# Patient Record
Sex: Female | Born: 1975 | Race: Black or African American | Hispanic: No | Marital: Single | Smoking: Never smoker
Health system: Southern US, Community
[De-identification: ages and names within clinical notes are randomized; demographics above are authoritative.]

## PROBLEM LIST (undated history)

## (undated) DIAGNOSIS — K589 Irritable bowel syndrome without diarrhea: Secondary | ICD-10-CM

## (undated) DIAGNOSIS — G459 Transient cerebral ischemic attack, unspecified: Secondary | ICD-10-CM

## (undated) DIAGNOSIS — D649 Anemia, unspecified: Secondary | ICD-10-CM

## (undated) DIAGNOSIS — K219 Gastro-esophageal reflux disease without esophagitis: Secondary | ICD-10-CM

## (undated) DIAGNOSIS — R51 Headache: Secondary | ICD-10-CM

## (undated) DIAGNOSIS — R519 Headache, unspecified: Secondary | ICD-10-CM

## (undated) DIAGNOSIS — I471 Supraventricular tachycardia, unspecified: Secondary | ICD-10-CM

## (undated) DIAGNOSIS — I10 Essential (primary) hypertension: Secondary | ICD-10-CM

## (undated) DIAGNOSIS — I499 Cardiac arrhythmia, unspecified: Secondary | ICD-10-CM

## (undated) DIAGNOSIS — Z9889 Other specified postprocedural states: Secondary | ICD-10-CM

## (undated) DIAGNOSIS — Z9289 Personal history of other medical treatment: Secondary | ICD-10-CM

## (undated) DIAGNOSIS — F419 Anxiety disorder, unspecified: Secondary | ICD-10-CM

## (undated) DIAGNOSIS — N186 End stage renal disease: Secondary | ICD-10-CM

## (undated) DIAGNOSIS — R0602 Shortness of breath: Secondary | ICD-10-CM

## (undated) DIAGNOSIS — R079 Chest pain, unspecified: Secondary | ICD-10-CM

## (undated) DIAGNOSIS — Z992 Dependence on renal dialysis: Secondary | ICD-10-CM

## (undated) DIAGNOSIS — R011 Cardiac murmur, unspecified: Secondary | ICD-10-CM

## (undated) DIAGNOSIS — E7800 Pure hypercholesterolemia, unspecified: Secondary | ICD-10-CM

## (undated) DIAGNOSIS — M329 Systemic lupus erythematosus, unspecified: Secondary | ICD-10-CM

## (undated) DIAGNOSIS — I639 Cerebral infarction, unspecified: Secondary | ICD-10-CM

## (undated) DIAGNOSIS — R55 Syncope and collapse: Secondary | ICD-10-CM

## (undated) DIAGNOSIS — M1990 Unspecified osteoarthritis, unspecified site: Secondary | ICD-10-CM

## (undated) DIAGNOSIS — M797 Fibromyalgia: Secondary | ICD-10-CM

## (undated) DIAGNOSIS — G4730 Sleep apnea, unspecified: Secondary | ICD-10-CM

## (undated) HISTORY — DX: Systemic lupus erythematosus, unspecified: M32.9

---

## 2002-10-18 ENCOUNTER — Emergency Department (HOSPITAL_COMMUNITY): Admission: EM | Admit: 2002-10-18 | Discharge: 2002-10-18 | Payer: Self-pay | Admitting: Emergency Medicine

## 2003-09-05 ENCOUNTER — Emergency Department (HOSPITAL_COMMUNITY): Admission: EM | Admit: 2003-09-05 | Discharge: 2003-09-05 | Payer: Self-pay | Admitting: Family Medicine

## 2005-03-18 ENCOUNTER — Emergency Department (HOSPITAL_COMMUNITY): Admission: EM | Admit: 2005-03-18 | Discharge: 2005-03-19 | Payer: Self-pay | Admitting: Emergency Medicine

## 2006-09-18 ENCOUNTER — Emergency Department (HOSPITAL_COMMUNITY): Admission: EM | Admit: 2006-09-18 | Discharge: 2006-09-18 | Payer: Self-pay | Admitting: Emergency Medicine

## 2007-11-18 ENCOUNTER — Inpatient Hospital Stay (HOSPITAL_COMMUNITY): Admission: AD | Admit: 2007-11-18 | Discharge: 2007-11-18 | Payer: Self-pay | Admitting: Obstetrics & Gynecology

## 2007-12-24 ENCOUNTER — Emergency Department (HOSPITAL_COMMUNITY): Admission: EM | Admit: 2007-12-24 | Discharge: 2007-12-24 | Payer: Self-pay | Admitting: Emergency Medicine

## 2008-03-19 ENCOUNTER — Inpatient Hospital Stay (HOSPITAL_COMMUNITY): Admission: AD | Admit: 2008-03-19 | Discharge: 2008-03-20 | Payer: Self-pay | Admitting: Obstetrics

## 2008-03-30 HISTORY — PX: TUBAL LIGATION: SHX77

## 2008-06-14 ENCOUNTER — Ambulatory Visit: Payer: Self-pay | Admitting: Obstetrics and Gynecology

## 2008-06-14 ENCOUNTER — Inpatient Hospital Stay (HOSPITAL_COMMUNITY): Admission: AD | Admit: 2008-06-14 | Discharge: 2008-06-14 | Payer: Self-pay | Admitting: Obstetrics

## 2008-06-22 ENCOUNTER — Encounter (INDEPENDENT_AMBULATORY_CARE_PROVIDER_SITE_OTHER): Payer: Self-pay | Admitting: Obstetrics

## 2008-06-22 ENCOUNTER — Inpatient Hospital Stay (HOSPITAL_COMMUNITY): Admission: AD | Admit: 2008-06-22 | Discharge: 2008-06-25 | Payer: Self-pay | Admitting: Obstetrics

## 2008-07-21 ENCOUNTER — Emergency Department (HOSPITAL_COMMUNITY): Admission: EM | Admit: 2008-07-21 | Discharge: 2008-07-21 | Payer: Self-pay | Admitting: Family Medicine

## 2009-03-07 ENCOUNTER — Emergency Department (HOSPITAL_COMMUNITY): Admission: EM | Admit: 2009-03-07 | Discharge: 2009-03-07 | Payer: Self-pay | Admitting: Family Medicine

## 2009-03-30 HISTORY — PX: APPENDECTOMY: SHX54

## 2009-08-21 ENCOUNTER — Emergency Department (HOSPITAL_COMMUNITY): Admission: EM | Admit: 2009-08-21 | Discharge: 2009-08-21 | Payer: Self-pay | Admitting: Family Medicine

## 2009-08-21 ENCOUNTER — Ambulatory Visit (HOSPITAL_COMMUNITY): Admission: EM | Admit: 2009-08-21 | Discharge: 2009-08-22 | Payer: Self-pay | Admitting: Emergency Medicine

## 2009-09-03 ENCOUNTER — Emergency Department (HOSPITAL_COMMUNITY): Admission: EM | Admit: 2009-09-03 | Discharge: 2009-09-04 | Payer: Self-pay | Admitting: Emergency Medicine

## 2010-01-16 ENCOUNTER — Emergency Department (HOSPITAL_COMMUNITY): Admission: EM | Admit: 2010-01-16 | Discharge: 2010-01-16 | Payer: Self-pay | Admitting: Emergency Medicine

## 2010-03-06 ENCOUNTER — Emergency Department (HOSPITAL_COMMUNITY): Admission: EM | Admit: 2010-03-06 | Discharge: 2009-09-08 | Payer: Self-pay | Admitting: Emergency Medicine

## 2010-04-01 ENCOUNTER — Emergency Department (HOSPITAL_COMMUNITY)
Admission: EM | Admit: 2010-04-01 | Discharge: 2010-04-01 | Payer: Self-pay | Source: Home / Self Care | Admitting: Family Medicine

## 2010-04-28 ENCOUNTER — Emergency Department (HOSPITAL_COMMUNITY)
Admission: EM | Admit: 2010-04-28 | Discharge: 2010-04-28 | Payer: Self-pay | Source: Home / Self Care | Admitting: Family Medicine

## 2010-05-01 ENCOUNTER — Inpatient Hospital Stay (INDEPENDENT_AMBULATORY_CARE_PROVIDER_SITE_OTHER)
Admission: RE | Admit: 2010-05-01 | Discharge: 2010-05-01 | Disposition: A | Discharge status: Home / Self Care | Payer: 59 | Source: Ambulatory Visit | Attending: Emergency Medicine | Admitting: Emergency Medicine

## 2010-05-01 DIAGNOSIS — H9209 Otalgia, unspecified ear: Secondary | ICD-10-CM

## 2010-06-05 ENCOUNTER — Emergency Department (HOSPITAL_COMMUNITY): Payer: 59

## 2010-06-05 ENCOUNTER — Emergency Department (HOSPITAL_COMMUNITY)
Admission: EM | Admit: 2010-06-05 | Discharge: 2010-06-05 | Disposition: A | Discharge status: Home / Self Care | Payer: 59 | Attending: Emergency Medicine | Admitting: Emergency Medicine

## 2010-06-05 DIAGNOSIS — R0989 Other specified symptoms and signs involving the circulatory and respiratory systems: Secondary | ICD-10-CM | POA: Insufficient documentation

## 2010-06-05 DIAGNOSIS — F411 Generalized anxiety disorder: Secondary | ICD-10-CM | POA: Insufficient documentation

## 2010-06-05 DIAGNOSIS — R002 Palpitations: Secondary | ICD-10-CM | POA: Insufficient documentation

## 2010-06-05 DIAGNOSIS — Z79899 Other long term (current) drug therapy: Secondary | ICD-10-CM | POA: Insufficient documentation

## 2010-06-05 DIAGNOSIS — R079 Chest pain, unspecified: Secondary | ICD-10-CM | POA: Insufficient documentation

## 2010-06-05 DIAGNOSIS — F410 Panic disorder [episodic paroxysmal anxiety] without agoraphobia: Secondary | ICD-10-CM | POA: Insufficient documentation

## 2010-06-05 DIAGNOSIS — R0602 Shortness of breath: Secondary | ICD-10-CM | POA: Insufficient documentation

## 2010-06-05 DIAGNOSIS — M542 Cervicalgia: Secondary | ICD-10-CM | POA: Insufficient documentation

## 2010-06-05 DIAGNOSIS — I10 Essential (primary) hypertension: Secondary | ICD-10-CM | POA: Insufficient documentation

## 2010-06-05 DIAGNOSIS — M545 Low back pain, unspecified: Secondary | ICD-10-CM | POA: Insufficient documentation

## 2010-06-05 DIAGNOSIS — R55 Syncope and collapse: Secondary | ICD-10-CM | POA: Insufficient documentation

## 2010-06-05 DIAGNOSIS — R0609 Other forms of dyspnea: Secondary | ICD-10-CM | POA: Insufficient documentation

## 2010-06-05 DIAGNOSIS — R259 Unspecified abnormal involuntary movements: Secondary | ICD-10-CM | POA: Insufficient documentation

## 2010-06-05 LAB — POCT PREGNANCY, URINE: Preg Test, Ur: NEGATIVE

## 2010-06-05 LAB — CBC
HCT: 34.7 % — ABNORMAL LOW (ref 36.0–46.0)
Hemoglobin: 11.3 g/dL — ABNORMAL LOW (ref 12.0–15.0)
MCH: 28.6 pg (ref 26.0–34.0)
MCHC: 32.6 g/dL (ref 30.0–36.0)
MCV: 87.8 fL (ref 78.0–100.0)
Platelets: 160 10*3/uL (ref 150–400)
RBC: 3.95 MIL/uL (ref 3.87–5.11)
RDW: 15.3 % (ref 11.5–15.5)
WBC: 2.8 10*3/uL — ABNORMAL LOW (ref 4.0–10.5)

## 2010-06-05 LAB — DIFFERENTIAL
Basophils Absolute: 0 10*3/uL (ref 0.0–0.1)
Basophils Relative: 1 % (ref 0–1)
Eosinophils Absolute: 0.1 10*3/uL (ref 0.0–0.7)
Eosinophils Relative: 2 % (ref 0–5)
Lymphocytes Relative: 26 % (ref 12–46)
Lymphs Abs: 0.7 10*3/uL (ref 0.7–4.0)
Monocytes Absolute: 0.2 10*3/uL (ref 0.1–1.0)
Monocytes Relative: 8 % (ref 3–12)
Neutro Abs: 1.8 10*3/uL (ref 1.7–7.7)
Neutrophils Relative %: 63 % (ref 43–77)

## 2010-06-05 LAB — POCT I-STAT, CHEM 8
BUN: 8 mg/dL (ref 6–23)
Calcium, Ion: 1.18 mmol/L (ref 1.12–1.32)
Chloride: 103 mEq/L (ref 96–112)
Creatinine, Ser: 0.9 mg/dL (ref 0.4–1.2)
Glucose, Bld: 96 mg/dL (ref 70–99)
HCT: 35 % — ABNORMAL LOW (ref 36.0–46.0)
Hemoglobin: 11.9 g/dL — ABNORMAL LOW (ref 12.0–15.0)
Potassium: 3.6 mEq/L (ref 3.5–5.1)
Sodium: 141 mEq/L (ref 135–145)
TCO2: 26 mmol/L (ref 0–100)

## 2010-06-11 LAB — POCT I-STAT, CHEM 8
BUN: 7 mg/dL (ref 6–23)
Calcium, Ion: 1.15 mmol/L (ref 1.12–1.32)
Chloride: 108 mEq/L (ref 96–112)
Creatinine, Ser: 0.9 mg/dL (ref 0.4–1.2)
Glucose, Bld: 100 mg/dL — ABNORMAL HIGH (ref 70–99)
HCT: 37 % (ref 36.0–46.0)
Hemoglobin: 12.6 g/dL (ref 12.0–15.0)
Potassium: 4.2 mEq/L (ref 3.5–5.1)
Sodium: 143 mEq/L (ref 135–145)
TCO2: 25 mmol/L (ref 0–100)

## 2010-06-11 LAB — CBC
HCT: 34.4 % — ABNORMAL LOW (ref 36.0–46.0)
Hemoglobin: 10.9 g/dL — ABNORMAL LOW (ref 12.0–15.0)
MCH: 25.4 pg — ABNORMAL LOW (ref 26.0–34.0)
MCHC: 31.7 g/dL (ref 30.0–36.0)
MCV: 80.2 fL (ref 78.0–100.0)
Platelets: 242 10*3/uL (ref 150–400)
RBC: 4.29 MIL/uL (ref 3.87–5.11)
RDW: 17.7 % — ABNORMAL HIGH (ref 11.5–15.5)
WBC: 2.8 10*3/uL — ABNORMAL LOW (ref 4.0–10.5)

## 2010-06-11 LAB — RAPID URINE DRUG SCREEN, HOSP PERFORMED
Amphetamines: NOT DETECTED
Barbiturates: NOT DETECTED
Benzodiazepines: NOT DETECTED
Cocaine: NOT DETECTED
Opiates: NOT DETECTED
Tetrahydrocannabinol: NOT DETECTED

## 2010-06-11 LAB — POCT CARDIAC MARKERS
CKMB, poc: 6.1 ng/mL (ref 1.0–8.0)
CKMB, poc: 7.7 ng/mL (ref 1.0–8.0)
Myoglobin, poc: 235 ng/mL (ref 12–200)
Myoglobin, poc: 270 ng/mL (ref 12–200)
Troponin i, poc: <0.05 ng/mL (ref 0.00–0.09)
Troponin i, poc: <0.05 ng/mL (ref 0.00–0.09)

## 2010-06-11 LAB — D-DIMER, QUANTITATIVE: D-Dimer, Quant: 0.53 ug/mL-FEU — ABNORMAL HIGH (ref 0.00–0.48)

## 2010-06-16 LAB — URINALYSIS, ROUTINE W REFLEX MICROSCOPIC
Bilirubin Urine: NEGATIVE
Glucose, UA: NEGATIVE mg/dL
Glucose, UA: NEGATIVE mg/dL
Ketones, ur: 15 mg/dL — AB
Leukocytes, UA: NEGATIVE
Nitrite: NEGATIVE
Nitrite: NEGATIVE
Protein, ur: NEGATIVE mg/dL
Specific Gravity, Urine: 1.013 (ref 1.005–1.030)
Specific Gravity, Urine: 1.026 (ref 1.005–1.030)
Urobilinogen, UA: 1 mg/dL (ref 0.0–1.0)
pH: 5.5 (ref 5.0–8.0)
pH: 6.5 (ref 5.0–8.0)

## 2010-06-16 LAB — COMPREHENSIVE METABOLIC PANEL
Albumin: 3.8 g/dL (ref 3.5–5.2)
Alkaline Phosphatase: 50 U/L (ref 39–117)
Alkaline Phosphatase: 48 U/L (ref 39–117)
BUN: 7 mg/dL (ref 6–23)
BUN: 8 mg/dL (ref 6–23)
CO2: 24 mEq/L (ref 19–32)
CO2: 24 mEq/L (ref 19–32)
Chloride: 106 mEq/L (ref 96–112)
Chloride: 106 mEq/L (ref 96–112)
Creatinine, Ser: 0.71 mg/dL (ref 0.4–1.2)
GFR calc non Af Amer: >60 mL/min (ref >60)
GFR calc non Af Amer: >60 mL/min (ref >60)
Glucose, Bld: 100 mg/dL — ABNORMAL HIGH (ref 70–99)
Potassium: 3.2 mEq/L — ABNORMAL LOW (ref 3.5–5.1)
Potassium: 3.1 mEq/L — ABNORMAL LOW (ref 3.5–5.1)
Total Bilirubin: 0.6 mg/dL (ref 0.3–1.2)
Total Bilirubin: 0.5 mg/dL (ref 0.3–1.2)

## 2010-06-16 LAB — DIFFERENTIAL
Basophils Absolute: 0 10*3/uL (ref 0.0–0.1)
Basophils Absolute: 0.1 10*3/uL (ref 0.0–0.1)
Basophils Absolute: 0 10*3/uL (ref 0.0–0.1)
Basophils Relative: 1 % (ref 0–1)
Basophils Relative: 0 % (ref 0–1)
Eosinophils Relative: 1 % (ref 0–5)
Eosinophils Relative: 0 % (ref 0–5)
Lymphocytes Relative: 24 % (ref 12–46)
Lymphocytes Relative: 8 % — ABNORMAL LOW (ref 12–46)
Lymphs Abs: 0.4 10*3/uL — ABNORMAL LOW (ref 0.7–4.0)
Monocytes Absolute: 0.4 10*3/uL (ref 0.1–1.0)
Monocytes Absolute: 0.3 10*3/uL (ref 0.1–1.0)
Neutro Abs: 3 10*3/uL (ref 1.7–7.7)
Neutro Abs: 4.4 10*3/uL (ref 1.7–7.7)
Neutro Abs: 5.4 10*3/uL (ref 1.7–7.7)
Neutrophils Relative %: 64 % (ref 43–77)

## 2010-06-16 LAB — CBC
HCT: 33.4 % — ABNORMAL LOW (ref 36.0–46.0)
HCT: 33.6 % — ABNORMAL LOW (ref 36.0–46.0)
HCT: 30.7 % — ABNORMAL LOW (ref 36.0–46.0)
Hemoglobin: 11.1 g/dL — ABNORMAL LOW (ref 12.0–15.0)
Hemoglobin: 11.2 g/dL — ABNORMAL LOW (ref 12.0–15.0)
Hemoglobin: 10.3 g/dL — ABNORMAL LOW (ref 12.0–15.0)
MCV: 79.6 fL (ref 78.0–100.0)
MCV: 79.4 fL (ref 78.0–100.0)
Platelets: 366 10*3/uL (ref 150–400)
Platelets: 488 10*3/uL — ABNORMAL HIGH (ref 150–400)
Platelets: 292 10*3/uL (ref 150–400)
RBC: 4.19 MIL/uL (ref 3.87–5.11)
RBC: 3.87 MIL/uL (ref 3.87–5.11)
RDW: 16.3 % — ABNORMAL HIGH (ref 11.5–15.5)
WBC: 4.6 10*3/uL (ref 4.0–10.5)
WBC: 5.4 10*3/uL (ref 4.0–10.5)
WBC: 6.1 10*3/uL (ref 4.0–10.5)

## 2010-06-16 LAB — PREGNANCY, URINE: Preg Test, Ur: NEGATIVE

## 2010-06-16 LAB — BASIC METABOLIC PANEL
Calcium: 9 mg/dL (ref 8.4–10.5)
GFR calc non Af Amer: >60 mL/min (ref >60)
Glucose, Bld: 105 mg/dL — ABNORMAL HIGH (ref 70–99)
Potassium: 3.3 mEq/L — ABNORMAL LOW (ref 3.5–5.1)
Sodium: 135 mEq/L (ref 135–145)

## 2010-06-16 LAB — CULTURE, ROUTINE-ABSCESS

## 2010-06-16 LAB — URINE MICROSCOPIC-ADD ON

## 2010-06-16 LAB — POCT URINALYSIS DIP (DEVICE)
Bilirubin Urine: NEGATIVE
Glucose, UA: NEGATIVE mg/dL
Hgb urine dipstick: NEGATIVE
Ketones, ur: NEGATIVE mg/dL
Specific Gravity, Urine: >=1.03 (ref 1.005–1.030)
Urobilinogen, UA: 0.2 mg/dL (ref 0.0–1.0)

## 2010-06-16 LAB — GRAM STAIN

## 2010-06-16 LAB — LIPASE, BLOOD: Lipase: 20 U/L (ref 11–59)

## 2010-06-16 LAB — POCT PREGNANCY, URINE: Preg Test, Ur: NEGATIVE

## 2010-07-10 LAB — CBC
MCHC: 32.5 g/dL (ref 30.0–36.0)
MCV: 81.2 fL (ref 78.0–100.0)
Platelets: 318 10*3/uL (ref 150–400)
Platelets: 387 10*3/uL (ref 150–400)
RDW: 19.1 % — ABNORMAL HIGH (ref 11.5–15.5)
WBC: 8.2 10*3/uL (ref 4.0–10.5)

## 2010-07-10 LAB — RPR: RPR Ser Ql: NONREACTIVE

## 2010-08-12 NOTE — Discharge Summary (Signed)
NAMEVALLIE, FAYETTE NO.:  [id number]   MEDICAL RECORD NO.:  [id number]          PATIENT TYPE:  INP   LOCATION:  9121                          FACILITY:  [facility]   PHYSICIAN:  Kathreen Cosier, M.D.DATE OF BIRTH:  [birth year]   DATE OF ADMISSION:  06/22/2008  DATE OF DISCHARGE:  06/25/2008                               DISCHARGE SUMMARY   The patient is a 35 year old gravida 3, para 1-0-1-1 had a previous C-  section.  [facility] July 09, 2008.  She was admitted in labor, desired repeat  C-section and tubal ligation.  She underwent repeat C-section and had a  female 6 pounds 4 ounces, Apgar 9 and 9.  She also had postpartum tubal  ligation done.  Postoperatively, she did well.  Her hemoglobin was 8.6.  RPR negative.  HIV negative.  She was discharged on the third  postoperative day ambulatory and on a regular diet.  To see me in 6  weeks.   DISCHARGE DIAGNOSES:  Status post repeat low transverse cesarean section  and tubal ligation.           ______________________________  Kathreen Cosier, M.D.     BAM/MEDQ  D:  06/25/2008  T:  06/25/2008  Job:  [id number]

## 2010-08-12 NOTE — Op Note (Signed)
[name], CREEGAN NO.:  [id number]   MEDICAL RECORD NO.:  [id number]          PATIENT TYPE:  INP   LOCATION:  9121                          FACILITY:  [facility]   PHYSICIAN:  Kathreen Cosier, M.D.DATE OF BIRTH:  1975-10-25   DATE OF PROCEDURE:  06/22/2008  DATE OF DISCHARGE:                               OPERATIVE REPORT   PREOPERATIVE DIAGNOSIS:  Previous cesarean section at term in labor,  desires repeat and tubal ligation for multiparity.   SURGEON:  Kathreen Cosier, MD   FIRST ASSISTANT:  Charles A. Clearance Coots, MD   ANESTHESIA:  Spinal.   PROCEDURE:  The patient was placed in the operating table in supine  position after the spinal administered.  Abdomen was prepped and draped.  Bladder was emptied with Foley catheter.  Transverse suprapubic incision  was made through the old scar, carried down to the rectus fascia.  Fascia incised at the length of incision.  Recti muscles were retracted  laterally.  Peritoneum was incised longitudinally.  Transverse incision  made at visceral peritoneum above the bladder.  Bladder mobilized  inferiorly.  Transverse lower uterine incision made.  The fluid was  clear.  The patient delivered from the LOA position of a female, Apgars  9 and 9, weighing 6 pounds 4 ounces.  The team was in attendance.  Fluid  was clear.  Placenta was posterior and removed manually, and sent to  Labor and Delivery.  Uterine cavity was cleaned with dry laps.  Uterine  incision closed in one layer with continuous suture of #1 chromic  including  peritoneum and myometrium.  Hemostasis was satisfactory.  Right tube grasped at the mid portion with Babcock clamp, 0 plain suture  placed at the mesosalpinx below the portion of the tube and this clamp.  This was tied with and approximately, 1 inch of tube transected.  Procedure was done in the similar fashion on the other side.  Lap and  sponge counts correct.  Abdomen was closed in layers.   Peritoneum  continuous suture of 0 chromic, fascia continuous suture of 0 Dexon.  Skin closed with subcuticular stitch of 4-0 Monocryl.  Blood loss 600  mL.           ______________________________  Kathreen Cosier, M.D.     BAM/MEDQ  D:  06/22/2008  T:  06/23/2008  Job:  [id number]

## 2010-08-24 ENCOUNTER — Emergency Department (HOSPITAL_COMMUNITY): Payer: 59

## 2010-08-24 ENCOUNTER — Emergency Department (HOSPITAL_COMMUNITY)
Admission: EM | Admit: 2010-08-24 | Discharge: 2010-08-24 | Disposition: A | Discharge status: Home / Self Care | Payer: 59 | Attending: Emergency Medicine | Admitting: Emergency Medicine

## 2010-08-24 DIAGNOSIS — R0602 Shortness of breath: Secondary | ICD-10-CM | POA: Insufficient documentation

## 2010-08-24 DIAGNOSIS — R002 Palpitations: Secondary | ICD-10-CM | POA: Insufficient documentation

## 2010-08-24 DIAGNOSIS — R0789 Other chest pain: Secondary | ICD-10-CM | POA: Insufficient documentation

## 2010-08-24 DIAGNOSIS — R42 Dizziness and giddiness: Secondary | ICD-10-CM | POA: Insufficient documentation

## 2010-08-24 DIAGNOSIS — A599 Trichomoniasis, unspecified: Secondary | ICD-10-CM | POA: Insufficient documentation

## 2010-08-24 DIAGNOSIS — I10 Essential (primary) hypertension: Secondary | ICD-10-CM | POA: Insufficient documentation

## 2010-08-24 DIAGNOSIS — R55 Syncope and collapse: Secondary | ICD-10-CM | POA: Insufficient documentation

## 2010-08-24 DIAGNOSIS — I517 Cardiomegaly: Secondary | ICD-10-CM | POA: Insufficient documentation

## 2010-08-24 LAB — CK TOTAL AND CKMB (NOT AT ARMC)
CK, MB: 22 ng/mL (ref 0.3–4.0)
Relative Index: 1.7 (ref 0.0–2.5)
Total CK: 1298 U/L — ABNORMAL HIGH (ref 7–177)

## 2010-08-24 LAB — CBC
HCT: 31.6 % — ABNORMAL LOW (ref 36.0–46.0)
Hemoglobin: 10.6 g/dL — ABNORMAL LOW (ref 12.0–15.0)
RBC: 3.52 MIL/uL — ABNORMAL LOW (ref 3.87–5.11)
RDW: 14 % (ref 11.5–15.5)
WBC: 2.9 10*3/uL — ABNORMAL LOW (ref 4.0–10.5)

## 2010-08-24 LAB — URINE MICROSCOPIC-ADD ON

## 2010-08-24 LAB — URINALYSIS, ROUTINE W REFLEX MICROSCOPIC
Glucose, UA: NEGATIVE mg/dL
Specific Gravity, Urine: 1.01 (ref 1.005–1.030)
Urobilinogen, UA: 1 mg/dL (ref 0.0–1.0)

## 2010-08-24 LAB — MAGNESIUM: Magnesium: 1.9 mg/dL (ref 1.5–2.5)

## 2010-08-24 LAB — DIFFERENTIAL
Basophils Absolute: 0 10*3/uL (ref 0.0–0.1)
Lymphocytes Relative: 24 % (ref 12–46)
Neutro Abs: 1.8 10*3/uL (ref 1.7–7.7)

## 2010-08-24 LAB — COMPREHENSIVE METABOLIC PANEL
ALT: 39 U/L — ABNORMAL HIGH (ref 0–35)
BUN: 9 mg/dL (ref 6–23)
Calcium: 9 mg/dL (ref 8.4–10.5)
Glucose, Bld: 103 mg/dL — ABNORMAL HIGH (ref 70–99)
Sodium: 138 mEq/L (ref 135–145)
Total Protein: 7.7 g/dL (ref 6.0–8.3)

## 2010-08-24 LAB — TROPONIN I: Troponin I: <0.3 ng/mL (ref <0.30)

## 2010-12-04 ENCOUNTER — Emergency Department (HOSPITAL_COMMUNITY)
Admission: EM | Admit: 2010-12-04 | Discharge: 2010-12-04 | Disposition: A | Discharge status: Home / Self Care | Payer: 59 | Attending: Emergency Medicine | Admitting: Emergency Medicine

## 2010-12-04 ENCOUNTER — Emergency Department (HOSPITAL_COMMUNITY): Payer: 59

## 2010-12-04 DIAGNOSIS — R070 Pain in throat: Secondary | ICD-10-CM | POA: Insufficient documentation

## 2010-12-04 DIAGNOSIS — R002 Palpitations: Secondary | ICD-10-CM | POA: Insufficient documentation

## 2010-12-04 DIAGNOSIS — J40 Bronchitis, not specified as acute or chronic: Secondary | ICD-10-CM | POA: Insufficient documentation

## 2010-12-04 DIAGNOSIS — E669 Obesity, unspecified: Secondary | ICD-10-CM | POA: Insufficient documentation

## 2010-12-04 DIAGNOSIS — M940 Chondrocostal junction syndrome [Tietze]: Secondary | ICD-10-CM | POA: Insufficient documentation

## 2010-12-04 DIAGNOSIS — R059 Cough, unspecified: Secondary | ICD-10-CM | POA: Insufficient documentation

## 2010-12-04 DIAGNOSIS — I10 Essential (primary) hypertension: Secondary | ICD-10-CM | POA: Insufficient documentation

## 2010-12-04 DIAGNOSIS — F411 Generalized anxiety disorder: Secondary | ICD-10-CM | POA: Insufficient documentation

## 2010-12-04 DIAGNOSIS — R05 Cough: Secondary | ICD-10-CM | POA: Insufficient documentation

## 2010-12-04 DIAGNOSIS — R079 Chest pain, unspecified: Secondary | ICD-10-CM | POA: Insufficient documentation

## 2010-12-04 DIAGNOSIS — R0602 Shortness of breath: Secondary | ICD-10-CM | POA: Insufficient documentation

## 2011-03-16 ENCOUNTER — Encounter: Payer: Self-pay | Admitting: *Deleted

## 2011-03-16 ENCOUNTER — Emergency Department (INDEPENDENT_AMBULATORY_CARE_PROVIDER_SITE_OTHER)
Admission: EM | Admit: 2011-03-16 | Discharge: 2011-03-16 | Disposition: A | Discharge status: Home / Self Care | Payer: 59 | Source: Home / Self Care | Attending: Family Medicine | Admitting: Family Medicine

## 2011-03-16 DIAGNOSIS — J111 Influenza due to unidentified influenza virus with other respiratory manifestations: Secondary | ICD-10-CM

## 2011-03-16 HISTORY — DX: Essential (primary) hypertension: I10

## 2011-03-16 MED ORDER — GUAIFENESIN-CODEINE 100-10 MG/5ML PO SYRP: 10.0000 mL | ORAL_SOLUTION | Freq: Four times a day (QID) | ORAL | Status: AC | PRN

## 2011-03-16 NOTE — ED Notes (Signed)
C/O fever, HA, body aches, sore throat, cough x 2 days.  Has been taking Tyl.

## 2011-03-16 NOTE — ED Provider Notes (Signed)
History     CSN: [id number] Arrival date & time: 03/16/2011  8:31 PM   First MD Initiated Contact with Patient 03/16/11 1914      Chief Complaint  Patient presents with  . Fever  . Generalized Body Aches  . Cough    (Consider location/radiation/quality/duration/timing/severity/associated sxs/prior treatment) Patient is a 35 y.o. female presenting with fever and cough. The history is provided by the patient.  Fever Primary symptoms of the febrile illness include fever, cough and myalgias. Primary symptoms do not include nausea, vomiting, diarrhea or dysuria. The current episode started 2 days ago. This is a new problem. The problem has been gradually worsening.  Cough Associated symptoms include rhinorrhea and myalgias.    Past Medical History  Diagnosis Date  . Hypertension     Past Surgical History  Procedure Date  . Appendectomy     No family history on file.  History  Substance Use Topics  . Smoking status: Never Smoker   . Smokeless tobacco: Not on file  . Alcohol Use: No    OB History    Grav Para Term Preterm Abortions TAB SAB Ect Mult Living                  Review of Systems  Constitutional: Positive for fever.  HENT: Positive for congestion, rhinorrhea and postnasal drip.   Respiratory: Positive for cough.   Gastrointestinal: Negative for nausea, vomiting and diarrhea.  Genitourinary: Negative.  Negative for dysuria.  Musculoskeletal: Positive for myalgias.    Allergies  Review of patient's allergies indicates no known allergies.  Home Medications   Current Outpatient Rx  Name Route Sig Dispense Refill  . DILTIAZEM HCL 120 MG PO TABS Oral Take 120 mg by mouth daily.      Marland Kitchen LISINOPRIL PO Oral Take by mouth daily.      . GUAIFENESIN-CODEINE 100-10 MG/5ML PO SYRP Oral Take 10 mLs by mouth 4 (four) times daily as needed for cough. 180 mL 0    BP 146/80  Pulse 90  Temp(Src) 98.6 F (37 C) (Oral)  Resp 20  SpO2 100%  LMP  02/24/2011  Physical Exam  Nursing note and vitals reviewed. Constitutional: She is oriented to person, place, and time. She appears well-developed and well-nourished.  HENT:  Head: Normocephalic.  Right Ear: External ear normal.  Left Ear: External ear normal.  Mouth/Throat: Oropharynx is clear and moist.  Eyes: Pupils are equal, round, and reactive to light.  Neck: Normal range of motion. Neck supple.  Cardiovascular: Normal rate, normal heart sounds and intact distal pulses.   Pulmonary/Chest: Effort normal and breath sounds normal.  Abdominal: Soft. Bowel sounds are normal.  Neurological: She is alert and oriented to person, place, and time.  Skin: Skin is warm and dry.    ED Course  Procedures (including critical care time)  Labs Reviewed - No data to display No results found.   1. Influenza       MDM          Barkley Bruns, MD 03/16/11 2052

## 2011-03-31 HISTORY — PX: CERVICAL BIOPSY: SHX590

## 2011-05-08 ENCOUNTER — Emergency Department (HOSPITAL_COMMUNITY)
Admission: EM | Admit: 2011-05-08 | Discharge: 2011-05-08 | Disposition: A | Discharge status: Home / Self Care | Payer: BC Managed Care – PPO | Source: Home / Self Care | Attending: Family Medicine | Admitting: Family Medicine

## 2011-05-08 ENCOUNTER — Encounter (HOSPITAL_COMMUNITY): Payer: Self-pay | Admitting: Emergency Medicine

## 2011-05-08 DIAGNOSIS — J069 Acute upper respiratory infection, unspecified: Secondary | ICD-10-CM

## 2011-05-08 MED ORDER — AZITHROMYCIN 250 MG PO TABS: ORAL_TABLET | ORAL | Status: AC

## 2011-05-08 MED ORDER — GUAIFENESIN ER 600 MG PO TB12: 1200.0000 mg | ORAL_TABLET | Freq: Two times a day (BID) | ORAL | Status: DC

## 2011-05-08 NOTE — ED Notes (Signed)
C/O POST NASAL DRAINAGE S/P SINUS INFECTION X 2WEEKS AGO AND YESTERDAY THROB BILAT EAR PAIN WITH CLOGGING AND SORE THROAT,DIFF SWALLOWING SALIVA.AFEBRILE.PT HAS BEEN TAKING OTC SINUS MEDS BUT NO RELIEF

## 2011-05-08 NOTE — ED Provider Notes (Signed)
History     CSN: [id number]  Arrival date & time 05/08/11  0805   First MD Initiated Contact with Patient 05/08/11 [phone]      Chief Complaint  Patient presents with  . Sinusitis  . Otalgia    (Consider location/radiation/quality/duration/timing/severity/associated sxs/prior treatment) Patient is a 36 y.o. female presenting with sinusitis and ear pain. The history is provided by the patient.  Sinusitis  This is a new problem. The current episode started more than 1 week ago (2wk h/o congestion). The problem has been gradually worsening. There has been no fever. The pain is mild. Associated symptoms include congestion, ear pain, sinus pressure and cough.  Otalgia Associated symptoms include rhinorrhea and cough.    Past Medical History  Diagnosis Date  . Hypertension     Past Surgical History  Procedure Date  . Appendectomy     No family history on file.  History  Substance Use Topics  . Smoking status: Never Smoker   . Smokeless tobacco: Not on file  . Alcohol Use: No    OB History    Grav Para Term Preterm Abortions TAB SAB Ect Mult Living                  Review of Systems  Constitutional: Negative.   HENT: Positive for ear pain, congestion, rhinorrhea and sinus pressure.   Respiratory: Positive for cough.   Gastrointestinal: Negative.     Allergies  Review of patient's allergies indicates no known allergies.  Home Medications   Current Outpatient Rx  Name Route Sig Dispense Refill  . AZITHROMYCIN 250 MG PO TABS  Take as directed on pack 6 each 0  . DILTIAZEM HCL 120 MG PO TABS Oral Take 120 mg by mouth daily.      . GUAIFENESIN ER 600 MG PO TB12 Oral Take 2 tablets (1,200 mg total) by mouth 2 (two) times daily. 60 tablet 1  . LISINOPRIL PO Oral Take by mouth daily.        BP 125/81  Pulse 91  Temp(Src) 97 F (36.1 C) (Oral)  Resp 16  SpO2 96%  Physical Exam  Nursing note and vitals reviewed. Constitutional: She is oriented to person, place,  and time. She appears well-developed and well-nourished.  HENT:  Head: Normocephalic.  Right Ear: External ear normal.  Left Ear: External ear normal.  Nose: Nose normal.  Mouth/Throat: Oropharynx is clear and moist.  Eyes: Conjunctivae are normal. Pupils are equal, round, and reactive to light.  Neck: Normal range of motion. Neck supple.  Cardiovascular: Normal rate, normal heart sounds and intact distal pulses.   Pulmonary/Chest: Effort normal and breath sounds normal.  Lymphadenopathy:    She has no cervical adenopathy.  Neurological: She is alert and oriented to person, place, and time.  Skin: Skin is warm and dry.  Psychiatric: She has a normal mood and affect.    ED Course  Procedures (including critical care time)  Labs Reviewed - No data to display No results found.   1. URI (upper respiratory infection)       MDM          Barkley Bruns, MD 05/08/11 [phone]

## 2011-07-07 ENCOUNTER — Emergency Department (HOSPITAL_COMMUNITY): Payer: BC Managed Care – PPO

## 2011-07-07 ENCOUNTER — Emergency Department (HOSPITAL_COMMUNITY)
Admission: EM | Admit: 2011-07-07 | Discharge: 2011-07-07 | Disposition: A | Discharge status: Nursing Home (Includes ICF) | Payer: BC Managed Care – PPO | Source: Home / Self Care | Attending: Family Medicine | Admitting: Family Medicine

## 2011-07-07 ENCOUNTER — Emergency Department (HOSPITAL_COMMUNITY)
Admission: EM | Admit: 2011-07-07 | Discharge: 2011-07-07 | Disposition: A | Discharge status: Home / Self Care | Payer: BC Managed Care – PPO | Attending: Emergency Medicine | Admitting: Emergency Medicine

## 2011-07-07 ENCOUNTER — Encounter (HOSPITAL_COMMUNITY): Payer: Self-pay

## 2011-07-07 ENCOUNTER — Encounter (HOSPITAL_COMMUNITY): Payer: Self-pay | Admitting: Emergency Medicine

## 2011-07-07 DIAGNOSIS — Z79899 Other long term (current) drug therapy: Secondary | ICD-10-CM | POA: Insufficient documentation

## 2011-07-07 DIAGNOSIS — R079 Chest pain, unspecified: Secondary | ICD-10-CM | POA: Insufficient documentation

## 2011-07-07 DIAGNOSIS — I10 Essential (primary) hypertension: Secondary | ICD-10-CM | POA: Insufficient documentation

## 2011-07-07 DIAGNOSIS — M79609 Pain in unspecified limb: Secondary | ICD-10-CM | POA: Insufficient documentation

## 2011-07-07 HISTORY — DX: Cardiac arrhythmia, unspecified: I49.9

## 2011-07-07 LAB — CBC
HCT: 36.4 % (ref 36.0–46.0)
Hemoglobin: 11.7 g/dL — ABNORMAL LOW (ref 12.0–15.0)
MCH: 29.3 pg (ref 26.0–34.0)
MCHC: 32.1 g/dL (ref 30.0–36.0)
MCV: 91 fL (ref 78.0–100.0)
Platelets: 148 10*3/uL — ABNORMAL LOW (ref 150–400)
RBC: 4 MIL/uL (ref 3.87–5.11)
RDW: 14.9 % (ref 11.5–15.5)
WBC: 2.7 10*3/uL — ABNORMAL LOW (ref 4.0–10.5)

## 2011-07-07 LAB — BASIC METABOLIC PANEL
BUN: 8 mg/dL (ref 6–23)
CO2: 24 mEq/L (ref 19–32)
Calcium: 9 mg/dL (ref 8.4–10.5)
Chloride: 104 mEq/L (ref 96–112)
Creatinine, Ser: 0.64 mg/dL (ref 0.50–1.10)
GFR calc Af Amer: >90 mL/min (ref >90)
GFR calc non Af Amer: >90 mL/min (ref >90)
Glucose, Bld: 77 mg/dL (ref 70–99)
Potassium: 3.4 mEq/L — ABNORMAL LOW (ref 3.5–5.1)
Sodium: 137 mEq/L (ref 135–145)

## 2011-07-07 LAB — POCT I-STAT TROPONIN I: Troponin i, poc: 0.01 ng/mL (ref 0.00–0.08)

## 2011-07-07 MED ORDER — ASPIRIN 81 MG PO CHEW
324.0000 mg | CHEWABLE_TABLET | Freq: Once | ORAL | Status: AC
  Administered 2011-07-07: 324 mg via ORAL
  Filled 2011-07-07: qty 4

## 2011-07-07 NOTE — Discharge Instructions (Signed)
Transferred to [facility] Emergency Department for evaluation of chest pain.

## 2011-07-07 NOTE — ED Notes (Signed)
C/o intermittent lt side chest pain that she calls "stabbing".  States it resolves as quickly as it comes about.  Also has noted rt arm and jaw pain as well as rt arm twitching.  States she has been having chest pain and the rt arm twitching often over the last several months and has seen her cardiologist.  States pain is different today- states it isn't normally a quick, stabbing pain like this.  Talked with her cardiologist who told her they could see May 1 or if worsening sx to go to ED.  Denies SOB, sweating, n/v or other sx. Deneis pain at present.

## 2011-07-07 NOTE — ED Notes (Signed)
Dr Juanetta Gosling informed of pts hx and complaint, states he has reviewed 12 lead EKG

## 2011-07-07 NOTE — Discharge Instructions (Signed)
Chest Pain (Nonspecific) It is often hard to give a specific diagnosis for the cause of chest pain. There is always a chance that your pain could be related to something serious, such as a heart attack or a blood clot in the lungs. You need to follow up with your caregiver for further evaluation. CAUSES   Heartburn.   Pneumonia or bronchitis.   Anxiety or stress.   Inflammation around your heart (pericarditis) or lung (pleuritis or pleurisy).   A blood clot in the lung.   A collapsed lung (pneumothorax). It can develop suddenly on its own (spontaneous pneumothorax) or from injury (trauma) to the chest.   Shingles infection (herpes zoster virus).  The chest wall is composed of bones, muscles, and cartilage. Any of these can be the source of the pain.  The bones can be bruised by injury.   The muscles or cartilage can be strained by coughing or overwork.   The cartilage can be affected by inflammation and become sore (costochondritis).  DIAGNOSIS  Lab tests or other studies, such as X-rays, electrocardiography, stress testing, or cardiac imaging, may be needed to find the cause of your pain.  TREATMENT   Treatment depends on what may be causing your chest pain. Treatment may include:   Acid blockers for heartburn.   Anti-inflammatory medicine.   Pain medicine for inflammatory conditions.   Antibiotics if an infection is present.   You may be advised to change lifestyle habits. This includes stopping smoking and avoiding alcohol, caffeine, and chocolate.   You may be advised to keep your head raised (elevated) when sleeping. This reduces the chance of acid going backward from your stomach into your esophagus.   Most of the time, nonspecific chest pain will improve within 2 to 3 days with rest and mild pain medicine.  HOME CARE INSTRUCTIONS   If antibiotics were prescribed, take your antibiotics as directed. Finish them even if you start to feel better.   For the next few  days, avoid physical activities that bring on chest pain. Continue physical activities as directed.   Do not smoke.   Avoid drinking alcohol.   Only take over-the-counter or prescription medicine for pain, discomfort, or fever as directed by your caregiver.   Follow your caregiver's suggestions for further testing if your chest pain does not go away.   Keep any follow-up appointments you made. If you do not go to an appointment, you could develop lasting (chronic) problems with pain. If there is any problem keeping an appointment, you must call to reschedule.  SEEK MEDICAL CARE IF:   You think you are having problems from the medicine you are taking. Read your medicine instructions carefully.   Your chest pain does not go away, even after treatment.   You develop a rash with blisters on your chest.  SEEK IMMEDIATE MEDICAL CARE IF:   You have increased chest pain or pain that spreads to your arm, neck, jaw, back, or abdomen.   You develop shortness of breath, an increasing cough, or you are coughing up blood.   You have severe back or abdominal pain, feel nauseous, or vomit.   You develop severe weakness, fainting, or chills.   You have a fever.  THIS IS AN EMERGENCY. Do not wait to see if the pain will go away. Get medical help at once. Call your local emergency services (911 in [place] Do not drive yourself to the hospital. MAKE SURE YOU:   Understand these instructions.     Will watch your condition.   Will get help right away if you are not doing well or get worse.  Document Released: 12/24/2004 Document Revised: 03/05/2011 Document Reviewed: 10/20/2007 [employer] Patient Information 2012 [employer]. 

## 2011-07-07 NOTE — ED Notes (Signed)
Pt sent from urgent care for further eval of left sided chest pain and right arm pain onset yesterday. Pt reports pain is intermittent and accompanied by nausea.

## 2011-07-07 NOTE — ED Provider Notes (Signed)
History     CSN: [id number]  Arrival date & time 07/07/11  1007   First MD Initiated Contact with Patient 07/07/11 1038      Chief Complaint  Patient presents with  . Chest Pain    (Consider location/radiation/quality/duration/timing/severity/associated sxs/prior treatment) HPI Comments: [name] presents for evaluation of left-sided chest pain that started yesterday as a dull ache but is now stabbing. She also now reports radiation to her right shoulder and down her right arm. She does report a history of hypertension, and some sort of arrhythmia, for which she takes diltiazem. She also takes lisinopril. She is followed by cardiologist as well as her primary care provider but has not seen a cardiologist in September. 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 She's also had a stress test. She denies any catheterization. She reports a strong family history of heart disease in both her mother and sister; stating that her sisters had 2 heart surgeries already by the age of 55. She reports headaches and nausea over the last week. She denies any visual disturbance, vomiting, weakness. The pain is nonexertional, but does resolve with rest. ECG at [facility] shows normal sinus rhythm, with a rate of 68 beats per minute, no ST elevations or changes, no interval changes, T wave inversion in V3; these are mostly unchanged when compared to previous EKG. Patient is a 36 y.o. female presenting with chest pain. The history is provided by the patient.  Chest Pain The chest pain began yesterday. Chest pain occurs constantly. The chest pain is unchanged. The quality of the pain is described as aching and sharp. The pain radiates to the right shoulder, right arm and right neck. Primary symptoms include nausea. Pertinent negatives for primary symptoms include no vomiting and no dizziness.  Associated symptoms include numbness.  Pertinent negatives for associated symptoms include no diaphoresis and no  weakness.     Past Medical History  Diagnosis Date  . Hypertension   . Cardiac arrhythmia     Past Surgical History  Procedure Date  . Appendectomy   . Tubal ligation     No family history on file.  History  Substance Use Topics  . Smoking status: Never Smoker   . Smokeless tobacco: Not on file  . Alcohol Use: No    OB History    Grav Para Term Preterm Abortions TAB SAB Ect Mult Living                  Review of Systems  Constitutional: Negative.  Negative for diaphoresis.  HENT: Negative.   Eyes: Negative.   Respiratory: Positive for chest tightness.   Cardiovascular: Positive for chest pain.  Gastrointestinal: Positive for nausea. Negative for vomiting.  Genitourinary: Negative.   Musculoskeletal: Negative.   Skin: Negative.   Neurological: Positive for numbness. Negative for dizziness and weakness.    Allergies  Review of patient's allergies indicates no known allergies.  Home Medications   Current Outpatient Rx  Name Route Sig Dispense Refill  . DILTIAZEM HCL 120 MG PO TABS Oral Take 120 mg by mouth daily.      Marland Kitchen LISINOPRIL PO Oral Take by mouth daily.      . GUAIFENESIN ER 600 MG PO TB12 Oral Take 2 tablets (1,200 mg total) by mouth 2 (two) times daily. 60 tablet 1    BP 132/72  Pulse 72  Temp(Src) 97.8 F (36.6 C) (Oral)  Resp 14  SpO2 99%  LMP 06/15/2011  Physical Exam  Nursing note and vitals reviewed. Constitutional: She is oriented to person, place, and time. She appears well-developed and well-nourished.  HENT:  Head: Normocephalic and atraumatic.  Right Ear: Tympanic membrane normal.  Left Ear: Tympanic membrane normal.  Mouth/Throat: Uvula is midline, oropharynx is clear and moist and mucous membranes are normal.  Eyes: EOM are normal.  Neck: Normal range of motion.  Cardiovascular: Normal rate, regular rhythm and S2 normal.   Murmur heard.  Systolic murmur is present with a grade of 2/6  Pulmonary/Chest: Effort normal and  breath sounds normal. She has no decreased breath sounds. She has no wheezes. She has no rhonchi.  Musculoskeletal: Normal range of motion.  Neurological: She is alert and oriented to person, place, and time.  Skin: Skin is warm and dry.  Psychiatric: Her behavior is normal.    ED Course  Procedures (including critical care time)  Labs Reviewed - No data to display No results found.   1. Chest pain       MDM  Transferred to Emergency Department for further evaluation        Renaee Munda, MD 07/07/11 1224

## 2011-07-07 NOTE — ED Notes (Signed)
Pt presents to department for evaluation of L sided chest pain radiating to jaw and R arm. Onset this morning. Pt states she has intermittent chest pain every week, but this pain felt more severe this morning. Describes as sharp stabbing sensation. Also states numbness to R arm and pain to jaw area. Denies SOB. Respirations unlabored. Lung sounds clear and equal bilaterally. Denies pain at the time. Skin warm and dry. No signs of distress noted at present.

## 2011-07-16 NOTE — ED Provider Notes (Signed)
History    35yF with CP. Very shapr and stabbing. L chest. Does not radiate. Onset 1d ago. No appreciable exacerbating or relieving factors. No fever or chills. No sob. Denies trauma.no palpitations. No diagnosed hx of cad but has risk factors. Reports previous stress testing which she thinks was normal.   CSN: [id number]  Arrival date & time 07/07/11  1220   First MD Initiated Contact with Patient 07/07/11 1406      Chief Complaint  Patient presents with  . Chest Pain  . Arm Pain    right arm    (Consider location/radiation/quality/duration/timing/severity/associated sxs/prior treatment) HPI  Past Medical History  Diagnosis Date  . Hypertension   . Cardiac arrhythmia     Past Surgical History  Procedure Date  . Appendectomy   . Tubal ligation     No family history on file.  History  Substance Use Topics  . Smoking status: Never Smoker   . Smokeless tobacco: Not on file  . Alcohol Use: No    OB History    Grav Para Term Preterm Abortions TAB SAB Ect Mult Living                  Review of Systems   Review of symptoms negative unless otherwise noted in HPI.   Allergies  Review of patient's allergies indicates no known allergies.  Home Medications   Current Outpatient Rx  Name Route Sig Dispense Refill  . DILTIAZEM HCL 120 MG PO TABS Oral Take 120 mg by mouth daily.      . IBUPROFEN 200 MG PO TABS Oral Take 200 mg by mouth every 6 (six) hours as needed. For pain    . LISINOPRIL 10 MG PO TABS Oral Take 10 mg by mouth daily.      BP 119/74  Pulse 72  Temp(Src) 97.7 F (36.5 C) (Oral)  Resp 19  SpO2 100%  LMP 06/15/2011  Physical Exam  Nursing note and vitals reviewed. Constitutional: She appears well-developed and well-nourished. No distress.       Sitting in bed. nad.  HENT:  Head: Normocephalic and atraumatic.  Eyes: Conjunctivae are normal. Right eye exhibits no discharge. Left eye exhibits no discharge.  Neck: Neck supple.  Cardiovascular:  Normal rate, regular rhythm and normal heart sounds.  Exam reveals no gallop and no friction rub.   No murmur heard. Pulmonary/Chest: Effort normal and breath sounds normal. No respiratory distress. She exhibits no tenderness.  Abdominal: Soft. She exhibits no distension. There is no tenderness.  Musculoskeletal: She exhibits no edema and no tenderness.       Lower extremities symmetric as compared to each other. No calf tenderness. Negative Homan's. No palpable cords.   Neurological: She is alert.  Skin: Skin is warm and dry. She is not diaphoretic.  Psychiatric: She has a normal mood and affect. Her behavior is normal. Thought content normal.    ED Course  Procedures (including critical care time)  Labs Reviewed  CBC - Abnormal; Notable for the following:    WBC 2.7 (*)    Hemoglobin 11.7 (*)    Platelets 148 (*)    All other components within normal limits  BASIC METABOLIC PANEL - Abnormal; Notable for the following:    Potassium 3.4 (*)    All other components within normal limits  POCT I-STAT TROPONIN I  LAB REPORT - SCANNED   No results found.  EKG:  Rhythm: nsr Rate: 69  Axis normal Intervals: normal ST segments:  Nonspecific ST changes. There is mild T-wave changes in lead 3. Comparison: No significant change from prior.   1. Chest pain       MDM  35yf with CP. Atypical given very brief stabbing nature. ekg nonprovocative and unchanged from prior. cxr without focal infiltrate or other concerning findings. Has cards fu. Return precautions discussed.       Raeford Razor, MD 07/16/11 [phone]

## 2012-03-14 ENCOUNTER — Emergency Department (INDEPENDENT_AMBULATORY_CARE_PROVIDER_SITE_OTHER)
Admission: EM | Admit: 2012-03-14 | Discharge: 2012-03-14 | Disposition: A | Discharge status: Home / Self Care | Payer: BC Managed Care – PPO | Source: Home / Self Care | Attending: Family Medicine | Admitting: Family Medicine

## 2012-03-14 ENCOUNTER — Encounter (HOSPITAL_COMMUNITY): Payer: Self-pay | Admitting: Emergency Medicine

## 2012-03-14 DIAGNOSIS — I10 Essential (primary) hypertension: Secondary | ICD-10-CM

## 2012-03-14 DIAGNOSIS — J069 Acute upper respiratory infection, unspecified: Secondary | ICD-10-CM

## 2012-03-14 DIAGNOSIS — J329 Chronic sinusitis, unspecified: Secondary | ICD-10-CM

## 2012-03-14 MED ORDER — PREDNISONE 20 MG PO TABS: ORAL_TABLET | ORAL | Status: DC

## 2012-03-14 MED ORDER — GUAIFENESIN-CODEINE 100-10 MG/5ML PO SYRP: 5.0000 mL | ORAL_SOLUTION | Freq: Three times a day (TID) | ORAL | Status: DC | PRN

## 2012-03-14 MED ORDER — LISINOPRIL 10 MG PO TABS: 10.0000 mg | ORAL_TABLET | Freq: Every day | ORAL | Status: DC

## 2012-03-14 MED ORDER — AMOXICILLIN 500 MG PO CAPS: 500.0000 mg | ORAL_CAPSULE | Freq: Three times a day (TID) | ORAL | Status: DC

## 2012-03-14 MED ORDER — DILTIAZEM HCL 120 MG PO TABS: 120.0000 mg | ORAL_TABLET | Freq: Every day | ORAL | Status: DC

## 2012-03-14 MED ORDER — CETIRIZINE HCL 10 MG PO TABS: 10.0000 mg | ORAL_TABLET | Freq: Every day | ORAL | Status: DC

## 2012-03-14 NOTE — ED Notes (Signed)
C/o ear ache and fever which started four days ago.  OTC medication taken but no relief.  C/o upset stomach and congestion also.  Denies chest pain, sob, and sore throat.

## 2012-03-14 NOTE — ED Provider Notes (Signed)
History     CSN: [id number]  Arrival date & time 03/14/12  1056   First MD Initiated Contact with Patient 03/14/12 1116      Chief Complaint  Patient presents with  . Otalgia  . Fever    (Consider location/radiation/quality/duration/timing/severity/associated sxs/prior treatment) HPI Comments: 36 year old female with history of hypertension. Here complaining of nasal congestion, headache rhinorrhea fever and chills for 5 days. Reports taking ibuprofen over-the-counter with no significant relief. Also complaining of nausea but no vomiting. No diarrhea. Reports dry cough but denies chest pain, shortness of breath or wheezing. Also patient reports she's not currently taking her blood pressure medications as she ran out and has not seen her primary care provider. Her blood pressure is 182/98 here today denies dizziness visual changes from baseline extremity weakness or numbness or balance problems.   Past Medical History  Diagnosis Date  . Hypertension   . Cardiac arrhythmia     Past Surgical History  Procedure Date  . Appendectomy   . Tubal ligation     History reviewed. No pertinent family history.  History  Substance Use Topics  . Smoking status: Never Smoker   . Smokeless tobacco: Not on file  . Alcohol Use: No    OB History    Grav Para Term Preterm Abortions TAB SAB Ect Mult Living                  Review of Systems  Constitutional: Positive for fever. Negative for chills and fatigue.  HENT: Positive for ear pain, congestion, rhinorrhea and sinus pressure. Negative for sore throat.   Eyes: Negative for pain.  Respiratory: Positive for cough. Negative for shortness of breath, wheezing and stridor.   Cardiovascular: Negative for chest pain, palpitations and leg swelling.  Gastrointestinal: Negative for nausea, vomiting and abdominal pain.  Neurological: Negative for dizziness and headaches.    Allergies  Review of patient's allergies indicates no known  allergies.  Home Medications   Current Outpatient Rx  Name  Route  Sig  Dispense  Refill  . AMOXICILLIN 500 MG PO CAPS   Oral   Take 1 capsule (500 mg total) by mouth 3 (three) times daily.   21 capsule   0   . CETIRIZINE HCL 10 MG PO TABS   Oral   Take 1 tablet (10 mg total) by mouth daily.   30 tablet   0   . DILTIAZEM HCL 120 MG PO TABS   Oral   Take 1 tablet (120 mg total) by mouth daily.   30 tablet   1   . GUAIFENESIN-CODEINE 100-10 MG/5ML PO SYRP   Oral   Take 5 mLs by mouth 3 (three) times daily as needed for cough.   120 mL   0   . LISINOPRIL 10 MG PO TABS   Oral   Take 1 tablet (10 mg total) by mouth daily.   10 tablet   0   . PREDNISONE 20 MG PO TABS      2 tabs po daily for 5 days   10 tablet   0     BP 182/98  Pulse 90  Temp 98.5 F (36.9 C) (Oral)  Resp 20  SpO2 98%  Physical Exam  Nursing note and vitals reviewed. Constitutional: She is oriented to person, place, and time. She appears well-developed and well-nourished. No distress.  HENT:       Nasal Congestion with erythema and swelling of nasal turbinates, white rhinorrhea. Nasal voice  and bilateral maxillary sinus tenderness with palpation. pharyngeal erythema no exudates. No uvula deviation. No trismus. TM's with increased vascular markings and some dullness bilaterally no swelling or bulging   Eyes: Conjunctivae normal are normal. Right eye exhibits no discharge. Left eye exhibits no discharge.  Neck: Neck supple. No JVD present.  Cardiovascular: Normal rate, regular rhythm and normal heart sounds.   Pulmonary/Chest: Effort normal and breath sounds normal. No respiratory distress. She has no wheezes. She has no rales. She exhibits no tenderness.  Lymphadenopathy:    She has no cervical adenopathy.  Neurological: She is alert and oriented to person, place, and time.  Skin: No rash noted. She is not diaphoretic.    ED Course  Procedures (including critical care time)  Labs  Reviewed - No data to display No results found.   1. URI (upper respiratory infection)   2. Sinusitis   3. Hypertension       MDM  #1 upper respiratory infection impress with developing sinusitis. Patient reports 5 days with symptoms including fever and chills and not improving. Possible viral process. Patient is status post influenza vaccine this year. Prescribed prednisone, cetirizine, Robitussin and provided a hold prescription for amoxicillin to start if persistent fever and chills after 7 days from day one. #2 high blood pressure refill patient chronic blood pressure medications. Patient was asked to call her primary care provider to have a followup appointment. Supportive care and red flags that should prompt her return to medical attention discussed with patient and provided in writing.        Sharin Grave, MD 03/16/12 [phone]

## 2012-07-09 ENCOUNTER — Encounter (HOSPITAL_COMMUNITY): Payer: Self-pay | Admitting: *Deleted

## 2012-07-09 ENCOUNTER — Emergency Department (HOSPITAL_COMMUNITY)
Admission: EM | Admit: 2012-07-09 | Discharge: 2012-07-10 | Disposition: A | Discharge status: Home / Self Care | Payer: Self-pay | Attending: Emergency Medicine | Admitting: Emergency Medicine

## 2012-07-09 DIAGNOSIS — I471 Supraventricular tachycardia: Secondary | ICD-10-CM

## 2012-07-09 DIAGNOSIS — I498 Other specified cardiac arrhythmias: Secondary | ICD-10-CM | POA: Insufficient documentation

## 2012-07-09 DIAGNOSIS — I10 Essential (primary) hypertension: Secondary | ICD-10-CM | POA: Insufficient documentation

## 2012-07-09 DIAGNOSIS — Z79899 Other long term (current) drug therapy: Secondary | ICD-10-CM | POA: Insufficient documentation

## 2012-07-09 DIAGNOSIS — R110 Nausea: Secondary | ICD-10-CM | POA: Insufficient documentation

## 2012-07-09 DIAGNOSIS — E876 Hypokalemia: Secondary | ICD-10-CM | POA: Insufficient documentation

## 2012-07-09 DIAGNOSIS — IMO0002 Reserved for concepts with insufficient information to code with codable children: Secondary | ICD-10-CM | POA: Insufficient documentation

## 2012-07-09 DIAGNOSIS — R002 Palpitations: Secondary | ICD-10-CM | POA: Insufficient documentation

## 2012-07-09 LAB — BASIC METABOLIC PANEL
BUN: 9 mg/dL (ref 6–23)
CO2: 23 mEq/L (ref 19–32)
Calcium: 9.1 mg/dL (ref 8.4–10.5)
Chloride: 103 mEq/L (ref 96–112)
Creatinine, Ser: 0.89 mg/dL (ref 0.50–1.10)
GFR calc Af Amer: >90 mL/min (ref >90)
GFR calc non Af Amer: 82 mL/min — ABNORMAL LOW (ref >90)
Glucose, Bld: 115 mg/dL — ABNORMAL HIGH (ref 70–99)
Potassium: 2.9 mEq/L — ABNORMAL LOW (ref 3.5–5.1)
Sodium: 137 mEq/L (ref 135–145)

## 2012-07-09 LAB — MAGNESIUM: Magnesium: 2.1 mg/dL (ref 1.5–2.5)

## 2012-07-09 MED ORDER — ADENOSINE 6 MG/2ML IV SOLN
6.0000 mg | Freq: Once | INTRAVENOUS | Status: AC
  Administered 2012-07-09: 6 mg via INTRAVENOUS

## 2012-07-09 MED ORDER — ADENOSINE 6 MG/2ML IV SOLN: 12.0000 mg | Freq: Once | INTRAVENOUS | Status: DC

## 2012-07-09 MED ORDER — ADENOSINE 6 MG/2ML IV SOLN
INTRAVENOUS | Status: AC
  Administered 2012-07-09: 12 mg
  Filled 2012-07-09: qty 10

## 2012-07-09 MED ORDER — POTASSIUM CHLORIDE 10 MEQ/100ML IV SOLN
10.0000 meq | Freq: Once | INTRAVENOUS | Status: AC
  Administered 2012-07-09: 10 meq via INTRAVENOUS
  Filled 2012-07-09: qty 100

## 2012-07-09 MED ORDER — POTASSIUM CHLORIDE CRYS ER 20 MEQ PO TBCR: 20.0000 meq | EXTENDED_RELEASE_TABLET | Freq: Every day | ORAL | Status: DC

## 2012-07-09 MED ORDER — POTASSIUM CHLORIDE 10 MEQ/100ML IV SOLN
INTRAVENOUS | Status: AC
  Filled 2012-07-09: qty 100

## 2012-07-09 MED ORDER — POTASSIUM CHLORIDE CRYS ER 20 MEQ PO TBCR
40.0000 meq | EXTENDED_RELEASE_TABLET | Freq: Once | ORAL | Status: AC
  Administered 2012-07-09: 40 meq via ORAL
  Filled 2012-07-09: qty 2

## 2012-07-09 NOTE — ED Notes (Addendum)
Pt c/o Rapid HR starting around 1730, that resolved but was replaced by Chest tightness with SOB, light headedness, nausea, weakness. Pain relived by nothing.

## 2012-07-09 NOTE — ED Provider Notes (Signed)
History     CSN: [id number]  Arrival date & time 07/09/12  1949   First MD Initiated Contact with Patient 07/09/12 2024      Chief Complaint  Patient presents with  . Chest Pain    (Consider location/radiation/quality/duration/timing/severity/associated sxs/prior treatment) HPI Comments: 6 y F with a PMH of HTN and SVT arrives with c/o "heart racing" that started this evening.  She was getting ready for dinner at the time.  Denies stimulant, caffeine, supplement, cocaine use.  She was in her normal state of health prior to this.  Patient is a 37 y.o. female presenting with palpitations.  Palpitations  This is a new ("has happened before") problem. The current episode started 1 to 2 hours ago. The problem occurs constantly. The problem has not changed since onset.The problem is associated with an unknown factor. Associated symptoms include irregular heartbeat and nausea. Pertinent negatives include no diaphoresis, no fever, no chest pain, no chest pressure, no exertional chest pressure, no vomiting, no lower extremity edema, no dizziness, no cough and no shortness of breath. She has tried nothing for the symptoms.    Past Medical History  Diagnosis Date  . Hypertension   . Cardiac arrhythmia     Past Surgical History  Procedure Laterality Date  . Appendectomy    . Tubal ligation      No family history on file.  History  Substance Use Topics  . Smoking status: Never Smoker   . Smokeless tobacco: Not on file  . Alcohol Use: No    OB History   Grav Para Term Preterm Abortions TAB SAB Ect Mult Living                  Review of Systems  Constitutional: Negative for fever and diaphoresis.  Respiratory: Negative for cough and shortness of breath.   Cardiovascular: Positive for palpitations. Negative for chest pain.  Gastrointestinal: Positive for nausea. Negative for vomiting.  Neurological: Negative for dizziness.  All other systems reviewed and are  negative.    Allergies  Review of patient's allergies indicates no known allergies.  Home Medications   Current Outpatient Rx  Name  Route  Sig  Dispense  Refill  . amoxicillin (AMOXIL) 500 MG capsule   Oral   Take 1 capsule (500 mg total) by mouth 3 (three) times daily.   21 capsule   0   . cetirizine (ZYRTEC) 10 MG tablet   Oral   Take 1 tablet (10 mg total) by mouth daily.   30 tablet   0   . diltiazem (CARDIZEM) 120 MG tablet   Oral   Take 1 tablet (120 mg total) by mouth daily.   30 tablet   1   . guaiFENesin-codeine (ROBITUSSIN AC) 100-10 MG/5ML syrup   Oral   Take 5 mLs by mouth 3 (three) times daily as needed for cough.   120 mL   0   . lisinopril (PRINIVIL,ZESTRIL) 10 MG tablet   Oral   Take 1 tablet (10 mg total) by mouth daily.   10 tablet   0   . predniSONE (DELTASONE) 20 MG tablet      2 tabs po daily for 5 days   10 tablet   0     BP 128/74  Pulse 151  Resp 14  SpO2 100%  Physical Exam  Vitals reviewed. Constitutional: She is oriented to person, place, and time. She appears well-developed and well-nourished.  HENT:  Right Ear: External ear  normal.  Left Ear: External ear normal.  Mouth/Throat: No oropharyngeal exudate.  Eyes: Conjunctivae and EOM are normal. Pupils are equal, round, and reactive to light.  Neck: Normal range of motion. Neck supple.  Cardiovascular: Regular rhythm, normal heart sounds and intact distal pulses.  Exam reveals no gallop and no friction rub.   No murmur heard. tachycardic  Pulmonary/Chest: Effort normal and breath sounds normal.  Abdominal: Soft. Bowel sounds are normal. She exhibits no distension. There is no tenderness.  Musculoskeletal: Normal range of motion. She exhibits no edema.  Neurological: She is alert and oriented to person, place, and time. No cranial nerve deficit.  Skin: Skin is warm and dry. No rash noted.  Psychiatric: She has a normal mood and affect.    ED Course  Procedures  (including critical care time)  Labs Reviewed  BASIC METABOLIC PANEL - Abnormal; Notable for the following:    Potassium 2.9 (*)    Glucose, Bld 115 (*)    GFR calc non Af Amer 82 (*)    All other components within normal limits  MAGNESIUM   No results found.   1. SVT (supraventricular tachycardia)   2. Hypokalemia       MDM   57 y F with a PMH of HTN and SVT arrives with c/o "heart racing" that started this evening.  She was getting ready for dinner at the time.  Denies stimulant, caffeine, supplement, cocaine use.  She was in her normal state of health prior to this.  HR in 140-150s.  BPs adequate.  Mentating appropriately.  Lungs clear.  EKG on arrival c/w regular narrow SVT.  Vagal x4 and carotid massage x3 did not break the rhythm.  She was then placed on the Zoll monitor and adenosine 6 mg administered, but SVT resumed.  12 mg then tried with improvement of HR into 100s.  Will replete K and monitor.  NS bolus.  11:15 PM Pt's HR has remained in acceptable ranges.  At discharge she began reporting weeks of multiple complaints including mild bilateral foot swelling, weight loss and decreased energy.  She was reexamined and found to have no appreciable edema, clear lungs, no goiter.  The option of beginning a workup was discussed and the patient elected to f/u with her PCP closely for further evaluation.  We agree with this decision.  Return precautions reviewed.  Pt counseled to avoid stimulating substances such as tea, coffee and soda.  It is felt the pt is stable for d/c with close PCP f/u.  All questions answered and patient expressed understanding.  Disposition: Discharge  Condition: Good  Pt seen in conjunction with my attending, Dr. Rhunette Croft.  Follow-up Information   Schedule an appointment as soon as possible for a visit with Karie Chimera, MD.   Contact information:   [address] [place] [address] [phone]       Follow up with  Pamella Pert, MD.   Contact information:   [address] [place] [address] [phone]        Medication List    TAKE these medications       potassium chloride SA 20 MEQ tablet  Commonly known as:  K-DUR,KLOR-CON  Take 1 tablet (20 mEq total) by mouth daily.     Oleh Genin, MD PGY-II [facility] Emergency Medicine Resident  Oleh Genin, MD 07/09/12 [phone]

## 2012-07-10 NOTE — ED Provider Notes (Signed)
I performed a history and physical examination of  [name] and discussed her management with Dr. Beverely Pace. I agree with the history, physical, assessment, and plan of care, with the following exceptions: None I was present for the following procedures: None  Time Spent in Critical Care of the patient: None  Time spent in discussions with the patient and family: 5-10 minutes  [name]  Derwood Kaplan, MD 07/10/12 1515

## 2012-07-20 ENCOUNTER — Encounter (HOSPITAL_COMMUNITY): Payer: Self-pay | Admitting: Emergency Medicine

## 2012-07-20 ENCOUNTER — Inpatient Hospital Stay (HOSPITAL_COMMUNITY)
Admission: EM | Admit: 2012-07-20 | Discharge: 2012-07-22 | DRG: 251 | Disposition: A | Discharge status: Home / Self Care | Payer: Medicaid Other | Attending: Internal Medicine | Admitting: Internal Medicine

## 2012-07-20 DIAGNOSIS — I471 Supraventricular tachycardia: Secondary | ICD-10-CM | POA: Diagnosis present

## 2012-07-20 DIAGNOSIS — I498 Other specified cardiac arrhythmias: Principal | ICD-10-CM | POA: Diagnosis present

## 2012-07-20 DIAGNOSIS — R51 Headache: Secondary | ICD-10-CM | POA: Diagnosis present

## 2012-07-20 DIAGNOSIS — F411 Generalized anxiety disorder: Secondary | ICD-10-CM | POA: Diagnosis present

## 2012-07-20 DIAGNOSIS — E876 Hypokalemia: Secondary | ICD-10-CM | POA: Diagnosis present

## 2012-07-20 DIAGNOSIS — R634 Abnormal weight loss: Secondary | ICD-10-CM | POA: Diagnosis present

## 2012-07-20 DIAGNOSIS — Z6833 Body mass index (BMI) 33.0-33.9, adult: Secondary | ICD-10-CM

## 2012-07-20 DIAGNOSIS — I10 Essential (primary) hypertension: Secondary | ICD-10-CM | POA: Diagnosis present

## 2012-07-20 DIAGNOSIS — I517 Cardiomegaly: Secondary | ICD-10-CM | POA: Diagnosis present

## 2012-07-20 DIAGNOSIS — Z91199 Patient's noncompliance with other medical treatment and regimen due to unspecified reason: Secondary | ICD-10-CM

## 2012-07-20 DIAGNOSIS — Z9119 Patient's noncompliance with other medical treatment and regimen: Secondary | ICD-10-CM

## 2012-07-20 DIAGNOSIS — D649 Anemia, unspecified: Secondary | ICD-10-CM

## 2012-07-20 DIAGNOSIS — D509 Iron deficiency anemia, unspecified: Secondary | ICD-10-CM | POA: Diagnosis present

## 2012-07-20 DIAGNOSIS — N920 Excessive and frequent menstruation with regular cycle: Secondary | ICD-10-CM | POA: Diagnosis present

## 2012-07-20 DIAGNOSIS — G4730 Sleep apnea, unspecified: Secondary | ICD-10-CM | POA: Diagnosis present

## 2012-07-20 HISTORY — DX: Chest pain, unspecified: R07.9

## 2012-07-20 HISTORY — DX: Headache, unspecified: R51.9

## 2012-07-20 HISTORY — DX: Cardiac murmur, unspecified: R01.1

## 2012-07-20 HISTORY — DX: Supraventricular tachycardia, unspecified: I47.10

## 2012-07-20 HISTORY — DX: Supraventricular tachycardia: I47.1

## 2012-07-20 HISTORY — DX: Anemia, unspecified: D64.9

## 2012-07-20 HISTORY — DX: Shortness of breath: R06.02

## 2012-07-20 HISTORY — DX: Sleep apnea, unspecified: G47.30

## 2012-07-20 HISTORY — DX: Syncope and collapse: R55

## 2012-07-20 HISTORY — DX: Anxiety disorder, unspecified: F41.9

## 2012-07-20 HISTORY — DX: Headache: R51

## 2012-07-20 LAB — OCCULT BLOOD, POC DEVICE: Fecal Occult Bld: NEGATIVE

## 2012-07-20 LAB — HCG, SERUM, QUALITATIVE: Preg, Serum: NEGATIVE

## 2012-07-20 LAB — ABO/RH: ABO/RH(D): A POS

## 2012-07-20 LAB — BASIC METABOLIC PANEL
BUN: 9 mg/dL (ref 6–23)
Creatinine, Ser: 0.79 mg/dL (ref 0.50–1.10)
GFR calc Af Amer: >90 mL/min (ref >90)
GFR calc non Af Amer: >90 mL/min (ref >90)

## 2012-07-20 LAB — CBC
HCT: 25.5 % — ABNORMAL LOW (ref 36.0–46.0)
MCHC: 31.4 g/dL (ref 30.0–36.0)
MCV: 84.7 fL (ref 78.0–100.0)
Platelets: 167 10*3/uL (ref 150–400)
RDW: 16.7 % — ABNORMAL HIGH (ref 11.5–15.5)

## 2012-07-20 LAB — RETICULOCYTES
RBC.: 2.79 MIL/uL — ABNORMAL LOW (ref 3.87–5.11)
Retic Count, Absolute: 39.1 10*3/uL (ref 19.0–186.0)
Retic Ct Pct: 1.4 % (ref 0.4–3.1)

## 2012-07-20 LAB — TROPONIN I: Troponin I: <0.3 ng/mL (ref <0.30)

## 2012-07-20 LAB — VITAMIN B12: Vitamin B-12: 502 pg/mL (ref 211–911)

## 2012-07-20 MED ORDER — ADULT MULTIVITAMIN W/MINERALS CH
1.0000 | ORAL_TABLET | Freq: Every day | ORAL | Status: DC
  Administered 2012-07-21 – 2012-07-22 (×2): 1 via ORAL
  Filled 2012-07-20 (×3): qty 1

## 2012-07-20 MED ORDER — DILTIAZEM HCL ER COATED BEADS 120 MG PO CP24
120.0000 mg | ORAL_CAPSULE | Freq: Every day | ORAL | Status: DC
  Administered 2012-07-20: 120 mg via ORAL
  Filled 2012-07-20: qty 1

## 2012-07-20 MED ORDER — SODIUM CHLORIDE 0.9 % IV SOLN
INTRAVENOUS | Status: DC
  Administered 2012-07-20 – 2012-07-22 (×4): via INTRAVENOUS

## 2012-07-20 MED ORDER — HYDRALAZINE HCL 20 MG/ML IJ SOLN: 5.0000 mg | Freq: Four times a day (QID) | INTRAMUSCULAR | Status: DC | PRN

## 2012-07-20 MED ORDER — ADENOSINE 6 MG/2ML IV SOLN
INTRAVENOUS | Status: AC
  Filled 2012-07-20: qty 8

## 2012-07-20 MED ORDER — ASPIRIN EC 81 MG PO TBEC
81.0000 mg | DELAYED_RELEASE_TABLET | Freq: Every day | ORAL | Status: DC
  Administered 2012-07-20: 81 mg via ORAL
  Filled 2012-07-20 (×2): qty 1

## 2012-07-20 MED ORDER — ACETAMINOPHEN 325 MG PO TABS: 650.0000 mg | ORAL_TABLET | Freq: Four times a day (QID) | ORAL | Status: DC | PRN

## 2012-07-20 MED ORDER — DILTIAZEM HCL 100 MG IV SOLR
5.0000 mg/h | INTRAVENOUS | Status: DC
  Administered 2012-07-20: 5 mg/h via INTRAVENOUS
  Filled 2012-07-20 (×2): qty 100

## 2012-07-20 MED ORDER — MAGNESIUM SULFATE IN D5W 10-5 MG/ML-% IV SOLN
1.0000 g | Freq: Once | INTRAVENOUS | Status: AC
  Administered 2012-07-21: 1 g via INTRAVENOUS
  Filled 2012-07-20: qty 100

## 2012-07-20 MED ORDER — ENOXAPARIN SODIUM 40 MG/0.4ML ~~LOC~~ SOLN
40.0000 mg | SUBCUTANEOUS | Status: DC
  Administered 2012-07-20 – 2012-07-21 (×2): 40 mg via SUBCUTANEOUS
  Filled 2012-07-20 (×3): qty 0.4

## 2012-07-20 MED ORDER — ADENOSINE 6 MG/2ML IV SOLN
12.0000 mg | Freq: Once | INTRAVENOUS | Status: AC
  Administered 2012-07-20: 12 mg via INTRAVENOUS

## 2012-07-20 MED ORDER — SODIUM CHLORIDE 0.9 % IJ SOLN
3.0000 mL | Freq: Two times a day (BID) | INTRAMUSCULAR | Status: DC
  Administered 2012-07-20 – 2012-07-21 (×2): 3 mL via INTRAVENOUS

## 2012-07-20 MED ORDER — POTASSIUM CHLORIDE CRYS ER 20 MEQ PO TBCR
40.0000 meq | EXTENDED_RELEASE_TABLET | Freq: Once | ORAL | Status: AC
  Administered 2012-07-20: 40 meq via ORAL
  Filled 2012-07-20: qty 2

## 2012-07-20 MED ORDER — ACETAMINOPHEN 650 MG RE SUPP: 650.0000 mg | Freq: Four times a day (QID) | RECTAL | Status: DC | PRN

## 2012-07-20 MED ORDER — SODIUM CHLORIDE 0.9 % IV SOLN: INTRAVENOUS | Status: DC

## 2012-07-20 MED ORDER — SODIUM CHLORIDE 0.9 % IV BOLUS (SEPSIS)
1000.0000 mL | Freq: Once | INTRAVENOUS | Status: AC
  Administered 2012-07-20: 1000 mL via INTRAVENOUS

## 2012-07-20 MED ORDER — ONDANSETRON HCL 4 MG PO TABS: 4.0000 mg | ORAL_TABLET | Freq: Four times a day (QID) | ORAL | Status: DC | PRN

## 2012-07-20 MED ORDER — LISINOPRIL 10 MG PO TABS
10.0000 mg | ORAL_TABLET | Freq: Every day | ORAL | Status: DC
  Administered 2012-07-20 – 2012-07-22 (×3): 10 mg via ORAL
  Filled 2012-07-20 (×3): qty 1

## 2012-07-20 MED ORDER — DILTIAZEM HCL 60 MG PO TABS: 120.0000 mg | ORAL_TABLET | Freq: Every day | ORAL | Status: DC

## 2012-07-20 MED ORDER — NITROGLYCERIN 0.4 MG SL SUBL: 0.4000 mg | SUBLINGUAL_TABLET | SUBLINGUAL | Status: DC | PRN

## 2012-07-20 MED ORDER — ONDANSETRON HCL 4 MG/2ML IJ SOLN
4.0000 mg | Freq: Four times a day (QID) | INTRAMUSCULAR | Status: DC | PRN
  Administered 2012-07-22: 4 mg via INTRAVENOUS
  Filled 2012-07-20: qty 2

## 2012-07-20 NOTE — ED Notes (Signed)
Consent for blood signed by patient.

## 2012-07-20 NOTE — ED Notes (Signed)
Pt states she has been out of her cardizem and lisinopril since mid march

## 2012-07-20 NOTE — Progress Notes (Signed)
~  10 PM patient c/o 6/10 chest pain HR 121 BP 148/78, O2 99%. EKG performed   [facility]/Niu MDs went to see the patient

## 2012-07-20 NOTE — Progress Notes (Signed)
Patient refused cpap tonight. RT will continue to monitor. 

## 2012-07-20 NOTE — ED Provider Notes (Signed)
History     CSN: [id number]  Arrival date & time 07/20/12  1428   First MD Initiated Contact with Patient 07/20/12 1457      Chief Complaint  Patient presents with  . Tachycardia    (Consider location/radiation/quality/duration/timing/severity/associated sxs/prior treatment) HPI Pt with palpitations x 1 hour. + mild SOB and chest tightness. Pt has history of SVT. Out of meds x 1 month. States she has had 3 episodes in the past 2 weeks. Followed by Dr Jacinto Halim. No dietary changes.  Past Medical History  Diagnosis Date  . Hypertension   . Cardiac arrhythmia   . SVT (supraventricular tachycardia)     "today, last week, 2 wk ago; maybe 1 month ago, etc; started w/in last 3-4 yrs"(07/20/2012)  . Fainting     "~ 1 month ago; probably related to SVT" (07/20/2012)  . Heart murmur     "small" (07/20/2012)  . Shortness of breath     "related to SVT episodes" (07/20/2012)  . Anemia     "today" (07/20/2012)  . Chest pain at rest     "related to SVT" (07/20/2012)  . Sleep apnea     "don't wear mask" (07/20/2012)  . Daily headache   . Anxiety     Past Surgical History  Procedure Laterality Date  . Appendectomy  2011  . Tubal ligation  2010  . Cesarean section  2001; 2010  . Cervical biopsy  2013    History reviewed. No pertinent family history.  History  Substance Use Topics  . Smoking status: Never Smoker   . Smokeless tobacco: Never Used  . Alcohol Use: No     Comment: 07/20/2012 "might have a beer once/yr"    OB History   Grav Para Term Preterm Abortions TAB SAB Ect Mult Living                  Review of Systems  Constitutional: Negative for fever and chills.  Respiratory: Positive for chest tightness and shortness of breath. Negative for cough and wheezing.   Cardiovascular: Positive for palpitations. Negative for chest pain and leg swelling.  Gastrointestinal: Negative for nausea, vomiting and abdominal pain.  Musculoskeletal: Negative for myalgias and back pain.   Skin: Negative for rash and wound.  Neurological: Negative for dizziness, weakness, numbness and headaches.  All other systems reviewed and are negative.    Allergies  Other  Home Medications   No current outpatient prescriptions on file.  BP 144/85  Pulse 115  Temp(Src) 99.4 F (37.4 C) (Oral)  Resp 24  Wt 193 lb 12.8 oz (87.907 kg)  SpO2 99%  LMP 07/10/2012  Physical Exam  Nursing note and vitals reviewed. Constitutional: She is oriented to person, place, and time. She appears well-developed and well-nourished. No distress.  HENT:  Head: Normocephalic and atraumatic.  Mouth/Throat: Oropharynx is clear and moist.  Eyes: EOM are normal. Pupils are equal, round, and reactive to light.  Neck: Normal range of motion. Neck supple.  Cardiovascular: Regular rhythm.   tachycardia  Pulmonary/Chest: Effort normal and breath sounds normal. No respiratory distress. She has no wheezes. She has no rales.  Abdominal: Soft. Bowel sounds are normal. She exhibits no distension and no mass. There is no tenderness. There is no rebound and no guarding.  Musculoskeletal: Normal range of motion. She exhibits no edema and no tenderness.  No calf swelling or tenderness  Neurological: She is alert and oriented to person, place, and time.  Moves all ext without deficit  Skin: Skin is warm and dry. No rash noted. No erythema.  Psychiatric: She has a normal mood and affect. Her behavior is normal.    ED Course  Procedures (including critical care time)  Labs Reviewed  CBC - Abnormal; Notable for the following:    WBC 3.6 (*)    RBC 3.01 (*)    Hemoglobin 8.0 (*)    HCT 25.5 (*)    RDW 16.7 (*)    All other components within normal limits  BASIC METABOLIC PANEL - Abnormal; Notable for the following:    Potassium 3.2 (*)    Glucose, Bld 116 (*)    All other components within normal limits  IRON AND TIBC - Abnormal; Notable for the following:    Iron 26 (*)    Saturation Ratios 10 (*)     All other components within normal limits  RETICULOCYTES - Abnormal; Notable for the following:    RBC. 2.79 (*)    All other components within normal limits  HCG, SERUM, QUALITATIVE  VITAMIN B12  FOLATE  FERRITIN  MAGNESIUM  TROPONIN I  HIV ANTIBODY (ROUTINE TESTING)  HEMOGLOBIN A1C  TSH  TROPONIN I  TROPONIN I  OCCULT BLOOD X 1 CARD TO LAB, STOOL  COMPREHENSIVE METABOLIC PANEL  CBC  URINE RAPID DRUG SCREEN (HOSP PERFORMED)  PREGNANCY, URINE  LIPID PANEL  OCCULT BLOOD, POC DEVICE  TYPE AND SCREEN  ABO/RH   No results found.   1. SVT (supraventricular tachycardia)   2. Anemia   3. Hypokalemia   4. Essential hypertension, benign      Date: 07/20/2012  Rate: 160  Rhythm: supraventricular tachycardia (SVT)  QRS Axis: normal  Intervals: normal  ST/T Wave abnormalities: nonspecific T wave changes  Conduction Disutrbances:none  Narrative Interpretation:   Old EKG Reviewed: changes noted      MDM  Pt failed to convert to sinus rhythm with vagal maneuvers and carotid massage. Given that the last time she was seen she required 12 mg of Adenosine, that dose was used initially. Pt converted to sinus Tachy with 12 mg adenosine. SOB and chest tightness improved   Date: 07/20/2012  Rate: 112  Rhythm: sinus tachycardia  QRS Axis: normal  Intervals: normal  ST/T Wave abnormalities: nonspecific T wave changes  Conduction Disutrbances:none  Narrative Interpretation:   Old EKG Reviewed: changes noted   Teaching service to admit.        Loren Racer, MD 07/20/12 [phone]

## 2012-07-20 NOTE — H&P (Signed)
Hospital Admission Note Date: 07/20/2012  Patient name: [name] Medical record number: [id number] Date of birth: July 28, 1975 Age: 37 y.o. Gender: female PCP: Karie Chimera, MD  Medical Service: Internal Medicine Teaching Service--[facility]  Attending physician: Dr. Rogelia Boga    1st Contact: Dr. Cicero Duck 2nd Contact: Dr. Dorise Hiss    Pager:[phone] After 5 pm or weekends: 1st Contact:      Pager: [phone] 2nd Contact:      Pager: [phone]  Chief Complaint: Heart racing  History of Present Illness:[name] is a 37 year old African American female with PMH of uncontrolled HTN, SVT, headaches, and anxiety presenting to the ED today with complaints of sudden onset chest palpitations, shortness of breath, sweating, chest tightness, and nausea during lunch this afternoon. He chest tightness is localized to left breast region, radiating occasionally to right arm in the past but currently non-radiating, and tender to palpation.  This is her third episode in the past 2 weeks.  Her last similar episode like this was last week when she came to the ED as well on 07/09/12.  At that time, she was found to be in SVT with HR in 140-150s and eventually HR improved to 100s with 12mg  adenosine.  Today, on admission her EKG revealed HR 160bpm SVT.  She failed to convert to sinus rhythm with vagal maneuvers and carotid massage and eventually converted to sinus tachycardia with 12mg  of adenosine.   [name] also endorses daily headaches, cold intolerance, always feeling tired, lightheadedness, syncopal episode x1 last month, and frequent vomiting.  She has decreased appetite and eats smaller portions.  When she does "over eat" she feels like her food gets stuck in her throat and then ends up vomiting without trying.  She has no trouble with liquids.  She denies any significant blood with her vomit but did notice 1 or 2 drops in the past and claims the food is at times un-digested and acidic.  She also has  intermittent nosebleeds approximately once a month.  Denies any blood in her stool.  Un-intentional weightloss over the past year, was a size 22 a year ago and down to size 12 currently.  Per Dr. Jacinto Halim, her cardiologist, she was last seen 2 years ago by him and at that time her weight was 228lbs and down to 193 lbs today.   Of note, we discussed with Dr. Jacinto Halim on the phone today and informed that her last Echo was 09/2010: EF normal and study significant for LVH and a negative exercise stress test as well at that time.  She has a significant family history of SVT including her mother and sisters.  Her family history is also significant for breast and ovarian cancer and anemia requiring iron transfusions in her mother who is now in remission and 1 of 2 sisters testing positive for the BRCA gene.  [name] herself has not been tested for BRCA, has never had a mammogram or a colonoscopy.    GYN hx: G2P2 both c-section deliveries, LMP last week, hx of irregular menses with heavy bleeding (has had cycles with changing up to 1 pad per hour), follows with Dr. Cherly Hensen from [facility], s/p tubal ligation 2010.    Meds: Prescriptions prior to admission  Medication Sig Dispense Refill  . diltiazem (CARDIZEM) 120 MG tablet Take 1 tablet (120 mg total) by mouth daily.  30 tablet  1  . ibuprofen (ADVIL,MOTRIN) 400 MG tablet Take 400 mg by mouth daily.      Marland Kitchen  lisinopril (PRINIVIL,ZESTRIL) 10 MG tablet Take 1 tablet (10 mg total) by mouth daily.  10 tablet  0  . Multiple Vitamin (MULTIVITAMIN WITH MINERALS) TABS Take 1 tablet by mouth daily.      . potassium chloride SA (K-DUR,KLOR-CON) 20 MEQ tablet Take 1 tablet (20 mEq total) by mouth daily.  30 tablet  0   Allergies: Allergies as of 07/20/2012 - Review Complete 07/20/2012  Allergen Reaction Noted  . Other  07/20/2012   Past Medical History  Diagnosis Date  . Hypertension   . Cardiac arrhythmia   . SVT (supraventricular tachycardia)     "today, last week, 2  wk ago; maybe 1 month ago, etc; started w/in last 3-4 yrs"(07/20/2012)  . Fainting     "~ 1 month ago; probably related to SVT" (07/20/2012)  . Heart murmur     "small" (07/20/2012)  . Shortness of breath     "related to SVT episodes" (07/20/2012)  . Anemia     "today" (07/20/2012)  . Chest pain at rest     "related to SVT" (07/20/2012)  . Sleep apnea     "don't wear mask" (07/20/2012)  . Daily headache   . Anxiety    Past Surgical History  Procedure Laterality Date  . Appendectomy  2011  . Tubal ligation  2010  . Cesarean section  2001; 2010  . Cervical biopsy  2013   History reviewed. No pertinent family history. History   Social History  . Marital Status: Single    Spouse Name: N/A    Number of Children: N/A  . Years of Education: N/A   Occupational History  . Not on file.   Social History Main Topics  . Smoking status: Never Smoker   . Smokeless tobacco: Never Used  . Alcohol Use: No     Comment: 07/20/2012 "might have a beer once/yr"  . Drug Use: No  . Sexually Active: Not Currently   Other Topics Concern  . Not on file   Social History Narrative  . No narrative on file   Review of Systems:  Constitutional:  Unintentional weight-loss, fatigue, appetite change, diaphoresis during palpitations, cold intolerance.  Denies fever, chills.   HEENT:  Food getting stuck in throat.  Denies congestion, sore throat, rhinorrhea, sneezing, mouth sores, neck pain   Respiratory:  SOB with palpitations.  Denies DOE, cough, and wheezing.   Cardiovascular:  Palpitations and intermittent leg swelling, chest tightness.   Gastrointestinal:  Nausea, vomiting, occasional abdominal pain.  Denies diarrhea, constipation, blood in stool, and abdominal distention.   Genitourinary:  Denies dysuria, urgency, frequency, hematuria, flank pain and difficulty urinating.   Musculoskeletal:  Denies myalgias, back pain, joint swelling, arthralgias and gait problem.   Skin:  Denies pallor, rash and  wound.   Neurological:  Syncope x1 in the past, light-headedness, lethargy, and headaches.  Denies dizziness, seizures, syncope, weakness, light-headedness, numbness.     Physical Exam: Blood pressure 175/87, pulse 96, temperature 98.1 F (36.7 C), temperature source Oral, resp. rate 25, last menstrual period 07/10/2012, SpO2 100.00%. Vitals reviewed. General: resting in bed, NAD HEENT: PERRL, EOMI, no scleral icterus Cardiac: Tachycardia, no rubs, or gallops, +2/6 systolic murmur loudest at upper sternal borders Chest: tender to palpation of chest in area of left breast.  Large breasts with multiple skin folds due to recent weight loss.  Pulm: clear to auscultation bilaterally, no wheezes, rales, or rhonchi Abd: soft, nontender, nondistended, BS present Ext: warm and well perfused, no pedal edema, +  2 dp b/l Neuro: alert and oriented X3, cranial nerves II-XII grossly intact, strength and sensation to light touch equal in bilateral upper and lower extremities  Lab results: Basic Metabolic Panel:  Recent Labs  47/82/95 1441  NA 136  K 3.2*  CL 100  CO2 24  GLUCOSE 116*  BUN 9  CREATININE 0.79  CALCIUM 9.1   CBC:  Recent Labs  07/20/12 1441  WBC 3.6*  HGB 8.0*  HCT 25.5*  MCV 84.7  PLT 167   Anemia Panel:  Recent Labs  07/20/12 1603  RETICCTPCT 1.4   Urine Drug Screen: Drugs of Abuse     Component Value Date/Time   LABOPIA NONE DETECTED 01/16/2010 0835   COCAINSCRNUR NONE DETECTED 01/16/2010 0835   LABBENZ NONE DETECTED 01/16/2010 0835   AMPHETMU NONE DETECTED 01/16/2010 0835   THCU NONE DETECTED 01/16/2010 0835   LABBARB  Value: NONE DETECTED        DRUG SCREEN FOR MEDICAL PURPOSES ONLY.  IF CONFIRMATION IS NEEDED FOR ANY PURPOSE, NOTIFY LAB WITHIN 5 DAYS.        LOWEST DETECTABLE LIMITS FOR URINE DRUG SCREEN Drug Class       Cutoff (ng/mL) Amphetamine      1000 Barbiturate      200 Benzodiazepine   200 Tricyclics       300 Opiates          300 Cocaine           300 THC              50 01/16/2010 0835    Other results: EKG @ 1438: HR 160bp, SVT, qtc 476, t wave inversion in leads v5 and v6.   EKG @ 1507: HR 112 sinus tachycardia, t wave flattening vs. Inversions in leads II, III, v5 and v6  Assessment & Plan by Problem: Ms. Mcelmurry is a 37 year old female with PMH of HTN and SVT admitted for recurrent SVT.      SVT (supraventricular tachycardia)--HR 160bpm in ED on initial EKG.  Sudden onset palpitations and diaphoresis with chest tightness this afternoon.  Multiple similar episodes in the past with most recent ED visit on 07/09/12.  Failed to convert on both occasions with vagal maneuvers and carotid massage but did convert back to sinus rhythm on both occasions with 12mg  of adenosine.  Unknown etiology but does claim to be out of her medications x1 month and Hb of 8 on admission.  Possible causes could be anemia, thyroid issues (reports symptoms of hypothyroidism), cardiac origin, pregnancy (althoug s/p tubal ligation), and stress with history of anxiety.  She denies any dietary changes, caffeine use, smoking cigarettes, alcohol abuse or illicit drug use.  Follows with Dr. Jacinto Halim and was last seen 2 years prior when echo and exercise stress test done at that time.  Spoke with Dr. Jacinto Halim on the telephone on day of admission, claimed echo at that time was normal and only significant for LVH with negative exercise stress test.  Significant family history of SVT with unknown etiology in mother and sisters.  Home medications include Cardizem 120mg  qd and Lisinopril 10mg  qd.  She does endorse chest tightness, non-radiating, localized to area of left breat but tender to palpation.  EKG showed T wave changes flattening vs. inversions in leads II, III, v5, and v6.  Therefore, will work up for ACS as well.  TIMI score of 0 since young age, hx of HTN, no known CAD, no ASA use.  CXR on 07/06/12: low volume chest with basilar atelectasis. Modified geneva score of 5 due to HR >95  with intermediate probability of PE is considered, however, denies prior DVT or PE, recent travel, no known active malignancy (does have un-intentional weightloss), or leg pain.    -admit to tele -repeat EKG in AM -cycle CE -Dr. Jacinto Halim will see her tomorrow, may consider ablation. -NPO -IVF -oxygen therapy prn, keep 02 sat >92% -UDS -ASA -Continue home dose Cardizem per Dr. Jacinto Halim -Continue Lisinopril home dose -nitroglycerin prn chest pain -risk stratification: HbA1C, TSH, Lipid panel -AM labs: CBC and CMET  -zofran prn nausea -obtain records from PCP Dr. Pecola Leisure and Dr. Jacinto Halim    Anemia--Hb 8 and Platelets 167 on admission with MCV 84.7.  Hb down from 11.7 on 07/07/2011 with baseline Hb 10-11.  Retic ct pct 1.4 on admission with RBC 2.79.  No signs of active bleeding but does report at least 1 nose bleed a month that lasts <5 minutes.  FOBT negative on admission.  Reports 1 or 2 drops of blood noticed once in vomit, but denies bloody BMS or hematuria. Mother has history of anemia requiring iron transfusions in the past.  Denies family or personal history of bleeding disorder.  Hx of heavy menses, last LMP last week.  Follows with Dr. Cherly Hensen @ [facility].  She reports having what she thinks was a cervical biopsy in the past but is not clear. Has never received a blood transfusion.    -monitor Hb, trend CBC -type and screened in ED    Hypertension--uncontrolled, non-adherent to home medication, has been out of her medications x1 month.  Home medications include: Cardizem 120mg  qd and Lisinopril 10mg  qd.  BP on admission: 175/87.   -continue home medications: Cardizem and Lisinopril -Hydralazine prn sbp>180 or dbp>100    Hypokalemia--K 3.2 on admission.  Likely secondary to vomiting.   -replaced -continue to monitor    Unintentional weight-loss--Ms. Niederer claims she used to be a size 22 and is now down to a 12 over the past year without trying.  Weight on admission was 193lb.  She has  decreased appetite, frequent vomiting, eats smaller portions and feels like when she eats bigger portions they get stuck in her throat.  She does not have trouble with liquids and denies odynophagia.  She has significant family history of breast and ovarian cancer in her mother and her sister tested positive for BRCA gene.  Ms. Cimino herself has not been tested, has not had a mammogram or colonoscopy.  Concern is certainly present for possibly malignancy in a young female with unexplained weightloss with significant family history of breast and ovarian cancer and complaints of dysphagia.  Per Dr. Jacinto Halim her weight was 228lbs 2 years ago.   -will need close outpatient follow up: strongly consider mammogram and encourage discussion of BRCA testing with Dr. Cherly Hensen -consider work up of dysphagia: barium study vs. Upper endoscopy -obtain records from pcp and Dr. Cherly Hensen office    Nausea and vomiting--frequent episodes of daily vomiting and nausea associated with episodes of SVT. Associated with weight-loss and dysphagia.  Please see above discussion.   -zofran PRN    Lightheadedness--hx of 1 syncopal episode 1 month prior and near syncopal episodes in the past.  Reports always feeling tired and hx of multiple SVT episodes including on admission.  Could be secondary to anemia, although no clear source of bleeding at this time but Hb on admission down to 8.  Decrease po  intake and dehydration with frequent vomiting and weightloss could also be etiology.  -orthostatic vital signs -IVF -AM EKG -monitor vital signs -trend Hb   Diet: clears and NPO aftermidnight per cards  DVT Ppx: Lovenox  Dispo: Disposition is deferred at this time, awaiting improvement of current medical problems. Anticipated discharge in approximately 1-2 day(s).   The patient does have a current PCP (REESE,BETTI D, MD), therefore will not be requiring OPC follow-up after discharge.   The patient does not have transportation  limitations that hinder transportation to clinic appointments.  SignedDarden Palmer 07/20/2012, 6:01 PM

## 2012-07-20 NOTE — ED Notes (Signed)
Dr. Yelverton back at the bedside 

## 2012-07-20 NOTE — ED Notes (Signed)
Dr. Yelverton at the bedside.  

## 2012-07-20 NOTE — ED Notes (Signed)
Pt here with SVT; pt with hx of same; pt sts chest tightness and SOB

## 2012-07-21 ENCOUNTER — Encounter (HOSPITAL_COMMUNITY): Payer: Self-pay | Admitting: Internal Medicine

## 2012-07-21 ENCOUNTER — Encounter (HOSPITAL_COMMUNITY)
Admission: EM | Disposition: A | Discharge status: Home / Self Care | Payer: Self-pay | Source: Home / Self Care | Attending: Internal Medicine

## 2012-07-21 ENCOUNTER — Inpatient Hospital Stay (HOSPITAL_COMMUNITY): Payer: Medicaid Other

## 2012-07-21 DIAGNOSIS — R634 Abnormal weight loss: Secondary | ICD-10-CM

## 2012-07-21 DIAGNOSIS — D649 Anemia, unspecified: Secondary | ICD-10-CM

## 2012-07-21 DIAGNOSIS — I498 Other specified cardiac arrhythmias: Principal | ICD-10-CM

## 2012-07-21 DIAGNOSIS — E876 Hypokalemia: Secondary | ICD-10-CM

## 2012-07-21 DIAGNOSIS — I471 Supraventricular tachycardia: Secondary | ICD-10-CM

## 2012-07-21 DIAGNOSIS — I10 Essential (primary) hypertension: Secondary | ICD-10-CM

## 2012-07-21 HISTORY — PX: SUPRAVENTRICULAR TACHYCARDIA ABLATION: SHX6106

## 2012-07-21 LAB — GLUCOSE, CAPILLARY: Glucose-Capillary: 85 mg/dL (ref 70–99)

## 2012-07-21 LAB — CBC
HCT: 21.5 % — ABNORMAL LOW (ref 36.0–46.0)
MCH: 27.2 pg (ref 26.0–34.0)
MCHC: 32.8 g/dL (ref 30.0–36.0)
MCV: 82.9 fL (ref 78.0–100.0)
Platelets: 150 10*3/uL (ref 150–400)
Platelets: 151 10*3/uL (ref 150–400)
RDW: 16.9 % — ABNORMAL HIGH (ref 11.5–15.5)
WBC: 3.6 10*3/uL — ABNORMAL LOW (ref 4.0–10.5)

## 2012-07-21 LAB — BLOOD GAS, ARTERIAL
Bicarbonate: 23.6 mEq/L (ref 20.0–24.0)
Drawn by: 36274
FIO2: 0.28 %
O2 Saturation: 99.7 %
pCO2 arterial: 37.2 mmHg (ref 35.0–45.0)
pH, Arterial: 7.418 (ref 7.350–7.450)
pO2, Arterial: 106 mmHg — ABNORMAL HIGH (ref 80.0–100.0)

## 2012-07-21 LAB — RAPID URINE DRUG SCREEN, HOSP PERFORMED
Amphetamines: NOT DETECTED
Barbiturates: NOT DETECTED
Benzodiazepines: NOT DETECTED
Cocaine: NOT DETECTED
Opiates: NOT DETECTED

## 2012-07-21 LAB — COMPREHENSIVE METABOLIC PANEL
Alkaline Phosphatase: 45 U/L (ref 39–117)
BUN: 7 mg/dL (ref 6–23)
GFR calc Af Amer: >90 mL/min (ref >90)
Glucose, Bld: 91 mg/dL (ref 70–99)
Potassium: 3.8 mEq/L (ref 3.5–5.1)
Total Protein: 8.5 g/dL — ABNORMAL HIGH (ref 6.0–8.3)

## 2012-07-21 LAB — LIPID PANEL
HDL: 17 mg/dL — ABNORMAL LOW (ref >39)
LDL Cholesterol: 36 mg/dL (ref 0–99)
Total CHOL/HDL Ratio: 5.2 RATIO
VLDL: 35 mg/dL (ref 0–40)

## 2012-07-21 LAB — TSH: TSH: 1.891 u[IU]/mL (ref 0.350–4.500)

## 2012-07-21 LAB — PREPARE RBC (CROSSMATCH)

## 2012-07-21 LAB — CREATININE, SERUM: Creatinine, Ser: 0.64 mg/dL (ref 0.50–1.10)

## 2012-07-21 LAB — HIV ANTIBODY (ROUTINE TESTING W REFLEX): HIV: NONREACTIVE

## 2012-07-21 SURGERY — SUPRAVENTRICULAR TACHYCARDIA ABLATION
Anesthesia: LOCAL

## 2012-07-21 MED ORDER — MIDAZOLAM HCL 5 MG/5ML IJ SOLN
INTRAMUSCULAR | Status: AC
  Filled 2012-07-21: qty 5

## 2012-07-21 MED ORDER — FENTANYL CITRATE 0.05 MG/ML IJ SOLN
INTRAMUSCULAR | Status: AC
  Administered 2012-07-22: 50 ug via INTRAVENOUS
  Filled 2012-07-21: qty 2

## 2012-07-21 MED ORDER — FUROSEMIDE 10 MG/ML IJ SOLN
40.0000 mg | Freq: Once | INTRAMUSCULAR | Status: AC
  Administered 2012-07-21: 40 mg via INTRAVENOUS
  Filled 2012-07-21: qty 4

## 2012-07-21 MED ORDER — ACETAMINOPHEN-CODEINE #3 300-30 MG PO TABS
2.0000 | ORAL_TABLET | ORAL | Status: DC | PRN
  Administered 2012-07-21: 2 via ORAL
  Filled 2012-07-21: qty 2

## 2012-07-21 MED ORDER — IOHEXOL 350 MG/ML SOLN
80.0000 mL | Freq: Once | INTRAVENOUS | Status: AC | PRN
  Administered 2012-07-21: 70 mL via INTRAVENOUS

## 2012-07-21 MED ORDER — HEPARIN SODIUM (PORCINE) 5000 UNIT/ML IJ SOLN
5000.0000 [IU] | Freq: Three times a day (TID) | INTRAMUSCULAR | Status: DC
  Filled 2012-07-21 (×2): qty 1

## 2012-07-21 MED ORDER — FENTANYL CITRATE 0.05 MG/ML IJ SOLN
25.0000 ug | Freq: Once | INTRAMUSCULAR | Status: AC
  Administered 2012-07-21: 25 ug via INTRAVENOUS

## 2012-07-21 MED ORDER — SODIUM CHLORIDE 0.9 % IJ SOLN: 3.0000 mL | INTRAMUSCULAR | Status: DC | PRN

## 2012-07-21 MED ORDER — FENTANYL CITRATE 0.05 MG/ML IJ SOLN
INTRAMUSCULAR | Status: AC
  Filled 2012-07-21: qty 2

## 2012-07-21 MED ORDER — ONDANSETRON HCL 4 MG/2ML IJ SOLN
4.0000 mg | Freq: Four times a day (QID) | INTRAMUSCULAR | Status: DC | PRN
  Administered 2012-07-21 – 2012-07-22 (×2): 4 mg via INTRAVENOUS
  Filled 2012-07-21 (×2): qty 2

## 2012-07-21 MED ORDER — SODIUM CHLORIDE 0.9 % IV SOLN: 250.0000 mL | INTRAVENOUS | Status: DC | PRN

## 2012-07-21 MED ORDER — FENTANYL CITRATE 0.05 MG/ML IJ SOLN
25.0000 ug | INTRAMUSCULAR | Status: DC | PRN
  Administered 2012-07-21 – 2012-07-22 (×2): 50 ug via INTRAVENOUS
  Filled 2012-07-21 (×3): qty 2

## 2012-07-21 MED ORDER — ACETAMINOPHEN 325 MG PO TABS: 650.0000 mg | ORAL_TABLET | ORAL | Status: DC | PRN

## 2012-07-21 MED ORDER — SODIUM CHLORIDE 0.9 % IJ SOLN: 3.0000 mL | Freq: Two times a day (BID) | INTRAMUSCULAR | Status: DC

## 2012-07-21 MED ORDER — HEPARIN (PORCINE) IN NACL 2-0.9 UNIT/ML-% IJ SOLN
INTRAMUSCULAR | Status: AC
  Filled 2012-07-21: qty 500

## 2012-07-21 MED ORDER — DEXTROSE 5 % IV SOLN
INTRAVENOUS | Status: AC
  Filled 2012-07-21: qty 250

## 2012-07-21 MED ORDER — HYDROXYUREA 500 MG PO CAPS
ORAL_CAPSULE | ORAL | Status: AC
  Filled 2012-07-21: qty 1

## 2012-07-21 NOTE — Progress Notes (Signed)
pts temperature came down to 98.6 will continue with transfusion of blood as ordered. Will monitor patient

## 2012-07-21 NOTE — H&P (Signed)
Internal Medicine Teaching Service Attending Note Date: 07/21/2012  Patient name: [name]  Medical record number: [id number]  Date of birth: 06-Apr-1975   I have seen and evaluated [name] and discussed their care with the Residency Team. Please see Dr Waynard Reeds H&P for full details. [name] is a 37 yo female who presented to the ED with CP, palpitations, chest tightness, dyspnea, and was dizzy. This occurred while eating lunch. She presented to the ED and was found to be in SVT and was converted to sinus with Adenosine.   She has had other episodes of SVT in the past two weeks that required ED care. She was seen 4/12 and vagal and carotid massage didn't break the SVT but she converted to sinus after 12 adenosine. She had seen Dr Nadara Eaton about 2 yrs ago for palp and had a nl treadmill stress test and ECHO with mild mod LVH.   She denies caffeine, drugs, tobacco, ETOH, or other stimulants.   She has been out of all meds for 4 weeks.  Other sxs include oroesophygeal dysphagia, freq vomiting, wt loss (35 lbs in 2 yrs), heavy menses, nose bleeds, cold intolerance, HA, and fatigue.  PMHx, meds, allergies, soc hx, fam hx, and ROS were reviewed. She does have HTN and at one time was on HCTZ but thinks it wasn't working. Doesn't think her mother or sisters have heavy menses although they do have SVT.   Vitals sig for HR sinus at 90 Gen : NAD, lying in bed, pleasant HEENT : EOMI, no icterus, [place]/AT, moist membranes HRRR  LCTAB ABD : + BS, S/NT/ND Ext no edema Skin ; no lesions Neuro : no focal abnl Nl mood and affect  Labs and imaging were reviewed and were sig for HgB of 8 on admit that decreased to 6.9 on recheck this AM. Prior about 11 in 2013. FOBT negative. K 3.2   Assessment and Plan: I agree with the formulated Assessment and Plan with the following changes:   1. SVT - recurrent. Pt was successfully converted in ED with adenosine. Dr Jacinto Halim has eval the pt along with Dr  Ladona Ridgel and ablation is planned for later today.   2. HTN - resume her home meds with outpt F/U. Pt states BP nl when takes them  3. Anemia - iron panel not classic for iron def but combined with hx, iron def is most likely. Replace with FeSO4 and follow ferritin as outpt. Heavy menses is the likely cause. Pt states OB Gyn has W/U for fibroids and was negative. PCP may consider W/U for [employer]. We will not check vWF levels today as her body is under stress that would affect the accuracy of the test.  4. Oroesophygeal dysphagia - can be safely W/U as outpt with videofluroscopy initially.   Burns Spain, MD 4/24/201411:05 AM

## 2012-07-21 NOTE — Progress Notes (Signed)
Cardiology attending  Called to see patient for chest pain. She had pain prior to procedure which resolved during the procedure with narcotics. She has had a bedside echo which demonstrated normal LV and RV function, no pericardial effusion, a cxr is pending. Oxygen saturations have been stable. Exam is unremarkable except for somnolence with her sedation. ECG is demonstrates no acute findings.  Will check an ABG. Will treat pain with narcotics as needed.   [name].D.

## 2012-07-21 NOTE — Progress Notes (Signed)
Medical Student Daily Progress Note  Subjective: Patient was seen by Dr. Jacinto Halim this morning, and she has agreed to an ablation this afternoon. She is feeling slightly nervous about this procedure. She has support from her mother who is at bedside this morning.  Patient did not sleep much last night. She reports having one episode of palpitations and 6/10 chest pain. She received sublingual nitroglycerin and a Cardizem drip, which has since been changed to po. An EKG performed then showed sinus tachycardia. She has not experienced palpitations since then.  Patient endorses one episode of chills, diaphoresis, hot and cold sensations, fatigue, nausea, dyspnea on exertion but not at rest, chest pain, and leg swelling L>R. She denies fevers, dizziness, lightheadedness, vomiting, abdominal pain, diarrhea, and melena. Patient has not had a bowel movement since in the hospital.  Objective: Vital signs in last 24 hours: Filed Vitals:   07/20/12 2052 07/20/12 2159 07/20/12 2208 07/21/12 0420  BP: 167/89 148/78 144/85 118/58  Pulse: 106 112 115 93  Temp: 99.1 F (37.3 C) 99.4 F (37.4 C)  98.9 F (37.2 C)  TempSrc: Oral Oral  Oral  Resp:      Height:    5\' 3"  (1.6 m)  Weight:    87.544 kg (193 lb)  SpO2: 98% 99%  99%   Weight change:   Intake/Output Summary (Last 24 hours) at 07/21/12 1047 Last data filed at 07/20/12 2110  Gross per 24 hour  Intake    222 ml  Output      0 ml  Net    222 ml   Physical Exam: General: alert, cooperative, in no apparent distress HEENT: vision grossly intact, eyebrows sparse, lips appear dry Neck: supple, healed acne scars Lungs: clear to ascultation bilaterally, normal work of respiration, no wheezes, rales or rhonchi Heart: regular rate and rhythm, 2/6 systolic murmur heard most prominently in pulmonic area Abdomen: soft, non-tender, non-distended Extremities: 2+ DP pulses bilaterally, no cyanosis or clubbing, trace pitting edema bilaterally Neurologic:  alert & oriented X3, cranial nerves grossly II-XII intact  Lab Results: Basic Metabolic Panel:  Recent Labs Lab 07/20/12 1441 07/20/12 1828 07/21/12 0615  NA 136  --  138  K 3.2*  --  3.8  CL 100  --  108  CO2 24  --  25  GLUCOSE 116*  --  91  BUN 9  --  7  CREATININE 0.79  --  0.71  CALCIUM 9.1  --  8.2*  MG  --  1.9  --    Liver Function Tests:  Recent Labs Lab 07/21/12 0615  AST 127*  ALT 29  ALKPHOS 45  BILITOT 0.3  PROT 8.5*  ALBUMIN 2.3*   CBC:  Recent Labs Lab 07/20/12 1441 07/21/12 0615  WBC 3.6* 3.6*  HGB 8.0* 6.9*  HCT 25.5* 21.5*  MCV 84.7 83.7  PLT 167 150   Cardiac Enzymes:  Recent Labs Lab 07/20/12 1828 07/21/12 0019 07/21/12 0615  TROPONINI <0.30 <0.30 <0.30   Fasting Lipid Panel:  Recent Labs Lab 07/21/12 0615  CHOL 88  HDL 17*  LDLCALC 36  TRIG 829*  CHOLHDL 5.2   Thyroid Function Tests:  Recent Labs Lab 07/20/12 1828  TSH 1.891   Anemia Panel:  Recent Labs Lab 07/20/12 1603  VITAMINB12 502  FOLATE 9.0  FERRITIN 42  TIBC 257  IRON 26*  RETICCTPCT 1.4   Urine Drug Screen: Drugs of Abuse     Component Value Date/Time   LABOPIA NONE  DETECTED 07/21/2012 0215   COCAINSCRNUR NONE DETECTED 07/21/2012 0215   LABBENZ NONE DETECTED 07/21/2012 0215   AMPHETMU NONE DETECTED 07/21/2012 0215   THCU NONE DETECTED 07/21/2012 0215   LABBARB NONE DETECTED 07/21/2012 0215    Medications: I have reviewed the patient's current medications. Scheduled Meds: . enoxaparin (LOVENOX) injection  40 mg Subcutaneous Q24H  . lisinopril  10 mg Oral Daily  . multivitamin with minerals  1 tablet Oral Daily  . sodium chloride  3 mL Intravenous Q12H   Continuous Infusions: . sodium chloride 125 mL/hr at 07/20/12 1828   PRN Meds:.acetaminophen, acetaminophen, hydrALAZINE, nitroGLYCERIN, ondansetron (ZOFRAN) IV, ondansetron  Assessment/Plan: Ms. Newcom is a 37 year old female with past medical history of HTN, SVT, and strong family  history of SVT, admitted for recurrent SVT and low Hgb.   1.  SVT (supraventricular tachycardia): Patient presented with sudden onset palpitations and diaphoresis with chest tightness. She had multiple similar episodes in the past with most recent ED visit on 07/09/12. Initial EKG in ED showed SVT, HR 160. She failed to convert with vagal maneuvers and carotid massage but did convert to sinus rhythm with 12mg  of adenosine. She has a significant family history of SVT with unknown etiology in mother and sisters. Home medications include Cardizem 120mg  qd and Lisinopril 10mg  qd, although compliance is questionable. Two years ago, she had echocardiogram that showed mild to moderate LVH and negative stress test. Dr. 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 Dr. Sharrell Ku discussed EP evaluation with patient. Patient agrees to SVT ablation today. Other possible contributing factors include anemia, thyroid, pregnancy (s/p tubal ligation), ACS, pulmonary embolus, and anxiety. Cardiac enzymes are negative x3. Lipid panel showed normal LDL (=36). Pregnancy test is negative. Although Geneva score is 5, PE is unlikely given more likely reason for tachycardia (SVT) and no PE risk factors. UDS is negative. -SVT ablation today with Dr. Jacinto Halim -NPO after breakfast -Continue home Cardizem  -Continue come Lisinopril -Obtain free T4 (TSH normal) -HgbA1c pending -Nitroglycerin prn for chest pain -O2 therapy prn for O2 sat< 92%  2.  Anemia: Hgb low at 8.0 on admission, dropped to 6.9 next day following fluid repletion. MCV in low 80s. Iron is low (26), ferritin normal (42). B12 and folate levels normal. Patient reported heavy menses up until January 2014. No obvious current source of bleeding (epistaxis, GI bleed). FOBT negative on 4/23. Given unknown etiology of anemia, will proceed with anemia workup. -Type and screened in ED -Transfuse with 1 unit whole blood today -CBC in am -d/c ASA for  risk of bleed -Workup for malignancy as outpatient  3.  Hypokalemia, resolved: Patient was hypokalemic on admission, repleted. Continue to monitor. -BMET in am  4.  Hypertension: BP was uncontrolled upon admission. Patient reports being non-adherent to medication for past month. Cardizem drip started yesterday. BP dropped to 118/58, then switched back to Cardizem po. -Continue to monitor vitals -Continue home Cardizem and Lisinopril  5.  Weight loss, unintentional: Patient notes significant weight loss over last 1.5 years. She has significant family history of cancer- mother with breast and ovarian cancer, 1 sister is BRCA positive. Patient also complains of dysphagia with solids but not liquids. -Mammogram has outpatient -Colonoscopy as outpatient -Encourage BRCA genetic testing as outpatient -GI workup as outpatient, consider video fluoroscopy for odynophagia  6.  Nausea and vomiting: Patient continues to feel nauseous. She has not vomited while in the hospital (she has been NPO or  clear liquid diet). -Zofran prn for nausea  7. F/E/N: -NS 125 cc/hr -NPO after breakfast  8. DVT ppx: Lovenox  9. Dispo: Disposition is deferred at this time, awaiting SVT ablation and recovery. Anticipated discharge in approximately 1-2 day(s).   The patient does have a current PCP Leilani Able, MD), therefore will not be requiring OCP follow-up after discharge.   The patient does not have transportation limitations that hinder transportation to clinic appointments.   LOS: 1 day   This is a Psychologist, occupational Note.  The care of the patient was discussed with Dr. Dorise Hiss and the assessment and plan formulated with their assistance.  Please see their attached note for official documentation of the daily encounter.  Cheryl Flash 07/21/2012, 10:47 AM

## 2012-07-21 NOTE — Progress Notes (Signed)
UR Completed Ellesse Antenucci Graves-Bigelow, RN,BSN [phone]  

## 2012-07-21 NOTE — Progress Notes (Signed)
MD on call Mclean as well as cardiologist Ladona Ridgel were notified of pt still being in 10/10 cp that was stabbing, & worse upon inspiration. After being notified of XR results Ladona Ridgel ordered 40 mg of IV lasix was once. An EKG as well as VS were done and are charted. Shirlee Latch came by to see the pt new orders given for a CT angio of the chest. Will continue to monitor the pt. Sanda Linger

## 2012-07-21 NOTE — Progress Notes (Signed)
Patient ID: [name], female   DOB: [birth year], 37 y.o.   MRN: [id number] Reason for Consult:SVT  Referring Physician: Kasiya Chen is an 37 y.o. female.   HPI: The patient is a 37 yo woman with a h/o tachypalpitations for years. She has had documented SVT at 160-180 beats per minute. She has had spells up to 11 hours. They start and stop suddenly, and are associate with sob. No syncope. She also gets chest pain. After her episode, she will feel weak and tired for days. She was placed on calcium channel blockers and has had recurrent symptoms. She is now referrred for ablation. Of note, both her mother and sister have SVT and have undergone ablation.  PMH: Past Medical History  Diagnosis Date  . Hypertension   . Cardiac arrhythmia   . SVT (supraventricular tachycardia)     "today, last week, 2 wk ago; maybe 1 month ago, etc; started w/in last 3-4 yrs"(07/20/2012)  . Fainting     "~ 1 month ago; probably related to SVT" (07/20/2012)  . Heart murmur     "small" (07/20/2012)  . Shortness of breath     "related to SVT episodes" (07/20/2012)  . Anemia     "today" (07/20/2012)  . Chest pain at rest     "related to SVT" (07/20/2012)  . Sleep apnea     "don't wear mask" (07/20/2012)  . Daily headache   . Anxiety     PSHX: Past Surgical History  Procedure Laterality Date  . Appendectomy  2011  . Tubal ligation  2010  . Cesarean section  2001; 2010  . Cervical biopsy  2013    FAMHX:History reviewed. No pertinent family history.  Social History:  reports that she has never smoked. She has never used smokeless tobacco. She reports that she does not drink alcohol or use illicit drugs.  Allergies:  Allergies  Allergen Reactions  . Other     Red peppers - swelling    Medications: reviewed  No results found.  ROS  As stated in the HPI and negative for all other systems.  Physical Exam  Vitals:Blood pressure 132/71, pulse 90, temperature 98.3 F (36.8 C),  temperature source Oral, resp. rate 16, height 5\' 3"  (1.6 m), weight 193 lb (87.544 kg), last menstrual period 07/10/2012, SpO2 99.00%.  Well appearing NAD HEENT: Unremarkable Neck:  No JVD, no thyromegally Lymphatics:  No adenopathy Back:  No CVA tenderness Lungs:  Clear HEART:  Regular rate rhythm, no murmurs, no rubs, no clicks Abd:  Flat, positive bowel sounds, no organomegally, no rebound, no guarding Ext:  2 plus pulses, no edema, no cyanosis, no clubbing Skin:  No rashes no nodules Neuro:  CN II through XII intact, motor grossly intact   Tele - SVT ECG - SVT at 160/min. Assessment/Plan: 1. Recurrent SVT despite medical therapy Rec: I have discussed the risks/benefits/goals/expectations of SVT ablation with the patient and she wishes to proceed.  Eileen Gowda TaylorMD 07/21/2012, 3:40 PM

## 2012-07-21 NOTE — Progress Notes (Signed)
Pts temp increased from 97.4 to 99.0. Called MD Continue with blood and check vitals again in 15 min. Blood started back at 50/hr for 12.5 volume. Pt in no distress, absent of itching rash and sob. Will continue to monitor.

## 2012-07-21 NOTE — Progress Notes (Signed)
Patient refuses CPAP.  RT will continue to monitor. 

## 2012-07-21 NOTE — Progress Notes (Signed)
INITIAL NUTRITION ASSESSMENT  DOCUMENTATION CODES Per approved criteria  -Obesity Unspecified   INTERVENTION:  No nutrition intervention at this time ---> patient declined RD to follow for nutrition care plan  NUTRITION DIAGNOSIS: Inadequate oral intake related to limited appetite as evidenced by patient report  Goal: Oral intake with meals to meet >/= 90% of estimated nutrition needs  Monitor:  PO intake, weight, labs, I/O's  Reason for Assessment: Consult  37 y.o. female  Admitting Dx: SVT (supraventricular tachycardia)  ASSESSMENT: Patient presented to ED with CP, palpitations, chest tightness, dyspnea and dizziness; reports her appetite is fair at this time; did have breakfast this AM; reports a 50 lb weight loss x 1.5 years (not significant for time frame); only consumes 1-2 meals per day; she attributes this to "not feeling hungry;" declining addition of snacks/supplements.  Height: Ht Readings from Last 1 Encounters:  07/21/12 5\' 3"  (1.6 m)    Weight: Wt Readings from Last 1 Encounters:  07/21/12 193 lb (87.544 kg)    Ideal Body Weight: 115 lb  % Ideal Body Weight: 168%  Wt Readings from Last 10 Encounters:  07/21/12 193 lb (87.544 kg)  07/21/12 193 lb (87.544 kg)    Usual Body Weight: 243 lb  % Usual Body Weight: 79%  BMI:  Body mass index is 34.2 kg/(m^2).  Estimated Nutritional Needs: Kcal: 1800-2000 Protein: 90-100 gm Fluid: 1.8-2.0 L  Skin: Intact  Diet Order: Cardiac  EDUCATION NEEDS: -No education needs identified at this time   Intake/Output Summary (Last 24 hours) at 07/21/12 1424 Last data filed at 07/21/12 1331  Gross per 24 hour  Intake    497 ml  Output      0 ml  Net    497 ml    Last BM: 4/22  Labs:   Recent Labs Lab 07/20/12 1441 07/20/12 1828 07/21/12 0615  NA 136  --  138  K 3.2*  --  3.8  CL 100  --  108  CO2 24  --  25  BUN 9  --  7  CREATININE 0.79  --  0.71  CALCIUM 9.1  --  8.2*  MG  --  1.9  --    GLUCOSE 116*  --  91    Scheduled Meds: . enoxaparin (LOVENOX) injection  40 mg Subcutaneous Q24H  . lisinopril  10 mg Oral Daily  . multivitamin with minerals  1 tablet Oral Daily  . sodium chloride  3 mL Intravenous Q12H    Continuous Infusions: . sodium chloride 125 mL/hr at 07/20/12 1610    Past Medical History  Diagnosis Date  . Hypertension   . Cardiac arrhythmia   . SVT (supraventricular tachycardia)     "today, last week, 2 wk ago; maybe 1 month ago, etc; started w/in last 3-4 yrs"(07/20/2012)  . Fainting     "~ 1 month ago; probably related to SVT" (07/20/2012)  . Heart murmur     "small" (07/20/2012)  . Shortness of breath     "related to SVT episodes" (07/20/2012)  . Anemia     "today" (07/20/2012)  . Chest pain at rest     "related to SVT" (07/20/2012)  . Sleep apnea     "don't wear mask" (07/20/2012)  . Daily headache   . Anxiety     Past Surgical History  Procedure Laterality Date  . Appendectomy  2011  . Tubal ligation  2010  . Cesarean section  2001; 2010  . Cervical biopsy  2013    Maureen Chatters, RD, LDN Pager #: [phone] After-Hours Pager #: [phone]

## 2012-07-21 NOTE — Care Management Note (Unsigned)
    Page 1 of 1   07/21/2012     11:20:10 AM   CARE MANAGEMENT NOTE 07/21/2012  Patient:  [name],[name]   Account Number:  [id number]  Date Initiated:  07/21/2012  Documentation initiated by:  GRAVES-BIGELOW,Phinehas Grounds  Subjective/Objective Assessment:   Pt admitted with cp, anxiety and SVT. IV meds administered. PER MD notes pt agreeable to ablation today. Pt has been without a job since Feb. She ran out of BP meds in [facility] March. Pt uses [facility]. CM discussed $4.00 list at [facility].     Action/Plan:   CM will continue to monitor for disposition needs. CM did receive referral for Rx needs and medicaid. FC had touched base with pt in regards to medicaid. Pt had filed in Feb. and hadn't received response.   Anticipated DC Date:  07/23/2012   Anticipated DC Plan:  HOME/SELF CARE  In-house referral  Financial Counselor      DC Planning Services  CM consult  Medication Assistance      Choice offered to / List presented to:             Status of service:  In process, will continue to follow Medicare Important Message given?   (If response is "NO", the following Medicare IM given date fields will be blank) Date Medicare IM given:   Date Additional Medicare IM given:    Discharge Disposition:    Per UR Regulation:  Reviewed for med. necessity/level of care/duration of stay  If discussed at Long Length of Stay Meetings, dates discussed:    Comments:  PCP @ [facility]: Leilani Able. 07-21-12 Tomi Bamberger, RN,BSN [phone] When pt is stable for d/c please look at all meds for affordability (generic). Pt is aware of [facility] $4.00 medications. Will f/u for additional assistance.

## 2012-07-21 NOTE — Consult Note (Signed)
CARDIOLOGY CONSULT NOTE  Patient ID: [name] MRN: [id number] DOB/AGE: [birth year] 37 y.o.  Admit date: 07/20/2012 Referring Physician  Desma Maxim, MD Primary Physician:  Karie Chimera, MD Reason for Consultation  SVT  HPI: Patient is a 37 year old African American female with history of hypertension who I had seen almost 2 years ago when she presented with chest pain, palpitations and at that time the chest pain was felt to be atypical and she has had a routine treadmill exercise stress test in which she was able to achieve 7.3 METS without evidence of ischemia and an echocardiogram had revealed normal left systolic function without any significant valvular abnormalities and mild to moderate left hypertrophy. She was lost to followup and presented to the emergency department about a week ago with palpitations and was diagnosed with SVT and converted to sinus rhythm with IV adenosine and discharged home. She was doing well until yesterday afternoon when she was having her lunch, suddenly felt her heart started to race, felt dyspneic, dizzy and had chest discomfort. When she presented to the emergency room she was again found to be in SVT and she converted to sinus rhythm with Adenocard. She is now referred to me for further evaluation. Patient is presently doing well and remains asymptomatic. Her main concern has been difficulty with swallowing, feels full very easily and sometimes has had vomiting spontaneously and she has lost significant amount of weight over the past one half years. She denies any bloody stools or dark stools, she has had excessive menstrual flow until January of this year and states that it has been normal since January. Otherwise no specific complaints. She denies any symptoms of TIA or claudication. She denies any lower extremity edema or painful swelling of the lower extremity.  Past Medical History  Diagnosis Date  . Hypertension   . Cardiac arrhythmia   . SVT  (supraventricular tachycardia)     "today, last week, 2 wk ago; maybe 1 month ago, etc; started w/in last 3-4 yrs"(07/20/2012)  . Fainting     "~ 1 month ago; probably related to SVT" (07/20/2012)  . Heart murmur     "small" (07/20/2012)  . Shortness of breath     "related to SVT episodes" (07/20/2012)  . Anemia     "today" (07/20/2012)  . Chest pain at rest     "related to SVT" (07/20/2012)  . Sleep apnea     "don't wear mask" (07/20/2012)  . Daily headache   . Anxiety      Past Surgical History  Procedure Laterality Date  . Appendectomy  2011  . Tubal ligation  2010  . Cesarean section  2001; 2010  . Cervical biopsy  2013     History reviewed. No pertinent family history.   Social History: History   Social History  . Marital Status: Single    Spouse Name: N/A    Number of Children: N/A  . Years of Education: N/A   Occupational History  . Not on file.   Social History Main Topics  . Smoking status: Never Smoker   . Smokeless tobacco: Never Used  . Alcohol Use: No     Comment: 07/20/2012 "might have a beer once/yr"  . Drug Use: No  . Sexually Active: Not Currently   Other Topics Concern  . Not on file   Social History Narrative  . No narrative on file     Prescriptions prior to admission  Medication Sig Dispense Refill  . diltiazem (CARDIZEM)  120 MG tablet Take 1 tablet (120 mg total) by mouth daily.  30 tablet  1  . ibuprofen (ADVIL,MOTRIN) 400 MG tablet Take 400 mg by mouth daily.      Marland Kitchen lisinopril (PRINIVIL,ZESTRIL) 10 MG tablet Take 1 tablet (10 mg total) by mouth daily.  10 tablet  0  . Multiple Vitamin (MULTIVITAMIN WITH MINERALS) TABS Take 1 tablet by mouth daily.      . potassium chloride SA (K-DUR,KLOR-CON) 20 MEQ tablet Take 1 tablet (20 mEq total) by mouth daily.  30 tablet  0    Scheduled Meds: . enoxaparin (LOVENOX) injection  40 mg Subcutaneous Q24H  . lisinopril  10 mg Oral Daily  . multivitamin with minerals  1 tablet Oral Daily  . sodium  chloride  3 mL Intravenous Q12H   Continuous Infusions: . sodium chloride 125 mL/hr at 07/20/12 1828   PRN Meds:.acetaminophen, acetaminophen, hydrALAZINE, nitroGLYCERIN, ondansetron (ZOFRAN) IV, ondansetron  ROS: General: no fevers/chills/night sweats, has had significant weight loss of more than 40-50 pounds in the last year and a half. Unintentional. Eyes: no blurry vision, diplopia, or amaurosis ENT: no sore throat or hearing loss Resp: no cough, wheezing, or hemoptysis CV: no edema or palpitations GI: Has had nausea and occasional vomiting and easy satiety. GU: no dysuria, frequency, or hematuria, had menorrhagia about a year ago, has now normalized since January of 2014. Skin: no rash Neuro: no headache, numbness, tingling, or weakness of extremities Musculoskeletal: no joint pain or swelling Heme: no bleeding, DVT, or easy bruising Endo: no polydipsia or polyuria    Physical Exam: Blood pressure 118/58, pulse 93, temperature 98.9 F (37.2 C), temperature source Oral, resp. rate 24, height 5\' 3"  (1.6 m), weight 87.544 kg (193 lb), last menstrual period 07/10/2012, SpO2 99.00%.   General appearance: alert, cooperative, appears older than stated age, no distress and moderately obese Lungs: clear to auscultation bilaterally Heart: S1, S2 normal and 1-2/6 systolic ejection murmur in the pulmonary area without any gallops or rub. Abdomen: soft, non-tender; bowel sounds normal; no masses,  no organomegaly and Pannus present Extremities: extremities normal, atraumatic, no cyanosis or edema and Homans sign is negative, no sign of DVT Neurologic: Grossly normal  Labs:   Lab Results  Component Value Date   WBC 3.6* 07/21/2012   HGB 6.9* 07/21/2012   HCT 21.5* 07/21/2012   MCV 83.7 07/21/2012   PLT 150 07/21/2012    Recent Labs Lab 07/21/12 0615  NA 138  K 3.8  CL 108  CO2 25  BUN 7  CREATININE 0.71  CALCIUM 8.2*  PROT 8.5*  BILITOT 0.3  ALKPHOS 45  ALT 29  AST 127*   GLUCOSE 91   Lab Results  Component Value Date   CKTOTAL 1260* 08/24/2010   CKMB 20.7 CRITICAL VALUE NOTED.  VALUE IS CONSISTENT WITH PREVIOUSLY REPORTED AND CALLED VALUE.* 08/24/2010   TROPONINI <0.30 07/21/2012    Lipid Panel     Component Value Date/Time   CHOL 88 07/21/2012 0615   TRIG 173* 07/21/2012 0615   HDL 17* 07/21/2012 0615   CHOLHDL 5.2 07/21/2012 0615   VLDL 35 07/21/2012 0615   LDLCALC 36 07/21/2012 0615    EKG: EKG 07/21/2012 reveals SVT at a rate of 180 beats per minute, nonspecific T changes. Post-SVT termination EKG reveals normal sinus rhythm/sinus tachycardia, normal axis, normal intervals without evidence of ischemia.    ASSESSMENT AND PLAN:  1. 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 Unintentional weight loss, suspect esophageal stricture versus esophageal web, also has probable iron deficiency anemia. Normal B12 and folate.  3. Anemia, etiology not known at this time. 4. Hyperglycemia 5. Mild hypertrophic ischemia 6. Hypertransaminasemia, probably fatty liver  Recommendation: Patient was initially reluctant for SVT ablation, patient has not been seen by Dr. Sharrell Ku and after discussed with her that I have already had discussions with Dr. Ladona Ridgel, patient appears to be much more conducive and she wants to proceed with EP evaluation and possible ablation of SVT. She states that she has had recurrent episodes recently and states that she is unable to let this. With regard to esophageal/swallowing problems, I suspect she probably has esophageal web or esophageal stricture, I cannot completely exclude whether she may have lateral ulcer or pyloric stenosis due to the fact that she feels full very easily and has to throw up. She will need further evaluation with regard to GI. Thanks for asking me to see this pleasant patient.   Pamella Pert, MD 07/21/2012, 8:48 AM [facility].  [place] Pager: [phone] Office: [phone] If no answer Cell [phone]

## 2012-07-21 NOTE — Progress Notes (Signed)
Pt complained of chest pain 6/10. Vitals signs T 99.4, P 112, R 19 BP 148/78, 99 % RA. EKG showed ST. Dr. On call notified and made aware. 1 sublingual nitro ordered and administered. Cardizem drip ordered and administered. Will continue to monitor patient.

## 2012-07-21 NOTE — Progress Notes (Signed)
Resident Co-sign Daily Note: I have seen the patient and reviewed the daily progress note by Cheryl Flash MS 4 and discussed the care of the patient with them.  See below for documentation of my findings, assessment, and plans.  Please note that due to EPIC error I am unable to co-sign note by Cheryl Flash and this note is the co-sign for that note.   Subjective: The patient is lying in bed with her mother in the room. She is feeling well this morning but has very minimal chest discomfort. She was having some discomfort overnight and was placed briefly on cardiazem drip. She is now off that. She talked with cardiology and would like to proceed with ablation for these recurrent SVT episodes.   She was interested in hearing about screening as an out-patient to evaluate the weight loss and iron deficiency including mammogram, OB/Gyn evaluation, possibly BRCA testing, colonoscopy.  Talked about need to control her blood pressure and if she has sugar problems to follow up those as well.   Objective: Vital signs in last 24 hours: Filed Vitals:   07/21/12 0420 07/21/12 1115 07/21/12 1145 07/21/12 1215  BP: 118/58 137/67 146/77 143/73  Pulse: 93 90 90 88  Temp: 98.9 F (37.2 C) 97.4 F (36.3 C) 99 F (37.2 C) 98.6 F (37 C)  TempSrc: Oral Oral Oral Oral  Resp:  14 14 16   Height: 5\' 3"  (1.6 m)     Weight: 193 lb (87.544 kg)     SpO2: 99%      Physical Exam: General: resting in bed, lying in bed HEENT: PERRL, EOMI, no scleral icterus Cardiac: RRR, 1-2/6 systolic murmur over the right sternal border Pulm: clear to auscultation bilaterally, moving normal volumes of air Abd: soft, nontender, nondistended, BS present, obese Ext: warm and well perfused, no pedal edema Neuro: alert and oriented X3, cranial nerves II-XII grossly intact  Lab Results: Reviewed and documented in Electronic Record Micro Results: Reviewed and documented in Electronic Record Studies/Results: Reviewed and documented in  Electronic Record Medications: I have reviewed the patient's current medications. Scheduled Meds: . enoxaparin (LOVENOX) injection  40 mg Subcutaneous Q24H  . lisinopril  10 mg Oral Daily  . multivitamin with minerals  1 tablet Oral Daily  . sodium chloride  3 mL Intravenous Q12H   Continuous Infusions: . sodium chloride 125 mL/hr at 07/20/12 1828   PRN Meds:.acetaminophen, acetaminophen, hydrALAZINE, nitroGLYCERIN, ondansetron (ZOFRAN) IV, ondansetron Assessment/Plan:  SVT (supraventricular tachycardia) - She did convert to sinus rhthynn in the ED with adenosine 12 mg. She has been resumed on her home dose of diltiazem 120 mg daily. Will get ablation with EP today to prevent SVT. Strong familial history of SVT with mother and sister. Could have been exacerbated by not taking meds for 1 month. 2 years ago she had Echo with LVH and stress test negative.  -SVT ablation today with Dr. Jacinto Halim  -NPO after breakfast  -Appreciate cardiology input whether patient needs diltiazem long term if she will be ablated.  -Obtain free T4 (TSH normal)  -HgbA1c pending   Anemia - Hg 6.9 this morning and will transfuse 1 unit PRBC this afternoon prior to ablation. Likely decreased from yesterday secondary to dilutional. Will continue to monitor. Not bleeding currently. Not on her period, FOBT negative, no nosebleeds.   Hypokalemia - Resolved this morning K 3.8.  Hypertension - Hypotensive this morning to 118/50s and will monitor on home meds. She was on cardiazem drip overnight which could have  dropped blood pressures. She was not on home meds for > 1 month.   Weight loss, unintentional - Will likely need to have out-patient evaluation. She is down about 35 pounds in 2 years since last documented weight with Dr. Jacinto Halim. She will need GI workup with EGD and colonoscopy to rule out stricture or webb in esophagus causing decreased PO intake. She will also need to be offered BRCA testing and would be recommended  mammogram given mother's diagnosis of breast Ca at age 51.  DVT ppx - Lovenox [place] daily   LOS: 1 day   Genella Mech 07/21/2012, 1:06 PM

## 2012-07-21 NOTE — Op Note (Signed)
EPS/RFA of SVT performed without immediate complication. [id number].

## 2012-07-22 DIAGNOSIS — I10 Essential (primary) hypertension: Secondary | ICD-10-CM

## 2012-07-22 LAB — TYPE AND SCREEN: Unit division: 0

## 2012-07-22 LAB — CBC
HCT: 24.6 % — ABNORMAL LOW (ref 36.0–46.0)
Hemoglobin: 8 g/dL — ABNORMAL LOW (ref 12.0–15.0)
MCHC: 32.5 g/dL (ref 30.0–36.0)
MCV: 83.4 fL (ref 78.0–100.0)
RBC: 2.95 MIL/uL — ABNORMAL LOW (ref 3.87–5.11)
RDW: 17.1 % — ABNORMAL HIGH (ref 11.5–15.5)
WBC: 3.7 10*3/uL — ABNORMAL LOW (ref 4.0–10.5)

## 2012-07-22 LAB — PRO B NATRIURETIC PEPTIDE: Pro B Natriuretic peptide (BNP): 521.2 pg/mL — ABNORMAL HIGH (ref 0–125)

## 2012-07-22 MED ORDER — FERROUS GLUCONATE 216 MG PO TABS: 216.0000 mg | ORAL_TABLET | Freq: Three times a day (TID) | ORAL | Status: DC

## 2012-07-22 MED ORDER — NAPROXEN 500 MG PO TABS: 500.0000 mg | ORAL_TABLET | Freq: Two times a day (BID) | ORAL | Status: DC

## 2012-07-22 NOTE — Discharge Summary (Signed)
Internal Medicine Teaching [facility] Discharge Note  Name: [name] MRN: [id number] DOB: [birth year] 37 y.o.  Date of Admission: 07/20/2012  2:42 PM Date of Discharge: 07/22/2012 Attending Physician: Burns Spain, MD  Discharge Diagnosis: Principal Problem:   SVT (supraventricular tachycardia) Active Problems:   Anemia   Hypokalemia   Hypertension   Weight loss, unintentional   Nausea and vomiting   Discharge Medications:   Medication List    STOP taking these medications       ibuprofen 400 MG tablet  Commonly known as:  ADVIL,MOTRIN     potassium chloride SA 20 MEQ tablet  Commonly known as:  K-DUR,KLOR-CON      TAKE these medications       diltiazem 120 MG tablet  Commonly known as:  CARDIZEM  Take 1 tablet (120 mg total) by mouth daily.     ferrous gluconate 216 MG tablet  Commonly known as:  FERGON  Take 1 tablet (216 mg total) by mouth 3 (three) times daily with meals.     lisinopril 10 MG tablet  Commonly known as:  PRINIVIL,ZESTRIL  Take 1 tablet (10 mg total) by mouth daily.     multivitamin with minerals Tabs  Take 1 tablet by mouth daily.     naproxen 500 MG tablet  Commonly known as:  NAPROSYN  Take 1 tablet (500 mg total) by mouth 2 (two) times daily with a meal.        Disposition and follow-up:   [name] was discharged from [facility] in Stable condition.  At the hospital follow up visit please address symptoms of recurrent SVT and follow up on weight loss, lymphadenopathy, need for mammogram.  Follow-up Appointments:     Follow-up Information   Schedule an appointment as soon as possible for a visit with Karie Chimera, MD.   Contact information:   [address] [place] [address] [phone]       Call Pamella Pert, MD. (for an office visit in 2-4 weeks)    Contact information:   [address] [place] [address] [phone]       Discharge Orders   Future Orders Complete By Expires     Call MD for:  persistant dizziness or light-headedness  As directed     Call MD for:  As directed     Scheduling Instructions:      Rapid heart rate or chest pain.    Diet - low sodium heart healthy  As directed     Increase activity slowly  As directed        Consultations:  Cardiology, Dr. Jacinto Halim and Dr. Ladona Ridgel  Procedures Performed:  Ct Angio Chest Pe W/cm &/or Wo Cm  07/22/2012  *RADIOLOGY REPORT*  Clinical Data: Chest pain, shortness of breath  CT ANGIOGRAPHY CHEST  Technique:  Multidetector CT imaging of the chest using the standard protocol during bolus administration of intravenous contrast. Multiplanar reconstructed images including MIPs were obtained and reviewed to evaluate the vascular anatomy.  Contrast: 70mL OMNIPAQUE IOHEXOL 350 MG/ML SOLN the  Comparison: 07/21/2012 radiograph, 01/16/2010 CT  Findings: Suboptimal contrast bolus timing, with much of the contrast in the left upper extremity and SVC.  Allowing for this, no main branch pulmonary arterial filling defect.  Distal lobar and segmental/subsegmental branches are suboptimally opacified.  Cardiomegaly.  Small pericardial effusion.  Small right pleural effusion.  Axillary lymphadenopathy, measuring up to 1.7 cm on the right and  2.0 cm on the left.  Enlarged right hilar lymph node, measuring 1.4 cm and subcarinal adenopathy, difficult to separate from the esophagus.  Limited images through the upper abdomen show no acute finding.  6 mm filling defect along the right anterior wall of the superior trachea on image 7.  Central airways otherwise patent.  Bibasilar opacities, right greater than left.  No pneumothorax.  4 mm nodule right upper lobe (image 40).  No acute osseous finding.  IMPRESSION: Suboptimal contrast bolus timing.  No central pulmonary embolism. Distal lobar more peripheral branches are poorly opacified, limiting evaluation.  Cardiomegaly and small pericardial  effusion.  Bibasilar opacities, right greater than left; atelectasis, aspiration, or infiltrate.  Small right pleural effusion.  Biaxillary, mediastinal, right hilar lymphadenopathy. Recommend clinical correlation and consider tissue sampling of an axillary node.  4 mm nodule right upper lobe. Recommend attention with a 1-year follow-up.  Filling defect along the anterior superior wall of the trachea, favored to reflect mucus/secretions.  Attention on follow-up.   Original Report Authenticated By: Jearld Lesch, M.D.    Dg Chest Port 1 View  07/21/2012  *RADIOLOGY REPORT*  Clinical Data: Pleuritic chest pain.  PORTABLE CHEST - 1 VIEW  Comparison: 07/07/2011.  Findings: The heart is enlarged.  There is moderate vascular congestion and probable mild interstitial pulmonary edema.  No definite pleural effusions or pneumothorax.  IMPRESSION: Cardiac enlargement with vascular congestion and mild interstitial pulmonary edema.   Original Report Authenticated By: Rudie Meyer, M.D.    Ablation of accessory pathway: Operating Doctor: Dr. 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  INTRODUCTION: The patient is a 37 year old woman with a long history of  tachy palpitations dating back many years. She has had episodes of SVT  despite medical therapy with calcium channel blockers. The patient  presented to the hospital with sustained SVT and was treated with  adenosine. She had chest pain associated with her SVT and ruled out for  an MI. She is now referred for electrophysiologic study and catheter  ablation.  PROCEDURE IN DETAIL: After informed consent was obtained, the patient  was taken to the diagnostic EP lab in a fasting state. After usual  preparation and draping, a 6-French hexapolar catheter was inserted  percutaneously in the right jugular vein and advanced  to the coronary  sinus. A 6-French quadripolar catheter was inserted percutaneously in  the right femoral vein and advanced to the right ventricle. A 6-French  quadripolar catheter was inserted percutaneously in the right femoral  vein and advanced to the His bundle region. After measurement of the  basic intervals, rapid ventricular pacing was carried out from the right  ventricle demonstrating VA dissociation at 600 milliseconds. Programmed  ventricular stimulation was carried out demonstrating VA dissociation at  600 milliseconds. Programmed atrial stimulation was then carried out at  a base drive cycle length of 914 milliseconds. S1 and S2 interval  stepwise decreased down to 310 milliseconds where the AV node ERP was  observed. During programmed atrial stimulation, there were multiple  agents jumps and echo beats, but initially no SVT. Next, rapid atrial  pacing was carried out from the right atrium and the coronary sinus at a  base drive cycle length of 782 milliseconds and stepwise decreased down  to 380 milliseconds where AV Wenckebach was observed. Next,  isoproterenol was infused at 1-3 mcg per  minute. Programmed atrial  stimulation was carried out from the atrium at a base drive cycle length  of 161 milliseconds. The S1 and S2 interval stepwise decreased down to  280 milliseconds resulting in the initiation of SVT. This was short RP  tachycardia with midline atrial activation and a very short VA interval.  During tachycardia, which had a cycle length of 410 milliseconds, PVCs  placed at the time of His bundle refractoriness did not pre-excite the  atrium and during ventricular pacing in tachycardia, there was a VA  activation sequence. All of the above, confirmed a diagnosis of AV node  reentrant tachycardia. At this point, a 7-French quadripolar ablation  catheter was then maneuvered into the right femoral vein and advanced  into the right atrium. Mapping of Koch's triangle was  carried out. It  was larger than usual, but fairly short. A total of 5 RF energy  applications were delivered to site 8-10 in Koch's triangle, and this  demonstrated accelerated junctional rhythm. Following catheter  ablation, rapid atrial pacing, and programmed atrial stimulation was  again carried out from the atrium and there was no inducible SVT. This  was both with and without isoproterenol. Programmed atrial stimulation  was carried out demonstrating residual echo beats, but no double echo  beats, and no SVT. At this point, the patient was observed for  approximately 20 minutes and had no recurrent SVT despite an aggressive  pacing protocol. The catheters were then removed. Hemostasis was  assured. The patient was returned to her room in satisfactory  condition.  COMPLICATIONS: There were no immediate procedure complications.  RESULTS: A. Baseline ECG demonstrates sinus rhythm with normal axis  and intervals. There was no pre-excitation.  B. Baseline intervals, sinus node cycle length was 676 milliseconds and  QRS duration was 85 milliseconds. The QT interval is 240 millisecond.  C. The PR interval was 150 milliseconds.  D. Rapid ventricular pacing. Rapid atrial pacing was carried out from  the right ventricle demonstrating VA dissociation at baseline pacing  interval of 600 milliseconds.  E. Deep programmed ventricular stimulation. Programmed ventricular  stimulation was carried out from the right ventricle at base drive cycle  length of 096 milliseconds demonstrating VA dissociation.  F. Rapid atrial pacing. Rapid atrial pacing was carried out from the  atrium and coronary sinus at base drive cycle length of 045 milliseconds  and stepwise decreased down to 380 milliseconds where AV Wenckebach was  observed. During rapid atrial pacing the PR interval was less than the  RR interval. Following catheter ablation.  G. Programmed atrial stimulation. Programmed atrial stimulation was   carried out from the atrium at a base drive cycle length of 409 and 500  milliseconds. The S1-S2 interval stepwise decreased down atrial  refractoriness. At an S1-S2 coupling interval of 500/280, there was  inducible SVT. Following catheter ablation, there was no inducible SVT,  though there was residual echo beats.  H. Arrhythmias observed.  1. AV node reentrant tachycardia initiation of programmed atrial  stimulation, decoration was sustained. Cycle length was 410  milliseconds. Method of termination with coronary sinus pacing.  a. Mapping. Mapping of AV nodal reentrant tachycardia  demonstrated a larger than usual Koch's triangle.  I. RF energy application. A total of 5 RF energy applications  delivered to site 8 in Koch's triangle resulting in  accelerated junctional rhythm. Following RF energy  application, there was no inducible SVT.  CONCLUSION: The study demonstrates successful electrophysiologic study  and RF catheter  ablation of AV node reentrant tachycardia with a total  of 5 hour energy applications delivered to site 8 in Koch's triangle.   Admission HPI:  Ms. Paulhus is a 37 year old African American female with PMH of uncontrolled HTN, SVT, headaches, and anxiety presenting to the ED today with complaints of sudden onset chest palpitations, shortness of breath, sweating, chest tightness, and nausea during lunch this afternoon. He chest tightness is localized to left breast region, radiating occasionally to right arm in the past but currently non-radiating, and tender to palpation. This is her third episode in the past 2 weeks. Her last similar episode like this was last week when she came to the ED as well on 07/09/12. At that time, she was found to be in SVT with HR in 140-150s and eventually HR improved to 100s with 12mg  adenosine. Today, on admission her EKG revealed HR 160bpm SVT. She failed to convert to sinus rhythm with vagal maneuvers and carotid massage and eventually  converted to sinus tachycardia with 12mg  of adenosine.   Ms. Fiorillo also endorses daily headaches, cold intolerance, always feeling tired, lightheadedness, syncopal episode x1 last month, and frequent vomiting. She has decreased appetite and eats smaller portions. When she does "over eat" she feels like her food gets stuck in her throat and then ends up vomiting without trying. She has no trouble with liquids. She denies any significant blood with her vomit but did notice 1 or 2 drops in the past and claims the food is at times un-digested and acidic. She also has intermittent nosebleeds approximately once a month. Denies any blood in her stool. Un-intentional weightloss over the past year, was a size 22 a year ago and down to size 12 currently. Per Dr. Jacinto Halim, her cardiologist, she was last seen 2 years ago by him and at that time her weight was 228lbs and down to 193 lbs today.   Of note, we discussed with Dr. Jacinto Halim on the phone today and informed that her last Echo was 09/2010: EF normal and study significant for LVH and a negative exercise stress test as well at that time. She has a significant family history of SVT including her mother and sisters. Her family history is also significant for breast and ovarian cancer and anemia requiring iron transfusions in her mother who is now in remission and 1 of 2 sisters testing positive for the BRCA gene. Ms. Maret herself has not been tested for BRCA, has never had a mammogram or a colonoscopy.   GYN hx: G2P2 both c-section deliveries, LMP last week, hx of irregular menses with heavy bleeding (has had cycles with changing up to 1 pad per hour), follows with Dr. Cherly Hensen from [facility], s/p tubal ligation 2010.   Hospital Course by problem list:   SVT (supraventricular tachycardia) - The patient came in with SVT and was failed to convert with vagal maneuvers. She was then given 12 mg adenosine which did convert her to sinus rhytmn. She was then still experiencing some  chest tightness and had CE times 3 which were all normal. Her cardiologist, Dr. Jacinto Halim, was consulted. He did come to see her and had her evaluated by EP for possible ablation given her recent increase in bouts of SVT. She had also been non-compliant with her medications for 1 month prior to hospital stay. She was agreeable to ablation and the decision was made to proceed given strong family history. She was taken for successful ablation and discharged home the following  day with no complications from the procedure. She did have 1 unit PRBC during this hospital stay (see below for full details). She was discharged home with her diltiazem 120 mg daily and instructions to follow up with Dr. Jacinto Halim in 2-4 weeks.   Anemia - Anemia panel checked and B12, folate normal. Other labs would indicate chronic iron deficiency and the patient was started on iron. Given her heavy periods this could be the source however given family history of cancer it may be reasonable for her to undergo out-patient GI evaluation. She was FOBT negative during this stay. Her initial hemoglobin did decrease from 8 to 6.9 and this was likely dilutional as we did not have a source for bleeding during this stay and she did improve and stabilize with 1 unit PRBC. She was advised to follow up with her regular doctor and that she may need GI evaluation in the near future.   Hypokalemia - Likely secondary to her vomiting and lack of nutrition as an out-patient although she was able to tolerate a diet here without vomiting. Supplementation did help and magnesium was checked and found to be normal.   Hypertension - Her blood pressures were elevated during this admission and she was off all of her home medications including diltiazem and lisinopril which were restarted during this admission. She was still slightly hypertensive at discharge (150s/90s) which warrants possible regimen adjustment as an out-patient at follow up.   Weight loss,  unintentional - Per patient she was lost about 10 dress sizes although Dr. Jacinto Halim was able to provide [place] with an old weight and shows that she has lost about 35 pounds in 2 years unintentionally. She does admit to some nausea and vomiting if she eats too much and states that she is having some early satiety. She was found to have some lymphadenopathy in her axilla and parahilar which should be examined and possibly biopsied as an out-patient. She may be at risk for sarcoid given the parahilar nodes. She is also at strong risk for breast cancer given axillary lymph nodes although no detectable masses on physical exam. She has not had a mammogram and she was told that she NEEDS one as an out-patient given mother's breast cancer around age 29. Other concern is that vomiting could be limiting calories absorbed. She may need EGD to evaluate the dysphagia.   Discharge Vitals:  BP 151/81  Pulse 88  Temp(Src) 98.2 F (36.8 C) (Oral)  Resp 16  Ht 5\' 3"  (1.6 m)  Wt 190 lb 12.8 oz (86.546 kg)  BMI 33.81 kg/m2  SpO2 98%  LMP 07/10/2012  Discharge Labs:  Results for orders placed during the hospital encounter of 07/20/12 (from the past 24 hour(s))  GLUCOSE, CAPILLARY     Status: None   Collection Time    07/21/12  7:16 PM      Result Value Range   Glucose-Capillary 85  70 - 99 mg/dL  BLOOD GAS, ARTERIAL     Status: Abnormal   Collection Time    07/21/12  7:54 PM      Result Value Range   FIO2 0.28     Delivery systems NASAL CANNULA     pH, Arterial 7.418  7.350 - 7.450   pCO2 arterial 37.2  35.0 - 45.0 mmHg   pO2, Arterial 106.0 (*) 80.0 - 100.0 mmHg   Bicarbonate 23.6  20.0 - 24.0 mEq/L   TCO2 24.7  0 - 100 mmol/L   Acid-base deficit 0.3  0.0 - 2.0 mmol/L   O2 Saturation 99.7     Patient temperature 98.6     Collection site RIGHT RADIAL     Drawn by [phone]     Sample type ARTERIAL DRAW     Allens test (pass/fail) PASS  PASS  CBC     Status: Abnormal   Collection Time    07/21/12  9:09 PM       Result Value Range   WBC 5.0  4.0 - 10.5 K/uL   RBC 3.16 (*) 3.87 - 5.11 MIL/uL   Hemoglobin 8.6 (*) 12.0 - 15.0 g/dL   HCT 40.9 (*) 81.1 - 91.4 %   MCV 82.9  78.0 - 100.0 fL   MCH 27.2  26.0 - 34.0 pg   MCHC 32.8  30.0 - 36.0 g/dL   RDW 78.2 (*) 95.6 - 21.3 %   Platelets 151  150 - 400 K/uL  CREATININE, SERUM     Status: None   Collection Time    07/21/12  9:09 PM      Result Value Range   Creatinine, Ser 0.64  0.50 - 1.10 mg/dL   GFR calc non Af Amer >90  >90 mL/min   GFR calc Af Amer >90  >90 mL/min  PRO B NATRIURETIC PEPTIDE     Status: Abnormal   Collection Time    07/22/12  5:27 AM      Result Value Range   Pro B Natriuretic peptide (BNP) 521.2 (*) 0 - 125 pg/mL  CBC     Status: Abnormal   Collection Time    07/22/12  5:27 AM      Result Value Range   WBC 3.7 (*) 4.0 - 10.5 K/uL   RBC 2.95 (*) 3.87 - 5.11 MIL/uL   Hemoglobin 8.0 (*) 12.0 - 15.0 g/dL   HCT 08.6 (*) 57.8 - 46.9 %   MCV 83.4  78.0 - 100.0 fL   MCH 27.1  26.0 - 34.0 pg   MCHC 32.5  30.0 - 36.0 g/dL   RDW 62.9 (*) 52.8 - 41.3 %   Platelets 149 (*) 150 - 400 K/uL    Signed: Genella Mech 07/22/2012, 1:13 PM   Time Spent on Discharge: 28 minutes Services Ordered on Discharge: none Equipment Ordered on Discharge: none

## 2012-07-22 NOTE — Progress Notes (Signed)
There is a note in the chart that was documented for the co-sign as EPIC was failing to allow me to co-sign yesterday. I agree with the medical student's note with the changes documented in my progress note from 07/21/12.  KOLLAR, ELIZABETH 1:21 PM 07/22/2012

## 2012-07-22 NOTE — Progress Notes (Signed)
Medical Student Daily Progress Note  Subjective: Prior to the ablation yesterday, patient had chest pain which was partially alleviated with pain medications. She had a successful ablation procedure. After patient's ablation ~8pm, she complained of 10/10 stabbing chest pain without dyspnea or tachycardia. A portable CXR showed possible cardiomegaly, no infiltrates. She received Lasix 40. CT chest showed no central PE (distal PE cannot be ruled out), small R pleural effusion, possible small pericardial effusion, hilar lymph node, and axillary lymph nodes.   Patient continues to complain of chest pain, now 6/10. She points to the middle of her sternum as the location of her pain, which can be focally elicited with palpation. She qualitatively describes her pain as inside her chest cavity rather than on the surface of her chest. Otherwise, she has been feeling the same with dyspnea on exertion, fatigue. Denies dizziness, lightheadedness, vomiting, or diarrhea.  Objective: Vital signs in last 24 hours: Filed Vitals:   07/21/12 2345 07/22/12 0145 07/22/12 0402 07/22/12 0927  BP: 150/80 148/82 149/82 151/81  Pulse: 86 84 85 88  Temp: 97.6 F (36.4 C) 98.3 F (36.8 C) 98 F (36.7 C) 98.2 F (36.8 C)  TempSrc: Oral Oral Oral Oral  Resp: 16 16  16   Height:      Weight:   86.546 kg (190 lb 12.8 oz)   SpO2: 99% 100% 100% 98%   Weight change: -1.361 kg (-3 lb)  Intake/Output Summary (Last 24 hours) at 07/22/12 1100 Last data filed at 07/22/12 0700  Gross per 24 hour  Intake    500 ml  Output   1225 ml  Net   -725 ml   Physical Exam: General: alert, cooperative, in no apparent distress Neck: supple, healed acne scars Lungs: clear to ascultation bilaterally, normal work of respiration, no wheezes, rales or rhonchi Heart: regular rate and rhythm, 2/6 systolic murmur heard most prominently in pulmonic area, tenderness to palpation in mid-sternum Abdomen: soft, non-tender, non-distended   Extremities: 2+ DP pulses bilaterally, no cyanosis or clubbing or edema Neurologic: alert & oriented X3, cranial nerves grossly II-XII intact  Lab Results: Basic Metabolic Panel:  Recent Labs Lab 07/20/12 1441 07/20/12 1828 07/21/12 0615 07/21/12 2109  NA 136  --  138  --   K 3.2*  --  3.8  --   CL 100  --  108  --   CO2 24  --  25  --   GLUCOSE 116*  --  91  --   BUN 9  --  7  --   CREATININE 0.79  --  0.71 0.64  CALCIUM 9.1  --  8.2*  --   MG  --  1.9  --   --    Liver Function Tests:  Recent Labs Lab 07/21/12 0615  AST 127*  ALT 29  ALKPHOS 45  BILITOT 0.3  PROT 8.5*  ALBUMIN 2.3*   CBC:  Recent Labs Lab 07/21/12 2109 07/22/12 0527  WBC 5.0 3.7*  HGB 8.6* 8.0*  HCT 26.2* 24.6*  MCV 82.9 83.4  PLT 151 149*   Cardiac Enzymes:  Recent Labs Lab 07/20/12 1828 07/21/12 0019 07/21/12 0615  TROPONINI <0.30 <0.30 <0.30   BNP:  Recent Labs Lab 07/22/12 0527  PROBNP 521.2*   CBG:  Recent Labs Lab 07/21/12 1916  GLUCAP 85   Fasting Lipid Panel:  Recent Labs Lab 07/21/12 0615  CHOL 88  HDL 17*  LDLCALC 36  TRIG 960*  CHOLHDL 5.2   Thyroid Function  Tests:  Recent Labs Lab 07/20/12 1828 07/21/12 0850  TSH 1.891  --   FREET4  --  1.02   Anemia Panel:  Recent Labs Lab 07/20/12 1603  VITAMINB12 502  FOLATE 9.0  FERRITIN 42  TIBC 257  IRON 26*  RETICCTPCT 1.4   Urine Drug Screen: Drugs of Abuse     Component Value Date/Time   LABOPIA NONE DETECTED 07/21/2012 0215   COCAINSCRNUR NONE DETECTED 07/21/2012 0215   LABBENZ NONE DETECTED 07/21/2012 0215   AMPHETMU NONE DETECTED 07/21/2012 0215   THCU NONE DETECTED 07/21/2012 0215   LABBARB NONE DETECTED 07/21/2012 0215    Micro Results: No results found for this or any previous visit (from the past 240 hour(s)). Studies/Results: Ct Angio Chest Pe W/cm &/or Wo Cm  07/22/2012  *RADIOLOGY REPORT*  Clinical Data: Chest pain, shortness of breath  CT ANGIOGRAPHY CHEST  Technique:   Multidetector CT imaging of the chest using the standard protocol during bolus administration of intravenous contrast. Multiplanar reconstructed images including MIPs were obtained and reviewed to evaluate the vascular anatomy.  Contrast: 70mL OMNIPAQUE IOHEXOL 350 MG/ML SOLN the  Comparison: 07/21/2012 radiograph, 01/16/2010 CT  Findings: Suboptimal contrast bolus timing, with much of the contrast in the left upper extremity and SVC.  Allowing for this, no main branch pulmonary arterial filling defect.  Distal lobar and segmental/subsegmental branches are suboptimally opacified.  Cardiomegaly.  Small pericardial effusion.  Small right pleural effusion.  Axillary lymphadenopathy, measuring up to 1.7 cm on the right and 2.0 cm on the left.  Enlarged right hilar lymph node, measuring 1.4 cm and subcarinal adenopathy, difficult to separate from the esophagus.  Limited images through the upper abdomen show no acute finding.  6 mm filling defect along the right anterior wall of the superior trachea on image 7.  Central airways otherwise patent.  Bibasilar opacities, right greater than left.  No pneumothorax.  4 mm nodule right upper lobe (image 40).  No acute osseous finding.  IMPRESSION: Suboptimal contrast bolus timing.  No central pulmonary embolism. Distal lobar more peripheral branches are poorly opacified, limiting evaluation.  Cardiomegaly and small pericardial effusion.  Bibasilar opacities, right greater than left; atelectasis, aspiration, or infiltrate.  Small right pleural effusion.  Biaxillary, mediastinal, right hilar lymphadenopathy. Recommend clinical correlation and consider tissue sampling of an axillary node.  4 mm nodule right upper lobe. Recommend attention with a 1-year follow-up.  Filling defect along the anterior superior wall of the trachea, favored to reflect mucus/secretions.  Attention on follow-up.   Original Report Authenticated By: Jearld Lesch, M.D.    Dg Chest Port 1  View  07/21/2012  *RADIOLOGY REPORT*  Clinical Data: Pleuritic chest pain.  PORTABLE CHEST - 1 VIEW  Comparison: 07/07/2011.  Findings: The heart is enlarged.  There is moderate vascular congestion and probable mild interstitial pulmonary edema.  No definite pleural effusions or pneumothorax.  IMPRESSION: Cardiac enlargement with vascular congestion and mild interstitial pulmonary edema.   Original Report Authenticated By: Rudie Meyer, M.D.    Medications: I have reviewed the patient's current medications. Scheduled Meds: . enoxaparin (LOVENOX) injection  40 mg Subcutaneous Q24H  . lisinopril  10 mg Oral Daily  . multivitamin with minerals  1 tablet Oral Daily  . sodium chloride  3 mL Intravenous Q12H  . sodium chloride  3 mL Intravenous Q12H   Continuous Infusions: . sodium chloride 125 mL/hr at 07/22/12 0922   PRN Meds:.sodium chloride, acetaminophen, acetaminophen, acetaminophen, acetaminophen-codeine, fentaNYL, hydrALAZINE, nitroGLYCERIN,  ondansetron (ZOFRAN) IV, ondansetron, sodium chloride  Assessment/Plan: Ms. Savary is a 37 year old female with past medical history of HTN, SVT, and strong family history of SVT, admitted for recurrent SVT, chest pain, and low Hgb.   1. SVT (supraventricular tachycardia), s/p ablation: Patient presented with sudden onset palpitations and diaphoresis with chest tightness. She had multiple similar episodes in the past with most recent ED visit on 07/09/12. Initial EKG in ED showed SVT, HR 160. She failed to convert with vagal maneuvers and carotid massage but did convert to sinus rhythm with 12mg  of adenosine. She has a significant family history of SVT with unknown etiology in mother and sisters. Home medications include Cardizem 120mg  qd and Lisinopril 10mg  qd, although compliance is questionable. Patient had stabbing chest pain last night, and PE was ruled out with CT chest. Two years ago, she had echocardiogram that showed mild to moderate LVH and negative  stress test. Dr. 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 Dr. Sharrell Ku discussed EP evaluation with patient. Patient had SVT ablation procedure on 4/23. Other possible contributing factors include anemia, thyroid, pregnancy (s/p tubal ligation), ACS, pulmonary embolus, and anxiety. Cardiac enzymes are negative x3. Lipid panel showed normal LDL (=36). TSH, free T4 normal. Pregnancy test is negative.  -Continue home Cardizem  -Continue home Lisinopril  -HgbA1c pending  -Nitroglycerin prn for chest pain  -f/u with Dr. Jacinto Halim in 2-4 weeks  2. Chest pain: Patient has had chest pain for about a month. She had chest pain that was partially relieved with pain medications prior to the ablation. She had an occurrence of stabbing 10/10 chest pain on 4/25, and PE, pericardial rupture, aortic rupture, and esophageal rupture were ruled out by CT chest. Likely costochondritis, as pain is focal and reproducible on palpation. -Start NSAID for 5-7 days for costochondritis  3. Anemia: Hgb low at 8.0 on admission, dropped to 6.9 next day following fluid repletion. MCV in low 80s. Iron is low (26), ferritin low (42), likely iron deficiency anemia. B12 and folate levels normal. Patient reported heavy menses up until January 2014. No obvious current source of bleeding (epistaxis, GI bleed). FOBT negative on 4/23. Given unknown etiology of anemia, will proceed with anemia workup. Transfused with 1 unit pRBC on 4/24, Hgb stable at 8.0 afterward. -d/c ASA for risk of bleed  -Start ferrous sulfate 325mg  -Workup for malignancy as outpatient  -f/u with Ob-Gyn for heavy menses   4. Hypokalemia, resolved: Patient was hypokalemic on admission, repleted.  5. Hypertension: BP was uncontrolled upon admission. Patient reports being non-adherent to medication for past month. BP remained elevated during hospitalization.  -Continue home Cardizem and Lisinopril  -f/u with PCP for BP control  6. Weight  loss, unintentional: Patient notes significant weight loss over last 1.5 years. She has significant family history of cancer- mother with breast and ovarian cancer, 1 sister is BRCA positive. Patient also complains of dysphagia with solids but not liquids.  -Consider axillary LN biopsy as outpatient -Mammogram has outpatient  -Colonoscopy as outpatient  -Encourage BRCA genetic testing as outpatient   7. Nausea and vomiting: Patient continues to feel nauseous. She has not vomited while in the hospital (she has been NPO or clear liquid diet).  -Zofran prn for nausea  -Consider GI workup as outpatient, consider EGD for esophageal strictures or webs (as pt has likely been chronically anemic)  8. F/E/N:  -NS 125 cc/hr  -Regular diet  8. DVT ppx: Lovenox  9. Dispo: Disposition is deferred at this time, awaiting SVT ablation and recovery. Anticipated discharge today.   The patient does have a current PCP Leilani Able, MD), therefore will not be requiring OCP follow-up after discharge.   The patient does not have transportation limitations that hinder transportation to clinic appointments.   LOS: 2 days   This is a Psychologist, occupational Note.  The care of the patient was discussed with Dr. Dorise Hiss and the assessment and plan formulated with their assistance.  Please see their attached note for official documentation of the daily encounter.  Cheryl Flash 07/22/2012, 11:00 AM

## 2012-07-22 NOTE — Progress Notes (Signed)
Patient's IV and telemetry has been discontinued, patient verbalizes understanding of discharge instruction and will be discharged home.  Lorretta Harp RN

## 2012-07-22 NOTE — Progress Notes (Signed)
Patient ID: [name], female   DOB: 1975/12/24, 37 y.o.   MRN: [id number] Subjective:  Still having chest pain. She can put her finger on the spot it hurts and reproduce it! Objective:  Vital Signs in the last 24 hours: Temp:  [97.4 F (36.3 C)-99.7 F (37.6 C)] 98 F (36.7 C) (04/25 0402) Pulse Rate:  [83-90] 85 (04/25 0402) Resp:  [14-16] 16 (04/25 0145) BP: (132-161)/(67-84) 149/82 mmHg (04/25 0402) SpO2:  [98 %-100 %] 100 % (04/25 0402) Weight:  [190 lb 12.8 oz (86.546 kg)] 190 lb 12.8 oz (86.546 kg) (04/25 0402)  Intake/Output from previous day: 04/24 0701 - 04/25 0700 In: 500 [P.O.:100; Blood:400] Out: 1225 [Urine:1225] Intake/Output from this shift:    Physical Exam: Well appearing NAD HEENT: Unremarkable Neck:  7 cm JVD, no thyromegally Back:  No CVA tenderness Lungs:  Clear with no wheezes, rales, or rhonchi HEART:  Regular rate rhythm, no murmurs, no rubs, no clicks Abd:  soft, positive bowel sounds, no organomegally, no rebound, no guarding Ext:  2 plus pulses, no edema, no cyanosis, no clubbing Skin:  No rashes no nodules Neuro:  CN II through XII intact, motor grossly intact  Lab Results:  Recent Labs  07/21/12 2109 07/22/12 0527  WBC 5.0 3.7*  HGB 8.6* 8.0*  PLT 151 149*    Recent Labs  07/20/12 1441 07/21/12 0615 07/21/12 2109  NA 136 138  --   K 3.2* 3.8  --   CL 100 108  --   CO2 24 25  --   GLUCOSE 116* 91  --   BUN 9 7  --   CREATININE 0.79 0.71 0.64    Recent Labs  07/21/12 0019 07/21/12 0615  TROPONINI <0.30 <0.30   Hepatic Function Panel  Recent Labs  07/21/12 0615  PROT 8.5*  ALBUMIN 2.3*  AST 127*  ALT 29  ALKPHOS 45  BILITOT 0.3    Recent Labs  07/21/12 0615  CHOL 88   No results found for this basename: PROTIME,  in the last 72 hours  Imaging: Ct Angio Chest Pe W/cm &/or Wo Cm  07/22/2012  *RADIOLOGY REPORT*  Clinical Data: Chest pain, shortness of breath  CT ANGIOGRAPHY CHEST  Technique:   Multidetector CT imaging of the chest using the standard protocol during bolus administration of intravenous contrast. Multiplanar reconstructed images including MIPs were obtained and reviewed to evaluate the vascular anatomy.  Contrast: 70mL OMNIPAQUE IOHEXOL 350 MG/ML SOLN the  Comparison: 07/21/2012 radiograph, 01/16/2010 CT  Findings: Suboptimal contrast bolus timing, with much of the contrast in the left upper extremity and SVC.  Allowing for this, no main branch pulmonary arterial filling defect.  Distal lobar and segmental/subsegmental branches are suboptimally opacified.  Cardiomegaly.  Small pericardial effusion.  Small right pleural effusion.  Axillary lymphadenopathy, measuring up to 1.7 cm on the right and 2.0 cm on the left.  Enlarged right hilar lymph node, measuring 1.4 cm and subcarinal adenopathy, difficult to separate from the esophagus.  Limited images through the upper abdomen show no acute finding.  6 mm filling defect along the right anterior wall of the superior trachea on image 7.  Central airways otherwise patent.  Bibasilar opacities, right greater than left.  No pneumothorax.  4 mm nodule right upper lobe (image 40).  No acute osseous finding.  IMPRESSION: Suboptimal contrast bolus timing.  No central pulmonary embolism. Distal lobar more peripheral branches are poorly opacified, limiting evaluation.  Cardiomegaly and small pericardial effusion.  Bibasilar opacities, right greater than left; atelectasis, aspiration, or infiltrate.  Small right pleural effusion.  Biaxillary, mediastinal, right hilar lymphadenopathy. Recommend clinical correlation and consider tissue sampling of an axillary node.  4 mm nodule right upper lobe. Recommend attention with a 1-year follow-up.  Filling defect along the anterior superior wall of the trachea, favored to reflect mucus/secretions.  Attention on follow-up.   Original Report Authenticated By: Jearld Lesch, M.D.    Dg Chest Port 1  View  07/21/2012  *RADIOLOGY REPORT*  Clinical Data: Pleuritic chest pain.  PORTABLE CHEST - 1 VIEW  Comparison: 07/07/2011.  Findings: The heart is enlarged.  There is moderate vascular congestion and probable mild interstitial pulmonary edema.  No definite pleural effusions or pneumothorax.  IMPRESSION: Cardiac enlargement with vascular congestion and mild interstitial pulmonary edema.   Original Report Authenticated By: Rudie Meyer, M.D.     Cardiac Studies: Tele - nsr Assessment/Plan:  1. Chest pain - note CT scan results. Her sister who also has SVT has costochondritis and palpation of the spot on the left chest reproduces her pain. Would start Toradol and ambulate. 2. SVT - she is s/p catheter ablation of AVNRT with no apparent complication. Note small pericardial effusion by CT scan. I did not appreciate any effusion on echo at bedside after ablation.  Rec: ok to ambulate, treat pain with NSAIDS and if improved she should be ok for discharge this p.m.  LOS: 2 days    Gregg Taylor,M.D. 07/22/2012, 8:15 AM

## 2012-07-22 NOTE — Op Note (Signed)
[name], [name] NO.:  [id number]  MEDICAL RECORD NO.:  [id number]  LOCATION:  [id number]                        FACILITY:  [facility]  PHYSICIAN:  Doylene Canning. 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  INTRODUCTION:  The patient is a 37 year old woman with a long history of tachy palpitations dating back many years.  She has had episodes of SVT despite medical therapy with calcium channel blockers.  The patient presented to the hospital with sustained SVT and was treated with adenosine.  She had chest pain associated with her SVT and ruled out for an MI.  She is now referred for electrophysiologic study and catheter ablation.  PROCEDURE IN DETAIL:  After informed consent was obtained, the patient was taken to the diagnostic EP lab in a fasting state.  After usual preparation and draping, a 6-French hexapolar catheter was inserted percutaneously in the right jugular vein and advanced to the coronary sinus.  A 6-French quadripolar catheter was inserted percutaneously in the right femoral vein and advanced to the right ventricle.  A 6-French quadripolar catheter was inserted percutaneously in the right femoral vein and advanced to the His bundle region.  After measurement of the basic intervals, rapid ventricular pacing was carried out from the right ventricle demonstrating VA dissociation at 600 milliseconds.  Programmed ventricular stimulation was carried out demonstrating VA dissociation at 600 milliseconds.  Programmed atrial stimulation was then carried out at a base drive cycle length of 161 milliseconds.  S1 and S2 interval stepwise decreased down to 310 milliseconds where the AV node ERP was observed.  During programmed atrial stimulation, there were  multiple agents jumps and echo beats, but initially no SVT.  Next, rapid atrial pacing was carried out from the right atrium and the coronary sinus at a base drive cycle length of 096 milliseconds and stepwise decreased down to 380 milliseconds where AV Wenckebach was observed.  Next, isoproterenol was infused at 1-3 mcg per minute.  Programmed atrial stimulation was carried out from the atrium at a base drive cycle length of 045 milliseconds.  The S1 and S2 interval stepwise decreased down to 280 milliseconds resulting in the initiation of SVT.  This was short RP tachycardia with midline atrial activation and a very short VA interval. During tachycardia, which had a cycle length of 410 milliseconds, PVCs placed at the time of His bundle refractoriness did not pre-excite the atrium and during ventricular pacing in tachycardia, there was a VA activation sequence.  All of the above, confirmed a diagnosis of AV node reentrant tachycardia.  At this point, a 7-French quadripolar ablation catheter was then maneuvered into the right femoral vein and advanced into the right atrium.  Mapping of Koch's triangle was carried out.  It was larger than usual, but fairly short.  A total of 5 RF energy applications were delivered to site 8-10 in Koch's triangle, and this demonstrated accelerated junctional rhythm.  Following catheter ablation, rapid atrial pacing, and programmed atrial stimulation  was again carried out from the atrium and there was no inducible SVT.  This was both with and without isoproterenol.  Programmed atrial stimulation was carried out demonstrating residual echo beats, but no double echo beats, and no SVT.  At this point, the patient was observed for approximately 20 minutes and had no recurrent SVT despite an aggressive pacing protocol.  The catheters were then removed.  Hemostasis was assured.  The patient was returned to her room in satisfactory condition.  COMPLICATIONS:   There were no immediate procedure complications.  RESULTS:  A.  Baseline ECG demonstrates sinus rhythm with normal axis and intervals.  There was no pre-excitation. B.  Baseline intervals, sinus node cycle length was 676 milliseconds and QRS duration was 85 milliseconds.  The QT interval is 240 millisecond. C.  The PR interval was 150 milliseconds. D.  Rapid ventricular pacing.  Rapid atrial pacing was carried out from the right ventricle demonstrating VA dissociation at baseline pacing interval of 600 milliseconds. E.  Deep programmed ventricular stimulation.  Programmed ventricular stimulation was carried out from the right ventricle at base drive cycle length of 161 milliseconds demonstrating VA dissociation. F.  Rapid atrial pacing.  Rapid atrial pacing was carried out from the atrium and coronary sinus at base drive cycle length of 096 milliseconds and stepwise decreased down to 380 milliseconds where AV Wenckebach was observed.  During rapid atrial pacing the PR interval was less than the RR interval.  Following catheter ablation. G.  Programmed atrial stimulation.  Programmed atrial stimulation was carried out from the atrium at a base drive cycle length of 045 and 500 milliseconds.  The S1-S2 interval stepwise decreased down atrial refractoriness.  At an S1-S2 coupling interval of 500/280, there was inducible SVT.  Following catheter ablation, there was no inducible SVT, though there was residual echo beats. H.  Arrhythmias observed. 1. AV node reentrant tachycardia initiation of programmed atrial     stimulation, decoration was sustained.  Cycle length was 410     milliseconds.  Method of termination with coronary sinus pacing.     a.     Mapping.  Mapping of AV nodal reentrant tachycardia      demonstrated a larger than usual Koch's triangle.         I. RF energy application.  A total of 5 RF energy applications             delivered to site 8 in Koch's triangle resulting  in             accelerated junctional rhythm.  Following RF energy             application, there was no inducible SVT.  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     Doylene Canning. Ladona Ridgel, MD     GWT/MEDQ  D:  07/21/2012  T:  07/22/2012  Job:  [id number]  cc:   Pamella Pert, MD

## 2012-07-22 NOTE — Progress Notes (Signed)
Resident Co-sign Daily Note: I have seen the patient and reviewed the daily progress note by Cheryl Flash MS 4 and discussed the care of the patient with them.  See below for documentation of my findings, assessment, and plans.  Subjective: The patient is feeling okay this morning but her chest is still aching. She states that it is not stabbing or burning. She was treated with some nitro overnight without relief. Pain medication gives temporary relief. She is not having fevers or chills. She is not having abdominal pain or nausea/vomiting/diarrhea. She is still having the feeling of a small stomach but has not vomited in house although she states she vomits daily at home.  Discussed with her the strong need for follow up with mammogram, lymph node follow up, colonoscopy, cardiology, OB/gyn for the excessive menorrhagia.   Objective: Vital signs in last 24 hours: Filed Vitals:   07/22/12 0145 07/22/12 0402 07/22/12 0927 07/22/12 1354  BP: 148/82 149/82 151/81 137/78  Pulse: 84 85 88 88  Temp: 98.3 F (36.8 C) 98 F (36.7 C) 98.2 F (36.8 C) 98.4 F (36.9 C)  TempSrc: Oral Oral Oral Oral  Resp: 16  16 18   Height:      Weight:  190 lb 12.8 oz (86.546 kg)    SpO2: 100% 100% 98% 98%   Physical Exam: General: resting in bed, lying in bed  HEENT: PERRL, EOMI, no scleral icterus  Cardiac: RRR, 1-2/6 systolic murmur over the right sternal border  Pulm: clear to auscultation bilaterally, moving normal volumes of air  Abd: soft, nontender, nondistended, BS present, obese  Ext: warm and well perfused, no pedal edema  Neuro: alert and oriented X3, cranial nerves II-XII grossly intact  Lab Results: Reviewed and documented in Electronic Record Micro Results: Reviewed and documented in Electronic Record Studies/Results: Reviewed and documented in Electronic Record Medications: I have reviewed the patient's current medications. Scheduled Meds: . enoxaparin (LOVENOX) injection  40 mg  Subcutaneous Q24H  . lisinopril  10 mg Oral Daily  . multivitamin with minerals  1 tablet Oral Daily  . sodium chloride  3 mL Intravenous Q12H  . sodium chloride  3 mL Intravenous Q12H   Continuous Infusions: . sodium chloride 125 mL/hr at 07/22/12 0922   PRN Meds:.sodium chloride, acetaminophen, acetaminophen, acetaminophen, acetaminophen-codeine, fentaNYL, hydrALAZINE, nitroGLYCERIN, ondansetron (ZOFRAN) IV, ondansetron, sodium chloride Assessment/Plan:  SVT (supraventricular tachycardia) - She underwent ablation yesterday which appears to be successful. She did convert to sinus rhthynn in the ED with adenosine 12 mg. She has been resumed on her home dose of diltiazem 120 mg daily. Strong familial history of SVT with mother and sister. 2 years ago she had Echo with LVH and stress test negative.  -s/p SVT ablation with Dr. Jacinto Halim and Dr. Graciela Husbands -Appreciate cardiology input whether patient needs diltiazem long term and will discharge home with diltiazem and at follow up can decide if long term medication. -Free T4 normal (TSH normal)  -HgbA1c was canceled for some reason and she should have screening for diabetes.   -Naproxen for chest pain 1 pill 500 mg BID for 7 days. -CTA negative for PE, Stress test negative 2 years ago and not likely to be ACS, should not be pain from post-ablation, not likely to be esophageal rupture given normal CT chest.  -Leading diagnosis is costochondritis.  Anemia - Hg 8 this morning after 1 unit PRBC and no signs of bleeding. No folate or B12 deficienct and likely to be secondary to excessive menorrhagia  but needs further out-patient evaluation. Not bleeding currently. Not on her period, FOBT negative, no nosebleeds. Will start her on iron pills daily and advised about the tendency to cause constipation and she may need stool softener.   Hypokalemia - Resolved.  Hypertension - Will monitor on home meds. She was not on home meds for > 1 month. Will discharge on  diltiazem 120 mg daily and lisinopril 10 mg daily.   Weight loss, unintentional -  Will likely need to have out-patient evaluation. She is down about 35 pounds in 2 years since last documented weight with Dr. Jacinto Halim. She will need GI workup with EGD and colonoscopy to rule out stricture or webb in esophagus causing decreased PO intake. She will also need to be offered BRCA testing and would be recommended mammogram given mother's diagnosis of breast Ca at age 99.   New lymphadenopathy will also need to be followed up and may need out-patient biopsy.  DVT ppx - Lovenox [place] daily  Disposition - The patient will be discharged home today with follow up with PCP in ~1 week and with her cardiologist in 2-4 weeks.    LOS: 2 days   Genella Mech 07/22/2012, 4:21 PM

## 2012-11-10 ENCOUNTER — Inpatient Hospital Stay (HOSPITAL_COMMUNITY)
Admission: EM | Admit: 2012-11-10 | Discharge: 2012-11-15 | DRG: 683 | Disposition: A | Discharge status: Home / Self Care | Payer: Medicaid Other | Attending: Internal Medicine | Admitting: Internal Medicine

## 2012-11-10 ENCOUNTER — Encounter (HOSPITAL_COMMUNITY): Payer: Self-pay | Admitting: Internal Medicine

## 2012-11-10 ENCOUNTER — Inpatient Hospital Stay (HOSPITAL_COMMUNITY): Payer: Medicaid Other

## 2012-11-10 DIAGNOSIS — L659 Nonscarring hair loss, unspecified: Secondary | ICD-10-CM | POA: Diagnosis present

## 2012-11-10 DIAGNOSIS — R112 Nausea with vomiting, unspecified: Secondary | ICD-10-CM

## 2012-11-10 DIAGNOSIS — R600 Localized edema: Secondary | ICD-10-CM

## 2012-11-10 DIAGNOSIS — N938 Other specified abnormal uterine and vaginal bleeding: Secondary | ICD-10-CM | POA: Diagnosis present

## 2012-11-10 DIAGNOSIS — R609 Edema, unspecified: Secondary | ICD-10-CM

## 2012-11-10 DIAGNOSIS — N179 Acute kidney failure, unspecified: Principal | ICD-10-CM

## 2012-11-10 DIAGNOSIS — E8809 Other disorders of plasma-protein metabolism, not elsewhere classified: Secondary | ICD-10-CM | POA: Diagnosis present

## 2012-11-10 DIAGNOSIS — M7989 Other specified soft tissue disorders: Secondary | ICD-10-CM

## 2012-11-10 DIAGNOSIS — N949 Unspecified condition associated with female genital organs and menstrual cycle: Secondary | ICD-10-CM | POA: Diagnosis present

## 2012-11-10 DIAGNOSIS — B370 Candidal stomatitis: Secondary | ICD-10-CM | POA: Diagnosis not present

## 2012-11-10 DIAGNOSIS — M329 Systemic lupus erythematosus, unspecified: Secondary | ICD-10-CM | POA: Diagnosis present

## 2012-11-10 DIAGNOSIS — R233 Spontaneous ecchymoses: Secondary | ICD-10-CM | POA: Diagnosis present

## 2012-11-10 DIAGNOSIS — D72819 Decreased white blood cell count, unspecified: Secondary | ICD-10-CM | POA: Diagnosis present

## 2012-11-10 DIAGNOSIS — Z79899 Other long term (current) drug therapy: Secondary | ICD-10-CM

## 2012-11-10 DIAGNOSIS — D649 Anemia, unspecified: Secondary | ICD-10-CM

## 2012-11-10 DIAGNOSIS — G4730 Sleep apnea, unspecified: Secondary | ICD-10-CM | POA: Diagnosis present

## 2012-11-10 DIAGNOSIS — E876 Hypokalemia: Secondary | ICD-10-CM

## 2012-11-10 DIAGNOSIS — I10 Essential (primary) hypertension: Secondary | ICD-10-CM | POA: Diagnosis present

## 2012-11-10 DIAGNOSIS — I471 Supraventricular tachycardia: Secondary | ICD-10-CM

## 2012-11-10 DIAGNOSIS — R634 Abnormal weight loss: Secondary | ICD-10-CM

## 2012-11-10 DIAGNOSIS — R21 Rash and other nonspecific skin eruption: Secondary | ICD-10-CM

## 2012-11-10 DIAGNOSIS — D259 Leiomyoma of uterus, unspecified: Secondary | ICD-10-CM | POA: Diagnosis present

## 2012-11-10 LAB — HEPATIC FUNCTION PANEL
ALT: 28 U/L (ref 0–35)
Bilirubin, Direct: <0.1 mg/dL (ref 0.0–0.3)
Total Protein: 8 g/dL (ref 6.0–8.3)

## 2012-11-10 LAB — CBC WITH DIFFERENTIAL/PLATELET
Basophils Relative: 1 % (ref 0–1)
Basophils Relative: 1 % (ref 0–1)
Eosinophils Absolute: 0 10*3/uL (ref 0.0–0.7)
Eosinophils Relative: 1 % (ref 0–5)
Hemoglobin: 6.8 g/dL — CL (ref 12.0–15.0)
Hemoglobin: 8.7 g/dL — ABNORMAL LOW (ref 12.0–15.0)
Lymphocytes Relative: 19 % (ref 12–46)
Lymphs Abs: 0.8 10*3/uL (ref 0.7–4.0)
Lymphs Abs: 0.6 10*3/uL — ABNORMAL LOW (ref 0.7–4.0)
MCH: 30.2 pg (ref 26.0–34.0)
MCHC: 33.3 g/dL (ref 30.0–36.0)
MCV: 90.7 fL (ref 78.0–100.0)
Monocytes Relative: 6 % (ref 3–12)
Monocytes Relative: 4 % (ref 3–12)
Neutro Abs: 2.5 10*3/uL (ref 1.7–7.7)
Neutrophils Relative %: 71 % (ref 43–77)
Neutrophils Relative %: 76 % (ref 43–77)
RBC: 2.25 MIL/uL — ABNORMAL LOW (ref 3.87–5.11)
RBC: 2.88 MIL/uL — ABNORMAL LOW (ref 3.87–5.11)
WBC: 3.3 10*3/uL — ABNORMAL LOW (ref 4.0–10.5)

## 2012-11-10 LAB — URINALYSIS, ROUTINE W REFLEX MICROSCOPIC
Bilirubin Urine: NEGATIVE
Leukocytes, UA: NEGATIVE
Nitrite: NEGATIVE
Specific Gravity, Urine: 1.011 (ref 1.005–1.030)
Urobilinogen, UA: 0.2 mg/dL (ref 0.0–1.0)
pH: 6 (ref 5.0–8.0)

## 2012-11-10 LAB — BASIC METABOLIC PANEL
BUN: 24 mg/dL — ABNORMAL HIGH (ref 6–23)
Calcium: 8.2 mg/dL — ABNORMAL LOW (ref 8.4–10.5)
Creatinine, Ser: 2.52 mg/dL — ABNORMAL HIGH (ref 0.50–1.10)
GFR calc Af Amer: 27 mL/min — ABNORMAL LOW (ref >90)
GFR calc non Af Amer: 23 mL/min — ABNORMAL LOW (ref >90)
Glucose, Bld: 90 mg/dL (ref 70–99)

## 2012-11-10 LAB — PROTIME-INR
INR: 0.95 (ref 0.00–1.49)
Prothrombin Time: 12.5 seconds (ref 11.6–15.2)

## 2012-11-10 LAB — COMPREHENSIVE METABOLIC PANEL
CO2: 20 mEq/L (ref 19–32)
Calcium: 8.4 mg/dL (ref 8.4–10.5)
Creatinine, Ser: 2.1 mg/dL — ABNORMAL HIGH (ref 0.50–1.10)
GFR calc Af Amer: 34 mL/min — ABNORMAL LOW (ref >90)
GFR calc non Af Amer: 29 mL/min — ABNORMAL LOW (ref >90)
Glucose, Bld: 80 mg/dL (ref 70–99)
Total Protein: 7.6 g/dL (ref 6.0–8.3)

## 2012-11-10 LAB — URINE MICROSCOPIC-ADD ON

## 2012-11-10 LAB — CREATININE, URINE, RANDOM: Creatinine, Urine: 73.69 mg/dL

## 2012-11-10 MED ORDER — ACETAMINOPHEN 650 MG RE SUPP: 650.0000 mg | Freq: Four times a day (QID) | RECTAL | Status: DC | PRN

## 2012-11-10 MED ORDER — SODIUM CHLORIDE 0.9 % IJ SOLN
3.0000 mL | Freq: Two times a day (BID) | INTRAMUSCULAR | Status: DC
  Administered 2012-11-10 – 2012-11-14 (×4): 3 mL via INTRAVENOUS

## 2012-11-10 MED ORDER — DILTIAZEM HCL 60 MG PO TABS: 120.0000 mg | ORAL_TABLET | Freq: Every day | ORAL | Status: DC

## 2012-11-10 MED ORDER — METOPROLOL TARTRATE 25 MG PO TABS
25.0000 mg | ORAL_TABLET | Freq: Two times a day (BID) | ORAL | Status: DC
  Administered 2012-11-10 – 2012-11-15 (×11): 25 mg via ORAL
  Filled 2012-11-10 (×12): qty 1

## 2012-11-10 MED ORDER — ACETAMINOPHEN 325 MG PO TABS: 650.0000 mg | ORAL_TABLET | Freq: Four times a day (QID) | ORAL | Status: DC | PRN

## 2012-11-10 MED ORDER — DILTIAZEM HCL ER COATED BEADS 120 MG PO CP24
120.0000 mg | ORAL_CAPSULE | Freq: Every day | ORAL | Status: DC
  Filled 2012-11-10: qty 1

## 2012-11-10 MED ORDER — SODIUM CHLORIDE 0.9 % IV SOLN: Freq: Once | INTRAVENOUS | Status: DC

## 2012-11-10 MED ORDER — ONDANSETRON HCL 4 MG PO TABS: 4.0000 mg | ORAL_TABLET | Freq: Four times a day (QID) | ORAL | Status: DC | PRN

## 2012-11-10 MED ORDER — ONDANSETRON HCL 4 MG/2ML IJ SOLN: 4.0000 mg | Freq: Four times a day (QID) | INTRAMUSCULAR | Status: DC | PRN

## 2012-11-10 MED ORDER — PREDNISONE 50 MG PO TABS
60.0000 mg | ORAL_TABLET | Freq: Every day | ORAL | Status: DC
  Administered 2012-11-10 – 2012-11-15 (×5): 60 mg via ORAL
  Filled 2012-11-10 (×6): qty 1

## 2012-11-10 MED ORDER — SODIUM CHLORIDE 0.9 % IJ SOLN
3.0000 mL | Freq: Two times a day (BID) | INTRAMUSCULAR | Status: DC
  Administered 2012-11-10 – 2012-11-15 (×8): 3 mL via INTRAVENOUS

## 2012-11-10 MED ORDER — SODIUM CHLORIDE 0.9 % IV BOLUS (SEPSIS)
1000.0000 mL | Freq: Once | INTRAVENOUS | Status: AC
  Administered 2012-11-10: 1000 mL via INTRAVENOUS

## 2012-11-10 NOTE — Progress Notes (Signed)
Subjective: Patient seen and examined, admitted with rash and lower extremity swelling. UA shows RBc 21-50/hpf, proteinuria. Filed Vitals:   11/10/12 1045  BP: 140/88  Pulse:   Temp: 98.5 F (36.9 C)  Resp: 18    Chest: Clear Bilaterally Heart : S1S2 RRR Abdomen: Soft, nontender Ext : Bilateral trace edema, papular erythematous rash noted on both the legs Neuro: Alert, oriented x 3  A/P Rash- ? Autoimmune , the work up has been ordered, will follow the results. AKI- She has abnormal UA, will get nephrology consult. May need kidney biopsy. Hypertension- Has been taking cardizem which can also cause rash and edema , will d/c cardizem and start metoprolol 25 mg po bid. Called and discussed with her cardiologist Dr Jacinto Halim. H/O SVT- s/p ablation, no new episodes. Anemia- has h/o DUB, also could be due to renal cause, will continue with blood transfusion and check cbc in am. DVT Prophylaxis- SCD    Meredeth Ide [facility] [phone]

## 2012-11-10 NOTE — Consult Note (Signed)
[facility] CONSULT NOTE    Date: 11/10/2012                  Patient Name:  [name]  MRN: [id number]  DOB: Aug 07, 1975  Age / Sex: 36/F 37 y.o., female         PCP: Karie Chimera, MD                 Service Requesting Consult: [facility]                 Reason for Consult: AKI, hematuria            History of Present Illness: [name] is a 37 y.o.African American female with a PMHx of HTN, SVT s/p ablation, chronic anemia with heavy menses, and anxiety, who was admitted to [facility] on 11/10/2012 for evaluation of symptomatic anemia, lower extremity edema, and rash.  She was also found to have acute renal failure on admission with Cr 2.10 along with hypalbuminemia, and u/a showing hematuria and proteinuria.  Her Hb on admission was 6.8 and she was transfused 2 units PRBCs with improvement up to 8.7.    She endorses frequent episodes of lower extremity edema especially when she has been on her feet a lot and subsequent development of occasionally pruritic red rash of b/l lower extremities up to her knees that spontaneously resolves over a few days.  This time however, she said the rash has not resolved, started mainly yesterday, and extends above the level of her knees.  These symptoms have been present for several months.  She also endorses continued un-intentional weight loss, fever, chills, night sweats, loss of hair and eyebrows, occasional joint pain, syncope yesterday, and heavy menses.  She has a strong family history of breast and ovarian cancer--present in her mother and one of her sisters is positive for BRCA gene.    We were asked to consult for further evaluation of AKI and u/a findings.  Medications: Outpatient medications: Prescriptions prior to admission  Medication Sig Dispense Refill  . diltiazem (CARDIZEM LA) 120 MG 24 hr tablet Take 120 mg by mouth daily.      Marland Kitchen ibuprofen (ADVIL,MOTRIN) 200 MG tablet Take 400 mg by mouth every 6 (six) hours as  needed for pain.       Current medications: Current Facility-Administered Medications  Medication Dose Route Frequency Provider Last Rate Last Dose  . acetaminophen (TYLENOL) tablet 650 mg  650 mg Oral Q6H PRN Eduard Clos, MD       Or  . acetaminophen (TYLENOL) suppository 650 mg  650 mg Rectal Q6H PRN Eduard Clos, MD      . metoprolol tartrate (LOPRESSOR) tablet 25 mg  25 mg Oral BID Meredeth Ide, MD   25 mg at 11/10/12 1055  . ondansetron (ZOFRAN) tablet 4 mg  4 mg Oral Q6H PRN Eduard Clos, MD       Or  . ondansetron [facility]) injection 4 mg  4 mg Intravenous Q6H PRN Eduard Clos, MD      . predniSONE (DELTASONE) tablet 60 mg  60 mg Oral Q breakfast Darden Palmer, MD      . sodium chloride 0.9 % injection 3 mL  3 mL Intravenous Q12H Eduard Clos, MD      . sodium chloride 0.9 % injection 3 mL  3 mL Intravenous Q12H Eduard Clos, MD   3 mL at 11/10/12 1056    Allergies: Allergies  Allergen Reactions  . Other     Red peppers - swelling    Past Medical History: Past Medical History  Diagnosis Date  . Hypertension   . Cardiac arrhythmia   . SVT (supraventricular tachycardia)     "today, last week, 2 wk ago; maybe 1 month ago, etc; started w/in last 3-4 yrs"(07/20/2012)  . Fainting     "~ 1 month ago; probably related to SVT" (07/20/2012)  . Heart murmur     "small" (07/20/2012)  . Shortness of breath     "related to SVT episodes" (07/20/2012)  . Anemia     "today" (07/20/2012)  . Chest pain at rest     "related to SVT" (07/20/2012)  . Sleep apnea     "don't wear mask" (07/20/2012)  . Daily headache   . Anxiety    Past Surgical History: Past Surgical History  Procedure Laterality Date  . Appendectomy  2011  . Tubal ligation  2010  . Cesarean section  2001; 2010  . Cervical biopsy  2013  . Ablation      07/21/12 Dr. Lewayne Bunting    Family History: Family History  Problem Relation Age of Onset  . Cancer Mother     breast and  ovarian  . BRCA 1/2 Sister    Social History: History   Social History  . Marital Status: Single    Spouse Name: N/A    Number of Children: N/A  . Years of Education: N/A   Occupational History  . Not on file.   Social History Main Topics  . Smoking status: Never Smoker   . Smokeless tobacco: Never Used  . Alcohol Use: No     Comment: 07/20/2012 "might have a beer once/yr"  . Drug Use: No  . Sexual Activity: Not Currently   Other Topics Concern  . Not on file   Social History Narrative  . No narrative on file   Review of Systems:  Constitutional:  Fever, chills, night sweats, gets full easily, fatigue, and un-intentional weight loss, hair loss, loss of eyebrows.  HEENT:  Denies mouth sores  Respiratory:  Denies SOB, DOE, cough, and wheezing.   Cardiovascular:  Leg swelling and occasional palpitations   Gastrointestinal:  Denies nausea, vomiting, abdominal pain, diarrhea, constipation, blood in stool and abdominal distention.   Genitourinary:  Hematuria on u/a but denies gross hematuria.  Denies dysuria, urgency, frequency,flank pain and difficulty urinating.   Musculoskeletal:  Joint pain, lower extremity swelling and rash.   Skin:  Lower extremity non-blanchable red petechial rash.   Neurological:  Occasional dizziness, weakness, and syncope, headaches, and numbness of toes.    Vital Signs: Blood pressure 140/88, pulse 81, temperature 98.5 F (36.9 C), temperature source Oral, resp. rate 18, height 5\' 3"  (1.6 m), weight 173 lb (78.472 kg), last menstrual period 11/03/2012, SpO2 98.00%.  Weight trends: Filed Weights   11/10/12 0050 11/10/12 0636  Weight: 175 lb (79.379 kg) 173 lb (78.472 kg)   Physical Exam: Vitals reviewed. General: resting in bed, tired, NAD HEENT: PERRL, EOMI Cardiac: RRR, SEM Pulm: clear to auscultation bilaterally, no wheezes, rales, or rhonchi Abd: soft, obese, nontender, nondistended, BS present Ext: warm and well perfused, b/l lower  extremity edema, +2dp b/l, tenderness to palpation of some toes on right foot. Scab on dorsal surface of right foot non-draining Neuro: alert and oriented X3, cranial nerves II-XII grossly intact, strength and sensation to light touch equal in bilateral upper and lower extremities Skin: non-blanchable red  petechial rash on b/l lower extremities and plantar surface of feet including toes and extending up to knees with few red spots on b/l thighs  Lab results: Basic Metabolic Panel:  Recent Labs Lab 11/10/12 0301 11/10/12 1140  NA 132* 135  K 4.4 4.2  CL 102 106  CO2 22 20  GLUCOSE 90 80  BUN 24* 21  CREATININE 2.52* 2.10*  CALCIUM 8.2* 8.4   Liver Function Tests:  Recent Labs Lab 11/10/12 0301 11/10/12 1140  AST 105* 85*  ALT 28 20  ALKPHOS 55 54  BILITOT 0.3 0.3  PROT 8.0 7.6  ALBUMIN 2.0* 1.8*   CBC:  Recent Labs Lab 11/10/12 0301 11/10/12 1140  WBC 3.6* 3.3*  NEUTROABS 2.6 2.5  HGB 6.8* 8.7*  HCT 20.4* 25.4*  MCV 90.7 88.2  PLT 196 173   Microbiology: Results for orders placed during the hospital encounter of 03/06/10  CULTURE, ROUTINE-ABSCESS     Status: None   Collection Time    09/08/09 10:15 AM      Result Value Range Status   Specimen Description ABSCESS ABDOMEN   Final   Special Requests NONE   Final   Gram Stain     Final   Value: FEW WBC PRESENT,BOTH PMN AND MONONUCLEAR     NO ORGANISMS SEEN     Gram Stain Report Called to,Read Back By and Verified With: Gram Stain Report Called to,Read Back By and Verified With: T Lorin Picket RN [phone] SCALES H Performed at [facility]   Culture RARE ESCHERICHIA COLI   Final   Report Status 09/12/2009 FINAL   Final   Organism ID, Bacteria ESCHERICHIA COLI   Final  GRAM STAIN     Status: None   Collection Time    09/08/09 10:15 AM      Result Value Range Status   Specimen Description ABSCESS ABDOMEN   Final   Special Requests NONE   Final   Gram Stain     Final   Value: FEW WBC PRESENT,BOTH PMN AND  MONONUCLEAR     NO ORGANISMS SEEN     Gram Stain Report Called to,Read Back By and Verified With: T SCOTT,RN 1130 09/08/09 SCALES H   Report Status 09/08/2009 FINAL   Final   Coagulation Studies:  Recent Labs  11/10/12 0409  LABPROT 12.5  INR 0.95   Urinalysis:  Recent Labs  11/10/12 0536  COLORURINE YELLOW  LABSPEC 1.011  PHURINE 6.0  GLUCOSEU NEGATIVE  HGBUR LARGE*  BILIRUBINUR NEGATIVE  KETONESUR NEGATIVE  PROTEINUR >300*  UROBILINOGEN 0.2  NITRITE NEGATIVE  LEUKOCYTESUR NEGATIVE    Imaging: US Renal  11/10/2012   *RADIOLOGY REPORT*  Clinical Data: Acute renal failure, hypertension  RENAL/URINARY TRACT ULTRASOUND COMPLETE  Comparison:  CT 09/08/2009  Findings:  Right Kidney:  12.6 cm in length.  Parenchyma is slightly echogenic compared to the adjacent liver.  No focal lesion or hydronephrosis. The  Left Kidney:  12.9 cm. No hydronephrosis.  Well-preserved cortex. Normal size and parenchymal echotexture without focal abnormalities.  Bladder:  Incompletely distended, unremarkable with bilateral ureteral jets documented.  Incidental note made of spleen enlarged without focal lesion, 6 x 10.9 x 11.3 cm ( 389.9 cc).  IMPRESSION:  1.  No hydronephrosis. 2.  Echogenic renal parenchyma, a nonspecific indicator of medical renal disease. 3.  Splenomegaly.   Original Report Authenticated By: D. Andria Rhein, MD   Dg Chest Port 1 View  11/10/2012   *RADIOLOGY REPORT*  Clinical  Data: Rule out congestion  PORTABLE CHEST - 1 VIEW  Comparison: Chest x-ray and CT PE study 07/21/2012  Findings: Single frontal view of the chest demonstrates stable enlargement of the cardiopericardial silhouette.  There is mild pulmonary vascular congestion but no overt edema.  Overall, the degree of vascular congestion is less than seen in April 2014.  No focal airspace consolidation, pleural effusion or pneumothorax.  No acute osseous abnormality.  IMPRESSION:  1.  Mild pulmonary vascular congestion without overt  edema. Improved compared to 07/21/2012.  2.  Stable enlargement of the cardiopericardial silhouette likely reflecting a combination of cardiomegaly and pericardial fluid.   Original Report Authenticated By: Malachy Moan, M.D.     Assessment & Plan: [name] is a 37 y.o.African American female with a PMHx of HTN, SVT s/p ablation, chronic anemia with heavy menses, and anxiety, who was admitted to [facility] on 11/10/2012 for evaluation of symptomatic anemia, lower extremity edema and rash.  Found to have AKI with hematuria, proteinuria, and hypalbuminemia.   AKI--new onset along with microhematuria and proteinuria >300 and hypoalbuminemia with albumin down to 1.8 and hx of microscopic hematuria per prior u/a's.  Cr on admission 2.52 with elevated sed rate >140.   Nephrotic syndrome including in differential with possible ?membranous glomerulonephritis. Given rash and other symptoms including joint pain, weight loss, hair and eyebrow loss consideration also given to possible SLE vs. Vasculitis as well including ?HSP.  Malignancy is also included in her differential given significant un-intentional weight loss, fever, chills, night-sweats, heavy menses, and CT angio in April 2014 showing multiple biaxiallary, mediastinal, and right hilar lymphadenopathy along with 4mm RUL nodule.  She also has significant family history of breast and ovarian cancer, and sister testing positive for BRCA gene. Renal ultrasound: no hydronephrosis, echogenic renal parenchyma, and splenomegaly.  -renal biopsy tomorrow -serology pending: ANA, also include ANCA panel and complement -start prednisone 60mg  qd for now -trend renal function and replace electrolytes as needed  B/L lower extremity rash--non-blanchable red purpuric petechial rash, please see above discussion including differentials ?SLE, vasculitis, infectious/insect bite, medications, and HSP. Associated with anemia and AKI as well.  Platelets 173 and have been low in the  past down to 140s.    Anemia--Hb on admission 6.8, s/p 2 units PRBC and Hb improved to 8.7.  Hx of heavy menses.  LMP last week.  Microscopic hematuria on u/a.    Diet: regular and NPO after midnight DVT PPX - SCDs Dispo--pending further clinical improvement  Case discussed and patient seen with Dr. Lowell Guitar  Signed: Darden Palmer, MD PGY-2, Internal Medicine Resident Pager: [phone]  11/10/2012,7:09 PM

## 2012-11-10 NOTE — ED Notes (Signed)
Present with bilateral pedal edema that began at 7 pm with red papular rash associated with itching and pain.  +2 pedal pulse. Denies SOB.

## 2012-11-10 NOTE — Consult Note (Addendum)
37 year old female with several month history of edema of lower extremities, hypoalbuminemia, night sweats and fever, weight loss, anemia with heavy menses, alopecia, eyebrow hair loss, arthralgias involving her elbows, leukopenia bilateral, s/p RFA for SVT in April followed by petechial rash of lower extremities, denies photo-sensitivity of skin, or oral ulcers or family history of SLE.  Presented to ER with le rash and swelling and new onset AKI and microhematuria & proteinuria.  Of note pt has hx of B axillary, mediastinal and r hilar lymphadenopathy and 4mm rul nodule by CT  Exam remarkable for lack of eye brows, alopecia, petechial rash of le bilat from distal thigh to feet/toes with tender toes.  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 Lowell Guitar, MD Nephrology

## 2012-11-10 NOTE — Progress Notes (Signed)
VASCULAR LAB PRELIMINARY  PRELIMINARY  PRELIMINARY  PRELIMINARY  Bilateral lower extremity venous Dopplers completed.    Preliminary report:  There is no DVT or SVT noted in the bilateral lower extremities.  Bilateral enlarged inguinal lymph nodes.  Gitty Osterlund, RVT 11/10/2012, 4:06 PM

## 2012-11-10 NOTE — H&P (Addendum)
[facility] History and Physical  Deliana Avalos ZOX:[id number] DOB: [birth year] DOA: 11/10/2012  Referring physician: ER physician. PCP: Karie Chimera, MD  Specialists: Dr. Jacinto Halim. Cardiologist.  Chief Complaint: Lower extremity edema and skin rash.  HPI: [name] is a 37 y.o. female with history of SVT status post ablation, chronic anemia and hypertension presented to the ER because of lower extremity edema and rash in the lower extremities. Patient states that her lower extremity edema started off yesterday and along with it the rash appeared in the lower extremities. Patient states that she had similar incidents last month and month before when edema and rash appears and is also a few days. She often on get subjective feeling of fever and chills. Denies any use of any new medications. Last week she fell that she may have had an insect bite but not sure. Patient denies any chest pain or shortness of breath nausea vomiting abdominal pain diarrhea. In the ER patient's labs show anemia with increased creatinine for baseline. Patient has lower extremity petechial rashes which are nonblanching. The rash does extend from the feet to the thighs of both lower extremities. Patient does not have any obvious joint swellings but does have lower extremity edema which is nontender. Patient has been using ibuprofen very often for last few months because of joint pains and muscle aches. Patient has been admitted for further management.  Review of Systems: As presented in the history of presenting illness, rest negative.  Past Medical History  Diagnosis Date  . Hypertension   . Cardiac arrhythmia   . SVT (supraventricular tachycardia)     "today, last week, 2 wk ago; maybe 1 month ago, etc; started w/in last 3-4 yrs"(07/20/2012)  . Fainting     "~ 1 month ago; probably related to SVT" (07/20/2012)  . Heart murmur     "small" (07/20/2012)  . Shortness of breath     "related to SVT episodes"  (07/20/2012)  . Anemia     "today" (07/20/2012)  . Chest pain at rest     "related to SVT" (07/20/2012)  . Sleep apnea     "don't wear mask" (07/20/2012)  . Daily headache   . Anxiety    Past Surgical History  Procedure Laterality Date  . Appendectomy  2011  . Tubal ligation  2010  . Cesarean section  2001; 2010  . Cervical biopsy  2013  . Ablation      07/21/12 Dr. Lewayne Bunting    Social History:  reports that she has never smoked. She has never used smokeless tobacco. She reports that she does not drink alcohol or use illicit drugs. Home. where does patient live-- Can do ADLs. Can patient participate in ADLs?  Allergies  Allergen Reactions  . Other     Red peppers - swelling    History reviewed. No pertinent family history.    Prior to Admission medications   Medication Sig Start Date End Date Taking? Authorizing Provider  diltiazem (CARDIZEM) 120 MG tablet Take 1 tablet (120 mg total) by mouth daily. 03/14/12  Yes Adlih Moreno-Coll, MD  ibuprofen (ADVIL,MOTRIN) 200 MG tablet Take 400 mg by mouth every 6 (six) hours as needed for pain.   Yes Historical Provider, MD   Physical Exam: Filed Vitals:   11/10/12 0513 11/10/12 0515 11/10/12 0530 11/10/12 0545  BP: 163/68 149/83 160/103 149/85  Pulse: 78  91 84  Temp: 97.6 F (36.4 C)  97.8 F (36.6 C) 98.1 F (36.7 C)  TempSrc:  Oral  Oral Oral  Resp: 16  16 16   Weight:      SpO2:         General:  Well-developed and nourished.  Eyes: Anicteric pallor positive.  ENT: No discharge from the ears eyes nose mouth.  Neck: No mass felt.  Cardiovascular: S1-S2 heard.  Respiratory: No rhonchi or crepitations.  Abdomen: Soft nontender bowel sounds present.  Skin: Petechial rashes nonblanching and nontender involving the lower extremities bilaterally extending up to the thighs.  Musculoskeletal: Mild edema lower extremities.  Psychiatric: Appears normal.  Neurologic: Alert and very oriented to time place and person.  Moves all extremities.  Labs on Admission:  Basic Metabolic Panel:  Recent Labs Lab 11/10/12 0301  NA 132*  K 4.4  CL 102  CO2 22  GLUCOSE 90  BUN 24*  CREATININE 2.52*  CALCIUM 8.2*   Liver Function Tests: No results found for this basename: AST, ALT, ALKPHOS, BILITOT, PROT, ALBUMIN,  in the last 168 hours No results found for this basename: LIPASE, AMYLASE,  in the last 168 hours No results found for this basename: AMMONIA,  in the last 168 hours CBC:  Recent Labs Lab 11/10/12 0301  WBC 3.6*  NEUTROABS 2.6  HGB 6.8*  HCT 20.4*  MCV 90.7  PLT 196   Cardiac Enzymes: No results found for this basename: CKTOTAL, CKMB, CKMBINDEX, TROPONINI,  in the last 168 hours  BNP (last 3 results)  Recent Labs  07/22/12 0527  PROBNP 521.2*   CBG: No results found for this basename: GLUCAP,  in the last 168 hours  Radiological Exams on Admission: No results found.   Assessment/Plan Principal Problem:   Acute renal failure Active Problems:   Anemia   Hypertension   Skin rash   Lower extremity edema   1. Acute renal failure nonoliguric - check urine sodium and creatinine to calculate FeNA. Check UA. Patient has been using ibuprofen which has to be discontinued. Check renal sonogram. Transfusion of PRBC. Patient does have rash but platelet counts are normal at this time does not have any fever. Closely follow intake output and metabolic panel. 2. Skin rashes lower extremity edema - cause is not clear. Patient does not have thrombocytopenia though the rash looks petechial. Sedimentation rate, ANA, LDH, RMSF titer, HIV, LFT and blood cultures has been ordered. Check 2-D echo and chest x-ray. Dopplers of the lower extremities. Further recommendations accordingly based on assist results. Patient is presently afebrile and have not started any antibiotics. 3. Anemia and leukopenia - patient's anemia was attributed to it deficiency secondary to severe menstrual cycle in the  previous admissions. At this time patient is receiving 2 units of packed red blood cell transfusion. Closely follow CBC with differential after transfusion. 4. History of hypertension - presently on Cardizem. Patient is also placed on when necessary IV hydralazine for systolic blood pressure more than 160. 5. History of SVT status post ablation - presently asymptomatic.    Code Status: Full code.  Family Communication: Patient's mother at the bedside.  Disposition Plan: Admit to inpatient.    Georgana Romain N. [facility] Pager [phone].  If 7PM-7AM, please contact night-coverage [email or web address] Password [facility] 11/10/2012, 6:02 AM

## 2012-11-10 NOTE — ED Notes (Signed)
Lab reports critical lab value for hemoglobin Norlene Campbell MD informed

## 2012-11-10 NOTE — ED Provider Notes (Signed)
CSN: [id number]     Arrival date & time 11/10/12  0044 History     First MD Initiated Contact with Patient 11/10/12 [phone]     Chief Complaint  Patient presents with  . Leg Swelling   (Consider location/radiation/quality/duration/timing/severity/associated sxs/prior Treatment) HPI 37 yo female presents to the ER from home with complaint of bilateral lower extremity edema and rash.  She reports this has happened 2-3 times over the last 2-3 months.  Sxs usually resolve on their own after a day or so.  She was encouraged to come this time by a friend.  Pt with h/o SVT s/p ablation in April of this year.  She has h/o anemia requiring transfusions thought to be due to heavy menses.  She reports she has been having fevers to 100.9 at night, night sweats, and weight loss of greater than 25 lb over the last 4 months.  Pt was seen in f/u by her cardiologist on Monday.  She reports syncopal episode on Monday while shopping.  Pt has insect bite to right foot that has ulcerated.  Rash noted to be up to her thighs, she reports this is new and different than her prior episodes.  No n/v/d, abd pain, travel, new medications.  No known tick bites.   Past Medical History  Diagnosis Date  . Hypertension   . Cardiac arrhythmia   . SVT (supraventricular tachycardia)     "today, last week, 2 wk ago; maybe 1 month ago, etc; started w/in last 3-4 yrs"(07/20/2012)  . Fainting     "~ 1 month ago; probably related to SVT" (07/20/2012)  . Heart murmur     "small" (07/20/2012)  . Shortness of breath     "related to SVT episodes" (07/20/2012)  . Anemia     "today" (07/20/2012)  . Chest pain at rest     "related to SVT" (07/20/2012)  . Sleep apnea     "don't wear mask" (07/20/2012)  . Daily headache   . Anxiety    Past Surgical History  Procedure Laterality Date  . Appendectomy  2011  . Tubal ligation  2010  . Cesarean section  2001; 2010  . Cervical biopsy  2013  . Ablation      07/21/12 Dr. Lewayne Bunting     History reviewed. No pertinent family history. History  Substance Use Topics  . Smoking status: Never Smoker   . Smokeless tobacco: Never Used  . Alcohol Use: No     Comment: 07/20/2012 "might have a beer once/yr"   OB History   Grav Para Term Preterm Abortions TAB SAB Ect Mult Living                 Review of Systems  Constitutional: Positive for fever, chills, diaphoresis, activity change, fatigue and unexpected weight change.  HENT: Negative.   Eyes: Negative.   Respiratory: Negative.   Cardiovascular: Positive for leg swelling.  Gastrointestinal: Negative.   Endocrine: Positive for cold intolerance.  Genitourinary: Negative.   Musculoskeletal: Positive for arthralgias.  Skin: Positive for rash.  Allergic/Immunologic: Negative.   Neurological: Positive for dizziness and syncope.  Hematological: Bruises/bleeds easily.  Psychiatric/Behavioral: Negative.   All other systems reviewed and are negative.    Allergies  Other  Home Medications  No current outpatient prescriptions on file. BP 149/85  Pulse 84  Temp(Src) 98.1 F (36.7 C) (Oral)  Resp 16  Wt 175 lb (79.379 kg)  BMI 31.01 kg/m2  SpO2 100% Physical Exam  Nursing note  and vitals reviewed. Constitutional: She is oriented to person, place, and time. She appears well-developed and well-nourished. No distress.  HENT:  Head: Normocephalic and atraumatic.  Right Ear: External ear normal.  Left Ear: External ear normal.  Nose: Nose normal.  Mouth/Throat: Oropharynx is clear and moist. No oropharyngeal exudate.  Eyes: Conjunctivae and EOM are normal. Pupils are equal, round, and reactive to light.  Neck: Normal range of motion. Neck supple. No JVD present. No tracheal deviation present. No thyromegaly present.  Cardiovascular: Normal rate, regular rhythm, normal heart sounds and intact distal pulses.  Exam reveals no gallop and no friction rub.   No murmur heard. Pulmonary/Chest: Effort normal and breath  sounds normal. No stridor. No respiratory distress. She has no wheezes. She has no rales. She exhibits no tenderness.  Abdominal: Soft. Bowel sounds are normal. She exhibits no distension and no mass. There is no tenderness. There is no rebound and no guarding.  Musculoskeletal: Normal range of motion. She exhibits edema (edema to feet 2+, trace in lower extremities) and tenderness (ttp over lower extremites).  Lymphadenopathy:    She has no cervical adenopathy.  Neurological: She is alert and oriented to person, place, and time. She exhibits normal muscle tone. Coordination normal.  Skin: Rash (petechial nonblanching raised rash to bilateral lower extremities concentrated around the ankles but extending to upper inner thigh) noted.  Ulceration to top of right foot  Bruising to tops of feet    ED Course   Procedures (including critical care time)  Labs Reviewed  CBC WITH DIFFERENTIAL - Abnormal; Notable for the following:    WBC 3.6 (*)    RBC 2.25 (*)    Hemoglobin 6.8 (*)    HCT 20.4 (*)    RDW 17.0 (*)    All other components within normal limits  BASIC METABOLIC PANEL - Abnormal; Notable for the following:    Sodium 132 (*)    BUN 24 (*)    Creatinine, Ser 2.52 (*)    Calcium 8.2 (*)    GFR calc non Af Amer 23 (*)    GFR calc Af Amer 27 (*)    All other components within normal limits  SEDIMENTATION RATE - Abnormal; Notable for the following:    Sed Rate >140 (*)    All other components within normal limits  URINALYSIS, ROUTINE W REFLEX MICROSCOPIC - Abnormal; Notable for the following:    APPearance CLOUDY (*)    Hgb urine dipstick LARGE (*)    Protein, ur >300 (*)    All other components within normal limits  URINE MICROSCOPIC-ADD ON - Abnormal; Notable for the following:    Squamous Epithelial / LPF FEW (*)    Bacteria, UA FEW (*)    Casts HYALINE CASTS (*)    All other components within normal limits  CULTURE, BLOOD (ROUTINE X 2)  CULTURE, BLOOD (ROUTINE X 2)   URINE CULTURE  PROTIME-INR  PREGNANCY, URINE  TSH  HEPATIC FUNCTION PANEL  SODIUM, URINE, RANDOM  CREATININE, URINE, RANDOM  ANA  LACTATE DEHYDROGENASE  ROCKY MTN SPOTTED FVR AB, IGM-BLOOD  URINE RAPID DRUG SCREEN (HOSP PERFORMED)  COMPREHENSIVE METABOLIC PANEL  CBC WITH DIFFERENTIAL  PREPARE RBC (CROSSMATCH)  TYPE AND SCREEN   No results found. 1. Anemia   2. Acute renal failure   3. Petechial rash   4. Lower extremity edema   5. Skin rash     MDM  37 yo female with unusual rash, lower extremity edema, significant  anemia, and new acute renal failure.  Pt will need blood transfusion and further workup and evaluation of her multiple symptoms.  Unclear etiology-vasculitis, glomerulonephritis, lupus?  Pt updated on findings and plan.  Olivia Mackie, MD 11/10/12 [phone]

## 2012-11-10 NOTE — Progress Notes (Signed)
  Echocardiogram 2D Echocardiogram has been performed.  Jorje Guild 11/10/2012, 1:52 PM

## 2012-11-10 NOTE — Progress Notes (Signed)
UR Completed Deshaun Schou Graves-Bigelow, RN,BSN [phone]  

## 2012-11-10 NOTE — H&P (Signed)
[name] is an 37 y.o. female.   Chief Complaint: pt presents with skin rash; alopecia Work up reveals acute renal failure; microscopic hematuria; proteinuria Glomerulonephritis Scheduled now for random renal biopsy - poss Lupus HPI: HTN; SVT; anemia  Past Medical History  Diagnosis Date  . Hypertension   . Cardiac arrhythmia   . SVT (supraventricular tachycardia)     "today, last week, 2 wk ago; maybe 1 month ago, etc; started w/in last 3-4 yrs"(07/20/2012)  . Fainting     "~ 1 month ago; probably related to SVT" (07/20/2012)  . Heart murmur     "small" (07/20/2012)  . Shortness of breath     "related to SVT episodes" (07/20/2012)  . Anemia     "today" (07/20/2012)  . Chest pain at rest     "related to SVT" (07/20/2012)  . Sleep apnea     "don't wear mask" (07/20/2012)  . Daily headache   . Anxiety     Past Surgical History  Procedure Laterality Date  . Appendectomy  2011  . Tubal ligation  2010  . Cesarean section  2001; 2010  . Cervical biopsy  2013  . Ablation      07/21/12 Dr. Lewayne Bunting     Family History  Problem Relation Age of Onset  . Cancer Mother     breast and ovarian  . BRCA 1/2 Sister    Social History:  reports that she has never smoked. She has never used smokeless tobacco. She reports that she does not drink alcohol or use illicit drugs.  Allergies:  Allergies  Allergen Reactions  . Other     Red peppers - swelling    Medications Prior to Admission  Medication Sig Dispense Refill  . diltiazem (CARDIZEM LA) 120 MG 24 hr tablet Take 120 mg by mouth daily.      Marland Kitchen ibuprofen (ADVIL,MOTRIN) 200 MG tablet Take 400 mg by mouth every 6 (six) hours as needed for pain.        Results for orders placed during the hospital encounter of 11/10/12 (from the past 48 hour(s))  CBC WITH DIFFERENTIAL     Status: Abnormal   Collection Time    11/10/12  3:01 AM      Result Value Range   WBC 3.6 (*) 4.0 - 10.5 K/uL   RBC 2.25 (*) 3.87 - 5.11 MIL/uL   Hemoglobin 6.8 (*) 12.0 - 15.0 g/dL   Comment: CRITICAL RESULT CALLED TO, READ BACK BY AND VERIFIED WITH:     HENSON,H RN 11/10/2012 0321 JORDANS     REPEATED TO VERIFY   HCT 20.4 (*) 36.0 - 46.0 %   MCV 90.7  78.0 - 100.0 fL   MCH 30.2  26.0 - 34.0 pg   MCHC 33.3  30.0 - 36.0 g/dL   RDW 40.9 (*) 81.1 - 91.4 %   Platelets 196  150 - 400 K/uL   Neutrophils Relative % 71  43 - 77 %   Neutro Abs 2.6  1.7 - 7.7 K/uL   Lymphocytes Relative 22  12 - 46 %   Lymphs Abs 0.8  0.7 - 4.0 K/uL   Monocytes Relative 6  3 - 12 %   Monocytes Absolute 0.2  0.1 - 1.0 K/uL   Eosinophils Relative 1  0 - 5 %   Eosinophils Absolute 0.0  0.0 - 0.7 K/uL   Basophils Relative 1  0 - 1 %   Basophils Absolute 0.0  0.0 - 0.1  K/uL  BASIC METABOLIC PANEL     Status: Abnormal   Collection Time    11/10/12  3:01 AM      Result Value Range   Sodium 132 (*) 135 - 145 mEq/L   Potassium 4.4  3.5 - 5.1 mEq/L   Chloride 102  96 - 112 mEq/L   CO2 22  19 - 32 mEq/L   Glucose, Bld 90  70 - 99 mg/dL   BUN 24 (*) 6 - 23 mg/dL   Creatinine, Ser 1.61 (*) 0.50 - 1.10 mg/dL   Calcium 8.2 (*) 8.4 - 10.5 mg/dL   GFR calc non Af Amer 23 (*) >90 mL/min   GFR calc Af Amer 27 (*) >90 mL/min   Comment: (NOTE)     The eGFR has been calculated using the CKD EPI equation.     This calculation has not been validated in all clinical situations.     eGFR's persistently <90 mL/min signify possible Chronic Kidney     Disease.  HEPATIC FUNCTION PANEL     Status: Abnormal   Collection Time    11/10/12  3:01 AM      Result Value Range   Total Protein 8.0  6.0 - 8.3 g/dL   Albumin 2.0 (*) 3.5 - 5.2 g/dL   AST 096 (*) 0 - 37 U/L   ALT 28  0 - 35 U/L   Alkaline Phosphatase 55  39 - 117 U/L   Total Bilirubin 0.3  0.3 - 1.2 mg/dL   Bilirubin, Direct <0.4  0.0 - 0.3 mg/dL   Indirect Bilirubin NOT CALCULATED  0.3 - 0.9 mg/dL  SEDIMENTATION RATE     Status: Abnormal   Collection Time    11/10/12  3:39 AM      Result Value Range   Sed  Rate >140 (*) 0 - 22 mm/hr  TSH     Status: None   Collection Time    11/10/12  3:39 AM      Result Value Range   TSH 4.150  0.350 - 4.500 uIU/mL   Comment: Performed at [facility]  PREPARE RBC (CROSSMATCH)     Status: None   Collection Time    11/10/12  4:00 AM      Result Value Range   Order Confirmation ORDER PROCESSED BY BLOOD BANK    TYPE AND SCREEN     Status: None   Collection Time    11/10/12  4:00 AM      Result Value Range   ABO/RH(D) A POS     Antibody Screen NEG     Sample Expiration 11/13/2012     Unit Number [id number]     Blood Component Type RED CELLS,LR     Unit division 00     Status of Unit ISSUED     Transfusion Status OK TO TRANSFUSE     Crossmatch Result Compatible     Unit Number [id number]     Blood Component Type RED CELLS,LR     Unit division 00     Status of Unit ISSUED     Transfusion Status OK TO TRANSFUSE     Crossmatch Result Compatible    PROTIME-INR     Status: None   Collection Time    11/10/12  4:09 AM      Result Value Range   Prothrombin Time 12.5  11.6 - 15.2 seconds   INR 0.95  0.00 - 1.49  URINALYSIS, ROUTINE W REFLEX MICROSCOPIC  Status: Abnormal   Collection Time    11/10/12  5:36 AM      Result Value Range   Color, Urine YELLOW  YELLOW   APPearance CLOUDY (*) CLEAR   Specific Gravity, Urine 1.011  1.005 - 1.030   pH 6.0  5.0 - 8.0   Glucose, UA NEGATIVE  NEGATIVE mg/dL   Hgb urine dipstick LARGE (*) NEGATIVE   Bilirubin Urine NEGATIVE  NEGATIVE   Ketones, ur NEGATIVE  NEGATIVE mg/dL   Protein, ur >161 (*) NEGATIVE mg/dL   Urobilinogen, UA 0.2  0.0 - 1.0 mg/dL   Nitrite NEGATIVE  NEGATIVE   Leukocytes, UA NEGATIVE  NEGATIVE  SODIUM, URINE, RANDOM     Status: None   Collection Time    11/10/12  5:36 AM      Result Value Range   Sodium, Ur 46    CREATININE, URINE, RANDOM     Status: None   Collection Time    11/10/12  5:36 AM      Result Value Range   Creatinine, Urine 73.69    PREGNANCY, URINE      Status: None   Collection Time    11/10/12  5:36 AM      Result Value Range   Preg Test, Ur NEGATIVE  NEGATIVE   Comment:            THE SENSITIVITY OF THIS     METHODOLOGY IS >20 mIU/mL.  URINE MICROSCOPIC-ADD ON     Status: Abnormal   Collection Time    11/10/12  5:36 AM      Result Value Range   Squamous Epithelial / LPF FEW (*) RARE   WBC, UA 3-6  <3 WBC/hpf   RBC / HPF 21-50  <3 RBC/hpf   Bacteria, UA FEW (*) RARE   Casts HYALINE CASTS (*) NEGATIVE   Comment: GRANULAR CAST  LACTATE DEHYDROGENASE     Status: Abnormal   Collection Time    11/10/12 11:40 AM      Result Value Range   LDH 362 (*) 94 - 250 U/L  COMPREHENSIVE METABOLIC PANEL     Status: Abnormal   Collection Time    11/10/12 11:40 AM      Result Value Range   Sodium 135  135 - 145 mEq/L   Potassium 4.2  3.5 - 5.1 mEq/L   Chloride 106  96 - 112 mEq/L   CO2 20  19 - 32 mEq/L   Glucose, Bld 80  70 - 99 mg/dL   BUN 21  6 - 23 mg/dL   Creatinine, Ser 0.96 (*) 0.50 - 1.10 mg/dL   Calcium 8.4  8.4 - 04.5 mg/dL   Total Protein 7.6  6.0 - 8.3 g/dL   Albumin 1.8 (*) 3.5 - 5.2 g/dL   AST 85 (*) 0 - 37 U/L   ALT 20  0 - 35 U/L   Alkaline Phosphatase 54  39 - 117 U/L   Total Bilirubin 0.3  0.3 - 1.2 mg/dL   GFR calc non Af Amer 29 (*) >90 mL/min   GFR calc Af Amer 34 (*) >90 mL/min   Comment: (NOTE)     The eGFR has been calculated using the CKD EPI equation.     This calculation has not been validated in all clinical situations.     eGFR's persistently <90 mL/min signify possible Chronic Kidney     Disease.  CBC WITH DIFFERENTIAL     Status: Abnormal  Collection Time    11/10/12 11:40 AM      Result Value Range   WBC 3.3 (*) 4.0 - 10.5 K/uL   RBC 2.88 (*) 3.87 - 5.11 MIL/uL   Hemoglobin 8.7 (*) 12.0 - 15.0 g/dL   Comment: DELTA CHECK NOTED     POST TRANSFUSION SPECIMEN   HCT 25.4 (*) 36.0 - 46.0 %   MCV 88.2  78.0 - 100.0 fL   MCH 30.2  26.0 - 34.0 pg   MCHC 34.3  30.0 - 36.0 g/dL   RDW 40.9 (*) 81.1  - 15.5 %   Platelets 173  150 - 400 K/uL   Neutrophils Relative % 76  43 - 77 %   Neutro Abs 2.5  1.7 - 7.7 K/uL   Lymphocytes Relative 19  12 - 46 %   Lymphs Abs 0.6 (*) 0.7 - 4.0 K/uL   Monocytes Relative 4  3 - 12 %   Monocytes Absolute 0.1  0.1 - 1.0 K/uL   Eosinophils Relative 1  0 - 5 %   Eosinophils Absolute 0.0  0.0 - 0.7 K/uL   Basophils Relative 1  0 - 1 %   Basophils Absolute 0.0  0.0 - 0.1 K/uL   US Renal  11/10/2012   *RADIOLOGY REPORT*  Clinical Data: Acute renal failure, hypertension  RENAL/URINARY TRACT ULTRASOUND COMPLETE  Comparison:  CT 09/08/2009  Findings:  Right Kidney:  12.6 cm in length.  Parenchyma is slightly echogenic compared to the adjacent liver.  No focal lesion or hydronephrosis. The  Left Kidney:  12.9 cm. No hydronephrosis.  Well-preserved cortex. Normal size and parenchymal echotexture without focal abnormalities.  Bladder:  Incompletely distended, unremarkable with bilateral ureteral jets documented.  Incidental note made of spleen enlarged without focal lesion, 6 x 10.9 x 11.3 cm ( 389.9 cc).  IMPRESSION:  1.  No hydronephrosis. 2.  Echogenic renal parenchyma, a nonspecific indicator of medical renal disease. 3.  Splenomegaly.   Original Report Authenticated By: D. Andria Rhein, MD   Dg Chest Port 1 View  11/10/2012   *RADIOLOGY REPORT*  Clinical Data: Rule out congestion  PORTABLE CHEST - 1 VIEW  Comparison: Chest x-ray and CT PE study 07/21/2012  Findings: Single frontal view of the chest demonstrates stable enlargement of the cardiopericardial silhouette.  There is mild pulmonary vascular congestion but no overt edema.  Overall, the degree of vascular congestion is less than seen in April 2014.  No focal airspace consolidation, pleural effusion or pneumothorax.  No acute osseous abnormality.  IMPRESSION:  1.  Mild pulmonary vascular congestion without overt edema. Improved compared to 07/21/2012.  2.  Stable enlargement of the cardiopericardial silhouette likely  reflecting a combination of cardiomegaly and pericardial fluid.   Original Report Authenticated By: Malachy Moan, M.D.    Review of Systems  Constitutional: Positive for weight loss. Negative for fever.  Respiratory: Negative for shortness of breath.   Cardiovascular: Negative for chest pain.  Gastrointestinal: Negative for nausea, vomiting and abdominal pain.  Neurological: Negative for weakness.    Blood pressure 140/88, pulse 81, temperature 98.5 F (36.9 C), temperature source Oral, resp. rate 18, height 5\' 3"  (1.6 m), weight 173 lb (78.472 kg), last menstrual period 11/03/2012, SpO2 98.00%. Physical Exam   Assessment/Plan Proteinuria; ARF Skin rash Scheduled for random renal biopsy Renal MD will have nephro pathology form in chart Pt aware of procedure benefits and risks and agreeable to proceed Consent signed and in chart  Hendry Speas A 11/10/2012,  4:45 PM

## 2012-11-11 ENCOUNTER — Inpatient Hospital Stay (HOSPITAL_COMMUNITY): Payer: Medicaid Other

## 2012-11-11 LAB — CBC
HCT: 28 % — ABNORMAL LOW (ref 36.0–46.0)
Hemoglobin: 9.3 g/dL — ABNORMAL LOW (ref 12.0–15.0)
MCH: 29.4 pg (ref 26.0–34.0)
MCV: 88.6 fL (ref 78.0–100.0)
RBC: 3.16 MIL/uL — ABNORMAL LOW (ref 3.87–5.11)
WBC: 3.7 10*3/uL — ABNORMAL LOW (ref 4.0–10.5)

## 2012-11-11 LAB — URINE CULTURE

## 2012-11-11 LAB — TYPE AND SCREEN: Unit division: 0

## 2012-11-11 LAB — ANTI-NUCLEAR AB-TITER (ANA TITER)

## 2012-11-11 LAB — RENAL FUNCTION PANEL
Albumin: 1.8 g/dL — ABNORMAL LOW (ref 3.5–5.2)
CO2: 20 mEq/L (ref 19–32)
Chloride: 107 mEq/L (ref 96–112)
GFR calc non Af Amer: 33 mL/min — ABNORMAL LOW (ref >90)
Potassium: 4.6 mEq/L (ref 3.5–5.1)

## 2012-11-11 LAB — ANA: Anti Nuclear Antibody(ANA): POSITIVE — AB

## 2012-11-11 MED ORDER — FENTANYL CITRATE 0.05 MG/ML IJ SOLN
INTRAMUSCULAR | Status: AC | PRN
  Administered 2012-11-11: 50 ug via INTRAVENOUS

## 2012-11-11 MED ORDER — AMLODIPINE BESYLATE 10 MG PO TABS
10.0000 mg | ORAL_TABLET | Freq: Every day | ORAL | Status: DC
  Administered 2012-11-11 – 2012-11-15 (×5): 10 mg via ORAL
  Filled 2012-11-11 (×5): qty 1

## 2012-11-11 MED ORDER — FENTANYL CITRATE 0.05 MG/ML IJ SOLN
INTRAMUSCULAR | Status: AC
  Filled 2012-11-11: qty 4

## 2012-11-11 MED ORDER — MIDAZOLAM HCL 2 MG/2ML IJ SOLN
INTRAMUSCULAR | Status: AC
  Filled 2012-11-11: qty 4

## 2012-11-11 MED ORDER — HYDROCODONE-ACETAMINOPHEN 5-325 MG PO TABS
1.0000 | ORAL_TABLET | ORAL | Status: DC | PRN
  Administered 2012-11-12: 2 via ORAL
  Administered 2012-11-14 – 2012-11-15 (×2): 1 via ORAL
  Filled 2012-11-11 (×2): qty 1
  Filled 2012-11-11: qty 2

## 2012-11-11 MED ORDER — MIDAZOLAM HCL 2 MG/2ML IJ SOLN
INTRAMUSCULAR | Status: AC | PRN
  Administered 2012-11-11: 2 mg via INTRAVENOUS

## 2012-11-11 NOTE — Procedures (Signed)
US guided core biopsies of the left kidney lower pole.  No immediate complication.

## 2012-11-11 NOTE — Progress Notes (Signed)
I have seen and examined this patient and agree with the plan of care . 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

## 2012-11-11 NOTE — Progress Notes (Signed)
[facility] PROGRESS NOTE  [name] MWN:[id number] DOB: 21-May-1975 DOA: 11/10/2012 PCP: Karie Chimera, MD  Assessment/Plan: Rash- ? Autoimmune , the work up has been ordered, will follow the results.  AKI- She has abnormal UA, nephrology following, plan is for kidney biopsy. Hypertension- Has been taking cardizem which can also cause rash and edema , will d/c cardizem and start metoprolol 25 mg po bid. Called and discussed with her cardiologist Dr Jacinto Halim.  H/O SVT- s/p ablation, no new episodes.  Hypertension- Will add Amlodipine 10 mg po daily as BP is elevated. Anemia- has h/o DUB, also could be due to renal cause, will continue with blood transfusion and check cbc in am. Pelvic ultrasound shows fibroid, will need to follow up Gyn as outpatient. DVT Prophylaxis- SCD   Code Status: Full code Family Communication:  Discussed with Mother at bedside Disposition Plan: Home when medically stable   Consultants:  Nephrology  Procedures:  None  Antibiotics:  None  HPI/Subjective: Patient seen and examined, no new complaints. Patient has been seen by nephrology and plan is for kidney biopsy.  Objective: Filed Vitals:   11/11/12 1400  BP: 180/92  Pulse: 69  Temp:   Resp: 14    Intake/Output Summary (Last 24 hours) at 11/11/12 1539 Last data filed at 11/11/12 1300  Gross per 24 hour  Intake      3 ml  Output      0 ml  Net      3 ml   Filed Weights   11/10/12 0050 11/10/12 0636 11/11/12 0354  Weight: 79.379 kg (175 lb) 78.472 kg (173 lb) 79.606 kg (175 lb 8 oz)    Exam:   General:  Appear in no acute distress  Cardiovascular: S1S2 RRR  Respiratory: Clear bilaterally  Abdomen: Soft, nontender  Musculoskeletal: No edema  Data Reviewed: Basic Metabolic Panel:  Recent Labs Lab 11/10/12 0301 11/10/12 1140 11/11/12 0601  NA 132* 135 134*  K 4.4 4.2 4.6  CL 102 106 107  CO2 22 20 20   GLUCOSE 90 80 122*  BUN 24* 21 24*  CREATININE 2.52*  2.10* 1.88*  CALCIUM 8.2* 8.4 8.6  MG  --   --  1.8  PHOS  --   --  4.4   Liver Function Tests:  Recent Labs Lab 11/10/12 0301 11/10/12 1140 11/11/12 0601  AST 105* 85*  --   ALT 28 20  --   ALKPHOS 55 54  --   BILITOT 0.3 0.3  --   PROT 8.0 7.6  --   ALBUMIN 2.0* 1.8* 1.8*   No results found for this basename: LIPASE, AMYLASE,  in the last 168 hours No results found for this basename: AMMONIA,  in the last 168 hours CBC:  Recent Labs Lab 11/10/12 0301 11/10/12 1140 11/11/12 0601  WBC 3.6* 3.3* 3.7*  NEUTROABS 2.6 2.5  --   HGB 6.8* 8.7* 9.3*  HCT 20.4* 25.4* 28.0*  MCV 90.7 88.2 88.6  PLT 196 173 171   Cardiac Enzymes: No results found for this basename: CKTOTAL, CKMB, CKMBINDEX, TROPONINI,  in the last 168 hours BNP (last 3 results)  Recent Labs  07/22/12 0527  PROBNP 521.2*   CBG: No results found for this basename: GLUCAP,  in the last 168 hours  Recent Results (from the past 240 hour(s))  URINE CULTURE     Status: None   Collection Time    11/10/12  5:36 AM      Result Value Range  Status   Specimen Description URINE, RANDOM   Final   Special Requests CX ADDED AT 0621 ON 132440   Final   Culture  Setup Time     Final   Value: 11/10/2012 10:45     Performed at [facility]   Colony Count     Final   Value: NO GROWTH     Performed at [facility]   Culture     Final   Value: NO GROWTH     Performed at [facility]   Report Status 11-30-2012 FINAL   Final  CULTURE, BLOOD (ROUTINE X 2)     Status: None   Collection Time    11/10/12 11:40 AM      Result Value Range Status   Specimen Description BLOOD RIGHT ARM   Final   Special Requests BOTTLES DRAWN AEROBIC ONLY 10CC   Final   Culture  Setup Time     Final   Value: 11/10/2012 17:06     Performed at [facility]   Culture     Final   Value:        BLOOD CULTURE RECEIVED NO GROWTH TO DATE CULTURE WILL BE HELD FOR 5 DAYS BEFORE ISSUING A FINAL NEGATIVE REPORT      Performed at [facility]   Report Status PENDING   Incomplete  CULTURE, BLOOD (ROUTINE X 2)     Status: None   Collection Time    11/10/12 11:50 AM      Result Value Range Status   Specimen Description BLOOD LEFT HAND   Final   Special Requests BOTTLES DRAWN AEROBIC ONLY 10CC   Final   Culture  Setup Time     Final   Value: 11/10/2012 17:06     Performed at [facility]   Culture     Final   Value:        BLOOD CULTURE RECEIVED NO GROWTH TO DATE CULTURE WILL BE HELD FOR 5 DAYS BEFORE ISSUING A FINAL NEGATIVE REPORT     Performed at [facility]   Report Status PENDING   Incomplete     Studies: US Transvaginal Non-ob  11-30-12   *RADIOLOGY REPORT*  Clinical Data: Dysfunctional uterine bleeding with anemia.  TRANSABDOMINAL AND TRANSVAGINAL ULTRASOUND OF PELVIS Technique:  Both transabdominal and transvaginal ultrasound examinations of the pelvis were performed. Transabdominal technique was performed for global imaging of the pelvis including uterus, ovaries, adnexal regions, and pelvic cul-de-sac.  It was necessary to proceed with endovaginal exam following the transabdominal exam to visualize the myometrium, endometrium and adnexa.  Comparison:  CT 2011  Findings:  Uterus: Is anteverted and anteflexed and demonstrates a sagittal length of 8.4 cm, depth of 5.0 cm and width of 5.6 cm.  A focal fibroid is identified in the posterior upper uterine segment measuring 1.6 x 1.7 x 1.7 cm, mural in location.  Mild diffuse heterogeneity is otherwise seen with no other definite fibroids identified.  Endometrium: Has a tri-layered pattern with a width of 6.8 mm.  No areas of focal thickening or heterogeneity are seen  Right ovary:  Measures 3.4 x 2.2 x 2.3 cm and contains a dominant follicle  Left ovary: Measures 2.5 x 1.7 x 2.4 cm and has a normal appearance  Other findings: A small amount of simple free fluid is noted in the cul-de-sac extending into the right adnexa  IMPRESSION:  Focal fibroid with size and location as noted above.  Mild diffuse uterine heterogeneity may signify more diffuse fibroid involvement or underlying adenomyosis.  Unremarkable endometrium and ovaries.   Original Report Authenticated By: Rhodia Albright, M.D.   US Pelvis Complete  11/11/2012   *RADIOLOGY REPORT*  Clinical Data: Dysfunctional uterine bleeding with anemia.  TRANSABDOMINAL AND TRANSVAGINAL ULTRASOUND OF PELVIS Technique:  Both transabdominal and transvaginal ultrasound examinations of the pelvis were performed. Transabdominal technique was performed for global imaging of the pelvis including uterus, ovaries, adnexal regions, and pelvic cul-de-sac.  It was necessary to proceed with endovaginal exam following the transabdominal exam to visualize the myometrium, endometrium and adnexa.  Comparison:  CT 2011  Findings:  Uterus: Is anteverted and anteflexed and demonstrates a sagittal length of 8.4 cm, depth of 5.0 cm and width of 5.6 cm.  A focal fibroid is identified in the posterior upper uterine segment measuring 1.6 x 1.7 x 1.7 cm, mural in location.  Mild diffuse heterogeneity is otherwise seen with no other definite fibroids identified.  Endometrium: Has a tri-layered pattern with a width of 6.8 mm.  No areas of focal thickening or heterogeneity are seen  Right ovary:  Measures 3.4 x 2.2 x 2.3 cm and contains a dominant follicle  Left ovary: Measures 2.5 x 1.7 x 2.4 cm and has a normal appearance  Other findings: A small amount of simple free fluid is noted in the cul-de-sac extending into the right adnexa  IMPRESSION: Focal fibroid with size and location as noted above.  Mild diffuse uterine heterogeneity may signify more diffuse fibroid involvement or underlying adenomyosis.  Unremarkable endometrium and ovaries.   Original Report Authenticated By: Rhodia Albright, M.D.   US Renal  11/10/2012   *RADIOLOGY REPORT*  Clinical Data: Acute renal failure, hypertension  RENAL/URINARY TRACT ULTRASOUND  COMPLETE  Comparison:  CT 09/08/2009  Findings:  Right Kidney:  12.6 cm in length.  Parenchyma is slightly echogenic compared to the adjacent liver.  No focal lesion or hydronephrosis. The  Left Kidney:  12.9 cm. No hydronephrosis.  Well-preserved cortex. Normal size and parenchymal echotexture without focal abnormalities.  Bladder:  Incompletely distended, unremarkable with bilateral ureteral jets documented.  Incidental note made of spleen enlarged without focal lesion, 6 x 10.9 x 11.3 cm ( 389.9 cc).  IMPRESSION:  1.  No hydronephrosis. 2.  Echogenic renal parenchyma, a nonspecific indicator of medical renal disease. 3.  Splenomegaly.   Original Report Authenticated By: D. Andria Rhein, MD   US Biopsy  11/11/2012   *RADIOLOGY REPORT*  Clinical history:37 year old female with acute renal insufficiency. The patient has microhematuria and proteinuria.  PROCEDURE(S): ULTRASOUND GUIDED LEFT RENAL BIOPSY  Physician: Rachelle Hora. Henn, MD  Medications:Versed 2 mg, Fentanyl 100 mcg. A radiology nurse monitored the patient for moderate sedation.  Moderate sedation time:25 minutes  Fluoroscopy time: None  Procedure:The procedure was explained to the patient.  The risks and benefits of the procedure were discussed and the patient's questions were addressed.  Informed consent was obtained from the patient.  The patient was placed prone and both kidneys were evaluated with ultrasound.  The left kidney was felt to be most accessible for percutaneous biopsy.  The left lower pole was targeted.  The left flank was prepped and draped in a sterile fashion.  The skin was anesthetized with 1% lidocaine.  A 15 gauge guide needle was directed towards the lower pole cortex.  Two core biopsies were obtained with a 16 gauge device within the lower pole cortex.  No significant bleeding was identified.  A Gelfoam slurry was injected through the 15 gauge guide needle as it was slowly removed.  Findings:Core biopsies obtained from the left  kidney lower pole. No significant bleeding following the procedure.  Complications: None  Impression:Successful ultrasound guided core biopsies of the left kidney.   Original Report Authenticated By: Richarda Overlie, M.D.   Dg Chest Port 1 View  11/10/2012   *RADIOLOGY REPORT*  Clinical Data: Rule out congestion  PORTABLE CHEST - 1 VIEW  Comparison: Chest x-ray and CT PE study 07/21/2012  Findings: Single frontal view of the chest demonstrates stable enlargement of the cardiopericardial silhouette.  There is mild pulmonary vascular congestion but no overt edema.  Overall, the degree of vascular congestion is less than seen in April 2014.  No focal airspace consolidation, pleural effusion or pneumothorax.  No acute osseous abnormality.  IMPRESSION:  1.  Mild pulmonary vascular congestion without overt edema. Improved compared to 07/21/2012.  2.  Stable enlargement of the cardiopericardial silhouette likely reflecting a combination of cardiomegaly and pericardial fluid.   Original Report Authenticated By: Malachy Moan, M.D.    Scheduled Meds: . amLODipine  10 mg Oral Daily  . fentaNYL      . metoprolol tartrate  25 mg Oral BID  . midazolam      . predniSONE  60 mg Oral Q breakfast  . sodium chloride  3 mL Intravenous Q12H  . sodium chloride  3 mL Intravenous Q12H   Continuous Infusions:   Principal Problem:   Acute renal failure Active Problems:   Anemia   Hypertension   Skin rash   Lower extremity edema    Time spent: 25 min    [facility]  [facility] Pager [phone]. If 7PM-7AM, please contact night-coverage at [email or web address], password [facility] 11/11/2012, 3:39 PM  LOS: 1 day

## 2012-11-11 NOTE — Progress Notes (Signed)
[facility] ROUNDING NOTE   Subjective:   [name] was seen and examined at bedside this morning.  She reports improvement in her lower extremity swelling and rash since yesterday.  She will have her renal biopsy today and has also been reading on lupus.    Objective:  Vital signs in last 24 hours:  Temp:  [98.3 F (36.8 C)-99.8 F (37.7 C)] 98.9 F (37.2 C) (08/15 0354) Pulse Rate:  [77-95] 77 (08/15 0354) Resp:  [16-18] 18 (08/15 0354) BP: (139-173)/(77-97) 153/84 mmHg (08/15 0615) SpO2:  [98 %-100 %] 98 % (08/15 0354) Weight:  [175 lb 8 oz (79.606 kg)] 175 lb 8 oz (79.606 kg) (08/15 0354)  Weight change: 8 oz (0.227 kg) Filed Weights   11/10/12 0050 11/10/12 0636 11/11/12 0354  Weight: 175 lb (79.379 kg) 173 lb (78.472 kg) 175 lb 8 oz (79.606 kg)   Intake/Output: I/O last 3 completed shifts: In: 712.5 [Blood:712.5] Out: -    Physical Exam:  Vitals reviewed.  General: sitting up on the side of bed, NAD HEENT: PERRL, EOMI  Cardiac: RRR, SEM  Pulm: clear to auscultation bilaterally, no wheezes, rales, or rhonchi  Abd: soft, obese, nontender, nondistended, BS present  Ext: warm and well perfused, b/l lower extremity edema, +2dp b/l, mild tenderness to palpation of toes on right foot. Scab on dorsal surface of right foot non-draining  Neuro: alert and oriented X3, cranial nerves II-XII grossly intact, strength and sensation to light touch equal in bilateral upper and lower extremities  Skin: non-blanchable red petechial rash on b/l lower extremities and plantar surface of feet including toes and extending up to knees with few red spots on b/l thighs  Basic Metabolic Panel:  Recent Labs Lab 11/10/12 0301 11/10/12 1140 11/11/12 0601  NA 132* 135 134*  K 4.4 4.2 4.6  CL 102 106 107  CO2 22 20 20   GLUCOSE 90 80 122*  BUN 24* 21 24*  CREATININE 2.52* 2.10* 1.88*  CALCIUM 8.2* 8.4 8.6  MG  --   --  1.8  PHOS  --   --  4.4   Liver Function  Tests:  Recent Labs Lab 11/10/12 0301 11/10/12 1140 11/11/12 0601  AST 105* 85*  --   ALT 28 20  --   ALKPHOS 55 54  --   BILITOT 0.3 0.3  --   PROT 8.0 7.6  --   ALBUMIN 2.0* 1.8* 1.8*   CBC:  Recent Labs Lab 11/10/12 0301 11/10/12 1140 11/11/12 0601  WBC 3.6* 3.3* 3.7*  NEUTROABS 2.6 2.5  --   HGB 6.8* 8.7* 9.3*  HCT 20.4* 25.4* 28.0*  MCV 90.7 88.2 88.6  PLT 196 173 171   Microbiology: Results for orders placed during the hospital encounter of 11/10/12  CULTURE, BLOOD (ROUTINE X 2)     Status: None   Collection Time    11/10/12 11:40 AM      Result Value Range Status   Specimen Description BLOOD RIGHT ARM   Final   Special Requests BOTTLES DRAWN AEROBIC ONLY 10CC   Final   Culture  Setup Time     Final   Value: 11/10/2012 17:06     Performed at [facility]   Culture     Final   Value:        BLOOD CULTURE RECEIVED NO GROWTH TO DATE CULTURE WILL BE HELD FOR 5 DAYS BEFORE ISSUING A FINAL NEGATIVE REPORT     Performed at [facility]   Report Status PENDING   Incomplete  CULTURE, BLOOD (ROUTINE X 2)     Status: None   Collection Time    11/10/12 11:50 AM      Result Value Range Status   Specimen Description BLOOD LEFT HAND   Final   Special Requests BOTTLES DRAWN AEROBIC ONLY 10CC   Final   Culture  Setup Time     Final   Value: 11/10/2012 17:06     Performed at [facility]   Culture     Final   Value:        BLOOD CULTURE RECEIVED NO GROWTH TO DATE CULTURE WILL BE HELD FOR 5 DAYS BEFORE ISSUING A FINAL NEGATIVE REPORT     Performed at [facility]   Report Status PENDING   Incomplete   Coagulation Studies:  Recent Labs  11/10/12 0409  LABPROT 12.5  INR 0.95   Urinalysis:  Recent Labs  11/10/12 0536  COLORURINE YELLOW  LABSPEC 1.011  PHURINE 6.0  GLUCOSEU NEGATIVE  HGBUR LARGE*  BILIRUBINUR NEGATIVE  KETONESUR NEGATIVE  PROTEINUR >300*  UROBILINOGEN 0.2  NITRITE NEGATIVE  LEUKOCYTESUR NEGATIVE     Imaging: US Renal  11/10/2012   *RADIOLOGY REPORT*  Clinical Data: Acute renal failure, hypertension  RENAL/URINARY TRACT ULTRASOUND COMPLETE  Comparison:  CT 09/08/2009  Findings:  Right Kidney:  12.6 cm in length.  Parenchyma is slightly echogenic compared to the adjacent liver.  No focal lesion or hydronephrosis. The  Left Kidney:  12.9 cm. No hydronephrosis.  Well-preserved cortex. Normal size and parenchymal echotexture without focal abnormalities.  Bladder:  Incompletely distended, unremarkable with bilateral ureteral jets documented.  Incidental note made of spleen enlarged without focal lesion, 6 x 10.9 x 11.3 cm ( 389.9 cc).  IMPRESSION:  1.  No hydronephrosis. 2.  Echogenic renal parenchyma, a nonspecific indicator of medical renal disease. 3.  Splenomegaly.   Original Report Authenticated By: D. Andria Rhein, MD   Dg Chest Port 1 View  11/10/2012   *RADIOLOGY REPORT*  Clinical Data: Rule out congestion  PORTABLE CHEST - 1 VIEW  Comparison: Chest x-ray and CT PE study 07/21/2012  Findings: Single frontal view of the chest demonstrates stable enlargement of the cardiopericardial silhouette.  There is mild pulmonary vascular congestion but no overt edema.  Overall, the degree of vascular congestion is less than seen in April 2014.  No focal airspace consolidation, pleural effusion or pneumothorax.  No acute osseous abnormality.  IMPRESSION:  1.  Mild pulmonary vascular congestion without overt edema. Improved compared to 07/21/2012.  2.  Stable enlargement of the cardiopericardial silhouette likely reflecting a combination of cardiomegaly and pericardial fluid.   Original Report Authenticated By: Malachy Moan, M.D.   Medications:     . metoprolol tartrate  25 mg Oral BID  . predniSONE  60 mg Oral Q breakfast  . sodium chloride  3 mL Intravenous Q12H  . sodium chloride  3 mL Intravenous Q12H   acetaminophen, acetaminophen, ondansetron (ZOFRAN) IV, ondansetron  Assessment/ Plan:  Ms.  [name] is a 37 y.o.African American female with a PMHx of HTN, SVT s/p ablation, chronic anemia with heavy menses, and anxiety, who was admitted to [facility] on 11/10/2012 for evaluation of symptomatic anemia, lower extremity edema and rash. Found to have AKI with hematuria, proteinuria, and hypalbuminemia.   AKI--improving. Cr trending down.  Scheduled for renal biopsy today. Low c3 and c4.  HIV negative.  ANA positive.  Renal ultrasound: no  hydronephrosis, echogenic renal parenchyma, and splenomegaly. ?SLE vs. Vasculitis vs. HSP and also considering nephrotic syndrome differentials with possible ?membranous glomerulonephritis.   Recent Labs Lab 11/10/12 0301 11/10/12 1140 11/11/12 0601  CREATININE 2.52* 2.10* 1.88*   -renal biopsy today -trend renal function -f/u serology: ANCA panel, total complement, RMSF  Malignancy is also included in her differential given significant un-intentional weight loss, fever, chills, night-sweats, heavy menses, and CT angio in April 2014 showing multiple biaxiallary, mediastinal, and right hilar lymphadenopathy along with 4mm RUL nodule. She also has significant family history of breast and ovarian cancer, and sister testing positive for BRCA gene.  -renal biopsy today -serology pending -continue prednisone  B/L lower extremity rash--improved today, started on prednisone 8/14.  Non-blanchable red purpuric petechial rash, please see above discussion including differentials ?SLE, vasculitis, infectious/insect bite, medications, and HSP. Associated with anemia and AKI as well. Platelets steady 170s and have been low in the past down to 140s. She provided consent for photographs to be used for chart documentation and education.     -continue to monitor -f/u echo: moderate concentric hypertrophy, EF 55-60%, mild pulmonary HTN with trivial pericardial effusion -prednisone 60mg  qd  Anemia--Hb on admission 6.8, s/p 2 units PRBC and Hb improved to 9.3. Hx of heavy menses.  LMP 1-2 weeks ago. Microscopic hematuria on u/a.   Recent Labs Lab 11/10/12 0301 11/10/12 1140 11/11/12 0601  HCT 20.4* 25.4* 28.0*   -pelvic ultrasound -trend Hb  Diet: regular DVT PPX - SCDs  Dispo--pending further clinical improvement  Case discussed and patient seen with Dr. Hyman Hopes  Signed: Darden Palmer, MD PGY-2, Internal Medicine Resident Pager: [phone]  11/11/2012,1:51 PM

## 2012-11-12 DIAGNOSIS — I10 Essential (primary) hypertension: Secondary | ICD-10-CM

## 2012-11-12 LAB — RENAL FUNCTION PANEL
CO2: 21 mEq/L (ref 19–32)
Calcium: 8.7 mg/dL (ref 8.4–10.5)
Chloride: 105 mEq/L (ref 96–112)
GFR calc Af Amer: 41 mL/min — ABNORMAL LOW (ref >90)
GFR calc non Af Amer: 36 mL/min — ABNORMAL LOW (ref >90)
Potassium: 4.3 mEq/L (ref 3.5–5.1)
Sodium: 134 mEq/L — ABNORMAL LOW (ref 135–145)

## 2012-11-12 MED ORDER — FLUCONAZOLE 100MG IVPB
100.0000 mg | INTRAVENOUS | Status: DC
  Administered 2012-11-12: 100 mg via INTRAVENOUS
  Filled 2012-11-12: qty 50

## 2012-11-12 MED ORDER — LIDOCAINE VISCOUS 2 % MT SOLN
15.0000 mL | OROMUCOSAL | Status: DC | PRN
  Filled 2012-11-12: qty 15

## 2012-11-12 MED ORDER — FLUCONAZOLE 100 MG PO TABS
100.0000 mg | ORAL_TABLET | Freq: Every day | ORAL | Status: DC
  Administered 2012-11-13 – 2012-11-15 (×3): 100 mg via ORAL
  Filled 2012-11-12 (×3): qty 1

## 2012-11-12 NOTE — Progress Notes (Signed)
[facility] PROGRESS NOTE Interim History: 37 y.o. female with history of SVT status post ablation, chronic anemia and hypertension presented to the ER because of lower extremity edema and rash in the lower extremities. Patient states that her lower extremity edema started off yesterday and along with it the rash appeared in the lower extremities    Assessment/Plan: Acute renal failure/  Skin rash/Lower extremity edema: - Broad differential:  (Complements low) SLE vs. Vasculitis as well including HSP. Malignancy is also included in her differential given significant un-intentional weight loss, fever, chills, night-sweats, with CT angio in April 2014 showing multiple biaxiallary, mediastinal, and right hilar lymphadenopathy along with 4mm RUL nodule (lymphoma) - Cr. Improving, Low c3 and c4. HIV negative. ANA positive - Renal ultrasound: no hydronephrosis, echogenic renal parenchyma, and splenomegaly - renal biopsy 8.15.2014 - started on prednisone. - echo: moderate concentric hypertrophy, EF 55-60%, mild pulmonary HTN with trivial pericardial effusion  Oral thrush: - immunocompromise. - Also on steroids. Start diflucan - viscose lidocaine gel.   Hypertension - BP mildly high changed from Cardizem to metorpolol. - HR stable. - Anemia - s/p 2 units of PRBC - Hbg 9.3 ? Due to CKD.  Code Status: full Family Communication: none  Disposition Plan: inpatient   Consultants:  Renal  Procedures:  Renal Biopsy  Antibiotics:  none  HPI/Subjective: - Complaining of pain in the left index, first interphalangeal joint. - swelling improved. - some sore throat  Objective: Filed Vitals:   11/11/12 1530 11/11/12 1600 11/11/12 1819 11/11/12 2100  BP: 187/94 194/95 156/87 156/81  Pulse: 58 57  71  Temp:    97.8 F (36.6 C)  TempSrc:    Oral  Resp:  16  18  Height:      Weight:      SpO2: 99% 100%  98%    Intake/Output Summary (Last 24 hours) at 11/12/12 0742 Last  data filed at 11/11/12 1300  Gross per 24 hour  Intake      3 ml  Output      0 ml  Net      3 ml   Filed Weights   11/10/12 0050 11/10/12 0636 11/11/12 0354  Weight: 79.379 kg (175 lb) 78.472 kg (173 lb) 79.606 kg (175 lb 8 oz)    Exam:  General: Alert, awake, oriented x3, in no acute distress.  HEENT: No bruits, no goiter.  Heart: Regular rate and rhythm, without murmurs, rubs, gallops.  Lungs: Good air movement, clear to ascultation Abdomen: Soft, nontender, nondistended, positive bowel sounds.  Skin: Non-blanchable red purpuric petechial rash    Data Reviewed: Basic Metabolic Panel:  Recent Labs Lab 11/10/12 0301 11/10/12 1140 11/11/12 0601 11/12/12 0440  NA 132* 135 134* 134*  K 4.4 4.2 4.6 4.3  CL 102 106 107 105  CO2 22 20 20 21   GLUCOSE 90 80 122* 98  BUN 24* 21 24* 34*  CREATININE 2.52* 2.10* 1.88* 1.78*  CALCIUM 8.2* 8.4 8.6 8.7  MG  --   --  1.8  --   PHOS  --   --  4.4 4.6   Liver Function Tests:  Recent Labs Lab 11/10/12 0301 11/10/12 1140 11/11/12 0601 11/12/12 0440  AST 105* 85*  --   --   ALT 28 20  --   --   ALKPHOS 55 54  --   --   BILITOT 0.3 0.3  --   --   PROT 8.0 7.6  --   --  ALBUMIN 2.0* 1.8* 1.8* 1.8*   No results found for this basename: LIPASE, AMYLASE,  in the last 168 hours No results found for this basename: AMMONIA,  in the last 168 hours CBC:  Recent Labs Lab 11/10/12 0301 11/10/12 1140 11/17/2012 0601  WBC 3.6* 3.3* 3.7*  NEUTROABS 2.6 2.5  --   HGB 6.8* 8.7* 9.3*  HCT 20.4* 25.4* 28.0*  MCV 90.7 88.2 88.6  PLT 196 173 171   Cardiac Enzymes: No results found for this basename: CKTOTAL, CKMB, CKMBINDEX, TROPONINI,  in the last 168 hours BNP (last 3 results)  Recent Labs  07/22/12 0527  PROBNP 521.2*   CBG: No results found for this basename: GLUCAP,  in the last 168 hours  Recent Results (from the past 240 hour(s))  URINE CULTURE     Status: None   Collection Time    11/10/12  5:36 AM      Result  Value Range Status   Specimen Description URINE, RANDOM   Final   Special Requests CX ADDED AT 0621 ON 161096   Final   Culture  Setup Time     Final   Value: 11/10/2012 10:45     Performed at [facility] Count     Final   Value: NO GROWTH     Performed at [facility]   Culture     Final   Value: NO GROWTH     Performed at [facility]   Report Status Nov 17, 2012 FINAL   Final  CULTURE, BLOOD (ROUTINE X 2)     Status: None   Collection Time    11/10/12 11:40 AM      Result Value Range Status   Specimen Description BLOOD RIGHT ARM   Final   Special Requests BOTTLES DRAWN AEROBIC ONLY 10CC   Final   Culture  Setup Time     Final   Value: 11/10/2012 17:06     Performed at [facility]   Culture     Final   Value:        BLOOD CULTURE RECEIVED NO GROWTH TO DATE CULTURE WILL BE HELD FOR 5 DAYS BEFORE ISSUING A FINAL NEGATIVE REPORT     Performed at [facility]   Report Status PENDING   Incomplete  CULTURE, BLOOD (ROUTINE X 2)     Status: None   Collection Time    11/10/12 11:50 AM      Result Value Range Status   Specimen Description BLOOD LEFT HAND   Final   Special Requests BOTTLES DRAWN AEROBIC ONLY 10CC   Final   Culture  Setup Time     Final   Value: 11/10/2012 17:06     Performed at [facility]   Culture     Final   Value:        BLOOD CULTURE RECEIVED NO GROWTH TO DATE CULTURE WILL BE HELD FOR 5 DAYS BEFORE ISSUING A FINAL NEGATIVE REPORT     Performed at [facility]   Report Status PENDING   Incomplete     Studies: US Transvaginal Non-ob  2012/11/17   *RADIOLOGY REPORT*  Clinical Data: Dysfunctional uterine bleeding with anemia.  TRANSABDOMINAL AND TRANSVAGINAL ULTRASOUND OF PELVIS Technique:  Both transabdominal and transvaginal ultrasound examinations of the pelvis were performed. Transabdominal technique was performed for global imaging of the pelvis including uterus, ovaries, adnexal regions, and  pelvic cul-de-sac.  It was necessary to proceed with endovaginal  exam following the transabdominal exam to visualize the myometrium, endometrium and adnexa.  Comparison:  CT 2011  Findings:  Uterus: Is anteverted and anteflexed and demonstrates a sagittal length of 8.4 cm, depth of 5.0 cm and width of 5.6 cm.  A focal fibroid is identified in the posterior upper uterine segment measuring 1.6 x 1.7 x 1.7 cm, mural in location.  Mild diffuse heterogeneity is otherwise seen with no other definite fibroids identified.  Endometrium: Has a tri-layered pattern with a width of 6.8 mm.  No areas of focal thickening or heterogeneity are seen  Right ovary:  Measures 3.4 x 2.2 x 2.3 cm and contains a dominant follicle  Left ovary: Measures 2.5 x 1.7 x 2.4 cm and has a normal appearance  Other findings: A small amount of simple free fluid is noted in the cul-de-sac extending into the right adnexa  IMPRESSION: Focal fibroid with size and location as noted above.  Mild diffuse uterine heterogeneity may signify more diffuse fibroid involvement or underlying adenomyosis.  Unremarkable endometrium and ovaries.   Original Report Authenticated By: Rhodia Albright, M.D.   US Pelvis Complete  11/11/2012   *RADIOLOGY REPORT*  Clinical Data: Dysfunctional uterine bleeding with anemia.  TRANSABDOMINAL AND TRANSVAGINAL ULTRASOUND OF PELVIS Technique:  Both transabdominal and transvaginal ultrasound examinations of the pelvis were performed. Transabdominal technique was performed for global imaging of the pelvis including uterus, ovaries, adnexal regions, and pelvic cul-de-sac.  It was necessary to proceed with endovaginal exam following the transabdominal exam to visualize the myometrium, endometrium and adnexa.  Comparison:  CT 2011  Findings:  Uterus: Is anteverted and anteflexed and demonstrates a sagittal length of 8.4 cm, depth of 5.0 cm and width of 5.6 cm.  A focal fibroid is identified in the posterior upper uterine segment  measuring 1.6 x 1.7 x 1.7 cm, mural in location.  Mild diffuse heterogeneity is otherwise seen with no other definite fibroids identified.  Endometrium: Has a tri-layered pattern with a width of 6.8 mm.  No areas of focal thickening or heterogeneity are seen  Right ovary:  Measures 3.4 x 2.2 x 2.3 cm and contains a dominant follicle  Left ovary: Measures 2.5 x 1.7 x 2.4 cm and has a normal appearance  Other findings: A small amount of simple free fluid is noted in the cul-de-sac extending into the right adnexa  IMPRESSION: Focal fibroid with size and location as noted above.  Mild diffuse uterine heterogeneity may signify more diffuse fibroid involvement or underlying adenomyosis.  Unremarkable endometrium and ovaries.   Original Report Authenticated By: Rhodia Albright, M.D.   US Renal  11/10/2012   *RADIOLOGY REPORT*  Clinical Data: Acute renal failure, hypertension  RENAL/URINARY TRACT ULTRASOUND COMPLETE  Comparison:  CT 09/08/2009  Findings:  Right Kidney:  12.6 cm in length.  Parenchyma is slightly echogenic compared to the adjacent liver.  No focal lesion or hydronephrosis. The  Left Kidney:  12.9 cm. No hydronephrosis.  Well-preserved cortex. Normal size and parenchymal echotexture without focal abnormalities.  Bladder:  Incompletely distended, unremarkable with bilateral ureteral jets documented.  Incidental note made of spleen enlarged without focal lesion, 6 x 10.9 x 11.3 cm ( 389.9 cc).  IMPRESSION:  1.  No hydronephrosis. 2.  Echogenic renal parenchyma, a nonspecific indicator of medical renal disease. 3.  Splenomegaly.   Original Report Authenticated By: D. Andria Rhein, MD   US Biopsy  11/11/2012   *RADIOLOGY REPORT*  Clinical history:37 year old female with acute renal insufficiency. The patient has  microhematuria and proteinuria.  PROCEDURE(S): ULTRASOUND GUIDED LEFT RENAL BIOPSY  Physician: Rachelle Hora. Henn, MD  Medications:Versed 2 mg, Fentanyl 100 mcg. A radiology nurse monitored the patient for  moderate sedation.  Moderate sedation time:25 minutes  Fluoroscopy time: None  Procedure:The procedure was explained to the patient.  The risks and benefits of the procedure were discussed and the patient's questions were addressed.  Informed consent was obtained from the patient.  The patient was placed prone and both kidneys were evaluated with ultrasound.  The left kidney was felt to be most accessible for percutaneous biopsy.  The left lower pole was targeted.  The left flank was prepped and draped in a sterile fashion.  The skin was anesthetized with 1% lidocaine.  A 15 gauge guide needle was directed towards the lower pole cortex.  Two core biopsies were obtained with a 16 gauge device within the lower pole cortex.  No significant bleeding was identified.  A Gelfoam slurry was injected through the 15 gauge guide needle as it was slowly removed.  Findings:Core biopsies obtained from the left kidney lower pole. No significant bleeding following the procedure.  Complications: None  Impression:Successful ultrasound guided core biopsies of the left kidney.   Original Report Authenticated By: Richarda Overlie, M.D.    Scheduled Meds: . amLODipine  10 mg Oral Daily  . metoprolol tartrate  25 mg Oral BID  . predniSONE  60 mg Oral Q breakfast  . sodium chloride  3 mL Intravenous Q12H  . sodium chloride  3 mL Intravenous Q12H   Continuous Infusions:    Marinda Elk  [facility] Pager [phone]. If 8PM-8AM, please contact night-coverage at [email or web address], password [facility] 11/12/2012, 7:42 AM  LOS: 2 days

## 2012-11-12 NOTE — Progress Notes (Signed)
I have seen and examined this patient and agree with the plan of care . Awaiting results of renal biopsy. Has quite active lupus and using prednisone 60mg  per day. Additional therapy will depend on biopsy.  Lorissa Kishbaugh W 11/12/2012, 12:00 PM

## 2012-11-12 NOTE — Progress Notes (Signed)
[facility] ROUNDING NOTE   Subjective:   [name] was seen and examined at bedside this morning.  She reports welling of her upper extremities today but slight improvement in her lower extremity rash and swelling today.  She is s/p renal biopsy yesterday.   Objective:  Vital signs in last 24 hours:  Temp:  [97.8 F (36.6 C)] 97.8 F (36.6 C) (08/15 2100) Pulse Rate:  [57-84] 84 (08/16 0932) Resp:  [11-18] 18 (08/15 2100) BP: (133-195)/(71-107) 133/71 mmHg (08/16 0929) SpO2:  [92 %-100 %] 98 % (08/15 2100)  Weight change:  Filed Weights   11/10/12 0050 11/10/12 0636 11/11/12 0354  Weight: 175 lb (79.379 kg) 173 lb (78.472 kg) 175 lb 8 oz (79.606 kg)   Intake/Output: I/O last 3 completed shifts: In: 3 [I.V.:3] Out: -    Physical Exam:  Vitals reviewed.  General: sitting up on the side of bed, NAD HEENT: PERRL, EOMI  Cardiac: RRR, SEM  Pulm: clear to auscultation bilaterally, no wheezes, rales, or rhonchi  Abd: soft, obese, nontender, nondistended, BS present  Ext: warm and well perfused, b/l lower extremity edema, +2dp b/l, mild tenderness to palpation of toes on right foot. Scab on dorsal surface of right foot non-draining  Neuro: alert and oriented X3, cranial nerves II-XII grossly intact, strength and sensation to light touch equal in bilateral upper and lower extremities  Skin: non-blanchable red petechial rash on b/l lower extremities and plantar surface of feet including toes and extending up to knees with few red spots on b/l thighs  Basic Metabolic Panel:  Recent Labs Lab 11/10/12 0301 11/10/12 1140 11/11/12 0601 11/12/12 0440  NA 132* 135 134* 134*  K 4.4 4.2 4.6 4.3  CL 102 106 107 105  CO2 22 20 20 21   GLUCOSE 90 80 122* 98  BUN 24* 21 24* 34*  CREATININE 2.52* 2.10* 1.88* 1.78*  CALCIUM 8.2* 8.4 8.6 8.7  MG  --   --  1.8  --   PHOS  --   --  4.4 4.6   Liver Function Tests:  Recent Labs Lab 11/10/12 0301 11/10/12 1140  11/11/12 0601 11/12/12 0440  AST 105* 85*  --   --   ALT 28 20  --   --   ALKPHOS 55 54  --   --   BILITOT 0.3 0.3  --   --   PROT 8.0 7.6  --   --   ALBUMIN 2.0* 1.8* 1.8* 1.8*   CBC:  Recent Labs Lab 11/10/12 0301 11/10/12 1140 11/11/12 0601  WBC 3.6* 3.3* 3.7*  NEUTROABS 2.6 2.5  --   HGB 6.8* 8.7* 9.3*  HCT 20.4* 25.4* 28.0*  MCV 90.7 88.2 88.6  PLT 196 173 171   Microbiology: Results for orders placed during the hospital encounter of 11/10/12  URINE CULTURE     Status: None   Collection Time    11/10/12  5:36 AM      Result Value Range Status   Specimen Description URINE, RANDOM   Final   Special Requests CX ADDED AT 0621 ON 161096   Final   Culture  Setup Time     Final   Value: 11/10/2012 10:45     Performed at [facility] Count     Final   Value: NO GROWTH     Performed at [facility]   Culture     Final   Value: NO GROWTH  Performed at [facility]   Report Status 11/11/2012 FINAL   Final  CULTURE, BLOOD (ROUTINE X 2)     Status: None   Collection Time    11/10/12 11:40 AM      Result Value Range Status   Specimen Description BLOOD RIGHT ARM   Final   Special Requests BOTTLES DRAWN AEROBIC ONLY 10CC   Final   Culture  Setup Time     Final   Value: 11/10/2012 17:06     Performed at [facility]   Culture     Final   Value:        BLOOD CULTURE RECEIVED NO GROWTH TO DATE CULTURE WILL BE HELD FOR 5 DAYS BEFORE ISSUING A FINAL NEGATIVE REPORT     Performed at [facility]   Report Status PENDING   Incomplete  CULTURE, BLOOD (ROUTINE X 2)     Status: None   Collection Time    11/10/12 11:50 AM      Result Value Range Status   Specimen Description BLOOD LEFT HAND   Final   Special Requests BOTTLES DRAWN AEROBIC ONLY 10CC   Final   Culture  Setup Time     Final   Value: 11/10/2012 17:06     Performed at [facility]   Culture     Final   Value:        BLOOD CULTURE RECEIVED NO GROWTH TO  DATE CULTURE WILL BE HELD FOR 5 DAYS BEFORE ISSUING A FINAL NEGATIVE REPORT     Performed at [facility]   Report Status PENDING   Incomplete   Coagulation Studies:  Recent Labs  11/10/12 0409  LABPROT 12.5  INR 0.95   Urinalysis:  Recent Labs  11/10/12 0536  COLORURINE YELLOW  LABSPEC 1.011  PHURINE 6.0  GLUCOSEU NEGATIVE  HGBUR LARGE*  BILIRUBINUR NEGATIVE  KETONESUR NEGATIVE  PROTEINUR >300*  UROBILINOGEN 0.2  NITRITE NEGATIVE  LEUKOCYTESUR NEGATIVE    Imaging: US Transvaginal Non-ob  11/11/2012   *RADIOLOGY REPORT*  Clinical Data: Dysfunctional uterine bleeding with anemia.  TRANSABDOMINAL AND TRANSVAGINAL ULTRASOUND OF PELVIS Technique:  Both transabdominal and transvaginal ultrasound examinations of the pelvis were performed. Transabdominal technique was performed for global imaging of the pelvis including uterus, ovaries, adnexal regions, and pelvic cul-de-sac.  It was necessary to proceed with endovaginal exam following the transabdominal exam to visualize the myometrium, endometrium and adnexa.  Comparison:  CT 2011  Findings:  Uterus: Is anteverted and anteflexed and demonstrates a sagittal length of 8.4 cm, depth of 5.0 cm and width of 5.6 cm.  A focal fibroid is identified in the posterior upper uterine segment measuring 1.6 x 1.7 x 1.7 cm, mural in location.  Mild diffuse heterogeneity is otherwise seen with no other definite fibroids identified.  Endometrium: Has a tri-layered pattern with a width of 6.8 mm.  No areas of focal thickening or heterogeneity are seen  Right ovary:  Measures 3.4 x 2.2 x 2.3 cm and contains a dominant follicle  Left ovary: Measures 2.5 x 1.7 x 2.4 cm and has a normal appearance  Other findings: A small amount of simple free fluid is noted in the cul-de-sac extending into the right adnexa  IMPRESSION: Focal fibroid with size and location as noted above.  Mild diffuse uterine heterogeneity may signify more diffuse fibroid involvement or  underlying adenomyosis.  Unremarkable endometrium and ovaries.   Original Report Authenticated By: Rhodia Albright, M.D.   US Pelvis  Complete  11/11/2012   *RADIOLOGY REPORT*  Clinical Data: Dysfunctional uterine bleeding with anemia.  TRANSABDOMINAL AND TRANSVAGINAL ULTRASOUND OF PELVIS Technique:  Both transabdominal and transvaginal ultrasound examinations of the pelvis were performed. Transabdominal technique was performed for global imaging of the pelvis including uterus, ovaries, adnexal regions, and pelvic cul-de-sac.  It was necessary to proceed with endovaginal exam following the transabdominal exam to visualize the myometrium, endometrium and adnexa.  Comparison:  CT 2011  Findings:  Uterus: Is anteverted and anteflexed and demonstrates a sagittal length of 8.4 cm, depth of 5.0 cm and width of 5.6 cm.  A focal fibroid is identified in the posterior upper uterine segment measuring 1.6 x 1.7 x 1.7 cm, mural in location.  Mild diffuse heterogeneity is otherwise seen with no other definite fibroids identified.  Endometrium: Has a tri-layered pattern with a width of 6.8 mm.  No areas of focal thickening or heterogeneity are seen  Right ovary:  Measures 3.4 x 2.2 x 2.3 cm and contains a dominant follicle  Left ovary: Measures 2.5 x 1.7 x 2.4 cm and has a normal appearance  Other findings: A small amount of simple free fluid is noted in the cul-de-sac extending into the right adnexa  IMPRESSION: Focal fibroid with size and location as noted above.  Mild diffuse uterine heterogeneity may signify more diffuse fibroid involvement or underlying adenomyosis.  Unremarkable endometrium and ovaries.   Original Report Authenticated By: Rhodia Albright, M.D.   US Renal  11/10/2012   *RADIOLOGY REPORT*  Clinical Data: Acute renal failure, hypertension  RENAL/URINARY TRACT ULTRASOUND COMPLETE  Comparison:  CT 09/08/2009  Findings:  Right Kidney:  12.6 cm in length.  Parenchyma is slightly echogenic compared to the  adjacent liver.  No focal lesion or hydronephrosis. The  Left Kidney:  12.9 cm. No hydronephrosis.  Well-preserved cortex. Normal size and parenchymal echotexture without focal abnormalities.  Bladder:  Incompletely distended, unremarkable with bilateral ureteral jets documented.  Incidental note made of spleen enlarged without focal lesion, 6 x 10.9 x 11.3 cm ( 389.9 cc).  IMPRESSION:  1.  No hydronephrosis. 2.  Echogenic renal parenchyma, a nonspecific indicator of medical renal disease. 3.  Splenomegaly.   Original Report Authenticated By: D. Andria Rhein, MD   US Biopsy  11/11/2012   *RADIOLOGY REPORT*  Clinical history:37 year old female with acute renal insufficiency. The patient has microhematuria and proteinuria.  PROCEDURE(S): ULTRASOUND GUIDED LEFT RENAL BIOPSY  Physician: Rachelle Hora. Henn, MD  Medications:Versed 2 mg, Fentanyl 100 mcg. A radiology nurse monitored the patient for moderate sedation.  Moderate sedation time:25 minutes  Fluoroscopy time: None  Procedure:The procedure was explained to the patient.  The risks and benefits of the procedure were discussed and the patient's questions were addressed.  Informed consent was obtained from the patient.  The patient was placed prone and both kidneys were evaluated with ultrasound.  The left kidney was felt to be most accessible for percutaneous biopsy.  The left lower pole was targeted.  The left flank was prepped and draped in a sterile fashion.  The skin was anesthetized with 1% lidocaine.  A 15 gauge guide needle was directed towards the lower pole cortex.  Two core biopsies were obtained with a 16 gauge device within the lower pole cortex.  No significant bleeding was identified.  A Gelfoam slurry was injected through the 15 gauge guide needle as it was slowly removed.  Findings:Core biopsies obtained from the left kidney lower pole. No significant bleeding following the  procedure.  Complications: None  Impression:Successful ultrasound guided core  biopsies of the left kidney.   Original Report Authenticated By: Richarda Overlie, M.D.   Medications:     . amLODipine  10 mg Oral Daily  . fluconazole (DIFLUCAN) IV  100 mg Intravenous Q24H  . metoprolol tartrate  25 mg Oral BID  . predniSONE  60 mg Oral Q breakfast  . sodium chloride  3 mL Intravenous Q12H  . sodium chloride  3 mL Intravenous Q12H   acetaminophen, acetaminophen, HYDROcodone-acetaminophen, lidocaine, ondansetron (ZOFRAN) IV, ondansetron  Assessment/ Plan:  [name] is a 37 y.o.African American female with a PMHx of HTN, SVT s/p ablation, chronic anemia with heavy menses, and anxiety, who was admitted to [facility] on 11/10/2012 for evaluation of symptomatic anemia, lower extremity edema and rash. Found to have AKI with hematuria, proteinuria, and hypalbuminemia.   AKI--improving. Cr trending down.  S/p renal biopsy 11/11/12 ?SLE. Low c3 and c4.  HIV negative.  ANA positive.  Renal ultrasound: no hydronephrosis, echogenic renal parenchyma, and splenomegaly.  RMSF negative  Recent Labs Lab 11/10/12 0301 11/10/12 1140 11/11/12 0601 11/12/12 0440  CREATININE 2.52* 2.10* 1.88* 1.78*   -renal biopsy 11/11/12 -trend renal function -f/u serology: ANCA panel, total complement pending.   B/L lower extremity rash--mild improvement, started on prednisone 8/14.  Non-blanchable red purpuric petechial rash, please see above discussion including differentials ?SLE, vasculitis, infectious/insect bite, medications, and HSP. Associated with anemia and AKI as well. Platelets steady 170s and have been low in the past down to 140s. She provided consent for photographs to be used for chart documentation and education.     -continue to monitor -f/u echo: moderate concentric hypertrophy, EF 55-60%, mild pulmonary HTN with trivial pericardial effusion -prednisone 60mg  qd  Anemia--Hb on admission 6.8, s/p 2 units PRBC and Hb improved to 9.3. Hx of heavy menses. LMP 1-2 weeks ago. Microscopic hematuria  on u/a. Pelvic ultrasound +fibroids.   Recent Labs Lab 11/10/12 0301 11/10/12 1140 11/11/12 0601  HCT 20.4* 25.4* 28.0*   -trend Hb  Diet: regular DVT PPX - SCDs  Dispo--pending further clinical improvement  Case discussed and patient seen with Dr. Hyman Hopes.  Will see again Monday.   Signed: Darden Palmer, MD PGY-2, Internal Medicine Resident Pager: [phone]  11/12/2012,9:37 AM

## 2012-11-13 LAB — RENAL FUNCTION PANEL
BUN: 38 mg/dL — ABNORMAL HIGH (ref 6–23)
CO2: 18 mEq/L — ABNORMAL LOW (ref 19–32)
Chloride: 106 mEq/L (ref 96–112)
Creatinine, Ser: 1.59 mg/dL — ABNORMAL HIGH (ref 0.50–1.10)
Glucose, Bld: 128 mg/dL — ABNORMAL HIGH (ref 70–99)

## 2012-11-13 MED ORDER — BISACODYL 10 MG RE SUPP: 10.0000 mg | Freq: Once | RECTAL | Status: DC

## 2012-11-13 MED ORDER — POLYETHYLENE GLYCOL 3350 17 G PO PACK
17.0000 g | PACK | Freq: Every day | ORAL | Status: DC
  Administered 2012-11-13 – 2012-11-15 (×3): 17 g via ORAL
  Filled 2012-11-13 (×3): qty 1

## 2012-11-13 NOTE — Progress Notes (Signed)
[facility] PROGRESS NOTE  [name] ZHY:[id number] DOB: 12/13/1975 DOA: 11/10/2012 PCP: Karie Chimera, MD  Assessment/Plan: Rash- Improving, ANA is positive, low c3, C4. Will call rheumatology in am to discuss theb results. Continue with po prednisone 60 mg po daily. AKI- s/p renal biopsy, final result is pending. Hypertension- Has been taking cardizem which can also cause rash and edema , will d/c cardizem and start metoprolol 25 mg po bid. Called and discussed with her cardiologist Dr Jacinto Halim.  H/O SVT- s/p ablation, no new episodes.  Hypertension- Will add Amlodipine 10 mg po daily as BP is elevated. Anemia- has h/o DUB, also could be due to renal cause, will continue with blood transfusion and check cbc in am. Pelvic ultrasound shows fibroid, will need to follow up Gyn as outpatient. DVT Prophylaxis- SCD   Code Status: Full code Family Communication:  Discussed with Mother at bedside Disposition Plan: Home when medically stable   Consultants:  Nephrology  Procedures:  None  Antibiotics:  None  HPI/Subjective: Patient seen and examined, c/o right lower quadrant pain, she had appendectomy in past.  Objective: Filed Vitals:   11/13/12 0933  BP: 127/70  Pulse: 68  Temp:   Resp:     Intake/Output Summary (Last 24 hours) at 11/13/12 1202 Last data filed at 11/13/12 0900  Gross per 24 hour  Intake    960 ml  Output      0 ml  Net    960 ml   Filed Weights   11/10/12 0636 11/11/12 0354 11/13/12 0500  Weight: 78.472 kg (173 lb) 79.606 kg (175 lb 8 oz) 79.969 kg (176 lb 4.8 oz)    Exam:   General:  Appear in no acute distress  Cardiovascular: S1S2 RRR  Respiratory: Clear bilaterally  Abdomen: Soft, positive tenderness in right lower quadrant  Musculoskeletal: No edema  Data Reviewed: Basic Metabolic Panel:  Recent Labs Lab 11/10/12 0301 11/10/12 1140 11/11/12 0601 11/12/12 0440 11/13/12 0410  NA 132* 135 134* 134* 134*  K 4.4 4.2 4.6  4.3 4.1  CL 102 106 107 105 106  CO2 22 20 20 21  18*  GLUCOSE 90 80 122* 98 128*  BUN 24* 21 24* 34* 38*  CREATININE 2.52* 2.10* 1.88* 1.78* 1.59*  CALCIUM 8.2* 8.4 8.6 8.7 8.8  MG  --   --  1.8  --   --   PHOS  --   --  4.4 4.6 4.5   Liver Function Tests:  Recent Labs Lab 11/10/12 0301 11/10/12 1140 11/11/12 0601 11/12/12 0440 11/13/12 0410  AST 105* 85*  --   --   --   ALT 28 20  --   --   --   ALKPHOS 55 54  --   --   --   BILITOT 0.3 0.3  --   --   --   PROT 8.0 7.6  --   --   --   ALBUMIN 2.0* 1.8* 1.8* 1.8* 1.7*   No results found for this basename: LIPASE, AMYLASE,  in the last 168 hours No results found for this basename: AMMONIA,  in the last 168 hours CBC:  Recent Labs Lab 11/10/12 0301 11/10/12 1140 11/11/12 0601  WBC 3.6* 3.3* 3.7*  NEUTROABS 2.6 2.5  --   HGB 6.8* 8.7* 9.3*  HCT 20.4* 25.4* 28.0*  MCV 90.7 88.2 88.6  PLT 196 173 171   Cardiac Enzymes: No results found for this basename: CKTOTAL, CKMB, CKMBINDEX, TROPONINI,  in the  last 168 hours BNP (last 3 results)  Recent Labs  07/22/12 0527  PROBNP 521.2*   CBG: No results found for this basename: GLUCAP,  in the last 168 hours  Recent Results (from the past 240 hour(s))  URINE CULTURE     Status: None   Collection Time    11/10/12  5:36 AM      Result Value Range Status   Specimen Description URINE, RANDOM   Final   Special Requests CX ADDED AT 0621 ON [id number]   Final   Culture  Setup Time     Final   Value: 11/10/2012 10:45     Performed at [facility] Count     Final   Value: NO GROWTH     Performed at [facility]   Culture     Final   Value: NO GROWTH     Performed at [facility]   Report Status 11-14-12 FINAL   Final  CULTURE, BLOOD (ROUTINE X 2)     Status: None   Collection Time    11/10/12 11:40 AM      Result Value Range Status   Specimen Description BLOOD RIGHT ARM   Final   Special Requests BOTTLES DRAWN AEROBIC ONLY 10CC    Final   Culture  Setup Time     Final   Value: 11/10/2012 17:06     Performed at [facility]   Culture     Final   Value:        BLOOD CULTURE RECEIVED NO GROWTH TO DATE CULTURE WILL BE HELD FOR 5 DAYS BEFORE ISSUING A FINAL NEGATIVE REPORT     Performed at [facility]   Report Status PENDING   Incomplete  CULTURE, BLOOD (ROUTINE X 2)     Status: None   Collection Time    11/10/12 11:50 AM      Result Value Range Status   Specimen Description BLOOD LEFT HAND   Final   Special Requests BOTTLES DRAWN AEROBIC ONLY 10CC   Final   Culture  Setup Time     Final   Value: 11/10/2012 17:06     Performed at [facility]   Culture     Final   Value:        BLOOD CULTURE RECEIVED NO GROWTH TO DATE CULTURE WILL BE HELD FOR 5 DAYS BEFORE ISSUING A FINAL NEGATIVE REPORT     Performed at [facility]   Report Status PENDING   Incomplete     Studies: US Transvaginal Non-ob  Nov 14, 2012   *RADIOLOGY REPORT*  Clinical Data: Dysfunctional uterine bleeding with anemia.  TRANSABDOMINAL AND TRANSVAGINAL ULTRASOUND OF PELVIS Technique:  Both transabdominal and transvaginal ultrasound examinations of the pelvis were performed. Transabdominal technique was performed for global imaging of the pelvis including uterus, ovaries, adnexal regions, and pelvic cul-de-sac.  It was necessary to proceed with endovaginal exam following the transabdominal exam to visualize the myometrium, endometrium and adnexa.  Comparison:  CT 2011  Findings:  Uterus: Is anteverted and anteflexed and demonstrates a sagittal length of 8.4 cm, depth of 5.0 cm and width of 5.6 cm.  A focal fibroid is identified in the posterior upper uterine segment measuring 1.6 x 1.7 x 1.7 cm, mural in location.  Mild diffuse heterogeneity is otherwise seen with no other definite fibroids identified.  Endometrium: Has a tri-layered pattern with a width of 6.8 mm.  No areas of focal thickening  or heterogeneity are seen  Right  ovary:  Measures 3.4 x 2.2 x 2.3 cm and contains a dominant follicle  Left ovary: Measures 2.5 x 1.7 x 2.4 cm and has a normal appearance  Other findings: A small amount of simple free fluid is noted in the cul-de-sac extending into the right adnexa  IMPRESSION: Focal fibroid with size and location as noted above.  Mild diffuse uterine heterogeneity may signify more diffuse fibroid involvement or underlying adenomyosis.  Unremarkable endometrium and ovaries.   Original Report Authenticated By: Rhodia Albright, M.D.   US Pelvis Complete  11/11/2012   *RADIOLOGY REPORT*  Clinical Data: Dysfunctional uterine bleeding with anemia.  TRANSABDOMINAL AND TRANSVAGINAL ULTRASOUND OF PELVIS Technique:  Both transabdominal and transvaginal ultrasound examinations of the pelvis were performed. Transabdominal technique was performed for global imaging of the pelvis including uterus, ovaries, adnexal regions, and pelvic cul-de-sac.  It was necessary to proceed with endovaginal exam following the transabdominal exam to visualize the myometrium, endometrium and adnexa.  Comparison:  CT 2011  Findings:  Uterus: Is anteverted and anteflexed and demonstrates a sagittal length of 8.4 cm, depth of 5.0 cm and width of 5.6 cm.  A focal fibroid is identified in the posterior upper uterine segment measuring 1.6 x 1.7 x 1.7 cm, mural in location.  Mild diffuse heterogeneity is otherwise seen with no other definite fibroids identified.  Endometrium: Has a tri-layered pattern with a width of 6.8 mm.  No areas of focal thickening or heterogeneity are seen  Right ovary:  Measures 3.4 x 2.2 x 2.3 cm and contains a dominant follicle  Left ovary: Measures 2.5 x 1.7 x 2.4 cm and has a normal appearance  Other findings: A small amount of simple free fluid is noted in the cul-de-sac extending into the right adnexa  IMPRESSION: Focal fibroid with size and location as noted above.  Mild diffuse uterine heterogeneity may signify more diffuse fibroid  involvement or underlying adenomyosis.  Unremarkable endometrium and ovaries.   Original Report Authenticated By: Rhodia Albright, M.D.   US Biopsy  11/11/2012   *RADIOLOGY REPORT*  Clinical history:37 year old female with acute renal insufficiency. The patient has microhematuria and proteinuria.  PROCEDURE(S): ULTRASOUND GUIDED LEFT RENAL BIOPSY  Physician: Rachelle Hora. Henn, MD  Medications:Versed 2 mg, Fentanyl 100 mcg. A radiology nurse monitored the patient for moderate sedation.  Moderate sedation time:25 minutes  Fluoroscopy time: None  Procedure:The procedure was explained to the patient.  The risks and benefits of the procedure were discussed and the patient's questions were addressed.  Informed consent was obtained from the patient.  The patient was placed prone and both kidneys were evaluated with ultrasound.  The left kidney was felt to be most accessible for percutaneous biopsy.  The left lower pole was targeted.  The left flank was prepped and draped in a sterile fashion.  The skin was anesthetized with 1% lidocaine.  A 15 gauge guide needle was directed towards the lower pole cortex.  Two core biopsies were obtained with a 16 gauge device within the lower pole cortex.  No significant bleeding was identified.  A Gelfoam slurry was injected through the 15 gauge guide needle as it was slowly removed.  Findings:Core biopsies obtained from the left kidney lower pole. No significant bleeding following the procedure.  Complications: None  Impression:Successful ultrasound guided core biopsies of the left kidney.   Original Report Authenticated By: Richarda Overlie, M.D.    Scheduled Meds: . amLODipine  10 mg Oral Daily  .  bisacodyl  10 mg Rectal Once  . fluconazole  100 mg Oral Daily  . metoprolol tartrate  25 mg Oral BID  . polyethylene glycol  17 g Oral Daily  . predniSONE  60 mg Oral Q breakfast  . sodium chloride  3 mL Intravenous Q12H  . sodium chloride  3 mL Intravenous Q12H   Continuous Infusions:    Principal Problem:   Acute renal failure Active Problems:   Anemia   Hypertension   Skin rash   Lower extremity edema    Time spent: 25 min    [facility]  [facility] Pager [phone]. If 7PM-7AM, please contact night-coverage at [email or web address], password [facility] 11/13/2012, 12:02 PM  LOS: 3 days

## 2012-11-14 LAB — ANCA SCREEN W REFLEX TITER
Atypical p-ANCA Screen: NEGATIVE
c-ANCA Screen: NEGATIVE
p-ANCA Screen: POSITIVE — AB

## 2012-11-14 LAB — RENAL FUNCTION PANEL
BUN: 38 mg/dL — ABNORMAL HIGH (ref 6–23)
Chloride: 105 mEq/L (ref 96–112)
GFR calc Af Amer: 53 mL/min — ABNORMAL LOW (ref >90)
Glucose, Bld: 92 mg/dL (ref 70–99)
Potassium: 4.3 mEq/L (ref 3.5–5.1)
Sodium: 133 mEq/L — ABNORMAL LOW (ref 135–145)

## 2012-11-14 LAB — ANCA TITERS

## 2012-11-14 NOTE — Progress Notes (Signed)
[name] ROUNDING NOTE   Subjective:   [name] was seen and examined at bedside this morning.  She reports some swelling in her hands today but her leg swelling is much improved as is her rash.  She denies any CP, SOB, vomiting, diarrhea, nausea, fever, chills, abdominal pain, dysuria or any other urinary complaints at this time.   Objective:  Vital signs in last 24 hours:  Temp:  [98.1 F (36.7 C)-98.8 F (37.1 C)] 98.8 F (37.1 C) (08/18 0519) Pulse Rate:  [61-70] 61 (08/18 0519) Resp:  [18] 18 (08/18 0519) BP: (127-155)/(70-89) 155/75 mmHg (08/18 0519) SpO2:  [100 %] 100 % (08/18 0519)  Weight change:  Filed Weights   11/10/12 0636 11/11/12 0354 11/13/12 0500  Weight: 173 lb (78.472 kg) 175 lb 8 oz (79.606 kg) 176 lb 4.8 oz (79.969 kg)   Intake/Output: I/O last 3 completed shifts: In: 600 [P.O.:600] Out: -    Physical Exam:  Vitals reviewed.  General: sitting up on the side of bed, NAD HEENT: PERRL, EOMI  Cardiac: RRR, SEM  Pulm: clear to auscultation bilaterally, no wheezes, rales, or rhonchi  Abd: soft, obese, nontender, nondistended, BS present  Ext: warm and well perfused, much improved b/l lower extremity edema, +2dp b/l, mild tenderness to palpation of toes on right foot. Scab on dorsal surface of right foot non-draining, non-tender  Neuro: alert and oriented X3, cranial nerves II-XII grossly intact, strength and sensation to light touch equal in bilateral upper and lower extremities  Skin: improved petechial rash on b/l lower extremities and plantar surface of feet  Basic Metabolic Panel:  Recent Labs Lab 11/10/12 1140 11/11/12 0601 11/12/12 0440 11/13/12 0410 11/14/12 0535  NA 135 134* 134* 134* 133*  K 4.2 4.6 4.3 4.1 4.3  CL 106 107 105 106 105  CO2 20 20 21  18* 21  GLUCOSE 80 122* 98 128* 92  BUN 21 24* 34* 38* 38*  CREATININE 2.10* 1.88* 1.78* 1.59* 1.44*  CALCIUM 8.4 8.6 8.7 8.8 9.1  MG  --  1.8  --   --   --   PHOS  --  4.4  4.6 4.5 4.2   Liver Function Tests:  Recent Labs Lab 11/10/12 0301 11/10/12 1140 11/11/12 0601 11/12/12 0440 11/13/12 0410 11/14/12 0535  AST 105* 85*  --   --   --   --   ALT 28 20  --   --   --   --   ALKPHOS 55 54  --   --   --   --   BILITOT 0.3 0.3  --   --   --   --   PROT 8.0 7.6  --   --   --   --   ALBUMIN 2.0* 1.8* 1.8* 1.8* 1.7* 1.8*   CBC:  Recent Labs Lab 11/10/12 0301 11/10/12 1140 11/11/12 0601  WBC 3.6* 3.3* 3.7*  NEUTROABS 2.6 2.5  --   HGB 6.8* 8.7* 9.3*  HCT 20.4* 25.4* 28.0*  MCV 90.7 88.2 88.6  PLT 196 173 171   Microbiology: Results for orders placed during the hospital encounter of 11/10/12  URINE CULTURE     Status: None   Collection Time    11/10/12  5:36 AM      Result Value Range Status   Specimen Description URINE, RANDOM   Final   Special Requests CX ADDED AT 1610 ON [id number]   Final   Culture  Setup Time  Final   Value: 11/10/2012 10:45     Performed at [facility] Count     Final   Value: NO GROWTH     Performed at [facility]   Culture     Final   Value: NO GROWTH     Performed at [facility]   Report Status 11/11/2012 FINAL   Final  CULTURE, BLOOD (ROUTINE X 2)     Status: None   Collection Time    11/10/12 11:40 AM      Result Value Range Status   Specimen Description BLOOD RIGHT ARM   Final   Special Requests BOTTLES DRAWN AEROBIC ONLY 10CC   Final   Culture  Setup Time     Final   Value: 11/10/2012 17:06     Performed at [facility]   Culture     Final   Value:        BLOOD CULTURE RECEIVED NO GROWTH TO DATE CULTURE WILL BE HELD FOR 5 DAYS BEFORE ISSUING A FINAL NEGATIVE REPORT     Performed at [facility]   Report Status PENDING   Incomplete  CULTURE, BLOOD (ROUTINE X 2)     Status: None   Collection Time    11/10/12 11:50 AM      Result Value Range Status   Specimen Description BLOOD LEFT HAND   Final   Special Requests BOTTLES DRAWN AEROBIC ONLY 10CC    Final   Culture  Setup Time     Final   Value: 11/10/2012 17:06     Performed at [facility]   Culture     Final   Value:        BLOOD CULTURE RECEIVED NO GROWTH TO DATE CULTURE WILL BE HELD FOR 5 DAYS BEFORE ISSUING A FINAL NEGATIVE REPORT     Performed at [facility]   Report Status PENDING   Incomplete    Medications:     . amLODipine  10 mg Oral Daily  . bisacodyl  10 mg Rectal Once  . fluconazole  100 mg Oral Daily  . metoprolol tartrate  25 mg Oral BID  . polyethylene glycol  17 g Oral Daily  . predniSONE  60 mg Oral Q breakfast  . sodium chloride  3 mL Intravenous Q12H  . sodium chloride  3 mL Intravenous Q12H   acetaminophen, acetaminophen, HYDROcodone-acetaminophen, lidocaine, ondansetron (ZOFRAN) IV, ondansetron  Assessment/ Plan:  [name] is a 37 y.o.African American female with a PMHx of HTN, SVT s/p ablation, chronic anemia with heavy menses, and anxiety, who was admitted to [facility] on 11/10/2012 for evaluation of symptomatic anemia, lower extremity edema and rash. Found to have AKI with hematuria, proteinuria, and hypalbuminemia.   AKI--improving. Cr trending down.  S/p renal biopsy 11/11/12 ?SLE. Low c3 and c4 and low total CH50, negative myeloperoxidase Abs and serine protease 3 (6 and 2 respectively).  HIV negative.  ANA positive.  Renal ultrasound: no hydronephrosis, echogenic renal parenchyma, and splenomegaly.  RMSF negative   Recent Labs Lab 11/10/12 1140 11/11/12 0601 11/12/12 0440 11/13/12 0410 11/14/12 0535  CREATININE 2.10* 1.88* 1.78* 1.59* 1.44*   -renal biopsy 11/11/12--awaiting results -trend renal function -f/u serology: ANCA panel  B/L lower extremity rash--improving. Started on prednisone 8/14.  ?SLE related.  Associated with anemia and AKI as well. Platelets steady 170s and have been low in the past down to 140s.  f/u echo: moderate concentric hypertrophy, EF  55-60%, mild pulmonary HTN with trivial pericardial  effusion -continue prednisone for now  Anemia--Hb on admission 6.8, s/p 2 units PRBC and Hb improved to 9.3. Hx of heavy menses. LMP 1-2 weeks ago. Microscopic hematuria on u/a. Pelvic ultrasound +fibroids.   Recent Labs Lab 11/10/12 0301 11/10/12 1140 11/11/12 0601  HCT 20.4* 25.4* 28.0*   -trend Hb  Diet: regular  DVT PPX - SCDs  Dispo--pending further clinical improvement  Case discussed and patient seen with Dr. Arrie Aran  Signed: Darden Palmer, MD PGY-2, Internal Medicine Resident Pager: [phone]  11/14/2012,8:35 AM

## 2012-11-14 NOTE — Progress Notes (Signed)
I have seen and examined this patient and agree with plan as outlined by Dr. Virgina Organ.  Pt with new diagnosis of SLE and proteinuria as well as petechial lesions. S/p renal biopsy and will likely get preliminary tomorrow.  Cont with current therapy and should be able follow up with Dr. Lowell Guitar as an outpt.  Will also need referral to Rheumatology.  will order 24 hour urine for protein to quantify. Necole Minassian A,MD 11/14/2012 10:36 AM

## 2012-11-14 NOTE — Progress Notes (Signed)
[facility] PROGRESS NOTE  [name] NWG:[id number] DOB: 03/04/1976 DOA: 11/10/2012 PCP: Karie Chimera, MD  Assessment/Plan: Rash- Improving, ANA is positive, low c3, C4. Will call rheumatology in am to discuss theb results. Continue with po prednisone 60 mg po daily. AKI- s/p renal biopsy, final result is pending. Hypertension- Has been taking cardizem which can also cause rash and edema , will d/c cardizem and start metoprolol 25 mg po bid. Called and discussed with her cardiologist Dr Jacinto Halim.  H/O SVT- s/p ablation, no new episodes.  Hypertension- Will add Amlodipine 10 mg po daily as BP is elevated. Anemia- has h/o DUB, also could be due to renal cause, will continue with blood transfusion and check cbc in am. Pelvic ultrasound shows fibroid, will need to follow up Gyn as outpatient. DVT Prophylaxis- SCD   Code Status: Full code Family Communication:  Discussed with Mother at bedside Disposition Plan: Home when medically stable   Consultants:  Nephrology  Procedures:  None  Antibiotics:  None  HPI/Subjective: Patient seen and examined, no new complaints, awaiting the renal biopsy results.  Objective: Filed Vitals:   11/14/12 0519  BP: 155/75  Pulse: 61  Temp: 98.8 F (37.1 C)  Resp: 18    Intake/Output Summary (Last 24 hours) at 11/14/12 1416 Last data filed at 11/14/12 1200  Gross per 24 hour  Intake    360 ml  Output      0 ml  Net    360 ml   Filed Weights   11/10/12 0636 11/11/12 0354 11/13/12 0500  Weight: 78.472 kg (173 lb) 79.606 kg (175 lb 8 oz) 79.969 kg (176 lb 4.8 oz)    Exam:   General:  Appear in no acute distress  Cardiovascular: S1S2 RRR  Respiratory: Clear bilaterally  Abdomen: Soft, positive tenderness in right lower quadrant  Musculoskeletal: No edema  Data Reviewed: Basic Metabolic Panel:  Recent Labs Lab 11/10/12 1140 11/11/12 0601 11/12/12 0440 11/13/12 0410 11/14/12 0535  NA 135 134* 134* 134* 133*  K  4.2 4.6 4.3 4.1 4.3  CL 106 107 105 106 105  CO2 20 20 21  18* 21  GLUCOSE 80 122* 98 128* 92  BUN 21 24* 34* 38* 38*  CREATININE 2.10* 1.88* 1.78* 1.59* 1.44*  CALCIUM 8.4 8.6 8.7 8.8 9.1  MG  --  1.8  --   --   --   PHOS  --  4.4 4.6 4.5 4.2   Liver Function Tests:  Recent Labs Lab 11/10/12 0301 11/10/12 1140 11/11/12 0601 11/12/12 0440 11/13/12 0410 11/14/12 0535  AST 105* 85*  --   --   --   --   ALT 28 20  --   --   --   --   ALKPHOS 55 54  --   --   --   --   BILITOT 0.3 0.3  --   --   --   --   PROT 8.0 7.6  --   --   --   --   ALBUMIN 2.0* 1.8* 1.8* 1.8* 1.7* 1.8*   No results found for this basename: LIPASE, AMYLASE,  in the last 168 hours No results found for this basename: AMMONIA,  in the last 168 hours CBC:  Recent Labs Lab 11/10/12 0301 11/10/12 1140 11/11/12 0601  WBC 3.6* 3.3* 3.7*  NEUTROABS 2.6 2.5  --   HGB 6.8* 8.7* 9.3*  HCT 20.4* 25.4* 28.0*  MCV 90.7 88.2 88.6  PLT 196 173 171  Cardiac Enzymes: No results found for this basename: CKTOTAL, CKMB, CKMBINDEX, TROPONINI,  in the last 168 hours BNP (last 3 results)  Recent Labs  07/22/12 0527  PROBNP 521.2*   CBG: No results found for this basename: GLUCAP,  in the last 168 hours  Recent Results (from the past 240 hour(s))  URINE CULTURE     Status: None   Collection Time    11/10/12  5:36 AM      Result Value Range Status   Specimen Description URINE, RANDOM   Final   Special Requests CX ADDED AT 0621 ON [id number]   Final   Culture  Setup Time     Final   Value: 11/10/2012 10:45     Performed at [facility] Count     Final   Value: NO GROWTH     Performed at [facility]   Culture     Final   Value: NO GROWTH     Performed at [facility]   Report Status 11/11/2012 FINAL   Final  CULTURE, BLOOD (ROUTINE X 2)     Status: None   Collection Time    11/10/12 11:40 AM      Result Value Range Status   Specimen Description BLOOD RIGHT ARM   Final    Special Requests BOTTLES DRAWN AEROBIC ONLY 10CC   Final   Culture  Setup Time     Final   Value: 11/10/2012 17:06     Performed at [facility]   Culture     Final   Value:        BLOOD CULTURE RECEIVED NO GROWTH TO DATE CULTURE WILL BE HELD FOR 5 DAYS BEFORE ISSUING A FINAL NEGATIVE REPORT     Performed at [facility]   Report Status PENDING   Incomplete  CULTURE, BLOOD (ROUTINE X 2)     Status: None   Collection Time    11/10/12 11:50 AM      Result Value Range Status   Specimen Description BLOOD LEFT HAND   Final   Special Requests BOTTLES DRAWN AEROBIC ONLY 10CC   Final   Culture  Setup Time     Final   Value: 11/10/2012 17:06     Performed at [facility]   Culture     Final   Value:        BLOOD CULTURE RECEIVED NO GROWTH TO DATE CULTURE WILL BE HELD FOR 5 DAYS BEFORE ISSUING A FINAL NEGATIVE REPORT     Performed at [facility]   Report Status PENDING   Incomplete     Studies: No results found.  Scheduled Meds: . amLODipine  10 mg Oral Daily  . bisacodyl  10 mg Rectal Once  . fluconazole  100 mg Oral Daily  . metoprolol tartrate  25 mg Oral BID  . polyethylene glycol  17 g Oral Daily  . predniSONE  60 mg Oral Q breakfast  . sodium chloride  3 mL Intravenous Q12H  . sodium chloride  3 mL Intravenous Q12H   Continuous Infusions:   Principal Problem:   Acute renal failure Active Problems:   Anemia   Hypertension   Skin rash   Lower extremity edema    Time spent: 25 min    [facility]  [facility] Pager [phone]. If 7PM-7AM, please contact night-coverage at [email or web address], password [facility] 11/14/2012, 2:16 PM  LOS: 4 days

## 2012-11-15 DIAGNOSIS — E876 Hypokalemia: Secondary | ICD-10-CM

## 2012-11-15 LAB — RENAL FUNCTION PANEL
BUN: 41 mg/dL — ABNORMAL HIGH (ref 6–23)
CO2: 22 mEq/L (ref 19–32)
GFR calc Af Amer: 50 mL/min — ABNORMAL LOW (ref >90)
Glucose, Bld: 87 mg/dL (ref 70–99)
Phosphorus: 4.2 mg/dL (ref 2.3–4.6)
Potassium: 4.4 mEq/L (ref 3.5–5.1)

## 2012-11-15 LAB — PROTEIN, URINE, 24 HOUR
Collection Interval-UPROT: 24 hours
Protein, 24H Urine: 11172 mg/d — ABNORMAL HIGH (ref 50–100)
Urine Total Volume-UPROT: 1900 mL

## 2012-11-15 MED ORDER — FLUCONAZOLE 100 MG PO TABS: 100.0000 mg | ORAL_TABLET | Freq: Every day | ORAL | Status: DC

## 2012-11-15 MED ORDER — PREDNISONE 20 MG PO TABS: 60.0000 mg | ORAL_TABLET | Freq: Every day | ORAL | Status: DC

## 2012-11-15 MED ORDER — HYDROCODONE-ACETAMINOPHEN 5-325 MG PO TABS: 1.0000 | ORAL_TABLET | ORAL | Status: DC | PRN

## 2012-11-15 MED ORDER — AMLODIPINE BESYLATE 10 MG PO TABS: 10.0000 mg | ORAL_TABLET | Freq: Every day | ORAL | Status: DC

## 2012-11-15 MED ORDER — METOPROLOL TARTRATE 25 MG PO TABS: 25.0000 mg | ORAL_TABLET | Freq: Two times a day (BID) | ORAL | Status: DC

## 2012-11-15 NOTE — Care Management Note (Signed)
    Page 1 of 1   11/15/2012     10:55:04 AM   CARE MANAGEMENT NOTE 11/15/2012  Patient:  [name],[name]   Account Number:  [id number]  Date Initiated:  11/15/2012  Documentation initiated by:  GRAVES-BIGELOW,Kellyn Mccary  Subjective/Objective Assessment:   Pt admitted for Lower extremity edema and skin rash that has now resolved. Work up revealed acute renal failure. Pt is from home with 2 children. Pt has PCP and is able to afford her medications.     Action/Plan:   No needs from CM at this time.   Anticipated DC Date:  11/17/2012   Anticipated DC Plan:  HOME/SELF CARE      DC Planning Services  CM consult      Choice offered to / List presented to:             Status of service:  Completed, signed off Medicare Important Message given?   (If response is "NO", the following Medicare IM given date fields will be blank) Date Medicare IM given:   Date Additional Medicare IM given:    Discharge Disposition:  HOME/SELF CARE  Per UR Regulation:  Reviewed for med. necessity/level of care/duration of stay  If discussed at Long Length of Stay Meetings, dates discussed:    Comments:

## 2012-11-15 NOTE — Progress Notes (Signed)
I have seen and examined this patient and agree with plan as outlined by Dr. Virgina Organ.  Pt with AKI, rash, alopecia, +ANA, +P-ANCA improving on steroids.  S/p renal biopsy and results still pending.  Stable for discharge on prednisone 60mg  daily and f/u with Dr. Lowell Guitar in 3 weeks (he will contact pt with renal biopsy results and any change in therapy once finalized). [name] A,MD 11/15/2012 12:26 PM

## 2012-11-15 NOTE — Progress Notes (Signed)
[facility] ROUNDING NOTE   Subjective:   [name] was seen and examined at bedside this morning.  She has b/l wrist soreness today but rash and lower extremity swelling has resolved.  She endorses not drinking much water yesterday but has a good appetite. She denies any CP, SOB, vomiting, diarrhea, nausea, fever, chills, abdominal pain, dysuria or any other urinary complaints at this time.   Objective:  Vital signs in last 24 hours:  Temp:  [98.1 F (36.7 C)-98.5 F (36.9 C)] 98.2 F (36.8 C) (08/19 0530) Pulse Rate:  [65-74] 69 (08/19 0610) Resp:  [18] 18 (08/19 0530) BP: (144-168)/(73-94) 154/76 mmHg (08/19 0610) SpO2:  [100 %] 100 % (08/19 0530)  Weight change:  Filed Weights   11/10/12 0636 11/11/12 0354 11/13/12 0500  Weight: 173 lb (78.472 kg) 175 lb 8 oz (79.606 kg) 176 lb 4.8 oz (79.969 kg)   Intake/Output: I/O last 3 completed shifts: In: 720 [P.O.:720] Out: 1100 [Urine:1100]   Physical Exam:  Vitals reviewed.  General: sitting up on the side of bed, NAD HEENT: PERRL, EOMI  Cardiac: RRR, SEM  Pulm: clear to auscultation bilaterally, no wheezes, rales, or rhonchi  Abd: soft, obese, nontender, nondistended, BS present  Ext: warm and well perfused, improved lower extremity edema and rash.  Scab on dorsal surface of right foot non-draining, non-tender  Neuro: alert and oriented X3, cranial nerves II-XII grossly intact, unable to make full wrists due to soreness, strength 3/5 b/l hands compared to 5/5 other extremities.  Sensation intact.   Skin: resolved rash on lower extremities  Basic Metabolic Panel:  Recent Labs Lab 11/11/12 0601 11/12/12 0440 11/13/12 0410 11/14/12 0535 11/15/12 0610  NA 134* 134* 134* 133* 134*  K 4.6 4.3 4.1 4.3 4.4  CL 107 105 106 105 105  CO2 20 21 18* 21 22  GLUCOSE 122* 98 128* 92 87  BUN 24* 34* 38* 38* 41*  CREATININE 1.88* 1.78* 1.59* 1.44* 1.51*  CALCIUM 8.6 8.7 8.8 9.1 9.1  MG 1.8  --   --   --   --   PHOS  4.4 4.6 4.5 4.2 4.2   Liver Function Tests:  Recent Labs Lab 11/10/12 0301 11/10/12 1140 11/11/12 0601 11/12/12 0440 11/13/12 0410 11/14/12 0535 11/15/12 0610  AST 105* 85*  --   --   --   --   --   ALT 28 20  --   --   --   --   --   ALKPHOS 55 54  --   --   --   --   --   BILITOT 0.3 0.3  --   --   --   --   --   PROT 8.0 7.6  --   --   --   --   --   ALBUMIN 2.0* 1.8* 1.8* 1.8* 1.7* 1.8* 1.8*   CBC:  Recent Labs Lab 11/10/12 0301 11/10/12 1140 11/11/12 0601  WBC 3.6* 3.3* 3.7*  NEUTROABS 2.6 2.5  --   HGB 6.8* 8.7* 9.3*  HCT 20.4* 25.4* 28.0*  MCV 90.7 88.2 88.6  PLT 196 173 171   Microbiology: Results for orders placed during the hospital encounter of 11/10/12  URINE CULTURE     Status: None   Collection Time    11/10/12  5:36 AM      Result Value Range Status   Specimen Description URINE, RANDOM   Final   Special Requests CX ADDED AT 0621  ON [id number]   Final   Culture  Setup Time     Final   Value: 11/10/2012 10:45     Performed at [facility] Count     Final   Value: NO GROWTH     Performed at [facility]   Culture     Final   Value: NO GROWTH     Performed at [facility]   Report Status 11/11/2012 FINAL   Final  CULTURE, BLOOD (ROUTINE X 2)     Status: None   Collection Time    11/10/12 11:40 AM      Result Value Range Status   Specimen Description BLOOD RIGHT ARM   Final   Special Requests BOTTLES DRAWN AEROBIC ONLY 10CC   Final   Culture  Setup Time     Final   Value: 11/10/2012 17:06     Performed at [facility]   Culture     Final   Value:        BLOOD CULTURE RECEIVED NO GROWTH TO DATE CULTURE WILL BE HELD FOR 5 DAYS BEFORE ISSUING A FINAL NEGATIVE REPORT     Performed at [facility]   Report Status PENDING   Incomplete  CULTURE, BLOOD (ROUTINE X 2)     Status: None   Collection Time    11/10/12 11:50 AM      Result Value Range Status   Specimen Description BLOOD LEFT HAND   Final    Special Requests BOTTLES DRAWN AEROBIC ONLY 10CC   Final   Culture  Setup Time     Final   Value: 11/10/2012 17:06     Performed at [facility]   Culture     Final   Value:        BLOOD CULTURE RECEIVED NO GROWTH TO DATE CULTURE WILL BE HELD FOR 5 DAYS BEFORE ISSUING A FINAL NEGATIVE REPORT     Performed at [facility]   Report Status PENDING   Incomplete    Medications:     . amLODipine  10 mg Oral Daily  . bisacodyl  10 mg Rectal Once  . fluconazole  100 mg Oral Daily  . metoprolol tartrate  25 mg Oral BID  . polyethylene glycol  17 g Oral Daily  . predniSONE  60 mg Oral Q breakfast  . sodium chloride  3 mL Intravenous Q12H  . sodium chloride  3 mL Intravenous Q12H   acetaminophen, acetaminophen, HYDROcodone-acetaminophen, lidocaine, ondansetron (ZOFRAN) IV, ondansetron  Assessment/ Plan:  [name] is a 37 y.o.African American female with a PMHx of HTN, SVT s/p ablation, chronic anemia with heavy menses, and anxiety, who was admitted to [facility] on 11/10/2012 for evaluation of symptomatic anemia, lower extremity edema and rash. Found to have AKI with hematuria, proteinuria, and hypalbuminemia.   AKI--Cr slightly increased today.  S/p renal biopsy 11/11/12 ?SLE. Low c3 and c4 and low total CH50, negative myeloperoxidase Abs and serine protease 3 (6 and 2 respectively).  HIV negative.  ANA positive.  Renal ultrasound: no hydronephrosis, echogenic renal parenchyma, and splenomegaly.  RMSF negative   Recent Labs Lab 11/11/12 0601 11/12/12 0440 11/13/12 0410 11/14/12 0535 11/15/12 0610  CREATININE 1.88* 1.78* 1.59* 1.44* 1.51*   -renal biopsy 11/11/12--awaiting results, likely prelim read today -trend renal function -f/u serology: ANCA panel: pANCA positive  B/L lower extremity rash--improving. Started on prednisone 8/14.  ?SLE related.  Associated with anemia and AKI as  well. Platelets steady 170s and have been low in the past down to 140s.  f/u echo: moderate  concentric hypertrophy, EF 55-60%, mild pulmonary HTN with trivial pericardial effusion -continue prednisone for now  Anemia--Hb on admission 6.8, s/p 2 units PRBC and Hb improved to 9.3. Hx of heavy menses. LMP 1-2 weeks ago. Microscopic hematuria on u/a. Pelvic ultrasound +fibroids.   Recent Labs Lab 11/10/12 0301 11/10/12 1140 11/11/12 0601  HCT 20.4* 25.4* 28.0*   -trend Hb  Diet: regular  DVT PPX - SCDs  Dispo--pending further clinical improvement  Case discussed and patient seen with Dr. Arrie Aran  Signed: Darden Palmer, MD PGY-2, Internal Medicine Resident Pager: [phone]  11/15/2012,8:18 AM

## 2012-11-15 NOTE — Discharge Summary (Signed)
Physician Discharge Summary  [name] ZOX:[id number] DOB: 1975/12/18 DOA: 11/10/2012  PCP: Karie Chimera, MD  Admit date: 11/10/2012 Discharge date: 11/15/2012  Time spent: *50 minutes  Recommendations for Outpatient Follow-up:  1. Follow up Nephrology in 2 weeks  Discharge Diagnoses:  Principal Problem:   Acute renal failure Active Problems:   Anemia   Hypertension   Skin rash   Lower extremity edema   Discharge Condition: stable  Diet recommendation: Low salt diet  Filed Weights   11/10/12 0636 11/11/12 0354 11/13/12 0500  Weight: 78.472 kg (173 lb) 79.606 kg (175 lb 8 oz) 79.969 kg (176 lb 4.8 oz)    History of present illness:  37 y.o. female with history of SVT status post ablation, chronic anemia and hypertension presented to the ER because of lower extremity edema and rash in the lower extremities. Patient states that her lower extremity edema started off yesterday and along with it the rash appeared in the lower extremities. Patient states that she had similar incidents last month and month before when edema and rash appears and is also a few days. She often on get subjective feeling of fever and chills. Denies any use of any new medications. Last week she fell that she may have had an insect bite but not sure. Patient denies any chest pain or shortness of breath nausea vomiting abdominal pain diarrhea. In the ER patient's labs show anemia with increased creatinine for baseline. Patient has lower extremity petechial rashes which are nonblanching. The rash does extend from the feet to the thighs of both lower extremities. Patient does not have any obvious joint swellings but does have lower extremity edema which is nontender. Patient has been using ibuprofen very often for last few months because of joint pains and muscle aches. Patient has been admitted for further management.   Hospital Course:  ? SLE- Patient came with rash and autoimmune workup was ordered, ANA was p  anca  screen is positive, patient also had renal biopsy done the results are pending at this time. She will need outpatient followup with rheumatology to confirm the diagnosis of SLE. Patient has been started on high-dose prednisone as per nephrology and will be discharged on 60 mg of prednisone. Nephrology will followup with patient as outpatient  Acute kidney injury- patient came with acute kidney injury, ibuprofen was discontinued. At this time creatinine has somewhat improved, renal biopsy done the results are pending at this time. Patient will need to follow up with nephrology as outpatient  Hypertension-blood pressure is controlled she was started on amlodipine and metoprolol. Cardizem was discontinued due to possible association with rash.  Anemia-patient has dysfunctional uterine bleeding, pelvic ultrasound showed fibroid. She will need GYN followup as outpatient. Hemoglobin is stable at this time. Last improvement as of 11/11/2012 is 9.3.    Procedures:  Renal biopsy  Consultations:  Nephrology  Discharge Exam: Filed Vitals:   11/15/12 0610  BP: 154/76  Pulse: 69  Temp:   Resp:     General: Appears in no acute distress Cardiovascular: S1-S2 regular Respiratory: Clear bilaterally Extremities: No edema  Discharge Instructions  Discharge Orders   Future Appointments Provider Department Dept Phone   12/30/2012 8:15 AM Freddi Starr, PA-C [facility] [phone]   Future Orders Complete By Expires   Diet - low sodium heart healthy  As directed    Increase activity slowly  As directed        Medication List    STOP taking these  medications       diltiazem 120 MG 24 hr tablet  Commonly known as:  CARDIZEM LA     ibuprofen 200 MG tablet  Commonly known as:  ADVIL,MOTRIN      TAKE these medications       amLODipine 10 MG tablet  Commonly known as:  NORVASC  Take 1 tablet (10 mg total) by mouth daily.     fluconazole 100 MG tablet  Commonly  known as:  DIFLUCAN  Take 1 tablet (100 mg total) by mouth daily.     HYDROcodone-acetaminophen 5-325 MG per tablet  Commonly known as:  NORCO/VICODIN  Take 1-2 tablets by mouth every 4 (four) hours as needed.     metoprolol tartrate 25 MG tablet  Commonly known as:  LOPRESSOR  Take 1 tablet (25 mg total) by mouth 2 (two) times daily.     predniSONE 20 MG tablet  Commonly known as:  DELTASONE  Take 3 tablets (60 mg total) by mouth daily with breakfast.       Allergies  Allergen Reactions  . Other     Red peppers - swelling      The results of significant diagnostics from this hospitalization (including imaging, microbiology, ancillary and laboratory) are listed below for reference.    Significant Diagnostic Studies: US Transvaginal Non-ob  11-22-12   *RADIOLOGY REPORT*  Clinical Data: Dysfunctional uterine bleeding with anemia.  TRANSABDOMINAL AND TRANSVAGINAL ULTRASOUND OF PELVIS Technique:  Both transabdominal and transvaginal ultrasound examinations of the pelvis were performed. Transabdominal technique was performed for global imaging of the pelvis including uterus, ovaries, adnexal regions, and pelvic cul-de-sac.  It was necessary to proceed with endovaginal exam following the transabdominal exam to visualize the myometrium, endometrium and adnexa.  Comparison:  CT 2011  Findings:  Uterus: Is anteverted and anteflexed and demonstrates a sagittal length of 8.4 cm, depth of 5.0 cm and width of 5.6 cm.  A focal fibroid is identified in the posterior upper uterine segment measuring 1.6 x 1.7 x 1.7 cm, mural in location.  Mild diffuse heterogeneity is otherwise seen with no other definite fibroids identified.  Endometrium: Has a tri-layered pattern with a width of 6.8 mm.  No areas of focal thickening or heterogeneity are seen  Right ovary:  Measures 3.4 x 2.2 x 2.3 cm and contains a dominant follicle  Left ovary: Measures 2.5 x 1.7 x 2.4 cm and has a normal appearance  Other findings:  A small amount of simple free fluid is noted in the cul-de-sac extending into the right adnexa  IMPRESSION: Focal fibroid with size and location as noted above.  Mild diffuse uterine heterogeneity may signify more diffuse fibroid involvement or underlying adenomyosis.  Unremarkable endometrium and ovaries.   Original Report Authenticated By: Rhodia Albright, M.D.   US Pelvis Complete  11/22/12   *RADIOLOGY REPORT*  Clinical Data: Dysfunctional uterine bleeding with anemia.  TRANSABDOMINAL AND TRANSVAGINAL ULTRASOUND OF PELVIS Technique:  Both transabdominal and transvaginal ultrasound examinations of the pelvis were performed. Transabdominal technique was performed for global imaging of the pelvis including uterus, ovaries, adnexal regions, and pelvic cul-de-sac.  It was necessary to proceed with endovaginal exam following the transabdominal exam to visualize the myometrium, endometrium and adnexa.  Comparison:  CT 2011  Findings:  Uterus: Is anteverted and anteflexed and demonstrates a sagittal length of 8.4 cm, depth of 5.0 cm and width of 5.6 cm.  A focal fibroid is identified in the posterior upper uterine segment measuring 1.6 x  1.7 x 1.7 cm, mural in location.  Mild diffuse heterogeneity is otherwise seen with no other definite fibroids identified.  Endometrium: Has a tri-layered pattern with a width of 6.8 mm.  No areas of focal thickening or heterogeneity are seen  Right ovary:  Measures 3.4 x 2.2 x 2.3 cm and contains a dominant follicle  Left ovary: Measures 2.5 x 1.7 x 2.4 cm and has a normal appearance  Other findings: A small amount of simple free fluid is noted in the cul-de-sac extending into the right adnexa  IMPRESSION: Focal fibroid with size and location as noted above.  Mild diffuse uterine heterogeneity may signify more diffuse fibroid involvement or underlying adenomyosis.  Unremarkable endometrium and ovaries.   Original Report Authenticated By: Rhodia Albright, M.D.   US  Renal  11/10/2012   *RADIOLOGY REPORT*  Clinical Data: Acute renal failure, hypertension  RENAL/URINARY TRACT ULTRASOUND COMPLETE  Comparison:  CT 09/08/2009  Findings:  Right Kidney:  12.6 cm in length.  Parenchyma is slightly echogenic compared to the adjacent liver.  No focal lesion or hydronephrosis. The  Left Kidney:  12.9 cm. No hydronephrosis.  Well-preserved cortex. Normal size and parenchymal echotexture without focal abnormalities.  Bladder:  Incompletely distended, unremarkable with bilateral ureteral jets documented.  Incidental note made of spleen enlarged without focal lesion, 6 x 10.9 x 11.3 cm ( 389.9 cc).  IMPRESSION:  1.  No hydronephrosis. 2.  Echogenic renal parenchyma, a nonspecific indicator of medical renal disease. 3.  Splenomegaly.   Original Report Authenticated By: D. Andria Rhein, MD   US Biopsy  11/11/2012   *RADIOLOGY REPORT*  Clinical history:37 year old female with acute renal insufficiency. The patient has microhematuria and proteinuria.  PROCEDURE(S): ULTRASOUND GUIDED LEFT RENAL BIOPSY  Physician: Rachelle Hora. Henn, MD  Medications:Versed 2 mg, Fentanyl 100 mcg. A radiology nurse monitored the patient for moderate sedation.  Moderate sedation time:25 minutes  Fluoroscopy time: None  Procedure:The procedure was explained to the patient.  The risks and benefits of the procedure were discussed and the patient's questions were addressed.  Informed consent was obtained from the patient.  The patient was placed prone and both kidneys were evaluated with ultrasound.  The left kidney was felt to be most accessible for percutaneous biopsy.  The left lower pole was targeted.  The left flank was prepped and draped in a sterile fashion.  The skin was anesthetized with 1% lidocaine.  A 15 gauge guide needle was directed towards the lower pole cortex.  Two core biopsies were obtained with a 16 gauge device within the lower pole cortex.  No significant bleeding was identified.  A Gelfoam slurry was  injected through the 15 gauge guide needle as it was slowly removed.  Findings:Core biopsies obtained from the left kidney lower pole. No significant bleeding following the procedure.  Complications: None  Impression:Successful ultrasound guided core biopsies of the left kidney.   Original Report Authenticated By: Richarda Overlie, M.D.   Dg Chest Port 1 View  11/10/2012   *RADIOLOGY REPORT*  Clinical Data: Rule out congestion  PORTABLE CHEST - 1 VIEW  Comparison: Chest x-ray and CT PE study 07/21/2012  Findings: Single frontal view of the chest demonstrates stable enlargement of the cardiopericardial silhouette.  There is mild pulmonary vascular congestion but no overt edema.  Overall, the degree of vascular congestion is less than seen in April 2014.  No focal airspace consolidation, pleural effusion or pneumothorax.  No acute osseous abnormality.  IMPRESSION:  1.  Mild  pulmonary vascular congestion without overt edema. Improved compared to 07/21/2012.  2.  Stable enlargement of the cardiopericardial silhouette likely reflecting a combination of cardiomegaly and pericardial fluid.   Original Report Authenticated By: Malachy Moan, M.D.    Microbiology: Recent Results (from the past 240 hour(s))  URINE CULTURE     Status: None   Collection Time    11/10/12  5:36 AM      Result Value Range Status   Specimen Description URINE, RANDOM   Final   Special Requests CX ADDED AT 0621 ON [id number]   Final   Culture  Setup Time     Final   Value: 11/10/2012 10:45     Performed at [facility]   Colony Count     Final   Value: NO GROWTH     Performed at [facility]   Culture     Final   Value: NO GROWTH     Performed at [facility]   Report Status 11/11/2012 FINAL   Final  CULTURE, BLOOD (ROUTINE X 2)     Status: None   Collection Time    11/10/12 11:40 AM      Result Value Range Status   Specimen Description BLOOD RIGHT ARM   Final   Special Requests BOTTLES DRAWN AEROBIC ONLY  10CC   Final   Culture  Setup Time     Final   Value: 11/10/2012 17:06     Performed at [facility]   Culture     Final   Value:        BLOOD CULTURE RECEIVED NO GROWTH TO DATE CULTURE WILL BE HELD FOR 5 DAYS BEFORE ISSUING A FINAL NEGATIVE REPORT     Performed at [facility]   Report Status PENDING   Incomplete  CULTURE, BLOOD (ROUTINE X 2)     Status: None   Collection Time    11/10/12 11:50 AM      Result Value Range Status   Specimen Description BLOOD LEFT HAND   Final   Special Requests BOTTLES DRAWN AEROBIC ONLY 10CC   Final   Culture  Setup Time     Final   Value: 11/10/2012 17:06     Performed at [facility]   Culture     Final   Value:        BLOOD CULTURE RECEIVED NO GROWTH TO DATE CULTURE WILL BE HELD FOR 5 DAYS BEFORE ISSUING A FINAL NEGATIVE REPORT     Performed at [facility]   Report Status PENDING   Incomplete     Labs: Basic Metabolic Panel:  Recent Labs Lab 11/11/12 0601 11/12/12 0440 11/13/12 0410 11/14/12 0535 11/15/12 0610  NA 134* 134* 134* 133* 134*  K 4.6 4.3 4.1 4.3 4.4  CL 107 105 106 105 105  CO2 20 21 18* 21 22  GLUCOSE 122* 98 128* 92 87  BUN 24* 34* 38* 38* 41*  CREATININE 1.88* 1.78* 1.59* 1.44* 1.51*  CALCIUM 8.6 8.7 8.8 9.1 9.1  MG 1.8  --   --   --   --   PHOS 4.4 4.6 4.5 4.2 4.2   Liver Function Tests:  Recent Labs Lab 11/10/12 0301 11/10/12 1140 11/11/12 0601 11/12/12 0440 11/13/12 0410 11/14/12 0535 11/15/12 0610  AST 105* 85*  --   --   --   --   --   ALT 28 20  --   --   --   --   --  ALKPHOS 55 54  --   --   --   --   --   BILITOT 0.3 0.3  --   --   --   --   --   PROT 8.0 7.6  --   --   --   --   --   ALBUMIN 2.0* 1.8* 1.8* 1.8* 1.7* 1.8* 1.8*   No results found for this basename: LIPASE, AMYLASE,  in the last 168 hours No results found for this basename: AMMONIA,  in the last 168 hours CBC:  Recent Labs Lab 11/10/12 0301 11/10/12 1140 11/11/12 0601  WBC 3.6* 3.3* 3.7*   NEUTROABS 2.6 2.5  --   HGB 6.8* 8.7* 9.3*  HCT 20.4* 25.4* 28.0*  MCV 90.7 88.2 88.6  PLT 196 173 171   Cardiac Enzymes: No results found for this basename: CKTOTAL, CKMB, CKMBINDEX, TROPONINI,  in the last 168 hours BNP: BNP (last 3 results)  Recent Labs  07/22/12 0527  PROBNP 521.2*   CBG: No results found for this basename: GLUCAP,  in the last 168 hours     Signed:  LAMA,GAGAN S  [facility] 11/15/2012, 12:26 PM

## 2012-11-16 LAB — CULTURE, BLOOD (ROUTINE X 2): Culture: NO GROWTH

## 2012-12-30 ENCOUNTER — Encounter: Payer: Self-pay | Admitting: Medical

## 2012-12-30 ENCOUNTER — Ambulatory Visit (INDEPENDENT_AMBULATORY_CARE_PROVIDER_SITE_OTHER): Payer: Medicaid Other | Admitting: Medical

## 2012-12-30 ENCOUNTER — Encounter: Payer: Medicaid Other | Admitting: Medical

## 2012-12-30 VITALS — BP 144/93 | HR 85 | Temp 97.8°F | Ht 62.5 in | Wt 190.0 lb

## 2012-12-30 DIAGNOSIS — N926 Irregular menstruation, unspecified: Secondary | ICD-10-CM

## 2012-12-30 DIAGNOSIS — Z01419 Encounter for gynecological examination (general) (routine) without abnormal findings: Secondary | ICD-10-CM

## 2012-12-30 DIAGNOSIS — N939 Abnormal uterine and vaginal bleeding, unspecified: Secondary | ICD-10-CM

## 2012-12-30 LAB — CBC
HCT: 24.9 % — ABNORMAL LOW (ref 36.0–46.0)
Hemoglobin: 8.3 g/dL — ABNORMAL LOW (ref 12.0–15.0)
MCH: 32.3 pg (ref 26.0–34.0)
MCHC: 33.3 g/dL (ref 30.0–36.0)
MCV: 96.9 fL (ref 78.0–100.0)
Platelets: 481 K/uL — ABNORMAL HIGH (ref 150–400)
RBC: 2.57 MIL/uL — ABNORMAL LOW (ref 3.87–5.11)
RDW: 16.6 % — ABNORMAL HIGH (ref 11.5–15.5)
WBC: 10.9 K/uL — ABNORMAL HIGH (ref 4.0–10.5)

## 2012-12-30 NOTE — Progress Notes (Signed)
Patient ID: [name], female   DOB: [birth year], 37 y.o.   MRN: [id number] Subjective:    [name] is a 37 y.o. female who presents for an annual exam. The patient states ~ 1 year history of irregular periods. Patient was in [facility] in August and was diagnosed with AKD, Lupus, edema and anemia. Testing was performed to look for a source of bleeding as cause for anemia and the patient was told that she had uterine fibroids. The patient is not currently sexually active. GYN screening history: last pap: approximate date 07/2011 and was normal. The patient wears seatbelts: yes. The patient participates in regular exercise: yes. Has the patient ever been transfused or tattooed?: yes. The patient reports that there is not domestic violence in her life.   Menstrual History: OB History   Grav Para Term Preterm Abortions TAB SAB Ect Mult Living   2 2 2  0 0 0 0 0 0 2      Menarche age: 37 years old  Patient's last menstrual period was 12/22/2012.    The following portions of the patient's history were reviewed and updated as appropriate: allergies, current medications, past family history, past medical history, past social history, past surgical history and problem list.  Review of Systems Pertinent items are noted in HPI.    Objective:     BP 144/93  Pulse 85  Temp(Src) 97.8 F (36.6 C) (Oral)  Ht 5' 2.5" (1.588 m)  Wt 190 lb (86.183 kg)  BMI 34.18 kg/m2  LMP 12/22/2012 GENERAL: Well-developed, well-nourished female in no acute distress.  HEENT: Normocephalic, atraumatic. Sclerae anicteric.  NECK: Supple. Normal thyroid.  LUNGS: Clear to auscultation bilaterally.  HEART: Regular rate and rhythm. BREASTS: Symmetric in size. No masses, skin changes, nipple drainage, or lymphadenopathy. ABDOMEN: Soft, nontender, nondistended. No organomegaly. PELVIC: Normal external female genitalia. Vagina is pink and rugated.  Normal discharge. Normal cervix contour. Uterus feels enlarged on bimanual  exam. Exam is limited due to patient's body habitus. No adnexal mass or tenderness.  EXTREMITIES: No cyanosis, clubbing, or edema.  .    Assessment:    Healthy female exam.  Abnormal uterine bleeding Fibroids   Plan:     1. Next pap smear will be due in 2016. Patient will return in 1 year for annual exam  2. CBC today 3. Patient to return to [facility] clinic in ~ 4 weeks for endometrial biopsy and discussion of management of AUB

## 2012-12-30 NOTE — Patient Instructions (Addendum)
Uterine Fibroid A uterine fibroid is a growth (tumor) that occurs in a woman's uterus. This type of tumor is not cancerous and does not spread out of the uterus. A woman can have one or many fibroids, and the fiboid(s) can become quite large. A fibroid can vary in size, weight, and where it grows in the uterus. Most fibroids do not require medical treatment, but some can cause pain or heavy bleeding during and between periods. CAUSES  A fibroid is the result of a single uterine cell that keeps growing (unregulated), which is different than most cells in the human body. Most cells have a control mechanism that keeps them from reproducing without control.  SYMPTOMS   Bleeding.  Pelvic pain and pressure.  Bladder problems due to the size of the fibroid.  Infertility and miscarriages depending on the size and location of the fibroid. DIAGNOSIS  A diagnosis is made by physical exam. Your caregiver may feel the lumpy tumors during a pelvic exam. Important information regarding size, location, and number of tumors can be gained by having an ultrasound. It is rare that other tests, such as a CT scan or MRI, are needed. TREATMENT   Your caregiver may recommend watchful waiting. This involves getting the fibroid checked by your caregiver to see if the fibroids grow or shrink.   Hormonal treatment or an intrauterine device (IUD) may be prescribed.   Surgery may be needed to remove the fibroids (myomectomy) or the uterus (hysterectomy). This depends on your situation. When fibroids interfere with fertility and a woman wants to become pregnant, a caregiver may recommend having the fibroids removed.  HOME CARE INSTRUCTIONS  Home care depends on how you were treated. In general:   Keep all follow-up appointments with your caregiver.   Only take medicine as told by your caregiver. Do not take aspirin. It can cause bleeding.   If you have excessive periods and soak tampons or pads in a half hour or  less, contact your caregiver immediately. If your periods are troublesome but not so heavy, lie down with your feet raised slightly above your heart. Place cold packs on your lower abdomen.   If your periods are heavy, write down the number of pads or tampons you use per month. Bring this information to your caregiver.   Talk to your caregiver about taking iron pills.   Include green vegetables in your diet.   If you were prescribed a hormonal treatment, take the hormonal medicines as directed.   If you need surgery, ask your caregiver for information on your specific surgery.  SEEK IMMEDIATE MEDICAL CARE IF:  You have pelvic pain or cramps not controlled with medicines.   You have a sudden increase in pelvic pain.   You have an increase of bleeding between and during periods.   You feel lightheaded or have fainting episodes.  MAKE SURE YOU:  Understand these instructions.  Will watch your condition.  Will get help right away if you are not doing well or get worse. Document Released: 03/13/2000 Document Revised: 06/08/2011 Document Reviewed: 04/06/2011 [facility] Patient Information 2014 [place], [place]. Endometrial Biopsy This is a test in which a tissue sample (a biopsy) is taken from inside the uterus (womb). It is then looked at by a specialist under a microscope to see if the tissue is normal or abnormal. The endometrium is the lining of the uterus. This test helps determine where you are in your menstrual cycle and how hormone levels are affecting the  lining of the uterus. Another use for this test is to diagnose endometrial cancer, tuberculosis, polyps, or inflammatory conditions and to evaluate uterine bleeding. PREPARATION FOR TEST No preparation or fasting is necessary. NORMAL FINDINGS No pathologic conditions. Presence of "secretory-type" endometrium 3 to 5 days before to normal menstruation. Ranges for normal findings may vary among different laboratories and  hospitals. You should always check with your doctor after having lab work or other tests done to discuss the meaning of your test results and whether your values are considered within normal limits. MEANING OF TEST  Your caregiver will go over the test results with you and discuss the importance and meaning of your results, as well as treatment options and the need for additional tests if necessary. OBTAINING THE TEST RESULTS It is your responsibility to obtain your test results. Ask the lab or department performing the test when and how you will get your results. Document Released: 07/17/2004 Document Revised: 06/08/2011 Document Reviewed: 02/24/2008 [facility] Patient Information 2014 [place], [place].

## 2013-01-03 ENCOUNTER — Telehealth: Payer: Self-pay | Admitting: *Deleted

## 2013-01-03 NOTE — Telephone Encounter (Addendum)
Message copied by Jill Side on Tue Jan 03, 2013 11:14 AM ------      Message from: Freddi Starr      Created: Fri Dec 30, 2012  6:22 PM       Please call patient and inform her that her Iron is still low. Not so low that we need to do a transfusion today. She should call her PCP to follow-up for the anemia because of all of her chronic conditions and since they recently took her off or iron and I don't know why. Thanks! ------ Called pt and informed her of test result and recommendation that she contact her PCP for further advice concerning her iron level. Pt voiced understanding and replied that she will call her doctor.

## 2013-01-25 ENCOUNTER — Other Ambulatory Visit: Payer: Self-pay

## 2013-01-25 ENCOUNTER — Encounter (HOSPITAL_COMMUNITY): Payer: Self-pay | Admitting: Emergency Medicine

## 2013-01-25 ENCOUNTER — Emergency Department (HOSPITAL_COMMUNITY)
Admission: EM | Admit: 2013-01-25 | Discharge: 2013-01-25 | Disposition: A | Discharge status: Home / Self Care | Payer: Medicaid Other | Attending: Emergency Medicine | Admitting: Emergency Medicine

## 2013-01-25 ENCOUNTER — Emergency Department (HOSPITAL_COMMUNITY): Payer: Medicaid Other

## 2013-01-25 DIAGNOSIS — Z3202 Encounter for pregnancy test, result negative: Secondary | ICD-10-CM | POA: Insufficient documentation

## 2013-01-25 DIAGNOSIS — Z862 Personal history of diseases of the blood and blood-forming organs and certain disorders involving the immune mechanism: Secondary | ICD-10-CM | POA: Insufficient documentation

## 2013-01-25 DIAGNOSIS — I10 Essential (primary) hypertension: Secondary | ICD-10-CM | POA: Insufficient documentation

## 2013-01-25 DIAGNOSIS — R42 Dizziness and giddiness: Secondary | ICD-10-CM | POA: Insufficient documentation

## 2013-01-25 DIAGNOSIS — F411 Generalized anxiety disorder: Secondary | ICD-10-CM | POA: Insufficient documentation

## 2013-01-25 DIAGNOSIS — R809 Proteinuria, unspecified: Secondary | ICD-10-CM | POA: Insufficient documentation

## 2013-01-25 DIAGNOSIS — R1013 Epigastric pain: Secondary | ICD-10-CM

## 2013-01-25 DIAGNOSIS — Z79899 Other long term (current) drug therapy: Secondary | ICD-10-CM | POA: Insufficient documentation

## 2013-01-25 DIAGNOSIS — R55 Syncope and collapse: Secondary | ICD-10-CM | POA: Insufficient documentation

## 2013-01-25 DIAGNOSIS — R011 Cardiac murmur, unspecified: Secondary | ICD-10-CM | POA: Insufficient documentation

## 2013-01-25 DIAGNOSIS — R51 Headache: Secondary | ICD-10-CM | POA: Insufficient documentation

## 2013-01-25 DIAGNOSIS — IMO0002 Reserved for concepts with insufficient information to code with codable children: Secondary | ICD-10-CM | POA: Insufficient documentation

## 2013-01-25 DIAGNOSIS — Z8739 Personal history of other diseases of the musculoskeletal system and connective tissue: Secondary | ICD-10-CM | POA: Insufficient documentation

## 2013-01-25 LAB — CBC WITH DIFFERENTIAL/PLATELET
Basophils Relative: 0 % (ref 0–1)
Eosinophils Absolute: 0 10*3/uL (ref 0.0–0.7)
Eosinophils Relative: 0 % (ref 0–5)
HCT: 34.2 % — ABNORMAL LOW (ref 36.0–46.0)
Hemoglobin: 10.7 g/dL — ABNORMAL LOW (ref 12.0–15.0)
Lymphs Abs: 1.6 10*3/uL (ref 0.7–4.0)
MCH: 31.2 pg (ref 26.0–34.0)
MCHC: 31.3 g/dL (ref 30.0–36.0)
MCV: 99.7 fL (ref 78.0–100.0)
Monocytes Absolute: 0.6 10*3/uL (ref 0.1–1.0)
Monocytes Relative: 5 % (ref 3–12)
Neutrophils Relative %: 82 % — ABNORMAL HIGH (ref 43–77)
RBC: 3.43 MIL/uL — ABNORMAL LOW (ref 3.87–5.11)

## 2013-01-25 LAB — URINALYSIS, ROUTINE W REFLEX MICROSCOPIC
Bilirubin Urine: NEGATIVE
Nitrite: NEGATIVE
Specific Gravity, Urine: 1.019 (ref 1.005–1.030)
Urobilinogen, UA: 0.2 mg/dL (ref 0.0–1.0)
pH: 5 (ref 5.0–8.0)

## 2013-01-25 LAB — COMPREHENSIVE METABOLIC PANEL
Albumin: 3 g/dL — ABNORMAL LOW (ref 3.5–5.2)
Alkaline Phosphatase: 47 U/L (ref 39–117)
BUN: 30 mg/dL — ABNORMAL HIGH (ref 6–23)
Calcium: 9.4 mg/dL (ref 8.4–10.5)
Creatinine, Ser: 1.47 mg/dL — ABNORMAL HIGH (ref 0.50–1.10)
GFR calc Af Amer: 52 mL/min — ABNORMAL LOW (ref >90)
Glucose, Bld: 80 mg/dL (ref 70–99)
Potassium: 3.4 mEq/L — ABNORMAL LOW (ref 3.5–5.1)
Total Protein: 7.4 g/dL (ref 6.0–8.3)

## 2013-01-25 LAB — URINE MICROSCOPIC-ADD ON

## 2013-01-25 LAB — POCT I-STAT TROPONIN I: Troponin i, poc: 0.05 ng/mL (ref 0.00–0.08)

## 2013-01-25 LAB — LIPASE, BLOOD: Lipase: 94 U/L — ABNORMAL HIGH (ref 11–59)

## 2013-01-25 MED ORDER — GI COCKTAIL ~~LOC~~
30.0000 mL | Freq: Once | ORAL | Status: AC
  Administered 2013-01-25: 30 mL via ORAL
  Filled 2013-01-25: qty 30

## 2013-01-25 MED ORDER — TRAMADOL HCL 50 MG PO TABS: 50.0000 mg | ORAL_TABLET | Freq: Four times a day (QID) | ORAL | Status: DC | PRN

## 2013-01-25 MED ORDER — FAMOTIDINE IN NACL 20-0.9 MG/50ML-% IV SOLN
20.0000 mg | Freq: Once | INTRAVENOUS | Status: AC
  Administered 2013-01-25: 20 mg via INTRAVENOUS
  Filled 2013-01-25: qty 50

## 2013-01-25 MED ORDER — DIPHENHYDRAMINE HCL 50 MG/ML IJ SOLN
25.0000 mg | Freq: Once | INTRAMUSCULAR | Status: AC
  Administered 2013-01-25: 25 mg via INTRAVENOUS
  Filled 2013-01-25: qty 1

## 2013-01-25 MED ORDER — FAMOTIDINE 40 MG PO TABS: 20.0000 mg | ORAL_TABLET | Freq: Every day | ORAL | Status: DC

## 2013-01-25 MED ORDER — SODIUM CHLORIDE 0.9 % IV BOLUS (SEPSIS)
1000.0000 mL | Freq: Once | INTRAVENOUS | Status: AC
  Administered 2013-01-25: 1000 mL via INTRAVENOUS

## 2013-01-25 MED ORDER — PROCHLORPERAZINE EDISYLATE 5 MG/ML IJ SOLN
10.0000 mg | Freq: Once | INTRAMUSCULAR | Status: AC
  Administered 2013-01-25: 10 mg via INTRAVENOUS
  Filled 2013-01-25: qty 2

## 2013-01-25 MED ORDER — PROMETHAZINE HCL 25 MG PO TABS: 25.0000 mg | ORAL_TABLET | Freq: Four times a day (QID) | ORAL | Status: DC | PRN

## 2013-01-25 NOTE — ED Notes (Signed)
MD at Bedside.
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
## 2013-01-26 LAB — URINE CULTURE

## 2013-01-26 NOTE — ED Provider Notes (Signed)
Medical screening examination/treatment/procedure(s) were performed by non-physician practitioner and as supervising physician I was immediately available for consultation/collaboration.    Vida Roller, MD 01/26/13 [phone]

## 2013-02-02 ENCOUNTER — Other Ambulatory Visit: Payer: Self-pay

## 2013-02-10 ENCOUNTER — Encounter: Payer: Self-pay | Admitting: Obstetrics & Gynecology

## 2013-02-10 ENCOUNTER — Encounter: Payer: Self-pay | Admitting: *Deleted

## 2013-02-10 ENCOUNTER — Ambulatory Visit (INDEPENDENT_AMBULATORY_CARE_PROVIDER_SITE_OTHER): Payer: Medicaid Other | Admitting: Obstetrics & Gynecology

## 2013-02-10 VITALS — BP 140/86 | HR 107 | Ht 64.0 in | Wt 192.3 lb

## 2013-02-10 DIAGNOSIS — N920 Excessive and frequent menstruation with regular cycle: Secondary | ICD-10-CM

## 2013-02-10 LAB — POCT PREGNANCY, URINE: Preg Test, Ur: NEGATIVE

## 2013-02-10 MED ORDER — MEDROXYPROGESTERONE ACETATE 104 MG/0.65ML ~~LOC~~ SUSP
104.0000 mg | Freq: Once | SUBCUTANEOUS | Status: AC
  Administered 2013-02-10: 104 mg via SUBCUTANEOUS

## 2013-02-10 MED ORDER — MEDROXYPROGESTERONE ACETATE 150 MG/ML IM SUSP: 150.0000 mg | INTRAMUSCULAR | Status: DC

## 2013-02-10 NOTE — Progress Notes (Signed)
Subjective:     Patient ID: [name], female   DOB: 06-Aug-1975, 37 y.o.   MRN: [id number]  HPI Pt presents for eval of heavy uterine bleeding. She reports a h/o heavy bleeding PRIOR to her dx of renal disease.  She reports that she has a endo bx at [facility] last Spring that was normal.  She reports that her cycles are intermittently heavy and light but that they are always assoc with clots.   Had GYN exam earlier this month.   Review of Systems     Objective:   Physical Exam BP 140/86  Pulse 107  Ht 5\' 4"  (1.626 m)  Wt 192 lb 4.8 oz (87.227 kg)  BMI 32.99 kg/m2  LMP 01/24/2013 Pt in NAD  Exam deferred   11/11/2012 Clinical Data: Dysfunctional uterine bleeding with anemia.  TRANSABDOMINAL AND TRANSVAGINAL ULTRASOUND OF PELVIS  Technique: Both transabdominal and transvaginal ultrasound  examinations of the pelvis were performed. Transabdominal technique  was performed for global imaging of the pelvis including uterus,  ovaries, adnexal regions, and pelvic cul-de-sac.  It was necessary to proceed with endovaginal exam following the  transabdominal exam to visualize the myometrium, endometrium and  adnexa.  Comparison: CT 2011  Findings:  Uterus: Is anteverted and anteflexed and demonstrates a sagittal  length of 8.4 cm, depth of 5.0 cm and width of 5.6 cm. A focal  fibroid is identified in the posterior upper uterine segment  measuring 1.6 x 1.7 x 1.7 cm, mural in location. Mild diffuse  heterogeneity is otherwise seen with no other definite fibroids  identified.  Endometrium: Has a tri-layered pattern with a width of 6.8 mm. No  areas of focal thickening or heterogeneity are seen  Right ovary: Measures 3.4 x 2.2 x 2.3 cm and contains a dominant  follicle  Left ovary: Measures 2.5 x 1.7 x 2.4 cm and has a normal appearance  Other findings: A small amount of simple free fluid is noted in the  cul-de-sac extending into the right adnexa  IMPRESSION:  Focal  fibroid with size and location as noted above. Mild diffuse  uterine heterogeneity may signify more diffuse fibroid involvement  or underlying adenomyosis.  Unremarkable endometrium and ovaries.      Assessment:     DUB- prev controlled with Depo Provera- pt wants to restrart Depo.  Will consider Mirena in the future. Endo bx NOT done  Today as pt recently has one and her endometrila stripe was ~14mm     Plan:     Depo Provera 150mg  IM today F/u in 3 months or sooner prn Records from [facility] (endo bx)

## 2013-02-10 NOTE — Addendum Note (Signed)
Addended by: Sherre Lain A on: 02/10/2013 10:53 AM   Modules accepted: Orders

## 2013-02-10 NOTE — Patient Instructions (Addendum)
Levonorgestrel intrauterine device (IUD) What is this medicine? LEVONORGESTREL IUD (LEE voe nor jes trel) is a contraceptive (birth control) device. The device is placed inside the uterus by a healthcare professional. It is used to prevent pregnancy and can also be used to treat heavy bleeding that occurs during your period. Depending on the device, it can be used for 3 to 5 years. This medicine may be used for other purposes; ask your health care provider or pharmacist if you have questions. COMMON BRAND NAME(S): Mirena, Skyla What should I tell my health care provider before I take this medicine? They need to know if you have any of these conditions: -abnormal Pap smear -cancer of the breast, uterus, or cervix -diabetes -endometritis -genital or pelvic infection now or in the past -have more than one sexual partner or your partner has more than one partner -heart disease -history of an ectopic or tubal pregnancy -immune system problems -IUD in place -liver disease or tumor -problems with blood clots or take blood-thinners -use intravenous drugs -uterus of unusual shape -vaginal bleeding that has not been explained -an unusual or allergic reaction to levonorgestrel, other hormones, silicone, or polyethylene, medicines, foods, dyes, or preservatives -pregnant or trying to get pregnant -breast-feeding How should I use this medicine? This device is placed inside the uterus by a health care professional. Talk to your pediatrician regarding the use of this medicine in children. Special care may be needed. Overdosage: If you think you have taken too much of this medicine contact a poison control center or emergency room at once. NOTE: This medicine is only for you. Do not share this medicine with others. What if I miss a dose? This does not apply. What may interact with this medicine? Do not take this medicine with any of the following  medications: -amprenavir -bosentan -fosamprenavir This medicine may also interact with the following medications: -aprepitant -barbiturate medicines for inducing sleep or treating seizures -bexarotene -griseofulvin -medicines to treat seizures like carbamazepine, ethotoin, felbamate, oxcarbazepine, phenytoin, topiramate -modafinil -pioglitazone -rifabutin -rifampin -rifapentine -some medicines to treat HIV infection like atazanavir, indinavir, lopinavir, nelfinavir, tipranavir, ritonavir -St. John's wort -warfarin This list may not describe all possible interactions. Give your health care provider a list of all the medicines, herbs, non-prescription drugs, or dietary supplements you use. Also tell them if you smoke, drink alcohol, or use illegal drugs. Some items may interact with your medicine. What should I watch for while using this medicine? Visit your doctor or health care professional for regular check ups. See your doctor if you or your partner has sexual contact with others, becomes HIV positive, or gets a sexual transmitted disease. This product does not protect you against HIV infection (AIDS) or other sexually transmitted diseases. You can check the placement of the IUD yourself by reaching up to the top of your vagina with clean fingers to feel the threads. Do not pull on the threads. It is a good habit to check placement after each menstrual period. Call your doctor right away if you feel more of the IUD than just the threads or if you cannot feel the threads at all. The IUD may come out by itself. You may become pregnant if the device comes out. If you notice that the IUD has come out use a backup birth control method like condoms and call your health care provider. Using tampons will not change the position of the IUD and are okay to use during your period. What side effects may I   notice from receiving this medicine? Side effects that you should report to your doctor or  health care professional as soon as possible: -allergic reactions like skin rash, itching or hives, swelling of the face, lips, or tongue -fever, flu-like symptoms -genital sores -high blood pressure -no menstrual period for 6 weeks during use -pain, swelling, warmth in the leg -pelvic pain or tenderness -severe or sudden headache -signs of pregnancy -stomach cramping -sudden shortness of breath -trouble with balance, talking, or walking -unusual vaginal bleeding, discharge -yellowing of the eyes or skin Side effects that usually do not require medical attention (report to your doctor or health care professional if they continue or are bothersome): -acne -breast pain -change in sex drive or performance -changes in weight -cramping, dizziness, or faintness while the device is being inserted -headache -irregular menstrual bleeding within first 3 to 6 months of use -nausea This list may not describe all possible side effects. Call your doctor for medical advice about side effects. You may report side effects to FDA at 1-800-FDA-1088. Where should I keep my medicine? This does not apply. NOTE: This sheet is a summary. It may not cover all possible information. If you have questions about this medicine, talk to your doctor, pharmacist, or health care provider.  2014, Elsevier/Gold Standard. (2011-04-16 13:54:04) Uterine Fibroid A uterine fibroid is a growth (tumor) that occurs in a woman's uterus. This type of tumor is not cancerous and does not spread out of the uterus. A woman can have one or many fibroids, and the fiboid(s) can become quite large. A fibroid can vary in size, weight, and where it grows in the uterus. Most fibroids do not require medical treatment, but some can cause pain or heavy bleeding during and between periods. CAUSES  A fibroid is the result of a single uterine cell that keeps growing (unregulated), which is different than most cells in the human body. Most cells  have a control mechanism that keeps them from reproducing without control.  SYMPTOMS   Bleeding.  Pelvic pain and pressure.  Bladder problems due to the size of the fibroid.  Infertility and miscarriages depending on the size and location of the fibroid. DIAGNOSIS  A diagnosis is made by physical exam. Your caregiver may feel the lumpy tumors during a pelvic exam. Important information regarding size, location, and number of tumors can be gained by having an ultrasound. It is rare that other tests, such as a CT scan or MRI, are needed. TREATMENT   Your caregiver may recommend watchful waiting. This involves getting the fibroid checked by your caregiver to see if the fibroids grow or shrink.   Hormonal treatment or an intrauterine device (IUD) may be prescribed.   Surgery may be needed to remove the fibroids (myomectomy) or the uterus (hysterectomy). This depends on your situation. When fibroids interfere with fertility and a woman wants to become pregnant, a caregiver may recommend having the fibroids removed.  HOME CARE INSTRUCTIONS  Home care depends on how you were treated. In general:   Keep all follow-up appointments with your caregiver.   Only take medicine as told by your caregiver. Do not take aspirin. It can cause bleeding.   If you have excessive periods and soak tampons or pads in a half hour or less, contact your caregiver immediately. If your periods are troublesome but not so heavy, lie down with your feet raised slightly above your heart. Place cold packs on your lower abdomen.   If your periods are heavy,  write down the number of pads or tampons you use per month. Bring this information to your caregiver.   Talk to your caregiver about taking iron pills.   Include green vegetables in your diet.   If you were prescribed a hormonal treatment, take the hormonal medicines as directed.   If you need surgery, ask your caregiver for information on your specific  surgery.  SEEK IMMEDIATE MEDICAL CARE IF:  You have pelvic pain or cramps not controlled with medicines.   You have a sudden increase in pelvic pain.   You have an increase of bleeding between and during periods.   You feel lightheaded or have fainting episodes.  MAKE SURE YOU:  Understand these instructions.  Will watch your condition.  Will get help right away if you are not doing well or get worse. Document Released: 03/13/2000 Document Revised: 06/08/2011 Document Reviewed: 10/13/2012 [facility] Patient Information 2014 [place], [place]. Dysfunctional Uterine Bleeding Normally, menstrual periods begin between ages 92 to 39 in young women. A normal menstrual cycle/period may begin every 23 days up to 35 days and lasts from 1 to 7 days. Around 12 to 14 days before your menstrual period starts, ovulation (ovary produces an egg) occurs. When counting the time between menstrual periods, count from the first day of bleeding of the previous period to the first day of bleeding of the next period. Dysfunctional (abnormal) uterine bleeding is bleeding that is different from a normal menstrual period. Your periods may come earlier or later than usual. They may be lighter, have blood clots or be heavier. You may have bleeding between periods, or you may skip one period or more. You may have bleeding after sexual intercourse, bleeding after menopause, or no menstrual period. CAUSES   Pregnancy (normal, miscarriage, tubal).  IUDs (intrauterine device, birth control).  Birth control pills.  Hormone treatment.  Menopause.  Infection of the cervix.  Blood clotting problems.  Infection of the inside lining of the uterus.  Endometriosis, inside lining of the uterus growing in the pelvis and other female organs.  Adhesions (scar tissue) inside the uterus.  Obesity or severe weight loss.  Uterine polyps inside the uterus.  Cancer of the vagina, cervix, or uterus.  Ovarian cysts or  polycystic ovary syndrome.  Medical problems (diabetes, thyroid disease).  Uterine fibroids (noncancerous tumor).  Problems with your female hormones.  Endometrial hyperplasia, very thick lining and enlarged cells inside of the uterus.  Medicines that interfere with ovulation.  Radiation to the pelvis or abdomen.  Chemotherapy. DIAGNOSIS   Your doctor will discuss the history of your menstrual periods, medicines you are taking, changes in your weight, stress in your life, and any medical problems you may have.  Your doctor will do a physical and pelvic examination.  Your doctor may want to perform certain tests to make a diagnosis, such as:  Pap test.  Blood tests.  Cultures for infection.  CT scan.  Ultrasound.  Hysteroscopy.  Laparoscopy.  MRI.  Hysterosalpingography.  D and C.  Endometrial biopsy. TREATMENT  Treatment will depend on the cause of the dysfunctional uterine bleeding (DUB). Treatment may include:  Observing your menstrual periods for a couple of months.  Prescribing medicines for medical problems, including:  Antibiotics.  Hormones.  Birth control pills.  Removing an IUD (intrauterine device, birth control).  Surgery:  D and C (scrape and remove tissue from inside the uterus).  Laparoscopy (examine inside the abdomen with a lighted tube).  Uterine ablation (destroy lining of  the uterus with electrical current, laser, heat, or freezing).  Hysteroscopy (examine cervix and uterus with a lighted tube).  Hysterectomy (remove the uterus). HOME CARE INSTRUCTIONS   If medicines were prescribed, take exactly as directed. Do not change or switch medicines without consulting your caregiver.  Long term heavy bleeding may result in iron deficiency. Your caregiver may have prescribed iron pills. They help replace the iron that your body lost from heavy bleeding. Take exactly as directed.  Do not take aspirin or medicines that contain  aspirin one week before or during your menstrual period. Aspirin may make the bleeding worse.  If you need to change your sanitary pad or tampon more than once every 2 hours, stay in bed with your feet elevated and a cold pack on your lower abdomen. Rest as much as possible, until the bleeding stops or slows down.  Eat well-balanced meals. Eat foods high in iron. Examples are:  Leafy green vegetables.  Whole-grain breads and cereals.  Eggs.  Meat.  Liver.  Do not try to lose weight until the abnormal bleeding has stopped and your blood iron level is back to normal. Do not lift more than ten pounds or do strenuous activities when you are bleeding.  For a couple of months, make note on your calendar, marking the start and ending of your period, and the type of bleeding (light, medium, heavy, spotting, clots or missed periods). This is for your caregiver to better evaluate your problem. SEEK MEDICAL CARE IF:   You develop nausea (feeling sick to your stomach) and vomiting, dizziness, or diarrhea while you are taking your medicine.  You are getting lightheaded or weak.  You have any problems that may be related to the medicine you are taking.  You develop pain with your DUB.  You want to remove your IUD.  You want to stop or change your birth control pills or hormones.  You have any type of abnormal bleeding mentioned above.  You are over 32 years old and have not had a menstrual period yet.  You are 37 years old and you are still having menstrual periods.  You have any of the symptoms mentioned above.  You develop a rash. SEEK IMMEDIATE MEDICAL CARE IF:   An oral temperature above 102 F (38.9 C) develops.  You develop chills.  You are changing your sanitary pad or tampon more than once an hour.  You develop abdominal pain.  You pass out or faint. Document Released: 03/13/2000 Document Revised: 06/08/2011 Document Reviewed: 02/12/2009 [facility] Patient Information  2014 [place], [place].

## 2013-02-15 ENCOUNTER — Encounter: Payer: Self-pay | Admitting: *Deleted

## 2013-03-07 ENCOUNTER — Encounter (HOSPITAL_COMMUNITY): Payer: Self-pay

## 2013-03-31 ENCOUNTER — Emergency Department (HOSPITAL_COMMUNITY)
Admission: EM | Admit: 2013-03-31 | Discharge: 2013-03-31 | Disposition: A | Discharge status: Home / Self Care | Payer: Medicaid Other | Source: Home / Self Care

## 2013-03-31 ENCOUNTER — Encounter (HOSPITAL_COMMUNITY): Payer: Self-pay | Admitting: Emergency Medicine

## 2013-03-31 DIAGNOSIS — L03019 Cellulitis of unspecified finger: Secondary | ICD-10-CM

## 2013-03-31 DIAGNOSIS — L03011 Cellulitis of right finger: Secondary | ICD-10-CM

## 2013-03-31 MED ORDER — SULFAMETHOXAZOLE-TRIMETHOPRIM 800-160 MG PO TABS: 1.0000 | ORAL_TABLET | Freq: Two times a day (BID) | ORAL | Status: DC

## 2013-03-31 NOTE — Discharge Instructions (Signed)
Soak twice a day for 5 days in warm water, take all of medicine, return if any problems. °

## 2013-03-31 NOTE — ED Notes (Signed)
Infected    r middle  Finger  Pain   Redness   Swelling  Pus  Pocket  Seen       Symptoms  X  4  Days

## 2013-03-31 NOTE — ED Provider Notes (Signed)
CSN: [id number]     Arrival date & time 03/31/13  1014 History   None    Chief Complaint  Patient presents with  . Hand Pain   (Consider location/radiation/quality/duration/timing/severity/associated sxs/prior Treatment) Patient is a 38 y.o. female presenting with hand pain. The history is provided by the patient.  Hand Pain This is a new problem. The current episode started more than 2 days ago (erythema of finger then grad pus developedin rmf cuticle.). The problem has been gradually worsening.    Past Medical History  Diagnosis Date  . Hypertension   . Cardiac arrhythmia   . SVT (supraventricular tachycardia)     "today, last week, 2 wk ago; maybe 1 month ago, etc; started w/in last 3-4 yrs"(07/20/2012)  . Fainting     "~ 1 month ago; probably related to SVT" (07/20/2012)  . Heart murmur     "small" (07/20/2012)  . Shortness of breath     "related to SVT episodes" (07/20/2012)  . Anemia     "today" (07/20/2012)  . Chest pain at rest     "related to SVT" (07/20/2012)  . Sleep apnea     "don't wear mask" (07/20/2012)  . Daily headache   . Anxiety   . Lupus (systemic lupus erythematosus)    Past Surgical History  Procedure Laterality Date  . Appendectomy  2011  . Tubal ligation  2010  . Cesarean section  2001; 2010  . Cervical biopsy  2013  . Ablation      07/21/12 Dr. Cristopher Peru    Family History  Problem Relation Age of Onset  . Cancer Mother     breast and ovarian  . BRCA 1/2 Sister    History  Substance Use Topics  . Smoking status: Never Smoker   . Smokeless tobacco: Never Used  . Alcohol Use: No     Comment: 07/20/2012 "might have a beer once/yr"   OB History   Grav Para Term Preterm Abortions TAB SAB Ect Mult Living   2 2 2  0 0 0 0 0 0 2     Review of Systems  Constitutional: Negative.   Musculoskeletal: Negative.   Skin: Positive for rash.    Allergies  Other  Home Medications   Current Outpatient Rx  Name  Route  Sig  Dispense  Refill  .  amLODipine (NORVASC) 10 MG tablet   Oral   Take 1 tablet (10 mg total) by mouth daily.   30 tablet   2   . famotidine (PEPCID) 40 MG tablet   Oral   Take 0.5 tablets (20 mg total) by mouth daily. OTC   10 tablet   0   . furosemide (LASIX) 80 MG tablet   Oral   Take 80 mg by mouth.         . metoprolol tartrate (LOPRESSOR) 25 MG tablet   Oral   Take 1 tablet (25 mg total) by mouth 2 (two) times daily.   60 tablet   2   . mycophenolate (CELLCEPT) 500 MG tablet   Oral   Take 500 mg by mouth 2 (two) times daily.         . predniSONE (DELTASONE) 20 MG tablet   Oral   Take 3 tablets (60 mg total) by mouth daily with breakfast.   90 tablet   1   . promethazine (PHENERGAN) 25 MG tablet   Oral   Take 1 tablet (25 mg total) by mouth every 6 (six) hours  as needed for nausea.   12 tablet   0   . sulfamethoxazole-trimethoprim (BACTRIM DS,SEPTRA DS) 800-160 MG per tablet   Oral   Take 1 tablet by mouth 2 (two) times daily.   14 tablet   0   . traMADol (ULTRAM) 50 MG tablet   Oral   Take 1 tablet (50 mg total) by mouth every 6 (six) hours as needed for pain.   15 tablet   0    BP 159/95  Pulse 90  Temp(Src) 98.4 F (36.9 C) (Oral)  Resp 16  SpO2 100% Physical Exam  Nursing note and vitals reviewed. Constitutional: She is oriented to person, place, and time. She appears well-developed and well-nourished.  Musculoskeletal: She exhibits tenderness.  Neurological: She is alert and oriented to person, place, and time.  Skin: Skin is warm and dry.  lg pustular swelling to pericuticular skin of rmf tip.    ED Course  INCISION AND DRAINAGE Date/Time: 03/31/2013 11:52 AM Performed by: Billy Fischer Authorized by: Ihor Gully D Consent: Verbal consent obtained. Risks and benefits: risks, benefits and alternatives were discussed Consent given by: patient Type: abscess Body area: upper extremity Location details: right long finger Local anesthetic: topical  anesthetic Patient sedated: no Scalpel size: 11 Incision type: single straight Complexity: simple Drainage: purulent Drainage amount: copious Packing material: 1/4 in gauze Patient tolerance: Patient tolerated the procedure well with no immediate complications. Comments: Culture obtained., betadine soaked, dsd   (including critical care time) Labs Review Labs Reviewed  CULTURE, ROUTINE-ABSCESS   Imaging Review No results found.  EKG Interpretation    Date/Time:    Ventricular Rate:    PR Interval:    QRS Duration:   QT Interval:    QTC Calculation:   R Axis:     Text Interpretation:              MDM  I+d of paronychia    Billy Fischer, MD 03/31/13 1159

## 2013-04-04 LAB — CULTURE, ROUTINE-ABSCESS

## 2013-05-08 ENCOUNTER — Ambulatory Visit
Admission: RE | Admit: 2013-05-08 | Discharge: 2013-05-08 | Disposition: A | Discharge status: Home / Self Care | Payer: Medicaid Other | Source: Ambulatory Visit | Attending: Rheumatology | Admitting: Rheumatology

## 2013-05-08 ENCOUNTER — Other Ambulatory Visit: Payer: Self-pay | Admitting: Rheumatology

## 2013-05-08 DIAGNOSIS — R1032 Left lower quadrant pain: Secondary | ICD-10-CM

## 2013-05-15 ENCOUNTER — Other Ambulatory Visit: Payer: Self-pay | Admitting: Family Medicine

## 2013-05-15 DIAGNOSIS — R52 Pain, unspecified: Secondary | ICD-10-CM

## 2013-05-20 ENCOUNTER — Ambulatory Visit
Admission: RE | Admit: 2013-05-20 | Discharge: 2013-05-20 | Disposition: A | Discharge status: Home / Self Care | Payer: Medicaid Other | Source: Ambulatory Visit | Attending: Family Medicine | Admitting: Family Medicine

## 2013-05-20 DIAGNOSIS — R52 Pain, unspecified: Secondary | ICD-10-CM

## 2013-10-05 ENCOUNTER — Encounter (HOSPITAL_COMMUNITY): Payer: Self-pay | Admitting: Emergency Medicine

## 2013-10-05 ENCOUNTER — Emergency Department (HOSPITAL_COMMUNITY)
Admission: EM | Admit: 2013-10-05 | Discharge: 2013-10-06 | Disposition: A | Discharge status: Home / Self Care | Payer: Medicaid Other | Attending: Emergency Medicine | Admitting: Emergency Medicine

## 2013-10-05 DIAGNOSIS — Z792 Long term (current) use of antibiotics: Secondary | ICD-10-CM | POA: Diagnosis not present

## 2013-10-05 DIAGNOSIS — I10 Essential (primary) hypertension: Secondary | ICD-10-CM | POA: Diagnosis not present

## 2013-10-05 DIAGNOSIS — Z9889 Other specified postprocedural states: Secondary | ICD-10-CM | POA: Insufficient documentation

## 2013-10-05 DIAGNOSIS — Z9089 Acquired absence of other organs: Secondary | ICD-10-CM | POA: Insufficient documentation

## 2013-10-05 DIAGNOSIS — R319 Hematuria, unspecified: Secondary | ICD-10-CM

## 2013-10-05 DIAGNOSIS — R011 Cardiac murmur, unspecified: Secondary | ICD-10-CM | POA: Insufficient documentation

## 2013-10-05 DIAGNOSIS — IMO0002 Reserved for concepts with insufficient information to code with codable children: Secondary | ICD-10-CM | POA: Diagnosis not present

## 2013-10-05 DIAGNOSIS — N390 Urinary tract infection, site not specified: Secondary | ICD-10-CM | POA: Insufficient documentation

## 2013-10-05 DIAGNOSIS — M329 Systemic lupus erythematosus, unspecified: Secondary | ICD-10-CM | POA: Diagnosis not present

## 2013-10-05 DIAGNOSIS — Z79899 Other long term (current) drug therapy: Secondary | ICD-10-CM | POA: Diagnosis not present

## 2013-10-05 DIAGNOSIS — Z862 Personal history of diseases of the blood and blood-forming organs and certain disorders involving the immune mechanism: Secondary | ICD-10-CM | POA: Insufficient documentation

## 2013-10-05 DIAGNOSIS — Z8659 Personal history of other mental and behavioral disorders: Secondary | ICD-10-CM | POA: Insufficient documentation

## 2013-10-05 DIAGNOSIS — M545 Low back pain, unspecified: Secondary | ICD-10-CM | POA: Diagnosis present

## 2013-10-05 DIAGNOSIS — Z9851 Tubal ligation status: Secondary | ICD-10-CM | POA: Insufficient documentation

## 2013-10-05 DIAGNOSIS — Z3202 Encounter for pregnancy test, result negative: Secondary | ICD-10-CM | POA: Diagnosis not present

## 2013-10-05 LAB — I-STAT CHEM 8, ED
BUN: 16 mg/dL (ref 6–23)
CALCIUM ION: 1.2 mmol/L (ref 1.12–1.23)
Chloride: 107 mEq/L (ref 96–112)
Creatinine, Ser: 1.6 mg/dL — ABNORMAL HIGH (ref 0.50–1.10)
GLUCOSE: 79 mg/dL (ref 70–99)
HCT: 31 % — ABNORMAL LOW (ref 36.0–46.0)
HEMOGLOBIN: 10.5 g/dL — AB (ref 12.0–15.0)
Potassium: 3.1 mEq/L — ABNORMAL LOW (ref 3.7–5.3)
Sodium: 141 mEq/L (ref 137–147)
TCO2: 23 mmol/L (ref 0–100)

## 2013-10-05 LAB — URINE MICROSCOPIC-ADD ON

## 2013-10-05 LAB — URINALYSIS, ROUTINE W REFLEX MICROSCOPIC
Bilirubin Urine: NEGATIVE
GLUCOSE, UA: NEGATIVE mg/dL
Ketones, ur: NEGATIVE mg/dL
Nitrite: POSITIVE — AB
PH: 5.5 (ref 5.0–8.0)
Protein, ur: >300 mg/dL — AB
Specific Gravity, Urine: 1.022 (ref 1.005–1.030)
Urobilinogen, UA: 0.2 mg/dL (ref 0.0–1.0)

## 2013-10-05 MED ORDER — CIPROFLOXACIN HCL 500 MG PO TABS: 500.0000 mg | ORAL_TABLET | Freq: Two times a day (BID) | ORAL | Status: DC

## 2013-10-05 MED ORDER — CIPROFLOXACIN HCL 500 MG PO TABS
500.0000 mg | ORAL_TABLET | Freq: Once | ORAL | Status: AC
  Administered 2013-10-06: 500 mg via ORAL
  Filled 2013-10-05: qty 1

## 2013-10-05 NOTE — ED Notes (Signed)
Pt reports pain started yesterday bilateral sides, now is in her lower back. Reports having sensation of full bladder but only urinating small amounts at a time. Denies pain with urination. No acute distress noted at triage.

## 2013-10-05 NOTE — ED Provider Notes (Signed)
CSN: [id number]     Arrival date & time 10/05/13  1645 History   First MD Initiated Contact with Patient 10/05/13 2052     Chief Complaint  Patient presents with  . Back Pain      HPI Pt reports pain started yesterday bilateral sides, now is in her lower back. Reports having sensation of full bladder but only urinating small amounts at a time. Denies pain with urination or fever, chills.. No acute distress noted at triage.  Patient has history of renal insufficiency and lupus along with hypertension.  Past Medical History  Diagnosis Date  . Hypertension   . Cardiac arrhythmia   . SVT (supraventricular tachycardia)     "today, last week, 2 wk ago; maybe 1 month ago, etc; started w/in last 3-4 yrs"(07/20/2012)  . Fainting     "~ 1 month ago; probably related to SVT" (07/20/2012)  . Heart murmur     "small" (07/20/2012)  . Shortness of breath     "related to SVT episodes" (07/20/2012)  . Anemia     "today" (07/20/2012)  . Chest pain at rest     "related to SVT" (07/20/2012)  . Sleep apnea     "don't wear mask" (07/20/2012)  . Daily headache   . Anxiety   . Lupus (systemic lupus erythematosus)    Past Surgical History  Procedure Laterality Date  . Appendectomy  2011  . Tubal ligation  2010  . Cesarean section  2001; 2010  . Cervical biopsy  2013  . Ablation      07/21/12 Dr. Cristopher Peru    Family History  Problem Relation Age of Onset  . Cancer Mother     breast and ovarian  . BRCA 1/2 Sister    History  Substance Use Topics  . Smoking status: Never Smoker   . Smokeless tobacco: Never Used  . Alcohol Use: No     Comment: 07/20/2012 "might have a beer once/yr"   OB History   Grav Para Term Preterm Abortions TAB SAB Ect Mult Living   2 2 2  0 0 0 0 0 0 2     Review of Systems  All other systems reviewed and are negative  Allergies  Other  Home Medications   Prior to Admission medications   Medication Sig Start Date End Date Taking? Authorizing Provider   acetaminophen (TYLENOL) 325 MG tablet Take 650 mg by mouth every 6 (six) hours as needed.   Yes Historical Provider, MD  amLODipine (NORVASC) 10 MG tablet Take 1 tablet (10 mg total) by mouth daily. 11/15/12  Yes Oswald Hillock, MD  enalapril (VASOTEC) 10 MG tablet Take 10 mg by mouth daily.   Yes Historical Provider, MD  famotidine (PEPCID) 40 MG tablet Take 40 mg by mouth daily as needed for heartburn or indigestion.   Yes Historical Provider, MD  furosemide (LASIX) 80 MG tablet Take 80 mg by mouth daily as needed for fluid.    Yes Historical Provider, MD  mycophenolate (CELLCEPT) 500 MG tablet Take 1,000 mg by mouth 2 (two) times daily.    Yes Historical Provider, MD  predniSONE (DELTASONE) 10 MG tablet Take 10 mg by mouth daily with breakfast.   Yes Historical Provider, MD  ciprofloxacin (CIPRO) 500 MG tablet Take 1 tablet (500 mg total) by mouth 2 (two) times daily. 10/06/13   Dot Lanes, MD  potassium chloride (K-DUR) 10 MEQ tablet Take 1 tablet (10 mEq total) by mouth daily. 10/06/13  Dot Lanes, MD   BP 153/77  Pulse 97  Temp(Src) 98.4 F (36.9 C) (Oral)  Resp 28  SpO2 100%  LMP 09/17/2013 Physical Exam Physical Exam  Nursing note and vitals reviewed. Constitutional: She is oriented to person, place, and time. She appears well-developed and well-nourished. No distress.  HENT:  Head: Normocephalic and atraumatic.  Eyes: Pupils are equal, round, and reactive to light.  Neck: Normal range of motion.  Cardiovascular: Normal rate and intact distal pulses.   Pulmonary/Chest: No respiratory distress.  no CVA tenderness to palpation. Abdominal: Normal appearance. She exhibits no distension.  no tenderness to palpation.  No rebound no guarding tenderness.  No masses. Musculoskeletal: Normal range of motion.  Neurological: She is alert and oriented to person, place, and time. No cranial nerve deficit.  Skin: Skin is warm and dry. No rash noted.  Psychiatric: She has a normal mood  and affect. Her behavior is normal.   ED Course  Procedures (including critical care time) Labs Review Labs Reviewed  URINALYSIS, ROUTINE W REFLEX MICROSCOPIC - Abnormal; Notable for the following:    APPearance TURBID (*)    Hgb urine dipstick LARGE (*)    Protein, ur >300 (*)    Nitrite POSITIVE (*)    Leukocytes, UA SMALL (*)    All other components within normal limits  URINE MICROSCOPIC-ADD ON - Abnormal; Notable for the following:    Squamous Epithelial / LPF MANY (*)    Bacteria, UA MANY (*)    Casts HYALINE CASTS (*)    All other components within normal limits  I-STAT CHEM 8, ED - Abnormal; Notable for the following:    Potassium 3.1 (*)    Creatinine, Ser 1.60 (*)    Hemoglobin 10.5 (*)    HCT 31.0 (*)    All other components within normal limits  URINE CULTURE  POC URINE PREG, ED    Imaging Review No results found.  After treatment in the ED the patient feels back to baseline and wants to go home.  MDM   Final diagnoses:  Urinary tract infection with hematuria, site unspecified  Lupus        Dot Lanes, MD 10/12/13 1239

## 2013-10-05 NOTE — Discharge Instructions (Signed)

## 2013-10-06 MED ORDER — POTASSIUM CHLORIDE ER 10 MEQ PO TBCR: 10.0000 meq | EXTENDED_RELEASE_TABLET | Freq: Every day | ORAL | Status: DC

## 2013-10-10 LAB — URINE CULTURE

## 2013-10-12 ENCOUNTER — Telehealth (HOSPITAL_BASED_OUTPATIENT_CLINIC_OR_DEPARTMENT_OTHER): Payer: Self-pay | Admitting: Emergency Medicine

## 2013-10-12 NOTE — Telephone Encounter (Signed)
Post ED Visit - Positive Culture Follow-up  Culture report reviewed by antimicrobial stewardship pharmacist: []  Wes [place], Pharm.D., BCPS []  Heide Guile, Pharm.D., BCPS []  Alycia Rossetti, Pharm.D., BCPS [x]  [place], [place].D., BCPS, AAHIVP []  Legrand Como, Pharm.D., BCPS, AAHIVP  Positive urine culture Treated with Cipro, organism sensitive to the same and no further patient follow-up is required at this time.  Myrna Blazer 10/12/2013, 5:31 PM

## 2014-01-07 ENCOUNTER — Encounter (HOSPITAL_COMMUNITY): Payer: Self-pay | Admitting: Emergency Medicine

## 2014-01-07 ENCOUNTER — Emergency Department (HOSPITAL_COMMUNITY)
Admission: EM | Admit: 2014-01-07 | Discharge: 2014-01-07 | Disposition: A | Discharge status: Home / Self Care | Payer: Medicaid Other | Attending: Emergency Medicine | Admitting: Emergency Medicine

## 2014-01-07 DIAGNOSIS — Z3202 Encounter for pregnancy test, result negative: Secondary | ICD-10-CM | POA: Insufficient documentation

## 2014-01-07 DIAGNOSIS — Z9851 Tubal ligation status: Secondary | ICD-10-CM | POA: Insufficient documentation

## 2014-01-07 DIAGNOSIS — R1030 Lower abdominal pain, unspecified: Secondary | ICD-10-CM | POA: Diagnosis present

## 2014-01-07 DIAGNOSIS — R011 Cardiac murmur, unspecified: Secondary | ICD-10-CM | POA: Diagnosis not present

## 2014-01-07 DIAGNOSIS — R112 Nausea with vomiting, unspecified: Secondary | ICD-10-CM | POA: Diagnosis not present

## 2014-01-07 DIAGNOSIS — Z9089 Acquired absence of other organs: Secondary | ICD-10-CM | POA: Diagnosis not present

## 2014-01-07 DIAGNOSIS — R000 Tachycardia, unspecified: Secondary | ICD-10-CM | POA: Diagnosis not present

## 2014-01-07 DIAGNOSIS — I10 Essential (primary) hypertension: Secondary | ICD-10-CM | POA: Diagnosis not present

## 2014-01-07 DIAGNOSIS — Z79899 Other long term (current) drug therapy: Secondary | ICD-10-CM | POA: Diagnosis not present

## 2014-01-07 DIAGNOSIS — Z862 Personal history of diseases of the blood and blood-forming organs and certain disorders involving the immune mechanism: Secondary | ICD-10-CM | POA: Diagnosis not present

## 2014-01-07 DIAGNOSIS — R1912 Hyperactive bowel sounds: Secondary | ICD-10-CM | POA: Diagnosis not present

## 2014-01-07 DIAGNOSIS — F419 Anxiety disorder, unspecified: Secondary | ICD-10-CM | POA: Diagnosis not present

## 2014-01-07 DIAGNOSIS — Z872 Personal history of diseases of the skin and subcutaneous tissue: Secondary | ICD-10-CM | POA: Diagnosis not present

## 2014-01-07 LAB — URINALYSIS, ROUTINE W REFLEX MICROSCOPIC
BILIRUBIN URINE: NEGATIVE
Glucose, UA: NEGATIVE mg/dL
Ketones, ur: NEGATIVE mg/dL
Leukocytes, UA: NEGATIVE
NITRITE: NEGATIVE
PH: 6 (ref 5.0–8.0)
Protein, ur: >300 mg/dL — AB
SPECIFIC GRAVITY, URINE: 1.016 (ref 1.005–1.030)
Urobilinogen, UA: 0.2 mg/dL (ref 0.0–1.0)

## 2014-01-07 LAB — CBC
HCT: 30.1 % — ABNORMAL LOW (ref 36.0–46.0)
Hemoglobin: 9.6 g/dL — ABNORMAL LOW (ref 12.0–15.0)
MCH: 29.2 pg (ref 26.0–34.0)
MCHC: 31.9 g/dL (ref 30.0–36.0)
MCV: 91.5 fL (ref 78.0–100.0)
PLATELETS: 260 10*3/uL (ref 150–400)
RBC: 3.29 MIL/uL — ABNORMAL LOW (ref 3.87–5.11)
RDW: 14.8 % (ref 11.5–15.5)
WBC: 3.5 10*3/uL — ABNORMAL LOW (ref 4.0–10.5)

## 2014-01-07 LAB — COMPREHENSIVE METABOLIC PANEL
ALK PHOS: 40 U/L (ref 39–117)
ALT: 15 U/L (ref 0–35)
AST: 35 U/L (ref 0–37)
Albumin: 2.4 g/dL — ABNORMAL LOW (ref 3.5–5.2)
Anion gap: 7 (ref 5–15)
BUN: 11 mg/dL (ref 6–23)
CO2: 26 mEq/L (ref 19–32)
Calcium: 8.6 mg/dL (ref 8.4–10.5)
Chloride: 106 mEq/L (ref 96–112)
Creatinine, Ser: 1.5 mg/dL — ABNORMAL HIGH (ref 0.50–1.10)
GFR calc Af Amer: 50 mL/min — ABNORMAL LOW (ref >90)
GFR calc non Af Amer: 44 mL/min — ABNORMAL LOW (ref >90)
Glucose, Bld: 89 mg/dL (ref 70–99)
POTASSIUM: 3.3 meq/L — AB (ref 3.7–5.3)
SODIUM: 139 meq/L (ref 137–147)
TOTAL PROTEIN: 6.7 g/dL (ref 6.0–8.3)
Total Bilirubin: <0.2 mg/dL — ABNORMAL LOW (ref 0.3–1.2)

## 2014-01-07 LAB — POC URINE PREG, ED: PREG TEST UR: NEGATIVE

## 2014-01-07 LAB — URINE MICROSCOPIC-ADD ON

## 2014-01-07 MED ORDER — DICYCLOMINE HCL 10 MG PO CAPS
10.0000 mg | ORAL_CAPSULE | Freq: Once | ORAL | Status: AC
  Administered 2014-01-07: 10 mg via ORAL
  Filled 2014-01-07: qty 1

## 2014-01-07 MED ORDER — DICYCLOMINE HCL 20 MG PO TABS: 20.0000 mg | ORAL_TABLET | Freq: Two times a day (BID) | ORAL | Status: DC

## 2014-01-07 MED ORDER — ONDANSETRON 4 MG PO TBDP: ORAL_TABLET | ORAL | Status: DC

## 2014-01-07 MED ORDER — HYDROCODONE-ACETAMINOPHEN 5-325 MG PO TABS
1.0000 | ORAL_TABLET | Freq: Once | ORAL | Status: AC
Start: 2014-01-07 — End: 2014-01-07
  Administered 2014-01-07: 1 via ORAL
  Filled 2014-01-07: qty 1

## 2014-01-07 MED ORDER — ONDANSETRON 4 MG PO TBDP
4.0000 mg | ORAL_TABLET | Freq: Once | ORAL | Status: AC
  Administered 2014-01-07: 4 mg via ORAL
  Filled 2014-01-07: qty 1

## 2014-01-07 NOTE — Discharge Instructions (Signed)
Take zofran as directed as needed for nausea. Take bentyl as directed for your abdominal pain.  Abdominal Pain Many things can cause abdominal pain. Usually, abdominal pain is not caused by a disease and will improve without treatment. It can often be observed and treated at home. Your health care provider will do a physical exam and possibly order blood tests and X-rays to help determine the seriousness of your pain. However, in many cases, more time must pass before a clear cause of the pain can be found. Before that point, your health care provider may not know if you need more testing or further treatment. HOME CARE INSTRUCTIONS  Monitor your abdominal pain for any changes. The following actions may help to alleviate any discomfort you are experiencing:  Only take over-the-counter or prescription medicines as directed by your health care provider.  Do not take laxatives unless directed to do so by your health care provider.  Try a clear liquid diet (broth, tea, or water) as directed by your health care provider. Slowly move to a bland diet as tolerated. SEEK MEDICAL CARE IF:  You have unexplained abdominal pain.  You have abdominal pain associated with nausea or diarrhea.  You have pain when you urinate or have a bowel movement.  You experience abdominal pain that wakes you in the night.  You have abdominal pain that is worsened or improved by eating food.  You have abdominal pain that is worsened with eating fatty foods.  You have a fever. SEEK IMMEDIATE MEDICAL CARE IF:   Your pain does not go away within 2 hours.  You keep throwing up (vomiting).  Your pain is felt only in portions of the abdomen, such as the right side or the left lower portion of the abdomen.  You pass bloody or black tarry stools. MAKE SURE YOU:  Understand these instructions.   Will watch your condition.   Will get help right away if you are not doing well or get worse.  Document Released:  12/24/2004 Document Revised: 03/21/2013 Document Reviewed: 11/23/2012 [name] Patient Information 2015 [place], [place]. This information is not intended to replace advice given to you by your health care provider. Make sure you discuss any questions you have with your health care provider. Food Choices to Help Relieve Diarrhea When you have diarrhea, the foods you eat and your eating habits are very important. Choosing the right foods and drinks can help relieve diarrhea. Also, because diarrhea can last up to 7 days, you need to replace lost fluids and electrolytes (such as sodium, potassium, and chloride) in order to help prevent dehydration.  WHAT GENERAL GUIDELINES DO I NEED TO FOLLOW?  Slowly drink 1 cup (8 oz) of fluid for each episode of diarrhea. If you are getting enough fluid, your urine will be clear or pale yellow.  Eat starchy foods. Some good choices include white rice, white toast, pasta, low-fiber cereal, baked potatoes (without the skin), saltine crackers, and bagels.  Avoid large servings of any cooked vegetables.  Limit fruit to two servings per day. A serving is  cup or 1 small piece.  Choose foods with less than 2 g of fiber per serving.  Limit fats to less than 8 tsp (38 g) per day.  Avoid fried foods.  Eat foods that have probiotics in them. Probiotics can be found in certain dairy products.  Avoid foods and beverages that may increase the speed at which food moves through the stomach and intestines (gastrointestinal tract). Things to  avoid include:  High-fiber foods, such as dried fruit, raw fruits and vegetables, nuts, seeds, and whole grain foods.  Spicy foods and high-fat foods.  Foods and beverages sweetened with high-fructose corn syrup, honey, or sugar alcohols such as xylitol, sorbitol, and mannitol. WHAT FOODS ARE RECOMMENDED? Grains White rice. White, [place], or pita breads (fresh or toasted), including plain rolls, buns, or bagels. White pasta.  Saltine, soda, or graham crackers. Pretzels. Low-fiber cereal. Cooked cereals made with water (such as cornmeal, farina, or cream cereals). Plain muffins. Matzo. Melba toast. Zwieback.  Vegetables Potatoes (without the skin). Strained tomato and vegetable juices. Most well-cooked and canned vegetables without seeds. Tender lettuce. Fruits Cooked or canned applesauce, apricots, cherries, fruit cocktail, grapefruit, peaches, pears, or plums. Fresh bananas, apples without skin, cherries, grapes, cantaloupe, grapefruit, peaches, oranges, or plums.  Meat and Other Protein Products Baked or boiled chicken. Eggs. Tofu. Fish. Seafood. Smooth peanut butter. Ground or well-cooked tender beef, ham, veal, lamb, pork, or poultry.  Dairy Plain yogurt, kefir, and unsweetened liquid yogurt. Lactose-free milk, buttermilk, or soy milk. Plain hard cheese. Beverages Sport drinks. Clear broths. Diluted fruit juices (except prune). Regular, caffeine-free sodas such as ginger ale. Water. Decaffeinated teas. Oral rehydration solutions. Sugar-free beverages not sweetened with sugar alcohols. Other Bouillon, broth, or soups made from recommended foods.  The items listed above may not be a complete list of recommended foods or beverages. Contact your dietitian for more options. WHAT FOODS ARE NOT RECOMMENDED? Grains Whole grain, whole wheat, bran, or rye breads, rolls, pastas, crackers, and cereals. Wild or brown rice. Cereals that contain more than 2 g of fiber per serving. Corn tortillas or taco shells. Cooked or dry oatmeal. Granola. Popcorn. Vegetables Raw vegetables. Cabbage, broccoli, Brussels sprouts, artichokes, baked beans, beet greens, corn, kale, legumes, peas, sweet potatoes, and yams. Potato skins. Cooked spinach and cabbage. Fruits Dried fruit, including raisins and dates. Raw fruits. Stewed or dried prunes. Fresh apples with skin, apricots, mangoes, pears, raspberries, and strawberries.  Meat and Other  Protein Products Chunky peanut butter. Nuts and seeds. Beans and lentils. [name].  Dairy High-fat cheeses. Milk, chocolate milk, and beverages made with milk, such as milk shakes. Cream. Ice cream. Sweets and Desserts Sweet rolls, doughnuts, and sweet breads. Pancakes and waffles. Fats and Oils Butter. Cream sauces. Margarine. Salad oils. Plain salad dressings. Olives. Avocados.  Beverages Caffeinated beverages (such as coffee, tea, soda, or energy drinks). Alcoholic beverages. Fruit juices with pulp. Prune juice. Soft drinks sweetened with high-fructose corn syrup or sugar alcohols. Other Coconut. Hot sauce. Chili powder. Mayonnaise. Gravy. Cream-based or milk-based soups.  The items listed above may not be a complete list of foods and beverages to avoid. Contact your dietitian for more information. WHAT SHOULD I DO IF I BECOME DEHYDRATED? Diarrhea can sometimes lead to dehydration. Signs of dehydration include dark urine and dry mouth and skin. If you think you are dehydrated, you should rehydrate with an oral rehydration solution. These solutions can be purchased at pharmacies, retail stores, or online.  Drink -1 cup (120-240 mL) of oral rehydration solution each time you have an episode of diarrhea. If drinking this amount makes your diarrhea worse, try drinking smaller amounts more often. For example, drink 1-3 tsp (5-15 mL) every 5-10 minutes.  A general rule for staying hydrated is to drink 1-2 L of fluid per day. Talk to your health care provider about the specific amount you should be drinking each day. Drink enough fluids to keep your urine  clear or pale yellow. Document Released: 06/06/2003 Document Revised: 03/21/2013 Document Reviewed: 02/06/2013 [facility] Patient Information 2015 [place], [place]. This information is not intended to replace advice given to you by your health care provider. Make sure you discuss any questions you have with your health care provider. Nausea and  Vomiting Nausea is a sick feeling that often comes before throwing up (vomiting). Vomiting is a reflex where stomach contents come out of your mouth. Vomiting can cause severe loss of body fluids (dehydration). Children and elderly adults can become dehydrated quickly, especially if they also have diarrhea. Nausea and vomiting are symptoms of a condition or disease. It is important to find the cause of your symptoms. CAUSES   Direct irritation of the stomach lining. This irritation can result from increased acid production (gastroesophageal reflux disease), infection, food poisoning, taking certain medicines (such as nonsteroidal anti-inflammatory drugs), alcohol use, or tobacco use.  Signals from the brain.These signals could be caused by a headache, heat exposure, an inner ear disturbance, increased pressure in the brain from injury, infection, a tumor, or a concussion, pain, emotional stimulus, or metabolic problems.  An obstruction in the gastrointestinal tract (bowel obstruction).  Illnesses such as diabetes, hepatitis, gallbladder problems, appendicitis, kidney problems, cancer, sepsis, atypical symptoms of a heart attack, or eating disorders.  Medical treatments such as chemotherapy and radiation.  Receiving medicine that makes you sleep (general anesthetic) during surgery. DIAGNOSIS Your caregiver may ask for tests to be done if the problems do not improve after a few days. Tests may also be done if symptoms are severe or if the reason for the nausea and vomiting is not clear. Tests may include:  Urine tests.  Blood tests.  Stool tests.  Cultures (to look for evidence of infection).  X-rays or other imaging studies. Test results can help your caregiver make decisions about treatment or the need for additional tests. TREATMENT You need to stay well hydrated. Drink frequently but in small amounts.You may wish to drink water, sports drinks, clear broth, or eat frozen ice pops or  gelatin dessert to help stay hydrated.When you eat, eating slowly may help prevent nausea.There are also some antinausea medicines that may help prevent nausea. HOME CARE INSTRUCTIONS   Take all medicine as directed by your caregiver.  If you do not have an appetite, do not force yourself to eat. However, you must continue to drink fluids.  If you have an appetite, eat a normal diet unless your caregiver tells you differently.  Eat a variety of complex carbohydrates (rice, wheat, potatoes, bread), lean meats, yogurt, fruits, and vegetables.  Avoid high-fat foods because they are more difficult to digest.  Drink enough water and fluids to keep your urine clear or pale yellow.  If you are dehydrated, ask your caregiver for specific rehydration instructions. Signs of dehydration may include:  Severe thirst.  Dry lips and mouth.  Dizziness.  Dark urine.  Decreasing urine frequency and amount.  Confusion.  Rapid breathing or pulse. SEEK IMMEDIATE MEDICAL CARE IF:   You have blood or brown flecks (like coffee grounds) in your vomit.  You have black or bloody stools.  You have a severe headache or stiff neck.  You are confused.  You have severe abdominal pain.  You have chest pain or trouble breathing.  You do not urinate at least once every 8 hours.  You develop cold or clammy skin.  You continue to vomit for longer than 24 to 48 hours.  You have a  fever. MAKE SURE YOU:   Understand these instructions.  Will watch your condition.  Will get help right away if you are not doing well or get worse. Document Released: 03/16/2005 Document Revised: 06/08/2011 Document Reviewed: 08/13/2010 [facility] Patient Information 2015 [place], [place]. This information is not intended to replace advice given to you by your health care provider. Make sure you discuss any questions you have with your health care provider.

## 2014-01-07 NOTE — ED Notes (Signed)
Pt reports that she started having abd pain and vomiting this morning. Reports the pain comes and goes. Denies any urinary symptoms, diarrhea or constipation.

## 2014-01-07 NOTE — ED Provider Notes (Signed)
Medical screening examination/treatment/procedure(s) were performed by non-physician practitioner and as supervising physician I was immediately available for consultation/collaboration.   EKG Interpretation None        Hoy Morn, MD 01/07/14 1259

## 2014-01-07 NOTE — ED Notes (Signed)
Provider at the bedside.  

## 2014-01-07 NOTE — ED Provider Notes (Signed)
CSN: [id number]     Arrival date & time 01/07/14  0730 History   First MD Initiated Contact with Patient 01/07/14 0739     Chief Complaint  Patient presents with  . Abdominal Pain  . Emesis     (Consider location/radiation/quality/duration/timing/severity/associated sxs/prior Treatment) HPI Comments: This is a 38 y/o female with a PMHx of hypertension, SVT, anemia, sleep apnea, anxiety and lupus who presents to the emergency department complaining of abdominal pain, nausea and vomiting beginning around 4:30 AM today. Patient reports yesterday evening around 9:00 PM she had a low-grade fever of 100.2, did not take any medications, and this morning she woke up with abdominal pain. Pain radiates across her lower abdomen and around to both sides of her lower back, as a constant dull, contracting pain with occasional episodes of sharp, severe pain. Nothing in specific makes the pain worse. She has not tried any alleviating factors for her symptoms. Last episode of vomiting was around 6:30 AM. Emesis is nonbloody, nonbilious. Denies diarrhea, increased urinary frequency, urgency, dysuria, vaginal bleeding or discharge. History of appendectomy and tubal ligation.  Patient is a 38 y.o. female presenting with abdominal pain and vomiting. The history is provided by the patient.  Abdominal Pain Associated symptoms: nausea and vomiting   Emesis Associated symptoms: abdominal pain     Past Medical History  Diagnosis Date  . Hypertension   . Cardiac arrhythmia   . SVT (supraventricular tachycardia)     "today, last week, 2 wk ago; maybe 1 month ago, etc; started w/in last 3-4 yrs"(07/20/2012)  . Fainting     "~ 1 month ago; probably related to SVT" (07/20/2012)  . Heart murmur     "small" (07/20/2012)  . Shortness of breath     "related to SVT episodes" (07/20/2012)  . Anemia     "today" (07/20/2012)  . Chest pain at rest     "related to SVT" (07/20/2012)  . Sleep apnea     "don't wear mask"  (07/20/2012)  . Daily headache   . Anxiety   . Lupus (systemic lupus erythematosus)    Past Surgical History  Procedure Laterality Date  . Appendectomy  2011  . Tubal ligation  2010  . Cesarean section  2001; 2010  . Cervical biopsy  2013  . Ablation      07/21/12 Dr. Cristopher Peru    Family History  Problem Relation Age of Onset  . Cancer Mother     breast and ovarian  . BRCA 1/2 Sister    History  Substance Use Topics  . Smoking status: Never Smoker   . Smokeless tobacco: Never Used  . Alcohol Use: No     Comment: 07/20/2012 "might have a beer once/yr"   OB History   Grav Para Term Preterm Abortions TAB SAB Ect Mult Living   2 2 2  0 0 0 0 0 0 2     Review of Systems  Gastrointestinal: Positive for nausea, vomiting and abdominal pain.  Musculoskeletal: Positive for back pain.  All other systems reviewed and are negative.     Allergies  Other  Home Medications   Prior to Admission medications   Medication Sig Start Date End Date Taking? Authorizing Provider  acetaminophen (TYLENOL) 500 MG tablet Take 500 mg by mouth every 6 (six) hours as needed.   Yes Historical Provider, MD  alendronate (FOSAMAX) 70 MG tablet Take 70 mg by mouth once a week. Take on Wednesdays with a full glass of water  on an empty stomach.   Yes Historical Provider, MD  amLODipine (NORVASC) 10 MG tablet Take 1 tablet (10 mg total) by mouth daily. 11/15/12  Yes Oswald Hillock, MD  calcium carbonate (OS-CAL) 1250 MG chewable tablet Chew 1 tablet by mouth daily as needed for heartburn.   Yes Historical Provider, MD  enalapril (VASOTEC) 10 MG tablet Take 10 mg by mouth daily.   Yes Historical Provider, MD  mycophenolate (CELLCEPT) 500 MG tablet Take 1,000 mg by mouth 2 (two) times daily.    Yes Historical Provider, MD  predniSONE (DELTASONE) 10 MG tablet Take 5 mg by mouth daily with breakfast.    Yes Historical Provider, MD  PRESCRIPTION MEDICATION Apply 5 mLs topically 4 (four) times daily as needed  (Thrush). Magic Mouthwash   Yes Historical Provider, MD  traMADol (ULTRAM) 50 MG tablet Take by mouth daily as needed for moderate pain.   Yes Historical Provider, MD  dicyclomine (BENTYL) 20 MG tablet Take 1 tablet (20 mg total) by mouth 2 (two) times daily. 01/07/14   Carman Ching, PA-C  ondansetron (ZOFRAN ODT) 4 MG disintegrating tablet 14m ODT q4 hours prn nausea/vomit 01/07/14   Fredderick Swanger M Scottie Metayer, PA-C   BP 166/87  Pulse 70  Temp(Src) 98.1 F (36.7 C) (Oral)  Resp 9  SpO2 96%  LMP 12/25/2013 Physical Exam  Nursing note and vitals reviewed. Constitutional: She is oriented to person, place, and time. She appears well-developed and well-nourished. No distress.  HENT:  Head: Normocephalic and atraumatic.  Mouth/Throat: Oropharynx is clear and moist.  Eyes: Conjunctivae and EOM are normal. No scleral icterus.  Neck: Normal range of motion. Neck supple.  Cardiovascular: Regular rhythm and normal heart sounds.   Tachycardic.  Pulmonary/Chest: Effort normal and breath sounds normal.  Abdominal: Soft. She exhibits no distension. There is no rebound and no guarding.  Hyperactive BS. TTP across lower abdomen. No peritoneal signs. No CVAT.  Musculoskeletal: Normal range of motion. She exhibits no edema.  Neurological: She is alert and oriented to person, place, and time.  Skin: Skin is warm and dry. She is not diaphoretic.  Psychiatric: She has a normal mood and affect. Her behavior is normal.    ED Course  Procedures (including critical care time) Labs Review Labs Reviewed  CBC - Abnormal; Notable for the following:    WBC 3.5 (*)    RBC 3.29 (*)    Hemoglobin 9.6 (*)    HCT 30.1 (*)    All other components within normal limits  COMPREHENSIVE METABOLIC PANEL - Abnormal; Notable for the following:    Potassium 3.3 (*)    Creatinine, Ser 1.50 (*)    Albumin 2.4 (*)    Total Bilirubin <0.2 (*)    GFR calc non Af Amer 44 (*)    GFR calc Af Amer 50 (*)    All other components within  normal limits  URINALYSIS, ROUTINE W REFLEX MICROSCOPIC - Abnormal; Notable for the following:    APPearance HAZY (*)    Hgb urine dipstick LARGE (*)    Protein, ur >300 (*)    All other components within normal limits  URINE MICROSCOPIC-ADD ON - Abnormal; Notable for the following:    Squamous Epithelial / LPF FEW (*)    Bacteria, UA FEW (*)    Casts HYALINE CASTS (*)    All other components within normal limits  URINE CULTURE  POC URINE PREG, ED    Imaging Review No results found.  EKG Interpretation None      MDM   Final diagnoses:  Lower abdominal pain  Non-intractable vomiting with nausea, vomiting of unspecified type   Patient nontoxic appearing and in no apparent distress. Afebrile, tachycardic on arrival, vitals otherwise stable. Abdomen is soft with tenderness across lower abdomen. No peritoneal signs. Low-grade fever reported yesterday, afebrile the emergency department. Labs without any acute finding. Patient is aware that she has hematuria and decreased renal functions, she is under the care of a nephrologist for this. Patient feeling better after receiving Zofran, Bentyl and Vicodin. Abdomen remains soft, she still has hyperactive bowel sounds, tenderness improved from initial exam. Tolerates PO, nausea significantly improved. Most likely gastroenteritis. Doubt pelvic in origin. UA equivocal, asymptomatic, nitrite negative, culture in process. D/c with zofran and bentyl, PCP f/u. Stable for d/c. Return precautions given. Patient states understanding of treatment care plan and is agreeable.  Carman Ching, PA-C 01/07/14 [place], PA-C 01/07/14 1014

## 2014-01-09 LAB — URINE CULTURE: Colony Count: 40000

## 2014-01-29 ENCOUNTER — Encounter (HOSPITAL_COMMUNITY): Payer: Self-pay | Admitting: Emergency Medicine

## 2014-03-21 ENCOUNTER — Emergency Department (HOSPITAL_COMMUNITY)
Admission: EM | Admit: 2014-03-21 | Discharge: 2014-03-21 | Disposition: A | Discharge status: Home / Self Care | Payer: Medicaid Other | Attending: Emergency Medicine | Admitting: Emergency Medicine

## 2014-03-21 ENCOUNTER — Emergency Department (HOSPITAL_COMMUNITY): Payer: Medicaid Other

## 2014-03-21 ENCOUNTER — Encounter (HOSPITAL_COMMUNITY): Payer: Self-pay | Admitting: Emergency Medicine

## 2014-03-21 DIAGNOSIS — Z79899 Other long term (current) drug therapy: Secondary | ICD-10-CM | POA: Diagnosis not present

## 2014-03-21 DIAGNOSIS — W010XXA Fall on same level from slipping, tripping and stumbling without subsequent striking against object, initial encounter: Secondary | ICD-10-CM | POA: Insufficient documentation

## 2014-03-21 DIAGNOSIS — S8991XA Unspecified injury of right lower leg, initial encounter: Secondary | ICD-10-CM | POA: Insufficient documentation

## 2014-03-21 DIAGNOSIS — Z8659 Personal history of other mental and behavioral disorders: Secondary | ICD-10-CM | POA: Diagnosis not present

## 2014-03-21 DIAGNOSIS — Z8669 Personal history of other diseases of the nervous system and sense organs: Secondary | ICD-10-CM | POA: Diagnosis not present

## 2014-03-21 DIAGNOSIS — R011 Cardiac murmur, unspecified: Secondary | ICD-10-CM | POA: Diagnosis not present

## 2014-03-21 DIAGNOSIS — Z7952 Long term (current) use of systemic steroids: Secondary | ICD-10-CM | POA: Insufficient documentation

## 2014-03-21 DIAGNOSIS — I10 Essential (primary) hypertension: Secondary | ICD-10-CM | POA: Diagnosis not present

## 2014-03-21 DIAGNOSIS — S6991XA Unspecified injury of right wrist, hand and finger(s), initial encounter: Secondary | ICD-10-CM | POA: Diagnosis not present

## 2014-03-21 DIAGNOSIS — Z8739 Personal history of other diseases of the musculoskeletal system and connective tissue: Secondary | ICD-10-CM | POA: Diagnosis not present

## 2014-03-21 DIAGNOSIS — Y990 Civilian activity done for income or pay: Secondary | ICD-10-CM | POA: Insufficient documentation

## 2014-03-21 DIAGNOSIS — Y9269 Other specified industrial and construction area as the place of occurrence of the external cause: Secondary | ICD-10-CM | POA: Diagnosis not present

## 2014-03-21 DIAGNOSIS — Y9389 Activity, other specified: Secondary | ICD-10-CM | POA: Diagnosis not present

## 2014-03-21 DIAGNOSIS — Z862 Personal history of diseases of the blood and blood-forming organs and certain disorders involving the immune mechanism: Secondary | ICD-10-CM | POA: Insufficient documentation

## 2014-03-21 DIAGNOSIS — M25561 Pain in right knee: Secondary | ICD-10-CM

## 2014-03-21 DIAGNOSIS — M25531 Pain in right wrist: Secondary | ICD-10-CM

## 2014-03-21 DIAGNOSIS — R52 Pain, unspecified: Secondary | ICD-10-CM

## 2014-03-21 DIAGNOSIS — W19XXXA Unspecified fall, initial encounter: Secondary | ICD-10-CM

## 2014-03-21 MED ORDER — HYDROCODONE-ACETAMINOPHEN 5-325 MG PO TABS
1.0000 | ORAL_TABLET | Freq: Once | ORAL | Status: AC
  Administered 2014-03-21: 1 via ORAL
  Filled 2014-03-21: qty 1

## 2014-03-21 MED ORDER — HYDROCODONE-ACETAMINOPHEN 5-325 MG PO TABS: 1.0000 | ORAL_TABLET | ORAL | Status: DC | PRN

## 2014-03-21 NOTE — Discharge Instructions (Signed)
Return to the emergency room with worsening of symptoms, new symptoms or with symptoms that are concerning, especially fevers, redness, swelling, red streaks from the area or warmth, numbness, tingling, weakness. RICE: Rest, Ice (three cycles of 20 mins on, 4mins off at least twice a day), compression/brace, elevation. Heating pad works well for back pain. Tylenol during the day. norco for severe pain at night. Do not operate machinery, drive or drink alcohol while taking narcotics or muscle relaxers. Follow up with PCP/orthopedist if symptoms worsen or are persistent in one week.   Arthralgia Your caregiver has diagnosed you as suffering from an arthralgia. Arthralgia means there is pain in a joint. This can come from many reasons including:  Bruising the joint which causes soreness (inflammation) in the joint.  Wear and tear on the joints which occur as we grow older (osteoarthritis).  Overusing the joint.  Various forms of arthritis.  Infections of the joint. Regardless of the cause of pain in your joint, most of these different pains respond to anti-inflammatory drugs and rest. The exception to this is when a joint is infected, and these cases are treated with antibiotics, if it is a bacterial infection. HOME CARE INSTRUCTIONS   Rest the injured area for as long as directed by your caregiver. Then slowly start using the joint as directed by your caregiver and as the pain allows. Crutches as directed may be useful if the ankles, knees or hips are involved. If the knee was splinted or casted, continue use and care as directed. If an stretchy or elastic wrapping bandage has been applied today, it should be removed and re-applied every 3 to 4 hours. It should not be applied tightly, but firmly enough to keep swelling down. Watch toes and feet for swelling, bluish discoloration, coldness, numbness or excessive pain. If any of these problems (symptoms) occur, remove the ace bandage and re-apply  more loosely. If these symptoms persist, contact your caregiver or return to this location.  For the first 24 hours, keep the injured extremity elevated on pillows while lying down.  Apply ice for 15-20 minutes to the sore joint every couple hours while awake for the first half day. Then 03-04 times per day for the first 48 hours. Put the ice in a plastic bag and place a towel between the bag of ice and your skin.  Wear any splinting, casting, elastic bandage applications, or slings as instructed.  Only take over-the-counter or prescription medicines for pain, discomfort, or fever as directed by your caregiver. Do not use aspirin immediately after the injury unless instructed by your physician. Aspirin can cause increased bleeding and bruising of the tissues.  If you were given crutches, continue to use them as instructed and do not resume weight bearing on the sore joint until instructed. Persistent pain and inability to use the sore joint as directed for more than 2 to 3 days are warning signs indicating that you should see a caregiver for a follow-up visit as soon as possible. Initially, a hairline fracture (break in bone) may not be evident on X-rays. Persistent pain and swelling indicate that further evaluation, non-weight bearing or use of the joint (use of crutches or slings as instructed), or further X-rays are indicated. X-rays may sometimes not show a small fracture until a week or 10 days later. Make a follow-up appointment with your own caregiver or one to whom we have referred you. A radiologist (specialist in reading X-rays) may read your X-rays. Make sure you  know how you are to obtain your X-ray results. Do not assume everything is normal if you do not hear from [place]. SEEK MEDICAL CARE IF: Bruising, swelling, or pain increases. SEEK IMMEDIATE MEDICAL CARE IF:   Your fingers or toes are numb or blue.  The pain is not responding to medications and continues to stay the same or get  worse.  The pain in your joint becomes severe.  You develop a fever over 102 F (38.9 C).  It becomes impossible to move or use the joint. MAKE SURE YOU:   Understand these instructions.  Will watch your condition.  Will get help right away if you are not doing well or get worse. Document Released: 03/16/2005 Document Revised: 06/08/2011 Document Reviewed: 11/02/2007 [facility] Patient Information 2015 [place], [place]. This information is not intended to replace advice given to you by your health care provider. Make sure you discuss any questions you have with your health care provider.

## 2014-03-21 NOTE — ED Notes (Signed)
Patient states she "fell at work in the bathroom on some water yesterday".   Patient states that she landed on R side.   Patient complains of R knee and R wrist pain.

## 2014-03-21 NOTE — ED Provider Notes (Signed)
CSN: [id number]     Arrival date & time 03/21/14  1010 History   This chart is scribed for non-physician practitioner, Al Corpus, PA-C, working with [place], DO by Chester Holstein, ED Scribe.  This patient was seen in room TR10C/TR10C and the patient's care was started 10:52 AM.      Chief Complaint  Patient presents with  . Wrist Pain  . Knee Pain    Patient is a 38 y.o. female presenting with wrist pain and knee pain. The history is provided by the patient. No language interpreter was used.  Wrist Pain  Knee Pain Associated symptoms: no fever     HPI Comments: [name] is a 38 y.o. female who presents to the Emergency Department complaining of right wrist and right knee pain with onset yesterday.  Pt notes she slipped on water in a bathroom at work and fell onto her right knee. Pt states she put her hand out to break her fall. Pt denies head injury or LOC. She notes associated swelling in her right knee yesterday which has resolved. No numbness, tingling, weakness in her right hand. She notes the pain worsened this morning. She took Tylenol and kept her feet propped up for relief.  Pt is able to ambulate with care. Pt denies PMHx of knee problems but has PMHx of lupus and HTN. Pt notes lupus flare ups includes feet and leg swelling, headaches and fever.  Pt takes Tramadol for relief. Pt denies fever or chills.    Past Medical History  Diagnosis Date  . Hypertension   . Cardiac arrhythmia   . SVT (supraventricular tachycardia)     "today, last week, 2 wk ago; maybe 1 month ago, etc; started w/in last 3-4 yrs"(07/20/2012)  . Fainting     "~ 1 month ago; probably related to SVT" (07/20/2012)  . Heart murmur     "small" (07/20/2012)  . Shortness of breath     "related to SVT episodes" (07/20/2012)  . Anemia     "today" (07/20/2012)  . Chest pain at rest     "related to SVT" (07/20/2012)  . Sleep apnea     "don't wear mask" (07/20/2012)  . Daily headache   . Anxiety    . Lupus (systemic lupus erythematosus)    Past Surgical History  Procedure Laterality Date  . Appendectomy  2011  . Tubal ligation  2010  . Cesarean section  2001; 2010  . Cervical biopsy  2013  . Ablation      07/21/12 Dr. Cristopher Peru    Family History  Problem Relation Age of Onset  . Cancer Mother     breast and ovarian  . BRCA 1/2 Sister    History  Substance Use Topics  . Smoking status: Never Smoker   . Smokeless tobacco: Never Used  . Alcohol Use: No     Comment: 07/20/2012 "might have a beer once/yr"   OB History    Gravida Para Term Preterm AB TAB SAB Ectopic Multiple Living   2 2 2  0 0 0 0 0 0 2     Review of Systems  Constitutional: Negative for fever and chills.  Musculoskeletal: Positive for joint swelling (resolved) and arthralgias.  Neurological: Negative for syncope and numbness.      Allergies  Other  Home Medications   Prior to Admission medications   Medication Sig Start Date End Date Taking? Authorizing Provider  acetaminophen (TYLENOL) 500 MG tablet Take 500 mg by mouth  every 6 (six) hours as needed.    Historical Provider, MD  alendronate (FOSAMAX) 70 MG tablet Take 70 mg by mouth once a week. Take on Wednesdays with a full glass of water on an empty stomach.    Historical Provider, MD  amLODipine (NORVASC) 10 MG tablet Take 1 tablet (10 mg total) by mouth daily. 11/15/12   Oswald Hillock, MD  calcium carbonate (OS-CAL) 1250 MG chewable tablet Chew 1 tablet by mouth daily as needed for heartburn.    Historical Provider, MD  dicyclomine (BENTYL) 20 MG tablet Take 1 tablet (20 mg total) by mouth 2 (two) times daily. 01/07/14   Robyn M Hess, PA-C  enalapril (VASOTEC) 10 MG tablet Take 10 mg by mouth daily.    Historical Provider, MD  HYDROcodone-acetaminophen (NORCO/VICODIN) 5-325 MG per tablet Take 1 tablet by mouth every 4 (four) hours as needed for moderate pain or severe pain. 03/21/14   Pura Spice, PA-C  mycophenolate (CELLCEPT) 500 MG  tablet Take 1,000 mg by mouth 2 (two) times daily.     Historical Provider, MD  ondansetron (ZOFRAN ODT) 4 MG disintegrating tablet 5m ODT q4 hours prn nausea/vomit 01/07/14   Robyn M Hess, PA-C  predniSONE (DELTASONE) 10 MG tablet Take 5 mg by mouth daily with breakfast.     Historical Provider, MD  PRESCRIPTION MEDICATION Apply 5 mLs topically 4 (four) times daily as needed (Thrush). Magic Mouthwash    Historical Provider, MD  traMADol (ULTRAM) 50 MG tablet Take by mouth daily as needed for moderate pain.    Historical Provider, MD   BP 147/94 mmHg  Pulse 85  Temp(Src) 98.7 F (37.1 C) (Oral)  Resp 16  Ht 5' 3"  (1.6 m)  Wt 192 lb (87.091 kg)  BMI 34.02 kg/m2  SpO2 96%  LMP 03/18/2014 Physical Exam  Constitutional: She appears well-developed and well-nourished. No distress.  HENT:  Head: Normocephalic and atraumatic.  Eyes: Conjunctivae are normal. Right eye exhibits no discharge. Left eye exhibits no discharge.  Cardiovascular:  Pulses:      Radial pulses are 2+ on the right side, and 2+ on the left side.       Dorsalis pedis pulses are 2+ on the right side, and 2+ on the left side.       Posterior tibial pulses are 2+ on the right side, and 2+ on the left side.  Pulmonary/Chest: Effort normal. No respiratory distress.  Musculoskeletal: Normal range of motion.       Right wrist: She exhibits tenderness (over right ulnar side). She exhibits normal range of motion, no swelling and no effusion.       Right knee: She exhibits no effusion, no LCL laxity and no MCL laxity. Tenderness (anterior over patella) found.  Negative anterior and posterior drawer test Wrist:  No ligamentous laxity No redness or warmth of fingers, hand or wrist.   Neurological: She is alert. Coordination normal.  5/5 strength in upper and lower extremities equal bilaterally. Pt notes decreased sensation in right fingertips compared to left.    Skin: She is not diaphoretic.  Psychiatric: She has a normal  mood and affect. Her behavior is normal.  Nursing note and vitals reviewed.   ED Course  Procedures (including critical care time) DIAGNOSTIC STUDIES: Oxygen Saturation is 100% on room air, normal by my interpretation.    COORDINATION OF CARE: 10:59 AM Discussed treatment plan with patient at beside, the patient agrees with the plan and has no further questions  at this time.   Labs Review Labs Reviewed - No data to display  Imaging Review Dg Wrist Complete Right  03/21/2014   CLINICAL DATA:  [place] today, pain.  EXAM: RIGHT WRIST - COMPLETE 3+ VIEW  COMPARISON:  None.  FINDINGS: There is no evidence of fracture or dislocation. There is no evidence of arthropathy or other focal bone abnormality. Soft tissues are unremarkable.  IMPRESSION: Negative.   Electronically Signed   By: Rolla Flatten M.D.   On: 03/21/2014 11:30   Dg Knee Complete 4 Views Right  03/21/2014   CLINICAL DATA:  Right knee pain after fall.  Pain at patella.  EXAM: RIGHT KNEE - COMPLETE 4+ VIEW  COMPARISON:  None.  FINDINGS: No fracture or dislocation. The alignment and joint spaces are maintained. Tiny diminutive tricompartmental spurs. Question small joint effusion. There is anterior soft tissue edema.  IMPRESSION: Anterior soft tissue edema and questionable joint effusion. No fracture or dislocation.   Electronically Signed   By: Jeb Levering M.D.   On: 03/21/2014 11:30     EKG Interpretation None      MDM   Final diagnoses:  Pain  Fall, initial encounter  Right knee pain  Right wrist pain   Patient X-Ray negative for obvious fracture or dislocation. Pain managed in ED. Pt without erythema, edema or warmth to joint. I doubt septic arthritis. Pt advised to follow up with PCP/orthopedics if symptoms persist. Patient given brace while in ED, RICE, tylenol and conservative therapy recommended and discussed. Short script for narcotic pain medications. Driving and sedation precautions provided.  Discussed return  precautions with patient. Discussed all results and patient verbalizes understanding and agrees with plan.  I personally performed the services described in this documentation, which was scribed in my presence. The recorded information has been reviewed and is accurate.    Pura Spice, PA-C 03/21/14 [place], DO 03/21/14 [phone]

## 2014-06-08 ENCOUNTER — Emergency Department (HOSPITAL_COMMUNITY)
Admission: EM | Admit: 2014-06-08 | Discharge: 2014-06-08 | Disposition: A | Discharge status: Home / Self Care | Payer: Medicaid Other | Source: Home / Self Care | Attending: Family Medicine | Admitting: Family Medicine

## 2014-06-08 ENCOUNTER — Encounter (HOSPITAL_COMMUNITY): Payer: Self-pay

## 2014-06-08 DIAGNOSIS — L03011 Cellulitis of right finger: Secondary | ICD-10-CM | POA: Diagnosis not present

## 2014-06-08 MED ORDER — HYDROCODONE-ACETAMINOPHEN 5-325 MG PO TABS: 2.0000 | ORAL_TABLET | ORAL | Status: DC | PRN

## 2014-06-08 MED ORDER — SULFAMETHOXAZOLE-TRIMETHOPRIM 800-160 MG PO TABS
1.0000 | ORAL_TABLET | Freq: Two times a day (BID) | ORAL | Status: DC
Start: 2014-06-08 — End: 2014-08-29

## 2014-06-08 MED ORDER — TETANUS-DIPHTH-ACELL PERTUSSIS 5-2.5-18.5 LF-MCG/0.5 IM SUSP
0.5000 mL | Freq: Once | INTRAMUSCULAR | Status: AC
  Administered 2014-06-08: 0.5 mL via INTRAMUSCULAR

## 2014-06-08 MED ORDER — TETANUS-DIPHTH-ACELL PERTUSSIS 5-2.5-18.5 LF-MCG/0.5 IM SUSP
INTRAMUSCULAR | Status: AC
  Filled 2014-06-08: qty 0.5

## 2014-06-08 NOTE — Discharge Instructions (Signed)

## 2014-06-08 NOTE — ED Notes (Signed)
States she sustained minor injury to finger a few days ago, and since then , has had pain, swelling, finger turning green and yellow

## 2014-06-08 NOTE — ED Provider Notes (Signed)
CSN: [id number]     Arrival date & time 06/08/14  0913 History   First MD Initiated Contact with Patient 06/08/14 1029     Chief Complaint  Patient presents with  . Finger Injury   (Consider location/radiation/quality/duration/timing/severity/associated sxs/prior Treatment) Patient is a 39 y.o. female presenting with hand pain. The history is provided by the patient. No language interpreter was used.  Hand Pain This is a new problem. The current episode started more than 2 days ago. The problem occurs constantly. The problem has been gradually worsening. Nothing aggravates the symptoms. Nothing relieves the symptoms. She has tried nothing for the symptoms. The treatment provided no relief.    Past Medical History  Diagnosis Date  . Hypertension   . Cardiac arrhythmia   . SVT (supraventricular tachycardia)     "today, last week, 2 wk ago; maybe 1 month ago, etc; started w/in last 3-4 yrs"(07/20/2012)  . Fainting     "~ 1 month ago; probably related to SVT" (07/20/2012)  . Heart murmur     "small" (07/20/2012)  . Shortness of breath     "related to SVT episodes" (07/20/2012)  . Anemia     "today" (07/20/2012)  . Chest pain at rest     "related to SVT" (07/20/2012)  . Sleep apnea     "don't wear mask" (07/20/2012)  . Daily headache   . Anxiety   . Lupus (systemic lupus erythematosus)   . Renal disorder   . Chronic renal failure, stage 4 (severe)    Past Surgical History  Procedure Laterality Date  . Appendectomy  2011  . Tubal ligation  2010  . Cesarean section  2001; 2010  . Cervical biopsy  2013  . Ablation      07/21/12 Dr. Cristopher Peru    Family History  Problem Relation Age of Onset  . Cancer Mother     breast and ovarian  . BRCA 1/2 Sister    History  Substance Use Topics  . Smoking status: Never Smoker   . Smokeless tobacco: Never Used  . Alcohol Use: No     Comment: 07/20/2012 "might have a beer once/yr"   OB History    Gravida Para Term Preterm AB TAB SAB  Ectopic Multiple Living   _0 0 0 0 0 0 0 2     Review of Systems  Skin: Positive for wound.  All other systems reviewed and are negative.   Allergies  Other  Home Medications   Prior to Admission medications   Medication Sig Start Date End Date Taking? Authorizing Provider  acetaminophen (TYLENOL) 500 MG tablet Take 500 mg by mouth every 6 (six) hours as needed.   Yes Historical Provider, MD  alendronate (FOSAMAX) 70 MG tablet Take 70 mg by mouth once a week. Take on Wednesdays with a full glass of water on an empty stomach.   Yes Historical Provider, MD  amLODipine (NORVASC) 10 MG tablet Take 1 tablet (10 mg total) by mouth daily. 11/15/12  Yes Oswald Hillock, MD  calcium carbonate (OS-CAL) 1250 MG chewable tablet Chew 1 tablet by mouth daily as needed for heartburn.   Yes Historical Provider, MD  dicyclomine (BENTYL) 20 MG tablet Take 1 tablet (20 mg total) by mouth 2 (two) times daily. 01/07/14  Yes Illene Labrador, PA-C  enalapril (VASOTEC) 10 MG tablet Take 10 mg by mouth daily.   Yes Historical Provider, MD  mycophenolate (CELLCEPT) 500 MG tablet Take 1,000 mg by  mouth 2 (two) times daily.    Yes Historical Provider, MD  ondansetron (ZOFRAN ODT) 4 MG disintegrating tablet 21m ODT q4 hours prn nausea/vomit 01/07/14  Yes RIllene Labrador PA-C  HYDROcodone-acetaminophen (NORCO/VICODIN) 5-325 MG per tablet Take 2 tablets by mouth every 4 (four) hours as needed. 06/08/14   LFransico Meadow PA-C  predniSONE (DELTASONE) 10 MG tablet Take 5 mg by mouth daily with breakfast.     Historical Provider, MD  PRESCRIPTION MEDICATION Apply 5 mLs topically 4 (four) times daily as needed (Thrush). Magic Mouthwash    Historical Provider, MD  sulfamethoxazole-trimethoprim (SEPTRA DS) 800-160 MG per tablet Take 1 tablet by mouth every 12 (twelve) hours. 06/08/14   LFransico Meadow PA-C  traMADol (ULTRAM) 50 MG tablet Take by mouth daily as needed for moderate pain.    Historical Provider, MD   BP 158/97  mmHg  Pulse 109  Temp(Src) 99.3 F (37.4 C) (Oral)  Resp 16  SpO2 100% Physical Exam  Constitutional: She is oriented to person, place, and time. She appears well-developed and well-nourished.  Musculoskeletal: She exhibits tenderness.  Paronychia distal finger,  nv and ns intact  Neurological: She is alert and oriented to person, place, and time.  Skin: Skin is warm.  Psychiatric: She has a normal mood and affect.    ED Course  INCISION AND DRAINAGE Date/Time: 06/08/2014 11:00 AM Performed by: SFransico MeadowAuthorized by: KIhor GullyD Consent: Verbal consent not obtained. Consent given by: patient Patient identity confirmed: verbally with patient Type: abscess Body area: upper extremity Local anesthetic: co-phenylcaine spray Patient sedated: no Scalpel size: 11 Incision type: single straight Complexity: simple Drainage: purulent Wound treatment: wound left open Patient tolerance: Patient tolerated the procedure well with no immediate complications   (including critical care time) Labs Review Labs Reviewed  WOUND CULTURE    Imaging Review No results found. Meds ordered this encounter  Medications  . Tdap (BOOSTRIX) injection 0.5 mL    Sig:   . HYDROcodone-acetaminophen (NORCO/VICODIN) 5-325 MG per tablet    Sig: Take 2 tablets by mouth every 4 (four) hours as needed.    Dispense:  10 tablet    Refill:  0    Order Specific Question:  Supervising Provider    Answer:  KBilly Fischer[[phone] . sulfamethoxazole-trimethoprim (SEPTRA DS) 800-160 MG per tablet    Sig: Take 1 tablet by mouth every 12 (twelve) hours.    Dispense:  14 tablet    Refill:  0    Order Specific Question:  Supervising Provider    Answer:  KBilly Fischer[[phone]   MDM   1. Paronychia, right    Bactrim Hydrocodone     LFransico Meadow PA-C 06/08/14 [place]/13[place]16 [phone]

## 2014-06-12 LAB — WOUND CULTURE: GRAM STAIN: NONE SEEN

## 2014-06-12 NOTE — ED Notes (Signed)
Wound culture: Mod. Staph. Aureus. Pt. adequately treated with I and D and Septra DS.

## 2014-06-28 DIAGNOSIS — R079 Chest pain, unspecified: Secondary | ICD-10-CM | POA: Diagnosis present

## 2014-06-28 DIAGNOSIS — R091 Pleurisy: Secondary | ICD-10-CM | POA: Diagnosis not present

## 2014-06-28 DIAGNOSIS — Z8669 Personal history of other diseases of the nervous system and sense organs: Secondary | ICD-10-CM | POA: Diagnosis not present

## 2014-06-28 DIAGNOSIS — Z79899 Other long term (current) drug therapy: Secondary | ICD-10-CM | POA: Diagnosis not present

## 2014-06-28 DIAGNOSIS — I129 Hypertensive chronic kidney disease with stage 1 through stage 4 chronic kidney disease, or unspecified chronic kidney disease: Secondary | ICD-10-CM | POA: Insufficient documentation

## 2014-06-28 DIAGNOSIS — N184 Chronic kidney disease, stage 4 (severe): Secondary | ICD-10-CM | POA: Diagnosis not present

## 2014-06-28 DIAGNOSIS — M329 Systemic lupus erythematosus, unspecified: Secondary | ICD-10-CM | POA: Insufficient documentation

## 2014-06-28 DIAGNOSIS — F419 Anxiety disorder, unspecified: Secondary | ICD-10-CM | POA: Diagnosis not present

## 2014-06-28 DIAGNOSIS — R011 Cardiac murmur, unspecified: Secondary | ICD-10-CM | POA: Insufficient documentation

## 2014-06-28 DIAGNOSIS — R0602 Shortness of breath: Secondary | ICD-10-CM | POA: Insufficient documentation

## 2014-06-28 DIAGNOSIS — Z862 Personal history of diseases of the blood and blood-forming organs and certain disorders involving the immune mechanism: Secondary | ICD-10-CM | POA: Insufficient documentation

## 2014-06-29 ENCOUNTER — Emergency Department (HOSPITAL_COMMUNITY): Payer: Medicaid Other

## 2014-06-29 ENCOUNTER — Emergency Department (HOSPITAL_COMMUNITY)
Admission: EM | Admit: 2014-06-29 | Discharge: 2014-06-29 | Disposition: A | Discharge status: Home / Self Care | Payer: Medicaid Other | Attending: Emergency Medicine | Admitting: Emergency Medicine

## 2014-06-29 ENCOUNTER — Encounter (HOSPITAL_COMMUNITY): Payer: Self-pay | Admitting: Emergency Medicine

## 2014-06-29 DIAGNOSIS — R091 Pleurisy: Secondary | ICD-10-CM

## 2014-06-29 DIAGNOSIS — M329 Systemic lupus erythematosus, unspecified: Secondary | ICD-10-CM

## 2014-06-29 DIAGNOSIS — R0602 Shortness of breath: Secondary | ICD-10-CM

## 2014-06-29 DIAGNOSIS — R0789 Other chest pain: Secondary | ICD-10-CM

## 2014-06-29 LAB — BASIC METABOLIC PANEL
ANION GAP: 12 (ref 5–15)
BUN: 25 mg/dL — ABNORMAL HIGH (ref 6–23)
CO2: 24 mmol/L (ref 19–32)
Calcium: 9.2 mg/dL (ref 8.4–10.5)
Chloride: 103 mmol/L (ref 96–112)
Creatinine, Ser: 1.88 mg/dL — ABNORMAL HIGH (ref 0.50–1.10)
GFR calc Af Amer: 38 mL/min — ABNORMAL LOW (ref >90)
GFR, EST NON AFRICAN AMERICAN: 33 mL/min — AB (ref >90)
Glucose, Bld: 94 mg/dL (ref 70–99)
POTASSIUM: 3.4 mmol/L — AB (ref 3.5–5.1)
SODIUM: 139 mmol/L (ref 135–145)

## 2014-06-29 LAB — CBC
HEMATOCRIT: 36.8 % (ref 36.0–46.0)
Hemoglobin: 11.7 g/dL — ABNORMAL LOW (ref 12.0–15.0)
MCH: 29.5 pg (ref 26.0–34.0)
MCHC: 31.8 g/dL (ref 30.0–36.0)
MCV: 92.7 fL (ref 78.0–100.0)
PLATELETS: 334 10*3/uL (ref 150–400)
RBC: 3.97 MIL/uL (ref 3.87–5.11)
RDW: 13.4 % (ref 11.5–15.5)
WBC: 7.9 10*3/uL (ref 4.0–10.5)

## 2014-06-29 LAB — I-STAT TROPONIN, ED
TROPONIN I, POC: 0.02 ng/mL (ref 0.00–0.08)
Troponin i, poc: 0.02 ng/mL (ref 0.00–0.08)

## 2014-06-29 LAB — BRAIN NATRIURETIC PEPTIDE: B NATRIURETIC PEPTIDE 5: 191.9 pg/mL — AB (ref 0.0–100.0)

## 2014-06-29 LAB — D-DIMER, QUANTITATIVE (NOT AT ARMC): D DIMER QUANT: 0.46 ug{FEU}/mL (ref 0.00–0.48)

## 2014-06-29 MED ORDER — PREDNISONE 20 MG PO TABS
60.0000 mg | ORAL_TABLET | Freq: Once | ORAL | Status: AC
  Administered 2014-06-29: 60 mg via ORAL
  Filled 2014-06-29: qty 3

## 2014-06-29 MED ORDER — NITROGLYCERIN 0.4 MG SL SUBL
0.4000 mg | SUBLINGUAL_TABLET | SUBLINGUAL | Status: DC | PRN
  Administered 2014-06-29 (×2): 0.4 mg via SUBLINGUAL

## 2014-06-29 MED ORDER — TRAMADOL HCL 50 MG PO TABS: 50.0000 mg | ORAL_TABLET | Freq: Four times a day (QID) | ORAL | Status: DC | PRN

## 2014-06-29 MED ORDER — PREDNISONE 50 MG PO TABS: 50.0000 mg | ORAL_TABLET | Freq: Every day | ORAL | Status: DC

## 2014-06-29 MED ORDER — ASPIRIN 81 MG PO CHEW
324.0000 mg | CHEWABLE_TABLET | Freq: Once | ORAL | Status: AC
  Administered 2014-06-29: 324 mg via ORAL
  Filled 2014-06-29: qty 4

## 2014-06-29 MED ORDER — FENTANYL CITRATE 0.05 MG/ML IJ SOLN
50.0000 ug | Freq: Once | INTRAMUSCULAR | Status: AC
  Administered 2014-06-29: 50 ug via INTRAVENOUS
  Filled 2014-06-29: qty 2

## 2014-06-29 NOTE — Discharge Instructions (Signed)
We think that you have a lupus flair up. Please take the medicine prescribed, and see your doctor in 5 days.  As discussed, lupus can put you at high risk for other emergent condition, thus come back to the ER if the symptoms are getting worse, you have difficulty in breathing, sweats, nausea.   Pleurisy Pleurisy is an inflammation and swelling of the lining of the lungs (pleura). Because of this inflammation, it hurts to breathe. It can be aggravated by coughing, laughing, or deep breathing. Pleurisy is often caused by an underlying infection or disease.  HOME CARE INSTRUCTIONS  Monitor your pleurisy for any changes. The following actions may help to alleviate any discomfort you are experiencing:  Medicine may help with pain. Only take over-the-counter or prescription medicines for pain, discomfort, or fever as directed by your health care provider.  Only take antibiotic medicine as directed. Make sure to finish it even if you start to feel better. SEEK MEDICAL CARE IF:   Your pain is not controlled with medicine or is increasing.  You have an increase in pus-like (purulent) secretions brought up with coughing. SEEK IMMEDIATE MEDICAL CARE IF:   You have blue or dark lips, fingernails, or toenails.  You are coughing up blood.  You have increased difficulty breathing.  You have continuing pain unrelieved by medicine or pain lasting more than 1 week.  You have pain that radiates into your neck, arms, or jaw.  You develop increased shortness of breath or wheezing.  You develop a fever, rash, vomiting, fainting, or other serious symptoms. MAKE SURE YOU:  Understand these instructions.   Will watch your condition.   Will get help right away if you are not doing well or get worse.  Document Released: 03/16/2005 Document Revised: 11/16/2012 Document Reviewed: 08/28/2012 [facility] Patient Information 2015 [place], [place]. This information is not intended to replace advice given  to you by your health care provider. Make sure you discuss any questions you have with your health care provider.  Lupus Lupus (also called systemic lupus erythematosus, SLE) is a disorder of the body's natural defense system (immune system). In lupus, the immune system attacks various areas of the body (autoimmune disease). CAUSES The cause is unknown. However, lupus runs in families. Certain genes can make you more likely to develop lupus. It is 10 times more common in women than in men. Lupus is also more common in African Americans and Asians. Other factors also play a role, such as viruses (Epstein-Barr virus, EBV), stress, hormones, cigarette smoke, and certain drugs. SYMPTOMS Lupus can affect many parts of the body, including the joints, skin, kidneys, lungs, heart, nervous system, and blood vessels. The signs and symptoms of lupus differ from person to person. The disease can range from mild to life-threatening. Typical features of lupus include:  Butterfly-shaped rash over the face.  Arthritis involving one or more joints.  Kidney disease.  Fever, weight loss, hair loss, fatigue.  Poor circulation in the fingers and toes (Raynaud's disease).  Chest pain when taking deep breaths. Abdominal pain may also occur.  Skin rash in areas exposed to the sun.  Sores in the mouth and nose. DIAGNOSIS Diagnosing lupus can take a long time and is often difficult. An exam and an accurate account of your symptoms and health problems is very important. Blood tests are necessary, though no single test can confirm or rule out lupus. Most people with lupus test positive for antinuclear antibodies (ANA) on a blood test. Additional blood  tests, a urine test (urinalysis), and sometimes a kidney or skin tissue sample (biopsy) can help to confirm or rule out lupus. TREATMENT There is no cure for lupus. Your caregiver will develop a treatment plan based on your age, sex, health, symptoms, and lifestyle. The  goals are to prevent flares, to treat them when they do occur, and to minimize organ damage and complications. How the disease may affect each person varies widely. Most people with lupus can live normal lives, but this disorder must be carefully monitored. Treatment must be adjusted as necessary to prevent serious complications. Medicines used for treatment:  Nonsteroidal anti-inflammatory drugs (NSAIDs) decrease inflammation and can help with chest pain, joint pain, and fevers. Examples include ibuprofen and naproxen.  Antimalarial drugs were designed to treat malaria. They also treat fatigue, joint pain, skin rashes, and inflammation of the lungs in patients with lupus.  Corticosteroids are powerful hormones that rapidly suppress inflammation. The lowest dose with the highest benefit will be chosen. They can be given by cream, pills, injections, and through the vein (intravenously).  Immunosuppressive drugs block the making of immune cells. They may be used for kidney or nerve disease. HOME CARE INSTRUCTIONS  Exercise. Low-impact activities can usually help keep joints flexible without being too strenuous.  Rest after periods of exercise.  Avoid excessive sun exposure.  Follow proper nutrition and take supplements as recommended by your caregiver.  Stress management can be helpful. SEEK MEDICAL CARE IF:  You have increased fatigue.  You develop pain.  You develop a rash.  You have an oral temperature above 102 F (38.9 C).  You develop abdominal discomfort.  You develop a headache.  You experience dizziness. [place] [facility]rological Disorders and Stroke: [employer] of Rheumatology: www.rheumatology.[facility] and Musculoskeletal and Skin Diseases: www.niams.[employer].es Document Released: 03/06/2002 Document Revised: 06/08/2011 Document Reviewed: 06/27/2009 [facility] Patient Information 2015  [place], [place]. This information is not intended to replace advice given to you by your health care provider. Make sure you discuss any questions you have with your health care provider.

## 2014-06-29 NOTE — ED Notes (Signed)
Pt. reports intermittent palpitations with chest tightness , SOB and jaw pain onset yesterday , denies nausea or diaphoresis .

## 2014-06-29 NOTE — ED Provider Notes (Signed)
CSN: [id number]     Arrival date & time 06/28/14  2357 History  This chart was scribed for Varney Biles, MD by Eustaquio Maize, ED Scribe. This patient was seen in room A01C/A01C and the patient's care was started at 1:05 AM.      Chief Complaint  Patient presents with  . Chest Pain   The history is provided by the patient. No language interpreter was used.     HPI Comments: [name] is a 39 y.o. female with PMHx Lupus, HTN, and Acute Kidney Failure who presents to the Emergency Department complaining of intermittent chest tightness that began 1 day ago. Pt was sitting down when the pain began.Pt describes the pain as pressure type pain, intermittent. She states that the pain stays longer each time it returns. The pain fluxuates between lasting a few minutes to 1-2 minutes.  Pain is in the front of her chest and radiates to the anterior neck/jaw area bilaterally and it is worse with inspiration and cough. She also complains of dizziness, lightheadedness, shortness of breath, fatigue, and bilateral jaw pain upon deep breathing. She mentions that on her way to get her CXR, she began having back pain as well. Pt does admit to chronic dyspnea on exertion. Pt has had similar symptoms in the past but has not been evaluated by her cardiologist for these symptoms. Pt denies leg swelling, cough, palpitations, diaphoresis, nausea, vomiting, or any other symptoms.    Past Medical History  Diagnosis Date  . Hypertension   . Cardiac arrhythmia   . SVT (supraventricular tachycardia)     "today, last week, 2 wk ago; maybe 1 month ago, etc; started w/in last 3-4 yrs"(07/20/2012)  . Fainting     "~ 1 month ago; probably related to SVT" (07/20/2012)  . Heart murmur     "small" (07/20/2012)  . Shortness of breath     "related to SVT episodes" (07/20/2012)  . Anemia     "today" (07/20/2012)  . Chest pain at rest     "related to SVT" (07/20/2012)  . Sleep apnea     "don't wear mask" (07/20/2012)  . Daily  headache   . Anxiety   . Lupus (systemic lupus erythematosus)   . Renal disorder   . Chronic renal failure, stage 4 (severe)    Past Surgical History  Procedure Laterality Date  . Appendectomy  2011  . Tubal ligation  2010  . Cesarean section  2001; 2010  . Cervical biopsy  2013  . Ablation      07/21/12 Dr. Cristopher Peru    Family History  Problem Relation Age of Onset  . Cancer Mother     breast and ovarian  . BRCA 1/2 Sister    History  Substance Use Topics  . Smoking status: Never Smoker   . Smokeless tobacco: Never Used  . Alcohol Use: No     Comment: 07/20/2012 "might have a beer once/yr"   OB History    Gravida Para Term Preterm AB TAB SAB Ectopic Multiple Living   _0 0 0 0 0 0 0 2     Review of Systems  Constitutional: Positive for fever. Negative for diaphoresis.  HENT:       Jaw pain.   Respiratory: Positive for chest tightness and shortness of breath. Negative for cough.        Chronic dyspnea on exertion.   Cardiovascular: Negative for palpitations and leg swelling.       Positive  for tachycardia and hypertension.   Gastrointestinal: Negative for nausea and vomiting.  Musculoskeletal:       LLE muscle spasms.   Neurological: Positive for dizziness and light-headedness.  All other systems reviewed and are negative.     Allergies  Other  Home Medications   Prior to Admission medications   Medication Sig Start Date End Date Taking? Authorizing Provider  acetaminophen (TYLENOL) 500 MG tablet Take 500 mg by mouth every 6 (six) hours as needed.    Historical Provider, MD  alendronate (FOSAMAX) 70 MG tablet Take 70 mg by mouth once a week. Take on Wednesdays with a full glass of water on an empty stomach.    Historical Provider, MD  amLODipine (NORVASC) 10 MG tablet Take 1 tablet (10 mg total) by mouth daily. 11/15/12   Oswald Hillock, MD  calcium carbonate (OS-CAL) 1250 MG chewable tablet Chew 1 tablet by mouth daily as needed for heartburn.     Historical Provider, MD  dicyclomine (BENTYL) 20 MG tablet Take 1 tablet (20 mg total) by mouth 2 (two) times daily. 01/07/14   Robyn M Hess, PA-C  enalapril (VASOTEC) 10 MG tablet Take 10 mg by mouth daily.    Historical Provider, MD  HYDROcodone-acetaminophen (NORCO/VICODIN) 5-325 MG per tablet Take 2 tablets by mouth every 4 (four) hours as needed. 06/08/14   Fransico Meadow, PA-C  mycophenolate (CELLCEPT) 500 MG tablet Take 1,000 mg by mouth 2 (two) times daily.     Historical Provider, MD  ondansetron (ZOFRAN ODT) 4 MG disintegrating tablet 67m ODT q4 hours prn nausea/vomit 01/07/14   Robyn M Hess, PA-C  predniSONE (DELTASONE) 10 MG tablet Take 5 mg by mouth daily with breakfast.     Historical Provider, MD  PRESCRIPTION MEDICATION Apply 5 mLs topically 4 (four) times daily as needed (Thrush). Magic Mouthwash    Historical Provider, MD  sulfamethoxazole-trimethoprim (SEPTRA DS) 800-160 MG per tablet Take 1 tablet by mouth every 12 (twelve) hours. 06/08/14   LFransico Meadow PA-C  traMADol (ULTRAM) 50 MG tablet Take by mouth daily as needed for moderate pain.    Historical Provider, MD   Triage Vitals: BP 165/95 mmHg  Pulse 102  Temp(Src) 98 F (36.7 C)  SpO2 100%  LMP 06/12/2014   Physical Exam  Constitutional: She is oriented to person, place, and time. She appears well-developed and well-nourished.  Non-toxic appearance. No distress.  HENT:  Head: Normocephalic and atraumatic.  Eyes: Conjunctivae, EOM and lids are normal. Pupils are equal, round, and reactive to light.  Neck: Normal range of motion. Neck supple. No JVD present. No tracheal deviation present. No thyroid mass present.  Cardiovascular: Normal rate, regular rhythm and normal heart sounds.  Exam reveals no gallop.   No murmur heard. Pulmonary/Chest: Effort normal and breath sounds normal. No stridor. No respiratory distress. She has no decreased breath sounds. She has no wheezes. She has no rhonchi. She has no rales.   Abdominal: Soft. Normal appearance and bowel sounds are normal. She exhibits no distension. There is no tenderness. There is no rebound and no CVA tenderness.  Musculoskeletal: Normal range of motion. She exhibits tenderness (Left sided calf tenderness. ). She exhibits no edema (No pitting edema. ).  No unilateral leg swelling.   Neurological: She is alert and oriented to person, place, and time. She has normal strength. No cranial nerve deficit or sensory deficit. GCS eye subscore is 4. GCS verbal subscore is 5. GCS motor subscore is 6.  Radial pulse bilaterally equal and 2+.   Skin: Skin is warm and dry. No abrasion and no rash noted.  Psychiatric: She has a normal mood and affect. Her speech is normal and behavior is normal.  Nursing note and vitals reviewed.   ED Course  Procedures (including critical care time)  DIAGNOSTIC STUDIES: Oxygen Saturation is 100% on RA, normal by my interpretation.    COORDINATION OF CARE: 1:15 AM-Discussed treatment plan which includes CXR, D-Dimer, CBC, BMP, BNP, Troponin with pt at bedside and pt agreed to plan.   Labs Review Labs Reviewed  CBC - Abnormal; Notable for the following:    Hemoglobin 11.7 (*)    All other components within normal limits  BASIC METABOLIC PANEL  BRAIN NATRIURETIC PEPTIDE  D-DIMER, QUANTITATIVE  I-STAT TROPOININ, ED    Imaging Review Dg Chest 2 View  06/29/2014   CLINICAL DATA:  Chest tightness and shortness of breath for 2 days.  EXAM: CHEST  2 VIEW  COMPARISON:  01/25/2013  FINDINGS: The cardiomediastinal contours are normal. Minimal ill-defined patchy opacity at the right lung base. Pulmonary vasculature is normal. No pleural effusion or pneumothorax. No acute osseous abnormalities are seen.  IMPRESSION: Minimal patchy ill-defined opacity at the right lung base, may reflect atelectasis or pneumonia in the appropriate clinical setting.   Electronically Signed   By: Jeb Levering M.D.   On: 06/29/2014 01:28     EKG  Interpretation   Date/Time:  Friday June 29 2014 00:17:00 EDT Ventricular Rate:  99 PR Interval:  136 QRS Duration: 74 QT Interval:  354 QTC Calculation: 454 R Axis:   17 Text Interpretation:  Normal sinus rhythm Nonspecific T wave abnormality  Abnormal ECG Nonspecific ST and T wave abnormality mild ST changes  compared to before Confirmed by Kathrynn Humble, MD, Denim Kalmbach [phone]) on 06/29/2014  1:14:09 AM     _0  am - pt reassessed. Explained the results of the initial workup. Explained the negative dimer study and the significant of the results. Also explained plans to get trops x 2. Pt explained that likely she has lupus flair up causing pleurisy. We discussed her age, lack of significant risk factors for ACS besides lupus, and how trops x 2 might be enough for the ACS workup. Discussed the limitations of the troponin and dimer - and so STRICT RETURN PRECAUTIONS HAVE BEEN DISCUSSED. Patient agrees with the plan and is comfortable with it. MDM   Final diagnoses:  Chest tightness  SOB (shortness of breath)    I personally performed the services described in this documentation, which was scribed in my presence. The recorded information has been reviewed and is accurate.  Pt comes in with cc of dib and chest pain. Pt has SLE. Pt is 37, she is not a smoker. Pain is atypical for ACS, given the pleuritic nature to it. Pain is not exertional either. Pt is at higher risk for ACS given the SLE hx. HEAR score is 2 - 1 for hx and 1 for risk factors - we will get troponin x 2. EKG has some non specific changes, but nothing acute, and no significant changes noted compared to prior With the pleuritic pain and some Leg cramping - Dimer ordered, and is neg.  Initial impression is ACS, PE and Lupus flair up, with the latter being highest on the differential diagnosis.    Varney Biles, MD 06/29/14 [phone]

## 2014-08-20 ENCOUNTER — Other Ambulatory Visit (HOSPITAL_COMMUNITY): Payer: Self-pay | Admitting: Nephrology

## 2014-08-20 DIAGNOSIS — M3214 Glomerular disease in systemic lupus erythematosus: Secondary | ICD-10-CM

## 2014-08-23 ENCOUNTER — Other Ambulatory Visit: Payer: Self-pay | Admitting: Radiology

## 2014-08-24 ENCOUNTER — Ambulatory Visit (HOSPITAL_COMMUNITY): Admission: RE | Admit: 2014-08-24 | Payer: Medicaid Other | Source: Ambulatory Visit

## 2014-08-29 ENCOUNTER — Encounter (HOSPITAL_COMMUNITY): Payer: Self-pay | Admitting: Emergency Medicine

## 2014-08-29 ENCOUNTER — Emergency Department (HOSPITAL_COMMUNITY)
Admission: EM | Admit: 2014-08-29 | Discharge: 2014-08-29 | Disposition: A | Discharge status: Home / Self Care | Payer: Medicaid Other | Attending: Emergency Medicine | Admitting: Emergency Medicine

## 2014-08-29 DIAGNOSIS — I129 Hypertensive chronic kidney disease with stage 1 through stage 4 chronic kidney disease, or unspecified chronic kidney disease: Secondary | ICD-10-CM | POA: Diagnosis not present

## 2014-08-29 DIAGNOSIS — M545 Low back pain, unspecified: Secondary | ICD-10-CM

## 2014-08-29 DIAGNOSIS — N184 Chronic kidney disease, stage 4 (severe): Secondary | ICD-10-CM | POA: Insufficient documentation

## 2014-08-29 DIAGNOSIS — G4730 Sleep apnea, unspecified: Secondary | ICD-10-CM | POA: Diagnosis not present

## 2014-08-29 DIAGNOSIS — R011 Cardiac murmur, unspecified: Secondary | ICD-10-CM | POA: Insufficient documentation

## 2014-08-29 DIAGNOSIS — Z7952 Long term (current) use of systemic steroids: Secondary | ICD-10-CM | POA: Insufficient documentation

## 2014-08-29 DIAGNOSIS — Z862 Personal history of diseases of the blood and blood-forming organs and certain disorders involving the immune mechanism: Secondary | ICD-10-CM | POA: Diagnosis not present

## 2014-08-29 DIAGNOSIS — F419 Anxiety disorder, unspecified: Secondary | ICD-10-CM | POA: Insufficient documentation

## 2014-08-29 LAB — URINE MICROSCOPIC-ADD ON

## 2014-08-29 LAB — BASIC METABOLIC PANEL
ANION GAP: 9 (ref 5–15)
BUN: 15 mg/dL (ref 6–20)
CHLORIDE: 105 mmol/L (ref 101–111)
CO2: 26 mmol/L (ref 22–32)
CREATININE: 1.66 mg/dL — AB (ref 0.44–1.00)
Calcium: 8.9 mg/dL (ref 8.9–10.3)
GFR calc Af Amer: 44 mL/min — ABNORMAL LOW (ref >60)
GFR calc non Af Amer: 38 mL/min — ABNORMAL LOW (ref >60)
Glucose, Bld: 90 mg/dL (ref 65–99)
POTASSIUM: 3.1 mmol/L — AB (ref 3.5–5.1)
SODIUM: 140 mmol/L (ref 135–145)

## 2014-08-29 LAB — URINALYSIS, ROUTINE W REFLEX MICROSCOPIC
Bilirubin Urine: NEGATIVE
Glucose, UA: NEGATIVE mg/dL
KETONES UR: NEGATIVE mg/dL
Leukocytes, UA: NEGATIVE
NITRITE: NEGATIVE
Protein, ur: >300 mg/dL — AB
Specific Gravity, Urine: 1.013 (ref 1.005–1.030)
Urobilinogen, UA: 0.2 mg/dL (ref 0.0–1.0)
pH: 6.5 (ref 5.0–8.0)

## 2014-08-29 LAB — CBC
HEMATOCRIT: 32.6 % — AB (ref 36.0–46.0)
Hemoglobin: 10.4 g/dL — ABNORMAL LOW (ref 12.0–15.0)
MCH: 29.5 pg (ref 26.0–34.0)
MCHC: 31.9 g/dL (ref 30.0–36.0)
MCV: 92.4 fL (ref 78.0–100.0)
Platelets: 328 10*3/uL (ref 150–400)
RBC: 3.53 MIL/uL — AB (ref 3.87–5.11)
RDW: 13.2 % (ref 11.5–15.5)
WBC: 3.5 10*3/uL — ABNORMAL LOW (ref 4.0–10.5)

## 2014-08-29 MED ORDER — HYDROCODONE-ACETAMINOPHEN 5-325 MG PO TABS: 1.0000 | ORAL_TABLET | ORAL | Status: DC | PRN

## 2014-08-29 MED ORDER — MORPHINE SULFATE 4 MG/ML IJ SOLN
4.0000 mg | Freq: Once | INTRAMUSCULAR | Status: AC
  Administered 2014-08-29: 4 mg via INTRAVENOUS
  Filled 2014-08-29: qty 1

## 2014-08-29 NOTE — ED Notes (Addendum)
IV was in left hand (not charted). D/C IV. Site was bleeding. Per discharge order.

## 2014-08-29 NOTE — ED Provider Notes (Signed)
CSN: [id number]     Arrival date & time 08/29/14  7782 History   First MD Initiated Contact with Patient 08/29/14 1002     Chief Complaint  Patient presents with  . Flank Pain     HPI Patient reports low back pain over the past several days.  She states she's never had pain like this.  She reports it is in the right paralumbar region more than anywhere else.  She is currently being managed as an outpatient for chronic renal insufficiency and is being followed by nephrology with the scheduled outpatient kidney biopsy as she has a history of lupus.  She denies abdominal pain.  No fevers or chills.  She denies rash.  No recent injury.  No heavy lifting or trauma.  She denies weakness in her legs or any numbness in her legs.  No dysuria or urinary frequency.   Past Medical History  Diagnosis Date  . Hypertension   . Cardiac arrhythmia   . SVT (supraventricular tachycardia)     "today, last week, 2 wk ago; maybe 1 month ago, etc; started w/in last 3-4 yrs"(07/20/2012)  . Fainting     "~ 1 month ago; probably related to SVT" (07/20/2012)  . Heart murmur     "small" (07/20/2012)  . Shortness of breath     "related to SVT episodes" (07/20/2012)  . Anemia     "today" (07/20/2012)  . Chest pain at rest     "related to SVT" (07/20/2012)  . Sleep apnea     "don't wear mask" (07/20/2012)  . Daily headache   . Anxiety   . Lupus (systemic lupus erythematosus)   . Renal disorder   . Chronic renal failure, stage 4 (severe)    Past Surgical History  Procedure Laterality Date  . Appendectomy  2011  . Tubal ligation  2010  . Cesarean section  2001; 2010  . Cervical biopsy  2013  . Ablation      07/21/12 Dr. Cristopher Peru    Family History  Problem Relation Age of Onset  . Cancer Mother     breast and ovarian  . BRCA 1/2 Sister    History  Substance Use Topics  . Smoking status: Never Smoker   . Smokeless tobacco: Never Used  . Alcohol Use: No     Comment: 07/20/2012 "might have a beer  once/yr"   OB History    Gravida Para Term Preterm AB TAB SAB Ectopic Multiple Living   _0 0 0 0 0 0 0 2     Review of Systems  All other systems reviewed and are negative.     Allergies  Food  Home Medications   Prior to Admission medications   Medication Sig Start Date End Date Taking? Authorizing Provider  acetaminophen (TYLENOL) 500 MG tablet Take 500 mg by mouth every 6 (six) hours as needed.   Yes Historical Provider, MD  amLODipine (NORVASC) 10 MG tablet Take 1 tablet (10 mg total) by mouth daily. 11/15/12  Yes Oswald Hillock, MD  calcium carbonate (TUMS EX) 750 MG chewable tablet Chew 1 tablet by mouth daily as needed for heartburn.    Yes Historical Provider, MD  cyclobenzaprine (FLEXERIL) 10 MG tablet Take 10 mg by mouth 3 (three) times daily as needed for muscle spasms.   Yes Historical Provider, MD  enalapril (VASOTEC) 20 MG tablet Take 20 mg by mouth daily. 08/03/14  Yes Historical Provider, MD  mycophenolate (CELLCEPT) 500 MG tablet  Take 1,500 mg by mouth 2 (two) times daily.    Yes Historical Provider, MD  predniSONE (DELTASONE) 10 MG tablet Take 10 mg by mouth daily with breakfast.    Yes Historical Provider, MD  PRESCRIPTION MEDICATION Apply 5 mLs topically 4 (four) times daily as needed (Thrush). Magic Mouthwash   Yes Historical Provider, MD  simvastatin (ZOCOR) 40 MG tablet Take 40 mg by mouth daily.   Yes Historical Provider, MD  traMADol (ULTRAM) 50 MG tablet Take 1 tablet (50 mg total) by mouth every 6 (six) hours as needed. 06/29/14  Yes Varney Biles, MD  alendronate (FOSAMAX) 70 MG tablet Take 70 mg by mouth once a week. Take on Wednesdays with a full glass of water on an empty stomach.    Historical Provider, MD  HYDROcodone-acetaminophen (NORCO/VICODIN) 5-325 MG per tablet Take 1 tablet by mouth every 4 (four) hours as needed for moderate pain. 08/29/14   Jola Schmidt, MD   BP 149/76 mmHg  Pulse 81  Temp(Src) 98.3 F (36.8 C) (Oral)  Resp 17  Ht _0  (1.6  m)  Wt 236 lb (107.049 kg)  BMI 41.82 kg/m2  SpO2 100%  LMP 08/29/2014 Physical Exam  Constitutional: She is oriented to person, place, and time. She appears well-developed and well-nourished. No distress.  HENT:  Head: Normocephalic and atraumatic.  Eyes: EOM are normal.  Neck: Normal range of motion.  Cardiovascular: Normal rate, regular rhythm and normal heart sounds.   Pulmonary/Chest: Effort normal and breath sounds normal.  Abdominal: Soft. She exhibits no distension. There is no tenderness.  Musculoskeletal: Normal range of motion.  Mild paralumbar tenderness without spasm on the right.  Neurological: She is alert and oriented to person, place, and time.  Skin: Skin is warm and dry.  Psychiatric: She has a normal mood and affect. Judgment normal.  Nursing note and vitals reviewed.   ED Course  Procedures (including critical care time) Labs Review Labs Reviewed  CBC - Abnormal; Notable for the following:    WBC 3.5 (*)    RBC 3.53 (*)    Hemoglobin 10.4 (*)    HCT 32.6 (*)    All other components within normal limits  BASIC METABOLIC PANEL - Abnormal; Notable for the following:    Potassium 3.1 (*)    Creatinine, Ser 1.66 (*)    GFR calc non Af Amer 38 (*)    GFR calc Af Amer 44 (*)    All other components within normal limits  URINALYSIS, ROUTINE W REFLEX MICROSCOPIC (NOT AT [facility]) - Abnormal; Notable for the following:    APPearance CLOUDY (*)    Hgb urine dipstick LARGE (*)    Protein, ur >300 (*)    All other components within normal limits  URINE MICROSCOPIC-ADD ON   BUN  Date Value Ref Range Status  08/29/2014 15 6 - 20 mg/dL Final  06/29/2014 25* 6 - 23 mg/dL Final  01/07/2014 11 6 - 23 mg/dL Final  10/05/2013 16 6 - 23 mg/dL Final   CREATININE, SER  Date Value Ref Range Status  08/29/2014 1.66* 0.44 - 1.00 mg/dL Final  06/29/2014 1.88* 0.50 - 1.10 mg/dL Final  01/07/2014 1.50* 0.50 - 1.10 mg/dL Final  10/05/2013 1.60* 0.50 - 1.10 mg/dL Final       Imaging Review No results found.   EKG Interpretation None      MDM   Final diagnoses:  Right-sided low back pain without sciatica    Urine without signs of  infection.  Doubt kidney problem.  I suspect this is more musculoskeletal low back pain.  No indication for advanced imaging.  No back pain red flags.  Discharge home in good condition.  Patient understands to return to the emergency department for new or worsening symptoms.  Primary care follow-up.    Jola Schmidt, MD 08/29/14 1331

## 2014-08-29 NOTE — ED Notes (Signed)
Patient states kidney problems.   Patient complains of flank pain bilaterally and states "my lupus has been agressively attacking my kidneys".  I have to have a biopsy of my kidney next week because of pain and spill over protein in my urine.

## 2014-08-31 ENCOUNTER — Other Ambulatory Visit: Payer: Self-pay | Admitting: Radiology

## 2014-09-03 ENCOUNTER — Encounter (HOSPITAL_COMMUNITY): Payer: Self-pay

## 2014-09-03 ENCOUNTER — Ambulatory Visit (HOSPITAL_COMMUNITY)
Admission: RE | Admit: 2014-09-03 | Discharge: 2014-09-03 | Disposition: A | Discharge status: Home / Self Care | Payer: Medicaid Other | Source: Ambulatory Visit | Attending: Nephrology | Admitting: Nephrology

## 2014-09-03 DIAGNOSIS — Z79899 Other long term (current) drug therapy: Secondary | ICD-10-CM | POA: Insufficient documentation

## 2014-09-03 DIAGNOSIS — G4730 Sleep apnea, unspecified: Secondary | ICD-10-CM | POA: Insufficient documentation

## 2014-09-03 DIAGNOSIS — M3214 Glomerular disease in systemic lupus erythematosus: Secondary | ICD-10-CM | POA: Insufficient documentation

## 2014-09-03 DIAGNOSIS — N184 Chronic kidney disease, stage 4 (severe): Secondary | ICD-10-CM | POA: Insufficient documentation

## 2014-09-03 DIAGNOSIS — Z7983 Long term (current) use of bisphosphonates: Secondary | ICD-10-CM | POA: Insufficient documentation

## 2014-09-03 DIAGNOSIS — I129 Hypertensive chronic kidney disease with stage 1 through stage 4 chronic kidney disease, or unspecified chronic kidney disease: Secondary | ICD-10-CM | POA: Diagnosis not present

## 2014-09-03 LAB — CBC
HEMATOCRIT: 33 % — AB (ref 36.0–46.0)
HEMOGLOBIN: 10.6 g/dL — AB (ref 12.0–15.0)
MCH: 29.9 pg (ref 26.0–34.0)
MCHC: 32.1 g/dL (ref 30.0–36.0)
MCV: 93.2 fL (ref 78.0–100.0)
PLATELETS: 309 10*3/uL (ref 150–400)
RBC: 3.54 MIL/uL — AB (ref 3.87–5.11)
RDW: 13.2 % (ref 11.5–15.5)
WBC: 4.3 10*3/uL (ref 4.0–10.5)

## 2014-09-03 LAB — APTT: aPTT: 26 seconds (ref 24–37)

## 2014-09-03 LAB — PROTIME-INR
INR: 0.88 (ref 0.00–1.49)
PROTHROMBIN TIME: 12.1 s (ref 11.6–15.2)

## 2014-09-03 MED ORDER — SODIUM CHLORIDE 0.9 % IV SOLN: Freq: Once | INTRAVENOUS | Status: DC

## 2014-09-03 MED ORDER — HYDROCODONE-ACETAMINOPHEN 5-325 MG PO TABS: 1.0000 | ORAL_TABLET | ORAL | Status: DC | PRN

## 2014-09-03 MED ORDER — MIDAZOLAM HCL 2 MG/2ML IJ SOLN
INTRAMUSCULAR | Status: AC
  Filled 2014-09-03: qty 6

## 2014-09-03 MED ORDER — LIDOCAINE HCL (PF) 1 % IJ SOLN
INTRAMUSCULAR | Status: AC
  Filled 2014-09-03: qty 10

## 2014-09-03 MED ORDER — SODIUM CHLORIDE 0.9 % IV SOLN
INTRAVENOUS | Status: AC | PRN
  Administered 2014-09-03: 10 mL/h via INTRAVENOUS

## 2014-09-03 MED ORDER — MIDAZOLAM HCL 2 MG/2ML IJ SOLN
INTRAMUSCULAR | Status: AC | PRN
Start: 2014-09-03 — End: 2014-09-03
  Administered 2014-09-03: 0.5 mg via INTRAVENOUS
  Administered 2014-09-03: 1 mg via INTRAVENOUS

## 2014-09-03 MED ORDER — GELATIN ABSORBABLE 12-7 MM EX MISC
CUTANEOUS | Status: AC
  Filled 2014-09-03: qty 1

## 2014-09-03 MED ORDER — FENTANYL CITRATE (PF) 100 MCG/2ML IJ SOLN
INTRAMUSCULAR | Status: AC | PRN
  Administered 2014-09-03: 25 ug via INTRAVENOUS
  Administered 2014-09-03: 50 ug via INTRAVENOUS

## 2014-09-03 MED ORDER — FENTANYL CITRATE (PF) 100 MCG/2ML IJ SOLN
INTRAMUSCULAR | Status: AC
  Filled 2014-09-03: qty 6

## 2014-09-03 NOTE — Procedures (Signed)
US guided core biopsies of left kidney lower pole.  3 cores obtained.  Minimal blood loss and no immediate complication.

## 2014-09-03 NOTE — H&P (Signed)
Chief Complaint: Worsening creatinine  Referring Physician(s): Powell,Alvin C  History of Present Illness: [name] is a 39 y.o. female   Hx lupus nephritis HTN Worsening creatinine Now scheduled for random renal bx   Past Medical History  Diagnosis Date  . Hypertension   . Cardiac arrhythmia   . SVT (supraventricular tachycardia)     "today, last week, 2 wk ago; maybe 1 month ago, etc; started w/in last 3-4 yrs"(07/20/2012)  . Fainting     "~ 1 month ago; probably related to SVT" (07/20/2012)  . Heart murmur     "small" (07/20/2012)  . Shortness of breath     "related to SVT episodes" (07/20/2012)  . Anemia     "today" (07/20/2012)  . Chest pain at rest     "related to SVT" (07/20/2012)  . Sleep apnea     "don't wear mask" (07/20/2012)  . Daily headache   . Anxiety   . Lupus (systemic lupus erythematosus)   . Renal disorder   . Chronic renal failure, stage 4 (severe)     Past Surgical History  Procedure Laterality Date  . Appendectomy  2011  . Tubal ligation  2010  . Cesarean section  2001; 2010  . Cervical biopsy  2013  . Ablation      07/21/12 Dr. Cristopher Peru     Allergies: Food  Medications: Prior to Admission medications   Medication Sig Start Date End Date Taking? Authorizing Provider  acetaminophen (TYLENOL) 500 MG tablet Take 500 mg by mouth every 6 (six) hours as needed.   Yes Historical Provider, MD  alendronate (FOSAMAX) 70 MG tablet Take 70 mg by mouth once a week. Take on Wednesdays with a full glass of water on an empty stomach.   Yes Historical Provider, MD  amLODipine (NORVASC) 10 MG tablet Take 1 tablet (10 mg total) by mouth daily. 11/15/12  Yes Oswald Hillock, MD  calcium carbonate (TUMS EX) 750 MG chewable tablet Chew 1 tablet by mouth daily as needed for heartburn.    Yes Historical Provider, MD  cyclobenzaprine (FLEXERIL) 10 MG tablet Take 10 mg by mouth 3 (three) times daily as needed for muscle spasms.   Yes Historical Provider, MD   enalapril (VASOTEC) 20 MG tablet Take 20 mg by mouth daily. 08/03/14  Yes Historical Provider, MD  HYDROcodone-acetaminophen (NORCO/VICODIN) 5-325 MG per tablet Take 1 tablet by mouth every 4 (four) hours as needed for moderate pain. 08/29/14  Yes Jola Schmidt, MD  mycophenolate (CELLCEPT) 500 MG tablet Take 1,500 mg by mouth 2 (two) times daily.    Yes Historical Provider, MD  predniSONE (DELTASONE) 10 MG tablet Take 10 mg by mouth daily with breakfast.    Yes Historical Provider, MD  simvastatin (ZOCOR) 40 MG tablet Take 40 mg by mouth daily.   Yes Historical Provider, MD  PRESCRIPTION MEDICATION Apply 5 mLs topically 4 (four) times daily as needed (Thrush). Magic Mouthwash    Historical Provider, MD  traMADol (ULTRAM) 50 MG tablet Take 1 tablet (50 mg total) by mouth every 6 (six) hours as needed. 06/29/14   Varney Biles, MD     Family History  Problem Relation Age of Onset  . Cancer Mother     breast and ovarian  . BRCA 1/2 Sister     History   Social History  . Marital Status: Single    Spouse Name: N/A  . Number of Children: N/A  . Years of Education: N/A  Social History Main Topics  . Smoking status: Never Smoker   . Smokeless tobacco: Never Used  . Alcohol Use: No     Comment: 07/20/2012 "might have a beer once/yr"  . Drug Use: No  . Sexual Activity: Not Currently    Birth Control/ Protection: None   Other Topics Concern  . None   Social History Narrative    Review of Systems: A 12 point ROS discussed and pertinent positives are indicated in the HPI above.  All other systems are negative.  Review of Systems  Constitutional: Negative for fever, activity change, appetite change, fatigue and unexpected weight change.  Respiratory: Negative for cough and shortness of breath.   Gastrointestinal: Negative for abdominal pain.  Genitourinary: Positive for hematuria and flank pain. Negative for difficulty urinating.  Musculoskeletal: Positive for back pain.  Neurological:  Positive for weakness.  Psychiatric/Behavioral: Negative for behavioral problems and confusion.     Vital Signs: BP 151/93 mmHg  Pulse 102  Temp(Src) 98.6 F (37 C)  Ht 5' 3"  (1.6 m)  Wt 104.327 kg (230 lb)  BMI 40.75 kg/m2  SpO2 99%  LMP 08/29/2014  Physical Exam  Constitutional: She appears well-nourished.  Cardiovascular: Normal rate, regular rhythm and normal heart sounds.   No murmur heard. Pulmonary/Chest: Effort normal and breath sounds normal. She has no wheezes.  Abdominal: Soft. Bowel sounds are normal. There is no tenderness.  Musculoskeletal: Normal range of motion.  Neurological: She is alert.  Skin: Skin is warm and dry.  Psychiatric: She has a normal mood and affect. Her behavior is normal. Judgment and thought content normal.  Nursing note and vitals reviewed.   Mallampati Score:  MD Evaluation Airway: WNL Heart: WNL Abdomen: WNL ASA  Classification: 3, 2 Mallampati/Airway Score: Two  Imaging: No results found.  Labs:  CBC:  Recent Labs  01/07/14 0803 06/29/14 0048 08/29/14 1127 09/03/14 0830  WBC 3.5* 7.9 3.5* 4.3  HGB 9.6* 11.7* 10.4* 10.6*  HCT 30.1* 36.8 32.6* 33.0*  PLT 260 334 328 309    COAGS:  Recent Labs  09/03/14 0830  INR 0.88  APTT 26    BMP:  Recent Labs  10/05/13 2353 01/07/14 0803 06/29/14 0048 08/29/14 1127  NA 141 139 139 140  K 3.1* 3.3* 3.4* 3.1*  CL 107 106 103 105  CO2  --  26 24 26   GLUCOSE 79 89 94 90  BUN 16 11 25* 15  CALCIUM  --  8.6 9.2 8.9  CREATININE 1.60* 1.50* 1.88* 1.66*  GFRNONAA  --  44* 33* 38*  GFRAA  --  50* 38* 44*    LIVER FUNCTION TESTS:  Recent Labs  01/07/14 0803  BILITOT <0.2*  AST 35  ALT 15  ALKPHOS 40  PROT 6.7  ALBUMIN 2.4*    TUMOR MARKERS: No results for input(s): AFPTM, CEA, CA199, CHROMGRNA in the last 8760 hours.  Assessment and Plan:  Lupus nephritis Worsening creatinine Followed with Dr Florene Glen Scheduled for random renal biopsy Risks and  Benefits discussed with the patient including, but not limited to bleeding, infection, damage to adjacent structures or low yield requiring additional tests. All of the patient's questions were answered, patient is agreeable to proceed. Consent signed and in chart.   Thank you for this interesting consult.  I greatly enjoyed meeting Lowen Mansouri and look forward to participating in their care.  Signed: Dariona Postma A 09/03/2014, 9:15 AM   I spent a total of  20 Minutes   in  face to face in clinical consultation, greater than 50% of which was counseling/coordinating care for random renal biopsy

## 2014-09-03 NOTE — H&P (Signed)
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  She was about to be discharged home and she seem to have an episode of possible seizure activity.  Her mother was in the room and states she was sleeping and her right arm started shaking.  She states she called her name and she didn't respond  Her mother states she has had episodes like this in the past which occur about every other month or so.   Patient has not mentioned it before because she states "I didn't think it was a big deal".  Pt denies any loss of bowel or bladder control.  Allergies: Food  Medications: Prior to Admission medications   Medication Sig Start Date End Date Taking? Authorizing Provider  acetaminophen (TYLENOL) 500 MG tablet Take 500 mg by mouth every 6 (six) hours as needed.   Yes Historical Provider, MD  alendronate (FOSAMAX) 70 MG tablet Take 70 mg by mouth once a week. Take on Wednesdays with a full glass of water on an empty stomach.   Yes Historical Provider, MD  amLODipine (NORVASC) 10 MG tablet Take 1 tablet (10 mg total) by mouth daily. 11/15/12  Yes Oswald Hillock, MD  calcium carbonate (TUMS EX) 750 MG chewable tablet Chew 1 tablet by mouth daily as needed for heartburn.    Yes Historical Provider, MD  cyclobenzaprine (FLEXERIL) 10 MG tablet Take 10 mg by mouth 3 (three) times daily as needed for muscle spasms.   Yes Historical Provider, MD  enalapril (VASOTEC) 20 MG tablet Take 20 mg by mouth daily. 08/03/14  Yes Historical Provider, MD  HYDROcodone-acetaminophen (NORCO/VICODIN) 5-325 MG per tablet Take 1 tablet by mouth every 4 (four) hours as needed for moderate pain. 08/29/14  Yes Jola Schmidt, MD  mycophenolate (CELLCEPT) 500 MG tablet Take 1,500 mg by mouth 2 (two) times daily.    Yes Historical Provider, MD  predniSONE (DELTASONE) 10 MG tablet Take 10 mg by mouth daily with breakfast.    Yes Historical Provider, MD  simvastatin  (ZOCOR) 40 MG tablet Take 40 mg by mouth daily.   Yes Historical Provider, MD  PRESCRIPTION MEDICATION Apply 5 mLs topically 4 (four) times daily as needed (Thrush). Magic Mouthwash    Historical Provider, MD  traMADol (ULTRAM) 50 MG tablet Take 1 tablet (50 mg total) by mouth every 6 (six) hours as needed. 06/29/14   Varney Biles, MD     Vital Signs: BP 143/81 mmHg  Pulse 95  Temp(Src) 98.6 F (37 C)  Resp 18  Ht 5\' 3"  (1.6 m)  Wt 230 lb (104.327 kg)  BMI 40.75 kg/m2  SpO2 98%  LMP 08/29/2014  Physical Exam  Constitutional: She is oriented to person, place, and time.  Morbidly obese  HENT:  Head: Normocephalic and atraumatic.  Eyes: EOM are normal.  Cardiovascular: Normal rate and regular rhythm.   Pulmonary/Chest: Effort normal and breath sounds normal.  Abdominal: Soft. Bowel sounds are normal.  Musculoskeletal: Normal range of motion.  Bilateral Grip Strength 3/5 (patient states she is very "weak and fatigued" all the time and this is not new)  Neurological: She is alert and oriented to person, place, and time. No cranial nerve deficit. Coordination normal.  Skin: Skin is warm and dry.  Psychiatric: She has a normal mood and affect. Her behavior is normal. Judgment and thought content normal.    Imaging: US Biopsy  09/03/2014   CLINICAL DATA:  39 year old with lupus nephritis and proteinuria.  EXAM: ULTRASOUND GUIDED LEFT KIDNEY BIOPSY  Physician: Stephan Minister. Anselm Pancoast, MD  FLUOROSCOPY TIME:  None  MEDICATIONS: 1.5 mg versed, 75 mcg fentanyl. A radiology nurse monitored the patient for moderate sedation.  ANESTHESIA/SEDATION: Moderate sedation time: 17 minutes  PROCEDURE: The procedure was explained to the patient. The risks and benefits of the procedure were discussed and the patient's questions were addressed. Informed consent was obtained from the patient. Patient was placed prone. Kidneys were evaluated with ultrasound. The left kidney was selected for biopsy. The left flank was  prepped with Betadine and a sterile field was created. Skin and soft tissues were anesthetized with 1% lidocaine. Using ultrasound guidance, 16 gauge core device was directed into the left kidney lower pole and a core biopsy was obtained. A total of 3 core biopsies were obtained and specimens were placed in saline. Bandage placed over the puncture site.  FINDINGS: Normal appearance of the left kidney without hydronephrosis. Samples obtained from the left kidney lower pole. No significant hematoma or bleeding following the core biopsies.  Estimated blood loss: Minimal  COMPLICATIONS: None  IMPRESSION: Ultrasound-guided core biopsies of the left kidney.   Electronically Signed   By: Markus Daft M.D.   On: 09/03/2014 13:26    Labs:  CBC:  Recent Labs  01/07/14 0803 06/29/14 0048 08/29/14 1127 09/03/14 0830  WBC 3.5* 7.9 3.5* 4.3  HGB 9.6* 11.7* 10.4* 10.6*  HCT 30.1* 36.8 32.6* 33.0*  PLT 260 334 328 309    COAGS:  Recent Labs  09/03/14 0830  INR 0.88  APTT 26    BMP:  Recent Labs  10/05/13 2353 01/07/14 0803 06/29/14 0048 08/29/14 1127  NA 141 139 139 140  K 3.1* 3.3* 3.4* 3.1*  CL 107 106 103 105  CO2  --  26 24 26   GLUCOSE 79 89 94 90  BUN 16 11 25* 15  CALCIUM  --  8.6 9.2 8.9  CREATININE 1.60* 1.50* 1.88* 1.66*  GFRNONAA  --  44* 33* 38*  GFRAA  --  50* 38* 44*    LIVER FUNCTION TESTS:  Recent Labs  01/07/14 0803  BILITOT <0.2*  AST 35  ALT 15  ALKPHOS 40  PROT 6.7  ALBUMIN 2.4*    Assessment and Plan:  S/P  Renal Bx for Lupus Nephritis. ? Small seizure, no incontinent event Have spoken to Dr. Ayesha Rumpf and agreeable to see patient in clinic tomorrow at 2pm for work up and possible referral.   Signed: Nevin Bloodgood 09/03/2014, 4:21 PM   I spent a total of 15 Minutes in face to face in clinical consultation/evaluation, greater than 50% of which was counseling/coordinating care for possible seizure activity.

## 2014-09-03 NOTE — Progress Notes (Signed)
Client's mother called me to room and client's right hand moving on right leg and client not responsive to verbal stimuli for 45sec; Dr Anselm Pancoast notified and PA in to see client; client states this happens at home and has never told a Dr about it

## 2014-09-03 NOTE — Discharge Instructions (Signed)
Kidney Biopsy, Care After °Refer to this sheet in the next few weeks. These instructions provide you with information on caring for yourself after your procedure. Your health care provider may also give you more specific instructions. Your treatment has been planned according to current medical practices, but problems sometimes occur. Call your health care provider if you have any problems or questions after your procedure.  °WHAT TO EXPECT AFTER THE PROCEDURE  °· You may notice blood in the urine for the first 24 hours after the biopsy. °· You may feel some pain at the biopsy site for 1-2 weeks after the biopsy. °HOME CARE INSTRUCTIONS °· Do not lift anything heavier than 10 lb (4.5 kg) for 2 weeks. °· Do not take any non-steroidal anti-inflammatory drugs (NSAIDs) or any blood thinners for a week after the biopsy unless instructed to do so by your health care provider. °· Only take medicines for pain, fever, or discomfort as directed by your health care provider. °SEEK MEDICAL CARE IF: °· You have bloody urine more than 24 hours after the biopsy.   °· You develop a fever.   °· You cannot urinate.   °· You have increasing pain at the biopsy site.   °SEEK IMMEDIATE MEDICAL CARE IF: °You feel faint or dizzy.  °Document Released: 11/16/2012 Document Reviewed: 11/16/2012 °[employer]® Patient Information ©2015 [employer]. This information is not intended to replace advice given to you by your health care provider. Make sure you discuss any questions you have with your health care provider. ° °

## 2014-09-11 ENCOUNTER — Encounter (HOSPITAL_COMMUNITY): Payer: Self-pay

## 2014-10-21 ENCOUNTER — Emergency Department (HOSPITAL_COMMUNITY): Payer: Medicaid Other

## 2014-10-21 ENCOUNTER — Emergency Department (HOSPITAL_COMMUNITY)
Admission: EM | Admit: 2014-10-21 | Discharge: 2014-10-22 | Disposition: A | Discharge status: Home / Self Care | Payer: Medicaid Other | Attending: Emergency Medicine | Admitting: Emergency Medicine

## 2014-10-21 ENCOUNTER — Encounter (HOSPITAL_COMMUNITY): Payer: Self-pay | Admitting: *Deleted

## 2014-10-21 DIAGNOSIS — R1011 Right upper quadrant pain: Secondary | ICD-10-CM

## 2014-10-21 DIAGNOSIS — R197 Diarrhea, unspecified: Secondary | ICD-10-CM | POA: Diagnosis not present

## 2014-10-21 DIAGNOSIS — I129 Hypertensive chronic kidney disease with stage 1 through stage 4 chronic kidney disease, or unspecified chronic kidney disease: Secondary | ICD-10-CM | POA: Insufficient documentation

## 2014-10-21 DIAGNOSIS — N72 Inflammatory disease of cervix uteri: Secondary | ICD-10-CM | POA: Insufficient documentation

## 2014-10-21 DIAGNOSIS — R011 Cardiac murmur, unspecified: Secondary | ICD-10-CM | POA: Diagnosis not present

## 2014-10-21 DIAGNOSIS — Z79899 Other long term (current) drug therapy: Secondary | ICD-10-CM | POA: Insufficient documentation

## 2014-10-21 DIAGNOSIS — Z862 Personal history of diseases of the blood and blood-forming organs and certain disorders involving the immune mechanism: Secondary | ICD-10-CM | POA: Diagnosis not present

## 2014-10-21 DIAGNOSIS — R112 Nausea with vomiting, unspecified: Secondary | ICD-10-CM | POA: Diagnosis not present

## 2014-10-21 DIAGNOSIS — N184 Chronic kidney disease, stage 4 (severe): Secondary | ICD-10-CM | POA: Insufficient documentation

## 2014-10-21 DIAGNOSIS — F419 Anxiety disorder, unspecified: Secondary | ICD-10-CM | POA: Insufficient documentation

## 2014-10-21 DIAGNOSIS — Z3202 Encounter for pregnancy test, result negative: Secondary | ICD-10-CM | POA: Insufficient documentation

## 2014-10-21 DIAGNOSIS — A5901 Trichomonal vulvovaginitis: Secondary | ICD-10-CM

## 2014-10-21 DIAGNOSIS — R630 Anorexia: Secondary | ICD-10-CM | POA: Insufficient documentation

## 2014-10-21 DIAGNOSIS — R7989 Other specified abnormal findings of blood chemistry: Secondary | ICD-10-CM

## 2014-10-21 DIAGNOSIS — E669 Obesity, unspecified: Secondary | ICD-10-CM | POA: Diagnosis not present

## 2014-10-21 DIAGNOSIS — Z9851 Tubal ligation status: Secondary | ICD-10-CM | POA: Insufficient documentation

## 2014-10-21 DIAGNOSIS — R000 Tachycardia, unspecified: Secondary | ICD-10-CM | POA: Diagnosis not present

## 2014-10-21 DIAGNOSIS — Z9089 Acquired absence of other organs: Secondary | ICD-10-CM | POA: Diagnosis not present

## 2014-10-21 DIAGNOSIS — R109 Unspecified abdominal pain: Secondary | ICD-10-CM | POA: Diagnosis present

## 2014-10-21 LAB — URINALYSIS, ROUTINE W REFLEX MICROSCOPIC
Bilirubin Urine: NEGATIVE
Glucose, UA: NEGATIVE mg/dL
Ketones, ur: NEGATIVE mg/dL
LEUKOCYTES UA: NEGATIVE
NITRITE: NEGATIVE
Protein, ur: >300 mg/dL — AB
Specific Gravity, Urine: 1.021 (ref 1.005–1.030)
UROBILINOGEN UA: 0.2 mg/dL (ref 0.0–1.0)
pH: 6 (ref 5.0–8.0)

## 2014-10-21 LAB — WET PREP, GENITAL
Clue Cells Wet Prep HPF POC: NONE SEEN
Yeast Wet Prep HPF POC: NONE SEEN

## 2014-10-21 LAB — COMPREHENSIVE METABOLIC PANEL
ALT: 13 U/L — AB (ref 14–54)
ANION GAP: 11 (ref 5–15)
AST: 42 U/L — ABNORMAL HIGH (ref 15–41)
Albumin: 2.8 g/dL — ABNORMAL LOW (ref 3.5–5.0)
Alkaline Phosphatase: 52 U/L (ref 38–126)
BUN: 16 mg/dL (ref 6–20)
CO2: 20 mmol/L — AB (ref 22–32)
Calcium: 8.9 mg/dL (ref 8.9–10.3)
Chloride: 107 mmol/L (ref 101–111)
Creatinine, Ser: 4.54 mg/dL — ABNORMAL HIGH (ref 0.44–1.00)
GFR calc Af Amer: 13 mL/min — ABNORMAL LOW (ref >60)
GFR, EST NON AFRICAN AMERICAN: 11 mL/min — AB (ref >60)
Glucose, Bld: 92 mg/dL (ref 65–99)
Potassium: 3 mmol/L — ABNORMAL LOW (ref 3.5–5.1)
SODIUM: 138 mmol/L (ref 135–145)
TOTAL PROTEIN: 7.1 g/dL (ref 6.5–8.1)
Total Bilirubin: 0.6 mg/dL (ref 0.3–1.2)

## 2014-10-21 LAB — CBC
HCT: 34.2 % — ABNORMAL LOW (ref 36.0–46.0)
Hemoglobin: 10.9 g/dL — ABNORMAL LOW (ref 12.0–15.0)
MCH: 29 pg (ref 26.0–34.0)
MCHC: 31.9 g/dL (ref 30.0–36.0)
MCV: 91 fL (ref 78.0–100.0)
Platelets: 248 10*3/uL (ref 150–400)
RBC: 3.76 MIL/uL — AB (ref 3.87–5.11)
RDW: 13.5 % (ref 11.5–15.5)
WBC: 3.3 10*3/uL — ABNORMAL LOW (ref 4.0–10.5)

## 2014-10-21 LAB — URINE MICROSCOPIC-ADD ON

## 2014-10-21 LAB — LIPASE, BLOOD: LIPASE: 21 U/L — AB (ref 22–51)

## 2014-10-21 LAB — POC URINE PREG, ED: PREG TEST UR: NEGATIVE

## 2014-10-21 MED ORDER — AZITHROMYCIN 250 MG PO TABS
1000.0000 mg | ORAL_TABLET | Freq: Once | ORAL | Status: AC
  Administered 2014-10-21: 1000 mg via ORAL
  Filled 2014-10-21: qty 4

## 2014-10-21 MED ORDER — CEFTRIAXONE SODIUM 250 MG IJ SOLR
250.0000 mg | Freq: Once | INTRAMUSCULAR | Status: AC
  Administered 2014-10-22: 250 mg via INTRAMUSCULAR
  Filled 2014-10-21: qty 250

## 2014-10-21 MED ORDER — ACETAMINOPHEN 500 MG PO TABS
1000.0000 mg | ORAL_TABLET | Freq: Once | ORAL | Status: AC
  Administered 2014-10-21: 1000 mg via ORAL
  Filled 2014-10-21: qty 2

## 2014-10-21 MED ORDER — POTASSIUM CHLORIDE CRYS ER 20 MEQ PO TBCR
40.0000 meq | EXTENDED_RELEASE_TABLET | Freq: Once | ORAL | Status: AC
  Administered 2014-10-22: 40 meq via ORAL
  Filled 2014-10-21: qty 2

## 2014-10-21 MED ORDER — LIDOCAINE HCL (PF) 1 % IJ SOLN
INTRAMUSCULAR | Status: AC
  Administered 2014-10-22: 5 mL
  Filled 2014-10-21: qty 5

## 2014-10-21 MED ORDER — ONDANSETRON 4 MG PO TBDP
8.0000 mg | ORAL_TABLET | Freq: Once | ORAL | Status: AC
  Administered 2014-10-21: 8 mg via ORAL
  Filled 2014-10-21: qty 2

## 2014-10-21 MED ORDER — SODIUM CHLORIDE 0.9 % IV BOLUS (SEPSIS)
1000.0000 mL | Freq: Once | INTRAVENOUS | Status: AC
Start: 2014-10-21 — End: 2014-10-22
  Administered 2014-10-21: 1000 mL via INTRAVENOUS

## 2014-10-21 NOTE — ED Provider Notes (Signed)
CSN: [id number]     Arrival date & time 10/21/14  1915 History   First MD Initiated Contact with Patient 10/21/14 2023     Chief Complaint  Patient presents with  . Emesis  . 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 Temperatures have been ranging from 99.2-102. States she feels very thirsty, has dry mouth, and anytime she drinks or eats something she vomits it back up. No hematemesis. Over the past month, she has been experiencing intermittent right-sided abdominal pain, initially only present when sneezing or bending in a certain way, over the past week increasing in intensity and time, now coming and going at random lasting about 5 minutes, described as sharp and stabbing in the right lower quadrant. History of appendectomy. Had one episode of non-bloody diarrhea today. Currently on her menstrual cycle. Denies any urinary symptoms or vaginal discharge.  Patient is a 39 y.o. female presenting with vomiting and abdominal pain. The history is provided by the patient.  Emesis Associated symptoms: abdominal pain and diarrhea   Abdominal Pain Associated symptoms: diarrhea, fever, nausea and vomiting     Past Medical History  Diagnosis Date  . Hypertension   . Cardiac arrhythmia   . SVT (supraventricular tachycardia)     "today, last week, 2 wk ago; maybe 1 month ago, etc; started w/in last 3-4 yrs"(07/20/2012)  . Fainting     "~ 1 month ago; probably related to SVT" (07/20/2012)  . Heart murmur     "small" (07/20/2012)  . Shortness of breath     "related to SVT episodes" (07/20/2012)  . Anemia     "today" (07/20/2012)  . Chest pain at rest     "related to SVT" (07/20/2012)  . Sleep apnea     "don't wear mask" (07/20/2012)  . Daily  headache   . Anxiety   . Lupus (systemic lupus erythematosus)   . Renal disorder   . Chronic renal failure, stage 4 (severe)    Past Surgical History  Procedure Laterality Date  . Appendectomy  2011  . Tubal ligation  2010  . Cesarean section  2001; 2010  . Cervical biopsy  2013  . Ablation      07/21/12 Dr. Cristopher Peru    Family History  Problem Relation Age of Onset  . Cancer Mother     breast and ovarian  . BRCA 1/2 Sister    History  Substance Use Topics  . Smoking status: Never Smoker   . Smokeless tobacco: Never Used  . Alcohol Use: No     Comment: 07/20/2012 "might have a beer once/yr"   OB History    Gravida Para Term Preterm AB TAB SAB Ectopic Multiple Living   2 2 2  0 0 0 0 0 0 2     Review of Systems  Constitutional: Positive for fever and appetite change.  Gastrointestinal: Positive for nausea, vomiting, abdominal pain and diarrhea.  All other systems reviewed and are negative.     Allergies  Food  Home Medications   Prior to Admission medications   Medication Sig Start Date End Date Taking? Authorizing Provider  acetaminophen (TYLENOL) 500 MG tablet Take 1,000 mg by mouth every 6 (six) hours as needed (pain).    Yes Historical Provider, MD  alendronate (FOSAMAX) 70 MG tablet Take 70 mg  by mouth once a week. Take on Wednesdays with a full glass of water on an empty stomach.   Yes Historical Provider, MD  amLODipine (NORVASC) 10 MG tablet Take 1 tablet (10 mg total) by mouth daily. 11/15/12  Yes Oswald Hillock, MD  Calcium Carbonate Antacid (TUMS PO) Take 1-2 tablets by mouth daily as needed (heartburn).   Yes Historical Provider, MD  cyclobenzaprine (FLEXERIL) 10 MG tablet Take 10 mg by mouth at bedtime as needed for muscle spasms.    Yes Historical Provider, MD  enalapril (VASOTEC) 20 MG tablet Take 20 mg by mouth daily. 08/03/14  Yes Historical Provider, MD  mycophenolate (CELLCEPT) 500 MG tablet Take 1,500 mg by mouth 2 (two) times daily.    Yes  Historical Provider, MD  predniSONE (DELTASONE) 10 MG tablet Take 10 mg by mouth daily with breakfast.    Yes Historical Provider, MD  simvastatin (ZOCOR) 40 MG tablet Take 40 mg by mouth daily.   Yes Historical Provider, MD  doxycycline (VIBRAMYCIN) 100 MG capsule Take 1 capsule (100 mg total) by mouth 2 (two) times daily. One po bid x 14 days 10/22/14   Carman Ching, PA-C  HYDROcodone-acetaminophen (NORCO/VICODIN) 5-325 MG per tablet Take 1 tablet by mouth every 4 (four) hours as needed for moderate pain. Patient not taking: Reported on 10/21/2014 08/29/14   Jola Schmidt, MD  metroNIDAZOLE (FLAGYL) 500 MG tablet Take 1 tablet (500 mg total) by mouth 2 (two) times daily. One po bid x 7 days 10/22/14   Carman Ching, PA-C  ondansetron (ZOFRAN ODT) 4 MG disintegrating tablet 19m ODT q4 hours prn nausea/vomit 10/22/14   Marcos Ruelas M Avid Guillette, PA-C  traMADol (ULTRAM) 50 MG tablet Take 1 tablet (50 mg total) by mouth every 6 (six) hours as needed. Patient not taking: Reported on 10/21/2014 06/29/14   AVarney Biles MD   BP 163/100 mmHg  Pulse 105  Temp(Src) 99 F (37.2 C) (Oral)  Resp 16  Ht 5' 3"  (1.6 m)  Wt 226 lb 5 oz (102.655 kg)  BMI 40.10 kg/m2  SpO2 100%  LMP 10/21/2014 Physical Exam  Constitutional: She is oriented to person, place, and time. She appears well-developed and well-nourished. No distress.  Obese.  HENT:  Head: Normocephalic and atraumatic.  Dry MM.  Eyes: Conjunctivae and EOM are normal.  Neck: Normal range of motion. Neck supple.  Cardiovascular: Regular rhythm and normal heart sounds.  Tachycardia present.   Pulmonary/Chest: Effort normal and breath sounds normal. No respiratory distress.  Abdominal: Normal appearance and bowel sounds are normal. She exhibits no distension. There is tenderness in the right upper quadrant. There is no rigidity, no rebound, no guarding, no CVA tenderness and negative Murphy's sign.  No peritoneal signs.  Genitourinary: Cervix exhibits motion  tenderness and discharge. Right adnexum displays no mass, no tenderness and no fullness. Left adnexum displays no mass, no tenderness and no fullness. There is bleeding (menstruating) in the vagina. No erythema or tenderness in the vagina. Vaginal discharge found.  Musculoskeletal: Normal range of motion. She exhibits no edema.  Neurological: She is alert and oriented to person, place, and time. No sensory deficit.  Skin: Skin is warm and dry.  Psychiatric: She has a normal mood and affect. Her behavior is normal.  Nursing note and vitals reviewed.   ED Course  Procedures (including critical care time) Labs Review Labs Reviewed  WET PREP, GENITAL - Abnormal; Notable for the following:    Trich, Wet Prep MANY (*)  WBC, Wet Prep HPF POC MANY (*)    All other components within normal limits  LIPASE, BLOOD - Abnormal; Notable for the following:    Lipase 21 (*)    All other components within normal limits  COMPREHENSIVE METABOLIC PANEL - Abnormal; Notable for the following:    Potassium 3.0 (*)    CO2 20 (*)    Creatinine, Ser 4.54 (*)    Albumin 2.8 (*)    AST 42 (*)    ALT 13 (*)    GFR calc non Af Amer 11 (*)    GFR calc Af Amer 13 (*)    All other components within normal limits  CBC - Abnormal; Notable for the following:    WBC 3.3 (*)    RBC 3.76 (*)    Hemoglobin 10.9 (*)    HCT 34.2 (*)    All other components within normal limits  URINALYSIS, ROUTINE W REFLEX MICROSCOPIC (NOT AT [facility]) - Abnormal; Notable for the following:    APPearance CLOUDY (*)    Hgb urine dipstick LARGE (*)    Protein, ur >300 (*)    All other components within normal limits  URINE MICROSCOPIC-ADD ON - Abnormal; Notable for the following:    Squamous Epithelial / LPF FEW (*)    Casts HYALINE CASTS (*)    All other components within normal limits  POC URINE PREG, ED  GC/CHLAMYDIA PROBE AMP ([place]) NOT AT [facility]    Imaging Review US Abdomen Complete  10/21/2014   CLINICAL DATA:   Right-sided abdominal pain and emesis for 1 week.  EXAM: ULTRASOUND ABDOMEN COMPLETE  COMPARISON:  CT 09/08/2009  FINDINGS: Gallbladder: Physiologically distended. No gallstones or wall thickening visualized. No sonographic Murphy sign noted.  Common bile duct: Diameter: 2-3 mm.  Liver: No focal lesion identified. Within normal limits in parenchymal echogenicity. Normal directional flow in the main portal vein.  IVC: No abnormality visualized.  Pancreas: Not visualized due to bowel gas.  Spleen: Size and appearance within normal limits.  Right Kidney: Length: 10.5 cm. Echogenicity within normal limits. No mass or hydronephrosis visualized.  Left Kidney: Length: 11.9 cm. Echogenicity within normal limits. No mass or hydronephrosis visualized.  Abdominal aorta: No aneurysm visualized.  Other findings: No ascites.  IMPRESSION: Normal abdominal ultrasound.  The pancreas is obscured.   Electronically Signed   By: Jeb Levering M.D.   On: 10/21/2014 22:32     EKG Interpretation None      MDM   Final diagnoses:  Trichomonas vaginitis  Cervicitis  RUQ abdominal pain  Elevated serum creatinine   Nontoxic appearing, NAD. Temperature 99.9 on arrival and tachycardic. Abdomen is soft with no peritoneal signs. Symptoms have been gradually building. Given right upper quadrant tenderness and no right lower quadrant tenderness, ultrasound obtained to evaluate gallbladder. Ultrasound normal. Labs significant for leukopenia of 3.3 which on chart review seems to be pretty consistent for the patient. Hypokalemia of 3.0. Will replace by mouth. Creatinine elevated at 4.54, known stage IV kidney disease. Both kidneys visualized on ultrasound, no mass or hydronephrosis visualized. Pain medication offered for the patient and she would prefer to not have any pain medicine at this time as her pain is not severe. Given Zofran for nausea with complete relief. On UA, Trichomonas present. Discussed this finding with patient and  discussed pelvic exam and patient agreeable. Pelvic exam significant for cervical motion tenderness. No adnexal tenderness. Doubt TOA. Most likely PID. Will give Rocephin and azithromycin in  the ED and discharged home with Flagyl and doxycycline. After receiving IV fluids and tylenol, HR improved. Stable for d/c. F/u with PCP within 1-2 days. Return precautions given. Patient states understanding of treatment care plan and is agreeable.  Carman Ching, PA-C 10/22/14 [place], MD 10/23/14 [phone]

## 2014-10-21 NOTE — ED Notes (Signed)
The pot is c/o abd pain with nausea vomiting for one week.  lmp now   Some diarrhea

## 2014-10-21 NOTE — ED Notes (Signed)
Lucien Mons, PA at bedside at this time.

## 2014-10-22 LAB — GC/CHLAMYDIA PROBE AMP (~~LOC~~) NOT AT ARMC
CHLAMYDIA, DNA PROBE: NEGATIVE
NEISSERIA GONORRHEA: NEGATIVE

## 2014-10-22 MED ORDER — METRONIDAZOLE 500 MG PO TABS
500.0000 mg | ORAL_TABLET | Freq: Two times a day (BID) | ORAL | Status: DC
Start: 2014-10-22 — End: 2014-11-21

## 2014-10-22 MED ORDER — ONDANSETRON 4 MG PO TBDP: ORAL_TABLET | ORAL | Status: DC

## 2014-10-22 MED ORDER — DOXYCYCLINE HYCLATE 100 MG PO CAPS: 100.0000 mg | ORAL_CAPSULE | Freq: Two times a day (BID) | ORAL | Status: DC

## 2014-10-22 NOTE — Discharge Instructions (Signed)
Cervicitis Cervicitis is a soreness and swelling (inflammation) of the cervix. Your cervix is located at the bottom of your uterus. It opens up to the vagina. CAUSES   Sexually transmitted infections (STIs).   Allergic reaction.   Medicines or birth control devices that are put in the vagina.   Injury to the cervix.   Bacterial infections.  RISK FACTORS You are at greater risk if you:  Have unprotected sexual intercourse.  Have sexual intercourse with many partners.  Began sexual intercourse at an early age.  Have a history of STIs. SYMPTOMS  There may be no symptoms. If symptoms occur, they may include:   Gray, white, yellow, or bad-smelling vaginal discharge.   Pain or itching of the area outside the vagina.   Painful sexual intercourse.   Lower abdominal or lower back pain, especially during intercourse.   Frequent urination.   Abnormal vaginal bleeding between periods, after sexual intercourse, or after menopause.   Pressure or a heavy feeling in the pelvis.  DIAGNOSIS  Diagnosis is made after a pelvic exam. Other tests may include:   Examination of any discharge under a microscope (wet prep).   A Pap test.  TREATMENT  Treatment will depend on the cause of cervicitis. If it is caused by an STI, both you and your partner will need to be treated. Antibiotic medicines will be given.  HOME CARE INSTRUCTIONS   Do not have sexual intercourse until your health care provider says it is okay.   Do not have sexual intercourse until your partner has been treated, if your cervicitis is caused by an STI.   Take your antibiotics as directed. Finish them even if you start to feel better.  SEEK MEDICAL CARE IF:  Your symptoms come back.   You have a fever.  MAKE SURE YOU:   Understand these instructions.  Will watch your condition.  Will get help right away if you are not doing well or get worse. Document Released: 03/16/2005 Document Revised:  03/21/2013 Document Reviewed: 09/07/2012 [facility] Patient Information 2015 [place], [place]. This information is not intended to replace advice given to you by your health care provider. Make sure you discuss any questions you have with your health care provider.  Trichomoniasis Trichomoniasis is an infection caused by an organism called Trichomonas. The infection can affect both women and men. In women, the outer female genitalia and the vagina are affected. In men, the penis is mainly affected, but the prostate and other reproductive organs can also be involved. Trichomoniasis is a sexually transmitted infection (STI) and is most often passed to another person through sexual contact.  RISK FACTORS  Having unprotected sexual intercourse.  Having sexual intercourse with an infected partner. SIGNS AND SYMPTOMS  Symptoms of trichomoniasis in women include:  Abnormal gray-green frothy vaginal discharge.  Itching and irritation of the vagina.  Itching and irritation of the area outside the vagina. Symptoms of trichomoniasis in men include:   Penile discharge with or without pain.  Pain during urination. This results from inflammation of the urethra. DIAGNOSIS  Trichomoniasis may be found during a Pap test or physical exam. Your health care provider may use one of the following methods to help diagnose this infection:  Examining vaginal discharge under a microscope. For men, urethral discharge would be examined.  Testing the pH of the vagina with a test tape.  Using a vaginal swab test that checks for the Trichomonas organism. A test is available that provides results within a few  minutes.  Doing a culture test for the organism. This is not usually needed. TREATMENT   You may be given medicine to fight the infection. Women should inform their health care provider if they could be or are pregnant. Some medicines used to treat the infection should not be taken during pregnancy.  Your  health care provider may recommend over-the-counter medicines or creams to decrease itching or irritation.  Your sexual partner will need to be treated if infected. HOME CARE INSTRUCTIONS   Take medicines only as directed by your health care provider.  Take over-the-counter medicine for itching or irritation as directed by your health care provider.  Do not have sexual intercourse while you have the infection.  Women should not douche or wear tampons while they have the infection.  Discuss your infection with your partner. Your partner may have gotten the infection from you, or you may have gotten it from your partner.  Have your sex partner get examined and treated if necessary.  Practice safe, informed, and protected sex.  See your health care provider for other STI testing. SEEK MEDICAL CARE IF:   You still have symptoms after you finish your medicine.  You develop abdominal pain.  You have pain when you urinate.  You have bleeding after sexual intercourse.  You develop a rash.  Your medicine makes you sick or makes you throw up (vomit). MAKE SURE YOU:  Understand these instructions.  Will watch your condition.  Will get help right away if you are not doing well or get worse. Document Released: 09/09/2000 Document Revised: 07/31/2013 Document Reviewed: 12/26/2012 [facility] Patient Information 2015 [place], [place]. This information is not intended to replace advice given to you by your health care provider. Make sure you discuss any questions you have with your health care provider.  Abdominal Pain, Women Abdominal (stomach, pelvic, or belly) pain can be caused by many things. It is important to tell your doctor:  The location of the pain.  Does it come and go or is it present all the time?  Are there things that start the pain (eating certain foods, exercise)?  Are there other symptoms associated with the pain (fever, nausea, vomiting, diarrhea)? All of this is  helpful to know when trying to find the cause of the pain. CAUSES   Stomach: virus or bacteria infection, or ulcer.  Intestine: appendicitis (inflamed appendix), regional ileitis (Crohn's disease), ulcerative colitis (inflamed colon), irritable bowel syndrome, diverticulitis (inflamed diverticulum of the colon), or cancer of the stomach or intestine.  Gallbladder disease or stones in the gallbladder.  Kidney disease, kidney stones, or infection.  Pancreas infection or cancer.  Fibromyalgia (pain disorder).  Diseases of the female organs:  Uterus: fibroid (non-cancerous) tumors or infection.  Fallopian tubes: infection or tubal pregnancy.  Ovary: cysts or tumors.  Pelvic adhesions (scar tissue).  Endometriosis (uterus lining tissue growing in the pelvis and on the pelvic organs).  Pelvic congestion syndrome (female organs filling up with blood just before the menstrual period).  Pain with the menstrual period.  Pain with ovulation (producing an egg).  Pain with an IUD (intrauterine device, birth control) in the uterus.  Cancer of the female organs.  Functional pain (pain not caused by a disease, may improve without treatment).  Psychological pain.  Depression. DIAGNOSIS  Your doctor will decide the seriousness of your pain by doing an examination.  Blood tests.  X-rays.  Ultrasound.  CT scan (computed tomography, special type of X-ray).  MRI (magnetic resonance  imaging).  Cultures, for infection.  Barium enema (dye inserted in the large intestine, to better view it with X-rays).  Colonoscopy (looking in intestine with a lighted tube).  Laparoscopy (minor surgery, looking in abdomen with a lighted tube).  Major abdominal exploratory surgery (looking in abdomen with a large incision). TREATMENT  The treatment will depend on the cause of the pain.   Many cases can be observed and treated at home.  Over-the-counter medicines recommended by your  caregiver.  Prescription medicine.  Antibiotics, for infection.  Birth control pills, for painful periods or for ovulation pain.  Hormone treatment, for endometriosis.  Nerve blocking injections.  Physical therapy.  Antidepressants.  Counseling with a psychologist or psychiatrist.  Minor or major surgery. HOME CARE INSTRUCTIONS   Do not take laxatives, unless directed by your caregiver.  Take over-the-counter pain medicine only if ordered by your caregiver. Do not take aspirin because it can cause an upset stomach or bleeding.  Try a clear liquid diet (broth or water) as ordered by your caregiver. Slowly move to a bland diet, as tolerated, if the pain is related to the stomach or intestine.  Have a thermometer and take your temperature several times a day, and record it.  Bed rest and sleep, if it helps the pain.  Avoid sexual intercourse, if it causes pain.  Avoid stressful situations.  Keep your follow-up appointments and tests, as your caregiver orders.  If the pain does not go away with medicine or surgery, you may try:  Acupuncture.  Relaxation exercises (yoga, meditation).  Group therapy.  Counseling. SEEK MEDICAL CARE IF:   You notice certain foods cause stomach pain.  Your home care treatment is not helping your pain.  You need stronger pain medicine.  You want your IUD removed.  You feel faint or lightheaded.  You develop nausea and vomiting.  You develop a rash.  You are having side effects or an allergy to your medicine. SEEK IMMEDIATE MEDICAL CARE IF:   Your pain does not go away or gets worse.  You have a fever.  Your pain is felt only in portions of the abdomen. The right side could possibly be appendicitis. The left lower portion of the abdomen could be colitis or diverticulitis.  You are passing blood in your stools (bright red or black tarry stools, with or without vomiting).  You have blood in your urine.  You develop  chills, with or without a fever.  You pass out. MAKE SURE YOU:   Understand these instructions.  Will watch your condition.  Will get help right away if you are not doing well or get worse. Document Released: 01/11/2007 Document Revised: 07/31/2013 Document Reviewed: 01/31/2009 [facility] Patient Information 2015 [place], [place]. This information is not intended to replace advice given to you by your health care provider. Make sure you discuss any questions you have with your health care provider.

## 2014-11-13 ENCOUNTER — Encounter (HOSPITAL_COMMUNITY): Payer: Self-pay | Admitting: Emergency Medicine

## 2014-11-13 ENCOUNTER — Emergency Department (HOSPITAL_COMMUNITY): Payer: Medicaid Other

## 2014-11-13 ENCOUNTER — Emergency Department (HOSPITAL_COMMUNITY)
Admission: EM | Admit: 2014-11-13 | Discharge: 2014-11-13 | Disposition: A | Discharge status: Home / Self Care | Payer: Medicaid Other | Attending: Emergency Medicine | Admitting: Emergency Medicine

## 2014-11-13 DIAGNOSIS — R011 Cardiac murmur, unspecified: Secondary | ICD-10-CM | POA: Insufficient documentation

## 2014-11-13 DIAGNOSIS — R0789 Other chest pain: Secondary | ICD-10-CM | POA: Diagnosis not present

## 2014-11-13 DIAGNOSIS — Z7952 Long term (current) use of systemic steroids: Secondary | ICD-10-CM | POA: Diagnosis not present

## 2014-11-13 DIAGNOSIS — Z862 Personal history of diseases of the blood and blood-forming organs and certain disorders involving the immune mechanism: Secondary | ICD-10-CM | POA: Insufficient documentation

## 2014-11-13 DIAGNOSIS — E669 Obesity, unspecified: Secondary | ICD-10-CM | POA: Insufficient documentation

## 2014-11-13 DIAGNOSIS — R079 Chest pain, unspecified: Secondary | ICD-10-CM | POA: Diagnosis present

## 2014-11-13 DIAGNOSIS — N184 Chronic kidney disease, stage 4 (severe): Secondary | ICD-10-CM | POA: Diagnosis not present

## 2014-11-13 DIAGNOSIS — Z3202 Encounter for pregnancy test, result negative: Secondary | ICD-10-CM | POA: Insufficient documentation

## 2014-11-13 DIAGNOSIS — R000 Tachycardia, unspecified: Secondary | ICD-10-CM | POA: Diagnosis not present

## 2014-11-13 DIAGNOSIS — I129 Hypertensive chronic kidney disease with stage 1 through stage 4 chronic kidney disease, or unspecified chronic kidney disease: Secondary | ICD-10-CM | POA: Diagnosis not present

## 2014-11-13 DIAGNOSIS — Z872 Personal history of diseases of the skin and subcutaneous tissue: Secondary | ICD-10-CM | POA: Diagnosis not present

## 2014-11-13 DIAGNOSIS — Z79899 Other long term (current) drug therapy: Secondary | ICD-10-CM | POA: Insufficient documentation

## 2014-11-13 DIAGNOSIS — F419 Anxiety disorder, unspecified: Secondary | ICD-10-CM | POA: Insufficient documentation

## 2014-11-13 DIAGNOSIS — N189 Chronic kidney disease, unspecified: Secondary | ICD-10-CM

## 2014-11-13 LAB — CBC WITH DIFFERENTIAL/PLATELET
BASOS PCT: 0 % (ref 0–1)
Basophils Absolute: 0 10*3/uL (ref 0.0–0.1)
EOS ABS: 0 10*3/uL (ref 0.0–0.7)
Eosinophils Relative: 1 % (ref 0–5)
HCT: 27.8 % — ABNORMAL LOW (ref 36.0–46.0)
Hemoglobin: 9.1 g/dL — ABNORMAL LOW (ref 12.0–15.0)
Lymphocytes Relative: 13 % (ref 12–46)
Lymphs Abs: 0.6 10*3/uL — ABNORMAL LOW (ref 0.7–4.0)
MCH: 29 pg (ref 26.0–34.0)
MCHC: 32.7 g/dL (ref 30.0–36.0)
MCV: 88.5 fL (ref 78.0–100.0)
MONO ABS: 0.4 10*3/uL (ref 0.1–1.0)
MONOS PCT: 9 % (ref 3–12)
Neutro Abs: 3.2 10*3/uL (ref 1.7–7.7)
Neutrophils Relative %: 77 % (ref 43–77)
Platelets: 269 10*3/uL (ref 150–400)
RBC: 3.14 MIL/uL — ABNORMAL LOW (ref 3.87–5.11)
RDW: 13.8 % (ref 11.5–15.5)
WBC: 4.2 10*3/uL (ref 4.0–10.5)

## 2014-11-13 LAB — I-STAT CHEM 8, ED
BUN: 65 mg/dL — ABNORMAL HIGH (ref 6–20)
CALCIUM ION: 1.12 mmol/L (ref 1.12–1.23)
CHLORIDE: 112 mmol/L — AB (ref 101–111)
Creatinine, Ser: 5.8 mg/dL — ABNORMAL HIGH (ref 0.44–1.00)
Glucose, Bld: 85 mg/dL (ref 65–99)
HCT: 29 % — ABNORMAL LOW (ref 36.0–46.0)
Hemoglobin: 9.9 g/dL — ABNORMAL LOW (ref 12.0–15.0)
Potassium: 4.4 mmol/L (ref 3.5–5.1)
SODIUM: 138 mmol/L (ref 135–145)
TCO2: 19 mmol/L (ref 0–100)

## 2014-11-13 LAB — I-STAT TROPONIN, ED
TROPONIN I, POC: 0.03 ng/mL (ref 0.00–0.08)
TROPONIN I, POC: 0.03 ng/mL (ref 0.00–0.08)

## 2014-11-13 LAB — D-DIMER, QUANTITATIVE (NOT AT ARMC): D DIMER QUANT: 2.42 ug{FEU}/mL — AB (ref 0.00–0.48)

## 2014-11-13 LAB — POC URINE PREG, ED: Preg Test, Ur: NEGATIVE

## 2014-11-13 MED ORDER — FENTANYL CITRATE (PF) 100 MCG/2ML IJ SOLN
50.0000 ug | Freq: Once | INTRAMUSCULAR | Status: AC
  Administered 2014-11-13: 50 ug via INTRAVENOUS
  Filled 2014-11-13: qty 2

## 2014-11-13 MED ORDER — ONDANSETRON HCL 4 MG/2ML IJ SOLN
4.0000 mg | Freq: Once | INTRAMUSCULAR | Status: AC
  Administered 2014-11-13: 4 mg via INTRAVENOUS
  Filled 2014-11-13: qty 2

## 2014-11-13 MED ORDER — SODIUM CHLORIDE 0.9 % IV BOLUS (SEPSIS)
1000.0000 mL | Freq: Once | INTRAVENOUS | Status: AC
  Administered 2014-11-13: 1000 mL via INTRAVENOUS

## 2014-11-13 MED ORDER — TECHNETIUM TO 99M ALBUMIN AGGREGATED
6.0000 | Freq: Once | INTRAVENOUS | Status: AC | PRN
  Administered 2014-11-13: 6 via INTRAVENOUS

## 2014-11-13 MED ORDER — FAMOTIDINE 40 MG PO TABS: 40.0000 mg | ORAL_TABLET | Freq: Every day | ORAL | Status: DC

## 2014-11-13 MED ORDER — METOPROLOL SUCCINATE ER 25 MG PO TB24: 25.0000 mg | ORAL_TABLET | Freq: Every day | ORAL | Status: DC

## 2014-11-13 MED ORDER — SODIUM CHLORIDE 0.9 % IV BOLUS (SEPSIS): 1000.0000 mL | Freq: Once | INTRAVENOUS | Status: DC

## 2014-11-13 MED ORDER — AMLODIPINE BESYLATE 5 MG PO TABS: 10.0000 mg | ORAL_TABLET | Freq: Every day | ORAL | Status: DC

## 2014-11-13 MED ORDER — MORPHINE SULFATE (PF) 4 MG/ML IV SOLN
4.0000 mg | Freq: Once | INTRAVENOUS | Status: AC
  Administered 2014-11-13: 4 mg via INTRAVENOUS
  Filled 2014-11-13: qty 1

## 2014-11-13 MED ORDER — TECHNETIUM TC 99M DIETHYLENETRIAME-PENTAACETIC ACID: 40.0000 | Freq: Once | INTRAVENOUS | Status: DC | PRN

## 2014-11-13 NOTE — ED Notes (Signed)
Pt acknowledge taking all belongings home.

## 2014-11-13 NOTE — ED Notes (Signed)
PT placed in gown and in bed. Pt monitored by pulse ox, bp cuff, and 12-lead. 

## 2014-11-13 NOTE — ED Notes (Signed)
Pt woke up for work feeling very tired/shaking/ feeling like heart beating fast. HR 116 at home. Pt has hx of SVT. Pt complains of chest pain feeling like it's shooting to her back.

## 2014-11-13 NOTE — ED Notes (Signed)
Patient transported to VQ 

## 2014-11-13 NOTE — ED Provider Notes (Signed)
CSN: [id number]     Arrival date & time 11/13/14  5038 History   First MD Initiated Contact with Patient 11/13/14 [phone]     Chief Complaint  Patient presents with  . Chest Pain     (Consider location/radiation/quality/duration/timing/severity/associated sxs/prior Treatment) HPI   39 year old female with history of cardiac arrhythmias including SVT, heart murmur, chest pain related to SVT, lupus and chronic renal disease stage IV who presents for evaluation of chest pain. Patient report she woke up this morning feeling very weak, tired, and having heart palpitation. She initially check her blood pressure and heart rate and it was in the 110s She then took her morning medication including Norvasc, and metoprolol and waited for approximately 1 hour. When she rechecked it again her heart rate still remains elevated. She then developed stabbing chest pain to her mid chest radiates to her back. This has been ongoing for the past 1 hour. The pain is associated with dizziness and mild shortness of breath. Despite resting and taking her medication has not improved. She rates the pain is mild at this time. She has had similar episodes like this in the past but usually lasting only for 10-15 minutes. She denies having fever, chills, headache, productive cough, abdominal pain, nausea vomiting diarrhea, dysuria, focal numbness. She has had prior cardiac ablation to decrease the SVT episodes. She denies any prior history of PE or DVT.  Her last menstrual period was July 24  Past Medical History  Diagnosis Date  . Hypertension   . Cardiac arrhythmia   . SVT (supraventricular tachycardia)     "today, last week, 2 wk ago; maybe 1 month ago, etc; started w/in last 3-4 yrs"(07/20/2012)  . Fainting     "~ 1 month ago; probably related to SVT" (07/20/2012)  . Heart murmur     "small" (07/20/2012)  . Shortness of breath     "related to SVT episodes" (07/20/2012)  . Anemia     "today" (07/20/2012)  . Chest pain at  rest     "related to SVT" (07/20/2012)  . Sleep apnea     "don't wear mask" (07/20/2012)  . Daily headache   . Anxiety   . Lupus (systemic lupus erythematosus)   . Renal disorder   . Chronic renal failure, stage 4 (severe)    Past Surgical History  Procedure Laterality Date  . Appendectomy  2011  . Tubal ligation  2010  . Cesarean section  2001; 2010  . Cervical biopsy  2013  . Ablation      07/21/12 Dr. Cristopher Peru    Family History  Problem Relation Age of Onset  . Cancer Mother     breast and ovarian  . BRCA 1/2 Sister    Social History  Substance Use Topics  . Smoking status: Never Smoker   . Smokeless tobacco: Never Used  . Alcohol Use: No     Comment: 07/20/2012 "might have a beer once/yr"   OB History    Gravida Para Term Preterm AB TAB SAB Ectopic Multiple Living   2 2 2  0 0 0 0 0 0 2     Review of Systems  All other systems reviewed and are negative.     Allergies  Food  Home Medications   Prior to Admission medications   Medication Sig Start Date End Date Taking? Authorizing Provider  acetaminophen (TYLENOL) 500 MG tablet Take 1,000 mg by mouth every 6 (six) hours as needed (pain).     Historical  Provider, MD  alendronate (FOSAMAX) 70 MG tablet Take 70 mg by mouth once a week. Take on Wednesdays with a full glass of water on an empty stomach.    Historical Provider, MD  amLODipine (NORVASC) 10 MG tablet Take 1 tablet (10 mg total) by mouth daily. 11/15/12   Oswald Hillock, MD  Calcium Carbonate Antacid (TUMS PO) Take 1-2 tablets by mouth daily as needed (heartburn).    Historical Provider, MD  cyclobenzaprine (FLEXERIL) 10 MG tablet Take 10 mg by mouth at bedtime as needed for muscle spasms.     Historical Provider, MD  doxycycline (VIBRAMYCIN) 100 MG capsule Take 1 capsule (100 mg total) by mouth 2 (two) times daily. One po bid x 14 days 10/22/14   Hessie Diener Hess, PA-C  enalapril (VASOTEC) 20 MG tablet Take 20 mg by mouth daily. 08/03/14   Historical Provider,  MD  HYDROcodone-acetaminophen (NORCO/VICODIN) 5-325 MG per tablet Take 1 tablet by mouth every 4 (four) hours as needed for moderate pain. Patient not taking: Reported on 10/21/2014 08/29/14   Jola Schmidt, MD  metroNIDAZOLE (FLAGYL) 500 MG tablet Take 1 tablet (500 mg total) by mouth 2 (two) times daily. One po bid x 7 days 10/22/14   Carman Ching, PA-C  mycophenolate (CELLCEPT) 500 MG tablet Take 1,500 mg by mouth 2 (two) times daily.     Historical Provider, MD  ondansetron (ZOFRAN ODT) 4 MG disintegrating tablet 40m ODT q4 hours prn nausea/vomit 10/22/14   Robyn M Hess, PA-C  predniSONE (DELTASONE) 10 MG tablet Take 10 mg by mouth daily with breakfast.     Historical Provider, MD  simvastatin (ZOCOR) 40 MG tablet Take 40 mg by mouth daily.    Historical Provider, MD  traMADol (ULTRAM) 50 MG tablet Take 1 tablet (50 mg total) by mouth every 6 (six) hours as needed. Patient not taking: Reported on 10/21/2014 06/29/14   AVarney Biles MD   BP 148/104 mmHg  Temp(Src) 98.1 F (36.7 C) (Oral)  Resp 16  Ht 5' 3"  (1.6 m)  Wt 230 lb (104.327 kg)  BMI 40.75 kg/m2  SpO2 99%  LMP 10/21/2014 Physical Exam  Constitutional: She is oriented to person, place, and time. She appears well-developed and well-nourished. No distress.  Obese African-American female, nontoxic  HENT:  Head: Atraumatic.  Mouth/Throat: Oropharynx is clear and moist.  Eyes: Conjunctivae are normal.  Neck: Neck supple.  No nuchal rigidity  Cardiovascular:  Tachycardia without murmurs rubs or gallops  Pulmonary/Chest: Effort normal and breath sounds normal.  Abdominal: Soft.  Musculoskeletal: She exhibits no edema.  Neurological: She is alert and oriented to person, place, and time.  Skin: No rash noted.  Psychiatric: She has a normal mood and affect.  Nursing note and vitals reviewed.   ED Course  Procedures (including critical care time)  Patient with history of SVT presenting with tachycardia consistence with SVT. She  also endorsed sharp stabbing chest pain. Workup initiated, IV fluid given. Attempt vasovagal maneuver without success. Heart rate maintains in the 110s.    11:19 AM Heart rates improved to low 100s with IV fluid. At this time, I do not think Adenosine is needed to reduce her heart rate.  Pt sts she feels a bit better, with decrease heart palpitation.  Still endorse chest tightness.  She has had cardiac stress test in the past for similar chest pain which shows no ischemic changes.    1:45 PM Pt still have chest discomfort.  Will continue with  pain management.  Will check D-dimer and check delta trop.   3:58 PM D-dimer elevated at 2.42.  Will obtain VQ scan.  Care discussed with oncoming provider who will f/u on VQ result.  If negative, pt can f/u with her cardiologist for further care.  Doubt ACS causing her sxs.    Labs Review Labs Reviewed  D-DIMER, QUANTITATIVE (NOT AT [facility]) - Abnormal; Notable for the following:    D-Dimer, Quant 2.42 (*)    All other components within normal limits  I-STAT CHEM 8, ED - Abnormal; Notable for the following:    Chloride 112 (*)    BUN 65 (*)    Creatinine, Ser 5.80 (*)    Hemoglobin 9.9 (*)    HCT 29.0 (*)    All other components within normal limits  CBC WITH DIFFERENTIAL/PLATELET  Randolm Idol, ED  POC URINE PREG, ED  Randolm Idol, ED    Imaging Review Dg Chest 2 View  11/13/2014   CLINICAL DATA:  Chest pain and cardiac palpitations  EXAM: CHEST  2 VIEW  COMPARISON:  June 29, 2014  FINDINGS: There is no edema or consolidation. The heart size and pulmonary vascularity are normal. No adenopathy. No bone lesions.  IMPRESSION: No edema or consolidation.   Electronically Signed   By: Lowella Grip III M.D.   On: 11/13/2014 11:02   I, Druscilla Petsch, personally reviewed and evaluated these images and lab results as part of my medical decision-making.   EKG Interpretation   Date/Time:  Tuesday November 13 2014 09:57:33 EDT Ventricular Rate:   115 PR Interval:  133 QRS Duration: 79 QT Interval:  321 QTC Calculation: 444 R Axis:   23 Text Interpretation:  Sinus tachycardia Anteroseptal infarct, old  Nonspecific T abnormalities, lateral leads No significant change since  last tracing Confirmed by KNAPP  MD-J, JON ([id number]) on 11/13/2014 10:01:07  AM      MDM   Final diagnoses:  Other chest pain  Tachycardia  Chronic renal failure, unspecified stage    BP 150/78 mmHg  Pulse 104  Temp(Src) 98.1 F (36.7 C) (Oral)  Resp 17  Ht 5' 3"  (1.6 m)  Wt 230 lb (104.327 kg)  BMI 40.75 kg/m2  SpO2 98%  LMP 10/21/2014     Domenic Moras, PA-C 11/13/14 1601  Dorie Rank, MD 11/13/14 [phone]

## 2014-11-13 NOTE — ED Provider Notes (Signed)
Patient care assumed at shift change awaiting completion of diagnostic testing in the evaluation of chest pain.  Results reviewed and shared with patient.  Renal function within normal range for patient. Normal troponin x 2. ECG without indication of ischemia. Elevated d-dimer, with VQ scan completed--normal ventilation perfusion lung scan, without findings to indicate pulmonary embolus.  Patient is scheduled to see her nephrologist tomorrow. Return precautions discussed.  Etta Quill, NP 11/13/14 2324  Evelina Bucy, MD 11/13/14 [phone]

## 2014-11-13 NOTE — Discharge Instructions (Signed)

## 2014-11-18 ENCOUNTER — Encounter (HOSPITAL_COMMUNITY): Payer: Self-pay | Admitting: Emergency Medicine

## 2014-11-18 ENCOUNTER — Emergency Department (HOSPITAL_COMMUNITY): Payer: Medicaid Other

## 2014-11-18 ENCOUNTER — Emergency Department (HOSPITAL_COMMUNITY)
Admission: EM | Admit: 2014-11-18 | Discharge: 2014-11-18 | Disposition: A | Discharge status: Home / Self Care | Payer: Medicaid Other | Attending: Emergency Medicine | Admitting: Emergency Medicine

## 2014-11-18 DIAGNOSIS — N939 Abnormal uterine and vaginal bleeding, unspecified: Secondary | ICD-10-CM

## 2014-11-18 DIAGNOSIS — Z8669 Personal history of other diseases of the nervous system and sense organs: Secondary | ICD-10-CM | POA: Diagnosis not present

## 2014-11-18 DIAGNOSIS — N184 Chronic kidney disease, stage 4 (severe): Secondary | ICD-10-CM | POA: Insufficient documentation

## 2014-11-18 DIAGNOSIS — R109 Unspecified abdominal pain: Secondary | ICD-10-CM | POA: Diagnosis present

## 2014-11-18 DIAGNOSIS — I129 Hypertensive chronic kidney disease with stage 1 through stage 4 chronic kidney disease, or unspecified chronic kidney disease: Secondary | ICD-10-CM | POA: Diagnosis not present

## 2014-11-18 DIAGNOSIS — R011 Cardiac murmur, unspecified: Secondary | ICD-10-CM | POA: Diagnosis not present

## 2014-11-18 DIAGNOSIS — Z3202 Encounter for pregnancy test, result negative: Secondary | ICD-10-CM | POA: Diagnosis not present

## 2014-11-18 DIAGNOSIS — Z8739 Personal history of other diseases of the musculoskeletal system and connective tissue: Secondary | ICD-10-CM | POA: Diagnosis not present

## 2014-11-18 DIAGNOSIS — Z862 Personal history of diseases of the blood and blood-forming organs and certain disorders involving the immune mechanism: Secondary | ICD-10-CM | POA: Diagnosis not present

## 2014-11-18 DIAGNOSIS — F419 Anxiety disorder, unspecified: Secondary | ICD-10-CM | POA: Insufficient documentation

## 2014-11-18 DIAGNOSIS — Z79899 Other long term (current) drug therapy: Secondary | ICD-10-CM | POA: Insufficient documentation

## 2014-11-18 LAB — URINALYSIS, ROUTINE W REFLEX MICROSCOPIC
BILIRUBIN URINE: NEGATIVE
GLUCOSE, UA: NEGATIVE mg/dL
Ketones, ur: NEGATIVE mg/dL
Leukocytes, UA: NEGATIVE
Nitrite: NEGATIVE
Protein, ur: >300 mg/dL — AB
SPECIFIC GRAVITY, URINE: 1.019 (ref 1.005–1.030)
UROBILINOGEN UA: 0.2 mg/dL (ref 0.0–1.0)
pH: 5 (ref 5.0–8.0)

## 2014-11-18 LAB — CBC WITH DIFFERENTIAL/PLATELET
BASOS PCT: 0 % (ref 0–1)
Basophils Absolute: 0 10*3/uL (ref 0.0–0.1)
EOS ABS: 0 10*3/uL (ref 0.0–0.7)
EOS PCT: 1 % (ref 0–5)
HCT: 27.4 % — ABNORMAL LOW (ref 36.0–46.0)
Hemoglobin: 8.9 g/dL — ABNORMAL LOW (ref 12.0–15.0)
LYMPHS ABS: 0.9 10*3/uL (ref 0.7–4.0)
Lymphocytes Relative: 22 % (ref 12–46)
MCH: 28.3 pg (ref 26.0–34.0)
MCHC: 32.5 g/dL (ref 30.0–36.0)
MCV: 87.3 fL (ref 78.0–100.0)
MONO ABS: 0.4 10*3/uL (ref 0.1–1.0)
MONOS PCT: 10 % (ref 3–12)
Neutro Abs: 2.9 10*3/uL (ref 1.7–7.7)
Neutrophils Relative %: 67 % (ref 43–77)
Platelets: 321 10*3/uL (ref 150–400)
RBC: 3.14 MIL/uL — ABNORMAL LOW (ref 3.87–5.11)
RDW: 14 % (ref 11.5–15.5)
WBC: 4.3 10*3/uL (ref 4.0–10.5)

## 2014-11-18 LAB — BASIC METABOLIC PANEL
Anion gap: 13 (ref 5–15)
BUN: 54 mg/dL — AB (ref 6–20)
CALCIUM: 9.1 mg/dL (ref 8.9–10.3)
CHLORIDE: 111 mmol/L (ref 101–111)
CO2: 15 mmol/L — ABNORMAL LOW (ref 22–32)
CREATININE: 6.08 mg/dL — AB (ref 0.44–1.00)
GFR calc non Af Amer: 8 mL/min — ABNORMAL LOW (ref >60)
GFR, EST AFRICAN AMERICAN: 9 mL/min — AB (ref >60)
Glucose, Bld: 97 mg/dL (ref 65–99)
Potassium: 3.8 mmol/L (ref 3.5–5.1)
SODIUM: 139 mmol/L (ref 135–145)

## 2014-11-18 LAB — WET PREP, GENITAL
CLUE CELLS WET PREP: NONE SEEN
Trich, Wet Prep: NONE SEEN
WBC WET PREP: NONE SEEN
YEAST WET PREP: NONE SEEN

## 2014-11-18 LAB — URINE MICROSCOPIC-ADD ON

## 2014-11-18 LAB — PREGNANCY, URINE: PREG TEST UR: NEGATIVE

## 2014-11-18 MED ORDER — SODIUM CHLORIDE 0.9 % IV BOLUS (SEPSIS)
1000.0000 mL | Freq: Once | INTRAVENOUS | Status: AC
  Administered 2014-11-18: 1000 mL via INTRAVENOUS

## 2014-11-18 MED ORDER — MEGESTROL ACETATE 40 MG PO TABS: 40.0000 mg | ORAL_TABLET | Freq: Every day | ORAL | Status: DC

## 2014-11-18 MED ORDER — HYDROCODONE-ACETAMINOPHEN 5-325 MG PO TABS
1.0000 | ORAL_TABLET | Freq: Once | ORAL | Status: AC
  Administered 2014-11-18: 1 via ORAL
  Filled 2014-11-18: qty 1

## 2014-11-18 MED ORDER — TRAMADOL HCL 50 MG PO TABS: 50.0000 mg | ORAL_TABLET | Freq: Four times a day (QID) | ORAL | Status: DC | PRN

## 2014-11-18 NOTE — ED Provider Notes (Signed)
CSN: [id number]     Arrival date & time 11/18/14  1018 History   First MD Initiated Contact with Patient 11/18/14 1021     Chief Complaint  Patient presents with  . Abdominal Pain  . Vaginal Bleeding     (Consider location/radiation/quality/duration/timing/severity/associated sxs/prior Treatment) HPI Comments: Pt states she started having vaginal bleeding last night. Large amounts of clots. Pt states she always has spotting, but last night was the first time she had an episode where she had continuous bleeding. This morning pt went to bathroom and had another episode of bleeding. Pt states the bleeding is controlled now.  Patient is a 39 y.o. female presenting with abdominal pain and vaginal bleeding. The history is provided by the patient.  Abdominal Pain Pain quality: cramping   Pain radiates to:  Does not radiate Pain severity:  Moderate Onset quality:  Sudden Duration:  1 day Timing:  Intermittent Progression:  Waxing and waning Chronicity:  New Relieved by:  Nothing Worsened by:  Nothing tried Ineffective treatments:  None tried Associated symptoms: vaginal bleeding   Associated symptoms: no diarrhea, no fever, no vaginal discharge and no vomiting   Vaginal Bleeding Quality:  Bright red and clots Severity:  Moderate Onset quality:  Sudden Chronicity:  Recurrent Menstrual history:  Irregular Number of pads used:  2 Possible pregnancy: no   Context: not after urination   Relieved by:  None tried Worsened by:  Nothing tried Ineffective treatments:  None tried Associated symptoms: abdominal pain   Associated symptoms: no fever and no vaginal discharge   Risk factors: no new sexual partner, no prior miscarriage and does not have unprotected sex     Past Medical History  Diagnosis Date  . Hypertension   . Cardiac arrhythmia   . SVT (supraventricular tachycardia)     "today, last week, 2 wk ago; maybe 1 month ago, etc; started w/in last 3-4 yrs"(07/20/2012)  . Fainting      "~ 1 month ago; probably related to SVT" (07/20/2012)  . Heart murmur     "small" (07/20/2012)  . Shortness of breath     "related to SVT episodes" (07/20/2012)  . Anemia     "today" (07/20/2012)  . Chest pain at rest     "related to SVT" (07/20/2012)  . Sleep apnea     "don't wear mask" (07/20/2012)  . Daily headache   . Anxiety   . Lupus (systemic lupus erythematosus)   . Renal disorder   . Chronic renal failure, stage 4 (severe)    Past Surgical History  Procedure Laterality Date  . Appendectomy  2011  . Tubal ligation  2010  . Cesarean section  2001; 2010  . Cervical biopsy  2013  . Ablation      07/21/12 Dr. Cristopher Peru    Family History  Problem Relation Age of Onset  . Cancer Mother     breast and ovarian  . BRCA 1/2 Sister    Social History  Substance Use Topics  . Smoking status: Never Smoker   . Smokeless tobacco: Never Used  . Alcohol Use: No     Comment: 07/20/2012 "might have a beer once/yr"   OB History    Gravida Para Term Preterm AB TAB SAB Ectopic Multiple Living   2 2 2  0 0 0 0 0 0 2     Review of Systems  Constitutional: Negative for fever.  Gastrointestinal: Positive for abdominal pain. Negative for vomiting and diarrhea.  Genitourinary: Positive for  vaginal bleeding. Negative for vaginal discharge.  All other systems reviewed and are negative.     Allergies  Food  Home Medications   Prior to Admission medications   Medication Sig Start Date End Date Taking? Authorizing Provider  acetaminophen (TYLENOL) 500 MG tablet Take 1,000 mg by mouth every 6 (six) hours as needed (pain).     Historical Provider, MD  alendronate (FOSAMAX) 70 MG tablet Take 70 mg by mouth once a week. Take on Wednesdays with a full glass of water on an empty stomach.    Historical Provider, MD  amLODipine (NORVASC) 10 MG tablet Take 1 tablet (10 mg total) by mouth daily. Patient taking differently: Take 10 mg by mouth 2 (two) times daily.  11/15/12   Oswald Hillock, MD   Calcium Carbonate Antacid (TUMS PO) Take 1-2 tablets by mouth daily as needed (heartburn).    Historical Provider, MD  cyclobenzaprine (FLEXERIL) 10 MG tablet Take 10 mg by mouth at bedtime as needed for muscle spasms.     Historical Provider, MD  doxycycline (VIBRAMYCIN) 100 MG capsule Take 1 capsule (100 mg total) by mouth 2 (two) times daily. One po bid x 14 days Patient not taking: Reported on 11/13/2014 10/22/14   Carman Ching, PA-C  famotidine (PEPCID) 40 MG tablet Take 1 tablet (40 mg total) by mouth daily. 11/13/14   Etta Quill, NP  HYDROcodone-acetaminophen (NORCO/VICODIN) 5-325 MG per tablet Take 1 tablet by mouth every 4 (four) hours as needed for moderate pain. Patient not taking: Reported on 10/21/2014 08/29/14   Jola Schmidt, MD  megestrol (MEGACE) 40 MG tablet Take 1 tablet (40 mg total) by mouth daily. Take 1 tablet (19m) PO BID x 7 days. Take 1 tablet (466m daily until seen by Ob/Gyn 11/18/14   JeBaron SanePA-C  metoprolol succinate (TOPROL-XL) 25 MG 24 hr tablet Take 25 mg by mouth daily.    Historical Provider, MD  metroNIDAZOLE (FLAGYL) 500 MG tablet Take 1 tablet (500 mg total) by mouth 2 (two) times daily. One po bid x 7 days Patient not taking: Reported on 11/13/2014 10/22/14   RoCarman ChingPA-C  mycophenolate (CELLCEPT) 500 MG tablet Take 1,000 mg by mouth 2 (two) times daily.     Historical Provider, MD  ondansetron (ZOFRAN ODT) 4 MG disintegrating tablet 55m48mDT q4 hours prn nausea/vomit 10/22/14   Robyn M Hess, PA-C  predniSONE (DELTASONE) 10 MG tablet Take 5 mg by mouth daily with breakfast.     Historical Provider, MD  simvastatin (ZOCOR) 40 MG tablet Take 40 mg by mouth daily.    Historical Provider, MD  traMADol (ULTRAM) 50 MG tablet Take 1 tablet (50 mg total) by mouth every 6 (six) hours as needed. Patient not taking: Reported on 10/21/2014 06/29/14   AnkVarney BilesD  traMADol (ULTRAM) 50 MG tablet Take 1 tablet (50 mg total) by mouth every 6 (six) hours as  needed. 11/18/14   Novis League, PA-C   BP 177/83 mmHg  Pulse 93  Temp(Src) 97.8 F (36.6 C) (Oral)  Resp 17  Ht 5' 3"  (1.6 m)  Wt 232 lb (105.235 kg)  BMI 41.11 kg/m2  SpO2 100%  LMP 11/17/2014 Physical Exam  Constitutional: She is oriented to person, place, and time. She appears well-developed and well-nourished. No distress.  HENT:  Head: Normocephalic and atraumatic.  Right Ear: External ear normal.  Left Ear: External ear normal.  Nose: Nose normal.  Mouth/Throat: Oropharynx is clear and moist.  Eyes: Conjunctivae are normal.  Neck: Normal range of motion. Neck supple.  No nuchal rigidity.   Cardiovascular: Normal rate, regular rhythm and normal heart sounds.   Pulmonary/Chest: Effort normal and breath sounds normal.  Abdominal: Soft. There is no tenderness.  Musculoskeletal: Normal range of motion.  Neurological: She is alert and oriented to person, place, and time.  Skin: Skin is warm and dry. She is not diaphoretic.  Psychiatric: She has a normal mood and affect.  Nursing note and vitals reviewed.  Exam performed by Baron Sane L,  exam chaperoned Date: 11/18/2014 Pelvic exam: normal external genitalia without evidence of trauma. VULVA: normal appearing vulva with no masses, tenderness or lesion. VAGINA: normal appearing vagina with normal color and discharge, no lesions. CERVIX: normal appearing cervix without lesions, cervical motion tenderness absent, cervical os closed with out purulent discharge; vaginal discharge - bloody, Wet prep and DNA probe for chlamydia and GC obtained.   ADNEXA: normal adnexa in size, nontender and no masses UTERUS: uterus is normal size, shape, consistency and nontender.    ED Course  Procedures (including critical care time) Labs Review Labs Reviewed  CBC WITH DIFFERENTIAL/PLATELET - Abnormal; Notable for the following:    RBC 3.14 (*)    Hemoglobin 8.9 (*)    HCT 27.4 (*)    All other components within normal  limits  BASIC METABOLIC PANEL - Abnormal; Notable for the following:    CO2 15 (*)    BUN 54 (*)    Creatinine, Ser 6.08 (*)    GFR calc non Af Amer 8 (*)    GFR calc Af Amer 9 (*)    All other components within normal limits  URINALYSIS, ROUTINE W REFLEX MICROSCOPIC (NOT AT [facility]) - Abnormal; Notable for the following:    APPearance CLOUDY (*)    Hgb urine dipstick LARGE (*)    Protein, ur >300 (*)    All other components within normal limits  WET PREP, GENITAL  PREGNANCY, URINE  URINE MICROSCOPIC-ADD ON  RPR  HIV ANTIBODY (ROUTINE TESTING)  GC/CHLAMYDIA PROBE AMP ([facility]) NOT AT [facility]    Imaging Review US Transvaginal Non-ob  11/18/2014   CLINICAL DATA:  Patient with heavy vaginal bleeding.  EXAM: TRANSABDOMINAL AND TRANSVAGINAL ULTRASOUND OF PELVIS  TECHNIQUE: Both transabdominal and transvaginal ultrasound examinations of the pelvis were performed. Transabdominal technique was performed for global imaging of the pelvis including uterus, ovaries, adnexal regions, and pelvic cul-de-sac. It was necessary to proceed with endovaginal exam following the transabdominal exam to visualize the endometrium.  COMPARISON:  None  FINDINGS: Uterus  Measurements: 7.4 x 4.7 x 4.5 cm. Within the posterior uterine body there is a 1.1 x 1.2 x 1.1 cm hypoechoic mass most compatible with intramural fibroid.  Endometrium  Thickness: 6 mm.  No focal abnormality visualized.  Right ovary  Measurements: 3.5 x 1.6 x 2.8 cm. Multiple follicles.  Left ovary  Measurements: 2.8 x 2.1 x 2.6 cm. Multiple follicles.  Other findings  No free fluid.  IMPRESSION: Endometrium measures 6 mm. If bleeding remains unresponsive to hormonal or medical therapy, sonohysterogram should be considered for focal lesion work-up. (Ref: Radiological Reasoning: Algorithmic Workup of Abnormal Vaginal Bleeding with Endovaginal Sonography and Sonohysterography. AJR 2008; 811:B14-78)  Fibroid uterus.   Electronically Signed   By: Lovey Newcomer  M.D.   On: 11/18/2014 14:14   US Pelvis Complete  11/18/2014   CLINICAL DATA:  Patient with heavy vaginal bleeding.  EXAM: TRANSABDOMINAL AND TRANSVAGINAL ULTRASOUND  OF PELVIS  TECHNIQUE: Both transabdominal and transvaginal ultrasound examinations of the pelvis were performed. Transabdominal technique was performed for global imaging of the pelvis including uterus, ovaries, adnexal regions, and pelvic cul-de-sac. It was necessary to proceed with endovaginal exam following the transabdominal exam to visualize the endometrium.  COMPARISON:  None  FINDINGS: Uterus  Measurements: 7.4 x 4.7 x 4.5 cm. Within the posterior uterine body there is a 1.1 x 1.2 x 1.1 cm hypoechoic mass most compatible with intramural fibroid.  Endometrium  Thickness: 6 mm.  No focal abnormality visualized.  Right ovary  Measurements: 3.5 x 1.6 x 2.8 cm. Multiple follicles.  Left ovary  Measurements: 2.8 x 2.1 x 2.6 cm. Multiple follicles.  Other findings  No free fluid.  IMPRESSION: Endometrium measures 6 mm. If bleeding remains unresponsive to hormonal or medical therapy, sonohysterogram should be considered for focal lesion work-up. (Ref: Radiological Reasoning: Algorithmic Workup of Abnormal Vaginal Bleeding with Endovaginal Sonography and Sonohysterography. AJR 2008; 643:P29-51)  Fibroid uterus.   Electronically Signed   By: Lovey Newcomer M.D.   On: 11/18/2014 14:14   I have personally reviewed and evaluated these images and lab results as part of my medical decision-making.   EKG Interpretation None      2:51 PM Discussed patient case with Dr. Gala Romney of [place]. Recommends Megace 20m BID x 7 days then 451mdaily until seen in the clinic for follow up in 1-2 weeks. Office will call patient for follow up appointment.   MDM   Final diagnoses:  Abnormal uterine bleeding    Filed Vitals:   11/18/14 1515  BP: 177/83  Pulse:   Temp: 97.8 F (36.6 C)  Resp: 17   Afebrile, NAD, non-toxic appearing, AAOx4.   Patient  presenting with 1 day history of intermittent heavy vaginal bleeding with associated cramping pelvic pain. Abdomen soft, non-tender, non-distended. No peritoneal signs. On pelvic exam, cervical os closed with clot noted, no cervical abnormality. No adnexal tenderness or mass appreciated. USKoreabtained with evidence of fibroids. Discussed anemia any symptoms with Dr. LeGala Romneyf Ob/Gyn who recommends d/c home with megace and ultram with outpatient follow up in the clinic. Patient is hemodynamically stable and agreeable to d/c home. Return precautions discussed. Patient is agreeable to plan. Patient is stable at time of discharge     JeBaron SanePA-C 11/18/14 16[place] RiverMD 11/18/14 [phone]

## 2014-11-18 NOTE — ED Notes (Signed)
Pt states she started having vaginal bleeding last night. Large amounts of clots. Pt states she always has spotting, but last night was the first time she had an episode where she had continuous bleeding. This morning pt went to bathroom and had another episode of bleeding. Pt states the bleeding is controlled now.

## 2014-11-18 NOTE — ED Notes (Signed)
Pt in ultrasound

## 2014-11-18 NOTE — Discharge Instructions (Signed)
Please follow up with your Ob/Gyn to schedule a follow up appointment. Please take Megace twice daily for 1 week. Then take Megace once daily until seen by your OB/GYN. Please take pain medication and/or muscle relaxants as prescribed and as needed for pain. Please do not drive on narcotic pain medication or on muscle relaxants. Please read all discharge instructions and return precautions.    Abnormal Uterine Bleeding Abnormal uterine bleeding can affect women at various stages in life, including teenagers, women in their reproductive years, pregnant women, and women who have reached menopause. Several kinds of uterine bleeding are considered abnormal, including:  Bleeding or spotting between periods.   Bleeding after sexual intercourse.   Bleeding that is heavier or more than normal.   Periods that last longer than usual.  Bleeding after menopause.  Many cases of abnormal uterine bleeding are minor and simple to treat, while others are more serious. Any type of abnormal bleeding should be evaluated by your health care provider. Treatment will depend on the cause of the bleeding. HOME CARE INSTRUCTIONS Monitor your condition for any changes. The following actions may help to alleviate any discomfort you are experiencing:  Avoid the use of tampons and douches as directed by your health care provider.  Change your pads frequently. You should get regular pelvic exams and Pap tests. Keep all follow-up appointments for diagnostic tests as directed by your health care provider.  SEEK MEDICAL CARE IF:   Your bleeding lasts more than 1 week.   You feel dizzy at times.  SEEK IMMEDIATE MEDICAL CARE IF:   You pass out.   You are changing pads every 15 to 30 minutes.   You have abdominal pain.  You have a fever.   You become sweaty or weak.   You are passing large blood clots from the vagina.   You start to feel nauseous and vomit. MAKE SURE YOU:   Understand these  instructions.  Will watch your condition.  Will get help right away if you are not doing well or get worse. Document Released: 03/16/2005 Document Revised: 03/21/2013 Document Reviewed: 10/13/2012 [facility] Patient Information 2015 [place], [place]. This information is not intended to replace advice given to you by your health care provider. Make sure you discuss any questions you have with your health care provider.

## 2014-11-19 ENCOUNTER — Other Ambulatory Visit (HOSPITAL_COMMUNITY): Payer: Self-pay | Admitting: Nephrology

## 2014-11-19 DIAGNOSIS — N183 Chronic kidney disease, stage 3 (moderate): Secondary | ICD-10-CM

## 2014-11-19 LAB — GC/CHLAMYDIA PROBE AMP (~~LOC~~) NOT AT ARMC
Chlamydia: NEGATIVE
Neisseria Gonorrhea: NEGATIVE

## 2014-11-19 LAB — HIV ANTIBODY (ROUTINE TESTING W REFLEX): HIV SCREEN 4TH GENERATION: NONREACTIVE

## 2014-11-19 LAB — RPR: RPR: NONREACTIVE

## 2014-11-21 ENCOUNTER — Other Ambulatory Visit: Payer: Self-pay | Admitting: Radiology

## 2014-11-22 ENCOUNTER — Encounter (HOSPITAL_COMMUNITY): Payer: Self-pay

## 2014-11-22 ENCOUNTER — Ambulatory Visit (HOSPITAL_COMMUNITY)
Admission: RE | Admit: 2014-11-22 | Discharge: 2014-11-22 | Disposition: A | Discharge status: Home / Self Care | Payer: Medicaid Other | Source: Ambulatory Visit | Attending: Nephrology | Admitting: Nephrology

## 2014-11-22 DIAGNOSIS — M3214 Glomerular disease in systemic lupus erythematosus: Secondary | ICD-10-CM | POA: Insufficient documentation

## 2014-11-22 DIAGNOSIS — G4730 Sleep apnea, unspecified: Secondary | ICD-10-CM | POA: Insufficient documentation

## 2014-11-22 DIAGNOSIS — N183 Chronic kidney disease, stage 3 (moderate): Secondary | ICD-10-CM | POA: Diagnosis present

## 2014-11-22 DIAGNOSIS — I129 Hypertensive chronic kidney disease with stage 1 through stage 4 chronic kidney disease, or unspecified chronic kidney disease: Secondary | ICD-10-CM | POA: Insufficient documentation

## 2014-11-22 DIAGNOSIS — Z79899 Other long term (current) drug therapy: Secondary | ICD-10-CM | POA: Insufficient documentation

## 2014-11-22 LAB — CBC
HCT: 23 % — ABNORMAL LOW (ref 36.0–46.0)
Hemoglobin: 7.6 g/dL — ABNORMAL LOW (ref 12.0–15.0)
MCH: 28.8 pg (ref 26.0–34.0)
MCHC: 33 g/dL (ref 30.0–36.0)
MCV: 87.1 fL (ref 78.0–100.0)
PLATELETS: 286 10*3/uL (ref 150–400)
RBC: 2.64 MIL/uL — AB (ref 3.87–5.11)
RDW: 14.2 % (ref 11.5–15.5)
WBC: 4.9 10*3/uL (ref 4.0–10.5)

## 2014-11-22 LAB — APTT: APTT: 27 s (ref 24–37)

## 2014-11-22 LAB — PROTIME-INR
INR: 0.96 (ref 0.00–1.49)
PROTHROMBIN TIME: 13 s (ref 11.6–15.2)

## 2014-11-22 MED ORDER — MIDAZOLAM HCL 2 MG/2ML IJ SOLN
INTRAMUSCULAR | Status: AC | PRN
Start: 2014-11-22 — End: 2014-11-22
  Administered 2014-11-22: 0.5 mg via INTRAVENOUS
  Administered 2014-11-22: 1 mg via INTRAVENOUS

## 2014-11-22 MED ORDER — MIDAZOLAM HCL 5 MG/5ML IJ SOLN
INTRAMUSCULAR | Status: AC | PRN
  Administered 2014-11-22: 1 mg via INTRAVENOUS
  Administered 2014-11-22: 0.5 mg via INTRAVENOUS

## 2014-11-22 MED ORDER — SODIUM CHLORIDE 0.9 % IV SOLN: INTRAVENOUS | Status: DC

## 2014-11-22 MED ORDER — FENTANYL CITRATE (PF) 100 MCG/2ML IJ SOLN
INTRAMUSCULAR | Status: AC | PRN
  Administered 2014-11-22: 50 ug via INTRAVENOUS
  Administered 2014-11-22 (×2): 25 ug via INTRAVENOUS

## 2014-11-22 MED ORDER — MIDAZOLAM HCL 2 MG/2ML IJ SOLN
INTRAMUSCULAR | Status: AC
  Filled 2014-11-22: qty 4

## 2014-11-22 MED ORDER — FENTANYL CITRATE (PF) 100 MCG/2ML IJ SOLN
INTRAMUSCULAR | Status: DC
Start: 2014-11-22 — End: 2014-11-23
  Filled 2014-11-22: qty 2

## 2014-11-22 MED ORDER — LIDOCAINE HCL (PF) 1 % IJ SOLN
INTRAMUSCULAR | Status: AC
  Filled 2014-11-22: qty 10

## 2014-11-22 NOTE — Sedation Documentation (Signed)
Patient denies pain and is resting comfortably.  

## 2014-11-22 NOTE — Discharge Instructions (Signed)
Kidney Biopsy, Care After °Refer to this sheet in the next few weeks. These instructions provide you with information on caring for yourself after your procedure. Your health care provider may also give you more specific instructions. Your treatment has been planned according to current medical practices, but problems sometimes occur. Call your health care provider if you have any problems or questions after your procedure.  °WHAT TO EXPECT AFTER THE PROCEDURE  °· You may notice blood in the urine for the first 24 hours after the biopsy. °· You may feel some pain at the biopsy site for 1-2 weeks after the biopsy. °HOME CARE INSTRUCTIONS °· Do not lift anything heavier than 10 lb (4.5 kg) for 2 weeks. °· Do not take any non-steroidal anti-inflammatory drugs (NSAIDs) or any blood thinners for a week after the biopsy unless instructed to do so by your health care provider. °· Only take medicines for pain, fever, or discomfort as directed by your health care provider. °SEEK MEDICAL CARE IF: °· You have bloody urine more than 24 hours after the biopsy.   °· You develop a fever.   °· You cannot urinate.   °· You have increasing pain at the biopsy site.   °SEEK IMMEDIATE MEDICAL CARE IF: °You feel faint or dizzy.  °Document Released: 11/16/2012 Document Reviewed: 11/16/2012 °[employer]® Patient Information ©2015 [employer]. This information is not intended to replace advice given to you by your health care provider. Make sure you discuss any questions you have with your health care provider. ° °

## 2014-11-22 NOTE — Procedures (Signed)
Interventional Radiology Procedure Note  Procedure: US guided bx of right kidney.  2 x 16 g core.  Gelfoam deposited  Complications: None Recommendations:  - Ok to shower tomorrow - Do not submerge for 7 days - Routine care   Signed,  Dulcy Fanny. Earleen Newport, DO

## 2014-11-22 NOTE — H&P (Signed)
Chief Complaint: Patient was seen in consultation today for worsening renal function/CKDIII at the request of [place]  Referring Physician(s): Powell,Alvin C  History of Present Illness: [name] is a 39 y.o. 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 NO ACTIVITY IDENTIFIED. MODERATE TO SEVERE TUBULOINTERSTITIAL SCARRING.  Worsening renal function Now for another random renal biopsy   Past Medical History  Diagnosis Date  . Hypertension   . Cardiac arrhythmia   . SVT (supraventricular tachycardia)     "today, last week, 2 wk ago; maybe 1 month ago, etc; started w/in last 3-4 yrs"(07/20/2012)  . Fainting     "~ 1 month ago; probably related to SVT" (07/20/2012)  . Heart murmur     "small" (07/20/2012)  . Shortness of breath     "related to SVT episodes" (07/20/2012)  . Anemia     "today" (07/20/2012)  . Chest pain at rest     "related to SVT" (07/20/2012)  . Sleep apnea     "don't wear mask" (07/20/2012)  . Daily headache   . Anxiety   . Lupus (systemic lupus erythematosus)   . Renal disorder   . Chronic renal failure, stage 4 (severe)     Past Surgical History  Procedure Laterality Date  . Appendectomy  2011  . Tubal ligation  2010  . Cesarean section  2001; 2010  . Cervical biopsy  2013  . Ablation      07/21/12 Dr. Cristopher Peru     Allergies: Food  Medications: Prior to Admission medications   Medication Sig Start Date End Date Taking? Authorizing Provider  acetaminophen (TYLENOL) 500 MG tablet Take 1,000 mg by mouth every 6 (six) hours as needed (pain).    Yes Historical Provider, MD  alendronate (FOSAMAX) 70 MG tablet Take 70 mg by mouth once a week. Take on Wednesdays with a full glass of water on an empty stomach.   Yes  Historical Provider, MD  amLODipine (NORVASC) 10 MG tablet Take 1 tablet (10 mg total) by mouth daily. Patient taking differently: Take 10 mg by mouth 2 (two) times daily.  11/15/12  Yes Oswald Hillock, MD  cyclobenzaprine (FLEXERIL) 10 MG tablet Take 10 mg by mouth at bedtime as needed for muscle spasms.    Yes Historical Provider, MD  famotidine (PEPCID) 40 MG tablet Take 1 tablet (40 mg total) by mouth daily. 11/13/14  Yes Etta Quill, NP  megestrol (MEGACE) 40 MG tablet Take 1 tablet (40 mg total) by mouth daily. Take 1 tablet (25m) PO BID x 7 days. Take 1 tablet (4130m daily until seen by Ob/Gyn 11/18/14  Yes JeAnderson Maltaiepenbrink, PA-C  metoprolol succinate (TOPROL-XL) 25 MG 24 hr tablet Take 25 mg by mouth daily.   Yes Historical Provider, MD  mycophenolate (CELLCEPT) 500 MG tablet Take 1,000 mg by mouth 2 (two) times daily.    Yes Historical Provider, MD  ondansetron (ZOFRAN ODT) 4 MG disintegrating tablet 30m630mDT q4 hours prn nausea/vomit 10/22/14  Yes Robyn M Hess, PA-C  predniSONE (DELTASONE) 10 MG tablet Take 5 mg by mouth daily with breakfast.    Yes Historical Provider, MD  simvastatin (ZOCOR) 40 MG tablet Take 40 mg by mouth daily.   Yes Historical Provider, MD  traMADol (ULVeatrice Bourbon0  MG tablet Take 1 tablet (50 mg total) by mouth every 6 (six) hours as needed. 11/18/14  Yes Jennifer Piepenbrink, PA-C  Calcium Carbonate Antacid (TUMS PO) Take 1-2 tablets by mouth daily as needed (heartburn).    Historical Provider, MD  traMADol (ULTRAM) 50 MG tablet Take 1 tablet (50 mg total) by mouth every 6 (six) hours as needed. Patient not taking: Reported on 10/21/2014 06/29/14   Varney Biles, MD     Family History  Problem Relation Age of Onset  . Cancer Mother     breast and ovarian  . BRCA 1/2 Sister     Social History   Social History  . Marital Status: Single    Spouse Name: N/A  . Number of Children: N/A  . Years of Education: N/A   Social History Main Topics  . Smoking status: Never  Smoker   . Smokeless tobacco: Never Used  . Alcohol Use: No     Comment: 07/20/2012 "might have a beer once/yr"  . Drug Use: No  . Sexual Activity: Not Currently    Birth Control/ Protection: None   Other Topics Concern  . None   Social History Narrative    Review of Systems: A 12 point ROS discussed and pertinent positives are indicated in the HPI above.  All other systems are negative.  Review of Systems  Constitutional: Positive for fatigue. Negative for activity change.  Respiratory: Negative for chest tightness and shortness of breath.   Cardiovascular: Negative for chest pain.  Gastrointestinal: Positive for abdominal distention.  Neurological: Positive for weakness.  Psychiatric/Behavioral: Negative for behavioral problems and confusion.    Vital Signs: BP 187/95 mmHg  Pulse 105  Temp(Src) 97.9 F (36.6 C)  Resp 20  Ht 5' 3"  (1.6 m)  Wt 232 lb (105.235 kg)  BMI 41.11 kg/m2  SpO2 99%  LMP 11/17/2014  Physical Exam  Constitutional: She is oriented to person, place, and time.  Cardiovascular: Normal rate and regular rhythm.   No murmur heard. Pulmonary/Chest: Effort normal and breath sounds normal. She has no wheezes.  Abdominal: Soft. Bowel sounds are normal. She exhibits distension. There is no tenderness.  Musculoskeletal: Normal range of motion.  Neurological: She is alert and oriented to person, place, and time.  Skin: Skin is warm and dry.  Psychiatric: She has a normal mood and affect. Her behavior is normal. Judgment and thought content normal.  Nursing note and vitals reviewed.   Mallampati Score:  MD Evaluation Airway: WNL Heart: WNL Abdomen: WNL Chest/ Lungs: WNL ASA  Classification: 3 Mallampati/Airway Score: One  Imaging: Dg Chest 2 View  11/13/2014   CLINICAL DATA:  Chest pain and cardiac palpitations  EXAM: CHEST  2 VIEW  COMPARISON:  June 29, 2014  FINDINGS: There is no edema or consolidation. The heart size and pulmonary vascularity  are normal. No adenopathy. No bone lesions.  IMPRESSION: No edema or consolidation.   Electronically Signed   By: Lowella Grip III M.D.   On: 11/13/2014 11:02   US Transvaginal Non-ob  11/18/2014   CLINICAL DATA:  Patient with heavy vaginal bleeding.  EXAM: TRANSABDOMINAL AND TRANSVAGINAL ULTRASOUND OF PELVIS  TECHNIQUE: Both transabdominal and transvaginal ultrasound examinations of the pelvis were performed. Transabdominal technique was performed for global imaging of the pelvis including uterus, ovaries, adnexal regions, and pelvic cul-de-sac. It was necessary to proceed with endovaginal exam following the transabdominal exam to visualize the endometrium.  COMPARISON:  None  FINDINGS: Uterus  Measurements: 7.4 x  4.7 x 4.5 cm. Within the posterior uterine body there is a 1.1 x 1.2 x 1.1 cm hypoechoic mass most compatible with intramural fibroid.  Endometrium  Thickness: 6 mm.  No focal abnormality visualized.  Right ovary  Measurements: 3.5 x 1.6 x 2.8 cm. Multiple follicles.  Left ovary  Measurements: 2.8 x 2.1 x 2.6 cm. Multiple follicles.  Other findings  No free fluid.  IMPRESSION: Endometrium measures 6 mm. If bleeding remains unresponsive to hormonal or medical therapy, sonohysterogram should be considered for focal lesion work-up. (Ref: Radiological Reasoning: Algorithmic Workup of Abnormal Vaginal Bleeding with Endovaginal Sonography and Sonohysterography. AJR 2008; 696:V89-38)  Fibroid uterus.   Electronically Signed   By: Lovey Newcomer M.D.   On: 11/18/2014 14:14   US Pelvis Complete  11/18/2014   CLINICAL DATA:  Patient with heavy vaginal bleeding.  EXAM: TRANSABDOMINAL AND TRANSVAGINAL ULTRASOUND OF PELVIS  TECHNIQUE: Both transabdominal and transvaginal ultrasound examinations of the pelvis were performed. Transabdominal technique was performed for global imaging of the pelvis including uterus, ovaries, adnexal regions, and pelvic cul-de-sac. It was necessary to proceed with endovaginal exam  following the transabdominal exam to visualize the endometrium.  COMPARISON:  None  FINDINGS: Uterus  Measurements: 7.4 x 4.7 x 4.5 cm. Within the posterior uterine body there is a 1.1 x 1.2 x 1.1 cm hypoechoic mass most compatible with intramural fibroid.  Endometrium  Thickness: 6 mm.  No focal abnormality visualized.  Right ovary  Measurements: 3.5 x 1.6 x 2.8 cm. Multiple follicles.  Left ovary  Measurements: 2.8 x 2.1 x 2.6 cm. Multiple follicles.  Other findings  No free fluid.  IMPRESSION: Endometrium measures 6 mm. If bleeding remains unresponsive to hormonal or medical therapy, sonohysterogram should be considered for focal lesion work-up. (Ref: Radiological Reasoning: Algorithmic Workup of Abnormal Vaginal Bleeding with Endovaginal Sonography and Sonohysterography. AJR 2008; 101:B51-02)  Fibroid uterus.   Electronically Signed   By: Lovey Newcomer M.D.   On: 11/18/2014 14:14   Nm Pulmonary Perf And Vent  11/13/2014   CLINICAL DATA:  Shortness of breath. Tachycardia. Renal failure. Lupus.  EXAM: NUCLEAR MEDICINE VENTILATION - PERFUSION LUNG SCAN  TECHNIQUE: Ventilation images were obtained in multiple projections using inhaled aerosol Tc-50mDTPA. Perfusion images were obtained in multiple projections after intravenous injection of Tc-927mAA.  RADIOPHARMACEUTICALS:  Forty Technetium-9929mPA aerosol inhalation and 6 Technetium-1m58m IV  COMPARISON:  11/13/2014 chest radiograph  FINDINGS: Ventilation: No focal ventilation defect.  Perfusion: No wedge shaped peripheral perfusion defects to suggest acute pulmonary embolism.  IMPRESSION: 1. Normal ventilation perfusion lung scan, without findings to indicate pulmonary embolus.   Electronically Signed   By: WaltVan Clines.   On: 11/13/2014 18:11    Labs:  CBC:  Recent Labs  10/21/14 2003 11/13/14 1010 11/13/14 1051 11/18/14 1043 11/22/14 1316  WBC 3.3* 4.2  --  4.3 4.9  HGB 10.9* 9.1* 9.9* 8.9* 7.6*  HCT 34.2* 27.8* 29.0* 27.4* 23.0*    PLT 248 269  --  321 286    COAGS:  Recent Labs  09/03/14 0830  INR 0.88  APTT 26    BMP:  Recent Labs  06/29/14 0048 08/29/14 1127 10/21/14 2003 11/13/14 1051 11/18/14 1043  NA 139 140 138 138 139  K 3.4* 3.1* 3.0* 4.4 3.8  CL 103 105 107 112* 111  CO2 24 26 20*  --  15*  GLUCOSE 94 90 92 85 97  BUN 25* 15 16 65* 54*  CALCIUM  9.2 8.9 8.9  --  9.1  CREATININE 1.88* 1.66* 4.54* 5.80* 6.08*  GFRNONAA 33* 38* 11*  --  8*  GFRAA 38* 44* 13*  --  9*    LIVER FUNCTION TESTS:  Recent Labs  01/07/14 0803 10/21/14 2003  BILITOT <0.2* 0.6  AST 35 42*  ALT 15 13*  ALKPHOS 40 52  PROT 6.7 7.1  ALBUMIN 2.4* 2.8*    TUMOR MARKERS: No results for input(s): AFPTM, CEA, CA199, CHROMGRNA in the last 8760 hours.  Assessment and Plan:  SLE CKD III Continued worsening renal fxn Scheduled for new random renal bx (previous 08/2014---see report) Risks and Benefits discussed with the patient including, but not limited to bleeding, infection, damage to adjacent structures or low yield requiring additional tests. All of the patient's questions were answered, patient is agreeable to proceed. Consent signed and in chart.   Thank you for this interesting consult.  I greatly enjoyed meeting [name] and look forward to participating in their care.  A copy of this report was sent to the requesting provider on this date.  Signed: Kenniya Westrich A 11/22/2014, 1:45 PM   I spent a total of  30 Minutes   in face to face in clinical consultation, greater than 50% of which was counseling/coordinating care for random renal bx

## 2014-11-22 NOTE — Sedation Documentation (Signed)
Patient is resting comfortably. 

## 2014-11-26 ENCOUNTER — Encounter (HOSPITAL_COMMUNITY): Payer: Self-pay | Admitting: Vascular Surgery

## 2014-11-26 ENCOUNTER — Emergency Department (HOSPITAL_COMMUNITY): Payer: Medicaid Other

## 2014-11-26 ENCOUNTER — Emergency Department (HOSPITAL_COMMUNITY)
Admission: EM | Admit: 2014-11-26 | Discharge: 2014-11-26 | Disposition: A | Discharge status: Home / Self Care | Payer: Medicaid Other | Attending: Emergency Medicine | Admitting: Emergency Medicine

## 2014-11-26 DIAGNOSIS — R109 Unspecified abdominal pain: Secondary | ICD-10-CM | POA: Diagnosis present

## 2014-11-26 DIAGNOSIS — F419 Anxiety disorder, unspecified: Secondary | ICD-10-CM | POA: Diagnosis not present

## 2014-11-26 DIAGNOSIS — Z79899 Other long term (current) drug therapy: Secondary | ICD-10-CM | POA: Insufficient documentation

## 2014-11-26 DIAGNOSIS — Z862 Personal history of diseases of the blood and blood-forming organs and certain disorders involving the immune mechanism: Secondary | ICD-10-CM | POA: Insufficient documentation

## 2014-11-26 DIAGNOSIS — Z3202 Encounter for pregnancy test, result negative: Secondary | ICD-10-CM | POA: Diagnosis not present

## 2014-11-26 DIAGNOSIS — I129 Hypertensive chronic kidney disease with stage 1 through stage 4 chronic kidney disease, or unspecified chronic kidney disease: Secondary | ICD-10-CM | POA: Diagnosis not present

## 2014-11-26 DIAGNOSIS — R011 Cardiac murmur, unspecified: Secondary | ICD-10-CM | POA: Diagnosis not present

## 2014-11-26 DIAGNOSIS — N184 Chronic kidney disease, stage 4 (severe): Secondary | ICD-10-CM | POA: Diagnosis not present

## 2014-11-26 DIAGNOSIS — Z7952 Long term (current) use of systemic steroids: Secondary | ICD-10-CM | POA: Insufficient documentation

## 2014-11-26 LAB — CBC
HEMATOCRIT: 20.6 % — AB (ref 36.0–46.0)
HEMOGLOBIN: 6.8 g/dL — AB (ref 12.0–15.0)
MCH: 28.9 pg (ref 26.0–34.0)
MCHC: 33 g/dL (ref 30.0–36.0)
MCV: 87.7 fL (ref 78.0–100.0)
Platelets: 256 10*3/uL (ref 150–400)
RBC: 2.35 MIL/uL — AB (ref 3.87–5.11)
RDW: 14.6 % (ref 11.5–15.5)
WBC: 6.8 10*3/uL (ref 4.0–10.5)

## 2014-11-26 LAB — COMPREHENSIVE METABOLIC PANEL
ALT: 21 U/L (ref 14–54)
AST: 42 U/L — ABNORMAL HIGH (ref 15–41)
Albumin: 2.9 g/dL — ABNORMAL LOW (ref 3.5–5.0)
Alkaline Phosphatase: 37 U/L — ABNORMAL LOW (ref 38–126)
Anion gap: 10 (ref 5–15)
BUN: 48 mg/dL — ABNORMAL HIGH (ref 6–20)
CHLORIDE: 115 mmol/L — AB (ref 101–111)
CO2: 15 mmol/L — ABNORMAL LOW (ref 22–32)
Calcium: 8.9 mg/dL (ref 8.9–10.3)
Creatinine, Ser: 6.23 mg/dL — ABNORMAL HIGH (ref 0.44–1.00)
GFR, EST AFRICAN AMERICAN: 9 mL/min — AB (ref >60)
GFR, EST NON AFRICAN AMERICAN: 8 mL/min — AB (ref >60)
Glucose, Bld: 83 mg/dL (ref 65–99)
POTASSIUM: 4.2 mmol/L (ref 3.5–5.1)
SODIUM: 140 mmol/L (ref 135–145)
Total Bilirubin: 0.4 mg/dL (ref 0.3–1.2)
Total Protein: 6.6 g/dL (ref 6.5–8.1)

## 2014-11-26 LAB — URINALYSIS, ROUTINE W REFLEX MICROSCOPIC
Bilirubin Urine: NEGATIVE
Glucose, UA: NEGATIVE mg/dL
KETONES UR: NEGATIVE mg/dL
NITRITE: NEGATIVE
PH: 6 (ref 5.0–8.0)
Protein, ur: >300 mg/dL — AB
SPECIFIC GRAVITY, URINE: 1.025 (ref 1.005–1.030)
Urobilinogen, UA: 0.2 mg/dL (ref 0.0–1.0)

## 2014-11-26 LAB — URINE MICROSCOPIC-ADD ON

## 2014-11-26 LAB — POC URINE PREG, ED: Preg Test, Ur: NEGATIVE

## 2014-11-26 MED ORDER — OXYCODONE-ACETAMINOPHEN 5-325 MG PO TABS: 1.0000 | ORAL_TABLET | ORAL | Status: DC | PRN

## 2014-11-26 MED ORDER — OXYCODONE-ACETAMINOPHEN 5-325 MG PO TABS
1.0000 | ORAL_TABLET | Freq: Once | ORAL | Status: AC
  Administered 2014-11-26: 1 via ORAL
  Filled 2014-11-26: qty 1

## 2014-11-26 NOTE — ED Notes (Signed)
Pt reports to the ED for eval left flank pain. She had a renal biopsy done on 8/25. She reports the day after she developed severe pain and swelling. She also reports that on Saturday she developed nausea and diarrhea. She not on any abx at this time. She has had this procedure performed in the past and she has never had these symptoms before. She also reports fevers and chills over the weekend. Highest temp was 99.0. Pt denies any hematuria or blood in her stool. Pt A&Ox4, resp e/u, and skin warm and dry.

## 2014-11-26 NOTE — ED Provider Notes (Signed)
CSN: [id number]     Arrival date & time 11/26/14  1048 History   First MD Initiated Contact with Patient 11/26/14 1252     Chief Complaint  Patient presents with  . Flank Pain     (Consider location/radiation/quality/duration/timing/severity/associated sxs/prior Treatment) HPI   [name] is a 39 y.o. female who complains of left flank pain, since renal biopsy, on the left and right kidney which was done 4 days ago. He also had a renal biopsy of the right kidney done, June 6. The pain is ongoing. She denies fever, chills, nausea, vomiting, cough, shortness of breath, chest pain, fall, or abdominal pain. There are no other known modifying factors.    Past Medical History  Diagnosis Date  . Hypertension   . Cardiac arrhythmia   . SVT (supraventricular tachycardia)     "today, last week, 2 wk ago; maybe 1 month ago, etc; started w/in last 3-4 yrs"(07/20/2012)  . Fainting     "~ 1 month ago; probably related to SVT" (07/20/2012)  . Heart murmur     "small" (07/20/2012)  . Shortness of breath     "related to SVT episodes" (07/20/2012)  . Anemia     "today" (07/20/2012)  . Chest pain at rest     "related to SVT" (07/20/2012)  . Sleep apnea     "don't wear mask" (07/20/2012)  . Daily headache   . Anxiety   . Lupus (systemic lupus erythematosus)   . Renal disorder   . Chronic renal failure, stage 4 (severe)    Past Surgical History  Procedure Laterality Date  . Appendectomy  2011  . Tubal ligation  2010  . Cesarean section  2001; 2010  . Cervical biopsy  2013  . Ablation      07/21/12 Dr. Cristopher Peru    Family History  Problem Relation Age of Onset  . Cancer Mother     breast and ovarian  . BRCA 1/2 Sister    Social History  Substance Use Topics  . Smoking status: Never Smoker   . Smokeless tobacco: Never Used  . Alcohol Use: No     Comment: 07/20/2012 "might have a beer once/yr"   OB History    Gravida Para Term Preterm AB TAB SAB Ectopic Multiple Living   2 2 2   0 0 0 0 0 0 2     Review of Systems  All other systems reviewed and are negative.     Allergies  Food  Home Medications   Prior to Admission medications   Medication Sig Start Date End Date Taking? Authorizing Provider  acetaminophen (TYLENOL) 500 MG tablet Take 1,000 mg by mouth every 6 (six) hours as needed (pain).     Historical Provider, MD  alendronate (FOSAMAX) 70 MG tablet Take 70 mg by mouth once a week. Take on Wednesdays with a full glass of water on an empty stomach.    Historical Provider, MD  amLODipine (NORVASC) 10 MG tablet Take 1 tablet (10 mg total) by mouth daily. Patient taking differently: Take 10 mg by mouth 2 (two) times daily.  11/15/12   Oswald Hillock, MD  Calcium Carbonate Antacid (TUMS PO) Take 1-2 tablets by mouth daily as needed (heartburn).    Historical Provider, MD  cyclobenzaprine (FLEXERIL) 10 MG tablet Take 10 mg by mouth at bedtime as needed for muscle spasms.     Historical Provider, MD  famotidine (PEPCID) 40 MG tablet Take 1 tablet (40 mg total) by mouth  daily. 11/13/14   Etta Quill, NP  megestrol (MEGACE) 40 MG tablet Take 1 tablet (40 mg total) by mouth daily. Take 1 tablet (85m) PO BID x 7 days. Take 1 tablet (415m daily until seen by Ob/Gyn 11/18/14   JeBaron SanePA-C  metoprolol succinate (TOPROL-XL) 25 MG 24 hr tablet Take 25 mg by mouth daily.    Historical Provider, MD  mycophenolate (CELLCEPT) 500 MG tablet Take 1,000 mg by mouth 2 (two) times daily.     Historical Provider, MD  ondansetron (ZOFRAN ODT) 4 MG disintegrating tablet 81m11mDT q4 hours prn nausea/vomit 10/22/14   Robyn M Hess, PA-C  predniSONE (DELTASONE) 10 MG tablet Take 5 mg by mouth daily with breakfast.     Historical Provider, MD  simvastatin (ZOCOR) 40 MG tablet Take 40 mg by mouth daily.    Historical Provider, MD  traMADol (ULTRAM) 50 MG tablet Take 1 tablet (50 mg total) by mouth every 6 (six) hours as needed. 11/18/14   Jennifer Piepenbrink, PA-C   BP 174/77  mmHg  Pulse 101  Temp(Src) 98.1 F (36.7 C) (Oral)  Resp 20  SpO2 100%  LMP 11/17/2014 Physical Exam  Constitutional: She is oriented to person, place, and time. She appears well-developed and well-nourished.  HENT:  Head: Normocephalic and atraumatic.  Right Ear: External ear normal.  Left Ear: External ear normal.  Eyes: Conjunctivae and EOM are normal. Pupils are equal, round, and reactive to light.  Neck: Normal range of motion and phonation normal. Neck supple.  Cardiovascular: Normal rate, regular rhythm and normal heart sounds.   Pulmonary/Chest: Effort normal and breath sounds normal. She exhibits no bony tenderness.  Abdominal: Soft. There is no tenderness.  Musculoskeletal: Normal range of motion. She exhibits tenderness (Moderate diffuse left flank tenderness, without associated mass, deformity or bleeding. No tenderness of right flank.).  Neurological: She is alert and oriented to person, place, and time. No cranial nerve deficit or sensory deficit. She exhibits normal muscle tone. Coordination normal.  Skin: Skin is warm, dry and intact.  Psychiatric: She has a normal mood and affect. Her behavior is normal. Judgment and thought content normal.  Nursing note and vitals reviewed.   ED Course  Procedures (including critical care time)  Medications  oxyCODONE-acetaminophen (PERCOCET/ROXICET) 5-325 MG per tablet 1 tablet (1 tablet Oral Given 11/26/14 1627)    Patient Vitals for the past 24 hrs:  BP Temp Temp src Pulse Resp SpO2  11/26/14 1615 177/84 mmHg - - 107 16 100 %  11/26/14 1600 170/84 mmHg - - 108 18 100 %  11/26/14 1545 168/79 mmHg - - 106 18 100 %  11/26/14 1530 172/94 mmHg - - 103 21 100 %  11/26/14 1500 157/83 mmHg - - 103 19 100 %  11/26/14 1445 135/81 mmHg - - 103 21 95 %  11/26/14 1345 168/82 mmHg - - 103 23 100 %  11/26/14 1330 168/85 mmHg - - 115 17 100 %  11/26/14 1300 174/77 mmHg - - 101 20 100 %  11/26/14 1245 181/88 mmHg - - 106 - 100 %   11/26/14 1119 155/89 mmHg 98.1 F (36.7 C) Oral 109 16 100 %   16:40- CT results discussed with radiologist, Dr. GreNyoka Cowdenhe stated that she discussed the case with Dr. HasVernard Gamblesnterventional radiologist. We reviewed the CT findings, together. Patient does have some stranding of the left flank which could potentially be a source of her pain. Him and related to her biopsy 4 days  ago. She also has mild bilateral small areas of retroperitoneal bleeding, left greater than right.  4:46 PM Reevaluation with update and discussion. After initial assessment and treatment, an updated evaluation reveals She is comfortable now. Findings discussed with the patient, all questions were answered. Karey Stucki L    Labs Review Labs Reviewed  COMPREHENSIVE METABOLIC PANEL - Abnormal; Notable for the following:    Chloride 115 (*)    CO2 15 (*)    BUN 48 (*)    Creatinine, Ser 6.23 (*)    Albumin 2.9 (*)    AST 42 (*)    Alkaline Phosphatase 37 (*)    GFR calc non Af Amer 8 (*)    GFR calc Af Amer 9 (*)    All other components within normal limits  CBC - Abnormal; Notable for the following:    RBC 2.35 (*)    Hemoglobin 6.8 (*)    HCT 20.6 (*)    All other components within normal limits  URINALYSIS, ROUTINE W REFLEX MICROSCOPIC (NOT AT [facility])  POC URINE PREG, ED    Imaging Review No results found. I have personally reviewed and evaluated these images and lab results as part of my medical decision-making.   EKG Interpretation None      MDM   Final diagnoses:  Left flank pain    Flank pain post renal biopsy, with mild postoperative retroperitoneal bleeding, left greater than right, and mild blood loss anemia. She has a low hemoglobin at baseline. She is not symptomatic for the blood loss. She does have mild left flank pain. Doubt impending vascular collapse.   Nursing Notes Reviewed/ Care Coordinated Applicable Imaging Reviewed Interpretation of Laboratory Data incorporated into ED  treatment  The patient appears reasonably screened and/or stabilized for discharge and I doubt any other medical condition or other [facility] requiring further screening, evaluation, or treatment in the ED at this time prior to discharge.  Plan: Home Medications- Percocet; Home Treatments- rest, heat; return here if the recommended treatment, does not improve the symptoms; Recommended follow up- Renal F/U in 2 days  Daleen Bo, MD 11/26/14 [phone]

## 2014-11-26 NOTE — Discharge Instructions (Signed)
Flank Pain °Flank pain refers to pain that is located on the side of the body between the upper abdomen and the back. The pain may occur over a short period of time (acute) or may be long-term or reoccurring (chronic). It may be mild or severe. Flank pain can be caused by many things. °CAUSES  °Some of the more common causes of flank pain include: °· Muscle strains.   °· Muscle spasms.   °· A disease of your spine (vertebral disk disease).   °· A lung infection (pneumonia).   °· Fluid around your lungs (pulmonary edema).   °· A kidney infection.   °· Kidney stones.   °· A very painful skin rash caused by the chickenpox virus (shingles).   °· Gallbladder disease.   °HOME CARE INSTRUCTIONS  °Home care will depend on the cause of your pain. In general, °· Rest as directed by your caregiver. °· Drink enough fluids to keep your urine clear or pale yellow. °· Only take over-the-counter or prescription medicines as directed by your caregiver. Some medicines may help relieve the pain. °· Tell your caregiver about any changes in your pain. °· Follow up with your caregiver as directed. °SEEK IMMEDIATE MEDICAL CARE IF:  °· Your pain is not controlled with medicine.   °· You have new or worsening symptoms. °· Your pain increases.   °· You have abdominal pain.   °· You have shortness of breath.   °· You have persistent nausea or vomiting.   °· You have swelling in your abdomen.   °· You feel faint or pass out.   °· You have blood in your urine. °· You have a fever or persistent symptoms for more than 2-3 days. °· You have a fever and your symptoms suddenly get worse. °MAKE SURE YOU:  °· Understand these instructions. °· Will watch your condition. °· Will get help right away if you are not doing well or get worse. °Document Released: 05/07/2005 Document Revised: 12/09/2011 Document Reviewed: 10/29/2011 °Exit[employer]® Patient Information ©2015 [employer]. This information is not intended to replace advice given to you by your  health care provider. Make sure you discuss any questions you have with your health care provider. ° °

## 2014-11-28 ENCOUNTER — Ambulatory Visit (HOSPITAL_COMMUNITY): Payer: Medicaid Other

## 2014-11-29 ENCOUNTER — Emergency Department (HOSPITAL_COMMUNITY): Payer: Medicaid Other

## 2014-11-29 ENCOUNTER — Emergency Department (HOSPITAL_COMMUNITY)
Admission: EM | Admit: 2014-11-29 | Discharge: 2014-11-29 | Disposition: A | Discharge status: Home / Self Care | Payer: Medicaid Other | Attending: Emergency Medicine | Admitting: Emergency Medicine

## 2014-11-29 ENCOUNTER — Other Ambulatory Visit: Payer: Self-pay

## 2014-11-29 ENCOUNTER — Encounter (HOSPITAL_COMMUNITY): Payer: Self-pay

## 2014-11-29 DIAGNOSIS — R7989 Other specified abnormal findings of blood chemistry: Secondary | ICD-10-CM | POA: Diagnosis present

## 2014-11-29 DIAGNOSIS — I129 Hypertensive chronic kidney disease with stage 1 through stage 4 chronic kidney disease, or unspecified chronic kidney disease: Secondary | ICD-10-CM | POA: Insufficient documentation

## 2014-11-29 DIAGNOSIS — R109 Unspecified abdominal pain: Secondary | ICD-10-CM | POA: Diagnosis not present

## 2014-11-29 DIAGNOSIS — D649 Anemia, unspecified: Secondary | ICD-10-CM | POA: Diagnosis not present

## 2014-11-29 DIAGNOSIS — Z7952 Long term (current) use of systemic steroids: Secondary | ICD-10-CM | POA: Diagnosis not present

## 2014-11-29 DIAGNOSIS — R011 Cardiac murmur, unspecified: Secondary | ICD-10-CM | POA: Insufficient documentation

## 2014-11-29 DIAGNOSIS — N184 Chronic kidney disease, stage 4 (severe): Secondary | ICD-10-CM | POA: Diagnosis not present

## 2014-11-29 DIAGNOSIS — Z79899 Other long term (current) drug therapy: Secondary | ICD-10-CM | POA: Diagnosis not present

## 2014-11-29 DIAGNOSIS — Z8659 Personal history of other mental and behavioral disorders: Secondary | ICD-10-CM | POA: Insufficient documentation

## 2014-11-29 DIAGNOSIS — Z8669 Personal history of other diseases of the nervous system and sense organs: Secondary | ICD-10-CM | POA: Insufficient documentation

## 2014-11-29 DIAGNOSIS — Z8739 Personal history of other diseases of the musculoskeletal system and connective tissue: Secondary | ICD-10-CM | POA: Diagnosis not present

## 2014-11-29 DIAGNOSIS — N939 Abnormal uterine and vaginal bleeding, unspecified: Secondary | ICD-10-CM | POA: Insufficient documentation

## 2014-11-29 LAB — PREPARE RBC (CROSSMATCH)

## 2014-11-29 MED ORDER — SODIUM CHLORIDE 0.9 % IV SOLN: 250.0000 mg | Freq: Every day | INTRAVENOUS | Status: DC

## 2014-11-29 MED ORDER — SODIUM CHLORIDE 0.9 % IV SOLN: 10.0000 mL/h | Freq: Once | INTRAVENOUS | Status: DC

## 2014-11-29 MED ORDER — SODIUM CHLORIDE 0.9 % IV SOLN
10.0000 mL/h | Freq: Once | INTRAVENOUS | Status: AC
  Administered 2014-11-29: 10 mL/h via INTRAVENOUS

## 2014-11-29 MED ORDER — GI COCKTAIL ~~LOC~~
30.0000 mL | Freq: Once | ORAL | Status: AC
  Administered 2014-11-29: 30 mL via ORAL
  Filled 2014-11-29: qty 30

## 2014-11-29 MED ORDER — SODIUM CHLORIDE 0.9 % IV SOLN
250.0000 mg | Freq: Once | INTRAVENOUS | Status: AC
  Administered 2014-11-29: 250 mg via INTRAVENOUS
  Filled 2014-11-29: qty 2

## 2014-11-29 MED ORDER — LIDOCAINE HCL 1 % IJ SOLN
INTRAMUSCULAR | Status: AC
  Filled 2014-11-29: qty 20

## 2014-11-29 NOTE — Discharge Instructions (Signed)
Please report to ED tomorrow for another dose of IV solu medrol and PRBC. Report to [facility] sat for 3rd dose of Solu-medrol.

## 2014-11-29 NOTE — ED Notes (Signed)
PA made aware of patient's chest pain.

## 2014-11-29 NOTE — Discharge Planning (Signed)
NCM consulted to contact pt Nephrologist (Dr. Florene Glen) to see if third dose of steroids could be changed to oral instead of IV.  Dr. Florene Glen states that third dose of steroids can not be changed to oral, pt should receive IV as ordered. Becki Mccaskill J. Clydene Laming, [place], [place], [employer] [phone]

## 2014-11-29 NOTE — Procedures (Signed)
Successful placement of R IJ approach 24 cm dual lumen tunneled PICC line with tip at the superior caval-atrial junction.   The PICC line is ready for immediate use.  Ronny Bacon, MD Pager #: [phone]

## 2014-11-29 NOTE — Discharge Planning (Signed)
NCM consulted to assist with OP set up for blood transfusion and IV steroids for next 2 days.  NCM called Short-Stay for availability.  No availability for 9/2; but there is time on 9/3.  NCM relayed message to EDP and PA.  PA to place orders in so [facility] short stay can place pt on schedule.

## 2014-11-29 NOTE — ED Provider Notes (Signed)
CSN: [id number]     Arrival date & time 11/29/14  1042 History   First MD Initiated Contact with Patient 11/29/14 1047     Chief Complaint  Patient presents with  . Abnormal Lab     (Consider location/radiation/quality/duration/timing/severity/associated sxs/prior Treatment) HPI [name] is a 39 y.o. female with history of hypertension, SVT, chronic kidney disease, lupus, presents to emergency department with orders from her nephrologist. Patient had a kidney biopsy on 11/22/2014. Since then she has experienced some bleeding into the area and has had decreased hemoglobin. She was sent originally to short stay by a nephrologist, Dr. Florene Glen, with in order to transfuse 1 unit of packed red blood cells today (and tomorrow), as well as to administer Solu-Medrol 250 mg IV today, tomorrow, Saturday. Patient reported to short stay, however they were full. Patient was sent to emergency department. Patient reports mild discomfort to the left flank, however denies any other complaints. Patient has no other reasons for the ED visit.  Past Medical History  Diagnosis Date  . Hypertension   . Cardiac arrhythmia   . SVT (supraventricular tachycardia)     "today, last week, 2 wk ago; maybe 1 month ago, etc; started w/in last 3-4 yrs"(07/20/2012)  . Fainting     "~ 1 month ago; probably related to SVT" (07/20/2012)  . Heart murmur     "small" (07/20/2012)  . Shortness of breath     "related to SVT episodes" (07/20/2012)  . Anemia     "today" (07/20/2012)  . Chest pain at rest     "related to SVT" (07/20/2012)  . Sleep apnea     "don't wear mask" (07/20/2012)  . Daily headache   . Anxiety   . Lupus (systemic lupus erythematosus)   . Renal disorder   . Chronic renal failure, stage 4 (severe)    Past Surgical History  Procedure Laterality Date  . Appendectomy  2011  . Tubal ligation  2010  . Cesarean section  2001; 2010  . Cervical biopsy  2013  . Ablation      07/21/12 Dr. Cristopher Peru     Family History  Problem Relation Age of Onset  . Cancer Mother     breast and ovarian  . BRCA 1/2 Sister    Social History  Substance Use Topics  . Smoking status: Never Smoker   . Smokeless tobacco: Never Used  . Alcohol Use: No     Comment: 07/20/2012 "might have a beer once/yr"   OB History    Gravida Para Term Preterm AB TAB SAB Ectopic Multiple Living   2 2 2  0 0 0 0 0 0 2     Review of Systems  Constitutional: Negative for fever and chills.  Respiratory: Negative for cough, chest tightness and shortness of breath.   Cardiovascular: Negative for chest pain, palpitations and leg swelling.  Gastrointestinal: Positive for abdominal pain. Negative for nausea, vomiting and diarrhea.  Genitourinary: Positive for flank pain and vaginal bleeding. Negative for dysuria, vaginal discharge, vaginal pain and pelvic pain.  Musculoskeletal: Negative for myalgias, arthralgias, neck pain and neck stiffness.  Skin: Negative for rash.  Neurological: Negative for dizziness, weakness and headaches.  All other systems reviewed and are negative.     Allergies  Food  Home Medications   Prior to Admission medications   Medication Sig Start Date End Date Taking? Authorizing Provider  acetaminophen (TYLENOL) 500 MG tablet Take 1,000 mg by mouth every 6 (six) hours as needed (pain).  Historical Provider, MD  alendronate (FOSAMAX) 70 MG tablet Take 70 mg by mouth once a week. Take on Wednesdays with a full glass of water on an empty stomach.    Historical Provider, MD  amLODipine (NORVASC) 10 MG tablet Take 1 tablet (10 mg total) by mouth daily. Patient taking differently: Take 10 mg by mouth 2 (two) times daily.  11/15/12   Oswald Hillock, MD  Calcium Carbonate Antacid (TUMS PO) Take 1-2 tablets by mouth daily as needed (heartburn).    Historical Provider, MD  cyclobenzaprine (FLEXERIL) 10 MG tablet Take 10 mg by mouth at bedtime as needed for muscle spasms.     Historical Provider, MD   famotidine (PEPCID) 40 MG tablet Take 1 tablet (40 mg total) by mouth daily. 11/13/14   Etta Quill, NP  megestrol (MEGACE) 40 MG tablet Take 1 tablet (40 mg total) by mouth daily. Take 1 tablet (67m) PO BID x 7 days. Take 1 tablet (489m daily until seen by Ob/Gyn 11/18/14   JeBaron SanePA-C  metoprolol succinate (TOPROL-XL) 25 MG 24 hr tablet Take 25 mg by mouth daily.    Historical Provider, MD  mycophenolate (CELLCEPT) 500 MG tablet Take 1,500 mg by mouth 2 (two) times daily.     Historical Provider, MD  ondansetron (ZOFRAN ODT) 4 MG disintegrating tablet 8m31mDT q4 hours prn nausea/vomit 10/22/14   Robyn M Hess, PA-C  oxyCODONE-acetaminophen (PERCOCET) 5-325 MG per tablet Take 1 tablet by mouth every 4 (four) hours as needed for severe pain. 11/26/14   EllDaleen BoD  predniSONE (DELTASONE) 20 MG tablet Take 60 mg by mouth daily with breakfast.    Historical Provider, MD  simvastatin (ZOCOR) 40 MG tablet Take 40 mg by mouth daily.    Historical Provider, MD   BP 179/115 mmHg  Pulse 80  Temp(Src) 98.1 F (36.7 C) (Oral)  Resp 20  Ht 5' 3"  (1.6 m)  Wt 237 lb (107.502 kg)  BMI 41.99 kg/m2  SpO2 100%  LMP 11/17/2014 (Approximate) Physical Exam  Constitutional: She appears well-developed and well-nourished. No distress.  HENT:  Head: Normocephalic.  Eyes: Conjunctivae are normal.  Neck: Neck supple.  Cardiovascular: Normal rate, regular rhythm and normal heart sounds.   Pulmonary/Chest: Effort normal and breath sounds normal. No respiratory distress. She has no wheezes. She has no rales.  Abdominal: Soft. Bowel sounds are normal. She exhibits no distension. There is no tenderness. There is no rebound.  Musculoskeletal: She exhibits no edema.  Neurological: She is alert.  Skin: Skin is warm and dry.  Psychiatric: She has a normal mood and affect. Her behavior is normal.  Nursing note and vitals reviewed.   ED Course  Procedures (including critical care time) Labs  Review Labs Reviewed  TYPE AND SCREEN  PREPARE RBC (CROSSMATCH)    Imaging Review No results found. I have personally reviewed and evaluated these images and lab results as part of my medical decision-making.   EKG Interpretation   Date/Time:  Thursday November 29 2014 15:59:01 EDT Ventricular Rate:  98 PR Interval:  135 QRS Duration: 74 QT Interval:  324 QTC Calculation: 414 R Axis:   34 Text Interpretation:  Sinus rhythm Probable left atrial enlargement  Borderline T abnormalities, lateral leads No significant change since last  tracing Confirmed by PLUMaryan RuedD, WHILoree [id number] 11/29/2014 8:37:43 PM      MDM   Final diagnoses:  Anemia   Pt here with orders from Dr. PowBjorn Pippin transfuse  1unit of PRBC and administer 27m IV solumedrol. Will give tx. Last hgb was yesterday, 6.8.   1:11 PM Difficulty obtaining IV by RN and by IV team. Nurse discussed this with Dr. SJoelyn Oms who recommended fluoroscopy CV line. N discussed with IR, advised to place order.  7:36 PM Pt receiving transfusion. Pt did complain of some CP earlier. ECG obtained with was with no acute findings. Gave pt tylenol and GI cocktail. Do not think this is ACS. VS normal with no hypoxia or tachycardia, doubt PE. Will continue to monitor through transfusion.   Pt was seen by sEducation officer, museumand we have had multiple phone calls to nephrology. Pt will come back to ED for transfusion of 1 more unit of PRBC over 3 hrs, and dose of solumedrol 250 mg IV tomorrow. [facility]   Filed Vitals:   11/29/14 1745 11/29/14 1938 11/29/14 1957 11/29/14 2000  BP: 176/78 163/83 168/82 166/84  Pulse:  97 95 91  Temp:  98.3 F (36.8 C) 98.5 F (36.9 C)   TempSrc:  Oral Oral   Resp: 26 13 14 10   Height:      Weight:      SpO2:  100% 100% 100%     TJeannett Senior PA-C 11/29/14 2038  JDorie Rank MD 12/01/14 0000

## 2014-11-29 NOTE — ED Notes (Signed)
Took over care of the patient at this time  

## 2014-11-29 NOTE — Progress Notes (Signed)
Patient ID: [name], female   DOB: 02-16-1976, 39 y.o.   MRN: [id number]     Referring Physician(s): Dr. Florene Glen  Chief Complaint:  Flank pain s/p renal biopsy Poor IV Access Lupus  HPI:  [name] is a pleasant 39 yo female with lupus and worsening kidney disease who presented to the ED today c/o flank pain. She underwent a renal biopsy on 8/25  CT showed Small right perinephric hemorrhage with gas likely related to recent renal biopsy. Probable subacute chronic left retroperitoneal low-density fluid/gas collection likely related to prior biopsy.  We are asked by Nephrology to place a tunneled IJ cath so she can receive steroids to treat her lupus due to her being a difficult peripheral stick, poor IV access, and the need to salvage arm veins for future dialysis fistula/shunt.   Allergies: Food  Medications: Prior to Admission medications   Medication Sig Start Date End Date Taking? Authorizing Provider  acetaminophen (TYLENOL) 500 MG tablet Take 1,000 mg by mouth every 6 (six) hours as needed (pain).    Yes Historical Provider, MD  alendronate (FOSAMAX) 70 MG tablet Take 70 mg by mouth once a week. Take on Wednesdays with a full glass of water on an empty stomach.   Yes Historical Provider, MD  amLODipine (NORVASC) 10 MG tablet Take 1 tablet (10 mg total) by mouth daily. Patient taking differently: Take 10 mg by mouth 2 (two) times daily.  11/15/12  Yes Oswald Hillock, MD  Calcium Carbonate Antacid (TUMS PO) Take 1-2 tablets by mouth daily as needed (heartburn).   Yes Historical Provider, MD  cyclobenzaprine (FLEXERIL) 10 MG tablet Take 10 mg by mouth at bedtime as needed for muscle spasms.    Yes Historical Provider, MD  famotidine (PEPCID) 40 MG tablet Take 1 tablet (40 mg total) by mouth daily. 11/13/14  Yes Etta Quill, NP  megestrol (MEGACE) 40 MG tablet Take 1 tablet (40 mg total) by mouth daily. Take 1 tablet (40mg ) PO BID x 7 days. Take 1 tablet (40mg ) daily until seen  by Ob/Gyn 11/18/14  Yes Anderson Malta Piepenbrink, PA-C  metoprolol succinate (TOPROL-XL) 25 MG 24 hr tablet Take 25 mg by mouth daily.   Yes Historical Provider, MD  mycophenolate (CELLCEPT) 500 MG tablet Take 1,500 mg by mouth 2 (two) times daily.    Yes Historical Provider, MD  ondansetron (ZOFRAN ODT) 4 MG disintegrating tablet 4mg  ODT q4 hours prn nausea/vomit 10/22/14  Yes Robyn M Hess, PA-C  oxyCODONE-acetaminophen (PERCOCET) 5-325 MG per tablet Take 1 tablet by mouth every 4 (four) hours as needed for severe pain. 11/26/14  Yes Daleen Bo, MD  predniSONE (DELTASONE) 20 MG tablet Take 20 mg by mouth 3 (three) times daily.    Yes Historical Provider, MD  simvastatin (ZOCOR) 40 MG tablet Take 40 mg by mouth daily.   Yes Historical Provider, MD     Vital Signs: BP 179/115 mmHg  Pulse 80  Temp(Src) 98.1 F (36.7 C) (Oral)  Resp 20  Ht 5\' 3"  (1.6 m)  Wt 237 lb (107.502 kg)  BMI 41.99 kg/m2  SpO2 100%  LMP 11/17/2014 (Approximate)  Physical Exam   Awake and Alert NAD Lungs Clear Heart RRR Extremities with no superficial veins seen at all.  Imaging: Ct Abdomen Pelvis Wo Contrast  11/26/2014   CLINICAL DATA:  Left flank pain. History of right renal biopsy 11/22/2014 and left renal biopsy 09/03/2014. Pathology result is not available at this time. History of stage 3 chronic kidney  disease.  EXAM: CT ABDOMEN AND PELVIS WITHOUT CONTRAST  TECHNIQUE: Multidetector CT imaging of the abdomen and pelvis was performed following the standard protocol without IV contrast.  COMPARISON:  10/21/2014 abdominal ultrasound, CT abdomen/ pelvis 09/08/2009.  FINDINGS: Lower chest: Mild cardiomegaly. Minimal subpleural dependent scarring or atelectasis. Lung bases are clear. Small pericardial effusion.  Hepatobiliary: Mild image degradation due to noise from patient's body habitus. Unenhanced liver and gallbladder appear normal.  Pancreas: Normal  Spleen: Normal  Adrenals/Urinary Tract: Adrenal glands are  normal. Moderate perinephric stranding/ fluid bilaterally. There is a small posteroinferior right renal perinephric hematoma with a few foci of internal gas likely related to recent procedure. This hematoma measures 3.8 x 1.5 cm image 42. Internal high-density is compatible with blood product. No hydroureteronephrosis. No radiopaque renal, ureteral, or bladder calculus. A small amount of retroperitoneal fluid tracks inferiorly to the pelvis. There is also an ovoid fluid density left retroperitoneal 4.6 x 1.5 cm collection with internal gas likely related to remote left renal biopsy 09/03/2014. Left posterior flank subcutaneous stranding, some of which demonstrate high density which could indicate trace blood product, is identified without subjacent rib fracture seen.  Stomach/Bowel: Surgical sutures likely indicate previous appendectomy at the base of the cecum. No bowel wall thickening or focal segmental dilatation is identified. Stomach is normal.  Vascular/Lymphatic: Prominent retroperitoneal nodes are identified, largest 1.2 cm short axis diameter image 42. No aortic aneurysm.  Other: Mild anasarca noted. Focal stranding extending to the skin surface within the right anterior abdominal wall on image 36 likely indicates location of previous surgery.  No free air.  Uterus and ovaries appear normal.  Musculoskeletal: No acute osseous abnormality.  IMPRESSION: Small right perinephric hemorrhage with gas likely related to recent renal biopsy.  Probable subacute chronic left retroperitoneal low-density fluid/gas collection likely related to prior biopsy.  Overlying the left posterior flank is subcutaneous stranding with internal relative high density could indicate recent trauma if the patient has fallen. No subjacent rib fracture is identified but cartilaginous fracture involving the ribs may be radiographically occult.  These results were called by telephone at the time of interpretation on 11/26/2014 at 4:39 pm to  Dr. Daleen Bo , who verbally acknowledged these results.   Electronically Signed   By: Conchita Paris M.D.   On: 11/26/2014 16:42    Labs:  CBC:  Recent Labs  11/13/14 1010 11/13/14 1051 11/18/14 1043 11/22/14 1316 11/26/14 1146  WBC 4.2  --  4.3 4.9 6.8  HGB 9.1* 9.9* 8.9* 7.6* 6.8*  HCT 27.8* 29.0* 27.4* 23.0* 20.6*  PLT 269  --  321 286 256    COAGS:  Recent Labs  09/03/14 0830 11/22/14 1316  INR 0.88 0.96  APTT 26 27    BMP:  Recent Labs  08/29/14 1127 10/21/14 2003 11/13/14 1051 11/18/14 1043 11/26/14 1146  NA 140 138 138 139 140  K 3.1* 3.0* 4.4 3.8 4.2  CL 105 107 112* 111 115*  CO2 26 20*  --  15* 15*  GLUCOSE 90 92 85 97 83  BUN 15 16 65* 54* 48*  CALCIUM 8.9 8.9  --  9.1 8.9  CREATININE 1.66* 4.54* 5.80* 6.08* 6.23*  GFRNONAA 38* 11*  --  8* 8*  GFRAA 44* 13*  --  9* 9*    LIVER FUNCTION TESTS:  Recent Labs  01/07/14 0803 10/21/14 2003 11/26/14 1146  BILITOT <0.2* 0.6 0.4  AST 35 42* 42*  ALT 15 13* 21  ALKPHOS  40 52 37*  PROT 6.7 7.1 6.6  ALBUMIN 2.4* 2.8* 2.9*    Assessment and Plan:  Lupus  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  Need for central venous access to receive IV steroid therapy  Will proceed with placement of tunneled cath today by Dr. Pascal Lux.  Risks and Benefits discussed with the patient including, but not limited to bleeding, infection, pneumothorax, or fibrin sheath development and need for additional procedures.  All of the patient's questions were answered, patient is agreeable to proceed. Consent signed and in chart.  Signed: Murrell Redden PA-C 11/29/2014, 2:07 PM   I spent a total of 15 Minutes at the the patient's bedside AND on the patient's hospital floor or unit, greater than 50% of which was counseling/coordinating care for placement of tunneled cath.

## 2014-11-29 NOTE — ED Notes (Signed)
Patient returned from IR

## 2014-11-29 NOTE — ED Notes (Signed)
Pt sent here by nephrologist because her labs had gotten worse. She reports he wants her to have IV steroids as well as a blood transfusion.

## 2014-11-30 ENCOUNTER — Emergency Department (HOSPITAL_COMMUNITY)
Admission: EM | Admit: 2014-11-30 | Discharge: 2014-11-30 | Disposition: A | Discharge status: Home / Self Care | Payer: Medicaid Other | Attending: Emergency Medicine | Admitting: Emergency Medicine

## 2014-11-30 DIAGNOSIS — F419 Anxiety disorder, unspecified: Secondary | ICD-10-CM | POA: Insufficient documentation

## 2014-11-30 DIAGNOSIS — Z7952 Long term (current) use of systemic steroids: Secondary | ICD-10-CM | POA: Insufficient documentation

## 2014-11-30 DIAGNOSIS — Z79899 Other long term (current) drug therapy: Secondary | ICD-10-CM | POA: Insufficient documentation

## 2014-11-30 DIAGNOSIS — R011 Cardiac murmur, unspecified: Secondary | ICD-10-CM | POA: Insufficient documentation

## 2014-11-30 DIAGNOSIS — I129 Hypertensive chronic kidney disease with stage 1 through stage 4 chronic kidney disease, or unspecified chronic kidney disease: Secondary | ICD-10-CM | POA: Diagnosis not present

## 2014-11-30 DIAGNOSIS — N184 Chronic kidney disease, stage 4 (severe): Secondary | ICD-10-CM | POA: Insufficient documentation

## 2014-11-30 DIAGNOSIS — M3214 Glomerular disease in systemic lupus erythematosus: Secondary | ICD-10-CM | POA: Diagnosis not present

## 2014-11-30 DIAGNOSIS — D649 Anemia, unspecified: Secondary | ICD-10-CM | POA: Diagnosis not present

## 2014-11-30 LAB — PREPARE RBC (CROSSMATCH)

## 2014-11-30 LAB — TYPE AND SCREEN
ABO/RH(D): A POS
Antibody Screen: NEGATIVE
UNIT DIVISION: 0
UNIT DIVISION: 0

## 2014-11-30 MED ORDER — SODIUM CHLORIDE 0.9 % IV SOLN
250.0000 mg | Freq: Once | INTRAVENOUS | Status: AC
  Administered 2014-11-30: 250 mg via INTRAVENOUS
  Filled 2014-11-30: qty 2

## 2014-11-30 MED ORDER — SODIUM CHLORIDE 0.9 % IV SOLN
Freq: Once | INTRAVENOUS | Status: AC
  Administered 2014-11-30: 10:00:00 via INTRAVENOUS

## 2014-11-30 NOTE — ED Provider Notes (Signed)
CSN: [id number]     Arrival date & time 11/30/14  0913 History   First MD Initiated Contact with Patient 11/30/14 0930     Chief Complaint  Patient presents with  . Anemia     (Consider location/radiation/quality/duration/timing/severity/associated sxs/prior Treatment) Patient is a 39 y.o. female presenting with anemia. The history is provided by the patient.  Anemia Pertinent negatives include no shortness of breath.  Patient w hx lupus, ?lupus nephritis, presents for dose of iv solumedrol and transfusion 1 unit prbc. Pt states had transfusion yesterday and states is feeling improved today. Pt denies any new symptoms or problems. No sob, no faintness or dizziness. Denies any ongoing blood loss, no rectal bleeding. Stats does have heavy periods due to fibroids.  No fever or chills. Pt states otherwise feels well today.    Past Medical History  Diagnosis Date  . Hypertension   . Cardiac arrhythmia   . SVT (supraventricular tachycardia)     "today, last week, 2 wk ago; maybe 1 month ago, etc; started w/in last 3-4 yrs"(07/20/2012)  . Fainting     "~ 1 month ago; probably related to SVT" (07/20/2012)  . Heart murmur     "small" (07/20/2012)  . Shortness of breath     "related to SVT episodes" (07/20/2012)  . Anemia     "today" (07/20/2012)  . Chest pain at rest     "related to SVT" (07/20/2012)  . Sleep apnea     "don't wear mask" (07/20/2012)  . Daily headache   . Anxiety   . Lupus (systemic lupus erythematosus)   . Renal disorder   . Chronic renal failure, stage 4 (severe)    Past Surgical History  Procedure Laterality Date  . Appendectomy  2011  . Tubal ligation  2010  . Cesarean section  2001; 2010  . Cervical biopsy  2013  . Ablation      07/21/12 Dr. Cristopher Peru    Family History  Problem Relation Age of Onset  . Cancer Mother     breast and ovarian  . BRCA 1/2 Sister    Social History  Substance Use Topics  . Smoking status: Never Smoker   . Smokeless tobacco:  Never Used  . Alcohol Use: No     Comment: 07/20/2012 "might have a beer once/yr"   OB History    Gravida Para Term Preterm AB TAB SAB Ectopic Multiple Living   2 2 2  0 0 0 0 0 0 2     Review of Systems  Constitutional: Negative for fever and chills.  Respiratory: Negative for shortness of breath.   Gastrointestinal: Negative for nausea, vomiting and blood in stool.  Skin: Negative for rash.      Allergies  Food  Home Medications   Prior to Admission medications   Medication Sig Start Date End Date Taking? Authorizing Provider  acetaminophen (TYLENOL) 500 MG tablet Take 1,000 mg by mouth every 6 (six) hours as needed (pain).     Historical Provider, MD  alendronate (FOSAMAX) 70 MG tablet Take 70 mg by mouth once a week. Take on Wednesdays with a full glass of water on an empty stomach.    Historical Provider, MD  amLODipine (NORVASC) 10 MG tablet Take 1 tablet (10 mg total) by mouth daily. Patient taking differently: Take 10 mg by mouth 2 (two) times daily.  11/15/12   Oswald Hillock, MD  Calcium Carbonate Antacid (TUMS PO) Take 1-2 tablets by mouth daily as needed (heartburn).  Historical Provider, MD  cyclobenzaprine (FLEXERIL) 10 MG tablet Take 10 mg by mouth at bedtime as needed for muscle spasms.     Historical Provider, MD  famotidine (PEPCID) 40 MG tablet Take 1 tablet (40 mg total) by mouth daily. 11/13/14   Etta Quill, NP  megestrol (MEGACE) 40 MG tablet Take 1 tablet (40 mg total) by mouth daily. Take 1 tablet (45m) PO BID x 7 days. Take 1 tablet (4221m daily until seen by Ob/Gyn 11/18/14   JeBaron SanePA-C  metoprolol succinate (TOPROL-XL) 25 MG 24 hr tablet Take 25 mg by mouth daily.    Historical Provider, MD  mycophenolate (CELLCEPT) 500 MG tablet Take 1,500 mg by mouth 2 (two) times daily.     Historical Provider, MD  ondansetron (ZOFRAN ODT) 4 MG disintegrating tablet 21m37mDT q4 hours prn nausea/vomit 10/22/14   Robyn M Hess, PA-C  oxyCODONE-acetaminophen  (PERCOCET) 5-325 MG per tablet Take 1 tablet by mouth every 4 (four) hours as needed for severe pain. 11/26/14   EllDaleen BoD  predniSONE (DELTASONE) 20 MG tablet Take 20 mg by mouth 3 (three) times daily.     Historical Provider, MD  simvastatin (ZOCOR) 40 MG tablet Take 40 mg by mouth daily.    Historical Provider, MD   BP 154/86 mmHg  Pulse 95  Temp(Src) 97.8 F (36.6 C) (Oral)  SpO2 96%  LMP 11/17/2014 (Approximate) Physical Exam  Constitutional: She appears well-developed and well-nourished. No distress.  HENT:  Mouth/Throat: Oropharynx is clear and moist.  Eyes: Conjunctivae are normal. No scleral icterus.  Neck: Neck supple. No tracheal deviation present.  Cardiovascular: Normal rate.   Pulmonary/Chest: Effort normal. No respiratory distress.  picc line chest without sign of infection  Abdominal: Soft. Normal appearance. She exhibits no distension. There is no tenderness.  Musculoskeletal: She exhibits no edema.  Neurological: She is alert.  Skin: Skin is warm and dry. No rash noted. She is not diaphoretic.  Psychiatric: She has a normal mood and affect.  Nursing note and vitals reviewed.   ED Course  Procedures (including critical care time) Labs Review     MDM   Pt has picc line.  Labs from yesterday reviewed, as well as plan to return to ED today for solumedrol 250 mg iv, and transfusion 1 unit prbc.  Reviewed nursing notes and prior charts for additional history.   Recheck pt, asymptomatic, no c/o. States feels fine. No new symptoms or transfusion rxn.  Pt currently appears stable for d/c.      KevLajean SaverD 11/30/14 1323

## 2014-11-30 NOTE — Discharge Instructions (Signed)
It was our pleasure to provide your ER care today - we hope that you feel better.  Follow up as instructed by your doctor/kidney doctor.  Return to ER if worse, new symptoms, fevers, trouble breathing, medical emergency, other concern.

## 2014-11-30 NOTE — ED Notes (Signed)
Patient with a history of anemia.  Returns to ED for blood transfusion and prednisone dose.

## 2014-11-30 NOTE — ED Notes (Signed)
Patient with a history of lupus presents for a second round of prednisone and a blood transfusion. Patient with a history of SVT and is wearing a heart monitor.

## 2014-12-01 ENCOUNTER — Ambulatory Visit (HOSPITAL_COMMUNITY)
Admit: 2014-12-01 | Discharge: 2014-12-01 | Disposition: A | Discharge status: Home / Self Care | Payer: Medicaid Other | Source: Ambulatory Visit | Attending: Nephrology | Admitting: Nephrology

## 2014-12-01 ENCOUNTER — Other Ambulatory Visit (HOSPITAL_COMMUNITY): Payer: Self-pay | Admitting: Nephrology

## 2014-12-01 DIAGNOSIS — M3214 Glomerular disease in systemic lupus erythematosus: Secondary | ICD-10-CM | POA: Insufficient documentation

## 2014-12-01 LAB — TYPE AND SCREEN
ABO/RH(D): A POS
Antibody Screen: NEGATIVE
UNIT DIVISION: 0

## 2014-12-01 MED ORDER — SODIUM CHLORIDE 0.9 % IV SOLN
250.0000 mg | Freq: Once | INTRAVENOUS | Status: DC
  Filled 2014-12-01: qty 2

## 2014-12-01 MED ORDER — HEPARIN SOD (PORK) LOCK FLUSH 100 UNIT/ML IV SOLN
250.0000 [IU] | INTRAVENOUS | Status: AC | PRN
  Administered 2014-12-01: 250 [IU]

## 2014-12-01 MED ORDER — METHYLPREDNISOLONE SODIUM SUCC 125 MG IJ SOLR
250.0000 mg | Freq: Once | INTRAMUSCULAR | Status: DC
  Administered 2014-12-01: 250 mg via INTRAVENOUS
  Filled 2014-12-01: qty 4

## 2014-12-01 MED ORDER — SODIUM CHLORIDE 0.9 % IV SOLN
Freq: Once | INTRAVENOUS | Status: AC
  Administered 2014-12-01: 10:00:00 via INTRAVENOUS

## 2014-12-01 MED ORDER — SODIUM CHLORIDE 0.9 % IJ SOLN
10.0000 mL | INTRAMUSCULAR | Status: DC | PRN
  Administered 2014-12-01: 20 mL
  Filled 2014-12-01: qty 40

## 2014-12-01 NOTE — Progress Notes (Signed)
Ms Mcdiarmid in to 1601 for out-pt IV Solumedrol. Diangelo Radel, [employer]

## 2014-12-01 NOTE — Progress Notes (Signed)
Discharged to home & self care. No changes in assessment. Eavan Gonterman, [employer]

## 2014-12-03 ENCOUNTER — Emergency Department (HOSPITAL_COMMUNITY): Payer: Medicaid Other

## 2014-12-03 ENCOUNTER — Encounter (HOSPITAL_COMMUNITY): Payer: Self-pay | Admitting: Emergency Medicine

## 2014-12-03 ENCOUNTER — Inpatient Hospital Stay (HOSPITAL_COMMUNITY)
Admission: EM | Admit: 2014-12-03 | Discharge: 2015-01-05 | DRG: 981 | Disposition: A | Discharge status: Rehabilitation Facility | Payer: Medicaid Other | Attending: Internal Medicine | Admitting: Internal Medicine

## 2014-12-03 DIAGNOSIS — N3000 Acute cystitis without hematuria: Secondary | ICD-10-CM | POA: Diagnosis not present

## 2014-12-03 DIAGNOSIS — M3214 Glomerular disease in systemic lupus erythematosus: Secondary | ICD-10-CM | POA: Diagnosis present

## 2014-12-03 DIAGNOSIS — I6789 Other cerebrovascular disease: Secondary | ICD-10-CM | POA: Diagnosis not present

## 2014-12-03 DIAGNOSIS — T82898A Other specified complication of vascular prosthetic devices, implants and grafts, initial encounter: Secondary | ICD-10-CM | POA: Diagnosis not present

## 2014-12-03 DIAGNOSIS — I69354 Hemiplegia and hemiparesis following cerebral infarction affecting left non-dominant side: Secondary | ICD-10-CM | POA: Diagnosis not present

## 2014-12-03 DIAGNOSIS — R4781 Slurred speech: Secondary | ICD-10-CM | POA: Diagnosis not present

## 2014-12-03 DIAGNOSIS — I058 Other rheumatic mitral valve diseases: Secondary | ICD-10-CM | POA: Diagnosis present

## 2014-12-03 DIAGNOSIS — F4321 Adjustment disorder with depressed mood: Secondary | ICD-10-CM | POA: Diagnosis not present

## 2014-12-03 DIAGNOSIS — R278 Other lack of coordination: Secondary | ICD-10-CM | POA: Diagnosis not present

## 2014-12-03 DIAGNOSIS — R300 Dysuria: Secondary | ICD-10-CM | POA: Diagnosis not present

## 2014-12-03 DIAGNOSIS — M7989 Other specified soft tissue disorders: Secondary | ICD-10-CM | POA: Diagnosis not present

## 2014-12-03 DIAGNOSIS — I2111 ST elevation (STEMI) myocardial infarction involving right coronary artery: Secondary | ICD-10-CM | POA: Diagnosis not present

## 2014-12-03 DIAGNOSIS — I129 Hypertensive chronic kidney disease with stage 1 through stage 4 chronic kidney disease, or unspecified chronic kidney disease: Secondary | ICD-10-CM | POA: Diagnosis not present

## 2014-12-03 DIAGNOSIS — G8194 Hemiplegia, unspecified affecting left nondominant side: Secondary | ICD-10-CM | POA: Diagnosis not present

## 2014-12-03 DIAGNOSIS — B961 Klebsiella pneumoniae [K. pneumoniae] as the cause of diseases classified elsewhere: Secondary | ICD-10-CM | POA: Diagnosis present

## 2014-12-03 DIAGNOSIS — N179 Acute kidney failure, unspecified: Principal | ICD-10-CM

## 2014-12-03 DIAGNOSIS — R112 Nausea with vomiting, unspecified: Secondary | ICD-10-CM | POA: Diagnosis not present

## 2014-12-03 DIAGNOSIS — G8918 Other acute postprocedural pain: Secondary | ICD-10-CM | POA: Diagnosis not present

## 2014-12-03 DIAGNOSIS — I63511 Cerebral infarction due to unspecified occlusion or stenosis of right middle cerebral artery: Secondary | ICD-10-CM | POA: Diagnosis not present

## 2014-12-03 DIAGNOSIS — I63011 Cerebral infarction due to thrombosis of right vertebral artery: Secondary | ICD-10-CM | POA: Diagnosis not present

## 2014-12-03 DIAGNOSIS — K589 Irritable bowel syndrome without diarrhea: Secondary | ICD-10-CM | POA: Diagnosis present

## 2014-12-03 DIAGNOSIS — I059 Rheumatic mitral valve disease, unspecified: Secondary | ICD-10-CM | POA: Diagnosis present

## 2014-12-03 DIAGNOSIS — I120 Hypertensive chronic kidney disease with stage 5 chronic kidney disease or end stage renal disease: Secondary | ICD-10-CM | POA: Diagnosis not present

## 2014-12-03 DIAGNOSIS — M797 Fibromyalgia: Secondary | ICD-10-CM | POA: Diagnosis present

## 2014-12-03 DIAGNOSIS — I10 Essential (primary) hypertension: Secondary | ICD-10-CM | POA: Diagnosis present

## 2014-12-03 DIAGNOSIS — M3211 Endocarditis in systemic lupus erythematosus: Secondary | ICD-10-CM | POA: Diagnosis present

## 2014-12-03 DIAGNOSIS — I63411 Cerebral infarction due to embolism of right middle cerebral artery: Secondary | ICD-10-CM | POA: Diagnosis not present

## 2014-12-03 DIAGNOSIS — D62 Acute posthemorrhagic anemia: Secondary | ICD-10-CM | POA: Diagnosis not present

## 2014-12-03 DIAGNOSIS — R29810 Facial weakness: Secondary | ICD-10-CM | POA: Diagnosis not present

## 2014-12-03 DIAGNOSIS — D61818 Other pancytopenia: Secondary | ICD-10-CM | POA: Diagnosis present

## 2014-12-03 DIAGNOSIS — L8990 Pressure ulcer of unspecified site, unspecified stage: Secondary | ICD-10-CM | POA: Diagnosis not present

## 2014-12-03 DIAGNOSIS — E875 Hyperkalemia: Secondary | ICD-10-CM | POA: Diagnosis present

## 2014-12-03 DIAGNOSIS — Z6841 Body Mass Index (BMI) 40.0 and over, adult: Secondary | ICD-10-CM | POA: Diagnosis not present

## 2014-12-03 DIAGNOSIS — G451 Carotid artery syndrome (hemispheric): Secondary | ICD-10-CM | POA: Diagnosis not present

## 2014-12-03 DIAGNOSIS — I639 Cerebral infarction, unspecified: Secondary | ICD-10-CM | POA: Diagnosis not present

## 2014-12-03 DIAGNOSIS — N186 End stage renal disease: Secondary | ICD-10-CM

## 2014-12-03 DIAGNOSIS — Y838 Other surgical procedures as the cause of abnormal reaction of the patient, or of later complication, without mention of misadventure at the time of the procedure: Secondary | ICD-10-CM | POA: Diagnosis not present

## 2014-12-03 DIAGNOSIS — R531 Weakness: Secondary | ICD-10-CM

## 2014-12-03 DIAGNOSIS — E662 Morbid (severe) obesity with alveolar hypoventilation: Secondary | ICD-10-CM | POA: Diagnosis present

## 2014-12-03 DIAGNOSIS — N185 Chronic kidney disease, stage 5: Secondary | ICD-10-CM | POA: Diagnosis not present

## 2014-12-03 DIAGNOSIS — Z9889 Other specified postprocedural states: Secondary | ICD-10-CM | POA: Diagnosis not present

## 2014-12-03 DIAGNOSIS — N99820 Postprocedural hemorrhage and hematoma of a genitourinary system organ or structure following a genitourinary system procedure: Secondary | ICD-10-CM | POA: Diagnosis not present

## 2014-12-03 DIAGNOSIS — E785 Hyperlipidemia, unspecified: Secondary | ICD-10-CM

## 2014-12-03 DIAGNOSIS — Z7952 Long term (current) use of systemic steroids: Secondary | ICD-10-CM | POA: Diagnosis not present

## 2014-12-03 DIAGNOSIS — R509 Fever, unspecified: Secondary | ICD-10-CM | POA: Diagnosis not present

## 2014-12-03 DIAGNOSIS — R319 Hematuria, unspecified: Secondary | ICD-10-CM | POA: Diagnosis present

## 2014-12-03 DIAGNOSIS — L89322 Pressure ulcer of left buttock, stage 2: Secondary | ICD-10-CM | POA: Diagnosis not present

## 2014-12-03 DIAGNOSIS — I471 Supraventricular tachycardia: Secondary | ICD-10-CM | POA: Diagnosis not present

## 2014-12-03 DIAGNOSIS — L930 Discoid lupus erythematosus: Secondary | ICD-10-CM | POA: Diagnosis not present

## 2014-12-03 DIAGNOSIS — D691 Qualitative platelet defects: Secondary | ICD-10-CM | POA: Diagnosis not present

## 2014-12-03 DIAGNOSIS — D631 Anemia in chronic kidney disease: Secondary | ICD-10-CM | POA: Diagnosis present

## 2014-12-03 DIAGNOSIS — N189 Chronic kidney disease, unspecified: Secondary | ICD-10-CM | POA: Diagnosis not present

## 2014-12-03 DIAGNOSIS — K683 Retroperitoneal hematoma: Secondary | ICD-10-CM | POA: Diagnosis present

## 2014-12-03 DIAGNOSIS — D638 Anemia in other chronic diseases classified elsewhere: Secondary | ICD-10-CM

## 2014-12-03 DIAGNOSIS — N390 Urinary tract infection, site not specified: Secondary | ICD-10-CM

## 2014-12-03 DIAGNOSIS — Z419 Encounter for procedure for purposes other than remedying health state, unspecified: Secondary | ICD-10-CM

## 2014-12-03 DIAGNOSIS — D696 Thrombocytopenia, unspecified: Secondary | ICD-10-CM | POA: Diagnosis not present

## 2014-12-03 DIAGNOSIS — M79669 Pain in unspecified lower leg: Secondary | ICD-10-CM | POA: Diagnosis not present

## 2014-12-03 DIAGNOSIS — Z992 Dependence on renal dialysis: Secondary | ICD-10-CM | POA: Diagnosis not present

## 2014-12-03 DIAGNOSIS — B9689 Other specified bacterial agents as the cause of diseases classified elsewhere: Secondary | ICD-10-CM

## 2014-12-03 DIAGNOSIS — B965 Pseudomonas (aeruginosa) (mallei) (pseudomallei) as the cause of diseases classified elsewhere: Secondary | ICD-10-CM | POA: Diagnosis not present

## 2014-12-03 DIAGNOSIS — I63311 Cerebral infarction due to thrombosis of right middle cerebral artery: Secondary | ICD-10-CM | POA: Diagnosis not present

## 2014-12-03 DIAGNOSIS — M549 Dorsalgia, unspecified: Secondary | ICD-10-CM

## 2014-12-03 DIAGNOSIS — D619 Aplastic anemia, unspecified: Secondary | ICD-10-CM | POA: Insufficient documentation

## 2014-12-03 DIAGNOSIS — I69392 Facial weakness following cerebral infarction: Secondary | ICD-10-CM | POA: Diagnosis not present

## 2014-12-03 DIAGNOSIS — K661 Hemoperitoneum: Secondary | ICD-10-CM | POA: Diagnosis present

## 2014-12-03 DIAGNOSIS — L89152 Pressure ulcer of sacral region, stage 2: Secondary | ICD-10-CM | POA: Diagnosis not present

## 2014-12-03 DIAGNOSIS — F419 Anxiety disorder, unspecified: Secondary | ICD-10-CM | POA: Diagnosis present

## 2014-12-03 DIAGNOSIS — D72819 Decreased white blood cell count, unspecified: Secondary | ICD-10-CM | POA: Diagnosis not present

## 2014-12-03 DIAGNOSIS — E872 Acidosis: Secondary | ICD-10-CM | POA: Diagnosis present

## 2014-12-03 DIAGNOSIS — B372 Candidiasis of skin and nail: Secondary | ICD-10-CM | POA: Diagnosis not present

## 2014-12-03 DIAGNOSIS — I339 Acute and subacute endocarditis, unspecified: Secondary | ICD-10-CM | POA: Diagnosis not present

## 2014-12-03 DIAGNOSIS — R58 Hemorrhage, not elsewhere classified: Secondary | ICD-10-CM | POA: Diagnosis not present

## 2014-12-03 DIAGNOSIS — I330 Acute and subacute infective endocarditis: Secondary | ICD-10-CM | POA: Diagnosis present

## 2014-12-03 DIAGNOSIS — R109 Unspecified abdominal pain: Secondary | ICD-10-CM | POA: Diagnosis not present

## 2014-12-03 DIAGNOSIS — I361 Nonrheumatic tricuspid (valve) insufficiency: Secondary | ICD-10-CM | POA: Diagnosis not present

## 2014-12-03 HISTORY — DX: End stage renal disease: N18.6

## 2014-12-03 HISTORY — DX: Unspecified osteoarthritis, unspecified site: M19.90

## 2014-12-03 HISTORY — DX: Transient cerebral ischemic attack, unspecified: G45.9

## 2014-12-03 HISTORY — DX: Irritable bowel syndrome, unspecified: K58.9

## 2014-12-03 HISTORY — DX: Dependence on renal dialysis: Z99.2

## 2014-12-03 HISTORY — DX: Fibromyalgia: M79.7

## 2014-12-03 HISTORY — DX: Personal history of other medical treatment: Z92.89

## 2014-12-03 HISTORY — DX: Gastro-esophageal reflux disease without esophagitis: K21.9

## 2014-12-03 HISTORY — DX: Pure hypercholesterolemia, unspecified: E78.00

## 2014-12-03 LAB — URINALYSIS, ROUTINE W REFLEX MICROSCOPIC
Bilirubin Urine: NEGATIVE
GLUCOSE, UA: NEGATIVE mg/dL
KETONES UR: NEGATIVE mg/dL
Nitrite: NEGATIVE
Specific Gravity, Urine: 1.019 (ref 1.005–1.030)
Urobilinogen, UA: 0.2 mg/dL (ref 0.0–1.0)
pH: 5 (ref 5.0–8.0)

## 2014-12-03 LAB — BASIC METABOLIC PANEL
ANION GAP: 14 (ref 5–15)
BUN: 124 mg/dL — AB (ref 6–20)
CHLORIDE: 109 mmol/L (ref 101–111)
CO2: 12 mmol/L — ABNORMAL LOW (ref 22–32)
Calcium: 8.8 mg/dL — ABNORMAL LOW (ref 8.9–10.3)
Creatinine, Ser: 8.65 mg/dL — ABNORMAL HIGH (ref 0.44–1.00)
GFR, EST AFRICAN AMERICAN: 6 mL/min — AB (ref >60)
GFR, EST NON AFRICAN AMERICAN: 5 mL/min — AB (ref >60)
Glucose, Bld: 108 mg/dL — ABNORMAL HIGH (ref 65–99)
POTASSIUM: 5.3 mmol/L — AB (ref 3.5–5.1)
SODIUM: 135 mmol/L (ref 135–145)

## 2014-12-03 LAB — CBC WITH DIFFERENTIAL/PLATELET
BASOS ABS: 0 10*3/uL (ref 0.0–0.1)
Basophils Relative: 0 % (ref 0–1)
EOS ABS: 0 10*3/uL (ref 0.0–0.7)
EOS PCT: 0 % (ref 0–5)
HCT: 29.8 % — ABNORMAL LOW (ref 36.0–46.0)
Hemoglobin: 10.5 g/dL — ABNORMAL LOW (ref 12.0–15.0)
LYMPHS ABS: 0.9 10*3/uL (ref 0.7–4.0)
Lymphocytes Relative: 7 % — ABNORMAL LOW (ref 12–46)
MCH: 29.7 pg (ref 26.0–34.0)
MCHC: 35.2 g/dL (ref 30.0–36.0)
MCV: 84.2 fL (ref 78.0–100.0)
Monocytes Absolute: 0.4 10*3/uL (ref 0.1–1.0)
Monocytes Relative: 3 % (ref 3–12)
Neutro Abs: 11.5 10*3/uL — ABNORMAL HIGH (ref 1.7–7.7)
Neutrophils Relative %: 90 % — ABNORMAL HIGH (ref 43–77)
PLATELETS: 335 10*3/uL (ref 150–400)
RBC: 3.54 MIL/uL — AB (ref 3.87–5.11)
RDW: 13.9 % (ref 11.5–15.5)
WBC: 12.6 10*3/uL — AB (ref 4.0–10.5)

## 2014-12-03 LAB — I-STAT CHEM 8, ED
BUN: 131 mg/dL — ABNORMAL HIGH (ref 6–20)
CALCIUM ION: 1.19 mmol/L (ref 1.12–1.23)
Chloride: 110 mmol/L (ref 101–111)
Creatinine, Ser: 8.9 mg/dL — ABNORMAL HIGH (ref 0.44–1.00)
GLUCOSE: 111 mg/dL — AB (ref 65–99)
HCT: 32 % — ABNORMAL LOW (ref 36.0–46.0)
HEMOGLOBIN: 10.9 g/dL — AB (ref 12.0–15.0)
Potassium: 5.2 mmol/L — ABNORMAL HIGH (ref 3.5–5.1)
Sodium: 134 mmol/L — ABNORMAL LOW (ref 135–145)
TCO2: 13 mmol/L (ref 0–100)

## 2014-12-03 LAB — I-STAT BETA HCG BLOOD, ED (MC, WL, AP ONLY)

## 2014-12-03 LAB — URINE MICROSCOPIC-ADD ON

## 2014-12-03 MED ORDER — MYCOPHENOLATE MOFETIL 500 MG PO TABS: 1500.0000 mg | ORAL_TABLET | Freq: Two times a day (BID) | ORAL | Status: DC

## 2014-12-03 MED ORDER — AMLODIPINE BESYLATE 10 MG PO TABS
10.0000 mg | ORAL_TABLET | Freq: Two times a day (BID) | ORAL | Status: DC
  Administered 2014-12-03 – 2014-12-10 (×15): 10 mg via ORAL
  Filled 2014-12-03 (×15): qty 1

## 2014-12-03 MED ORDER — PREDNISONE 20 MG PO TABS
20.0000 mg | ORAL_TABLET | Freq: Three times a day (TID) | ORAL | Status: DC
  Administered 2014-12-03 – 2014-12-24 (×60): 20 mg via ORAL
  Filled 2014-12-03 (×54): qty 1
  Filled 2014-12-03: qty 2
  Filled 2014-12-03 (×6): qty 1

## 2014-12-03 MED ORDER — DEXTROSE 5 % IV SOLN
1.0000 g | Freq: Once | INTRAVENOUS | Status: AC
  Administered 2014-12-03: 1 g via INTRAVENOUS
  Filled 2014-12-03: qty 10

## 2014-12-03 MED ORDER — CYCLOBENZAPRINE HCL 10 MG PO TABS: 10.0000 mg | ORAL_TABLET | Freq: Every evening | ORAL | Status: DC | PRN

## 2014-12-03 MED ORDER — SIMVASTATIN 40 MG PO TABS
40.0000 mg | ORAL_TABLET | Freq: Every day | ORAL | Status: DC
  Administered 2014-12-03: 40 mg via ORAL
  Filled 2014-12-03: qty 1

## 2014-12-03 MED ORDER — HEPARIN SODIUM (PORCINE) 5000 UNIT/ML IJ SOLN
5000.0000 [IU] | Freq: Three times a day (TID) | INTRAMUSCULAR | Status: DC
  Administered 2014-12-03 – 2014-12-06 (×8): 5000 [IU] via SUBCUTANEOUS
  Filled 2014-12-03 (×8): qty 1

## 2014-12-03 MED ORDER — SODIUM POLYSTYRENE SULFONATE 15 GM/60ML PO SUSP
15.0000 g | Freq: Once | ORAL | Status: AC
  Administered 2014-12-03: 15 g via ORAL
  Filled 2014-12-03: qty 60

## 2014-12-03 MED ORDER — FAMOTIDINE 20 MG PO TABS
40.0000 mg | ORAL_TABLET | Freq: Every day | ORAL | Status: DC
  Administered 2014-12-03 – 2014-12-19 (×17): 40 mg via ORAL
  Filled 2014-12-03 (×17): qty 2

## 2014-12-03 MED ORDER — MYCOPHENOLATE MOFETIL 250 MG PO CAPS
1500.0000 mg | ORAL_CAPSULE | Freq: Two times a day (BID) | ORAL | Status: DC
Start: 2014-12-03 — End: 2014-12-20
  Administered 2014-12-03 – 2014-12-19 (×32): 1500 mg via ORAL
  Filled 2014-12-03 (×32): qty 6

## 2014-12-03 MED ORDER — OXYCODONE-ACETAMINOPHEN 5-325 MG PO TABS
1.0000 | ORAL_TABLET | ORAL | Status: DC | PRN
  Administered 2014-12-05: 1 via ORAL
  Filled 2014-12-03: qty 1

## 2014-12-03 MED ORDER — SODIUM CHLORIDE 0.9 % IJ SOLN
3.0000 mL | Freq: Two times a day (BID) | INTRAMUSCULAR | Status: DC
  Administered 2014-12-04 – 2014-12-06 (×5): 3 mL via INTRAVENOUS
  Administered 2014-12-08: 20 mL via INTRAVENOUS

## 2014-12-03 MED ORDER — ACETAMINOPHEN 500 MG PO TABS: 1000.0000 mg | ORAL_TABLET | Freq: Four times a day (QID) | ORAL | Status: DC | PRN

## 2014-12-03 MED ORDER — ONDANSETRON 4 MG PO TBDP
4.0000 mg | ORAL_TABLET | Freq: Three times a day (TID) | ORAL | Status: DC | PRN
  Administered 2014-12-05: 4 mg via ORAL
  Filled 2014-12-03 (×3): qty 1

## 2014-12-03 MED ORDER — CEFTRIAXONE SODIUM 1 G IJ SOLR
1.0000 g | INTRAMUSCULAR | Status: DC
  Administered 2014-12-04: 1 g via INTRAVENOUS
  Filled 2014-12-03: qty 10

## 2014-12-03 MED ORDER — ALENDRONATE SODIUM 70 MG PO TABS: 70.0000 mg | ORAL_TABLET | ORAL | Status: DC

## 2014-12-03 MED ORDER — METOPROLOL SUCCINATE ER 25 MG PO TB24
25.0000 mg | ORAL_TABLET | Freq: Every day | ORAL | Status: DC
  Administered 2014-12-03 – 2014-12-04 (×2): 25 mg via ORAL
  Filled 2014-12-03 (×2): qty 1

## 2014-12-03 NOTE — Progress Notes (Signed)
ANTIBIOTIC CONSULT NOTE - INITIAL  Pharmacy Consult for ceftriaxone Indication: UTI  Allergies  Allergen Reactions  . Food Swelling    Red peppers    Patient Measurements: Height: 5\' 3"  (160 cm) Weight: 237 lb (107.502 kg) IBW/kg (Calculated) : 52.4 Adjusted Body Weight:   Vital Signs: Temp: 98.2 F (36.8 C) (09/05 1642) Temp Source: Oral (09/05 1642) BP: 194/101 mmHg (09/05 1642) Pulse Rate: 104 (09/05 1642) Intake/Output from previous day:   Intake/Output from this shift:    Labs:  Recent Labs  12/03/14 1239 12/03/14 1247  WBC 12.6*  --   HGB 10.5* 10.9*  PLT 335  --   CREATININE 8.65* 8.90*   Estimated Creatinine Clearance: 10.1 mL/min (by C-G formula based on Cr of 8.9). No results for input(s): VANCOTROUGH, VANCOPEAK, VANCORANDOM, GENTTROUGH, GENTPEAK, GENTRANDOM, TOBRATROUGH, TOBRAPEAK, TOBRARND, AMIKACINPEAK, AMIKACINTROU, AMIKACIN in the last 72 hours.   Microbiology: Recent Results (from the past 720 hour(s))  Wet prep, genital     Status: None   Collection Time: 11/18/14 11:50 AM  Result Value Ref Range Status   Yeast Wet Prep HPF POC NONE SEEN NONE SEEN Final   Trich, Wet Prep NONE SEEN NONE SEEN Final   Clue Cells Wet Prep HPF POC NONE SEEN NONE SEEN Final   WBC, Wet Prep HPF POC NONE SEEN NONE SEEN Final    Medical History: Past Medical History  Diagnosis Date  . Hypertension   . Cardiac arrhythmia   . SVT (supraventricular tachycardia)     "today, last week, 2 wk ago; maybe 1 month ago, etc; started w/in last 3-4 yrs"(07/20/2012)  . Fainting     "~ 1 month ago; probably related to SVT" (07/20/2012)  . Heart murmur     "small" (07/20/2012)  . Shortness of breath     "related to SVT episodes" (07/20/2012)  . Anemia     "today" (07/20/2012)  . Chest pain at rest     "related to SVT" (07/20/2012)  . Sleep apnea     "don't wear mask" (07/20/2012)  . Daily headache   . Anxiety   . Lupus (systemic lupus erythematosus)   . Renal disorder   .  Chronic renal failure, stage 4 (severe)   . GERD (gastroesophageal reflux disease)   . Fibromyalgia   . Irritable bowel syndrome (IBS)     Medications:  Scheduled:  . amLODipine  10 mg Oral BID  . famotidine  40 mg Oral Daily  . heparin  5,000 Units Subcutaneous 3 times per day  . metoprolol succinate  25 mg Oral Daily  . mycophenolate  1,500 mg Oral BID  . predniSONE  20 mg Oral TID  . simvastatin  40 mg Oral q1800  . sodium chloride  3 mL Intravenous Q12H   Infusions:   Assessment: 39 yo female with UTI will be continued on ceftriaxone.  Patient got one dose of ceftriaxone 1g at 1427 today.  Goal of Therapy:  Resolution of infection  Plan:  - Ceftriaxone 1 g iv q24h, next dose at 1400 on 12/04/14  Shalece Staffa, Tsz-Yin 12/03/2014,5:34 PM

## 2014-12-03 NOTE — Progress Notes (Signed)

## 2014-12-03 NOTE — ED Provider Notes (Addendum)
CSN: [id number]     Arrival date & time 12/03/14  1038 History   First MD Initiated Contact with Patient 12/03/14 1109     Chief Complaint  Patient presents with  . Hematuria     (Consider location/radiation/quality/duration/timing/severity/associated sxs/prior Treatment) HPI Comments: Patient presents with left flank pain. She has a history of chronic kidney disease with lupus nephritis. She had a kidney biopsy on August 25. Per the patient, the biopsy was initially started on the left side but they were able to obtain the biopsy and ultimately a biopsy was obtained from the right kidney. She's completed 3 days worth of IV Solu-Medrol and blood transfusions over the last week. She had a PICC line placed because they were unable to obtain IV access. She presents today with hematuria which started this morning. She's also had some burning on urination which started this morning. She's had some increased pain to her left flank around the area where they did the biopsy. She states that it appears more swollen. She denies any known fevers. She denies any nausea or vomiting. She also is requesting that her PICC line be removed. She states it's irritating her.  Patient is a 39 y.o. female presenting with hematuria.  Hematuria Associated symptoms include abdominal pain. Pertinent negatives include no chest pain, no headaches and no shortness of breath.    Past Medical History  Diagnosis Date  . Hypertension   . Cardiac arrhythmia   . SVT (supraventricular tachycardia)     "today, last week, 2 wk ago; maybe 1 month ago, etc; started w/in last 3-4 yrs"(07/20/2012)  . Fainting     "~ 1 month ago; probably related to SVT" (07/20/2012)  . Heart murmur     "small" (07/20/2012)  . Shortness of breath     "related to SVT episodes" (07/20/2012)  . Anemia     "today" (07/20/2012)  . Chest pain at rest     "related to SVT" (07/20/2012)  . Sleep apnea     "don't wear mask" (07/20/2012)  . Daily headache   .  Anxiety   . Lupus (systemic lupus erythematosus)   . Renal disorder   . Chronic renal failure, stage 4 (severe)    Past Surgical History  Procedure Laterality Date  . Appendectomy  2011  . Tubal ligation  2010  . Cesarean section  2001; 2010  . Cervical biopsy  2013  . Ablation      07/21/12 Dr. Cristopher Peru    Family History  Problem Relation Age of Onset  . Cancer Mother     breast and ovarian  . BRCA 1/2 Sister    Social History  Substance Use Topics  . Smoking status: Never Smoker   . Smokeless tobacco: Never Used  . Alcohol Use: No     Comment: 07/20/2012 "might have a beer once/yr"   OB History    Gravida Para Term Preterm AB TAB SAB Ectopic Multiple Living   _0 0 0 0 0 0 0 2     Review of Systems  Constitutional: Negative for fever, chills, diaphoresis and fatigue.  HENT: Negative for congestion, rhinorrhea and sneezing.   Eyes: Negative.   Respiratory: Negative for cough, chest tightness and shortness of breath.   Cardiovascular: Negative for chest pain and leg swelling.  Gastrointestinal: Positive for abdominal pain. Negative for nausea, vomiting, diarrhea and blood in stool.  Genitourinary: Positive for dysuria, hematuria and flank pain. Negative for frequency and difficulty urinating.  Musculoskeletal: Negative for back pain and arthralgias.  Skin: Negative for rash.  Neurological: Negative for dizziness, speech difficulty, weakness, numbness and headaches.      Allergies  Food  Home Medications   Prior to Admission medications   Medication Sig Start Date End Date Taking? Authorizing Provider  acetaminophen (TYLENOL) 500 MG tablet Take 1,000 mg by mouth every 6 (six) hours as needed (pain).    Yes Historical Provider, MD  alendronate (FOSAMAX) 70 MG tablet Take 70 mg by mouth once a week. Take on Wednesdays with a full glass of water on an empty stomach.   Yes Historical Provider, MD  amLODipine (NORVASC) 10 MG tablet Take 1 tablet (10 mg total) by  mouth daily. Patient taking differently: Take 10 mg by mouth 2 (two) times daily.  11/15/12  Yes Oswald Hillock, MD  Calcium Carbonate Antacid (TUMS PO) Take 1-2 tablets by mouth daily as needed (heartburn).   Yes Historical Provider, MD  cyclobenzaprine (FLEXERIL) 10 MG tablet Take 10 mg by mouth at bedtime as needed for muscle spasms.    Yes Historical Provider, MD  famotidine (PEPCID) 40 MG tablet Take 1 tablet (40 mg total) by mouth daily. 11/13/14  Yes Etta Quill, NP  metoprolol succinate (TOPROL-XL) 25 MG 24 hr tablet Take 25 mg by mouth daily.   Yes Historical Provider, MD  mycophenolate (CELLCEPT) 500 MG tablet Take 1,500 mg by mouth 2 (two) times daily.    Yes Historical Provider, MD  ondansetron (ZOFRAN ODT) 4 MG disintegrating tablet 91m ODT q4 hours prn nausea/vomit 10/22/14  Yes Robyn M Hess, PA-C  oxyCODONE-acetaminophen (PERCOCET) 5-325 MG per tablet Take 1 tablet by mouth every 4 (four) hours as needed for severe pain. 11/26/14  Yes EDaleen Bo MD  predniSONE (DELTASONE) 20 MG tablet Take 20 mg by mouth 3 (three) times daily.    Yes Historical Provider, MD  simvastatin (ZOCOR) 40 MG tablet Take 40 mg by mouth daily.   Yes Historical Provider, MD   BP 168/86 mmHg  Pulse 93  Temp(Src) 97.8 F (36.6 C) (Oral)  Resp 16  Ht _0  (1.6 m)  Wt 237 lb (107.502 kg)  BMI 41.99 kg/m2  SpO2 99%  LMP 11/17/2014 (Approximate) Physical Exam  Constitutional: She is oriented to person, place, and time. She appears well-developed and well-nourished.  HENT:  Head: Normocephalic and atraumatic.  Eyes: Pupils are equal, round, and reactive to light.  Neck: Normal range of motion. Neck supple.  Cardiovascular: Normal rate, regular rhythm and normal heart sounds.   Pulmonary/Chest: Effort normal and breath sounds normal. No respiratory distress. She has no wheezes. She has no rales. She exhibits no tenderness.  PICC line in place in the right upper chest wall without signs of infection   Abdominal: Soft. Bowel sounds are normal. There is tenderness. There is no rebound and no guarding.  Mild tenderness the left upper back and left midabdomen. There is a 5 cm area of ecchymosis to the left flank. There is no warmth.  Musculoskeletal: Normal range of motion. She exhibits no edema.  Lymphadenopathy:    She has no cervical adenopathy.  Neurological: She is alert and oriented to person, place, and time.  Skin: Skin is warm and dry. No rash noted.  Psychiatric: She has a normal mood and affect.    ED Course  Procedures (including critical care time) Labs Review Labs Reviewed  URINALYSIS, ROUTINE W REFLEX [facility]OSCOPIC [facility] - Abnormal; Notable for the following:  APPearance TURBID (*)    Hgb urine dipstick LARGE (*)    Protein, ur >300 (*)    Leukocytes, UA MODERATE (*)    All other components within normal limits  CBC WITH DIFFERENTIAL/PLATELET - Abnormal; Notable for the following:    WBC 12.6 (*)    RBC 3.54 (*)    Hemoglobin 10.5 (*)    HCT 29.8 (*)    Neutrophils Relative % 90 (*)    Neutro Abs 11.5 (*)    Lymphocytes Relative 7 (*)    All other components within normal limits  URINE MICROSCOPIC-ADD ON - Abnormal; Notable for the following:    Squamous Epithelial / LPF FEW (*)    Bacteria, UA MANY (*)    Casts GRANULAR CAST (*)    All other components within normal limits  BASIC METABOLIC PANEL - Abnormal; Notable for the following:    Potassium 5.3 (*)    CO2 12 (*)    Glucose, Bld 108 (*)    BUN 124 (*)    Creatinine, Ser 8.65 (*)    Calcium 8.8 (*)    GFR calc non Af Amer 5 (*)    GFR calc Af Amer 6 (*)    All other components within normal limits  I-STAT CHEM 8, ED - Abnormal; Notable for the following:    Sodium 134 (*)    Potassium 5.2 (*)    BUN 131 (*)    Creatinine, Ser 8.90 (*)    Glucose, Bld 111 (*)    Hemoglobin 10.9 (*)    HCT 32.0 (*)    All other components within normal limits  URINE CULTURE  I-STAT BETA HCG BLOOD, ED  ([facility], [facility], [facility] ONLY)    Imaging Review Ct Abdomen Pelvis Wo Contrast  12/03/2014   CLINICAL DATA:  39 year old female with left-sided flank pain following renal biopsy. Pain with urination. Hematuria. Left renal biopsy performed on 09/03/2014, and right renal biopsy performed on 11/22/2014.  EXAM: CT ABDOMEN AND PELVIS WITHOUT CONTRAST  TECHNIQUE: Multidetector CT imaging of the abdomen and pelvis was performed following the standard protocol without IV contrast.  COMPARISON:  CT the abdomen and pelvis 11/26/2014.  FINDINGS: Lower chest: Central venous catheter terminating in the right atrium. Minimal bibasilar subsegmental atelectasis. Mild cardiomegaly.  Hepatobiliary: No discrete cystic or solid hepatic lesion identified on today's noncontrast CT examination. The unenhanced appearance of the gallbladder is normal.  Pancreas: No definite pancreatic mass or peripancreatic inflammatory changes on today's noncontrast CT examination.  Spleen: Unremarkable.  Adrenals/Urinary Tract: Small amount of subcapsular high attenuation fluid and gas in the lower pole of the right kidney posteriorly, similar to the prior examination from 11/26/2014, measuring up to 3.9 x 1.7 cm on today's examination. The unenhanced appearance of the left kidney is unremarkable. No calcifications identified within the collecting system of either kidney, along the course of either ureter or within the lumen of the urinary bladder. Bilateral perinephric stranding and periureteric stranding is similar to prior examination. Bilateral adrenal glands are normal in appearance. Urinary bladder is unremarkable in appearance.  Stomach/Bowel: The unenhanced appearance of the stomach is normal. No pathologic dilatation of small bowel or colon. Mild colonic wall thickening involving the cecum, ascending colon and proximal transverse colon, which may suggest a mild colitis. Status post appendectomy.  Vascular/Lymphatic: No atherosclerotic calcifications or  definite aneurysm identified in the abdominal or pelvic vasculature. Numerous prominent but nonenlarged retroperitoneal lymph nodes are similar to the prior study, presumably reactive. No  lymphadenopathy noted elsewhere in the abdomen or pelvis.  Reproductive: Uterus and bilateral ovaries are unremarkable in appearance.  Other: Small amount of high attenuation fluid and gas again noted in the left retroperitoneum posterior to the left kidney, tracking down toward the anatomic pelvis. This is very similar to the prior study, measuring up to 4.8 x 1.6 cm on today's examination. Trace volume of ascites. Small volume of retroperitoneal fluid, similar to the prior examination. No pneumoperitoneum. Diffuse body wall edema.  Musculoskeletal: There are no aggressive appearing lytic or blastic lesions noted in the visualized portions of the skeleton.  IMPRESSION: 1. 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 These both contain small locules of gas, also similar to the prior examination. No findings to suggest recurrent hemorrhage or overt findings to suggest infection of either of these collections at this time. 2. Thickening of the cecum, ascending colon and proximal transverse colon, suspicious for potential colitis. Clinical correlation is recommended. 3. Chronic bilateral perinephric and periureteric stranding, similar to the prior examination, potentially related to lupus nephritis. 4. Trace volume of ascites and diffuse body wall edema. 5. Mild cardiomegaly. 6. Additional incidental findings, as above.   Electronically Signed   By: Vinnie Langton M.D.   On: 12/03/2014 14:00   I have personally reviewed and evaluated these images and lab results as part of my medical decision-making.   EKG Interpretation None     ED ECG REPORT   Date: 12/03/2014  Rate: 97  Rhythm: normal sinus  rhythm  QRS Axis: normal  Intervals: normal  ST/T Wave abnormalities: nonspecific ST/T changes  Conduction Disutrbances:none  Narrative Interpretation:   Old EKG Reviewed: unchanged  I have personally reviewed the EKG tracing and agree with the computerized printout as noted.   MDM   Final diagnoses:  AKI (acute kidney injury)  UTI (lower urinary tract infection)    Patient presents with worsening kidney function and evidence of a UTI. Her CT scan doesn't show any worsening hematoma around the kidney or the retroperitoneal space. I did speak with the nephrologist on call, Dr. Moshe Cipro. The nephrologist felt that patient could either be discharged with close follow-up were admitted for further treatment. Patient wasn't comfortable being discharged. I feel that it is reasonable to admit her for IV antibiotics and closely follow her creatinine and potassium. Her potassium is mildly elevated. She was given dose of Kayexalate in the ED. I spoke with the internal medicine teaching service who is on call for unassigned patients and they were amenable to admitting the patient.    Malvin Johns, MD 12/03/14 0932  Malvin Johns, MD 12/03/14 [phone]

## 2014-12-03 NOTE — ED Notes (Signed)
Admitting MD at bedside.

## 2014-12-03 NOTE — Plan of Care (Signed)
Problem: Phase I Progression Outcomes Goal: Initial discharge plan identified Outcome: Completed/Met Date Met:  12/03/14 To return home with family

## 2014-12-03 NOTE — Progress Notes (Signed)
[name] [id number] Admission Data: 12/03/2014 4:45 PM Attending Provider: Aldine Contes, MD  WYO:VZCHY,IFOYD D, MD Consults/ Treatment Team:    [name] is a 39 y.o. female patient admitted from ED awake, alert  & orientated  X 3,  Prior, VSS - Blood pressure 194/101, pulse 104, temperature 98.2 F (36.8 C), temperature source Oral, resp. rate 18, height 5\' 3"  (1.6 m), weight 107.502 kg (237 lb), last menstrual period 11/17/2014, SpO2 100 %., Room air,  no c/o shortness of breath, no c/o chest pain, no distress noted. Tele # [phone] placed and pt is currently running:normal sinus rhythm.   IV site WDL:  subclavian right, condition patent and no redness with a transparent dsg that's clean dry and intact.  Allergies:   Allergies  Allergen Reactions  . Food Swelling    Red peppers     Past Medical History  Diagnosis Date  . Hypertension   . Cardiac arrhythmia   . SVT (supraventricular tachycardia)     "today, last week, 2 wk ago; maybe 1 month ago, etc; started w/in last 3-4 yrs"(07/20/2012)  . Fainting     "~ 1 month ago; probably related to SVT" (07/20/2012)  . Heart murmur     "small" (07/20/2012)  . Shortness of breath     "related to SVT episodes" (07/20/2012)  . Anemia     "today" (07/20/2012)  . Chest pain at rest     "related to SVT" (07/20/2012)  . Sleep apnea     "don't wear mask" (07/20/2012)  . Daily headache   . Anxiety   . Lupus (systemic lupus erythematosus)   . Renal disorder   . Chronic renal failure, stage 4 (severe)     History: will be obtained from the patient by admission nurse Tobacco/alcohol: denied none  Pt orientation to unit, room and routine. Information packet given to patient/family and safety video watched.  Admission INP armband ID verified with patient and in place. SR up x 2, fall risk assessment complete with Patient and family verbalizing understanding of risks associated with falls. Pt verbalizes an understanding of how to use the call  bell and to call for help before getting out of bed.  Skin, clean-dry- intact without evidence of bruising, or skin tears.   No evidence of skin break down noted on exam. color normal  MD paged and notified of patient admission to room.  Will cont to monitor and assist as needed.  Salley Slaughter, RN 12/03/2014 4:45 PM

## 2014-12-03 NOTE — ED Notes (Signed)
Pt c/o pain and blood with urination. Pt reports also needing PICC line removed. Pt had PICC placed here on Thursday.

## 2014-12-03 NOTE — H&P (Signed)
Date: 12/03/2014               Patient Name:  [name] MRN: [id number]  DOB: [birth year] Age / Sex: 39 y.o., female   PCP: Lin Landsman, MD         Medical Service: Internal Medicine Teaching Service         Attending Physician: Dr. Aldine Contes, MD    First Contact: Dr. Benjamine Mola Pager: [phone]  Second Contact: Dr. Genene Churn Pager: [phone]       After Hours (After 5p/  First Contact Pager: [phone]  weekends / holidays): Second Contact Pager: [phone]   Chief Complaint: Dysuria, hematuria  History of Present Illness: 39 y/o woman with PMHx of SVT s/p ablation 2014, anemia of chronic disease, hypertension, and SLE presents to hospital after 2 days of worsening dysuria 1 day of gross hematuria. 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 Consultation of postprocedural blood loss with hemoglobin decreased from 8.9 to 6.8 and patient was seen in ED over the interval for total 2 units packed red blood cells transfusion and 3 doses IV Solu-Medrol. Also on by mouth prednisone 60 mg daily total during interval. She expresses urinary urgency however only voids approximately once per 5-6 hours. She does endorse some subjective fever and nausea worse at night. She does report decreased appetite and disordered day night sleep cycle. She has not expressed any shortness of breath, confusion, diarrhea, constipation, or vomiting. She does have worsening leg swelling which is painful on prolonged ambulation or standing.  Patient had a creatinine 1.6 baseline until June of this year however has been worsening from 4.54 on 7/24 to 6.23 on 8/29, now 8.9 at admission.  Meds: Current Facility-Administered Medications  Medication Dose Route Frequency Provider Last Rate Last Dose  . acetaminophen (TYLENOL) tablet 1,000 mg  1,000 mg Oral Q6H PRN Tasrif Ahmed, MD      . amLODipine (NORVASC) tablet 10 mg  10 mg Oral BID Tasrif Ahmed, MD       . cyclobenzaprine (FLEXERIL) tablet 10 mg  10 mg Oral QHS PRN Tasrif Ahmed, MD      . famotidine (PEPCID) tablet 40 mg  40 mg Oral Daily Tasrif Ahmed, MD      . metoprolol succinate (TOPROL-XL) 24 hr tablet 25 mg  25 mg Oral Daily Tasrif Ahmed, MD      . mycophenolate (CELLCEPT) tablet 1,500 mg  1,500 mg Oral BID Tasrif Ahmed, MD      . ondansetron (ZOFRAN-ODT) disintegrating tablet 4 mg  4 mg Oral Q8H PRN Tasrif Ahmed, MD      . oxyCODONE-acetaminophen (PERCOCET/ROXICET) 5-325 MG per tablet 1 tablet  1 tablet Oral Q4H PRN Tasrif Ahmed, MD      . predniSONE (DELTASONE) tablet 20 mg  20 mg Oral TID Tasrif Ahmed, MD      . simvastatin (ZOCOR) tablet 40 mg  40 mg Oral Daily Tasrif Ahmed, MD       Current Outpatient Prescriptions  Medication Sig Dispense Refill  . acetaminophen (TYLENOL) 500 MG tablet Take 1,000 mg by mouth every 6 (six) hours as needed (pain).     Marland Kitchen alendronate (FOSAMAX) 70 MG tablet Take 70 mg by mouth once a week. Take on Wednesdays with a full glass of water on an empty stomach.    Marland Kitchen amLODipine (NORVASC) 10 MG tablet Take 1 tablet (10 mg total) by mouth daily. (Patient taking differently:  Take 10 mg by mouth 2 (two) times daily. ) 30 tablet 2  . Calcium Carbonate Antacid (TUMS PO) Take 1-2 tablets by mouth daily as needed (heartburn).    . cyclobenzaprine (FLEXERIL) 10 MG tablet Take 10 mg by mouth at bedtime as needed for muscle spasms.     . famotidine (PEPCID) 40 MG tablet Take 1 tablet (40 mg total) by mouth daily. 20 tablet 0  . metoprolol succinate (TOPROL-XL) 25 MG 24 hr tablet Take 25 mg by mouth daily.    . mycophenolate (CELLCEPT) 500 MG tablet Take 1,500 mg by mouth 2 (two) times daily.     . ondansetron (ZOFRAN ODT) 4 MG disintegrating tablet 90m ODT q4 hours prn nausea/vomit 10 tablet 0  . oxyCODONE-acetaminophen (PERCOCET) 5-325 MG per tablet Take 1 tablet by mouth every 4 (four) hours as needed for severe pain. 20 tablet 0  . predniSONE (DELTASONE) 20 MG tablet  Take 20 mg by mouth 3 (three) times daily.     . simvastatin (ZOCOR) 40 MG tablet Take 40 mg by mouth daily.      Allergies: Allergies as of 12/03/2014 - Review Complete 12/03/2014  Allergen Reaction Noted  . Food Swelling 08/29/2014   Past Medical History  Diagnosis Date  . Hypertension   . Cardiac arrhythmia   . SVT (supraventricular tachycardia)     "today, last week, 2 wk ago; maybe 1 month ago, etc; started w/in last 3-4 yrs"(07/20/2012)  . Fainting     "~ 1 month ago; probably related to SVT" (07/20/2012)  . Heart murmur     "small" (07/20/2012)  . Shortness of breath     "related to SVT episodes" (07/20/2012)  . Anemia     "today" (07/20/2012)  . Chest pain at rest     "related to SVT" (07/20/2012)  . Sleep apnea     "don't wear mask" (07/20/2012)  . Daily headache   . Anxiety   . Lupus (systemic lupus erythematosus)   . Renal disorder   . Chronic renal failure, stage 4 (severe)    Past Surgical History  Procedure Laterality Date  . Appendectomy  2011  . Tubal ligation  2010  . Cesarean section  2001; 2010  . Cervical biopsy  2013  . Ablation      07/21/12 Dr. GCristopher Peru   Family History  Problem Relation Age of Onset  . Cancer Mother     breast and ovarian  . BRCA 1/2 Sister    Social History   Social History  . Marital Status: Single    Spouse Name: N/A  . Number of Children: N/A  . Years of Education: N/A   Occupational History  . Not on file.   Social History Main Topics  . Smoking status: Never Smoker   . Smokeless tobacco: Never Used  . Alcohol Use: No     Comment: 07/20/2012 "might have a beer once/yr"  . Drug Use: No  . Sexual Activity: Not Currently    Birth Control/ Protection: None   Other Topics Concern  . Not on file   Social History Narrative   Review of Systems: Review of Systems  Constitutional: Positive for fever and malaise/fatigue.  Eyes: Negative for blurred vision and double vision.  Respiratory: Negative for cough and  shortness of breath.   Cardiovascular: Positive for leg swelling. Negative for chest pain and palpitations.  Gastrointestinal: Positive for nausea and abdominal pain. Negative for vomiting, diarrhea, constipation and blood in stool.  Genitourinary: Positive for dysuria, urgency, hematuria and flank pain.  Musculoskeletal: Positive for back pain.  Skin: Negative for rash.  Neurological: Negative for dizziness and headaches.  Psychiatric/Behavioral: The patient has insomnia.     Physical Exam: Blood pressure 168/86, pulse 93, temperature 97.8 F (36.6 C), temperature source Oral, resp. rate 16, height 5' 3"  (1.6 m), weight 107.502 kg (237 lb), last menstrual period 11/17/2014, SpO2 99 %. GENERAL- alert, co-operative, NAD HEENT- Atraumatic, oral mucosa appears moist, good and intact dentition CARDIAC- RRR, no murmurs, rubs or gallops. RESP- CTAB, no wheezes or crackles. ABDOMEN- Mild TTP on left side of abdomen, no guarding or rebound, normoactive bowel sounds BACK- Left CVA TTP, firm swelling on left flank, mildly tender ecchymoses extending along waistline EXTREMITIES- pulse 2+, symmetric, 1+ pedal edema. SKIN- Warm, dry, No rash or lesion. PSYCH- Normal mood and affect, appropriate thought content and speech.  Lab results: Basic Metabolic Panel:  Recent Labs  12/03/14 1239 12/03/14 1247  NA 135 134*  K 5.3* 5.2*  CL 109 110  CO2 12*  --   GLUCOSE 108* 111*  BUN 124* 131*  CREATININE 8.65* 8.90*  CALCIUM 8.8*  --    Liver Function Tests: No results for input(s): AST, ALT, ALKPHOS, BILITOT, PROT, ALBUMIN in the last 72 hours. No results for input(s): LIPASE, AMYLASE in the last 72 hours. No results for input(s): AMMONIA in the last 72 hours. CBC:  Recent Labs  12/03/14 1239 12/03/14 1247  WBC 12.6*  --   NEUTROABS 11.5*  --   HGB 10.5* 10.9*  HCT 29.8* 32.0*  MCV 84.2  --   PLT 335  --    Cardiac Enzymes: No results for input(s): CKTOTAL, CKMB, CKMBINDEX,  TROPONINI in the last 72 hours. BNP: No results for input(s): PROBNP in the last 72 hours. D-Dimer: No results for input(s): DDIMER in the last 72 hours. CBG: No results for input(s): GLUCAP in the last 72 hours. Hemoglobin A1C: No results for input(s): HGBA1C in the last 72 hours. Fasting Lipid Panel: No results for input(s): CHOL, HDL, LDLCALC, TRIG, CHOLHDL, LDLDIRECT in the last 72 hours. Thyroid Function Tests: No results for input(s): TSH, T4TOTAL, FREET4, T3FREE, THYROIDAB in the last 72 hours. Anemia Panel: No results for input(s): VITAMINB12, FOLATE, FERRITIN, TIBC, IRON, RETICCTPCT in the last 72 hours. Coagulation: No results for input(s): LABPROT, INR in the last 72 hours. Urine Drug Screen: Drugs of Abuse     Component Value Date/Time   LABOPIA NONE DETECTED 07/21/2012 0215   COCAINSCRNUR NONE DETECTED 07/21/2012 0215   LABBENZ NONE DETECTED 07/21/2012 0215   AMPHETMU NONE DETECTED 07/21/2012 0215   THCU NONE DETECTED 07/21/2012 0215   LABBARB NONE DETECTED 07/21/2012 0215    Alcohol Level: No results for input(s): ETH in the last 72 hours. Urinalysis:  Recent Labs  12/03/14 1152  COLORURINE YELLOW  LABSPEC 1.019  PHURINE 5.0  GLUCOSEU NEGATIVE  HGBUR LARGE*  BILIRUBINUR NEGATIVE  KETONESUR NEGATIVE  PROTEINUR >300*  UROBILINOGEN 0.2  NITRITE NEGATIVE  LEUKOCYTESUR MODERATE*     Imaging results:  Ct Abdomen Pelvis Wo Contrast  12/03/2014   CLINICAL DATA:  39 year old female with left-sided flank pain following renal biopsy. Pain with urination. Hematuria. Left renal biopsy performed on 09/03/2014, and right renal biopsy performed on 11/22/2014.  EXAM: CT ABDOMEN AND PELVIS WITHOUT CONTRAST  TECHNIQUE: Multidetector CT imaging of the abdomen and pelvis was performed following the standard protocol without IV contrast.  COMPARISON:  CT the abdomen  and pelvis 11/26/2014.  FINDINGS: Lower chest: Central venous catheter terminating in the right atrium.  Minimal bibasilar subsegmental atelectasis. Mild cardiomegaly.  Hepatobiliary: No discrete cystic or solid hepatic lesion identified on today's noncontrast CT examination. The unenhanced appearance of the gallbladder is normal.  Pancreas: No definite pancreatic mass or peripancreatic inflammatory changes on today's noncontrast CT examination.  Spleen: Unremarkable.  Adrenals/Urinary Tract: Small amount of subcapsular high attenuation fluid and gas in the lower pole of the right kidney posteriorly, similar to the prior examination from 11/26/2014, measuring up to 3.9 x 1.7 cm on today's examination. The unenhanced appearance of the left kidney is unremarkable. No calcifications identified within the collecting system of either kidney, along the course of either ureter or within the lumen of the urinary bladder. Bilateral perinephric stranding and periureteric stranding is similar to prior examination. Bilateral adrenal glands are normal in appearance. Urinary bladder is unremarkable in appearance.  Stomach/Bowel: The unenhanced appearance of the stomach is normal. No pathologic dilatation of small bowel or colon. Mild colonic wall thickening involving the cecum, ascending colon and proximal transverse colon, which may suggest a mild colitis. Status post appendectomy.  Vascular/Lymphatic: No atherosclerotic calcifications or definite aneurysm identified in the abdominal or pelvic vasculature. Numerous prominent but nonenlarged retroperitoneal lymph nodes are similar to the prior study, presumably reactive. No lymphadenopathy noted elsewhere in the abdomen or pelvis.  Reproductive: Uterus and bilateral ovaries are unremarkable in appearance.  Other: Small amount of high attenuation fluid and gas again noted in the left retroperitoneum posterior to the left kidney, tracking down toward the anatomic pelvis. This is very similar to the prior study, measuring up to 4.8 x 1.6 cm on today's examination. Trace volume of  ascites. Small volume of retroperitoneal fluid, similar to the prior examination. No pneumoperitoneum. Diffuse body wall edema.  Musculoskeletal: There are no aggressive appearing lytic or blastic lesions noted in the visualized portions of the skeleton.  IMPRESSION: 1. 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 These both contain small locules of gas, also similar to the prior examination. No findings to suggest recurrent hemorrhage or overt findings to suggest infection of either of these collections at this time. 2. Thickening of the cecum, ascending colon and proximal transverse colon, suspicious for potential colitis. Clinical correlation is recommended. 3. Chronic bilateral perinephric and periureteric stranding, similar to the prior examination, potentially related to lupus nephritis. 4. Trace volume of ascites and diffuse body wall edema. 5. Mild cardiomegaly. 6. Additional incidental findings, as above.   Electronically Signed   By: Vinnie Langton M.D.   On: 12/03/2014 14:00    Other results: EKG: normal EKG, normal sinus rhythm, unchanged from previous tracings.  Assessment & Plan by Problem: Active Problems:   AKI (acute kidney injury) Acute renal failure 2/2 lupus nephritis and UTI: Presenting labs Cr 8.9, CO2 12, BUN 131, K 5.2, decreased urine output, weight gain and swelling. Hgb remains appropriate in response to recent pRBCs transfusions. WBC 12.6 is markedly up as patient was previously around 3-4s earlier this year. Might be secondary to increased by mouth steroids and IV Solu-Medrol but less likely due to long duration of steroids. Has active urinary tract infection based on UA and symptoms and risk for pyelonephritis given recent procedures and high-dose steroids. 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 Patient showing progressive but only moderate severity metabolic disorder and is in NAD. - No acute indications for hemodialysis at this time - Continue ceftriaxone 1g IV - Continue prednisone 75m PO TID - Continue Cellcept 15078mPO BID - Repeat CBC, CMP in AM - Nephrology consulted, recs appreciated  Hyperkalemia: Initial K 5.3, repeat 5.2. No EKG changes at ED. - Kayexalate 15g - Cardiac monitoring  HTN: SBPs 170s-180s at admission. Patient has chronically elevated blood pressure >140s but still increased. If severe refractory HTN results subcapsular renal hematoma could be a concern, although CT did not show progression. - Amlodipine 1075mO BID - Metoprolol succinate 79m53m  Back and flank pain: Pain present since renal biopsy. Latest pain medicine provided at ED visit in follow up. Was not previously on any chronic narcotics. - Percocet 5-379mg12mrs PRN - Flexeril 10mg 68mPRN  SVT: s/p ablation 2014, has some recurrent palpitations in recent months, currently holter monitor on board. AOCD: Pt transfused 2 units last week for anemia 2/2 renal biopsy bleeding. Current Hgb 10.9 stable. HLD: Continue home simvastatin 40mg P67mEN: Renal diet, fluid restricted DVT ppx: [place] heparin FULL CODE  Dispo: Disposition is deferred at this time, awaiting improvement of current medical problems.  The patient does have a current PCP (Betti Lin [facility]mannd [facility].  The patient does not have transportation limitations that hinder transportation to clinic appointments.  Signed: ChristoCollier Salina5/2016, 4:32 PM

## 2014-12-04 LAB — COMPREHENSIVE METABOLIC PANEL
ALBUMIN: 3 g/dL — AB (ref 3.5–5.0)
ALK PHOS: 48 U/L (ref 38–126)
ALT: 18 U/L (ref 14–54)
ANION GAP: 13 (ref 5–15)
AST: 19 U/L (ref 15–41)
BILIRUBIN TOTAL: 0.3 mg/dL (ref 0.3–1.2)
BUN: 124 mg/dL — ABNORMAL HIGH (ref 6–20)
CALCIUM: 8.5 mg/dL — AB (ref 8.9–10.3)
CO2: 14 mmol/L — AB (ref 22–32)
CREATININE: 8.15 mg/dL — AB (ref 0.44–1.00)
Chloride: 108 mmol/L (ref 101–111)
GFR calc non Af Amer: 6 mL/min — ABNORMAL LOW (ref >60)
GFR, EST AFRICAN AMERICAN: 6 mL/min — AB (ref >60)
GLUCOSE: 152 mg/dL — AB (ref 65–99)
Potassium: 5.3 mmol/L — ABNORMAL HIGH (ref 3.5–5.1)
SODIUM: 135 mmol/L (ref 135–145)
TOTAL PROTEIN: 6.6 g/dL (ref 6.5–8.1)

## 2014-12-04 LAB — CBC WITH DIFFERENTIAL/PLATELET
Basophils Absolute: 0 10*3/uL (ref 0.0–0.1)
Basophils Relative: 0 % (ref 0–1)
Eosinophils Absolute: 0 10*3/uL (ref 0.0–0.7)
Eosinophils Relative: 0 % (ref 0–5)
HEMATOCRIT: 29.9 % — AB (ref 36.0–46.0)
HEMOGLOBIN: 10.2 g/dL — AB (ref 12.0–15.0)
LYMPHS ABS: 0.3 10*3/uL — AB (ref 0.7–4.0)
Lymphocytes Relative: 3 % — ABNORMAL LOW (ref 12–46)
MCH: 29.3 pg (ref 26.0–34.0)
MCHC: 34.1 g/dL (ref 30.0–36.0)
MCV: 85.9 fL (ref 78.0–100.0)
MONOS PCT: 0 % — AB (ref 3–12)
Monocytes Absolute: 0 10*3/uL — ABNORMAL LOW (ref 0.1–1.0)
NEUTROS ABS: 10.3 10*3/uL — AB (ref 1.7–7.7)
NEUTROS PCT: 97 % — AB (ref 43–77)
Platelets: 404 10*3/uL — ABNORMAL HIGH (ref 150–400)
RBC: 3.48 MIL/uL — AB (ref 3.87–5.11)
RDW: 14.3 % (ref 11.5–15.5)
WBC: 10.6 10*3/uL — AB (ref 4.0–10.5)

## 2014-12-04 MED ORDER — METOPROLOL SUCCINATE ER 50 MG PO TB24
50.0000 mg | ORAL_TABLET | Freq: Every day | ORAL | Status: DC
  Administered 2014-12-05 – 2014-12-12 (×8): 50 mg via ORAL
  Filled 2014-12-04 (×8): qty 1

## 2014-12-04 MED ORDER — CIPROFLOXACIN HCL 250 MG PO TABS: 250.0000 mg | ORAL_TABLET | Freq: Two times a day (BID) | ORAL | Status: DC

## 2014-12-04 MED ORDER — METOPROLOL SUCCINATE ER 50 MG PO TB24: 50.0000 mg | ORAL_TABLET | Freq: Every day | ORAL | Status: DC

## 2014-12-04 MED ORDER — ATORVASTATIN CALCIUM 20 MG PO TABS
20.0000 mg | ORAL_TABLET | Freq: Every day | ORAL | Status: DC
  Administered 2014-12-04 – 2015-01-04 (×26): 20 mg via ORAL
  Filled 2014-12-04 (×26): qty 1

## 2014-12-04 MED ORDER — ATORVASTATIN CALCIUM 20 MG PO TABS: 20.0000 mg | ORAL_TABLET | Freq: Every day | ORAL | Status: DC

## 2014-12-04 MED ORDER — CIPROFLOXACIN HCL 500 MG PO TABS
250.0000 mg | ORAL_TABLET | Freq: Two times a day (BID) | ORAL | Status: DC
Start: 2014-12-04 — End: 2014-12-05
  Administered 2014-12-04 – 2014-12-05 (×2): 250 mg via ORAL
  Filled 2014-12-04 (×2): qty 1

## 2014-12-04 MED ORDER — METOPROLOL SUCCINATE ER 25 MG PO TB24
25.0000 mg | ORAL_TABLET | Freq: Once | ORAL | Status: AC
  Administered 2014-12-04: 25 mg via ORAL
  Filled 2014-12-04: qty 1

## 2014-12-04 MED ORDER — SODIUM CHLORIDE 0.9 % IJ SOLN
10.0000 mL | INTRAMUSCULAR | Status: DC | PRN
  Administered 2014-12-05 (×2): 10 mL
  Administered 2014-12-06: 20 mL
  Administered 2014-12-06 (×2): 10 mL
  Administered 2014-12-09: 20 mL
  Administered 2014-12-11 (×3): 10 mL
  Administered 2014-12-14: 20 mL
  Administered 2014-12-15: 30 mL
  Administered 2014-12-15 – 2014-12-17 (×4): 20 mL
  Administered 2014-12-18 – 2014-12-21 (×5): 10 mL
  Administered 2014-12-21: 20 mL
  Administered 2014-12-22 – 2014-12-25 (×4): 10 mL
  Administered 2014-12-26: 20 mL
  Administered 2014-12-26: 10 mL
  Administered 2014-12-26 – 2014-12-27 (×2): 20 mL
  Administered 2014-12-27 (×3): 10 mL
  Administered 2014-12-28: 20 mL
  Administered 2014-12-28: 10 mL
  Administered 2014-12-29: 30 mL
  Administered 2014-12-29 – 2014-12-30 (×5): 10 mL
  Administered 2014-12-31: 20 mL
  Administered 2014-12-31: 30 mL
  Administered 2015-01-01: 20 mL
  Administered 2015-01-02 – 2015-01-05 (×6): 10 mL
  Filled 2014-12-04 (×49): qty 40

## 2014-12-04 NOTE — Progress Notes (Signed)
MD at pager [phone] per note IM Teaching Service Contact note paged to address high blood pressure and order for removal of tunneled PICC Line. PEr IV team RN Vaughan Basta, patient needs to have this removed by IR.

## 2014-12-04 NOTE — Care Management Note (Signed)
Case Management Note  Patient Details  Name: [name] MRN: [id number] Date of Birth: 05-May-1975  Subjective/Objective:                 Spoke with patient at the bedside, she is insured through [place], denies any barriers to care. CM discussed 9 ER visits in 6 months with 1 admission. Patient stated she is returning to ER for pain management for her kidney issue. She states that she has gradually increasing pain that after about three days and it getting to a level of a six she has to come to the ER to be seen for it. CM asked patient if she has ever been diagnosed with anxiety. Patient denied that she had. CM asked patient if she could describe herself as feeling overwhelmed by her Dx of Lupus and managing that along with her kidney problems. She stated she felt very overwhelmed and did not know what to do except come to the ER when her pain became too much for her. CM suggested to patient to discuss with MD being screened for anxiety/ depression, as treatment if applicable could help her manage her chronic illness better. Patient was extremely grateful for CM taking the time to talk to her about her pain, and stated that she thinks "something like that would help," and she never recognized her feeling overwhelm as being related to anxiety, although she admitted that she almost had a panic attack when she was told she was going to be admitted.    Action/Plan:  Will continue to follow and offer resources as needed.  Expected Discharge Date:                  Expected Discharge Plan:  Home/Self Care  In-House Referral:     Discharge planning Services  CM Consult  Post Acute Care Choice:    Choice offered to:     DME Arranged:    DME Agency:     HH Arranged:    HH Agency:     Status of Service:  In process, will continue to follow  Medicare Important Message Given:    Date Medicare IM Given:    Medicare IM give by:    Date Additional Medicare IM Given:    Additional Medicare  Important Message give by:     If discussed at [place] of Stay Meetings, dates discussed:    Additional Comments:  Carles Collet, RN 12/04/2014, 12:44 PM

## 2014-12-04 NOTE — Progress Notes (Signed)
Subjective: Patient slept well o/n, with less abdominal pain and nausea. Continues to experience frank hematuria with decreased total urine output. She has improved appetite and is eating part of all offered meals. Objective: Vital signs in last 24 hours: Filed Vitals:   12/03/14 2123 12/03/14 2337 12/04/14 0532 12/04/14 1502  BP: 185/99 184/97 168/73 193/98  Pulse: 113 89 88 101  Temp: 97.8 F (36.6 C) 97.9 F (36.6 C) 97.7 F (36.5 C) 97.7 F (36.5 C)  TempSrc: Oral Oral Oral Oral  Resp: 18 18 20 18   Height:      Weight:      SpO2: 100% 100% 72% 100%   Weight change:   Intake/Output Summary (Last 24 hours) at 12/04/14 1521 Last data filed at 12/04/14 1503  Gross per 24 hour  Intake    720 ml  Output      0 ml  Net    720 ml   GENERAL- alert, co-operative, pleasant, NAD HEENT- Atraumatic, oral mucosa appears moist, good and intact dentition CARDIAC- RRR, no murmurs, rubs or gallops. RESP- CTAB, no wheezes or crackles. ABDOMEN- Left sided TTP, no guarding or rebound, normoactive bowel sounds BACK- Left CVA TTP, mildly tender ecchymoses extending along waistline EXTREMITIES- pulse 2+, symmetric, 1+ pedal edema. SKIN- Warm, dry, No rash or lesion. PSYCH- Normal mood and affect, appropriate thought content and speech.  Lab Results: Basic Metabolic Panel:  Recent Labs Lab 12/03/14 1239 12/03/14 1247 12/04/14 0538  NA 135 134* 135  K 5.3* 5.2* 5.3*  CL 109 110 108  CO2 12*  --  14*  GLUCOSE 108* 111* 152*  BUN 124* 131* 124*  CREATININE 8.65* 8.90* 8.15*  CALCIUM 8.8*  --  8.5*   Liver Function Tests:  Recent Labs Lab 12/04/14 0538  AST 19  ALT 18  ALKPHOS 48  BILITOT 0.3  PROT 6.6  ALBUMIN 3.0*   No results for input(s): LIPASE, AMYLASE in the last 168 hours. No results for input(s): AMMONIA in the last 168 hours. CBC:  Recent Labs Lab 12/03/14 1239 12/03/14 1247 12/04/14 0538  WBC 12.6*  --  10.6*  NEUTROABS 11.5*  --  10.3*  HGB 10.5*  10.9* 10.2*  HCT 29.8* 32.0* 29.9*  MCV 84.2  --  85.9  PLT 335  --  404*   Cardiac Enzymes: No results for input(s): CKTOTAL, CKMB, CKMBINDEX, TROPONINI in the last 168 hours. BNP: No results for input(s): PROBNP in the last 168 hours. D-Dimer: No results for input(s): DDIMER in the last 168 hours. CBG: No results for input(s): GLUCAP in the last 168 hours. Hemoglobin A1C: No results for input(s): HGBA1C in the last 168 hours. Fasting Lipid Panel: No results for input(s): CHOL, HDL, LDLCALC, TRIG, CHOLHDL, LDLDIRECT in the last 168 hours. Thyroid Function Tests: No results for input(s): TSH, T4TOTAL, FREET4, T3FREE, THYROIDAB in the last 168 hours. Coagulation: No results for input(s): LABPROT, INR in the last 168 hours. Anemia Panel: No results for input(s): VITAMINB12, FOLATE, FERRITIN, TIBC, IRON, RETICCTPCT in the last 168 hours. Urine Drug Screen: Drugs of Abuse     Component Value Date/Time   LABOPIA NONE DETECTED 07/21/2012 0215   COCAINSCRNUR NONE DETECTED 07/21/2012 0215   LABBENZ NONE DETECTED 07/21/2012 0215   AMPHETMU NONE DETECTED 07/21/2012 0215   THCU NONE DETECTED 07/21/2012 0215   LABBARB NONE DETECTED 07/21/2012 0215    Alcohol Level: No results for input(s): ETH in the last 168 hours. Urinalysis:  Recent Labs Lab 12/03/14  [place]  LABSPEC 1.019  PHURINE 5.0  GLUCOSEU NEGATIVE  HGBUR LARGE*  BILIRUBINUR NEGATIVE  KETONESUR NEGATIVE  PROTEINUR >300*  UROBILINOGEN 0.2  NITRITE NEGATIVE  LEUKOCYTESUR MODERATE*    Micro Results: Recent Results (from the past 240 hour(s))  Urine culture     Status: None (Preliminary result)   Collection Time: 12/03/14  1:44 PM  Result Value Ref Range Status   Specimen Description URINE, CLEAN CATCH  Final   Special Requests Normal  Final   Culture CULTURE REINCUBATED FOR BETTER GROWTH  Final   Report Status PENDING  Incomplete   Studies/Results: Ct Abdomen Pelvis Wo Contrast  12/03/2014    CLINICAL DATA:  39 year old female with left-sided flank pain following renal biopsy. Pain with urination. Hematuria. Left renal biopsy performed on 09/03/2014, and right renal biopsy performed on 11/22/2014.  EXAM: CT ABDOMEN AND PELVIS WITHOUT CONTRAST  TECHNIQUE: Multidetector CT imaging of the abdomen and pelvis was performed following the standard protocol without IV contrast.  COMPARISON:  CT the abdomen and pelvis 11/26/2014.  FINDINGS: Lower chest: Central venous catheter terminating in the right atrium. Minimal bibasilar subsegmental atelectasis. Mild cardiomegaly.  Hepatobiliary: No discrete cystic or solid hepatic lesion identified on today's noncontrast CT examination. The unenhanced appearance of the gallbladder is normal.  Pancreas: No definite pancreatic mass or peripancreatic inflammatory changes on today's noncontrast CT examination.  Spleen: Unremarkable.  Adrenals/Urinary Tract: Small amount of subcapsular high attenuation fluid and gas in the lower pole of the right kidney posteriorly, similar to the prior examination from 11/26/2014, measuring up to 3.9 x 1.7 cm on today's examination. The unenhanced appearance of the left kidney is unremarkable. No calcifications identified within the collecting system of either kidney, along the course of either ureter or within the lumen of the urinary bladder. Bilateral perinephric stranding and periureteric stranding is similar to prior examination. Bilateral adrenal glands are normal in appearance. Urinary bladder is unremarkable in appearance.  Stomach/Bowel: The unenhanced appearance of the stomach is normal. No pathologic dilatation of small bowel or colon. Mild colonic wall thickening involving the cecum, ascending colon and proximal transverse colon, which may suggest a mild colitis. Status post appendectomy.  Vascular/Lymphatic: No atherosclerotic calcifications or definite aneurysm identified in the abdominal or pelvic vasculature. Numerous  prominent but nonenlarged retroperitoneal lymph nodes are similar to the prior study, presumably reactive. No lymphadenopathy noted elsewhere in the abdomen or pelvis.  Reproductive: Uterus and bilateral ovaries are unremarkable in appearance.  Other: Small amount of high attenuation fluid and gas again noted in the left retroperitoneum posterior to the left kidney, tracking down toward the anatomic pelvis. This is very similar to the prior study, measuring up to 4.8 x 1.6 cm on today's examination. Trace volume of ascites. Small volume of retroperitoneal fluid, similar to the prior examination. No pneumoperitoneum. Diffuse body wall edema.  Musculoskeletal: There are no aggressive appearing lytic or blastic lesions noted in the visualized portions of the skeleton.  IMPRESSION: 1. 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 These both contain small locules of gas, also similar to the prior examination. No findings to suggest recurrent hemorrhage or overt findings to suggest infection of either of these collections at this time. 2. Thickening of the cecum, ascending colon and proximal transverse colon, suspicious for potential colitis. Clinical correlation is recommended. 3. 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 4. Trace volume of ascites and diffuse body wall edema. 5. Mild cardiomegaly. 6. Additional incidental findings, as above.   Electronically Signed   By: Vinnie Langton M.D.   On: 12/03/2014 14:00   Medications: I have reviewed the patient's current medications. Scheduled Meds: . amLODipine  10 mg Oral BID  . atorvastatin  20 mg Oral q1800  . cefTRIAXone (ROCEPHIN)  IV  1 g Intravenous Q24H  . famotidine  40 mg Oral Daily  . heparin  5,000 Units Subcutaneous 3 times per day  .  metoprolol succinate  25 mg Oral Daily  . mycophenolate  1,500 mg Oral BID  . predniSONE  20 mg Oral TID  . 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 Hgb stable. WBC is most likely from high steroid dose. Patient has severe renal insufficiency but at this time only treatments are antibiotics and continuing her immunosuppressive treatments. Patient would most likely benefit most with outpatient treatment with at least weekly follow up. - No acute indications for hemodialysis at this time - Convert ceftriaxone IV to ciprofloxacin PO for discharge medication - Continue prednisone 20mg  PO TID - Continue Cellcept 1500mg  PO BID - Nephrology consulted, per recs will continue established Rx for nephritis  Hyperkalemia: K 5.3, with 15g kayexalate PO last night, no EKG changes. Probably resume Kayexalate if potassium persists > 5.5 - Cardiac monitoring  HTN: SBPs 170s-180s at admission. Chronic HTN worse than baseline on this admission. Only on CCB and BB, pt m/l cannot tolerate ACE/ARB or diuretic treatment. - Amlodipine 10mg  PO BID - Increase Metoprolol succinate to 50mg  PO daily  Back and flank pain: Pain present since renal biopsy. Latest pain medicine provided at ED visit in follow up. Was not previously on any chronic narcotics. - Percocet 5-325mg  q4hrs PRN - Flexeril 10mg  qHS PRN  SVT: s/p ablation 2014, has some recurrent palpitations in recent months, currently holter monitor on board. AOCD: Current Hgb 10.2 stable. HLD: Continue home simvastatin 40mg  PO  FEN: Renal diet, fluid restricted DVT ppx: [place] heparin FULL CODE  Dispo: Probable discharge to home if clinical improvement continues today.  The  patient does have a current PCP Lin Landsman, MD) and does not need an [facility] hospital follow-up appointment after discharge.  The patient does not have transportation limitations that hinder transportation to clinic appointments.   LOS: 1 day   Collier Salina, MD 12/04/2014, 3:21 PM

## 2014-12-04 NOTE — Progress Notes (Signed)
Pharmacist Provided - Patient Medication Education Prior to Discharge   [name] is an 39 y.o. female who presented to [facility] on 12/03/2014 with a chief complaint of  Chief Complaint  Patient presents with  . Hematuria     [x]  Patient will be discharged with 2 new medications  The following medications were discussed with the patient: Metoprolol succinate, atorvastatin, ciprofloxacin  Pain Control medications: []  Yes    [x]  No  Diabetes Medications: []  Yes    [x]  No  Heart Failure Medications: []  Yes    [x]  No  Anticoagulation Medications:  []  Yes    [x]  No  Antibiotics at discharge: [x]  Yes    []  No  Allergy Assessment Completed and Updated: []  Yes    [x]  No Identified Patient Allergies:  Allergies  Allergen Reactions  . Food Swelling    Red peppers     Medication Adherence Assessment: [x]  Excellent (no doses missed/week)      []  Good (1 dose missed/week)      []  Partial (2-3 doses missed/week)      []  Poor (>3 doses missed/week)  Barriers to Obtaining Medications: [x]  Yes []  No  [name] expressed concern with obtaining medication, due to riding the bus and being scared of going by herself because she gets dizzy. She plans on contacting [employer] for transportation services and having someone go with her when she needs medications.   Assessment: [name] has good understanding of her medications and was aware of her medication changes made during this hospitalization. The pt verbalized understanding of the medication changes and side effects to monitor for.  Time spent preparing for discharge counseling: 20 min Time spent counseling patient: 20 min  [name], PharmD Clinical Pharmacy Resident Pager # [phone] 12/04/2014 5:54 PM

## 2014-12-04 NOTE — Progress Notes (Signed)
Spoke with MD Charlynn Grimes, patient will not be dc'd until tomorrow pending removal of tunneled PICC line by IR.

## 2014-12-04 NOTE — Consult Note (Signed)
[facility] Renal Consultation Note  Requesting MD: Dareen Piano Indication for Consultation: A on CRF- lupus nephritis  HPI:  [name] is a 39 y.o. female with PMhx significant for fibromyalgia, IBS, SVT, OSA, obesity as well as Lupus with lupus nephritis followed by Dr. Florene Glen at [facility].  Regarding her lupus nephritis- it appears it was diagnosed in August of 2014 when biopsy showed diffuse proliferative GN and was treated with steroids and cellcept.  She seemed to stabilize in 2015 with a creatinine in the mid ones.  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  Then, unfortunately she developed worsening renal insufficiency so underwent another biopsy on 8/25.  This biopsy was complicated by a hematoma and it revealed again diffuse proliferative changes with crescents indicating that it could respond to immune suppressing therapy.  She under went IV solumedrol, now on high dose prednisone and is also on max dose cellcept.  She saw Dr. Florene Glen on 9/1 and the plan was for weekly labs - creatinine was 6.8 at that time.  She then came to the ER on 9/5 with c/o continued flank pain with hematuria and dysuria with subjective fevers.  Labs showed creatinine in the high 8's- hgb was good and urine looks like UTI with TNTC WBCs.  Imaging was stable - still indicating hematoma but no obstruction. She has been started on ceftriaxone but prednisone and cellcept have been continued.  There is no UOP recorded but she is making some- creatinine is down to 8.15 today. She is not felling great but says better than yesterday  CREATININE, SER  Date/Time Value Ref Range Status  12/04/2014 05:38 AM 8.15* 0.44 - 1.00 mg/dL Final  12/03/2014 12:47 PM 8.90* 0.44 - 1.00 mg/dL Final  12/03/2014 12:39 PM 8.65* 0.44 - 1.00 mg/dL Final  11/26/2014 11:46 AM 6.23* 0.44 - 1.00 mg/dL Final  11/18/2014 10:43  AM 6.08* 0.44 - 1.00 mg/dL Final  11/13/2014 10:51 AM 5.80* 0.44 - 1.00 mg/dL Final  10/21/2014 08:03 PM 4.54* 0.44 - 1.00 mg/dL Final  08/29/2014 11:27 AM 1.66* 0.44 - 1.00 mg/dL Final  06/29/2014 12:48 AM 1.88* 0.50 - 1.10 mg/dL Final  01/07/2014 08:03 AM 1.50* 0.50 - 1.10 mg/dL Final  10/05/2013 11:53 PM 1.60* 0.50 - 1.10 mg/dL Final  01/25/2013 12:02 PM 1.47* 0.50 - 1.10 mg/dL Final  11/15/2012 06:10 AM 1.51* 0.50 - 1.10 mg/dL Final  11/14/2012 05:35 AM 1.44* 0.50 - 1.10 mg/dL Final  11/13/2012 04:10 AM 1.59* 0.50 - 1.10 mg/dL Final  11/12/2012 04:40 AM 1.78* 0.50 - 1.10 mg/dL Final  11/11/2012 06:01 AM 1.88* 0.50 - 1.10 mg/dL Final  11/10/2012 11:40 AM 2.10* 0.50 - 1.10 mg/dL Final  11/10/2012 03:01 AM 2.52* 0.50 - 1.10 mg/dL Final  07/21/2012 09:09 PM 0.64 0.50 - 1.10 mg/dL Final  07/21/2012 06:15 AM 0.71 0.50 - 1.10 mg/dL Final  07/20/2012 02:41 PM 0.79 0.50 - 1.10 mg/dL Final  07/09/2012 08:01 PM 0.89 0.50 - 1.10 mg/dL Final  07/07/2011 02:12 PM 0.64 0.50 - 1.10 mg/dL Final  08/24/2010 01:17 AM 0.53 0.4 - 1.2 mg/dL Final  06/05/2010 02:53 PM 0.9 0.4 - 1.2 mg/dL Final  01/16/2010 08:47 AM 0.9 0.4 - 1.2 mg/dL Final  09/08/2009 06:38 AM 0.61 0.4 - 1.2 mg/dL Final  09/03/2009 09:51 PM 0.71 0.4 - 1.2 mg/dL Final  08/21/2009 11:12 PM 0.77 0.4 - 1.2 mg/dL Final  PMHx:   Past Medical History  Diagnosis Date  . Hypertension   . Cardiac arrhythmia   . SVT (supraventricular tachycardia)     "today, last week, 2 wk ago; maybe 1 month ago, etc; started w/in last 3-4 yrs"(07/20/2012)  . Fainting     "~ 1 month ago; probably related to SVT" (07/20/2012)  . Heart murmur     "small" (07/20/2012)  . Shortness of breath     "related to SVT episodes" (07/20/2012)  . Anemia     "today" (07/20/2012)  . Chest pain at rest     "related to SVT" (07/20/2012)  . Sleep apnea     "don't wear mask" (07/20/2012)  . Daily headache   . Anxiety   . Lupus (systemic lupus erythematosus)   . Renal  disorder   . Chronic renal failure, stage 4 (severe)   . GERD (gastroesophageal reflux disease)   . Fibromyalgia   . Irritable bowel syndrome (IBS)     Past Surgical History  Procedure Laterality Date  . Appendectomy  2011  . Tubal ligation  2010  . Cesarean section  2001; 2010  . Cervical biopsy  2013  . Ablation      07/21/12 Dr. Cristopher Peru     Family Hx:  Family History  Problem Relation Age of Onset  . Cancer Mother     breast and ovarian  . BRCA 1/2 Sister     Social History:  reports that she has never smoked. She has never used smokeless tobacco. She reports that she does not drink alcohol or use illicit drugs.  Allergies:  Allergies  Allergen Reactions  . Food Swelling    Red peppers    Medications: Prior to Admission medications   Medication Sig Start Date End Date Taking? Authorizing Provider  acetaminophen (TYLENOL) 500 MG tablet Take 1,000 mg by mouth every 6 (six) hours as needed (pain).    Yes Historical Provider, MD  alendronate (FOSAMAX) 70 MG tablet Take 70 mg by mouth once a week. Take on Wednesdays with a full glass of water on an empty stomach.   Yes Historical Provider, MD  amLODipine (NORVASC) 10 MG tablet Take 1 tablet (10 mg total) by mouth daily. Patient taking differently: Take 10 mg by mouth 2 (two) times daily.  11/15/12  Yes Oswald Hillock, MD  Calcium Carbonate Antacid (TUMS PO) Take 1-2 tablets by mouth daily as needed (heartburn).   Yes Historical Provider, MD  cyclobenzaprine (FLEXERIL) 10 MG tablet Take 10 mg by mouth at bedtime as needed for muscle spasms.    Yes Historical Provider, MD  famotidine (PEPCID) 40 MG tablet Take 1 tablet (40 mg total) by mouth daily. 11/13/14  Yes Etta Quill, NP  metoprolol succinate (TOPROL-XL) 25 MG 24 hr tablet Take 25 mg by mouth daily.   Yes Historical Provider, MD  mycophenolate (CELLCEPT) 500 MG tablet Take 1,500 mg by mouth 2 (two) times daily.    Yes Historical Provider, MD  ondansetron (ZOFRAN ODT)  4 MG disintegrating tablet 59m ODT q4 hours prn nausea/vomit 10/22/14  Yes Robyn M Hess, PA-C  oxyCODONE-acetaminophen (PERCOCET) 5-325 MG per tablet Take 1 tablet by mouth every 4 (four) hours as needed for severe pain. 11/26/14  Yes EDaleen Bo MD  predniSONE (DELTASONE) 20 MG tablet Take 20 mg by mouth 3 (three) times daily.    Yes Historical Provider, MD  simvastatin (ZOCOR) 40 MG tablet Take 40 mg by mouth daily.   Yes Historical  Provider, MD    I have reviewed the patient's current medications.  Labs:  Results for orders placed or performed during the hospital encounter of 12/03/14 (from the past 48 hour(s))  Urinalysis, Routine w reflex microscopic-may I&O cath if menses (not at [facility])     Status: Abnormal   Collection Time: 12/03/14 11:52 AM  Result Value Ref Range   Color, Urine YELLOW YELLOW   APPearance TURBID (A) CLEAR   Specific Gravity, Urine 1.019 1.005 - 1.030   pH 5.0 5.0 - 8.0   Glucose, UA NEGATIVE NEGATIVE mg/dL   Hgb urine dipstick LARGE (A) NEGATIVE   Bilirubin Urine NEGATIVE NEGATIVE   Ketones, ur NEGATIVE NEGATIVE mg/dL   Protein, ur >300 (A) NEGATIVE mg/dL   Urobilinogen, UA 0.2 0.0 - 1.0 mg/dL   Nitrite NEGATIVE NEGATIVE   Leukocytes, UA MODERATE (A) NEGATIVE  Urine microscopic-add on     Status: Abnormal   Collection Time: 12/03/14 11:52 AM  Result Value Ref Range   Squamous Epithelial / LPF FEW (A) RARE   WBC, UA TOO NUMEROUS TO COUNT <3 WBC/hpf   RBC / HPF 7-10 <3 RBC/hpf   Bacteria, UA MANY (A) RARE   Casts GRANULAR CAST (A) NEGATIVE  CBC with Differential     Status: Abnormal   Collection Time: 12/03/14 12:39 PM  Result Value Ref Range   WBC 12.6 (H) 4.0 - 10.5 K/uL   RBC 3.54 (L) 3.87 - 5.11 MIL/uL   Hemoglobin 10.5 (L) 12.0 - 15.0 g/dL   HCT 29.8 (L) 36.0 - 46.0 %   MCV 84.2 78.0 - 100.0 fL   MCH 29.7 26.0 - 34.0 pg   MCHC 35.2 30.0 - 36.0 g/dL   RDW 13.9 11.5 - 15.5 %   Platelets 335 150 - 400 K/uL   Neutrophils Relative % 90 (H) 43 - 77 %    Neutro Abs 11.5 (H) 1.7 - 7.7 K/uL   Lymphocytes Relative 7 (L) 12 - 46 %   Lymphs Abs 0.9 0.7 - 4.0 K/uL   Monocytes Relative 3 3 - 12 %   Monocytes Absolute 0.4 0.1 - 1.0 K/uL   Eosinophils Relative 0 0 - 5 %   Eosinophils Absolute 0.0 0.0 - 0.7 K/uL   Basophils Relative 0 0 - 1 %   Basophils Absolute 0.0 0.0 - 0.1 K/uL  Basic metabolic panel     Status: Abnormal   Collection Time: 12/03/14 12:39 PM  Result Value Ref Range   Sodium 135 135 - 145 mmol/L   Potassium 5.3 (H) 3.5 - 5.1 mmol/L   Chloride 109 101 - 111 mmol/L   CO2 12 (L) 22 - 32 mmol/L   Glucose, Bld 108 (H) 65 - 99 mg/dL   BUN 124 (H) 6 - 20 mg/dL   Creatinine, Ser 8.65 (H) 0.44 - 1.00 mg/dL   Calcium 8.8 (L) 8.9 - 10.3 mg/dL   GFR calc non Af Amer 5 (L) >60 mL/min   GFR calc Af Amer 6 (L) >60 mL/min    Comment: (NOTE) The eGFR has been calculated using the CKD EPI equation. This calculation has not been validated in all clinical situations. eGFR's persistently <60 mL/min signify possible Chronic Kidney Disease.    Anion gap 14 5 - 15  I-Stat beta hCG blood, ED     Status: None   Collection Time: 12/03/14 12:45 PM  Result Value Ref Range   I-stat hCG, quantitative <5.0 <5 mIU/mL   Comment 3  Comment:   GEST. AGE      CONC.  (mIU/mL)   <=1 WEEK        5 - 50     2 WEEKS       50 - 500     3 WEEKS       100 - 10,000     4 WEEKS     1,000 - 30,000        FEMALE AND NON-PREGNANT FEMALE:     LESS THAN 5 mIU/mL   I-stat Chem 8, ED     Status: Abnormal   Collection Time: 12/03/14 12:47 PM  Result Value Ref Range   Sodium 134 (L) 135 - 145 mmol/L   Potassium 5.2 (H) 3.5 - 5.1 mmol/L   Chloride 110 101 - 111 mmol/L   BUN 131 (H) 6 - 20 mg/dL   Creatinine, Ser 8.90 (H) 0.44 - 1.00 mg/dL   Glucose, Bld 111 (H) 65 - 99 mg/dL   Calcium, Ion 1.19 1.12 - 1.23 mmol/L   TCO2 13 0 - 100 mmol/L   Hemoglobin 10.9 (L) 12.0 - 15.0 g/dL   HCT 32.0 (L) 36.0 - 46.0 %  CBC WITH DIFFERENTIAL     Status: Abnormal    Collection Time: 12/04/14  5:38 AM  Result Value Ref Range   WBC 10.6 (H) 4.0 - 10.5 K/uL   RBC 3.48 (L) 3.87 - 5.11 MIL/uL   Hemoglobin 10.2 (L) 12.0 - 15.0 g/dL   HCT 29.9 (L) 36.0 - 46.0 %   MCV 85.9 78.0 - 100.0 fL   MCH 29.3 26.0 - 34.0 pg   MCHC 34.1 30.0 - 36.0 g/dL   RDW 14.3 11.5 - 15.5 %   Platelets 404 (H) 150 - 400 K/uL   Neutrophils Relative % 97 (H) 43 - 77 %   Neutro Abs 10.3 (H) 1.7 - 7.7 K/uL   Lymphocytes Relative 3 (L) 12 - 46 %   Lymphs Abs 0.3 (L) 0.7 - 4.0 K/uL   Monocytes Relative 0 (L) 3 - 12 %   Monocytes Absolute 0.0 (L) 0.1 - 1.0 K/uL   Eosinophils Relative 0 0 - 5 %   Eosinophils Absolute 0.0 0.0 - 0.7 K/uL   Basophils Relative 0 0 - 1 %   Basophils Absolute 0.0 0.0 - 0.1 K/uL  Comprehensive metabolic panel     Status: Abnormal   Collection Time: 12/04/14  5:38 AM  Result Value Ref Range   Sodium 135 135 - 145 mmol/L   Potassium 5.3 (H) 3.5 - 5.1 mmol/L   Chloride 108 101 - 111 mmol/L   CO2 14 (L) 22 - 32 mmol/L   Glucose, Bld 152 (H) 65 - 99 mg/dL   BUN 124 (H) 6 - 20 mg/dL    Comment: DELTA CHECK NOTED   Creatinine, Ser 8.15 (H) 0.44 - 1.00 mg/dL   Calcium 8.5 (L) 8.9 - 10.3 mg/dL   Total Protein 6.6 6.5 - 8.1 g/dL   Albumin 3.0 (L) 3.5 - 5.0 g/dL   AST 19 15 - 41 U/L   ALT 18 14 - 54 U/L   Alkaline Phosphatase 48 38 - 126 U/L   Total Bilirubin 0.3 0.3 - 1.2 mg/dL   GFR calc non Af Amer 6 (L) >60 mL/min   GFR calc Af Amer 6 (L) >60 mL/min    Comment: (NOTE) The eGFR has been calculated using the CKD EPI equation. This calculation has not been validated in all clinical  situations. eGFR's persistently <60 mL/min signify possible Chronic Kidney Disease.    Anion gap 13 5 - 15     ROS:  A comprehensive review of systems was negative except for: Constitutional: positive for fatigue Gastrointestinal: positive for abdominal pain Genitourinary: positive for dysuria and frequency  Physical Exam: Filed Vitals:   12/04/14 0532  BP:  168/73  Pulse: 88  Temp: 97.7 F (36.5 C)  Resp: 20     General: obese, pleasant BF NAD HEENT: PERRLA, EOMI, mucous membranes moist Neck: no JVD Heart: RRR Lungs: mostly clear Abdomen: obese, tender on right side Extremities: minimal peripheral edema Skin: warm and dry Neuro: alert, non focal   Assessment/Plan: 39 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  Her GFR actually has not changed too much with the change in creatinine from the 6's to the 8's.  It is difficult to tell but I do not feel that she is uremic.  There are no indications for dialysis. The biopsy suggest that this could be reversible so would continue with immune suppressing therapy for now.  It is too soon to tell if her RI will be reversible.  Her recent change in creatinine could be due to her UTI or even the high dose steroids.  I agree with the management to date in treating her UTI.   2. Renal hematoma after biopsy  - per imaging does not seem to be worsening- hgb stable 3. HTN- overall is high- maybe due to steroids.  On amlodipine and toprol- could increase toprol to 50 daily  4. Anemia  - stable and better than where it was immediately after biopsy  5. Hyperkalemia- do not feel like it needs to be treated unless over 5.5 consistently  6. UTI- on rocephin per primary team- culture pending   Thank you for this consult.  I will continue to follow with you   , A 12/04/2014, 11:46 AM

## 2014-12-05 DIAGNOSIS — N186 End stage renal disease: Secondary | ICD-10-CM

## 2014-12-05 DIAGNOSIS — R278 Other lack of coordination: Secondary | ICD-10-CM

## 2014-12-05 DIAGNOSIS — R112 Nausea with vomiting, unspecified: Secondary | ICD-10-CM

## 2014-12-05 DIAGNOSIS — R300 Dysuria: Secondary | ICD-10-CM

## 2014-12-05 DIAGNOSIS — D631 Anemia in chronic kidney disease: Secondary | ICD-10-CM

## 2014-12-05 LAB — BASIC METABOLIC PANEL
Anion gap: 15 (ref 5–15)
BUN: 153 mg/dL — AB (ref 6–20)
CALCIUM: 8.1 mg/dL — AB (ref 8.9–10.3)
CHLORIDE: 109 mmol/L (ref 101–111)
CO2: 12 mmol/L — ABNORMAL LOW (ref 22–32)
CREATININE: 8.22 mg/dL — AB (ref 0.44–1.00)
GFR, EST AFRICAN AMERICAN: 6 mL/min — AB (ref >60)
GFR, EST NON AFRICAN AMERICAN: 6 mL/min — AB (ref >60)
Glucose, Bld: 131 mg/dL — ABNORMAL HIGH (ref 65–99)
Potassium: 4.6 mmol/L (ref 3.5–5.1)
SODIUM: 136 mmol/L (ref 135–145)

## 2014-12-05 LAB — RENAL FUNCTION PANEL
ALBUMIN: 2.9 g/dL — AB (ref 3.5–5.0)
Anion gap: 14 (ref 5–15)
BUN: 148 mg/dL — AB (ref 6–20)
CALCIUM: 8.1 mg/dL — AB (ref 8.9–10.3)
CHLORIDE: 107 mmol/L (ref 101–111)
CO2: 14 mmol/L — ABNORMAL LOW (ref 22–32)
CREATININE: 8.04 mg/dL — AB (ref 0.44–1.00)
GFR, EST AFRICAN AMERICAN: 7 mL/min — AB (ref >60)
GFR, EST NON AFRICAN AMERICAN: 6 mL/min — AB (ref >60)
Glucose, Bld: 157 mg/dL — ABNORMAL HIGH (ref 65–99)
PHOSPHORUS: 10.1 mg/dL — AB (ref 2.5–4.6)
Potassium: 6 mmol/L — ABNORMAL HIGH (ref 3.5–5.1)
SODIUM: 135 mmol/L (ref 135–145)

## 2014-12-05 MED ORDER — SODIUM BICARBONATE 650 MG PO TABS
650.0000 mg | ORAL_TABLET | Freq: Three times a day (TID) | ORAL | Status: DC
  Administered 2014-12-05 – 2014-12-06 (×4): 650 mg via ORAL
  Filled 2014-12-05 (×4): qty 1

## 2014-12-05 MED ORDER — SODIUM BICARBONATE 650 MG PO TABS
650.0000 mg | ORAL_TABLET | Freq: Two times a day (BID) | ORAL | Status: DC
  Administered 2014-12-05: 650 mg via ORAL
  Filled 2014-12-05: qty 1

## 2014-12-05 MED ORDER — DEXTROSE 50 % IV SOLN
1.0000 | Freq: Once | INTRAVENOUS | Status: AC
  Administered 2014-12-05: 50 mL via INTRAVENOUS
  Filled 2014-12-05 (×2): qty 50

## 2014-12-05 MED ORDER — ONDANSETRON 4 MG PO TBDP
4.0000 mg | ORAL_TABLET | Freq: Four times a day (QID) | ORAL | Status: DC | PRN
  Administered 2014-12-05 – 2015-01-03 (×10): 4 mg via ORAL
  Filled 2014-12-05 (×18): qty 1

## 2014-12-05 MED ORDER — SODIUM POLYSTYRENE SULFONATE 15 GM/60ML PO SUSP
30.0000 g | Freq: Once | ORAL | Status: AC
  Administered 2014-12-05: 30 g via ORAL
  Filled 2014-12-05: qty 120

## 2014-12-05 MED ORDER — DEXTROSE 5 % IV SOLN
1.0000 g | INTRAVENOUS | Status: DC
  Administered 2014-12-05: 1 g via INTRAVENOUS
  Filled 2014-12-05 (×2): qty 10

## 2014-12-05 MED ORDER — INSULIN ASPART 100 UNIT/ML ~~LOC~~ SOLN
10.0000 [IU] | Freq: Once | SUBCUTANEOUS | Status: AC
  Administered 2014-12-05: 10 [IU] via SUBCUTANEOUS

## 2014-12-05 MED ORDER — HYDRALAZINE HCL 10 MG PO TABS
10.0000 mg | ORAL_TABLET | Freq: Three times a day (TID) | ORAL | Status: DC
  Administered 2014-12-05 – 2014-12-09 (×10): 10 mg via ORAL
  Filled 2014-12-05 (×10): qty 1

## 2014-12-05 MED ORDER — ACETAMINOPHEN 500 MG PO TABS
500.0000 mg | ORAL_TABLET | Freq: Four times a day (QID) | ORAL | Status: DC | PRN
  Administered 2014-12-16 – 2014-12-24 (×3): 500 mg via ORAL
  Filled 2014-12-05 (×3): qty 1

## 2014-12-05 NOTE — Progress Notes (Signed)
Spoke with MD Rice, patient verbally expressed to [place] that she is very overwhelmed at home and feels chronically depressed. Asked if he could address this in rounds.

## 2014-12-05 NOTE — Progress Notes (Signed)
Subjective: Patient initially held o/n to assess need for tunneled PICC vs IR removal. However, patient clinically worse compared to yesterday's exam. Continues to have dysuria and hematuria, with improving left abdominal and flank pain. Seen on teaching rounds she was actively vomiting gastric contents including mostly her breakfast. Nausea improved with zofran, vomiting did not continue.  Objective: Vital signs in last 24 hours: Filed Vitals:   12/04/14 2209 12/05/14 0500 12/05/14 0548 12/05/14 1312  BP: 179/98  151/78 173/83  Pulse: 99  89 91  Temp:   97.5 F (36.4 C) 97.5 F (36.4 C)  TempSrc:   Oral Oral  Resp:   18 17  Height:      Weight:  113.535 kg (250 lb 4.8 oz)    SpO2:   100% 100%   Weight change: 6.033 kg (13 lb 4.8 oz)  Intake/Output Summary (Last 24 hours) at 12/05/14 1512 Last data filed at 12/05/14 1036  Gross per 24 hour  Intake    130 ml  Output      0 ml  Net    130 ml   GENERAL- alert, orientedx3 NAD HEENT- Atraumatic, oral mucosa appears moist, good and intact dentition CARDIAC- RRR, no murmurs, rubs or gallops. RESP- CTAB, no wheezes or crackles. ABDOMEN- Left sided TTP, no guarding or rebound, normoactive bowel sounds BACK- Left CVA TTP, mildly tender ecchymoses extending along waistline EXTREMITIES- mild asterixis present on exam, no pedal edema SKIN- Warm, dry, No rash or lesion. PSYCH- Patient does have anxiety, but it is associated with concerns over her current medical condition and hospitalization. Degree of anxiety is appropriate, and she is not experiencing episodes of panic. Otherwise cooperative, pleasant, and well acquainted with her current plan of care  Lab Results: Basic Metabolic Panel:  Recent Labs Lab 12/04/14 0538 12/05/14 0442  NA 135 135  K 5.3* 6.0*  CL 108 107  CO2 14* 14*  GLUCOSE 152* 157*  BUN 124* 148*  CREATININE 8.15* 8.04*  CALCIUM 8.5* 8.1*  PHOS  --  10.1*   Liver Function Tests:  Recent Labs Lab  12/04/14 0538 12/05/14 0442  AST 19  --   ALT 18  --   ALKPHOS 48  --   BILITOT 0.3  --   PROT 6.6  --   ALBUMIN 3.0* 2.9*   No results for input(s): LIPASE, AMYLASE in the last 168 hours. No results for input(s): AMMONIA in the last 168 hours. CBC:  Recent Labs Lab 12/03/14 1239 12/03/14 1247 12/04/14 0538  WBC 12.6*  --  10.6*  NEUTROABS 11.5*  --  10.3*  HGB 10.5* 10.9* 10.2*  HCT 29.8* 32.0* 29.9*  MCV 84.2  --  85.9  PLT 335  --  404*   Cardiac Enzymes: No results for input(s): CKTOTAL, CKMB, CKMBINDEX, TROPONINI in the last 168 hours. BNP: No results for input(s): PROBNP in the last 168 hours. D-Dimer: No results for input(s): DDIMER in the last 168 hours. CBG: No results for input(s): GLUCAP in the last 168 hours. Hemoglobin A1C: No results for input(s): HGBA1C in the last 168 hours. Fasting Lipid Panel: No results for input(s): CHOL, HDL, LDLCALC, TRIG, CHOLHDL, LDLDIRECT in the last 168 hours. Thyroid Function Tests: No results for input(s): TSH, T4TOTAL, FREET4, T3FREE, THYROIDAB in the last 168 hours. Coagulation: No results for input(s): LABPROT, INR in the last 168 hours. Anemia Panel: No results for input(s): VITAMINB12, FOLATE, FERRITIN, TIBC, IRON, RETICCTPCT in the last 168 hours. Urine Drug Screen:  Drugs of Abuse     Component Value Date/Time   LABOPIA NONE DETECTED 07/21/2012 0215   COCAINSCRNUR NONE DETECTED 07/21/2012 0215   LABBENZ NONE DETECTED 07/21/2012 0215   AMPHETMU NONE DETECTED 07/21/2012 0215   THCU NONE DETECTED 07/21/2012 0215   LABBARB NONE DETECTED 07/21/2012 0215    Alcohol Level: No results for input(s): ETH in the last 168 hours. Urinalysis:  Recent Labs Lab 12/03/14 1152  COLORURINE YELLOW  LABSPEC 1.019  PHURINE 5.0  GLUCOSEU NEGATIVE  HGBUR LARGE*  BILIRUBINUR NEGATIVE  KETONESUR NEGATIVE  PROTEINUR >300*  UROBILINOGEN 0.2  NITRITE NEGATIVE  LEUKOCYTESUR MODERATE*    Micro Results: Recent Results  (from the past 240 hour(s))  Urine culture     Status: None (Preliminary result)   Collection Time: 12/03/14  1:44 PM  Result Value Ref Range Status   Specimen Description URINE, CLEAN CATCH  Final   Special Requests Normal  Final   Culture >=100,000 COLONIES/mL GRAM NEGATIVE RODS  Final   Report Status PENDING  Incomplete   Studies/Results: No results found. Medications: I have reviewed the patient's current medications. Scheduled Meds: . amLODipine  10 mg Oral BID  . atorvastatin  20 mg Oral q1800  . cefTRIAXone (ROCEPHIN)  IV  1 g Intravenous Q24H  . famotidine  40 mg Oral Daily  . heparin  5,000 Units Subcutaneous 3 times per day  . hydrALAZINE  10 mg Oral 3 times per day  . metoprolol succinate  50 mg Oral Daily  . mycophenolate  1,500 mg Oral BID  . predniSONE  20 mg Oral TID  . sodium bicarbonate  650 mg Oral BID  . sodium chloride  3 mL Intravenous Q12H   Continuous Infusions:  PRN Meds:.acetaminophen, cyclobenzaprine, ondansetron, oxyCODONE-acetaminophen, sodium chloride Assessment/Plan: Acute on chronic renal insufficiency 2/2 lupus nephritis and UTI: Chemistry panel worse at Cr 8.04, CO2 14, BUN 148, K 6.0. Patient still receiving treatment most with immunosuppression and antibiotics for UTI, however worsening symptomatic metabolic derangement is unstable and will require additional inpatient management. Asterixis, vomiting may be early uremic symptoms. - No acute indications for hemodialysis at this time - Continue ceftriaxone 1g q24hrs (day 3) - Continue prednisone 20mg  PO TID - Continue Cellcept 1500mg  PO BID - Start zofran 4mg  ODT q6hrs PRN - Repeat AM renal function panel, CBC - Nephrology consulted, following pt and recs appreciated  Hyperkalemia: K 6.0 up from 5.3. EKG repeated showing no new changes. - Cardiac monitoring - Kayexalate 30g PO - Novolog 10U [place] once + IV dextrose - Start sodium bicarbonate 650mg  PO BID - Repeat Bmet today PM (8  hrs)  HTN: SBPs 170s-180s at admission. Goal for SBP control during lupus nephritis treatment should be <140. Only on CCB and BB. - Amlodipine 10mg  PO BID - Metoprolol succinate to 50mg  PO daily - Start hydralazine 10mg  PO TID, consider change to IV if nausea prevents PO  Back and flank pain: Pain present since renal biopsy. - Percocet 5-325mg  q4hrs PRN - Flexeril 10mg  qHS PRN  SVT: s/p ablation 2014, has some recurrent palpitations in recent months, currently holter monitor on board. AOCD: Current Hgb 10.2 stable. HLD: Continue home simvastatin 40mg  PO  FEN: Renal diet, fluid restricted DVT ppx: [place] heparin FULL CODE  Dispo: Disposition deferred until improvement in clinical condition.  The patient does have a current PCP Lin Landsman, MD) and does not need an [facility] hospital follow-up appointment after discharge.  The patient does not have transportation limitations that  hinder transportation to clinic appointments.   LOS: 2 days   Collier Salina, MD 12/05/2014, 3:12 PM

## 2014-12-05 NOTE — Progress Notes (Signed)
Subjective:  BP has been high- creatinine went down but bun up and K is up as well.  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 Her GFR actually has not changed too much with the change in creatinine from the 6's to the 8's. It is difficult to tell but I do not feel that she is uremic. There are no indications for dialysis. The biopsy suggest that this could be reversible so would continue with immune suppressing therapy for now. It is too soon to tell if her RI will be reversible. Her recent change in creatinine could be due to her UTI or even the high dose steroids. I agree with the management to date in treating her UTI.  2. Renal hematoma after biopsy - per imaging does not seem to be worsening- hgb stable 3. HTN- overall is high- maybe due to steroids. On amlodipine and toprol-  toprol increased to 50 daily.  If cont to be high would start hydralazine 4. Anemia - stable and better than where it was immediately after biopsy  5. Hyperkalemia- on low K diet- also given kaexylate today with a recheck later.  I will also start sodium  bicarb to shift the K 6. UTI- on cipro per primary team- culture pending  7. Metabolic acidosis- will start PO sodium bicarb     Audery Wassenaar A    Labs: Basic Metabolic Panel:  Recent Labs Lab 12/03/14 1239 12/03/14 1247 12/04/14 0538 12/05/14 0442  NA 135 134* 135 135  K 5.3* 5.2* 5.3* 6.0*  CL 109 110 108 107  CO2 12*  --  14* 14*  GLUCOSE 108* 111* 152* 157*  BUN 124* 131* 124* 148*  CREATININE 8.65* 8.90* 8.15* 8.04*  CALCIUM 8.8*  --  8.5* 8.1*  PHOS  --   --   --  10.1*   Liver Function Tests:  Recent Labs Lab 12/04/14 0538 12/05/14 0442  AST 19  --   ALT 18  --   ALKPHOS 48  --   BILITOT 0.3  --   PROT 6.6  --   ALBUMIN 3.0* 2.9*   No results for input(s): LIPASE, AMYLASE in the last 168 hours. No results for input(s): AMMONIA in the last 168 hours. CBC:  Recent Labs Lab 12/03/14 1239 12/03/14 1247 12/04/14 0538  WBC 12.6*  --  10.6*  NEUTROABS 11.5*  --  10.3*  HGB 10.5* 10.9* 10.2*  HCT 29.8* 32.0* 29.9*  MCV 84.2  --  85.9  PLT 335  --  404*   Cardiac Enzymes: No results for input(s): CKTOTAL, CKMB, CKMBINDEX, TROPONINI in the last 168 hours. CBG: No results for input(s): GLUCAP in the last 168 hours.  Iron Studies: No results for input(s): IRON, TIBC, TRANSFERRIN, FERRITIN in the last 72 hours. Studies/Results: Ct Abdomen Pelvis Wo Contrast  12/03/2014   CLINICAL DATA:  39 year old female with left-sided flank pain following renal biopsy. Pain with urination. Hematuria. Left renal biopsy performed on 09/03/2014, and right renal biopsy performed on 11/22/2014.  EXAM: CT ABDOMEN AND PELVIS WITHOUT CONTRAST  TECHNIQUE: Multidetector CT imaging of the abdomen and pelvis was performed following the standard protocol without IV contrast.  COMPARISON:  CT the abdomen and pelvis 11/26/2014.  FINDINGS: Lower chest: Central venous catheter terminating in the right atrium. Minimal bibasilar subsegmental atelectasis. Mild cardiomegaly.   Hepatobiliary: No discrete cystic or solid hepatic lesion identified on today's noncontrast CT examination. The unenhanced appearance of the gallbladder is normal.  Pancreas: No definite pancreatic mass or peripancreatic inflammatory changes on today's noncontrast CT examination.  Spleen: Unremarkable.  Adrenals/Urinary Tract: Small amount of subcapsular high attenuation fluid and gas in the lower pole of the right kidney posteriorly, similar to the prior examination from 11/26/2014, measuring up to 3.9 x 1.7 cm on today's examination. The unenhanced appearance of the left kidney is unremarkable. No calcifications identified within the collecting system of either kidney, along the course of either ureter or within the lumen of the urinary bladder. Bilateral perinephric stranding and periureteric stranding is similar to prior examination. Bilateral adrenal glands are normal in appearance. Urinary bladder is unremarkable in appearance.  Stomach/Bowel: The unenhanced appearance of the stomach is normal. No pathologic dilatation of small bowel or colon. Mild colonic wall thickening involving the cecum, ascending colon and proximal transverse colon, which may suggest a mild colitis. Status post appendectomy.  Vascular/Lymphatic: No atherosclerotic calcifications or definite aneurysm identified in the abdominal or pelvic vasculature. Numerous prominent but nonenlarged retroperitoneal lymph nodes are similar to the prior study, presumably reactive. No lymphadenopathy noted elsewhere in the abdomen or pelvis.  Reproductive: Uterus and bilateral ovaries are unremarkable in appearance.  Other: Small amount of high attenuation fluid and gas again noted in the left retroperitoneum posterior to the left kidney, tracking down toward the anatomic pelvis. This is very similar to the prior study, measuring up to 4.8 x 1.6 cm on today's examination. Trace volume of ascites. Small volume of retroperitoneal fluid, similar to the prior  examination. No pneumoperitoneum. Diffuse body wall edema.  Musculoskeletal: There are no aggressive appearing lytic or blastic lesions noted in the visualized portions of the skeleton.  IMPRESSION: 1. 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 These both contain small locules of gas, also similar to the prior examination. No findings to suggest recurrent hemorrhage or overt findings to suggest infection of either of these collections at this time. 2. Thickening of the cecum, ascending colon and proximal transverse colon, suspicious for potential colitis. Clinical correlation is recommended. 3. Chronic bilateral perinephric and periureteric stranding, similar to the prior examination, potentially related to lupus nephritis. 4. Trace volume of ascites and diffuse body wall edema. 5. Mild cardiomegaly. 6. Additional incidental findings, as above.   Electronically Signed   By: Vinnie Langton M.D.   On: 12/03/2014 14:00  Medications: Infusions:    Scheduled Medications: . amLODipine  10 mg Oral BID  . atorvastatin  20 mg Oral q1800  . ciprofloxacin  250 mg Oral BID  . famotidine  40 mg Oral Daily  . heparin  5,000 Units Subcutaneous 3 times per day  . metoprolol succinate  50 mg Oral Daily  . mycophenolate  1,500 mg Oral BID  . predniSONE  20 mg Oral TID  . sodium bicarbonate  650 mg Oral BID  . sodium chloride  3 mL Intravenous Q12H  . sodium polystyrene  30 g Oral Once    have reviewed scheduled and prn medications.  Physical Exam: General: NAD Heart: RRR Lungs: clear Abdomen: soft, non tender Extremities: no significant edema    12/05/2014,9:56 AM  LOS: 2 days

## 2014-12-06 ENCOUNTER — Encounter (HOSPITAL_COMMUNITY): Payer: Self-pay

## 2014-12-06 ENCOUNTER — Encounter: Payer: Medicaid Other | Admitting: Obstetrics & Gynecology

## 2014-12-06 LAB — CBC WITH DIFFERENTIAL/PLATELET
BASOS PCT: 0 % (ref 0–1)
Basophils Absolute: 0 10*3/uL (ref 0.0–0.1)
EOS ABS: 0 10*3/uL (ref 0.0–0.7)
EOS PCT: 0 % (ref 0–5)
HCT: 28.3 % — ABNORMAL LOW (ref 36.0–46.0)
HEMOGLOBIN: 9.5 g/dL — AB (ref 12.0–15.0)
Lymphocytes Relative: 2 % — ABNORMAL LOW (ref 12–46)
Lymphs Abs: 0.3 10*3/uL — ABNORMAL LOW (ref 0.7–4.0)
MCH: 28.3 pg (ref 26.0–34.0)
MCHC: 33.6 g/dL (ref 30.0–36.0)
MCV: 84.2 fL (ref 78.0–100.0)
MONOS PCT: 1 % — AB (ref 3–12)
Monocytes Absolute: 0.2 10*3/uL (ref 0.1–1.0)
NEUTROS PCT: 97 % — AB (ref 43–77)
Neutro Abs: 12.2 10*3/uL — ABNORMAL HIGH (ref 1.7–7.7)
PLATELETS: 413 10*3/uL — AB (ref 150–400)
RBC: 3.36 MIL/uL — ABNORMAL LOW (ref 3.87–5.11)
RDW: 14.3 % (ref 11.5–15.5)
WBC: 12.7 10*3/uL — AB (ref 4.0–10.5)

## 2014-12-06 LAB — RENAL FUNCTION PANEL
Albumin: 3 g/dL — ABNORMAL LOW (ref 3.5–5.0)
Anion gap: 16 — ABNORMAL HIGH (ref 5–15)
BUN: 154 mg/dL — AB (ref 6–20)
CALCIUM: 8 mg/dL — AB (ref 8.9–10.3)
CO2: 13 mmol/L — AB (ref 22–32)
CREATININE: 8.08 mg/dL — AB (ref 0.44–1.00)
Chloride: 105 mmol/L (ref 101–111)
GFR, EST AFRICAN AMERICAN: 7 mL/min — AB (ref >60)
GFR, EST NON AFRICAN AMERICAN: 6 mL/min — AB (ref >60)
Glucose, Bld: 155 mg/dL — ABNORMAL HIGH (ref 65–99)
Phosphorus: 11 mg/dL — ABNORMAL HIGH (ref 2.5–4.6)
Potassium: 4.6 mmol/L (ref 3.5–5.1)
SODIUM: 134 mmol/L — AB (ref 135–145)

## 2014-12-06 LAB — URINE CULTURE: SPECIAL REQUESTS: NORMAL

## 2014-12-06 LAB — PROTIME-INR
INR: 1.05 (ref 0.00–1.49)
PROTHROMBIN TIME: 13.9 s (ref 11.6–15.2)

## 2014-12-06 LAB — APTT: aPTT: 26 seconds (ref 24–37)

## 2014-12-06 MED ORDER — CEPHALEXIN 250 MG PO CAPS
250.0000 mg | ORAL_CAPSULE | Freq: Two times a day (BID) | ORAL | Status: AC
  Administered 2014-12-06 – 2014-12-09 (×7): 250 mg via ORAL
  Filled 2014-12-06 (×10): qty 1

## 2014-12-06 MED ORDER — CALCIUM ACETATE (PHOS BINDER) 667 MG PO CAPS
1334.0000 mg | ORAL_CAPSULE | Freq: Three times a day (TID) | ORAL | Status: DC
  Administered 2014-12-08 – 2015-01-05 (×62): 1334 mg via ORAL
  Filled 2014-12-06 (×66): qty 2

## 2014-12-06 MED ORDER — CEFAZOLIN SODIUM-DEXTROSE 2-3 GM-% IV SOLR
2.0000 g | INTRAVENOUS | Status: AC
  Administered 2014-12-07: 2 g via INTRAVENOUS
  Filled 2014-12-06: qty 50

## 2014-12-06 MED ORDER — OXYCODONE-ACETAMINOPHEN 5-325 MG PO TABS
1.0000 | ORAL_TABLET | Freq: Four times a day (QID) | ORAL | Status: DC | PRN
  Administered 2014-12-06 – 2015-01-04 (×21): 1 via ORAL
  Filled 2014-12-06 (×24): qty 1

## 2014-12-06 MED ORDER — HEPARIN SODIUM (PORCINE) 5000 UNIT/ML IJ SOLN
5000.0000 [IU] | Freq: Three times a day (TID) | INTRAMUSCULAR | Status: DC
  Administered 2014-12-06 – 2014-12-07 (×3): 5000 [IU] via SUBCUTANEOUS
  Filled 2014-12-06 (×3): qty 1

## 2014-12-06 NOTE — Consult Note (Signed)
Chief Complaint: Patient was seen in consultation today for worsening renal function Chief Complaint  Patient presents with  . Hematuria   at the request of Dr Moshe Cipro  Referring Physician(s): Dr Vanetta Mulders  History of Present Illness: [name] is a 39 y.o. 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 Hypertension   . Cardiac arrhythmia   . SVT (supraventricular tachycardia)     "today, last week, 2 wk ago; maybe 1 month ago, etc; started w/in last 3-4 yrs"(07/20/2012)  . Fainting     "~ 1 month ago; probably related to SVT" (07/20/2012)  . Heart murmur     "small" (07/20/2012)  . Shortness of breath     "related to SVT episodes" (07/20/2012)  . Anemia     "today" (07/20/2012)  . Chest pain at rest     "related to SVT" (07/20/2012)  . Sleep apnea     "don't wear mask" (07/20/2012)  . Daily headache   . Anxiety   . Lupus (systemic lupus erythematosus)   . Renal disorder   . Chronic renal failure, stage 4 (severe)   . GERD (gastroesophageal reflux disease)   . Fibromyalgia   . Irritable bowel syndrome (IBS)     Past Surgical History  Procedure Laterality Date  . Appendectomy  2011  . Tubal ligation  2010  . Cesarean section  2001; 2010  . Cervical biopsy  2013  . Ablation      07/21/12 Dr. Cristopher Peru     Allergies: Food  Medications: Prior to Admission medications   Medication Sig Start Date End Date Taking? Authorizing Provider  acetaminophen (TYLENOL) 500 MG tablet Take 1,000 mg by mouth every 6 (six) hours as needed (pain).    Yes Historical Provider, MD  alendronate (FOSAMAX) 70 MG tablet Take 70 mg by mouth once a week. Take on Wednesdays with a full glass of water on an empty stomach.   Yes Historical Provider, MD  amLODipine (NORVASC) 10  MG tablet Take 1 tablet (10 mg total) by mouth daily. Patient taking differently: Take 10 mg by mouth 2 (two) times daily.  11/15/12  Yes Oswald Hillock, MD  Calcium Carbonate Antacid (TUMS PO) Take 1-2 tablets by mouth daily as needed (heartburn).   Yes Historical Provider, MD  cyclobenzaprine (FLEXERIL) 10 MG tablet Take 10 mg by mouth at bedtime as needed for muscle spasms.    Yes Historical Provider, MD  famotidine (PEPCID) 40 MG tablet Take 1 tablet (40 mg total) by mouth daily. 11/13/14  Yes Etta Quill, NP  mycophenolate (CELLCEPT) 500 MG tablet Take 1,500 mg by mouth 2 (two) times daily.    Yes Historical Provider, MD  ondansetron (ZOFRAN ODT) 4 MG disintegrating tablet 75m ODT q4 hours prn nausea/vomit 10/22/14  Yes Robyn M Hess, PA-C  oxyCODONE-acetaminophen (PERCOCET) 5-325 MG per tablet Take 1 tablet by mouth every 4 (four) hours as needed for severe pain. 11/26/14  Yes EDaleen Bo MD  predniSONE (DELTASONE) 20 MG tablet Take 20 mg by mouth 3 (three) times daily.    Yes Historical Provider, MD  simvastatin (ZOCOR) 40 MG tablet Take 40 mg by mouth daily.   Yes Historical Provider, MD  atorvastatin (LIPITOR) 20 MG tablet Take 1 tablet (20 mg total) by mouth daily at 6 PM. 12/04/14   CCollier Salina  MD  ciprofloxacin (CIPRO) 250 MG tablet Take 1 tablet (250 mg total) by mouth 2 (two) times daily. 12/04/14   Collier Salina, MD  metoprolol succinate (TOPROL-XL) 50 MG 24 hr tablet Take 1 tablet (50 mg total) by mouth daily. 12/04/14   Collier Salina, MD     Family History  Problem Relation Age of Onset  . Cancer Mother     breast and ovarian  . BRCA 1/2 Sister     Social History   Social History  . Marital Status: Single    Spouse Name: N/A  . Number of Children: N/A  . Years of Education: N/A   Social History Main Topics  . Smoking status: Never Smoker   . Smokeless tobacco: Never Used  . Alcohol Use: No     Comment: 07/20/2012 "might have a beer once/yr"  . Drug Use: No    . Sexual Activity: Not Currently    Birth Control/ Protection: None   Other Topics Concern  . None   Social History Narrative    Review of Systems: A 12 point ROS discussed and pertinent positives are indicated in the HPI above.  All other systems are negative.  Review of Systems  Constitutional: Negative for fever, chills and activity change.  Respiratory: Positive for shortness of breath.   Cardiovascular: Negative for chest pain.  Neurological: Positive for weakness.  Psychiatric/Behavioral: Negative for behavioral problems and confusion.    Vital Signs: BP 168/87 mmHg  Pulse 95  Temp(Src) 98.1 F (36.7 C) (Oral)  Resp 17  Ht 5' 3"  (1.6 m)  Wt 252 lb 4.8 oz (114.443 kg)  BMI 44.70 kg/m2  SpO2 100%  LMP 11/17/2014 (Approximate)  Physical Exam  Constitutional: She is oriented to person, place, and time.  Cardiovascular: Normal rate and regular rhythm.   No murmur heard. Pulmonary/Chest: Effort normal and breath sounds normal. She has no wheezes.  Abdominal: Soft. Bowel sounds are normal. There is no tenderness.  Musculoskeletal: Normal range of motion.  Neurological: She is alert and oriented to person, place, and time.  Skin: Skin is warm and dry.  Psychiatric: She has a normal mood and affect. Her behavior is normal. Judgment and thought content normal.  Nursing note and vitals reviewed.   Mallampati Score:  MD Evaluation Airway: WNL Heart: WNL Abdomen: WNL Chest/ Lungs: WNL ASA  Classification: 3 Mallampati/Airway Score: Two  Imaging: Ct Abdomen Pelvis Wo Contrast  12/03/2014   CLINICAL DATA:  39 year old female with left-sided flank pain following renal biopsy. Pain with urination. Hematuria. Left renal biopsy performed on 09/03/2014, and right renal biopsy performed on 11/22/2014.  EXAM: CT ABDOMEN AND PELVIS WITHOUT CONTRAST  TECHNIQUE: Multidetector CT imaging of the abdomen and pelvis was performed following the standard protocol without IV contrast.   COMPARISON:  CT the abdomen and pelvis 11/26/2014.  FINDINGS: Lower chest: Central venous catheter terminating in the right atrium. Minimal bibasilar subsegmental atelectasis. Mild cardiomegaly.  Hepatobiliary: No discrete cystic or solid hepatic lesion identified on today's noncontrast CT examination. The unenhanced appearance of the gallbladder is normal.  Pancreas: No definite pancreatic mass or peripancreatic inflammatory changes on today's noncontrast CT examination.  Spleen: Unremarkable.  Adrenals/Urinary Tract: Small amount of subcapsular high attenuation fluid and gas in the lower pole of the right kidney posteriorly, similar to the prior examination from 11/26/2014, measuring up to 3.9 x 1.7 cm on today's examination. The unenhanced appearance of the left kidney is unremarkable. No calcifications identified within the collecting  system of either kidney, along the course of either ureter or within the lumen of the urinary bladder. Bilateral perinephric stranding and periureteric stranding is similar to prior examination. Bilateral adrenal glands are normal in appearance. Urinary bladder is unremarkable in appearance.  Stomach/Bowel: The unenhanced appearance of the stomach is normal. No pathologic dilatation of small bowel or colon. Mild colonic wall thickening involving the cecum, ascending colon and proximal transverse colon, which may suggest a mild colitis. Status post appendectomy.  Vascular/Lymphatic: No atherosclerotic calcifications or definite aneurysm identified in the abdominal or pelvic vasculature. Numerous prominent but nonenlarged retroperitoneal lymph nodes are similar to the prior study, presumably reactive. No lymphadenopathy noted elsewhere in the abdomen or pelvis.  Reproductive: Uterus and bilateral ovaries are unremarkable in appearance.  Other: Small amount of high attenuation fluid and gas again noted in the left retroperitoneum posterior to the left kidney, tracking down toward the  anatomic pelvis. This is very similar to the prior study, measuring up to 4.8 x 1.6 cm on today's examination. Trace volume of ascites. Small volume of retroperitoneal fluid, similar to the prior examination. No pneumoperitoneum. Diffuse body wall edema.  Musculoskeletal: There are no aggressive appearing lytic or blastic lesions noted in the visualized portions of the skeleton.  IMPRESSION: 1. 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 These both contain small locules of gas, also similar to the prior examination. No findings to suggest recurrent hemorrhage or overt findings to suggest infection of either of these collections at this time. 2. Thickening of the cecum, ascending colon and proximal transverse colon, suspicious for potential colitis. Clinical correlation is recommended. 3. Chronic bilateral perinephric and periureteric stranding, similar to the prior examination, potentially related to lupus nephritis. 4. Trace volume of ascites and diffuse body wall edema. 5. Mild cardiomegaly. 6. Additional incidental findings, as above.   Electronically Signed   By: Vinnie Langton M.D.   On: 12/03/2014 14:00   Ct Abdomen Pelvis Wo Contrast  11/26/2014   CLINICAL DATA:  Left flank pain. History of right renal biopsy 11/22/2014 and left renal biopsy 09/03/2014. Pathology result is not available at this time. History of stage 3 chronic kidney disease.  EXAM: CT ABDOMEN AND PELVIS WITHOUT CONTRAST  TECHNIQUE: Multidetector CT imaging of the abdomen and pelvis was performed following the standard protocol without IV contrast.  COMPARISON:  10/21/2014 abdominal ultrasound, CT abdomen/ pelvis 09/08/2009.  FINDINGS: Lower chest: Mild cardiomegaly. Minimal subpleural dependent scarring or atelectasis. Lung bases are clear. Small pericardial effusion.  Hepatobiliary: Mild image degradation  due to noise from patient's body habitus. Unenhanced liver and gallbladder appear normal.  Pancreas: Normal  Spleen: Normal  Adrenals/Urinary Tract: Adrenal glands are normal. Moderate perinephric stranding/ fluid bilaterally. There is a small posteroinferior right renal perinephric hematoma with a few foci of internal gas likely related to recent procedure. This hematoma measures 3.8 x 1.5 cm image 42. Internal high-density is compatible with blood product. No hydroureteronephrosis. No radiopaque renal, ureteral, or bladder calculus. A small amount of retroperitoneal fluid tracks inferiorly to the pelvis. There is also an ovoid fluid density left retroperitoneal 4.6 x 1.5 cm collection with internal gas likely related to remote left renal biopsy 09/03/2014. Left posterior flank subcutaneous stranding, some of which demonstrate high density which could indicate trace blood product, is identified without subjacent rib fracture seen.  Stomach/Bowel: Surgical sutures likely indicate previous appendectomy at  the base of the cecum. No bowel wall thickening or focal segmental dilatation is identified. Stomach is normal.  Vascular/Lymphatic: Prominent retroperitoneal nodes are identified, largest 1.2 cm short axis diameter image 42. No aortic aneurysm.  Other: Mild anasarca noted. Focal stranding extending to the skin surface within the right anterior abdominal wall on image 36 likely indicates location of previous surgery.  No free air.  Uterus and ovaries appear normal.  Musculoskeletal: No acute osseous abnormality.  IMPRESSION: Small right perinephric hemorrhage with gas likely related to recent renal biopsy.  Probable subacute chronic left retroperitoneal low-density fluid/gas collection likely related to prior biopsy.  Overlying the left posterior flank is subcutaneous stranding with internal relative high density could indicate recent trauma if the patient has fallen. No subjacent rib fracture is identified but  cartilaginous fracture involving the ribs may be radiographically occult.  These results were called by telephone at the time of interpretation on 11/26/2014 at 4:39 pm to Dr. Daleen Bo , who verbally acknowledged these results.   Electronically Signed   By: Conchita Paris M.D.   On: 11/26/2014 16:42   Dg Chest 2 View  11/13/2014   CLINICAL DATA:  Chest pain and cardiac palpitations  EXAM: CHEST  2 VIEW  COMPARISON:  June 29, 2014  FINDINGS: There is no edema or consolidation. The heart size and pulmonary vascularity are normal. No adenopathy. No bone lesions.  IMPRESSION: No edema or consolidation.   Electronically Signed   By: Lowella Grip III M.D.   On: 11/13/2014 11:02   US Transvaginal Non-ob  11/18/2014   CLINICAL DATA:  Patient with heavy vaginal bleeding.  EXAM: TRANSABDOMINAL AND TRANSVAGINAL ULTRASOUND OF PELVIS  TECHNIQUE: Both transabdominal and transvaginal ultrasound examinations of the pelvis were performed. Transabdominal technique was performed for global imaging of the pelvis including uterus, ovaries, adnexal regions, and pelvic cul-de-sac. It was necessary to proceed with endovaginal exam following the transabdominal exam to visualize the endometrium.  COMPARISON:  None  FINDINGS: Uterus  Measurements: 7.4 x 4.7 x 4.5 cm. Within the posterior uterine body there is a 1.1 x 1.2 x 1.1 cm hypoechoic mass most compatible with intramural fibroid.  Endometrium  Thickness: 6 mm.  No focal abnormality visualized.  Right ovary  Measurements: 3.5 x 1.6 x 2.8 cm. Multiple follicles.  Left ovary  Measurements: 2.8 x 2.1 x 2.6 cm. Multiple follicles.  Other findings  No free fluid.  IMPRESSION: Endometrium measures 6 mm. If bleeding remains unresponsive to hormonal or medical therapy, sonohysterogram should be considered for focal lesion work-up. (Ref: Radiological Reasoning: Algorithmic Workup of Abnormal Vaginal Bleeding with Endovaginal Sonography and Sonohysterography. AJR 2008; 732:K02-54)   Fibroid uterus.   Electronically Signed   By: Lovey Newcomer M.D.   On: 11/18/2014 14:14   US Pelvis Complete  11/18/2014   CLINICAL DATA:  Patient with heavy vaginal bleeding.  EXAM: TRANSABDOMINAL AND TRANSVAGINAL ULTRASOUND OF PELVIS  TECHNIQUE: Both transabdominal and transvaginal ultrasound examinations of the pelvis were performed. Transabdominal technique was performed for global imaging of the pelvis including uterus, ovaries, adnexal regions, and pelvic cul-de-sac. It was necessary to proceed with endovaginal exam following the transabdominal exam to visualize the endometrium.  COMPARISON:  None  FINDINGS: Uterus  Measurements: 7.4 x 4.7 x 4.5 cm. Within the posterior uterine body there is a 1.1 x 1.2 x 1.1 cm hypoechoic mass most compatible with intramural fibroid.  Endometrium  Thickness: 6 mm.  No focal abnormality visualized.  Right ovary  Measurements:  3.5 x 1.6 x 2.8 cm. Multiple follicles.  Left ovary  Measurements: 2.8 x 2.1 x 2.6 cm. Multiple follicles.  Other findings  No free fluid.  IMPRESSION: Endometrium measures 6 mm. If bleeding remains unresponsive to hormonal or medical therapy, sonohysterogram should be considered for focal lesion work-up. (Ref: Radiological Reasoning: Algorithmic Workup of Abnormal Vaginal Bleeding with Endovaginal Sonography and Sonohysterography. AJR 2008; 268:T41-96)  Fibroid uterus.   Electronically Signed   By: Lovey Newcomer M.D.   On: 11/18/2014 14:14   Nm Pulmonary Perf And Vent  11/13/2014   CLINICAL DATA:  Shortness of breath. Tachycardia. Renal failure. Lupus.  EXAM: NUCLEAR MEDICINE VENTILATION - PERFUSION LUNG SCAN  TECHNIQUE: Ventilation images were obtained in multiple projections using inhaled aerosol Tc-38mDTPA. Perfusion images were obtained in multiple projections after intravenous injection of Tc-945mAA.  RADIOPHARMACEUTICALS:  Forty Technetium-9954mPA aerosol inhalation and 6 Technetium-51m46m IV  COMPARISON:  11/13/2014 chest radiograph   FINDINGS: Ventilation: No focal ventilation defect.  Perfusion: No wedge shaped peripheral perfusion defects to suggest acute pulmonary embolism.  IMPRESSION: 1. Normal ventilation perfusion lung scan, without findings to indicate pulmonary embolus.   Electronically Signed   By: WaltVan Clines.   On: 11/13/2014 18:11   Us BKoreapsy  11/22/2014   INDICATION: 38 y40r old female with a history of renal disease.  Prior biopsy 09/03/2014  EXAM: ULTRASOUND GUIDED RENAL BIOPSY  COMPARISON:  09/03/2014  MEDICATIONS: Fentanyl 100 mcg IV; Versed 3.0 mg IV  ANESTHESIA/SEDATION: Total Moderate Sedation time  Fifty minutes  COMPLICATIONS: None  PROCEDURE: Informed written consent was obtained from the patient after a discussion of the risks, benefits and alternatives to treatment. The patient understands and consents the procedure. A timeout was performed prior to the initiation of the procedure.  Ultrasound scanning was performed of the bilateral flanks. The inferior pole of the left kidney was first selected for biopsy due to location and sonographic window. The procedure was planned. The operative site was prepped and draped in the usual sterile fashion. The overlying soft tissues were anesthetized with 1% lidocaine with epinephrine. A 15 gauge guide needle was advanced into the retroperitoneum. Soft tissue fluid of the overlying abdominal wall musculature obscured observation of the needle tip within the retroperitoneum. Right kidney was then selected for biopsy.  The right flank operative site was prepped and draped in the usual sterile fashion. The overlying soft tissues were anesthetized with 1% lidocaine with epinephrine. A 15 gauge guide needle was advanced into the right-sided retroperitoneum. Images were saved for documentation purposes.  The 16 gauge biopsy device was then inserted through the guide needle, and 2 separate 16 gauge biopsy were retrieved of the inferior pole of the right kidney.  Post  procedural scanning was negative for significant post procedural hemorrhage or additional complication. A dressing was placed. The patient tolerated the procedure well without immediate post procedural complication.  IMPRESSION: Status post ultrasound-guided biopsy of right kidney for medical renal biopsy. Tissue specimen sent to pathology for complete histopathologic analysis.  Signed,  JaimDulcy FannygnEarleen Newport  [facility]  [facility]   Electronically Signed   By: JaimCorrie Mckusick.   On: 11/22/2014 18:28   Ir Fluoro Guide Cv Line Right  11/29/2014   INDICATION: History of chronic renal insufficiency. Poor venous access. In need of intravenous access for blood transfusion and steroid administration. History of chronic renal insufficiency. As such, request made for placement of a tunneled PICC line.  EXAM: TUNNELED CENTRAL VENOUS HEMODIALYSIS CATHETER PLACEMENT WITH ULTRASOUND AND FLUOROSCOPIC GUIDANCE  MEDICATIONS: None  CONTRAST:  None  ANESTHESIA/SEDATION: None  FLUOROSCOPY TIME:  18 seconds (9.4 mGy)  COMPLICATIONS: None immediate  PROCEDURE: Informed written consent was obtained from the patient after a discussion of the risks, benefits, and alternatives to treatment. Questions regarding the procedure were encouraged and answered. The right neck and chest were prepped with chlorhexidine in a sterile fashion, and a sterile drape was applied covering the operative field. Maximum barrier sterile technique with sterile gowns and gloves were used for the procedure. A timeout was performed prior to the initiation of the procedure.  After creating a small venotomy incision, a micropuncture kit was utilized to access the right internal jugular vein under direct, real-time ultrasound guidance after the overlying soft tissues were anesthetized with 1% lidocaine with epinephrine. Ultrasound image documentation was performed. A peel-away sheath was placed and the microwire was  kinked to measure appropriate catheter length. A tunneled PICC measuring 24 cm from tip to cuff was tunneled in a retrograde fashion from the anterior chest wall to the venotomy incision.  The catheter was then placed through the peel-away sheath with tips ultimately positioned within the superior aspect of the right atrium. Final catheter positioning was confirmed and documented with a spot radiographic image. The catheter aspirates and flushes normally. The catheter was flushed with appropriate volume heparin dwells.  The catheter exit site was secured with a 0-Prolene retention suture. The venotomy incision was closed with an interrupted 4-0 Vicryl, Dermabond and Steri-strips. Dressings were applied. The patient tolerated the procedure well without immediate post procedural complication.  IMPRESSION: Successful placement of 24 cm tip to cuff tunneled PICC via the right internal jugular vein with tips terminating within the superior aspect of the right atrium. The catheter is ready for immediate use.   Electronically Signed   By: Sandi Mariscal M.D.   On: 11/29/2014 17:48   Ir US Guide Vasc Access Right  11/29/2014   INDICATION: History of chronic renal insufficiency. Poor venous access. In need of intravenous access for blood transfusion and steroid administration. History of chronic renal insufficiency. As such, request made for placement of a tunneled PICC line.  EXAM: TUNNELED CENTRAL VENOUS HEMODIALYSIS CATHETER PLACEMENT WITH ULTRASOUND AND FLUOROSCOPIC GUIDANCE  MEDICATIONS: None  CONTRAST:  None  ANESTHESIA/SEDATION: None  FLUOROSCOPY TIME:  18 seconds (9.4 mGy)  COMPLICATIONS: None immediate  PROCEDURE: Informed written consent was obtained from the patient after a discussion of the risks, benefits, and alternatives to treatment. Questions regarding the procedure were encouraged and answered. The right neck and chest were prepped with chlorhexidine in a sterile fashion, and a sterile drape was applied  covering the operative field. Maximum barrier sterile technique with sterile gowns and gloves were used for the procedure. A timeout was performed prior to the initiation of the procedure.  After creating a small venotomy incision, a micropuncture kit was utilized to access the right internal jugular vein under direct, real-time ultrasound guidance after the overlying soft tissues were anesthetized with 1% lidocaine with epinephrine. Ultrasound image documentation was performed. A peel-away sheath was placed and the microwire was kinked to measure appropriate catheter length. A tunneled PICC measuring 24 cm from tip to cuff was tunneled in a retrograde fashion from the anterior chest wall to the venotomy incision.  The catheter was then placed through the peel-away sheath with tips ultimately positioned within the superior aspect of the right atrium. Final catheter positioning  was confirmed and documented with a spot radiographic image. The catheter aspirates and flushes normally. The catheter was flushed with appropriate volume heparin dwells.  The catheter exit site was secured with a 0-Prolene retention suture. The venotomy incision was closed with an interrupted 4-0 Vicryl, Dermabond and Steri-strips. Dressings were applied. The patient tolerated the procedure well without immediate post procedural complication.  IMPRESSION: Successful placement of 24 cm tip to cuff tunneled PICC via the right internal jugular vein with tips terminating within the superior aspect of the right atrium. The catheter is ready for immediate use.   Electronically Signed   By: Sandi Mariscal M.D.   On: 11/29/2014 17:48    Labs:  CBC:  Recent Labs  11/26/14 1146 12/03/14 1239 12/03/14 1247 12/04/14 0538 12/06/14 0515  WBC 6.8 12.6*  --  10.6* 12.7*  HGB 6.8* 10.5* 10.9* 10.2* 9.5*  HCT 20.6* 29.8* 32.0* 29.9* 28.3*  PLT 256 335  --  404* 413*    COAGS:  Recent Labs  09/03/14 0830 11/22/14 1316  INR 0.88 0.96    APTT 26 27    BMP:  Recent Labs  12/04/14 0538 12/05/14 0442 12/05/14 1650 12/06/14 0515  NA 135 135 136 134*  K 5.3* 6.0* 4.6 4.6  CL 108 107 109 105  CO2 14* 14* 12* 13*  GLUCOSE 152* 157* 131* 155*  BUN 124* 148* 153* 154*  CALCIUM 8.5* 8.1* 8.1* 8.0*  CREATININE 8.15* 8.04* 8.22* 8.08*  GFRNONAA 6* 6* 6* 6*  GFRAA 6* 7* 6* 7*    LIVER FUNCTION TESTS:  Recent Labs  01/07/14 0803 10/21/14 2003 11/26/14 1146 12/04/14 0538 12/05/14 0442 12/06/14 0515  BILITOT <0.2* 0.6 0.4 0.3  --   --   AST 35 42* 42* 19  --   --   ALT 15 13* 21 18  --   --   ALKPHOS 40 52 37* 48  --   --   PROT 6.7 7.1 6.6 6.6  --   --   ALBUMIN 2.4* 2.8* 2.9* 3.0* 2.9* 3.0*    TUMOR MARKERS: No results for input(s): AFPTM, CEA, CA199, CHROMGRNA in the last 8760 hours.  Assessment and Plan:  Worsening renal fxn Lupus nephritis Scheduled for tunneled HD catheter in IR Risks and Benefits discussed with the patient including, but not limited to bleeding, infection, vascular injury, pneumothorax which may require chest tube placement, air embolism or even death All of the patient's questions were answered, patient is agreeable to proceed. Consent signed and in chart.    Thank you for this interesting consult.  I greatly enjoyed meeting Elyza Whitt and look forward to participating in their care.  A copy of this report was sent to the requesting provider on this date.  Signed: Duvid Smalls A 12/06/2014, 1:19 PM   I spent a total of 40 Minutes    in face to face in clinical consultation, greater than 50% of which was counseling/coordinating care for HD cath- tunneled

## 2014-12-06 NOTE — Progress Notes (Signed)
Subjective: Patient nausea and vomiting improved today. Has little to no appetite eating <20% of all meals. Sleep is poor, anxiety as a possible contributor but states she feels better after talking with staff. Continues to have dysuria with frank hematuria. States she feels more swollen in her abdomen and neck than normal. Patient spoke with nephrology about preparation for temporary hemodialysis with prolonged period of diminished renal function. Amenable to temp HD access.  Objective: Vital signs in last 24 hours: Filed Vitals:   12/05/14 1312 12/05/14 2207 12/06/14 0500 12/06/14 0551  BP: 173/83 191/97  168/87  Pulse: 91 104  95  Temp: 97.5 F (36.4 C) 98.4 F (36.9 C)  98.1 F (36.7 C)  TempSrc: Oral Oral  Oral  Resp: 17     Height:      Weight:   114.443 kg (252 lb 4.8 oz)   SpO2: 100% 98%  100%   Weight change: 0.907 kg (2 lb)  Intake/Output Summary (Last 24 hours) at 12/06/14 1307 Last data filed at 12/06/14 0800  Gross per 24 hour  Intake    250 ml  Output      0 ml  Net    250 ml   GENERAL- alert, orientedx3 NAD HEENT- Atraumatic, oral mucosa appears moist, no cervical lymphadenopathy appreciated CARDIAC- RRR, no murmurs, rubs or gallops. RESP- CTAB, no wheezes or crackles. ABDOMEN- Left sided TTP, no guarding or rebound, normoactive bowel sounds BACK- Left CVA TTP, mildly tender ecchymoses extending along waistline EXTREMITIES- mild asterixis present on exam, no pedal edema SKIN- Warm, dry, No rash or lesion. PSYCH- Patient does have anxiety, but it is associated with concerns over her current medical condition and hospitalization. Degree of anxiety is appropriate, and she is not experiencing episodes of panic. Otherwise cooperative, pleasant, and well acquainted with her current plan of care Lab Results: Basic Metabolic Panel:  Recent Labs Lab 12/05/14 0442 12/05/14 1650 12/06/14 0515  NA 135 136 134*  K 6.0* 4.6 4.6  CL 107 109 105  CO2 14* 12* 13*    GLUCOSE 157* 131* 155*  BUN 148* 153* 154*  CREATININE 8.04* 8.22* 8.08*  CALCIUM 8.1* 8.1* 8.0*  PHOS 10.1*  --  11.0*   Liver Function Tests:  Recent Labs Lab 12/04/14 0538 12/05/14 0442 12/06/14 0515  AST 19  --   --   ALT 18  --   --   ALKPHOS 48  --   --   BILITOT 0.3  --   --   PROT 6.6  --   --   ALBUMIN 3.0* 2.9* 3.0*   No results for input(s): LIPASE, AMYLASE in the last 168 hours. No results for input(s): AMMONIA in the last 168 hours. CBC:  Recent Labs Lab 12/04/14 0538 12/06/14 0515  WBC 10.6* 12.7*  NEUTROABS 10.3* 12.2*  HGB 10.2* 9.5*  HCT 29.9* 28.3*  MCV 85.9 84.2  PLT 404* 413*   Cardiac Enzymes: No results for input(s): CKTOTAL, CKMB, CKMBINDEX, TROPONINI in the last 168 hours. BNP: No results for input(s): PROBNP in the last 168 hours. D-Dimer: No results for input(s): DDIMER in the last 168 hours. CBG: No results for input(s): GLUCAP in the last 168 hours. Hemoglobin A1C: No results for input(s): HGBA1C in the last 168 hours. Fasting Lipid Panel: No results for input(s): CHOL, HDL, LDLCALC, TRIG, CHOLHDL, LDLDIRECT in the last 168 hours. Thyroid Function Tests: No results for input(s): TSH, T4TOTAL, FREET4, T3FREE, THYROIDAB in the last 168 hours. Coagulation:  No results for input(s): LABPROT, INR in the last 168 hours. Anemia Panel: No results for input(s): VITAMINB12, FOLATE, FERRITIN, TIBC, IRON, RETICCTPCT in the last 168 hours. Urine Drug Screen: Drugs of Abuse     Component Value Date/Time   LABOPIA NONE DETECTED 07/21/2012 0215   COCAINSCRNUR NONE DETECTED 07/21/2012 0215   LABBENZ NONE DETECTED 07/21/2012 0215   AMPHETMU NONE DETECTED 07/21/2012 0215   THCU NONE DETECTED 07/21/2012 0215   LABBARB NONE DETECTED 07/21/2012 0215    Alcohol Level: No results for input(s): ETH in the last 168 hours. Urinalysis:  Recent Labs Lab 12/03/14 1152  COLORURINE YELLOW  LABSPEC 1.019  PHURINE 5.0  GLUCOSEU NEGATIVE  HGBUR  LARGE*  BILIRUBINUR NEGATIVE  KETONESUR NEGATIVE  PROTEINUR >300*  UROBILINOGEN 0.2  NITRITE NEGATIVE  LEUKOCYTESUR MODERATE*    Micro Results: Recent Results (from the past 240 hour(s))  Urine culture     Status: None   Collection Time: 12/03/14  1:44 PM  Result Value Ref Range Status   Specimen Description URINE, CLEAN CATCH  Final   Special Requests Normal  Final   Culture >=100,000 COLONIES/mL KLEBSIELLA PNEUMONIAE  Final   Report Status 12/06/2014 FINAL  Final   Organism ID, Bacteria KLEBSIELLA PNEUMONIAE  Final      Susceptibility   Klebsiella pneumoniae - MIC*    AMPICILLIN 4 RESISTANT Resistant     CEFAZOLIN <=4 SENSITIVE Sensitive     CEFTRIAXONE <=1 SENSITIVE Sensitive     CIPROFLOXACIN <=0.25 SENSITIVE Sensitive     GENTAMICIN <=1 SENSITIVE Sensitive     IMIPENEM <=0.25 SENSITIVE Sensitive     NITROFURANTOIN 64 INTERMEDIATE Intermediate     TRIMETH/SULFA <=20 SENSITIVE Sensitive     AMPICILLIN/SULBACTAM 4 SENSITIVE Sensitive     PIP/TAZO 8 SENSITIVE Sensitive     * >=100,000 COLONIES/mL KLEBSIELLA PNEUMONIAE   Studies/Results: No results found. Medications: I have reviewed the patient's current medications. Scheduled Meds: . amLODipine  10 mg Oral BID  . atorvastatin  20 mg Oral q1800  . calcium acetate  1,334 mg Oral TID WC  . cephALEXin  250 mg Oral Q12H  . famotidine  40 mg Oral Daily  . heparin  5,000 Units Subcutaneous 3 times per day  . hydrALAZINE  10 mg Oral 3 times per day  . metoprolol succinate  50 mg Oral Daily  . mycophenolate  1,500 mg Oral BID  . predniSONE  20 mg Oral TID  . sodium bicarbonate  650 mg Oral TID  . 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 Minimal  improvement in HAGMA with PO bicarbonate from 12 to 13. Remains hypertensive to 160s-190s, and has increased swelling around face and abdomen. Weight has not been closely followed but appears to be increasing since admission. Plan for dialysis for treatment of metabolic acidosis, hypervolemia, uremia. - Change ceftriaxone to cephalexin 250mg  BID, renally dosed for 4 days (12/10/14) for Klebsiella pneumoniae on urine Cx - Plan for HD catheter placement by IR anticipating acute HD - Continue prednisone 20mg  PO TID - Continue Cellcept 1500mg  PO BID - Start zofran 4mg  ODT q6hrs PRN - Start PhosLo 1334mg  TIDWM - Repeat AM renal function panel, CBC - Nephrology consulted, following pt and recs appreciated  Hyperkalemia: K 4.6 down from 6.0. EKG repeated showing no new  changes. - Cardiac monitoring - Continue sodium bicarbonate 650mg  PO BID  HTN: SBPs resistant to CCB and BB therapy, hydralazine started at 10mg  PO TID with modest improvement in BP. Will hold off on dose increase until checking pre- and post- hemodialysis vitals. - Amlodipine 10mg  PO BID - Continue Metoprolol succinate 50mg  PO daily - Continue hydralazine 10mg  TID  Back and flank pain: Pain present since renal biopsy. - Percocet 5-325mg  q6hrs PRN - Flexeril 10mg  qHS PRN  SVT: s/p ablation 2014, has some recurrent palpitations in recent months, currently holter monitor on board. AOCD: Current Hgb 9.5 slightly decreased from 10.2. HLD: Atorvastatin 20mg  PO  FEN: Renal diet, fluid restricted DVT ppx: [place] heparin FULL CODE  Dispo: Disposition deferred until improvement in clinical condition.  The patient does have a current PCP Lin Landsman, MD) and does not need an [facility] hospital follow-up appointment after discharge.  The patient does not have transportation limitations that hinder transportation to clinic appointments.   LOS: 3 days   Collier Salina, MD 12/06/2014, 1:07 PM

## 2014-12-06 NOTE — Progress Notes (Signed)
Subjective:  BP has been high still- K better- BUN stable but poor- She admits that she is likely having uremic symptoms Objective Vital signs in last 24 hours: Filed Vitals:   12/05/14 1312 12/05/14 2207 12/06/14 0500 12/06/14 0551  BP: 173/83 191/97  168/87  Pulse: 91 104  95  Temp: 97.5 F (36.4 C) 98.4 F (36.9 C)  98.1 F (36.7 C)  TempSrc: Oral Oral  Oral  Resp: 17     Height:      Weight:   114.443 kg (252 lb 4.8 oz)   SpO2: 100% 98%  100%   Weight change: 0.907 kg (2 lb)  Intake/Output Summary (Last 24 hours) at 12/06/14 0949 Last data filed at 12/06/14 0515  Gross per 24 hour  Intake    490 ml  Output      0 ml  Net    490 ml    Assessment/Plan: 39 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 Her GFR  has not changed too much with the change in creatinine from the 6's to the 8's. However, now with BUN in the 150's. There could be some chronic indications for dialysis and I have told her this. She is agreeable to anything that might help.  The biopsy suggest that this could be reversible so would continue with immune suppressing therapy for now but will support with HD. I will order a TDC thru IR.  It is too soon to tell if her RI will be reversible- ie if dialysis support will continue to be needed after leaving hospital.   2. Renal hematoma after biopsy - per imaging does not seem to be worsening- hgb falling some 3. HTN- overall is high- maybe due to steroids. On amlodipine and toprol-  toprol increased to 50 daily and now hydralazine added.  Volume is likely playing a role as well.  Therefore HD should help 4. Anemia - stable and better than where it was immediately after biopsy  5.  Hyperkalemia- on low K diet- is better today 6. UTI- on rocephin per primary team- culture pending - symptoms improved  7. Metabolic acidosis- started sodium bicarb but should resolve with HD as well  8. Hyperphos- will start phoslo     [name] A    Labs: Basic Metabolic Panel:  Recent Labs Lab 12/05/14 0442 12/05/14 1650 12/06/14 0515  NA 135 136 134*  K 6.0* 4.6 4.6  CL 107 109 105  CO2 14* 12* 13*  GLUCOSE 157* 131* 155*  BUN 148* 153* 154*  CREATININE 8.04* 8.22* 8.08*  CALCIUM 8.1* 8.1* 8.0*  PHOS 10.1*  --  11.0*   Liver Function Tests:  Recent Labs Lab 12/04/14 0538 12/05/14 0442 12/06/14 0515  AST 19  --   --   ALT 18  --   --   ALKPHOS 48  --   --   BILITOT 0.3  --   --   PROT 6.6  --   --   ALBUMIN 3.0* 2.9* 3.0*   No results for input(s): LIPASE, AMYLASE in the last 168 hours. No results for input(s): AMMONIA in the last 168 hours. CBC:  Recent Labs Lab 12/03/14 1239 12/03/14 1247 12/04/14 0538 12/06/14 0515  WBC 12.6*  --  10.6* 12.7*  NEUTROABS 11.5*  --  10.3* 12.2*  HGB 10.5* 10.9* 10.2* 9.5*  HCT 29.8* 32.0* 29.9* 28.3*  MCV 84.2  --  85.9 84.2  PLT 335  --  404* 413*   Cardiac Enzymes: No results for input(s): CKTOTAL, CKMB, CKMBINDEX, TROPONINI in the last 168 hours. CBG: No results for input(s): GLUCAP in the last 168 hours.  Iron Studies: No results for input(s): IRON, TIBC, TRANSFERRIN, FERRITIN in the last 72 hours. Studies/Results: No results found. Medications: Infusions:    Scheduled Medications: . amLODipine  10 mg Oral BID  . atorvastatin  20 mg Oral q1800  . cefTRIAXone (ROCEPHIN)  IV  1 g Intravenous Q24H  . famotidine  40 mg Oral Daily  . heparin  5,000 Units Subcutaneous 3 times per day  . hydrALAZINE  10 mg Oral 3 times per day  . metoprolol succinate  50 mg Oral Daily  . mycophenolate  1,500 mg Oral BID  . predniSONE  20 mg Oral TID  . sodium bicarbonate  650 mg Oral TID  . sodium chloride   3 mL Intravenous Q12H    have reviewed scheduled and prn medications.  Physical Exam: General: NAD Heart: RRR Lungs: clear Abdomen: soft, non tender Extremities: now with edema    12/06/2014,9:49 AM  LOS: 3 days

## 2014-12-07 ENCOUNTER — Inpatient Hospital Stay (HOSPITAL_COMMUNITY): Payer: Medicaid Other

## 2014-12-07 LAB — RENAL FUNCTION PANEL
Albumin: 3.1 g/dL — ABNORMAL LOW (ref 3.5–5.0)
Anion gap: 16 — ABNORMAL HIGH (ref 5–15)
BUN: 170 mg/dL — AB (ref 6–20)
CHLORIDE: 109 mmol/L (ref 101–111)
CO2: 12 mmol/L — AB (ref 22–32)
CREATININE: 8.34 mg/dL — AB (ref 0.44–1.00)
Calcium: 8.3 mg/dL — ABNORMAL LOW (ref 8.9–10.3)
GFR calc Af Amer: 6 mL/min — ABNORMAL LOW (ref >60)
GFR calc non Af Amer: 5 mL/min — ABNORMAL LOW (ref >60)
GLUCOSE: 122 mg/dL — AB (ref 65–99)
POTASSIUM: 4.8 mmol/L (ref 3.5–5.1)
Phosphorus: 12.3 mg/dL — ABNORMAL HIGH (ref 2.5–4.6)
Sodium: 137 mmol/L (ref 135–145)

## 2014-12-07 LAB — CBC
HEMATOCRIT: 27.3 % — AB (ref 36.0–46.0)
Hemoglobin: 9.3 g/dL — ABNORMAL LOW (ref 12.0–15.0)
MCH: 28.4 pg (ref 26.0–34.0)
MCHC: 34.1 g/dL (ref 30.0–36.0)
MCV: 83.5 fL (ref 78.0–100.0)
PLATELETS: 373 10*3/uL (ref 150–400)
RBC: 3.27 MIL/uL — ABNORMAL LOW (ref 3.87–5.11)
RDW: 14.1 % (ref 11.5–15.5)
WBC: 17.9 10*3/uL — AB (ref 4.0–10.5)

## 2014-12-07 MED ORDER — LIDOCAINE HCL 1 % IJ SOLN
INTRAMUSCULAR | Status: AC
  Filled 2014-12-07: qty 20

## 2014-12-07 MED ORDER — LIDOCAINE-EPINEPHRINE 1 %-1:100000 IJ SOLN
10.0000 mL | INTRAMUSCULAR | Status: AC
  Filled 2014-12-07: qty 10

## 2014-12-07 MED ORDER — FENTANYL CITRATE (PF) 100 MCG/2ML IJ SOLN
INTRAMUSCULAR | Status: AC | PRN
  Administered 2014-12-07 (×2): 25 ug via INTRAVENOUS
  Administered 2014-12-07: 50 ug via INTRAVENOUS
  Administered 2014-12-07 (×4): 25 ug via INTRAVENOUS

## 2014-12-07 MED ORDER — MIDAZOLAM HCL 2 MG/2ML IJ SOLN
INTRAMUSCULAR | Status: AC | PRN
  Administered 2014-12-07 (×4): 0.5 mg via INTRAVENOUS
  Administered 2014-12-07: 1 mg via INTRAVENOUS
  Administered 2014-12-07 (×2): 0.5 mg via INTRAVENOUS

## 2014-12-07 MED ORDER — IOHEXOL 300 MG/ML  SOLN
100.0000 mL | Freq: Once | INTRAMUSCULAR | Status: DC | PRN
  Administered 2014-12-07: 50 mL via INTRAVENOUS
  Filled 2014-12-07: qty 100

## 2014-12-07 MED ORDER — CEFAZOLIN SODIUM-DEXTROSE 2-3 GM-% IV SOLR
INTRAVENOUS | Status: AC
  Administered 2014-12-07: 2 g via INTRAVENOUS
  Filled 2014-12-07: qty 50

## 2014-12-07 MED ORDER — CYCLOBENZAPRINE HCL 10 MG PO TABS
10.0000 mg | ORAL_TABLET | Freq: Every day | ORAL | Status: DC
  Administered 2014-12-07 – 2015-01-04 (×29): 10 mg via ORAL
  Filled 2014-12-07 (×29): qty 1

## 2014-12-07 MED ORDER — HEPARIN SODIUM (PORCINE) 1000 UNIT/ML IJ SOLN
INTRAMUSCULAR | Status: AC
  Filled 2014-12-07: qty 1

## 2014-12-07 MED ORDER — CHLORHEXIDINE GLUCONATE 4 % EX LIQD
CUTANEOUS | Status: AC
  Administered 2014-12-07: 13:00:00
  Filled 2014-12-07: qty 15

## 2014-12-07 MED ORDER — FENTANYL CITRATE (PF) 100 MCG/2ML IJ SOLN
INTRAMUSCULAR | Status: AC
  Filled 2014-12-07: qty 6

## 2014-12-07 MED ORDER — OXYCODONE HCL 5 MG PO TABS
5.0000 mg | ORAL_TABLET | Freq: Once | ORAL | Status: AC
  Administered 2014-12-08: 5 mg via ORAL
  Filled 2014-12-07: qty 1

## 2014-12-07 MED ORDER — MIDAZOLAM HCL 2 MG/2ML IJ SOLN
INTRAMUSCULAR | Status: AC
  Filled 2014-12-07: qty 6

## 2014-12-07 NOTE — Progress Notes (Signed)
Paged provider for pt's elevated bp. Per provider give pt her scheduled po hydralazine and recheck in 30 mins. Will continue to monitor pt.

## 2014-12-07 NOTE — Progress Notes (Signed)
Subjective: Patient was NPO since midnight for femoral HD catheter placement this morning. Complains of nausea w/o active vomiting since last night, controlled with PO zofran. Is scheduled for hemodialysis through femoral HD catheter placed this AM.  Objective: Vital signs in last 24 hours: Filed Vitals:   12/07/14 1036 12/07/14 1039 12/07/14 1047 12/07/14 1054  BP: 180/91 180/92 183/86 184/89  Pulse: 97 94 93 104  Temp:      TempSrc:      Resp: _0 Height:      Weight:      SpO2: 96%      Weight change:   Intake/Output Summary (Last 24 hours) at 12/07/14 1349 Last data filed at 12/06/14 1440  Gross per 24 hour  Intake     20 ml  Output      0 ml  Net     20 ml   GENERAL- alert, orientedx3, NAD HEENT- Atraumatic, oral mucosa appears moist CARDIAC- RRR, no murmurs, rubs or gallops. RESP- CTAB, no wheezes or crackles. ABDOMEN- No left sided abdominal TTP on exam, no guarding or rebound, normoactive bowel sounds BACK- Left CVA tenderness, mildly tender ecchymoses extending along waistline EXTREMITIES- trace pedal edema SKIN- Warm, dry [facility]- Appropriate  Lab Results: Basic Metabolic Panel:  Recent Labs Lab 12/05/14 0442 12/05/14 1650 12/06/14 0515  NA 135 136 134*  K 6.0* 4.6 4.6  CL 107 109 105  CO2 14* 12* 13*  GLUCOSE 157* 131* 155*  BUN 148* 153* 154*  CREATININE 8.04* 8.22* 8.08*  CALCIUM 8.1* 8.1* 8.0*  PHOS 10.1*  --  11.0*   Liver Function Tests:  Recent Labs Lab 12/04/14 0538 12/05/14 0442 12/06/14 0515  AST 19  --   --   ALT 18  --   --   ALKPHOS 48  --   --   BILITOT 0.3  --   --   PROT 6.6  --   --   ALBUMIN 3.0* 2.9* 3.0*   No results for input(s): LIPASE, AMYLASE in the last 168 hours. No results for input(s): AMMONIA in the last 168 hours. CBC:  Recent Labs Lab 12/04/14 0538 12/06/14 0515  WBC 10.6* 12.7*  NEUTROABS 10.3* 12.2*  HGB 10.2* 9.5*  HCT 29.9* 28.3*  MCV 85.9 84.2  PLT 404* 413*   Cardiac Enzymes: No  results for input(s): CKTOTAL, CKMB, CKMBINDEX, TROPONINI in the last 168 hours. BNP: No results for input(s): PROBNP in the last 168 hours. D-Dimer: No results for input(s): DDIMER in the last 168 hours. CBG: No results for input(s): GLUCAP in the last 168 hours. Hemoglobin A1C: No results for input(s): HGBA1C in the last 168 hours. Fasting Lipid Panel: No results for input(s): CHOL, HDL, LDLCALC, TRIG, CHOLHDL, LDLDIRECT in the last 168 hours. Thyroid Function Tests: No results for input(s): TSH, T4TOTAL, FREET4, T3FREE, THYROIDAB in the last 168 hours. Coagulation:  Recent Labs Lab 12/06/14 1440  LABPROT 13.9  INR 1.05   Anemia Panel: No results for input(s): VITAMINB12, FOLATE, FERRITIN, TIBC, IRON, RETICCTPCT in the last 168 hours. Urine Drug Screen: Drugs of Abuse     Component Value Date/Time   LABOPIA NONE DETECTED 07/21/2012 0215   COCAINSCRNUR NONE DETECTED 07/21/2012 0215   LABBENZ NONE DETECTED 07/21/2012 0215   AMPHETMU NONE DETECTED 07/21/2012 0215   THCU NONE DETECTED 07/21/2012 0215   LABBARB NONE DETECTED 07/21/2012 0215    Alcohol Level: No results for input(s): ETH in the last 168 hours. Urinalysis:  Recent Labs Lab 12/03/14 1152  COLORURINE YELLOW  LABSPEC 1.019  PHURINE 5.0  GLUCOSEU NEGATIVE  HGBUR LARGE*  BILIRUBINUR NEGATIVE  KETONESUR NEGATIVE  PROTEINUR >300*  UROBILINOGEN 0.2  NITRITE NEGATIVE  LEUKOCYTESUR MODERATE*    Micro Results: Recent Results (from the past 240 hour(s))  Urine culture     Status: None   Collection Time: 12/03/14  1:44 PM  Result Value Ref Range Status   Specimen Description URINE, CLEAN CATCH  Final   Special Requests Normal  Final   Culture >=100,000 COLONIES/mL KLEBSIELLA PNEUMONIAE  Final   Report Status 12/06/2014 FINAL  Final   Organism ID, Bacteria KLEBSIELLA PNEUMONIAE  Final      Susceptibility   Klebsiella pneumoniae - MIC*    AMPICILLIN 4 RESISTANT Resistant     CEFAZOLIN <=4 SENSITIVE  Sensitive     CEFTRIAXONE <=1 SENSITIVE Sensitive     CIPROFLOXACIN <=0.25 SENSITIVE Sensitive     GENTAMICIN <=1 SENSITIVE Sensitive     IMIPENEM <=0.25 SENSITIVE Sensitive     NITROFURANTOIN 64 INTERMEDIATE Intermediate     TRIMETH/SULFA <=20 SENSITIVE Sensitive     AMPICILLIN/SULBACTAM 4 SENSITIVE Sensitive     PIP/TAZO 8 SENSITIVE Sensitive     * >=100,000 COLONIES/mL KLEBSIELLA PNEUMONIAE   Studies/Results: Ir Pta Venous Left  12/07/2014   INDICATION: 39 year old female with end-stage renal disease has been referred for tunneled hemodialysis catheter placement for initiation of hemodialysis.  She has had a prior right-sided tunneled IJ catheter placed for medications. The left IJ approach was selected.  EXAM: TUNNELED CENTRAL VENOUS HEMODIALYSIS CATHETER PLACEMENT WITH ULTRASOUND AND FLUOROSCOPIC GUIDANCE  ULTRASOUND GUIDED ACCESS OF LEFT INTERNAL JUGULAR VEIN AND RIGHT COMMON FEMORAL VEIN  MEDICATIONS: Ancef 2 gm IV; The IV antibiotic was given in an appropriate time interval prior to skin puncture.  CONTRAST:  None  ANESTHESIA/SEDATION: Versed 4.0 mg IV; Fentanyl 200 mcg IV  Total Moderate Sedation Time  One hundred forty-one minutes.  FLUOROSCOPY TIME:  11 minutes 30 seconds  COMPLICATIONS: None  PROCEDURE: Informed written consent was obtained from the patient after a discussion of the risks, benefits, and alternatives to treatment. Questions regarding the procedure were encouraged and answered. The left neck and chest were prepped with chlorhexidine in a sterile fashion, and a sterile drape was applied covering the operative field. Maximum barrier sterile technique with sterile gowns and gloves were used for the procedure. A timeout was performed prior to the initiation of the procedure.  After creating a small venotomy incision, a micropuncture kit was utilized to access the left internal jugular vein under direct, real-time ultrasound guidance after the overlying soft tissues were  anesthetized with 1% lidocaine with epinephrine. Ultrasound image documentation was performed.  Once the micropuncture set was introduced, the inner dilator wire removed and a wire was navigated into the right heart. Serial dilation of the soft tissue tract was performed over the wire, and a Kumpe the catheter was advanced over the wire navigating the wire into the IVC. Once the catheter was advanced the IVC the wire was exchanged for stiff wire. Before removing the first wire a length of 23 cm tip to cuff was estimated.  1% lidocaine was used to anesthetize the chest, and a small incision was made with 11 blade scalpel. The catheter was then back tunneled through the soft tissues to the IJ puncture site. Catheter was then pulled through the tract. Peel-away sheath was placed over the stiff wire, and then the catheter was passed through  the peel-away sheath. Peel-away sheath was removed.  Tip the catheter was positioned at the superior cavoatrial junction. Aspiration of the ports on the Palindrome catheter proved that the catheter was nonfunctional. Positioning of the catheter at multiple sites at the right atrium and superior cavoatrial junction prove to be nonfunctional.  Catheter was removed over a stiff wire which was subsequently advanced into the IVC. Balloon dilation of the right atrium was performed with and 8 mm balloon through a 10 French sheath in the tissue tract. Once the balloon was removed a new 23 cm tip to cuff Palindrome catheter was placed with the tip positioned at several positions at the superior cavoatrial junction and the right atrium. The catheter was nonfunctional.  At this time the tunneled right IJ catheter was exchanged over a wire, for the possibility of a catheter associated thrombus as the obstruction to flow of the left-sided HD catheter. This is dictated under different heading.  The left IJ catheter site was abandoned at this point with removal of the catheter and sterile dressing  placed at the left IJ puncture site and left chest wall.  The right femoral vein was selected for a new catheter site.  The right femoral region was then prepped with chlorhexidine in a sterile fashion, and a sterile drape was applied covering the operative field. Maximum barrier sterile technique with sterile gowns and gloves were used for the procedure.  After creating a small venotomy incision, a micropuncture kit was utilized to access the right femoral vein under direct, real-time ultrasound guidance after the overlying soft tissues were anesthetized with 1% lidocaine with epinephrine. Ultrasound image documentation was performed.  Once the micropuncture set was introduced, the inner dilator wire removed and a wire was navigated into the iliac vein. The inner dilator was removed with the wire and an 035 Bentson wire was advanced into the IVC. Soft tissue tract dilation was performed. A 44 cm tip to cuff hemodialysis catheter was estimated.  1% lidocaine was used to anesthetize the skin and subcutaneous tissues lateral to the femoral puncture site, and a small incision was made with 11 blade scalpel. The catheter was then back tunneled through the soft tissues. Catheter was then pulled through the tract. Peel-away sheath was placed over the wire, and then the catheter was passed through the peel-away sheath. Peel-away sheath was removed.  The 44 cm tip to cuff catheter was positioned just below the diaphragm and the red and blue port were aspirated. The catheter was nonfunctional.  Catheter was removed over wire with a 10 French 25 cm sheath placed over the wire. A pigtail catheter was then advanced over the wire and an IVC cavagram was performed.  A 33 cm tip to cuff catheter was selected to place in the mid IVC. This was placed over a stiff wire once the sheath was removed through a peel-away.  This was proven to be functional in the mid IVC.  Patient tolerated the procedure well and remained hemodynamically  stable throughout.  No complications were encountered and no significant blood loss was encountered.  Final catheter positioning was confirmed and documented with a spot radiographic image. The catheter aspirates and flushes normally. The catheter was flushed with appropriate volume heparin dwells.  The catheter exit site was secured with a 0-Prolene retention suture. Venotomy was closed with Derma bond. Dressings were applied. The patient tolerated the procedure well without immediate post procedural complication.  IMPRESSION: Status post placement of right femoral vein tunneled hemodialysis catheter.  This catheter is a 33 cm tip to cuff position in the mid IVC and is proven to be functional. Catheter is ready for use.  Before placement of the right femoral vein catheter, a left IJ approach 23 cm tip to cuff catheter was placed which was nonfunctional, as well as a right femoral catheter measuring 44 cm tip to cuff, which was nonfunctional. Presumably this was related to patient anatomy of the right heart and hepatic IVC.  Signed,  Dulcy Fanny. Earleen Newport, DO  [facility]  [facility]  PLAN: For future catheter placement, is useful to know that the catheter placed from left IJ approach proved to be non-functional at multiple positions within the right heart, although this remains a possible option. In addition, a catheter positioned at the hepatic IVC was also proved to be nonfunctional on today's study, potentially related to the size of the IVC.   Electronically Signed   By: Corrie Mckusick D.O.   On: 12/07/2014 13:00   Ir Fluoro Guide Cv Line Left  12/07/2014   INDICATION: 39 year old female with end-stage renal disease has been referred for tunneled hemodialysis catheter placement for initiation of hemodialysis.  She has had a prior right-sided tunneled IJ catheter placed for medications. The left IJ approach was selected.  EXAM: TUNNELED CENTRAL VENOUS HEMODIALYSIS  CATHETER PLACEMENT WITH ULTRASOUND AND FLUOROSCOPIC GUIDANCE  ULTRASOUND GUIDED ACCESS OF LEFT INTERNAL JUGULAR VEIN AND RIGHT COMMON FEMORAL VEIN  MEDICATIONS: Ancef 2 gm IV; The IV antibiotic was given in an appropriate time interval prior to skin puncture.  CONTRAST:  None  ANESTHESIA/SEDATION: Versed 4.0 mg IV; Fentanyl 200 mcg IV  Total Moderate Sedation Time  One hundred forty-one minutes.  FLUOROSCOPY TIME:  11 minutes 30 seconds  COMPLICATIONS: None  PROCEDURE: Informed written consent was obtained from the patient after a discussion of the risks, benefits, and alternatives to treatment. Questions regarding the procedure were encouraged and answered. The left neck and chest were prepped with chlorhexidine in a sterile fashion, and a sterile drape was applied covering the operative field. Maximum barrier sterile technique with sterile gowns and gloves were used for the procedure. A timeout was performed prior to the initiation of the procedure.  After creating a small venotomy incision, a micropuncture kit was utilized to access the left internal jugular vein under direct, real-time ultrasound guidance after the overlying soft tissues were anesthetized with 1% lidocaine with epinephrine. Ultrasound image documentation was performed.  Once the micropuncture set was introduced, the inner dilator wire removed and a wire was navigated into the right heart. Serial dilation of the soft tissue tract was performed over the wire, and a Kumpe the catheter was advanced over the wire navigating the wire into the IVC. Once the catheter was advanced the IVC the wire was exchanged for stiff wire. Before removing the first wire a length of 23 cm tip to cuff was estimated.  1% lidocaine was used to anesthetize the chest, and a small incision was made with 11 blade scalpel. The catheter was then back tunneled through the soft tissues to the IJ puncture site. Catheter was then pulled through the tract. Peel-away sheath was  placed over the stiff wire, and then the catheter was passed through the peel-away sheath. Peel-away sheath was removed.  Tip the catheter was positioned at the superior cavoatrial junction. Aspiration of the ports on the Palindrome catheter proved that the catheter was nonfunctional. Positioning of the catheter at multiple sites at the right  atrium and superior cavoatrial junction prove to be nonfunctional.  Catheter was removed over a stiff wire which was subsequently advanced into the IVC. Balloon dilation of the right atrium was performed with and 8 mm balloon through a 10 French sheath in the tissue tract. Once the balloon was removed a new 23 cm tip to cuff Palindrome catheter was placed with the tip positioned at several positions at the superior cavoatrial junction and the right atrium. The catheter was nonfunctional.  At this time the tunneled right IJ catheter was exchanged over a wire, for the possibility of a catheter associated thrombus as the obstruction to flow of the left-sided HD catheter. This is dictated under different heading.  The left IJ catheter site was abandoned at this point with removal of the catheter and sterile dressing placed at the left IJ puncture site and left chest wall.  The right femoral vein was selected for a new catheter site.  The right femoral region was then prepped with chlorhexidine in a sterile fashion, and a sterile drape was applied covering the operative field. Maximum barrier sterile technique with sterile gowns and gloves were used for the procedure.  After creating a small venotomy incision, a micropuncture kit was utilized to access the right femoral vein under direct, real-time ultrasound guidance after the overlying soft tissues were anesthetized with 1% lidocaine with epinephrine. Ultrasound image documentation was performed.  Once the micropuncture set was introduced, the inner dilator wire removed and a wire was navigated into the iliac vein. The inner  dilator was removed with the wire and an 035 Bentson wire was advanced into the IVC. Soft tissue tract dilation was performed. A 44 cm tip to cuff hemodialysis catheter was estimated.  1% lidocaine was used to anesthetize the skin and subcutaneous tissues lateral to the femoral puncture site, and a small incision was made with 11 blade scalpel. The catheter was then back tunneled through the soft tissues. Catheter was then pulled through the tract. Peel-away sheath was placed over the wire, and then the catheter was passed through the peel-away sheath. Peel-away sheath was removed.  The 44 cm tip to cuff catheter was positioned just below the diaphragm and the red and blue port were aspirated. The catheter was nonfunctional.  Catheter was removed over wire with a 10 French 25 cm sheath placed over the wire. A pigtail catheter was then advanced over the wire and an IVC cavagram was performed.  A 33 cm tip to cuff catheter was selected to place in the mid IVC. This was placed over a stiff wire once the sheath was removed through a peel-away.  This was proven to be functional in the mid IVC.  Patient tolerated the procedure well and remained hemodynamically stable throughout.  No complications were encountered and no significant blood loss was encountered.  Final catheter positioning was confirmed and documented with a spot radiographic image. The catheter aspirates and flushes normally. The catheter was flushed with appropriate volume heparin dwells.  The catheter exit site was secured with a 0-Prolene retention suture. Venotomy was closed with Derma bond. Dressings were applied. The patient tolerated the procedure well without immediate post procedural complication.  IMPRESSION: Status post placement of right femoral vein tunneled hemodialysis catheter. This catheter is a 33 cm tip to cuff position in the mid IVC and is proven to be functional. Catheter is ready for use.  Before placement of the right femoral vein  catheter, a left IJ approach 23 cm tip to  cuff catheter was placed which was nonfunctional, as well as a right femoral catheter measuring 44 cm tip to cuff, which was nonfunctional. Presumably this was related to patient anatomy of the right heart and hepatic IVC.  Signed,  Dulcy Fanny. Earleen Newport, DO  [facility]  [facility]iology  PLAN: For future catheter placement, is useful to know that the catheter placed from left IJ approach proved to be non-functional at multiple positions within the right heart, although this remains a possible option. In addition, a catheter positioned at the hepatic IVC was also proved to be nonfunctional on today's study, potentially related to the size of the IVC.   Electronically Signed   By: Corrie Mckusick D.O.   On: 12/07/2014 13:00   Ir Fluoro Guide Cv Line Right  12/07/2014   INDICATION: 39 year old female with end-stage renal disease has been referred for tunneled hemodialysis catheter placement for initiation of hemodialysis.  She has had a prior right-sided tunneled IJ catheter placed for medications. The left IJ approach was selected.  EXAM: TUNNELED CENTRAL VENOUS HEMODIALYSIS CATHETER PLACEMENT WITH ULTRASOUND AND FLUOROSCOPIC GUIDANCE  ULTRASOUND GUIDED ACCESS OF LEFT INTERNAL JUGULAR VEIN AND RIGHT COMMON FEMORAL VEIN  MEDICATIONS: Ancef 2 gm IV; The IV antibiotic was given in an appropriate time interval prior to skin puncture.  CONTRAST:  None  ANESTHESIA/SEDATION: Versed 4.0 mg IV; Fentanyl 200 mcg IV  Total Moderate Sedation Time  One hundred forty-one minutes.  FLUOROSCOPY TIME:  11 minutes 30 seconds  COMPLICATIONS: None  PROCEDURE: Informed written consent was obtained from the patient after a discussion of the risks, benefits, and alternatives to treatment. Questions regarding the procedure were encouraged and answered. The left neck and chest were prepped with chlorhexidine in a sterile fashion, and a sterile drape was applied  covering the operative field. Maximum barrier sterile technique with sterile gowns and gloves were used for the procedure. A timeout was performed prior to the initiation of the procedure.  After creating a small venotomy incision, a micropuncture kit was utilized to access the left internal jugular vein under direct, real-time ultrasound guidance after the overlying soft tissues were anesthetized with 1% lidocaine with epinephrine. Ultrasound image documentation was performed.  Once the micropuncture set was introduced, the inner dilator wire removed and a wire was navigated into the right heart. Serial dilation of the soft tissue tract was performed over the wire, and a Kumpe the catheter was advanced over the wire navigating the wire into the IVC. Once the catheter was advanced the IVC the wire was exchanged for stiff wire. Before removing the first wire a length of 23 cm tip to cuff was estimated.  1% lidocaine was used to anesthetize the chest, and a small incision was made with 11 blade scalpel. The catheter was then back tunneled through the soft tissues to the IJ puncture site. Catheter was then pulled through the tract. Peel-away sheath was placed over the stiff wire, and then the catheter was passed through the peel-away sheath. Peel-away sheath was removed.  Tip the catheter was positioned at the superior cavoatrial junction. Aspiration of the ports on the Palindrome catheter proved that the catheter was nonfunctional. Positioning of the catheter at multiple sites at the right atrium and superior cavoatrial junction prove to be nonfunctional.  Catheter was removed over a stiff wire which was subsequently advanced into the IVC. Balloon dilation of the right atrium was performed with and 8 mm balloon through a 10 French sheath in  the tissue tract. Once the balloon was removed a new 23 cm tip to cuff Palindrome catheter was placed with the tip positioned at several positions at the superior cavoatrial  junction and the right atrium. The catheter was nonfunctional.  At this time the tunneled right IJ catheter was exchanged over a wire, for the possibility of a catheter associated thrombus as the obstruction to flow of the left-sided HD catheter. This is dictated under different heading.  The left IJ catheter site was abandoned at this point with removal of the catheter and sterile dressing placed at the left IJ puncture site and left chest wall.  The right femoral vein was selected for a new catheter site.  The right femoral region was then prepped with chlorhexidine in a sterile fashion, and a sterile drape was applied covering the operative field. Maximum barrier sterile technique with sterile gowns and gloves were used for the procedure.  After creating a small venotomy incision, a micropuncture kit was utilized to access the right femoral vein under direct, real-time ultrasound guidance after the overlying soft tissues were anesthetized with 1% lidocaine with epinephrine. Ultrasound image documentation was performed.  Once the micropuncture set was introduced, the inner dilator wire removed and a wire was navigated into the iliac vein. The inner dilator was removed with the wire and an 035 Bentson wire was advanced into the IVC. Soft tissue tract dilation was performed. A 44 cm tip to cuff hemodialysis catheter was estimated.  1% lidocaine was used to anesthetize the skin and subcutaneous tissues lateral to the femoral puncture site, and a small incision was made with 11 blade scalpel. The catheter was then back tunneled through the soft tissues. Catheter was then pulled through the tract. Peel-away sheath was placed over the wire, and then the catheter was passed through the peel-away sheath. Peel-away sheath was removed.  The 44 cm tip to cuff catheter was positioned just below the diaphragm and the red and blue port were aspirated. The catheter was nonfunctional.  Catheter was removed over wire with a 10  French 25 cm sheath placed over the wire. A pigtail catheter was then advanced over the wire and an IVC cavagram was performed.  A 33 cm tip to cuff catheter was selected to place in the mid IVC. This was placed over a stiff wire once the sheath was removed through a peel-away.  This was proven to be functional in the mid IVC.  Patient tolerated the procedure well and remained hemodynamically stable throughout.  No complications were encountered and no significant blood loss was encountered.  Final catheter positioning was confirmed and documented with a spot radiographic image. The catheter aspirates and flushes normally. The catheter was flushed with appropriate volume heparin dwells.  The catheter exit site was secured with a 0-Prolene retention suture. Venotomy was closed with Derma bond. Dressings were applied. The patient tolerated the procedure well without immediate post procedural complication.  IMPRESSION: Status post placement of right femoral vein tunneled hemodialysis catheter. This catheter is a 33 cm tip to cuff position in the mid IVC and is proven to be functional. Catheter is ready for use.  Before placement of the right femoral vein catheter, a left IJ approach 23 cm tip to cuff catheter was placed which was nonfunctional, as well as a right femoral catheter measuring 44 cm tip to cuff, which was nonfunctional. Presumably this was related to patient anatomy of the right heart and hepatic IVC.  Signed,  Dulcy Fanny. Earleen Newport,  DO  [facility]  [facility] Radiology  PLAN: For future catheter placement, is useful to know that the catheter placed from left IJ approach proved to be non-functional at multiple positions within the right heart, although this remains a possible option. In addition, a catheter positioned at the hepatic IVC was also proved to be nonfunctional on today's study, potentially related to the size of the IVC.   Electronically Signed   By: Corrie Mckusick D.O.   On: 12/07/2014 13:00   Ir Fluoro Guide Cv Line Right  12/07/2014   CLINICAL DATA:  39 year old female presents for initiation of hemodialysis, and placement of tunneled hemodialysis catheter.  A previous tunneled right central catheter is in place from right IJ approach and [place] left IJ was initially selected for hemodialysis catheter placement.  Nonfunctioning hemodialysis catheter from left IJ approach necessitated exchange of the right-sided IJ central catheter for trouble shooting purposes.  EXAM: ULTRASOUND AND FLUOROSCOPIC GUIDED RIGHT TUNNELED CATHETER EXCHANGE.  FLUOROSCOPY TIME:  11 MINUTES 30 SECONDS  TECHNIQUE: The procedure, risks, benefits, and alternatives were explained to the patient. Questions regarding the procedure were encouraged and answered. The patient understands and consents to the procedure.  The patient is position in the supine position on the fluoroscopy table. Right neck and right upper chest were prepped and draped in the usual sterile fashion with sterile gloves counts and usual sterile technique. 1% lidocaine was used for local anesthesia.  For trouble shooting purposes the right tunneled IJ central catheter was exchanged over a wire. The new catheter was trimmed at 21 cm length and confirmed in position within the right atrium after replacement.  Catheter was sutured in position with retention sutures.  After exchange of this catheter the case was continued with removal of left IJ hemodialysis catheter and placement of a right femoral line for hemodialysis initiation.  Patient tolerated the procedure well and remained hemodynamically stable throughout.  No complications were encountered and no significant blood loss was encountered.  COMPLICATIONS: None  IMPRESSION: Exchange of right IJ central catheter was performed during placement of a hemodialysis catheter for trouble shooting purposes when the initial left IJ approach HD catheter would not function.  Upon  completion of exchange of the tunneled central catheter from right IJ approach, the catheter is functioning.  Signed,  Dulcy Fanny. Earleen Newport, DO  [facility]  [facility] Radiology   Electronically Signed   By: Corrie Mckusick D.O.   On: 12/07/2014 12:30   Ir US Guide Vasc Access Left  12/07/2014   INDICATION: 38 year old female with end-stage renal disease has been referred for tunneled hemodialysis catheter placement for initiation of hemodialysis.  She has had a prior right-sided tunneled IJ catheter placed for medications. The left IJ approach was selected.  EXAM: TUNNELED CENTRAL VENOUS HEMODIALYSIS CATHETER PLACEMENT WITH ULTRASOUND AND FLUOROSCOPIC GUIDANCE  ULTRASOUND GUIDED ACCESS OF LEFT INTERNAL JUGULAR VEIN AND RIGHT COMMON FEMORAL VEIN  MEDICATIONS: Ancef 2 gm IV; The IV antibiotic was given in an appropriate time interval prior to skin puncture.  CONTRAST:  None  ANESTHESIA/SEDATION: Versed 4.0 mg IV; Fentanyl 200 mcg IV  Total Moderate Sedation Time  One hundred forty-one minutes.  FLUOROSCOPY TIME:  11 minutes 30 seconds  COMPLICATIONS: None  PROCEDURE: Informed written consent was obtained from the patient after a discussion of the risks, benefits, and alternatives to treatment. Questions regarding the procedure were encouraged and answered. The left neck and chest were prepped with chlorhexidine  in a sterile fashion, and a sterile drape was applied covering the operative field. Maximum barrier sterile technique with sterile gowns and gloves were used for the procedure. A timeout was performed prior to the initiation of the procedure.  After creating a small venotomy incision, a micropuncture kit was utilized to access the left internal jugular vein under direct, real-time ultrasound guidance after the overlying soft tissues were anesthetized with 1% lidocaine with epinephrine. Ultrasound image documentation was performed.  Once the micropuncture set was introduced,  the inner dilator wire removed and a wire was navigated into the right heart. Serial dilation of the soft tissue tract was performed over the wire, and a Kumpe the catheter was advanced over the wire navigating the wire into the IVC. Once the catheter was advanced the IVC the wire was exchanged for stiff wire. Before removing the first wire a length of 23 cm tip to cuff was estimated.  1% lidocaine was used to anesthetize the chest, and a small incision was made with 11 blade scalpel. The catheter was then back tunneled through the soft tissues to the IJ puncture site. Catheter was then pulled through the tract. Peel-away sheath was placed over the stiff wire, and then the catheter was passed through the peel-away sheath. Peel-away sheath was removed.  Tip the catheter was positioned at the superior cavoatrial junction. Aspiration of the ports on the Palindrome catheter proved that the catheter was nonfunctional. Positioning of the catheter at multiple sites at the right atrium and superior cavoatrial junction prove to be nonfunctional.  Catheter was removed over a stiff wire which was subsequently advanced into the IVC. Balloon dilation of the right atrium was performed with and 8 mm balloon through a 10 French sheath in the tissue tract. Once the balloon was removed a new 23 cm tip to cuff Palindrome catheter was placed with the tip positioned at several positions at the superior cavoatrial junction and the right atrium. The catheter was nonfunctional.  At this time the tunneled right IJ catheter was exchanged over a wire, for the possibility of a catheter associated thrombus as the obstruction to flow of the left-sided HD catheter. This is dictated under different heading.  The left IJ catheter site was abandoned at this point with removal of the catheter and sterile dressing placed at the left IJ puncture site and left chest wall.  The right femoral vein was selected for a new catheter site.  The right femoral  region was then prepped with chlorhexidine in a sterile fashion, and a sterile drape was applied covering the operative field. Maximum barrier sterile technique with sterile gowns and gloves were used for the procedure.  After creating a small venotomy incision, a micropuncture kit was utilized to access the right femoral vein under direct, real-time ultrasound guidance after the overlying soft tissues were anesthetized with 1% lidocaine with epinephrine. Ultrasound image documentation was performed.  Once the micropuncture set was introduced, the inner dilator wire removed and a wire was navigated into the iliac vein. The inner dilator was removed with the wire and an 035 Bentson wire was advanced into the IVC. Soft tissue tract dilation was performed. A 44 cm tip to cuff hemodialysis catheter was estimated.  1% lidocaine was used to anesthetize the skin and subcutaneous tissues lateral to the femoral puncture site, and a small incision was made with 11 blade scalpel. The catheter was then back tunneled through the soft tissues. Catheter was then pulled through the tract. Peel-away sheath  was placed over the wire, and then the catheter was passed through the peel-away sheath. Peel-away sheath was removed.  The 44 cm tip to cuff catheter was positioned just below the diaphragm and the red and blue port were aspirated. The catheter was nonfunctional.  Catheter was removed over wire with a 10 French 25 cm sheath placed over the wire. A pigtail catheter was then advanced over the wire and an IVC cavagram was performed.  A 33 cm tip to cuff catheter was selected to place in the mid IVC. This was placed over a stiff wire once the sheath was removed through a peel-away.  This was proven to be functional in the mid IVC.  Patient tolerated the procedure well and remained hemodynamically stable throughout.  No complications were encountered and no significant blood loss was encountered.  Final catheter positioning was  confirmed and documented with a spot radiographic image. The catheter aspirates and flushes normally. The catheter was flushed with appropriate volume heparin dwells.  The catheter exit site was secured with a 0-Prolene retention suture. Venotomy was closed with Derma bond. Dressings were applied. The patient tolerated the procedure well without immediate post procedural complication.  IMPRESSION: Status post placement of right femoral vein tunneled hemodialysis catheter. This catheter is a 33 cm tip to cuff position in the mid IVC and is proven to be functional. Catheter is ready for use.  Before placement of the right femoral vein catheter, a left IJ approach 23 cm tip to cuff catheter was placed which was nonfunctional, as well as a right femoral catheter measuring 44 cm tip to cuff, which was nonfunctional. Presumably this was related to patient anatomy of the right heart and hepatic IVC.  Signed,  Dulcy Fanny. Earleen Newport, DO  [facility]  [facility] Radiology  PLAN: For future catheter placement, is useful to know that the catheter placed from left IJ approach proved to be non-functional at multiple positions within the right heart, although this remains a possible option. In addition, a catheter positioned at the hepatic IVC was also proved to be nonfunctional on today's study, potentially related to the size of the IVC.   Electronically Signed   By: Corrie Mckusick D.O.   On: 12/07/2014 13:00   Ir US Guide Vasc Access Right  12/07/2014   CLINICAL DATA:  39 year old female presents for initiation of hemodialysis, and placement of tunneled hemodialysis catheter.  A previous tunneled right central catheter is in place from right IJ approach and [place] left IJ was initially selected for hemodialysis catheter placement.  Nonfunctioning hemodialysis catheter from left IJ approach necessitated exchange of the right-sided IJ central catheter for trouble shooting purposes.  EXAM: ULTRASOUND  AND FLUOROSCOPIC GUIDED RIGHT TUNNELED CATHETER EXCHANGE.  FLUOROSCOPY TIME:  11 MINUTES 30 SECONDS  TECHNIQUE: The procedure, risks, benefits, and alternatives were explained to the patient. Questions regarding the procedure were encouraged and answered. The patient understands and consents to the procedure.  The patient is position in the supine position on the fluoroscopy table. Right neck and right upper chest were prepped and draped in the usual sterile fashion with sterile gloves counts and usual sterile technique. 1% lidocaine was used for local anesthesia.  For trouble shooting purposes the right tunneled IJ central catheter was exchanged over a wire. The new catheter was trimmed at 21 cm length and confirmed in position within the right atrium after replacement.  Catheter was sutured in position with retention sutures.  After exchange of this  catheter the case was continued with removal of left IJ hemodialysis catheter and placement of a right femoral line for hemodialysis initiation.  Patient tolerated the procedure well and remained hemodynamically stable throughout.  No complications were encountered and no significant blood loss was encountered.  COMPLICATIONS: None  IMPRESSION: Exchange of right IJ central catheter was performed during placement of a hemodialysis catheter for trouble shooting purposes when the initial left IJ approach HD catheter would not function.  Upon completion of exchange of the tunneled central catheter from right IJ approach, the catheter is functioning.  Signed,  Dulcy Fanny. Earleen Newport, DO  [facility]  [facility] Radiology   Electronically Signed   By: Corrie Mckusick D.O.   On: 12/07/2014 12:30   Medications: I have reviewed the patient's current medications. Scheduled Meds: . amLODipine  10 mg Oral BID  . atorvastatin  20 mg Oral q1800  . calcium acetate  1,334 mg Oral TID WC  . cephALEXin  250 mg Oral Q12H  . cyclobenzaprine  10 mg Oral  QHS  . famotidine  40 mg Oral Daily  . heparin  5,000 Units Subcutaneous 3 times per day  . hydrALAZINE  10 mg Oral 3 times per day  . metoprolol succinate  50 mg Oral Daily  . mycophenolate  1,500 mg Oral BID  . predniSONE  20 mg Oral TID  . 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 Patient has femoral HD catheter and will go for dialysis. Does endorse poor appetite, dysgusia, insomnia, nausea. - Continue cephalexin 269m BID, renally dosed for 4 days (12/10/14) for Klebsiella pneumoniae on urine Cx - Plan for HD today PM - Continue prednisone 288mPO TID - Continue Cellcept 150062mO BID - Continue zofran 4mg71mT q6hrs PRN - Continue PhosLo 1334mg8mWM - Repeat AM renal function panel, CBC - Nephrology consulted, following pt and recs appreciated  Hyperkalemia: Follow up renal function panel, dialysis today - Cardiac monitoring - Continue sodium bicarbonate 650mg 63mID  HTN: SBPs resistant to CCB and BB therapy, hydralazine started at 10mg P19mD with modest improvement in BP. Will hold off on dose increase until checking pre- and post- hemodialysis vitals. - Amlodipine 10mg PO1m - Continue Metoprolol succinate 50mg PO 71my - Continue hydralazine 10mg TID 21mk and flank pain: Pain present since renal biopsy. States this contributes to her insomnia. - Percocet 5-325mg q6hrs51m - Will schedule Flexeril 10mg qHS to51mt  SVT: s/p ablation 2014, has some recurrent palpitations in recent months, currently holter monitor on board. AOCD: Last Hgb 9.5 slightly decreased from 10.2. HLD: Atorvastatin 20mg PO  FEN39mnal diet, fluid restricted DVT ppx: Rudolph heparin FULL CODE  Dispo: Disposition deferred until improvement in clinical  condition. Patient starting on hemodialysis and will require several days before stable for discharge.  The patient does have a current PCP (Betti Reese,Lin Landsmans [facility]  The patient does not have transportation limitations that hinder transportation to clinic appointments.   LOS: 4 days   Ulla Mckiernan WCollier [place], 1:49 PM

## 2014-12-07 NOTE — Procedures (Signed)
Interventional Radiology Procedure Note  Procedure: Placement of a right femoral HD tunneled cathete 33cm tip to cuff.   Tip is positioned within the mid IVC and catheter is ready for immediate use.   Findings:  First placement of Left IJ HD catheter, 23 cm tip-to-cuff.  3 separate position within the right atrium and cavoatrial junction proved to be non-functional.  HD catheter remained non-functional after balloon angioplasty of presumed clot/fibrin sheath of right atrium.  HD cath remained non functional after exchange of right IJ tunneled central catheter. Left IJ catheter abandoned for femoral catheter.  Complications: None Recommendations:  - Ok to use catheter.  - Do not submerge   - Routine line care   Signed,  Dulcy Fanny. Earleen Newport, DO

## 2014-12-07 NOTE — Progress Notes (Signed)
Changed dressing to left neck soaked 4x4 gauze with bloody  Drainage.

## 2014-12-07 NOTE — Progress Notes (Signed)
Subjective:  BP has been high still- K better- BUN stable but poor- She admits that she is likely having uremic symptoms- received PC today and plan is for first HD today  Objective Vital signs in last 24 hours: Filed Vitals:   12/07/14 1036 12/07/14 1039 12/07/14 1047 12/07/14 1054  BP: 180/91 180/92 183/86 184/89  Pulse: 97 94 93 104  Temp:      TempSrc:      Resp: 8 13 12 17   Height:      Weight:      SpO2: 96%      Weight change:   Intake/Output Summary (Last 24 hours) at 12/07/14 1117 Last data filed at 12/06/14 1440  Gross per 24 hour  Intake     20 ml  Output      0 ml  Net     20 ml    Assessment/Plan: 39 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 Her GFR  has changed with the change in creatinine from the 6's to the 8's. Also with BUN in the 150's. There could be some chronic indications/symptoms for dialysis and I have told her this. She is agreeable to anything that might help.  The biopsy suggest that this could be reversible so would continue with immune suppressing therapy for now but will support with HD.s/p PC today and first HD, second HD tomorrow.   It is too soon to tell if her RI will be reversible- ie if dialysis support will continue to be needed after leaving hospital.  I will declare her ESRD for op dialysis purposes however 2. Renal hematoma after biopsy - per imaging does not seem to be worsening- hgb falling some 3. HTN- overall is high- maybe due to steroids. On amlodipine and toprol-  toprol increased to 50 daily and now hydralazine added.  Volume is likely playing a role as well.  Therefore HD should help 4. Anemia - stable and better than where it was immediately after biopsy   5. Hyperkalemia- on low K diet- is better today 6. UTI- on rocephin per primary team- culture pending - symptoms improved  7. Metabolic acidosis- started sodium bicarb but should resolve with HD as well  8. Hyperphos- will start phoslo     [place] Herd A    Labs: Basic Metabolic Panel:  Recent Labs Lab 12/05/14 0442 12/05/14 1650 12/06/14 0515  NA 135 136 134*  K 6.0* 4.6 4.6  CL 107 109 105  CO2 14* 12* 13*  GLUCOSE 157* 131* 155*  BUN 148* 153* 154*  CREATININE 8.04* 8.22* 8.08*  CALCIUM 8.1* 8.1* 8.0*  PHOS 10.1*  --  11.0*   Liver Function Tests:  Recent Labs Lab 12/04/14 0538 12/05/14 0442 12/06/14 0515  AST 19  --   --   ALT 18  --   --   ALKPHOS 48  --   --   BILITOT 0.3  --   --   PROT 6.6  --   --   ALBUMIN 3.0* 2.9* 3.0*   No results for input(s): LIPASE, AMYLASE in the last 168 hours. No results for input(s): AMMONIA in the last 168 hours. CBC:  Recent Labs Lab 12/03/14 1239 12/03/14 1247 12/04/14 0538 12/06/14 0515  WBC 12.6*  --  10.6* 12.7*  NEUTROABS 11.5*  --  10.3* 12.2*  HGB 10.5* 10.9* 10.2* 9.5*  HCT 29.8* 32.0* 29.9* 28.3*  MCV 84.2  --  85.9 84.2  PLT 335  --  404* 413*   Cardiac Enzymes: No results for input(s): CKTOTAL, CKMB, CKMBINDEX, TROPONINI in the last 168 hours. CBG: No results for input(s): GLUCAP in the last 168 hours.  Iron Studies: No results for input(s): IRON, TIBC, TRANSFERRIN, FERRITIN in the last 72 hours. Studies/Results: No results found. Medications: Infusions:    Scheduled Medications: . amLODipine  10 mg Oral BID  . atorvastatin  20 mg Oral q1800  . calcium acetate  1,334 mg Oral TID WC  . cephALEXin  250 mg Oral Q12H  . chlorhexidine      . cyclobenzaprine  10 mg Oral QHS  . famotidine  40 mg Oral Daily  . fentaNYL      . heparin      . heparin  5,000 Units Subcutaneous 3 times per day  . hydrALAZINE  10 mg Oral 3 times per day  . lidocaine      . lidocaine      . metoprolol  succinate  50 mg Oral Daily  . midazolam      . mycophenolate  1,500 mg Oral BID  . predniSONE  20 mg Oral TID  . sodium bicarbonate  650 mg Oral TID  . sodium chloride  3 mL Intravenous Q12H    have reviewed scheduled and prn medications.  Physical Exam: General: NAD Heart: RRR Lungs: clear Abdomen: soft, non tender Extremities: now with edema    12/07/2014,11:17 AM  LOS: 4 days

## 2014-12-07 NOTE — Progress Notes (Signed)
Patient came back from dialysis, alert and oriented. Rt fem. HD catheter stained marked. Made comfortable in bed and offered her something to eat.

## 2014-12-08 LAB — RENAL FUNCTION PANEL
ANION GAP: 14 (ref 5–15)
ANION GAP: 17 — AB (ref 5–15)
Albumin: 2.7 g/dL — ABNORMAL LOW (ref 3.5–5.0)
Albumin: 2.9 g/dL — ABNORMAL LOW (ref 3.5–5.0)
BUN: 129 mg/dL — ABNORMAL HIGH (ref 6–20)
BUN: 130 mg/dL — ABNORMAL HIGH (ref 6–20)
CALCIUM: 8.1 mg/dL — AB (ref 8.9–10.3)
CALCIUM: 8.3 mg/dL — AB (ref 8.9–10.3)
CHLORIDE: 106 mmol/L (ref 101–111)
CO2: 18 mmol/L — AB (ref 22–32)
CO2: 16 mmol/L — AB (ref 22–32)
Chloride: 102 mmol/L (ref 101–111)
Creatinine, Ser: 6.38 mg/dL — ABNORMAL HIGH (ref 0.44–1.00)
Creatinine, Ser: 6.63 mg/dL — ABNORMAL HIGH (ref 0.44–1.00)
GFR calc Af Amer: 8 mL/min — ABNORMAL LOW (ref >60)
GFR calc non Af Amer: 8 mL/min — ABNORMAL LOW (ref >60)
GFR calc non Af Amer: 7 mL/min — ABNORMAL LOW (ref >60)
GFR, EST AFRICAN AMERICAN: 9 mL/min — AB (ref >60)
GLUCOSE: 137 mg/dL — AB (ref 65–99)
Glucose, Bld: 126 mg/dL — ABNORMAL HIGH (ref 65–99)
POTASSIUM: 4.5 mmol/L (ref 3.5–5.1)
POTASSIUM: 4.6 mmol/L (ref 3.5–5.1)
Phosphorus: 10.9 mg/dL — ABNORMAL HIGH (ref 2.5–4.6)
Phosphorus: 11.9 mg/dL — ABNORMAL HIGH (ref 2.5–4.6)
SODIUM: 135 mmol/L (ref 135–145)
Sodium: 138 mmol/L (ref 135–145)

## 2014-12-08 LAB — CBC WITH DIFFERENTIAL/PLATELET
BASOS ABS: 0 10*3/uL (ref 0.0–0.1)
BASOS PCT: 0 % (ref 0–1)
Eosinophils Absolute: 0 10*3/uL (ref 0.0–0.7)
Eosinophils Relative: 0 % (ref 0–5)
HEMATOCRIT: 21.5 % — AB (ref 36.0–46.0)
HEMOGLOBIN: 7.5 g/dL — AB (ref 12.0–15.0)
Lymphocytes Relative: 1 % — ABNORMAL LOW (ref 12–46)
Lymphs Abs: 0.1 10*3/uL — ABNORMAL LOW (ref 0.7–4.0)
MCH: 28.6 pg (ref 26.0–34.0)
MCHC: 34.9 g/dL (ref 30.0–36.0)
MCV: 82.1 fL (ref 78.0–100.0)
MONO ABS: 0.4 10*3/uL (ref 0.1–1.0)
Monocytes Relative: 4 % (ref 3–12)
NEUTROS ABS: 10.6 10*3/uL — AB (ref 1.7–7.7)
NEUTROS PCT: 95 % — AB (ref 43–77)
Platelets: 128 10*3/uL — ABNORMAL LOW (ref 150–400)
RBC: 2.62 MIL/uL — AB (ref 3.87–5.11)
RDW: 13.9 % (ref 11.5–15.5)
WBC: 11.1 10*3/uL — AB (ref 4.0–10.5)

## 2014-12-08 LAB — CBC
HEMATOCRIT: 20.6 % — AB (ref 36.0–46.0)
HEMOGLOBIN: 7.1 g/dL — AB (ref 12.0–15.0)
MCH: 28.4 pg (ref 26.0–34.0)
MCHC: 34.5 g/dL (ref 30.0–36.0)
MCV: 82.4 fL (ref 78.0–100.0)
Platelets: 134 10*3/uL — ABNORMAL LOW (ref 150–400)
RBC: 2.5 MIL/uL — ABNORMAL LOW (ref 3.87–5.11)
RDW: 14.1 % (ref 11.5–15.5)
WBC: 12.3 10*3/uL — ABNORMAL HIGH (ref 4.0–10.5)

## 2014-12-08 LAB — HEPATITIS B SURFACE ANTIBODY,QUALITATIVE: HEP B S AB: NONREACTIVE

## 2014-12-08 LAB — HEPATITIS B CORE ANTIBODY, TOTAL: Hep B Core Total Ab: NEGATIVE

## 2014-12-08 LAB — HEPATITIS B SURFACE ANTIGEN: Hepatitis B Surface Ag: NEGATIVE

## 2014-12-08 LAB — PREPARE RBC (CROSSMATCH)

## 2014-12-08 LAB — ALT: ALT: 26 U/L (ref 14–54)

## 2014-12-08 MED ORDER — SODIUM CHLORIDE 0.9 % IV SOLN
Freq: Once | INTRAVENOUS | Status: AC
  Administered 2014-12-18: 13:00:00 via INTRAVENOUS

## 2014-12-08 MED ORDER — SODIUM CHLORIDE 0.9 % IV SOLN
0.3000 ug/kg | Freq: Once | INTRAVENOUS | Status: AC
  Administered 2014-12-08: 34.4 ug via INTRAVENOUS
  Filled 2014-12-08: qty 8.6

## 2014-12-08 MED ORDER — DARBEPOETIN ALFA 100 MCG/0.5ML IJ SOSY
PREFILLED_SYRINGE | INTRAMUSCULAR | Status: AC
  Filled 2014-12-08: qty 0.5

## 2014-12-08 MED ORDER — HEPARIN SODIUM (PORCINE) 1000 UNIT/ML DIALYSIS
20.0000 [IU]/kg | INTRAMUSCULAR | Status: DC | PRN
  Filled 2014-12-08: qty 3

## 2014-12-08 MED ORDER — DARBEPOETIN ALFA 100 MCG/0.5ML IJ SOSY
100.0000 ug | PREFILLED_SYRINGE | INTRAMUSCULAR | Status: DC
  Administered 2014-12-08: 100 ug via INTRAVENOUS
  Filled 2014-12-08: qty 0.5

## 2014-12-08 NOTE — Progress Notes (Signed)
Pressure held on all sites for 20 minutes and pressure dressings applied.  MD to assess.  Pt. Placed on bedrest and educated on pressure to right groin area with C&DB / straining with femoral HD cath.

## 2014-12-08 NOTE — Progress Notes (Signed)
Patient unable to void on bedpan.  Bladder scan reveals greater than 550 ml in bladder.  Order obtained for I&O cath with 700 ml. Returns.  Patient states feels much better.  Tol. Procedure well.  Some vaginal bleeding noted.  No hematuria noted

## 2014-12-08 NOTE — Progress Notes (Signed)
New bleeding noted from Rt. Fem. HD cath site, Right IJ, and left old cath site.  Hgb. Down to 7.5.  Lt. Flank area bruised and swollen.  MD notified re:  Low hgb.  Will notify to change HD cath dressing.

## 2014-12-08 NOTE — Progress Notes (Signed)
Subjective: Underwent first hemodialysis session last night with R femoral catheter. She has started bleeding from all IV access sites PICC, LIJ attempted catheter site, R femoral HD catheter. Pressure dressings put in place and bed rest initiated, with adequate hemostasis achieved. She feels extremely poorly, and has a large amount of anxiety about her bleeding.  Objective: Vital signs in last 24 hours: Filed Vitals:   12/07/14 1930 12/07/14 1947 12/07/14 2102 12/08/14 0551  BP: 149/96 152/93 171/88 156/81  Pulse: 106 107 93 84  Temp:  97.2 F (36.2 C) 98.6 F (37 C) 97.8 F (36.6 C)  TempSrc:  Oral Oral Oral  Resp:  _0 Height:      Weight:  114.6 kg (252 lb 10.4 oz)  115 kg (253 lb 8.5 oz)  SpO2:  97% 100% 100%   Weight change:   Intake/Output Summary (Last 24 hours) at 12/08/14 1142 Last data filed at 12/08/14 1055  Gross per 24 hour  Intake    340 ml  Output    988 ml  Net   -648 ml   GENERAL- alert, orientedx3, NAD HEENT- Atraumatic, oral mucosa appears moist CARDIAC- RRR, no murmurs, rubs or gallops. RESP- CTAB, no wheezes or crackles. ABDOMEN- No left sided abdominal TTP on exam, no guarding or rebound, normoactive bowel sounds EXTREMITIES- 1+ pedal edema b/l SKIN- Warm, dry PSYCH- Anxious, calmed down with reassurance  Lab Results: Basic Metabolic Panel:  Recent Labs Lab 12/07/14 1707 12/08/14 0525  NA 137 138  K 4.8 4.5  CL 109 106  CO2 12* 18*  GLUCOSE 122* 126*  BUN 170* 129*  CREATININE 8.34* 6.38*  CALCIUM 8.3* 8.1*  PHOS 12.3* 10.9*   Liver Function Tests:  Recent Labs Lab 12/04/14 0538  12/07/14 1707 12/08/14 0525  AST 19  --   --   --   ALT 18  --  26  --   ALKPHOS 48  --   --   --   BILITOT 0.3  --   --   --   PROT 6.6  --   --   --   ALBUMIN 3.0*  < > 3.1* 2.7*  < > = values in this interval not displayed. No results for input(s): LIPASE, AMYLASE in the last 168 hours. No results for input(s): AMMONIA in the last 168  hours. CBC:  Recent Labs Lab 12/06/14 0515 12/07/14 1707 12/08/14 0525  WBC 12.7* 17.9* 11.1*  NEUTROABS 12.2*  --  10.6*  HGB 9.5* 9.3* 7.5*  HCT 28.3* 27.3* 21.5*  MCV 84.2 83.5 82.1  PLT 413* 373 128*   Cardiac Enzymes: No results for input(s): CKTOTAL, CKMB, CKMBINDEX, TROPONINI in the last 168 hours. BNP: No results for input(s): PROBNP in the last 168 hours. D-Dimer: No results for input(s): DDIMER in the last 168 hours. CBG: No results for input(s): GLUCAP in the last 168 hours. Hemoglobin A1C: No results for input(s): HGBA1C in the last 168 hours. Fasting Lipid Panel: No results for input(s): CHOL, HDL, LDLCALC, TRIG, CHOLHDL, LDLDIRECT in the last 168 hours. Thyroid Function Tests: No results for input(s): TSH, T4TOTAL, FREET4, T3FREE, THYROIDAB in the last 168 hours. Coagulation:  Recent Labs Lab 12/06/14 1440  LABPROT 13.9  INR 1.05   Anemia Panel: No results for input(s): VITAMINB12, FOLATE, FERRITIN, TIBC, IRON, RETICCTPCT in the last 168 hours. Urine Drug Screen: Drugs of Abuse     Component Value Date/Time   LABOPIA NONE DETECTED 07/21/2012 0215  COCAINSCRNUR NONE DETECTED 07/21/2012 0215   LABBENZ NONE DETECTED 07/21/2012 0215   AMPHETMU NONE DETECTED 07/21/2012 0215   THCU NONE DETECTED 07/21/2012 0215   LABBARB NONE DETECTED 07/21/2012 0215    Alcohol Level: No results for input(s): ETH in the last 168 hours. Urinalysis:  Recent Labs Lab 12/03/14 1152  COLORURINE YELLOW  LABSPEC 1.019  PHURINE 5.0  GLUCOSEU NEGATIVE  HGBUR LARGE*  BILIRUBINUR NEGATIVE  KETONESUR NEGATIVE  PROTEINUR >300*  UROBILINOGEN 0.2  NITRITE NEGATIVE  LEUKOCYTESUR MODERATE*    Micro Results: Recent Results (from the past 240 hour(s))  Urine culture     Status: None   Collection Time: 12/03/14  1:44 PM  Result Value Ref Range Status   Specimen Description URINE, CLEAN CATCH  Final   Special Requests Normal  Final   Culture >=100,000 COLONIES/mL  KLEBSIELLA PNEUMONIAE  Final   Report Status 12/06/2014 FINAL  Final   Organism ID, Bacteria KLEBSIELLA PNEUMONIAE  Final      Susceptibility   Klebsiella pneumoniae - MIC*    AMPICILLIN 4 RESISTANT Resistant     CEFAZOLIN <=4 SENSITIVE Sensitive     CEFTRIAXONE <=1 SENSITIVE Sensitive     CIPROFLOXACIN <=0.25 SENSITIVE Sensitive     GENTAMICIN <=1 SENSITIVE Sensitive     IMIPENEM <=0.25 SENSITIVE Sensitive     NITROFURANTOIN 64 INTERMEDIATE Intermediate     TRIMETH/SULFA <=20 SENSITIVE Sensitive     AMPICILLIN/SULBACTAM 4 SENSITIVE Sensitive     PIP/TAZO 8 SENSITIVE Sensitive     * >=100,000 COLONIES/mL KLEBSIELLA PNEUMONIAE   Studies/Results: Ir Pta Venous Left  12/07/2014   INDICATION: 39 year old female with end-stage renal disease has been referred for tunneled hemodialysis catheter placement for initiation of hemodialysis.  She has had a prior right-sided tunneled IJ catheter placed for medications. The left IJ approach was selected.  EXAM: TUNNELED CENTRAL VENOUS HEMODIALYSIS CATHETER PLACEMENT WITH ULTRASOUND AND FLUOROSCOPIC GUIDANCE  ULTRASOUND GUIDED ACCESS OF LEFT INTERNAL JUGULAR VEIN AND RIGHT COMMON FEMORAL VEIN  MEDICATIONS: Ancef 2 gm IV; The IV antibiotic was given in an appropriate time interval prior to skin puncture.  CONTRAST:  None  ANESTHESIA/SEDATION: Versed 4.0 mg IV; Fentanyl 200 mcg IV  Total Moderate Sedation Time  One hundred forty-one minutes.  FLUOROSCOPY TIME:  11 minutes 30 seconds  COMPLICATIONS: None  PROCEDURE: Informed written consent was obtained from the patient after a discussion of the risks, benefits, and alternatives to treatment. Questions regarding the procedure were encouraged and answered. The left neck and chest were prepped with chlorhexidine in a sterile fashion, and a sterile drape was applied covering the operative field. Maximum barrier sterile technique with sterile gowns and gloves were used for the procedure. A timeout was performed prior to  the initiation of the procedure.  After creating a small venotomy incision, a micropuncture kit was utilized to access the left internal jugular vein under direct, real-time ultrasound guidance after the overlying soft tissues were anesthetized with 1% lidocaine with epinephrine. Ultrasound image documentation was performed.  Once the micropuncture set was introduced, the inner dilator wire removed and a wire was navigated into the right heart. Serial dilation of the soft tissue tract was performed over the wire, and a Kumpe the catheter was advanced over the wire navigating the wire into the IVC. Once the catheter was advanced the IVC the wire was exchanged for stiff wire. Before removing the first wire a length of 23 cm tip to cuff was estimated.  1% lidocaine was used to  anesthetize the chest, and a small incision was made with 11 blade scalpel. The catheter was then back tunneled through the soft tissues to the IJ puncture site. Catheter was then pulled through the tract. Peel-away sheath was placed over the stiff wire, and then the catheter was passed through the peel-away sheath. Peel-away sheath was removed.  Tip the catheter was positioned at the superior cavoatrial junction. Aspiration of the ports on the Palindrome catheter proved that the catheter was nonfunctional. Positioning of the catheter at multiple sites at the right atrium and superior cavoatrial junction prove to be nonfunctional.  Catheter was removed over a stiff wire which was subsequently advanced into the IVC. Balloon dilation of the right atrium was performed with and 8 mm balloon through a 10 French sheath in the tissue tract. Once the balloon was removed a new 23 cm tip to cuff Palindrome catheter was placed with the tip positioned at several positions at the superior cavoatrial junction and the right atrium. The catheter was nonfunctional.  At this time the tunneled right IJ catheter was exchanged over a wire, for the possibility of a  catheter associated thrombus as the obstruction to flow of the left-sided HD catheter. This is dictated under different heading.  The left IJ catheter site was abandoned at this point with removal of the catheter and sterile dressing placed at the left IJ puncture site and left chest wall.  The right femoral vein was selected for a new catheter site.  The right femoral region was then prepped with chlorhexidine in a sterile fashion, and a sterile drape was applied covering the operative field. Maximum barrier sterile technique with sterile gowns and gloves were used for the procedure.  After creating a small venotomy incision, a micropuncture kit was utilized to access the right femoral vein under direct, real-time ultrasound guidance after the overlying soft tissues were anesthetized with 1% lidocaine with epinephrine. Ultrasound image documentation was performed.  Once the micropuncture set was introduced, the inner dilator wire removed and a wire was navigated into the iliac vein. The inner dilator was removed with the wire and an 035 Bentson wire was advanced into the IVC. Soft tissue tract dilation was performed. A 44 cm tip to cuff hemodialysis catheter was estimated.  1% lidocaine was used to anesthetize the skin and subcutaneous tissues lateral to the femoral puncture site, and a small incision was made with 11 blade scalpel. The catheter was then back tunneled through the soft tissues. Catheter was then pulled through the tract. Peel-away sheath was placed over the wire, and then the catheter was passed through the peel-away sheath. Peel-away sheath was removed.  The 44 cm tip to cuff catheter was positioned just below the diaphragm and the red and blue port were aspirated. The catheter was nonfunctional.  Catheter was removed over wire with a 10 French 25 cm sheath placed over the wire. A pigtail catheter was then advanced over the wire and an IVC cavagram was performed.  A 33 cm tip to cuff catheter was  selected to place in the mid IVC. This was placed over a stiff wire once the sheath was removed through a peel-away.  This was proven to be functional in the mid IVC.  Patient tolerated the procedure well and remained hemodynamically stable throughout.  No complications were encountered and no significant blood loss was encountered.  Final catheter positioning was confirmed and documented with a spot radiographic image. The catheter aspirates and flushes normally. The catheter was  flushed with appropriate volume heparin dwells.  The catheter exit site was secured with a 0-Prolene retention suture. Venotomy was closed with Derma bond. Dressings were applied. The patient tolerated the procedure well without immediate post procedural complication.  IMPRESSION: Status post placement of right femoral vein tunneled hemodialysis catheter. This catheter is a 33 cm tip to cuff position in the mid IVC and is proven to be functional. Catheter is ready for use.  Before placement of the right femoral vein catheter, a left IJ approach 23 cm tip to cuff catheter was placed which was nonfunctional, as well as a right femoral catheter measuring 44 cm tip to cuff, which was nonfunctional. Presumably this was related to patient anatomy of the right heart and hepatic IVC.  Signed,  Dulcy Fanny. Earleen Newport, DO  [facility]  [facility]  PLAN: For future catheter placement, is useful to know that the catheter placed from left IJ approach proved to be non-functional at multiple positions within the right heart, although this remains a possible option. In addition, a catheter positioned at the hepatic IVC was also proved to be nonfunctional on today's study, potentially related to the size of the IVC.   Electronically Signed   By: Corrie Mckusick D.O.   On: 12/07/2014 13:00   Ir Fluoro Guide Cv Line Left  12/07/2014   INDICATION: 39 year old female with end-stage renal disease has been referred for  tunneled hemodialysis catheter placement for initiation of hemodialysis.  She has had a prior right-sided tunneled IJ catheter placed for medications. The left IJ approach was selected.  EXAM: TUNNELED CENTRAL VENOUS HEMODIALYSIS CATHETER PLACEMENT WITH ULTRASOUND AND FLUOROSCOPIC GUIDANCE  ULTRASOUND GUIDED ACCESS OF LEFT INTERNAL JUGULAR VEIN AND RIGHT COMMON FEMORAL VEIN  MEDICATIONS: Ancef 2 gm IV; The IV antibiotic was given in an appropriate time interval prior to skin puncture.  CONTRAST:  None  ANESTHESIA/SEDATION: Versed 4.0 mg IV; Fentanyl 200 mcg IV  Total Moderate Sedation Time  One hundred forty-one minutes.  FLUOROSCOPY TIME:  11 minutes 30 seconds  COMPLICATIONS: None  PROCEDURE: Informed written consent was obtained from the patient after a discussion of the risks, benefits, and alternatives to treatment. Questions regarding the procedure were encouraged and answered. The left neck and chest were prepped with chlorhexidine in a sterile fashion, and a sterile drape was applied covering the operative field. Maximum barrier sterile technique with sterile gowns and gloves were used for the procedure. A timeout was performed prior to the initiation of the procedure.  After creating a small venotomy incision, a micropuncture kit was utilized to access the left internal jugular vein under direct, real-time ultrasound guidance after the overlying soft tissues were anesthetized with 1% lidocaine with epinephrine. Ultrasound image documentation was performed.  Once the micropuncture set was introduced, the inner dilator wire removed and a wire was navigated into the right heart. Serial dilation of the soft tissue tract was performed over the wire, and a Kumpe the catheter was advanced over the wire navigating the wire into the IVC. Once the catheter was advanced the IVC the wire was exchanged for stiff wire. Before removing the first wire a length of 23 cm tip to cuff was estimated.  1% lidocaine was used to  anesthetize the chest, and a small incision was made with 11 blade scalpel. The catheter was then back tunneled through the soft tissues to the IJ puncture site. Catheter was then pulled through the tract. Peel-away sheath was placed over the stiff  wire, and then the catheter was passed through the peel-away sheath. Peel-away sheath was removed.  Tip the catheter was positioned at the superior cavoatrial junction. Aspiration of the ports on the Palindrome catheter proved that the catheter was nonfunctional. Positioning of the catheter at multiple sites at the right atrium and superior cavoatrial junction prove to be nonfunctional.  Catheter was removed over a stiff wire which was subsequently advanced into the IVC. Balloon dilation of the right atrium was performed with and 8 mm balloon through a 10 French sheath in the tissue tract. Once the balloon was removed a new 23 cm tip to cuff Palindrome catheter was placed with the tip positioned at several positions at the superior cavoatrial junction and the right atrium. The catheter was nonfunctional.  At this time the tunneled right IJ catheter was exchanged over a wire, for the possibility of a catheter associated thrombus as the obstruction to flow of the left-sided HD catheter. This is dictated under different heading.  The left IJ catheter site was abandoned at this point with removal of the catheter and sterile dressing placed at the left IJ puncture site and left chest wall.  The right femoral vein was selected for a new catheter site.  The right femoral region was then prepped with chlorhexidine in a sterile fashion, and a sterile drape was applied covering the operative field. Maximum barrier sterile technique with sterile gowns and gloves were used for the procedure.  After creating a small venotomy incision, a micropuncture kit was utilized to access the right femoral vein under direct, real-time ultrasound guidance after the overlying soft tissues were  anesthetized with 1% lidocaine with epinephrine. Ultrasound image documentation was performed.  Once the micropuncture set was introduced, the inner dilator wire removed and a wire was navigated into the iliac vein. The inner dilator was removed with the wire and an 035 Bentson wire was advanced into the IVC. Soft tissue tract dilation was performed. A 44 cm tip to cuff hemodialysis catheter was estimated.  1% lidocaine was used to anesthetize the skin and subcutaneous tissues lateral to the femoral puncture site, and a small incision was made with 11 blade scalpel. The catheter was then back tunneled through the soft tissues. Catheter was then pulled through the tract. Peel-away sheath was placed over the wire, and then the catheter was passed through the peel-away sheath. Peel-away sheath was removed.  The 44 cm tip to cuff catheter was positioned just below the diaphragm and the red and blue port were aspirated. The catheter was nonfunctional.  Catheter was removed over wire with a 10 French 25 cm sheath placed over the wire. A pigtail catheter was then advanced over the wire and an IVC cavagram was performed.  A 33 cm tip to cuff catheter was selected to place in the mid IVC. This was placed over a stiff wire once the sheath was removed through a peel-away.  This was proven to be functional in the mid IVC.  Patient tolerated the procedure well and remained hemodynamically stable throughout.  No complications were encountered and no significant blood loss was encountered.  Final catheter positioning was confirmed and documented with a spot radiographic image. The catheter aspirates and flushes normally. The catheter was flushed with appropriate volume heparin dwells.  The catheter exit site was secured with a 0-Prolene retention suture. Venotomy was closed with Derma bond. Dressings were applied. The patient tolerated the procedure well without immediate post procedural complication.  IMPRESSION: Status post  placement of right femoral vein tunneled hemodialysis catheter. This catheter is a 33 cm tip to cuff position in the mid IVC and is proven to be functional. Catheter is ready for use.  Before placement of the right femoral vein catheter, a left IJ approach 23 cm tip to cuff catheter was placed which was nonfunctional, as well as a right femoral catheter measuring 44 cm tip to cuff, which was nonfunctional. Presumably this was related to patient anatomy of the right heart and hepatic IVC.  Signed,  Dulcy Fanny. Earleen Newport, DO  [facility]  [facility]  PLAN: For future catheter placement, is useful to know that the catheter placed from left IJ approach proved to be non-functional at multiple positions within the right heart, although this remains a possible option. In addition, a catheter positioned at the hepatic IVC was also proved to be nonfunctional on today's study, potentially related to the size of the IVC.   Electronically Signed   By: Corrie Mckusick D.O.   On: 12/07/2014 13:00   Ir Fluoro Guide Cv Line Right  12/07/2014   INDICATION: 39 year old female with end-stage renal disease has been referred for tunneled hemodialysis catheter placement for initiation of hemodialysis.  She has had a prior right-sided tunneled IJ catheter placed for medications. The left IJ approach was selected.  EXAM: TUNNELED CENTRAL VENOUS HEMODIALYSIS CATHETER PLACEMENT WITH ULTRASOUND AND FLUOROSCOPIC GUIDANCE  ULTRASOUND GUIDED ACCESS OF LEFT INTERNAL JUGULAR VEIN AND RIGHT COMMON FEMORAL VEIN  MEDICATIONS: Ancef 2 gm IV; The IV antibiotic was given in an appropriate time interval prior to skin puncture.  CONTRAST:  None  ANESTHESIA/SEDATION: Versed 4.0 mg IV; Fentanyl 200 mcg IV  Total Moderate Sedation Time  One hundred forty-one minutes.  FLUOROSCOPY TIME:  11 minutes 30 seconds  COMPLICATIONS: None  PROCEDURE: Informed written consent was obtained from the patient after a discussion of  the risks, benefits, and alternatives to treatment. Questions regarding the procedure were encouraged and answered. The left neck and chest were prepped with chlorhexidine in a sterile fashion, and a sterile drape was applied covering the operative field. Maximum barrier sterile technique with sterile gowns and gloves were used for the procedure. A timeout was performed prior to the initiation of the procedure.  After creating a small venotomy incision, a micropuncture kit was utilized to access the left internal jugular vein under direct, real-time ultrasound guidance after the overlying soft tissues were anesthetized with 1% lidocaine with epinephrine. Ultrasound image documentation was performed.  Once the micropuncture set was introduced, the inner dilator wire removed and a wire was navigated into the right heart. Serial dilation of the soft tissue tract was performed over the wire, and a Kumpe the catheter was advanced over the wire navigating the wire into the IVC. Once the catheter was advanced the IVC the wire was exchanged for stiff wire. Before removing the first wire a length of 23 cm tip to cuff was estimated.  1% lidocaine was used to anesthetize the chest, and a small incision was made with 11 blade scalpel. The catheter was then back tunneled through the soft tissues to the IJ puncture site. Catheter was then pulled through the tract. Peel-away sheath was placed over the stiff wire, and then the catheter was passed through the peel-away sheath. Peel-away sheath was removed.  Tip the catheter was positioned at the superior cavoatrial junction. Aspiration of the ports on the Palindrome catheter proved that the catheter was nonfunctional. Positioning of the  catheter at multiple sites at the right atrium and superior cavoatrial junction prove to be nonfunctional.  Catheter was removed over a stiff wire which was subsequently advanced into the IVC. Balloon dilation of the right atrium was performed with  and 8 mm balloon through a 10 French sheath in the tissue tract. Once the balloon was removed a new 23 cm tip to cuff Palindrome catheter was placed with the tip positioned at several positions at the superior cavoatrial junction and the right atrium. The catheter was nonfunctional.  At this time the tunneled right IJ catheter was exchanged over a wire, for the possibility of a catheter associated thrombus as the obstruction to flow of the left-sided HD catheter. This is dictated under different heading.  The left IJ catheter site was abandoned at this point with removal of the catheter and sterile dressing placed at the left IJ puncture site and left chest wall.  The right femoral vein was selected for a new catheter site.  The right femoral region was then prepped with chlorhexidine in a sterile fashion, and a sterile drape was applied covering the operative field. Maximum barrier sterile technique with sterile gowns and gloves were used for the procedure.  After creating a small venotomy incision, a micropuncture kit was utilized to access the right femoral vein under direct, real-time ultrasound guidance after the overlying soft tissues were anesthetized with 1% lidocaine with epinephrine. Ultrasound image documentation was performed.  Once the micropuncture set was introduced, the inner dilator wire removed and a wire was navigated into the iliac vein. The inner dilator was removed with the wire and an 035 Bentson wire was advanced into the IVC. Soft tissue tract dilation was performed. A 44 cm tip to cuff hemodialysis catheter was estimated.  1% lidocaine was used to anesthetize the skin and subcutaneous tissues lateral to the femoral puncture site, and a small incision was made with 11 blade scalpel. The catheter was then back tunneled through the soft tissues. Catheter was then pulled through the tract. Peel-away sheath was placed over the wire, and then the catheter was passed through the peel-away sheath.  Peel-away sheath was removed.  The 44 cm tip to cuff catheter was positioned just below the diaphragm and the red and blue port were aspirated. The catheter was nonfunctional.  Catheter was removed over wire with a 10 French 25 cm sheath placed over the wire. A pigtail catheter was then advanced over the wire and an IVC cavagram was performed.  A 33 cm tip to cuff catheter was selected to place in the mid IVC. This was placed over a stiff wire once the sheath was removed through a peel-away.  This was proven to be functional in the mid IVC.  Patient tolerated the procedure well and remained hemodynamically stable throughout.  No complications were encountered and no significant blood loss was encountered.  Final catheter positioning was confirmed and documented with a spot radiographic image. The catheter aspirates and flushes normally. The catheter was flushed with appropriate volume heparin dwells.  The catheter exit site was secured with a 0-Prolene retention suture. Venotomy was closed with Derma bond. Dressings were applied. The patient tolerated the procedure well without immediate post procedural complication.  IMPRESSION: Status post placement of right femoral vein tunneled hemodialysis catheter. This catheter is a 33 cm tip to cuff position in the mid IVC and is proven to be functional. Catheter is ready for use.  Before placement of the right femoral vein catheter, a  left IJ approach 23 cm tip to cuff catheter was placed which was nonfunctional, as well as a right femoral catheter measuring 44 cm tip to cuff, which was nonfunctional. Presumably this was related to patient anatomy of the right heart and hepatic IVC.  Signed,  Dulcy Fanny. Earleen Newport, DO  [facility]  [facility]  PLAN: For future catheter placement, is useful to know that the catheter placed from left IJ approach proved to be non-functional at multiple positions within the right heart, although this  remains a possible option. In addition, a catheter positioned at the hepatic IVC was also proved to be nonfunctional on today's study, potentially related to the size of the IVC.   Electronically Signed   By: Corrie Mckusick D.O.   On: 12/07/2014 13:00   Ir Fluoro Guide Cv Line Right  12/07/2014   CLINICAL DATA:  39 year old female presents for initiation of hemodialysis, and placement of tunneled hemodialysis catheter.  A previous tunneled right central catheter is in place from right IJ approach and [place] left IJ was initially selected for hemodialysis catheter placement.  Nonfunctioning hemodialysis catheter from left IJ approach necessitated exchange of the right-sided IJ central catheter for trouble shooting purposes.  EXAM: ULTRASOUND AND FLUOROSCOPIC GUIDED RIGHT TUNNELED CATHETER EXCHANGE.  FLUOROSCOPY TIME:  11 MINUTES 30 SECONDS  TECHNIQUE: The procedure, risks, benefits, and alternatives were explained to the patient. Questions regarding the procedure were encouraged and answered. The patient understands and consents to the procedure.  The patient is position in the supine position on the fluoroscopy table. Right neck and right upper chest were prepped and draped in the usual sterile fashion with sterile gloves counts and usual sterile technique. 1% lidocaine was used for local anesthesia.  For trouble shooting purposes the right tunneled IJ central catheter was exchanged over a wire. The new catheter was trimmed at 21 cm length and confirmed in position within the right atrium after replacement.  Catheter was sutured in position with retention sutures.  After exchange of this catheter the case was continued with removal of left IJ hemodialysis catheter and placement of a right femoral line for hemodialysis initiation.  Patient tolerated the procedure well and remained hemodynamically stable throughout.  No complications were encountered and no significant blood loss was encountered.  COMPLICATIONS: None   IMPRESSION: Exchange of right IJ central catheter was performed during placement of a hemodialysis catheter for trouble shooting purposes when the initial left IJ approach HD catheter would not function.  Upon completion of exchange of the tunneled central catheter from right IJ approach, the catheter is functioning.  Signed,  Dulcy Fanny. Earleen Newport, DO  [facility]  [facility] Radiology   Electronically Signed   By: Corrie Mckusick D.O.   On: 12/07/2014 12:30   Ir Removal Tun Cv Cath W/o Fl  12/08/2014   INDICATION: 40 year old female with end-stage renal disease has been referred for tunneled hemodialysis catheter placement for initiation of hemodialysis.  She has had a prior right-sided tunneled IJ catheter placed for medications. The left IJ approach was selected.  EXAM: TUNNELED CENTRAL VENOUS HEMODIALYSIS CATHETER PLACEMENT WITH ULTRASOUND AND FLUOROSCOPIC GUIDANCE  ULTRASOUND GUIDED ACCESS OF LEFT INTERNAL JUGULAR VEIN AND RIGHT COMMON FEMORAL VEIN  MEDICATIONS: Ancef 2 gm IV; The IV antibiotic was given in an appropriate time interval prior to skin puncture.  CONTRAST:  None  ANESTHESIA/SEDATION: Versed 4.0 mg IV; Fentanyl 200 mcg IV  Total Moderate Sedation Time  One hundred  forty-one minutes.  FLUOROSCOPY TIME:  11 minutes 30 seconds  COMPLICATIONS: None  PROCEDURE: Informed written consent was obtained from the patient after a discussion of the risks, benefits, and alternatives to treatment. Questions regarding the procedure were encouraged and answered. The left neck and chest were prepped with chlorhexidine in a sterile fashion, and a sterile drape was applied covering the operative field. Maximum barrier sterile technique with sterile gowns and gloves were used for the procedure. A timeout was performed prior to the initiation of the procedure.  After creating a small venotomy incision, a micropuncture kit was utilized to access the left internal jugular vein under  direct, real-time ultrasound guidance after the overlying soft tissues were anesthetized with 1% lidocaine with epinephrine. Ultrasound image documentation was performed.  Once the micropuncture set was introduced, the inner dilator wire removed and a wire was navigated into the right heart. Serial dilation of the soft tissue tract was performed over the wire, and a Kumpe the catheter was advanced over the wire navigating the wire into the IVC. Once the catheter was advanced the IVC the wire was exchanged for stiff wire. Before removing the first wire a length of 23 cm tip to cuff was estimated.  1% lidocaine was used to anesthetize the chest, and a small incision was made with 11 blade scalpel. The catheter was then back tunneled through the soft tissues to the IJ puncture site. Catheter was then pulled through the tract. Peel-away sheath was placed over the stiff wire, and then the catheter was passed through the peel-away sheath. Peel-away sheath was removed.  Tip the catheter was positioned at the superior cavoatrial junction. Aspiration of the ports on the Palindrome catheter proved that the catheter was nonfunctional. Positioning of the catheter at multiple sites at the right atrium and superior cavoatrial junction prove to be nonfunctional.  Catheter was removed over a stiff wire which was subsequently advanced into the IVC. Balloon dilation of the right atrium was performed with and 8 mm balloon through a 10 French sheath in the tissue tract. Once the balloon was removed a new 23 cm tip to cuff Palindrome catheter was placed with the tip positioned at several positions at the superior cavoatrial junction and the right atrium. The catheter was nonfunctional.  At this time the tunneled right IJ catheter was exchanged over a wire, for the possibility of a catheter associated thrombus as the obstruction to flow of the left-sided HD catheter. This is dictated under different heading.  The left IJ catheter site  was abandoned at this point with removal of the catheter and sterile dressing placed at the left IJ puncture site and left chest wall.  The right femoral vein was selected for a new catheter site.  The right femoral region was then prepped with chlorhexidine in a sterile fashion, and a sterile drape was applied covering the operative field. Maximum barrier sterile technique with sterile gowns and gloves were used for the procedure.  After creating a small venotomy incision, a micropuncture kit was utilized to access the right femoral vein under direct, real-time ultrasound guidance after the overlying soft tissues were anesthetized with 1% lidocaine with epinephrine. Ultrasound image documentation was performed.  Once the micropuncture set was introduced, the inner dilator wire removed and a wire was navigated into the iliac vein. The inner dilator was removed with the wire and an 035 Bentson wire was advanced into the IVC. Soft tissue tract dilation was performed. A 44 cm tip to cuff  hemodialysis catheter was estimated.  1% lidocaine was used to anesthetize the skin and subcutaneous tissues lateral to the femoral puncture site, and a small incision was made with 11 blade scalpel. The catheter was then back tunneled through the soft tissues. Catheter was then pulled through the tract. Peel-away sheath was placed over the wire, and then the catheter was passed through the peel-away sheath. Peel-away sheath was removed.  The 44 cm tip to cuff catheter was positioned just below the diaphragm and the red and blue port were aspirated. The catheter was nonfunctional.  Catheter was removed over wire with a 10 French 25 cm sheath placed over the wire. A pigtail catheter was then advanced over the wire and an IVC cavagram was performed.  A 33 cm tip to cuff catheter was selected to place in the mid IVC. This was placed over a stiff wire once the sheath was removed through a peel-away.  This was proven to be functional in the  mid IVC.  Patient tolerated the procedure well and remained hemodynamically stable throughout.  No complications were encountered and no significant blood loss was encountered.  Final catheter positioning was confirmed and documented with a spot radiographic image. The catheter aspirates and flushes normally. The catheter was flushed with appropriate volume heparin dwells.  The catheter exit site was secured with a 0-Prolene retention suture. Venotomy was closed with Derma bond. Dressings were applied. The patient tolerated the procedure well without immediate post procedural complication.  IMPRESSION: Status post placement of right femoral vein tunneled hemodialysis catheter. This catheter is a 33 cm tip to cuff position in the mid IVC and is proven to be functional. Catheter is ready for use.  Before placement of the right femoral vein catheter, a left IJ approach 23 cm tip to cuff catheter was placed which was nonfunctional, as well as a right femoral catheter measuring 44 cm tip to cuff, which was nonfunctional. Presumably this was related to patient anatomy of the right heart and hepatic IVC.  Signed,  Dulcy Fanny. Earleen Newport, DO  [facility]  [facility]iology  PLAN: For future catheter placement, is useful to know that the catheter placed from left IJ approach proved to be non-functional at multiple positions within the right heart, although this remains a possible option. In addition, a catheter positioned at the hepatic IVC was also proved to be nonfunctional on today's study, potentially related to the size of the IVC.   Electronically Signed   By: Corrie Mckusick D.O.   On: 12/07/2014 13:00   Ir US Guide Vasc Access Left  12/07/2014   INDICATION: 39 year old female with end-stage renal disease has been referred for tunneled hemodialysis catheter placement for initiation of hemodialysis.  She has had a prior right-sided tunneled IJ catheter placed for medications. The left  IJ approach was selected.  EXAM: TUNNELED CENTRAL VENOUS HEMODIALYSIS CATHETER PLACEMENT WITH ULTRASOUND AND FLUOROSCOPIC GUIDANCE  ULTRASOUND GUIDED ACCESS OF LEFT INTERNAL JUGULAR VEIN AND RIGHT COMMON FEMORAL VEIN  MEDICATIONS: Ancef 2 gm IV; The IV antibiotic was given in an appropriate time interval prior to skin puncture.  CONTRAST:  None  ANESTHESIA/SEDATION: Versed 4.0 mg IV; Fentanyl 200 mcg IV  Total Moderate Sedation Time  One hundred forty-one minutes.  FLUOROSCOPY TIME:  11 minutes 30 seconds  COMPLICATIONS: None  PROCEDURE: Informed written consent was obtained from the patient after a discussion of the risks, benefits, and alternatives to treatment. Questions regarding the procedure were encouraged and answered.  The left neck and chest were prepped with chlorhexidine in a sterile fashion, and a sterile drape was applied covering the operative field. Maximum barrier sterile technique with sterile gowns and gloves were used for the procedure. A timeout was performed prior to the initiation of the procedure.  After creating a small venotomy incision, a micropuncture kit was utilized to access the left internal jugular vein under direct, real-time ultrasound guidance after the overlying soft tissues were anesthetized with 1% lidocaine with epinephrine. Ultrasound image documentation was performed.  Once the micropuncture set was introduced, the inner dilator wire removed and a wire was navigated into the right heart. Serial dilation of the soft tissue tract was performed over the wire, and a Kumpe the catheter was advanced over the wire navigating the wire into the IVC. Once the catheter was advanced the IVC the wire was exchanged for stiff wire. Before removing the first wire a length of 23 cm tip to cuff was estimated.  1% lidocaine was used to anesthetize the chest, and a small incision was made with 11 blade scalpel. The catheter was then back tunneled through the soft tissues to the IJ puncture site.  Catheter was then pulled through the tract. Peel-away sheath was placed over the stiff wire, and then the catheter was passed through the peel-away sheath. Peel-away sheath was removed.  Tip the catheter was positioned at the superior cavoatrial junction. Aspiration of the ports on the Palindrome catheter proved that the catheter was nonfunctional. Positioning of the catheter at multiple sites at the right atrium and superior cavoatrial junction prove to be nonfunctional.  Catheter was removed over a stiff wire which was subsequently advanced into the IVC. Balloon dilation of the right atrium was performed with and 8 mm balloon through a 10 French sheath in the tissue tract. Once the balloon was removed a new 23 cm tip to cuff Palindrome catheter was placed with the tip positioned at several positions at the superior cavoatrial junction and the right atrium. The catheter was nonfunctional.  At this time the tunneled right IJ catheter was exchanged over a wire, for the possibility of a catheter associated thrombus as the obstruction to flow of the left-sided HD catheter. This is dictated under different heading.  The left IJ catheter site was abandoned at this point with removal of the catheter and sterile dressing placed at the left IJ puncture site and left chest wall.  The right femoral vein was selected for a new catheter site.  The right femoral region was then prepped with chlorhexidine in a sterile fashion, and a sterile drape was applied covering the operative field. Maximum barrier sterile technique with sterile gowns and gloves were used for the procedure.  After creating a small venotomy incision, a micropuncture kit was utilized to access the right femoral vein under direct, real-time ultrasound guidance after the overlying soft tissues were anesthetized with 1% lidocaine with epinephrine. Ultrasound image documentation was performed.  Once the micropuncture set was introduced, the inner dilator wire  removed and a wire was navigated into the iliac vein. The inner dilator was removed with the wire and an 035 Bentson wire was advanced into the IVC. Soft tissue tract dilation was performed. A 44 cm tip to cuff hemodialysis catheter was estimated.  1% lidocaine was used to anesthetize the skin and subcutaneous tissues lateral to the femoral puncture site, and a small incision was made with 11 blade scalpel. The catheter was then back tunneled through the soft tissues.  Catheter was then pulled through the tract. Peel-away sheath was placed over the wire, and then the catheter was passed through the peel-away sheath. Peel-away sheath was removed.  The 44 cm tip to cuff catheter was positioned just below the diaphragm and the red and blue port were aspirated. The catheter was nonfunctional.  Catheter was removed over wire with a 10 French 25 cm sheath placed over the wire. A pigtail catheter was then advanced over the wire and an IVC cavagram was performed.  A 33 cm tip to cuff catheter was selected to place in the mid IVC. This was placed over a stiff wire once the sheath was removed through a peel-away.  This was proven to be functional in the mid IVC.  Patient tolerated the procedure well and remained hemodynamically stable throughout.  No complications were encountered and no significant blood loss was encountered.  Final catheter positioning was confirmed and documented with a spot radiographic image. The catheter aspirates and flushes normally. The catheter was flushed with appropriate volume heparin dwells.  The catheter exit site was secured with a 0-Prolene retention suture. Venotomy was closed with Derma bond. Dressings were applied. The patient tolerated the procedure well without immediate post procedural complication.  IMPRESSION: Status post placement of right femoral vein tunneled hemodialysis catheter. This catheter is a 33 cm tip to cuff position in the mid IVC and is proven to be functional. Catheter  is ready for use.  Before placement of the right femoral vein catheter, a left IJ approach 23 cm tip to cuff catheter was placed which was nonfunctional, as well as a right femoral catheter measuring 44 cm tip to cuff, which was nonfunctional. Presumably this was related to patient anatomy of the right heart and hepatic IVC.  Signed,  Dulcy Fanny. Earleen Newport, DO  [facility]  [facility] Radiology  PLAN: For future catheter placement, is useful to know that the catheter placed from left IJ approach proved to be non-functional at multiple positions within the right heart, although this remains a possible option. In addition, a catheter positioned at the hepatic IVC was also proved to be nonfunctional on today's study, potentially related to the size of the IVC.   Electronically Signed   By: Corrie Mckusick D.O.   On: 12/07/2014 13:00   Ir US Guide Vasc Access Right  12/07/2014   CLINICAL DATA:  39 year old female presents for initiation of hemodialysis, and placement of tunneled hemodialysis catheter.  A previous tunneled right central catheter is in place from right IJ approach and [place] left IJ was initially selected for hemodialysis catheter placement.  Nonfunctioning hemodialysis catheter from left IJ approach necessitated exchange of the right-sided IJ central catheter for trouble shooting purposes.  EXAM: ULTRASOUND AND FLUOROSCOPIC GUIDED RIGHT TUNNELED CATHETER EXCHANGE.  FLUOROSCOPY TIME:  11 MINUTES 30 SECONDS  TECHNIQUE: The procedure, risks, benefits, and alternatives were explained to the patient. Questions regarding the procedure were encouraged and answered. The patient understands and consents to the procedure.  The patient is position in the supine position on the fluoroscopy table. Right neck and right upper chest were prepped and draped in the usual sterile fashion with sterile gloves counts and usual sterile technique. 1% lidocaine was used for local anesthesia.  For  trouble shooting purposes the right tunneled IJ central catheter was exchanged over a wire. The new catheter was trimmed at 21 cm length and confirmed in position within the right atrium after replacement.  Catheter was sutured in  position with retention sutures.  After exchange of this catheter the case was continued with removal of left IJ hemodialysis catheter and placement of a right femoral line for hemodialysis initiation.  Patient tolerated the procedure well and remained hemodynamically stable throughout.  No complications were encountered and no significant blood loss was encountered.  COMPLICATIONS: None  IMPRESSION: Exchange of right IJ central catheter was performed during placement of a hemodialysis catheter for trouble shooting purposes when the initial left IJ approach HD catheter would not function.  Upon completion of exchange of the tunneled central catheter from right IJ approach, the catheter is functioning.  Signed,  Dulcy Fanny. Earleen Newport, DO  [facility]  [facility] Radiology   Electronically Signed   By: Corrie Mckusick D.O.   On: 12/07/2014 12:30   Medications: I have reviewed the patient's current medications. Scheduled Meds: . sodium chloride   Intravenous Once  . amLODipine  10 mg Oral BID  . atorvastatin  20 mg Oral q1800  . calcium acetate  1,334 mg Oral TID WC  . cephALEXin  250 mg Oral Q12H  . cyclobenzaprine  10 mg Oral QHS  . darbepoetin (ARANESP) injection - DIALYSIS  100 mcg Intravenous Q Sat-HD  . desmopressin (DDAVP) IV  0.3 mcg/kg Intravenous Once  . famotidine  40 mg Oral Daily  . heparin  5,000 Units Subcutaneous 3 times per day  . hydrALAZINE  10 mg Oral 3 times per day  . metoprolol succinate  50 mg Oral Daily  . mycophenolate  1,500 mg Oral BID  . predniSONE  20 mg Oral TID  . 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 Anemia worsening 2/2 this acute blood loss. - Continue cephalexin 225m BID, renally dosed for 4 days (12/10/14) for Klebsiella pneumoniae on urine Cx - Continue prednisone 238mPO TID - Continue Cellcept 150053mO BID - Continue zofran 4mg20mT q6hrs PRN - Continue PhosLo 1334mg69mWM - Repeat AM renal function panel, CBC - Hemodialysis per Nephrology, following pt and recs appreciated  Hyperkalemia: Follow up renal function panel, dialysis today - Cardiac monitoring - Continue sodium bicarbonate 650mg 26mID  HTN: SBPs resistant to CCB and BB therapy, hydralazine started at 10mg P21mD with modest improvement in BP. BP stayed high with HD so far. - Continue Amlodipine 10mg PO50m - Continue Metoprolol succinate 50mg PO 26my - Increase Hydralazine to 25mg TID 53mk and flank pain: Pain present since renal biopsy. States this contributes to her insomnia. Flexeril no improvement in sleep last night. 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 Last Hgb 7.5 down from 9.3. Bleeding from multiple sites, oozing bleed rate after pressure bandages placed. - Recheck blood count today PM - Tranfuse with hemodialysis today - DDAVP for probable uremic platelet dysfunction - Bed rest activity level  SVT: s/p ablation 2014, has some recurrent palpitations in recent months, currently holter monitor on board. HLD: Atorvastatin 20mg PO  FEN12mnal diet, fluid restricted DVT ppx: [place] heparin FULL CODE  Dispo: Disposition deferred until improvement in clinical condition. Patient starting on hemodialysis and will require several days before stable  for discharge.  The patient  does have a current PCP Lin Landsman, MD) and does not need an [facility] hospital follow-up appointment after discharge.  The patient does not have transportation limitations that hinder transportation to clinic appointments.   LOS: 5 days   Collier Salina, MD 12/08/2014, 11:42 AM

## 2014-12-08 NOTE — Progress Notes (Signed)
Patient bleed from her Lt Fem. HD site and lt side of her chest. MD made aware. Dressing got changed on the lt fem. Site and MD got the left chest opening sutured. Pressure was put on about 20 minutes and bleeding stopped. Patient made comfortable in bed and being monitored closely.

## 2014-12-08 NOTE — Progress Notes (Signed)
Pt. C/o of some lightheadness.  VSS - Blood pressure 170/84, pulse 100, temperature 98.9 F (37.2 C), temperature source Oral, resp. rate 18, height 5\' 3"  (1.6 m), weight 115 kg (253 lb 8.5 oz), last menstrual period 11/17/2014, SpO2 100 %., R/A.  Will continue to monitor.  Will transport pt. To HD for treatment via bed.  Alphonzo Lemmings, RN

## 2014-12-08 NOTE — Progress Notes (Signed)
Internal Medicine Attending  Date: 12/08/2014  Patient name: [name] Medical record number: [id number] Date of birth: [birth year] Age: 38 y.o. Gender: female  I saw and evaluated the patient. I reviewed the resident's note by Dr. Benjamine Mola and I agree with the resident's findings and plans as documented in his progress note.  She is oozing from both the left subclavian site of the dialysis catheter attempt as well as the right femoral dialysis catheter site. 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 She will undergo her second session of hemodialysis today in hopes of improving her other uremic symptoms. 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

## 2014-12-08 NOTE — Progress Notes (Signed)
Subjective:  BP has been high still- had first HD last night- has femoral HD catheter which I did not realize- has been bleeding from all sites overnight- feels terrible  Objective Vital signs in last 24 hours: Filed Vitals:   12/07/14 1930 12/07/14 1947 12/07/14 2102 12/08/14 0551  BP: 149/96 152/93 171/88 156/81  Pulse: 106 107 93 84  Temp:  97.2 F (36.2 C) 98.6 F (37 C) 97.8 F (36.6 C)  TempSrc:  Oral Oral Oral  Resp:  _0 Height:      Weight:  114.6 kg (252 lb 10.4 oz)  115 kg (253 lb 8.5 oz)  SpO2:  97% 100% 100%   Weight change:   Intake/Output Summary (Last 24 hours) at 12/08/14 0956 Last data filed at 12/07/14 2130  Gross per 24 hour  Intake    320 ml  Output    988 ml  Net   -668 ml    Assessment/Plan: 39 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 This hosp with continued progression with BUN in mid 100's and uremic symptoms so dialysis was started.   The biopsy suggest that this could be reversible so would continue with immune suppressing therapy for now but will support with HD.s/p PC and first HD 9/9 , second HD today.   It is too soon to tell if her RI will be reversible- ie if dialysis support will continue to be needed after leaving hospital.  I will declare her ESRD for op dialysis purposes however.  Is not good that upper cath not able to be placed- may not be good prognosis of perm access placement 2. Renal hematoma after biopsy - per imaging does not seem to be worsening- hgb falling some- will transfuse with HD today  3. HTN- overall is high- maybe due to steroids. On amlodipine and toprol-  toprol increased to 50 daily and now hydralazine added.  Volume is likely playing a role  as well.  Therefore HD should help 4. Anemia - worse- transfuse today- also add ESA 5. Hyperkalemia- resolved 6. UTI- on rocephin per primary team- culture pending - symptoms improved  7. Metabolic acidosis-  should resolve with HD as well  8. Hyperphos- have started phoslo - will check PTH 9. Bleeding uremic platelets ? Give dose of ddavp and continue with HD    [name] A    Labs: Basic Metabolic Panel:  Recent Labs Lab 12/06/14 0515 12/07/14 1707 12/08/14 0525  NA 134* 137 138  K 4.6 4.8 4.5  CL 105 109 106  CO2 13* 12* 18*  GLUCOSE 155* 122* 126*  BUN 154* 170* 129*  CREATININE 8.08* 8.34* 6.38*  CALCIUM 8.0* 8.3* 8.1*  PHOS 11.0* 12.3* 10.9*   Liver Function Tests:  Recent Labs Lab 12/04/14 0538  12/06/14 0515 12/07/14 1707 12/08/14 0525  AST 19  --   --   --   --   ALT 18  --   --  26  --   ALKPHOS 48  --   --   --   --   BILITOT 0.3  --   --   --   --   PROT 6.6  --   --   --   --  ALBUMIN 3.0*  < > 3.0* 3.1* 2.7*  < > = values in this interval not displayed. No results for input(s): LIPASE, AMYLASE in the last 168 hours. No results for input(s): AMMONIA in the last 168 hours. CBC:  Recent Labs Lab 12/03/14 1239  12/04/14 0538 12/06/14 0515 12/07/14 1707 12/08/14 0525  WBC 12.6*  --  10.6* 12.7* 17.9* 11.1*  NEUTROABS 11.5*  --  10.3* 12.2*  --  10.6*  HGB 10.5*  < > 10.2* 9.5* 9.3* 7.5*  HCT 29.8*  < > 29.9* 28.3* 27.3* 21.5*  MCV 84.2  --  85.9 84.2 83.5 82.1  PLT 335  --  404* 413* 373 128*  < > = values in this interval not displayed. Cardiac Enzymes: No results for input(s): CKTOTAL, CKMB, CKMBINDEX, TROPONINI in the last 168 hours. CBG: No results for input(s): GLUCAP in the last 168 hours.  Iron Studies: No results for input(s): IRON, TIBC, TRANSFERRIN, FERRITIN in the last 72 hours. Studies/Results: Ir Pta Venous Left  12/07/2014   INDICATION: 39 year old female with end-stage renal disease has been referred for  tunneled hemodialysis catheter placement for initiation of hemodialysis.  She has had a prior right-sided tunneled IJ catheter placed for medications. The left IJ approach was selected.  EXAM: TUNNELED CENTRAL VENOUS HEMODIALYSIS CATHETER PLACEMENT WITH ULTRASOUND AND FLUOROSCOPIC GUIDANCE  ULTRASOUND GUIDED ACCESS OF LEFT INTERNAL JUGULAR VEIN AND RIGHT COMMON FEMORAL VEIN  MEDICATIONS: Ancef 2 gm IV; The IV antibiotic was given in an appropriate time interval prior to skin puncture.  CONTRAST:  None  ANESTHESIA/SEDATION: Versed 4.0 mg IV; Fentanyl 200 mcg IV  Total Moderate Sedation Time  One hundred forty-one minutes.  FLUOROSCOPY TIME:  11 minutes 30 seconds  COMPLICATIONS: None  PROCEDURE: Informed written consent was obtained from the patient after a discussion of the risks, benefits, and alternatives to treatment. Questions regarding the procedure were encouraged and answered. The left neck and chest were prepped with chlorhexidine in a sterile fashion, and a sterile drape was applied covering the operative field. Maximum barrier sterile technique with sterile gowns and gloves were used for the procedure. A timeout was performed prior to the initiation of the procedure.  After creating a small venotomy incision, a micropuncture kit was utilized to access the left internal jugular vein under direct, real-time ultrasound guidance after the overlying soft tissues were anesthetized with 1% lidocaine with epinephrine. Ultrasound image documentation was performed.  Once the micropuncture set was introduced, the inner dilator wire removed and a wire was navigated into the right heart. Serial dilation of the soft tissue tract was performed over the wire, and a Kumpe the catheter was advanced over the wire navigating the wire into the IVC. Once the catheter was advanced the IVC the wire was exchanged for stiff wire. Before removing the first wire a length of 23 cm tip to cuff was estimated.  1% lidocaine was used to  anesthetize the chest, and a small incision was made with 11 blade scalpel. The catheter was then back tunneled through the soft tissues to the IJ puncture site. Catheter was then pulled through the tract. Peel-away sheath was placed over the stiff wire, and then the catheter was passed through the peel-away sheath. Peel-away sheath was removed.  Tip the catheter was positioned at the superior cavoatrial junction. Aspiration of the ports on the Palindrome catheter proved that the catheter was nonfunctional. Positioning of the catheter at multiple sites at the right atrium and superior cavoatrial junction prove  to be nonfunctional.  Catheter was removed over a stiff wire which was subsequently advanced into the IVC. Balloon dilation of the right atrium was performed with and 8 mm balloon through a 10 French sheath in the tissue tract. Once the balloon was removed a new 23 cm tip to cuff Palindrome catheter was placed with the tip positioned at several positions at the superior cavoatrial junction and the right atrium. The catheter was nonfunctional.  At this time the tunneled right IJ catheter was exchanged over a wire, for the possibility of a catheter associated thrombus as the obstruction to flow of the left-sided HD catheter. This is dictated under different heading.  The left IJ catheter site was abandoned at this point with removal of the catheter and sterile dressing placed at the left IJ puncture site and left chest wall.  The right femoral vein was selected for a new catheter site.  The right femoral region was then prepped with chlorhexidine in a sterile fashion, and a sterile drape was applied covering the operative field. Maximum barrier sterile technique with sterile gowns and gloves were used for the procedure.  After creating a small venotomy incision, a micropuncture kit was utilized to access the right femoral vein under direct, real-time ultrasound guidance after the overlying soft tissues were  anesthetized with 1% lidocaine with epinephrine. Ultrasound image documentation was performed.  Once the micropuncture set was introduced, the inner dilator wire removed and a wire was navigated into the iliac vein. The inner dilator was removed with the wire and an 035 Bentson wire was advanced into the IVC. Soft tissue tract dilation was performed. A 44 cm tip to cuff hemodialysis catheter was estimated.  1% lidocaine was used to anesthetize the skin and subcutaneous tissues lateral to the femoral puncture site, and a small incision was made with 11 blade scalpel. The catheter was then back tunneled through the soft tissues. Catheter was then pulled through the tract. Peel-away sheath was placed over the wire, and then the catheter was passed through the peel-away sheath. Peel-away sheath was removed.  The 44 cm tip to cuff catheter was positioned just below the diaphragm and the red and blue port were aspirated. The catheter was nonfunctional.  Catheter was removed over wire with a 10 French 25 cm sheath placed over the wire. A pigtail catheter was then advanced over the wire and an IVC cavagram was performed.  A 33 cm tip to cuff catheter was selected to place in the mid IVC. This was placed over a stiff wire once the sheath was removed through a peel-away.  This was proven to be functional in the mid IVC.  Patient tolerated the procedure well and remained hemodynamically stable throughout.  No complications were encountered and no significant blood loss was encountered.  Final catheter positioning was confirmed and documented with a spot radiographic image. The catheter aspirates and flushes normally. The catheter was flushed with appropriate volume heparin dwells.  The catheter exit site was secured with a 0-Prolene retention suture. Venotomy was closed with Derma bond. Dressings were applied. The patient tolerated the procedure well without immediate post procedural complication.  IMPRESSION: Status post  placement of right femoral vein tunneled hemodialysis catheter. This catheter is a 33 cm tip to cuff position in the mid IVC and is proven to be functional. Catheter is ready for use.  Before placement of the right femoral vein catheter, a left IJ approach 23 cm tip to cuff catheter was placed which was  nonfunctional, as well as a right femoral catheter measuring 44 cm tip to cuff, which was nonfunctional. Presumably this was related to patient anatomy of the right heart and hepatic IVC.  Signed,  Dulcy Fanny. Earleen Newport, DO  [facility]  [facility] Radiology  PLAN: For future catheter placement, is useful to know that the catheter placed from left IJ approach proved to be non-functional at multiple positions within the right heart, although this remains a possible option. In addition, a catheter positioned at the hepatic IVC was also proved to be nonfunctional on today's study, potentially related to the size of the IVC.   Electronically Signed   By: Corrie Mckusick D.O.   On: 12/07/2014 13:00   Ir Fluoro Guide Cv Line Left  12/07/2014   INDICATION: 39 year old female with end-stage renal disease has been referred for tunneled hemodialysis catheter placement for initiation of hemodialysis.  She has had a prior right-sided tunneled IJ catheter placed for medications. The left IJ approach was selected.  EXAM: TUNNELED CENTRAL VENOUS HEMODIALYSIS CATHETER PLACEMENT WITH ULTRASOUND AND FLUOROSCOPIC GUIDANCE  ULTRASOUND GUIDED ACCESS OF LEFT INTERNAL JUGULAR VEIN AND RIGHT COMMON FEMORAL VEIN  MEDICATIONS: Ancef 2 gm IV; The IV antibiotic was given in an appropriate time interval prior to skin puncture.  CONTRAST:  None  ANESTHESIA/SEDATION: Versed 4.0 mg IV; Fentanyl 200 mcg IV  Total Moderate Sedation Time  One hundred forty-one minutes.  FLUOROSCOPY TIME:  11 minutes 30 seconds  COMPLICATIONS: None  PROCEDURE: Informed written consent was obtained from the patient after a discussion of  the risks, benefits, and alternatives to treatment. Questions regarding the procedure were encouraged and answered. The left neck and chest were prepped with chlorhexidine in a sterile fashion, and a sterile drape was applied covering the operative field. Maximum barrier sterile technique with sterile gowns and gloves were used for the procedure. A timeout was performed prior to the initiation of the procedure.  After creating a small venotomy incision, a micropuncture kit was utilized to access the left internal jugular vein under direct, real-time ultrasound guidance after the overlying soft tissues were anesthetized with 1% lidocaine with epinephrine. Ultrasound image documentation was performed.  Once the micropuncture set was introduced, the inner dilator wire removed and a wire was navigated into the right heart. Serial dilation of the soft tissue tract was performed over the wire, and a Kumpe the catheter was advanced over the wire navigating the wire into the IVC. Once the catheter was advanced the IVC the wire was exchanged for stiff wire. Before removing the first wire a length of 23 cm tip to cuff was estimated.  1% lidocaine was used to anesthetize the chest, and a small incision was made with 11 blade scalpel. The catheter was then back tunneled through the soft tissues to the IJ puncture site. Catheter was then pulled through the tract. Peel-away sheath was placed over the stiff wire, and then the catheter was passed through the peel-away sheath. Peel-away sheath was removed.  Tip the catheter was positioned at the superior cavoatrial junction. Aspiration of the ports on the Palindrome catheter proved that the catheter was nonfunctional. Positioning of the catheter at multiple sites at the right atrium and superior cavoatrial junction prove to be nonfunctional.  Catheter was removed over a stiff wire which was subsequently advanced into the IVC. Balloon dilation of the right atrium was performed with  and 8 mm balloon through a 10 French sheath in the tissue tract. Once the  balloon was removed a new 23 cm tip to cuff Palindrome catheter was placed with the tip positioned at several positions at the superior cavoatrial junction and the right atrium. The catheter was nonfunctional.  At this time the tunneled right IJ catheter was exchanged over a wire, for the possibility of a catheter associated thrombus as the obstruction to flow of the left-sided HD catheter. This is dictated under different heading.  The left IJ catheter site was abandoned at this point with removal of the catheter and sterile dressing placed at the left IJ puncture site and left chest wall.  The right femoral vein was selected for a new catheter site.  The right femoral region was then prepped with chlorhexidine in a sterile fashion, and a sterile drape was applied covering the operative field. Maximum barrier sterile technique with sterile gowns and gloves were used for the procedure.  After creating a small venotomy incision, a micropuncture kit was utilized to access the right femoral vein under direct, real-time ultrasound guidance after the overlying soft tissues were anesthetized with 1% lidocaine with epinephrine. Ultrasound image documentation was performed.  Once the micropuncture set was introduced, the inner dilator wire removed and a wire was navigated into the iliac vein. The inner dilator was removed with the wire and an 035 Bentson wire was advanced into the IVC. Soft tissue tract dilation was performed. A 44 cm tip to cuff hemodialysis catheter was estimated.  1% lidocaine was used to anesthetize the skin and subcutaneous tissues lateral to the femoral puncture site, and a small incision was made with 11 blade scalpel. The catheter was then back tunneled through the soft tissues. Catheter was then pulled through the tract. Peel-away sheath was placed over the wire, and then the catheter was passed through the peel-away sheath.  Peel-away sheath was removed.  The 44 cm tip to cuff catheter was positioned just below the diaphragm and the red and blue port were aspirated. The catheter was nonfunctional.  Catheter was removed over wire with a 10 French 25 cm sheath placed over the wire. A pigtail catheter was then advanced over the wire and an IVC cavagram was performed.  A 33 cm tip to cuff catheter was selected to place in the mid IVC. This was placed over a stiff wire once the sheath was removed through a peel-away.  This was proven to be functional in the mid IVC.  Patient tolerated the procedure well and remained hemodynamically stable throughout.  No complications were encountered and no significant blood loss was encountered.  Final catheter positioning was confirmed and documented with a spot radiographic image. The catheter aspirates and flushes normally. The catheter was flushed with appropriate volume heparin dwells.  The catheter exit site was secured with a 0-Prolene retention suture. Venotomy was closed with Derma bond. Dressings were applied. The patient tolerated the procedure well without immediate post procedural complication.  IMPRESSION: Status post placement of right femoral vein tunneled hemodialysis catheter. This catheter is a 33 cm tip to cuff position in the mid IVC and is proven to be functional. Catheter is ready for use.  Before placement of the right femoral vein catheter, a left IJ approach 23 cm tip to cuff catheter was placed which was nonfunctional, as well as a right femoral catheter measuring 44 cm tip to cuff, which was nonfunctional. Presumably this was related to patient anatomy of the right heart and hepatic IVC.  Signed,  Dulcy Fanny. Earleen Newport, DO  [facility]  [facility] Radiology  PLAN: For future catheter placement, is useful to know that the catheter placed from left IJ approach proved to be non-functional at multiple positions within the right heart, although this  remains a possible option. In addition, a catheter positioned at the hepatic IVC was also proved to be nonfunctional on today's study, potentially related to the size of the IVC.   Electronically Signed   By: Corrie Mckusick D.O.   On: 12/07/2014 13:00   Ir Fluoro Guide Cv Line Right  12/07/2014   INDICATION: 39 year old female with end-stage renal disease has been referred for tunneled hemodialysis catheter placement for initiation of hemodialysis.  She has had a prior right-sided tunneled IJ catheter placed for medications. The left IJ approach was selected.  EXAM: TUNNELED CENTRAL VENOUS HEMODIALYSIS CATHETER PLACEMENT WITH ULTRASOUND AND FLUOROSCOPIC GUIDANCE  ULTRASOUND GUIDED ACCESS OF LEFT INTERNAL JUGULAR VEIN AND RIGHT COMMON FEMORAL VEIN  MEDICATIONS: Ancef 2 gm IV; The IV antibiotic was given in an appropriate time interval prior to skin puncture.  CONTRAST:  None  ANESTHESIA/SEDATION: Versed 4.0 mg IV; Fentanyl 200 mcg IV  Total Moderate Sedation Time  One hundred forty-one minutes.  FLUOROSCOPY TIME:  11 minutes 30 seconds  COMPLICATIONS: None  PROCEDURE: Informed written consent was obtained from the patient after a discussion of the risks, benefits, and alternatives to treatment. Questions regarding the procedure were encouraged and answered. The left neck and chest were prepped with chlorhexidine in a sterile fashion, and a sterile drape was applied covering the operative field. Maximum barrier sterile technique with sterile gowns and gloves were used for the procedure. A timeout was performed prior to the initiation of the procedure.  After creating a small venotomy incision, a micropuncture kit was utilized to access the left internal jugular vein under direct, real-time ultrasound guidance after the overlying soft tissues were anesthetized with 1% lidocaine with epinephrine. Ultrasound image documentation was performed.  Once the micropuncture set was introduced, the inner dilator wire removed and  a wire was navigated into the right heart. Serial dilation of the soft tissue tract was performed over the wire, and a Kumpe the catheter was advanced over the wire navigating the wire into the IVC. Once the catheter was advanced the IVC the wire was exchanged for stiff wire. Before removing the first wire a length of 23 cm tip to cuff was estimated.  1% lidocaine was used to anesthetize the chest, and a small incision was made with 11 blade scalpel. The catheter was then back tunneled through the soft tissues to the IJ puncture site. Catheter was then pulled through the tract. Peel-away sheath was placed over the stiff wire, and then the catheter was passed through the peel-away sheath. Peel-away sheath was removed.  Tip the catheter was positioned at the superior cavoatrial junction. Aspiration of the ports on the Palindrome catheter proved that the catheter was nonfunctional. Positioning of the catheter at multiple sites at the right atrium and superior cavoatrial junction prove to be nonfunctional.  Catheter was removed over a stiff wire which was subsequently advanced into the IVC. Balloon dilation of the right atrium was performed with and 8 mm balloon through a 10 French sheath in the tissue tract. Once the balloon was removed a new 23 cm tip to cuff Palindrome catheter was placed with the tip positioned at several positions at the superior cavoatrial junction and the right atrium. The catheter was nonfunctional.  At this time the tunneled right IJ catheter  was exchanged over a wire, for the possibility of a catheter associated thrombus as the obstruction to flow of the left-sided HD catheter. This is dictated under different heading.  The left IJ catheter site was abandoned at this point with removal of the catheter and sterile dressing placed at the left IJ puncture site and left chest wall.  The right femoral vein was selected for a new catheter site.  The right femoral region was then prepped with  chlorhexidine in a sterile fashion, and a sterile drape was applied covering the operative field. Maximum barrier sterile technique with sterile gowns and gloves were used for the procedure.  After creating a small venotomy incision, a micropuncture kit was utilized to access the right femoral vein under direct, real-time ultrasound guidance after the overlying soft tissues were anesthetized with 1% lidocaine with epinephrine. Ultrasound image documentation was performed.  Once the micropuncture set was introduced, the inner dilator wire removed and a wire was navigated into the iliac vein. The inner dilator was removed with the wire and an 035 Bentson wire was advanced into the IVC. Soft tissue tract dilation was performed. A 44 cm tip to cuff hemodialysis catheter was estimated.  1% lidocaine was used to anesthetize the skin and subcutaneous tissues lateral to the femoral puncture site, and a small incision was made with 11 blade scalpel. The catheter was then back tunneled through the soft tissues. Catheter was then pulled through the tract. Peel-away sheath was placed over the wire, and then the catheter was passed through the peel-away sheath. Peel-away sheath was removed.  The 44 cm tip to cuff catheter was positioned just below the diaphragm and the red and blue port were aspirated. The catheter was nonfunctional.  Catheter was removed over wire with a 10 French 25 cm sheath placed over the wire. A pigtail catheter was then advanced over the wire and an IVC cavagram was performed.  A 33 cm tip to cuff catheter was selected to place in the mid IVC. This was placed over a stiff wire once the sheath was removed through a peel-away.  This was proven to be functional in the mid IVC.  Patient tolerated the procedure well and remained hemodynamically stable throughout.  No complications were encountered and no significant blood loss was encountered.  Final catheter positioning was confirmed and documented with a  spot radiographic image. The catheter aspirates and flushes normally. The catheter was flushed with appropriate volume heparin dwells.  The catheter exit site was secured with a 0-Prolene retention suture. Venotomy was closed with Derma bond. Dressings were applied. The patient tolerated the procedure well without immediate post procedural complication.  IMPRESSION: Status post placement of right femoral vein tunneled hemodialysis catheter. This catheter is a 33 cm tip to cuff position in the mid IVC and is proven to be functional. Catheter is ready for use.  Before placement of the right femoral vein catheter, a left IJ approach 23 cm tip to cuff catheter was placed which was nonfunctional, as well as a right femoral catheter measuring 44 cm tip to cuff, which was nonfunctional. Presumably this was related to patient anatomy of the right heart and hepatic IVC.  Signed,  Dulcy Fanny. Earleen Newport, DO  [facility]  [facility]  PLAN: For future catheter placement, is useful to know that the catheter placed from left IJ approach proved to be non-functional at multiple positions within the right heart, although this remains a possible option. In addition, a  catheter positioned at the hepatic IVC was also proved to be nonfunctional on today's study, potentially related to the size of the IVC.   Electronically Signed   By: Corrie Mckusick D.O.   On: 12/07/2014 13:00   Ir Fluoro Guide Cv Line Right  12/07/2014   CLINICAL DATA:  39 year old female presents for initiation of hemodialysis, and placement of tunneled hemodialysis catheter.  A previous tunneled right central catheter is in place from right IJ approach and [place] left IJ was initially selected for hemodialysis catheter placement.  Nonfunctioning hemodialysis catheter from left IJ approach necessitated exchange of the right-sided IJ central catheter for trouble shooting purposes.  EXAM: ULTRASOUND AND FLUOROSCOPIC GUIDED RIGHT  TUNNELED CATHETER EXCHANGE.  FLUOROSCOPY TIME:  11 MINUTES 30 SECONDS  TECHNIQUE: The procedure, risks, benefits, and alternatives were explained to the patient. Questions regarding the procedure were encouraged and answered. The patient understands and consents to the procedure.  The patient is position in the supine position on the fluoroscopy table. Right neck and right upper chest were prepped and draped in the usual sterile fashion with sterile gloves counts and usual sterile technique. 1% lidocaine was used for local anesthesia.  For trouble shooting purposes the right tunneled IJ central catheter was exchanged over a wire. The new catheter was trimmed at 21 cm length and confirmed in position within the right atrium after replacement.  Catheter was sutured in position with retention sutures.  After exchange of this catheter the case was continued with removal of left IJ hemodialysis catheter and placement of a right femoral line for hemodialysis initiation.  Patient tolerated the procedure well and remained hemodynamically stable throughout.  No complications were encountered and no significant blood loss was encountered.  COMPLICATIONS: None  IMPRESSION: Exchange of right IJ central catheter was performed during placement of a hemodialysis catheter for trouble shooting purposes when the initial left IJ approach HD catheter would not function.  Upon completion of exchange of the tunneled central catheter from right IJ approach, the catheter is functioning.  Signed,  Dulcy Fanny. Earleen Newport, DO  [facility]  [facility]   Electronically Signed   By: Corrie Mckusick D.O.   On: 12/07/2014 12:30   Ir US Guide Vasc Access Left  12/07/2014   INDICATION: 39 year old female with end-stage renal disease has been referred for tunneled hemodialysis catheter placement for initiation of hemodialysis.  She has had a prior right-sided tunneled IJ catheter placed for medications. The  left IJ approach was selected.  EXAM: TUNNELED CENTRAL VENOUS HEMODIALYSIS CATHETER PLACEMENT WITH ULTRASOUND AND FLUOROSCOPIC GUIDANCE  ULTRASOUND GUIDED ACCESS OF LEFT INTERNAL JUGULAR VEIN AND RIGHT COMMON FEMORAL VEIN  MEDICATIONS: Ancef 2 gm IV; The IV antibiotic was given in an appropriate time interval prior to skin puncture.  CONTRAST:  None  ANESTHESIA/SEDATION: Versed 4.0 mg IV; Fentanyl 200 mcg IV  Total Moderate Sedation Time  One hundred forty-one minutes.  FLUOROSCOPY TIME:  11 minutes 30 seconds  COMPLICATIONS: None  PROCEDURE: Informed written consent was obtained from the patient after a discussion of the risks, benefits, and alternatives to treatment. Questions regarding the procedure were encouraged and answered. The left neck and chest were prepped with chlorhexidine in a sterile fashion, and a sterile drape was applied covering the operative field. Maximum barrier sterile technique with sterile gowns and gloves were used for the procedure. A timeout was performed prior to the initiation of the procedure.  After creating a small venotomy incision, a micropuncture  kit was utilized to access the left internal jugular vein under direct, real-time ultrasound guidance after the overlying soft tissues were anesthetized with 1% lidocaine with epinephrine. Ultrasound image documentation was performed.  Once the micropuncture set was introduced, the inner dilator wire removed and a wire was navigated into the right heart. Serial dilation of the soft tissue tract was performed over the wire, and a Kumpe the catheter was advanced over the wire navigating the wire into the IVC. Once the catheter was advanced the IVC the wire was exchanged for stiff wire. Before removing the first wire a length of 23 cm tip to cuff was estimated.  1% lidocaine was used to anesthetize the chest, and a small incision was made with 11 blade scalpel. The catheter was then back tunneled through the soft tissues to the IJ puncture  site. Catheter was then pulled through the tract. Peel-away sheath was placed over the stiff wire, and then the catheter was passed through the peel-away sheath. Peel-away sheath was removed.  Tip the catheter was positioned at the superior cavoatrial junction. Aspiration of the ports on the Palindrome catheter proved that the catheter was nonfunctional. Positioning of the catheter at multiple sites at the right atrium and superior cavoatrial junction prove to be nonfunctional.  Catheter was removed over a stiff wire which was subsequently advanced into the IVC. Balloon dilation of the right atrium was performed with and 8 mm balloon through a 10 French sheath in the tissue tract. Once the balloon was removed a new 23 cm tip to cuff Palindrome catheter was placed with the tip positioned at several positions at the superior cavoatrial junction and the right atrium. The catheter was nonfunctional.  At this time the tunneled right IJ catheter was exchanged over a wire, for the possibility of a catheter associated thrombus as the obstruction to flow of the left-sided HD catheter. This is dictated under different heading.  The left IJ catheter site was abandoned at this point with removal of the catheter and sterile dressing placed at the left IJ puncture site and left chest wall.  The right femoral vein was selected for a new catheter site.  The right femoral region was then prepped with chlorhexidine in a sterile fashion, and a sterile drape was applied covering the operative field. Maximum barrier sterile technique with sterile gowns and gloves were used for the procedure.  After creating a small venotomy incision, a micropuncture kit was utilized to access the right femoral vein under direct, real-time ultrasound guidance after the overlying soft tissues were anesthetized with 1% lidocaine with epinephrine. Ultrasound image documentation was performed.  Once the micropuncture set was introduced, the inner dilator  wire removed and a wire was navigated into the iliac vein. The inner dilator was removed with the wire and an 035 Bentson wire was advanced into the IVC. Soft tissue tract dilation was performed. A 44 cm tip to cuff hemodialysis catheter was estimated.  1% lidocaine was used to anesthetize the skin and subcutaneous tissues lateral to the femoral puncture site, and a small incision was made with 11 blade scalpel. The catheter was then back tunneled through the soft tissues. Catheter was then pulled through the tract. Peel-away sheath was placed over the wire, and then the catheter was passed through the peel-away sheath. Peel-away sheath was removed.  The 44 cm tip to cuff catheter was positioned just below the diaphragm and the red and blue port were aspirated. The catheter was nonfunctional.  Catheter was  removed over wire with a 10 French 25 cm sheath placed over the wire. A pigtail catheter was then advanced over the wire and an IVC cavagram was performed.  A 33 cm tip to cuff catheter was selected to place in the mid IVC. This was placed over a stiff wire once the sheath was removed through a peel-away.  This was proven to be functional in the mid IVC.  Patient tolerated the procedure well and remained hemodynamically stable throughout.  No complications were encountered and no significant blood loss was encountered.  Final catheter positioning was confirmed and documented with a spot radiographic image. The catheter aspirates and flushes normally. The catheter was flushed with appropriate volume heparin dwells.  The catheter exit site was secured with a 0-Prolene retention suture. Venotomy was closed with Derma bond. Dressings were applied. The patient tolerated the procedure well without immediate post procedural complication.  IMPRESSION: Status post placement of right femoral vein tunneled hemodialysis catheter. This catheter is a 33 cm tip to cuff position in the mid IVC and is proven to be functional.  Catheter is ready for use.  Before placement of the right femoral vein catheter, a left IJ approach 23 cm tip to cuff catheter was placed which was nonfunctional, as well as a right femoral catheter measuring 44 cm tip to cuff, which was nonfunctional. Presumably this was related to patient anatomy of the right heart and hepatic IVC.  Signed,  Dulcy Fanny. Earleen Newport, DO  [facility]  [facility]  PLAN: For future catheter placement, is useful to know that the catheter placed from left IJ approach proved to be non-functional at multiple positions within the right heart, although this remains a possible option. In addition, a catheter positioned at the hepatic IVC was also proved to be nonfunctional on today's study, potentially related to the size of the IVC.   Electronically Signed   By: Corrie Mckusick D.O.   On: 12/07/2014 13:00   Ir US Guide Vasc Access Right  12/07/2014   CLINICAL DATA:  39 year old female presents for initiation of hemodialysis, and placement of tunneled hemodialysis catheter.  A previous tunneled right central catheter is in place from right IJ approach and [place] left IJ was initially selected for hemodialysis catheter placement.  Nonfunctioning hemodialysis catheter from left IJ approach necessitated exchange of the right-sided IJ central catheter for trouble shooting purposes.  EXAM: ULTRASOUND AND FLUOROSCOPIC GUIDED RIGHT TUNNELED CATHETER EXCHANGE.  FLUOROSCOPY TIME:  11 MINUTES 30 SECONDS  TECHNIQUE: The procedure, risks, benefits, and alternatives were explained to the patient. Questions regarding the procedure were encouraged and answered. The patient understands and consents to the procedure.  The patient is position in the supine position on the fluoroscopy table. Right neck and right upper chest were prepped and draped in the usual sterile fashion with sterile gloves counts and usual sterile technique. 1% lidocaine was used for local anesthesia.   For trouble shooting purposes the right tunneled IJ central catheter was exchanged over a wire. The new catheter was trimmed at 21 cm length and confirmed in position within the right atrium after replacement.  Catheter was sutured in position with retention sutures.  After exchange of this catheter the case was continued with removal of left IJ hemodialysis catheter and placement of a right femoral line for hemodialysis initiation.  Patient tolerated the procedure well and remained hemodynamically stable throughout.  No complications were encountered and no significant blood loss was encountered.  COMPLICATIONS: None  IMPRESSION: Exchange of right IJ central catheter was performed during placement of a hemodialysis catheter for trouble shooting purposes when the initial left IJ approach HD catheter would not function.  Upon completion of exchange of the tunneled central catheter from right IJ approach, the catheter is functioning.  Signed,  Dulcy Fanny. Earleen Newport, DO  [facility]  [facility] Radiology   Electronically Signed   By: Corrie Mckusick D.O.   On: 12/07/2014 12:30   Medications: Infusions:    Scheduled Medications: . amLODipine  10 mg Oral BID  . atorvastatin  20 mg Oral q1800  . calcium acetate  1,334 mg Oral TID WC  . cephALEXin  250 mg Oral Q12H  . cyclobenzaprine  10 mg Oral QHS  . famotidine  40 mg Oral Daily  . heparin  5,000 Units Subcutaneous 3 times per day  . hydrALAZINE  10 mg Oral 3 times per day  . metoprolol succinate  50 mg Oral Daily  . mycophenolate  1,500 mg Oral BID  . predniSONE  20 mg Oral TID  . sodium chloride  3 mL Intravenous Q12H    have reviewed scheduled and prn medications.  Physical Exam: General: NAD Heart: RRR attempted line in upper chest with pressure dressings Lungs: clear Abdomen: soft, non tender Extremities: now with edema- right sided PC- pressure dressing     12/08/2014,9:56 AM  LOS: 5 days

## 2014-12-09 DIAGNOSIS — B961 Klebsiella pneumoniae [K. pneumoniae] as the cause of diseases classified elsewhere: Secondary | ICD-10-CM

## 2014-12-09 LAB — CBC WITH DIFFERENTIAL/PLATELET
Basophils Absolute: 0 10*3/uL (ref 0.0–0.1)
Basophils Relative: 0 % (ref 0–1)
EOS ABS: 0 10*3/uL (ref 0.0–0.7)
EOS PCT: 0 % (ref 0–5)
HCT: 25.3 % — ABNORMAL LOW (ref 36.0–46.0)
HEMOGLOBIN: 8.9 g/dL — AB (ref 12.0–15.0)
LYMPHS ABS: 0.4 10*3/uL — AB (ref 0.7–4.0)
Lymphocytes Relative: 5 % — ABNORMAL LOW (ref 12–46)
MCH: 29.2 pg (ref 26.0–34.0)
MCHC: 35.2 g/dL (ref 30.0–36.0)
MCV: 83 fL (ref 78.0–100.0)
MONO ABS: 0.1 10*3/uL (ref 0.1–1.0)
MONOS PCT: 1 % — AB (ref 3–12)
NEUTROS PCT: 94 % — AB (ref 43–77)
Neutro Abs: 8.3 10*3/uL — ABNORMAL HIGH (ref 1.7–7.7)
Platelets: 79 10*3/uL — ABNORMAL LOW (ref 150–400)
RBC: 3.05 MIL/uL — ABNORMAL LOW (ref 3.87–5.11)
RDW: 13.3 % (ref 11.5–15.5)
WBC: 8.8 10*3/uL (ref 4.0–10.5)

## 2014-12-09 LAB — TYPE AND SCREEN
ABO/RH(D): A POS
Antibody Screen: NEGATIVE
UNIT DIVISION: 0
Unit division: 0

## 2014-12-09 MED ORDER — HYDRALAZINE HCL 50 MG PO TABS
50.0000 mg | ORAL_TABLET | Freq: Three times a day (TID) | ORAL | Status: DC
Start: 2014-12-09 — End: 2014-12-21
  Administered 2014-12-09 – 2014-12-21 (×33): 50 mg via ORAL
  Filled 2014-12-09 (×15): qty 1
  Filled 2014-12-09: qty 2
  Filled 2014-12-09 (×17): qty 1

## 2014-12-09 NOTE — Progress Notes (Signed)
Patient still has bleeding episodes from her lt chest opening, Rt fem HD cath and central line. Dressing changed a couple of times and MD made aware. No new intervention at this time. Awaiting on CBC to drawn and will go from there.

## 2014-12-09 NOTE — Progress Notes (Signed)
Subjective: Continues to have bleeding from her fem cath site and left chest wall where tunneled IJ cath insertion was attempted. Received 2 unit pRBC yesterday. hgb 8.9 today. Platelet down to 79 today. Has some nausea, no other complaints.   Objective: Vital signs in last 24 hours: Filed Vitals:   12/08/14 2130 12/08/14 2200 12/08/14 2308 12/09/14 0503  BP: 160/90 160/90 160/88 159/75  Pulse: 104 100 98 88  Temp: 98 F (36.7 C)  98 F (36.7 C) 97.7 F (36.5 C)  TempSrc: Oral  Oral Oral  Resp:  15 18 18   Height:      Weight:   244 lb 11.4 oz (111 kg) 240 lb 4.8 oz (109 kg)  SpO2: 100% 100% 100% 100%   Weight change: -6 lb 9.8 oz (-3 kg)  Intake/Output Summary (Last 24 hours) at 12/09/14 0819 Last data filed at 12/09/14 0400  Gross per 24 hour  Intake   1330 ml  Output   2340 ml  Net  -1010 ml   GENERAL- alert, orientedx3, NAD HEENT- Atraumatic, oral mucosa appears moist CARDIAC- RRR, no murmurs, rubs or gallops. RESP- CTAB, no wheezes or crackles. ABDOMEN- No left sided abdominal TTP on exam, no guarding or rebound, normoactive bowel sounds EXTREMITIES- 1+ pedal edema b/l SKIN- Warm, dry PSYCH- Anxious, calmed down with reassurance  Lab Results: Basic Metabolic Panel:  Recent Labs Lab 12/08/14 0525 12/08/14 1830  NA 138 135  K 4.5 4.6  CL 106 102  CO2 18* 16*  GLUCOSE 126* 137*  BUN 129* 130*  CREATININE 6.38* 6.63*  CALCIUM 8.1* 8.3*  PHOS 10.9* 11.9*   Liver Function Tests:  Recent Labs Lab 12/04/14 0538  12/07/14 1707 12/08/14 0525 12/08/14 1830  AST 19  --   --   --   --   ALT 18  --  26  --   --   ALKPHOS 48  --   --   --   --   BILITOT 0.3  --   --   --   --   PROT 6.6  --   --   --   --   ALBUMIN 3.0*  < > 3.1* 2.7* 2.9*  < > = values in this interval not displayed. No results for input(s): LIPASE, AMYLASE in the last 168 hours. No results for input(s): AMMONIA in the last 168 hours. CBC:  Recent Labs Lab 12/08/14 0525  12/08/14 1830 12/09/14 0515  WBC 11.1* 12.3* 8.8  NEUTROABS 10.6*  --  8.3*  HGB 7.5* 7.1* 8.9*  HCT 21.5* 20.6* 25.3*  MCV 82.1 82.4 83.0  PLT 128* 134* 79*   Cardiac Enzymes: No results for input(s): CKTOTAL, CKMB, CKMBINDEX, TROPONINI in the last 168 hours. BNP: No results for input(s): PROBNP in the last 168 hours. D-Dimer: No results for input(s): DDIMER in the last 168 hours. CBG: No results for input(s): GLUCAP in the last 168 hours. Hemoglobin A1C: No results for input(s): HGBA1C in the last 168 hours. Fasting Lipid Panel: No results for input(s): CHOL, HDL, LDLCALC, TRIG, CHOLHDL, LDLDIRECT in the last 168 hours. Thyroid Function Tests: No results for input(s): TSH, T4TOTAL, FREET4, T3FREE, THYROIDAB in the last 168 hours. Coagulation:  Recent Labs Lab 12/06/14 1440  LABPROT 13.9  INR 1.05   Anemia Panel: No results for input(s): VITAMINB12, FOLATE, FERRITIN, TIBC, IRON, RETICCTPCT in the last 168 hours. Urine Drug Screen: Drugs of Abuse     Component Value Date/Time   LABOPIA  NONE DETECTED 07/21/2012 0215   COCAINSCRNUR NONE DETECTED 07/21/2012 0215   LABBENZ NONE DETECTED 07/21/2012 0215   AMPHETMU NONE DETECTED 07/21/2012 0215   THCU NONE DETECTED 07/21/2012 0215   LABBARB NONE DETECTED 07/21/2012 0215    Alcohol Level: No results for input(s): ETH in the last 168 hours. Urinalysis:  Recent Labs Lab 12/03/14 1152  COLORURINE YELLOW  LABSPEC 1.019  PHURINE 5.0  GLUCOSEU NEGATIVE  HGBUR LARGE*  BILIRUBINUR NEGATIVE  KETONESUR NEGATIVE  PROTEINUR >300*  UROBILINOGEN 0.2  NITRITE NEGATIVE  LEUKOCYTESUR MODERATE*    Micro Results: Recent Results (from the past 240 hour(s))  Urine culture     Status: None   Collection Time: 12/03/14  1:44 PM  Result Value Ref Range Status   Specimen Description URINE, CLEAN CATCH  Final   Special Requests Normal  Final   Culture >=100,000 COLONIES/mL KLEBSIELLA PNEUMONIAE  Final   Report Status 12/06/2014  FINAL  Final   Organism ID, Bacteria KLEBSIELLA PNEUMONIAE  Final      Susceptibility   Klebsiella pneumoniae - MIC*    AMPICILLIN 4 RESISTANT Resistant     CEFAZOLIN <=4 SENSITIVE Sensitive     CEFTRIAXONE <=1 SENSITIVE Sensitive     CIPROFLOXACIN <=0.25 SENSITIVE Sensitive     GENTAMICIN <=1 SENSITIVE Sensitive     IMIPENEM <=0.25 SENSITIVE Sensitive     NITROFURANTOIN 64 INTERMEDIATE Intermediate     TRIMETH/SULFA <=20 SENSITIVE Sensitive     AMPICILLIN/SULBACTAM 4 SENSITIVE Sensitive     PIP/TAZO 8 SENSITIVE Sensitive     * >=100,000 COLONIES/mL KLEBSIELLA PNEUMONIAE   Studies/Results: Ir Pta Venous Left  12/07/2014   INDICATION: 39 year old female with end-stage renal disease has been referred for tunneled hemodialysis catheter placement for initiation of hemodialysis.  She has had a prior right-sided tunneled IJ catheter placed for medications. The left IJ approach was selected.  EXAM: TUNNELED CENTRAL VENOUS HEMODIALYSIS CATHETER PLACEMENT WITH ULTRASOUND AND FLUOROSCOPIC GUIDANCE  ULTRASOUND GUIDED ACCESS OF LEFT INTERNAL JUGULAR VEIN AND RIGHT COMMON FEMORAL VEIN  MEDICATIONS: Ancef 2 gm IV; The IV antibiotic was given in an appropriate time interval prior to skin puncture.  CONTRAST:  None  ANESTHESIA/SEDATION: Versed 4.0 mg IV; Fentanyl 200 mcg IV  Total Moderate Sedation Time  One hundred forty-one minutes.  FLUOROSCOPY TIME:  11 minutes 30 seconds  COMPLICATIONS: None  PROCEDURE: Informed written consent was obtained from the patient after a discussion of the risks, benefits, and alternatives to treatment. Questions regarding the procedure were encouraged and answered. The left neck and chest were prepped with chlorhexidine in a sterile fashion, and a sterile drape was applied covering the operative field. Maximum barrier sterile technique with sterile gowns and gloves were used for the procedure. A timeout was performed prior to the initiation of the procedure.  After creating a small  venotomy incision, a micropuncture kit was utilized to access the left internal jugular vein under direct, real-time ultrasound guidance after the overlying soft tissues were anesthetized with 1% lidocaine with epinephrine. Ultrasound image documentation was performed.  Once the micropuncture set was introduced, the inner dilator wire removed and a wire was navigated into the right heart. Serial dilation of the soft tissue tract was performed over the wire, and a Kumpe the catheter was advanced over the wire navigating the wire into the IVC. Once the catheter was advanced the IVC the wire was exchanged for stiff wire. Before removing the first wire a length of 23 cm tip to cuff was estimated.  1% lidocaine was used to anesthetize the chest, and a small incision was made with 11 blade scalpel. The catheter was then back tunneled through the soft tissues to the IJ puncture site. Catheter was then pulled through the tract. Peel-away sheath was placed over the stiff wire, and then the catheter was passed through the peel-away sheath. Peel-away sheath was removed.  Tip the catheter was positioned at the superior cavoatrial junction. Aspiration of the ports on the Palindrome catheter proved that the catheter was nonfunctional. Positioning of the catheter at multiple sites at the right atrium and superior cavoatrial junction prove to be nonfunctional.  Catheter was removed over a stiff wire which was subsequently advanced into the IVC. Balloon dilation of the right atrium was performed with and 8 mm balloon through a 10 French sheath in the tissue tract. Once the balloon was removed a new 23 cm tip to cuff Palindrome catheter was placed with the tip positioned at several positions at the superior cavoatrial junction and the right atrium. The catheter was nonfunctional.  At this time the tunneled right IJ catheter was exchanged over a wire, for the possibility of a catheter associated thrombus as the obstruction to flow of  the left-sided HD catheter. This is dictated under different heading.  The left IJ catheter site was abandoned at this point with removal of the catheter and sterile dressing placed at the left IJ puncture site and left chest wall.  The right femoral vein was selected for a new catheter site.  The right femoral region was then prepped with chlorhexidine in a sterile fashion, and a sterile drape was applied covering the operative field. Maximum barrier sterile technique with sterile gowns and gloves were used for the procedure.  After creating a small venotomy incision, a micropuncture kit was utilized to access the right femoral vein under direct, real-time ultrasound guidance after the overlying soft tissues were anesthetized with 1% lidocaine with epinephrine. Ultrasound image documentation was performed.  Once the micropuncture set was introduced, the inner dilator wire removed and a wire was navigated into the iliac vein. The inner dilator was removed with the wire and an 035 Bentson wire was advanced into the IVC. Soft tissue tract dilation was performed. A 44 cm tip to cuff hemodialysis catheter was estimated.  1% lidocaine was used to anesthetize the skin and subcutaneous tissues lateral to the femoral puncture site, and a small incision was made with 11 blade scalpel. The catheter was then back tunneled through the soft tissues. Catheter was then pulled through the tract. Peel-away sheath was placed over the wire, and then the catheter was passed through the peel-away sheath. Peel-away sheath was removed.  The 44 cm tip to cuff catheter was positioned just below the diaphragm and the red and blue port were aspirated. The catheter was nonfunctional.  Catheter was removed over wire with a 10 French 25 cm sheath placed over the wire. A pigtail catheter was then advanced over the wire and an IVC cavagram was performed.  A 33 cm tip to cuff catheter was selected to place in the mid IVC. This was placed over a  stiff wire once the sheath was removed through a peel-away.  This was proven to be functional in the mid IVC.  Patient tolerated the procedure well and remained hemodynamically stable throughout.  No complications were encountered and no significant blood loss was encountered.  Final catheter positioning was confirmed and documented with a spot radiographic image. The catheter aspirates and  flushes normally. The catheter was flushed with appropriate volume heparin dwells.  The catheter exit site was secured with a 0-Prolene retention suture. Venotomy was closed with Derma bond. Dressings were applied. The patient tolerated the procedure well without immediate post procedural complication.  IMPRESSION: Status post placement of right femoral vein tunneled hemodialysis catheter. This catheter is a 33 cm tip to cuff position in the mid IVC and is proven to be functional. Catheter is ready for use.  Before placement of the right femoral vein catheter, a left IJ approach 23 cm tip to cuff catheter was placed which was nonfunctional, as well as a right femoral catheter measuring 44 cm tip to cuff, which was nonfunctional. Presumably this was related to patient anatomy of the right heart and hepatic IVC.  Signed,  Dulcy Fanny. Earleen Newport, DO  [facility]  [facility]  PLAN: For future catheter placement, is useful to know that the catheter placed from left IJ approach proved to be non-functional at multiple positions within the right heart, although this remains a possible option. In addition, a catheter positioned at the hepatic IVC was also proved to be nonfunctional on today's study, potentially related to the size of the IVC.   Electronically Signed   By: Corrie Mckusick D.O.   On: 12/07/2014 13:00   Ir Fluoro Guide Cv Line Left  12/07/2014   INDICATION: 39 year old female with end-stage renal disease has been referred for tunneled hemodialysis catheter placement for initiation of  hemodialysis.  She has had a prior right-sided tunneled IJ catheter placed for medications. The left IJ approach was selected.  EXAM: TUNNELED CENTRAL VENOUS HEMODIALYSIS CATHETER PLACEMENT WITH ULTRASOUND AND FLUOROSCOPIC GUIDANCE  ULTRASOUND GUIDED ACCESS OF LEFT INTERNAL JUGULAR VEIN AND RIGHT COMMON FEMORAL VEIN  MEDICATIONS: Ancef 2 gm IV; The IV antibiotic was given in an appropriate time interval prior to skin puncture.  CONTRAST:  None  ANESTHESIA/SEDATION: Versed 4.0 mg IV; Fentanyl 200 mcg IV  Total Moderate Sedation Time  One hundred forty-one minutes.  FLUOROSCOPY TIME:  11 minutes 30 seconds  COMPLICATIONS: None  PROCEDURE: Informed written consent was obtained from the patient after a discussion of the risks, benefits, and alternatives to treatment. Questions regarding the procedure were encouraged and answered. The left neck and chest were prepped with chlorhexidine in a sterile fashion, and a sterile drape was applied covering the operative field. Maximum barrier sterile technique with sterile gowns and gloves were used for the procedure. A timeout was performed prior to the initiation of the procedure.  After creating a small venotomy incision, a micropuncture kit was utilized to access the left internal jugular vein under direct, real-time ultrasound guidance after the overlying soft tissues were anesthetized with 1% lidocaine with epinephrine. Ultrasound image documentation was performed.  Once the micropuncture set was introduced, the inner dilator wire removed and a wire was navigated into the right heart. Serial dilation of the soft tissue tract was performed over the wire, and a Kumpe the catheter was advanced over the wire navigating the wire into the IVC. Once the catheter was advanced the IVC the wire was exchanged for stiff wire. Before removing the first wire a length of 23 cm tip to cuff was estimated.  1% lidocaine was used to anesthetize the chest, and a small incision was made with 11  blade scalpel. The catheter was then back tunneled through the soft tissues to the IJ puncture site. Catheter was then pulled through the tract. Peel-away sheath  was placed over the stiff wire, and then the catheter was passed through the peel-away sheath. Peel-away sheath was removed.  Tip the catheter was positioned at the superior cavoatrial junction. Aspiration of the ports on the Palindrome catheter proved that the catheter was nonfunctional. Positioning of the catheter at multiple sites at the right atrium and superior cavoatrial junction prove to be nonfunctional.  Catheter was removed over a stiff wire which was subsequently advanced into the IVC. Balloon dilation of the right atrium was performed with and 8 mm balloon through a 10 French sheath in the tissue tract. Once the balloon was removed a new 23 cm tip to cuff Palindrome catheter was placed with the tip positioned at several positions at the superior cavoatrial junction and the right atrium. The catheter was nonfunctional.  At this time the tunneled right IJ catheter was exchanged over a wire, for the possibility of a catheter associated thrombus as the obstruction to flow of the left-sided HD catheter. This is dictated under different heading.  The left IJ catheter site was abandoned at this point with removal of the catheter and sterile dressing placed at the left IJ puncture site and left chest wall.  The right femoral vein was selected for a new catheter site.  The right femoral region was then prepped with chlorhexidine in a sterile fashion, and a sterile drape was applied covering the operative field. Maximum barrier sterile technique with sterile gowns and gloves were used for the procedure.  After creating a small venotomy incision, a micropuncture kit was utilized to access the right femoral vein under direct, real-time ultrasound guidance after the overlying soft tissues were anesthetized with 1% lidocaine with epinephrine. Ultrasound image  documentation was performed.  Once the micropuncture set was introduced, the inner dilator wire removed and a wire was navigated into the iliac vein. The inner dilator was removed with the wire and an 035 Bentson wire was advanced into the IVC. Soft tissue tract dilation was performed. A 44 cm tip to cuff hemodialysis catheter was estimated.  1% lidocaine was used to anesthetize the skin and subcutaneous tissues lateral to the femoral puncture site, and a small incision was made with 11 blade scalpel. The catheter was then back tunneled through the soft tissues. Catheter was then pulled through the tract. Peel-away sheath was placed over the wire, and then the catheter was passed through the peel-away sheath. Peel-away sheath was removed.  The 44 cm tip to cuff catheter was positioned just below the diaphragm and the red and blue port were aspirated. The catheter was nonfunctional.  Catheter was removed over wire with a 10 French 25 cm sheath placed over the wire. A pigtail catheter was then advanced over the wire and an IVC cavagram was performed.  A 33 cm tip to cuff catheter was selected to place in the mid IVC. This was placed over a stiff wire once the sheath was removed through a peel-away.  This was proven to be functional in the mid IVC.  Patient tolerated the procedure well and remained hemodynamically stable throughout.  No complications were encountered and no significant blood loss was encountered.  Final catheter positioning was confirmed and documented with a spot radiographic image. The catheter aspirates and flushes normally. The catheter was flushed with appropriate volume heparin dwells.  The catheter exit site was secured with a 0-Prolene retention suture. Venotomy was closed with Derma bond. Dressings were applied. The patient tolerated the procedure well without immediate post procedural complication.  IMPRESSION: Status post placement of right femoral vein tunneled hemodialysis catheter. This  catheter is a 33 cm tip to cuff position in the mid IVC and is proven to be functional. Catheter is ready for use.  Before placement of the right femoral vein catheter, a left IJ approach 23 cm tip to cuff catheter was placed which was nonfunctional, as well as a right femoral catheter measuring 44 cm tip to cuff, which was nonfunctional. Presumably this was related to patient anatomy of the right heart and hepatic IVC.  Signed,  Dulcy Fanny. Earleen Newport, DO  [facility]  [facility]  PLAN: For future catheter placement, is useful to know that the catheter placed from left IJ approach proved to be non-functional at multiple positions within the right heart, although this remains a possible option. In addition, a catheter positioned at the hepatic IVC was also proved to be nonfunctional on today's study, potentially related to the size of the IVC.   Electronically Signed   By: Corrie Mckusick D.O.   On: 12/07/2014 13:00   Ir Fluoro Guide Cv Line Right  12/07/2014   INDICATION: 39 year old female with end-stage renal disease has been referred for tunneled hemodialysis catheter placement for initiation of hemodialysis.  She has had a prior right-sided tunneled IJ catheter placed for medications. The left IJ approach was selected.  EXAM: TUNNELED CENTRAL VENOUS HEMODIALYSIS CATHETER PLACEMENT WITH ULTRASOUND AND FLUOROSCOPIC GUIDANCE  ULTRASOUND GUIDED ACCESS OF LEFT INTERNAL JUGULAR VEIN AND RIGHT COMMON FEMORAL VEIN  MEDICATIONS: Ancef 2 gm IV; The IV antibiotic was given in an appropriate time interval prior to skin puncture.  CONTRAST:  None  ANESTHESIA/SEDATION: Versed 4.0 mg IV; Fentanyl 200 mcg IV  Total Moderate Sedation Time  One hundred forty-one minutes.  FLUOROSCOPY TIME:  11 minutes 30 seconds  COMPLICATIONS: None  PROCEDURE: Informed written consent was obtained from the patient after a discussion of the risks, benefits, and alternatives to treatment. Questions  regarding the procedure were encouraged and answered. The left neck and chest were prepped with chlorhexidine in a sterile fashion, and a sterile drape was applied covering the operative field. Maximum barrier sterile technique with sterile gowns and gloves were used for the procedure. A timeout was performed prior to the initiation of the procedure.  After creating a small venotomy incision, a micropuncture kit was utilized to access the left internal jugular vein under direct, real-time ultrasound guidance after the overlying soft tissues were anesthetized with 1% lidocaine with epinephrine. Ultrasound image documentation was performed.  Once the micropuncture set was introduced, the inner dilator wire removed and a wire was navigated into the right heart. Serial dilation of the soft tissue tract was performed over the wire, and a Kumpe the catheter was advanced over the wire navigating the wire into the IVC. Once the catheter was advanced the IVC the wire was exchanged for stiff wire. Before removing the first wire a length of 23 cm tip to cuff was estimated.  1% lidocaine was used to anesthetize the chest, and a small incision was made with 11 blade scalpel. The catheter was then back tunneled through the soft tissues to the IJ puncture site. Catheter was then pulled through the tract. Peel-away sheath was placed over the stiff wire, and then the catheter was passed through the peel-away sheath. Peel-away sheath was removed.  Tip the catheter was positioned at the superior cavoatrial junction. Aspiration of the ports on the Palindrome catheter proved that the catheter was  nonfunctional. Positioning of the catheter at multiple sites at the right atrium and superior cavoatrial junction prove to be nonfunctional.  Catheter was removed over a stiff wire which was subsequently advanced into the IVC. Balloon dilation of the right atrium was performed with and 8 mm balloon through a 10 French sheath in the tissue  tract. Once the balloon was removed a new 23 cm tip to cuff Palindrome catheter was placed with the tip positioned at several positions at the superior cavoatrial junction and the right atrium. The catheter was nonfunctional.  At this time the tunneled right IJ catheter was exchanged over a wire, for the possibility of a catheter associated thrombus as the obstruction to flow of the left-sided HD catheter. This is dictated under different heading.  The left IJ catheter site was abandoned at this point with removal of the catheter and sterile dressing placed at the left IJ puncture site and left chest wall.  The right femoral vein was selected for a new catheter site.  The right femoral region was then prepped with chlorhexidine in a sterile fashion, and a sterile drape was applied covering the operative field. Maximum barrier sterile technique with sterile gowns and gloves were used for the procedure.  After creating a small venotomy incision, a micropuncture kit was utilized to access the right femoral vein under direct, real-time ultrasound guidance after the overlying soft tissues were anesthetized with 1% lidocaine with epinephrine. Ultrasound image documentation was performed.  Once the micropuncture set was introduced, the inner dilator wire removed and a wire was navigated into the iliac vein. The inner dilator was removed with the wire and an 035 Bentson wire was advanced into the IVC. Soft tissue tract dilation was performed. A 44 cm tip to cuff hemodialysis catheter was estimated.  1% lidocaine was used to anesthetize the skin and subcutaneous tissues lateral to the femoral puncture site, and a small incision was made with 11 blade scalpel. The catheter was then back tunneled through the soft tissues. Catheter was then pulled through the tract. Peel-away sheath was placed over the wire, and then the catheter was passed through the peel-away sheath. Peel-away sheath was removed.  The 44 cm tip to cuff  catheter was positioned just below the diaphragm and the red and blue port were aspirated. The catheter was nonfunctional.  Catheter was removed over wire with a 10 French 25 cm sheath placed over the wire. A pigtail catheter was then advanced over the wire and an IVC cavagram was performed.  A 33 cm tip to cuff catheter was selected to place in the mid IVC. This was placed over a stiff wire once the sheath was removed through a peel-away.  This was proven to be functional in the mid IVC.  Patient tolerated the procedure well and remained hemodynamically stable throughout.  No complications were encountered and no significant blood loss was encountered.  Final catheter positioning was confirmed and documented with a spot radiographic image. The catheter aspirates and flushes normally. The catheter was flushed with appropriate volume heparin dwells.  The catheter exit site was secured with a 0-Prolene retention suture. Venotomy was closed with Derma bond. Dressings were applied. The patient tolerated the procedure well without immediate post procedural complication.  IMPRESSION: Status post placement of right femoral vein tunneled hemodialysis catheter. This catheter is a 33 cm tip to cuff position in the mid IVC and is proven to be functional. Catheter is ready for use.  Before placement of the right  femoral vein catheter, a left IJ approach 23 cm tip to cuff catheter was placed which was nonfunctional, as well as a right femoral catheter measuring 44 cm tip to cuff, which was nonfunctional. Presumably this was related to patient anatomy of the right heart and hepatic IVC.  Signed,  Dulcy Fanny. Earleen Newport, DO  [facility]  [facility] Radiology  PLAN: For future catheter placement, is useful to know that the catheter placed from left IJ approach proved to be non-functional at multiple positions within the right heart, although this remains a possible option. In addition, a catheter  positioned at the hepatic IVC was also proved to be nonfunctional on today's study, potentially related to the size of the IVC.   Electronically Signed   By: Corrie Mckusick D.O.   On: 12/07/2014 13:00   Ir Fluoro Guide Cv Line Right  12/07/2014   CLINICAL DATA:  39 year old female presents for initiation of hemodialysis, and placement of tunneled hemodialysis catheter.  A previous tunneled right central catheter is in place from right IJ approach and [place] left IJ was initially selected for hemodialysis catheter placement.  Nonfunctioning hemodialysis catheter from left IJ approach necessitated exchange of the right-sided IJ central catheter for trouble shooting purposes.  EXAM: ULTRASOUND AND FLUOROSCOPIC GUIDED RIGHT TUNNELED CATHETER EXCHANGE.  FLUOROSCOPY TIME:  11 MINUTES 30 SECONDS  TECHNIQUE: The procedure, risks, benefits, and alternatives were explained to the patient. Questions regarding the procedure were encouraged and answered. The patient understands and consents to the procedure.  The patient is position in the supine position on the fluoroscopy table. Right neck and right upper chest were prepped and draped in the usual sterile fashion with sterile gloves counts and usual sterile technique. 1% lidocaine was used for local anesthesia.  For trouble shooting purposes the right tunneled IJ central catheter was exchanged over a wire. The new catheter was trimmed at 21 cm length and confirmed in position within the right atrium after replacement.  Catheter was sutured in position with retention sutures.  After exchange of this catheter the case was continued with removal of left IJ hemodialysis catheter and placement of a right femoral line for hemodialysis initiation.  Patient tolerated the procedure well and remained hemodynamically stable throughout.  No complications were encountered and no significant blood loss was encountered.  COMPLICATIONS: None  IMPRESSION: Exchange of right IJ central catheter was  performed during placement of a hemodialysis catheter for trouble shooting purposes when the initial left IJ approach HD catheter would not function.  Upon completion of exchange of the tunneled central catheter from right IJ approach, the catheter is functioning.  Signed,  Dulcy Fanny. Earleen Newport, DO  [facility]  [facility] Radiology   Electronically Signed   By: Corrie Mckusick D.O.   On: 12/07/2014 12:30   Ir Removal Tun Cv Cath W/o Fl  12/08/2014   INDICATION: 39 year old female with end-stage renal disease has been referred for tunneled hemodialysis catheter placement for initiation of hemodialysis.  She has had a prior right-sided tunneled IJ catheter placed for medications. The left IJ approach was selected.  EXAM: TUNNELED CENTRAL VENOUS HEMODIALYSIS CATHETER PLACEMENT WITH ULTRASOUND AND FLUOROSCOPIC GUIDANCE  ULTRASOUND GUIDED ACCESS OF LEFT INTERNAL JUGULAR VEIN AND RIGHT COMMON FEMORAL VEIN  MEDICATIONS: Ancef 2 gm IV; The IV antibiotic was given in an appropriate time interval prior to skin puncture.  CONTRAST:  None  ANESTHESIA/SEDATION: Versed 4.0 mg IV; Fentanyl 200 mcg IV  Total Moderate Sedation  Time  One hundred forty-one minutes.  FLUOROSCOPY TIME:  11 minutes 30 seconds  COMPLICATIONS: None  PROCEDURE: Informed written consent was obtained from the patient after a discussion of the risks, benefits, and alternatives to treatment. Questions regarding the procedure were encouraged and answered. The left neck and chest were prepped with chlorhexidine in a sterile fashion, and a sterile drape was applied covering the operative field. Maximum barrier sterile technique with sterile gowns and gloves were used for the procedure. A timeout was performed prior to the initiation of the procedure.  After creating a small venotomy incision, a micropuncture kit was utilized to access the left internal jugular vein under direct, real-time ultrasound guidance after the overlying  soft tissues were anesthetized with 1% lidocaine with epinephrine. Ultrasound image documentation was performed.  Once the micropuncture set was introduced, the inner dilator wire removed and a wire was navigated into the right heart. Serial dilation of the soft tissue tract was performed over the wire, and a Kumpe the catheter was advanced over the wire navigating the wire into the IVC. Once the catheter was advanced the IVC the wire was exchanged for stiff wire. Before removing the first wire a length of 23 cm tip to cuff was estimated.  1% lidocaine was used to anesthetize the chest, and a small incision was made with 11 blade scalpel. The catheter was then back tunneled through the soft tissues to the IJ puncture site. Catheter was then pulled through the tract. Peel-away sheath was placed over the stiff wire, and then the catheter was passed through the peel-away sheath. Peel-away sheath was removed.  Tip the catheter was positioned at the superior cavoatrial junction. Aspiration of the ports on the Palindrome catheter proved that the catheter was nonfunctional. Positioning of the catheter at multiple sites at the right atrium and superior cavoatrial junction prove to be nonfunctional.  Catheter was removed over a stiff wire which was subsequently advanced into the IVC. Balloon dilation of the right atrium was performed with and 8 mm balloon through a 10 French sheath in the tissue tract. Once the balloon was removed a new 23 cm tip to cuff Palindrome catheter was placed with the tip positioned at several positions at the superior cavoatrial junction and the right atrium. The catheter was nonfunctional.  At this time the tunneled right IJ catheter was exchanged over a wire, for the possibility of a catheter associated thrombus as the obstruction to flow of the left-sided HD catheter. This is dictated under different heading.  The left IJ catheter site was abandoned at this point with removal of the catheter and  sterile dressing placed at the left IJ puncture site and left chest wall.  The right femoral vein was selected for a new catheter site.  The right femoral region was then prepped with chlorhexidine in a sterile fashion, and a sterile drape was applied covering the operative field. Maximum barrier sterile technique with sterile gowns and gloves were used for the procedure.  After creating a small venotomy incision, a micropuncture kit was utilized to access the right femoral vein under direct, real-time ultrasound guidance after the overlying soft tissues were anesthetized with 1% lidocaine with epinephrine. Ultrasound image documentation was performed.  Once the micropuncture set was introduced, the inner dilator wire removed and a wire was navigated into the iliac vein. The inner dilator was removed with the wire and an 035 Bentson wire was advanced into the IVC. Soft tissue tract dilation was performed. A 44  cm tip to cuff hemodialysis catheter was estimated.  1% lidocaine was used to anesthetize the skin and subcutaneous tissues lateral to the femoral puncture site, and a small incision was made with 11 blade scalpel. The catheter was then back tunneled through the soft tissues. Catheter was then pulled through the tract. Peel-away sheath was placed over the wire, and then the catheter was passed through the peel-away sheath. Peel-away sheath was removed.  The 44 cm tip to cuff catheter was positioned just below the diaphragm and the red and blue port were aspirated. The catheter was nonfunctional.  Catheter was removed over wire with a 10 French 25 cm sheath placed over the wire. A pigtail catheter was then advanced over the wire and an IVC cavagram was performed.  A 33 cm tip to cuff catheter was selected to place in the mid IVC. This was placed over a stiff wire once the sheath was removed through a peel-away.  This was proven to be functional in the mid IVC.  Patient tolerated the procedure well and remained  hemodynamically stable throughout.  No complications were encountered and no significant blood loss was encountered.  Final catheter positioning was confirmed and documented with a spot radiographic image. The catheter aspirates and flushes normally. The catheter was flushed with appropriate volume heparin dwells.  The catheter exit site was secured with a 0-Prolene retention suture. Venotomy was closed with Derma bond. Dressings were applied. The patient tolerated the procedure well without immediate post procedural complication.  IMPRESSION: Status post placement of right femoral vein tunneled hemodialysis catheter. This catheter is a 33 cm tip to cuff position in the mid IVC and is proven to be functional. Catheter is ready for use.  Before placement of the right femoral vein catheter, a left IJ approach 23 cm tip to cuff catheter was placed which was nonfunctional, as well as a right femoral catheter measuring 44 cm tip to cuff, which was nonfunctional. Presumably this was related to patient anatomy of the right heart and hepatic IVC.  Signed,  Dulcy Fanny. Earleen Newport, DO  [facility]  [facility] Radiology  PLAN: For future catheter placement, is useful to know that the catheter placed from left IJ approach proved to be non-functional at multiple positions within the right heart, although this remains a possible option. In addition, a catheter positioned at the hepatic IVC was also proved to be nonfunctional on today's study, potentially related to the size of the IVC.   Electronically Signed   By: Corrie Mckusick D.O.   On: 12/07/2014 13:00   Ir US Guide Vasc Access Left  12/07/2014   INDICATION: 39 year old female with end-stage renal disease has been referred for tunneled hemodialysis catheter placement for initiation of hemodialysis.  She has had a prior right-sided tunneled IJ catheter placed for medications. The left IJ approach was selected.  EXAM: TUNNELED CENTRAL VENOUS  HEMODIALYSIS CATHETER PLACEMENT WITH ULTRASOUND AND FLUOROSCOPIC GUIDANCE  ULTRASOUND GUIDED ACCESS OF LEFT INTERNAL JUGULAR VEIN AND RIGHT COMMON FEMORAL VEIN  MEDICATIONS: Ancef 2 gm IV; The IV antibiotic was given in an appropriate time interval prior to skin puncture.  CONTRAST:  None  ANESTHESIA/SEDATION: Versed 4.0 mg IV; Fentanyl 200 mcg IV  Total Moderate Sedation Time  One hundred forty-one minutes.  FLUOROSCOPY TIME:  11 minutes 30 seconds  COMPLICATIONS: None  PROCEDURE: Informed written consent was obtained from the patient after a discussion of the risks, benefits, and alternatives to treatment. Questions regarding the procedure  were encouraged and answered. The left neck and chest were prepped with chlorhexidine in a sterile fashion, and a sterile drape was applied covering the operative field. Maximum barrier sterile technique with sterile gowns and gloves were used for the procedure. A timeout was performed prior to the initiation of the procedure.  After creating a small venotomy incision, a micropuncture kit was utilized to access the left internal jugular vein under direct, real-time ultrasound guidance after the overlying soft tissues were anesthetized with 1% lidocaine with epinephrine. Ultrasound image documentation was performed.  Once the micropuncture set was introduced, the inner dilator wire removed and a wire was navigated into the right heart. Serial dilation of the soft tissue tract was performed over the wire, and a Kumpe the catheter was advanced over the wire navigating the wire into the IVC. Once the catheter was advanced the IVC the wire was exchanged for stiff wire. Before removing the first wire a length of 23 cm tip to cuff was estimated.  1% lidocaine was used to anesthetize the chest, and a small incision was made with 11 blade scalpel. The catheter was then back tunneled through the soft tissues to the IJ puncture site. Catheter was then pulled through the tract. Peel-away  sheath was placed over the stiff wire, and then the catheter was passed through the peel-away sheath. Peel-away sheath was removed.  Tip the catheter was positioned at the superior cavoatrial junction. Aspiration of the ports on the Palindrome catheter proved that the catheter was nonfunctional. Positioning of the catheter at multiple sites at the right atrium and superior cavoatrial junction prove to be nonfunctional.  Catheter was removed over a stiff wire which was subsequently advanced into the IVC. Balloon dilation of the right atrium was performed with and 8 mm balloon through a 10 French sheath in the tissue tract. Once the balloon was removed a new 23 cm tip to cuff Palindrome catheter was placed with the tip positioned at several positions at the superior cavoatrial junction and the right atrium. The catheter was nonfunctional.  At this time the tunneled right IJ catheter was exchanged over a wire, for the possibility of a catheter associated thrombus as the obstruction to flow of the left-sided HD catheter. This is dictated under different heading.  The left IJ catheter site was abandoned at this point with removal of the catheter and sterile dressing placed at the left IJ puncture site and left chest wall.  The right femoral vein was selected for a new catheter site.  The right femoral region was then prepped with chlorhexidine in a sterile fashion, and a sterile drape was applied covering the operative field. Maximum barrier sterile technique with sterile gowns and gloves were used for the procedure.  After creating a small venotomy incision, a micropuncture kit was utilized to access the right femoral vein under direct, real-time ultrasound guidance after the overlying soft tissues were anesthetized with 1% lidocaine with epinephrine. Ultrasound image documentation was performed.  Once the micropuncture set was introduced, the inner dilator wire removed and a wire was navigated into the iliac vein. The  inner dilator was removed with the wire and an 035 Bentson wire was advanced into the IVC. Soft tissue tract dilation was performed. A 44 cm tip to cuff hemodialysis catheter was estimated.  1% lidocaine was used to anesthetize the skin and subcutaneous tissues lateral to the femoral puncture site, and a small incision was made with 11 blade scalpel. The catheter was then back tunneled  through the soft tissues. Catheter was then pulled through the tract. Peel-away sheath was placed over the wire, and then the catheter was passed through the peel-away sheath. Peel-away sheath was removed.  The 44 cm tip to cuff catheter was positioned just below the diaphragm and the red and blue port were aspirated. The catheter was nonfunctional.  Catheter was removed over wire with a 10 French 25 cm sheath placed over the wire. A pigtail catheter was then advanced over the wire and an IVC cavagram was performed.  A 33 cm tip to cuff catheter was selected to place in the mid IVC. This was placed over a stiff wire once the sheath was removed through a peel-away.  This was proven to be functional in the mid IVC.  Patient tolerated the procedure well and remained hemodynamically stable throughout.  No complications were encountered and no significant blood loss was encountered.  Final catheter positioning was confirmed and documented with a spot radiographic image. The catheter aspirates and flushes normally. The catheter was flushed with appropriate volume heparin dwells.  The catheter exit site was secured with a 0-Prolene retention suture. Venotomy was closed with Derma bond. Dressings were applied. The patient tolerated the procedure well without immediate post procedural complication.  IMPRESSION: Status post placement of right femoral vein tunneled hemodialysis catheter. This catheter is a 33 cm tip to cuff position in the mid IVC and is proven to be functional. Catheter is ready for use.  Before placement of the right femoral  vein catheter, a left IJ approach 23 cm tip to cuff catheter was placed which was nonfunctional, as well as a right femoral catheter measuring 44 cm tip to cuff, which was nonfunctional. Presumably this was related to patient anatomy of the right heart and hepatic IVC.  Signed,  Dulcy Fanny. Earleen Newport, DO  [facility]  [facility] Radiology  PLAN: For future catheter placement, is useful to know that the catheter placed from left IJ approach proved to be non-functional at multiple positions within the right heart, although this remains a possible option. In addition, a catheter positioned at the hepatic IVC was also proved to be nonfunctional on today's study, potentially related to the size of the IVC.   Electronically Signed   By: Corrie Mckusick D.O.   On: 12/07/2014 13:00   Ir US Guide Vasc Access Right  12/07/2014   CLINICAL DATA:  39 year old female presents for initiation of hemodialysis, and placement of tunneled hemodialysis catheter.  A previous tunneled right central catheter is in place from right IJ approach and [place] left IJ was initially selected for hemodialysis catheter placement.  Nonfunctioning hemodialysis catheter from left IJ approach necessitated exchange of the right-sided IJ central catheter for trouble shooting purposes.  EXAM: ULTRASOUND AND FLUOROSCOPIC GUIDED RIGHT TUNNELED CATHETER EXCHANGE.  FLUOROSCOPY TIME:  11 MINUTES 30 SECONDS  TECHNIQUE: The procedure, risks, benefits, and alternatives were explained to the patient. Questions regarding the procedure were encouraged and answered. The patient understands and consents to the procedure.  The patient is position in the supine position on the fluoroscopy table. Right neck and right upper chest were prepped and draped in the usual sterile fashion with sterile gloves counts and usual sterile technique. 1% lidocaine was used for local anesthesia.  For trouble shooting purposes the right tunneled IJ central  catheter was exchanged over a wire. The new catheter was trimmed at 21 cm length and confirmed in position within the right atrium after replacement.  Catheter was sutured in position with retention sutures.  After exchange of this catheter the case was continued with removal of left IJ hemodialysis catheter and placement of a right femoral line for hemodialysis initiation.  Patient tolerated the procedure well and remained hemodynamically stable throughout.  No complications were encountered and no significant blood loss was encountered.  COMPLICATIONS: None  IMPRESSION: Exchange of right IJ central catheter was performed during placement of a hemodialysis catheter for trouble shooting purposes when the initial left IJ approach HD catheter would not function.  Upon completion of exchange of the tunneled central catheter from right IJ approach, the catheter is functioning.  Signed,  Dulcy Fanny. Earleen Newport, DO  [facility]  [facility] Radiology   Electronically Signed   By: Corrie Mckusick D.O.   On: 12/07/2014 12:30   Medications: I have reviewed the patient's current medications. Scheduled Meds: . sodium chloride   Intravenous Once  . amLODipine  10 mg Oral BID  . atorvastatin  20 mg Oral q1800  . calcium acetate  1,334 mg Oral TID WC  . cephALEXin  250 mg Oral Q12H  . cyclobenzaprine  10 mg Oral QHS  . darbepoetin (ARANESP) injection - DIALYSIS  100 mcg Intravenous Q Sat-HD  . famotidine  40 mg Oral Daily  . heparin  5,000 Units Subcutaneous 3 times per day  . hydrALAZINE  10 mg Oral 3 times per day  . metoprolol succinate  50 mg Oral Daily  . mycophenolate  1,500 mg Oral BID  . predniSONE  20 mg Oral TID   Continuous Infusions:  PRN Meds:.acetaminophen, heparin, iohexol, ondansetron, oxyCODONE-acetaminophen, sodium chloride Assessment/Plan:  Thrombocytopenia and qualitative platelet dysfunction   - having intermittent bleeding from R femoral cath site and left  chest wall where IJ cath was attempted. Platelet dysfunction likely 2/2 to Uremia. 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 - STOP all heparin product (even at HD) for now. Ordered HIT panel. No anticoag for now. Has no thrombosis.  - try to correct platelet dysfunction with HD - monitor for bleeding. Already received 2x DDAVP, wouldn't recommend further DDAVP as can get tachyphylaxis with that.   Anemia: 2/2 to renal impairment + acute blood loss. AOCD baseline 10.9 on admission. Bleeding from multiple sites, oozing bleed rate after pressure bandages placed. hgb 7.1 on 9/10, now 8.9 with 2 unit pRBC.  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  - Continue cephalexin 240m BID, renally dosed for 4 days (12/10/14) for Klebsiella pneumoniae on urine Cx - Continue prednisone 265mPO TID - Continue Cellcept 150032mO BID - Continue zofran 4mg21mT q6hrs PRN - Continue PhosLo 1334mg66mWM - Repeat AM renal function panel, CBC - Hemodialysis per Nephrology, following pt and recs appreciated  Hyperkalemia: resolved with HD - Cardiac monitoring - Continue sodium bicarbonate 650mg 69mID  HTN: SBPs resistant to CCB and BB therapy, hydralazine started at 10mg P14mD with modest improvement in BP. BP stayed high with HD so far. - Continue Amlodipine 10mg PO58m - Continue Metoprolol succinate 50mg PO 7my - cont  Hydralazine to 25mg TID 10mk and flank pain: Pain present since renal biopsy. States this contributes to her insomnia. Flexeril no improvement in sleep  last night. - Percocet 5-330m q6hrs PRN - Flexeril 152mqHS PRN  SVT: s/p ablation 2014, has some recurrent palpitations in recent months, currently holter monitor on board. HLD: Atorvastatin 2069mPO  FEN: Renal diet, fluid restricted DVT ppx: [place] heparin FULL CODE  Dispo: Disposition deferred until improvement in clinical condition. Patient starting on hemodialysis and will require several days before stable for discharge.  The patient does have a current PCP (BeLin LandsmanD) and [facility].  The patient does not have transportation limitations that hinder transportation to clinic appointments.   LOS: 6 days   TasDellia NimsD 12/09/2014, 8:19 AM

## 2014-12-09 NOTE — Progress Notes (Signed)
Subjective:  Still having some bleeding from sites- had second HD yest- also made a liter of urine- BP is better - she ate all her breakfast  Objective Vital signs in last 24 hours: Filed Vitals:   12/08/14 2130 12/08/14 2200 12/08/14 2308 12/09/14 0503  BP: 160/90 160/90 160/88 159/75  Pulse: 104 100 98 88  Temp: 98 F (36.7 C)  98 F (36.7 C) 97.7 F (36.5 C)  TempSrc: Oral  Oral Oral  Resp:  _0 Height:      Weight:   111 kg (244 lb 11.4 oz) 109 kg (240 lb 4.8 oz)  SpO2: 100% 100% 100% 100%   Weight change: -3 kg (-6 lb 9.8 oz)  Intake/Output Summary (Last 24 hours) at 12/09/14 3435 Last data filed at 12/09/14 0400  Gross per 24 hour  Intake   1150 ml  Output   2340 ml  Net  -1190 ml    Assessment/Plan: 39 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 This hosp with continued progression with BUN in mid 100's and uremic symptoms so dialysis was started.   The biopsy suggest that this could be reversible so would continue with immune suppressing therapy for now but will support with HD.s/p PC and first HD 9/9 , second 9/10 and plan for third on 9/12   It is too soon to tell if her RI will be reversible.  I will declare her ESRD for op dialysis purposes however.  Is not good that upper cath not able to be placed- may not be good prognosis of perm access placement which may be needed this coming week 2. Renal hematoma after biopsy - per imaging does not seem to be worsening- hgb falling some- transfused 9/10 3. HTN- overall is high- maybe due to steroids. On amlodipine and toprol-  toprol increased to 50 daily and now hydralazine added.  Volume is likely playing a role as well.  Therefore HD should help-  is better 4. Anemia - worse- transfuse today- also add ESA 5. Hyperkalemia- resolved 6. UTI- on rocephin per primary team- culture pending - symptoms improved  7. Metabolic acidosis-  should resolve with HD as well  8. Hyperphos- have started phoslo - PTH pending 9. Bleeding uremic platelets ? Give dose of ddavp and continue with HD- also platelets down- no heparin with HD- checking HIT    Nikoli Nasser A    Labs: Basic Metabolic Panel:  Recent Labs Lab 12/07/14 1707 12/08/14 0525 12/08/14 1830  NA 137 138 135  K 4.8 4.5 4.6  CL 109 106 102  CO2 12* 18* 16*  GLUCOSE 122* 126* 137*  BUN 170* 129* 130*  CREATININE 8.34* 6.38* 6.63*  CALCIUM 8.3* 8.1* 8.3*  PHOS 12.3* 10.9* 11.9*   Liver Function Tests:  Recent Labs Lab 12/04/14 0538  12/07/14 1707 12/08/14 0525 12/08/14 1830  AST 19  --   --   --   --   ALT 18  --  26  --   --   ALKPHOS 48  --   --   --   --   BILITOT 0.3  --   --   --   --   PROT 6.6  --   --   --   --  ALBUMIN 3.0*  < > 3.1* 2.7* 2.9*  < > = values in this interval not displayed. No results for input(s): LIPASE, AMYLASE in the last 168 hours. No results for input(s): AMMONIA in the last 168 hours. CBC:  Recent Labs Lab 12/06/14 0515 12/07/14 1707 12/08/14 0525 12/08/14 1830 12/09/14 0515  WBC 12.7* 17.9* 11.1* 12.3* 8.8  NEUTROABS 12.2*  --  10.6*  --  8.3*  HGB 9.5* 9.3* 7.5* 7.1* 8.9*  HCT 28.3* 27.3* 21.5* 20.6* 25.3*  MCV 84.2 83.5 82.1 82.4 83.0  PLT 413* 373 128* 134* 79*   Cardiac Enzymes: No results for input(s): CKTOTAL, CKMB, CKMBINDEX, TROPONINI in the last 168 hours. CBG: No results for input(s): GLUCAP in the last 168 hours.  Iron Studies: No results for input(s): IRON, TIBC, TRANSFERRIN, FERRITIN in the last 72 hours. Studies/Results: Ir Pta Venous Left  12/07/2014   INDICATION: 39 year old female with end-stage renal disease has been referred for tunneled hemodialysis catheter placement for initiation of  hemodialysis.  She has had a prior right-sided tunneled IJ catheter placed for medications. The left IJ approach was selected.  EXAM: TUNNELED CENTRAL VENOUS HEMODIALYSIS CATHETER PLACEMENT WITH ULTRASOUND AND FLUOROSCOPIC GUIDANCE  ULTRASOUND GUIDED ACCESS OF LEFT INTERNAL JUGULAR VEIN AND RIGHT COMMON FEMORAL VEIN  MEDICATIONS: Ancef 2 gm IV; The IV antibiotic was given in an appropriate time interval prior to skin puncture.  CONTRAST:  None  ANESTHESIA/SEDATION: Versed 4.0 mg IV; Fentanyl 200 mcg IV  Total Moderate Sedation Time  One hundred forty-one minutes.  FLUOROSCOPY TIME:  11 minutes 30 seconds  COMPLICATIONS: None  PROCEDURE: Informed written consent was obtained from the patient after a discussion of the risks, benefits, and alternatives to treatment. Questions regarding the procedure were encouraged and answered. The left neck and chest were prepped with chlorhexidine in a sterile fashion, and a sterile drape was applied covering the operative field. Maximum barrier sterile technique with sterile gowns and gloves were used for the procedure. A timeout was performed prior to the initiation of the procedure.  After creating a small venotomy incision, a micropuncture kit was utilized to access the left internal jugular vein under direct, real-time ultrasound guidance after the overlying soft tissues were anesthetized with 1% lidocaine with epinephrine. Ultrasound image documentation was performed.  Once the micropuncture set was introduced, the inner dilator wire removed and a wire was navigated into the right heart. Serial dilation of the soft tissue tract was performed over the wire, and a Kumpe the catheter was advanced over the wire navigating the wire into the IVC. Once the catheter was advanced the IVC the wire was exchanged for stiff wire. Before removing the first wire a length of 23 cm tip to cuff was estimated.  1% lidocaine was used to anesthetize the chest, and a small incision was made with 11  blade scalpel. The catheter was then back tunneled through the soft tissues to the IJ puncture site. Catheter was then pulled through the tract. Peel-away sheath was placed over the stiff wire, and then the catheter was passed through the peel-away sheath. Peel-away sheath was removed.  Tip the catheter was positioned at the superior cavoatrial junction. Aspiration of the ports on the Palindrome catheter proved that the catheter was nonfunctional. Positioning of the catheter at multiple sites at the right atrium and superior cavoatrial junction prove to be nonfunctional.  Catheter was removed over a stiff wire which was subsequently advanced into the IVC. Balloon dilation of the right atrium was  performed with and 8 mm balloon through a 10 French sheath in the tissue tract. Once the balloon was removed a new 23 cm tip to cuff Palindrome catheter was placed with the tip positioned at several positions at the superior cavoatrial junction and the right atrium. The catheter was nonfunctional.  At this time the tunneled right IJ catheter was exchanged over a wire, for the possibility of a catheter associated thrombus as the obstruction to flow of the left-sided HD catheter. This is dictated under different heading.  The left IJ catheter site was abandoned at this point with removal of the catheter and sterile dressing placed at the left IJ puncture site and left chest wall.  The right femoral vein was selected for a new catheter site.  The right femoral region was then prepped with chlorhexidine in a sterile fashion, and a sterile drape was applied covering the operative field. Maximum barrier sterile technique with sterile gowns and gloves were used for the procedure.  After creating a small venotomy incision, a micropuncture kit was utilized to access the right femoral vein under direct, real-time ultrasound guidance after the overlying soft tissues were anesthetized with 1% lidocaine with epinephrine. Ultrasound image  documentation was performed.  Once the micropuncture set was introduced, the inner dilator wire removed and a wire was navigated into the iliac vein. The inner dilator was removed with the wire and an 035 Bentson wire was advanced into the IVC. Soft tissue tract dilation was performed. A 44 cm tip to cuff hemodialysis catheter was estimated.  1% lidocaine was used to anesthetize the skin and subcutaneous tissues lateral to the femoral puncture site, and a small incision was made with 11 blade scalpel. The catheter was then back tunneled through the soft tissues. Catheter was then pulled through the tract. Peel-away sheath was placed over the wire, and then the catheter was passed through the peel-away sheath. Peel-away sheath was removed.  The 44 cm tip to cuff catheter was positioned just below the diaphragm and the red and blue port were aspirated. The catheter was nonfunctional.  Catheter was removed over wire with a 10 French 25 cm sheath placed over the wire. A pigtail catheter was then advanced over the wire and an IVC cavagram was performed.  A 33 cm tip to cuff catheter was selected to place in the mid IVC. This was placed over a stiff wire once the sheath was removed through a peel-away.  This was proven to be functional in the mid IVC.  Patient tolerated the procedure well and remained hemodynamically stable throughout.  No complications were encountered and no significant blood loss was encountered.  Final catheter positioning was confirmed and documented with a spot radiographic image. The catheter aspirates and flushes normally. The catheter was flushed with appropriate volume heparin dwells.  The catheter exit site was secured with a 0-Prolene retention suture. Venotomy was closed with Derma bond. Dressings were applied. The patient tolerated the procedure well without immediate post procedural complication.  IMPRESSION: Status post placement of right femoral vein tunneled hemodialysis catheter. This  catheter is a 33 cm tip to cuff position in the mid IVC and is proven to be functional. Catheter is ready for use.  Before placement of the right femoral vein catheter, a left IJ approach 23 cm tip to cuff catheter was placed which was nonfunctional, as well as a right femoral catheter measuring 44 cm tip to cuff, which was nonfunctional. Presumably this was related to patient anatomy of  the right heart and hepatic IVC.  Signed,  Dulcy Fanny. Earleen Newport, DO  [facility]  [facility]  PLAN: For future catheter placement, is useful to know that the catheter placed from left IJ approach proved to be non-functional at multiple positions within the right heart, although this remains a possible option. In addition, a catheter positioned at the hepatic IVC was also proved to be nonfunctional on today's study, potentially related to the size of the IVC.   Electronically Signed   By: Corrie Mckusick D.O.   On: 12/07/2014 13:00   Ir Fluoro Guide Cv Line Left  12/07/2014   INDICATION: 39 year old female with end-stage renal disease has been referred for tunneled hemodialysis catheter placement for initiation of hemodialysis.  She has had a prior right-sided tunneled IJ catheter placed for medications. The left IJ approach was selected.  EXAM: TUNNELED CENTRAL VENOUS HEMODIALYSIS CATHETER PLACEMENT WITH ULTRASOUND AND FLUOROSCOPIC GUIDANCE  ULTRASOUND GUIDED ACCESS OF LEFT INTERNAL JUGULAR VEIN AND RIGHT COMMON FEMORAL VEIN  MEDICATIONS: Ancef 2 gm IV; The IV antibiotic was given in an appropriate time interval prior to skin puncture.  CONTRAST:  None  ANESTHESIA/SEDATION: Versed 4.0 mg IV; Fentanyl 200 mcg IV  Total Moderate Sedation Time  One hundred forty-one minutes.  FLUOROSCOPY TIME:  11 minutes 30 seconds  COMPLICATIONS: None  PROCEDURE: Informed written consent was obtained from the patient after a discussion of the risks, benefits, and alternatives to treatment. Questions  regarding the procedure were encouraged and answered. The left neck and chest were prepped with chlorhexidine in a sterile fashion, and a sterile drape was applied covering the operative field. Maximum barrier sterile technique with sterile gowns and gloves were used for the procedure. A timeout was performed prior to the initiation of the procedure.  After creating a small venotomy incision, a micropuncture kit was utilized to access the left internal jugular vein under direct, real-time ultrasound guidance after the overlying soft tissues were anesthetized with 1% lidocaine with epinephrine. Ultrasound image documentation was performed.  Once the micropuncture set was introduced, the inner dilator wire removed and a wire was navigated into the right heart. Serial dilation of the soft tissue tract was performed over the wire, and a Kumpe the catheter was advanced over the wire navigating the wire into the IVC. Once the catheter was advanced the IVC the wire was exchanged for stiff wire. Before removing the first wire a length of 23 cm tip to cuff was estimated.  1% lidocaine was used to anesthetize the chest, and a small incision was made with 11 blade scalpel. The catheter was then back tunneled through the soft tissues to the IJ puncture site. Catheter was then pulled through the tract. Peel-away sheath was placed over the stiff wire, and then the catheter was passed through the peel-away sheath. Peel-away sheath was removed.  Tip the catheter was positioned at the superior cavoatrial junction. Aspiration of the ports on the Palindrome catheter proved that the catheter was nonfunctional. Positioning of the catheter at multiple sites at the right atrium and superior cavoatrial junction prove to be nonfunctional.  Catheter was removed over a stiff wire which was subsequently advanced into the IVC. Balloon dilation of the right atrium was performed with and 8 mm balloon through a 10 French sheath in the tissue  tract. Once the balloon was removed a new 23 cm tip to cuff Palindrome catheter was placed with the tip positioned at several positions at the superior cavoatrial  junction and the right atrium. The catheter was nonfunctional.  At this time the tunneled right IJ catheter was exchanged over a wire, for the possibility of a catheter associated thrombus as the obstruction to flow of the left-sided HD catheter. This is dictated under different heading.  The left IJ catheter site was abandoned at this point with removal of the catheter and sterile dressing placed at the left IJ puncture site and left chest wall.  The right femoral vein was selected for a new catheter site.  The right femoral region was then prepped with chlorhexidine in a sterile fashion, and a sterile drape was applied covering the operative field. Maximum barrier sterile technique with sterile gowns and gloves were used for the procedure.  After creating a small venotomy incision, a micropuncture kit was utilized to access the right femoral vein under direct, real-time ultrasound guidance after the overlying soft tissues were anesthetized with 1% lidocaine with epinephrine. Ultrasound image documentation was performed.  Once the micropuncture set was introduced, the inner dilator wire removed and a wire was navigated into the iliac vein. The inner dilator was removed with the wire and an 035 Bentson wire was advanced into the IVC. Soft tissue tract dilation was performed. A 44 cm tip to cuff hemodialysis catheter was estimated.  1% lidocaine was used to anesthetize the skin and subcutaneous tissues lateral to the femoral puncture site, and a small incision was made with 11 blade scalpel. The catheter was then back tunneled through the soft tissues. Catheter was then pulled through the tract. Peel-away sheath was placed over the wire, and then the catheter was passed through the peel-away sheath. Peel-away sheath was removed.  The 44 cm tip to cuff  catheter was positioned just below the diaphragm and the red and blue port were aspirated. The catheter was nonfunctional.  Catheter was removed over wire with a 10 French 25 cm sheath placed over the wire. A pigtail catheter was then advanced over the wire and an IVC cavagram was performed.  A 33 cm tip to cuff catheter was selected to place in the mid IVC. This was placed over a stiff wire once the sheath was removed through a peel-away.  This was proven to be functional in the mid IVC.  Patient tolerated the procedure well and remained hemodynamically stable throughout.  No complications were encountered and no significant blood loss was encountered.  Final catheter positioning was confirmed and documented with a spot radiographic image. The catheter aspirates and flushes normally. The catheter was flushed with appropriate volume heparin dwells.  The catheter exit site was secured with a 0-Prolene retention suture. Venotomy was closed with Derma bond. Dressings were applied. The patient tolerated the procedure well without immediate post procedural complication.  IMPRESSION: Status post placement of right femoral vein tunneled hemodialysis catheter. This catheter is a 33 cm tip to cuff position in the mid IVC and is proven to be functional. Catheter is ready for use.  Before placement of the right femoral vein catheter, a left IJ approach 23 cm tip to cuff catheter was placed which was nonfunctional, as well as a right femoral catheter measuring 44 cm tip to cuff, which was nonfunctional. Presumably this was related to patient anatomy of the right heart and hepatic IVC.  Signed,  Dulcy Fanny. Earleen Newport, DO  [facility]  [facility]  PLAN: For future catheter placement, is useful to know that the catheter placed from left IJ approach proved to be  non-functional at multiple positions within the right heart, although this remains a possible option. In addition, a catheter  positioned at the hepatic IVC was also proved to be nonfunctional on today's study, potentially related to the size of the IVC.   Electronically Signed   By: Corrie Mckusick D.O.   On: 12/07/2014 13:00   Ir Fluoro Guide Cv Line Right  12/07/2014   INDICATION: 39 year old female with end-stage renal disease has been referred for tunneled hemodialysis catheter placement for initiation of hemodialysis.  She has had a prior right-sided tunneled IJ catheter placed for medications. The left IJ approach was selected.  EXAM: TUNNELED CENTRAL VENOUS HEMODIALYSIS CATHETER PLACEMENT WITH ULTRASOUND AND FLUOROSCOPIC GUIDANCE  ULTRASOUND GUIDED ACCESS OF LEFT INTERNAL JUGULAR VEIN AND RIGHT COMMON FEMORAL VEIN  MEDICATIONS: Ancef 2 gm IV; The IV antibiotic was given in an appropriate time interval prior to skin puncture.  CONTRAST:  None  ANESTHESIA/SEDATION: Versed 4.0 mg IV; Fentanyl 200 mcg IV  Total Moderate Sedation Time  One hundred forty-one minutes.  FLUOROSCOPY TIME:  11 minutes 30 seconds  COMPLICATIONS: None  PROCEDURE: Informed written consent was obtained from the patient after a discussion of the risks, benefits, and alternatives to treatment. Questions regarding the procedure were encouraged and answered. The left neck and chest were prepped with chlorhexidine in a sterile fashion, and a sterile drape was applied covering the operative field. Maximum barrier sterile technique with sterile gowns and gloves were used for the procedure. A timeout was performed prior to the initiation of the procedure.  After creating a small venotomy incision, a micropuncture kit was utilized to access the left internal jugular vein under direct, real-time ultrasound guidance after the overlying soft tissues were anesthetized with 1% lidocaine with epinephrine. Ultrasound image documentation was performed.  Once the micropuncture set was introduced, the inner dilator wire removed and a wire was navigated into the right heart. Serial  dilation of the soft tissue tract was performed over the wire, and a Kumpe the catheter was advanced over the wire navigating the wire into the IVC. Once the catheter was advanced the IVC the wire was exchanged for stiff wire. Before removing the first wire a length of 23 cm tip to cuff was estimated.  1% lidocaine was used to anesthetize the chest, and a small incision was made with 11 blade scalpel. The catheter was then back tunneled through the soft tissues to the IJ puncture site. Catheter was then pulled through the tract. Peel-away sheath was placed over the stiff wire, and then the catheter was passed through the peel-away sheath. Peel-away sheath was removed.  Tip the catheter was positioned at the superior cavoatrial junction. Aspiration of the ports on the Palindrome catheter proved that the catheter was nonfunctional. Positioning of the catheter at multiple sites at the right atrium and superior cavoatrial junction prove to be nonfunctional.  Catheter was removed over a stiff wire which was subsequently advanced into the IVC. Balloon dilation of the right atrium was performed with and 8 mm balloon through a 10 French sheath in the tissue tract. Once the balloon was removed a new 23 cm tip to cuff Palindrome catheter was placed with the tip positioned at several positions at the superior cavoatrial junction and the right atrium. The catheter was nonfunctional.  At this time the tunneled right IJ catheter was exchanged over a wire, for the possibility of a catheter associated thrombus as the obstruction to flow of the left-sided HD catheter. This is  dictated under different heading.  The left IJ catheter site was abandoned at this point with removal of the catheter and sterile dressing placed at the left IJ puncture site and left chest wall.  The right femoral vein was selected for a new catheter site.  The right femoral region was then prepped with chlorhexidine in a sterile fashion, and a sterile drape  was applied covering the operative field. Maximum barrier sterile technique with sterile gowns and gloves were used for the procedure.  After creating a small venotomy incision, a micropuncture kit was utilized to access the right femoral vein under direct, real-time ultrasound guidance after the overlying soft tissues were anesthetized with 1% lidocaine with epinephrine. Ultrasound image documentation was performed.  Once the micropuncture set was introduced, the inner dilator wire removed and a wire was navigated into the iliac vein. The inner dilator was removed with the wire and an 035 Bentson wire was advanced into the IVC. Soft tissue tract dilation was performed. A 44 cm tip to cuff hemodialysis catheter was estimated.  1% lidocaine was used to anesthetize the skin and subcutaneous tissues lateral to the femoral puncture site, and a small incision was made with 11 blade scalpel. The catheter was then back tunneled through the soft tissues. Catheter was then pulled through the tract. Peel-away sheath was placed over the wire, and then the catheter was passed through the peel-away sheath. Peel-away sheath was removed.  The 44 cm tip to cuff catheter was positioned just below the diaphragm and the red and blue port were aspirated. The catheter was nonfunctional.  Catheter was removed over wire with a 10 French 25 cm sheath placed over the wire. A pigtail catheter was then advanced over the wire and an IVC cavagram was performed.  A 33 cm tip to cuff catheter was selected to place in the mid IVC. This was placed over a stiff wire once the sheath was removed through a peel-away.  This was proven to be functional in the mid IVC.  Patient tolerated the procedure well and remained hemodynamically stable throughout.  No complications were encountered and no significant blood loss was encountered.  Final catheter positioning was confirmed and documented with a spot radiographic image. The catheter aspirates and flushes  normally. The catheter was flushed with appropriate volume heparin dwells.  The catheter exit site was secured with a 0-Prolene retention suture. Venotomy was closed with Derma bond. Dressings were applied. The patient tolerated the procedure well without immediate post procedural complication.  IMPRESSION: Status post placement of right femoral vein tunneled hemodialysis catheter. This catheter is a 33 cm tip to cuff position in the mid IVC and is proven to be functional. Catheter is ready for use.  Before placement of the right femoral vein catheter, a left IJ approach 23 cm tip to cuff catheter was placed which was nonfunctional, as well as a right femoral catheter measuring 44 cm tip to cuff, which was nonfunctional. Presumably this was related to patient anatomy of the right heart and hepatic IVC.  Signed,  Dulcy Fanny. Earleen Newport, DO  [facility]  [facility] Radiology  PLAN: For future catheter placement, is useful to know that the catheter placed from left IJ approach proved to be non-functional at multiple positions within the right heart, although this remains a possible option. In addition, a catheter positioned at the hepatic IVC was also proved to be nonfunctional on today's study, potentially related to the size of the IVC.  Electronically Signed   By: Corrie Mckusick D.O.   On: 12/07/2014 13:00   Ir Fluoro Guide Cv Line Right  12/07/2014   CLINICAL DATA:  39 year old female presents for initiation of hemodialysis, and placement of tunneled hemodialysis catheter.  A previous tunneled right central catheter is in place from right IJ approach and [place] left IJ was initially selected for hemodialysis catheter placement.  Nonfunctioning hemodialysis catheter from left IJ approach necessitated exchange of the right-sided IJ central catheter for trouble shooting purposes.  EXAM: ULTRASOUND AND FLUOROSCOPIC GUIDED RIGHT TUNNELED CATHETER EXCHANGE.  FLUOROSCOPY TIME:  11 MINUTES 30  SECONDS  TECHNIQUE: The procedure, risks, benefits, and alternatives were explained to the patient. Questions regarding the procedure were encouraged and answered. The patient understands and consents to the procedure.  The patient is position in the supine position on the fluoroscopy table. Right neck and right upper chest were prepped and draped in the usual sterile fashion with sterile gloves counts and usual sterile technique. 1% lidocaine was used for local anesthesia.  For trouble shooting purposes the right tunneled IJ central catheter was exchanged over a wire. The new catheter was trimmed at 21 cm length and confirmed in position within the right atrium after replacement.  Catheter was sutured in position with retention sutures.  After exchange of this catheter the case was continued with removal of left IJ hemodialysis catheter and placement of a right femoral line for hemodialysis initiation.  Patient tolerated the procedure well and remained hemodynamically stable throughout.  No complications were encountered and no significant blood loss was encountered.  COMPLICATIONS: None  IMPRESSION: Exchange of right IJ central catheter was performed during placement of a hemodialysis catheter for trouble shooting purposes when the initial left IJ approach HD catheter would not function.  Upon completion of exchange of the tunneled central catheter from right IJ approach, the catheter is functioning.  Signed,  Dulcy Fanny. Earleen Newport, DO  [facility]  [facility] Radiology   Electronically Signed   By: Corrie Mckusick D.O.   On: 12/07/2014 12:30   Ir Removal Tun Cv Cath W/o Fl  12/08/2014   INDICATION: 39 year old female with end-stage renal disease has been referred for tunneled hemodialysis catheter placement for initiation of hemodialysis.  She has had a prior right-sided tunneled IJ catheter placed for medications. The left IJ approach was selected.  EXAM: TUNNELED CENTRAL VENOUS  HEMODIALYSIS CATHETER PLACEMENT WITH ULTRASOUND AND FLUOROSCOPIC GUIDANCE  ULTRASOUND GUIDED ACCESS OF LEFT INTERNAL JUGULAR VEIN AND RIGHT COMMON FEMORAL VEIN  MEDICATIONS: Ancef 2 gm IV; The IV antibiotic was given in an appropriate time interval prior to skin puncture.  CONTRAST:  None  ANESTHESIA/SEDATION: Versed 4.0 mg IV; Fentanyl 200 mcg IV  Total Moderate Sedation Time  One hundred forty-one minutes.  FLUOROSCOPY TIME:  11 minutes 30 seconds  COMPLICATIONS: None  PROCEDURE: Informed written consent was obtained from the patient after a discussion of the risks, benefits, and alternatives to treatment. Questions regarding the procedure were encouraged and answered. The left neck and chest were prepped with chlorhexidine in a sterile fashion, and a sterile drape was applied covering the operative field. Maximum barrier sterile technique with sterile gowns and gloves were used for the procedure. A timeout was performed prior to the initiation of the procedure.  After creating a small venotomy incision, a micropuncture kit was utilized to access the left internal jugular vein under direct, real-time ultrasound guidance after the overlying soft tissues were anesthetized with 1%  lidocaine with epinephrine. Ultrasound image documentation was performed.  Once the micropuncture set was introduced, the inner dilator wire removed and a wire was navigated into the right heart. Serial dilation of the soft tissue tract was performed over the wire, and a Kumpe the catheter was advanced over the wire navigating the wire into the IVC. Once the catheter was advanced the IVC the wire was exchanged for stiff wire. Before removing the first wire a length of 23 cm tip to cuff was estimated.  1% lidocaine was used to anesthetize the chest, and a small incision was made with 11 blade scalpel. The catheter was then back tunneled through the soft tissues to the IJ puncture site. Catheter was then pulled through the tract. Peel-away  sheath was placed over the stiff wire, and then the catheter was passed through the peel-away sheath. Peel-away sheath was removed.  Tip the catheter was positioned at the superior cavoatrial junction. Aspiration of the ports on the Palindrome catheter proved that the catheter was nonfunctional. Positioning of the catheter at multiple sites at the right atrium and superior cavoatrial junction prove to be nonfunctional.  Catheter was removed over a stiff wire which was subsequently advanced into the IVC. Balloon dilation of the right atrium was performed with and 8 mm balloon through a 10 French sheath in the tissue tract. Once the balloon was removed a new 23 cm tip to cuff Palindrome catheter was placed with the tip positioned at several positions at the superior cavoatrial junction and the right atrium. The catheter was nonfunctional.  At this time the tunneled right IJ catheter was exchanged over a wire, for the possibility of a catheter associated thrombus as the obstruction to flow of the left-sided HD catheter. This is dictated under different heading.  The left IJ catheter site was abandoned at this point with removal of the catheter and sterile dressing placed at the left IJ puncture site and left chest wall.  The right femoral vein was selected for a new catheter site.  The right femoral region was then prepped with chlorhexidine in a sterile fashion, and a sterile drape was applied covering the operative field. Maximum barrier sterile technique with sterile gowns and gloves were used for the procedure.  After creating a small venotomy incision, a micropuncture kit was utilized to access the right femoral vein under direct, real-time ultrasound guidance after the overlying soft tissues were anesthetized with 1% lidocaine with epinephrine. Ultrasound image documentation was performed.  Once the micropuncture set was introduced, the inner dilator wire removed and a wire was navigated into the iliac vein. The  inner dilator was removed with the wire and an 035 Bentson wire was advanced into the IVC. Soft tissue tract dilation was performed. A 44 cm tip to cuff hemodialysis catheter was estimated.  1% lidocaine was used to anesthetize the skin and subcutaneous tissues lateral to the femoral puncture site, and a small incision was made with 11 blade scalpel. The catheter was then back tunneled through the soft tissues. Catheter was then pulled through the tract. Peel-away sheath was placed over the wire, and then the catheter was passed through the peel-away sheath. Peel-away sheath was removed.  The 44 cm tip to cuff catheter was positioned just below the diaphragm and the red and blue port were aspirated. The catheter was nonfunctional.  Catheter was removed over wire with a 10 French 25 cm sheath placed over the wire. A pigtail catheter was then advanced over the wire and  an IVC cavagram was performed.  A 33 cm tip to cuff catheter was selected to place in the mid IVC. This was placed over a stiff wire once the sheath was removed through a peel-away.  This was proven to be functional in the mid IVC.  Patient tolerated the procedure well and remained hemodynamically stable throughout.  No complications were encountered and no significant blood loss was encountered.  Final catheter positioning was confirmed and documented with a spot radiographic image. The catheter aspirates and flushes normally. The catheter was flushed with appropriate volume heparin dwells.  The catheter exit site was secured with a 0-Prolene retention suture. Venotomy was closed with Derma bond. Dressings were applied. The patient tolerated the procedure well without immediate post procedural complication.  IMPRESSION: Status post placement of right femoral vein tunneled hemodialysis catheter. This catheter is a 33 cm tip to cuff position in the mid IVC and is proven to be functional. Catheter is ready for use.  Before placement of the right femoral  vein catheter, a left IJ approach 23 cm tip to cuff catheter was placed which was nonfunctional, as well as a right femoral catheter measuring 44 cm tip to cuff, which was nonfunctional. Presumably this was related to patient anatomy of the right heart and hepatic IVC.  Signed,  Dulcy Fanny. Earleen Newport, DO  [facility]  [facility] Radiology  PLAN: For future catheter placement, is useful to know that the catheter placed from left IJ approach proved to be non-functional at multiple positions within the right heart, although this remains a possible option. In addition, a catheter positioned at the hepatic IVC was also proved to be nonfunctional on today's study, potentially related to the size of the IVC.   Electronically Signed   By: Corrie Mckusick D.O.   On: 12/07/2014 13:00   Ir US Guide Vasc Access Left  12/07/2014   INDICATION: 39 year old female with end-stage renal disease has been referred for tunneled hemodialysis catheter placement for initiation of hemodialysis.  She has had a prior right-sided tunneled IJ catheter placed for medications. The left IJ approach was selected.  EXAM: TUNNELED CENTRAL VENOUS HEMODIALYSIS CATHETER PLACEMENT WITH ULTRASOUND AND FLUOROSCOPIC GUIDANCE  ULTRASOUND GUIDED ACCESS OF LEFT INTERNAL JUGULAR VEIN AND RIGHT COMMON FEMORAL VEIN  MEDICATIONS: Ancef 2 gm IV; The IV antibiotic was given in an appropriate time interval prior to skin puncture.  CONTRAST:  None  ANESTHESIA/SEDATION: Versed 4.0 mg IV; Fentanyl 200 mcg IV  Total Moderate Sedation Time  One hundred forty-one minutes.  FLUOROSCOPY TIME:  11 minutes 30 seconds  COMPLICATIONS: None  PROCEDURE: Informed written consent was obtained from the patient after a discussion of the risks, benefits, and alternatives to treatment. Questions regarding the procedure were encouraged and answered. The left neck and chest were prepped with chlorhexidine in a sterile fashion, and a sterile drape was  applied covering the operative field. Maximum barrier sterile technique with sterile gowns and gloves were used for the procedure. A timeout was performed prior to the initiation of the procedure.  After creating a small venotomy incision, a micropuncture kit was utilized to access the left internal jugular vein under direct, real-time ultrasound guidance after the overlying soft tissues were anesthetized with 1% lidocaine with epinephrine. Ultrasound image documentation was performed.  Once the micropuncture set was introduced, the inner dilator wire removed and a wire was navigated into the right heart. Serial dilation of the soft tissue tract was performed over the wire, and  a Kumpe the catheter was advanced over the wire navigating the wire into the IVC. Once the catheter was advanced the IVC the wire was exchanged for stiff wire. Before removing the first wire a length of 23 cm tip to cuff was estimated.  1% lidocaine was used to anesthetize the chest, and a small incision was made with 11 blade scalpel. The catheter was then back tunneled through the soft tissues to the IJ puncture site. Catheter was then pulled through the tract. Peel-away sheath was placed over the stiff wire, and then the catheter was passed through the peel-away sheath. Peel-away sheath was removed.  Tip the catheter was positioned at the superior cavoatrial junction. Aspiration of the ports on the Palindrome catheter proved that the catheter was nonfunctional. Positioning of the catheter at multiple sites at the right atrium and superior cavoatrial junction prove to be nonfunctional.  Catheter was removed over a stiff wire which was subsequently advanced into the IVC. Balloon dilation of the right atrium was performed with and 8 mm balloon through a 10 French sheath in the tissue tract. Once the balloon was removed a new 23 cm tip to cuff Palindrome catheter was placed with the tip positioned at several positions at the superior  cavoatrial junction and the right atrium. The catheter was nonfunctional.  At this time the tunneled right IJ catheter was exchanged over a wire, for the possibility of a catheter associated thrombus as the obstruction to flow of the left-sided HD catheter. This is dictated under different heading.  The left IJ catheter site was abandoned at this point with removal of the catheter and sterile dressing placed at the left IJ puncture site and left chest wall.  The right femoral vein was selected for a new catheter site.  The right femoral region was then prepped with chlorhexidine in a sterile fashion, and a sterile drape was applied covering the operative field. Maximum barrier sterile technique with sterile gowns and gloves were used for the procedure.  After creating a small venotomy incision, a micropuncture kit was utilized to access the right femoral vein under direct, real-time ultrasound guidance after the overlying soft tissues were anesthetized with 1% lidocaine with epinephrine. Ultrasound image documentation was performed.  Once the micropuncture set was introduced, the inner dilator wire removed and a wire was navigated into the iliac vein. The inner dilator was removed with the wire and an 035 Bentson wire was advanced into the IVC. Soft tissue tract dilation was performed. A 44 cm tip to cuff hemodialysis catheter was estimated.  1% lidocaine was used to anesthetize the skin and subcutaneous tissues lateral to the femoral puncture site, and a small incision was made with 11 blade scalpel. The catheter was then back tunneled through the soft tissues. Catheter was then pulled through the tract. Peel-away sheath was placed over the wire, and then the catheter was passed through the peel-away sheath. Peel-away sheath was removed.  The 44 cm tip to cuff catheter was positioned just below the diaphragm and the red and blue port were aspirated. The catheter was nonfunctional.  Catheter was removed over wire  with a 10 French 25 cm sheath placed over the wire. A pigtail catheter was then advanced over the wire and an IVC cavagram was performed.  A 33 cm tip to cuff catheter was selected to place in the mid IVC. This was placed over a stiff wire once the sheath was removed through a peel-away.  This was proven to be  functional in the mid IVC.  Patient tolerated the procedure well and remained hemodynamically stable throughout.  No complications were encountered and no significant blood loss was encountered.  Final catheter positioning was confirmed and documented with a spot radiographic image. The catheter aspirates and flushes normally. The catheter was flushed with appropriate volume heparin dwells.  The catheter exit site was secured with a 0-Prolene retention suture. Venotomy was closed with Derma bond. Dressings were applied. The patient tolerated the procedure well without immediate post procedural complication.  IMPRESSION: Status post placement of right femoral vein tunneled hemodialysis catheter. This catheter is a 33 cm tip to cuff position in the mid IVC and is proven to be functional. Catheter is ready for use.  Before placement of the right femoral vein catheter, a left IJ approach 23 cm tip to cuff catheter was placed which was nonfunctional, as well as a right femoral catheter measuring 44 cm tip to cuff, which was nonfunctional. Presumably this was related to patient anatomy of the right heart and hepatic IVC.  Signed,  Dulcy Fanny. Earleen Newport, DO  [facility]  [facility] Radiology  PLAN: For future catheter placement, is useful to know that the catheter placed from left IJ approach proved to be non-functional at multiple positions within the right heart, although this remains a possible option. In addition, a catheter positioned at the hepatic IVC was also proved to be nonfunctional on today's study, potentially related to the size of the IVC.   Electronically Signed   By:  Corrie Mckusick D.O.   On: 12/07/2014 13:00   Ir US Guide Vasc Access Right  12/07/2014   CLINICAL DATA:  39 year old female presents for initiation of hemodialysis, and placement of tunneled hemodialysis catheter.  A previous tunneled right central catheter is in place from right IJ approach and [place] left IJ was initially selected for hemodialysis catheter placement.  Nonfunctioning hemodialysis catheter from left IJ approach necessitated exchange of the right-sided IJ central catheter for trouble shooting purposes.  EXAM: ULTRASOUND AND FLUOROSCOPIC GUIDED RIGHT TUNNELED CATHETER EXCHANGE.  FLUOROSCOPY TIME:  11 MINUTES 30 SECONDS  TECHNIQUE: The procedure, risks, benefits, and alternatives were explained to the patient. Questions regarding the procedure were encouraged and answered. The patient understands and consents to the procedure.  The patient is position in the supine position on the fluoroscopy table. Right neck and right upper chest were prepped and draped in the usual sterile fashion with sterile gloves counts and usual sterile technique. 1% lidocaine was used for local anesthesia.  For trouble shooting purposes the right tunneled IJ central catheter was exchanged over a wire. The new catheter was trimmed at 21 cm length and confirmed in position within the right atrium after replacement.  Catheter was sutured in position with retention sutures.  After exchange of this catheter the case was continued with removal of left IJ hemodialysis catheter and placement of a right femoral line for hemodialysis initiation.  Patient tolerated the procedure well and remained hemodynamically stable throughout.  No complications were encountered and no significant blood loss was encountered.  COMPLICATIONS: None  IMPRESSION: Exchange of right IJ central catheter was performed during placement of a hemodialysis catheter for trouble shooting purposes when the initial left IJ approach HD catheter would not function.  Upon  completion of exchange of the tunneled central catheter from right IJ approach, the catheter is functioning.  Signed,  Dulcy Fanny. Earleen Newport, DO  [facility]  [facility] Radiology  Electronically Signed   By: Corrie Mckusick D.O.   On: 12/07/2014 12:30   Medications: Infusions:    Scheduled Medications: . sodium chloride   Intravenous Once  . amLODipine  10 mg Oral BID  . atorvastatin  20 mg Oral q1800  . calcium acetate  1,334 mg Oral TID WC  . cephALEXin  250 mg Oral Q12H  . cyclobenzaprine  10 mg Oral QHS  . darbepoetin (ARANESP) injection - DIALYSIS  100 mcg Intravenous Q Sat-HD  . famotidine  40 mg Oral Daily  . hydrALAZINE  10 mg Oral 3 times per day  . metoprolol succinate  50 mg Oral Daily  . mycophenolate  1,500 mg Oral BID  . predniSONE  20 mg Oral TID    have reviewed scheduled and prn medications.  Physical Exam: General: NAD Heart: RRR attempted line in upper chest with pressure dressings Lungs: clear Abdomen: soft, non tender Extremities: now with edema- right sided femoral  PC- pressure dressing     12/09/2014,9:37 AM  LOS: 6 days

## 2014-12-09 NOTE — Progress Notes (Signed)
Pt. C/O chest tightness & felt like her heart was racing.  Checked telemetry box & HR was 98-103.  VSS - Blood pressure 184/88, pulse 105, temperature 98.1 F (36.7 C), temperature source Oral, resp. rate 18, height 5\' 3"  (1.6 m), weight 109 kg (240 lb 4.8 oz), last menstrual period 11/17/2014, SpO2 100 %., R/A.  Paged [phone] and received call back from Dr. Juleen China and informed of above.  Received order for EKG.  Will carry out and continue to monitor.  Alphonzo Lemmings, RN

## 2014-12-10 DIAGNOSIS — D696 Thrombocytopenia, unspecified: Secondary | ICD-10-CM

## 2014-12-10 DIAGNOSIS — Z992 Dependence on renal dialysis: Secondary | ICD-10-CM

## 2014-12-10 DIAGNOSIS — I471 Supraventricular tachycardia: Secondary | ICD-10-CM

## 2014-12-10 DIAGNOSIS — D691 Qualitative platelet defects: Secondary | ICD-10-CM

## 2014-12-10 DIAGNOSIS — G4700 Insomnia, unspecified: Secondary | ICD-10-CM

## 2014-12-10 DIAGNOSIS — N189 Chronic kidney disease, unspecified: Secondary | ICD-10-CM

## 2014-12-10 DIAGNOSIS — D62 Acute posthemorrhagic anemia: Secondary | ICD-10-CM

## 2014-12-10 DIAGNOSIS — I129 Hypertensive chronic kidney disease with stage 1 through stage 4 chronic kidney disease, or unspecified chronic kidney disease: Secondary | ICD-10-CM

## 2014-12-10 LAB — CBC WITH DIFFERENTIAL/PLATELET
BASOS ABS: 0 10*3/uL (ref 0.0–0.1)
BASOS PCT: 0 % (ref 0–1)
Eosinophils Absolute: 0 10*3/uL (ref 0.0–0.7)
Eosinophils Relative: 0 % (ref 0–5)
HEMATOCRIT: 25.4 % — AB (ref 36.0–46.0)
HEMOGLOBIN: 9.1 g/dL — AB (ref 12.0–15.0)
LYMPHS PCT: 4 % — AB (ref 12–46)
Lymphs Abs: 0.5 10*3/uL — ABNORMAL LOW (ref 0.7–4.0)
MCH: 30 pg (ref 26.0–34.0)
MCHC: 35.8 g/dL (ref 30.0–36.0)
MCV: 83.8 fL (ref 78.0–100.0)
Monocytes Absolute: 0.3 10*3/uL (ref 0.1–1.0)
Monocytes Relative: 3 % (ref 3–12)
NEUTROS ABS: 12.4 10*3/uL — AB (ref 1.7–7.7)
NEUTROS PCT: 94 % — AB (ref 43–77)
Platelets: 114 10*3/uL — ABNORMAL LOW (ref 150–400)
RBC: 3.03 MIL/uL — AB (ref 3.87–5.11)
RDW: 13.6 % (ref 11.5–15.5)
WBC: 13.2 10*3/uL — AB (ref 4.0–10.5)

## 2014-12-10 LAB — RENAL FUNCTION PANEL
ALBUMIN: 3 g/dL — AB (ref 3.5–5.0)
ANION GAP: 13 (ref 5–15)
BUN: 106 mg/dL — ABNORMAL HIGH (ref 6–20)
CO2: 21 mmol/L — ABNORMAL LOW (ref 22–32)
Calcium: 8.4 mg/dL — ABNORMAL LOW (ref 8.9–10.3)
Chloride: 101 mmol/L (ref 101–111)
Creatinine, Ser: 5.78 mg/dL — ABNORMAL HIGH (ref 0.44–1.00)
GFR, EST AFRICAN AMERICAN: 10 mL/min — AB (ref >60)
GFR, EST NON AFRICAN AMERICAN: 8 mL/min — AB (ref >60)
GLUCOSE: 150 mg/dL — AB (ref 65–99)
PHOSPHORUS: 9.6 mg/dL — AB (ref 2.5–4.6)
POTASSIUM: 4.6 mmol/L (ref 3.5–5.1)
Sodium: 135 mmol/L (ref 135–145)

## 2014-12-10 MED ORDER — NEPRO/CARBSTEADY PO LIQD: 237.0000 mL | Freq: Two times a day (BID) | ORAL | Status: DC

## 2014-12-10 NOTE — Progress Notes (Signed)
Patient voided and post void residual in bladder was 233 ml, intermittent catheter was used on patient and 300 ml was taken out. Patient has voided frequently throughout night.

## 2014-12-10 NOTE — Progress Notes (Signed)
Spoke with [name] in HD, clipping process has been initiated today, she anticipates to hear something back tomorrow.

## 2014-12-10 NOTE — Progress Notes (Signed)
Patient ID: [name], female   DOB: October 08, 1975, 39 y.o.   MRN: [id number]  [facility] Progress Note   Assessment/ Plan:   1. 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 With uremic symptoms at this admission for which dialysis was started.To continue with immune suppressing therapy for SLE (potential renal recovery- but chances low) and will support with HD.s/p PC and first HD 9/9 , second 9/10 and plan for third today- will assess daily for HD needs- and place on MWF schedule 2. 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 3. HTN- overall is high- maybe due to steroids. On amlodipine and toprol- toprol increased to 50 daily and now hydralazine added. Volume is likely playing a role as well. Therefore HD should help- is better 4. Anemia - improved with ESA/PRBCs and slower rate of bleeding from various sites 5. Klebsiella UTI- on rocephin per primary team- symptoms improved and without markers of sepsis 6. 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- PTH pending  Subjective:   Reports to be feeling fair- anxious about long term HD   Objective:   BP 115/65 mmHg  Pulse 101  Temp(Src) 98.3 F (36.8 C) (Oral)  Resp 18  Ht 5\' 3"  (1.6 m)  Wt 112.2 kg (247 lb 5.7 oz)  BMI 43.83 kg/m2  SpO2 100%  LMP 11/17/2014 (Approximate)  Intake/Output Summary (Last 24 hours) at 12/10/14 0947 Last data filed at 12/10/14 0620  Gross per 24 hour  Intake    240 ml  Output    833 ml  Net   -593 ml   Weight change: 0 kg (0 lb)  Physical Exam: XBD:ZHGDJMEQAST on HD MHD:QQIWL regular tachycardia, s1 and s2 with ESM Resp:CTA bilaterally, no rales/rhonchi NLG:XQJJ,HERDE, NT YCX:KGYJE-5+ LE edema  Imaging: No results found.  Labs: BMET  Recent Labs Lab 12/05/14 0442 12/05/14 1650  12/06/14 0515 12/07/14 1707 12/08/14 0525 12/08/14 1830 12/10/14 0621  NA 135 136 134* 137 138 135 135  K 6.0* 4.6 4.6 4.8 4.5 4.6 4.6  CL 107 109 105 109 106 102 101  CO2 14* 12* 13* 12* 18* 16* 21*  GLUCOSE 157* 131* 155* 122* 126* 137* 150*  BUN 148* 153* 154* 170* 129* 130* 106*  CREATININE 8.04* 8.22* 8.08* 8.34* 6.38* 6.63* 5.78*  CALCIUM 8.1* 8.1* 8.0* 8.3* 8.1* 8.3* 8.4*  PHOS 10.1*  --  11.0* 12.3* 10.9* 11.9* 9.6*   CBC  Recent Labs Lab 12/06/14 0515  12/08/14 0525 12/08/14 1830 12/09/14 0515 12/10/14 0622  WBC 12.7*  < > 11.1* 12.3* 8.8 13.2*  NEUTROABS 12.2*  --  10.6*  --  8.3* 12.4*  HGB 9.5*  < > 7.5* 7.1* 8.9* 9.1*  HCT 28.3*  < > 21.5* 20.6* 25.3* 25.4*  MCV 84.2  < > 82.1 82.4 83.0 83.8  PLT 413*  < > 128* 134* 79* 114*  < > = values in this interval not displayed.  Medications:    . sodium chloride   Intravenous Once  . amLODipine  10 mg Oral BID  . atorvastatin  20 mg Oral q1800  . calcium acetate  1,334 mg Oral TID WC  . cephALEXin  250 mg Oral Q12H  . cyclobenzaprine  10 mg Oral QHS  . darbepoetin (ARANESP) injection - DIALYSIS  100 mcg Intravenous Q Sat-HD  . famotidine  40 mg  Oral Daily  . hydrALAZINE  50 mg Oral 3 times per day  . metoprolol succinate  50 mg Oral Daily  . mycophenolate  1,500 mg Oral BID  . predniSONE  20 mg Oral TID   Elmarie Shiley, MD 12/10/2014, 9:47 AM '

## 2014-12-10 NOTE — Progress Notes (Signed)
12/10/2014 11:19 AM Hemodialysis Outpatient Note: after beginning the "CLIP" process for this patient the dialysis center called to say that without a permanent access being placed they would not accept the patient. Nephrology has been informed. Thank you. Gordy Savers

## 2014-12-10 NOTE — Progress Notes (Signed)
Subjective: She feels tired after dialysis, but overall improved compared to yesterday. Continues to note slow oozing bleed from R femoral HD catheter site. Bleeding is stopped at PICC line and attempted LIJ insertion site. Urine output is liters improved compared to previous days.  Objective: Vital signs in last 24 hours: Filed Vitals:   12/10/14 1114 12/10/14 1141 12/10/14 1216 12/10/14 1310  BP: 143/87 141/88 137/89 152/80  Pulse: 109 110 107 100  Temp:   98.4 F (36.9 C) 98.5 F (36.9 C)  TempSrc:   Oral Oral  Resp:   18 20  Height:      Weight:      SpO2:   100% 98%   Weight change: 0 kg (0 lb)  Intake/Output Summary (Last 24 hours) at 12/10/14 1630 Last data filed at 12/10/14 1216  Gross per 24 hour  Intake    120 ml  Output   3851 ml  Net  -3731 ml   GENERAL- alert, orientedx3, NAD HEENT- Atraumatic, oral mucosa appears moist CARDIAC- RRR, no murmurs, rubs or gallops. RESP- CTAB, no wheezes or crackles. ABDOMEN- No left sided abdominal TTP on exam, but flank and back remain TTP, no guarding or rebound, normoactive bowel sounds EXTREMITIES- No pedal edema SKIN- Warm, dry PSYCH- Appropriate, calmed and does not seem overwhelmed visiting today  Lab Results: Basic Metabolic Panel:  Recent Labs Lab 12/08/14 1830 12/10/14 0621  NA 135 135  K 4.6 4.6  CL 102 101  CO2 16* 21*  GLUCOSE 137* 150*  BUN 130* 106*  CREATININE 6.63* 5.78*  CALCIUM 8.3* 8.4*  PHOS 11.9* 9.6*   Liver Function Tests:  Recent Labs Lab 12/04/14 0538  12/07/14 1707  12/08/14 1830 12/10/14 0621  AST 19  --   --   --   --   --   ALT 18  --  26  --   --   --   ALKPHOS 48  --   --   --   --   --   BILITOT 0.3  --   --   --   --   --   PROT 6.6  --   --   --   --   --   ALBUMIN 3.0*  < > 3.1*  < > 2.9* 3.0*  < > = values in this interval not displayed. No results for input(s): LIPASE, AMYLASE in the last 168 hours. No results for input(s): AMMONIA in the last 168  hours. CBC:  Recent Labs Lab 12/09/14 0515 12/10/14 0622  WBC 8.8 13.2*  NEUTROABS 8.3* 12.4*  HGB 8.9* 9.1*  HCT 25.3* 25.4*  MCV 83.0 83.8  PLT 79* 114*   Cardiac Enzymes: No results for input(s): CKTOTAL, CKMB, CKMBINDEX, TROPONINI in the last 168 hours. BNP: No results for input(s): PROBNP in the last 168 hours. D-Dimer: No results for input(s): DDIMER in the last 168 hours. CBG: No results for input(s): GLUCAP in the last 168 hours. Hemoglobin A1C: No results for input(s): HGBA1C in the last 168 hours. Fasting Lipid Panel: No results for input(s): CHOL, HDL, LDLCALC, TRIG, CHOLHDL, LDLDIRECT in the last 168 hours. Thyroid Function Tests: No results for input(s): TSH, T4TOTAL, FREET4, T3FREE, THYROIDAB in the last 168 hours. Coagulation:  Recent Labs Lab 12/06/14 1440  LABPROT 13.9  INR 1.05   Anemia Panel: No results for input(s): VITAMINB12, FOLATE, FERRITIN, TIBC, IRON, RETICCTPCT in the last 168 hours. Urine Drug Screen: Drugs of Abuse  Component Value Date/Time   LABOPIA NONE DETECTED 07/21/2012 0215   COCAINSCRNUR NONE DETECTED 07/21/2012 0215   LABBENZ NONE DETECTED 07/21/2012 0215   AMPHETMU NONE DETECTED 07/21/2012 0215   THCU NONE DETECTED 07/21/2012 0215   LABBARB NONE DETECTED 07/21/2012 0215    Alcohol Level: No results for input(s): ETH in the last 168 hours. Urinalysis: No results for input(s): COLORURINE, LABSPEC, PHURINE, GLUCOSEU, HGBUR, BILIRUBINUR, KETONESUR, PROTEINUR, UROBILINOGEN, NITRITE, LEUKOCYTESUR in the last 168 hours.  Invalid input(s): APPERANCEUR  Micro Results: Recent Results (from the past 240 hour(s))  Urine culture     Status: None   Collection Time: 12/03/14  1:44 PM  Result Value Ref Range Status   Specimen Description URINE, CLEAN CATCH  Final   Special Requests Normal  Final   Culture >=100,000 COLONIES/mL KLEBSIELLA PNEUMONIAE  Final   Report Status 12/06/2014 FINAL  Final   Organism ID, Bacteria  KLEBSIELLA PNEUMONIAE  Final      Susceptibility   Klebsiella pneumoniae - MIC*    AMPICILLIN 4 RESISTANT Resistant     CEFAZOLIN <=4 SENSITIVE Sensitive     CEFTRIAXONE <=1 SENSITIVE Sensitive     CIPROFLOXACIN <=0.25 SENSITIVE Sensitive     GENTAMICIN <=1 SENSITIVE Sensitive     IMIPENEM <=0.25 SENSITIVE Sensitive     NITROFURANTOIN 64 INTERMEDIATE Intermediate     TRIMETH/SULFA <=20 SENSITIVE Sensitive     AMPICILLIN/SULBACTAM 4 SENSITIVE Sensitive     PIP/TAZO 8 SENSITIVE Sensitive     * >=100,000 COLONIES/mL KLEBSIELLA PNEUMONIAE   Studies/Results: No results found. Medications: I have reviewed the patient's current medications. Scheduled Meds: . sodium chloride   Intravenous Once  . amLODipine  10 mg Oral BID  . atorvastatin  20 mg Oral q1800  . calcium acetate  1,334 mg Oral TID WC  . cyclobenzaprine  10 mg Oral QHS  . darbepoetin (ARANESP) injection - DIALYSIS  100 mcg Intravenous Q Sat-HD  . famotidine  40 mg Oral Daily  . hydrALAZINE  50 mg Oral 3 times per day  . metoprolol succinate  50 mg Oral Daily  . mycophenolate  1,500 mg Oral BID  . 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 Will require permanent access placement for CLIP. Rate limiting to this step will likely be her coagulopathy. BUN 106 today, K 4.6, CO2 21. Urine output improved today which is promising that she may regain more renal function as her UTI resolves and lupus nephritis continues to be treated. - continue HD per nephro. MWF schedule most likely - Last day cephalexin 250mg  BID, renally dosed for 4 days (12/10/14) for Klebsiella pneumoniae on urine Cx - Continue prednisone 20mg  PO TID - Continue Cellcept 1500mg  PO BID - Continue zofran 4mg  ODT q6hrs PRN - Continue PhosLo 1334mg   TIDWM - Repeat AM renal function panel, CBC - Nephrology following pt, recs appreciated  Thrombocytopenia and qualitative platelet dysfunction  Platelets nadir to 79 2 days ago. Also likely dysfunctional as BUN was 170. Received 2 doses DDAVP and 3 rounds of dialysis to date. At this point pressure dressings and transfusion as needed are probably best plan. 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 AOCD baseline 10.9 on admission. Bleeding from multiple  sites, oozing bleed rate after pressure bandages placed. Less bleeding than 1-2 days ago. Hgb 7.1 on 9/10, now 9.1. - Daily CBC only - Bed rest activity level - Continue transfuse as needed  Hyperkalemia: resolved with HD - Cardiac monitoring - Continue sodium bicarbonate 650mg  PO BID  HTN: SBPs resistant to CCB and BB therapy, hydralazine started at 10mg  PO TID with modest improvement in BP. BP stayed high with HD so far. - Continue Amlodipine 10mg  PO BID - Continue Metoprolol succinate 50mg  PO daily - Continue Hydralazine to 50mg  TID  Back and flank pain: Pain present since renal biopsy. States this contributes to her insomnia. - Percocet 5-325mg  q6hrs PRN - Flexeril 10mg  qHS PRN  SVT: s/p ablation 2014, has some recurrent palpitations in recent months, currently holter monitor on board. She is persistently tachycardic during this hospital course, without symptomatic complaint. HLD: Atorvastatin 20mg  PO  FEN: Renal diet, fluid restricted DVT ppx: [place] heparin FULL CODE  Dispo: Disposition deferred until improvement in clinical condition. Patient starting on hemodialysis and will require several days before stable for discharge.  The patient does have a current PCP Lin Landsman, MD) and does not need an [facility] hospital follow-up appointment after discharge.  The patient does not have transportation limitations that hinder  transportation to clinic appointments.   LOS: 7 days   Collier Salina, MD 12/10/2014, 4:30 PM

## 2014-12-10 NOTE — Procedures (Signed)
Patient seen on Hemodialysis. QB 325, UF goal 3.5L Treatment adjusted as needed.  Elmarie Shiley MD [facility]. Office # [phone] Pager # [phone] 9:58 AM

## 2014-12-11 DIAGNOSIS — N185 Chronic kidney disease, stage 5: Secondary | ICD-10-CM

## 2014-12-11 LAB — BASIC METABOLIC PANEL
ANION GAP: 12 (ref 5–15)
BUN: 65 mg/dL — ABNORMAL HIGH (ref 6–20)
CALCIUM: 8.6 mg/dL — AB (ref 8.9–10.3)
CO2: 24 mmol/L (ref 22–32)
Chloride: 99 mmol/L — ABNORMAL LOW (ref 101–111)
Creatinine, Ser: 4.82 mg/dL — ABNORMAL HIGH (ref 0.44–1.00)
GFR calc Af Amer: 12 mL/min — ABNORMAL LOW (ref >60)
GFR calc non Af Amer: 11 mL/min — ABNORMAL LOW (ref >60)
GLUCOSE: 143 mg/dL — AB (ref 65–99)
Potassium: 4.5 mmol/L (ref 3.5–5.1)
Sodium: 135 mmol/L (ref 135–145)

## 2014-12-11 LAB — CBC WITH DIFFERENTIAL/PLATELET
BASOS ABS: 0 10*3/uL (ref 0.0–0.1)
Basophils Relative: 0 % (ref 0–1)
Eosinophils Absolute: 0 10*3/uL (ref 0.0–0.7)
Eosinophils Relative: 0 % (ref 0–5)
HEMATOCRIT: 24 % — AB (ref 36.0–46.0)
Hemoglobin: 8.1 g/dL — ABNORMAL LOW (ref 12.0–15.0)
LYMPHS PCT: 4 % — AB (ref 12–46)
Lymphs Abs: 0.4 10*3/uL — ABNORMAL LOW (ref 0.7–4.0)
MCH: 28.8 pg (ref 26.0–34.0)
MCHC: 33.8 g/dL (ref 30.0–36.0)
MCV: 85.4 fL (ref 78.0–100.0)
MONO ABS: 0.2 10*3/uL (ref 0.1–1.0)
MONOS PCT: 2 % — AB (ref 3–12)
NEUTROS ABS: 11.6 10*3/uL — AB (ref 1.7–7.7)
Neutrophils Relative %: 94 % — ABNORMAL HIGH (ref 43–77)
Platelets: 130 10*3/uL — ABNORMAL LOW (ref 150–400)
RBC: 2.81 MIL/uL — ABNORMAL LOW (ref 3.87–5.11)
RDW: 13.1 % (ref 11.5–15.5)
WBC: 12.2 10*3/uL — ABNORMAL HIGH (ref 4.0–10.5)

## 2014-12-11 LAB — HEPARIN INDUCED PLATELET AB (HIT ANTIBODY): Heparin Induced Plt Ab: 0.232 OD (ref 0.000–0.400)

## 2014-12-11 LAB — SAVE SMEAR

## 2014-12-11 LAB — TECHNOLOGIST SMEAR REVIEW

## 2014-12-11 MED ORDER — AMLODIPINE BESYLATE 10 MG PO TABS
10.0000 mg | ORAL_TABLET | Freq: Every day | ORAL | Status: DC
  Administered 2014-12-11 – 2014-12-21 (×9): 10 mg via ORAL
  Filled 2014-12-11 (×10): qty 1

## 2014-12-11 MED ORDER — GELATIN ABSORBABLE 12-7 MM EX MISC
1.0000 | Freq: Once | CUTANEOUS | Status: AC
  Administered 2014-12-11: 1 via TOPICAL
  Filled 2014-12-11: qty 1

## 2014-12-11 MED ORDER — BOOST / RESOURCE BREEZE PO LIQD
1.0000 | Freq: Two times a day (BID) | ORAL | Status: DC
  Administered 2014-12-11 – 2014-12-13 (×3): 1 via ORAL

## 2014-12-11 MED ORDER — GELATIN ABSORBABLE 12-7 MM EX MISC
1.0000 | Freq: Once | CUTANEOUS | Status: AC
  Administered 2014-12-12: 1 via TOPICAL
  Filled 2014-12-11: qty 1

## 2014-12-11 NOTE — Progress Notes (Signed)
Initial Nutrition Assessment  DOCUMENTATION CODES:   Morbid obesity  INTERVENTION:  Discontinue Nepro Shake.  Provide Boost Breeze po BID, each supplement provides 250 kcal and 9 grams of protein.  Provide nourishment snacks. (ordered).  Encourage adequate PO intake.   NUTRITION DIAGNOSIS:   Increased nutrient needs related to chronic illness as evidenced by estimated needs.  GOAL:   Patient will meet greater than or equal to 90% of their needs  MONITOR:   PO intake, Supplement acceptance, Weight trends, Labs, I & O's  REASON FOR ASSESSMENT:   Malnutrition Screening Tool    ASSESSMENT:   39 y/o female with PMH of SVT s/p ablation, anemia of chronic disease, HTN, SLE with recently diagnosed lupus nephritis who p/w dysuria * 2 days. Patient states that she recently underwent a renal biopsy on 11/22/14 and was diagnosed with lupus nephritis. First HD stated 9/9.  Pt reports having a decreased appetite which has been ongoing over the past 2 months. PTA pt reports however still consuming at least 2 meals a day. Weight has been stable. Current meal completion has been varied from 25-70%. Pt currently has Nepro Shake ordered however does not like them and has been refusing them. Pt is agreeable to Boost Breeze instead. RD to order. Pt additionally requested nourishment snacks. RD will order.  Pt with no observed significant fat or muscle mass loss.   Labs: Phosphorous elevated (9.6). Potassium WNL.   Diet Order:  Diet renal with fluid restriction Fluid restriction:: 1200 mL Fluid; Room service appropriate?: Yes; Fluid consistency:: Thin  Skin:   (+1 LE edema)  Last BM:  9/12  Height:   Ht Readings from Last 1 Encounters:  12/03/14 5\' 3"  (1.6 m)    Weight:   Wt Readings from Last 1 Encounters:  12/11/14 240 lb 4.8 oz (109 kg)    Ideal Body Weight:  52.27 kg  BMI:  Body mass index is 42.58 kg/(m^2).  Estimated Nutritional Needs:   Kcal:  6160-7371  Protein:   110-125 grams  Fluid:  1.2 L/day  EDUCATION NEEDS:   No education needs identified at this time  Corrin Parker, MS, RD, LDN Pager # [phone] After hours/ weekend pager # [phone]

## 2014-12-11 NOTE — Progress Notes (Signed)
Pt complained of inability to urinate and some burning.  RN performed straight cath.  200cc of urine withdrawn.  Pt states feeling better.  Oswald Hillock, RN

## 2014-12-11 NOTE — Consult Note (Signed)
[facility] [place]  Reason for consult: permanent dialysis access and tunneled dialysis catheter placement  Consulting physician: Dr. Posey Pronto (nephrology)   History of Present Illness  [name] is a 39 y.o. ([birth year]) female who presents for evaluation for permanent access.  The patient is right hand dominant.  The patient has not had previous access procedures.  She has had an attempted left IJ tunneled dialysis catheter. She is currently dialyzing via a right femoral tunneled dialysis catheter. She has had episodes of bleeding from her femoral catheter site and PICC line site. She is being worked up for HIT. The patient does not have a pacemaker.   The patient presented to the [facility] ED on 11/02/14 with complains of hematuria and dysuria. She recently underwent kidney biopsy on 11/22/14 and was diagnosed with lupus nephritis. She was diagnosed with a UTI and found to have worsening AKI. She has a past medical history of SVT s/p ablation in 2014, hypertension managed on norvasc and anemia of chronic disease. She currently denies any chest pain, palpitations or shortness of breath. She does report anorexia and nausea.   Past Medical History  Diagnosis Date  . Hypertension   . Cardiac arrhythmia   . SVT (supraventricular tachycardia)     "today, last week, 2 wk ago; maybe 1 month ago, etc; started w/in last 3-4 yrs"(07/20/2012)  . Fainting     "~ 1 month ago; probably related to SVT" (07/20/2012)  . Heart murmur     "small" (07/20/2012)  . Shortness of breath     "related to SVT episodes" (07/20/2012)  . Anemia     "today" (07/20/2012)  . Chest pain at rest     "related to SVT" (07/20/2012)  . Sleep apnea     "don't wear mask" (07/20/2012)  . Daily headache   . Anxiety   . Lupus (systemic lupus erythematosus)   . Renal disorder   . Chronic renal failure, stage 4 (severe)   . GERD (gastroesophageal reflux disease)   . Fibromyalgia   . Irritable bowel  syndrome (IBS)     Past Surgical History  Procedure Laterality Date  . Appendectomy  2011  . Tubal ligation  2010  . Cesarean section  2001; 2010  . Cervical biopsy  2013  . Ablation      07/21/12 Dr. Cristopher Peru     Social History   Social History  . Marital Status: Single    Spouse Name: N/A  . Number of Children: N/A  . Years of Education: N/A   Occupational History  . Not on file.   Social History Main Topics  . Smoking status: Never Smoker   . Smokeless tobacco: Never Used  . Alcohol Use: No     Comment: 07/20/2012 "might have a beer once/yr"  . Drug Use: No  . Sexual Activity: Not Currently    Birth Control/ Protection: None   Other Topics Concern  . Not on file   Social History Narrative    Family History  Problem Relation Age of Onset  . Cancer Mother     breast and ovarian  . BRCA 1/2 Sister     No current facility-administered medications on file prior to encounter.   Current Outpatient Prescriptions on File Prior to Encounter  Medication Sig Dispense Refill  . acetaminophen (TYLENOL) 500 MG tablet Take 1,000 mg by mouth every 6 (six) hours as needed (pain).     Marland Kitchen alendronate (FOSAMAX) 70 MG tablet  Take 70 mg by mouth once a week. Take on Wednesdays with a full glass of water on an empty stomach.    Marland Kitchen amLODipine (NORVASC) 10 MG tablet Take 1 tablet (10 mg total) by mouth daily. (Patient taking differently: Take 10 mg by mouth 2 (two) times daily. ) 30 tablet 2  . Calcium Carbonate Antacid (TUMS PO) Take 1-2 tablets by mouth daily as needed (heartburn).    . cyclobenzaprine (FLEXERIL) 10 MG tablet Take 10 mg by mouth at bedtime as needed for muscle spasms.     . famotidine (PEPCID) 40 MG tablet Take 1 tablet (40 mg total) by mouth daily. 20 tablet 0  . mycophenolate (CELLCEPT) 500 MG tablet Take 1,500 mg by mouth 2 (two) times daily.     . ondansetron (ZOFRAN ODT) 4 MG disintegrating tablet 26m ODT q4 hours prn nausea/vomit 10 tablet 0  .  oxyCODONE-acetaminophen (PERCOCET) 5-325 MG per tablet Take 1 tablet by mouth every 4 (four) hours as needed for severe pain. 20 tablet 0  . predniSONE (DELTASONE) 20 MG tablet Take 20 mg by mouth 3 (three) times daily.     . simvastatin (ZOCOR) 40 MG tablet Take 40 mg by mouth daily.      Allergies  Allergen Reactions  . Food Swelling    Red peppers    REVIEW OF SYSTEMS:  (Positives checked otherwise negative)  CARDIOVASCULAR:  []  chest pain, []  chest pressure, []  palpitations, []  shortness of breath when laying flat, []  shortness of breath with exertion,  []  pain in feet when walking, []  pain in feet when laying flat, []  history of blood clot in veins (DVT), []  history of phlebitis, []  swelling in legs, []  varicose veins  PULMONARY:  []  productive cough, []  asthma, []  wheezing  NEUROLOGIC:  []  weakness in arms or legs, []  numbness in arms or legs, []  difficulty speaking or slurred speech, []  temporary loss of vision in one eye, []  dizziness  HEMATOLOGIC:  [x bleeding problems, []  problems with blood clotting too easily  MUSCULOSKEL:  []  joint pain, []  joint swelling  GASTROINTEST:  []  vomiting blood, []  blood in stool     GENITOURINARY:  []  burning with urination, [x]  blood in urine  PSYCHIATRIC:  []  history of major depression  INTEGUMENTARY:  []  rashes, []  ulcers  CONSTITUTIONAL:  []  fever, []  chills  Physical Examination  Filed Vitals:   12/10/14 2053 12/11/14 0607 12/11/14 0609 12/11/14 0908  BP: 146/80  148/77 152/75  Pulse: 105  98 96  Temp: 98.2 F (36.8 C)  98.3 F (36.8 C)   TempSrc: Oral  Oral   Resp: 16  18   Height:      Weight:  240 lb 4.8 oz (109 kg)    SpO2: 99%  100%    Body mass index is 42.58 kg/(m^2).  General: A&O x 3, WDWN morbidly obese female in NAD  Head: /AT  Neck: Supple  Pulmonary: Sym exp, good air movt, CTAB, no rales, rhonchi, & wheezing  Cardiac: RRR, Nl S1, S2, no Murmurs, rubs or gallops  Vascular: 2+ radial, ulnar,  brachial and dorsalis pedis pulses bilaterally. Right femoral HD catheter dressing clean  Musculoskeletal: M/S 5/5 throughout, Extremities without ischemic changes.   Neurologic: CN 2-12 grossly intact, Pain and light touch intact in extremities, Motor exam as listed above  Psychiatric: Judgment intact, Mood & affect appropriate for pt's clinical situation  Dermatologic: See M/S exam for extremity exam, no rashes otherwise noted  Non-Invasive  Vascular Imaging  Vein Mapping  pending  Medical Decision Making  [name] is a 39 y.o. female who presents with new ESRD secondary to progressive renal insufficiency from lupus nephriits requiring hemodialysis. The patient is right handed.   Await results of vein mapping to determine access placement.   Will need to wait for resolution of oozing from right femoral HD cath prior to surgery.   Will reevaluate in OR for placement of IJ tunneled dialysis catheter.   I had an extensive discussion with this patient in regards to the nature of access surgery, including risk, benefits, and alternatives.    The patient is aware that the risks of access surgery include but are not limited to: bleeding, infection, steal syndrome, nerve damage, , failure of access to mature, and possible need for additional access procedures in the future.  Dr. Trula Slade to see patient.   Eileen Jock, PA-C [facility] Office: [phone] Pager: [phone]  12/11/2014, 1:04 PM   I agree with the above.  I have seen and evaluated the patient.  She is currently in dialysis.  Her vein mapping has yet to be completed.  I have some concerns regarding placing her fistula.  She has had cardiac ablations in the past and interventional radiology could not place a tunneled catheter from the left side of the neck.  She also has a PICC line in place on the right side.  I think before proceeding with an upper arm access, she needs to have a  venogram to evaluate her central venous system for stenosis versus occlusion.  After speaking with Dr. Florene Glen who knows the patient very well, her creatinine was in the high 1 range earlier in the summer.  She deteriorated dramatically with a lupus flare.  Therefore I am a little concerned about giving her contrast.  However it seems the most prudent thing to do is to perform a venogram on one side of the body based on the results of her vein mapping.  I will plan on doing a venogram tomorrow.  The laterality will be determined based on the results of her vein mapping which will hopefully be done later today.  Annamarie Major

## 2014-12-11 NOTE — Progress Notes (Signed)
Pt's femoral catheter still oozing blood.  Dressing reinforced twice.  IV therapy notified. Doctor notified and ordered gel foam. Bleeding was controlled.  RN will continue to monitor. Oswald Hillock, RN

## 2014-12-11 NOTE — Progress Notes (Signed)
Patient ID: [name], female   DOB: 25-Aug-1975, 39 y.o.   MRN: [id number]  [facility] Progress Note   Assessment/ Plan:   1. 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 Status post 3 initial dialysis treatments via right femoral tunneled dialysis catheter. I have consulted vascular surgery for evaluation/placement of permanent dialysis access as well as reevaluation for possible IJ tunneled dialysis catheter placement rather than femoral-It is likely that this might be deferred until her abnormal bleeding times improve. Next hemodialysis tomorrow-maintain Monday/Wednesday/Friday schedule. 2. Hypertension: Blood pressures currently better-anticipate improve further with ultrafiltration and hemodialysis 3. Anemia - improved with ESA/PRBCs and slower rate of bleeding from various sites 4. Klebsiella UTI- on rocephin per primary team- symptoms improved and without markers of sepsis 5. 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, PTH levels pending  Subjective:   Reports that bleeding around her dialysis catheter has improved now to a slow ooze    Objective:   BP 152/75 mmHg  Pulse 96  Temp(Src) 98.3 F (36.8 C) (Oral)  Resp 18  Ht 5\' 3"  (1.6 m)  Wt 109 kg (240 lb 4.8 oz)  BMI 42.58 kg/m2  SpO2 100%  LMP 11/17/2014 (Approximate)  Intake/Output Summary (Last 24 hours) at 12/11/14 1148 Last data filed at 12/11/14 0900  Gross per 24 hour  Intake    500 ml  Output   3218 ml  Net  -2718 ml   Weight change: 0.2 kg (7.1 oz)  Physical Exam: WUJ:WJXBJYNWGNF resting in bed  AOZ:HYQMV regular tachycardia, s1 and s2 with ESM Resp:CTA bilaterally, no rales/rhonchi HQI:ONGE,XBMWU,  NT XLK:GMWNU-2+ LE edema. Right femoral tunneled dialysis catheter  Imaging: No results found.  Labs: BMET  Recent Labs Lab 12/05/14 0442 12/05/14 1650 12/06/14 0515 12/07/14 1707 12/08/14 0525 12/08/14 1830 12/10/14 0621 12/11/14 1010  NA 135 136 134* 137 138 135 135 135  K 6.0* 4.6 4.6 4.8 4.5 4.6 4.6 4.5  CL 107 109 105 109 106 102 101 99*  CO2 14* 12* 13* 12* 18* 16* 21* 24  GLUCOSE 157* 131* 155* 122* 126* 137* 150* 143*  BUN 148* 153* 154* 170* 129* 130* 106* 65*  CREATININE 8.04* 8.22* 8.08* 8.34* 6.38* 6.63* 5.78* 4.82*  CALCIUM 8.1* 8.1* 8.0* 8.3* 8.1* 8.3* 8.4* 8.6*  PHOS 10.1*  --  11.0* 12.3* 10.9* 11.9* 9.6*  --    CBC  Recent Labs Lab 12/08/14 0525 12/08/14 1830 12/09/14 0515 12/10/14 0622 12/11/14 1010  WBC 11.1* 12.3* 8.8 13.2* 12.2*  NEUTROABS 10.6*  --  8.3* 12.4* 11.6*  HGB 7.5* 7.1* 8.9* 9.1* 8.1*  HCT 21.5* 20.6* 25.3* 25.4* 24.0*  MCV 82.1 82.4 83.0 83.8 85.4  PLT 128* 134* 79* 114* 130*    Medications:    . sodium chloride   Intravenous Once  . amLODipine  10 mg Oral Daily  . atorvastatin  20 mg Oral q1800  . calcium acetate  1,334 mg Oral TID WC  . cyclobenzaprine  10 mg Oral QHS  . darbepoetin (ARANESP) injection - DIALYSIS  100 mcg Intravenous Q Sat-HD  . famotidine  40 mg Oral Daily  . feeding supplement (NEPRO CARB STEADY)  237 mL Oral BID BM  . hydrALAZINE  50 mg Oral 3 times per day  .  metoprolol succinate  50 mg Oral Daily  . mycophenolate  1,500 mg Oral BID  . predniSONE  20 mg Oral TID   Elmarie Shiley, MD 12/11/2014, 11:48 AM '

## 2014-12-11 NOTE — Progress Notes (Signed)
Subjective: Bleeding from R fem HD cath site overnight. Stopped since about 4am. Feels tired but otherwise all right. Eating meals without nausea. Next HD tmrw. Objective: Vital signs in last 24 hours: Filed Vitals:   12/11/14 0607 12/11/14 0609 12/11/14 0908 12/11/14 1502  BP:  148/77 152/75 150/76  Pulse:  98 96 100  Temp:  98.3 F (36.8 C)  98.3 F (36.8 C)  TempSrc:  Oral  Oral  Resp:  18  20  Height:      Weight: 109 kg (240 lb 4.8 oz)     SpO2:  100%  99%   Weight change: 0.2 kg (7.1 oz)  Intake/Output Summary (Last 24 hours) at 12/11/14 1634 Last data filed at 12/11/14 1539  Gross per 24 hour  Intake    618 ml  Output    200 ml  Net    418 ml   GENERAL- alert, orientedx3, NAD HEENT- Atraumatic, oral mucosa appears moist CARDIAC- RRR, no murmurs, rubs or gallops. RESP- CTAB, no wheezes or crackles. ABDOMEN- No left sided abdominal TTP on exam, but flank and back remain TTP, no guarding or rebound, normoactive bowel sounds EXTREMITIES- No pedal edema SKIN- Warm, dry, Wound dressings are all clean dry and intact at [facility] PICC, RUE puncture site, RLE HD catheter [facility]- Appropriate, calmed and does not seem overwhelmed visiting today  Lab Results: Basic Metabolic Panel:  Recent Labs Lab 12/08/14 1830 12/10/14 0621 12/11/14 1010  NA 135 135 135  K 4.6 4.6 4.5  CL 102 101 99*  CO2 16* 21* 24  GLUCOSE 137* 150* 143*  BUN 130* 106* 65*  CREATININE 6.63* 5.78* 4.82*  CALCIUM 8.3* 8.4* 8.6*  PHOS 11.9* 9.6*  --    Liver Function Tests:  Recent Labs Lab 12/07/14 1707  12/08/14 1830 12/10/14 0621  ALT 26  --   --   --   ALBUMIN 3.1*  < > 2.9* 3.0*  < > = values in this interval not displayed. No results for input(s): LIPASE, AMYLASE in the last 168 hours. No results for input(s): AMMONIA in the last 168 hours. CBC:  Recent Labs Lab 12/10/14 0622 12/11/14 1010  WBC 13.2* 12.2*  NEUTROABS 12.4* 11.6*  HGB 9.1* 8.1*  HCT 25.4* 24.0*  MCV 83.8 85.4    PLT 114* 130*   Cardiac Enzymes: No results for input(s): CKTOTAL, CKMB, CKMBINDEX, TROPONINI in the last 168 hours. BNP: No results for input(s): PROBNP in the last 168 hours. D-Dimer: No results for input(s): DDIMER in the last 168 hours. CBG: No results for input(s): GLUCAP in the last 168 hours. Hemoglobin A1C: No results for input(s): HGBA1C in the last 168 hours. Fasting Lipid Panel: No results for input(s): CHOL, HDL, LDLCALC, TRIG, CHOLHDL, LDLDIRECT in the last 168 hours. Thyroid Function Tests: No results for input(s): TSH, T4TOTAL, FREET4, T3FREE, THYROIDAB in the last 168 hours. Coagulation:  Recent Labs Lab 12/06/14 1440  LABPROT 13.9  INR 1.05   Anemia Panel: No results for input(s): VITAMINB12, FOLATE, FERRITIN, TIBC, IRON, RETICCTPCT in the last 168 hours. Urine Drug Screen: Drugs of Abuse     Component Value Date/Time   LABOPIA NONE DETECTED 07/21/2012 0215   COCAINSCRNUR NONE DETECTED 07/21/2012 0215   LABBENZ NONE DETECTED 07/21/2012 0215   AMPHETMU NONE DETECTED 07/21/2012 0215   THCU NONE DETECTED 07/21/2012 0215   LABBARB NONE DETECTED 07/21/2012 0215    Alcohol Level: No results for input(s): ETH in the last 168 hours. Urinalysis: No  results for input(s): COLORURINE, LABSPEC, PHURINE, GLUCOSEU, HGBUR, BILIRUBINUR, KETONESUR, PROTEINUR, UROBILINOGEN, NITRITE, LEUKOCYTESUR in the last 168 hours.  Invalid input(s): APPERANCEUR   Micro Results: Recent Results (from the past 240 hour(s))  Urine culture     Status: None   Collection Time: 12/03/14  1:44 PM  Result Value Ref Range Status   Specimen Description URINE, CLEAN CATCH  Final   Special Requests Normal  Final   Culture >=100,000 COLONIES/mL KLEBSIELLA PNEUMONIAE  Final   Report Status 12/06/2014 FINAL  Final   Organism ID, Bacteria KLEBSIELLA PNEUMONIAE  Final      Susceptibility   Klebsiella pneumoniae - MIC*    AMPICILLIN 4 RESISTANT Resistant     CEFAZOLIN <=4 SENSITIVE Sensitive      CEFTRIAXONE <=1 SENSITIVE Sensitive     CIPROFLOXACIN <=0.25 SENSITIVE Sensitive     GENTAMICIN <=1 SENSITIVE Sensitive     IMIPENEM <=0.25 SENSITIVE Sensitive     NITROFURANTOIN 64 INTERMEDIATE Intermediate     TRIMETH/SULFA <=20 SENSITIVE Sensitive     AMPICILLIN/SULBACTAM 4 SENSITIVE Sensitive     PIP/TAZO 8 SENSITIVE Sensitive     * >=100,000 COLONIES/mL KLEBSIELLA PNEUMONIAE   Studies/Results: No results found. Medications: I have reviewed the patient's current medications. Scheduled Meds: . sodium chloride   Intravenous Once  . amLODipine  10 mg Oral Daily  . atorvastatin  20 mg Oral q1800  . calcium acetate  1,334 mg Oral TID WC  . cyclobenzaprine  10 mg Oral QHS  . darbepoetin (ARANESP) injection - DIALYSIS  100 mcg Intravenous Q Sat-HD  . famotidine  40 mg Oral Daily  . feeding supplement  1 Container Oral BID BM  . hydrALAZINE  50 mg Oral 3 times per day  . metoprolol succinate  50 mg Oral Daily  . mycophenolate  1,500 mg Oral BID  . 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 Needs new access placed before outpatient HD will be arranged. BUN down to 65. - continue HD per nephro. MWF schedule most likely - Last day cephalexin 250mg  BID, renally dosed for 4 days (12/10/14) for Klebsiella pneumoniae on urine Cx - Continue prednisone 20mg  PO TID - Continue Cellcept 1500mg  PO BID - Continue zofran 4mg  ODT q6hrs PRN - Continue PhosLo 1334mg  TIDWM - Repeat AM renal function panel, CBC - Nephrology following pt, recs appreciated  Thrombocytopenia and qualitative platelet dysfunction  Platelets nadir to 79 2 days ago. Also likely dysfunctional as BUN was 170. Received 2 doses DDAVP and 3 rounds of dialysis to date. At this point pressure dressings and  transfusion as needed are probably best plan. 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 AOCD baseline 10.9 on admission. Bleeding now better controlled no oozing since last night. Hgb 7.1 on 9/10, now 8.1. - Daily CBC only - Bed rest activity level - Continue transfuse as needed  Hyperkalemia: resolved with HD - Cardiac monitoring - Continue sodium bicarbonate 650mg  PO BID  HTN: SBPs resistant to CCB and BB therapy. BP stayed high with HD so far. - Continue Amlodipine 10mg  PO BID - Continue Metoprolol succinate 50mg  PO daily - Continue Hydralazine to 50mg  TID  Back and flank pain: Pain present since renal biopsy. - Percocet  5-325mg  q6hrs PRN - Flexeril 10mg  qHS PRN  SVT: s/p ablation 2014, has some recurrent palpitations in recent months, currently holter monitor on board. She is persistently tachycardic during this hospital course, without symptomatic complaint. HLD: Atorvastatin 20mg  PO  FEN: Renal diet, fluid restricted DVT ppx: [place] heparin FULL CODE  Dispo: Disposition deferred until improvement in clinical condition. Patient starting on hemodialysis and will require several days before stable for discharge.  The patient does have a current PCP Lin Landsman, MD) and does not need an [facility] hospital follow-up appointment after discharge.  The patient does not have transportation limitations that hinder transportation to clinic appointments.   LOS: 8 days   Collier Salina, MD 12/11/2014, 4:34 PM

## 2014-12-12 ENCOUNTER — Encounter (HOSPITAL_COMMUNITY): Payer: Self-pay

## 2014-12-12 DIAGNOSIS — N186 End stage renal disease: Secondary | ICD-10-CM | POA: Diagnosis not present

## 2014-12-12 DIAGNOSIS — Z992 Dependence on renal dialysis: Secondary | ICD-10-CM

## 2014-12-12 LAB — RENAL FUNCTION PANEL
ANION GAP: 15 (ref 5–15)
Albumin: 3 g/dL — ABNORMAL LOW (ref 3.5–5.0)
BUN: 88 mg/dL — ABNORMAL HIGH (ref 6–20)
CALCIUM: 8.7 mg/dL — AB (ref 8.9–10.3)
CO2: 21 mmol/L — AB (ref 22–32)
CREATININE: 5.98 mg/dL — AB (ref 0.44–1.00)
Chloride: 96 mmol/L — ABNORMAL LOW (ref 101–111)
GFR calc Af Amer: 9 mL/min — ABNORMAL LOW (ref >60)
GFR calc non Af Amer: 8 mL/min — ABNORMAL LOW (ref >60)
GLUCOSE: 133 mg/dL — AB (ref 65–99)
Phosphorus: 9.4 mg/dL — ABNORMAL HIGH (ref 2.5–4.6)
Potassium: 5 mmol/L (ref 3.5–5.1)
SODIUM: 132 mmol/L — AB (ref 135–145)

## 2014-12-12 LAB — CBC
HCT: 22.7 % — ABNORMAL LOW (ref 36.0–46.0)
Hemoglobin: 7.9 g/dL — ABNORMAL LOW (ref 12.0–15.0)
MCH: 29.8 pg (ref 26.0–34.0)
MCHC: 34.8 g/dL (ref 30.0–36.0)
MCV: 85.7 fL (ref 78.0–100.0)
PLATELETS: 175 10*3/uL (ref 150–400)
RBC: 2.65 MIL/uL — ABNORMAL LOW (ref 3.87–5.11)
RDW: 13.2 % (ref 11.5–15.5)
WBC: 16 10*3/uL — ABNORMAL HIGH (ref 4.0–10.5)

## 2014-12-12 LAB — PARATHYROID HORMONE, INTACT (NO CA): PTH: 71 pg/mL — ABNORMAL HIGH (ref 15–65)

## 2014-12-12 MED ORDER — SODIUM CHLORIDE 0.9 % IV SOLN: 100.0000 mL | INTRAVENOUS | Status: DC | PRN

## 2014-12-12 MED ORDER — PENTAFLUOROPROP-TETRAFLUOROETH EX AERO: 1.0000 "application " | INHALATION_SPRAY | CUTANEOUS | Status: DC | PRN

## 2014-12-12 MED ORDER — ALTEPLASE 2 MG IJ SOLR
2.0000 mg | Freq: Once | INTRAMUSCULAR | Status: DC | PRN
  Filled 2014-12-12: qty 2

## 2014-12-12 MED ORDER — HEPARIN SODIUM (PORCINE) 1000 UNIT/ML DIALYSIS: 1000.0000 [IU] | INTRAMUSCULAR | Status: DC | PRN

## 2014-12-12 MED ORDER — NYSTATIN 100000 UNIT/GM EX POWD
Freq: Three times a day (TID) | CUTANEOUS | Status: DC
  Administered 2014-12-12 – 2014-12-23 (×22): via TOPICAL
  Filled 2014-12-12: qty 15

## 2014-12-12 MED ORDER — GELATIN ABSORBABLE 12-7 MM EX MISC
1.0000 | Freq: Once | CUTANEOUS | Status: DC | PRN
  Filled 2014-12-12 (×3): qty 1

## 2014-12-12 MED ORDER — LIDOCAINE-PRILOCAINE 2.5-2.5 % EX CREA: 1.0000 "application " | TOPICAL_CREAM | CUTANEOUS | Status: DC | PRN

## 2014-12-12 MED ORDER — HEPARIN SODIUM (PORCINE) 1000 UNIT/ML DIALYSIS: 20.0000 [IU]/kg | INTRAMUSCULAR | Status: DC | PRN

## 2014-12-12 MED ORDER — LIDOCAINE HCL (PF) 1 % IJ SOLN: 5.0000 mL | INTRAMUSCULAR | Status: DC | PRN

## 2014-12-12 NOTE — Procedures (Signed)
Patient seen on Hemodialysis. QB 340, UF goal 3.5L Treatment adjusted as needed.  Elmarie Shiley MD [facility]. Office # [phone] Pager # [phone] 10:46 AM

## 2014-12-12 NOTE — Progress Notes (Signed)
Patient ID: [name], female   DOB: 1975/04/30, 39 y.o.   MRN: [id number]  [facility] Progress Note   Assessment/ Plan:   1. 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 Continue MWF HD via right femoral tunneled dialysis catheter. Appreciate input from Dr. Trula Slade of vascular surgery who plans to do venogram tomorrow (RIJ PICC and prior RF ablations). 2. Hypertension: Blood pressures currently better-anticipate improve further with ultrafiltration and hemodialysis 3. Anemia - improved with ESA/PRBCs and slower rate of bleeding from various sites 4. Klebsiella UTI- on rocephin per primary team- symptoms improved and without markers of sepsis 5. 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, PTH levels pending  Subjective:   Reports that she is feeling better and denies any complaints at this time    Objective:   BP 133/85 mmHg  Pulse 111  Temp(Src) 97.5 F (36.4 C) (Oral)  Resp 21  Ht 5\' 3"  (1.6 m)  Wt 108.7 kg (239 lb 10.2 oz)  BMI 42.46 kg/m2  SpO2 100%  LMP 11/17/2014 (Approximate)  Intake/Output Summary (Last 24 hours) at 12/12/14 1047 Last data filed at 12/12/14 0533  Gross per 24 hour  Intake    258 ml  Output    250 ml  Net      8 ml   Weight change: -1.3 kg (-2 lb 13.9 oz)  Physical Exam: WUJ:WJXBJYNWGNF at dialysis  AOZ:HYQMV regular tachycardia, s1 and s2 with ESM Resp:CTA bilaterally, no rales/rhonchi HQI:ONGE,XBMWU, NT XLK:GMWNU LE edema. Right femoral tunneled dialysis catheter  Imaging: No results found.  Labs: BMET  Recent Labs Lab 12/06/14 0515 12/07/14 1707 12/08/14 0525 12/08/14 1830 12/10/14 0621 12/11/14 1010 12/12/14 0900  NA 134* 137  138 135 135 135 132*  K 4.6 4.8 4.5 4.6 4.6 4.5 5.0  CL 105 109 106 102 101 99* 96*  CO2 13* 12* 18* 16* 21* 24 21*  GLUCOSE 155* 122* 126* 137* 150* 143* 133*  BUN 154* 170* 129* 130* 106* 65* 88*  CREATININE 8.08* 8.34* 6.38* 6.63* 5.78* 4.82* 5.98*  CALCIUM 8.0* 8.3* 8.1* 8.3* 8.4* 8.6* 8.7*  PHOS 11.0* 12.3* 10.9* 11.9* 9.6*  --  9.4*   CBC  Recent Labs Lab 12/08/14 0525  12/09/14 0515 12/10/14 0622 12/11/14 1010 12/12/14 0900  WBC 11.1*  < > 8.8 13.2* 12.2* 16.0*  NEUTROABS 10.6*  --  8.3* 12.4* 11.6*  --   HGB 7.5*  < > 8.9* 9.1* 8.1* 7.9*  HCT 21.5*  < > 25.3* 25.4* 24.0* 22.7*  MCV 82.1  < > 83.0 83.8 85.4 85.7  PLT 128*  < > 79* 114* 130* 175  < > = values in this interval not displayed.  Medications:    . sodium chloride   Intravenous Once  . amLODipine  10 mg Oral Daily  . atorvastatin  20 mg Oral q1800  . calcium acetate  1,334 mg Oral TID WC  . cyclobenzaprine  10 mg Oral QHS  . darbepoetin (ARANESP) injection - DIALYSIS  100 mcg Intravenous Q Sat-HD  . famotidine  40 mg Oral Daily  . feeding supplement  1 Container Oral BID BM  . hydrALAZINE  50 mg Oral 3 times per day  . metoprolol succinate  50 mg Oral Daily  . mycophenolate  1,500 mg Oral BID  . nystatin   Topical TID  . predniSONE  20 mg Oral TID   Elmarie Shiley, MD 12/12/2014, 10:47 AM

## 2014-12-12 NOTE — Progress Notes (Signed)
Subjective: Not requiring any extra dressing changes for blood loss at R femoral HD catheter site. Slow oozing, Hgb stable. Tolerating dialysis with mild fatigue. Discussed plans for IJ access placement and graft by vascular surgery. She will be undergoing vein mapping most likely later today.  Objective: Vital signs in last 24 hours: Filed Vitals:   12/12/14 1200 12/12/14 1230 12/12/14 1251 12/12/14 1327  BP: 139/89 138/88 141/95 153/80  Pulse: 113 111 112 104  Temp:   97.5 F (36.4 C) 98.1 F (36.7 C)  TempSrc:   Oral Oral  Resp:  19 20 16   Height:      Weight:   106 kg (233 lb 11 oz)   SpO2:  100% 100% 100%   Weight change: -1.3 kg (-2 lb 13.9 oz)  Intake/Output Summary (Last 24 hours) at 12/12/14 1402 Last data filed at 12/12/14 1251  Gross per 24 hour  Intake    258 ml  Output   2937 ml  Net  -2679 ml   GENERAL- alert, orientedx3, NAD CARDIAC- RRR, no murmurs, rubs or gallops. RESP- CTAB, no wheezes or crackles. ABDOMEN- No left sided abdominal TTP on exam, yeast rash at waistline under abdominal pannus, with mild tenderness and pruritis EXTREMITIES- No pedal edema SKIN- Warm, dry, Wound dressings are all clean dry and intact at [place], RUE puncture site, RLE HD catheter [facility]- Appropriate, pleasant  Lab Results: Basic Metabolic Panel:  Recent Labs Lab 12/10/14 0621 12/11/14 1010 12/12/14 0900  NA 135 135 132*  K 4.6 4.5 5.0  CL 101 99* 96*  CO2 21* 24 21*  GLUCOSE 150* 143* 133*  BUN 106* 65* 88*  CREATININE 5.78* 4.82* 5.98*  CALCIUM 8.4* 8.6* 8.7*  PHOS 9.6*  --  9.4*   Liver Function Tests:  Recent Labs Lab 12/07/14 1707  12/10/14 0621 12/12/14 0900  ALT 26  --   --   --   ALBUMIN 3.1*  < > 3.0* 3.0*  < > = values in this interval not displayed. No results for input(s): LIPASE, AMYLASE in the last 168 hours. No results for input(s): AMMONIA in the last 168 hours. CBC:  Recent Labs Lab 12/10/14 0622 12/11/14 1010 12/12/14 0900  WBC  13.2* 12.2* 16.0*  NEUTROABS 12.4* 11.6*  --   HGB 9.1* 8.1* 7.9*  HCT 25.4* 24.0* 22.7*  MCV 83.8 85.4 85.7  PLT 114* 130* 175   Cardiac Enzymes: No results for input(s): CKTOTAL, CKMB, CKMBINDEX, TROPONINI in the last 168 hours. BNP: No results for input(s): PROBNP in the last 168 hours. D-Dimer: No results for input(s): DDIMER in the last 168 hours. CBG: No results for input(s): GLUCAP in the last 168 hours. Hemoglobin A1C: No results for input(s): HGBA1C in the last 168 hours. Fasting Lipid Panel: No results for input(s): CHOL, HDL, LDLCALC, TRIG, CHOLHDL, LDLDIRECT in the last 168 hours. Thyroid Function Tests: No results for input(s): TSH, T4TOTAL, FREET4, T3FREE, THYROIDAB in the last 168 hours. Coagulation:  Recent Labs Lab 12/06/14 1440  LABPROT 13.9  INR 1.05   Anemia Panel: No results for input(s): VITAMINB12, FOLATE, FERRITIN, TIBC, IRON, RETICCTPCT in the last 168 hours. Urine Drug Screen: Drugs of Abuse     Component Value Date/Time   LABOPIA NONE DETECTED 07/21/2012 0215   COCAINSCRNUR NONE DETECTED 07/21/2012 0215   LABBENZ NONE DETECTED 07/21/2012 0215   AMPHETMU NONE DETECTED 07/21/2012 0215   THCU NONE DETECTED 07/21/2012 0215   LABBARB NONE DETECTED 07/21/2012 0215  Alcohol Level: No results for input(s): ETH in the last 168 hours. Urinalysis: No results for input(s): COLORURINE, LABSPEC, PHURINE, GLUCOSEU, HGBUR, BILIRUBINUR, KETONESUR, PROTEINUR, UROBILINOGEN, NITRITE, LEUKOCYTESUR in the last 168 hours.  Invalid input(s): APPERANCEUR   Micro Results: Recent Results (from the past 240 hour(s))  Urine culture     Status: None   Collection Time: 12/03/14  1:44 PM  Result Value Ref Range Status   Specimen Description URINE, CLEAN CATCH  Final   Special Requests Normal  Final   Culture >=100,000 COLONIES/mL KLEBSIELLA PNEUMONIAE  Final   Report Status 12/06/2014 FINAL  Final   Organism ID, Bacteria KLEBSIELLA PNEUMONIAE  Final       Susceptibility   Klebsiella pneumoniae - MIC*    AMPICILLIN 4 RESISTANT Resistant     CEFAZOLIN <=4 SENSITIVE Sensitive     CEFTRIAXONE <=1 SENSITIVE Sensitive     CIPROFLOXACIN <=0.25 SENSITIVE Sensitive     GENTAMICIN <=1 SENSITIVE Sensitive     IMIPENEM <=0.25 SENSITIVE Sensitive     NITROFURANTOIN 64 INTERMEDIATE Intermediate     TRIMETH/SULFA <=20 SENSITIVE Sensitive     AMPICILLIN/SULBACTAM 4 SENSITIVE Sensitive     PIP/TAZO 8 SENSITIVE Sensitive     * >=100,000 COLONIES/mL KLEBSIELLA PNEUMONIAE   Studies/Results: No results found. Medications: I have reviewed the patient's current medications. Scheduled Meds: . sodium chloride   Intravenous Once  . amLODipine  10 mg Oral Daily  . atorvastatin  20 mg Oral q1800  . calcium acetate  1,334 mg Oral TID WC  . cyclobenzaprine  10 mg Oral QHS  . darbepoetin (ARANESP) injection - DIALYSIS  100 mcg Intravenous Q Sat-HD  . famotidine  40 mg Oral Daily  . feeding supplement  1 Container Oral BID BM  . hydrALAZINE  50 mg Oral 3 times per day  . metoprolol succinate  50 mg Oral Daily  . mycophenolate  1,500 mg Oral BID  . nystatin   Topical TID  . 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 Abx completed for UTI. WBC increased to 16. Uremic symptoms improving with decreased bleeding, better diet tolerance. - continue HD per nephro. MWF schedule - Continue prednisone 20mg  PO TID - Continue Cellcept 1500mg  PO BID - Continue zofran 4mg  ODT q6hrs PRN - Continue PhosLo 1334mg  TIDWM - Follow renal function panel, CBC - Nephrology following pt, recs appreciated  Candidal rash: Along waistline under abdominal pannus. Skin intact, mild tenderness and pruritis. 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 Bleeding decreased to slow ooze form 1 site, controlled with pressure dressing. - No heparin  Anemia: 2/2 to renal impairment + acute blood loss. AOCD baseline 10.9 on admission. Hgb stable since yesterday with no frank bleeding - Daily CBC - Bed rest activity level - Continue transfuse on HD as needed  Hyperkalemia: resolved with HD - Cardiac monitoring - Continue sodium bicarbonate 650mg  PO BID  HTN: SBPs resistant to CCB and BB therapy. BP stayed high with HD so far. - Continue Amlodipine 10mg  PO BID - Continue Metoprolol succinate 50mg  PO daily - Continue Hydralazine to 50mg  TID  Back and flank pain: Pain present since renal biopsy. - Percocet 5-325mg  q6hrs PRN - Flexeril 10mg  qHS PRN  SVT: s/p ablation 2014, has some recurrent palpitations in recent months,  currently holter monitor on board. She is persistently tachycardic during this hospital course, without symptomatic complaint. HLD: Atorvastatin 20mg  PO  FEN: Renal diet, fluid restricted DVT ppx: [place] heparin FULL CODE  Dispo: Disposition deferred until improvement in clinical condition. Patient starting on hemodialysis and will require several days before stable for discharge.  The patient does have a current PCP Lin Landsman, MD) and does not need an [facility] hospital follow-up appointment after discharge.  The patient does not have transportation limitations that hinder transportation to clinic appointments.   LOS: 9 days   Collier Salina, MD 12/12/2014, 2:02 PM

## 2014-12-13 ENCOUNTER — Inpatient Hospital Stay (HOSPITAL_COMMUNITY): Payer: Medicaid Other

## 2014-12-13 DIAGNOSIS — Z992 Dependence on renal dialysis: Secondary | ICD-10-CM

## 2014-12-13 DIAGNOSIS — B372 Candidiasis of skin and nail: Secondary | ICD-10-CM

## 2014-12-13 DIAGNOSIS — N186 End stage renal disease: Secondary | ICD-10-CM

## 2014-12-13 LAB — RENAL FUNCTION PANEL
ALBUMIN: 2.8 g/dL — AB (ref 3.5–5.0)
ANION GAP: 10 (ref 5–15)
BUN: 56 mg/dL — ABNORMAL HIGH (ref 6–20)
CALCIUM: 8.4 mg/dL — AB (ref 8.9–10.3)
CO2: 27 mmol/L (ref 22–32)
Chloride: 97 mmol/L — ABNORMAL LOW (ref 101–111)
Creatinine, Ser: 4.43 mg/dL — ABNORMAL HIGH (ref 0.44–1.00)
GFR calc non Af Amer: 12 mL/min — ABNORMAL LOW (ref >60)
GFR, EST AFRICAN AMERICAN: 14 mL/min — AB (ref >60)
Glucose, Bld: 128 mg/dL — ABNORMAL HIGH (ref 65–99)
PHOSPHORUS: 7 mg/dL — AB (ref 2.5–4.6)
Potassium: 4.5 mmol/L (ref 3.5–5.1)
SODIUM: 134 mmol/L — AB (ref 135–145)

## 2014-12-13 LAB — CBC
HEMATOCRIT: 24.8 % — AB (ref 36.0–46.0)
HEMOGLOBIN: 8.5 g/dL — AB (ref 12.0–15.0)
MCH: 29.6 pg (ref 26.0–34.0)
MCHC: 34.3 g/dL (ref 30.0–36.0)
MCV: 86.4 fL (ref 78.0–100.0)
Platelets: 146 10*3/uL — ABNORMAL LOW (ref 150–400)
RBC: 2.87 MIL/uL — AB (ref 3.87–5.11)
RDW: 12.9 % (ref 11.5–15.5)
WBC: 14.2 10*3/uL — AB (ref 4.0–10.5)

## 2014-12-13 LAB — CBC WITH DIFFERENTIAL/PLATELET
BASOS PCT: 0 %
Basophils Absolute: 0 10*3/uL (ref 0.0–0.1)
EOS ABS: 0 10*3/uL (ref 0.0–0.7)
Eosinophils Relative: 0 %
HCT: 19.9 % — ABNORMAL LOW (ref 36.0–46.0)
HEMOGLOBIN: 6.7 g/dL — AB (ref 12.0–15.0)
LYMPHS ABS: 0.1 10*3/uL — AB (ref 0.7–4.0)
Lymphocytes Relative: 1 %
MCH: 29.3 pg (ref 26.0–34.0)
MCHC: 33.7 g/dL (ref 30.0–36.0)
MCV: 86.9 fL (ref 78.0–100.0)
Monocytes Absolute: 0.3 10*3/uL (ref 0.1–1.0)
Monocytes Relative: 2 %
NEUTROS ABS: 11.9 10*3/uL — AB (ref 1.7–7.7)
NEUTROS PCT: 97 %
Platelets: 134 10*3/uL — ABNORMAL LOW (ref 150–400)
RBC: 2.29 MIL/uL — AB (ref 3.87–5.11)
RDW: 13.1 % (ref 11.5–15.5)
WBC: 12.3 10*3/uL — AB (ref 4.0–10.5)

## 2014-12-13 LAB — PREPARE RBC (CROSSMATCH)

## 2014-12-13 MED ORDER — METOPROLOL SUCCINATE ER 100 MG PO TB24
100.0000 mg | ORAL_TABLET | Freq: Every day | ORAL | Status: DC
Start: 2014-12-13 — End: 2014-12-21
  Administered 2014-12-13 – 2014-12-19 (×7): 100 mg via ORAL
  Filled 2014-12-13 (×7): qty 1

## 2014-12-13 MED ORDER — SODIUM CHLORIDE 0.9 % IV SOLN
Freq: Once | INTRAVENOUS | Status: AC
  Administered 2014-12-13: 12:00:00 via INTRAVENOUS

## 2014-12-13 MED ORDER — DARBEPOETIN ALFA 100 MCG/0.5ML IJ SOSY
100.0000 ug | PREFILLED_SYRINGE | INTRAMUSCULAR | Status: DC
  Administered 2014-12-17 – 2014-12-20 (×2): 100 ug via INTRAVENOUS
  Filled 2014-12-13: qty 0.5

## 2014-12-13 MED ORDER — SODIUM CHLORIDE 0.9 % IV SOLN
Freq: Once | INTRAVENOUS | Status: AC
  Administered 2014-12-18: 13:00:00 via INTRAVENOUS

## 2014-12-13 NOTE — Progress Notes (Signed)
Right  Upper Extremity Vein Map    Cephalic  Segment Diameter Depth Comment  1. Axilla 83mm mm   2. Mid upper arm 1.41mm mm   3. Above AC 2.14mm mm   4. In AC 2.58mm mm   5. Below AC 1.79mm mm   6. Mid forearm 1.72mm mm   7. Wrist mm mm    mm mm    mm mm    mm mm    Basilic  Segment Diameter Depth Comment  1. Axilla mm mm Thrombosis  2. Mid upper arm mm mm Thrombosis  3. Above AC mm mm Thrombosis  4. In AC mm mm Thrombosis  5. Below AC mm mm Thrombosis  6. Mid forearm mm mm Thrombosis  7. Wrist mm mm Thrombosis   mm mm    mm mm    mm mm         Left Upper Extremity Vein Map    Cephalic  Segment Diameter Depth Comment  1. Axilla 66mm mm   2. Mid upper arm 1.21mm mm   3. Above AC 39mm mm   4. In AC 2.31mm mm   5. Below AC 41mm mm   6. Mid forearm 67mm mm   7. Wrist mm mm    mm mm    mm mm    mm mm    Basilic  Segment Diameter Depth Comment  1. Axilla mm mm   2. Mid upper arm 58mm 66mm   3. Above AC 1.2mm 54mm   4. In AC 22mm 4.54mm   5. Below AC 2.21mm 34mm   6. Mid forearm 1.8mm 38mm   7. Wrist mm mm    mm mm    mm mm    mm mm

## 2014-12-13 NOTE — Progress Notes (Signed)
Paged Oncall MD. Made MD aware of patient still oozing blood from femoral hemodialysis catheter. No new orders given, will notify IV team to change dressing and continue to monitor patient.

## 2014-12-13 NOTE — Progress Notes (Signed)
Patient ID: [name], female   DOB: 1975-05-19, 39 y.o.   MRN: [id number]  [facility] Progress Note   Assessment/ Plan:   1. 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 Continue hemodialysis on a Monday/Wednesday/Friday schedule with next hemodialysis due tomorrow if she does not have any acute needs at this time. Vein mapping done earlier today shows thrombosed right basilic system but patent cephalic vein. Left basilic and cephalic systems patent and appeared to have good sized veins. Will need central venograms to rule out central vein stenosis given history of radiofrequency ablation and PICC line and right IJ. She continues to have bleeding from her right femoral dialysis catheter. 2. Hypertension: Blood pressures currently better-anticipate improve further with ultrafiltration and hemodialysis 3. Anemia - hemoglobin continues to drop-likely associated with bleeding around the femoral dialysis catheter, will transfuse 2 units at dialysis tomorrow. 4. Klebsiella UTI- on rocephin per primary team- symptoms improved and without markers of sepsis 5. 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, PTH levels pending  Subjective:   Reports that she is feeling better and had recurrence of bleeding around her right femoral dialysis catheter    Objective:   BP 149/78 mmHg  Pulse 96  Temp(Src) 98 F (36.7 C) (Oral)  Resp 20  Ht 5\' 3"  (1.6 m)  Wt 106 kg (233 lb 11 oz)  BMI 41.41 kg/m2  SpO2 100%  LMP 11/17/2014 (Approximate)  Intake/Output Summary (Last 24 hours) at 12/13/14 1152 Last data filed at 12/13/14 0900  Gross per 24 hour  Intake    522 ml  Output   2857 ml  Net  -2335 ml    Weight change: -2.2 kg (-4 lb 13.6 oz)  Physical Exam: WCB:JSEGBTDVVOH resting in bed  YWV:PXTGG regular tachycardia, s1 and s2 with ESM Resp:CTA bilaterally, no rales/rhonchi YIR:SWNI,OEVOJ, NT JKK:XFGHW LE edema. Right femoral tunneled dialysis catheter  Imaging: No results found.  Labs: BMET  Recent Labs Lab 12/07/14 1707 12/08/14 0525 12/08/14 1830 12/10/14 0621 12/11/14 1010 12/12/14 0900 12/13/14 0506  NA 137 138 135 135 135 132* 134*  K 4.8 4.5 4.6 4.6 4.5 5.0 4.5  CL 109 106 102 101 99* 96* 97*  CO2 12* 18* 16* 21* 24 21* 27  GLUCOSE 122* 126* 137* 150* 143* 133* 128*  BUN 170* 129* 130* 106* 65* 88* 56*  CREATININE 8.34* 6.38* 6.63* 5.78* 4.82* 5.98* 4.43*  CALCIUM 8.3* 8.1* 8.3* 8.4* 8.6* 8.7* 8.4*  PHOS 12.3* 10.9* 11.9* 9.6*  --  9.4* 7.0*   CBC  Recent Labs Lab 12/09/14 0515 12/10/14 0622 12/11/14 1010 12/12/14 0900 12/13/14 0506  WBC 8.8 13.2* 12.2* 16.0* 12.3*  NEUTROABS 8.3* 12.4* 11.6*  --  11.9*  HGB 8.9* 9.1* 8.1* 7.9* 6.7*  HCT 25.3* 25.4* 24.0* 22.7* 19.9*  MCV 83.0 83.8 85.4 85.7 86.9  PLT 79* 114* 130* 175 134*    Medications:    . sodium chloride   Intravenous Once  . sodium chloride   Intravenous Once  . amLODipine  10 mg Oral Daily  . atorvastatin  20 mg Oral q1800  . calcium acetate  1,334 mg Oral TID WC  . cyclobenzaprine  10 mg Oral QHS  . darbepoetin (ARANESP) injection - DIALYSIS  100 mcg Intravenous Q Sat-HD  .  famotidine  40 mg Oral Daily  . feeding supplement  1 Container Oral BID BM  . hydrALAZINE  50 mg Oral 3 times per day  . metoprolol succinate  100 mg Oral Daily  . mycophenolate  1,500 mg Oral BID  . nystatin   Topical TID  . predniSONE  20 mg Oral TID   Elmarie Shiley, MD 12/13/2014, 11:52 AM

## 2014-12-13 NOTE — Progress Notes (Signed)
CRITICAL VALUE ALERT  Critical value received:  Hemoglobin 6.7  Date of notification: 12/13/2014  Time of notification:  0640  Critical value read back:Yes.    Nurse who received alert:  Job Founds, RN  MD notified (1st page):  Oncall MD    Time of first page:  [phone]  Responding MD:  Merrilee Seashore, MD  Time MD responded:  [phone]

## 2014-12-13 NOTE — Progress Notes (Signed)
Subjective: Tolerated hemodialysis well yesterday, feeling fatigued but otherwise well and tolerating diet without dificulty. Bleeding from R femoral HD catheter site resumed overnight and required multiple dressing changes. Slow oozing this morning.  Objective: Vital signs in last 24 hours: Filed Vitals:   12/13/14 0539 12/13/14 0542 12/13/14 1154 12/13/14 1230  BP: 149/78 149/78 154/84 145/77  Pulse:  96 102 90  Temp:  98 F (36.7 C) 98.3 F (36.8 C) 99.6 F (37.6 C)  TempSrc:  Oral Oral Oral  Resp:  20 18 18   Height:      Weight:      SpO2:  100% 100% 98%   Weight change: -2.2 kg (-4 lb 13.6 oz)  Intake/Output Summary (Last 24 hours) at 12/13/14 1305 Last data filed at 12/13/14 1230  Gross per 24 hour  Intake    550 ml  Output    170 ml  Net    380 ml   GENERAL- alert, orientedx3, NAD HEENT- Mild pallor of lips and oral mucosa CARDIAC- RRR, no murmurs, rubs or gallops. RESP- CTAB, no wheezes or crackles. ABDOMEN- No left sided abdominal TTP on exam, yeast rash at waistline under abdominal pannus, with mild tenderness and pruritis EXTREMITIES- No pedal edema SKIN- Warm, dry, Wound dressings are all clean dry and intact at [place], RUE puncture site, RLE HD catheter blood noted around catheter but not soaked through dressing [facility]- Appropriate, pleasant  Lab Results: Basic Metabolic Panel:  Recent Labs Lab 12/12/14 0900 12/13/14 0506  NA 132* 134*  K 5.0 4.5  CL 96* 97*  CO2 21* 27  GLUCOSE 133* 128*  BUN 88* 56*  CREATININE 5.98* 4.43*  CALCIUM 8.7* 8.4*  PHOS 9.4* 7.0*   Liver Function Tests:  Recent Labs Lab 12/07/14 1707  12/12/14 0900 12/13/14 0506  ALT 26  --   --   --   ALBUMIN 3.1*  < > 3.0* 2.8*  < > = values in this interval not displayed. No results for input(s): LIPASE, AMYLASE in the last 168 hours. No results for input(s): AMMONIA in the last 168 hours. CBC:  Recent Labs Lab 12/11/14 1010 12/12/14 0900 12/13/14 0506  WBC  12.2* 16.0* 12.3*  NEUTROABS 11.6*  --  11.9*  HGB 8.1* 7.9* 6.7*  HCT 24.0* 22.7* 19.9*  MCV 85.4 85.7 86.9  PLT 130* 175 134*   Cardiac Enzymes: No results for input(s): CKTOTAL, CKMB, CKMBINDEX, TROPONINI in the last 168 hours. BNP: No results for input(s): PROBNP in the last 168 hours. D-Dimer: No results for input(s): DDIMER in the last 168 hours. CBG: No results for input(s): GLUCAP in the last 168 hours. Hemoglobin A1C: No results for input(s): HGBA1C in the last 168 hours. Fasting Lipid Panel: No results for input(s): CHOL, HDL, LDLCALC, TRIG, CHOLHDL, LDLDIRECT in the last 168 hours. Thyroid Function Tests: No results for input(s): TSH, T4TOTAL, FREET4, T3FREE, THYROIDAB in the last 168 hours. Coagulation:  Recent Labs Lab 12/06/14 1440  LABPROT 13.9  INR 1.05   Anemia Panel: No results for input(s): VITAMINB12, FOLATE, FERRITIN, TIBC, IRON, RETICCTPCT in the last 168 hours. Urine Drug Screen: Drugs of Abuse     Component Value Date/Time   LABOPIA NONE DETECTED 07/21/2012 0215   COCAINSCRNUR NONE DETECTED 07/21/2012 0215   LABBENZ NONE DETECTED 07/21/2012 0215   AMPHETMU NONE DETECTED 07/21/2012 0215   THCU NONE DETECTED 07/21/2012 0215   LABBARB NONE DETECTED 07/21/2012 0215    Alcohol Level: No results for input(s): ETH  in the last 168 hours. Urinalysis: No results for input(s): COLORURINE, LABSPEC, PHURINE, GLUCOSEU, HGBUR, BILIRUBINUR, KETONESUR, PROTEINUR, UROBILINOGEN, NITRITE, LEUKOCYTESUR in the last 168 hours.  Invalid input(s): APPERANCEUR   Micro Results: Recent Results (from the past 240 hour(s))  Urine culture     Status: None   Collection Time: 12/03/14  1:44 PM  Result Value Ref Range Status   Specimen Description URINE, CLEAN CATCH  Final   Special Requests Normal  Final   Culture >=100,000 COLONIES/mL KLEBSIELLA PNEUMONIAE  Final   Report Status 12/06/2014 FINAL  Final   Organism ID, Bacteria KLEBSIELLA PNEUMONIAE  Final       Susceptibility   Klebsiella pneumoniae - MIC*    AMPICILLIN 4 RESISTANT Resistant     CEFAZOLIN <=4 SENSITIVE Sensitive     CEFTRIAXONE <=1 SENSITIVE Sensitive     CIPROFLOXACIN <=0.25 SENSITIVE Sensitive     GENTAMICIN <=1 SENSITIVE Sensitive     IMIPENEM <=0.25 SENSITIVE Sensitive     NITROFURANTOIN 64 INTERMEDIATE Intermediate     TRIMETH/SULFA <=20 SENSITIVE Sensitive     AMPICILLIN/SULBACTAM 4 SENSITIVE Sensitive     PIP/TAZO 8 SENSITIVE Sensitive     * >=100,000 COLONIES/mL KLEBSIELLA PNEUMONIAE   Studies/Results: No results found. Medications: I have reviewed the patient's current medications. Scheduled Meds: . sodium chloride   Intravenous Once  . sodium chloride   Intravenous Once  . sodium chloride   Intravenous Once  . amLODipine  10 mg Oral Daily  . atorvastatin  20 mg Oral q1800  . calcium acetate  1,334 mg Oral TID WC  . cyclobenzaprine  10 mg Oral QHS  . darbepoetin (ARANESP) injection - DIALYSIS  100 mcg Intravenous Q Sat-HD  . famotidine  40 mg Oral Daily  . feeding supplement  1 Container Oral BID BM  . hydrALAZINE  50 mg Oral 3 times per day  . metoprolol succinate  100 mg Oral Daily  . mycophenolate  1,500 mg Oral BID  . nystatin   Topical TID  . 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 Abx completed for UTI. WBC 12.3, Hgb decreased to 6.7, platelets deceased to 134. BUN down to 56 with HD but ongoing bleeding may still be coming from uremic platelets. - continue HD per nephro. MWF schedule - Continue prednisone 20mg  PO TID - Continue Cellcept 1500mg  PO BID - Continue zofran 4mg  ODT q6hrs PRN - Continue PhosLo 1334mg  TIDWM - Follow daily renal function panel, CBC - Nephrology following pt, recs  appreciated  Candidal rash: Along waistline under abdominal pannus. Skin intact, mild tenderness and pruritis. 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 Bleeding has resumed from R femoral HD catheter site. Will transfuse and manage with pressure dressing. Stitching likely to provide limited benefit towards obtaining hemostasis. - No heparin  Anemia: 2/2 to renal impairment + acute blood loss. AOCD baseline 10.9 on admission. Hgb dropped yesterday, down to 6.7 this AM. - Bed rest activity level - Transfuse 2 units pRBCs - F/U post transfusion H&H - Continue transfuse on HD as needed  HTN: SBPs resistant to CCB and BB therapy. BP stayed high with HD so far. - Continue Amlodipine 10mg  PO BID - Increase Metoprolol succinate 100mg  PO daily - Continue Hydralazine to 50mg  TID  Back and  flank pain: Pain present since renal biopsy. - Percocet 5-325mg  q6hrs PRN - Flexeril 10mg  qHS PRN  SVT: s/p ablation 2014, has some recurrent palpitations in recent months, currently holter monitor on board. HLD: Atorvastatin 20mg  PO Hyperkalemia: Resolved  FEN: Renal diet, fluid restricted DVT ppx: SCDs FULL CODE  Dispo: Disposition deferred until improvement in clinical condition. Patient starting on hemodialysis and will require several days before stable for discharge.  The patient does have a current PCP Lin Landsman, MD) and does not need an [facility] hospital follow-up appointment after discharge.  The patient does not have transportation limitations that hinder transportation to clinic appointments.   LOS: 10 days   Collier Salina, MD 12/13/2014, 1:05 PM

## 2014-12-14 ENCOUNTER — Encounter (HOSPITAL_COMMUNITY): Payer: Self-pay | Admitting: Vascular Surgery

## 2014-12-14 ENCOUNTER — Encounter (HOSPITAL_COMMUNITY)
Admission: EM | Disposition: A | Discharge status: Rehabilitation Facility | Payer: Self-pay | Source: Home / Self Care | Attending: Internal Medicine

## 2014-12-14 HISTORY — PX: PERIPHERAL VASCULAR CATHETERIZATION: SHX172C

## 2014-12-14 LAB — BASIC METABOLIC PANEL
Anion gap: 12 (ref 5–15)
BUN: 80 mg/dL — ABNORMAL HIGH (ref 6–20)
CHLORIDE: 96 mmol/L — AB (ref 101–111)
CO2: 25 mmol/L (ref 22–32)
CREATININE: 5.57 mg/dL — AB (ref 0.44–1.00)
Calcium: 8.5 mg/dL — ABNORMAL LOW (ref 8.9–10.3)
GFR calc non Af Amer: 9 mL/min — ABNORMAL LOW (ref >60)
GFR, EST AFRICAN AMERICAN: 10 mL/min — AB (ref >60)
GLUCOSE: 152 mg/dL — AB (ref 65–99)
Potassium: 4.7 mmol/L (ref 3.5–5.1)
Sodium: 133 mmol/L — ABNORMAL LOW (ref 135–145)

## 2014-12-14 LAB — PROTIME-INR
INR: 0.98 (ref 0.00–1.49)
PROTHROMBIN TIME: 13.2 s (ref 11.6–15.2)

## 2014-12-14 LAB — CBC
HEMATOCRIT: 24.4 % — AB (ref 36.0–46.0)
HEMOGLOBIN: 8.5 g/dL — AB (ref 12.0–15.0)
MCH: 30.1 pg (ref 26.0–34.0)
MCHC: 34.8 g/dL (ref 30.0–36.0)
MCV: 86.5 fL (ref 78.0–100.0)
Platelets: 140 10*3/uL — ABNORMAL LOW (ref 150–400)
RBC: 2.82 MIL/uL — ABNORMAL LOW (ref 3.87–5.11)
RDW: 13.3 % (ref 11.5–15.5)
WBC: 12.5 10*3/uL — ABNORMAL HIGH (ref 4.0–10.5)

## 2014-12-14 LAB — FIBRINOGEN: Fibrinogen: 256 mg/dL (ref 204–475)

## 2014-12-14 LAB — APTT: aPTT: 24 seconds (ref 24–37)

## 2014-12-14 SURGERY — UPPER EXTREMITY VENOGRAPHY
Anesthesia: LOCAL

## 2014-12-14 SURGICAL SUPPLY — 2 items
STOPCOCK MORSE 400PSI 3WAY (MISCELLANEOUS) ×1 IMPLANT
TUBING CIL FLEX 10 FLL-RA (TUBING) ×1 IMPLANT

## 2014-12-14 NOTE — Progress Notes (Signed)
Patient ID: [name], female   DOB: October 30, 1975, 39 y.o.   MRN: [id number]  [facility] Progress Note   Assessment/ Plan:   1. 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 Continue hemodialysis on a Monday/Wednesday/Friday schedule. Will get dialysis today. Patient to get central venograms today to rule out central vein stenosis given hx of radiofrequency ablation, PICC line, and right IJ. Bleeding has improved from right femoral dialysis catheter. Appreciate vascular surgery's help.  2. Hypertension: BP significantly elevated overnight in 409W systolic. Expect for BP to improve with HD and ultrafiltration. Continue Norvasc 10 mg daily, Hydralazine 50 mg Q8H, and Toprol-XL 100 mg daily.  3. Anemia: Acute drop in Hgb likely 2/2 bleeding from dialysis catheter site. Hgb stable at 8.5 this AM after 2 units PRBCs yesterday. Will cancel order for PRBC transfusion with dialysis.  4. Klebsiella UTI: Completed 5 day course of Rocephin/Keflex.  5. Metabolic acidosis- Resolved. Secondary to acute renal failure/chronic kidney disease and improved with hemodialysis.  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 PTH level 71. Will need to have PTH monitoring, check in 3 months.   Attending note to follow.   Subjective:   Reports bleeding has stopped around catheter, had bandage change last night. Reports she feels tired, but otherwise ok.    Objective:   BP 150/60 mmHg  Pulse 92  Temp(Src) 97.7 F (36.5 C) (Oral)  Resp 20  Ht 5\' 3"  (1.6 m)  Wt 236 lb 15.9 oz (107.5 kg)  BMI 41.99 kg/m2  SpO2 98%  LMP 11/17/2014 (Approximate)  Intake/Output Summary (Last 24 hours) at 12/14/14 1126 Last data filed at 12/14/14 0852  Gross per 24 hour  Intake    698 ml  Output    310 ml  Net    388 ml   Weight  change: -2 lb 10.3 oz (-1.2 kg)  Physical Exam: Gen: alert, sitting up in bed, pleasant, NAD  CVS: RRR, no m/g/r Resp: CTA bilaterally, breaths non-labored Abd: BS+, soft, obese, non-tender Ext: trace LE edema. Right femoral tunneled dialysis catheter  Imaging: No results found.  Labs: BMET  Recent Labs Lab 12/07/14 1707 12/08/14 0525 12/08/14 1830 12/10/14 0621 12/11/14 1010 12/12/14 0900 12/13/14 0506 12/14/14 0405  NA 137 138 135 135 135 132* 134* 133*  K 4.8 4.5 4.6 4.6 4.5 5.0 4.5 4.7  CL 109 106 102 101 99* 96* 97* 96*  CO2 12* 18* 16* 21* 24 21* 27 25  GLUCOSE 122* 126* 137* 150* 143* 133* 128* 152*  BUN 170* 129* 130* 106* 65* 88* 56* 80*  CREATININE 8.34* 6.38* 6.63* 5.78* 4.82* 5.98* 4.43* 5.57*  CALCIUM 8.3* 8.1* 8.3* 8.4* 8.6* 8.7* 8.4* 8.5*  PHOS 12.3* 10.9* 11.9* 9.6*  --  9.4* 7.0*  --    CBC  Recent Labs Lab 12/09/14 0515 12/10/14 0622 12/11/14 1010 12/12/14 0900 12/13/14 0506 12/13/14 1650 12/14/14 0405  WBC 8.8 13.2* 12.2* 16.0* 12.3* 14.2* 12.5*  NEUTROABS 8.3* 12.4* 11.6*  --  11.9*  --   --   HGB 8.9* 9.1* 8.1* 7.9* 6.7* 8.5* 8.5*  HCT 25.3* 25.4* 24.0* 22.7* 19.9* 24.8* 24.4*  MCV 83.0 83.8 85.4 85.7 86.9 86.4 86.5  PLT 79* 114* 130* 175 134* 146* 140*    Medications:    . sodium chloride   Intravenous Once  .  sodium chloride   Intravenous Once  . amLODipine  10 mg Oral Daily  . atorvastatin  20 mg Oral q1800  . calcium acetate  1,334 mg Oral TID WC  . cyclobenzaprine  10 mg Oral QHS  . [START ON 12/17/2014] darbepoetin (ARANESP) injection - DIALYSIS  100 mcg Intravenous Q Mon-HD  . famotidine  40 mg Oral Daily  . feeding supplement  1 Container Oral BID BM  . hydrALAZINE  50 mg Oral 3 times per day  . metoprolol succinate  100 mg Oral Daily  . mycophenolate  1,500 mg Oral BID  . nystatin   Topical TID  . predniSONE  20 mg Oral TID   [name], MD, MPH IMTS- PGY-II 12/14/2014, 11:26 AM

## 2014-12-14 NOTE — Progress Notes (Signed)
Subjective: Feels tired otherwise well this morning. Bleeding from R femoral HD catheter is decreased but did require one dressing change o/n for oozing blood. Peripheral IVs were sucessfuly placed without bleeding. Vein mapping finished. Will be dialyzed today.  Objective: Vital signs in last 24 hours: Filed Vitals:   12/14/14 1049 12/14/14 1054 12/14/14 1113 12/14/14 1114  BP:    150/60  Pulse: 0 107 92   Temp:   97.7 F (36.5 C)   TempSrc:   Oral   Resp: 23 10 20    Height:      Weight:      SpO2: 0% 0% 98%    Weight change: -1.2 kg (-2 lb 10.3 oz)  Intake/Output Summary (Last 24 hours) at 12/14/14 1147 Last data filed at 12/14/14 0852  Gross per 24 hour  Intake    698 ml  Output    310 ml  Net    388 ml   GENERAL- alert, orientedx3, NAD HEENT- Mild pallor of lips and oral mucosa CARDIAC- RRR, no murmurs, rubs or gallops. RESP- CTAB, no wheezes or crackles. ABDOMEN- No left sided abdominal TTP on exam EXTREMITIES- No pedal edema SKIN- Warm, dry, Wound dressings are all clean dry and intact at [place], RUE puncture site, RLE HD catheter blood noted around catheter but not soaked through dressing [facility]- Appropriate, pleasant  Lab Results: Basic Metabolic Panel:  Recent Labs Lab 12/12/14 0900 12/13/14 0506 12/14/14 0405  NA 132* 134* 133*  K 5.0 4.5 4.7  CL 96* 97* 96*  CO2 21* 27 25  GLUCOSE 133* 128* 152*  BUN 88* 56* 80*  CREATININE 5.98* 4.43* 5.57*  CALCIUM 8.7* 8.4* 8.5*  PHOS 9.4* 7.0*  --    Liver Function Tests:  Recent Labs Lab 12/07/14 1707  12/12/14 0900 12/13/14 0506  ALT 26  --   --   --   ALBUMIN 3.1*  < > 3.0* 2.8*  < > = values in this interval not displayed. No results for input(s): LIPASE, AMYLASE in the last 168 hours. No results for input(s): AMMONIA in the last 168 hours. CBC:  Recent Labs Lab 12/11/14 1010  12/13/14 0506 12/13/14 1650 12/14/14 0405  WBC 12.2*  < > 12.3* 14.2* 12.5*  NEUTROABS 11.6*  --  11.9*  --    --   HGB 8.1*  < > 6.7* 8.5* 8.5*  HCT 24.0*  < > 19.9* 24.8* 24.4*  MCV 85.4  < > 86.9 86.4 86.5  PLT 130*  < > 134* 146* 140*  < > = values in this interval not displayed. Cardiac Enzymes: No results for input(s): CKTOTAL, CKMB, CKMBINDEX, TROPONINI in the last 168 hours. BNP: No results for input(s): PROBNP in the last 168 hours. D-Dimer: No results for input(s): DDIMER in the last 168 hours. CBG: No results for input(s): GLUCAP in the last 168 hours. Hemoglobin A1C: No results for input(s): HGBA1C in the last 168 hours. Fasting Lipid Panel: No results for input(s): CHOL, HDL, LDLCALC, TRIG, CHOLHDL, LDLDIRECT in the last 168 hours. Thyroid Function Tests: No results for input(s): TSH, T4TOTAL, FREET4, T3FREE, THYROIDAB in the last 168 hours. Coagulation:  Recent Labs Lab 12/14/14 0405  LABPROT 13.2  INR 0.98   Anemia Panel: No results for input(s): VITAMINB12, FOLATE, FERRITIN, TIBC, IRON, RETICCTPCT in the last 168 hours. Urine Drug Screen: Drugs of Abuse     Component Value Date/Time   LABOPIA NONE DETECTED 07/21/2012 0215   COCAINSCRNUR NONE DETECTED 07/21/2012 0215  LABBENZ NONE DETECTED 07/21/2012 0215   AMPHETMU NONE DETECTED 07/21/2012 0215   THCU NONE DETECTED 07/21/2012 0215   LABBARB NONE DETECTED 07/21/2012 0215    Alcohol Level: No results for input(s): ETH in the last 168 hours. Urinalysis: No results for input(s): COLORURINE, LABSPEC, PHURINE, GLUCOSEU, HGBUR, BILIRUBINUR, KETONESUR, PROTEINUR, UROBILINOGEN, NITRITE, LEUKOCYTESUR in the last 168 hours.  Invalid input(s): APPERANCEUR   Micro Results: No results found for this or any previous visit (from the past 240 hour(s)). Studies/Results: No results found. Medications: I have reviewed the patient's current medications. Scheduled Meds: . sodium chloride   Intravenous Once  . sodium chloride   Intravenous Once  . amLODipine  10 mg Oral Daily  . atorvastatin  20 mg Oral q1800  . calcium  acetate  1,334 mg Oral TID WC  . cyclobenzaprine  10 mg Oral QHS  . [START ON 12/17/2014] darbepoetin (ARANESP) injection - DIALYSIS  100 mcg Intravenous Q Mon-HD  . famotidine  40 mg Oral Daily  . feeding supplement  1 Container Oral BID BM  . hydrALAZINE  50 mg Oral 3 times per day  . metoprolol succinate  100 mg Oral Daily  . mycophenolate  1,500 mg Oral BID  . nystatin   Topical TID  . 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 Oliguric. Oozing bleed from R femoral catheter site decreased but still present. Going for dialysis today. - continue HD per nephro. MWF schedule - Continue prednisone 20mg  PO TID - Continue Cellcept 1500mg  PO BID - Continue zofran 4mg  ODT q6hrs PRN - Continue PhosLo 1334mg  TIDWM - Follow daily renal function panel, CBC - Nephrology following pt, recs appreciated  Candidal rash: Along waistline under abdominal pannus. On visual inspection, rash is gone. Still pruritic to patient. - Nystatin topical TID  Thrombocytopenia and qualitative platelet dysfunction  Will transfuse as needed and manage with pressure dressing. - No heparin - Pt already received 2x doses DDAVP  Anemia: 2/2 to renal impairment + acute blood loss. AOCD baseline 10.9 on admission. Hgb improved to 8.5 from 6.7 after transfusion yesterday. Continue tranfusion PRN with dialysis. - Bed rest activity level - Continue transfuse on HD as needed - Follow qAM CBCs  HTN: SBPs resistant to CCB and BB therapy. BP stayed high with HD so far. - Continue Amlodipine 10mg  PO BID - Continue Metoprolol succinate 100mg  PO daily - Continue Hydralazine 50mg  TID  Back and flank pain: Pain present since renal biopsy. - Percocet 5-325mg  q6hrs PRN - Flexeril 10mg  qHS PRN  SVT: s/p  ablation 2014, has some recurrent palpitations in recent months, currently holter monitor on board. HLD: Atorvastatin 20mg  PO Hyperkalemia: Resolved  FEN: Renal diet, fluid restricted DVT ppx: SCDs FULL CODE  Dispo: Disposition deferred until improvement in clinical condition. Patient starting on hemodialysis and will require several days before stable for discharge.  The patient does have a current PCP Lin Landsman, MD) and does not need an [facility] hospital follow-up appointment after discharge.  The patient does not have transportation limitations that hinder transportation to clinic appointments.   LOS: 11 days   Collier Salina, MD 12/14/2014, 11:47 AM

## 2014-12-14 NOTE — Progress Notes (Signed)
Hemodialysis: Dressing on right groin (access) site was found bloody. Changed dressing. Primary nurse stated that patient needed blood to be administered. Dr. Posey Pronto was notified the blood and he stated that the patient is not to received blood due to her getting one Unit of blood yesterday. The order was put in for the blood to be administered with dialysis yesterday. Dr. Posey Pronto stated that patient did not need blood today due to patient's hemoglobin level coming up from 6.8 to 8.5. Will continue to monitor the patient diligently.

## 2014-12-14 NOTE — Procedures (Signed)
Patient seen on Hemodialysis. QB 400, UF goal 3.5L Treatment adjusted as needed.  Elmarie Shiley MD [facility]. Office # [phone] Pager # [phone] 12:59 PM

## 2014-12-15 LAB — RENAL FUNCTION PANEL
Albumin: 2.7 g/dL — ABNORMAL LOW (ref 3.5–5.0)
Anion gap: 11 (ref 5–15)
BUN: 50 mg/dL — AB (ref 6–20)
CALCIUM: 8.1 mg/dL — AB (ref 8.9–10.3)
CHLORIDE: 96 mmol/L — AB (ref 101–111)
CO2: 26 mmol/L (ref 22–32)
CREATININE: 4.01 mg/dL — AB (ref 0.44–1.00)
GFR calc Af Amer: 15 mL/min — ABNORMAL LOW (ref >60)
GFR, EST NON AFRICAN AMERICAN: 13 mL/min — AB (ref >60)
Glucose, Bld: 124 mg/dL — ABNORMAL HIGH (ref 65–99)
Phosphorus: 6.4 mg/dL — ABNORMAL HIGH (ref 2.5–4.6)
Potassium: 4.4 mmol/L (ref 3.5–5.1)
SODIUM: 133 mmol/L — AB (ref 135–145)

## 2014-12-15 LAB — FOLATE: Folate: 4.2 ng/mL — ABNORMAL LOW (ref >5.9)

## 2014-12-15 LAB — CBC
HCT: 22 % — ABNORMAL LOW (ref 36.0–46.0)
Hemoglobin: 7.4 g/dL — ABNORMAL LOW (ref 12.0–15.0)
MCH: 29.5 pg (ref 26.0–34.0)
MCHC: 33.6 g/dL (ref 30.0–36.0)
MCV: 87.6 fL (ref 78.0–100.0)
PLATELETS: 123 10*3/uL — AB (ref 150–400)
RBC: 2.51 MIL/uL — ABNORMAL LOW (ref 3.87–5.11)
RDW: 13.1 % (ref 11.5–15.5)
WBC: 10.2 10*3/uL (ref 4.0–10.5)

## 2014-12-15 LAB — HEMOGLOBIN AND HEMATOCRIT, BLOOD
HCT: 21 % — ABNORMAL LOW (ref 36.0–46.0)
Hemoglobin: 7.1 g/dL — ABNORMAL LOW (ref 12.0–15.0)

## 2014-12-15 LAB — RETICULOCYTES
RBC.: 2.53 MIL/uL — ABNORMAL LOW (ref 3.87–5.11)
RETIC CT PCT: 1.7 % (ref 0.4–3.1)
Retic Count, Absolute: 43 10*3/uL (ref 19.0–186.0)

## 2014-12-15 LAB — VITAMIN B12: Vitamin B-12: 480 pg/mL (ref 180–914)

## 2014-12-15 LAB — FERRITIN: FERRITIN: 139 ng/mL (ref 11–307)

## 2014-12-15 LAB — IRON AND TIBC
Iron: 89 ug/dL (ref 28–170)
Saturation Ratios: 37 % — ABNORMAL HIGH (ref 10.4–31.8)
TIBC: 238 ug/dL — ABNORMAL LOW (ref 250–450)
UIBC: 149 ug/dL

## 2014-12-15 MED ORDER — FOLIC ACID 1 MG PO TABS
1.0000 mg | ORAL_TABLET | Freq: Every day | ORAL | Status: DC
Start: 2014-12-15 — End: 2015-01-05
  Administered 2014-12-15 – 2015-01-05 (×21): 1 mg via ORAL
  Filled 2014-12-15 (×21): qty 1

## 2014-12-15 MED ORDER — ISOSORBIDE MONONITRATE ER 30 MG PO TB24
30.0000 mg | ORAL_TABLET | Freq: Every day | ORAL | Status: DC
  Administered 2014-12-15 – 2015-01-04 (×20): 30 mg via ORAL
  Filled 2014-12-15 (×22): qty 1

## 2014-12-15 NOTE — Progress Notes (Addendum)
Subjective: Bleeding from R femoral catheter site still intermittently. Not complaining of any pain or shortness of breath. Tolerated dialysis yesterday without difficulty. Eating, denies any nausea. States her mood is overall very bored and awaits getting better.  Objective: Vital signs in last 24 hours: Filed Vitals:   12/14/14 1712 12/14/14 2227 12/15/14 0555 12/15/14 1057  BP: 161/83 154/76 183/83 148/75  Pulse: 105 88 95 100  Temp: 98.3 F (36.8 C) 98.2 F (36.8 C) 98.2 F (36.8 C)   TempSrc: Oral Oral Oral   Resp: 20 20 18 18   Height:      Weight:   107 kg (235 lb 14.3 oz)   SpO2: 99% 98% 99%    Weight change: 1 kg (2 lb 3.3 oz)  Intake/Output Summary (Last 24 hours) at 12/15/14 1315 Last data filed at 12/14/14 1833  Gross per 24 hour  Intake    222 ml  Output   2920 ml  Net  -2698 ml   GENERAL- alert, orientedx3, NAD HEENT- Some minor excoriations around R forehead, "my skin starts breaking out sometimes" CARDIAC- RRR, no murmurs, rubs or gallops. RESP- CTAB, no wheezes or crackles. ABDOMEN- Nontender, not distended EXTREMITIES- Trace pedal edema, dry dressings intact around R fem catheter SKIN- Warm, dry, Wound dressings are all clean dry and intact at [place], RUE puncture site, RLE HD catheter blood noted around catheter but not soaked through dressing [facility]- Appropriate, pleasant  Lab Results: Basic Metabolic Panel:  Recent Labs Lab 12/13/14 0506 12/14/14 0405 12/15/14 0440  NA 134* 133* 133*  K 4.5 4.7 4.4  CL 97* 96* 96*  CO2 27 25 26   GLUCOSE 128* 152* 124*  BUN 56* 80* 50*  CREATININE 4.43* 5.57* 4.01*  CALCIUM 8.4* 8.5* 8.1*  PHOS 7.0*  --  6.4*   Liver Function Tests:  Recent Labs Lab 12/13/14 0506 12/15/14 0440  ALBUMIN 2.8* 2.7*   No results for input(s): LIPASE, AMYLASE in the last 168 hours. No results for input(s): AMMONIA in the last 168 hours. CBC:  Recent Labs Lab 12/11/14 1010  12/13/14 0506  12/14/14 0405  12/15/14 0440  WBC 12.2*  < > 12.3*  < > 12.5* 10.2  NEUTROABS 11.6*  --  11.9*  --   --   --   HGB 8.1*  < > 6.7*  < > 8.5* 7.4*  HCT 24.0*  < > 19.9*  < > 24.4* 22.0*  MCV 85.4  < > 86.9  < > 86.5 87.6  PLT 130*  < > 134*  < > 140* 123*  < > = values in this interval not displayed. Cardiac Enzymes: No results for input(s): CKTOTAL, CKMB, CKMBINDEX, TROPONINI in the last 168 hours. BNP: No results for input(s): PROBNP in the last 168 hours. D-Dimer: No results for input(s): DDIMER in the last 168 hours. CBG: No results for input(s): GLUCAP in the last 168 hours. Hemoglobin A1C: No results for input(s): HGBA1C in the last 168 hours. Fasting Lipid Panel: No results for input(s): CHOL, HDL, LDLCALC, TRIG, CHOLHDL, LDLDIRECT in the last 168 hours. Thyroid Function Tests: No results for input(s): TSH, T4TOTAL, FREET4, T3FREE, THYROIDAB in the last 168 hours. Coagulation:  Recent Labs Lab 12/14/14 0405  LABPROT 13.2  INR 0.98   Anemia Panel:  Recent Labs Lab 12/15/14 1050  VITAMINB12 480  FOLATE 4.2*  FERRITIN 139  TIBC 238*  IRON 89  RETICCTPCT 1.7   Urine Drug Screen: Drugs of Abuse  Component Value Date/Time   LABOPIA NONE DETECTED 07/21/2012 0215   COCAINSCRNUR NONE DETECTED 07/21/2012 0215   LABBENZ NONE DETECTED 07/21/2012 0215   AMPHETMU NONE DETECTED 07/21/2012 0215   THCU NONE DETECTED 07/21/2012 0215   LABBARB NONE DETECTED 07/21/2012 0215    Alcohol Level: No results for input(s): ETH in the last 168 hours. Urinalysis: No results for input(s): COLORURINE, LABSPEC, PHURINE, GLUCOSEU, HGBUR, BILIRUBINUR, KETONESUR, PROTEINUR, UROBILINOGEN, NITRITE, LEUKOCYTESUR in the last 168 hours.  Invalid input(s): APPERANCEUR   Micro Results: No results found for this or any previous visit (from the past 240 hour(s)). Studies/Results: No results found. Medications: I have reviewed the patient's current medications. Scheduled Meds: . sodium chloride    Intravenous Once  . sodium chloride   Intravenous Once  . amLODipine  10 mg Oral Daily  . atorvastatin  20 mg Oral q1800  . calcium acetate  1,334 mg Oral TID WC  . cyclobenzaprine  10 mg Oral QHS  . [START ON 12/17/2014] darbepoetin (ARANESP) injection - DIALYSIS  100 mcg Intravenous Q Mon-HD  . famotidine  40 mg Oral Daily  . feeding supplement  1 Container Oral BID BM  . hydrALAZINE  50 mg Oral 3 times per day  . metoprolol succinate  100 mg Oral Daily  . mycophenolate  1,500 mg Oral BID  . nystatin   Topical TID  . 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 Oozing bleed from R femoral catheter site decreased but still present. Going for dialysis Monday unless new acute indication. - continue HD per nephro. MWF schedule - Continue prednisone 20mg  PO TID - Continue Cellcept 1500mg  PO BID - Continue zofran 4mg  ODT q6hrs PRN - Continue PhosLo 1334mg  TIDWM - Follow daily renal function panel, CBC - Nephrology following pt, recs appreciated  Anemia: 2/2 to renal impairment + acute blood loss. AOCD baseline 10.9 on admission. Hgb worsened to 7.4 from 8.5 yesterday. Anemia panel today shows low folate, minimally reduced TIBC. Iron values may be inaccurate in setting of multiple recent transfusions. - Supplement folic acid - Bed rest activity level - Repeat PM H&H today - Continue transfuse PRN to Hgb > 7 - Follow qAM CBCs  HTN: BP still inadequately controlled on 3 medications and dialysis. SBPs 140s-180s. - Continue Amlodipine 10mg  PO - Continue Metoprolol succinate 100mg  PO daily - Continue Hydralazine 50mg  PO TID - Start Imdur 30mg  PO  Candidal rash: Along waistline under abdominal pannus. On visual inspection, rash is gone. Still pruritic to patient. - Nystatin  topical TID  Thrombocytopenia and qualitative platelet dysfunction  Will transfuse as needed and manage with pressure dressing. Pt already received 2x doses DDAVP. - No heparin   Back and flank pain: Pain present since renal biopsy. - Percocet 5-325mg  q6hrs PRN - Flexeril 10mg  qHS PRN  SVT: s/p ablation 2014, has some recurrent palpitations in recent months, currently holter monitor on board. HLD: Atorvastatin 20mg  PO Hyperkalemia: Resolved  FEN: Renal diet, fluid restricted DVT ppx: SCDs FULL CODE  Dispo: Disposition deferred until improvement in clinical condition. Patient on hemodialysis and will require several days before stable for discharge.  The patient does have a current PCP Lin Landsman, MD) and does not need an [facility] hospital follow-up appointment after discharge.  The patient does not have transportation limitations that hinder transportation to clinic appointments.   LOS:  12 days   Collier Salina, MD 12/15/2014, 1:15 PM

## 2014-12-15 NOTE — Progress Notes (Signed)
Patient ID: [name], female   DOB: 08/14/1975, 39 y.o.   MRN: [id number]  [facility] Progress Note   Assessment/ Plan:   1. 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 Continue hemodialysis on a Monday/Wednesday/Friday schedule. Received HD yesterday. Underwent bilateral central venogram yesterday. Access planning underway. Appreciate vascular surgery's help.  2. Hypertension: BP improved to 578 to 469 systolic overnight. Continue Norvasc 10 mg daily, Hydralazine 50 mg Q8H, and Toprol-XL 100 mg daily.  3. Anemia: Acute drop in Hgb likely 2/2 bleeding from dialysis catheter site. Hgb 7.4 from 8.5 this AM. Transfuse prn. 4. Klebsiella UTI: Completed 5 day course of Rocephin/Keflex.  5. Metabolic acidosis: Resolved. Secondary to acute renal failure/chronic kidney disease and improved with hemodialysis.  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 PTH level 71. Will need to have PTH monitoring, check in 3 months.   Subjective:   Bleeding improved. Reports fatigue this morning. Otherwise no new complaints. Denies shortness of breath.   Objective:   BP 183/83 mmHg  Pulse 95  Temp(Src) 98.2 F (36.8 C) (Oral)  Resp 18  Ht 5\' 3"  (1.6 m)  Wt 235 lb 14.3 oz (107 kg)  BMI 41.80 kg/m2  SpO2 99%  LMP 11/17/2014 (Approximate)  Intake/Output Summary (Last 24 hours) at 12/15/14 0913 Last data filed at 12/14/14 1833  Gross per 24 hour  Intake    222 ml  Output   2920 ml  Net  -2698 ml   Weight change: 2 lb 3.3 oz (1 kg)  Physical Exam: Gen: alert, sitting up in bed, pleasant, NAD  CVS: RRR, no m/g/r Resp: CTA bilaterally, breaths non-labored Abd: BS+, soft, obese, non-tender Ext: trace LE edema. Right femoral tunneled dialysis catheter  Imaging: No results  found.  Labs: BMET  Recent Labs Lab 12/08/14 1830 12/10/14 0621 12/11/14 1010 12/12/14 0900 12/13/14 0506 12/14/14 0405 12/15/14 0440  NA 135 135 135 132* 134* 133* 133*  K 4.6 4.6 4.5 5.0 4.5 4.7 4.4  CL 102 101 99* 96* 97* 96* 96*  CO2 16* 21* 24 21* 27 25 26   GLUCOSE 137* 150* 143* 133* 128* 152* 124*  BUN 130* 106* 65* 88* 56* 80* 50*  CREATININE 6.63* 5.78* 4.82* 5.98* 4.43* 5.57* 4.01*  CALCIUM 8.3* 8.4* 8.6* 8.7* 8.4* 8.5* 8.1*  PHOS 11.9* 9.6*  --  9.4* 7.0*  --  6.4*   CBC  Recent Labs Lab 12/09/14 0515 12/10/14 0622 12/11/14 1010  12/13/14 0506 12/13/14 1650 12/14/14 0405 12/15/14 0440  WBC 8.8 13.2* 12.2*  < > 12.3* 14.2* 12.5* 10.2  NEUTROABS 8.3* 12.4* 11.6*  --  11.9*  --   --   --   HGB 8.9* 9.1* 8.1*  < > 6.7* 8.5* 8.5* 7.4*  HCT 25.3* 25.4* 24.0*  < > 19.9* 24.8* 24.4* 22.0*  MCV 83.0 83.8 85.4  < > 86.9 86.4 86.5 87.6  PLT 79* 114* 130*  < > 134* 146* 140* 123*  < > = values in this interval not displayed.  Medications:    . sodium chloride   Intravenous Once  . sodium chloride   Intravenous Once  . amLODipine  10 mg Oral Daily  . atorvastatin  20 mg Oral q1800  . calcium acetate  1,334 mg Oral TID WC  . cyclobenzaprine  10 mg Oral QHS  . [  START ON 12/17/2014] darbepoetin (ARANESP) injection - DIALYSIS  100 mcg Intravenous Q Mon-HD  . famotidine  40 mg Oral Daily  . feeding supplement  1 Container Oral BID BM  . hydrALAZINE  50 mg Oral 3 times per day  . metoprolol succinate  100 mg Oral Daily  . mycophenolate  1,500 mg Oral BID  . nystatin   Topical TID  . predniSONE  20 mg Oral TID   Jacques Earthly, MD  Internal Medicine PGY-2  12/15/2014, 9:13 AM

## 2014-12-16 DIAGNOSIS — Z9889 Other specified postprocedural states: Secondary | ICD-10-CM

## 2014-12-16 LAB — RENAL FUNCTION PANEL
ANION GAP: 10 (ref 5–15)
Albumin: 2.7 g/dL — ABNORMAL LOW (ref 3.5–5.0)
BUN: 68 mg/dL — ABNORMAL HIGH (ref 6–20)
CHLORIDE: 101 mmol/L (ref 101–111)
CO2: 25 mmol/L (ref 22–32)
Calcium: 8.4 mg/dL — ABNORMAL LOW (ref 8.9–10.3)
Creatinine, Ser: 5.75 mg/dL — ABNORMAL HIGH (ref 0.44–1.00)
GFR calc non Af Amer: 9 mL/min — ABNORMAL LOW (ref >60)
GFR, EST AFRICAN AMERICAN: 10 mL/min — AB (ref >60)
Glucose, Bld: 138 mg/dL — ABNORMAL HIGH (ref 65–99)
POTASSIUM: 4.5 mmol/L (ref 3.5–5.1)
Phosphorus: 6.5 mg/dL — ABNORMAL HIGH (ref 2.5–4.6)
Sodium: 136 mmol/L (ref 135–145)

## 2014-12-16 LAB — CBC WITH DIFFERENTIAL/PLATELET
Basophils Absolute: 0 10*3/uL (ref 0.0–0.1)
Basophils Relative: 0 %
EOS PCT: 0 %
Eosinophils Absolute: 0 10*3/uL (ref 0.0–0.7)
HCT: 20.3 % — ABNORMAL LOW (ref 36.0–46.0)
HEMOGLOBIN: 6.9 g/dL — AB (ref 12.0–15.0)
LYMPHS ABS: 0.2 10*3/uL — AB (ref 0.7–4.0)
LYMPHS PCT: 2 %
MCH: 30 pg (ref 26.0–34.0)
MCHC: 34 g/dL (ref 30.0–36.0)
MCV: 88.3 fL (ref 78.0–100.0)
Monocytes Absolute: 0.2 10*3/uL (ref 0.1–1.0)
Monocytes Relative: 2 %
NEUTROS PCT: 97 %
Neutro Abs: 8.7 10*3/uL — ABNORMAL HIGH (ref 1.7–7.7)
Platelets: 139 10*3/uL — ABNORMAL LOW (ref 150–400)
RBC: 2.3 MIL/uL — AB (ref 3.87–5.11)
RDW: 13.1 % (ref 11.5–15.5)
WBC: 9 10*3/uL (ref 4.0–10.5)

## 2014-12-16 LAB — CBC
HEMATOCRIT: 28.9 % — AB (ref 36.0–46.0)
Hemoglobin: 9.8 g/dL — ABNORMAL LOW (ref 12.0–15.0)
MCH: 29.8 pg (ref 26.0–34.0)
MCHC: 33.9 g/dL (ref 30.0–36.0)
MCV: 87.8 fL (ref 78.0–100.0)
PLATELETS: 146 10*3/uL — AB (ref 150–400)
RBC: 3.29 MIL/uL — AB (ref 3.87–5.11)
RDW: 13.3 % (ref 11.5–15.5)
WBC: 8.9 10*3/uL (ref 4.0–10.5)

## 2014-12-16 LAB — PREPARE RBC (CROSSMATCH)

## 2014-12-16 MED ORDER — SODIUM CHLORIDE 0.9 % IV SOLN
Freq: Once | INTRAVENOUS | Status: AC
  Administered 2014-12-16: 10:00:00 via INTRAVENOUS

## 2014-12-16 NOTE — Progress Notes (Signed)
Subjective: Patient feeling well today sitting upright in bed, more interactive. Hasn't noticed significant bleeding right femoral catheter site since yesterday morning. Does still feel tired.  Objective: Vital signs in last 24 hours: Filed Vitals:   12/15/14 2122 12/16/14 0536 12/16/14 0940 12/16/14 1020  BP: 143/67 125/77 129/63 137/61  Pulse: 104 100  98  Temp: 98.4 F (36.9 C) 98.3 F (36.8 C) 98.1 F (36.7 C) 98.1 F (36.7 C)  TempSrc: Oral Oral Oral Oral  Resp: 18 18 18 18   Height:      Weight:  108 kg (238 lb 1.6 oz)    SpO2: 98% 100% 99% 100%   Weight change: -0.5 kg (-1 lb 1.6 oz)  Intake/Output Summary (Last 24 hours) at 12/16/14 1305 Last data filed at 12/16/14 1100  Gross per 24 hour  Intake    140 ml  Output      0 ml  Net    140 ml   GENERAL- alert, orientedx3, NAD HEENT- Normocephalic, moist oral mucosa CARDIAC- RRR, no murmurs, rubs or gallops. RESP- CTAB, no wheezes or crackles. ABDOMEN- Nontender, not distended EXTREMITIES- No pedal edema, dry dressings intact around R fem catheter SKIN- Warm, dry, ecchymoses around LIJ incision site, dressing at R femoral HD cath dry [facility]- Appropriate, pleasant  Lab Results: Basic Metabolic Panel:  Recent Labs Lab 12/15/14 0440 12/16/14 0457  NA 133* 136  K 4.4 4.5  CL 96* 101  CO2 26 25  GLUCOSE 124* 138*  BUN 50* 68*  CREATININE 4.01* 5.75*  CALCIUM 8.1* 8.4*  PHOS 6.4* 6.5*   Liver Function Tests:  Recent Labs Lab 12/15/14 0440 12/16/14 0457  ALBUMIN 2.7* 2.7*   No results for input(s): LIPASE, AMYLASE in the last 168 hours. No results for input(s): AMMONIA in the last 168 hours. CBC:  Recent Labs Lab 12/13/14 0506  12/15/14 0440 12/15/14 2030 12/16/14 0457  WBC 12.3*  < > 10.2  --  9.0  NEUTROABS 11.9*  --   --   --  8.7*  HGB 6.7*  < > 7.4* 7.1* 6.9*  HCT 19.9*  < > 22.0* 21.0* 20.3*  MCV 86.9  < > 87.6  --  88.3  PLT 134*  < > 123*  --  139*  < > = values in this interval not  displayed. Cardiac Enzymes: No results for input(s): CKTOTAL, CKMB, CKMBINDEX, TROPONINI in the last 168 hours. BNP: No results for input(s): PROBNP in the last 168 hours. D-Dimer: No results for input(s): DDIMER in the last 168 hours. CBG: No results for input(s): GLUCAP in the last 168 hours. Hemoglobin A1C: No results for input(s): HGBA1C in the last 168 hours. Fasting Lipid Panel: No results for input(s): CHOL, HDL, LDLCALC, TRIG, CHOLHDL, LDLDIRECT in the last 168 hours. Thyroid Function Tests: No results for input(s): TSH, T4TOTAL, FREET4, T3FREE, THYROIDAB in the last 168 hours. Coagulation:  Recent Labs Lab 12/14/14 0405  LABPROT 13.2  INR 0.98   Anemia Panel:  Recent Labs Lab 12/15/14 1050  VITAMINB12 480  FOLATE 4.2*  FERRITIN 139  TIBC 238*  IRON 89  RETICCTPCT 1.7   Urine Drug Screen: Drugs of Abuse     Component Value Date/Time   LABOPIA NONE DETECTED 07/21/2012 0215   COCAINSCRNUR NONE DETECTED 07/21/2012 0215   LABBENZ NONE DETECTED 07/21/2012 0215   AMPHETMU NONE DETECTED 07/21/2012 0215   THCU NONE DETECTED 07/21/2012 0215   LABBARB NONE DETECTED 07/21/2012 0215    Alcohol Level: No  results for input(s): ETH in the last 168 hours. Urinalysis: No results for input(s): COLORURINE, LABSPEC, PHURINE, GLUCOSEU, HGBUR, BILIRUBINUR, KETONESUR, PROTEINUR, UROBILINOGEN, NITRITE, LEUKOCYTESUR in the last 168 hours.  Invalid input(s): APPERANCEUR   Micro Results: No results found for this or any previous visit (from the past 240 hour(s)). Studies/Results: No results found. Medications: I have reviewed the patient's current medications. Scheduled Meds: . sodium chloride   Intravenous Once  . sodium chloride   Intravenous Once  . amLODipine  10 mg Oral Daily  . atorvastatin  20 mg Oral q1800  . calcium acetate  1,334 mg Oral TID WC  . cyclobenzaprine  10 mg Oral QHS  . [START ON 12/17/2014] darbepoetin (ARANESP) injection - DIALYSIS  100 mcg  Intravenous Q Mon-HD  . famotidine  40 mg Oral Daily  . feeding supplement  1 Container Oral BID BM  . folic acid  1 mg Oral Daily  . hydrALAZINE  50 mg Oral 3 times per Hallie Ertl  . isosorbide mononitrate  30 mg Oral Daily  . metoprolol succinate  100 mg Oral Daily  . mycophenolate  1,500 mg Oral BID  . nystatin   Topical TID  . 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 R femoral cath site reportedly improved bleeding. Vascular plans for LUE fistula placement, at appropriate time TBD. - continue HD per nephro. MWF schedule - Continue prednisone 20mg  PO TID - Continue Cellcept 1500mg  PO BID - Continue zofran 4mg  ODT q6hrs PRN - Continue PhosLo 1334mg  TIDWM - Follow daily renal function panel, CBC - Nephrology following pt, recs appreciated  Anemia: 2/2 to renal impairment + acute blood loss. AOCD baseline 10.9 on admission. R femoral cath site reportedly improved bleeding but hgb was <7 today, will transfuse 2 units pRBCs. - Supplement folic acid - Repeat PM H&H today after transfusion of 2 units PRBCs today - Continue transfuse PRN to Hgb > 7 - Follow qAM CBCs  HTN: BP markedly improved with addition of Imdur. - Continue Amlodipine 10mg  PO - Continue Metoprolol succinate 100mg  PO daily - Continue Hydralazine 50mg  PO TID - Continue Imdur 30mg  PO  Candidal rash: Along waistline under abdominal pannus. On visual inspection, rash is gone. Still pruritic to patient. - Nystatin topical TID  Thrombocytopenia and qualitative platelet dysfunction  Will transfuse as needed and manage with pressure dressing. Pt already received 2x doses DDAVP. - No heparin  Back and flank pain: Pain present since renal biopsy. - Percocet 5-325mg  q6hrs PRN - Flexeril 10mg  qHS PRN  SVT: s/p  ablation 2014, has some recurrent palpitations in recent months, currently holter monitor on board. HLD: Atorvastatin 20mg  PO Hyperkalemia: Resolved  FEN: Renal diet, fluid restricted DVT ppx: SCDs FULL CODE  Dispo: Disposition deferred until improvement in clinical condition. Patient on hemodialysis and will require several days before stable for discharge.  The patient does have a current PCP Lin Landsman, MD) and does not need an [facility] hospital follow-up appointment after discharge.  The patient does not have transportation limitations that hinder transportation to clinic appointments.   LOS: 13 days   Collier Salina, MD 12/16/2014, 1:05 PM

## 2014-12-16 NOTE — Progress Notes (Signed)
    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  She is not a candidate for a fistula.  She could be considered for a HeRO graft vs a hybrid graft.  This decision will be made tomorrow after I discuss it further with renal.  Will plan on surgery on Tuesday  Annamarie Major 12/16/2014 4:43 PM --  Filed Vitals:   12/16/14 1625  BP: 139/73  Pulse: 96  Temp: 98 F (36.7 C)  Resp: 18    Intake/Output Summary (Last 24 hours) at 12/16/14 1643 Last data filed at 12/16/14 1610  Gross per 24 hour  Intake    753 ml  Output      0 ml  Net    753 ml     Laboratory CBC    Component Value Date/Time   WBC 9.0 12/16/2014 0457   HGB 6.9* 12/16/2014 0457   HCT 20.3* 12/16/2014 0457   PLT 139* 12/16/2014 0457    BMET    Component Value Date/Time   NA 136 12/16/2014 0457   K 4.5 12/16/2014 0457   CL 101 12/16/2014 0457   CO2 25 12/16/2014 0457   GLUCOSE 138* 12/16/2014 0457   BUN 68* 12/16/2014 0457   CREATININE 5.75* 12/16/2014 0457   CALCIUM 8.4* 12/16/2014 0457   GFRNONAA 9* 12/16/2014 0457   GFRAA 10* 12/16/2014 0457    COAG Lab Results  Component Value Date   INR 0.98 12/14/2014   INR 1.05 12/06/2014   INR 0.96 11/22/2014   No results found for: PTT  Antibiotics Anti-infectives    Start     Dose/Rate Route Frequency Ordered Stop   12/06/14 1545  ceFAZolin (ANCEF) IVPB 2 g/50 mL premix     2 g 100 mL/hr over 30 Minutes Intravenous To Radiology 12/06/14 1543 12/07/14 0859   12/06/14 1200  cephALEXin (KEFLEX) capsule 250 mg     250 mg Oral Every 12 hours 12/06/14 1116 12/10/14 0959   12/05/14 1400  cefTRIAXone (ROCEPHIN) 1 g in dextrose 5 % 50 mL IVPB  Status:  Discontinued     1 g 100 mL/hr over 30 Minutes Intravenous Every 24 hours 12/05/14 1113 12/06/14 1116   12/04/14 2000   ciprofloxacin (CIPRO) tablet 250 mg  Status:  Discontinued     250 mg Oral 2 times daily 12/04/14 1637 12/05/14 1147   12/04/14 1400  cefTRIAXone (ROCEPHIN) 1 g in dextrose 5 % 50 mL IVPB  Status:  Discontinued     1 g 100 mL/hr over 30 Minutes Intravenous Every 24 hours 12/03/14 1736 12/04/14 1637   12/04/14 0000  ciprofloxacin (CIPRO) 250 MG tablet     250 mg Oral 2 times daily 12/04/14 1647     12/03/14 1345  cefTRIAXone (ROCEPHIN) 1 g in dextrose 5 % 50 mL IVPB     1 g 100 mL/hr over 30 Minutes Intravenous  Once 12/03/14 1343 12/03/14 1551       V. Leia Alf, M.D. [facility] Office: [phone] Pager:  [phone]

## 2014-12-16 NOTE — Progress Notes (Signed)
CRITICAL VALUE ALERT  Critical value received:  HGB = 6.9  Date of notification:  12/16/14  Time of notification:  0526  Critical value read back:Yes.    Nurse who received alert:  Radene Ou  MD notified (1st page):  ON-CALL  Time of first page:  0526  MD notified (2nd page):  Time of second page:  Responding MD:  ON-CALL  Time MD responded:  N/A

## 2014-12-16 NOTE — Progress Notes (Signed)
Patient ID: [name], female   DOB: November 28, 1975, 39 y.o.   MRN: [id number]  [facility] Progress Note   Assessment/ Plan:   1. 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 Continue hemodialysis on a Monday/Wednesday/Friday schedule. Next HD likely Monday - will need to follow up vascular surgery plans. Underwent bilateral central venogram 9/16. Access planning underway. Appreciate vascular surgery's help.  2. Hypertension: BP 125/77-148/75. Continue Norvasc 10 mg daily, Hydralazine 50 mg Q8H, and Toprol-XL 100 mg daily.  3. Anemia: Acute drop in Hgb likely 2/2 bleeding from dialysis catheter site. Hgb 6.9 from 7.4 this AM. Transfusing 2u pRBC today. Sat >25, no need for IV iron. 4. Klebsiella UTI: Completed 5 day course of Rocephin/Keflex.  5. Metabolic acidosis: Resolved. Secondary to acute renal failure/chronic kidney disease and improved with hemodialysis.  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 PTH level 71. Will need to have PTH monitoring, check in 3 months.   Subjective:   No acute events. Dressing for HD cath was dry per RN this morning. Fatigued but no new complaints.   Objective:   BP 125/77 mmHg  Pulse 100  Temp(Src) 98.3 F (36.8 C) (Oral)  Resp 18  Ht 5\' 3"  (1.6 m)  Wt 238 lb 1.6 oz (108 kg)  BMI 42.19 kg/m2  SpO2 100%  LMP 11/17/2014 (Approximate)  Intake/Output Summary (Last 24 hours) at 12/16/14 0921 Last data filed at 12/15/14 1300  Gross per 24 hour  Intake    360 ml  Output      0 ml  Net    360 ml   Weight change: -1 lb 1.6 oz (-0.5 kg)  Physical Exam: Gen: alert, sitting up in bed, pleasant, NAD  CVS: RRR, no m/g/r Resp: CTA bilaterally, breaths non-labored Abd: BS+, soft, obese, non-tender Ext: trace LE edema. Right femoral tunneled dialysis  catheter  Imaging: No results found.  Labs: BMET  Recent Labs Lab 12/10/14 0621 12/11/14 1010 12/12/14 0900 12/13/14 0506 12/14/14 0405 12/15/14 0440 12/16/14 0457  NA 135 135 132* 134* 133* 133* 136  K 4.6 4.5 5.0 4.5 4.7 4.4 4.5  CL 101 99* 96* 97* 96* 96* 101  CO2 21* 24 21* 27 25 26 25   GLUCOSE 150* 143* 133* 128* 152* 124* 138*  BUN 106* 65* 88* 56* 80* 50* 68*  CREATININE 5.78* 4.82* 5.98* 4.43* 5.57* 4.01* 5.75*  CALCIUM 8.4* 8.6* 8.7* 8.4* 8.5* 8.1* 8.4*  PHOS 9.6*  --  9.4* 7.0*  --  6.4* 6.5*   CBC  Recent Labs Lab 12/10/14 0622 12/11/14 1010  12/13/14 0506 12/13/14 1650 12/14/14 0405 12/15/14 0440 12/15/14 2030 12/16/14 0457  WBC 13.2* 12.2*  < > 12.3* 14.2* 12.5* 10.2  --  9.0  NEUTROABS 12.4* 11.6*  --  11.9*  --   --   --   --  8.7*  HGB 9.1* 8.1*  < > 6.7* 8.5* 8.5* 7.4* 7.1* 6.9*  HCT 25.4* 24.0*  < > 19.9* 24.8* 24.4* 22.0* 21.0* 20.3*  MCV 83.8 85.4  < > 86.9 86.4 86.5 87.6  --  88.3  PLT 114* 130*  < > 134* 146* 140* 123*  --  139*  < > = values in this interval not displayed.  Medications:    . sodium chloride   Intravenous Once  . sodium chloride  Intravenous Once  . sodium chloride   Intravenous Once  . amLODipine  10 mg Oral Daily  . atorvastatin  20 mg Oral q1800  . calcium acetate  1,334 mg Oral TID WC  . cyclobenzaprine  10 mg Oral QHS  . [START ON 12/17/2014] darbepoetin (ARANESP) injection - DIALYSIS  100 mcg Intravenous Q Mon-HD  . famotidine  40 mg Oral Daily  . feeding supplement  1 Container Oral BID BM  . folic acid  1 mg Oral Daily  . hydrALAZINE  50 mg Oral 3 times per day  . isosorbide mononitrate  30 mg Oral Daily  . metoprolol succinate  100 mg Oral Daily  . mycophenolate  1,500 mg Oral BID  . nystatin   Topical TID  . predniSONE  20 mg Oral TID   Jacques Earthly, MD  Internal Medicine PGY-2  12/16/2014, 9:21 AM

## 2014-12-17 LAB — RENAL FUNCTION PANEL
ALBUMIN: 3 g/dL — AB (ref 3.5–5.0)
ALBUMIN: 2.8 g/dL — AB (ref 3.5–5.0)
ANION GAP: 13 (ref 5–15)
ANION GAP: 14 (ref 5–15)
BUN: 87 mg/dL — AB (ref 6–20)
BUN: 98 mg/dL — ABNORMAL HIGH (ref 6–20)
CALCIUM: 8.5 mg/dL — AB (ref 8.9–10.3)
CHLORIDE: 96 mmol/L — AB (ref 101–111)
CO2: 23 mmol/L (ref 22–32)
CO2: 22 mmol/L (ref 22–32)
Calcium: 8.4 mg/dL — ABNORMAL LOW (ref 8.9–10.3)
Chloride: 101 mmol/L (ref 101–111)
Creatinine, Ser: 6.64 mg/dL — ABNORMAL HIGH (ref 0.44–1.00)
Creatinine, Ser: 6.75 mg/dL — ABNORMAL HIGH (ref 0.44–1.00)
GFR calc non Af Amer: 7 mL/min — ABNORMAL LOW (ref >60)
GFR, EST AFRICAN AMERICAN: 8 mL/min — AB (ref >60)
GFR, EST AFRICAN AMERICAN: 8 mL/min — AB (ref >60)
GFR, EST NON AFRICAN AMERICAN: 7 mL/min — AB (ref >60)
Glucose, Bld: 137 mg/dL — ABNORMAL HIGH (ref 65–99)
Glucose, Bld: 133 mg/dL — ABNORMAL HIGH (ref 65–99)
PHOSPHORUS: 6.5 mg/dL — AB (ref 2.5–4.6)
PHOSPHORUS: 6.7 mg/dL — AB (ref 2.5–4.6)
POTASSIUM: 4.5 mmol/L (ref 3.5–5.1)
Potassium: 4.8 mmol/L (ref 3.5–5.1)
SODIUM: 137 mmol/L (ref 135–145)
Sodium: 132 mmol/L — ABNORMAL LOW (ref 135–145)

## 2014-12-17 LAB — TYPE AND SCREEN
ABO/RH(D): A POS
Antibody Screen: NEGATIVE
UNIT DIVISION: 0
Unit division: 0
Unit division: 0

## 2014-12-17 LAB — CBC
HEMATOCRIT: 27.4 % — AB (ref 36.0–46.0)
HEMOGLOBIN: 9.5 g/dL — AB (ref 12.0–15.0)
MCH: 30.3 pg (ref 26.0–34.0)
MCHC: 34.7 g/dL (ref 30.0–36.0)
MCV: 87.3 fL (ref 78.0–100.0)
Platelets: 154 10*3/uL (ref 150–400)
RBC: 3.14 MIL/uL — AB (ref 3.87–5.11)
RDW: 13.6 % (ref 11.5–15.5)
WBC: 7.2 10*3/uL (ref 4.0–10.5)

## 2014-12-17 MED ORDER — DARBEPOETIN ALFA 100 MCG/0.5ML IJ SOSY
PREFILLED_SYRINGE | INTRAMUSCULAR | Status: AC
  Filled 2014-12-17: qty 0.5

## 2014-12-17 MED ORDER — CEFUROXIME SODIUM 1.5 G IJ SOLR
1.5000 g | INTRAMUSCULAR | Status: AC
  Administered 2014-12-18: 1.5 g via INTRAVENOUS
  Filled 2014-12-17 (×2): qty 1.5

## 2014-12-17 NOTE — Progress Notes (Signed)
Patient ID: [name], female   DOB: 1975-12-19, 39 y.o.   MRN: [id number]  [facility] Progress Note   Assessment/ Plan:   1. 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 Continue hemodialysis on a Monday/Wednesday/Friday schedule. Per vascular surgery, patient is not a candidate for a fistula. Could be candidate for HeRO graft vs hybrid graft. To discuss with vascular surgery and plan for surgery tomorrow, appreciate their help. Once has long-term access can undergo CLIP process.  2. Hypertension: BP [id number] systolic. Continue Norvasc 10 mg daily, Hydralazine 50 mg Q8H, and Toprol-XL 100 mg daily.  3. Anemia: Acute drop in Hgb yesterday likely 2/2 bleeding from dialysis catheter site. Patient received 2 units PRBCs, post-transfusion Hgb 9.8. Continue to monitor, bleeding seems to have improved from cath.  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 PTH level 71. Will need to have PTH monitoring, check in 3 months.   Subjective:   No acute events overnight. Patient reports minimal "oozing" from catheter site. She notes feeling tired, but otherwise feeling well. Concerned about long-term HD access as she is not a candidate for a fistula.    Objective:   BP 158/81 mmHg  Pulse 104  Temp(Src) 98.4 F (36.9 C) (Oral)  Resp 18  Ht 5\' 3"  (1.6 m)  Wt 238 lb 1.6 oz (108 kg)  BMI 42.19 kg/m2  SpO2 97%  LMP 11/17/2014 (Approximate)  Intake/Output Summary (Last 24 hours) at 12/17/14 0748 Last data filed at 12/16/14 1930  Gross per 24 hour  Intake    753 ml  Output      0 ml  Net    753 ml   Weight change:   Physical Exam: Gen: alert, sitting up in bed, pleasant, NAD  CVS: RRR, no m/g/r Resp: CTA bilaterally, breaths non-labored Abd: BS+, soft, obese, non-tender Ext: trace LE edema.  Right femoral tunneled dialysis catheter  Imaging: No results found.  Labs: BMET  Recent Labs Lab 12/11/14 1010 12/12/14 0900 12/13/14 0506 12/14/14 0405 12/15/14 0440 12/16/14 0457 12/17/14 0418  NA 135 132* 134* 133* 133* 136 132*  K 4.5 5.0 4.5 4.7 4.4 4.5 4.5  CL 99* 96* 97* 96* 96* 101 96*  CO2 24 21* 27 25 26 25 23   GLUCOSE 143* 133* 128* 152* 124* 138* 137*  BUN 65* 88* 56* 80* 50* 68* 87*  CREATININE 4.82* 5.98* 4.43* 5.57* 4.01* 5.75* 6.64*  CALCIUM 8.6* 8.7* 8.4* 8.5* 8.1* 8.4* 8.4*  PHOS  --  9.4* 7.0*  --  6.4* 6.5* 6.5*   CBC  Recent Labs Lab 12/11/14 1010  12/13/14 0506  12/14/14 0405 12/15/14 0440 12/15/14 2030 12/16/14 0457 12/16/14 1936  WBC 12.2*  < > 12.3*  < > 12.5* 10.2  --  9.0 8.9  NEUTROABS 11.6*  --  11.9*  --   --   --   --  8.7*  --   HGB 8.1*  < > 6.7*  < > 8.5* 7.4* 7.1* 6.9* 9.8*  HCT 24.0*  < > 19.9*  < > 24.4* 22.0* 21.0* 20.3* 28.9*  MCV 85.4  < > 86.9  < > 86.5 87.6  --  88.3 87.8  PLT 130*  < > 134*  < > 140* 123*  --  139* 146*  < > = values in this interval not displayed.  Medications:    .  sodium chloride   Intravenous Once  . sodium chloride   Intravenous Once  . amLODipine  10 mg Oral Daily  . atorvastatin  20 mg Oral q1800  . calcium acetate  1,334 mg Oral TID WC  . cyclobenzaprine  10 mg Oral QHS  . darbepoetin (ARANESP) injection - DIALYSIS  100 mcg Intravenous Q Mon-HD  . famotidine  40 mg Oral Daily  . feeding supplement  1 Container Oral BID BM  . folic acid  1 mg Oral Daily  . hydrALAZINE  50 mg Oral 3 times per day  . isosorbide mononitrate  30 mg Oral Daily  . metoprolol succinate  100 mg Oral Daily  . mycophenolate  1,500 mg Oral BID  . nystatin   Topical TID  . predniSONE  20 mg Oral TID   [name], MD, MPH Internal Medicine PGY-2  12/17/2014, 7:48 AM

## 2014-12-17 NOTE — Progress Notes (Signed)
Subjective: Patient feeling tired on dialysis but otherwise no new complaints. R femoral cath site clean and dry. She did not notice any significant bleeding overnight. She spoke with vascular surgery and expects surgery tomorrow for access placement.  Objective: Vital signs in last 24 hours: Filed Vitals:   12/17/14 1200 12/17/14 1226 12/17/14 1307 12/17/14 1425  BP: 158/97 155/95 190/95 157/98  Pulse: 106 95 97 100  Temp:  98.3 F (36.8 C) 97.6 F (36.4 C) 98.6 F (37 C)  TempSrc:  Oral Oral Oral  Resp:  18 16 16   Height:      Weight:  102.9 kg (226 lb 13.7 oz)    SpO2:  100% 100% 100%   Weight change:   Intake/Output Summary (Last 24 hours) at 12/17/14 1627 Last data filed at 12/17/14 1504  Gross per 24 hour  Intake    240 ml  Output   3582 ml  Net  -3342 ml   GENERAL- alert, orientedx3, NAD HEENT- Normocephalic, moist oral mucosa CARDIAC- RRR, no murmurs, rubs or gallops. RESP- CTAB, no wheezes or crackles. ABDOMEN- Nontender, not distended EXTREMITIES- No pedal edema, dry dressings intact around R fem catheter SKIN- Warm, dry, ecchymoses around LIJ incision site, dressing at R femoral HD cath dry [facility]- Appropriate, pleasant  Lab Results: Basic Metabolic Panel:  Recent Labs Lab 12/17/14 0418 12/17/14 0902  NA 132* 137  K 4.5 4.8  CL 96* 101  CO2 23 22  GLUCOSE 137* 133*  BUN 87* 98*  CREATININE 6.64* 6.75*  CALCIUM 8.4* 8.5*  PHOS 6.5* 6.7*   Liver Function Tests:  Recent Labs Lab 12/17/14 0418 12/17/14 0902  ALBUMIN 3.0* 2.8*   No results for input(s): LIPASE, AMYLASE in the last 168 hours. No results for input(s): AMMONIA in the last 168 hours. CBC:  Recent Labs Lab 12/13/14 0506  12/16/14 0457 12/16/14 1936 12/17/14 0903  WBC 12.3*  < > 9.0 8.9 7.2  NEUTROABS 11.9*  --  8.7*  --   --   HGB 6.7*  < > 6.9* 9.8* 9.5*  HCT 19.9*  < > 20.3* 28.9* 27.4*  MCV 86.9  < > 88.3 87.8 87.3  PLT 134*  < > 139* 146* 154  < > = values in this  interval not displayed. Cardiac Enzymes: No results for input(s): CKTOTAL, CKMB, CKMBINDEX, TROPONINI in the last 168 hours. BNP: No results for input(s): PROBNP in the last 168 hours. D-Dimer: No results for input(s): DDIMER in the last 168 hours. CBG: No results for input(s): GLUCAP in the last 168 hours. Hemoglobin A1C: No results for input(s): HGBA1C in the last 168 hours. Fasting Lipid Panel: No results for input(s): CHOL, HDL, LDLCALC, TRIG, CHOLHDL, LDLDIRECT in the last 168 hours. Thyroid Function Tests: No results for input(s): TSH, T4TOTAL, FREET4, T3FREE, THYROIDAB in the last 168 hours. Coagulation:  Recent Labs Lab 12/14/14 0405  LABPROT 13.2  INR 0.98   Anemia Panel:  Recent Labs Lab 12/15/14 1050  VITAMINB12 480  FOLATE 4.2*  FERRITIN 139  TIBC 238*  IRON 89  RETICCTPCT 1.7   Urine Drug Screen: Drugs of Abuse     Component Value Date/Time   LABOPIA NONE DETECTED 07/21/2012 0215   COCAINSCRNUR NONE DETECTED 07/21/2012 0215   LABBENZ NONE DETECTED 07/21/2012 0215   AMPHETMU NONE DETECTED 07/21/2012 0215   THCU NONE DETECTED 07/21/2012 0215   LABBARB NONE DETECTED 07/21/2012 0215    Alcohol Level: No results for input(s): ETH in the last  168 hours. Urinalysis: No results for input(s): COLORURINE, LABSPEC, PHURINE, GLUCOSEU, HGBUR, BILIRUBINUR, KETONESUR, PROTEINUR, UROBILINOGEN, NITRITE, LEUKOCYTESUR in the last 168 hours.  Invalid input(s): APPERANCEUR   Micro Results: No results found for this or any previous visit (from the past 240 hour(s)). Studies/Results: No results found. Medications: I have reviewed the patient's current medications. Scheduled Meds: . sodium chloride   Intravenous Once  . sodium chloride   Intravenous Once  . amLODipine  10 mg Oral Daily  . atorvastatin  20 mg Oral q1800  . calcium acetate  1,334 mg Oral TID WC  . [START ON 12/18/2014] cefUROXime (ZINACEF)  IV  1.5 g Intravenous 30 min Pre-Op  . cyclobenzaprine  10  mg Oral QHS  . darbepoetin (ARANESP) injection - DIALYSIS  100 mcg Intravenous Q Mon-HD  . famotidine  40 mg Oral Daily  . folic acid  1 mg Oral Daily  . hydrALAZINE  50 mg Oral 3 times per day  . isosorbide mononitrate  30 mg Oral Daily  . metoprolol succinate  100 mg Oral Daily  . mycophenolate  1,500 mg Oral BID  . nystatin   Topical TID  . 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 R femoral cath site not bleeding today. Vascular plans for LUE fistula placement most likely, probably tomorrow- continue HD per nephro. MWF schedule - Continue prednisone 20mg  PO TID - Continue Cellcept 1500mg  PO BID - Continue zofran 4mg  ODT q6hrs PRN - Continue PhosLo 1334mg  TIDWM - Follow daily renal function panel, CBC - Nephrology following pt, recs appreciated - NPO at midnight  Anemia: 2/2 to renal impairment + acute blood loss. AOCD baseline 10.9 on admission. Hgb improved to 9.5 today, and transfused 2 units prbcs yesterday. - Supplement folic acid - Continue transfuse PRN to Hgb > 7 - Follow qAM CBCs  HTN: BP markedly improved since addition of Imdur. - Continue Amlodipine 10mg  PO - Continue Metoprolol succinate 100mg  PO daily - Continue Hydralazine 50mg  PO TID - Continue Imdur 30mg  PO  Candidal rash: Along waistline under abdominal pannus. On visual inspection, rash is gone. Still pruritic to patient. May discontinue topical treatment tmrw - Nystatin topical TID  Thrombocytopenia and qualitative platelet dysfunction  Will transfuse as needed and manage with pressure dressing. Pt already received 2x doses DDAVP. - No heparin  Back and flank pain: Pain present since renal biopsy. - Percocet 5-325mg  q6hrs PRN - Flexeril 10mg  qHS PRN  SVT: s/p ablation 2014, has some  recurrent palpitations in recent months, currently holter monitor on board. HLD: Atorvastatin 20mg  PO Hyperkalemia: Resolved  FEN: Renal diet, fluid restricted DVT ppx: SCDs FULL CODE  Dispo: Disposition deferred until improvement in clinical condition. Patient will likely be ready for discharge depending on CLIP process and good recovery from access placement.  The patient does have a current PCP Lin Landsman, MD) and does not need an [facility] hospital follow-up appointment after discharge.  The patient does not have transportation limitations that hinder transportation to clinic appointments.   LOS: 14 days   Collier Salina, MD 12/17/2014, 4:27 PM

## 2014-12-17 NOTE — Procedures (Signed)
I have personally attended this patient's dialysis session.   Access is right femoral TDC - no longer having issues with bleeding from the site. Running at 400 BFR UF goal 3-4 liters For 4 hour treatment today Pre-HD labs are still pending CBC from last evening Hb up to 9.6 post transfusion yesterday   Jamal Maes, MD [place] Pager 12/17/2014, 8:53 AM

## 2014-12-17 NOTE — Progress Notes (Signed)
Nutrition Follow-up  DOCUMENTATION CODES:   Morbid obesity  INTERVENTION:   -D/c Boost Breeze, due to poor acceptance  -Continue between meal nourishments  NUTRITION DIAGNOSIS:   Increased nutrient needs related to chronic illness as evidenced by estimated needs.  Progressing  GOAL:   Patient will meet greater than or equal to 90% of their needs  Progressing  MONITOR:   PO intake, Supplement acceptance, Weight trends, Labs, I & O's  REASON FOR ASSESSMENT:   Malnutrition Screening Tool    ASSESSMENT:   39 y/o female with PMH of SVT s/p ablation, anemia of chronic disease, HTN, SLE with recently diagnosed lupus nephritis who p/w dysuria * 2 days. Patient states that she recently underwent a renal biopsy on 11/22/14 and was diagnosed with lupus nephritis.  Pt out of room at time of visit.   Intake has improved since last visit. Noted 25-100% meal completion. Pt is refusing Boost Breeze supplement. Will d/c due to poor acceptance and improved meal intake.   Reviewed nephrology notes, which reveals that bleeding from rt femoral catheter has resolved. Pt awaiting CLIP and outpatient HD placement.   Labs reviewed: elevated BUN/Creat, low GRF, Phos: 6.7 (on binders).   Diet Order:  Diet renal with fluid restriction Fluid restriction:: 1200 mL Fluid; Room service appropriate?: Yes; Fluid consistency:: Thin Diet NPO time specified Except for: Sips with Meds  Skin:  Reviewed, no issues  Last BM:  12/17/14  Height:   Ht Readings from Last 1 Encounters:  12/03/14 5\' 3"  (1.6 m)    Weight:   Wt Readings from Last 1 Encounters:  12/17/14 226 lb 13.7 oz (102.9 kg)    Ideal Body Weight:  52.27 kg  BMI:  Body mass index is 40.2 kg/(m^2).  Estimated Nutritional Needs:   Kcal:  0762-2633  Protein:  110-125 grams  Fluid:  1.2 L/day  EDUCATION NEEDS:   No education needs identified at this time  Jenifer A. Jimmye Norman, RD, LDN, CDE Pager: [phone] After hours  Pager: [phone]

## 2014-12-18 ENCOUNTER — Inpatient Hospital Stay (HOSPITAL_COMMUNITY): Payer: Medicaid Other | Admitting: Certified Registered Nurse Anesthetist

## 2014-12-18 ENCOUNTER — Encounter (HOSPITAL_COMMUNITY): Payer: Self-pay | Admitting: Anesthesiology

## 2014-12-18 ENCOUNTER — Inpatient Hospital Stay (HOSPITAL_COMMUNITY): Payer: Medicaid Other

## 2014-12-18 ENCOUNTER — Encounter (HOSPITAL_COMMUNITY)
Admission: EM | Disposition: A | Discharge status: Rehabilitation Facility | Payer: Self-pay | Source: Home / Self Care | Attending: Internal Medicine

## 2014-12-18 DIAGNOSIS — B3789 Other sites of candidiasis: Secondary | ICD-10-CM

## 2014-12-18 HISTORY — PX: INSERTION HYBRID ANTERIOVENOUS GORTEX GRAFT: SHX5557

## 2014-12-18 LAB — RENAL FUNCTION PANEL
ANION GAP: 10 (ref 5–15)
Albumin: 2.9 g/dL — ABNORMAL LOW (ref 3.5–5.0)
BUN: 56 mg/dL — ABNORMAL HIGH (ref 6–20)
CHLORIDE: 98 mmol/L — AB (ref 101–111)
CO2: 27 mmol/L (ref 22–32)
Calcium: 8.2 mg/dL — ABNORMAL LOW (ref 8.9–10.3)
Creatinine, Ser: 4.65 mg/dL — ABNORMAL HIGH (ref 0.44–1.00)
GFR calc non Af Amer: 11 mL/min — ABNORMAL LOW (ref >60)
GFR, EST AFRICAN AMERICAN: 13 mL/min — AB (ref >60)
Glucose, Bld: 136 mg/dL — ABNORMAL HIGH (ref 65–99)
Phosphorus: 5.6 mg/dL — ABNORMAL HIGH (ref 2.5–4.6)
Potassium: 4.3 mmol/L (ref 3.5–5.1)
Sodium: 135 mmol/L (ref 135–145)

## 2014-12-18 LAB — CBC
HCT: 25.6 % — ABNORMAL LOW (ref 36.0–46.0)
HEMOGLOBIN: 8.8 g/dL — AB (ref 12.0–15.0)
MCH: 30.2 pg (ref 26.0–34.0)
MCHC: 34.4 g/dL (ref 30.0–36.0)
MCV: 88 fL (ref 78.0–100.0)
Platelets: 110 10*3/uL — ABNORMAL LOW (ref 150–400)
RBC: 2.91 MIL/uL — AB (ref 3.87–5.11)
RDW: 13.5 % (ref 11.5–15.5)
WBC: 5.8 10*3/uL (ref 4.0–10.5)

## 2014-12-18 LAB — SURGICAL PCR SCREEN
MRSA, PCR: NEGATIVE
Staphylococcus aureus: NEGATIVE

## 2014-12-18 SURGERY — INSERTION HYBRID ANTERIOVENOUS GORTEX GRAFT
Anesthesia: General | Laterality: Left

## 2014-12-18 MED ORDER — HEMOSTATIC AGENTS (NO CHARGE) OPTIME
TOPICAL | Status: DC | PRN
  Administered 2014-12-18 (×2): 1 via TOPICAL

## 2014-12-18 MED ORDER — FENTANYL CITRATE (PF) 250 MCG/5ML IJ SOLN
INTRAMUSCULAR | Status: AC
  Filled 2014-12-18: qty 5

## 2014-12-18 MED ORDER — SODIUM CHLORIDE 0.9 % IV SOLN
INTRAVENOUS | Status: DC | PRN
  Administered 2014-12-18: 14:00:00

## 2014-12-18 MED ORDER — PROTAMINE SULFATE 10 MG/ML IV SOLN
INTRAVENOUS | Status: AC
  Filled 2014-12-18: qty 5

## 2014-12-18 MED ORDER — ONDANSETRON HCL 4 MG/2ML IJ SOLN
INTRAMUSCULAR | Status: AC
  Filled 2014-12-18: qty 2

## 2014-12-18 MED ORDER — LIDOCAINE HCL (CARDIAC) 20 MG/ML IV SOLN
INTRAVENOUS | Status: DC | PRN
  Administered 2014-12-18: 80 mg via INTRAVENOUS

## 2014-12-18 MED ORDER — PROPOFOL 10 MG/ML IV BOLUS
INTRAVENOUS | Status: AC
  Filled 2014-12-18: qty 20

## 2014-12-18 MED ORDER — 0.9 % SODIUM CHLORIDE (POUR BTL) OPTIME
TOPICAL | Status: DC | PRN
Start: 2014-12-18 — End: 2014-12-18
  Administered 2014-12-18: 1000 mL

## 2014-12-18 MED ORDER — HEPARIN SODIUM (PORCINE) 1000 UNIT/ML IJ SOLN
INTRAMUSCULAR | Status: DC | PRN
  Administered 2014-12-18: 10000 [IU] via INTRAVENOUS

## 2014-12-18 MED ORDER — PROMETHAZINE HCL 25 MG/ML IJ SOLN: 6.2500 mg | INTRAMUSCULAR | Status: DC | PRN

## 2014-12-18 MED ORDER — LIDOCAINE-EPINEPHRINE (PF) 1 %-1:200000 IJ SOLN
INTRAMUSCULAR | Status: AC
  Filled 2014-12-18: qty 30

## 2014-12-18 MED ORDER — PROPOFOL 10 MG/ML IV BOLUS
INTRAVENOUS | Status: DC | PRN
  Administered 2014-12-18: 170 mg via INTRAVENOUS

## 2014-12-18 MED ORDER — IODIXANOL 320 MG/ML IV SOLN
INTRAVENOUS | Status: DC | PRN
  Administered 2014-12-18: 3.5 mL via INTRAVENOUS

## 2014-12-18 MED ORDER — SODIUM CHLORIDE 0.9 % IV SOLN
INTRAVENOUS | Status: DC
  Administered 2014-12-18 – 2014-12-24 (×2): via INTRAVENOUS
  Administered 2014-12-26: 20 mL/h via INTRAVENOUS
  Administered 2015-01-04: 12:00:00 via INTRAVENOUS

## 2014-12-18 MED ORDER — MIDAZOLAM HCL 5 MG/5ML IJ SOLN
INTRAMUSCULAR | Status: DC | PRN
  Administered 2014-12-18: 1 mg via INTRAVENOUS

## 2014-12-18 MED ORDER — CEFAZOLIN SODIUM 1-5 GM-% IV SOLN: 1.0000 g | INTRAVENOUS | Status: AC

## 2014-12-18 MED ORDER — PROTAMINE SULFATE 10 MG/ML IV SOLN
INTRAVENOUS | Status: DC | PRN
  Administered 2014-12-18: 50 mg via INTRAVENOUS

## 2014-12-18 MED ORDER — MIDAZOLAM HCL 2 MG/2ML IJ SOLN
INTRAMUSCULAR | Status: AC
  Filled 2014-12-18: qty 4

## 2014-12-18 MED ORDER — HEPARIN SODIUM (PORCINE) 1000 UNIT/ML IJ SOLN
INTRAMUSCULAR | Status: AC
  Filled 2014-12-18: qty 1

## 2014-12-18 MED ORDER — MORPHINE SULFATE (PF) 2 MG/ML IV SOLN
2.0000 mg | INTRAVENOUS | Status: DC | PRN
  Administered 2014-12-18 – 2015-01-04 (×2): 2 mg via INTRAVENOUS
  Filled 2014-12-18 (×2): qty 1

## 2014-12-18 MED ORDER — FENTANYL CITRATE (PF) 100 MCG/2ML IJ SOLN
INTRAMUSCULAR | Status: DC | PRN
Start: 2014-12-18 — End: 2014-12-18
  Administered 2014-12-18: 100 ug via INTRAVENOUS

## 2014-12-18 MED ORDER — ONDANSETRON HCL 4 MG/2ML IJ SOLN
INTRAMUSCULAR | Status: DC | PRN
  Administered 2014-12-18: 4 mg via INTRAVENOUS

## 2014-12-18 MED ORDER — RENA-VITE PO TABS
1.0000 | ORAL_TABLET | Freq: Every day | ORAL | Status: DC
  Administered 2014-12-18 – 2015-01-04 (×18): 1 via ORAL
  Filled 2014-12-18 (×18): qty 1

## 2014-12-18 MED ORDER — LIDOCAINE HCL (CARDIAC) 20 MG/ML IV SOLN
INTRAVENOUS | Status: AC
  Filled 2014-12-18: qty 5

## 2014-12-18 SURGICAL SUPPLY — 55 items
ADH SKN CLS APL DERMABOND .7 (GAUZE/BANDAGES/DRESSINGS) ×1
ARMBAND PINK RESTRICT EXTREMIT (MISCELLANEOUS) ×3 IMPLANT
BALLN MUSTANG 8X100X135 (BALLOONS) ×3
BALLN MUSTANG 8X20X75 (BALLOONS) ×3
BALLOON MUSTANG 8X100X135 (BALLOONS) IMPLANT
BALLOON MUSTANG 8X20X75 (BALLOONS) IMPLANT
CANISTER SUCTION 2500CC (MISCELLANEOUS) ×3 IMPLANT
DERMABOND ADVANCED (GAUZE/BANDAGES/DRESSINGS) ×2
DERMABOND ADVANCED .7 DNX12 (GAUZE/BANDAGES/DRESSINGS) IMPLANT
ELECT REM PT RETURN 9FT ADLT (ELECTROSURGICAL) ×3
ELECTRODE REM PT RTRN 9FT ADLT (ELECTROSURGICAL) ×1 IMPLANT
GAUZE SPONGE 4X4 12PLY STRL (GAUZE/BANDAGES/DRESSINGS) ×1 IMPLANT
GEL ULTRASOUND 20GR AQUASONIC (MISCELLANEOUS) ×1 IMPLANT
GLOVE BIO SURGEON STRL SZ7 (GLOVE) ×2 IMPLANT
GLOVE BIOGEL PI IND STRL 6.5 (GLOVE) IMPLANT
GLOVE BIOGEL PI IND STRL 7.0 (GLOVE) IMPLANT
GLOVE BIOGEL PI IND STRL 7.5 (GLOVE) ×1 IMPLANT
GLOVE BIOGEL PI INDICATOR 6.5 (GLOVE) ×4
GLOVE BIOGEL PI INDICATOR 7.0 (GLOVE) ×2
GLOVE BIOGEL PI INDICATOR 7.5 (GLOVE) ×4
GLOVE SS BIOGEL STRL SZ 6.5 (GLOVE) IMPLANT
GLOVE SS N UNI LF 7.5 STRL (GLOVE) ×5 IMPLANT
GLOVE SUPERSENSE BIOGEL SZ 6.5 (GLOVE) ×2
GLOVE SURG SS PI 7.0 STRL IVOR (GLOVE) ×2 IMPLANT
GOWN STRL REUS W/ TWL LRG LVL3 (GOWN DISPOSABLE) ×2 IMPLANT
GOWN STRL REUS W/ TWL XL LVL3 (GOWN DISPOSABLE) ×1 IMPLANT
GOWN STRL REUS W/TWL LRG LVL3 (GOWN DISPOSABLE) ×9
GOWN STRL REUS W/TWL XL LVL3 (GOWN DISPOSABLE) ×6
GRAFT VASC HYBRID 6X50X8X10 (Graft) ×2 IMPLANT
HEMOSTAT SNOW SURGICEL 2X4 (HEMOSTASIS) ×4 IMPLANT
INTRODUCER COOK 11FR (CATHETERS) IMPLANT
INTRODUCER SET COOK 14FR (MISCELLANEOUS) ×2 IMPLANT
KIT BASIN OR (CUSTOM PROCEDURE TRAY) ×3 IMPLANT
KIT ENCORE 26 ADVANTAGE (KITS) ×3 IMPLANT
KIT ROOM TURNOVER OR (KITS) ×3 IMPLANT
LIQUID BAND (GAUZE/BANDAGES/DRESSINGS) ×3 IMPLANT
NDL HYPO 25GX1X1/2 BEV (NEEDLE) ×1 IMPLANT
NDL PERC 18GX7CM (NEEDLE) ×1 IMPLANT
NEEDLE HYPO 25GX1X1/2 BEV (NEEDLE) IMPLANT
NEEDLE PERC 18GX7CM (NEEDLE) ×3 IMPLANT
NS IRRIG 1000ML POUR BTL (IV SOLUTION) ×3 IMPLANT
PACK CV ACCESS (CUSTOM PROCEDURE TRAY) ×3 IMPLANT
PAD ARMBOARD 7.5X6 YLW CONV (MISCELLANEOUS) ×6 IMPLANT
SET INTRODUCER 12FR PACEMAKER (SHEATH) IMPLANT
SET MICROPUNCTURE 5F STIFF (MISCELLANEOUS) ×2 IMPLANT
SHEATH AVANTI 11CM 8FR (MISCELLANEOUS) ×2 IMPLANT
SUT PROLENE 6 0 BV (SUTURE) ×7 IMPLANT
SUT SILK 2 0 SH (SUTURE) ×2 IMPLANT
SUT VIC AB 3-0 SH 27 (SUTURE) ×6
SUT VIC AB 3-0 SH 27X BRD (SUTURE) ×1 IMPLANT
SYR 20CC LL (SYRINGE) ×3 IMPLANT
SYRINGE 10CC LL (SYRINGE) ×4 IMPLANT
UNDERPAD 30X30 INCONTINENT (UNDERPADS AND DIAPERS) ×3 IMPLANT
WATER STERILE IRR 1000ML POUR (IV SOLUTION) ×1 IMPLANT
WIRE BENTSON .035X145CM (WIRE) ×3 IMPLANT

## 2014-12-18 NOTE — Progress Notes (Signed)
MD paged to clarify NPO orders after surgery. MD stated they will place orders.

## 2014-12-18 NOTE — Progress Notes (Signed)
No pregnancy test noted, Dr Kalman Shan into see the pt and states no need to draw a pregnancy test.

## 2014-12-18 NOTE — Interval H&P Note (Signed)
History and Physical Interval Note:  12/18/2014 12:45 PM  [name]  has presented today for surgery, with the diagnosis of Chronic kidney disease   The various methods of treatment have been discussed with the patient and family. After consideration of risks, benefits and other options for treatment, the patient has consented to  Procedure(s): INSERTION GORE HYBRID ARTERIOVENOUS GRAFT VS HeRO (N/A) as a surgical intervention .  The patient's history has been reviewed, patient examined, no change in status, stable for surgery.  I have reviewed the patient's chart and labs.  Questions were answered to the patient's satisfaction.     Annamarie Major  Plan left arm hybrid graft.  Discussed details with patient  Annamarie Major

## 2014-12-18 NOTE — Anesthesia Procedure Notes (Signed)
Procedure Name: LMA Insertion Date/Time: 12/18/2014 1:35 PM Performed by: Barkley Boards L Pre-anesthesia Checklist: Patient identified, Emergency Drugs available, Suction available, Patient being monitored and Timeout performed Patient Re-evaluated:Patient Re-evaluated prior to inductionOxygen Delivery Method: Circle system utilized Preoxygenation: Pre-oxygenation with 100% oxygen Intubation Type: Combination inhalational/ intravenous induction Ventilation: Mask ventilation without difficulty LMA: LMA inserted LMA Size: 4.0 Number of attempts: 1 Placement Confirmation: positive ETCO2 and breath sounds checked- equal and bilateral Tube secured with: Tape Dental Injury: Teeth and Oropharynx as per pre-operative assessment

## 2014-12-18 NOTE — Progress Notes (Signed)
Called to see pt complaining of pain in left hand.  Pt had hybrid graft placed today.  She complains that her hand feels like it is sitting in the snow.    On exam pt has no left radial doppler at wrist which immediately returns with compression of the graft.  Ischemic steal left hand Ligation of left arm av graft tonight  Ruta Hinds, MD [facility] [place] Office: [phone] Pager: [phone]

## 2014-12-18 NOTE — H&P (View-Only) (Signed)
    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  She is not a candidate for a fistula.  She could be considered for a HeRO graft vs a hybrid graft.  This decision will be made tomorrow after I discuss it further with renal.  Will plan on surgery on Tuesday  Annamarie Major 12/16/2014 4:43 PM --  Filed Vitals:   12/16/14 1625  BP: 139/73  Pulse: 96  Temp: 98 F (36.7 C)  Resp: 18    Intake/Output Summary (Last 24 hours) at 12/16/14 1643 Last data filed at 12/16/14 1610  Gross per 24 hour  Intake    753 ml  Output      0 ml  Net    753 ml     Laboratory CBC    Component Value Date/Time   WBC 9.0 12/16/2014 0457   HGB 6.9* 12/16/2014 0457   HCT 20.3* 12/16/2014 0457   PLT 139* 12/16/2014 0457    BMET    Component Value Date/Time   NA 136 12/16/2014 0457   K 4.5 12/16/2014 0457   CL 101 12/16/2014 0457   CO2 25 12/16/2014 0457   GLUCOSE 138* 12/16/2014 0457   BUN 68* 12/16/2014 0457   CREATININE 5.75* 12/16/2014 0457   CALCIUM 8.4* 12/16/2014 0457   GFRNONAA 9* 12/16/2014 0457   GFRAA 10* 12/16/2014 0457    COAG Lab Results  Component Value Date   INR 0.98 12/14/2014   INR 1.05 12/06/2014   INR 0.96 11/22/2014   No results found for: PTT  Antibiotics Anti-infectives    Start     Dose/Rate Route Frequency Ordered Stop   12/06/14 1545  ceFAZolin (ANCEF) IVPB 2 g/50 mL premix     2 g 100 mL/hr over 30 Minutes Intravenous To Radiology 12/06/14 1543 12/07/14 0859   12/06/14 1200  cephALEXin (KEFLEX) capsule 250 mg     250 mg Oral Every 12 hours 12/06/14 1116 12/10/14 0959   12/05/14 1400  cefTRIAXone (ROCEPHIN) 1 g in dextrose 5 % 50 mL IVPB  Status:  Discontinued     1 g 100 mL/hr over 30 Minutes Intravenous Every 24 hours 12/05/14 1113 12/06/14 1116   12/04/14 2000   ciprofloxacin (CIPRO) tablet 250 mg  Status:  Discontinued     250 mg Oral 2 times daily 12/04/14 1637 12/05/14 1147   12/04/14 1400  cefTRIAXone (ROCEPHIN) 1 g in dextrose 5 % 50 mL IVPB  Status:  Discontinued     1 g 100 mL/hr over 30 Minutes Intravenous Every 24 hours 12/03/14 1736 12/04/14 1637   12/04/14 0000  ciprofloxacin (CIPRO) 250 MG tablet     250 mg Oral 2 times daily 12/04/14 1647     12/03/14 1345  cefTRIAXone (ROCEPHIN) 1 g in dextrose 5 % 50 mL IVPB     1 g 100 mL/hr over 30 Minutes Intravenous  Once 12/03/14 1343 12/03/14 1551       V. Leia Alf, M.D. [facility] Office: [phone] Pager:  [phone]

## 2014-12-18 NOTE — Anesthesia Preprocedure Evaluation (Addendum)
Anesthesia Evaluation  Patient identified by MRN, date of birth, ID band Patient awake    Reviewed: Allergy & Precautions, NPO status , Patient's Chart, lab work & pertinent test results  Airway Mallampati: II  TM Distance: >3 FB Neck ROM: Full    Dental no notable dental hx.    Pulmonary sleep apnea ,    Pulmonary exam normal breath sounds clear to auscultation       Cardiovascular hypertension, Pt. on medications Normal cardiovascular exam Rhythm:Regular Rate:Normal     Neuro/Psych negative neurological ROS  negative psych ROS   GI/Hepatic negative GI ROS, Neg liver ROS,   Endo/Other  Morbid obesity  Renal/GU DialysisRenal diseaseLupus  negative genitourinary   Musculoskeletal negative musculoskeletal ROS (+)   Abdominal   Peds negative pediatric ROS (+)  Hematology  (+) anemia ,   Anesthesia Other Findings   Reproductive/Obstetrics negative OB ROS                            Anesthesia Physical Anesthesia Plan  ASA: III  Anesthesia Plan: General   Post-op Pain Management:    Induction: Intravenous  Airway Management Planned: LMA and Oral ETT  Additional Equipment:   Intra-op Plan:   Post-operative Plan: Extubation in OR  Informed Consent: I have reviewed the patients History and Physical, chart, labs and discussed the procedure including the risks, benefits and alternatives for the proposed anesthesia with the patient or authorized representative who has indicated his/her understanding and acceptance.   Dental advisory given  Plan Discussed with: CRNA and Surgeon  Anesthesia Plan Comments:        Anesthesia Quick Evaluation

## 2014-12-18 NOTE — Progress Notes (Signed)
Patient ID: [name], female   DOB: 10-13-1975, 39 y.o.   MRN: [id number]  [facility] Progress Note   Assessment/ Plan:   1. 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 Continue hemodialysis on a Monday/Wednesday/Friday schedule. Per vascular surgery, patient is not a candidate for a fistula. Unclear vascular surgery's plans but is on schedule for surgery today? Will follow up their note. Continuing prednisone 20 mg TID and Cellcept 1,500 mg BID. CLIP process underway.  2. Hypertension: BP 062B-762G systolic. Continue Norvasc 10 mg daily, Hydralazine 50 mg Q8H, and Toprol-XL 100 mg daily.  3. Anemia: Hgb 8.8 today. Bleeding from catheter has improved. Stable.  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 PTH level 71. Will need to have PTH monitoring, check in 3 months.   Subjective:   No acute events overnight. Patient feeling uneasy because unclear of what vascular surgery's plans are for access placement. She reports feeling tired, but otherwise well.    Objective:   BP 154/80 mmHg  Pulse 89  Temp(Src) 98.1 F (36.7 C) (Oral)  Resp 20  Ht 5\' 3"  (1.6 m)  Wt 227 lb 8.2 oz (103.2 kg)  BMI 40.31 kg/m2  SpO2 99%  LMP 11/17/2014 (Approximate)  Intake/Output Summary (Last 24 hours) at 12/18/14 1210 Last data filed at 12/18/14 0930  Gross per 24 hour  Intake    260 ml  Output   3582 ml  Net  -3322 ml   Weight change:   Physical Exam: Gen: alert, sitting up in bed, pleasant, NAD  CVS: RRR, no m/g/r Resp: CTA bilaterally, breaths non-labored Abd: BS+, soft, obese, non-tender Ext: trace LE edema. Right femoral tunneled dialysis catheter  Imaging: No results found.  Labs: BMET  Recent Labs Lab 12/12/14 0900 12/13/14 0506 12/14/14 0405 12/15/14 0440 12/16/14 0457  12/17/14 0418 12/17/14 0902 12/18/14 0455  NA 132* 134* 133* 133* 136 132* 137 135  K 5.0 4.5 4.7 4.4 4.5 4.5 4.8 4.3  CL 96* 97* 96* 96* 101 96* 101 98*  CO2 21* 27 25 26 25 23 22 27   GLUCOSE 133* 128* 152* 124* 138* 137* 133* 136*  BUN 88* 56* 80* 50* 68* 87* 98* 56*  CREATININE 5.98* 4.43* 5.57* 4.01* 5.75* 6.64* 6.75* 4.65*  CALCIUM 8.7* 8.4* 8.5* 8.1* 8.4* 8.4* 8.5* 8.2*  PHOS 9.4* 7.0*  --  6.4* 6.5* 6.5* 6.7* 5.6*   CBC  Recent Labs Lab 12/13/14 0506  12/16/14 0457 12/16/14 1936 12/17/14 0903 12/18/14 0455  WBC 12.3*  < > 9.0 8.9 7.2 5.8  NEUTROABS 11.9*  --  8.7*  --   --   --   HGB 6.7*  < > 6.9* 9.8* 9.5* 8.8*  HCT 19.9*  < > 20.3* 28.9* 27.4* 25.6*  MCV 86.9  < > 88.3 87.8 87.3 88.0  PLT 134*  < > 139* 146* 154 110*  < > = values in this interval not displayed.  Medications:    . sodium chloride   Intravenous Once  . sodium chloride   Intravenous Once  . amLODipine  10 mg Oral Daily  . atorvastatin  20 mg Oral q1800  . calcium acetate  1,334 mg Oral TID WC  . cefUROXime (ZINACEF)  IV  1.5 g Intravenous To SS-Surg  . cyclobenzaprine  10 mg Oral QHS  . darbepoetin (ARANESP) injection -  DIALYSIS  100 mcg Intravenous Q Mon-HD  . famotidine  40 mg Oral Daily  . folic acid  1 mg Oral Daily  . hydrALAZINE  50 mg Oral 3 times per day  . isosorbide mononitrate  30 mg Oral Daily  . metoprolol succinate  100 mg Oral Daily  . mycophenolate  1,500 mg Oral BID  . nystatin   Topical TID  . predniSONE  20 mg Oral TID   [name], MD, MPH Internal Medicine PGY-2  12/18/2014, 12:10 PM

## 2014-12-18 NOTE — Progress Notes (Addendum)
Subjective: Patient seen and doing well this morning. No bleeding from any sites overnight. Moderately anxious about surgery. Rash is improving, still has not worked much with PT yet. Her mother was present at bedside.  Objective: Vital signs in last 24 hours: Filed Vitals:   12/17/14 2114 12/18/14 0500 12/18/14 0629 12/18/14 1035  BP: 138/78  140/84 154/80  Pulse: 95  96 89  Temp: 98.5 F (36.9 C)  98.1 F (36.7 C)   TempSrc: Oral  Oral   Resp: 18  20   Height:      Weight:  103.2 kg (227 lb 8.2 oz)    SpO2: 98%  99%    Weight change:   Intake/Output Summary (Last 24 hours) at 12/18/14 1345 Last data filed at 12/18/14 1248  Gross per 24 hour  Intake    260 ml  Output      0 ml  Net    260 ml   GENERAL- alert, obese, NAD HEENT- Normocephalic, moist oral mucosa CARDIAC- RRR, no murmurs, rubs or gallops. RESP- CTAB, no wheezes or crackles. ABDOMEN- Nontender, dressing over left lower pannus for rash EXTREMITIES- No pedal edema, dry dressings intact around R fem catheter SKIN- Warm, dry, ecchymoses around LIJ incision site, dressing at R femoral HD cath dry [facility]- Appropriate, pleasant Lab Results:  Basic Metabolic Panel:  Recent Labs Lab 12/17/14 0902 12/18/14 0455  NA 137 135  K 4.8 4.3  CL 101 98*  CO2 22 27  GLUCOSE 133* 136*  BUN 98* 56*  CREATININE 6.75* 4.65*  CALCIUM 8.5* 8.2*  PHOS 6.7* 5.6*   Liver Function Tests:  Recent Labs Lab 12/17/14 0902 12/18/14 0455  ALBUMIN 2.8* 2.9*   No results for input(s): LIPASE, AMYLASE in the last 168 hours. No results for input(s): AMMONIA in the last 168 hours. CBC:  Recent Labs Lab 12/13/14 0506  12/16/14 0457  12/17/14 0903 12/18/14 0455  WBC 12.3*  < > 9.0  < > 7.2 5.8  NEUTROABS 11.9*  --  8.7*  --   --   --   HGB 6.7*  < > 6.9*  < > 9.5* 8.8*  HCT 19.9*  < > 20.3*  < > 27.4* 25.6*  MCV 86.9  < > 88.3  < > 87.3 88.0  PLT 134*  < > 139*  < > 154 110*  < > = values in this interval not  displayed. Cardiac Enzymes: No results for input(s): CKTOTAL, CKMB, CKMBINDEX, TROPONINI in the last 168 hours. BNP: No results for input(s): PROBNP in the last 168 hours. D-Dimer: No results for input(s): DDIMER in the last 168 hours. CBG: No results for input(s): GLUCAP in the last 168 hours. Hemoglobin A1C: No results for input(s): HGBA1C in the last 168 hours. Fasting Lipid Panel: No results for input(s): CHOL, HDL, LDLCALC, TRIG, CHOLHDL, LDLDIRECT in the last 168 hours. Thyroid Function Tests: No results for input(s): TSH, T4TOTAL, FREET4, T3FREE, THYROIDAB in the last 168 hours. Coagulation:  Recent Labs Lab 12/14/14 0405  LABPROT 13.2  INR 0.98   Anemia Panel:  Recent Labs Lab 12/15/14 1050  VITAMINB12 480  FOLATE 4.2*  FERRITIN 139  TIBC 238*  IRON 89  RETICCTPCT 1.7   Urine Drug Screen: Drugs of Abuse     Component Value Date/Time   LABOPIA NONE DETECTED 07/21/2012 0215   COCAINSCRNUR NONE DETECTED 07/21/2012 0215   LABBENZ NONE DETECTED 07/21/2012 0215   AMPHETMU NONE DETECTED 07/21/2012 0215   THCU NONE  DETECTED 07/21/2012 0215   LABBARB NONE DETECTED 07/21/2012 0215    Alcohol Level: No results for input(s): ETH in the last 168 hours. Urinalysis: No results for input(s): COLORURINE, LABSPEC, PHURINE, GLUCOSEU, HGBUR, BILIRUBINUR, KETONESUR, PROTEINUR, UROBILINOGEN, NITRITE, LEUKOCYTESUR in the last 168 hours.  Invalid input(s): APPERANCEUR   Micro Results: Recent Results (from the past 240 hour(s))  Surgical pcr screen     Status: None   Collection Time: 12/18/14  7:56 AM  Result Value Ref Range Status   MRSA, PCR NEGATIVE NEGATIVE Final   Staphylococcus aureus NEGATIVE NEGATIVE Final    Comment:        The Xpert SA Assay (FDA approved for NASAL specimens in patients over 28 years of age), is one component of a comprehensive surveillance program.  Test performance has been validated by [facility] for patients greater than or equal to  61 year old. It is not intended to diagnose infection nor to guide or monitor treatment.    Studies/Results: No results found. Medications: I have reviewed the patient's current medications. Scheduled Meds: . [MAR Hold] amLODipine  10 mg Oral Daily  . [MAR Hold] atorvastatin  20 mg Oral q1800  . [MAR Hold] calcium acetate  1,334 mg Oral TID WC  . [MAR Hold] cyclobenzaprine  10 mg Oral QHS  . [MAR Hold] darbepoetin (ARANESP) injection - DIALYSIS  100 mcg Intravenous Q Mon-HD  . [MAR Hold] famotidine  40 mg Oral Daily  . [MAR Hold] folic acid  1 mg Oral Daily  . [MAR Hold] hydrALAZINE  50 mg Oral 3 times per day  . [MAR Hold] isosorbide mononitrate  30 mg Oral Daily  . [MAR Hold] metoprolol succinate  100 mg Oral Daily  . [MAR Hold] mycophenolate  1,500 mg Oral BID  . [MAR Hold] nystatin   Topical TID  . [MAR Hold] predniSONE  20 mg Oral TID   Continuous Infusions: . 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 R femoral cath site not bleeding today. LUE AVG placement today by vascular surgery. After this she may be ready for discharge and outpatient dialysis later this week. - Continue prednisone 20mg  PO TID - Continue Cellcept 1500mg  PO BID - Continue zofran 4mg  ODT q6hrs PRN - Continue PhosLo 1334mg  TIDWM - Follow daily renal function panel, CBC - Nephrology following pt, recs appreciated  Anemia: 2/2 to renal impairment + acute blood loss. AOCD baseline 10.9 on admission. Hgb stable today. Patient to surgery, may have some postsurgical blood loss. - Supplement folic acid - Continue transfuse PRN to Hgb > 7 - Follow qAM CBCs  HTN: BP markedly improved since addition of Imdur. - Continue Amlodipine 10mg  PO - Continue Metoprolol  succinate 100mg  PO daily - Continue Hydralazine 50mg  PO TID - Continue Imdur 30mg  PO  Candidal rash: Resolving  Thrombocytopenia and qualitative platelet dysfunction  Bleeding has resolved. Pt already received 2x doses DDAVP. - No heparin  Back and flank pain: Pain present since renal biopsy. - Percocet 5-325mg  q6hrs PRN - Flexeril 10mg  qHS PRN  SVT: s/p ablation 2014, has some recurrent palpitations in recent months, currently holter monitor on board. HLD: Atorvastatin 20mg  PO Hyperkalemia: Resolved  FEN: Renal diet, fluid restricted DVT ppx: SCDs FULL CODE  Dispo: Disposition deferred until improvement in clinical condition. Patient may be ready  for discharge in approximately 2 days.  The patient does have a current PCP Lin Landsman, MD) and does not need an [facility] hospital follow-up appointment after discharge.  The patient does not have transportation limitations that hinder transportation to clinic appointments.   LOS: 15 days   Collier Salina, MD 12/18/2014, 1:45 PM --------------------------------------------------------------------------------------------------------  Date: 12/18/2014  Patient name: [name]  Medical record number: [id number]  Date of birth: 10/16/1975   [name] plan of care was discussed with  Dr. Benjamine Mola. Please see their note for complete details. I concur with their findings.   Carlyle Basques, MD 12/18/2014, 4:52 PM

## 2014-12-18 NOTE — Op Note (Signed)
    Patient name: [name] MRN: [id number] DOB: 03-05-1976 Sex: female  12/03/2014 - 12/18/2014 Pre-operative Diagnosis: End-stage renal disease Post-operative diagnosis:  Same Surgeon:  Annamarie Major Assistants:  RNFA Procedure:   #1: Left upper arm hybrid dialysis graft   #2: Stent, left axillary vein   #3: Central venogram Anesthesia:  Gen. Blood Loss:  See anesthesia record Specimens:  None  Findings:  Subclavian vein became adequate for hybrid graft placement.  I utilized a 8 mm stent and a 6 x 15 Gore-Tex graft  Indications:  The patient has renal failure secondary to lupus.  She has had multiple central venous catheterizations for cardiac ablation in the past.  Radiology could not place a catheter from the left IJ.  She has a PICC line in the right.  Therefore after performing a venogram to evaluate her central venous structures, I have elected to proceed with a hybrid graft on the left.  Procedure:  The patient was identified in the holding area and taken to [place] [address]  The patient was then placed supine on the table. general anesthesia was administered.  The patient was prepped and draped in the usual sterile fashion.  A time out was called and antibiotics were administered.   A longitudinal iIncision was made just proximal to the antecubital crease.  Through the incision, the brachial artery was dissected free.  This had a good pulse and was about 3 mm in diameter.  I then made an incision longitudinally up in the axilla.  I dissected out the brachial vein and traced it proximally to the axillary vein.  I then used a curved Gore tunneler to create a tunnel between the 2 incisions.  The patient was fully heparinized Using a micropuncture kit I gained access into the axillary vein and performed a central venogram which showed a patent subclavian vein but diseased axillary vein.  The SVC was also patent.  Next an 035 wire was inserted followed by the peel-away sheath for the hybrid  stent.  A 8 mm stent attached to a 6 mm graft was then inserted over the wire into the subclavian and axillary vein.  The peel-away sheath was then removed.  The stent was situated at the venotomy site.  I then used an 8 x 20 balloon to mold the stent to proper orientation.  Next the graft was brought through the previously created tunnel, making sure to maintain proper orientation.  I then occluded the brachial artery.  A #11 blade was used to make an arteriotomy which was extended longitudinally with Potts scissors.  A end-to-side anastomosis was then created with running 6-0 Prolene.  Prior to completion, the appropriate flushing maneuvers were performed and the anastomosis was completed.  There was a faintly palpable pulse within the graft.  There were monophasic signals in the radial and ulnar artery which did augment with graft compression.  50 mg of protamine was then given.  Once hemostasis was satisfactory the incisions were closed with 2 layers of I followed by Dermabond.  There were no immediate complications.   Disposition:  To PACU in stable condition.   Theotis Burrow, M.D. [facility] [place] Office: [phone] Pager:  [phone]

## 2014-12-18 NOTE — Transfer of Care (Signed)
Immediate Anesthesia Transfer of Care Note  Patient: [name]  Procedure(s) Performed: Procedure(s): INSERTION GORE HYBRID ARTERIOVENOUS GRAFT LEFT AXILLO-BRACHIAL. (Left)  Patient Location: PACU  Anesthesia Type:General  Level of Consciousness: awake, alert , oriented and patient cooperative  Airway & Oxygen Therapy: Patient Spontanous Breathing and Patient connected to nasal cannula oxygen  Post-op Assessment: Report given to RN, Post -op Vital signs reviewed and stable and Patient moving all extremities  Post vital signs: Reviewed and stable  Last Vitals:  Filed Vitals:   12/18/14 1035  BP: 154/80  Pulse: 89  Temp:   Resp:     Complications: No apparent anesthesia complications

## 2014-12-19 ENCOUNTER — Encounter (HOSPITAL_COMMUNITY): Payer: Self-pay | Admitting: Certified Registered"

## 2014-12-19 ENCOUNTER — Inpatient Hospital Stay (HOSPITAL_COMMUNITY): Payer: Medicaid Other | Admitting: Certified Registered"

## 2014-12-19 ENCOUNTER — Encounter (HOSPITAL_COMMUNITY)
Admission: EM | Disposition: A | Discharge status: Rehabilitation Facility | Payer: Self-pay | Source: Home / Self Care | Attending: Internal Medicine

## 2014-12-19 DIAGNOSIS — T82898A Other specified complication of vascular prosthetic devices, implants and grafts, initial encounter: Secondary | ICD-10-CM | POA: Diagnosis not present

## 2014-12-19 HISTORY — PX: LIGATION OF ARTERIOVENOUS  FISTULA: SHX5948

## 2014-12-19 LAB — RENAL FUNCTION PANEL
ANION GAP: 12 (ref 5–15)
Albumin: 2.8 g/dL — ABNORMAL LOW (ref 3.5–5.0)
BUN: 76 mg/dL — ABNORMAL HIGH (ref 6–20)
CALCIUM: 8 mg/dL — AB (ref 8.9–10.3)
CO2: 24 mmol/L (ref 22–32)
Chloride: 98 mmol/L — ABNORMAL LOW (ref 101–111)
Creatinine, Ser: 5.7 mg/dL — ABNORMAL HIGH (ref 0.44–1.00)
GFR calc non Af Amer: 9 mL/min — ABNORMAL LOW (ref >60)
GFR, EST AFRICAN AMERICAN: 10 mL/min — AB (ref >60)
GLUCOSE: 145 mg/dL — AB (ref 65–99)
PHOSPHORUS: 6.6 mg/dL — AB (ref 2.5–4.6)
POTASSIUM: 4.3 mmol/L (ref 3.5–5.1)
SODIUM: 134 mmol/L — AB (ref 135–145)

## 2014-12-19 SURGERY — LIGATION OF ARTERIOVENOUS  FISTULA
Anesthesia: General | Laterality: Left

## 2014-12-19 MED ORDER — SUFENTANIL CITRATE 50 MCG/ML IV SOLN
INTRAVENOUS | Status: AC
  Filled 2014-12-19: qty 1

## 2014-12-19 MED ORDER — OXYCODONE HCL 5 MG PO TABS: 5.0000 mg | ORAL_TABLET | Freq: Once | ORAL | Status: DC | PRN

## 2014-12-19 MED ORDER — SODIUM CHLORIDE 0.9 % IV SOLN
INTRAVENOUS | Status: DC | PRN
  Administered 2014-12-19: 03:00:00 via INTRAVENOUS

## 2014-12-19 MED ORDER — LIDOCAINE HCL (PF) 1 % IJ SOLN
INTRAMUSCULAR | Status: AC
  Filled 2014-12-19: qty 30

## 2014-12-19 MED ORDER — LIDOCAINE HCL (PF) 1 % IJ SOLN
INTRAMUSCULAR | Status: DC | PRN
  Administered 2014-12-19: 15 mL

## 2014-12-19 MED ORDER — MIDAZOLAM HCL 2 MG/2ML IJ SOLN
INTRAMUSCULAR | Status: AC
  Filled 2014-12-19: qty 4

## 2014-12-19 MED ORDER — MIDAZOLAM HCL 2 MG/2ML IJ SOLN
INTRAMUSCULAR | Status: DC | PRN
  Administered 2014-12-19: 2 mg via INTRAVENOUS

## 2014-12-19 MED ORDER — FAMOTIDINE 20 MG PO TABS
20.0000 mg | ORAL_TABLET | Freq: Every day | ORAL | Status: DC
  Administered 2014-12-21 – 2014-12-25 (×5): 20 mg via ORAL
  Filled 2014-12-19 (×5): qty 1

## 2014-12-19 MED ORDER — ONDANSETRON HCL 4 MG/2ML IJ SOLN: 4.0000 mg | Freq: Four times a day (QID) | INTRAMUSCULAR | Status: DC | PRN

## 2014-12-19 MED ORDER — 0.9 % SODIUM CHLORIDE (POUR BTL) OPTIME
TOPICAL | Status: DC | PRN
  Administered 2014-12-19: 1000 mL

## 2014-12-19 MED ORDER — CEFAZOLIN SODIUM-DEXTROSE 2-3 GM-% IV SOLR
INTRAVENOUS | Status: DC | PRN
  Administered 2014-12-19: 2 g via INTRAVENOUS

## 2014-12-19 MED ORDER — PROPOFOL 10 MG/ML IV BOLUS
INTRAVENOUS | Status: DC | PRN
  Administered 2014-12-19: 70 mg via INTRAVENOUS
  Administered 2014-12-19: 50 mg via INTRAVENOUS
  Administered 2014-12-19: 30 mg via INTRAVENOUS

## 2014-12-19 MED ORDER — PROPOFOL 10 MG/ML IV BOLUS
INTRAVENOUS | Status: AC
  Filled 2014-12-19: qty 20

## 2014-12-19 MED ORDER — FENTANYL CITRATE (PF) 100 MCG/2ML IJ SOLN
25.0000 ug | INTRAMUSCULAR | Status: DC | PRN
  Administered 2014-12-19 (×3): 25 ug via INTRAVENOUS

## 2014-12-19 MED ORDER — FENTANYL CITRATE (PF) 100 MCG/2ML IJ SOLN
INTRAMUSCULAR | Status: AC
  Filled 2014-12-19: qty 2

## 2014-12-19 MED ORDER — OXYCODONE HCL 5 MG/5ML PO SOLN: 5.0000 mg | Freq: Once | ORAL | Status: DC | PRN

## 2014-12-19 SURGICAL SUPPLY — 25 items
ADH SKN CLS APL DERMABOND .7 (GAUZE/BANDAGES/DRESSINGS) ×1
CANISTER SUCTION 2500CC (MISCELLANEOUS) ×2 IMPLANT
DERMABOND ADVANCED (GAUZE/BANDAGES/DRESSINGS) ×1
DERMABOND ADVANCED .7 DNX12 (GAUZE/BANDAGES/DRESSINGS) IMPLANT
ELECT REM PT RETURN 9FT ADLT (ELECTROSURGICAL) ×2
ELECTRODE REM PT RTRN 9FT ADLT (ELECTROSURGICAL) ×1 IMPLANT
GEL ULTRASOUND 20GR AQUASONIC (MISCELLANEOUS) ×2 IMPLANT
GLOVE BIO SURGEON STRL SZ7.5 (GLOVE) ×2 IMPLANT
GOWN STRL REUS W/ TWL LRG LVL3 (GOWN DISPOSABLE) ×3 IMPLANT
GOWN STRL REUS W/TWL LRG LVL3 (GOWN DISPOSABLE) ×6
KIT BASIN OR (CUSTOM PROCEDURE TRAY) ×2 IMPLANT
KIT ROOM TURNOVER OR (KITS) ×2 IMPLANT
LIQUID BAND (GAUZE/BANDAGES/DRESSINGS) ×2 IMPLANT
LOOP VESSEL MINI RED (MISCELLANEOUS) IMPLANT
NS IRRIG 1000ML POUR BTL (IV SOLUTION) ×2 IMPLANT
PACK CV ACCESS (CUSTOM PROCEDURE TRAY) ×2 IMPLANT
PAD ARMBOARD 7.5X6 YLW CONV (MISCELLANEOUS) ×4 IMPLANT
SPONGE SURGIFOAM ABS GEL 100 (HEMOSTASIS) IMPLANT
SUT PROLENE 6 0 CC (SUTURE) IMPLANT
SUT SILK 0 (SUTURE) IMPLANT
SUT VIC AB 3-0 SH 27 (SUTURE) ×2
SUT VIC AB 3-0 SH 27X BRD (SUTURE) ×1 IMPLANT
SUT VICRYL 4-0 PS2 18IN ABS (SUTURE) ×2 IMPLANT
UNDERPAD 30X30 INCONTINENT (UNDERPADS AND DIAPERS) ×2 IMPLANT
WATER STERILE IRR 1000ML POUR (IV SOLUTION) ×2 IMPLANT

## 2014-12-19 NOTE — Anesthesia Preprocedure Evaluation (Signed)
Anesthesia Evaluation  Patient identified by MRN, date of birth, ID band Patient awake    Reviewed: Allergy & Precautions, NPO status , Patient's Chart, lab work & pertinent test results  Airway Mallampati: II   Neck ROM: full    Dental   Pulmonary shortness of breath, sleep apnea ,    breath sounds clear to auscultation       Cardiovascular hypertension, + dysrhythmias Supra Ventricular Tachycardia  Rhythm:regular Rate:Normal     Neuro/Psych  Headaches, Anxiety  Neuromuscular disease    GI/Hepatic GERD  ,  Endo/Other  Morbid obesity  Renal/GU ESRF and DialysisRenal disease     Musculoskeletal  (+) Fibromyalgia -  Abdominal   Peds  Hematology   Anesthesia Other Findings   Reproductive/Obstetrics                             Anesthesia Physical Anesthesia Plan  ASA: III  Anesthesia Plan: MAC   Post-op Pain Management:    Induction: Intravenous  Airway Management Planned: Simple Face Mask  Additional Equipment:   Intra-op Plan:   Post-operative Plan:   Informed Consent: I have reviewed the patients History and Physical, chart, labs and discussed the procedure including the risks, benefits and alternatives for the proposed anesthesia with the patient or authorized representative who has indicated his/her understanding and acceptance.     Plan Discussed with: CRNA, Anesthesiologist and Surgeon  Anesthesia Plan Comments:         Anesthesia Quick Evaluation

## 2014-12-19 NOTE — Progress Notes (Signed)
Patient back from procedure.  Positive pulses to the right radial.  Bruising noted to the left arm.  No drainage noted at present time

## 2014-12-19 NOTE — Progress Notes (Signed)
Patient ID: [name], female   DOB: 1975/04/18, 39 y.o.   MRN: [id number]  [facility] Progress Note   Assessment/ Plan:   1. 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 Currently on M/W/F schedule, but now has been clipped and will be on TThS schedule. 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 Taken back to the OR last night and graft was ligated, blood flow re-established. Patient feels strongly about having a graft placed in the RLE. Vascular surgery to see this afternoon to discuss long term access options. Likely to go for HD tomorrow. Continuing prednisone 20 mg TID and Cellcept 1,500 mg BID.  2. Hypertension: BP 412I-786V systolic. Likely higher pressures due to stress from ischemia in left hand. Continue Norvasc 10 mg daily, Hydralazine 50 mg Q8H, and Toprol-XL 100 mg daily.  3. Anemia: Hgb 8.8 yesterday. Bleeding from catheter has improved. Stable.  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 PTH level 71. Will need to have PTH monitoring, check in 3 months.   Subjective:   Patient had to be taken back to the OR last night after found to have an ischemic left hand. Hybrid graft ligated and blood flow re-established. Patient is upset this morning. She was happy to hear news of being clipped for outpatient dialysis. She reports her left hand no longer hurts and has no tingling/numbness. Good pulses.    Objective:   BP 156/80 mmHg  Pulse 91  Temp(Src) 98.2 F (36.8 C) (Oral)  Resp 18  Ht 5\' 3"  (1.6 m)  Wt 229 lb 11.5 oz (104.2 kg)  BMI 40.70 kg/m2  SpO2 97%  LMP 11/17/2014 (Approximate)  Intake/Output Summary (Last 24 hours) at 12/19/14 0910 Last data filed at 12/19/14 0324   Gross per 24 hour  Intake    425 ml  Output     50 ml  Net    375 ml   Weight change: -1 lb 15.8 oz (-0.9 kg)  Physical Exam: Gen: alert, sitting up in bed, pleasant, NAD  CVS: RRR, no m/g/r Resp: CTA bilaterally, breaths non-labored Abd: BS+, soft, obese, non-tender Ext: trace LE edema. Right femoral tunneled dialysis catheter without bleeding. Good distal pulses in upper extremities. Bruising noted in LUE and sutures intact in graft area.   Imaging: No results found.  Labs: BMET  Recent Labs Lab 12/13/14 0506 12/14/14 0405 12/15/14 0440 12/16/14 0457 12/17/14 0418 12/17/14 0902 12/18/14 0455 12/19/14 0541  NA 134* 133* 133* 136 132* 137 135 134*  K 4.5 4.7 4.4 4.5 4.5 4.8 4.3 4.3  CL 97* 96* 96* 101 96* 101 98* 98*  CO2 27 25 26 25 23 22 27 24   GLUCOSE 128* 152* 124* 138* 137* 133* 136* 145*  BUN 56* 80* 50* 68* 87* 98* 56* 76*  CREATININE 4.43* 5.57* 4.01* 5.75* 6.64* 6.75* 4.65* 5.70*  CALCIUM 8.4* 8.5* 8.1* 8.4* 8.4* 8.5* 8.2* 8.0*  PHOS 7.0*  --  6.4* 6.5* 6.5* 6.7* 5.6* 6.6*   CBC  Recent Labs Lab 12/13/14 0506  12/16/14 0457 12/16/14 1936 12/17/14 0903 12/18/14 0455  WBC 12.3*  < > 9.0 8.9 7.2 5.8  NEUTROABS 11.9*  --  8.7*  --   --   --   HGB 6.7*  < > 6.9* 9.8*  9.5* 8.8*  HCT 19.9*  < > 20.3* 28.9* 27.4* 25.6*  MCV 86.9  < > 88.3 87.8 87.3 88.0  PLT 134*  < > 139* 146* 154 110*  < > = values in this interval not displayed.  Medications:    . amLODipine  10 mg Oral Daily  . atorvastatin  20 mg Oral q1800  . calcium acetate  1,334 mg Oral TID WC  .  ceFAZolin (ANCEF) IV  1 g Intravenous To SSTC  . cyclobenzaprine  10 mg Oral QHS  . darbepoetin (ARANESP) injection - DIALYSIS  100 mcg Intravenous Q Mon-HD  . famotidine  40 mg Oral Daily  . fentaNYL      . folic acid  1 mg Oral Daily  . hydrALAZINE  50 mg Oral 3 times per day  . isosorbide mononitrate  30 mg Oral Daily  . metoprolol succinate  100 mg Oral Daily  . multivitamin  1 tablet Oral  QHS  . mycophenolate  1,500 mg Oral BID  . nystatin   Topical TID  . predniSONE  20 mg Oral TID   [name], MD, MPH Internal Medicine PGY-2  12/19/2014, 9:10 AM

## 2014-12-19 NOTE — Progress Notes (Signed)
[facility] Progress Note  Subjective  - POD #1  Left hand feels better. Still having some mild numbness.   Objective Filed Vitals:   12/19/14 1415  BP: 138/74  Pulse: 107  Temp: 98.2 F (36.8 C)  Resp: 16    Intake/Output Summary (Last 24 hours) at 12/19/14 1654 Last data filed at 12/19/14 0324  Gross per 24 hour  Intake    155 ml  Output      0 ml  Net    155 ml    Left antecubital incision clean and intact. Some surrounding ecchymosis. 2-3+ left radial pulse.  Right femoral cath without oozing Easily palpable left DP pulse.   Assessment/Planning: 39 y.o. female is s/p: left upper arm hybrid graft with subsequent ligation of graft secondary to steal symptoms.   Left hand pain resolved. Patient wanting to remain in hospital for access placement.  Dr. Trula Slade to see patient tomorrow to discuss permanent access. Her next access option is a left thigh AV graft.   [name] 12/19/2014 4:54 PM --  Laboratory CBC    Component Value Date/Time   WBC 5.8 12/18/2014 0455   HGB 8.8* 12/18/2014 0455   HCT 25.6* 12/18/2014 0455   PLT 110* 12/18/2014 0455    BMET    Component Value Date/Time   NA 134* 12/19/2014 0541   K 4.3 12/19/2014 0541   CL 98* 12/19/2014 0541   CO2 24 12/19/2014 0541   GLUCOSE 145* 12/19/2014 0541   BUN 76* 12/19/2014 0541   CREATININE 5.70* 12/19/2014 0541   CALCIUM 8.0* 12/19/2014 0541   GFRNONAA 9* 12/19/2014 0541   GFRAA 10* 12/19/2014 0541    COAG Lab Results  Component Value Date   INR 0.98 12/14/2014   INR 1.05 12/06/2014   INR 0.96 11/22/2014   No results found for: PTT  Antibiotics Anti-infectives    Start     Dose/Rate Route Frequency Ordered Stop   12/18/14 2345  ceFAZolin (ANCEF) IVPB 1 g/50 mL premix    Comments:  Send with pt to OR   1 g 100 mL/hr over 30 Minutes Intravenous To Short Stay 12/18/14 2337 12/19/14 2345   12/18/14 1115  cefUROXime (ZINACEF) 1.5 g in dextrose 5 % 50 mL IVPB      1.5 g 100 mL/hr over 30 Minutes Intravenous To ShortStay Surgical 12/17/14 0948 12/18/14 1324   12/06/14 1545  ceFAZolin (ANCEF) IVPB 2 g/50 mL premix     2 g 100 mL/hr over 30 Minutes Intravenous To Radiology 12/06/14 1543 12/07/14 0859   12/06/14 1200  cephALEXin (KEFLEX) capsule 250 mg     250 mg Oral Every 12 hours 12/06/14 1116 12/10/14 0959   12/05/14 1400  cefTRIAXone (ROCEPHIN) 1 g in dextrose 5 % 50 mL IVPB  Status:  Discontinued     1 g 100 mL/hr over 30 Minutes Intravenous Every 24 hours 12/05/14 1113 12/06/14 1116   12/04/14 2000  ciprofloxacin (CIPRO) tablet 250 mg  Status:  Discontinued     250 mg Oral 2 times daily 12/04/14 1637 12/05/14 1147   12/04/14 1400  cefTRIAXone (ROCEPHIN) 1 g in dextrose 5 % 50 mL IVPB  Status:  Discontinued     1 g 100 mL/hr over 30 Minutes Intravenous Every 24 hours 12/03/14 1736 12/04/14 1637   12/04/14 0000  ciprofloxacin (CIPRO) 250 MG tablet     250 mg Oral 2 times daily 12/04/14 1647     12/03/14 1345  cefTRIAXone (ROCEPHIN) 1 g in dextrose 5 % 50 mL IVPB     1 g 100 mL/hr over 30 Minutes Intravenous  Once 12/03/14 1343 12/03/14 [place], PA-C [facility] Office: [phone] Pager: [phone] 12/19/2014 4:54 PM     I agree with the above.  Plan for Thigh AVGG  Wells BRabham

## 2014-12-19 NOTE — Transfer of Care (Signed)
Immediate Anesthesia Transfer of Care Note  Patient: [name]  Procedure(s) Performed: Procedure(s): LIGATION OF FISTULA (Left)  Patient Location: PACU  Anesthesia Type:MAC  Level of Consciousness: awake, alert , oriented and patient cooperative  Airway & Oxygen Therapy: Patient Spontanous Breathing  Post-op Assessment: Report given to RN, Post -op Vital signs reviewed and stable and Patient moving all extremities X 4  Post vital signs: Reviewed and stable  Last Vitals:  Filed Vitals:   12/19/14 0342  BP: 170/82  Pulse: 89  Temp:   Resp:     Complications: No apparent anesthesia complications

## 2014-12-19 NOTE — Progress Notes (Signed)
Patient ID: [name], female   DOB: April 12, 1975, 39 y.o.   MRN: [id number] Pt s/p rt femoral tunneled HD cath placement on 9/9. Per nursing there has been some bleeding at site noted recently. On exam gauze dressing is saturated with old blood; underneath is old clot formation-quarter sized area; area cleansed, clot removed ; no active bleeding appreciated at this time. V pad applied to site as precaution and new dressing will be applied to site. Will plan to reexamine site in am and if stable remove V pad and wing sutures. Dr. Earleen Newport aware of above.

## 2014-12-19 NOTE — Progress Notes (Signed)
Paged Rapid Response regarding pts left arm. LUE is cold to touch and no radial pulse even with doppler.  MD paged and made aware.  Will continue to monitor

## 2014-12-19 NOTE — Anesthesia Procedure Notes (Signed)
Procedure Name: MAC Date/Time: 12/19/2014 2:50 AM Performed by: Claris Che Pre-anesthesia Checklist: Patient identified, Emergency Drugs available, Suction available, Patient being monitored and Timeout performed Patient Re-evaluated:Patient Re-evaluated prior to inductionOxygen Delivery Method: Simple face mask

## 2014-12-19 NOTE — Progress Notes (Signed)
Patient wheeled to short stay via stretcher.  MD marked patients arm patient very tearful at present time.

## 2014-12-19 NOTE — Progress Notes (Signed)
Subjective: Patient went for LUE AVG placement yesterday by vascular surgery. Procedure was complicated by subsequent steal syndrome of rhe left hand. The graft was subsequently ligated in the OR. Today the patient was somewhat tearful and very anxious about her treatment. Her left hand is no longer painful but she does feel slightly weaker in that arm. She is also stating that if at all possible she would want to stay at the hospital until her access need is addressed rather than going home for a short duration. She may or may not be an appropriate candidate for this and will discuss it with vascular surgery later in the day. Her R femoral catheter site was noted to ooze some blood overnight, no bleeding was noted from other surgical sites.  Objective: Vital signs in last 24 hours: Filed Vitals:   12/19/14 0425 12/19/14 0445 12/19/14 0926 12/19/14 1415  BP: 146/85 156/80 152/78 138/74  Pulse: 93 91 78 107  Temp: 97.9 F (36.6 C) 98.2 F (36.8 C)  98.2 F (36.8 C)  TempSrc:  Oral  Oral  Resp: 16 18  16   Height:      Weight:  104.2 kg (229 lb 11.5 oz)    SpO2: 98% 97%  97%   Weight change: -0.9 kg (-1 lb 15.8 oz)  Intake/Output Summary (Last 24 hours) at 12/19/14 1600 Last data filed at 12/19/14 0324  Gross per 24 hour  Intake    175 ml  Output      0 ml  Net    175 ml   GENERAL- alert, orientedx3, NAD HEENT- Normocephalic, moist oral mucosa CARDIAC- RRR, no murmurs, rubs or gallops. RESP- CTAB, no wheezes or crackles. ABDOMEN- Nontender, not distended, scattered small bruises present EXTREMITIES- No pedal edema, dry dressings intact around R fem catheter, ecchymoses around LUE surgical site SKIN- Warm, dry, ecchymoses around LIJ incision site PSYCH- Tearful, anxious  Lab Results: Basic Metabolic Panel:  Recent Labs Lab 12/18/14 0455 12/19/14 0541  NA 135 134*  K 4.3 4.3  CL 98* 98*  CO2 27 24  GLUCOSE 136* 145*  BUN 56* 76*  CREATININE 4.65* 5.70*  CALCIUM 8.2*  8.0*  PHOS 5.6* 6.6*   Liver Function Tests:  Recent Labs Lab 12/18/14 0455 12/19/14 0541  ALBUMIN 2.9* 2.8*   No results for input(s): LIPASE, AMYLASE in the last 168 hours. No results for input(s): AMMONIA in the last 168 hours. CBC:  Recent Labs Lab 12/13/14 0506  12/16/14 0457  12/17/14 0903 12/18/14 0455  WBC 12.3*  < > 9.0  < > 7.2 5.8  NEUTROABS 11.9*  --  8.7*  --   --   --   HGB 6.7*  < > 6.9*  < > 9.5* 8.8*  HCT 19.9*  < > 20.3*  < > 27.4* 25.6*  MCV 86.9  < > 88.3  < > 87.3 88.0  PLT 134*  < > 139*  < > 154 110*  < > = values in this interval not displayed. Cardiac Enzymes: No results for input(s): CKTOTAL, CKMB, CKMBINDEX, TROPONINI in the last 168 hours. BNP: No results for input(s): PROBNP in the last 168 hours. D-Dimer: No results for input(s): DDIMER in the last 168 hours. CBG: No results for input(s): GLUCAP in the last 168 hours. Hemoglobin A1C: No results for input(s): HGBA1C in the last 168 hours. Fasting Lipid Panel: No results for input(s): CHOL, HDL, LDLCALC, TRIG, CHOLHDL, LDLDIRECT in the last 168 hours. Thyroid Function Tests: No  results for input(s): TSH, T4TOTAL, FREET4, T3FREE, THYROIDAB in the last 168 hours. Coagulation:  Recent Labs Lab 12/14/14 0405  LABPROT 13.2  INR 0.98   Anemia Panel:  Recent Labs Lab 12/15/14 1050  VITAMINB12 480  FOLATE 4.2*  FERRITIN 139  TIBC 238*  IRON 89  RETICCTPCT 1.7   Urine Drug Screen: Drugs of Abuse     Component Value Date/Time   LABOPIA NONE DETECTED 07/21/2012 0215   COCAINSCRNUR NONE DETECTED 07/21/2012 0215   LABBENZ NONE DETECTED 07/21/2012 0215   AMPHETMU NONE DETECTED 07/21/2012 0215   THCU NONE DETECTED 07/21/2012 0215   LABBARB NONE DETECTED 07/21/2012 0215    Alcohol Level: No results for input(s): ETH in the last 168 hours. Urinalysis: No results for input(s): COLORURINE, LABSPEC, PHURINE, GLUCOSEU, HGBUR, BILIRUBINUR, KETONESUR, PROTEINUR, UROBILINOGEN, NITRITE,  LEUKOCYTESUR in the last 168 hours.  Invalid input(s): APPERANCEUR   Micro Results: Recent Results (from the past 240 hour(s))  Surgical pcr screen     Status: None   Collection Time: 12/18/14  7:56 AM  Result Value Ref Range Status   MRSA, PCR NEGATIVE NEGATIVE Final   Staphylococcus aureus NEGATIVE NEGATIVE Final    Comment:        The Xpert SA Assay (FDA approved for NASAL specimens in patients over 75 years of age), is one component of a comprehensive surveillance program.  Test performance has been validated by [facility] for patients greater than or equal to 69 year old. It is not intended to diagnose infection nor to guide or monitor treatment.    Studies/Results: Dg Ang/ext/uni/or Left  12/19/2014   CLINICAL DATA:  Insertion of left upper extremity AV graft.  EXAM: LEFT ANG/EXT/UNI/ OR  FLUOROSCOPY TIME:  1 minutes, 59 seconds  COMPARISON:  None  FINDINGS: Central left upper extremity venogram demonstrates contrast injection from the level of the left axillary vein. There is wide patency of the left axillary, subclavian and imaged portions of the left innominate vein.  The SVC is not imaged though there is apparent contrast opacification of the main pulmonary artery.  Subsequent images demonstrate placement of a vascular stent overlying the left axilla.  IMPRESSION: Intraoperative images during left upper extremity central venogram and placement of a left axillary vascular stent as above.   Electronically Signed   By: Sandi Mariscal M.D.   On: 12/19/2014 14:20   Medications: I have reviewed the patient's current medications. Scheduled Meds: . amLODipine  10 mg Oral Daily  . atorvastatin  20 mg Oral q1800  . calcium acetate  1,334 mg Oral TID WC  .  ceFAZolin (ANCEF) IV  1 g Intravenous To SSTC  . cyclobenzaprine  10 mg Oral QHS  . darbepoetin (ARANESP) injection - DIALYSIS  100 mcg Intravenous Q Mon-HD  . [START ON 12/20/2014] famotidine  20 mg Oral Daily  . folic acid  1  mg Oral Daily  . hydrALAZINE  50 mg Oral 3 times per day  . isosorbide mononitrate  30 mg Oral Daily  . metoprolol succinate  100 mg Oral Daily  . multivitamin  1 tablet Oral QHS  . mycophenolate  1,500 mg Oral BID  . nystatin   Topical TID  . predniSONE  20 mg Oral TID   Continuous Infusions: . 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 RUE is unlikely to be much better access, but will need reassessment. Very concerning difficulty establishing access for patient for whom this is her barrier for transition to outpatient hemodialysis. - Continue prednisone 20mg  PO TID - Continue Cellcept 1500mg  PO BID - Continue zofran 4mg  ODT q6hrs PRN - Continue PhosLo 1334mg  TIDWM - Follow daily renal function panel, CBC - Nephrology following pt, recs appreciated - vascular surgery followint pt, recs appreciated  Anemia: 2/2 to renal impairment + acute blood loss. AOCD baseline 10.9 on admission. Hgb stable today. Patient to surgery, may have some postsurgical blood loss. - Supplement folic acid - Continue transfuse PRN to Hgb > 7 - Follow qAM CBCs  HTN: BP markedly improved since addition of Imdur. - Continue Amlodipine 10mg  PO - Continue Metoprolol succinate 100mg  PO daily - Continue Hydralazine 50mg  PO TID - Continue Imdur 30mg  PO  Candidal rash: Resolving  Thrombocytopenia and qualitative platelet dysfunction  No significant bleeding at LUE, some oozing resumed at R femoral catheter site. Pt already received 2x doses DDAVP. - IR to address RLE femoral HD catheter site bleeding today - No heparin  Pain: Pain  previously associated with renal biopsy sites. Now has some pain at surgical site, controlled - Percocet 5-325mg  q6hrs PRN - Flexeril 10mg  qHS PRN  SVT: s/p ablation 2014, has some recurrent palpitations in recent months, currently holter monitor on board. HLD: Atorvastatin 20mg  PO Hyperkalemia: Resolved  FEN: Renal diet, fluid restricted DVT ppx: SCDs FULL CODE  Dispo: Disposition deferred until improvement in clinical condition. Patient status will depend on vascular access and HD arrangement.  The patient does have a current PCP Lin Landsman, MD) and does not need an [facility] hospital follow-up appointment after discharge.  The patient does not have transportation limitations that hinder transportation to clinic appointments.   LOS: 16 days   Collier Salina, MD 12/19/2014, 4:00 PM

## 2014-12-19 NOTE — Op Note (Signed)
Procedure: Ligation of left arm AV graft  Preoperative diagnosis: Ischemic steal left arm  Postoperative diagnosis: Same  Anesthesia: Local with IV sedation  Operative details: After obtaining informed consent, patient was taken to the operative room. The patient was placed in supine position operating room table. After adequate sedation patient's entire left upper extremity was prepped and draped in usual sterile fashion. Local anesthesia was treated at a pre-existing antecubital crease incision. The incision was reopened and carried down through subcutaneous tissues down level of the graft. There was a large amount of static agent surrounding the breast. This was all removed. The graft was dissected free circumferentially. It was then ligated with a 2-0 silk tie. At this point the patient had monophasic to biphasic radial and ulnar Doppler flow. Hemostasis was obtained. The subcutaneous cutaneous tissues were reapproximated using a running 3-0 Vicryl suture. The skin was closed with a 4 Vicryl subcuticular stitch. Liquid bandage was applied to the skin. The entrance sponge and needle count was correct at the end of the case. The patient reported that her left hand was improving at the end of the case.  Ruta Hinds, MD [facility] Office: [phone] Pager: [phone]

## 2014-12-20 ENCOUNTER — Encounter (HOSPITAL_COMMUNITY): Payer: Self-pay | Admitting: Vascular Surgery

## 2014-12-20 LAB — RENAL FUNCTION PANEL
Albumin: 2.6 g/dL — ABNORMAL LOW (ref 3.5–5.0)
Anion gap: 12 (ref 5–15)
BUN: 85 mg/dL — ABNORMAL HIGH (ref 6–20)
CHLORIDE: 98 mmol/L — AB (ref 101–111)
CO2: 23 mmol/L (ref 22–32)
CREATININE: 5.99 mg/dL — AB (ref 0.44–1.00)
Calcium: 8 mg/dL — ABNORMAL LOW (ref 8.9–10.3)
GFR calc non Af Amer: 8 mL/min — ABNORMAL LOW (ref >60)
GFR, EST AFRICAN AMERICAN: 9 mL/min — AB (ref >60)
Glucose, Bld: 165 mg/dL — ABNORMAL HIGH (ref 65–99)
Phosphorus: 6.7 mg/dL — ABNORMAL HIGH (ref 2.5–4.6)
Potassium: 4.3 mmol/L (ref 3.5–5.1)
Sodium: 133 mmol/L — ABNORMAL LOW (ref 135–145)

## 2014-12-20 LAB — CBC
HCT: 21.6 % — ABNORMAL LOW (ref 36.0–46.0)
HEMOGLOBIN: 7.4 g/dL — AB (ref 12.0–15.0)
MCH: 30.6 pg (ref 26.0–34.0)
MCHC: 34.3 g/dL (ref 30.0–36.0)
MCV: 89.3 fL (ref 78.0–100.0)
PLATELETS: 115 10*3/uL — AB (ref 150–400)
RBC: 2.42 MIL/uL — AB (ref 3.87–5.11)
RDW: 13.6 % (ref 11.5–15.5)
WBC: 3.4 10*3/uL — ABNORMAL LOW (ref 4.0–10.5)

## 2014-12-20 MED ORDER — DARBEPOETIN ALFA 100 MCG/0.5ML IJ SOSY
100.0000 ug | PREFILLED_SYRINGE | INTRAMUSCULAR | Status: DC
  Administered 2015-01-03: 100 ug via INTRAVENOUS
  Filled 2014-12-20: qty 0.5

## 2014-12-20 MED ORDER — DARBEPOETIN ALFA 100 MCG/0.5ML IJ SOSY
PREFILLED_SYRINGE | INTRAMUSCULAR | Status: AC
  Filled 2014-12-20: qty 0.5

## 2014-12-20 MED ORDER — VANCOMYCIN HCL IN DEXTROSE 1-5 GM/200ML-% IV SOLN
1000.0000 mg | INTRAVENOUS | Status: AC
  Filled 2014-12-20: qty 200

## 2014-12-20 MED ORDER — MYCOPHENOLATE MOFETIL 250 MG PO CAPS
1000.0000 mg | ORAL_CAPSULE | Freq: Two times a day (BID) | ORAL | Status: DC
  Administered 2014-12-20 – 2014-12-24 (×8): 1000 mg via ORAL
  Filled 2014-12-20 (×8): qty 4

## 2014-12-20 NOTE — Progress Notes (Signed)
Subjective  -   The patient underwent a left upper arm hybrid graft 2 days ago.  Unfortunately she developed a steal following her procedure and had to have the graft ligated.  She states that her hand is back to normal.   Physical Exam:  Good strength and sensation to the left hand.  Palpable pedal pulses  Assessment/Plan:    I discussed our dialysis options with the patient.  She is not currently interested in peritoneal dialysis.  She did undergo a venogram prior to her hybrid graft placement.  Her upper extremity veins are suboptimal.  I suspect that she would most likely developed a steal syndrome if we placed a graft in her right arm.  Therefore, I have recommended proceeding with a left thigh graft.  She would like to get this done as soon as possible.  I have spoken with Dr. Kellie Simmering who possibly can do this around 10-10 30 tomorrow.  If it cannot be done then, it would be delayed until Monday.  I discussed with the patient that she is at risk for wound complications given her current immunosuppression therapy for her lupus.  She understands this and wishes to proceed.  Annamarie Major 12/20/2014 1:44 PM --  Filed Vitals:   12/20/14 1309  BP: 162/111  Pulse: 112  Temp: 98.6 F (37 C)  Resp: 19    Intake/Output Summary (Last 24 hours) at 12/20/14 1344 Last data filed at 12/20/14 1222  Gross per 24 hour  Intake 583.33 ml  Output   2400 ml  Net -1816.67 ml     Laboratory CBC    Component Value Date/Time   WBC 3.4* 12/20/2014 0810   HGB 7.4* 12/20/2014 0810   HCT 21.6* 12/20/2014 0810   PLT 115* 12/20/2014 0810    BMET    Component Value Date/Time   NA 133* 12/20/2014 0410   K 4.3 12/20/2014 0410   CL 98* 12/20/2014 0410   CO2 23 12/20/2014 0410   GLUCOSE 165* 12/20/2014 0410   BUN 85* 12/20/2014 0410   CREATININE 5.99* 12/20/2014 0410   CALCIUM 8.0* 12/20/2014 0410   GFRNONAA 8* 12/20/2014 0410   GFRAA 9* 12/20/2014 0410    COAG Lab Results    Component Value Date   INR 0.98 12/14/2014   INR 1.05 12/06/2014   INR 0.96 11/22/2014   No results found for: PTT  Antibiotics Anti-infectives    Start     Dose/Rate Route Frequency Ordered Stop   12/18/14 2345  ceFAZolin (ANCEF) IVPB 1 g/50 mL premix    Comments:  Send with pt to OR   1 g 100 mL/hr over 30 Minutes Intravenous To Short Stay 12/18/14 2337 12/19/14 2345   12/18/14 1115  cefUROXime (ZINACEF) 1.5 g in dextrose 5 % 50 mL IVPB     1.5 g 100 mL/hr over 30 Minutes Intravenous To ShortStay Surgical 12/17/14 0948 12/18/14 1324   12/06/14 1545  ceFAZolin (ANCEF) IVPB 2 g/50 mL premix     2 g 100 mL/hr over 30 Minutes Intravenous To Radiology 12/06/14 1543 12/07/14 0859   12/06/14 1200  cephALEXin (KEFLEX) capsule 250 mg     250 mg Oral Every 12 hours 12/06/14 1116 12/10/14 0959   12/05/14 1400  cefTRIAXone (ROCEPHIN) 1 g in dextrose 5 % 50 mL IVPB  Status:  Discontinued     1 g 100 mL/hr over 30 Minutes Intravenous Every 24 hours 12/05/14 1113 12/06/14 1116   12/04/14 2000  ciprofloxacin (  CIPRO) tablet 250 mg  Status:  Discontinued     250 mg Oral 2 times daily 12/04/14 1637 12/05/14 1147   12/04/14 1400  cefTRIAXone (ROCEPHIN) 1 g in dextrose 5 % 50 mL IVPB  Status:  Discontinued     1 g 100 mL/hr over 30 Minutes Intravenous Every 24 hours 12/03/14 1736 12/04/14 1637   12/04/14 0000  ciprofloxacin (CIPRO) 250 MG tablet     250 mg Oral 2 times daily 12/04/14 1647     12/03/14 1345  cefTRIAXone (ROCEPHIN) 1 g in dextrose 5 % 50 mL IVPB     1 g 100 mL/hr over 30 Minutes Intravenous  Once 12/03/14 1343 12/03/14 1551       V. Leia Alf, M.D. [facility] [place] Office: [phone] Pager:  [phone]

## 2014-12-20 NOTE — Progress Notes (Signed)
Patient ID: [name], female   DOB: 1975/07/13, 39 y.o.   MRN: [id number]  [facility] Progress Note   Assessment/ Plan:   1. 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 Currently on TThS schedule, has outpatient HD spot. HD today. Since LUE hybrid graft unsuccessful and with complications now looking for alternative access site. Vascular surgery to see today to discuss long term access options, possibly LLE graft. We discussed PD as an option for her, but patient does not feel comfortable with this option, she is obese, and her hx of multiple abdominal surgeries (C-sections, tubal ligation, and appendectomy) limit this as an option for her. Dr. Lorrene Reid spoke with Dr. Florene Glen and he would like to decrease her Cellcept to 1,000 mg BID and keep Prednisone 20 mg TID. IR to check on catheter this AM.  2. Hypertension: BP 751W-258N systolic.  Continue Norvasc 10 mg daily, Hydralazine 50 mg Q8H, and Toprol-XL 100 mg daily.  3. Anemia: Hgb stable. On Aranesp. Bleeding from catheter has improved.  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 PTH level 71. Will need to have PTH monitoring, check in 3 months.   Subjective:   No acute events overnight. Patient in much better spirits this morning. Anxious to speak with vascular surgeon.    Objective:   BP 156/92 mmHg  Pulse 98  Temp(Src) 97.7 F (36.5 C) (Oral)  Resp 19  Ht 5\' 3"  (1.6 m)  Wt 231 lb 14.8 oz (105.2 kg)  BMI 41.09 kg/m2  SpO2 100%  LMP 11/17/2014 (Approximate)  Intake/Output Summary (Last 24 hours) at 12/20/14 1010 Last data filed at 12/20/14 0600  Gross per 24 hour  Intake 583.33 ml  Output      0 ml  Net 583.33 ml   Weight change: 3 lb 1.4 oz (1.4 kg)  Physical Exam: Gen: alert, sitting up in dialysis bed, pleasant, NAD   CVS: RRR, no m/g/r Resp: CTA bilaterally, breaths non-labored Abd: BS+, soft, obese, non-tender Ext: trace LE edema. Right femoral tunneled dialysis catheter without bleeding. Good distal pulses in upper extremities.  Imaging: Dg Ang/ext/uni/or Left  12/19/2014   CLINICAL DATA:  Insertion of left upper extremity AV graft.  EXAM: LEFT ANG/EXT/UNI/ OR  FLUOROSCOPY TIME:  1 minutes, 59 seconds  COMPARISON:  None  FINDINGS: Central left upper extremity venogram demonstrates contrast injection from the level of the left axillary vein. There is wide patency of the left axillary, subclavian and imaged portions of the left innominate vein.  The SVC is not imaged though there is apparent contrast opacification of the main pulmonary artery.  Subsequent images demonstrate placement of a vascular stent overlying the left axilla.  IMPRESSION: Intraoperative images during left upper extremity central venogram and placement of a left axillary vascular stent as above.   Electronically Signed   By: Sandi Mariscal M.D.   On: 12/19/2014 14:20    Labs: BMET  Recent Labs Lab 12/15/14 0440 12/16/14 0457 12/17/14 0418 12/17/14 0902 12/18/14 0455 12/19/14 0541 12/20/14 0410  NA 133* 136 132* 137 135 134* 133*  K 4.4 4.5 4.5 4.8 4.3 4.3 4.3  CL 96* 101 96* 101 98* 98* 98*  CO2 26 25 23 22 27 24 23   GLUCOSE 124* 138* 137* 133* 136* 145* 165*  BUN 50* 68* 87* 98* 56* 76* 85*  CREATININE 4.01*  5.75* 6.64* 6.75* 4.65* 5.70* 5.99*  CALCIUM 8.1* 8.4* 8.4* 8.5* 8.2* 8.0* 8.0*  PHOS 6.4* 6.5* 6.5* 6.7* 5.6* 6.6* 6.7*   CBC  Recent Labs Lab 12/16/14 0457 12/16/14 1936 12/17/14 0903 12/18/14 0455 12/20/14 0810  WBC 9.0 8.9 7.2 5.8 3.4*  NEUTROABS 8.7*  --   --   --   --   HGB 6.9* 9.8* 9.5* 8.8* 7.4*  HCT 20.3* 28.9* 27.4* 25.6* 21.6*  MCV 88.3 87.8 87.3 88.0 89.3  PLT 139* 146* 154 110* 115*    Medications:    . amLODipine  10 mg Oral Daily  . atorvastatin  20 mg Oral q1800  . calcium acetate   1,334 mg Oral TID WC  . cyclobenzaprine  10 mg Oral QHS  . darbepoetin (ARANESP) injection - DIALYSIS  100 mcg Intravenous Q Mon-HD  . famotidine  20 mg Oral Daily  . folic acid  1 mg Oral Daily  . hydrALAZINE  50 mg Oral 3 times per day  . isosorbide mononitrate  30 mg Oral Daily  . metoprolol succinate  100 mg Oral Daily  . multivitamin  1 tablet Oral QHS  . mycophenolate  1,500 mg Oral BID  . nystatin   Topical TID  . predniSONE  20 mg Oral TID   [name], MD, MPH Internal Medicine PGY-2  12/20/2014, 10:10 AM

## 2014-12-20 NOTE — Progress Notes (Addendum)
Subjective: Patient feeling overall well but tired today. Underwent dialysis without complication. She does have some increase in ecchymoses around surgical site but otherwise no significant external bleeding/oozing. No problems with her left hand today, feels the same as normal. She has spoken with nephrology service and vascular surgery service and expressed remaining wishes to have left thigh AV graft placement attempted tomorrow if possible. She was not interested in considering peritoneal dialysis after discussing with nephrology service. We'll continue to follow her as inpatient closely towards resolution of outpatient ASCUS  Objective: Vital signs in last 24 hours: Filed Vitals:   12/20/14 1130 12/20/14 1200 12/20/14 1222 12/20/14 1309  BP: 163/104 155/110 163/109 162/111  Pulse: 96 100 97 112  Temp:   97.5 F (36.4 C) 98.6 F (37 C)  TempSrc:   Oral Oral  Resp:   20 19  Height:      Weight:   103.9 kg (229 lb 0.9 oz)   SpO2:    100%   Weight change: 1.4 kg (3 lb 1.4 oz)  Intake/Output Summary (Last 24 hours) at 12/20/14 1604 Last data filed at 12/20/14 1222  Gross per 24 hour  Intake 583.33 ml  Output   2400 ml  Net -1816.67 ml   GENERAL- alert, orientedx3, NAD HEENT- Normocephalic, moist oral mucosa CARDIAC- RRR, no murmurs, rubs or gallops. RESP- CTAB, no wheezes or crackles. ABDOMEN- Nontender, not distended, scattered small bruises present EXTREMITIES- No pedal edema, dry dressings intact around R fem catheter, ecchymoses around LUE surgical site SKIN- Warm, dry, ecchymoses around LIJ incision site [facility]- calm, appropriate mood and affect  Lab Results: Basic Metabolic Panel:  Recent Labs Lab 12/19/14 0541 12/20/14 0410  NA 134* 133*  K 4.3 4.3  CL 98* 98*  CO2 24 23  GLUCOSE 145* 165*  BUN 76* 85*  CREATININE 5.70* 5.99*  CALCIUM 8.0* 8.0*  PHOS 6.6* 6.7*   Liver Function Tests:  Recent Labs Lab 12/19/14 0541 12/20/14 0410  ALBUMIN 2.8* 2.6*     No results for input(s): LIPASE, AMYLASE in the last 168 hours. No results for input(s): AMMONIA in the last 168 hours. CBC:  Recent Labs Lab 12/16/14 0457  12/18/14 0455 12/20/14 0810  WBC 9.0  < > 5.8 3.4*  NEUTROABS 8.7*  --   --   --   HGB 6.9*  < > 8.8* 7.4*  HCT 20.3*  < > 25.6* 21.6*  MCV 88.3  < > 88.0 89.3  PLT 139*  < > 110* 115*  < > = values in this interval not displayed. Cardiac Enzymes: No results for input(s): CKTOTAL, CKMB, CKMBINDEX, TROPONINI in the last 168 hours. BNP: No results for input(s): PROBNP in the last 168 hours. D-Dimer: No results for input(s): DDIMER in the last 168 hours. CBG: No results for input(s): GLUCAP in the last 168 hours. Hemoglobin A1C: No results for input(s): HGBA1C in the last 168 hours. Fasting Lipid Panel: No results for input(s): CHOL, HDL, LDLCALC, TRIG, CHOLHDL, LDLDIRECT in the last 168 hours. Thyroid Function Tests: No results for input(s): TSH, T4TOTAL, FREET4, T3FREE, THYROIDAB in the last 168 hours. Coagulation:  Recent Labs Lab 12/14/14 0405  LABPROT 13.2  INR 0.98   Anemia Panel:  Recent Labs Lab 12/15/14 1050  VITAMINB12 480  FOLATE 4.2*  FERRITIN 139  TIBC 238*  IRON 89  RETICCTPCT 1.7   Urine Drug Screen: Drugs of Abuse     Component Value Date/Time   LABOPIA NONE DETECTED 07/21/2012  0215   COCAINSCRNUR NONE DETECTED 07/21/2012 0215   LABBENZ NONE DETECTED 07/21/2012 0215   AMPHETMU NONE DETECTED 07/21/2012 0215   THCU NONE DETECTED 07/21/2012 0215   LABBARB NONE DETECTED 07/21/2012 0215    Alcohol Level: No results for input(s): ETH in the last 168 hours. Urinalysis: No results for input(s): COLORURINE, LABSPEC, PHURINE, GLUCOSEU, HGBUR, BILIRUBINUR, KETONESUR, PROTEINUR, UROBILINOGEN, NITRITE, LEUKOCYTESUR in the last 168 hours.  Invalid input(s): APPERANCEUR   Micro Results: Recent Results (from the past 240 hour(s))  Surgical pcr screen     Status: None   Collection Time:  12/18/14  7:56 AM  Result Value Ref Range Status   MRSA, PCR NEGATIVE NEGATIVE Final   Staphylococcus aureus NEGATIVE NEGATIVE Final    Comment:        The Xpert SA Assay (FDA approved for NASAL specimens in patients over 75 years of age), is one component of a comprehensive surveillance program.  Test performance has been validated by [facility] for patients greater than or equal to 35 year old. It is not intended to diagnose infection nor to guide or monitor treatment.    Studies/Results: No results found. Medications: I have reviewed the patient's current medications. Scheduled Meds: . amLODipine  10 mg Oral Daily  . atorvastatin  20 mg Oral q1800  . calcium acetate  1,334 mg Oral TID WC  . cyclobenzaprine  10 mg Oral QHS  . [START ON 01/03/2015] darbepoetin (ARANESP) injection - DIALYSIS  100 mcg Intravenous Q Thu-HD  . famotidine  20 mg Oral Daily  . folic acid  1 mg Oral Daily  . hydrALAZINE  50 mg Oral 3 times per day  . isosorbide mononitrate  30 mg Oral Daily  . metoprolol succinate  100 mg Oral Daily  . multivitamin  1 tablet Oral QHS  . mycophenolate  1,000 mg Oral BID  . nystatin   Topical TID  . predniSONE  20 mg Oral TID  . [START ON 12/21/2014] vancomycin  1,000 mg Intravenous To SS-Surg   Continuous Infusions: . 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 RUE is unlikely to be much better access, but will need reassessment. Very concerning difficulty establishing access for patient for whom this is her barrier for transition to outpatient hemodialysis. Discussed peritoneal dialysis with  nephrology and was not interested today. - Continue prednisone 20mg  PO TID - Continue Cellcept 1500mg  PO BID - Continue zofran 4mg  ODT q6hrs PRN - Continue PhosLo 1334mg  TIDWM - Follow daily renal function panel, CBC - Nephrology following pt, recs appreciated - vascular surgery followint pt, recs appreciated  Anemia: 2/2 to renal impairment + acute blood loss. AOCD baseline 10.9 on admission. Hemoglobin decreased to 7.4 from 8.8 on 9/20. Most likely due to small surgical blood losses. - Supplement folic acid - Continue transfuse PRN to Hgb > 7 - Follow qAM CBCs  HTN: BP markedly improved since addition of Imdur. - Continue Amlodipine 10mg  PO - Continue Metoprolol succinate 100mg  PO daily - Continue Hydralazine 50mg  PO TID - Continue Imdur 30mg  PO  Candidal rash: Resolving  Thrombocytopenia and qualitative platelet dysfunction  No significant bleeding at LUE, some oozing resumed at R femoral catheter site. Pt already received 2x doses DDAVP. -  IR to assess RLE HD catheter again today - No heparin  Pain: Pain previously associated with renal biopsy sites. Now has some pain at surgical site, controlled - Percocet 5-325mg  q6hrs PRN - Flexeril 10mg  qHS PRN  SVT: s/p ablation 2014, has some recurrent palpitations in recent months, currently holter monitor on board. HLD: Atorvastatin 20mg  PO Hyperkalemia: Resolved  FEN: Renal diet, fluid restricted, nothing by mouth at midnight for possible vascular surgery DVT ppx: SCDs FULL CODE  Dispo: Disposition deferred until improvement in clinical condition. Patient status will depend on vascular access and HD arrangement.  The patient does have a current PCP Lin Landsman, MD) and does not need an [facility] hospital follow-up appointment after discharge.  The patient does not have transportation limitations that hinder transportation to clinic appointments.   LOS: 17 days   Collier Salina, MD 12/20/2014, 4:04 PM

## 2014-12-20 NOTE — Anesthesia Postprocedure Evaluation (Signed)
  Anesthesia Post-op Note  Patient: [name]  Procedure(s) Performed: Procedure(s) (LRB): INSERTION GORE HYBRID ARTERIOVENOUS GRAFT LEFT AXILLO-BRACHIAL. (Left)  Patient Location: PACU  Anesthesia Type: General  Level of Consciousness: awake and alert   Airway and Oxygen Therapy: Patient Spontanous Breathing  Post-op Pain: mild  Post-op Assessment: Post-op Vital signs reviewed, Patient's Cardiovascular Status Stable, Respiratory Function Stable, Patent Airway and No signs of Nausea or vomiting  Last Vitals:  Filed Vitals:   12/19/14 2043  BP: 138/75  Pulse: 98  Temp: 37 C  Resp: 18    Post-op Vital Signs: stable   Complications: No apparent anesthesia complications

## 2014-12-20 NOTE — Procedures (Signed)
I have personally attended this patient's dialysis session. TDC at 400 - no blood on dressing today - IR cleaned off clot and applied V-pad yesterday - they will reassess today) 2K bath K 4.3 2 liter fluid goal with pe weight 105.6. No heparin Tolerating treatment so far. Still waiting for plan from [facility] as pt wants to get thigh graft before being discharged from the hospital.  Jamal Maes, MD [place] Pager 12/20/2014, 8:34 AM

## 2014-12-21 ENCOUNTER — Inpatient Hospital Stay (HOSPITAL_COMMUNITY): Payer: Medicaid Other

## 2014-12-21 DIAGNOSIS — M6289 Other specified disorders of muscle: Secondary | ICD-10-CM

## 2014-12-21 DIAGNOSIS — T82898D Other specified complication of vascular prosthetic devices, implants and grafts, subsequent encounter: Secondary | ICD-10-CM

## 2014-12-21 DIAGNOSIS — G451 Carotid artery syndrome (hemispheric): Secondary | ICD-10-CM

## 2014-12-21 DIAGNOSIS — K661 Hemoperitoneum: Secondary | ICD-10-CM

## 2014-12-21 DIAGNOSIS — R58 Hemorrhage, not elsewhere classified: Secondary | ICD-10-CM

## 2014-12-21 DIAGNOSIS — I639 Cerebral infarction, unspecified: Secondary | ICD-10-CM

## 2014-12-21 DIAGNOSIS — L8990 Pressure ulcer of unspecified site, unspecified stage: Secondary | ICD-10-CM | POA: Diagnosis not present

## 2014-12-21 DIAGNOSIS — G459 Transient cerebral ischemic attack, unspecified: Secondary | ICD-10-CM

## 2014-12-21 HISTORY — DX: Transient cerebral ischemic attack, unspecified: G45.9

## 2014-12-21 LAB — LIPID PANEL
Cholesterol: 113 mg/dL (ref 0–200)
HDL: 53 mg/dL (ref >40)
LDL Cholesterol: 39 mg/dL (ref 0–99)
Total CHOL/HDL Ratio: 2.1 RATIO
Triglycerides: 107 mg/dL (ref <150)
VLDL: 21 mg/dL (ref 0–40)

## 2014-12-21 LAB — CBC
HEMATOCRIT: 20.3 % — AB (ref 36.0–46.0)
HEMOGLOBIN: 6.7 g/dL — AB (ref 12.0–15.0)
MCH: 29.9 pg (ref 26.0–34.0)
MCHC: 33 g/dL (ref 30.0–36.0)
MCV: 90.6 fL (ref 78.0–100.0)
Platelets: 72 10*3/uL — ABNORMAL LOW (ref 150–400)
RBC: 2.24 MIL/uL — AB (ref 3.87–5.11)
RDW: 13.8 % (ref 11.5–15.5)
WBC: 2.9 10*3/uL — ABNORMAL LOW (ref 4.0–10.5)

## 2014-12-21 LAB — RENAL FUNCTION PANEL
ANION GAP: 8 (ref 5–15)
Albumin: 2.4 g/dL — ABNORMAL LOW (ref 3.5–5.0)
BUN: 44 mg/dL — ABNORMAL HIGH (ref 6–20)
CALCIUM: 7.7 mg/dL — AB (ref 8.9–10.3)
CO2: 27 mmol/L (ref 22–32)
Chloride: 98 mmol/L — ABNORMAL LOW (ref 101–111)
Creatinine, Ser: 4.16 mg/dL — ABNORMAL HIGH (ref 0.44–1.00)
GFR calc non Af Amer: 13 mL/min — ABNORMAL LOW (ref >60)
GFR, EST AFRICAN AMERICAN: 15 mL/min — AB (ref >60)
Glucose, Bld: 133 mg/dL — ABNORMAL HIGH (ref 65–99)
POTASSIUM: 4 mmol/L (ref 3.5–5.1)
Phosphorus: 4.8 mg/dL — ABNORMAL HIGH (ref 2.5–4.6)
SODIUM: 133 mmol/L — AB (ref 135–145)

## 2014-12-21 LAB — HEMOGLOBIN AND HEMATOCRIT, BLOOD
HEMATOCRIT: 25.5 % — AB (ref 36.0–46.0)
HEMOGLOBIN: 8.5 g/dL — AB (ref 12.0–15.0)

## 2014-12-21 LAB — GLUCOSE, CAPILLARY: Glucose-Capillary: 117 mg/dL — ABNORMAL HIGH (ref 65–99)

## 2014-12-21 MED ORDER — METOPROLOL SUCCINATE ER 100 MG PO TB24
100.0000 mg | ORAL_TABLET | Freq: Every day | ORAL | Status: DC
  Administered 2014-12-21 – 2014-12-30 (×11): 100 mg via ORAL
  Filled 2014-12-21 (×12): qty 1

## 2014-12-21 MED ORDER — DEXTROSE 5 % IV SOLN
1.5000 g | INTRAVENOUS | Status: DC
  Filled 2014-12-21: qty 1.5

## 2014-12-21 MED ORDER — SODIUM CHLORIDE 0.9 % IV SOLN: Freq: Once | INTRAVENOUS | Status: DC

## 2014-12-21 NOTE — Progress Notes (Signed)
CRITICAL VALUE ALERT  Critical value received:  Hemoglobin 6.7   Date of notification: 12/21/2014  Time of notification:  622  Critical value read back:Yes.    Nurse who received alert:  Job Founds, RN  MD notified (1st page):  Paged oncall MD  Time of first page:  16  Time MD responded: 625

## 2014-12-21 NOTE — Anesthesia Postprocedure Evaluation (Signed)
Anesthesia Post Note  Patient: [name]  Procedure(s) Performed: Procedure(s) (LRB): LIGATION OF FISTULA (Left)  Anesthesia type: MAC  Patient location: PACU  Post pain: Pain level controlled and Adequate analgesia  Post assessment: Post-op Vital signs reviewed, Patient's Cardiovascular Status Stable and Respiratory Function Stable  Last Vitals:  Filed Vitals:   12/21/14 1636  BP: 153/79  Pulse: 93  Temp: 36.9 C  Resp: 22    Post vital signs: Reviewed and stable  Level of consciousness: awake, alert  and oriented  Complications: No apparent anesthesia complications

## 2014-12-21 NOTE — Progress Notes (Signed)
Subjective: Initially saw patient around 8:00AM when she was feeling tired but pleasantly conversant with nurses. She reported little to no bleeding overnight. Informed her that her blood count and platelets were very low today and would need replacement. After this evaluation she was informed that due to her concerning findings her surgery would be delayed until next week for safety. She became highly agitated with staff by around 10:30am saying she wished to leave if no surgery would be possible. Internal medicine arrived at the patient's room around 11:20 AM to discuss concerns with her and noted her to be more somnolent. Quick neurological assessment was performed and called Code Stroke for concern of numerous new neurological deficits.  Objective: Vital signs in last 24 hours: Filed Vitals:   12/20/14 1756 12/20/14 2114 12/21/14 0204 12/21/14 0530  BP: 141/75 134/76 133/72 139/87  Pulse: 113 111 113 96  Temp:  98.9 F (37.2 C)  98.5 F (36.9 C)  TempSrc:  Oral  Oral  Resp:  18  18  Height:      Weight:    104.2 kg (229 lb 11.5 oz)  SpO2:  98%  99%   Weight change: -0.4 kg (-14.1 oz)  Intake/Output Summary (Last 24 hours) at 12/21/14 1147 Last data filed at 12/21/14 1050  Gross per 24 hour  Intake    120 ml  Output   2400 ml  Net  -2280 ml   GENERAL- Lethargic appearing HEENT- Left sided facial droop present, pupils equal bilaterally, eyes deviated to the right throughout encounter travelling to midline on prompting CARDIAC- RRR, no murmurs, rubs or gallops. RESP- CTAB, no wheezes or crackles. ABDOMEN- Nontender, not distended, bruising present laterally above left lower waistline EXTREMITIES- 2+ palpable distal pulses, no pedal edema, dry dressings intact around R fem catheter, ecchymoses around LUE surgical site SKIN- Warm, dry, ecchymoses around LIJ incision site  NEURO- Left visual field neglect, eyes deviated right and does not track past midline LUE 0/5 strength on  shoulder shrug, flexion, extension, finger grip, RUE 5/5 strength throughout, sensation grossly intact LLE 2/5 strength on leg raise, foot dorsiflexion and plantarflexion, RLE strength 5/5  PSYCH- Blunted affect, slurred speech  Lab Results: Basic Metabolic Panel:  Recent Labs Lab 12/20/14 0410 12/21/14 0500  NA 133* 133*  K 4.3 4.0  CL 98* 98*  CO2 23 27  GLUCOSE 165* 133*  BUN 85* 44*  CREATININE 5.99* 4.16*  CALCIUM 8.0* 7.7*  PHOS 6.7* 4.8*   Liver Function Tests:  Recent Labs Lab 12/20/14 0410 12/21/14 0500  ALBUMIN 2.6* 2.4*   No results for input(s): LIPASE, AMYLASE in the last 168 hours. No results for input(s): AMMONIA in the last 168 hours. CBC:  Recent Labs Lab 12/16/14 0457  12/20/14 0810 12/21/14 0500  WBC 9.0  < > 3.4* 2.9*  NEUTROABS 8.7*  --   --   --   HGB 6.9*  < > 7.4* 6.7*  HCT 20.3*  < > 21.6* 20.3*  MCV 88.3  < > 89.3 90.6  PLT 139*  < > 115* 72*  < > = values in this interval not displayed. Cardiac Enzymes: No results for input(s): CKTOTAL, CKMB, CKMBINDEX, TROPONINI in the last 168 hours. BNP: No results for input(s): PROBNP in the last 168 hours. D-Dimer: No results for input(s): DDIMER in the last 168 hours. CBG:  Recent Labs Lab 12/21/14 1138  GLUCAP 117*   Hemoglobin A1C: No results for input(s): HGBA1C in the last 168 hours.  Fasting Lipid Panel: No results for input(s): CHOL, HDL, LDLCALC, TRIG, CHOLHDL, LDLDIRECT in the last 168 hours. Thyroid Function Tests: No results for input(s): TSH, T4TOTAL, FREET4, T3FREE, THYROIDAB in the last 168 hours. Coagulation: No results for input(s): LABPROT, INR in the last 168 hours. Anemia Panel:  Recent Labs Lab 12/15/14 1050  VITAMINB12 480  FOLATE 4.2*  FERRITIN 139  TIBC 238*  IRON 89  RETICCTPCT 1.7   Urine Drug Screen: Drugs of Abuse     Component Value Date/Time   LABOPIA NONE DETECTED 07/21/2012 0215   COCAINSCRNUR NONE DETECTED 07/21/2012 0215   LABBENZ NONE  DETECTED 07/21/2012 0215   AMPHETMU NONE DETECTED 07/21/2012 0215   THCU NONE DETECTED 07/21/2012 0215   LABBARB NONE DETECTED 07/21/2012 0215    Alcohol Level: No results for input(s): ETH in the last 168 hours. Urinalysis: No results for input(s): COLORURINE, LABSPEC, PHURINE, GLUCOSEU, HGBUR, BILIRUBINUR, KETONESUR, PROTEINUR, UROBILINOGEN, NITRITE, LEUKOCYTESUR in the last 168 hours.  Invalid input(s): APPERANCEUR   Micro Results: Recent Results (from the past 240 hour(s))  Surgical pcr screen     Status: None   Collection Time: 12/18/14  7:56 AM  Result Value Ref Range Status   MRSA, PCR NEGATIVE NEGATIVE Final   Staphylococcus aureus NEGATIVE NEGATIVE Final    Comment:        The Xpert SA Assay (FDA approved for NASAL specimens in patients over 22 years of age), is one component of a comprehensive surveillance program.  Test performance has been validated by [facility] for patients greater than or equal to 67 year old. It is not intended to diagnose infection nor to guide or monitor treatment.    Studies/Results: No results found. Medications: I have reviewed the patient's current medications. Scheduled Meds: . sodium chloride   Intravenous Once  . amLODipine  10 mg Oral Daily  . atorvastatin  20 mg Oral q1800  . calcium acetate  1,334 mg Oral TID WC  . cyclobenzaprine  10 mg Oral QHS  . [START ON 01/03/2015] darbepoetin (ARANESP) injection - DIALYSIS  100 mcg Intravenous Q Thu-HD  . famotidine  20 mg Oral Daily  . folic acid  1 mg Oral Daily  . hydrALAZINE  50 mg Oral 3 times per day  . isosorbide mononitrate  30 mg Oral Daily  . metoprolol succinate  100 mg Oral Daily  . multivitamin  1 tablet Oral QHS  . mycophenolate  1,000 mg Oral BID  . nystatin   Topical TID  . predniSONE  20 mg Oral TID  . vancomycin  1,000 mg Intravenous To SS-Surg   Continuous Infusions: . sodium chloride 20 mL/hr at 12/18/14 1615   PRN Meds:.acetaminophen, fentaNYL  (SUBLIMAZE) injection, gelatin adsorbable, iohexol, morphine injection, ondansetron (ZOFRAN) IV, ondansetron, oxyCODONE **OR** oxyCODONE, oxyCODONE-acetaminophen, sodium chloride Assessment/Plan: Acute neurologic change High suspicion for stroke as patient at risk from multiple factors including surgery, extended clotting, lupus. Anticoagulation has been limited to SCDs throughout admission for active bleeding. Code stroke called and patient sent for immediate head CT. New right headache and left sided deficits. Will follow up stroke team.  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 Patient on TTS dialysis at this time. - Continue prednisone 20mg  PO TID - Continue Cellcept 1000mg  PO BID - Continue zofran 4mg  ODT q6hrs PRN - Continue PhosLo 1334mg  TIDWM - Follow daily renal function panel, CBC - Nephrology following  pt, recs appreciated - vascular surgery followint pt, recs appreciated  Anemia: 2/2 to renal impairment + acute blood loss. AOCD baseline 10.9 on admission. Hgb decreased to 6.7 today.  - Supplement folic acid - Continue transfuse PRN to Hgb > 7 - Follow qAM CBCs - Pt to supplement EPO per nephrology  HTN: BP markedly improved since addition of Imdur. Had one reading >200 SBP noted overnight otherwise appropriate. - Continue Amlodipine 10mg  PO - Continue Metoprolol succinate 100mg  PO daily - Continue Hydralazine 50mg  PO TID - Continue Imdur 30mg  PO  Candidal rash: Resolved  Thrombocytopenia and qualitative platelet dysfunction  Plt 72. No obviously increased external blood loss. Pt already received 2x doses DDAVP early in admission. HIT and DIC panels negative. - No heparin  Pain: Pain previously associated with renal biopsy sites. Now has some pain at surgical site, controlled - Percocet 5-325mg  q6hrs PRN - Flexeril 10mg  qHS PRN  SVT: s/p ablation 2014, has some  recurrent palpitations in recent months, currently holter monitor on board. HLD: Atorvastatin 20mg  PO Hyperkalemia: Resolved  FEN: Renal diet, fluid restricted DVT ppx: SCDs FULL CODE  Dispo: Disposition deferred until improvement in clinical condition. Patient status will depend on vascular access and HD arrangement.  The patient does have a current PCP Lin Landsman, MD) and does not need an [facility] hospital follow-up appointment after discharge.  The patient does not have transportation limitations that hinder transportation to clinic appointments.   LOS: 18 days   Collier Salina, MD 12/21/2014, 11:47 AM

## 2014-12-21 NOTE — Progress Notes (Signed)
Procedure had to be cancelled today.  On the OR schedule for Monday with Dr. Marilynne Drivers

## 2014-12-21 NOTE — Consult Note (Addendum)
WOC wound consult note Reason for Consult: Consult requested for buttocks.  Pt has had several diagnostic procedures which required laying on a hard surface and also went to the OR on 9/20 and 9/21,  her mother who is at the bedside to assess the wounds.  Wound type: Appearance consistent with a previous deep tissue injury which evolved into blistering and ruptured. Now it has evolved into 2 stage 2 wounds Pressure Ulcer POA: No Measurement: Left buttock 3X1X.1cm, right buttock 4X3X.1cm Wound bed: Both wounds are pink and moist, no odor or drainage Periwound: Darker colored skin surrounding wounds; appearance consistent with deep tissue injury which evolved Dressing procedure/placement/frequency: Foam dressings to protect and promote healing.  Discussed plan of care with patient and mother and they deny further questions. Please re-consult if further assistance is needed.  Thank-you,  Julien Girt MSN, [place], [place], [place], [place]

## 2014-12-21 NOTE — Progress Notes (Signed)
*  PRELIMINARY RESULTS* Vascular Ultrasound Carotid Duplex (Doppler) has been completed.   Study was technically limited due to patient anatomy, depth of vessels, and patient body habitus. Findings suggest 1-39% left internal carotid artery stenosis. Unable to adequately evaluate the right internal carotid artery, therefore cannot exclude stenosis. Vertebral arteries are patent with antegrade flow.  12/21/2014 5:36 PM Maudry Mayhew, RVT, RDCS, RDMS

## 2014-12-21 NOTE — Code Documentation (Signed)
39yo female admitted for acute renal failure secondary to lupus nephritis.  Today she was at her baseline at 0900 per nursing.  The patient experienced some agitation around 1030 and then was assessed by the primary medical team to have right gaze preference and left sided weakness.  Code stroke was activated.  Stroke team to the bedside.  Patient transported by Stroke RN and 5W RN to CT.  Patient tolerated CT and was transported back to [id number].  NIHSS 3, see documentation for details and code stroke times.  Patient with slight left facial asymmetry and bilateral leg drift.  Dr. Doy Mince to the bedside.  Patient is contraindicated for tPA d/t recent surgery.  No acute stroke treatment at this time.  Bedside handoff with 5W RN.

## 2014-12-21 NOTE — Progress Notes (Signed)
Patient ID: [name], female   DOB: [birth year], 39 y.o.   MRN: [id number]  [facility] Progress Note   Assessment/ Plan:   1. 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 Currently on TThS schedule, has outpatient HD spot. Patient to get left thigh graft this morning with vascular surgery. Continue Cellcept to 1,000 mg BID and keep Prednisone 20 mg TID.  2. Hypertension: BP 166A-630Z systolic.  Continue Norvasc 10 mg daily, Hydralazine 50 mg Q8H, and Toprol-XL 100 mg daily.  3. Anemia: Hgb dropped to 6.7 this AM. Getting 1 unit PRBCs prior to surgery. On Aranesp. Bleeding from catheter has improved.  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 PTH level 71. Will need to have PTH monitoring, check in 3 months.   Subjective:   No acute events overnight. Patient glad to be getting left thigh graft today. Hgb dropped to 6.7 this morning, getting 1 unit PRBCs. Patient denies bleeding from catheter. She reports her menstrual period started last night. Denies SOB.    Objective:   BP 139/87 mmHg  Pulse 96  Temp(Src) 98.5 F (36.9 C) (Oral)  Resp 18  Ht 5\' 3"  (1.6 m)  Wt 229 lb 11.5 oz (104.2 kg)  BMI 40.70 kg/m2  SpO2 99%  LMP 11/17/2014 (Approximate)  Intake/Output Summary (Last 24 hours) at 12/21/14 0759 Last data filed at 12/20/14 1222  Gross per 24 hour  Intake      0 ml  Output   2400 ml  Net  -2400 ml   Weight change: -14.1 oz (-0.4 kg)  Physical Exam: Gen: alert, resting in bed, pleasant, NAD  CVS: RRR, no m/g/r Resp: CTA bilaterally, breaths non-labored Abd: BS+, soft, obese, non-tender Ext: trace LE edema. Right femoral tunneled dialysis catheter without bleeding. Good distal pulses in upper extremities.  Imaging: No results found.  Labs: BMET  Recent Labs Lab  12/16/14 0457 12/17/14 0418 12/17/14 0902 12/18/14 0455 12/19/14 0541 12/20/14 0410 12/21/14 0500  NA 136 132* 137 135 134* 133* 133*  K 4.5 4.5 4.8 4.3 4.3 4.3 4.0  CL 101 96* 101 98* 98* 98* 98*  CO2 25 23 22 27 24 23 27   GLUCOSE 138* 137* 133* 136* 145* 165* 133*  BUN 68* 87* 98* 56* 76* 85* 44*  CREATININE 5.75* 6.64* 6.75* 4.65* 5.70* 5.99* 4.16*  CALCIUM 8.4* 8.4* 8.5* 8.2* 8.0* 8.0* 7.7*  PHOS 6.5* 6.5* 6.7* 5.6* 6.6* 6.7* 4.8*   CBC  Recent Labs Lab 12/16/14 0457  12/17/14 0903 12/18/14 0455 12/20/14 0810 12/21/14 0500  WBC 9.0  < > 7.2 5.8 3.4* 2.9*  NEUTROABS 8.7*  --   --   --   --   --   HGB 6.9*  < > 9.5* 8.8* 7.4* 6.7*  HCT 20.3*  < > 27.4* 25.6* 21.6* 20.3*  MCV 88.3  < > 87.3 88.0 89.3 90.6  PLT 139*  < > 154 110* 115* 72*  < > = values in this interval not displayed.  Medications:    . sodium chloride   Intravenous Once  . amLODipine  10 mg Oral Daily  . atorvastatin  20 mg Oral q1800  . calcium acetate  1,334 mg Oral TID WC  . cyclobenzaprine  10 mg Oral QHS  . [START ON 01/03/2015] darbepoetin (ARANESP) injection - DIALYSIS  100 mcg Intravenous  Q Thu-HD  . famotidine  20 mg Oral Daily  . folic acid  1 mg Oral Daily  . hydrALAZINE  50 mg Oral 3 times per day  . isosorbide mononitrate  30 mg Oral Daily  . metoprolol succinate  100 mg Oral Daily  . multivitamin  1 tablet Oral QHS  . mycophenolate  1,000 mg Oral BID  . nystatin   Topical TID  . predniSONE  20 mg Oral TID  . vancomycin  1,000 mg Intravenous To SS-Surg   [name], MD, MPH Internal Medicine PGY-2  12/21/2014, 7:59 AM

## 2014-12-21 NOTE — Consult Note (Signed)
Referring Physician: Baxter Flattery     Chief Complaint: Left sided weakness  HPI: [name] is an 39 y.o. female who was with family in the room and had a heated discussion.  Afterward patient became verbally less responsive.  When evaluated by he primary team was felt to have a left facial droop, right eye deviation and left sided weakness.  Code stroke was called at that time.  Initial NIHSS of 4.    Date last known well: Date: 12/21/2014 Time last known well: Time: 10:30 tPA Given: No: Resolution of symptoms  Past Medical History  Diagnosis Date  . Hypertension   . Cardiac arrhythmia   . SVT (supraventricular tachycardia)     "today, last week, 2 wk ago; maybe 1 month ago, etc; started w/in last 3-4 yrs"(07/20/2012)  . Fainting     "~ 1 month ago; probably related to SVT" (07/20/2012)  . Heart murmur     "small" (07/20/2012)  . Shortness of breath     "related to SVT episodes" (07/20/2012)  . Anemia     "today" (07/20/2012)  . Chest pain at rest     "related to SVT" (07/20/2012)  . Sleep apnea     "don't wear mask" (07/20/2012)  . Daily headache   . Anxiety   . Lupus (systemic lupus erythematosus)   . Renal disorder   . Chronic renal failure, stage 4 (severe)   . GERD (gastroesophageal reflux disease)   . Fibromyalgia   . Irritable bowel syndrome (IBS)     Past Surgical History  Procedure Laterality Date  . Appendectomy  2011  . Tubal ligation  2010  . Cesarean section  2001; 2010  . Cervical biopsy  2013  . Ablation      07/21/12 Dr. Cristopher Peru   . Peripheral vascular catheterization N/A 12/14/2014    Procedure: Upper Extremity Venography;  Surgeon: Elam Dutch, MD;  Location: [facility];  Service: Cardiovascular;  Laterality: N/A;  . Insertion hybrid anteriovenous gortex graft Left 12/18/2014    Procedure: INSERTION GORE HYBRID ARTERIOVENOUS GRAFT LEFT AXILLO-BRACHIAL.;  Surgeon: Serafina Mitchell, MD;  Location: [facility] OR;  Service: Vascular;  Laterality: Left;  .  Ligation of arteriovenous  fistula Left 12/19/2014    Procedure: LIGATION OF FISTULA;  Surgeon: Elam Dutch, MD;  Location: [facility];  Service: Vascular;  Laterality: Left;    Family History  Problem Relation Age of Onset  . Cancer Mother     breast and ovarian  . BRCA 1/2 Sister    Social History:  reports that she has never smoked. She has never used smokeless tobacco. She reports that she does not drink alcohol or use illicit drugs.  Allergies:  Allergies  Allergen Reactions  . Food Swelling    Red peppers    Medications:  I have reviewed the patient's current medications. Scheduled: . sodium chloride   Intravenous Once  . amLODipine  10 mg Oral Daily  . atorvastatin  20 mg Oral q1800  . calcium acetate  1,334 mg Oral TID WC  . cyclobenzaprine  10 mg Oral QHS  . [START ON 01/03/2015] darbepoetin (ARANESP) injection - DIALYSIS  100 mcg Intravenous Q Thu-HD  . famotidine  20 mg Oral Daily  . folic acid  1 mg Oral Daily  . hydrALAZINE  50 mg Oral 3 times per day  . isosorbide mononitrate  30 mg Oral Daily  . metoprolol succinate  100 mg Oral Daily  . multivitamin  1 tablet Oral QHS  . mycophenolate  1,000 mg Oral BID  . nystatin   Topical TID  . predniSONE  20 mg Oral TID  . vancomycin  1,000 mg Intravenous To SS-Surg    ROS: History obtained from the patient  General ROS: negative for - chills, fatigue, fever, night sweats, weight gain or weight loss Psychological ROS: depression Ophthalmic ROS: negative for - blurry vision, double vision, eye pain or loss of vision ENT ROS: negative for - epistaxis, nasal discharge, oral lesions, sore throat, tinnitus or vertigo Allergy and Immunology ROS: negative for - hives or itchy/watery eyes Hematological and Lymphatic ROS: negative for - bleeding problems, bruising or swollen lymph nodes Endocrine ROS: negative for - galactorrhea, hair pattern changes, polydipsia/polyuria or temperature intolerance Respiratory ROS: negative  for - cough, hemoptysis, shortness of breath or wheezing Cardiovascular ROS: negative for - chest pain, dyspnea on exertion, edema or irregular heartbeat Gastrointestinal ROS: negative for - abdominal pain, diarrhea, hematemesis, nausea/vomiting or stool incontinence Genito-Urinary ROS: negative for - dysuria, hematuria, incontinence or urinary frequency/urgency Musculoskeletal ROS: left arm pain Neurological ROS: as noted in HPI Dermatological ROS: left arm ecchymosis  Physical Examination: Blood pressure 166/88, pulse 128, temperature 98.5 F (36.9 C), temperature source Oral, resp. rate 18, height 5' 3"  (1.6 m), weight 104.2 kg (229 lb 11.5 oz), last menstrual period 11/17/2014, SpO2 100 %.  GEN- Patient tearful HEENT-  Normocephalic, no lesions, without obvious abnormality.  Normal external eye and conjunctiva.  Normal TM's bilaterally.  Normal auditory canals and external ears. Normal external nose, mucus membranes and septum.  Normal pharynx. Cardiovascular- S1, S2 normal, pulses palpable throughout   Lungs- chest clear, no wheezing, rales, normal symmetric air entry Abdomen- soft, non-tender; bowel sounds normal; no masses,  no organomegaly Extremities- bruising on left arm Lymph-no adenopathy palpable Musculoskeletal-left arm tenderness Skin-bruising left arm  Neurological Examination Mental Status: Alert, oriented, thought content appropriate.  Speech fluent without evidence of aphasia.  Able to follow 3 step commands without difficulty. Cranial Nerves: II: Discs flat bilaterally; Visual fields grossly normal, pupils equal, round, reactive to light and accommodation III,IV, VI: ptosis not present, extra-ocular motions intact bilaterally V,VII: smile symmetric, facial light touch sensation normal bilaterally VIII: hearing normal bilaterally IX,X: gag reflex present XI: bilateral shoulder shrug XII: midline tongue extension Motor: Patient gives no resistance with upper  extremity testing but has no drift and able to maintain arms at 90 degrees without difficulty.  Gives little resistance in lower extremity testing as well but no focal weakness noted.   Sensory: Pinprick and light touch intact throughout, bilaterally Deep Tendon Reflexes: 2+ and symmetric throughout Plantars: Right: downgoing   Left: downgoing Cerebellar: normal finger-to-nose normal heel-to-shin testing bilaterally Gait: not tested due to safety concerns    Laboratory Studies:  Basic Metabolic Panel:  Recent Labs Lab 12/17/14 0902 12/18/14 0455 12/19/14 0541 12/20/14 0410 12/21/14 0500  NA 137 135 134* 133* 133*  K 4.8 4.3 4.3 4.3 4.0  CL 101 98* 98* 98* 98*  CO2 22 27 24 23 27   GLUCOSE 133* 136* 145* 165* 133*  BUN 98* 56* 76* 85* 44*  CREATININE 6.75* 4.65* 5.70* 5.99* 4.16*  CALCIUM 8.5* 8.2* 8.0* 8.0* 7.7*  PHOS 6.7* 5.6* 6.6* 6.7* 4.8*    Liver Function Tests:  Recent Labs Lab 12/17/14 0902 12/18/14 0455 12/19/14 0541 12/20/14 0410 12/21/14 0500  ALBUMIN 2.8* 2.9* 2.8* 2.6* 2.4*   No results for input(s): LIPASE, AMYLASE in the last 168  hours. No results for input(s): AMMONIA in the last 168 hours.  CBC:  Recent Labs Lab 12/16/14 0457 12/16/14 1936 12/17/14 0903 12/18/14 0455 12/20/14 0810 12/21/14 0500  WBC 9.0 8.9 7.2 5.8 3.4* 2.9*  NEUTROABS 8.7*  --   --   --   --   --   HGB 6.9* 9.8* 9.5* 8.8* 7.4* 6.7*  HCT 20.3* 28.9* 27.4* 25.6* 21.6* 20.3*  MCV 88.3 87.8 87.3 88.0 89.3 90.6  PLT 139* 146* 154 110* 115* 72*    Cardiac Enzymes: No results for input(s): CKTOTAL, CKMB, CKMBINDEX, TROPONINI in the last 168 hours.  BNP: Invalid input(s): POCBNP  CBG:  Recent Labs Lab 12/21/14 1138  GLUCAP 117*    Microbiology: Results for orders placed or performed during the hospital encounter of 12/03/14  Urine culture     Status: None   Collection Time: 12/03/14  1:44 PM  Result Value Ref Range Status   Specimen Description URINE, CLEAN  CATCH  Final   Special Requests Normal  Final   Culture >=100,000 COLONIES/mL KLEBSIELLA PNEUMONIAE  Final   Report Status 12/06/2014 FINAL  Final   Organism ID, Bacteria KLEBSIELLA PNEUMONIAE  Final      Susceptibility   Klebsiella pneumoniae - MIC*    AMPICILLIN 4 RESISTANT Resistant     CEFAZOLIN <=4 SENSITIVE Sensitive     CEFTRIAXONE <=1 SENSITIVE Sensitive     CIPROFLOXACIN <=0.25 SENSITIVE Sensitive     GENTAMICIN <=1 SENSITIVE Sensitive     IMIPENEM <=0.25 SENSITIVE Sensitive     NITROFURANTOIN 64 INTERMEDIATE Intermediate     TRIMETH/SULFA <=20 SENSITIVE Sensitive     AMPICILLIN/SULBACTAM 4 SENSITIVE Sensitive     PIP/TAZO 8 SENSITIVE Sensitive     * >=100,000 COLONIES/mL KLEBSIELLA PNEUMONIAE  Surgical pcr screen     Status: None   Collection Time: 12/18/14  7:56 AM  Result Value Ref Range Status   MRSA, PCR NEGATIVE NEGATIVE Final   Staphylococcus aureus NEGATIVE NEGATIVE Final    Comment:        The Xpert SA Assay (FDA approved for NASAL specimens in patients over 40 years of age), is one component of a comprehensive surveillance program.  Test performance has been validated by [facility] for patients greater than or equal to 64 year old. It is not intended to diagnose infection nor to guide or monitor treatment.     Coagulation Studies: No results for input(s): LABPROT, INR in the last 72 hours.  Urinalysis: No results for input(s): COLORURINE, LABSPEC, PHURINE, GLUCOSEU, HGBUR, BILIRUBINUR, KETONESUR, PROTEINUR, UROBILINOGEN, NITRITE, LEUKOCYTESUR in the last 168 hours.  Invalid input(s): APPERANCEUR  Lipid Panel:    Component Value Date/Time   CHOL 88 07/21/2012 0615   TRIG 173* 07/21/2012 0615   HDL 17* 07/21/2012 0615   CHOLHDL 5.2 07/21/2012 0615   VLDL 35 07/21/2012 0615   LDLCALC 36 07/21/2012 0615    HgbA1C: No results found for: HGBA1C  Urine Drug Screen:     Component Value Date/Time   LABOPIA NONE DETECTED 07/21/2012 0215    COCAINSCRNUR NONE DETECTED 07/21/2012 0215   LABBENZ NONE DETECTED 07/21/2012 0215   AMPHETMU NONE DETECTED 07/21/2012 0215   THCU NONE DETECTED 07/21/2012 0215   LABBARB NONE DETECTED 07/21/2012 0215    Alcohol Level: No results for input(s): ETH in the last 168 hours.   Imaging: Ct Head Wo Contrast  12/21/2014   CLINICAL DATA:  Code stroke, headache, eye pain, left-sided weakness  EXAM: CT HEAD WITHOUT  CONTRAST  TECHNIQUE: Contiguous axial images were obtained from the base of the skull through the vertex without intravenous contrast.  COMPARISON:  None.  FINDINGS: No evidence of parenchymal hemorrhage or extra-axial fluid collection. No mass lesion, mass effect, or midline shift.  No CT evidence of acute infarction.  Cerebral volume is within normal limits.  No ventriculomegaly.  Partial opacification of the right sphenoid sinus. Visualized paranasal sinuses are otherwise essentially clear. The mastoid air cells are unopacified.  No evidence of calvarial fracture.  IMPRESSION: Normal head CT.  These results were called by telephone at the time of interpretation on 12/21/2014 at 12:04 pm to Dr. Alexis Goodell, who verbally acknowledged these results.   Electronically Signed   By: Julian Hy M.D.   On: 12/21/2014 12:06    Assessment: 39 y.o. female with multiple medical problems and a prolonged hospitalization who today developed left sided weakness and right eye deviation.  Patient now appears to be back to baseline.  Head CT personally reviewed and shows no acute changes.  Possible TIA versus stroke.  Further work up recommended.    Stroke Risk Factors - hypertension  Plan: 1. HgbA1c, fasting lipid panel 2. MRI, MRA  of the brain without contrast 3. PT consult, OT consult, Speech consult 4. Echocardiogram 5. Carotid dopplers 6. Prophylactic therapy-Antiplatelet med: Aspirin - dose 81 mg 7. NPO until RN stroke swallow screen 8. Telemetry monitoring 9. Frequent neuro  checks   Alexis Goodell, MD [facility] [phone] 12/21/2014, 12:32 PM

## 2014-12-22 ENCOUNTER — Inpatient Hospital Stay (INDEPENDENT_AMBULATORY_CARE_PROVIDER_SITE_OTHER): Payer: Medicaid Other

## 2014-12-22 DIAGNOSIS — R319 Hematuria, unspecified: Secondary | ICD-10-CM

## 2014-12-22 DIAGNOSIS — I6789 Other cerebrovascular disease: Secondary | ICD-10-CM

## 2014-12-22 DIAGNOSIS — I63511 Cerebral infarction due to unspecified occlusion or stenosis of right middle cerebral artery: Secondary | ICD-10-CM

## 2014-12-22 LAB — RENAL FUNCTION PANEL
ALBUMIN: 2.6 g/dL — AB (ref 3.5–5.0)
Anion gap: 11 (ref 5–15)
BUN: 65 mg/dL — AB (ref 6–20)
CO2: 26 mmol/L (ref 22–32)
CREATININE: 5.3 mg/dL — AB (ref 0.44–1.00)
Calcium: 8.2 mg/dL — ABNORMAL LOW (ref 8.9–10.3)
Chloride: 100 mmol/L — ABNORMAL LOW (ref 101–111)
GFR, EST AFRICAN AMERICAN: 11 mL/min — AB (ref >60)
GFR, EST NON AFRICAN AMERICAN: 9 mL/min — AB (ref >60)
Glucose, Bld: 125 mg/dL — ABNORMAL HIGH (ref 65–99)
PHOSPHORUS: 6.2 mg/dL — AB (ref 2.5–4.6)
POTASSIUM: 4.3 mmol/L (ref 3.5–5.1)
Sodium: 137 mmol/L (ref 135–145)

## 2014-12-22 LAB — CBC
HCT: 25.1 % — ABNORMAL LOW (ref 36.0–46.0)
Hemoglobin: 8.3 g/dL — ABNORMAL LOW (ref 12.0–15.0)
MCH: 29.3 pg (ref 26.0–34.0)
MCHC: 33.1 g/dL (ref 30.0–36.0)
MCV: 88.7 fL (ref 78.0–100.0)
PLATELETS: 89 10*3/uL — AB (ref 150–400)
RBC: 2.83 MIL/uL — AB (ref 3.87–5.11)
RDW: 14.8 % (ref 11.5–15.5)
WBC: 3.5 10*3/uL — AB (ref 4.0–10.5)

## 2014-12-22 LAB — TYPE AND SCREEN
ABO/RH(D): A POS
ANTIBODY SCREEN: NEGATIVE
UNIT DIVISION: 0

## 2014-12-22 MED ORDER — ASPIRIN 81 MG PO CHEW
81.0000 mg | CHEWABLE_TABLET | Freq: Every day | ORAL | Status: DC
  Administered 2014-12-22 – 2014-12-25 (×4): 81 mg via ORAL
  Filled 2014-12-22 (×5): qty 1

## 2014-12-22 NOTE — Progress Notes (Signed)
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 Patient's left sided deficit and speech slurring resolved within 1-2 hours after we noticed her deficit yesterday. She does not have any current complain other than being frustrated about being in the hospital. No headache,cp,sob, fever,chills.    Objective: Vital signs in last 24 hours: Filed Vitals:   12/22/14 0830 12/22/14 0900 12/22/14 0930 12/22/14 1000  BP: 140/84 143/91 152/89 158/92  Pulse: 87 91 86 88  Temp:      TempSrc:      Resp: 15 17 15 16   Height:      Weight:      SpO2:       Weight change:   Intake/Output Summary (Last 24 hours) at 12/22/14 1038 Last data filed at 12/22/14 0636  Gross per 24 hour  Intake   1414 ml  Output      0 ml  Net   1414 ml   GENERAL- awake, appears tired.  HEENT- [place]/AT, no facial droop, EOMI, PERRL CARDIAC- RRR, no murmurs, rubs or gallops. RESP- CTAB, no wheezes or crackles. ABDOMEN- Nontender, not distended, bruising present laterally above left lower waistline EXTREMITIES- 2+ palpable distal pulses, no pedal edema, dry dressings intact around R fem catheter, ecchymoses around LUE surgical site SKIN- Warm, dry, ecchymoses around LIJ incision site NEURO-CN II-XII intact, no gross deficit noted. Has 5/5 str both upper and lower ext's today.  PSYCH- normal speech.   Lab Results: Basic Metabolic Panel:  Recent Labs Lab 12/21/14 0500 12/22/14 0441  NA 133* 137  K 4.0 4.3  CL 98* 100*  CO2 27 26  GLUCOSE 133* 125*  BUN 44* 65*  CREATININE 4.16* 5.30*  CALCIUM 7.7* 8.2*  PHOS 4.8* 6.2*   Liver Function Tests:  Recent Labs Lab 12/21/14 0500 12/22/14 0441  ALBUMIN 2.4* 2.6*   No results for input(s): LIPASE, AMYLASE in the last 168 hours. No results for input(s): AMMONIA in the last 168 hours. CBC:  Recent Labs Lab 12/16/14 0457  12/21/14 0500 12/21/14 1950  12/22/14 0725  WBC 9.0  < > 2.9*  --  3.5*  NEUTROABS 8.7*  --   --   --   --   HGB 6.9*  < > 6.7* 8.5* 8.3*  HCT 20.3*  < > 20.3* 25.5* 25.1*  MCV 88.3  < > 90.6  --  88.7  PLT 139*  < > 72*  --  89*  < > = values in this interval not displayed. Cardiac Enzymes: No results for input(s): CKTOTAL, CKMB, CKMBINDEX, TROPONINI in the last 168 hours. BNP: No results for input(s): PROBNP in the last 168 hours. D-Dimer: No results for input(s): DDIMER in the last 168 hours. CBG:  Recent Labs Lab 12/21/14 1138  GLUCAP 117*   Hemoglobin A1C: No results for input(s): HGBA1C in the last 168 hours. Fasting Lipid Panel:  Recent Labs Lab 12/21/14 1638  CHOL 113  HDL 53  LDLCALC 39  TRIG 107  CHOLHDL 2.1   Thyroid Function Tests: No results for input(s): TSH, T4TOTAL, FREET4, T3FREE, THYROIDAB in the last 168 hours. Coagulation: No results for input(s): LABPROT, INR in the last 168 hours. Anemia Panel:  Recent Labs Lab 12/15/14 1050  VITAMINB12 480  FOLATE 4.2*  FERRITIN 139  TIBC 238*  IRON 89  RETICCTPCT 1.7   Urine Drug Screen: Drugs of Abuse     Component Value  Date/Time   LABOPIA NONE DETECTED 07/21/2012 0215   COCAINSCRNUR NONE DETECTED 07/21/2012 0215   LABBENZ NONE DETECTED 07/21/2012 0215   AMPHETMU NONE DETECTED 07/21/2012 0215   THCU NONE DETECTED 07/21/2012 0215   LABBARB NONE DETECTED 07/21/2012 0215    Alcohol Level: No results for input(s): ETH in the last 168 hours. Urinalysis: No results for input(s): COLORURINE, LABSPEC, PHURINE, GLUCOSEU, HGBUR, BILIRUBINUR, KETONESUR, PROTEINUR, UROBILINOGEN, NITRITE, LEUKOCYTESUR in the last 168 hours.  Invalid input(s): APPERANCEUR   Micro Results: Recent Results (from the past 240 hour(s))  Surgical pcr screen     Status: None   Collection Time: 12/18/14  7:56 AM  Result Value Ref Range Status   MRSA, PCR NEGATIVE NEGATIVE Final   Staphylococcus aureus NEGATIVE NEGATIVE Final    Comment:        The  Xpert SA Assay (FDA approved for NASAL specimens in patients over 48 years of age), is one component of a comprehensive surveillance program.  Test performance has been validated by [facility] for patients greater than or equal to 54 year old. It is not intended to diagnose infection nor to guide or monitor treatment.    Studies/Results: Ct Head Wo Contrast  12/21/2014   CLINICAL DATA:  Code stroke, headache, eye pain, left-sided weakness  EXAM: CT HEAD WITHOUT CONTRAST  TECHNIQUE: Contiguous axial images were obtained from the base of the skull through the vertex without intravenous contrast.  COMPARISON:  None.  FINDINGS: No evidence of parenchymal hemorrhage or extra-axial fluid collection. No mass lesion, mass effect, or midline shift.  No CT evidence of acute infarction.  Cerebral volume is within normal limits.  No ventriculomegaly.  Partial opacification of the right sphenoid sinus. Visualized paranasal sinuses are otherwise essentially clear. The mastoid air cells are unopacified.  No evidence of calvarial fracture.  IMPRESSION: Normal head CT.  These results were called by telephone at the time of interpretation on 12/21/2014 at 12:04 pm to Dr. Alexis Goodell, who verbally acknowledged these results.   Electronically Signed   By: Julian Hy M.D.   On: 12/21/2014 12:06   Mr Jodene Nam Head Wo Contrast  12/21/2014   CLINICAL DATA:  39 year old female with altered mental status, left facial droop and left side weakness. Code stroke. Initial encounter.  EXAM: MRI HEAD WITHOUT CONTRAST  MRA HEAD WITHOUT CONTRAST  TECHNIQUE: Multiplanar, multiecho pulse sequences of the brain and surrounding structures were obtained without intravenous contrast. Angiographic images of the head were obtained using MRA technique without contrast.  COMPARISON:  Head CT without contrast 1200 hr today.  FINDINGS: MRI HEAD FINDINGS  Cortical and subcortical white matter restricted diffusion in the anterior right MCA  territory including involvement of the operculum and anterior 2/3 of the insula. Cortical involvement tracks cephalad toward the vertex, involving some of the pre motor area (series 3, image 33). There is early T2 and FLAIR hyperintensity in the insula. There are asymmetrically visible right MCA sylvian branches. See MRA findings below.  No contralateral left hemisphere or posterior fossa restricted diffusion. Major intracranial vascular flow voids are preserved.  No associated hemorrhage or mass effect. Partially empty sella. [place] Cables and white matter signal elsewhere is within normal limits. No midline shift, mass effect, evidence of mass lesion, ventriculomegaly, extra-axial collection or acute intracranial hemorrhage. Cervicomedullary junction and pituitary are within normal limits. Negative visualized cervical spine.  Trace mastoid fluid. Mild mucosal thickening and/or mucous retention cysts in the maxillary and right sphenoid sinuses. Rightward gaze deviation. Negative  scalp soft tissues. Visualized bone marrow signal is within normal limits.  MRA HEAD FINDINGS  Antegrade flow in the posterior circulation with codominant distal vertebral arteries. Patent PICA origins. Incidental fenestrated basilar artery. No basilar stenosis. AICA, SCA, and PCA origins are normal. Bilateral PCA branches are within normal limits.  Antegrade flow in both ICA siphons. No siphon stenosis or irregularity. Ophthalmic artery origins are within normal limits. Posterior communicating arteries are diminutive or absent. Carotid termini are patent. MCA and ACA origins are normal. Diminutive or absent anterior communicating artery. Visualized bilateral ACA branches and visualized left MCA branches are within normal limits.  The right MCA M1 segment and bifurcation are patent. However, there is occlusion of the anterior right MCA M2 division just beyond its origin (series 603, image 4). The other right MCA branches appear within normal limits.   IMPRESSION: 1. 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 No hemorrhage or mass effect. 2. Otherwise negative intracranial MRI and MRA. Study discussed by telephone with Dr. Armida Sans on 12/21/2014 at 1939 hrs.   Electronically Signed   By: Genevie Ann M.D.   On: 12/21/2014 19:46   Mr Brain Wo Contrast  12/21/2014   CLINICAL DATA:  39 year old female with altered mental status, left facial droop and left side weakness. Code stroke. Initial encounter.  EXAM: MRI HEAD WITHOUT CONTRAST  MRA HEAD WITHOUT CONTRAST  TECHNIQUE: Multiplanar, multiecho pulse sequences of the brain and surrounding structures were obtained without intravenous contrast. Angiographic images of the head were obtained using MRA technique without contrast.  COMPARISON:  Head CT without contrast 1200 hr today.  FINDINGS: MRI HEAD FINDINGS  Cortical and subcortical white matter restricted diffusion in the anterior right MCA territory including involvement of the operculum and anterior 2/3 of the insula. Cortical involvement tracks cephalad toward the vertex, involving some of the pre motor area (series 3, image 33). There is early T2 and FLAIR hyperintensity in the insula. There are asymmetrically visible right MCA sylvian branches. See MRA findings below.  No contralateral left hemisphere or posterior fossa restricted diffusion. Major intracranial vascular flow voids are preserved.  No associated hemorrhage or mass effect. Partially empty sella. [place] Cables and white matter signal elsewhere is within normal limits. No midline shift, mass effect, evidence of mass lesion, ventriculomegaly, extra-axial collection or acute intracranial hemorrhage. Cervicomedullary junction and pituitary are within normal limits. Negative visualized cervical spine.  Trace mastoid fluid. Mild mucosal thickening and/or mucous retention cysts in the maxillary and right sphenoid sinuses. Rightward gaze deviation. Negative scalp soft  tissues. Visualized bone marrow signal is within normal limits.  MRA HEAD FINDINGS  Antegrade flow in the posterior circulation with codominant distal vertebral arteries. Patent PICA origins. Incidental fenestrated basilar artery. No basilar stenosis. AICA, SCA, and PCA origins are normal. Bilateral PCA branches are within normal limits.  Antegrade flow in both ICA siphons. No siphon stenosis or irregularity. Ophthalmic artery origins are within normal limits. Posterior communicating arteries are diminutive or absent. Carotid termini are patent. MCA and ACA origins are normal. Diminutive or absent anterior communicating artery. Visualized bilateral ACA branches and visualized left MCA branches are within normal limits.  The right MCA M1 segment and bifurcation are patent. However, there is occlusion of the anterior right MCA M2 division just beyond its origin (series 603, image 4). The other right MCA branches appear within normal limits.  IMPRESSION: 1. 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 No  hemorrhage or mass effect. 2. Otherwise negative intracranial MRI and MRA. Study discussed by telephone with Dr. Armida Sans on 12/21/2014 at 1939 hrs.   Electronically Signed   By: Genevie Ann M.D.   On: 12/21/2014 19:46   Medications: I have reviewed the patient's current medications. Scheduled Meds: . sodium chloride   Intravenous Once  . aspirin  81 mg Oral Daily  . atorvastatin  20 mg Oral q1800  . calcium acetate  1,334 mg Oral TID WC  . [START ON 12/24/2014] cefUROXime (ZINACEF)  IV  1.5 g Intravenous To SS-Surg  . cyclobenzaprine  10 mg Oral QHS  . [START ON 01/03/2015] darbepoetin (ARANESP) injection - DIALYSIS  100 mcg Intravenous Q Thu-HD  . famotidine  20 mg Oral Daily  . folic acid  1 mg Oral Daily  . isosorbide mononitrate  30 mg Oral Daily  . metoprolol succinate  100 mg Oral Daily  . multivitamin  1 tablet Oral QHS  . mycophenolate  1,000 mg Oral BID  .  nystatin   Topical TID  . predniSONE  20 mg Oral TID   Continuous Infusions: . sodium chloride 20 mL/hr at 12/18/14 1615   PRN Meds:.acetaminophen, fentaNYL (SUBLIMAZE) injection, gelatin adsorbable, iohexol, morphine injection, ondansetron (ZOFRAN) IV, ondansetron, oxyCODONE **OR** oxyCODONE, oxyCODONE-acetaminophen, sodium chloride Assessment/Plan:  R MCA ant dvision M2 occlusion causing patchy Right MCA infarct - had slurred speech, right sided deficit, and left gaze preference yesterday. These all resolved 1-2 hours later. No longer has any neuro deficit currently. - appreciate neurology recs. Carotid doppler normal. hgba1c pending. LDL normal (39 - already on Lipitor 20mg  daily). ECHO pending.  - started asa 81 mg. Have to be careful with her on going bleeding and thrombocytopenia. 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 Patient on TTS dialysis at this time. - Continue prednisone 20mg  PO TID - Continue Cellcept 1000mg  PO BID - Continue zofran 4mg  ODT q6hrs PRN - Continue PhosLo 1334mg  TIDWM - Follow daily renal function panel, CBC - Nephrology following pt, recs appreciated - Plan was left femoral graft placement for HD access, this may have to be delayed with her recent CVA. Will f/up vascular recs.  Anemia: 2/2 to renal impairment + acute blood loss. AOCD baseline 10.9 on admission. Hgb decreased to 6.7 yesterday but improved to 8.5 with 1 unit pRBC. - Supplement folic acid - Continue transfuse PRN to Hgb > 7 - Follow qAM CBCs - Pt to supplement EPO per nephrology  HTN:. - HOLD Amlodipine 10mg  PO for permissive HTN - HOLD Hydralazine 50mg  PO TID for permissive HTN - Continue Imdur 30mg  PO and Continue Metoprolol succinate 100mg  PO daily as does have perinephric hematoma which may worsen with very high BP even though we are allowing some permissive  HTN.  Candidal rash: Resolved  Thrombocytopenia and qualitative platelet dysfunction  plt 72 >> 89 today. No obviously increased external blood loss. Pt already received 2x doses DDAVP early in admission. HIT and DIC panels negative. - No heparin  Pain: Pain previously associated with renal biopsy sites. Now has some pain at surgical site, controlled - Percocet 5-325mg  q6hrs PRN - Flexeril 10mg  qHS PRN  SVT: s/p ablation 2014, has some recurrent palpitations in recent months, currently holter monitor on board. HLD: Atorvastatin 20mg  PO Hyperkalemia: Resolved  FEN: Renal diet, fluid restricted DVT ppx: SCDs FULL CODE  Dispo: Disposition  deferred until improvement in clinical condition. Patient status will depend on vascular access and HD arrangement.  The patient does have a current PCP Lin Landsman, MD) and does not need an [facility] hospital follow-up appointment after discharge.  The patient does not have transportation limitations that hinder transportation to clinic appointments.   LOS: 19 days   Dellia Nims, MD 12/22/2014, 10:38 AM

## 2014-12-22 NOTE — Procedures (Signed)
I have personally attended this patient's dialysis session.   Well tolerated with 2 L off via R thigh TDC.   K 4.3 No heparin Pre weight 101.4 Next HD will be Tuesday (has TTS outpt spot at [facility] once stable enough for discharge)  Jamal Maes, MD [place] Pager 12/22/2014, 11:23 AM

## 2014-12-22 NOTE — Progress Notes (Signed)
  Echocardiogram 2D Echocardiogram has been performed.  [name] 12/22/2014, 1:04 PM

## 2014-12-22 NOTE — Progress Notes (Signed)
Patient ID: [name], female   DOB: Sep 06, 1975, 39 y.o.   MRN: [id number]  [facility] Progress Note   Assessment/ Plan:   1. 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 Currently on TThS schedule, has outpatient HD spot. Left thigh graft surgery canceled yesterday due to low hemoglobin. Rescheduled for Monday. Continue Cellcept to 1,000 mg BID and keep Prednisone 20 mg TID.  2. 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 Found to have a right MCA branch occlusion and right MCA infarct on MRI brain. Work up ongoing. On statin and baby aspirin.  3. Hypertension: BP [id number] systolic. Allowing for permissive HTN due to stroke. Continue Norvasc 10 mg daily, Hydralazine 50 mg Q8H, and Toprol-XL 100 mg daily.  4. Anemia: Hgb improved to 8.5 from 6.7 s/p 1 unit PRBCs yesterday.  On Aranesp. No bleeding from catheter.  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 PTH level 71. Will need to have PTH monitoring, check in 3 months.   Subjective:   Right after we saw patient yesterday, the primary team evaluated her and noticed she developed slurred speech, left facial droop, and right eye deviation. Code stroke called. Symptoms resolved by time Neurology saw her. MRI shows evidence of a right MCA branch occlusion and associated right MCA infarct.   This morning I explained results of MRI to patient. She was tearful, but took news well. She denies any changes in vision, tingling/numbness, difficulty moving extremities.    Objective:   BP 141/83 mmHg  Pulse 87  Temp(Src) 98.6 F (37 C) (Oral)  Resp 15  Ht 5\' 3"  (1.6 m)  Wt 223 lb 8.7 oz (101.4 kg)  BMI 39.61 kg/m2  SpO2 99%  LMP 11/17/2014  (Approximate)  Intake/Output Summary (Last 24 hours) at 12/22/14 0807 Last data filed at 12/22/14 0636  Gross per 24 hour  Intake   1414 ml  Output      0 ml  Net   1414 ml   Weight change:   Physical Exam: Gen: alert, resting in HD bed, pleasant, NAD  CVS: RRR, no m/g/r Resp: CTA bilaterally, breaths non-labored Abd: BS+, soft, obese, non-tender Ext: trace LE edema. Right femoral tunneled dialysis catheter without bleeding Neuro: alert and oriented x 4. EOMI. Smile symmetric. Tongue midline. Strength symmetric and 5/5 in upper and lower extremities.   Imaging: Ct Head Wo Contrast  12/21/2014   CLINICAL DATA:  Code stroke, headache, eye pain, left-sided weakness  EXAM: CT HEAD WITHOUT CONTRAST  TECHNIQUE: Contiguous axial images were obtained from the base of the skull through the vertex without intravenous contrast.  COMPARISON:  None.  FINDINGS: No evidence of parenchymal hemorrhage or extra-axial fluid collection. No mass lesion, mass effect, or midline shift.  No CT evidence of acute infarction.  Cerebral volume is within normal limits.  No ventriculomegaly.  Partial opacification of the right sphenoid sinus. Visualized paranasal sinuses are otherwise essentially clear. The mastoid air cells are unopacified.  No evidence of calvarial fracture.  IMPRESSION: Normal head CT.  These results were called by telephone at the time of interpretation on 12/21/2014 at 12:04 pm to Dr. Alexis Goodell, who verbally acknowledged these results.   Electronically Signed   By: Julian Hy M.D.   On:  12/21/2014 12:06   Mr Jodene Nam Head Wo Contrast  12/21/2014   CLINICAL DATA:  39 year old female with altered mental status, left facial droop and left side weakness. Code stroke. Initial encounter.  EXAM: MRI HEAD WITHOUT CONTRAST  MRA HEAD WITHOUT CONTRAST  TECHNIQUE: Multiplanar, multiecho pulse sequences of the brain and surrounding structures were obtained without intravenous contrast. Angiographic images  of the head were obtained using MRA technique without contrast.  COMPARISON:  Head CT without contrast 1200 hr today.  FINDINGS: MRI HEAD FINDINGS  Cortical and subcortical white matter restricted diffusion in the anterior right MCA territory including involvement of the operculum and anterior 2/3 of the insula. Cortical involvement tracks cephalad toward the vertex, involving some of the pre motor area (series 3, image 33). There is early T2 and FLAIR hyperintensity in the insula. There are asymmetrically visible right MCA sylvian branches. See MRA findings below.  No contralateral left hemisphere or posterior fossa restricted diffusion. Major intracranial vascular flow voids are preserved.  No associated hemorrhage or mass effect. Partially empty sella. Pearline Cables and white matter signal elsewhere is within normal limits. No midline shift, mass effect, evidence of mass lesion, ventriculomegaly, extra-axial collection or acute intracranial hemorrhage. Cervicomedullary junction and pituitary are within normal limits. Negative visualized cervical spine.  Trace mastoid fluid. Mild mucosal thickening and/or mucous retention cysts in the maxillary and right sphenoid sinuses. Rightward gaze deviation. Negative scalp soft tissues. Visualized bone marrow signal is within normal limits.  MRA HEAD FINDINGS  Antegrade flow in the posterior circulation with codominant distal vertebral arteries. Patent PICA origins. Incidental fenestrated basilar artery. No basilar stenosis. AICA, SCA, and PCA origins are normal. Bilateral PCA branches are within normal limits.  Antegrade flow in both ICA siphons. No siphon stenosis or irregularity. Ophthalmic artery origins are within normal limits. Posterior communicating arteries are diminutive or absent. Carotid termini are patent. MCA and ACA origins are normal. Diminutive or absent anterior communicating artery. Visualized bilateral ACA branches and visualized left MCA branches are within  normal limits.  The right MCA M1 segment and bifurcation are patent. However, there is occlusion of the anterior right MCA M2 division just beyond its origin (series 603, image 4). The other right MCA branches appear within normal limits.  IMPRESSION: 1. 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 No hemorrhage or mass effect. 2. Otherwise negative intracranial MRI and MRA. Study discussed by telephone with Dr. Armida Sans on 12/21/2014 at 1939 hrs.   Electronically Signed   By: Genevie Ann M.D.   On: 12/21/2014 19:46   Mr Brain Wo Contrast  12/21/2014   CLINICAL DATA:  39 year old female with altered mental status, left facial droop and left side weakness. Code stroke. Initial encounter.  EXAM: MRI HEAD WITHOUT CONTRAST  MRA HEAD WITHOUT CONTRAST  TECHNIQUE: Multiplanar, multiecho pulse sequences of the brain and surrounding structures were obtained without intravenous contrast. Angiographic images of the head were obtained using MRA technique without contrast.  COMPARISON:  Head CT without contrast 1200 hr today.  FINDINGS: MRI HEAD FINDINGS  Cortical and subcortical white matter restricted diffusion in the anterior right MCA territory including involvement of the operculum and anterior 2/3 of the insula. Cortical involvement tracks cephalad toward the vertex, involving some of the pre motor area (series 3, image 33). There is early T2 and FLAIR hyperintensity in the insula. There are asymmetrically visible right MCA sylvian branches. See MRA findings below.  No contralateral left hemisphere or posterior fossa  restricted diffusion. Major intracranial vascular flow voids are preserved.  No associated hemorrhage or mass effect. Partially empty sella. Pearline Cables and white matter signal elsewhere is within normal limits. No midline shift, mass effect, evidence of mass lesion, ventriculomegaly, extra-axial collection or acute intracranial hemorrhage. Cervicomedullary junction and  pituitary are within normal limits. Negative visualized cervical spine.  Trace mastoid fluid. Mild mucosal thickening and/or mucous retention cysts in the maxillary and right sphenoid sinuses. Rightward gaze deviation. Negative scalp soft tissues. Visualized bone marrow signal is within normal limits.  MRA HEAD FINDINGS  Antegrade flow in the posterior circulation with codominant distal vertebral arteries. Patent PICA origins. Incidental fenestrated basilar artery. No basilar stenosis. AICA, SCA, and PCA origins are normal. Bilateral PCA branches are within normal limits.  Antegrade flow in both ICA siphons. No siphon stenosis or irregularity. Ophthalmic artery origins are within normal limits. Posterior communicating arteries are diminutive or absent. Carotid termini are patent. MCA and ACA origins are normal. Diminutive or absent anterior communicating artery. Visualized bilateral ACA branches and visualized left MCA branches are within normal limits.  The right MCA M1 segment and bifurcation are patent. However, there is occlusion of the anterior right MCA M2 division just beyond its origin (series 603, image 4). The other right MCA branches appear within normal limits.  IMPRESSION: 1. 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 No hemorrhage or mass effect. 2. Otherwise negative intracranial MRI and MRA. Study discussed by telephone with Dr. Armida Sans on 12/21/2014 at 1939 hrs.   Electronically Signed   By: Genevie Ann M.D.   On: 12/21/2014 19:46    Labs: BMET  Recent Labs Lab 12/17/14 0418 12/17/14 0902 12/18/14 0455 12/19/14 0541 12/20/14 0410 12/21/14 0500 12/22/14 0441  NA 132* 137 135 134* 133* 133* 137  K 4.5 4.8 4.3 4.3 4.3 4.0 4.3  CL 96* 101 98* 98* 98* 98* 100*  CO2 23 22 27 24 23 27 26   GLUCOSE 137* 133* 136* 145* 165* 133* 125*  BUN 87* 98* 56* 76* 85* 44* 65*  CREATININE 6.64* 6.75* 4.65* 5.70* 5.99* 4.16* 5.30*  CALCIUM 8.4* 8.5* 8.2* 8.0*  8.0* 7.7* 8.2*  PHOS 6.5* 6.7* 5.6* 6.6* 6.7* 4.8* 6.2*   CBC  Recent Labs Lab 12/16/14 0457  12/17/14 0903 12/18/14 0455 12/20/14 0810 12/21/14 0500 12/21/14 1950  WBC 9.0  < > 7.2 5.8 3.4* 2.9*  --   NEUTROABS 8.7*  --   --   --   --   --   --   HGB 6.9*  < > 9.5* 8.8* 7.4* 6.7* 8.5*  HCT 20.3*  < > 27.4* 25.6* 21.6* 20.3* 25.5*  MCV 88.3  < > 87.3 88.0 89.3 90.6  --   PLT 139*  < > 154 110* 115* 72*  --   < > = values in this interval not displayed.  Medications:    . sodium chloride   Intravenous Once  . atorvastatin  20 mg Oral q1800  . calcium acetate  1,334 mg Oral TID WC  . [START ON 12/24/2014] cefUROXime (ZINACEF)  IV  1.5 g Intravenous To SS-Surg  . cyclobenzaprine  10 mg Oral QHS  . [START ON 01/03/2015] darbepoetin (ARANESP) injection - DIALYSIS  100 mcg Intravenous Q Thu-HD  . famotidine  20 mg Oral Daily  . folic acid  1 mg Oral Daily  . isosorbide mononitrate  30 mg Oral Daily  . metoprolol succinate  100 mg Oral  Daily  . multivitamin  1 tablet Oral QHS  . mycophenolate  1,000 mg Oral BID  . nystatin   Topical TID  . predniSONE  20 mg Oral TID   [name], MD, MPH Internal Medicine PGY-2  12/22/2014, 8:07 AM

## 2014-12-23 DIAGNOSIS — I63411 Cerebral infarction due to embolism of right middle cerebral artery: Secondary | ICD-10-CM

## 2014-12-23 LAB — RENAL FUNCTION PANEL
ALBUMIN: 3 g/dL — AB (ref 3.5–5.0)
Anion gap: 11 (ref 5–15)
BUN: 40 mg/dL — AB (ref 6–20)
CHLORIDE: 97 mmol/L — AB (ref 101–111)
CO2: 27 mmol/L (ref 22–32)
CREATININE: 3.75 mg/dL — AB (ref 0.44–1.00)
Calcium: 8.4 mg/dL — ABNORMAL LOW (ref 8.9–10.3)
GFR, EST AFRICAN AMERICAN: 17 mL/min — AB (ref >60)
GFR, EST NON AFRICAN AMERICAN: 14 mL/min — AB (ref >60)
Glucose, Bld: 120 mg/dL — ABNORMAL HIGH (ref 65–99)
PHOSPHORUS: 4.5 mg/dL (ref 2.5–4.6)
POTASSIUM: 4 mmol/L (ref 3.5–5.1)
Sodium: 135 mmol/L (ref 135–145)

## 2014-12-23 LAB — CBC
HCT: 27.4 % — ABNORMAL LOW (ref 36.0–46.0)
Hemoglobin: 9.2 g/dL — ABNORMAL LOW (ref 12.0–15.0)
MCH: 30.2 pg (ref 26.0–34.0)
MCHC: 33.6 g/dL (ref 30.0–36.0)
MCV: 89.8 fL (ref 78.0–100.0)
PLATELETS: 73 10*3/uL — AB (ref 150–400)
RBC: 3.05 MIL/uL — AB (ref 3.87–5.11)
RDW: 14.6 % (ref 11.5–15.5)
WBC: 4.5 10*3/uL (ref 4.0–10.5)

## 2014-12-23 MED ORDER — AMLODIPINE BESYLATE 10 MG PO TABS
10.0000 mg | ORAL_TABLET | Freq: Every day | ORAL | Status: DC
  Administered 2014-12-24 – 2014-12-30 (×7): 10 mg via ORAL
  Filled 2014-12-23 (×7): qty 1

## 2014-12-23 MED ORDER — HYDRALAZINE HCL 50 MG PO TABS
50.0000 mg | ORAL_TABLET | Freq: Three times a day (TID) | ORAL | Status: DC
  Administered 2014-12-23 – 2015-01-03 (×30): 50 mg via ORAL
  Filled 2014-12-23 (×30): qty 1

## 2014-12-23 NOTE — Progress Notes (Signed)
Subjective: No complaints besides frustration at hospital stay and wants to have her access surgery. She has some oozing bleeding with large external clots present at her LUE AVG surgical site. This is nonpainful and she was unaware until pointed out during examination. Has not eaten much due to poor appetite. Denies fever, chills, nausea, vomiting, diarrhea.  Objective: Vital signs in last 24 hours: Filed Vitals:   12/22/14 1117 12/22/14 1331 12/22/14 2130 12/23/14 0504  BP: 156/96 166/90 155/87 167/91  Pulse: 84 66 97 89  Temp: 97.8 F (36.6 C) 98.3 F (36.8 C) 98.4 F (36.9 C) 98.7 F (37.1 C)  TempSrc: Oral Oral Oral Oral  Resp: 20 19 20 20   Height:      Weight: 99.5 kg (219 lb 5.7 oz)     SpO2: 98% 92% 97% 100%   Weight change:   Intake/Output Summary (Last 24 hours) at 12/23/14 0900 Last data filed at 12/23/14 0900  Gross per 24 hour  Intake    240 ml  Output   2313 ml  Net  -2073 ml   GENERAL- awake, appears tired.  HEENT- [place]/AT, no facial droop, EOMI, PERRL CARDIAC- RRR, no murmurs, rubs or gallops. RESP- CTAB, no wheezes or crackles. ABDOMEN- Nontender, not distended, bruising present laterally above left lower waistline EXTREMITIES- 2+ palpable distal pulses, no pedal edema, dry dressings intact around R fem catheter, ecchymoses around LUE surgical site with external clot and blood on gown and bed surrounding the site SKIN- Warm, dry, ecchymoses around LIJ incision site NEURO-CN II-XII intact, no gross deficit noted. LUE grip strength 3/5 on exam compared to 5/5 on RUE PSYCH- flat affect, appropriate speech  Lab Results: Basic Metabolic Panel:  Recent Labs Lab 12/22/14 0441 12/23/14 0636  NA 137 135  K 4.3 4.0  CL 100* 97*  CO2 26 27  GLUCOSE 125* 120*  BUN 65* 40*  CREATININE 5.30* 3.75*  CALCIUM 8.2* 8.4*  PHOS 6.2* 4.5   Liver Function Tests:  Recent Labs Lab 12/22/14 0441 12/23/14 0636  ALBUMIN 2.6* 3.0*   No results for input(s):  LIPASE, AMYLASE in the last 168 hours. No results for input(s): AMMONIA in the last 168 hours. CBC:  Recent Labs Lab 12/22/14 0725 12/23/14 0636  WBC 3.5* 4.5  HGB 8.3* 9.2*  HCT 25.1* 27.4*  MCV 88.7 89.8  PLT 89* 73*   Cardiac Enzymes: No results for input(s): CKTOTAL, CKMB, CKMBINDEX, TROPONINI in the last 168 hours. BNP: No results for input(s): PROBNP in the last 168 hours. D-Dimer: No results for input(s): DDIMER in the last 168 hours. CBG:  Recent Labs Lab 12/21/14 1138  GLUCAP 117*   Hemoglobin A1C: No results for input(s): HGBA1C in the last 168 hours. Fasting Lipid Panel:  Recent Labs Lab 12/21/14 1638  CHOL 113  HDL 53  LDLCALC 39  TRIG 107  CHOLHDL 2.1   Thyroid Function Tests: No results for input(s): TSH, T4TOTAL, FREET4, T3FREE, THYROIDAB in the last 168 hours. Coagulation: No results for input(s): LABPROT, INR in the last 168 hours. Anemia Panel: No results for input(s): VITAMINB12, FOLATE, FERRITIN, TIBC, IRON, RETICCTPCT in the last 168 hours. Urine Drug Screen: Drugs of Abuse     Component Value Date/Time   LABOPIA NONE DETECTED 07/21/2012 0215   COCAINSCRNUR NONE DETECTED 07/21/2012 0215   LABBENZ NONE DETECTED 07/21/2012 0215   AMPHETMU NONE DETECTED 07/21/2012 0215   THCU NONE DETECTED 07/21/2012 0215   LABBARB NONE DETECTED 07/21/2012 0215  Alcohol Level: No results for input(s): ETH in the last 168 hours. Urinalysis: No results for input(s): COLORURINE, LABSPEC, PHURINE, GLUCOSEU, HGBUR, BILIRUBINUR, KETONESUR, PROTEINUR, UROBILINOGEN, NITRITE, LEUKOCYTESUR in the last 168 hours.  Invalid input(s): APPERANCEUR   Micro Results: Recent Results (from the past 240 hour(s))  Surgical pcr screen     Status: None   Collection Time: 12/18/14  7:56 AM  Result Value Ref Range Status   MRSA, PCR NEGATIVE NEGATIVE Final   Staphylococcus aureus NEGATIVE NEGATIVE Final    Comment:        The Xpert SA Assay (FDA approved for NASAL  specimens in patients over 60 years of age), is one component of a comprehensive surveillance program.  Test performance has been validated by [facility] for patients greater than or equal to 47 year old. It is not intended to diagnose infection nor to guide or monitor treatment.    Studies/Results: Ct Head Wo Contrast  12/21/2014   CLINICAL DATA:  Code stroke, headache, eye pain, left-sided weakness  EXAM: CT HEAD WITHOUT CONTRAST  TECHNIQUE: Contiguous axial images were obtained from the base of the skull through the vertex without intravenous contrast.  COMPARISON:  None.  FINDINGS: No evidence of parenchymal hemorrhage or extra-axial fluid collection. No mass lesion, mass effect, or midline shift.  No CT evidence of acute infarction.  Cerebral volume is within normal limits.  No ventriculomegaly.  Partial opacification of the right sphenoid sinus. Visualized paranasal sinuses are otherwise essentially clear. The mastoid air cells are unopacified.  No evidence of calvarial fracture.  IMPRESSION: Normal head CT.  These results were called by telephone at the time of interpretation on 12/21/2014 at 12:04 pm to Dr. Alexis Goodell, who verbally acknowledged these results.   Electronically Signed   By: Julian Hy M.D.   On: 12/21/2014 12:06   Mr Jodene Nam Head Wo Contrast  12/21/2014   CLINICAL DATA:  39 year old female with altered mental status, left facial droop and left side weakness. Code stroke. Initial encounter.  EXAM: MRI HEAD WITHOUT CONTRAST  MRA HEAD WITHOUT CONTRAST  TECHNIQUE: Multiplanar, multiecho pulse sequences of the brain and surrounding structures were obtained without intravenous contrast. Angiographic images of the head were obtained using MRA technique without contrast.  COMPARISON:  Head CT without contrast 1200 hr today.  FINDINGS: MRI HEAD FINDINGS  Cortical and subcortical white matter restricted diffusion in the anterior right MCA territory including involvement of the  operculum and anterior 2/3 of the insula. Cortical involvement tracks cephalad toward the vertex, involving some of the pre motor area (series 3, image 33). There is early T2 and FLAIR hyperintensity in the insula. There are asymmetrically visible right MCA sylvian branches. See MRA findings below.  No contralateral left hemisphere or posterior fossa restricted diffusion. Major intracranial vascular flow voids are preserved.  No associated hemorrhage or mass effect. Partially empty sella. Pearline Cables and white matter signal elsewhere is within normal limits. No midline shift, mass effect, evidence of mass lesion, ventriculomegaly, extra-axial collection or acute intracranial hemorrhage. Cervicomedullary junction and pituitary are within normal limits. Negative visualized cervical spine.  Trace mastoid fluid. Mild mucosal thickening and/or mucous retention cysts in the maxillary and right sphenoid sinuses. Rightward gaze deviation. Negative scalp soft tissues. Visualized bone marrow signal is within normal limits.  MRA HEAD FINDINGS  Antegrade flow in the posterior circulation with codominant distal vertebral arteries. Patent PICA origins. Incidental fenestrated basilar artery. No basilar stenosis. AICA, SCA, and PCA origins are normal. Bilateral PCA  branches are within normal limits.  Antegrade flow in both ICA siphons. No siphon stenosis or irregularity. Ophthalmic artery origins are within normal limits. Posterior communicating arteries are diminutive or absent. Carotid termini are patent. MCA and ACA origins are normal. Diminutive or absent anterior communicating artery. Visualized bilateral ACA branches and visualized left MCA branches are within normal limits.  The right MCA M1 segment and bifurcation are patent. However, there is occlusion of the anterior right MCA M2 division just beyond its origin (series 603, image 4). The other right MCA branches appear within normal limits.  IMPRESSION: 1. 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 No hemorrhage or mass effect. 2. Otherwise negative intracranial MRI and MRA. Study discussed by telephone with Dr. Armida Sans on 12/21/2014 at 1939 hrs.   Electronically Signed   By: Genevie Ann M.D.   On: 12/21/2014 19:46   Mr Brain Wo Contrast  12/21/2014   CLINICAL DATA:  39 year old female with altered mental status, left facial droop and left side weakness. Code stroke. Initial encounter.  EXAM: MRI HEAD WITHOUT CONTRAST  MRA HEAD WITHOUT CONTRAST  TECHNIQUE: Multiplanar, multiecho pulse sequences of the brain and surrounding structures were obtained without intravenous contrast. Angiographic images of the head were obtained using MRA technique without contrast.  COMPARISON:  Head CT without contrast 1200 hr today.  FINDINGS: MRI HEAD FINDINGS  Cortical and subcortical white matter restricted diffusion in the anterior right MCA territory including involvement of the operculum and anterior 2/3 of the insula. Cortical involvement tracks cephalad toward the vertex, involving some of the pre motor area (series 3, image 33). There is early T2 and FLAIR hyperintensity in the insula. There are asymmetrically visible right MCA sylvian branches. See MRA findings below.  No contralateral left hemisphere or posterior fossa restricted diffusion. Major intracranial vascular flow voids are preserved.  No associated hemorrhage or mass effect. Partially empty sella. Pear[place] Cables and white matter signal elsewhere is within normal limits. No midline shift, mass effect, evidence of mass lesion, ventriculomegaly, extra-axial collection or acute intracranial hemorrhage. Cervicomedullary junction and pituitary are within normal limits. Negative visualized cervical spine.  Trace mastoid fluid. Mild mucosal thickening and/or mucous retention cysts in the maxillary and right sphenoid sinuses. Rightward gaze deviation. Negative scalp soft tissues. Visualized bone marrow signal is  within normal limits.  MRA HEAD FINDINGS  Antegrade flow in the posterior circulation with codominant distal vertebral arteries. Patent PICA origins. Incidental fenestrated basilar artery. No basilar stenosis. AICA, SCA, and PCA origins are normal. Bilateral PCA branches are within normal limits.  Antegrade flow in both ICA siphons. No siphon stenosis or irregularity. Ophthalmic artery origins are within normal limits. Posterior communicating arteries are diminutive or absent. Carotid termini are patent. MCA and ACA origins are normal. Diminutive or absent anterior communicating artery. Visualized bilateral ACA branches and visualized left MCA branches are within normal limits.  The right MCA M1 segment and bifurcation are patent. However, there is occlusion of the anterior right MCA M2 division just beyond its origin (series 603, image 4). The other right MCA branches appear within normal limits.  IMPRESSION: 1. 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 No hemorrhage or mass effect. 2. Otherwise negative intracranial MRI and MRA. Study discussed by telephone with Dr. Armida Sans on 12/21/2014 at 1939 hrs.   Electronically Signed   By: Genevie Ann M.D.   On: 12/21/2014 19:46   Medications: I have reviewed the  patient's current medications. Scheduled Meds: . aspirin  81 mg Oral Daily  . atorvastatin  20 mg Oral q1800  . calcium acetate  1,334 mg Oral TID WC  . [START ON 12/24/2014] cefUROXime (ZINACEF)  IV  1.5 g Intravenous To SS-Surg  . cyclobenzaprine  10 mg Oral QHS  . [START ON 01/03/2015] darbepoetin (ARANESP) injection - DIALYSIS  100 mcg Intravenous Q Thu-HD  . famotidine  20 mg Oral Daily  . folic acid  1 mg Oral Daily  . isosorbide mononitrate  30 mg Oral Daily  . metoprolol succinate  100 mg Oral Daily  . multivitamin  1 tablet Oral QHS  . mycophenolate  1,000 mg Oral BID  . nystatin   Topical TID  . predniSONE  20 mg Oral TID   Continuous  Infusions: . 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 Plan was left femoral graft placement for HD access, this may have to be delayed with her recent CVA. - Continue prednisone 20mg  PO TID - Continue Cellcept 1000mg  PO BID - Continue zofran 4mg  ODT q6hrs PRN - Continue PhosLo 1334mg  TIDWM - Follow daily renal function panel, CBC - Nephrology following pt, recs appreciated - F/U vascular surgery recommendations  Anemia: 2/2 to renal impairment + acute blood loss. AOCD baseline 10.9 on admission. Hgb 9.2 from 8.5 yesterday. Patient with oozing blood loss from LUE this morning. Possible hemoconcentration vs test variability, will follow. - Supplement folic acid - Continue transfuse PRN to Hgb > 7 - Follow qAM CBCs - Pt to supplement EPO per nephrology  HTN: Will be at 48 hrs from stroke later today, can resume aggressive BP control aims. Will start resuming medications one at a time and titrate. - Holding Amlodipine 10mg  PO for permissive HTN - Holding Hydralazine 50mg  PO TID for permissive HTN - Continue Imdur 30mg  PO and Continue Metoprolol succinate 100mg  PO daily as does have perinephric hematoma which may worsen with very high BP even though we are allowing some permissive HTN.  Thrombocytopenia and qualitative platelet dysfunction  plt 89 >> 73 today. Pt already  received 2x doses DDAVP early in admission. HIT and DIC panels were negative. Patient noted to have oozing blood and increased area of ecchymoses around LUE AVG surgery site - No heparin  Pain: Pain previously associated with renal biopsy sites. Now has some pain at surgical site, controlled - Percocet 5-325mg  q6hrs PRN - Flexeril 10mg  qHS PRN  Candidal rash: Resolved  SVT: s/p ablation 2014, has some recurrent palpitations in recent months, currently holter monitor on board. HLD: Atorvastatin 20mg  PO Hyperkalemia: Resolved  FEN: Renal diet, fluid restricted DVT ppx: SCDs FULL CODE  Dispo: Disposition deferred until improvement in clinical condition. Patient status will depend on vascular access and HD arrangement.  The patient does have a current PCP Lin Landsman, MD) and does not need an [facility] hospital follow-up appointment after discharge.  The patient does not have transportation limitations that hinder transportation  to clinic appointments.   LOS: 20 days   Collier Salina, MD 12/23/2014, 9:00 AM

## 2014-12-23 NOTE — Progress Notes (Signed)
Patient ID: [name], female   DOB: Dec 31, 1975, 39 y.o.   MRN: [id number]  [facility] Progress Note   Assessment/ Plan:   1. 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 Currently on TThS schedule, has outpatient HD spot. Plan was for her to get left femoral graft placement tomorrow, but with recent CVA this may be delayed. Will follow up vascular surgery's plan. Continue Cellcept to 1,000 mg BID and keep Prednisone 20 mg TID. Will switch to regular diet so she can get some protein. Will need to watch K.  2. 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 Currently on baby aspirin and atorvastatin. All neurological symptoms have resolved. Echo shows EF 65-70% and increased LV hypertrophy, trivial pericardial effusion. No thrombus.  Lipid panel with no abnormalities. HbA1c pending. Work up per primary team.  3. Hypertension: BP 202R-427C systolic. Allowing for permissive HTN due to stroke. Continue Toprol-XL 100 mg daily.  4. Anemia: Hgb stable at 9.2 this AM. S/p 1 unit 9/23. On Aranesp. No bleeding from catheter.  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 PTH level 71. Will need to have PTH monitoring, check in 3 months.  6. Thrombocytopenia: Platelets 89,000>73,000. Patient received 2 doses of DDAVP early in admission. HIT and DIC panels negative. Will avoid heparin products.  Subjective:   No acute events overnight. Patient states she is doing well, just frustrated with her hospital course. She is worried about losing her outpatient HD spot because she was told she needed to be at outpatient HD center on Tuesday and is still in hospital. I told her not to worry about this as Dr. Lorrene Reid is at her center and that she is in the hospital to get permanent access/work up  for her stroke which takes priority. She states primary team is contacting vascular surgery to determine if surgery still on for tomorrow.    Objective:   BP 167/91 mmHg  Pulse 89  Temp(Src) 98.7 F (37.1 C) (Oral)  Resp 20  Ht 5\' 3"  (1.6 m)  Wt 219 lb 5.7 oz (99.5 kg)  BMI 38.87 kg/m2  SpO2 100%  LMP 11/17/2014 (Approximate)  Intake/Output Summary (Last 24 hours) at 12/23/14 0837 Last data filed at 12/22/14 1811  Gross per 24 hour  Intake    120 ml  Output   2313 ml  Net  -2193 ml   Weight change:   Physical Exam: Gen: alert, resting in bed, pleasant, NAD  CVS: RRR, no m/g/r Resp: CTA bilaterally, breaths non-labored Abd: BS+, soft, obese, non-tender Ext: trace LE edema. Right femoral tunneled dialysis catheter without bleeding Neuro: alert and oriented x 4, CNs II-XII grossly intact  Imaging: Ct Head Wo Contrast  12/21/2014   CLINICAL DATA:  Code stroke, headache, eye pain, left-sided weakness  EXAM: CT HEAD WITHOUT CONTRAST  TECHNIQUE: Contiguous axial images were obtained from the base of the skull through the vertex without intravenous contrast.  COMPARISON:  None.  FINDINGS: No evidence of parenchymal hemorrhage or extra-axial fluid collection. No mass lesion, mass effect, or midline shift.  No CT evidence of acute infarction.  Cerebral volume is within normal limits.  No ventriculomegaly.  Partial opacification of the right sphenoid sinus. Visualized paranasal sinuses are otherwise essentially clear. The mastoid air cells are unopacified.  No evidence of calvarial fracture.  IMPRESSION: Normal head CT.  These results were called by telephone at the time of interpretation on 12/21/2014 at 12:04 pm to Dr. Alexis Goodell, who verbally acknowledged these results.   Electronically Signed   By: Julian Hy M.D.   On: 12/21/2014 12:06   Mr Jodene Nam Head Wo Contrast  12/21/2014   CLINICAL DATA:  39 year old female with altered mental status, left facial droop and left side  weakness. Code stroke. Initial encounter.  EXAM: MRI HEAD WITHOUT CONTRAST  MRA HEAD WITHOUT CONTRAST  TECHNIQUE: Multiplanar, multiecho pulse sequences of the brain and surrounding structures were obtained without intravenous contrast. Angiographic images of the head were obtained using MRA technique without contrast.  COMPARISON:  Head CT without contrast 1200 hr today.  FINDINGS: MRI HEAD FINDINGS  Cortical and subcortical white matter restricted diffusion in the anterior right MCA territory including involvement of the operculum and anterior 2/3 of the insula. Cortical involvement tracks cephalad toward the vertex, involving some of the pre motor area (series 3, image 33). There is early T2 and FLAIR hyperintensity in the insula. There are asymmetrically visible right MCA sylvian branches. See MRA findings below.  No contralateral left hemisphere or posterior fossa restricted diffusion. Major intracranial vascular flow voids are preserved.  No associated hemorrhage or mass effect. Partially empty sella. Pear[place] Cables and white matter signal elsewhere is within normal limits. No midline shift, mass effect, evidence of mass lesion, ventriculomegaly, extra-axial collection or acute intracranial hemorrhage. Cervicomedullary junction and pituitary are within normal limits. Negative visualized cervical spine.  Trace mastoid fluid. Mild mucosal thickening and/or mucous retention cysts in the maxillary and right sphenoid sinuses. Rightward gaze deviation. Negative scalp soft tissues. Visualized bone marrow signal is within normal limits.  MRA HEAD FINDINGS  Antegrade flow in the posterior circulation with codominant distal vertebral arteries. Patent PICA origins. Incidental fenestrated basilar artery. No basilar stenosis. AICA, SCA, and PCA origins are normal. Bilateral PCA branches are within normal limits.  Antegrade flow in both ICA siphons. No siphon stenosis or irregularity. Ophthalmic artery origins are within normal  limits. Posterior communicating arteries are diminutive or absent. Carotid termini are patent. MCA and ACA origins are normal. Diminutive or absent anterior communicating artery. Visualized bilateral ACA branches and visualized left MCA branches are within normal limits.  The right MCA M1 segment and bifurcation are patent. However, there is occlusion of the anterior right MCA M2 division just beyond its origin (series 603, image 4). The other right MCA branches appear within normal limits.  IMPRESSION: 1. 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 No hemorrhage or mass effect. 2. Otherwise negative intracranial MRI and MRA. Study discussed by telephone with Dr. Armida Sans on 12/21/2014 at 1939 hrs.   Electronically Signed   By: Genevie Ann M.D.   On: 12/21/2014 19:46   Mr Brain Wo Contrast  12/21/2014   CLINICAL DATA:  39 year old female with altered mental status, left facial droop and left side weakness. Code stroke. Initial encounter.  EXAM: MRI HEAD WITHOUT CONTRAST  MRA HEAD WITHOUT CONTRAST  TECHNIQUE: Multiplanar, multiecho pulse sequences of the brain and surrounding structures were obtained without intravenous contrast. Angiographic images of the head were obtained using MRA technique without contrast.  COMPARISON:  Head CT without contrast 1200 hr today.  FINDINGS: MRI HEAD FINDINGS  Cortical and subcortical white matter restricted diffusion in the anterior right MCA territory including involvement of the operculum and anterior 2/3 of the insula. Cortical involvement tracks cephalad  toward the vertex, involving some of the pre motor area (series 3, image 33). There is early T2 and FLAIR hyperintensity in the insula. There are asymmetrically visible right MCA sylvian branches. See MRA findings below.  No contralateral left hemisphere or posterior fossa restricted diffusion. Major intracranial vascular flow voids are preserved.  No associated hemorrhage or mass  effect. Partially empty sella. [place] Cables and white matter signal elsewhere is within normal limits. No midline shift, mass effect, evidence of mass lesion, ventriculomegaly, extra-axial collection or acute intracranial hemorrhage. Cervicomedullary junction and pituitary are within normal limits. Negative visualized cervical spine.  Trace mastoid fluid. Mild mucosal thickening and/or mucous retention cysts in the maxillary and right sphenoid sinuses. Rightward gaze deviation. Negative scalp soft tissues. Visualized bone marrow signal is within normal limits.  MRA HEAD FINDINGS  Antegrade flow in the posterior circulation with codominant distal vertebral arteries. Patent PICA origins. Incidental fenestrated basilar artery. No basilar stenosis. AICA, SCA, and PCA origins are normal. Bilateral PCA branches are within normal limits.  Antegrade flow in both ICA siphons. No siphon stenosis or irregularity. Ophthalmic artery origins are within normal limits. Posterior communicating arteries are diminutive or absent. Carotid termini are patent. MCA and ACA origins are normal. Diminutive or absent anterior communicating artery. Visualized bilateral ACA branches and visualized left MCA branches are within normal limits.  The right MCA M1 segment and bifurcation are patent. However, there is occlusion of the anterior right MCA M2 division just beyond its origin (series 603, image 4). The other right MCA branches appear within normal limits.  IMPRESSION: 1. 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 No hemorrhage or mass effect. 2. Otherwise negative intracranial MRI and MRA. Study discussed by telephone with Dr. Armida Sans on 12/21/2014 at 1939 hrs.   Electronically Signed   By: Genevie Ann M.D.   On: 12/21/2014 19:46    Labs: BMET  Recent Labs Lab 12/17/14 0902 12/18/14 0455 12/19/14 0541 12/20/14 0410 12/21/14 0500 12/22/14 0441 12/23/14 0636  NA 137 135 134* 133* 133* 137 135   K 4.8 4.3 4.3 4.3 4.0 4.3 4.0  CL 101 98* 98* 98* 98* 100* 97*  CO2 22 27 24 23 27 26 27   GLUCOSE 133* 136* 145* 165* 133* 125* 120*  BUN 98* 56* 76* 85* 44* 65* 40*  CREATININE 6.75* 4.65* 5.70* 5.99* 4.16* 5.30* 3.75*  CALCIUM 8.5* 8.2* 8.0* 8.0* 7.7* 8.2* 8.4*  PHOS 6.7* 5.6* 6.6* 6.7* 4.8* 6.2* 4.5   CBC  Recent Labs Lab 12/20/14 0810 12/21/14 0500 12/21/14 1950 12/22/14 0725 12/23/14 0636  WBC 3.4* 2.9*  --  3.5* 4.5  HGB 7.4* 6.7* 8.5* 8.3* 9.2*  HCT 21.6* 20.3* 25.5* 25.1* 27.4*  MCV 89.3 90.6  --  88.7 89.8  PLT 115* 72*  --  89* 73*    Medications:    . aspirin  81 mg Oral Daily  . atorvastatin  20 mg Oral q1800  . calcium acetate  1,334 mg Oral TID WC  . [START ON 12/24/2014] cefUROXime (ZINACEF)  IV  1.5 g Intravenous To SS-Surg  . cyclobenzaprine  10 mg Oral QHS  . [START ON 01/03/2015] darbepoetin (ARANESP) injection - DIALYSIS  100 mcg Intravenous Q Thu-HD  . famotidine  20 mg Oral Daily  . folic acid  1 mg Oral Daily  . isosorbide mononitrate  30 mg Oral Daily  . metoprolol succinate  100 mg Oral Daily  . multivitamin  1 tablet Oral  QHS  . mycophenolate  1,000 mg Oral BID  . nystatin   Topical TID  . predniSONE  20 mg Oral TID   [name], MD, MPH Internal Medicine PGY-2  12/23/2014, 8:37 AM

## 2014-12-23 NOTE — Progress Notes (Signed)
STROKE TEAM PROGRESS NOTE   SUBJECTIVE (INTERVAL HISTORY) No family is at the bedside.  Overall she feels her condition is stable. She shared some of her history with me. She also worries about her procedure of AVG and the timing of it. I told her that we need to work on the stroke work up to find out what causes her stroke.    OBJECTIVE Temp:  [97.6 F (36.4 C)-98.7 F (37.1 C)] 97.6 F (36.4 C) (09/25 1325) Pulse Rate:  [89-97] 91 (09/25 1325) Cardiac Rhythm:  [-] Normal sinus rhythm (09/25 0703) Resp:  [19-20] 19 (09/25 1325) BP: (155-184)/(87-91) 184/91 mmHg (09/25 1325) SpO2:  [97 %-100 %] 100 % (09/25 1325)   Recent Labs Lab 12/21/14 1138  GLUCAP 117*    Recent Labs Lab 12/19/14 0541 12/20/14 0410 12/21/14 0500 12/22/14 0441 12/23/14 0636  NA 134* 133* 133* 137 135  K 4.3 4.3 4.0 4.3 4.0  CL 98* 98* 98* 100* 97*  CO2 24 23 27 26 27   GLUCOSE 145* 165* 133* 125* 120*  BUN 76* 85* 44* 65* 40*  CREATININE 5.70* 5.99* 4.16* 5.30* 3.75*  CALCIUM 8.0* 8.0* 7.7* 8.2* 8.4*  PHOS 6.6* 6.7* 4.8* 6.2* 4.5    Recent Labs Lab 12/19/14 0541 12/20/14 0410 12/21/14 0500 12/22/14 0441 12/23/14 0636  ALBUMIN 2.8* 2.6* 2.4* 2.6* 3.0*    Recent Labs Lab 12/18/14 0455 12/20/14 0810 12/21/14 0500 12/21/14 1950 12/22/14 0725 12/23/14 0636  WBC 5.8 3.4* 2.9*  --  3.5* 4.5  HGB 8.8* 7.4* 6.7* 8.5* 8.3* 9.2*  HCT 25.6* 21.6* 20.3* 25.5* 25.1* 27.4*  MCV 88.0 89.3 90.6  --  88.7 89.8  PLT 110* 115* 72*  --  89* 73*   No results for input(s): CKTOTAL, CKMB, CKMBINDEX, TROPONINI in the last 168 hours. No results for input(s): LABPROT, INR in the last 72 hours. No results for input(s): COLORURINE, LABSPEC, [place], GLUCOSEU, HGBUR, BILIRUBINUR, KETONESUR, PROTEINUR, UROBILINOGEN, NITRITE, LEUKOCYTESUR in the last 72 hours.  Invalid input(s): APPERANCEUR     Component Value Date/Time   CHOL 113 12/21/2014 1638   TRIG 107 12/21/2014 1638   HDL 53 12/21/2014 1638    CHOLHDL 2.1 12/21/2014 1638   VLDL 21 12/21/2014 1638   LDLCALC 39 12/21/2014 1638   No results found for: HGBA1C    Component Value Date/Time   LABOPIA NONE DETECTED 07/21/2012 0215   COCAINSCRNUR NONE DETECTED 07/21/2012 0215   LABBENZ NONE DETECTED 07/21/2012 0215   AMPHETMU NONE DETECTED 07/21/2012 0215   THCU NONE DETECTED 07/21/2012 0215   LABBARB NONE DETECTED 07/21/2012 0215    No results for input(s): ETH in the last 168 hours.  I have personally reviewed the radiological images below and agree with the radiology interpretations.  Ct Head Wo Contrast  12/21/2014    IMPRESSION: Normal head CT.    Mri and Mra Head Wo Contrast  12/21/2014    IMPRESSION: 1. 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 No hemorrhage or mass effect. 2. Otherwise negative intracranial MRI and MRA.   2D echo - - Compared to the prior echo in 2014, there is now severe LV wall thickening. The EF is higher at 65-70%. A trivial pericardial effusion persists.  CUS - Study was technically limited due to patient anatomy, depth of vessels, and patient body habitus. Findings suggest 1-39% left internal carotid artery stenosis. Unable to adequately evaluate the right internal carotid artery, therefore cannot exclude stenosis. Vertebral arteries  are patent with antegrade flow.   PHYSICAL EXAM  Temp:  [97.6 F (36.4 C)-98.7 F (37.1 C)] 97.6 F (36.4 C) (09/25 1325) Pulse Rate:  [89-97] 91 (09/25 1325) Resp:  [19-20] 19 (09/25 1325) BP: (155-184)/(87-91) 184/91 mmHg (09/25 1325) SpO2:  [97 %-100 %] 100 % (09/25 1325)  General - morbid obesity, full moon face, well developed, in no apparent distress.  Ophthalmologic - fundi not visualized due to eye movement.  Cardiovascular - Regular rate and rhythm.  Mental Status -  Level of arousal and orientation to time, place, and person were intact. Language including expression, naming, repetition,  comprehension was assessed and found intact. Fund of Knowledge was assessed and was impaired.  Cranial Nerves II - XII - II - Visual field intact OU. III, IV, VI - Extraocular movements intact. V - Facial sensation intact bilaterally. VII - left mild lower facial droop. VIII - Hearing & vestibular intact bilaterally. X - Palate elevates symmetrically. XI - Chin turning & shoulder shrug intact bilaterally. XII - Tongue protrusion intact.  Motor Strength - The patient's strength was 5/5 RUE, 3+/5 LUE proximally but 4+/5 distally, 4/5 BLE.  Bulk was normal and fasciculations were absent.   Motor Tone - Muscle tone was assessed at the neck and appendages and was normal.  Reflexes - The patient's reflexes were symmetrical in all extremities and she had no pathological reflexes.  Sensory - Light touch, temperature/pinprick were assessed and were symmetrical.    Coordination - The patient had normal movements in the hands with no ataxia or dysmetria.  Tremor was absent.  Gait and Station - not tested due to safety concerns.   ASSESSMENT/PLAN [name] is a 39 y.o. female with history of SVT s/p ablation, SLE nephritis, ESRD on HD admitted for worsening renal function. She had prolonged hospital course for AVF, AVG, HD as well as anemia, thrombocytopenia, bleeding from surgical sites, AVF steal and then ligation. She also suffer stroke on 12/21/14. MRI showed right MCA stroke with right M2 cut off. Symptoms much improved. Stroke work up underway.    Stroke: right MCA infarct with M2 occlusion embolic pattern, etiology unclear. DDx include  1. cardioembolic - needs to rule out afib - pt has SVT s/p ablation, with continued palpitation, recently on 30 day cardiac monitoring, pending reports from Dr. Einar Gip - recommend TEE to rule out cardiac thrombus or other valvular etiology. Depending on the findings, will consider loop recorder later. Understand pt currently not a good candidate for  anticoagulation. 2. Paradoxical emboli - pt has recent venous manipulation - if pt has PFO and DVT, then paradoxical emboli is likely. Will need TEE and LE and UE venous doppler. Understand that pt may not be a good candidate for anticoagulation. 3. Antiphospholipid syndrome - pt has SLE and SLE nephritis - needs to rule out APL - recommend hypercoagulable work up - understand that pt currently is not a good candidate for anticoagulation 4. 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 5. Right carotid disease or dissection - carotid doppler not able to see right ICA - pt is on HD therefore we recommend CTA neck - however, would like primary team and nephrology on the same page though.  MRI  Right MCA infarct  MRA  Right M2 cut off  Carotid Doppler  Left ICA 1-39%, right ICA not able to see well due to technical difficulties  2D Echo  - LVH EF 65-70%  LDL 39  HgbA1c pending  SCDs for VTE prophylaxis  Diet NPO time specified Except for: Sips with Meds  Diet regular Room service appropriate?: Yes; Fluid consistency:: Thin   no antithrombotic prior to admission, now on aspirin 81 mg orally every day  Patient counseled to be compliant with her antithrombotic medications  Ongoing aggressive stroke risk factor management  Therapy recommendations:  pending  Disposition:  Pending  ESRD on HD  Nephrology on board  VVS working on the access  In terms of procedure timing - if pt needs to have general anesthesia, we recommend 2-3 weeks post stroke for urgent procedure and 3-6 months for elective procedure. Please avoid hypotension perioperatively due to right M2 occlusion. Pt should be informed about increased risk of recurrent stroke and risk/benefit discussion.    Hypertension  Home meds:   Norvasc, metoprolol Permissive hypertension (OK if <220/120) for 24-48 hours post stroke and then gradually normalized within 5-7 days.  Currently on metoprolol  On the high side  Patient counseled to be compliant with her blood pressure medications  Hyperlipidemia  Home meds:  lipitor 20   Currently on lipitor 20  LDL 36, goal < 70  Continue statin at discharge  Other Stroke Risk Factors  Obesity, Body mass index is 38.87 kg/(m^2).   ? OSA  Other Active Problems  Anemia  thrombocytopenia  Other Pertinent History  Pending AVG  Hospital day # 20   I have personally obtained the history, examined the patient, evaluated laboratory data, individually viewed imaging studies and agree with radiology interpretations. I also discussed with Dr. Ignacia Marvel regarding her care plan over the phone. The medical decision making is of high complexity.   Rosalin Hawking, MD PhD Stroke Neurology 12/23/2014 3:58 PM    To contact Stroke Continuity provider, please refer to [email or web address] After hours, contact General Neurology

## 2014-12-24 ENCOUNTER — Encounter (HOSPITAL_COMMUNITY): Payer: Self-pay | Admitting: Radiology

## 2014-12-24 ENCOUNTER — Encounter (HOSPITAL_COMMUNITY)
Admission: EM | Disposition: A | Discharge status: Rehabilitation Facility | Payer: Self-pay | Source: Home / Self Care | Attending: Internal Medicine

## 2014-12-24 ENCOUNTER — Inpatient Hospital Stay (HOSPITAL_COMMUNITY): Payer: Medicaid Other

## 2014-12-24 DIAGNOSIS — N185 Chronic kidney disease, stage 5: Secondary | ICD-10-CM

## 2014-12-24 DIAGNOSIS — D61818 Other pancytopenia: Secondary | ICD-10-CM | POA: Diagnosis present

## 2014-12-24 DIAGNOSIS — M25561 Pain in right knee: Secondary | ICD-10-CM

## 2014-12-24 DIAGNOSIS — I120 Hypertensive chronic kidney disease with stage 5 chronic kidney disease or end stage renal disease: Secondary | ICD-10-CM

## 2014-12-24 DIAGNOSIS — I69354 Hemiplegia and hemiparesis following cerebral infarction affecting left non-dominant side: Secondary | ICD-10-CM

## 2014-12-24 DIAGNOSIS — I63011 Cerebral infarction due to thrombosis of right vertebral artery: Secondary | ICD-10-CM

## 2014-12-24 DIAGNOSIS — I69392 Facial weakness following cerebral infarction: Secondary | ICD-10-CM

## 2014-12-24 DIAGNOSIS — D72819 Decreased white blood cell count, unspecified: Secondary | ICD-10-CM

## 2014-12-24 LAB — CBC
HCT: 25.1 % — ABNORMAL LOW (ref 36.0–46.0)
HEMOGLOBIN: 8.2 g/dL — AB (ref 12.0–15.0)
MCH: 29.5 pg (ref 26.0–34.0)
MCHC: 32.7 g/dL (ref 30.0–36.0)
MCV: 90.3 fL (ref 78.0–100.0)
PLATELETS: 59 10*3/uL — AB (ref 150–400)
RBC: 2.78 MIL/uL — AB (ref 3.87–5.11)
RDW: 14.4 % (ref 11.5–15.5)
WBC: 1.2 10*3/uL — AB (ref 4.0–10.5)

## 2014-12-24 LAB — HEMOGLOBIN A1C
Hgb A1c MFr Bld: 6.1 % — ABNORMAL HIGH (ref 4.8–5.6)
Mean Plasma Glucose: 128 mg/dL

## 2014-12-24 LAB — CBC WITH DIFFERENTIAL/PLATELET
BASOS PCT: 0 %
Basophils Absolute: 0 10*3/uL (ref 0.0–0.1)
Eosinophils Absolute: 0 10*3/uL (ref 0.0–0.7)
Eosinophils Relative: 1 %
HEMATOCRIT: 22.6 % — AB (ref 36.0–46.0)
HEMOGLOBIN: 7.5 g/dL — AB (ref 12.0–15.0)
LYMPHS PCT: 15 %
Lymphs Abs: 0.3 10*3/uL — ABNORMAL LOW (ref 0.7–4.0)
MCH: 29.9 pg (ref 26.0–34.0)
MCHC: 33.2 g/dL (ref 30.0–36.0)
MCV: 90 fL (ref 78.0–100.0)
MONOS PCT: 9 %
Monocytes Absolute: 0.2 10*3/uL (ref 0.1–1.0)
Neutro Abs: 1.2 10*3/uL — ABNORMAL LOW (ref 1.7–7.7)
Neutrophils Relative %: 75 %
Platelets: 56 10*3/uL — ABNORMAL LOW (ref 150–400)
RBC: 2.51 MIL/uL — ABNORMAL LOW (ref 3.87–5.11)
RDW: 14.5 % (ref 11.5–15.5)
WBC: 1.7 10*3/uL — ABNORMAL LOW (ref 4.0–10.5)

## 2014-12-24 LAB — HEPATIC FUNCTION PANEL
ALT: 9 U/L — AB (ref 14–54)
AST: 24 U/L (ref 15–41)
Albumin: 2.2 g/dL — ABNORMAL LOW (ref 3.5–5.0)
Alkaline Phosphatase: 53 U/L (ref 38–126)
BILIRUBIN DIRECT: 0.2 mg/dL (ref 0.1–0.5)
BILIRUBIN INDIRECT: 0.4 mg/dL (ref 0.3–0.9)
Total Bilirubin: 0.6 mg/dL (ref 0.3–1.2)
Total Protein: 4.6 g/dL — ABNORMAL LOW (ref 6.5–8.1)

## 2014-12-24 LAB — RENAL FUNCTION PANEL
ANION GAP: 9 (ref 5–15)
Albumin: 2.5 g/dL — ABNORMAL LOW (ref 3.5–5.0)
BUN: 52 mg/dL — ABNORMAL HIGH (ref 6–20)
CALCIUM: 8.3 mg/dL — AB (ref 8.9–10.3)
CHLORIDE: 100 mmol/L — AB (ref 101–111)
CO2: 26 mmol/L (ref 22–32)
Creatinine, Ser: 4.47 mg/dL — ABNORMAL HIGH (ref 0.44–1.00)
GFR, EST AFRICAN AMERICAN: 13 mL/min — AB (ref >60)
GFR, EST NON AFRICAN AMERICAN: 12 mL/min — AB (ref >60)
Glucose, Bld: 91 mg/dL (ref 65–99)
Phosphorus: 4.5 mg/dL (ref 2.5–4.6)
Potassium: 3.9 mmol/L (ref 3.5–5.1)
Sodium: 135 mmol/L (ref 135–145)

## 2014-12-24 LAB — SAVE SMEAR

## 2014-12-24 LAB — LACTATE DEHYDROGENASE: LDH: 309 U/L — ABNORMAL HIGH (ref 98–192)

## 2014-12-24 LAB — C-REACTIVE PROTEIN: CRP: 3.7 mg/dL — ABNORMAL HIGH (ref <1.0)

## 2014-12-24 LAB — ANTITHROMBIN III: ANTITHROMB III FUNC: 133 % — AB (ref 75–120)

## 2014-12-24 LAB — DIRECT ANTIGLOBULIN TEST (NOT AT ARMC)
DAT, IGG: NEGATIVE
DAT, complement: NEGATIVE

## 2014-12-24 LAB — SEDIMENTATION RATE: SED RATE: 44 mm/h — AB (ref 0–22)

## 2014-12-24 SURGERY — INSERTION OF ARTERIOVENOUS (AV) GORE-TEX GRAFT THIGH
Anesthesia: Choice | Laterality: Left

## 2014-12-24 MED ORDER — IOHEXOL 350 MG/ML SOLN
50.0000 mL | Freq: Once | INTRAVENOUS | Status: AC | PRN
  Administered 2014-12-24: 50 mL via INTRAVENOUS

## 2014-12-24 MED ORDER — PRO-STAT SUGAR FREE PO LIQD
30.0000 mL | Freq: Three times a day (TID) | ORAL | Status: DC
  Administered 2014-12-25 – 2014-12-31 (×9): 30 mL via ORAL
  Filled 2014-12-24 (×14): qty 30

## 2014-12-24 NOTE — Progress Notes (Signed)
Spoke with patient. Patient has had prolonged hospital course and difficulty establishing HD access. Plan to attempt acces from Left leg next. Patient has been clipped. Has had several transfusions due to low hgb, receives Epigen during dialysis weekly. Will discharge to home when hemodynamically stable.

## 2014-12-24 NOTE — Consult Note (Signed)
Referring MD:   PCP:  Kristine Garbe, MD   Reason for Referral: Rule out TTP   Chief Complaint  Patient presents with  . Hematuria    HPI:  Complicated 39 year old woman who developed a number of serious medical problems in 2014. 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 She was treated with steroids and CellCept and stabilized. In that same year she underwent a radiofrequency ablation procedure for SVT. Her mother and one of her sisters also diagnosed with SVT. She was also told she had fibromyalgia. Her kidney function deteriorated. She had another biopsy in June 2016 which showed only membranous changes so immunosuppressive drugs were weaned until there was an abrupt worsening of her renal function. 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 She was treated with high-dose parenteral steroids and put back on CellCept. She was kept on maintenance prednisone. That procedure was complicated by a retroperitoneal hematoma. She presented to the hospital at the time of the current admission 12/03/2014 with dysuria, subjective fever, nausea, and recurrent hematuria.. Urine grew greater than 100,000 colonies of Klebsiella and she was treated with ceftriaxone. By that time her renal function had deteriorated further with BUN 124 and creatinine 8.7 and dialysis was initiated. She was seen by vascular surgery and on September 20 a hybrid vascular and [place] graft was placed in the left forearm. 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 MR angiogram did not show small vessel disease. She was started on  aspirin and a statin drug. Echocardiogram on September 24 with ejection fraction 65-70 percent, left ventricular hypertrophy, but no cardiac source of emboli. Carotid ultrasound studies were technically limited. CT angiogram of the neck vessels today showed normal carotids and vertebral arteries with no aneurysms or stenosis. She is scheduled for a TEE tomorrow.  She had significant oozing of blood from her vascular graft site and required a few doses of DDAVP to control bleeding. On September 10 there was an initial fall in her platelet count from 413,000 on September 8 to 122,000 on September 10. Of note, at approximately the time steroids and CellCept were started on August 25 hemoglobin 7.6, white count 4900, platelet count 286,000. Initial rise in white count on the steroids to peak value of 17,900 by September 9. Platelets 373,000. Tests for heparin associated thrombocytopenia were negative. Platelet count fluctuated back into the normal range only to start falling again on September 20 to 110,000 with subsequent fall to today's value of 56,000. Over the same interval, there has been a drop in her white blood count from 7200 on September 19 to 1200 today with repeat value 1700 with 75% neutrophils, 15 lymphocytes, 9 monocytes, 1 eosinophil. She has been anemic all along and has required intermittent blood transfusions. Hemoglobin down to 6.7 on September 23. Posttransfusion peak 9.2 on September 25 with subsequent decline to today's value of 7.5 with MCV 90. Additional testing to assess for hemolysis done today: Direct Coombs test negative, total bilirubin 0.6, LDH 309, Haptoglobin pending. Coagulation studies were normal on June 6 with a pro time of 12.1 seconds, PTT 26 seconds. Afebrile June was normal on  September 15 at 256. Reticulocyte count low corrected for anemia at 1.7% on September 17.    Past Medical History  Diagnosis Date  . Hypertension   . Cardiac arrhythmia   . SVT  (supraventricular tachycardia)     "today, last week, 2 wk ago; maybe 1 month ago, etc; started w/in last 3-4 yrs"(07/20/2012)  . Fainting     "~ 1 month ago; probably related to SVT" (07/20/2012)  . Heart murmur     "small" (07/20/2012)  . Shortness of breath     "related to SVT episodes" (07/20/2012)  . Anemia     "today" (07/20/2012)  . Chest pain at rest     "related to SVT" (07/20/2012)  . Sleep apnea     "don't wear mask" (07/20/2012)  . Daily headache   . Anxiety   . Lupus (systemic lupus erythematosus)   . Renal disorder   . Chronic renal failure, stage 4 (severe)   . GERD (gastroesophageal reflux disease)   . Fibromyalgia   . Irritable bowel syndrome (IBS)   . Renal insufficiency   : Recent significant menorrhagia due to uterine fibroids. She had a appointment with OB/GYN which she missed when she was hospitalized on September 5.   Past Surgical History  Procedure Laterality Date  . Appendectomy  2011  . Tubal ligation  2010  . Cesarean section  2001; 2010  . Cervical biopsy  2013  . Ablation      07/21/12 Dr. Cristopher Peru   . Peripheral vascular catheterization N/A 12/14/2014    Procedure: Upper Extremity Venography;  Surgeon: Elam Dutch, MD;  Location: [place] [facility];  Service: Cardiovascular;  Laterality: N/A;  . Insertion hybrid anteriovenous gortex graft Left 12/18/2014    Procedure: INSERTION GORE HYBRID ARTERIOVENOUS GRAFT LEFT AXILLO-BRACHIAL.;  Surgeon: Serafina Mitchell, MD;  Location: [facility];  Service: Vascular;  Laterality: Left;  . Ligation of arteriovenous  fistula Left 12/19/2014    Procedure: LIGATION OF FISTULA;  Surgeon: Elam Dutch, MD;  Location: [place];  Service: Vascular;  Laterality: Left;  :  . amLODipine  10 mg Oral Daily  . aspirin  81 mg Oral Daily  . atorvastatin  20 mg Oral q1800  . calcium acetate  1,334 mg Oral TID WC  . cefUROXime (ZINACEF)  IV  1.5 g Intravenous To SS-Surg  . cyclobenzaprine  10 mg Oral QHS  . [START ON  01/03/2015] darbepoetin (ARANESP) injection - DIALYSIS  100 mcg Intravenous Q Thu-HD  . famotidine  20 mg Oral Daily  . feeding supplement (PRO-STAT SUGAR FREE 64)  30 mL Oral TID BM  . folic acid  1 mg Oral Daily  . hydrALAZINE  50 mg Oral 3 times per day  . isosorbide mononitrate  30 mg Oral Daily  . metoprolol succinate  100 mg Oral Daily  . multivitamin  1 tablet Oral QHS  . predniSONE  20 mg Oral TID  :  Allergies  Allergen Reactions  . Food Swelling    Red peppers  :  Family History  Problem Relation Age of Onset  . Cancer Mother     breast and ovarian  . BRCA 1/2 Sister   : A sister age 50 who is healthy. A sister age 85 who had leukemia as a child. Treated for SVT as an adult.   Social History   Social History  . Marital Status: Single    Spouse Name: N/A  . Number of Children:  a son 80 and a daughter 62   . Years of Education: N/A   Occupational History  .  she never worked outside the home    Social History Main Topics  . Smoking status: Never Smoker   . Smokeless tobacco: Never Used  . Alcohol Use: No     Comment: 07/20/2012 "might have a beer once/yr"  . Drug Use: No  . Sexual Activity: Not Currently    Birth Control/ Protection: None   Other Topics Concern  . Not on file   Social History Narrative  : She did not work outside the home. Denies exposure to toxic chemicals or radiation.   ROS: See history of present illness. She has not had any severe headaches or change in vision. No unusual rashes or bruising. Recent significant menorrhagia. She was told this relates to fibroids. She has trouble swallowing her pills. She was evaluated for dyspnea back in August and a nuclear medicine lung scan was negative for pulmonary embolus. Breathing currently not labored. No change in bowel habit. She denies any hematochezia. Recent hematuria. None since hospitalized. Fibromyalgia causes both polyarthralgia and polymyalgia She's never had a seizure  Review  of systems otherwise unremarkable   Vitals: Filed Vitals:   12/24/14 1508  BP: 122/71  Pulse: 102  Temp: 98.6 F (37 C)  Resp: 16    PHYSICAL EXAM: General appearance: Pleasant, obese, African-American woman in no distress HEENT: Pharynx no erythema, exudate, mass, or ulcer. Lymph Nodes: No cervical, supraclavicular, or axillary adenopathy Resp: Clear to auscultation and resonant to percussion throughout Cardio: Regular rhythm, no murmur, no gallop, no rub Vascular: Carotid pulses 1+ symmetric no bruits. Radial pulses 2+ symmetric, ulnar pulses 1+ symmetric, dorsalis pedis pulses 2+ symmetric Breasts: GI: Abdomen soft, obese, nontender, no obvious mass or organomegaly GU: Extremities: Wound dressing over it ligated vascular graft left forearm currently dry. Neurologic: She is alert, awake, oriented to person and place. Speech is fluent. No facial asymmetry at this time. Full extraocular movements. PERRLA. Tongue midline. Palate elevates symmetrically. Motor strength is 5 over 5 upper and lower extremity on the right and lower extremity on the left. Decreased handgrip on the left. Reflexes 1+ symmetric at the knees. 1+ symmetric right biceps. Not tested on the left due to surgical dressing. Upper body coordination is normal. Skin: No rash or ecchymoses  Labs:   Recent Labs  12/24/14 0653 12/24/14 1110  WBC 1.2* 1.7*  HGB 8.2* 7.5*  HCT 25.1* 22.6*  PLT 59* 56*    Recent Labs  12/23/14 0636 12/24/14 0500  NA 135 135  K 4.0 3.9  CL 97* 100*  CO2 27 26  GLUCOSE 120* 91  BUN 40* 52*  CREATININE 3.75* 4.47*  CALCIUM 8.4* 8.3*    Blood smear review: Normochromic normocytic red cells. 1+ polychromasia, no schistocytes, white cells decreased, neutrophils with toxic granulations and Dohle bodies, platelets mildly decreased 4-7 per high-powered field. Many of the platelets are large and will spuriously lower the machine count. Visual estimate 75-90,000.  Images  Studies/Results:  Ct Angio Neck W/cm &/or Wo/cm  12/24/2014   CLINICAL DATA:  Recent RIGHT brain infarct. Unable to adequately visualize RICA/Right carotid bifurcation on ultrasound.  EXAM: CT ANGIOGRAPHY NECK  TECHNIQUE: Multidetector CT imaging of the neck was performed using the standard protocol during bolus administration of intravenous contrast. Multiplanar CT image reconstructions and MIPs were obtained to evaluate the vascular anatomy. Carotid stenosis measurements (when applicable) are obtained utilizing NASCET criteria, using  the distal internal carotid diameter as the denominator.  CONTRAST:  67m OMNIPAQUE IOHEXOL 350 MG/ML SOLN  COMPARISON:  MR brain 12/21/2014.  FINDINGS: Aortic arch: Standard branching. Imaged portion shows no evidence of aneurysm or dissection. No significant stenosis of the major arch vessel origins.  Right carotid system: Extreme dolichoectasia. Retropharyngeal RIGHT ICA location explains difficulty of sonographic visualization. No evidence of dissection, stenosis (50% or greater) or occlusion.  Left carotid system: Extreme dolichoectasia. No evidence of dissection, stenosis (50% or greater) or occlusion.  Vertebral arteries: Codominant. No evidence of dissection, stenosis (50% or greater) or occlusion.  Skeleton: Mild spondylosis.  No osseous lesion.  Other neck: Vascular catheter RIGHT IJ approach with tips in the SVC, appears uncomplicated. Moderate soft tissue stranding over the anterior chest with a prominent pectoral lymph node. Unremarkable thyroid. Negative intracranial compartment. Dysconjugate gaze. No sinus or mastoid fluid. BILATERAL IJ patency.  IMPRESSION: No extracranial cerebral vascular disease is evident.  Extreme dolichoectasia of the carotid system, with a retropharyngeal RIGHT ICA, explains difficulty of sonographic visualization.   Electronically Signed   By: JStaci RighterM.D.   On: 12/24/2014 15:13     Pathology: See history of present illness     Assessment: Principal Problem:   ESRD (end stage renal disease) on dialysis Active Problems:   Anemia   Hypertension   Lupus nephritis   AKI (acute kidney injury)   UTI (lower urinary tract infection)   Hematuria   Retroperitoneal hematoma   Hyperkalemia   Bleeding   Steal syndrome as complication of dialysis access   CVA (cerebral vascular accident)   Left-sided weakness   Thrombocytopenia   Pressure ulcer   CVA (cerebral infarction)   Stroke  Impression: #1. Fluctuating pancytopenia starting on September 10, now with progressive leukopenia and thrombocytopenia. At approximately the time steroids and CellCept were resumed on August 25,  hemoglobin 7.6, white count 4900, platelet count 286,000. Initial rise in white count on the steroids to peak value of 17,900 by September 9. Platelets 373,000. First decline in platelet count on September 10 down to 128,000. Progressive leukopenia and thrombocytopenia since September 22. There are no schistocytes on review of the peripheral blood film. There are toxic changes in the neutrophils suggesting possibility of infection although she has no localizing signs and has been afebrile. LDH mildly elevated at 309 but this is nonspecific in the face of a recent stroke with ischemic tissue damage. Direct Coombs test is negative and bilirubin is normal. Reticulocyte count is not elevated. Assay for heparin induced thrombocytopenia is negative. Haptoglobin pending.  There is no evidence that we are dealing with a microangiopathic process such as TTP at this time. The acuity of the fall and her counts and the presence of leukopenia as well as thrombocytopenia also argues against TTP. Differential for her pancytopenia at this time includes bone marrow suppression from CellCept versus occult infection versus immune mediated secondary to her lupus.   CellCept has been stopped. Survey blood cultures have been obtained. If counts continue to  deteriorate, I would consider a trial of IVIG coordinated with dialysis in view of the high protein/fluid load. This is potentially useful both for immune mediated cytopenias and vasculitis.  #2. Large vessel stroke in a 39year old woman with active and progressive lupus nephritis. No evidence for small vessel involvement on MR angiogram and no clinical encephalopathy to suggest lupus cerebritis/vasculitis although active lupus could certainly predispose towards stroke.  No evidence of for for cardiac embolic etiology so  far. TEE planned for 9/27.. Stroke related to anti-phospholipid antibody syndrome certainly a possibility. Some people have added Plaquenil to aspirin in this situation. The majority of the other inherited coagulopathies cause almost exclusively venous thrombotic events and not arterial events although I have seen lupus patients develop acquired protein S deficiency from antibodies against this protein. This subset of patients could be treated with Coumadin. Blood for these studies has been drawn and results are outstanding. If her platelet count continues to fall, we may have to consider changing her to Coumadin. There is no evidenced based role for the new oral anticoagulants in patients with end-stage renal disease despite what the package inserts may say to the contrary.   Murriel Hopper, MD, FACP  Hematology-Oncology/Internal [place] 12/24/2014, 9:17 PM

## 2014-12-24 NOTE — Progress Notes (Signed)
Subjective: Patient states she is feeling tired, but has a new complaint of right knee pain. Notably flat affect compared to her demeanor on presenting. Neurological deficits unimproved from yesterday. Originally scheduled AVG surgery for permanent HD access cancelled today in the setting of recent stroke, thrombocytopenia, leukopenia,   Objective: Vital signs in last 24 hours: Filed Vitals:   12/23/14 1325 12/23/14 2121 12/24/14 0416 12/24/14 1102  BP: 184/91 145/77 167/80 127/67  Pulse: 91 96 116 113  Temp: 97.6 F (36.4 C) 97.3 F (36.3 C) 98.4 F (36.9 C)   TempSrc: Oral Oral Oral   Resp: 19  18 18   Height:      Weight:      SpO2: 100% 100%     Weight change:   Intake/Output Summary (Last 24 hours) at 12/24/14 1213 Last data filed at 12/23/14 1816  Gross per 24 hour  Intake    120 ml  Output      0 ml  Net    120 ml   GENERAL- awake, appears tired.  HEENT- very mild left facial droop, EOMI, PERRL CARDIAC- RRR, no murmurs, rubs or gallops. RESP- CTAB, no wheezes or crackles. ABDOMEN- Nontender, not distended, bruising present laterally above left lower waistline EXTREMITIES- 2+ palpable distal pulses, 1+ pitting pedal edema, dry dressings intact around R fem catheter and LUE AVG site SKIN- Warm, dry, ecchymoses around LIJ incision site NEURO-Mild left facial droop, speech is clear, left shoulder shrug is 3/5 strength, LUE is 4/5 strength in proximal muscles but 2/5 in left hand today. RUE WNL. Her right knee pain is not worsened on palpation or passive ROM. PSYCH- very flat affect, appropriate speech  Lab Results: Basic Metabolic Panel:  Recent Labs Lab 12/23/14 0636 12/24/14 0500  NA 135 135  K 4.0 3.9  CL 97* 100*  CO2 27 26  GLUCOSE 120* 91  BUN 40* 52*  CREATININE 3.75* 4.47*  CALCIUM 8.4* 8.3*  PHOS 4.5 4.5   Liver Function Tests:  Recent Labs Lab 12/23/14 0636 12/24/14 0500  ALBUMIN 3.0* 2.5*   No results for input(s): LIPASE, AMYLASE in  the last 168 hours. No results for input(s): AMMONIA in the last 168 hours. CBC:  Recent Labs Lab 12/24/14 0653 12/24/14 1110  WBC 1.2* 1.7*  NEUTROABS  --  PENDING  HGB 8.2* 7.5*  HCT 25.1* 22.6*  MCV 90.3 90.0  PLT 59* 56*   Cardiac Enzymes: No results for input(s): CKTOTAL, CKMB, CKMBINDEX, TROPONINI in the last 168 hours. BNP: No results for input(s): PROBNP in the last 168 hours. D-Dimer: No results for input(s): DDIMER in the last 168 hours. CBG:  Recent Labs Lab 12/21/14 1138  GLUCAP 117*   Hemoglobin A1C: No results for input(s): HGBA1C in the last 168 hours. Fasting Lipid Panel:  Recent Labs Lab 12/21/14 1638  CHOL 113  HDL 53  LDLCALC 39  TRIG 107  CHOLHDL 2.1   Thyroid Function Tests: No results for input(s): TSH, T4TOTAL, FREET4, T3FREE, THYROIDAB in the last 168 hours. Coagulation: No results for input(s): LABPROT, INR in the last 168 hours. Anemia Panel: No results for input(s): VITAMINB12, FOLATE, FERRITIN, TIBC, IRON, RETICCTPCT in the last 168 hours. Urine Drug Screen: Drugs of Abuse     Component Value Date/Time   LABOPIA NONE DETECTED 07/21/2012 0215   COCAINSCRNUR NONE DETECTED 07/21/2012 0215   LABBENZ NONE DETECTED 07/21/2012 0215   AMPHETMU NONE DETECTED 07/21/2012 0215   THCU NONE DETECTED 07/21/2012 0215   LABBARB  NONE DETECTED 07/21/2012 0215    Alcohol Level: No results for input(s): ETH in the last 168 hours. Urinalysis: No results for input(s): COLORURINE, LABSPEC, PHURINE, GLUCOSEU, HGBUR, BILIRUBINUR, KETONESUR, PROTEINUR, UROBILINOGEN, NITRITE, LEUKOCYTESUR in the last 168 hours.  Invalid input(s): APPERANCEUR   Micro Results: Recent Results (from the past 240 hour(s))  Surgical pcr screen     Status: None   Collection Time: 12/18/14  7:56 AM  Result Value Ref Range Status   MRSA, PCR NEGATIVE NEGATIVE Final   Staphylococcus aureus NEGATIVE NEGATIVE Final    Comment:        The Xpert SA Assay (FDA approved for  NASAL specimens in patients over 61 years of age), is one component of a comprehensive surveillance program.  Test performance has been validated by [facility] for patients greater than or equal to 49 year old. It is not intended to diagnose infection nor to guide or monitor treatment.    Studies/Results: No results found. Medications: I have reviewed the patient's current medications. Scheduled Meds: . amLODipine  10 mg Oral Daily  . aspirin  81 mg Oral Daily  . atorvastatin  20 mg Oral q1800  . calcium acetate  1,334 mg Oral TID WC  . cefUROXime (ZINACEF)  IV  1.5 g Intravenous To SS-Surg  . cyclobenzaprine  10 mg Oral QHS  . [START ON 01/03/2015] darbepoetin (ARANESP) injection - DIALYSIS  100 mcg Intravenous Q Thu-HD  . famotidine  20 mg Oral Daily  . folic acid  1 mg Oral Daily  . hydrALAZINE  50 mg Oral 3 times per day  . isosorbide mononitrate  30 mg Oral Daily  . metoprolol succinate  100 mg Oral Daily  . multivitamin  1 tablet Oral QHS  . predniSONE  20 mg Oral TID   Continuous Infusions: . sodium chloride 20 mL/hr at 12/24/14 0756   PRN Meds:.acetaminophen, gelatin adsorbable, morphine injection, ondansetron, oxyCODONE-acetaminophen, sodium chloride Assessment/Plan: R MCA ant dvision M2 occlusion causing patchy Right MCA infarct Deficit today similar or slightly worse than yesterday 9/25, better than the acute event. Patient is not a candidate for anticoagulation due to recent vascular surgery. Pursuing a hypercoagulability workup as stroke was most likely embolic in pattern with all other cerebral vasculature looking good. So far ESR 44, CRP 3.7,  Anti-thrombin 133. - appreciate neurology recs - ASA 25m - Cotinue to F/U hyerpcoagulability - Consulted for TEE  - Upper and lower extremity doppler venous ultrasound  Acute on chronic renal insufficiency 2/2 lupus nephritis and UTI: Vascular access placement deferred from today in the setting of her recent stroke  and worsening blood counts. - Stopping Cellcept 10091mPO BID - Continue prednisone 2076mO TID - Continue zofran 4mg74mT q6hrs PRN - Continue PhosLo 1334mg20mWM - Follow daily renal function panel, CBC - Nephrology following pt, recs appreciated - F/U vascular surgery recommendations  Pancytopenia AOCD baseline 10.9 on admission. Hgb 7.5 from 8.2 yesterday. No visible bleeding today, slight progression of ecchymoses on LUE.  Initially she had a leukocytosis from 12.6 up to 17.9 2/2 UTI and steroids but now very decreased. WBCs 1.2-1.7 today, platelets at 56. Pt already received 2x doses DDAVP early in admission. HIT and DIC panels were negative. Possible that cellcept could be suppressing production. TTP also a consideration with recent stroke. - Supplement folic acid - Continue transfuse PRN to Hgb > 7 - Follow qAM CBCs - Pt to supplement EPO per nephrology - Hemolysis labs, haptoglobin, LDH, LFTs -  PArvovirus B19 antibody, IgM, IgG, PCR - Will obtain peripheral smear for microscopy  HTN: Will start resuming medications one at a time and titrate, several were held after stroke. - Amlodipine 43m PO  - Hydralazine 568mPO TID - Continue Imdur 3041m  Metoprolol succinate 100m18m   Pain: Currently only complaining of right knee pain. - Percocet 5-325mg84mrs PRN - Flexeril 10mg 8mPRN  Candidal rash: Resolved  SVT: s/p ablation 2014, has some recurrent palpitations in recent months HLD: Atorvastatin 20mg P25mperkalemia: Resolved  FEN: Renal diet, fluid restricted DVT ppx: SCDs FULL CODE  Dispo: Disposition deferred until improvement in clinical condition.  The patient does have a current PCP (Betti Lin Landsmannd [facility].  The patient does not have transportation limitations that hinder transportation to clinic appointments.   LOS: 21 days   ChristoCollier Salina26/2016, 12:13 PM

## 2014-12-24 NOTE — Progress Notes (Signed)
STROKE TEAM PROGRESS NOTE   SUBJECTIVE (INTERVAL HISTORY) No family is at the bedside.  Overall she feels her condition is stable. She seems in depressed mood and just cried before I went into her room. She denies any change of weakness at right arm or leg. She stated that she still has a little weakness in her left leg.    OBJECTIVE Temp:  [97.3 F (36.3 C)-98.4 F (36.9 C)] 98.4 F (36.9 C) (09/26 0416) Pulse Rate:  [91-116] 113 (09/26 1102) Cardiac Rhythm:  [-] Sinus tachycardia (09/26 0704) Resp:  [18-19] 18 (09/26 1102) BP: (127-184)/(67-91) 127/67 mmHg (09/26 1102) SpO2:  [100 %] 100 % (09/25 2121)   Recent Labs Lab 12/21/14 1138  GLUCAP 117*    Recent Labs Lab 12/20/14 0410 12/21/14 0500 12/22/14 0441 12/23/14 0636 12/24/14 0500  NA 133* 133* 137 135 135  K 4.3 4.0 4.3 4.0 3.9  CL 98* 98* 100* 97* 100*  CO2 23 27 26 27 26   GLUCOSE 165* 133* 125* 120* 91  BUN 85* 44* 65* 40* 52*  CREATININE 5.99* 4.16* 5.30* 3.75* 4.47*  CALCIUM 8.0* 7.7* 8.2* 8.4* 8.3*  PHOS 6.7* 4.8* 6.2* 4.5 4.5    Recent Labs Lab 12/20/14 0410 12/21/14 0500 12/22/14 0441 12/23/14 0636 12/24/14 0500  ALBUMIN 2.6* 2.4* 2.6* 3.0* 2.5*    Recent Labs Lab 12/21/14 0500 12/21/14 1950 12/22/14 0725 12/23/14 0636 12/24/14 0653 12/24/14 1110  WBC 2.9*  --  3.5* 4.5 1.2* 1.7*  NEUTROABS  --   --   --   --   --  PENDING  HGB 6.7* 8.5* 8.3* 9.2* 8.2* 7.5*  HCT 20.3* 25.5* 25.1* 27.4* 25.1* 22.6*  MCV 90.6  --  88.7 89.8 90.3 90.0  PLT 72*  --  89* 73* 59* 56*   No results for input(s): CKTOTAL, CKMB, CKMBINDEX, TROPONINI in the last 168 hours. No results for input(s): LABPROT, INR in the last 72 hours. No results for input(s): COLORURINE, LABSPEC, [place], GLUCOSEU, HGBUR, BILIRUBINUR, KETONESUR, PROTEINUR, UROBILINOGEN, NITRITE, LEUKOCYTESUR in the last 72 hours.  Invalid input(s): APPERANCEUR     Component Value Date/Time   CHOL 113 12/21/2014 1638   TRIG 107 12/21/2014 1638    HDL 53 12/21/2014 1638   CHOLHDL 2.1 12/21/2014 1638   VLDL 21 12/21/2014 1638   LDLCALC 39 12/21/2014 1638   No results found for: HGBA1C    Component Value Date/Time   LABOPIA NONE DETECTED 07/21/2012 0215   COCAINSCRNUR NONE DETECTED 07/21/2012 0215   LABBENZ NONE DETECTED 07/21/2012 0215   AMPHETMU NONE DETECTED 07/21/2012 0215   THCU NONE DETECTED 07/21/2012 0215   LABBARB NONE DETECTED 07/21/2012 0215    No results for input(s): ETH in the last 168 hours.  I have personally reviewed the radiological images below and agree with the radiology interpretations.  Ct Head Wo Contrast  12/21/2014    IMPRESSION: Normal head CT.    Mri and Mra Head Wo Contrast  12/21/2014    IMPRESSION: 1. 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 No hemorrhage or mass effect. 2. Otherwise negative intracranial MRI and MRA.   2D echo - - Compared to the prior echo in 2014, there is now severe LV wall thickening. The EF is higher at 65-70%. A trivial pericardial effusion persists.  CUS - Study was technically limited due to patient anatomy, depth of vessels, and patient body habitus. Findings suggest 1-39% left internal carotid artery stenosis. Unable to  adequately evaluate the right internal carotid artery, therefore cannot exclude stenosis. Vertebral arteries are patent with antegrade flow.  LE and UE venous doppler pending  CTA neck pending  TEE pending   PHYSICAL EXAM  Temp:  [97.3 F (36.3 C)-98.4 F (36.9 C)] 98.4 F (36.9 C) (09/26 0416) Pulse Rate:  [91-116] 113 (09/26 1102) Resp:  [18-19] 18 (09/26 1102) BP: (127-184)/(67-91) 127/67 mmHg (09/26 1102) SpO2:  [100 %] 100 % (09/25 2121)  General - morbid obesity, full moon face, well developed, in no apparent distress, but depressed mood.  Ophthalmologic - fundi not visualized due to eye movement.  Cardiovascular - Regular rate and rhythm.  Mental Status -  Level of  arousal and orientation to time, place, and person were intact. Language including expression, naming, repetition, comprehension was assessed and found intact. Fund of Knowledge was assessed and was impaired.  Cranial Nerves II - XII - II - Visual field intact OU. III, IV, VI - Extraocular movements intact. V - Facial sensation intact bilaterally. VII - left mild lower facial droop. VIII - Hearing & vestibular intact bilaterally. X - Palate elevates symmetrically. XI - Chin turning & shoulder shrug intact bilaterally. XII - Tongue protrusion intact.  Motor Strength - The patient's strength was 5/5 RUE, 3+/5 LUE proximally but 4+/5 distally, 4/5 BLE.  Bulk was normal and fasciculations were absent.   Motor Tone - Muscle tone was assessed at the neck and appendages and was normal.  Reflexes - The patient's reflexes were symmetrical in all extremities and she had no pathological reflexes.  Sensory - Light touch, temperature/pinprick were assessed and were symmetrical.    Coordination - The patient had normal movements in the hands with no ataxia or dysmetria.  Tremor was absent.  Gait and Station - not tested due to safety concerns.   ASSESSMENT/PLAN Ms. Kamdyn Covel is a 39 y.o. female with history of SVT s/p ablation, SLE nephritis, ESRD on HD admitted for worsening renal function. She had prolonged hospital course for AVF, AVG, HD as well as anemia, thrombocytopenia, bleeding from surgical sites, AVF steal and then ligation. She also suffer stroke on 12/21/14. MRI showed right MCA stroke with right M2 cut off. Symptoms much improved. Stroke work up underway.    Stroke: right MCA infarct with M2 occlusion embolic pattern, etiology unclear. DDx include  1. cardioembolic - needs to rule out afib - pt has SVT s/p ablation, with continued palpitation, recently on 30 day cardiac monitoring, pending reports from Dr. Einar Gip - recommend TEE to rule out cardiac thrombus or other valvular  etiology. Depending on the findings, will consider loop recorder later. Understand pt currently not a good candidate for anticoagulation. 2. Paradoxical emboli - pt has recent venous manipulation - if pt has PFO and DVT, then paradoxical emboli is likely. Will need TEE and LE and UE venous doppler. Understand that pt may not be a good candidate for anticoagulation. 3. Antiphospholipid syndrome - pt has SLE and SLE nephritis - needs to rule out APL - recommend hypercoagulable work up - understand that pt currently is not a good candidate for anticoagulation 4. 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 5. Right carotid disease or dissection - carotid doppler not able to see right ICA -  pt is on HD therefore we recommend CTA neck - however, would like primary team and nephrology on the same page though.  MRI  Right MCA infarct  MRA  Right M2 cut off  Carotid Doppler  Left ICA 1-39%, right ICA not able to see well due to technical difficulties  2D Echo  - LVH EF 65-70%  CTA neck pending  LE and UE venous doppler pending  TEE pending  Hypercoagulable work up pending  30 day cardiac event monitoring result pending from Dr. Irven Shelling office  LDL 39  HgbA1c pending  SCDs for VTE prophylaxis  Diet renal with fluid restriction Fluid restriction:: 1500 mL Fluid; Room service appropriate?: Yes; Fluid consistency:: Thin   no antithrombotic prior to admission, now on aspirin 81 mg orally every day.  Patient counseled to be compliant with her antithrombotic medications  Ongoing aggressive stroke risk factor management  Therapy recommendations:  pending  Disposition:  Pending  ESRD on HD  Nephrology on board  VVS working on the access  In terms of procedure timing - if pt  needs to have general anesthesia, we recommend 2-3 weeks post stroke for urgent procedure and 3-6 months for elective procedure. Please avoid hypotension perioperatively due to right M2 occlusion. Pt should be informed about increased risk of recurrent stroke and risk/benefit discussion.   Hypertension  Home meds:   Norvasc, metoprolol BP goal gradual normolization  Currently on metoprolol  Patient counseled to be compliant with her blood pressure medications  Hyperlipidemia  Home meds:  lipitor 20   Currently on lipitor 20  LDL 36, goal < 70  Continue statin at discharge  Other Stroke Risk Factors  Obesity, Body mass index is 38.87 kg/(m^2).   ? OSA  Other Active Problems  Anemia  thrombocytopenia  Other Pertinent History  Pending AVG  Hospital day # 21  Rosalin Hawking, MD PhD Stroke Neurology 12/24/2014 12:32 PM    To contact Stroke Continuity provider, please refer to [email or web address] After hours, contact General Neurology

## 2014-12-24 NOTE — Progress Notes (Signed)
    [facility] has been requested to perform a transesophageal echocardiogram on 12/25/14 for stroke.  After careful review of history and examination, the risks and benefits of transesophageal echocardiogram have been explained to the pt and the pt's mother who was present, including risks of esophageal damage, perforation (1:10,000 risk), bleeding, pharyngeal hematoma as well as other potential complications associated with conscious sedation including aspiration, arrhythmia, respiratory failure and death. Alternatives to treatment were discussed, questions were answered. Patient is willing to proceed. She is scheduled for 9 am with Dr Radford Pax.   Erlene Quan, PA 12/24/2014 4:16 PM

## 2014-12-24 NOTE — Progress Notes (Signed)
Nutrition Follow-up  DOCUMENTATION CODES:   Morbid obesity  INTERVENTION:   -Continue renal MVI -Continue between meal nourishments -30 ml Prostat TID  NUTRITION DIAGNOSIS:   Increased nutrient needs related to chronic illness as evidenced by estimated needs.  Ongoing  GOAL:   Patient will meet greater than or equal to 90% of their needs  Progressing  MONITOR:   PO intake, Supplement acceptance, Weight trends, Labs, I & O's  REASON FOR ASSESSMENT:   Malnutrition Screening Tool    ASSESSMENT:   39 y/o female with PMH of SVT s/p ablation, anemia of chronic disease, HTN, SLE with recently diagnosed lupus nephritis who p/w dysuria * 2 days. Patient states that she recently underwent a renal biopsy on 11/22/14 and was diagnosed with lupus nephritis.  S/p Procedure(s) 12/18/14: INSERTION GORE HYBRID ARTERIOVENOUS GRAFT VS HeRO (N/A)  Ligation of graft  Pt sleeping soundly at time of visit.   Neurology following due to new onset CVA; code stroke called 12/21/14.   Intake remains stable; PO: 50-100%.   Reviewed [facility] note from 12/21/14. Pt has developed two stage II pressure ulcers on buttocks.   Pt scheduled for TEE today per MD notes.   Labs reviewed.  Diet Order:  Diet renal with fluid restriction Fluid restriction:: 1500 mL Fluid; Room service appropriate?: Yes; Fluid consistency:: Thin Diet NPO time specified Except for: Sips with Meds  Skin:  Wound (see comment) (2 stage II wounds to buttocks)  Last BM:  12/23/14  Height:   Ht Readings from Last 1 Encounters:  12/03/14 5\' 3"  (1.6 m)    Weight:   Wt Readings from Last 1 Encounters:  12/22/14 219 lb 5.7 oz (99.5 kg)    Ideal Body Weight:  52.27 kg  BMI:  Body mass index is 38.87 kg/(m^2).  Estimated Nutritional Needs:   Kcal:  6629-4765  Protein:  110-125 grams  Fluid:  1.2 L/day  EDUCATION NEEDS:   No education needs identified at this time  Jenifer A. Jimmye Norman, RD, LDN, CDE Pager:  [phone] After hours Pager: [phone]

## 2014-12-24 NOTE — Progress Notes (Signed)
Patient ID: [name], female   DOB: 12/20/1975, 39 y.o.   MRN: [id number]  [facility] Progress Note   Assessment/ Plan:   1. 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 Currently on TThS schedule, has outpatient HD spot. Plan was for her to get left femoral graft placement today, but neurology recommending waiting a few weeks as she is at risk for recurrent stroke. Surgery has been postponed. After discussion with primary team in regards to thrombocytopenia, will discontinue Cellcept and titrate down on prednisone gradually.   2. 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 Currently on baby aspirin and atorvastatin. All neurological symptoms have resolved. Echo shows EF 65-70% and increased LV hypertrophy, trivial pericardial effusion. No thrombus.  Lipid panel with no abnormalities. HbA1c pending. Unable to visualize right carotid on ultrasound. Neurology wanting to get CTA neck. Hypercoaguable work up underway. Antithrombin III elevated at 133, CRP elevated (3.7), ESR mildly elevated (44).  3. Hypertension: BP 240X-735H systolic. Norvasc and Hydralazine restarted yesterday. Continue Toprol-XL 100 mg daily.  4. Anemia: Hgb stable at 8.2 this AM. S/p 1 unit 9/23. On Aranesp. No bleeding from catheter.  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 PTH level 71. Will need to have PTH monitoring, check in 3 months.  6. Thrombocytopenia: Platelets 89,000>73,000>59,000. Patient received 2 doses of DDAVP early in admission. HIT and DIC panels negative. Will avoid heparin products. Previously on high dose Cellcept (1,500 mg BID) which could be contributing. Infection? TTP in setting of acute progression of renal failure, recent stroke, and anemia? Will discontinue Cellcept and  titrate down on prednisone gradually.  7. Leukopenia: WBC count 1.2 today. She has been as low as 2.9 on 9/23, but previously had leukocytosis when first admitted. Primary team working up for possible infection.    Subjective:   No acute events overnight. Patient disappointed that surgery has to be postponed but understands she is at high risk for recurrent stroke.    Objective:   BP 167/80 mmHg  Pulse 116  Temp(Src) 98.4 F (36.9 C) (Oral)  Resp 18  Ht _0  (1.6 m)  Wt 219 lb 5.7 oz (99.5 kg)  BMI 38.87 kg/m2  SpO2 100%  LMP 11/17/2014 (Approximate)  Intake/Output Summary (Last 24 hours) at 12/24/14 2992 Last data filed at 12/23/14 1816  Gross per 24 hour  Intake    240 ml  Output      0 ml  Net    240 ml   Weight change:   Physical Exam: Gen: alert, resting in bed, pleasant, NAD  CVS: RRR, no m/g/r Resp: CTA bilaterally, breaths non-labored Abd: BS+, soft, obese, non-tender Ext: trace LE edema. Right femoral tunneled dialysis catheter without bleeding Neuro: alert and oriented x 4, CNs II-XII grossly intact  Imaging: No results found.  Labs: BMET  Recent Labs Lab 12/18/14 0455 12/19/14 0541 12/20/14 0410 12/21/14 0500 12/22/14 0441 12/23/14 0636 12/24/14 0500  NA 135 134* 133* 133* 137 135 135  K 4.3 4.3 4.3 4.0 4.3 4.0 3.9  CL 98* 98* 98* 98* 100* 97* 100*  CO2 _1 GLUCOSE 136* 145* 165* 133* 125* 120* 91  BUN 56* 76* 85* 44* 65* 40* 52*  CREATININE 4.65* 5.70* 5.99* 4.16* 5.30* 3.75* 4.47*  CALCIUM 8.2* 8.0* 8.0*  7.7* 8.2* 8.4* 8.3*  PHOS 5.6* 6.6* 6.7* 4.8* 6.2* 4.5 4.5   CBC  Recent Labs Lab 12/21/14 0500 12/21/14 1950 12/22/14 0725 12/23/14 0636 12/24/14 0653  WBC 2.9*  --  3.5* 4.5 1.2*  HGB 6.7* 8.5* 8.3* 9.2* 8.2*  HCT 20.3* 25.5* 25.1* 27.4* 25.1*  MCV 90.6  --  88.7 89.8 90.3  PLT 72*  --  89* 73* 59*    Medications:    . amLODipine  10 mg Oral Daily  . aspirin  81 mg Oral Daily  . atorvastatin  20 mg Oral  q1800  . calcium acetate  1,334 mg Oral TID WC  . cefUROXime (ZINACEF)  IV  1.5 g Intravenous To SS-Surg  . cyclobenzaprine  10 mg Oral QHS  . [START ON 01/03/2015] darbepoetin (ARANESP) injection - DIALYSIS  100 mcg Intravenous Q Thu-HD  . famotidine  20 mg Oral Daily  . folic acid  1 mg Oral Daily  . hydrALAZINE  50 mg Oral 3 times per day  . isosorbide mononitrate  30 mg Oral Daily  . metoprolol succinate  100 mg Oral Daily  . multivitamin  1 tablet Oral QHS  . mycophenolate  1,000 mg Oral BID  . predniSONE  20 mg Oral TID   [name], MD, MPH Internal Medicine PGY-2  12/24/2014, 8:12 AM

## 2014-12-25 ENCOUNTER — Inpatient Hospital Stay (HOSPITAL_COMMUNITY): Payer: Medicaid Other

## 2014-12-25 ENCOUNTER — Encounter (HOSPITAL_COMMUNITY)
Admission: EM | Disposition: A | Discharge status: Rehabilitation Facility | Payer: Self-pay | Source: Home / Self Care | Attending: Internal Medicine

## 2014-12-25 ENCOUNTER — Inpatient Hospital Stay (HOSPITAL_COMMUNITY)
Admit: 2014-12-25 | Discharge: 2014-12-25 | Disposition: A | Discharge status: Home / Self Care | Payer: Medicaid Other | Attending: Cardiology | Admitting: Cardiology

## 2014-12-25 ENCOUNTER — Encounter (HOSPITAL_COMMUNITY): Payer: Self-pay | Admitting: General Practice

## 2014-12-25 DIAGNOSIS — I361 Nonrheumatic tricuspid (valve) insufficiency: Secondary | ICD-10-CM

## 2014-12-25 DIAGNOSIS — I059 Rheumatic mitral valve disease, unspecified: Secondary | ICD-10-CM | POA: Diagnosis present

## 2014-12-25 DIAGNOSIS — I058 Other rheumatic mitral valve diseases: Secondary | ICD-10-CM

## 2014-12-25 HISTORY — PX: TEE WITHOUT CARDIOVERSION: SHX5443

## 2014-12-25 LAB — SJOGRENS SYNDROME-A EXTRACTABLE NUCLEAR ANTIBODY: SSA (RO) (ENA) ANTIBODY, IGG: 0.3 AI (ref 0.0–0.9)

## 2014-12-25 LAB — RENAL FUNCTION PANEL
ANION GAP: 13 (ref 5–15)
Albumin: 2 g/dL — ABNORMAL LOW (ref 3.5–5.0)
BUN: 60 mg/dL — ABNORMAL HIGH (ref 6–20)
CHLORIDE: 96 mmol/L — AB (ref 101–111)
CO2: 23 mmol/L (ref 22–32)
CREATININE: 5.03 mg/dL — AB (ref 0.44–1.00)
Calcium: 8.3 mg/dL — ABNORMAL LOW (ref 8.9–10.3)
GFR, EST AFRICAN AMERICAN: 12 mL/min — AB (ref >60)
GFR, EST NON AFRICAN AMERICAN: 10 mL/min — AB (ref >60)
Glucose, Bld: 109 mg/dL — ABNORMAL HIGH (ref 65–99)
POTASSIUM: 4.3 mmol/L (ref 3.5–5.1)
Phosphorus: 6.6 mg/dL — ABNORMAL HIGH (ref 2.5–4.6)
Sodium: 132 mmol/L — ABNORMAL LOW (ref 135–145)

## 2014-12-25 LAB — DIFFERENTIAL
BASOS ABS: 0 10*3/uL (ref 0.0–0.1)
BASOS PCT: 0 %
Eosinophils Absolute: 0 10*3/uL (ref 0.0–0.7)
Eosinophils Relative: 0 %
LYMPHS PCT: 2 %
Lymphs Abs: 0.1 10*3/uL — ABNORMAL LOW (ref 0.7–4.0)
MONO ABS: 0.1 10*3/uL (ref 0.1–1.0)
Monocytes Relative: 4 %
NEUTROS ABS: 2.5 10*3/uL (ref 1.7–7.7)
NEUTROS PCT: 94 %

## 2014-12-25 LAB — CBC
HCT: 22.2 % — ABNORMAL LOW (ref 36.0–46.0)
Hemoglobin: 7.6 g/dL — ABNORMAL LOW (ref 12.0–15.0)
MCH: 30.3 pg (ref 26.0–34.0)
MCHC: 34.2 g/dL (ref 30.0–36.0)
MCV: 88.4 fL (ref 78.0–100.0)
PLATELETS: 67 10*3/uL — AB (ref 150–400)
RBC: 2.51 MIL/uL — AB (ref 3.87–5.11)
RDW: 14.5 % (ref 11.5–15.5)
WBC: 3.5 10*3/uL — AB (ref 4.0–10.5)

## 2014-12-25 LAB — ANTI-SMITH ANTIBODY: ENA SM Ab Ser-aCnc: >8 AI — ABNORMAL HIGH (ref 0.0–0.9)

## 2014-12-25 LAB — SICKLE CELL SCREEN: Sickle Cell Screen: NEGATIVE

## 2014-12-25 LAB — SJOGRENS SYNDROME-B EXTRACTABLE NUCLEAR ANTIBODY: SSB (LA) (ENA) ANTIBODY, IGG: 0.2 AI (ref 0.0–0.9)

## 2014-12-25 LAB — CARDIOLIPIN ANTIBODIES, IGG, IGM, IGA

## 2014-12-25 LAB — BETA-2-GLYCOPROTEIN I ABS, IGG/M/A
Beta-2 Glyco I IgG: <9 GPI IgG units (ref 0–20)
Beta-2-Glycoprotein I IgA: <9 GPI IgA units (ref 0–25)
Beta-2-Glycoprotein I IgM: <9 GPI IgM units (ref 0–32)

## 2014-12-25 LAB — ANTI-DNA ANTIBODY, DOUBLE-STRANDED: DS DNA AB: 18 [IU]/mL — AB (ref 0–9)

## 2014-12-25 LAB — SAVE SMEAR

## 2014-12-25 LAB — PROTEIN C, TOTAL: Protein C, Total: 152 % — ABNORMAL HIGH (ref 70–140)

## 2014-12-25 LAB — HOMOCYSTEINE: Homocysteine: 29.7 umol/L — ABNORMAL HIGH (ref 0.0–15.0)

## 2014-12-25 LAB — HAPTOGLOBIN: Haptoglobin: 163 mg/dL (ref 34–200)

## 2014-12-25 SURGERY — ECHOCARDIOGRAM, TRANSESOPHAGEAL
Anesthesia: Moderate Sedation

## 2014-12-25 MED ORDER — LIDOCAINE-PRILOCAINE 2.5-2.5 % EX CREA
1.0000 "application " | TOPICAL_CREAM | CUTANEOUS | Status: DC | PRN
  Filled 2014-12-25: qty 5

## 2014-12-25 MED ORDER — SODIUM CHLORIDE 0.9 % IV SOLN: 100.0000 mL | INTRAVENOUS | Status: DC | PRN

## 2014-12-25 MED ORDER — BUTAMBEN-TETRACAINE-BENZOCAINE 2-2-14 % EX AERO
INHALATION_SPRAY | CUTANEOUS | Status: DC | PRN
  Administered 2014-12-25: 2 via TOPICAL

## 2014-12-25 MED ORDER — VANCOMYCIN HCL 10 G IV SOLR
2000.0000 mg | INTRAVENOUS | Status: AC
  Administered 2014-12-25: 2000 mg via INTRAVENOUS
  Filled 2014-12-25: qty 2000

## 2014-12-25 MED ORDER — MIDAZOLAM HCL 5 MG/ML IJ SOLN
INTRAMUSCULAR | Status: AC
  Filled 2014-12-25: qty 2

## 2014-12-25 MED ORDER — FENTANYL CITRATE (PF) 100 MCG/2ML IJ SOLN
INTRAMUSCULAR | Status: AC
  Filled 2014-12-25: qty 2

## 2014-12-25 MED ORDER — FENTANYL CITRATE (PF) 100 MCG/2ML IJ SOLN
INTRAMUSCULAR | Status: DC | PRN
  Administered 2014-12-25: 25 ug via INTRAVENOUS

## 2014-12-25 MED ORDER — ALTEPLASE 2 MG IJ SOLR
2.0000 mg | Freq: Once | INTRAMUSCULAR | Status: DC | PRN
  Filled 2014-12-25: qty 2

## 2014-12-25 MED ORDER — LIDOCAINE HCL (PF) 1 % IJ SOLN: 5.0000 mL | INTRAMUSCULAR | Status: DC | PRN

## 2014-12-25 MED ORDER — PREDNISONE 20 MG PO TABS
20.0000 mg | ORAL_TABLET | Freq: Two times a day (BID) | ORAL | Status: DC
  Administered 2014-12-25 – 2014-12-26 (×4): 20 mg via ORAL
  Filled 2014-12-25 (×4): qty 1

## 2014-12-25 MED ORDER — MIDAZOLAM HCL 10 MG/2ML IJ SOLN
INTRAMUSCULAR | Status: DC | PRN
  Administered 2014-12-25: 2 mg via INTRAVENOUS
  Administered 2014-12-25: 1 mg via INTRAVENOUS

## 2014-12-25 MED ORDER — PENTAFLUOROPROP-TETRAFLUOROETH EX AERO: 1.0000 "application " | INHALATION_SPRAY | CUTANEOUS | Status: DC | PRN

## 2014-12-25 MED ORDER — HEPARIN SODIUM (PORCINE) 1000 UNIT/ML DIALYSIS: 1000.0000 [IU] | INTRAMUSCULAR | Status: DC | PRN

## 2014-12-25 MED ORDER — DIPHENHYDRAMINE HCL 50 MG/ML IJ SOLN
INTRAMUSCULAR | Status: AC
  Filled 2014-12-25: qty 1

## 2014-12-25 NOTE — Progress Notes (Signed)
ANTIBIOTIC CONSULT NOTE - INITIAL  Pharmacy Consult for Vancomycin Indication: endocarditis  Allergies  Allergen Reactions  . Food Swelling    Red peppers    Patient Measurements: Height: 5\' 3"  (160 cm) Weight: 232 lb 5.8 oz (105.4 kg) IBW/kg (Calculated) : 52.4 Adjusted Body Weight:   Vital Signs: Temp: 98 F (36.7 C) (09/27 1320) Temp Source: Oral (09/27 0816) BP: 118/78 mmHg (09/27 1630) Pulse Rate: 107 (09/27 1630) Intake/Output from previous day: 09/26 0701 - 09/27 0700 In: 20 [I.V.:20] Out: -  Intake/Output from this shift: Total I/O In: 300 [P.O.:300] Out: -   Labs:  Recent Labs  12/23/14 0636 12/24/14 0500 12/24/14 0653 12/24/14 1110 12/25/14 0440 12/25/14 1530  WBC 4.5  --  1.2* 1.7*  --  3.5*  HGB 9.2*  --  8.2* 7.5*  --  7.6*  PLT 73*  --  59* 56*  --  67*  CREATININE 3.75* 4.47*  --   --  5.03*  --    Estimated Creatinine Clearance: 17.6 mL/min (by C-G formula based on Cr of 5.03). No results for input(s): VANCOTROUGH, VANCOPEAK, VANCORANDOM, GENTTROUGH, GENTPEAK, GENTRANDOM, TOBRATROUGH, TOBRAPEAK, TOBRARND, AMIKACINPEAK, AMIKACINTROU, AMIKACIN in the last 72 hours.   Microbiology: Recent Results (from the past 720 hour(s))  Urine culture     Status: None   Collection Time: 12/03/14  1:44 PM  Result Value Ref Range Status   Specimen Description URINE, CLEAN CATCH  Final   Special Requests Normal  Final   Culture >=100,000 COLONIES/mL KLEBSIELLA PNEUMONIAE  Final   Report Status 12/06/2014 FINAL  Final   Organism ID, Bacteria KLEBSIELLA PNEUMONIAE  Final      Susceptibility   Klebsiella pneumoniae - MIC*    AMPICILLIN 4 RESISTANT Resistant     CEFAZOLIN <=4 SENSITIVE Sensitive     CEFTRIAXONE <=1 SENSITIVE Sensitive     CIPROFLOXACIN <=0.25 SENSITIVE Sensitive     GENTAMICIN <=1 SENSITIVE Sensitive     IMIPENEM <=0.25 SENSITIVE Sensitive     NITROFURANTOIN 64 INTERMEDIATE Intermediate     TRIMETH/SULFA <=20 SENSITIVE Sensitive      AMPICILLIN/SULBACTAM 4 SENSITIVE Sensitive     PIP/TAZO 8 SENSITIVE Sensitive     * >=100,000 COLONIES/mL KLEBSIELLA PNEUMONIAE  Surgical pcr screen     Status: None   Collection Time: 12/18/14  7:56 AM  Result Value Ref Range Status   MRSA, PCR NEGATIVE NEGATIVE Final   Staphylococcus aureus NEGATIVE NEGATIVE Final    Comment:        The Xpert SA Assay (FDA approved for NASAL specimens in patients over 84 years of age), is one component of a comprehensive surveillance program.  Test performance has been validated by [facility] for patients greater than or equal to 54 year old. It is not intended to diagnose infection nor to guide or monitor treatment.   Culture, blood (routine x 2)     Status: None (Preliminary result)   Collection Time: 12/24/14 12:26 PM  Result Value Ref Range Status   Specimen Description BLOOD LEFT HAND  Final   Special Requests IN PEDIATRIC BOTTLE  1CC  Final   Culture NO GROWTH 1 DAY  Final   Report Status PENDING  Incomplete  Culture, blood (routine x 2)     Status: None (Preliminary result)   Collection Time: 12/24/14 12:40 PM  Result Value Ref Range Status   Specimen Description BLOOD RIGHT HAND  Final   Special Requests BAA 5CCS  Final   Culture NO  GROWTH 1 DAY  Final   Report Status PENDING  Incomplete  Culture, blood (routine x 2)     Status: None (Preliminary result)   Collection Time: 12/25/14  1:20 PM  Result Value Ref Range Status   Specimen Description BLOOD HEMODIALYSIS CATHETER  Final   Special Requests BOTTLES DRAWN AEROBIC AND ANAEROBIC 10CC  Final   Culture PENDING  Incomplete   Report Status PENDING  Incomplete    Medical History: Past Medical History  Diagnosis Date  . Hypertension   . Cardiac arrhythmia   . SVT (supraventricular tachycardia)     "today, last week, 2 wk ago; maybe 1 month ago, etc; started w/in last 3-4 yrs"(07/20/2012)  . Fainting     "~ 1 month ago; probably related to SVT" (07/20/2012)  . Heart murmur      "small" (07/20/2012)  . Shortness of breath     "related to SVT episodes" (07/20/2012)  . Anemia     "today" (07/20/2012)  . Chest pain at rest     "related to SVT" (07/20/2012)  . Sleep apnea     "don't wear mask" (07/20/2012)  . Daily headache   . Anxiety   . Lupus (systemic lupus erythematosus)   . Renal disorder   . Chronic renal failure, stage 4 (severe)   . GERD (gastroesophageal reflux disease)   . Fibromyalgia   . Irritable bowel syndrome (IBS)   . Renal insufficiency     Medications:  Prescriptions prior to admission  Medication Sig Dispense Refill Last Dose  . acetaminophen (TYLENOL) 500 MG tablet Take 1,000 mg by mouth every 6 (six) hours as needed (pain).    12/02/2014 at Unknown time  . alendronate (FOSAMAX) 70 MG tablet Take 70 mg by mouth once a week. Take on Wednesdays with a full glass of water on an empty stomach.   11/28/2014  . amLODipine (NORVASC) 10 MG tablet Take 1 tablet (10 mg total) by mouth daily. (Patient taking differently: Take 10 mg by mouth 2 (two) times daily. ) 30 tablet 2 12/02/2014 at Unknown time  . Calcium Carbonate Antacid (TUMS PO) Take 1-2 tablets by mouth daily as needed (heartburn).   12/02/2014 at Unknown time  . cyclobenzaprine (FLEXERIL) 10 MG tablet Take 10 mg by mouth at bedtime as needed for muscle spasms.    12/02/2014 at Unknown time  . famotidine (PEPCID) 40 MG tablet Take 1 tablet (40 mg total) by mouth daily. 20 tablet 0 12/02/2014 at Unknown time  . mycophenolate (CELLCEPT) 500 MG tablet Take 1,500 mg by mouth 2 (two) times daily.    12/02/2014 at Unknown time  . ondansetron (ZOFRAN ODT) 4 MG disintegrating tablet 4mg  ODT q4 hours prn nausea/vomit 10 tablet 0 12/02/2014 at Unknown time  . oxyCODONE-acetaminophen (PERCOCET) 5-325 MG per tablet Take 1 tablet by mouth every 4 (four) hours as needed for severe pain. 20 tablet 0 12/02/2014 at Unknown time  . predniSONE (DELTASONE) 20 MG tablet Take 20 mg by mouth 3 (three) times daily.    12/02/2014 at  Unknown time  . simvastatin (ZOCOR) 40 MG tablet Take 40 mg by mouth daily.   12/02/2014 at Unknown time  . [DISCONTINUED] metoprolol succinate (TOPROL-XL) 25 MG 24 hr tablet Take 25 mg by mouth daily.   12/02/2014 at 0800   Scheduled:  . amLODipine  10 mg Oral Daily  . aspirin  81 mg Oral Daily  . atorvastatin  20 mg Oral q1800  . calcium acetate  1,334  mg Oral TID WC  . cyclobenzaprine  10 mg Oral QHS  . [START ON 01/03/2015] darbepoetin (ARANESP) injection - DIALYSIS  100 mcg Intravenous Q Thu-HD  . famotidine  20 mg Oral Daily  . feeding supplement (PRO-STAT SUGAR FREE 64)  30 mL Oral TID BM  . folic acid  1 mg Oral Daily  . hydrALAZINE  50 mg Oral 3 times per day  . isosorbide mononitrate  30 mg Oral Daily  . metoprolol succinate  100 mg Oral Daily  . multivitamin  1 tablet Oral QHS  . predniSONE  20 mg Oral BID   Infusions:  . sodium chloride 20 mL/hr at 12/24/14 0756   Assessment: 39yo female with history of ERSD on HD (TThS), HTN and SLE here for acute renal failure 2/2 lupus nephritis and developing stroke symptoms. Pharmacy is consulted to dose vancomycin for endocarditis. Valvular vegetation revealed on TEE today. Pt is afebrile, WBC 3.5, sCr 5.03,   Goal of Therapy:  Vancomycin trough level 15-25 mcg/ml  Plan:  Vancomycin 2g IV load followed by 1g qHD Measure antibiotic drug levels at steady state Follow up culture results, HD plans and clinical course  Andrey Cota. Diona Foley, PharmD Clinical Pharmacist Pager [phone] 12/25/2014,5:01 PM

## 2014-12-25 NOTE — CV Procedure (Signed)
    PROCEDURE NOTE:  Procedure:  Transesophageal echocardiogram Operator:  Fransico Him, MD Indications:  CVA Complications: None IV Meds:Versed 3mg , Fentanyl 34mcg IV  Results: Normal LV size and function Normal RV size and function Mildly dilated  RA Mild to moderately dilated LA Normal TV with mild TR Normal PV Calcified MV annulus.  There is shaggy mobile density that emenates off the lateral aspect of the posterior mitral valve leaflet near the annulus measuring 0.7 x 0.5 cm in diameter.  This is consistent with vegetation.  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 Normal trileaflet AV with trivial AR Normal interatrial septum with no evidence of shunt by colorflow dopper or agitated saline contrast injection.   Normal thoracic and ascending aorta.  The patient tolerated the procedure well and was transferred back to their room in stable condition.  Signed: Fransico Him, MD [facility]

## 2014-12-25 NOTE — H&P (View-Only) (Signed)
Nutrition Follow-up  DOCUMENTATION CODES:   Morbid obesity  INTERVENTION:   -Continue renal MVI -Continue between meal nourishments -30 ml Prostat TID  NUTRITION DIAGNOSIS:   Increased nutrient needs related to chronic illness as evidenced by estimated needs.  Ongoing  GOAL:   Patient will meet greater than or equal to 90% of their needs  Progressing  MONITOR:   PO intake, Supplement acceptance, Weight trends, Labs, I & O's  REASON FOR ASSESSMENT:   Malnutrition Screening Tool    ASSESSMENT:   39 y/o female with PMH of SVT s/p ablation, anemia of chronic disease, HTN, SLE with recently diagnosed lupus nephritis who p/w dysuria * 2 days. Patient states that she recently underwent a renal biopsy on 11/22/14 and was diagnosed with lupus nephritis.  S/p Procedure(s) 12/18/14: INSERTION GORE HYBRID ARTERIOVENOUS GRAFT VS HeRO (N/A)  Ligation of graft  Pt sleeping soundly at time of visit.   Neurology following due to new onset CVA; code stroke called 12/21/14.   Intake remains stable; PO: 50-100%.   Reviewed [facility] note from 12/21/14. Pt has developed two stage II pressure ulcers on buttocks.   Pt scheduled for TEE today per MD notes.   Labs reviewed.  Diet Order:  Diet renal with fluid restriction Fluid restriction:: 1500 mL Fluid; Room service appropriate?: Yes; Fluid consistency:: Thin Diet NPO time specified Except for: Sips with Meds  Skin:  Wound (see comment) (2 stage II wounds to buttocks)  Last BM:  12/23/14  Height:   Ht Readings from Last 1 Encounters:  12/03/14 5\' 3"  (1.6 m)    Weight:   Wt Readings from Last 1 Encounters:  12/22/14 219 lb 5.7 oz (99.5 kg)    Ideal Body Weight:  52.27 kg  BMI:  Body mass index is 38.87 kg/(m^2).  Estimated Nutritional Needs:   Kcal:  6546-5035  Protein:  110-125 grams  Fluid:  1.2 L/day  EDUCATION NEEDS:   No education needs identified at this time  [name], RD, LDN, CDE Pager:  [phone] After hours Pager: [phone]

## 2014-12-25 NOTE — Interval H&P Note (Signed)
History and Physical Interval Note:  12/25/2014 9:36 AM  [name]  has presented today for surgery, with the diagnosis of STROKE  The various methods of treatment have been discussed with the patient and family. After consideration of risks, benefits and other options for treatment, the patient has consented to  Procedure(s): TRANSESOPHAGEAL ECHOCARDIOGRAM (TEE) (N/A) as a surgical intervention .  The patient's history has been reviewed, patient examined, no change in status, stable for surgery.  I have reviewed the patient's chart and labs.  Questions were answered to the patient's satisfaction.     TURNER,TRACI R

## 2014-12-25 NOTE — H&P (View-Only) (Signed)
STROKE TEAM PROGRESS NOTE   SUBJECTIVE (INTERVAL HISTORY) No family is at the bedside.  Overall she feels her condition is stable. She seems in depressed mood and just cried before I went into her room. She denies any change of weakness at right arm or leg. She stated that she still has a little weakness in her left leg.    OBJECTIVE Temp:  [97.3 F (36.3 C)-98.4 F (36.9 C)] 98.4 F (36.9 C) (09/26 0416) Pulse Rate:  [91-116] 113 (09/26 1102) Cardiac Rhythm:  [-] Sinus tachycardia (09/26 0704) Resp:  [18-19] 18 (09/26 1102) BP: (127-184)/(67-91) 127/67 mmHg (09/26 1102) SpO2:  [100 %] 100 % (09/25 2121)   Recent Labs Lab 12/21/14 1138  GLUCAP 117*    Recent Labs Lab 12/20/14 0410 12/21/14 0500 12/22/14 0441 12/23/14 0636 12/24/14 0500  NA 133* 133* 137 135 135  K 4.3 4.0 4.3 4.0 3.9  CL 98* 98* 100* 97* 100*  CO2 23 27 26 27 26   GLUCOSE 165* 133* 125* 120* 91  BUN 85* 44* 65* 40* 52*  CREATININE 5.99* 4.16* 5.30* 3.75* 4.47*  CALCIUM 8.0* 7.7* 8.2* 8.4* 8.3*  PHOS 6.7* 4.8* 6.2* 4.5 4.5    Recent Labs Lab 12/20/14 0410 12/21/14 0500 12/22/14 0441 12/23/14 0636 12/24/14 0500  ALBUMIN 2.6* 2.4* 2.6* 3.0* 2.5*    Recent Labs Lab 12/21/14 0500 12/21/14 1950 12/22/14 0725 12/23/14 0636 12/24/14 0653 12/24/14 1110  WBC 2.9*  --  3.5* 4.5 1.2* 1.7*  NEUTROABS  --   --   --   --   --  PENDING  HGB 6.7* 8.5* 8.3* 9.2* 8.2* 7.5*  HCT 20.3* 25.5* 25.1* 27.4* 25.1* 22.6*  MCV 90.6  --  88.7 89.8 90.3 90.0  PLT 72*  --  89* 73* 59* 56*   No results for input(s): CKTOTAL, CKMB, CKMBINDEX, TROPONINI in the last 168 hours. No results for input(s): LABPROT, INR in the last 72 hours. No results for input(s): COLORURINE, LABSPEC, Tensed, GLUCOSEU, HGBUR, BILIRUBINUR, KETONESUR, PROTEINUR, UROBILINOGEN, NITRITE, LEUKOCYTESUR in the last 72 hours.  Invalid input(s): APPERANCEUR     Component Value Date/Time   CHOL 113 12/21/2014 1638   TRIG 107 12/21/2014 1638    HDL 53 12/21/2014 1638   CHOLHDL 2.1 12/21/2014 1638   VLDL 21 12/21/2014 1638   LDLCALC 39 12/21/2014 1638   No results found for: HGBA1C    Component Value Date/Time   LABOPIA NONE DETECTED 07/21/2012 0215   COCAINSCRNUR NONE DETECTED 07/21/2012 0215   LABBENZ NONE DETECTED 07/21/2012 0215   AMPHETMU NONE DETECTED 07/21/2012 0215   THCU NONE DETECTED 07/21/2012 0215   LABBARB NONE DETECTED 07/21/2012 0215    No results for input(s): ETH in the last 168 hours.  I have personally reviewed the radiological images below and agree with the radiology interpretations.  Ct Head Wo Contrast  12/21/2014    IMPRESSION: Normal head CT.    Mri and Mra Head Wo Contrast  12/21/2014    IMPRESSION: 1. 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 No hemorrhage or mass effect. 2. Otherwise negative intracranial MRI and MRA.   2D echo - - Compared to the prior echo in 2014, there is now severe LV wall thickening. The EF is higher at 65-70%. A trivial pericardial effusion persists.  CUS - Study was technically limited due to patient anatomy, depth of vessels, and patient body habitus. Findings suggest 1-39% left internal carotid artery stenosis. Unable to  adequately evaluate the right internal carotid artery, therefore cannot exclude stenosis. Vertebral arteries are patent with antegrade flow.  LE and UE venous doppler pending  CTA neck pending  TEE pending   PHYSICAL EXAM  Temp:  [97.3 F (36.3 C)-98.4 F (36.9 C)] 98.4 F (36.9 C) (09/26 0416) Pulse Rate:  [91-116] 113 (09/26 1102) Resp:  [18-19] 18 (09/26 1102) BP: (127-184)/(67-91) 127/67 mmHg (09/26 1102) SpO2:  [100 %] 100 % (09/25 2121)  General - morbid obesity, full moon face, well developed, in no apparent distress, but depressed mood.  Ophthalmologic - fundi not visualized due to eye movement.  Cardiovascular - Regular rate and rhythm.  Mental Status -  Level of  arousal and orientation to time, place, and person were intact. Language including expression, naming, repetition, comprehension was assessed and found intact. Fund of Knowledge was assessed and was impaired.  Cranial Nerves II - XII - II - Visual field intact OU. III, IV, VI - Extraocular movements intact. V - Facial sensation intact bilaterally. VII - left mild lower facial droop. VIII - Hearing & vestibular intact bilaterally. X - Palate elevates symmetrically. XI - Chin turning & shoulder shrug intact bilaterally. XII - Tongue protrusion intact.  Motor Strength - The patient's strength was 5/5 RUE, 3+/5 LUE proximally but 4+/5 distally, 4/5 BLE.  Bulk was normal and fasciculations were absent.   Motor Tone - Muscle tone was assessed at the neck and appendages and was normal.  Reflexes - The patient's reflexes were symmetrical in all extremities and she had no pathological reflexes.  Sensory - Light touch, temperature/pinprick were assessed and were symmetrical.    Coordination - The patient had normal movements in the hands with no ataxia or dysmetria.  Tremor was absent.  Gait and Station - not tested due to safety concerns.   ASSESSMENT/PLAN Ms. Keith Cancio is a 39 y.o. female with history of SVT s/p ablation, SLE nephritis, ESRD on HD admitted for worsening renal function. She had prolonged hospital course for AVF, AVG, HD as well as anemia, thrombocytopenia, bleeding from surgical sites, AVF steal and then ligation. She also suffer stroke on 12/21/14. MRI showed right MCA stroke with right M2 cut off. Symptoms much improved. Stroke work up underway.    Stroke: right MCA infarct with M2 occlusion embolic pattern, etiology unclear. DDx include  1. cardioembolic - needs to rule out afib - pt has SVT s/p ablation, with continued palpitation, recently on 30 day cardiac monitoring, pending reports from Dr. Einar Gip - recommend TEE to rule out cardiac thrombus or other valvular  etiology. Depending on the findings, will consider loop recorder later. Understand pt currently not a good candidate for anticoagulation. 2. Paradoxical emboli - pt has recent venous manipulation - if pt has PFO and DVT, then paradoxical emboli is likely. Will need TEE and LE and UE venous doppler. Understand that pt may not be a good candidate for anticoagulation. 3. Antiphospholipid syndrome - pt has SLE and SLE nephritis - needs to rule out APL - recommend hypercoagulable work up - understand that pt currently is not a good candidate for anticoagulation 4. 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 5. Right carotid disease or dissection - carotid doppler not able to see right ICA -  pt is on HD therefore we recommend CTA neck - however, would like primary team and nephrology on the same page though.  MRI  Right MCA infarct  MRA  Right M2 cut off  Carotid Doppler  Left ICA 1-39%, right ICA not able to see well due to technical difficulties  2D Echo  - LVH EF 65-70%  CTA neck pending  LE and UE venous doppler pending  TEE pending  Hypercoagulable work up pending  30 day cardiac event monitoring result pending from Dr. Irven Shelling office  LDL 39  HgbA1c pending  SCDs for VTE prophylaxis  Diet renal with fluid restriction Fluid restriction:: 1500 mL Fluid; Room service appropriate?: Yes; Fluid consistency:: Thin   no antithrombotic prior to admission, now on aspirin 81 mg orally every day.  Patient counseled to be compliant with her antithrombotic medications  Ongoing aggressive stroke risk factor management  Therapy recommendations:  pending  Disposition:  Pending  ESRD on HD  Nephrology on board  VVS working on the access  In terms of procedure timing - if pt  needs to have general anesthesia, we recommend 2-3 weeks post stroke for urgent procedure and 3-6 months for elective procedure. Please avoid hypotension perioperatively due to right M2 occlusion. Pt should be informed about increased risk of recurrent stroke and risk/benefit discussion.   Hypertension  Home meds:   Norvasc, metoprolol BP goal gradual normolization  Currently on metoprolol  Patient counseled to be compliant with her blood pressure medications  Hyperlipidemia  Home meds:  lipitor 20   Currently on lipitor 20  LDL 36, goal < 70  Continue statin at discharge  Other Stroke Risk Factors  Obesity, Body mass index is 38.87 kg/(m^2).   ? OSA  Other Active Problems  Anemia  thrombocytopenia  Other Pertinent History  Pending AVG  Hospital day # 21  Rosalin Hawking, MD PhD Stroke Neurology 12/24/2014 12:32 PM    To contact Stroke Continuity provider, please refer to [email or web address] After hours, contact General Neurology

## 2014-12-25 NOTE — Progress Notes (Addendum)
PT Cancellation Note  Patient Details Name: [name] MRN: [id number] DOB: Nov 25, 1975   Cancelled Treatment:    Reason Eval/Treat Not Completed: Patient at procedure or test/unavailable. Pt off of the unit this AM.  Will check back as schedule permits.  Returned later and pt was back on the unit, but nurse reported that pt about to go to dialysis.  Will check back tomorrow to attempt PT evaluation.  [name],[name] 12/25/2014, 11:06 AM

## 2014-12-25 NOTE — Progress Notes (Signed)
Patient ID: [name], female   DOB: 1975/07/08, 39 y.o.   MRN: [id number]  [facility] Progress Note   Assessment/ Plan:   1. 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 Currently on TThS schedule, has outpatient HD spot. Placement of left thigh graft postponed given recent stroke and falling platelets. HD today. Titrating down prednisone to 20 mg BID.  2. 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 Currently on baby aspirin and atorvastatin. Echo shows EF 65-70% and increased LV hypertrophy, trivial pericardial effusion. No thrombus.  Lipid panel with no abnormalities. HbA1c 6.1. Unable to visualize right carotid on ultrasound so CTA neck obtained which shows no significant carotid disease. Hypercoaguable work up underway. Antithrombin III elevated at 133, CRP elevated (3.7), ESR mildly elevated (44). Patient had TEE today which showed a calcified MV annulus with a "shaggy mobile density that emenates off the lateral aspect of the posterior mitral valve leaflet near the annulus measuring 0.7 x 0.5 cm in diameter. This is consistent with vegetation." Vegetation could be sterile (2/2 lupus) vs endocarditis. Primary team working up.  3. Hypertension: BP better today, in 833A-250N systolic. Continue Toprol-XL, Norvasc, and Hydralazine.  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 PTH level 71. Will need to have PTH monitoring, check in 3 months.  5. Pancytopenia: Platelets 89,000>73,000>59,000. Hgb 8.2 and WBC 1.2 yesterday. Awaiting labs from today. Dr. Beryle Beams (hematology) consulted and does not believe there is evidence of TTP. Suspects that her pancytopenia is likely due to bone marrow suppression from CellCept vs occult infection vs immune mediated secondary to  lupus. CellCept discontinued on 9/26. We suggested to gradually taper Prednisone as well. Continue Aranesp. Primary team evaluating for possible infection (parvovirus pending). Direct Coombs neg, haptoglobin 163 (normal), and LDH elevated at 309 but nonspecific. Peripheral blood smear with no schistocytes to suggest hemolysis. There are some toxic changes that may indicate possible infection.    Subjective:   No acute events overnight. Patient quiet this AM. States her TEE went fine.    Objective:   BP 141/81 mmHg  Pulse 82  Temp(Src) 97.8 F (36.6 C) (Oral)  Resp 17  Ht 5' 3"  (1.6 m)  Wt 232 lb 5.8 oz (105.4 kg)  BMI 41.17 kg/m2  SpO2 100%  LMP 11/17/2014 (Approximate)  Intake/Output Summary (Last 24 hours) at 12/25/14 0745 Last data filed at 12/25/14 0439  Gross per 24 hour  Intake     20 ml  Output      0 ml  Net     20 ml   Weight change:   Physical Exam: Gen: alert, resting in bed, pleasant, NAD  CVS: RRR, no m/g/r Resp: CTA bilaterally, breaths non-labored Abd: BS+, soft, obese, non-tender Ext: trace LE edema. Right femoral tunneled dialysis catheter without bleeding Neuro: alert and oriented x 4, CNs II-XII grossly intact  Imaging: Ct Angio Neck W/cm &/or Wo/cm  12/24/2014   CLINICAL DATA:  Recent RIGHT brain infarct. Unable to adequately visualize RICA/Right carotid bifurcation on ultrasound.  EXAM: CT ANGIOGRAPHY NECK  TECHNIQUE: Multidetector CT imaging of the neck was performed using the standard protocol during bolus administration of intravenous contrast. Multiplanar CT image reconstructions and MIPs were obtained to evaluate the vascular anatomy. Carotid stenosis measurements (when applicable) are obtained utilizing NASCET criteria, using the distal internal carotid  diameter as the denominator.  CONTRAST:  43m OMNIPAQUE IOHEXOL 350 MG/ML SOLN  COMPARISON:  MR brain 12/21/2014.  FINDINGS: Aortic arch: Standard branching. Imaged portion shows no evidence of aneurysm  or dissection. No significant stenosis of the major arch vessel origins.  Right carotid system: Extreme dolichoectasia. Retropharyngeal RIGHT ICA location explains difficulty of sonographic visualization. No evidence of dissection, stenosis (50% or greater) or occlusion.  Left carotid system: Extreme dolichoectasia. No evidence of dissection, stenosis (50% or greater) or occlusion.  Vertebral arteries: Codominant. No evidence of dissection, stenosis (50% or greater) or occlusion.  Skeleton: Mild spondylosis.  No osseous lesion.  Other neck: Vascular catheter RIGHT IJ approach with tips in the SVC, appears uncomplicated. Moderate soft tissue stranding over the anterior chest with a prominent pectoral lymph node. Unremarkable thyroid. Negative intracranial compartment. Dysconjugate gaze. No sinus or mastoid fluid. BILATERAL IJ patency.  IMPRESSION: No extracranial cerebral vascular disease is evident.  Extreme dolichoectasia of the carotid system, with a retropharyngeal RIGHT ICA, explains difficulty of sonographic visualization.   Electronically Signed   By: JStaci RighterM.D.   On: 12/24/2014 15:13    Labs: BMET  Recent Labs Lab 12/19/14 0541 12/20/14 0410 12/21/14 0500 12/22/14 0441 12/23/14 0636 12/24/14 0500 12/25/14 0440  NA 134* 133* 133* 137 135 135 132*  K 4.3 4.3 4.0 4.3 4.0 3.9 4.3  CL 98* 98* 98* 100* 97* 100* 96*  CO2 24 23 27 26 27 26 23   GLUCOSE 145* 165* 133* 125* 120* 91 109*  BUN 76* 85* 44* 65* 40* 52* 60*  CREATININE 5.70* 5.99* 4.16* 5.30* 3.75* 4.47* 5.03*  CALCIUM 8.0* 8.0* 7.7* 8.2* 8.4* 8.3* 8.3*  PHOS 6.6* 6.7* 4.8* 6.2* 4.5 4.5 6.6*   CBC  Recent Labs Lab 12/22/14 0725 12/23/14 0636 12/24/14 0653 12/24/14 1110  WBC 3.5* 4.5 1.2* 1.7*  NEUTROABS  --   --   --  1.2*  HGB 8.3* 9.2* 8.2* 7.5*  HCT 25.1* 27.4* 25.1* 22.6*  MCV 88.7 89.8 90.3 90.0  PLT 89* 73* 59* 56*    Medications:    . amLODipine  10 mg Oral Daily  . aspirin  81 mg Oral Daily  .  atorvastatin  20 mg Oral q1800  . calcium acetate  1,334 mg Oral TID WC  . cyclobenzaprine  10 mg Oral QHS  . [START ON 01/03/2015] darbepoetin (ARANESP) injection - DIALYSIS  100 mcg Intravenous Q Thu-HD  . famotidine  20 mg Oral Daily  . feeding supplement (PRO-STAT SUGAR FREE 64)  30 mL Oral TID BM  . folic acid  1 mg Oral Daily  . hydrALAZINE  50 mg Oral 3 times per day  . isosorbide mononitrate  30 mg Oral Daily  . metoprolol succinate  100 mg Oral Daily  . multivitamin  1 tablet Oral QHS  . predniSONE  20 mg Oral TID   CAlbin Felling MD, MPH Internal Medicine PGY-2  12/25/2014, 7:45 AM

## 2014-12-25 NOTE — Progress Notes (Signed)
      Pending patient being medically stable we will schedule thigh graft surgery for HD.  COLLINS, EMMA MAUREEN PA-C

## 2014-12-25 NOTE — Progress Notes (Signed)
Subjective: Still feels tired but right knee pain is resolved and mood feels slightly better. Patient underwent TEE for continued stroke workup today, showing valvular vegetation. 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 Antibiotics were not started. No fever, no new chills. Her heart rate has remained in the 80s-110s which was usual throughout this hospital course, without repeat of the episodically worse tachycardia to 140s yesterday. She is also to receive hemodialysis today per TTS schedule. Some morning labs which were difficult to draw remain pending and will be taken at HD today.  Objective: Vital signs in last 24 hours: Filed Vitals:   12/25/14 1401 12/25/14 1430 12/25/14 1500 12/25/14 1530  BP: 123/77 122/77 124/79 120/72  Pulse: 112 115 111 102  Temp:      TempSrc:      Resp:      Height:      Weight:      SpO2:       Weight change:   Intake/Output Summary (Last 24 hours) at 12/25/14 1600 Last data filed at 12/25/14 1326  Gross per 24 hour  Intake    320 ml  Output      0 ml  Net    320 ml   GENERAL- alert, oriented, NAD HEENT- symmetric appearing on exam, EOMI, PERRL CARDIAC- RRR, no murmurs, rubs or gallops. RESP- CTAB, no wheezes or crackles. ABDOMEN- Nontender, not distended EXTREMITIES- 2+ palpable distal pulses, trace pedal edema, dry dressings intact around R fem catheter and LUE AVG site SKIN- Warm, dry, ecchymoses around LIJ incision site NEURO- speech is clear, LUE strength is 4/5 today on exam. RUE WNL PSYCH- more engaged today, less flat affect, appropriate speech  Lab Results: Basic Metabolic Panel:  Recent Labs Lab 12/24/14 0500 12/25/14 0440  NA 135 132*  K 3.9 4.3  CL 100* 96*  CO2 26 23  GLUCOSE 91 109*  BUN 52* 60*  CREATININE 4.47* 5.03*  CALCIUM 8.3* 8.3*  PHOS 4.5 6.6*   Liver Function Tests:  Recent Labs Lab 12/24/14 1226 12/25/14 0440  AST 24  --   ALT 9*  --   ALKPHOS 53  --   BILITOT 0.6  --   PROT 4.6*  --   ALBUMIN  2.2* 2.0*   No results for input(s): LIPASE, AMYLASE in the last 168 hours. No results for input(s): AMMONIA in the last 168 hours. CBC:  Recent Labs Lab 12/24/14 0653 12/24/14 1110  WBC 1.2* 1.7*  NEUTROABS  --  1.2*  HGB 8.2* 7.5*  HCT 25.1* 22.6*  MCV 90.3 90.0  PLT 59* 56*   Cardiac Enzymes: No results for input(s): CKTOTAL, CKMB, CKMBINDEX, TROPONINI in the last 168 hours. BNP: No results for input(s): PROBNP in the last 168 hours. D-Dimer: No results for input(s): DDIMER in the last 168 hours. CBG:  Recent Labs Lab 12/21/14 1138  GLUCAP 117*   Hemoglobin A1C:  Recent Labs Lab 12/22/14 0441  HGBA1C 6.1*   Fasting Lipid Panel:  Recent Labs Lab 12/21/14 1638  CHOL 113  HDL 53  LDLCALC 39  TRIG 107  CHOLHDL 2.1   Thyroid Function Tests: No results for input(s): TSH, T4TOTAL, FREET4, T3FREE, THYROIDAB in the last 168 hours. Coagulation: No results for input(s): LABPROT, INR in the last 168 hours. Anemia Panel: No results for input(s): VITAMINB12, FOLATE, FERRITIN, TIBC, IRON, RETICCTPCT in the last 168 hours. Urine Drug Screen: Drugs of Abuse     Component Value Date/Time   LABOPIA NONE DETECTED  07/21/2012 0215   COCAINSCRNUR NONE DETECTED 07/21/2012 0215   LABBENZ NONE DETECTED 07/21/2012 0215   AMPHETMU NONE DETECTED 07/21/2012 0215   THCU NONE DETECTED 07/21/2012 0215   LABBARB NONE DETECTED 07/21/2012 0215    Alcohol Level: No results for input(s): ETH in the last 168 hours. Urinalysis: No results for input(s): COLORURINE, LABSPEC, PHURINE, GLUCOSEU, HGBUR, BILIRUBINUR, KETONESUR, PROTEINUR, UROBILINOGEN, NITRITE, LEUKOCYTESUR in the last 168 hours.  Invalid input(s): APPERANCEUR   Micro Results: Recent Results (from the past 240 hour(s))  Surgical pcr screen     Status: None   Collection Time: 12/18/14  7:56 AM  Result Value Ref Range Status   MRSA, PCR NEGATIVE NEGATIVE Final   Staphylococcus aureus NEGATIVE NEGATIVE Final     Comment:        The Xpert SA Assay (FDA approved for NASAL specimens in patients over 51 years of age), is one component of a comprehensive surveillance program.  Test performance has been validated by [facility] for patients greater than or equal to 84 year old. It is not intended to diagnose infection nor to guide or monitor treatment.   Culture, blood (routine x 2)     Status: None (Preliminary result)   Collection Time: 12/24/14 12:26 PM  Result Value Ref Range Status   Specimen Description BLOOD LEFT HAND  Final   Special Requests IN PEDIATRIC BOTTLE  [place]  Final   Culture NO GROWTH 1 DAY  Final   Report Status PENDING  Incomplete  Culture, blood (routine x 2)     Status: None (Preliminary result)   Collection Time: 12/24/14 12:40 PM  Result Value Ref Range Status   Specimen Description BLOOD RIGHT HAND  Final   Special Requests BAA 5CCS  Final   Culture NO GROWTH 1 DAY  Final   Report Status PENDING  Incomplete  Culture, blood (routine x 2)     Status: None (Preliminary result)   Collection Time: 12/25/14  1:20 PM  Result Value Ref Range Status   Specimen Description BLOOD HEMODIALYSIS CATHETER  Final   Special Requests BOTTLES DRAWN AEROBIC AND ANAEROBIC 10CC  Final   Culture PENDING  Incomplete   Report Status PENDING  Incomplete   Studies/Results: Ct Angio Neck W/cm &/or Wo/cm  12/24/2014   CLINICAL DATA:  Recent RIGHT brain infarct. Unable to adequately visualize RICA/Right carotid bifurcation on ultrasound.  EXAM: CT ANGIOGRAPHY NECK  TECHNIQUE: Multidetector CT imaging of the neck was performed using the standard protocol during bolus administration of intravenous contrast. Multiplanar CT image reconstructions and MIPs were obtained to evaluate the vascular anatomy. Carotid stenosis measurements (when applicable) are obtained utilizing NASCET criteria, using the distal internal carotid diameter as the denominator.  CONTRAST:  71mL OMNIPAQUE IOHEXOL 350 MG/ML SOLN   COMPARISON:  MR brain 12/21/2014.  FINDINGS: Aortic arch: Standard branching. Imaged portion shows no evidence of aneurysm or dissection. No significant stenosis of the major arch vessel origins.  Right carotid system: Extreme dolichoectasia. Retropharyngeal RIGHT ICA location explains difficulty of sonographic visualization. No evidence of dissection, stenosis (50% or greater) or occlusion.  Left carotid system: Extreme dolichoectasia. No evidence of dissection, stenosis (50% or greater) or occlusion.  Vertebral arteries: Codominant. No evidence of dissection, stenosis (50% or greater) or occlusion.  Skeleton: Mild spondylosis.  No osseous lesion.  Other neck: Vascular catheter RIGHT IJ approach with tips in the SVC, appears uncomplicated. Moderate soft tissue stranding over the anterior chest with a prominent pectoral lymph node. Unremarkable thyroid.  Negative intracranial compartment. Dysconjugate gaze. No sinus or mastoid fluid. BILATERAL IJ patency.  IMPRESSION: No extracranial cerebral vascular disease is evident.  Extreme dolichoectasia of the carotid system, with a retropharyngeal RIGHT ICA, explains difficulty of sonographic visualization.   Electronically Signed   By: Staci Righter M.D.   On: 12/24/2014 15:13   Medications: I have reviewed the patient's current medications. Scheduled Meds: . amLODipine  10 mg Oral Daily  . aspirin  81 mg Oral Daily  . atorvastatin  20 mg Oral q1800  . calcium acetate  1,334 mg Oral TID WC  . cyclobenzaprine  10 mg Oral QHS  . [START ON 01/03/2015] darbepoetin (ARANESP) injection - DIALYSIS  100 mcg Intravenous Q Thu-HD  . famotidine  20 mg Oral Daily  . feeding supplement (PRO-STAT SUGAR FREE 64)  30 mL Oral TID BM  . folic acid  1 mg Oral Daily  . hydrALAZINE  50 mg Oral 3 times per day  . isosorbide mononitrate  30 mg Oral Daily  . metoprolol succinate  100 mg Oral Daily  . multivitamin  1 tablet Oral QHS  . predniSONE  20 mg Oral BID   Continuous  Infusions: . sodium chloride 20 mL/hr at 12/24/14 0756   PRN Meds:.sodium chloride, sodium chloride, acetaminophen, alteplase, gelatin adsorbable, heparin, lidocaine (PF), lidocaine-prilocaine, morphine injection, ondansetron, oxyCODONE-acetaminophen, pentafluoroprop-tetrafluoroeth, sodium chloride Assessment/Plan: R MCA ant dvision M2 occlusion causing patchy Right MCA infarct Neuro exam similar to yesterday, with better effort. Vegetations on TEE today, sterile vegetations 2/2 SLE vs occult infective endocarditis. Negative blood cultures to date, no fever, no hypotension. Is chronically tachycardic and has leukopenia. Peripheral smear has suspicious appearing neutrophils. 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 No PFO noted, venous ultrasounds no longer a priority for stroke workup. 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 Starting prednisone taper, down to 40mg  today from 60mg . Pt continues TTS HD through R fem access. Permanent access still needs to be addressed preferably after recovery of platelet level - Continue prednisone 20mg  PO BID - Follow daily renal function panel - Nephrology following pt, recs appreciated - F/U vascular surgery recommendations  Pancytopenia CBC still pending today. Cellcept was discontinued yesterday for concern of bone marrow suppression. She is currently on only ASA 81mg  anticoagulation but would be indicated for proper anticoagulation if improvement in her thrombocytopenia. Peripheral smear and other studies show no evidence of TTP or hemolysis. - Supplement folic acid - Continue transfuse PRN to Hgb > 7 - Follow qAM CBCs - F/u Parvovirus B19 antibody, IgM, IgG, PCR  HTN: Controlled to 110s-140s today.  Continuing appropriate medications. - Amlodipine 10mg  PO  - Hydralazine 50mg  PO TID - Continue Imdur 30mg  -Metoprolol succinate 100mg  PO   Pain: No active complaints, minor left arm pain and tingling. - Percocet 5-325mg  q6hrs PRN - Flexeril 10mg  qHS PRN  Candidal rash: Resolved  SVT: s/p ablation 2014, has some recurrent palpitations in recent months HLD: Atorvastatin 20mg  PO Hyperkalemia: Resolved  FEN: Renal diet, fluid restricted DVT ppx: SCDs FULL CODE  Dispo: Disposition deferred until improvement in clinical condition.  The patient does have a current PCP Lin Landsman, MD) and does not need an [facility] hospital follow-up appointment after discharge.  The patient does not have transportation limitations that hinder transportation  to clinic appointments.   LOS: 22 days   Collier Salina, MD 12/25/2014, 4:00 PM

## 2014-12-25 NOTE — Progress Notes (Signed)
*  PRELIMINARY RESULTS* Echocardiogram Echocardiogram Transesophageal has been performed.  [name] 12/25/2014, 10:40 AM

## 2014-12-25 NOTE — Progress Notes (Signed)
STROKE TEAM PROGRESS NOTE   SUBJECTIVE (INTERVAL HISTORY) She was seen at HD unit. She was sleepy during rounds likely due to the sedation meds given for TEE. 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   OBJECTIVE Temp:  [97.6 F (36.4 C)-98 F (36.7 C)] 98 F (36.7 C) (09/27 1320) Pulse Rate:  [79-115] 102 (09/27 1530) Cardiac Rhythm:  [-] Normal sinus rhythm (09/27 0702) Resp:  [12-22] 18 (09/27 1320) BP: (119-160)/(56-81) 120/72 mmHg (09/27 1530) SpO2:  [87 %-100 %] 93 % (09/27 1320) Weight:  [232 lb 5.8 oz (105.4 kg)] 232 lb 5.8 oz (105.4 kg) (09/27 0533)   Recent Labs Lab 12/21/14 1138  GLUCAP 117*    Recent Labs Lab 12/21/14 0500 12/22/14 0441 12/23/14 0636 12/24/14 0500 12/25/14 0440  NA 133* 137 135 135 132*  K 4.0 4.3 4.0 3.9 4.3  CL 98* 100* 97* 100* 96*  CO2 27 26 27 26 23   GLUCOSE 133* 125* 120* 91 109*  BUN 44* 65* 40* 52* 60*  CREATININE 4.16* 5.30* 3.75* 4.47* 5.03*  CALCIUM 7.7* 8.2* 8.4* 8.3* 8.3*  PHOS 4.8* 6.2* 4.5 4.5 6.6*    Recent Labs Lab 12/22/14 0441 12/23/14 0636 12/24/14 0500 12/24/14 1226 12/25/14 0440  AST  --   --   --  24  --   ALT  --   --   --  9*  --   ALKPHOS  --   --   --  53  --   BILITOT  --   --   --  0.6  --   PROT  --   --   --  4.6*  --   ALBUMIN 2.6* 3.0* 2.5* 2.2* 2.0*    Recent Labs Lab 12/21/14 0500 12/21/14 1950 12/22/14 0725 12/23/14 0636 12/24/14 0653 12/24/14 1110  WBC 2.9*  --  3.5* 4.5 1.2* 1.7*  NEUTROABS  --   --   --   --   --  1.2*  HGB 6.7* 8.5* 8.3* 9.2* 8.2* 7.5*  HCT 20.3* 25.5* 25.1* 27.4* 25.1* 22.6*  MCV 90.6  --  88.7 89.8 90.3 90.0  PLT 72*  --  89* 73* 59* 56*   No results for input(s): CKTOTAL, CKMB, CKMBINDEX, TROPONINI in the last 168 hours. No results for input(s): LABPROT, INR in the last 72 hours. No results for input(s): COLORURINE, LABSPEC, [place], GLUCOSEU, HGBUR, BILIRUBINUR, KETONESUR, PROTEINUR, UROBILINOGEN, NITRITE, LEUKOCYTESUR  in the last 72 hours.  Invalid input(s): APPERANCEUR     Component Value Date/Time   CHOL 113 12/21/2014 1638   TRIG 107 12/21/2014 1638   HDL 53 12/21/2014 1638   CHOLHDL 2.1 12/21/2014 1638   VLDL 21 12/21/2014 1638   LDLCALC 39 12/21/2014 1638   Lab Results  Component Value Date   HGBA1C 6.1* 12/22/2014      Component Value Date/Time   LABOPIA NONE DETECTED 07/21/2012 0215   COCAINSCRNUR NONE DETECTED 07/21/2012 0215   LABBENZ NONE DETECTED 07/21/2012 0215   AMPHETMU NONE DETECTED 07/21/2012 0215   THCU NONE DETECTED 07/21/2012 0215   LABBARB NONE DETECTED 07/21/2012 0215    No results for input(s): ETH in the last 168 hours.  I have personally reviewed the radiological images below and agree with the radiology interpretations.  Ct Head Wo Contrast  12/21/2014    IMPRESSION: Normal head CT.    Mri and Mra Head Wo Contrast  12/21/2014    IMPRESSION: 1. Right MCA anterior division M2  branch occlusion with associated patchy right MCA infarct. No hemorrhage or mass effect. 2. Otherwise negative intracranial MRI and MRA.   2D echo - - Compared to the prior echo in 2014, there is now severe LV wall thickening. The EF is higher at 65-70%. A trivial pericardial effusion persists.  CUS - Study was technically limited due to patient anatomy, depth of vessels, and patient body habitus. Findings suggest 1-39% left internal carotid artery stenosis. Unable to adequately evaluate the right internal carotid artery, therefore cannot exclude stenosis. Vertebral arteries are patent with antegrade flow.  LE and UE venous doppler pending  CT Angio Neck W/cm &/or Wo/cm  12/24/2014   IMPRESSION: No extracranial cerebral vascular disease is evident.  Extreme dolichoectasia of the carotid system, with a retropharyngeal RIGHT ICA, explains difficulty of sonographic visualization.   TEE Normal LV size and function Normal RV size and function Mildly dilated RA Mild to moderately dilated  LA Normal TV with mild TR Normal PV Calcified MV annulus. There is shaggy mobile density that emenates off the lateral aspect of the posterior mitral valve leaflet near the annulus measuring 0.7 x 0.5 cm in diameter. This is consistent with vegetation. 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 Normal trileaflet AV with trivial AR Normal interatrial septum with no evidence of shunt by colorflow dopper or agitated saline contrast injection.  Normal thoracic and ascending aorta.   PHYSICAL EXAM  Temp:  [97.6 F (36.4 C)-98 F (36.7 C)] 98 F (36.7 C) (09/27 1320) Pulse Rate:  [79-115] 102 (09/27 1530) Resp:  [12-22] 18 (09/27 1320) BP: (119-160)/(56-81) 120/72 mmHg (09/27 1530) SpO2:  [87 %-100 %] 93 % (09/27 1320) Weight:  [232 lb 5.8 oz (105.4 kg)] 232 lb 5.8 oz (105.4 kg) (09/27 0533)  General - morbid obesity, full moon face, well developed, sleepy drowsy in HD unit undergoing HD  ophthalmologic - fundi not visualized due to eye movement.  Cardiovascular - Regular rate and rhythm.  Mental Status -  Drowsy sleepy but orientated to time, place, and person were intact. Language including expression, naming, repetition, comprehension was assessed and found intact. Fund of Knowledge was assessed and was impaired.  Cranial Nerves II - XII - II - Visual field intact OU. III, IV, VI - Extraocular movements intact. V - Facial sensation intact bilaterally. VII - left mild lower facial droop. VIII - Hearing & vestibular intact bilaterally. X - Palate elevates symmetrically. XI - Chin turning & shoulder shrug intact bilaterally. XII - Tongue protrusion intact.  Motor Strength - The patient's strength was 5/5 RUE, 3+/5 LUE proximally but 4+/5 distally, 4/5 BLE.  Bulk was normal and fasciculations were absent.   Motor Tone - Muscle tone was assessed at the neck and appendages and was normal.  Reflexes - The patient's reflexes were symmetrical in all  extremities and she had no pathological reflexes.  Sensory - Light touch, temperature/pinprick were assessed and were symmetrical.    Coordination - The patient had normal movements in the hands with no ataxia or dysmetria.  Tremor was absent.  Gait and Station - not tested due to safety concerns.   ASSESSMENT/PLAN Ms. Aleksis Jiggetts is a 39 y.o. female with history of SVT s/p ablation, SLE nephritis, ESRD on HD admitted for worsening renal function. She had prolonged hospital course for AVF, AVG, HD as well as anemia, thrombocytopenia, bleeding from surgical sites, AVF steal and then ligation. She also suffer stroke on 12/21/14. MRI showed right MCA stroke  with right M2 cut off. Symptoms much improved. Stroke work up underway.    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 Antiphospholipid antibodies pending. 30 day cardiac event monitoring result pending too.  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  Hypercoagulable and autoimmune work up pending  30 day cardiac event monitoring result pending from Dr. Irven Shelling office  LDL 39  HgbA1c 6.1  SCDs for VTE prophylaxis  Diet regular Room service appropriate?: Yes; Fluid consistency:: Thin   no antithrombotic prior to admission, now on aspirin 81 mg orally every day. Ideally, pt should be put on anticoagulation for MV vegetation causing embolic stroke, however, due to pt pancytopenia requiring blood and platelet transfusion, she is not currently candidate for anticoagulation. Agree to continue baby ASA. Once pt becomes candidate, anticoagulation  should be considered at that time.  Patient counseled to be compliant with her antithrombotic medications  Ongoing aggressive stroke risk factor management  Therapy recommendations:  pending  Disposition:  Pending  Noninfectious endocarditis  TEE confirmed MV vegetation  Pt has hx of SLE and SLE nephritis  Consistent with libman sacks endocarditis  However, pt not candidate for anticoagulation now  continue baby ASA  Once pt becomes candidate, may consider anticoagulation at that time  Pancytopenia  Agree with hematology consult  Close monitoring  May hold off antiplatelet if platelet < 50 to avoid risk of bleeding  ESRD on HD  Nephrology on board  VVS working on the access  Hypertension  Home meds:   Norvasc, metoprolol BP goal gradual normolization  Currently on metoprolol  Patient counseled to be compliant with her blood pressure medications  Hyperlipidemia  Home meds:  lipitor 20   Currently on lipitor 20  LDL 36, goal < 70  Continue statin at discharge  Other Stroke Risk Factors  Obesity, Body mass index is 41.17 kg/(m^2).   ? OSA  Other Active Problems  Anemia  thrombocytopenia  Other Pertinent History  Pending AVG  Hospital day # 22  Rosalin Hawking, MD PhD Stroke Neurology 12/25/2014 3:40 PM    To contact Stroke Continuity provider, please refer to [email or web address] After hours, contact General Neurology

## 2014-12-25 NOTE — Interval H&P Note (Signed)
History and Physical Interval Note:  12/25/2014 9:35 AM  [name]  has presented today for surgery, with the diagnosis of STROKE  The various methods of treatment have been discussed with the patient and family. After consideration of risks, benefits and other options for treatment, the patient has consented to  Procedure(s): TRANSESOPHAGEAL ECHOCARDIOGRAM (TEE) (N/A) as a surgical intervention .  The patient's history has been reviewed, patient examined, no change in status, stable for surgery.  I have reviewed the patient's chart and labs.  Questions were answered to the patient's satisfaction.     Nikolis Berent R

## 2014-12-26 ENCOUNTER — Inpatient Hospital Stay (HOSPITAL_COMMUNITY): Payer: Medicaid Other

## 2014-12-26 DIAGNOSIS — M3211 Endocarditis in systemic lupus erythematosus: Secondary | ICD-10-CM

## 2014-12-26 DIAGNOSIS — I639 Cerebral infarction, unspecified: Secondary | ICD-10-CM

## 2014-12-26 LAB — CBC WITH DIFFERENTIAL/PLATELET
BASOS ABS: 0 10*3/uL (ref 0.0–0.1)
BASOS PCT: 0 %
Eosinophils Absolute: 0 10*3/uL (ref 0.0–0.7)
Eosinophils Relative: 0 %
HEMATOCRIT: 18.8 % — AB (ref 36.0–46.0)
HEMOGLOBIN: 6.2 g/dL — AB (ref 12.0–15.0)
LYMPHS PCT: 5 %
Lymphs Abs: 0.1 10*3/uL — ABNORMAL LOW (ref 0.7–4.0)
MCH: 29.7 pg (ref 26.0–34.0)
MCHC: 33 g/dL (ref 30.0–36.0)
MCV: 90 fL (ref 78.0–100.0)
Monocytes Absolute: 0.1 10*3/uL (ref 0.1–1.0)
Monocytes Relative: 6 %
NEUTROS ABS: 1.7 10*3/uL (ref 1.7–7.7)
NEUTROS PCT: 89 %
Platelets: 73 10*3/uL — ABNORMAL LOW (ref 150–400)
RBC: 2.09 MIL/uL — AB (ref 3.87–5.11)
RDW: 14.5 % (ref 11.5–15.5)
WBC: 1.9 10*3/uL — ABNORMAL LOW (ref 4.0–10.5)

## 2014-12-26 LAB — PARVOVIRUS B19 ANTIBODY, IGG AND IGM
Parovirus B19 IgG Abs: 0.2 index (ref 0.0–0.8)
Parovirus B19 IgM Abs: 0.1 index (ref 0.0–0.8)

## 2014-12-26 LAB — COMPREHENSIVE METABOLIC PANEL
ALBUMIN: 2 g/dL — AB (ref 3.5–5.0)
ALT: 9 U/L — ABNORMAL LOW (ref 14–54)
ANION GAP: 10 (ref 5–15)
AST: 23 U/L (ref 15–41)
Alkaline Phosphatase: 53 U/L (ref 38–126)
BILIRUBIN TOTAL: 0.6 mg/dL (ref 0.3–1.2)
BUN: 39 mg/dL — ABNORMAL HIGH (ref 6–20)
CO2: 26 mmol/L (ref 22–32)
Calcium: 7.7 mg/dL — ABNORMAL LOW (ref 8.9–10.3)
Chloride: 97 mmol/L — ABNORMAL LOW (ref 101–111)
Creatinine, Ser: 3.71 mg/dL — ABNORMAL HIGH (ref 0.44–1.00)
GFR calc non Af Amer: 14 mL/min — ABNORMAL LOW (ref >60)
GFR, EST AFRICAN AMERICAN: 17 mL/min — AB (ref >60)
GLUCOSE: 175 mg/dL — AB (ref 65–99)
POTASSIUM: 3.6 mmol/L (ref 3.5–5.1)
SODIUM: 133 mmol/L — AB (ref 135–145)
TOTAL PROTEIN: 5.1 g/dL — AB (ref 6.5–8.1)

## 2014-12-26 LAB — HEMOGLOBIN AND HEMATOCRIT, BLOOD
HEMATOCRIT: 19 % — AB (ref 36.0–46.0)
HEMOGLOBIN: 6.3 g/dL — AB (ref 12.0–15.0)

## 2014-12-26 LAB — PREPARE RBC (CROSSMATCH)

## 2014-12-26 MED ORDER — HYDROXYCHLOROQUINE SULFATE 200 MG PO TABS
200.0000 mg | ORAL_TABLET | Freq: Every day | ORAL | Status: DC
  Administered 2014-12-26 – 2015-01-02 (×8): 200 mg via ORAL
  Filled 2014-12-26 (×9): qty 1

## 2014-12-26 MED ORDER — HYDROXYCHLOROQUINE SULFATE 200 MG PO TABS: 200.0000 mg | ORAL_TABLET | Freq: Every day | ORAL | Status: DC

## 2014-12-26 MED ORDER — ASPIRIN EC 81 MG PO TBEC
81.0000 mg | DELAYED_RELEASE_TABLET | Freq: Every day | ORAL | Status: DC
  Administered 2014-12-26 – 2015-01-05 (×11): 81 mg via ORAL
  Filled 2014-12-26 (×11): qty 1

## 2014-12-26 MED ORDER — PANTOPRAZOLE SODIUM 20 MG PO TBEC
20.0000 mg | DELAYED_RELEASE_TABLET | Freq: Every day | ORAL | Status: DC
  Administered 2014-12-26 – 2015-01-05 (×11): 20 mg via ORAL
  Filled 2014-12-26 (×12): qty 1

## 2014-12-26 MED ORDER — WARFARIN SODIUM 4 MG PO TABS
4.0000 mg | ORAL_TABLET | Freq: Every day | ORAL | Status: DC
  Administered 2014-12-26 – 2014-12-29 (×4): 4 mg via ORAL
  Filled 2014-12-26 (×6): qty 1

## 2014-12-26 MED ORDER — SODIUM CHLORIDE 0.9 % IV SOLN
Freq: Once | INTRAVENOUS | Status: AC
  Administered 2015-01-04: 10:00:00 via INTRAVENOUS

## 2014-12-26 MED ORDER — WARFARIN - PHYSICIAN DOSING INPATIENT
Freq: Every day | Status: DC
  Administered 2014-12-26: 18:00:00

## 2014-12-26 NOTE — Consult Note (Addendum)
[place] wound consult note Pt familiar to [facility] team from previous consult on 9/23.  Requested to re-assess bilat buttock wounds; they have decreased slightly in size since previous consult. Wound type: 2 areas of stage 2 pressure injuries Pressure Ulcer POA: No Measurement: Left buttock 2.5X1X.1cm, there are 2 separate areas within this location which are now separated by a thin strip of skin Right buttock 3.5X3X.1cm Wound bed: wounds are pink and moist, mod amt yellow drainage, no odor Periwound: Intact skin surrounding Dressing procedure/placement/frequency: Continue present plan of care with foam dressings to protect and promote healing. Discussed plan of care with patient and she denies further questions. Air mattress has been ordered for pressure reduction. Please re-consult if further assistance is needed. Thank-you,  Julien Girt MSN, [place], [place], [place], [place]

## 2014-12-26 NOTE — Progress Notes (Signed)
PT Cancellation Note  Patient Details Name: [name] MRN: [id number] DOB: 09-22-1975   Cancelled Treatment:    Reason Eval/Treat Not Completed: Other (comment).  Pt with femoral HD access and is on bedrest.  PT will be unable to work with this pt until access is moved as there is risk of kinking or breaking the access.  First contact pager notified.    Thanks,    Barbarann Ehlers. [place], [place], DPT [phone]   12/26/2014, 4:26 PM

## 2014-12-26 NOTE — Progress Notes (Signed)
  Date: 12/26/2014  Patient name: [name]  Medical record number: [id number]  Date of birth: 1975-08-03   [name] plan of care was discussed with the house staff. Please see Dr. Marveen Reeks note for complete details. I concur with their findings. Briefly, [name] has had uncontrolled lupus nephritis while on high dose cell-cept and steroids, now requiring HD for ESRD. She sustained a R-MCA stroke on sept 23rd with left facial and arm weakness. TEE on 9/27 showed she has a mobile density that emenates off the lateral aspect of the posterior mitral valve leaflet near the annulus measuring 0.7 x 0.5 cm in diameter. She is currently on aspirin for anti-platelet therapy.  1. SLE : currently on prednisone 40mg  daily. Will consult with rheumatology to add hydroxyurea to decrease anti-inflammatory effects, extra-renal manifestation of SLE  2. MV endocarditis with R MCA infarct = lesion thought to be c/w libman-sacks due to hx of SLE. Infectious endocarditis being rule out with 3 blood cx, culture negative work up. Currently on vancomycin for the next 72hr til we finalize blood cultures. For scenarios of L-S endocarditis, will need to collectively decide, with consultants, if best to add warfarin with goal INR of 2-3.  3. Stroke sequelae and decondition = will have patient work with PT today for arm weakness and overall deconditioning  4. Pancytopenia = her plt appears slightly improved but 2 other cell lines still decreased. Thought to be de to cellcept will continue to monitor. For now.  More details to plan are listed in Dr. Marveen Reeks note.   Carlyle Basques, MD 12/26/2014, 12:37 PM

## 2014-12-26 NOTE — Progress Notes (Signed)
Patient ID: [name], female   DOB: 02-08-1976, 39 y.o.   MRN: [id number]  [facility] Progress Note   Assessment/ Plan:   1. 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 Currently on TThS schedule, has outpatient HD spot. Placement of left thigh graft postponed given recent stroke. HD yesterday. Next HD tomorrow. Prednisone decreased to 20 mg BID. Will continue this dose today and then taper down to 20 mg daily tomorrow.  2. 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 Patient with residual left-sided extremity and facial weakness. Currently on baby aspirin and atorvastatin. Lupus anticoagulant pending. TEE showed a vegetation on the mitral valve and likely to be source of emboli. Primary team working up for infectious endocarditis vs marantic endocarditis. Blood cultures negative so far. Currently on Vancomycin empirically. Curious if Plaquenil could play a role as per Dr. Synthia Innocent consult note? Will look into. Once infectious endocarditis can be ruled out then question becomes anticoagulation. Dr. Darnell Level seems to favor ASA/Plaquenil. Agree with Dr. Darnell Level on getting rheumatology consult. I have had a good experience with Dr. Amil Amen in past and he would likely be willing to have case run by him.  3. Hypertension: BP in 212Y-482N systolic. Continue Toprol-XL, Norvasc, and Hydralazine.  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 PTH level 71. Will need to have PTH monitoring, check in 3 months.  5. Pancytopenia: Platelets 59,000>67,000. Hgb 7.6 and WBC 3.5 yesterday. Awaiting labs from today. Dr. Beryle Beams (hematology) consulted and does not believe there is evidence of TTP. Suspects that her pancytopenia is likely due to bone marrow suppression from CellCept vs occult infection vs  immune mediated secondary to lupus. CellCept discontinued on 9/26. Prednisone being tapered. Continue Aranesp. Patient found to have a vegetation on her mitral valve on TEE yesterday. Primary team working up for possible infectious endocarditis vs marantic endocarditis.    Subjective:   No acute events overnight. Went over results of TEE again with patient and she seems to understand what is going on. She is concerned about what next steps will be. I ran into her mother in the hallway and she is concerned because she feels patient's speech is abnormal and that the patient is saying strange things. For instance, the patient had called a friend and asked her if she could bring a sweater because it's going to be cold this weekend and planning a girls weekend when patient is unable to walk.    Objective:   BP 136/63 mmHg  Pulse 77  Temp(Src) 98.4 F (36.9 C) (Oral)  Resp 15  Ht 5\' 3"  (1.6 m)  Wt 226 lb 3.1 oz (102.6 kg)  BMI 40.08 kg/m2  SpO2 98%  LMP 11/17/2014 (Approximate)  Intake/Output Summary (Last 24 hours) at 12/26/14 0721 Last data filed at 12/26/14 0434  Gross per 24 hour  Intake    430 ml  Output   2500 ml  Net  -2070 ml   Weight change: -7 lb 0.9 oz (-3.2 kg)  Physical Exam: Gen: alert, resting in bed, pleasant, NAD  CVS: RRR, no m/g/r Resp: CTA bilaterally, breaths non-labored Abd: BS+, soft, obese, non-tender Ext: trace LE edema. Right femoral tunneled dialysis catheter without bleeding Neuro: alert and oriented x 4, 3/5 strength in LUE, 4/5 LLE, 5/5 in RUE and RLE. Slight left facial droop present.  Imaging: Ct Angio Neck W/cm &/or Wo/cm  12/24/2014   CLINICAL DATA:  Recent RIGHT brain infarct. Unable to adequately visualize RICA/Right carotid bifurcation on ultrasound.  EXAM: CT ANGIOGRAPHY NECK  TECHNIQUE: Multidetector CT imaging of the neck was performed using the standard protocol during bolus administration of intravenous contrast. Multiplanar CT image  reconstructions and MIPs were obtained to evaluate the vascular anatomy. Carotid stenosis measurements (when applicable) are obtained utilizing NASCET criteria, using the distal internal carotid diameter as the denominator.  CONTRAST:  9mL OMNIPAQUE IOHEXOL 350 MG/ML SOLN  COMPARISON:  MR brain 12/21/2014.  FINDINGS: Aortic arch: Standard branching. Imaged portion shows no evidence of aneurysm or dissection. No significant stenosis of the major arch vessel origins.  Right carotid system: Extreme dolichoectasia. Retropharyngeal RIGHT ICA location explains difficulty of sonographic visualization. No evidence of dissection, stenosis (50% or greater) or occlusion.  Left carotid system: Extreme dolichoectasia. No evidence of dissection, stenosis (50% or greater) or occlusion.  Vertebral arteries: Codominant. No evidence of dissection, stenosis (50% or greater) or occlusion.  Skeleton: Mild spondylosis.  No osseous lesion.  Other neck: Vascular catheter RIGHT IJ approach with tips in the SVC, appears uncomplicated. Moderate soft tissue stranding over the anterior chest with a prominent pectoral lymph node. Unremarkable thyroid. Negative intracranial compartment. Dysconjugate gaze. No sinus or mastoid fluid. BILATERAL IJ patency.  IMPRESSION: No extracranial cerebral vascular disease is evident.  Extreme dolichoectasia of the carotid system, with a retropharyngeal RIGHT ICA, explains difficulty of sonographic visualization.   Electronically Signed   By: Staci Righter M.D.   On: 12/24/2014 15:13    Labs: BMET  Recent Labs Lab 12/20/14 0410 12/21/14 0500 12/22/14 0441 12/23/14 0636 12/24/14 0500 12/25/14 0440 12/26/14 0430  NA 133* 133* 137 135 135 132* 133*  K 4.3 4.0 4.3 4.0 3.9 4.3 3.6  CL 98* 98* 100* 97* 100* 96* 97*  CO2 23 27 26 27 26 23 26   GLUCOSE 165* 133* 125* 120* 91 109* 175*  BUN 85* 44* 65* 40* 52* 60* 39*  CREATININE 5.99* 4.16* 5.30* 3.75* 4.47* 5.03* 3.71*  CALCIUM 8.0* 7.7* 8.2* 8.4*  8.3* 8.3* 7.7*  PHOS 6.7* 4.8* 6.2* 4.5 4.5 6.6*  --    CBC  Recent Labs Lab 12/23/14 0636 12/24/14 0653 12/24/14 1110 12/25/14 1530 12/25/14 1846  WBC 4.5 1.2* 1.7* 3.5*  --   NEUTROABS  --   --  1.2*  --  2.5  HGB 9.2* 8.2* 7.5* 7.6*  --   HCT 27.4* 25.1* 22.6* 22.2*  --   MCV 89.8 90.3 90.0 88.4  --   PLT 73* 59* 56* 67*  --     Medications:    . amLODipine  10 mg Oral Daily  . aspirin  81 mg Oral Daily  . atorvastatin  20 mg Oral q1800  . calcium acetate  1,334 mg Oral TID WC  . cyclobenzaprine  10 mg Oral QHS  . [START ON 01/03/2015] darbepoetin (ARANESP) injection - DIALYSIS  100 mcg Intravenous Q Thu-HD  . famotidine  20 mg Oral Daily  . feeding supplement (PRO-STAT SUGAR FREE 64)  30 mL Oral TID BM  . folic acid  1 mg Oral Daily  . hydrALAZINE  50 mg Oral 3 times per day  . isosorbide mononitrate  30 mg Oral Daily  . metoprolol succinate  100 mg Oral Daily  . multivitamin  1 tablet Oral QHS  . predniSONE  20 mg Oral BID   [name], MD,  [facility] Internal Medicine PGY-2  12/26/2014, 7:21 AM

## 2014-12-26 NOTE — Progress Notes (Signed)
Patient ID: [name], female   DOB: May 29, 1975, 39 y.o.   MRN: [id number] Hematology addendum: Email consult with Hematology colleague @ [facility]. E mail consult with Cardiovascular Surgery. Indications for surgery also unclear. Discussion with Medicine attending, residents, Neurology, & Nephrology consultants. Rheumatology opinion pending. Additional literature search unrevealing as to optimal management.  I have decided to cautiously begin warfarin with target INR 2-2.5 starting with 4 mg daily. In the balance, risk of recurrent stroke higher than bleeding risk.  Continue low dose ASA. Transfuse to stable Hb.    Murriel Hopper, MD, [place]  Hematology-Oncology/Internal Medicine

## 2014-12-26 NOTE — Progress Notes (Signed)
VASCULAR LAB PRELIMINARY  PRELIMINARY  PRELIMINARY  PRELIMINARY  Bilateral lower extremity venous duplex and Bilateral upper extremity venous duplex completed.    Preliminary report:   Lower extremity:  Bilateral:  No evidence of DVT, superficial thrombosis, or Baker's Cyst.   Upper extremity:  Bilateral:  No evidence of DVT or superficial thrombosis.  Cestone,Helene, RVT 12/26/2014, 3:41 PM

## 2014-12-26 NOTE — Progress Notes (Signed)
Patient ID: Brynne Doane, female   DOB: 11-07-1975, 39 y.o.   MRN: [id number] Hematology Echo findings of a vegetation on mitral valve noted. In context of poorly controlled lupus and in the absence of fever/heart murmur,findings consistent with Martie Lee endocarditis although I agree with covering with antibiotics until cultures are mature. Anticardiolipin antibodies and anti beta-2-glycoprotein-1 antibodies negative. Lupus anticoagulant result pending. Clinical still high suspicion we are dealing with antiphospholipid antibody syndrome or variant thereof. Blood counts 9/27 improving coincident with stopping Cellcept. Today's CBC pending. Recommendation: There are no evidence based guidelines or contemporary studies to guide optimal anticoagulation in this setting. Older literature suggests warfarin at a high INR: this has not been validated. Other studies suggest that heparin products are superior but this data is also flawed since only studied in cancer patients. Since pathophysiology of vegetations in LSE appears to be endotheial damage/cell proliferation from immune complex deposition, aggressive treatment of the primary disease is most appropriate. Addition of Plaquenil which has anti-inflammatory and immunomodulatory effects may be appropriate in this regard. It is unclear whether warfarin or Aspirin is the best anticoagulant. I would favor ASA/Plaquenil over warfarin. If consensus of other consultants is to use warfarin, I would use a standard target INR of 2-3 and continue 81 mg of ASA.  I would get a Rheumatology consult.    Murriel Hopper, MD, [place]  Hematology-Oncology/Internal Medicine [phone]

## 2014-12-26 NOTE — Progress Notes (Signed)
CRITICAL VALUE ALERT  Critical value received:  Hgb 6.2  Date of notification:  12/26/14  Time of notification:  1834  Critical value read back:Yes.    Nurse who received alert:  Zenon Mayo, rn  MD notified (1st page):  Dr. Benjamine Mola  Time of first page:  0935  Responding MD:  Dr. Benjamine Mola  Time MD responded:  [phone]

## 2014-12-26 NOTE — Progress Notes (Signed)
STROKE TEAM PROGRESS NOTE   SUBJECTIVE (INTERVAL HISTORY) The patient's mother was at the bedside. The patient feels she is getting better. 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 Lab consistent with SLE. So far antiphospholipid antibody negative. Hematology consulted.  OBJECTIVE Temp:  [98.1 F (36.7 C)-98.6 F (37 C)] 98.3 F (36.8 C) (09/28 1450) Pulse Rate:  [77-111] 102 (09/28 1450) Cardiac Rhythm:  [-] Normal sinus rhythm (09/28 0707) Resp:  [15-24] 22 (09/28 1450) BP: (114-152)/(61-82) 152/82 mmHg (09/28 1450) SpO2:  [93 %-100 %] 99 % (09/28 1450) Weight:  [102.2 kg (225 lb 5 oz)-102.6 kg (226 lb 3.1 oz)] 102.6 kg (226 lb 3.1 oz) (09/28 0547)   Recent Labs Lab 12/21/14 1138  GLUCAP 117*    Recent Labs Lab 12/21/14 0500 12/22/14 0441 12/23/14 0636 12/24/14 0500 12/25/14 0440 12/26/14 0430  NA 133* 137 135 135 132* 133*  K 4.0 4.3 4.0 3.9 4.3 3.6  CL 98* 100* 97* 100* 96* 97*  CO2 27 26 27 26 23 26   GLUCOSE 133* 125* 120* 91 109* 175*  BUN 44* 65* 40* 52* 60* 39*  CREATININE 4.16* 5.30* 3.75* 4.47* 5.03* 3.71*  CALCIUM 7.7* 8.2* 8.4* 8.3* 8.3* 7.7*  PHOS 4.8* 6.2* 4.5 4.5 6.6*  --     Recent Labs Lab 12/23/14 0636 12/24/14 0500 12/24/14 1226 12/25/14 0440 12/26/14 0430  AST  --   --  24  --  23  ALT  --   --  9*  --  9*  ALKPHOS  --   --  53  --  53  BILITOT  --   --  0.6  --  0.6  PROT  --   --  4.6*  --  5.1*  ALBUMIN 3.0* 2.5* 2.2* 2.0* 2.0*    Recent Labs Lab 12/23/14 0636 12/24/14 0653 12/24/14 1110 12/25/14 1530 12/25/14 1846 12/26/14 0845 12/26/14 1055  WBC 4.5 1.2* 1.7* 3.5*  --  1.9*  --   NEUTROABS  --   --  1.2*  --  2.5 1.7  --   HGB 9.2* 8.2* 7.5* 7.6*  --  6.2* 6.3*  HCT 27.4* 25.1* 22.6* 22.2*  --  18.8* 19.0*  MCV 89.8 90.3 90.0 88.4  --  90.0  --   PLT 73* 59* 56* 67*  --  73*  --    No results for input(s): CKTOTAL, CKMB, CKMBINDEX, TROPONINI in the last 168 hours. No results for input(s): LABPROT, INR in the last  72 hours. No results for input(s): COLORURINE, LABSPEC, [place], GLUCOSEU, HGBUR, BILIRUBINUR, KETONESUR, PROTEINUR, UROBILINOGEN, NITRITE, LEUKOCYTESUR in the last 72 hours.  Invalid input(s): APPERANCEUR     Component Value Date/Time   CHOL 113 12/21/2014 1638   TRIG 107 12/21/2014 1638   HDL 53 12/21/2014 1638   CHOLHDL 2.1 12/21/2014 1638   VLDL 21 12/21/2014 1638   LDLCALC 39 12/21/2014 1638   Lab Results  Component Value Date   HGBA1C 6.1* 12/22/2014      Component Value Date/Time   LABOPIA NONE DETECTED 07/21/2012 0215   COCAINSCRNUR NONE DETECTED 07/21/2012 0215   LABBENZ NONE DETECTED 07/21/2012 0215   AMPHETMU NONE DETECTED 07/21/2012 0215   THCU NONE DETECTED 07/21/2012 0215   LABBARB NONE DETECTED 07/21/2012 0215    No results for input(s): ETH in the last 168 hours.  I have personally reviewed the radiological images below and agree with the radiology interpretations.  Ct Head Wo Contrast  12/21/2014    IMPRESSION: Normal  head CT.    Mri and Mra Head Wo Contrast  12/21/2014    IMPRESSION: 1. 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 No hemorrhage or mass effect. 2. Otherwise negative intracranial MRI and MRA.   2D echo - - Compared to the prior echo in 2014, there is now severe LV wall thickening. The EF is higher at 65-70%. A trivial pericardial effusion persists.  CUS - Study was technically limited due to patient anatomy, depth of vessels, and patient body habitus. Findings suggest 1-39% left internal carotid artery stenosis. Unable to adequately evaluate the right internal carotid artery, therefore cannot exclude stenosis. Vertebral arteries are patent with antegrade flow.  LE and UE venous doppler negative for DVTs  CT Angio Neck W/cm &/or Wo/cm  12/24/2014   IMPRESSION: No extracranial cerebral vascular disease is evident.  Extreme dolichoectasia of the carotid system, with a retropharyngeal RIGHT  ICA, explains difficulty of sonographic visualization.   TEE Normal LV size and function Normal RV size and function Mildly dilated RA Mild to moderately dilated LA Normal TV with mild TR Normal PV Calcified MV annulus. There is shaggy mobile density that emenates off the lateral aspect of the posterior mitral valve leaflet near the annulus measuring 0.7 x 0.5 cm in diameter. This is consistent with vegetation. 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 Normal trileaflet AV with trivial AR Normal interatrial septum with no evidence of shunt by colorflow dopper or agitated saline contrast injection.  Normal thoracic and ascending aorta.   Component     Latest Ref Rng 12/24/2014  Beta-2 Glyco I IgG     0 - 20 GPI IgG units <9  Beta-2-Glycoprotein I IgM     0 - 32 GPI IgM units <9  Beta-2-Glycoprotein I IgA     0 - 25 GPI IgA units <9  Anticardiolipin IgG     0 - 14 GPL U/mL <9  Anticardiolipin IgM     0 - 12 MPL U/mL <9  Anticardiolipin IgA     0 - 11 APL U/mL <9  Homocysteine     0.0 - 15.0 umol/L 29.7 (H)  CRP     <1.0 mg/dL 3.7 (H)  Sed Rate     0 - 22 mm/hr 44 (H)  ds DNA Ab     0 - 9 IU/mL 18 (H)  ENA SM Ab Ser-aCnc     0.0 - 0.9 AI >8.0 (H)     PHYSICAL EXAM  Temp:  [98.1 F (36.7 C)-98.6 F (37 C)] 98.3 F (36.8 C) (09/28 1450) Pulse Rate:  [77-111] 102 (09/28 1450) Resp:  [15-24] 22 (09/28 1450) BP: (114-152)/(61-82) 152/82 mmHg (09/28 1450) SpO2:  [93 %-100 %] 99 % (09/28 1450) Weight:  [102.2 kg (225 lb 5 oz)-102.6 kg (226 lb 3.1 oz)] 102.6 kg (226 lb 3.1 oz) (09/28 0547)  General - morbid obesity, full moon face, well developed, sleepy drowsy in HD unit undergoing HD  ophthalmologic - fundi not visualized due to eye movement.  Cardiovascular - Regular rate and rhythm.  Mental Status -  Drowsy sleepy but orientated to time, place, and person were intact. Language including expression, naming, repetition,  comprehension was assessed and found intact. Fund of Knowledge was assessed and was impaired.  Cranial Nerves II - XII - II - Visual field intact OU. III, IV, VI - Extraocular movements intact. V - Facial sensation intact bilaterally. VII - left mild lower facial  droop. VIII - Hearing & vestibular intact bilaterally. X - Palate elevates symmetrically. XI - Chin turning & shoulder shrug intact bilaterally. XII - Tongue protrusion intact.  Motor Strength - The patient's strength was 5/5 RUE, 3+/5 LUE proximally but 4+/5 distally, 4/5 BLE.  Bulk was normal and fasciculations were absent.   Motor Tone - Muscle tone was assessed at the neck and appendages and was normal.  Reflexes - The patient's reflexes were symmetrical in all extremities and she had no pathological reflexes.  Sensory - Light touch, temperature/pinprick were assessed and were symmetrical.    Coordination - The patient had normal movements in the hands with no ataxia or dysmetria.  Tremor was absent.  Gait and Station - not tested due to safety concerns.   ASSESSMENT/PLAN [name] is a 39 y.o. female with history of SVT s/p ablation, SLE nephritis, ESRD on HD admitted for worsening renal function. She had prolonged hospital course for AVF, AVG, HD as well as anemia, thrombocytopenia, bleeding from surgical sites, AVF steal and then ligation. She also suffer stroke on 12/21/14. MRI showed right MCA stroke with right M2 cut off. Symptoms much improved. Stroke work up underway.    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   MRI  Right MCA infarct  MRA  Right M2 cut off  Carotid Doppler  Left ICA 1-39%, right ICA not able to see well due to technical difficulties  2D Echo  - LVH EF 65-70%  CTA neck unremarkable, right ICA dissection ruled out  LE and UE venous doppler - negative for DVTs  TEE showed MV  vegetation consistent with noninfectious endocarditis.  Hypercoagulable and autoimmune work up negative for phospholipid antibodies but consistent with SLE  30 day cardiac event monitoring result pending from Dr. Irven Shelling office  LDL 39  HgbA1c 6.1  SCDs for VTE prophylaxis  Diet regular Room service appropriate?: Yes; Fluid consistency:: Thin   no antithrombotic prior to admission, now on aspirin 81 mg orally every day. Discussed with hematology Dr. Beryle Beams and we agree with anticoagulation with coumadin. Once INR 2-2.5 and her ASA can be discontinued.   Currently day 5 post stroke. Hemorrhagic transformation risk will be small with expectation of INR on target in the next 2-3 days.   Patient counseled to be compliant with her antithrombotic medications  Ongoing aggressive stroke risk factor management  Noninfectious endocarditis  TEE confirmed MV vegetation  Pt has hx of SLE and SLE nephritis  Consistent with libman sacks endocarditis  Discussed with hematology Dr. Beryle Beams and we agree with anticoagulation with coumadin. Once INR 2-2.5 and her ASA can be discontinued.  SLE   dsDNA and anti-smith antibodies positive  SLE nephritis  Libman-sacks endocarditis  On cellcept and prednisone  Rheumatology consult pending  Pancytopenia  Hematology on board  Close monitoring CBC  May hold off antiplatelet if platelet < 50 to avoid risk of bleeding  ESRD on HD  Nephrology on board  VVS working on the access  Hypertension  Home meds:   Norvasc, metoprolol BP goal gradual normolization  Currently on metoprolol  Patient counseled to be compliant with her blood pressure medications  Hyperlipidemia  Home meds:  lipitor 20   Currently on lipitor 20  LDL 36, goal < 70  Continue statin at discharge  Other Stroke Risk Factors  Obesity, Body mass index is 40.08 kg/(m^2).   possible OSA  Other Active Problems  Anemia  thrombocytopenia  Other  Pertinent History  Pending AVG  Hospital day # 23  Neurology will sign off. Please call with questions. Pt will follow up with Dr. Erlinda Hong at [facility] in about 2 months. Thanks for the consult.  Rosalin Hawking, MD PhD Stroke Neurology 12/26/2014 4:30 PM    To contact Stroke Continuity provider, please refer to [email or web address] After hours, contact General Neurology

## 2014-12-26 NOTE — Progress Notes (Signed)
Subjective: Patient mood and energy better today, not complaining of any further pain in her legs. No bleeding from any sites, tolerated dialysis yesterday without incident. Her neurological deficit is consistent with yesterday, but she is much more energetic and engaged in conversation. Ordered change to air mattress for low-grade pressure ulcers, improved from last week, due to prolonged bed rest. Patient continues to be pancytopenic and was transfused 1 unit packed red blood cells today. Case was discussed by phone conversation with Dr. Amil Amen of [facility] Rheumatology as well as the previously consulted neurology, hematology, and nephrology services. Review of available data decided to start patient on anticoagulation with Coumadin. She was still unable to participate with physical therapy due to her right femoral HD catheter location.  Objective: Vital signs in last 24 hours: Filed Vitals:   12/26/14 0900 12/26/14 1203 12/26/14 1414 12/26/14 1450  BP: 136/62 123/67 143/75 152/82  Pulse: 85 96 106 102  Temp: 98.4 F (36.9 C) 98.4 F (36.9 C) 98.4 F (36.9 C) 98.3 F (36.8 C)  TempSrc: Oral Oral Oral Oral  Resp:   24 22  Height:      Weight:      SpO2: 100% 100% 97% 99%   Weight change: -3.2 kg (-7 lb 0.9 oz)  Intake/Output Summary (Last 24 hours) at 12/26/14 1654 Last data filed at 12/26/14 1435  Gross per 24 hour  Intake    382 ml  Output   2500 ml  Net  -2118 ml   GENERAL- alert, oriented, NAD HEENT- symmetric appearing on exam, EOMI, PERRL CARDIAC- RRR, no murmurs, rubs or gallops. RESP- CTAB, no wheezes or crackles. ABDOMEN- Nontender, not distended BACK- bilateral low stage sacral pressure ulcers with dressing in place, clean dry and intact EXTREMITIES- 2+ palpable distal pulses, trace pedal edema, dry dressings intact around R fem catheter and LUE AVG site SKIN- Warm, dry, ecchymoses around LIJ incision site NEURO- speech is clear, LUE strength is 4/5 today on  exam. RUE WNL PSYCH- appropriate mood and affect, appropriate speech  Lab Results: Basic Metabolic Panel:  Recent Labs Lab 12/24/14 0500 12/25/14 0440 12/26/14 0430  NA 135 132* 133*  K 3.9 4.3 3.6  CL 100* 96* 97*  CO2 26 23 26   GLUCOSE 91 109* 175*  BUN 52* 60* 39*  CREATININE 4.47* 5.03* 3.71*  CALCIUM 8.3* 8.3* 7.7*  PHOS 4.5 6.6*  --    Liver Function Tests:  Recent Labs Lab 12/24/14 1226 12/25/14 0440 12/26/14 0430  AST 24  --  23  ALT 9*  --  9*  ALKPHOS 53  --  53  BILITOT 0.6  --  0.6  PROT 4.6*  --  5.1*  ALBUMIN 2.2* 2.0* 2.0*   No results for input(s): LIPASE, AMYLASE in the last 168 hours. No results for input(s): AMMONIA in the last 168 hours. CBC:  Recent Labs Lab 12/25/14 1530 12/25/14 1846 12/26/14 0845 12/26/14 1055  WBC 3.5*  --  1.9*  --   NEUTROABS  --  2.5 1.7  --   HGB 7.6*  --  6.2* 6.3*  HCT 22.2*  --  18.8* 19.0*  MCV 88.4  --  90.0  --   PLT 67*  --  73*  --    Cardiac Enzymes: No results for input(s): CKTOTAL, CKMB, CKMBINDEX, TROPONINI in the last 168 hours. BNP: No results for input(s): PROBNP in the last 168 hours. D-Dimer: No results for input(s): DDIMER in the last 168 hours. CBG:  Recent Labs Lab 12/21/14 1138  GLUCAP 117*   Hemoglobin A1C:  Recent Labs Lab 12/22/14 0441  HGBA1C 6.1*   Fasting Lipid Panel:  Recent Labs Lab 12/21/14 1638  CHOL 113  HDL 53  LDLCALC 39  TRIG 107  CHOLHDL 2.1   Thyroid Function Tests: No results for input(s): TSH, T4TOTAL, FREET4, T3FREE, THYROIDAB in the last 168 hours. Coagulation: No results for input(s): LABPROT, INR in the last 168 hours. Anemia Panel: No results for input(s): VITAMINB12, FOLATE, FERRITIN, TIBC, IRON, RETICCTPCT in the last 168 hours. Urine Drug Screen: Drugs of Abuse     Component Value Date/Time   LABOPIA NONE DETECTED 07/21/2012 0215   COCAINSCRNUR NONE DETECTED 07/21/2012 0215   LABBENZ NONE DETECTED 07/21/2012 0215   AMPHETMU NONE  DETECTED 07/21/2012 0215   THCU NONE DETECTED 07/21/2012 0215   LABBARB NONE DETECTED 07/21/2012 0215    Alcohol Level: No results for input(s): ETH in the last 168 hours. Urinalysis: No results for input(s): COLORURINE, LABSPEC, PHURINE, GLUCOSEU, HGBUR, BILIRUBINUR, KETONESUR, PROTEINUR, UROBILINOGEN, NITRITE, LEUKOCYTESUR in the last 168 hours.  Invalid input(s): APPERANCEUR   Micro Results: Recent Results (from the past 240 hour(s))  Surgical pcr screen     Status: None   Collection Time: 12/18/14  7:56 AM  Result Value Ref Range Status   MRSA, PCR NEGATIVE NEGATIVE Final   Staphylococcus aureus NEGATIVE NEGATIVE Final    Comment:        The Xpert SA Assay (FDA approved for NASAL specimens in patients over 20 years of age), is one component of a comprehensive surveillance program.  Test performance has been validated by [facility] for patients greater than or equal to 74 year old. It is not intended to diagnose infection nor to guide or monitor treatment.   Culture, blood (routine x 2)     Status: None (Preliminary result)   Collection Time: 12/24/14 12:26 PM  Result Value Ref Range Status   Specimen Description BLOOD LEFT HAND  Final   Special Requests IN PEDIATRIC BOTTLE  1CC  Final   Culture NO GROWTH 2 DAYS  Final   Report Status PENDING  Incomplete  Culture, blood (routine x 2)     Status: None (Preliminary result)   Collection Time: 12/24/14 12:40 PM  Result Value Ref Range Status   Specimen Description BLOOD RIGHT HAND  Final   Special Requests BAA 5CCS  Final   Culture NO GROWTH 2 DAYS  Final   Report Status PENDING  Incomplete  Culture, blood (routine x 2)     Status: None (Preliminary result)   Collection Time: 12/25/14  1:20 PM  Result Value Ref Range Status   Specimen Description BLOOD HEMODIALYSIS CATHETER  Final   Special Requests BOTTLES DRAWN AEROBIC AND ANAEROBIC 10CC  Final   Culture NO GROWTH < 24 HOURS  Final   Report Status PENDING   Incomplete  Culture, blood (routine x 2)     Status: None (Preliminary result)   Collection Time: 12/25/14  6:59 PM  Result Value Ref Range Status   Specimen Description BLOOD BLOOD RIGHT FOREARM  Final   Special Requests IN PEDIATRIC BOTTLE 1CC  Final   Culture NO GROWTH < 24 HOURS  Final   Report Status PENDING  Incomplete   Studies/Results: No results found. Medications: I have reviewed the patient's current medications. Scheduled Meds: . sodium chloride   Intravenous Once  . amLODipine  10 mg Oral Daily  . aspirin EC  81  mg Oral Daily  . atorvastatin  20 mg Oral q1800  . calcium acetate  1,334 mg Oral TID WC  . cyclobenzaprine  10 mg Oral QHS  . [START ON 01/03/2015] darbepoetin (ARANESP) injection - DIALYSIS  100 mcg Intravenous Q Thu-HD  . feeding supplement (PRO-STAT SUGAR FREE 64)  30 mL Oral TID BM  . folic acid  1 mg Oral Daily  . hydrALAZINE  50 mg Oral 3 times per day  . isosorbide mononitrate  30 mg Oral Daily  . metoprolol succinate  100 mg Oral Daily  . multivitamin  1 tablet Oral QHS  . pantoprazole  20 mg Oral Daily  . predniSONE  20 mg Oral BID   Continuous Infusions: . 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 Continuing to follow up blood cultures and studies for seronegative infective endocarditis. In the meantime we'll continue to treat for verrucous vegetations. Full review of the current clinical picture supports either noninfectious endocarditis versus an occult or fastidious infectious endocarditis. We'll continue to treat empirically during this interval. Review of current guidelines offers little hard evidence for management of noninfectious endocarditis.  After discussion of case with hematology and  neurology consultants plan is to start Coumadin anticoagulation at a slow rate. Continue low-dose aspirin at least until induction. She will be at least 7-8 days out from a stroke before reaching therapeutic INR. We'll monitor closely for recurrence of her previous bleeding with start of anticoagulation. 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 Pt continues TTS HD through R fem access. Permanent vascular access remains to be decided as patient is still not clinically stable for surgery. 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 Patient transfused 1 unit pRBCs today for hemoglobin of 6.2. Leukocytosis of 1.9, platelets 73,000. Platelets trending upward, other cell lines unimproved. - Supplement folic acid - Continue transfuse PRN to Hgb > 7 - Follow qAM CBCs - F/u Parvovirus B19 antibody, IgM, IgG, PCR  HTN: Controlled to 110s-150s today. Continuing appropriate medications. - Amlodipine 10mg  PO  - Hydralazine 50mg  PO TID - Continue Imdur 30mg  -Metoprolol succinate 100mg  PO   Pain: No active complaints - Percocet 5-325mg  q6hrs PRN - Flexeril 10mg  qHS PRN  Stage 2 sacral pressure ulcer Hospital-acquired due to prolonged bed rest and patient's obesity, fragile skin due to steroids and SLE. - Wound care evaluated, current dressings and treatment appropriate - Changed to air mattress for reduced pressure  Candidal rash: Resolved  SVT: s/p ablation 2014 HLD: Atorvastatin 20mg  PO Hyperkalemia: Resolved  FEN: Renal diet, fluid restricted DVT ppx: SCDs FULL CODE  Dispo: Disposition deferred until  improvement in clinical condition.  The patient does have a current PCP Lin Landsman, MD) and does not need an [facility] hospital follow-up appointment after discharge.  The patient does not have transportation limitations that hinder transportation to clinic appointments.   LOS: 23 days   Collier Salina, MD 12/26/2014, 4:54 PM

## 2014-12-27 ENCOUNTER — Encounter (HOSPITAL_COMMUNITY): Payer: Self-pay | Admitting: Cardiology

## 2014-12-27 DIAGNOSIS — I339 Acute and subacute endocarditis, unspecified: Secondary | ICD-10-CM

## 2014-12-27 DIAGNOSIS — L89152 Pressure ulcer of sacral region, stage 2: Secondary | ICD-10-CM

## 2014-12-27 LAB — RENAL FUNCTION PANEL
Albumin: 2.2 g/dL — ABNORMAL LOW (ref 3.5–5.0)
Anion gap: 10 (ref 5–15)
BUN: 61 mg/dL — ABNORMAL HIGH (ref 6–20)
CALCIUM: 8.2 mg/dL — AB (ref 8.9–10.3)
CHLORIDE: 101 mmol/L (ref 101–111)
CO2: 26 mmol/L (ref 22–32)
CREATININE: 4.71 mg/dL — AB (ref 0.44–1.00)
GFR calc Af Amer: 13 mL/min — ABNORMAL LOW (ref >60)
GFR calc non Af Amer: 11 mL/min — ABNORMAL LOW (ref >60)
GLUCOSE: 136 mg/dL — AB (ref 65–99)
Phosphorus: 3.8 mg/dL (ref 2.5–4.6)
Potassium: 3.8 mmol/L (ref 3.5–5.1)
SODIUM: 137 mmol/L (ref 135–145)

## 2014-12-27 LAB — BARTONELLA ANITBODY PANEL
B QUINTANA IGG: NEGATIVE {titer}
B QUINTANA IGM: NEGATIVE {titer}

## 2014-12-27 LAB — CBC
HCT: 25.7 % — ABNORMAL LOW (ref 36.0–46.0)
HEMOGLOBIN: 8.5 g/dL — AB (ref 12.0–15.0)
MCH: 29.7 pg (ref 26.0–34.0)
MCHC: 33.1 g/dL (ref 30.0–36.0)
MCV: 89.9 fL (ref 78.0–100.0)
Platelets: 88 10*3/uL — ABNORMAL LOW (ref 150–400)
RBC: 2.86 MIL/uL — AB (ref 3.87–5.11)
RDW: 15 % (ref 11.5–15.5)
WBC: 1.8 10*3/uL — ABNORMAL LOW (ref 4.0–10.5)

## 2014-12-27 LAB — PROTEIN S ACTIVITY: Protein S Activity: 82 % (ref 60–145)

## 2014-12-27 LAB — PROTHROMBIN GENE MUTATION

## 2014-12-27 LAB — LUPUS ANTICOAGULANT PANEL
DRVVT: 39.1 s (ref 0.0–55.1)
PTT LA: 32.1 s (ref 0.0–50.0)

## 2014-12-27 LAB — PROTEIN S, TOTAL: Protein S Ag, Total: 106 % (ref 58–150)

## 2014-12-27 LAB — BARTONELLA ANTIBODY PANEL
B henselae IgG: NEGATIVE titer
B henselae IgM: NEGATIVE titer

## 2014-12-27 LAB — PROTEIN C ACTIVITY: Protein C Activity: 179 % — ABNORMAL HIGH (ref 74–151)

## 2014-12-27 LAB — PROTIME-INR
INR: 1 (ref 0.00–1.49)
PROTHROMBIN TIME: 13.4 s (ref 11.6–15.2)

## 2014-12-27 LAB — HUMAN PARVOVIRUS DNA DETECTION BY PCR: Parvovirus B19, PCR: NEGATIVE

## 2014-12-27 MED ORDER — PREDNISONE 20 MG PO TABS
20.0000 mg | ORAL_TABLET | Freq: Every day | ORAL | Status: AC
Start: 2014-12-28 — End: 2014-12-29
  Administered 2014-12-28 – 2014-12-29 (×2): 20 mg via ORAL
  Filled 2014-12-27 (×2): qty 1

## 2014-12-27 MED ORDER — PATIENT'S GUIDE TO USING COUMADIN BOOK
Freq: Once | Status: AC
  Administered 2014-12-27: 15:00:00
  Filled 2014-12-27: qty 1

## 2014-12-27 MED ORDER — WARFARIN VIDEO
Freq: Once | Status: AC
  Administered 2014-12-27: 15:00:00

## 2014-12-27 MED ORDER — VANCOMYCIN HCL IN DEXTROSE 1-5 GM/200ML-% IV SOLN
1000.0000 mg | INTRAVENOUS | Status: DC
  Filled 2014-12-27: qty 200

## 2014-12-27 NOTE — Progress Notes (Signed)
PT Cancellation/Sign off Note  Patient Details Name: [name] MRN: [id number] DOB: [birth year]   Cancelled Treatment:    Reason Eval/Treat Not Completed: Other (comment).  Pt still has HD catheter in her femoral vein.  PT will sign off.  Please re-order [place] once HD access is moved.   Thanks, Barbarann Ehlers. [place], [place], DPT [phone]      12/27/2014, 2:52 PM

## 2014-12-27 NOTE — Progress Notes (Addendum)
ANTIBIOTIC CONSULT NOTE - INITIAL  Pharmacy Consult for Vancomycin Indication: endocarditis  Allergies  Allergen Reactions  . Food Swelling    Red peppers   Labs:  Recent Labs  12/25/14 0440  12/25/14 1530 12/26/14 0430 12/26/14 0845 12/26/14 1055 12/27/14 0413  WBC  --   --  3.5*  --  1.9*  --  1.8*  HGB  --   < > 7.6*  --  6.2* 6.3* 8.5*  PLT  --   --  67*  --  73*  --  88*  CREATININE 5.03*  --   --  3.71*  --   --  4.71*  < > = values in this interval not displayed. Estimated Creatinine Clearance: 18.2 mL/min (by C-G formula based on Cr of 4.71). No results for input(s): VANCOTROUGH, VANCOPEAK, VANCORANDOM, GENTTROUGH, GENTPEAK, GENTRANDOM, TOBRATROUGH, TOBRAPEAK, TOBRARND, AMIKACINPEAK, AMIKACINTROU, AMIKACIN in the last 72 hours.    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  Vancomycin continues for endocarditis. Valvular vegetation revealed on TEE today.  HD Thursday --> rescheduled  Goal of Therapy:  Vancomycin trough level 15-25 mcg/ml  Plan:  Vancomycin 1 gram iv x 1 on Friday Measure antibiotic drug levels at steady state Follow up culture results, HD plans and clinical course  Thank you Anette Guarneri, PharmD [phone] 12/27/2014,1:16 PM

## 2014-12-27 NOTE — Progress Notes (Signed)
Subjective: Patient feeling well today. Will start working with OT on upper extremity strength and coordination today, PT deferred due to HD catheter. No evidence of bleeding. Blood counts improving today. Patient reports she was told probably hemodialysis tomorrow, although was previously on TTS schedule. Tapering steroids, starting plaquenil, warfarin, now on air mattress.  Objective: Vital signs in last 24 hours: Filed Vitals:   12/26/14 1738 12/26/14 2123 12/27/14 0500 12/27/14 0549  BP: 134/72 142/73  159/86  Pulse: 96 94  83  Temp: 98.4 F (36.9 C) 98.7 F (37.1 C)  98.6 F (37 C)  TempSrc: Oral Oral  Oral  Resp: 18 16  20   Height:      Weight:   100.245 kg (221 lb) 99.338 kg (219 lb)  SpO2: 99% 98%  100%   Weight change: -1.955 kg (-4 lb 5 oz)  Intake/Output Summary (Last 24 hours) at 12/27/14 1323 Last data filed at 12/27/14 1321  Gross per 24 hour  Intake   1060 ml  Output      0 ml  Net   1060 ml   GENERAL- alert, oriented, NAD HEENT- symmetric appearing on exam, EOMI, PERRL CARDIAC- RRR, no murmurs, rubs or gallops. RESP- CTAB, no wheezes or crackles. EXTREMITIES- 2+ palpable distal pulses, trace pedal edema, dry dressings intact around R fem catheter and LUE AVG site SKIN- Warm, dry, ecchymoses around LIJ incision site NEURO- speech is clear, LUE strength is 4/5 today on exam. RUE WNL PSYCH- appropriate mood and affect, appropriate speech  Lab Results: Basic Metabolic Panel:  Recent Labs Lab 12/25/14 0440 12/26/14 0430 12/27/14 0413  NA 132* 133* 137  K 4.3 3.6 3.8  CL 96* 97* 101  CO2 23 26 26   GLUCOSE 109* 175* 136*  BUN 60* 39* 61*  CREATININE 5.03* 3.71* 4.71*  CALCIUM 8.3* 7.7* 8.2*  PHOS 6.6*  --  3.8   Liver Function Tests:  Recent Labs Lab 12/24/14 1226  12/26/14 0430 12/27/14 0413  AST 24  --  23  --   ALT 9*  --  9*  --   ALKPHOS 53  --  53  --   BILITOT 0.6  --  0.6  --   PROT 4.6*  --  5.1*  --   ALBUMIN 2.2*  < > 2.0*  2.2*  < > = values in this interval not displayed. No results for input(s): LIPASE, AMYLASE in the last 168 hours. No results for input(s): AMMONIA in the last 168 hours. CBC:  Recent Labs Lab 12/25/14 1846 12/26/14 0845 12/26/14 1055 12/27/14 0413  WBC  --  1.9*  --  1.8*  NEUTROABS 2.5 1.7  --   --   HGB  --  6.2* 6.3* 8.5*  HCT  --  18.8* 19.0* 25.7*  MCV  --  90.0  --  89.9  PLT  --  73*  --  88*   Cardiac Enzymes: No results for input(s): CKTOTAL, CKMB, CKMBINDEX, TROPONINI in the last 168 hours. BNP: No results for input(s): PROBNP in the last 168 hours. D-Dimer: No results for input(s): DDIMER in the last 168 hours. CBG:  Recent Labs Lab 12/21/14 1138  GLUCAP 117*   Hemoglobin A1C:  Recent Labs Lab 12/22/14 0441  HGBA1C 6.1*   Fasting Lipid Panel:  Recent Labs Lab 12/21/14 1638  CHOL 113  HDL 53  LDLCALC 39  TRIG 107  CHOLHDL 2.1   Thyroid Function Tests: No results for input(s): TSH, T4TOTAL, FREET4,  T3FREE, THYROIDAB in the last 168 hours. Coagulation:  Recent Labs Lab 12/27/14 0413  LABPROT 13.4  INR 1.00   Anemia Panel: No results for input(s): VITAMINB12, FOLATE, FERRITIN, TIBC, IRON, RETICCTPCT in the last 168 hours. Urine Drug Screen: Drugs of Abuse     Component Value Date/Time   LABOPIA NONE DETECTED 07/21/2012 0215   COCAINSCRNUR NONE DETECTED 07/21/2012 0215   LABBENZ NONE DETECTED 07/21/2012 0215   AMPHETMU NONE DETECTED 07/21/2012 0215   THCU NONE DETECTED 07/21/2012 0215   LABBARB NONE DETECTED 07/21/2012 0215    Alcohol Level: No results for input(s): ETH in the last 168 hours. Urinalysis: No results for input(s): COLORURINE, LABSPEC, PHURINE, GLUCOSEU, HGBUR, BILIRUBINUR, KETONESUR, PROTEINUR, UROBILINOGEN, NITRITE, LEUKOCYTESUR in the last 168 hours.  Invalid input(s): APPERANCEUR   Micro Results: Recent Results (from the past 240 hour(s))  Surgical pcr screen     Status: None   Collection Time: 12/18/14  7:56  AM  Result Value Ref Range Status   MRSA, PCR NEGATIVE NEGATIVE Final   Staphylococcus aureus NEGATIVE NEGATIVE Final    Comment:        The Xpert SA Assay (FDA approved for NASAL specimens in patients over 34 years of age), is one component of a comprehensive surveillance program.  Test performance has been validated by [facility] for patients greater than or equal to 57 year old. It is not intended to diagnose infection nor to guide or monitor treatment.   Culture, blood (routine x 2)     Status: None (Preliminary result)   Collection Time: 12/24/14 12:26 PM  Result Value Ref Range Status   Specimen Description BLOOD LEFT HAND  Final   Special Requests IN PEDIATRIC BOTTLE  1CC  Final   Culture NO GROWTH 2 DAYS  Final   Report Status PENDING  Incomplete  Culture, blood (routine x 2)     Status: None (Preliminary result)   Collection Time: 12/24/14 12:40 PM  Result Value Ref Range Status   Specimen Description BLOOD RIGHT HAND  Final   Special Requests BAA 5CCS  Final   Culture NO GROWTH 2 DAYS  Final   Report Status PENDING  Incomplete  Culture, blood (routine x 2)     Status: None (Preliminary result)   Collection Time: 12/25/14  1:20 PM  Result Value Ref Range Status   Specimen Description BLOOD HEMODIALYSIS CATHETER  Final   Special Requests BOTTLES DRAWN AEROBIC AND ANAEROBIC 10CC  Final   Culture NO GROWTH < 24 HOURS  Final   Report Status PENDING  Incomplete  Culture, blood (routine x 2)     Status: None (Preliminary result)   Collection Time: 12/25/14  6:59 PM  Result Value Ref Range Status   Specimen Description BLOOD BLOOD RIGHT FOREARM  Final   Special Requests IN PEDIATRIC BOTTLE 1CC  Final   Culture NO GROWTH < 24 HOURS  Final   Report Status PENDING  Incomplete   Studies/Results: No results found. Medications: I have reviewed the patient's current medications. Scheduled Meds: . sodium chloride   Intravenous Once  . amLODipine  10 mg Oral Daily  .  aspirin EC  81 mg Oral Daily  . atorvastatin  20 mg Oral q1800  . calcium acetate  1,334 mg Oral TID WC  . cyclobenzaprine  10 mg Oral QHS  . [START ON 01/03/2015] darbepoetin (ARANESP) injection - DIALYSIS  100 mcg Intravenous Q Thu-HD  . feeding supplement (PRO-STAT SUGAR FREE 64)  30 mL  Oral TID BM  . folic acid  1 mg Oral Daily  . hydrALAZINE  50 mg Oral 3 times per day  . hydroxychloroquine  200 mg Oral Daily  . isosorbide mononitrate  30 mg Oral Daily  . metoprolol succinate  100 mg Oral Daily  . multivitamin  1 tablet Oral QHS  . pantoprazole  20 mg Oral Daily  . [START ON 12/28/2014] predniSONE  20 mg Oral Q breakfast  . vancomycin  1,000 mg Intravenous Q T,Th,Sa-HD  . warfarin  4 mg Oral q1800  . Warfarin - Physician Dosing Inpatient   Does not apply q1800   Continuous Infusions: . 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 Day 2 of warfarin. Will start checking INR at appropriate timing and goal is 2.0-2.5. - Start warfarin 4 mg daily - ASA 81mg  - Continue to F/U lupus anticoagulant  Endocarditis Continuing to follow up blood cultures and studies for seronegative infective endocarditis. No acute indications for other interventions at this time. Starting patient on plaquenil as this is likely related to her SLE and needs continued management. 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 Pt continues TTS HD through R fem access, maybe not until Friday though. Permanent vascular access remains to be decided as patient was not clinically stable for surgery. 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 Platelets increased to 88,000. WBCs unimproved at 1.7. - Supplement folic acid - Continue transfuse PRN to Hgb > 7 - Follow qAM CBCs - F/u Parvovirus B19 antibody, IgM, IgG, PCR  HTN: Controlled to 120s-150s today. Continuing appropriate medications. - Amlodipine 10mg  PO  - Hydralazine 50mg  PO TID - Continue Imdur 30mg  -Metoprolol succinate 100mg  PO   Pain: No active complaints - Percocet 5-325mg  q6hrs PRN - Flexeril 10mg  qHS PRN  Stage 2 sacral pressure ulcer Now on air mattress. Wounds are being appropriately dressed and no concerning findings of warmth, erythema, skin breakdown. - Wound care evaluated, current dressings and treatment appropriate  Candidal rash: Resolved  SVT: s/p ablation 2014 HLD: Atorvastatin 20mg  PO Hyperkalemia: Resolved  FEN: Renal diet, fluid restricted DVT ppx: SCDs FULL CODE  Dispo: Disposition deferred until improvement in clinical condition.  The patient does have a current PCP Lin Landsman, MD) and does not need an [facility] hospital follow-up appointment after discharge.  The patient does not have transportation limitations that hinder transportation to clinic appointments.   LOS: 24 days   Collier Salina, MD 12/27/2014, 1:23 PM

## 2014-12-27 NOTE — Progress Notes (Signed)
Patient ID: [name], female   DOB: 10/06/1975, 39 y.o.   MRN: [id number]  [facility] Progress Note   Assessment/ Plan:   1. 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 Currently on TThS schedule, has outpatient HD spot. Placement of left thigh graft postponed given recent stroke. HD today. Will decrease Prednisone to 20 mg daily.  2. 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 Patient with residual left-sided extremity and facial weakness. Currently on baby aspirin and atorvastatin. Lupus anticoagulant pending. TEE showed a vegetation on the mitral valve and likely to be source of emboli. Primary team working up for infectious endocarditis vs marantic endocarditis. Leaning towards marantic. Blood cultures negative so far. Currently on Vancomycin empirically. After primary team consulted with rheumatology, have decided to start patient on coumadin. Once INR 2.0-2.5 can discontinue ASA per neurology.  3. Hypertension: BP in 277A-128N systolic. Continue Toprol-XL, Norvasc, and Hydralazine.  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 PTH level 71. Will need to have PTH monitoring, check in 3 months.  5. Pancytopenia: Platelets coming up steadily, 88,000 from 73,0000. Hgb dropped yesterday to 6.3 and patient received 1 unit PRBCs. Repeat Hgb this AM 8.5. Still leukopenic with WBCs 1.8 (1.9 yesterday). Suspect pancytopenia from bone marrow suppression from Cellcept, although infectious endocarditis not completely ruled out yet. Continue Aranesp.    Subjective:   No acute events overnight. Patient states she is feeling well, but seems to have depressed mood. She did not eat dinner last night.     Objective:   BP 159/86 mmHg  Pulse 83  Temp(Src) 98.6 F (37 C) (Oral)   Resp 20  Ht 5\' 3"  (1.6 m)  Wt 221 lb (100.245 kg)  BMI 39.16 kg/m2  SpO2 100%  LMP 11/17/2014 (Approximate)  Intake/Output Summary (Last 24 hours) at 12/27/14 0724 Last data filed at 12/27/14 0409  Gross per 24 hour  Intake    542 ml  Output      0 ml  Net    542 ml   Weight change: -4 lb 5 oz (-1.955 kg)  Physical Exam: Gen: alert, resting in bed, pleasant, NAD  CVS: RRR, no m/g/r Resp: CTA bilaterally, breaths non-labored Abd: BS+, soft, obese, non-tender Ext: No LE edema. Right femoral tunneled dialysis catheter without bleeding Neuro: alert and oriented x 4, 3/5 strength in LUE, 4/5 LLE, 5/5 in RUE and RLE. Slight left facial droop present.   Imaging: No results found.  Labs: BMET  Recent Labs Lab 12/21/14 0500 12/22/14 0441 12/23/14 0636 12/24/14 0500 12/25/14 0440 12/26/14 0430 12/27/14 0413  NA 133* 137 135 135 132* 133* 137  K 4.0 4.3 4.0 3.9 4.3 3.6 3.8  CL 98* 100* 97* 100* 96* 97* 101  CO2 27 26 27 26 23 26 26   GLUCOSE 133* 125* 120* 91 109* 175* 136*  BUN 44* 65* 40* 52* 60* 39* 61*  CREATININE 4.16* 5.30* 3.75* 4.47* 5.03* 3.71* 4.71*  CALCIUM 7.7* 8.2* 8.4* 8.3* 8.3* 7.7* 8.2*  PHOS 4.8* 6.2* 4.5 4.5 6.6*  --  3.8   CBC  Recent Labs Lab 12/24/14 1110 12/25/14 1530 12/25/14 1846 12/26/14 0845 12/26/14 1055 12/27/14 0413  WBC 1.7* 3.5*  --  1.9*  --  1.8*  NEUTROABS 1.2*  --  2.5 1.7  --   --  HGB 7.5* 7.6*  --  6.2* 6.3* 8.5*  HCT 22.6* 22.2*  --  18.8* 19.0* 25.7*  MCV 90.0 88.4  --  90.0  --  89.9  PLT 56* 67*  --  73*  --  88*    Medications:    . sodium chloride   Intravenous Once  . amLODipine  10 mg Oral Daily  . aspirin EC  81 mg Oral Daily  . atorvastatin  20 mg Oral q1800  . calcium acetate  1,334 mg Oral TID WC  . cyclobenzaprine  10 mg Oral QHS  . [START ON 01/03/2015] darbepoetin (ARANESP) injection - DIALYSIS  100 mcg Intravenous Q Thu-HD  . feeding supplement (PRO-STAT SUGAR FREE 64)  30 mL Oral TID BM  . folic  acid  1 mg Oral Daily  . hydrALAZINE  50 mg Oral 3 times per day  . hydroxychloroquine  200 mg Oral Daily  . isosorbide mononitrate  30 mg Oral Daily  . metoprolol succinate  100 mg Oral Daily  . multivitamin  1 tablet Oral QHS  . pantoprazole  20 mg Oral Daily  . predniSONE  20 mg Oral BID  . warfarin  4 mg Oral q1800  . Warfarin - Physician Dosing Inpatient   Does not apply Q2229   [name], MD, MPH Internal Medicine PGY-2  12/27/2014, 7:24 AM

## 2014-12-28 DIAGNOSIS — I38 Endocarditis, valve unspecified: Secondary | ICD-10-CM

## 2014-12-28 DIAGNOSIS — R52 Pain, unspecified: Secondary | ICD-10-CM

## 2014-12-28 DIAGNOSIS — Z8673 Personal history of transient ischemic attack (TIA), and cerebral infarction without residual deficits: Secondary | ICD-10-CM

## 2014-12-28 LAB — DIFFERENTIAL
BASOS ABS: 0 10*3/uL (ref 0.0–0.1)
Basophils Relative: 0 %
EOS ABS: 0 10*3/uL (ref 0.0–0.7)
Eosinophils Relative: 3 %
LYMPHS ABS: 0.4 10*3/uL — AB (ref 0.7–4.0)
Lymphocytes Relative: 31 %
Monocytes Absolute: 0.1 10*3/uL (ref 0.1–1.0)
Monocytes Relative: 7 %
NEUTROS PCT: 59 %
Neutro Abs: 0.8 10*3/uL — ABNORMAL LOW (ref 1.7–7.7)

## 2014-12-28 LAB — CBC
HEMATOCRIT: 21.5 % — AB (ref 36.0–46.0)
HEMATOCRIT: 27.9 % — AB (ref 36.0–46.0)
HEMOGLOBIN: 7 g/dL — AB (ref 12.0–15.0)
HEMOGLOBIN: 9 g/dL — AB (ref 12.0–15.0)
MCH: 29.5 pg (ref 26.0–34.0)
MCH: 29.5 pg (ref 26.0–34.0)
MCHC: 32.6 g/dL (ref 30.0–36.0)
MCHC: 32.3 g/dL (ref 30.0–36.0)
MCV: 90.7 fL (ref 78.0–100.0)
MCV: 91.5 fL (ref 78.0–100.0)
Platelets: 99 10*3/uL — ABNORMAL LOW (ref 150–400)
Platelets: 94 10*3/uL — ABNORMAL LOW (ref 150–400)
RBC: 2.37 MIL/uL — ABNORMAL LOW (ref 3.87–5.11)
RBC: 3.05 MIL/uL — ABNORMAL LOW (ref 3.87–5.11)
RDW: 15.2 % (ref 11.5–15.5)
RDW: 15.2 % (ref 11.5–15.5)
WBC: 1.3 10*3/uL — CL (ref 4.0–10.5)
WBC: 1.7 10*3/uL — ABNORMAL LOW (ref 4.0–10.5)

## 2014-12-28 LAB — RENAL FUNCTION PANEL
ALBUMIN: 2.3 g/dL — AB (ref 3.5–5.0)
ANION GAP: 9 (ref 5–15)
BUN: 75 mg/dL — AB (ref 6–20)
CALCIUM: 8.1 mg/dL — AB (ref 8.9–10.3)
CO2: 26 mmol/L (ref 22–32)
Chloride: 103 mmol/L (ref 101–111)
Creatinine, Ser: 5.4 mg/dL — ABNORMAL HIGH (ref 0.44–1.00)
GFR calc Af Amer: 11 mL/min — ABNORMAL LOW (ref >60)
GFR calc non Af Amer: 9 mL/min — ABNORMAL LOW (ref >60)
GLUCOSE: 89 mg/dL (ref 65–99)
PHOSPHORUS: 3.6 mg/dL (ref 2.5–4.6)
POTASSIUM: 3.7 mmol/L (ref 3.5–5.1)
SODIUM: 138 mmol/L (ref 135–145)

## 2014-12-28 LAB — PROTIME-INR
INR: 1.04 (ref 0.00–1.49)
Prothrombin Time: 13.8 seconds (ref 11.6–15.2)

## 2014-12-28 LAB — PREPARE RBC (CROSSMATCH)

## 2014-12-28 MED ORDER — PREDNISONE 5 MG PO TABS: 5.0000 mg | ORAL_TABLET | Freq: Every day | ORAL | Status: AC

## 2014-12-28 MED ORDER — PENTAFLUOROPROP-TETRAFLUOROETH EX AERO
1.0000 | INHALATION_SPRAY | CUTANEOUS | Status: DC | PRN
Start: 2014-12-28 — End: 2014-12-28

## 2014-12-28 MED ORDER — VANCOMYCIN HCL IN DEXTROSE 1-5 GM/200ML-% IV SOLN
1000.0000 mg | INTRAVENOUS | Status: DC
  Filled 2014-12-28: qty 200

## 2014-12-28 MED ORDER — ALTEPLASE 2 MG IJ SOLR: 2.0000 mg | Freq: Once | INTRAMUSCULAR | Status: DC | PRN

## 2014-12-28 MED ORDER — HEPARIN SODIUM (PORCINE) 1000 UNIT/ML DIALYSIS: 1000.0000 [IU] | INTRAMUSCULAR | Status: DC | PRN

## 2014-12-28 MED ORDER — VANCOMYCIN HCL IN DEXTROSE 1-5 GM/200ML-% IV SOLN
1000.0000 mg | Freq: Once | INTRAVENOUS | Status: AC
  Administered 2014-12-28: 1000 mg via INTRAVENOUS

## 2014-12-28 MED ORDER — SODIUM CHLORIDE 0.9 % IV SOLN: 100.0000 mL | INTRAVENOUS | Status: DC | PRN

## 2014-12-28 MED ORDER — SODIUM CHLORIDE 0.9 % IV SOLN: Freq: Once | INTRAVENOUS | Status: DC

## 2014-12-28 MED ORDER — LIDOCAINE-PRILOCAINE 2.5-2.5 % EX CREA: 1.0000 "application " | TOPICAL_CREAM | CUTANEOUS | Status: DC | PRN

## 2014-12-28 MED ORDER — LIDOCAINE HCL (PF) 1 % IJ SOLN: 5.0000 mL | INTRAMUSCULAR | Status: DC | PRN

## 2014-12-28 MED ORDER — PREDNISONE 10 MG PO TABS
10.0000 mg | ORAL_TABLET | Freq: Every day | ORAL | Status: AC
  Administered 2014-12-30 – 2014-12-31 (×2): 10 mg via ORAL
  Filled 2014-12-28 (×2): qty 1

## 2014-12-28 NOTE — Clinical Social Work Note (Signed)
CSW met with MD this afternoon regarding possible need for SNF placement. MD requested CSW look into whether or not patient can go to SNF with a temporary femoral HD cath. CSW consulted with renal CSW. Per renal CSW patient should be able to go to SNF with this. CSW would need to send referral to SNFs close to where patient receives HD to see if the patient will have any SNF options. With the patient only having Medicaid, the options for placement may be limited. MD please put in formal CSW consult for SNF placement if team/patient decides this is a discharge option that needs to be pursued. CSW will leave report for weekend CSW.    Liz Beach MSW, [place], [place], [phone]

## 2014-12-28 NOTE — Evaluation (Signed)
Occupational Therapy Evaluation Patient Details Name: [name] MRN: [id number] DOB: [birth year] Today's Date: 12/28/2014    History of Present Illness 39 y/o female with PMH of SVT s/p ablation, anemia of chronic disease, HTN, SLE with recently diagnosed lupus nephritis who p/w dysuria * 2 days. Patient states that she recently underwent a renal biopsy on 11/22/14 and was diagnosed with lupus nephritis. The procedure was complicated by post procedure blood loss and received 2 units PRBC. She was started on cellcept and prednisone 60 mg for the lupus nephritis. She presented with dysuria, subjective fevers, nausea and 1 day of hematuria to the ED.  Pt  with new R MCA CVA as well and now on HD for renal failure.   Clinical Impression   Limited OT eval at this time secondary to pt not being able to get OOB because of femoral HD catheter.  Pt presents with some LUE weakness.  Educated and handout provided on theraputty exercises to begin strengthening.  Will continue to follow for acute care OT to help with LUE strengthening and with anticipation of pt being allowed to get OOB once catheter is removed.  Unsure of follow-up recommendations until we can mobilize pt.     Follow Up Recommendations  Other (comment) (TBD once pt is allowed mobility OOB)    Equipment Recommendations  Other (comment) (TBD when pt is allowed to transfer OOB to further assess balance)       Precautions / Restrictions Precautions Precaution Comments: Pt still with femoral HD cathetor unable to get OOB Restrictions Weight Bearing Restrictions: No      Mobility Bed Mobility               General bed mobility comments: Not tested   Transfers                      Balance                                            ADL Overall ADL's : Needs assistance/impaired Eating/Feeding: Modified independent;Bed level   Grooming: Wash/dry hands;Wash/dry face;Set up;Bed level   Upper  Body Bathing: Set up;Bed level                             General ADL Comments: Limited eval secondary to pt still with femoral HD cathetor and on bedrest.  Pt issued red medium resistance theraputty and exercise handout for strengthening.  Reviewed exercises and pt is able to complete them with supervision.       Vision Vision Assessment?: No apparent visual deficits   Perception Perception Perception Tested?: No   Praxis Praxis Praxis tested?: Within functional limits    Pertinent Vitals/Pain Pain Assessment: 0-10 Pain Score: 4  Pain Location: left knee Pain Descriptors / Indicators: Aching Pain Intervention(s): Monitored during session;Limited activity within patient's tolerance     Hand Dominance Right   Extremity/Trunk Assessment Upper Extremity Assessment Upper Extremity Assessment: LUE deficits/detail LUE Deficits / Details: AROM shoulder flexion 0-110 degrees, all other joints AROM WFLS.  Strength 3+/5 in shoulder, elbow, and grip LUE Sensation:  (WFLs)   Lower Extremity Assessment Lower Extremity Assessment: Defer to PT evaluation       Communication Communication Communication: No difficulties   Cognition Arousal/Alertness: Awake/alert Behavior During Therapy: [facility] for tasks assessed/performed  Overall Cognitive Status: Within Functional Limits for tasks assessed       Memory:  [facility] for gross testing.  Will continue to monitor in treatment.)             General Comments       Exercises       Shoulder Instructions      Home Living Family/patient expects to be discharged to:: Private residence Living Arrangements: Children;Parent   Type of Home: Apartment Home Access: Level entry     Home Layout: One level     Bathroom Shower/Tub: Tub/shower unit Shower/tub characteristics: Architectural technologist: Standard Bathroom Accessibility: Yes How Accessible: Accessible via walker Home Equipment: Bedside commode;Walker -  standard   Additional Comments: walker was her mothers      Prior Functioning/Environment Level of Independence: Independent             OT Diagnosis: Generalized weakness   OT Problem List: Decreased strength;Decreased range of motion   OT Treatment/Interventions: Self-care/ADL training;Therapeutic activities;Balance training;DME and/or AE instruction;Patient/family education;Therapeutic exercise    OT Goals(Current goals can be found in the care plan section) Acute Rehab OT Goals Patient Stated Goal: Pt did not state this session OT Goal Formulation: With patient Time For Goal Achievement: 01/04/15 Potential to Achieve Goals: Good  OT Frequency: Min 2X/week   Barriers to D/C:            Co-evaluation              End of Session Nurse Communication: Other (comment) (Issue of theraputty)  Activity Tolerance: Patient tolerated treatment well Patient left: in bed;with call bell/phone within reach   Time: 1337-1411 OT Time Calculation (min): 34 min Charges:  OT General Charges $OT Visit: 1 Procedure OT Treatments $Therapeutic Exercise: 8-22 mins  MCGUIRE,JAMES OTR/L 12/28/2014, 2:26 PM

## 2014-12-28 NOTE — Progress Notes (Signed)
Report Given to Lucien, RN in Hemodialysis.

## 2014-12-28 NOTE — Progress Notes (Signed)
Patient ID: [name], female   DOB: 1976-01-03, 39 y.o.   MRN: [id number]  [facility] Progress Note   Assessment/ Plan:   1. 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 Currently on TThS schedule, has outpatient HD spot. Placement of left thigh graft postponed given recent stroke. HD today. Will continue Prednisone 20 mg daily for today and then taper to 10 mg for 2 days and then 5 mg for 1 day. Will check UA to see if patient has UTI.  2. 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 Patient with residual left-sided extremity and facial weakness. Currently on baby aspirin and atorvastatin. Lupus anticoagulant negative. TEE showed a vegetation on the mitral valve and likely to be source of emboli. Primary team working up for infectious endocarditis vs marantic endocarditis. Leaning towards marantic. Blood cultures negative so far. Currently on Vancomycin empirically. Rheumatology was consulted and based on that conversation warfarin was started. Plaquenil 200 mg daily also started by primary team on 9/28. Once INR 2.0-2.5 can discontinue ASA per neurology. INR 1.04 this AM. Parvovirus and Bartonella antibodies negative. Q fever antibodies, Brucella, and Legionella antibodies pending.  3. Hypertension: BP in 270J-500X systolic. Continue Toprol-XL, Norvasc, and Hydralazine.  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 PTH level 71. Will need to have PTH monitoring, check in 3 months.  5. Pancytopenia: Platelets continue to rise, 99,000 from 88,0000. Hgb dropped again today from 8.5 to 7.0. She just received 1 unit PRBCs on 9/28. Primary team giving another unit today. Leukopenia worse this AM with WBCs 1.3 from 1.8. Suspect pancytopenia from bone marrow suppression from  Cellcept, although infectious endocarditis not completely ruled out yet. Cellcept discontinued on 9/26, tapering Prednisone. Continue Aranesp.    Subjective:   No acute events overnight. Patient complaining of some dysuria and frequency this morning. Hgb dropped again this AM to 7.0 from 8.5. Patient getting 1 unit PRBCs today.    Objective:   BP 143/77 mmHg  Pulse 87  Temp(Src) 97.9 F (36.6 C) (Oral)  Resp 18  Ht 5\' 3"  (1.6 m)  Wt 219 lb (99.338 kg)  BMI 38.80 kg/m2  SpO2 100%  LMP 11/17/2014 (Approximate)  Intake/Output Summary (Last 24 hours) at 12/28/14 0721 Last data filed at 12/28/14 0525  Gross per 24 hour  Intake    860 ml  Output    700 ml  Net    160 ml   Weight change:   Physical Exam: Gen: alert, resting in HD bed, pleasant, NAD  CVS: RRR, no m/g/r Resp: CTA bilaterally, breaths non-labored Abd: BS+, soft, obese, non-tender Ext: No LE edema. Right femoral tunneled dialysis catheter without bleeding Neuro: alert and oriented x 4, 3/5 strength in LUE, 4/5 LLE, 5/5 in RUE and RLE. Slight left facial droop present.   Imaging: No results found.  Labs: BMET  Recent Labs Lab 12/22/14 0441 12/23/14 0636 12/24/14 0500 12/25/14 0440 12/26/14 0430 12/27/14 0413 12/28/14 0453  NA 137 135 135 132* 133* 137 138  K 4.3 4.0 3.9 4.3 3.6 3.8 3.7  CL 100* 97* 100* 96* 97* 101 103  CO2 26 27 26 23 26 26 26   GLUCOSE 125* 120* 91 109* 175* 136* 89  BUN 65* 40* 52* 60* 39* 61* 75*  CREATININE 5.30* 3.75* 4.47* 5.03* 3.71* 4.71* 5.40*  CALCIUM 8.2* 8.4* 8.3* 8.3* 7.7* 8.2* 8.1*  PHOS 6.2* 4.5 4.5 6.6*  --  3.8 3.6   CBC  Recent Labs Lab 12/24/14 1110 12/25/14 1530 12/25/14 1846 12/26/14 0845 12/26/14 1055 12/27/14 0413 12/28/14 0453  WBC 1.7* 3.5*  --  1.9*  --  1.8* 1.3*  NEUTROABS 1.2*  --  2.5 1.7  --   --   --   HGB 7.5* 7.6*  --  6.2* 6.3* 8.5* 7.0*  HCT 22.6* 22.2*  --  18.8* 19.0* 25.7* 21.5*  MCV 90.0 88.4  --  90.0  --  89.9 90.7  PLT 56* 67*   --  73*  --  88* 99*    Medications:    . sodium chloride   Intravenous Once  . sodium chloride   Intravenous Once  . amLODipine  10 mg Oral Daily  . aspirin EC  81 mg Oral Daily  . atorvastatin  20 mg Oral q1800  . calcium acetate  1,334 mg Oral TID WC  . cyclobenzaprine  10 mg Oral QHS  . [START ON 01/03/2015] darbepoetin (ARANESP) injection - DIALYSIS  100 mcg Intravenous Q Thu-HD  . feeding supplement (PRO-STAT SUGAR FREE 64)  30 mL Oral TID BM  . folic acid  1 mg Oral Daily  . hydrALAZINE  50 mg Oral 3 times per day  . hydroxychloroquine  200 mg Oral Daily  . isosorbide mononitrate  30 mg Oral Daily  . metoprolol succinate  100 mg Oral Daily  . multivitamin  1 tablet Oral QHS  . pantoprazole  20 mg Oral Daily  . predniSONE  20 mg Oral Q breakfast  . vancomycin  1,000 mg Intravenous Q T,Th,Sa-HD  . warfarin  4 mg Oral q1800  . Warfarin - Physician Dosing Inpatient   Does not apply A2633   [name], MD, MPH Internal Medicine PGY-2  12/28/2014, 7:21 AM

## 2014-12-28 NOTE — Progress Notes (Deleted)
CRITICAL VALUE ALERT  Critical value received: WBC 1.8  Date of notification:  12/28/14  Time of notification:  0557  Critical value read back:Yes.    Nurse who received alert:  Theola Sequin RN  MD notified (1st page):  Internal Medicine   Time of first page:  0557  MD notified (2nd page):  Time of second page:  Responding MD:  Marijean Bravo MD  Time MD responded:  [phone]

## 2014-12-28 NOTE — Discharge Instructions (Addendum)
Your recent painful, bloody urination was due to a bacterial infection of your urinary tract. You were treated with a full course of antibiotics.  You will need to continue to take the antibiotic Vancomycin for a total of 3 weeks (until 01/22/15).  Your cholesterol medicine simvastatin was changed to atorvastatin due to a possible drug interaction with your other medicines. Continue taking this new medicine once daily.  You were started on a blood thinner medication called Coumadin. You will need to take this once daily and have a blood level called INR checked to make sure it is working correctly.  Continue all other medications as instructed on discharge.  Please follow up with all scheduled appointments to monitor your progress and inform your physicians if you develop new or worsening symptoms.  Information on my medicine - Coumadin   (Warfarin)  This medication education was reviewed with me or my healthcare representative as part of my discharge preparation.  The pharmacist that spoke with me during my hospital stay was:  Tad Moore, [facility]  Why was Coumadin prescribed for you? Coumadin was prescribed for you because you have a blood clot or a medical condition that can cause an increased risk of forming blood clots. Blood clots can cause serious health problems by blocking the flow of blood to the heart, lung, or brain. Coumadin can prevent harmful blood clots from forming. As a reminder your indication for Coumadin is:   Blood Clotting Disorder  What test will check on my response to Coumadin? While on Coumadin (warfarin) you will need to have an INR test regularly to ensure that your dose is keeping you in the desired range. The INR (international normalized ratio) number is calculated from the result of the laboratory test called prothrombin time (PT).  If an INR APPOINTMENT HAS NOT ALREADY BEEN MADE FOR YOU please schedule an appointment to have this lab work done by your health  care provider within 7 days. Your INR goal is usually a number between:  2 to 3 or your provider may give you a more narrow range like 2-2.5.  Ask your health care provider during an office visit what your goal INR is.  What  do you need to  know  About  COUMADIN? Take Coumadin (warfarin) exactly as prescribed by your healthcare provider about the same time each day.  DO NOT stop taking without talking to the doctor who prescribed the medication.  Stopping without other blood clot prevention medication to take the place of Coumadin may increase your risk of developing a new clot or stroke.  Get refills before you run out.  What do you do if you miss a dose? If you miss a dose, take it as soon as you remember on the same day then continue your regularly scheduled regimen the next day.  Do not take two doses of Coumadin at the same time.  Important Safety Information A possible side effect of Coumadin (Warfarin) is an increased risk of bleeding. You should call your healthcare provider right away if you experience any of the following: ? Bleeding from an injury or your nose that does not stop. ? Unusual colored urine (red or dark brown) or unusual colored stools (red or black). ? Unusual bruising for unknown reasons. ? A serious fall or if you hit your head (even if there is no bleeding).  Some foods or medicines interact with Coumadin (warfarin) and might alter your response to warfarin. To help avoid this: ?  Eat a balanced diet, maintaining a consistent amount of Vitamin K. ? Notify your provider about major diet changes you plan to make. ? Avoid alcohol or limit your intake to 1 drink for women and 2 drinks for men per day. (1 drink is 5 oz. wine, 12 oz. beer, or 1.5 oz. liquor.)  Make sure that ANY health care provider who prescribes medication for you knows that you are taking Coumadin (warfarin).  Also make sure the healthcare provider who is monitoring your Coumadin knows when you have  started a new medication including herbals and non-prescription products.  Coumadin (Warfarin)  Major Drug Interactions  Increased Warfarin Effect Decreased Warfarin Effect  Alcohol (large quantities) Antibiotics (esp. Septra/Bactrim, Flagyl, Cipro) Amiodarone (Cordarone) Aspirin (ASA) Cimetidine (Tagamet) Megestrol (Megace) NSAIDs (ibuprofen, naproxen, etc.) Piroxicam (Feldene) Propafenone (Rythmol SR) Propranolol (Inderal) Isoniazid (INH) Posaconazole (Noxafil) Barbiturates (Phenobarbital) Carbamazepine (Tegretol) Chlordiazepoxide (Librium) Cholestyramine (Questran) Griseofulvin Oral Contraceptives Rifampin Sucralfate (Carafate) Vitamin K   Coumadin (Warfarin) Major Herbal Interactions  Increased Warfarin Effect Decreased Warfarin Effect  Garlic Ginseng Ginkgo biloba Coenzyme Q10 Green tea St. Johns wort    Coumadin (Warfarin) FOOD Interactions  Eat a consistent number of servings per week of foods HIGH in Vitamin K (1 serving =  cup)  Collards (cooked, or boiled & drained) Kale (cooked, or boiled & drained) Mustard greens (cooked, or boiled & drained) Parsley *serving size only =  cup Spinach (cooked, or boiled & drained) Swiss chard (cooked, or boiled & drained) Turnip greens (cooked, or boiled & drained)  Eat a consistent number of servings per week of foods MEDIUM-HIGH in Vitamin K (1 serving = 1 cup)  Asparagus (cooked, or boiled & drained) Broccoli (cooked, boiled & drained, or raw & chopped) Brussel sprouts (cooked, or boiled & drained) *serving size only =  cup Lettuce, raw (green leaf, endive, romaine) Spinach, raw Turnip greens, raw & chopped   These websites have more information on Coumadin (warfarin):  FailFactory.se; VeganReport.com.au;

## 2014-12-28 NOTE — Progress Notes (Addendum)
Subjective: Patient feeling well today. Will start working with OT on upper extremity strength and coordination today, PT deferred due to HD catheter. Tolerated hemodialysis today without difficulty. Discussed plan of care, patient may need additional surgery for permanent access deferred due to recent stroke.  Objective: Vital signs in last 24 hours: Filed Vitals:   12/28/14 1040 12/28/14 1055 12/28/14 1100 12/28/14 1338  BP: 130/83 149/70 142/84 136/73  Pulse: 100 96 100 103  Temp: 98.3 F (36.8 C) 98.3 F (36.8 C) 98.3 F (36.8 C) 99.3 F (37.4 C)  TempSrc:  Oral Oral Oral  Resp: 19 18 18 16   Height:      Weight:      SpO2:   100% 99%   Weight change:   Intake/Output Summary (Last 24 hours) at 12/28/14 1552 Last data filed at 12/28/14 1100  Gross per 24 hour  Intake    455 ml  Output   3400 ml  Net  -2945 ml   GENERAL- alert, oriented, NAD HEENT- symmetric appearing on exam, EOMI, PERRL CARDIAC- RRR, no murmurs, rubs or gallops. RESP- CTAB, no wheezes or crackles. EXTREMITIES- 2+ palpable distal pulses, trace pedal edema, dry dressings intact around R fem catheter and LUE AVG site SKIN- Warm, dry, ecchymoses around LIJ incision site NEURO- speech is clear, LUE strength is 4/5 today on exam. RUE WNL PSYCH- appropriate mood and affect, appropriate speech  Lab Results: Basic Metabolic Panel:  Recent Labs Lab 12/27/14 0413 12/28/14 0453  NA 137 138  K 3.8 3.7  CL 101 103  CO2 26 26  GLUCOSE 136* 89  BUN 61* 75*  CREATININE 4.71* 5.40*  CALCIUM 8.2* 8.1*  PHOS 3.8 3.6   Liver Function Tests:  Recent Labs Lab 12/24/14 1226  12/26/14 0430 12/27/14 0413 12/28/14 0453  AST 24  --  23  --   --   ALT 9*  --  9*  --   --   ALKPHOS 53  --  53  --   --   BILITOT 0.6  --  0.6  --   --   PROT 4.6*  --  5.1*  --   --   ALBUMIN 2.2*  < > 2.0* 2.2* 2.3*  < > = values in this interval not displayed. No results for input(s): LIPASE, AMYLASE in the last 168  hours. No results for input(s): AMMONIA in the last 168 hours. CBC:  Recent Labs Lab 12/25/14 1846 12/26/14 0845  12/27/14 0413 12/28/14 0453  WBC  --  1.9*  --  1.8* 1.3*  NEUTROABS 2.5 1.7  --   --   --   HGB  --  6.2*  < > 8.5* 7.0*  HCT  --  18.8*  < > 25.7* 21.5*  MCV  --  90.0  --  89.9 90.7  PLT  --  73*  --  88* 99*  < > = values in this interval not displayed. Cardiac Enzymes: No results for input(s): CKTOTAL, CKMB, CKMBINDEX, TROPONINI in the last 168 hours. BNP: No results for input(s): PROBNP in the last 168 hours. D-Dimer: No results for input(s): DDIMER in the last 168 hours. CBG: No results for input(s): GLUCAP in the last 168 hours. Hemoglobin A1C:  Recent Labs Lab 12/22/14 0441  HGBA1C 6.1*   Fasting Lipid Panel:  Recent Labs Lab 12/21/14 1638  CHOL 113  HDL 53  LDLCALC 39  TRIG 107  CHOLHDL 2.1   Thyroid Function Tests: No results  for input(s): TSH, T4TOTAL, FREET4, T3FREE, THYROIDAB in the last 168 hours. Coagulation:  Recent Labs Lab 12/27/14 0413 12/28/14 0453  LABPROT 13.4 13.8  INR 1.00 1.04   Anemia Panel: No results for input(s): VITAMINB12, FOLATE, FERRITIN, TIBC, IRON, RETICCTPCT in the last 168 hours. Urine Drug Screen: Drugs of Abuse     Component Value Date/Time   LABOPIA NONE DETECTED 07/21/2012 0215   COCAINSCRNUR NONE DETECTED 07/21/2012 0215   LABBENZ NONE DETECTED 07/21/2012 0215   AMPHETMU NONE DETECTED 07/21/2012 0215   THCU NONE DETECTED 07/21/2012 0215   LABBARB NONE DETECTED 07/21/2012 0215    Alcohol Level: No results for input(s): ETH in the last 168 hours. Urinalysis: No results for input(s): COLORURINE, LABSPEC, PHURINE, GLUCOSEU, HGBUR, BILIRUBINUR, KETONESUR, PROTEINUR, UROBILINOGEN, NITRITE, LEUKOCYTESUR in the last 168 hours.  Invalid input(s): APPERANCEUR   Micro Results: Recent Results (from the past 240 hour(s))  Culture, blood (routine x 2)     Status: None (Preliminary result)    Collection Time: 12/24/14 12:26 PM  Result Value Ref Range Status   Specimen Description BLOOD LEFT HAND  Final   Special Requests IN PEDIATRIC BOTTLE  Blue Eye  Final   Culture NO GROWTH 4 DAYS  Final   Report Status PENDING  Incomplete  Culture, blood (routine x 2)     Status: None (Preliminary result)   Collection Time: 12/24/14 12:40 PM  Result Value Ref Range Status   Specimen Description BLOOD RIGHT HAND  Final   Special Requests BOTTLES DRAWN AEROBIC AND ANAEROBIC 5CC  Final   Culture NO GROWTH 4 DAYS  Final   Report Status PENDING  Incomplete  Culture, blood (routine x 2)     Status: None (Preliminary result)   Collection Time: 12/25/14  1:20 PM  Result Value Ref Range Status   Specimen Description BLOOD HEMODIALYSIS CATHETER  Final   Special Requests BOTTLES DRAWN AEROBIC AND ANAEROBIC 10CC  Final   Culture NO GROWTH 3 DAYS  Final   Report Status PENDING  Incomplete  Culture, blood (routine x 2)     Status: None (Preliminary result)   Collection Time: 12/25/14  6:59 PM  Result Value Ref Range Status   Specimen Description BLOOD BLOOD RIGHT FOREARM  Final   Special Requests IN PEDIATRIC BOTTLE 1CC  Final   Culture NO GROWTH 3 DAYS  Final   Report Status PENDING  Incomplete   Studies/Results: No results found. Medications: I have reviewed the patient's current medications. Scheduled Meds: . sodium chloride   Intravenous Once  . sodium chloride   Intravenous Once  . amLODipine  10 mg Oral Daily  . aspirin EC  81 mg Oral Daily  . atorvastatin  20 mg Oral q1800  . calcium acetate  1,334 mg Oral TID WC  . cyclobenzaprine  10 mg Oral QHS  . [START ON 01/03/2015] darbepoetin (ARANESP) injection - DIALYSIS  100 mcg Intravenous Q Thu-HD  . feeding supplement (PRO-STAT SUGAR FREE 64)  30 mL Oral TID BM  . folic acid  1 mg Oral Daily  . hydrALAZINE  50 mg Oral 3 times per day  . hydroxychloroquine  200 mg Oral Daily  . isosorbide mononitrate  30 mg Oral Daily  . metoprolol  succinate  100 mg Oral Daily  . multivitamin  1 tablet Oral QHS  . pantoprazole  20 mg Oral Daily  . [START ON 12/30/2014] predniSONE  10 mg Oral Q breakfast   Followed by  . [START ON 01/01/2015] predniSONE  5 mg Oral Q breakfast  . predniSONE  20 mg Oral Q breakfast  . warfarin  4 mg Oral q1800  . Warfarin - Physician Dosing Inpatient   Does not apply q1800   Continuous Infusions: . 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 Day 3 of warfarin. Will start checking INR at appropriate timing and goal is 2.0-2.5. - Warfarin 4 mg daily - ASA 81mg  - Start checking PT/INr tmrw labs  Endocarditis Continuing to follow up blood cultures and studies for seronegative infective endocarditis. No acute indications for other interventions at this time. Starting patient on plaquenil as this is likely related to her SLE and needs continued management. 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 Continues HD at nephrology discretion, daily labs. 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 Platelets increased to 99,000. WBCs 1.3. - Supplement folic acid - Continue transfuse PRN to Hgb > 7 - Follow qAM CBCs  HTN: Controlled to 130s-140s today. Continuing appropriate medications. - Amlodipine 10mg  PO   - Hydralazine 50mg  PO TID - Continue Imdur 30mg  -Metoprolol succinate 100mg  PO   Pain: No active complaints - Percocet 5-325mg  q6hrs PRN - Flexeril 10mg  qHS PRN  Stage 2 sacral pressure ulcer Now on air mattress. Wounds are being appropriately dressed and no concerning findings of warmth, erythema, skin breakdown. - Wound care evaluated, current dressings and treatment appropriate  Candidal rash: Resolved  SVT: s/p ablation 2014 HLD: Atorvastatin 20mg  PO Hyperkalemia: Resolved  FEN: Renal diet, fluid restricted DVT ppx: SCDs FULL CODE  Dispo: Disposition deferred until improvement in clinical condition.  The patient does have a current PCP Lin Landsman, MD) and does not need an [facility] hospital follow-up appointment after discharge.  The patient does not have transportation limitations that hinder transportation to clinic appointments.   LOS: 25 days   Collier Salina, MD 12/28/2014, 3:52 PM

## 2014-12-28 NOTE — Progress Notes (Signed)
CRITICAL VALUE ALERT  Critical value received:  WBC 1.8  Date of notification:  12/28/14  Time of notification:  0557  Critical value read back:Yes.    Nurse who received alert:  Theola Sequin RN  MD notified (1st page):  Internal Medicine  Time of first page:  0559  MD notified (2nd page):  Time of second page:  Responding MD:  Marijean Bravo MD  Time MD responded:  0600

## 2014-12-29 DIAGNOSIS — D619 Aplastic anemia, unspecified: Secondary | ICD-10-CM | POA: Insufficient documentation

## 2014-12-29 DIAGNOSIS — I63311 Cerebral infarction due to thrombosis of right middle cerebral artery: Secondary | ICD-10-CM

## 2014-12-29 LAB — URINE MICROSCOPIC-ADD ON

## 2014-12-29 LAB — URINALYSIS, ROUTINE W REFLEX MICROSCOPIC
Bilirubin Urine: NEGATIVE
Glucose, UA: NEGATIVE mg/dL
Ketones, ur: NEGATIVE mg/dL
Nitrite: POSITIVE — AB
Specific Gravity, Urine: 1.02 (ref 1.005–1.030)
UROBILINOGEN UA: 0.2 mg/dL (ref 0.0–1.0)
pH: 7 (ref 5.0–8.0)

## 2014-12-29 LAB — RENAL FUNCTION PANEL
Albumin: 2.2 g/dL — ABNORMAL LOW (ref 3.5–5.0)
Anion gap: 7 (ref 5–15)
BUN: 43 mg/dL — AB (ref 6–20)
CALCIUM: 7.8 mg/dL — AB (ref 8.9–10.3)
CHLORIDE: 100 mmol/L — AB (ref 101–111)
CO2: 29 mmol/L (ref 22–32)
CREATININE: 3.7 mg/dL — AB (ref 0.44–1.00)
GFR calc Af Amer: 17 mL/min — ABNORMAL LOW (ref >60)
GFR calc non Af Amer: 15 mL/min — ABNORMAL LOW (ref >60)
GLUCOSE: 94 mg/dL (ref 65–99)
Phosphorus: 3.1 mg/dL (ref 2.5–4.6)
Potassium: 4.3 mmol/L (ref 3.5–5.1)
SODIUM: 136 mmol/L (ref 135–145)

## 2014-12-29 LAB — CBC
HCT: 25.4 % — ABNORMAL LOW (ref 36.0–46.0)
Hemoglobin: 8.4 g/dL — ABNORMAL LOW (ref 12.0–15.0)
MCH: 29.9 pg (ref 26.0–34.0)
MCHC: 33.1 g/dL (ref 30.0–36.0)
MCV: 90.4 fL (ref 78.0–100.0)
PLATELETS: 97 10*3/uL — AB (ref 150–400)
RBC: 2.81 MIL/uL — ABNORMAL LOW (ref 3.87–5.11)
RDW: 15.3 % (ref 11.5–15.5)
WBC: 1.6 10*3/uL — AB (ref 4.0–10.5)

## 2014-12-29 LAB — TYPE AND SCREEN
ABO/RH(D): A POS
ANTIBODY SCREEN: NEGATIVE
UNIT DIVISION: 0
UNIT DIVISION: 0

## 2014-12-29 LAB — CULTURE, BLOOD (ROUTINE X 2)
CULTURE: NO GROWTH
CULTURE: NO GROWTH

## 2014-12-29 LAB — PROTIME-INR
INR: 1.09 (ref 0.00–1.49)
Prothrombin Time: 14.3 seconds (ref 11.6–15.2)

## 2014-12-29 LAB — Q FEVER ABS IGG, IGM W/ REFLEX TITER
Q FEVER IGG PHASE I SCREEN 1: NEGATIVE
Q FEVER IGG PHASE II SCREEN 1: NEGATIVE
Q FEVER IGM PHASE I SCREEN 1: NEGATIVE
Q FEVER IGM PHASE II SCREEN 1: NEGATIVE

## 2014-12-29 MED ORDER — CEFAZOLIN SODIUM 1-5 GM-% IV SOLN
1.0000 g | INTRAVENOUS | Status: DC
  Administered 2014-12-29 – 2014-12-30 (×2): 1 g via INTRAVENOUS
  Filled 2014-12-29 (×3): qty 50

## 2014-12-29 NOTE — Progress Notes (Signed)
Patient ID: [name], female   DOB: 08-12-1975, 39 y.o.   MRN: [id number] Hematology: Overall stable. No new neuro signs or symptoms day #4 coumadin at 4 mg daily. Target INR 2-2.5 for now: still on ASA which can be stopped when INR therapeutic. Protein S activity normal Lupus anticoagulant not detected. Parvovirus not detected. WBC slow to recover but not falling Platelets inching up Impression: 1. 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 Pancytopenia likely due to cellcept - slowly improving off cellcept. 3. 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 From what I read in literature, this can occur with lupus even in absence of antiphospholipid antibodies.   Blood cultures from 9/26 & 9/27 negative so far. No fever. No murmurs. Being covered with Vancomycin.  Given complexity of this case, I would keep her in hospital until coumadin therapeutic. Little room for error here.

## 2014-12-29 NOTE — Progress Notes (Signed)
ANTIBIOTIC CONSULT NOTE - INITIAL  Pharmacy Consult for Cefazolin Indication: endocarditis, UTI  Allergies  Allergen Reactions  . Food Swelling    Red peppers   Labs:  Recent Labs  12/27/14 0413 12/28/14 0453 12/28/14 2025 12/29/14 0550  WBC 1.8* 1.3* 1.7* 1.6*  HGB 8.5* 7.0* 9.0* 8.4*  PLT 88* 99* 94* 97*  CREATININE 4.71* 5.40*  --  3.70*   Estimated Creatinine Clearance: 23.2 mL/min (by C-G formula based on Cr of 3.7). No results for input(s): VANCOTROUGH, VANCOPEAK, VANCORANDOM, GENTTROUGH, GENTPEAK, GENTRANDOM, TOBRATROUGH, TOBRAPEAK, TOBRARND, AMIKACINPEAK, AMIKACINTROU, AMIKACIN in the last 72 hours.    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  Vanc d/c'd and cefazolin started for endocarditis. Valvular vegetation revealed on TEE. HD off schedule - last 9/30.  Ceftriaxone 9/5>>9/6, 9/7>>9/8 Cipro 9/6>>9/7 Cephalexin 9/8>>9/12 Cefuroxime>9/26 x 1 for AVG surgery  Vancomycin 9/27>10/1 Cefazolin 10/1>>  Urine 9/5>> > K pneumo susceptible to CTX/Cefazolin BC x 2 9/26> NGTD BC x 2 9/27> NGTD UA 10/1: nitrite positive, bacteria many  Goal of Therapy:  Eradication of infection  Plan:  D/c vanc Cefazolin 1g IV daily Mon clinical progress, c/s, abx plan  Elicia Lamp, PharmD Clinical Pharmacist Pager [phone] 12/29/2014 7:47 PM

## 2014-12-29 NOTE — Progress Notes (Signed)
[facility] ROUNDING NOTE   Subjective:   Interval History: no complaints  Objective:  Vital signs in last 24 hours:  Temp:  [98.3 F (36.8 C)-99.3 F (37.4 C)] 98.3 F (36.8 C) (10/01 1334) Pulse Rate:  [92-103] 99 (10/01 1334) Resp:  [16-20] 20 (10/01 1334) BP: (129-136)/(67-75) 135/67 mmHg (10/01 1334) SpO2:  [97 %-100 %] 98 % (10/01 1334)  Weight change:  Filed Weights   12/27/14 0500 12/27/14 0549 12/28/14 0703  Weight: 100.245 kg (221 lb) 99.338 kg (219 lb) 99.6 kg (219 lb 9.3 oz)    Intake/Output: I/O last 3 completed shifts: In: 455 [P.O.:120; Blood:335] Out: 3400 [Urine:400; Other:3000]   Intake/Output this shift:  Total I/O In: 500 [P.O.:500] Out: 300 [Urine:300]  Gen: alert, resting in HD bed, pleasant, NAD  CVS: RRR, no m/g/r Resp: CTA bilaterally, breaths non-labored Abd: BS+, soft, obese, non-tender Ext: No LE edema. Right femoral tunneled dialysis catheter without bleeding Neuro: alert and oriented x 4, 3/5 strength in LUE, 4/5 LLE, 5/5 in RUE and RLE. Slight left facial droop present.     Basic Metabolic Panel:  Recent Labs Lab 12/24/14 0500 12/25/14 0440 12/26/14 0430 12/27/14 0413 12/28/14 0453 12/29/14 0550  NA 135 132* 133* 137 138 136  K 3.9 4.3 3.6 3.8 3.7 4.3  CL 100* 96* 97* 101 103 100*  CO2 26 23 26 26 26 29   GLUCOSE 91 109* 175* 136* 89 94  BUN 52* 60* 39* 61* 75* 43*  CREATININE 4.47* 5.03* 3.71* 4.71* 5.40* 3.70*  CALCIUM 8.3* 8.3* 7.7* 8.2* 8.1* 7.8*  PHOS 4.5 6.6*  --  3.8 3.6 3.1    Liver Function Tests:  Recent Labs Lab 12/24/14 1226 12/25/14 0440 12/26/14 0430 12/27/14 0413 12/28/14 0453 12/29/14 0550  AST 24  --  23  --   --   --   ALT 9*  --  9*  --   --   --   ALKPHOS 53  --  53  --   --   --   BILITOT 0.6  --  0.6  --   --   --   PROT 4.6*  --  5.1*  --   --   --   ALBUMIN 2.2* 2.0* 2.0* 2.2* 2.3* 2.2*   No results for input(s): LIPASE, AMYLASE in the last 168 hours. No results for  input(s): AMMONIA in the last 168 hours.  CBC:  Recent Labs Lab 12/24/14 1110  12/25/14 1846 12/26/14 0845 12/26/14 1055 12/27/14 0413 12/28/14 0453 12/28/14 1656 12/28/14 2025 12/29/14 0550  WBC 1.7*  < >  --  1.9*  --  1.8* 1.3*  --  1.7* 1.6*  NEUTROABS 1.2*  --  2.5 1.7  --   --   --  0.8*  --   --   HGB 7.5*  < >  --  6.2* 6.3* 8.5* 7.0*  --  9.0* 8.4*  HCT 22.6*  < >  --  18.8* 19.0* 25.7* 21.5*  --  27.9* 25.4*  MCV 90.0  < >  --  90.0  --  89.9 90.7  --  91.5 90.4  PLT 56*  < >  --  73*  --  88* 99*  --  94* 97*  < > = values in this interval not displayed.  Cardiac Enzymes: No results for input(s): CKTOTAL, CKMB, CKMBINDEX, TROPONINI in the last 168 hours.  BNP: Invalid input(s): POCBNP  CBG: No results for input(s): GLUCAP in  the last 168 hours.  Microbiology: Results for orders placed or performed during the hospital encounter of 12/03/14  Urine culture     Status: None   Collection Time: 12/03/14  1:44 PM  Result Value Ref Range Status   Specimen Description URINE, CLEAN CATCH  Final   Special Requests Normal  Final   Culture >=100,000 COLONIES/mL KLEBSIELLA PNEUMONIAE  Final   Report Status 12/06/2014 FINAL  Final   Organism ID, Bacteria KLEBSIELLA PNEUMONIAE  Final      Susceptibility   Klebsiella pneumoniae - MIC*    AMPICILLIN 4 RESISTANT Resistant     CEFAZOLIN <=4 SENSITIVE Sensitive     CEFTRIAXONE <=1 SENSITIVE Sensitive     CIPROFLOXACIN <=0.25 SENSITIVE Sensitive     GENTAMICIN <=1 SENSITIVE Sensitive     IMIPENEM <=0.25 SENSITIVE Sensitive     NITROFURANTOIN 64 INTERMEDIATE Intermediate     TRIMETH/SULFA <=20 SENSITIVE Sensitive     AMPICILLIN/SULBACTAM 4 SENSITIVE Sensitive     PIP/TAZO 8 SENSITIVE Sensitive     * >=100,000 COLONIES/mL KLEBSIELLA PNEUMONIAE  Surgical pcr screen     Status: None   Collection Time: 12/18/14  7:56 AM  Result Value Ref Range Status   MRSA, PCR NEGATIVE NEGATIVE Final   Staphylococcus aureus NEGATIVE  NEGATIVE Final    Comment:        The Xpert SA Assay (FDA approved for NASAL specimens in patients over 55 years of age), is one component of a comprehensive surveillance program.  Test performance has been validated by [facility] for patients greater than or equal to 47 year old. It is not intended to diagnose infection nor to guide or monitor treatment.   Culture, blood (routine x 2)     Status: None (Preliminary result)   Collection Time: 12/24/14 12:26 PM  Result Value Ref Range Status   Specimen Description BLOOD LEFT HAND  Final   Special Requests IN PEDIATRIC BOTTLE  1CC  Final   Culture NO GROWTH 4 DAYS  Final   Report Status PENDING  Incomplete  Culture, blood (routine x 2)     Status: None (Preliminary result)   Collection Time: 12/24/14 12:40 PM  Result Value Ref Range Status   Specimen Description BLOOD RIGHT HAND  Final   Special Requests BOTTLES DRAWN AEROBIC AND ANAEROBIC 5CC  Final   Culture NO GROWTH 4 DAYS  Final   Report Status PENDING  Incomplete  Culture, blood (routine x 2)     Status: None (Preliminary result)   Collection Time: 12/25/14  1:20 PM  Result Value Ref Range Status   Specimen Description BLOOD HEMODIALYSIS CATHETER  Final   Special Requests BOTTLES DRAWN AEROBIC AND ANAEROBIC 10CC  Final   Culture NO GROWTH 3 DAYS  Final   Report Status PENDING  Incomplete  Culture, blood (routine x 2)     Status: None (Preliminary result)   Collection Time: 12/25/14  6:59 PM  Result Value Ref Range Status   Specimen Description BLOOD BLOOD RIGHT FOREARM  Final   Special Requests IN PEDIATRIC BOTTLE 1CC  Final   Culture NO GROWTH 3 DAYS  Final   Report Status PENDING  Incomplete    Coagulation Studies:  Recent Labs  12/27/14 0413 12/28/14 0453 12/29/14 0550  LABPROT 13.4 13.8 14.3  INR 1.00 1.04 1.09    Urinalysis:  Recent Labs  12/29/14 0726  COLORURINE YELLOW  LABSPEC 1.020  PHURINE 7.0  GLUCOSEU NEGATIVE  HGBUR MODERATE*   BILIRUBINUR NEGATIVE  KETONESUR NEGATIVE  PROTEINUR >300*  UROBILINOGEN 0.2  NITRITE POSITIVE*  LEUKOCYTESUR MODERATE*      Imaging: No results found.   Medications:   . sodium chloride 20 mL/hr (12/26/14 0915)   . sodium chloride   Intravenous Once  . sodium chloride   Intravenous Once  . amLODipine  10 mg Oral Daily  . aspirin EC  81 mg Oral Daily  . atorvastatin  20 mg Oral q1800  . calcium acetate  1,334 mg Oral TID WC  . cyclobenzaprine  10 mg Oral QHS  . [START ON 01/03/2015] darbepoetin (ARANESP) injection - DIALYSIS  100 mcg Intravenous Q Thu-HD  . feeding supplement (PRO-STAT SUGAR FREE 64)  30 mL Oral TID BM  . folic acid  1 mg Oral Daily  . hydrALAZINE  50 mg Oral 3 times per day  . hydroxychloroquine  200 mg Oral Daily  . isosorbide mononitrate  30 mg Oral Daily  . metoprolol succinate  100 mg Oral Daily  . multivitamin  1 tablet Oral QHS  . pantoprazole  20 mg Oral Daily  . [START ON 12/30/2014] predniSONE  10 mg Oral Q breakfast   Followed by  . [START ON 01/01/2015] predniSONE  5 mg Oral Q breakfast  . warfarin  4 mg Oral q1800  . Warfarin - Physician Dosing Inpatient   Does not apply q1800   acetaminophen, gelatin adsorbable, morphine injection, ondansetron, oxyCODONE-acetaminophen, sodium chloride  Assessment/ Plan:  1. 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 Currently on TThS schedule, has outpatient HD spot. Placement of left thigh graft postponed given recent stroke. . Will continue Prednisone 20 mg daily for today and then taper to 10 mg for 2 days and then 5 mg for 1 day. Will check UA to see if patient has UTI.  She was last dialysis 9/30 2. 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 Patient with residual left-sided extremity and facial weakness. Currently on  baby aspirin and atorvastatin. Lupus anticoagulant negative. TEE showed a vegetation on the mitral valve and likely to be source of emboli. Primary team working up for infectious endocarditis vs marantic endocarditis. Leaning towards marantic. Blood cultures negative so far. Currently on Vancomycin empirically. Rheumatology was consulted and based on that conversation warfarin was started. Plaquenil 200 mg daily also started by primary team on 9/28. Once INR 2.0-2.5 can discontinue ASA per neurology. INR 1.04 this AM. Parvovirus and Bartonella antibodies negative. Q fever antibodies, Brucella, and Legionella antibodies pending.  3. Hypertension: BP in 244W-102V systolic. Continue Toprol-XL, Norvasc, and Hydralazine.  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 PTH level 71. Will need to have PTH monitoring, check in 3 months.  5. Pancytopenia: Platelets continue to rise, 97,000 from 88,0000. Hgb 8.4 from 9.0 She just received 1 unit PRBCs on 9/28. Primary team giving another unit today. Leukopenia 1.6  Suspect pancytopenia from bone marrow suppression from Cellcept, although infectious endocarditis not completely ruled out yet. Cellcept discontinued on 9/26, tapering Prednisone. Continue Aranesp.    LOS: 101 Alecea Trego W @TODAY @1 :38 PM

## 2014-12-29 NOTE — Progress Notes (Signed)
PT Cancellation Note  Patient Details Name: [name] MRN: [id number] DOB: 07-21-1975   Cancelled Treatment:    Reason Eval/Treat Not Completed: Medical issues which prohibited therapy. Pt still has temporary femoral catheter and is unable to be OOB due to this. Please re-order when femoral cath is out.   Lj Miyamoto 12/29/2014, 11:56 AM

## 2014-12-29 NOTE — Progress Notes (Signed)
Subjective:   Day of hospitalization: 26  VSS.  No overnight events.  Pt is feeling fine but not much improved from initial presentation.  She is also c/o dysuria.     Objective:   Vital signs in last 24 hours: Filed Vitals:   12/28/14 1100 12/28/14 1338 12/28/14 2220 12/29/14 0519  BP: 142/84 136/73 131/74 129/75  Pulse: 100 103 92 93  Temp: 98.3 F (36.8 C) 99.3 F (37.4 C) 98.4 F (36.9 C) 98.8 F (37.1 C)  TempSrc: Oral Oral Oral Oral  Resp: 18 16 20 20   Height:      Weight:      SpO2: 100% 99% 97% 100%    Weight: Filed Weights   12/27/14 0500 12/27/14 0549 12/28/14 0703  Weight: 221 lb (100.245 kg) 219 lb (99.338 kg) 219 lb 9.3 oz (99.6 kg)    I/Os:  Intake/Output Summary (Last 24 hours) at 12/29/14 1259 Last data filed at 12/29/14 1207  Gross per 24 hour  Intake    500 ml  Output    300 ml  Net    200 ml    Physical Exam: Constitutional: Vital signs reviewed.  Patient is lying in bed in no acute distress and cooperative with exam.   HEENT: [place]/AT; EOMI, conjunctivae normal, no scleral icterus  Cardiovascular: RRR, no MRG Pulmonary/Chest: normal respiratory effort, no accessory muscle use, CTAB, no wheezes, rales, or rhonchi Abdominal: Soft. +BS, NT/ND Neurological: A&O x3, CN II-XII grossly intact; mild LUE weakness and normal LLE, RUE/RLE wnl  Extremities: 2+DP b/l,  trace pitting edema b/l Skin: Warm, dry and intact. No rash  Lab Results:  BMP:  Recent Labs Lab 12/28/14 0453 12/29/14 0550  NA 138 136  K 3.7 4.3  CL 103 100*  CO2 26 29  GLUCOSE 89 94  BUN 75* 43*  CREATININE 5.40* 3.70*  CALCIUM 8.1* 7.8*  PHOS 3.6 3.1    CBC:  Recent Labs Lab 12/26/14 0845  12/28/14 1656 12/28/14 2025 12/29/14 0550  WBC 1.9*  < >  --  1.7* 1.6*  NEUTROABS 1.7  --  0.8*  --   --   HGB 6.2*  < >  --  9.0* 8.4*  HCT 18.8*  < >  --  27.9* 25.4*  MCV 90.0  < >  --  91.5 90.4  PLT 73*  < >  --  94* 97*  < > = values in this interval not  displayed.  Coagulation:  Recent Labs Lab 12/27/14 0413 12/28/14 0453 12/29/14 0550  LABPROT 13.4 13.8 14.3  INR 1.00 1.04 1.09    CBG:           No results for input(s): GLUCAP in the last 168 hours.         HA1C:      No results for input(s): HGBA1C in the last 168 hours.  Lipid Panel: No results for input(s): CHOL, HDL, LDLCALC, TRIG, CHOLHDL, LDLDIRECT in the last 168 hours.  LFTs:  Recent Labs Lab 12/24/14 1226  12/26/14 0430  12/28/14 0453 12/29/14 0550  AST 24  --  23  --   --   --   ALT 9*  --  9*  --   --   --   ALKPHOS 53  --  53  --   --   --   BILITOT 0.6  --  0.6  --   --   --   PROT 4.6*  --  5.1*  --   --   --  ALBUMIN 2.2*  < > 2.0*  < > 2.3* 2.2*  < > = values in this interval not displayed.  Pancreatic Enzymes: No results for input(s): LIPASE, AMYLASE in the last 168 hours.  Lactic Acid/Procalcitonin: No results for input(s): LATICACIDVEN, PROCALCITON in the last 168 hours.  Ammonia: No results for input(s): AMMONIA in the last 168 hours.  Cardiac Enzymes: No results for input(s): CKTOTAL, CKMB, CKMBINDEX, TROPONINI in the last 168 hours.  EKG: EKG Interpretation  Date/Time:    Ventricular Rate:    PR Interval:    QRS Duration:   QT Interval:    QTC Calculation:   R Axis:     Text Interpretation:     BNP: No results for input(s): PROBNP in the last 168 hours.  D-Dimer: No results for input(s): DDIMER in the last 168 hours.  Urinalysis:  Recent Labs Lab 12/29/14 0726  COLORURINE YELLOW  LABSPEC 1.020  PHURINE 7.0  GLUCOSEU NEGATIVE  HGBUR MODERATE*  BILIRUBINUR NEGATIVE  KETONESUR NEGATIVE  PROTEINUR >300*  UROBILINOGEN 0.2  NITRITE POSITIVE*  LEUKOCYTESUR MODERATE*    Micro Results: Recent Results (from the past 240 hour(s))  Culture, blood (routine x 2)     Status: None (Preliminary result)   Collection Time: 12/24/14 12:26 PM  Result Value Ref Range Status   Specimen Description BLOOD LEFT HAND  Final    Special Requests IN PEDIATRIC BOTTLE  [place]  Final   Culture NO GROWTH 4 DAYS  Final   Report Status PENDING  Incomplete  Culture, blood (routine x 2)     Status: None (Preliminary result)   Collection Time: 12/24/14 12:40 PM  Result Value Ref Range Status   Specimen Description BLOOD RIGHT HAND  Final   Special Requests BOTTLES DRAWN AEROBIC AND ANAEROBIC 5CC  Final   Culture NO GROWTH 4 DAYS  Final   Report Status PENDING  Incomplete  Culture, blood (routine x 2)     Status: None (Preliminary result)   Collection Time: 12/25/14  1:20 PM  Result Value Ref Range Status   Specimen Description BLOOD HEMODIALYSIS CATHETER  Final   Special Requests BOTTLES DRAWN AEROBIC AND ANAEROBIC 10CC  Final   Culture NO GROWTH 3 DAYS  Final   Report Status PENDING  Incomplete  Culture, blood (routine x 2)     Status: None (Preliminary result)   Collection Time: 12/25/14  6:59 PM  Result Value Ref Range Status   Specimen Description BLOOD BLOOD RIGHT FOREARM  Final   Special Requests IN PEDIATRIC BOTTLE [place]  Final   Culture NO GROWTH 3 DAYS  Final   Report Status PENDING  Incomplete    Blood Culture:    Component Value Date/Time   SDES BLOOD BLOOD RIGHT FOREARM 12/25/2014 1859   SPECREQUEST IN PEDIATRIC BOTTLE [place] 12/25/2014 1859   CULT NO GROWTH 3 DAYS 12/25/2014 1859   REPTSTATUS PENDING 12/25/2014 1859    Studies/Results: No results found.  Medications:  Scheduled Meds: . sodium chloride   Intravenous Once  . sodium chloride   Intravenous Once  . amLODipine  10 mg Oral Daily  . aspirin EC  81 mg Oral Daily  . atorvastatin  20 mg Oral q1800  . calcium acetate  1,334 mg Oral TID WC  . cyclobenzaprine  10 mg Oral QHS  . [START ON 01/03/2015] darbepoetin (ARANESP) injection - DIALYSIS  100 mcg Intravenous Q Thu-HD  . feeding supplement (PRO-STAT SUGAR FREE 64)  30 mL Oral TID BM  .  folic acid  1 mg Oral Daily  . hydrALAZINE  50 mg Oral 3 times per day  . hydroxychloroquine  200 mg Oral  Daily  . isosorbide mononitrate  30 mg Oral Daily  . metoprolol succinate  100 mg Oral Daily  . multivitamin  1 tablet Oral QHS  . pantoprazole  20 mg Oral Daily  . [START ON 12/30/2014] predniSONE  10 mg Oral Q breakfast   Followed by  . [START ON 01/01/2015] predniSONE  5 mg Oral Q breakfast  . warfarin  4 mg Oral q1800  . Warfarin - Physician Dosing Inpatient   Does not apply q1800   Continuous Infusions: . sodium chloride 20 mL/hr (12/26/14 0915)   PRN Meds: acetaminophen, gelatin adsorbable, morphine injection, ondansetron, oxyCODONE-acetaminophen, sodium chloride  Antibiotics: Antibiotics Given (last 72 hours)    Date/Time Action Medication Dose Rate   12/26/14 2124 Given   hydroxychloroquine (PLAQUENIL) tablet 200 mg 200 mg    12/27/14 1003 Given   hydroxychloroquine (PLAQUENIL) tablet 200 mg 200 mg    12/28/14 1212 Given   hydroxychloroquine (PLAQUENIL) tablet 200 mg 200 mg    12/28/14 1227 Given   vancomycin (VANCOCIN) IVPB 1000 mg/200 mL premix 1,000 mg 200 mL/hr   12/29/14 1004 Given   hydroxychloroquine (PLAQUENIL) tablet 200 mg 200 mg       Day of Hospitalization: 26  Consults: Treatment Team:  Serafina Mitchell, MD Jamal Maes, MD Annia Belt, MD  Assessment/Plan:   Principal Problem:   ESRD (end stage renal disease) on dialysis Active Problems:   Hypertension   Lupus nephritis   AKI (acute kidney injury)   UTI (lower urinary tract infection)   Hematuria   Retroperitoneal hematoma   Hyperkalemia   Bleeding   Steal syndrome as complication of dialysis access   Left-sided weakness   Thrombocytopenia   Pressure ulcer   CVA (cerebral infarction)   Other pancytopenia   Anemia, chronic renal failure   Endocarditis of mitral valve   Libman Sacks endocarditis   Anemia due to bone marrow failure (HCC)  Lupus nephritis with ESRD  -cont current mgmt per nephrology  Likely libman sacks endocarditis -cont vancomycin for now and discuss with  ID on Monday   Pancytopenia Improving off cellcept, received 1 unit prbcs yesterday.  Labs stable.  -cont to monitor cbc -appreciate Dr. Azucena Freed recommendations   CVA Pt had CVA while inpatient with mild left sided weakness.  Thought to be embolic.  -cont coumadin 4mg  tonight and ASA (d/c when INR therapeutic)  -PT consult and would get pt up in chair today as she will become deconditioned   HTN -cont current meds  UTI UA suggestive of UTI and pt c/o dysuria. -will send for culture, on vancomycin   F/E/N Fluids- None  Electrolytes- Replete as needed  Nutrition- Regular  Disposition Disposition is deferred, awaiting improvement of current medical problems.  Anticipated discharge in approximately 1-2 day(s).     LOS: 26 days   Jones Bales, MD PGY-3, Internal Medicine Teaching Service 12/29/2014, 12:59 PM

## 2014-12-30 LAB — CULTURE, BLOOD (ROUTINE X 2)
CULTURE: NO GROWTH
CULTURE: NO GROWTH

## 2014-12-30 LAB — CBC WITH DIFFERENTIAL/PLATELET
Basophils Absolute: 0 10*3/uL (ref 0.0–0.1)
Basophils Relative: 1 %
EOS PCT: 1 %
Eosinophils Absolute: 0 10*3/uL (ref 0.0–0.7)
HEMATOCRIT: 25.8 % — AB (ref 36.0–46.0)
Hemoglobin: 8.4 g/dL — ABNORMAL LOW (ref 12.0–15.0)
LYMPHS ABS: 0.4 10*3/uL — AB (ref 0.7–4.0)
LYMPHS PCT: 21 %
MCH: 29.8 pg (ref 26.0–34.0)
MCHC: 32.6 g/dL (ref 30.0–36.0)
MCV: 91.5 fL (ref 78.0–100.0)
MONO ABS: 0.2 10*3/uL (ref 0.1–1.0)
Monocytes Relative: 10 %
Neutro Abs: 1.1 10*3/uL — ABNORMAL LOW (ref 1.7–7.7)
Neutrophils Relative %: 68 %
PLATELETS: 110 10*3/uL — AB (ref 150–400)
RBC: 2.82 MIL/uL — AB (ref 3.87–5.11)
RDW: 15.4 % (ref 11.5–15.5)
WBC: 1.7 10*3/uL — ABNORMAL LOW (ref 4.0–10.5)

## 2014-12-30 LAB — RENAL FUNCTION PANEL
Albumin: 2.3 g/dL — ABNORMAL LOW (ref 3.5–5.0)
Anion gap: 9 (ref 5–15)
BUN: 62 mg/dL — ABNORMAL HIGH (ref 6–20)
CHLORIDE: 102 mmol/L (ref 101–111)
CO2: 27 mmol/L (ref 22–32)
CREATININE: 4.57 mg/dL — AB (ref 0.44–1.00)
Calcium: 8.2 mg/dL — ABNORMAL LOW (ref 8.9–10.3)
GFR calc non Af Amer: 11 mL/min — ABNORMAL LOW (ref >60)
GFR, EST AFRICAN AMERICAN: 13 mL/min — AB (ref >60)
Glucose, Bld: 90 mg/dL (ref 65–99)
POTASSIUM: 4.3 mmol/L (ref 3.5–5.1)
Phosphorus: 3.4 mg/dL (ref 2.5–4.6)
Sodium: 138 mmol/L (ref 135–145)

## 2014-12-30 LAB — PROTIME-INR
INR: 1.19 (ref 0.00–1.49)
Prothrombin Time: 15.3 seconds — ABNORMAL HIGH (ref 11.6–15.2)

## 2014-12-30 LAB — Q FEVER ABS IGG, IGM W/ REFLEX TITER
Q FEVER IGG PHASE I SCREEN 1: NEGATIVE
Q FEVER IGG PHASE II SCREEN 1: NEGATIVE
Q FEVER IGM PHASE I SCREEN 1: NEGATIVE
Q FEVER IGM PHASE II SCREEN 1: NEGATIVE

## 2014-12-30 MED ORDER — WARFARIN SODIUM 5 MG PO TABS
5.0000 mg | ORAL_TABLET | Freq: Every day | ORAL | Status: DC
  Administered 2014-12-30: 5 mg via ORAL
  Filled 2014-12-30: qty 1

## 2014-12-30 MED ORDER — STROKE: EARLY STAGES OF RECOVERY BOOK
Freq: Once | Status: DC
  Filled 2014-12-30: qty 1

## 2014-12-30 NOTE — Care Management Note (Signed)
Case Management Note  Patient Details  Name: [name] MRN: [id number] Date of Birth: 06-Feb-1976  Subjective/Objective:                  1. Lupus related glomerulonephritis  2. Pancytopenia  3. 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   Action/Plan: CM called and spoke to patient who states that she would like to go home with [facility] services. PT/OT eval pending. CM offered pt choice for [facility] and [facility]. Pt said that she would like a HH RN to help as she has a portacath that may need blood draws. Cm will follow for further discharge planning needs and will call [facility] once orders and recommendations received. Pt states that her mother will be at home 24/7 to assist with care and her two children also live with her. Pt says that she has no trouble affording medications. Pt anticipates d/c home tomorrow or Tuesday. No further discharge planning needs at this time until evals completed.   Expected Discharge Date:  12/30/14              Expected Discharge Plan:  [place]  In-House Referral:     Discharge planning Services  CM Consult  Post Acute Care Choice:  Home Health, Durable Medical Equipment Choice offered to:  Patient  DME Arranged:    DME Agency:     HH Arranged:    [place] Agency:     Status of Service:  In process, will continue to follow  Medicare Important Message Given:    Date Medicare IM Given:    Medicare IM give by:    Date Additional Medicare IM Given:    Additional Medicare Important Message give by:     If discussed at [place] of Stay Meetings, dates discussed:    Additional Comments:  Guido Sander, RN 12/30/2014, 5:15 PM

## 2014-12-30 NOTE — Social Work (Signed)
CSW met with patient in order to assess discharge plan. Patient stated that if there are any skilled needs to be addressed she would like to pursue them through [facility] and does not want to go to a SNF because she has 2 children at home in the care of her mother. Though patient's mother is supportive, patient desires to be home and resume some of the duties of caring for and supporting her family. CSW will consult with RNCM about HH supports/services upon discharge.   No further CSW needs.  Christene Lye MSW, [place]

## 2014-12-30 NOTE — Progress Notes (Signed)
Subjective:   Day of hospitalization: 27  VSS.  No overnight events.  Pt is feeling better today with less fatigue.    Objective:   Vital signs in last 24 hours: Filed Vitals:   12/29/14 2154 12/30/14 0524 12/30/14 0526 12/30/14 0950  BP: 124/71  147/80 124/78  Pulse: 102  87   Temp: 98 F (36.7 C)  98.3 F (36.8 C)   TempSrc: Oral  Oral   Resp: 18  18   Height:      Weight:  210 lb (95.255 kg)    SpO2: 97%  98%     Weight: Filed Weights   12/27/14 0549 12/28/14 0703 12/30/14 0524  Weight: 219 lb (99.338 kg) 219 lb 9.3 oz (99.6 kg) 210 lb (95.255 kg)    I/Os:  Intake/Output Summary (Last 24 hours) at 12/30/14 1038 Last data filed at 12/30/14 0849  Gross per 24 hour  Intake    340 ml  Output    300 ml  Net     40 ml    Physical Exam: Constitutional: Vital signs reviewed.  Patient is lying in bed in no acute distress and cooperative with exam.   HEENT: [place]/AT; EOMI, conjunctivae normal, no scleral icterus  Cardiovascular: RRR, no MRG Pulmonary/Chest: normal respiratory effort, no accessory muscle use, CTAB, no wheezes, rales, or rhonchi Abdominal: Soft. +BS, NT/ND Neurological: A&O x3, CN II-XII grossly intact; mild LUE weakness and normal LLE, RUE/RLE wnl  Extremities: 2+DP b/l,  no lower extremity edema b/l Skin: Warm, dry and intact. No rash  Lab Results:  BMP:  Recent Labs Lab 12/29/14 0550 12/30/14 0510  NA 136 138  K 4.3 4.3  CL 100* 102  CO2 29 27  GLUCOSE 94 90  BUN 43* 62*  CREATININE 3.70* 4.57*  CALCIUM 7.8* 8.2*  PHOS 3.1 3.4    CBC:  Recent Labs Lab 12/28/14 1656  12/29/14 0550 12/30/14 0510  WBC  --   < > 1.6* 1.7*  NEUTROABS 0.8*  --   --  1.1*  HGB  --   < > 8.4* 8.4*  HCT  --   < > 25.4* 25.8*  MCV  --   < > 90.4 91.5  PLT  --   < > 97* 110*  < > = values in this interval not displayed.  Coagulation:  Recent Labs Lab 12/27/14 0413 12/28/14 0453 12/29/14 0550 12/30/14 0510  LABPROT 13.4 13.8 14.3 15.3*  INR  1.00 1.04 1.09 1.19    CBG:           No results for input(s): GLUCAP in the last 168 hours.         HA1C:      No results for input(s): HGBA1C in the last 168 hours.  Lipid Panel: No results for input(s): CHOL, HDL, LDLCALC, TRIG, CHOLHDL, LDLDIRECT in the last 168 hours.  LFTs:  Recent Labs Lab 12/24/14 1226  12/26/14 0430  12/29/14 0550 12/30/14 0510  AST 24  --  23  --   --   --   ALT 9*  --  9*  --   --   --   ALKPHOS 53  --  53  --   --   --   BILITOT 0.6  --  0.6  --   --   --   PROT 4.6*  --  5.1*  --   --   --   ALBUMIN 2.2*  < > 2.0*  < >  2.2* 2.3*  < > = values in this interval not displayed.  Pancreatic Enzymes: No results for input(s): LIPASE, AMYLASE in the last 168 hours.  Lactic Acid/Procalcitonin: No results for input(s): LATICACIDVEN, PROCALCITON in the last 168 hours.  Ammonia: No results for input(s): AMMONIA in the last 168 hours.  Cardiac Enzymes: No results for input(s): CKTOTAL, CKMB, CKMBINDEX, TROPONINI in the last 168 hours.  EKG: EKG Interpretation  Date/Time:    Ventricular Rate:    PR Interval:    QRS Duration:   QT Interval:    QTC Calculation:   R Axis:     Text Interpretation:     BNP: No results for input(s): PROBNP in the last 168 hours.  D-Dimer: No results for input(s): DDIMER in the last 168 hours.  Urinalysis:  Recent Labs Lab 12/29/14 0726  COLORURINE YELLOW  LABSPEC 1.020  PHURINE 7.0  GLUCOSEU NEGATIVE  HGBUR MODERATE*  BILIRUBINUR NEGATIVE  KETONESUR NEGATIVE  PROTEINUR >300*  UROBILINOGEN 0.2  NITRITE POSITIVE*  LEUKOCYTESUR MODERATE*    Micro Results: Recent Results (from the past 240 hour(s))  Culture, blood (routine x 2)     Status: None   Collection Time: 12/24/14 12:26 PM  Result Value Ref Range Status   Specimen Description BLOOD LEFT HAND  Final   Special Requests IN PEDIATRIC BOTTLE  [place]  Final   Culture NO GROWTH 5 DAYS  Final   Report Status 12/29/2014 FINAL  Final  Culture,  blood (routine x 2)     Status: None   Collection Time: 12/24/14 12:40 PM  Result Value Ref Range Status   Specimen Description BLOOD RIGHT HAND  Final   Special Requests BOTTLES DRAWN AEROBIC AND ANAEROBIC 5CC  Final   Culture NO GROWTH 5 DAYS  Final   Report Status 12/29/2014 FINAL  Final  Culture, blood (routine x 2)     Status: None (Preliminary result)   Collection Time: 12/25/14  1:20 PM  Result Value Ref Range Status   Specimen Description BLOOD HEMODIALYSIS CATHETER  Final   Special Requests BOTTLES DRAWN AEROBIC AND ANAEROBIC 10CC  Final   Culture NO GROWTH 4 DAYS  Final   Report Status PENDING  Incomplete  Culture, blood (routine x 2)     Status: None (Preliminary result)   Collection Time: 12/25/14  6:59 PM  Result Value Ref Range Status   Specimen Description BLOOD BLOOD RIGHT FOREARM  Final   Special Requests IN PEDIATRIC BOTTLE 1CC  Final   Culture NO GROWTH 4 DAYS  Final   Report Status PENDING  Incomplete    Blood Culture:    Component Value Date/Time   SDES BLOOD BLOOD RIGHT FOREARM 12/25/2014 1859   SPECREQUEST IN PEDIATRIC BOTTLE [place] 12/25/2014 1859   CULT NO GROWTH 4 DAYS 12/25/2014 1859   REPTSTATUS PENDING 12/25/2014 1859    Studies/Results: No results found.  Medications:  Scheduled Meds: . sodium chloride   Intravenous Once  . sodium chloride   Intravenous Once  . amLODipine  10 mg Oral Daily  . aspirin EC  81 mg Oral Daily  . atorvastatin  20 mg Oral q1800  . calcium acetate  1,334 mg Oral TID WC  .  ceFAZolin (ANCEF) IV  1 g Intravenous Q24H  . cyclobenzaprine  10 mg Oral QHS  . [START ON 01/03/2015] darbepoetin (ARANESP) injection - DIALYSIS  100 mcg Intravenous Q Thu-HD  . feeding supplement (PRO-STAT SUGAR FREE 64)  30 mL Oral TID BM  .  folic acid  1 mg Oral Daily  . hydrALAZINE  50 mg Oral 3 times per day  . hydroxychloroquine  200 mg Oral Daily  . isosorbide mononitrate  30 mg Oral Daily  . metoprolol succinate  100 mg Oral Daily  .  multivitamin  1 tablet Oral QHS  . pantoprazole  20 mg Oral Daily  . predniSONE  10 mg Oral Q breakfast   Followed by  . [START ON 01/01/2015] predniSONE  5 mg Oral Q breakfast  . warfarin  4 mg Oral q1800  . Warfarin - Physician Dosing Inpatient   Does not apply q1800   Continuous Infusions: . sodium chloride 20 mL/hr (12/26/14 0915)   PRN Meds: acetaminophen, gelatin adsorbable, morphine injection, ondansetron, oxyCODONE-acetaminophen, sodium chloride  Antibiotics: Antibiotics Given (last 72 hours)    Date/Time Action Medication Dose Rate   12/28/14 1212 Given   hydroxychloroquine (PLAQUENIL) tablet 200 mg 200 mg    12/28/14 1227 Given   vancomycin (VANCOCIN) IVPB 1000 mg/200 mL premix 1,000 mg 200 mL/hr   12/29/14 1004 Given   hydroxychloroquine (PLAQUENIL) tablet 200 mg 200 mg    12/29/14 2028 Given   ceFAZolin (ANCEF) IVPB 1 g/50 mL premix 1 g 100 mL/hr   12/30/14 0950 Given   hydroxychloroquine (PLAQUENIL) tablet 200 mg 200 mg       Day of Hospitalization: 27  Consults: Treatment Team:  Serafina Mitchell, MD Jamal Maes, MD Annia Belt, MD  Assessment/Plan:   Principal Problem:   ESRD (end stage renal disease) on dialysis [facility]) Active Problems:   Hypertension   Lupus nephritis ([place])   AKI (acute kidney injury) ([place])   UTI (lower urinary tract infection)   Hematuria   Retroperitoneal hematoma   Hyperkalemia   Bleeding   Steal syndrome as complication of dialysis access (HCC)   Left-sided weakness   Thrombocytopenia (HCC)   Pressure ulcer   CVA (cerebral infarction)   Other pancytopenia ([place])   Anemia, chronic renal failure   Endocarditis of mitral valve   Libman Sacks endocarditis ([place])   Anemia due to bone marrow failure ([place])  Lupus nephritis with ESRD  -cont current mgmt per nephrology -cont current meds   Likely libman sacks endocarditis -d/c vancomycin on 10/1 and begin ancef on 10/1    Pancytopenia Improving off cellcept,  received 1 unit prbcs on 9/30.  Labs stable.  -cont to monitor cbc -appreciate Dr. Azucena Freed recommendations   CVA Pt had CVA while inpatient with mild left sided weakness.  Thought to be embolic.  INR 1.19 today.  -increase coumadin today to 5mg  this evening and cont ASA (d/c when INR therapeutic)  -PT consult when able   HTN -cont current meds  UTI UA suggestive of UTI and pt c/o dysuria. -urine cx in process, cont ancef   F/E/N Fluids- None  Electrolytes- Replete as needed  Nutrition- Regular  Disposition Disposition is deferred, awaiting improvement of current medical problems.  Anticipated discharge in approximately 1-2 day(s).     LOS: 27 days   Jones Bales, MD PGY-3, Internal Medicine Teaching Service 12/30/2014, 10:38 AM

## 2014-12-30 NOTE — Progress Notes (Signed)
[facility] ROUNDING NOTE   Subjective:   Interval History:  No complaints this am  Objective:  Vital signs in last 24 hours:  Temp:  [98 F (36.7 C)-98.3 F (36.8 C)] 98.3 F (36.8 C) (10/02 0526) Pulse Rate:  [87-102] 87 (10/02 0526) Resp:  [18-20] 18 (10/02 0526) BP: (124-147)/(67-80) 124/78 mmHg (10/02 0950) SpO2:  [97 %-98 %] 98 % (10/02 0526) Weight:  [95.255 kg (210 lb)] 95.255 kg (210 lb) (10/02 0524)  Weight change: -4.345 kg (-9 lb 9.3 oz) Filed Weights   12/27/14 0549 12/28/14 0703 12/30/14 0524  Weight: 99.338 kg (219 lb) 99.6 kg (219 lb 9.3 oz) 95.255 kg (210 lb)    Intake/Output: I/O last 3 completed shifts: In: 23 [P.O.:760; I.V.:20] Out: 600 [Urine:600]   Intake/Output this shift:  Total I/O In: 120 [P.O.:120] Out: -   Gen: alert, resting in HD bed, pleasant, NAD  CVS: RRR, no m/g/r Resp: CTA bilaterally, breaths non-labored Abd: BS+, soft, obese, non-tender Ext: No LE edema. Right femoral tunneled dialysis catheter without bleeding Neuro: alert and oriented x 4, 3/5 strength in LUE, 4/5 LLE, 5/5 in RUE and RLE. Slight left facial droop present.     Basic Metabolic Panel:  Recent Labs Lab 12/25/14 0440 12/26/14 0430 12/27/14 0413 12/28/14 0453 12/29/14 0550 12/30/14 0510  NA 132* 133* 137 138 136 138  K 4.3 3.6 3.8 3.7 4.3 4.3  CL 96* 97* 101 103 100* 102  CO2 23 26 26 26 29 27   GLUCOSE 109* 175* 136* 89 94 90  BUN 60* 39* 61* 75* 43* 62*  CREATININE 5.03* 3.71* 4.71* 5.40* 3.70* 4.57*  CALCIUM 8.3* 7.7* 8.2* 8.1* 7.8* 8.2*  PHOS 6.6*  --  3.8 3.6 3.1 3.4    Liver Function Tests:  Recent Labs Lab 12/24/14 1226  12/26/14 0430 12/27/14 0413 12/28/14 0453 12/29/14 0550 12/30/14 0510  AST 24  --  23  --   --   --   --   ALT 9*  --  9*  --   --   --   --   ALKPHOS 53  --  53  --   --   --   --   BILITOT 0.6  --  0.6  --   --   --   --   PROT 4.6*  --  5.1*  --   --   --   --   ALBUMIN 2.2*  < > 2.0* 2.2* 2.3*  2.2* 2.3*  < > = values in this interval not displayed. No results for input(s): LIPASE, AMYLASE in the last 168 hours. No results for input(s): AMMONIA in the last 168 hours.  CBC:  Recent Labs Lab 12/24/14 1110  12/25/14 1846 12/26/14 0845  12/27/14 0413 12/28/14 0453 12/28/14 1656 12/28/14 2025 12/29/14 0550 12/30/14 0510  WBC 1.7*  < >  --  1.9*  --  1.8* 1.3*  --  1.7* 1.6* 1.7*  NEUTROABS 1.2*  --  2.5 1.7  --   --   --  0.8*  --   --  1.1*  HGB 7.5*  < >  --  6.2*  < > 8.5* 7.0*  --  9.0* 8.4* 8.4*  HCT 22.6*  < >  --  18.8*  < > 25.7* 21.5*  --  27.9* 25.4* 25.8*  MCV 90.0  < >  --  90.0  --  89.9 90.7  --  91.5 90.4 91.5  PLT 56*  < >  --  73*  --  88* 99*  --  94* 97* 110*  < > = values in this interval not displayed.  Cardiac Enzymes: No results for input(s): CKTOTAL, CKMB, CKMBINDEX, TROPONINI in the last 168 hours.  BNP: Invalid input(s): POCBNP  CBG: No results for input(s): GLUCAP in the last 168 hours.  Microbiology: Results for orders placed or performed during the hospital encounter of 12/03/14  Urine culture     Status: None   Collection Time: 12/03/14  1:44 PM  Result Value Ref Range Status   Specimen Description URINE, CLEAN CATCH  Final   Special Requests Normal  Final   Culture >=100,000 COLONIES/mL KLEBSIELLA PNEUMONIAE  Final   Report Status 12/06/2014 FINAL  Final   Organism ID, Bacteria KLEBSIELLA PNEUMONIAE  Final      Susceptibility   Klebsiella pneumoniae - MIC*    AMPICILLIN 4 RESISTANT Resistant     CEFAZOLIN <=4 SENSITIVE Sensitive     CEFTRIAXONE <=1 SENSITIVE Sensitive     CIPROFLOXACIN <=0.25 SENSITIVE Sensitive     GENTAMICIN <=1 SENSITIVE Sensitive     IMIPENEM <=0.25 SENSITIVE Sensitive     NITROFURANTOIN 64 INTERMEDIATE Intermediate     TRIMETH/SULFA <=20 SENSITIVE Sensitive     AMPICILLIN/SULBACTAM 4 SENSITIVE Sensitive     PIP/TAZO 8 SENSITIVE Sensitive     * >=100,000 COLONIES/mL KLEBSIELLA PNEUMONIAE  Surgical pcr  screen     Status: None   Collection Time: 12/18/14  7:56 AM  Result Value Ref Range Status   MRSA, PCR NEGATIVE NEGATIVE Final   Staphylococcus aureus NEGATIVE NEGATIVE Final    Comment:        The Xpert SA Assay (FDA approved for NASAL specimens in patients over 26 years of age), is one component of a comprehensive surveillance program.  Test performance has been validated by [facility] for patients greater than or equal to 51 year old. It is not intended to diagnose infection nor to guide or monitor treatment.   Culture, blood (routine x 2)     Status: None   Collection Time: 12/24/14 12:26 PM  Result Value Ref Range Status   Specimen Description BLOOD LEFT HAND  Final   Special Requests IN PEDIATRIC BOTTLE  [place]  Final   Culture NO GROWTH 5 DAYS  Final   Report Status 12/29/2014 FINAL  Final  Culture, blood (routine x 2)     Status: None   Collection Time: 12/24/14 12:40 PM  Result Value Ref Range Status   Specimen Description BLOOD RIGHT HAND  Final   Special Requests BOTTLES DRAWN AEROBIC AND ANAEROBIC 5CC  Final   Culture NO GROWTH 5 DAYS  Final   Report Status 12/29/2014 FINAL  Final  Culture, blood (routine x 2)     Status: None (Preliminary result)   Collection Time: 12/25/14  1:20 PM  Result Value Ref Range Status   Specimen Description BLOOD HEMODIALYSIS CATHETER  Final   Special Requests BOTTLES DRAWN AEROBIC AND ANAEROBIC 10CC  Final   Culture NO GROWTH 4 DAYS  Final   Report Status PENDING  Incomplete  Culture, blood (routine x 2)     Status: None (Preliminary result)   Collection Time: 12/25/14  6:59 PM  Result Value Ref Range Status   Specimen Description BLOOD BLOOD RIGHT FOREARM  Final   Special Requests IN PEDIATRIC BOTTLE 1CC  Final   Culture NO GROWTH 4 DAYS  Final   Report Status PENDING  Incomplete    Coagulation Studies:  Recent Labs  12/28/14 0453 12/29/14 0550 12/30/14 0510  LABPROT 13.8 14.3 15.3*  INR 1.04 1.09 1.19     Urinalysis:  Recent Labs  12/29/14 0726  COLORURINE YELLOW  LABSPEC 1.020  PHURINE 7.0  GLUCOSEU NEGATIVE  HGBUR MODERATE*  BILIRUBINUR NEGATIVE  KETONESUR NEGATIVE  PROTEINUR >300*  UROBILINOGEN 0.2  NITRITE POSITIVE*  LEUKOCYTESUR MODERATE*      Imaging: No results found.   Medications:   . sodium chloride 20 mL/hr (12/26/14 0915)   . sodium chloride   Intravenous Once  . sodium chloride   Intravenous Once  . amLODipine  10 mg Oral Daily  . aspirin EC  81 mg Oral Daily  . atorvastatin  20 mg Oral q1800  . calcium acetate  1,334 mg Oral TID WC  .  ceFAZolin (ANCEF) IV  1 g Intravenous Q24H  . cyclobenzaprine  10 mg Oral QHS  . [START ON 01/03/2015] darbepoetin (ARANESP) injection - DIALYSIS  100 mcg Intravenous Q Thu-HD  . feeding supplement (PRO-STAT SUGAR FREE 64)  30 mL Oral TID BM  . folic acid  1 mg Oral Daily  . hydrALAZINE  50 mg Oral 3 times per day  . hydroxychloroquine  200 mg Oral Daily  . isosorbide mononitrate  30 mg Oral Daily  . metoprolol succinate  100 mg Oral Daily  . multivitamin  1 tablet Oral QHS  . pantoprazole  20 mg Oral Daily  . predniSONE  10 mg Oral Q breakfast   Followed by  . [START ON 01/01/2015] predniSONE  5 mg Oral Q breakfast  . warfarin  5 mg Oral q1800  . Warfarin - Physician Dosing Inpatient   Does not apply q1800   acetaminophen, gelatin adsorbable, morphine injection, ondansetron, oxyCODONE-acetaminophen, sodium chloride  Assessment/ Plan:  1. 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 Currently on TThS schedule, has outpatient HD spot. Placement of left thigh graft postponed given recent stroke. . Will continue Prednisone 20 mg daily for today and then taper to 10 mg for 2 days and then 5 mg for 1 day. Will check UA to see if patient has UTI. She was last  dialysis 9/30 2. 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 Patient with residual left-sided extremity and facial weakness. Currently on baby aspirin and atorvastatin. Lupus anticoagulant negative. TEE showed a vegetation on the mitral valve and likely to be source of emboli. Primary team working up for infectious endocarditis vs marantic endocarditis. Leaning towards marantic. Blood cultures negative so far. Currently on Vancomycin empirically. Rheumatology was consulted and based on that conversation warfarin was started. Plaquenil 200 mg daily also started by primary team on 9/28. Once INR 2.0-2.5 can discontinue ASA per neurology. INR 1.04 this AM. Parvovirus and Bartonella antibodies negative. Q fever antibodies, Brucella, and Legionella antibodies pending.  3. Hypertension: BP in 811B-147W systolic. Continue Toprol-XL, Norvasc, and Hydralazine.  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 PTH level 71. Will need to have PTH monitoring, check in 3 months.  5. Pancytopenia: Platelets continue to rise, 97,000 from 88,0000. Hgb 8.4 from 9.0 She just received 1 unit PRBCs on 9/28. Primary team giving another unit today. Leukopenia 1.6 Suspect pancytopenia from bone marrow suppression from Cellcept, although infectious endocarditis not completely ruled out yet. Cellcept discontinued on 9/26, tapering Prednisone. Continue Aranesp.  LOS: 47 Jasmon Graffam W @TODAY @10 :57 AM

## 2014-12-31 DIAGNOSIS — F329 Major depressive disorder, single episode, unspecified: Secondary | ICD-10-CM

## 2014-12-31 DIAGNOSIS — B965 Pseudomonas (aeruginosa) (mallei) (pseudomallei) as the cause of diseases classified elsewhere: Secondary | ICD-10-CM

## 2014-12-31 DIAGNOSIS — R509 Fever, unspecified: Secondary | ICD-10-CM

## 2014-12-31 LAB — RENAL FUNCTION PANEL
ALBUMIN: 2.2 g/dL — AB (ref 3.5–5.0)
Anion gap: 8 (ref 5–15)
BUN: 73 mg/dL — AB (ref 6–20)
CALCIUM: 8.3 mg/dL — AB (ref 8.9–10.3)
CO2: 26 mmol/L (ref 22–32)
CREATININE: 4.49 mg/dL — AB (ref 0.44–1.00)
Chloride: 103 mmol/L (ref 101–111)
GFR calc Af Amer: 13 mL/min — ABNORMAL LOW (ref >60)
GFR, EST NON AFRICAN AMERICAN: 11 mL/min — AB (ref >60)
GLUCOSE: 88 mg/dL (ref 65–99)
PHOSPHORUS: 3.6 mg/dL (ref 2.5–4.6)
Potassium: 4 mmol/L (ref 3.5–5.1)
SODIUM: 137 mmol/L (ref 135–145)

## 2014-12-31 LAB — CBC WITH DIFFERENTIAL/PLATELET
BASOS ABS: 0 10*3/uL (ref 0.0–0.1)
BASOS PCT: 0 %
EOS ABS: 0 10*3/uL (ref 0.0–0.7)
Eosinophils Relative: 1 %
HEMATOCRIT: 25.8 % — AB (ref 36.0–46.0)
Hemoglobin: 8.2 g/dL — ABNORMAL LOW (ref 12.0–15.0)
Lymphocytes Relative: 18 %
Lymphs Abs: 0.3 10*3/uL — ABNORMAL LOW (ref 0.7–4.0)
MCH: 29.7 pg (ref 26.0–34.0)
MCHC: 31.8 g/dL (ref 30.0–36.0)
MCV: 93.5 fL (ref 78.0–100.0)
MONO ABS: 0.2 10*3/uL (ref 0.1–1.0)
MONOS PCT: 11 %
NEUTROS ABS: 1.2 10*3/uL — AB (ref 1.7–7.7)
NEUTROS PCT: 70 %
Platelets: 138 10*3/uL — ABNORMAL LOW (ref 150–400)
RBC: 2.76 MIL/uL — ABNORMAL LOW (ref 3.87–5.11)
RDW: 15.6 % — AB (ref 11.5–15.5)
WBC: 1.7 10*3/uL — ABNORMAL LOW (ref 4.0–10.5)

## 2014-12-31 LAB — PROTIME-INR
INR: 1.32 (ref 0.00–1.49)
PROTHROMBIN TIME: 16.5 s — AB (ref 11.6–15.2)

## 2014-12-31 LAB — PATHOLOGIST SMEAR REVIEW

## 2014-12-31 MED ORDER — PRO-STAT SUGAR FREE PO LIQD
30.0000 mL | Freq: Every day | ORAL | Status: DC
  Administered 2015-01-02 – 2015-01-04 (×2): 30 mL via ORAL
  Filled 2014-12-31 (×5): qty 30

## 2014-12-31 MED ORDER — WARFARIN - PHYSICIAN DOSING INPATIENT
Freq: Every day | Status: DC
  Administered 2015-01-03: 18:00:00

## 2014-12-31 MED ORDER — WARFARIN SODIUM 5 MG PO TABS
5.0000 mg | ORAL_TABLET | Freq: Every day | ORAL | Status: DC
  Administered 2014-12-31 – 2015-01-01 (×2): 5 mg via ORAL
  Filled 2014-12-31 (×2): qty 1

## 2014-12-31 MED ORDER — METOPROLOL SUCCINATE ER 100 MG PO TB24
100.0000 mg | ORAL_TABLET | Freq: Every day | ORAL | Status: DC
  Administered 2014-12-31 – 2015-01-03 (×4): 100 mg via ORAL
  Filled 2014-12-31 (×4): qty 1

## 2014-12-31 MED ORDER — DEXTROSE 5 % IV SOLN
2.0000 g | Freq: Two times a day (BID) | INTRAVENOUS | Status: DC
  Administered 2014-12-31: 2 g via INTRAVENOUS
  Filled 2014-12-31 (×3): qty 2

## 2014-12-31 MED ORDER — AMLODIPINE BESYLATE 10 MG PO TABS
10.0000 mg | ORAL_TABLET | Freq: Every day | ORAL | Status: DC
  Administered 2014-12-31 – 2015-01-03 (×4): 10 mg via ORAL
  Filled 2014-12-31 (×4): qty 1

## 2014-12-31 MED ORDER — VANCOMYCIN HCL IN DEXTROSE 1-5 GM/200ML-% IV SOLN
1000.0000 mg | INTRAVENOUS | Status: DC
  Administered 2015-01-01 – 2015-01-05 (×3): 1000 mg via INTRAVENOUS
  Filled 2014-12-31 (×7): qty 200

## 2014-12-31 NOTE — Progress Notes (Signed)
Patient ID: [name], female   DOB: 10/19/1975, 39 y.o.   MRN: [id number]        Attending progress note    Date of Admission:  12/03/2014     Principal Problem:   ESRD (end stage renal disease) on dialysis 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  stroke: mapping our early stages of recovery book   Does not apply Once  . sodium chloride   Intravenous Once  . sodium chloride   Intravenous Once  . amLODipine  10 mg Oral QHS  . aspirin EC  81 mg Oral Daily  . atorvastatin  20 mg Oral q1800  . calcium acetate  1,334 mg Oral TID WC  .  ceFAZolin (ANCEF) IV  1 g Intravenous Q24H  . cyclobenzaprine  10 mg Oral QHS  . [START ON 01/03/2015] darbepoetin (ARANESP) injection - DIALYSIS  100 mcg Intravenous Q Thu-HD  . feeding supplement (PRO-STAT SUGAR FREE 64)  30 mL Oral TID BM  . folic acid  1 mg Oral Daily  . hydrALAZINE  50 mg Oral 3 times per day  . hydroxychloroquine  200 mg Oral Daily  . isosorbide mononitrate  30 mg Oral Daily  . metoprolol succinate  100 mg Oral QHS  . multivitamin  1 tablet Oral QHS  . pantoprazole  20 mg Oral Daily  . [START ON 01/01/2015] predniSONE  5 mg Oral Q breakfast    SUBJECTIVE: I saw and examined [name] with Dr. Posey Pronto and our medical team this morning. She's had a very complicated 28 day hospitalization prompted by lupus nephritis and progression to end-stage renal disease. She developed a retroperitoneal hematoma after recent kidney biopsy. He's also had  pancytopenia thought to be related to immunosuppressive therapy with CellCept.  She developed an acute right CVA on 12/21/2014. She was found to have a shaggy mitral valve vegetation. She received one week of empiric vancomycin for possible endocarditis complicated by septic CNS emboli. Blood cultures done on 12/24/2014 and 12/25/2014 were negative but she had received antibiotics 5 days previously for vascular surgery. It is possible that her blood cultures may have been falsely negative.  She's had difficulty with vascular access for hemodialysis. She developed subclavian steal after recent left arm graft placement and it had to be ligated. She now has a temporary right femoral hemodialysis catheter and has had some bleeding from the site. Fortunately this appears to have stopped. She will need a permanent left thigh graft.neurology has recommended waiting at least 2 weeks following her acute stroke before she undergoes general anesthesia. She will need to have her Coumadin stopped and have bridging with heparin before that procedure.  She now has low-grade fever to 100.8 and a Pseudomonas urinary tract infection.  She states that she is feeling progressively more depressed during this hospitalization.  She has not been out of bed much and has not been working with physical therapy.  Review of Systems: Pertinent  items are noted in HPI.  Past Medical History  Diagnosis Date  . Hypertension   . Cardiac arrhythmia   . SVT (supraventricular tachycardia)     "today, last week, 2 wk ago; maybe 1 month ago, etc; started w/in last 3-4 yrs"(07/20/2012)  . Fainting     "~ 1 month ago; probably related to SVT" (07/20/2012)  . Shortness of breath     "related to SVT episodes" (07/20/2012)  . Chest pain at rest     "related to SVT" (07/20/2012)  . Lupus (systemic lupus erythematosus)   . GERD (gastroesophageal reflux disease)   . Fibromyalgia   . Irritable bowel syndrome (IBS)   .  Hypercholesterolemia   . Heart murmur     "small" (12/25/2014)  . TIA (transient ischemic attack) 12/21/2014  . Sleep apnea     "don't wear mask anymore" (12/25/2014)  . Anemia   . History of blood transfusion "several"    "related to low counts"  . Daily headache   . Arthritis     "hips" (12/25/2014)  . Anxiety     "sometimes; don't take anything for it" (12/25/2014)  . ESRD (end stage renal disease) on dialysis since 12/07/2014    "I've been doing it here on [facility]" (12/25/2014)  . Chronic renal failure, stage 4 (severe)   . Renal insufficiency     Social History  Substance Use Topics  . Smoking status: Never Smoker   . Smokeless tobacco: Never Used  . Alcohol Use: No     Comment: 07/20/2012 "might have a beer once/yr"    Family History  Problem Relation Age of Onset  . Cancer Mother     breast and ovarian  . BRCA 1/2 Sister    Allergies  Allergen Reactions  . Food Swelling    Red peppers    OBJECTIVE: Filed Vitals:   12/30/14 2102 12/31/14 0518 12/31/14 0519 12/31/14 1339  BP: 144/74  131/72 145/74  Pulse: 96  87 103  Temp: 98.5 F (36.9 C)  98.2 F (36.8 C) 98.4 F (36.9 C)  TempSrc: Oral  Oral Oral  Resp: 18  18 18   Height:      Weight:  224 lb (101.606 kg)    SpO2: 100%  96% 99%   Body mass index is 39.69 kg/(m^2).  General: she is resting quietly in bed. She is in no distress. Her mother is at the bedside Skin: no rash Lungs: clear Cor: regular S1 and S2 with no murmurs Abdomen: soft and nontender There is some induration but no fluctuance or cellulitis around her right groin hemodialysis catheter  Lab Results Lab Results  Component Value Date   WBC 1.7* 12/31/2014   HGB 8.2* 12/31/2014   HCT 25.8* 12/31/2014   MCV 93.5 12/31/2014   PLT 138* 12/31/2014    Lab Results  Component Value Date   CREATININE 4.49* 12/31/2014   BUN 73* 12/31/2014   NA 137 12/31/2014   K 4.0 12/31/2014   CL 103 12/31/2014   CO2 26 12/31/2014    Lab Results    Component Value Date   ALT 9* 12/26/2014   AST 23 12/26/2014   ALKPHOS 53 12/26/2014   BILITOT 0.6 12/26/2014     Microbiology: Recent Results (from the past 240 hour(s))  Culture, blood (routine x 2)     Status: None   Collection Time: 12/24/14 12:26 PM  Result Value Ref Range Status   Specimen Description BLOOD LEFT HAND  Final  Special Requests IN PEDIATRIC BOTTLE  1CC  Final   Culture NO GROWTH 5 DAYS  Final   Report Status 12/29/2014 FINAL  Final  Culture, blood (routine x 2)     Status: None   Collection Time: 12/24/14 12:40 PM  Result Value Ref Range Status   Specimen Description BLOOD RIGHT HAND  Final   Special Requests BOTTLES DRAWN AEROBIC AND ANAEROBIC 5CC  Final   Culture NO GROWTH 5 DAYS  Final   Report Status 12/29/2014 FINAL  Final  Culture, blood (routine x 2)     Status: None   Collection Time: 12/25/14  1:20 PM  Result Value Ref Range Status   Specimen Description BLOOD HEMODIALYSIS CATHETER  Final   Special Requests BOTTLES DRAWN AEROBIC AND ANAEROBIC 10CC  Final   Culture NO GROWTH 5 DAYS  Final   Report Status 12/30/2014 FINAL  Final  Culture, blood (routine x 2)     Status: None   Collection Time: 12/25/14  6:59 PM  Result Value Ref Range Status   Specimen Description BLOOD BLOOD RIGHT FOREARM  Final   Special Requests IN PEDIATRIC BOTTLE 1CC  Final   Culture NO GROWTH 5 DAYS  Final   Report Status 12/30/2014 FINAL  Final  Urine culture     Status: None (Preliminary result)   Collection Time: 12/29/14  7:26 AM  Result Value Ref Range Status   Specimen Description URINE, RANDOM  Final   Special Requests NONE  Final   Culture >=100,000 COLONIES/mL PSEUDOMONAS AERUGINOSA  Final   Report Status PENDING  Incomplete     ASSESSMENT/PLAN: This is an extremely complicated situation. Several decisions will need to be made including:  1. Whether or not to continue empiric therapy for possible endocarditis complicated by septic CNS emboli. Her blood  cultures may have been falsely negative. I am in favor of continuing empiric vancomycin for 3 more weeks. 2. We will need to change her empiric therapy to give her better coverage for Pseudomonas UTI pending antibiotic susceptibilities. 3. We need to talk to vascular surgery about the timing of her left thigh graft placement in order to determine when Coumadin needs to be stopped. 4. We will continue to monitor her CBCs. Her cytopenias appear to be improving. 5. She has been started on plaque with no for her lupus. I would consider rechecking complement levels to gauge activity of her lupus. 6. We will talk to her further about management of her reactive depression. 7. She is very deconditioned and needs to start working with physical therapy.   Michel Bickers, MD [facility] [phone] pager   [phone] cell 12/31/2014, 3:08 PM

## 2014-12-31 NOTE — Progress Notes (Signed)
Assumed care of pt. Pt resting in bed watching TV. No apparent distress. Pt states no complaints.

## 2014-12-31 NOTE — Progress Notes (Signed)
   Subjective: Patient states she is feeling okay today, but admits that she feels depressed. She says she does not believe she was depressed prior to her lengthy hospital stay.  Objective: Vital signs in last 24 hours: Filed Vitals:   12/30/14 2100 12/30/14 2102 12/31/14 0518 12/31/14 0519  BP: 144/74 144/74  131/72  Pulse: 96 96  87  Temp:  98.5 F (36.9 C)  98.2 F (36.8 C)  TempSrc:  Oral  Oral  Resp:  18  18  Height:      Weight:   224 lb (101.606 kg)   SpO2:  100%  96%   Weight change: 14 lb (6.35 kg)  Intake/Output Summary (Last 24 hours) at 12/31/14 1312 Last data filed at 12/31/14 0855  Gross per 24 hour  Intake    362 ml  Output   1350 ml  Net   -988 ml   General: resting in bed Cardiac: RRR Pulm: clear to auscultation bilaterally Abd: soft, nontender Ext: warm and well perfused, no pedal edema Neuro: alert and oriented X3 Psych: depressed mood  Assessment/Plan: Principal Problem:   ESRD (end stage renal disease) on dialysis (HCC) Active Problems:   Hypertension   Lupus nephritis (HCC)   AKI (acute kidney injury) (HCC)   UTI (lower urinary tract infection)   Hematuria   Retroperitoneal hematoma   Hyperkalemia   Bleeding   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 Vascular surgery following, recommend discontinuing warfarin several days prior to permanent access placement. -f/u with Vascular on timing of access placement to discontinue coumadin -Continue current management per nephrology -Continue current medications -  Endocarditis of mitral valve: Possibly  Rudene Christians, but unable to rule out infectious endocarditis as patient is immunosuppressed and leukopenic. Blood cultures negative, however may be false as they were drawn after patient received antibiotics. Will feel more comfortable continuing empiric vancomycin for infectious endocarditis -Start Vancomycin per pharm dosing  UTI: Urine culture growing pseudomonas -Start Ceftazadime IV 2g every 12 hours  Pancytopenia: Improving off of cellcept, received 1 unit PRBCs on 9/30. Labs stable. -Monitor CBC  CVA: Occurred during inpatient stay, likely embolic. INR 1.32 this morning. -Continue Coumadin 5 mg, will need to hold several days prior to permanent access placement and bridge -PT unable to work with patient out of bed while temporary femoral catheter is in place per Nephrology. Appreciate further rec's.  HTN: Continue current meds    Dispo: Disposition is deferred at this time, awaiting improvement of current medical problems.      .Services Needed at time of discharge: Y = Yes, Blank = No PT:   OT:   RN:   Equipment:   Other:     LOS: 28 days   Zada Finders, MD 12/31/2014, 1:12 PM

## 2014-12-31 NOTE — Progress Notes (Signed)
ANTIBIOTIC CONSULT NOTE - INITIAL  Pharmacy Consult for Ceftazidime, Vancomycin Indication: endocarditis, pseudomonal UTI  Allergies  Allergen Reactions  . Food Swelling    Red peppers   Labs:  Recent Labs  12/29/14 0550 12/30/14 0510 12/31/14 0525  WBC 1.6* 1.7* 1.7*  HGB 8.4* 8.4* 8.2*  PLT 97* 110* 138*  CREATININE 3.70* 4.57* 4.49*   Estimated Creatinine Clearance: 19.3 mL/min (by C-G formula based on Cr of 4.49). No results for input(s): VANCOTROUGH, VANCOPEAK, VANCORANDOM, GENTTROUGH, GENTPEAK, GENTRANDOM, TOBRATROUGH, TOBRAPEAK, TOBRARND, AMIKACINPEAK, AMIKACINTROU, AMIKACIN in the last 72 hours.    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  Currently on cefazolin monotherapy for UTI but now growing pseudomonas in urine culture and ID is in favor of switching to ceftazidime and adding Vancomycin for possible endocarditis   Ceftriaxone 9/5>>9/6, 9/7>>9/8 Cipro 9/6>>9/7 Cephalexin 9/8>>9/12 Cefuroxime>9/26 x 1 for AVG surgery  Vancomycin 9/27>10/1; 10/3>>  Cefazolin 10/1>>10/3 Ceftazidime 10/3>>   Urine 9/5>> > K pneumo susceptible to CTX/Cefazolin BC x 2 9/26> NGTD BC x 2 9/27> NGTD UA 10/1: nitrite positive, bacteria many 10/1: UCx >> pseudomonas (susceptilities pending)  Goal of Therapy:  Eradication of infection  Plan:  -D/c Cefazolin  -Resume Vancomycin 1 gm IV Q HD on T/T/S starting tomorrow since she received Vancomycin with last HD -Start ceftazidime 2 gm IV Q 12 hours -Monitor CBC, cultures and renal plans   Albertina Parr, PharmD., BCPS Clinical Pharmacist Pager [phone]

## 2014-12-31 NOTE — Progress Notes (Signed)
      This patient needs a thigh graft placed for permanent HD access.  We need medical clearance for surgery under general anesthsia and the coumadin will have to be held for several days prior to the procedure.    COLLINS, EMMA MAUREEN PA-C

## 2014-12-31 NOTE — Progress Notes (Signed)
Nutrition Follow-up  DOCUMENTATION CODES:   Morbid obesity  INTERVENTION:   -Decrease 30 ml Prostat to daily -Continue Renal MVI -Continue between meal nourishments  NUTRITION DIAGNOSIS:   Increased nutrient needs related to chronic illness as evidenced by estimated needs.  Ongoing  GOAL:   Patient will meet greater than or equal to 90% of their needs  Progressing  MONITOR:   PO intake, Supplement acceptance, Weight trends, Labs, I & O's  REASON FOR ASSESSMENT:   Malnutrition Screening Tool    ASSESSMENT:   39 y/o female with PMH of SVT s/p ablation, anemia of chronic disease, HTN, SLE with recently diagnosed lupus nephritis who p/w dysuria * 2 days. Patient states that she recently underwent a renal biopsy on 11/22/14 and was diagnosed with lupus nephritis.  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   Pt talking on phone at time of visit. Noted meal completion has improved (PO: 100%). Pt often refuses Prostat supplement, reporting it is too sweet.  Per MD notes, pt will require permanent HD access prior to d/c. Permanent left thigh graft to be done, however, procedure has been delayed due to coumadin use and need to wait to 2 weeks following acute stroke before undergoing anesthesia.   Reviewed C[facility]RN note on 12/26/14; pt with two areas of stage II pressure injuries (left and rt buttocks). Pt on air mattress.  RNCM following. Will d/c home with home health services once medically ready.  Diet Order:  Diet renal/carb modified with fluid restriction Diet-HS Snack?: Nothing; Room service appropriate?: Yes; Fluid consistency:: Thin  Skin:  Wound (see comment) (2 stage II wounds to buttocks)  Last BM:  12/23/14  Height:   Ht Readings from Last 1 Encounters:  12/03/14 5\' 3"  (1.6 m)    Weight:   Wt Readings from Last 1  Encounters:  12/31/14 224 lb (101.606 kg)    Ideal Body Weight:  52.27 kg  BMI:  Body mass index is 39.69 kg/(m^2).  Estimated Nutritional Needs:   Kcal:  9030-0923  Protein:  110-125 grams  Fluid:  1.2 L/day  EDUCATION NEEDS:   No education needs identified at this time  Ahnya Akre A. Jimmye Norman, RD, LDN, CDE Pager: [phone] After hours Pager: [phone]

## 2014-12-31 NOTE — Progress Notes (Signed)
Subjective: Interval History: has complaints edema.  Objective: Vital signs in last 24 hours: Temp:  [98.2 F (36.8 C)-98.5 F (36.9 C)] 98.2 F (36.8 C) (10/03 0519) Pulse Rate:  [87-110] 87 (10/03 0519) Resp:  [18] 18 (10/03 0519) BP: (124-144)/(71-78) 131/72 mmHg (10/03 0519) SpO2:  [96 %-100 %] 96 % (10/03 0519) Weight:  [101.606 kg (224 lb)] 101.606 kg (224 lb) (10/03 0518) Weight change: 6.35 kg (14 lb)  Intake/Output from previous day: 10/02 0701 - 10/03 0700 In: 362 [P.O.:342; I.V.:20] Out: 1200 [Urine:1200] Intake/Output this shift:    General appearance: alert and morbidly obese Resp: diminished breath sounds bilaterally Cardio: S1, S2 normal and systolic murmur: holosystolic 2/6, blowing at apex GI: obese, bruises Extremities: edema 3+ and fem cath, clotted LUA avg  Lab Results:  Recent Labs  12/30/14 0510 12/31/14 0525  WBC 1.7* 1.7*  HGB 8.4* 8.2*  HCT 25.8* 25.8*  PLT 110* 138*   BMET:  Recent Labs  12/30/14 0510 12/31/14 0525  NA 138 137  K 4.3 4.0  CL 102 103  CO2 27 26  GLUCOSE 90 88  BUN 62* 73*  CREATININE 4.57* 4.49*  CALCIUM 8.2* 8.3*   No results for input(s): PTH in the last 72 hours. Iron Studies: No results for input(s): IRON, TIBC, TRANSFERRIN, FERRITIN in the last 72 hours.  Studies/Results: No results found.  I have reviewed the patient's current medications.  Assessment/Plan: 1ESRD  Has some urine vol and some function but minimal.  MUST HAVE PERM ACCESS PRIOR TO Acceptance to outpatient unit. Will get on TTS sched 2 Anemia ESA,.  3HPTH will F/U 4 Obesity 5 CVA 6 Libman Sachs 7 SLE 8 HTN change meds to work around HD [place].  Needs perm access prior to d/c.  Change bp meds around HD.    LOS: 28 days   Erland Vivas L 12/31/2014,8:29 AM

## 2014-12-31 NOTE — Progress Notes (Signed)
RN report:hold coumadin, surg in 3 days. Rec call from Pharm [id number]-admin coumadin-surg poss in 2 weeks

## 2015-01-01 LAB — RENAL FUNCTION PANEL
Albumin: 2.3 g/dL — ABNORMAL LOW (ref 3.5–5.0)
Anion gap: 7 (ref 5–15)
BUN: 76 mg/dL — AB (ref 6–20)
CHLORIDE: 109 mmol/L (ref 101–111)
CO2: 25 mmol/L (ref 22–32)
Calcium: 8.6 mg/dL — ABNORMAL LOW (ref 8.9–10.3)
Creatinine, Ser: 4.5 mg/dL — ABNORMAL HIGH (ref 0.44–1.00)
GFR calc non Af Amer: 11 mL/min — ABNORMAL LOW (ref >60)
GFR, EST AFRICAN AMERICAN: 13 mL/min — AB (ref >60)
Glucose, Bld: 85 mg/dL (ref 65–99)
Phosphorus: 3.4 mg/dL (ref 2.5–4.6)
Potassium: 4.2 mmol/L (ref 3.5–5.1)
Sodium: 141 mmol/L (ref 135–145)

## 2015-01-01 LAB — PROTIME-INR
INR: 1.39 (ref 0.00–1.49)
Prothrombin Time: 17.2 seconds — ABNORMAL HIGH (ref 11.6–15.2)

## 2015-01-01 LAB — CBC WITH DIFFERENTIAL/PLATELET
Basophils Absolute: 0 10*3/uL (ref 0.0–0.1)
Basophils Relative: 0 %
EOS ABS: 0 10*3/uL (ref 0.0–0.7)
EOS PCT: 1 %
HCT: 26.7 % — ABNORMAL LOW (ref 36.0–46.0)
Hemoglobin: 8.5 g/dL — ABNORMAL LOW (ref 12.0–15.0)
LYMPHS ABS: 0.4 10*3/uL — AB (ref 0.7–4.0)
Lymphocytes Relative: 21 %
MCH: 29.8 pg (ref 26.0–34.0)
MCHC: 31.8 g/dL (ref 30.0–36.0)
MCV: 93.7 fL (ref 78.0–100.0)
Monocytes Absolute: 0.2 10*3/uL (ref 0.1–1.0)
Monocytes Relative: 9 %
Neutro Abs: 1.3 10*3/uL — ABNORMAL LOW (ref 1.7–7.7)
Neutrophils Relative %: 69 %
PLATELETS: 159 10*3/uL (ref 150–400)
RBC: 2.85 MIL/uL — AB (ref 3.87–5.11)
RDW: 15.5 % (ref 11.5–15.5)
WBC: 1.9 10*3/uL — AB (ref 4.0–10.5)

## 2015-01-01 LAB — URINE CULTURE

## 2015-01-01 LAB — LEGIONELLA PNEUMOPHILA TOTAL AB

## 2015-01-01 MED ORDER — DEXTROSE 5 % IV SOLN
2.0000 g | INTRAVENOUS | Status: AC
  Administered 2015-01-03: 2 g via INTRAVENOUS
  Filled 2015-01-01 (×2): qty 2

## 2015-01-01 MED ORDER — DEXTROSE 5 % IV SOLN
2.0000 g | Freq: Once | INTRAVENOUS | Status: AC
  Administered 2015-01-01: 2 g via INTRAVENOUS
  Filled 2015-01-01: qty 2

## 2015-01-01 NOTE — Progress Notes (Signed)
PT Cancellation Note  Patient Details Name: [name] MRN: [id number] DOB: Aug 27, 1975   Cancelled Treatment:    Reason Eval/Treat Not Completed: Other (comment). Pt in HD all AM and have now attempted multiple times this PM. Pt received lunch and has been on the phone with social security >1 hour. Another therapist will attempt shortly. If pt still talking to social security at that time will have to defer until tomorrow.   [name] 01/01/2015, 3:41 PM [employer] PT [phone]

## 2015-01-01 NOTE — Progress Notes (Signed)
Patient ID: [name], female   DOB: 1975/08/26, 39 y.o.   MRN: [id number] Hematology: Now back on Vancomycin to cover possibility that this is culture negative bacterial endocarditis. Urine grew Pseudomonas, Tressie Ellis started. Platelets now normal at 159,000; white count lagging but also recovering at 1,900 with 69% neutrophils. PT/INR 17.2/1.39 after 4 days of coumadin at 4mg , 2 days at 5 mg. Hb stable. Decision whether not to continue coumadin id difficult if we believe that this is a bacterial process with a septic embolus or marantic endocarditis. I would have no major objection to stopping coumadin since even if this is [place] Saks endocarditis, data for benefit of anticoagulation is weak.

## 2015-01-01 NOTE — Progress Notes (Signed)
Subjective: Interval History: has complaints wants to get moving.  Objective: Vital signs in last 24 hours: Temp:  [98.2 F (36.8 C)-99.6 F (37.6 C)] 99.6 F (37.6 C) (10/04 0730) Pulse Rate:  [92-103] 93 (10/04 0802) Resp:  [18] 18 (10/04 0730) BP: (138-166)/(68-91) 162/90 mmHg (10/04 0735) SpO2:  [94 %-99 %] 99 % (10/04 0730) Weight:  [102.059 kg (225 lb)-103.6 kg (228 lb 6.3 oz)] 103.6 kg (228 lb 6.3 oz) (10/04 0730) Weight change: 0.454 kg (1 lb)  Intake/Output from previous day: 10/03 0701 - 10/04 0700 In: 342 [P.O.:342] Out: 1750 [Urine:1750] Intake/Output this shift:    General appearance: alert, cooperative and morbidly obese Resp: diminished breath sounds bilaterally Chest wall: R [place] cath Cardio: S1, S2 normal and systolic murmur: holosystolic 2/6, blowing at apex GI: obese, pos bs, soft Extremities: edema 3+ and R Fem PC  Lab Results:  Recent Labs  12/31/14 0525 01/01/15 0450  WBC 1.7* 1.9*  HGB 8.2* 8.5*  HCT 25.8* 26.7*  PLT 138* 159   BMET:  Recent Labs  12/31/14 0525 01/01/15 0450  NA 137 141  K 4.0 4.2  CL 103 109  CO2 26 25  GLUCOSE 88 85  BUN 73* 76*  CREATININE 4.49* 4.50*  CALCIUM 8.3* 8.6*   No results for input(s): PTH in the last 72 hours. Iron Studies: No results for input(s): IRON, TIBC, TRANSFERRIN, FERRITIN in the last 72 hours.  Studies/Results: No results found.  I have reviewed the patient's current medications.  Assessment/Plan: 1 ESRD  Vol xs for HD.  Will lower vol and solute.  Needs perm access 2 HTN lower vol 3 CVA 4 Endocarditis ? Libmann Saks 5 Anemia stable to better 6 leucopenia  ? Med related 7 Ptlt  Normal 8 obesity 9 HPTH f/u P HD, lower vol , follow bp meds, Perm access.    LOS: 29 days   Jahrell Hamor L 01/01/2015,8:12 AM

## 2015-01-01 NOTE — Procedures (Signed)
I was present at this session.  I have reviewed the session itself and made appropriate changes.  BP ^ to start.  Lowering vol .  Flow limited by AP  Delinda Malan L 10/4/20168:11 AM

## 2015-01-01 NOTE — Progress Notes (Signed)
Antibiotic Time-Out Note  [name] is a 39 y.o. year-old female admitted on 12/03/2014.  The patient is currently on vancomycin for endocarditis and ceftazidime for UTI.   Recent Labs Lab 12/28/14 2025 12/29/14 0550 12/30/14 0510 12/31/14 0525 01/01/15 0450  WBC 1.7* 1.6* 1.7* 1.7* 1.9*    Recent Labs Lab 12/28/14 0453 12/29/14 0550 12/30/14 0510 12/31/14 0525 01/01/15 0450  CREATININE 5.40* 3.70* 4.57* 4.49* 4.50*   Estimated Creatinine Clearance: 19.1 mL/min (by C-G formula based on Cr of 4.5). Tmax/24h AF  Antimicrobial allergies: NKA  Antimicrobials this admission: Ceftriaxone 9/5>>9/6, 9/7>>9/8 Cipro 9/6>>9/7 Cephalexin 9/8>>9/12 Cefuroxime>9/26 x 1 for AVG surgery  Vancomycin 9/27>10/1, 10/3>> Cefazolin 10/1>>10/3 Ceftazidime 10/3>>  Microbiology Results: Urine 9/5>> > K pneumo susceptible to CTX/Cefazolin BC x 2 9/26> NGTD BC x 2 9/27> NGTD Urine 10/1>> pseudomonas (I to cipro)  Assessment: This patient is currently receiving ceftazidime for a pseudomonas UTI.  The patient is on a Tue/Thu/Sat HD schedule indicating a need for dose adjustments.   Plan: Adjust ceftazidime to 2 gm with HD on Tue Thu Sat (was originally scheduled for 2g q12h - none given before HD today. Will scheduled now x 1 then resume with HD) Continue vancomycin 1g with HD Mon clinical progress, VR prn  Levester Fresh, PharmD, BCPS Clinical Pharmacist Pager [phone] 01/01/2015 2:07 PM

## 2015-01-01 NOTE — Evaluation (Cosign Needed)
On 10/3 Ms. Fredrick expressed to the Internal Medicine Teaching Team that she was feeling depressed. In order to evaluate Ms. Pertuit's depression, I spoke with her for about thirty minutes.  Ms. Elwell is an extremely pleasant and easy to speak with. During our discussion, Ms. Eads expressed concern for her two children. She has one daughter who is 39 years old who just started first grade this year, and a 54 year old son. Ms. Beckum's children are currently being cared for by her mother. The patient feels very badly that her children have to see her sick. She mentioned that her mother had breast cancer when she was young, so she knows how difficult it is to have a parent who is hospitalized or sick. She knows it's very hard on her children to have to see her in the hospital. Ms. Foskey's son has not been to visit because it is too difficult for him to see her like this. Ms. Gilkey spoke of nightly phone calls from her children- particularly from her daughter, who is often tearful and says she 'misses her mommy.' The patient has never missed her son's football games, and has missed all of them since being hospitalized. The patient was very tearful during our conversation. The patient's son asked her what she would like to do for her birthday on October 25th, and she told him that she just hopes to be home with them.  Ms. Rando has shared her diagnosis of Lupus with her children, but she is unsure if they truly understand. Every morning, Ms. Pellegrino and her daughter count her pills out together. This is a way that her daughter can practice counting and spelling, and a way Ms. Crayton won't forget to take her medication. Ms. Sheriff usually walks her daughter to school, but when she started feeling weak in early September, this proved to be very difficult for her. One of Ms. Mones goals is to be able to walk her daughter to school again. She was particularly tearful at this time.   Ms. Choung mentioned that she 'couldn't  seen the light at the end of the tunnel' in her Lupus management. She is worried she will just 'keep getting worse'. She is worried she may never return to her regular life. She has very real concerns about her mortality and how it would affect her children. She acknowledges she is lucky to have significant family support. She has two sisters who live in [place] who are visiting and helping her with her children as much as possible. Her sisters are even going to her son's football games so there is someone there to cheer him on. Ms. Devos has a strong belief in God and is finding a lot of comfort through prayer.    I did not discuss pharmacologic therapy to treat Ms. Karczewski's depression, but I will continue to follow up with her about her emotional well being. The findings of my conversation were discussed with the internal medicine teaching team.

## 2015-01-01 NOTE — Progress Notes (Addendum)
Subjective: Patient states she is feeling okay today, but admits that she feels depressed. She says she does not believe she was depressed prior to her lengthy hospital stay.  Objective: Vital signs in last 24 hours: Filed Vitals:   01/01/15 1130 01/01/15 1200 01/01/15 1205 01/01/15 1259  BP: 153/88 148/87 160/92 142/74  Pulse: 103 109 105 99  Temp:   98.7 F (37.1 C) 98.7 F (37.1 C)  TempSrc:   Oral Oral  Resp:   18 16  Height:      Weight:   220 lb 7.4 oz (100 kg)   SpO2:   99% 98%   Weight change: 1 lb (0.454 kg)  Intake/Output Summary (Last 24 hours) at 01/01/15 1725 Last data filed at 01/01/15 1205  Gross per 24 hour  Intake      0 ml  Output   5052 ml  Net  -5052 ml   General: resting in bed Cardiac: RRR, no murmur Pulm: clear to auscultation bilaterally Abd: soft, nontender Ext: warm and well perfused, no pedal edema, no bleeding from temporary right femoral cath Neuro: alert and oriented X3   Assessment/Plan: Principal Problem:   ESRD (end stage renal disease) on dialysis (HCC) Active Problems:   Hypertension   Lupus nephritis (HCC)   AKI (acute kidney injury) ([place])   UTI (lower urinary tract infection)   Hematuria   Retroperitoneal hematoma   Hyperkalemia   Bleeding   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 Vascular surgery following, recommend discontinuing warfarin several days prior to permanent access placement. Neurology has recommended not placing until 2-3 weeks out from stroke. -Discontinue coumadin several days prior to graft placement per  vascular surgery -Continue current management and HD per nephrology -Continue current medications   Endocarditis of mitral valve: Possibly Rudene Christians, but unable to rule out infectious endocarditis as patient is immunosuppressed and leukopenic. Blood cultures negative, however may be false as they were drawn after patient received antibiotics. Will feel more comfortable continuing empiric vancomycin for infectious endocarditis -Start Vancomycin per pharm dosing  UTI: Urine culture growing pseudomonas -Start Ceftazadime IV 2g every 12 hours  Pancytopenia: Improving off of cellcept, received 1 unit PRBCs on 9/30. Platelets WNL now at 159,000.  -Monitor CBC  CVA: Occurred during inpatient stay, likely embolic. INR 1.39 this morning. -Continue Coumadin 5 mg, will need to hold several days prior to permanent access placement and bridge. -PT cleared to work with patient out of bed by nephrology  Depression: Patient understandably with depressed mood during her hospital stay due to the numerous health issues and complications she has dealt with during her stay in addition to being away from home and her family. Please see note from Colin Ina MS4 for further detail and insight into patient's situational depression.  HTN: Continue current meds    Dispo: Disposition is deferred at this time, awaiting improvement of current medical problems.      .Services Needed at time of discharge: Y = Yes, Blank = No PT:   OT:   RN:   Equipment:   Other:  LOS: 29 days   Zada Finders, MD 01/01/2015, 5:25 PM

## 2015-01-01 NOTE — Progress Notes (Signed)
PT Note  Spoke with Dr. Jimmy Footman about OOB with femoral catheter. Pt's catheter is tunneled and is okay for OOB. Will see pt after HD.   [employer] PT [phone]

## 2015-01-01 NOTE — Evaluation (Signed)
Physical Therapy Evaluation Patient Details Name: [name] MRN: [id number] DOB: 1976-03-04 Today's Date: 01/01/2015   History of Present Illness  39 y/o female with PMH of SVT s/p ablation, anemia of chronic disease, HTN, SLE with recently diagnosed lupus nephritis who p/w dysuria * 2 days. Patient states that she recently underwent a renal biopsy on 11/22/14 and was diagnosed with lupus nephritis. The procedure was complicated by post procedure blood loss and received 2 units PRBC. She was started on cellcept and prednisone 60 mg for the lupus nephritis. She presented with dysuria, subjective fevers, nausea and 1 day of hematuria to the ED.  Pt  with new R MCA CVA as well and now on HD for renal failure.    Clinical Impression  Pt admitted with above diagnoses complicated by prolonged bedrest. Patient highly motivated. Sense her mobility was limited today by anxiety, however pt has also been immobile for ~2 weeks. Pt currently with functional limitations due to the deficits listed below (see PT Problem List).  Pt will benefit from skilled PT to increase their independence and safety with mobility to allow discharge to the venue listed below.       Follow Up Recommendations CIR    Equipment Recommendations  Other (comment) (TBA)    Recommendations for Other Services Rehab consult     Precautions / Restrictions Restrictions Weight Bearing Restrictions: No      Mobility  Bed Mobility Overal bed mobility: Needs Assistance Bed Mobility: Rolling;Supine to Sit;Sit to Supine Rolling: Mod assist   Supine to sit: Min assist;HOB elevated Sit to supine: Min assist   General bed mobility comments: gradually elevated HOB with no dizziness; pivoted to EOB with mattress simultaneously deflating (assist for moving legs; pivoting hips on pad); rolling at end of session ?limited by anxiety of rolling off edge  Transfers Overall transfer level: Needs assistance Equipment used: Rolling  walker (2 wheeled) (1 person in front) Transfers: Sit to/from Stand;Lateral/Scoot Transfers Sit to Stand:  (unable with +1)        Lateral/Scoot Transfers: Min assist General transfer comment: attempted standing x 4 with and without RW) pt anxious/fearful and admitted she was afraid she would hurt therapist; anticipate has strength to stand with 2 person assist  Ambulation/Gait                Stairs            Wheelchair Mobility    Modified Rankin (Stroke Patients Only) Modified Rankin (Stroke Patients Only) Pre-Morbid Rankin Score: Slight disability (due to lupus) Modified Rankin: Severe disability     Balance Overall balance assessment: Needs assistance Sitting-balance support: No upper extremity supported;Feet supported Sitting balance-Leahy Scale: Good Sitting balance - Comments: x 10 minutes; able to shift weight for lateral scooting along EOB                                     Pertinent Vitals/Pain Pain Assessment: Faces Faces Pain Scale: Hurts little more Pain Location: Lt arm (post-surgical) Pain Descriptors / Indicators: Operative site guarding Pain Intervention(s): Limited activity within patient's tolerance;Monitored during session    Home Living Family/patient expects to be discharged to:: Private residence Living Arrangements: Children;Parent (40 yo son; 50 yo daughter)   Type of Home: Apartment Home Access: Stairs to enter Entrance Stairs-Rails: None Entrance Stairs-Number of Steps: 1 (curb in parking lot) Home Layout: One level Home Equipment: Bedside commode;Walker -  standard Additional Comments: walker was her mothers    Prior Function Level of Independence: Independent               Hand Dominance   Dominant Hand: Right    Extremity/Trunk Assessment   Upper Extremity Assessment: Defer to OT evaluation           Lower Extremity Assessment: RLE deficits/detail;LLE deficits/detail RLE Deficits /  Details: AROM ankle, knee WFL with strength 5/5 (isometric); AROM hip flexion 45 supine (limited by pain, however able to sit EOB at 90 degrees and felt "better") LLE Deficits / Details: AROM WFL; ankle DF 5/5; PF 4/5 with ?clonus noted; knee extension 5/5  Cervical / Trunk Assessment: Other exceptions  Communication   Communication: No difficulties  Cognition Arousal/Alertness: Awake/alert Behavior During Therapy: WFL for tasks assessed/performed Overall Cognitive Status: Within Functional Limits for tasks assessed                      General Comments General comments (skin integrity, edema, etc.): Tearful when discussing d/c plans    Exercises        Assessment/Plan    PT Assessment Patient needs continued PT services  PT Diagnosis Generalized weakness;Hemiplegia non-dominant side   PT Problem List Decreased strength;Decreased activity tolerance;Decreased range of motion;Decreased balance;Decreased mobility;Decreased knowledge of use of DME;Obesity  PT Treatment Interventions DME instruction;Gait training;Functional mobility training;Stair training;Therapeutic activities;Therapeutic exercise;Balance training;Neuromuscular re-education;Patient/family education   PT Goals (Current goals can be found in the Care Plan section) Acute Rehab PT Goals Patient Stated Goal: go home  PT Goal Formulation: With patient Time For Goal Achievement: 01/15/15 Potential to Achieve Goals: Good    Frequency Min 4X/week   Barriers to discharge        Co-evaluation               End of Session Equipment Utilized During Treatment: Gait belt Activity Tolerance: Patient tolerated treatment well Patient left: in bed;with call bell/phone within reach Nurse Communication: Mobility status         Time: 8850-2774 PT Time Calculation (min) (ACUTE ONLY): 38 min   Charges:   PT Evaluation $Initial PT Evaluation Tier I: 1 Procedure PT Treatments $Therapeutic Activity: 23-37  mins   PT G Codes:        Trine Fread 01-09-15, 5:05 PM Pager [phone]

## 2015-01-02 DIAGNOSIS — I2111 ST elevation (STEMI) myocardial infarction involving right coronary artery: Secondary | ICD-10-CM

## 2015-01-02 DIAGNOSIS — L930 Discoid lupus erythematosus: Secondary | ICD-10-CM

## 2015-01-02 DIAGNOSIS — N3000 Acute cystitis without hematuria: Secondary | ICD-10-CM

## 2015-01-02 DIAGNOSIS — I63511 Cerebral infarction due to unspecified occlusion or stenosis of right middle cerebral artery: Secondary | ICD-10-CM | POA: Diagnosis not present

## 2015-01-02 LAB — CBC WITH DIFFERENTIAL/PLATELET
BASOS PCT: 1 %
Basophils Absolute: 0 10*3/uL (ref 0.0–0.1)
EOS PCT: 1 %
Eosinophils Absolute: 0 10*3/uL (ref 0.0–0.7)
HEMATOCRIT: 27.5 % — AB (ref 36.0–46.0)
Hemoglobin: 8.6 g/dL — ABNORMAL LOW (ref 12.0–15.0)
LYMPHS ABS: 0.4 10*3/uL — AB (ref 0.7–4.0)
Lymphocytes Relative: 21 %
MCH: 29.4 pg (ref 26.0–34.0)
MCHC: 31.3 g/dL (ref 30.0–36.0)
MCV: 93.9 fL (ref 78.0–100.0)
MONO ABS: 0.2 10*3/uL (ref 0.1–1.0)
MONOS PCT: 12 %
Neutro Abs: 1.4 10*3/uL — ABNORMAL LOW (ref 1.7–7.7)
Neutrophils Relative %: 65 %
PLATELETS: 159 10*3/uL (ref 150–400)
RBC: 2.93 MIL/uL — AB (ref 3.87–5.11)
RDW: 15.8 % — AB (ref 11.5–15.5)
WBC: 2 10*3/uL — ABNORMAL LOW (ref 4.0–10.5)

## 2015-01-02 LAB — RENAL FUNCTION PANEL
ALBUMIN: 2.1 g/dL — AB (ref 3.5–5.0)
Anion gap: 10 (ref 5–15)
BUN: 39 mg/dL — AB (ref 6–20)
CALCIUM: 7.9 mg/dL — AB (ref 8.9–10.3)
CO2: 27 mmol/L (ref 22–32)
CREATININE: 3.43 mg/dL — AB (ref 0.44–1.00)
Chloride: 100 mmol/L — ABNORMAL LOW (ref 101–111)
GFR calc Af Amer: 18 mL/min — ABNORMAL LOW (ref >60)
GFR calc non Af Amer: 16 mL/min — ABNORMAL LOW (ref >60)
GLUCOSE: 78 mg/dL (ref 65–99)
PHOSPHORUS: 3.4 mg/dL (ref 2.5–4.6)
Potassium: 4 mmol/L (ref 3.5–5.1)
SODIUM: 137 mmol/L (ref 135–145)

## 2015-01-02 LAB — FACTOR 5 LEIDEN

## 2015-01-02 LAB — PROTIME-INR
INR: 1.58 — ABNORMAL HIGH (ref 0.00–1.49)
Prothrombin Time: 18.9 seconds — ABNORMAL HIGH (ref 11.6–15.2)

## 2015-01-02 LAB — HEPARIN LEVEL (UNFRACTIONATED): Heparin Unfractionated: 0.25 IU/mL — ABNORMAL LOW (ref 0.30–0.70)

## 2015-01-02 MED ORDER — HEPARIN (PORCINE) IN NACL 100-0.45 UNIT/ML-% IJ SOLN
1000.0000 [IU]/h | INTRAMUSCULAR | Status: DC
  Administered 2015-01-02: 950 [IU]/h via INTRAVENOUS
  Filled 2015-01-02: qty 250

## 2015-01-02 MED ORDER — HYDROXYCHLOROQUINE SULFATE 200 MG PO TABS
400.0000 mg | ORAL_TABLET | Freq: Every day | ORAL | Status: DC
  Administered 2015-01-03 – 2015-01-05 (×3): 400 mg via ORAL
  Filled 2015-01-02 (×3): qty 2

## 2015-01-02 NOTE — Progress Notes (Signed)
      Per Dr. Trula Slade we plan left thigh graft placement for HD Friday 01/04/2015 by Dr. Donnetta Hutching.  Coumadin will be held starting today and pharmacy will dose heparin until after surgery and coumadin can be restarted.  I spoke with Dr. Posey Pronto on the phone regarding this plan.  Thank you for the help.  Cay Kath MAUREEN PA-C

## 2015-01-02 NOTE — Progress Notes (Addendum)
   Subjective: Patient states she is feeling well today. Is working with PT and walked a few steps and was able to bathe herself. Objective: Vital signs in last 24 hours: Filed Vitals:   01/01/15 1205 01/01/15 1259 01/01/15 2159 01/02/15 0535  BP: 160/92 142/74 146/85 136/84  Pulse: 105 99 101 105  Temp: 98.7 F (37.1 C) 98.7 F (37.1 C) 99 F (37.2 C) 98.6 F (37 C)  TempSrc: Oral Oral Oral Oral  Resp: 18 16 15 15   Height:      Weight: 220 lb 7.4 oz (100 kg)     SpO2: 99% 98% 94% 96%   Weight change: 3 lb 6.3 oz (1.541 kg)  Intake/Output Summary (Last 24 hours) at 01/02/15 1235 Last data filed at 01/02/15 0605  Gross per 24 hour  Intake     20 ml  Output    400 ml  Net   -380 ml   General: resting in chair Cardiac: RRR, no murmur Pulm: clear to auscultation bilaterally Abd: soft, nontender Ext: warm and well perfused, no pedal edema Neuro: alert and oriented X3   Assessment/Plan: Principal Problem:   ESRD (end stage renal disease) on dialysis (HCC) Active Problems:   Hypertension   Lupus nephritis (HCC)   UTI (lower urinary tract infection)   Retroperitoneal hematoma   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 -Discontinue coumadin, start bridging with heparin per pharm dosing -Recheck PT/INR -Continue current management and TTS HD per nephrology  Endocarditis of mitral valve: Possibly Rudene Christians, but unable to rule out infectious endocarditis as patient is immunosuppressed and leukopenic. Blood cultures negative, however may be false as they were drawn after patient received antibiotics.  -Continue Vancomycin per pharm dosing for 3 weeks (end 10/25)  UTI: Patient with dysuria several days ago with  urine culture growing pseudomonas. Likely a simple pseudomonal cystitis. Patient has no urinary symptoms at this time. Results show sensitivity to ceftazadime which we are continuing per pharm dosing with dialysis. Has currently received 2 doses, will treat for 5 total doses  -Continue Ceftazadime for 5 total doses  Pancytopenia: Improving off of cellcept, received 1 unit PRBCs on 9/30. Platelets WNL now at 159,000.  -Monitor CBC  Right MCA stroke: Occurred during inpatient stay, likely embolic. Need to hold coumadin starting today for graft placement this Friday -d/c coumadin, start heparin bridging per pharm dosing -Continue PT  Depression: Patient states there is not much change in her mood at this time, but feels like she will have improvement as she works more with PT and makes progress towards walking on her own. -Continue PT and   HTN: Continue current meds    Dispo: Disposition is deferred at this time, awaiting improvement of current medical problems.      .Services Needed at time of discharge: Y = Yes, Blank = No PT:   OT:   RN:   Equipment:   Other:     LOS: 30 days   Zada Finders, MD 01/02/2015, 12:35 PM

## 2015-01-02 NOTE — Progress Notes (Signed)
Patient ID: [name], female   DOB: 1975/11/22, 39 y.o.   MRN: [id number]  Date: 01/02/2015  Patient name: [name]  Medical record number: [id number]  Date of birth: [birth year]   [name] plan of care was discussed with the house staff. Please see their note for complete details. I concur with their findings. [name] is slowly improving. She is beginning to work with physical therapy. She is still feeling depressed but agrees that having a plan to get her back home will be the best treatment for her depression. She will be scheduled for left thigh graft surgery on 01/04/2015. We are continuing antibiotic therapy for Pseudomonas cystitis and possible mitral valve endocarditis. Her pancytopenia is slowly improving off of CellCept. I discussed the situation with her rheumatologist, Dr. Ree Edman, today. He recommends increasing her Plaquenil to 400 mg daily.   Michel Bickers, MD 01/02/2015, 12:56 PM

## 2015-01-02 NOTE — Progress Notes (Signed)
ANTICOAGULATION CONSULT NOTE - Initial Consult  Pharmacy Consult for Heparin Indication: bridging therapy until L thigh graft placement by Vascular sx on 01/04/15 as coumadin is on hold for embolic stroke in setting of R MCA stroke and mitral valve endocarditis (? Rudene Christians). Previously on coumadin from 9/28-10/04 with no bridge or overlap therapy.    Patient Measurements: Height: 5\' 3"  (160 cm) Weight: 220 lb 7.4 oz (100 kg) IBW/kg (Calculated) : 52.4 Heparin Dosing Weight: 76 kg  Vital Signs: Temp: 100.3 F (37.9 C) (10/05 2100) Temp Source: Oral (10/05 2100) BP: 148/80 mmHg (10/05 2100) Pulse Rate: 108 (10/05 2100)  Labs:  Recent Labs  12/31/14 0525 01/01/15 0450 01/02/15 0605 01/02/15 2214  HGB 8.2* 8.5* 8.6*  --   HCT 25.8* 26.7* 27.5*  --   PLT 138* 159 159  --   LABPROT 16.5* 17.2* 18.9*  --   INR 1.32 1.39 1.58*  --   HEPARINUNFRC  --   --   --  0.25*  CREATININE 4.49* 4.50* 3.43*  --     Estimated Creatinine Clearance: 25.1 mL/min (by C-G formula based on Cr of 3.43).   Medical History: Past Medical History  Diagnosis Date  . Hypertension   . Cardiac arrhythmia   . SVT (supraventricular tachycardia)     "today, last week, 2 wk ago; maybe 1 month ago, etc; started w/in last 3-4 yrs"(07/20/2012)  . Fainting     "~ 1 month ago; probably related to SVT" (07/20/2012)  . Shortness of breath     "related to SVT episodes" (07/20/2012)  . Chest pain at rest     "related to SVT" (07/20/2012)  . Lupus (systemic lupus erythematosus)   . GERD (gastroesophageal reflux disease)   . Fibromyalgia   . Irritable bowel syndrome (IBS)   . Hypercholesterolemia   . Heart murmur     "small" (12/25/2014)  . TIA (transient ischemic attack) 12/21/2014  . Sleep apnea     "don't wear mask anymore" (12/25/2014)  . Anemia   . History of blood transfusion "several"    "related to low counts"  . Daily headache   . Arthritis     "hips" (12/25/2014)  . Anxiety     "sometimes;  don't take anything for it" (12/25/2014)  . ESRD (end stage renal disease) on dialysis since 12/07/2014    "I've been doing it here on [facility]" (12/25/2014)  . Chronic renal failure, stage 4 (severe)   . Renal insufficiency     Medications:  Heparin Cefttaz/Vanco Warfarin dosed by MD on hold as of 10/4  Assessment: 38yoF with R MCA stroke and mitral valve endocarditis in need of bridging therapy with heparin while coumadin is on hold for L thigh graft placement.  Initial HL is slightly subtherapeutic at 0.25 on heparin 950 units/hr. Nurse reports no issues with infusion or bleeding.  Goal of Therapy:  Heparin level 0.3-0.5 units/ml Monitor platelets by anticoagulation protocol: Yes   Plan:  Increase heparin to 1050 units/hr 6h HL Daily HL/CBC Monitor s/sx of bleeding   Andrey Cota. Diona Foley, PharmD Clinical Pharmacist Pager [phone]  01/02/2015, 10:40 PM

## 2015-01-02 NOTE — Progress Notes (Signed)
   01/02/15 1438  What Happened  Was fall witnessed? Yes  Who witnessed fall? Deveta Hooper, NT  Patients activity before fall ambulating-assisted  Point of contact other (comment) (knees)  Was patient injured? No  Follow Up  MD notified Dr. Posey Pronto  Time MD notified 1515  Adult Fall Risk Assessment  Risk Factor Category (scoring not indicated) Fall has occurred during this admission (document High fall risk)  Patient's Fall Risk High Fall Risk (>13 points)  Adult Fall Risk Interventions  Required Bundle Interventions *See Row Information* High fall risk - low, moderate, and high requirements implemented  Additional Interventions Individualized elimination schedule;Pharmacy review of medications;Secure all tubes/drains  Fall with Injury Screening  Risk For Fall Injury- See Row Information  F;Nurse judgement  Intervention(s) for 2 or more risk criteria identified Low Bed  Vitals  Temp 98.7 F (37.1 C)  Temp Source Oral  BP 127/75 mmHg  MAP (mmHg) 86  BP Location Right Arm  BP Method Automatic  Patient Position (if appropriate) Lying  Pulse Rate (!) 123  Pulse Rate Source Monitor  Resp 20  Oxygen Therapy  SpO2 100 %  O2 Device Room Air  Pain Assessment  Pain Assessment No/denies pain  Pain Score 0  2nd Pain Site  Pain Score 0  Neurological  Neuro (WDL) WDL  Level of Consciousness Alert  Orientation Level Oriented X4  Cognition Follows commands  Speech Clear  Pupil Assessment  Yes  R Pupil Size (mm) 3  R Pupil Shape Round  R Pupil Reaction Brisk  L Pupil Size (mm) 3  L Pupil Shape Round  L Pupil Reaction Brisk  Additional Pupil Assessments No  Pt sitting in recliner chair by bedside, stable. 2Nurse techs assisted patient to transfer from recliner chair to bed when pts legs "buckled" pt was lowered to knees by techs.

## 2015-01-02 NOTE — Progress Notes (Signed)
   01/02/15 1500  Clinical Encounter Type  Visited With Patient  Visit Type Initial;Psychological support;Spiritual support;Social support  Referral From Nurse;Physician  Spiritual Encounters  Spiritual Needs Prayer;Emotional  Stress Factors  Patient Stress Factors Family relationships;Loss of control;Major life changes  Family Stress Factors Health changes;Loss of control   Chaplain visited with Pt.and will follow up during the week.

## 2015-01-02 NOTE — Progress Notes (Signed)
Occupational Therapy Treatment Patient Details Name: [name] MRN: [id number] DOB: 03-24-1976 Today's Date: 01/02/2015    History of present illness 39 y/o female with PMH of SVT s/p ablation, anemia of chronic disease, HTN, SLE with recently diagnosed lupus nephritis who p/w dysuria * 2 days. Patient states that she recently underwent a renal biopsy on 11/22/14 and was diagnosed with lupus nephritis. The procedure was complicated by post procedure blood loss and received 2 units PRBC. She was started on cellcept and prednisone 60 mg for the lupus nephritis. She presented with dysuria, subjective fevers, nausea and 1 day of hematuria to the ED.  Pt  with new R MCA CVA as well and now on HD for renal failure.   OT comments  Pt making progress and states that she is feeling better. Pt's goals updated not that clarification given for OOB activity. Pt able to transfer from bed to recliner for morning ADLs. OT will continue to follow  Follow Up Recommendations  CIR    Equipment Recommendations  Other (comment) (TBD)    Recommendations for Other Services      Precautions / Restrictions Precautions Precautions: Fall Precaution Comments: Pt's femoral cath is tunneled therefore she is able to get OOB. Restrictions Weight Bearing Restrictions: No       Mobility Bed Mobility Overal bed mobility: Needs Assistance Bed Mobility: Supine to Sit;Rolling Rolling: Supervision   Supine to sit: HOB elevated;Supervision Sit to supine: Min assist   General bed mobility comments: Incr time and use of rail  Transfers Overall transfer level: Needs assistance Equipment used: Rolling walker (2 wheeled) Transfers: Sit to/from Stand Sit to Stand: +2 physical assistance;Min assist        Lateral/Scoot Transfers: Min assist General transfer comment: Assist to bring hips up.    Balance Overall balance assessment: Needs assistance Sitting-balance support: No upper extremity supported;Feet  supported Sitting balance-Leahy Scale: Good     Standing balance support: During functional activity Standing balance-Leahy Scale: Poor Standing balance comment: walker and min A for static standing                   ADL       Grooming: Wash/dry hands;Wash/dry face;Set up;Sitting   Upper Body Bathing: Set up;Sitting   Lower Body Bathing: Moderate assistance;Sitting/lateral leans   Upper Body Dressing : Set up;Sitting       Toilet Transfer: +2 for physical assistance;Minimal assistance;RW   Toileting- Clothing Manipulation and Hygiene: Total assistance       Functional mobility during ADLs: Minimal assistance;+2 for safety/equipment        Vision  no change from baseline                   Perception Perception Perception Tested?: No   Praxis Praxis Praxis tested?: Within functional limits    Cognition   Behavior During Therapy: WFL for tasks assessed/performed Overall Cognitive Status: Within Functional Limits for tasks assessed                       Extremity/Trunk Assessment   generalized weakness                        General Comments  pt very pleasant and cooperative    Pertinent Vitals/ Pain       Pain Assessment: 0-10 Faces Pain Scale: Hurts little more  Home Living  at home with her children  Prior Functioning/Environment   independent           Frequency Min 3X/week     Progress Toward Goals  OT Goals(current goals can now be found in the care plan section)  Progress towards OT goals: OT to reassess next treatment  Acute Rehab OT Goals Patient Stated Goal: go home  ADL Goals Pt Will Perform Lower Body Bathing: with mod assist;sitting/lateral leans;sit to/from stand Pt Will Perform Lower Body Dressing: with mod assist;sitting/lateral leans;sit to/from stand Pt Will Transfer to Toilet: with min assist;with min guard assist;ambulating;regular  height toilet Pt Will Perform Toileting - Clothing Manipulation and hygiene: with mod assist;sit to/from stand  Plan Discharge plan remains appropriate    Co-evaluation    PT/OT/SLP Co-Evaluation/Treatment: Yes Reason for Co-Treatment: For patient/therapist safety   OT goals addressed during session: ADL's and self-care      End of Session Equipment Utilized During Treatment: Rolling walker   Activity Tolerance Patient tolerated treatment well   Patient Left with call bell/phone within reach;in chair   Nurse Communication          Time: 7939-0300 OT Time Calculation (min): 37 min  Charges: OT General Charges $OT Visit: 1 Procedure OT Treatments $Self Care/Home Management : 8-22 mins $Therapeutic Activity: 8-22 mins  Britt Bottom 01/02/2015, 1:17 PM

## 2015-01-02 NOTE — Progress Notes (Signed)
ANTICOAGULATION CONSULT NOTE - Initial Consult  Pharmacy Consult for Heparin Indication: bridging therapy until L thigh graft placement by Vascular sx on 01/04/15 as coumadin is on hold for embolic stroke in setting of R MCA stroke and mitral valve endocarditis (? Rudene Christians). Previously on coumadin from  9/28-10/04 with no bridge or overlap therapy.    Patient Measurements: Height: 5\' 3"  (160 cm) Weight: 220 lb 7.4 oz (100 kg) IBW/kg (Calculated) : 52.4 Heparin Dosing Weight: 76 kg  Vital Signs: Temp: 98.6 F (37 C) (10/05 0535) Temp Source: Oral (10/05 0535) BP: 127/75 mmHg (10/05 1438) Pulse Rate: 123 (10/05 1438)  Labs:  Recent Labs  12/31/14 0525 01/01/15 0450 01/02/15 0605  HGB 8.2* 8.5* 8.6*  HCT 25.8* 26.7* 27.5*  PLT 138* 159 159  LABPROT 16.5* 17.2* 18.9*  INR 1.32 1.39 1.58*  CREATININE 4.49* 4.50* 3.43*    Estimated Creatinine Clearance: 25.1 mL/min (by C-G formula based on Cr of 3.43).   Medical History: Past Medical History  Diagnosis Date  . Hypertension   . Cardiac arrhythmia   . SVT (supraventricular tachycardia)     "today, last week, 2 wk ago; maybe 1 month ago, etc; started w/in last 3-4 yrs"(07/20/2012)  . Fainting     "~ 1 month ago; probably related to SVT" (07/20/2012)  . Shortness of breath     "related to SVT episodes" (07/20/2012)  . Chest pain at rest     "related to SVT" (07/20/2012)  . Lupus (systemic lupus erythematosus)   . GERD (gastroesophageal reflux disease)   . Fibromyalgia   . Irritable bowel syndrome (IBS)   . Hypercholesterolemia   . Heart murmur     "small" (12/25/2014)  . TIA (transient ischemic attack) 12/21/2014  . Sleep apnea     "don't wear mask anymore" (12/25/2014)  . Anemia   . History of blood transfusion "several"    "related to low counts"  . Daily headache   . Arthritis     "hips" (12/25/2014)  . Anxiety     "sometimes; don't take anything for it" (12/25/2014)  . ESRD (end stage renal disease) on  dialysis since 12/07/2014    "I've been doing it here on [facility]" (12/25/2014)  . Chronic renal failure, stage 4 (severe)   . Renal insufficiency     Medications:  Heparin Cefttaz/Vanco Warfarin dosed by MD on hold as of 10/4  Assessment: 38yoF with R MCA stroke and mitral valve endocarditis in need of bridging therapy with heparin while coumadin is on hold for L thigh graft placement.  Goal of Therapy:  Heparin level 0.3-0.5 units/ml Monitor platelets by anticoagulation protocol: Yes   Plan:  1. Start heparin infusion at 950 units/hr (12.5 units/kg). No bolus due to hx of CVA and INR of 1.58. 2. HL in ~ 6 hours adjust prn for goal level of 0.3-0.5. 3. Daily CBC   Vincenza Hews, PharmD, BCPS 01/02/2015, 3:40 PM Pager: [phone]

## 2015-01-02 NOTE — Progress Notes (Signed)
Rehab Admissions Coordinator Note:  Patient was screened by Retta Diones for appropriateness for an Inpatient Acute Rehab Consult.  At this time, an inpatient rehab consult has been ordered and is pending.  We will follow up once consult is completed.  Jodell Cipro M 01/02/2015, 8:14 AM  I can be reached at [phone].

## 2015-01-02 NOTE — Progress Notes (Signed)
Subjective: Interval History: has no complaints, ready to work on PT.  Objective: Vital signs in last 24 hours: Temp:  [98.6 F (37 C)-99 F (37.2 C)] 98.6 F (37 C) (10/05 0535) Pulse Rate:  [93-109] 105 (10/05 0535) Resp:  [15-18] 15 (10/05 0535) BP: (136-167)/(74-96) 136/84 mmHg (10/05 0535) SpO2:  [94 %-99 %] 96 % (10/05 0535) Weight:  [100 kg (220 lb 7.4 oz)] 100 kg (220 lb 7.4 oz) (10/04 1205) Weight change: 1.541 kg (3 lb 6.3 oz)  Intake/Output from previous day: 10/04 0701 - 10/05 0700 In: 20 [I.V.:20] Out: 4402 [Urine:400] Intake/Output this shift:    General appearance: alert, cooperative, morbidly obese and pale Resp: diminished breath sounds bilaterally Cardio: S1, S2 normal and systolic murmur: holosystolic 2/6, blowing at apex GI: obese, pos bs, soft Extremities: edema 2-3+ and R fem cath  Lab Results:  Recent Labs  01/01/15 0450 01/02/15 0605  WBC 1.9* 2.0*  HGB 8.5* 8.6*  HCT 26.7* 27.5*  PLT 159 159   BMET:  Recent Labs  01/01/15 0450 01/02/15 0605  NA 141 137  K 4.2 4.0  CL 109 100*  CO2 25 27  GLUCOSE 85 78  BUN 76* 39*  CREATININE 4.50* 3.43*  CALCIUM 8.6* 7.9*   No results for input(s): PTH in the last 72 hours. Iron Studies: No results for input(s): IRON, TIBC, TRANSFERRIN, FERRITIN in the last 72 hours.  Studies/Results: No results found.  I have reviewed the patient's current medications.  Assessment/Plan: 1  ESRD vol xs but better, cont to lower.  Feels better with adequate HD and lower vol.  Needs perm access. 2 HTN lower vol , lower meds. 3 libman Saks   4 SLE stable 5 Obesity 6 CVA 7 Anemia esa/fe 8 HPTH  P Hd, follow counts, get perm access.  Esa, lower vol, PT    LOS: 30 days   Quynh Basso L 01/02/2015,8:18 AM

## 2015-01-02 NOTE — Consult Note (Signed)
Physical Medicine and Rehabilitation Consult Reason for Consult: Right MCA infarct with history of lupus Referring Physician: Internal medicine   HPI: [name] is a 39 y.o. right hand female with history of fibromyalgia, SVT status post ablation, chronic anemia, hypertension, SLE with recently diagnosed lupus nephritis. Patient lives with her mother and 2 young children. Independent prior to admission. Presented 12/03/2014 with dysuria fever as well as nausea. Placed on broad-spectrum anti-biotic's. Creatinine elevated 8.65 followed by renal services. CT abdomen and pelvis showed a small chronic left retroperitoneal hematoma and chronic subcapsular hematoma. No findings to suggest recurrent hemorrhage. Underwent HD catheter placement per radiology services 12/06/2014 and dialysis initiated with permanent dialysis catheter 12/11/2014 per Dr. Trula Slade that required ligation of AV graft due to ischemic steal of left arm 12/19/2014. On 12/21/2014 patient became verbally less responsive left facial droop right eye deviation. Cranial CT scan negative. Neurology service is consulted. MRI of the brain showed right MCA infarct. MRA of the head negative. CT angiogram of the neck showed no extracranial cerebrovascular disease. Echocardiogram with ejection fraction of 52% grade 1 diastolic dysfunction. TEE with systolic normal function. There was a mobile density off the lateral aspect of the posterior mitral valve leaflet consistent with vegetation. Patient currently maintained on Coumadin as well as broad-spectrum anti-biotics. Wound care nurse consulted 12/21/2014 for sacral ulcer with dressing changes as advised. Hospital course further complicated by tachycardia and UTI.  Physical therapy evaluation completed 01/01/2015 with recommendations of physical medicine rehabilitation consult.   Review of Systems  Constitutional: Negative for fever and chills.  Eyes: Negative for blurred vision and double  vision.  Respiratory: Negative for cough.        Shortness of breath on exertion  Cardiovascular: Positive for leg swelling. Negative for chest pain.  Gastrointestinal: Negative for vomiting.       GERD  Genitourinary: Negative for dysuria.  Skin: Negative for rash.  Neurological: Positive for weakness. Negative for seizures and loss of consciousness.  All other systems reviewed and are negative.  Past Medical History  Diagnosis Date  . Hypertension   . Cardiac arrhythmia   . SVT (supraventricular tachycardia)     "today, last week, 2 wk ago; maybe 1 month ago, etc; started w/in last 3-4 yrs"(07/20/2012)  . Fainting     "~ 1 month ago; probably related to SVT" (07/20/2012)  . Shortness of breath     "related to SVT episodes" (07/20/2012)  . Chest pain at rest     "related to SVT" (07/20/2012)  . Lupus (systemic lupus erythematosus)   . GERD (gastroesophageal reflux disease)   . Fibromyalgia   . Irritable bowel syndrome (IBS)   . Hypercholesterolemia   . Heart murmur     "small" (12/25/2014)  . TIA (transient ischemic attack) 12/21/2014  . Sleep apnea     "don't wear mask anymore" (12/25/2014)  . Anemia   . History of blood transfusion "several"    "related to low counts"  . Daily headache   . Arthritis     "hips" (12/25/2014)  . Anxiety     "sometimes; don't take anything for it" (12/25/2014)  . ESRD (end stage renal disease) on dialysis since 12/07/2014    "I've been doing it here on [facility]" (12/25/2014)  . Chronic renal failure, stage 4 (severe)   . Renal insufficiency    Past Surgical History  Procedure Laterality Date  . Cesarean section  2001; 2010  . Cervical biopsy  2013  .  Supraventricular tachycardia ablation  07/21/12     Dr. Cristopher Peru   . Peripheral vascular catheterization N/A 12/14/2014    Procedure: Upper Extremity Venography;  Surgeon: Elam Dutch, MD;  Location: [facility];  Service: Cardiovascular;  Laterality: N/A;  . Insertion hybrid  anteriovenous gortex graft Left 12/18/2014    Procedure: INSERTION GORE HYBRID ARTERIOVENOUS GRAFT LEFT AXILLO-BRACHIAL.;  Surgeon: Serafina Mitchell, MD;  Location: [facility] OR;  Service: Vascular;  Laterality: Left;  . Ligation of arteriovenous  fistula Left 12/19/2014    Procedure: LIGATION OF FISTULA;  Surgeon: Elam Dutch, MD;  Location: [facility];  Service: Vascular;  Laterality: Left;  . Appendectomy  2011  . Tubal ligation  2010  . Tee without cardioversion N/A 12/25/2014    Procedure: TRANSESOPHAGEAL ECHOCARDIOGRAM (TEE);  Surgeon: Sueanne Margarita, MD;  Location: [facility] ENDOSCOPY;  Service: Cardiovascular;  Laterality: N/A;   Family History  Problem Relation Age of Onset  . Cancer Mother     breast and ovarian  . BRCA 1/2 Sister    Social History:  reports that she has never smoked. She has never used smokeless tobacco. She reports that she does not drink alcohol or use illicit drugs. Allergies:  Allergies  Allergen Reactions  . Food Swelling    Red peppers   Medications Prior to Admission  Medication Sig Dispense Refill  . acetaminophen (TYLENOL) 500 MG tablet Take 1,000 mg by mouth every 6 (six) hours as needed (pain).     Marland Kitchen alendronate (FOSAMAX) 70 MG tablet Take 70 mg by mouth once a week. Take on Wednesdays with a full glass of water on an empty stomach.    Marland Kitchen amLODipine (NORVASC) 10 MG tablet Take 1 tablet (10 mg total) by mouth daily. (Patient taking differently: Take 10 mg by mouth 2 (two) times daily. ) 30 tablet 2  . Calcium Carbonate Antacid (TUMS PO) Take 1-2 tablets by mouth daily as needed (heartburn).    . cyclobenzaprine (FLEXERIL) 10 MG tablet Take 10 mg by mouth at bedtime as needed for muscle spasms.     . famotidine (PEPCID) 40 MG tablet Take 1 tablet (40 mg total) by mouth daily. 20 tablet 0  . mycophenolate (CELLCEPT) 500 MG tablet Take 1,500 mg by mouth 2 (two) times daily.     . ondansetron (ZOFRAN ODT) 4 MG disintegrating tablet 18m ODT q4 hours prn nausea/vomit 10  tablet 0  . oxyCODONE-acetaminophen (PERCOCET) 5-325 MG per tablet Take 1 tablet by mouth every 4 (four) hours as needed for severe pain. 20 tablet 0  . predniSONE (DELTASONE) 20 MG tablet Take 20 mg by mouth 3 (three) times daily.     . simvastatin (ZOCOR) 40 MG tablet Take 40 mg by mouth daily.    . [DISCONTINUED] metoprolol succinate (TOPROL-XL) 25 MG 24 hr tablet Take 25 mg by mouth daily.      Home: Home Living Family/patient expects to be discharged to:: Private residence Living Arrangements: Children, Parent (128yo son; 657yo daughter) Type of Home: Apartment Home Access: Stairs to enter ETechnical brewerof Steps: 1 (curb in parking lot) Entrance Stairs-Rails: None Home Layout: One level Bathroom Shower/Tub: TChiropodist Standard Bathroom Accessibility: Yes Home Equipment: BEngineer, civil (consulting) WEnvironmental consultant- standard Additional Comments: walker was her mothers  Functional History: Prior Function Level of Independence: Independent Functional Status:  Mobility: Bed Mobility Overal bed mobility: Needs Assistance Bed Mobility: Supine to Sit, Rolling Rolling: Supervision Supine to sit: HOB  elevated, Supervision Sit to supine: Min assist General bed mobility comments: Incr time and use of rail Transfers Overall transfer level: Needs assistance Equipment used: Rolling walker (2 wheeled) Transfers: Sit to/from Stand Sit to Stand: +2 physical assistance, Min assist  Lateral/Scoot Transfers: Min assist General transfer comment: Assist to bring hips up. Ambulation/Gait Ambulation/Gait assistance: +2 physical assistance, Min assist Ambulation Distance (Feet): 6 Feet Assistive device: Rolling walker (2 wheeled) Gait Pattern/deviations: Step-through pattern, Decreased step length - right, Decreased step length - left General Gait Details: Assist for balance and stability Gait velocity: decr Gait velocity interpretation: Below normal speed for age/gender     ADL: ADL Overall ADL's : Needs assistance/impaired Eating/Feeding: Modified independent, Bed level Grooming: Wash/dry hands, Wash/dry face, Set up, Sitting Upper Body Bathing: Set up, Sitting Lower Body Bathing: Moderate assistance, Sitting/lateral leans Upper Body Dressing : Set up, Sitting Toilet Transfer: +2 for physical assistance, Minimal assistance, RW Toileting- Clothing Manipulation and Hygiene: Total assistance Functional mobility during ADLs: Minimal assistance, +2 for safety/equipment General ADL Comments: Limited eval secondary to pt still with femoral HD cathetor and on bedrest.  Pt issued red medium resistance theraputty and exercise handout for strengthening.  Reviewed exercises and pt is able to complete them with supervision.    Cognition: Cognition Overall Cognitive Status: Within Functional Limits for tasks assessed Orientation Level: Oriented X4 Cognition Arousal/Alertness: Awake/alert Behavior During Therapy: WFL for tasks assessed/performed Overall Cognitive Status: Within Functional Limits for tasks assessed Memory:  (Memory [place] for gross testing.  Will continue to monitor in treatment.)  Blood pressure 127/75, pulse 123, temperature 98.7 F (37.1 C), temperature source Oral, resp. rate 20, height 5' 3"  (1.6 m), weight 100 kg (220 lb 7.4 oz), last menstrual period 11/17/2014, SpO2 100 %. Physical Exam  Vitals reviewed. Constitutional: She is oriented to person, place, and time. She appears well-developed and well-nourished.  Obese  HENT:  Head: Normocephalic and atraumatic.  Mild left facial droop  Eyes: Conjunctivae and EOM are normal.  Neck: Normal range of motion. Neck supple. No thyromegaly present.  Cardiovascular: Normal rate.   No murmur heard. Cardiac rate controlled  Respiratory: Effort normal and breath sounds normal. No respiratory distress.  GI: Soft. Bowel sounds are normal. She exhibits no distension.  Musculoskeletal:  RUE 4+/5  grossly LUE 4/5 grossly  RLE proximal 3-/5, distally 4/5 grossly LLE proximal 3-/5, distally 4/5 grossly  Neurological: She is alert and oriented to person, place, and time. No cranial nerve deficit.  +Homan's LUE Sensation to light touch intact  Skin: Skin is warm and dry.  Sacral ulcer not examined  Psychiatric: She has a normal mood and affect. Her behavior is normal.    Results for orders placed or performed during the hospital encounter of 12/03/14 (from the past 24 hour(s))  Renal function panel     Status: Abnormal   Collection Time: 01/02/15  6:05 AM  Result Value Ref Range   Sodium 137 135 - 145 mmol/L   Potassium 4.0 3.5 - 5.1 mmol/L   Chloride 100 (L) 101 - 111 mmol/L   CO2 27 22 - 32 mmol/L   Glucose, Bld 78 65 - 99 mg/dL   BUN 39 (H) 6 - 20 mg/dL   Creatinine, Ser 3.43 (H) 0.44 - 1.00 mg/dL   Calcium 7.9 (L) 8.9 - 10.3 mg/dL   Phosphorus 3.4 2.5 - 4.6 mg/dL   Albumin 2.1 (L) 3.5 - 5.0 g/dL   GFR calc non Af Amer 16 (L) >60 mL/min  GFR calc Af Amer 18 (L) >60 mL/min   Anion gap 10 5 - 15  Protime-INR     Status: Abnormal   Collection Time: 01/02/15  6:05 AM  Result Value Ref Range   Prothrombin Time 18.9 (H) 11.6 - 15.2 seconds   INR 1.58 (H) 0.00 - 1.49  CBC with Differential     Status: Abnormal   Collection Time: 01/02/15  6:05 AM  Result Value Ref Range   WBC 2.0 (L) 4.0 - 10.5 K/uL   RBC 2.93 (L) 3.87 - 5.11 MIL/uL   Hemoglobin 8.6 (L) 12.0 - 15.0 g/dL   HCT 27.5 (L) 36.0 - 46.0 %   MCV 93.9 78.0 - 100.0 fL   MCH 29.4 26.0 - 34.0 pg   MCHC 31.3 30.0 - 36.0 g/dL   RDW 15.8 (H) 11.5 - 15.5 %   Platelets 159 150 - 400 K/uL   Neutrophils Relative % 65 %   Lymphocytes Relative 21 %   Monocytes Relative 12 %   Eosinophils Relative 1 %   Basophils Relative 1 %   Neutro Abs 1.4 (L) 1.7 - 7.7 K/uL   Lymphs Abs 0.4 (L) 0.7 - 4.0 K/uL   Monocytes Absolute 0.2 0.1 - 1.0 K/uL   Eosinophils Absolute 0.0 0.0 - 0.7 K/uL   Basophils Absolute 0.0 0.0 - 0.1 K/uL    WBC Morphology MILD LEFT SHIFT (1-5% METAS, OCC MYELO, OCC BANDS)    No results found.  Assessment/Plan: Diagnosis:  Right MCA infarct with history of lupus 1. Does the need for close, 24 hr/day medical supervision in concert with the patient's rehab needs make it unreasonable for this patient to be served in a less intensive setting? Yes 2. Co-Morbidities requiring supervision/potential complications: HTN, tachycardia, Lupus, ESRD, decubitus ulcer, endocarditis, anemia, leukopenia, morbid obesity 3. Due to safety, skin/wound care, disease management, medication administration and patient education, does the patient require 24 hr/day rehab nursing? Yes 4. Does the patient require coordinated care of a physician, rehab nurse, PT (1.5-2 hrs/day, 5 days/week) and OT (1.5-2 hrs/day, 5 days/week) to address physical and functional deficits in the context of the above medical diagnosis(es)? Yes Addressing deficits in the following areas: balance, endurance, locomotion, strength, transferring, bathing, dressing, grooming, toileting and psychosocial support 5. Can the patient actively participate in an intensive therapy program of at least 3 hrs of therapy per day at least 5 days per week? Potentially 6. The potential for patient to make measurable gains while on inpatient rehab is excellent and good 7. Anticipated functional outcomes upon discharge from inpatient rehab are supervision and min assist  with PT, independent and modified independent with OT, n/a with SLP. 8. Estimated rehab length of stay to reach the above functional goals is: 13-17 days. 9. Does the patient have adequate social supports and living environment to accommodate these discharge functional goals? Potentially 10. Anticipated D/C setting: Home 11. Anticipated post D/C treatments: HH therapy and Home excercise program 12. Overall Rehab/Functional Prognosis: excellent and good  RECOMMENDATIONS: [name] condition is  appropriate for continued rehabilitative care in the following setting: CIR Patient has agreed to participate in recommended program. Yes Note that insurance prior authorization may be required for reimbursement for recommended care.  Comment: Rehab Admissions Coordinator to follow up on support after discharge and stabilization of cardiac parameter.   01/02/2015

## 2015-01-02 NOTE — Progress Notes (Signed)
Physical Therapy Treatment Patient Details Name: [name] MRN: [id number] DOB: 01/19/1976 Today's Date: 01/02/2015    History of Present Illness 39 y/o female with PMH of SVT s/p ablation, anemia of chronic disease, HTN, SLE with recently diagnosed lupus nephritis who p/w dysuria * 2 days. Patient states that she recently underwent a renal biopsy on 11/22/14 and was diagnosed with lupus nephritis. The procedure was complicated by post procedure blood loss and received 2 units PRBC. She was started on cellcept and prednisone 60 mg for the lupus nephritis. She presented with dysuria, subjective fevers, nausea and 1 day of hematuria to the ED.  Pt  with new R MCA CVA as well and now on HD for renal failure.    PT Comments    Pt making good progress. Able to stand today and amb a few feet. Very motivated to progress.  Follow Up Recommendations  CIR     Equipment Recommendations  Other (comment) (TBA)    Recommendations for Other Services Rehab consult     Precautions / Restrictions Precautions Precautions: Fall Precaution Comments: Pt's femoral cath is tunneled therefore she is able to get OOB. Restrictions Weight Bearing Restrictions: No    Mobility  Bed Mobility Overal bed mobility: Needs Assistance Bed Mobility: Supine to Sit;Rolling Rolling: Supervision   Supine to sit: HOB elevated;Supervision     General bed mobility comments: Incr time and use of rail  Transfers Overall transfer level: Needs assistance Equipment used: Rolling walker (2 wheeled) Transfers: Sit to/from Stand Sit to Stand: +2 physical assistance;Min assist         General transfer comment: Assist to bring hips up.  Ambulation/Gait Ambulation/Gait assistance: +2 physical assistance;Min assist Ambulation Distance (Feet): 6 Feet Assistive device: Rolling walker (2 wheeled) Gait Pattern/deviations: Step-through pattern;Decreased step length - right;Decreased step length - left Gait velocity:  decr Gait velocity interpretation: Below normal speed for age/gender General Gait Details: Assist for balance and stability   Stairs            Wheelchair Mobility    Modified Rankin (Stroke Patients Only) Modified Rankin (Stroke Patients Only) Pre-Morbid Rankin Score: Slight disability (due to lupus) Modified Rankin: Moderately severe disability     Balance Overall balance assessment: Needs assistance Sitting-balance support: No upper extremity supported Sitting balance-Leahy Scale: Good     Standing balance support: Bilateral upper extremity supported Standing balance-Leahy Scale: Poor Standing balance comment: walker and min A for static standing                    Cognition Arousal/Alertness: Awake/alert Behavior During Therapy: WFL for tasks assessed/performed Overall Cognitive Status: Within Functional Limits for tasks assessed                      Exercises      General Comments        Pertinent Vitals/Pain Pain Assessment: No/denies pain Faces Pain Scale: Hurts little more    Home Living                      Prior Function            PT Goals (current goals can now be found in the care plan section) Acute Rehab PT Goals Patient Stated Goal: go home  PT Goal Formulation: With patient Time For Goal Achievement: 01/15/15 Potential to Achieve Goals: Good    Frequency  Min 4X/week    PT Plan Current plan remains appropriate  Co-evaluation             End of Session   Activity Tolerance: Patient tolerated treatment well Patient left: with call bell/phone within reach;in chair;Other (comment) (OT present)     Time: 4136-4383 PT Time Calculation (min) (ACUTE ONLY): 10 min  Charges:  $Gait Training: 8-22 mins                    G Codes:      Aayushi Solorzano 01-09-2015, 11:46 AM Suanne Marker PT [phone]

## 2015-01-02 NOTE — Consult Note (Signed)
January 02, 2015 Pharmacy  Pharmacy Students rounding with [facility] Service. Evaluating this patient for the potential of her use of antidepressant therapy in setting of depression being identified today upon the problem list. Complicating the choices for her antidepressant therapy is the consideration that she is on ondansetron and cyclobenzaprine concomitantly. New antidepressants such as Cymbalta and Celexa could potentiate an increased likelihood of the serotonin syndrome and was cited in a drug-drug-interaction "checker" as a "major" potential drug-drug-interaction (DDI).  The majority for the potential of DDI's stems from her concomitant use of ondansetron/Zofran. Use of promethazine HCl (Phenergan) precludes the potential of serotonin syndrome with concomitant antidepressant therapy. May give consideration to changing her antiemetic as noted-if a requirement for pharmacologic intervention is necessary for her depression.   Potentials signs and symptoms of serotonin syndrome may include:  confusion, diaphoresis, and agitation. This may commence within 24h of medication administration for medications that may potentiate this. Recommendation:  discontinue ondansetron; commence Celexa 20mg  PO every morning if the patient requires pharmacologic intervention for her depression.   Aura Fey. March Rummage, PharmD Candidate and Dierdre Harness, PharmD Candidate

## 2015-01-03 DIAGNOSIS — I638 Other cerebral infarction: Secondary | ICD-10-CM

## 2015-01-03 LAB — CBC
HCT: 27.8 % — ABNORMAL LOW (ref 36.0–46.0)
HEMOGLOBIN: 9 g/dL — AB (ref 12.0–15.0)
MCH: 30.3 pg (ref 26.0–34.0)
MCHC: 32.4 g/dL (ref 30.0–36.0)
MCV: 93.6 fL (ref 78.0–100.0)
PLATELETS: 204 10*3/uL (ref 150–400)
RBC: 2.97 MIL/uL — ABNORMAL LOW (ref 3.87–5.11)
RDW: 15.8 % — ABNORMAL HIGH (ref 11.5–15.5)
WBC: 3.4 10*3/uL — ABNORMAL LOW (ref 4.0–10.5)

## 2015-01-03 LAB — HEPARIN LEVEL (UNFRACTIONATED)
HEPARIN UNFRACTIONATED: 1.01 [IU]/mL — AB (ref 0.30–0.70)
Heparin Unfractionated: 0.54 IU/mL (ref 0.30–0.70)

## 2015-01-03 LAB — RENAL FUNCTION PANEL
ALBUMIN: 2.2 g/dL — AB (ref 3.5–5.0)
ANION GAP: 11 (ref 5–15)
BUN: 44 mg/dL — ABNORMAL HIGH (ref 6–20)
CALCIUM: 8.7 mg/dL — AB (ref 8.9–10.3)
CO2: 25 mmol/L (ref 22–32)
CREATININE: 3.84 mg/dL — AB (ref 0.44–1.00)
Chloride: 99 mmol/L — ABNORMAL LOW (ref 101–111)
GFR calc non Af Amer: 14 mL/min — ABNORMAL LOW (ref >60)
GFR, EST AFRICAN AMERICAN: 16 mL/min — AB (ref >60)
GLUCOSE: 75 mg/dL (ref 65–99)
PHOSPHORUS: 3.6 mg/dL (ref 2.5–4.6)
Potassium: 3.7 mmol/L (ref 3.5–5.1)
SODIUM: 135 mmol/L (ref 135–145)

## 2015-01-03 LAB — PROTIME-INR
INR: 1.61 — AB (ref 0.00–1.49)
PROTHROMBIN TIME: 19.2 s — AB (ref 11.6–15.2)

## 2015-01-03 MED ORDER — SODIUM CHLORIDE 0.9 % IV SOLN: 100.0000 mL | INTRAVENOUS | Status: DC | PRN

## 2015-01-03 MED ORDER — HEPARIN SODIUM (PORCINE) 1000 UNIT/ML DIALYSIS: 1000.0000 [IU] | INTRAMUSCULAR | Status: DC | PRN

## 2015-01-03 MED ORDER — HEPARIN SODIUM (PORCINE) 1000 UNIT/ML DIALYSIS: 100.0000 [IU]/kg | INTRAMUSCULAR | Status: DC | PRN

## 2015-01-03 MED ORDER — DEXTROSE 5 % IV SOLN: 1.5000 g | INTRAVENOUS | Status: AC

## 2015-01-03 MED ORDER — PENTAFLUOROPROP-TETRAFLUOROETH EX AERO: 1.0000 "application " | INHALATION_SPRAY | CUTANEOUS | Status: DC | PRN

## 2015-01-03 MED ORDER — LIDOCAINE HCL (PF) 1 % IJ SOLN: 5.0000 mL | INTRAMUSCULAR | Status: DC | PRN

## 2015-01-03 MED ORDER — LIDOCAINE-PRILOCAINE 2.5-2.5 % EX CREA: 1.0000 "application " | TOPICAL_CREAM | CUTANEOUS | Status: DC | PRN

## 2015-01-03 MED ORDER — ALTEPLASE 2 MG IJ SOLR
2.0000 mg | Freq: Once | INTRAMUSCULAR | Status: DC | PRN
  Filled 2015-01-03: qty 2

## 2015-01-03 MED ORDER — ALTEPLASE 2 MG IJ SOLR: 2.0000 mg | Freq: Once | INTRAMUSCULAR | Status: DC | PRN

## 2015-01-03 MED ORDER — HEPARIN (PORCINE) IN NACL 100-0.45 UNIT/ML-% IJ SOLN
900.0000 [IU]/h | INTRAMUSCULAR | Status: DC
  Administered 2015-01-03: 950 [IU]/h via INTRAVENOUS
  Filled 2015-01-03: qty 250

## 2015-01-03 MED ORDER — DARBEPOETIN ALFA 100 MCG/0.5ML IJ SOSY
PREFILLED_SYRINGE | INTRAMUSCULAR | Status: AC
  Filled 2015-01-03: qty 0.5

## 2015-01-03 MED ORDER — LIDOCAINE-PRILOCAINE 2.5-2.5 % EX CREA
1.0000 "application " | TOPICAL_CREAM | CUTANEOUS | Status: DC | PRN
  Filled 2015-01-03: qty 5

## 2015-01-03 MED ORDER — HEPARIN SODIUM (PORCINE) 1000 UNIT/ML DIALYSIS
100.0000 [IU]/kg | INTRAMUSCULAR | Status: DC | PRN
  Filled 2015-01-03: qty 11

## 2015-01-03 NOTE — Progress Notes (Addendum)
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 Rudene Christians). Previously on coumadin from 9/28-10/04 with no bridge or overlap therapy.   Patient Measurements: Height: 5\' 3"  (160 cm) Weight: 209 lb 7 oz (95 kg) IBW/kg (Calculated) : 52.4 Heparin Dosing Weight: 76 kg  Vital Signs: Temp: 99.7 F (37.6 C) (10/06 1400) Temp Source: Oral (10/06 1400) BP: 131/76 mmHg (10/06 1400) Pulse Rate: 118 (10/06 1400)  Labs:  Recent Labs  01/01/15 0450 01/02/15 0605 01/02/15 2214 01/03/15 0500 01/03/15 0630 01/03/15 1432  HGB 8.5* 8.6*  --  9.0*  --   --   HCT 26.7* 27.5*  --  27.8*  --   --   PLT 159 159  --  204  --   --   LABPROT 17.2* 18.9*  --  19.2*  --   --   INR 1.39 1.58*  --  1.61*  --   --   HEPARINUNFRC  --   --  0.25*  --  0.54 1.01*  CREATININE 4.50* 3.43*  --  3.84*  --   --     Estimated Creatinine Clearance: 21.8 mL/min (by C-G formula based on Cr of 3.84).  Assessment: 38yoF with R MCA stroke and mitral valve endocarditis in need of bridging therapy with heparin while coumadin is on hold for L thigh graft placement. Heparin level is above goal at 1.01. Drawn correctly per d/w RN and no heparin received in IHD (that aware of). No reports or sxs of bleeding.   Goal of Therapy:  Heparin level 0.3-0.5 units/ml Monitor platelets by anticoagulation protocol: Yes   Plan:  1. Hold heparin for 1 hour then resume at lower dose of 950 units/hr. 2. Check an 8 hour heparin level 3. Daily heparin level and CBC 4. Resume coumadin when feasible.   Vincenza Hews, PharmD, BCPS 01/03/2015, 3:50 PM Pager: [phone]

## 2015-01-03 NOTE — Progress Notes (Signed)
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 Rudene Christians). Previously on coumadin from 9/28-10/04 with no bridge or overlap therapy.   Patient Measurements: Height: 5\' 3"  (160 cm) Weight: 213 lb 6.5 oz (96.8 kg) IBW/kg (Calculated) : 52.4 Heparin Dosing Weight: 76 kg  Vital Signs: Temp: 99.5 F (37.5 C) (10/06 0522) Temp Source: Oral (10/06 0522) BP: 133/70 mmHg (10/06 0522) Pulse Rate: 99 (10/06 0522)  Labs:  Recent Labs  01/01/15 0450 01/02/15 0605 01/02/15 2214 01/03/15 0500 01/03/15 0630  HGB 8.5* 8.6*  --  9.0*  --   HCT 26.7* 27.5*  --  27.8*  --   PLT 159 159  --  204  --   LABPROT 17.2* 18.9*  --  19.2*  --   INR 1.39 1.58*  --  1.61*  --   HEPARINUNFRC  --   --  0.25*  --  0.54  CREATININE 4.50* 3.43*  --  3.84*  --     Estimated Creatinine Clearance: 22 mL/min (by C-G formula based on Cr of 3.84).  Assessment: 38yoF with R MCA stroke and mitral valve endocarditis in need of bridging therapy with heparin while coumadin is on hold for L thigh graft placement. Heparin level is slightly above goal. No bleeding noted.   Goal of Therapy:  Heparin level 0.3-0.5 units/ml Monitor platelets by anticoagulation protocol: Yes   Plan:  - Reduce heparin gtt to 1000 units/hr - Check an 8 hour heparin level - Daily heparin level and CBC  Salome Arnt, PharmD, BCPS Pager # [phone] 01/03/2015 7:29 AM

## 2015-01-03 NOTE — Progress Notes (Signed)
Spoke with patient about plan of care after discharge. Informed patient that she will receive minimal PT from [facility] after discharge, ~2-3 visits lasting 45 minutes to an hour in total, versus CIR where she would receive ~3 hours a day, and SNF where she would receive approximately that amount too. Spoke with patient about her deconditioned state and her knees "giving out" on her yesterday. Asked patient to think about how much strength she needs to be able to perform ADLs at home, her level of strength now, and how much assistance her mother would be able to give her. CM told patient that she would be at a high risk for falling going home, and that 2-3 visits from [facility] PT would not be enough with the state of weakness she is demonstrating to get her back into a physical condition where she could care for herself and her children. Reassured patient that the decision was ultimately hers, however I wanted her to have a full understanding of what to expect if she were to be discharged with limited [facility] PT as her plan for strength/ balance/  training.

## 2015-01-03 NOTE — Progress Notes (Signed)
Physical Therapy Treatment Patient Details Name: [name] MRN: [id number] DOB: 1975-04-09 Today's Date: 01/03/2015    History of Present Illness 39 y/o female with PMH of SVT s/p ablation, anemia of chronic disease, HTN, SLE with recently diagnosed lupus nephritis who p/w dysuria * 2 days. Patient states that she recently underwent a renal biopsy on 11/22/14 and was diagnosed with lupus nephritis. The procedure was complicated by post procedure blood loss and received 2 units PRBC. She was started on cellcept and prednisone 60 mg for the lupus nephritis. She presented with dysuria, subjective fevers, nausea and 1 day of hematuria to the ED.  Pt  with new R MCA CVA as well and now on HD for renal failure.    PT Comments    Pt with controlled fall yesterday which has increased pt's anxiety and decreased her confidence. Pt making steady progress and continued to reassure pt.  Follow Up Recommendations  CIR     Equipment Recommendations  Other (comment) (TBA)    Recommendations for Other Services Rehab consult     Precautions / Restrictions Precautions Precautions: Fall Precaution Comments: Pt's femoral cath is tunneled therefore she is able to get OOB. Restrictions Weight Bearing Restrictions: No    Mobility  Bed Mobility Overal bed mobility: Needs Assistance Bed Mobility: Supine to Sit;Rolling Rolling: Supervision   Supine to sit: HOB elevated;Supervision     General bed mobility comments: Incr time and use of rail  Transfers Overall transfer level: Needs assistance Equipment used: Rolling walker (2 wheeled);Ambulation equipment used Transfers: Sit to/from [facility] Sit to Stand: +2 physical assistance;Min assist Stand pivot transfers: +2 physical assistance;Min assist       General transfer comment: Assist to bring hips up. Used Stedy for pt confidence due to controlled fall yesterday when pt being assisted back to  bed.  Ambulation/Gait Ambulation/Gait assistance: +2 physical assistance;Min assist Ambulation Distance (Feet): 30 Feet Assistive device: Rolling walker (2 wheeled) Gait Pattern/deviations: Step-through pattern;Decreased step length - right;Decreased step length - left Gait velocity: decr Gait velocity interpretation: Below normal speed for age/gender General Gait Details: Assist for balance and stability   Stairs            Wheelchair Mobility    Modified Rankin (Stroke Patients Only) Modified Rankin (Stroke Patients Only) Pre-Morbid Rankin Score: Slight disability (due to lupus) Modified Rankin: Moderately severe disability     Balance     Sitting balance-Leahy Scale: Good     Standing balance support: Bilateral upper extremity supported Standing balance-Leahy Scale: Poor Standing balance comment: walker and min A for static standing.                    Cognition Arousal/Alertness: Awake/alert Behavior During Therapy: WFL for tasks assessed/performed Overall Cognitive Status: Within Functional Limits for tasks assessed                      Exercises      General Comments        Pertinent Vitals/Pain      Home Living                      Prior Function            PT Goals (current goals can now be found in the care plan section) Acute Rehab PT Goals Patient Stated Goal: go home  PT Goal Formulation: With patient Time For Goal Achievement: 01/15/15 Potential to Achieve Goals:  Good Progress towards PT goals: Progressing toward goals    Frequency  Min 4X/week    PT Plan Current plan remains appropriate    Co-evaluation             End of Session Equipment Utilized During Treatment: Gait belt (Doesn't work well due to body habitus) Activity Tolerance: Patient tolerated treatment well Patient left: with call bell/phone within reach;in chair;with chair alarm set     Time: 1456-1516 PT Time Calculation (min)  (ACUTE ONLY): 20 min  Charges:  $Gait Training: 8-22 mins                    G Codes:      Dustan Hyams 01/23/15, 4:36 PM [facility]

## 2015-01-03 NOTE — Progress Notes (Signed)
Subjective: Interval History: has no complaint .Marland Kitchen  Objective: Vital signs in last 24 hours: Temp:  [98.7 F (37.1 C)-100.3 F (37.9 C)] 100.2 F (37.9 C) (10/06 0730) Pulse Rate:  [99-123] 115 (10/06 0900) Resp:  [16-20] 16 (10/06 0900) BP: (119-148)/(61-80) 119/61 mmHg (10/06 0900) SpO2:  [98 %-100 %] 98 % (10/06 0730) Weight:  [96.8 kg (213 lb 6.5 oz)-98 kg (216 lb 0.8 oz)] 98 kg (216 lb 0.8 oz) (10/06 0730) Weight change: -6.8 kg (-14 lb 15.9 oz)  Intake/Output from previous day: 10/05 0701 - 10/06 0700 In: 20 [I.V.:20] Out: 500 [Urine:500] Intake/Output this shift: Total I/O In: -  Out: 500 [Urine:500]  General appearance: alert, cooperative and moderately obese Resp: diminished breath sounds bilaterally Cardio: regularly irregular rhythm and systolic murmur: holosystolic 2/6, blowing at apex GI: soft,pos bs, liver down 5 cm Extremities: edema 1+, fem cath on R  Lab Results:  Recent Labs  01/02/15 0605 01/03/15 0500  WBC 2.0* 3.4*  HGB 8.6* 9.0*  HCT 27.5* 27.8*  PLT 159 204   BMET:  Recent Labs  01/02/15 0605 01/03/15 0500  NA 137 135  K 4.0 3.7  CL 100* 99*  CO2 27 25  GLUCOSE 78 75  BUN 39* 44*  CREATININE 3.43* 3.84*  CALCIUM 7.9* 8.7*   No results for input(s): PTH in the last 72 hours. Iron Studies: No results for input(s): IRON, TIBC, TRANSFERRIN, FERRITIN in the last 72 hours.  Studies/Results: No results found.  I have reviewed the patient's current medications.  Assessment/Plan: 1 ESRD  Appears to have some function. Follow pre HD Cr.   Vol improving 2 HTN lower with lower vol , lower meds 3 Anemia stable to better 4 endocarditis ? Libman Saks still on AB, and also now on bridge anticoag 5 Obesity 6 CVA rehab 7 Pancytopenia resolving P Hd, Access, follow pre HD Cr.     LOS: 31 days   Ayde Record L 01/03/2015,9:24 AM

## 2015-01-03 NOTE — Procedures (Signed)
I was present at this session.  I have reviewed the session itself and made appropriate changes. Cath flow 325 now, bp tol removal.    [name] L 10/6/20169:23 AM

## 2015-01-03 NOTE — Progress Notes (Signed)
Nutrition Follow-up  DOCUMENTATION CODES:   Morbid obesity  INTERVENTION:   -Continue 30 ml Prostat daily -Continue Renal MVI -Continue between meal nourishments   NUTRITION DIAGNOSIS:   Increased nutrient needs related to chronic illness as evidenced by estimated needs.  Ongoing  GOAL:   Patient will meet greater than or equal to 90% of their needs  Progressing  MONITOR:   PO intake, Supplement acceptance, Weight trends, Labs, I & O's  REASON FOR ASSESSMENT:   Malnutrition Screening Tool    ASSESSMENT:   39 y/o female with PMH of SVT s/p ablation, anemia of chronic disease, HTN, SLE with recently diagnosed lupus nephritis who p/w dysuria * 2 days. Patient states that she recently underwent a renal biopsy on 11/22/14 and was diagnosed with lupus nephritis.  S/p Procedure(s) 12/18/14: INSERTION GORE HYBRID ARTERIOVENOUS GRAFT VS HeRO (N/A) Ligation of graft  S/p TEE on 12/25/14 revealed MV vegetation consistent with libman sacks endocarditis.   Pt not in room at time of visit. Noted meal completion improving (PO: 80-100%). Pt is taking Prostat supplement.  Per MD notes, pt will require permanent HD access prior to d/c. Permanent left thigh graft to be done, however, procedure has been delayed due to coumadin use and need to wait to 2 weeks following acute stroke before undergoing anesthesia. Per MD notes, pt is scheduled for permanent lt thigh graft placement on 01/04/15.  Reviewed [facility] note on 12/26/14; pt with two areas of stage II pressure injuries (left and rt buttocks). Pt on air mattress.  Pt has not had a BM since 12/23/14 due to immobility. Noted pt started working with PT on 01/01/15. Per RN, lack of BM is due to immobility.   RNCM following. Will d/c home with home health services once medically ready.  Diet Order:  Diet renal/carb modified with fluid restriction Diet-HS Snack?: Nothing; Room service appropriate?: Yes; Fluid consistency:: Thin Diet NPO time  specified Except for: Sips with Meds  Skin:  Wound (see comment) (stage II lt and rt buttocks)  Last BM:  12/23/14  Height:   Ht Readings from Last 1 Encounters:  12/03/14 5\' 3"  (1.6 m)    Weight:   Wt Readings from Last 1 Encounters:  01/03/15 209 lb 7 oz (95 kg)    Ideal Body Weight:  52.27 kg  BMI:  Body mass index is 37.11 kg/(m^2).  Estimated Nutritional Needs:   Kcal:  6270-3500  Protein:  110-125 grams  Fluid:  1.2 L/day  EDUCATION NEEDS:   No education needs identified at this time  Kaaliyah Kita A. Jimmye Norman, RD, LDN, CDE Pager: [phone] After hours Pager: [phone]

## 2015-01-03 NOTE — Progress Notes (Signed)
Inpatient Rehabilitation   I met with the patient at the bedside to discuss her post acute rehab needs and to provide her information regarding our IP Rehab program.  We discussed Dr. Serita Grit rehab goals for her and estimated length of stay.  I provided informational booklets and answered her questions.  Pt. says she will think about what she wants to do regarding post acute rehab.   Pt. scheduled for graft surgery  tomorrow.  My colleague Karene Fry will follow up tomorrow in my absence.  Please call if questions.   [place] [facility] Admissions Coordinator Cell [phone] Office [phone]

## 2015-01-03 NOTE — Progress Notes (Addendum)
   Subjective: Patient feeling okay today, in hemodialysis session. Objective: Vital signs in last 24 hours: Filed Vitals:   01/03/15 0930 01/03/15 1000 01/03/15 1100 01/03/15 1130  BP: 114/60 111/56 103/55 105/57  Pulse: 113 122 119 130  Temp:      TempSrc:      Resp: 18 22 24 20   Height:      Weight:      SpO2:       Weight change: -14 lb 15.9 oz (-6.8 kg)  Intake/Output Summary (Last 24 hours) at 01/03/15 1212 Last data filed at 01/03/15 0708  Gross per 24 hour  Intake     20 ml  Output   1000 ml  Net   -980 ml   General: resting in bed Cardiac: RRR, no murmur Abd: soft, nontender Neuro: alert and oriented X3   Assessment/Plan: Principal Problem:   ESRD (end stage renal disease) on dialysis [facility]) Active Problems:   Hypertension   Lupus nephritis ([place])   UTI (lower urinary tract infection)   Retroperitoneal hematoma   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 -bridging with heparin per pharm dosing, can restart coumadin after procedure -PT/INR -Continue current management and TTS HD per nephrology  Endocarditis of mitral valve: Possibly Rudene Christians, but unable to rule out infectious endocarditis as patient is immunosuppressed and leukopenic. Blood cultures were negative, however may be false as they were drawn after patient received antibiotics.  -Continue Vancomycin per pharm dosing for 3 weeks (end 10/25)  UTI: Patient with dysuria several days ago with urine culture growing pseudomonas. Likely a simple pseudomonal cystitis. Patient has no urinary symptoms at this time. Results show sensitivity to ceftazadime which we are continuing per pharm dosing with dialysis.   -Ceftazadime final dose  today  Pancytopenia: Improving off of cellcept, received 1 unit PRBCs on 9/30. Platelets improved, now at 204,000. WBC up to 3.4. -Monitor CBC  Right MCA stroke: Occurred during inpatient stay, likely embolic. Need to hold coumadin starting today for graft placement this Friday -heparin bridging per pharm dosing before graft placement on 10/7 -Continue PT  Depression: Patient states she was able to stand up by herself yesterday, as she stated was one of her goals. Some progress made and I think her mood will improve as she continues to work towards gains in her mobility.  HTN: Continue current meds    Dispo: Disposition is deferred at this time, awaiting improvement of current medical problems.       LOS: 31 days   Zada Finders, MD 01/03/2015, 12:12 PM

## 2015-01-04 ENCOUNTER — Encounter (HOSPITAL_COMMUNITY)
Admission: EM | Disposition: A | Discharge status: Rehabilitation Facility | Payer: Self-pay | Source: Home / Self Care | Attending: Internal Medicine

## 2015-01-04 ENCOUNTER — Encounter (HOSPITAL_COMMUNITY): Payer: Self-pay | Admitting: Anesthesiology

## 2015-01-04 ENCOUNTER — Inpatient Hospital Stay (HOSPITAL_COMMUNITY): Payer: Medicaid Other | Admitting: Anesthesiology

## 2015-01-04 DIAGNOSIS — N185 Chronic kidney disease, stage 5: Secondary | ICD-10-CM

## 2015-01-04 HISTORY — PX: AV FISTULA PLACEMENT: SHX1204

## 2015-01-04 LAB — HEPARIN LEVEL (UNFRACTIONATED): Heparin Unfractionated: 0.57 IU/mL (ref 0.30–0.70)

## 2015-01-04 LAB — CBC
HCT: 27.7 % — ABNORMAL LOW (ref 36.0–46.0)
HEMOGLOBIN: 8.7 g/dL — AB (ref 12.0–15.0)
MCH: 29.6 pg (ref 26.0–34.0)
MCHC: 31.4 g/dL (ref 30.0–36.0)
MCV: 94.2 fL (ref 78.0–100.0)
Platelets: 204 10*3/uL (ref 150–400)
RBC: 2.94 MIL/uL — AB (ref 3.87–5.11)
RDW: 15.9 % — ABNORMAL HIGH (ref 11.5–15.5)
WBC: 4.9 10*3/uL (ref 4.0–10.5)

## 2015-01-04 LAB — BASIC METABOLIC PANEL
ANION GAP: 10 (ref 5–15)
BUN: 25 mg/dL — AB (ref 6–20)
CHLORIDE: 101 mmol/L (ref 101–111)
CO2: 26 mmol/L (ref 22–32)
Calcium: 8.7 mg/dL — ABNORMAL LOW (ref 8.9–10.3)
Creatinine, Ser: 3.2 mg/dL — ABNORMAL HIGH (ref 0.44–1.00)
GFR calc Af Amer: 20 mL/min — ABNORMAL LOW (ref >60)
GFR calc non Af Amer: 17 mL/min — ABNORMAL LOW (ref >60)
GLUCOSE: 78 mg/dL (ref 65–99)
POTASSIUM: 4.1 mmol/L (ref 3.5–5.1)
SODIUM: 137 mmol/L (ref 135–145)

## 2015-01-04 LAB — PROTIME-INR
INR: 1.7 — ABNORMAL HIGH (ref 0.00–1.49)
Prothrombin Time: 19.9 seconds — ABNORMAL HIGH (ref 11.6–15.2)

## 2015-01-04 LAB — HCG, SERUM, QUALITATIVE: PREG SERUM: NEGATIVE

## 2015-01-04 SURGERY — INSERTION OF ARTERIOVENOUS (AV) GORE-TEX GRAFT THIGH
Anesthesia: Monitor Anesthesia Care | Site: Leg Upper | Laterality: Left

## 2015-01-04 MED ORDER — WARFARIN SODIUM 5 MG PO TABS
5.0000 mg | ORAL_TABLET | Freq: Once | ORAL | Status: AC
Start: 2015-01-04 — End: 2015-01-04
  Administered 2015-01-04: 5 mg via ORAL
  Filled 2015-01-04: qty 1

## 2015-01-04 MED ORDER — METOPROLOL SUCCINATE ER 25 MG PO TB24
25.0000 mg | ORAL_TABLET | Freq: Every day | ORAL | Status: DC
  Administered 2015-01-04: 25 mg via ORAL
  Filled 2015-01-04: qty 1

## 2015-01-04 MED ORDER — MEPERIDINE HCL 25 MG/ML IJ SOLN: 6.2500 mg | INTRAMUSCULAR | Status: DC | PRN

## 2015-01-04 MED ORDER — DEXTROSE 5 % IV SOLN
1.5000 g | INTRAVENOUS | Status: AC
  Administered 2015-01-04: 1.5 g via INTRAVENOUS
  Filled 2015-01-04: qty 1.5

## 2015-01-04 MED ORDER — SODIUM CHLORIDE 0.9 % IV SOLN
INTRAVENOUS | Status: DC | PRN
  Administered 2015-01-04: 500 mL

## 2015-01-04 MED ORDER — ALTEPLASE 2 MG IJ SOLR
2.0000 mg | Freq: Once | INTRAMUSCULAR | Status: AC
  Administered 2015-01-04: 2 mg
  Filled 2015-01-04: qty 2

## 2015-01-04 MED ORDER — LIDOCAINE HCL (CARDIAC) 20 MG/ML IV SOLN
INTRAVENOUS | Status: AC
  Filled 2015-01-04: qty 5

## 2015-01-04 MED ORDER — BISACODYL 10 MG RE SUPP: 10.0000 mg | Freq: Every day | RECTAL | Status: DC | PRN

## 2015-01-04 MED ORDER — PROPOFOL 10 MG/ML IV BOLUS
INTRAVENOUS | Status: AC
  Filled 2015-01-04: qty 20

## 2015-01-04 MED ORDER — DEXAMETHASONE SODIUM PHOSPHATE 4 MG/ML IJ SOLN
INTRAMUSCULAR | Status: DC | PRN
  Administered 2015-01-04: 8 mg via INTRAVENOUS

## 2015-01-04 MED ORDER — PHENYLEPHRINE HCL 10 MG/ML IJ SOLN
INTRAMUSCULAR | Status: DC | PRN
  Administered 2015-01-04: 80 ug via INTRAVENOUS

## 2015-01-04 MED ORDER — ROCURONIUM BROMIDE 50 MG/5ML IV SOLN
INTRAVENOUS | Status: AC
  Filled 2015-01-04: qty 1

## 2015-01-04 MED ORDER — PROTAMINE SULFATE 10 MG/ML IV SOLN
INTRAVENOUS | Status: DC | PRN
  Administered 2015-01-04: 20 mg via INTRAVENOUS
  Administered 2015-01-04: 10 mg via INTRAVENOUS
  Administered 2015-01-04: 20 mg via INTRAVENOUS

## 2015-01-04 MED ORDER — AMLODIPINE BESYLATE 5 MG PO TABS
5.0000 mg | ORAL_TABLET | Freq: Every day | ORAL | Status: DC
  Administered 2015-01-04: 5 mg via ORAL
  Filled 2015-01-04: qty 1

## 2015-01-04 MED ORDER — WARFARIN - PHARMACIST DOSING INPATIENT: Freq: Every day | Status: DC

## 2015-01-04 MED ORDER — PROPOFOL 10 MG/ML IV BOLUS
INTRAVENOUS | Status: DC | PRN
  Administered 2015-01-04: 150 mg via INTRAVENOUS

## 2015-01-04 MED ORDER — 0.9 % SODIUM CHLORIDE (POUR BTL) OPTIME
TOPICAL | Status: DC | PRN
  Administered 2015-01-04: 1000 mL

## 2015-01-04 MED ORDER — ONDANSETRON HCL 4 MG/2ML IJ SOLN
INTRAMUSCULAR | Status: AC
  Filled 2015-01-04: qty 2

## 2015-01-04 MED ORDER — FENTANYL CITRATE (PF) 250 MCG/5ML IJ SOLN
INTRAMUSCULAR | Status: AC
Start: 2015-01-04 — End: 2015-01-04
  Filled 2015-01-04: qty 5

## 2015-01-04 MED ORDER — DEXAMETHASONE SODIUM PHOSPHATE 4 MG/ML IJ SOLN
INTRAMUSCULAR | Status: AC
  Filled 2015-01-04: qty 2

## 2015-01-04 MED ORDER — HYDROMORPHONE HCL 1 MG/ML IJ SOLN: 0.2500 mg | INTRAMUSCULAR | Status: DC | PRN

## 2015-01-04 MED ORDER — ONDANSETRON HCL 4 MG/2ML IJ SOLN
INTRAMUSCULAR | Status: DC | PRN
  Administered 2015-01-04: 4 mg via INTRAVENOUS

## 2015-01-04 MED ORDER — HEPARIN SODIUM (PORCINE) 1000 UNIT/ML IJ SOLN
INTRAMUSCULAR | Status: DC | PRN
  Administered 2015-01-04: 4000 [IU] via INTRAVENOUS

## 2015-01-04 MED ORDER — SENNOSIDES-DOCUSATE SODIUM 8.6-50 MG PO TABS
2.0000 | ORAL_TABLET | Freq: Once | ORAL | Status: DC
  Filled 2015-01-04: qty 2

## 2015-01-04 MED ORDER — FENTANYL CITRATE (PF) 100 MCG/2ML IJ SOLN
INTRAMUSCULAR | Status: DC | PRN
  Administered 2015-01-04: 25 ug via INTRAVENOUS
  Administered 2015-01-04: 50 ug via INTRAVENOUS
  Administered 2015-01-04: 25 ug via INTRAVENOUS
  Administered 2015-01-04 (×2): 50 ug via INTRAVENOUS

## 2015-01-04 MED ORDER — ONDANSETRON HCL 4 MG/2ML IJ SOLN: 4.0000 mg | Freq: Once | INTRAMUSCULAR | Status: DC | PRN

## 2015-01-04 MED ORDER — HEPARIN SODIUM (PORCINE) 1000 UNIT/ML IJ SOLN
INTRAMUSCULAR | Status: AC
  Filled 2015-01-04: qty 1

## 2015-01-04 MED ORDER — MIDAZOLAM HCL 5 MG/5ML IJ SOLN
INTRAMUSCULAR | Status: DC | PRN
  Administered 2015-01-04 (×2): 1 mg via INTRAVENOUS

## 2015-01-04 MED ORDER — MIDAZOLAM HCL 2 MG/2ML IJ SOLN
INTRAMUSCULAR | Status: AC
  Filled 2015-01-04: qty 4

## 2015-01-04 MED ORDER — THROMBIN 20000 UNITS EX SOLR
CUTANEOUS | Status: AC
  Filled 2015-01-04: qty 20000

## 2015-01-04 SURGICAL SUPPLY — 44 items
APL SKNCLS STERI-STRIP NONHPOA (GAUZE/BANDAGES/DRESSINGS) ×1
BENZOIN TINCTURE PRP APPL 2/3 (GAUZE/BANDAGES/DRESSINGS) ×3 IMPLANT
CANISTER SUCTION 2500CC (MISCELLANEOUS) ×3 IMPLANT
CANNULA VESSEL 3MM 2 BLNT TIP (CANNULA) IMPLANT
CLIP LIGATING EXTRA MED SLVR (CLIP) ×3 IMPLANT
CLIP LIGATING EXTRA SM BLUE (MISCELLANEOUS) ×3 IMPLANT
CLOSURE STERI-STRIP 1/2X4 (GAUZE/BANDAGES/DRESSINGS) ×1
CLOSURE WOUND 1/2 X4 (GAUZE/BANDAGES/DRESSINGS) ×1
CLSR STERI-STRIP ANTIMIC 1/2X4 (GAUZE/BANDAGES/DRESSINGS) ×1 IMPLANT
DECANTER SPIKE VIAL GLASS SM (MISCELLANEOUS) IMPLANT
DRAPE INCISE IOBAN 66X45 STRL (DRAPES) ×6 IMPLANT
DRAPE ORTHO SPLIT 77X108 STRL (DRAPES) ×3
DRAPE SURG ORHT 6 SPLT 77X108 (DRAPES) IMPLANT
ELECT REM PT RETURN 9FT ADLT (ELECTROSURGICAL) ×3
ELECTRODE REM PT RTRN 9FT ADLT (ELECTROSURGICAL) ×1 IMPLANT
GAUZE SPONGE 4X4 12PLY STRL (GAUZE/BANDAGES/DRESSINGS) ×3 IMPLANT
GEL ULTRASOUND 20GR AQUASONIC (MISCELLANEOUS) IMPLANT
GLOVE BIO SURGEON STRL SZ 6.5 (GLOVE) ×1 IMPLANT
GLOVE BIO SURGEON STRL SZ7.5 (GLOVE) ×2 IMPLANT
GLOVE BIO SURGEONS STRL SZ 6.5 (GLOVE) ×1
GLOVE BIOGEL PI IND STRL 6.5 (GLOVE) IMPLANT
GLOVE BIOGEL PI IND STRL 7.0 (GLOVE) IMPLANT
GLOVE BIOGEL PI INDICATOR 6.5 (GLOVE) ×2
GLOVE BIOGEL PI INDICATOR 7.0 (GLOVE) ×6
GLOVE SS BIOGEL STRL SZ 7.5 (GLOVE) ×1 IMPLANT
GLOVE SUPERSENSE BIOGEL SZ 7.5 (GLOVE) ×2
GOWN STRL REUS W/ TWL LRG LVL3 (GOWN DISPOSABLE) ×3 IMPLANT
GOWN STRL REUS W/TWL LRG LVL3 (GOWN DISPOSABLE) ×9
GRAFT GORETEX 6X70 (Vascular Products) ×2 IMPLANT
KIT BASIN OR (CUSTOM PROCEDURE TRAY) ×3 IMPLANT
KIT ROOM TURNOVER OR (KITS) ×3 IMPLANT
NS IRRIG 1000ML POUR BTL (IV SOLUTION) ×3 IMPLANT
PACK CV ACCESS (CUSTOM PROCEDURE TRAY) ×3 IMPLANT
PAD ARMBOARD 7.5X6 YLW CONV (MISCELLANEOUS) ×6 IMPLANT
STRIP CLOSURE SKIN 1/2X4 (GAUZE/BANDAGES/DRESSINGS) ×2 IMPLANT
SUT PROLENE 6 0 CC (SUTURE) ×8 IMPLANT
SUT SILK 2 0 FS (SUTURE) IMPLANT
SUT VIC AB 2-0 CT1 27 (SUTURE) ×3
SUT VIC AB 2-0 CT1 TAPERPNT 27 (SUTURE) ×1 IMPLANT
SUT VIC AB 3-0 SH 27 (SUTURE) ×6
SUT VIC AB 3-0 SH 27X BRD (SUTURE) ×2 IMPLANT
TAPE CLOTH SURG 4X10 WHT LF (GAUZE/BANDAGES/DRESSINGS) ×2 IMPLANT
UNDERPAD 30X30 INCONTINENT (UNDERPADS AND DIAPERS) ×3 IMPLANT
WATER STERILE IRR 1000ML POUR (IV SOLUTION) ×3 IMPLANT

## 2015-01-04 NOTE — Op Note (Signed)
    OPERATIVE REPORT  DATE OF SURGERY: 01/04/2015  PATIENT: [name], 38 y.o. female MRN: [id number]  DOB: 08-Aug-1975  PRE-OPERATIVE DIAGNOSIS: End-stage renal disease  POST-OPERATIVE DIAGNOSIS:  Same  PROCEDURE: Left femoral loop AV Gore-Tex graft  SURGEON:  Curt Jews, M.D.  PHYSICIAN ASSISTANT: Collins  ANESTHESIA:  Gen.  EBL: Minimal ml  Total I/O In: 500 [I.V.:500] Out: -   BLOOD ADMINISTERED: None  DRAINS: None  SPECIMEN: None  COUNTS CORRECT:  YES  PLAN OF CARE: PACU   PATIENT DISPOSITION:  PACU - hemodynamically stable  PROCEDURE DETAILS: Patient was taken to the operating placed supine position where the area of the left groin left thyroid prepped and draped in usual sterile fashion patient had a large panniculus which was retracted superiorly. Incision was made over the common femoral pulse. The saphenous vein was identified at the common femoral vein and saphenofemoral junction. The common femoral vein was exposed further proximally. The superficial femoral artery and no evidence of calcification and was encircled with a vessel loop. 2 small separate incisions were made over the distal thigh and a loop configuration tunnel was created. A 6 mm standard wall graft was brought through the tunnel. The patient was given 5000 units intravenous heparin. After adequate circulation time the common femoral vein was occluded under the inguinal ligament and just above the saphenofemoral junction. The vein was opened on the anterior surface with an 11 blade incision longitudinally with Potts scissors. The graft was spatulated and sewn end-to-side to the ER vein with a running 60 proline suture. Clamps removed and the graft was flushed Apresoline and reoccluded. Next the superficial femoral artery was occluded proximally and distally was opened with 11 blade incision longitudinally with Potts scissors. The graft was transected and was sewn end-to-side to the superficial  femoral artery with a running 60 proline suture. Prior to completion of the anastomosis the usual flushing maneuvers were undertaken. Anastomosis completed and clamps removed restoring flow to the graft. The patient was given 50 mg of protamine to reverse heparin. Richard with saline. Hemostasis tablet cautery. Wounds were closed with 3-0 Monocryl in the subcutaneous and subcuticular tissue. Service was applied the patient transferred to the recovery room stable condition   Curt Jews, M.D. 01/04/2015 12:08 PM

## 2015-01-04 NOTE — Progress Notes (Signed)
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 Rudene Christians). Previously on coumadin from 9/28-10/04 with no bridge or overlap therapy.   Patient Measurements: Height: 5\' 3"  (160 cm) Weight: 209 lb 7 oz (95 kg) IBW/kg (Calculated) : 52.4 Heparin Dosing Weight: 76 kg  Vital Signs: Temp: 99.6 F (37.6 C) (10/06 2234) Temp Source: Oral (10/06 2234) BP: 110/65 mmHg (10/06 2234) Pulse Rate: 109 (10/06 2234)  Labs:  Recent Labs  01/01/15 0450 01/02/15 0605  01/03/15 0500 01/03/15 0630 01/03/15 1432 01/04/15 0104  HGB 8.5* 8.6*  --  9.0*  --   --  8.7*  HCT 26.7* 27.5*  --  27.8*  --   --  27.7*  PLT 159 159  --  204  --   --  204  LABPROT 17.2* 18.9*  --  19.2*  --   --  19.9*  INR 1.39 1.58*  --  1.61*  --   --  1.70*  HEPARINUNFRC  --   --   < >  --  0.54 1.01* 0.57  CREATININE 4.50* 3.43*  --  3.84*  --   --   --   < > = values in this interval not displayed.  Estimated Creatinine Clearance: 21.8 mL/min (by C-G formula based on Cr of 3.84).  Assessment: 38yoF with R MCA stroke and mitral valve endocarditis in need of bridging therapy with heparin while coumadin is on hold for L thigh graft placement. Heparin level remains slightly above goal at 0.57. No reports or sxs of bleeding.   Goal of Therapy:  Heparin level 0.3-0.5 units/ml Monitor platelets by anticoagulation protocol: Yes   Plan:  - Reduce heparin gtt to 900 units/hr - Check an 8 hour heparin level  Salome Arnt, PharmD, BCPS Pager # [phone] 01/04/2015 2:27 AM

## 2015-01-04 NOTE — Progress Notes (Signed)
Physical Therapy Treatment Patient Details Name: [name] MRN: [id number] DOB: Oct 16, 1975 Today's Date: 01/04/2015    History of Present Illness 39 y/o female with PMH of SVT s/p ablation, anemia of chronic disease, HTN, SLE with recently diagnosed lupus nephritis who p/w dysuria * 2 days. Patient states that she recently underwent a renal biopsy on 11/22/14 and was diagnosed with lupus nephritis. The procedure was complicated by post procedure blood loss and received 2 units PRBC. She was started on cellcept and prednisone 60 mg for the lupus nephritis. She presented with dysuria, subjective fevers, nausea and 1 day of hematuria to the ED.  Pt  with new R MCA CVA as well and now on HD for renal failure.    PT Comments    Pt making good progress and remains very motivated. However she continues to lack the power needed for more independent sit to stand. She also fatigues quickly and don't feel she can currently sustain this level of mobility throughout the day. She is also having permanent HD access placed today. For all of these reasons continue to feel she can benefit from CIR stay.  Follow Up Recommendations  CIR     Equipment Recommendations  Other (comment) (TBA)    Recommendations for Other Services       Precautions / Restrictions Precautions Precautions: Fall Precaution Comments: Pt's femoral cath is tunneled therefore she is able to get OOB. Restrictions Weight Bearing Restrictions: No    Mobility  Bed Mobility Overal bed mobility: Needs Assistance Bed Mobility: Supine to Sit;Rolling Rolling: Supervision   Supine to sit: HOB elevated;Supervision     General bed mobility comments: Incr time and use of rail  Transfers Overall transfer level: Needs assistance Equipment used: Rolling walker (2 wheeled) Transfers: Sit to/from Stand Sit to Stand: +2 physical assistance;Min assist         General transfer comment: Assist to bring hips  up.  Ambulation/Gait Ambulation/Gait assistance: +2 physical assistance;Min assist Ambulation Distance (Feet): 80 Feet (x 2) Assistive device: Rolling walker (2 wheeled) Gait Pattern/deviations: Step-through pattern;Decreased step length - right;Decreased step length - left;Staggering left Gait velocity: decr Gait velocity interpretation: Below normal speed for age/gender General Gait Details: Assist for balance and stability   Stairs            Wheelchair Mobility    Modified Rankin (Stroke Patients Only) Modified Rankin (Stroke Patients Only) Pre-Morbid Rankin Score: Slight disability (due to lupus) Modified Rankin: Moderately severe disability     Balance Overall balance assessment: Needs assistance Sitting-balance support: No upper extremity supported;Feet supported Sitting balance-Leahy Scale: Good     Standing balance support: Bilateral upper extremity supported Standing balance-Leahy Scale: Poor Standing balance comment: walker and min A                    Cognition Arousal/Alertness: Awake/alert Behavior During Therapy: WFL for tasks assessed/performed Overall Cognitive Status: Within Functional Limits for tasks assessed                      Exercises      General Comments        Pertinent Vitals/Pain Pain Assessment: No/denies pain    Home Living                      Prior Function            PT Goals (current goals can now be found in the care plan section)  Acute Rehab PT Goals Patient Stated Goal: go home  PT Goal Formulation: With patient Time For Goal Achievement: 01/15/15 Potential to Achieve Goals: Good Progress towards PT goals: Progressing toward goals    Frequency  Min 4X/week    PT Plan Current plan remains appropriate    Co-evaluation             End of Session Equipment Utilized During Treatment:  (Gait belt not effective due to body habitus) Activity Tolerance: Patient tolerated treatment  well Patient left: with call bell/phone within reach;in bed;with family/visitor present     Time: 1916-6060 PT Time Calculation (min) (ACUTE ONLY): 18 min  Charges:  $Gait Training: 8-22 mins                    G Codes:      Gareld Obrecht Jan 28, 2015, 9:59 AM [employer] PT [phone]

## 2015-01-04 NOTE — H&P (View-Only) (Signed)
      Per Dr. Trula Slade we plan left thigh graft placement for HD Friday 01/04/2015 by Dr. Donnetta Hutching.  Coumadin will be held starting today and pharmacy will dose heparin until after surgery and coumadin can be restarted.  I spoke with Dr. Posey Pronto on the phone regarding this plan.  Thank you for the help.  Awilda Covin MAUREEN PA-C

## 2015-01-04 NOTE — Anesthesia Postprocedure Evaluation (Signed)
Anesthesia Post Note  Patient: [name]  Procedure(s) Performed: Procedure(s) (LRB): INSERTION OF ARTERIOVENOUS (AV) GORE-TEX GRAFT THIGH (Left)  Anesthesia type: general  Patient location: PACU  Post pain: Pain level controlled  Post assessment: Patient's Cardiovascular Status Stable  Last Vitals:  Filed Vitals:   01/04/15 1213  BP: 130/71  Pulse: 102  Temp: 37.7 C  Resp: 14    Post vital signs: Reviewed and stable  Level of consciousness: sedated  Complications: No apparent anesthesia complications

## 2015-01-04 NOTE — Discharge Summary (Signed)
Name: [name] MRN: [id number] DOB: 1975/05/20 39 y.o. PCP: Lin Landsman, MD  Date of Admission: 12/03/2014 11:00 AM Date of Discharge: 01/05/2015 Attending Physician: Michel Bickers, MD  Discharge Diagnosis: 1. 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     alendronate 70 MG tablet  Commonly known as:  FOSAMAX  Take 70 mg by mouth once a week. Take on Wednesdays with a full glass of water on an empty stomach.     amLODipine 5 MG tablet  Commonly known as:  NORVASC  Take 1 tablet (5 mg total) by mouth at bedtime.     aspirin 81 MG EC tablet  Take 1 tablet (81 mg total) by mouth daily.     atorvastatin 20 MG tablet  Commonly known as:  LIPITOR  Take 1 tablet (20 mg total) by mouth daily at 6 PM.     CELLCEPT 500 MG tablet  Generic drug:  mycophenolate  Take 1,500 mg by mouth 2 (two) times daily.     cyclobenzaprine 10 MG tablet  Commonly known as:  FLEXERIL  Take 10 mg by mouth at bedtime as needed for muscle spasms.     folic acid 1 MG tablet  Commonly known as:  FOLVITE  Take 1 tablet (1 mg total) by mouth daily.     hydroxychloroquine 200 MG tablet  Commonly known as:  PLAQUENIL  Take 2 tablets (400 mg  total) by mouth daily.     isosorbide mononitrate 30 MG 24 hr tablet  Commonly known as:  IMDUR  Take 1 tablet (30 mg total) by mouth daily.     metoprolol succinate 50 MG 24 hr tablet  Commonly known as:  TOPROL-XL  Take 1 tablet (50 mg total) by mouth daily.     multivitamin Tabs tablet  Take 1 tablet by mouth at bedtime.     ondansetron 4 MG disintegrating tablet  Commonly known as:  ZOFRAN ODT  107m ODT q4 hours prn nausea/vomit     oxyCODONE-acetaminophen 5-325 MG tablet  Commonly known as:  PERCOCET  Take 1 tablet by mouth every 4 (four) hours as needed for severe pain.     pantoprazole 20 MG tablet  Commonly known as:  PROTONIX  Take 1 tablet (20 mg total) by mouth daily.     predniSONE 20 MG tablet  Commonly known as:  DELTASONE  Take 20 mg by mouth 3 (three) times daily.     TUMS PO  Take 1-2 tablets by mouth daily as needed (heartburn).     vancomycin 1 GM/200ML Soln  Commonly known as:  VANCOCIN  Inject 200 mLs (1,000 mg total) into the vein Every Tuesday,Thursday,and Saturday with dialysis.     warfarin 5 MG tablet  Commonly known as:  COUMADIN  Take 1 tablet (5 mg total) by mouth one time only at 6 PM.        Disposition and follow-up:   [name] was discharged from [facility] in Stable condition to inpatient rehabilitation.  At the hospital follow up visit please address:  1. ESRD on HD: S/p left thigh permanent access placement on 01/04/15. Allow 4 weeks for graft to mature. Has temporary right thigh catheter for dialysis until then. Please assess for any bleeding or sign of infection at graft site. Continue TTS HD schedule as per Nephrology.  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 Vancomycin to continue until end date of 01/22/15.   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 Recommendation for a target INR of 2.0-2.5 for now and can discontinue ASA once INR therapeutic.  Pancytopenia: Patient's pancytopenia has steadily improved off of Cellcept, will need to f/u with CBC.  Hypertension: Please assess patient's blood pressures on current regimen of Imdur, Metoprolol, Hydralazine, and Amlodipine.  Right MCA CVA: Please assess for further symptoms of left sided weakness and any residual symptoms.  Depression: Patient had depressed mood due to long-term stay in hospital and being away from her children. Please reassess her mood as she progresses in her health and activity out of the hospital.   2.  Labs / imaging needed at time of follow-up: f/u Echo in 6 weeks to 3 months (for reassesment of valvular vegetation), CBC, PT/INR, Renal Function Panel  3.  Pending labs/ test needing follow-up: none  Follow-up Appointments: Follow-up Information    Schedule an appointment as soon as possible for a visit with Estanislado Emms, MD.   Specialty:  Nephrology   Why:  Lupus nephritis management   Contact information:   [place] [address] [phone]       Schedule an appointment as soon as possible for a visit with Kristine Garbe, MD.   Specialty:  Family Medicine   Why:  _0 :10, check UTI symptoms for improvement, check blood pressure   Contact information:   [address] [place] [address] [phone]       Schedule an appointment as soon as possible for a visit with [facility].   Why:  Your dialysis will be Tuesday, Thursday, Saturday at noon. Your first session is on Thursday. Please show up at 11:30AM to complete paperwork.   Contact information:   [address] [place], [place] [address]      Follow up with Xu,Jindong, MD. Schedule an appointment as soon  as possible for a visit in 2 months.   Specialty:  Neurology   Why:  stroke clinic   Contact information:   [address] [name] [address] [phone]       Follow up with Adrian Prows, MD.   Specialty:  Cardiology   Contact information:   [address] [place]  [place] [address] [phone]       Discharge Instructions: Discharge Instructions    Ambulatory referral to Neurology    Complete by:  As directed   Pt will follow up with Dr. Erlinda Hong at [facility] in about 2 months. Thanks.     Call MD for:  difficulty breathing, headache or visual disturbances    Complete by:  As directed      Call MD for:  extreme fatigue    Complete by:  As directed      Call MD for:  persistant dizziness or light-headedness    Complete by:  As directed      Call MD for:  persistant nausea and vomiting    Complete by:  As directed      Call MD for:  redness, tenderness, or signs of infection (pain, swelling, redness, odor or green/yellow discharge around incision site)    Complete by:  As directed      Call MD for:  severe uncontrolled pain    Complete by:  As directed      Call MD for:  temperature >100.4    Complete by:  As directed      Diet - low sodium heart healthy    Complete by:  As directed      Increase activity slowly    Complete by:  As directed      Increase activity slowly    Complete by:  As directed            Consultations: Treatment Team:  Serafina Mitchell, MD Jamal Maes, MD Annia Belt, MD  Procedures Performed:  Ct Head Wo Contrast  12/21/2014   CLINICAL DATA:  Code stroke, headache, eye pain, left-sided weakness  EXAM: CT HEAD WITHOUT CONTRAST  TECHNIQUE: Contiguous axial images were obtained from the base of the skull through the vertex without intravenous contrast.  COMPARISON:  None.  FINDINGS: No evidence of parenchymal hemorrhage or extra-axial fluid collection. No mass lesion, mass effect, or midline shift.  No CT evidence  of acute infarction.  Cerebral volume is within normal limits.  No ventriculomegaly.  Partial opacification of the right sphenoid sinus. Visualized paranasal sinuses are otherwise essentially clear. The mastoid air cells are unopacified.  No evidence of calvarial fracture.  IMPRESSION: Normal head CT.  These results were called by telephone at the time of interpretation on 12/21/2014 at 12:04 pm to Dr. Alexis Goodell, who verbally acknowledged these results.   Electronically Signed   By: Julian Hy M.D.   On: 12/21/2014 12:06   Ct Angio Neck W/cm &/or Wo/cm  12/24/2014   CLINICAL DATA:  Recent RIGHT brain infarct. Unable to adequately visualize RICA/Right carotid bifurcation on ultrasound.  EXAM: CT ANGIOGRAPHY NECK  TECHNIQUE: Multidetector CT imaging of the neck was performed using the standard protocol during bolus administration of intravenous contrast. Multiplanar CT image reconstructions and MIPs were obtained to evaluate the vascular anatomy. Carotid stenosis measurements (when applicable) are obtained utilizing NASCET criteria, using the distal internal carotid diameter as the denominator.  CONTRAST:  15m OMNIPAQUE IOHEXOL 350 MG/ML SOLN  COMPARISON:  MR brain 12/21/2014.  FINDINGS: Aortic arch: Standard branching. Imaged portion shows no evidence of aneurysm or dissection. No significant stenosis of the major arch vessel origins.  Right carotid system: Extreme dolichoectasia. Retropharyngeal RIGHT ICA location explains difficulty of sonographic visualization. No evidence of dissection, stenosis (50% or greater) or occlusion.  Left carotid system: Extreme dolichoectasia. No evidence of dissection, stenosis (50% or  greater) or occlusion.  Vertebral arteries: Codominant. No evidence of dissection, stenosis (50% or greater) or occlusion.  Skeleton: Mild spondylosis.  No osseous lesion.  Other neck: Vascular catheter RIGHT IJ approach with tips in the SVC, appears uncomplicated. Moderate soft tissue  stranding over the anterior chest with a prominent pectoral lymph node. Unremarkable thyroid. Negative intracranial compartment. Dysconjugate gaze. No sinus or mastoid fluid. BILATERAL IJ patency.  IMPRESSION: No extracranial cerebral vascular disease is evident.  Extreme dolichoectasia of the carotid system, with a retropharyngeal RIGHT ICA, explains difficulty of sonographic visualization.   Electronically Signed   By: Staci Righter M.D.   On: 12/24/2014 15:13   Mr Virgel Paling Wo Contrast  12/21/2014   CLINICAL DATA:  39 year old female with altered mental status, left facial droop and left side weakness. Code stroke. Initial encounter.  EXAM: MRI HEAD WITHOUT CONTRAST  MRA HEAD WITHOUT CONTRAST  TECHNIQUE: Multiplanar, multiecho pulse sequences of the brain and surrounding structures were obtained without intravenous contrast. Angiographic images of the head were obtained using MRA technique without contrast.  COMPARISON:  Head CT without contrast 1200 hr today.  FINDINGS: MRI HEAD FINDINGS  Cortical and subcortical white matter restricted diffusion in the anterior right MCA territory including involvement of the operculum and anterior 2/3 of the insula. Cortical involvement tracks cephalad toward the vertex, involving some of the pre motor area (series 3, image 33). There is early T2 and FLAIR hyperintensity in the insula. There are asymmetrically visible right MCA sylvian branches. See MRA findings below.  No contralateral left hemisphere or posterior fossa restricted diffusion. Major intracranial vascular flow voids are preserved.  No associated hemorrhage or mass effect. Partially empty sella. Pearline Cables and white matter signal elsewhere is within normal limits. No midline shift, mass effect, evidence of mass lesion, ventriculomegaly, extra-axial collection or acute intracranial hemorrhage. Cervicomedullary junction and pituitary are within normal limits. Negative visualized cervical spine.  Trace mastoid fluid.  Mild mucosal thickening and/or mucous retention cysts in the maxillary and right sphenoid sinuses. Rightward gaze deviation. Negative scalp soft tissues. Visualized bone marrow signal is within normal limits.  MRA HEAD FINDINGS  Antegrade flow in the posterior circulation with codominant distal vertebral arteries. Patent PICA origins. Incidental fenestrated basilar artery. No basilar stenosis. AICA, SCA, and PCA origins are normal. Bilateral PCA branches are within normal limits.  Antegrade flow in both ICA siphons. No siphon stenosis or irregularity. Ophthalmic artery origins are within normal limits. Posterior communicating arteries are diminutive or absent. Carotid termini are patent. MCA and ACA origins are normal. Diminutive or absent anterior communicating artery. Visualized bilateral ACA branches and visualized left MCA branches are within normal limits.  The right MCA M1 segment and bifurcation are patent. However, there is occlusion of the anterior right MCA M2 division just beyond its origin (series 603, image 4). The other right MCA branches appear within normal limits.  IMPRESSION: 1. 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 No hemorrhage or mass effect. 2. Otherwise negative intracranial MRI and MRA. Study discussed by telephone with Dr. Armida Sans on 12/21/2014 at 1939 hrs.   Electronically Signed   By: Genevie Ann M.D.   On: 12/21/2014 19:46   Mr Brain Wo Contrast  12/21/2014   CLINICAL DATA:  39 year old female with altered mental status, left facial droop and left side weakness. Code stroke. Initial encounter.  EXAM: MRI HEAD WITHOUT CONTRAST  MRA HEAD WITHOUT CONTRAST  TECHNIQUE: Multiplanar, multiecho pulse sequences of the  brain and surrounding structures were obtained without intravenous contrast. Angiographic images of the head were obtained using MRA technique without contrast.  COMPARISON:  Head CT without contrast 1200 hr today.  FINDINGS: MRI HEAD  FINDINGS  Cortical and subcortical white matter restricted diffusion in the anterior right MCA territory including involvement of the operculum and anterior 2/3 of the insula. Cortical involvement tracks cephalad toward the vertex, involving some of the pre motor area (series 3, image 33). There is early T2 and FLAIR hyperintensity in the insula. There are asymmetrically visible right MCA sylvian branches. See MRA findings below.  No contralateral left hemisphere or posterior fossa restricted diffusion. Major intracranial vascular flow voids are preserved.  No associated hemorrhage or mass effect. Partially empty sella. Pearline Cables and white matter signal elsewhere is within normal limits. No midline shift, mass effect, evidence of mass lesion, ventriculomegaly, extra-axial collection or acute intracranial hemorrhage. Cervicomedullary junction and pituitary are within normal limits. Negative visualized cervical spine.  Trace mastoid fluid. Mild mucosal thickening and/or mucous retention cysts in the maxillary and right sphenoid sinuses. Rightward gaze deviation. Negative scalp soft tissues. Visualized bone marrow signal is within normal limits.  MRA HEAD FINDINGS  Antegrade flow in the posterior circulation with codominant distal vertebral arteries. Patent PICA origins. Incidental fenestrated basilar artery. No basilar stenosis. AICA, SCA, and PCA origins are normal. Bilateral PCA branches are within normal limits.  Antegrade flow in both ICA siphons. No siphon stenosis or irregularity. Ophthalmic artery origins are within normal limits. Posterior communicating arteries are diminutive or absent. Carotid termini are patent. MCA and ACA origins are normal. Diminutive or absent anterior communicating artery. Visualized bilateral ACA branches and visualized left MCA branches are within normal limits.  The right MCA M1 segment and bifurcation are patent. However, there is occlusion of the anterior right MCA M2 division just  beyond its origin (series 603, image 4). The other right MCA branches appear within normal limits.  IMPRESSION: 1. 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 No hemorrhage or mass effect. 2. Otherwise negative intracranial MRI and MRA. Study discussed by telephone with Dr. Armida Sans on 12/21/2014 at 1939 hrs.   Electronically Signed   By: Genevie Ann M.D.   On: 12/21/2014 19:46   Ir Pta Venous Left  12/07/2014   INDICATION: 39 year old female with end-stage renal disease has been referred for tunneled hemodialysis catheter placement for initiation of hemodialysis.  She has had a prior right-sided tunneled IJ catheter placed for medications. The left IJ approach was selected.  EXAM: TUNNELED CENTRAL VENOUS HEMODIALYSIS CATHETER PLACEMENT WITH ULTRASOUND AND FLUOROSCOPIC GUIDANCE  ULTRASOUND GUIDED ACCESS OF LEFT INTERNAL JUGULAR VEIN AND RIGHT COMMON FEMORAL VEIN  MEDICATIONS: Ancef 2 gm IV; The IV antibiotic was given in an appropriate time interval prior to skin puncture.  CONTRAST:  None  ANESTHESIA/SEDATION: Versed 4.0 mg IV; Fentanyl 200 mcg IV  Total Moderate Sedation Time  One hundred forty-one minutes.  FLUOROSCOPY TIME:  11 minutes 30 seconds  COMPLICATIONS: None  PROCEDURE: Informed written consent was obtained from the patient after a discussion of the risks, benefits, and alternatives to treatment. Questions regarding the procedure were encouraged and answered. The left neck and chest were prepped with chlorhexidine in a sterile fashion, and a sterile drape was applied covering the operative field. Maximum barrier sterile technique with sterile gowns and gloves were used for the procedure. A timeout was performed prior to the initiation of the procedure.  After creating  a small venotomy incision, a micropuncture kit was utilized to access the left internal jugular vein under direct, real-time ultrasound guidance after the overlying soft tissues were anesthetized  with 1% lidocaine with epinephrine. Ultrasound image documentation was performed.  Once the micropuncture set was introduced, the inner dilator wire removed and a wire was navigated into the right heart. Serial dilation of the soft tissue tract was performed over the wire, and a Kumpe the catheter was advanced over the wire navigating the wire into the IVC. Once the catheter was advanced the IVC the wire was exchanged for stiff wire. Before removing the first wire a length of 23 cm tip to cuff was estimated.  1% lidocaine was used to anesthetize the chest, and a small incision was made with 11 blade scalpel. The catheter was then back tunneled through the soft tissues to the IJ puncture site. Catheter was then pulled through the tract. Peel-away sheath was placed over the stiff wire, and then the catheter was passed through the peel-away sheath. Peel-away sheath was removed.  Tip the catheter was positioned at the superior cavoatrial junction. Aspiration of the ports on the Palindrome catheter proved that the catheter was nonfunctional. Positioning of the catheter at multiple sites at the right atrium and superior cavoatrial junction prove to be nonfunctional.  Catheter was removed over a stiff wire which was subsequently advanced into the IVC. Balloon dilation of the right atrium was performed with and 8 mm balloon through a 10 French sheath in the tissue tract. Once the balloon was removed a new 23 cm tip to cuff Palindrome catheter was placed with the tip positioned at several positions at the superior cavoatrial junction and the right atrium. The catheter was nonfunctional.  At this time the tunneled right IJ catheter was exchanged over a wire, for the possibility of a catheter associated thrombus as the obstruction to flow of the left-sided HD catheter. This is dictated under different heading.  The left IJ catheter site was abandoned at this point with removal of the catheter and sterile dressing placed at the  left IJ puncture site and left chest wall.  The right femoral vein was selected for a new catheter site.  The right femoral region was then prepped with chlorhexidine in a sterile fashion, and a sterile drape was applied covering the operative field. Maximum barrier sterile technique with sterile gowns and gloves were used for the procedure.  After creating a small venotomy incision, a micropuncture kit was utilized to access the right femoral vein under direct, real-time ultrasound guidance after the overlying soft tissues were anesthetized with 1% lidocaine with epinephrine. Ultrasound image documentation was performed.  Once the micropuncture set was introduced, the inner dilator wire removed and a wire was navigated into the iliac vein. The inner dilator was removed with the wire and an 035 Bentson wire was advanced into the IVC. Soft tissue tract dilation was performed. A 44 cm tip to cuff hemodialysis catheter was estimated.  1% lidocaine was used to anesthetize the skin and subcutaneous tissues lateral to the femoral puncture site, and a small incision was made with 11 blade scalpel. The catheter was then back tunneled through the soft tissues. Catheter was then pulled through the tract. Peel-away sheath was placed over the wire, and then the catheter was passed through the peel-away sheath. Peel-away sheath was removed.  The 44 cm tip to cuff catheter was positioned just below the diaphragm and the red and blue port were aspirated. The  catheter was nonfunctional.  Catheter was removed over wire with a 10 French 25 cm sheath placed over the wire. A pigtail catheter was then advanced over the wire and an IVC cavagram was performed.  A 33 cm tip to cuff catheter was selected to place in the mid IVC. This was placed over a stiff wire once the sheath was removed through a peel-away.  This was proven to be functional in the mid IVC.  Patient tolerated the procedure well and remained hemodynamically stable  throughout.  No complications were encountered and no significant blood loss was encountered.  Final catheter positioning was confirmed and documented with a spot radiographic image. The catheter aspirates and flushes normally. The catheter was flushed with appropriate volume heparin dwells.  The catheter exit site was secured with a 0-Prolene retention suture. Venotomy was closed with Derma bond. Dressings were applied. The patient tolerated the procedure well without immediate post procedural complication.  IMPRESSION: Status post placement of right femoral vein tunneled hemodialysis catheter. This catheter is a 33 cm tip to cuff position in the mid IVC and is proven to be functional. Catheter is ready for use.  Before placement of the right femoral vein catheter, a left IJ approach 23 cm tip to cuff catheter was placed which was nonfunctional, as well as a right femoral catheter measuring 44 cm tip to cuff, which was nonfunctional. Presumably this was related to patient anatomy of the right heart and hepatic IVC.  Signed,  Dulcy Fanny. Earleen Newport, DO  [facility]  [facility]  PLAN: For future catheter placement, is useful to know that the catheter placed from left IJ approach proved to be non-functional at multiple positions within the right heart, although this remains a possible option. In addition, a catheter positioned at the hepatic IVC was also proved to be nonfunctional on today's study, potentially related to the size of the IVC.   Electronically Signed   By: Corrie Mckusick D.O.   On: 12/07/2014 13:00   Ir Fluoro Guide Cv Line Left  12/07/2014   INDICATION: 39 year old female with end-stage renal disease has been referred for tunneled hemodialysis catheter placement for initiation of hemodialysis.  She has had a prior right-sided tunneled IJ catheter placed for medications. The left IJ approach was selected.  EXAM: TUNNELED CENTRAL VENOUS HEMODIALYSIS CATHETER  PLACEMENT WITH ULTRASOUND AND FLUOROSCOPIC GUIDANCE  ULTRASOUND GUIDED ACCESS OF LEFT INTERNAL JUGULAR VEIN AND RIGHT COMMON FEMORAL VEIN  MEDICATIONS: Ancef 2 gm IV; The IV antibiotic was given in an appropriate time interval prior to skin puncture.  CONTRAST:  None  ANESTHESIA/SEDATION: Versed 4.0 mg IV; Fentanyl 200 mcg IV  Total Moderate Sedation Time  One hundred forty-one minutes.  FLUOROSCOPY TIME:  11 minutes 30 seconds  COMPLICATIONS: None  PROCEDURE: Informed written consent was obtained from the patient after a discussion of the risks, benefits, and alternatives to treatment. Questions regarding the procedure were encouraged and answered. The left neck and chest were prepped with chlorhexidine in a sterile fashion, and a sterile drape was applied covering the operative field. Maximum barrier sterile technique with sterile gowns and gloves were used for the procedure. A timeout was performed prior to the initiation of the procedure.  After creating a small venotomy incision, a micropuncture kit was utilized to access the left internal jugular vein under direct, real-time ultrasound guidance after the overlying soft tissues were anesthetized with 1% lidocaine with epinephrine. Ultrasound image documentation was performed.  Once the micropuncture  set was introduced, the inner dilator wire removed and a wire was navigated into the right heart. Serial dilation of the soft tissue tract was performed over the wire, and a Kumpe the catheter was advanced over the wire navigating the wire into the IVC. Once the catheter was advanced the IVC the wire was exchanged for stiff wire. Before removing the first wire a length of 23 cm tip to cuff was estimated.  1% lidocaine was used to anesthetize the chest, and a small incision was made with 11 blade scalpel. The catheter was then back tunneled through the soft tissues to the IJ puncture site. Catheter was then pulled through the tract. Peel-away sheath was placed over  the stiff wire, and then the catheter was passed through the peel-away sheath. Peel-away sheath was removed.  Tip the catheter was positioned at the superior cavoatrial junction. Aspiration of the ports on the Palindrome catheter proved that the catheter was nonfunctional. Positioning of the catheter at multiple sites at the right atrium and superior cavoatrial junction prove to be nonfunctional.  Catheter was removed over a stiff wire which was subsequently advanced into the IVC. Balloon dilation of the right atrium was performed with and 8 mm balloon through a 10 French sheath in the tissue tract. Once the balloon was removed a new 23 cm tip to cuff Palindrome catheter was placed with the tip positioned at several positions at the superior cavoatrial junction and the right atrium. The catheter was nonfunctional.  At this time the tunneled right IJ catheter was exchanged over a wire, for the possibility of a catheter associated thrombus as the obstruction to flow of the left-sided HD catheter. This is dictated under different heading.  The left IJ catheter site was abandoned at this point with removal of the catheter and sterile dressing placed at the left IJ puncture site and left chest wall.  The right femoral vein was selected for a new catheter site.  The right femoral region was then prepped with chlorhexidine in a sterile fashion, and a sterile drape was applied covering the operative field. Maximum barrier sterile technique with sterile gowns and gloves were used for the procedure.  After creating a small venotomy incision, a micropuncture kit was utilized to access the right femoral vein under direct, real-time ultrasound guidance after the overlying soft tissues were anesthetized with 1% lidocaine with epinephrine. Ultrasound image documentation was performed.  Once the micropuncture set was introduced, the inner dilator wire removed and a wire was navigated into the iliac vein. The inner dilator was  removed with the wire and an 035 Bentson wire was advanced into the IVC. Soft tissue tract dilation was performed. A 44 cm tip to cuff hemodialysis catheter was estimated.  1% lidocaine was used to anesthetize the skin and subcutaneous tissues lateral to the femoral puncture site, and a small incision was made with 11 blade scalpel. The catheter was then back tunneled through the soft tissues. Catheter was then pulled through the tract. Peel-away sheath was placed over the wire, and then the catheter was passed through the peel-away sheath. Peel-away sheath was removed.  The 44 cm tip to cuff catheter was positioned just below the diaphragm and the red and blue port were aspirated. The catheter was nonfunctional.  Catheter was removed over wire with a 10 French 25 cm sheath placed over the wire. A pigtail catheter was then advanced over the wire and an IVC cavagram was performed.  A 33 cm tip to cuff  catheter was selected to place in the mid IVC. This was placed over a stiff wire once the sheath was removed through a peel-away.  This was proven to be functional in the mid IVC.  Patient tolerated the procedure well and remained hemodynamically stable throughout.  No complications were encountered and no significant blood loss was encountered.  Final catheter positioning was confirmed and documented with a spot radiographic image. The catheter aspirates and flushes normally. The catheter was flushed with appropriate volume heparin dwells.  The catheter exit site was secured with a 0-Prolene retention suture. Venotomy was closed with Derma bond. Dressings were applied. The patient tolerated the procedure well without immediate post procedural complication.  IMPRESSION: Status post placement of right femoral vein tunneled hemodialysis catheter. This catheter is a 33 cm tip to cuff position in the mid IVC and is proven to be functional. Catheter is ready for use.  Before placement of the right femoral vein catheter, a  left IJ approach 23 cm tip to cuff catheter was placed which was nonfunctional, as well as a right femoral catheter measuring 44 cm tip to cuff, which was nonfunctional. Presumably this was related to patient anatomy of the right heart and hepatic IVC.  Signed,  Dulcy Fanny. Earleen Newport, DO  [facility]  [facility]  PLAN: For future catheter placement, is useful to know that the catheter placed from left IJ approach proved to be non-functional at multiple positions within the right heart, although this remains a possible option. In addition, a catheter positioned at the hepatic IVC was also proved to be nonfunctional on today's study, potentially related to the size of the IVC.   Electronically Signed   By: Corrie Mckusick D.O.   On: 12/07/2014 13:00   Ir Fluoro Guide Cv Line Right  12/07/2014   INDICATION: 39 year old female with end-stage renal disease has been referred for tunneled hemodialysis catheter placement for initiation of hemodialysis.  She has had a prior right-sided tunneled IJ catheter placed for medications. The left IJ approach was selected.  EXAM: TUNNELED CENTRAL VENOUS HEMODIALYSIS CATHETER PLACEMENT WITH ULTRASOUND AND FLUOROSCOPIC GUIDANCE  ULTRASOUND GUIDED ACCESS OF LEFT INTERNAL JUGULAR VEIN AND RIGHT COMMON FEMORAL VEIN  MEDICATIONS: Ancef 2 gm IV; The IV antibiotic was given in an appropriate time interval prior to skin puncture.  CONTRAST:  None  ANESTHESIA/SEDATION: Versed 4.0 mg IV; Fentanyl 200 mcg IV  Total Moderate Sedation Time  One hundred forty-one minutes.  FLUOROSCOPY TIME:  11 minutes 30 seconds  COMPLICATIONS: None  PROCEDURE: Informed written consent was obtained from the patient after a discussion of the risks, benefits, and alternatives to treatment. Questions regarding the procedure were encouraged and answered. The left neck and chest were prepped with chlorhexidine in a sterile fashion, and a sterile drape was applied covering the  operative field. Maximum barrier sterile technique with sterile gowns and gloves were used for the procedure. A timeout was performed prior to the initiation of the procedure.  After creating a small venotomy incision, a micropuncture kit was utilized to access the left internal jugular vein under direct, real-time ultrasound guidance after the overlying soft tissues were anesthetized with 1% lidocaine with epinephrine. Ultrasound image documentation was performed.  Once the micropuncture set was introduced, the inner dilator wire removed and a wire was navigated into the right heart. Serial dilation of the soft tissue tract was performed over the wire, and a Kumpe the catheter was advanced over the wire navigating the wire  into the IVC. Once the catheter was advanced the IVC the wire was exchanged for stiff wire. Before removing the first wire a length of 23 cm tip to cuff was estimated.  1% lidocaine was used to anesthetize the chest, and a small incision was made with 11 blade scalpel. The catheter was then back tunneled through the soft tissues to the IJ puncture site. Catheter was then pulled through the tract. Peel-away sheath was placed over the stiff wire, and then the catheter was passed through the peel-away sheath. Peel-away sheath was removed.  Tip the catheter was positioned at the superior cavoatrial junction. Aspiration of the ports on the Palindrome catheter proved that the catheter was nonfunctional. Positioning of the catheter at multiple sites at the right atrium and superior cavoatrial junction prove to be nonfunctional.  Catheter was removed over a stiff wire which was subsequently advanced into the IVC. Balloon dilation of the right atrium was performed with and 8 mm balloon through a 10 French sheath in the tissue tract. Once the balloon was removed a new 23 cm tip to cuff Palindrome catheter was placed with the tip positioned at several positions at the superior cavoatrial junction and the  right atrium. The catheter was nonfunctional.  At this time the tunneled right IJ catheter was exchanged over a wire, for the possibility of a catheter associated thrombus as the obstruction to flow of the left-sided HD catheter. This is dictated under different heading.  The left IJ catheter site was abandoned at this point with removal of the catheter and sterile dressing placed at the left IJ puncture site and left chest wall.  The right femoral vein was selected for a new catheter site.  The right femoral region was then prepped with chlorhexidine in a sterile fashion, and a sterile drape was applied covering the operative field. Maximum barrier sterile technique with sterile gowns and gloves were used for the procedure.  After creating a small venotomy incision, a micropuncture kit was utilized to access the right femoral vein under direct, real-time ultrasound guidance after the overlying soft tissues were anesthetized with 1% lidocaine with epinephrine. Ultrasound image documentation was performed.  Once the micropuncture set was introduced, the inner dilator wire removed and a wire was navigated into the iliac vein. The inner dilator was removed with the wire and an 035 Bentson wire was advanced into the IVC. Soft tissue tract dilation was performed. A 44 cm tip to cuff hemodialysis catheter was estimated.  1% lidocaine was used to anesthetize the skin and subcutaneous tissues lateral to the femoral puncture site, and a small incision was made with 11 blade scalpel. The catheter was then back tunneled through the soft tissues. Catheter was then pulled through the tract. Peel-away sheath was placed over the wire, and then the catheter was passed through the peel-away sheath. Peel-away sheath was removed.  The 44 cm tip to cuff catheter was positioned just below the diaphragm and the red and blue port were aspirated. The catheter was nonfunctional.  Catheter was removed over wire with a 10 French 25 cm sheath  placed over the wire. A pigtail catheter was then advanced over the wire and an IVC cavagram was performed.  A 33 cm tip to cuff catheter was selected to place in the mid IVC. This was placed over a stiff wire once the sheath was removed through a peel-away.  This was proven to be functional in the mid IVC.  Patient tolerated the procedure well and  remained hemodynamically stable throughout.  No complications were encountered and no significant blood loss was encountered.  Final catheter positioning was confirmed and documented with a spot radiographic image. The catheter aspirates and flushes normally. The catheter was flushed with appropriate volume heparin dwells.  The catheter exit site was secured with a 0-Prolene retention suture. Venotomy was closed with Derma bond. Dressings were applied. The patient tolerated the procedure well without immediate post procedural complication.  IMPRESSION: Status post placement of right femoral vein tunneled hemodialysis catheter. This catheter is a 33 cm tip to cuff position in the mid IVC and is proven to be functional. Catheter is ready for use.  Before placement of the right femoral vein catheter, a left IJ approach 23 cm tip to cuff catheter was placed which was nonfunctional, as well as a right femoral catheter measuring 44 cm tip to cuff, which was nonfunctional. Presumably this was related to patient anatomy of the right heart and hepatic IVC.  Signed,  Dulcy Fanny. Earleen Newport, DO  [facility]  [facility]  PLAN: For future catheter placement, is useful to know that the catheter placed from left IJ approach proved to be non-functional at multiple positions within the right heart, although this remains a possible option. In addition, a catheter positioned at the hepatic IVC was also proved to be nonfunctional on today's study, potentially related to the size of the IVC.   Electronically Signed   By: Corrie Mckusick D.O.   On:  12/07/2014 13:00   Ir Fluoro Guide Cv Line Right  12/07/2014   CLINICAL DATA:  39 year old female presents for initiation of hemodialysis, and placement of tunneled hemodialysis catheter.  A previous tunneled right central catheter is in place from right IJ approach and [place] left IJ was initially selected for hemodialysis catheter placement.  Nonfunctioning hemodialysis catheter from left IJ approach necessitated exchange of the right-sided IJ central catheter for trouble shooting purposes.  EXAM: ULTRASOUND AND FLUOROSCOPIC GUIDED RIGHT TUNNELED CATHETER EXCHANGE.  FLUOROSCOPY TIME:  11 MINUTES 30 SECONDS  TECHNIQUE: The procedure, risks, benefits, and alternatives were explained to the patient. Questions regarding the procedure were encouraged and answered. The patient understands and consents to the procedure.  The patient is position in the supine position on the fluoroscopy table. Right neck and right upper chest were prepped and draped in the usual sterile fashion with sterile gloves counts and usual sterile technique. 1% lidocaine was used for local anesthesia.  For trouble shooting purposes the right tunneled IJ central catheter was exchanged over a wire. The new catheter was trimmed at 21 cm length and confirmed in position within the right atrium after replacement.  Catheter was sutured in position with retention sutures.  After exchange of this catheter the case was continued with removal of left IJ hemodialysis catheter and placement of a right femoral line for hemodialysis initiation.  Patient tolerated the procedure well and remained hemodynamically stable throughout.  No complications were encountered and no significant blood loss was encountered.  COMPLICATIONS: None  IMPRESSION: Exchange of right IJ central catheter was performed during placement of a hemodialysis catheter for trouble shooting purposes when the initial left IJ approach HD catheter would not function.  Upon completion of exchange of  the tunneled central catheter from right IJ approach, the catheter is functioning.  Signed,  Dulcy Fanny. Earleen Newport, DO  [facility]  [facility]iology   Electronically Signed   By: Corrie Mckusick D.O.  On: 12/07/2014 12:30   Ir Removal Tun Cv Cath W/o Fl  12/08/2014   INDICATION: 39 year old female with end-stage renal disease has been referred for tunneled hemodialysis catheter placement for initiation of hemodialysis.  She has had a prior right-sided tunneled IJ catheter placed for medications. The left IJ approach was selected.  EXAM: TUNNELED CENTRAL VENOUS HEMODIALYSIS CATHETER PLACEMENT WITH ULTRASOUND AND FLUOROSCOPIC GUIDANCE  ULTRASOUND GUIDED ACCESS OF LEFT INTERNAL JUGULAR VEIN AND RIGHT COMMON FEMORAL VEIN  MEDICATIONS: Ancef 2 gm IV; The IV antibiotic was given in an appropriate time interval prior to skin puncture.  CONTRAST:  None  ANESTHESIA/SEDATION: Versed 4.0 mg IV; Fentanyl 200 mcg IV  Total Moderate Sedation Time  One hundred forty-one minutes.  FLUOROSCOPY TIME:  11 minutes 30 seconds  COMPLICATIONS: None  PROCEDURE: Informed written consent was obtained from the patient after a discussion of the risks, benefits, and alternatives to treatment. Questions regarding the procedure were encouraged and answered. The left neck and chest were prepped with chlorhexidine in a sterile fashion, and a sterile drape was applied covering the operative field. Maximum barrier sterile technique with sterile gowns and gloves were used for the procedure. A timeout was performed prior to the initiation of the procedure.  After creating a small venotomy incision, a micropuncture kit was utilized to access the left internal jugular vein under direct, real-time ultrasound guidance after the overlying soft tissues were anesthetized with 1% lidocaine with epinephrine. Ultrasound image documentation was performed.  Once the micropuncture set was introduced, the inner dilator wire  removed and a wire was navigated into the right heart. Serial dilation of the soft tissue tract was performed over the wire, and a Kumpe the catheter was advanced over the wire navigating the wire into the IVC. Once the catheter was advanced the IVC the wire was exchanged for stiff wire. Before removing the first wire a length of 23 cm tip to cuff was estimated.  1% lidocaine was used to anesthetize the chest, and a small incision was made with 11 blade scalpel. The catheter was then back tunneled through the soft tissues to the IJ puncture site. Catheter was then pulled through the tract. Peel-away sheath was placed over the stiff wire, and then the catheter was passed through the peel-away sheath. Peel-away sheath was removed.  Tip the catheter was positioned at the superior cavoatrial junction. Aspiration of the ports on the Palindrome catheter proved that the catheter was nonfunctional. Positioning of the catheter at multiple sites at the right atrium and superior cavoatrial junction prove to be nonfunctional.  Catheter was removed over a stiff wire which was subsequently advanced into the IVC. Balloon dilation of the right atrium was performed with and 8 mm balloon through a 10 French sheath in the tissue tract. Once the balloon was removed a new 23 cm tip to cuff Palindrome catheter was placed with the tip positioned at several positions at the superior cavoatrial junction and the right atrium. The catheter was nonfunctional.  At this time the tunneled right IJ catheter was exchanged over a wire, for the possibility of a catheter associated thrombus as the obstruction to flow of the left-sided HD catheter. This is dictated under different heading.  The left IJ catheter site was abandoned at this point with removal of the catheter and sterile dressing placed at the left IJ puncture site and left chest wall.  The right femoral vein was selected for a new catheter site.  The right femoral region was then  prepped  with chlorhexidine in a sterile fashion, and a sterile drape was applied covering the operative field. Maximum barrier sterile technique with sterile gowns and gloves were used for the procedure.  After creating a small venotomy incision, a micropuncture kit was utilized to access the right femoral vein under direct, real-time ultrasound guidance after the overlying soft tissues were anesthetized with 1% lidocaine with epinephrine. Ultrasound image documentation was performed.  Once the micropuncture set was introduced, the inner dilator wire removed and a wire was navigated into the iliac vein. The inner dilator was removed with the wire and an 035 Bentson wire was advanced into the IVC. Soft tissue tract dilation was performed. A 44 cm tip to cuff hemodialysis catheter was estimated.  1% lidocaine was used to anesthetize the skin and subcutaneous tissues lateral to the femoral puncture site, and a small incision was made with 11 blade scalpel. The catheter was then back tunneled through the soft tissues. Catheter was then pulled through the tract. Peel-away sheath was placed over the wire, and then the catheter was passed through the peel-away sheath. Peel-away sheath was removed.  The 44 cm tip to cuff catheter was positioned just below the diaphragm and the red and blue port were aspirated. The catheter was nonfunctional.  Catheter was removed over wire with a 10 French 25 cm sheath placed over the wire. A pigtail catheter was then advanced over the wire and an IVC cavagram was performed.  A 33 cm tip to cuff catheter was selected to place in the mid IVC. This was placed over a stiff wire once the sheath was removed through a peel-away.  This was proven to be functional in the mid IVC.  Patient tolerated the procedure well and remained hemodynamically stable throughout.  No complications were encountered and no significant blood loss was encountered.  Final catheter positioning was confirmed and documented with  a spot radiographic image. The catheter aspirates and flushes normally. The catheter was flushed with appropriate volume heparin dwells.  The catheter exit site was secured with a 0-Prolene retention suture. Venotomy was closed with Derma bond. Dressings were applied. The patient tolerated the procedure well without immediate post procedural complication.  IMPRESSION: Status post placement of right femoral vein tunneled hemodialysis catheter. This catheter is a 33 cm tip to cuff position in the mid IVC and is proven to be functional. Catheter is ready for use.  Before placement of the right femoral vein catheter, a left IJ approach 23 cm tip to cuff catheter was placed which was nonfunctional, as well as a right femoral catheter measuring 44 cm tip to cuff, which was nonfunctional. Presumably this was related to patient anatomy of the right heart and hepatic IVC.  Signed,  Dulcy Fanny. Earleen Newport, DO  [facility]  [facility]iology  PLAN: For future catheter placement, is useful to know that the catheter placed from left IJ approach proved to be non-functional at multiple positions within the right heart, although this remains a possible option. In addition, a catheter positioned at the hepatic IVC was also proved to be nonfunctional on today's study, potentially related to the size of the IVC.   Electronically Signed   By: Corrie Mckusick D.O.   On: 12/07/2014 13:00   Ir US Guide Vasc Access Left  12/07/2014   INDICATION: 39 year old female with end-stage renal disease has been referred for tunneled hemodialysis catheter placement for initiation of hemodialysis.  She has had a prior right-sided tunneled  IJ catheter placed for medications. The left IJ approach was selected.  EXAM: TUNNELED CENTRAL VENOUS HEMODIALYSIS CATHETER PLACEMENT WITH ULTRASOUND AND FLUOROSCOPIC GUIDANCE  ULTRASOUND GUIDED ACCESS OF LEFT INTERNAL JUGULAR VEIN AND RIGHT COMMON FEMORAL VEIN  MEDICATIONS: Ancef 2  gm IV; The IV antibiotic was given in an appropriate time interval prior to skin puncture.  CONTRAST:  None  ANESTHESIA/SEDATION: Versed 4.0 mg IV; Fentanyl 200 mcg IV  Total Moderate Sedation Time  One hundred forty-one minutes.  FLUOROSCOPY TIME:  11 minutes 30 seconds  COMPLICATIONS: None  PROCEDURE: Informed written consent was obtained from the patient after a discussion of the risks, benefits, and alternatives to treatment. Questions regarding the procedure were encouraged and answered. The left neck and chest were prepped with chlorhexidine in a sterile fashion, and a sterile drape was applied covering the operative field. Maximum barrier sterile technique with sterile gowns and gloves were used for the procedure. A timeout was performed prior to the initiation of the procedure.  After creating a small venotomy incision, a micropuncture kit was utilized to access the left internal jugular vein under direct, real-time ultrasound guidance after the overlying soft tissues were anesthetized with 1% lidocaine with epinephrine. Ultrasound image documentation was performed.  Once the micropuncture set was introduced, the inner dilator wire removed and a wire was navigated into the right heart. Serial dilation of the soft tissue tract was performed over the wire, and a Kumpe the catheter was advanced over the wire navigating the wire into the IVC. Once the catheter was advanced the IVC the wire was exchanged for stiff wire. Before removing the first wire a length of 23 cm tip to cuff was estimated.  1% lidocaine was used to anesthetize the chest, and a small incision was made with 11 blade scalpel. The catheter was then back tunneled through the soft tissues to the IJ puncture site. Catheter was then pulled through the tract. Peel-away sheath was placed over the stiff wire, and then the catheter was passed through the peel-away sheath. Peel-away sheath was removed.  Tip the catheter was positioned at the superior  cavoatrial junction. Aspiration of the ports on the Palindrome catheter proved that the catheter was nonfunctional. Positioning of the catheter at multiple sites at the right atrium and superior cavoatrial junction prove to be nonfunctional.  Catheter was removed over a stiff wire which was subsequently advanced into the IVC. Balloon dilation of the right atrium was performed with and 8 mm balloon through a 10 French sheath in the tissue tract. Once the balloon was removed a new 23 cm tip to cuff Palindrome catheter was placed with the tip positioned at several positions at the superior cavoatrial junction and the right atrium. The catheter was nonfunctional.  At this time the tunneled right IJ catheter was exchanged over a wire, for the possibility of a catheter associated thrombus as the obstruction to flow of the left-sided HD catheter. This is dictated under different heading.  The left IJ catheter site was abandoned at this point with removal of the catheter and sterile dressing placed at the left IJ puncture site and left chest wall.  The right femoral vein was selected for a new catheter site.  The right femoral region was then prepped with chlorhexidine in a sterile fashion, and a sterile drape was applied covering the operative field. Maximum barrier sterile technique with sterile gowns and gloves were used for the procedure.  After creating a small venotomy incision, a micropuncture kit was  utilized to access the right femoral vein under direct, real-time ultrasound guidance after the overlying soft tissues were anesthetized with 1% lidocaine with epinephrine. Ultrasound image documentation was performed.  Once the micropuncture set was introduced, the inner dilator wire removed and a wire was navigated into the iliac vein. The inner dilator was removed with the wire and an 035 Bentson wire was advanced into the IVC. Soft tissue tract dilation was performed. A 44 cm tip to cuff hemodialysis catheter was  estimated.  1% lidocaine was used to anesthetize the skin and subcutaneous tissues lateral to the femoral puncture site, and a small incision was made with 11 blade scalpel. The catheter was then back tunneled through the soft tissues. Catheter was then pulled through the tract. Peel-away sheath was placed over the wire, and then the catheter was passed through the peel-away sheath. Peel-away sheath was removed.  The 44 cm tip to cuff catheter was positioned just below the diaphragm and the red and blue port were aspirated. The catheter was nonfunctional.  Catheter was removed over wire with a 10 French 25 cm sheath placed over the wire. A pigtail catheter was then advanced over the wire and an IVC cavagram was performed.  A 33 cm tip to cuff catheter was selected to place in the mid IVC. This was placed over a stiff wire once the sheath was removed through a peel-away.  This was proven to be functional in the mid IVC.  Patient tolerated the procedure well and remained hemodynamically stable throughout.  No complications were encountered and no significant blood loss was encountered.  Final catheter positioning was confirmed and documented with a spot radiographic image. The catheter aspirates and flushes normally. The catheter was flushed with appropriate volume heparin dwells.  The catheter exit site was secured with a 0-Prolene retention suture. Venotomy was closed with Derma bond. Dressings were applied. The patient tolerated the procedure well without immediate post procedural complication.  IMPRESSION: Status post placement of right femoral vein tunneled hemodialysis catheter. This catheter is a 33 cm tip to cuff position in the mid IVC and is proven to be functional. Catheter is ready for use.  Before placement of the right femoral vein catheter, a left IJ approach 23 cm tip to cuff catheter was placed which was nonfunctional, as well as a right femoral catheter measuring 44 cm tip to cuff, which was  nonfunctional. Presumably this was related to patient anatomy of the right heart and hepatic IVC.  Signed,  Dulcy Fanny. Earleen Newport, DO  [facility]  [facility]  PLAN: For future catheter placement, is useful to know that the catheter placed from left IJ approach proved to be non-functional at multiple positions within the right heart, although this remains a possible option. In addition, a catheter positioned at the hepatic IVC was also proved to be nonfunctional on today's study, potentially related to the size of the IVC.   Electronically Signed   By: Corrie Mckusick D.O.   On: 12/07/2014 13:00   Ir US Guide Vasc Access Right  12/07/2014   CLINICAL DATA:  39 year old female presents for initiation of hemodialysis, and placement of tunneled hemodialysis catheter.  A previous tunneled right central catheter is in place from right IJ approach and [place] left IJ was initially selected for hemodialysis catheter placement.  Nonfunctioning hemodialysis catheter from left IJ approach necessitated exchange of the right-sided IJ central catheter for trouble shooting purposes.  EXAM: ULTRASOUND AND FLUOROSCOPIC GUIDED RIGHT TUNNELED  CATHETER EXCHANGE.  FLUOROSCOPY TIME:  11 MINUTES 30 SECONDS  TECHNIQUE: The procedure, risks, benefits, and alternatives were explained to the patient. Questions regarding the procedure were encouraged and answered. The patient understands and consents to the procedure.  The patient is position in the supine position on the fluoroscopy table. Right neck and right upper chest were prepped and draped in the usual sterile fashion with sterile gloves counts and usual sterile technique. 1% lidocaine was used for local anesthesia.  For trouble shooting purposes the right tunneled IJ central catheter was exchanged over a wire. The new catheter was trimmed at 21 cm length and confirmed in position within the right atrium after replacement.  Catheter was sutured in  position with retention sutures.  After exchange of this catheter the case was continued with removal of left IJ hemodialysis catheter and placement of a right femoral line for hemodialysis initiation.  Patient tolerated the procedure well and remained hemodynamically stable throughout.  No complications were encountered and no significant blood loss was encountered.  COMPLICATIONS: None  IMPRESSION: Exchange of right IJ central catheter was performed during placement of a hemodialysis catheter for trouble shooting purposes when the initial left IJ approach HD catheter would not function.  Upon completion of exchange of the tunneled central catheter from right IJ approach, the catheter is functioning.  Signed,  Dulcy Fanny. Earleen Newport, DO  [facility]  [facility]   Electronically Signed   By: Corrie Mckusick D.O.   On: 12/07/2014 12:30   Dg Ang/ext/uni/or Left  12/19/2014   CLINICAL DATA:  Insertion of left upper extremity AV graft.  EXAM: LEFT ANG/EXT/UNI/ OR  FLUOROSCOPY TIME:  1 minutes, 59 seconds  COMPARISON:  None  FINDINGS: Central left upper extremity venogram demonstrates contrast injection from the level of the left axillary vein. There is wide patency of the left axillary, subclavian and imaged portions of the left innominate vein.  The SVC is not imaged though there is apparent contrast opacification of the main pulmonary artery.  Subsequent images demonstrate placement of a vascular stent overlying the left axilla.  IMPRESSION: Intraoperative images during left upper extremity central venogram and placement of a left axillary vascular stent as above.   Electronically Signed   By: Sandi Mariscal M.D.   On: 12/19/2014 14:20    2D Echo: 12/22/2014 - Left ventricle: The cavity size was normal. Wall thickness wasincreased in a pattern of severe LVH. Systolic function wasvigorous. The estimated ejection fraction was in the range of 65%to 70%. Doppler parameters are  consistent with abnormal leftventricular relaxation (grade 1 diastolic dysfunction). The E/e&'ratio is between 8-15, suggesting indeterminate LV fillingpressure. - Mitral valve: Calcified annulus. - Left atrium: The atrium was mildly dilated. - Right atrium: The atrium was mildly dilated. - Inferior vena cava: The vessel was dilated. The respirophasicdiameter changes were blunted (<50%), consistent with elevatedcentral venous pressure. - Pericardium, extracardiac: A trivial pericardial effusion wasidentified. Features were not consistent with tamponadephysiology.  Impressions:  - Compared to the prior echo in 2014, there is now severe LV wallthickening. The EF is higher at 65-70%. A trivial pericardialeffusion persists.  Cardiac Cath: n/a  Admission HPI: 39 y/o woman with PMHx of SVT s/p ablation 2014, anemia of chronic disease, hypertension, and SLE presents to hospital after 2 days of worsening dysuria 1 day of gross hematuria. 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 Consultation of postprocedural blood loss  with hemoglobin decreased from 8.9 to 6.8 and patient was seen in ED over the interval for total 2 units packed red blood cells transfusion and 3 doses IV Solu-Medrol. Also on by mouth prednisone 60 mg daily total during interval. She expresses urinary urgency however only voids approximately once per 5-6 hours. She does endorse some subjective fever and nausea worse at night. She does report decreased appetite and disordered day night sleep cycle. She has not expressed any shortness of breath, confusion, diarrhea, constipation, or vomiting. She does have worsening leg swelling which is painful on prolonged ambulation or standing. Patient had a creatinine 1.6 baseline until June of this year however has been worsening from 4.54 on 7/24 to 6.23 on 8/29, now 8.9 at admission.  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 Presented with BUN 131, Cr 8.9, CO2 12, and K 5.2. Patient was continued on high dose prednisone and Cellcept and started cefetriaxone for UTI. Urine Cx returned positive for kelbsiella pneumoniae in urine, blood cultures negative.   Blood pressure was resistant to therapy remaining SBP >160, and required titration of metoprolol, amlodipine, hydralazine. Treatment course continued with worsening nausea, vomiting, BUN increased to 170 and recurrent hyperkalemia and patient had temporary HD catheter placed 9/9, after failure due to poor vasculature at [facility] site a R femoral catheter was placed.   Started on HD without complications. However patient developed slow oozing bleed at her multiple surgical sites. These were managed with pressure dressing and patient given 2 doses of DDAVP for suspected uremic platelet dysfunction. She required blood transfusion for Hgb<7 on 9/10 and 9/15. This was also accompanied with new thrombocytopenia with a drop from 373 Plts on 9/9 to 79 by 9/11.   HIT panel and DIC panels were obtained that were both negative. Intermittent slow bleeding from the LIJ site and R femoral continued for one week to 9/16. During that interval she developed a left lower abdominal candida rash that was treated to resolution with nystatin.   Vein mapping demonstrated poor vasculature bilaterally, and a hybrid graft was placed at left upper arm 9/20. She developed  arterial steal syndrome with hypoperfusion of the left hand and was emergently taken back to OR for ligation of the graft the same evening.   On examination the following day she had surrounding ecchymoses but no obvious deficits in the extremity. Decision was made to wait several days before reattempting access placement, most likely to thigh graft.   On 9/23 the patient became highly agitated then developed new left sided weakness and a code stroke was called. Patient was not a candidate for thrombolysis due to her very recent surgery. MRI revealed a stroke in the M2 branch of R MCA. Stroke workup was undertaken showing no ICA stenosis, no antiphospholipid syndrome, TTE was negative but TEE on 9/27 demonstrated 0.5x0.36m vegetation on the L mitral valve.   Repeat blood cultures, including sampling for fastidious organisms were drawn, resulting negative.  Anticoagulation was initially limited to 42m ASA. Vascular access placement to the lower extremity was delayed due to new ischemic stroke. Hematology was consulted and on review significant concern existed for bone marrow suppression and Cellcept was discontinued. Prednisone taper started and plaquenil started at 2033m Due to risk of recurrent embolism warfarin anticoagulation was started 9/28.  As infectious endocarditis could not be ruled out due to patient's immunosuppressed state and leukopenia, decision was made to treat with IV Vancomycin for 3 weeks total course. Patient was also found to have Pseudomonas UTI at this time with several days of dysuria and treated with Ceftazidime for 5 days with resolution of symptoms.  Off of Cellcept, patient's thrombocytopenia eventually corrected to WNL and WBC began to steadily increase. Plaquenil was increased to 400 mg, per phone consult with patient's rheumatologist. HD continued through temporary right thigh catheter with plan for permanent access to be placed at the left thigh. Coumadin was held and  heparin bridge started several days prior to graft placement.  Patient began to work with physical therapy after bed bound for nearly a month, and slowly made progress towards ambulating with assistance. Permanent access graft was placed at the left thigh on 01/04/15 without issue and Coumadin was restarted that night for indefinite duration. Patient was recommended for inpatient rehab and agreed with transition on 01/05/15.  Discharge Vitals:   BP 126/76 mmHg  Pulse 90  Temp(Src) 97.5 F (36.4 C) (Oral)  Resp 18  Ht _0  (1.6 m)  Wt 253 lb (114.76 kg)  BMI 44.83 kg/m2  SpO2 100%  LMP 11/17/2014 (Approximate)  Discharge Labs:  Results for orders placed or performed during the hospital encounter of 12/03/14 (from the past 24 hour(s))  Protime-INR     Status: Abnormal   Collection Time: 01/05/15  4:05 AM  Result Value Ref Range   Prothrombin Time 19.2 (H) 11.6 - 15.2 seconds   INR 1.62 (H) 0.00 - 1.49  CBC     Status: Abnormal   Collection Time: 01/05/15  4:05 AM  Result Value Ref Range   WBC 6.0 4.0 - 10.5 K/uL   RBC 2.58 (L) 3.87 - 5.11 MIL/uL   Hemoglobin 7.6 (L) 12.0 - 15.0 g/dL   HCT 24.4 (L) 36.0 - 46.0 %   MCV 94.6 78.0 - 100.0 fL   MCH 29.5 26.0 - 34.0 pg   MCHC 31.1 30.0 - 36.0 g/dL   RDW 16.0 (H) 11.5 - 15.5 %   Platelets 236 150 - 400 K/uL  Renal function panel     Status: Abnormal   Collection Time: 01/05/15  4:05 AM  Result Value Ref Range   Sodium 137 135 - 145 mmol/L   Potassium 4.8 3.5 - 5.1 mmol/L   Chloride 101 101 - 111 mmol/L   CO2 26 22 - 32 mmol/L   Glucose, Bld 105 (H) 65 - 99 mg/dL   BUN 38 (H) 6 - 20 mg/dL   Creatinine, Ser 4.69 (H) 0.44 - 1.00 mg/dL   Calcium 8.6 (L) 8.9 - 10.3 mg/dL   Phosphorus 4.8 (H) 2.5 - 4.6 mg/dL   Albumin 2.1 (L) 3.5 - 5.0 g/dL   GFR calc non Af Amer 11 (L) >60 mL/min   GFR calc Af Amer 13 (L) >60 mL/min   Anion gap 10 5 - 15    Signed: ViZada FindersMD 01/05/2015, 2:26 PM    Services Ordered on Discharge:  CIR Equipment Ordered on Discharge: none

## 2015-01-04 NOTE — Progress Notes (Signed)
Antibiotic Time-Out Note  [name] is a 39 y.o. year-old female admitted on 12/03/2014.  The patient is currently on vancomycin for endocarditis.   Recent Labs Lab 12/31/14 0525 01/01/15 0450 01/02/15 0605 01/03/15 0500 01/04/15 0104  WBC 1.7* 1.9* 2.0* 3.4* 4.9     Recent Labs Lab 12/31/14 0525 01/01/15 0450 01/02/15 0605 01/03/15 0500 01/04/15 0104  CREATININE 4.49* 4.50* 3.43* 3.84* 3.20*   Estimated Creatinine Clearance: 26.6 mL/min (by C-G formula based on Cr of 3.2). 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  Plan: 1. Continue vancomycin 1g with HD(TTHSAT) 2. Vancomycin random pre-HD level on 10/08 am to evaluate current dosing regimen of vancomycin (goal pre HD level 15 - 25)  Vincenza Hews, PharmD, BCPS 01/04/2015, 1:54 PM Pager: [phone]

## 2015-01-04 NOTE — Progress Notes (Signed)
Subjective: Interval History: has no complaint, walking.  Ok with new AVG.  Objective: Vital signs in last 24 hours: Temp:  [97.5 F (36.4 C)-99.8 F (37.7 C)] 98.5 F (36.9 C) (10/07 1406) Pulse Rate:  [101-109] 102 (10/07 1230) Resp:  [13-18] 18 (10/07 1406) BP: (110-136)/(65-87) 136/75 mmHg (10/07 1406) SpO2:  [93 %-100 %] 100 % (10/07 1300) FiO2 (%):  [32 %] 32 % (10/07 1225) Weight:  [98 kg (216 lb 0.8 oz)] 98 kg (216 lb 0.8 oz) (10/07 0630) Weight change: 1.2 kg (2 lb 10.3 oz)  Intake/Output from previous day: 10/06 0701 - 10/07 0700 In: 250 [P.O.:240; I.V.:10] Out: 4004 [Urine:500] Intake/Output this shift: Total I/O In: 500 [I.V.:500] Out: -   General appearance: alert, cooperative and moderately obese Resp: diminished breath sounds bilaterally Cardio: S1, S2 normal, S4 present and systolic murmur: holosystolic 2/6, blowing at apex GI: obese, soft, pos bs Extremities: AVG L groin,B&T, cath R groin  Lab Results:  Recent Labs  01/03/15 0500 01/04/15 0104  WBC 3.4* 4.9  HGB 9.0* 8.7*  HCT 27.8* 27.7*  PLT 204 204   BMET:  Recent Labs  01/03/15 0500 01/04/15 0104  NA 135 137  K 3.7 4.1  CL 99* 101  CO2 25 26  GLUCOSE 75 78  BUN 44* 25*  CREATININE 3.84* 3.20*  CALCIUM 8.7* 8.7*   No results for input(s): PTH in the last 72 hours. Iron Studies: No results for input(s): IRON, TIBC, TRANSFERRIN, FERRITIN in the last 72 hours.  Studies/Results: No results found.  I have reviewed the patient's current medications.  Assessment/Plan: 1 ESRD  Hd in am.  Does have some function, follow pre HD labs 2 HTN lower vol has helped bp, lower meds 3 Anemia esa/Fe 4 HPTH vit D 5 SLE  Will reassess soon 6 CVA 7 Endocarditis ? Duration of AB 8 Obesity P HD, esa. Fe, PT, Rehab.     LOS: 32 days   Clark Clowdus L 01/04/2015,3:25 PM

## 2015-01-04 NOTE — Interval H&P Note (Signed)
History and Physical Interval Note:  01/04/2015 9:32 AM  [name]  has presented today for surgery, with the diagnosis of End Stage Renal Disease N18.6  The various methods of treatment have been discussed with the patient and family. After consideration of risks, benefits and other options for treatment, the patient has consented to  Procedure(s): INSERTION OF ARTERIOVENOUS (AV) GORE-TEX GRAFT THIGH (Left) as a surgical intervention .  The patient's history has been reviewed, patient examined, no change in status, stable for surgery.  I have reviewed the patient's chart and labs.  Questions were answered to the patient's satisfaction.     Curt Jews

## 2015-01-04 NOTE — Anesthesia Preprocedure Evaluation (Addendum)
Anesthesia Evaluation  Patient identified by MRN, date of birth, ID band Patient awake    Reviewed: Allergy & Precautions, NPO status , Patient's Chart, lab work & pertinent test results  History of Anesthesia Complications Negative for: history of anesthetic complications  Airway Mallampati: III  TM Distance: >3 FB Neck ROM: Full    Dental  (+) Teeth Intact, Dental Advisory Given   Pulmonary sleep apnea ,    Pulmonary exam normal breath sounds clear to auscultation       Cardiovascular hypertension, Pt. on medications Normal cardiovascular exam Rhythm:Regular Rate:Tachycardia     Neuro/Psych Anxiety Right-sided stroke 2 weeks ago Left leg weakness CVA, Residual Symptoms    GI/Hepatic   Endo/Other    Renal/GU ESRF and DialysisRenal diseaseMWS     Musculoskeletal  (+) Arthritis , SLE   Abdominal   Peds  Hematology   Anesthesia Other Findings   Reproductive/Obstetrics negative OB ROS                            Anesthesia Physical Anesthesia Plan  ASA: III  Anesthesia Plan: MAC and General   Post-op Pain Management:    Induction: Intravenous  Airway Management Planned: LMA  Additional Equipment:   Intra-op Plan:   Post-operative Plan: Extubation in OR  Informed Consent: I have reviewed the patients History and Physical, chart, labs and discussed the procedure including the risks, benefits and alternatives for the proposed anesthesia with the patient or authorized representative who has indicated his/her understanding and acceptance.     Plan Discussed with: CRNA and Surgeon  Anesthesia Plan Comments:         Anesthesia Quick Evaluation

## 2015-01-04 NOTE — Transfer of Care (Signed)
Immediate Anesthesia Transfer of Care Note  Patient: [name]  Procedure(s) Performed: Procedure(s): INSERTION OF ARTERIOVENOUS (AV) GORE-TEX GRAFT THIGH (Left)  Patient Location: PACU  Anesthesia Type:General  Level of Consciousness: awake, oriented and patient cooperative  Airway & Oxygen Therapy: Patient Spontanous Breathing and Patient connected to nasal cannula oxygen  Post-op Assessment: Report given to RN and Post -op Vital signs reviewed and stable  Post vital signs: Reviewed  Last Vitals:  Filed Vitals:   01/04/15 1213  BP:   Pulse:   Temp: 37.7 C  Resp:     Complications: No apparent anesthesia complications

## 2015-01-04 NOTE — Progress Notes (Signed)
Rehab admissions - Will plan to admit to acute inpatient rehab tomorrow, Saturday, if patient is medically stable.  Call me for questions.  #[phone]

## 2015-01-04 NOTE — Anesthesia Procedure Notes (Signed)
Procedure Name: LMA Insertion Date/Time: 01/04/2015 10:35 AM Performed by: Luciana Axe K Pre-anesthesia Checklist: Patient identified, Emergency Drugs available, Suction available, Patient being monitored and Timeout performed Patient Re-evaluated:Patient Re-evaluated prior to inductionOxygen Delivery Method: Circle system utilized Preoxygenation: Pre-oxygenation with 100% oxygen Intubation Type: IV induction LMA: LMA inserted LMA Size: 4.0 Number of attempts: 1 Placement Confirmation: positive ETCO2,  CO2 detector and breath sounds checked- equal and bilateral Tube secured with: Tape Dental Injury: Teeth and Oropharynx as per pre-operative assessment

## 2015-01-04 NOTE — Progress Notes (Addendum)
   Subjective: Patient says she is somewhat anxious about her graft placement today. Otherwise she is feeling okay, without any changes overnight.  Objective: Vital signs in last 24 hours: Filed Vitals:   01/03/15 1400 01/03/15 2234 01/04/15 0630 01/04/15 0633  BP: 131/76 110/65  124/87  Pulse: 118 109  101  Temp: 99.7 F (37.6 C) 99.6 F (37.6 C)  99.5 F (37.5 C)  TempSrc: Oral Oral  Oral  Resp: 20 18  17   Height:      Weight:   216 lb 0.8 oz (98 kg)   SpO2: 94% 98%  96%   Weight change: 2 lb 10.3 oz (1.2 kg)  Intake/Output Summary (Last 24 hours) at 01/04/15 0751 Last data filed at 01/04/15 0553  Gross per 24 hour  Intake    250 ml  Output   3504 ml  Net  -3254 ml   General: resting in bed Cardiac: RRR Lungs: clear to auscultation anteriorly Abd: soft, nontender Neuro: alert and oriented X3   Assessment/Plan: Principal Problem:   ESRD (end stage renal disease) on dialysis (HCC) Active Problems:   Hypertension   Lupus nephritis (HCC)   UTI (lower urinary tract infection)   Retroperitoneal hematoma   Steal syndrome as complication of dialysis access (HCC)   Pressure ulcer   Other pancytopenia (HCC)   Endocarditis of mitral valve   Anemia due to bone marrow failure (HCC)   Right middle cerebral artery stroke (HCC)  Lupus nephritis with ESRD: Plan to place permanent access graft today (10/7). -bridging with heparin per pharm dosing, may restart coumadin after procedure -Continue current management and TTS HD per nephrology  Endocarditis of mitral valve: Possibly Rudene Christians, but unable to rule out infectious endocarditis as patient is immunosuppressed and leukopenic. Blood cultures were negative, however may be false as they were drawn after patient received antibiotics.  -Continue Vancomycin per pharm dosing for total 3 weeks (end 10/25)  UTI: Patient treated with Ceftazadime, last dose yesterday  Pancytopenia: Improving off of cellcept. Platelets improved,  now at 204,000. WBC up to 4.9. -Monitor CBC  Right MCA stroke: Occurred during inpatient stay, likely embolic. Patient has spoken at length with our physical therapists on the benefits of CIR vs limited home health sessions. When speaking with her today, she is favoring inpatient rehab and seems motivated to make progress with her mobility. -heparin bridging per pharm dosing before graft placement on 10/7. Holding coumadin. -Continue PT, appreciate all the help  Depression: Patient states she is understandably anxious about her procedure today. Will continue to monitor her mood and progress after procedure.  HTN: Continue current meds    Dispo: Disposition is deferred at this time, awaiting improvement of current medical problems.       LOS: 32 days   Zada Finders, MD 01/04/2015, 7:51 AM

## 2015-01-04 NOTE — Progress Notes (Signed)
Placed on tele to monitor  

## 2015-01-04 NOTE — PMR Pre-admission (Signed)
PMR Admission Coordinator Pre-Admission Assessment  Patient: [name] is an 39 y.o., female MRN: [id number] DOB: [birth year] Height: 5' 3" (160 cm) Weight: 98 kg (216 lb 0.8 oz)              Insurance Information HMO:No   PPO:       PCP:       IPA:       80/20:       OTHER:   PRIMARY:  Medicaid [place] access      Policy#: [id number]      Subscriber: Bettye Boeck CM Name:        Phone#:       Fax#:   Pre-Cert#:        Employer: Worked FT in Therapist, art Benefits:  Phone #: [phone]     Name:  Automated Eff. Date: Eligible 01/03/15     Deduct:        Out of Pocket Max:        Life Max:   CIR:        SNF:   Outpatient:       Co-Pay:   Home Health:        Co-Pay:   DME:       Co-Pay:   Providers:    Medicaid Application Date:        Case Manager:   Disability Application Date:        Case Worker:    Emergency Contact Information Contact Information    Name Relation Home Work Mobile   Davis,Nichole Sister   [phone]   Hilton,Denise Mother [phone]  [phone]     Current Medical History  Patient Admitting Diagnosis:  R MCA infarct with history of lupus  History of Present Illness: A 39 y.o. right hand female with history of fibromyalgia, SVT status post ablation, chronic anemia, hypertension, SLE with recently diagnosed lupus nephritis. Patient lives with her mother and 2 young children. Independent prior to admission. Presented 12/03/2014 with dysuria fever as well as nausea. Placed on broad-spectrum anti-biotic's. Creatinine elevated 8.65 followed by renal services. CT abdomen and pelvis showed a small chronic left retroperitoneal hematoma and chronic subcapsular hematoma. No findings to suggest recurrent hemorrhage. Underwent HD catheter placement per radiology services 12/06/2014 and dialysis initiated with permanent dialysis catheter 12/11/2014 per Dr. Trula Slade that required ligation of AV graft due to ischemic steal of left arm 12/19/2014 and later with  left thigh graft placement for HD 01/04/2015 per Dr. Donnetta Hutching. On 12/21/2014 patient became verbally less responsive left facial droop right eye deviation. Cranial CT scan negative. Neurology service is consulted. MRI of the brain showed right MCA infarct. MRA of the head negative. CT angiogram of the neck showed no extracranial cerebrovascular disease. Echocardiogram with ejection fraction of 84% grade 1 diastolic dysfunction. TEE with systolic normal function. There was a mobile density off the lateral aspect of the posterior mitral valve leaflet consistent with vegetation/mitral valve endocarditis and placed on vancomycin for total 3 weeks ending 01/22/2015. Patient currently maintained on Coumadin with bridging of heparin. Wound care nurse consulted 12/21/2014 for sacral ulcer with dressing changes as advised. Hospital course further complicated by tachycardia and Pseudomonas UTI with Ceftazadime completed 01/03/2015. Physical and occupational therapy evaluations completed 01/01/2015 with recommendations of physical medicine rehabilitation consult. Patient to be admitted for a comprehensive inpatient rehabilitation program.    Past Medical History  Past Medical History  Diagnosis Date  . Hypertension   . Cardiac arrhythmia   . SVT (supraventricular  tachycardia) ([place])     "today, last week, 2 wk ago; maybe 1 month ago, etc; started w/in last 3-4 yrs"(07/20/2012)  . Fainting     "~ 1 month ago; probably related to SVT" (07/20/2012)  . Shortness of breath     "related to SVT episodes" (07/20/2012)  . Chest pain at rest     "related to SVT" (07/20/2012)  . Lupus (systemic lupus erythematosus) ([place])   . GERD (gastroesophageal reflux disease)   . Fibromyalgia   . Irritable bowel syndrome (IBS)   . Hypercholesterolemia   . Heart murmur     "small" (12/25/2014)  . TIA (transient ischemic attack) 12/21/2014  . Sleep apnea     "don't wear mask anymore" (12/25/2014)  . Anemia   . History of blood  transfusion "several"    "related to low counts"  . Daily headache   . Arthritis     "hips" (12/25/2014)  . Anxiety     "sometimes; don't take anything for it" (12/25/2014)  . ESRD (end stage renal disease) on dialysis ([place]) since 12/07/2014    "I've been doing it here on [facility]" (12/25/2014)  . Chronic renal failure, stage 4 (severe) (HCC)   . Renal insufficiency     Family History  family history includes BRCA 1/2 in her sister; Cancer in her mother.  Prior Rehab/Hospitalizations: No previous rehab admissions  Has the patient had major surgery during 100 days prior to admission? No  Current Medications   Current facility-administered medications:  .   stroke: mapping our early stages of recovery book, , Does not apply, Once, Annia Belt, MD .  0.9 %  sodium chloride infusion, , Intravenous, Continuous, Myrtie Soman, MD, Last Rate: 20 mL/hr at 12/26/14 0915 .  0.9 %  sodium chloride infusion, , Intravenous, Once, Corky Sox, MD .  acetaminophen (TYLENOL) tablet 500 mg, 500 mg, Oral, Q6H PRN, Dellia Nims, MD, 500 mg at 12/24/14 0755 .  amLODipine (NORVASC) tablet 5 mg, 5 mg, Oral, QHS, Mauricia Area, MD .  aspirin EC tablet 81 mg, 81 mg, Oral, Daily, Collier Salina, MD, 81 mg at 01/04/15 1353 .  atorvastatin (LIPITOR) tablet 20 mg, 20 mg, Oral, q1800, Wynell Balloon, RPH, 20 mg at 01/03/15 1817 .  calcium acetate (PHOSLO) capsule 1,334 mg, 1,334 mg, Oral, TID WC, Corliss Parish, MD, 1,334 mg at 01/03/15 1817 .  cyclobenzaprine (FLEXERIL) tablet 10 mg, 10 mg, Oral, QHS, Collier Salina, MD, 10 mg at 01/03/15 2200 .  Darbepoetin Alfa (ARANESP) injection 100 mcg, 100 mcg, Intravenous, Q Thu-HD, Jamal Maes, MD, 100 mcg at 01/03/15 1040 .  feeding supplement (PRO-STAT SUGAR FREE 64) liquid 30 mL, 30 mL, Oral, Daily, Jenifer A Williams, RD, 30 mL at 01/04/15 1350 .  folic acid (FOLVITE) tablet 1 mg, 1 mg, Oral, Daily, Collier Salina, MD, 1 mg at 01/04/15 1354 .   gelatin adsorbable (GELFOAM/SURGIFOAM) sponge 12-7 mm 1 each, 1 each, Topical, Once PRN, Iline Oven, MD .  hydroxychloroquine (PLAQUENIL) tablet 400 mg, 400 mg, Oral, Daily, Michel Bickers, MD, 400 mg at 01/04/15 1354 .  isosorbide mononitrate (IMDUR) 24 hr tablet 30 mg, 30 mg, Oral, Daily, Collier Salina, MD, 30 mg at 01/03/15 1243 .  metoprolol succinate (TOPROL-XL) 24 hr tablet 25 mg, 25 mg, Oral, QHS, Mauricia Area, MD .  morphine 2 MG/ML injection 2 mg, 2 mg, Intravenous, Q1H PRN, Serafina Mitchell, MD, 2 mg at 12/18/14 2318 .  multivitamin (  RENA-VIT) tablet 1 tablet, 1 tablet, Oral, QHS, Jamal Maes, MD, 1 tablet at 01/03/15 2200 .  ondansetron (ZOFRAN) injection 4 mg, 4 mg, Intravenous, Once PRN, Lillia Abed, MD .  ondansetron (ZOFRAN-ODT) disintegrating tablet 4 mg, 4 mg, Oral, Q6H PRN, Dellia Nims, MD, 4 mg at 01/03/15 1245 .  oxyCODONE-acetaminophen (PERCOCET/ROXICET) 5-325 MG per tablet 1 tablet, 1 tablet, Oral, Q6H PRN, Collier Salina, MD, 1 tablet at 01/03/15 1243 .  pantoprazole (PROTONIX) EC tablet 20 mg, 20 mg, Oral, Daily, Collier Salina, MD, 20 mg at 01/04/15 1353 .  sodium chloride 0.9 % injection 10-40 mL, 10-40 mL, Intracatheter, PRN, Aldine Contes, MD, 10 mL at 01/04/15 0553 .  vancomycin (VANCOCIN) IVPB 1000 mg/200 mL premix, 1,000 mg, Intravenous, Q T,Th,Sa-HD, Darnell Level Mancheril, RPH, 1,000 mg at 01/03/15 1038  Patients Current Diet: Diet clear liquid Room service appropriate?: Yes; Fluid consistency:: Thin  Precautions / Restrictions Precautions Precautions: Fall Precaution Comments: Pt's femoral cath is tunneled therefore she is able to get OOB. Restrictions Weight Bearing Restrictions: No   Has the patient had 2 or more falls or a fall with injury in the past year?No.  Had 1 previous fall after passing out.  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  Rides the bus.  Goes  out daily.  Is questioning whether she will be able to return to work?  Home Assistive Devices / Equipment Home Assistive Devices/Equipment: None Home Equipment: Bedside commode, Walker - standard  Prior Device Use: Indicate devices/aids used by the patient prior to current illness, exacerbation or injury? None of the above.  Has access to a walker that belonged to her mother.  Prior Functional Level Prior Function Level of Independence: Independent  Self Care: Did the patient need help bathing, dressing, using the toilet or eating?  Independent  Indoor Mobility: Did the patient need assistance with walking from room to room (with or without device)? Independent  Stairs: Did the patient need assistance with internal or external stairs (with or without device)? Independent  Functional Cognition: Did the patient need help planning regular tasks such as shopping or remembering to take medications? Independent  Current Functional Level Cognition  Overall Cognitive Status: Within Functional Limits for tasks assessed Orientation Level: Oriented X4    Extremity Assessment (includes Sensation/Coordination)  Upper Extremity Assessment: Defer to OT evaluation LUE Deficits / Details: AROM shoulder flexion 0-110 degrees, all other joints AROM WFLS.  Strength 3+/5 in shoulder, elbow, and grip LUE Sensation:  (WFLs)  Lower Extremity Assessment: RLE deficits/detail, LLE deficits/detail RLE Deficits / Details: AROM ankle, knee WFL with strength 5/5 (isometric); AROM hip flexion 45 supine (limited by pain, however able to sit EOB at 90 degrees and felt "better") RLE Sensation:  (denies changes to light touch') LLE Deficits / Details: AROM WFL; ankle DF 5/5; PF 4/5 with ?clonus noted; knee extension 5/5 LLE Sensation:  (denies changes)    ADLs  Overall ADL's : Needs assistance/impaired Eating/Feeding: Modified independent, Bed level Grooming: Wash/dry hands, Wash/dry face, Set up,  Sitting Upper Body Bathing: Set up, Sitting Lower Body Bathing: Moderate assistance, Sitting/lateral leans Upper Body Dressing : Set up, Sitting Toilet Transfer: +2 for physical assistance, Minimal assistance, RW Toileting- Clothing Manipulation and Hygiene: Total assistance Functional mobility during ADLs: Minimal assistance, +2 for safety/equipment General ADL Comments: Limited eval secondary to pt still with femoral HD cathetor and on bedrest.  Pt issued red medium resistance theraputty  and exercise handout for strengthening.  Reviewed exercises and pt is able to complete them with supervision.      Mobility  Overal bed mobility: Needs Assistance Bed Mobility: Supine to Sit, Rolling Rolling: Supervision Supine to sit: HOB elevated, Supervision Sit to supine: Min assist General bed mobility comments: Incr time and use of rail    Transfers  Overall transfer level: Needs assistance Equipment used: Rolling walker (2 wheeled) Transfer via Lift Equipment: Stedy Transfers: Sit to/from Stand Sit to Stand: +2 physical assistance, Min assist Stand pivot transfers: +2 physical assistance, Min assist  Lateral/Scoot Transfers: Min assist General transfer comment: Assist to bring hips up.    Ambulation / Gait / Stairs / Wheelchair Mobility  Ambulation/Gait Ambulation/Gait assistance: +2 physical assistance, Min assist Ambulation Distance (Feet): 80 Feet (x 2) Assistive device: Rolling walker (2 wheeled) Gait Pattern/deviations: Step-through pattern, Decreased step length - right, Decreased step length - left, Staggering left General Gait Details: Assist for balance and stability Gait velocity: decr Gait velocity interpretation: Below normal speed for age/gender    Posture / Balance Dynamic Sitting Balance Sitting balance - Comments: x 10 minutes; able to shift weight for lateral scooting along EOB Balance Overall balance assessment: Needs assistance Sitting-balance support: No upper  extremity supported, Feet supported Sitting balance-Leahy Scale: Good Sitting balance - Comments: x 10 minutes; able to shift weight for lateral scooting along EOB Standing balance support: Bilateral upper extremity supported Standing balance-Leahy Scale: Poor Standing balance comment: walker and min A    Special needs/care consideration BiPAP/CPAP: Has sleep apnea, but does not have a CPAP machine CPM No Continuous Drip IV Heparin drip Dialysis Yes        Days Tu-Th-Sat Life Vest No Oxygen No Special Bed NO Trach Size NO Wound Vac (area) No    Skin Left arm incision from attempted fistula site. Has a sacral ulcer.                         Bowel mgmt: No BM since 12/23/14 Bladder mgmt: Using bedpan, continent Diabetic mgmt No    Previous Home Environment Living Arrangements: Children, Parent (17 yo son; 26 yo daughter) Type of Home: Apartment Home Layout: One level Home Access: Stairs to enter Entrance Stairs-Rails: None Entrance Stairs-Number of Steps: 1 (curb in parking lot) Bathroom Shower/Tub: Chiropodist: Standard Bathroom Accessibility: Yes How Accessible: Accessible via walker Home Care Services: No Additional Comments: walker was her mothers  Discharge Living Setting Plans for Discharge Living Setting: Lives with (comment), Apartment (Lives with mom and 2 children.) Type of Home at Discharge: Apartment Discharge Home Layout: One level Discharge Home Access: Stairs to enter Entrance Stairs-Number of Steps: 1 Discharge Bathroom Shower/Tub: Tub/shower unit Discharge Bathroom Toilet: Standard Discharge Bathroom Accessibility: Yes How Accessible: Accessible via walker Does the patient have any problems obtaining your medications?: No  Social/Family/Support Systems Patient Roles: Parent, Other (Comment) (Has a mom, 81 yo son and a 39 yo daughter.) Contact Information: Algis Greenhouse - mother Anticipated Caregiver: Mother Anticipated Caregiver's  Contact Information: Langley Gauss - mom [phone] Ability/Limitations of Caregiver: Lives with mom and mom has been assisting caring for children.  Mom does not work. Caregiver Availability: 24/7 Discharge Plan Discussed with Primary Caregiver: Yes Is Caregiver In Agreement with Plan?: Yes Does Caregiver/Family have Issues with Lodging/Transportation while Pt is in Rehab?: No  Goals/Additional Needs Patient/Family Goal for Rehab: PT/OT supervision to mod I goals Expected length of stay: 13-17 days  Cultural Considerations: None Dietary Needs: Renal, carb modified, fluid restricted diet Equipment Needs: TBD Special Service Needs: Graft being placed 10/07 for dialysis.  Has been clipped.  Patient has been told that she needs to arrange for [facility] transportation. Pt/Family Agrees to Admission and willing to participate: Yes Program Orientation Provided & Reviewed with Pt/Caregiver Including Roles  & Responsibilities: Yes  Decrease burden of Care through IP rehab admission: N/A  Possible need for SNF placement upon discharge: Not planned  Patient Condition: [name] medical and functional status has changed since the consult dated: 01/02/15 in which the Rehabilitation Physician determined and documented that the patient's condition is appropriate for intensive rehabilitative care in an inpatient rehabilitation facility. See "History of Present Illness" (above) for medical update. Functional changes are: Currently requiring min assist +2 to ambulate 80 ft RW. Patient's medical and functional status update has been discussed with the Rehabilitation physician and patient remains appropriate for inpatient rehabilitation. Will admit to inpatient rehab tomorrow, Saturday.  Preadmission Screen Completed By:  Retta Diones, 01/04/2015 2:25 PM ______________________________________________________________________   Discussed status with Dr. Naaman Plummer on 01/04/15 at 1425 and received telephone approval for  admission tomorrow, Saturday.  Admission Coordinator:  Retta Diones, time1425/Date10/07/16

## 2015-01-04 NOTE — Progress Notes (Addendum)
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 Food Swelling    Red peppers    Patient Measurements: Height: 5\' 3"  (160 cm) Weight: 216 lb 0.8 oz (98 kg) IBW/kg (Calculated) : 52.4   Vital Signs: Temp: 98.5 F (36.9 C) (10/07 1406) Temp Source: Oral (10/07 1406) BP: 136/75 mmHg (10/07 1406) Pulse Rate: 102 (10/07 1230)  Labs:  Recent Labs  01/02/15 0605  01/03/15 0500 01/03/15 0630 01/03/15 1432 01/04/15 0104  HGB 8.6*  --  9.0*  --   --  8.7*  HCT 27.5*  --  27.8*  --   --  27.7*  PLT 159  --  204  --   --  204  LABPROT 18.9*  --  19.2*  --   --  19.9*  INR 1.58*  --  1.61*  --   --  1.70*  HEPARINUNFRC  --   < >  --  0.54 1.01* 0.57  CREATININE 3.43*  --  3.84*  --   --  3.20*  < > = values in this interval not displayed.  Estimated Creatinine Clearance: 26.6 mL/min (by C-G formula based on Cr of 3.2).   Assessment: 17 yoF with R MCA stroke in setting of possible Rudene Christians endocarditis that was previously on warfarin from 09/28 - 10/4 and was subsequently stopped and transitioned to hep gtt for placement of Left femoral loop AV Gore-Tex graft. Heparin has been stopped. Pharmacy now to dose warfarin.  Goal of Therapy:  Monitor platelets by anticoagulation protocol: Yes   Plan:  -Give 5 mg of coumadin x1 tonight as appears to have responded well to this dose previously. -F/u response and adjust dose as needed -Daily INR -Following daily   Vincenza Hews, PharmD, BCPS 01/04/2015, 2:54 PM Pager: [phone]

## 2015-01-04 NOTE — Progress Notes (Signed)
Pt last BM 9/25. RN paged internal medicine and Melburn Hake, MD ordered senna stool softener. Pt offered medicine. Pt refused and stated "I don't want to take it. I think l can go once I sit on the toilet." Will continue to monitor pt.

## 2015-01-04 NOTE — Progress Notes (Signed)
Rehab admissions - Patient has now decided that she would like to come to inpatient rehab.  Noted patient to have a graft placed today.  Case manager questioned whether patient could potentially admit to inpatient rehab tomorrow.  I call attending resident and discussed the above.  Resident to call me back with whether patient could admit tomorrow versus Monday.  Call me for questions.  #[phone]

## 2015-01-05 ENCOUNTER — Inpatient Hospital Stay (HOSPITAL_COMMUNITY)
Admission: RE | Admit: 2015-01-05 | Discharge: 2015-01-17 | DRG: 056 | Disposition: A | Discharge status: Home Health | Payer: Medicaid Other | Source: Intra-hospital | Attending: Physical Medicine & Rehabilitation | Admitting: Physical Medicine & Rehabilitation

## 2015-01-05 DIAGNOSIS — R52 Pain, unspecified: Secondary | ICD-10-CM

## 2015-01-05 DIAGNOSIS — J9811 Atelectasis: Secondary | ICD-10-CM | POA: Diagnosis not present

## 2015-01-05 DIAGNOSIS — B965 Pseudomonas (aeruginosa) (mallei) (pseudomallei) as the cause of diseases classified elsewhere: Secondary | ICD-10-CM | POA: Diagnosis present

## 2015-01-05 DIAGNOSIS — I69398 Other sequelae of cerebral infarction: Secondary | ICD-10-CM

## 2015-01-05 DIAGNOSIS — K219 Gastro-esophageal reflux disease without esophagitis: Secondary | ICD-10-CM | POA: Diagnosis present

## 2015-01-05 DIAGNOSIS — I69392 Facial weakness following cerebral infarction: Secondary | ICD-10-CM | POA: Diagnosis not present

## 2015-01-05 DIAGNOSIS — M7989 Other specified soft tissue disorders: Secondary | ICD-10-CM | POA: Diagnosis not present

## 2015-01-05 DIAGNOSIS — R110 Nausea: Secondary | ICD-10-CM | POA: Diagnosis not present

## 2015-01-05 DIAGNOSIS — N390 Urinary tract infection, site not specified: Secondary | ICD-10-CM | POA: Diagnosis present

## 2015-01-05 DIAGNOSIS — E785 Hyperlipidemia, unspecified: Secondary | ICD-10-CM | POA: Diagnosis present

## 2015-01-05 DIAGNOSIS — G4730 Sleep apnea, unspecified: Secondary | ICD-10-CM | POA: Diagnosis present

## 2015-01-05 DIAGNOSIS — M797 Fibromyalgia: Secondary | ICD-10-CM | POA: Diagnosis present

## 2015-01-05 DIAGNOSIS — G8918 Other acute postprocedural pain: Secondary | ICD-10-CM | POA: Diagnosis present

## 2015-01-05 DIAGNOSIS — I471 Supraventricular tachycardia: Secondary | ICD-10-CM | POA: Diagnosis not present

## 2015-01-05 DIAGNOSIS — I120 Hypertensive chronic kidney disease with stage 5 chronic kidney disease or end stage renal disease: Secondary | ICD-10-CM | POA: Diagnosis present

## 2015-01-05 DIAGNOSIS — I059 Rheumatic mitral valve disease, unspecified: Secondary | ICD-10-CM | POA: Diagnosis present

## 2015-01-05 DIAGNOSIS — I058 Other rheumatic mitral valve diseases: Secondary | ICD-10-CM | POA: Diagnosis present

## 2015-01-05 DIAGNOSIS — N186 End stage renal disease: Secondary | ICD-10-CM | POA: Diagnosis present

## 2015-01-05 DIAGNOSIS — I69354 Hemiplegia and hemiparesis following cerebral infarction affecting left non-dominant side: Secondary | ICD-10-CM | POA: Diagnosis present

## 2015-01-05 DIAGNOSIS — G8194 Hemiplegia, unspecified affecting left nondominant side: Secondary | ICD-10-CM | POA: Diagnosis present

## 2015-01-05 DIAGNOSIS — R509 Fever, unspecified: Secondary | ICD-10-CM

## 2015-01-05 DIAGNOSIS — L89152 Pressure ulcer of sacral region, stage 2: Secondary | ICD-10-CM | POA: Diagnosis present

## 2015-01-05 DIAGNOSIS — Z992 Dependence on renal dialysis: Secondary | ICD-10-CM

## 2015-01-05 DIAGNOSIS — E7800 Pure hypercholesterolemia, unspecified: Secondary | ICD-10-CM | POA: Diagnosis present

## 2015-01-05 DIAGNOSIS — I639 Cerebral infarction, unspecified: Secondary | ICD-10-CM | POA: Diagnosis not present

## 2015-01-05 DIAGNOSIS — H518 Other specified disorders of binocular movement: Secondary | ICD-10-CM | POA: Diagnosis present

## 2015-01-05 DIAGNOSIS — F4321 Adjustment disorder with depressed mood: Secondary | ICD-10-CM | POA: Diagnosis present

## 2015-01-05 DIAGNOSIS — M3214 Glomerular disease in systemic lupus erythematosus: Secondary | ICD-10-CM | POA: Diagnosis not present

## 2015-01-05 DIAGNOSIS — K589 Irritable bowel syndrome without diarrhea: Secondary | ICD-10-CM | POA: Diagnosis present

## 2015-01-05 DIAGNOSIS — I63511 Cerebral infarction due to unspecified occlusion or stenosis of right middle cerebral artery: Secondary | ICD-10-CM | POA: Diagnosis not present

## 2015-01-05 DIAGNOSIS — B373 Candidiasis of vulva and vagina: Secondary | ICD-10-CM | POA: Diagnosis not present

## 2015-01-05 DIAGNOSIS — M79669 Pain in unspecified lower leg: Secondary | ICD-10-CM | POA: Diagnosis not present

## 2015-01-05 DIAGNOSIS — Z7901 Long term (current) use of anticoagulants: Secondary | ICD-10-CM

## 2015-01-05 LAB — COMPREHENSIVE METABOLIC PANEL
ALBUMIN: 2.1 g/dL — AB (ref 3.5–5.0)
ALT: 8 U/L — ABNORMAL LOW (ref 14–54)
AST: 37 U/L (ref 15–41)
Alkaline Phosphatase: 55 U/L (ref 38–126)
Anion gap: 9 (ref 5–15)
BUN: 15 mg/dL (ref 6–20)
CHLORIDE: 96 mmol/L — AB (ref 101–111)
CO2: 29 mmol/L (ref 22–32)
Calcium: 7.7 mg/dL — ABNORMAL LOW (ref 8.9–10.3)
Creatinine, Ser: 3.02 mg/dL — ABNORMAL HIGH (ref 0.44–1.00)
GFR calc Af Amer: 21 mL/min — ABNORMAL LOW (ref >60)
GFR, EST NON AFRICAN AMERICAN: 19 mL/min — AB (ref >60)
GLUCOSE: 94 mg/dL (ref 65–99)
POTASSIUM: 3.4 mmol/L — AB (ref 3.5–5.1)
SODIUM: 134 mmol/L — AB (ref 135–145)
Total Bilirubin: 0.3 mg/dL (ref 0.3–1.2)
Total Protein: 5.2 g/dL — ABNORMAL LOW (ref 6.5–8.1)

## 2015-01-05 LAB — CBC WITH DIFFERENTIAL/PLATELET
BASOS ABS: 0 10*3/uL (ref 0.0–0.1)
Basophils Relative: 0 %
EOS ABS: 0 10*3/uL (ref 0.0–0.7)
Eosinophils Relative: 0 %
HCT: 25.5 % — ABNORMAL LOW (ref 36.0–46.0)
Hemoglobin: 8.1 g/dL — ABNORMAL LOW (ref 12.0–15.0)
Lymphocytes Relative: 4 %
Lymphs Abs: 0.3 10*3/uL — ABNORMAL LOW (ref 0.7–4.0)
MCH: 29.7 pg (ref 26.0–34.0)
MCHC: 31.8 g/dL (ref 30.0–36.0)
MCV: 93.4 fL (ref 78.0–100.0)
MONO ABS: 0.2 10*3/uL (ref 0.1–1.0)
Monocytes Relative: 3 %
NEUTROS PCT: 93 %
Neutro Abs: 5.9 10*3/uL (ref 1.7–7.7)
PLATELETS: 226 10*3/uL (ref 150–400)
RBC: 2.73 MIL/uL — AB (ref 3.87–5.11)
RDW: 15.6 % — AB (ref 11.5–15.5)
WBC: 6.4 10*3/uL (ref 4.0–10.5)

## 2015-01-05 LAB — CBC
HCT: 24.4 % — ABNORMAL LOW (ref 36.0–46.0)
Hemoglobin: 7.6 g/dL — ABNORMAL LOW (ref 12.0–15.0)
MCH: 29.5 pg (ref 26.0–34.0)
MCHC: 31.1 g/dL (ref 30.0–36.0)
MCV: 94.6 fL (ref 78.0–100.0)
PLATELETS: 236 10*3/uL (ref 150–400)
RBC: 2.58 MIL/uL — ABNORMAL LOW (ref 3.87–5.11)
RDW: 16 % — AB (ref 11.5–15.5)
WBC: 6 10*3/uL (ref 4.0–10.5)

## 2015-01-05 LAB — RENAL FUNCTION PANEL
Albumin: 2.1 g/dL — ABNORMAL LOW (ref 3.5–5.0)
Anion gap: 10 (ref 5–15)
BUN: 38 mg/dL — AB (ref 6–20)
CHLORIDE: 101 mmol/L (ref 101–111)
CO2: 26 mmol/L (ref 22–32)
Calcium: 8.6 mg/dL — ABNORMAL LOW (ref 8.9–10.3)
Creatinine, Ser: 4.69 mg/dL — ABNORMAL HIGH (ref 0.44–1.00)
GFR calc Af Amer: 13 mL/min — ABNORMAL LOW (ref >60)
GFR, EST NON AFRICAN AMERICAN: 11 mL/min — AB (ref >60)
Glucose, Bld: 105 mg/dL — ABNORMAL HIGH (ref 65–99)
POTASSIUM: 4.8 mmol/L (ref 3.5–5.1)
Phosphorus: 4.8 mg/dL — ABNORMAL HIGH (ref 2.5–4.6)
Sodium: 137 mmol/L (ref 135–145)

## 2015-01-05 LAB — PHOSPHORUS: PHOSPHORUS: 2.5 mg/dL (ref 2.5–4.6)

## 2015-01-05 LAB — PROTIME-INR
INR: 1.62 — ABNORMAL HIGH (ref 0.00–1.49)
PROTHROMBIN TIME: 19.2 s — AB (ref 11.6–15.2)

## 2015-01-05 LAB — VANCOMYCIN, RANDOM: VANCOMYCIN RM: 7 ug/mL

## 2015-01-05 MED ORDER — VANCOMYCIN HCL 1000 MG IV SOLR: 1250.0000 mg | INTRAVENOUS | Status: DC

## 2015-01-05 MED ORDER — SODIUM CHLORIDE 0.9 % IV SOLN: 100.0000 mL | INTRAVENOUS | Status: DC | PRN

## 2015-01-05 MED ORDER — HEPARIN SODIUM (PORCINE) 1000 UNIT/ML DIALYSIS
1000.0000 [IU] | INTRAMUSCULAR | Status: DC | PRN
  Filled 2015-01-05: qty 1

## 2015-01-05 MED ORDER — OXYCODONE-ACETAMINOPHEN 5-325 MG PO TABS
1.0000 | ORAL_TABLET | Freq: Four times a day (QID) | ORAL | Status: DC | PRN
  Administered 2015-01-05 – 2015-01-16 (×7): 1 via ORAL
  Filled 2015-01-05 (×6): qty 1

## 2015-01-05 MED ORDER — VANCOMYCIN HCL IN DEXTROSE 1-5 GM/200ML-% IV SOLN
1000.0000 mg | Freq: Once | INTRAVENOUS | Status: DC
  Filled 2015-01-05: qty 200

## 2015-01-05 MED ORDER — PENTAFLUOROPROP-TETRAFLUOROETH EX AERO: 1.0000 "application " | INHALATION_SPRAY | CUTANEOUS | Status: DC | PRN

## 2015-01-05 MED ORDER — HYDROXYCHLOROQUINE SULFATE 200 MG PO TABS
400.0000 mg | ORAL_TABLET | Freq: Every day | ORAL | Status: DC
  Administered 2015-01-06 – 2015-01-17 (×12): 400 mg via ORAL
  Filled 2015-01-05 (×13): qty 2

## 2015-01-05 MED ORDER — WARFARIN SODIUM 5 MG PO TABS: 5.0000 mg | ORAL_TABLET | Freq: Once | ORAL | Status: DC

## 2015-01-05 MED ORDER — SODIUM CHLORIDE 0.9 % IJ SOLN
10.0000 mL | INTRAMUSCULAR | Status: DC | PRN
  Administered 2015-01-05: 10 mL
  Administered 2015-01-07 – 2015-01-09 (×3): 20 mL
  Administered 2015-01-10 (×2): 10 mL
  Administered 2015-01-11 – 2015-01-12 (×3): 20 mL
  Administered 2015-01-13 – 2015-01-15 (×6): 10 mL
  Administered 2015-01-15: 20 mL
  Administered 2015-01-16: 10 mL
  Filled 2015-01-05 (×16): qty 40

## 2015-01-05 MED ORDER — PRO-STAT SUGAR FREE PO LIQD
30.0000 mL | Freq: Every day | ORAL | Status: DC
  Administered 2015-01-09 – 2015-01-16 (×4): 30 mL via ORAL
  Filled 2015-01-05 (×8): qty 30

## 2015-01-05 MED ORDER — AMLODIPINE BESYLATE 5 MG PO TABS
5.0000 mg | ORAL_TABLET | Freq: Every day | ORAL | Status: DC
  Administered 2015-01-05: 5 mg via ORAL
  Filled 2015-01-05: qty 1

## 2015-01-05 MED ORDER — METOPROLOL SUCCINATE ER 25 MG PO TB24
25.0000 mg | ORAL_TABLET | Freq: Every day | ORAL | Status: DC
  Administered 2015-01-05 – 2015-01-16 (×12): 25 mg via ORAL
  Filled 2015-01-05 (×12): qty 1

## 2015-01-05 MED ORDER — ONDANSETRON HCL 4 MG PO TABS
4.0000 mg | ORAL_TABLET | Freq: Four times a day (QID) | ORAL | Status: DC | PRN
  Administered 2015-01-06 – 2015-01-15 (×12): 4 mg via ORAL
  Filled 2015-01-05 (×12): qty 1

## 2015-01-05 MED ORDER — ACETAMINOPHEN 500 MG PO TABS: 500.0000 mg | ORAL_TABLET | Freq: Four times a day (QID) | ORAL | Status: DC | PRN

## 2015-01-05 MED ORDER — VANCOMYCIN HCL IN DEXTROSE 1-5 GM/200ML-% IV SOLN: 1000.0000 mg | INTRAVENOUS | Status: DC

## 2015-01-05 MED ORDER — CALCIUM ACETATE (PHOS BINDER) 667 MG PO CAPS
1334.0000 mg | ORAL_CAPSULE | Freq: Three times a day (TID) | ORAL | Status: DC
  Administered 2015-01-05 – 2015-01-09 (×9): 1334 mg via ORAL
  Filled 2015-01-05 (×17): qty 2

## 2015-01-05 MED ORDER — PANTOPRAZOLE SODIUM 20 MG PO TBEC: 20.0000 mg | DELAYED_RELEASE_TABLET | Freq: Every day | ORAL | Status: DC

## 2015-01-05 MED ORDER — WARFARIN - PHARMACIST DOSING INPATIENT
Freq: Every day | Status: DC
  Administered 2015-01-05 – 2015-01-11 (×2)

## 2015-01-05 MED ORDER — ISOSORBIDE MONONITRATE ER 30 MG PO TB24
30.0000 mg | ORAL_TABLET | Freq: Every day | ORAL | Status: DC
  Administered 2015-01-06 – 2015-01-17 (×12): 30 mg via ORAL
  Filled 2015-01-05 (×12): qty 1

## 2015-01-05 MED ORDER — RENA-VITE PO TABS
1.0000 | ORAL_TABLET | Freq: Every day | ORAL | Status: DC
  Administered 2015-01-05 – 2015-01-16 (×11): 1 via ORAL
  Filled 2015-01-05 (×11): qty 1

## 2015-01-05 MED ORDER — AMLODIPINE BESYLATE 5 MG PO TABS
5.0000 mg | ORAL_TABLET | Freq: Every day | ORAL | Status: DC
Start: 2015-01-05 — End: 2015-01-17

## 2015-01-05 MED ORDER — ALTEPLASE 2 MG IJ SOLR: 2.0000 mg | Freq: Once | INTRAMUSCULAR | Status: DC | PRN

## 2015-01-05 MED ORDER — ATORVASTATIN CALCIUM 20 MG PO TABS
20.0000 mg | ORAL_TABLET | Freq: Every day | ORAL | Status: DC
  Administered 2015-01-05 – 2015-01-16 (×11): 20 mg via ORAL
  Filled 2015-01-05 (×12): qty 1

## 2015-01-05 MED ORDER — VANCOMYCIN HCL IN DEXTROSE 1-5 GM/200ML-% IV SOLN
1000.0000 mg | Freq: Once | INTRAVENOUS | Status: AC
  Administered 2015-01-05: 1000 mg via INTRAVENOUS
  Filled 2015-01-05: qty 200

## 2015-01-05 MED ORDER — WARFARIN SODIUM 5 MG PO TABS
5.0000 mg | ORAL_TABLET | Freq: Once | ORAL | Status: AC
  Administered 2015-01-05: 5 mg via ORAL
  Filled 2015-01-05: qty 1

## 2015-01-05 MED ORDER — ASPIRIN EC 81 MG PO TBEC
81.0000 mg | DELAYED_RELEASE_TABLET | Freq: Every day | ORAL | Status: DC
  Administered 2015-01-06 – 2015-01-08 (×3): 81 mg via ORAL
  Filled 2015-01-05 (×4): qty 1

## 2015-01-05 MED ORDER — HEPARIN SODIUM (PORCINE) 1000 UNIT/ML DIALYSIS
40.0000 [IU]/kg | Freq: Once | INTRAMUSCULAR | Status: DC
  Filled 2015-01-05: qty 4

## 2015-01-05 MED ORDER — FOLIC ACID 1 MG PO TABS: 1.0000 mg | ORAL_TABLET | Freq: Every day | ORAL | Status: DC

## 2015-01-05 MED ORDER — DARBEPOETIN ALFA 100 MCG/0.5ML IJ SOSY: 100.0000 ug | PREFILLED_SYRINGE | INTRAMUSCULAR | Status: DC

## 2015-01-05 MED ORDER — SORBITOL 70 % SOLN
30.0000 mL | Freq: Every day | Status: DC | PRN
  Administered 2015-01-05: 30 mL via ORAL
  Filled 2015-01-05 (×2): qty 30

## 2015-01-05 MED ORDER — CYCLOBENZAPRINE HCL 10 MG PO TABS
10.0000 mg | ORAL_TABLET | Freq: Every day | ORAL | Status: DC
  Administered 2015-01-05 – 2015-01-16 (×12): 10 mg via ORAL
  Filled 2015-01-05 (×12): qty 1

## 2015-01-05 MED ORDER — PANTOPRAZOLE SODIUM 20 MG PO TBEC
20.0000 mg | DELAYED_RELEASE_TABLET | Freq: Every day | ORAL | Status: DC
  Administered 2015-01-06 – 2015-01-17 (×12): 20 mg via ORAL
  Filled 2015-01-05 (×13): qty 1

## 2015-01-05 MED ORDER — ASPIRIN 81 MG PO TBEC: 81.0000 mg | DELAYED_RELEASE_TABLET | Freq: Every day | ORAL | Status: DC

## 2015-01-05 MED ORDER — RENA-VITE PO TABS: 1.0000 | ORAL_TABLET | Freq: Every day | ORAL | Status: DC

## 2015-01-05 MED ORDER — ISOSORBIDE MONONITRATE ER 30 MG PO TB24: 30.0000 mg | ORAL_TABLET | Freq: Every day | ORAL | Status: DC

## 2015-01-05 MED ORDER — FOLIC ACID 1 MG PO TABS
1.0000 mg | ORAL_TABLET | Freq: Every day | ORAL | Status: DC
Start: 2015-01-05 — End: 2015-01-17

## 2015-01-05 MED ORDER — HEPARIN SODIUM (PORCINE) 1000 UNIT/ML DIALYSIS: 1000.0000 [IU] | INTRAMUSCULAR | Status: DC | PRN

## 2015-01-05 MED ORDER — LIDOCAINE HCL (PF) 1 % IJ SOLN
5.0000 mL | INTRAMUSCULAR | Status: DC | PRN
  Filled 2015-01-05: qty 5

## 2015-01-05 MED ORDER — HYDROXYCHLOROQUINE SULFATE 200 MG PO TABS: 400.0000 mg | ORAL_TABLET | Freq: Every day | ORAL | Status: DC

## 2015-01-05 MED ORDER — LIDOCAINE HCL (PF) 1 % IJ SOLN
5.0000 mL | INTRAMUSCULAR | Status: DC | PRN
Start: 2015-01-05 — End: 2015-01-08

## 2015-01-05 MED ORDER — ONDANSETRON HCL 4 MG/2ML IJ SOLN
4.0000 mg | Freq: Four times a day (QID) | INTRAMUSCULAR | Status: DC | PRN
  Administered 2015-01-12 – 2015-01-13 (×2): 4 mg via INTRAVENOUS
  Filled 2015-01-05 (×2): qty 2

## 2015-01-05 MED ORDER — ALTEPLASE 2 MG IJ SOLR
2.0000 mg | Freq: Once | INTRAMUSCULAR | Status: DC | PRN
  Filled 2015-01-05: qty 2

## 2015-01-05 MED ORDER — HEPARIN SODIUM (PORCINE) 1000 UNIT/ML DIALYSIS
40.0000 [IU]/kg | Freq: Once | INTRAMUSCULAR | Status: DC
Start: 2015-01-05 — End: 2015-01-08
  Filled 2015-01-05: qty 4

## 2015-01-05 MED ORDER — VANCOMYCIN HCL 1000 MG IV SOLR
1250.0000 mg | INTRAVENOUS | Status: DC
  Administered 2015-01-08 – 2015-01-17 (×5): 1250 mg via INTRAVENOUS
  Filled 2015-01-05 (×10): qty 1250

## 2015-01-05 MED ORDER — LIDOCAINE-PRILOCAINE 2.5-2.5 % EX CREA
1.0000 "application " | TOPICAL_CREAM | CUTANEOUS | Status: DC | PRN
  Filled 2015-01-05: qty 5

## 2015-01-05 MED ORDER — LIDOCAINE-PRILOCAINE 2.5-2.5 % EX CREA: 1.0000 "application " | TOPICAL_CREAM | CUTANEOUS | Status: DC | PRN

## 2015-01-05 NOTE — Progress Notes (Signed)
Currently still in HD at this time. Report given for transfer for CIP. Will transfer from HD to 4 MW room 01. HD nurse made aware.

## 2015-01-05 NOTE — Progress Notes (Signed)
Pt arrived on floor. Settled into room with all belongings. Oriented to unit. Resting comfortably.

## 2015-01-05 NOTE — Progress Notes (Signed)
Antibiotic Time-Out Note  [name] is a 39 y.o. year-old female admitted on 12/03/2014.  The patient is currently on vancomycin for endocarditis.   Recent Labs Lab 01/01/15 0450 01/02/15 0605 01/03/15 0500 01/04/15 0104 01/05/15 0405  WBC 1.9* 2.0* 3.4* 4.9 6.0     Recent Labs Lab 01/01/15 0450 01/02/15 0605 01/03/15 0500 01/04/15 0104 01/05/15 0405  CREATININE 4.50* 3.43* 3.84* 3.20* 4.69*   Estimated Creatinine Clearance: 19.9 mL/min (by C-G formula based on Cr of 4.69). 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 A preHD level came back today at 7 so it's subtherapeutic. The plan is to cont vanc til 10/25. Going to rebolus pt today.   Plan:  Additional vanc 1 g x 1 today for a total of 2g Increase post HD dose to 1.25g IV qHD til 10/25  [name], PharmD Pager: [phone] 01/05/2015 4:17 PM

## 2015-01-05 NOTE — Progress Notes (Signed)
Patient ID: [name], female   DOB: Oct 01, 1975, 39 y.o.   MRN: [id number]  01/05/15.   HPI: [name] is a 39 y.o. right hand female with history of fibromyalgia, SVT status post ablation, chronic anemia, hypertension, SLE with recently diagnosed lupus nephritis. Patient lives with her mother and 2 young children. Independent prior to admission. Presented 12/03/2014 with dysuria fever as well as nausea. Placed on broad-spectrum anti-biotic's. Creatinine elevated 8.65 followed by renal services. CT abdomen and pelvis showed a small chronic left retroperitoneal hematoma and chronic subcapsular hematoma. No findings to suggest recurrent hemorrhage. Underwent HD catheter placement per radiology services 12/06/2014 and dialysis initiated with permanent dialysis catheter 12/11/2014 per Dr. Trula Slade that required ligation of AV graft due to ischemic steal of left arm 12/19/2014 and later with left thigh graft placement for HD 01/04/2015 per Dr. Donnetta Hutching. On 12/21/2014 patient became verbally less responsive left facial droop right eye deviation. Cranial CT scan negative. Neurology service is consulted. MRI of the brain showed right MCA infarct. MRA of the head negative. CT angiogram of the neck showed no extracranial cerebrovascular disease. Echocardiogram with ejection fraction of 78% grade 1 diastolic dysfunction. TEE with systolic normal function. There was a mobile density off the lateral aspect of the posterior mitral valve leaflet consistent with vegetation/mitral valve endocarditis and placed on vancomycin for total 3 weeks ending 01/22/2015. Patient currently maintained on Coumadin. Wound care nurse consulted 12/21/2014 for sacral ulcer with dressing changes as advised. Hospital course further complicated by tachycardia and Pseudomonas UTI with Ceftazadime completed 01/03/2015. Physical and occupational therapy evaluations completed 01/01/2015 with recommendations of physical medicine rehabilitation  consult. Patient was admitted for a comprehensive rehabilitation program    ROS Constitutional: Negative for fever and chills.  Eyes: Negative for blurred vision and double vision.  Respiratory: Negative for cough.   Shortness of breath on exertion  Cardiovascular: Positive for leg swelling. Negative for chest pain.  Gastrointestinal: Negative for vomiting.   GERD  Genitourinary: Negative for dysuria.  Skin: Negative for rash. 2 areas of stage II pressure injuries. Foam dressing in place without odor Neurological: Positive for weakness. Negative for seizures and loss of consciousness   Past Medical History  Diagnosis Date  . Hypertension   . Cardiac arrhythmia   . SVT (supraventricular tachycardia)     "today, last week, 2 wk ago; maybe 1 month ago, etc; started w/in last 3-4 yrs"(07/20/2012)  . Fainting     "~ 1 month ago; probably related to SVT" (07/20/2012)  . Shortness of breath     "related to SVT episodes" (07/20/2012)  . Chest pain at rest     "related to SVT" (07/20/2012)  . Lupus (systemic lupus erythematosus)   . GERD (gastroesophageal reflux disease)   . Fibromyalgia   . Irritable bowel syndrome (IBS)   . Hypercholesterolemia   . Heart murmur     "small" (12/25/2014)  . TIA (transient ischemic attack) 12/21/2014  . Sleep apnea     "don't wear mask anymore" (12/25/2014)  . Anemia   . History of blood transfusion "several"    "related to low counts"  . Daily headache   . Arthritis     "hips" (12/25/2014)  . Anxiety     "sometimes; don't take anything for it" (12/25/2014)  . ESRD (end stage renal disease) on dialysis since 12/07/2014    "I've been doing it here on MWS" (12/25/2014)  . Chronic renal failure, stage 4 (severe)   . Renal insufficiency  Past Surgical History  Procedure Laterality Date  . Cesarean section  2001; 2010  . Cervical  biopsy  2013  . Supraventricular tachycardia ablation  07/21/12     Dr. Cristopher Peru   . Peripheral vascular catheterization N/A 12/14/2014    Procedure: Upper Extremity Venography; Surgeon: Elam Dutch, MD; Location: [place] [facility]; Service: Cardiovascular; Laterality: N/A;  . Insertion hybrid anteriovenous gortex graft Left 12/18/2014    Procedure: INSERTION GORE HYBRID ARTERIOVENOUS GRAFT LEFT AXILLO-BRACHIAL.; Surgeon: Serafina Mitchell, MD; Location: [facility] OR; Service: Vascular; Laterality: Left;  . Ligation of arteriovenous fistula Left 12/19/2014    Procedure: LIGATION OF FISTULA; Surgeon: Elam Dutch, MD; Location: [place]; Service: Vascular; Laterality: Left;  . Appendectomy  2011  . Tubal ligation  2010  . Tee without cardioversion N/A 12/25/2014    Procedure: TRANSESOPHAGEAL ECHOCARDIOGRAM (TEE); Surgeon: Sueanne Margarita, MD; Location: [facility] ENDOSCOPY; Service: Cardiovascular; Laterality: N/A;       Alert, comfortable, no complaints except constipation   Pfull hysical Exam: Blood pressure 133/70, pulse 99, temperature 99.5 F (37.5 C), temperature source Oral, resp. rate 18, height 5\' 3"  (1.6 m), weight 100 kg (220 lb 7.4 oz), last menstrual period 11/17/2014, SpO2 98 %. Physical Exam Constitutional: She is oriented to person, place, and time. She appears well-developed and well-nourished.  Obese  HENT:  Head: Normocephalic and atraumatic.  Mild left facial droop  Eyes: Conjunctivae and EOM are normal.  Neck: Normal range of motion. Neck supple. No thyromegaly present.  Cardiovascular: Normal rate.  No murmur heard. Cardiac rate- slight resting tachycardia Respiratory: Effort normal and breath sounds normal. No respiratory distress.  GI: Soft. Bowel sounds are normal. She exhibits no distension.  Musculoskeletal:  RUE 4+/5 grossly LUE 4/5 grossly  RLE proximal 3-/5, distally 4/5 grossly LLE proximal  3-/5, distally 4/5 grossly  Neurological: She is alert and oriented to person, place, and time. No cranial nerve deficit.  +Homan's LUE Sensation to light touch intact  Skin: Skin is warm and dry.  HD catheter L  Upper leg  Medical Problem List and Plan: 1. Functional deficits secondary to right MCA infarct with history of lupus/multi-medical 2. DVT Prophylaxis/Anticoagulation: Coumadin per pharmacy protocol and no bridging required. Monitor for any bleeding episodes 3. Pain Management: Oxycodone and flexeril as needed. Monitor with increased mobility 4. Lupus nephritis with end-stage renal disease. Continue hemodialysis as advised. Permanent access graft placement 01/04/2015.  5. Fluids/Electrolytes/Nutrition: Strict I&O with follow-up chemistries 6. Endocarditis of mitral valve. Unable to rule out infectious endocarditis as patient is immunosuppressed and leukopenic. Blood cultures negative. Plan vancomycin for 3 more weeks until 01/22/2015. 7 Hypertension. Norvasc 5 mg daily, Imdur or 30 mg daily, Toprol-XL 25 mg daily at bedtime. Monitor with increased mobility 8. Pseudomonas urinary tract infection. Ceftazadime completed 01/03/2015 9. Morbid obesity. Dietary follow-up 10. Sacral decubitus. Foam dressing change every 3 days as needed pressure release 11 Hyperlipidemia. Lipitor

## 2015-01-05 NOTE — Progress Notes (Signed)
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 Patient was seen while inpatient on 5W today. Orders per previous notes from today.  [place], Pharm.D., BCPS Clinical Pharmacist Pager: [phone] 01/05/2015 5:17 PM

## 2015-01-05 NOTE — Progress Notes (Cosign Needed)
   Subjective: Patient feels well, motivated to start rehab.  Objective: Vital signs in last 24 hours: Filed Vitals:   01/04/15 1307 01/04/15 1406 01/04/15 2103 01/05/15 0527  BP:  136/75 130/71 134/79  Pulse:   103 100  Temp: 97.5 F (36.4 C) 98.5 F (36.9 C) 98.4 F (36.9 C) 98.5 F (36.9 C)  TempSrc:  Oral Oral Oral  Resp:  18 20 18   Height:      Weight:    253 lb (114.76 kg)  SpO2:   98% 99%   Weight change: 36 lb 15.2 oz (16.76 kg)  Intake/Output Summary (Last 24 hours) at 01/05/15 1116 Last data filed at 01/05/15 0646  Gross per 24 hour  Intake    723 ml  Output      0 ml  Net    723 ml   General: resting in bed Cardiac: RRR Lungs: clear to auscultation anteriorly Neuro: alert and oriented X3   Assessment/Plan: Principal Problem:   ESRD (end stage renal disease) on dialysis ([place]) Active Problems:   Hypertension   Lupus nephritis (HCC)   UTI (lower urinary tract infection)   Retroperitoneal hematoma   Steal syndrome as complication of dialysis access (HCC)   Pressure ulcer   Other pancytopenia (HCC)   Endocarditis of mitral valve   Anemia due to bone marrow failure (HCC)   Right middle cerebral artery stroke (HCC)  Lupus nephritis with ESRD: S/p permanent access graft on left thigh (10/7). -Continue current management and TTS HD per nephrology  Endocarditis of mitral valve: Possibly Libman Sacks or marantic, but unable to rule out infectious endocarditis as patient is immunosuppressed and leukopenic. Blood cultures were negative, however may be false as they were drawn after patient received antibiotics.  -Continue Vancomycin per pharm dosing for total 3 weeks (end 10/25) -Restarted Coumadin last night, will likely need to be on coumadin indefinitely as SLE associated marantic endocarditis may have reoccurrence at anytime of of anticoagulation and is not correlated with disease activity of lupus. Dr. Beryle Beams has recommended a target INR of 2.0-2.5 for  now and can discontinue ASA once INR therapeutic. -f/u Echo in 6 weeks to 3 months  Pancytopenia: Improving off of cellcept. Platelets improved, now at 236,000. WBC up to 6.0. -Monitor CBC  Right MCA stroke: Occurred during inpatient stay, likely embolic. Plan to join inpatient rehab today. -Continue PT and begin inpatient rehab.  Depression: Patient's mood seems more optimistic today, she feels motivated for rehab and is gaining trust in herself to make progress.   Dispo: Discharge to CIR today.       LOS: 33 days   Zada Finders, MD 01/05/2015, 11:16 AM

## 2015-01-05 NOTE — Progress Notes (Signed)
Subjective: Interval History: has no complaint , discussed not showering with fem cath.  Objective: Vital signs in last 24 hours: Temp:  [97.5 F (36.4 C)-99.8 F (37.7 C)] 98.5 F (36.9 C) (10/08 0527) Pulse Rate:  [100-103] 100 (10/08 0527) Resp:  [13-20] 18 (10/08 0527) BP: (126-136)/(71-79) 134/79 mmHg (10/08 0527) SpO2:  [93 %-100 %] 99 % (10/08 0527) FiO2 (%):  [32 %] 32 % (10/07 1225) Weight:  [114.76 kg (253 lb)] 114.76 kg (253 lb) (10/08 0527) Weight change: 16.76 kg (36 lb 15.2 oz)  Intake/Output from previous day: 10/07 0701 - 10/08 0700 In: 973 [P.O.:100; I.V.:873] Out: -  Intake/Output this shift:    General appearance: alert, cooperative and morbidly obese Resp: diminished breath sounds bilaterally Cardio: S1, S2 normal and systolic murmur: holosystolic 2/6, blowing at apex GI: pos bs, soft, obese Extremities: 1+ edema, R fem cath. L fem AVG B&T  Lab Results:  Recent Labs  01/04/15 0104 01/05/15 0405  WBC 4.9 6.0  HGB 8.7* 7.6*  HCT 27.7* 24.4*  PLT 204 236   BMET:  Recent Labs  01/04/15 0104 01/05/15 0405  NA 137 137  K 4.1 4.8  CL 101 101  CO2 26 26  GLUCOSE 78 105*  BUN 25* 38*  CREATININE 3.20* 4.69*  CALCIUM 8.7* 8.6*   No results for input(s): PTH in the last 72 hours. Iron Studies: No results for input(s): IRON, TIBC, TRANSFERRIN, FERRITIN in the last 72 hours.  Studies/Results: No results found.  I have reviewed the patient's current medications.  Assessment/Plan: 1 ESRD for HD.  Mild vol xs 2 Access new groin avg 3 Obesity 4 SLE  reeval comp, antiDS DNA soon 5 Anemai esa/Fe 6 HPTH vit D 7 BP coming down , lower meds 8 endocarditis getting reanticoag P HD, esa, anticoag, mobilize     LOS: 33 days   [name] L 01/05/2015,9:09 AM

## 2015-01-05 NOTE — Progress Notes (Addendum)
[facility]  Subjective  - Doing well over all   Objective 134/79 100 98.5 F (36.9 C) (Oral) 18 99%  Intake/Output Summary (Last 24 hours) at 01/05/15 0931 Last data filed at 01/05/15 0646  Gross per 24 hour  Intake    973 ml  Output      0 ml  Net    973 ml    Left thigh graft palpable thrill Groin without hematoma  Assessment/Planning: POD #1 Left femoral loop AV Gore-Tex graft  The graft may be used in 4 weeks F/U PRN with Dr. Fae Pippin, [facility] 01/05/2015 9:31 AM --  Laboratory Lab Results:  Recent Labs  01/04/15 0104 01/05/15 0405  WBC 4.9 6.0  HGB 8.7* 7.6*  HCT 27.7* 24.4*  PLT 204 236   BMET  Recent Labs  01/04/15 0104 01/05/15 0405  NA 137 137  K 4.1 4.8  CL 101 101  CO2 26 26  GLUCOSE 78 105*  BUN 25* 38*  CREATININE 3.20* 4.69*  CALCIUM 8.7* 8.6*    COAG Lab Results  Component Value Date   INR 1.62* 01/05/2015   INR 1.70* 01/04/2015   INR 1.61* 01/03/2015   No results found for: PTT    I have examined the patient, reviewed and agree with above.  Curt Jews, MD 01/05/2015 11:41 AM

## 2015-01-05 NOTE — Progress Notes (Signed)
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 Food Swelling    Red peppers    Patient Measurements: Height: 5\' 3"  (160 cm) Weight: 253 lb (114.76 kg) IBW/kg (Calculated) : 52.4   Vital Signs: Temp: 98.5 F (36.9 C) (10/08 0527) Temp Source: Oral (10/08 0527) BP: 134/79 mmHg (10/08 0527) Pulse Rate: 100 (10/08 0527)  Labs:  Recent Labs  01/03/15 0500 01/03/15 0630 01/03/15 1432 01/04/15 0104 01/05/15 0405  HGB 9.0*  --   --  8.7* 7.6*  HCT 27.8*  --   --  27.7* 24.4*  PLT 204  --   --  204 236  LABPROT 19.2*  --   --  19.9* 19.2*  INR 1.61*  --   --  1.70* 1.62*  HEPARINUNFRC  --  0.54 1.01* 0.57  --   CREATININE 3.84*  --   --  3.20* 4.69*    Estimated Creatinine Clearance: 19.9 mL/min (by C-G formula based on Cr of 4.69).   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 Heparin has been stopped. Pharmacy now to dose warfarin. Received 5 mg*1, INR 1.62.  Goal of Therapy:  Monitor platelets by anticoagulation protocol: Yes   Plan:  - Give 5 mg of coumadin x1 tonight -F/u response and adjust dose as needed -Daily INR   Melburn Popper, PharmD Clinical Pharmacy Resident Pager: [phone] 01/05/2015 8:28 AM

## 2015-01-06 ENCOUNTER — Inpatient Hospital Stay (HOSPITAL_COMMUNITY): Payer: Medicaid Other | Admitting: Physical Therapy

## 2015-01-06 ENCOUNTER — Inpatient Hospital Stay (HOSPITAL_COMMUNITY): Payer: Medicaid Other | Admitting: Occupational Therapy

## 2015-01-06 DIAGNOSIS — I63511 Cerebral infarction due to unspecified occlusion or stenosis of right middle cerebral artery: Secondary | ICD-10-CM

## 2015-01-06 LAB — HEPATITIS B SURFACE ANTIBODY,QUALITATIVE: Hep B S Ab: NONREACTIVE

## 2015-01-06 LAB — HEPATITIS B SURFACE ANTIGEN: HEP B S AG: NEGATIVE

## 2015-01-06 LAB — PROTIME-INR
INR: 1.78 — ABNORMAL HIGH (ref 0.00–1.49)
PROTHROMBIN TIME: 20.7 s — AB (ref 11.6–15.2)

## 2015-01-06 MED ORDER — ACETAMINOPHEN 500 MG PO TABS
500.0000 mg | ORAL_TABLET | Freq: Four times a day (QID) | ORAL | Status: DC | PRN
  Administered 2015-01-06 – 2015-01-13 (×7): 500 mg via ORAL
  Filled 2015-01-06 (×7): qty 1

## 2015-01-06 MED ORDER — WARFARIN SODIUM 5 MG PO TABS
5.0000 mg | ORAL_TABLET | Freq: Once | ORAL | Status: AC
  Administered 2015-01-06: 5 mg via ORAL
  Filled 2015-01-06: qty 1

## 2015-01-06 MED ORDER — BISACODYL 5 MG PO TBEC
5.0000 mg | DELAYED_RELEASE_TABLET | Freq: Two times a day (BID) | ORAL | Status: DC
  Administered 2015-01-06 – 2015-01-15 (×14): 5 mg via ORAL
  Filled 2015-01-06 (×20): qty 1

## 2015-01-06 MED ORDER — DARBEPOETIN ALFA 200 MCG/0.4ML IJ SOSY
200.0000 ug | PREFILLED_SYRINGE | INTRAMUSCULAR | Status: DC
  Administered 2015-01-10 – 2015-01-17 (×2): 200 ug via INTRAVENOUS
  Filled 2015-01-06 (×2): qty 0.4

## 2015-01-06 NOTE — Progress Notes (Signed)
Patient ID: [name], female   DOB: [birth year], 39 y.o.   MRN: [id number]   Patient ID: [name], female   DOB: Aug 27, 1975, 39 y.o.   MRN: [id number]  01/06/15.   HPI: [name] is a 39 y.o. right hand female with history of fibromyalgia, SVT status post ablation, chronic anemia, hypertension, SLE with recently diagnosed lupus nephritis. Presented 12/03/2014 with dysuria fever as well as nausea.   Underwent HD catheter placement per radiology services 12/06/2014 and dialysis initiated with permanent dialysis catheter 12/11/2014 per Dr. Trula Slade that required ligation of AV graft due to ischemic steal of left arm 12/19/2014 and later with left thigh graft placement for HD 01/04/2015 per Dr. Donnetta Hutching.  On 12/21/2014 patient became verbally less responsive left facial droop right eye deviation. Cranial CT scan negative. Neurology service is consulted. MRI of the brain showed right MCA infarct.  There was a mobile density off the lateral aspect of the posterior mitral valve leaflet consistent with vegetation/mitral valve endocarditis and placed on vancomycin for total 3 weeks ending 01/22/2015. Patient currently maintained on Coumadin. Wound care nurse consulted 12/21/2014 for sacral ulcer with dressing changes as advised. Hospital course further complicated by tachycardia and Pseudomonas UTI with Ceftazadime completed 01/03/2015.  Physical and occupational therapy evaluations completed 01/01/2015 with recommendations of physical medicine rehabilitation consult. Patient was admitted for a comprehensive rehabilitation program  Feels well this am;  Arrived 4M01 yesterday after HD; remains on CL diet. Hgh down to 7.6 gms%  Lab Results  Component Value Date   INR 1.78* 01/06/2015   INR 1.62* 01/05/2015   INR 1.70* 01/04/2015     ROS Constitutional: Negative for fever and chills.  Eyes: Negative for blurred vision and double vision.  Respiratory: Negative for cough.   Shortness  of breath on exertion  Cardiovascular: Positive for leg swelling. Negative for chest pain.  Gastrointestinal: Negative for vomiting.   GERD  Genitourinary: Negative for dysuria.  Skin: Negative for rash. 2 areas of stage II pressure injuries. Foam dressing in place without odor Neurological: Positive for weakness. Negative for seizures and loss of consciousness   Past Medical History  Diagnosis Date  . Hypertension   . Cardiac arrhythmia   . SVT (supraventricular tachycardia)     "today, last week, 2 wk ago; maybe 1 month ago, etc; started w/in last 3-4 yrs"(07/20/2012)  . Fainting     "~ 1 month ago; probably related to SVT" (07/20/2012)  . Shortness of breath     "related to SVT episodes" (07/20/2012)  . Chest pain at rest     "related to SVT" (07/20/2012)  . Lupus (systemic lupus erythematosus)   . GERD (gastroesophageal reflux disease)   . Fibromyalgia   . Irritable bowel syndrome (IBS)   . Hypercholesterolemia   . Heart murmur     "small" (12/25/2014)  . TIA (transient ischemic attack) 12/21/2014  . Sleep apnea     "don't wear mask anymore" (12/25/2014)  . Anemia   . History of blood transfusion "several"    "related to low counts"  . Daily headache   . Arthritis     "hips" (12/25/2014)  . Anxiety     "sometimes; don't take anything for it" (12/25/2014)  . ESRD (end stage renal disease) on dialysis since 12/07/2014    "I've been doing it here on MWS" (12/25/2014)  . Chronic renal failure, stage 4 (severe)   . Renal insufficiency    Past Surgical History  Procedure Laterality Date  .  Cesarean section  2001; 2010  . Cervical biopsy  2013  . Supraventricular tachycardia ablation  07/21/12     Dr. Cristopher Peru   . Peripheral vascular catheterization N/A 12/14/2014    Procedure: Upper Extremity Venography; Surgeon: Elam Dutch, MD;  Location: [place] [facility]; Service: Cardiovascular; Laterality: N/A;  . Insertion hybrid anteriovenous gortex graft Left 12/18/2014    Procedure: INSERTION GORE HYBRID ARTERIOVENOUS GRAFT LEFT AXILLO-BRACHIAL.; Surgeon: Serafina Mitchell, MD; Location: [facility] OR; Service: Vascular; Laterality: Left;  . Ligation of arteriovenous fistula Left 12/19/2014    Procedure: LIGATION OF FISTULA; Surgeon: Elam Dutch, MD; Location: [place]; Service: Vascular; Laterality: Left;  . Appendectomy  2011  . Tubal ligation  2010  . Tee without cardioversion N/A 12/25/2014    Procedure: TRANSESOPHAGEAL ECHOCARDIOGRAM (TEE); Surgeon: Sueanne Margarita, MD; Location: [facility] ENDOSCOPY; Service: Cardiovascular; Laterality: N/A;       Alert, comfortable, no complaints except constipation   Physical Exam: Blood pressure 133/70, pulse 99, temperature 99.5 F (37.5 C), temperature source Oral, resp. rate 18, height 5\' 3"  (1.6 m), weight 100 kg (220 lb 7.4 oz), last menstrual period 11/17/2014, SpO2 98 %. Physical Exam Constitutional: She is oriented to person, place, and time. She appears well-developed and well-nourished.  Obese  HENT:  Head: Normocephalic and atraumatic.  Mild left facial droop  Eyes: Conjunctivae and EOM are normal.  Neck: Normal range of motion. Neck supple. No thyromegaly present.  Cardiovascular: Normal rate.  No murmur heard. Cardiac rate- slight resting tachycardia Respiratory: Effort normal and breath sounds normal. No respiratory distress.  GI: Soft. Bowel sounds are normal. She exhibits no distension.  Musculoskeletal:  RUE 4+/5 grossly LUE 4/5 grossly  RLE proximal 3-/5, distally 4/5 grossly LLE proximal 3-/5, distally 4/5 grossly  Neurological: She is alert and oriented to person, place, and time. No cranial nerve deficit.  +Homan's LUE Sensation to light touch intact  Skin: Skin is warm and dry.  Maturing HD catheter L  Upper  leg Extr- no edema   Medical Problem List and Plan: 1. Functional deficits secondary to right MCA infarct with history of lupus/multi-medical 2. DVT Prophylaxis/Anticoagulation: Coumadin per pharmacy protocol and no bridging required. Monitor for any bleeding episodes 3. Pain Management: Oxycodone and flexeril as needed. Monitor with increased mobility 4. Lupus nephritis with end-stage renal disease. Continue hemodialysis as advised. Permanent access graft placement 01/04/2015.  5. Fluids/Electrolytes/Nutrition: Strict I&O with follow-up chemistries 6. Endocarditis of mitral valve. Unable to rule out infectious endocarditis as patient is immunosuppressed and leukopenic. Blood cultures negative. Plan vancomycin for 3 more weeks until 01/22/2015. 7 Hypertension. Norvasc 5 mg daily, Imdur or 30 mg daily, Toprol-XL 25 mg daily at bedtime. Monitor with increased mobility 8. Pseudomonas urinary tract infection. Ceftazadime completed 01/03/2015 9. Morbid obesity. Dietary follow-up 10. Sacral decubitus. Foam dressing change every 3 days as needed pressure release 11 Hyperlipidemia. Lipitor

## 2015-01-06 NOTE — Progress Notes (Signed)
Temp 99.2 at 1430. MD notified, tylenol order changed to include fever. Tylenol given. Will continue to monitor.

## 2015-01-06 NOTE — Evaluation (Signed)
Physical Therapy Assessment and Plan  Patient Details  Name: [name] MRN: [id number] Date of Birth: 1976/01/14  PT Diagnosis: Abnormal posture, Difficulty walking, Hemiparesis non-dominant, Muscle weakness and Pain in leg Rehab Potential: Good ELOS: 10-14 days   Today's Date: 01/06/2015 PT Individual Time:  - 0800-0900, 60 min      Problem List:  Patient Active Problem List   Diagnosis Date Noted  . Right middle cerebral artery stroke ([place]) 01/02/2015  . Anemia due to bone marrow failure ([place])   . 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 Other pancytopenia ([place])   . Pressure ulcer 12/21/2014  . Steal syndrome as complication of dialysis access ([place])   . ESRD (end stage renal disease) on dialysis ([place])   . UTI (lower urinary tract infection)   . Retroperitoneal hematoma   . Lupus nephritis ([place])   . Hypertension 07/20/2012  . Weight loss, unintentional 07/20/2012    Past Medical History:  Past Medical History  Diagnosis Date  . Hypertension   . Cardiac arrhythmia   . SVT (supraventricular tachycardia) ([place])     "today, last week, 2 wk ago; maybe 1 month ago, etc; started w/in last 3-4 yrs"(07/20/2012)  . Fainting     "~ 1 month ago; probably related to SVT" (07/20/2012)  . Shortness of breath     "related to SVT episodes" (07/20/2012)  . Chest pain at rest     "related to SVT" (07/20/2012)  . Lupus (systemic lupus erythematosus) ([place])   . GERD (gastroesophageal reflux disease)   . Fibromyalgia   . Irritable bowel syndrome (IBS)   . Hypercholesterolemia   . Heart murmur     "small" (12/25/2014)  . TIA (transient ischemic attack) 12/21/2014  . Sleep apnea     "don't wear mask anymore" (12/25/2014)  . Anemia   . History of blood transfusion "several"    "related to low counts"  . Daily headache   . Arthritis     "hips" (12/25/2014)  . Anxiety     "sometimes; don't take anything for it" (12/25/2014)  . ESRD (end stage renal disease) on dialysis ([place]) since  12/07/2014    "I've been doing it here on [facility]" (12/25/2014)  . Chronic renal failure, stage 4 (severe) ([facility])   . Renal insufficiency    Past Surgical History:  Past Surgical History  Procedure Laterality Date  . Cesarean section  2001; 2010  . Cervical biopsy  2013  . Supraventricular tachycardia ablation  07/21/12     Dr. Cristopher Peru   . Peripheral vascular catheterization N/A 12/14/2014    Procedure: Upper Extremity Venography;  Surgeon: Elam Dutch, MD;  Location: [facility];  Service: Cardiovascular;  Laterality: N/A;  . Insertion hybrid anteriovenous gortex graft Left 12/18/2014    Procedure: INSERTION GORE HYBRID ARTERIOVENOUS GRAFT LEFT AXILLO-BRACHIAL.;  Surgeon: Serafina Mitchell, MD;  Location: [facility];  Service: Vascular;  Laterality: Left;  . Ligation of arteriovenous  fistula Left 12/19/2014    Procedure: LIGATION OF FISTULA;  Surgeon: Elam Dutch, MD;  Location: [facility];  Service: Vascular;  Laterality: Left;  . Appendectomy  2011  . Tubal ligation  2010  . Tee without cardioversion N/A 12/25/2014    Procedure: TRANSESOPHAGEAL ECHOCARDIOGRAM (TEE);  Surgeon: Sueanne Margarita, MD;  Location: [facility] END[facility];  Service: Cardiovascular;  Laterality: N/A;    Assessment & Plan Clinical Impression: Patient is a 39 y.o. year old female with recent admission to the hospital on 12/03/14  with retroperitoneal and subscapular hematoma complicated by MCA CVA, sacral ulcer, and UTI.  Patient transferred to CIR on 01/05/2015 .   Patient currently requires min with mobility secondary to muscle weakness and muscle joint tightness, decreased cardiorespiratoy endurance and impaired timing and sequencing and unbalanced muscle activation.  Prior to hospitalization, patient was independent  with mobility and lived with  children and mother in a [place] home.  Home access is 1 (curb in parking lot)Stairs to enter.  Patient will benefit from skilled PT intervention to maximize safe functional  mobility, minimize fall risk and decrease caregiver burden for planned discharge home with 24 hour supervision.  Anticipate patient will benefit from follow up [place] at discharge.  PT - End of Session Endurance Deficit: Yes PT Assessment Rehab Potential (ACUTE/IP ONLY): Good PT Patient demonstrates impairments in the following area(s): Balance;Endurance;Motor;Pain;Safety;Skin Integrity PT Transfers Functional Problem(s): Bed Mobility;Bed to Chair;Furniture;Floor;Car PT Locomotion Functional Problem(s): Ambulation;Wheelchair Mobility;Stairs PT Plan PT Intensity: Minimum of 1-2 x/day ,45 to 90 minutes PT Frequency: Total of 15 hours over 7 days of combined therapies PT Duration Estimated Length of Stay: 10-14 days PT Treatment/Interventions: DME/adaptive equipment instruction;UE/LE Strength taining/ROM;Ambulation/gait training;Balance/vestibular training;Functional electrical stimulation;UE/LE Coordination activities;Skin care/wound management;Psychosocial support;Functional mobility training;Community reintegration;Neuromuscular re-education;Stair training;Wheelchair propulsion/positioning;Therapeutic Activities;Disease management/prevention;Patient/family education;Therapeutic Exercise;Pain management;Discharge planning;Splinting/orthotics;Visual/perceptual remediation/compensation PT Transfers Anticipated Outcome(s): Mod I PT Locomotion Anticipated Outcome(s): Mod I PT Recommendation Follow Up Recommendations: Home health PT Patient destination: Home Equipment Recommended: To be determined  Skilled Therapeutic Intervention Pt performs transfers x5 in session. Pt performs transfers 2x10 LAQs, marching, anterior weight shifts, and ankle pumps. Pt educated on rehab plan, safety in mobility, progressing mobility, rehab prognosis, and anticipated disposition.  PT Evaluation Precautions/Restrictions Precautions Precautions: Fall Precaution Comments: Pt's femoral cath is tunneled therefore she is  able to get OOB. Restrictions Weight Bearing Restrictions: No General Chart Reviewed: Yes Vital SignsTherapy Vitals Temp: 98 F (36.7 C) Temp Source: Oral Pulse Rate: (!) 109 Resp: 20 BP: 110/64 mmHg Patient Position (if appropriate): Sitting (s/p activity) Oxygen Therapy SpO2: 100 % O2 Device: Not Delivered Pain Pain Assessment Pain Assessment: 0-10 Pain Score: 3  Pain Location: Leg Pain Orientation: Left Pain Intervention(s):  (graded activity) Home Living/Prior Functioning Home Living Type of Home: Apartment Home Access: Stairs to enter [employer] of Steps: 1 (curb in parking lot) Entrance Stairs-Rails: None Home Layout: One level Bathroom Shower/Tub: Chiropodist: Standard Bathroom Accessibility: Yes Additional Comments: walker was her mothers Prior Function Level of Independence: Independent with basic ADLs;Independent with homemaking with ambulation  Able to Take Stairs?: Yes Driving: No Vocation: Full time employment (customer service) Leisure: Hobbies-yes (Comment) Comments: traveling and shopping Cognition Overall Cognitive Status: Within Functional Limits for tasks assessed Comments: alert and multi-unit commands Sensation Sensation Light Touch: Appears Intact Motor  Motor Motor: Abnormal postural alignment and control;Hemiplegia  Mobility Bed Mobility Bed Mobility: Rolling Left;Supine to Sit Rolling Left: 4: Min guard Supine to Sit: 3: Mod assist Supine to Sit Details (indicate cue type and reason): with cues for weight shift and technique Transfers Transfers: Yes Sit to Stand: 3: Mod assist Sit to Stand Details (indicate cue type and reason): with cues for weight shift, hand placement, and technique Stand Pivot Transfers: 3: Mod assist Stand Pivot Transfer Details (indicate cue type and reason): with cues for weight shift, hand placement, and technique Locomotion  Ambulation Ambulation: Yes Ambulation Distance  (Feet): 60 Feet Assistive device: Rolling walker Stairs / Additional Locomotion Stairs: Yes Stairs Assistance: 3: Mod assist Stairs  Assistance Details (indicate cue type and reason): with cues for posture, weight shift,and technique Stair Management Technique: Two rails Number of Stairs: 2 Curb: Not tested (comment) (unsafe)  Trunk/Postural Assessment  Cervical Assessment Cervical Assessment: Exceptions to [facility] (hypomobility) Thoracic Assessment Thoracic Assessment: Exceptions to [facility] (hypomobility) Lumbar Assessment Lumbar Assessment: Exceptions to [facility] (hypomobility)  Balance Balance Balance Assessed: Yes Static Sitting Balance Static Sitting - Balance Support: Feet supported Static Sitting - Level of Assistance: 5: Stand by assistance Dynamic Sitting Balance Dynamic Sitting - Balance Support: Feet supported Dynamic Sitting - Level of Assistance: 5: Stand by assistance Static Standing Balance Static Standing - Level of Assistance: 4: Min assist Dynamic Standing Balance Dynamic Standing - Level of Assistance: 4: Min assist Extremity Assessment  RUE Assessment RUE Assessment: Exceptions to [facility] RUE AROM (degrees) Overall AROM Right Upper Extremity: Within functional limits for tasks performed RUE Strength RUE Overall Strength: Deficits RUE Overall Strength Comments: grossly 4/5 LUE Assessment LUE Assessment: Exceptions to WFL LUE AROM (degrees) Overall AROM Left Upper Extremity: Deficits LUE Overall AROM Comments: grossly WFL except Shoulder to 120 degrees abd/flex LUE Strength LUE Overall Strength: Deficits LUE Overall Strength Comments: grossly 3+/5 except shoulder 3-/5 RLE Assessment RLE Assessment: Exceptions to [facility] RLE AROM (degrees) Overall AROM Right Lower Extremity: Within functional limits for tasks assessed RLE Strength RLE Overall Strength: Deficits RLE Overall Strength Comments: grossly 4/5 LLE Assessment LLE Assessment: Exceptions to WFL LLE AROM  (degrees) Overall AROM Left Lower Extremity: Deficits LLE Overall AROM Comments: grossly WFL except ankle DF to 5 degrees LLE Strength LLE Overall Strength: Deficits LLE Overall Strength Comments: grossly 3+/5 except ankle DF 3-/5   See Function Navigator for Current Functional Status.   Refer to Care Plan for Long Term Goals  Recommendations for other services: None  Discharge Criteria: Patient will be discharged from PT if patient refuses treatment 3 consecutive times without medical reason, if treatment goals not met, if there is a change in medical status, if patient makes no progress towards goals or if patient is discharged from hospital.  The above assessment, treatment plan, treatment alternatives and goals were discussed and mutually agreed upon: by patient  Monia Pouch 01/06/2015, 8:59 AM

## 2015-01-06 NOTE — Progress Notes (Signed)
Subjective: Interval History: has complaints tired after PT.  Objective: Vital signs in last 24 hours: Temp:  [97.5 F (36.4 C)-98.3 F (36.8 C)] 98 F (36.7 C) (10/09 0500) Pulse Rate:  [90-109] 109 (10/09 0826) Resp:  [17-22] 20 (10/09 0500) BP: (99-126)/(45-76) 110/64 mmHg (10/09 0826) SpO2:  [100 %] 100 % (10/09 0500) Weight:  [114.306 kg (252 lb)] 114.306 kg (252 lb) (10/09 0500) Weight change:   Intake/Output from previous day:   Intake/Output this shift:    General appearance: alert, cooperative and moderately obese Resp: clear to auscultation bilaterally Cardio: S1, S2 normal and systolic murmur: holosystolic 2/6, blowing at apex GI: obese, pos bs, liver down 5cm Extremities: HD cath R groin, new avg L groin, B&T  Lab Results:  Recent Labs  01/05/15 0405 01/05/15 2110  WBC 6.0 6.4  HGB 7.6* 8.1*  HCT 24.4* 25.5*  PLT 236 226   BMET:  Recent Labs  01/05/15 0405 01/05/15 2110  NA 137 134*  K 4.8 3.4*  CL 101 96*  CO2 26 29  GLUCOSE 105* 94  BUN 38* 15  CREATININE 4.69* 3.02*  CALCIUM 8.6* 7.7*   No results for input(s): PTH in the last 72 hours. Iron Studies: No results for input(s): IRON, TIBC, TRANSFERRIN, FERRITIN in the last 72 hours.  Studies/Results: No results found.  I have reviewed the patient's current medications.  Assessment/Plan: 1 ESRD HD yest.  Doing well , New access ok 2 HTN lower , stop amlod. Vol much better 3 SVT in past,cont low dose beta blocker 4 SLE reeval in about 1 wk with comp, Anti DS DNA 5 Endocarditis  ? Libman Saks 6 Obesity needs cal limits 7 Pressure Ulcer 8 Anemia ^ esa 9 CVA rehab P HD TTS,  Stop amlodipine, cont low dose beta blocker, anticoag, ^ ESA    LOS: 1 day   [name] L 01/06/2015,9:13 AM

## 2015-01-06 NOTE — Progress Notes (Signed)
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 Food Swelling    Red peppers    Patient Measurements: Height: 5\' 3"  (160 cm) Weight: 252 lb (114.306 kg) IBW/kg (Calculated) : 52.4   Vital Signs: Temp: 98 F (36.7 C) (10/09 0500) Temp Source: Oral (10/09 0500) BP: 110/64 mmHg (10/09 0826) Pulse Rate: 109 (10/09 0826)  Labs:  Recent Labs  01/03/15 1432  01/04/15 0104 01/05/15 0405 01/05/15 2110 01/06/15 0608  HGB  --   < > 8.7* 7.6* 8.1*  --   HCT  --   --  27.7* 24.4* 25.5*  --   PLT  --   --  204 236 226  --   LABPROT  --   --  19.9* 19.2*  --  20.7*  INR  --   --  1.70* 1.62*  --  1.78*  HEPARINUNFRC 1.01*  --  0.57  --   --   --   CREATININE  --   --  3.20* 4.69* 3.02*  --   < > = values in this interval not displayed.  Estimated Creatinine Clearance: 30.8 mL/min (by C-G formula based on Cr of 3.02).   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 Heparin has been stopped and warfarin restarted on 10/7.  INR = 1.78, Plt 226, Hgb 8.1, no reports of bleeding  Goal of Therapy:  Monitor platelets by anticoagulation protocol: Yes   Plan:  -warfarin 5 mg po x1 tonight -F/u response and adjust dose as needed -Daily INR, CBC q72h   Governor Specking, PharmD Clinical Pharmacy Resident Pager: [phone]  01/06/2015 10:19 AM

## 2015-01-06 NOTE — Evaluation (Signed)
Occupational Therapy Assessment and Plan  Patient Details  Name: [name] MRN: [id number] Date of Birth: [birth year]  OT Diagnosis: abnormal posture, hemiplegia affecting non-dominant side and muscle weakness (generalized) Rehab Potential: Rehab Potential (ACUTE ONLY): Good ELOS: 10-14 days   Today's Date: 01/06/2015 OT Individual Time:  -   1100-1215   (75 min))       Problem List:  Patient Active Problem List   Diagnosis Date Noted  . 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 Anemia due to bone marrow failure ([place])   . 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 Other pancytopenia ([place])   . Pressure ulcer 12/21/2014  . Steal syndrome as complication of dialysis access ([place])   . ESRD (end stage renal disease) on dialysis ([place])   . UTI (lower urinary tract infection)   . Retroperitoneal hematoma   . Lupus nephritis ([place])   . Hypertension 07/20/2012  . Weight loss, unintentional 07/20/2012    Past Medical History:  Past Medical History  Diagnosis Date  . Hypertension   . Cardiac arrhythmia   . SVT (supraventricular tachycardia) ([place])     "today, last week, 2 wk ago; maybe 1 month ago, etc; started w/in last 3-4 yrs"(07/20/2012)  . Fainting     "~ 1 month ago; probably related to SVT" (07/20/2012)  . Shortness of breath     "related to SVT episodes" (07/20/2012)  . Chest pain at rest     "related to SVT" (07/20/2012)  . Lupus (systemic lupus erythematosus) ([place])   . GERD (gastroesophageal reflux disease)   . Fibromyalgia   . Irritable bowel syndrome (IBS)   . Hypercholesterolemia   . Heart murmur     "small" (12/25/2014)  . TIA (transient ischemic attack) 12/21/2014  . Sleep apnea     "don't wear mask anymore" (12/25/2014)  . Anemia   . History of blood transfusion "several"    "related to low counts"  . Daily headache   . Arthritis     "hips" (12/25/2014)  . Anxiety     "sometimes; don't take anything for it" (12/25/2014)  . ESRD (end stage renal  disease) on dialysis ([place]) since 12/07/2014    "I've been doing it here on [facility]" (12/25/2014)  . Chronic renal failure, stage 4 (severe) ([facility])   . Renal insufficiency    Past Surgical History:  Past Surgical History  Procedure Laterality Date  . Cesarean section  2001; 2010  . Cervical biopsy  2013  . Supraventricular tachycardia ablation  07/21/12     Dr. Cristopher Peru   . Peripheral vascular catheterization N/A 12/14/2014    Procedure: Upper Extremity Venography;  Surgeon: Elam Dutch, MD;  Location: [facility];  Service: Cardiovascular;  Laterality: N/A;  . Insertion hybrid anteriovenous gortex graft Left 12/18/2014    Procedure: INSERTION GORE HYBRID ARTERIOVENOUS GRAFT LEFT AXILLO-BRACHIAL.;  Surgeon: Serafina Mitchell, MD;  Location[facility] OR;  Service: Vascular;  Laterality: Left;  . Ligation of arteriovenous  fistula Left 12/19/2014    Procedure: LIGATION OF FISTULA;  Surgeon: Elam Dutch, MD;  Location: [facility];  Service: Vascular;  Laterality: Left;  . Appendectomy  2011  . Tubal ligation  2010  . Tee without cardioversion N/A 12/25/2014    Procedure: TRANSESOPHAGEAL ECHOCARDIOGRAM (TEE);  Surgeon: Sueanne Margarita, MD;  Location: [facility] ENDOSCOPY;  Service: Cardiovascular;  Laterality: N/A;    Assessment & Plan Clinical Impression: Clinical Impression: Patient is a 39 y.o. year old  female with recent admission to the hospital on 12/03/14 with retroperitoneal and subscapular hematoma complicated by MCA CVA, sacral ulcer, and UTI. Patient transferred to CIR on 01/05/2015 .  Patient currently requires min with mobility secondary to muscle weakness and muscle joint tightness, decreased cardiorespiratoy endurance and impaired timing and sequencing and unbalanced muscle activation. Prior to hospitalization, patient was independent  with mobility and lived with  children and mother in a [place] home. Home access is 1 (curb in parking lot)Stairs to enter.   Patient transferred to CIR on  01/05/2015 .    Patient currently requires mod with basic self-care skills and IADL secondary to muscle weakness and decreased coordination.  Prior to hospitalization, patient could complete BADL and IADL with independent .  Patient will benefit from skilled intervention to increase independence with basic self-care skills and increase level of independence with iADL prior to discharge home with care partner.  Anticipate patient will require intermittent supervision and follow up home health.  OT - End of Session Activity Tolerance: Decreased this session;Tolerates < 10 min activity, no significant change in vital signs Endurance Deficit: Yes Endurance Deficit Description: frequent rest breaks during ADL session OT Assessment Rehab Potential (ACUTE ONLY): Good Barriers to Discharge:  (none) OT Plan OT Intensity: Minimum of 1-2 x/day, 45 to 90 minutes OT Frequency: 5 out of 7 days OT Duration/Estimated Length of Stay: 10-14 days OT Treatment/Interventions: Balance/vestibular training;Discharge planning;DME/adaptive equipment instruction;Functional mobility training;Patient/family education;Self Care/advanced ADL retraining;Therapeutic Activities;Therapeutic Exercise;UE/LE Strength taining/ROM OT Recommendation Recommendations for Other Services:  (none) Patient destination: Home Follow Up Recommendations: Home health OT Equipment Recommended: Tub/shower bench   Skilled Therapeutic Intervention Addressed functional transfers, sit to stand, standing tolerance during bathiing, dressing, and toilet transfers.  Pt unable to shower due to incisions on groin area.  She reported MD told her not to shower.  Went over OT goals, pt's goals, and POC.    OT Evaluation Precautions/Restrictions  Precautions Precautions: Fall Precaution Comments: Pt's femoral cath is tunneled therefore she is able to get OOB. Restrictions Weight Bearing Restrictions: No    Vital Signs Therapy Vitals Temp: 99.2  F (37.3 C) Temp Source: Oral Pulse Rate: (!) 105 Resp: 18 BP: 114/61 mmHg Patient Position (if appropriate): Lying Oxygen Therapy SpO2: 99 % O2 Device: Not Delivered Pain :  Nausea  3/10 stomach   Home Living/Prior Functioning Home Living Available Help at Discharge: Family Type of Home: Apartment Home Access: Stairs to enter Technical brewer of Steps: 1 Entrance Stairs-Rails: Left, Can reach both Home Layout: One level Bathroom Shower/Tub: Tub/shower unit, Curtain (HAS TUB BENCH; FIXED SHOWER HEAD) Bathroom Toilet: Standard Bathroom Accessibility: Yes Additional Comments:  (RW With 2 wheels)  Lives With:  (lives with MOm stays at home and 2 children (6 and 15)) IADL History Homemaking Responsibilities: Yes Meal Prep Responsibility: Secondary Laundry Responsibility: Primary Cleaning Responsibility: Secondary Bill Paying/Finance Responsibility: Secondary Shopping Responsibility: Secondary Current License: No Mode of Transportation: Friends, Cab, Bus 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 of Occupation:  (works at call center) Leisure and Hobbies:  (crossword puzzles, reading magazines, listen to music) Prior Function Level of Independence: Independent with basic ADLs, Independent with homemaking with ambulation  Able to Take Stairs?: Yes Driving: No Vocation: Full time employment Leisure: Hobbies-yes (Comment) Comments: traveling and shopping ADL   Vision/Perception  Vision- History Baseline Vision/History: No visual deficits Patient Visual Report: No change from baseline Vision- Assessment Vision Assessment?: No apparent visual deficits  Cognition Overall  Cognitive Status: Within Functional Limits for tasks assessed Arousal/Alertness: Awake/alert Orientation Level: Situation;Place;Person Person: Oriented Place: Oriented Situation: Oriented Year: 2016 Month: October Day of Week: Correct Memory: Appears  intact Immediate Memory Recall: Blue;Sock;Bed Memory Recall: Blue Memory Recall Blue: Without Cue (Recalled sock and bed with cue) Attention: Selective Selective Attention: Appears intact Awareness: Appears intact Safety/Judgment: Appears intact Sensation Sensation Light Touch: Appears Intact Coordination Gross Motor Movements are Fluid and Coordinated: Yes Fine Motor Movements are Fluid and Coordinated: Yes Motor  Motor Motor: Abnormal postural alignment and control Motor - Skilled Clinical Observations:  (upper thoracic flexion) Mobility  Bed Mobility Bed Mobility: Rolling Left;Left Sidelying to Sit;Supine to Sit Rolling Left: 4: Min guard Left Sidelying to Sit: 4: Min guard Supine to Sit: HOB elevated;4: Min assist Supine to Sit Details: Verbal cues for precautions/safety Transfers Sit to Stand: 3: Mod assist Sit to Stand Details: Verbal cues for safe use of DME/AE  Trunk/Postural Assessment  Cervical Assessment Cervical Assessment: Exceptions to [facility] Cervical Strength Overall Cervical Strength Comments:  (cervical forward) Thoracic Assessment Thoracic Assessment: Exceptions to [facility] Thoracic Strength Overall Thoracic Strength: Deficits Lumbar Assessment Lumbar Assessment: Exceptions to [facility] Postural Control Postural Control: Deficits on evaluation Postural Limitations: decreased strength  Balance Balance Balance Assessed: Yes Static Sitting Balance Static Sitting - Balance Support: Feet supported Static Sitting - Level of Assistance: 5: Stand by assistance Dynamic Sitting Balance Dynamic Sitting - Balance Support: Feet supported Dynamic Sitting - Level of Assistance: 5: Stand by assistance Dynamic Sitting - Balance Activities: Reaching for objects;Reaching across midline Sitting balance - Comments:  (unable to reach feet during bathing and dressing) Static Standing Balance Static Standing - Balance Support: Left upper extremity supported;During functional  activity Static Standing - Level of Assistance: 4: Min assist Dynamic Standing Balance Dynamic Standing - Balance Support: Left upper extremity supported;During functional activity Dynamic Standing - Level of Assistance: 4: Min assist Dynamic Standing - Balance Activities: Reaching for objects;Forward lean/weight shifting Extremity/Trunk Assessment RUE Assessment RUE Assessment: Within Functional Limits RUE AROM (degrees) Overall AROM Right Upper Extremity: Within functional limits for tasks performed RUE Strength RUE Overall Strength: Deficits RUE Overall Strength Comments: grossly 4/5 Right Shoulder Flexion: 4/5 Right Elbow Flexion: 4+/5 Right Elbow Extension: 4+/5 LUE Assessment LUE Assessment: Exceptions to WFL LUE AROM (degrees) Overall AROM Left Upper Extremity: Deficits LUE Overall AROM Comments: grossly WFL except Shoulder to 120 degrees abd/flex LUE Strength LUE Overall Strength: Deficits Left Shoulder Flexion: 4-/5 Left Shoulder ABduction: 4-/5 Left Elbow Flexion: 4/5 Left Elbow Extension: 4/5   See Function Navigator for Current Functional Status.   Refer to Care Plan for Long Term Goals  Recommendations for other services: None  Discharge Criteria: Patient will be discharged from OT if patient refuses treatment 3 consecutive times without medical reason, if treatment goals not met, if there is a change in medical status, if patient makes no progress towards goals or if patient is discharged from hospital.  The above assessment, treatment plan, treatment alternatives and goals were discussed and mutually agreed upon: by patient  Lisa Roca 01/06/2015, 5:59 PM

## 2015-01-07 ENCOUNTER — Inpatient Hospital Stay (HOSPITAL_COMMUNITY): Payer: Medicaid Other

## 2015-01-07 ENCOUNTER — Inpatient Hospital Stay (HOSPITAL_COMMUNITY): Payer: Medicaid Other | Admitting: Occupational Therapy

## 2015-01-07 ENCOUNTER — Encounter (HOSPITAL_COMMUNITY): Payer: Self-pay | Admitting: Vascular Surgery

## 2015-01-07 DIAGNOSIS — G8194 Hemiplegia, unspecified affecting left nondominant side: Secondary | ICD-10-CM

## 2015-01-07 LAB — PROTIME-INR
INR: 1.94 — AB (ref 0.00–1.49)
PROTHROMBIN TIME: 22.1 s — AB (ref 11.6–15.2)

## 2015-01-07 MED ORDER — WARFARIN SODIUM 5 MG PO TABS
5.0000 mg | ORAL_TABLET | Freq: Once | ORAL | Status: AC
  Administered 2015-01-07: 5 mg via ORAL
  Filled 2015-01-07: qty 1

## 2015-01-07 NOTE — H&P (Addendum)
Physical Medicine and Rehabilitation Admission H&P   Chief complaint: Weakness  HPI: [name] is a 39 y.o. right hand female with history of fibromyalgia, SVT status post ablation, chronic anemia, hypertension, SLE with recently diagnosed lupus nephritis. Patient lives with her mother and 2 young children. Independent prior to admission. Presented 12/03/2014 with dysuria fever as well as nausea. Placed on broad-spectrum anti-biotic's. Creatinine elevated 8.65 followed by renal services. CT abdomen and pelvis showed a small chronic left retroperitoneal hematoma and chronic subcapsular hematoma. No findings to suggest recurrent hemorrhage. Underwent HD catheter placement per radiology services 12/06/2014 and dialysis initiated with permanent dialysis catheter 12/11/2014 per Dr. Trula Slade that required ligation of AV graft due to ischemic steal of left arm 12/19/2014 and later with left thigh graft placement for HD 01/04/2015 per Dr. Donnetta Hutching. On 12/21/2014 patient became verbally less responsive left facial droop right eye deviation. Cranial CT scan negative. Neurology service is consulted. MRI of the brain showed right MCA infarct. MRA of the head negative. CT angiogram of the neck showed no extracranial cerebrovascular disease. Echocardiogram with ejection fraction of 17% grade 1 diastolic dysfunction. TEE with systolic normal function. There was a mobile density off the lateral aspect of the posterior mitral valve leaflet consistent with vegetation/mitral valve endocarditis and placed on vancomycin for total 3 weeks ending 01/22/2015. Patient currently maintained on Coumadin. Wound care nurse consulted 12/21/2014 for sacral ulcer with dressing changes as advised. Hospital course further complicated by tachycardia and Pseudomonas UTI with Ceftazadime completed 01/03/2015. Physical and occupational therapy evaluations completed 01/01/2015 with recommendations of physical medicine rehabilitation consult.  Patient was admitted for a comprehensive rehabilitation program  ROS Constitutional: Negative for fever and chills.  Eyes: Negative for blurred vision and double vision.  Respiratory: Negative for cough.   Shortness of breath on exertion  Cardiovascular: Positive for leg swelling. Negative for chest pain.  Gastrointestinal: Negative for vomiting.   GERD  Genitourinary: Negative for dysuria.  Skin: Negative for rash. 2 areas of stage II pressure injuries. Foam dressing in place without odor Neurological: Positive for weakness. Negative for seizures and loss of consciousness   Past Medical History  Diagnosis Date  . Hypertension   . Cardiac arrhythmia   . SVT (supraventricular tachycardia)     "today, last week, 2 wk ago; maybe 1 month ago, etc; started w/in last 3-4 yrs"(07/20/2012)  . Fainting     "~ 1 month ago; probably related to SVT" (07/20/2012)  . Shortness of breath     "related to SVT episodes" (07/20/2012)  . Chest pain at rest     "related to SVT" (07/20/2012)  . Lupus (systemic lupus erythematosus)   . GERD (gastroesophageal reflux disease)   . Fibromyalgia   . Irritable bowel syndrome (IBS)   . Hypercholesterolemia   . Heart murmur     "small" (12/25/2014)  . TIA (transient ischemic attack) 12/21/2014  . Sleep apnea     "don't wear mask anymore" (12/25/2014)  . Anemia   . History of blood transfusion "several"    "related to low counts"  . Daily headache   . Arthritis     "hips" (12/25/2014)  . Anxiety     "sometimes; don't take anything for it" (12/25/2014)  . ESRD (end stage renal disease) on dialysis since 12/07/2014    "I've been doing it here on [facility]" (12/25/2014)  . Chronic renal failure, stage 4 (severe)   . Renal insufficiency    Past Surgical History  Procedure Laterality Date  .  Cesarean section  2001; 2010  . Cervical biopsy   2013  . Supraventricular tachycardia ablation  07/21/12     Dr. Cristopher Peru   . Peripheral vascular catheterization N/A 12/14/2014    Procedure: Upper Extremity Venography; Surgeon: Elam Dutch, MD; Location: [facility]; Service: Cardiovascular; Laterality: N/A;  . Insertion hybrid anteriovenous gortex graft Left 12/18/2014    Procedure: INSERTION GORE HYBRID ARTERIOVENOUS GRAFT LEFT AXILLO-BRACHIAL.; Surgeon: Serafina Mitchell, MD; Location: [facility] OR; Service: Vascular; Laterality: Left;  . Ligation of arteriovenous fistula Left 12/19/2014    Procedure: LIGATION OF FISTULA; Surgeon: Elam Dutch, MD; Location: [facility]; Service: Vascular; Laterality: Left;  . Appendectomy  2011  . Tubal ligation  2010  . Tee without cardioversion N/A 12/25/2014    Procedure: TRANSESOPHAGEAL ECHOCARDIOGRAM (TEE); Surgeon: Sueanne Margarita, MD; Location[facility] END[facility]; Service: Cardiovascular; Laterality: N/A;   Family History  Problem Relation Age of Onset  . Cancer Mother     breast and ovarian  . BRCA 1/2 Sister    Social History:  reports that she has never smoked. She has never used smokeless tobacco. She reports that she does not drink alcohol or use illicit drugs. Allergies:  Allergies  Allergen Reactions  . Food Swelling    Red peppers   Medications Prior to Admission  Medication Sig Dispense Refill  . acetaminophen (TYLENOL) 500 MG tablet Take 1,000 mg by mouth every 6 (six) hours as needed (pain).     Marland Kitchen alendronate (FOSAMAX) 70 MG tablet Take 70 mg by mouth once a week. Take on Wednesdays with a full glass of water on an empty stomach.    Marland Kitchen amLODipine (NORVASC) 10 MG tablet Take 1 tablet (10 mg total) by mouth daily. (Patient taking differently: Take 10 mg by mouth 2 (two) times daily. ) 30 tablet 2  . Calcium Carbonate Antacid (TUMS PO) Take 1-2 tablets by mouth daily as needed  (heartburn).    . cyclobenzaprine (FLEXERIL) 10 MG tablet Take 10 mg by mouth at bedtime as needed for muscle spasms.     . famotidine (PEPCID) 40 MG tablet Take 1 tablet (40 mg total) by mouth daily. 20 tablet 0  . mycophenolate (CELLCEPT) 500 MG tablet Take 1,500 mg by mouth 2 (two) times daily.     . ondansetron (ZOFRAN ODT) 4 MG disintegrating tablet 26m ODT q4 hours prn nausea/vomit 10 tablet 0  . oxyCODONE-acetaminophen (PERCOCET) 5-325 MG per tablet Take 1 tablet by mouth every 4 (four) hours as needed for severe pain. 20 tablet 0  . predniSONE (DELTASONE) 20 MG tablet Take 20 mg by mouth 3 (three) times daily.     . simvastatin (ZOCOR) 40 MG tablet Take 40 mg by mouth daily.    . [DISCONTINUED] metoprolol succinate (TOPROL-XL) 25 MG 24 hr tablet Take 25 mg by mouth daily.      Home: Home Living Family/patient expects to be discharged to:: Private residence Living Arrangements: Children, Parent (138yo son; 641yo daughter) Type of Home: Apartment Home Access: Stairs to enter ETechnical brewerof Steps: 1 (curb in parking lot) EChiropractor None Home Layout: One level Bathroom Shower/Tub: TChiropodist Standard Bathroom Accessibility: Yes Home Equipment: BEngineer, civil (consulting) WEnvironmental consultant- standard Additional Comments: walker was her mothers  Functional History: Prior Function Level of Independence: Independent  Functional Status:  Mobility: Bed Mobility Overal bed mobility: Needs Assistance Bed Mobility: Supine to Sit, Rolling Rolling: Supervision Supine to sit: HOB elevated, Supervision Sit  to supine: Min assist General bed mobility comments: Incr time and use of rail Transfers Overall transfer level: Needs assistance Equipment used: Rolling walker (2 wheeled) Transfers: Sit to/from Stand Sit to Stand: +2 physical assistance, Min assist Lateral/Scoot Transfers: Min assist General transfer comment:  Assist to bring hips up. Ambulation/Gait Ambulation/Gait assistance: +2 physical assistance, Min assist Ambulation Distance (Feet): 6 Feet Assistive device: Rolling walker (2 wheeled) Gait Pattern/deviations: Step-through pattern, Decreased step length - right, Decreased step length - left General Gait Details: Assist for balance and stability Gait velocity: decr Gait velocity interpretation: Below normal speed for age/gender    ADL: ADL Overall ADL's : Needs assistance/impaired Eating/Feeding: Modified independent, Bed level Grooming: Wash/dry hands, Wash/dry face, Set up, Sitting Upper Body Bathing: Set up, Sitting Lower Body Bathing: Moderate assistance, Sitting/lateral leans Upper Body Dressing : Set up, Sitting Toilet Transfer: +2 for physical assistance, Minimal assistance, RW Toileting- Clothing Manipulation and Hygiene: Total assistance Functional mobility during ADLs: Minimal assistance, +2 for safety/equipment General ADL Comments: Limited eval secondary to pt still with femoral HD cathetor and on bedrest. Pt issued red medium resistance theraputty and exercise handout for strengthening. Reviewed exercises and pt is able to complete them with supervision.   Cognition: Cognition Overall Cognitive Status: Within Functional Limits for tasks assessed Orientation Level: Oriented X4 Cognition Arousal/Alertness: Awake/alert Behavior During Therapy: WFL for tasks assessed/performed Overall Cognitive Status: Within Functional Limits for tasks assessed Memory: (Memory Edgar for gross testing. Will continue to monitor in treatment.)  Physical Exam: Blood pressure 133/70, pulse 99, temperature 99.5 F (37.5 C), temperature source Oral, resp. rate 18, height 5' 3"  (1.6 m), weight 100 kg (220 lb 7.4 oz), last menstrual period 11/17/2014, SpO2 98 %. Physical Exam Constitutional: She is oriented to person, place, and time. She appears well-developed and well-nourished.  Obese   HENT:  Head: Normocephalic and atraumatic.  Mild left facial droop  Eyes: Conjunctivae and EOM are normal.  Neck: Normal range of motion. Neck supple. No thyromegaly present.  Cardiovascular: Normal rate.  No murmur heard. Cardiac rate controlled  Respiratory: Effort normal and breath sounds normal. No respiratory distress.  GI: Soft. Bowel sounds are normal. She exhibits no distension.  Musculoskeletal:  RUE 4+/5 grossly LUE 4-/5 grossly  RLE proximal 4-/5, distally 4/5 grossly LLE proximal 3-/5, distally 4/5 grossly  Neurological: She is alert and oriented to person, place, and time. No cranial nerve deficit.  +Homan's LUE Sensation to light touch intact  Skin: Skin is warm and dry.  Sacral ulcer not examined  Psychiatric: She has a normal mood and affect. Her behavior is normal    Lab Results Last 48 Hours    Results for orders placed or performed during the hospital encounter of 12/03/14 (from the past 48 hour(s))  Renal function panel Status: Abnormal   Collection Time: 01/02/15 6:05 AM  Result Value Ref Range   Sodium 137 135 - 145 mmol/L   Potassium 4.0 3.5 - 5.1 mmol/L   Chloride 100 (L) 101 - 111 mmol/L   CO2 27 22 - 32 mmol/L   Glucose, Bld 78 65 - 99 mg/dL   BUN 39 (H) 6 - 20 mg/dL   Creatinine, Ser 3.43 (H) 0.44 - 1.00 mg/dL   Calcium 7.9 (L) 8.9 - 10.3 mg/dL   Phosphorus 3.4 2.5 - 4.6 mg/dL   Albumin 2.1 (L) 3.5 - 5.0 g/dL   GFR calc non Af Amer 16 (L) >60 mL/min   GFR calc Af Amer 18 (L) >  60 mL/min    Comment: (NOTE) The eGFR has been calculated using the CKD EPI equation. This calculation has not been validated in all clinical situations. eGFR's persistently <60 mL/min signify possible Chronic Kidney Disease.    Anion gap 10 5 - 15  Protime-INR Status: Abnormal   Collection Time: 01/02/15 6:05 AM  Result Value Ref Range   Prothrombin Time 18.9 (H) 11.6 -  15.2 seconds   INR 1.58 (H) 0.00 - 1.49  CBC with Differential Status: Abnormal   Collection Time: 01/02/15 6:05 AM  Result Value Ref Range   WBC 2.0 (L) 4.0 - 10.5 K/uL   RBC 2.93 (L) 3.87 - 5.11 MIL/uL   Hemoglobin 8.6 (L) 12.0 - 15.0 g/dL   HCT 27.5 (L) 36.0 - 46.0 %   MCV 93.9 78.0 - 100.0 fL   MCH 29.4 26.0 - 34.0 pg   MCHC 31.3 30.0 - 36.0 g/dL   RDW 15.8 (H) 11.5 - 15.5 %   Platelets 159 150 - 400 K/uL   Neutrophils Relative % 65 %   Lymphocytes Relative 21 %   Monocytes Relative 12 %   Eosinophils Relative 1 %   Basophils Relative 1 %   Neutro Abs 1.4 (L) 1.7 - 7.7 K/uL   Lymphs Abs 0.4 (L) 0.7 - 4.0 K/uL   Monocytes Absolute 0.2 0.1 - 1.0 K/uL   Eosinophils Absolute 0.0 0.0 - 0.7 K/uL   Basophils Absolute 0.0 0.0 - 0.1 K/uL   WBC Morphology MILD LEFT SHIFT (1-5% METAS, OCC MYELO, OCC BANDS)   Heparin level (unfractionated) Status: Abnormal   Collection Time: 01/02/15 10:14 PM  Result Value Ref Range   Heparin Unfractionated 0.25 (L) 0.30 - 0.70 IU/mL    Comment:   IF HEPARIN RESULTS ARE BELOW EXPECTED VALUES, AND PATIENT DOSAGE HAS BEEN CONFIRMED, SUGGEST FOLLOW UP TESTING OF ANTITHROMBIN III LEVELS.   Renal function panel Status: Abnormal   Collection Time: 01/03/15 5:00 AM  Result Value Ref Range   Sodium 135 135 - 145 mmol/L   Potassium 3.7 3.5 - 5.1 mmol/L   Chloride 99 (L) 101 - 111 mmol/L   CO2 25 22 - 32 mmol/L   Glucose, Bld 75 65 - 99 mg/dL   BUN 44 (H) 6 - 20 mg/dL   Creatinine, Ser 3.84 (H) 0.44 - 1.00 mg/dL   Calcium 8.7 (L) 8.9 - 10.3 mg/dL   Phosphorus 3.6 2.5 - 4.6 mg/dL   Albumin 2.2 (L) 3.5 - 5.0 g/dL   GFR calc non Af Amer 14 (L) >60 mL/min   GFR calc Af Amer 16 (L) >60 mL/min    Comment: (NOTE) The eGFR has been calculated using the CKD EPI equation. This  calculation has not been validated in all clinical situations. eGFR's persistently <60 mL/min signify possible Chronic Kidney Disease.    Anion gap 11 5 - 15  CBC Status: Abnormal   Collection Time: 01/03/15 5:00 AM  Result Value Ref Range   WBC 3.4 (L) 4.0 - 10.5 K/uL   RBC 2.97 (L) 3.87 - 5.11 MIL/uL   Hemoglobin 9.0 (L) 12.0 - 15.0 g/dL   HCT 27.8 (L) 36.0 - 46.0 %   MCV 93.6 78.0 - 100.0 fL   MCH 30.3 26.0 - 34.0 pg   MCHC 32.4 30.0 - 36.0 g/dL   RDW 15.8 (H) 11.5 - 15.5 %   Platelets 204 150 - 400 K/uL      Imaging Results (Last 48 hours)    No  results found.       Medical Problem List and Plan: 1. Functional deficits secondary to right MCA infarct with history of lupus/multi-medical, has left  LE>UE weakness 2. DVT Prophylaxis/Anticoagulation: Coumadin per pharmacy protocol and no bridging required. Monitor for any bleeding episodes 3. Pain Management: Oxycodone and flexeril as needed. Monitor with increased mobility 4. Lupus nephritis with end-stage renal disease. Continue hemodialysis as advised. Permanent access graft placement 01/04/2015. 5. Neuropsych: This patient is capable of making decisions on her own behalf. 6. Skin/Wound Care: Routine skin care 7. Fluids/Electrolytes/Nutrition: Strict I&O with follow-up chemistries 8. Endocarditis of mitral valve. Unable to rule out infectious endocarditis as patient is immunosuppressed and leukopenic. Blood cultures negative. Plan vancomycin for 3 more weeks until 01/22/2015. 9. Hypertension. Norvasc 5 mg daily, Imdur or 30 mg daily, Toprol-XL 25 mg daily at bedtime. Monitor with increased mobility 10. Pseudomonas urinary tract infection. Ceftazadime completed 01/03/2015 11. Morbid obesity. Dietary follow-up 12. Sacral decubitus. Foam dressing change every 3 days as needed pressure release 13. Hyperlipidemia. Lipitor    Post Admission Physician Evaluation: Functional  deficits secondary to  right MCA infarct with history of lupus/multi-medical 1. . 2. Patient is admitted to receive collaborative, interdisciplinary care between the physiatrist, rehab nursing staff, and therapy team. 3. Patient's level of medical complexity and substantial therapy needs in context of that medical necessity cannot be provided at a lesser intensity of care such as a SNF. 4. Patient has experienced substantial functional loss from his/her baseline which was documented above under the "Functional History" and "Functional Status" headings. Judging by the patient's diagnosis, physical exam, and functional history, the patient has potential for functional progress which will result in measurable gains while on inpatient rehab. These gains will be of substantial and practical use upon discharge in facilitating mobility and self-care at the household level. 5. Physiatrist will provide 24 hour management of medical needs as well as oversight of the therapy plan/treatment and provide guidance as appropriate regarding the interaction of the two. 6. 24 hour rehab nursing will assist with bladder management, bowel management, safety, skin/wound care, disease management, medication administration, pain management and patient education and help integrate therapy concepts, techniques,education, etc. 7. PT will assess and treat for/with: pre gait, gait training, endurance , safety, equipment, neuromuscular re education. Goals are: Mod I. 8. OT will assess and treat for/with: ADLs, Cognitive perceptual skills, Neuromuscular re education, safety, endurance, equipment. Goals are: MoD I. Therapy may proceed with showering this patient. 9. SLP will assess and treat for/with: NA. Goals are: NA. 10. Case Management and Social Worker will assess and treat for psychological issues and discharge planning. 11. Team conference will be held weekly to assess progress toward goals and to determine barriers to  discharge. 12. Patient will receive at least 3 hours of therapy per day at least 5 days per week. 13. ELOS: 10-13d  14. Prognosis: excellent     Charlett Blake M.D. [facility] FAAPM&R (Sports Med, Neuromuscular Med) Diplomate Am Board of Electrodiagnostic Med  01/03/2015

## 2015-01-07 NOTE — IPOC Note (Signed)
Overall Plan of Care [facility]) Patient Details Name: Rika Daughdrill MRN: [id number] DOB: 09-09-1975  Admitting Diagnosis: RIGHT CVA  Hospital Problems: Active Problems:   Right middle cerebral artery stroke (HCC)   Left hemiparesis ([place])     Functional Problem List: Nursing Bowel, Endurance, Medication Management, Nutrition, Pain, Safety, Skin Integrity  PT Balance, Endurance, Motor, Pain, Safety, Skin Integrity  OT Balance, Endurance, Motor  SLP    TR         Basic ADL's: OT Grooming, Bathing, Dressing, Toileting     Advanced  ADL's: OT Simple Meal Preparation, Laundry, Light Housekeeping     Transfers: PT Bed Mobility, Bed to Chair, Furniture, Floor, Teacher, early years/pre, Metallurgist: PT Ambulation, Emergency planning/management officer, Stairs     Additional Impairments: OT Other (comment) (decreased LUE strength)  SLP        TR      Anticipated Outcomes Item Anticipated Outcome  Self Feeding Independent  Swallowing      Basic self-care  mod I  Toileting  mod I   Bathroom Transfers mod I toilet SBA tub  Bowel/Bladder  Patient manages bowel/bladder with min assiist  Transfers  Mod I  Locomotion  Mod I  Communication     Cognition     Pain  less than 3  Safety/Judgment  Pt remain free of injury/falls while on rehab   Therapy Plan: PT Intensity: Minimum of 1-2 x/day ,45 to 90 minutes PT Frequency: Total of 15 hours over 7 days of combined therapies PT Duration Estimated Length of Stay: 10-14 days OT Intensity: Minimum of 1-2 x/day, 45 to 90 minutes OT Frequency: 5 out of 7 days OT Duration/Estimated Length of Stay: 10-14 days         Team Interventions: Nursing Interventions Patient/Family Education, Bowel Management, Pain Management, Medication Management, Skin Care/Wound Management, Discharge Planning, Psychosocial Support  PT interventions DME/adaptive equipment instruction, UE/LE Strength taining/ROM, Ambulation/gait training, Balance/vestibular  training, Functional electrical stimulation, UE/LE Coordination activities, Skin care/wound management, Psychosocial support, Functional mobility training, Community reintegration, Neuromuscular re-education, IT trainer, Wheelchair propulsion/positioning, Therapeutic Activities, Disease management/prevention, Patient/family education, Therapeutic Exercise, Pain management, Discharge planning, Splinting/orthotics, Visual/perceptual remediation/compensation  OT Interventions Training and development officer, Discharge planning, DME/adaptive equipment instruction, Functional mobility training, Patient/family education, Self Care/advanced ADL retraining, Therapeutic Activities, Therapeutic Exercise, UE/LE Strength taining/ROM  SLP Interventions    TR Interventions    SW/CM Interventions Discharge Planning, Psychosocial Support, Patient/Family Education    Team Discharge Planning: Destination: PT-Home ,OT- Home , SLP-  Projected Follow-up: PT-Home health PT, OT-  Home health OT, SLP-  Projected Equipment Needs: PT-To be determined, OT- Tub/shower bench, SLP-  Equipment Details: PT- , OT-  Patient/family involved in discharge planning: PT- Patient,  OT-Patient, SLP-   MD ELOS: 10-13d Medical Rehab Prognosis:  Excellent Assessment: 39 y.o. right hand female with history of fibromyalgia, SVT status post ablation, chronic anemia, hypertension, SLE with recently diagnosed lupus nephritis. Patient lives with her mother and 2 young children. Independent prior to admission. Presented 12/03/2014 with dysuria fever as well as nausea. Placed on broad-spectrum anti-biotic's. Creatinine elevated 8.65 followed by renal services. CT abdomen and pelvis showed a small chronic left retroperitoneal hematoma and chronic subcapsular hematoma. No findings to suggest recurrent hemorrhage. Underwent HD catheter placement per radiology services 12/06/2014 and dialysis initiated with permanent dialysis catheter 12/11/2014 per Dr.  Trula Slade that required ligation of AV graft due to ischemic steal of left arm 12/19/2014 and later with left thigh graft placement  for HD 01/04/2015 per Dr. Donnetta Hutching. On 12/21/2014 patient became verbally less responsive left facial droop right eye deviation. Cranial CT scan negative. Neurology service is consulted. MRI of the brain showed right MCA infarct    See Team Conference Notes for weekly updates to the plan of care

## 2015-01-07 NOTE — Progress Notes (Signed)
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 Food Swelling    Red peppers    Patient Measurements: Height: 5\' 3"  (160 cm) Weight: 248 lb (112.492 kg) IBW/kg (Calculated) : 52.4   Vital Signs: Temp: 98.9 F (37.2 C) (10/10 0502) Temp Source: Oral (10/10 0502) BP: 118/63 mmHg (10/10 0502) Pulse Rate: 105 (10/10 0502)  Labs:  Recent Labs  01/05/15 0405 01/05/15 2110 01/06/15 0608 01/07/15 0518  HGB 7.6* 8.1*  --   --   HCT 24.4* 25.5*  --   --   PLT 236 226  --   --   LABPROT 19.2*  --  20.7* 22.1*  INR 1.62*  --  1.78* 1.94*  CREATININE 4.69* 3.02*  --   --     Estimated Creatinine Clearance: 30.5 mL/min (by C-G formula based on Cr of 3.02).   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 No bleeding noted per chart.   Goal of Therapy:  Monitor platelets by anticoagulation protocol: Yes   Plan:  -warfarin 5 mg po x1 tonight -Daily INR, CBC q72h   Maryanna Shape, PharmD, BCPS  Clinical Pharmacist  Pager: [phone]   01/07/2015 12:36 PM

## 2015-01-07 NOTE — Progress Notes (Signed)
Social Work Social Work Assessment and Plan  Patient Details  Name: [name] MRN: [id number] Date of Birth: February 08, 1976  Today's Date: 01/07/2015  Problem List:  Patient Active Problem List   Diagnosis Date Noted  . Left hemiparesis ([place])   . 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 Anemia due to bone marrow failure ([place])   . 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 Other pancytopenia ([place])   . Pressure ulcer 12/21/2014  . Steal syndrome as complication of dialysis access ([place])   . ESRD (end stage renal disease) on dialysis ([place])   . UTI (lower urinary tract infection)   . Retroperitoneal hematoma   . Lupus nephritis ([place])   . Hypertension 07/20/2012  . Weight loss, unintentional 07/20/2012   Past Medical History:  Past Medical History  Diagnosis Date  . Hypertension   . Cardiac arrhythmia   . SVT (supraventricular tachycardia) ([place])     "today, last week, 2 wk ago; maybe 1 month ago, etc; started w/in last 3-4 yrs"(07/20/2012)  . Fainting     "~ 1 month ago; probably related to SVT" (07/20/2012)  . Shortness of breath     "related to SVT episodes" (07/20/2012)  . Chest pain at rest     "related to SVT" (07/20/2012)  . Lupus (systemic lupus erythematosus) ([place])   . GERD (gastroesophageal reflux disease)   . Fibromyalgia   . Irritable bowel syndrome (IBS)   . Hypercholesterolemia   . Heart murmur     "small" (12/25/2014)  . TIA (transient ischemic attack) 12/21/2014  . Sleep apnea     "don't wear mask anymore" (12/25/2014)  . Anemia   . History of blood transfusion "several"    "related to low counts"  . Daily headache   . Arthritis     "hips" (12/25/2014)  . Anxiety     "sometimes; don't take anything for it" (12/25/2014)  . ESRD (end stage renal disease) on dialysis ([place]) since 12/07/2014    "I've been doing it here on [facility]" (12/25/2014)  . Chronic renal failure, stage 4 (severe) ([facility])   . Renal insufficiency    Past Surgical History:  Past  Surgical History  Procedure Laterality Date  . Cesarean section  2001; 2010  . Cervical biopsy  2013  . Supraventricular tachycardia ablation  07/21/12     Dr. Cristopher Peru   . Peripheral vascular catheterization N/A 12/14/2014    Procedure: Upper Extremity Venography;  Surgeon: Elam Dutch, MD;  Location: [facility];  Service: Cardiovascular;  Laterality: N/A;  . Insertion hybrid anteriovenous gortex graft Left 12/18/2014    Procedure: INSERTION GORE HYBRID ARTERIOVENOUS GRAFT LEFT AXILLO-BRACHIAL.;  Surgeon: Serafina Mitchell, MD;  Location: [facility];  Service: Vascular;  Laterality: Left;  . Ligation of arteriovenous  fistula Left 12/19/2014    Procedure: LIGATION OF FISTULA;  Surgeon: Elam Dutch, MD;  Location: [facility];  Service: Vascular;  Laterality: Left;  . Appendectomy  2011  . Tubal ligation  2010  . Tee without cardioversion N/A 12/25/2014    Procedure: TRANSESOPHAGEAL ECHOCARDIOGRAM (TEE);  Surgeon: Sueanne Margarita, MD;  Location: [facility];  Service: Cardiovascular;  Laterality: N/A;  . Av fistula placement Left 01/04/2015    Procedure: INSERTION OF ARTERIOVENOUS (AV) GORE-TEX GRAFT THIGH;  Surgeon: Rosetta Posner, MD;  Location: [facility];  Service: Vascular;  Laterality: Left;   Social History:  reports that she has never smoked. She has never used smokeless tobacco.  She reports that she does not drink alcohol or use illicit drugs.  Family / Support Systems Marital Status: Single Patient Roles: Parent, Other (Comment) (employee) Children: has 59 yo son and 94 yo daughter Other Supports: Denise Hilton-Mom [phone]  [phone] Anticipated Caregiver: Mom Ability/Limitations of Caregiver: No limitations Mom is in good health and can assist Caregiver Availability: 24/7 Family Dynamics: Close with Mom anf two sister's how are all very supportive of pt, along with her chidlren.  Her children are her life and she is hopeful she will be home soon, she has been in the  hospital a long time.  She feels she is on the up swing and can see the light at the end of the tunnel.  Social History Preferred language: English Religion: Baptist Cultural Background: No issues Education: High School Read: Yes Write: Yes Employment Status: Employed Date Retired/Disabled/Unemployed: 12/21/2014 has applied for SSD Name of Employer: [employer] Return to Work Plans: Doesn't feel she can return has applied for disability due to HD pt now Legal Hisotry/Current Legal Issues: No issues Guardian/Conservator: None-according to MD pt is capable of making her own decisions while here.  Mom is usually here with her.   Abuse/Neglect Physical Abuse: Denies Verbal Abuse: Denies Sexual Abuse: Denies Exploitation of patient/patient's resources: Denies Self-Neglect: Denies  Emotional Status Pt's affect, behavior adn adjustment status: Pt is motivated to go home sooner than the team has targeted. She wants to get back to her children and this is her motivating force to push herself in therapies and achieve her goals sooner than expected. Mom is very supportive and is her Barrister's clerk. She has always been independent and wants to remain so. Recent Psychosocial Issues: Other health issues-her lupus has affected her organs and this is the reason for her hospitalization Pyschiatric History: has anxiety this hospitalization which she had not noticed before. She feels much better now on rehab and can see the light at the end of the tunnel, being home with her children. Would benefit from neuro-psych seeing while here to assist with anxiety reducing techniques. Substance Abuse History: No issues  Patient / Family Perceptions, Expectations & Goals Pt/Family understanding of illness & functional limitations: Pt is able to explain her diagnoses and treatment since coming to the hospital. She has been through a lot and is glad to be doing and feeling better. She and her Mom talk with the MD's  and feel they have a good understanding of her treamtent plan form here on out. Premorbid pt/family roles/activities: Mom, employee, sister, daughter, dialysis patient, etc Anticipated changes in roles/activities/participation: does not plan to return to work upon discharge Pt/family expectations/goals: Pt states: " I hope to go home sooner than the team thinks, my timeline is less than ten days here."  Mom states: " I am so glad how well she is doing and can see home a reality now."  [employer]: Other (Comment) (New HD- T, Th & Sat-[address]) Premorbid Home Care/DME Agencies: None Transportation available at discharge: Mom and took the bus, now will apply for [employer] referrals recommended: Neuropsychology, Support group (specify)  Discharge Planning Living Arrangements: Children, Parent Support Systems: Children, Armed forces technical officer, Other relatives, Friends/neighbors, Social worker community Type of Residence: Private residence Insurance Resources: Medicaid (specify county) ([place]) Pensions consultant: Employment, Family Support, Other (Comment) (SSD pending) Financial Screen Referred: Previously completed Living Expenses: Education officer, community Management: Patient, Family Does the patient have any problems obtaining your medications?: No Home Management: Mom and pt before  got sick Patient/Family Preliminary Plans: Return home with Mom and her children, Mom doesn't work and can assist with her care if needed. Pt's goals are mod/i so she should be able to take care of herself, but may need assist with her children. Mom is very supportive and willing to do whatever pt needs. Social Work Anticipated Follow Up Needs: HH/OP, Support Group  Clinical Impression Pleasant quiet female who is tired from therapies, but feels good about her progress she is making. Her goal is to be discharged before ten days which the team feels it will take to reach her goals here. Her mom and sister's  are very supportive and will assist if needed. Pt wants to get home to her children and her life. She has had to deal with a lot with all of her health issues. She seems to be as well As can be expected, but would benefit from neuro-psych seeing while here. Will work toward a discharge plan.  [name] 01/07/2015, 1:40 PM

## 2015-01-07 NOTE — Progress Notes (Signed)
Physical Therapy Session Note  Patient Details  Name: [name] MRN: [id number] Date of Birth: 1975/11/12  Today's Date: 01/07/2015 PT Individual Time: 5400-8676; 1420-1530 PT Individual Time Calculation (min): 54 min and 70 min  Short Term Goals: Week 1:  PT Short Term Goal 1 (Week 1): Pt will be Min guard for transfers PT Short Term Goal 2 (Week 1): Pt will be SBA for all bed mobility PT Short Term Goal 3 (Week 1): Pt will be Min Guard for gait 150' PT Short Term Goal 4 (Week 1): Pt will perform stairs 12 with Min A  Skilled Therapeutic Interventions/Progress Updates:   Session 1:  Patient seen with mother present.  Educated in [place] of care as well as in goals and level of support from family expected during inpatient stay.  Patient in wheelchair after legrest adjusted propelled 79' with min/mod assist and mod cues with tendency to veer to left, cues for technique and safety maneuvering around obstacles.  Patient sit to stand min assist and ambulated with rollator about 69' then 19' with min assist, cues for walker safety with transfers and turns.  Patient ambulated with two wheeled walker 70' min assist and cues/demo for safety with turns.  Patient negotiated 4 steps with two rails and mod assist, cues for sequence/technique.  Patient performed standing therex for hip abduction with UE support.  Denied hip pain, but does report fatigue.  Propelled wheelchair another 100' min/mod assist and set pt in room with mother present and call bell in reach.    Session 2: Patient lying supine in bed, cues for technique for up from sidelying with min assist.  Patient sit to stand min assist and pivot to wheelchair minguard.  Propelled wheelchair 120' min/mod assist and mod cues for technique due to veers left with decreased left UE strength.  Ambulated with SPC min assist and holding wall rail on left x 60' after demo sequence and cues and manual assist for sequence.  Performed Berg balance  assessment, scored 25/56.  Educated on fall risk and need for walker full time for fall prevention at this time.  Patient ambulated with rollator x 70' min assist, negotiated ramp and curb with rollator and min/mod assist with cues for technique.  Patient sit<>supine on bed in ADL apt. Mod assist for legs in bed to supine and min assist lifting trunk to sit.  Patient reports usually lies across bed at home and has fallen out of bed before.  Ambulated back to room with rollator min assist x 80' to supine mod assist for LE's.  Patient reqiuring rest breaks between activities due to fatigue and reports nausea at end of session with RN aware.  Left in supine all needs in reach.  Therapy Documentation Precautions:  Precautions Precautions: Fall Precaution Comments: Pt's femoral cath is tunneled therefore she is able to get OOB. Restrictions Weight Bearing Restrictions: No Pain: Pain Assessment Pain Assessment: 0-10 Pain Score: 5  Pain Type: Acute pain Pain Location: Leg Pain Orientation: Left Pain Descriptors / Indicators: Tender;Sore Pain Intervention(s): Rest   See Function Navigator for Current Functional Status.   Therapy/Group: Individual Therapy  [place], [place] 01/07/2015  01/07/2015, 11:29 AM

## 2015-01-07 NOTE — Progress Notes (Signed)
Patient ID: [name], female   DOB: 1975-05-28, 39 y.o.   MRN: [id number]  [facility] Progress Note    Subjective:   Feels well, "getting stronger every day"   Objective:   BP 118/63 mmHg  Pulse 105  Temp(Src) 98.9 F (37.2 C) (Oral)  Resp 18  Ht 5\' 3"  (1.6 m)  Wt 112.492 kg (248 lb)  BMI 43.94 kg/m2  SpO2 98%  Intake/Output: I/O last 3 completed shifts: In: 240 [P.O.:240] Out: -    Intake/Output this shift:    Weight change: -1.814 kg (-4 lb)  Physical Exam: Gen:WD [name] in NAD CVS:no rub, tachycardic Resp:cta TKZ:SWFUXN Ext:no edema, left thigh AVG +T/B  Labs: BMET  Recent Labs Lab 01/01/15 0450 01/02/15 0605 01/03/15 0500 01/04/15 0104 01/05/15 0405 01/05/15 2110  NA 141 137 135 137 137 134*  K 4.2 4.0 3.7 4.1 4.8 3.4*  CL 109 100* 99* 101 101 96*  CO2 25 27 25 26 26 29   GLUCOSE 85 78 75 78 105* 94  BUN 76* 39* 44* 25* 38* 15  CREATININE 4.50* 3.43* 3.84* 3.20* 4.69* 3.02*  ALBUMIN 2.3* 2.1* 2.2*  --  2.1* 2.1*  CALCIUM 8.6* 7.9* 8.7* 8.7* 8.6* 7.7*  PHOS 3.4 3.4 3.6  --  4.8* 2.5   CBC  Recent Labs Lab 01/01/15 0450 01/02/15 0605 01/03/15 0500 01/04/15 0104 01/05/15 0405 01/05/15 2110  WBC 1.9* 2.0* 3.4* 4.9 6.0 6.4  NEUTROABS 1.3* 1.4*  --   --   --  5.9  HGB 8.5* 8.6* 9.0* 8.7* 7.6* 8.1*  HCT 26.7* 27.5* 27.8* 27.7* 24.4* 25.5*  MCV 93.7 93.9 93.6 94.2 94.6 93.4  PLT 159 159 204 204 236 226    @IMGRELPRIORS @ Medications:    . aspirin EC  81 mg Oral Daily  . atorvastatin  20 mg Oral q1800  . bisacodyl  5 mg Oral BID  . calcium acetate  1,334 mg Oral TID WC  . cyclobenzaprine  10 mg Oral QHS  . [START ON 01/10/2015] darbepoetin (ARANESP) injection - DIALYSIS  200 mcg Intravenous Q Thu-HD  . feeding supplement (PRO-STAT SUGAR FREE 64)  30 mL Oral Daily  . heparin  40 Units/kg Dialysis Once in dialysis  . hydroxychloroquine  400 mg Oral Daily  . isosorbide mononitrate  30 mg Oral Daily  . metoprolol  succinate  25 mg Oral QHS  . multivitamin  1 tablet Oral QHS  . pantoprazole  20 mg Oral Daily  . [START ON 01/08/2015] vancomycin  1,250 mg Intravenous Q T,Th,Sa-HD  . Warfarin - Pharmacist Dosing Inpatient   Does not apply [id number]    Assessment/ Plan:   1. CVA- doing well in rehab 2. ESRD- continue with HD qTTS (first HD session 12/07/14) 3. Anemia: on ESA 4. CKD-MBD: continue with binders 5. Nutrition:per primary svc 6. Vascular access- left femoral AVG placed 01/04/15 by Dr. Donnetta Hutching as well as right femoral tunneled HD cath 7. Hypertension:stable off amlodipine 8. Endocarditis- presumably Liebman-Sachs 9. SLE- biopsy on 8/25. This biopsy was complicated by a hematoma and it revealed again diffuse proliferative changes with crescents indicating that it could respond to immune suppressing therapy. She under went IV solumedrol, then high dose prednisone and was also on max dose cellcept.  These agents have been weaned off as she has progressed to [place]. 10. SVT- on beta blocker  Maelie Chriswell A 01/07/2015, 11:59 AM

## 2015-01-07 NOTE — Progress Notes (Signed)
Ankit Lorie Phenix, MD Physician Signed Physical Medicine and Rehabilitation Consult Note 01/02/2015 8:18 AM  Related encounter: ED to Hosp-Admission (Discharged) from 12/03/2014 in [place] Collapse All        Physical Medicine and Rehabilitation Consult Reason for Consult: Right MCA infarct with history of lupus Referring Physician: Internal medicine   HPI: [name] is a 39 y.o. right hand female with history of fibromyalgia, SVT status post ablation, chronic anemia, hypertension, SLE with recently diagnosed lupus nephritis. Patient lives with her mother and 2 young children. Independent prior to admission. Presented 12/03/2014 with dysuria fever as well as nausea. Placed on broad-spectrum anti-biotic's. Creatinine elevated 8.65 followed by renal services. CT abdomen and pelvis showed a small chronic left retroperitoneal hematoma and chronic subcapsular hematoma. No findings to suggest recurrent hemorrhage. Underwent HD catheter placement per radiology services 12/06/2014 and dialysis initiated with permanent dialysis catheter 12/11/2014 per Dr. Trula Slade that required ligation of AV graft due to ischemic steal of left arm 12/19/2014. On 12/21/2014 patient became verbally less responsive left facial droop right eye deviation. Cranial CT scan negative. Neurology service is consulted. MRI of the brain showed right MCA infarct. MRA of the head negative. CT angiogram of the neck showed no extracranial cerebrovascular disease. Echocardiogram with ejection fraction of 58% grade 1 diastolic dysfunction. TEE with systolic normal function. There was a mobile density off the lateral aspect of the posterior mitral valve leaflet consistent with vegetation. Patient currently maintained on Coumadin as well as broad-spectrum anti-biotics. Wound care nurse consulted 12/21/2014 for sacral ulcer with dressing changes as advised. Hospital course further complicated by  tachycardia and UTI. Physical therapy evaluation completed 01/01/2015 with recommendations of physical medicine rehabilitation consult.   Review of Systems  Constitutional: Negative for fever and chills.  Eyes: Negative for blurred vision and double vision.  Respiratory: Negative for cough.   Shortness of breath on exertion  Cardiovascular: Positive for leg swelling. Negative for chest pain.  Gastrointestinal: Negative for vomiting.   GERD  Genitourinary: Negative for dysuria.  Skin: Negative for rash.  Neurological: Positive for weakness. Negative for seizures and loss of consciousness.  All other systems reviewed and are negative.  Past Medical History  Diagnosis Date  . Hypertension   . Cardiac arrhythmia   . SVT (supraventricular tachycardia)     "today, last week, 2 wk ago; maybe 1 month ago, etc; started w/in last 3-4 yrs"(07/20/2012)  . Fainting     "~ 1 month ago; probably related to SVT" (07/20/2012)  . Shortness of breath     "related to SVT episodes" (07/20/2012)  . Chest pain at rest     "related to SVT" (07/20/2012)  . Lupus (systemic lupus erythematosus)   . GERD (gastroesophageal reflux disease)   . Fibromyalgia   . Irritable bowel syndrome (IBS)   . Hypercholesterolemia   . Heart murmur     "small" (12/25/2014)  . TIA (transient ischemic attack) 12/21/2014  . Sleep apnea     "don't wear mask anymore" (12/25/2014)  . Anemia   . History of blood transfusion "several"    "related to low counts"  . Daily headache   . Arthritis     "hips" (12/25/2014)  . Anxiety     "sometimes; don't take anything for it" (12/25/2014)  . ESRD (end stage renal disease) on dialysis since 12/07/2014    "I've been doing it here on [facility]" (12/25/2014)  . Chronic renal failure, stage  4 (severe)   . Renal insufficiency    Past Surgical History  Procedure Laterality Date  .  Cesarean section  2001; 2010  . Cervical biopsy  2013  . Supraventricular tachycardia ablation  07/21/12     Dr. Cristopher Peru   . Peripheral vascular catheterization N/A 12/14/2014    Procedure: Upper Extremity Venography; Surgeon: Elam Dutch, MD; Location: [facility]; Service: Cardiovascular; Laterality: N/A;  . Insertion hybrid anteriovenous gortex graft Left 12/18/2014    Procedure: INSERTION GORE HYBRID ARTERIOVENOUS GRAFT LEFT AXILLO-BRACHIAL.; Surgeon: Serafina Mitchell, MD; Location: [facility] OR; Service: Vascular; Laterality: Left;  . Ligation of arteriovenous fistula Left 12/19/2014    Procedure: LIGATION OF FISTULA; Surgeon: Elam Dutch, MD; Location: [facility]; Service: Vascular; Laterality: Left;  . Appendectomy  2011  . Tubal ligation  2010  . Tee without cardioversion N/A 12/25/2014    Procedure: TRANSESOPHAGEAL ECHOCARDIOGRAM (TEE); Surgeon: Sueanne Margarita, MD; Location: [facility]; Service: Cardiovascular; Laterality: N/A;   Family History  Problem Relation Age of Onset  . Cancer Mother     breast and ovarian  . BRCA 1/2 Sister    Social History:  reports that she has never smoked. She has never used smokeless tobacco. She reports that she does not drink alcohol or use illicit drugs. Allergies:  Allergies  Allergen Reactions  . Food Swelling    Red peppers   Medications Prior to Admission  Medication Sig Dispense Refill  . acetaminophen (TYLENOL) 500 MG tablet Take 1,000 mg by mouth every 6 (six) hours as needed (pain).     Marland Kitchen alendronate (FOSAMAX) 70 MG tablet Take 70 mg by mouth once a week. Take on Wednesdays with a full glass of water on an empty stomach.    Marland Kitchen amLODipine (NORVASC) 10 MG tablet Take 1 tablet (10 mg total) by mouth daily. (Patient taking differently: Take 10 mg by mouth 2 (two) times daily. ) 30 tablet 2  . Calcium Carbonate Antacid  (TUMS PO) Take 1-2 tablets by mouth daily as needed (heartburn).    . cyclobenzaprine (FLEXERIL) 10 MG tablet Take 10 mg by mouth at bedtime as needed for muscle spasms.     . famotidine (PEPCID) 40 MG tablet Take 1 tablet (40 mg total) by mouth daily. 20 tablet 0  . mycophenolate (CELLCEPT) 500 MG tablet Take 1,500 mg by mouth 2 (two) times daily.     . ondansetron (ZOFRAN ODT) 4 MG disintegrating tablet 59m ODT q4 hours prn nausea/vomit 10 tablet 0  . oxyCODONE-acetaminophen (PERCOCET) 5-325 MG per tablet Take 1 tablet by mouth every 4 (four) hours as needed for severe pain. 20 tablet 0  . predniSONE (DELTASONE) 20 MG tablet Take 20 mg by mouth 3 (three) times daily.     . simvastatin (ZOCOR) 40 MG tablet Take 40 mg by mouth daily.    . [DISCONTINUED] metoprolol succinate (TOPROL-XL) 25 MG 24 hr tablet Take 25 mg by mouth daily.      Home: Home Living Family/patient expects to be discharged to:: Private residence Living Arrangements: Children, Parent (156yo son; 655yo daughter) Type of Home: Apartment Home Access: Stairs to enter ETechnical brewerof Steps: 1 (curb in parking lot) Entrance Stairs-Rails: None Home Layout: One level Bathroom Shower/Tub: TChiropodist Standard Bathroom Accessibility: Yes Home Equipment: BEngineer, civil (consulting) WEnvironmental consultant- standard Additional Comments: walker was her mothers  Functional History: Prior Function Level of Independence: Independent Functional Status:  Mobility: Bed Mobility Overal bed  mobility: Needs Assistance Bed Mobility: Supine to Sit, Rolling Rolling: Supervision Supine to sit: HOB elevated, Supervision Sit to supine: Min assist General bed mobility comments: Incr time and use of rail Transfers Overall transfer level: Needs assistance Equipment used: Rolling walker (2 wheeled) Transfers: Sit to/from Stand Sit to Stand: +2 physical assistance, Min assist Lateral/Scoot  Transfers: Min assist General transfer comment: Assist to bring hips up. Ambulation/Gait Ambulation/Gait assistance: +2 physical assistance, Min assist Ambulation Distance (Feet): 6 Feet Assistive device: Rolling walker (2 wheeled) Gait Pattern/deviations: Step-through pattern, Decreased step length - right, Decreased step length - left General Gait Details: Assist for balance and stability Gait velocity: decr Gait velocity interpretation: Below normal speed for age/gender    ADL: ADL Overall ADL's : Needs assistance/impaired Eating/Feeding: Modified independent, Bed level Grooming: Wash/dry hands, Wash/dry face, Set up, Sitting Upper Body Bathing: Set up, Sitting Lower Body Bathing: Moderate assistance, Sitting/lateral leans Upper Body Dressing : Set up, Sitting Toilet Transfer: +2 for physical assistance, Minimal assistance, RW Toileting- Clothing Manipulation and Hygiene: Total assistance Functional mobility during ADLs: Minimal assistance, +2 for safety/equipment General ADL Comments: Limited eval secondary to pt still with femoral HD cathetor and on bedrest. Pt issued red medium resistance theraputty and exercise handout for strengthening. Reviewed exercises and pt is able to complete them with supervision.   Cognition: Cognition Overall Cognitive Status: Within Functional Limits for tasks assessed Orientation Level: Oriented X4 Cognition Arousal/Alertness: Awake/alert Behavior During Therapy: WFL for tasks assessed/performed Overall Cognitive Status: Within Functional Limits for tasks assessed Memory: (Memory Willow for gross testing. Will continue to monitor in treatment.)  Blood pressure 127/75, pulse 123, temperature 98.7 F (37.1 C), temperature source Oral, resp. rate 20, height 5' 3"  (1.6 m), weight 100 kg (220 lb 7.4 oz), last menstrual period 11/17/2014, SpO2 100 %. Physical Exam  Vitals reviewed. Constitutional: She is oriented to person, place, and time. She  appears well-developed and well-nourished.  Obese  HENT:  Head: Normocephalic and atraumatic.  Mild left facial droop  Eyes: Conjunctivae and EOM are normal.  Neck: Normal range of motion. Neck supple. No thyromegaly present.  Cardiovascular: Normal rate.  No murmur heard. Cardiac rate controlled  Respiratory: Effort normal and breath sounds normal. No respiratory distress.  GI: Soft. Bowel sounds are normal. She exhibits no distension.  Musculoskeletal:  RUE 4+/5 grossly LUE 4/5 grossly  RLE proximal 3-/5, distally 4/5 grossly LLE proximal 3-/5, distally 4/5 grossly  Neurological: She is alert and oriented to person, place, and time. No cranial nerve deficit.  +Homan's LUE Sensation to light touch intact  Skin: Skin is warm and dry.  Sacral ulcer not examined  Psychiatric: She has a normal mood and affect. Her behavior is normal.     Lab Results Last 24 Hours    Results for orders placed or performed during the hospital encounter of 12/03/14 (from the past 24 hour(s))  Renal function panel Status: Abnormal   Collection Time: 01/02/15 6:05 AM  Result Value Ref Range   Sodium 137 135 - 145 mmol/L   Potassium 4.0 3.5 - 5.1 mmol/L   Chloride 100 (L) 101 - 111 mmol/L   CO2 27 22 - 32 mmol/L   Glucose, Bld 78 65 - 99 mg/dL   BUN 39 (H) 6 - 20 mg/dL   Creatinine, Ser 3.43 (H) 0.44 - 1.00 mg/dL   Calcium 7.9 (L) 8.9 - 10.3 mg/dL   Phosphorus 3.4 2.5 - 4.6 mg/dL   Albumin 2.1 (L) 3.5 - 5.0  g/dL   GFR calc non Af Amer 16 (L) >60 mL/min   GFR calc Af Amer 18 (L) >60 mL/min   Anion gap 10 5 - 15  Protime-INR Status: Abnormal   Collection Time: 01/02/15 6:05 AM  Result Value Ref Range   Prothrombin Time 18.9 (H) 11.6 - 15.2 seconds   INR 1.58 (H) 0.00 - 1.49  CBC with Differential Status: Abnormal   Collection Time: 01/02/15 6:05 AM  Result Value Ref Range   WBC 2.0 (L) 4.0 -  10.5 K/uL   RBC 2.93 (L) 3.87 - 5.11 MIL/uL   Hemoglobin 8.6 (L) 12.0 - 15.0 g/dL   HCT 27.5 (L) 36.0 - 46.0 %   MCV 93.9 78.0 - 100.0 fL   MCH 29.4 26.0 - 34.0 pg   MCHC 31.3 30.0 - 36.0 g/dL   RDW 15.8 (H) 11.5 - 15.5 %   Platelets 159 150 - 400 K/uL   Neutrophils Relative % 65 %   Lymphocytes Relative 21 %   Monocytes Relative 12 %   Eosinophils Relative 1 %   Basophils Relative 1 %   Neutro Abs 1.4 (L) 1.7 - 7.7 K/uL   Lymphs Abs 0.4 (L) 0.7 - 4.0 K/uL   Monocytes Absolute 0.2 0.1 - 1.0 K/uL   Eosinophils Absolute 0.0 0.0 - 0.7 K/uL   Basophils Absolute 0.0 0.0 - 0.1 K/uL   WBC Morphology MILD LEFT SHIFT (1-5% METAS, OCC MYELO, OCC BANDS)       Imaging Results (Last 48 hours)    No results found.    Assessment/Plan: Diagnosis: Right MCA infarct with history of lupus 1. Does the need for close, 24 hr/day medical supervision in concert with the patient's rehab needs make it unreasonable for this patient to be served in a less intensive setting? Yes 2. Co-Morbidities requiring supervision/potential complications: HTN, tachycardia, Lupus, ESRD, decubitus ulcer, endocarditis, anemia, leukopenia, morbid obesity 3. Due to safety, skin/wound care, disease management, medication administration and patient education, does the patient require 24 hr/day rehab nursing? Yes 4. Does the patient require coordinated care of a physician, rehab nurse, PT (1.5-2 hrs/day, 5 days/week) and OT (1.5-2 hrs/day, 5 days/week) to address physical and functional deficits in the context of the above medical diagnosis(es)? Yes Addressing deficits in the following areas: balance, endurance, locomotion, strength, transferring, bathing, dressing, grooming, toileting and psychosocial support 5. Can the patient actively participate in an intensive therapy program of at least 3 hrs of therapy per day at least 5 days per week?  Potentially 6. The potential for patient to make measurable gains while on inpatient rehab is excellent and good 7. Anticipated functional outcomes upon discharge from inpatient rehab are supervision and min assist with PT, independent and modified independent with OT, n/a with SLP. 8. Estimated rehab length of stay to reach the above functional goals is: 13-17 days. 9. Does the patient have adequate social supports and living environment to accommodate these discharge functional goals? Potentially 10. Anticipated D/C setting: Home 11. Anticipated post D/C treatments: HH therapy and Home excercise program 12. Overall Rehab/Functional Prognosis: excellent and good  RECOMMENDATIONS: [name] condition is appropriate for continued rehabilitative care in the following setting: CIR Patient has agreed to participate in recommended program. Yes Note that insurance prior authorization may be required for reimbursement for recommended care.  Comment: Rehab Admissions Coordinator to follow up on support after discharge and stabilization of cardiac parameter.   01/02/2015       Revision History  Date/Time User Provider Type Action   01/02/2015 4:27 PM Ankit Lorie Phenix, MD Physician Sign   01/02/2015 8:41 AM Cathlyn Parsons, PA-C Physician Assistant Pend   View Details Report       Routing History     Date/Time From To Method   01/02/2015 4:27 PM Ankit Lorie Phenix, MD Ankit Lorie Phenix, MD In [facility]   01/02/2015 4:27 PM Ankit Lorie Phenix, MD Lin Landsman, MD Fax

## 2015-01-07 NOTE — Progress Notes (Signed)
Patient information reviewed and entered into eRehab system by Trinidad Ingle, RN, CRRN, PPS Coordinator.  Information including medical coding and functional independence measure will be reviewed and updated through discharge.    

## 2015-01-07 NOTE — Progress Notes (Signed)
Retta Diones, RN Rehab Admission Coordinator Signed Physical Medicine and Rehabilitation PMR Pre-admission 01/04/2015 12:31 PM  Related encounter: ED to Hosp-Admission (Discharged) from 12/03/2014 in [place] [facility] All Collapse All   PMR Admission Coordinator Pre-Admission Assessment  Patient: [name] is an 39 y.o., female MRN: [id number] DOB: 12-20-1975 Height: 5' 3"  (160 cm) Weight: 98 kg (216 lb 0.8 oz)  Insurance Information HMO:No PPO: PCP: IPA: 80/20: OTHER:  PRIMARY: Medicaid [place] access Policy#: [id number] T Subscriber: Bettye Boeck CM Name: Phone#: Fax#:  Pre-Cert#: Employer: Worked FT in Therapist, art Benefits: Phone #: [phone] Name: Automated Eff. 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 right hand female with history of fibromyalgia, SVT status post ablation, chronic anemia, hypertension, SLE with recently diagnosed lupus nephritis. Patient lives with her mother and 2 young children. Independent prior to admission. Presented 12/03/2014 with dysuria fever as well as nausea. Placed on broad-spectrum anti-biotic's. Creatinine elevated  8.65 followed by renal services. CT abdomen and pelvis showed a small chronic left retroperitoneal hematoma and chronic subcapsular hematoma. No findings to suggest recurrent hemorrhage. Underwent HD catheter placement per radiology services 12/06/2014 and dialysis initiated with permanent dialysis catheter 12/11/2014 per Dr. Trula Slade that required ligation of AV graft due to ischemic steal of left arm 12/19/2014 and later with left thigh graft placement for HD 01/04/2015 per Dr. Donnetta Hutching. On 12/21/2014 patient became verbally less responsive left facial droop right eye deviation. Cranial CT scan negative. Neurology service is consulted. MRI of the brain showed right MCA infarct. MRA of the head negative. CT angiogram of the neck showed no extracranial cerebrovascular disease. Echocardiogram with ejection fraction of 53% grade 1 diastolic dysfunction. TEE with systolic normal function. There was a mobile density off the lateral aspect of the posterior mitral valve leaflet consistent with vegetation/mitral valve endocarditis and placed on vancomycin for total 3 weeks ending 01/22/2015. Patient currently maintained on Coumadin with bridging of heparin. Wound care nurse consulted 12/21/2014 for sacral ulcer with dressing changes as advised. Hospital course further complicated by tachycardia and Pseudomonas UTI with Ceftazadime completed 01/03/2015. Physical and occupational therapy evaluations completed 01/01/2015 with recommendations of physical medicine rehabilitation consult. Patient to be admitted for a comprehensive inpatient rehabilitation program.   Past Medical History  Past Medical History  Diagnosis Date  . Hypertension   . Cardiac arrhythmia   . SVT (supraventricular tachycardia) ([facility])     "today, last week, 2 wk ago; maybe 1 month ago, etc; started w/in last 3-4 yrs"(07/20/2012)  . Fainting     "~ 1 month ago; probably related to SVT" (07/20/2012)  . Shortness of breath      "related to SVT episodes" (07/20/2012)  . Chest pain at rest     "related to SVT" (07/20/2012)  . Lupus (systemic lupus erythematosus) ([facility])   . GERD (gastroesophageal reflux disease)   . Fibromyalgia   . Irritable bowel syndrome (IBS)   . Hypercholesterolemia   . Heart murmur     "  small" (12/25/2014)  . TIA (transient ischemic attack) 12/21/2014  . Sleep apnea     "don't wear mask anymore" (12/25/2014)  . Anemia   . History of blood transfusion "several"    "related to low counts"  . Daily headache   . Arthritis     "hips" (12/25/2014)  . Anxiety     "sometimes; don't take anything for it" (12/25/2014)  . ESRD (end stage renal disease) on dialysis ([facility]) since 12/07/2014    "I've been doing it here on [facility]" (12/25/2014)  . Chronic renal failure, stage 4 (severe) ([facility])   . Renal insufficiency     Family History  family history includes BRCA 1/2 in her sister; Cancer in her mother.  Prior Rehab/Hospitalizations: No previous rehab admissions  Has the patient had major surgery during 100 days prior to admission? No  Current Medications   Current facility-administered medications:  . stroke: mapping our early stages of recovery book, , Does not apply, Once, Annia Belt, MD . 0.9 % sodium chloride infusion, , Intravenous, Continuous, Myrtie Soman, MD, Last Rate: 20 mL/hr at 12/26/14 0915 . 0.9 % sodium chloride infusion, , Intravenous, Once, Corky Sox, MD . acetaminophen (TYLENOL) tablet 500 mg, 500 mg, Oral, Q6H PRN, Dellia Nims, MD, 500 mg at 12/24/14 0755 . amLODipine (NORVASC) tablet 5 mg, 5 mg, Oral, QHS, Mauricia Area, MD . aspirin EC tablet 81 mg, 81 mg, Oral, Daily, Collier Salina, MD, 81 mg at 01/04/15 1353 . atorvastatin (LIPITOR) tablet 20 mg, 20 mg, Oral, q1800, Wynell Balloon, RPH, 20 mg at 01/03/15 1817 . calcium acetate (PHOSLO) capsule 1,334 mg, 1,334 mg, Oral, TID WC,  Corliss Parish, MD, 1,334 mg at 01/03/15 1817 . cyclobenzaprine (FLEXERIL) tablet 10 mg, 10 mg, Oral, QHS, Collier Salina, MD, 10 mg at 01/03/15 2200 . Darbepoetin Alfa (ARANESP) injection 100 mcg, 100 mcg, Intravenous, Q Thu-HD, Jamal Maes, MD, 100 mcg at 01/03/15 1040 . feeding supplement (PRO-STAT SUGAR FREE 64) liquid 30 mL, 30 mL, Oral, Daily, Jenifer A Williams, RD, 30 mL at 01/04/15 1350 . folic acid (FOLVITE) tablet 1 mg, 1 mg, Oral, Daily, Collier Salina, MD, 1 mg at 01/04/15 1354 . gelatin adsorbable (GELFOAM/SURGIFOAM) sponge 12-7 mm 1 each, 1 each, Topical, Once PRN, Iline Oven, MD . hydroxychloroquine (PLAQUENIL) tablet 400 mg, 400 mg, Oral, Daily, Michel Bickers, MD, 400 mg at 01/04/15 1354 . isosorbide mononitrate (IMDUR) 24 hr tablet 30 mg, 30 mg, Oral, Daily, Collier Salina, MD, 30 mg at 01/03/15 1243 . metoprolol succinate (TOPROL-XL) 24 hr tablet 25 mg, 25 mg, Oral, QHS, Mauricia Area, MD . morphine 2 MG/ML injection 2 mg, 2 mg, Intravenous, Q1H PRN, Serafina Mitchell, MD, 2 mg at 12/18/14 2318 . multivitamin (RENA-VIT) tablet 1 tablet, 1 tablet, Oral, QHS, Jamal Maes, MD, 1 tablet at 01/03/15 2200 . ondansetron (ZOFRAN) injection 4 mg, 4 mg, Intravenous, Once PRN, Lillia Abed, MD . ondansetron (ZOFRAN-ODT) disintegrating tablet 4 mg, 4 mg, Oral, Q6H PRN, Dellia Nims, MD, 4 mg at 01/03/15 1245 . oxyCODONE-acetaminophen (PERCOCET/ROXICET) 5-325 MG per tablet 1 tablet, 1 tablet, Oral, Q6H PRN, Collier Salina, MD, 1 tablet at 01/03/15 1243 . pantoprazole (PROTONIX) EC tablet 20 mg, 20 mg, Oral, Daily, Collier Salina, MD, 20 mg at 01/04/15 1353 . sodium chloride 0.9 % injection 10-40 mL, 10-40 mL, Intracatheter, PRN, Aldine Contes, MD, 10 mL at 01/04/15 0553 . vancomycin (VANCOCIN) IVPB 1000 mg/200 mL premix, 1,000 mg, Intravenous, Q T,Th,Sa-HD,  Darnell Level Mancheril, RPH, 1,000 mg at 01/03/15 1038  Patients Current Diet: Diet  clear liquid Room service appropriate?: Yes; Fluid consistency:: Thin  Precautions / Restrictions Precautions Precautions: Fall Precaution Comments: Pt's femoral cath is tunneled therefore she is able to get OOB. Restrictions Weight Bearing Restrictions: No   Has the patient had 2 or more falls or a fall with injury in the past year?No. Had 1 previous fall after passing out.  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 Rides the bus. Goes out daily. Is questioning whether she will be able to return to work?  Home Assistive Devices / Equipment Home Assistive Devices/Equipment: None Home Equipment: Bedside commode, Walker - standard  Prior Device Use: Indicate devices/aids used by the patient prior to current illness, exacerbation or injury? None of the above. Has access to a walker that belonged to her mother.  Prior Functional Level Prior Function Level of Independence: Independent  Self Care: Did the patient need help bathing, dressing, using the toilet or eating? Independent  Indoor Mobility: Did the patient need assistance with walking from room to room (with or without device)? Independent  Stairs: Did the patient need assistance with internal or external stairs (with or without device)? Independent  Functional Cognition: Did the patient need help planning regular tasks such as shopping or remembering to take medications? Independent  Current Functional Level Cognition  Overall Cognitive Status: Within Functional Limits for tasks assessed Orientation Level: Oriented X4   Extremity Assessment (includes Sensation/Coordination)  Upper Extremity Assessment: Defer to OT evaluation LUE Deficits / Details: AROM shoulder flexion 0-110 degrees, all other joints AROM WFLS. Strength 3+/5 in shoulder, elbow, and grip LUE Sensation: (WFLs)  Lower Extremity Assessment: RLE deficits/detail, LLE  deficits/detail RLE Deficits / Details: AROM ankle, knee WFL with strength 5/5 (isometric); AROM hip flexion 45 supine (limited by pain, however able to sit EOB at 90 degrees and felt "better") RLE Sensation: (denies changes to light touch') LLE Deficits / Details: AROM WFL; ankle DF 5/5; PF 4/5 with ?clonus noted; knee extension 5/5 LLE Sensation: (denies changes)    ADLs  Overall ADL's : Needs assistance/impaired Eating/Feeding: Modified independent, Bed level Grooming: Wash/dry hands, Wash/dry face, Set up, Sitting Upper Body Bathing: Set up, Sitting Lower Body Bathing: Moderate assistance, Sitting/lateral leans Upper Body Dressing : Set up, Sitting Toilet Transfer: +2 for physical assistance, Minimal assistance, RW Toileting- Clothing Manipulation and Hygiene: Total assistance Functional mobility during ADLs: Minimal assistance, +2 for safety/equipment General ADL Comments: Limited eval secondary to pt still with femoral HD cathetor and on bedrest. Pt issued red medium resistance theraputty and exercise handout for strengthening. Reviewed exercises and pt is able to complete them with supervision.     Mobility  Overal bed mobility: Needs Assistance Bed Mobility: Supine to Sit, Rolling Rolling: Supervision Supine to sit: HOB elevated, Supervision Sit to supine: Min assist General bed mobility comments: Incr time and use of rail    Transfers  Overall transfer level: Needs assistance Equipment used: Rolling walker (2 wheeled) Transfer via Lift Equipment: Stedy Transfers: Sit to/from Stand Sit to Stand: +2 physical assistance, Min assist Stand pivot transfers: +2 physical assistance, Min assist Lateral/Scoot Transfers: Min assist General transfer comment: Assist to bring hips up.    Ambulation / Gait / Stairs / Wheelchair Mobility  Ambulation/Gait Ambulation/Gait assistance: +2 physical assistance, Min assist Ambulation Distance (Feet): 80 Feet (x  2) Assistive device: Rolling walker (2 wheeled) Gait Pattern/deviations:  Step-through pattern, Decreased step length - right, Decreased step length - left, Staggering left General Gait Details: Assist for balance and stability Gait velocity: decr Gait velocity interpretation: Below normal speed for age/gender    Posture / Balance Dynamic Sitting Balance Sitting balance - Comments: x 10 minutes; able to shift weight for lateral scooting along EOB Balance Overall balance assessment: Needs assistance Sitting-balance support: No upper extremity supported, Feet supported Sitting balance-Leahy Scale: Good Sitting balance - Comments: x 10 minutes; able to shift weight for lateral scooting along EOB Standing balance support: Bilateral upper extremity supported Standing balance-Leahy Scale: Poor Standing balance comment: walker and min A    Special needs/care consideration BiPAP/CPAP: Has sleep apnea, but does not have a CPAP machine CPM No Continuous Drip IV Heparin drip Dialysis Yes Days Tu-Th-Sat Life Vest No Oxygen No Special Bed NO Trach Size NO Wound Vac (area) No  Skin Left arm incision from attempted fistula site. Has a sacral ulcer.  Bowel mgmt: No BM since 12/23/14 Bladder mgmt: Using bedpan, continent Diabetic mgmt No    Previous Home Environment Living Arrangements: Children, Parent (51 yo son; 91 yo daughter) Type of Home: Apartment Home Layout: One level Home Access: Stairs to enter Entrance Stairs-Rails: None Entrance Stairs-Number of Steps: 1 (curb in parking lot) Bathroom Shower/Tub: Chiropodist: Standard Bathroom Accessibility: Yes How Accessible: Accessible via walker Home Care Services: No Additional Comments: walker was her mothers  Discharge Living Setting Plans for Discharge Living Setting: Lives with (comment), Apartment (Lives with mom and 2 children.) Type of Home at Discharge:  Apartment Discharge Home Layout: One level Discharge Home Access: Stairs to enter Entrance Stairs-Number of Steps: 1 Discharge Bathroom Shower/Tub: Tub/shower unit Discharge Bathroom Toilet: Standard Discharge Bathroom Accessibility: Yes How Accessible: Accessible via walker Does the patient have any problems obtaining your medications?: No  Social/Family/Support Systems Patient Roles: Parent, Other (Comment) (Has a mom, 55 yo son and a 44 yo daughter.) Contact Information: Algis Greenhouse - mother Anticipated Caregiver: Mother Anticipated Caregiver's Contact Information: Langley Gauss - mom [phone] Ability/Limitations of Caregiver: Lives with mom and mom has been assisting caring for children. Mom does not work. Caregiver Availability: 24/7 Discharge Plan Discussed with Primary Caregiver: Yes Is Caregiver In Agreement with Plan?: Yes Does Caregiver/Family have Issues with Lodging/Transportation while Pt is in Rehab?: No  Goals/Additional Needs Patient/Family Goal for Rehab: PT/OT supervision to mod I goals Expected length of stay: 13-17 days Cultural Considerations: None Dietary Needs: Renal, carb modified, fluid restricted diet Equipment Needs: TBD Special Service Needs: Graft being placed 10/07 for dialysis. Has been clipped. Patient has been told that she needs to arrange for [facility] transportation. Pt/Family Agrees to Admission and willing to participate: Yes Program Orientation Provided & Reviewed with Pt/Caregiver Including Roles & Responsibilities: Yes  Decrease burden of Care through IP rehab admission: N/A  Possible need for SNF placement upon discharge: Not planned  Patient Condition: [name] medical and functional status has changed since the consult dated: 01/02/15 in which the Rehabilitation Physician determined and documented that the patient's condition is appropriate for intensive rehabilitative care in an inpatient rehabilitation facility. See "History of  Present Illness" (above) for medical update. Functional changes are: Currently requiring min assist +2 to ambulate 80 ft RW. Patient's medical and functional status update has been discussed with the Rehabilitation physician and patient remains appropriate for inpatient rehabilitation. Will admit to inpatient rehab tomorrow, Saturday.  Preadmission Screen Completed By: Retta Diones, 01/04/2015 2:25 PM ______________________________________________________________________  Discussed status with Dr. Naaman Plummer on 01/04/15 at 1425 and received telephone approval for admission tomorrow, Saturday.  Admission Coordinator: Retta Diones, time1425/Date10/07/16          Cosigned by: Meredith Staggers, MD at 01/04/2015 2:33 PM  Revision History     Date/Time User Provider Type Action   01/04/2015 2:33 PM Meredith Staggers, MD Physician Cosign   01/04/2015 2:25 PM Retta Diones, RN Rehab Admission Coordinator Sign

## 2015-01-07 NOTE — Care Management Note (Signed)
[facility] Individual Statement of Services  Patient Name:  [name]  Date:  01/07/2015  Welcome to the [facility].  Our goal is to provide you with an individualized program based on your diagnosis and situation, designed to meet your specific needs.  With this comprehensive rehabilitation program, you will be expected to participate in at least 3 hours of rehabilitation therapies Monday-Friday, with modified therapy programming on the weekends.  Your rehabilitation program will include the following services:  Physical Therapy (PT), Occupational Therapy (OT), 24 hour per day rehabilitation nursing, Therapeutic Recreaction (TR), Neuropsychology, Case Management (Social Worker), Rehabilitation Medicine, Nutrition Services and Pharmacy Services  Weekly team conferences will be held on Wednesday to discuss your progress.  Your Social Worker will talk with you frequently to get your input and to update you on team discussions.  Team conferences with you and your family in attendance may also be held.  Expected length of stay: 10-14 days  Overall anticipated outcome: mod/i level  Depending on your progress and recovery, your program may change. Your Social Worker will coordinate services and will keep you informed of any changes. Your Social Worker's name and contact numbers are listed  below.  The following services may also be recommended but are not provided by the [facility]:    [facility] will be made to provide these services after discharge if needed.  Arrangements include referral to agencies that provide these services.  Your insurance has been verified to be:  Medicaid Your primary doctor is:  Lin Landsman  Pertinent information will be shared with your doctor and your insurance company.  Social Worker:  Ovidio Kin, [place] or ([phone]  Information discussed with and copy given to patient by: [name], 01/07/2015, 1:18 PM

## 2015-01-07 NOTE — Progress Notes (Signed)
Occupational Therapy Session Note  Patient Details  Name: [name] MRN: [id number] Date of Birth: 02/26/1976  Today's Date: 01/07/2015 OT Individual Time: 0930-1030 OT Individual Time Calculation (min): 60 min    Short Term Goals: Week 1:  OT Short Term Goal 1 (Week 1): Pt. will bathe with supervision OT Short Term Goal 2 (Week 1): Pt will dress with supervision OT Short Term Goal 3 (Week 1): Pt will perform transfer to toilet with supervision OT Short Term Goal 4 (Week 1): Pt will do tub transfer with bench at min assist level  Skilled Therapeutic Interventions/Progress Updates:    Pt seen this session for BADL retraining with a focus on functional mobility, balance, and activity tolerance. Discussed patient's goals and pt stating that she feels fine and can not see why she could not go home today.  Reviewed her current limitations and need to develop balance and strength. Pt got to EOB with tactile cues, stood with slight steadying assist and stepped to w/c with HHA. At sink, pt bathed and dressed but needed cues as she seemed to be processing info slowly and needed some A with problem solving how to don her bra. She also was demonstrating some L inattention to environment and LLE. When cued to put her L leg in first, she kept putting R leg in.  When asked if she is noticing any difficulties, pt is quite adamant that there are no changes in her cognition/ visual skills.  Pt has very limited standing tolerance due to general fatigue and LLE pain. She also can not wash feet or don clothing over feet due to pain with lifting legs.  Pt practiced toilet transfers and has to have elevated seat as she needs max A to stand from low toilet.  Pt's mother present and observed second half of session.  PT arrived for her next session.   Therapy Documentation Precautions:  Precautions Precautions: Fall Precaution Comments: Pt's femoral cath is tunneled therefore she is able to get  OOB. Restrictions Weight Bearing Restrictions: No  Pain: Pain Assessment Pain Assessment: 0-10 Pain Score: 5  Pain Type: Acute pain Pain Location: Leg Pain Orientation: Left Pain Descriptors / Indicators: Tender;Sore Pain Onset: With Activity Pain Intervention(s): Rest ADL: See Function Navigator for Current Functional Status.   Therapy/Group: Individual Therapy  [place] 01/07/2015, 12:18 PM

## 2015-01-08 ENCOUNTER — Inpatient Hospital Stay (HOSPITAL_COMMUNITY): Payer: Medicaid Other | Admitting: Occupational Therapy

## 2015-01-08 ENCOUNTER — Inpatient Hospital Stay (HOSPITAL_COMMUNITY): Payer: Medicaid Other | Admitting: Physical Therapy

## 2015-01-08 DIAGNOSIS — M3214 Glomerular disease in systemic lupus erythematosus: Secondary | ICD-10-CM

## 2015-01-08 LAB — RENAL FUNCTION PANEL
Albumin: 2.1 g/dL — ABNORMAL LOW (ref 3.5–5.0)
Anion gap: 10 (ref 5–15)
BUN: 31 mg/dL — AB (ref 6–20)
CHLORIDE: 96 mmol/L — AB (ref 101–111)
CO2: 26 mmol/L (ref 22–32)
CREATININE: 6.67 mg/dL — AB (ref 0.44–1.00)
Calcium: 8.9 mg/dL (ref 8.9–10.3)
GFR calc Af Amer: 8 mL/min — ABNORMAL LOW (ref >60)
GFR, EST NON AFRICAN AMERICAN: 7 mL/min — AB (ref >60)
Glucose, Bld: 77 mg/dL (ref 65–99)
Phosphorus: 3.8 mg/dL (ref 2.5–4.6)
Potassium: 3.8 mmol/L (ref 3.5–5.1)
SODIUM: 132 mmol/L — AB (ref 135–145)

## 2015-01-08 LAB — PROTIME-INR
INR: 2.05 — ABNORMAL HIGH (ref 0.00–1.49)
PROTHROMBIN TIME: 23 s — AB (ref 11.6–15.2)

## 2015-01-08 LAB — CBC
HCT: 22.8 % — ABNORMAL LOW (ref 36.0–46.0)
Hemoglobin: 7.1 g/dL — ABNORMAL LOW (ref 12.0–15.0)
MCH: 29.3 pg (ref 26.0–34.0)
MCHC: 31.1 g/dL (ref 30.0–36.0)
MCV: 94.2 fL (ref 78.0–100.0)
PLATELETS: 330 10*3/uL (ref 150–400)
RBC: 2.42 MIL/uL — ABNORMAL LOW (ref 3.87–5.11)
RDW: 16 % — AB (ref 11.5–15.5)
WBC: 7.8 10*3/uL (ref 4.0–10.5)

## 2015-01-08 LAB — PREPARE RBC (CROSSMATCH)

## 2015-01-08 MED ORDER — SODIUM CHLORIDE 0.9 % IV SOLN: Freq: Once | INTRAVENOUS | Status: DC

## 2015-01-08 MED ORDER — WARFARIN SODIUM 5 MG PO TABS
5.0000 mg | ORAL_TABLET | Freq: Once | ORAL | Status: AC
  Administered 2015-01-08: 5 mg via ORAL
  Filled 2015-01-08: qty 1

## 2015-01-08 NOTE — Plan of Care (Signed)
Problem: RH BLADDER ELIMINATION Goal: RH STG MANAGE BLADDER WITH ASSISTANCE STG Manage Bladder With min Assistance  Outcome: Completed/Met Date Met:  01/08/15 Pt is oliguric, but continent of bladder when needing to use.

## 2015-01-08 NOTE — Progress Notes (Signed)
Subjective/Complaints: No issues overnite. Appreciate Nephro note  Soc- lives with mother who is not employed as well as 32 and 39 yo children  ROS- neg SOB, Constipation, pain  Objective: Vital Signs: Blood pressure 106/66, pulse 100, temperature 99.3 F (37.4 C), temperature source Oral, resp. rate 17, height 5' 3"  (1.6 m), weight 112.492 kg (248 lb), SpO2 99 %. No results found. Results for orders placed or performed during the hospital encounter of 01/05/15 (from the past 72 hour(s))  CBC WITH DIFFERENTIAL     Status: Abnormal   Collection Time: 01/05/15  9:10 PM  Result Value Ref Range   WBC 6.4 4.0 - 10.5 K/uL   RBC 2.73 (L) 3.87 - 5.11 MIL/uL   Hemoglobin 8.1 (L) 12.0 - 15.0 g/dL   HCT 25.5 (L) 36.0 - 46.0 %   MCV 93.4 78.0 - 100.0 fL   MCH 29.7 26.0 - 34.0 pg   MCHC 31.8 30.0 - 36.0 g/dL   RDW 15.6 (H) 11.5 - 15.5 %   Platelets 226 150 - 400 K/uL   Neutrophils Relative % 93 %   Lymphocytes Relative 4 %   Monocytes Relative 3 %   Eosinophils Relative 0 %   Basophils Relative 0 %   Neutro Abs 5.9 1.7 - 7.7 K/uL   Lymphs Abs 0.3 (L) 0.7 - 4.0 K/uL   Monocytes Absolute 0.2 0.1 - 1.0 K/uL   Eosinophils Absolute 0.0 0.0 - 0.7 K/uL   Basophils Absolute 0.0 0.0 - 0.1 K/uL   RBC Morphology POLYCHROMASIA PRESENT    WBC Morphology MILD LEFT SHIFT (1-5% METAS, OCC MYELO, OCC BANDS)   Comprehensive metabolic panel     Status: Abnormal   Collection Time: 01/05/15  9:10 PM  Result Value Ref Range   Sodium 134 (L) 135 - 145 mmol/L   Potassium 3.4 (L) 3.5 - 5.1 mmol/L    Comment: DELTA CHECK NOTED   Chloride 96 (L) 101 - 111 mmol/L   CO2 29 22 - 32 mmol/L   Glucose, Bld 94 65 - 99 mg/dL   BUN 15 6 - 20 mg/dL   Creatinine, Ser 3.02 (H) 0.44 - 1.00 mg/dL    Comment: DELTA CHECK NOTED   Calcium 7.7 (L) 8.9 - 10.3 mg/dL   Total Protein 5.2 (L) 6.5 - 8.1 g/dL   Albumin 2.1 (L) 3.5 - 5.0 g/dL   AST 37 15 - 41 U/L   ALT 8 (L) 14 - 54 U/L   Alkaline Phosphatase 55 38 - 126 U/L    Total Bilirubin 0.3 0.3 - 1.2 mg/dL   GFR calc non Af Amer 19 (L) >60 mL/min   GFR calc Af Amer 21 (L) >60 mL/min    Comment: (NOTE) The eGFR has been calculated using the CKD EPI equation. This calculation has not been validated in all clinical situations. eGFR's persistently <60 mL/min signify possible Chronic Kidney Disease.    Anion gap 9 5 - 15  Phosphorus     Status: None   Collection Time: 01/05/15  9:10 PM  Result Value Ref Range   Phosphorus 2.5 2.5 - 4.6 mg/dL  Protime-INR     Status: Abnormal   Collection Time: 01/06/15  6:08 AM  Result Value Ref Range   Prothrombin Time 20.7 (H) 11.6 - 15.2 seconds   INR 1.78 (H) 0.00 - 1.49  Protime-INR     Status: Abnormal   Collection Time: 01/07/15  5:18 AM  Result Value Ref Range   Prothrombin  Time 22.1 (H) 11.6 - 15.2 seconds   INR 1.94 (H) 0.00 - 1.49  Protime-INR     Status: Abnormal   Collection Time: 01/08/15  5:00 AM  Result Value Ref Range   Prothrombin Time 23.0 (H) 11.6 - 15.2 seconds   INR 2.05 (H) 0.00 - 1.49  CBC     Status: Abnormal   Collection Time: 01/08/15  5:00 AM  Result Value Ref Range   WBC 7.8 4.0 - 10.5 K/uL   RBC 2.42 (L) 3.87 - 5.11 MIL/uL   Hemoglobin 7.1 (L) 12.0 - 15.0 g/dL   HCT 22.8 (L) 36.0 - 46.0 %   MCV 94.2 78.0 - 100.0 fL   MCH 29.3 26.0 - 34.0 pg   MCHC 31.1 30.0 - 36.0 g/dL   RDW 16.0 (H) 11.5 - 15.5 %   Platelets 330 150 - 400 K/uL  Renal function panel     Status: Abnormal   Collection Time: 01/08/15  5:00 AM  Result Value Ref Range   Sodium 132 (L) 135 - 145 mmol/L   Potassium 3.8 3.5 - 5.1 mmol/L   Chloride 96 (L) 101 - 111 mmol/L   CO2 26 22 - 32 mmol/L   Glucose, Bld 77 65 - 99 mg/dL   BUN 31 (H) 6 - 20 mg/dL   Creatinine, Ser 6.67 (H) 0.44 - 1.00 mg/dL   Calcium 8.9 8.9 - 10.3 mg/dL   Phosphorus 3.8 2.5 - 4.6 mg/dL   Albumin 2.1 (L) 3.5 - 5.0 g/dL   GFR calc non Af Amer 7 (L) >60 mL/min   GFR calc Af Amer 8 (L) >60 mL/min    Comment: (NOTE) The eGFR has been  calculated using the CKD EPI equation. This calculation has not been validated in all clinical situations. eGFR's persistently <60 mL/min signify possible Chronic Kidney Disease.    Anion gap 10 5 - 15     HEENT: normal Cardio: RRR and no murmurs Resp: CTA B/L and unlabored GI: BS positive and non tender Extremity:  Pulses positive and No Edema Skin:   Intact Neuro: Alert/Oriented, Flat and Abnormal Motor 4-/5 LUE and LLE, 5- in RUE and RLE Musc/Skel:  Other no pain with AROM in UE or LEs Gen NAD   Assessment/Plan: 1. Functional deficits secondary to Right MCA which require 3+ hours per day of interdisciplinary therapy in a comprehensive inpatient rehab setting. Physiatrist is providing close team supervision and 24 hour management of active medical problems listed below. Physiatrist and rehab team continue to assess barriers to discharge/monitor patient progress toward functional and medical goals. FIM: Function - Bathing Position: Wheelchair/chair at sink Body parts bathed by patient: Right arm, Left arm, Chest, Abdomen, Front perineal area, Buttocks, Right upper leg, Left upper leg Body parts bathed by helper: Right lower leg, Left lower leg, Back Assist Level: Touching or steadying assistance(Pt > 75%)  Function- Upper Body Dressing/Undressing What is the patient wearing?: Bra, Pull over shirt/dress Bra - Perfomed by patient: Thread/unthread right bra strap, Thread/unthread left bra strap, Hook/unhook bra (pull down sports bra) Pull over shirt/dress - Perfomed by patient: Thread/unthread right sleeve, Thread/unthread left sleeve, Put head through opening, Pull shirt over trunk Assist Level: Supervision or verbal cues Function - Lower Body Dressing/Undressing What is the patient wearing?: Underwear, Socks, Shoes (skirt) Position: Wheelchair/chair at sink Underwear - Performed by patient: Pull underwear up/down Underwear - Performed by helper: Thread/unthread right underwear  leg, Thread/unthread left underwear leg Socks - Performed by helper: Don/doff  right sock, Don/doff left sock Shoes - Performed by helper: Don/doff right shoe, Don/doff left shoe, Fasten right, Fasten left Assist for lower body dressing: Touching or steadying assistance (Pt > 75%)  Function - Toileting Toileting activity did not occur: Safety/medical concerns Toileting steps completed by patient: Adjust clothing prior to toileting, Performs perineal hygiene, Adjust clothing after toileting Toileting steps completed by helper: Performs perineal hygiene Assist level: Touching or steadying assistance (Pt.75%)  Function - Toilet Transfers Toilet transfer activity did not occur: Safety/medical concerns Toilet transfer assistive device: Elevated toilet seat/BSC over toilet, Grab bar Assist level to toilet: Moderate assist (Pt 50 - 74%/lift or lower) Assist level from toilet: Moderate assist (Pt 50 - 74%/lift or lower) Assist level to bedside commode (at bedside): Moderate assist (Pt 50 - 74%/lift or lower) Assist level from bedside commode (at bedside): Moderate assist (Pt 50 - 74%/lift or lower)  Function - Chair/bed transfer Chair/bed transfer activity did not occur: Safety/medical concerns Chair/bed transfer method: Stand pivot Chair/bed transfer assist level: Moderate assist (Pt 50 - 74%/lift or lower) Chair/bed transfer details: Verbal cues for safe use of DME/AE, Verbal cues for technique  Function - Locomotion: Wheelchair Will patient use wheelchair at discharge?: No Max wheelchair distance: 100 Assist Level: Moderate assistance (Pt 50 - 74%) Assist Level: Moderate assistance (Pt 50 - 74%) Wheel 150 feet activity did not occur: Safety/medical concerns Turns around,maneuvers to table,bed, and toilet,negotiates 3% grade,maneuvers on rugs and over doorsills: No Function - Locomotion: Ambulation Assistive device: Walker-rolling Max distance: 80 Assist level: Touching or steadying  assistance (Pt > 75%) Assist level: Touching or steadying assistance (Pt > 75%) Assist level: Touching or steadying assistance (Pt > 75%) Walk 150 feet activity did not occur: Safety/medical concerns Walk 10 feet on uneven surfaces activity did not occur: Safety/medical concerns Assist level: Touching or steadying assistance (Pt > 75%) (ramp)  Function - Comprehension Comprehension: Auditory Comprehension assist level: Understands complex 90% of the time/cues 10% of the time  Function - Expression Expression: Verbal Expression assist level: Expresses basic needs/ideas: With extra time/assistive device  Function - Social Interaction Social Interaction assist level: Interacts appropriately with others - No medications needed.  Function - Problem Solving Problem solving assist level: Solves basic 90% of the time/requires cueing < 10% of the time  Function - Memory Memory assist level: Complete Independence: No helper Patient normally able to recall (first 3 days only): Location of own room, Staff names and faces, Current season, That he or she is in a hospital   Medical Problem List and Plan: 1. 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 DVT Prophylaxis/Anticoagulation: Coumadin per pharmacy protocol and no bridging required. Monitor for any bleeding episodes, INR therapeutic as of 10/11 3. Pain Management: Oxycodone and flexeril as needed. Monitor with increased mobility 4. Lupus nephritis with end-stage renal disease. Continue hemodialysis as advised. Permanent access graft placement 01/04/2015.Nephro following 5. Neuropsych: This patient is capable of making decisions on her own behalf. 6. Skin/Wound Care: Routine skin care 7. Fluids/Electrolytes/Nutrition: Strict I&O with follow-up chemistries, fair intake ~50% 8. Endocarditis of mitral valve. Unable to rule out infectious endocarditis as patient is  immunosuppressed and leukopenic. Blood cultures negative. Plan vancomycin until 01/22/2015. 9. Hypertension. Norvasc 5 mg daily, Imdur or 30 mg daily, Toprol-XL 25 mg daily at bedtime. Monitor with increased mobility 10. Pseudomonas urinary tract infection. Ceftazadime completed 01/03/2015 11. Morbid obesity. Dietary follow-up 12. Sacral decubitus. Foam dressing change every  3 days as needed pressure release 13. Hyperlipidemia. Lipitor LOS (Days) 3 A FACE TO FACE EVALUATION WAS PERFORMED  KIRSTEINS,ANDREW E 01/08/2015, 6:03 AM

## 2015-01-08 NOTE — Progress Notes (Signed)
Physical Therapy Session Note  Patient Details  Name: [name] MRN: [id number] Date of Birth: October 20, 1975  Today's Date: 01/08/2015 PT Individual Time: 0800-0902 and 1300-1310 PT Individual Time Calculation (min): 62 min and 10 min (total 72 min)   Short Term Goals: Week 1:  PT Short Term Goal 1 (Week 1): Pt will be Min guard for transfers PT Short Term Goal 2 (Week 1): Pt will be SBA for all bed mobility PT Short Term Goal 3 (Week 1): Pt will be Min Guard for gait 150' PT Short Term Goal 4 (Week 1): Pt will perform stairs 12 with Min A  Skilled Therapeutic Interventions/Progress Updates:    0800-0900: Pt received supine in bed; c/o L knee pain as described below and agreeable to treatment. Supine>sit with close supervision and use of bedrails with HOB elevated, increased time to perform due to generalized weakness. Stand pivot bed >w/c with rollator and close supervision with pt letting go of rollator halfway through transfers; educated on importance of maintaining rollator closer to body if using during transfer to allow for support and balance. W/c populsion x100' with BUEs and increased time due to UE strength deficits, decreased endurance. Gait with rollator for 1 trial of 60', close supervision and requires seated rest break after due to fatigue. Standing activity tolerance while pt engaged in playing game; able to stand approximately 2 min at a time x5 trials, requires seated rest break between due to fatigue. Gait with rollator to return to room x75'. Bathroom transfer and toileting performed with supervision. Pt returned to bed and sit >supine all with supervision. Remained supine in bed with all needs in reach at completion of session.  1300-1310: Pt received asleep seated in w/c; reports c/o continued nausea from blood loss, as well as concern regarding L knee stating "I think I've overdone it on this knee today". Pt declines participation in session due to not feeling well,  however would like to return to bed. Stand pivot transfer w/c >bed with no AD and close supervision/CGA due to pt report of knee pain and possible buckling. Sit >supine with supervision and bed rails. Min cues for scooting toward HOB to improve positioning in bed. Remained supine in bed with all needs in reach at completion of session. Missed 20 minutes skilled PT due to pain/nausea.   Therapy Documentation Precautions:  Precautions Precautions: Fall Precaution Comments: Pt's femoral cath is tunneled therefore she is able to get OOB. Restrictions Weight Bearing Restrictions: No General: PT Amount of Missed Time (min): 20 Minutes PT Missed Treatment Reason: Pain;Other (Comment) (nausea) Pain: Pain Assessment Pain Assessment: 0-10 Pain Score: 4  Pain Type: Acute pain Pain Location: Knee Pain Orientation: Left Pain Descriptors / Indicators: Sore;Aching Pain Onset: With Activity Patients Stated Pain Goal: 1 Pain Intervention(s): Repositioned;Rest Multiple Pain Sites: No   See Function Navigator for Current Functional Status.   Therapy/Group: Individual Therapy  Luberta Mutter 01/08/2015, 9:55 AM

## 2015-01-08 NOTE — Progress Notes (Signed)
Occupational Therapy Session Note  Patient Details  Name: Cincere Deprey MRN: [id number] Date of Birth: 1976/02/16  Today's Date: 01/08/2015 OT Individual Time: 0930-1030 OT Individual Time Calculation (min): 60 min    Short Term Goals: Week 1:  OT Short Term Goal 1 (Week 1): Pt. will bathe with supervision OT Short Term Goal 2 (Week 1): Pt will dress with supervision OT Short Term Goal 3 (Week 1): Pt will perform transfer to toilet with supervision OT Short Term Goal 4 (Week 1): Pt will do tub transfer with bench at min assist level  Skilled Therapeutic Interventions/Progress Updates:    1:1 focus on sit to stands from different height surfaces.  Assistance level varied from supervision to mod A (the lower the surface the more assistance). Focus on LE strengthening with propelling w/c with bilateral LEs (without using UEs) without cushion for better positioning. Pt propelled from room to gym. Performed sit to stand from lower w/c with max A but able to perform stand pivot with min A. Performed bilateral UE strengthening with 2 lb weighted bar with all UB mm groups in all planes.  Transitioned to standing balance activities to improved dynamic balance and confidence with standing activities without UE support.  Pt able to perform with min A and even transitioned to short distance functional ambulation without AD. Transition back to room via wheelchair.   Therapy Documentation Precautions:  Precautions Precautions: Fall Precaution Comments: Pt's femoral cath is tunneled therefore she is able to get OOB. Restrictions Weight Bearing Restrictions: No Pain: C/o mild back pain (PTA pain).  Allowed rest breaks as needed and applied kinesotape (power strip star) for pain relief throughout day.  Pt also c/o nausea in session; requested meds- reported to RN  See Function Navigator for Current Functional Status.   Therapy/Group: Individual Therapy  Willeen Cass [facility] 01/08/2015, 10:55  AM

## 2015-01-08 NOTE — Progress Notes (Signed)
Patient ID: [name], female   DOB: [birth year], 39 y.o.   MRN: [id number]  [facility] Progress Note    Subjective:   Reports some weakness and dizziness upon standing   Objective:   BP 106/66 mmHg  Pulse 100  Temp(Src) 99.3 F (37.4 C) (Oral)  Resp 17  Ht 5\' 3"  (1.6 m)  Wt 112.946 kg (249 lb)  BMI 44.12 kg/m2  SpO2 99%  Intake/Output: I/O last 3 completed shifts: In: 360 [P.O.:360] Out: -    Intake/Output this shift:    Weight change: 0.454 kg (1 lb)  Physical Exam: Gen:WD [name] sitting in wheelchair CVS:no rub Resp:cta BWI:OMBTDH Ext:L thigh AVG +T/B  Labs: BMET  Recent Labs Lab 01/02/15 0605 01/03/15 0500 01/04/15 0104 01/05/15 0405 01/05/15 2110 01/08/15 0500  NA 137 135 137 137 134* 132*  K 4.0 3.7 4.1 4.8 3.4* 3.8  CL 100* 99* 101 101 96* 96*  CO2 27 25 26 26 29 26   GLUCOSE 78 75 78 105* 94 77  BUN 39* 44* 25* 38* 15 31*  CREATININE 3.43* 3.84* 3.20* 4.69* 3.02* 6.67*  ALBUMIN 2.1* 2.2*  --  2.1* 2.1* 2.1*  CALCIUM 7.9* 8.7* 8.7* 8.6* 7.7* 8.9  PHOS 3.4 3.6  --  4.8* 2.5 3.8   CBC  Recent Labs Lab 01/02/15 0605  01/04/15 0104 01/05/15 0405 01/05/15 2110 01/08/15 0500  WBC 2.0*  < > 4.9 6.0 6.4 7.8  NEUTROABS 1.4*  --   --   --  5.9  --   HGB 8.6*  < > 8.7* 7.6* 8.1* 7.1*  HCT 27.5*  < > 27.7* 24.4* 25.5* 22.8*  MCV 93.9  < > 94.2 94.6 93.4 94.2  PLT 159  < > 204 236 226 330  < > = values in this interval not displayed.  @IMGRELPRIORS @ Medications:    . aspirin EC  81 mg Oral Daily  . atorvastatin  20 mg Oral q1800  . bisacodyl  5 mg Oral BID  . calcium acetate  1,334 mg Oral TID WC  . cyclobenzaprine  10 mg Oral QHS  . [START ON 01/10/2015] darbepoetin (ARANESP) injection - DIALYSIS  200 mcg Intravenous Q Thu-HD  . feeding supplement (PRO-STAT SUGAR FREE 64)  30 mL Oral Daily  . heparin  40 Units/kg Dialysis Once in dialysis  . hydroxychloroquine  400 mg Oral Daily  . isosorbide mononitrate  30 mg  Oral Daily  . metoprolol succinate  25 mg Oral QHS  . multivitamin  1 tablet Oral QHS  . pantoprazole  20 mg Oral Daily  . vancomycin  1,250 mg Intravenous Q T,Th,Sa-HD  . Warfarin - Pharmacist Dosing Inpatient   Does not apply [id number]     Assessment/ Plan:   1. CVA- doing well in rehab 2. ESRD- continue with HD qTTS (first HD session 12/07/14) 3. Anemia: on ESA with an acute drop and is symptomatic. 1. Guaiac stools and transfuse 2 units of PRBC's with HD today. 4. CKD-MBD: continue with binders 5. Nutrition:per primary svc 6. Vascular access- left femoral AVG placed 01/04/15 by Dr. Donnetta Hutching as well as right femoral tunneled HD cath 7. Hypertension:stable off amlodipine 8. Endocarditis- presumably Liebman-Sachs 9. SLE- biopsy on 8/25. This biopsy was complicated by a hematoma and it revealed again diffuse proliferative changes with crescents indicating that it could respond to immune suppressing therapy. She under went IV solumedrol, then high dose prednisone and was also on max dose cellcept. These agents have been  weaned off as she has progressed to [place]. 10. SVT- on beta blocker  [name] A 01/08/2015, 9:14 AM

## 2015-01-08 NOTE — Progress Notes (Signed)
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 Food Swelling    Red peppers    Patient Measurements: Height: 5\' 3"  (160 cm) Weight: 249 lb (112.946 kg) IBW/kg (Calculated) : 52.4   Vital Signs: Temp: 99.3 F (37.4 C) (10/11 0503) Temp Source: Oral (10/11 0503) BP: 106/66 mmHg (10/11 0503) Pulse Rate: 100 (10/11 0503)  Labs:  Recent Labs  01/05/15 2110 01/06/15 0608 01/07/15 0518 01/08/15 0500  HGB 8.1*  --   --  7.1*  HCT 25.5*  --   --  22.8*  PLT 226  --   --  330  LABPROT  --  20.7* 22.1* 23.0*  INR  --  1.78* 1.94* 2.05*  CREATININE 3.02*  --   --  6.67*    Estimated Creatinine Clearance: 13.8 mL/min (by C-G formula based on Cr of 6.67).   Assessment:  Anticoagulation: 66 yoF with R MCA stroke in setting of possible Libman Sacks endocarditis, currently on coumadin, INR 2.05, Hgb 7.1 < 8.1, Pltc stable, will give 2 units PRBC today with HD, and check Guaiac stool. Per neurology note on 9/28: Plan to d/c ASA when INR = 2-2.5  ID: Pt is on Vancomycin for endocarditis. WBC 7.8K, afebrile. TEE 9/27 showed MV vegetation consistent with endocarditis, cannot r/o infectious endocarditis, so continue vanc though 10/25. He also has ESRD, on HD TTS, plan for HD today.   10/8 preHD vanc = 7 -> gave 2g after HD on 10/8.   Ceftriaxone 9/5>>9/6, 9/7>>9/8  Cipro 9/6>>9/7  Cephalexin 9/8>>9/12  Cefuroxime>9/26 x 1 for AVG surgery  Vancomycin 9/27>10/1, 10/3>> want to continue for 3 more weeks to 10/25  Cefazolin 10/1>>10/3  Ceftazidime 10/3>>10/6   Urine 9/5>> > K pneumo susceptible to CTX/Cefazolin  BC x 2 9/26> ngF  BC x 2 9/27> ngF  Urine 10/1>> pseudomonas (I to cipro)    Goal of Therapy:  Monitor platelets by anticoagulation protocol: Yes   Plan:  -Wrfarin 5 mg po x1 tonight -Daily INR, CBC  q72h -Vancomycin 1250 mg IV HD-TThSa -Monitor HD schedule and tolerance  -DC aspirin once INR 2 to 2.5 (left sticky note)  -f/u FOB   Maryanna Shape, PharmD, BCPS  Clinical Pharmacist  Pager: [phone]   01/08/2015 1:10 PM

## 2015-01-08 NOTE — Progress Notes (Signed)
Occupational Therapy Session Note  Patient Details  Name: [name] MRN: [id number] Date of Birth: 02-06-1976  Today's Date: 01/08/2015 OT Individual Time: 1102-1200 OT Individual Time Calculation (min): 58 min    Short Term Goals: Week 1:  OT Short Term Goal 1 (Week 1): Pt. will bathe with supervision OT Short Term Goal 2 (Week 1): Pt will dress with supervision OT Short Term Goal 3 (Week 1): Pt will perform transfer to toilet with supervision OT Short Term Goal 4 (Week 1): Pt will do tub transfer with bench at min assist level  Skilled Therapeutic Interventions/Progress Updates:    Treatment session with focus on sit <> stand, standing tolerance, functional use of LUE, Lt attention, and overall activity tolerance.  Pt reports feeling nauseous and light headed due to drop in RBCs and hemoglobin but willing to participate.  Pt propelled w/c to DayRoom  alternating between BUE and BLE.  Engaged in sit > stand and completed standing task at table to address dynamic standing balance and use of BUE to simulate meal prep or grooming tasks.  Pt required min assist for initial sit > stand, with each subsequent stands being supervision.  Pt with no c/o lightheadedness of nausea during session.    Therapy Documentation Precautions:  Precautions Precautions: Fall Precaution Comments: Pt's femoral cath is tunneled therefore she is able to get OOB. Restrictions Weight Bearing Restrictions: No Pain: Pain Assessment Pain Assessment: 0-10 Pain Score: 4  Pain Type: Acute pain Pain Location: Knee Pain Orientation: Left Pain Descriptors / Indicators: Sore;Aching Pain Onset: With Activity Patients Stated Pain Goal: 1 Pain Intervention(s): Repositioned;Rest Multiple Pain Sites: No  See Function Navigator for Current Functional Status.   Therapy/Group: Individual Therapy  Simonne Come 01/08/2015, 12:10 PM

## 2015-01-09 ENCOUNTER — Inpatient Hospital Stay (HOSPITAL_COMMUNITY): Payer: Medicaid Other | Admitting: Physical Therapy

## 2015-01-09 ENCOUNTER — Inpatient Hospital Stay (HOSPITAL_COMMUNITY): Payer: Medicaid Other | Admitting: Occupational Therapy

## 2015-01-09 LAB — CBC
HEMATOCRIT: 31.2 % — AB (ref 36.0–46.0)
HEMOGLOBIN: 10.1 g/dL — AB (ref 12.0–15.0)
MCH: 29.5 pg (ref 26.0–34.0)
MCHC: 32.4 g/dL (ref 30.0–36.0)
MCV: 91.2 fL (ref 78.0–100.0)
Platelets: 298 10*3/uL (ref 150–400)
RBC: 3.42 MIL/uL — ABNORMAL LOW (ref 3.87–5.11)
RDW: 17.4 % — AB (ref 11.5–15.5)
WBC: 9.8 10*3/uL (ref 4.0–10.5)

## 2015-01-09 LAB — BRUCELLA IGG, IGM
BRUCELLA IGG: 0.05
Brucella, IgM: 0.03

## 2015-01-09 LAB — PROTIME-INR
INR: 2.14 — ABNORMAL HIGH (ref 0.00–1.49)
Prothrombin Time: 23.7 seconds — ABNORMAL HIGH (ref 11.6–15.2)

## 2015-01-09 LAB — TYPE AND SCREEN
ABO/RH(D): A POS
ANTIBODY SCREEN: NEGATIVE
Unit division: 0
Unit division: 0

## 2015-01-09 LAB — OCCULT BLOOD X 1 CARD TO LAB, STOOL: Fecal Occult Bld: POSITIVE — AB

## 2015-01-09 MED ORDER — WARFARIN SODIUM 5 MG PO TABS
5.0000 mg | ORAL_TABLET | Freq: Every day | ORAL | Status: DC
  Administered 2015-01-09: 5 mg via ORAL
  Filled 2015-01-09: qty 1

## 2015-01-09 NOTE — Patient Care Conference (Signed)
Inpatient RehabilitationTeam Conference and Plan of Care Update Date: 01/09/2015   Time: 11;20 AM    Patient Name: [name]      Medical Record Number: [id number]  Date of Birth: 03-12-1976 Sex: Female         Room/Bed: 4M01C/4M01C-01 Payor Info: Payor: MEDICAID [place] / Plan: MEDICAID [place] ACCESS / Product Type: *No Product type* /    Admitting Diagnosis: RIGHT CVA  Admit Date/Time:  01/05/2015  4:40 PM Admission Comments: No comment available   Primary Diagnosis:  <principal problem not specified> Principal Problem: <principal problem not specified>  Patient Active Problem List   Diagnosis Date Noted  . Post-operative pain 01/10/2015  . Left hemiparesis ([place])   . 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 Anemia due to bone marrow failure ([place])   . 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 Other pancytopenia ([place])   . Pressure ulcer 12/21/2014  . Steal syndrome as complication of dialysis access ([place])   . ESRD (end stage renal disease) on dialysis ([place])   . UTI (lower urinary tract infection)   . Retroperitoneal hematoma   . Lupus nephritis ([place])   . Hypertension 07/20/2012  . Weight loss, unintentional 07/20/2012    Expected Discharge Date: Expected Discharge Date: 01/17/15  Team Members Present: Physician leading conference: Dr. Alysia Penna Social Worker Present: Ovidio Kin, LCSW Nurse Present: Heather Roberts, RN PT Present: Kem Parkinson, PT OT Present: Simonne Come, OT PPS Coordinator present : Daiva Nakayama, RN, CRRN     Current Status/Progress Goal Weekly Team Focus  Medical   min/sup ADL, poor awareness of deficits, post op pain post graft placement  Home Sup vs Mod I depending on LOS  post op pain management   Bowel/Bladder   Continent to bowel.Oliguric(HEMO T_T_S).LBM 10/12  To continue continent to bowel with min. assisst.  To monitor bowel function Q shift.   Swallow/Nutrition/ Hydration     na        ADL's   min-supervision UB bathing and dressing, max-total assist LB dressing, min-mod assist transfers  Mod I overall, supervision shower transfer  transfers, standing balance, LB dressing, activity tolerance   Mobility   supervision/CGA transfers, supervision gait x75' with rollator, minA stairs  mod I overall  activity tolerance, LE strength, dynamic standing balance   Communication     na        Safety/Cognition/ Behavioral Observations  Pt. is following the safety plan recomendations.  To keep pt. free from falls.  To keep pt. safe by making rounds and following safety plan   Pain   Pain on lt. thigh,using PRN Percocet 325 mg. Q 4 hrs.  To keep pain levels less than 3,on scale 1 to 10.  To monitor for pain levels Q 2-3 hrs.   Skin   No skin breakdown.  To keep skin free of breakdown and infection.  To monitor skin Q shift.      *See Care Plan and progress notes for long and short-term goals.  Barriers to Discharge: still requairing physical assist    Possible Resolutions to Barriers:  cont NM re education    Discharge Planning/Teaching Needs:  Home with Mom and her children-Mom able to assist if necessary      Team Discussion:  Goals-mod/i-supervision shower transfer. L-thigh pain-surgical site. Awareness limited. New HD being arranged. Wants to go home soon to be with her children-daughter having difficulty coping with Mom in hospital  Revisions to Treatment Plan:  None  Continued Need for Acute Rehabilitation Level of Care: The patient requires daily medical management by a physician with specialized training in physical medicine and rehabilitation for the following conditions: Daily direction of a multidisciplinary physical rehabilitation program to ensure safe treatment while eliciting the highest outcome that is of practical value to the patient.: Yes Daily medical management of patient stability for increased activity during participation in an intensive rehabilitation regime.:  Yes Daily analysis of laboratory values and/or radiology reports with any subsequent need for medication adjustment of medical intervention for : Neurological problems;Other;Post surgical problems  [name] 01/10/2015, 8:46 AM

## 2015-01-09 NOTE — Progress Notes (Signed)
Occupational Therapy Session Note  Patient Details  Name: [name] MRN: [id number] Date of Birth: [birth year]  Today's Date: 01/09/2015 OT Individual Time: 4332-9518 OT Individual Time Calculation (min): 95 min    Short Term Goals: Week 1:  OT Short Term Goal 1 (Week 1): Pt. will bathe with supervision OT Short Term Goal 2 (Week 1): Pt will dress with supervision OT Short Term Goal 3 (Week 1): Pt will perform transfer to toilet with supervision OT Short Term Goal 4 (Week 1): Pt will do tub transfer with bench at min assist level  Skilled Therapeutic Interventions/Progress Updates:    ADL retraining with focus on functional mobility, sit > stand, and activity tolerance.  Pt reports feeling better after receiving blood last night.  Min assist sit > stand due to initial LOB and instability upon standing.  Steady assist with all transfers this session due to mild instability.  Pt completed toileting tasks with supervision.  Bathing and dressing completed at sit > stand level at sink with cues for techniques to increase ability to reach feet for washing and donning/doffing socks.  Ambulated to ADL apt engaged in furniture transfer from low, soft couch with pt requiring mod assist (lifting) due to low surface.  Educated on tub/shower transfers with use of tub bench with pt return demonstrating with min assist out due to requiring assist to bring LLE over tub ledge.  Discussed d/c planning with pt requesting to d/c Friday due to concerns with children's coping, educated on purpose of rehab and current goals.  Plan to further discuss to make best plan for pt, notified PT as well.  Therapy Documentation Precautions:  Precautions Precautions: Fall Precaution Comments: Pt's femoral cath is tunneled therefore she is able to get OOB. Restrictions Weight Bearing Restrictions: No Pain:  Pt with no c/o pain this session  See Function Navigator for Current Functional Status.   Therapy/Group:  Individual Therapy  Simonne Come 01/09/2015, 9:13 AM

## 2015-01-09 NOTE — Progress Notes (Signed)
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 Food Swelling    Red peppers    Patient Measurements: Height: 5\' 3"  (160 cm) Weight: 246 lb 14.6 oz (112 kg) IBW/kg (Calculated) : 52.4   Vital Signs: Temp: 98.7 F (37.1 C) (10/12 0456) Temp Source: Oral (10/12 0456) BP: 140/72 mmHg (10/12 0456) Pulse Rate: 105 (10/12 0456)  Labs:  Recent Labs  01/07/15 0518 01/08/15 0500 01/09/15 0505  HGB  --  7.1*  --   HCT  --  22.8*  --   PLT  --  330  --   LABPROT 22.1* 23.0* 23.7*  INR 1.94* 2.05* 2.14*  CREATININE  --  6.67*  --     Estimated Creatinine Clearance: 13.8 mL/min (by C-G formula based on Cr of 6.67).   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 Per neurology note on 9/28: Plan to d/c ASA when INR = 2-2.5  Goal of Therapy:  Monitor platelets by anticoagulation protocol: Yes   Plan:  -Wrfarin 5 mg po x1 tonight -Daily INR, CBC q72h -DC aspirin (discussed with Reesa Chew, PA) -f/u FOB, S/Sx of bleeding  Maryanna Shape, PharmD, BCPS  Clinical Pharmacist  Pager: [phone]   01/09/2015 9:19 AM

## 2015-01-09 NOTE — Progress Notes (Signed)
Subjective/Complaints: Left thigh pain. Appreciate Nephro note   ROS- neg SOB, Constipation, pain  Objective: Vital Signs: Blood pressure 140/72, pulse 105, temperature 98.7 F (37.1 C), temperature source Oral, resp. rate 16, height _0  (1.6 m), weight 112 kg (246 lb 14.6 oz), SpO2 98 %. No results found. Results for orders placed or performed during the hospital encounter of 01/05/15 (from the past 72 hour(s))  Protime-INR     Status: Abnormal   Collection Time: 01/07/15  5:18 AM  Result Value Ref Range   Prothrombin Time 22.1 (H) 11.6 - 15.2 seconds   INR 1.94 (H) 0.00 - 1.49  Protime-INR     Status: Abnormal   Collection Time: 01/08/15  5:00 AM  Result Value Ref Range   Prothrombin Time 23.0 (H) 11.6 - 15.2 seconds   INR 2.05 (H) 0.00 - 1.49  CBC     Status: Abnormal   Collection Time: 01/08/15  5:00 AM  Result Value Ref Range   WBC 7.8 4.0 - 10.5 K/uL   RBC 2.42 (L) 3.87 - 5.11 MIL/uL   Hemoglobin 7.1 (L) 12.0 - 15.0 g/dL   HCT 22.8 (L) 36.0 - 46.0 %   MCV 94.2 78.0 - 100.0 fL   MCH 29.3 26.0 - 34.0 pg   MCHC 31.1 30.0 - 36.0 g/dL   RDW 16.0 (H) 11.5 - 15.5 %   Platelets 330 150 - 400 K/uL  Renal function panel     Status: Abnormal   Collection Time: 01/08/15  5:00 AM  Result Value Ref Range   Sodium 132 (L) 135 - 145 mmol/L   Potassium 3.8 3.5 - 5.1 mmol/L   Chloride 96 (L) 101 - 111 mmol/L   CO2 26 22 - 32 mmol/L   Glucose, Bld 77 65 - 99 mg/dL   BUN 31 (H) 6 - 20 mg/dL   Creatinine, Ser 6.67 (H) 0.44 - 1.00 mg/dL   Calcium 8.9 8.9 - 10.3 mg/dL   Phosphorus 3.8 2.5 - 4.6 mg/dL   Albumin 2.1 (L) 3.5 - 5.0 g/dL   GFR calc non Af Amer 7 (L) >60 mL/min   GFR calc Af Amer 8 (L) >60 mL/min    Comment: (NOTE) The eGFR has been calculated using the CKD EPI equation. This calculation has not been validated in all clinical situations. eGFR's persistently <60 mL/min signify possible Chronic Kidney Disease.    Anion gap 10 5 - 15  Prepare RBC     Status: None    Collection Time: 01/08/15  9:19 AM  Result Value Ref Range   Order Confirmation ORDER PROCESSED BY BLOOD BANK   Type and screen [place]     Status: None (Preliminary result)   Collection Time: 01/08/15  3:40 PM  Result Value Ref Range   ABO/RH(D) A POS    Antibody Screen NEG    Sample Expiration 01/11/2015    Unit Number [id number]    Blood Component Type RED CELLS,LR    Unit division 00    Status of Unit ISSUED    Transfusion Status OK TO TRANSFUSE    Crossmatch Result Compatible    Unit Number [id number]    Blood Component Type RBC, LR IRR    Unit division 00    Status of Unit ISSUED    Transfusion Status OK TO TRANSFUSE    Crossmatch Result Compatible      HEENT: normal Cardio: RRR and no murmurs Resp: CTA B/L and unlabored GI: BS  positive and non tender Extremity:  Pulses positive and No Edema, bruising Left thigh around surgical site Skin:   Intact Neuro: Alert/Oriented, Flat and Abnormal Motor 4-/5 LUE and LLE, 5- in RUE and RLE Musc/Skel:  Other no pain with AROM in UE or LEs Gen NAD   Assessment/Plan: 1. Functional deficits secondary to Right MCA which require 3+ hours per day of interdisciplinary therapy in a comprehensive inpatient rehab setting. Physiatrist is providing close team supervision and 24 hour management of active medical problems listed below. Physiatrist and rehab team continue to assess barriers to discharge/monitor patient progress toward functional and medical goals. FIM: Function - Bathing Position: Wheelchair/chair at sink Body parts bathed by patient: Right arm, Left arm, Chest, Abdomen, Front perineal area, Buttocks, Right upper leg, Left upper leg Body parts bathed by helper: Right lower leg, Left lower leg, Back Assist Level: Touching or steadying assistance(Pt > 75%)  Function- Upper Body Dressing/Undressing What is the patient wearing?: Bra, Pull over shirt/dress Bra - Perfomed by patient: Thread/unthread  right bra strap, Thread/unthread left bra strap, Hook/unhook bra (pull down sports bra) Pull over shirt/dress - Perfomed by patient: Thread/unthread right sleeve, Thread/unthread left sleeve, Put head through opening, Pull shirt over trunk Assist Level: Supervision or verbal cues Function - Lower Body Dressing/Undressing What is the patient wearing?: Underwear, Socks, Shoes (skirt) Position: Wheelchair/chair at sink Underwear - Performed by patient: Pull underwear up/down Underwear - Performed by helper: Thread/unthread right underwear leg, Thread/unthread left underwear leg Socks - Performed by helper: Don/doff right sock, Don/doff left sock Shoes - Performed by helper: Don/doff right shoe, Don/doff left shoe, Fasten right, Fasten left Assist for lower body dressing: Touching or steadying assistance (Pt > 75%)  Function - Toileting Toileting activity did not occur: Safety/medical concerns Toileting steps completed by patient: Adjust clothing prior to toileting, Performs perineal hygiene, Adjust clothing after toileting Toileting steps completed by helper: Performs perineal hygiene Assist level: Supervision or verbal cues  Function - Air cabin crew transfer activity did not occur: Safety/medical concerns Toilet transfer assistive device: Elevated toilet seat/BSC over toilet, Grab bar (rollator) Assist level to toilet: Supervision or verbal cues Assist level from toilet: Supervision or verbal cues Assist level to bedside commode (at bedside): Moderate assist (Pt 50 - 74%/lift or lower) Assist level from bedside commode (at bedside): Moderate assist (Pt 50 - 74%/lift or lower)  Function - Chair/bed transfer Chair/bed transfer activity did not occur: Safety/medical concerns Chair/bed transfer method: Stand pivot Chair/bed transfer assist level: Touching or steadying assistance (Pt > 75%) Chair/bed transfer details: Verbal cues for safe use of DME/AE, Verbal cues for  technique  Function - Locomotion: Wheelchair Will patient use wheelchair at discharge?: No Type: Manual Max wheelchair distance: 100 Assist Level: Touching or steadying assistance (Pt > 75%) Assist Level: Touching or steadying assistance (Pt > 75%) Wheel 150 feet activity did not occur: Safety/medical concerns Turns around,maneuvers to table,bed, and toilet,negotiates 3% grade,maneuvers on rugs and over doorsills: No Function - Locomotion: Ambulation Assistive device:  (rollator) Max distance: 80 Assist level: Supervision or verbal cues Assist level: Supervision or verbal cues Assist level: Touching or steadying assistance (Pt > 75%) Walk 150 feet activity did not occur: Safety/medical concerns Walk 10 feet on uneven surfaces activity did not occur: Safety/medical concerns Assist level: Touching or steadying assistance (Pt > 75%) (ramp)  Function - Comprehension Comprehension: Auditory Comprehension assist level: Follows complex conversation/direction with no assist  Function - Expression Expression: Verbal Expression assist level: Expresses complex ideas:  With no assist  Function - Social Interaction Social Interaction assist level: Interacts appropriately with others - No medications needed.  Function - Problem Solving Problem solving assist level: Solves complex problems: Recognizes & self-corrects  Function - Memory Memory assist level: Assistive device: No helper Patient normally able to recall (first 3 days only): Location of own room, Staff names and faces, Current season, That he or she is in a hospital   Medical Problem List and Plan: 1. 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 DVT Prophylaxis/Anticoagulation: Coumadin per pharmacy protocol and no bridging required. Monitor for any bleeding episodes, INR therapeutic as of 10/11 3. Pain  Management: Oxycodone and flexeril as needed. Monitor with increased mobility 4. Lupus nephritis with end-stage renal disease. Continue hemodialysis as advised. Permanent access graft placement 01/04/2015.Nephro following, some post op pain around site, no drainage 5. Neuropsych: This patient is capable of making decisions on her own behalf. 6. Skin/Wound Care: Routine skin care 7. Fluids/Electrolytes/Nutrition: Strict I&O with follow-up chemistries, fair intake ~50% 8. Endocarditis of mitral valve. Unable to rule out infectious endocarditis as patient is immunosuppressed and leukopenic. Blood cultures negative. Plan vancomycin until 01/22/2015. 9. Hypertension. Norvasc 5 mg daily, Imdur or 30 mg daily, Toprol-XL 25 mg daily at bedtime. Monitor with increased mobility 10. Pseudomonas urinary tract infection. Ceftazadime completed 01/03/2015 11. Morbid obesity. Dietary follow-up 12. Sacral decubitus. Foam dressing change every 3 days as needed pressure release 13. Hyperlipidemia. Lipitor LOS (Days) 4 A FACE TO FACE EVALUATION WAS PERFORMED  KIRSTEINS,ANDREW E 01/09/2015, 6:48 AM

## 2015-01-09 NOTE — Progress Notes (Signed)
Patient ID: [name], female   DOB: [birth year], 39 y.o.   MRN: [id number]  [facility] Progress Note    Subjective:   No new complaints   Objective:   BP 140/72 mmHg  Pulse 105  Temp(Src) 98.7 F (37.1 C) (Oral)  Resp 16  Ht 5\' 3"  (1.6 m)  Wt 112 kg (246 lb 14.6 oz)  BMI 43.75 kg/m2  SpO2 98%  Intake/Output: I/O last 3 completed shifts: In: 635 [Blood:635] Out: 2950 [Other:2950]   Intake/Output this shift:    Weight change: -0.946 kg (-2 lb 1.4 oz)  Physical Exam: Gen:WD WN AAF in NAD NWG:NFAOZ Resp:cta HYQ:MVHQIO Ext:no edema, left thigh AVG +bruit  Labs: BMET  Recent Labs Lab 01/03/15 0500 01/04/15 0104 01/05/15 0405 01/05/15 2110 01/08/15 0500  NA 135 137 137 134* 132*  K 3.7 4.1 4.8 3.4* 3.8  CL 99* 101 101 96* 96*  CO2 25 26 26 29 26   GLUCOSE 75 78 105* 94 77  BUN 44* 25* 38* 15 31*  CREATININE 3.84* 3.20* 4.69* 3.02* 6.67*  ALBUMIN 2.2*  --  2.1* 2.1* 2.1*  CALCIUM 8.7* 8.7* 8.6* 7.7* 8.9  PHOS 3.6  --  4.8* 2.5 3.8   CBC  Recent Labs Lab 01/05/15 0405 01/05/15 2110 01/08/15 0500 01/09/15 0945  WBC 6.0 6.4 7.8 9.8  NEUTROABS  --  5.9  --   --   HGB 7.6* 8.1* 7.1* 10.1*  HCT 24.4* 25.5* 22.8* 31.2*  MCV 94.6 93.4 94.2 91.2  PLT 236 226 330 298    @IMGRELPRIORS @ Medications:    . sodium chloride   Intravenous Once  . aspirin EC  81 mg Oral Daily  . atorvastatin  20 mg Oral q1800  . bisacodyl  5 mg Oral BID  . calcium acetate  1,334 mg Oral TID WC  . cyclobenzaprine  10 mg Oral QHS  . [START ON 01/10/2015] darbepoetin (ARANESP) injection - DIALYSIS  200 mcg Intravenous Q Thu-HD  . feeding supplement (PRO-STAT SUGAR FREE 64)  30 mL Oral Daily  . hydroxychloroquine  400 mg Oral Daily  . isosorbide mononitrate  30 mg Oral Daily  . metoprolol succinate  25 mg Oral QHS  . multivitamin  1 tablet Oral QHS  . pantoprazole  20 mg Oral Daily  . vancomycin  1,250 mg Intravenous Q T,Th,Sa-HD  . Warfarin - Pharmacist  Dosing Inpatient   Does not apply [id number]     Assessment/ Plan:   1. CVA- doing well in rehab 2. ESRD- continue with HD qTTS (first HD session 12/07/14) 3. Anemia: on ESA with an acute drop and is symptomatic. 1. Guaiac stools and transfused 2 units of PRBC's with HD 01/08/15. 2. Recheck today showed appropriate bump in hgb 4. CKD-MBD: continue with binders 5. Nutrition:per primary svc 6. Vascular access- left femoral AVG placed 01/04/15 by Dr. Donnetta Hutching as well as right femoral tunneled HD cath 7. Hypertension:stable off amlodipine 8. Endocarditis- presumably Liebman-Sachs 9. SLE- biopsy on 8/25. This biopsy was complicated by a hematoma and it revealed again diffuse proliferative changes with crescents indicating that it could respond to immune suppressing therapy. She under went IV solumedrol, then high dose prednisone and was also on max dose cellcept. These agents have been weaned off as she has progressed to [place]. 10. SVT- on beta blocker Joene Gelder A 01/09/2015, 8:45 AM

## 2015-01-09 NOTE — Progress Notes (Addendum)
Pt. Verbalized that she is feeling nauseous.Zofran 4 mg. PO was given.Keep assessing pt's needs.

## 2015-01-09 NOTE — Progress Notes (Signed)
Physical Therapy Session Note  Patient Details  Name: [name] MRN: [id number] Date of Birth: 12/20/1975  Today's Date: 01/09/2015 PT Individual Time: 1000-1100 and 1300-1400 PT Individual Time Calculation (min): 60 min and 60 min (total 120 min)   Short Term Goals: Week 1:  PT Short Term Goal 1 (Week 1): Pt will be Min guard for transfers PT Short Term Goal 2 (Week 1): Pt will be SBA for all bed mobility PT Short Term Goal 3 (Week 1): Pt will be Min Guard for gait 150' PT Short Term Goal 4 (Week 1): Pt will perform stairs 12 with Min A  Skilled Therapeutic Interventions/Progress Updates:    1000-1100:Pt received seated in w/c, c/o LLE pain as described below and agreeable to treatment. Gait with rollator to gym x75' supervision. Seated and supine LE strengthening exercises performed; pt given handout with pictures of exercises and instructed in performing later in the evening to A with building LE strength for carryover into functional gait and mobility. Requires straight leg raise to be divided into sets of 5 due to extreme weakness. Supine >sit on edge of mat table required minA and verbal cues for arm positioning to A with rolling and pushing to sit. Sit <>stand and standing balance with pt matching cards to board in front of pt; performed to improve LE strength, dynamic standing balance with UE reaching, trunk rotation, as well as standing endurance. Pipe tree in standing to improve standing endurance, LE strength. Gait for 2 sets 4' each with supervision, occasional standing rest breaks due to fatigue, and seated rest break on rollator between trials. Pt returned to room and returned to bed; sit>supine with minA due to LLE pain. Remained supine in bed with all needs within reach at completion of session.   1300-1400: Pt received sleeping supine in bed, easy to arouse and agreeable to treatment. Supine>sit on EOB with bedrails and HOB elevated. Discussed importance of practicing from  flat bed without bedrails to prepare for d/c to home. Seated on EOB pt ate part of lunch; performed to improve seated activity tolerance. Bed transfer an bed mobility in simulated apartment with minA sit >supine, modA supine>sit. Pt states if she were at home she would call her mother or daughter to come assist her out of bed, or use the headboard to pull up on. Nustep x8 min with BUE/BLE to improve aerobic endurance, UE/LE strength for carryover into gait for functional distances. No increase in LLE pain or discomfort with activity. Gait during session from room to apartment and to therapy gym x50-75' per trial for 4 trials with rollator and supervision. Upon returning to room pt requests to use restroom and states she needs stool sample. Pt assisted to bathroom with supervision and rollator; pt remained seated on toilet at completion of session with instruction to pull call bell in bathroom when finished before getting up and NA alerted to pt status.   Therapy Documentation Precautions:  Precautions Precautions: Fall Precaution Comments: Pt's femoral cath is tunneled therefore she is able to get OOB. Restrictions Weight Bearing Restrictions: No General:   Pain: Pain Assessment Pain Assessment: 0-10 Pain Score: 2  Pain Type: Chronic pain Pain Location: Leg Pain Orientation: Left Pain Descriptors / Indicators: Aching;Sore Pain Onset: On-going Patients Stated Pain Goal: 1 Pain Intervention(s): Ambulation/increased activity;Repositioned Multiple Pain Sites: No   See Function Navigator for Current Functional Status.   Therapy/Group: Individual Therapy  [name] 01/09/2015, 11:11 AM

## 2015-01-10 ENCOUNTER — Encounter (HOSPITAL_COMMUNITY): Payer: Medicaid Other

## 2015-01-10 ENCOUNTER — Inpatient Hospital Stay (HOSPITAL_COMMUNITY): Payer: Medicaid Other | Admitting: Occupational Therapy

## 2015-01-10 ENCOUNTER — Inpatient Hospital Stay (HOSPITAL_COMMUNITY): Payer: Medicaid Other | Admitting: Physical Therapy

## 2015-01-10 DIAGNOSIS — G8918 Other acute postprocedural pain: Secondary | ICD-10-CM

## 2015-01-10 DIAGNOSIS — F4321 Adjustment disorder with depressed mood: Secondary | ICD-10-CM

## 2015-01-10 LAB — RENAL FUNCTION PANEL
Albumin: 1.9 g/dL — ABNORMAL LOW (ref 3.5–5.0)
Anion gap: 9 (ref 5–15)
BUN: 20 mg/dL (ref 6–20)
CHLORIDE: 97 mmol/L — AB (ref 101–111)
CO2: 29 mmol/L (ref 22–32)
CREATININE: 5.56 mg/dL — AB (ref 0.44–1.00)
Calcium: 8.3 mg/dL — ABNORMAL LOW (ref 8.9–10.3)
GFR calc non Af Amer: 9 mL/min — ABNORMAL LOW (ref >60)
GFR, EST AFRICAN AMERICAN: 10 mL/min — AB (ref >60)
GLUCOSE: 78 mg/dL (ref 65–99)
Phosphorus: 2.2 mg/dL — ABNORMAL LOW (ref 2.5–4.6)
Potassium: 3.9 mmol/L (ref 3.5–5.1)
Sodium: 135 mmol/L (ref 135–145)

## 2015-01-10 LAB — CBC
HCT: 29.7 % — ABNORMAL LOW (ref 36.0–46.0)
Hemoglobin: 9.6 g/dL — ABNORMAL LOW (ref 12.0–15.0)
MCH: 30.2 pg (ref 26.0–34.0)
MCHC: 32.3 g/dL (ref 30.0–36.0)
MCV: 93.4 fL (ref 78.0–100.0)
PLATELETS: 306 10*3/uL (ref 150–400)
RBC: 3.18 MIL/uL — AB (ref 3.87–5.11)
RDW: 17.1 % — ABNORMAL HIGH (ref 11.5–15.5)
WBC: 7.6 10*3/uL (ref 4.0–10.5)

## 2015-01-10 LAB — PROTIME-INR
INR: 2.59 — ABNORMAL HIGH (ref 0.00–1.49)
Prothrombin Time: 27.4 seconds — ABNORMAL HIGH (ref 11.6–15.2)

## 2015-01-10 MED ORDER — WARFARIN SODIUM 5 MG PO TABS
2.5000 mg | ORAL_TABLET | Freq: Once | ORAL | Status: AC
  Administered 2015-01-10: 2.5 mg via ORAL
  Filled 2015-01-10: qty 1

## 2015-01-10 MED ORDER — HEPARIN SODIUM (PORCINE) 1000 UNIT/ML DIALYSIS
4000.0000 [IU] | Freq: Once | INTRAMUSCULAR | Status: DC
  Filled 2015-01-10: qty 4

## 2015-01-10 MED ORDER — DARBEPOETIN ALFA 200 MCG/0.4ML IJ SOSY
PREFILLED_SYRINGE | INTRAMUSCULAR | Status: AC
  Administered 2015-01-10: 200 ug via INTRAVENOUS
  Filled 2015-01-10: qty 0.4

## 2015-01-10 MED ORDER — OXYCODONE-ACETAMINOPHEN 5-325 MG PO TABS
ORAL_TABLET | ORAL | Status: AC
  Filled 2015-01-10: qty 1

## 2015-01-10 NOTE — Progress Notes (Signed)
Physical Therapy Session Note  Patient Details  Name: [name] MRN: [id number] Date of Birth: 1975-06-20  Today's Date: 01/10/2015 PT Individual Time: 1100-1200 and 1300-1410 PT Individual Time Calculation (min): 60 min and 70 min (total 130 min)   Short Term Goals: Week 1:  PT Short Term Goal 1 (Week 1): Pt will be Min guard for transfers PT Short Term Goal 2 (Week 1): Pt will be SBA for all bed mobility PT Short Term Goal 3 (Week 1): Pt will be Min Guard for gait 150' PT Short Term Goal 4 (Week 1): Pt will perform stairs 12 with Min A  Skilled Therapeutic Interventions/Progress Updates:    1100-1200: Pt received supine in bed; no c/o pain and agreeable to treatment. Supine>sit with HOB elevated and bedrails on air mattress with supervision and increased time. Sit >stand and gait to simulated apartment with rollator and supervision. Sit >supine in bed with minA to assist with lifting LLE into bed; rolling and supine >sit with close supervision and verbal cues for technique. Gait for 2 trials of 230' and 210' with rollator and 1-2 short standing rest breaks during each trial however extended seated rest break between trials due to fatigue. Sit <>stand for 3 sets 5 reps without UEs from elevated; when table lowered pt becomes more fearful and hesitant to perform and requests to use UEs on table/rollator. Standing balance activity with alternating toe taps to 2" and 5" step; requires CGA with 2" step, min/modA for 5". Gait to return to room x100' with rollator and supervision. Pt remained seated in w/c with all needs within reach at completion of session.  1300-1410: Pt received seated in w/c with no c/o pain and agreeable to treatment. Gait with rollator to therapy gym x75' with supervision. Nustep x10 min with BLE/BUE on level 3 with several short rest breaks due to fatigue. Stairs 1 set 8 stairs 4-inch height with 2 handrails and close S. Standing balance with alternating toe taps to  numbered targets, minA for balance and pt easily fatigued. Gait in community setting including uneven surfaces, incline/declines. Several seated rest breaks due to fatigue, and pt reported feeling decreased control of B knees on decline, likely due to poor eccentric strength in B quads. Pt returned to room and remained supine in bed with all needs in reach at completion of session.   Therapy Documentation Precautions:  Precautions Precautions: Fall Precaution Comments: Pt's femoral cath is tunneled therefore she is able to get OOB. Restrictions Weight Bearing Restrictions: No Pain: Pain Assessment Pain Assessment: No/denies pain Pain Score: 0-No pain Pain Type: Surgical pain Pain Location: Leg Pain Orientation: Left Pain Descriptors / Indicators: Aching Pain Frequency: Intermittent Pain Onset: On-going Patients Stated Pain Goal: 2 Pain Intervention(s): Medication (See eMAR);Repositioned Multiple Pain Sites: No   See Function Navigator for Current Functional Status.   Therapy/Group: Individual Therapy  Luberta Mutter 01/10/2015, 12:09 PM

## 2015-01-10 NOTE — Progress Notes (Addendum)
Patient ID: [name], female   DOB: 1975/04/27, 39 y.o.   MRN: [id number]  [facility] Progress Note    Subjective:   C/o left thigh pain   Objective:   BP 142/73 mmHg  Pulse 112  Temp(Src) 99.4 F (37.4 C) (Oral)  Resp 18  Ht 5\' 3"  (1.6 m)  Wt 112 kg (246 lb 14.6 oz)  BMI 43.75 kg/m2  SpO2 98%  Intake/Output: I/O last 3 completed shifts: In: 629 [P.O.:240; I.V.:20; Blood:335] Out: 2950 [Other:2950]   Intake/Output this shift:    Weight change:   Physical Exam: Gen:WD WN AAF in NAD UTM:LYYTK, no rub Resp:cta PTW:SFKCLE Ext:1+ edema, left thigh AVG +bruit, no thrill with some ecchymosis at medial thigh, no fluctuance/warmth/drainage, steristrips intact  Labs: BMET  Recent Labs Lab 01/04/15 0104 01/05/15 0405 01/05/15 2110 01/08/15 0500 01/10/15 0455  NA 137 137 134* 132* 135  K 4.1 4.8 3.4* 3.8 3.9  CL 101 101 96* 96* 97*  CO2 26 26 29 26 29   GLUCOSE 78 105* 94 77 78  BUN 25* 38* 15 31* 20  CREATININE 3.20* 4.69* 3.02* 6.67* 5.56*  ALBUMIN  --  2.1* 2.1* 2.1* 1.9*  CALCIUM 8.7* 8.6* 7.7* 8.9 8.3*  PHOS  --  4.8* 2.5 3.8 2.2*   CBC  Recent Labs Lab 01/05/15 2110 01/08/15 0500 01/09/15 0945 01/10/15 0445  WBC 6.4 7.8 9.8 7.6  NEUTROABS 5.9  --   --   --   HGB 8.1* 7.1* 10.1* 9.6*  HCT 25.5* 22.8* 31.2* 29.7*  MCV 93.4 94.2 91.2 93.4  PLT 226 330 298 306    @IMGRELPRIORS @ Medications:    . sodium chloride   Intravenous Once  . atorvastatin  20 mg Oral q1800  . bisacodyl  5 mg Oral BID  . calcium acetate  1,334 mg Oral TID WC  . cyclobenzaprine  10 mg Oral QHS  . darbepoetin (ARANESP) injection - DIALYSIS  200 mcg Intravenous Q Thu-HD  . feeding supplement (PRO-STAT SUGAR FREE 64)  30 mL Oral Daily  . hydroxychloroquine  400 mg Oral Daily  . isosorbide mononitrate  30 mg Oral Daily  . metoprolol succinate  25 mg Oral QHS  . multivitamin  1 tablet Oral QHS  . pantoprazole  20 mg Oral Daily  . vancomycin  1,250 mg  Intravenous Q T,Th,Sa-HD  . warfarin  5 mg Oral q1800  . Warfarin - Pharmacist Dosing Inpatient   Does not apply [id number]     Assessment/ Plan:   1. CVA- doing well in rehab 2. ESRD- continue with HD qTTS (first HD session 12/07/14) 3. Anemia: on ESA with an acute drop and is symptomatic. 1. Guaiac + stools and will need GI evaluation as she is on coumadin  2. transfused 2 units of PRBC's with HD 01/08/15. 3. Recheck showed appropriate bump in hgb but trending down again. 4. Continue to follow and transfuse prn 4. CKD-MBD: discontinue binders due to hypophosphatemia and nausea with phoslo 5. Nutrition:per primary svc 6. Vascular access- left femoral AVG placed 01/04/15 by Dr. Donnetta Hutching as well as right femoral tunneled HD cath 7. Hypertension:stable off amlodipine 8. Endocarditis- presumably Liebman-Sachs 9. SLE- biopsy on 8/25. This biopsy was complicated by Chen hematoma and it revealed again diffuse proliferative changes with crescents indicating that it could respond to immune suppressing therapy. She under went IV solumedrol, then high dose prednisone and was also on max dose cellcept. These agents have been weaned off as she has  progressed to HD-dependence. 10. SVT- on beta blocker  [name] 01/10/2015, 8:01 AM

## 2015-01-10 NOTE — Progress Notes (Signed)
Initial Nutrition Assessment  DOCUMENTATION CODES:   Morbid obesity  INTERVENTION:   Continue 30 ml Prostat po once daily, each supplement provides 100 kcal and 15 grams of protein.  Provide nourishment snacks. (Ordered).  Encourage adequate PO intake.   Renal diet education handout given.   NUTRITION DIAGNOSIS:   Increased nutrient needs related to chronic illness as evidenced by estimated needs.  GOAL:   Patient will meet greater than or equal to 90% of their needs  MONITOR:   PO intake, Supplement acceptance, Weight trends, Labs, I & O's  REASON FOR ASSESSMENT:   Consult Assessment of nutrition requirement/status  ASSESSMENT:   39 y.o. right hand female with history of fibromyalgia, SVT status post ablation, chronic anemia, hypertension, SLE with recently diagnosed lupus nephritis. Underwent HD catheter placement per radiology services 12/06/2014 and dialysis initiated with permanent dialysis catheter 12/11/2014 per Dr. Trula Slade that required ligation of AV graft due to ischemic steal of left arm 12/19/2014 and later with left thigh graft placement for HD 01/04/2015 On 12/21/2014 patient became verbally less responsive left facial droop right eye deviation. MRI of the brain showed right MCA infarct.  Meal completion has been varied from 2-100% with meal completion of 2% at lunch time today. Pt is unavailable during time of visit. Pt familiar to this RD due to recent acute hospitalization, where pt reports having a decreased appetite however eating fine PTA. Weight has been stable per weight records. Pt currently has Prostat ordered and has been consuming them. RD to continue with current orders. RD to additionally order nourishment snacks to aid in caloric and protein needs. Unable to perform nutrition focused physical exam at this time.   RD additionally consulted for diet education. Renal diet education handout placed at patient bedside. RD to give education during next  visit.   Labs and medications reviewed.   Diet Order:  Diet renal with fluid restriction Fluid restriction:: 1200 mL Fluid; Room service appropriate?: Yes; Fluid consistency:: Thin  Skin:  Wound (see comment) (Stage II pressure ulcer on buttocks)  Last BM:  10/12  Height:   Ht Readings from Last 1 Encounters:  01/05/15 5\' 3"  (1.6 m)    Weight:   Wt Readings from Last 1 Encounters:  01/09/15 246 lb 14.6 oz (112 kg)    Ideal Body Weight:  52.27 kg  BMI:  Body mass index is 43.75 kg/(m^2).  Estimated Nutritional Needs:   Kcal:  1610-9604  Protein:  110-125 grams  Fluid:  1.2 L/day  EDUCATION NEEDS:   Education needs addressed  Corrin Parker, MS, RD, LDN Pager # [phone] After hours/ weekend pager # [phone]

## 2015-01-10 NOTE — Progress Notes (Signed)
Social Work Patient ID: [name], female   DOB: June 06, 1975, 39 y.o.   MRN: [id number] Met with pt and Mom to inform team conference goals-mod/i-supervision level and discharge target 10/20. Pt is disappointed and wanted to go home sooner, for her children. She realizes she needs to focus upon herself and getting better so she will be able to provide care to children. Neuro-psyh saw today and she feels this was helpful. Pt seems brighter today over yesterday, smiling and dressed nicely. Have faxed in SCAT application and will work toward discharge next Thursday.

## 2015-01-10 NOTE — Progress Notes (Signed)
Subjective/Complaints:  Left thigh tenderness but no pain interfering with therapy or sleep  ROS- neg SOB, Constipation, pain  Objective: Vital Signs: Blood pressure 142/73, pulse 112, temperature 99.4 F (37.4 C), temperature source Oral, resp. rate 18, height 5' 3"  (1.6 m), weight 112 kg (246 lb 14.6 oz), SpO2 98 %. No results found. Results for orders placed or performed during the hospital encounter of 01/05/15 (from the past 72 hour(s))  Protime-INR     Status: Abnormal   Collection Time: 01/08/15  5:00 AM  Result Value Ref Range   Prothrombin Time 23.0 (H) 11.6 - 15.2 seconds   INR 2.05 (H) 0.00 - 1.49  CBC     Status: Abnormal   Collection Time: 01/08/15  5:00 AM  Result Value Ref Range   WBC 7.8 4.0 - 10.5 K/uL   RBC 2.42 (L) 3.87 - 5.11 MIL/uL   Hemoglobin 7.1 (L) 12.0 - 15.0 g/dL   HCT 22.8 (L) 36.0 - 46.0 %   MCV 94.2 78.0 - 100.0 fL   MCH 29.3 26.0 - 34.0 pg   MCHC 31.1 30.0 - 36.0 g/dL   RDW 16.0 (H) 11.5 - 15.5 %   Platelets 330 150 - 400 K/uL  Renal function panel     Status: Abnormal   Collection Time: 01/08/15  5:00 AM  Result Value Ref Range   Sodium 132 (L) 135 - 145 mmol/L   Potassium 3.8 3.5 - 5.1 mmol/L   Chloride 96 (L) 101 - 111 mmol/L   CO2 26 22 - 32 mmol/L   Glucose, Bld 77 65 - 99 mg/dL   BUN 31 (H) 6 - 20 mg/dL   Creatinine, Ser 6.67 (H) 0.44 - 1.00 mg/dL   Calcium 8.9 8.9 - 10.3 mg/dL   Phosphorus 3.8 2.5 - 4.6 mg/dL   Albumin 2.1 (L) 3.5 - 5.0 g/dL   GFR calc non Af Amer 7 (L) >60 mL/min   GFR calc Af Amer 8 (L) >60 mL/min    Comment: (NOTE) The eGFR has been calculated using the CKD EPI equation. This calculation has not been validated in all clinical situations. eGFR's persistently <60 mL/min signify possible Chronic Kidney Disease.    Anion gap 10 5 - 15  Prepare RBC     Status: None   Collection Time: 01/08/15  9:19 AM  Result Value Ref Range   Order Confirmation ORDER PROCESSED BY BLOOD BANK   Type and screen [place]     Status: None   Collection Time: 01/08/15  3:40 PM  Result Value Ref Range   ABO/RH(D) A POS    Antibody Screen NEG    Sample Expiration 01/11/2015    Unit Number [id number]    Blood Component Type RED CELLS,LR    Unit division 00    Status of Unit ISSUED,FINAL    Transfusion Status OK TO TRANSFUSE    Crossmatch Result Compatible    Unit Number [id number]    Blood Component Type RBC, LR IRR    Unit division 00    Status of Unit ISSUED,FINAL    Transfusion Status OK TO TRANSFUSE    Crossmatch Result Compatible   Protime-INR     Status: Abnormal   Collection Time: 01/09/15  5:05 AM  Result Value Ref Range   Prothrombin Time 23.7 (H) 11.6 - 15.2 seconds   INR 2.14 (H) 0.00 - 1.49  CBC     Status: Abnormal   Collection Time: 01/09/15  9:45  AM  Result Value Ref Range   WBC 9.8 4.0 - 10.5 K/uL   RBC 3.42 (L) 3.87 - 5.11 MIL/uL   Hemoglobin 10.1 (L) 12.0 - 15.0 g/dL    Comment: POST TRANSFUSION SPECIMEN   HCT 31.2 (L) 36.0 - 46.0 %   MCV 91.2 78.0 - 100.0 fL   MCH 29.5 26.0 - 34.0 pg   MCHC 32.4 30.0 - 36.0 g/dL   RDW 17.4 (H) 11.5 - 15.5 %   Platelets 298 150 - 400 K/uL  Occult blood card to lab, stool RN will collect     Status: Abnormal   Collection Time: 01/09/15  2:52 PM  Result Value Ref Range   Fecal Occult Bld POSITIVE (A) NEGATIVE  Protime-INR     Status: Abnormal   Collection Time: 01/10/15  4:45 AM  Result Value Ref Range   Prothrombin Time 27.4 (H) 11.6 - 15.2 seconds   INR 2.59 (H) 0.00 - 1.49  CBC     Status: Abnormal   Collection Time: 01/10/15  4:45 AM  Result Value Ref Range   WBC 7.6 4.0 - 10.5 K/uL   RBC 3.18 (L) 3.87 - 5.11 MIL/uL   Hemoglobin 9.6 (L) 12.0 - 15.0 g/dL   HCT 29.7 (L) 36.0 - 46.0 %   MCV 93.4 78.0 - 100.0 fL   MCH 30.2 26.0 - 34.0 pg   MCHC 32.3 30.0 - 36.0 g/dL   RDW 17.1 (H) 11.5 - 15.5 %   Platelets 306 150 - 400 K/uL  Renal function panel     Status: Abnormal   Collection Time: 01/10/15  4:55 AM   Result Value Ref Range   Sodium 135 135 - 145 mmol/L   Potassium 3.9 3.5 - 5.1 mmol/L   Chloride 97 (L) 101 - 111 mmol/L   CO2 29 22 - 32 mmol/L   Glucose, Bld 78 65 - 99 mg/dL   BUN 20 6 - 20 mg/dL   Creatinine, Ser 5.56 (H) 0.44 - 1.00 mg/dL   Calcium 8.3 (L) 8.9 - 10.3 mg/dL   Phosphorus 2.2 (L) 2.5 - 4.6 mg/dL   Albumin 1.9 (L) 3.5 - 5.0 g/dL   GFR calc non Af Amer 9 (L) >60 mL/min   GFR calc Af Amer 10 (L) >60 mL/min    Comment: (NOTE) The eGFR has been calculated using the CKD EPI equation. This calculation has not been validated in all clinical situations. eGFR's persistently <60 mL/min signify possible Chronic Kidney Disease.    Anion gap 9 5 - 15     HEENT: normal Cardio: RRR and no murmurs Resp: CTA B/L and unlabored GI: BS positive and non tender Extremity:  Pulses positive and No Edema, bruising Left thigh around surgical site, tenderness lateral aspect of graft, good bruit Skin:   Intact Neuro: Alert/Oriented, Flat and Abnormal Motor 4-/5 LUE and LLE, 5- in RUE and RLE Musc/Skel:  Other no pain with AROM in UE or LEs Gen NAD   Assessment/Plan: 1. Functional deficits secondary to Right MCA which require 3+ hours per day of interdisciplinary therapy in a comprehensive inpatient rehab setting. Physiatrist is providing close team supervision and 24 hour management of active medical problems listed below. Physiatrist and rehab team continue to assess barriers to discharge/monitor patient progress toward functional and medical goals. FIM: Function - Bathing Position: Wheelchair/chair at sink Body parts bathed by patient: Right arm, Left arm, Chest, Abdomen, Front perineal area, Buttocks, Right upper leg, Left upper leg, Right lower  leg, Left lower leg Body parts bathed by helper: Back Assist Level: Touching or steadying assistance(Pt > 75%)  Function- Upper Body Dressing/Undressing What is the patient wearing?: Bra, Pull over shirt/dress Bra - Perfomed by  patient: Thread/unthread right bra strap, Thread/unthread left bra strap, Hook/unhook bra (pull down sports bra) Pull over shirt/dress - Perfomed by patient: Thread/unthread right sleeve, Thread/unthread left sleeve, Put head through opening, Pull shirt over trunk Assist Level: Set up, Supervision or verbal cues Set up : To obtain clothing/put away Function - Lower Body Dressing/Undressing What is the patient wearing?: Underwear, Socks Transport planner) Position: Wheelchair/chair at [employer] - Performed by patient: Thread/unthread right underwear leg, Thread/unthread left underwear leg, Pull underwear up/down Underwear - Performed by helper: Thread/unthread right underwear leg, Thread/unthread left underwear leg Socks - Performed by helper: Don/doff right sock, Don/doff left sock Shoes - Performed by helper: Don/doff right shoe, Don/doff left shoe, Fasten right, Fasten left Assist for lower body dressing: Touching or steadying assistance (Pt > 75%)  Function - Toileting Toileting activity did not occur: Safety/medical concerns Toileting steps completed by patient: Adjust clothing prior to toileting, Performs perineal hygiene, Adjust clothing after toileting Toileting steps completed by helper: Performs perineal hygiene Assist level: Supervision or verbal cues  Function - Air cabin crew transfer activity did not occur: Safety/medical concerns Toilet transfer assistive device: Elevated toilet seat/BSC over toilet, Grab bar Assist level to toilet: Touching or steadying assistance (Pt > 75%) Assist level from toilet: Touching or steadying assistance (Pt > 75%) Assist level to bedside commode (at bedside): Moderate assist (Pt 50 - 74%/lift or lower) Assist level from bedside commode (at bedside): Moderate assist (Pt 50 - 74%/lift or lower)  Function - Chair/bed transfer Chair/bed transfer activity did not occur: Safety/medical concerns Chair/bed transfer method: Stand pivot Chair/bed  transfer assist level: Supervision or verbal cues Chair/bed transfer assistive device:  (rollator) Chair/bed transfer details: Verbal cues for safe use of DME/AE, Verbal cues for technique  Function - Locomotion: Wheelchair Will patient use wheelchair at discharge?: No Type: Manual Max wheelchair distance: 100 Assist Level: Touching or steadying assistance (Pt > 75%) Assist Level: Touching or steadying assistance (Pt > 75%) Wheel 150 feet activity did not occur: Safety/medical concerns Turns around,maneuvers to table,bed, and toilet,negotiates 3% grade,maneuvers on rugs and over doorsills: No Function - Locomotion: Ambulation Assistive device:  (rollator) Max distance: 100 Assist level: Supervision or verbal cues Assist level: Supervision or verbal cues Assist level: Supervision or verbal cues Walk 150 feet activity did not occur: Safety/medical concerns Walk 10 feet on uneven surfaces activity did not occur: Safety/medical concerns Assist level: Touching or steadying assistance (Pt > 75%) (ramp)  Function - Comprehension Comprehension: Auditory Comprehension assist level: Follows complex conversation/direction with no assist  Function - Expression Expression: Verbal Expression assist level: Expresses complex ideas: With no assist  Function - Social Interaction Social Interaction assist level: Interacts appropriately with others - No medications needed.  Function - Problem Solving Problem solving assist level: Solves complex problems: Recognizes & self-corrects  Function - Memory Memory assist level: Complete Independence: No helper Patient normally able to recall (first 3 days only): Location of own room, Staff names and faces, Current season, That he or she is in a hospital   Medical Problem List and Plan: 1. Functional deficits secondary to right MCA infarct with history of lupus/multi-medical, has left LE>UE weakness, some weakness may be post op pain inhibition post  op 2. DVT Prophylaxis/Anticoagulation: Coumadin per pharmacy protocol and no bridging  required. Monitor for any bleeding episodes, INR therapeutic as of 10/11 3. Pain Management: Oxycodone and flexeril as needed. Monitor with increased mobility 4. Lupus nephritis with end-stage renal disease. Continue hemodialysis as advised. Permanent access graft placement 01/04/2015.Nephro following, some post op pain around graft, WBC normal 10/13 5. Neuropsych: This patient is capable of making decisions on her own behalf. 6. Skin/Wound Care: Routine skin care 7. Fluids/Electrolytes/Nutrition: Strict I&O with follow-up chemistries, fair intake ~50% 8. Endocarditis of mitral valve. Unable to rule out infectious endocarditis as patient is immunosuppressed and leukopenic. Blood cultures negative. Plan vancomycin until 01/22/2015. 9. Hypertension. Norvasc 5 mg daily, Imdur or 30 mg daily, Toprol-XL 25 mg daily at bedtime. Monitor with increased mobility, 142/73 10. Pseudomonas urinary tract infection. Ceftazadime completed 01/03/2015, will repeat UA givne low grade temp 11. Morbid obesity. Dietary follow-up 12. Sacral decubitus. Foam dressing change every 3 days as needed pressure release 13. Hyperlipidemia. Lipitor LOS (Days) 5 A FACE TO FACE EVALUATION WAS PERFORMED  KIRSTEINS,ANDREW E 01/10/2015, 7:08 AM

## 2015-01-10 NOTE — Progress Notes (Signed)
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 Food Swelling    Red peppers    Patient Measurements: Height: 5\' 3"  (160 cm) Weight: 246 lb 14.6 oz (112 kg) IBW/kg (Calculated) : 52.4   Vital Signs: Temp: 99.4 F (37.4 C) (10/13 0452) Temp Source: Oral (10/13 0452) BP: 142/73 mmHg (10/13 0452) Pulse Rate: 112 (10/13 0452)  Labs:  Recent Labs  01/08/15 0500 01/09/15 0505 01/09/15 0945 01/10/15 0445 01/10/15 0455  HGB 7.1*  --  10.1* 9.6*  --   HCT 22.8*  --  31.2* 29.7*  --   PLT 330  --  298 306  --   LABPROT 23.0* 23.7*  --  27.4*  --   INR 2.05* 2.14*  --  2.59*  --   CREATININE 6.67*  --   --   --  5.56*    Estimated Creatinine Clearance: 16.5 mL/min (by C-G formula based on Cr of 5.56).   Assessment: 39 yoF with R MCA stroke in setting of possible Libman Sacks endocarditis, currently on coumadin, INR 2.14 > 2.59, s/p transfusion on 1011, Hgb 7.1 > 10.1 > 9.6. Guaiac stool Positive. Will need GI evaluation? No active bleeding noted.  Goal of Therapy:  Monitor platelets by anticoagulation protocol: Yes   Plan:  -Wrfarin 2.5 mg po x1 tonight -Daily INR, CBC q72h -Monitor S/Sx of bleeding, f/u GI workup.  Maryanna Shape, PharmD, BCPS  Clinical Pharmacist  Pager: [phone]   01/10/2015 11:17 AM

## 2015-01-10 NOTE — Progress Notes (Signed)
Occupational Therapy Session Note  Patient Details  Name: Kateline Kinkade MRN: [id number] Date of Birth: 1976-02-27  Today's Date: 01/10/2015 OT Individual Time: 0845-1000 OT Individual Time Calculation (min): 75 min    Short Term Goals: Week 1:  OT Short Term Goal 1 (Week 1): Pt. will bathe with supervision OT Short Term Goal 2 (Week 1): Pt will dress with supervision OT Short Term Goal 3 (Week 1): Pt will perform transfer to toilet with supervision OT Short Term Goal 4 (Week 1): Pt will do tub transfer with bench at min assist level  Skilled Therapeutic Interventions/Progress Updates:    ADL retraining with focus on functional mobility and increased independence with self-care tasks.  Completed bed mobility from flat bed with encouragement to attempt to simulate home environment.  Pt ambulated to sink with Rollator and completed oral hygiene in standing, tolerating standing 2-3 mins.  Educated on use of reacher to assist with doffing socks, utilized step stool to assist with LB dressing.  Educated on hemi-dressing technique with pt demonstrating improvements with donning underwear LLE first.  Pt required increased time for self-care this session, however with increased activity tolerance and independence.  Pt continues to require verbal cues for locking w/c and Rollator brakes prior to sit > stand.  Therapy Documentation Precautions:  Precautions Precautions: Fall Precaution Comments: Pt's femoral cath is tunneled therefore she is able to get OOB. Restrictions Weight Bearing Restrictions: No Pain: Pain Assessment Pain Assessment: No/denies pain Pain Score: 0-No pain Pain Type: Surgical pain Pain Location: Leg Pain Orientation: Left Pain Descriptors / Indicators: Aching Pain Frequency: Intermittent Pain Onset: On-going Patients Stated Pain Goal: 2 Pain Intervention(s): Medication (See eMAR);Repositioned Multiple Pain Sites: No  See Function Navigator for Current Functional  Status.   Therapy/Group: Individual Therapy  Simonne Come 01/10/2015, 12:10 PM

## 2015-01-11 ENCOUNTER — Inpatient Hospital Stay (HOSPITAL_COMMUNITY): Payer: Medicaid Other | Admitting: Occupational Therapy

## 2015-01-11 ENCOUNTER — Inpatient Hospital Stay (HOSPITAL_COMMUNITY): Payer: Medicaid Other

## 2015-01-11 ENCOUNTER — Inpatient Hospital Stay (HOSPITAL_COMMUNITY): Payer: Medicaid Other | Admitting: Physical Therapy

## 2015-01-11 DIAGNOSIS — F4321 Adjustment disorder with depressed mood: Secondary | ICD-10-CM | POA: Diagnosis present

## 2015-01-11 LAB — CBC
HCT: 29.7 % — ABNORMAL LOW (ref 36.0–46.0)
Hemoglobin: 9.5 g/dL — ABNORMAL LOW (ref 12.0–15.0)
MCH: 30.2 pg (ref 26.0–34.0)
MCHC: 32 g/dL (ref 30.0–36.0)
MCV: 94.3 fL (ref 78.0–100.0)
Platelets: 289 10*3/uL (ref 150–400)
RBC: 3.15 MIL/uL — ABNORMAL LOW (ref 3.87–5.11)
RDW: 17.3 % — AB (ref 11.5–15.5)
WBC: 8.7 10*3/uL (ref 4.0–10.5)

## 2015-01-11 LAB — URINALYSIS, ROUTINE W REFLEX MICROSCOPIC
GLUCOSE, UA: NEGATIVE mg/dL
Hgb urine dipstick: NEGATIVE
Ketones, ur: NEGATIVE mg/dL
NITRITE: NEGATIVE
Specific Gravity, Urine: 1.022 (ref 1.005–1.030)
Urobilinogen, UA: 0.2 mg/dL (ref 0.0–1.0)
pH: 6.5 (ref 5.0–8.0)

## 2015-01-11 LAB — RENAL FUNCTION PANEL
ANION GAP: 10 (ref 5–15)
Albumin: 2.1 g/dL — ABNORMAL LOW (ref 3.5–5.0)
BUN: 13 mg/dL (ref 6–20)
CHLORIDE: 98 mmol/L — AB (ref 101–111)
CO2: 28 mmol/L (ref 22–32)
Calcium: 7.8 mg/dL — ABNORMAL LOW (ref 8.9–10.3)
Creatinine, Ser: 4.26 mg/dL — ABNORMAL HIGH (ref 0.44–1.00)
GFR calc Af Amer: 14 mL/min — ABNORMAL LOW (ref >60)
GFR, EST NON AFRICAN AMERICAN: 12 mL/min — AB (ref >60)
Glucose, Bld: 76 mg/dL (ref 65–99)
POTASSIUM: 4.3 mmol/L (ref 3.5–5.1)
Phosphorus: 2.3 mg/dL — ABNORMAL LOW (ref 2.5–4.6)
Sodium: 136 mmol/L (ref 135–145)

## 2015-01-11 LAB — PROTIME-INR
INR: 3.03 — ABNORMAL HIGH (ref 0.00–1.49)
Prothrombin Time: 30.8 seconds — ABNORMAL HIGH (ref 11.6–15.2)

## 2015-01-11 LAB — URINE MICROSCOPIC-ADD ON

## 2015-01-11 MED ORDER — PIPERACILLIN-TAZOBACTAM IN DEX 2-0.25 GM/50ML IV SOLN
2.2500 g | Freq: Three times a day (TID) | INTRAVENOUS | Status: DC
  Administered 2015-01-11 – 2015-01-16 (×14): 2.25 g via INTRAVENOUS
  Filled 2015-01-11 (×17): qty 50

## 2015-01-11 MED ORDER — FLUCONAZOLE 100 MG PO TABS: 100.0000 mg | ORAL_TABLET | Freq: Every day | ORAL | Status: DC

## 2015-01-11 MED ORDER — WARFARIN 0.5 MG HALF TABLET
0.5000 mg | ORAL_TABLET | Freq: Once | ORAL | Status: AC
  Administered 2015-01-11: 0.5 mg via ORAL
  Filled 2015-01-11: qty 1

## 2015-01-11 MED ORDER — FLUCONAZOLE 100 MG PO TABS
100.0000 mg | ORAL_TABLET | Freq: Every day | ORAL | Status: AC
  Administered 2015-01-11: 100 mg via ORAL
  Filled 2015-01-11: qty 1

## 2015-01-11 NOTE — Progress Notes (Signed)
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 Food Swelling    Red peppers    Patient Measurements: Height: 5\' 3"  (160 cm) Weight: 181 lb 14.1 oz (82.5 kg) IBW/kg (Calculated) : 52.4   Vital Signs: Temp: 100 F (37.8 C) (10/14 0456) Temp Source: Oral (10/14 0456) BP: 142/74 mmHg (10/14 0456) Pulse Rate: 111 (10/14 0456)  Labs:  Recent Labs  01/09/15 0505  01/09/15 0945 01/10/15 0445 01/10/15 0455 01/11/15 0515  HGB  --   < > 10.1* 9.6*  --  9.5*  HCT  --   --  31.2* 29.7*  --  29.7*  PLT  --   --  298 306  --  289  LABPROT 23.7*  --   --  27.4*  --  30.8*  INR 2.14*  --   --  2.59*  --  3.03*  CREATININE  --   --   --   --  5.56* 4.26*  < > = values in this interval not displayed.  Estimated Creatinine Clearance: 18.2 mL/min (by C-G formula based on Cr of 4.26).   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 Guaiac stool Positive. Will need GI evaluation? No active bleeding noted. Discussed with PA yesterday, will continue coumadin for now.  ID: Pt is on Vancomycin for endocarditis. WBC 8.7K, afebrile. TEE 9/27 showed MV vegetation consistent with endocarditis, cannot r/o infectious endocarditis, so continue vanc though 10/25. He also has ESRD, on HD TTS, tolerated HD on 10/11, 10/13, next HD 8/15.  10/8 preHD vanc = 7 -> gave 2g after HD on 10/8.   Ceftriaxone 9/5>>9/6, 9/7>>9/8  Cipro 9/6>>9/7  Cephalexin 9/8>>9/12  Cefuroxime>9/26 x 1 for AVG surgery  Vancomycin 9/27>10/1, 10/3>> want to continue for 3 more weeks to 10/25  Cefazolin 10/1>>10/3  Ceftazidime 10/3>>10/6   Urine 9/5>> > K pneumo  susceptible to CTX/Cefazolin  BC x 2 9/26> ngF  BC x 2 9/27> ngF  Urine 10/1>> pseudomonas (I to cipro)   Goal of Therapy:  INR 2-3 Pre-HD vancomycin level = 15-25 Monitor platelets by anticoagulation protocol: Yes   Plan:  -Wrfarin 0.5 mg po x1 tonight -Daily INR, CBC q72h -Monitor S/Sx of bleeding. -Continue vancomycin 1250 mg IV after HD.  -Will consider recheck level Saturday or Tuesday.  Maryanna Shape, PharmD, BCPS  Clinical Pharmacist  Pager: [phone]  01/11/2015 11:03 AM

## 2015-01-11 NOTE — Progress Notes (Signed)
Patient ID: [name], female   DOB: Aug 26, 1975, 39 y.o.   MRN: [id number]  [facility] Progress Note    Subjective:   Reports a lot of nausea this am and unable to participate with PT   Objective:   BP 142/74 mmHg  Pulse 111  Temp(Src) 100 F (37.8 C) (Oral)  Resp 20  Ht 5\' 3"  (1.6 m)  Wt 82.5 kg (181 lb 14.1 oz)  BMI 32.23 kg/m2  SpO2 96%  Intake/Output: I/O last 3 completed shifts: In: 45 [P.O.:240; I.V.:40] Out: 2250 [Other:2250]   Intake/Output this shift:  Total I/O In: 240 [P.O.:240] Out: -  Weight change:   Physical Exam: Gen:WD [name] in mild distress GUY:QIHKV at 111 Resp:cta Abd:+BS, soft, NT Ext:L thigh AVG +T/B and tender to palpation, no drainage/fluctuance  Labs: BMET  Recent Labs Lab 01/05/15 0405 01/05/15 2110 01/08/15 0500 01/10/15 0455 01/11/15 0515  NA 137 134* 132* 135 136  K 4.8 3.4* 3.8 3.9 4.3  CL 101 96* 96* 97* 98*  CO2 26 29 26 29 28   GLUCOSE 105* 94 77 78 76  BUN 38* 15 31* 20 13  CREATININE 4.69* 3.02* 6.67* 5.56* 4.26*  ALBUMIN 2.1* 2.1* 2.1* 1.9* 2.1*  CALCIUM 8.6* 7.7* 8.9 8.3* 7.8*  PHOS 4.8* 2.5 3.8 2.2* 2.3*   CBC  Recent Labs Lab 01/05/15 2110 01/08/15 0500 01/09/15 0945 01/10/15 0445 01/11/15 0515  WBC 6.4 7.8 9.8 7.6 8.7  NEUTROABS 5.9  --   --   --   --   HGB 8.1* 7.1* 10.1* 9.6* 9.5*  HCT 25.5* 22.8* 31.2* 29.7* 29.7*  MCV 93.4 94.2 91.2 93.4 94.3  PLT 226 330 298 306 289    @IMGRELPRIORS @ Medications:    . sodium chloride   Intravenous Once  . atorvastatin  20 mg Oral q1800  . bisacodyl  5 mg Oral BID  . cyclobenzaprine  10 mg Oral QHS  . darbepoetin (ARANESP) injection - DIALYSIS  200 mcg Intravenous Q Thu-HD  . feeding supplement (PRO-STAT SUGAR FREE 64)  30 mL Oral Daily  . heparin  4,000 Units Dialysis Once in dialysis  . hydroxychloroquine  400 mg Oral Daily  . isosorbide mononitrate  30 mg Oral Daily  . metoprolol succinate  25 mg Oral QHS  . multivitamin  1  tablet Oral QHS  . pantoprazole  20 mg Oral Daily  . vancomycin  1,250 mg Intravenous Q T,Th,Sa-HD  . warfarin  0.5 mg Oral ONCE-1800  . Warfarin - Pharmacist Dosing Inpatient   Does not apply Q2595     Assessment/ Plan:   1. CVA- doing well in rehab 2. ESRD- continue with HD qTTS (first HD session 12/07/14) 3. Anemia: on ESA with an acute drop and is symptomatic. 1. Guaiac + stools and will need GI evaluation as she is on coumadin  2. transfused 2 units of PRBC's with HD 01/08/15. 3. Recheck showed appropriate bump in hgb but trending down again. 4. Continue to follow and transfuse prn 4. CKD-MBD: discontinue binders due to hypophosphatemia and nausea with phoslo 5. Nausea- worse today despite stopping binders.  Given anti-emetic with some relief. 6. Nutrition:per primary svc 7. Vascular access- left femoral AVG placed 01/04/15 by Dr. Donnetta Hutching as well as right femoral tunneled HD cath 8. Hypertension:stable off amlodipine 9. Endocarditis- presumably Liebman-Sachs 10. SLE- biopsy on 8/25. This biopsy was complicated by a hematoma and it revealed again diffuse proliferative changes with crescents indicating that it could respond to  immune suppressing therapy. She under went IV solumedrol, then high dose prednisone and was also on max dose cellcept. These agents have been weaned off as she has progressed to [place]. 11. SVT- on beta blocker  Rey Fors A 01/11/2015, 12:14 PM

## 2015-01-11 NOTE — Progress Notes (Signed)
Occupational Therapy Session Note  Patient Details  Name: [name] MRN: [id number] Date of Birth: [birth year]  Today's Date: 01/11/2015 OT Individual Time: 0800-0910 OT Individual Time Calculation (min): 70 min    Short Term Goals: Week 1:  OT Short Term Goal 1 (Week 1): Pt. will bathe with supervision OT Short Term Goal 2 (Week 1): Pt will dress with supervision OT Short Term Goal 3 (Week 1): Pt will perform transfer to toilet with supervision OT Short Term Goal 4 (Week 1): Pt will do tub transfer with bench at min assist level  Skilled Therapeutic Interventions/Progress Updates:    ADL retraining with focus on recall of strategies to increase safety and independence.  Pt continues to require verbal cues to lock Rollator brakes approx 75% of time.  Pt appears more fatigued and limited in self-care tasks due to back pain this session.  Pt required demonstration on use of reacher to participate in LB dressing.  Educated on various strategies to increase participation and independence with self-care tasks, to be utilized when not feeling great.    Therapy Documentation Precautions:  Precautions Precautions: Fall Precaution Comments: Pt's femoral cath is tunneled therefore she is able to get OOB. Restrictions Weight Bearing Restrictions: No Pain:   Pt with c/o pain in mid back, provided massage and notified RN.  See Function Navigator for Current Functional Status.   Therapy/Group: Individual Therapy  Simonne Come 01/11/2015, 9:18 AM

## 2015-01-11 NOTE — Progress Notes (Signed)
[place] for [facility] Indication: recurrent fevers  Allergies  Allergen Reactions  . Food Swelling    Red peppers    Patient Measurements: Height: 5\' 3"  (160 cm) Weight: 181 lb 14.1 oz (82.5 kg) IBW/kg (Calculated) : 52.4   Vital Signs: Temp: 103.2 F (39.6 C) (10/14 1550) Temp Source: Oral (10/14 1436) BP: 139/81 mmHg (10/14 1436) Pulse Rate: 122 (10/14 1436)  Labs:  Recent Labs  01/09/15 0505  01/09/15 0945 01/10/15 0445 01/10/15 0455 01/11/15 0515  HGB  --   < > 10.1* 9.6*  --  9.5*  HCT  --   --  31.2* 29.7*  --  29.7*  PLT  --   --  298 306  --  289  LABPROT 23.7*  --   --  27.4*  --  30.8*  INR 2.14*  --   --  2.59*  --  3.03*  CREATININE  --   --   --   --  5.56* 4.26*  < > = values in this interval not displayed.  Estimated Creatinine Clearance: 18.2 mL/min (by C-G formula based on Cr of 4.26).   Assessment: Patient on vancomycin through 10/25 for endocarditis, still spiking fevers. To start Zosyn for empiric coverage. Blood and urine cultures redrawn.  ESRD pt on HD TTSat.  Goal of Therapy:  Pre-HD vancomycin level = 15-25 Monitor platelets by anticoagulation protocol: Yes   Plan:  -Zosyn 2.25g IV q8h -vancomycin as per previous note today -follow c/s, LOT, clinical progression, HD schedule  Aycen Porreca D. Siyon Linck, PharmD, BCPS Clinical Pharmacist Pager: [phone] 01/11/2015 5:25 PM

## 2015-01-11 NOTE — Progress Notes (Signed)
Physical Therapy Session Note  Patient Details  Name: [name] MRN: [id number] Date of Birth: 1976/02/10  Today's Date: 01/11/2015 PT Individual Time: 1000-1040 PT Individual Time Calculation (min): 40 min   Short Term Goals: Week 1:  PT Short Term Goal 1 (Week 1): Pt will be Min guard for transfers PT Short Term Goal 2 (Week 1): Pt will be SBA for all bed mobility PT Short Term Goal 3 (Week 1): Pt will be Min Guard for gait 150' PT Short Term Goal 4 (Week 1): Pt will perform stairs 12 with Min A  Skilled Therapeutic Interventions/Progress Updates:    Pt received seated in w/c with no c/o pain. Pt does report nausea has been ongoing throughout the morning and has taken medication for it; agreeable to participate if limited to light activity. Performed standing tolerance/endurance activity while engaged in game. Requires seated rest break after approximately 7 min continuous standing. Pt reports nausea is not worsening, agreeable to ambulation in hallway. Performed gait x100' with rollator and supervision; note slower speed than normal. Returned to room where pt status assessed, pt states walking felt "not that bad" however soon after picks up pan as if she needs to vomit. Pt given several minute rest break and states that nausea is not residing and pt would like to return to bed. Stand pivot w/c >bed with supervision, minA to assist LEs into bed sit >supine. Pt remained supine in bed with all needs in reach; 4 bedrails intact per pt request. Missed 20 min skilled PT.  Therapy Documentation Precautions:  Precautions Precautions: Fall Precaution Comments: Pt's femoral cath is tunneled therefore she is able to get OOB. Restrictions Weight Bearing Restrictions: No General: PT Amount of Missed Time (min): 20 Minutes PT Missed Treatment Reason: Other (Comment) (nausea) Pain: Pain Assessment Pain Assessment: No/denies pain Pain Score: 0-No pain   See Function Navigator for Current  Functional Status.   Therapy/Group: Individual Therapy  Luberta Mutter 01/11/2015, 10:42 AM

## 2015-01-11 NOTE — Progress Notes (Signed)
    Subjective  -  Asked to eval left thigh AVGG and Left groin Cath site   Physical Exam:  Ecchymosis around thigh AVGG with good thrill Drainage from small skinopening above left thigh catheter site       Assessment/Plan:    No obvious sign of infection.  Thigh AVGG looks good Keep catheter site clean and dry  Niurka Benecke, [place] 01/11/2015 6:04 PM --  Filed Vitals:   01/11/15 1723  BP:   Pulse:   Temp: 100 F (37.8 C)  Resp:     Intake/Output Summary (Last 24 hours) at 01/11/15 1804 Last data filed at 01/11/15 1700  Gross per 24 hour  Intake    240 ml  Output   2450 ml  Net  -2210 ml     Laboratory CBC    Component Value Date/Time   WBC 8.7 01/11/2015 0515   HGB 9.5* 01/11/2015 0515   HCT 29.7* 01/11/2015 0515   PLT 289 01/11/2015 0515    BMET    Component Value Date/Time   NA 136 01/11/2015 0515   K 4.3 01/11/2015 0515   CL 98* 01/11/2015 0515   CO2 28 01/11/2015 0515   GLUCOSE 76 01/11/2015 0515   BUN 13 01/11/2015 0515   CREATININE 4.26* 01/11/2015 0515   CALCIUM 7.8* 01/11/2015 0515   GFRNONAA 12* 01/11/2015 0515   GFRAA 14* 01/11/2015 0515    COAG Lab Results  Component Value Date   INR 3.03* 01/11/2015   INR 2.59* 01/10/2015   INR 2.14* 01/09/2015   No results found for: PTT  Antibiotics Anti-infectives    Start     Dose/Rate Route Frequency Ordered Stop   01/11/15 1900  fluconazole (DIFLUCAN) tablet 100 mg     100 mg Oral Daily 01/11/15 1733 01/12/15 0759   01/11/15 1800  piperacillin-tazobactam (ZOSYN) IVPB 2.25 g     2.25 g 100 mL/hr over 30 Minutes Intravenous 3 times per day 01/11/15 1726     01/11/15 1745  fluconazole (DIFLUCAN) tablet 100 mg  Status:  Discontinued     100 mg Oral Daily 01/11/15 1733 01/11/15 1733   01/08/15 1200  vancomycin (VANCOCIN) 1,250 mg in sodium chloride 0.9 % 250 mL IVPB     1,250 mg 250 mL/hr over 60 Minutes Intravenous Every T-Th-Sa (Hemodialysis) 01/05/15 1712     01/06/15 0800   hydroxychloroquine (PLAQUENIL) tablet 400 mg     400 mg Oral Daily 01/05/15 1651     01/05/15 1800  vancomycin (VANCOCIN) IVPB 1000 mg/200 mL premix     1,000 mg 200 mL/hr over 60 Minutes Intravenous  Once 01/05/15 1712 01/05/15 1909       V. Leia Alf, M.D. [facility] [place] Office: [phone] Pager:  [phone]

## 2015-01-11 NOTE — Progress Notes (Signed)
Subjective/Complaints:  Left thigh tenderness resolved  ROS- neg SOB, Constipation, pain  Objective: Vital Signs: Blood pressure 142/74, pulse 111, temperature 100 F (37.8 C), temperature source Oral, resp. rate 20, height 5' 3"  (1.6 m), weight 82.5 kg (181 lb 14.1 oz), SpO2 96 %. No results found. Results for orders placed or performed during the hospital encounter of 01/05/15 (from the past 72 hour(s))  Prepare RBC     Status: None   Collection Time: 01/08/15  9:19 AM  Result Value Ref Range   Order Confirmation ORDER PROCESSED BY BLOOD BANK   Type and screen [place]     Status: None   Collection Time: 01/08/15  3:40 PM  Result Value Ref Range   ABO/RH(D) A POS    Antibody Screen NEG    Sample Expiration 01/11/2015    Unit Number [id number]    Blood Component Type RED CELLS,LR    Unit division 00    Status of Unit ISSUED,FINAL    Transfusion Status OK TO TRANSFUSE    Crossmatch Result Compatible    Unit Number [id number]    Blood Component Type RBC, LR IRR    Unit division 00    Status of Unit ISSUED,FINAL    Transfusion Status OK TO TRANSFUSE    Crossmatch Result Compatible   Protime-INR     Status: Abnormal   Collection Time: 01/09/15  5:05 AM  Result Value Ref Range   Prothrombin Time 23.7 (H) 11.6 - 15.2 seconds   INR 2.14 (H) 0.00 - 1.49  CBC     Status: Abnormal   Collection Time: 01/09/15  9:45 AM  Result Value Ref Range   WBC 9.8 4.0 - 10.5 K/uL   RBC 3.42 (L) 3.87 - 5.11 MIL/uL   Hemoglobin 10.1 (L) 12.0 - 15.0 g/dL    Comment: POST TRANSFUSION SPECIMEN   HCT 31.2 (L) 36.0 - 46.0 %   MCV 91.2 78.0 - 100.0 fL   MCH 29.5 26.0 - 34.0 pg   MCHC 32.4 30.0 - 36.0 g/dL   RDW 17.4 (H) 11.5 - 15.5 %   Platelets 298 150 - 400 K/uL  Occult blood card to lab, stool RN will collect     Status: Abnormal   Collection Time: 01/09/15  2:52 PM  Result Value Ref Range   Fecal Occult Bld POSITIVE (A) NEGATIVE  Protime-INR     Status:  Abnormal   Collection Time: 01/10/15  4:45 AM  Result Value Ref Range   Prothrombin Time 27.4 (H) 11.6 - 15.2 seconds   INR 2.59 (H) 0.00 - 1.49  CBC     Status: Abnormal   Collection Time: 01/10/15  4:45 AM  Result Value Ref Range   WBC 7.6 4.0 - 10.5 K/uL   RBC 3.18 (L) 3.87 - 5.11 MIL/uL   Hemoglobin 9.6 (L) 12.0 - 15.0 g/dL   HCT 29.7 (L) 36.0 - 46.0 %   MCV 93.4 78.0 - 100.0 fL   MCH 30.2 26.0 - 34.0 pg   MCHC 32.3 30.0 - 36.0 g/dL   RDW 17.1 (H) 11.5 - 15.5 %   Platelets 306 150 - 400 K/uL  Renal function panel     Status: Abnormal   Collection Time: 01/10/15  4:55 AM  Result Value Ref Range   Sodium 135 135 - 145 mmol/L   Potassium 3.9 3.5 - 5.1 mmol/L   Chloride 97 (L) 101 - 111 mmol/L   CO2 29 22 - 32  mmol/L   Glucose, Bld 78 65 - 99 mg/dL   BUN 20 6 - 20 mg/dL   Creatinine, Ser 5.56 (H) 0.44 - 1.00 mg/dL   Calcium 8.3 (L) 8.9 - 10.3 mg/dL   Phosphorus 2.2 (L) 2.5 - 4.6 mg/dL   Albumin 1.9 (L) 3.5 - 5.0 g/dL   GFR calc non Af Amer 9 (L) >60 mL/min   GFR calc Af Amer 10 (L) >60 mL/min    Comment: (NOTE) The eGFR has been calculated using the CKD EPI equation. This calculation has not been validated in all clinical situations. eGFR's persistently <60 mL/min signify possible Chronic Kidney Disease.    Anion gap 9 5 - 15  Protime-INR     Status: Abnormal   Collection Time: 01/11/15  5:15 AM  Result Value Ref Range   Prothrombin Time 30.8 (H) 11.6 - 15.2 seconds   INR 3.03 (H) 0.00 - 1.49  CBC     Status: Abnormal   Collection Time: 01/11/15  5:15 AM  Result Value Ref Range   WBC 8.7 4.0 - 10.5 K/uL   RBC 3.15 (L) 3.87 - 5.11 MIL/uL   Hemoglobin 9.5 (L) 12.0 - 15.0 g/dL   HCT 29.7 (L) 36.0 - 46.0 %   MCV 94.3 78.0 - 100.0 fL   MCH 30.2 26.0 - 34.0 pg   MCHC 32.0 30.0 - 36.0 g/dL   RDW 17.3 (H) 11.5 - 15.5 %   Platelets 289 150 - 400 K/uL  Renal function panel     Status: Abnormal   Collection Time: 01/11/15  5:15 AM  Result Value Ref Range   Sodium 136  135 - 145 mmol/L   Potassium 4.3 3.5 - 5.1 mmol/L   Chloride 98 (L) 101 - 111 mmol/L   CO2 28 22 - 32 mmol/L   Glucose, Bld 76 65 - 99 mg/dL   BUN 13 6 - 20 mg/dL   Creatinine, Ser 4.26 (H) 0.44 - 1.00 mg/dL   Calcium 7.8 (L) 8.9 - 10.3 mg/dL   Phosphorus 2.3 (L) 2.5 - 4.6 mg/dL   Albumin 2.1 (L) 3.5 - 5.0 g/dL   GFR calc non Af Amer 12 (L) >60 mL/min   GFR calc Af Amer 14 (L) >60 mL/min    Comment: (NOTE) The eGFR has been calculated using the CKD EPI equation. This calculation has not been validated in all clinical situations. eGFR's persistently <60 mL/min signify possible Chronic Kidney Disease.    Anion gap 10 5 - 15     HEENT: normal Cardio: RRR and no murmurs Resp: CTA B/L and unlabored GI: BS positive and non tender Extremity:  Pulses positive and No Edema, bruising Left thigh around surgical site, tenderness lateral aspect of graft, good bruit Skin:   Intact Neuro: Alert/Oriented, Flat and Abnormal Motor 4-/5 LUE and LLE, 5- in RUE and RLE Musc/Skel:  Other no pain with AROM in UE or LEs Gen NAD   Assessment/Plan: 1. Functional deficits secondary to Right MCA which require 3+ hours per day of interdisciplinary therapy in a comprehensive inpatient rehab setting. Physiatrist is providing close team supervision and 24 hour management of active medical problems listed below. Physiatrist and rehab team continue to assess barriers to discharge/monitor patient progress toward functional and medical goals. FIM: Function - Bathing Position: Wheelchair/chair at sink Body parts bathed by patient: Right arm, Left arm, Chest, Abdomen, Front perineal area, Buttocks, Right upper leg, Left upper leg Body parts bathed by helper: Back Bathing not applicable:  Right lower leg, Left lower leg Assist Level: Supervision or verbal cues  Function- Upper Body Dressing/Undressing What is the patient wearing?: Bra, Pull over shirt/dress Bra - Perfomed by patient: Thread/unthread right bra  strap, Thread/unthread left bra strap, Hook/unhook bra (pull down sports bra) Pull over shirt/dress - Perfomed by patient: Thread/unthread right sleeve, Thread/unthread left sleeve, Put head through opening, Pull shirt over trunk Assist Level: Set up Set up : To obtain clothing/put away Function - Lower Body Dressing/Undressing What is the patient wearing?: Underwear, Socks (skirt) Position: Wheelchair/chair at sink Underwear - Performed by patient: Thread/unthread right underwear leg, Thread/unthread left underwear leg, Pull underwear up/down Underwear - Performed by helper: Thread/unthread right underwear leg, Thread/unthread left underwear leg Socks - Performed by helper: Don/doff right sock, Don/doff left sock Shoes - Performed by helper: Don/doff right shoe, Don/doff left shoe, Fasten right, Fasten left Assist for lower body dressing: Touching or steadying assistance (Pt > 75%)  Function - Toileting Toileting activity did not occur: Safety/medical concerns Toileting steps completed by patient: Adjust clothing prior to toileting, Performs perineal hygiene, Adjust clothing after toileting Toileting steps completed by helper: Performs perineal hygiene Assist level: Supervision or verbal cues  Function - Air cabin crew transfer activity did not occur: Safety/medical concerns Toilet transfer assistive device: Elevated toilet seat/BSC over toilet, Grab bar Assist level to toilet: No Help, no cues, assistive device, takes more than a reasonable amount of time Assist level from toilet: No Help, no cues, assistive device, takes more than a reasonable amount of time Assist level to bedside commode (at bedside): Moderate assist (Pt 50 - 74%/lift or lower) Assist level from bedside commode (at bedside): Moderate assist (Pt 50 - 74%/lift or lower)  Function - Chair/bed transfer Chair/bed transfer activity did not occur: Safety/medical concerns Chair/bed transfer method: Stand  pivot Chair/bed transfer assist level: Supervision or verbal cues Chair/bed transfer assistive device:  (rollator) Chair/bed transfer details: Verbal cues for safe use of DME/AE, Verbal cues for technique  Function - Locomotion: Wheelchair Will patient use wheelchair at discharge?: No Type: Manual Max wheelchair distance: 100 Assist Level: Touching or steadying assistance (Pt > 75%) Assist Level: Touching or steadying assistance (Pt > 75%) Wheel 150 feet activity did not occur: Safety/medical concerns Turns around,maneuvers to table,bed, and toilet,negotiates 3% grade,maneuvers on rugs and over doorsills: No Function - Locomotion: Ambulation Assistive device: Other (comment) (rollator) Max distance: 230 Assist level: Supervision or verbal cues Assist level: Supervision or verbal cues Assist level: Supervision or verbal cues Walk 150 feet activity did not occur: Safety/medical concerns Assist level: Supervision or verbal cues Walk 10 feet on uneven surfaces activity did not occur: Safety/medical concerns Assist level: Touching or steadying assistance (Pt > 75%) (ramp)  Function - Comprehension Comprehension: Auditory Comprehension assist level: Follows complex conversation/direction with no assist  Function - Expression Expression: Verbal Expression assist level: Expresses complex ideas: With no assist  Function - Social Interaction Social Interaction assist level: Interacts appropriately with others - No medications needed.  Function - Problem Solving Problem solving assist level: Solves complex problems: Recognizes & self-corrects  Function - Memory Memory assist level: Complete Independence: No helper Patient normally able to recall (first 3 days only): Location of own room, Staff names and faces, Current season, That he or she is in a hospital   Medical Problem List and Plan: 1. Functional deficits secondary to right MCA infarct with history of lupus/multi-medical, has  left LE>UE weakness, some weakness may be post op pain inhibition post  op 2. DVT Prophylaxis/Anticoagulation: Coumadin per pharmacy protocol and no bridging required. Monitor for any bleeding episodes, INR therapeutic as of 10/11 3. Pain Management: Oxycodone and flexeril as needed. Monitor with increased mobility 4. Lupus nephritis with end-stage renal disease. Continue hemodialysis as advised. Permanent access graft placement 01/04/2015.Nephro following, no further pain around graft, WBC normal 10/13 5. Neuropsych: This patient is capable of making decisions on her own behalf. 6. Skin/Wound Care: Routine skin care 7. Fluids/Electrolytes/Nutrition: Strict I&O with follow-up chemistries, fair intake ~50% 8. Endocarditis of mitral valve. Unable to rule out infectious endocarditis as patient is immunosuppressed and leukopenic. Blood cultures negative. Plan vancomycin until 01/22/2015. 9. Hypertension. Norvasc 5 mg daily, Imdur or 30 mg daily, Toprol-XL 25 mg daily at bedtime. Monitor with increased mobility, 142/73 10. Pseudomonas urinary tract infection. Ceftazadime completed 01/03/2015, will repeat UA given low grade temp, no urinary symptoms 11. Morbid obesity. Dietary follow-up 12. Sacral decubitus. Foam dressing change every 3 days as needed pressure relief, bed positioning, turn q2h 13. Hyperlipidemia. Lipitor LOS (Days) 6 A FACE TO FACE EVALUATION WAS PERFORMED  [name] E 01/11/2015, 7:09 AM

## 2015-01-11 NOTE — Consult Note (Signed)
Date: 01/11/2015               Patient Name:  [name] MRN: [id number]  DOB: 1975-08-09 Age / Sex: 39 y.o., female   PCP: Lin Landsman, MD         Requesting Physician: Dr. Charlett Blake, MD    Consulting Reason:  Fever of unknown origin     Chief Complaint: I've been having fevers and my dialysis site hurts   History of Present Illness:  We were consulted from PM&R. Pt is 39 yo with lupus nephritis now on HD TTS and complex medical history with nearly few weeks long admission last month requiring multiple courses of IV antibiotics, who has had low grade fever for 2-3 days. She had a graft placed in her left thigh on last Friday and ever since she started doing the PT, she complains of pain in that site. She has been feeling nauseous for 1 week. She said there was some blood in stool, and there was vaginal discharge that was incidentally found. She denies dysuria, hematuria,  Frequency or urgency/  She receives vanc at the dialysis center 3 times a week- last dose 10/25.  All other ROS negative.   Brief Interval history: admitted on 9/5 for chronic renal failure 2/2 lupus nephritis and UTi. UA had grown klebsiella.   9/9 had temporary HD catheter placed due to poor vasculature at [facility] site., and had complications including bleeding due to that. HIT and DIC panels negative.  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 9/23-had stroke in R MCA.  9/27 TEE showed vegetation on mitral valve.   She was then treated with 3 week course of vanc.  Also found to have pseudomonas UTi and treated with ceftaz for 5 days.  10/7- had left thigh permanent access placement, and needs to mature for 4 weeks. Until then. They are using the right thigh catheter for dialysis. She is on HD TTS.  10/8 was transitioned to rehab  She should be on vanc until 10/25  Indefinite coumadin    Meds: Current  Facility-Administered Medications  Medication Dose Route Frequency Provider Last Rate Last Dose  . 0.9 %  sodium chloride infusion   Intravenous Once Donato Heinz, MD      . acetaminophen (TYLENOL) tablet 500 mg  500 mg Oral Q6H PRN Lew Dawes V, MD   500 mg at 01/11/15 1625  . atorvastatin (LIPITOR) tablet 20 mg  20 mg Oral q1800 Lavon Paganini Angiulli, PA-C   20 mg at 01/09/15 1725  . bisacodyl (DULCOLAX) EC tablet 5 mg  5 mg Oral BID Aleksei Plotnikov V, MD   5 mg at 01/11/15 0820  . cyclobenzaprine (FLEXERIL) tablet 10 mg  10 mg Oral QHS Lavon Paganini Angiulli, PA-C   10 mg at 01/10/15 2003  . Darbepoetin Alfa (ARANESP) injection 200 mcg  200 mcg Intravenous Q Thu-HD Mauricia Area, MD   200 mcg at 01/10/15 1806  . feeding supplement (PRO-STAT SUGAR FREE 64) liquid 30 mL  30 mL Oral Daily Lavon Paganini Angiulli, PA-C   30 mL at 01/11/15 0820  . fluconazole (DIFLUCAN) tablet 100 mg  100 mg Oral Daily Pamela S Love, PA-C      . heparin injection 4,000 Units  4,000 Units Dialysis Once in dialysis Donato Heinz, MD      . hydroxychloroquine (PLAQUENIL) tablet 400 mg  400 mg Oral Daily Lavon Paganini Angiulli, PA-C   400 mg at 01/11/15 0820  . isosorbide mononitrate (IMDUR) 24 hr tablet 30 mg  30 mg Oral Daily Lavon Paganini Angiulli, PA-C   30 mg at 01/11/15 0819  . metoprolol succinate (TOPROL-XL) 24 hr tablet 25 mg  25 mg Oral QHS Daniel J Angiulli, PA-C   25 mg at 01/10/15 2003  . multivitamin (RENA-VIT) tablet 1 tablet  1 tablet Oral QHS Lavon Paganini Angiulli, PA-C   1 tablet at 01/09/15 2200  . ondansetron (ZOFRAN) tablet 4 mg  4 mg Oral Q6H PRN Lavon Paganini Angiulli, PA-C   4 mg at 01/11/15 0930   Or  . ondansetron (ZOFRAN) injection 4 mg  4 mg Intravenous Q6H PRN Lavon Paganini Angiulli, PA-C      . oxyCODONE-acetaminophen (PERCOCET/ROXICET) 5-325 MG per tablet 1 tablet  1 tablet Oral Q6H PRN Lavon Paganini Angiulli, PA-C   1 tablet at 01/10/15 1812  . pantoprazole (PROTONIX) EC tablet 20 mg  20 mg Oral Daily Lavon Paganini  Angiulli, PA-C   20 mg at 01/11/15 0820  . piperacillin-tazobactam (ZOSYN) IVPB 2.25 g  2.25 g Intravenous 3 times per day Lauren D Bajbus, RPH      . sodium chloride 0.9 % injection 10-40 mL  10-40 mL Intracatheter PRN Charlett Blake, MD   10 mL at 01/10/15 1443  . sorbitol 70 % solution 30 mL  30 mL Oral Daily PRN Lavon Paganini Angiulli, PA-C   30 mL at 01/05/15 1850  . vancomycin (VANCOCIN) 1,250 mg in sodium chloride 0.9 % 250 mL IVPB  1,250 mg Intravenous Q T,Th,Sa-HD Donalynn Furlong [place], RPH   1,250 mg at 01/10/15 1807  . warfarin (COUMADIN) tablet 0.5 mg  0.5 mg Oral ONCE-1800 Leodis Sias, RPH      . Warfarin - Pharmacist Dosing Inpatient   Does not apply [place], [facility]        Allergies: Allergies as of 01/05/2015 - Review Complete 01/05/2015  Allergen Reaction Noted  . Food Swelling 08/29/2014   Past Medical History  Diagnosis Date  . Hypertension   . Cardiac arrhythmia   . SVT (supraventricular tachycardia) ([place])     "today, last week, 2 wk ago; maybe 1 month ago, etc; started w/in last 3-4 yrs"(07/20/2012)  . Fainting     "~ 1 month ago; probably related to SVT" (07/20/2012)  . Shortness of breath     "related to SVT episodes" (07/20/2012)  . Chest pain at rest     "related to SVT" (07/20/2012)  . Lupus (systemic lupus erythematosus) ([place])   . GERD (gastroesophageal reflux disease)   . Fibromyalgia   . Irritable bowel syndrome (IBS)   . Hypercholesterolemia   . Heart murmur     "small" (12/25/2014)  . TIA (transient ischemic attack) 12/21/2014  . Sleep apnea     "don't wear mask anymore" (12/25/2014)  . Anemia   . History of blood transfusion "several"    "related to low counts"  . Daily headache   . Arthritis     "hips" (12/25/2014)  . Anxiety     "sometimes; don't take anything for it" (12/25/2014)  . ESRD (end stage renal disease) on dialysis ([place]) since 12/07/2014    "I've been doing it here on [facility]" (12/25/2014)  . Chronic renal failure, stage 4 (severe)  (H[facility])   . Renal insufficiency    Past Surgical History  Procedure Laterality Date  . Cesarean  section  2001; 2010  . Cervical biopsy  2013  . Supraventricular tachycardia ablation  07/21/12     Dr. Cristopher Peru   . Peripheral vascular catheterization N/A 12/14/2014    Procedure: Upper Extremity Venography;  Surgeon: Elam Dutch, MD;  Location: [facility];  Service: Cardiovascular;  Laterality: N/A;  . Insertion hybrid anteriovenous gortex graft Left 12/18/2014    Procedure: INSERTION GORE HYBRID ARTERIOVENOUS GRAFT LEFT AXILLO-BRACHIAL.;  Surgeon: Serafina Mitchell, MD;  Location[facility] OR;  Service: Vascular;  Laterality: Left;  . Ligation of arteriovenous  fistula Left 12/19/2014    Procedure: LIGATION OF FISTULA;  Surgeon: Elam Dutch, MD;  Location: [place];  Service: Vascular;  Laterality: Left;  . Appendectomy  2011  . Tubal ligation  2010  . Tee without cardioversion N/A 12/25/2014    Procedure: TRANSESOPHAGEAL ECHOCARDIOGRAM (TEE);  Surgeon: Sueanne Margarita, MD;  Location: [place];  Service: Cardiovascular;  Laterality: N/A;  . Av fistula placement Left 01/04/2015    Procedure: INSERTION OF ARTERIOVENOUS (AV) GORE-TEX GRAFT THIGH;  Surgeon: Rosetta Posner, MD;  [facility];  Service: Vascular;  Laterality: Left;   Family History  Problem Relation Age of Onset  . Cancer Mother     breast and ovarian  . BRCA 1/2 Sister    Social History   Social History  . Marital Status: Single    Spouse Name: N/A  . Number of Children: N/A  . Years of Education: N/A   Occupational History  . Not on file.   Social History Main Topics  . Smoking status: Never Smoker   . Smokeless tobacco: Never Used  . Alcohol Use: No     Comment: 07/20/2012 "might have a beer once/yr"  . Drug Use: No  . Sexual Activity: Not Currently    Birth Control/ Protection: None   Other Topics Concern  . Not on file   Social History Narrative    Review of Systems: ROS: General: ++fevers,  +chills, no changes in weight, no changes in appetite Skin: no rash HEENT: no blurry vision, hearing changes, sore throat Pulm: no dyspnea, coughing, wheezing CV: no chest pain, palpitations, shortness of breath Abd: no abdominal pain, nausea/vomiting, diarrhea/constipation GU: no dysuria, hematuria, polyuria, has vaginal discharge  Ext: no arthralgias, myalgias Neuro: no weakness, numbness, or tingling   Physical Exam: Blood pressure 139/81, pulse 122, temperature 100 F (37.8 C), temperature source Oral, resp. rate 20, height 5' 3"  (1.6 m), weight 181 lb 14.1 oz (82.5 kg), last menstrual period 11/27/2014, SpO2 99 %.  General: Vital signs reviewed. Patient in no acute distress, afebrile at time of examination, but body warm to touch  Cardiovascular: tachycardic, regular rhythm, no murmur appreciated  Pulmonary/Chest: Clear to auscultation bilaterally, no wheezes, rales, or rhonchi. Abdominal: Soft, non-tender, non-distended, BS + Extremities: No lower extremity edema bilaterally, pulses symmetric and intact bilaterally, no calf tenderness                    Has right thigh temporary dialysis catheter c/d/i, has left thigh permanent catheter with palpable thrill heard, no erythema surrounding the site.  Skin: Warm, dry and intact. No rashes or erythema.   Lab results: Results for orders placed or performed during the hospital encounter of 01/05/15 (from the past 24 hour(s))  Protime-INR     Status: Abnormal   Collection Time: 01/11/15  5:15 AM  Result Value Ref Range   Prothrombin Time 30.8 (H) 11.6 -  15.2 seconds   INR 3.03 (H) 0.00 - 1.49  CBC     Status: Abnormal   Collection Time: 01/11/15  5:15 AM  Result Value Ref Range   WBC 8.7 4.0 - 10.5 K/uL   RBC 3.15 (L) 3.87 - 5.11 MIL/uL   Hemoglobin 9.5 (L) 12.0 - 15.0 g/dL   HCT 29.7 (L) 36.0 - 46.0 %   MCV 94.3 78.0 - 100.0 fL   MCH 30.2 26.0 - 34.0 pg   MCHC 32.0 30.0 - 36.0 g/dL   RDW 17.3 (H) 11.5 - 15.5 %   Platelets  289 150 - 400 K/uL  Renal function panel     Status: Abnormal   Collection Time: 01/11/15  5:15 AM  Result Value Ref Range   Sodium 136 135 - 145 mmol/L   Potassium 4.3 3.5 - 5.1 mmol/L   Chloride 98 (L) 101 - 111 mmol/L   CO2 28 22 - 32 mmol/L   Glucose, Bld 76 65 - 99 mg/dL   BUN 13 6 - 20 mg/dL   Creatinine, Ser 4.26 (H) 0.44 - 1.00 mg/dL   Calcium 7.8 (L) 8.9 - 10.3 mg/dL   Phosphorus 2.3 (L) 2.5 - 4.6 mg/dL   Albumin 2.1 (L) 3.5 - 5.0 g/dL   GFR calc non Af Amer 12 (L) >60 mL/min   GFR calc Af Amer 14 (L) >60 mL/min   Anion gap 10 5 - 15  Urinalysis, Routine w reflex microscopic (not at [facility])     Status: Abnormal   Collection Time: 01/11/15  4:35 PM  Result Value Ref Range   Color, Urine YELLOW YELLOW   APPearance CLOUDY (A) CLEAR   Specific Gravity, Urine 1.022 1.005 - 1.030   pH 6.5 5.0 - 8.0   Glucose, UA NEGATIVE NEGATIVE mg/dL   Hgb urine dipstick NEGATIVE NEGATIVE   Bilirubin Urine SMALL (A) NEGATIVE   Ketones, ur NEGATIVE NEGATIVE mg/dL   Protein, ur >300 (A) NEGATIVE mg/dL   Urobilinogen, UA 0.2 0.0 - 1.0 mg/dL   Nitrite NEGATIVE NEGATIVE   Leukocytes, UA TRACE (A) NEGATIVE  Urine microscopic-add on     Status: Abnormal   Collection Time: 01/11/15  4:35 PM  Result Value Ref Range   Squamous Epithelial / LPF RARE RARE   WBC, UA 3-6 <3 WBC/hpf   Bacteria, UA FEW (A) RARE     Imaging results:  Dg Chest 2 View  01/11/2015  CLINICAL DATA:  Fever. EXAM: CHEST  2 VIEW COMPARISON:  November 13, 2014. FINDINGS: The heart size and mediastinal contours are within normal limits. No pneumothorax or pleural effusion is noted. Mild left basilar subsegmental atelectasis is noted. Right lung is clear. Right internal jugular catheter is noted with distal tip overlying expected position of the SVC. The visualized skeletal structures are unremarkable. IMPRESSION: Mild left basilar subsegmental atelectasis. Electronically Signed   By: Marijo Conception, M.D.   On: 01/11/2015 16:15      Assessment, Plan, & Recommendations by Problem: Active Problems:   Right middle cerebral artery stroke (HCC)   Left hemiparesis (HCC)   Post-operative pain   Adjustment disorder with depressed mood  39 yo woman with complex medical history as described in the HPI, now with recurrent fevers of unknown origin.  Fever: Patient spiked a fever of 101.9 on 10/13 then 102.9 to 103.2 on 10/14. Previously afebrile prior to this. Patient grew pseudomonas in urine culture on 10/1 and was treated with Ceftaz for 5 days. Currently, no  leukocytosis (8). UA not convincing for UTI. CXR only atelectasis. UCx and BCx pending. Concern for AVGG and left groin cath infection, but Vascular surgery saw patient and did not see any obvious signs of infection. Started on Vanc and Zosyn. Patient is immunocompromised. If work up unremarkable, consider drug fever. PM&R would like [place] to consult until fever defervesces and to assist with infectious work up.   Some other differentials could be lupus nephritis, drug fever.   -on vanc and zosyn (vanc end date was supposed to be 10/25- she receives vanc at HD center) -follow up blood cultures -follow up urine cultures  DVT ppx- on coumadin     Dispo: Disposition is deferred at this time, awaiting improvement of current medical problems. Anticipated discharge in approximately  unknown day(s).   The patient does have a current PCP Lin Landsman, MD) and does need an [facility] hospital follow-up appointment after discharge.  The patient does not have transportation limitations that hinder transportation to clinic appointments.  Signed: Burgess Estelle, MD 01/11/2015, 6:06 PM

## 2015-01-11 NOTE — Plan of Care (Signed)
Problem: RH PAIN MANAGEMENT Goal: RH STG PAIN MANAGED AT OR BELOW PT'S PAIN GOAL <3 Outcome: Progressing No c/o pain     

## 2015-01-11 NOTE — Progress Notes (Signed)
Occupational Therapy Session Note  Patient Details  Name: [name] MRN: [id number] Date of Birth: 1975-05-15  Today's Date: 01/11/2015 OT Individual Time:  -     and Today's Date: 01/11/2015 OT Group Time:  -   1300-1430  (90 min)      Short Term Goals: Week 1:  OT Short Term Goal 1 (Week 1): Pt. will bathe with supervision OT Short Term Goal 2 (Week 1): Pt will dress with supervision OT Short Term Goal 3 (Week 1): Pt will perform transfer to toilet with supervision OT Short Term Goal 4 (Week 1): Pt will do tub transfer with bench at min assist level      Skilled Therapeutic Interventions/Progress Updates:    Pt lying in bed with lunch tray in front of her.  She reported she felt nauseated and did not want to get up. She agreed she would engage in activities after much encouragement.   Pt eating some of the food on her tray.  She reported she had a pill for nausea but did not know when she had it.   RN note stated around 11am.  Pt transferred to wc with SBA.   OT gathered dirty clothes.  Pt propelled wc to laundry room with increased time.  Pt ambulated 6 feet with rollator walker to put clothes in washing machine and back to wc.  Pt propelled wc to kitchen and picked out pancakes to make next week.  Back to room, OT soaked and lotioned pt's dry feet.  Pt transferred back to bed with SBA.      Therapy Documentation Precautions:  Precautions Precautions: Fall Precaution Comments: Pt's femoral cath is tunneled therefore she is able to get OOB. Restrictions Weight Bearing Restrictions: No :     Pain:  No pain.  Nauseated.           See Function Navigator for Current Functional Status.   Therapy/Group: Individual Therapy  Lisa Roca 01/11/2015, 8:01 AM

## 2015-01-11 NOTE — Consult Note (Signed)
INITIAL PSYCHODIAGNOSTIC EXAMINATION - [facility]   [name] is a 39 year old, right-handed woman, who was seen for an initial psychodiagnostic examination to evaluate her emotional state in the setting of Lupus, kidney failure that now requires hemodialysis, and stroke.  According to her medical record, she was diagnosed recently with lupus nephritis.  She presented on 12/03/14 with dysuria, fever and nausea.  Workup revealed that she needed a permanent dialysis catheter, which was placed on 12/11/14.  On 12/21/14 she became verbally less responsive and was observed to have left facial droop and right eye deviation.  CT of the head was negative, but MRI of the brain demonstrated a right MCA infarct.  She was referred for the current neuropsychological consultation to evaluate for possible depressed mood.     [name] acknowledged feeling "a little overwhelmed" and stated that the hardest parts are acceptance of the severity of her illness as well as the loss of independence.  She acknowledged multiple symptoms of depression in reaction to adjustment to illness, but denied suicidal ideation as well as history of major depression.  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  She stated that she plans to start saying "no" to people when she is not feeling well.  In addition, she stated that she will take her prescriptions as prescribed and keep all of her doctor's appointments.  Time was spent exploring potential barriers to achieving her personal goals of keeping herself healthier and in processing how to overcome those potential barriers.    IMPRESSIONS AND RECOMMENDATIONS:  [name] described symptoms consistent with an adjustment disorder with depressed mood.  Time was spent during the current session challenging some of her all-or-nothing  thinking and in encouraging her to express her feelings as she experiences them, rather than keeping them inside.  [name] was initially resistant to considering participation in individual psychotherapy post-discharge, but was ultimately agreeable.  Contact information for providers in her area should be included in her discharge paperwork for that purpose.    DIAGNOSES:   Lupus Stroke Adjustment disorder with depressed mood  Marlane Hatcher, Psy.D.  Clinical Neuropsychologist

## 2015-01-11 NOTE — Plan of Care (Signed)
Problem: RH BOWEL ELIMINATION Goal: RH STG MANAGE BOWEL WITH ASSISTANCE STG Manage Bowel with Assistance. Mod I  Outcome: Progressing No incontinent episode report

## 2015-01-11 NOTE — Progress Notes (Addendum)
Patient with fever 102 this afternoon. Has candidal vulvovaginits. Left thigh--graft site warm and erythematous appearing and tender--patient reports pain usually after therapy.  HD cath right thigh with dry dressing but noted to have bleeding from area above the cath--2 mm hole oozing blood. Non tender without purulent drainage.   Temp down to 100 degrees after tylenol.  Dr. Trula Slade on call for [facility] contacted to evaluate graft and HD cath.  [facility] contacted for consult on Fever.

## 2015-01-12 ENCOUNTER — Inpatient Hospital Stay (HOSPITAL_COMMUNITY): Payer: Medicaid Other | Admitting: Physical Therapy

## 2015-01-12 ENCOUNTER — Inpatient Hospital Stay (HOSPITAL_COMMUNITY): Payer: Medicaid Other | Admitting: Occupational Therapy

## 2015-01-12 DIAGNOSIS — R5082 Postprocedural fever: Secondary | ICD-10-CM

## 2015-01-12 LAB — COMPREHENSIVE METABOLIC PANEL
ALBUMIN: 1.9 g/dL — AB (ref 3.5–5.0)
ALK PHOS: 52 U/L (ref 38–126)
ALT: 12 U/L — AB (ref 14–54)
ANION GAP: 9 (ref 5–15)
AST: 41 U/L (ref 15–41)
BUN: 25 mg/dL — ABNORMAL HIGH (ref 6–20)
CALCIUM: 8.2 mg/dL — AB (ref 8.9–10.3)
CHLORIDE: 96 mmol/L — AB (ref 101–111)
CO2: 29 mmol/L (ref 22–32)
Creatinine, Ser: 6.53 mg/dL — ABNORMAL HIGH (ref 0.44–1.00)
GFR calc non Af Amer: 7 mL/min — ABNORMAL LOW (ref >60)
GFR, EST AFRICAN AMERICAN: 8 mL/min — AB (ref >60)
GLUCOSE: 91 mg/dL (ref 65–99)
Potassium: 4.1 mmol/L (ref 3.5–5.1)
SODIUM: 134 mmol/L — AB (ref 135–145)
Total Bilirubin: 0.8 mg/dL (ref 0.3–1.2)
Total Protein: 4.9 g/dL — ABNORMAL LOW (ref 6.5–8.1)

## 2015-01-12 LAB — CBC
HEMATOCRIT: 30.7 % — AB (ref 36.0–46.0)
Hemoglobin: 9.6 g/dL — ABNORMAL LOW (ref 12.0–15.0)
MCH: 29.4 pg (ref 26.0–34.0)
MCHC: 31.3 g/dL (ref 30.0–36.0)
MCV: 93.9 fL (ref 78.0–100.0)
PLATELETS: 339 10*3/uL (ref 150–400)
RBC: 3.27 MIL/uL — ABNORMAL LOW (ref 3.87–5.11)
RDW: 17.2 % — AB (ref 11.5–15.5)
WBC: 8.8 10*3/uL (ref 4.0–10.5)

## 2015-01-12 LAB — PROTIME-INR
INR: 3.8 — AB (ref 0.00–1.49)
Prothrombin Time: 36.6 seconds — ABNORMAL HIGH (ref 11.6–15.2)

## 2015-01-12 MED ORDER — HEPARIN SODIUM (PORCINE) 1000 UNIT/ML DIALYSIS
20.0000 [IU]/kg | INTRAMUSCULAR | Status: DC | PRN
  Filled 2015-01-12: qty 2

## 2015-01-12 NOTE — Progress Notes (Signed)
Occupational Therapy Session Note  Patient Details  Name: [name] MRN: [id number] Date of Birth: 30-Mar-1976  Today's Date: 01/12/2015 OT Individual Time:  -   1300-1345      Short Term Goals: Week 1:  OT Short Term Goal 1 (Week 1): Pt. will bathe with supervision OT Short Term Goal 2 (Week 1): Pt will dress with supervision OT Short Term Goal 3 (Week 1): Pt will perform transfer to toilet with supervision OT Short Term Goal 4 (Week 1): Pt will do tub transfer with bench at min assist level      Skilled Therapeutic Interventions/Progress Updates:    Pt.lying in bed.  Children present.  Went from supine>sit>wc with min to SBA.  Propelled wc to apartment.  Did standing and washing dishes and making PNUT butter crackers for 5 min in in standing with SBA.  Took seated rest break for 3 minutes.  Ambulated to bathroom with Rollator walker> toilet with SBA.  Pt. Back to room and remained in wc with all needs in reach.  .    Therapy Documentation Precautions:  Precautions Precautions: Fall Precaution Comments: Pt's femoral cath is tunneled therefore she is able to get OOB. Restrictions Weight Bearing Restrictions: No   Pain:  none       :    See Function Navigator for Current Functional Status.   Therapy/Group: Individual Therapy  Lisa Roca 01/12/2015, 1:36 PM

## 2015-01-12 NOTE — Procedures (Signed)
Patient was seen on dialysis and the procedure was supervised. BFR 400 Via r fem tdc BP is 159/91.  Patient appears to be tolerating treatment well

## 2015-01-12 NOTE — Progress Notes (Signed)
   Subjective: Patient says she is feeling fine this morning. Denies any subjective fevers or chills at this time. States she was feeling nauseous 2 days ago with fever, but no vomiting. She denies any headache, diaphoresis, muscle or joint pain, stomach pain, or urinary symptoms at this time. She says she has pain at the left thigh when she walks, but not at rest. Objective: Vital signs in last 24 hours: Filed Vitals:   01/11/15 1723 01/11/15 1845 01/11/15 2202 01/12/15 0500  BP:   130/67 133/58  Pulse:   101 105  Temp: 100 F (37.8 C) 99.2 F (37.3 C) 98.6 F (37 C) 100.3 F (37.9 C)  TempSrc: Oral Oral Oral Oral  Resp:    18  Height:      Weight:    181 lb 10.5 oz (82.4 kg)  SpO2:    100%   Weight change: 27 lb 5.4 oz (12.4 kg)  Intake/Output Summary (Last 24 hours) at 01/12/15 1144 Last data filed at 01/12/15 1000  Gross per 24 hour  Intake    480 ml  Output    200 ml  Net    280 ml   General: resting in bed Cardiac: tachycardic, no rubs, murmurs or gallops Pulm: clear to auscultation bilaterally, moving normal volumes of air Abd: soft, tender to deep palpation LUQ, nondistended, BS present Ext: warm and well perfused, no pedal edema, left thigh permanent graft with good thrill and some bruising in surrounding area but no bleeding or obvious sign of infection. Right thigh temporary cath clean, dry, and intact Neuro: alert and oriented X3  Assessment/Plan: Active Problems:   Right middle cerebral artery stroke (HCC)   Left hemiparesis (HCC)   Post-operative pain   Adjustment disorder with depressed mood  Fever of Unknown Origin: Patient had oral temperatures of 101.9 on 10/13, 102.9 and 103.2 on 10/14. CXR on 10/14 showed mild left basilar subsegmental atelectasis, no pleural effusion or consolidation noted. She denies any urinary symptoms and UA not convincing for UTI. She was found to have candidal vulvovaginitis on 10/14 and given one dose of fluconazole. Empiric  Zosyn was started on 10/14 as well. 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 Temperatures improved to 99.2 and 98.6 yesterday, 100.3 this morning. It is possible this is due to drug fever or lupus flare up. She says her flare ups prior to hospitalization involved swelling in her hands and feet, but she does not recall any associated fevers when asked. She was previously pancytopenic with WBCs b/w 1-2 at the beginning of this month. This was steadily improving off of Cellcept to 6.4 on 10/8, 9.8 on 10/12, and 8.7 on 10/14. It is difficult to assess if her white count is normalizing or is becoming elevated in this complex patient. -Appreciate PMR for taking care of [name] -Continue to monitor for fever -Continue Vancomycin with last dosing on 10/25, Continue Zosyn for now -Will need to consider steroid if suspicious for lupus flare up -CMP -f/u blood cultures and urine culture -If continued fever spikes, need to consider Infectious disease consult  Dispo: Disposition is deferred at this time, awaiting improvement of current medical problems.    The patient does have a current PCP (Lin Landsman, MD) and does need a hospital follow-up appointment after discharge.    LOS: 7 days   Zada Finders, MD 01/12/2015, 11:44 AM

## 2015-01-12 NOTE — Progress Notes (Signed)
Subjective/Complaints:  Left thigh tenderness mild, nauseated today    ROS- neg SOB, Constipation, pain, no sweats or chills, no pain at line sites  Objective: Vital Signs: Blood pressure 133/58, pulse 105, temperature 100.3 F (37.9 C), temperature source Oral, resp. rate 18, height 5' 3"  (1.6 m), weight 82.4 kg (181 lb 10.5 oz), last menstrual period 11/27/2014, SpO2 100 %. Dg Chest 2 View  01/11/2015  CLINICAL DATA:  Fever. EXAM: CHEST  2 VIEW COMPARISON:  November 13, 2014. FINDINGS: The heart size and mediastinal contours are within normal limits. No pneumothorax or pleural effusion is noted. Mild left basilar subsegmental atelectasis is noted. Right lung is clear. Right internal jugular catheter is noted with distal tip overlying expected position of the SVC. The visualized skeletal structures are unremarkable. IMPRESSION: Mild left basilar subsegmental atelectasis. Electronically Signed   By: Marijo Conception, M.D.   On: 01/11/2015 16:15   Results for orders placed or performed during the hospital encounter of 01/05/15 (from the past 72 hour(s))  CBC     Status: Abnormal   Collection Time: 01/09/15  9:45 AM  Result Value Ref Range   WBC 9.8 4.0 - 10.5 K/uL   RBC 3.42 (L) 3.87 - 5.11 MIL/uL   Hemoglobin 10.1 (L) 12.0 - 15.0 g/dL    Comment: POST TRANSFUSION SPECIMEN   HCT 31.2 (L) 36.0 - 46.0 %   MCV 91.2 78.0 - 100.0 fL   MCH 29.5 26.0 - 34.0 pg   MCHC 32.4 30.0 - 36.0 g/dL   RDW 17.4 (H) 11.5 - 15.5 %   Platelets 298 150 - 400 K/uL  Occult blood card to lab, stool RN will collect     Status: Abnormal   Collection Time: 01/09/15  2:52 PM  Result Value Ref Range   Fecal Occult Bld POSITIVE (A) NEGATIVE  Protime-INR     Status: Abnormal   Collection Time: 01/10/15  4:45 AM  Result Value Ref Range   Prothrombin Time 27.4 (H) 11.6 - 15.2 seconds   INR 2.59 (H) 0.00 - 1.49  CBC     Status: Abnormal   Collection Time: 01/10/15  4:45 AM  Result Value Ref Range   WBC 7.6 4.0  - 10.5 K/uL   RBC 3.18 (L) 3.87 - 5.11 MIL/uL   Hemoglobin 9.6 (L) 12.0 - 15.0 g/dL   HCT 29.7 (L) 36.0 - 46.0 %   MCV 93.4 78.0 - 100.0 fL   MCH 30.2 26.0 - 34.0 pg   MCHC 32.3 30.0 - 36.0 g/dL   RDW 17.1 (H) 11.5 - 15.5 %   Platelets 306 150 - 400 K/uL  Renal function panel     Status: Abnormal   Collection Time: 01/10/15  4:55 AM  Result Value Ref Range   Sodium 135 135 - 145 mmol/L   Potassium 3.9 3.5 - 5.1 mmol/L   Chloride 97 (L) 101 - 111 mmol/L   CO2 29 22 - 32 mmol/L   Glucose, Bld 78 65 - 99 mg/dL   BUN 20 6 - 20 mg/dL   Creatinine, Ser 5.56 (H) 0.44 - 1.00 mg/dL   Calcium 8.3 (L) 8.9 - 10.3 mg/dL   Phosphorus 2.2 (L) 2.5 - 4.6 mg/dL   Albumin 1.9 (L) 3.5 - 5.0 g/dL   GFR calc non Af Amer 9 (L) >60 mL/min   GFR calc Af Amer 10 (L) >60 mL/min    Comment: (NOTE) The eGFR has been calculated using the CKD EPI equation.  This calculation has not been validated in all clinical situations. eGFR's persistently <60 mL/min signify possible Chronic Kidney Disease.    Anion gap 9 5 - 15  Protime-INR     Status: Abnormal   Collection Time: 01/11/15  5:15 AM  Result Value Ref Range   Prothrombin Time 30.8 (H) 11.6 - 15.2 seconds   INR 3.03 (H) 0.00 - 1.49  CBC     Status: Abnormal   Collection Time: 01/11/15  5:15 AM  Result Value Ref Range   WBC 8.7 4.0 - 10.5 K/uL   RBC 3.15 (L) 3.87 - 5.11 MIL/uL   Hemoglobin 9.5 (L) 12.0 - 15.0 g/dL   HCT 29.7 (L) 36.0 - 46.0 %   MCV 94.3 78.0 - 100.0 fL   MCH 30.2 26.0 - 34.0 pg   MCHC 32.0 30.0 - 36.0 g/dL   RDW 17.3 (H) 11.5 - 15.5 %   Platelets 289 150 - 400 K/uL  Renal function panel     Status: Abnormal   Collection Time: 01/11/15  5:15 AM  Result Value Ref Range   Sodium 136 135 - 145 mmol/L   Potassium 4.3 3.5 - 5.1 mmol/L   Chloride 98 (L) 101 - 111 mmol/L   CO2 28 22 - 32 mmol/L   Glucose, Bld 76 65 - 99 mg/dL   BUN 13 6 - 20 mg/dL   Creatinine, Ser 4.26 (H) 0.44 - 1.00 mg/dL   Calcium 7.8 (L) 8.9 - 10.3 mg/dL    Phosphorus 2.3 (L) 2.5 - 4.6 mg/dL   Albumin 2.1 (L) 3.5 - 5.0 g/dL   GFR calc non Af Amer 12 (L) >60 mL/min   GFR calc Af Amer 14 (L) >60 mL/min    Comment: (NOTE) The eGFR has been calculated using the CKD EPI equation. This calculation has not been validated in all clinical situations. eGFR's persistently <60 mL/min signify possible Chronic Kidney Disease.    Anion gap 10 5 - 15  Urinalysis, Routine w reflex microscopic (not at [facility])     Status: Abnormal   Collection Time: 01/11/15  4:35 PM  Result Value Ref Range   Color, Urine YELLOW YELLOW   APPearance CLOUDY (A) CLEAR   Specific Gravity, Urine 1.022 1.005 - 1.030   pH 6.5 5.0 - 8.0   Glucose, UA NEGATIVE NEGATIVE mg/dL   Hgb urine dipstick NEGATIVE NEGATIVE   Bilirubin Urine SMALL (A) NEGATIVE   Ketones, ur NEGATIVE NEGATIVE mg/dL   Protein, ur >300 (A) NEGATIVE mg/dL   Urobilinogen, UA 0.2 0.0 - 1.0 mg/dL   Nitrite NEGATIVE NEGATIVE   Leukocytes, UA TRACE (A) NEGATIVE  Urine microscopic-add on     Status: Abnormal   Collection Time: 01/11/15  4:35 PM  Result Value Ref Range   Squamous Epithelial / LPF RARE RARE   WBC, UA 3-6 <3 WBC/hpf   Bacteria, UA FEW (A) RARE  Protime-INR     Status: Abnormal   Collection Time: 01/12/15  4:46 AM  Result Value Ref Range   Prothrombin Time 36.6 (H) 11.6 - 15.2 seconds   INR 3.80 (H) 0.00 - 1.49     HEENT: normal Cardio: RRR and no murmurs Resp: CTA B/L and unlabored GI: BS positive and non tender Extremity:  Pulses positive and No Edema, bruising Left thigh around surgical site, tenderness lateral aspect of graft, good bruit Skin:   Intact Neuro: Alert/Oriented, Flat and Abnormal Motor 4-/5 LUE and LLE, 5- in RUE and RLE Musc/Skel:  Other  no pain with AROM in UE or LEs Gen NAD   Assessment/Plan: 1. Functional deficits secondary to Right MCA which require 3+ hours per day of interdisciplinary therapy in a comprehensive inpatient rehab setting. Physiatrist is providing  close team supervision and 24 hour management of active medical problems listed below. Physiatrist and rehab team continue to assess barriers to discharge/monitor patient progress toward functional and medical goals. Have enc pt to atempt therapy today as fever improving Appreciate VVS and IM consults FIM: Function - Bathing Position: Wheelchair/chair at sink Body parts bathed by patient: Right arm, Left arm, Chest, Abdomen, Front perineal area, Buttocks, Right upper leg, Left upper leg Body parts bathed by helper: Back Bathing not applicable: Left lower leg, Right lower leg Assist Level: Supervision or verbal cues  Function- Upper Body Dressing/Undressing What is the patient wearing?: Bra, Pull over shirt/dress Bra - Perfomed by patient: Thread/unthread right bra strap, Thread/unthread left bra strap Bra - Perfomed by helper: Hook/unhook bra (pull down sports bra) Pull over shirt/dress - Perfomed by patient: Thread/unthread right sleeve, Thread/unthread left sleeve, Put head through opening, Pull shirt over trunk Assist Level: Touching or steadying assistance(Pt > 75%) Set up : To obtain clothing/put away Function - Lower Body Dressing/Undressing What is the patient wearing?: Underwear, Non-skid slipper socks (skirt) Position: Wheelchair/chair at sink Underwear - Performed by patient: Thread/unthread right underwear leg, Thread/unthread left underwear leg, Pull underwear up/down (with use of reacher) Underwear - Performed by helper: Thread/unthread right underwear leg, Thread/unthread left underwear leg Non-skid slipper socks- Performed by helper: Don/doff right sock, Don/doff left sock Socks - Performed by helper: Don/doff right sock, Don/doff left sock Shoes - Performed by helper: Don/doff right shoe, Don/doff left shoe, Fasten right, Fasten left Assist for footwear: Dependant Assist for lower body dressing: Touching or steadying assistance (Pt > 75%)  Function - Toileting Toileting  activity did not occur: Safety/medical concerns Toileting steps completed by patient: Adjust clothing prior to toileting, Performs perineal hygiene, Adjust clothing after toileting Toileting steps completed by helper: Performs perineal hygiene Assist level: Supervision or verbal cues  Function - Air cabin crew transfer activity did not occur: Safety/medical concerns Toilet transfer assistive device: Elevated toilet seat/BSC over toilet, Grab bar Assist level to toilet: No Help, no cues, assistive device, takes more than a reasonable amount of time Assist level from toilet: No Help, no cues, assistive device, takes more than a reasonable amount of time Assist level to bedside commode (at bedside): Moderate assist (Pt 50 - 74%/lift or lower) Assist level from bedside commode (at bedside): Moderate assist (Pt 50 - 74%/lift or lower)  Function - Chair/bed transfer Chair/bed transfer activity did not occur: Safety/medical concerns Chair/bed transfer method: Stand pivot Chair/bed transfer assist level: Supervision or verbal cues Chair/bed transfer assistive device:  (rollator) Chair/bed transfer details: Verbal cues for technique  Function - Locomotion: Wheelchair Will patient use wheelchair at discharge?: No Type: Manual Max wheelchair distance: 200 Assist Level: Supervision or verbal cues Assist Level: Supervision or verbal cues Wheel 150 feet activity did not occur: Safety/medical concerns Assist Level: Touching or steadying assistance (Pt > 75%) Turns around,maneuvers to table,bed, and toilet,negotiates 3% grade,maneuvers on rugs and over doorsills: No Function - Locomotion: Ambulation Assistive device: Other (comment) (rollator) Max distance: 100 Assist level: Supervision or verbal cues Assist level: Supervision or verbal cues Assist level: Supervision or verbal cues Walk 150 feet activity did not occur: Safety/medical concerns Assist level: Supervision or verbal  cues Walk 10 feet on uneven surfaces  activity did not occur: Safety/medical concerns Assist level: Touching or steadying assistance (Pt > 75%) (ramp)  Function - Comprehension Comprehension: Auditory Comprehension assist level: Follows complex conversation/direction with no assist  Function - Expression Expression: Verbal Expression assist level: Expresses complex ideas: With no assist  Function - Social Interaction Social Interaction assist level: Interacts appropriately with others - No medications needed.  Function - Problem Solving Problem solving assist level: Solves complex problems: Recognizes & self-corrects  Function - Memory Memory assist level: Complete Independence: No helper Patient normally able to recall (first 3 days only): Location of own room, Staff names and faces, Current season, That he or she is in a hospital   Medical Problem List and Plan: 1. 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 DVT Prophylaxis/Anticoagulation: Coumadin per pharmacy protocol and no bridging required. Monitor for any bleeding episodes, INR therapeutic as of 10/11 3. Pain Management: Oxycodone and flexeril as needed. Monitor with increased mobility 4. Lupus nephritis with end-stage renal disease. Continue hemodialysis as advised. Permanent access graft placement 01/04/2015.Nephro following, no further pain around graft, WBC normal 10/13 5. Neuropsych: This patient is capable of making decisions on her own behalf. 6. Skin/Wound Care: Routine skin care 7. Fluids/Electrolytes/Nutrition: Strict I&O with follow-up chemistries, fair intake ~50% 8. Endocarditis of mitral valve. Unable to rule out infectious endocarditis as patient is immunosuppressed and leukopenic. Blood cultures negative. Plan vancomycin until 01/22/2015. 9. Hypertension. Norvasc 5 mg daily, Imdur or 30  mg daily, Toprol-XL 25 mg daily at bedtime. Monitor with increased mobility, 142/73 10. Pseudomonas urinary tract infection. Ceftazadime completed 01/03/2015, repeat UA  neg 11. Morbid obesity. Dietary follow-up 12. Sacral decubitus. Foam dressing change every 3 days as needed pressure relief, bed positioning, turn q2h 13. Hyperlipidemia. Lipitor 14.  Nausea and epigastric tenderness, no hx of ulcer pt pt, ? Could be related to zosyn or diflucan as new med, on PPI may need to increase to BID if this persists 15 FUO- per VVS, not graft related, IM consulted, no obvious course thus far but deferevescing on IV Zosyn in addition to vanc  LOS (Days) 7 A FACE TO FACE EVALUATION WAS PERFORMED  KIRSTEINS,ANDREW E 01/12/2015, 7:50 AM

## 2015-01-12 NOTE — Progress Notes (Signed)
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 Food Swelling    Red peppers    Patient Measurements: Height: 5\' 3"  (160 cm) Weight: 238 lb 1.6 oz (108 kg) (bed) IBW/kg (Calculated) : 52.4   Vital Signs: Temp: 100.9 F (38.3 C) (10/15 1532) Temp Source: Oral (10/15 1532) BP: 164/96 mmHg (10/15 1830) Pulse Rate: 131 (10/15 1830)  Labs:  Recent Labs  01/10/15 0445 01/10/15 0455 01/11/15 0515 01/12/15 0446 01/12/15 1010 01/12/15 1559  HGB 9.6*  --  9.5*  --   --  9.6*  HCT 29.7*  --  29.7*  --   --  30.7*  PLT 306  --  289  --   --  339  LABPROT 27.4*  --  30.8* 36.6*  --   --   INR 2.59*  --  3.03* 3.80*  --   --   CREATININE  --  5.56* 4.26*  --  6.53*  --     Estimated Creatinine Clearance: 13.8 mL/min (by C-G formula based on Cr of 6.53).   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   Hgb 9.6. Guaiac stool Positive. No active bleeding noted.  Goal of Therapy:  Monitor platelets by anticoagulation protocol: Yes   Plan:  - hold coumadin today -Daily INR, CBC q72h -Monitor S/Sx of bleeding, f/u GI workup.  Eudelia Bunch, Pharm.D. [phone] 01/12/2015 7:25 PM

## 2015-01-13 ENCOUNTER — Inpatient Hospital Stay (HOSPITAL_COMMUNITY): Payer: Medicaid Other | Admitting: Occupational Therapy

## 2015-01-13 LAB — URINE CULTURE: CULTURE: NO GROWTH

## 2015-01-13 LAB — PROTIME-INR
INR: 3.93 — ABNORMAL HIGH (ref 0.00–1.49)
Prothrombin Time: 37.5 s — ABNORMAL HIGH (ref 11.6–15.2)

## 2015-01-13 MED ORDER — ACETAMINOPHEN 325 MG PO TABS
650.0000 mg | ORAL_TABLET | Freq: Four times a day (QID) | ORAL | Status: DC | PRN
  Administered 2015-01-13 – 2015-01-17 (×2): 650 mg via ORAL
  Filled 2015-01-13 (×3): qty 2

## 2015-01-13 NOTE — Progress Notes (Signed)
01/13/15 1530 nursing Internal Medicine made aware of patient's temperature 102.6

## 2015-01-13 NOTE — Progress Notes (Signed)
Occupational Therapy Session Note  Patient Details  Name: [name] MRN: [id number] Date of Birth: 1975/04/26  Today's Date: 01/13/2015 OT Individual Time:  -    1000-1100  (60 min)      Short Term Goals: Week 1:  OT Short Term Goal 1 (Week 1): Pt. will bathe with supervision OT Short Term Goal 2 (Week 1): Pt will dress with supervision OT Short Term Goal 3 (Week 1): Pt will perform transfer to toilet with supervision OT Short Term Goal 4 (Week 1): Pt will do tub transfer with bench at min assist level    :     Skilled Therapeutic Interventions/Progress Updates:      Pt.lying in bed. Pt reportedfair sleep and 3/10 on pain in back.  Pt reported she ambulated with nursing in early morning to bathroom with Rollator walker for first time.  She did not use the bed pan.    Went from supine>sit>RW with min to SBA. Sat EOB and needed assist with straightening footies.  Ambulated with rollator walker to Bathroom and transferred to 3n1 over toilet with SBA. SEE Function.   Pt ambulated to sink to perfom bathing and dressing.  Did standing with SBA at sink for peri care.  Took seated rest break for 3 minutes.. Pt. Back to room and remained in wc with all needs in reach. .    Therapy Documentation Precautions:  Precautions Precautions: Fall Precaution Comments: Pt's femoral cath is tunneled therefore she is able to get OOB. Restrictions Weight Bearing Restrictions: No General:   Vital Signs: Therapy Vitals Temp: 98.3 F (36.8 C) Temp Source: Oral Pulse Rate: (!) 102 Resp: 18 BP: (!) 111/57 mmHg Patient Position (if appropriate): Lying Oxygen Therapy SpO2: 97 % O2 Device: Not Delivered Pain:/10 back              See Function Navigator for Current Functional Status.   Therapy/Group: Individual Therapy  Lisa Roca 01/13/2015, 7:56 AM

## 2015-01-13 NOTE — Progress Notes (Signed)
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 Food Swelling    Red peppers    Patient Measurements: Height: 5\' 3"  (160 cm) Weight: 234 lb 9.1 oz (106.4 kg) IBW/kg (Calculated) : 52.4   Vital Signs: Temp: 98.3 F (36.8 C) (10/16 0510) Temp Source: Oral (10/16 0510) BP: 111/57 mmHg (10/16 0510) Pulse Rate: 102 (10/16 0510)  Labs:  Recent Labs  01/11/15 0515 01/12/15 0446 01/12/15 1010 01/12/15 1559 01/13/15 0415  HGB 9.5*  --   --  9.6*  --   HCT 29.7*  --   --  30.7*  --   PLT 289  --   --  339  --   LABPROT 30.8* 36.6*  --   --  37.5*  INR 3.03* 3.80*  --   --  3.93*  CREATININE 4.26*  --  6.53*  --   --     Estimated Creatinine Clearance: 13.6 mL/min (by C-G formula based on Cr of 6.53).   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  Hgb 9.6. Guaiac stool Positive. No active bleeding noted.  Goal of Therapy:  INR 2.0 -2.5 Monitor platelets by anticoagulation protocol: Yes   Plan:  - hold coumadin today -Daily INR, CBC q72h -Monitor S/Sx of bleeding, f/u GI workup.  [name], PharmD Clinical Pharmacy Resident Pager # [phone] 01/13/2015 9:07 AM

## 2015-01-13 NOTE — Progress Notes (Signed)
01/13/15 1704 nursing Phlebotomist came to draw 1 set of blood culture patient refuses to be stuck for another blood culture.

## 2015-01-13 NOTE — Progress Notes (Signed)
01/13/15 1110 nursing Orders noted for blood cultures x2 if temperature above 100 per Dr. Arty Baumgartner.

## 2015-01-13 NOTE — Progress Notes (Signed)
01/13/15 1101 nursing Patient has poor appetite claims  " I don't like the choices for renal diet." notified Dr. Arty Baumgartner  new orders noted.

## 2015-01-13 NOTE — Progress Notes (Signed)
Subjective: [name] is a 39yo F with PMH SLE, HTN, SVT who we were consulted for to evaluation of fever of unknown origin. Overnight, she spiked a fever, Tmax 101.2, defervesced with one dose of tylenol. She was otherwise hemodynamically stable and asymptomatic throughout. She says she has had a dry, nonproductive cough for the last 2 days and mild nausea, but no vomiting. She says that her L leg graft area gets painful after her PT session, but otherwise feels fine and has no other issues.  Objective: Vital signs in last 24 hours: Filed Vitals:   01/12/15 2010 01/12/15 2059 01/12/15 2307 01/13/15 0510  BP: 165/90 130/77  111/57  Pulse: 125 125  102  Temp:  101.2 F (38.4 C) 98.8 F (37.1 C) 98.3 F (36.8 C)  TempSrc:  Oral Oral Oral  Resp:  18  18  Height:      Weight: 235 lb 14.3 oz (107 kg)   234 lb 9.1 oz (106.4 kg)  SpO2:  100%  97%   Weight change: 56 lb 7 oz (25.6 kg)  Intake/Output Summary (Last 24 hours) at 01/13/15 0930 Last data filed at 01/12/15 1948  Gross per 24 hour  Intake    360 ml  Output    822 ml  Net   -462 ml   BP 111/57 mmHg  Pulse 102  Temp(Src) 98.3 F (36.8 C) (Oral)  Resp 18  Ht 5\' 3"  (1.6 m)  Wt 234 lb 9.1 oz (106.4 kg)  BMI 41.56 kg/m2  SpO2 97%  LMP 11/27/2014  General Appearance:    Alert, cooperative, no distress, appears stated age   Heart:    Regular rate and rhythm, S1 and S2 normal, S4 heard best at left sternal border. No S3 heard. No murmurs or rubs heard.  Abdomen:     Soft, non-tender, bowel sounds active all four quadrants,    no masses, no organomegaly  Extremities:   Extremities normal, atraumatic, no cyanosis or edema. Intact left thigh permanent graph with good thrill and some bruising in the surrounding region. There is no bleeding, increased warmth, only mild tenderness, without any obvious signs of infection. Right thigh temporary cath c/d/i  Pulses:   2+ and symmetric all extremities  Skin:   Skin color, texture,  turgor normal, no rashes or lesions  Neurologic:   CNII-XII intact, normal strength, sensation and reflexes    throughout   Lab Results: Basic Metabolic Panel:  Recent Labs Lab 01/10/15 0455 01/11/15 0515 01/12/15 1010  NA 135 136 134*  K 3.9 4.3 4.1  CL 97* 98* 96*  CO2 29 28 29   GLUCOSE 78 76 91  BUN 20 13 25*  CREATININE 5.56* 4.26* 6.53*  CALCIUM 8.3* 7.8* 8.2*  PHOS 2.2* 2.3*  --    Liver Function Tests:  Recent Labs Lab 01/11/15 0515 01/12/15 1010  AST  --  41  ALT  --  12*  ALKPHOS  --  52  BILITOT  --  0.8  PROT  --  4.9*  ALBUMIN 2.1* 1.9*   CBC:  Recent Labs Lab 01/11/15 0515 01/12/15 1559  WBC 8.7 8.8  HGB 9.5* 9.6*  HCT 29.7* 30.7*  MCV 94.3 93.9  PLT 289 339   Coagulation:  Recent Labs Lab 01/10/15 0445 01/11/15 0515 01/12/15 0446 01/13/15 0415  LABPROT 27.4* 30.8* 36.6* 37.5*  INR 2.59* 3.03* 3.80* 3.93*   Assessment/Plan: Active Problems:   Right middle cerebral artery stroke (HCC)   Left  hemiparesis ([place])   Post-operative pain   Adjustment disorder with depressed mood Fever of unknown origin: Patient had oral temperatures of 101.9 on 10/13, 102.9 and 103.2 on 10/14, spiked again to 101.2 on 10/16. CXR on 10/14 showed mild left basilar subsegmental atelectasis, no pleural effusion or consolidation noted. UA negative for UTI and no urinary symptoms. On 10/14, was found to have candidal vulvovaginitis, with one dose of fluconazole given. Patient is on empiric zosyn as well as continuing vancomycin for endocarditis coverage. Her continued spiking fevers are possibly of infectious cause without obvious source, however lupus flares and drug-induced fever are possible considerations. She says her flare ups prior to hospitalization involved swelling in her hands and feet, but she does not recall any associated fevers when asked. She was previously pancytopenic with WBCs b/w 1-2 at the beginning of this month. WBC up to 8.8 today, will continue  to trend to see if this is resolution of her leukopenia or if it is a leukocytosis from inflammatory/infectious process. -Continue to monitor for fever -PRN acetaminophen -Continue vancomycin (last dose anticipated 10/25) and zosyn -Continue steroid, possible lupus flare up -F/u BCx and UCx -Consider ID consult if patient continues to spike overnight.  Dispo: Disposition is deferred at this time, awaiting improvement of current medical problems.    The patient does have a current PCP Lin Landsman, MD) and does need an [facility] hospital follow-up appointment after discharge.   LOS: 8 days   Norval Gable, MD 01/13/2015, 9:30 AM

## 2015-01-13 NOTE — Progress Notes (Signed)
Subjective/Complaints: Doesn't like HD diet Spiked temp to 101 in HD yesterday   ROS- neg SOB, Constipation, pain, no sweats or chills, no pain at line sites  Objective: Vital Signs: Blood pressure 111/57, pulse 102, temperature 98.3 F (36.8 C), temperature source Oral, resp. rate 18, height _0  (1.6 m), weight 106.4 kg (234 lb 9.1 oz), last menstrual period 11/27/2014, SpO2 97 %. Dg Chest 2 View  01/11/2015  CLINICAL DATA:  Fever. EXAM: CHEST  2 VIEW COMPARISON:  November 13, 2014. FINDINGS: The heart size and mediastinal contours are within normal limits. No pneumothorax or pleural effusion is noted. Mild left basilar subsegmental atelectasis is noted. Right lung is clear. Right internal jugular catheter is noted with distal tip overlying expected position of the SVC. The visualized skeletal structures are unremarkable. IMPRESSION: Mild left basilar subsegmental atelectasis. Electronically Signed   By: Marijo Conception, M.D.   On: 01/11/2015 16:15   Results for orders placed or performed during the hospital encounter of 01/05/15 (from the past 72 hour(s))  Protime-INR     Status: Abnormal   Collection Time: 01/11/15  5:15 AM  Result Value Ref Range   Prothrombin Time 30.8 (H) 11.6 - 15.2 seconds   INR 3.03 (H) 0.00 - 1.49  CBC     Status: Abnormal   Collection Time: 01/11/15  5:15 AM  Result Value Ref Range   WBC 8.7 4.0 - 10.5 K/uL   RBC 3.15 (L) 3.87 - 5.11 MIL/uL   Hemoglobin 9.5 (L) 12.0 - 15.0 g/dL   HCT 29.7 (L) 36.0 - 46.0 %   MCV 94.3 78.0 - 100.0 fL   MCH 30.2 26.0 - 34.0 pg   MCHC 32.0 30.0 - 36.0 g/dL   RDW 17.3 (H) 11.5 - 15.5 %   Platelets 289 150 - 400 K/uL  Renal function panel     Status: Abnormal   Collection Time: 01/11/15  5:15 AM  Result Value Ref Range   Sodium 136 135 - 145 mmol/L   Potassium 4.3 3.5 - 5.1 mmol/L   Chloride 98 (L) 101 - 111 mmol/L   CO2 28 22 - 32 mmol/L   Glucose, Bld 76 65 - 99 mg/dL   BUN 13 6 - 20 mg/dL   Creatinine, Ser 4.26  (H) 0.44 - 1.00 mg/dL   Calcium 7.8 (L) 8.9 - 10.3 mg/dL   Phosphorus 2.3 (L) 2.5 - 4.6 mg/dL   Albumin 2.1 (L) 3.5 - 5.0 g/dL   GFR calc non Af Amer 12 (L) >60 mL/min   GFR calc Af Amer 14 (L) >60 mL/min    Comment: (NOTE) The eGFR has been calculated using the CKD EPI equation. This calculation has not been validated in all clinical situations. eGFR's persistently <60 mL/min signify possible Chronic Kidney Disease.    Anion gap 10 5 - 15  Urinalysis, Routine w reflex microscopic (not at [facility])     Status: Abnormal   Collection Time: 01/11/15  4:35 PM  Result Value Ref Range   Color, Urine YELLOW YELLOW   APPearance CLOUDY (A) CLEAR   Specific Gravity, Urine 1.022 1.005 - 1.030   pH 6.5 5.0 - 8.0   Glucose, UA NEGATIVE NEGATIVE mg/dL   Hgb urine dipstick NEGATIVE NEGATIVE   Bilirubin Urine SMALL (A) NEGATIVE   Ketones, ur NEGATIVE NEGATIVE mg/dL   Protein, ur >300 (A) NEGATIVE mg/dL   Urobilinogen, UA 0.2 0.0 - 1.0 mg/dL   Nitrite NEGATIVE NEGATIVE   Leukocytes, UA  TRACE (A) NEGATIVE  Urine culture     Status: None (Preliminary result)   Collection Time: 01/11/15  4:35 PM  Result Value Ref Range   Specimen Description URINE, CATHETERIZED    Special Requests NONE    Culture NO GROWTH < 24 HOURS    Report Status PENDING   Urine microscopic-add on     Status: Abnormal   Collection Time: 01/11/15  4:35 PM  Result Value Ref Range   Squamous Epithelial / LPF RARE RARE   WBC, UA 3-6 <3 WBC/hpf   Bacteria, UA FEW (A) RARE  Culture, blood (routine x 2)     Status: None (Preliminary result)   Collection Time: 01/11/15  4:55 PM  Result Value Ref Range   Specimen Description BLOOD BLOOD LEFT HAND    Special Requests BOTTLES DRAWN AEROBIC ONLY 5CC    Culture NO GROWTH < 24 HOURS    Report Status PENDING   Culture, blood (routine x 2)     Status: None (Preliminary result)   Collection Time: 01/11/15  5:06 PM  Result Value Ref Range   Specimen Description BLOOD BLOOD RIGHT HAND     Special Requests BOTTLES DRAWN AEROBIC ONLY 5CC    Culture NO GROWTH < 24 HOURS    Report Status PENDING   Protime-INR     Status: Abnormal   Collection Time: 01/12/15  4:46 AM  Result Value Ref Range   Prothrombin Time 36.6 (H) 11.6 - 15.2 seconds   INR 3.80 (H) 0.00 - 1.49  Comprehensive metabolic panel     Status: Abnormal   Collection Time: 01/12/15 10:10 AM  Result Value Ref Range   Sodium 134 (L) 135 - 145 mmol/L   Potassium 4.1 3.5 - 5.1 mmol/L   Chloride 96 (L) 101 - 111 mmol/L   CO2 29 22 - 32 mmol/L   Glucose, Bld 91 65 - 99 mg/dL   BUN 25 (H) 6 - 20 mg/dL   Creatinine, Ser 6.53 (H) 0.44 - 1.00 mg/dL    Comment: DELTA CHECK NOTED   Calcium 8.2 (L) 8.9 - 10.3 mg/dL   Total Protein 4.9 (L) 6.5 - 8.1 g/dL   Albumin 1.9 (L) 3.5 - 5.0 g/dL   AST 41 15 - 41 U/L   ALT 12 (L) 14 - 54 U/L   Alkaline Phosphatase 52 38 - 126 U/L   Total Bilirubin 0.8 0.3 - 1.2 mg/dL   GFR calc non Af Amer 7 (L) >60 mL/min   GFR calc Af Amer 8 (L) >60 mL/min    Comment: (NOTE) The eGFR has been calculated using the CKD EPI equation. This calculation has not been validated in all clinical situations. eGFR's persistently <60 mL/min signify possible Chronic Kidney Disease.    Anion gap 9 5 - 15  CBC     Status: Abnormal   Collection Time: 01/12/15  3:59 PM  Result Value Ref Range   WBC 8.8 4.0 - 10.5 K/uL   RBC 3.27 (L) 3.87 - 5.11 MIL/uL   Hemoglobin 9.6 (L) 12.0 - 15.0 g/dL   HCT 30.7 (L) 36.0 - 46.0 %   MCV 93.9 78.0 - 100.0 fL   MCH 29.4 26.0 - 34.0 pg   MCHC 31.3 30.0 - 36.0 g/dL   RDW 17.2 (H) 11.5 - 15.5 %   Platelets 339 150 - 400 K/uL  Protime-INR     Status: Abnormal   Collection Time: 01/13/15  4:15 AM  Result Value Ref Range  Prothrombin Time 37.5 (H) 11.6 - 15.2 seconds   INR 3.93 (H) 0.00 - 1.49     HEENT: normal Cardio: RRR and no murmurs Resp: CTA B/L and unlabored GI: BS positive and non tender Extremity:  Pulses positive and No Edema, bruising Left thigh  around surgical site, tenderness lateral aspect of graft, good bruit Skin:   Intact Neuro: Alert/Oriented, Flat and Abnormal Motor 4-/5 LUE and LLE, 5- in RUE and RLE Musc/Skel:  Other no pain with AROM in UE or LEs Gen NAD   Assessment/Plan: 1. Functional deficits secondary to Right MCA which require 3+ hours per day of interdisciplinary therapy in a comprehensive inpatient rehab setting. Physiatrist is providing close team supervision and 24 hour management of active medical problems listed below. Physiatrist and rehab team continue to assess barriers to discharge/monitor patient progress toward functional and medical goals.  Appreciate IM consult FIM: Function - Bathing Position: Wheelchair/chair at sink Body parts bathed by patient: Right arm, Left arm, Chest, Abdomen, Front perineal area, Buttocks, Right upper leg, Left upper leg Body parts bathed by helper: Back Bathing not applicable: Left lower leg, Right lower leg Assist Level: Supervision or verbal cues  Function- Upper Body Dressing/Undressing What is the patient wearing?: Bra, Pull over shirt/dress Bra - Perfomed by patient: Thread/unthread right bra strap, Thread/unthread left bra strap Bra - Perfomed by helper: Hook/unhook bra (pull down sports bra) Pull over shirt/dress - Perfomed by patient: Thread/unthread right sleeve, Thread/unthread left sleeve, Put head through opening, Pull shirt over trunk Assist Level: Touching or steadying assistance(Pt > 75%) Set up : To obtain clothing/put away Function - Lower Body Dressing/Undressing What is the patient wearing?: Underwear, Non-skid slipper socks (skirt) Position: Wheelchair/chair at sink Underwear - Performed by patient: Thread/unthread right underwear leg, Thread/unthread left underwear leg, Pull underwear up/down (with use of reacher) Underwear - Performed by helper: Thread/unthread right underwear leg, Thread/unthread left underwear leg Non-skid slipper socks-  Performed by helper: Don/doff right sock, Don/doff left sock Socks - Performed by helper: Don/doff right sock, Don/doff left sock Shoes - Performed by helper: Don/doff right shoe, Don/doff left shoe, Fasten right, Fasten left Assist for footwear: Dependant Assist for lower body dressing: Touching or steadying assistance (Pt > 75%)  Function - Toileting Toileting activity did not occur: Safety/medical concerns Toileting steps completed by patient: Adjust clothing prior to toileting, Performs perineal hygiene, Adjust clothing after toileting Toileting steps completed by helper: Performs perineal hygiene Assist level: Supervision or verbal cues  Function - Air cabin crew transfer activity did not occur: Safety/medical concerns Toilet transfer assistive device: Elevated toilet seat/BSC over toilet, Grab bar Assist level to toilet: Supervision or verbal cues Assist level from toilet: Supervision or verbal cues Assist level to bedside commode (at bedside): Moderate assist (Pt 50 - 74%/lift or lower) Assist level from bedside commode (at bedside): Moderate assist (Pt 50 - 74%/lift or lower)  Function - Chair/bed transfer Chair/bed transfer activity did not occur: Safety/medical concerns Chair/bed transfer method: Stand pivot Chair/bed transfer assist level: Supervision or verbal cues Chair/bed transfer assistive device:  (rollator) Chair/bed transfer details: Verbal cues for technique  Function - Locomotion: Wheelchair Will patient use wheelchair at discharge?: No Type: Manual Max wheelchair distance: 200 Assist Level: Supervision or verbal cues Assist Level: Supervision or verbal cues Wheel 150 feet activity did not occur: Safety/medical concerns Assist Level: Touching or steadying assistance (Pt > 75%) Turns around,maneuvers to table,bed, and toilet,negotiates 3% grade,maneuvers on rugs and over doorsills: No Function - Locomotion:  Ambulation Assistive device: Other (comment)  (rollator) Max distance: 100 Assist level: Supervision or verbal cues Assist level: Supervision or verbal cues Assist level: Supervision or verbal cues Walk 150 feet activity did not occur: Safety/medical concerns Assist level: Supervision or verbal cues Walk 10 feet on uneven surfaces activity did not occur: Safety/medical concerns Assist level: Touching or steadying assistance (Pt > 75%) (ramp)  Function - Comprehension Comprehension: Auditory Comprehension assist level: Follows complex conversation/direction with no assist  Function - Expression Expression: Verbal Expression assist level: Expresses complex ideas: With no assist  Function - Social Interaction Social Interaction assist level: Interacts appropriately with others - No medications needed.  Function - Problem Solving Problem solving assist level: Solves complex problems: Recognizes & self-corrects  Function - Memory Memory assist level: Complete Independence: No helper Patient normally able to recall (first 3 days only): Location of own room, Staff names and faces, Current season, That he or she is in a hospital   Medical Problem List and Plan: 1. 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 DVT Prophylaxis/Anticoagulation: Coumadin per pharmacy protocol and no bridging required. Monitor for any bleeding episodes, INR therapeutic as of 10/11 3. Pain Management: Oxycodone and flexeril as needed. Monitor with increased mobility 4. Lupus nephritis with end-stage renal disease. Continue hemodialysis as advised. Permanent access graft placement 01/04/2015.Nephro following, no further pain around graft, WBC normal 10/13 5. Neuropsych: This patient is capable of making decisions on her own behalf. 6. Skin/Wound Care: Routine skin care 7. Fluids/Electrolytes/Nutrition: Strict I&O with follow-up  chemistries, variable but reduced intake 0-50%, doesn't like renal diet- pt will discuss with nephro 8. Endocarditis of mitral valve. Unable to rule out infectious endocarditis as patient is immunosuppressed and leukopenic. Blood cultures negative. Plan vancomycin until 01/22/2015. 9. Hypertension. Norvasc 5 mg daily, Imdur or 30 mg daily, Toprol-XL 25 mg daily at bedtime. Monitor with increased mobility, 111/57 10. Pseudomonas urinary tract infection. Ceftazadime completed 01/03/2015, repeat UA  Neg, Cx neg 11. Morbid obesity. Dietary follow-up 12. Sacral decubitus. Foam dressing change every 3 days as needed pressure relief, bed positioning, turn q2h, wound healing will be delayed with poor intake 13. Hyperlipidemia. Lipitor 14.  Nausea and epigastric tenderness, no hx of ulcer pt pt, ? Could be related to zosyn or diflucan as new med, on PPI  increase to BID  15 FUO- per VVS, not graft related, IM consulted, no obvious course thus far but deferevescing on IV Zosyn in addition to vanc  16.  Depression mood- appreciate neuropsych eval, pt declines medical treatment at this time  LOS (Days) 8 A FACE TO FACE EVALUATION WAS PERFORMED  Kyllie Pettijohn E 01/13/2015, 7:31 AM

## 2015-01-14 ENCOUNTER — Inpatient Hospital Stay (HOSPITAL_COMMUNITY): Payer: Medicaid Other

## 2015-01-14 ENCOUNTER — Inpatient Hospital Stay (HOSPITAL_COMMUNITY): Payer: Medicaid Other | Admitting: Occupational Therapy

## 2015-01-14 ENCOUNTER — Inpatient Hospital Stay (HOSPITAL_COMMUNITY): Payer: Medicaid Other | Admitting: Physical Therapy

## 2015-01-14 DIAGNOSIS — M7989 Other specified soft tissue disorders: Secondary | ICD-10-CM

## 2015-01-14 DIAGNOSIS — M79669 Pain in unspecified lower leg: Secondary | ICD-10-CM

## 2015-01-14 DIAGNOSIS — I639 Cerebral infarction, unspecified: Secondary | ICD-10-CM

## 2015-01-14 LAB — CBC
HCT: 28 % — ABNORMAL LOW (ref 36.0–46.0)
HEMOGLOBIN: 8.9 g/dL — AB (ref 12.0–15.0)
MCH: 29.5 pg (ref 26.0–34.0)
MCHC: 31.8 g/dL (ref 30.0–36.0)
MCV: 92.7 fL (ref 78.0–100.0)
PLATELETS: 356 10*3/uL (ref 150–400)
RBC: 3.02 MIL/uL — AB (ref 3.87–5.11)
RDW: 17.1 % — ABNORMAL HIGH (ref 11.5–15.5)
WBC: 5.7 10*3/uL (ref 4.0–10.5)

## 2015-01-14 LAB — PROCALCITONIN: PROCALCITONIN: 3.96 ng/mL

## 2015-01-14 LAB — SEDIMENTATION RATE: Sed Rate: 26 mm/hr — ABNORMAL HIGH (ref 0–22)

## 2015-01-14 LAB — PROTIME-INR
INR: 4.64 — AB (ref 0.00–1.49)
PROTHROMBIN TIME: 42.5 s — AB (ref 11.6–15.2)

## 2015-01-14 LAB — C-REACTIVE PROTEIN: CRP: 6.1 mg/dL — ABNORMAL HIGH (ref <1.0)

## 2015-01-14 LAB — PREGNANCY, URINE: Preg Test, Ur: NEGATIVE

## 2015-01-14 MED ORDER — IOHEXOL 300 MG/ML  SOLN
25.0000 mL | INTRAMUSCULAR | Status: AC
  Administered 2015-01-14 (×2): 25 mL via ORAL

## 2015-01-14 MED ORDER — IOHEXOL 300 MG/ML  SOLN: 25.0000 mL | INTRAMUSCULAR | Status: AC

## 2015-01-14 MED ORDER — BOOST / RESOURCE BREEZE PO LIQD
1.0000 | Freq: Three times a day (TID) | ORAL | Status: DC
  Administered 2015-01-15: 1 via ORAL

## 2015-01-14 NOTE — Progress Notes (Signed)
Occupational Therapy Weekly Progress Note  Patient Details  Name: [name] MRN: [id number] Date of Birth: 10-15-1975  Beginning of progress report period: January 06, 2015 End of progress report period: January 14, 2015  Today's Date: 01/14/2015 OT Individual Time: 8563-1497 OT Individual Time Calculation (min): 60 min    Patient has met 3 of 4 short term goals.  Pt is making steady progress towards goals.  She is currently at a supervision level for basic transfers with Rollator as well as UB bathing and dressing, pt continues to require assist with LB dressing with donning socks and shoes.  Educated on use of AE to assist with LB dressing to increase independence and conserve energy.  Pt's level of participation and activity tolerance fluctuates based on nausea and pain, therefore various education strategies and use of AE has been introduced.  Pt reluctant to complete bathing at shower level, due to concern of infection with HD port and PICC line - therefore performing simulated tub transfer education.    Patient continues to demonstrate the following deficits: decreased activity tolerance, pain, impaired standing balance and balance reactions and therefore will continue to benefit from skilled OT intervention to enhance overall performance with BADL, iADL and Reduce care partner burden.  Patient progressing toward long term goals..  Continue plan of care.  OT Short Term Goals Week 1:  OT Short Term Goal 1 (Week 1): Pt. will bathe with supervision OT Short Term Goal 1 - Progress (Week 1): Met OT Short Term Goal 2 (Week 1): Pt will dress with supervision OT Short Term Goal 2 - Progress (Week 1): Partly met OT Short Term Goal 3 (Week 1): Pt will perform transfer to toilet with supervision OT Short Term Goal 3 - Progress (Week 1): Met OT Short Term Goal 4 (Week 1): Pt will do tub transfer with bench at min assist level OT Short Term Goal 4 - Progress (Week 1): Met Week 2:  OT Short  Term Goal 1 (Week 2): STG = LTGs due to remaining LOS  Skilled Therapeutic Interventions/Progress Updates:    ADL retraining with focus on increased independence with self-care tasks.  Pt required increased time and encouragement to participate in any functional task.  Pt utilized reacher with LB dressing, requiring mod cues and hand over hand assist to correctly use reacher with donning underwear.  Pt able to recall hemi-dressing technique when questioned but not to apply it during functional tasks.  Pt required multiple rest breaks, reporting feeling nauseous and "just not feeling well".  Returned to bed at end of session and terminated session 15 mins early due to nausea.  Therapy Documentation Precautions:  Precautions Precautions: Fall Precaution Comments: Pt's femoral cath is tunneled therefore she is able to get OOB. Restrictions Weight Bearing Restrictions: No General: General OT Amount of Missed Time: 15 Minutes Vital Signs: Therapy Vitals Temp: 98.6 F (37 C) Temp Source: Oral Pain: Pain Assessment Pain Assessment: No/denies pain Pain Score: 0-No pain  See Function Navigator for Current Functional Status.   Therapy/Group: Individual Therapy  [name] 01/14/2015, 11:26 AM

## 2015-01-14 NOTE — Progress Notes (Signed)
Physical Therapy Note  Patient Details  Name: Rossi Silvestro MRN: [id number] Date of Birth: [birth year] Today's Date: 01/14/2015    Attempted to see patient to make up missed time after pt had >1 hour rest break from previous session. Pt continues to decline, stating she has a fever now and does not feel well enough to participate. Pt left with NA present.   Benjiman Core Tygielski 01/14/2015, 3:09 PM

## 2015-01-14 NOTE — Progress Notes (Signed)
*  PRELIMINARY RESULTS* Vascular Ultrasound Lower extremity venous duplex has been completed.  Preliminary findings: No DVT noted in visualized veins. Technically limited evaluation of left femoral vein due to shadowing from overlying dialysis access.   Landry Mellow, RDMS, RVT  01/14/2015, 6:12 PM

## 2015-01-14 NOTE — Progress Notes (Signed)
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 Food Swelling    Red peppers    Patient Measurements: Height: 5\' 3"  (160 cm) Weight: 246 lb 14.6 oz (112 kg) (air mattress. ) IBW/kg (Calculated) : 52.4   Vital Signs: Temp: 98.6 F (37 C) (10/17 1100) Temp Source: Oral (10/17 1100) BP: 136/75 mmHg (10/17 0522) Pulse Rate: 97 (10/17 0522)  Labs:  Recent Labs  01/12/15 0446 01/12/15 1010 01/12/15 1559 01/13/15 0415 01/14/15 0436  HGB  --   --  9.6*  --  8.9*  HCT  --   --  30.7*  --  28.0*  PLT  --   --  339  --  356  LABPROT 36.6*  --   --  37.5* 42.5*  INR 3.80*  --   --  3.93* 4.64*  CREATININE  --  6.53*  --   --   --     Estimated Creatinine Clearance: 14.1 mL/min (by C-G formula based on Cr of 6.53).   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 Guaiac stool Positive. No active bleeding noted. Poor po intake.   ID: endocarditis and pseudomonas UTI. TEE 9/27 showed MV vegetation consistent with endocarditis, cannot r/o infectious endocarditis, so continue vanc though 10/25. Spiking fever for the past 4 days, but wbc wnl. CRP/RSR/PCT all elevated on 10/17, loose stool start today, Lupus flare vs. HD line infection vs. C-diff? Might consult ID  10/8 preHD vanc = 7 -> gave 2g after HD on 10/8.   Vancomycin 9/27>10/1, 10/3>> want to continue until 10/25  Zosyn 10/14 >>  Ceftriaxone 9/5>>9/6, 9/7>>9/8  Cipro 9/6>>9/7  Cephalexin 9/8>>9/12  Cefuroxime>9/26 x 1 for AVG surgery  Cefazolin 10/1>>10/3  Ceftazidime 10/3>>10/6   9/5 Urine - K pneumo susceptible to CTX/Cefazolin  9/26 BC x 2 - ngF  9/27 BC x 2 - ngF  10/1 Urine - pseudomonas (I to cipro)  10/14 Urine - Neg 10/14  Blood x 2 - NGTD 10/16 Blood - ngtd 10/17 C-diff 10/27 Blood x2 -     Goal of Therapy:  INR 2.0 -2.5 Pre-HD vancomycin level 15-25 Monitor platelets by anticoagulation protocol: Yes   Plan:  -Vancomycin 1250 mg IV HD-TThSa -Monitor HD schedule and tolerance  -hold coumadin today -Daily PT/INR -Will consider recheck pre-HD level on 10/18  Maryanna Shape, PharmD, BCPS  Clinical Pharmacist  Pager: [phone]   01/14/2015 2:10 PM

## 2015-01-14 NOTE — Consult Note (Signed)
January 14, 2015 Pharmacy  Pharmacy Students rounding with [facility] Service. We were asked to investigate possible causes of drug fever, as well as the temporal association between those drugs and the development of drug fever.   The patient has been on ceftriaxone (9/5-9/7), cephalexin (9/8-9/11), cefazolin (9/9, 9/21, 10/1, 10/2), cefuroxime (9/20, 10/7), ceftazidime (10/3-10/4), and most recently piperacillin-tazobactam. The piperacillin-tazobactam was started empirically on 10/14 with vancomycin for a fever of unknown origin (FUO). She is now on day four of renally dose-adjusted therapy for this FUO.   Drug fever is a febrile response that coincides temporally with the administration of a drug. It abates rapidly after discontinuation of the drug. Typically, the onset of fever occurs within 7 to 10 days of the first dose. It is widely considered to be a type-III hypersensitivity reaction mediated by antibody-antigen complexes. Re-challenge of the offending agent is not recommended.   There is a paucity of data regarding cross-reactivity of cephalosporins and penicillins in causing drug fever. A review of literature available is inconclusive about whether there is a true cross-reactivity between cephalosporins and penicillins. It is questionable if there is extrapolability between concept of penicillin hypersensitivity and cephalosporin hypersensitivity (due to the chemical similarities between these two molecules). As cephalosporins progress in generation, they become increasingly less likely to have cross-reactivity with penicillins. Thus, first generation cephalosporins are more likely to be associated with cross-reactions.   Trinna Balloon. Addasyn Mcbreen, PharmD Candidate and Aura Fey. March Rummage, PharmD Candidate  References:  1. Erenest Blank, Roe Rutherford. Drug Fever. Pharmacotherapy. 2010 Jan;30(1):57-69. doi: 10.1592/phco.[email or web address].   2. Valetta Close, Siviu-Dan, F. Drug Allergy. Allergy Asthma Clin  Immunol. 2011; 7(Suppl 1): S10.

## 2015-01-14 NOTE — Progress Notes (Signed)
Physical Therapy Weekly Progress Note  Patient Details  Name: [name] MRN: [id number] Date of Birth: 07/30/1975  Beginning of progress report period: January 06, 2015 End of progress report period: January 14, 2015  Today's Date: 01/14/2015 PT Individual Time: 0800-0900 and 1300-1335 PT Individual Time Calculation (min): 60 min and 35 min (total 95 min)   Patient has met 5 of 5 short term goals.  Pt is supervision overall for all mobility. Patient is limited primarily by decreased aerobic endurance as well as LLE pain from catheter placement, and requires several rest breaks throughout sessions and is limited in walking distance due to fatigue.   Patient continues to demonstrate the following deficits: strength and balance impairments, decreased activity tolerance and therefore will continue to benefit from skilled PT intervention to enhance overall performance with activity tolerance, balance, postural control, ability to compensate for deficits and functional use of  left upper extremity and left lower extremity.  Patient progressing toward long term goals..  Continue plan of care.  PT Short Term Goals Week 1:  PT Short Term Goal 1 (Week 1): Pt will be Min guard for transfers PT Short Term Goal 1 - Progress (Week 1): Met PT Short Term Goal 2 (Week 1): Pt will be SBA for all bed mobility PT Short Term Goal 2 - Progress (Week 1): Met PT Short Term Goal 3 (Week 1): Pt will be Min Guard for gait 150' PT Short Term Goal 3 - Progress (Week 1): Met PT Short Term Goal 4 (Week 1): Pt will perform stairs 12 with Min A PT Short Term Goal 4 - Progress (Week 1): Met Week 2:  PT Short Term Goal 1 (Week 2): =LTG goals due to estimated length of stay  Skilled Therapeutic Interventions/Progress Updates:    0800-0900: Pt received asleep in bed, easily aroused. No c/o pain and states that she was sick all weekend however is feeling better and agreeable to treatment "as long as we take it slow".  Supine>sit with HOB elevated and bedrails with supervision. Pt seated on EOB while eating breakfast to improve sitting activity tolerance; performed with supervision. Gt x 100' with rollator and supervision; pt reports LLE feels "heavier" than normal, "like dead weight" and therapist noted pt favoring LLE with decrease stance time and poor weight shifting to L. Upon reaching room for seated rest break, assessed LLE strength; knee extension and dorsiflexion 4+/5 and WNL compared to R side. Do note increase in edema L compared to R. Gait with rollator to therapy gym x100' with supervision. Dynamic gait balance activities performed with pt ambulating without AD to retrieve beanbags from around the room at various heights, including overhead and on the floor. When reaching to pick up first bag from floor pt had major LOB in forward direction, became very fearful stating that she was going to fall. Required maxA to recover balance and come to standing position. Remainder of activity performed with CGA and no additional LOB however pt refuses picking up second bean bag from floor. Gait with rollator x150' with supervision. Returned to room and pt requested to use restroom; gait with rollator into bathroom with supervision. Pt left in bathroom with instruction to use call bell system and wait for assistance before getting up; NA [place] alerted to pt status/location.   1300-1400: Pt received supine asleep in bed, easily aroused. Pt reports she feels "awful", does not think she can do therapy at this time. With encouragement pt agreeable to sitting on EOB; supine>sit  with supervision and bedrails. Pt reports feeling dizzy after coming to seated position however resolves in <1 min. After several minutes pt agreeable to attempt standing. Standing with supervision x1 min when pt reports feeling very dizzy; returned to seated position. Seated vitals assessed BP 138/80 HR 114; upon standing BP 126/76 HR 126 and pt again  reporting light headedness, nausea and requests to return to seated position. Pt returned to seated position and rested seated EOB for several minutes. Encouraged pt to attempt standing again, however pt states she feels like she is going to vomit and would like to lay back down. RN alerted to pt status, and discussed pt relatively high HR at baseline, and pt has been given medication for nausea. Pt encouraged again to participate however continues to decline. Returned to supine position with supervision and remained in bed at completion of session. Missed 25 min due to pt refusal/nausea/fatigue.   Therapy Documentation Precautions:  Precautions Precautions: Fall Precaution Comments: Pt's femoral cath is tunneled therefore she is able to get OOB. Restrictions Weight Bearing Restrictions: No General: PT Amount of Missed Time (min): 25 Minutes PT Missed Treatment Reason: Patient unwilling to participate;Patient fatigue;Other (Comment) (nausea) Pain: Pain Assessment Pain Assessment: No/denies pain Pain Score: 0-No pain   See Function Navigator for Current Functional Status.  Therapy/Group: Individual Therapy  Luberta Mutter 01/14/2015, 8:52 AM

## 2015-01-14 NOTE — Progress Notes (Signed)
Subjective/Complaints: Doesn't like HD diet, changed per Nephro on 10/16 Spiked to 102.6 on 10/16 BC ordered per nephro    ROS- neg SOB, Constipation, pain, no sweats or chills, no pain at line sites  Objective: Vital Signs: Blood pressure 136/75, pulse 97, temperature 98.7 F (37.1 C), temperature source Oral, resp. rate 19, height _0  (1.6 m), weight 112 kg (246 lb 14.6 oz), last menstrual period 11/27/2014, SpO2 99 %. No results found. Results for orders placed or performed during the hospital encounter of 01/05/15 (from the past 72 hour(s))  Urinalysis, Routine w reflex microscopic (not at [facility])     Status: Abnormal   Collection Time: 01/11/15  4:35 PM  Result Value Ref Range   Color, Urine YELLOW YELLOW   APPearance CLOUDY (A) CLEAR   Specific Gravity, Urine 1.022 1.005 - 1.030   pH 6.5 5.0 - 8.0   Glucose, UA NEGATIVE NEGATIVE mg/dL   Hgb urine dipstick NEGATIVE NEGATIVE   Bilirubin Urine SMALL (A) NEGATIVE   Ketones, ur NEGATIVE NEGATIVE mg/dL   Protein, ur >300 (A) NEGATIVE mg/dL   Urobilinogen, UA 0.2 0.0 - 1.0 mg/dL   Nitrite NEGATIVE NEGATIVE   Leukocytes, UA TRACE (A) NEGATIVE  Urine culture     Status: None   Collection Time: 01/11/15  4:35 PM  Result Value Ref Range   Specimen Description URINE, CATHETERIZED    Special Requests NONE    Culture NO GROWTH 2 DAYS    Report Status 01/13/2015 FINAL   Urine microscopic-add on     Status: Abnormal   Collection Time: 01/11/15  4:35 PM  Result Value Ref Range   Squamous Epithelial / LPF RARE RARE   WBC, UA 3-6 <3 WBC/hpf   Bacteria, UA FEW (A) RARE  Culture, blood (routine x 2)     Status: None (Preliminary result)   Collection Time: 01/11/15  4:55 PM  Result Value Ref Range   Specimen Description BLOOD BLOOD LEFT HAND    Special Requests BOTTLES DRAWN AEROBIC ONLY 5CC    Culture NO GROWTH 2 DAYS    Report Status PENDING   Culture, blood (routine x 2)     Status: None (Preliminary result)   Collection  Time: 01/11/15  5:06 PM  Result Value Ref Range   Specimen Description BLOOD BLOOD RIGHT HAND    Special Requests BOTTLES DRAWN AEROBIC ONLY 5CC    Culture NO GROWTH 2 DAYS    Report Status PENDING   Protime-INR     Status: Abnormal   Collection Time: 01/12/15  4:46 AM  Result Value Ref Range   Prothrombin Time 36.6 (H) 11.6 - 15.2 seconds   INR 3.80 (H) 0.00 - 1.49  Comprehensive metabolic panel     Status: Abnormal   Collection Time: 01/12/15 10:10 AM  Result Value Ref Range   Sodium 134 (L) 135 - 145 mmol/L   Potassium 4.1 3.5 - 5.1 mmol/L   Chloride 96 (L) 101 - 111 mmol/L   CO2 29 22 - 32 mmol/L   Glucose, Bld 91 65 - 99 mg/dL   BUN 25 (H) 6 - 20 mg/dL   Creatinine, Ser 6.53 (H) 0.44 - 1.00 mg/dL    Comment: DELTA CHECK NOTED   Calcium 8.2 (L) 8.9 - 10.3 mg/dL   Total Protein 4.9 (L) 6.5 - 8.1 g/dL   Albumin 1.9 (L) 3.5 - 5.0 g/dL   AST 41 15 - 41 U/L   ALT 12 (L) 14 - 54 U/L  Alkaline Phosphatase 52 38 - 126 U/L   Total Bilirubin 0.8 0.3 - 1.2 mg/dL   GFR calc non Af Amer 7 (L) >60 mL/min   GFR calc Af Amer 8 (L) >60 mL/min    Comment: (NOTE) The eGFR has been calculated using the CKD EPI equation. This calculation has not been validated in all clinical situations. eGFR's persistently <60 mL/min signify possible Chronic Kidney Disease.    Anion gap 9 5 - 15  CBC     Status: Abnormal   Collection Time: 01/12/15  3:59 PM  Result Value Ref Range   WBC 8.8 4.0 - 10.5 K/uL   RBC 3.27 (L) 3.87 - 5.11 MIL/uL   Hemoglobin 9.6 (L) 12.0 - 15.0 g/dL   HCT 30.7 (L) 36.0 - 46.0 %   MCV 93.9 78.0 - 100.0 fL   MCH 29.4 26.0 - 34.0 pg   MCHC 31.3 30.0 - 36.0 g/dL   RDW 17.2 (H) 11.5 - 15.5 %   Platelets 339 150 - 400 K/uL  Protime-INR     Status: Abnormal   Collection Time: 01/13/15  4:15 AM  Result Value Ref Range   Prothrombin Time 37.5 (H) 11.6 - 15.2 seconds   INR 3.93 (H) 0.00 - 1.49  Protime-INR     Status: Abnormal   Collection Time: 01/14/15  4:36 AM  Result  Value Ref Range   Prothrombin Time 42.5 (H) 11.6 - 15.2 seconds   INR 4.64 (H) 0.00 - 1.49  CBC     Status: Abnormal   Collection Time: 01/14/15  4:36 AM  Result Value Ref Range   WBC 5.7 4.0 - 10.5 K/uL   RBC 3.02 (L) 3.87 - 5.11 MIL/uL   Hemoglobin 8.9 (L) 12.0 - 15.0 g/dL   HCT 28.0 (L) 36.0 - 46.0 %   MCV 92.7 78.0 - 100.0 fL   MCH 29.5 26.0 - 34.0 pg   MCHC 31.8 30.0 - 36.0 g/dL   RDW 17.1 (H) 11.5 - 15.5 %   Platelets 356 150 - 400 K/uL     HEENT: normal Cardio: RRR and no murmurs Resp: CTA B/L and unlabored GI: BS positive and non tender Extremity:  Pulses positive and No Edema, bruising Left thigh around surgical site, tenderness lateral aspect of graft, good bruit Skin:   Intact Neuro: Alert/Oriented, Flat and Abnormal Motor 4-/5 LUE and LLE, 5- in RUE and RLE Musc/Skel:  Other no pain with AROM in UE or LEs Gen NAD   Assessment/Plan: 1. Functional deficits secondary to Right MCA which require 3+ hours per day of interdisciplinary therapy in a comprehensive inpatient rehab setting. Physiatrist is providing close team supervision and 24 hour management of active medical problems listed below. Physiatrist and rehab team continue to assess barriers to discharge/monitor patient progress toward functional and medical goals.  IM on consult for medical issues FIM: Function - Bathing Position: Wheelchair/chair at sink Body parts bathed by patient: Right arm, Left arm, Chest, Abdomen, Front perineal area, Buttocks, Right upper leg, Left upper leg Body parts bathed by helper: Back Bathing not applicable: Left lower leg, Right lower leg Assist Level: Supervision or verbal cues  Function- Upper Body Dressing/Undressing What is the patient wearing?: Bra, Pull over shirt/dress Bra - Perfomed by patient: Thread/unthread right bra strap, Thread/unthread left bra strap Bra - Perfomed by helper: Hook/unhook bra (pull down sports bra) Pull over shirt/dress - Perfomed by patient:  Thread/unthread right sleeve, Thread/unthread left sleeve, Put head through opening, Pull shirt  over trunk Assist Level: Touching or steadying assistance(Pt > 75%) Set up : To obtain clothing/put away Function - Lower Body Dressing/Undressing What is the patient wearing?: Underwear, Non-skid slipper socks (skirt) Position: Wheelchair/chair at sink Underwear - Performed by patient: Thread/unthread right underwear leg, Thread/unthread left underwear leg, Pull underwear up/down (with use of reacher) Underwear - Performed by helper: Thread/unthread right underwear leg, Thread/unthread left underwear leg Non-skid slipper socks- Performed by helper: Don/doff right sock, Don/doff left sock Socks - Performed by helper: Don/doff right sock, Don/doff left sock Shoes - Performed by helper: Don/doff right shoe, Don/doff left shoe, Fasten right, Fasten left Assist for footwear: Dependant Assist for lower body dressing: Touching or steadying assistance (Pt > 75%)  Function - Toileting Toileting activity did not occur: Safety/medical concerns Toileting steps completed by patient: Adjust clothing prior to toileting, Performs perineal hygiene, Adjust clothing after toileting Toileting steps completed by helper: Performs perineal hygiene Assist level: Supervision or verbal cues  Function - Air cabin crew transfer activity did not occur: Safety/medical concerns Toilet transfer assistive device: Elevated toilet seat/BSC over toilet, Grab bar Assist level to toilet: Supervision or verbal cues Assist level from toilet: Supervision or verbal cues Assist level to bedside commode (at bedside): Moderate assist (Pt 50 - 74%/lift or lower) Assist level from bedside commode (at bedside): Moderate assist (Pt 50 - 74%/lift or lower)  Function - Chair/bed transfer Chair/bed transfer activity did not occur: Safety/medical concerns Chair/bed transfer method: Stand pivot Chair/bed transfer assist level:  Supervision or verbal cues Chair/bed transfer assistive device:  (rollator) Chair/bed transfer details: Verbal cues for technique  Function - Locomotion: Wheelchair Will patient use wheelchair at discharge?: No Type: Manual Max wheelchair distance: 200 Assist Level: Supervision or verbal cues Assist Level: Supervision or verbal cues Wheel 150 feet activity did not occur: Safety/medical concerns Assist Level: Touching or steadying assistance (Pt > 75%) Turns around,maneuvers to table,bed, and toilet,negotiates 3% grade,maneuvers on rugs and over doorsills: No Function - Locomotion: Ambulation Assistive device: Other (comment) (rollator) Max distance: 100 Assist level: Supervision or verbal cues Assist level: Supervision or verbal cues Assist level: Supervision or verbal cues Walk 150 feet activity did not occur: Safety/medical concerns Assist level: Supervision or verbal cues Walk 10 feet on uneven surfaces activity did not occur: Safety/medical concerns Assist level: Touching or steadying assistance (Pt > 75%) (ramp)  Function - Comprehension Comprehension: Auditory Comprehension assist level: Follows complex conversation/direction with extra time/assistive device  Function - Expression Expression: Verbal Expression assist level: Expresses complex ideas: With extra time/assistive device  Function - Social Interaction Social Interaction assist level: Interacts appropriately with others with medication or extra time (anti-anxiety, antidepressant).  Function - Problem Solving Problem solving assist level: Solves complex problems: With extra time  Function - Memory Memory assist level: Complete Independence: No helper Patient normally able to recall (first 3 days only): Location of own room, Staff names and faces, Current season, That he or she is in a hospital   Medical Problem List and Plan: 1. 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 DVT Prophylaxis/Anticoagulation: Coumadin per pharmacy protocol and no bridging required. Monitor for any bleeding episodes, INR therapeutic as of 10/11 3. Pain Management: Oxycodone and flexeril as needed. Monitor with increased mobility 4. Lupus nephritis with end-stage renal disease. Continue hemodialysis as advised. Permanent access graft placement 01/04/2015.Nephro following, no further pain around graft, WBC normal  10/13 5. Neuropsych: This patient is capable of making decisions on her own behalf. 6. Skin/Wound Care: Routine skin care 7. Fluids/Electrolytes/Nutrition: Strict I&O with follow-up chemistries, variable but reduced intake 0-50%, doesn't like renal diet- pt will discuss with nephro 8. Endocarditis of mitral valve. Unable to rule out infectious endocarditis as patient is immunosuppressed and leukopenic. Blood cultures negative. Plan vancomycin until 01/22/2015. 9. Hypertension. Norvasc 5 mg daily, Imdur or 30 mg daily, Toprol-XL 25 mg daily at bedtime. Monitor with increased mobility, 136/75 10. Pseudomonas urinary tract infection. Ceftazadime completed 01/03/2015, repeat UA  Neg, Cx neg 11. Morbid obesity. Dietary follow-up 12. Sacral decubitus. Foam dressing change every 3 days as needed pressure relief, bed positioning, turn q2h, wound healing will be delayed with poor intake 13. Hyperlipidemia. Lipitor 14.  Nausea and epigastric tenderness, no hx of ulcer pt pt, ? Could be related to zosyn or diflucan as new med, on PPI  increase to BID  15 FUO- per VVS, not graft related, IM consulted, no obvious cause spiking through IV Zosyn in addition to vanc WBC reviewed and normal, ?drug fever, await BC, ID consult may be helpful in this regard 16.  Depression mood- appreciate neuropsych eval, pt declines medical treatment at this time  LOS (Days) 9 A FACE TO FACE EVALUATION WAS  PERFORMED  Simonne Boulos E 01/14/2015, 7:23 AM

## 2015-01-14 NOTE — Progress Notes (Signed)
Nutrition Follow-up  DOCUMENTATION CODES:   Morbid obesity  INTERVENTION:   Provide Boost Breeze po TID, each supplement provides 250 kcal and 9 grams of protein. (Mix with diet soda to enhance flavor).  Continue 30 ml Prostat po once daily, each supplement provides 100 kcal and 15 grams of protein.   Encourage adequate PO intake.   Renal diet education given.   NUTRITION DIAGNOSIS:   Increased nutrient needs related to chronic illness as evidenced by estimated needs; ongoing  GOAL:   Patient will meet greater than or equal to 90% of their needs; not met  MONITOR:   PO intake, Supplement acceptance, Weight trends, Labs, I & O's  REASON FOR ASSESSMENT:   Consult Assessment of nutrition requirement/status  ASSESSMENT:   39 y.o. right hand female with history of fibromyalgia, SVT status post ablation, chronic anemia, hypertension, SLE with recently diagnosed lupus nephritis. Underwent HD catheter placement per radiology services 12/06/2014 and dialysis initiated with permanent dialysis catheter 12/11/2014 per Dr. Trula Slade that required ligation of AV graft due to ischemic steal of left arm 12/19/2014 and later with left thigh graft placement for HD 01/04/2015 On 12/21/2014 patient became verbally less responsive left facial droop right eye deviation. MRI of the brain showed right MCA infarct.  Diet has been liberalized to a regular diet. Pt reports having a decreased appetite due to feeling ill. Meal completion has been 20-50%. Pt has been consuming her Prostat. RD to continue with orders. Pt additionally is agreeable to Boost Breeze as po intake has been poor. RD to order. Pt was encouraged to mix with a diet soda (diet sprite/diet gingerale) to enhance flavor. During time of visit, pt did not eat her lunch as she did not like it. She reports rather having pizza. RD made new lunch order. Pt was additionally educated on a renal diet. Pt expressed understanding.   Pt with no  observed significant fat or muscle mass loss.   Diet Order:  Diet regular Room service appropriate?: Yes; Fluid consistency:: Thin  Skin:  Wound (see comment) (Stage II pressure ulcer on buttocks)  Last BM:  10/12  Height:   Ht Readings from Last 1 Encounters:  01/05/15 5' 3"  (1.6 m)    Weight:   Wt Readings from Last 1 Encounters:  01/14/15 246 lb 14.6 oz (112 kg)    Ideal Body Weight:  52.27 kg  BMI:  Body mass index is 43.75 kg/(m^2).  Estimated Nutritional Needs:   Kcal:  6701-4103  Protein:  110-125 grams  Fluid:  1.2 L/day  EDUCATION NEEDS:   Education needs addressed  Corrin Parker, MS, RD, LDN Pager # [phone] After hours/ weekend pager # [phone]

## 2015-01-14 NOTE — Progress Notes (Signed)
1. CVA- doing well in rehab 2. ESRD- continue with HD qTTS (first HD session 12/07/14); next tx on Tuesday. 3. Anemia: s/p PRBCs 4. CKD-MBD: discontinue binders due to hypophosphatemia and nausea with phoslo 5. Nausea- 6. Vascular access- left femoral AVG placed 01/04/15 by Dr. Donnetta Hutching as well as right femoral tunneled HD cath 7. Hypertension:stable off amlodipine 8. Endocarditis- presumably Liebman-Sachs 9. SLE- biopsy on 8/25 & biopsy was complicated by a hematoma and it revealed again diffuse proliferative changes with crescents indicating that it could respond to immune suppressing therapy. She under went IV solumedrol, then high dose prednisone and was also on max dose cellcept. These agents have been weaned off as she has progressed to [place]. 10. SVT- on beta blocker 11. Fever of uncertain cause ?SLE since off potent immunosuppressants  Subjective: Interval History: Thinks she is getting stronger; LLE swollen  Objective: Vital signs in last 24 hours: Temp:  [98.6 F (37 C)-101.4 F (38.6 C)] 99 F (37.2 C) (10/17 1500) Pulse Rate:  [97-109] 109 (10/17 1500) Resp:  [18-19] 18 (10/17 1500) BP: (123-136)/(62-75) 123/62 mmHg (10/17 1500) SpO2:  [99 %] 99 % (10/17 1500) Weight:  [112 kg (246 lb 14.6 oz)] 112 kg (246 lb 14.6 oz) (10/17 0522) Weight change: 4 kg (8 lb 13.1 oz)  Intake/Output from previous day: 10/16 0701 - 10/17 0700 In: 480 [P.O.:480] Out: -  Intake/Output this shift: Total I/O In: 20 [I.V.:20] Out: -   General appearance: alert and cooperative GI: soft without masses Extremities: LLE edematous  Lab Results:  Recent Labs  01/12/15 1559 01/14/15 0436  WBC 8.8 5.7  HGB 9.6* 8.9*  HCT 30.7* 28.0*  PLT 339 356   BMET:  Recent Labs  01/12/15 1010  NA 134*  K 4.1  CL 96*  CO2 29  GLUCOSE 91  BUN 25*  CREATININE 6.53*  CALCIUM 8.2*   No results for input(s): PTH in the last 72 hours. Iron Studies: No results for input(s): IRON, TIBC,  TRANSFERRIN, FERRITIN in the last 72 hours. Studies/Results: No results found.  Scheduled: . sodium chloride   Intravenous Once  . atorvastatin  20 mg Oral q1800  . bisacodyl  5 mg Oral BID  . cyclobenzaprine  10 mg Oral QHS  . darbepoetin (ARANESP) injection - DIALYSIS  200 mcg Intravenous Q Thu-HD  . feeding supplement (PRO-STAT SUGAR FREE 64)  30 mL Oral Daily  . hydroxychloroquine  400 mg Oral Daily  . isosorbide mononitrate  30 mg Oral Daily  . metoprolol succinate  25 mg Oral QHS  . multivitamin  1 tablet Oral QHS  . pantoprazole  20 mg Oral Daily  . piperacillin-tazobactam (ZOSYN)  IV  2.25 g Intravenous 3 times per day  . vancomycin  1,250 mg Intravenous Q T,Th,Sa-HD  . Warfarin - Pharmacist Dosing Inpatient   Does not apply q1800     LOS: 9 days   Bevin Mayall C 01/14/2015,3:26 PM

## 2015-01-14 NOTE — Progress Notes (Signed)
Subjective: [name] is a 39yo F with PMH SLE, HTN, SVT who we were consulted for to evaluation of fever of unknown origin. She did have one episode of fevers yesterday afternoon to 102.6, with tachycardia to 113 but defervesced shortly thereafter and was asymptomatic throughout the episode. She continues to have mild nausea and abdominal discomfort that is intermittent and occurred after her PT session this morning. She says that her L leg graft area gets painful after her PT session, but otherwise feels fine and has no other issues.  Objective: Vital signs in last 24 hours: Filed Vitals:   01/13/15 1951 01/13/15 2054 01/14/15 0522 01/14/15 1100  BP:  129/68 136/75   Pulse:  103 97   Temp: 98.9 F (37.2 C)  98.7 F (37.1 C) 98.6 F (37 C)  TempSrc: Oral  Oral Oral  Resp:   19   Height:      Weight:   246 lb 14.6 oz (112 kg)   SpO2:   99%    Weight change: 8 lb 13.1 oz (4 kg)  Intake/Output Summary (Last 24 hours) at 01/14/15 1115 Last data filed at 01/13/15 1800  Gross per 24 hour  Intake    480 ml  Output      0 ml  Net    480 ml   BP 136/75 mmHg  Pulse 97  Temp(Src) 98.6 F (37 C) (Oral)  Resp 19  Ht 5' 3"  (1.6 m)  Wt 246 lb 14.6 oz (112 kg)  BMI 43.75 kg/m2  SpO2 99%  LMP 11/27/2014  General Appearance:    Alert, cooperative, no distress, appears stated age   Heart:    Regular rate and rhythm, S1 and S2 normal, S4 heard best at left sternal border. No S3 heard. No murmurs or rubs heard.  Abdomen:     Soft, non-tender, bowel sounds active all four quadrants,    no masses, no organomegaly  Extremities:   Left leg and foot are apparently edematous and significantly larger in diameter than the right leg. Calf nontender to ankle dorsiflexion bilaterally. Intact left thigh permanent graph with good thrill and some bruising in the surrounding region. There is no bleeding, increased warmth, only mild tenderness, without any obvious signs of infection. Right thigh  temporary cath c/d/i.   Pulses:   2+ and symmetric all extremities  Skin:   Skin color, texture, turgor normal, no rashes or lesions  Neurologic:   CNII-XII intact, normal strength, sensation and reflexes    throughout   Lab Results: Basic Metabolic Panel:  Recent Labs Lab 01/10/15 0455 01/11/15 0515 01/12/15 1010  NA 135 136 134*  K 3.9 4.3 4.1  CL 97* 98* 96*  CO2 29 28 29   GLUCOSE 78 76 91  BUN 20 13 25*  CREATININE 5.56* 4.26* 6.53*  CALCIUM 8.3* 7.8* 8.2*  PHOS 2.2* 2.3*  --    CBC:  Recent Labs Lab 01/12/15 1559 01/14/15 0436  WBC 8.8 5.7  HGB 9.6* 8.9*  HCT 30.7* 28.0*  MCV 93.9 92.7  PLT 339 356   Coagulation:  Recent Labs Lab 01/11/15 0515 01/12/15 0446 01/13/15 0415 01/14/15 0436  LABPROT 30.8* 36.6* 37.5* 42.5*  INR 3.03* 3.80* 3.93* 4.64*   Assessment/Plan: Active Problems:   Right middle cerebral artery stroke (HCC)   Left hemiparesis (HCC)   Post-operative pain   Adjustment disorder with depressed mood  Fever of unknown origin: Patient had oral temperatures of 101.9 on 10/13, 102.9 and 103.2  on 10/14, spiked again to 101.2 on 10/16. CXR on 10/14 showed mild left basilar subsegmental atelectasis, no pleural effusion or consolidation noted. UA negative for UTI and no urinary symptoms. On 10/14, was found to have candidal vulvovaginitis, with one dose of fluconazole given. Patient is on empiric zosyn as well as continuing vancomycin for endocarditis coverage. Her continued spiking fevers are possibly of infectious cause without obvious source, however lupus flares and drug-induced fever are possible considerations. She says her flare ups prior to hospitalization involved swelling in her hands and feet, but she does not recall any associated fevers when asked. She was previously pancytopenic with WBCs b/w 1-2 at the beginning of this month. WBC normalized now at 5.7. She does have increased leg swelling today on her left side, without any other  symptoms, which may just be venous occlusion from her graft but may also be from thrombosis, which may also explain her spiking fevers. Another consideration in her is HD line infection. -Contacted HD to obtain HD line cultures x 2 when she goes for HD later today -Obtain LE DVT dopplers bilaterally -Consider ID consult today for any further recs on evaluation for FUA -Consider rheumatology consult as lupus flare is possible explanation for continued fevers -ESR/CRP - if normal, may help differentiate between lupus and other processes -Procalcitonin -Continue to monitor for fever, acetaminophen PRN -Continue vancomycin (last dose anticipated 10/25) and zosyn -F/u BCx and UCx -Consider ID consult if patient continues to spike overnight.  Dispo: Disposition is deferred at this time, awaiting improvement of current medical problems.     LOS: 9 days   Norval Gable, MD 01/14/2015, 11:15 AM

## 2015-01-14 NOTE — Plan of Care (Signed)
Problem: RH BOWEL ELIMINATION Goal: RH STG MANAGE BOWEL W/MEDICATION W/ASSISTANCE STG Manage Bowel with Medication with Mod I Assistance.  Outcome: Progressing No incontinent episode reported

## 2015-01-14 NOTE — Plan of Care (Signed)
Problem: Food- and Nutrition-Related Knowledge Deficit (NB-1.1) Goal: Nutrition education Formal process to instruct or train a patient/client in a skill or to impart knowledge to help patients/clients voluntarily manage or modify food choices and eating behavior to maintain or improve health. Outcome: Completed/Met Date Met:  01/14/15 Nutrition Education Note  Provided "Healthy Eating with Kidney Disease" handout to patient. Reviewed food groups and provided written recommended serving sizes specifically determined for patient's current nutritional status.   Explained why diet restrictions are needed and provided lists of foods to limit/avoid that are high potassium, sodium, and phosphorus. Provided specific recommendations on safer alternatives of these foods. Strongly encouraged compliance of this diet.   Discussed importance of protein intake at each meal and snack. Provided examples of how to maximize protein intake throughout the day. Discussed need for fluid restriction with dialysis and renal-friendly beverage options. Teach back method used.  Expect good compliance.  Eileen Parker, MS, RD, LDN Pager # [phone] After hours/ weekend pager # [phone]

## 2015-01-15 ENCOUNTER — Inpatient Hospital Stay (HOSPITAL_COMMUNITY): Payer: Medicaid Other | Admitting: Occupational Therapy

## 2015-01-15 ENCOUNTER — Inpatient Hospital Stay (HOSPITAL_COMMUNITY): Payer: Medicaid Other | Admitting: Physical Therapy

## 2015-01-15 ENCOUNTER — Inpatient Hospital Stay (HOSPITAL_COMMUNITY): Payer: Medicaid Other

## 2015-01-15 ENCOUNTER — Other Ambulatory Visit (HOSPITAL_COMMUNITY): Payer: Medicaid Other

## 2015-01-15 DIAGNOSIS — Z992 Dependence on renal dialysis: Secondary | ICD-10-CM

## 2015-01-15 DIAGNOSIS — N186 End stage renal disease: Secondary | ICD-10-CM

## 2015-01-15 DIAGNOSIS — R509 Fever, unspecified: Secondary | ICD-10-CM

## 2015-01-15 LAB — C DIFFICILE QUICK SCREEN W PCR REFLEX
C DIFFICILE (CDIFF) TOXIN: NEGATIVE
C Diff antigen: NEGATIVE
C Diff interpretation: NEGATIVE

## 2015-01-15 LAB — CBC
HEMATOCRIT: 33.4 % — AB (ref 36.0–46.0)
HEMOGLOBIN: 10.8 g/dL — AB (ref 12.0–15.0)
MCH: 30.3 pg (ref 26.0–34.0)
MCHC: 32.3 g/dL (ref 30.0–36.0)
MCV: 93.8 fL (ref 78.0–100.0)
Platelets: 411 10*3/uL — ABNORMAL HIGH (ref 150–400)
RBC: 3.56 MIL/uL — ABNORMAL LOW (ref 3.87–5.11)
RDW: 17.7 % — AB (ref 11.5–15.5)
WBC: 7.9 10*3/uL (ref 4.0–10.5)

## 2015-01-15 LAB — RENAL FUNCTION PANEL
ALBUMIN: 1.9 g/dL — AB (ref 3.5–5.0)
ANION GAP: 12 (ref 5–15)
BUN: 22 mg/dL — AB (ref 6–20)
CHLORIDE: 97 mmol/L — AB (ref 101–111)
CO2: 24 mmol/L (ref 22–32)
Calcium: 8.1 mg/dL — ABNORMAL LOW (ref 8.9–10.3)
Creatinine, Ser: 7.96 mg/dL — ABNORMAL HIGH (ref 0.44–1.00)
GFR calc Af Amer: 7 mL/min — ABNORMAL LOW (ref >60)
GFR calc non Af Amer: 6 mL/min — ABNORMAL LOW (ref >60)
GLUCOSE: 79 mg/dL (ref 65–99)
PHOSPHORUS: 3.4 mg/dL (ref 2.5–4.6)
Potassium: 4.1 mmol/L (ref 3.5–5.1)
Sodium: 133 mmol/L — ABNORMAL LOW (ref 135–145)

## 2015-01-15 LAB — PROTIME-INR
INR: 3.91 — AB (ref 0.00–1.49)
PROTHROMBIN TIME: 37.4 s — AB (ref 11.6–15.2)

## 2015-01-15 LAB — VANCOMYCIN, RANDOM: Vancomycin Rm: 18 ug/mL

## 2015-01-15 MED ORDER — SODIUM CHLORIDE 0.9 % IV SOLN: 100.0000 mL | INTRAVENOUS | Status: DC | PRN

## 2015-01-15 MED ORDER — LIDOCAINE HCL (PF) 1 % IJ SOLN: 5.0000 mL | INTRAMUSCULAR | Status: DC | PRN

## 2015-01-15 MED ORDER — HEPARIN SODIUM (PORCINE) 1000 UNIT/ML DIALYSIS: 1000.0000 [IU] | INTRAMUSCULAR | Status: DC | PRN

## 2015-01-15 MED ORDER — LIDOCAINE-PRILOCAINE 2.5-2.5 % EX CREA
1.0000 "application " | TOPICAL_CREAM | CUTANEOUS | Status: DC | PRN
  Filled 2015-01-15: qty 5

## 2015-01-15 MED ORDER — ALTEPLASE 2 MG IJ SOLR
2.0000 mg | Freq: Once | INTRAMUSCULAR | Status: DC | PRN
  Filled 2015-01-15: qty 2

## 2015-01-15 MED ORDER — PENTAFLUOROPROP-TETRAFLUOROETH EX AERO: 1.0000 "application " | INHALATION_SPRAY | CUTANEOUS | Status: DC | PRN

## 2015-01-15 NOTE — Progress Notes (Signed)
Subjective/Complaints: Appreciate Renal and IM note Afebrile x >24H    ROS- neg SOB, Constipation, pain, no sweats or chills, no pain at line sites  Objective: Vital Signs: Blood pressure 120/70, pulse 101, temperature 98.6 F (37 C), temperature source Oral, resp. rate 18, height _0  (1.6 m), weight 88 kg (194 lb 0.1 oz), last menstrual period 11/27/2014, SpO2 100 %. Ct Abdomen Pelvis Wo Contrast  01/15/2015  CLINICAL DATA:  Acute onset of fever, nausea and abdominal discomfort. Initial encounter. EXAM: CT ABDOMEN AND PELVIS WITHOUT CONTRAST TECHNIQUE: Multidetector CT imaging of the abdomen and pelvis was performed following the standard protocol without IV contrast. COMPARISON:  CT of the abdomen and pelvis from 12/03/2014 FINDINGS: A small left pleural effusion is noted, with left basilar airspace opacity likely reflecting atelectasis. The liver and spleen are unremarkable in appearance. The gallbladder is within normal limits. The pancreas and adrenal glands are unremarkable. Nonspecific perinephric stranding is noted bilaterally, with trace associated fluid tracking along Gerota's fascia. No renal or ureteral stones are identified. No free fluid is identified. The small bowel is unremarkable in appearance. The stomach is within normal limits. No acute vascular abnormalities are seen. The patient is status post appendectomy. The colon is unremarkable in appearance. The bladder is decompressed and not well assessed. The uterus is unremarkable in appearance. The ovaries are grossly symmetric. No suspicious adnexal masses are seen. No inguinal lymphadenopathy is seen. Soft tissue edema is noted at both flanks, and diffuse soft tissue inflammation is seen tracking along the proximal left thigh. Postoperative change is noted about the left inguinal region, with underlying vascular calcification. A right femoral line is noted ending at the infrarenal IVC. No acute osseous abnormalities are  identified. IMPRESSION: 1. No acute abnormality seen to explain the patient's symptoms. 2. Soft tissue edema at both flanks. Diffuse soft inflammation tracks along the proximal left thigh. Postoperative change at the left inguinal region. Would correlate for any evidence of venous obstruction or cellulitis. 3. Small left pleural effusion, with left basilar airspace opacity likely reflecting atelectasis. 4. Nonspecific bilateral perinephric stranding is relatively stable and likely reflects the patient's baseline. Electronically Signed   By: Garald Balding M.D.   On: 01/15/2015 03:00   Results for orders placed or performed during the hospital encounter of 01/05/15 (from the past 72 hour(s))  Comprehensive metabolic panel     Status: Abnormal   Collection Time: 01/12/15 10:10 AM  Result Value Ref Range   Sodium 134 (L) 135 - 145 mmol/L   Potassium 4.1 3.5 - 5.1 mmol/L   Chloride 96 (L) 101 - 111 mmol/L   CO2 29 22 - 32 mmol/L   Glucose, Bld 91 65 - 99 mg/dL   BUN 25 (H) 6 - 20 mg/dL   Creatinine, Ser 6.53 (H) 0.44 - 1.00 mg/dL    Comment: DELTA CHECK NOTED   Calcium 8.2 (L) 8.9 - 10.3 mg/dL   Total Protein 4.9 (L) 6.5 - 8.1 g/dL   Albumin 1.9 (L) 3.5 - 5.0 g/dL   AST 41 15 - 41 U/L   ALT 12 (L) 14 - 54 U/L   Alkaline Phosphatase 52 38 - 126 U/L   Total Bilirubin 0.8 0.3 - 1.2 mg/dL   GFR calc non Af Amer 7 (L) >60 mL/min   GFR calc Af Amer 8 (L) >60 mL/min    Comment: (NOTE) The eGFR has been calculated using the CKD EPI equation. This calculation has not been validated in all  clinical situations. eGFR's persistently <60 mL/min signify possible Chronic Kidney Disease.    Anion gap 9 5 - 15  CBC     Status: Abnormal   Collection Time: 01/12/15  3:59 PM  Result Value Ref Range   WBC 8.8 4.0 - 10.5 K/uL   RBC 3.27 (L) 3.87 - 5.11 MIL/uL   Hemoglobin 9.6 (L) 12.0 - 15.0 g/dL   HCT 30.7 (L) 36.0 - 46.0 %   MCV 93.9 78.0 - 100.0 fL   MCH 29.4 26.0 - 34.0 pg   MCHC 31.3 30.0 - 36.0  g/dL   RDW 17.2 (H) 11.5 - 15.5 %   Platelets 339 150 - 400 K/uL  Protime-INR     Status: Abnormal   Collection Time: 01/13/15  4:15 AM  Result Value Ref Range   Prothrombin Time 37.5 (H) 11.6 - 15.2 seconds   INR 3.93 (H) 0.00 - 1.49  Culture, blood (routine x 2)     Status: None (Preliminary result)   Collection Time: 01/13/15  3:20 PM  Result Value Ref Range   Specimen Description BLOOD PICC LINE    Special Requests BOTTLES DRAWN AEROBIC AND ANAEROBIC 5CC    Culture NO GROWTH < 24 HOURS    Report Status PENDING   Protime-INR     Status: Abnormal   Collection Time: 01/14/15  4:36 AM  Result Value Ref Range   Prothrombin Time 42.5 (H) 11.6 - 15.2 seconds   INR 4.64 (H) 0.00 - 1.49  CBC     Status: Abnormal   Collection Time: 01/14/15  4:36 AM  Result Value Ref Range   WBC 5.7 4.0 - 10.5 K/uL   RBC 3.02 (L) 3.87 - 5.11 MIL/uL   Hemoglobin 8.9 (L) 12.0 - 15.0 g/dL   HCT 28.0 (L) 36.0 - 46.0 %   MCV 92.7 78.0 - 100.0 fL   MCH 29.5 26.0 - 34.0 pg   MCHC 31.8 30.0 - 36.0 g/dL   RDW 17.1 (H) 11.5 - 15.5 %   Platelets 356 150 - 400 K/uL  Culture, blood (routine x 2)     Status: None (Preliminary result)   Collection Time: 01/14/15 11:34 AM  Result Value Ref Range   Specimen Description BLOOD RIGHT HAND    Special Requests Immunocompromised BAA 5CC    Culture PENDING    Report Status PENDING   Sedimentation rate     Status: Abnormal   Collection Time: 01/14/15 11:50 AM  Result Value Ref Range   Sed Rate 26 (H) 0 - 22 mm/hr  C-reactive protein     Status: Abnormal   Collection Time: 01/14/15 11:50 AM  Result Value Ref Range   CRP 6.1 (H) <1.0 mg/dL  Procalcitonin - Baseline     Status: None   Collection Time: 01/14/15 11:50 AM  Result Value Ref Range   Procalcitonin 3.96 ng/mL    Comment:        Interpretation: PCT > 2 ng/mL: Systemic infection (sepsis) is likely, unless other causes are known. (NOTE)         ICU PCT Algorithm               Non ICU PCT Algorithm     ----------------------------     ------------------------------         PCT < 0.25 ng/mL                 PCT < 0.1 ng/mL     Stopping of antibiotics  Stopping of antibiotics       strongly encouraged.               strongly encouraged.    ----------------------------     ------------------------------       PCT level decrease by               PCT < 0.25 ng/mL       >= 80% from peak PCT       OR PCT 0.25 - 0.5 ng/mL          Stopping of antibiotics                                             encouraged.     Stopping of antibiotics           encouraged.    ----------------------------     ------------------------------       PCT level decrease by              PCT >= 0.25 ng/mL       < 80% from peak PCT        AND PCT >= 0.5 ng/mL            Continuing antibiotics                                               encouraged.       Continuing antibiotics            encouraged.    ----------------------------     ------------------------------     PCT level increase compared          PCT > 0.5 ng/mL         with peak PCT AND          PCT >= 0.5 ng/mL             Escalation of antibiotics                                          strongly encouraged.      Escalation of antibiotics        strongly encouraged.   Culture, blood (routine x 2)     Status: None (Preliminary result)   Collection Time: 01/14/15 11:50 AM  Result Value Ref Range   Specimen Description BLOOD RIGHT FINGER    Special Requests Immunocompromised [id number]    Culture PENDING    Report Status PENDING   Pregnancy, urine     Status: None   Collection Time: 01/14/15  5:28 PM  Result Value Ref Range   Preg Test, Ur NEGATIVE NEGATIVE    Comment:        THE SENSITIVITY OF THIS METHODOLOGY IS >20 mIU/mL.      HEENT: normal Cardio: RRR and no murmurs Resp: CTA B/L and unlabored GI: BS positive and non tender Extremity:  Pulses positive and No Edema, bruising Left thigh around surgical site, tenderness lateral  aspect of graft, good bruit Skin:   Intact Neuro: Alert/Oriented, Flat and Abnormal Motor 4-/5 LUE and LLE, 5- in RUE and RLE Musc/Skel:  Other no pain  with AROM in UE or LEs Gen NAD   Assessment/Plan: 1. Functional deficits secondary to Right MCA which require 3+ hours per day of interdisciplinary therapy in a comprehensive inpatient rehab setting. Physiatrist is providing close team supervision and 24 hour management of active medical problems listed below. Physiatrist and rehab team continue to assess barriers to discharge/monitor patient progress toward functional and medical goals.  IM on consult for medical issues FIM: Function - Bathing Position: Wheelchair/chair at sink Body parts bathed by patient: Right arm, Left arm, Chest, Abdomen, Front perineal area, Buttocks, Right upper leg, Left upper leg Body parts bathed by helper: Back Bathing not applicable: Right lower leg, Left lower leg, Back Assist Level: Supervision or verbal cues  Function- Upper Body Dressing/Undressing What is the patient wearing?: Hospital gown (Pt not feeling well and declined dressing) Bra - Perfomed by patient: Thread/unthread right bra strap, Thread/unthread left bra strap Bra - Perfomed by helper: Hook/unhook bra (pull down sports bra) Pull over shirt/dress - Perfomed by patient: Thread/unthread right sleeve, Thread/unthread left sleeve, Put head through opening, Pull shirt over trunk Assist Level: Set up Set up : To obtain clothing/put away Function - Lower Body Dressing/Undressing What is the patient wearing?: Underwear Position: Wheelchair/chair at sink Underwear - Performed by patient: Thread/unthread right underwear leg, Thread/unthread left underwear leg, Pull underwear up/down (with use of reacher) Underwear - Performed by helper: Thread/unthread right underwear leg, Thread/unthread left underwear leg (patient needed assist to get both legs in skirt) Non-skid slipper socks- Performed by  helper: Don/doff right sock, Don/doff left sock Socks - Performed by helper: Don/doff right sock, Don/doff left sock Shoes - Performed by helper: Don/doff right shoe, Don/doff left shoe, Fasten right, Fasten left Assist for footwear: Dependant Assist for lower body dressing: Supervision or verbal cues  Function - Toileting Toileting activity did not occur: Safety/medical concerns Toileting steps completed by patient: Adjust clothing prior to toileting, Performs perineal hygiene, Adjust clothing after toileting Toileting steps completed by helper: Performs perineal hygiene Assist level: Supervision or verbal cues  Function - Air cabin crew transfer activity did not occur: Safety/medical concerns Toilet transfer assistive device: Elevated toilet seat/BSC over toilet, Grab bar, Walker Assist level to toilet: Supervision or verbal cues Assist level from toilet: Supervision or verbal cues Assist level to bedside commode (at bedside): Moderate assist (Pt 50 - 74%/lift or lower) Assist level from bedside commode (at bedside): Moderate assist (Pt 50 - 74%/lift or lower)  Function - Chair/bed transfer Chair/bed transfer activity did not occur: Safety/medical concerns Chair/bed transfer method: Stand pivot Chair/bed transfer assist level: Supervision or verbal cues Chair/bed transfer assistive device: Walker Agricultural consultant) Chair/bed transfer details: Verbal cues for precautions/safety  Function - Locomotion: Wheelchair Will patient use wheelchair at discharge?: No Type: Manual Max wheelchair distance: 200 Assist Level: Supervision or verbal cues Assist Level: Supervision or verbal cues Wheel 150 feet activity did not occur: Safety/medical concerns Assist Level: Touching or steadying assistance (Pt > 75%) Turns around,maneuvers to table,bed, and toilet,negotiates 3% grade,maneuvers on rugs and over doorsills: No Function - Locomotion: Ambulation Assistive device: Other (comment)  (rollator) Max distance: 100 Assist level: Supervision or verbal cues Assist level: Supervision or verbal cues Assist level: Supervision or verbal cues Walk 150 feet activity did not occur: Safety/medical concerns Assist level: Supervision or verbal cues Walk 10 feet on uneven surfaces activity did not occur: Safety/medical concerns Assist level: Touching or steadying assistance (Pt > 75%) (ramp (not uneven surface))  Function - Comprehension Comprehension: Auditory Comprehension  assist level: Follows complex conversation/direction with extra time/assistive device  Function - Expression Expression: Verbal Expression assist level: Expresses complex 90% of the time/cues < 10% of the time  Function - Social Interaction Social Interaction assist level: Interacts appropriately 90% of the time - Needs monitoring or encouragement for participation or interaction.  Function - Problem Solving Problem solving assist level: Solves basic problems with no assist  Function - Memory Memory assist level: Recognizes or recalls 90% of the time/requires cueing < 10% of the time Patient normally able to recall (first 3 days only): Location of own room, Staff names and faces, Current season, That he or she is in a hospital   Medical Problem List and Plan: 1. 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 DVT Prophylaxis/Anticoagulation: Coumadin per pharmacy protocol and no bridging required. Monitor for any bleeding episodes, INR therapeutic as of 10/11 3. Pain Management: Oxycodone and flexeril as needed. Monitor with increased mobility 4. Lupus nephritis with end-stage renal disease. Continue hemodialysis as advised. Permanent access graft placement 01/04/2015.Nephro following, no further pain around graft, WBC normal 10/13 5. Neuropsych: This patient is capable of making decisions on her own behalf. 6. Skin/Wound Care: Routine  skin care 7. Fluids/Electrolytes/Nutrition: Strict I&O with follow-up chemistries, variable but reduced intake 0-20%, remains poor despite diet change, certainly multi medical issues may influence, CT abd is neg for acute process, depression likely playing a role, trial megace 8. Endocarditis of mitral valve. Unable to rule out infectious endocarditis as patient is immunosuppressed and leukopenic. Blood cultures negative. Plan vancomycin until 01/22/2015. 9. Hypertension. Norvasc 5 mg daily, Imdur or 30 mg daily, Toprol-XL 25 mg daily at bedtime. Monitor with increased mobility, 136/75 10. Pseudomonas urinary tract infection. Ceftazadime completed 01/03/2015, repeat UA  Neg, Cx neg 11. Morbid obesity. Dietary follow-up 12. Sacral decubitus. Foam dressing change every 3 days as needed pressure relief, bed positioning, turn q2h, wound healing will be delayed with poor intake 13. Hyperlipidemia. Lipitor 14.  Nausea improved, pt feels some of this was diet related 15 FUO- per [facility], not graft related, IM consulted, no obvious cause spiking through IV Zosyn in addition to vanc WBC reviewed and normal, ?drug fever, await BC,? endocarditis 16.  Depression mood- appreciate neuropsych eval, pt declines medical treatment at this time  LOS (Days) 10 A FACE TO FACE EVALUATION WAS PERFORMED  KIRSTEINS,ANDREW E 01/15/2015, 6:30 AM

## 2015-01-15 NOTE — Procedures (Signed)
Tolerating hemodialysis.  No hemodynamic issues.  Flat affect. Evella Kasal C

## 2015-01-15 NOTE — Progress Notes (Signed)
ANTIBIOTIC CONSULT NOTE - FOLLOW UP  Pharmacy Consult for Vancomycin and Zosyn Indication: Endocarditis  Allergies  Allergen Reactions  . Food Swelling    Red peppers    Patient Measurements: Height: 5\' 3"  (160 cm) Weight: 198 lb 13.7 oz (90.2 kg) IBW/kg (Calculated) : 52.4  Vital Signs: Temp: 99.1 F (37.3 C) (10/18 1546) Temp Source: Oral (10/18 1546) BP: 143/75 mmHg (10/18 1724) Pulse Rate: 120 (10/18 1724) Intake/Output from previous day: 10/17 0701 - 10/18 0700 In: 900 [P.O.:840; I.V.:60] Out: -  Intake/Output from this shift: Total I/O In: 120 [P.O.:120] Out: -   Labs:  Recent Labs  01/14/15 0436 01/15/15 1545  WBC 5.7 7.9  HGB 8.9* 10.8*  PLT 356 411*  CREATININE  --  7.96*   Estimated Creatinine Clearance: 10.2 mL/min (by C-G formula based on Cr of 7.96).  Recent Labs  01/15/15 1500  VANCORANDOM 18     Microbiology: Recent Results (from the past 720 hour(s))  Surgical pcr screen     Status: None   Collection Time: 12/18/14  7:56 AM  Result Value Ref Range Status   MRSA, PCR NEGATIVE NEGATIVE Final   Staphylococcus aureus NEGATIVE NEGATIVE Final    Comment:        The Xpert SA Assay (FDA approved for NASAL specimens in patients over 12 years of age), is one component of a comprehensive surveillance program.  Test performance has been validated by [facility] for patients greater than or equal to 58 year old. It is not intended to diagnose infection nor to guide or monitor treatment.   Culture, blood (routine x 2)     Status: None   Collection Time: 12/24/14 12:26 PM  Result Value Ref Range Status   Specimen Description BLOOD LEFT HAND  Final   Special Requests IN PEDIATRIC BOTTLE  [place]  Final   Culture NO GROWTH 5 DAYS  Final   Report Status 12/29/2014 FINAL  Final  Culture, blood (routine x 2)     Status: None   Collection Time: 12/24/14 12:40 PM  Result Value Ref Range Status   Specimen Description BLOOD RIGHT HAND  Final   Special Requests BOTTLES DRAWN AEROBIC AND ANAEROBIC 5CC  Final   Culture NO GROWTH 5 DAYS  Final   Report Status 12/29/2014 FINAL  Final  Culture, blood (routine x 2)     Status: None   Collection Time: 12/25/14  1:20 PM  Result Value Ref Range Status   Specimen Description BLOOD HEMODIALYSIS CATHETER  Final   Special Requests BOTTLES DRAWN AEROBIC AND ANAEROBIC 10CC  Final   Culture NO GROWTH 5 DAYS  Final   Report Status 12/30/2014 FINAL  Final  Culture, blood (routine x 2)     Status: None   Collection Time: 12/25/14  6:59 PM  Result Value Ref Range Status   Specimen Description BLOOD BLOOD RIGHT FOREARM  Final   Special Requests IN PEDIATRIC BOTTLE 1CC  Final   Culture NO GROWTH 5 DAYS  Final   Report Status 12/30/2014 FINAL  Final  Urine culture     Status: None   Collection Time: 12/29/14  7:26 AM  Result Value Ref Range Status   Specimen Description URINE, RANDOM  Final   Special Requests NONE  Final   Culture >=100,000 COLONIES/mL PSEUDOMONAS AERUGINOSA  Final   Report Status 01/01/2015 FINAL  Final   Organism ID, Bacteria PSEUDOMONAS AERUGINOSA  Final      Susceptibility   Pseudomonas aeruginosa -  MIC*    CEFTAZIDIME 4 SENSITIVE Sensitive     CIPROFLOXACIN 0.5 INTERMEDIATE Intermediate     GENTAMICIN <=1 SENSITIVE Sensitive     IMIPENEM 2 SENSITIVE Sensitive     PIP/TAZO 16 SENSITIVE Sensitive     CEFEPIME 8 SENSITIVE Sensitive     * >=100,000 COLONIES/mL PSEUDOMONAS AERUGINOSA  Urine culture     Status: None   Collection Time: 01/11/15  4:35 PM  Result Value Ref Range Status   Specimen Description URINE, CATHETERIZED  Final   Special Requests NONE  Final   Culture NO GROWTH 2 DAYS  Final   Report Status 01/13/2015 FINAL  Final  Culture, blood (routine x 2)     Status: None (Preliminary result)   Collection Time: 01/11/15  4:55 PM  Result Value Ref Range Status   Specimen Description BLOOD BLOOD LEFT HAND  Final   Special Requests BOTTLES DRAWN AEROBIC ONLY 5CC   Final   Culture NO GROWTH 4 DAYS  Final   Report Status PENDING  Incomplete  Culture, blood (routine x 2)     Status: None (Preliminary result)   Collection Time: 01/11/15  5:06 PM  Result Value Ref Range Status   Specimen Description BLOOD BLOOD RIGHT HAND  Final   Special Requests BOTTLES DRAWN AEROBIC ONLY 5CC  Final   Culture NO GROWTH 4 DAYS  Final   Report Status PENDING  Incomplete  Culture, blood (routine x 2)     Status: None (Preliminary result)   Collection Time: 01/13/15  3:20 PM  Result Value Ref Range Status   Specimen Description BLOOD PICC LINE  Final   Special Requests BOTTLES DRAWN AEROBIC AND ANAEROBIC 5CC  Final   Culture NO GROWTH 2 DAYS  Final   Report Status PENDING  Incomplete  Culture, blood (routine x 2)     Status: None (Preliminary result)   Collection Time: 01/14/15 11:34 AM  Result Value Ref Range Status   Specimen Description BLOOD RIGHT HAND  Final   Special Requests BOTTLES DRAWN AEROBIC AND ANAEROBIC 5CC  Final   Culture NO GROWTH < 24 HOURS  Final   Report Status PENDING  Incomplete  Culture, blood (routine x 2)     Status: None (Preliminary result)   Collection Time: 01/14/15 11:50 AM  Result Value Ref Range Status   Specimen Description BLOOD RIGHT FINGER  Final   Special Requests BOTTLES DRAWN AEROBIC AND ANAEROBIC 5CC  Final   Culture NO GROWTH < 24 HOURS  Final   Report Status PENDING  Incomplete  C difficile quick scan w PCR reflex     Status: None   Collection Time: 01/15/15  5:18 AM  Result Value Ref Range Status   C Diff antigen NEGATIVE NEGATIVE Final   C Diff toxin NEGATIVE NEGATIVE Final   C Diff interpretation Negative for toxigenic C. difficile  Final    Anti-infectives    Start     Dose/Rate Route Frequency Ordered Stop   01/11/15 1900  fluconazole (DIFLUCAN) tablet 100 mg     100 mg Oral Daily 01/11/15 1733 01/11/15 1900   01/11/15 1800  piperacillin-tazobactam (ZOSYN) IVPB 2.25 g     2.25 g 100 mL/hr over 30 Minutes  Intravenous 3 times per day 01/11/15 1726     01/11/15 1745  fluconazole (DIFLUCAN) tablet 100 mg  Status:  Discontinued     100 mg Oral Daily 01/11/15 1733 01/11/15 1733   01/08/15 1200  vancomycin (VANCOCIN) 1,250 mg in  sodium chloride 0.9 % 250 mL IVPB     1,250 mg 250 mL/hr over 60 Minutes Intravenous Every T-Th-Sa (Hemodialysis) 01/05/15 1712     01/06/15 0800  hydroxychloroquine (PLAQUENIL) tablet 400 mg     400 mg Oral Daily 01/05/15 1651     01/05/15 1800  vancomycin (VANCOCIN) IVPB 1000 mg/200 mL premix     1,000 mg 200 mL/hr over 60 Minutes Intravenous  Once 01/05/15 1712 01/05/15 1909      Assessment: 39 yo F with endocarditis. Plan is to continue vancomycin until 10/25. VR today before HD was therapeutic at 59. Seems to be tolerating current HD session well with BFR of 334ml/min. On day #5 of Zosyn for pseudomonas UTI.  Goal of Therapy:  Pre-HD VR of 15-25 Resolution of infection  Plan:  Continue vancomycin 1250mg  IV QHD-TTS Continue Zosyn 2.25g IV Q8 (EI) Monitor clinical picture, renal function, VR prn F/ULOT of Zosyn  Elenor Quinones, PharmD Clinical Pharmacist Pager [phone] 01/15/2015 6:03 PM

## 2015-01-15 NOTE — Progress Notes (Signed)
Echocardiogram 2D Echocardiogram has been performed.  [name] 01/15/2015, 3:33 PM

## 2015-01-15 NOTE — Progress Notes (Signed)
Pt complains of discomfort to abdomen with urge to void. PVR at 0500 for 1106ml. Pt requested I&O cath. RN attempted I&O cath with resistance and minimal urine output.  Unable to advance catheter to complete emptying of the bladder. Pt attempting to void. Will continue to monitor and assess.

## 2015-01-15 NOTE — Progress Notes (Signed)
Occupational Therapy Session Note  Patient Details  Name: [name] MRN: [id number] Date of Birth: 02/15/1976  Today's Date: 01/15/2015 OT Individual Time: 3976-7341 OT Individual Time Calculation (min): 55 min    Skilled Therapeutic Interventions/Progress Updates:    Pt performed simple meal prep using the rollator during session.  Pt needing mod instructional cueing for remembering to lock and unlock the rollator brakes as appropriate while working.  Needed multiple rest breaks during session by sitting on the rollator.  Pt with bowel accident during session as well.  She reported needing to go to the bathroom but before we could get to the bathroom pt demonstrated bowel incontinence in the hallway.  She needed max assistance for removing soiled TED stocking as well as removing and donning gripper sock.  She needed min assist as well for cleaning lower leg in the front and back.  Once finished returned to kitchen and continued with cooking muffins.  Pt needed mod instructional cueing to follow correct directions on the muffin packet.  She added 2 times the normal amount of milk secondary to not being able to problem solve how to get the proper amount.  When therapist discussed the error with her, she responded that the muffins would "turn out fine".  Pt returned to the room at end of session and then transferred to the bed.    Therapy Documentation Precautions:  Precautions Precautions: Fall Precaution Comments: Pt's femoral cath is tunneled therefore she is able to get OOB. Restrictions Weight Bearing Restrictions: No  Pain: Pain Assessment Pain Assessment: No/denies pain ADL: See Function Navigator for Current Functional Status.   Therapy/Group: Individual Therapy  Devon Pretty OTR/L 01/15/2015, 4:38 PM

## 2015-01-15 NOTE — Progress Notes (Signed)
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 Food Swelling    Red peppers    Patient Measurements: Height: 5\' 3"  (160 cm) Weight: 194 lb 0.1 oz (88 kg) IBW/kg (Calculated) : 52.4   Vital Signs: Temp: 98.6 F (37 C) (10/18 0500) Temp Source: Oral (10/18 0500) BP: 120/70 mmHg (10/18 0500) Pulse Rate: 101 (10/18 0500)  Labs:  Recent Labs  01/12/15 1559 01/13/15 0415 01/14/15 0436 01/15/15 0815  HGB 9.6*  --  8.9*  --   HCT 30.7*  --  28.0*  --   PLT 339  --  356  --   LABPROT  --  37.5* 42.5* 37.4*  INR  --  3.93* 4.64* 3.91*    Estimated Creatinine Clearance: 12.3 mL/min (by C-G formula based on Cr of 6.53).   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 INR down to 3.91 today, Hgb 8.9. Guaiac stool Positive. No active bleeding noted. Poor po intake.   Goal of Therapy:  INR 2 - 3 (per Dr. Azucena Freed note on 9/28) Monitor platelets by anticoagulation protocol: Yes   Plan:  -Hold coumadin today -Daily PT/INR  Maryanna Shape, PharmD, BCPS  Clinical Pharmacist  Pager: [phone]  01/15/2015 1:49 PM

## 2015-01-15 NOTE — Progress Notes (Signed)
Occupational Therapy Session Note  Patient Details  Name: [name] MRN: [id number] Date of Birth: 11-20-1975  Today's Date: 01/15/2015 OT Individual Time: 5465-6812 OT Individual Time Calculation (min): 23 min (make up session)   Short Term Goals: Week 2:  OT Short Term Goal 1 (Week 2): STG = LTGs due to remaining LOS  Skilled Therapeutic Interventions/Progress Updates:   Upon entering the room, pt supine in bed with c/o 5/10 pain in R knee. Pt just finished PT session and declined out of bed therapy this session secondary to increased pain and fatigue. Pt was agreeable to education regarding upcoming discharge. OT provided education regarding home health follow up recommendation with focus on pt advocacy, safety in home, and expectations. Pt verbalized understanding and asked questions as appropriate. Pt remained supine in bed with call bell and all needed items within reach.   Therapy Documentation Precautions:  Precautions Precautions: Fall Precaution Comments: Pt's femoral cath is tunneled therefore she is able to get OOB. Restrictions Weight Bearing Restrictions: No  See Function Navigator for Current Functional Status.   Therapy/Group: Individual Therapy  Phineas Semen 01/15/2015, 12:13 PM

## 2015-01-15 NOTE — Progress Notes (Signed)
Physical Therapy Session Note  Patient Details  Name: [name] MRN: [id number] Date of Birth: 05/23/1975  Today's Date: 01/15/2015 PT Individual Time: 1030-1130 and 1300-1330 PT Individual Time Calculation (min): 60 min and 30 min (total 90 min)   Short Term Goals: Week 2:  PT Short Term Goal 1 (Week 2): =LTG goals due to estimated length of stay  Skilled Therapeutic Interventions/Progress Updates:    1030-1130: Pt received supine in bed with no c/o pain, only reporting increased swelling in L knee. Supine>sit with supervision. Gait to apartment with rollator and supervision. Sit <>supine in bed x2 trials with supervision, with pt requiring mod verbal cues for setup to appropriately position in bed and reduce energy expenditure required to reposition. Seated rest break on couch. Knee high TEDs donned to LLE with verbal orders given by PA Dan for LLE swelling. After rest break, required extensive time and assistance to stand up from couch. Pt assisted minA initially and unable to stand due to poor forward trunk lean and weight shift over toes. Pt requests to try again without assistance; spends several minutes adjusting rollator, locking/unlocking brakes, adjusting position on edge of couch, several unsuccessful attempts to stand. Pt allows therapist to assist, and performed sit >stand with modA. Gait training for 2 trials, 100' and 160' with rollator and supervision. Requires 2-3 short standing rest breaks per trial, and extended seated rest break between trials due to fatigue. Pt requests to use restroom; assisted to/from bathroom with rollator and supervision. Returned to bed and remained supine in bed with all needs in reach and alarm intact at completion of session.    1300-1330: Pt received seated in bed eating lunch, agreeable to treatment and no c/o pain. Seated in bed transition to sit on EOB with bed rails, supervision and increased time. Gait with rollator to therapy gym x75' with  supervision. Stair training on 3-inch stairs, 2 trials with 8 stairs each with supervision. Seated rest break after each trial due to fatigue. Sit <>stand x3 reps with no hands and increased time to perform due to pt hesitancy. 1 set 10 reps sit <>stand with light UE support on rollator to improve pt confidence. Pt left seated on edge of mat table for rest break while waiting for next therapist to arrive, agreeable to remaining seated until therapist is present.  Therapy Documentation Precautions:  Precautions Precautions: Fall Precaution Comments: Pt's femoral cath is tunneled therefore she is able to get OOB. Restrictions Weight Bearing Restrictions: No Pain: Pain Assessment Pain Assessment: No/denies pain Pain Score: 0-No pain   See Function Navigator for Current Functional Status.   Therapy/Group: Individual Therapy  Luberta Mutter 01/15/2015, 12:14 PM

## 2015-01-15 NOTE — Progress Notes (Signed)
Occupational Therapy Session Note  Patient Details  Name: [name] MRN: [id number] Date of Birth: December 10, 1975  Today's Date: 01/15/2015 OT Individual Time: 0900-1000 OT Individual Time Calculation (min): 60 min    Short Term Goals: Week 2:  OT Short Term Goal 1 (Week 2): STG = LTGs due to remaining LOS  Skilled Therapeutic Interventions/Progress Updates:    ADL retraining with focus on functional mobility, use of AE, and increased independence with self-care tasks.  Pt reports not feeling well but with encouragement willing to get OOB for therapy.  Ambulated with Rollator to sink to complete bathing and dressing at setup level.  Pt utilized reacher with increased cues for hemi-technique with dressing and appropriate use of reacher.  Pt required increased time when provided distant supervision to progress towards Mod I, with constant supervision pt able to complete tasks more readily.  Required assist to don socks onto sock aid, but then pt able to don socks.  Requires encouragement to complete all LB dressing tasks.  Therapy Documentation Precautions:  Precautions Precautions: Fall Precaution Comments: Pt's femoral cath is tunneled therefore she is able to get OOB. Restrictions Weight Bearing Restrictions: No General:   Vital Signs: Therapy Vitals Temp: 99.3 F (37.4 C) Temp Source: Oral Pulse Rate: (!) 123 Resp: 18 BP: 127/78 mmHg Patient Position (if appropriate): Lying Oxygen Therapy SpO2: 100 % O2 Device: Not Delivered Pain: Pain Assessment Pain Assessment: No/denies pain Pain Score: 0-No pain  See Function Navigator for Current Functional Status.   Therapy/Group: Individual Therapy  Simonne Come 01/15/2015, 3:28 PM

## 2015-01-15 NOTE — Progress Notes (Signed)
Subjective: [name] is a 39yo F with PMH SLE, HTN, SVT who we were consulted for to evaluation of fever of unknown origin. She had no fevers overnight, and had no other complaints at this time. This morning, she says she did not have any nausea or abdominal pain as she has had previously. She denies subjective fevers, chills, chest pain, shortness of breath, any issues with bowel function or urination, or any other symptoms at this time.  Objective: Vital signs in last 24 hours: Filed Vitals:   01/14/15 1500 01/14/15 2100 01/14/15 2300 01/15/15 0500  BP: 123/62 136/69  120/70  Pulse: 109 107  101  Temp: 99 F (37.2 C)  99.3 F (37.4 C) 98.6 F (37 C)  TempSrc: Oral  Oral Oral  Resp: 18     Height:      Weight:    194 lb 0.1 oz (88 kg)  SpO2: 99%   100%   Weight change: -52 lb 14.6 oz (-24 kg)  Intake/Output Summary (Last 24 hours) at 01/15/15 1104 Last data filed at 01/15/15 0629  Gross per 24 hour  Intake    900 ml  Output      0 ml  Net    900 ml   BP 120/70 mmHg  Pulse 101  Temp(Src) 98.6 F (37 C) (Oral)  Resp 18  Ht 5\' 3"  (1.6 m)  Wt 194 lb 0.1 oz (88 kg)  BMI 34.38 kg/m2  SpO2 100%  LMP 11/27/2014  General Appearance:    Alert, cooperative, no distress, appears stated age   Heart:    Regular rate and rhythm, S1 and S2 normal, S4 heard best at left sternal border. No S3 heard. No murmurs or rubs heard.  Abdomen:     Soft, non-tender, bowel sounds active all four quadrants,    no masses, no organomegaly  Extremities:   Left leg and foot are apparently edematous and significantly larger in diameter than the right leg. Calf nontender to ankle dorsiflexion bilaterally. Intact left thigh permanent graph with good thrill and some bruising in the surrounding region. There is no bleeding, increased warmth, only mild tenderness, without any obvious signs of infection. Right thigh temporary cath c/d/i.   Pulses:   2+ and symmetric all extremities  Skin:   Skin color,  texture, turgor normal, no rashes or lesions  Neurologic:   CNII-XII intact, normal strength, sensation and reflexes    throughout   Lab Results: Basic Metabolic Panel:  CBC:  Recent Labs Lab 01/12/15 1559 01/14/15 0436  WBC 8.8 5.7  HGB 9.6* 8.9*  HCT 30.7* 28.0*  MCV 93.9 92.7  PLT 339 356   Coagulation:  Recent Labs Lab 01/12/15 0446 01/13/15 0415 01/14/15 0436 01/15/15 0815  LABPROT 36.6* 37.5* 42.5* 37.4*  INR 3.80* 3.93* 4.64* 3.91*   Assessment/Plan: Active Problems:   ESRD (end stage renal disease) on dialysis (HCC)   Endocarditis of mitral valve   Right middle cerebral artery stroke (HCC)   Left hemiparesis (HCC)   Post-operative pain   Adjustment disorder with depressed mood  Indeterminate fever: Patient had oral temperatures of 101.9 on 10/13, 102.9 and 103.2 on 10/14, spiked again to 101.2 on 10/16. CXR on 10/14 showed mild left basilar subsegmental atelectasis, no pleural effusion or consolidation noted. UA negative for UTI and no urinary symptoms. On 10/14, was found to have candidal vulvovaginitis, with one dose of fluconazole given. Patient is on empiric zosyn as well as continuing vancomycin for endocarditis  coverage. Her continued spiking fevers are possibly of infectious cause without obvious source, however lupus flares and drug-induced fever are possible considerations. She says her flare ups prior to hospitalization involved swelling in her hands and feet, but she does not recall any associated fevers when asked. She was previously pancytopenic with WBCs b/w 1-2 at the beginning of this month. WBC normalized now at 5.7. She does have increased leg swelling today on her left side, without any other symptoms, which may just be venous occlusion from her graft but may also be from thrombosis, which may also explain her spiking fevers. Another likely infectious source is her HD line infection. ID was consulted and recommended CT abdomen/pelvis and TTE for  further evaluation of indeterminate fever. CT A/P was negative for any acute changes and possible sources. Procalcitonin was elevated, suggesting bacterial infection. DVT of bilateral LE were negative, making thrombus as a source of fever less likely.  -Continue to monitor for fever, acetaminophen PRN -Continue vancomycin (last dose anticipated 10/25) and zosyn. If patient continues to be afebrile through today, will d/c zosyn and have patient finish out course of vancomycin -Follow-up echo -F/u BCx and UCx  Dispo: Disposition is deferred at this time, awaiting improvement of current medical problems.     LOS: 10 days   Norval Gable, MD 01/15/2015, 11:04 AM

## 2015-01-16 ENCOUNTER — Inpatient Hospital Stay (HOSPITAL_COMMUNITY): Payer: Medicaid Other | Admitting: Occupational Therapy

## 2015-01-16 ENCOUNTER — Inpatient Hospital Stay (HOSPITAL_COMMUNITY): Payer: Medicaid Other | Admitting: Physical Therapy

## 2015-01-16 DIAGNOSIS — I058 Other rheumatic mitral valve diseases: Secondary | ICD-10-CM

## 2015-01-16 LAB — CULTURE, BLOOD (ROUTINE X 2)
CULTURE: NO GROWTH
CULTURE: NO GROWTH

## 2015-01-16 LAB — PROTIME-INR
INR: 3.74 — AB (ref 0.00–1.49)
Prothrombin Time: 36.1 seconds — ABNORMAL HIGH (ref 11.6–15.2)

## 2015-01-16 NOTE — Progress Notes (Signed)
Subjective: [name] is a 39yo F with PMH SLE, HTN, SVT who we were consulted for to evaluation of fever of unknown origin. She had no fevers overnight, and had no other complaints at this time. She says her nausea is improving, and has no other complaints at this time. She says she is doing well with PT.   Objective: Vital signs in last 24 hours: Filed Vitals:   01/15/15 2200 01/16/15 0000 01/16/15 0536 01/16/15 0555  BP: 118/64  124/78   Pulse: 129 92 135 113  Temp:   98.1 F (36.7 C)   TempSrc:   Oral   Resp: 18  18   Height:      Weight:   187 lb 2.7 oz (84.9 kg)   SpO2:   99%    Weight change: 4 lb 13.6 oz (2.2 kg)  Intake/Output Summary (Last 24 hours) at 01/16/15 1040 Last data filed at 01/16/15 0544  Gross per 24 hour  Intake    140 ml  Output   1882 ml  Net  -1742 ml   BP 124/78 mmHg  Pulse 113  Temp(Src) 98.1 F (36.7 C) (Oral)  Resp 18  Ht 5\' 3"  (1.6 m)  Wt 187 lb 2.7 oz (84.9 kg)  BMI 33.16 kg/m2  SpO2 99%  LMP 11/27/2014  General Appearance:    Alert, cooperative, no distress, appears stated age   Heart:    Regular rate and rhythm, S1 and S2 normal, S4 heard best at left sternal border. No S3 heard. No murmurs or rubs heard.  Abdomen:     Soft, non-tender, bowel sounds active all four quadrants,    no masses, no organomegaly  Extremities:   Left leg and foot are apparently edematous and significantly larger in diameter than the right leg. Calf nontender to ankle dorsiflexion bilaterally. Intact left thigh permanent graph with good thrill and some bruising in the surrounding region. There is no bleeding, increased warmth, only mild tenderness, without any obvious signs of infection. Right thigh temporary cath c/d/i.   Pulses:   2+ and symmetric all extremities  Skin:   Skin color, texture, turgor normal, no rashes or lesions  Neurologic:   CNII-XII intact, normal strength, sensation and reflexes    throughout   Lab Results: Basic Metabolic  Panel:  CBC:  Recent Labs Lab 01/14/15 0436 01/15/15 1545  WBC 5.7 7.9  HGB 8.9* 10.8*  HCT 28.0* 33.4*  MCV 92.7 93.8  PLT 356 411*   Coagulation:  Recent Labs Lab 01/13/15 0415 01/14/15 0436 01/15/15 0815 01/16/15 0530  LABPROT 37.5* 42.5* 37.4* 36.1*  INR 3.93* 4.64* 3.91* 3.74*   Assessment/Plan: Active Problems:   ESRD (end stage renal disease) on dialysis (HCC)   Endocarditis of mitral valve   Right middle cerebral artery stroke (HCC)   Left hemiparesis (HCC)   Post-operative pain   Adjustment disorder with depressed mood  Indeterminate fever: Patient had oral temperatures of 101.9 on 10/13, 102.9 and 103.2 on 10/14, spiked again to 101.2 on 10/16. CXR on 10/14 showed mild left basilar subsegmental atelectasis, no pleural effusion or consolidation noted. UA negative for UTI and no urinary symptoms. On 10/14, was found to have candidal vulvovaginitis, with one dose of fluconazole given. Patient is on empiric zosyn as well as continuing vancomycin for endocarditis coverage. Her continued spiking fevers are possibly of infectious cause without obvious source, however lupus flares and drug-induced fever are possible considerations. She says her flare ups prior to hospitalization  involved swelling in her hands and feet, but she does not recall any associated fevers when asked. She was previously pancytopenic with WBCs b/w 1-2 at the beginning of this month. WBC normalized now at 5.7. She does have increased leg swelling today on her left side, without any other symptoms, which may just be venous occlusion from her graft but may also be from thrombosis, which may also explain her spiking fevers. Another likely infectious source is her HD line infection. ID was consulted and recommended CT abdomen/pelvis and TTE for further evaluation of indeterminate fever. CT A/P was negative for any acute changes and possible sources. Procalcitonin was elevated, suggesting bacterial infection. DVT  of bilateral LE were negative, making thrombus as a source of fever less likely. Echo on 10/18 shows resolution/no evidence of mitral valve vegetations. -Continue to monitor for fever, acetaminophen PRN -Continue vancomycin (last dose anticipated 10/25) -Discontinue zosyn today -F/u BCx and UCx -If patient re-spikes fever, re-draw cultures from HD line as this is the most likely source of her fever at this time  Dispo: Disposition is deferred at this time, awaiting improvement of current medical problems.     LOS: 11 days   Norval Gable, MD 01/16/2015, 10:40 AM

## 2015-01-16 NOTE — Progress Notes (Signed)
1. CVA- doing well in rehab 2. ESRD- continue with HD qTTS (first HD session 12/07/14); next tx on Tuesday. 3. Anemia: s/p PRBCs 4. Vascular access- left femoral AVG placed 01/04/15 by Dr. Donnetta Hutching as well as right femoral tunneled HD cath 5. Hypertension:stable off amlodipine 6. Endocarditis- presumably Liebman-Sachs 7. SLE- biopsy on 8/25 & biopsy was complicated by a hematoma and it revealed again diffuse proliferative changes with crescents indicating that it could respond to immune suppressing therapy. These agents have been weaned off as she has progressed to [place]. 8. SVT- on beta blocker 9. Fever of uncertain cause, improved  Subjective: Working well with OT today  Objective: Vital signs in last 24 hours: Temp:  [98.1 F (36.7 C)-99.3 F (37.4 C)] 98.1 F (36.7 C) (10/19 0536) Pulse Rate:  [92-140] 113 (10/19 0555) Resp:  [18-19] 18 (10/19 0536) BP: (100-166)/(56-86) 124/78 mmHg (10/19 0536) SpO2:  [99 %-100 %] 99 % (10/19 0536) Weight:  [84.9 kg (187 lb 2.7 oz)-90.2 kg (198 lb 13.7 oz)] 84.9 kg (187 lb 2.7 oz) (10/19 0536) Weight change: 2.2 kg (4 lb 13.6 oz)  Intake/Output from previous day: 10/18 0701 - 10/19 0700 In: 140 [P.O.:120; I.V.:20] Out: 1882  Intake/Output this shift:   Flat affect Ambulating with walker slowly Left leg edematous, right leg no edema Lab Results:  Recent Labs  01/14/15 0436 01/15/15 1545  WBC 5.7 7.9  HGB 8.9* 10.8*  HCT 28.0* 33.4*  PLT 356 411*   BMET:  Recent Labs  01/15/15 1545  NA 133*  K 4.1  CL 97*  CO2 24  GLUCOSE 79  BUN 22*  CREATININE 7.96*  CALCIUM 8.1*   No results for input(s): PTH in the last 72 hours. Iron Studies: No results for input(s): IRON, TIBC, TRANSFERRIN, FERRITIN in the last 72 hours. Studies/Results: Ct Abdomen Pelvis Wo Contrast  01/15/2015  CLINICAL DATA:  Acute onset of fever, nausea and abdominal discomfort. Initial encounter. EXAM: CT ABDOMEN AND PELVIS WITHOUT CONTRAST TECHNIQUE:  Multidetector CT imaging of the abdomen and pelvis was performed following the standard protocol without IV contrast. COMPARISON:  CT of the abdomen and pelvis from 12/03/2014 FINDINGS: A small left pleural effusion is noted, with left basilar airspace opacity likely reflecting atelectasis. The liver and spleen are unremarkable in appearance. The gallbladder is within normal limits. The pancreas and adrenal glands are unremarkable. Nonspecific perinephric stranding is noted bilaterally, with trace associated fluid tracking along Gerota's fascia. No renal or ureteral stones are identified. No free fluid is identified. The small bowel is unremarkable in appearance. The stomach is within normal limits. No acute vascular abnormalities are seen. The patient is status post appendectomy. The colon is unremarkable in appearance. The bladder is decompressed and not well assessed. The uterus is unremarkable in appearance. The ovaries are grossly symmetric. No suspicious adnexal masses are seen. No inguinal lymphadenopathy is seen. Soft tissue edema is noted at both flanks, and diffuse soft tissue inflammation is seen tracking along the proximal left thigh. Postoperative change is noted about the left inguinal region, with underlying vascular calcification. A right femoral line is noted ending at the infrarenal IVC. No acute osseous abnormalities are identified. IMPRESSION: 1. No acute abnormality seen to explain the patient's symptoms. 2. Soft tissue edema at both flanks. Diffuse soft inflammation tracks along the proximal left thigh. Postoperative change at the left inguinal region. Would correlate for any evidence of venous obstruction or cellulitis. 3. Small left pleural effusion, with left basilar airspace opacity likely  reflecting atelectasis. 4. Nonspecific bilateral perinephric stranding is relatively stable and likely reflects the patient's baseline. Electronically Signed   By: Garald Balding M.D.   On: 01/15/2015  03:00   Scheduled: . sodium chloride   Intravenous Once  . atorvastatin  20 mg Oral q1800  . bisacodyl  5 mg Oral BID  . cyclobenzaprine  10 mg Oral QHS  . darbepoetin (ARANESP) injection - DIALYSIS  200 mcg Intravenous Q Thu-HD  . feeding supplement  1 Container Oral TID BM  . feeding supplement (PRO-STAT SUGAR FREE 64)  30 mL Oral Daily  . hydroxychloroquine  400 mg Oral Daily  . isosorbide mononitrate  30 mg Oral Daily  . metoprolol succinate  25 mg Oral QHS  . multivitamin  1 tablet Oral QHS  . pantoprazole  20 mg Oral Daily  . vancomycin  1,250 mg Intravenous Q T,Th,Sa-HD  . Warfarin - Pharmacist Dosing Inpatient   Does not apply q1800     LOS: 11 days   Cordarro Spinnato C 01/16/2015,1:48 PM

## 2015-01-16 NOTE — Progress Notes (Signed)
Social Work Patient ID: [name], female   DOB: 16-Oct-1975, 39 y.o.   MRN: [id number] Met with pt to discuss team conference and meeting her goals, awaiting her medical clearance for discharge tomorrow. Dr. Letta Pate feels after talking with medical team she will be ready to go tomorrow.  Have contacted [facility] to get pt on first run tomorrow. Pt aware she would be ready if cleared after lunch.  Equipment has been delivered and follow up therapies set up. Pt would like to go home tomorrow.

## 2015-01-16 NOTE — Progress Notes (Signed)
Occupational Therapy Session Note  Patient Details  Name: [name] MRN: [id number] Date of Birth: 1975-11-23  Today's Date: 01/16/2015 OT Individual Time: 5449-2010 and 1305-1400 OT Individual Time Calculation (min): 60 min and 55 min   Short Term Goals: Week 2:  OT Short Term Goal 1 (Week 2): STG = LTGs due to remaining LOS  Skilled Therapeutic Interventions/Progress Updates:    1) Completed ADL retraining with focus on increased independence with toilet transfers and self-care tasks of bathing and dressing.  Pt demonstrated appropriate use of reacher, sock aid, and long handled sponge to complete LB bathing and dressing tasks with increased time.  Pt able to recall hemi-dressing technique with LB dressing with increased time and after attempt to don Rt leg first.  Pt reports need to toilet, ambulated to bathroom with Rollator and completed toilet transfer and toileting with increased time and no assist.  Discussed recommendation for mother to be present during household tasks due to memory concerns and problem solving difficulty that became evident during meal prep task yesterday.  Pt reports her mother always assists with cooking due to "forgetfulness".  2) Treatment session with focus on functional mobility in home environment and home making tasks.  Pt required increased time and encouragement to get OOB to participate in treatment session.  Ambulated to ADL apt with Rollator without assist.  Engaged in simple meal prep, making pancakes, with pt able to retrieve items with only cues to locate items.  Pt demonstrated improved problem solving and awareness with meal prep this session, although still recommend mother be present in kitchen during cooking tasks.  Good recall of education with use of counters to transport items.  Pt required occasional seated rests which she took safely on Rollator while still managing cooking task.  Pt completed house keeping tasks with washing dishes, placing  them in dishwasher and putting "laundry" away in dresser drawers.  Discussed laundry setup and use of Rollator seat to transport laundry to increase safety while ensuring stability with Rollator.  Pt able to demonstrate tub/shower transfer at supervision level with use of tub bench and Rollator, pt required use of towel to assist in bringing LLE over tub ledge.    Therapy Documentation Precautions:  Precautions Precautions: Fall Precaution Comments: Pt's femoral cath is tunneled therefore she is able to get OOB. Restrictions Weight Bearing Restrictions: No General:   Vital Signs: Therapy Vitals Pulse Rate: (!) 113 Pain: Pain Assessment Pain Assessment: 0-10 Pain Score: 5  Pain Type: Acute pain Pain Location: Abdomen Pain Descriptors / Indicators: Cramping Pain Onset: Other (Comment) (after a bowel movt) Pain Intervention(s): Medication (See eMAR)  See Function Navigator for Current Functional Status.   Therapy/Group: Individual Therapy  Simonne Come 01/16/2015, 9:50 AM

## 2015-01-16 NOTE — Progress Notes (Signed)
Physical Therapy Discharge Summary  Patient Details  Name: [name] MRN: [id number] Date of Birth: [birth year]  Today's Date: 01/16/2015 PT Individual Time: 1430-1500 and 1000-1100  PT Individual Time Calculation (min): 30 min and 60 min (total 90 min)   Patient has met 7 of 8 long term goals due to improved activity tolerance, improved balance, improved postural control, increased strength, increased range of motion, decreased pain, ability to compensate for deficits, functional use of  left upper extremity and left lower extremity and improved awareness.  Patient to discharge at an ambulatory level Modified Independent.   No family present during therapy sessions, however patient reports mother feels comfortable providing supervision in the home.   Reasons goals not met: Goal of furniture transfers with mod I unmet due to pt difficulty getting up off low surface; requires supervision and min cues for hand placement and sequencing.  Recommendation:  Patient will benefit from ongoing skilled PT services in home health setting to continue to advance safe functional mobility, address ongoing impairments in activity tolerance, strength, balance, and minimize fall risk.  Equipment: rollator  Reasons for discharge: treatment goals met and discharge from hospital  Patient/family agrees with progress made and goals achieved: Yes  PT Discharge Precautions/Restrictions Precautions Precautions: Fall Restrictions Weight Bearing Restrictions: No Pain Pain Assessment Pain Assessment: 0-10 Pain Score: 3  Faces Pain Scale: Hurts a little bit Pain Type: Surgical pain Pain Location: Leg Pain Orientation: Left Pain Descriptors / Indicators: Aching Pain Onset: On-going Patients Stated Pain Goal: 0 Pain Intervention(s): Ambulation/increased activity;Repositioned Multiple Pain Sites: No Vision/Perception  Perception Comments: WNL  Cognition Overall Cognitive Status: Within Functional  Limits for tasks assessed Arousal/Alertness: Awake/alert Orientation Level: Oriented X4 Attention: Selective Selective Attention: Appears intact Memory: Impaired Memory Impairment: Decreased recall of new information;Decreased short term memory Awareness: Appears intact Problem Solving: Impaired Safety/Judgment: Appears intact Sensation Sensation Light Touch: Appears Intact Proprioception: Appears Intact (accurate 5/5 BLE hallux) Coordination Gross Motor Movements are Fluid and Coordinated: Yes Fine Motor Movements are Fluid and Coordinated: Yes Heel Shin Test: decreased excursion bilaterally due to strength impairments, otherwise WNL Motor  Motor Motor: Abnormal postural alignment and control Motor - Discharge Observations: kyphotic posture, rounded shoulders, decreased righting and protective responses  Mobility Bed Mobility Bed Mobility: Rolling Left;Left Sidelying to Sit;Supine to Sit Rolling Left: 6: Modified independent (Device/Increase time) Left Sidelying to Sit: 6: Modified independent (Device/Increase time) Supine to Sit: 6: Modified independent (Device/Increase time) Supine to Sit Details: Verbal cues for precautions/safety Sit to Supine: 5: Supervision Sit to Supine - Details: Verbal cues for technique Transfers Transfers: Yes Sit to Stand: 6: Modified independent (Device/Increase time) Sit to Stand Details (indicate cue type and reason): one hand on rollator, other on seated surface Stand to Sit: 6: Modified independent (Device/Increase time) Stand Pivot Transfers: 6: Modified independent (Device/Increase time) Locomotion  Ambulation Ambulation: Yes Ambulation/Gait Assistance: 6: Modified independent (Device/Increase time) Ambulation Distance (Feet): 202 Feet Assistive device: Other (Comment) (rollator) Gait Gait: Yes Gait Pattern: Impaired Gait Pattern: Trunk flexed;Poor foot clearance - right;Poor foot clearance - left;Lateral hip instability;Lateral trunk  lean to left;Lateral trunk lean to right;Decreased stride length Gait velocity: 1.31 ft/sec Stairs / Additional Locomotion Stairs: Yes Stairs Assistance: 5: Supervision Stairs Assistance Details: Verbal cues for technique Stairs Assistance Details (indicate cue type and reason): cues for posture, correct lead LE Number of Stairs: 12 Height of Stairs: 3 Ramp: 5: Supervision Curb: 5: Supervision Wheelchair Mobility Wheelchair Mobility: No (Pt ambulatory)  Trunk/Postural Assessment  Cervical  Assessment Cervical Assessment: Exceptions to [facility] (forward head posture) Thoracic Assessment Thoracic Assessment: Exceptions to [facility] (increased kyphosis) Lumbar Assessment Lumbar Assessment: Exceptions to [facility] (reversal or lumbar lordosis, posterior pelvic tilt) Postural Control Postural Control: Deficits on evaluation Postural Limitations: decreased strength, power production for righting reactions, stepping strategies  Balance Balance Balance Assessed: Yes Standardized Balance Assessment Standardized Balance Assessment: Berg Balance Test Berg Balance Test Sit to Stand: Able to stand  independently using hands Standing Unsupported: Able to stand 2 minutes with supervision Sitting with Back Unsupported but Feet Supported on Floor or Stool: Able to sit safely and securely 2 minutes Stand to Sit: Controls descent by using hands Transfers: Able to transfer safely, minor use of hands Standing Unsupported with Eyes Closed: Able to stand 10 seconds with supervision Standing Ubsupported with Feet Together: Able to place feet together independently and stand for 1 minute with supervision From Standing, Reach Forward with Outstretched Arm: Reaches forward but needs supervision From Standing Position, Pick up Object from Floor: Unable to pick up shoe, but reaches 2-5 cm (1-2") from shoe and balances independently From Standing Position, Turn to Look Behind Over each Shoulder: Looks behind from both sides  and weight shifts well Turn 360 Degrees: Needs close supervision or verbal cueing Standing Unsupported, Alternately Place Feet on Step/Stool: Able to complete >2 steps/needs minimal assist Standing Unsupported, One Foot in Front: Able to take small step independently and hold 30 seconds Standing on One Leg: Tries to lift leg/unable to hold 3 seconds but remains standing independently Total Score: 35 Static Sitting Balance Static Sitting - Balance Support: Feet supported Static Sitting - Level of Assistance: 6: Modified independent (Device/Increase time) Dynamic Sitting Balance Dynamic Sitting - Balance Support: Feet supported Dynamic Sitting - Level of Assistance: 6: Modified independent (Device/Increase time) Dynamic Sitting - Balance Activities: Reaching for objects;Reaching across midline;Lateral lean/weight shifting;Forward lean/weight shifting Static Standing Balance Static Standing - Balance Support: No upper extremity supported;During functional activity Static Standing - Level of Assistance: 6: Modified independent (Device/Increase time) Static Standing - Comment/# of Minutes: x2 min in Berg Dynamic Standing Balance Dynamic Standing - Balance Support: During functional activity;No upper extremity supported Dynamic Standing - Level of Assistance: 5: Stand by assistance Dynamic Standing - Balance Activities: Reaching for objects;Forward lean/weight shifting Dynamic Standing - Comments: mod I with UE support on rollator Extremity Assessment  RUE Assessment RUE Assessment: Within Functional Limits LUE Assessment LUE Assessment: Exceptions to [facility] (Shoulder flexion ROM limited; see OT note for details) LUE AROM (degrees) Overall AROM Left Upper Extremity: Deficits LUE Overall AROM Comments: grossly WFL except Shoulder to 120 degrees abd/flex LUE Strength LUE Overall Strength: Deficits LUE Overall Strength Comments: grossly 4-/5 except shoulder 3/5 RLE Assessment RLE Assessment:  Exceptions to [facility] (grossly 4+/5 throughout) LLE Assessment LLE Assessment: Exceptions to [facility] LLE Strength LLE Overall Strength: Deficits;Due to pain LLE Overall Strength Comments: grossly 4-/5 throughout  Skilled Therapeutic Intervention: 1000-1100: Pt received supine in bed with no c/o pain and agreeable to treatment. Pt attempting to eat breakfast in supine position; cued pt in bed mobility to come to seated position on EOB to finish breakfast. While pt was eating, therapist educated pt in new equipment with rollator present in room. Adjusted height of handles to fit patient, discussed use and safety with device, as well as demonstrating storage position for putting rollator into car. Gait throughout session with rollator and mod I. Performed car transfer with mod I and increased time due to difficulty getting feet into  car. Stairs x12 3-inch stairs with supervision and min verbal cues for posture and sequencing with stronger lead LE. Performed Berg Balance Scale; see above for details. Pt returned to room and remained supine in bed with all needs within reach at completion of session.  1430-1500: Pt received supine in bed with no c/o pain and agreeable to treatment. Bed mobility performed without bedrails; required supervision and increased time with cues for sequencing and technique. Gait speed assessed as above. Gait x202' with rollator and mod I. Gait on unlevel surface with minA x10'. Pt ambulated back to room and returned to supine position in bed with supervision. Remained supine with all needs within reach at completion of session.   See Function Navigator for Current Functional Status.  Benjiman Core Tygielski 01/16/2015, 3:58 PM

## 2015-01-16 NOTE — Progress Notes (Signed)
Subjective/Complaints: No CP or SOB C/o loose stool x 2  Afebrile x >24H    ROS- neg SOB, Constipation, pain, no sweats or chills, no pain at line sites  Objective: Vital Signs: Blood pressure 124/78, pulse 113, temperature 98.1 F (36.7 C), temperature source Oral, resp. rate 18, height 5' 3"  (1.6 m), weight 84.9 kg (187 lb 2.7 oz), last menstrual period 11/27/2014, SpO2 99 %. Ct Abdomen Pelvis Wo Contrast  01/15/2015  CLINICAL DATA:  Acute onset of fever, nausea and abdominal discomfort. Initial encounter. EXAM: CT ABDOMEN AND PELVIS WITHOUT CONTRAST TECHNIQUE: Multidetector CT imaging of the abdomen and pelvis was performed following the standard protocol without IV contrast. COMPARISON:  CT of the abdomen and pelvis from 12/03/2014 FINDINGS: A small left pleural effusion is noted, with left basilar airspace opacity likely reflecting atelectasis. The liver and spleen are unremarkable in appearance. The gallbladder is within normal limits. The pancreas and adrenal glands are unremarkable. Nonspecific perinephric stranding is noted bilaterally, with trace associated fluid tracking along Gerota's fascia. No renal or ureteral stones are identified. No free fluid is identified. The small bowel is unremarkable in appearance. The stomach is within normal limits. No acute vascular abnormalities are seen. The patient is status post appendectomy. The colon is unremarkable in appearance. The bladder is decompressed and not well assessed. The uterus is unremarkable in appearance. The ovaries are grossly symmetric. No suspicious adnexal masses are seen. No inguinal lymphadenopathy is seen. Soft tissue edema is noted at both flanks, and diffuse soft tissue inflammation is seen tracking along the proximal left thigh. Postoperative change is noted about the left inguinal region, with underlying vascular calcification. A right femoral line is noted ending at the infrarenal IVC. No acute osseous abnormalities  are identified. IMPRESSION: 1. No acute abnormality seen to explain the patient's symptoms. 2. Soft tissue edema at both flanks. Diffuse soft inflammation tracks along the proximal left thigh. Postoperative change at the left inguinal region. Would correlate for any evidence of venous obstruction or cellulitis. 3. Small left pleural effusion, with left basilar airspace opacity likely reflecting atelectasis. 4. Nonspecific bilateral perinephric stranding is relatively stable and likely reflects the patient's baseline. Electronically Signed   By: Garald Balding M.D.   On: 01/15/2015 03:00   Results for orders placed or performed during the hospital encounter of 01/05/15 (from the past 72 hour(s))  Culture, blood (routine x 2)     Status: None (Preliminary result)   Collection Time: 01/13/15  3:20 PM  Result Value Ref Range   Specimen Description BLOOD PICC LINE    Special Requests BOTTLES DRAWN AEROBIC AND ANAEROBIC 5CC    Culture NO GROWTH 2 DAYS    Report Status PENDING   Protime-INR     Status: Abnormal   Collection Time: 01/14/15  4:36 AM  Result Value Ref Range   Prothrombin Time 42.5 (H) 11.6 - 15.2 seconds   INR 4.64 (H) 0.00 - 1.49  CBC     Status: Abnormal   Collection Time: 01/14/15  4:36 AM  Result Value Ref Range   WBC 5.7 4.0 - 10.5 K/uL   RBC 3.02 (L) 3.87 - 5.11 MIL/uL   Hemoglobin 8.9 (L) 12.0 - 15.0 g/dL   HCT 28.0 (L) 36.0 - 46.0 %   MCV 92.7 78.0 - 100.0 fL   MCH 29.5 26.0 - 34.0 pg   MCHC 31.8 30.0 - 36.0 g/dL   RDW 17.1 (H) 11.5 - 15.5 %   Platelets 356 150 -  400 K/uL  Culture, blood (routine x 2)     Status: None (Preliminary result)   Collection Time: 01/14/15 11:34 AM  Result Value Ref Range   Specimen Description BLOOD RIGHT HAND    Special Requests BOTTLES DRAWN AEROBIC AND ANAEROBIC 5CC    Culture NO GROWTH < 24 HOURS    Report Status PENDING   Sedimentation rate     Status: Abnormal   Collection Time: 01/14/15 11:50 AM  Result Value Ref Range   Sed Rate  26 (H) 0 - 22 mm/hr  C-reactive protein     Status: Abnormal   Collection Time: 01/14/15 11:50 AM  Result Value Ref Range   CRP 6.1 (H) <1.0 mg/dL  Procalcitonin - Baseline     Status: None   Collection Time: 01/14/15 11:50 AM  Result Value Ref Range   Procalcitonin 3.96 ng/mL    Comment:        Interpretation: PCT > 2 ng/mL: Systemic infection (sepsis) is likely, unless other causes are known. (NOTE)         ICU PCT Algorithm               Non ICU PCT Algorithm    ----------------------------     ------------------------------         PCT < 0.25 ng/mL                 PCT < 0.1 ng/mL     Stopping of antibiotics            Stopping of antibiotics       strongly encouraged.               strongly encouraged.    ----------------------------     ------------------------------       PCT level decrease by               PCT < 0.25 ng/mL       >= 80% from peak PCT       OR PCT 0.25 - 0.5 ng/mL          Stopping of antibiotics                                             encouraged.     Stopping of antibiotics           encouraged.    ----------------------------     ------------------------------       PCT level decrease by              PCT >= 0.25 ng/mL       < 80% from peak PCT        AND PCT >= 0.5 ng/mL            Continuing antibiotics                                               encouraged.       Continuing antibiotics            encouraged.    ----------------------------     ------------------------------     PCT level increase compared          PCT > 0.5 ng/mL  with peak PCT AND          PCT >= 0.5 ng/mL             Escalation of antibiotics                                          strongly encouraged.      Escalation of antibiotics        strongly encouraged.   Culture, blood (routine x 2)     Status: None (Preliminary result)   Collection Time: 01/14/15 11:50 AM  Result Value Ref Range   Specimen Description BLOOD RIGHT FINGER    Special Requests BOTTLES DRAWN  AEROBIC AND ANAEROBIC 5CC    Culture NO GROWTH < 24 HOURS    Report Status PENDING   Pregnancy, urine     Status: None   Collection Time: 01/14/15  5:28 PM  Result Value Ref Range   Preg Test, Ur NEGATIVE NEGATIVE    Comment:        THE SENSITIVITY OF THIS METHODOLOGY IS >20 mIU/mL.   C difficile quick scan w PCR reflex     Status: None   Collection Time: 01/15/15  5:18 AM  Result Value Ref Range   C Diff antigen NEGATIVE NEGATIVE   C Diff toxin NEGATIVE NEGATIVE   C Diff interpretation Negative for toxigenic C. difficile   Protime-INR     Status: Abnormal   Collection Time: 01/15/15  8:15 AM  Result Value Ref Range   Prothrombin Time 37.4 (H) 11.6 - 15.2 seconds   INR 3.91 (H) 0.00 - 1.49  Vancomycin, random     Status: None   Collection Time: 01/15/15  3:00 PM  Result Value Ref Range   Vancomycin Rm 18 ug/mL    Comment:        Random Vancomycin therapeutic range is dependent on dosage and time of specimen collection. A peak range is 20.0-40.0 ug/mL A trough range is 5.0-15.0 ug/mL          Renal function panel     Status: Abnormal   Collection Time: 01/15/15  3:45 PM  Result Value Ref Range   Sodium 133 (L) 135 - 145 mmol/L   Potassium 4.1 3.5 - 5.1 mmol/L   Chloride 97 (L) 101 - 111 mmol/L   CO2 24 22 - 32 mmol/L   Glucose, Bld 79 65 - 99 mg/dL   BUN 22 (H) 6 - 20 mg/dL   Creatinine, Ser 7.96 (H) 0.44 - 1.00 mg/dL   Calcium 8.1 (L) 8.9 - 10.3 mg/dL   Phosphorus 3.4 2.5 - 4.6 mg/dL   Albumin 1.9 (L) 3.5 - 5.0 g/dL   GFR calc non Af Amer 6 (L) >60 mL/min   GFR calc Af Amer 7 (L) >60 mL/min    Comment: (NOTE) The eGFR has been calculated using the CKD EPI equation. This calculation has not been validated in all clinical situations. eGFR's persistently <60 mL/min signify possible Chronic Kidney Disease.    Anion gap 12 5 - 15  CBC     Status: Abnormal   Collection Time: 01/15/15  3:45 PM  Result Value Ref Range   WBC 7.9 4.0 - 10.5 K/uL   RBC 3.56 (L) 3.87 -  5.11 MIL/uL   Hemoglobin 10.8 (L) 12.0 - 15.0 g/dL   HCT 33.4 (L) 36.0 - 46.0 %  MCV 93.8 78.0 - 100.0 fL   MCH 30.3 26.0 - 34.0 pg   MCHC 32.3 30.0 - 36.0 g/dL   RDW 17.7 (H) 11.5 - 15.5 %   Platelets 411 (H) 150 - 400 K/uL  Protime-INR     Status: Abnormal   Collection Time: 01/16/15  5:30 AM  Result Value Ref Range   Prothrombin Time 36.1 (H) 11.6 - 15.2 seconds   INR 3.74 (H) 0.00 - 1.49     HEENT: normal Cardio: RRR and no murmurs Resp: CTA B/L and unlabored GI: BS positive and non tender Extremity:  Pulses positive and No Edema, bruising Left thigh around surgical site, tenderness lateral aspect of graft, good bruit Skin:   Intact Neuro: Alert/Oriented, Flat and Abnormal Motor 4-/5 LUE and LLE, 5- in RUE and RLE Musc/Skel:  Other no pain with AROM in UE or LEs Gen NAD   Assessment/Plan: 1. Functional deficits secondary to Right MCA which require 3+ hours per day of interdisciplinary therapy in a comprehensive inpatient rehab setting. Physiatrist is providing close team supervision and 24 hour management of active medical problems listed below. Physiatrist and rehab team continue to assess barriers to discharge/monitor patient progress toward functional and medical goals.  Team conference today please see physician documentation under team conference tab, met with team face-to-face to discuss problems,progress, and goals. Formulized individual treatment plan based on medical history, underlying problem and comorbidities. FIM: Function - Bathing Position: Wheelchair/chair at sink Body parts bathed by patient: Right arm, Left arm, Chest, Abdomen, Front perineal area, Buttocks, Right upper leg, Left upper leg, Right lower leg, Left lower leg Body parts bathed by helper: Back Bathing not applicable: Back Assist Level: Supervision or verbal cues  Function- Upper Body Dressing/Undressing What is the patient wearing?: Pull over shirt/dress, Bra Bra - Perfomed by patient:  Thread/unthread right bra strap, Thread/unthread left bra strap, Hook/unhook bra (pull down sports bra) Bra - Perfomed by helper: Hook/unhook bra (pull down sports bra) Pull over shirt/dress - Perfomed by patient: Thread/unthread right sleeve, Thread/unthread left sleeve, Put head through opening, Pull shirt over trunk Assist Level: Set up Set up : To obtain clothing/put away Function - Lower Body Dressing/Undressing What is the patient wearing?: Underwear, Non-skid slipper socks (skirt) Position: Wheelchair/chair at sink Underwear - Performed by patient: Thread/unthread right underwear leg, Thread/unthread left underwear leg, Pull underwear up/down (with use of reacher) Underwear - Performed by helper: Thread/unthread right underwear leg, Thread/unthread left underwear leg (patient needed assist to get both legs in skirt) Non-skid slipper socks- Performed by patient: Don/doff right sock, Don/doff left sock (with use of sock aid) Non-skid slipper socks- Performed by helper: Don/doff right sock, Don/doff left sock Socks - Performed by helper: Don/doff right sock, Don/doff left sock Shoes - Performed by helper: Don/doff right shoe, Don/doff left shoe, Fasten right, Fasten left Assist for footwear: Setup (with use of AE) Assist for lower body dressing: Supervision or verbal cues  Function - Toileting Toileting activity did not occur: Safety/medical concerns Toileting steps completed by patient: Adjust clothing prior to toileting, Performs perineal hygiene, Adjust clothing after toileting Toileting steps completed by helper: Performs perineal hygiene Assist level: Supervision or verbal cues  Function - Air cabin crew transfer activity did not occur: Safety/medical concerns Toilet transfer assistive device: Elevated toilet seat/BSC over toilet, Grab bar, Walker Assist level to toilet: Supervision or verbal cues Assist level from toilet: Supervision or verbal cues Assist level to  bedside commode (at bedside): Moderate assist (Pt 50 -  74%/lift or lower) Assist level from bedside commode (at bedside): Moderate assist (Pt 50 - 74%/lift or lower)  Function - Chair/bed transfer Chair/bed transfer activity did not occur: Safety/medical concerns Chair/bed transfer method: Ambulatory Chair/bed transfer assist level: Supervision or verbal cues Chair/bed transfer assistive device: Environmental consultant (rollator) Chair/bed transfer details: Verbal cues for precautions/safety, Verbal cues for safe use of DME/AE  Function - Locomotion: Wheelchair Will patient use wheelchair at discharge?: No Type: Manual Max wheelchair distance: 200 Assist Level: Supervision or verbal cues Assist Level: Supervision or verbal cues Wheel 150 feet activity did not occur: Safety/medical concerns Assist Level: Touching or steadying assistance (Pt > 75%) Turns around,maneuvers to table,bed, and toilet,negotiates 3% grade,maneuvers on rugs and over doorsills: No Function - Locomotion: Ambulation Assistive device: Other (comment) (rollator) Max distance: 150 Assist level: Supervision or verbal cues Assist level: Supervision or verbal cues Assist level: Supervision or verbal cues Walk 150 feet activity did not occur: Safety/medical concerns Assist level: Supervision or verbal cues Walk 10 feet on uneven surfaces activity did not occur: Safety/medical concerns Assist level: Touching or steadying assistance (Pt > 75%) (ramp (not uneven surface))  Function - Comprehension Comprehension: Auditory Comprehension assist level: Follows complex conversation/direction with extra time/assistive device  Function - Expression Expression: Verbal Expression assist level: Expresses complex ideas: With extra time/assistive device  Function - Social Interaction Social Interaction assist level: Interacts appropriately with others with medication or extra time (anti-anxiety, antidepressant).  Function - Problem  Solving Problem solving assist level: Solves complex problems: With extra time  Function - Memory Memory assist level: Recognizes or recalls 90% of the time/requires cueing < 10% of the time Patient normally able to recall (first 3 days only): Location of own room, Staff names and faces, Current season, That he or she is in a hospital   Medical Problem List and Plan: 1. 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 DVT Prophylaxis/Anticoagulation: Coumadin per pharmacy protocol and no bridging required. Monitor for any bleeding episodes, INR supratherapeutic  3. Pain Management: Oxycodone and flexeril as needed. Monitor with increased mobility 4. Lupus nephritis with end-stage renal disease. Continue hemodialysis as advised. Permanent access graft placement 01/04/2015.Nephro following, no further pain around graft, WBC normal 10/18 5. Neuropsych: This patient is capable of making decisions on her own behalf. 6. Skin/Wound Care: Routine skin care 7. Fluids/Electrolytes/Nutrition: Strict I&O with follow-up chemistries, variable but reduced intake 0-20%, remains poor despite diet change, certainly multi medical issues may influence, CT abd is neg for acute process, depression likely playing a role, trial megace 8. Endocarditis of mitral valve. Unable to rule out infectious endocarditis as patient is immunosuppressed and leukopenic. Blood cultures negative. Plan vancomycin until 01/22/2015. 9. Hypertension. Norvasc 5 mg daily, Imdur or 30 mg daily, Toprol-XL 25 mg daily at bedtime. Monitor with increased mobility, 136/75 10. Pseudomonas urinary tract infection. Ceftazadime completed 01/03/2015, repeat UA  Neg, Cx neg 11. Morbid obesity. Dietary follow-up 12. Sacral decubitus. Foam dressing change every 3 days as needed pressure relief, bed positioning, turn q2h, wound healing will be delayed with poor intake 13. Hyperlipidemia.  Lipitor 14.  Nausea improved, pt feels some of this was diet related 15 FUO- per VVS, not graft related, IM consulted, no obvious cause spiking through IV Zosyn in addition to vanc WBC reviewed and normal, ?drug fever, await BC,? endocarditis 16.  Depression mood- appreciate neuropsych eval, pt declines medical treatment at this time 6.  Tachycardic-check EKG, sounds  regular, IM on consult 18.  Loose stool C diff neg, no laxatives, may be antibiotic associated  LOS (Days) 11 A FACE TO FACE EVALUATION WAS PERFORMED  Namir Neto E 01/16/2015, 9:33 AM

## 2015-01-16 NOTE — Progress Notes (Addendum)
Internal Medicine Attending:   I saw and examined the patient. I reviewed the resident's note and I agree with the resident's findings and plan as documented in the resident's note.  Fever has now been resolved for 48 hours. We were not able to clearly identify the source of this fever. I think most likely it was an infection, and the most at risk source is the temporary femoral HD line. Cultures from that line are no growth so I don't think there is enough evidence to change that line right now. She has had 6 days of zosyn, which I think is adequate and we can discontinue this now. Nephrology will change HD access to the left femoral graft as soon as it is mature. Endocarditis seems to be well treated on vancomycin, repeat echocardiogram shows resolution of mitral vegetation. Continue coumadin indefinitely.  She had tachycardia this morning on vitals check. ECG is sinus tachycardia. She tells [place] she has had fast heart rates intermittently before. It could be related to deconditioning or fluid shifts since recently starting HD. BP is stable with no presyncope symptoms. Would follow up in clinic and would not increase beta blocker yet.

## 2015-01-16 NOTE — Progress Notes (Signed)
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 Food Swelling    Red peppers    Patient Measurements: Height: 5\' 3"  (160 cm) Weight: 187 lb 2.7 oz (84.9 kg) IBW/kg (Calculated) : 52.4   Vital Signs: Temp: 98.1 F (36.7 C) (10/19 0536) Temp Source: Oral (10/19 0536) BP: 124/78 mmHg (10/19 0536) Pulse Rate: 113 (10/19 0555)  Labs:  Recent Labs  01/14/15 0436 01/15/15 0815 01/15/15 1545 01/16/15 0530  HGB 8.9*  --  10.8*  --   HCT 28.0*  --  33.4*  --   PLT 356  --  411*  --   LABPROT 42.5* 37.4*  --  36.1*  INR 4.64* 3.91*  --  3.74*  CREATININE  --   --  7.96*  --     Estimated Creatinine Clearance: 9.9 mL/min (by C-G formula based on Cr of 7.96).   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 INR down to 3.74 today, Hgb 10.8. Guaiac stool Positive. No active bleeding noted. Poor po intake.   Goal of Therapy:  INR 2 - 3 (per Dr. Azucena Freed note on 9/28) Monitor platelets by anticoagulation protocol: Yes   Plan:  -Continue Hold coumadin today -Daily PT/INR  Maryanna Shape, PharmD, BCPS  Clinical Pharmacist  Pager: [phone]  01/16/2015 12:39 PM

## 2015-01-16 NOTE — Patient Care Conference (Signed)
Inpatient RehabilitationTeam Conference and Plan of Care Update Date: 01/16/2015   Time: 11;00 AM    Patient Name: [name]      Medical Record Number: [id number]  Date of Birth: Jul 31, 1975 Sex: Female         Room/Bed: 4M01C/4M01C-01 Payor Info: Payor: MEDICAID August / Plan: MEDICAID [place] ACCESS / Product Type: *No Product type* /    Admitting Diagnosis: RIGHT CVA  Admit Date/Time:  01/05/2015  4:40 PM Admission Comments: No comment available   Primary Diagnosis:  <principal problem not specified> Principal Problem: <principal problem not specified>  Patient Active Problem List   Diagnosis Date Noted  . Adjustment disorder with depressed mood   . Post-operative pain 01/10/2015  . Left hemiparesis ([place])   . 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 Anemia due to bone marrow failure ([place])   . 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 Other pancytopenia ([place])   . Pressure ulcer 12/21/2014  . 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 ESRD (end stage renal disease) on dialysis ([place])   . UTI (lower urinary tract infection)   . Retroperitoneal hematoma   . Lupus nephritis ([place])   . Hypertension 07/20/2012  . Weight loss, unintentional 07/20/2012    Expected Discharge Date: Expected Discharge Date: 01/17/15  Team Members Present: Physician leading conference: Dr. Alysia Penna Social Worker Present: Ovidio Kin, LCSW Nurse Present: Dorien Chihuahua, RN PT Present: Kem Parkinson, PT OT Present: Simonne Come, OT SLP Present: Windell Moulding, SLP PPS Coordinator present : Daiva Nakayama, RN, CRRN     Current Status/Progress Goal Weekly Team Focus  Medical   cognitive issues higher level, very slow, poor motivation  Home with family asssit  D/C planning   Bowel/Bladder   Continent of bowel. LBM 10/181/6. Oliguric HD-T, Th, Sat  Pt to remain continent of bowel  Continue to monitor   Swallow/Nutrition/ Hydration     na        ADL's   supervision/setup bathing and dressing with use of AE to assist with LB dressing, supervision transfers  Mod I overall, supervision shower transfer  endurance and activity tolerance, standing balance, functional mobility   Mobility   supervision transfers, gait 150' RW, stairs; all activities limited by endurance  mod I overall  activity tolerance, bed mobility and furniture transfers, LE strengthening   Communication     na        Safety/Cognition/ Behavioral Observations    no unsafe behaviors         Pain   No c/o pain  <3  Monitor for nonverbal cues of pain   Skin   L thigh with immature graft w/steristrips to area. Double Lumen Picc to R Chest. HD-graft to R thigh, dressing to area for reinforcement. Dressing CDI  No additional skin breakdown  Assess  skin q shift      *See Care Plan and progress notes for long and short-term goals.  Barriers to Discharge: learned helplessness    Possible Resolutions to Barriers:  D/C to home with family assist and home    Discharge Planning/Teaching Needs:  HOme with Mom and her children-need MOm to come in for therapies      Team Discussion:  Reached her goals of mod/i-supervision level.  According to MD medical team feels will be medically stable for discharge tomorrow. Fever-resolved, HR elevated-checking EKG. May need IV antibiotics at home. HD first run tomorrow.  Revisions to Treatment Plan:  Medical  issues may delay her discharge   Continued Need for Acute Rehabilitation Level of Care: The patient requires daily medical management by a physician with specialized training in physical medicine and rehabilitation for the following conditions: Daily direction of a multidisciplinary physical rehabilitation program to ensure safe treatment while eliciting the highest outcome that is of practical value to the patient.: Yes Daily medical management of patient stability for increased activity during participation in an intensive rehabilitation  regime.: Yes Daily analysis of laboratory values and/or radiology reports with any subsequent need for medication adjustment of medical intervention for : Neurological problems  Vieva Brummitt, Gardiner Rhyme 01/16/2015, 12:39 PM

## 2015-01-16 NOTE — Progress Notes (Signed)
Occupational Therapy Discharge Summary  Patient Details  Name: [name] MRN: [id number] Date of Birth: 09/30/1975  Patient has met 9 of 12 long term goals due to improved activity tolerance, improved balance, ability to compensate for deficits, functional use of  LEFT upper extremity and improved awareness.  Patient to discharge at overall Modified Independent level with ADLs, and recommend supervision with IADLs.  Patient's care partner is independent to provide the necessary cognitive assistance at discharge.    Reasons goals not met: Pt will require supervision with home making tasks secondary to impaired memory and problem solving.  Pt's mother lives with pt and is able to assist with all IADL tasks.  Recommendation:  Patient will benefit from ongoing skilled OT services in home health setting to continue to advance functional skills in the area of BADL, iADL and Reduce care partner burden.  Equipment: No equipment provided  Reasons for discharge: treatment goals met and discharge from hospital  Patient/family agrees with progress made and goals achieved: Yes  OT Discharge Precautions/Restrictions  Precautions Precautions: Fall Restrictions Weight Bearing Restrictions: No Pain Pain Assessment Pain Assessment: 0-10 Pain Score: 3  Faces Pain Scale: Hurts a little bit Pain Type: Surgical pain Pain Location: Leg Pain Orientation: Left Pain Descriptors / Indicators: Aching Pain Onset: On-going Patients Stated Pain Goal: 0 Pain Intervention(s): Ambulation/increased activity;Repositioned Multiple Pain Sites: No ADL  See Function Navigator Vision/Perception  Vision- History Baseline Vision/History: No visual deficits Patient Visual Report: No change from baseline Vision- Assessment Vision Assessment?: No apparent visual deficits Perception Comments: WNL  Cognition Overall Cognitive Status: Within Functional Limits for tasks assessed Arousal/Alertness:  Awake/alert Orientation Level: Oriented X4 Attention: Selective Selective Attention: Appears intact Memory: Impaired Memory Impairment: Decreased recall of new information;Decreased short term memory Awareness: Appears intact Problem Solving: Impaired Safety/Judgment: Appears intact Sensation Sensation Light Touch: Appears Intact Proprioception: Appears Intact (accurate 5/5 BLE hallux) Coordination Gross Motor Movements are Fluid and Coordinated: Yes Fine Motor Movements are Fluid and Coordinated: Yes Heel Shin Test: decreased excursion bilaterally due to strength impairments, otherwise WNL Motor  Motor Motor: Abnormal postural alignment and control Motor - Discharge Observations: kyphotic posture, rounded shoulders, decreased righting and protective responses Mobility  Bed Mobility Bed Mobility: Rolling Left;Left Sidelying to Sit;Supine to Sit Rolling Left: 6: Modified independent (Device/Increase time) Left Sidelying to Sit: 6: Modified independent (Device/Increase time) Supine to Sit: 6: Modified independent (Device/Increase time) Supine to Sit Details: Verbal cues for precautions/safety Sit to Supine: 5: Supervision Sit to Supine - Details: Verbal cues for technique Transfers Sit to Stand: 6: Modified independent (Device/Increase time) Sit to Stand Details (indicate cue type and reason): one hand on rollator, other on seated surface Stand to Sit: 6: Modified independent (Device/Increase time)  Extremity/Trunk Assessment RUE Assessment RUE Assessment: Within Functional Limits (strength grossly 4/5) LUE Assessment LUE Assessment: Exceptions to WFL LUE AROM (degrees) Overall AROM Left Upper Extremity: Deficits LUE Overall AROM Comments: grossly WFL except Shoulder to 120 degrees abd/flex LUE Strength LUE Overall Strength: Deficits LUE Overall Strength Comments: grossly 4-/5 except shoulder 3/5   See Function Navigator for Current Functional Status.  Simonne Come 01/16/2015, 3:19 PM

## 2015-01-17 ENCOUNTER — Inpatient Hospital Stay (HOSPITAL_COMMUNITY): Payer: Medicaid Other

## 2015-01-17 DIAGNOSIS — G8918 Other acute postprocedural pain: Secondary | ICD-10-CM

## 2015-01-17 DIAGNOSIS — F4321 Adjustment disorder with depressed mood: Secondary | ICD-10-CM

## 2015-01-17 DIAGNOSIS — Z992 Dependence on renal dialysis: Secondary | ICD-10-CM

## 2015-01-17 DIAGNOSIS — I058 Other rheumatic mitral valve diseases: Secondary | ICD-10-CM

## 2015-01-17 DIAGNOSIS — I63511 Cerebral infarction due to unspecified occlusion or stenosis of right middle cerebral artery: Secondary | ICD-10-CM

## 2015-01-17 DIAGNOSIS — N186 End stage renal disease: Secondary | ICD-10-CM

## 2015-01-17 LAB — CBC
HCT: 26.7 % — ABNORMAL LOW (ref 36.0–46.0)
HEMOGLOBIN: 8.6 g/dL — AB (ref 12.0–15.0)
MCH: 30.1 pg (ref 26.0–34.0)
MCHC: 32.2 g/dL (ref 30.0–36.0)
MCV: 93.4 fL (ref 78.0–100.0)
PLATELETS: 420 10*3/uL — AB (ref 150–400)
RBC: 2.86 MIL/uL — ABNORMAL LOW (ref 3.87–5.11)
RDW: 17.3 % — AB (ref 11.5–15.5)
WBC: 5.3 10*3/uL (ref 4.0–10.5)

## 2015-01-17 LAB — RENAL FUNCTION PANEL
ALBUMIN: 1.7 g/dL — AB (ref 3.5–5.0)
Anion gap: 12 (ref 5–15)
BUN: 15 mg/dL (ref 6–20)
CO2: 27 mmol/L (ref 22–32)
CREATININE: 6.95 mg/dL — AB (ref 0.44–1.00)
Calcium: 8 mg/dL — ABNORMAL LOW (ref 8.9–10.3)
Chloride: 98 mmol/L — ABNORMAL LOW (ref 101–111)
GFR, EST AFRICAN AMERICAN: 8 mL/min — AB (ref >60)
GFR, EST NON AFRICAN AMERICAN: 7 mL/min — AB (ref >60)
Glucose, Bld: 96 mg/dL (ref 65–99)
PHOSPHORUS: 4.9 mg/dL — AB (ref 2.5–4.6)
POTASSIUM: 4.7 mmol/L (ref 3.5–5.1)
Sodium: 137 mmol/L (ref 135–145)

## 2015-01-17 LAB — PROTIME-INR
INR: 3.57 — AB (ref 0.00–1.49)
PROTHROMBIN TIME: 34.9 s — AB (ref 11.6–15.2)

## 2015-01-17 MED ORDER — LIDOCAINE HCL 1 % IJ SOLN
INTRAMUSCULAR | Status: AC
  Filled 2015-01-17: qty 20

## 2015-01-17 MED ORDER — HYDROXYCHLOROQUINE SULFATE 200 MG PO TABS: 400.0000 mg | ORAL_TABLET | Freq: Every day | ORAL | Status: AC

## 2015-01-17 MED ORDER — DARBEPOETIN ALFA 200 MCG/0.4ML IJ SOSY
PREFILLED_SYRINGE | INTRAMUSCULAR | Status: AC
  Administered 2015-01-17: 200 ug via INTRAVENOUS
  Filled 2015-01-17: qty 0.4

## 2015-01-17 MED ORDER — PANTOPRAZOLE SODIUM 20 MG PO TBEC: 20.0000 mg | DELAYED_RELEASE_TABLET | Freq: Every day | ORAL | Status: AC

## 2015-01-17 MED ORDER — PENTAFLUOROPROP-TETRAFLUOROETH EX AERO: 1.0000 "application " | INHALATION_SPRAY | CUTANEOUS | Status: DC | PRN

## 2015-01-17 MED ORDER — LIDOCAINE HCL (PF) 1 % IJ SOLN: 5.0000 mL | INTRAMUSCULAR | Status: DC | PRN

## 2015-01-17 MED ORDER — RENA-VITE PO TABS: 1.0000 | ORAL_TABLET | Freq: Every day | ORAL | Status: DC

## 2015-01-17 MED ORDER — OXYCODONE-ACETAMINOPHEN 5-325 MG PO TABS: 1.0000 | ORAL_TABLET | Freq: Four times a day (QID) | ORAL | Status: DC | PRN

## 2015-01-17 MED ORDER — SODIUM CHLORIDE 0.9 % IV SOLN: 100.0000 mL | INTRAVENOUS | Status: DC | PRN

## 2015-01-17 MED ORDER — LIDOCAINE-PRILOCAINE 2.5-2.5 % EX CREA: 1.0000 "application " | TOPICAL_CREAM | CUTANEOUS | Status: DC | PRN

## 2015-01-17 MED ORDER — ALTEPLASE 2 MG IJ SOLR: 2.0000 mg | Freq: Once | INTRAMUSCULAR | Status: DC | PRN

## 2015-01-17 MED ORDER — HEPARIN SODIUM (PORCINE) 1000 UNIT/ML DIALYSIS: 1000.0000 [IU] | INTRAMUSCULAR | Status: DC | PRN

## 2015-01-17 MED ORDER — METOPROLOL SUCCINATE ER 25 MG PO TB24: 25.0000 mg | ORAL_TABLET | Freq: Every day | ORAL | Status: DC

## 2015-01-17 MED ORDER — CYCLOBENZAPRINE HCL 10 MG PO TABS: 10.0000 mg | ORAL_TABLET | Freq: Every evening | ORAL | Status: AC | PRN

## 2015-01-17 MED ORDER — CHLORHEXIDINE GLUCONATE 4 % EX LIQD
CUTANEOUS | Status: AC
  Filled 2015-01-17: qty 15

## 2015-01-17 MED ORDER — ATORVASTATIN CALCIUM 20 MG PO TABS
20.0000 mg | ORAL_TABLET | Freq: Every day | ORAL | Status: AC
Start: 2015-01-17 — End: ?

## 2015-01-17 MED ORDER — ISOSORBIDE MONONITRATE ER 30 MG PO TB24: 30.0000 mg | ORAL_TABLET | Freq: Every day | ORAL | Status: DC

## 2015-01-17 NOTE — Progress Notes (Deleted)
Subjective: [name] is a 39yo F with PMH SLE, HTN, SVT who we were consulted for to evaluation of fever of unknown origin. She had no fevers overnight, although she did have a Tmax 100.2, but no subjective chills. She has had no nausea and is receiving HD when we saw her this AM without any new complaints.  Objective: Vital signs in last 24 hours: Filed Vitals:   01/17/15 1018 01/17/15 1026 01/17/15 1047 01/17/15 1130  BP: 87/57 91/74 120/51 108/71  Pulse: 103 100  105  Temp:    97.3 F (36.3 C)  TempSrc:    Oral  Resp:    18  Height:      Weight:      SpO2:       Weight change: 3.5 oz (0.1 kg)  Intake/Output Summary (Last 24 hours) at 01/17/15 1146 Last data filed at 01/17/15 1130  Gross per 24 hour  Intake    120 ml  Output   2530 ml  Net  -2410 ml   BP 108/71 mmHg  Pulse 105  Temp(Src) 97.3 F (36.3 C) (Oral)  Resp 18  Ht 5\' 3"  (1.6 m)  Wt 195 lb 15.8 oz (88.9 kg)  BMI 34.73 kg/m2  SpO2 98%  LMP 11/27/2014  General Appearance:    Alert, cooperative, no distress, appears stated age   Heart:    Regular rate and rhythm, S1 and S2 normal, S4 heard best at left sternal border. No S3 heard. No murmurs or rubs heard.  Abdomen:     Soft, non-tender, bowel sounds active all four quadrants,    no masses, no organomegaly  Extremities:   Left leg and foot are apparently edematous and significantly larger in diameter than the right leg. Calf nontender to ankle dorsiflexion bilaterally. Intact left thigh permanent graph with good thrill and some bruising in the surrounding region. There is no bleeding, increased warmth, only mild tenderness, without any obvious signs of infection. Right thigh temporary cath c/d/i.   Pulses:   2+ and symmetric all extremities  Skin:   Skin color, texture, turgor normal, no rashes or lesions  Neurologic:   CNII-XII intact, normal strength, sensation and reflexes    throughout   Lab Results: Basic Metabolic Panel:  CBC:  Recent Labs Lab  01/15/15 1545 01/17/15 0540  WBC 7.9 5.3  HGB 10.8* 8.6*  HCT 33.4* 26.7*  MCV 93.8 93.4  PLT 411* 420*   Coagulation:  Recent Labs Lab 01/14/15 0436 01/15/15 0815 01/16/15 0530 01/17/15 0540  LABPROT 42.5* 37.4* 36.1* 34.9*  INR 4.64* 3.91* 3.74* 3.57*   Assessment/Plan: Active Problems:   ESRD (end stage renal disease) on dialysis (HCC)   Endocarditis of mitral valve   Right middle cerebral artery stroke (HCC)   Left hemiparesis (HCC)   Post-operative pain   Adjustment disorder with depressed mood  Indeterminate fever: Patient had oral temperatures of 101.9 on 10/13, 102.9 and 103.2 on 10/14, spiked again to 101.2 on 10/16. CXR on 10/14 showed mild left basilar subsegmental atelectasis, no pleural effusion or consolidation noted. UA negative for UTI and no urinary symptoms. On 10/14, was found to have candidal vulvovaginitis, with one dose of fluconazole given. Patient is on empiric zosyn as well as continuing vancomycin for endocarditis coverage. Her continued spiking fevers are possibly of infectious cause without obvious source, however lupus flares and drug-induced fever are possible considerations. She says her flare ups prior to hospitalization involved swelling in her hands and feet, but  she does not recall any associated fevers when asked. She was previously pancytopenic with WBCs b/w 1-2 at the beginning of this month. WBC normalized now at 5.7. She does have increased leg swelling today on her left side, without any other symptoms, which may just be venous occlusion from her graft but may also be from thrombosis, which may also explain her spiking fevers. Another likely infectious source is her HD line infection. ID was consulted and recommended CT abdomen/pelvis and TTE for further evaluation of indeterminate fever. CT A/P was negative for any acute changes and possible sources. Procalcitonin was elevated, suggesting bacterial infection. DVT of bilateral LE were negative,  making thrombus as a source of fever less likely. Echo on 10/18 shows resolution/no evidence of mitral valve vegetations. -Consult VIR about removing R PICC as no longer needed and is a source of infection. INR is 3.6 this morning, so can schedule outpatient follow-up for removal if unable to do before discharge -Continue anticoagulation with coumadin - recommendations for duration are indefinite for nonbacterial/marantic endocarditis -Continue vancomycin (last dose anticipated 10/25), completed course of Zosyn on 10/19 -Cultures show NGTD -If patient re-spikes fever, re-draw cultures from HD line as this is the most likely source of her fever at this time    Dispo: Per primary team    LOS: 12 days   Norval Gable, MD 01/17/2015, 11:46 AM

## 2015-01-17 NOTE — Procedures (Signed)
4 hour hemodialysis treatment completed without difficulty. Total of 2.5kg removed during treatment with one episode of hypotension relieved by temporarily turning off UF. Pt tolerated treatment well.  Clearnce Sorrel, RN, BSN 01/17/2015 12:17 PM

## 2015-01-17 NOTE — Discharge Summary (Signed)
[name], [name] NO.:  [id number]  MEDICAL RECORD NO.:  [id number]  LOCATION:  4M01C                        FACILITY:  [facility]  PHYSICIAN:  Charlett Blake, M.D.DATE OF BIRTH:  [birth year]  DATE OF ADMISSION:  01/07/2015 DATE OF DISCHARGE:  01/17/2015                              DISCHARGE SUMMARY   DISCHARGE DIAGNOSES: 1. Functional deficits secondary to right MCA infarct with history of     lupus. 2. Chronic Coumadin therapy. 3. Pain management. 4. Lupus nephritis with end-stage renal disease. 5. Endocarditis of mitral valve with vancomycin to be completed     January 22, 2015. 6. Hypertension. 7. Pseudomonas urinary tract infection, resolved. 8. Sacral decubitus. 9. Hyperlipidemia. 10.Depression. 11.Tachycardia.  HISTORY OF PRESENT ILLNESS:  This is a 39 year old right-handed female with history of fibromyalgia, SVT with ablation, chronic anemia, hypertension, SLE, recently diagnosed lupus nephritis.  Lives with her mother and 2 young children.  Independent prior to admission.  Presented on December 03, 2014, with dysuria, fever as well as nausea, placed on broad-spectrum antibiotics.  Creatinine elevated 8.65.  CT abdomen and pelvis showed a small chronic left retroperitoneal hematoma, chronic subcapsular hematoma.  No findings to suggest recurrent hemorrhage. Underwent hemodialysis, catheter placement per Radiology Services. Dialysis initiated.  Dialysis catheter required ligation of AV graft due to ischemic steal left arm December 19, 2014, per Vascular Surgery.  On December 21, 2014, the patient became verbally less responsive, facial droop, right eye deviation.  Cranial CT scan negative.  MRI showed right MCA infarct.  MRA of the head negative.  CT angio of the head showed no extracranial cerebrovascular disease.  Echocardiogram with ejection fraction of 59% grade 1 diastolic dysfunction.  TEE with systolic normal function.  There was a  mobile density of the lateral aspect of the posterior mitral valve leaflet consistent with vegetation, mitral valve endocarditis placed on vancomycin x3 weeks ending January 22, 2015, placed on Coumadin therapy.  Wound care nurse consulted December 21, 2014, for sacral ulcer with dressing changes as indicated.  Physical and occupational therapy ongoing.  The patient was admitted for comprehensive rehab program.  PAST MEDICAL HISTORY:  See discharge diagnoses.  SOCIAL HISTORY:  Lives with mother and 2 young children.  Independent prior to admission.  Functional status upon admission to rehab services was +2 physical assist ambulating 60 rolling walker, +2 physical assist sit to stand, mod to max assist for activities of daily living.  PHYSICAL EXAMINATION:  VITAL SIGNS:  Blood pressure 133/70, pulse 99, temperature 99, respirations 18. GENERAL:  This was an alert female, mild left facial droop. EYES:  Pupils were round and reactive to light. LUNGS:  Clear to auscultation without wheeze. ABDOMEN:  Soft, nontender.  Good bowel sounds.  CARDIAC:  Rate controlled without murmur. Sacral ulcer was dressed.  [place] COURSE:  The patient was admitted to inpatient rehab services with therapies initiated on a 3-hour daily basis consisting of physical therapy, occupational therapy, and rehabilitation nursing.  The following issues were addressed during the patient's rehabilitation stay.  Pertaining to [name]'s right MCA infarct remained stable.  She would follow up with Neurology Services. She remained on chronic Coumadin therapy  which was to be followed by Dr. Lin Landsman, and arrangements made for a home health nurse.  No bleeding episodes. Patient's Coumadin has been held since 01/12/2015 due to supratherapeutic INR  down to 3.57 01/17/2015. Plan would be to remain hold on Coumadin until next follow-up INR on 01/21/2015. Goal INR 2.00-3.0.  Oxycodone and Flexeril as  needed for pain with good results. She continued on dialysis as per Renal Services.  Latest creatinine of 7.96.  Blood pressures controlled and monitored.  She was completing a course of vancomycin January 22, 2015, for endocarditis of mitral valve. She remained afebrile.  Noted morbid obesity.  Dietary consult was obtained.  She had a sacral decubitus with foam dressings, change every 3 days as needed with routine turning.  She was followed by internal medicine for ongoing medical issues.  Bouts of tachycardia, monitored closely, felt to be related to her acute conditioning.  All blood cultures follow up remain no growth.  She will complete vancomycin January 22, 2015.  The patient received weekly collaborative interdisciplinary team conferences to discuss estimated length of stay, family teaching, any barriers to her discharge.  The patient discharged ambulatory level modified independent.  Ambulation upwards to 75 feet, navigating 3-inch stairs at supervision, strength and endurance continued to improve.  She was able to gather her belongings for bathing, dressing, and grooming.  Needing some encouragement at times to participate.  Full family teaching was completed and plan discharge to home.  DISCHARGE MEDICATIONS:  Lipitor 20 mg p.o. daily.  Dulcolax tablet 5 mg b.i.d.  Flexeril 10 mg p.o. daily.  Plaquenil 400 mg p.o. daily.  Imdur 30 mg p.o. daily.  Toprol-XL 25 mg p.o. at bedtime.  Multivitamin daily. Oxycodone 1 tablet every 6 hours as needed severe pain, dispense of 90 tablets.  Protonix 20 mg p.o. daily.  Vancomycin 1250 mg every Tuesday, Thursday, Saturday with dialysis to be completed January 22, 2015.  Special instructions. Coumadin remains on hold since 01/12/2015 due to supratherapeutic INR down to 3.57 01/17/2015. Plan was to continue to hold Coumadin until next INR check 01/21/2015 adjust accordingly with INR goal 2.00-3.00.   Coumadin with INR goal 2.0 to 3.0.  The  patient would follow up with Dr. Curt Jews, Vascular Surgery, call for appointment; Dr. Alysia Penna, appointment to be made; Dr. Erling Cruz, Renal Services; Dr. Lin Landsman, January 23, 2015.  Home health nurse arranged, check INR January 21, 2015, results to Dr. Lin Landsman, fax [phone] and phone [phone].  Continue dialysis as directed.  Foam dressing to bilateral buttocks, change every 3 days as needed.     Lauraine Rinne, P.A.   ______________________________ Charlett Blake, M.D.    DA/MEDQ  D:  01/17/2015  T:  01/17/2015  Job:  [id number]  cc:   Zadie Cleverly D. Ayesha Rumpf, M.D. Rosetta Posner, M.D.

## 2015-01-17 NOTE — Discharge Instructions (Signed)
Inpatient Rehab Discharge Instructions  [name] Discharge date and time: No discharge date for patient encounter.   Activities/Precautions/ Functional Status: Activity: activity as tolerated Diet: renal diet Wound Care: none needed Functional status:  ___ No restrictions     ___ Walk up steps independently ___ 24/7 supervision/assistance   ___ Walk up steps with assistance ___ Intermittent supervision/assistance  ___ Bathe/dress independently ___ Walk with walker     ___ Bathe/dress with assistance ___ Walk Independently    ___ Shower independently _x__ Walk with assistance    ___ Shower with assistance ___ No alcohol     ___ Return to work/school ________  Special Instructions: Continue dialysis as directed   Home health nurse check INR 01/21/2015 results to Dr. Jackson Latino fax number [phone] phone [phone]   Foam Dressing to bilateral buttocks change every 3 days as needed    COMMUNITY REFERRALS UPON DISCHARGE:    Home Health:   PT, RN, [place] [facility]   Date of last service:01/17/2015  Medical Equipment/Items Ordered:ROLLATOR [place]   [phone]   GENERAL COMMUNITY RESOURCES FOR PATIENT/FAMILY: Support Groups:[facility]- 3RD Wednesday [place] AT 6:00-7:30 PM [place]                                                                                                                                          [address] [place] [address] My questions have been answered and I understand these instructions. I will adhere to these goals and the provided educational materials after my discharge from the hospital.  Patient/Caregiver Signature _______________________________ Date __________  Clinician Signature _______________________________________ Date __________  Please bring this form and your medication list with  you to all your follow-up doctor's appointments.

## 2015-01-17 NOTE — Progress Notes (Signed)
Request for right IJ tunneled central catheter removal that is no longer needed. Successful removal of catheter without immediate complications, hemostasis achieved and sterile dressing applied.  Tsosie Billing PA-C Interventional Radiology  01/17/15  2:29 PM

## 2015-01-17 NOTE — Progress Notes (Signed)
Subjective: [name] is a 39yo F with PMH SLE, HTN, SVT who we were consulted for to evaluation of fever of unknown origin. She had no fevers overnight, although she did have a Tmax 100.2, but no subjective chills. She has had no nausea and is receiving HD when we saw her this AM without any new complaints.  Objective: Vital signs in last 24 hours: Filed Vitals:   01/17/15 1018 01/17/15 1026 01/17/15 1047 01/17/15 1130  BP: 87/57 91/74 120/51 108/71  Pulse: 103 100  105  Temp:    97.3 F (36.3 C)  TempSrc:    Oral  Resp:    18  Height:      Weight:      SpO2:       Weight change: 3.5 oz (0.1 kg)  Intake/Output Summary (Last 24 hours) at 01/17/15 1150 Last data filed at 01/17/15 1130  Gross per 24 hour  Intake    120 ml  Output   2530 ml  Net  -2410 ml   BP 108/71 mmHg  Pulse 105  Temp(Src) 97.3 F (36.3 C) (Oral)  Resp 18  Ht 5\' 3"  (1.6 m)  Wt 195 lb 15.8 oz (88.9 kg)  BMI 34.73 kg/m2  SpO2 98%  LMP 11/27/2014  General Appearance:    Alert, cooperative, no distress, appears stated age   Heart:    Regular rate and rhythm, S1 and S2 normal, S4 heard best at left sternal border. No S3 heard. No murmurs or rubs heard.  Abdomen:     Soft, non-tender, bowel sounds active all four quadrants,    no masses, no organomegaly  Extremities:   Left leg and foot are apparently edematous and significantly larger in diameter than the right leg. Calf nontender to ankle dorsiflexion bilaterally. Intact left thigh permanent graph with good thrill and some bruising in the surrounding region. There is no bleeding, increased warmth, only mild tenderness, without any obvious signs of infection. Right thigh temporary cath c/d/i.   Pulses:   2+ and symmetric all extremities  Skin:   Skin color, texture, turgor normal, no rashes or lesions  Neurologic:   CNII-XII intact, normal strength, sensation and reflexes    throughout   Lab Results: Basic Metabolic Panel:  CBC:  Recent Labs Lab  01/15/15 1545 01/17/15 0540  WBC 7.9 5.3  HGB 10.8* 8.6*  HCT 33.4* 26.7*  MCV 93.8 93.4  PLT 411* 420*   Coagulation:  Recent Labs Lab 01/14/15 0436 01/15/15 0815 01/16/15 0530 01/17/15 0540  LABPROT 42.5* 37.4* 36.1* 34.9*  INR 4.64* 3.91* 3.74* 3.57*   Assessment/Plan: Active Problems:   ESRD (end stage renal disease) on dialysis (HCC)   Endocarditis of mitral valve   Right middle cerebral artery stroke (HCC)   Left hemiparesis (HCC)   Post-operative pain   Adjustment disorder with depressed mood  Indeterminate fever: Patient had oral temperatures of 101.9 on 10/13, 102.9 and 103.2 on 10/14, spiked again to 101.2 on 10/16. CXR on 10/14 showed mild left basilar subsegmental atelectasis, no pleural effusion or consolidation noted. UA negative for UTI and no urinary symptoms. On 10/14, was found to have candidal vulvovaginitis, with one dose of fluconazole given. Patient is on empiric zosyn as well as continuing vancomycin for endocarditis coverage. Her continued spiking fevers are possibly of infectious cause without obvious source, however lupus flares and drug-induced fever are possible considerations. She says her flare ups prior to hospitalization involved swelling in her hands and feet, but  she does not recall any associated fevers when asked. She was previously pancytopenic with WBCs b/w 1-2 at the beginning of this month. WBC normalized now at 5.7. She does have increased leg swelling today on her left side, without any other symptoms, which may just be venous occlusion from her graft but may also be from thrombosis, which may also explain her spiking fevers. Another likely infectious source is her HD line infection. ID was consulted and recommended CT abdomen/pelvis and TTE for further evaluation of indeterminate fever. CT A/P was negative for any acute changes and possible sources. Procalcitonin was elevated, suggesting bacterial infection. DVT of bilateral LE were negative,  making thrombus as a source of fever less likely. Echo on 10/18 shows resolution/no evidence of mitral valve vegetations. -Consult VIR about removing R PICC as no longer needed and is a source of infection. INR is 3.6 this morning, so can schedule outpatient follow-up for removal if unable to do before discharge -Continue anticoagulation with coumadin - recommendations for duration are indefinite for nonbacterial/marantic endocarditis -Continue vancomycin (last dose anticipated 10/25), completed course of Zosyn on 10/19 -Cultures show NGTD -If patient re-spikes fever, re-draw cultures from HD line as this is the most likely source of her fever at this time    Dispo: Per primary team    LOS: 12 days   Norval Gable, MD 01/17/2015, 11:50 AM

## 2015-01-17 NOTE — Progress Notes (Signed)
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 Food Swelling    Red peppers    Patient Measurements: Height: 5\' 3"  (160 cm) Weight: 195 lb 15.8 oz (88.9 kg) IBW/kg (Calculated) : 52.4   Vital Signs: Temp: 98.3 F (36.8 C) (10/20 0700) Temp Source: Oral (10/20 0700) BP: 115/44 mmHg (10/20 0800) Pulse Rate: 100 (10/20 0800)  Labs:  Recent Labs  01/15/15 0815 01/15/15 1545 01/16/15 0530 01/17/15 0540 01/17/15 0740  HGB  --  10.8*  --  8.6*  --   HCT  --  33.4*  --  26.7*  --   PLT  --  411*  --  420*  --   LABPROT 37.4*  --  36.1* 34.9*  --   INR 3.91*  --  3.74* 3.57*  --   CREATININE  --  7.96*  --   --  6.95*    Estimated Creatinine Clearance: 11.6 mL/min (by C-G formula based on Cr of 6.95).   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 INR down to 3.57 today. Hgb 8.6- due for dose of Aranesp in HD. Guaiac stool Positive. No active bleeding noted. Poor po intake.   Goal of Therapy:  INR 2 - 3 (per Dr. Azucena Freed note on 9/28) Monitor platelets by anticoagulation protocol: Yes   Plan:  -Continue to hold Coumadin today -noted discharge summary is complete and pending MD co-signature. Plan is for Home Health to check INR on 01/21/2015. With the rate patient's INR has been trending down, she should not be subtherapeutic when INR is checked on that day- do not recommend any doses of Coumadin to be taken prior to INR check on 01/21/2015.  Laylamarie Meuser D. Veena Sturgess, PharmD, BCPS Clinical Pharmacist Pager: [phone] 01/17/2015 8:32 AM

## 2015-01-17 NOTE — Progress Notes (Signed)
Subjective/Complaints: Pt states she did not sleep well overnight due to noise outside of her room, but she is looking forward to going home today. No fevers overnight.  ROS- Denies CP, SOB, n/v/d  Objective: Vital Signs: Blood pressure 116/69, pulse 105, temperature 100.2 F (37.9 C), temperature source Oral, resp. rate 18, height 5' 3"  (1.6 m), weight 90.3 kg (199 lb 1.2 oz), last menstrual period 11/27/2014, SpO2 100 %. No results found. Results for orders placed or performed during the hospital encounter of 01/05/15 (from the past 72 hour(s))  Culture, blood (routine x 2)     Status: None (Preliminary result)   Collection Time: 01/14/15 11:34 AM  Result Value Ref Range   Specimen Description BLOOD RIGHT HAND    Special Requests BOTTLES DRAWN AEROBIC AND ANAEROBIC 5CC    Culture NO GROWTH 2 DAYS    Report Status PENDING   Sedimentation rate     Status: Abnormal   Collection Time: 01/14/15 11:50 AM  Result Value Ref Range   Sed Rate 26 (H) 0 - 22 mm/hr  C-reactive protein     Status: Abnormal   Collection Time: 01/14/15 11:50 AM  Result Value Ref Range   CRP 6.1 (H) <1.0 mg/dL  Procalcitonin - Baseline     Status: None   Collection Time: 01/14/15 11:50 AM  Result Value Ref Range   Procalcitonin 3.96 ng/mL    Comment:        Interpretation: PCT > 2 ng/mL: Systemic infection (sepsis) is likely, unless other causes are known. (NOTE)         ICU PCT Algorithm               Non ICU PCT Algorithm    ----------------------------     ------------------------------         PCT < 0.25 ng/mL                 PCT < 0.1 ng/mL     Stopping of antibiotics            Stopping of antibiotics       strongly encouraged.               strongly encouraged.    ----------------------------     ------------------------------       PCT level decrease by               PCT < 0.25 ng/mL       >= 80% from peak PCT       OR PCT 0.25 - 0.5 ng/mL          Stopping of antibiotics                      encouraged.     Stopping of antibiotics           encouraged.    ----------------------------     ------------------------------       PCT level decrease by              PCT >= 0.25 ng/mL       < 80% from peak PCT        AND PCT >= 0.5 ng/mL            Continuing antibiotics  encouraged.       Continuing antibiotics            encouraged.    ----------------------------     ------------------------------     PCT level increase compared          PCT > 0.5 ng/mL         with peak PCT AND          PCT >= 0.5 ng/mL             Escalation of antibiotics                                          strongly encouraged.      Escalation of antibiotics        strongly encouraged.   Culture, blood (routine x 2)     Status: None (Preliminary result)   Collection Time: 01/14/15 11:50 AM  Result Value Ref Range   Specimen Description BLOOD RIGHT FINGER    Special Requests BOTTLES DRAWN AEROBIC AND ANAEROBIC 5CC    Culture NO GROWTH 2 DAYS    Report Status PENDING   Pregnancy, urine     Status: None   Collection Time: 01/14/15  5:28 PM  Result Value Ref Range   Preg Test, Ur NEGATIVE NEGATIVE    Comment:        THE SENSITIVITY OF THIS METHODOLOGY IS >20 mIU/mL.   C difficile quick scan w PCR reflex     Status: None   Collection Time: 01/15/15  5:18 AM  Result Value Ref Range   C Diff antigen NEGATIVE NEGATIVE   C Diff toxin NEGATIVE NEGATIVE   C Diff interpretation Negative for toxigenic C. difficile   Protime-INR     Status: Abnormal   Collection Time: 01/15/15  8:15 AM  Result Value Ref Range   Prothrombin Time 37.4 (H) 11.6 - 15.2 seconds   INR 3.91 (H) 0.00 - 1.49  Vancomycin, random     Status: None   Collection Time: 01/15/15  3:00 PM  Result Value Ref Range   Vancomycin Rm 18 ug/mL    Comment:        Random Vancomycin therapeutic range is dependent on dosage and time of specimen collection. A peak range is  20.0-40.0 ug/mL A trough range is 5.0-15.0 ug/mL          Renal function panel     Status: Abnormal   Collection Time: 01/15/15  3:45 PM  Result Value Ref Range   Sodium 133 (L) 135 - 145 mmol/L   Potassium 4.1 3.5 - 5.1 mmol/L   Chloride 97 (L) 101 - 111 mmol/L   CO2 24 22 - 32 mmol/L   Glucose, Bld 79 65 - 99 mg/dL   BUN 22 (H) 6 - 20 mg/dL   Creatinine, Ser 7.96 (H) 0.44 - 1.00 mg/dL   Calcium 8.1 (L) 8.9 - 10.3 mg/dL   Phosphorus 3.4 2.5 - 4.6 mg/dL   Albumin 1.9 (L) 3.5 - 5.0 g/dL   GFR calc non Af Amer 6 (L) >60 mL/min   GFR calc Af Amer 7 (L) >60 mL/min    Comment: (NOTE) The eGFR has been calculated using the CKD EPI equation. This calculation has not been validated in all clinical situations. eGFR's persistently <60 mL/min signify possible Chronic Kidney Disease.    Anion gap 12 5 - 15  CBC     Status: Abnormal   Collection Time: 01/15/15  3:45 PM  Result Value Ref Range   WBC 7.9 4.0 - 10.5 K/uL   RBC 3.56 (L) 3.87 - 5.11 MIL/uL   Hemoglobin 10.8 (L) 12.0 - 15.0 g/dL   HCT 33.4 (L) 36.0 - 46.0 %   MCV 93.8 78.0 - 100.0 fL   MCH 30.3 26.0 - 34.0 pg   MCHC 32.3 30.0 - 36.0 g/dL   RDW 17.7 (H) 11.5 - 15.5 %   Platelets 411 (H) 150 - 400 K/uL  Protime-INR     Status: Abnormal   Collection Time: 01/16/15  5:30 AM  Result Value Ref Range   Prothrombin Time 36.1 (H) 11.6 - 15.2 seconds   INR 3.74 (H) 0.00 - 1.49  Protime-INR     Status: Abnormal   Collection Time: 01/17/15  5:40 AM  Result Value Ref Range   Prothrombin Time 34.9 (H) 11.6 - 15.2 seconds   INR 3.57 (H) 0.00 - 1.49  CBC     Status: Abnormal   Collection Time: 01/17/15  5:40 AM  Result Value Ref Range   WBC 5.3 4.0 - 10.5 K/uL    Comment: REPEATED TO VERIFY   RBC 2.86 (L) 3.87 - 5.11 MIL/uL   Hemoglobin 8.6 (L) 12.0 - 15.0 g/dL    Comment: REPEATED TO VERIFY   HCT 26.7 (L) 36.0 - 46.0 %   MCV 93.4 78.0 - 100.0 fL   MCH 30.1 26.0 - 34.0 pg   MCHC 32.2 30.0 - 36.0 g/dL   RDW 17.3 (H) 11.5 -  15.5 %   Platelets 420 (H) 150 - 400 K/uL    Comment: REPEATED TO VERIFY     HEENT: normal Cardio: RRR and no murmurs Resp: CTA B/L and unlabored GI: BS positive and non tender Extremity:  No Edema, bruising Left thigh around surgical site, tenderness lateral aspect of graft Skin:   Warm and Dry Neuro: Alert/Oriented, Flat and Abnormal Motor Anti-gravity strength throughout Musc/Skel:  Other no pain with AROM in UE or LEs. No tenderness Gen NAD. Vitals reviewed.    Assessment/Plan: 1. Functional deficits secondary to Right MCA which require 3+ hours per day of interdisciplinary therapy in a comprehensive inpatient rehab setting. Physiatrist is providing close team supervision and 24 hour management of active medical problems listed below. Physiatrist and rehab team continue to assess barriers to discharge/monitor patient progress toward functional and medical goals.  Team conference today please see physician documentation under team conference tab, met with team face-to-face to discuss problems,progress, and goals. Formulized individual treatment plan based on medical history, underlying problem and comorbidities. FIM: Function - Bathing Position: Wheelchair/chair at sink Body parts bathed by patient: Right arm, Left arm, Chest, Abdomen, Front perineal area, Buttocks, Right upper leg, Left upper leg, Right lower leg, Left lower leg, Back Body parts bathed by helper: Back Bathing not applicable: Back Assist Level: More than reasonable time (with use of long handled sponge)  Function- Upper Body Dressing/Undressing What is the patient wearing?: Pull over shirt/dress, Bra Bra - Perfomed by patient: Thread/unthread right bra strap, Thread/unthread left bra strap, Hook/unhook bra (pull down sports bra) Bra - Perfomed by helper: Hook/unhook bra (pull down sports bra) Pull over shirt/dress - Perfomed by patient: Thread/unthread right sleeve, Thread/unthread left sleeve, Put head through  opening, Pull shirt over trunk Assist Level: More than reasonable time Set up : To obtain clothing/put away Function - Lower Body Dressing/Undressing What is  the patient wearing?: Underwear, Non-skid slipper socks (skirt) Position:  (sit > stand from toilet) Underwear - Performed by patient: Thread/unthread right underwear leg, Thread/unthread left underwear leg, Pull underwear up/down (with use of reacher for underwear and skirt) Underwear - Performed by helper: Thread/unthread right underwear leg, Thread/unthread left underwear leg (patient needed assist to get both legs in skirt) Non-skid slipper socks- Performed by patient: Don/doff right sock, Don/doff left sock (with use of sock aid) Non-skid slipper socks- Performed by helper: Don/doff right sock, Don/doff left sock Socks - Performed by helper: Don/doff right sock, Don/doff left sock Shoes - Performed by helper: Don/doff right shoe, Don/doff left shoe, Fasten right, Fasten left Assist for footwear: Setup (with use of AE) Assist for lower body dressing: More than reasonable time  Function - Toileting Toileting activity did not occur: Safety/medical concerns Toileting steps completed by patient: Adjust clothing prior to toileting, Performs perineal hygiene, Adjust clothing after toileting Toileting steps completed by helper: Performs perineal hygiene Assist level: More than reasonable time  Function - Air cabin crew transfer activity did not occur: Safety/medical concerns Toilet transfer assistive device: Elevated toilet seat/BSC over toilet, Grab bar, Walker Assist level to toilet: No Help, no cues, assistive device, takes more than a reasonable amount of time Assist level from toilet: No Help, no cues, assistive device, takes more than a reasonable amount of time Assist level to bedside commode (at bedside): Moderate assist (Pt 50 - 74%/lift or lower) Assist level from bedside commode (at bedside): Moderate assist (Pt 50 -  74%/lift or lower)  Function - Chair/bed transfer Chair/bed transfer activity did not occur: Safety/medical concerns Chair/bed transfer method: Ambulatory Chair/bed transfer assist level: No Help, no cues, assistive device, takes more than a reasonable amount of time Chair/bed transfer assistive device: Other (rollator) Chair/bed transfer details: Verbal cues for precautions/safety, Verbal cues for safe use of DME/AE  Function - Locomotion: Wheelchair Will patient use wheelchair at discharge?: No Type: Manual Max wheelchair distance: 200 Assist Level: Supervision or verbal cues Assist Level: Supervision or verbal cues Wheel 150 feet activity did not occur: Safety/medical concerns Assist Level: Touching or steadying assistance (Pt > 75%) Turns around,maneuvers to table,bed, and toilet,negotiates 3% grade,maneuvers on rugs and over doorsills: No Function - Locomotion: Ambulation Assistive device: Other (comment) (rollator) Max distance: 202 Assist level: No help, No cues, assistive device, takes more than a reasonable amount of time Assist level: No help, No cues, assistive device, takes more than a reasonable amount of time Assist level: No help, No cues, assistive device, takes more than a reasonable amount of time Walk 150 feet activity did not occur: Safety/medical concerns Assist level: No help, No cues, assistive device, takes more than a reasonable amount of time Walk 10 feet on uneven surfaces activity did not occur: Safety/medical concerns Assist level: Touching or steadying assistance (Pt > 75%)  Function - Comprehension Comprehension: Auditory Comprehension assist level: Follows complex conversation/direction with extra time/assistive device  Function - Expression Expression: Verbal Expression assist level: Expresses complex ideas: With extra time/assistive device  Function - Social Interaction Social Interaction assist level: Interacts appropriately with others with  medication or extra time (anti-anxiety, antidepressant).  Function - Problem Solving Problem solving assist level: Solves basic 90% of the time/requires cueing < 10% of the time  Function - Memory Memory assist level: Recognizes or recalls 90% of the time/requires cueing < 10% of the time Patient normally able to recall (first 3 days only): Location of own room, Staff names and  faces, Current season, That he or she is in a hospital   Medical Problem List and Plan: 1. 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 DVT Prophylaxis/Anticoagulation: Coumadin per pharmacy protocol and no bridging required. Monitor for any bleeding episodes, INR supratherapeutic  3. Pain Management: Oxycodone and flexeril as needed. Monitor with increased mobility 4. Lupus nephritis with end-stage renal disease. Continue hemodialysis as advised. Permanent access graft placement 01/04/2015.Nephro following, no further pain around graft, WBC normal 10/18 5. Neuropsych: This patient is capable of making decisions on her own behalf. 6. Skin/Wound Care: Routine skin care 7. Fluids/Electrolytes/Nutrition: Strict I&O with follow-up chemistries, variable but reduced intake 0-20%, remains poor despite diet change, certainly multi medical issues may influence, CT abd is neg for acute process, depression likely playing a role, trial megace 8. Endocarditis of mitral valve. Unable to rule out infectious endocarditis as patient is immunosuppressed and leukopenic. Blood cultures negative. Plan vancomycin until 01/22/2015. 9. Hypertension. Norvasc 5 mg daily, Imdur or 30 mg daily, Toprol-XL 25 mg daily at bedtime. Monitored with increased mobility, 136/75 10. Pseudomonas urinary tract infection. Ceftazadime completed 01/03/2015, repeat UA  Neg, Cx neg 11. Morbid obesity. Dietary follow-up 12. Sacral decubitus. Foam dressing change every 3 days  as needed pressure relief, bed positioning, turn q2h, wound healing will be delayed with poor intake 13. Hyperlipidemia. Lipitor 14.  Nausea improved, pt feels some of this was diet related 15 FUO- per VVS, not graft related, IM consulted, no obvious cause spiking through IV Zosyn in addition to vanc WBC reviewed and normal, ?drug fever, await BC,? endocarditis 16.  Depression mood- appreciate neuropsych eval, pt declines medical treatment at this time 9.  Tachycardic-check EKG, sounds regular, IM on consult, believe likely deconditioning and follow up as outpt 18.  Loose stool C diff neg, no laxatives, may be antibiotic associated 19.  Anemia: will redraw labs today before discharge to assess drop in Hb in the context of elevated INR and discharge if stable.     LOS (Days) 12 A FACE TO FACE EVALUATION WAS PERFORMED  Ankit Lorie Phenix 01/17/2015, 7:04 AM

## 2015-01-17 NOTE — Progress Notes (Signed)
Social Work  Discharge Note  The overall goal for the admission was met for:   Discharge location: Yes-HOME WITH MOM AND HER CHILDREN  Length of Stay: Yes-12 DAYS  Discharge activity level: Yes-SUPERVISION/MOD/I LEVEL  Home/community participation: Yes  Services provided included: MD, RD, PT, OT, SLP, RN, CM, TR, Pharmacy, Neuropsych and SW  Financial Services: Medicaid  Follow-up services arranged: Home Health: [facility]-PT,OT,RN, DME: [place] and Patient/Family has no preference for HH/DME agencies  Comments (or additional information):PT REACHED MOD/I LEVEL GOALS [place]. [facility] APPLICATION FAXED IN WHILE HERE.  [facility] APPLICATION PENDING. LUPUS INFORMATION GIVEN TO PT.  Patient/Family verbalized understanding of follow-up arrangements: Yes  Individual responsible for coordination of the follow-up plan: SELF & DENISE-MOM  Confirmed correct DME delivered: [name] 01/17/2015    [name]

## 2015-01-17 NOTE — Progress Notes (Signed)
01/17/15 1538 nursing Patient discharged to home per wheelchair accompanied by NT and family. Discharge instructions done by PA. No further questions. Incisions dry and intact.

## 2015-01-17 NOTE — Procedures (Signed)
Tolerating HD via right leg PC.  LLE swollen s/p AV access.  Has C line right IJ which is planning to be removed. [name] C

## 2015-01-17 NOTE — Discharge Summary (Signed)
Discharge summary job [id number]

## 2015-01-19 LAB — CULTURE, BLOOD (ROUTINE X 2)
CULTURE: NO GROWTH
CULTURE: NO GROWTH
CULTURE: NO GROWTH

## 2015-01-25 ENCOUNTER — Inpatient Hospital Stay (HOSPITAL_COMMUNITY)
Admission: EM | Admit: 2015-01-25 | Discharge: 2015-02-04 | DRG: 314 | Disposition: A | Discharge status: Rehabilitation Facility | Payer: Medicaid Other | Attending: Infectious Diseases | Admitting: Infectious Diseases

## 2015-01-25 ENCOUNTER — Inpatient Hospital Stay (HOSPITAL_COMMUNITY): Payer: Medicaid Other

## 2015-01-25 ENCOUNTER — Emergency Department (HOSPITAL_COMMUNITY): Payer: Medicaid Other

## 2015-01-25 ENCOUNTER — Encounter (HOSPITAL_COMMUNITY): Payer: Self-pay

## 2015-01-25 DIAGNOSIS — A4152 Sepsis due to Pseudomonas: Secondary | ICD-10-CM | POA: Diagnosis present

## 2015-01-25 DIAGNOSIS — D638 Anemia in other chronic diseases classified elsewhere: Secondary | ICD-10-CM | POA: Diagnosis present

## 2015-01-25 DIAGNOSIS — Z79899 Other long term (current) drug therapy: Secondary | ICD-10-CM | POA: Diagnosis not present

## 2015-01-25 DIAGNOSIS — K219 Gastro-esophageal reflux disease without esophagitis: Secondary | ICD-10-CM | POA: Diagnosis present

## 2015-01-25 DIAGNOSIS — F320 Major depressive disorder, single episode, mild: Secondary | ICD-10-CM | POA: Diagnosis not present

## 2015-01-25 DIAGNOSIS — R509 Fever, unspecified: Secondary | ICD-10-CM

## 2015-01-25 DIAGNOSIS — R29898 Other symptoms and signs involving the musculoskeletal system: Secondary | ICD-10-CM

## 2015-01-25 DIAGNOSIS — Z7982 Long term (current) use of aspirin: Secondary | ICD-10-CM

## 2015-01-25 DIAGNOSIS — N186 End stage renal disease: Secondary | ICD-10-CM | POA: Diagnosis present

## 2015-01-25 DIAGNOSIS — I471 Supraventricular tachycardia: Secondary | ICD-10-CM | POA: Diagnosis present

## 2015-01-25 DIAGNOSIS — N390 Urinary tract infection, site not specified: Secondary | ICD-10-CM | POA: Diagnosis present

## 2015-01-25 DIAGNOSIS — A419 Sepsis, unspecified organism: Secondary | ICD-10-CM | POA: Diagnosis present

## 2015-01-25 DIAGNOSIS — I63411 Cerebral infarction due to embolism of right middle cerebral artery: Secondary | ICD-10-CM | POA: Diagnosis not present

## 2015-01-25 DIAGNOSIS — M3214 Glomerular disease in systemic lupus erythematosus: Secondary | ICD-10-CM | POA: Diagnosis present

## 2015-01-25 DIAGNOSIS — R5381 Other malaise: Secondary | ICD-10-CM | POA: Diagnosis present

## 2015-01-25 DIAGNOSIS — I63511 Cerebral infarction due to unspecified occlusion or stenosis of right middle cerebral artery: Secondary | ICD-10-CM | POA: Diagnosis not present

## 2015-01-25 DIAGNOSIS — R6521 Severe sepsis with septic shock: Secondary | ICD-10-CM | POA: Diagnosis not present

## 2015-01-25 DIAGNOSIS — I951 Orthostatic hypotension: Secondary | ICD-10-CM | POA: Diagnosis present

## 2015-01-25 DIAGNOSIS — M797 Fibromyalgia: Secondary | ICD-10-CM | POA: Diagnosis present

## 2015-01-25 DIAGNOSIS — E7800 Pure hypercholesterolemia, unspecified: Secondary | ICD-10-CM | POA: Diagnosis present

## 2015-01-25 DIAGNOSIS — I058 Other rheumatic mitral valve diseases: Secondary | ICD-10-CM | POA: Diagnosis present

## 2015-01-25 DIAGNOSIS — Z992 Dependence on renal dialysis: Secondary | ICD-10-CM | POA: Diagnosis not present

## 2015-01-25 DIAGNOSIS — G4733 Obstructive sleep apnea (adult) (pediatric): Secondary | ICD-10-CM | POA: Diagnosis present

## 2015-01-25 DIAGNOSIS — R7989 Other specified abnormal findings of blood chemistry: Secondary | ICD-10-CM

## 2015-01-25 DIAGNOSIS — E876 Hypokalemia: Secondary | ICD-10-CM

## 2015-01-25 DIAGNOSIS — Z9889 Other specified postprocedural states: Secondary | ICD-10-CM

## 2015-01-25 DIAGNOSIS — D696 Thrombocytopenia, unspecified: Secondary | ICD-10-CM | POA: Diagnosis not present

## 2015-01-25 DIAGNOSIS — I69354 Hemiplegia and hemiparesis following cerebral infarction affecting left non-dominant side: Secondary | ICD-10-CM

## 2015-01-25 DIAGNOSIS — I059 Rheumatic mitral valve disease, unspecified: Secondary | ICD-10-CM | POA: Diagnosis present

## 2015-01-25 DIAGNOSIS — R197 Diarrhea, unspecified: Secondary | ICD-10-CM | POA: Diagnosis not present

## 2015-01-25 DIAGNOSIS — Z6835 Body mass index (BMI) 35.0-35.9, adult: Secondary | ICD-10-CM

## 2015-01-25 DIAGNOSIS — A4150 Gram-negative sepsis, unspecified: Secondary | ICD-10-CM

## 2015-01-25 DIAGNOSIS — B965 Pseudomonas (aeruginosa) (mallei) (pseudomallei) as the cause of diseases classified elsewhere: Secondary | ICD-10-CM | POA: Diagnosis not present

## 2015-01-25 DIAGNOSIS — E669 Obesity, unspecified: Secondary | ICD-10-CM | POA: Diagnosis present

## 2015-01-25 DIAGNOSIS — G8194 Hemiplegia, unspecified affecting left nondominant side: Secondary | ICD-10-CM | POA: Diagnosis not present

## 2015-01-25 DIAGNOSIS — M329 Systemic lupus erythematosus, unspecified: Secondary | ICD-10-CM | POA: Diagnosis not present

## 2015-01-25 DIAGNOSIS — Z8673 Personal history of transient ischemic attack (TIA), and cerebral infarction without residual deficits: Secondary | ICD-10-CM

## 2015-01-25 DIAGNOSIS — T80211D Bloodstream infection due to central venous catheter, subsequent encounter: Secondary | ICD-10-CM | POA: Diagnosis not present

## 2015-01-25 DIAGNOSIS — I120 Hypertensive chronic kidney disease with stage 5 chronic kidney disease or end stage renal disease: Secondary | ICD-10-CM | POA: Diagnosis present

## 2015-01-25 DIAGNOSIS — I272 Other secondary pulmonary hypertension: Secondary | ICD-10-CM | POA: Diagnosis present

## 2015-01-25 DIAGNOSIS — E785 Hyperlipidemia, unspecified: Secondary | ICD-10-CM | POA: Diagnosis present

## 2015-01-25 DIAGNOSIS — I6930 Unspecified sequelae of cerebral infarction: Secondary | ICD-10-CM | POA: Diagnosis not present

## 2015-01-25 DIAGNOSIS — R091 Pleurisy: Secondary | ICD-10-CM | POA: Diagnosis not present

## 2015-01-25 DIAGNOSIS — T80211A Bloodstream infection due to central venous catheter, initial encounter: Principal | ICD-10-CM | POA: Diagnosis present

## 2015-01-25 DIAGNOSIS — R410 Disorientation, unspecified: Secondary | ICD-10-CM | POA: Diagnosis not present

## 2015-01-25 DIAGNOSIS — I288 Other diseases of pulmonary vessels: Secondary | ICD-10-CM | POA: Diagnosis not present

## 2015-01-25 DIAGNOSIS — M25562 Pain in left knee: Secondary | ICD-10-CM | POA: Diagnosis not present

## 2015-01-25 DIAGNOSIS — M3213 Lung involvement in systemic lupus erythematosus: Secondary | ICD-10-CM | POA: Diagnosis not present

## 2015-01-25 DIAGNOSIS — R7881 Bacteremia: Secondary | ICD-10-CM | POA: Diagnosis not present

## 2015-01-25 DIAGNOSIS — D6189 Other specified aplastic anemias and other bone marrow failure syndromes: Secondary | ICD-10-CM | POA: Diagnosis not present

## 2015-01-25 DIAGNOSIS — N2581 Secondary hyperparathyroidism of renal origin: Secondary | ICD-10-CM | POA: Diagnosis present

## 2015-01-25 HISTORY — DX: Cerebral infarction, unspecified: I63.9

## 2015-01-25 HISTORY — DX: Other specified postprocedural states: Z98.890

## 2015-01-25 LAB — BLOOD GAS, ARTERIAL
ACID-BASE EXCESS: 4.4 mmol/L — AB (ref 0.0–2.0)
BICARBONATE: 27.8 meq/L — AB (ref 20.0–24.0)
Drawn by: 246101
FIO2: 0.21
O2 SAT: 96 %
PATIENT TEMPERATURE: 99.7
TCO2: 28.9 mmol/L (ref 0–100)
pCO2 arterial: 38.4 mmHg (ref 35.0–45.0)
pH, Arterial: 7.476 — ABNORMAL HIGH (ref 7.350–7.450)
pO2, Arterial: 77.8 mmHg — ABNORMAL LOW (ref 80.0–100.0)

## 2015-01-25 LAB — CBC
HEMATOCRIT: 27 % — AB (ref 36.0–46.0)
HEMOGLOBIN: 8.9 g/dL — AB (ref 12.0–15.0)
MCH: 30.3 pg (ref 26.0–34.0)
MCHC: 33 g/dL (ref 30.0–36.0)
MCV: 91.8 fL (ref 78.0–100.0)
Platelets: 187 10*3/uL (ref 150–400)
RBC: 2.94 MIL/uL — AB (ref 3.87–5.11)
RDW: 18 % — AB (ref 11.5–15.5)
WBC: 16.2 10*3/uL — AB (ref 4.0–10.5)

## 2015-01-25 LAB — BASIC METABOLIC PANEL
ANION GAP: 17 — AB (ref 5–15)
Anion gap: 17 — ABNORMAL HIGH (ref 5–15)
BUN: 10 mg/dL (ref 6–20)
BUN: 15 mg/dL (ref 6–20)
CALCIUM: 7.3 mg/dL — AB (ref 8.9–10.3)
CHLORIDE: 92 mmol/L — AB (ref 101–111)
CO2: 28 mmol/L (ref 22–32)
CO2: 26 mmol/L (ref 22–32)
CREATININE: 5.77 mg/dL — AB (ref 0.44–1.00)
Calcium: 7.8 mg/dL — ABNORMAL LOW (ref 8.9–10.3)
Chloride: 95 mmol/L — ABNORMAL LOW (ref 101–111)
Creatinine, Ser: 5.32 mg/dL — ABNORMAL HIGH (ref 0.44–1.00)
GFR calc Af Amer: 11 mL/min — ABNORMAL LOW (ref >60)
GFR calc non Af Amer: 8 mL/min — ABNORMAL LOW (ref >60)
GFR, EST AFRICAN AMERICAN: 10 mL/min — AB (ref >60)
GFR, EST NON AFRICAN AMERICAN: 9 mL/min — AB (ref >60)
Glucose, Bld: 65 mg/dL (ref 65–99)
Glucose, Bld: 65 mg/dL (ref 65–99)
POTASSIUM: 2.6 mmol/L — AB (ref 3.5–5.1)
Potassium: 3.5 mmol/L (ref 3.5–5.1)
SODIUM: 137 mmol/L (ref 135–145)
SODIUM: 138 mmol/L (ref 135–145)

## 2015-01-25 LAB — RENAL FUNCTION PANEL
Albumin: 1.6 g/dL — ABNORMAL LOW (ref 3.5–5.0)
Anion gap: 12 (ref 5–15)
BUN: 14 mg/dL (ref 6–20)
CO2: 26 mmol/L (ref 22–32)
Calcium: 6.8 mg/dL — ABNORMAL LOW (ref 8.9–10.3)
Chloride: 98 mmol/L — ABNORMAL LOW (ref 101–111)
Creatinine, Ser: 5.54 mg/dL — ABNORMAL HIGH (ref 0.44–1.00)
GFR calc Af Amer: 10 mL/min — ABNORMAL LOW (ref >60)
GFR calc non Af Amer: 9 mL/min — ABNORMAL LOW (ref >60)
Glucose, Bld: 115 mg/dL — ABNORMAL HIGH (ref 65–99)
Phosphorus: 2 mg/dL — ABNORMAL LOW (ref 2.5–4.6)
Potassium: 2.8 mmol/L — ABNORMAL LOW (ref 3.5–5.1)
Sodium: 136 mmol/L (ref 135–145)

## 2015-01-25 LAB — DIFFERENTIAL
BASOS PCT: 0 %
Basophils Absolute: 0 10*3/uL (ref 0.0–0.1)
Eosinophils Absolute: 0 10*3/uL (ref 0.0–0.7)
Eosinophils Relative: 0 %
LYMPHS PCT: 2 %
Lymphs Abs: 0.3 10*3/uL — ABNORMAL LOW (ref 0.7–4.0)
MONOS PCT: 4 %
Monocytes Absolute: 0.6 10*3/uL (ref 0.1–1.0)
NEUTROS ABS: 15.3 10*3/uL — AB (ref 1.7–7.7)
Neutrophils Relative %: 94 %
WBC Morphology: INCREASED

## 2015-01-25 LAB — URINE MICROSCOPIC-ADD ON

## 2015-01-25 LAB — GLUCOSE, CAPILLARY
GLUCOSE-CAPILLARY: 54 mg/dL — AB (ref 65–99)
GLUCOSE-CAPILLARY: 101 mg/dL — AB (ref 65–99)
GLUCOSE-CAPILLARY: 84 mg/dL (ref 65–99)
Glucose-Capillary: 50 mg/dL — ABNORMAL LOW (ref 65–99)

## 2015-01-25 LAB — URINALYSIS, ROUTINE W REFLEX MICROSCOPIC
GLUCOSE, UA: NEGATIVE mg/dL
KETONES UR: 15 mg/dL — AB
Nitrite: POSITIVE — AB
Specific Gravity, Urine: 1.033 — ABNORMAL HIGH (ref 1.005–1.030)
Urobilinogen, UA: 0.2 mg/dL (ref 0.0–1.0)
pH: 6 (ref 5.0–8.0)

## 2015-01-25 LAB — LACTIC ACID, PLASMA
LACTIC ACID, VENOUS: 1 mmol/L (ref 0.5–2.0)
LACTIC ACID, VENOUS: 1.2 mmol/L (ref 0.5–2.0)
LACTIC ACID, VENOUS: 2.3 mmol/L — AB (ref 0.5–2.0)
Lactic Acid, Venous: 2.7 mmol/L (ref 0.5–2.0)

## 2015-01-25 LAB — I-STAT CG4 LACTIC ACID, ED: Lactic Acid, Venous: 3.04 mmol/L (ref 0.5–2.0)

## 2015-01-25 LAB — PROTIME-INR
INR: 1.54 — AB (ref 0.00–1.49)
Prothrombin Time: 18.5 seconds — ABNORMAL HIGH (ref 11.6–15.2)

## 2015-01-25 LAB — MRSA PCR SCREENING: MRSA by PCR: NEGATIVE

## 2015-01-25 LAB — TROPONIN I: TROPONIN I: 0.21 ng/mL — AB (ref <0.031)

## 2015-01-25 MED ORDER — CEFTAZIDIME 2 G IJ SOLR
2.0000 g | Freq: Once | INTRAMUSCULAR | Status: DC
  Administered 2015-01-25: 2 g via INTRAVENOUS
  Filled 2015-01-25: qty 2

## 2015-01-25 MED ORDER — ACETAMINOPHEN 650 MG RE SUPP: 650.0000 mg | Freq: Four times a day (QID) | RECTAL | Status: DC | PRN

## 2015-01-25 MED ORDER — SODIUM CHLORIDE 0.9 % IV BOLUS (SEPSIS)
500.0000 mL | Freq: Once | INTRAVENOUS | Status: AC
  Administered 2015-01-25: 500 mL via INTRAVENOUS

## 2015-01-25 MED ORDER — LIDOCAINE HCL (PF) 1 % IJ SOLN
5.0000 mL | INTRAMUSCULAR | Status: DC | PRN
  Filled 2015-01-25: qty 5

## 2015-01-25 MED ORDER — SODIUM CHLORIDE 0.9 % IV BOLUS (SEPSIS)
1000.0000 mL | Freq: Once | INTRAVENOUS | Status: AC
  Administered 2015-01-25: 1000 mL via INTRAVENOUS

## 2015-01-25 MED ORDER — SODIUM CHLORIDE 0.9 % IV SOLN: 100.0000 mL | INTRAVENOUS | Status: DC | PRN

## 2015-01-25 MED ORDER — SODIUM CHLORIDE 0.9 % IV SOLN
250.0000 mL | INTRAVENOUS | Status: DC | PRN
  Administered 2015-01-26: 250 mL via INTRAVENOUS

## 2015-01-25 MED ORDER — ACETAMINOPHEN 325 MG PO TABS
650.0000 mg | ORAL_TABLET | Freq: Four times a day (QID) | ORAL | Status: DC | PRN
  Administered 2015-01-25 – 2015-01-26 (×3): 650 mg via ORAL
  Filled 2015-01-25 (×2): qty 2

## 2015-01-25 MED ORDER — RENA-VITE PO TABS
1.0000 | ORAL_TABLET | Freq: Every day | ORAL | Status: DC
  Administered 2015-01-25 – 2015-02-03 (×10): 1 via ORAL
  Filled 2015-01-25 (×10): qty 1

## 2015-01-25 MED ORDER — PANTOPRAZOLE SODIUM 20 MG PO TBEC
20.0000 mg | DELAYED_RELEASE_TABLET | Freq: Every day | ORAL | Status: DC
Start: 2015-01-25 — End: 2015-01-28
  Administered 2015-01-25 – 2015-01-27 (×3): 20 mg via ORAL
  Filled 2015-01-25 (×3): qty 1

## 2015-01-25 MED ORDER — ISOSORBIDE MONONITRATE ER 30 MG PO TB24
30.0000 mg | ORAL_TABLET | Freq: Every day | ORAL | Status: DC
  Administered 2015-01-25 – 2015-01-27 (×3): 30 mg via ORAL
  Filled 2015-01-25 (×3): qty 1

## 2015-01-25 MED ORDER — OXYCODONE-ACETAMINOPHEN 5-325 MG PO TABS
1.0000 | ORAL_TABLET | Freq: Four times a day (QID) | ORAL | Status: DC | PRN
  Administered 2015-02-01 (×2): 1 via ORAL
  Filled 2015-01-25 (×4): qty 1

## 2015-01-25 MED ORDER — HEPARIN (PORCINE) IN NACL 100-0.45 UNIT/ML-% IJ SOLN
1100.0000 [IU]/h | INTRAMUSCULAR | Status: DC
  Administered 2015-01-25: 900 [IU]/h via INTRAVENOUS
  Filled 2015-01-25: qty 250

## 2015-01-25 MED ORDER — ALTEPLASE 2 MG IJ SOLR
2.0000 mg | Freq: Once | INTRAMUSCULAR | Status: DC | PRN
  Filled 2015-01-25: qty 2

## 2015-01-25 MED ORDER — ATORVASTATIN CALCIUM 20 MG PO TABS
20.0000 mg | ORAL_TABLET | Freq: Every day | ORAL | Status: DC
  Administered 2015-01-25 – 2015-02-04 (×10): 20 mg via ORAL
  Filled 2015-01-25 (×9): qty 1

## 2015-01-25 MED ORDER — POTASSIUM CHLORIDE CRYS ER 20 MEQ PO TBCR
40.0000 meq | EXTENDED_RELEASE_TABLET | Freq: Once | ORAL | Status: AC
  Administered 2015-01-25: 40 meq via ORAL
  Filled 2015-01-25: qty 2

## 2015-01-25 MED ORDER — POTASSIUM CHLORIDE 10 MEQ/100ML IV SOLN
10.0000 meq | Freq: Once | INTRAVENOUS | Status: AC
  Administered 2015-01-25: 10 meq via INTRAVENOUS
  Filled 2015-01-25: qty 100

## 2015-01-25 MED ORDER — SODIUM CHLORIDE 0.9 % IJ SOLN
3.0000 mL | Freq: Two times a day (BID) | INTRAMUSCULAR | Status: DC
  Administered 2015-01-25 – 2015-02-04 (×18): 3 mL via INTRAVENOUS

## 2015-01-25 MED ORDER — WARFARIN 0.5 MG HALF TABLET
0.5000 mg | ORAL_TABLET | Freq: Once | ORAL | Status: AC
  Administered 2015-01-25: 0.5 mg via ORAL
  Filled 2015-01-25 (×2): qty 1

## 2015-01-25 MED ORDER — PENTAFLUOROPROP-TETRAFLUOROETH EX AERO: 1.0000 "application " | INHALATION_SPRAY | CUTANEOUS | Status: DC | PRN

## 2015-01-25 MED ORDER — PIPERACILLIN-TAZOBACTAM IN DEX 2-0.25 GM/50ML IV SOLN
2.2500 g | Freq: Three times a day (TID) | INTRAVENOUS | Status: DC
  Administered 2015-01-25 (×2): 2.25 g via INTRAVENOUS
  Filled 2015-01-25 (×3): qty 50

## 2015-01-25 MED ORDER — VANCOMYCIN HCL IN DEXTROSE 1-5 GM/200ML-% IV SOLN
1000.0000 mg | INTRAVENOUS | Status: DC
  Filled 2015-01-25: qty 200

## 2015-01-25 MED ORDER — HEPARIN SODIUM (PORCINE) 1000 UNIT/ML DIALYSIS: 1000.0000 [IU] | INTRAMUSCULAR | Status: DC | PRN

## 2015-01-25 MED ORDER — HEPARIN SODIUM (PORCINE) 1000 UNIT/ML DIALYSIS: 20.0000 [IU]/kg | INTRAMUSCULAR | Status: DC | PRN

## 2015-01-25 MED ORDER — ONDANSETRON HCL 4 MG PO TABS: 4.0000 mg | ORAL_TABLET | Freq: Four times a day (QID) | ORAL | Status: DC | PRN

## 2015-01-25 MED ORDER — WARFARIN - PHARMACIST DOSING INPATIENT
Freq: Every day | Status: DC
  Administered 2015-01-25: 18:00:00

## 2015-01-25 MED ORDER — LIDOCAINE-PRILOCAINE 2.5-2.5 % EX CREA
1.0000 "application " | TOPICAL_CREAM | CUTANEOUS | Status: DC | PRN
  Filled 2015-01-25: qty 5

## 2015-01-25 MED ORDER — ONDANSETRON HCL 4 MG/2ML IJ SOLN
4.0000 mg | Freq: Four times a day (QID) | INTRAMUSCULAR | Status: DC | PRN
  Administered 2015-01-25 – 2015-01-29 (×6): 4 mg via INTRAVENOUS
  Filled 2015-01-25 (×4): qty 2

## 2015-01-25 MED ORDER — SODIUM CHLORIDE 0.9 % IJ SOLN: 3.0000 mL | INTRAMUSCULAR | Status: DC | PRN

## 2015-01-25 MED ORDER — PIPERACILLIN-TAZOBACTAM 3.375 G IVPB 30 MIN
3.3750 g | Freq: Once | INTRAVENOUS | Status: DC
  Filled 2015-01-25: qty 50

## 2015-01-25 MED ORDER — METOPROLOL SUCCINATE ER 25 MG PO TB24
25.0000 mg | ORAL_TABLET | Freq: Every day | ORAL | Status: DC
  Administered 2015-01-26 – 2015-02-03 (×9): 25 mg via ORAL
  Filled 2015-01-25 (×9): qty 1

## 2015-01-25 MED ORDER — DEXTROSE 5 % AND 0.45 % NACL IV BOLUS
1000.0000 mL | Freq: Once | INTRAVENOUS | Status: AC
  Administered 2015-01-25: 1000 mL via INTRAVENOUS

## 2015-01-25 MED ORDER — SODIUM CHLORIDE 0.9 % IJ SOLN
3.0000 mL | Freq: Two times a day (BID) | INTRAMUSCULAR | Status: DC
  Administered 2015-01-25 – 2015-01-27 (×4): 3 mL via INTRAVENOUS

## 2015-01-25 MED ORDER — POLYETHYLENE GLYCOL 3350 17 G PO PACK: 17.0000 g | PACK | Freq: Every day | ORAL | Status: DC | PRN

## 2015-01-25 MED ORDER — ACETAMINOPHEN 325 MG PO TABS
650.0000 mg | ORAL_TABLET | Freq: Once | ORAL | Status: AC | PRN
  Administered 2015-01-25: 650 mg via ORAL
  Filled 2015-01-25: qty 2

## 2015-01-25 MED ORDER — HYDROXYCHLOROQUINE SULFATE 200 MG PO TABS
400.0000 mg | ORAL_TABLET | Freq: Every day | ORAL | Status: DC
  Administered 2015-01-25 – 2015-02-04 (×10): 400 mg via ORAL
  Filled 2015-01-25 (×11): qty 2

## 2015-01-25 NOTE — ED Notes (Signed)
MD at bedside. 

## 2015-01-25 NOTE — ED Notes (Signed)
Md notified of critical K+ level

## 2015-01-25 NOTE — Consult Note (Signed)
[facility] Renal Consultation Note    Indication for Consultation:  Management of ESRD/hemodialysis; anemia, hypertension/volume and secondary hyperparathyroidism PCP: Lin Landsman  HPI: [name] is a 39 y.o. 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   When she was at dialysis Tuesday, she was noted to have a temp of 100 while on her last dose on Vancomycin (for prior endocarditis).  Heart rate was also noted to be high during her last two treatments --as high as 148 post HD 10/27.  Thursday 10/27 she presented below her EDW at 87.1 (prior EDW 88) and was kept even. Her EDW was lowered to 87.5. Pre HD BP Thursday was124/78 P 118 and post was 99/78. Pre and post HD temps on Thursday were 97.9 and 98.2 respectively.  Today she is too drowsy to obtain a history and review of systems.  Per ED note and admission H & P, the patient's mother reported the patient has had spiking temps as high as 103 with night sweats left leg swelling and graft pain.  She had a fall at home and cannot get around without assistance. She stays in bed all day, has occasional loose stool. She suspended her HD treatment early yesterday because she didn't feel well.  She has not been taking coumadin since d/c.  EKGs x 2 since admission show sinus tach WBC was 16.2 with 94% N and ^ bands K was 2.6 post HD  During her last admission, as above, was treated with vancomycin for mitral valve endocarditis - possible Rudene Christians but infectious etiology could not be ruled out.  She never had + blood cultures but had rec'd ATB's for a procedure 5 days prior to recognition of this so continuation of empiric therapy was recommended.  In addition during that admission in September she was also treated for a  pseudomonas UTI with ceftazidime (5 doses only for a simple cystitis)   She has a right tunnelled groin catheter that was placed 12/07/2014 and a left thigh AVG that was placed 01/04/15   Past Medical History  Diagnosis Date  . Hypertension   . Cardiac arrhythmia   . SVT (supraventricular tachycardia) ([place])     "today, last week, 2 wk ago; maybe 1 month ago, etc; started w/in last 3-4 yrs"(07/20/2012)  . Fainting     "~ 1 month ago; probably related to SVT" (07/20/2012)  . Shortness of breath     "related to SVT episodes" (07/20/2012)  . Chest pain at rest     "related to SVT" (07/20/2012)  . Lupus (systemic lupus erythematosus) ([place])   . GERD (gastroesophageal reflux disease)   . Fibromyalgia   . Irritable bowel syndrome (IBS)   . Hypercholesterolemia   . Heart murmur     "small" (12/25/2014)  . TIA (transient ischemic attack) 12/21/2014  . Sleep apnea     "don't wear mask anymore" (12/25/2014)  . Anemia   . History of blood transfusion "several"    "related to low counts"  . Daily headache   . Arthritis     "hips" (12/25/2014)  . Anxiety     "sometimes; don't take anything for it" (12/25/2014)  . ESRD (end stage renal disease) on dialysis ([place]) since 12/07/2014    "I've been doing it here on [facility]" (  12/25/2014)  . Stroke [facility])    Past Surgical History  Procedure Laterality Date  . Cesarean section  2001; 2010  . Cervical biopsy  2013  . Supraventricular tachycardia ablation  07/21/12     Dr. Cristopher Peru   . Peripheral vascular catheterization N/A 12/14/2014    Procedure: Upper Extremity Venography;  Surgeon: Elam Dutch, MD;  Location: [place] [facility];  Service: Cardiovascular;  Laterality: N/A;  . Insertion hybrid anteriovenous gortex graft Left 12/18/2014    Procedure: INSERTION GORE HYBRID ARTERIOVENOUS GRAFT LEFT AXILLO-BRACHIAL.;  Surgeon: Serafina Mitchell, MD;  Location: [facility] OR;  Service: Vascular;  Laterality: Left;  . Ligation of arteriovenous  fistula Left 12/19/2014     Procedure: LIGATION OF FISTULA;  Surgeon: Elam Dutch, MD;  Location: [place];  Service: Vascular;  Laterality: Left;  . Appendectomy  2011  . Tubal ligation  2010  . Tee without cardioversion N/A 12/25/2014    Procedure: TRANSESOPHAGEAL ECHOCARDIOGRAM (TEE);  Surgeon: Sueanne Margarita, MD;  Location: [place];  Service: Cardiovascular;  Laterality: N/A;  . Av fistula placement Left 01/04/2015    Procedure: INSERTION OF ARTERIOVENOUS (AV) GORE-TEX GRAFT THIGH;  Surgeon: Rosetta Posner, MD;  Location: [facility] OR;  Service: Vascular;  Laterality: Left;   Family History  Problem Relation Age of Onset  . Cancer Mother     breast and ovarian  . BRCA 1/2 Sister    Social History:  reports that she has never smoked. She has never used smokeless tobacco. She reports that she does not drink alcohol or use illicit drugs. Allergies  Allergen Reactions  . Food Swelling    Red peppers   Prior to Admission medications   Medication Sig Start Date End Date Taking? Authorizing Provider  atorvastatin (LIPITOR) 20 MG tablet Take 1 tablet (20 mg total) by mouth daily at 6 PM. 01/17/15  Yes Lavon Paganini Angiulli, PA-C  Calcium Carbonate Antacid (TUMS PO) Take 1-2 tablets by mouth daily as needed (heartburn).   Yes Historical Provider, MD  cyclobenzaprine (FLEXERIL) 10 MG tablet Take 1 tablet (10 mg total) by mouth at bedtime as needed for muscle spasms. 01/17/15  Yes Daniel J Angiulli, PA-C  hydroxychloroquine (PLAQUENIL) 200 MG tablet Take 2 tablets (400 mg total) by mouth daily. 01/17/15  Yes Daniel J Angiulli, PA-C  isosorbide mononitrate (IMDUR) 30 MG 24 hr tablet Take 1 tablet (30 mg total) by mouth daily. 01/17/15  Yes Daniel J Angiulli, PA-C  metoprolol succinate (TOPROL-XL) 25 MG 24 hr tablet Take 1 tablet (25 mg total) by mouth at bedtime. 01/17/15  Yes Daniel J Angiulli, PA-C  multivitamin (RENA-VIT) TABS tablet Take 1 tablet by mouth at bedtime. 01/17/15  Yes Daniel J Angiulli, PA-C   oxyCODONE-acetaminophen (PERCOCET) 5-325 MG tablet Take 1 tablet by mouth every 6 (six) hours as needed for severe pain. 01/17/15  Yes Daniel J Angiulli, PA-C  pantoprazole (PROTONIX) 20 MG tablet Take 1 tablet (20 mg total) by mouth daily. 01/17/15  Yes Daniel J Angiulli, PA-C  vancomycin (VANCOCIN) 1 GM/200ML SOLN Inject 200 mLs (1,000 mg total) into the vein Every Tuesday,Thursday,and Saturday with dialysis. 01/05/15  Yes Zada Finders, MD   Current Facility-Administered Medications  Medication Dose Route Frequency Provider Last Rate Last Dose  . 0.9 %  sodium chloride infusion  250 mL Intravenous PRN Milagros Loll, MD      . acetaminophen (TYLENOL) tablet 650 mg  650 mg Oral Q6H PRN Milagros Loll, MD  Or  . acetaminophen (TYLENOL) suppository 650 mg  650 mg Rectal Q6H PRN Milagros Loll, MD      . atorvastatin (LIPITOR) tablet 20 mg  20 mg Oral q1800 Milagros Loll, MD      . hydroxychloroquine (PLAQUENIL) tablet 400 mg  400 mg Oral Daily Milagros Loll, MD   400 mg at 01/25/15 0844  . isosorbide mononitrate (IMDUR) 24 hr tablet 30 mg  30 mg Oral Daily Milagros Loll, MD   30 mg at 01/25/15 0844  . metoprolol succinate (TOPROL-XL) 24 hr tablet 25 mg  25 mg Oral QHS Milagros Loll, MD      . multivitamin (RENA-VIT) tablet 1 tablet  1 tablet Oral QHS Milagros Loll, MD      . ondansetron [facility]) tablet 4 mg  4 mg Oral Q6H PRN Milagros Loll, MD       Or  . ondansetron [facility]) injection 4 mg  4 mg Intravenous Q6H PRN Milagros Loll, MD      . oxyCODONE-acetaminophen (PERCOCET/ROXICET) 5-325 MG per tablet 1 tablet  1 tablet Oral Q6H PRN Milagros Loll, MD      . pantoprazole (PROTONIX) EC tablet 20 mg  20 mg Oral Daily Milagros Loll, MD   20 mg at 01/25/15 0844  . piperacillin-tazobactam (ZOSYN) IVPB 2.25 g  2.25 g Intravenous Q8H Axel Filler, MD      . polyethylene glycol (MIRALAX / GLYCOLAX) packet 17 g  17 g Oral Daily PRN Milagros Loll, MD       . sodium chloride 0.9 % injection 3 mL  3 mL Intravenous Q12H Milagros Loll, MD   3 mL at 01/25/15 1000  . sodium chloride 0.9 % injection 3 mL  3 mL Intravenous Q12H Milagros Loll, MD   3 mL at 01/25/15 1000  . sodium chloride 0.9 % injection 3 mL  3 mL Intravenous PRN Milagros Loll, MD      . Derrill Memo ON 01/26/2015] vancomycin (VANCOCIN) IVPB 1000 mg/200 mL premix  1,000 mg Intravenous Q T,Th,Sa-HD Axel Filler, MD      . warfarin (COUMADIN) tablet 0.5 mg  0.5 mg Oral ONCE-1800 Erenest Blank, RPH      . Warfarin - Pharmacist Dosing Inpatient   Does not apply [place], [facility]       Labs: Lab Results  Component Value Date   INR 1.54* 01/25/2015   INR 3.57* 01/17/2015   INR 3.74* 01/21/8526    Basic Metabolic Panel:  Recent Labs Lab 01/25/15 0405  NA 137  K 2.6*  CL 92*  CO2 28  GLUCOSE 65  BUN 10  CREATININE 5.32*  CALCIUM 7.8*  CBC:  Recent Labs Lab 01/25/15 0405  WBC 16.2*  NEUTROABS 15.3*  HGB 8.9*  HCT 27.0*  MCV 91.8  PLT 187   Studies/Results: Ct Head Wo Contrast  01/25/2015  CLINICAL DATA:  39 year old female with new onset left-sided weakness. Code stroke. EXAM: CT HEAD WITHOUT CONTRAST TECHNIQUE: Contiguous axial images were obtained from the base of the skull through the vertex without intravenous contrast. COMPARISON:  12/21/2014 CT and MR FINDINGS: Right MCA/frontal infarct identified with similar distribution and size when compared to 12/21/2014. Calcification/clot in adjacent right MCA branch again noted. No new infarct, hemorrhage, extra-axial fluid collection, midline shift or hydrocephalus identified. No acute or suspicious bony abnormalities are identified. IMPRESSION: Evolutionary changes of subacute right MCA/frontal infarct. No definite  evidence of extension but consider MR as clinically indicated. No evidence of new hemorrhage or infarct. Critical Value/emergent results were called by telephone at the time of  interpretation on 01/25/2015 at 11:16 am to Dr. Doy Mince, who verbally acknowledged these results. Electronically Signed   By: Margarette Canada M.D.   On: 01/25/2015 11:18   Dg Chest Port 1 View  01/25/2015  CLINICAL DATA:  Acute onset of fever.  Initial encounter. EXAM: PORTABLE CHEST 1 VIEW COMPARISON:  Chest radiograph performed 01/11/2015 FINDINGS: The lungs are hypoexpanded. A small left pleural effusion is noted, with left basilar airspace opacity, which may reflect mild pneumonia. There is no evidence of pneumothorax. The cardiomediastinal silhouette is borderline enlarged. No acute osseous abnormalities are seen. IMPRESSION: Lungs hypoexpanded. Small left pleural effusion, with left basilar airspace opacity, which may reflect mild pneumonia. Borderline cardiomegaly. Electronically Signed   By: Garald Balding M.D.   On: 01/25/2015 04:16    ROS:  Unable to obtain due to excessive sleepiness; falls asleep with multiple attempts at questioning  Physical Exam: Filed Vitals:   01/25/15 0600 01/25/15 0601 01/25/15 0630 01/25/15 0709  BP: 119/54 119/54 103/48 100/50  Pulse: 135 133 122 115  Temp:  98.7 F (37.1 C)  99.7 F (37.6 C)  TempSrc:  Oral  Oral  Resp: 19 30 30 14   Height:    5' 3"  (1.6 m)  Weight:    89.7 kg (197 lb 12 oz)  SpO2: 99% 97% 97% 99%     General:  [name] supine sleeping and very drowsy when wakened Head: Normocephalic, atraumatic, sclera non-icteric, mucus membranes are moist Neck:  Thick short Lungs: Clear anteriorly will not stay awake long enough to follow commands. Breathing is unlabored. Heart: RRR 2/6 murmur no rub Abdomen:  Obese Soft, non-tender, non-distended + bowel sounds. No rebound/guarding.  Lower extremities: RLE without edema LLE + +edema (graft leg) (had LE venous dopplers 10/17 for leg swelling) Neuro: very sleepy, unable to keep awake for questioned Psych:  Unable to assess; wondering if some of disengagement is due to depression Dialysis Access:  right thigh TDC (12/07/14) - exit site no draining on dressing, and left thigh AVGG (01/04/15) + bruit/thrill- groin looks ok  Dialysis Orders:  [facility]  TTS 4 hr 400/800 EDW 87.5 2 K 2 Ca no profile right thigh TDC and left thigh graft (placed 10/7)  Aranesp 200/week heparin 2600 no labs no Fe or VDRA NO labs - hx prior steal of left arm graft s/p ligation 12/19/14  Assessment/Plan: 1. Subacute right MCA/evolutionary changes  per CT today but nothing acute - Neuro seeing patient. They did not recommend repeating stroke workup but did recommend MRI 2. Fevers -she by report has been febrile at home and also 10/25 at dialysis,  while on Vancomycin- repeat BC done as well as urine- on Vanc and zosyn- possible sources, catheter, graft, urine, recurrent endocarditis (culture negative but there was concern re false negative BC's) - last Echo 10/18, graft looks ok as does groin, CXR neg, urine dirty- culture pending; cath exit sit looks ok;  3. ESRD with hypokalemia K 2.6 - K given;(TTS)  suspect low due to poor intake- HD tomorrow - titrate EDW down; start on 4 K bath on Saturday, check labs and re-eval; graft should be ready to use 11/3- seems to be healing well 4. Hypertension/volume  - low gains, Thursday - kept in at EDW with BP 124/78  P 118 pre and 99/78 P 148; review  pf records fro 10/25 show pre HD BP 175/100 P 145 on Tues and 132/77 P 127 Sat 5. Anemia   Hgb 8.9 - continue max ESA Aranesp 200 tsat 37%  ferritin 139 9/17 6. Metabolic bone disease- not on VDRA, iPTH 71 9/13, not requiring binders 7. Nutrition- didn't eat breakfast - renal diet 8. SVT - on BB 9. Lupus - on Plaquenil 10. 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 Exam does not pinpoint fever source but urine dirty (previous pseudomonas UTI) Cultures pending. On vanco and zosyn. AVG not quite ready for use. HD tomorrow. CT no new stroke but evolutionary changes from prior. Neuro following.   Lakeeta Dobosz B,MD 01/25/2015 1:58 PM

## 2015-01-25 NOTE — ED Provider Notes (Signed)
CSN: [id number]     Arrival date & time 01/25/15  0320 History   By signing my name below, I, [name], attest that this documentation has been prepared under the direction and in the presence of Delora Fuel, MD.  Electronically Signed: Forrestine Chen, ED Scribe. 01/25/2015. 4:00 AM.  Chief Complaint  Patient presents with  . Fatigue  . Shortness of Breath   The history is provided by the patient and a relative. No language interpreter was used.    HPI Comments: [name] brought in by EMS is a 39 y.o. female with a PMHx of lupus, HTN, ESRD, and TIA who presents to the Emergency Department here after a fall just prior to arrival. Pt states she was attempting to sit up in her bed but slid down as she could no longer support herself. Denies any pain at this time. However, she now c/o ongoing, intermittent lack of appetite, cough, fatigue, weakness, fever, diarrhea, nausea and dizziness. Fever ranges from 102-104 since recent discharge from hospital. No OTC medications or home remedies attempted prior to arrival. [name] suffered a stroke almost 1 month ago resulting in L sided weakness. She was admitted in the hospital for a little over 2 months. Last full dialysis treatment yesterday.  PCP: Kristine Garbe, MD    Past Medical History  Diagnosis Date  . Hypertension   . Cardiac arrhythmia   . SVT (supraventricular tachycardia) ([place])     "today, last week, 2 wk ago; maybe 1 month ago, etc; started w/in last 3-4 yrs"(07/20/2012)  . Fainting     "~ 1 month ago; probably related to SVT" (07/20/2012)  . Shortness of breath     "related to SVT episodes" (07/20/2012)  . Chest pain at rest     "related to SVT" (07/20/2012)  . Lupus (systemic lupus erythematosus) ([place])   . GERD (gastroesophageal reflux disease)   . Fibromyalgia   . Irritable bowel syndrome (IBS)   . Hypercholesterolemia   . Heart murmur     "small" (12/25/2014)  . TIA (transient ischemic attack) 12/21/2014  . Sleep apnea      "don't wear mask anymore" (12/25/2014)  . Anemia   . History of blood transfusion "several"    "related to low counts"  . Daily headache   . Arthritis     "hips" (12/25/2014)  . Anxiety     "sometimes; don't take anything for it" (12/25/2014)  . ESRD (end stage renal disease) on dialysis ([place]) since 12/07/2014    "I've been doing it here on [facility]" (12/25/2014)  . Chronic renal failure, stage 4 (severe) (HCC)   . Renal insufficiency    Past Surgical History  Procedure Laterality Date  . Cesarean section  2001; 2010  . Cervical biopsy  2013  . Supraventricular tachycardia ablation  07/21/12     Dr. Cristopher Peru   . Peripheral vascular catheterization N/A 12/14/2014    Procedure: Upper Extremity Venography;  Surgeon: Elam Dutch, MD;  Location: [facility];  Service: Cardiovascular;  Laterality: N/A;  . Insertion hybrid anteriovenous gortex graft Left 12/18/2014    Procedure: INSERTION GORE HYBRID ARTERIOVENOUS GRAFT LEFT AXILLO-BRACHIAL.;  Surgeon: Serafina Mitchell, MD;  Location: [facility] OR;  Service: Vascular;  Laterality: Left;  . Ligation of arteriovenous  fistula Left 12/19/2014    Procedure: LIGATION OF FISTULA;  Surgeon: Elam Dutch, MD;  Location: [place];  Service: Vascular;  Laterality: Left;  . Appendectomy  2011  . Tubal  ligation  2010  . Tee without cardioversion N/A 12/25/2014    Procedure: TRANSESOPHAGEAL ECHOCARDIOGRAM (TEE);  Surgeon: Sueanne Margarita, MD;  Location: [place];  Service: Cardiovascular;  Laterality: N/A;  . Av fistula placement Left 01/04/2015    Procedure: INSERTION OF ARTERIOVENOUS (AV) GORE-TEX GRAFT THIGH;  Surgeon: Rosetta Posner, MD;  Location: [facility] OR;  Service: Vascular;  Laterality: Left;   Family History  Problem Relation Age of Onset  . Cancer Mother     breast and ovarian  . BRCA 1/2 Sister    Social History  Substance Use Topics  . Smoking status: Never Smoker   . Smokeless tobacco: Never Used  . Alcohol Use: No     Comment:  07/20/2012 "might have a beer once/yr"   OB History    Gravida Para Term Preterm AB TAB SAB Ectopic Multiple Living   2 2 2  0 0 0 0 0 0 2     Review of Systems  Constitutional: Positive for fever, appetite change and fatigue.  Respiratory: Positive for cough. Negative for shortness of breath.   Cardiovascular: Positive for leg swelling. Negative for chest pain.  Gastrointestinal: Positive for nausea. Negative for vomiting and abdominal pain.  Musculoskeletal: Negative for back pain and arthralgias.  Skin: Negative for rash.  Neurological: Positive for dizziness and weakness. Negative for headaches.  Psychiatric/Behavioral: Negative for confusion.  All other systems reviewed and are negative.     Allergies  Food  Home Medications   Prior to Admission medications   Medication Sig Start Date End Date Taking? Authorizing Provider  aspirin EC 81 MG EC tablet Take 1 tablet (81 mg total) by mouth daily. 01/05/15   Zada Finders, MD  atorvastatin (LIPITOR) 20 MG tablet Take 1 tablet (20 mg total) by mouth daily at 6 PM. 01/17/15   Lavon Paganini Angiulli, PA-C  Calcium Carbonate Antacid (TUMS PO) Take 1-2 tablets by mouth daily as needed (heartburn).    Historical Provider, MD  cyclobenzaprine (FLEXERIL) 10 MG tablet Take 1 tablet (10 mg total) by mouth at bedtime as needed for muscle spasms. 01/17/15   Lavon Paganini Angiulli, PA-C  hydroxychloroquine (PLAQUENIL) 200 MG tablet Take 2 tablets (400 mg total) by mouth daily. 01/17/15   Lavon Paganini Angiulli, PA-C  isosorbide mononitrate (IMDUR) 30 MG 24 hr tablet Take 1 tablet (30 mg total) by mouth daily. 01/17/15   Lavon Paganini Angiulli, PA-C  metoprolol succinate (TOPROL-XL) 25 MG 24 hr tablet Take 1 tablet (25 mg total) by mouth at bedtime. 01/17/15   Lavon Paganini Angiulli, PA-C  multivitamin (RENA-VIT) TABS tablet Take 1 tablet by mouth at bedtime. 01/17/15   Lavon Paganini Angiulli, PA-C  oxyCODONE-acetaminophen (PERCOCET) 5-325 MG tablet Take 1 tablet by mouth every  6 (six) hours as needed for severe pain. 01/17/15   Lavon Paganini Angiulli, PA-C  pantoprazole (PROTONIX) 20 MG tablet Take 1 tablet (20 mg total) by mouth daily. 01/17/15   Lavon Paganini Angiulli, PA-C  vancomycin (VANCOCIN) 1 GM/200ML SOLN Inject 200 mLs (1,000 mg total) into the vein Every Tuesday,Thursday,and Saturday with dialysis. 01/05/15   Zada Finders, MD   Triage Vitals: SpO2 100%  LMP 12/24/2014 (Exact Date)   Physical Exam  Constitutional: She is oriented to person, place, and time. She appears well-developed and well-nourished. No distress.  Obese   HENT:  Head: Normocephalic and atraumatic.  Eyes: EOM are normal. Pupils are equal, round, and reactive to light.  Neck: Normal range of motion. Neck supple. No  JVD present.  Cardiovascular: Regular rhythm and normal heart sounds.  Tachycardia present.   No murmur heard. 3/6 systolic ejection murmur over L sternal border   Pulmonary/Chest: Effort normal and breath sounds normal. She has no wheezes. She has no rales. She exhibits no tenderness.  Abdominal: Soft. Bowel sounds are normal. She exhibits no distension and no mass. There is no tenderness.  Musculoskeletal: Normal range of motion. She exhibits no edema.  L leg circumference  3 cm great that  L leg 2 plus edema to the L leg  1 plus edema to the R leg Dialysis catheter port noted to R thigh Extensive aphasia L hemiparesis   Lymphadenopathy:    She has no cervical adenopathy.  Neurological: She is alert and oriented to person, place, and time. No cranial nerve deficit. She exhibits normal muscle tone. Coordination normal.  Skin: Skin is warm and dry. No rash noted.  Psychiatric: She has a normal mood and affect. Her behavior is normal. Judgment and thought content normal.  Nursing note and vitals reviewed.   ED Course  Procedures (including critical care time)  DIAGNOSTIC STUDIES: Oxygen Saturation is 100% on RA, Normal by my interpretation.    COORDINATION OF CARE: 3:51  AM-Discussed treatment plan with pt at bedside and pt agreed to plan.     Labs Review Results for orders placed or performed during the hospital encounter of 78/58/85  Basic metabolic panel  Result Value Ref Range   Sodium 137 135 - 145 mmol/L   Potassium 2.6 (LL) 3.5 - 5.1 mmol/L   Chloride 92 (L) 101 - 111 mmol/L   CO2 28 22 - 32 mmol/L   Glucose, Bld 65 65 - 99 mg/dL   BUN 10 6 - 20 mg/dL   Creatinine, Ser 5.32 (H) 0.44 - 1.00 mg/dL   Calcium 7.8 (L) 8.9 - 10.3 mg/dL   GFR calc non Af Amer 9 (L) >60 mL/min   GFR calc Af Amer 11 (L) >60 mL/min   Anion gap 17 (H) 5 - 15  CBC  Result Value Ref Range   WBC 16.2 (H) 4.0 - 10.5 K/uL   RBC 2.94 (L) 3.87 - 5.11 MIL/uL   Hemoglobin 8.9 (L) 12.0 - 15.0 g/dL   HCT 27.0 (L) 36.0 - 46.0 %   MCV 91.8 78.0 - 100.0 fL   MCH 30.3 26.0 - 34.0 pg   MCHC 33.0 30.0 - 36.0 g/dL   RDW 18.0 (H) 11.5 - 15.5 %   Platelets 187 150 - 400 K/uL  Urinalysis, Routine w reflex microscopic (not at [facility])  Result Value Ref Range   Color, Urine YELLOW YELLOW   APPearance TURBID (A) CLEAR   Specific Gravity, Urine 1.033 (H) 1.005 - 1.030   pH 6.0 5.0 - 8.0   Glucose, UA NEGATIVE NEGATIVE mg/dL   Hgb urine dipstick LARGE (A) NEGATIVE   Bilirubin Urine SMALL (A) NEGATIVE   Ketones, ur 15 (A) NEGATIVE mg/dL   Protein, ur >300 (A) NEGATIVE mg/dL   Urobilinogen, UA 0.2 0.0 - 1.0 mg/dL   Nitrite POSITIVE (A) NEGATIVE   Leukocytes, UA MODERATE (A) NEGATIVE  Differential  Result Value Ref Range   Neutrophils Relative % 94 %   Lymphocytes Relative 2 %   Monocytes Relative 4 %   Eosinophils Relative 0 %   Basophils Relative 0 %   Neutro Abs 15.3 (H) 1.7 - 7.7 K/uL   Lymphs Abs 0.3 (L) 0.7 - 4.0 K/uL   Monocytes Absolute  0.6 0.1 - 1.0 K/uL   Eosinophils Absolute 0.0 0.0 - 0.7 K/uL   Basophils Absolute 0.0 0.0 - 0.1 K/uL   RBC Morphology POLYCHROMASIA PRESENT    WBC Morphology INCREASED BANDS (>20% BANDS)   Urine microscopic-add on  Result Value Ref Range    Squamous Epithelial / LPF FEW (A) RARE   WBC, UA TOO NUMEROUS TO COUNT <3 WBC/hpf   RBC / HPF 7-10 <3 RBC/hpf   Bacteria, UA MANY (A) RARE   Casts GRANULAR CAST (A) NEGATIVE   Urine-Other MICROSCOPIC EXAM PERFORMED ON UNCONCENTRATED URINE   Protime-INR  Result Value Ref Range   Prothrombin Time 18.5 (H) 11.6 - 15.2 seconds   INR 1.54 (H) 0.00 - 1.49  I-Stat CG4 Lactic Acid, ED  Result Value Ref Range   Lactic Acid, Venous 3.04 (HH) 0.5 - 2.0 mmol/L   Comment NOTIFIED PHYSICIAN    Ct Abdomen Pelvis Wo Contrast  01/15/2015  CLINICAL DATA:  Acute onset of fever, nausea and abdominal discomfort. Initial encounter. EXAM: CT ABDOMEN AND PELVIS WITHOUT CONTRAST TECHNIQUE: Multidetector CT imaging of the abdomen and pelvis was performed following the standard protocol without IV contrast. COMPARISON:  CT of the abdomen and pelvis from 12/03/2014 FINDINGS: A small left pleural effusion is noted, with left basilar airspace opacity likely reflecting atelectasis. The liver and spleen are unremarkable in appearance. The gallbladder is within normal limits. The pancreas and adrenal glands are unremarkable. Nonspecific perinephric stranding is noted bilaterally, with trace associated fluid tracking along Gerota's fascia. No renal or ureteral stones are identified. No free fluid is identified. The small bowel is unremarkable in appearance. The stomach is within normal limits. No acute vascular abnormalities are seen. The patient is status post appendectomy. The colon is unremarkable in appearance. The bladder is decompressed and not well assessed. The uterus is unremarkable in appearance. The ovaries are grossly symmetric. No suspicious adnexal masses are seen. No inguinal lymphadenopathy is seen. Soft tissue edema is noted at both flanks, and diffuse soft tissue inflammation is seen tracking along the proximal left thigh. Postoperative change is noted about the left inguinal region, with underlying vascular  calcification. A right femoral line is noted ending at the infrarenal IVC. No acute osseous abnormalities are identified. IMPRESSION: 1. No acute abnormality seen to explain the patient's symptoms. 2. Soft tissue edema at both flanks. Diffuse soft inflammation tracks along the proximal left thigh. Postoperative change at the left inguinal region. Would correlate for any evidence of venous obstruction or cellulitis. 3. Small left pleural effusion, with left basilar airspace opacity likely reflecting atelectasis. 4. Nonspecific bilateral perinephric stranding is relatively stable and likely reflects the patient's baseline. Electronically Signed   By: Garald Balding M.D.   On: 01/15/2015 03:00   Dg Chest 2 View  01/11/2015  CLINICAL DATA:  Fever. EXAM: CHEST  2 VIEW COMPARISON:  November 13, 2014. FINDINGS: The heart size and mediastinal contours are within normal limits. No pneumothorax or pleural effusion is noted. Mild left basilar subsegmental atelectasis is noted. Right lung is clear. Right internal jugular catheter is noted with distal tip overlying expected position of the SVC. The visualized skeletal structures are unremarkable. IMPRESSION: Mild left basilar subsegmental atelectasis. Electronically Signed   By: Marijo Conception, M.D.   On: 01/11/2015 16:15   Ir Removal Tun Cv Cath W/o Fl  01/17/2015  CLINICAL DATA:  Request for right IJ tunneled central catheter removal that is no longer needed, placed 11/29/2014 by IR for poor  venous access. EXAM: REMOVAL TUNNELED CENTRAL VENOUS CATHETER PROCEDURE: The patient's right chest and catheter was prepped and draped in a normal sterile fashion. Using gentle manual retraction the cuff of the catheter was exposed and the catheter was removed in it's entirety. Pressure was held till hemostasis was obtained. A sterile dressing was applied. The patient tolerated the procedure well with no immediate complications. COMPLICATIONS: None immediate. IMPRESSION: Successful  catheter removal as described above. Read By:  Tsosie Billing PA-C Electronically Signed   By: Corrie Mckusick D.O.   On: 01/17/2015 14:32   Dg Chest Port 1 View  01/25/2015  CLINICAL DATA:  Acute onset of fever.  Initial encounter. EXAM: PORTABLE CHEST 1 VIEW COMPARISON:  Chest radiograph performed 01/11/2015 FINDINGS: The lungs are hypoexpanded. A small left pleural effusion is noted, with left basilar airspace opacity, which may reflect mild pneumonia. There is no evidence of pneumothorax. The cardiomediastinal silhouette is borderline enlarged. No acute osseous abnormalities are seen. IMPRESSION: Lungs hypoexpanded. Small left pleural effusion, with left basilar airspace opacity, which may reflect mild pneumonia. Borderline cardiomegaly. Electronically Signed   By: Garald Balding M.D.   On: 01/25/2015 04:16     Imaging Review Dg Chest Port 1 View  01/25/2015  CLINICAL DATA:  Acute onset of fever.  Initial encounter. EXAM: PORTABLE CHEST 1 VIEW COMPARISON:  Chest radiograph performed 01/11/2015 FINDINGS: The lungs are hypoexpanded. A small left pleural effusion is noted, with left basilar airspace opacity, which may reflect mild pneumonia. There is no evidence of pneumothorax. The cardiomediastinal silhouette is borderline enlarged. No acute osseous abnormalities are seen. IMPRESSION: Lungs hypoexpanded. Small left pleural effusion, with left basilar airspace opacity, which may reflect mild pneumonia. Borderline cardiomegaly. Electronically Signed   By: Garald Balding M.D.   On: 01/25/2015 04:16   I have personally reviewed and evaluated these images and lab results as part of my medical decision-making.   EKG Interpretation   Date/Time:  Friday January 25 2015 03:31:01 EDT Ventricular Rate:  133 PR Interval:  80 QRS Duration: 88 QT Interval:  375 QTC Calculation: 558 R Axis:   16 Text Interpretation:  Sinus tachycardia Consider right atrial enlargement  Borderline T abnormalities, diffuse  leads Borderline ST elevation,  anterior leads Prolonged QT interval When compared with ECG of 01/16/2015,  QT has lengthened Confirmed by [facility]  MD, Lucinda Spells ([id number]) on 01/25/2015  7:21:15 AM       CRITICAL CARE Performed by: XIPJA,SNKNL Total critical care time: 35 minutes minutes Critical care time was exclusive of separately billable procedures and treating other patients. Critical care was necessary to treat or prevent imminent or life-threatening deterioration. Critical care was time spent personally by me on the following activities: development of treatment plan with patient and/or surrogate as well as nursing, discussions with consultants, evaluation of patient's response to treatment, examination of patient, obtaining history from patient or surrogate, ordering and performing treatments and interventions, ordering and review of laboratory studies, ordering and review of radiographic studies, pulse oximetry and re-evaluation of patient's condition. MDM   Final diagnoses:  Fever, unspecified  Elevated lactic acid level  End-stage renal disease on hemodialysis (HCC)  Hypokalemia Urinary Tract Infection Anemia of renal disease    Fever with possible sepsis. She is clearly tachycardic but not hypotensive. Review of old records shows that she was just released from the hospital after a.m. prolonged stay for combination of stroke, ongoing fevers with possible endocarditis. Treatment for endocarditis was completed on October 25. She does  not appear toxic on exam. Since fevers have been an ongoing issue, I am not sure if this truly represents sepsis or not. She started on early goal-directed fluid therapy and sepsis protocol was followed but antibiotics are withheld initially. Case is discussed with Dr. Gordy Levan of internal medicine teaching service who agrees to admit the patient. Infectious disease input will clearly be needed.  After patient had been admitted, urinalysis did come back showing  evidence of urinary tract infection. I am not sure this is the source of her fever, but she started on antibiotics for healthcare associated urinary tract infection.  I personally performed the services described in this documentation, which was scribed in my presence. The recorded information has been reviewed and is accurate.   \   Delora Fuel, MD 03/70/48 [id number]

## 2015-01-25 NOTE — Consult Note (Addendum)
Referring Physician: Evette Doffing    Chief Complaint: Code stroke   HPI:                                                                                                                                         [name] is an 39 y.o. female with history of R MCA CVA, HTN, She had just completed a prolonged hospital course (9/5-10/8) and stay in inpatient rehab (10/10 - 10/20).  While in inpatient rehab, she continued to spike fevers as high as 103. Potential sources of infection included a potential endocarditis, PICC site, but no definitive source was found. She was continued on Zosyn as an inpatient until 10/19 and received vancomycin IV from her dialysis center on [address] until 10/25. After discharge on 10/20, [name]'s mother said she has continued to have spiking fevers, as high as 103, chills, night sweats, left leg swelling and left leg graft pain and this am fell due to pain in left leg and weakness. She has recently been taken off her Coumadin which she was taking for valvular vegetation. Today she returned to hospital due to to not feeling well and increased pain and swelling in her left leg. Patietn is being admitted for work up of sepsis. While on the floor residents noted she seemed to be weaker on the left leg.  Code stroke was called. On arrival patient was complaining of left leg pain and stated she was not moving due to pain. Left leg is warm and swollen. In talking with the patient she states her left leg has been weak for 2 weeks since surgery. It has been unchanged. Currently her left leg does feel weak but unchanged.   Date last known well: Unable to determine Time last known well: Unable to determine tPA Given: No: no clear LSN, PT elevated, recent infarct   Past Medical History  Diagnosis Date  . Hypertension   . Cardiac arrhythmia   . SVT (supraventricular tachycardia) ([place])     "today, last week, 2 wk ago; maybe 1 month ago, etc; started w/in last 3-4 yrs"(07/20/2012)   . Fainting     "~ 1 month ago; probably related to SVT" (07/20/2012)  . Shortness of breath     "related to SVT episodes" (07/20/2012)  . Chest pain at rest     "related to SVT" (07/20/2012)  . Lupus (systemic lupus erythematosus) ([place])   . GERD (gastroesophageal reflux disease)   . Fibromyalgia   . Irritable bowel syndrome (IBS)   . Hypercholesterolemia   . Heart murmur     "small" (12/25/2014)  . TIA (transient ischemic attack) 12/21/2014  . Sleep apnea     "don't wear mask anymore" (12/25/2014)  . Anemia   . History of blood transfusion "several"    "related to low counts"  . Daily headache   . Arthritis     "hips" (12/25/2014)  .  Anxiety     "sometimes; don't take anything for it" (12/25/2014)  . ESRD (end stage renal disease) on dialysis ([place]) since 12/07/2014    "I've been doing it here on [facility]" (12/25/2014)  . Stroke [facility])     Past Surgical History  Procedure Laterality Date  . Cesarean section  2001; 2010  . Cervical biopsy  2013  . Supraventricular tachycardia ablation  07/21/12     Dr. Cristopher Peru   . Peripheral vascular catheterization N/A 12/14/2014    Procedure: Upper Extremity Venography;  Surgeon: Elam Dutch, MD;  Location: [facility];  Service: Cardiovascular;  Laterality: N/A;  . Insertion hybrid anteriovenous gortex graft Left 12/18/2014    Procedure: INSERTION GORE HYBRID ARTERIOVENOUS GRAFT LEFT AXILLO-BRACHIAL.;  Surgeon: Serafina Mitchell, MD;  Location: [facility] OR;  Service: Vascular;  Laterality: Left;  . Ligation of arteriovenous  fistula Left 12/19/2014    Procedure: LIGATION OF FISTULA;  Surgeon: Elam Dutch, MD;  Location: [facility];  Service: Vascular;  Laterality: Left;  . Appendectomy  2011  . Tubal ligation  2010  . Tee without cardioversion N/A 12/25/2014    Procedure: TRANSESOPHAGEAL ECHOCARDIOGRAM (TEE);  Surgeon: Sueanne Margarita, MD;  Location: [place];  Service: Cardiovascular;  Laterality: N/A;  . Av fistula placement Left 01/04/2015     Procedure: INSERTION OF ARTERIOVENOUS (AV) GORE-TEX GRAFT THIGH;  Surgeon: Rosetta Posner, MD;  Location: [facility] OR;  Service: Vascular;  Laterality: Left;    Family History  Problem Relation Age of Onset  . Cancer Mother     breast and ovarian  . BRCA 1/2 Sister    Social History:  reports that she has never smoked. She has never used smokeless tobacco. She reports that she does not drink alcohol or use illicit drugs.  Allergies:  Allergies  Allergen Reactions  . Food Swelling    Red peppers    Medications:                                                                                                                           Prior to Admission:  Prescriptions prior to admission  Medication Sig Dispense Refill Last Dose  . atorvastatin (LIPITOR) 20 MG tablet Take 1 tablet (20 mg total) by mouth daily at 6 PM. 30 tablet 2 01/24/2015 at Unknown time  . Calcium Carbonate Antacid (TUMS PO) Take 1-2 tablets by mouth daily as needed (heartburn).   unk  . cyclobenzaprine (FLEXERIL) 10 MG tablet Take 1 tablet (10 mg total) by mouth at bedtime as needed for muscle spasms. 30 tablet 0 01/23/2015  . hydroxychloroquine (PLAQUENIL) 200 MG tablet Take 2 tablets (400 mg total) by mouth daily. 60 tablet 1 01/24/2015 at Unknown time  . isosorbide mononitrate (IMDUR) 30 MG 24 hr tablet Take 1 tablet (30 mg total) by mouth daily. 30 tablet 1 01/24/2015 at Unknown time  . metoprolol succinate (TOPROL-XL) 25 MG 24 hr tablet Take 1  tablet (25 mg total) by mouth at bedtime. 30 tablet 1 01/24/2015 at 2200  . multivitamin (RENA-VIT) TABS tablet Take 1 tablet by mouth at bedtime. 30 tablet 0 01/24/2015 at Unknown time  . oxyCODONE-acetaminophen (PERCOCET) 5-325 MG tablet Take 1 tablet by mouth every 6 (six) hours as needed for severe pain. 90 tablet 0 01/17/2015  . pantoprazole (PROTONIX) 20 MG tablet Take 1 tablet (20 mg total) by mouth daily. 30 tablet 1 01/24/2015 at Unknown time  . vancomycin (VANCOCIN) 1  GM/200ML SOLN Inject 200 mLs (1,000 mg total) into the vein Every Tuesday,Thursday,and Saturday with dialysis. 4000 mL  01/24/2015 at Unknown time   Scheduled: . atorvastatin  20 mg Oral q1800  . hydroxychloroquine  400 mg Oral Daily  . isosorbide mononitrate  30 mg Oral Daily  . metoprolol succinate  25 mg Oral QHS  . multivitamin  1 tablet Oral QHS  . pantoprazole  20 mg Oral Daily  . piperacillin-tazobactam (ZOSYN)  IV  2.25 g Intravenous Q8H  . sodium chloride  3 mL Intravenous Q12H  . sodium chloride  3 mL Intravenous Q12H  . [START ON 01/26/2015] vancomycin  1,000 mg Intravenous Q T,Th,Sa-HD  . warfarin  0.5 mg Oral ONCE-1800  . Warfarin - Pharmacist Dosing Inpatient   Does not apply q1800  . [DISCONTINUED] cefTAZidime (FORTAZ)  IV  2 g Intravenous Once    ROS:                                                                                                                                       History obtained from the patient  General ROS: negative for - chills, fatigue, fever, night sweats, weight gain or weight loss Psychological ROS: negative for - behavioral disorder, hallucinations, memory difficulties, mood swings or suicidal ideation Ophthalmic ROS: negative for - blurry vision, double vision, eye pain or loss of vision ENT ROS: negative for - epistaxis, nasal discharge, oral lesions, sore throat, tinnitus or vertigo Allergy and Immunology ROS: negative for - hives or itchy/watery eyes Hematological and Lymphatic ROS: negative for - bleeding problems, bruising or swollen lymph nodes Endocrine ROS: negative for - galactorrhea, hair pattern changes, polydipsia/polyuria or temperature intolerance Respiratory ROS: negative for - cough, hemoptysis, shortness of breath or wheezing Cardiovascular ROS: negative for - chest pain, dyspnea on exertion, edema or irregular heartbeat Gastrointestinal ROS: negative for - abdominal pain, diarrhea, hematemesis, nausea/vomiting or stool  incontinence Genito-Urinary ROS: negative for - dysuria, hematuria, incontinence or urinary frequency/urgency Musculoskeletal ROS: negative for - joint swelling or muscular weakness Neurological ROS: as noted in HPI Dermatological ROS: negative for rash and skin lesion changes  Neurologic Examination:  Blood pressure 100/50, pulse 115, temperature 99.7 F (37.6 C), temperature source Oral, resp. rate 14, height $RemoveBe'5\' 3"'hhSNczton$  (1.6 m), weight 89.7 kg (197 lb 12 oz), last menstrual period 01/21/2015, SpO2 99 %.  HEENT-  Normocephalic, no lesions, without obvious abnormality.  Normal external eye and conjunctiva.  Normal TM's bilaterally.  Normal auditory canals and external ears. Normal external nose, mucus membranes and septum.  Normal pharynx. Cardiovascular- S1, S2 normal, pulses palpable throughout   Lungs- chest clear, no wheezing, rales, normal symmetric air entry Abdomen- normal findings: bowel sounds normal Extremities- positive findings: swelling in left LE and hot to touch Lymph-no adenopathy palpable Musculoskeletal-no joint tenderness, deformity or swelling Skin-warm and dry, no hyperpigmentation, vitiligo, or suspicious lesions  Neurological Examination Mental Status: Alert, oriented, thought content appropriate.  Speech fluent without evidence of aphasia.  Able to follow 3 step commands without difficulty. Cranial Nerves: II: Discs flat bilaterally; Visual fields grossly normal, pupils equal, round, reactive to light and accommodation III,IV, VI: ptosis not present, extra-ocular motions intact bilaterally V,VII: smile symmetric, facial light touch sensation normal bilaterally VIII: hearing normal bilaterally IX,X: uvula rises symmetrically XI: bilateral shoulder shrug XII: midline tongue extension Motor: Right : Upper extremity   5/5    Left:     Upper extremity   5/5  Lower  extremity   4/5     Lower extremity   4/5 Both legs have drift.   Tone and bulk:normal tone throughout; no atrophy noted Sensory: Pinprick and light touch intact throughout, bilaterally Deep Tendon Reflexes: 2+ and symmetric throughout and 2+ KJ no AJ Plantars: Right: downgoing   Left: downgoing Cerebellar: normal finger-to-nose, unable to do H-S due to pain Gait: not tested       Lab Results: Basic Metabolic Panel:  Recent Labs Lab 01/25/15 0405  NA 137  K 2.6*  CL 92*  CO2 28  GLUCOSE 65  BUN 10  CREATININE 5.32*  CALCIUM 7.8*    Liver Function Tests: No results for input(s): AST, ALT, ALKPHOS, BILITOT, PROT, ALBUMIN in the last 168 hours. No results for input(s): LIPASE, AMYLASE in the last 168 hours. No results for input(s): AMMONIA in the last 168 hours.  CBC:  Recent Labs Lab 01/25/15 0405  WBC 16.2*  NEUTROABS 15.3*  HGB 8.9*  HCT 27.0*  MCV 91.8  PLT 187    Cardiac Enzymes: No results for input(s): CKTOTAL, CKMB, CKMBINDEX, TROPONINI in the last 168 hours.  Lipid Panel: No results for input(s): CHOL, TRIG, HDL, CHOLHDL, VLDL, LDLCALC in the last 168 hours.  CBG: No results for input(s): GLUCAP in the last 168 hours.  Microbiology: Results for orders placed or performed during the hospital encounter of 01/05/15  Urine culture     Status: None   Collection Time: 01/11/15  4:35 PM  Result Value Ref Range Status   Specimen Description URINE, CATHETERIZED  Final   Special Requests NONE  Final   Culture NO GROWTH 2 DAYS  Final   Report Status 01/13/2015 FINAL  Final  Culture, blood (routine x 2)     Status: None   Collection Time: 01/11/15  4:55 PM  Result Value Ref Range Status   Specimen Description BLOOD BLOOD LEFT HAND  Final   Special Requests BOTTLES DRAWN AEROBIC ONLY 5CC  Final   Culture NO GROWTH 5 DAYS  Final   Report Status 01/16/2015 FINAL  Final  Culture, blood (routine x 2)     Status: None   Collection Time:  01/11/15  5:06  PM  Result Value Ref Range Status   Specimen Description BLOOD BLOOD RIGHT HAND  Final   Special Requests BOTTLES DRAWN AEROBIC ONLY 5CC  Final   Culture NO GROWTH 5 DAYS  Final   Report Status 01/16/2015 FINAL  Final  Culture, blood (routine x 2)     Status: None   Collection Time: 01/13/15  3:20 PM  Result Value Ref Range Status   Specimen Description BLOOD PICC LINE  Final   Special Requests BOTTLES DRAWN AEROBIC AND ANAEROBIC 5CC  Final   Culture NO GROWTH 6 DAYS  Final   Report Status 01/19/2015 FINAL  Final  Culture, blood (routine x 2)     Status: None   Collection Time: 01/14/15 11:34 AM  Result Value Ref Range Status   Specimen Description BLOOD RIGHT HAND  Final   Special Requests BOTTLES DRAWN AEROBIC AND ANAEROBIC 5CC  Final   Culture NO GROWTH 5 DAYS  Final   Report Status 01/19/2015 FINAL  Final  Culture, blood (routine x 2)     Status: None   Collection Time: 01/14/15 11:50 AM  Result Value Ref Range Status   Specimen Description BLOOD RIGHT FINGER  Final   Special Requests BOTTLES DRAWN AEROBIC AND ANAEROBIC 5CC  Final   Culture NO GROWTH 5 DAYS  Final   Report Status 01/19/2015 FINAL  Final  C difficile quick scan w PCR reflex     Status: None   Collection Time: 01/15/15  5:18 AM  Result Value Ref Range Status   C Diff antigen NEGATIVE NEGATIVE Final   C Diff toxin NEGATIVE NEGATIVE Final   C Diff interpretation Negative for toxigenic C. difficile  Final    Coagulation Studies:  Recent Labs  01/25/15 0440  LABPROT 18.5*  INR 1.54*    Imaging: Dg Chest Port 1 View  01/25/2015  CLINICAL DATA:  Acute onset of fever.  Initial encounter. EXAM: PORTABLE CHEST 1 VIEW COMPARISON:  Chest radiograph performed 01/11/2015 FINDINGS: The lungs are hypoexpanded. A small left pleural effusion is noted, with left basilar airspace opacity, which may reflect mild pneumonia. There is no evidence of pneumothorax. The cardiomediastinal silhouette is borderline enlarged. No  acute osseous abnormalities are seen. IMPRESSION: Lungs hypoexpanded. Small left pleural effusion, with left basilar airspace opacity, which may reflect mild pneumonia. Borderline cardiomegaly. Electronically Signed   By: Garald Balding M.D.   On: 01/25/2015 04:16    Etta Quill PA-C [facility] [phone]  01/25/2015, 10:59 AM   Patient seen and examined.  Clinical course and management discussed.  Necessary edits performed.  I agree with the above.  Assessment and plan of care developed and discussed below.   Assessment: 39 y.o. female admitted with sepsis and lower extremity weakness.  No acute exacerbation of weakness but patient currently lethargic and ill with a recent history (12/21/14) of a right MCA infarct.  Work up in September revealed a normal EF on echocardiogram and carotid dopplers with no hemodynamically significant stenosis.  TEE performed as well and showed mitral valve vegetations.  Patient was started on Coumadin.  CTA unremarkable.  She may have some unmasking of symptoms from that event.  Head CT personally reviewed and shows no acute changes.  Patient on no antiplatelet therapy at home with vascular risk factors noted.    Stroke Risk Factors - hyperlipidemia and hypertension   Recommendations: 1.  Would not repeat stroke work up at this time 2.  MRI of the  brain without contrast 3.  Restart Coumadin.  May bridge with heparin 4.  Liberalize blood pressure for improved cerebral perfusion  Case discussed with Dr. Precious Gilding, MD [facility] [phone]  01/25/2015  12:45 PM

## 2015-01-25 NOTE — ED Notes (Signed)
IV team at bedside 

## 2015-01-25 NOTE — Progress Notes (Signed)
MD Merrilyn Puma paged to make him aware of patient change in motor deficits from prior admission, states he will come to bedside to assess patient.

## 2015-01-25 NOTE — Progress Notes (Signed)
NURSING PROGRESS NOTE  [name] [id number] Admission Data: 01/25/2015 7:33 AM Attending Provider: Axel Filler, MD DGU:[id number],[id number] D, MD Code Status: Full  Allergies:  Food Past Medical History:   has a past medical history of Hypertension; Cardiac arrhythmia; SVT (supraventricular tachycardia) ([place]); Fainting; Shortness of breath; Chest pain at rest; Lupus (systemic lupus erythematosus) ([place]); GERD (gastroesophageal reflux disease); Fibromyalgia; Irritable bowel syndrome (IBS); Hypercholesterolemia; Heart murmur; TIA (transient ischemic attack) (12/21/2014); Sleep apnea; Anemia; History of blood transfusion ("several"); Daily headache; Arthritis; Anxiety; ESRD (end stage renal disease) on dialysis ([place]) (since 12/07/2014); Chronic renal failure, stage 4 (severe) ([place]); Renal insufficiency; and Stroke ([place]). Past Surgical History:   has past surgical history that includes Cesarean section (2001; 2010); Cervical biopsy (2013); Supraventricular tachycardia ablation (07/21/12 ); Cardiac catheterization (N/A, 12/14/2014); Insertion hybrid anteriovenous gortex graft (Left, 12/18/2014); Ligation of arteriovenous  fistula (Left, 12/19/2014); Appendectomy (2011); Tubal ligation (2010); TEE without cardioversion (N/A, 12/25/2014); and AV fistula placement (Left, 01/04/2015). Social History:   reports that she has never smoked. She has never used smokeless tobacco. She reports that she does not drink alcohol or use illicit drugs.  [name] is a 39 y.o. female patient admitted from ED:   Last Documented Vital Signs: Blood pressure 100/50, pulse 115, temperature 99.7 F (37.6 C), temperature source Oral, resp. rate 14, height 5\' 3"  (1.6 m), weight 89.7 kg (197 lb 12 oz), last menstrual period 12/24/2014, SpO2 99 %.  Cardiac Monitoring: Box # 10 in place. Cardiac monitor yields:sinus tachycardia.  IV Fluids:  IV in place, occlusive dsg intact without redness, IV cath antecubital right, condition  patent and no redness normal saline (bolus infusing from ED upon arrival)  Skin: WDL  Patient/Family orientated to room. Information packet given to patient/family. Admission inpatient armband information verified with patient/family to include name and date of birth and placed on patient arm. Side rails up x 2, fall assessment and education completed with patient/family. Patient/family able to verbalize understanding of risk associated with falls and verbalized understanding to call for assistance before getting out of bed. Call light within reach. Patient/family able to voice and demonstrate understanding of unit orientation instructions.    Will continue to evaluate and treat per MD orders.   Hendricks Limes RN, BS, BSN

## 2015-01-25 NOTE — Progress Notes (Signed)
ANTIBIOTIC CONSULT NOTE - INITIAL  Pharmacy Consult for Vancomycin, Zosyn Indication: sepsis  Allergies  Allergen Reactions  . Food Swelling    Red peppers    Patient Measurements: Height: 5\' 3"  (160 cm) Weight: 197 lb 12 oz (89.7 kg) IBW/kg (Calculated) : 52.4  Vital Signs: Temp: 99.7 F (37.6 C) (10/28 0709) Temp Source: Oral (10/28 0709) BP: 100/50 mmHg (10/28 0709) Pulse Rate: 115 (10/28 0709) Intake/Output from previous day: 10/27 0701 - 10/28 0700 In: 500 [I.V.:500] Out: 25 [Urine:25] Intake/Output from this shift:    Labs:  Recent Labs  01/25/15 0405  WBC 16.2*  HGB 8.9*  PLT 187  CREATININE 5.32*   Estimated Creatinine Clearance: 15.1 mL/min (by C-G formula based on Cr of 5.32). No results for input(s): VANCOTROUGH, VANCOPEAK, VANCORANDOM, GENTTROUGH, GENTPEAK, GENTRANDOM, TOBRATROUGH, TOBRAPEAK, TOBRARND, AMIKACINPEAK, AMIKACINTROU, AMIKACIN in the last 72 hours.   Microbiology: Recent Results (from the past 720 hour(s))  Urine culture     Status: None   Collection Time: 12/29/14  7:26 AM  Result Value Ref Range Status   Specimen Description URINE, RANDOM  Final   Special Requests NONE  Final   Culture >=100,000 COLONIES/mL PSEUDOMONAS AERUGINOSA  Final   Report Status 01/01/2015 FINAL  Final   Organism ID, Bacteria PSEUDOMONAS AERUGINOSA  Final      Susceptibility   Pseudomonas aeruginosa - MIC*    CEFTAZIDIME 4 SENSITIVE Sensitive     CIPROFLOXACIN 0.5 INTERMEDIATE Intermediate     GENTAMICIN <=1 SENSITIVE Sensitive     IMIPENEM 2 SENSITIVE Sensitive     PIP/TAZO 16 SENSITIVE Sensitive     CEFEPIME 8 SENSITIVE Sensitive     * >=100,000 COLONIES/mL PSEUDOMONAS AERUGINOSA  Urine culture     Status: None   Collection Time: 01/11/15  4:35 PM  Result Value Ref Range Status   Specimen Description URINE, CATHETERIZED  Final   Special Requests NONE  Final   Culture NO GROWTH 2 DAYS  Final   Report Status 01/13/2015 FINAL  Final  Culture, blood  (routine x 2)     Status: None   Collection Time: 01/11/15  4:55 PM  Result Value Ref Range Status   Specimen Description BLOOD BLOOD LEFT HAND  Final   Special Requests BOTTLES DRAWN AEROBIC ONLY 5CC  Final   Culture NO GROWTH 5 DAYS  Final   Report Status 01/16/2015 FINAL  Final  Culture, blood (routine x 2)     Status: None   Collection Time: 01/11/15  5:06 PM  Result Value Ref Range Status   Specimen Description BLOOD BLOOD RIGHT HAND  Final   Special Requests BOTTLES DRAWN AEROBIC ONLY 5CC  Final   Culture NO GROWTH 5 DAYS  Final   Report Status 01/16/2015 FINAL  Final  Culture, blood (routine x 2)     Status: None   Collection Time: 01/13/15  3:20 PM  Result Value Ref Range Status   Specimen Description BLOOD PICC LINE  Final   Special Requests BOTTLES DRAWN AEROBIC AND ANAEROBIC 5CC  Final   Culture NO GROWTH 6 DAYS  Final   Report Status 01/19/2015 FINAL  Final  Culture, blood (routine x 2)     Status: None   Collection Time: 01/14/15 11:34 AM  Result Value Ref Range Status   Specimen Description BLOOD RIGHT HAND  Final   Special Requests BOTTLES DRAWN AEROBIC AND ANAEROBIC 5CC  Final   Culture NO GROWTH 5 DAYS  Final   Report Status 01/19/2015  FINAL  Final  Culture, blood (routine x 2)     Status: None   Collection Time: 01/14/15 11:50 AM  Result Value Ref Range Status   Specimen Description BLOOD RIGHT FINGER  Final   Special Requests BOTTLES DRAWN AEROBIC AND ANAEROBIC 5CC  Final   Culture NO GROWTH 5 DAYS  Final   Report Status 01/19/2015 FINAL  Final  C difficile quick scan w PCR reflex     Status: None   Collection Time: 01/15/15  5:18 AM  Result Value Ref Range Status   C Diff antigen NEGATIVE NEGATIVE Final   C Diff toxin NEGATIVE NEGATIVE Final   C Diff interpretation Negative for toxigenic C. difficile  Final    Medical History: Past Medical History  Diagnosis Date  . Hypertension   . Cardiac arrhythmia   . SVT (supraventricular tachycardia) ([place])      "today, last week, 2 wk ago; maybe 1 month ago, etc; started w/in last 3-4 yrs"(07/20/2012)  . Fainting     "~ 1 month ago; probably related to SVT" (07/20/2012)  . Shortness of breath     "related to SVT episodes" (07/20/2012)  . Chest pain at rest     "related to SVT" (07/20/2012)  . Lupus (systemic lupus erythematosus) ([place])   . GERD (gastroesophageal reflux disease)   . Fibromyalgia   . Irritable bowel syndrome (IBS)   . Hypercholesterolemia   . Heart murmur     "small" (12/25/2014)  . TIA (transient ischemic attack) 12/21/2014  . Sleep apnea     "don't wear mask anymore" (12/25/2014)  . Anemia   . History of blood transfusion "several"    "related to low counts"  . Daily headache   . Arthritis     "hips" (12/25/2014)  . Anxiety     "sometimes; don't take anything for it" (12/25/2014)  . ESRD (end stage renal disease) on dialysis ([place]) since 12/07/2014    "I've been doing it here on [facility]" (12/25/2014)  . 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  Readmitted for possible sepsis-pharmacy asked to restart Vancomycin and add Zosyn.  With ESRD - HD TTS  Pharmacy also dosing Coumadin for recent CVA  She was discharged on Vancomycin and has been receiving Vancomycin as an outpatient a her HD center (last dose 10/27 - Thursday), supposed to complete course 10/25  Goal of Therapy:  Appropriate dosing  Plan:  Zosyn 2.25 grams iv Q 8 hours Vancomycin 1 gram iv QHD Continue to follow  Thank you Anette Guarneri, PharmD [phone]   01/25/2015,10:26 AM

## 2015-01-25 NOTE — Progress Notes (Signed)
[facility]  Patient Status: Active (receiving services up to time of hospitalization)  [facility] is providing the following services: RN, PT and OT  Patient difficult to reach once at home.  Left multiple messages before contact made to [facility].    If patient discharges after hours, please call [phone].   Pietro Cassis Neave Lenger 01/25/2015, 2:06 PM

## 2015-01-25 NOTE — ED Notes (Signed)
Per EMS pt was Dialysised on yesterday and after release at pm pt has not being feeling well; Pt c/o of dizziness SOB and nausea; pt denies pain; Pt was in SVT in route and converted to HR at 132 prior to arrival; Pt has hx of SVT, lupus, and stroke three weeks ago; Pt has left sided weakness from stroke; Pt a&ox 4 on arrival;

## 2015-01-25 NOTE — H&P (Signed)
Date: 01/25/2015               Patient Name:  [name] MRN: [id number]  DOB: 09-27-1975 Age / Sex: 39 y.o., female   PCP: Lin Landsman, MD         Medical Service: Internal Medicine Teaching Service         Attending Physician: Dr. Lalla Brothers    First Contact: Dr. Gwenlyn Fudge, MD Pager: [phone]  Second Contact: Dr. 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 She had just completed a prolonged hospital course (9/5-10/8) and stay in inpatient rehab (10/10 - 10/20) for the aforementioned problems. While in inpatient rehab, she continued to spike fevers as high as 103. Potential sources of infection included a potential endocarditis, PICC site, but no definitive source was found. She was continued on Zosyn as an inpatient until 10/19 and received vancomycin IV from her dialysis center on [address] until 10/25. After discharge on 10/20, [name]'s mother said she has continued to have spiking fevers, as high as 103, chills, night sweats, left leg swelling and left leg graft pain. She also had a fall this morning due to left leg weakness, and she cannot get around without significant assistance. She reports that she continues to lie in bed all day. Today, she also endorses shortness of breath, non-radiating chest pain, nausea (no emesis), occasional loose non-bloody stool, diminished appetite. She denies any dysuria, but she has only been producing small amounts of urine, and she denies any dysuria. She has been able to complete most of her dialysis sessions, but had to cut one short because of "not  feeling well." According to her and her mother, she has not appreciably improved since discharge. Notably, she has not been taking her coumadin, which was prescribed for a valvular vegetation.  Meds: Current Facility-Administered Medications  Medication Dose Route Frequency Provider Last Rate Last Dose  . potassium chloride 10 mEq in 100 mL IVPB  10 mEq Intravenous Once Delora Fuel, MD 025 mL/hr at 01/25/15 0550 10 mEq at 01/25/15 0550   Current Outpatient Prescriptions  Medication Sig Dispense Refill  . atorvastatin (LIPITOR) 20 MG tablet Take 1 tablet (20 mg total) by mouth daily at 6 PM. 30 tablet 2  . Calcium Carbonate Antacid (TUMS PO) Take 1-2 tablets by mouth daily as needed (heartburn).    . cyclobenzaprine (FLEXERIL) 10 MG tablet Take 1 tablet (10 mg total) by mouth at bedtime as needed for muscle spasms. 30 tablet 0  . hydroxychloroquine (PLAQUENIL) 200 MG tablet Take 2 tablets (400 mg total) by mouth daily. 60 tablet 1  . isosorbide mononitrate (IMDUR) 30 MG 24 hr tablet Take 1 tablet (30 mg total) by mouth daily. 30 tablet 1  . metoprolol succinate (TOPROL-XL) 25 MG 24 hr tablet Take 1 tablet (25 mg total) by mouth at bedtime. 30 tablet 1  . multivitamin (RENA-VIT) TABS tablet Take 1 tablet by mouth at bedtime. 30 tablet 0  . oxyCODONE-acetaminophen (PERCOCET) 5-325 MG tablet Take 1 tablet by mouth every 6 (six) hours as needed  for severe pain. 90 tablet 0  . pantoprazole (PROTONIX) 20 MG tablet Take 1 tablet (20 mg total) by mouth daily. 30 tablet 1  . vancomycin (VANCOCIN) 1 GM/200ML SOLN Inject 200 mLs (1,000 mg total) into the vein Every Tuesday,Thursday,and Saturday with dialysis. 4000 mL     Allergies: Allergies as of 01/25/2015 - Review Complete 01/25/2015  Allergen Reaction Noted  . Food Swelling 08/29/2014   Past Medical History  Diagnosis Date  . Hypertension   . Cardiac arrhythmia   . SVT (supraventricular tachycardia) ([place])     "today, last week, 2 wk ago; maybe  1 month ago, etc; started w/in last 3-4 yrs"(07/20/2012)  . Fainting     "~ 1 month ago; probably related to SVT" (07/20/2012)  . Shortness of breath     "related to SVT episodes" (07/20/2012)  . Chest pain at rest     "related to SVT" (07/20/2012)  . Lupus (systemic lupus erythematosus) ([place])   . GERD (gastroesophageal reflux disease)   . Fibromyalgia   . Irritable bowel syndrome (IBS)   . Hypercholesterolemia   . Heart murmur     "small" (12/25/2014)  . TIA (transient ischemic attack) 12/21/2014  . Sleep apnea     "don't wear mask anymore" (12/25/2014)  . Anemia   . History of blood transfusion "several"    "related to low counts"  . Daily headache   . Arthritis     "hips" (12/25/2014)  . Anxiety     "sometimes; don't take anything for it" (12/25/2014)  . ESRD (end stage renal disease) on dialysis ([place]) since 12/07/2014    "I've been doing it here on [facility]" (12/25/2014)  . Chronic renal failure, stage 4 (severe) ([facility])   . Renal insufficiency   . Stroke [facility])    Past Surgical History  Procedure Laterality Date  . Cesarean section  2001; 2010  . Cervical biopsy  2013  . Supraventricular tachycardia ablation  07/21/12     Dr. Cristopher Peru   . Peripheral vascular catheterization N/A 12/14/2014    Procedure: Upper Extremity Venography;  Surgeon: Elam Dutch, MD;  Location: [place] [facility];  Service: Cardiovascular;  Laterality: N/A;  . Insertion hybrid anteriovenous gortex graft Left 12/18/2014    Procedure: INSERTION GORE HYBRID ARTERIOVENOUS GRAFT LEFT AXILLO-BRACHIAL.;  Surgeon: Serafina Mitchell, MD;  Location[facility];  Service: Vascular;  Laterality: Left;  . Ligation of arteriovenous  fistula Left 12/19/2014    Procedure: LIGATION OF FISTULA;  Surgeon: Elam Dutch, MD;  Location: [place];  Service: Vascular;  Laterality: Left;  . Appendectomy  2011  . Tubal ligation  2010  . Tee without cardioversion N/A 12/25/2014    Procedure: TRANSESOPHAGEAL ECHOCARDIOGRAM (TEE);  Surgeon:  Sueanne Margarita, MD;  Location: [place];  Service: Cardiovascular;  Laterality: N/A;  . Av fistula placement Left 01/04/2015    Procedure: INSERTION OF ARTERIOVENOUS (AV) GORE-TEX GRAFT THIGH;  Surgeon: Rosetta Posner, MD;  Location: [facility] OR;  Service: Vascular;  Laterality: Left;   Family History  Problem Relation Age of Onset  . Cancer Mother     breast and ovarian  . BRCA 1/2 Sister    Social History   Social History  . Marital Status: Single    Spouse Name: N/A  . Number of Children: N/A  . Years of Education: N/A   Occupational History  . Not on file.   Social History Main Topics  . Smoking status: Never Smoker   .  Smokeless tobacco: Never Used  . Alcohol Use: No     Comment: 07/20/2012 "might have a beer once/yr"  . Drug Use: No  . Sexual Activity: Not Currently    Birth Control/ Protection: None   Other Topics Concern  . Not on file   Social History Narrative    Review of Systems: Negative Except per HPI  Physical Exam: Blood pressure 119/54, pulse 133, temperature 98.7 F (37.1 C), temperature source Oral, resp. rate 30, last menstrual period 12/24/2014, SpO2 97 %. General: Mildly diaphoretic. Lying in bed, no acute distress. HEENT: Moist mucous membranes. No tonsillar erythema or exudates. EOMI. PERRL Cardiovascular: Tachycardic. Regular rhythm. Thrill heard at left graft site. 2+ DP Pulmonary: Bibasilar crackles. Normal respiratory effort  Abdominal: No tenderness, suprapubic or otherwise. Normal bowel sounds.  Extremities: Left leg with increased circumference compare to right. Legs warm and tender bilaterally. Skin: Diaphoretic. Left cath site c/d/i.  Neurological: 4/5 strength in all extremities. Slowed speech. Weak smile bilaterally with forehead sparing. Tongue midline. Sensation in tact in all extremities.   Psychiatric: Behavior and affect normal  Lab results: Basic Metabolic Panel:  Recent Labs  01/25/15 0405  NA 137  K 2.6*  CL 92*  CO2  28  GLUCOSE 65  BUN 10  CREATININE 5.32*  CALCIUM 7.8*   CBC:  Recent Labs  01/25/15 0405  WBC 16.2*  NEUTROABS 15.3*  HGB 8.9*  HCT 27.0*  MCV 91.8  PLT 187   Urine Drug Screen: Drugs of Abuse     Component Value Date/Time   LABOPIA NONE DETECTED 07/21/2012 0215   COCAINSCRNUR NONE DETECTED 07/21/2012 0215   LABBENZ NONE DETECTED 07/21/2012 0215   AMPHETMU NONE DETECTED 07/21/2012 0215   THCU NONE DETECTED 07/21/2012 0215   LABBARB NONE DETECTED 07/21/2012 0215    Urinalysis:  Recent Labs  01/25/15 0513  COLORURINE YELLOW  LABSPEC 1.033*  PHURINE 6.0  GLUCOSEU NEGATIVE  HGBUR LARGE*  BILIRUBINUR SMALL*  KETONESUR 15*  PROTEINUR >300*  UROBILINOGEN 0.2  NITRITE POSITIVE*  LEUKOCYTESUR MODERATE*     Imaging results:  Dg Chest Port 1 View  01/25/2015  CLINICAL DATA:  Acute onset of fever.  Initial encounter. EXAM: PORTABLE CHEST 1 VIEW COMPARISON:  Chest radiograph performed 01/11/2015 FINDINGS: The lungs are hypoexpanded. A small left pleural effusion is noted, with left basilar airspace opacity, which may reflect mild pneumonia. There is no evidence of pneumothorax. The cardiomediastinal silhouette is borderline enlarged. No acute osseous abnormalities are seen. IMPRESSION: Lungs hypoexpanded. Small left pleural effusion, with left basilar airspace opacity, which may reflect mild pneumonia. Borderline cardiomegaly. Electronically Signed   By: Garald Balding M.D.   On: 01/25/2015 04:16     Assessment & Plan by Problem:  Complicated Urinary Tract Infection in setting of immunosuppression: The most obvious source of her persistent fevers and leukocytosis  is a UTI given her positive nitrites, moderate leukocytes, and many bacteria on microscopy. While she denies symptoms of dysuria, her symptoms may be obfuscated by her ESRD. Given her immunosuppression, access sites, graft, left basilar opacity of CXR, previously demonstrated vegetation on echo, other sources  must also be considered if she does not improve after treating her UTI.  - Obtain culture of HD cath site. - After obtaining cultures, start of ceftazidine, which she previously responded to - Follow-up blood and urine cultures - Trending lactic acids  Lupus Nephritis on HD: Has been adherent to hydroxychloroquine HD sessions on [employer] (TTS) - Renal consult -  Hydroxychloroquine 400 mg  HTN: 119/54 at admission.  - Metoprolol succinate 78m PO - Continue PPI  Fibromyalgia:  - Oxycodone 5-325 mg q6h prn  GERD: Continue PPI  History of Valvular Vegetation: Warfarin per pharm.   Code Status: Full  Dispo: Disposition is deferred at this time, awaiting improvement of current medical problems. Anticipated discharge in approximately 7-10 day(s).   The patient does have a current PCP (Lin Landsman MD) and [facility] after [facility].  The patient does have transportation limitations that hinder transportation to clinic appointments.  Signed: JLiberty Handy MD 01/25/2015, 6:19 AM

## 2015-01-25 NOTE — Progress Notes (Signed)
[name] for heparin Indication: bridge with warfarin for endocarditis/stroke  Allergies  Allergen Reactions  . Food Swelling    Red peppers    Patient Measurements: Height: 5\' 3"  (160 cm) Weight: 197 lb 12 oz (89.7 kg) IBW/kg (Calculated) : 52.4 Heparin Dosing Weight: 73kg  Vital Signs: Temp: 99.7 F (37.6 C) (10/28 0709) Temp Source: Oral (10/28 0709) BP: 100/50 mmHg (10/28 0709) Pulse Rate: 115 (10/28 0709)  Labs:  Recent Labs  01/25/15 0405 01/25/15 0440  HGB 8.9*  --   HCT 27.0*  --   PLT 187  --   LABPROT  --  18.5*  INR  --  1.54*  CREATININE 5.32*  --     Estimated Creatinine Clearance: 15.1 mL/min (by C-G formula based on Cr of 5.32).   Assessment: 10 YOF with ESRD and recent CVA d/t endocarditis. She is on warfarin for embolic CVA with a goal INR of 2-2.5. Neurology ok with starting heparin as a bridge- spoke with Dr. Doy Mince and will use lower heparin goal and will not bolus.  INR today 1.54. Hgb 8.9, plts 187. No overt bleeding noted.  Goal of Therapy:  Heparin level 0.3-0.5 units/ml Monitor platelets by anticoagulation protocol: Yes   Plan:  -start heparin at 900 units/hr -first heparin level in 6 hours -daily HL and CBC -follow for s/s bleeding  Tanayia Wahlquist D. Filicia Scogin, PharmD, BCPS Clinical Pharmacist Pager: [phone] 01/25/2015 5:30 PM

## 2015-01-25 NOTE — Progress Notes (Signed)
MD Marvel Plan paged and at bedside, patient febrile, tachycardic, with tachypnea. Orders received patient to be transferred to stepdown once bed available.

## 2015-01-25 NOTE — Code Documentation (Signed)
39yo female admitted to [facility] today for fever and recent fall.  Patient with significant  PMH of lupus nephritis on HD and R MCA infarct.  Patient with left leg swelling and pain and sustained a fall d/t left leg weakness.  Patient reports she has been experiencing weakness for two weeks and uses a cain to ambulate.  Patient with reported increased weakness and code stroke activated.  Stroke team to the bedside.  Patient transported to CT, tolerated well, and transported back to 5W11.  NIHSS 5, see documentation for details and code stroke times.  No acute stroke treatment d/t unclear LKW.  Code stroke canceled. Bedside handoff with bedside RN.

## 2015-01-25 NOTE — Progress Notes (Signed)
  Echocardiogram 2D Echocardiogram has been performed.  [name] 01/25/2015, 1:05 PM

## 2015-01-25 NOTE — Progress Notes (Signed)
Subjective: [name] is a 39yo F with ESRD on TTS HD from lupus nephritis, culture negative endocarditis, R MCA stroke, recent pseudomonas UTI, as well as most recent hospitalization last week for fever of unknown origin admitted for possible sepsis. She says she has been spiking fevers particularly during her HD sessions, malaise, and lethargy. She was brought up to the floor at 7am this morning and was noted by nursing to have new lower extremity weakness, L worse than R as well as increased lethargy. A code stroke was called, neuro PA saw the patient, and a CT head non con was ordered as well as HD line cultures drawn and IV abx initiated.    Objective: Vital signs in last 24 hours: Filed Vitals:   01/25/15 0600 01/25/15 0601 01/25/15 0630 01/25/15 0709  BP: 119/54 119/54 103/48 100/50  Pulse: 135 133 122 115  Temp:  98.7 F (37.1 C)  99.7 F (37.6 C)  TempSrc:  Oral  Oral  Resp: 19 30 30 14   Height:    5\' 3"  (1.6 m)  Weight:    197 lb 12 oz (89.7 kg)  SpO2: 99% 97% 97% 99%   Weight change:   Intake/Output Summary (Last 24 hours) at 01/25/15 1457 Last data filed at 01/25/15 0641  Gross per 24 hour  Intake    500 ml  Output     25 ml  Net    475 ml   BP 100/50 mmHg  Pulse 115  Temp(Src) 99.7 F (37.6 C) (Oral)  Resp 14  Ht 5\' 3"  (1.6 m)  Wt 197 lb 12 oz (89.7 kg)  BMI 35.04 kg/m2  SpO2 99%  LMP 01/21/2015  General Appearance:    Alert, cooperative, no distress, appears stated age  Lungs:     Clear to auscultation bilaterally, respirations unlabored   Heart:    Regular rate and rhythm, S1 and S2 normal, no murmur, rub   or gallop  Abdomen:     Soft, non-tender, bowel sounds active all four quadrants,    no masses, no organomegaly  Extremities:   Extremities normal, atraumatic, no cyanosis or edema  Pulses:   2+ and symmetric all extremities  Skin:   Skin color, texture, turgor normal, no rashes or lesions  Lymph nodes:   Cervical, supraclavicular, and axillary  nodes normal  Neurologic:   Patient lethargic. A & O x 3 when awake but requires waking during interview multiple times. CNII-XII intact, further neuro exam shows decreased strength throughout 4/5 in upper and lower extremities. Bilat LE symmetrically weak with difficulty initiating lifting from table but able to hold legs up off bed once lifted,sensation and reflexes   throughout   Lab Results: Basic Metabolic Panel:  Recent Labs Lab 01/25/15 0405  NA 137  K 2.6*  CL 92*  CO2 28  GLUCOSE 65  BUN 10  CREATININE 5.32*  CALCIUM 7.8*   CBC:  Recent Labs Lab 01/25/15 0405  WBC 16.2*  NEUTROABS 15.3*  HGB 8.9*  HCT 27.0*  MCV 91.8  PLT 187  Coagulation:  Recent Labs Lab 01/25/15 0440  LABPROT 18.5*  INR 1.54*  Urinalysis:  Recent Labs Lab 01/25/15 0513  COLORURINE YELLOW  LABSPEC 1.033*  PHURINE 6.0  GLUCOSEU NEGATIVE  HGBUR LARGE*  BILIRUBINUR SMALL*  KETONESUR 15*  PROTEINUR >300*  UROBILINOGEN 0.2  NITRITE POSITIVE*  LEUKOCYTESUR MODERATE*   Studies/Results: Ct Head Wo Contrast  01/25/2015  CLINICAL DATA:  39 year old female with new onset left-sided  weakness. Code stroke. EXAM: CT HEAD WITHOUT CONTRAST TECHNIQUE: Contiguous axial images were obtained from the base of the skull through the vertex without intravenous contrast. COMPARISON:  12/21/2014 CT and MR FINDINGS: Right MCA/frontal infarct identified with similar distribution and size when compared to 12/21/2014. Calcification/clot in adjacent right MCA branch again noted. No new infarct, hemorrhage, extra-axial fluid collection, midline shift or hydrocephalus identified. No acute or suspicious bony abnormalities are identified. IMPRESSION: Evolutionary changes of subacute right MCA/frontal infarct. No definite evidence of extension but consider MR as clinically indicated. No evidence of new hemorrhage or infarct. Critical Value/emergent results were called by telephone at the time of interpretation on  01/25/2015 at 11:16 am to Dr. Doy Mince, who verbally acknowledged these results. Electronically Signed   By: Margarette Canada M.D.   On: 01/25/2015 11:18   Dg Chest Port 1 View  01/25/2015  CLINICAL DATA:  Acute onset of fever.  Initial encounter. EXAM: PORTABLE CHEST 1 VIEW COMPARISON:  Chest radiograph performed 01/11/2015 FINDINGS: The lungs are hypoexpanded. A small left pleural effusion is noted, with left basilar airspace opacity, which may reflect mild pneumonia. There is no evidence of pneumothorax. The cardiomediastinal silhouette is borderline enlarged. No acute osseous abnormalities are seen. IMPRESSION: Lungs hypoexpanded. Small left pleural effusion, with left basilar airspace opacity, which may reflect mild pneumonia. Borderline cardiomegaly. Electronically Signed   By: Garald Balding M.D.   On: 01/25/2015 04:16  Assessment/Plan: Active Problems:   Fever, unspecified   Sepsis ([place]) 1. Sepsis, source unknown - Most likely infectious source is HD line, tunneled cath in R femoral region which is at high risk for infection and more likely given her history of malaise and fevers brought on by HD sessions. Endocarditis is also possible source, however f/u echo near end of 4 week course of vanc that ended 10/25 showed resolution of mitral valve vegetation. With h/o recent pseudomonal UTI, must also consider as a possible source. Non-infectious causes remain less likely, but lupus flare is important consideration -Vanc and zosyn per pharmacy -F/u HD line Cx -Trend CBCs, BMP, lactate -Repeat TTE and BCx to e/f previous adequately-treated endocarditis -If these are negative, consider SLE activity labs  2. 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 Her weakness was initially thought to be L worse than R, particularly in  the lower extremities on 10/28. Head CT on 10/28 showed normal evolution of the R MCA infarct that was documented back in September. We believe these symptoms were residual symptoms exacerbated by her current septic state. -Warfarin per pharmacy -F/u INR -Appreciate neuro for following -Repeat MRI w/o con when stable  Dispo: Disposition is deferred at this time, awaiting improvement of current medical problems.    The patient does have a current PCP Lin Landsman, MD) and does need an [facility] hospital follow-up appointment after discharge.  The patient does have transportation limitations that hinder transportation to clinic appointments.   LOS: 0 days   Norval Gable, MD 01/25/2015, 2:57 PM

## 2015-01-25 NOTE — Progress Notes (Signed)
Per MD Evette Doffing at bedside, team will be calling a code stroke and consulting neurology. Will continue to monitor patient.

## 2015-01-25 NOTE — Progress Notes (Addendum)
Patient's HR sustaining in the 140s-150s. Temp 102.63F, BP 162/66 Respirations 34. Leads changed on telemetry. MD contacted and telephone order for a 12 lead EKG and stated they will come assess patient. Rapid response RN notified.   6:25 PM Rapid Response nurse at bedside.   1828: MD at bedside. Orders to move patient to step down unit.

## 2015-01-25 NOTE — Progress Notes (Addendum)
Despite receiving 1.5L of NS since arriving to the stepdown unit, Ms. Erdahl's BPs continued to be in the 80s/40s and lactic acid was from 2.3 to 2.7. From her HD cath site on the right leg, she had grown gram negative rods. Dr. Jacques Earthly and I went to the room to assess the patient.   Filed Vitals:   01/25/15 2330  BP: 77/43  Pulse: 121  Temp:   Resp: 26    I repeated a blood pressure upon arrival at 2350, and it was 59/29.  General: Tired appearing woman, lying in bed, mildly diaphoretic HEENT: Moise mucous membranes Cardiovascular: Tachycardic. Regular rhythm. No murmurs or extra heart sound Pulmonary: Clear to auscultation, unlabored breathing  Extremities: Increased leg circumference and edema on left versus right, unchanged from previous exam Neurological: AAOx3. Tired-appearing, 4/5 strength in the upper extremities, 4-/5 in the lower extremities bilaterally.   Assessment and Plan  Ms. Caligiuri blood pressures have failed to respond to fluid resuscitation with an increasing lactic acid and worsening hemodynamic status.   Septic Shock - 1L NS - Consult to PCCM to evaluate need for pressors

## 2015-01-25 NOTE — Progress Notes (Addendum)
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 Food Swelling    Red peppers    Vital Signs: Temp: 98.7 F (37.1 C) (10/28 0601) Temp Source: Oral (10/28 0601) BP: 119/54 mmHg (10/28 0601) Pulse Rate: 133 (10/28 0601)  Labs:  Recent Labs  01/25/15 0405  HGB 8.9*  HCT 27.0*  PLT 187  CREATININE 5.32*    Estimated Creatinine Clearance: 14.8 mL/min (by C-G formula based on Cr of 5.32).   Medical History: Past Medical History  Diagnosis Date  . Hypertension   . Cardiac arrhythmia   . SVT (supraventricular tachycardia) ([place])     "today, last week, 2 wk ago; maybe 1 month ago, etc; started w/in last 3-4 yrs"(07/20/2012)  . Fainting     "~ 1 month ago; probably related to SVT" (07/20/2012)  . Shortness of breath     "related to SVT episodes" (07/20/2012)  . Chest pain at rest     "related to SVT" (07/20/2012)  . Lupus (systemic lupus erythematosus) ([place])   . GERD (gastroesophageal reflux disease)   . Fibromyalgia   . Irritable bowel syndrome (IBS)   . Hypercholesterolemia   . Heart murmur     "small" (12/25/2014)  . TIA (transient ischemic attack) 12/21/2014  . Sleep apnea     "don't wear mask anymore" (12/25/2014)  . Anemia   . History of blood transfusion "several"    "related to low counts"  . Daily headache   . Arthritis     "hips" (12/25/2014)  . Anxiety     "sometimes; don't take anything for it" (12/25/2014)  . ESRD (end stage renal disease) on dialysis ([place]) since 12/07/2014    "I've been doing it here on [facility]" (12/25/2014)  . Chronic renal failure, stage 4 (severe) (HCC)   . Renal insufficiency   . Stroke [facility]    Assessment: 39 y/o F with recent discharge, warfarin had been held for several days before discharge due to supra-therapeutic INR's. Pt was supposed to have INR check on 10/24 and resume warfarin as the INR  allowed. Doesn't appear that patient has had any warfarin since discharge. No INR has been drawn yet.   Goal of Therapy:  INR 2-3 Monitor platelets by anticoagulation protocol: Yes   Plan:  -STAT INR  Narda Bonds 01/25/2015,6:09 AM  ============================== Addendum 6:58 AM INR is 1.54 It has taken 8 days for the INR to fall from 3.57>>1.54 with NO warfarin doses  -Will start extremely conservative warfarin dosing -Warfarin 0.5 mg PO x 1 at 1800 -Daily PT/INR -Monitor for bleeding Sharlynn Oliphant, PharmD

## 2015-01-25 NOTE — Progress Notes (Signed)
Patient ID: [name], female   DOB: [birth year], 39 y.o.   MRN: [id number]  I was paged by RN Frederico Hamman at [place] that [name] has spiked a fever to 102, was tachycardic to 140, hypotensive to 100/50, and tachypneic to 30. When we evaluated her, she was complaining of substernal chest pressure that had started earlier this afternoon; it had not worsened since her heart rate shot up, and she had no radiation or impending sense of doom. An EKG showed sinus tachycardia without ischemic changes and we began cycling troponins.  She's currently on vancomycin and piperacillin/tazobactam; we considered adding antifungal coverage given her immunosuppressed state but deferred as she has only been on antibiotics for one day. Blood cultures were drawn this morning in the emergency department. Her last lactic acid was 1.0 but we began re-trending them. We also bolused her with 1L D5 1/2 NS as she was hypotensive to 100/50.  Her IV access is tenuous right now and she cannot get a PICC line because she is a dialysis patient. If she should lose access tonight, an EJ or central line should be placed so she can continue getting antibiotics and fluids.  I question whether her clinical picture of sepsis is from her tunneled femoral catheter or perhaps from endocarditis. She is on broad-spectrum antibiotics regardless.  She was transferred to the step-down unit and we notified the night team of her tenuous status.  Loleta Chance, MD

## 2015-01-26 DIAGNOSIS — I63511 Cerebral infarction due to unspecified occlusion or stenosis of right middle cerebral artery: Secondary | ICD-10-CM

## 2015-01-26 DIAGNOSIS — I058 Other rheumatic mitral valve diseases: Secondary | ICD-10-CM

## 2015-01-26 DIAGNOSIS — R7881 Bacteremia: Secondary | ICD-10-CM

## 2015-01-26 DIAGNOSIS — A419 Sepsis, unspecified organism: Secondary | ICD-10-CM

## 2015-01-26 LAB — CBC
HCT: 22.2 % — ABNORMAL LOW (ref 36.0–46.0)
HCT: 22 % — ABNORMAL LOW (ref 36.0–46.0)
HEMOGLOBIN: 7.2 g/dL — AB (ref 12.0–15.0)
HEMOGLOBIN: 7 g/dL — AB (ref 12.0–15.0)
MCH: 30.1 pg (ref 26.0–34.0)
MCH: 29.5 pg (ref 26.0–34.0)
MCHC: 32.4 g/dL (ref 30.0–36.0)
MCHC: 31.8 g/dL (ref 30.0–36.0)
MCV: 92.9 fL (ref 78.0–100.0)
MCV: 92.8 fL (ref 78.0–100.0)
Platelets: 163 10*3/uL (ref 150–400)
Platelets: 182 10*3/uL (ref 150–400)
RBC: 2.39 MIL/uL — AB (ref 3.87–5.11)
RBC: 2.37 MIL/uL — AB (ref 3.87–5.11)
RDW: 18.7 % — ABNORMAL HIGH (ref 11.5–15.5)
RDW: 18.8 % — ABNORMAL HIGH (ref 11.5–15.5)
WBC: 12.3 10*3/uL — ABNORMAL HIGH (ref 4.0–10.5)
WBC: 13 10*3/uL — ABNORMAL HIGH (ref 4.0–10.5)

## 2015-01-26 LAB — IRON AND TIBC: IRON: 21 ug/dL — AB (ref 28–170)

## 2015-01-26 LAB — URINE CULTURE

## 2015-01-26 LAB — GLUCOSE, CAPILLARY: Glucose-Capillary: 78 mg/dL (ref 65–99)

## 2015-01-26 LAB — HEPARIN LEVEL (UNFRACTIONATED)
HEPARIN UNFRACTIONATED: 0.42 [IU]/mL (ref 0.30–0.70)
HEPARIN UNFRACTIONATED: 0.15 [IU]/mL — AB (ref 0.30–0.70)

## 2015-01-26 LAB — LACTIC ACID, PLASMA
LACTIC ACID, VENOUS: 1.1 mmol/L (ref 0.5–2.0)
Lactic Acid, Venous: 1 mmol/L (ref 0.5–2.0)

## 2015-01-26 LAB — PREPARE RBC (CROSSMATCH)

## 2015-01-26 LAB — RENAL FUNCTION PANEL
ALBUMIN: 1.4 g/dL — AB (ref 3.5–5.0)
ANION GAP: 11 (ref 5–15)
BUN: 9 mg/dL (ref 6–20)
CALCIUM: 6.8 mg/dL — AB (ref 8.9–10.3)
CO2: 24 mmol/L (ref 22–32)
Chloride: 103 mmol/L (ref 101–111)
Creatinine, Ser: 3.52 mg/dL — ABNORMAL HIGH (ref 0.44–1.00)
GFR calc Af Amer: 18 mL/min — ABNORMAL LOW (ref >60)
GFR, EST NON AFRICAN AMERICAN: 15 mL/min — AB (ref >60)
GLUCOSE: 76 mg/dL (ref 65–99)
PHOSPHORUS: 1.5 mg/dL — AB (ref 2.5–4.6)
Potassium: 4.2 mmol/L (ref 3.5–5.1)
SODIUM: 138 mmol/L (ref 135–145)

## 2015-01-26 LAB — PROTIME-INR
INR: 1.89 — ABNORMAL HIGH (ref 0.00–1.49)
INR: 2.04 — AB (ref 0.00–1.49)
PROTHROMBIN TIME: 21.6 s — AB (ref 11.6–15.2)
Prothrombin Time: 22.9 seconds — ABNORMAL HIGH (ref 11.6–15.2)

## 2015-01-26 LAB — TROPONIN I
TROPONIN I: 0.18 ng/mL — AB (ref <0.031)
TROPONIN I: 0.29 ng/mL — AB (ref <0.031)

## 2015-01-26 LAB — FERRITIN: FERRITIN: 1323 ng/mL — AB (ref 11–307)

## 2015-01-26 MED ORDER — VANCOMYCIN HCL IN DEXTROSE 1-5 GM/200ML-% IV SOLN: 1000.0000 mg | INTRAVENOUS | Status: DC

## 2015-01-26 MED ORDER — SODIUM CHLORIDE 0.9 % IV SOLN
500.0000 mg | INTRAVENOUS | Status: AC
  Administered 2015-01-26: 500 mg via INTRAVENOUS
  Filled 2015-01-26: qty 0.5

## 2015-01-26 MED ORDER — SODIUM CHLORIDE 0.9 % IV SOLN: 250.0000 mL | INTRAVENOUS | Status: DC | PRN

## 2015-01-26 MED ORDER — DARBEPOETIN ALFA 200 MCG/0.4ML IJ SOSY
200.0000 ug | PREFILLED_SYRINGE | INTRAMUSCULAR | Status: DC
  Filled 2015-01-26: qty 0.4

## 2015-01-26 MED ORDER — CYCLOBENZAPRINE HCL 10 MG PO TABS: 10.0000 mg | ORAL_TABLET | Freq: Every evening | ORAL | Status: DC | PRN

## 2015-01-26 MED ORDER — ATROPINE SULFATE 0.1 MG/ML IJ SOLN
INTRAMUSCULAR | Status: AC
  Filled 2015-01-26: qty 10

## 2015-01-26 MED ORDER — ACETAMINOPHEN 325 MG PO TABS
ORAL_TABLET | ORAL | Status: AC
Start: 2015-01-26 — End: 2015-01-26
  Filled 2015-01-26: qty 2

## 2015-01-26 MED ORDER — SODIUM CHLORIDE 0.9 % IV SOLN
500.0000 mg | INTRAVENOUS | Status: DC
  Administered 2015-01-26 – 2015-01-27 (×2): 500 mg via INTRAVENOUS
  Filled 2015-01-26 (×2): qty 0.5

## 2015-01-26 MED ORDER — SODIUM CHLORIDE 0.9 % IV SOLN: Freq: Once | INTRAVENOUS | Status: DC

## 2015-01-26 MED ORDER — LEVOFLOXACIN IN D5W 500 MG/100ML IV SOLN: 500.0000 mg | INTRAVENOUS | Status: DC

## 2015-01-26 MED ORDER — SODIUM CHLORIDE 0.9 % IV BOLUS (SEPSIS)
1000.0000 mL | Freq: Once | INTRAVENOUS | Status: AC
  Administered 2015-01-26: 1000 mL via INTRAVENOUS

## 2015-01-26 MED ORDER — LEVOFLOXACIN IN D5W 750 MG/150ML IV SOLN
750.0000 mg | INTRAVENOUS | Status: AC
  Administered 2015-01-26: 750 mg via INTRAVENOUS
  Filled 2015-01-26: qty 150

## 2015-01-26 NOTE — Procedures (Addendum)
I have personally attended this patient's dialysis session.   HD via right sided TDC BFR   BP not tolerating HD, tachy now to 174 [place]  Catheter will be removed after HD Lab is pending On 4K bath  With current HR 170's, inability to obtain an accurate BP - too unstable for HD at this time Rinse back, return to ICU - reassess later in the day for HD.  Did not get her PRBC's in HD due to instability  Jamal Maes, MD [place] Pager 01/26/2015, 10:38 AM

## 2015-01-26 NOTE — Treatment Plan (Addendum)
PULMONARY / CRITICAL CARE MEDICINE TRANSFER NOTE   Name: Muslima Toppins MRN: [id number] DOB: [birth year]    ADMISSION DATE:  01/25/2015  CHIEF COMPLAINT:  Septic shock  INITIAL PRESENTATION: Sepsis with GNR bacteremia  SIGNIFICANT EVENTS: 01/25/2015- Worsening shock with hypotension, tachycardia and lactatemia despite aggressive fluid resuscitation  01/26/2015 - Transfer to 2H ICU   HISTORY OF PRESENT ILLNESS:   39AAF with h/o ESRD on TTS HD from tunneled line, lupus nephritis, lupus anticoagulant and prior right MCA CVA who presented with fevers to 103.  She was started on vanco/zosyn with persistent fevers and progressive hypotension, tachycardia and rising lactate (shock).    PAST MEDICAL HISTORY :   has a past medical history of Hypertension; Cardiac arrhythmia; SVT (supraventricular tachycardia) ([place]); Fainting; Shortness of breath; Chest pain at rest; Lupus (systemic lupus erythematosus) ([place]); GERD (gastroesophageal reflux disease); Fibromyalgia; Irritable bowel syndrome (IBS); Hypercholesterolemia; Heart murmur; TIA (transient ischemic attack) (12/21/2014); Sleep apnea; Anemia; History of blood transfusion ("several"); Daily headache; Arthritis; Anxiety; ESRD (end stage renal disease) on dialysis ([place]) (since 12/07/2014); and Stroke [facility]).  has past surgical history that includes Cesarean section (2001; 2010); Cervical biopsy (2013); Supraventricular tachycardia ablation (07/21/12 ); Cardiac catheterization (N/A, 12/14/2014); Insertion hybrid anteriovenous gortex graft (Left, 12/18/2014); Ligation of arteriovenous  fistula (Left, 12/19/2014); Appendectomy (2011); Tubal ligation (2010); TEE without cardioversion (N/A, 12/25/2014); and AV fistula placement (Left, 01/04/2015). Prior to Admission medications   Medication Sig Start Date End Date Taking? Authorizing Provider  atorvastatin (LIPITOR) 20 MG tablet Take 1 tablet (20 mg total) by mouth daily at 6 PM. 01/17/15  Yes Lavon Paganini Angiulli, PA-C   Calcium Carbonate Antacid (TUMS PO) Take 1-2 tablets by mouth daily as needed (heartburn).   Yes Historical Provider, MD  cyclobenzaprine (FLEXERIL) 10 MG tablet Take 1 tablet (10 mg total) by mouth at bedtime as needed for muscle spasms. 01/17/15  Yes Reighlynn Swiney J Angiulli, PA-C  hydroxychloroquine (PLAQUENIL) 200 MG tablet Take 2 tablets (400 mg total) by mouth daily. 01/17/15  Yes Radford Pease J Angiulli, PA-C  isosorbide mononitrate (IMDUR) 30 MG 24 hr tablet Take 1 tablet (30 mg total) by mouth daily. 01/17/15  Yes Janayia Burggraf J Angiulli, PA-C  metoprolol succinate (TOPROL-XL) 25 MG 24 hr tablet Take 1 tablet (25 mg total) by mouth at bedtime. 01/17/15  Yes Dickey Caamano J Angiulli, PA-C  multivitamin (RENA-VIT) TABS tablet Take 1 tablet by mouth at bedtime. 01/17/15  Yes Katyana Trolinger J Angiulli, PA-C  oxyCODONE-acetaminophen (PERCOCET) 5-325 MG tablet Take 1 tablet by mouth every 6 (six) hours as needed for severe pain. 01/17/15  Yes Shawnta Schlegel J Angiulli, PA-C  pantoprazole (PROTONIX) 20 MG tablet Take 1 tablet (20 mg total) by mouth daily. 01/17/15  Yes Aleina Burgio J Angiulli, PA-C  vancomycin (VANCOCIN) 1 GM/200ML SOLN Inject 200 mLs (1,000 mg total) into the vein Every Tuesday,Thursday,and Saturday with dialysis. 01/05/15  Yes Zada Finders, MD   Allergies  Allergen Reactions  . Food Swelling    Red peppers    FAMILY HISTORY:  has no family status information on file.  SOCIAL HISTORY:  reports that she has never smoked. She has never used smokeless tobacco. She reports that she does not drink alcohol or use illicit drugs.  REVIEW OF SYSTEMS:  No nausea, vomiting, diarrhea, no muscle pain or cramping.  Denies fevers.  SUBJECTIVE:   VITAL SIGNS: Temp:  [98.7 F (37.1 C)-102.9 F (39.4 C)] 99 F (37.2 C) (10/28 2319) Pulse Rate:  [115-143] 121 (10/28 2330) Resp:  [3-34] 26 (  10/28 2330) BP: (77-162)/(40-73) 77/43 mmHg (10/28 2330) SpO2:  [94 %-100 %] 98 % (10/28 2330) Weight:  [89.7 kg (197 lb 12 oz)] 89.7 kg  (197 lb 12 oz) (10/28 0709) HEMODYNAMICS:   VENTILATOR SETTINGS:   INTAKE / OUTPUT:  Intake/Output Summary (Last 24 hours) at 01/26/15 0037 Last data filed at 01/25/15 1547  Gross per 24 hour  Intake    500 ml  Output    225 ml  Net    275 ml    PHYSICAL EXAMINATION: General:  NAD, AAOx3  Neuro:  Left arm decreased strength and mobility HEENT:  PERLA, MM dry Cardiovascular:  Tachycardic, regular, no m/r/g Lungs:  CTA b/l no w/r/r Abdomen:  Soft, non tender, normal bowel sounds Musculoskeletal:  Left hand contracture Skin:  Left thigh swollen, erythemetous, indurated. Extremities: Left leg with 1+ pitting edema, warm, indurated thigh with erythema. Right sided femoral vas-cath  LABS:  CBC  Recent Labs Lab 01/25/15 0405  WBC 16.2*  HGB 8.9*  HCT 27.0*  PLT 187   Coag's  Recent Labs Lab 01/25/15 0440  INR 1.54*   BMET  Recent Labs Lab 01/25/15 0405 01/25/15 1918 01/25/15 2219  NA 137 138 136  K 2.6* 3.5 2.8*  CL 92* 95* 98*  CO2 28 26 26   BUN 10 15 14   CREATININE 5.32* 5.77* 5.54*  GLUCOSE 65 65 115*   Electrolytes  Recent Labs Lab 01/25/15 0405 01/25/15 1918 01/25/15 2219  CALCIUM 7.8* 7.3* 6.8*  PHOS  --   --  2.0*   Sepsis Markers  Recent Labs Lab 01/25/15 1206 01/25/15 1918 01/25/15 2219  LATICACIDVEN 1.2 2.3* 2.7*   ABG  Recent Labs Lab 01/25/15 1040  PHART 7.476*  PCO2ART 38.4  PO2ART 77.8*   Liver Enzymes  Recent Labs Lab 01/25/15 2219  ALBUMIN 1.6*   Cardiac Enzymes  Recent Labs Lab 01/25/15 1918  TROPONINI 0.21*   Glucose  Recent Labs Lab 01/25/15 1823 01/25/15 1951 01/25/15 2032 01/25/15 2322  GLUCAP 50* 54* 101* 84    Imaging Ct Head Wo Contrast  01/25/2015  CLINICAL DATA:  39 year old female with new onset left-sided weakness. Code stroke. EXAM: CT HEAD WITHOUT CONTRAST TECHNIQUE: Contiguous axial images were obtained from the base of the skull through the vertex without intravenous contrast.  COMPARISON:  12/21/2014 CT and MR FINDINGS: Right MCA/frontal infarct identified with similar distribution and size when compared to 12/21/2014. Calcification/clot in adjacent right MCA branch again noted. No new infarct, hemorrhage, extra-axial fluid collection, midline shift or hydrocephalus identified. No acute or suspicious bony abnormalities are identified. IMPRESSION: Evolutionary changes of subacute right MCA/frontal infarct. No definite evidence of extension but consider MR as clinically indicated. No evidence of new hemorrhage or infarct. Critical Value/emergent results were called by telephone at the time of interpretation on 01/25/2015 at 11:16 am to Dr. Doy Mince, who verbally acknowledged these results. Electronically Signed   By: Margarette Canada M.D.   On: 01/25/2015 11:18   Mr Brain Wo Contrast  01/25/2015  CLINICAL DATA:  Left leg weakness. Fevers. Possible sepsis. Recent infarct. EXAM: MRI HEAD WITHOUT CONTRAST TECHNIQUE: Multiplanar, multiecho pulse sequences of the brain and surrounding structures were obtained without intravenous contrast. COMPARISON:  CT head without contrast 01/25/2015. MRI brain 12/21/2014 FINDINGS: The diffusion-weighted images demonstrate some expansion of the anterior right MCA territory infarct along the medial edge. There is also a new restricted diffusion within the right caudate head and lentiform nucleus. There is no associated hemorrhage. T2 changes  have slightly progressed as well. No acute left-sided infarcts are present. There is no significant left-sided white matter disease. Flow is present in the major intracranial arteries. The brainstem and cerebellum are within normal limits. Mucosal disease is evident along the floor of the maxillary sinuses bilaterally and partial opacification of the right sphenoid sinus is again noted. Bilateral mastoid effusions are present. No obstructing at nasopharyngeal lesion is evident. IMPRESSION: 1. Medial to expansion of the  anterior right MCA territory infarct. 2. Restricted diffusion involving the right caudate head and lentiform nucleus compatible with acute ischemic changes in the basal ganglia as well. 3. Stable sinus disease. Electronically Signed   By: San Morelle M.D.   On: 01/25/2015 15:51   Dg Chest Port 1 View  01/25/2015  CLINICAL DATA:  Acute onset of fever.  Initial encounter. EXAM: PORTABLE CHEST 1 VIEW COMPARISON:  Chest radiograph performed 01/11/2015 FINDINGS: The lungs are hypoexpanded. A small left pleural effusion is noted, with left basilar airspace opacity, which may reflect mild pneumonia. There is no evidence of pneumothorax. The cardiomediastinal silhouette is borderline enlarged. No acute osseous abnormalities are seen. IMPRESSION: Lungs hypoexpanded. Small left pleural effusion, with left basilar airspace opacity, which may reflect mild pneumonia. Borderline cardiomegaly. Electronically Signed   By: Garald Balding M.D.   On: 01/25/2015 04:16     ASSESSMENT / PLAN:  65F with ESRD admitted with sepsis from GNR bacteremia, now progressed to septic shock.    CARDIOVASCULAR A: s/p 1.5L volume resuscitation without improvement.  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  At this time she is hypotensive but signs of perfusion are adequate and MAP is 66.  Her lactate is mildly elevated but she is an anuric renal failure patient.  Trying to avoid placing a CVC into an actively bacteremic patient. P:   - Moved to ICU  - 1L NS now to complete 30cc/kg volume resucitation  - antibiotic changes as noted below.  - low dose peripheral levophed if needed  - CVC if needed  - Right heart cath after sepsis is resolved - evaluation for PAH meds in the future, currently contraindicated  given septic shock.  RENAL A:   Likely too hypotensive for HD now.   P:    - Vas-cath is cuffed and tunneled will need radiology to d/c tomorrow  - initiate CRRT per  nephrology  - no emergent need for RRT now  HEMATOLOGIC A:  Lupus anticoagulant with known CVA.  Recent exacerbation of neurologic symptoms with CTA non-con negative for hemorrhagic phenomena.  On heparin gtt with warfarin dosed per pharmacy P:   - Currently on heparin gtt  - followed by heparin levels  - check level now  - recheck INR now  INFECTIOUS A:  GNR bacteremia, multiple potential sources including, tunneled HD line (69 days old), endocarditis. P:   BCx2 01/25/15 - GNR  UC NGTD Abx:   - change zosyn to meropenem and double cover pseudomonas with levofloxacin   - d/c right femoral vas-cath  - trying to avoid placement of CVC given active bacteremia, however will do if needed.  - cont vanc for now dose by level  - daily blood cultures  - AM consult to cardiology for TEE   NEUROLOGIC A:  Right sided CVA likely from lupus anticoagulant.  Symptoms currently stable. P:    - Holding heparin gtt for now given possibility of CVC placement  - resume heparin after  line  - warfarin per pharmacy dosing strategy  FAMILY  - Updates: discussed plan of care with patient at the bedside  Total critical care time: 45 min  Critical care time was exclusive of separately billable procedures and treating other patients.  Critical care was necessary to treat or prevent imminent or life-threatening deterioration.  Critical care was time spent personally by me on the following activities: development of treatment plan with patient and/or surrogate as well as nursing, discussions with consultants, evaluation of patient's response to treatment, examination of patient, obtaining history from patient or surrogate, ordering and performing treatments and interventions, ordering and review of laboratory studies, ordering and review of radiographic studies, pulse oximetry and re-evaluation of patient's condition.   Meribeth Mattes, DO., MS [place] [facility]    Pulmonary and [place] Pager: [phone]  01/26/2015, 12:37 AM

## 2015-01-26 NOTE — Progress Notes (Signed)
PCCM Interval Note  Case reviewed and patient interviewed and examined.   She is no longer febrile. Abx changed to meropenem + levaquin + vanco last night.  Blood cx with GNR R femoral tunneled catheter remains in place She is on heparin for recent CVA in setting of lupus anticoagulant.   Filed Vitals:   01/26/15 0800  BP: 128/78  Pulse: 106  Temp:   Resp: 16   Gen: Pleasant, obese, weak, in no distress,  normal affect  ENT: No lesions,  mouth clear,  oropharynx clear, no postnasal drip  Neck: No JVD, no TMG, no carotid bruits  Lungs: No use of accessory muscles, no dullness to percussion, clear without rales or rhonchi  Cardiovascular: RRR, R femoral HD catheter, L thigh warm  Musculoskeletal: No deformities, no cyanosis or clubbing  Neuro: alert, non focal  Skin: Warm, no lesions or rashes   Recent Labs Lab 01/25/15 0405 01/26/15 0109 01/26/15 0723  HGB 8.9* 7.2* 7.0*  HCT 27.0* 22.2* 22.0*  WBC 16.2* 12.3* 13.0*  PLT 187 163 182    Recent Labs Lab 01/25/15 0405 01/25/15 1918 01/25/15 2219  NA 137 138 136  K 2.6* 3.5 2.8*  CL 92* 95* 98*  CO2 28 26 26   GLUCOSE 65 65 115*  BUN 10 15 14   CREATININE 5.32* 5.77* 5.54*  CALCIUM 7.8* 7.3* 6.8*  PHOS  --   --  2.0*     Plans:  Continue heparin for now, hold coumadin in event procedures indicated. Decision to d/c R femoral HD cath is complicated as she is end-stage access. Will need to discuss with nephrology, defer to them regarding d/c versus treat through the catheter for now Broad abx with double GNR coverage; d/c vanco given resolution of her mitral valve vegetation on TTE from 10/25 (treated for 4 weeks)  Additional Independent CC time 35 minutes   Baltazar Apo, MD, PhD 01/26/2015, 8:36 AM [place] [facility] [phone] or if no answer [phone]

## 2015-01-26 NOTE — Progress Notes (Signed)
Called report to [facility] on 2H. Patient transferred to 2H16 via bed with heart monitor in place. Mother walked with nursing staff during transportation with patient's belongings. Transferring nursing staff helped settle patient into new room. All questions from accepting nursing staff answered.

## 2015-01-26 NOTE — Progress Notes (Signed)
STROKE TEAM PROGRESS NOTE   HISTORY [name] is an 39 y.o. female with history of R MCA CVA, HTN, She had just completed a prolonged hospital course (9/5-10/8) and stay in inpatient rehab (10/10 - 10/20). While in inpatient rehab, she continued to spike fevers as high as 103. At the time of admission, the potential sources of infection included a potential endocarditis, PICC site, but no definitive source was found. She was continued on Zosyn as an inpatient until 10/19 and received vancomycin IV from her dialysis center on [address] until 10/25. After discharge on 10/20, [name]'s mother said she has continued to have spiking fevers, as high as 103, chills, night sweats, left leg swelling and left leg graft pain and this am fell due to pain in left leg and weakness. She has recently been taken off her Coumadin which she was taking for valvular vegetation. Today she returned to hospital due to to not feeling well and increased pain and swelling in her left leg. Patient is being admitted for work up of sepsis. While on the floor residents noted she seemed to be weaker on the left leg. Code stroke was called. On arrival patient was complaining of left leg pain and stated she was not moving due to pain. Left leg is warm and swollen. In talking with the patient she states her left leg has been weak for 2 weeks since surgery. It has been unchanged.  At the time of initial eval. her left leg was felt to be weak but unchanged.   Date last known well: Unable to determine Time last known well: Unable to determine tPA Given: No: no clear LSN, PT elevated, recent infarct   SUBJECTIVE (INTERVAL HISTORY) Her RN and clinical pharmacist were at the bedside.  Overall she feels her condition is gradually worsening. She complains of N/V and chest pain.  She apparently became acutely tachycardic, nauseated and febrile whil in HD.  HD was discontinued and patient was brought to her room   OBJECTIVE  Temp:   [97.5 F (36.4 C)-102.9 F (39.4 C)] 101.9 F (38.8 C) (10/29 1133) Pulse Rate:  [104-150] 147 (10/29 1200) Cardiac Rhythm:  [-] Sinus tachycardia (10/29 0900) Resp:  [0-34] 31 (10/29 1200) BP: (77-171)/(40-111) 143/78 mmHg (10/29 1200) SpO2:  [94 %-100 %] 99 % (10/29 1200) Weight:  [93.713 kg (206 lb 9.6 oz)] 93.713 kg (206 lb 9.6 oz) (10/29 0308)  CBC:   Recent Labs Lab 01/25/15 0405 01/26/15 0109 01/26/15 0723  WBC 16.2* 12.3* 13.0*  NEUTROABS 15.3*  --   --   HGB 8.9* 7.2* 7.0*  HCT 27.0* 22.2* 22.0*  MCV 91.8 92.9 92.8  PLT 187 163 329    Basic Metabolic Panel:   Recent Labs Lab 01/25/15 2219 01/26/15 1051  NA 136 138  K 2.8* 4.2  CL 98* 103  CO2 26 24  GLUCOSE 115* 76  BUN 14 9  CREATININE 5.54* 3.52*  CALCIUM 6.8* 6.8*  PHOS 2.0* 1.5*    Lipid Panel:     Component Value Date/Time   CHOL 113 12/21/2014 1638   TRIG 107 12/21/2014 1638   HDL 53 12/21/2014 1638   CHOLHDL 2.1 12/21/2014 1638   VLDL 21 12/21/2014 1638   LDLCALC 39 12/21/2014 1638   HgbA1c:  Lab Results  Component Value Date   HGBA1C 6.1* 12/22/2014   Urine Drug Screen:     Component Value Date/Time   LABOPIA NONE DETECTED 07/21/2012 0215   COCAINSCRNUR NONE DETECTED  07/21/2012 0215   LABBENZ NONE DETECTED 07/21/2012 0215   AMPHETMU NONE DETECTED 07/21/2012 0215   THCU NONE DETECTED 07/21/2012 0215   LABBARB NONE DETECTED 07/21/2012 0215      IMAGING  Ct Head Wo Contrast 01/25/2015   Evolutionary changes of subacute right MCA/frontal infarct. No definite evidence of extension but consider MR as clinically indicated. No evidence of new hemorrhage or infarct.  Mr Brain Wo Contrast 01/25/2015   1. Medial to expansion of the anterior right MCA territory infarct.  2. Restricted diffusion involving the right caudate head and lentiform nucleus compatible with acute ischemic changes in the basal ganglia as well.  3. Stable sinus disease.  Dg Chest Port 1 View 01/25/2015    Lungs hypoexpanded. Small left pleural effusion, with left basilar airspace opacity, which may reflect mild pneumonia. Borderline cardiomegaly.    PHYSICAL EXAM HEENT- Normocephalic, no lesions, without obvious abnormality. Normal external eye and conjunctiva slightly injected.Normal external nose, mucus membranes and septum. Normal pharynx. Cardiovascular- tachycardic  Lungs- chest clear, no wheezing, rales, normal symmetric air entry Abdomen- normal findings: bowel sounds normal Extremities- positive findings: swelling in left LE Musculoskeletal-no joint tenderness, deformity or swelling Skin-warm and dry   Neurological Examination Mental Status: Alert, oriented, thought content appropriate. Speech fluent without evidence of aphasia. Able to follow 3 step commands without difficulty.  Cranial Nerves: II:  Visual fields grossly normal, pupils equal, round, reactive to light and accommodation III,IV, VI: ptosis not present, extra-ocular motions intact bilaterally V,VII: smile symmetric, facial light touch sensation normal bilaterally VIII: hearing normal bilaterally IX,X: uvula rises symmetrically XI: bilateral shoulder shrug XII: midline tongue extension  Motor:  Exam limited by patient's nausea and malaise; with best effort Right :Upper extremity 5/5Left: Upper extremity 5/5 Lower extremity 4/5Lower extremity 4/5 Both legs have drift.   Tone and bulk:normal tone throughout; no atrophy noted Sensory: light touch intact throughout, bilaterally Plantars: Right: downgoingLeft: downgoing Cerebellar: Deferred due to patient's nausea Gait: not tested   ASSESSMENT/PLAN [name] is a 39 y.o. female with history of right middle cerebral artery CVA, hypertension, Coumadin therapy for valvular vegetation, end-stage renal  disease on dialysis, hypertension, supraventricular tachycardia, systemic lupus, fibromyalgia, hyperlipidemia, and previous TIA, recent fevers of unknown etiology presenting with left lower extremity weakness pain and swelling.  She did not receive IV t-PA due to unknown time of onset and recent stroke.   Stroke:  Non-dominant anterior right MCA territory infarct probably embolic from valvular vegetation.  Resultant  Malaise and lethargy  MRI - Medial to expansion of the anterior right MCA territory infarct.   MRA - 12/21/2014 - Right MCA anterior division M2 branch occlusion with associated patchy right MCA infarct.  Carotid Doppler - See CTA  CTA of neck 12/24/2014 - No extracranial cerebral vascular disease is evident.  Bilateral lower extremity duplex - 01/14/2015 - negative for DVT  2D Echo - 01/25/2015 - EF 60-65%. No cardiac source of emboli identified.  TEE - 12/25/2014 - Shaggy mobile density that emenates off the lateral aspect of the posterior mitral valve leaflet c/w vegetation.   LDL - 12/21/2014 - 39  HgbA1c - Pending  VTE prophylaxis - IV heparin / warfarin per pharmacy protocol Diet 2 gram sodium Room service appropriate?: Yes; Fluid consistency:: Thin; Fluid restriction:: 1200 mL Fluid  No antithrombotic prior to admission, now on heparin IV and warfarin per pharmacy protocol  (see discussion below)  Ongoing aggressive stroke risk factor management  Therapy recommendations:- Pending  Disposition:  Pending  Hypertension  Stable  Permissive hypertension (OK if < 220/120) but gradually normalize in 5-7 days  Hyperlipidemia  Home meds:  Lipitor 20 mg daily resumed in hospital  LDL 39, goal < 70  Continue statin at discharge   Other Stroke Risk Factors  Obesity, Body mass index is 36.61 kg/(m^2).   Endocarditis  Obstructive sleep apnea, on CPAP at home   Hospital day # 1   Attending System Review  Neuro  Embolic stroke likely due to murantic  emboli given history of bacterial endocarditis and new positive blood cultures.  Current ECHO does not show vegetations; previous ECHO was a TEE  May wish to repeat TEE  Discussed case with Dr. Malvin Johns.  Now that blood culture continue to result with positive findings the risk for hemorrhagic rtransformation of cerebral emboli appears higher as they are likely due to infectious endocarditis.  We agreee to discontinue anticoagulation during the acute infection period.  Recommend [place] and CHAD-Vasc scores compared to HAS-BLED scores at teh time long-term secondary prevention is selected in the future  Cardiac  Primary team managing chest pain and tachycardia  Pulmonary  Recommend CXR in the AM  GI  N/V; NPO for now  GU   Nephrology following  Endo  If not done, recommend TSH  Heme / ID  Agree with broad spectrum antibiotics to cover gram negative results  Electrolytes  Hypokalemia improved.  Recommend treating phos and checking magnesium to address possible cardiac irritability     To contact Stroke Continuity provider, please refer to [email or web address] After hours, contact General Neurology

## 2015-01-26 NOTE — Progress Notes (Signed)
CRITICAL VALUE ALERT  Critical value received:  Gram - rods From [facility]  Date of notification:  01/26/15  Time of notification:  6503  Critical value read back:Yes.    Nurse who received alert:  Tanda Rockers  MD notified (1st page):  byrum   Time of first page:  1350  MD notified (2nd page):  Time of second page:  Responding MD:  byrum  Time MD responded:  1352

## 2015-01-26 NOTE — Procedures (Signed)
Successful removal of tunneled hemodialysis catheter from right groin.  Tip sent for culture.  Laddie Math S Dametrius Sanjuan PA-C 01/26/2015 1:22 PM

## 2015-01-26 NOTE — Progress Notes (Signed)
[facility] Progress Note  Assessment/Plan:  Events overnight - spiked temp to 102.9, tachy, hypotensive to the 70s - given IVF 1.6 L transferred to [place]. Antibiotics broadened for gram neg coverage . BP stable this am, afebrile, alert. sats 100% on room air.  1. Subacute right MCA/evolutionary changes repeat MRI 10/28 showed medial expansion of the anterior right MCA infarct and acute ischemic change in the basal ganglia; Neuro following 2. GNR sepsis- most likely source fem cath and or urine (culture in progress)- needs catheter out today AFTER HD- will arrange.  Antibiotics now are Levaquin and Merrem; ? Vanc stopped 3. ESRD with hypokalemia K 2.6 - K given;(TTS) suspect low due to poor intake- HD today - titrate EDW down; start on 4 K bath, check labs and re-eval; graft should be ready to use 11/3- seems to be healing well--given + BC and also significant L leg swelling- persistent since last admission- had prior neg venous dopples,  VVS may need to evaluate 4. BP/volume -marked hypotension yesterday - into 80s = received 1.6 L IVF; BP stable this am, didn't get MTP last pm due to low BP - try for some volume removal as BP allows - if stable can ^ goal 5. Anemia Hgb 8.9> 7.2 to 7 - ?? Dilutional vs loss vs infx - check hemocult - continue max ESA Aranesp 200 tsat 37% ferritin 139 9/17; transfuse 1 unit PRBC on HD today 6. Metabolic bone disease- not on VDRA, iPTH 71 9/13, not requiring binders 7. Nutrition- given low K, have elected to put on 2 gm Na for now 8. Hx SVT - on BB 9. Lupus - on Plaquenil 10. 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 Lin Landsman after d/c, but not taking INR 1.54  Myriam Jacobson, PA-C [facility] Beeper [phone] 01/26/2015,9:21 AM  LOS: 1 day    I have seen and examined this patient and agree with plan and assessment in the above note. Pt admitted  with fevers, has GNR in blood cultures from admission, receiving levaquin and meropenem. L AVG looks OK, urine culture pending, using right thigh TDC for HD today - will make arrangements for removal of this catheter post HD and use AVG for next HD. On heparin for stroke.  MUCH more alert today but with little recollection of events past 48 hours.  Leiana Rund B,MD 01/26/2015 10:29 AM  Subjective:   Does not remember me seeing or talking to me yesterday Does not remember seeing me either - says has little recollection of the events of the past 2-3 days  Objective Filed Vitals:   01/26/15 0700 01/26/15 0734 01/26/15 0800 01/26/15 0900  BP: 139/75 131/81 128/78 143/69  Pulse:  107 106 107  Temp:  97.5 F (36.4 C)    TempSrc:  Oral    Resp: 22 12 16 14   Height:      Weight:      SpO2:  99% 100% 100%   Physical Exam General: awake, alert Heart: tachy reg Lungs: no rales, dim bases Abdomen: obese, soft NT Extremities: left LE +++ edema into thigh, right LE no sig edema Dialysis Access: right TDC and left thigh AVGG patent - 01/04/2015  Dialysis Orders: [facility] TTS 4 hr 400/800 EDW 87.5 2 K 2 Ca no profile right thigh TDC and left thigh graft (placed 10/7) Aranesp 200/week-given 10/27 heparin 2600 no labs no Fe or VDRA  NO labs - hx prior steal of left arm graft s/p ligation 12/19/14  Additional Objective Labs: Basic Metabolic Panel:  Recent Labs Lab 01/25/15 0405 01/25/15 1918 01/25/15 2219  NA 137 138 136  K 2.6* 3.5 2.8*  CL 92* 95* 98*  CO2 28 26 26   GLUCOSE 65 65 115*  BUN 10 15 14   CREATININE 5.32* 5.77* 5.54*  CALCIUM 7.8* 7.3* 6.8*  PHOS  --   --  2.0*     Recent Labs Lab 01/25/15 2219  ALBUMIN 1.6*   CBC:  Recent Labs Lab 01/25/15 0405 01/26/15 0109 01/26/15 0723  WBC 16.2* 12.3* 13.0*  NEUTROABS 15.3*  --   --   HGB 8.9* 7.2* 7.0*  HCT 27.0* 22.2* 22.0*  MCV 91.8 92.9 92.8  PLT 187 163 182   Blood Culture    Component Value Date/Time   SDES  BLOOD RIGHT FEMORAL ARTERY 01/25/2015 1340   SPECREQUEST BOTTLES DRAWN AEROBIC AND ANAEROBIC 10CC 01/25/2015 1340   CULT  01/25/2015 1340    GRAM NEGATIVE RODS CULTURE REINCUBATED FOR BETTER GROWTH    REPTSTATUS PENDING 01/25/2015 1340    Cardiac Enzymes:  Recent Labs Lab 01/25/15 1918 01/26/15 0109 01/26/15 0723  TROPONINI 0.21* 0.18* 0.29*   CBG:  Recent Labs Lab 01/25/15 1823 01/25/15 1951 01/25/15 2032 01/25/15 2322 01/26/15 0255  GLUCAP 50* 54* 101* 84 78    Studies/Results: Ct Head Wo Contrast  01/25/2015  CLINICAL DATA:  39 year old female with new onset left-sided weakness. Code stroke. EXAM: CT HEAD WITHOUT CONTRAST TECHNIQUE: Contiguous axial images were obtained from the base of the skull through the vertex without intravenous contrast. COMPARISON:  12/21/2014 CT and MR FINDINGS: Right MCA/frontal infarct identified with similar distribution and size when compared to 12/21/2014. Calcification/clot in adjacent right MCA branch again noted. No new infarct, hemorrhage, extra-axial fluid collection, midline shift or hydrocephalus identified. No acute or suspicious bony abnormalities are identified. IMPRESSION: Evolutionary changes of subacute right MCA/frontal infarct. No definite evidence of extension but consider MR as clinically indicated. No evidence of new hemorrhage or infarct. Critical Value/emergent results were called by telephone at the time of interpretation on 01/25/2015 at 11:16 am to Dr. Doy Mince, who verbally acknowledged these results. Electronically Signed   By: Margarette Canada M.D.   On: 01/25/2015 11:18   Mr Brain Wo Contrast  01/25/2015  CLINICAL DATA:  Left leg weakness. Fevers. Possible sepsis. Recent infarct. EXAM: MRI HEAD WITHOUT CONTRAST TECHNIQUE: Multiplanar, multiecho pulse sequences of the brain and surrounding structures were obtained without intravenous contrast. COMPARISON:  CT head without contrast 01/25/2015. MRI brain 12/21/2014 FINDINGS:  The diffusion-weighted images demonstrate some expansion of the anterior right MCA territory infarct along the medial edge. There is also a new restricted diffusion within the right caudate head and lentiform nucleus. There is no associated hemorrhage. T2 changes have slightly progressed as well. No acute left-sided infarcts are present. There is no significant left-sided white matter disease. Flow is present in the major intracranial arteries. The brainstem and cerebellum are within normal limits. Mucosal disease is evident along the floor of the maxillary sinuses bilaterally and partial opacification of the right sphenoid sinus is again noted. Bilateral mastoid effusions are present. No obstructing at nasopharyngeal lesion is evident. IMPRESSION: 1. Medial to expansion of the anterior right MCA territory infarct. 2. Restricted diffusion involving the right caudate head and lentiform nucleus compatible with acute ischemic changes in the basal ganglia as well. 3. Stable sinus disease. Electronically Signed  By: San Morelle M.D.   On: 01/25/2015 15:51   Dg Chest Port 1 View  01/25/2015  CLINICAL DATA:  Acute onset of fever.  Initial encounter. EXAM: PORTABLE CHEST 1 VIEW COMPARISON:  Chest radiograph performed 01/11/2015 FINDINGS: The lungs are hypoexpanded. A small left pleural effusion is noted, with left basilar airspace opacity, which may reflect mild pneumonia. There is no evidence of pneumothorax. The cardiomediastinal silhouette is borderline enlarged. No acute osseous abnormalities are seen. IMPRESSION: Lungs hypoexpanded. Small left pleural effusion, with left basilar airspace opacity, which may reflect mild pneumonia. Borderline cardiomegaly. Electronically Signed   By: Garald Balding M.D.   On: 01/25/2015 04:16   Medications: . heparin 1,100 Units/hr (01/26/15 0435)   . atorvastatin  20 mg Oral q1800  . [START ON 01/31/2015] darbepoetin (ARANESP) injection - DIALYSIS  200 mcg Intravenous  Q Thu-HD  . hydroxychloroquine  400 mg Oral Daily  . isosorbide mononitrate  30 mg Oral Daily  . [START ON 01/28/2015] levofloxacin (LEVAQUIN) IV  500 mg Intravenous Q48H  . meropenem (MERREM) IV  500 mg Intravenous Q24H  . metoprolol succinate  25 mg Oral QHS  . multivitamin  1 tablet Oral QHS  . pantoprazole  20 mg Oral Daily  . sodium chloride  3 mL Intravenous Q12H  . sodium chloride  3 mL Intravenous Q12H  . Warfarin - Pharmacist Dosing Inpatient   Does not apply [phone]

## 2015-01-26 NOTE — Progress Notes (Signed)
ANTIBIOTIC CONSULT NOTE - INITIAL  Pharmacy Consult for Merrem/Levaquin (stopping Zosyn) Indication: GNR bacteremai, worsening hypotension on Zosyn  Allergies  Allergen Reactions  . Food Swelling    Red peppers    Patient Measurements: Height: 5\' 3"  (160 cm) Weight: 197 lb 12 oz (89.7 kg) IBW/kg (Calculated) : 52.4  Vital Signs: Temp: 99 F (37.2 C) (10/28 2319) Temp Source: Oral (10/28 2319) BP: 85/50 mmHg (10/29 0030) Pulse Rate: 115 (10/29 0030)  Labs:  Recent Labs  01/25/15 0405 01/25/15 1918 01/25/15 2219  WBC 16.2*  --   --   HGB 8.9*  --   --   PLT 187  --   --   CREATININE 5.32* 5.77* 5.54*    Microbiology: Recent Results (from the past 720 hour(s))  Urine culture     Status: None   Collection Time: 12/29/14  7:26 AM  Result Value Ref Range Status   Specimen Description URINE, RANDOM  Final   Special Requests NONE  Final   Culture >=100,000 COLONIES/mL PSEUDOMONAS AERUGINOSA  Final   Report Status 01/01/2015 FINAL  Final   Organism ID, Bacteria PSEUDOMONAS AERUGINOSA  Final      Susceptibility   Pseudomonas aeruginosa - MIC*    CEFTAZIDIME 4 SENSITIVE Sensitive     CIPROFLOXACIN 0.5 INTERMEDIATE Intermediate     GENTAMICIN <=1 SENSITIVE Sensitive     IMIPENEM 2 SENSITIVE Sensitive     PIP/TAZO 16 SENSITIVE Sensitive     CEFEPIME 8 SENSITIVE Sensitive     * >=100,000 COLONIES/mL PSEUDOMONAS AERUGINOSA  Urine culture     Status: None   Collection Time: 01/11/15  4:35 PM  Result Value Ref Range Status   Specimen Description URINE, CATHETERIZED  Final   Special Requests NONE  Final   Culture NO GROWTH 2 DAYS  Final   Report Status 01/13/2015 FINAL  Final  Culture, blood (routine x 2)     Status: None   Collection Time: 01/11/15  4:55 PM  Result Value Ref Range Status   Specimen Description BLOOD BLOOD LEFT HAND  Final   Special Requests BOTTLES DRAWN AEROBIC ONLY 5CC  Final   Culture NO GROWTH 5 DAYS  Final   Report Status 01/16/2015 FINAL   Final  Culture, blood (routine x 2)     Status: None   Collection Time: 01/11/15  5:06 PM  Result Value Ref Range Status   Specimen Description BLOOD BLOOD RIGHT HAND  Final   Special Requests BOTTLES DRAWN AEROBIC ONLY 5CC  Final   Culture NO GROWTH 5 DAYS  Final   Report Status 01/16/2015 FINAL  Final  Culture, blood (routine x 2)     Status: None   Collection Time: 01/13/15  3:20 PM  Result Value Ref Range Status   Specimen Description BLOOD PICC LINE  Final   Special Requests BOTTLES DRAWN AEROBIC AND ANAEROBIC 5CC  Final   Culture NO GROWTH 6 DAYS  Final   Report Status 01/19/2015 FINAL  Final  Culture, blood (routine x 2)     Status: None   Collection Time: 01/14/15 11:34 AM  Result Value Ref Range Status   Specimen Description BLOOD RIGHT HAND  Final   Special Requests BOTTLES DRAWN AEROBIC AND ANAEROBIC 5CC  Final   Culture NO GROWTH 5 DAYS  Final   Report Status 01/19/2015 FINAL  Final  Culture, blood (routine x 2)     Status: None   Collection Time: 01/14/15 11:50 AM  Result Value Ref  Range Status   Specimen Description BLOOD RIGHT FINGER  Final   Special Requests BOTTLES DRAWN AEROBIC AND ANAEROBIC 5CC  Final   Culture NO GROWTH 5 DAYS  Final   Report Status 01/19/2015 FINAL  Final  C difficile quick scan w PCR reflex     Status: None   Collection Time: 01/15/15  5:18 AM  Result Value Ref Range Status   C Diff antigen NEGATIVE NEGATIVE Final   C Diff toxin NEGATIVE NEGATIVE Final   C Diff interpretation Negative for toxigenic C. difficile  Final  Culture, blood (routine x 2)     Status: None (Preliminary result)   Collection Time: 01/25/15  1:40 PM  Result Value Ref Range Status   Specimen Description BLOOD RIGHT FEMORAL ARTERY  Final   Special Requests BOTTLES DRAWN AEROBIC AND ANAEROBIC 10CC  Final   Culture  Setup Time   Final    GRAM NEGATIVE RODS AEROBIC BOTTLE ONLY CRITICAL RESULT CALLED TO, READ BACK BY AND VERIFIED WITH: Wyn Quaker RN 2106 01/25/15 A  BROWNING    Culture PENDING  Incomplete   Report Status PENDING  Incomplete  MRSA PCR Screening     Status: None   Collection Time: 01/25/15  8:35 PM  Result Value Ref Range Status   MRSA by PCR NEGATIVE NEGATIVE Final    Comment:        The GeneXpert MRSA Assay (FDA approved for NASAL specimens only), is one component of a comprehensive MRSA colonization surveillance program. It is not intended to diagnose MRSA infection nor to guide or monitor treatment for MRSA infections.     Medical History: Past Medical History  Diagnosis Date  . Hypertension   . Cardiac arrhythmia   . SVT (supraventricular tachycardia) ([place])     "today, last week, 2 wk ago; maybe 1 month ago, etc; started w/in last 3-4 yrs"(07/20/2012)  . Fainting     "~ 1 month ago; probably related to SVT" (07/20/2012)  . Shortness of breath     "related to SVT episodes" (07/20/2012)  . Chest pain at rest     "related to SVT" (07/20/2012)  . Lupus (systemic lupus erythematosus) ([place])   . GERD (gastroesophageal reflux disease)   . Fibromyalgia   . Irritable bowel syndrome (IBS)   . Hypercholesterolemia   . Heart murmur     "small" (12/25/2014)  . TIA (transient ischemic attack) 12/21/2014  . Sleep apnea     "don't wear mask anymore" (12/25/2014)  . Anemia   . History of blood transfusion "several"    "related to low counts"  . Daily headache   . Arthritis     "hips" (12/25/2014)  . Anxiety     "sometimes; don't take anything for it" (12/25/2014)  . ESRD (end stage renal disease) on dialysis ([place]) since 12/07/2014    "I've been doing it here on [facility]" (12/25/2014)  . Stroke [facility])    Assessment: 39 y/o F with ESRD on HD TTS, has GNR bacteremia, also on vancomycin, has worsening hypotension not resolving with fluid boluses, MD wishes to switch from Zosyn to Merrem/Levaquin until speciation of GNR. 3  Plan:  -Already on vanco -Merrem 500 mg IV x 1 now, then 500 mg IV q24h -Levaquin 750 mg IV x 1 now, then 500 mg IV  q48h -F/U blood cultures for speciation/sensi  Narda Bonds 01/26/2015,1:17 AM

## 2015-01-27 DIAGNOSIS — R509 Fever, unspecified: Secondary | ICD-10-CM

## 2015-01-27 DIAGNOSIS — A4152 Sepsis due to Pseudomonas: Secondary | ICD-10-CM

## 2015-01-27 LAB — RENAL FUNCTION PANEL
Albumin: 1.4 g/dL — ABNORMAL LOW (ref 3.5–5.0)
Anion gap: 10 (ref 5–15)
BUN: 16 mg/dL (ref 6–20)
CHLORIDE: 96 mmol/L — AB (ref 101–111)
CO2: 25 mmol/L (ref 22–32)
Calcium: 6.9 mg/dL — ABNORMAL LOW (ref 8.9–10.3)
Creatinine, Ser: 5.1 mg/dL — ABNORMAL HIGH (ref 0.44–1.00)
GFR calc Af Amer: 11 mL/min — ABNORMAL LOW (ref >60)
GFR, EST NON AFRICAN AMERICAN: 10 mL/min — AB (ref >60)
Glucose, Bld: 64 mg/dL — ABNORMAL LOW (ref 65–99)
POTASSIUM: 2.9 mmol/L — AB (ref 3.5–5.1)
Phosphorus: 1.6 mg/dL — ABNORMAL LOW (ref 2.5–4.6)
Sodium: 131 mmol/L — ABNORMAL LOW (ref 135–145)

## 2015-01-27 LAB — BASIC METABOLIC PANEL
Anion gap: 8 (ref 5–15)
BUN: 13 mg/dL (ref 6–20)
CO2: 27 mmol/L (ref 22–32)
Calcium: 6.5 mg/dL — ABNORMAL LOW (ref 8.9–10.3)
Chloride: 97 mmol/L — ABNORMAL LOW (ref 101–111)
Creatinine, Ser: 4.87 mg/dL — ABNORMAL HIGH (ref 0.44–1.00)
GFR calc Af Amer: 12 mL/min — ABNORMAL LOW (ref >60)
GFR calc non Af Amer: 10 mL/min — ABNORMAL LOW (ref >60)
Glucose, Bld: 57 mg/dL — ABNORMAL LOW (ref 65–99)
Potassium: 2.8 mmol/L — ABNORMAL LOW (ref 3.5–5.1)
Sodium: 132 mmol/L — ABNORMAL LOW (ref 135–145)

## 2015-01-27 LAB — CBC
HCT: 24.2 % — ABNORMAL LOW (ref 36.0–46.0)
Hemoglobin: 7.8 g/dL — ABNORMAL LOW (ref 12.0–15.0)
MCH: 29.2 pg (ref 26.0–34.0)
MCHC: 32.2 g/dL (ref 30.0–36.0)
MCV: 90.6 fL (ref 78.0–100.0)
Platelets: 153 10*3/uL (ref 150–400)
RBC: 2.67 MIL/uL — ABNORMAL LOW (ref 3.87–5.11)
RDW: 18 % — ABNORMAL HIGH (ref 11.5–15.5)
WBC: 7.3 10*3/uL (ref 4.0–10.5)

## 2015-01-27 LAB — GLUCOSE, CAPILLARY
GLUCOSE-CAPILLARY: 44 mg/dL — AB (ref 65–99)
GLUCOSE-CAPILLARY: 66 mg/dL (ref 65–99)
Glucose-Capillary: 73 mg/dL (ref 65–99)

## 2015-01-27 LAB — CULTURE, BLOOD (ROUTINE X 2)

## 2015-01-27 LAB — TYPE AND SCREEN
ABO/RH(D): A POS
Antibody Screen: NEGATIVE
Unit division: 0

## 2015-01-27 LAB — PROTIME-INR
INR: 2.22 — ABNORMAL HIGH (ref 0.00–1.49)
PROTHROMBIN TIME: 24.4 s — AB (ref 11.6–15.2)

## 2015-01-27 LAB — C DIFFICILE QUICK SCREEN W PCR REFLEX
C DIFFICILE (CDIFF) INTERP: NEGATIVE
C DIFFICILE (CDIFF) TOXIN: NEGATIVE
C DIFFICLE (CDIFF) ANTIGEN: NEGATIVE

## 2015-01-27 MED ORDER — TRIMETHOBENZAMIDE HCL 300 MG PO CAPS
300.0000 mg | ORAL_CAPSULE | Freq: Once | ORAL | Status: AC
  Administered 2015-01-27: 300 mg via ORAL
  Filled 2015-01-27: qty 1

## 2015-01-27 MED ORDER — POTASSIUM CHLORIDE CRYS ER 20 MEQ PO TBCR
40.0000 meq | EXTENDED_RELEASE_TABLET | Freq: Once | ORAL | Status: AC
  Administered 2015-01-27: 40 meq via ORAL
  Filled 2015-01-27: qty 2

## 2015-01-27 MED ORDER — TRIMETHOBENZAMIDE HCL 300 MG PO CAPS
300.0000 mg | ORAL_CAPSULE | Freq: Three times a day (TID) | ORAL | Status: DC | PRN
  Administered 2015-01-27: 300 mg via ORAL
  Filled 2015-01-27 (×2): qty 1

## 2015-01-27 MED ORDER — POTASSIUM CHLORIDE CRYS ER 20 MEQ PO TBCR: 40.0000 meq | EXTENDED_RELEASE_TABLET | Freq: Once | ORAL | Status: DC

## 2015-01-27 MED ORDER — LOPERAMIDE HCL 2 MG PO CAPS
2.0000 mg | ORAL_CAPSULE | ORAL | Status: DC | PRN
  Administered 2015-01-27 (×2): 2 mg via ORAL
  Filled 2015-01-27 (×2): qty 1

## 2015-01-27 NOTE — Progress Notes (Signed)
[facility] Progress Note Patient Name: [name] DOB: [birth year] MRN: [id number]   Date of Service  01/27/2015  HPI/Events of Note  K low  eICU Interventions  K supp      Intervention Category Major Interventions: Electrolyte abnormality - evaluation and management  Asencion Noble 01/27/2015, 5:16 PM

## 2015-01-27 NOTE — Progress Notes (Addendum)
STROKE TEAM PROGRESS NOTE   HISTORY [name] is an 39 y.o. female with history of R MCA CVA, HTN, She had just completed a prolonged hospital course (9/5-10/8) and stay in inpatient rehab (10/10 - 10/20). While in inpatient rehab, she continued to spike fevers as high as 103. At the time of admission, the potential sources of infection included a potential endocarditis, PICC site, but no definitive source was found. She was continued on Zosyn as an inpatient until 10/19 and received vancomycin IV from her dialysis center on [address] until 10/25. After discharge on 10/20, [name]'s mother said she has continued to have spiking fevers, as high as 103, chills, night sweats, left leg swelling and left leg graft pain and this am fell due to pain in left leg and weakness. She has recently been taken off her Coumadin which she was taking for valvular vegetation. Today she returned to hospital due to to not feeling well and increased pain and swelling in her left leg. Patient is being admitted for work up of sepsis. While on the floor residents noted she seemed to be weaker on the left leg. Code stroke was called. On arrival patient was complaining of left leg pain and stated she was not moving due to pain. Left leg is warm and swollen. In talking with the patient she states her left leg has been weak for 2 weeks since surgery. It has been unchanged.  At the time of initial eval. her left leg was felt to be weak but unchanged.   Date last known well: Unable to determine Time last known well: Unable to determine tPA Given: No: no clear LSN, PT elevated, recent infarct   SUBJECTIVE (INTERVAL HISTORY) Patient reports improvement.  Only ROS complaint is back discomfort which improved with repositioning.   OBJECTIVE  Temp:  [98.1 F (36.7 C)-101.9 F (38.8 C)] 98.1 F (36.7 C) (10/30 0400) Pulse Rate:  [98-147] 107 (10/30 1000) Cardiac Rhythm:  [-] Sinus tachycardia (10/30 0900) Resp:  [9-33]  30 (10/30 1000) BP: (105-163)/(55-93) 163/81 mmHg (10/30 1000) SpO2:  [94 %-100 %] 98 % (10/30 1000) Weight:  [95.6 kg (210 lb 12.2 oz)] 95.6 kg (210 lb 12.2 oz) (10/30 0600)  CBC:   Recent Labs Lab 01/25/15 0405  01/26/15 0723 01/27/15 0525  WBC 16.2*  < > 13.0* 7.3  NEUTROABS 15.3*  --   --   --   HGB 8.9*  < > 7.0* 7.8*  HCT 27.0*  < > 22.0* 24.2*  MCV 91.8  < > 92.8 90.6  PLT 187  < > 182 153  < > = values in this interval not displayed.  Basic Metabolic Panel:   Recent Labs Lab 01/25/15 2219 01/26/15 1051 01/27/15 0525  NA 136 138 132*  K 2.8* 4.2 2.8*  CL 98* 103 97*  CO2 26 24 27   GLUCOSE 115* 76 57*  BUN 14 9 13   CREATININE 5.54* 3.52* 4.87*  CALCIUM 6.8* 6.8* 6.5*  PHOS 2.0* 1.5*  --     Lipid Panel:     Component Value Date/Time   CHOL 113 12/21/2014 1638   TRIG 107 12/21/2014 1638   HDL 53 12/21/2014 1638   CHOLHDL 2.1 12/21/2014 1638   VLDL 21 12/21/2014 1638   LDLCALC 39 12/21/2014 1638   HgbA1c:  Lab Results  Component Value Date   HGBA1C 6.1* 12/22/2014   Urine Drug Screen:     Component Value Date/Time   LABOPIA NONE DETECTED 07/21/2012  [place] DETECTED 07/21/2012 0215   LABBENZ NONE DETECTED 07/21/2012 0215   AMPHETMU NONE DETECTED 07/21/2012 0215   THCU NONE DETECTED 07/21/2012 0215   LABBARB NONE DETECTED 07/21/2012 0215      IMAGING  Ct Head Wo Contrast 01/25/2015   Evolutionary changes of subacute right MCA/frontal infarct. No definite evidence of extension but consider MR as clinically indicated. No evidence of new hemorrhage or infarct.  Mr Brain Wo Contrast 01/25/2015   1. Medial to expansion of the anterior right MCA territory infarct.  2. Restricted diffusion involving the right caudate head and lentiform nucleus compatible with acute ischemic changes in the basal ganglia as well.  3. Stable sinus disease.  Dg Chest Port 1 View 01/25/2015   Lungs hypoexpanded. Small left pleural effusion, with  left basilar airspace opacity, which may reflect mild pneumonia. Borderline cardiomegaly.    PHYSICAL EXAM HEENT- Normocephalic, no lesions, without obvious abnormality. Normal external eye and conjunctiva slightly injected.Normal external nose, mucus membranes and septum. Normal pharynx. Cardiovascular- tachycardic  Lungs- chest clear, no wheezing, rales, normal symmetric air entry Abdomen- normal findings: bowel sounds normal Extremities- positive findings: swelling in left LE Musculoskeletal-no joint tenderness, deformity or swelling Skin-warm and dry   Neurological Examination Mental Status: Alert, oriented, thought content appropriate. Speech fluent without evidence of aphasia. Able to follow 3 step commands without difficulty.  Cranial Nerves: II:  Visual fields grossly normal, pupils equal, round, reactive to light and accommodation III,IV, VI: ptosis not present, extra-ocular motions intact bilaterally V,VII: smile symmetric, facial light touch sensation normal bilaterally VIII: hearing normal bilaterally IX,X: uvula rises symmetrically XI: bilateral shoulder shrug XII: midline tongue extension  Motor:  Exam limited by patient's nausea and malaise; with best effort Right :Upper extremity 5/5Left: Upper extremity 5/5 Lower extremity 3/5Lower extremity 3/5 Both legs move mostly in the plane; patient is deconditioned   Tone and bulk:normal tone throughout; no atrophy noted Sensory: light touch intact throughout, bilaterally Cerebellar: Finger to nose not disproportionate to weakness Gait: not tested   ASSESSMENT/PLAN [name] is a 39 y.o. female with history of right middle cerebral artery CVA, hypertension, Coumadin therapy for valvular vegetation, end-stage renal disease on dialysis, hypertension, supraventricular tachycardia, systemic  lupus, fibromyalgia, hyperlipidemia, and previous TIA, recent fevers of unknown etiology presenting with left lower extremity weakness pain and swelling.  She did not receive IV t-PA due to unknown time of onset and recent stroke.   Stroke:  Non-dominant anterior right MCA territory infarct probably embolic from valvular vegetation.  Resultant  Malaise and lethargy  MRI - Medial to expansion of the anterior right MCA territory infarct.   MRA - 12/21/2014 - Right MCA anterior division M2 branch occlusion with associated patchy right MCA infarct.  Carotid Doppler - See CTA  CTA of neck 12/24/2014 - No extracranial cerebral vascular disease is evident.  Bilateral lower extremity duplex - 01/14/2015 - negative for DVT  2D Echo - 01/25/2015 - EF 60-65%. No cardiac source of emboli identified.  TEE - 12/25/2014 - Shaggy mobile density that emenates off the lateral aspect of the posterior mitral valve leaflet c/w vegetation.   LDL - 12/21/2014 - 39  HgbA1c - Pending  VTE prophylaxis - SCDs  Diet 2 gram sodium Room service appropriate?: Yes; Fluid consistency:: Thin; Fluid restriction:: 1200 mL Fluid  No antithrombotic prior to admission, now off anticoagulation.  Ongoing aggressive stroke risk factor management  Therapy recommendations:- Pending  Disposition: Pending  Hypertension  Stable  Permissive hypertension (OK if < 220/120) but gradually normalize in 5-7 days  Hyperlipidemia  Home meds:  Lipitor 20 mg daily resumed in hospital  LDL 39, goal < 70  Continue statin at discharge   Other Stroke Risk Factors  Obesity, Body mass index is 37.34 kg/(m^2).   Endocarditis  Obstructive sleep apnea, on CPAP at home   Hospital day # 2   Attending System Review  Neuro  Embolic stroke likely due to murantic emboli given history of bacterial endocarditis and new positive blood cultures.   Current ECHO does not show vegetations; previous ECHO was a TEE  May wish to  repeat TEE  Discussed case with Dr. Malvin Johns.  Now that blood culture continues to result with positive findings the risk for hemorrhagic transformation of cerebral emboli appears higher as they are likely due to infectious endocarditis.  We agreee to discontinue anticoagulation during the acute infection period.  Recommend [place] and CHAD-Vasc scores compared to HAS-BLED scores at teh time long-term secondary prevention is selected in the future.  INR - 2.22 on 01/27/2015 - warfarin discontinued - IV heparin discontinued  Cardiac  Primary team managing chest pain and tachycardia  Pulmonary  Recommend CXR in the AM - not performed  GI  N/V; was NPO - now on 2 g sodium diet with thin liquids.  C. difficile negative on 01/27/2015  GU   Nephrology following  Endo  If not done, recommend TSH  Heme / ID  Agree with broad spectrum antibiotics to cover gram negative results  Anemia H/H 7.8 / 24.2  Electrolytes  Potassium 2.8 on 01/27/2015 - supplemented  Sodium 132 on 01/27/2015  Do not recommend anticoagulation or anti platelet therapy at this time.  Literature debates the best time to restart; however, typically two weeks with antibiotic management and then review CHAD-VASC and HAS-BLED scores for planning The Stroke Team will sign off. Please call if we can be of further service.   Mikey Bussing PA-C [facility] Pager [phone] 01/27/2015, 3:09 PM   CRITICAL CARE NEUROLOGY ATTENDING NOTE Patient was seen and examined by me personally. I reviewed notes, independently viewed imaging studies, participated in medical decision making and plan of care. I have made additions or clarifications directly to the above note.  Documentation accurately reflects findings. The laboratory and radiographic studies were personally reviewed by me.  ROS completed by me personally and pertinent positives fully documented. Assessment and plan completed by me personally and  fully documented above.  Condition is improved     This patient is critically ill and at significant risk of neurological worsening, death and care requires constant monitoring of vital signs, hemodynamics,respiratory and cardiac monitoring, extensive review of multiple databases, frequent neurological assessment, discussion with family, other specialists and medical decision making of high complexity.  This critical care time does not reflect procedure time, or teaching time or supervisory time of PA/NP/Med Resident etc. but could involve care discussion time.  I spent 30 minutes of Neurocritical Care time in the care of  this patient.  SIGNED BY: Dr. Elissa Hefty     To contact Stroke Continuity provider, please refer to [email or web address] After hours, contact General Neurology

## 2015-01-27 NOTE — Progress Notes (Signed)
PULMONARY / CRITICAL CARE MEDICINE TRANSFER NOTE   Name: [name] MRN: [id number] DOB: 1975-06-28    ADMISSION DATE:  01/25/2015  CHIEF COMPLAINT:  Septic shock  INITIAL PRESENTATION: Sepsis with GNR bacteremia  SIGNIFICANT EVENTS: 01/25/2015- Worsening shock with hypotension, tachycardia and lactatemia despite aggressive fluid resuscitation  01/26/2015 - Transfer to 2H ICU   HISTORY OF PRESENT ILLNESS:   39AAF with h/o ESRD on TTS HD from tunneled line, lupus nephritis, lupus anticoagulant and prior right MCA CVA who presented with fevers to 103.  She was started on vanco/zosyn with persistent fevers and progressive hypotension, tachycardia and rising lactate (shock).    SUBJECTIVE:  Note fever, chills, rigors, tachycardia in HD 10/29 > cancelled R femoral HD catheter removed yesterday Having Nausea and emesis, also loose stools Heparin and coumadin stopped  VITAL SIGNS: Temp:  [98.1 F (36.7 C)-101.9 F (38.8 C)] 98.1 F (36.7 C) (10/30 0400) Pulse Rate:  [98-150] 105 (10/30 0800) Resp:  [9-33] 32 (10/30 0800) BP: (105-161)/(55-93) 161/78 mmHg (10/30 0800) SpO2:  [94 %-100 %] 98 % (10/30 0800) Weight:  [95.6 kg (210 lb 12.2 oz)] 95.6 kg (210 lb 12.2 oz) (10/30 0600) HEMODYNAMICS:   VENTILATOR SETTINGS:   INTAKE / OUTPUT:  Intake/Output Summary (Last 24 hours) at 01/27/15 0948 Last data filed at 01/27/15 0900  Gross per 24 hour  Intake   1080 ml  Output   -438 ml  Net   1518 ml    PHYSICAL EXAMINATION: General:  NAD, AAOx3  Neuro:  Left arm decreased strength and mobility HEENT:  PERLA, MM dry Cardiovascular:  Tachycardic, regular, no m/r/g Lungs:  CTA b/l no w/r/r Abdomen:  Soft, non tender, normal bowel sounds Musculoskeletal:  Left hand contracture Skin:  Left thigh swollen, mild warmth Extremities: Left leg with 1+ pitting edema, warm, indurated thigh with erythema.   LABS:  CBC  Recent Labs Lab 01/26/15 0109 01/26/15 0723 01/27/15 0525   WBC 12.3* 13.0* 7.3  HGB 7.2* 7.0* 7.8*  HCT 22.2* 22.0* 24.2*  PLT 163 182 153   Coag's  Recent Labs Lab 01/26/15 0057 01/26/15 0723 01/27/15 0525  INR 1.89* 2.04* 2.22*   BMET  Recent Labs Lab 01/25/15 2219 01/26/15 1051 01/27/15 0525  NA 136 138 132*  K 2.8* 4.2 2.8*  CL 98* 103 97*  CO2 26 24 27   BUN 14 9 13   CREATININE 5.54* 3.52* 4.87*  GLUCOSE 115* 76 57*   Electrolytes  Recent Labs Lab 01/25/15 2219 01/26/15 1051 01/27/15 0525  CALCIUM 6.8* 6.8* 6.5*  PHOS 2.0* 1.5*  --    Sepsis Markers  Recent Labs Lab 01/25/15 2219 01/26/15 0301 01/26/15 0549  LATICACIDVEN 2.7* 1.0 1.1   ABG  Recent Labs Lab 01/25/15 1040  PHART 7.476*  PCO2ART 38.4  PO2ART 77.8*   Liver Enzymes  Recent Labs Lab 01/25/15 2219 01/26/15 1051  ALBUMIN 1.6* 1.4*   Cardiac Enzymes  Recent Labs Lab 01/25/15 1918 01/26/15 0109 01/26/15 0723  TROPONINI 0.21* 0.18* 0.29*   Glucose  Recent Labs Lab 01/25/15 1823 01/25/15 1951 01/25/15 2032 01/25/15 2322 01/26/15 0255  GLUCAP 50* 54* 101* 84 78    Imaging No results found.   ASSESSMENT / PLAN:  77F with ESRD admitted with sepsis from GNR bacteremia, now progressed to septic shock.    CARDIOVASCULAR A: bacterial endocarditis, originally treated w vanco now w documented GNR bacteremia  Septic Shock Secondary PAH P:  - pressors off - Right heart cath after sepsis is  resolved - evaluation for PAH meds in the future - will need repeat TTE or TEE to reassess insetting GNR's  RENAL A:   ESRD P:   - R femoral HD catheter out, ? Whether this was source GNR - next HD to hopefully be done via L thigh access - appreciate Dr Sanda Klein assistance  HEMATOLOGIC A:  Lupus anticoagulant with known CVA, suspected to be embolic from endocarditis.   P:  - discussed case with neurology. The risk/ benefit in setting bacterial embolic CVA favor NO anticoagulation at this time due to risk for hemorrhagic  extension. Will need to reassess role for coumadin in secondary prevention in the future but off heparin and coumadin for now.   GI:  A: diarrhea Nausea P:  - check C diff 10/30  INFECTIOUS A:  GNR bacteremia, multiple potential sources including, tunneled HD line (23 days old) Bacterial endocarditis P:   BCx2 01/25/15 - GNR >> pseudomonas C diff 10/30 >>  UC NGTD Abx:  - continue meropenem - d/c levaquin given sensitivity profile - TTE or TEE once stable to undergo, depending on cards recs - check c diff   NEUROLOGIC A:  Right sided CVA, suspected embolic P:   - hold all anticoagulation   FAMILY  - Updates: discussed plan of care with patient at the bedside   GLOBAL:  will transfer to SDU bed on 10/30, ask IMTS to resume her care   Baltazar Apo, MD, PhD 01/27/2015, 9:59 AM [facility] [phone] or if no answer [phone]

## 2015-01-27 NOTE — Progress Notes (Signed)
[facility] Progress Note  EVENTS: Dialysis aborted yesterday d/t hemodynamic instability, rigors, tachycardia to 170's, hypotension Temp spike to 101 after HD Blood cultures + GNR's (UC "mult spp") Femoral TDC removed yesterday - cath tip culture pending Anticoagulation discontinued due to risk of hemorrhagic transformation of cerebral emboli IF IE Patient states that she DID NOT ever take any coumadin as an outpatient Clinically MUCH improved this AM - HR low 100's, BP soft but stable, awake, alert  Assessment/Plan:    1. GNR sepsis- most likely source fem catheter - catheter was removed post HD yesterday. Currently afebrile. Limited TTE no vegetation noted. Current ATB's  Levaquin and Merrem. Await speciation, sensitivities on the GNR 2. MV vegetation with endocarditis (culture neg)- completed vanco.  3. Subacute right MCA/evolutionary changes repeat MRI 10/28 showed medial expansion of the anterior right MCA infarct and acute ischemic change in the basal ganglia; Neuro following. Anticoagulation stopped d/t risk of hemorrhagic transformation. Pt confirms she never took warfarin as an outpt.  4. ESRD with hypokalemia - dialysis aborted yesterday. K 2.8 today. CAREFUL oral repletion 40 mEq ordered for now - check K this afternoon (trying to delay HD as long as possible as we would like to use the thigh graft and avoid another temporary line).  5. Left thigh AVG - placed 11/7. Graft should be ready to use 11/3- seems to be healing well. Concerned about persistent swelling especially now in light of + blood cultures - had prior neg venous dopplers 10/17 which were negative. Very hesitant to do shuntogram on fairly new AVG with bacteremia. VVS may need to weigh in.   6. BP/volume - marked hypotension on HD yesterday with tachycardia - suspect was just effect of bacteremia from catheter which is now out.  Is net volume + since admission but I see no indication to try to proceed  with dialysis for volume just yet. O2 sats good on RA, lungs clear. 7. Anemia Hgb 8.9> 7 - ?? Dilutional vs loss vs infx - check hemocult - continue max ESA Aranesp 200 tsat 37% ferritin 139 9/17. She did not receive blood on HD yesterday but did get 1 unit on return to the unit (10/29)with Hb 7->7.8 today  8. Metabolic bone disease- not on VDRA, iPTH 71 9/13, not requiring binders 9. Nutrition- given low K, have elected to put on 2 gm Na for now. 10. Hx SVT - on BB. Tachy to 170's yesterday. Trops mild elevation. 11. Lupus - on Plaquenil   Subjective:    Remarkably better today, more alert Nausea during the night No chest pain now   Objective Filed Vitals:   01/27/15 0300 01/27/15 0400 01/27/15 0500 01/27/15 0600  BP: 145/78 140/74 142/66 121/59  Pulse: 103 106 102 98  Temp:  98.1 F (36.7 C)    TempSrc:  Oral    Resp: 27 25 33 24  Height:      Weight:    95.6 kg (210 lb 12.2 oz)  SpO2: 97% 98% 96% 97%   Physical Exam General: awake, alert Heart: S1S2 No S3 HR low 100;s sinus Lungs: Clear Abdomen: obese, soft NT Extremities: Dressing over TDC removal site RLE. No RLE edema. LLE AVG + bruit. Incisions healed. Marked edema of thigh (has been present since AVG placed) Dialysis Access: left thigh AVGG patent - 01/04/2015  Dialysis Orders: [facility] TTS 4 hr 400/800 EDW 87.5 2 K 2 Ca no profile right thigh TDC and left thigh graft (placed 10/7) Aranesp  200/week-given 10/27 heparin 2600 no labs no Fe or VDRA NO labs - hx prior steal of left arm graft s/p ligation 12/19/14  Additional Objective Labs: Basic Metabolic Panel:  Recent Labs Lab 01/25/15 2219 01/26/15 1051 01/27/15 0525  NA 136 138 132*  K 2.8* 4.2 2.8*  CL 98* 103 97*  CO2 26 24 27   GLUCOSE 115* 76 57*  BUN 14 9 13   CREATININE 5.54* 3.52* 4.87*  CALCIUM 6.8* 6.8* 6.5*  PHOS 2.0* 1.5*  --      Recent Labs Lab 01/25/15 2219 01/26/15 1051  ALBUMIN 1.6* 1.4*     Recent Labs Lab 01/25/15 0405  01/26/15 0109 01/26/15 0723 01/27/15 0525  WBC 16.2* 12.3* 13.0* 7.3  NEUTROABS 15.3*  --   --   --   HGB 8.9* 7.2* 7.0* 7.8*  HCT 27.0* 22.2* 22.0* 24.2*  MCV 91.8 92.9 92.8 90.6  PLT 187 163 182 153   Blood Culture    Component Value Date/Time   SDES BLOOD RIGHT FEMORAL ARTERY 01/25/2015 1340   SPECREQUEST BOTTLES DRAWN AEROBIC AND ANAEROBIC 10CC 01/25/2015 1340   CULT  01/25/2015 1340    GRAM NEGATIVE RODS CULTURE REINCUBATED FOR BETTER GROWTH    REPTSTATUS PENDING 01/25/2015 1340      Recent Labs Lab 01/25/15 1918 01/26/15 0109 01/26/15 0723  TROPONINI 0.21* 0.18* 0.29*   CBG:  Recent Labs Lab 01/25/15 1823 01/25/15 1951 01/25/15 2032 01/25/15 2322 01/26/15 0255  GLUCAP 50* 54* 101* 84 78    Studies/Results: Ct Head Wo Contrast  01/25/2015  CLINICAL DATA:  39 year old female with new onset left-sided weakness. Code stroke. EXAM: CT HEAD WITHOUT CONTRAST TECHNIQUE: Contiguous axial images were obtained from the base of the skull through the vertex without intravenous contrast. COMPARISON:  12/21/2014 CT and MR FINDINGS: Right MCA/frontal infarct identified with similar distribution and size when compared to 12/21/2014. Calcification/clot in adjacent right MCA branch again noted. No new infarct, hemorrhage, extra-axial fluid collection, midline shift or hydrocephalus identified. No acute or suspicious bony abnormalities are identified. IMPRESSION: Evolutionary changes of subacute right MCA/frontal infarct. No definite evidence of extension but consider MR as clinically indicated. No evidence of new hemorrhage or infarct. Critical Value/emergent results were called by telephone at the time of interpretation on 01/25/2015 at 11:16 am to Dr. Doy Mince, who verbally acknowledged these results. Electronically Signed   By: Margarette Canada M.D.   On: 01/25/2015 11:18   Mr Brain Wo Contrast  01/25/2015  CLINICAL DATA:  Left leg weakness. Fevers. Possible sepsis. Recent  infarct. EXAM: MRI HEAD WITHOUT CONTRAST TECHNIQUE: Multiplanar, multiecho pulse sequences of the brain and surrounding structures were obtained without intravenous contrast. COMPARISON:  CT head without contrast 01/25/2015. MRI brain 12/21/2014 FINDINGS: The diffusion-weighted images demonstrate some expansion of the anterior right MCA territory infarct along the medial edge. There is also a new restricted diffusion within the right caudate head and lentiform nucleus. There is no associated hemorrhage. T2 changes have slightly progressed as well. No acute left-sided infarcts are present. There is no significant left-sided white matter disease. Flow is present in the major intracranial arteries. The brainstem and cerebellum are within normal limits. Mucosal disease is evident along the floor of the maxillary sinuses bilaterally and partial opacification of the right sphenoid sinus is again noted. Bilateral mastoid effusions are present. No obstructing at nasopharyngeal lesion is evident. IMPRESSION: 1. Medial to expansion of the anterior right MCA territory infarct. 2. Restricted diffusion involving the right caudate head and lentiform  nucleus compatible with acute ischemic changes in the basal ganglia as well. 3. Stable sinus disease. Electronically Signed   By: San Morelle M.D.   On: 01/25/2015 15:51   Medications:   . sodium chloride   Intravenous Once  . atorvastatin  20 mg Oral q1800  . [START ON 01/31/2015] darbepoetin (ARANESP) injection - DIALYSIS  200 mcg Intravenous Q Thu-HD  . hydroxychloroquine  400 mg Oral Daily  . isosorbide mononitrate  30 mg Oral Daily  . [START ON 01/28/2015] levofloxacin (LEVAQUIN) IV  500 mg Intravenous Q48H  . meropenem (MERREM) IV  500 mg Intravenous Q24H  . metoprolol succinate  25 mg Oral QHS  . multivitamin  1 tablet Oral QHS  . pantoprazole  20 mg Oral Daily  . sodium chloride  3 mL Intravenous Q12H  . sodium chloride  3 mL Intravenous Q12H

## 2015-01-27 NOTE — Progress Notes (Signed)
eLink Physician-Brief Progress Note Patient Name: Natika Geyer DOB: Oct 04, 1975 MRN: [id number]   Date of Service  01/27/2015  HPI/Events of Note  Persistent nausea despite zofran  eICU Interventions  Plan: One time dose of tigan 300 mg po     Intervention Category Minor Interventions: Routine modifications to care plan (e.g. PRN medications for pain, fever)  Shakura Cowing 01/27/2015, 12:24 AM

## 2015-01-27 NOTE — Progress Notes (Signed)
Received a call from Dr. Lamonte Sakai today requesting IMTS to resume care for Ms. Rosales on Monday, 10/31.  Therefore, IMTS will resume care tomorrow at 7AM.  We appreciate PCCM's help in caring for Ms. Yagi.   Michail Jewels

## 2015-01-27 NOTE — Progress Notes (Signed)
Reported low BG to Dr Lamonte Sakai. He stated d/t vasculature, he believed inaccurate findings from peripheral stick, said he would assess for further interventions/checks.

## 2015-01-28 ENCOUNTER — Inpatient Hospital Stay (HOSPITAL_COMMUNITY): Payer: Medicaid Other

## 2015-01-28 DIAGNOSIS — A4152 Sepsis due to Pseudomonas: Secondary | ICD-10-CM | POA: Insufficient documentation

## 2015-01-28 LAB — CBC
HEMATOCRIT: 27.1 % — AB (ref 36.0–46.0)
HEMOGLOBIN: 8.9 g/dL — AB (ref 12.0–15.0)
MCH: 29.9 pg (ref 26.0–34.0)
MCHC: 32.8 g/dL (ref 30.0–36.0)
MCV: 90.9 fL (ref 78.0–100.0)
Platelets: 160 10*3/uL (ref 150–400)
RBC: 2.98 MIL/uL — AB (ref 3.87–5.11)
RDW: 18.3 % — ABNORMAL HIGH (ref 11.5–15.5)
WBC: 5.9 10*3/uL (ref 4.0–10.5)

## 2015-01-28 LAB — RENAL FUNCTION PANEL
ANION GAP: 11 (ref 5–15)
Albumin: 1.4 g/dL — ABNORMAL LOW (ref 3.5–5.0)
BUN: 18 mg/dL (ref 6–20)
CALCIUM: 7.5 mg/dL — AB (ref 8.9–10.3)
CO2: 24 mmol/L (ref 22–32)
Chloride: 101 mmol/L (ref 101–111)
Creatinine, Ser: 5.61 mg/dL — ABNORMAL HIGH (ref 0.44–1.00)
GFR calc non Af Amer: 9 mL/min — ABNORMAL LOW (ref >60)
GFR, EST AFRICAN AMERICAN: 10 mL/min — AB (ref >60)
Glucose, Bld: 67 mg/dL (ref 65–99)
POTASSIUM: 3.4 mmol/L — AB (ref 3.5–5.1)
Phosphorus: 1.6 mg/dL — ABNORMAL LOW (ref 2.5–4.6)
SODIUM: 136 mmol/L (ref 135–145)

## 2015-01-28 LAB — CULTURE, BLOOD (ROUTINE X 2)

## 2015-01-28 LAB — PROTIME-INR
INR: 2.21 — AB (ref 0.00–1.49)
PROTHROMBIN TIME: 24.3 s — AB (ref 11.6–15.2)

## 2015-01-28 MED ORDER — CEFTAZIDIME 2 G IJ SOLR
2.0000 g | INTRAMUSCULAR | Status: DC
  Filled 2015-01-28: qty 2

## 2015-01-28 MED ORDER — HEPARIN SODIUM (PORCINE) 5000 UNIT/ML IJ SOLN
5000.0000 [IU] | Freq: Three times a day (TID) | INTRAMUSCULAR | Status: DC
  Administered 2015-01-28 – 2015-01-30 (×6): 5000 [IU] via SUBCUTANEOUS
  Filled 2015-01-28 (×5): qty 1

## 2015-01-28 MED ORDER — DEXTROSE 5 % IV SOLN
2.0000 g | Freq: Once | INTRAVENOUS | Status: AC
  Administered 2015-01-28: 2 g via INTRAVENOUS
  Filled 2015-01-28 (×2): qty 2

## 2015-01-28 MED ORDER — PANTOPRAZOLE SODIUM 40 MG PO TBEC
40.0000 mg | DELAYED_RELEASE_TABLET | Freq: Every day | ORAL | Status: DC
  Administered 2015-01-28 – 2015-02-04 (×7): 40 mg via ORAL
  Filled 2015-01-28 (×7): qty 1

## 2015-01-28 NOTE — Progress Notes (Signed)
ANTIBIOTIC CONSULT NOTE - FOLLOW UP  Pharmacy Consult for Ceftazidime Indication: bacteremia  Allergies  Allergen Reactions  . Food Swelling    Red peppers    Patient Measurements: Height: 5\' 3"  (160 cm) Weight: 208 lb 8.9 oz (94.6 kg) IBW/kg (Calculated) : 52.4  Vital Signs: Temp: 98.4 F (36.9 C) (10/31 0740) Temp Source: Oral (10/31 0740) BP: 115/77 mmHg (10/31 0700) Pulse Rate: 102 (10/31 0700) Intake/Output from previous day: 10/30 0701 - 10/31 0700 In: 1070 [P.O.:780; I.V.:240; IV Piggyback:50] Out: -  Intake/Output from this shift:    Labs:  Recent Labs  01/26/15 0723  01/27/15 0525 01/27/15 1634 01/28/15 0110  WBC 13.0*  --  7.3  --  5.9  HGB 7.0*  --  7.8*  --  8.9*  PLT 182  --  153  --  160  CREATININE  --   < > 4.87* 5.10* 5.61*  < > = values in this interval not displayed. Estimated Creatinine Clearance: 14.7 mL/min (by C-G formula based on Cr of 5.61).  Assessment: 39yof with pseudomonal bacteremia, currently on meropenem, to be de-escalated to ceftazidime. She is ESRD on HD TTS.  Antimicrobials: Ceftriaxone 9/5>>9/6, 9/7>>9/8 Cipro 9/6>>9/7 Cephalexin 9/8>>9/12 Cefuroxime>9/26 x 1 for AVG surgery  Vancomycin 9/27>10/1, 10/3>>10/25 Cefazolin 10/1>>10/3 Ceftazidime 10/3>>10/6, 10/31>> Meropenem 10/29>>10/31 Levaquin 10/28>>10/29 Zosyn 10/28>>10/29  Microbiology Results: Urine 9/5>> > K pneumo susceptible to CTX/Cefazolin BC x 2 9/26> ngF BC x 2 9/27> ngF Urine 10/1>> pseudomonas (I to cipro) Urine 10/14>>ngF BC x2 10/14>>ngF BC x 1 10/16>>ngF BC x2 10/17>>ngF BC x 3 10/28>> 2/3 pseudomonas pan sensitive Urine 10/28>>ngF Cath tip 10/29>>ngtd  Plan:  Ceftazidime 2g IV x 1 today then 2g IV qHD TTS  Deboraha Sprang 01/28/2015,8:35 AM

## 2015-01-28 NOTE — Progress Notes (Signed)
Phlebotomy unable ton draw repeat blood cultures. MD paged. Dr. Merrilyn Puma made aware. Pt's blood cultures already positive. No need to repeat. Will continue to monitor pt closely.

## 2015-01-28 NOTE — Progress Notes (Signed)
[facility] Progress Note   Subjective: no more fevers, some dec'd appetite , no SOB or cough. BP's soft.   Filed Vitals:   01/28/15 0600 01/28/15 0700 01/28/15 0740 01/28/15 0800  BP: 104/74 115/77  93/55  Pulse: 101 102  102  Temp:   98.4 F (36.9 C)   TempSrc:   Oral   Resp: 19 12  19   Height:      Weight:      SpO2: 98% 98%  99%   Exam: Alert, cushingoid, frail/ weak but no distress No jvd Chest clear on L, dec'd R base RRR no MRG Abd soft obese ntnd +bs L thigh1-2+ edema, R thigh 1+ edema, good pedal pulses L thigh AVG +bruit Neuro gen weak, nonfocal  TTS [place]   4h  400800   87.5kg  2K bath  L thigh AVG placed 10/7 (R thigh cath pulled) Hep 2600 No Fe or vit D Aranesp 200/wk      Assessment: 1. Pseudomonas cath sepsis - improved after cath removal (10/29). Had rigors w last use of cath for HD.   2. ESRD will try to avoid more HD catheters, use AVG soon 3. SLE 4. Hx MV endocarditis, s/p course of vanc 5. Hypotension -sepsis probably 6. 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 SLE on plaguenil 9. Anemia on max darbe , transfusing prn 10. Hx SVT on metoprolol for this 25 mg qhs  Plan - no HD needed today. Will ask [facility] to see if AVG ok to use   Kelly Splinter MD [facility] pager [phone]    cell [phone] 01/28/2015, 10:03 AM    Recent Labs Lab 01/26/15 1051 01/27/15 0525 01/27/15 1634 01/28/15 0110  NA 138 132* 131* 136  K 4.2 2.8* 2.9* 3.4*  CL 103 97* 96* 101  CO2 24 27 25 24   GLUCOSE 76 57* 64* 67  BUN 9 13 16 18   CREATININE 3.52* 4.87* 5.10* 5.61*  CALCIUM 6.8* 6.5* 6.9* 7.5*  PHOS 1.5*  --  1.6* 1.6*    Recent Labs Lab 01/26/15 1051 01/27/15 1634 01/28/15 0110  ALBUMIN 1.4* 1.4* 1.4*    Recent Labs Lab 01/25/15 0405  01/26/15 0723 01/27/15 0525 01/28/15 0110  WBC 16.2*  < > 13.0* 7.3 5.9  NEUTROABS 15.3*  --   --   --   --   HGB 8.9*  < > 7.0* 7.8* 8.9*  HCT 27.0*   < > 22.0* 24.2* 27.1*  MCV 91.8  < > 92.8 90.6 90.9  PLT 187  < > 182 153 160  < > = values in this interval not displayed. . sodium chloride   Intravenous Once  . atorvastatin  20 mg Oral q1800  . [START ON 01/29/2015] cefTAZidime (FORTAZ)  IV  2 g Intravenous Q T,Th,Sa-HD  . [START ON 01/31/2015] darbepoetin (ARANESP) injection - DIALYSIS  200 mcg Intravenous Q Thu-HD  . heparin subcutaneous  5,000 Units Subcutaneous 3 times per day  . hydroxychloroquine  400 mg Oral Daily  . metoprolol succinate  25 mg Oral QHS  . multivitamin  1 tablet Oral QHS  . pantoprazole  40 mg Oral Daily  . sodium chloride  3 mL Intravenous Q12H  . sodium chloride  3 mL Intravenous Q12H     sodium chloride, sodium chloride, sodium chloride, sodium chloride, acetaminophen **OR** acetaminophen, cyclobenzaprine, lidocaine (PF), lidocaine-prilocaine, loperamide, ondansetron **OR** ondansetron (ZOFRAN) IV, oxyCODONE-acetaminophen, pentafluoroprop-tetrafluoroeth, polyethylene glycol,  sodium chloride, trimethobenzamide

## 2015-01-28 NOTE — Progress Notes (Signed)
  [facility] Progress Note  History of Present Illness: Asked to see patient regarding use of left thigh AVG placed 01/04/15. Her right femoral catheter was removed on 01/26/15 in the setting of pseudomonas bacteremia. Her leukocytosis has resolved since removal. She is currently on ceftazidime.    Physical Examination  Filed Vitals:   01/28/15 1205  BP: 117/83  Pulse: 103  Temp:   Resp: 24   Body mass index is 36.95 kg/(m^2).  General:  WDWN in NAD Pulmonary: normal non-labored breathing Vascular Exam/Pulses: left thigh AVG with audible bruit. Mild left thigh edema. Left thigh without erythema, graft feels well incorporated. Palpable dorsalis pedis pulses bilaterally.  Neurologic: A&O X 3; Appropriate Affect    ASSESSMENT: This is a 39 y.o. female with ESRD on HD TTS. Other active problems: pseudomonas bacteremia, hypotension, history of murantic endocarditi, SLE  PLAN: Left AV thigh graft patent. Graft is well incorporated without evidence of inflammation. Ok to start using for HD.     Virgina Jock, PA-C [facility] Office: [phone] Pager: [phone]

## 2015-01-28 NOTE — Progress Notes (Signed)
Patient refused cpap for tonight. She stated she does not wear one at home and does not want to wear one here. RT informed patient to have RN call RT if she changes her mind.

## 2015-01-28 NOTE — Progress Notes (Signed)
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 She was found to have pseudomonas bacteremia in setting of right femoral HD line which cultures grew as well and was consequently removed on 10/29. She was initially started on vancomycin and zosyn. She developed septic shock on the night of 10/28 requiring ICU admission for pressor support. Her zosyn was changed to meropenum and levaquin was added for double pseudomonas coverage. She has left AVG placed on 11/7 that will be ready for use on 11/3.   She was also found to have recurrent embolic stroke with medial to expansion of the anterior right MCA and acute ischemic changes in the right caudate head and lentiform nucleus. She reports not taking coumadin after recent discharge. She was started on coumadin and heparin on 10/28 but this was discontinued on 10/30 due to risk of hemorrhagic transformation of cerebral emboli. She had limited TTE on 10/28 that did not show evidence of vegetation.    She currently denies any complaints except for diarrhea. Her c diff was negative.    Objective: Vital signs in last 24 hours: Filed Vitals:   01/28/15 0500 01/28/15 0600 01/28/15 0700 01/28/15 0740  BP: 117/68 104/74 115/77   Pulse: 96 101 102   Temp:    98.4 F (36.9 C)  TempSrc:    Oral  Resp: 14 19 12    Height:      Weight: 208 lb 8.9 oz (94.6 kg)     SpO2: 100% 98% 98%    Weight change: -2 lb 3.3 oz (-1 kg)  Intake/Output Summary (Last 24 hours) at 01/28/15 0802 Last data filed at 01/28/15 0600  Gross per 24 hour  Intake    770 ml  Output      0 ml  Net    770 ml    PHYSICAL EXAMINATION:  General: NAD, resting comfortably in bed Heart:  Mildly tachycardic, normal rhythm, distant heart sounds Lungs: Anterior fields clear to ausculation with no ronchi, wheezing, or rales Abdomen: Obese, non-tender, non-distended, normal BS Extremities: LLE AVG with good bruit. Left thigh with +1 nonpitting edema (chronic). No joint swelling/erythema. Neuro: A & O x3, strength 4/5 in b/l LE and 5/5 in b/l UE Skin: No rashes   Lab Results: Basic Metabolic Panel:  Recent Labs Lab 01/27/15 1634 01/28/15 0110  NA 131* 136  K 2.9* 3.4*  CL 96* 101  CO2 25 24  GLUCOSE 64* 67  BUN 16 18  CREATININE 5.10* 5.61*  CALCIUM 6.9* 7.5*  PHOS 1.6* 1.6*   Liver Function Tests:  Recent Labs Lab 01/27/15 1634 01/28/15 0110  ALBUMIN 1.4* 1.4*   CBC:  Recent Labs Lab 01/25/15 0405  01/27/15 0525 01/28/15 0110  WBC 16.2*  < > 7.3 5.9  NEUTROABS 15.3*  --   --   --   HGB 8.9*  < > 7.8* 8.9*  HCT 27.0*  < > 24.2* 27.1*  MCV 91.8  < > 90.6 90.9  PLT 187  < > 153 160  < > = values in this interval not displayed. Cardiac Enzymes:  Recent Labs Lab 01/25/15 1918 01/26/15 0109 01/26/15 0723  TROPONINI 0.21* 0.18* 0.29*   CBG:  Recent Labs Lab 01/25/15  2032 01/25/15 2322 01/26/15 0255 01/27/15 0752 01/27/15 0827 01/27/15 0915  GLUCAP 101* 84 78 44* 66 73  Coagulation:  Recent Labs Lab 01/26/15 0057 01/26/15 0723 01/27/15 0525 01/28/15 0110  LABPROT 21.6* 22.9* 24.4* 24.3*  INR 1.89* 2.04* 2.22* 2.21*   Anemia Panel:  Recent Labs Lab 01/26/15 0109  FERRITIN 1323*  TIBC NOT CALCULATED  IRON 21*   Urine Drug Screen: Drugs of Abuse     Component Value Date/Time   LABOPIA NONE DETECTED 07/21/2012 0215   COCAINSCRNUR NONE DETECTED 07/21/2012 0215   LABBENZ NONE DETECTED 07/21/2012 0215   AMPHETMU NONE DETECTED 07/21/2012 0215   THCU NONE DETECTED 07/21/2012 0215   LABBARB NONE DETECTED 07/21/2012 0215    Urinalysis:  Recent Labs Lab 01/25/15 0513  COLORURINE YELLOW  LABSPEC 1.033*  PHURINE 6.0    GLUCOSEU NEGATIVE  HGBUR LARGE*  BILIRUBINUR SMALL*  KETONESUR 15*  PROTEINUR >300*  UROBILINOGEN 0.2  NITRITE POSITIVE*  LEUKOCYTESUR MODERATE*    Micro Results: Recent Results (from the past 240 hour(s))  Culture, blood (routine x 2)     Status: None (Preliminary result)   Collection Time: 01/25/15  4:05 AM  Result Value Ref Range Status   Specimen Description BLOOD RIGHT HAND  Final   Special Requests BOTTLES DRAWN AEROBIC AND ANAEROBIC 5CC  Final   Culture NO GROWTH 2 DAYS  Final   Report Status PENDING  Incomplete  Culture, blood (routine x 2)     Status: None (Preliminary result)   Collection Time: 01/25/15  4:34 AM  Result Value Ref Range Status   Specimen Description BLOOD LEFT ARM  Final   Special Requests BOTTLES DRAWN AEROBIC ONLY 10CC  Final   Culture  Setup Time   Final    GRAM NEGATIVE RODS AEROBIC BOTTLE ONLY CRITICAL RESULT CALLED TO, READ BACK BY AND VERIFIED WITH: S HERBERT,RN AT 7616 01/26/15 BY L BENFIELD    Culture   Final    PSEUDOMONAS AERUGINOSA SUSCEPTIBILITIES PERFORMED ON PREVIOUS CULTURE WITHIN THE LAST 5 DAYS.    Report Status PENDING  Incomplete  Urine culture     Status: None   Collection Time: 01/25/15  5:13 AM  Result Value Ref Range Status   Specimen Description URINE, CATHETERIZED  Final   Special Requests NONE  Final   Culture MULTIPLE SPECIES PRESENT, SUGGEST RECOLLECTION  Final   Report Status 01/26/2015 FINAL  Final  Culture, blood (routine x 2)     Status: None   Collection Time: 01/25/15  1:40 PM  Result Value Ref Range Status   Specimen Description BLOOD RIGHT FEMORAL ARTERY  Final   Special Requests BOTTLES DRAWN AEROBIC AND ANAEROBIC 10CC  Final   Culture  Setup Time   Final    GRAM NEGATIVE RODS AEROBIC BOTTLE ONLY CRITICAL RESULT CALLED TO, READ BACK BY AND VERIFIED WITH: Wyn Quaker RN 0737 01/25/15 A BROWNING    Culture PSEUDOMONAS AERUGINOSA  Final   Report Status 01/27/2015 FINAL  Final   Organism ID, Bacteria  PSEUDOMONAS AERUGINOSA  Final      Susceptibility   Pseudomonas aeruginosa - MIC*    CEFTAZIDIME 4 SENSITIVE Sensitive     CIPROFLOXACIN 0.5 SENSITIVE Sensitive     GENTAMICIN <=1 SENSITIVE Sensitive     IMIPENEM 2 SENSITIVE Sensitive     PIP/TAZO 16 SENSITIVE Sensitive     CEFEPIME 8 SENSITIVE Sensitive     * PSEUDOMONAS AERUGINOSA  MRSA PCR Screening     Status: None  Collection Time: 01/25/15  8:35 PM  Result Value Ref Range Status   MRSA by PCR NEGATIVE NEGATIVE Final    Comment:        The GeneXpert MRSA Assay (FDA approved for NASAL specimens only), is one component of a comprehensive MRSA colonization surveillance program. It is not intended to diagnose MRSA infection nor to guide or monitor treatment for MRSA infections.   C difficile quick scan w PCR reflex     Status: None   Collection Time: 01/27/15  9:46 AM  Result Value Ref Range Status   C Diff antigen NEGATIVE NEGATIVE Final   C Diff toxin NEGATIVE NEGATIVE Final   C Diff interpretation Negative for toxigenic C. difficile  Final   Studies/Results: No results found. Medications: I have reviewed the patient's current medications. Scheduled Meds: . sodium chloride   Intravenous Once  . atorvastatin  20 mg Oral q1800  . [START ON 01/31/2015] darbepoetin (ARANESP) injection - DIALYSIS  200 mcg Intravenous Q Thu-HD  . hydroxychloroquine  400 mg Oral Daily  . metoprolol succinate  25 mg Oral QHS  . multivitamin  1 tablet Oral QHS  . pantoprazole  20 mg Oral Daily  . sodium chloride  3 mL Intravenous Q12H  . sodium chloride  3 mL Intravenous Q12H   Continuous Infusions:  PRN Meds:.sodium chloride, sodium chloride, sodium chloride, sodium chloride, acetaminophen **OR** acetaminophen, cyclobenzaprine, lidocaine (PF), lidocaine-prilocaine, loperamide, ondansetron **OR** ondansetron (ZOFRAN) IV, oxyCODONE-acetaminophen, pentafluoroprop-tetrafluoroeth, polyethylene glycol, sodium chloride,  trimethobenzamide Assessment/Plan:  Pansensitive Pseudomonas Catheter Line Blood Stream Infection - Pt afebrile overnight with resolved leukocytosis. Culture from right femoral HD tip catheter with pseudomonas as well making it the likely source which was removed on 10/29. Pt also had previous Pseudomonas UTI during last hospitalization that was treated with 5 doses of ceftaz with urine cultures on admission with multiple species present. -Repeat blood cultures x 2 to ensure clearance  -De-escalate meropenem to ceftazidime per pharmacy Day 4/14 -Monitor fever curve and CBC   Hypotension - Pt with soft blood pressures in setting of recent septic shock requiring pressor support. 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 Pt did not receive tPA due to outside of time frame.  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 Limited TEE on 10/28 with no evidence of vegatations.  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 Pt's right femoral HD catheter was removed on 10/29 in setting of pseudomonas bacteremia. Pt has left thigh AVG placed on 10/7 that will ready for use on 11/3. -Appreciate nephrology following -Continue rena-vit daily  -Monitor renal function  -Daily weights and strict I & O's -May need VVS consultation for  discussion of shuntogram due to chronic  left thigh edema   SLE - No current flare.  -Continue home plaquenil 400 mg daily   Anemia of Chronic Disease - Hg 8.9  near baseline 8-9 with no active bleeding. Pt is s/p 1 pRBC on 10/29. Last Tstat on 12/15/14 was 37% and ferritin 139. Etiology due to ESRD and SLE.  -Continue aranesp to maintain Hg >10 -If Tstat <20% consider IV feraheme infusion  -Monitor CBC and transfuse if Hg <7 -Follow-up FOBT   Hyperlipidemia - Last lipid panel on 12/21/14 with LDL 39.  -Continue home lipitor 20 mg daily   OSA - Pt compliant with CPAP at home.  -CPAP as tolerated at night  GERD - No current acid reflux symptoms.  -Continue protonix 40 mg daily  Prediabetes - Last A1c 6.1 on 12/22/14. -Counsel on lifestyle changes  -Repeat in 6 months to 1 year  Diet: Renal with 1200 mL restriction  DVT Ppx: Start SQ heparin TID Code: Full    Dispo: Disposition is deferred at this time, awaiting improvement of current medical problems.    The patient does have a current PCP Lin Landsman, MD) and does need an [facility] hospital follow-up appointment after discharge.  The patient does have transportation limitations that hinder transportation to clinic appointments.  .Services Needed at time of discharge: Y = Yes, Blank = No PT:   OT:   RN:   Equipment:   Other:     LOS: 3 days   Juluis Mire, MD 01/28/2015, 8:02 AM

## 2015-01-29 DIAGNOSIS — Z992 Dependence on renal dialysis: Secondary | ICD-10-CM

## 2015-01-29 DIAGNOSIS — I38 Endocarditis, valve unspecified: Secondary | ICD-10-CM

## 2015-01-29 DIAGNOSIS — T80211D Bloodstream infection due to central venous catheter, subsequent encounter: Secondary | ICD-10-CM

## 2015-01-29 DIAGNOSIS — I63411 Cerebral infarction due to embolism of right middle cerebral artery: Secondary | ICD-10-CM

## 2015-01-29 DIAGNOSIS — N186 End stage renal disease: Secondary | ICD-10-CM | POA: Insufficient documentation

## 2015-01-29 DIAGNOSIS — I959 Hypotension, unspecified: Secondary | ICD-10-CM

## 2015-01-29 LAB — RENAL FUNCTION PANEL
ALBUMIN: 1.3 g/dL — AB (ref 3.5–5.0)
ANION GAP: 7 (ref 5–15)
BUN: 24 mg/dL — ABNORMAL HIGH (ref 6–20)
CALCIUM: 7.6 mg/dL — AB (ref 8.9–10.3)
CO2: 23 mmol/L (ref 22–32)
CREATININE: 6.55 mg/dL — AB (ref 0.44–1.00)
Chloride: 104 mmol/L (ref 101–111)
GFR, EST AFRICAN AMERICAN: 8 mL/min — AB (ref >60)
GFR, EST NON AFRICAN AMERICAN: 7 mL/min — AB (ref >60)
Glucose, Bld: 66 mg/dL (ref 65–99)
PHOSPHORUS: 2.1 mg/dL — AB (ref 2.5–4.6)
Potassium: 3.8 mmol/L (ref 3.5–5.1)
SODIUM: 134 mmol/L — AB (ref 135–145)

## 2015-01-29 LAB — CBC
HCT: 31.3 % — ABNORMAL LOW (ref 36.0–46.0)
HEMOGLOBIN: 10.4 g/dL — AB (ref 12.0–15.0)
MCH: 29.7 pg (ref 26.0–34.0)
MCHC: 33.2 g/dL (ref 30.0–36.0)
MCV: 89.4 fL (ref 78.0–100.0)
PLATELETS: 152 10*3/uL (ref 150–400)
RBC: 3.5 MIL/uL — AB (ref 3.87–5.11)
RDW: 18.6 % — ABNORMAL HIGH (ref 11.5–15.5)
WBC: 6.5 10*3/uL (ref 4.0–10.5)

## 2015-01-29 MED ORDER — PENTAFLUOROPROP-TETRAFLUOROETH EX AERO: 1.0000 "application " | INHALATION_SPRAY | CUTANEOUS | Status: DC | PRN

## 2015-01-29 MED ORDER — LIDOCAINE HCL (PF) 1 % IJ SOLN: 5.0000 mL | INTRAMUSCULAR | Status: DC | PRN

## 2015-01-29 MED ORDER — SODIUM CHLORIDE 0.9 % IV SOLN: 100.0000 mL | INTRAVENOUS | Status: DC | PRN

## 2015-01-29 MED ORDER — ALTEPLASE 2 MG IJ SOLR
2.0000 mg | Freq: Once | INTRAMUSCULAR | Status: DC | PRN
  Filled 2015-01-29: qty 2

## 2015-01-29 MED ORDER — HEPARIN SODIUM (PORCINE) 1000 UNIT/ML DIALYSIS: 1000.0000 [IU] | INTRAMUSCULAR | Status: DC | PRN

## 2015-01-29 MED ORDER — ONDANSETRON HCL 4 MG/2ML IJ SOLN
INTRAMUSCULAR | Status: AC
  Filled 2015-01-29: qty 2

## 2015-01-29 MED ORDER — LIDOCAINE-PRILOCAINE 2.5-2.5 % EX CREA
1.0000 "application " | TOPICAL_CREAM | CUTANEOUS | Status: DC | PRN
  Filled 2015-01-29: qty 5

## 2015-01-29 MED ORDER — HEPARIN SODIUM (PORCINE) 1000 UNIT/ML DIALYSIS: 2000.0000 [IU] | Freq: Once | INTRAMUSCULAR | Status: DC

## 2015-01-29 NOTE — Progress Notes (Addendum)
  Date: 01/29/2015  Patient name: [name]  Medical record number: [id number]  Date of birth: Aug 25, 1975   [name] plan of care was discussed with the house staff. Please see their note for complete details. I concur with their findings.  1. Pseudomonas line sepsis (pan-sens)  Currently on ceftaz day 5  Line removed 10-29  Repeat BCx pending  2. ESRD  Her LLE AVF has matured and is expected to be functional now.   Greatly appreciate renal f/u  3. R MCA stroke  Consider TEE to f/u her prev MV lesion (9-27)  Appreciate neuro f/u, off anticoagulation due to bleeding risk   Campbell Riches, MD 01/29/2015, 12:01 PM

## 2015-01-29 NOTE — Progress Notes (Signed)
OT Cancellation Note  Patient Details Name: [name] MRN: [id number] DOB: April 04, 1975   Cancelled Treatment:    Reason Eval/Treat Not Completed: Patient at procedure or test/ unavailable.  Will reattempt.   Darlina Rumpf Crawford, OTR/L [phone]   01/29/2015, 3:17 PM

## 2015-01-29 NOTE — Progress Notes (Signed)
Subjective: Feeling well today without any recurrent fevers, dizziness, or lightheadedness. Diarrhea improving today. Undergoing hemodialysis through left femoral AVG today in the p.m. Neurological deficits seem similar compared to yesterday.  Objective: Vital signs in last 24 hours: Filed Vitals:   01/29/15 1535 01/29/15 1543 01/29/15 1558 01/29/15 1630  BP: 93/68 88/58 84/60    Pulse: 124 126 124   Temp:      TempSrc:      Resp:      Height:      Weight:      SpO2: 100%   100%   Weight change: 0.8 kg (1 lb 12.2 oz)  Intake/Output Summary (Last 24 hours) at 01/29/15 1637 Last data filed at 01/29/15 0800  Gross per 24 hour  Intake    320 ml  Output      0 ml  Net    320 ml   GENERAL- alert, co-operative, NAD HEENT- Oral mucosa appears moist CARDIAC- regular rhythm, tachycardic, distant heart sounds RESP- CTAB, no wheezes or crackles. ABDOMEN- Soft, nontender, no guarding or rebound NEURO- No obvious Cr N abnormality, strength upper extremities 5 out of 5, right lower extremity 5 out of 5, left lower extremity 4 out of 5 in hip flexion, knee extension, foot dorsiflexion. EXTREMITIES- LLE AVG with palpable thrill PSYCH- Normal mood and affect, appropriate thought content and speech.   Lab Results: Basic Metabolic Panel:  Recent Labs Lab 01/28/15 0110 01/29/15 0228  NA 136 134*  K 3.4* 3.8  CL 101 104  CO2 24 23  GLUCOSE 67 66  BUN 18 24*  CREATININE 5.61* 6.55*  CALCIUM 7.5* 7.6*  PHOS 1.6* 2.1*   Liver Function Tests:  Recent Labs Lab 01/28/15 0110 01/29/15 0228  ALBUMIN 1.4* 1.3*   No results for input(s): LIPASE, AMYLASE in the last 168 hours. No results for input(s): AMMONIA in the last 168 hours. CBC:  Recent Labs Lab 01/25/15 0405  01/28/15 0110 01/29/15 0228  WBC 16.2*  < > 5.9 6.5  NEUTROABS 15.3*  --   --   --   HGB 8.9*  < > 8.9* 10.4*  HCT 27.0*  < > 27.1* 31.3*  MCV 91.8  < > 90.9 89.4  PLT 187  < > 160 152  < > = values in this  interval not displayed. Cardiac Enzymes:  Recent Labs Lab 01/25/15 1918 01/26/15 0109 01/26/15 0723  TROPONINI 0.21* 0.18* 0.29*   BNP: No results for input(s): PROBNP in the last 168 hours. D-Dimer: No results for input(s): DDIMER in the last 168 hours. CBG:  Recent Labs Lab 01/25/15 2032 01/25/15 2322 01/26/15 0255 01/27/15 0752 01/27/15 0827 01/27/15 0915  GLUCAP 101* 84 78 44* 66 73   Hemoglobin A1C: No results for input(s): HGBA1C in the last 168 hours. Fasting Lipid Panel: No results for input(s): CHOL, HDL, LDLCALC, TRIG, CHOLHDL, LDLDIRECT in the last 168 hours. Thyroid Function Tests: No results for input(s): TSH, T4TOTAL, FREET4, T3FREE, THYROIDAB in the last 168 hours. Coagulation:  Recent Labs Lab 01/26/15 0057 01/26/15 0723 01/27/15 0525 01/28/15 0110  LABPROT 21.6* 22.9* 24.4* 24.3*  INR 1.89* 2.04* 2.22* 2.21*   Anemia Panel:  Recent Labs Lab 01/26/15 0109  FERRITIN 1323*  TIBC NOT CALCULATED  IRON 21*   Urine Drug Screen: Drugs of Abuse     Component Value Date/Time   LABOPIA NONE DETECTED 07/21/2012 0215   COCAINSCRNUR NONE DETECTED 07/21/2012 0215   LABBENZ NONE DETECTED 07/21/2012 0215   AMPHETMU NONE  DETECTED 07/21/2012 0215   THCU NONE DETECTED 07/21/2012 0215   LABBARB NONE DETECTED 07/21/2012 0215    Alcohol Level: No results for input(s): ETH in the last 168 hours. Urinalysis:  Recent Labs Lab 01/25/15 0513  COLORURINE YELLOW  LABSPEC 1.033*  PHURINE 6.0  GLUCOSEU NEGATIVE  HGBUR LARGE*  BILIRUBINUR SMALL*  KETONESUR 15*  PROTEINUR >300*  UROBILINOGEN 0.2  NITRITE POSITIVE*  LEUKOCYTESUR MODERATE*    Micro Results: Recent Results (from the past 240 hour(s))  Culture, blood (routine x 2)     Status: None (Preliminary result)   Collection Time: 01/25/15  4:05 AM  Result Value Ref Range Status   Specimen Description BLOOD RIGHT HAND  Final   Special Requests BOTTLES DRAWN AEROBIC AND ANAEROBIC 5CC  Final    Culture NO GROWTH 4 DAYS  Final   Report Status PENDING  Incomplete  Culture, blood (routine x 2)     Status: None   Collection Time: 01/25/15  4:34 AM  Result Value Ref Range Status   Specimen Description BLOOD LEFT ARM  Final   Special Requests BOTTLES DRAWN AEROBIC ONLY 10CC  Final   Culture  Setup Time   Final    GRAM NEGATIVE RODS AEROBIC BOTTLE ONLY CRITICAL RESULT CALLED TO, READ BACK BY AND VERIFIED WITH: S HERBERT,RN AT 1351 01/26/15 BY L BENFIELD    Culture   Final    PSEUDOMONAS AERUGINOSA SUSCEPTIBILITIES PERFORMED ON PREVIOUS CULTURE WITHIN THE LAST 5 DAYS.    Report Status 01/28/2015 FINAL  Final  Urine culture     Status: None   Collection Time: 01/25/15  5:13 AM  Result Value Ref Range Status   Specimen Description URINE, CATHETERIZED  Final   Special Requests NONE  Final   Culture MULTIPLE SPECIES PRESENT, SUGGEST RECOLLECTION  Final   Report Status 01/26/2015 FINAL  Final  Culture, blood (routine x 2)     Status: None   Collection Time: 01/25/15  1:40 PM  Result Value Ref Range Status   Specimen Description BLOOD RIGHT FEMORAL ARTERY  Final   Special Requests BOTTLES DRAWN AEROBIC AND ANAEROBIC 10CC  Final   Culture  Setup Time   Final    GRAM NEGATIVE RODS AEROBIC BOTTLE ONLY CRITICAL RESULT CALLED TO, READ BACK BY AND VERIFIED WITH: Wyn Quaker RN 2106 01/25/15 A BROWNING    Culture PSEUDOMONAS AERUGINOSA  Final   Report Status 01/27/2015 FINAL  Final   Organism ID, Bacteria PSEUDOMONAS AERUGINOSA  Final      Susceptibility   Pseudomonas aeruginosa - MIC*    CEFTAZIDIME 4 SENSITIVE Sensitive     CIPROFLOXACIN 0.5 SENSITIVE Sensitive     GENTAMICIN <=1 SENSITIVE Sensitive     IMIPENEM 2 SENSITIVE Sensitive     PIP/TAZO 16 SENSITIVE Sensitive     CEFEPIME 8 SENSITIVE Sensitive     * PSEUDOMONAS AERUGINOSA  MRSA PCR Screening     Status: None   Collection Time: 01/25/15  8:35 PM  Result Value Ref Range Status   MRSA by PCR NEGATIVE NEGATIVE Final     Comment:        The GeneXpert MRSA Assay (FDA approved for NASAL specimens only), is one component of a comprehensive MRSA colonization surveillance program. It is not intended to diagnose MRSA infection nor to guide or monitor treatment for MRSA infections.   C difficile quick scan w PCR reflex     Status: None   Collection Time: 01/27/15  9:46 AM  Result Value  Ref Range Status   C Diff antigen NEGATIVE NEGATIVE Final   C Diff toxin NEGATIVE NEGATIVE Final   C Diff interpretation Negative for toxigenic C. difficile  Final  Culture, blood (routine x 2)     Status: None (Preliminary result)   Collection Time: 01/28/15  1:42 PM  Result Value Ref Range Status   Specimen Description BLOOD LEFT HAND  Final   Special Requests IN PEDIATRIC BOTTLE  3CC  Final   Culture NO GROWTH < 24 HOURS  Final   Report Status PENDING  Incomplete   Studies/Results: No results found. Medications: I have reviewed the patient's current medications. Scheduled Meds: . sodium chloride   Intravenous Once  . atorvastatin  20 mg Oral q1800  . [START ON 01/31/2015] darbepoetin (ARANESP) injection - DIALYSIS  200 mcg Intravenous Q Thu-HD  . heparin subcutaneous  5,000 Units Subcutaneous 3 times per day  . hydroxychloroquine  400 mg Oral Daily  . metoprolol succinate  25 mg Oral QHS  . multivitamin  1 tablet Oral QHS  . ondansetron      . pantoprazole  40 mg Oral Daily  . sodium chloride  3 mL Intravenous Q12H   Continuous Infusions:  PRN Meds:.sodium chloride, sodium chloride, acetaminophen **OR** acetaminophen, cyclobenzaprine, loperamide, ondansetron **OR** ondansetron (ZOFRAN) IV, oxyCODONE-acetaminophen, polyethylene glycol, trimethobenzamide Assessment/Plan: Pansensitive Pseudomonas Catheter Line Blood Stream Infection - Pt afebrile overnight with resolved leukocytosis. Patient is hemodynamically stable and may be appropriate for transfer out of intensive care discontinuation of central line if remains  in good condition tomorrow. -F/U repeat blood cultures to ensure clearance  -Continue ceftazidime abx Day 5/14 -Monitor fever curve and CBC daily  Hypotension - no longer requiring pressor support. We'll continue to hold medicines besides beta blocker at this time. 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 Location is not consistent with new radiographic finding locations on MRI. Current plan to hold anticoagulation until after current treatment for infection. 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 Limited TEE on 10/28 with no evidence of vegatations. 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 -Appreciate nephrology following -Continue rena-vit daily  -Monitor renal function  -Daily weights and strict I & O's -May need VVS consultation for  discussion of shuntogram due to chronic left thigh edema   Anemia of Chronic Disease - ESRD and SLE. Not actively bleeding anywhere. -Continue aranesp to maintain Hg >10 -Monitor CBC and transfuse if Hg <7  SLE: Continue home plaquenil 400 mg daily Hyperlipidemia: Continue home lipitor 20 mg daily  OSA: CPAP as tolerated at night GERD: Continue protonix 40 mg daily   Diet: Renal with 1200 mL restriction  DVT ppx: Start SQ heparin TID FULL CODE   Dispo: Disposition is deferred at this time, awaiting improvement of  current medical problems.    The patient does have a current PCP Lin Landsman,  MD) and does need an [facility] hospital follow-up appointment after discharge.  The patient does have transportation limitations that hinder transportation to clinic appointments.   LOS: 4 days   Collier Salina, MD 01/29/2015, 4:37 PM

## 2015-01-29 NOTE — Progress Notes (Signed)
PT Cancellation Note  Patient Details Name: [name] MRN: [id number] DOB: 1975-07-16   Cancelled Treatment:    Reason Eval/Treat Not Completed: Patient at procedure or test/unavailable.  Pt has recently gone to HD.  Will see as able 11/2. 01/29/2015  Donnella Sham, [place] [phone]  (pager)   Donnella Morford, Tessie Fass 01/29/2015, 4:37 PM

## 2015-01-29 NOTE — Progress Notes (Signed)
  [facility] Progress Note   Subjective: no more fevers, some dec'd appetite , no SOB or cough. BP's soft.   Filed Vitals:   01/28/15 2000 01/28/15 2323 01/29/15 0323 01/29/15 0814  BP:  108/81 108/81 99/59  Pulse: 110 104 105 105  Temp:  98 F (36.7 C) 98 F (36.7 C) 98.5 F (36.9 C)  TempSrc:  Oral Oral Oral  Resp: 19 20 18 14   Height:      Weight:   95.4 kg (210 lb 5.1 oz)   SpO2: 100% 98% 97% 94%   Exam: Alert, cushingoid, frail/ weak but no distress No jvd Chest clear on L, dec'd R base RRR no MRG Abd soft obese ntnd +bs L thigh1-2+ edema, R thigh 1+ edema, good pedal pulses L thigh AVG +bruit Neuro gen weak, nonfocal  ECHO 01/15/15 normal LVEF, RV appears normal  [facility]   4h  400800   87.5kg  2K bath  L thigh AVG placed 10/7 (R thigh cath pulled) Hep 2600 No Fe or vit D Aranesp 200/wk      Assessment: 1. Pseudomonas cath sepsis - afebrile now, s/p cath removal (10/29) 2. ESRD recent start, OK to use AVG per vasc surg 3. Hx MV endocarditis, s/p course of vanc 4. Hypotension - prob d/t #1, improved a bit 5. 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 SLE on plaguenil 8. Anemia on max darbe , transfusing prn 9. Hx SVT on metoprolol for this 25 mg qhs  Plan - HD today, use AVG. Transfer to floor/tele bed?   Kelly Splinter MD [facility] pager [phone]    cell [phone] 01/29/2015, 11:48 AM    Recent Labs Lab 01/27/15 1634 01/28/15 0110 01/29/15 0228  NA 131* 136 134*  K 2.9* 3.4* 3.8  CL 96* 101 104  CO2 25 24 23   GLUCOSE 64* 67 66  BUN 16 18 24*  CREATININE 5.10* 5.61* 6.55*  CALCIUM 6.9* 7.5* 7.6*  PHOS 1.6* 1.6* 2.1*    Recent Labs Lab 01/27/15 1634 01/28/15 0110 01/29/15 0228  ALBUMIN 1.4* 1.4* 1.3*    Recent Labs Lab 01/25/15 0405  01/27/15 0525 01/28/15 0110 01/29/15 0228  WBC 16.2*  < > 7.3 5.9 6.5  NEUTROABS 15.3*  --   --   --   --   HGB 8.9*  < > 7.8* 8.9* 10.4*   HCT 27.0*  < > 24.2* 27.1* 31.3*  MCV 91.8  < > 90.6 90.9 89.4  PLT 187  < > 153 160 152  < > = values in this interval not displayed. . sodium chloride   Intravenous Once  . atorvastatin  20 mg Oral q1800  . [START ON 01/31/2015] darbepoetin (ARANESP) injection - DIALYSIS  200 mcg Intravenous Q Thu-HD  . heparin subcutaneous  5,000 Units Subcutaneous 3 times per day  . hydroxychloroquine  400 mg Oral Daily  . metoprolol succinate  25 mg Oral QHS  . multivitamin  1 tablet Oral QHS  . pantoprazole  40 mg Oral Daily  . sodium chloride  3 mL Intravenous Q12H     sodium chloride, sodium chloride, acetaminophen **OR** acetaminophen, cyclobenzaprine, loperamide, ondansetron **OR** ondansetron (ZOFRAN) IV, oxyCODONE-acetaminophen, polyethylene glycol, trimethobenzamide

## 2015-01-29 NOTE — Progress Notes (Signed)
Pt. Refused cpap. RT informed pt. To notify if she changes her mind. 

## 2015-01-29 NOTE — Progress Notes (Signed)
Hemodialysis- Pt tolerated fair. Unable to UF d/t low bp and pt being symptomatic. Total UF =0.5L. No meds ordered. Report called to RN on [place].

## 2015-01-30 ENCOUNTER — Inpatient Hospital Stay (HOSPITAL_COMMUNITY): Payer: Medicaid Other

## 2015-01-30 ENCOUNTER — Encounter (HOSPITAL_COMMUNITY): Payer: Self-pay | Admitting: *Deleted

## 2015-01-30 ENCOUNTER — Encounter (HOSPITAL_COMMUNITY)
Admission: EM | Disposition: A | Discharge status: Rehabilitation Facility | Payer: Self-pay | Source: Home / Self Care | Attending: Infectious Diseases

## 2015-01-30 DIAGNOSIS — I288 Other diseases of pulmonary vessels: Secondary | ICD-10-CM

## 2015-01-30 HISTORY — PX: TEE WITHOUT CARDIOVERSION: SHX5443

## 2015-01-30 LAB — RENAL FUNCTION PANEL
ALBUMIN: 1.2 g/dL — AB (ref 3.5–5.0)
Anion gap: 7 (ref 5–15)
BUN: 16 mg/dL (ref 6–20)
CO2: 28 mmol/L (ref 22–32)
CREATININE: 4.82 mg/dL — AB (ref 0.44–1.00)
Calcium: 7.4 mg/dL — ABNORMAL LOW (ref 8.9–10.3)
Chloride: 95 mmol/L — ABNORMAL LOW (ref 101–111)
GFR calc Af Amer: 12 mL/min — ABNORMAL LOW (ref >60)
GFR, EST NON AFRICAN AMERICAN: 10 mL/min — AB (ref >60)
GLUCOSE: 67 mg/dL (ref 65–99)
PHOSPHORUS: 1.9 mg/dL — AB (ref 2.5–4.6)
Potassium: 3.6 mmol/L (ref 3.5–5.1)
SODIUM: 130 mmol/L — AB (ref 135–145)

## 2015-01-30 LAB — CBC
HEMATOCRIT: 29.4 % — AB (ref 36.0–46.0)
Hemoglobin: 9.8 g/dL — ABNORMAL LOW (ref 12.0–15.0)
MCH: 30.3 pg (ref 26.0–34.0)
MCHC: 33.3 g/dL (ref 30.0–36.0)
MCV: 91 fL (ref 78.0–100.0)
PLATELETS: 77 10*3/uL — AB (ref 150–400)
RBC: 3.23 MIL/uL — ABNORMAL LOW (ref 3.87–5.11)
RDW: 18.1 % — AB (ref 11.5–15.5)
WBC: 6.4 10*3/uL (ref 4.0–10.5)

## 2015-01-30 LAB — CATH TIP CULTURE

## 2015-01-30 LAB — CULTURE, BLOOD (ROUTINE X 2): CULTURE: NO GROWTH

## 2015-01-30 SURGERY — ECHOCARDIOGRAM, TRANSESOPHAGEAL
Anesthesia: Moderate Sedation

## 2015-01-30 MED ORDER — FENTANYL CITRATE (PF) 100 MCG/2ML IJ SOLN
INTRAMUSCULAR | Status: AC
  Filled 2015-01-30: qty 2

## 2015-01-30 MED ORDER — HEPARIN SODIUM (PORCINE) 1000 UNIT/ML DIALYSIS: 1000.0000 [IU] | INTRAMUSCULAR | Status: DC | PRN

## 2015-01-30 MED ORDER — LIDOCAINE HCL (PF) 1 % IJ SOLN: 5.0000 mL | INTRAMUSCULAR | Status: DC | PRN

## 2015-01-30 MED ORDER — SODIUM CHLORIDE 0.9 % IV SOLN: 100.0000 mL | INTRAVENOUS | Status: DC | PRN

## 2015-01-30 MED ORDER — FONDAPARINUX SODIUM 2.5 MG/0.5ML ~~LOC~~ SOLN: 2.5000 mg | Freq: Every day | SUBCUTANEOUS | Status: DC

## 2015-01-30 MED ORDER — HEPARIN SODIUM (PORCINE) 1000 UNIT/ML DIALYSIS
2600.0000 [IU] | Freq: Once | INTRAMUSCULAR | Status: AC
  Administered 2015-02-02: 2600 [IU] via INTRAVENOUS_CENTRAL

## 2015-01-30 MED ORDER — MIDAZOLAM HCL 10 MG/2ML IJ SOLN
INTRAMUSCULAR | Status: DC | PRN
  Administered 2015-01-30: 1 mg via INTRAVENOUS
  Administered 2015-01-30: 2 mg via INTRAVENOUS

## 2015-01-30 MED ORDER — LIDOCAINE-PRILOCAINE 2.5-2.5 % EX CREA: 1.0000 "application " | TOPICAL_CREAM | CUTANEOUS | Status: DC | PRN

## 2015-01-30 MED ORDER — DEXTROSE 5 % IV SOLN
2.0000 g | INTRAVENOUS | Status: DC
  Administered 2015-01-31 – 2015-02-02 (×2): 2 g via INTRAVENOUS
  Filled 2015-01-30 (×4): qty 2

## 2015-01-30 MED ORDER — SODIUM CHLORIDE 0.9 % IV SOLN: INTRAVENOUS | Status: DC

## 2015-01-30 MED ORDER — BUTAMBEN-TETRACAINE-BENZOCAINE 2-2-14 % EX AERO
INHALATION_SPRAY | CUTANEOUS | Status: DC | PRN
  Administered 2015-01-30: 2 via TOPICAL

## 2015-01-30 MED ORDER — PENTAFLUOROPROP-TETRAFLUOROETH EX AERO: 1.0000 "application " | INHALATION_SPRAY | CUTANEOUS | Status: DC | PRN

## 2015-01-30 MED ORDER — DEXTROSE 5 % IV SOLN
2.0000 g | INTRAVENOUS | Status: AC
  Administered 2015-01-30: 2 g via INTRAVENOUS
  Filled 2015-01-30 (×2): qty 2

## 2015-01-30 MED ORDER — ALTEPLASE 2 MG IJ SOLR
2.0000 mg | Freq: Once | INTRAMUSCULAR | Status: DC | PRN
  Filled 2015-01-30: qty 2

## 2015-01-30 MED ORDER — MIDAZOLAM HCL 5 MG/ML IJ SOLN
INTRAMUSCULAR | Status: AC
  Filled 2015-01-30: qty 2

## 2015-01-30 MED ORDER — FENTANYL CITRATE (PF) 100 MCG/2ML IJ SOLN
INTRAMUSCULAR | Status: DC | PRN
  Administered 2015-01-30 (×2): 25 ug via INTRAVENOUS

## 2015-01-30 NOTE — Progress Notes (Signed)
  [facility] Progress Note   Subjective: no new complaints. Gets up and walks w walker but not yest . Using bedpan  Filed Vitals:   01/29/15 2231 01/29/15 2354 01/30/15 0345 01/30/15 0757  BP: 89/58 88/57 85/55  104/69  Pulse: 120 112 105 102  Temp:  98 F (36.7 C) 98.5 F (36.9 C) 98.5 F (36.9 C)  TempSrc:  Oral Oral Oral  Resp:  22 19 14   Height:      Weight:   94.348 kg (208 lb)   SpO2:  98% 96% 98%   Exam: Alert, cushingoid, frail/ weak but no distress No jvd Chest clear on L, dec'd R base RRR no MRG Abd soft obese ntnd +bs L thigh1-2+ edema, R thigh 1+ edema, good pedal pulses L thigh AVG +bruit Neuro gen weak, nonfocal  ECHO 01/15/15 normal LVEF, RV appears normal  [facility]   4h  400800   87.5kg  2K bath  L thigh AVG placed 10/7 (R thigh cath pulled) Hep 2600 No Fe or vit D Aranesp 200/wk      Assessment: 1. Pseudomonas cath sepsis - afebrile now, s/p cath removal (10/29) 2. ESRD recent start, OK to use AVG per vasc surg 3. Hx MV endocarditis, s/p Abx 4. Hypotension - prob d/t #1, improving 5. 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 SLE on plaguenil 8. Anemia on max darbe , transfusing prn 9. Hx SVT on metoprolol for this 25 mg qhs  Plan - HD tomorrow, using AVG now   Kelly Splinter MD [place] pager [phone]    cell [phone] 01/30/2015, 11:02 AM    Recent Labs Lab 01/28/15 0110 01/29/15 0228 01/30/15 0308  NA 136 134* 130*  K 3.4* 3.8 3.6  CL 101 104 95*  CO2 24 23 28   GLUCOSE 67 66 67  BUN 18 24* 16  CREATININE 5.61* 6.55* 4.82*  CALCIUM 7.5* 7.6* 7.4*  PHOS 1.6* 2.1* 1.9*    Recent Labs Lab 01/28/15 0110 01/29/15 0228 01/30/15 0308  ALBUMIN 1.4* 1.3* 1.2*    Recent Labs Lab 01/25/15 0405  01/28/15 0110 01/29/15 0228 01/30/15 0308  WBC 16.2*  < > 5.9 6.5 6.4  NEUTROABS 15.3*  --   --   --   --   HGB 8.9*  < > 8.9* 10.4* 9.8*  HCT 27.0*  < > 27.1*  31.3* 29.4*  MCV 91.8  < > 90.9 89.4 91.0  PLT 187  < > 160 152 77*  < > = values in this interval not displayed. . sodium chloride   Intravenous Once  . atorvastatin  20 mg Oral q1800  . cefTAZidime (FORTAZ)  IV  2 g Intravenous NOW  . [START ON 01/31/2015] cefTAZidime (FORTAZ)  IV  2 g Intravenous Q T,Th,Sa-HD  . [START ON 01/31/2015] darbepoetin (ARANESP) injection - DIALYSIS  200 mcg Intravenous Q Thu-HD  . heparin subcutaneous  5,000 Units Subcutaneous 3 times per day  . hydroxychloroquine  400 mg Oral Daily  . metoprolol succinate  25 mg Oral QHS  . multivitamin  1 tablet Oral QHS  . pantoprazole  40 mg Oral Daily  . sodium chloride  3 mL Intravenous Q12H     sodium chloride, sodium chloride, acetaminophen **OR** acetaminophen, cyclobenzaprine, loperamide, ondansetron **OR** ondansetron (ZOFRAN) IV, oxyCODONE-acetaminophen, polyethylene glycol, trimethobenzamide

## 2015-01-30 NOTE — Progress Notes (Signed)
PT Cancellation Note  Patient Details Name: [name] MRN: [id number] DOB: 1976/01/26   Cancelled Treatment:    Reason Eval/Treat Not Completed: Patient at procedure or test/unavailable (Going for TEE as this PT arrived to room.  Will check back tomorrow.)  Thanks.    Irwin Brakeman F 01/30/2015, 12:07 PM Joachim Carton,PT Acute Rehabilitation [phone] (pager)

## 2015-01-30 NOTE — CV Procedure (Signed)
See full TEE report in [facility]; normal LV function; no vegetations noted. Kirk Ruths

## 2015-01-30 NOTE — Interval H&P Note (Signed)
History and Physical Interval Note:  01/30/2015 11:43 AM  [name]  has presented today for surgery, with the diagnosis of bacteremia  The various methods of treatment have been discussed with the patient and family. After consideration of risks, benefits and other options for treatment, the patient has consented to  Procedure(s): TRANSESOPHAGEAL ECHOCARDIOGRAM (TEE) (N/A) as a surgical intervention .  The patient's history has been reviewed, patient examined, no change in status, stable for surgery.  I have reviewed the patient's chart and labs.  Questions were answered to the patient's satisfaction.     Kirk Ruths

## 2015-01-30 NOTE — Progress Notes (Signed)
  Date: 01/30/2015  Patient name: [name]  Medical record number: [id number]  Date of birth: March 02, 1976   [name] plan of care was discussed with the house staff. Please see their note for complete details. I concur with their findings. 1. Pseudomonas sepsis  Improving, on ceftaz with HD  Repeat BCx 10-31 are ngtd  Out of step down today  2. Thrombocytopenia  Will change heparin to LMWH or other non-heparin product (fonduparinox)  Repeat PLT # in AM  3. MCA embiolus, perv murantic endocarditis  TEE today.   4. ESRD  Will continue to follow K+  Appreciate renal f/u  Campbell Riches, MD 01/30/2015, 10:38 AM

## 2015-01-30 NOTE — Progress Notes (Signed)
Utilization review completed.  

## 2015-01-30 NOTE — Evaluation (Signed)
Occupational Therapy Evaluation Patient Details Name: [name] MRN: [id number] DOB: 1976-02-14 Today's Date: 01/30/2015    History of Present Illness  This 39 y.o female addmitted with fevers and AMS likely due to Sepsis from UTI.  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 She demonstrates the below listed deficits and will benefit from continued OT to maximize safety and independence with BADLs.  Pt presents to OT with generalized weakness.  She requires mod - max A overall for ADLs.  She moves very slowly and fatigues with activity.  She is tearful during session, and reports significant frustration with situation.  Feel best recommendation for his is CIR as she will require extensive assistance at home.  She, however, is insistent on discharging home stating she's been in the hospital too much lately.   IF she discharges straight  home, recommend w/c and hospital bed.       Follow Up Recommendations  CIR    Equipment Recommendations  Tub/shower bench;Wheelchair (measurements OT);Hospital bed    Recommendations for Other Services Rehab consult     Precautions / Restrictions Precautions Precautions: Fall      Mobility Bed Mobility Overal bed mobility: Needs Assistance Bed Mobility: Supine to Sit;Sit to Supine     Supine to sit: HOB elevated;Max assist Sit to supine: Mod assist   General bed mobility comments: moves slowly.  requires assist to move LEs off EOB and assist to lift shoulders.  She is very fearful of falling.  Requires assist to lift LEs onto bed when returning to supine   Transfers Overall transfer level: Needs assistance Equipment used: 1 person hand held assist Transfers: Sit to/from Stand Sit to Stand: Mod assist         General transfer comment: Assist to power up into standing, and assist for balance     Balance  Overall balance assessment: Needs assistance Sitting-balance support: Feet supported Sitting balance-Leahy Scale: Poor Sitting balance - Comments: varies between min guard assist and min A.  Posterior lean as she fatigues requiring min A to recover  Postural control: Posterior lean Standing balance support: Bilateral upper extremity supported Standing balance-Leahy Scale: Poor                              ADL Overall ADL's : Needs assistance/impaired Eating/Feeding: Modified independent;Bed level   Grooming: Wash/dry face;Wash/dry hands;Oral care;Brushing hair;Minimal assistance;Sitting   Upper Body Bathing: Moderate assistance;Sitting   Lower Body Bathing: Maximal assistance;Sit to/from stand   Upper Body Dressing : Moderate assistance;Sitting   Lower Body Dressing: Total assistance;Sit to/from stand   Toilet Transfer: Moderate assistance;Stand-pivot;BSC   Toileting- Clothing Manipulation and Hygiene: Total assistance       Functional mobility during ADLs: Moderate assistance General ADL Comments: Pt fagitues quickly.  She is tearful during eval, question how much depression may be impacting performance.  She states "i'm just tired at this point", "every time I think I'm getting better something else happens"  Pt is very fearful of falling      Vision Vision Assessment?: No apparent visual deficits   Perception Perception Perception Tested?: Yes   Praxis Praxis Praxis tested?: Within functional limits    Pertinent Vitals/Pain Pain Assessment: Faces Faces Pain Scale: Hurts a little bit Pain Location: Lt thigh  Pain Descriptors / Indicators: Grimacing Pain Intervention(s): Monitored during session  Hand Dominance Right   Extremity/Trunk Assessment Upper Extremity Assessment Upper Extremity Assessment: Generalized weakness;LUE deficits/detail LUE Deficits / Details: AROM shoulder flexion 0-110 degrees, all other joints AROM WFLS.  Strength 3+/5 in  shoulder, elbow, and grip   Lower Extremity Assessment Lower Extremity Assessment: Defer to PT evaluation   Cervical / Trunk Assessment Cervical / Trunk Assessment: Other exceptions Cervical / Trunk Exceptions: Pt wtih poor trunk control.  Maintains head/neck and trunk in flexed position    Communication Communication Communication: No difficulties   Cognition Arousal/Alertness: Awake/alert Behavior During Therapy: Flat affect;Anxious Overall Cognitive Status: Within Functional Limits for tasks assessed                     General Comments       Exercises       Shoulder Instructions      Home Living Family/patient expects to be discharged to:: Private residence Living Arrangements: Children;Parent (6 and 15 y.o children ) Available Help at Discharge: Family;Available 24 hours/day Type of Home: Apartment Home Access: Level entry     Home Layout: One level     Bathroom Shower/Tub: Tub/shower unit;Curtain Shower/tub characteristics: Architectural technologist: Standard Bathroom Accessibility: Yes How Accessible: Accessible via walker;Accessible via wheelchair Home Equipment: Bedside commode;Walker - standard          Prior Functioning/Environment Level of Independence: Needs assistance  Gait / Transfers Assistance Needed: mod I with RW ADL's / Homemaking Assistance Needed: Requires assist with IADL   Comments: prior to admission 10/16, pt was fully independent     OT Diagnosis: Generalized weakness;Acute pain   OT Problem List: Decreased strength;Decreased activity tolerance;Impaired balance (sitting and/or standing);Decreased safety awareness;Decreased knowledge of use of DME or AE;Cardiopulmonary status limiting activity;Obesity;Pain;Impaired UE functional use   OT Treatment/Interventions: Self-care/ADL training;Therapeutic exercise;Neuromuscular education;DME and/or AE instruction;Therapeutic activities;Cognitive remediation/compensation;Patient/family  education;Balance training    OT Goals(Current goals can be found in the care plan section) Acute Rehab OT Goals Patient Stated Goal: go home  OT Goal Formulation: With patient Time For Goal Achievement: 02/13/15 Potential to Achieve Goals: Good ADL Goals Pt Will Perform Grooming: with supervision;sitting (unsupported) Pt Will Perform Upper Body Bathing: with set-up;with supervision;sitting (unsupported) Pt Will Perform Lower Body Bathing: with mod assist;sit to/from stand Pt Will Perform Upper Body Dressing: with supervision;sitting Pt Will Transfer to Toilet: with min assist;ambulating;regular height toilet;bedside commode;grab bars Pt/caregiver will Perform Home Exercise Program: Increased strength;Right Upper extremity;Left upper extremity;With written HEP provided  OT Frequency: Min 3X/week   Barriers to D/C:            Co-evaluation              End of Session Nurse Communication: Mobility status  Activity Tolerance: Patient limited by fatigue Patient left: in bed;with call bell/phone within reach;with bed alarm set   Time: 9562-1308 OT Time Calculation (min): 49 min Charges:  OT General Charges $OT Visit: 1 Procedure OT Evaluation $Initial OT Evaluation Tier I: 1 Procedure OT Treatments $Therapeutic Activity: 23-37 mins G-Codes:    Ligia Duguay M 2015-02-23, 5:12 PM

## 2015-01-30 NOTE — H&P (View-Only) (Signed)
  [facility] Progress Note   Subjective: no new complaints. Gets up and walks w walker but not yest . Using bedpan  Filed Vitals:   01/29/15 2231 01/29/15 2354 01/30/15 0345 01/30/15 0757  BP: 89/58 88/57 85/55  104/69  Pulse: 120 112 105 102  Temp:  98 F (36.7 C) 98.5 F (36.9 C) 98.5 F (36.9 C)  TempSrc:  Oral Oral Oral  Resp:  22 19 14   Height:      Weight:   94.348 kg (208 lb)   SpO2:  98% 96% 98%   Exam: Alert, cushingoid, frail/ weak but no distress No jvd Chest clear on L, dec'd R base RRR no MRG Abd soft obese ntnd +bs L thigh1-2+ edema, R thigh 1+ edema, good pedal pulses L thigh AVG +bruit Neuro gen weak, nonfocal  ECHO 01/15/15 normal LVEF, RV appears normal  [facility]   4h  400800   87.5kg  2K bath  L thigh AVG placed 10/7 (R thigh cath pulled) Hep 2600 No Fe or vit D Aranesp 200/wk      Assessment: 1. Pseudomonas cath sepsis - afebrile now, s/p cath removal (10/29) 2. ESRD recent start, OK to use AVG per vasc surg 3. Hx MV endocarditis, s/p Abx 4. Hypotension - prob d/t #1, improving 5. 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 SLE on plaguenil 8. Anemia on max darbe , transfusing prn 9. Hx SVT on metoprolol for this 25 mg qhs  Plan - HD tomorrow, using AVG now   Kelly Splinter MD [place] pager [phone]    cell [phone] 01/30/2015, 11:02 AM    Recent Labs Lab 01/28/15 0110 01/29/15 0228 01/30/15 0308  NA 136 134* 130*  K 3.4* 3.8 3.6  CL 101 104 95*  CO2 24 23 28   GLUCOSE 67 66 67  BUN 18 24* 16  CREATININE 5.61* 6.55* 4.82*  CALCIUM 7.5* 7.6* 7.4*  PHOS 1.6* 2.1* 1.9*    Recent Labs Lab 01/28/15 0110 01/29/15 0228 01/30/15 0308  ALBUMIN 1.4* 1.3* 1.2*    Recent Labs Lab 01/25/15 0405  01/28/15 0110 01/29/15 0228 01/30/15 0308  WBC 16.2*  < > 5.9 6.5 6.4  NEUTROABS 15.3*  --   --   --   --   HGB 8.9*  < > 8.9* 10.4* 9.8*  HCT 27.0*  < > 27.1*  31.3* 29.4*  MCV 91.8  < > 90.9 89.4 91.0  PLT 187  < > 160 152 77*  < > = values in this interval not displayed. . sodium chloride   Intravenous Once  . atorvastatin  20 mg Oral q1800  . cefTAZidime (FORTAZ)  IV  2 g Intravenous NOW  . [START ON 01/31/2015] cefTAZidime (FORTAZ)  IV  2 g Intravenous Q T,Th,Sa-HD  . [START ON 01/31/2015] darbepoetin (ARANESP) injection - DIALYSIS  200 mcg Intravenous Q Thu-HD  . heparin subcutaneous  5,000 Units Subcutaneous 3 times per day  . hydroxychloroquine  400 mg Oral Daily  . metoprolol succinate  25 mg Oral QHS  . multivitamin  1 tablet Oral QHS  . pantoprazole  40 mg Oral Daily  . sodium chloride  3 mL Intravenous Q12H     sodium chloride, sodium chloride, acetaminophen **OR** acetaminophen, cyclobenzaprine, loperamide, ondansetron **OR** ondansetron (ZOFRAN) IV, oxyCODONE-acetaminophen, polyethylene glycol, trimethobenzamide

## 2015-01-30 NOTE — Progress Notes (Signed)
  Echocardiogram Echocardiogram Transesophageal has been performed.  [name] 01/30/2015, 1:01 PM

## 2015-01-30 NOTE — Progress Notes (Signed)
Occupational Therapy Progress note  Instructed pt in methods for bed mobility as she makes very little attempt to reposition or turn self.  She appears very apathetic (? Depression).   Recommend CIR prior to discharge home.     01/30/15 1709  OT Visit Information  Last OT Received On 01/30/15  Assistance Needed +2  History of Present Illness This 39 y.o female addmitted with fevers and AMS likely due to Sepsis from UTI. 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  Pt instructed in correct methods for bed mobillity and able to roll to her rt with min A, pillows placed to assist with positioning.  Pt requires encouragement for max participation.   OT - End of Session  Activity Tolerance Patient limited by fatigue  Patient left in bed;with call bell/phone within reach;with bed alarm set  Nurse Communication Mobility status  OT Assessment/Plan  OT Plan Discharge plan remains appropriate  OT Frequency (ACUTE ONLY) Min 3X/week  Recommendations for Other Services Rehab consult  Follow Up Recommendations CIR  OT Equipment Tub/shower bench;Wheelchair (measurements OT);Hospital bed  OT Goal Progression  Progress towards OT goals Progressing toward goals  ADL Goals  Pt Will Perform Grooming with supervision;sitting (unsupported)  Pt Will Perform Upper Body Bathing with set-up;with supervision;sitting (unsupported)  Pt  Will Perform Lower Body Bathing with mod assist;sit to/from stand  Pt Will Perform Upper Body Dressing with supervision;sitting  Pt Will Transfer to Toilet with min assist;ambulating;regular height toilet;bedside commode;grab bars  Pt/caregiver will Perform Home Exercise Program Increased strength;Right Upper extremity;Left upper extremity;With written HEP provided  OT General Charges  $OT Visit 1 Procedure  OT Treatments  $Therapeutic Activity 8-22 mins  [facility], OTR/L [phone]

## 2015-01-30 NOTE — Progress Notes (Signed)
Subjective: Hemodialysis yesterday limited by hypotension and shortness of breath with volume removal. Total uF volume was limited to 553mL. Otherwise she is feeling well this morning without new complaint. NPO for TEE scheduled for noon.  Objective: Vital signs in last 24 hours: Filed Vitals:   01/29/15 2231 01/29/15 2354 01/30/15 0345 01/30/15 0757  BP: 89/58 88/57 85/55  104/69  Pulse: 120 112 105 102  Temp:  98 F (36.7 C) 98.5 F (36.9 C) 98.5 F (36.9 C)  TempSrc:  Oral Oral Oral  Resp:  22 19 14   Height:      Weight:   94.348 kg (208 lb)   SpO2:  98% 96% 98%   Weight change: -3.138 kg (-6 lb 14.7 oz)  Intake/Output Summary (Last 24 hours) at 01/30/15 1057 Last data filed at 01/30/15 0100  Gross per 24 hour  Intake    220 ml  Output    500 ml  Net   -280 ml   GENERAL- alert, co-operative, NAD HEENT- Oral mucosa appears moist CARDIAC- regular rhythm, tachycardic, distant heart sounds RESP- CTAB, no wheezes or crackles. ABDOMEN- Soft, nontender, no guarding or rebound NEURO- No obvious Cr N abnormality, strength upper extremities 5 out of 5, right lower extremity 5 out of 5, left lower extremity 4 out of 5 in hip flexion, knee extension, foot dorsiflexion. EXTREMITIES- LLE AVG with palpable thrill PSYCH- Normal mood and affect, appropriate thought content and speech.  Lab Results: Basic Metabolic Panel:  Recent Labs Lab 01/29/15 0228 01/30/15 0308  NA 134* 130*  K 3.8 3.6  CL 104 95*  CO2 23 28  GLUCOSE 66 67  BUN 24* 16  CREATININE 6.55* 4.82*  CALCIUM 7.6* 7.4*  PHOS 2.1* 1.9*   Liver Function Tests:  Recent Labs Lab 01/29/15 0228 01/30/15 0308  ALBUMIN 1.3* 1.2*   No results for input(s): LIPASE, AMYLASE in the last 168 hours. No results for input(s): AMMONIA in the last 168 hours. CBC:  Recent Labs Lab 01/25/15 0405  01/29/15 0228 01/30/15 0308  WBC 16.2*  < > 6.5 6.4  NEUTROABS 15.3*  --   --   --   HGB 8.9*  < > 10.4* 9.8*  HCT  27.0*  < > 31.3* 29.4*  MCV 91.8  < > 89.4 91.0  PLT 187  < > 152 77*  < > = values in this interval not displayed. Cardiac Enzymes:  Recent Labs Lab 01/25/15 1918 01/26/15 0109 01/26/15 0723  TROPONINI 0.21* 0.18* 0.29*   BNP: No results for input(s): PROBNP in the last 168 hours. D-Dimer: No results for input(s): DDIMER in the last 168 hours. CBG:  Recent Labs Lab 01/25/15 2032 01/25/15 2322 01/26/15 0255 01/27/15 0752 01/27/15 0827 01/27/15 0915  GLUCAP 101* 84 78 44* 66 73   Hemoglobin A1C: No results for input(s): HGBA1C in the last 168 hours. Fasting Lipid Panel: No results for input(s): CHOL, HDL, LDLCALC, TRIG, CHOLHDL, LDLDIRECT in the last 168 hours. Thyroid Function Tests: No results for input(s): TSH, T4TOTAL, FREET4, T3FREE, THYROIDAB in the last 168 hours. Coagulation:  Recent Labs Lab 01/26/15 0057 01/26/15 0723 01/27/15 0525 01/28/15 0110  LABPROT 21.6* 22.9* 24.4* 24.3*  INR 1.89* 2.04* 2.22* 2.21*   Anemia Panel:  Recent Labs Lab 01/26/15 0109  FERRITIN 1323*  TIBC NOT CALCULATED  IRON 21*   Urine Drug Screen: Drugs of Abuse     Component Value Date/Time   LABOPIA NONE DETECTED 07/21/2012 0215   COCAINSCRNUR NONE DETECTED  07/21/2012 0215   LABBENZ NONE DETECTED 07/21/2012 0215   AMPHETMU NONE DETECTED 07/21/2012 0215   THCU NONE DETECTED 07/21/2012 0215   LABBARB NONE DETECTED 07/21/2012 0215    Alcohol Level: No results for input(s): ETH in the last 168 hours. Urinalysis:  Recent Labs Lab 01/25/15 0513  COLORURINE YELLOW  LABSPEC 1.033*  PHURINE 6.0  GLUCOSEU NEGATIVE  HGBUR LARGE*  BILIRUBINUR SMALL*  KETONESUR 15*  PROTEINUR >300*  UROBILINOGEN 0.2  NITRITE POSITIVE*  LEUKOCYTESUR MODERATE*    Micro Results: Recent Results (from the past 240 hour(s))  Culture, blood (routine x 2)     Status: None (Preliminary result)   Collection Time: 01/25/15  4:05 AM  Result Value Ref Range Status   Specimen Description  BLOOD RIGHT HAND  Final   Special Requests BOTTLES DRAWN AEROBIC AND ANAEROBIC 5CC  Final   Culture NO GROWTH 4 DAYS  Final   Report Status PENDING  Incomplete  Culture, blood (routine x 2)     Status: None   Collection Time: 01/25/15  4:34 AM  Result Value Ref Range Status   Specimen Description BLOOD LEFT ARM  Final   Special Requests BOTTLES DRAWN AEROBIC ONLY 10CC  Final   Culture  Setup Time   Final    GRAM NEGATIVE RODS AEROBIC BOTTLE ONLY CRITICAL RESULT CALLED TO, READ BACK BY AND VERIFIED WITH: S HERBERT,RN AT 1351 01/26/15 BY L BENFIELD    Culture   Final    PSEUDOMONAS AERUGINOSA SUSCEPTIBILITIES PERFORMED ON PREVIOUS CULTURE WITHIN THE LAST 5 DAYS.    Report Status 01/28/2015 FINAL  Final  Urine culture     Status: None   Collection Time: 01/25/15  5:13 AM  Result Value Ref Range Status   Specimen Description URINE, CATHETERIZED  Final   Special Requests NONE  Final   Culture MULTIPLE SPECIES PRESENT, SUGGEST RECOLLECTION  Final   Report Status 01/26/2015 FINAL  Final  Culture, blood (routine x 2)     Status: None   Collection Time: 01/25/15  1:40 PM  Result Value Ref Range Status   Specimen Description BLOOD RIGHT FEMORAL ARTERY  Final   Special Requests BOTTLES DRAWN AEROBIC AND ANAEROBIC 10CC  Final   Culture  Setup Time   Final    GRAM NEGATIVE RODS AEROBIC BOTTLE ONLY CRITICAL RESULT CALLED TO, READ BACK BY AND VERIFIED WITH: Wyn Quaker RN 2106 01/25/15 A BROWNING    Culture PSEUDOMONAS AERUGINOSA  Final   Report Status 01/27/2015 FINAL  Final   Organism ID, Bacteria PSEUDOMONAS AERUGINOSA  Final      Susceptibility   Pseudomonas aeruginosa - MIC*    CEFTAZIDIME 4 SENSITIVE Sensitive     CIPROFLOXACIN 0.5 SENSITIVE Sensitive     GENTAMICIN <=1 SENSITIVE Sensitive     IMIPENEM 2 SENSITIVE Sensitive     PIP/TAZO 16 SENSITIVE Sensitive     CEFEPIME 8 SENSITIVE Sensitive     * PSEUDOMONAS AERUGINOSA  MRSA PCR Screening     Status: None   Collection Time:  01/25/15  8:35 PM  Result Value Ref Range Status   MRSA by PCR NEGATIVE NEGATIVE Final    Comment:        The GeneXpert MRSA Assay (FDA approved for NASAL specimens only), is one component of a comprehensive MRSA colonization surveillance program. It is not intended to diagnose MRSA infection nor to guide or monitor treatment for MRSA infections.   Cath Tip Culture     Status: None  Collection Time: 01/26/15  5:23 PM  Result Value Ref Range Status   Specimen Description CATH TIP RIGHT THIGH HEMODIALYSIS CATHETER  Final   Special Requests NONE  Final   Culture LT15 Performed at [facility]   Final   Report Status 01/30/2015 FINAL  Final  C difficile quick scan w PCR reflex     Status: None   Collection Time: 01/27/15  9:46 AM  Result Value Ref Range Status   C Diff antigen NEGATIVE NEGATIVE Final   C Diff toxin NEGATIVE NEGATIVE Final   C Diff interpretation Negative for toxigenic C. difficile  Final  Culture, blood (routine x 2)     Status: None (Preliminary result)   Collection Time: 01/28/15  1:42 PM  Result Value Ref Range Status   Specimen Description BLOOD LEFT HAND  Final   Special Requests IN PEDIATRIC BOTTLE  3CC  Final   Culture NO GROWTH < 24 HOURS  Final   Report Status PENDING  Incomplete   Studies/Results: No results found. Medications: I have reviewed the patient's current medications. Scheduled Meds: . sodium chloride   Intravenous Once  . atorvastatin  20 mg Oral q1800  . cefTAZidime (FORTAZ)  IV  2 g Intravenous NOW  . [START ON 01/31/2015] cefTAZidime (FORTAZ)  IV  2 g Intravenous Q T,Th,Sa-HD  . [START ON 01/31/2015] darbepoetin (ARANESP) injection - DIALYSIS  200 mcg Intravenous Q Thu-HD  . heparin subcutaneous  5,000 Units Subcutaneous 3 times per day  . hydroxychloroquine  400 mg Oral Daily  . metoprolol succinate  25 mg Oral QHS  . multivitamin  1 tablet Oral QHS  . pantoprazole  40 mg Oral Daily  . sodium chloride  3 mL Intravenous  Q12H   Continuous Infusions:  PRN Meds:.sodium chloride, sodium chloride, acetaminophen **OR** acetaminophen, cyclobenzaprine, loperamide, ondansetron **OR** ondansetron (ZOFRAN) IV, oxyCODONE-acetaminophen, polyethylene glycol, trimethobenzamide Assessment/Plan: Pansensitive Pseudomonas Catheter Line Blood Stream Infection - Continues to improve on therapy, without fever, hypotension, or other symptoms -F/U repeat Cx (NGTD) -Continue ceftazidime abx Day 6/14 -Monitor fever curve and CBC daily   Hypotension - Not requiring pressors, central access no longer required, can transfer, still holding antihypertensives besides toprol for tachyardia -Hold home imdur 30 mg  -Continue toprol-XL 25 mg -Fluid bolus if needed to keep MAP >65  Subacute right MCA & Acute right caudate head/lentiform nucleus 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 -Appreciate neurology recommendations  -Hold coumadin for now in setting of acute infection and reassess risk/benefit after completion of antibiotic therapy in 2 weeks -Stop heparin products, will anticoagulate with fondaparinux -PT and OT consults  Thrombocytopenia - Plts 77 down from stable 150s earlier in admission. Pt has previous history of pancytopenia with negative HIT panel from September of this year. 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 -Appreciate nephrology recs -Continue rena-vit daily -Monitor renal function  Anemia of Chronic Disease - ESRD and SLE. Not actively bleeding anywhere. -Continue aranesp  to maintain Hg >10 -Monitor CBC and transfuse if Hg <7  SLE: Continue home plaquenil 400 mg  daily Hyperlipidemia: Continue home lipitor 20 mg daily  OSA: CPAP as tolerated at night GERD: Continue protonix 40 mg daily  Diet: Renal with 1200 mL restriction  DVT ppx: SCDs FULL CODE  Dispo: Disposition is deferred at this time, awaiting improvement of current medical problems.    The patient does have a current PCP Lin Landsman, MD) and does need an [facility] hospital follow-up appointment after discharge.  The patient does have transportation limitations that hinder transportation to clinic appointments.   LOS: 5 days   Collier Salina, MD 01/30/2015, 10:57 AM

## 2015-01-31 ENCOUNTER — Encounter (HOSPITAL_COMMUNITY): Payer: Self-pay | Admitting: Cardiology

## 2015-01-31 DIAGNOSIS — M3214 Glomerular disease in systemic lupus erythematosus: Secondary | ICD-10-CM

## 2015-01-31 DIAGNOSIS — B965 Pseudomonas (aeruginosa) (mallei) (pseudomallei) as the cause of diseases classified elsewhere: Secondary | ICD-10-CM

## 2015-01-31 DIAGNOSIS — E876 Hypokalemia: Secondary | ICD-10-CM

## 2015-01-31 DIAGNOSIS — Z992 Dependence on renal dialysis: Secondary | ICD-10-CM

## 2015-01-31 DIAGNOSIS — D696 Thrombocytopenia, unspecified: Secondary | ICD-10-CM

## 2015-01-31 DIAGNOSIS — G4733 Obstructive sleep apnea (adult) (pediatric): Secondary | ICD-10-CM

## 2015-01-31 DIAGNOSIS — N186 End stage renal disease: Secondary | ICD-10-CM

## 2015-01-31 DIAGNOSIS — T80211A Bloodstream infection due to central venous catheter, initial encounter: Principal | ICD-10-CM

## 2015-01-31 DIAGNOSIS — E785 Hyperlipidemia, unspecified: Secondary | ICD-10-CM

## 2015-01-31 DIAGNOSIS — D631 Anemia in chronic kidney disease: Secondary | ICD-10-CM

## 2015-01-31 DIAGNOSIS — K219 Gastro-esophageal reflux disease without esophagitis: Secondary | ICD-10-CM

## 2015-01-31 DIAGNOSIS — R5381 Other malaise: Secondary | ICD-10-CM

## 2015-01-31 LAB — RENAL FUNCTION PANEL
ALBUMIN: 1.3 g/dL — AB (ref 3.5–5.0)
Anion gap: 7 (ref 5–15)
BUN: 20 mg/dL (ref 6–20)
CALCIUM: 7.8 mg/dL — AB (ref 8.9–10.3)
CO2: 27 mmol/L (ref 22–32)
CREATININE: 6.06 mg/dL — AB (ref 0.44–1.00)
Chloride: 103 mmol/L (ref 101–111)
GFR calc non Af Amer: 8 mL/min — ABNORMAL LOW (ref >60)
GFR, EST AFRICAN AMERICAN: 9 mL/min — AB (ref >60)
Glucose, Bld: 67 mg/dL (ref 65–99)
PHOSPHORUS: 2.6 mg/dL (ref 2.5–4.6)
Potassium: 3.4 mmol/L — ABNORMAL LOW (ref 3.5–5.1)
SODIUM: 137 mmol/L (ref 135–145)

## 2015-01-31 LAB — CBC
HCT: 28.8 % — ABNORMAL LOW (ref 36.0–46.0)
Hemoglobin: 9.3 g/dL — ABNORMAL LOW (ref 12.0–15.0)
MCH: 29.5 pg (ref 26.0–34.0)
MCHC: 32.3 g/dL (ref 30.0–36.0)
MCV: 91.4 fL (ref 78.0–100.0)
PLATELETS: 104 10*3/uL — AB (ref 150–400)
RBC: 3.15 MIL/uL — AB (ref 3.87–5.11)
RDW: 18.5 % — ABNORMAL HIGH (ref 11.5–15.5)
WBC: 4.9 10*3/uL (ref 4.0–10.5)

## 2015-01-31 LAB — TSH: TSH: 8.798 u[IU]/mL — ABNORMAL HIGH (ref 0.350–4.500)

## 2015-01-31 LAB — CORTISOL: Cortisol, Plasma: 13.2 ug/dL

## 2015-01-31 MED ORDER — MIDODRINE HCL 5 MG PO TABS: 10.0000 mg | ORAL_TABLET | Freq: Two times a day (BID) | ORAL | Status: DC

## 2015-01-31 MED ORDER — MIDODRINE HCL 5 MG PO TABS
ORAL_TABLET | ORAL | Status: AC
  Filled 2015-01-31: qty 2

## 2015-01-31 MED ORDER — DARBEPOETIN ALFA 200 MCG/0.4ML IJ SOSY
PREFILLED_SYRINGE | INTRAMUSCULAR | Status: AC
  Administered 2015-01-31: 200 ug
  Filled 2015-01-31: qty 0.4

## 2015-01-31 MED ORDER — MIDODRINE HCL 5 MG PO TABS
10.0000 mg | ORAL_TABLET | Freq: Two times a day (BID) | ORAL | Status: DC
  Administered 2015-01-31 – 2015-02-04 (×10): 10 mg via ORAL
  Filled 2015-01-31 (×8): qty 2

## 2015-01-31 NOTE — Progress Notes (Signed)
ANTIBIOTIC CONSULT NOTE - FOLLOW UP  Pharmacy Consult for Ceftazidime Indication: bacteremia  Allergies  Allergen Reactions  . Food Swelling    Red peppers    Patient Measurements: Height: 5\' 3"  (160 cm) Weight: 207 lb 8 oz (94.121 kg) IBW/kg (Calculated) : 52.4  Vital Signs: Temp: 98.6 F (37 C) (11/03 1100) Temp Source: Oral (11/03 1100) BP: 129/75 mmHg (11/03 1100) Pulse Rate: 98 (11/03 1100) Intake/Output from previous day:   Intake/Output from this shift:    Labs:  Recent Labs  01/29/15 0228 01/30/15 0308 01/31/15 0357  WBC 6.5 6.4 4.9  HGB 10.4* 9.8* 9.3*  PLT 152 77* 104*  CREATININE 6.55* 4.82* 6.06*   Estimated Creatinine Clearance: 13.6 mL/min (by C-G formula based on Cr of 6.06).  Assessment: 39yof with pseudomonal bacteremia, currently on meropenem, to be de-escalated to ceftazidime. She is ESRD on HD TTS.  Antimicrobials: Ceftriaxone 9/5>>9/6, 9/7>>9/8 Cipro 9/6>>9/7 Cephalexin 9/8>>9/12 Cefuroxime>9/26 x 1 for AVG surgery  Vancomycin 9/27>10/1, 10/3>>10/25 Cefazolin 10/1>>10/3 Ceftazidime 10/3>>10/6, 10/31>> Meropenem 10/29>>10/31 Levaquin 10/28>>10/29 Zosyn 10/28>>10/29  Microbiology Results: Urine 9/5>> > K pneumo susceptible to CTX/Cefazolin BC x 2 9/26> ngF BC x 2 9/27> ngF Urine 10/1>> pseudomonas (I to cipro) Urine 10/14>>ngF BC x2 10/14>>ngF BC x 1 10/16>>ngF BC x2 10/17>>ngF BC x 3 10/28>> 2/3 pseudomonas pan sensitive Urine 10/28>>ngF Cath tip 10/29>>ngtd  Plan:  Ceftazidime 2g IV x 1 today then 2g IV qHD TTS  Uvaldo Rising, BCPS  Clinical Pharmacist Pager [phone]  01/31/2015 11:25 AM

## 2015-01-31 NOTE — Evaluation (Signed)
Physical Therapy Evaluation Patient Details Name: [name] MRN: [id number] DOB: Aug 16, 1975 Today's Date: 01/31/2015   History of Present Illness  This 39 y.o female addmitted with fevers and AMS likely due to Sepsis from UTI. 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 Pt currently with functional limitations due to the deficits listed below (see PT Problem List). Pt really wants to go home and mom to care for her on d/c.  If she does go home, will need F/u and equipment as below.  Tried to convince pt she needs Rehab but pt not hearing it.  Will follow acutely.   Pt will benefit from skilled PT to increase their independence and safety with mobility to allow discharge to the venue listed below.      Follow Up Recommendations CIR;Supervision/Assistance - 24 hour (pt wants home so if home, HHPT, HHOt andHHaide)    Equipment Recommendations  Wheelchair (measurements PT);Wheelchair cushion (measurements PT) (bariatric 3N1, 22x18 wheelchair with pressure relieving cushion with desk arms, anti-tippers and foot rests)    Recommendations for Other Services Rehab consult     Precautions / Restrictions Precautions Precautions: Fall Precaution Comments: Pt's femoral cath is tunneled therefore she is able to get OOB. Restrictions Weight Bearing Restrictions: No      Mobility  Bed Mobility Overal bed mobility: Needs Assistance Bed Mobility: Rolling;Supine to Sit Rolling: Min assist   Supine to sit: HOB elevated;Max assist     General bed mobility comments: Took incr time for pt to get her balance once at EOB.  Was able to sit with min guard assist for 3 minutes once she got her balance.   Transfers Overall transfer level: Needs assistance Equipment used: Rolling walker (2 wheeled) Transfers: Sit to/from [facility] Sit to Stand: Mod  assist;+2 physical assistance Stand pivot transfers: +2 physical assistance;Min assist       General transfer comment: Assist to power up into standing, and assist for balance.  Pt  LEs with poor coordination.   Pt unsteady and did not want to ambulate further than to chair.   Ambulation/Gait                Stairs            Wheelchair Mobility    Modified Rankin (Stroke Patients Only)       Balance Overall balance assessment: Needs assistance Sitting-balance support: Bilateral upper extremity supported;Feet supported Sitting balance-Leahy Scale: Poor Sitting balance - Comments: varies between min guard assist and min A.  Posterior lean as she fatigues requiring min A to recover  Postural control: Posterior lean Standing balance support: Bilateral upper extremity supported;During functional activity Standing balance-Leahy Scale: Poor Standing balance comment: Pt relies on RW for balance in standing.                             Pertinent Vitals/Pain Pain Assessment: Faces Faces Pain Scale: Hurts little more Pain Location: left LE Pain Descriptors / Indicators: Grimacing;Sore Pain Intervention(s): Limited activity within patient's tolerance;Monitored during session;Repositioned  VSS    Home Living Family/patient expects to be discharged to:: Private residence Living Arrangements: Children;Parent (6 and 40 y.o children ) Available Help at Discharge: Family;Available 24 hours/day Type of Home: Apartment Home Access: Level entry;Ramped entrance     Home Layout: One level Home Equipment: Bedside commode;Walker - standard;Walker -  2 wheels      Prior Function Level of Independence: Needs assistance   Gait / Transfers Assistance Needed: mod I with RW  ADL's / Homemaking Assistance Needed: Requires assist with IADL  Comments: prior to admission 10/16, pt was fully independent      Hand Dominance   Dominant Hand: Right    Extremity/Trunk  Assessment   Upper Extremity Assessment: Defer to OT evaluation           Lower Extremity Assessment: RLE deficits/detail;LLE deficits/detail RLE Deficits / Details: grossly 3+/5 LLE Deficits / Details: grossly 3/5  Cervical / Trunk Assessment: Other exceptions  Communication   Communication: No difficulties  Cognition Arousal/Alertness: Awake/alert Behavior During Therapy: Flat affect Overall Cognitive Status: Within Functional Limits for tasks assessed Area of Impairment: Safety/judgement         Safety/Judgement: Decreased awareness of safety;Decreased awareness of deficits          General Comments General comments (skin integrity, edema, etc.): Pt and PT talked about pt going to Rehab.  Pt states REhab is not like home.  She states she just wants to go home and be in her environment with her mom assisting.  Pt states she will do better there as Rehab is not like her home. She wants a wheelchair and bariatric 3N1.      Exercises        Assessment/Plan    PT Assessment Patient needs continued PT services  PT Diagnosis Generalized weakness;Hemiplegia non-dominant side   PT Problem List Decreased strength;Decreased activity tolerance;Decreased range of motion;Decreased balance;Decreased mobility;Decreased knowledge of use of DME;Obesity  PT Treatment Interventions DME instruction;Gait training;Functional mobility training;Stair training;Therapeutic activities;Therapeutic exercise;Balance training;Neuromuscular re-education;Patient/family education   PT Goals (Current goals can be found in the Care Plan section) Acute Rehab PT Goals Patient Stated Goal: go home  PT Goal Formulation: With patient Time For Goal Achievement: 02/14/15 Potential to Achieve Goals: Good    Frequency Min 3X/week   Barriers to discharge        Co-evaluation               End of Session Equipment Utilized During Treatment: Gait belt Activity Tolerance: Patient limited by  fatigue Patient left: in chair;with call bell/phone within reach;with chair alarm set Nurse Communication: Mobility status         Time: 1131-1145 PT Time Calculation (min) (ACUTE ONLY): 14 min   Charges:   PT Evaluation $Initial PT Evaluation Tier I: 1 Procedure     PT G CodesDenice Paradise February 25, 2015, 2:43 PM Charvi Gammage [facility] Acute Rehabilitation [phone] (pager)

## 2015-01-31 NOTE — Progress Notes (Signed)
  [facility] Progress Note   Subjective: HR remains high 100-120, BP's on lower side, no compalitns this am  Filed Vitals:   01/30/15 2326 01/31/15 0300 01/31/15 0500 01/31/15 0806  BP: 113/62 116/71  118/71  Pulse: 104 100  99  Temp: 98.3 F (36.8 C) 98 F (36.7 C)  98 F (36.7 C)  TempSrc: Oral Oral  Oral  Resp: 17 18  15   Height:      Weight:   94.121 kg (207 lb 8 oz)   SpO2: 100% 98%  98%   Exam: Alert, cushingoid, frail/ weak , obese, no distress No jvd Chest clear on L, dec'd R base RRR no MRG Abd soft obese ntnd +bs L thigh 2+ edema, R thigh 1+ edema, good pedal pulses L thigh AVG +bruit Neuro gen weak, nonfocal  ECHO 01/15/15 normal LVEF, RV appears normal  TTS [place]   4h  400800   87.5kg  2K bath  L thigh AVG placed 10/7 (R thigh cath pulled) Hep 2600 No Fe or vit D Aranesp 200/wk      Assessment: 1. Pseudomonas cath sepsis - afebrile now, s/p cath removal (10/29) 2. ESRD recent start, using AVG now 3. Hx MV endocarditis, s/p Abx 4. Hypotension - a little better. On MTP for SVT 5. Vol excess up 6kg by wts w LE edema 6. 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 SLE on plaguenil 8. Anemia on max darbe , transfusing prn 9. Hx SVT on metoprolol for this 25 mg qhs  Plan - HD today, add midodrine, get vol down if possible, check cortisol/ TSH   Kelly Splinter MD [facility] pager [phone]    cell [phone] 01/31/2015, 9:24 AM    Recent Labs Lab 01/29/15 0228 01/30/15 0308 01/31/15 0357  NA 134* 130* 137  K 3.8 3.6 3.4*  CL 104 95* 103  CO2 23 28 27   GLUCOSE 66 67 67  BUN 24* 16 20  CREATININE 6.55* 4.82* 6.06*  CALCIUM 7.6* 7.4* 7.8*  PHOS 2.1* 1.9* 2.6    Recent Labs Lab 01/29/15 0228 01/30/15 0308 01/31/15 0357  ALBUMIN 1.3* 1.2* 1.3*    Recent Labs Lab 01/25/15 0405  01/29/15 0228 01/30/15 0308 01/31/15 0357  WBC 16.2*  < > 6.5 6.4 4.9  NEUTROABS 15.3*  --   --   --   --   HGB 8.9*  < > 10.4* 9.8* 9.3*  HCT 27.0*  < >  31.3* 29.4* 28.8*  MCV 91.8  < > 89.4 91.0 91.4  PLT 187  < > 152 77* 104*  < > = values in this interval not displayed. . sodium chloride   Intravenous Once  . atorvastatin  20 mg Oral q1800  . cefTAZidime (FORTAZ)  IV  2 g Intravenous Q T,Th,Sa-HD  . darbepoetin (ARANESP) injection - DIALYSIS  200 mcg Intravenous Q Thu-HD  . heparin  2,600 Units Dialysis Once in dialysis  . hydroxychloroquine  400 mg Oral Daily  . metoprolol succinate  25 mg Oral QHS  . multivitamin  1 tablet Oral QHS  . pantoprazole  40 mg Oral Daily  . sodium chloride  3 mL Intravenous Q12H     sodium chloride, sodium chloride, sodium chloride, sodium chloride, acetaminophen **OR** acetaminophen, alteplase, cyclobenzaprine, heparin, lidocaine (PF), lidocaine-prilocaine, loperamide, ondansetron **OR** ondansetron (ZOFRAN) IV, oxyCODONE-acetaminophen, pentafluoroprop-tetrafluoroeth, polyethylene glycol, trimethobenzamide

## 2015-01-31 NOTE — Progress Notes (Signed)
Subjective: Doing well today but reports increased swelling in the proximal left leg since yesterday evening. There is no associated discoloration, not much pain, and no distal leg or foot changes. She was unable to work with PT due to being at HD when they visited. Blood pressures remained good throughout the day and no episodes of dizziness or weakness.  Objective: Vital signs in last 24 hours: Filed Vitals:   01/31/15 0300 01/31/15 0500 01/31/15 0806 01/31/15 1100  BP: 116/71  118/71 129/75  Pulse: 100  99 98  Temp: 98 F (36.7 C)  98 F (36.7 C) 98.6 F (37 C)  TempSrc: Oral  Oral Oral  Resp: 18  15 18   Height:      Weight:  94.121 kg (207 lb 8 oz)    SpO2: 98%  98% 97%   Weight change: 1.86 kg (4 lb 1.6 oz)  Intake/Output Summary (Last 24 hours) at 01/31/15 1407 Last data filed at 01/31/15 1300  Gross per 24 hour  Intake    100 ml  Output      1 ml  Net     99 ml   GENERAL- alert, co-operative, NAD HEENT- Oral mucosa appears moist CARDIAC- regular rhythm, mildly tachycardic RESP- CTAB, no wheezes or crackles. ABDOMEN- Soft, nontender, no guarding or rebound NEURO- No obvious Cr N abnormality, strength upper extremities 5 out of 5, right lower extremity 5 out of 5, left lower extremity 4 out of 5 in hip flexion, knee extension, foot dorsiflexion. EXTREMITIES- LLE AVG with palpable thrill, proximal edema at left thigh PSYCH- Normal mood and affect, appropriate thought content and speech.  Lab Results: Basic Metabolic Panel:  Recent Labs Lab 01/30/15 0308 01/31/15 0357  NA 130* 137  K 3.6 3.4*  CL 95* 103  CO2 28 27  GLUCOSE 67 67  BUN 16 20  CREATININE 4.82* 6.06*  CALCIUM 7.4* 7.8*  PHOS 1.9* 2.6   Liver Function Tests:  Recent Labs Lab 01/30/15 0308 01/31/15 0357  ALBUMIN 1.2* 1.3*   No results for input(s): LIPASE, AMYLASE in the last 168 hours. No results for input(s): AMMONIA in the last 168 hours. CBC:  Recent Labs Lab 01/25/15 0405   01/30/15 0308 01/31/15 0357  WBC 16.2*  < > 6.4 4.9  NEUTROABS 15.3*  --   --   --   HGB 8.9*  < > 9.8* 9.3*  HCT 27.0*  < > 29.4* 28.8*  MCV 91.8  < > 91.0 91.4  PLT 187  < > 77* 104*  < > = values in this interval not displayed. Cardiac Enzymes:  Recent Labs Lab 01/25/15 1918 01/26/15 0109 01/26/15 0723  TROPONINI 0.21* 0.18* 0.29*   BNP: No results for input(s): PROBNP in the last 168 hours. D-Dimer: No results for input(s): DDIMER in the last 168 hours. CBG:  Recent Labs Lab 01/25/15 2032 01/25/15 2322 01/26/15 0255 01/27/15 0752 01/27/15 0827 01/27/15 0915  GLUCAP 101* 84 78 44* 66 73   Hemoglobin A1C: No results for input(s): HGBA1C in the last 168 hours. Fasting Lipid Panel: No results for input(s): CHOL, HDL, LDLCALC, TRIG, CHOLHDL, LDLDIRECT in the last 168 hours. Thyroid Function Tests: No results for input(s): TSH, T4TOTAL, FREET4, T3FREE, THYROIDAB in the last 168 hours. Coagulation:  Recent Labs Lab 01/26/15 0057 01/26/15 0723 01/27/15 0525 01/28/15 0110  LABPROT 21.6* 22.9* 24.4* 24.3*  INR 1.89* 2.04* 2.22* 2.21*   Anemia Panel:  Recent Labs Lab 01/26/15 0109  FERRITIN 1323*  TIBC NOT CALCULATED  IRON 21*   Urine Drug Screen: Drugs of Abuse     Component Value Date/Time   LABOPIA NONE DETECTED 07/21/2012 0215   COCAINSCRNUR NONE DETECTED 07/21/2012 0215   LABBENZ NONE DETECTED 07/21/2012 0215   AMPHETMU NONE DETECTED 07/21/2012 0215   THCU NONE DETECTED 07/21/2012 0215   LABBARB NONE DETECTED 07/21/2012 0215    Alcohol Level: No results for input(s): ETH in the last 168 hours. Urinalysis:  Recent Labs Lab 01/25/15 0513  COLORURINE YELLOW  LABSPEC 1.033*  PHURINE 6.0  GLUCOSEU NEGATIVE  HGBUR LARGE*  BILIRUBINUR SMALL*  KETONESUR 15*  PROTEINUR >300*  UROBILINOGEN 0.2  NITRITE POSITIVE*  LEUKOCYTESUR MODERATE*    Micro Results: Recent Results (from the past 240 hour(s))  Culture, blood (routine x 2)      Status: None   Collection Time: 01/25/15  4:05 AM  Result Value Ref Range Status   Specimen Description BLOOD RIGHT HAND  Final   Special Requests BOTTLES DRAWN AEROBIC AND ANAEROBIC 5CC  Final   Culture NO GROWTH 5 DAYS  Final   Report Status 01/30/2015 FINAL  Final  Culture, blood (routine x 2)     Status: None   Collection Time: 01/25/15  4:34 AM  Result Value Ref Range Status   Specimen Description BLOOD LEFT ARM  Final   Special Requests BOTTLES DRAWN AEROBIC ONLY 10CC  Final   Culture  Setup Time   Final    GRAM NEGATIVE RODS AEROBIC BOTTLE ONLY CRITICAL RESULT CALLED TO, READ BACK BY AND VERIFIED WITH: S HERBERT,RN AT 1351 01/26/15 BY L BENFIELD    Culture   Final    PSEUDOMONAS AERUGINOSA SUSCEPTIBILITIES PERFORMED ON PREVIOUS CULTURE WITHIN THE LAST 5 DAYS.    Report Status 01/28/2015 FINAL  Final  Urine culture     Status: None   Collection Time: 01/25/15  5:13 AM  Result Value Ref Range Status   Specimen Description URINE, CATHETERIZED  Final   Special Requests NONE  Final   Culture MULTIPLE SPECIES PRESENT, SUGGEST RECOLLECTION  Final   Report Status 01/26/2015 FINAL  Final  Culture, blood (routine x 2)     Status: None   Collection Time: 01/25/15  1:40 PM  Result Value Ref Range Status   Specimen Description BLOOD RIGHT FEMORAL ARTERY  Final   Special Requests BOTTLES DRAWN AEROBIC AND ANAEROBIC 10CC  Final   Culture  Setup Time   Final    GRAM NEGATIVE RODS AEROBIC BOTTLE ONLY CRITICAL RESULT CALLED TO, READ BACK BY AND VERIFIED WITH: Wyn Quaker RN 2106 01/25/15 A BROWNING    Culture PSEUDOMONAS AERUGINOSA  Final   Report Status 01/27/2015 FINAL  Final   Organism ID, Bacteria PSEUDOMONAS AERUGINOSA  Final      Susceptibility   Pseudomonas aeruginosa - MIC*    CEFTAZIDIME 4 SENSITIVE Sensitive     CIPROFLOXACIN 0.5 SENSITIVE Sensitive     GENTAMICIN <=1 SENSITIVE Sensitive     IMIPENEM 2 SENSITIVE Sensitive     PIP/TAZO 16 SENSITIVE Sensitive     CEFEPIME  8 SENSITIVE Sensitive     * PSEUDOMONAS AERUGINOSA  MRSA PCR Screening     Status: None   Collection Time: 01/25/15  8:35 PM  Result Value Ref Range Status   MRSA by PCR NEGATIVE NEGATIVE Final    Comment:        The GeneXpert MRSA Assay (FDA approved for NASAL specimens only), is one component of a comprehensive MRSA colonization  surveillance program. It is not intended to diagnose MRSA infection nor to guide or monitor treatment for MRSA infections.   Cath Tip Culture     Status: None   Collection Time: 01/26/15  5:23 PM  Result Value Ref Range Status   Specimen Description CATH TIP RIGHT THIGH HEMODIALYSIS CATHETER  Final   Special Requests NONE  Final   Culture LT15 Performed at [facility]   Final   Report Status 01/30/2015 FINAL  Final  C difficile quick scan w PCR reflex     Status: None   Collection Time: 01/27/15  9:46 AM  Result Value Ref Range Status   C Diff antigen NEGATIVE NEGATIVE Final   C Diff toxin NEGATIVE NEGATIVE Final   C Diff interpretation Negative for toxigenic C. difficile  Final  Culture, blood (routine x 2)     Status: None (Preliminary result)   Collection Time: 01/28/15  1:42 PM  Result Value Ref Range Status   Specimen Description BLOOD LEFT HAND  Final   Special Requests IN PEDIATRIC BOTTLE  3CC  Final   Culture NO GROWTH 2 DAYS  Final   Report Status PENDING  Incomplete   Studies/Results: No results found. Medications: I have reviewed the patient's current medications. Scheduled Meds: . sodium chloride   Intravenous Once  . atorvastatin  20 mg Oral q1800  . cefTAZidime (FORTAZ)  IV  2 g Intravenous Q T,Th,Sa-HD  . darbepoetin (ARANESP) injection - DIALYSIS  200 mcg Intravenous Q Thu-HD  . heparin  2,600 Units Dialysis Once in dialysis  . hydroxychloroquine  400 mg Oral Daily  . metoprolol succinate  25 mg Oral QHS  . midodrine  10 mg Oral BID WC  . multivitamin  1 tablet Oral QHS  . pantoprazole  40 mg Oral Daily  .  sodium chloride  3 mL Intravenous Q12H   Continuous Infusions:  PRN Meds:.sodium chloride, acetaminophen **OR** acetaminophen, cyclobenzaprine, loperamide, ondansetron **OR** ondansetron (ZOFRAN) IV, oxyCODONE-acetaminophen, polyethylene glycol, trimethobenzamide Assessment/Plan: Pansensitive Pseudomonas Catheter Line Blood Stream Infection - Doing well on therapy IV after HD. Will plan to continue this into outpatient dialysis. -F/U repeat Cx (NGTD) -Continue ceftazidime abx until 11/12 -Monitor fever curve and CBC daily   Hypotension - Not requiring pressors, still holding antihypertensives besides toprol for tachyardia. She became more hypotensive on HD 2 days ago and could not tolerate ultrafiltration, we will need to see how she does on HD today as she is several liters up by weigh compared to her goal outpatient dialysis weight. -Hold home imdur 30 mg  -Continue toprol-XL 25 mg -Fluid bolus if needed to keep MAP >65  Subacute right MCA & Acute right caudate head/lentiform nucleus 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 TEE was negative, without any of the MV vegetations observed that were present in September. -Hold coumadin for now in setting of acute infection and reassess risk/benefit after completion of antibiotic therapy in 2 weeks -PT and OT consults  Thrombocytopenia - Plts 104 from 77, down overall from 150s earlier in admission. 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 Fondaparinux not a good option due to ESRD on HD. 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 -TEE  scheduled today to assess status of previously identified vegetations   ESRD 2/2 SLE nephritis -  HD TuThSa through LLE AVG for access. -Appreciate nephrology recs -Continue rena-vit daily -Monitor renal function  Anemia of Chronic Disease - ESRD and SLE. Not actively bleeding anywhere. -Continue aranesp to maintain Hg >10 -Monitor CBC and transfuse if Hg <7  SLE: Continue home plaquenil 400 mg daily Hyperlipidemia: Continue home lipitor 20 mg daily  OSA: Patient does not have any desire to use CPAP, will discontinue ordering as has refused x3 nights GERD: Continue protonix 40 mg daily  Diet: Renal with 1200 mL restriction  DVT ppx: SCDs FULL CODE  Dispo: Disposition is deferred at this time, awaiting improvement of current medical problems.    The patient does have a current PCP Lin Landsman, MD) and does need an [facility] hospital follow-up appointment after discharge.  The patient does have transportation limitations that hinder transportation to clinic appointments.   LOS: 6 days   Collier Salina, MD 01/31/2015, 2:07 PM

## 2015-01-31 NOTE — Care Management Note (Signed)
Case Management Note  Patient Details  Name: Niemah Schwebke MRN: [id number] Date of Birth: May 30, 1975  Subjective/Objective:     Adm w fever,esrd-hd               Action/Plan: lives w fam, pcp dr Zadie Cleverly reece,act w [facility] for hhrn and hhpt   Expected Discharge Date:                  Expected Discharge Plan:  [place]  In-House Referral:     Discharge planning Services     Post Acute Care Choice:  Resumption of Svcs/PTA Provider Choice offered to:     DME Arranged:    DME Agency:     HH Arranged:  RN, PT [place] Agency:  [place]  Status of Service:     Medicare Important Message Given:    Date Medicare IM Given:    Medicare IM give by:    Date Additional Medicare IM Given:    Additional Medicare Important Message give by:     If discussed at [place] of Stay Meetings, dates discussed:    Additional Comments: [facility] aware pt in hosp  Lacretia Leigh, RN 01/31/2015, 10:51 AM

## 2015-01-31 NOTE — Progress Notes (Signed)
  Date: 01/31/2015  Patient name: [name]  Medical record number: [id number]  Date of birth: 1975-09-07   [name] plan of care was discussed with the house staff. Please see their note for complete details. I concur with their findings.  1. Pseudomonas bacteremia (line related) Will continue ceftaz with HD Plan for 14 days of rx starting from date of line removal.  Repeat BCx are ngtd  2. ESRD Will monitor how she tolerates HD today, previous issues with hypotension.  Hypokalemia- supplement with HD  3. Marantic endocarditis Repeat TEE (-) resolved  4. Thrombocytopenia Has improved with stopping heparin Will discuss with pharmacy re: adding as allergy  5. Deconditioning Will have PT eval  Campbell Riches, MD 01/31/2015, 11:13 AM

## 2015-01-31 NOTE — Progress Notes (Signed)
Rehab Admissions Coordinator Note:  Patient was screened by Retta Diones for appropriateness for an Inpatient Acute Rehab Consult.  At this time, we are recommending HH.  I spoke with attending Md and patient really wants to go home with [facility] therapies.  If she cannot progress to home with [facility], then could consider inpatient rehab consult.  Retta Diones 01/31/2015, 3:32 PM  I can be reached at [phone].

## 2015-02-01 DIAGNOSIS — R600 Localized edema: Secondary | ICD-10-CM

## 2015-02-01 LAB — RENAL FUNCTION PANEL
Albumin: 1.4 g/dL — ABNORMAL LOW (ref 3.5–5.0)
Anion gap: 10 (ref 5–15)
BUN: 13 mg/dL (ref 6–20)
CHLORIDE: 101 mmol/L (ref 101–111)
CO2: 27 mmol/L (ref 22–32)
CREATININE: 4.94 mg/dL — AB (ref 0.44–1.00)
Calcium: 8 mg/dL — ABNORMAL LOW (ref 8.9–10.3)
GFR calc Af Amer: 12 mL/min — ABNORMAL LOW (ref >60)
GFR calc non Af Amer: 10 mL/min — ABNORMAL LOW (ref >60)
Glucose, Bld: 59 mg/dL — ABNORMAL LOW (ref 65–99)
Phosphorus: 2.7 mg/dL (ref 2.5–4.6)
Potassium: 3.5 mmol/L (ref 3.5–5.1)
Sodium: 138 mmol/L (ref 135–145)

## 2015-02-01 LAB — CBC
HCT: 32.5 % — ABNORMAL LOW (ref 36.0–46.0)
Hemoglobin: 10.5 g/dL — ABNORMAL LOW (ref 12.0–15.0)
MCH: 29.5 pg (ref 26.0–34.0)
MCHC: 32.3 g/dL (ref 30.0–36.0)
MCV: 91.3 fL (ref 78.0–100.0)
PLATELETS: 88 10*3/uL — AB (ref 150–400)
RBC: 3.56 MIL/uL — ABNORMAL LOW (ref 3.87–5.11)
RDW: 18.7 % — AB (ref 11.5–15.5)
WBC: 7.2 10*3/uL (ref 4.0–10.5)

## 2015-02-01 NOTE — Progress Notes (Signed)
Physical Therapy Treatment Patient Details Name: [name] MRN: [id number] DOB: 1975/05/11 Today's Date: 02/01/2015    History of Present Illness This 39 y.o female addmitted with fevers and AMS likely due to Sepsis from UTI. 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 BP stable in sitting and standing  (116/75, 118/65 respectively.) Tolerated multiple therapeutic exercises well. She is currently open to the idea of continued inpatient rehab if accepted. LLE quite edematous but tolerated weight-bearing well. Patient will continue to benefit from skilled physical therapy services to further improve independence with functional mobility.   Follow Up Recommendations  CIR;Supervision/Assistance - 24 hour (pt wants home so if home, HHPT, HHOt andHHaide)     Equipment Recommendations  Wheelchair (measurements PT);Wheelchair cushion (measurements PT) (bariatric 3N1, 22x18 wheelchair with pressure relieving cush)    Recommendations for Other Services Rehab consult     Precautions / Restrictions Precautions Precautions: Fall Precaution Comments: Pt's femoral cath is tunneled therefore she is able to get OOB.    Mobility  Bed Mobility Overal bed mobility: Needs Assistance Bed Mobility: Rolling;Supine to Sit     Supine to sit: Min assist;HOB elevated     General bed mobility comments: Min assist to support LLE out of bed. VC for technique and assist for patient to pull through PTs hand to achieve EOB sitting. Able to scoot self to EOB. HOB was elevated  Transfers Overall transfer level: Needs assistance Equipment used: Rolling walker (2 wheeled) Transfers: Sit to/from  Stand Sit to Stand: Min assist Stand pivot transfers: Min assist       General transfer comment: Min assist for boost to stand. [place] for [employer]. VC for hand placement. Pt felt lightheaded upon standing and required to sit. Attempted second time long enough for BP (see vitals tab for orthostatics). HR elevated to 145 with each attempt. Third trial patient able to stand and pivot to chair with min assist for walker control and cues for safety. HR decreases upon sitting each time.  Ambulation/Gait             General Gait Details: Not performed due to elevated HR and feeling lightheaded   Stairs            Wheelchair Mobility    Modified Rankin (Stroke Patients Only)       Balance                                    Cognition Arousal/Alertness: Awake/alert Behavior During Therapy: Flat affect Overall Cognitive Status: Within Functional Limits for tasks assessed                      Exercises General Exercises - Lower Extremity Ankle Circles/Pumps: AROM;Both;20 reps;Seated Quad Sets: Strengthening;Both;10 reps;Seated Gluteal Sets: Strengthening;Both;10 reps;Seated Long Arc Quad: Strengthening;Both;10 reps;Seated Hip Flexion/Marching: Strengthening;Both;5 reps;Seated    General Comments General comments (skin integrity, edema, etc.): BP stable during therapy sessions. Feels lightheaded with standing and HR increased to 145. SpO2 100% on room air.      Pertinent Vitals/Pain Pain Assessment: Faces Faces Pain Scale: Hurts little more Pain Location: LLE Pain Descriptors / Indicators:  Tightness;Grimacing Pain Intervention(s): Monitored during session;Repositioned    Home Living                      Prior Function            PT Goals (current goals can now be found in the care plan section) Acute Rehab PT Goals Patient Stated Goal: go home  PT Goal Formulation: With patient Time For Goal Achievement: 02/14/15 Potential to  Achieve Goals: Good Progress towards PT goals: Progressing toward goals    Frequency  Min 3X/week    PT Plan Current plan remains appropriate    Co-evaluation             End of Session Equipment Utilized During Treatment: Gait belt Activity Tolerance: Treatment limited secondary to medical complications (Comment) (HR quickly elevates to 145 upon standing and is symptomatic) Patient left: in chair;with call bell/phone within reach     Time: 1742-1819 PT Time Calculation (min) (ACUTE ONLY): 37 min  Charges:  $Therapeutic Exercise: 8-22 mins $Therapeutic Activity: 8-22 mins                    G Codes:      Ellouise Newer 2015-02-05, 6:32 PM Camille Bal Viola, [place]

## 2015-02-01 NOTE — Progress Notes (Signed)
  Date: 02/01/2015  Patient name: [name]  Medical record number: [id number]  Date of birth: 1975-11-06   [name] plan of care was discussed with the house staff. Please see their note for complete details. I concur with their findings. 1. Pseudomonas central line infection Will continue ceftaz with HD End date 11/12.  Consider repeat BCx 1 week after completing therapy  2. ESRD Will speak with renal regarding her d/c plans.   3. Edema LLE Will speak with renal regarding this and need for vascular surgery f/u.  She has a good bruit at her AVF.   Campbell Riches, MD 02/01/2015, 12:25 PM

## 2015-02-01 NOTE — Progress Notes (Signed)
Occupational Therapy Treatment Patient Details Name: [name] MRN: [id number] DOB: 05-19-1975 Today's Date: 02/01/2015    History of present illness This 39 y.o female addmitted with fevers and AMS likely due to Sepsis from UTI. 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 Long discussion about rehab vs. [place] again.  Mother would like pt to go back to rehab and this therapist feels pt will be a safety risk at this level at home.  Feel pt needs further rehab before returning home.  She will need 24/7 S at home and family not sure someone can be there at all times.  Pt requires a lot of encouragement to participate in minimal activity.  Feel depression may be limiting her participation.  Nursing notified.  Follow Up Recommendations  CIR    Equipment Recommendations  Tub/shower bench;Wheelchair (measurements OT);Hospital bed    Recommendations for Other Services      Precautions / Restrictions Precautions Precautions: Fall       Mobility Bed Mobility Overal bed mobility: Needs Assistance Bed Mobility: Rolling;Supine to Sit Rolling: Min assist   Supine to sit: Max assist;+2 for physical assistance Sit to supine: Max assist;+2 for physical assistance   General bed mobility comments: Pt debilitated.  Requires assist for sitting balance and appears to be getting weaker.    Transfers                 General transfer comment: ATtempted to get pt to stand and refused despite encouragment to get OOB.    Balance Overall balance assessment: Needs assistance Sitting-balance support: Bilateral upper extremity supported;Feet supported Sitting balance-Leahy Scale: Poor Sitting balance - Comments: Pt leans from posterior to the L and needed mod assist to recover. Postural control: Posterior lean;Left lateral lean     Standing balance comment: pt  refused to stand despite encouragement.                   ADL Overall ADL's : Needs assistance/impaired Eating/Feeding: Set up;Sitting;Bed level   Grooming: Wash/dry hands;Wash/dry face;Oral care;Set up;Bed level   Upper Body Bathing: Minimal assitance;Sitting;Bed level                           Functional mobility during ADLs: Maximal assistance;+2 for physical assistance General ADL Comments: Pt very limited and depressed.  Unable to talk pt into doing much of anything during therapy but she still feels she can go home and not to rehab.  Feel she will be very difficult to care for at home and reviewed with her at length the reasons this therapist feels rehab is necessary.      Vision                     Perception     Praxis      Cognition   Behavior During Therapy: Flat affect Overall Cognitive Status: Within Functional Limits for tasks assessed Area of Impairment: Safety/judgement          Safety/Judgement: Decreased awareness of safety;Decreased awareness of deficits     General Comments: Pt feels she is okay to go home despite all the assist she is requiring.  Mother would like her to get rehab.  pt stated she would go but really does not want to.  Insight limited.    Extremity/Trunk Assessment  Exercises     Shoulder Instructions       General Comments      Pertinent Vitals/ Pain       Pain Assessment: Faces Pain Score: 5  Pain Location: Left leg Pain Descriptors / Indicators: Tightness;Sore Pain Intervention(s): Limited activity within patient's tolerance;Monitored during session;Repositioned  Home Living                                          Prior Functioning/Environment              Frequency Min 3X/week     Progress Toward Goals  OT Goals(current goals can now be found in the care plan section)  Progress towards OT goals: Progressing toward goals  Acute Rehab OT  Goals Patient Stated Goal: go home  OT Goal Formulation: With patient Time For Goal Achievement: 02/13/15 Potential to Achieve Goals: Good ADL Goals Pt Will Perform Grooming: with supervision;sitting Pt Will Perform Upper Body Bathing: with set-up;with supervision;sitting Pt Will Perform Lower Body Bathing: with mod assist;sit to/from stand Pt Will Perform Upper Body Dressing: with supervision;sitting Pt Will Perform Lower Body Dressing: with mod assist;sitting/lateral leans;sit to/from stand Pt Will Transfer to Toilet: with min assist;ambulating;regular height toilet;bedside commode;grab bars Pt Will Perform Toileting - Clothing Manipulation and hygiene: with mod assist;sit to/from stand Pt/caregiver will Perform Home Exercise Program: Increased strength;Right Upper extremity;Left upper extremity;With written HEP provided Additional ADL Goal #1: Pt will complete RUE FM strengthening exercises with independence following handout.   Plan Discharge plan remains appropriate    Co-evaluation                 End of Session     Activity Tolerance Patient limited by fatigue   Patient Left in bed;with call bell/phone within reach;with bed alarm set   Nurse Communication Mobility status        Time: 0962-8366 OT Time Calculation (min): 24 min  Charges: OT General Charges $OT Visit: 1 Procedure OT Treatments $Self Care/Home Management : 23-37 mins  Glenford Peers 02/01/2015, 1:37 PM  [phone]

## 2015-02-01 NOTE — Progress Notes (Signed)
  [facility] Progress Note   Subjective: BP's remains soft  Filed Vitals:   01/31/15 2145 02/01/15 0224 02/01/15 0633 02/01/15 0941  BP: 94/60 98/58 96/61  107/62  Pulse:   100 95  Temp:   98 F (36.7 C) 97.9 F (36.6 C)  TempSrc:   Oral Oral  Resp:   16 18  Height:      Weight:      SpO2:   100% 99%   Exam: Alert, cushingoid, frail/ weak , obese, no distress No jvd Chest clear on L, dec'd R base RRR no MRG Abd soft obese ntnd +bs L thigh 2+ edema, R thigh 1+ edema, good pedal pulses L thigh AVG +bruit Neuro gen weak, nonfocal  ECHO 01/15/15 normal LVEF, RV appears normal  TTS [place]   4h  87.5kg  2K bath  L thigh AVG placed 10/7 (R thigh cath pulled) Hep 2600 No Fe or vit D Aranesp 200/wk      Assessment: 1. Pseudomonas cath sepsis - afebrile now, s/p cath removal (10/29) 2. ESRD recent start, using AVG now 3. Hx MV endocarditis, s/p Abx 4. Hypotension - a little better. On MTP for SVT. Added midodrine here 5. Vol excess up 5kg, L thigh edema 6. 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 SLE on plaguenil 8. Anemia on max darbe , transfusing prn 9. Hx SVT on metoprolol for this 25 mg qhs  Plan - HD first shift in am. OK for dc after HD Sat, have d/w primary team.    Kelly Splinter MD [facility] pager [phone]    cell [phone] 02/01/2015, 1:25 PM    Recent Labs Lab 01/30/15 0308 01/31/15 0357 02/01/15 0353  NA 130* 137 138  K 3.6 3.4* 3.5  CL 95* 103 101  CO2 28 27 27   GLUCOSE 67 67 59*  BUN 16 20 13   CREATININE 4.82* 6.06* 4.94*  CALCIUM 7.4* 7.8* 8.0*  PHOS 1.9* 2.6 2.7    Recent Labs Lab 01/30/15 0308 01/31/15 0357 02/01/15 0353  ALBUMIN 1.2* 1.3* 1.4*    Recent Labs Lab 01/30/15 0308 01/31/15 0357 02/01/15 0353  WBC 6.4 4.9 7.2  HGB 9.8* 9.3* 10.5*  HCT 29.4* 28.8* 32.5*  MCV 91.0 91.4 91.3  PLT 77* 104* 88*   . sodium chloride   Intravenous Once  . atorvastatin  20 mg Oral q1800  . cefTAZidime (FORTAZ)  IV  2 g  Intravenous Q T,Th,Sa-HD  . darbepoetin (ARANESP) injection - DIALYSIS  200 mcg Intravenous Q Thu-HD  . heparin  2,600 Units Dialysis Once in dialysis  . hydroxychloroquine  400 mg Oral Daily  . metoprolol succinate  25 mg Oral QHS  . midodrine  10 mg Oral BID WC  . multivitamin  1 tablet Oral QHS  . pantoprazole  40 mg Oral Daily  . sodium chloride  3 mL Intravenous Q12H     sodium chloride, acetaminophen **OR** acetaminophen, cyclobenzaprine, loperamide, ondansetron **OR** ondansetron (ZOFRAN) IV, oxyCODONE-acetaminophen, polyethylene glycol, trimethobenzamide

## 2015-02-01 NOTE — Progress Notes (Signed)
Subjective: Became hypotensive and short of breath during hemodialysis yesterday limiting fluid removed to 1.5L. Outside of HD she did not have any dyspnea. Was able to participate with PT and move to bedside chair. She understands she is still very deconditioned and will require rehabilitation but expressed she wants to go home with home health PT once medically appropriate.  Objective: Vital signs in last 24 hours: Filed Vitals:   01/31/15 2145 02/01/15 0224 02/01/15 0633 02/01/15 0941  BP: 94/60 98/58 96/61  107/62  Pulse:   100 95  Temp:   98 F (36.7 C) 97.9 F (36.6 C)  TempSrc:   Oral Oral  Resp:   16 18  Height:      Weight:      SpO2:   100% 99%   Weight change: -0.621 kg (-1 lb 5.9 oz)  Intake/Output Summary (Last 24 hours) at 02/01/15 1145 Last data filed at 02/01/15 0941  Gross per 24 hour  Intake    240 ml  Output   1501 ml  Net  -1261 ml   GENERAL- alert, co-operative, obese HEENT- Oral mucosa appears moist CARDIAC- regular rhythm, mildly tachycardic RESP- CTAB, no wheezes or crackles. ABDOMEN- Soft, nontender, no guarding or rebound NEURO- No obvious Cr N abnormality, strength upper extremities 5 out of 5, right lower extremity 5 out of 5, left lower extremity 4 out of 5 in hip flexion, knee extension, foot dorsiflexion. EXTREMITIES- LLE AVG with palpable thrill, proximal edema at left thigh PSYCH- Normal mood and affect, appropriate thought content and speech.  Lab Results: Basic Metabolic Panel:  Recent Labs Lab 01/31/15 0357 02/01/15 0353  NA 137 138  K 3.4* 3.5  CL 103 101  CO2 27 27  GLUCOSE 67 59*  BUN 20 13  CREATININE 6.06* 4.94*  CALCIUM 7.8* 8.0*  PHOS 2.6 2.7   Liver Function Tests:  Recent Labs Lab 01/31/15 0357 02/01/15 0353  ALBUMIN 1.3* 1.4*   No results for input(s): LIPASE, AMYLASE in the last 168 hours. No results for input(s): AMMONIA in the last 168 hours. CBC:  Recent Labs Lab 01/31/15 0357 02/01/15 0353  WBC  4.9 7.2  HGB 9.3* 10.5*  HCT 28.8* 32.5*  MCV 91.4 91.3  PLT 104* 88*   Cardiac Enzymes:  Recent Labs Lab 01/25/15 1918 01/26/15 0109 01/26/15 0723  TROPONINI 0.21* 0.18* 0.29*   BNP: No results for input(s): PROBNP in the last 168 hours. D-Dimer: No results for input(s): DDIMER in the last 168 hours. CBG:  Recent Labs Lab 01/25/15 2032 01/25/15 2322 01/26/15 0255 01/27/15 0752 01/27/15 0827 01/27/15 0915  GLUCAP 101* 84 78 44* 66 73   Hemoglobin A1C: No results for input(s): HGBA1C in the last 168 hours. Fasting Lipid Panel: No results for input(s): CHOL, HDL, LDLCALC, TRIG, CHOLHDL, LDLDIRECT in the last 168 hours. Thyroid Function Tests:  Recent Labs Lab 01/31/15 1000  TSH 8.798*   Coagulation:  Recent Labs Lab 01/26/15 0057 01/26/15 0723 01/27/15 0525 01/28/15 0110  LABPROT 21.6* 22.9* 24.4* 24.3*  INR 1.89* 2.04* 2.22* 2.21*   Anemia Panel:  Recent Labs Lab 01/26/15 0109  FERRITIN 1323*  TIBC NOT CALCULATED  IRON 21*   Urine Drug Screen: Drugs of Abuse     Component Value Date/Time   LABOPIA NONE DETECTED 07/21/2012 0215   COCAINSCRNUR NONE DETECTED 07/21/2012 0215   LABBENZ NONE DETECTED 07/21/2012 0215   AMPHETMU NONE DETECTED 07/21/2012 0215   THCU NONE DETECTED 07/21/2012 0215   LABBARB NONE  DETECTED 07/21/2012 0215    Alcohol Level: No results for input(s): ETH in the last 168 hours. Urinalysis: No results for input(s): COLORURINE, LABSPEC, PHURINE, GLUCOSEU, HGBUR, BILIRUBINUR, KETONESUR, PROTEINUR, UROBILINOGEN, NITRITE, LEUKOCYTESUR in the last 168 hours.  Invalid input(s): APPERANCEUR  Micro Results: Recent Results (from the past 240 hour(s))  Culture, blood (routine x 2)     Status: None   Collection Time: 01/25/15  4:05 AM  Result Value Ref Range Status   Specimen Description BLOOD RIGHT HAND  Final   Special Requests BOTTLES DRAWN AEROBIC AND ANAEROBIC 5CC  Final   Culture NO GROWTH 5 DAYS  Final   Report Status  01/30/2015 FINAL  Final  Culture, blood (routine x 2)     Status: None   Collection Time: 01/25/15  4:34 AM  Result Value Ref Range Status   Specimen Description BLOOD LEFT ARM  Final   Special Requests BOTTLES DRAWN AEROBIC ONLY 10CC  Final   Culture  Setup Time   Final    GRAM NEGATIVE RODS AEROBIC BOTTLE ONLY CRITICAL RESULT CALLED TO, READ BACK BY AND VERIFIED WITH: S HERBERT,RN AT 1351 01/26/15 BY L BENFIELD    Culture   Final    PSEUDOMONAS AERUGINOSA SUSCEPTIBILITIES PERFORMED ON PREVIOUS CULTURE WITHIN THE LAST 5 DAYS.    Report Status 01/28/2015 FINAL  Final  Urine culture     Status: None   Collection Time: 01/25/15  5:13 AM  Result Value Ref Range Status   Specimen Description URINE, CATHETERIZED  Final   Special Requests NONE  Final   Culture MULTIPLE SPECIES PRESENT, SUGGEST RECOLLECTION  Final   Report Status 01/26/2015 FINAL  Final  Culture, blood (routine x 2)     Status: None   Collection Time: 01/25/15  1:40 PM  Result Value Ref Range Status   Specimen Description BLOOD RIGHT FEMORAL ARTERY  Final   Special Requests BOTTLES DRAWN AEROBIC AND ANAEROBIC 10CC  Final   Culture  Setup Time   Final    GRAM NEGATIVE RODS AEROBIC BOTTLE ONLY CRITICAL RESULT CALLED TO, READ BACK BY AND VERIFIED WITH: Wyn Quaker RN 2106 01/25/15 A BROWNING    Culture PSEUDOMONAS AERUGINOSA  Final   Report Status 01/27/2015 FINAL  Final   Organism ID, Bacteria PSEUDOMONAS AERUGINOSA  Final      Susceptibility   Pseudomonas aeruginosa - MIC*    CEFTAZIDIME 4 SENSITIVE Sensitive     CIPROFLOXACIN 0.5 SENSITIVE Sensitive     GENTAMICIN <=1 SENSITIVE Sensitive     IMIPENEM 2 SENSITIVE Sensitive     PIP/TAZO 16 SENSITIVE Sensitive     CEFEPIME 8 SENSITIVE Sensitive     * PSEUDOMONAS AERUGINOSA  MRSA PCR Screening     Status: None   Collection Time: 01/25/15  8:35 PM  Result Value Ref Range Status   MRSA by PCR NEGATIVE NEGATIVE Final    Comment:        The GeneXpert MRSA Assay  (FDA approved for NASAL specimens only), is one component of a comprehensive MRSA colonization surveillance program. It is not intended to diagnose MRSA infection nor to guide or monitor treatment for MRSA infections.   Cath Tip Culture     Status: None   Collection Time: 01/26/15  5:23 PM  Result Value Ref Range Status   Specimen Description CATH TIP RIGHT THIGH HEMODIALYSIS CATHETER  Final   Special Requests NONE  Final   Culture LT15 Performed at [facility]   Final  Report Status 01/30/2015 FINAL  Final  C difficile quick scan w PCR reflex     Status: None   Collection Time: 01/27/15  9:46 AM  Result Value Ref Range Status   C Diff antigen NEGATIVE NEGATIVE Final   C Diff toxin NEGATIVE NEGATIVE Final   C Diff interpretation Negative for toxigenic C. difficile  Final  Culture, blood (routine x 2)     Status: None (Preliminary result)   Collection Time: 01/28/15  1:42 PM  Result Value Ref Range Status   Specimen Description BLOOD LEFT HAND  Final   Special Requests IN PEDIATRIC BOTTLE  3CC  Final   Culture NO GROWTH 3 DAYS  Final   Report Status PENDING  Incomplete   Studies/Results: No results found. Medications: I have reviewed the patient's current medications. Scheduled Meds: . sodium chloride   Intravenous Once  . atorvastatin  20 mg Oral q1800  . cefTAZidime (FORTAZ)  IV  2 g Intravenous Q T,Th,Sa-HD  . darbepoetin (ARANESP) injection - DIALYSIS  200 mcg Intravenous Q Thu-HD  . heparin  2,600 Units Dialysis Once in dialysis  . hydroxychloroquine  400 mg Oral Daily  . metoprolol succinate  25 mg Oral QHS  . midodrine  10 mg Oral BID WC  . multivitamin  1 tablet Oral QHS  . pantoprazole  40 mg Oral Daily  . sodium chloride  3 mL Intravenous Q12H   Continuous Infusions:  PRN Meds:.sodium chloride, acetaminophen **OR** acetaminophen, cyclobenzaprine, loperamide, ondansetron **OR** ondansetron (ZOFRAN) IV, oxyCODONE-acetaminophen, polyethylene glycol,  trimethobenzamide Assessment/Plan: Pansensitive Pseudomonas Catheter Line Blood Stream Infection - Doing well on therapy IV after HD. Will plan to continue this into outpatient dialysis. -F/U repeat Cx (NGTD) -Continue ceftazidime abx until 11/12 -Monitor fever curve and CBC daily   Hypotension - HD ultrafiltration limited by hypotension and dyspnea. SBP in 100s today at rest. Discussed with nephrology service she may be on midodrine short term to help compensate for dialysis. Can continue holding any other antihypertensives, in fact target SBP should really be more for 140-160 in ESRD on HD patient to reduce complications of hypotension. 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 -Appreciate nephrology recs -Continue rena-vit daily -Monitor renal function  Anemia of Chronic Disease - ESRD and SLE. Not actively bleeding anywhere. -Continue aranesp to maintain Hg >10 -Monitor CBC and transfuse if Hg <7  SLE: Continue home plaquenil 400 mg daily Hyperlipidemia: Continue home lipitor 20 mg daily  OSA: Patient does not have any desire to use CPAP, will discontinue ordering as has refused x3 nights GERD: Continue protonix 40 mg daily  Diet: Renal with 1200 mL restriction  DVT ppx: SCDs FULL CODE  Dispo: Disposition is deferred at this  time, awaiting improvement of current medical problems. Anticipate likely discharge to home tomorrow if tolerating dialysis well.  The patient does have a current PCP Lin Landsman, MD) and does need an [facility] hospital follow-up appointment after discharge.  The patient does have transportation limitations that hinder transportation to clinic appointments.   LOS: 7 days   Collier Salina, MD 02/01/2015, 11:45 AM

## 2015-02-02 ENCOUNTER — Encounter (HOSPITAL_COMMUNITY): Payer: Self-pay | Admitting: Nephrology

## 2015-02-02 DIAGNOSIS — Z9889 Other specified postprocedural states: Secondary | ICD-10-CM

## 2015-02-02 LAB — RENAL FUNCTION PANEL
ALBUMIN: 1.5 g/dL — AB (ref 3.5–5.0)
ANION GAP: 9 (ref 5–15)
BUN: 19 mg/dL (ref 6–20)
CALCIUM: 8.3 mg/dL — AB (ref 8.9–10.3)
CO2: 27 mmol/L (ref 22–32)
CREATININE: 6.2 mg/dL — AB (ref 0.44–1.00)
Chloride: 99 mmol/L — ABNORMAL LOW (ref 101–111)
GFR calc Af Amer: 9 mL/min — ABNORMAL LOW (ref >60)
GFR calc non Af Amer: 8 mL/min — ABNORMAL LOW (ref >60)
GLUCOSE: 74 mg/dL (ref 65–99)
PHOSPHORUS: 3.9 mg/dL (ref 2.5–4.6)
Potassium: 3.3 mmol/L — ABNORMAL LOW (ref 3.5–5.1)
Sodium: 135 mmol/L (ref 135–145)

## 2015-02-02 LAB — CULTURE, BLOOD (ROUTINE X 2): Culture: NO GROWTH

## 2015-02-02 LAB — HEPATITIS B SURFACE ANTIGEN: Hepatitis B Surface Ag: NEGATIVE

## 2015-02-02 MED ORDER — LIDOCAINE HCL (PF) 1 % IJ SOLN: 5.0000 mL | INTRAMUSCULAR | Status: DC | PRN

## 2015-02-02 MED ORDER — SODIUM CHLORIDE 0.9 % IV SOLN: 100.0000 mL | INTRAVENOUS | Status: DC | PRN

## 2015-02-02 MED ORDER — PENTAFLUOROPROP-TETRAFLUOROETH EX AERO: 1.0000 "application " | INHALATION_SPRAY | CUTANEOUS | Status: DC | PRN

## 2015-02-02 MED ORDER — MIDODRINE HCL 5 MG PO TABS
ORAL_TABLET | ORAL | Status: AC
  Filled 2015-02-02: qty 2

## 2015-02-02 MED ORDER — LIDOCAINE-PRILOCAINE 2.5-2.5 % EX CREA: 1.0000 "application " | TOPICAL_CREAM | CUTANEOUS | Status: DC | PRN

## 2015-02-02 MED ORDER — HEPARIN SODIUM (PORCINE) 1000 UNIT/ML DIALYSIS: 2600.0000 [IU] | Freq: Once | INTRAMUSCULAR | Status: DC

## 2015-02-02 MED ORDER — ALTEPLASE 2 MG IJ SOLR: 2.0000 mg | Freq: Once | INTRAMUSCULAR | Status: DC | PRN

## 2015-02-02 MED ORDER — HEPARIN SODIUM (PORCINE) 1000 UNIT/ML DIALYSIS: 1000.0000 [IU] | INTRAMUSCULAR | Status: DC | PRN

## 2015-02-02 NOTE — Progress Notes (Signed)
Subjective: Seen on dialysis this morning. She was feeling tired and dyspneic and was hypotensive down to 70/50 requiring fluid bolus to replace ultrafiltrate. Because of this I will visit again later today at her hospital room to discuss disposition again.  Objective: Vital signs in last 24 hours: Filed Vitals:   02/02/15 0906 02/02/15 0930 02/02/15 0945 02/02/15 1015  BP: 101/69 89/69 93/66  100/73  Pulse: 101 107 108 105  Temp:    97.9 F (36.6 C)  TempSrc:    Oral  Resp:    20  Height:      Weight:    93.1 kg (205 lb 4 oz)  SpO2:    100%   Weight change: -1.873 kg (-4 lb 2.1 oz)  Intake/Output Summary (Last 24 hours) at 02/02/15 1117 Last data filed at 02/02/15 1015  Gross per 24 hour  Intake    120 ml  Output   1176 ml  Net  -1056 ml   GENERAL- alert, co-operative HEENT- Oral mucosa appears moist CARDIAC- regular rhythm, mildly tachycardic RESP- CTAB, no wheezes or crackles. EXTREMITIES- LLE AVG with palpable thrill, proximal edema at left thigh PSYCH- Blunted affect, appropriate thought content and speech.  Lab Results: Basic Metabolic Panel:  Recent Labs Lab 02/01/15 0353 02/02/15 0459  NA 138 135  K 3.5 3.3*  CL 101 99*  CO2 27 27  GLUCOSE 59* 74  BUN 13 19  CREATININE 4.94* 6.20*  CALCIUM 8.0* 8.3*  PHOS 2.7 3.9   Liver Function Tests:  Recent Labs Lab 02/01/15 0353 02/02/15 0459  ALBUMIN 1.4* 1.5*   No results for input(s): LIPASE, AMYLASE in the last 168 hours. No results for input(s): AMMONIA in the last 168 hours. CBC:  Recent Labs Lab 01/31/15 0357 02/01/15 0353  WBC 4.9 7.2  HGB 9.3* 10.5*  HCT 28.8* 32.5*  MCV 91.4 91.3  PLT 104* 88*   Cardiac Enzymes: No results for input(s): CKTOTAL, CKMB, CKMBINDEX, TROPONINI in the last 168 hours. BNP: No results for input(s): PROBNP in the last 168 hours. D-Dimer: No results for input(s): DDIMER in the last 168 hours. CBG:  Recent Labs Lab 01/27/15 0752 01/27/15 0827  01/27/15 0915  GLUCAP 44* 66 73   Hemoglobin A1C: No results for input(s): HGBA1C in the last 168 hours. Fasting Lipid Panel: No results for input(s): CHOL, HDL, LDLCALC, TRIG, CHOLHDL, LDLDIRECT in the last 168 hours. Thyroid Function Tests:  Recent Labs Lab 01/31/15 1000  TSH 8.798*   Coagulation:  Recent Labs Lab 01/27/15 0525 01/28/15 0110  LABPROT 24.4* 24.3*  INR 2.22* 2.21*   Anemia Panel: No results for input(s): VITAMINB12, FOLATE, FERRITIN, TIBC, IRON, RETICCTPCT in the last 168 hours. Urine Drug Screen: Drugs of Abuse     Component Value Date/Time   LABOPIA NONE DETECTED 07/21/2012 0215   COCAINSCRNUR NONE DETECTED 07/21/2012 0215   LABBENZ NONE DETECTED 07/21/2012 0215   AMPHETMU NONE DETECTED 07/21/2012 0215   THCU NONE DETECTED 07/21/2012 0215   LABBARB NONE DETECTED 07/21/2012 0215    Alcohol Level: No results for input(s): ETH in the last 168 hours. Urinalysis: No results for input(s): COLORURINE, LABSPEC, PHURINE, GLUCOSEU, HGBUR, BILIRUBINUR, KETONESUR, PROTEINUR, UROBILINOGEN, NITRITE, LEUKOCYTESUR in the last 168 hours.  Invalid input(s): APPERANCEUR  Micro Results: Recent Results (from the past 240 hour(s))  Culture, blood (routine x 2)     Status: None   Collection Time: 01/25/15  4:05 AM  Result Value Ref Range Status   Specimen Description BLOOD RIGHT  HAND  Final   Special Requests BOTTLES DRAWN AEROBIC AND ANAEROBIC 5CC  Final   Culture NO GROWTH 5 DAYS  Final   Report Status 01/30/2015 FINAL  Final  Culture, blood (routine x 2)     Status: None   Collection Time: 01/25/15  4:34 AM  Result Value Ref Range Status   Specimen Description BLOOD LEFT ARM  Final   Special Requests BOTTLES DRAWN AEROBIC ONLY 10CC  Final   Culture  Setup Time   Final    GRAM NEGATIVE RODS AEROBIC BOTTLE ONLY CRITICAL RESULT CALLED TO, READ BACK BY AND VERIFIED WITH: S HERBERT,RN AT 1351 01/26/15 BY L BENFIELD    Culture   Final    PSEUDOMONAS  AERUGINOSA SUSCEPTIBILITIES PERFORMED ON PREVIOUS CULTURE WITHIN THE LAST 5 DAYS.    Report Status 01/28/2015 FINAL  Final  Urine culture     Status: None   Collection Time: 01/25/15  5:13 AM  Result Value Ref Range Status   Specimen Description URINE, CATHETERIZED  Final   Special Requests NONE  Final   Culture MULTIPLE SPECIES PRESENT, SUGGEST RECOLLECTION  Final   Report Status 01/26/2015 FINAL  Final  Culture, blood (routine x 2)     Status: None   Collection Time: 01/25/15  1:40 PM  Result Value Ref Range Status   Specimen Description BLOOD RIGHT FEMORAL ARTERY  Final   Special Requests BOTTLES DRAWN AEROBIC AND ANAEROBIC 10CC  Final   Culture  Setup Time   Final    GRAM NEGATIVE RODS AEROBIC BOTTLE ONLY CRITICAL RESULT CALLED TO, READ BACK BY AND VERIFIED WITH: Wyn Quaker RN 2106 01/25/15 A BROWNING    Culture PSEUDOMONAS AERUGINOSA  Final   Report Status 01/27/2015 FINAL  Final   Organism ID, Bacteria PSEUDOMONAS AERUGINOSA  Final      Susceptibility   Pseudomonas aeruginosa - MIC*    CEFTAZIDIME 4 SENSITIVE Sensitive     CIPROFLOXACIN 0.5 SENSITIVE Sensitive     GENTAMICIN <=1 SENSITIVE Sensitive     IMIPENEM 2 SENSITIVE Sensitive     PIP/TAZO 16 SENSITIVE Sensitive     CEFEPIME 8 SENSITIVE Sensitive     * PSEUDOMONAS AERUGINOSA  MRSA PCR Screening     Status: None   Collection Time: 01/25/15  8:35 PM  Result Value Ref Range Status   MRSA by PCR NEGATIVE NEGATIVE Final    Comment:        The GeneXpert MRSA Assay (FDA approved for NASAL specimens only), is one component of a comprehensive MRSA colonization surveillance program. It is not intended to diagnose MRSA infection nor to guide or monitor treatment for MRSA infections.   Cath Tip Culture     Status: None   Collection Time: 01/26/15  5:23 PM  Result Value Ref Range Status   Specimen Description CATH TIP RIGHT THIGH HEMODIALYSIS CATHETER  Final   Special Requests NONE  Final   Culture LT15 Performed  at [facility]   Final   Report Status 01/30/2015 FINAL  Final  C difficile quick scan w PCR reflex     Status: None   Collection Time: 01/27/15  9:46 AM  Result Value Ref Range Status   C Diff antigen NEGATIVE NEGATIVE Final   C Diff toxin NEGATIVE NEGATIVE Final   C Diff interpretation Negative for toxigenic C. difficile  Final  Culture, blood (routine x 2)     Status: None   Collection Time: 01/28/15  1:42 PM  Result Value  Ref Range Status   Specimen Description BLOOD LEFT HAND  Final   Special Requests IN PEDIATRIC BOTTLE  3CC  Final   Culture NO GROWTH 5 DAYS  Final   Report Status 02/02/2015 FINAL  Final   Studies/Results: No results found. Medications: I have reviewed the patient's current medications. Scheduled Meds: . sodium chloride   Intravenous Once  . atorvastatin  20 mg Oral q1800  . cefTAZidime (FORTAZ)  IV  2 g Intravenous Q T,Th,Sa-HD  . darbepoetin (ARANESP) injection - DIALYSIS  200 mcg Intravenous Q Thu-HD  . hydroxychloroquine  400 mg Oral Daily  . metoprolol succinate  25 mg Oral QHS  . midodrine      . midodrine  10 mg Oral BID WC  . multivitamin  1 tablet Oral QHS  . pantoprazole  40 mg Oral Daily  . sodium chloride  3 mL Intravenous Q12H   Continuous Infusions:  PRN Meds:.sodium chloride, acetaminophen **OR** acetaminophen, cyclobenzaprine, loperamide, ondansetron **OR** ondansetron (ZOFRAN) IV, oxyCODONE-acetaminophen, polyethylene glycol, trimethobenzamide Assessment/Plan: Pansensitive Pseudomonas Catheter Line Blood Stream Infection - Repeat cultures and catheter culture negative -Continue ceftazidime abx post-HD until 11/12  Hypotension - HD ultrafiltration limited by hypotension and dyspnea. Bp down to 61/44 with symptoms of dyspnea and fatigue on HD. Was placed on short term midodrine by nephrology. 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 -Appreciate nephrology recs -Monitor renal function qdaily while IP  Anemia of Chronic Disease - Continue aranesp to maintain target Hg >10 SLE: Continue home plaquenil 400 mg daily Hyperlipidemia: Continue home lipitor 20 mg daily  OSA: Patient does not have any desire to use CPAP, will discontinue ordering as has refused x3 nights GERD: Continue protonix 40 mg daily  Diet: Renal with 1200 mL restriction  DVT ppx: SCDs FULL CODE  Dispo:  Anticipate likely discharge in approximately 0-1 days  The patient does have a current PCP Lin Landsman, MD) and does need an [facility] hospital follow-up appointment after discharge.  The patient does have transportation limitations that hinder transportation to clinic appointments.   LOS: 8 days   Collier Salina, MD 02/02/2015, 11:17 AM

## 2015-02-02 NOTE — Progress Notes (Signed)
[facility] Progress Note   Subjective: BP's remains soft  Filed Vitals:   02/02/15 0906 02/02/15 0930 02/02/15 0945 02/02/15 1015  BP: 101/69 89/69 93/66  100/73  Pulse: 101 107 108 105  Temp:    97.9 F (36.6 C)  TempSrc:    Oral  Resp:    20  Height:      Weight:    93.1 kg (205 lb 4 oz)  SpO2:    100%   Exam: Alert, cushingoid, frail/ weak , obese, no distress No jvd Chest clear on L, dec'd R base RRR no MRG Abd soft obese ntnd +bs L thigh 2+ edema, R thigh 1+ edema, good pedal pulses L thigh AVG +bruit Neuro gen weak, nonfocal  ECHO 01/15/15 normal LVEF, RV appears normal  Renal Hx: Aug 2014 first biopsy w DPGN, Rx w steroids and cellcept, f/b Dr Florene Glen [facility] 2015 stable creat mid 1's Early/ mid 2016 - 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 Creat 6 range.  Sept 2016 - presented w creat 8, started on dialysis  Access History:  Sept 2016 - temp R fem HD cath for HD initiation. Prior hx central venous access for SVT ablations. IR unable to place L IJ catheter. Per VVS venogram showing diseased UE veins up to the axillary vein, not a candidate for fistula per Dr Trula Slade.  Consider HeRO vs hybrid graft.  Sept 9, 2016 - IR placed tunneled cath R IJ approach Sept 20, 2016 - Dr Trula Slade did new LUA hybrid dialysis graft, cent venogram and left axillary vein stent. Had patent L subclavian vein but disease axillary vein. SVC was patent. Stent was deployed into the axillary vein prior to completion of hybrid graft insertion. 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 Pseudomonas cath sepsis - afebrile now, s/p cath removal (10/29) 2. ESRD recent start, using AVG now 3. Hx MV endocarditis, s/p Abx 4. Hypotension - a little better. On MTP for SVT. Added midodrine here 5. 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 SLE on plaguenil 8. Anemia on max darbe , transfusing prn 9. Hx SVT on metoprolol for this 25 mg qhs  Plan - HD this am.  OK for dc from renal standpoint.    Kelly Splinter MD [facility] pager [phone]    cell [phone] 02/02/2015, 11:29 AM    Recent Labs Lab 01/31/15 0357 02/01/15 0353 02/02/15 0459  NA 137 138 135  K 3.4* 3.5 3.3*  CL 103 101 99*  CO2 27 27 27   GLUCOSE 67 59* 74  BUN 20 13 19   CREATININE 6.06* 4.94* 6.20*  CALCIUM 7.8* 8.0* 8.3*  PHOS 2.6 2.7 3.9    Recent Labs Lab 01/31/15 0357 02/01/15 0353 02/02/15 0459  ALBUMIN 1.3* 1.4* 1.5*    Recent Labs Lab 01/30/15 0308 01/31/15 0357 02/01/15 0353  WBC 6.4 4.9 7.2  HGB 9.8* 9.3* 10.5*  HCT 29.4* 28.8*  32.5*  MCV 91.0 91.4 91.3  PLT 77* 104* 88*   . sodium chloride   Intravenous Once  . atorvastatin  20 mg Oral q1800  . cefTAZidime (FORTAZ)  IV  2 g Intravenous Q T,Th,Sa-HD  . darbepoetin (ARANESP) injection - DIALYSIS  200 mcg Intravenous Q Thu-HD  . hydroxychloroquine  400 mg Oral Daily  . metoprolol succinate  25 mg Oral QHS  . midodrine      . midodrine  10 mg Oral BID WC  . multivitamin  1 tablet Oral QHS  . pantoprazole  40 mg Oral Daily  . sodium chloride  3 mL Intravenous Q12H     sodium chloride, acetaminophen **OR** acetaminophen, cyclobenzaprine, loperamide, ondansetron **OR** ondansetron (ZOFRAN) IV, oxyCODONE-acetaminophen, polyethylene glycol, trimethobenzamide

## 2015-02-03 LAB — RENAL FUNCTION PANEL
ALBUMIN: 1.4 g/dL — AB (ref 3.5–5.0)
ANION GAP: 7 (ref 5–15)
BUN: 9 mg/dL (ref 6–20)
CALCIUM: 8.3 mg/dL — AB (ref 8.9–10.3)
CO2: 30 mmol/L (ref 22–32)
CREATININE: 4.19 mg/dL — AB (ref 0.44–1.00)
Chloride: 100 mmol/L — ABNORMAL LOW (ref 101–111)
GFR calc non Af Amer: 12 mL/min — ABNORMAL LOW (ref >60)
GFR, EST AFRICAN AMERICAN: 14 mL/min — AB (ref >60)
GLUCOSE: 79 mg/dL (ref 65–99)
PHOSPHORUS: 3.3 mg/dL (ref 2.5–4.6)
Potassium: 3.9 mmol/L (ref 3.5–5.1)
SODIUM: 137 mmol/L (ref 135–145)

## 2015-02-03 NOTE — Care Management (Addendum)
CM reviewed patient's record concerning discharge plan,   PT recommended CIR/ 24 hour supervision . Initially patient was wanting to go home with [facility] services. Patient has Medicaid which does not cover  [facility] PT/OT [facility] services, CM explained this to patient and family who states, she does not have 24 hour supervision. She was active with  [facility] before this admission. CM discussed this with Dr. Arcelia Jew, she discussed the goals of care with patient and family patient needing 24 hour supervision, patient has agreed on CIR. Dr. Arcelia Jew will reconsult CIR.

## 2015-02-03 NOTE — Progress Notes (Signed)
Subjective: No acute events overnight. Patient states she is feeling tired this morning but otherwise ok. She states she wants to go home and is agreeable only to that. She is not interested in doing inpatient rehab.  Objective: Vital signs in last 24 hours: Filed Vitals:   02/02/15 1153 02/02/15 1814 02/02/15 2041 02/03/15 0416  BP: 94/60 107/65 102/61 123/72  Pulse: 98 96 94 92  Temp: 98.4 F (36.9 C) 98 F (36.7 C) 98.3 F (36.8 C) 98.7 F (37.1 C)  TempSrc: Oral Oral    Resp: 16 18 18 18   Height:      Weight:   89.812 kg (198 lb)   SpO2: 100% 100% 99% 100%   Weight change: 1.473 kg (3 lb 4 oz)  Intake/Output Summary (Last 24 hours) at 02/03/15 0843 Last data filed at 02/03/15 0134  Gross per 24 hour  Intake      0 ml  Output   1176 ml  Net  -1176 ml   General: alert, sitting up in bed, NAD HEENT: /AT, EOMI, mucus membranes moist  CV: RRR, no m/g/r  Pulm: CTA bilaterally, no wheezes or crackles. Ext: LLE AVG with palpable thrill. She has left lower extremity edema from her thigh down to ankle.  Psych: Blunted affect, appropriate thought content and speech.  Lab Results: Basic Metabolic Panel:  Recent Labs Lab 02/02/15 0459 02/03/15 0302  NA 135 137  K 3.3* 3.9  CL 99* 100*  CO2 27 30  GLUCOSE 74 79  BUN 19 9  CREATININE 6.20* 4.19*  CALCIUM 8.3* 8.3*  PHOS 3.9 3.3   Liver Function Tests:  Recent Labs Lab 02/02/15 0459 02/03/15 0302  ALBUMIN 1.5* 1.4*   CBC:  Recent Labs Lab 01/31/15 0357 02/01/15 0353  WBC 4.9 7.2  HGB 9.3* 10.5*  HCT 28.8* 32.5*  MCV 91.4 91.3  PLT 104* 88*   CBG:  Recent Labs Lab 01/27/15 0915  GLUCAP 73   Thyroid Function Tests:  Recent Labs Lab 01/31/15 1000  TSH 8.798*   Coagulation:  Recent Labs Lab 01/28/15 0110  LABPROT 24.3*  INR 2.21*    Micro Results: Recent Results (from the past 240 hour(s))  Culture, blood (routine x 2)     Status: None   Collection Time: 01/25/15  4:05 AM    Result Value Ref Range Status   Specimen Description BLOOD RIGHT HAND  Final   Special Requests BOTTLES DRAWN AEROBIC AND ANAEROBIC 5CC  Final   Culture NO GROWTH 5 DAYS  Final   Report Status 01/30/2015 FINAL  Final  Culture, blood (routine x 2)     Status: None   Collection Time: 01/25/15  4:34 AM  Result Value Ref Range Status   Specimen Description BLOOD LEFT ARM  Final   Special Requests BOTTLES DRAWN AEROBIC ONLY 10CC  Final   Culture  Setup Time   Final    GRAM NEGATIVE RODS AEROBIC BOTTLE ONLY CRITICAL RESULT CALLED TO, READ BACK BY AND VERIFIED WITH: S HERBERT,RN AT 1351 01/26/15 BY L BENFIELD    Culture   Final    PSEUDOMONAS AERUGINOSA SUSCEPTIBILITIES PERFORMED ON PREVIOUS CULTURE WITHIN THE LAST 5 DAYS.    Report Status 01/28/2015 FINAL  Final  Urine culture     Status: None   Collection Time: 01/25/15  5:13 AM  Result Value Ref Range Status   Specimen Description URINE, CATHETERIZED  Final   Special Requests NONE  Final   Culture MULTIPLE SPECIES PRESENT,  SUGGEST RECOLLECTION  Final   Report Status 01/26/2015 FINAL  Final  Culture, blood (routine x 2)     Status: None   Collection Time: 01/25/15  1:40 PM  Result Value Ref Range Status   Specimen Description BLOOD RIGHT FEMORAL ARTERY  Final   Special Requests BOTTLES DRAWN AEROBIC AND ANAEROBIC 10CC  Final   Culture  Setup Time   Final    GRAM NEGATIVE RODS AEROBIC BOTTLE ONLY CRITICAL RESULT CALLED TO, READ BACK BY AND VERIFIED WITH: Wyn Quaker RN 2106 01/25/15 A BROWNING    Culture PSEUDOMONAS AERUGINOSA  Final   Report Status 01/27/2015 FINAL  Final   Organism ID, Bacteria PSEUDOMONAS AERUGINOSA  Final      Susceptibility   Pseudomonas aeruginosa - MIC*    CEFTAZIDIME 4 SENSITIVE Sensitive     CIPROFLOXACIN 0.5 SENSITIVE Sensitive     GENTAMICIN <=1 SENSITIVE Sensitive     IMIPENEM 2 SENSITIVE Sensitive     PIP/TAZO 16 SENSITIVE Sensitive     CEFEPIME 8 SENSITIVE Sensitive     * PSEUDOMONAS AERUGINOSA   MRSA PCR Screening     Status: None   Collection Time: 01/25/15  8:35 PM  Result Value Ref Range Status   MRSA by PCR NEGATIVE NEGATIVE Final    Comment:        The GeneXpert MRSA Assay (FDA approved for NASAL specimens only), is one component of a comprehensive MRSA colonization surveillance program. It is not intended to diagnose MRSA infection nor to guide or monitor treatment for MRSA infections.   Cath Tip Culture     Status: None   Collection Time: 01/26/15  5:23 PM  Result Value Ref Range Status   Specimen Description CATH TIP RIGHT THIGH HEMODIALYSIS CATHETER  Final   Special Requests NONE  Final   Culture LT15 Performed at [facility]   Final   Report Status 01/30/2015 FINAL  Final  C difficile quick scan w PCR reflex     Status: None   Collection Time: 01/27/15  9:46 AM  Result Value Ref Range Status   C Diff antigen NEGATIVE NEGATIVE Final   C Diff toxin NEGATIVE NEGATIVE Final   C Diff interpretation Negative for toxigenic C. difficile  Final  Culture, blood (routine x 2)     Status: None   Collection Time: 01/28/15  1:42 PM  Result Value Ref Range Status   Specimen Description BLOOD LEFT HAND  Final   Special Requests IN PEDIATRIC BOTTLE  3CC  Final   Culture NO GROWTH 5 DAYS  Final   Report Status 02/02/2015 FINAL  Final   Medications: I have reviewed the patient's current medications. Scheduled Meds: . sodium chloride   Intravenous Once  . atorvastatin  20 mg Oral q1800  . cefTAZidime (FORTAZ)  IV  2 g Intravenous Q T,Th,Sa-HD  . darbepoetin (ARANESP) injection - DIALYSIS  200 mcg Intravenous Q Thu-HD  . hydroxychloroquine  400 mg Oral Daily  . metoprolol succinate  25 mg Oral QHS  . midodrine  10 mg Oral BID WC  . multivitamin  1 tablet Oral QHS  . pantoprazole  40 mg Oral Daily  . sodium chloride  3 mL Intravenous Q12H   Continuous Infusions:  PRN Meds:.sodium chloride, acetaminophen **OR** acetaminophen, cyclobenzaprine, loperamide,  ondansetron **OR** ondansetron (ZOFRAN) IV, oxyCODONE-acetaminophen, polyethylene glycol, trimethobenzamide Assessment/Plan: Pansensitive Pseudomonas Catheter Line Blood Stream Infection - Repeat cultures and catheter culture negative - Continue ceftazidime abx post-HD until 11/12  Hypotension -  HD ultrafiltration limited by hypotension and dyspnea. BP keeps dropping to 60s-80s during HD. Has required Midodrine here.  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 I emphasized to her the importance of working with PT, OT and that I do not think she will be able to get the same level of care as she does at home. Will defer discharge until can discuss more with inpatient rehab, patient, and family.  -Hold coumadin for now in setting of acute infection and reassess risk/benefit after completion of antibiotic therapy in 2 weeks -PT and OT consults>> Recommending CIR but patient refusing. Recommending wheelchair.  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 - Appreciate nephrology recs - Monitor renal function qdaily while IP  Anemia of Chronic Disease - Continue aranesp to maintain target Hg >10 SLE: Continue home plaquenil 400 mg daily Hyperlipidemia: Continue home lipitor 20 mg daily  OSA: Patient does not have any desire to use CPAP, will discontinue ordering as has refused x3 nights GERD: Continue protonix 40 mg daily  Diet: Renal with 1200 mL restriction  DVT ppx: SCDs FULL CODE  Dispo: Will keep today as I think she would more appropriate for inpatient rehab. Will need to  discuss further with patient and family.   The patient does have a current PCP Lin Landsman, MD) and does need an [facility] hospital follow-up appointment after discharge.  The patient does have transportation limitations that hinder transportation to clinic appointments.   LOS: 9 days   [name], MD, MPH Internal Medicine Resident, PGY-II Pager: [phone]   02/03/2015, 8:43 AM

## 2015-02-03 NOTE — Progress Notes (Signed)
  [facility] Progress Note   Subjective: stable, wt's down but only 1.1 kg off w HD yest  Filed Vitals:   02/02/15 1814 02/02/15 2041 02/03/15 0416 02/03/15 1027  BP: 107/65 102/61 123/72 117/65  Pulse: 96 94 92 92  Temp: 98 F (36.7 C) 98.3 F (36.8 C) 98.7 F (37.1 C) 98.5 F (36.9 C)  TempSrc: Oral   Oral  Resp: 18 18 18 18   Height:      Weight:  89.812 kg (198 lb)    SpO2: 100% 99% 100% 98%   Exam: Alert, cushingoid, frail/ weak , obese, no distress No jvd Chest clear on L, dec'd R base RRR no MRG Abd soft obese ntnd +bs L thigh 2+ edema, R thigh 1+ edema, good pedal pulses L thigh AVG +bruit Neuro gen weak, nonfocal  ECHO 01/15/15 normal LVEF, RV appears normal  [facility]   4h  87.5kg  2K bath  L thigh AVG placed 10/7 (R thigh cath pulled) Hep 2600 No Fe or vit D Aranesp 200/wk      Assessment: 1. Pseudomonas cath sepsis - afebrile now, s/p cath removal (10/29) 2. ESRD recent start, using AVG L thigh 3. Hx MV endocarditis, s/p Abx 4. Hypotension - stable, on midodrine now. Also gets  MTP for SVT 5. 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 SLE on plaguenil 8. Anemia on max darbe , transfusing prn 9. Hx SVT on metoprolol for this 25 mg qhs  Plan - OK for dc from renal standpoint.    Kelly Splinter MD [facility] pager [phone]    cell [phone] 02/03/2015, 11:53 AM    Recent Labs Lab 02/01/15 0353 02/02/15 0459 02/03/15 0302  NA 138 135 137  K 3.5 3.3* 3.9  CL 101 99* 100*  CO2 27 27 30   GLUCOSE 59* 74 79  BUN 13 19 9   CREATININE 4.94* 6.20* 4.19*  CALCIUM 8.0* 8.3* 8.3*  PHOS 2.7 3.9 3.3    Recent Labs Lab 02/01/15 0353 02/02/15 0459 02/03/15 0302  ALBUMIN 1.4* 1.5* 1.4*    Recent Labs Lab 01/30/15 0308 01/31/15 0357 02/01/15 0353  WBC 6.4 4.9 7.2  HGB 9.8* 9.3* 10.5*  HCT 29.4* 28.8* 32.5*  MCV 91.0 91.4 91.3  PLT 77* 104* 88*   . sodium chloride   Intravenous Once  .  atorvastatin  20 mg Oral q1800  . cefTAZidime (FORTAZ)  IV  2 g Intravenous Q T,Th,Sa-HD  . darbepoetin (ARANESP) injection - DIALYSIS  200 mcg Intravenous Q Thu-HD  . hydroxychloroquine  400 mg Oral Daily  . metoprolol succinate  25 mg Oral QHS  . midodrine  10 mg Oral BID WC  . multivitamin  1 tablet Oral QHS  . pantoprazole  40 mg Oral Daily  . sodium chloride  3 mL Intravenous Q12H     sodium chloride, acetaminophen **OR** acetaminophen, cyclobenzaprine, loperamide, ondansetron **OR** ondansetron (ZOFRAN) IV, oxyCODONE-acetaminophen, polyethylene glycol, trimethobenzamide

## 2015-02-03 NOTE — Progress Notes (Signed)
ANTIBIOTIC CONSULT NOTE - FOLLOW UP  Pharmacy Consult for Ceftazidime Indication: bacteremia  Allergies  Allergen Reactions  . Food Swelling    Red peppers    Patient Measurements: Height: 5\' 3"  (160 cm) Weight: 198 lb (89.812 kg) IBW/kg (Calculated) : 52.4  Vital Signs: Temp: 98.5 F (36.9 C) (11/06 1027) Temp Source: Oral (11/06 1027) BP: 117/65 mmHg (11/06 1027) Pulse Rate: 92 (11/06 1027) Intake/Output from previous day: 11/05 0701 - 11/06 0700 In: 0  Out: 1176  Intake/Output from this shift: Total I/O In: 60 [P.O.:60] Out: 0   Labs:  Recent Labs  02/01/15 0353 02/02/15 0459 02/03/15 0302  WBC 7.2  --   --   HGB 10.5*  --   --   PLT 88*  --   --   CREATININE 4.94* 6.20* 4.19*   Estimated Creatinine Clearance: 19.2 mL/min (by C-G formula based on Cr of 4.19).  Assessment: 39yof with pseudomonal bacteremia, currently on ceftazidime. She is ESRD on HD TTS.  Planned stop date of antibiotics is 02/09/15.  If patient is discharged, this antibiotic can be continued at outpt HD center.  Antimicrobials: Ceftriaxone 9/5>>9/6, 9/7>>9/8 Cipro 9/6>>9/7 Cephalexin 9/8>>9/12 Cefuroxime>9/26 x 1 for AVG surgery  Vancomycin 9/27>10/1, 10/3>>10/25 Cefazolin 10/1>>10/3 Ceftazidime 10/3>>10/6, 10/31>> (02/09/15) Meropenem 10/29>>10/31 Levaquin 10/28>>10/29 Zosyn 10/28>>10/29  Microbiology Results: Urine 9/5>> > K pneumo susceptible to CTX/Cefazolin BC x 2 9/26> ngF BC x 2 9/27> ngF Urine 10/1>> pseudomonas (I to cipro) Urine 10/14>>ngF BC x2 10/14>>ngF BC x 1 10/16>>ngF BC x2 10/17>>ngF BC x 3 10/28>> 2/3 pseudomonas pan sensitive Urine 10/28>>ngF Cath tip 10/29>>ngtd  Plan:  Ceftazidime 2g IV qHD TTS  Gean Laursen, Pharm.D., BCPS Clinical Pharmacist Pager [phone] 02/03/2015 10:29 AM

## 2015-02-04 ENCOUNTER — Inpatient Hospital Stay (HOSPITAL_COMMUNITY)
Admission: RE | Admit: 2015-02-04 | Discharge: 2015-02-19 | DRG: 064 | Disposition: A | Discharge status: Other Acute Inpatient Hospital | Payer: Medicaid Other | Source: Intra-hospital | Attending: Physical Medicine & Rehabilitation | Admitting: Physical Medicine & Rehabilitation

## 2015-02-04 DIAGNOSIS — A4152 Sepsis due to Pseudomonas: Secondary | ICD-10-CM | POA: Diagnosis not present

## 2015-02-04 DIAGNOSIS — I471 Supraventricular tachycardia: Secondary | ICD-10-CM | POA: Diagnosis not present

## 2015-02-04 DIAGNOSIS — K580 Irritable bowel syndrome with diarrhea: Secondary | ICD-10-CM | POA: Diagnosis present

## 2015-02-04 DIAGNOSIS — I69354 Hemiplegia and hemiparesis following cerebral infarction affecting left non-dominant side: Secondary | ICD-10-CM | POA: Diagnosis not present

## 2015-02-04 DIAGNOSIS — M329 Systemic lupus erythematosus, unspecified: Secondary | ICD-10-CM | POA: Diagnosis not present

## 2015-02-04 DIAGNOSIS — M25562 Pain in left knee: Secondary | ICD-10-CM | POA: Diagnosis not present

## 2015-02-04 DIAGNOSIS — F22 Delusional disorders: Secondary | ICD-10-CM | POA: Diagnosis not present

## 2015-02-04 DIAGNOSIS — K219 Gastro-esophageal reflux disease without esophagitis: Secondary | ICD-10-CM | POA: Diagnosis present

## 2015-02-04 DIAGNOSIS — I63511 Cerebral infarction due to unspecified occlusion or stenosis of right middle cerebral artery: Principal | ICD-10-CM | POA: Diagnosis present

## 2015-02-04 DIAGNOSIS — D696 Thrombocytopenia, unspecified: Secondary | ICD-10-CM | POA: Diagnosis not present

## 2015-02-04 DIAGNOSIS — Z7901 Long term (current) use of anticoagulants: Secondary | ICD-10-CM

## 2015-02-04 DIAGNOSIS — G4730 Sleep apnea, unspecified: Secondary | ICD-10-CM | POA: Diagnosis present

## 2015-02-04 DIAGNOSIS — R1310 Dysphagia, unspecified: Secondary | ICD-10-CM | POA: Diagnosis present

## 2015-02-04 DIAGNOSIS — M3214 Glomerular disease in systemic lupus erythematosus: Secondary | ICD-10-CM | POA: Diagnosis present

## 2015-02-04 DIAGNOSIS — B9689 Other specified bacterial agents as the cause of diseases classified elsewhere: Secondary | ICD-10-CM | POA: Diagnosis present

## 2015-02-04 DIAGNOSIS — Y95 Nosocomial condition: Secondary | ICD-10-CM | POA: Diagnosis present

## 2015-02-04 DIAGNOSIS — R0902 Hypoxemia: Secondary | ICD-10-CM | POA: Diagnosis not present

## 2015-02-04 DIAGNOSIS — N390 Urinary tract infection, site not specified: Secondary | ICD-10-CM | POA: Diagnosis present

## 2015-02-04 DIAGNOSIS — D6859 Other primary thrombophilia: Secondary | ICD-10-CM | POA: Diagnosis present

## 2015-02-04 DIAGNOSIS — F320 Major depressive disorder, single episode, mild: Secondary | ICD-10-CM | POA: Diagnosis present

## 2015-02-04 DIAGNOSIS — R071 Chest pain on breathing: Secondary | ICD-10-CM

## 2015-02-04 DIAGNOSIS — F323 Major depressive disorder, single episode, severe with psychotic features: Secondary | ICD-10-CM | POA: Diagnosis present

## 2015-02-04 DIAGNOSIS — L89312 Pressure ulcer of right buttock, stage 2: Secondary | ICD-10-CM | POA: Diagnosis not present

## 2015-02-04 DIAGNOSIS — A419 Sepsis, unspecified organism: Secondary | ICD-10-CM | POA: Diagnosis not present

## 2015-02-04 DIAGNOSIS — F05 Delirium due to known physiological condition: Secondary | ICD-10-CM | POA: Diagnosis present

## 2015-02-04 DIAGNOSIS — F325 Major depressive disorder, single episode, in full remission: Secondary | ICD-10-CM | POA: Diagnosis present

## 2015-02-04 DIAGNOSIS — B957 Other staphylococcus as the cause of diseases classified elsewhere: Secondary | ICD-10-CM | POA: Diagnosis present

## 2015-02-04 DIAGNOSIS — G8194 Hemiplegia, unspecified affecting left nondominant side: Secondary | ICD-10-CM

## 2015-02-04 DIAGNOSIS — Z79899 Other long term (current) drug therapy: Secondary | ICD-10-CM | POA: Diagnosis not present

## 2015-02-04 DIAGNOSIS — I951 Orthostatic hypotension: Secondary | ICD-10-CM | POA: Diagnosis present

## 2015-02-04 DIAGNOSIS — M797 Fibromyalgia: Secondary | ICD-10-CM | POA: Diagnosis present

## 2015-02-04 DIAGNOSIS — E873 Alkalosis: Secondary | ICD-10-CM | POA: Diagnosis present

## 2015-02-04 DIAGNOSIS — R5381 Other malaise: Secondary | ICD-10-CM | POA: Diagnosis present

## 2015-02-04 DIAGNOSIS — D631 Anemia in chronic kidney disease: Secondary | ICD-10-CM | POA: Diagnosis present

## 2015-02-04 DIAGNOSIS — Z6832 Body mass index (BMI) 32.0-32.9, adult: Secondary | ICD-10-CM | POA: Diagnosis not present

## 2015-02-04 DIAGNOSIS — R410 Disorientation, unspecified: Secondary | ICD-10-CM

## 2015-02-04 DIAGNOSIS — I6930 Unspecified sequelae of cerebral infarction: Secondary | ICD-10-CM | POA: Diagnosis not present

## 2015-02-04 DIAGNOSIS — R509 Fever, unspecified: Secondary | ICD-10-CM

## 2015-02-04 DIAGNOSIS — G9340 Encephalopathy, unspecified: Secondary | ICD-10-CM | POA: Diagnosis present

## 2015-02-04 DIAGNOSIS — A4181 Sepsis due to Enterococcus: Secondary | ICD-10-CM | POA: Diagnosis present

## 2015-02-04 DIAGNOSIS — E785 Hyperlipidemia, unspecified: Secondary | ICD-10-CM | POA: Diagnosis present

## 2015-02-04 DIAGNOSIS — N186 End stage renal disease: Secondary | ICD-10-CM | POA: Diagnosis present

## 2015-02-04 DIAGNOSIS — Z992 Dependence on renal dialysis: Secondary | ICD-10-CM

## 2015-02-04 DIAGNOSIS — Z8673 Personal history of transient ischemic attack (TIA), and cerebral infarction without residual deficits: Secondary | ICD-10-CM | POA: Diagnosis not present

## 2015-02-04 DIAGNOSIS — E46 Unspecified protein-calorie malnutrition: Secondary | ICD-10-CM | POA: Diagnosis present

## 2015-02-04 DIAGNOSIS — R197 Diarrhea, unspecified: Secondary | ICD-10-CM | POA: Diagnosis not present

## 2015-02-04 DIAGNOSIS — J189 Pneumonia, unspecified organism: Secondary | ICD-10-CM | POA: Diagnosis present

## 2015-02-04 DIAGNOSIS — R1030 Lower abdominal pain, unspecified: Secondary | ICD-10-CM | POA: Diagnosis not present

## 2015-02-04 DIAGNOSIS — D61818 Other pancytopenia: Secondary | ICD-10-CM | POA: Diagnosis present

## 2015-02-04 DIAGNOSIS — B952 Enterococcus as the cause of diseases classified elsewhere: Secondary | ICD-10-CM | POA: Diagnosis present

## 2015-02-04 DIAGNOSIS — D619 Aplastic anemia, unspecified: Secondary | ICD-10-CM | POA: Diagnosis present

## 2015-02-04 DIAGNOSIS — M3213 Lung involvement in systemic lupus erythematosus: Secondary | ICD-10-CM | POA: Diagnosis not present

## 2015-02-04 DIAGNOSIS — M79605 Pain in left leg: Secondary | ICD-10-CM | POA: Diagnosis present

## 2015-02-04 DIAGNOSIS — R600 Localized edema: Secondary | ICD-10-CM | POA: Diagnosis not present

## 2015-02-04 DIAGNOSIS — D62 Acute posthemorrhagic anemia: Secondary | ICD-10-CM | POA: Diagnosis present

## 2015-02-04 DIAGNOSIS — R6520 Severe sepsis without septic shock: Secondary | ICD-10-CM | POA: Diagnosis present

## 2015-02-04 DIAGNOSIS — D6189 Other specified aplastic anemias and other bone marrow failure syndromes: Secondary | ICD-10-CM | POA: Diagnosis not present

## 2015-02-04 DIAGNOSIS — F419 Anxiety disorder, unspecified: Secondary | ICD-10-CM | POA: Diagnosis present

## 2015-02-04 DIAGNOSIS — R079 Chest pain, unspecified: Secondary | ICD-10-CM

## 2015-02-04 DIAGNOSIS — T82838A Hemorrhage of vascular prosthetic devices, implants and grafts, initial encounter: Secondary | ICD-10-CM | POA: Diagnosis not present

## 2015-02-04 DIAGNOSIS — F29 Unspecified psychosis not due to a substance or known physiological condition: Secondary | ICD-10-CM | POA: Diagnosis not present

## 2015-02-04 DIAGNOSIS — Z91018 Allergy to other foods: Secondary | ICD-10-CM | POA: Diagnosis not present

## 2015-02-04 DIAGNOSIS — R1311 Dysphagia, oral phase: Secondary | ICD-10-CM | POA: Diagnosis present

## 2015-02-04 DIAGNOSIS — I058 Other rheumatic mitral valve diseases: Secondary | ICD-10-CM | POA: Diagnosis not present

## 2015-02-04 DIAGNOSIS — I120 Hypertensive chronic kidney disease with stage 5 chronic kidney disease or end stage renal disease: Secondary | ICD-10-CM | POA: Diagnosis not present

## 2015-02-04 DIAGNOSIS — R091 Pleurisy: Secondary | ICD-10-CM | POA: Diagnosis not present

## 2015-02-04 DIAGNOSIS — J90 Pleural effusion, not elsewhere classified: Secondary | ICD-10-CM | POA: Diagnosis present

## 2015-02-04 DIAGNOSIS — E876 Hypokalemia: Secondary | ICD-10-CM | POA: Diagnosis present

## 2015-02-04 DIAGNOSIS — E43 Unspecified severe protein-calorie malnutrition: Secondary | ICD-10-CM | POA: Diagnosis present

## 2015-02-04 DIAGNOSIS — Z1621 Resistance to vancomycin: Secondary | ICD-10-CM | POA: Diagnosis present

## 2015-02-04 DIAGNOSIS — M160 Bilateral primary osteoarthritis of hip: Secondary | ICD-10-CM | POA: Diagnosis present

## 2015-02-04 DIAGNOSIS — I953 Hypotension of hemodialysis: Secondary | ICD-10-CM | POA: Diagnosis present

## 2015-02-04 DIAGNOSIS — R7881 Bacteremia: Secondary | ICD-10-CM | POA: Diagnosis not present

## 2015-02-04 LAB — RENAL FUNCTION PANEL
ALBUMIN: 1.5 g/dL — AB (ref 3.5–5.0)
Albumin: 1.8 g/dL — ABNORMAL LOW (ref 3.5–5.0)
Anion gap: 6 (ref 5–15)
Anion gap: 10 (ref 5–15)
BUN: 14 mg/dL (ref 6–20)
BUN: 17 mg/dL (ref 6–20)
CALCIUM: 8.3 mg/dL — AB (ref 8.9–10.3)
CO2: 28 mmol/L (ref 22–32)
CO2: 27 mmol/L (ref 22–32)
CREATININE: 5.74 mg/dL — AB (ref 0.44–1.00)
Calcium: 8.6 mg/dL — ABNORMAL LOW (ref 8.9–10.3)
Chloride: 102 mmol/L (ref 101–111)
Chloride: 99 mmol/L — ABNORMAL LOW (ref 101–111)
Creatinine, Ser: 6.39 mg/dL — ABNORMAL HIGH (ref 0.44–1.00)
GFR calc Af Amer: 10 mL/min — ABNORMAL LOW (ref >60)
GFR calc Af Amer: 9 mL/min — ABNORMAL LOW (ref >60)
GFR calc non Af Amer: 7 mL/min — ABNORMAL LOW (ref >60)
GFR, EST NON AFRICAN AMERICAN: 8 mL/min — AB (ref >60)
GLUCOSE: 75 mg/dL (ref 65–99)
Glucose, Bld: 79 mg/dL (ref 65–99)
PHOSPHORUS: 4.4 mg/dL (ref 2.5–4.6)
POTASSIUM: 3.5 mmol/L (ref 3.5–5.1)
Phosphorus: 5 mg/dL — ABNORMAL HIGH (ref 2.5–4.6)
Potassium: 3.7 mmol/L (ref 3.5–5.1)
SODIUM: 136 mmol/L (ref 135–145)
Sodium: 136 mmol/L (ref 135–145)

## 2015-02-04 LAB — CBC
HCT: 38.1 % (ref 36.0–46.0)
Hemoglobin: 12.1 g/dL (ref 12.0–15.0)
MCH: 30 pg (ref 26.0–34.0)
MCHC: 31.8 g/dL (ref 30.0–36.0)
MCV: 94.3 fL (ref 78.0–100.0)
Platelets: 127 10*3/uL — ABNORMAL LOW (ref 150–400)
RBC: 4.04 MIL/uL (ref 3.87–5.11)
RDW: 19.1 % — ABNORMAL HIGH (ref 11.5–15.5)
WBC: 11.7 10*3/uL — ABNORMAL HIGH (ref 4.0–10.5)

## 2015-02-04 MED ORDER — PRO-STAT SUGAR FREE PO LIQD
30.0000 mL | Freq: Two times a day (BID) | ORAL | Status: DC
  Administered 2015-02-04: 30 mL via ORAL
  Filled 2015-02-04 (×2): qty 30

## 2015-02-04 MED ORDER — SODIUM CHLORIDE 0.9 % IV SOLN: 100.0000 mL | INTRAVENOUS | Status: DC | PRN

## 2015-02-04 MED ORDER — DARBEPOETIN ALFA 200 MCG/0.4ML IJ SOSY
200.0000 ug | PREFILLED_SYRINGE | INTRAMUSCULAR | Status: DC
  Administered 2015-02-07 – 2015-02-14 (×2): 200 ug via INTRAVENOUS
  Filled 2015-02-04 (×2): qty 0.4

## 2015-02-04 MED ORDER — MIDODRINE HCL 10 MG PO TABS: 10.0000 mg | ORAL_TABLET | Freq: Two times a day (BID) | ORAL | Status: DC

## 2015-02-04 MED ORDER — OXYCODONE-ACETAMINOPHEN 5-325 MG PO TABS
1.0000 | ORAL_TABLET | Freq: Four times a day (QID) | ORAL | Status: DC | PRN
  Administered 2015-02-06 – 2015-02-17 (×8): 1 via ORAL
  Filled 2015-02-04 (×10): qty 1

## 2015-02-04 MED ORDER — ALTEPLASE 2 MG IJ SOLR
2.0000 mg | Freq: Once | INTRAMUSCULAR | Status: DC | PRN
  Filled 2015-02-04: qty 2

## 2015-02-04 MED ORDER — METOPROLOL SUCCINATE ER 25 MG PO TB24
25.0000 mg | ORAL_TABLET | Freq: Every day | ORAL | Status: DC
  Administered 2015-02-04 – 2015-02-18 (×13): 25 mg via ORAL
  Filled 2015-02-04 (×14): qty 1

## 2015-02-04 MED ORDER — ONDANSETRON HCL 4 MG/2ML IJ SOLN
4.0000 mg | Freq: Four times a day (QID) | INTRAMUSCULAR | Status: DC | PRN
  Administered 2015-02-06 – 2015-02-07 (×2): 4 mg via INTRAVENOUS
  Filled 2015-02-04 (×2): qty 2

## 2015-02-04 MED ORDER — CYCLOBENZAPRINE HCL 10 MG PO TABS
10.0000 mg | ORAL_TABLET | Freq: Every evening | ORAL | Status: DC | PRN
  Filled 2015-02-04: qty 1

## 2015-02-04 MED ORDER — POLYETHYLENE GLYCOL 3350 17 G PO PACK: 17.0000 g | PACK | Freq: Every day | ORAL | Status: AC | PRN

## 2015-02-04 MED ORDER — TRIMETHOBENZAMIDE HCL 300 MG PO CAPS: 300.0000 mg | ORAL_CAPSULE | Freq: Three times a day (TID) | ORAL | Status: AC | PRN

## 2015-02-04 MED ORDER — LIDOCAINE-PRILOCAINE 2.5-2.5 % EX CREA
1.0000 "application " | TOPICAL_CREAM | CUTANEOUS | Status: DC | PRN
  Filled 2015-02-04: qty 5

## 2015-02-04 MED ORDER — ATORVASTATIN CALCIUM 20 MG PO TABS
20.0000 mg | ORAL_TABLET | Freq: Every day | ORAL | Status: DC
  Administered 2015-02-06 – 2015-02-17 (×12): 20 mg via ORAL
  Filled 2015-02-04 (×14): qty 1

## 2015-02-04 MED ORDER — PRO-STAT SUGAR FREE PO LIQD
30.0000 mL | Freq: Two times a day (BID) | ORAL | Status: DC
  Filled 2015-02-04 (×15): qty 30

## 2015-02-04 MED ORDER — ACETAMINOPHEN 650 MG RE SUPP: 650.0000 mg | Freq: Four times a day (QID) | RECTAL | Status: DC | PRN

## 2015-02-04 MED ORDER — HYDROXYCHLOROQUINE SULFATE 200 MG PO TABS
400.0000 mg | ORAL_TABLET | Freq: Every day | ORAL | Status: DC
  Administered 2015-02-05 – 2015-02-11 (×7): 400 mg via ORAL
  Filled 2015-02-04 (×16): qty 2

## 2015-02-04 MED ORDER — DEXTROSE 5 % IV SOLN
2.0000 g | INTRAVENOUS | Status: DC
  Administered 2015-02-05 – 2015-02-09 (×3): 2 g via INTRAVENOUS
  Filled 2015-02-04 (×7): qty 2

## 2015-02-04 MED ORDER — LIDOCAINE HCL (PF) 1 % IJ SOLN: 5.0000 mL | INTRAMUSCULAR | Status: DC | PRN

## 2015-02-04 MED ORDER — HEPARIN SODIUM (PORCINE) 1000 UNIT/ML DIALYSIS
1000.0000 [IU] | INTRAMUSCULAR | Status: DC | PRN
  Filled 2015-02-04: qty 1

## 2015-02-04 MED ORDER — DARBEPOETIN ALFA 200 MCG/0.4ML IJ SOSY: 200.0000 ug | PREFILLED_SYRINGE | INTRAMUSCULAR | Status: DC

## 2015-02-04 MED ORDER — DEXTROSE 5 % IV SOLN: 2.0000 g | INTRAVENOUS | Status: DC

## 2015-02-04 MED ORDER — ONDANSETRON HCL 4 MG PO TABS
4.0000 mg | ORAL_TABLET | Freq: Four times a day (QID) | ORAL | Status: DC | PRN
  Administered 2015-02-09 – 2015-02-17 (×7): 4 mg via ORAL
  Filled 2015-02-04 (×7): qty 1

## 2015-02-04 MED ORDER — MIDODRINE HCL 5 MG PO TABS
10.0000 mg | ORAL_TABLET | Freq: Two times a day (BID) | ORAL | Status: DC
  Administered 2015-02-05 – 2015-02-12 (×13): 10 mg via ORAL
  Filled 2015-02-04 (×17): qty 2

## 2015-02-04 MED ORDER — SORBITOL 70 % SOLN: 30.0000 mL | Freq: Every day | Status: DC | PRN

## 2015-02-04 MED ORDER — PENTAFLUOROPROP-TETRAFLUOROETH EX AERO: 1.0000 "application " | INHALATION_SPRAY | CUTANEOUS | Status: DC | PRN

## 2015-02-04 MED ORDER — ACETAMINOPHEN 325 MG PO TABS: 650.0000 mg | ORAL_TABLET | Freq: Four times a day (QID) | ORAL | Status: DC | PRN

## 2015-02-04 MED ORDER — POLYETHYLENE GLYCOL 3350 17 G PO PACK: 17.0000 g | PACK | Freq: Every day | ORAL | Status: DC | PRN

## 2015-02-04 MED ORDER — LOPERAMIDE HCL 2 MG PO CAPS
2.0000 mg | ORAL_CAPSULE | ORAL | Status: DC | PRN
  Administered 2015-02-11: 2 mg via ORAL
  Filled 2015-02-04 (×3): qty 1

## 2015-02-04 MED ORDER — RENA-VITE PO TABS
1.0000 | ORAL_TABLET | Freq: Every day | ORAL | Status: DC
  Administered 2015-02-04 – 2015-02-18 (×14): 1 via ORAL
  Filled 2015-02-04 (×15): qty 1

## 2015-02-04 MED ORDER — PANTOPRAZOLE SODIUM 40 MG PO TBEC
40.0000 mg | DELAYED_RELEASE_TABLET | Freq: Every day | ORAL | Status: DC
  Administered 2015-02-05 – 2015-02-19 (×13): 40 mg via ORAL
  Filled 2015-02-04 (×15): qty 1

## 2015-02-04 NOTE — Progress Notes (Signed)
[facility] Progress Note  Assessment/Plan: 1. Pseudomonas sepsis- catheter out 10/29 - using graft - repeat cultures 10/31 no growth - on Fortaz through 11/12 2. ESRD - TTS - next HD Tuesday, using graft now - requiring added K bath K 3.5; no appetite - dislikes liquids supplements - agreeable to prostat; using left thigh graft but leg and left side remain quite edematous 3. Anemia - Hgb 10.5 stable - max ESA 4. Secondary hyperparathyroidism - no vitamin D - no binders yet 5. HTN/volume - midodrine for BP support - titrating volume, needs significant lowering 6. Nutrition - protein malnutrition likely limiting volume removal- needs high protein diet- liberalize diet to improve intake 7. Hx MV endocarditis s/p abtx 8. SLE - on plaqeunil 9. Hx SVT on MTP  10. Deconditioning - needs inpt rehab - reluctant because she thought she had to go for 4 weeks - but would be agreeable to 2-3 weeks.  I have discussed with Rehab RN who screens pts - she will contact primary for referral. 11. Thrombocytopenia - platelets low last 3 labs- hold heparin - follow 12. 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 Weak.    Objective Filed Vitals:   02/03/15 1027 02/03/15 1746 02/03/15 2100 02/04/15 0500  BP: 117/65 126/65 131/74 116/69  Pulse: 92 94 94 90  Temp: 98.5 F (36.9 C) 98.1 F (36.7 C) 98.9 F (37.2 C) 98.1 F (36.7 C)  TempSrc: Oral Oral Oral Oral  Resp: 18 16 16 16   Height:      Weight:   90.81 kg (200 lb 3.2 oz)   SpO2: 98% 98% 98% 100%   Physical Exam General: pleasant but somewhat depressed affect, weak, hard for her to sit up for lung exam Heart: RRR Lungs: no rales Abdomen: obese soft NT Extremities: right tr LE  edema; left 2 + LE edema and +++ dependent edema in thigh and buttocks L > R Dialysis Access: left thigh AVGG + bruit  Dialysis Orders: [facility] TTS 4 hr 400/800 EDW 87.5 2 K 2 Ca no profile right thigh TDC and left thigh graft (placed 10/7) Aranesp 200/week heparin 2600 no labs no Fe or VDRA NO labs - hx prior steal of left arm graft s/p ligation 12/19/14  Additional Objective Labs: Basic Metabolic Panel:  Recent Labs Lab 02/02/15 0459 02/03/15 0302 02/04/15 0415  NA 135 137 136  K 3.3* 3.9 3.5  CL 99* 100* 102  CO2 27 30 28   GLUCOSE 74 79 75  BUN 19 9 14   CREATININE 6.20* 4.19* 5.74*  CALCIUM 8.3* 8.3* 8.3*  PHOS 3.9 3.3 4.4   Liver Function Tests:  Recent Labs Lab 02/02/15 0459 02/03/15 0302 02/04/15 0415  ALBUMIN 1.5* 1.4* 1.5*   CBC:  Recent Labs Lab 01/29/15 0228 01/30/15 0308 01/31/15 0357 02/01/15 0353  WBC 6.5 6.4 4.9 7.2  HGB 10.4* 9.8* 9.3* 10.5*  HCT 31.3* 29.4* 28.8* 32.5*  MCV 89.4 91.0 91.4 91.3  PLT 152 77* 104* 88*   Blood Culture    Component Value Date/Time   SDES BLOOD LEFT HAND 01/28/2015 1342   SPECREQUEST IN PEDIATRIC BOTTLE  3CC 01/28/2015 1342   CULT NO GROWTH 5 DAYS 01/28/2015 1342   REPTSTATUS 02/02/2015 FINAL 01/28/2015  1342   Medications:   . sodium chloride   Intravenous Once  . atorvastatin  20 mg Oral q1800  . cefTAZidime (FORTAZ)  IV  2 g Intravenous Q T,Th,Sa-HD  . darbepoetin (ARANESP) injection - DIALYSIS  200 mcg Intravenous Q Thu-HD  . hydroxychloroquine  400 mg Oral Daily  . metoprolol succinate  25 mg Oral QHS  . midodrine  10 mg Oral BID WC  . multivitamin  1 tablet Oral QHS  . pantoprazole  40 mg Oral Daily  . sodium chloride  3 mL Intravenous Q12H

## 2015-02-04 NOTE — H&P (Signed)
Physical Medicine and Rehabilitation Admission H&P    Chief Complaint  Patient presents with  . Fatigue  . Shortness of Breath  : HPI: [name] is a 39 y.o. 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 Received inpatient rehabilitation services 01/07/2015 to 01/17/2015 after right MCA infarct. She was discharged to home ambulating modified independent. Patient lives with her mother and 2 children ages 84 and 38 and used a walker prior to admission. She does not have 24/7 support on discharge. One level home. Presented 01/25/2015 with low-grade fevers and placed on broad-spectrum antibiotics. Culture showed pansensitive Pseudomonas catheter line bloodstream infection. Hospital course bouts of hypotension. Neurology consulted 01/25/2015 with left leg weakness. MRI of the brain showed medial expansion of anterior right MCA territory infarct and acute right basal ganglia infarct. Patient had been on Coumadin which was recently stopped. Coumadin currently on hold with recent TEE showing no evidence of vegetations and reassess after antibiotics completed 02/09/2015 with question to repeat TEE... Tolerating a regular consistency diet. Acute on chronic anemia 8.9-10.5 and monitored. Currently maintained on IV Fortaz through 02/09/2015. Physical therapy evaluation completed and ongoing. Patient needing ongoing encouragement to participate. Request made for physical medicine rehabilitation consult. Patient was admitted for comprehensive rehabilitation program  ROS Review of Systems  Constitutional: Negative for fever and chills.  HENT: Negative for hearing loss.  Eyes: Negative for blurred vision and double vision.  Respiratory: Negative for cough.   Shortness of breath with exertion  Gastrointestinal: Positive for constipation. Negative for  nausea and vomiting.   GERD  Musculoskeletal: Positive for myalgias and joint pain.  Skin: Negative for rash.  Neurological: Positive for weakness and headaches. Negative for seizures.  Psychiatric/Behavioral:   Anxiety  All other systems reviewed and are negative   Past Medical History  Diagnosis Date  . Hypertension   . Cardiac arrhythmia   . SVT (supraventricular tachycardia) ([place])     "today, last week, 2 wk ago; maybe 1 month ago, etc; started w/in last 3-4 yrs"(07/20/2012)  . Fainting     "~ 1 month ago; probably related to SVT" (07/20/2012)  . Shortness of breath     "related to SVT episodes" (07/20/2012)  . Chest pain at rest     "related to SVT" (07/20/2012)  . Lupus (systemic lupus erythematosus) ([place])   . GERD (gastroesophageal reflux disease)   . Fibromyalgia   . Irritable bowel syndrome (IBS)   . Hypercholesterolemia   . Heart murmur     "small" (12/25/2014)  . TIA (transient ischemic attack) 12/21/2014  . Sleep apnea     "don't wear mask anymore" (12/25/2014)  . Anemia   . History of blood transfusion "several"    "related to low counts"  . Daily headache   . Arthritis     "hips" (12/25/2014)  . Anxiety     "sometimes; don't take anything for it" (12/25/2014)  . 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  Repeat biopsy early 2016 membranous w/o activity so meds weaned off. Big flare Aug '16 creat 6, 3rd biopsy DPGN, meds resumed. Ended up starting HD Sept 2016  . Stroke ([place])   . History of vascular access device     2016: Sept 9  R IJ cath per IR.  Sept  20 not candidate for fistula due to disease veins, had LUA hybrid graft placed. Sept 21 - steal syndrome, LUA AVG ligated. Oct 7 - new left femoral Gore-tex loop graft per VVS. Oct 29 - removal R IJ tunneled HD cath   Past Surgical History  Procedure Laterality Date  . Cesarean section  2001; 2010  .  Cervical biopsy  2013  . Supraventricular tachycardia ablation  07/21/12     Dr. Cristopher Peru   . Peripheral vascular catheterization N/A 12/14/2014    Procedure: Upper Extremity Venography;  Surgeon: Elam Dutch, MD;  Location: [facility];  Service: Cardiovascular;  Laterality: N/A;  . Insertion hybrid anteriovenous gortex graft Left 12/18/2014    Procedure: INSERTION GORE HYBRID ARTERIOVENOUS GRAFT LEFT AXILLO-BRACHIAL.;  Surgeon: Serafina Mitchell, MD;  Location[facility];  Service: Vascular;  Laterality: Left;  . Ligation of arteriovenous  fistula Left 12/19/2014    Procedure: LIGATION OF FISTULA;  Surgeon: Elam Dutch, MD;  Location: [facility];  Service: Vascular;  Laterality: Left;  . Appendectomy  2011  . Tubal ligation  2010  . Tee without cardioversion N/A 12/25/2014    Procedure: TRANSESOPHAGEAL ECHOCARDIOGRAM (TEE);  Surgeon: Sueanne Margarita, MD;  Location: [facility];  Service: Cardiovascular;  Laterality: N/A;  . Av fistula placement Left 01/04/2015    Procedure: INSERTION OF ARTERIOVENOUS (AV) GORE-TEX GRAFT THIGH;  Surgeon: Rosetta Posner, MD;  Location: [facility];  Service: Vascular;  Laterality: Left;  . Tee without cardioversion N/A 01/30/2015    Procedure: TRANSESOPHAGEAL ECHOCARDIOGRAM (TEE);  Surgeon: Lelon Perla, MD;  Location: [facility] ENDOSCOPY;  Service: Cardiovascular;  Laterality: N/A;   Family History  Problem Relation Age of Onset  . Cancer Mother     breast and ovarian  . BRCA 1/2 Sister    Social History:  reports that she has never smoked. She has never used smokeless tobacco. She reports that she does not drink alcohol or use illicit drugs. Allergies:  Allergies  Allergen Reactions  . Food Swelling    Red peppers   Medications Prior to Admission  Medication Sig Dispense Refill  . atorvastatin (LIPITOR) 20 MG tablet Take 1 tablet (20 mg total) by mouth daily at 6 PM. 30 tablet 2  . Calcium Carbonate Antacid (TUMS PO) Take 1-2 tablets by mouth daily as  needed (heartburn).    . cyclobenzaprine (FLEXERIL) 10 MG tablet Take 1 tablet (10 mg total) by mouth at bedtime as needed for muscle spasms. 30 tablet 0  . hydroxychloroquine (PLAQUENIL) 200 MG tablet Take 2 tablets (400 mg total) by mouth daily. 60 tablet 1  . isosorbide mononitrate (IMDUR) 30 MG 24 hr tablet Take 1 tablet (30 mg total) by mouth daily. 30 tablet 1  . metoprolol succinate (TOPROL-XL) 25 MG 24 hr tablet Take 1 tablet (25 mg total) by mouth at bedtime. 30 tablet 1  . multivitamin (RENA-VIT) TABS tablet Take 1 tablet by mouth at bedtime. 30 tablet 0  . oxyCODONE-acetaminophen (PERCOCET) 5-325 MG tablet Take 1 tablet by mouth every 6 (six) hours as needed for severe pain. 90 tablet 0  . pantoprazole (PROTONIX) 20 MG tablet Take 1 tablet (20 mg total) by mouth daily. 30 tablet 1  . vancomycin (VANCOCIN) 1 GM/200ML SOLN Inject 200 mLs (1,000 mg total) into the vein Every Tuesday,Thursday,and Saturday with dialysis. 4000 mL     Home: Home Living Family/patient expects to be discharged to:: Private residence Living Arrangements: Children, Parent (6 and  44 y.o children ) Available Help at Discharge: Family, Available 24 hours/day Type of Home: Apartment Home Access: Level entry, Ramped entrance Home Layout: One level Bathroom Shower/Tub: Tub/shower unit, Architectural technologist: Standard Bathroom Accessibility: Yes Home Equipment: Bedside commode, Walker - standard, Walker - 2 wheels  Lives With:  (lives with MOm stays at home and 2 children (6 and 15))   Functional History: Prior Function Level of Independence: Needs assistance Gait / Transfers Assistance Needed: mod I with RW ADL's / Homemaking Assistance Needed: Requires assist with IADL Comments: prior to admission 10/16, pt was fully independent   Functional Status:  Mobility: Bed Mobility Overal bed mobility: Needs Assistance Bed Mobility: Rolling, Supine to Sit Rolling: Min assist Supine to sit: Min assist, HOB  elevated Sit to supine: Max assist, +2 for physical assistance General bed mobility comments: Min assist to support LLE out of bed. VC for technique and assist for patient to pull through PTs hand to achieve EOB sitting. Able to scoot self to EOB. HOB was elevated Transfers Overall transfer level: Needs assistance Equipment used: Rolling walker (2 wheeled) Transfers: Sit to/from Stand Sit to Stand: Min assist Stand pivot transfers: Min assist General transfer comment: Min assist for boost to stand. [place] for [employer]. VC for hand placement. Pt felt lightheaded upon standing and required to sit. Attempted second time long enough for BP (see vitals tab for orthostatics). HR elevated to 145 with each attempt. Third trial patient able to stand and pivot to chair with min assist for walker control and cues for safety. HR decreases upon sitting each time. Ambulation/Gait General Gait Details: Not performed due to elevated HR and feeling lightheaded    ADL: ADL Overall ADL's : Needs assistance/impaired Eating/Feeding: Set up, Sitting, Bed level Grooming: Wash/dry hands, Wash/dry face, Oral care, Set up, Bed level Upper Body Bathing: Minimal assitance, Sitting, Bed level Lower Body Bathing: Maximal assistance, Sit to/from stand Upper Body Dressing : Moderate assistance, Sitting Lower Body Dressing: Total assistance, Sit to/from stand Toilet Transfer: Moderate assistance, Stand-pivot, BSC Toileting- Clothing Manipulation and Hygiene: Total assistance Functional mobility during ADLs: Maximal assistance, +2 for physical assistance General ADL Comments: Pt very limited and depressed.  Unable to talk pt into doing much of anything during therapy but she still feels she can go home and not to rehab.  Feel she will be very difficult to care for at home and reviewed with her at length the reasons this therapist feels rehab is necessary.  Cognition: Cognition Overall Cognitive Status: Within Functional  Limits for tasks assessed Orientation Level: Oriented X4 Cognition Arousal/Alertness: Awake/alert Behavior During Therapy: Flat affect Overall Cognitive Status: Within Functional Limits for tasks assessed Area of Impairment: Safety/judgement Safety/Judgement: Decreased awareness of safety, Decreased awareness of deficits General Comments: Pt feels she is okay to go home despite all the assist she is requiring.  Mother would like her to get rehab.  pt stated she would go but really does not want to.  Insight limited.  Physical Exam: Blood pressure 127/74, pulse 97, temperature 98 F (36.7 C), temperature source Oral, resp. rate 18, height _0  (1.6 m), weight 90.81 kg (200 lb 3.2 oz), last menstrual period 01/21/2015, SpO2 100 %. Physical Exam Constitutional: She is oriented to person, place, and time. She appears well-developed and well-nourished.  HENT:  Head: Normocephalic and atraumatic.  Eyes: EOM are normal.  Injected sclera  Neck: Normal range of motion. Neck supple. No thyromegaly present.  Cardiovascular: Normal rate and regular rhythm.  ?  Murmur  Respiratory: Effort normal and breath sounds normal.  GI: Soft. Bowel sounds are normal. She exhibits no distension.  Musculoskeletal: She exhibits no edema or tenderness.  Strength 4/5 throughout  Neurological: She is alert and oriented to person, place, and time.  Mood is flat but appropriate.  Follows simple commands Sensation intact to light touch  Skin: Skin is warm and dry.  Psychiatric: She has a normal mood and affect. Her behavior is normal   Results for orders placed or performed during the hospital encounter of 01/25/15 (from the past 48 hour(s))  Renal function panel     Status: Abnormal   Collection Time: 02/03/15  3:02 AM  Result Value Ref Range   Sodium 137 135 - 145 mmol/L   Potassium 3.9 3.5 - 5.1 mmol/L   Chloride 100 (L) 101 - 111 mmol/L   CO2 30 22 - 32 mmol/L   Glucose, Bld 79 65 - 99 mg/dL   BUN 9 6  - 20 mg/dL   Creatinine, Ser 4.19 (H) 0.44 - 1.00 mg/dL   Calcium 8.3 (L) 8.9 - 10.3 mg/dL   Phosphorus 3.3 2.5 - 4.6 mg/dL   Albumin 1.4 (L) 3.5 - 5.0 g/dL   GFR calc non Af Amer 12 (L) >60 mL/min   GFR calc Af Amer 14 (L) >60 mL/min    Comment: (NOTE) The eGFR has been calculated using the CKD EPI equation. This calculation has not been validated in all clinical situations. eGFR's persistently <60 mL/min signify possible Chronic Kidney Disease.    Anion gap 7 5 - 15  Renal function panel     Status: Abnormal   Collection Time: 02/04/15  4:15 AM  Result Value Ref Range   Sodium 136 135 - 145 mmol/L   Potassium 3.5 3.5 - 5.1 mmol/L   Chloride 102 101 - 111 mmol/L   CO2 28 22 - 32 mmol/L   Glucose, Bld 75 65 - 99 mg/dL   BUN 14 6 - 20 mg/dL   Creatinine, Ser 5.74 (H) 0.44 - 1.00 mg/dL   Calcium 8.3 (L) 8.9 - 10.3 mg/dL   Phosphorus 4.4 2.5 - 4.6 mg/dL   Albumin 1.5 (L) 3.5 - 5.0 g/dL   GFR calc non Af Amer 8 (L) >60 mL/min   GFR calc Af Amer 10 (L) >60 mL/min    Comment: (NOTE) The eGFR has been calculated using the CKD EPI equation. This calculation has not been validated in all clinical situations. eGFR's persistently <60 mL/min signify possible Chronic Kidney Disease.    Anion gap 6 5 - 15   No results found.     Medical Problem List and Plan: 1. Functional deficits secondary to right basal ganglia infarct and debility from recent bacteremia. 2.  DVT Prophylaxis/Anticoagulation: Chronic Coumadin on hold until antibiotics completed 02/09/2015 reassess 3. Pain Management: Oxycodone and Flexeril as needed. Monitor with increased mobility 4. ID/Pseudomonas sepsis. Continue Tressie Ellis 02/09/2015 5. Neuropsych: This patient is capable of making decisions on her own behalf. 6. Skin/Wound Care: Routine skin checks 7. Fluids/Electrolytes/Nutrition: Routine high nose with follow-up chemistries 8. End-stage renal disease/lupus nephritis. Continue hemodialysis as per renal  services 9. Orthostatic hypotension.Midodrine 10 mg twice a day 10. Chronic anemia. Continue Aranesp 11. Hyperlipidemia. Lipitor  Post Admission Physician Evaluation: 1. Functional deficits secondary  to right basal ganglia infarct and debility from recent bacteremia. 2. Patient is admitted to receive collaborative, interdisciplinary care between the physiatrist, rehab nursing staff, and therapy team. 3. Patient's level of  medical complexity and substantial therapy needs in context of that medical necessity cannot be provided at a lesser intensity of care such as a SNF. 4. Patient has experienced substantial functional loss from his/her baseline which was documented above under the "Functional History" and "Functional Status" headings.  Judging by the patient's diagnosis, physical exam, and functional history, the patient has potential for functional progress which will result in measurable gains while on inpatient rehab.  These gains will be of substantial and practical use upon discharge  in facilitating mobility and self-care at the household level. 5. Physiatrist will provide 24 hour management of medical needs as well as oversight of the therapy plan/treatment and provide guidance as appropriate regarding the interaction of the two. 6. 24 hour rehab nursing will assist with bowel management, safety, disease management, medication administration and patient education and help integrate therapy concepts, techniques,education, etc. 7. PT will assess and treat for/with: Lower extremity strength, range of motion, stamina, balance, functional mobility, safety, adaptive techniques and equipment, woundcare, coping skills, pain control, stroke education.   Goals are: Mod I/Supervison. 8. OT will assess and treat for/with: ADL's, functional mobility, safety, upper extremity strength, adaptive techniques and equipment, wound mgt, ego support, and community reintegration.   Goals are: Mod I/Supervision.  Therapy may proceed with showering this patient. 9. Case Management and Social Worker will assess and treat for psychological issues and discharge planning. 10. Team conference will be held weekly to assess progress toward goals and to determine barriers to discharge. 11. Patient will receive at least 3 hours of therapy per day at least 5 days per week. 12. ELOS: 16-19 days.       13. Prognosis:  good   Delice Lesch, MD 02/04/2015

## 2015-02-04 NOTE — Progress Notes (Signed)
Subjective: No acute events overnight. Patient was very frustrated today about her options for going home being limited and against recommendations for her safety and PT progress. After some discussion she appreciates what is being stated and does agree to working with CIR since it is needed before she can go home and be successful at caring for herself adequately.  Objective: Vital signs in last 24 hours: Filed Vitals:   02/03/15 1746 02/03/15 2100 02/04/15 0500 02/04/15 0955  BP: 126/65 131/74 116/69 127/74  Pulse: 94 94 90 97  Temp: 98.1 F (36.7 C) 98.9 F (37.2 C) 98.1 F (36.7 C) 98 F (36.7 C)  TempSrc: Oral Oral Oral Oral  Resp: 16 16 16 18   Height:      Weight:  90.81 kg (200 lb 3.2 oz)    SpO2: 98% 98% 100% 100%   Weight change: -2.29 kg (-5 lb 0.8 oz)  Intake/Output Summary (Last 24 hours) at 02/04/15 1548 Last data filed at 02/04/15 1500  Gross per 24 hour  Intake    360 ml  Output      0 ml  Net    360 ml   General: alert, sitting up in bed, NAD HEENT: [employer]/AT, EOMI, mucus membranes moist  CV: mild tachycardia, regular rhythm, no murmurs, rubs, or gallops Pulm: CTAB, no wheezes or crackles. Ext: LLE AVG with palpable thrill. She has left lower extremity edema from her thigh down to ankle.  Psych: Blunt affect, appropriate thought content and speech.  Lab Results: Basic Metabolic Panel:  Recent Labs Lab 02/03/15 0302 02/04/15 0415  NA 137 136  K 3.9 3.5  CL 100* 102  CO2 30 28  GLUCOSE 79 75  BUN 9 14  CREATININE 4.19* 5.74*  CALCIUM 8.3* 8.3*  PHOS 3.3 4.4   Liver Function Tests:  Recent Labs Lab 02/03/15 0302 02/04/15 0415  ALBUMIN 1.4* 1.5*   CBC:  Recent Labs Lab 01/31/15 0357 02/01/15 0353  WBC 4.9 7.2  HGB 9.3* 10.5*  HCT 28.8* 32.5*  MCV 91.4 91.3  PLT 104* 88*   CBG: No results for input(s): GLUCAP in the last 168 hours. Thyroid Function Tests:  Recent Labs Lab 01/31/15 1000  TSH 8.798*   Coagulation: No results  for input(s): LABPROT, INR in the last 168 hours.  Micro Results: Recent Results (from the past 240 hour(s))  MRSA PCR Screening     Status: None   Collection Time: 01/25/15  8:35 PM  Result Value Ref Range Status   MRSA by PCR NEGATIVE NEGATIVE Final    Comment:        The GeneXpert MRSA Assay (FDA approved for NASAL specimens only), is one component of a comprehensive MRSA colonization surveillance program. It is not intended to diagnose MRSA infection nor to guide or monitor treatment for MRSA infections.   Cath Tip Culture     Status: None   Collection Time: 01/26/15  5:23 PM  Result Value Ref Range Status   Specimen Description CATH TIP RIGHT THIGH HEMODIALYSIS CATHETER  Final   Special Requests NONE  Final   Culture LT15 Performed at [facility]   Final   Report Status 01/30/2015 FINAL  Final  C difficile quick scan w PCR reflex     Status: None   Collection Time: 01/27/15  9:46 AM  Result Value Ref Range Status   C Diff antigen NEGATIVE NEGATIVE Final   C Diff toxin NEGATIVE NEGATIVE Final   C Diff interpretation Negative  for toxigenic C. difficile  Final  Culture, blood (routine x 2)     Status: None   Collection Time: 01/28/15  1:42 PM  Result Value Ref Range Status   Specimen Description BLOOD LEFT HAND  Final   Special Requests IN PEDIATRIC BOTTLE  3CC  Final   Culture NO GROWTH 5 DAYS  Final   Report Status 02/02/2015 FINAL  Final   Medications: I have reviewed the patient's current medications. Scheduled Meds: . sodium chloride   Intravenous Once  . atorvastatin  20 mg Oral q1800  . cefTAZidime (FORTAZ)  IV  2 g Intravenous Q T,Th,Sa-HD  . darbepoetin (ARANESP) injection - DIALYSIS  200 mcg Intravenous Q Thu-HD  . feeding supplement (PRO-STAT SUGAR FREE 64)  30 mL Oral BID  . hydroxychloroquine  400 mg Oral Daily  . metoprolol succinate  25 mg Oral QHS  . midodrine  10 mg Oral BID WC  . multivitamin  1 tablet Oral QHS  . pantoprazole  40 mg  Oral Daily  . sodium chloride  3 mL Intravenous Q12H   Continuous Infusions:  PRN Meds:.sodium chloride, acetaminophen **OR** acetaminophen, cyclobenzaprine, loperamide, ondansetron **OR** ondansetron (ZOFRAN) IV, oxyCODONE-acetaminophen, polyethylene glycol, trimethobenzamide Assessment/Plan: Pansensitive Pseudomonas Catheter Line Blood Stream Infection - Repeat cultures and catheter culture negative - Continue ceftazidime abx post-HD until 11/12  Hypotension - HD ultrafiltration limited by hypotension and dyspnea. BP keeps dropping to 60s-80s during HD. Has required Midodrine here.  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 - Appreciate nephrology recs - Monitor renal function qdaily while IP  Anemia of Chronic Disease - Continue aranesp to maintain target Hg >10 SLE: Continue home plaquenil 400 mg daily Hyperlipidemia: Continue home lipitor 20 mg daily  OSA: Patient does not have any desire to use CPAP, will discontinue ordering as has refused x3 nights GERD: Continue protonix 40 mg daily  Diet: Renal with 1200 mL restriction  DVT ppx: SCDs FULL CODE  Dispo: Anticipate discharge to CIR later today.  The patient does have a current PCP Lin Landsman, MD) and does need an [facility] hospital follow-up appointment after discharge.  The patient does have transportation limitations that hinder transportation  to clinic appointments.   LOS: 10 days   Collier Salina, MD  02/04/2015, 3:48 PM

## 2015-02-04 NOTE — Discharge Instructions (Signed)
You were admitted after having severe illness from a bacterial infection related to your temporary right thigh hemodialysis catheter. This initially required intensive care support but you improved greatly and are medically clear to leave the hospital and continue healing through rehabilitation.  At this hospital admission your blood pressure has been low to normal without the need for many antihypertensives medicines. At discharge recommend taking the following to help support your heart health:  Toprol-XL 25mg  by mouth once daily Midodrine 10mg  by mouth twice daily  Your outpatient dialysis can continue to adjust their goals and treatment to relieve your swelling and congestions more as you tolerate this better. You will continue to receive antibiotics with your dialysis sessions planned until 11/12.

## 2015-02-04 NOTE — Progress Notes (Signed)
02/04/2015 5:10 PM  Report called to [facility] on 4MW.  Pt has bed available on 4M05. [name]

## 2015-02-04 NOTE — PMR Pre-admission (Signed)
PMR Admission Coordinator Pre-Admission Assessment  Patient: [name] is an 39 y.o., female MRN: [id number] DOB: 05-20-1975 Height: _0  (160 cm) Weight: 90.81 kg (200 lb 3.2 oz)              Insurance Information HMO:    PPO:       PCP:       IPA:       80/20:       OTHER:   PRIMARY:  Medicaid [place] access      Policy#: [id number]      Subscriber: Bettye Boeck CM Name:        Phone#:       Fax#:   Pre-Cert#:        Employer:  FT until 11/26/14 Benefits:  Phone #: [phone]     Name: Automated Eff. Date: Eligible 02/04/15     Deduct:        Out of Pocket Max:        Life Max:   CIR:        SNF:   Outpatient:       Co-Pay:   Home Health:        Co-Pay:   DME:       Co-Pay:   Providers:   Medicaid Application Date:        Case Manager:   Disability Application Date:        Case Worker:    Emergency Contact Information Contact Information    Name Relation Home Work Mobile   Davis,Nichole Sister   [phone]   Hilton,Denise Mother [phone]  [phone]     Current Medical History  Patient Admitting Diagnosis:  Debility/sepsis  History of Present Illness: A 39 y.o. 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 Received inpatient rehabilitation services 01/07/2015 to 01/17/2015 after right MCA infarct. She was discharged to home ambulating modified independent. Patient lives with her mother and 2 children ages 4 and 24 and used a walker prior to admission. She does not have 24/7 support on discharge. One level home. Presented 01/25/2015 with low-grade fevers and placed on broad-spectrum antibiotics. Culture showed pansensitive Pseudomonas catheter line bloodstream infection. Hospital course bouts of hypotension. Neurology consulted 01/25/2015 with left leg weakness. MRI of the brain showed medial expansion of anterior  right MCA territory infarct and acute right basal ganglia infarct. Patient had been on Coumadin which was recently stopped. Coumadin currently on hold with recent TEE showing no evidence of vegetations and reassess after antibiotics completed.. Tolerating a regular consistency diet. Acute on chronic anemia 8.9-10.5 and monitored. Currently maintained on IV Fortaz through 02/09/2015. Physical therapy evaluation completed and ongoing. Patient needing ongoing encouragement to participate. Request made for physical medicine rehabilitation consult.    Total: 5=NIH  Past Medical History  Past Medical History  Diagnosis Date  . Hypertension   . Cardiac arrhythmia   . SVT (supraventricular tachycardia) ([place])     "today, last week, 2 wk ago; maybe 1 month ago, etc; started w/in last 3-4 yrs"(07/20/2012)  . Fainting     "~ 1 month ago; probably related to SVT" (07/20/2012)  . Shortness of breath     "related to SVT episodes" (07/20/2012)  . Chest pain at rest     "related to SVT" (07/20/2012)  . Lupus (systemic lupus erythematosus) ([place])   . GERD (gastroesophageal reflux disease)   . Fibromyalgia   .  Irritable bowel syndrome (IBS)   . Hypercholesterolemia   . Heart murmur     "small" (12/25/2014)  . TIA (transient ischemic attack) 12/21/2014  . Sleep apnea     "don't wear mask anymore" (12/25/2014)  . Anemia   . History of blood transfusion "several"    "related to low counts"  . Daily headache   . Arthritis     "hips" (12/25/2014)  . Anxiety     "sometimes; don't take anything for it" (12/25/2014)  . ESRD (end stage renal disease) on dialysis ([place]) since 12/07/2014    Diagnosed Aug 2014 w SLE nephritis DPGN by biopsy, rx cellcept/ steroids.  Repeat biopsy early 2016 membranous w/o activity so meds weaned off. Big flare Aug '16 creat 6, 3rd biopsy DPGN, meds resumed. Ended up starting HD Sept 2016  . Stroke ([place])   . History of vascular access device     2016: Sept 9  R IJ cath per IR.  Sept 20 not  candidate for fistula due to disease veins, had LUA hybrid graft placed. Sept 21 - steal syndrome, LUA AVG ligated. Oct 7 - new left femoral Gore-tex loop graft per VVS. Oct 29 - removal R IJ tunneled HD cath    Family History  family history includes BRCA 1/2 in her sister; Cancer in her mother.  Prior Rehab/Hospitalizations: Recent CIR admission 10/10 to 01/17/15.  Was set up to have [place] follow up at home.  Became ill and had to return to the hospital.  Has the patient had major surgery during 100 days prior to admission? Yes.  A tunnelled graft was placed in her leg for HD.  Current Medications   Current facility-administered medications:  .  0.9 %  sodium chloride infusion, 250 mL, Intravenous, PRN, [place] Nickel, MD .  0.9 %  sodium chloride infusion, , Intravenous, Once, Alric Seton, PA-C .  acetaminophen (TYLENOL) tablet 650 mg, 650 mg, Oral, Q6H PRN, 650 mg at 01/26/15 1711 **OR** acetaminophen (TYLENOL) suppository 650 mg, 650 mg, Rectal, Q6H PRN, Milagros Loll, MD .  atorvastatin (LIPITOR) tablet 20 mg, 20 mg, Oral, q1800, Milagros Loll, MD, 20 mg at 02/03/15 1701 .  cefTAZidime (FORTAZ) 2 g in dextrose 5 % 50 mL IVPB, 2 g, Intravenous, Q T,Th,Sa-HD, Otilio Miu, RPH, 2 g at 02/02/15 1011 .  cyclobenzaprine (FLEXERIL) tablet 10 mg, 10 mg, Oral, QHS PRN, [place] Nickel, MD .  Darbepoetin Alfa (ARANESP) injection 200 mcg, 200 mcg, Intravenous, Q Thu-HD, Alric Seton, PA-C .  feeding supplement (PRO-STAT SUGAR FREE 64) liquid 30 mL, 30 mL, Oral, BID, Alric Seton, PA-C, 30 mL at 02/04/15 1019 .  hydroxychloroquine (PLAQUENIL) tablet 400 mg, 400 mg, Oral, Daily, Milagros Loll, MD, 400 mg at 02/04/15 1019 .  loperamide (IMODIUM) capsule 2 mg, 2 mg, Oral, Q2H PRN, Collene Gobble, MD, 2 mg at 01/27/15 2242 .  metoprolol succinate (TOPROL-XL) 24 hr tablet 25 mg, 25 mg, Oral, QHS, Milagros Loll, MD, 25 mg at 02/03/15 2136 .  midodrine (PROAMATINE) tablet 10 mg, 10  mg, Oral, BID WC, Roney Jaffe, MD, 10 mg at 02/04/15 0742 .  multivitamin (RENA-VIT) tablet 1 tablet, 1 tablet, Oral, QHS, Milagros Loll, MD, 1 tablet at 02/03/15 2136 .  ondansetron (ZOFRAN) tablet 4 mg, 4 mg, Oral, Q6H PRN **OR** ondansetron (ZOFRAN) injection 4 mg, 4 mg, Intravenous, Q6H PRN, Milagros Loll, MD, 4 mg at 01/29/15 1619 .  oxyCODONE-acetaminophen (PERCOCET/ROXICET) 5-325 MG  per tablet 1 tablet, 1 tablet, Oral, Q6H PRN, Milagros Loll, MD, 1 tablet at 02/01/15 1856 .  pantoprazole (PROTONIX) EC tablet 40 mg, 40 mg, Oral, Daily, Marjan Rabbani, MD, 40 mg at 02/04/15 1019 .  polyethylene glycol (MIRALAX / GLYCOLAX) packet 17 g, 17 g, Oral, Daily PRN, Milagros Loll, MD .  sodium chloride 0.9 % injection 3 mL, 3 mL, Intravenous, Q12H, Milagros Loll, MD, 3 mL at 02/04/15 1020 .  trimethobenzamide (TIGAN) capsule 300 mg, 300 mg, Oral, Q8H PRN, Collene Gobble, MD, 300 mg at 01/27/15 2242  Patients Current Diet: Diet regular Room service appropriate?: Yes; Fluid consistency:: Thin; Fluid restriction:: 1200 mL Fluid Diet - low sodium heart healthy  Precautions / Restrictions Precautions Precautions: Fall Precaution Comments: Pt's femoral cath is tunneled therefore she is able to get OOB. Restrictions Weight Bearing Restrictions: No   Has the patient had 2 or more falls or a fall with injury in the past year?No.  Slid off the bed X 1 with no injury.  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    Home Assistive Devices / Equipment Home Assistive Devices/Equipment: Wheelchair Home Equipment: Bedside commode, Walker - standard, Environmental consultant - 2 wheels  Prior Device Use: Indicate devices/aids used by the patient prior to current illness, exacerbation or injury? Walker  Prior Functional Level Prior Function Level of Independence: Needs assistance Gait / Transfers Assistance Needed: mod I with RW ADL's /  Homemaking Assistance Needed: Requires assist with IADL Comments: prior to admission 10/16, pt was fully independent   Self Care: Did the patient need help bathing, dressing, using the toilet or eating?  Independent  Indoor Mobility: Did the patient need assistance with walking from room to room (with or without device)? Independent  Stairs: Did the patient need assistance with internal or external stairs (with or without device)? Needed some help  Functional Cognition: Did the patient need help planning regular tasks such as shopping or remembering to take medications? Needed some help  Current Functional Level Cognition  Overall Cognitive Status: Within Functional Limits for tasks assessed Orientation Level: Oriented X4 Safety/Judgement: Decreased awareness of safety, Decreased awareness of deficits General Comments: Pt feels she is okay to go home despite all the assist she is requiring.  Mother would like her to get rehab.  pt stated she would go but really does not want to.  Insight limited.    Extremity Assessment (includes Sensation/Coordination)  Upper Extremity Assessment: Defer to OT evaluation LUE Deficits / Details: AROM shoulder flexion 0-110 degrees, all other joints AROM WFLS.  Strength 3+/5 in shoulder, elbow, and grip  Lower Extremity Assessment: RLE deficits/detail, LLE deficits/detail RLE Deficits / Details: grossly 3+/5 LLE Deficits / Details: grossly 3/5    ADLs  Overall ADL's : Needs assistance/impaired Eating/Feeding: Set up, Sitting, Bed level Grooming: Wash/dry hands, Wash/dry face, Oral care, Set up, Bed level Upper Body Bathing: Minimal assitance, Sitting, Bed level Lower Body Bathing: Maximal assistance, Sit to/from stand Upper Body Dressing : Moderate assistance, Sitting Lower Body Dressing: Total assistance, Sit to/from stand Toilet Transfer: Moderate assistance, Stand-pivot, BSC Toileting- Clothing Manipulation and Hygiene: Total  assistance Functional mobility during ADLs: Maximal assistance, +2 for physical assistance General ADL Comments: Pt very limited and depressed.  Unable to talk pt into doing much of anything during therapy but she still feels she can go home and not to rehab.  Feel she will be very  difficult to care for at home and reviewed with her at length the reasons this therapist feels rehab is necessary.    Mobility  Overal bed mobility: Needs Assistance Bed Mobility: Rolling, Supine to Sit Rolling: Min assist Supine to sit: Min assist, HOB elevated Sit to supine: Max assist, +2 for physical assistance General bed mobility comments: Min assist to support LLE out of bed. VC for technique and assist for patient to pull through PTs hand to achieve EOB sitting. Able to scoot self to EOB. HOB was elevated    Transfers  Overall transfer level: Needs assistance Equipment used: Rolling walker (2 wheeled) Transfers: Sit to/from Stand Sit to Stand: Min assist Stand pivot transfers: Min assist General transfer comment: Min assist for boost to stand. [place] for [employer]. VC for hand placement. Pt felt lightheaded upon standing and required to sit. Attempted second time long enough for BP (see vitals tab for orthostatics). HR elevated to 145 with each attempt. Third trial patient able to stand and pivot to chair with min assist for walker control and cues for safety. HR decreases upon sitting each time.    Ambulation / Gait / Stairs / Wheelchair Mobility  Ambulation/Gait General Gait Details: Not performed due to elevated HR and feeling lightheaded    Posture / Balance Dynamic Sitting Balance Sitting balance - Comments: Pt leans from posterior to the L and needed mod assist to recover. Balance Overall balance assessment: Needs assistance Sitting-balance support: Bilateral upper extremity supported, Feet supported Sitting balance-Leahy Scale: Poor Sitting balance - Comments: Pt leans from posterior to the L and  needed mod assist to recover. Postural control: Posterior lean, Left lateral lean Standing balance support: Bilateral upper extremity supported, During functional activity Standing balance-Leahy Scale: Poor Standing balance comment: pt refused to stand despite encouragement.    Special needs/care consideration BiPAP/CPAP No CPM No Continuous Drip IV No Dialysis Yes        Days T-TH-Sat Life Vest No Oxygen No Special Bed No Trach Size No Wound Vac (area) No     Skin No                             Bowel mgmt: Patient states last BM today, 02/04/15.  Documentation last BM was 02/03/15 Bladder mgmt: Voids 1 X a day Diabetic mgmt No    Previous Home Environment Living Arrangements: Children, Parent (6 and 78 y.o children )  Lives With:  (lives with MOm stays at home and 2 children (6 and 15)) Available Help at Discharge: Family, Available 24 hours/day Type of Home: Apartment Home Layout: One level Home Access: Level entry, Ramped entrance Bathroom Shower/Tub: Tub/shower unit, Architectural technologist: Standard Bathroom Accessibility: Yes How Accessible: Accessible via walker, Accessible via wheelchair [place]: Yes Type of [place]: Home RN, [place] (if known): [facility]  Discharge Living Setting Plans for Discharge Living Setting: Lives with (comment), Apartment (Lives with mom and 2 children.) Type of Home at Discharge: Apartment Discharge Home Layout: One level Discharge Home Access: Level entry Discharge Bathroom Shower/Tub: Tub/shower unit Discharge Bathroom Toilet: Standard Discharge Bathroom Accessibility: Yes How Accessible: Accessible via walker Does the patient have any problems obtaining your medications?: No  Social/Family/Support Systems Patient Roles: Parent, Other (Comment) (Has her mother, 73 yo daughter, 65 yo son.) Contact Information: Algis Greenhouse - mother Anticipated Caregiver: mom Anticipated Caregiver's Contact  Information: Langley Gauss - mom [phone] Ability/Limitations of  Caregiver: Mom does not work and can Veterinary surgeon Availability: 24/7 Discharge Plan Discussed with Primary Caregiver: Yes Is Caregiver In Agreement with Plan?: Yes Does Caregiver/Family have Issues with Lodging/Transportation while Pt is in Rehab?: No  Goals/Additional Needs Patient/Family Goal for Rehab: PT/OT mod I and supervision goals Expected length of stay: 16-19 days Cultural Considerations: [employer] Dietary Needs: Regular, thin liquids diet Equipment Needs: TBD Special Service Needs: Has HD T-Th-Sat and goes to HD on [address]. Pt/Family Agrees to Admission and willing to participate: Yes Program Orientation Provided & Reviewed with Pt/Caregiver Including Roles  & Responsibilities: Yes  Decrease burden of Care through IP rehab admission: N/A  Possible need for SNF placement upon discharge: Not planned  Patient Condition: [name] condition remains as documented in the consult dated 02/04/15, in which the Rehabilitation Physician determined and documented that the patient's condition is appropriate for intensive rehabilitative care in an inpatient rehabilitation facility. Will admit to inpatient rehab today.  Preadmission Screen Completed By:  Retta Diones, 02/04/2015 3:43 PM ______________________________________________________________________   Discussed status with Dr. Posey Pronto on 02/04/15 at 1543 and received telephone approval for admission today.  Admission Coordinator:  Retta Diones, time1543/Date11/7/16

## 2015-02-04 NOTE — Progress Notes (Signed)
PT Cancellation Note  Patient Details Name: [name] MRN: [id number] DOB: [birth year]   Cancelled Treatment:    Reason Eval/Treat Not Completed: Patient declined, no reason specified. Pt reporting she is just not feeling well today. Encouragement offered but pt continues to decline participation.    [name] 02/04/2015, 10:51 AM

## 2015-02-04 NOTE — H&P (View-Only) (Signed)
Physical Medicine and Rehabilitation Admission H&P    Chief Complaint  Patient presents with  . Fatigue  . Shortness of Breath  : HPI: [name] is a 39 y.o. 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 Received inpatient rehabilitation services 01/07/2015 to 01/17/2015 after right MCA infarct. She was discharged to home ambulating modified independent. Patient lives with her mother and 2 children ages 47 and 22 and used a walker prior to admission. She does not have 24/7 support on discharge. One level home. Presented 01/25/2015 with low-grade fevers and placed on broad-spectrum antibiotics. Culture showed pansensitive Pseudomonas catheter line bloodstream infection. Hospital course bouts of hypotension. Neurology consulted 01/25/2015 with left leg weakness. MRI of the brain showed medial expansion of anterior right MCA territory infarct and acute right basal ganglia infarct. Patient had been on Coumadin which was recently stopped. Coumadin currently on hold with recent TEE showing no evidence of vegetations and reassess after antibiotics completed 02/09/2015 with question to repeat TEE... Tolerating a regular consistency diet. Acute on chronic anemia 8.9-10.5 and monitored. Currently maintained on IV Fortaz through 02/09/2015. Physical therapy evaluation completed and ongoing. Patient needing ongoing encouragement to participate. Request made for physical medicine rehabilitation consult. Patient was admitted for comprehensive rehabilitation program  ROS Review of Systems  Constitutional: Negative for fever and chills.  HENT: Negative for hearing loss.  Eyes: Negative for blurred vision and double vision.  Respiratory: Negative for cough.   Shortness of breath with exertion  Gastrointestinal: Positive for constipation. Negative for  nausea and vomiting.   GERD  Musculoskeletal: Positive for myalgias and joint pain.  Skin: Negative for rash.  Neurological: Positive for weakness and headaches. Negative for seizures.  Psychiatric/Behavioral:   Anxiety  All other systems reviewed and are negative   Past Medical History  Diagnosis Date  . Hypertension   . Cardiac arrhythmia   . SVT (supraventricular tachycardia) ([place])     "today, last week, 2 wk ago; maybe 1 month ago, etc; started w/in last 3-4 yrs"(07/20/2012)  . Fainting     "~ 1 month ago; probably related to SVT" (07/20/2012)  . Shortness of breath     "related to SVT episodes" (07/20/2012)  . Chest pain at rest     "related to SVT" (07/20/2012)  . Lupus (systemic lupus erythematosus) ([place])   . GERD (gastroesophageal reflux disease)   . Fibromyalgia   . Irritable bowel syndrome (IBS)   . Hypercholesterolemia   . Heart murmur     "small" (12/25/2014)  . TIA (transient ischemic attack) 12/21/2014  . Sleep apnea     "don't wear mask anymore" (12/25/2014)  . Anemia   . History of blood transfusion "several"    "related to low counts"  . Daily headache   . Arthritis     "hips" (12/25/2014)  . Anxiety     "sometimes; don't take anything for it" (12/25/2014)  . 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  Repeat biopsy early 2016 membranous w/o activity so meds weaned off. Big flare Aug '16 creat 6, 3rd biopsy DPGN, meds resumed. Ended up starting HD Sept 2016  . Stroke ([place])   . History of vascular access device     2016: Sept 9  R IJ cath per IR.  Sept  20 not candidate for fistula due to disease veins, had LUA hybrid graft placed. Sept 21 - steal syndrome, LUA AVG ligated. Oct 7 - new left femoral Gore-tex loop graft per [facility]. Oct 29 - removal R IJ tunneled HD cath   Past Surgical History  Procedure Laterality Date  . Cesarean section  2001; 2010  .  Cervical biopsy  2013  . Supraventricular tachycardia ablation  07/21/12     Dr. Cristopher Peru   . Peripheral vascular catheterization N/A 12/14/2014    Procedure: Upper Extremity Venography;  Surgeon: Elam Dutch, MD;  Location: [facility];  Service: Cardiovascular;  Laterality: N/A;  . Insertion hybrid anteriovenous gortex graft Left 12/18/2014    Procedure: INSERTION GORE HYBRID ARTERIOVENOUS GRAFT LEFT AXILLO-BRACHIAL.;  Surgeon: Serafina Mitchell, MD;  Location[facility];  Service: Vascular;  Laterality: Left;  . Ligation of arteriovenous  fistula Left 12/19/2014    Procedure: LIGATION OF FISTULA;  Surgeon: Elam Dutch, MD;  Location: [facility];  Service: Vascular;  Laterality: Left;  . Appendectomy  2011  . Tubal ligation  2010  . Tee without cardioversion N/A 12/25/2014    Procedure: TRANSESOPHAGEAL ECHOCARDIOGRAM (TEE);  Surgeon: Sueanne Margarita, MD;  Location: [facility];  Service: Cardiovascular;  Laterality: N/A;  . Av fistula placement Left 01/04/2015    Procedure: INSERTION OF ARTERIOVENOUS (AV) GORE-TEX GRAFT THIGH;  Surgeon: Rosetta Posner, MD;  Location: [place];  Service: Vascular;  Laterality: Left;  . Tee without cardioversion N/A 01/30/2015    Procedure: TRANSESOPHAGEAL ECHOCARDIOGRAM (TEE);  Surgeon: Lelon Perla, MD;  Location: [facility];  Service: Cardiovascular;  Laterality: N/A;   Family History  Problem Relation Age of Onset  . Cancer Mother     breast and ovarian  . BRCA 1/2 Sister    Social History:  reports that she has never smoked. She has never used smokeless tobacco. She reports that she does not drink alcohol or use illicit drugs. Allergies:  Allergies  Allergen Reactions  . Food Swelling    Red peppers   Medications Prior to Admission  Medication Sig Dispense Refill  . atorvastatin (LIPITOR) 20 MG tablet Take 1 tablet (20 mg total) by mouth daily at 6 PM. 30 tablet 2  . Calcium Carbonate Antacid (TUMS PO) Take 1-2 tablets by mouth daily as  needed (heartburn).    . cyclobenzaprine (FLEXERIL) 10 MG tablet Take 1 tablet (10 mg total) by mouth at bedtime as needed for muscle spasms. 30 tablet 0  . hydroxychloroquine (PLAQUENIL) 200 MG tablet Take 2 tablets (400 mg total) by mouth daily. 60 tablet 1  . isosorbide mononitrate (IMDUR) 30 MG 24 hr tablet Take 1 tablet (30 mg total) by mouth daily. 30 tablet 1  . metoprolol succinate (TOPROL-XL) 25 MG 24 hr tablet Take 1 tablet (25 mg total) by mouth at bedtime. 30 tablet 1  . multivitamin (RENA-VIT) TABS tablet Take 1 tablet by mouth at bedtime. 30 tablet 0  . oxyCODONE-acetaminophen (PERCOCET) 5-325 MG tablet Take 1 tablet by mouth every 6 (six) hours as needed for severe pain. 90 tablet 0  . pantoprazole (PROTONIX) 20 MG tablet Take 1 tablet (20 mg total) by mouth daily. 30 tablet 1  . vancomycin (VANCOCIN) 1 GM/200ML SOLN Inject 200 mLs (1,000 mg total) into the vein Every Tuesday,Thursday,and Saturday with dialysis. 4000 mL     Home: Home Living Family/patient expects to be discharged to:: Private residence Living Arrangements: Children, Parent (6 and  71 y.o children ) Available Help at Discharge: Family, Available 24 hours/day Type of Home: Apartment Home Access: Level entry, Ramped entrance Home Layout: One level Bathroom Shower/Tub: Tub/shower unit, Architectural technologist: Standard Bathroom Accessibility: Yes Home Equipment: Bedside commode, Walker - standard, Walker - 2 wheels  Lives With:  (lives with MOm stays at home and 2 children (6 and 15))   Functional History: Prior Function Level of Independence: Needs assistance Gait / Transfers Assistance Needed: mod I with RW ADL's / Homemaking Assistance Needed: Requires assist with IADL Comments: prior to admission 10/16, pt was fully independent   Functional Status:  Mobility: Bed Mobility Overal bed mobility: Needs Assistance Bed Mobility: Rolling, Supine to Sit Rolling: Min assist Supine to sit: Min assist, HOB  elevated Sit to supine: Max assist, +2 for physical assistance General bed mobility comments: Min assist to support LLE out of bed. VC for technique and assist for patient to pull through PTs hand to achieve EOB sitting. Able to scoot self to EOB. HOB was elevated Transfers Overall transfer level: Needs assistance Equipment used: Rolling walker (2 wheeled) Transfers: Sit to/from Stand Sit to Stand: Min assist Stand pivot transfers: Min assist General transfer comment: Min assist for boost to stand. [place] for [employer]. VC for hand placement. Pt felt lightheaded upon standing and required to sit. Attempted second time long enough for BP (see vitals tab for orthostatics). HR elevated to 145 with each attempt. Third trial patient able to stand and pivot to chair with min assist for walker control and cues for safety. HR decreases upon sitting each time. Ambulation/Gait General Gait Details: Not performed due to elevated HR and feeling lightheaded    ADL: ADL Overall ADL's : Needs assistance/impaired Eating/Feeding: Set up, Sitting, Bed level Grooming: Wash/dry hands, Wash/dry face, Oral care, Set up, Bed level Upper Body Bathing: Minimal assitance, Sitting, Bed level Lower Body Bathing: Maximal assistance, Sit to/from stand Upper Body Dressing : Moderate assistance, Sitting Lower Body Dressing: Total assistance, Sit to/from stand Toilet Transfer: Moderate assistance, Stand-pivot, BSC Toileting- Clothing Manipulation and Hygiene: Total assistance Functional mobility during ADLs: Maximal assistance, +2 for physical assistance General ADL Comments: Pt very limited and depressed.  Unable to talk pt into doing much of anything during therapy but she still feels she can go home and not to rehab.  Feel she will be very difficult to care for at home and reviewed with her at length the reasons this therapist feels rehab is necessary.  Cognition: Cognition Overall Cognitive Status: Within Functional  Limits for tasks assessed Orientation Level: Oriented X4 Cognition Arousal/Alertness: Awake/alert Behavior During Therapy: Flat affect Overall Cognitive Status: Within Functional Limits for tasks assessed Area of Impairment: Safety/judgement Safety/Judgement: Decreased awareness of safety, Decreased awareness of deficits General Comments: Pt feels she is okay to go home despite all the assist she is requiring.  Mother would like her to get rehab.  pt stated she would go but really does not want to.  Insight limited.  Physical Exam: Blood pressure 127/74, pulse 97, temperature 98 F (36.7 C), temperature source Oral, resp. rate 18, height _0  (1.6 m), weight 90.81 kg (200 lb 3.2 oz), last menstrual period 01/21/2015, SpO2 100 %. Physical Exam Constitutional: She is oriented to person, place, and time. She appears well-developed and well-nourished.  HENT:  Head: Normocephalic and atraumatic.  Eyes: EOM are normal.  Injected sclera  Neck: Normal range of motion. Neck supple. No thyromegaly present.  Cardiovascular: Normal rate and regular rhythm.  ?  Murmur  Respiratory: Effort normal and breath sounds normal.  GI: Soft. Bowel sounds are normal. She exhibits no distension.  Musculoskeletal: She exhibits no edema or tenderness.  Strength 4/5 throughout  Neurological: She is alert and oriented to person, place, and time.  Mood is flat but appropriate.  Follows simple commands Sensation intact to light touch  Skin: Skin is warm and dry.  Psychiatric: She has a normal mood and affect. Her behavior is normal   Results for orders placed or performed during the hospital encounter of 01/25/15 (from the past 48 hour(s))  Renal function panel     Status: Abnormal   Collection Time: 02/03/15  3:02 AM  Result Value Ref Range   Sodium 137 135 - 145 mmol/L   Potassium 3.9 3.5 - 5.1 mmol/L   Chloride 100 (L) 101 - 111 mmol/L   CO2 30 22 - 32 mmol/L   Glucose, Bld 79 65 - 99 mg/dL   BUN 9 6  - 20 mg/dL   Creatinine, Ser 4.19 (H) 0.44 - 1.00 mg/dL   Calcium 8.3 (L) 8.9 - 10.3 mg/dL   Phosphorus 3.3 2.5 - 4.6 mg/dL   Albumin 1.4 (L) 3.5 - 5.0 g/dL   GFR calc non Af Amer 12 (L) >60 mL/min   GFR calc Af Amer 14 (L) >60 mL/min    Comment: (NOTE) The eGFR has been calculated using the CKD EPI equation. This calculation has not been validated in all clinical situations. eGFR's persistently <60 mL/min signify possible Chronic Kidney Disease.    Anion gap 7 5 - 15  Renal function panel     Status: Abnormal   Collection Time: 02/04/15  4:15 AM  Result Value Ref Range   Sodium 136 135 - 145 mmol/L   Potassium 3.5 3.5 - 5.1 mmol/L   Chloride 102 101 - 111 mmol/L   CO2 28 22 - 32 mmol/L   Glucose, Bld 75 65 - 99 mg/dL   BUN 14 6 - 20 mg/dL   Creatinine, Ser 5.74 (H) 0.44 - 1.00 mg/dL   Calcium 8.3 (L) 8.9 - 10.3 mg/dL   Phosphorus 4.4 2.5 - 4.6 mg/dL   Albumin 1.5 (L) 3.5 - 5.0 g/dL   GFR calc non Af Amer 8 (L) >60 mL/min   GFR calc Af Amer 10 (L) >60 mL/min    Comment: (NOTE) The eGFR has been calculated using the CKD EPI equation. This calculation has not been validated in all clinical situations. eGFR's persistently <60 mL/min signify possible Chronic Kidney Disease.    Anion gap 6 5 - 15   No results found.     Medical Problem List and Plan: 1. Functional deficits secondary to right basal ganglia infarct and debility from recent bacteremia. 2.  DVT Prophylaxis/Anticoagulation: Chronic Coumadin on hold until antibiotics completed 02/09/2015 reassess 3. Pain Management: Oxycodone and Flexeril as needed. Monitor with increased mobility 4. ID/Pseudomonas sepsis. Continue Tressie Ellis 02/09/2015 5. Neuropsych: This patient is capable of making decisions on her own behalf. 6. Skin/Wound Care: Routine skin checks 7. Fluids/Electrolytes/Nutrition: Routine high nose with follow-up chemistries 8. End-stage renal disease/lupus nephritis. Continue hemodialysis as per renal  services 9. Orthostatic hypotension.Midodrine 10 mg twice a day 10. Chronic anemia. Continue Aranesp 11. Hyperlipidemia. Lipitor  Post Admission Physician Evaluation: 1. Functional deficits secondary  to right basal ganglia infarct and debility from recent bacteremia. 2. Patient is admitted to receive collaborative, interdisciplinary care between the physiatrist, rehab nursing staff, and therapy team. 3. Patient's level of  medical complexity and substantial therapy needs in context of that medical necessity cannot be provided at a lesser intensity of care such as a SNF. 4. Patient has experienced substantial functional loss from his/her baseline which was documented above under the "Functional History" and "Functional Status" headings.  Judging by the patient's diagnosis, physical exam, and functional history, the patient has potential for functional progress which will result in measurable gains while on inpatient rehab.  These gains will be of substantial and practical use upon discharge  in facilitating mobility and self-care at the household level. 5. Physiatrist will provide 24 hour management of medical needs as well as oversight of the therapy plan/treatment and provide guidance as appropriate regarding the interaction of the two. 6. 24 hour rehab nursing will assist with bowel management, safety, disease management, medication administration and patient education and help integrate therapy concepts, techniques,education, etc. 7. PT will assess and treat for/with: Lower extremity strength, range of motion, stamina, balance, functional mobility, safety, adaptive techniques and equipment, woundcare, coping skills, pain control, stroke education.   Goals are: Mod I/Supervison. 8. OT will assess and treat for/with: ADL's, functional mobility, safety, upper extremity strength, adaptive techniques and equipment, wound mgt, ego support, and community reintegration.   Goals are: Mod I/Supervision.  Therapy may proceed with showering this patient. 9. Case Management and Social Worker will assess and treat for psychological issues and discharge planning. 10. Team conference will be held weekly to assess progress toward goals and to determine barriers to discharge. 11. Patient will receive at least 3 hours of therapy per day at least 5 days per week. 12. ELOS: 16-19 days.       13. Prognosis:  good   Delice Lesch, MD 02/04/2015

## 2015-02-04 NOTE — Progress Notes (Signed)
Rehab admissions - I met with patient at her bedside.  She is willing to return to acute inpatient rehab.  Bed available and will admit to acute inpatient rehab today.  Call me for questions.  #[phone]

## 2015-02-04 NOTE — Consult Note (Signed)
Physical Medicine and Rehabilitation Consult Reason for Consult: Right basal ganglia infarct Referring Physician: Dr. Johnnye Sima   HPI: [name] is a 39 y.o. 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 Received inpatient rehabilitation services 01/07/2015 to 01/17/2015 after right MCA infarct. She was discharged to home ambulating modified independent. Patient lives with her mother and 2 children ages 46 and 30 and used a walker prior to admission. She does not have 24/7 support on discharge. One level home. Presented 01/25/2015 with low-grade fevers and placed on broad-spectrum antibiotics. Culture showed pansensitive Pseudomonas catheter line bloodstream infection. Hospital course bouts of hypotension. Neurology consulted 01/25/2015 with left leg weakness. MRI of the brain showed medial expansion of anterior right MCA territory infarct and acute right basal ganglia infarct. Patient had been on Coumadin which was recently stopped. Coumadin currently on hold with recent TEE showing no evidence of vegetations and reassess after antibiotics completed.. Tolerating a regular consistency diet. Acute on chronic anemia 8.9-10.5 and monitored. Currently maintained on IV Fortaz through 02/09/2015. Physical therapy evaluation completed and ongoing. Patient needing ongoing encouragement to participate. Request made for physical medicine rehabilitation consult.  Review of Systems  Constitutional: Negative for fever and chills.  HENT: Negative for hearing loss.   Eyes: Negative for blurred vision and double vision.  Respiratory: Negative for cough.        Shortness of breath with exertion  Gastrointestinal: Positive for constipation. Negative for nausea and vomiting.       GERD  Musculoskeletal: Positive for myalgias and joint pain.  Skin: Negative  for rash.  Neurological: Positive for weakness and headaches. Negative for seizures.  Psychiatric/Behavioral:       Anxiety  All other systems reviewed and are negative.  Past Medical History  Diagnosis Date  . Hypertension   . Cardiac arrhythmia   . SVT (supraventricular tachycardia) ([place])     "today, last week, 2 wk ago; maybe 1 month ago, etc; started w/in last 3-4 yrs"(07/20/2012)  . Fainting     "~ 1 month ago; probably related to SVT" (07/20/2012)  . Shortness of breath     "related to SVT episodes" (07/20/2012)  . Chest pain at rest     "related to SVT" (07/20/2012)  . Lupus (systemic lupus erythematosus) ([place])   . GERD (gastroesophageal reflux disease)   . Fibromyalgia   . Irritable bowel syndrome (IBS)   . Hypercholesterolemia   . Heart murmur     "small" (12/25/2014)  . TIA (transient ischemic attack) 12/21/2014  . Sleep apnea     "don't wear mask anymore" (12/25/2014)  . Anemia   . History of blood transfusion "several"    "related to low counts"  . Daily headache   . Arthritis     "hips" (12/25/2014)  . Anxiety     "sometimes; don't take anything for it" (12/25/2014)  . 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  Repeat biopsy early 2016 membranous w/o activity so meds weaned off. Big flare Aug '16 creat 6, 3rd biopsy DPGN, meds resumed. Ended up starting HD Sept 2016  . Stroke ([place])   . History of vascular access device     2016: Sept 9  R IJ cath per IR.  Sept 20 not candidate for fistula due  to disease veins, had LUA hybrid graft placed. Sept 21 - steal syndrome, LUA AVG ligated. Oct 7 - new left femoral Gore-tex loop graft per [facility]. Oct 29 - removal R IJ tunneled HD cath   Past Surgical History  Procedure Laterality Date  . Cesarean section  2001; 2010  . Cervical biopsy  2013  . Supraventricular tachycardia ablation  07/21/12     Dr. Cristopher Peru   . Peripheral  vascular catheterization N/A 12/14/2014    Procedure: Upper Extremity Venography;  Surgeon: Elam Dutch, MD;  Location: [facility];  Service: Cardiovascular;  Laterality: N/A;  . Insertion hybrid anteriovenous gortex graft Left 12/18/2014    Procedure: INSERTION GORE HYBRID ARTERIOVENOUS GRAFT LEFT AXILLO-BRACHIAL.;  Surgeon: Serafina Mitchell, MD;  Location: [facility] OR;  Service: Vascular;  Laterality: Left;  . Ligation of arteriovenous  fistula Left 12/19/2014    Procedure: LIGATION OF FISTULA;  Surgeon: Elam Dutch, MD;  Location: [facility];  Service: Vascular;  Laterality: Left;  . Appendectomy  2011  . Tubal ligation  2010  . Tee without cardioversion N/A 12/25/2014    Procedure: TRANSESOPHAGEAL ECHOCARDIOGRAM (TEE);  Surgeon: Sueanne Margarita, MD;  Location: [facility];  Service: Cardiovascular;  Laterality: N/A;  . Av fistula placement Left 01/04/2015    Procedure: INSERTION OF ARTERIOVENOUS (AV) GORE-TEX GRAFT THIGH;  Surgeon: Rosetta Posner, MD;  Location: [facility];  Service: Vascular;  Laterality: Left;  . Tee without cardioversion N/A 01/30/2015    Procedure: TRANSESOPHAGEAL ECHOCARDIOGRAM (TEE);  Surgeon: Lelon Perla, MD;  Location: [facility] ENDOSCOPY;  Service: Cardiovascular;  Laterality: N/A;   Family History  Problem Relation Age of Onset  . Cancer Mother     breast and ovarian  . BRCA 1/2 Sister    Social History:  reports that she has never smoked. She has never used smokeless tobacco. She reports that she does not drink alcohol or use illicit drugs. Allergies:  Allergies  Allergen Reactions  . Food Swelling    Red peppers   Medications Prior to Admission  Medication Sig Dispense Refill  . atorvastatin (LIPITOR) 20 MG tablet Take 1 tablet (20 mg total) by mouth daily at 6 PM. 30 tablet 2  . Calcium Carbonate Antacid (TUMS PO) Take 1-2 tablets by mouth daily as needed (heartburn).    . cyclobenzaprine (FLEXERIL) 10 MG tablet Take 1 tablet (10 mg total) by mouth at bedtime  as needed for muscle spasms. 30 tablet 0  . hydroxychloroquine (PLAQUENIL) 200 MG tablet Take 2 tablets (400 mg total) by mouth daily. 60 tablet 1  . isosorbide mononitrate (IMDUR) 30 MG 24 hr tablet Take 1 tablet (30 mg total) by mouth daily. 30 tablet 1  . metoprolol succinate (TOPROL-XL) 25 MG 24 hr tablet Take 1 tablet (25 mg total) by mouth at bedtime. 30 tablet 1  . multivitamin (RENA-VIT) TABS tablet Take 1 tablet by mouth at bedtime. 30 tablet 0  . oxyCODONE-acetaminophen (PERCOCET) 5-325 MG tablet Take 1 tablet by mouth every 6 (six) hours as needed for severe pain. 90 tablet 0  . pantoprazole (PROTONIX) 20 MG tablet Take 1 tablet (20 mg total) by mouth daily. 30 tablet 1  . vancomycin (VANCOCIN) 1 GM/200ML SOLN Inject 200 mLs (1,000 mg total) into the vein Every Tuesday,Thursday,and Saturday with dialysis. 4000 mL     Home: Home Living Family/patient expects to be discharged to:: Private residence Living Arrangements: Children, Parent (6 and 34 y.o children ) Available Help  at Discharge: Family, Available 24 hours/day Type of Home: Apartment Home Access: Level entry, Ramped entrance Home Layout: One level Bathroom Shower/Tub: Tub/shower unit, Architectural technologist: Standard Bathroom Accessibility: Yes Home Equipment: Bedside commode, Walker - standard, Environmental consultant - 2 wheels  Lives With:  (lives with MOm stays at home and 2 children (6 and 15))  Functional History: Prior Function Level of Independence: Needs assistance Gait / Transfers Assistance Needed: mod I with RW ADL's / Homemaking Assistance Needed: Requires assist with IADL Comments: prior to admission 10/16, pt was fully independent  Functional Status:  Mobility: Bed Mobility Overal bed mobility: Needs Assistance Bed Mobility: Rolling, Supine to Sit Rolling: Min assist Supine to sit: Min assist, HOB elevated Sit to supine: Max assist, +2 for physical assistance General bed mobility comments: Min assist to support  LLE out of bed. VC for technique and assist for patient to pull through PTs hand to achieve EOB sitting. Able to scoot self to EOB. HOB was elevated Transfers Overall transfer level: Needs assistance Equipment used: Rolling walker (2 wheeled) Transfers: Sit to/from Stand Sit to Stand: Min assist Stand pivot transfers: Min assist General transfer comment: Min assist for boost to stand. [place] for [employer]. VC for hand placement. Pt felt lightheaded upon standing and required to sit. Attempted second time long enough for BP (see vitals tab for orthostatics). HR elevated to 145 with each attempt. Third trial patient able to stand and pivot to chair with min assist for walker control and cues for safety. HR decreases upon sitting each time. Ambulation/Gait General Gait Details: Not performed due to elevated HR and feeling lightheaded    ADL: ADL Overall ADL's : Needs assistance/impaired Eating/Feeding: Set up, Sitting, Bed level Grooming: Wash/dry hands, Wash/dry face, Oral care, Set up, Bed level Upper Body Bathing: Minimal assitance, Sitting, Bed level Lower Body Bathing: Maximal assistance, Sit to/from stand Upper Body Dressing : Moderate assistance, Sitting Lower Body Dressing: Total assistance, Sit to/from stand Toilet Transfer: Moderate assistance, Stand-pivot, BSC Toileting- Clothing Manipulation and Hygiene: Total assistance Functional mobility during ADLs: Maximal assistance, +2 for physical assistance General ADL Comments: Pt very limited and depressed.  Unable to talk pt into doing much of anything during therapy but she still feels she can go home and not to rehab.  Feel she will be very difficult to care for at home and reviewed with her at length the reasons this therapist feels rehab is necessary.  Cognition: Cognition Overall Cognitive Status: Within Functional Limits for tasks assessed Orientation Level: Oriented X4 Cognition Arousal/Alertness: Awake/alert Behavior During  Therapy: Flat affect Overall Cognitive Status: Within Functional Limits for tasks assessed Area of Impairment: Safety/judgement Safety/Judgement: Decreased awareness of safety, Decreased awareness of deficits General Comments: Pt feels she is okay to go home despite all the assist she is requiring.  Mother would like her to get rehab.  pt stated she would go but really does not want to.  Insight limited.  Blood pressure 127/74, pulse 97, temperature 98 F (36.7 C), temperature source Oral, resp. rate 18, height 5' 3"  (1.6 m), weight 90.81 kg (200 lb 3.2 oz), last menstrual period 01/21/2015, SpO2 100 %. Physical Exam  Vitals reviewed. Constitutional: She is oriented to person, place, and time. She appears well-developed and well-nourished.  HENT:  Head: Normocephalic and atraumatic.  Eyes: EOM are normal.  Injected sclera  Neck: Normal range of motion. Neck supple. No thyromegaly present.  Cardiovascular: Normal rate and regular rhythm.   ?Murmur  Respiratory: Effort normal  and breath sounds normal.  GI: Soft. Bowel sounds are normal. She exhibits no distension.  Musculoskeletal: She exhibits no edema or tenderness.  Strength 4/5 throughout  Neurological: She is alert and oriented to person, place, and time.  Mood is flat but appropriate.  Follows simple commands Sensation intact to light touch  Skin: Skin is warm and dry.  Psychiatric: She has a normal mood and affect. Her behavior is normal.    Results for orders placed or performed during the hospital encounter of 01/25/15 (from the past 24 hour(s))  Renal function panel     Status: Abnormal   Collection Time: 02/04/15  4:15 AM  Result Value Ref Range   Sodium 136 135 - 145 mmol/L   Potassium 3.5 3.5 - 5.1 mmol/L   Chloride 102 101 - 111 mmol/L   CO2 28 22 - 32 mmol/L   Glucose, Bld 75 65 - 99 mg/dL   BUN 14 6 - 20 mg/dL   Creatinine, Ser 5.74 (H) 0.44 - 1.00 mg/dL   Calcium 8.3 (L) 8.9 - 10.3 mg/dL   Phosphorus 4.4 2.5 -  4.6 mg/dL   Albumin 1.5 (L) 3.5 - 5.0 g/dL   GFR calc non Af Amer 8 (L) >60 mL/min   GFR calc Af Amer 10 (L) >60 mL/min   Anion gap 6 5 - 15   No results found.  Assessment/Plan: Diagnosis: Debilitation/sepsis Labs and images independently reviewed.  Records reviewed and summated above. Stroke:  Continue secondary stroke prophylaxis and Risk Factor Modification listed below:   Antiplatelet therapy: Await final recs for restarting Statin Agent:    1. Does the need for close, 24 hr/day medical supervision in concert with the patient's rehab needs make it unreasonable for this patient to be served in a less intensive setting? Yes 2. 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 Due to bowel management, safety, disease management, medication administration and patient education, does the patient require 24 hr/day rehab nursing? Yes 4. Does the patient require coordinated care of a physician, rehab nurse, PT (1.5-2 hrs/day, 5 days/week) and OT (1.5-2 hrs/day, 5 days/week) to address physical and functional deficits in the context of the above medical diagnosis(es)? Yes Addressing deficits in the following areas: balance, endurance, locomotion, strength, transferring, bathing, dressing, grooming, toileting and psychosocial support 5. Can the patient actively participate in an intensive therapy program of at least 3 hrs of therapy per day at least 5 days per week? Potentially 6. The potential for patient to make measurable gains while on inpatient rehab is good 7. Anticipated functional outcomes upon discharge from inpatient rehab are modified independent and supervision  with PT, modified independent and supervision with OT, n/a  with SLP. 8. Estimated rehab length of stay to reach the above functional goals is: 16-19 days. 9. Does the patient have adequate social supports and living environment to accommodate these discharge functional goals? Potentially 10. Anticipated D/C setting: Home 11. Anticipated post D/C treatments: HH therapy and Home excercise program 12. Overall Rehab/Functional Prognosis: good  RECOMMENDATIONS: [name] condition is appropriate for continued rehabilitative care in the following setting: CIR Patient has agreed to participate in recommended program. Yes Note that insurance prior authorization may be required for reimbursement for recommended care.  Comment: Rehab Admissions Coordinator to follow up.  Delice Lesch, MD 02/04/2015

## 2015-02-04 NOTE — Progress Notes (Signed)
Patient ID: [name], female   DOB: July 11, 1975, 39 y.o.   MRN: [id number] Patient arrived from [place] with RN and belongings. Oriented to room, rehab schedule, fall prevention plan, rehab safety plan, and health resource notebook with verbal understanding. Patient declines Flu vaccination.Patient has no complaints of pain at this time. Will continue to monitor.

## 2015-02-04 NOTE — Care Management Note (Signed)
Case Management Note  Patient Details  Name: [name] MRN: [id number] Date of Birth: 05-15-1975  Subjective/Objective:           CM following for progression and d/c planning.         Action/Plan: 02/04/2015 Pt has been accepting by [facility] and will d/c to that facility today.   Expected Discharge Date:     02/04/2015             Expected Discharge Plan:  [place]  In-House Referral:  NA  Discharge planning Services  CM Consult  Post Acute Care Choice:  NA Choice offered to:  NA  DME Arranged:  N/A DME Agency:  NA  HH Arranged:   NA HH Agency:   NA  Status of Service:  Completed, signed off  Medicare Important Message Given:    Date Medicare IM Given:    Medicare IM give by:    Date Additional Medicare IM Given:    Additional Medicare Important Message give by:     If discussed at [place] of Stay Meetings, dates discussed:    Additional Comments:  Adron Bene, RN 02/04/2015, 4:11 PM

## 2015-02-04 NOTE — Progress Notes (Signed)
Chaplain stopped by to see Pt. Pt. asked Chaplain to come back later.

## 2015-02-04 NOTE — Care Management (Signed)
Noted orders for HHPT and [place], as recorded yesterday 02/03/2015 this pt insurance will not cover HHPT and/or [place]. Noted discussion with MD re need for CIR consult, however no order noted. Please order CIR consult, so that a plan can be made for this pt.    Jasmine Pang RN MPH, case manager, [phone]

## 2015-02-04 NOTE — Progress Notes (Signed)
Retta Diones, RN Rehab Admission Coordinator Signed Physical Medicine and Rehabilitation PMR Pre-admission 02/04/2015 3:35 PM  Related encounter: ED to Hosp-Admission (Discharged) from 01/25/2015 in [facility] Collapse All   PMR Admission Coordinator Pre-Admission Assessment  Patient: [name] is an 39 y.o., female MRN: [id number] DOB: [birth year] Height: 5' 3"  (160 cm) Weight: 90.81 kg (200 lb 3.2 oz)  Insurance Information HMO: PPO: PCP: IPA: 80/20: OTHER:  PRIMARY: Medicaid [place] access Policy#: [id number] T Subscriber: Bettye Boeck CM Name: Phone#: Fax#:  Pre-Cert#: Employer: FT until 11/26/14 Benefits: Phone #: [phone] Name: Automated Eff. Date: Eligible 02/04/15 Deduct: Out of Pocket Max: Life Max:  CIR: SNF:  Outpatient: Co-Pay:  Home Health: Co-Pay:  DME: Co-Pay:  Providers:   Medicaid Application Date: Case Manager:  Disability Application Date: Case Worker:   Emergency Contact Information Contact Information    Name Relation Home Work Mobile   Davis,Nichole Sister   [phone]   Hilton,Denise Mother [phone]  [phone]     Current Medical History  Patient Admitting Diagnosis: Debility/sepsis  History of Present Illness: A 39 y.o. 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 Received inpatient rehabilitation services 01/07/2015 to 01/17/2015 after right MCA infarct. She was discharged to home ambulating modified independent. Patient lives with her  mother and 2 children ages 39 and 31 and used a walker prior to admission. She does not have 24/7 support on discharge. One level home. Presented 01/25/2015 with low-grade fevers and placed on broad-spectrum antibiotics. Culture showed pansensitive Pseudomonas catheter line bloodstream infection. Hospital course bouts of hypotension. Neurology consulted 01/25/2015 with left leg weakness. MRI of the brain showed medial expansion of anterior right MCA territory infarct and acute right basal ganglia infarct. Patient had been on Coumadin which was recently stopped. Coumadin currently on hold with recent TEE showing no evidence of vegetations and reassess after antibiotics completed.. Tolerating a regular consistency diet. Acute on chronic anemia 8.9-10.5 and monitored. Currently maintained on IV Fortaz through 02/09/2015. Physical therapy evaluation completed and ongoing. Patient needing ongoing encouragement to participate. Request made for physical medicine rehabilitation consult.   Total: 5=NIH  Past Medical History  Past Medical History  Diagnosis Date  . Hypertension   . Cardiac arrhythmia   . SVT (supraventricular tachycardia) ([place])     "today, last week, 2 wk ago; maybe 1 month ago, etc; started w/in last 3-4 yrs"(07/20/2012)  . Fainting     "~ 1 month ago; probably related to SVT" (07/20/2012)  . Shortness of breath     "related to SVT episodes" (07/20/2012)  . Chest pain at rest     "related to SVT" (07/20/2012)  . Lupus (systemic lupus erythematosus) ([place])   . GERD (gastroesophageal reflux disease)   . Fibromyalgia   . Irritable bowel syndrome (IBS)   . Hypercholesterolemia   . Heart murmur     "small" (12/25/2014)  . TIA (transient ischemic attack) 12/21/2014  . Sleep apnea     "don't wear mask anymore" (12/25/2014)  . Anemia   . History of blood transfusion "several"    "related to low counts"  . Daily headache    . Arthritis     "hips" (12/25/2014)  . Anxiety     "sometimes; don't take anything for it" (12/25/2014)  . 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 Repeat biopsy early 2016 membranous w/o activity so meds weaned off. Big flare Aug '16 creat 6, 3rd biopsy DPGN, meds resumed. Ended up starting HD Sept 2016  . Stroke ([place])   . History of vascular access device     2016: Sept 9 R IJ cath per IR. Sept 20 not candidate for fistula due to disease veins, had LUA hybrid graft placed. Sept 21 - steal syndrome, LUA AVG ligated. Oct 7 - new left femoral Gore-tex loop graft per [facility]. Oct 29 - removal R IJ tunneled HD cath    Family History  family history includes BRCA 1/2 in her sister; Cancer in her mother.  Prior Rehab/Hospitalizations: Recent CIR admission 10/10 to 01/17/15. Was set up to have [facility] follow up at home. Became ill and had to return to the hospital.  Has the patient had major surgery during 100 days prior to admission? Yes. A tunnelled graft was placed in her leg for HD.  Current Medications   Current facility-administered medications:  . 0.9 % sodium chloride infusion, 250 mL, Intravenous, PRN, [place] Nickel, MD . 0.9 % sodium chloride infusion, , Intravenous, Once, Alric Seton, PA-C . acetaminophen (TYLENOL) tablet 650 mg, 650 mg, Oral, Q6H PRN, 650 mg at 01/26/15 1711 **OR** acetaminophen (TYLENOL) suppository 650 mg, 650 mg, Rectal, Q6H PRN, Milagros Loll, MD . atorvastatin (LIPITOR) tablet 20 mg, 20 mg, Oral, q1800, Milagros Loll, MD, 20 mg at 02/03/15 1701 . cefTAZidime (FORTAZ) 2 g in dextrose 5 % 50 mL IVPB, 2 g, Intravenous, Q T,Th,Sa-HD, Otilio Miu, RPH, 2 g at 02/02/15 1011 . cyclobenzaprine (FLEXERIL) tablet 10 mg, 10 mg, Oral, QHS PRN, [place] Nickel, MD . Darbepoetin Alfa (ARANESP) injection 200 mcg, 200 mcg,  Intravenous, Q Thu-HD, Alric Seton, PA-C . feeding supplement (PRO-STAT SUGAR FREE 64) liquid 30 mL, 30 mL, Oral, BID, Alric Seton, PA-C, 30 mL at 02/04/15 1019 . hydroxychloroquine (PLAQUENIL) tablet 400 mg, 400 mg, Oral, Daily, Milagros Loll, MD, 400 mg at 02/04/15 1019 . loperamide (IMODIUM) capsule 2 mg, 2 mg, Oral, Q2H PRN, Collene Gobble, MD, 2 mg at 01/27/15 2242 . metoprolol succinate (TOPROL-XL) 24 hr tablet 25 mg, 25 mg, Oral, QHS, Milagros Loll, MD, 25 mg at 02/03/15 2136 . midodrine (PROAMATINE) tablet 10 mg, 10 mg, Oral, BID WC, Roney Jaffe, MD, 10 mg at 02/04/15 0742 . multivitamin (RENA-VIT) tablet 1 tablet, 1 tablet, Oral, QHS, Milagros Loll, MD, 1 tablet at 02/03/15 2136 . ondansetron (ZOFRAN) tablet 4 mg, 4 mg, Oral, Q6H PRN **OR** ondansetron (ZOFRAN) injection 4 mg, 4 mg, Intravenous, Q6H PRN, Milagros Loll, MD, 4 mg at 01/29/15 1619 . oxyCODONE-acetaminophen (PERCOCET/ROXICET) 5-325 MG per tablet 1 tablet, 1 tablet, Oral, Q6H PRN, Milagros Loll, MD, 1 tablet at 02/01/15 1856 . pantoprazole (PROTONIX) EC tablet 40 mg, 40 mg, Oral, Daily, Marjan Rabbani, MD, 40 mg at 02/04/15 1019 . polyethylene glycol (MIRALAX / GLYCOLAX) packet 17 g, 17 g, Oral, Daily PRN, Milagros Loll, MD . sodium chloride 0.9 % injection 3 mL, 3 mL, Intravenous, Q12H, Milagros Loll, MD, 3 mL at 02/04/15 1020 . trimethobenzamide (TIGAN) capsule 300 mg, 300 mg, Oral, Q8H PRN, Collene Gobble, MD, 300 mg at 01/27/15 2242  Patients Current Diet: Diet regular Room service appropriate?: Yes; Fluid consistency:: Thin; Fluid restriction:: 1200 mL Fluid Diet - low sodium heart healthy  Precautions / Restrictions  Precautions Precautions: Fall Precaution Comments: Pt's femoral cath is tunneled therefore she is able to get OOB. Restrictions Weight Bearing Restrictions: No   Has the patient had 2 or more falls or a fall with injury in the past year?No. Slid off the bed X 1  with no injury.  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   Home Assistive Devices / Equipment Home Assistive Devices/Equipment: Wheelchair Home Equipment: Bedside commode, Walker - standard, Environmental consultant - 2 wheels  Prior Device Use: Indicate devices/aids used by the patient prior to current illness, exacerbation or injury? Walker  Prior Functional Level Prior Function Level of Independence: Needs assistance Gait / Transfers Assistance Needed: mod I with RW ADL's / Homemaking Assistance Needed: Requires assist with IADL Comments: prior to admission 10/16, pt was fully independent   Self Care: Did the patient need help bathing, dressing, using the toilet or eating? Independent  Indoor Mobility: Did the patient need assistance with walking from room to room (with or without device)? Independent  Stairs: Did the patient need assistance with internal or external stairs (with or without device)? Needed some help  Functional Cognition: Did the patient need help planning regular tasks such as shopping or remembering to take medications? Needed some help  Current Functional Level Cognition  Overall Cognitive Status: Within Functional Limits for tasks assessed Orientation Level: Oriented X4 Safety/Judgement: Decreased awareness of safety, Decreased awareness of deficits General Comments: Pt feels she is okay to go home despite all the assist she is requiring. Mother would like her to get rehab. pt stated she would go but really does not want to. Insight limited.   Extremity Assessment (includes Sensation/Coordination)  Upper Extremity Assessment: Defer to OT evaluation LUE Deficits / Details: AROM shoulder flexion 0-110 degrees, all other joints AROM WFLS. Strength 3+/5 in shoulder, elbow, and grip  Lower Extremity Assessment: RLE deficits/detail, LLE deficits/detail RLE Deficits / Details: grossly 3+/5 LLE  Deficits / Details: grossly 3/5    ADLs  Overall ADL's : Needs assistance/impaired Eating/Feeding: Set up, Sitting, Bed level Grooming: Wash/dry hands, Wash/dry face, Oral care, Set up, Bed level Upper Body Bathing: Minimal assitance, Sitting, Bed level Lower Body Bathing: Maximal assistance, Sit to/from stand Upper Body Dressing : Moderate assistance, Sitting Lower Body Dressing: Total assistance, Sit to/from stand Toilet Transfer: Moderate assistance, Stand-pivot, BSC Toileting- Clothing Manipulation and Hygiene: Total assistance Functional mobility during ADLs: Maximal assistance, +2 for physical assistance General ADL Comments: Pt very limited and depressed. Unable to talk pt into doing much of anything during therapy but she still feels she can go home and not to rehab. Feel she will be very difficult to care for at home and reviewed with her at length the reasons this therapist feels rehab is necessary.    Mobility  Overal bed mobility: Needs Assistance Bed Mobility: Rolling, Supine to Sit Rolling: Min assist Supine to sit: Min assist, HOB elevated Sit to supine: Max assist, +2 for physical assistance General bed mobility comments: Min assist to support LLE out of bed. VC for technique and assist for patient to pull through PTs hand to achieve EOB sitting. Able to scoot self to EOB. HOB was elevated    Transfers  Overall transfer level: Needs assistance Equipment used: Rolling walker (2 wheeled) Transfers: Sit to/from Stand Sit to Stand: Min assist Stand pivot transfers: Min assist General transfer comment: Min assist for boost to stand. [place] for [employer]. VC for hand placement.  Pt felt lightheaded upon standing and required to sit. Attempted second time long enough for BP (see vitals tab for orthostatics). HR elevated to 145 with each attempt. Third trial patient able to stand and pivot to chair with min assist for walker control and cues for safety. HR decreases upon  sitting each time.    Ambulation / Gait / Stairs / Wheelchair Mobility  Ambulation/Gait General Gait Details: Not performed due to elevated HR and feeling lightheaded    Posture / Balance Dynamic Sitting Balance Sitting balance - Comments: Pt leans from posterior to the L and needed mod assist to recover. Balance Overall balance assessment: Needs assistance Sitting-balance support: Bilateral upper extremity supported, Feet supported Sitting balance-Leahy Scale: Poor Sitting balance - Comments: Pt leans from posterior to the L and needed mod assist to recover. Postural control: Posterior lean, Left lateral lean Standing balance support: Bilateral upper extremity supported, During functional activity Standing balance-Leahy Scale: Poor Standing balance comment: pt refused to stand despite encouragement.    Special needs/care consideration BiPAP/CPAP No CPM No Continuous Drip IV No Dialysis Yes Days T-TH-Sat Life Vest No Oxygen No Special Bed No Trach Size No Wound Vac (area) No  Skin No  Bowel mgmt: Patient states last BM today, 02/04/15. Documentation last BM was 02/03/15 Bladder mgmt: Voids 1 X a day Diabetic mgmt No    Previous Home Environment Living Arrangements: Children, Parent (6 and 56 y.o children ) Lives With: (lives with MOm stays at home and 2 children (6 and 15)) Available Help at Discharge: Family, Available 24 hours/day Type of Home: Apartment Home Layout: One level Home Access: Level entry, Ramped entrance Bathroom Shower/Tub: Tub/shower unit, Architectural technologist: Standard Bathroom Accessibility: Yes How Accessible: Accessible via walker, Accessible via wheelchair [place]: Yes Type of [place]: Home RN, [place] (if known): [facility]  Discharge Living Setting Plans for Discharge Living Setting: Lives with (comment), Apartment (Lives with mom and 2  children.) Type of Home at Discharge: Apartment Discharge Home Layout: One level Discharge Home Access: Level entry Discharge Bathroom Shower/Tub: Tub/shower unit Discharge Bathroom Toilet: Standard Discharge Bathroom Accessibility: Yes How Accessible: Accessible via walker Does the patient have any problems obtaining your medications?: No  Social/Family/Support Systems Patient Roles: Parent, Other (Comment) (Has her mother, 41 yo daughter, 21 yo son.) Contact Information: Algis Greenhouse - mother Anticipated Caregiver: mom Anticipated Caregiver's Contact Information: Langley Gauss - mom [phone] Ability/Limitations of Caregiver: Mom does not work and can Veterinary surgeon Availability: 24/7 Discharge Plan Discussed with Primary Caregiver: Yes Is Caregiver In Agreement with Plan?: Yes Does Caregiver/Family have Issues with Lodging/Transportation while Pt is in Rehab?: No  Goals/Additional Needs Patient/Family Goal for Rehab: PT/OT mod I and supervision goals Expected length of stay: 16-19 days Cultural Considerations: [employer] Dietary Needs: Regular, thin liquids diet Equipment Needs: TBD Special Service Needs: Has HD T-Th-Sat and goes to HD on [address]. Pt/Family Agrees to Admission and willing to participate: Yes Program Orientation Provided & Reviewed with Pt/Caregiver Including Roles & Responsibilities: Yes  Decrease burden of Care through IP rehab admission: N/A  Possible need for SNF placement upon discharge: Not planned  Patient Condition: [name] condition remains as documented in the consult dated 02/04/15, in which the Rehabilitation Physician determined and documented that the patient's condition is appropriate for intensive rehabilitative care in an inpatient rehabilitation facility. Will admit to inpatient rehab today.  Preadmission Screen Completed By: Retta Diones, 02/04/2015 3:43 PM ______________________________________________________________________  Discussed status with Dr. Posey Pronto on 02/04/15 at 1543 and received telephone approval for admission today.  Admission Coordinator: Retta Diones, time1543/Date11/7/16          Cosigned by: Ankit Lorie Phenix, MD at 02/04/2015 3:45 PM  Revision History     Date/Time User Provider Type Action   02/04/2015 3:45 PM Ankit Lorie Phenix, MD Physician Cosign   02/04/2015 3:44 PM Retta Diones, RN Rehab Admission Coordinator Sign

## 2015-02-04 NOTE — Interval H&P Note (Signed)
[name] was admitted today to Inpatient Rehabilitation with the diagnosis of right basal ganglia infarct and debility from recent bacteremia.  The patient's history has been reviewed, patient examined, and there is no change in status.  Patient continues to be appropriate for intensive inpatient rehabilitation.  I have reviewed the patient's chart and labs.  Questions were answered to the patient's satisfaction. The PAPE has been reviewed and assessment remains appropriate.  Ankit Lorie Phenix 02/04/2015, 7:41 PM

## 2015-02-04 NOTE — Discharge Summary (Signed)
Name: [name] MRN: [id number] DOB: 11/23/1975 39 y.o. PCP: Lin Landsman, MD  Date of Admission: 01/25/2015  3:20 AM Date of Discharge: 02/04/2015 Attending Physician: Campbell Riches, MD  Discharge Diagnosis: Active Problems:   Endocarditis of mitral valve   Right middle cerebral artery stroke (HCC)   Fever, unspecified   Sepsis ([place])   Pseudomonas sepsis ([place])   End-stage renal disease on hemodialysis ([place])   History of vascular access device  Discharge Medications:   Medication List    STOP taking these medications        isosorbide mononitrate 30 MG 24 hr tablet  Commonly known as:  IMDUR     vancomycin 1 GM/200ML Soln  Commonly known as:  VANCOCIN      TAKE these medications        atorvastatin 20 MG tablet  Commonly known as:  LIPITOR  Take 1 tablet (20 mg total) by mouth daily at 6 PM.     cefTAZidime 2 g in dextrose 5 % 50 mL  Inject 2 g into the vein Every Tuesday,Thursday,and Saturday with dialysis.     cyclobenzaprine 10 MG tablet  Commonly known as:  FLEXERIL  Take 1 tablet (10 mg total) by mouth at bedtime as needed for muscle spasms.     Darbepoetin Alfa 200 MCG/0.4ML Sosy injection  Commonly known as:  ARANESP  Inject 0.4 mLs (200 mcg total) into the vein every Thursday with hemodialysis.     hydroxychloroquine 200 MG tablet  Commonly known as:  PLAQUENIL  Take 2 tablets (400 mg total) by mouth daily.     metoprolol succinate 25 MG 24 hr tablet  Commonly known as:  TOPROL-XL  Take 1 tablet (25 mg total) by mouth at bedtime.     midodrine 10 MG tablet  Commonly known as:  PROAMATINE  Take 1 tablet (10 mg total) by mouth 2 (two) times daily with a meal.     multivitamin Tabs tablet  Take 1 tablet by mouth at bedtime.     oxyCODONE-acetaminophen 5-325 MG tablet  Commonly known as:  PERCOCET  Take 1 tablet by mouth every 6 (six) hours as needed for severe pain.     pantoprazole 20 MG tablet  Commonly known as:  PROTONIX  Take 1  tablet (20 mg total) by mouth daily.     polyethylene glycol packet  Commonly known as:  MIRALAX / GLYCOLAX  Take 17 g by mouth daily as needed for mild constipation.     trimethobenzamide 300 MG capsule  Commonly known as:  TIGAN  Take 1 capsule (300 mg total) by mouth every 8 (eight) hours as needed for nausea/vomiting.     TUMS PO  Take 1-2 tablets by mouth daily as needed (heartburn).        Disposition and follow-up:   Ms.Kendrick Larrick was discharged from [facility] in Good condition.  At the hospital follow up visit please address:  1. Pseudomonal sepsis: Discharged on ceftazidime with HD until 11/12 to complete treatment for 14 days past removal of her R femoral catheter line  2. R MCA stroke, deconditioning: Severe deconditioning due to prolonged hospital course in September through October, worsened by systemic illness and another week of hospitalization in November. CIR for strengthening and DME ordered for home. Patient was prescribed warfarin originally for her mitral vegetations but on repeat TEE this admission no vegetations are appreciated. Risk versus benefit of anticoagulation should be assessed 2 weeks after treatment  of pseudomonal infection, as this worsens hemorrhagic conversion risk if septic embolization was involved.  3. Adjustment disorder/Depression: Patient expressing concern for her ability to improve back to baseline, frustration with poor progress so far, and lack of ability to pursue any of her usual interests. She has a recent, appropriate cause of her concerns but depression should be discussed in her outpatient follow up.   Follow-up Appointments:     Follow-up Information    Call Kristine Garbe, MD.   Specialty:  Family Medicine   Why:  To arrange a follow up appointment after discharge from [place] information:   [address] [place] [id number] [phone]       Discharge Instructions: Discharge  Instructions    Diet - low sodium heart healthy    Complete by:  As directed            Consultations: Treatment Team:  Jamal Maes, MD  Procedures Performed:  Ct Abdomen Pelvis Wo Contrast  01/15/2015  CLINICAL DATA:  Acute onset of fever, nausea and abdominal discomfort. Initial encounter. EXAM: CT ABDOMEN AND PELVIS WITHOUT CONTRAST TECHNIQUE: Multidetector CT imaging of the abdomen and pelvis was performed following the standard protocol without IV contrast. COMPARISON:  CT of the abdomen and pelvis from 12/03/2014 FINDINGS: A small left pleural effusion is noted, with left basilar airspace opacity likely reflecting atelectasis. The liver and spleen are unremarkable in appearance. The gallbladder is within normal limits. The pancreas and adrenal glands are unremarkable. Nonspecific perinephric stranding is noted bilaterally, with trace associated fluid tracking along Gerota's fascia. No renal or ureteral stones are identified. No free fluid is identified. The small bowel is unremarkable in appearance. The stomach is within normal limits. No acute vascular abnormalities are seen. The patient is status post appendectomy. The colon is unremarkable in appearance. The bladder is decompressed and not well assessed. The uterus is unremarkable in appearance. The ovaries are grossly symmetric. No suspicious adnexal masses are seen. No inguinal lymphadenopathy is seen. Soft tissue edema is noted at both flanks, and diffuse soft tissue inflammation is seen tracking along the proximal left thigh. Postoperative change is noted about the left inguinal region, with underlying vascular calcification. A right femoral line is noted ending at the infrarenal IVC. No acute osseous abnormalities are identified. IMPRESSION: 1. No acute abnormality seen to explain the patient's symptoms. 2. Soft tissue edema at both flanks. Diffuse soft inflammation tracks along the proximal left thigh. Postoperative change at the left  inguinal region. Would correlate for any evidence of venous obstruction or cellulitis. 3. Small left pleural effusion, with left basilar airspace opacity likely reflecting atelectasis. 4. Nonspecific bilateral perinephric stranding is relatively stable and likely reflects the patient's baseline. Electronically Signed   By: Garald Balding M.D.   On: 01/15/2015 03:00   Dg Chest 2 View  01/11/2015  CLINICAL DATA:  Fever. EXAM: CHEST  2 VIEW COMPARISON:  November 13, 2014. FINDINGS: The heart size and mediastinal contours are within normal limits. No pneumothorax or pleural effusion is noted. Mild left basilar subsegmental atelectasis is noted. Right lung is clear. Right internal jugular catheter is noted with distal tip overlying expected position of the SVC. The visualized skeletal structures are unremarkable. IMPRESSION: Mild left basilar subsegmental atelectasis. Electronically Signed   By: Marijo Conception, M.D.   On: 01/11/2015 16:15   Ct Head Wo Contrast  01/25/2015  CLINICAL DATA:  39 year old female with new onset left-sided weakness. Code stroke.  EXAM: CT HEAD WITHOUT CONTRAST TECHNIQUE: Contiguous axial images were obtained from the base of the skull through the vertex without intravenous contrast. COMPARISON:  12/21/2014 CT and MR FINDINGS: Right MCA/frontal infarct identified with similar distribution and size when compared to 12/21/2014. Calcification/clot in adjacent right MCA branch again noted. No new infarct, hemorrhage, extra-axial fluid collection, midline shift or hydrocephalus identified. No acute or suspicious bony abnormalities are identified. IMPRESSION: Evolutionary changes of subacute right MCA/frontal infarct. No definite evidence of extension but consider MR as clinically indicated. No evidence of new hemorrhage or infarct. Critical Value/emergent results were called by telephone at the time of interpretation on 01/25/2015 at 11:16 am to Dr. Doy Mince, who verbally acknowledged these  results. Electronically Signed   By: Margarette Canada M.D.   On: 01/25/2015 11:18   Mr Brain Wo Contrast  01/25/2015  CLINICAL DATA:  Left leg weakness. Fevers. Possible sepsis. Recent infarct. EXAM: MRI HEAD WITHOUT CONTRAST TECHNIQUE: Multiplanar, multiecho pulse sequences of the brain and surrounding structures were obtained without intravenous contrast. COMPARISON:  CT head without contrast 01/25/2015. MRI brain 12/21/2014 FINDINGS: The diffusion-weighted images demonstrate some expansion of the anterior right MCA territory infarct along the medial edge. There is also a new restricted diffusion within the right caudate head and lentiform nucleus. There is no associated hemorrhage. T2 changes have slightly progressed as well. No acute left-sided infarcts are present. There is no significant left-sided white matter disease. Flow is present in the major intracranial arteries. The brainstem and cerebellum are within normal limits. Mucosal disease is evident along the floor of the maxillary sinuses bilaterally and partial opacification of the right sphenoid sinus is again noted. Bilateral mastoid effusions are present. No obstructing at nasopharyngeal lesion is evident. IMPRESSION: 1. Medial to expansion of the anterior right MCA territory infarct. 2. Restricted diffusion involving the right caudate head and lentiform nucleus compatible with acute ischemic changes in the basal ganglia as well. 3. Stable sinus disease. Electronically Signed   By: San Morelle M.D.   On: 01/25/2015 15:51   Ir Removal Tun Cv Cath W/o Fl  01/17/2015  CLINICAL DATA:  Request for right IJ tunneled central catheter removal that is no longer needed, placed 11/29/2014 by IR for poor venous access. EXAM: REMOVAL TUNNELED CENTRAL VENOUS CATHETER PROCEDURE: The patient's right chest and catheter was prepped and draped in a normal sterile fashion. Using gentle manual retraction the cuff of the catheter was exposed and the catheter was  removed in it's entirety. Pressure was held till hemostasis was obtained. A sterile dressing was applied. The patient tolerated the procedure well with no immediate complications. COMPLICATIONS: None immediate. IMPRESSION: Successful catheter removal as described above. Read By:  Tsosie Billing PA-C Electronically Signed   By: Corrie Mckusick D.O.   On: 01/17/2015 14:32   Ir Removal [employer] Access W/ Port W/o Fl Mod Sed  01/29/2015  CLINICAL DATA:  Possible line sepsis, removal requested. EXAM: TUNNELED HEMODIALYSIS CATHETER REMOVAL TECHNIQUE: Maximal barrier sterile technique was utilized . The previously placed hemodialysis catheter was dissected free from the underlying soft tissues and removed intact. Hemostasis was achieved. Site covered with a sterile dressing. The patient tolerated the procedure well. Operator:  Gareth Eagle PA COMPLICATIONS: none IMPRESSION: 1. Technically successful tunneled hemodialysis catheter removal. Electronically Signed   By: Lucrezia Europe M.D.   On: 01/29/2015 08:40   Dg Chest Port 1 View  01/25/2015  CLINICAL DATA:  Acute onset of fever.  Initial encounter. EXAM: PORTABLE CHEST 1  VIEW COMPARISON:  Chest radiograph performed 01/11/2015 FINDINGS: The lungs are hypoexpanded. A small left pleural effusion is noted, with left basilar airspace opacity, which may reflect mild pneumonia. There is no evidence of pneumothorax. The cardiomediastinal silhouette is borderline enlarged. No acute osseous abnormalities are seen. IMPRESSION: Lungs hypoexpanded. Small left pleural effusion, with left basilar airspace opacity, which may reflect mild pneumonia. Borderline cardiomegaly. Electronically Signed   By: Garald Balding M.D.   On: 01/25/2015 04:16    2D Echo: LV EF: 60% -  65% ------------------------------------------------------------------- Indications:   Fever 780.6. ------------------------------------------------------------------- History:  PMH: Sepsis. Weight loss,  unintentional. H/O endocarditis of mitral valve. Left hemiparesis. Stroke. ------------------------------------------------------------------- Study Conclusions  - Left ventricle: The cavity size was normal. Systolic function was normal. The estimated ejection fraction was in the range of 60% to 65%. Wall motion was normal; there were no regional wall motion abnormalities. The transmitral flow pattern was not recorded. The study is not technically sufficient to allow evaluation of LV diastolic function. - Mitral valve: Calcified annulus. - Pulmonary arteries: PA peak pressure: 41 mm Hg (S).  Impressions:  - The right ventricular systolic pressure was increased consistent with moderate pulmonary hypertension.  Transthoracic echocardiography. M-mode, limited 2D, limited spectral Doppler, and color Doppler. Birthdate: Patient birthdate: [birth year]. Age: Patient is 39 yr old. Sex: Gender: female.  BMI: 34.9 kg/m^2. Blood pressure:   100/50 Patient status: Inpatient. Study date: Study date: 01/25/2015. Study time: 12:39 PM. Location: Bedside. ------------------------------------------------------------------- Left ventricle: The cavity size was normal. Systolic function was normal. The estimated ejection fraction was in the range of 60% to 65%. Wall motion was normal; there were no regional wall motion abnormalities. The transmitral flow pattern was not recorded. The study is not technically sufficient to allow evaluation of LV diastolic function. ------------------------------------------------------------------- Aortic valve:  Trileaflet; normal thickness leaflets. Mobility was not restricted. Doppler: Transvalvular velocity was within the normal range. There was no stenosis. There was no regurgitation. ------------------------------------------------------------------- Aorta: Aortic root: The aortic root was normal in  size. ------------------------------------------------------------------- Mitral valve:  Calcified annulus. Mobility was not restricted. Doppler: Transvalvular velocity was within the normal range. There was no evidence for stenosis. There was no regurgitation. ------------------------------------------------------------------- Left atrium: The atrium was normal in size. ------------------------------------------------------------------- Right ventricle: The cavity size was normal. Wall thickness was normal. Systolic function was normal. ------------------------------------------------------------------- Pulmonic valve:  Structurally normal valve.  Cusp separation was normal. Doppler: Transvalvular velocity was within the normal range. There was no evidence for stenosis. There was no regurgitation. ------------------------------------------------------------------- Tricuspid valve:  Structurally normal valve.  Doppler: Transvalvular velocity was within the normal range. There was mild regurgitation. ------------------------------------------------------------------ Pulmonary artery:  The main pulmonary artery was normal-sized. Systolic pressure was within the normal range. ------------------------------------------------------------------- Right atrium: The atrium was normal in size. ------------------------------------------------------------------- Pericardium: There was no pericardial effusion. ------------------------------------------------------------------- Systemic veins: Inferior vena cava: The vessel was normal in size. ------------------------------------------------------------------- Measurements  Pulmonary arteries          Value    Reference PA pressure, S, DP       (H)  41  mm Hg <=30  Tricuspid valve            Value    Reference Tricuspid regurg peak velocity    306  cm/s --------- Tricuspid peak  RV-RA gradient     37  mm Hg ---------  Systemic veins            Value    Reference Estimated CVP  3   mm Hg ---------  Right ventricle            Value    Reference RV pressure, S, DP       (H)  40  mm Hg <=30  Legend: (L) and (H) mark values outside specified reference range. ------------------------------------------------------------------- Prepared and Electronically Authenticated by  Fransico Him, MD   Admission HPI: Ms. 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 She had just completed a prolonged hospital course (9/5-10/8) and stay in inpatient rehab (10/10 - 10/20) for the aforementioned problems. While in inpatient rehab, she continued to spike fevers as high as 103. Potential sources of infection included a potential endocarditis, PICC site, but no definitive source was found. She was continued on Zosyn as an inpatient until 10/19 and received vancomycin IV from her dialysis center on [address] until 10/25. After discharge on 10/20, Ms. Vandermeer's mother said she has continued to have spiking fevers, as high as 103, chills, night sweats, left leg swelling and left leg graft pain. She also had a fall this morning due to left leg weakness, and she cannot get around without significant assistance. She reports that she continues to lie in bed all day. Today, she also endorses shortness of breath, non-radiating chest pain, nausea (no emesis), occasional loose non-bloody stool, diminished appetite. She denies any dysuria, but she has only been producing small amounts of urine, and she denies any dysuria. She has been able to complete most of her dialysis sessions, but had to cut one short because of "not feeling well." According to her and  her mother, she has not appreciably improved since discharge. Notably, she has not been taking her coumadin, which was prescribed for a valvular vegetation.  Hospital Course by problem list: Pseudomonas Sepsis Blood cultures 10/28 found positive for GNR and antibiotic therapy with ceftazidime was changed to zosyn. MRI of the head demonstrated progression of her previous R MCA stroke with new small diffusion restriction in R caudate and lentiform nucleus. On 10/29 R femoral catheter was removed with plan for line holiday until later use of her L femoral AVG. Blood pressure initially remained stable at 110s SBP but worsened during the day and she was transferred to critical care service due to hypotension, tachycardia, and lactatemia that did not respond to fluid resuscitation. Pseudomonal infection identified and abx were changed to meropenem and levaquin. HD was delayed due to soft BP until 11/1 when she was dialyzed through left thigh AVG for the first time. She remained afebrile with negative repeat blood cultures but continued soft BPs on HD down to 60s/40s despite starting midodrine. Due to the degree of deconditioning and some progression of stroke infarct she was evaluated for CIR where she will also continue IV ceftaz at HD.  Endocarditis of mitral valve TEE from 11/2 demonstrated no vegetations on mitral valve, resolution of the previously identified noninfectious valvular lesions found in September. Due to resolution of this her progression of infarct was most attributed most likely to sepsis, with a possible contribution from her repeated hypotension.   Discharge Vitals:   BP 127/74 mmHg  Pulse 97  Temp(Src) 98 F (36.7 C) (Oral)  Resp 18  Ht 5\' 3"  (1.6 m)  Wt 90.81 kg (200 lb 3.2 oz)  BMI 35.47 kg/m2  SpO2  100%  LMP 01/21/2015  Discharge Labs:  Results for orders placed or performed during the hospital encounter of 01/25/15 (from the past 24 hour(s))  Renal function panel      Status: Abnormal   Collection Time: 02/04/15  4:15 AM  Result Value Ref Range   Sodium 136 135 - 145 mmol/L   Potassium 3.5 3.5 - 5.1 mmol/L   Chloride 102 101 - 111 mmol/L   CO2 28 22 - 32 mmol/L   Glucose, Bld 75 65 - 99 mg/dL   BUN 14 6 - 20 mg/dL   Creatinine, Ser 5.74 (H) 0.44 - 1.00 mg/dL   Calcium 8.3 (L) 8.9 - 10.3 mg/dL   Phosphorus 4.4 2.5 - 4.6 mg/dL   Albumin 1.5 (L) 3.5 - 5.0 g/dL   GFR calc non Af Amer 8 (L) >60 mL/min   GFR calc Af Amer 10 (L) >60 mL/min   Anion gap 6 5 - 15    Signed: Collier Salina, MD 02/04/2015, 3:20 PM

## 2015-02-04 NOTE — Progress Notes (Signed)
Ankit Lorie Phenix, MD Physician Signed Physical Medicine and Rehabilitation Consult Note 02/04/2015 11:09 AM  Related encounter: ED to Hosp-Admission (Discharged) from 01/25/2015 in [facility] Collapse All        Physical Medicine and Rehabilitation Consult Reason for Consult: Right basal ganglia infarct Referring Physician: Dr. Johnnye Sima   HPI: [name] is a 39 y.o. 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 Received inpatient rehabilitation services 01/07/2015 to 01/17/2015 after right MCA infarct. She was discharged to home ambulating modified independent. Patient lives with her mother and 2 children ages 23 and 93 and used a walker prior to admission. She does not have 24/7 support on discharge. One level home. Presented 01/25/2015 with low-grade fevers and placed on broad-spectrum antibiotics. Culture showed pansensitive Pseudomonas catheter line bloodstream infection. Hospital course bouts of hypotension. Neurology consulted 01/25/2015 with left leg weakness. MRI of the brain showed medial expansion of anterior right MCA territory infarct and acute right basal ganglia infarct. Patient had been on Coumadin which was recently stopped. Coumadin currently on hold with recent TEE showing no evidence of vegetations and reassess after antibiotics completed.. Tolerating a regular consistency diet. Acute on chronic anemia 8.9-10.5 and monitored. Currently maintained on IV Fortaz through 02/09/2015. Physical therapy evaluation completed and ongoing. Patient needing ongoing encouragement to participate. Request made for physical medicine rehabilitation consult.  Review of Systems  Constitutional: Negative for fever and chills.  HENT: Negative for hearing loss.  Eyes: Negative for blurred vision and  double vision.  Respiratory: Negative for cough.   Shortness of breath with exertion  Gastrointestinal: Positive for constipation. Negative for nausea and vomiting.   GERD  Musculoskeletal: Positive for myalgias and joint pain.  Skin: Negative for rash.  Neurological: Positive for weakness and headaches. Negative for seizures.  Psychiatric/Behavioral:   Anxiety  All other systems reviewed and are negative.  Past Medical History  Diagnosis Date  . Hypertension   . Cardiac arrhythmia   . SVT (supraventricular tachycardia) ([place])     "today, last week, 2 wk ago; maybe 1 month ago, etc; started w/in last 3-4 yrs"(07/20/2012)  . Fainting     "~ 1 month ago; probably related to SVT" (07/20/2012)  . Shortness of breath     "related to SVT episodes" (07/20/2012)  . Chest pain at rest     "related to SVT" (07/20/2012)  . Lupus (systemic lupus erythematosus) ([place])   . GERD (gastroesophageal reflux disease)   . Fibromyalgia   . Irritable bowel syndrome (IBS)   . Hypercholesterolemia   . Heart murmur     "small" (12/25/2014)  . TIA (transient ischemic attack) 12/21/2014  . Sleep apnea     "don't wear mask anymore" (12/25/2014)  . Anemia   . History of blood transfusion "several"    "related to low counts"  . Daily headache   . Arthritis     "hips" (12/25/2014)  . Anxiety     "sometimes; don't take anything for it" (12/25/2014)  . 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 Repeat biopsy early 2016 membranous w/o activity so meds weaned off. Big flare Aug '16 creat 6, 3rd biopsy DPGN, meds resumed. Ended up starting HD Sept 2016  . Stroke ([place])   .  History of vascular access device     2016: Sept 9 R IJ cath per IR. Sept 20 not candidate for fistula due to disease veins, had LUA  hybrid graft placed. Sept 21 - steal syndrome, LUA AVG ligated. Oct 7 - new left femoral Gore-tex loop graft per [facility]. Oct 29 - removal R IJ tunneled HD cath   Past Surgical History  Procedure Laterality Date  . Cesarean section  2001; 2010  . Cervical biopsy  2013  . Supraventricular tachycardia ablation  07/21/12     Dr. Cristopher Peru   . Peripheral vascular catheterization N/A 12/14/2014    Procedure: Upper Extremity Venography; Surgeon: Elam Dutch, MD; Location: [facility]; Service: Cardiovascular; Laterality: N/A;  . Insertion hybrid anteriovenous gortex graft Left 12/18/2014    Procedure: INSERTION GORE HYBRID ARTERIOVENOUS GRAFT LEFT AXILLO-BRACHIAL.; Surgeon: Serafina Mitchell, MD; Location: [facility] OR; Service: Vascular; Laterality: Left;  . Ligation of arteriovenous fistula Left 12/19/2014    Procedure: LIGATION OF FISTULA; Surgeon: Elam Dutch, MD; Location: [facility]; Service: Vascular; Laterality: Left;  . Appendectomy  2011  . Tubal ligation  2010  . Tee without cardioversion N/A 12/25/2014    Procedure: TRANSESOPHAGEAL ECHOCARDIOGRAM (TEE); Surgeon: Sueanne Margarita, MD; Location: [facility]; Service: Cardiovascular; Laterality: N/A;  . Av fistula placement Left 01/04/2015    Procedure: INSERTION OF ARTERIOVENOUS (AV) GORE-TEX GRAFT THIGH; Surgeon: Rosetta Posner, MD; Location: [facility]; Service: Vascular; Laterality: Left;  . Tee without cardioversion N/A 01/30/2015    Procedure: TRANSESOPHAGEAL ECHOCARDIOGRAM (TEE); Surgeon: Lelon Perla, MD; [facility]; Service: Cardiovascular; Laterality: N/A;   Family History  Problem Relation Age of Onset  . Cancer Mother     breast and ovarian  . BRCA 1/2 Sister    Social History:  reports that she has never smoked. She has never used smokeless tobacco. She reports that she does not drink alcohol or use illicit  drugs. Allergies:  Allergies  Allergen Reactions  . Food Swelling    Red peppers   Medications Prior to Admission  Medication Sig Dispense Refill  . atorvastatin (LIPITOR) 20 MG tablet Take 1 tablet (20 mg total) by mouth daily at 6 PM. 30 tablet 2  . Calcium Carbonate Antacid (TUMS PO) Take 1-2 tablets by mouth daily as needed (heartburn).    . cyclobenzaprine (FLEXERIL) 10 MG tablet Take 1 tablet (10 mg total) by mouth at bedtime as needed for muscle spasms. 30 tablet 0  . hydroxychloroquine (PLAQUENIL) 200 MG tablet Take 2 tablets (400 mg total) by mouth daily. 60 tablet 1  . isosorbide mononitrate (IMDUR) 30 MG 24 hr tablet Take 1 tablet (30 mg total) by mouth daily. 30 tablet 1  . metoprolol succinate (TOPROL-XL) 25 MG 24 hr tablet Take 1 tablet (25 mg total) by mouth at bedtime. 30 tablet 1  . multivitamin (RENA-VIT) TABS tablet Take 1 tablet by mouth at bedtime. 30 tablet 0  . oxyCODONE-acetaminophen (PERCOCET) 5-325 MG tablet Take 1 tablet by mouth every 6 (six) hours as needed for severe pain. 90 tablet 0  . pantoprazole (PROTONIX) 20 MG tablet Take 1 tablet (20 mg total) by mouth daily. 30 tablet 1  . vancomycin (VANCOCIN) 1 GM/200ML SOLN Inject 200 mLs (1,000 mg total) into the vein Every Tuesday,Thursday,and Saturday with dialysis. 4000 mL     Home: Home Living Family/patient expects to be discharged to:: Private residence Living Arrangements: Children, Parent (6 and 51 y.o children ) Available Help at  Discharge: Family, Available 24 hours/day Type of Home: Apartment Home Access: Level entry, Ramped entrance Home Layout: One level Bathroom Shower/Tub: Tub/shower unit, Architectural technologist: Standard Bathroom Accessibility: Yes Home Equipment: Bedside commode, Walker - standard, Walker - 2 wheels Lives With: (lives with MOm stays at home and 2 children (6 and 15))  Functional History: Prior  Function Level of Independence: Needs assistance Gait / Transfers Assistance Needed: mod I with RW ADL's / Homemaking Assistance Needed: Requires assist with IADL Comments: prior to admission 10/16, pt was fully independent  Functional Status:  Mobility: Bed Mobility Overal bed mobility: Needs Assistance Bed Mobility: Rolling, Supine to Sit Rolling: Min assist Supine to sit: Min assist, HOB elevated Sit to supine: Max assist, +2 for physical assistance General bed mobility comments: Min assist to support LLE out of bed. VC for technique and assist for patient to pull through PTs hand to achieve EOB sitting. Able to scoot self to EOB. HOB was elevated Transfers Overall transfer level: Needs assistance Equipment used: Rolling walker (2 wheeled) Transfers: Sit to/from Stand Sit to Stand: Min assist Stand pivot transfers: Min assist General transfer comment: Min assist for boost to stand. [place] for [employer]. VC for hand placement. Pt felt lightheaded upon standing and required to sit. Attempted second time long enough for BP (see vitals tab for orthostatics). HR elevated to 145 with each attempt. Third trial patient able to stand and pivot to chair with min assist for walker control and cues for safety. HR decreases upon sitting each time. Ambulation/Gait General Gait Details: Not performed due to elevated HR and feeling lightheaded    ADL: ADL Overall ADL's : Needs assistance/impaired Eating/Feeding: Set up, Sitting, Bed level Grooming: Wash/dry hands, Wash/dry face, Oral care, Set up, Bed level Upper Body Bathing: Minimal assitance, Sitting, Bed level Lower Body Bathing: Maximal assistance, Sit to/from stand Upper Body Dressing : Moderate assistance, Sitting Lower Body Dressing: Total assistance, Sit to/from stand Toilet Transfer: Moderate assistance, Stand-pivot, BSC Toileting- Clothing Manipulation and Hygiene: Total assistance Functional mobility during ADLs: Maximal assistance,  +2 for physical assistance General ADL Comments: Pt very limited and depressed. Unable to talk pt into doing much of anything during therapy but she still feels she can go home and not to rehab. Feel she will be very difficult to care for at home and reviewed with her at length the reasons this therapist feels rehab is necessary.  Cognition: Cognition Overall Cognitive Status: Within Functional Limits for tasks assessed Orientation Level: Oriented X4 Cognition Arousal/Alertness: Awake/alert Behavior During Therapy: Flat affect Overall Cognitive Status: Within Functional Limits for tasks assessed Area of Impairment: Safety/judgement Safety/Judgement: Decreased awareness of safety, Decreased awareness of deficits General Comments: Pt feels she is okay to go home despite all the assist she is requiring. Mother would like her to get rehab. pt stated she would go but really does not want to. Insight limited.  Blood pressure 127/74, pulse 97, temperature 98 F (36.7 C), temperature source Oral, resp. rate 18, height 5' 3"  (1.6 m), weight 90.81 kg (200 lb 3.2 oz), last menstrual period 01/21/2015, SpO2 100 %. Physical Exam  Vitals reviewed. Constitutional: She is oriented to person, place, and time. She appears well-developed and well-nourished.  HENT:  Head: Normocephalic and atraumatic.  Eyes: EOM are normal.  Injected sclera  Neck: Normal range of motion. Neck supple. No thyromegaly present.  Cardiovascular: Normal rate and regular rhythm.  ?Murmur  Respiratory: Effort normal and breath sounds normal.  GI: Soft. Bowel sounds  are normal. She exhibits no distension.  Musculoskeletal: She exhibits no edema or tenderness.  Strength 4/5 throughout  Neurological: She is alert and oriented to person, place, and time.  Mood is flat but appropriate.  Follows simple commands Sensation intact to light touch  Skin: Skin is warm and dry.  Psychiatric: She has a normal mood and affect. Her  behavior is normal.     Lab Results Last 24 Hours    Results for orders placed or performed during the hospital encounter of 01/25/15 (from the past 24 hour(s))  Renal function panel Status: Abnormal   Collection Time: 02/04/15 4:15 AM  Result Value Ref Range   Sodium 136 135 - 145 mmol/L   Potassium 3.5 3.5 - 5.1 mmol/L   Chloride 102 101 - 111 mmol/L   CO2 28 22 - 32 mmol/L   Glucose, Bld 75 65 - 99 mg/dL   BUN 14 6 - 20 mg/dL   Creatinine, Ser 5.74 (H) 0.44 - 1.00 mg/dL   Calcium 8.3 (L) 8.9 - 10.3 mg/dL   Phosphorus 4.4 2.5 - 4.6 mg/dL   Albumin 1.5 (L) 3.5 - 5.0 g/dL   GFR calc non Af Amer 8 (L) >60 mL/min   GFR calc Af Amer 10 (L) >60 mL/min   Anion gap 6 5 - 15      Imaging Results (Last 48 hours)    No results found.    Assessment/Plan: Diagnosis: Debilitation/sepsis Labs and images independently reviewed. Records reviewed and summated above. Stroke:  Continue secondary stroke prophylaxis and Risk Factor Modification listed below:  Antiplatelet therapy: Await final recs for restarting Statin Agent:   1. Does the need for close, 24 hr/day medical supervision in concert with the patient's rehab needs make it unreasonable for this patient to be served in a less intensive setting? Yes 2. 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 Due to bowel management, safety, disease management, medication administration and patient education, does the patient require 24 hr/day rehab nursing? Yes 4. Does the patient require coordinated care of a physician, rehab nurse, PT (1.5-2 hrs/day, 5 days/week) and OT (1.5-2 hrs/day, 5 days/week) to  address physical and functional deficits in the context of the above medical diagnosis(es)? Yes Addressing deficits in the following areas: balance, endurance, locomotion, strength, transferring, bathing, dressing, grooming, toileting and psychosocial support 5. Can the patient actively participate in an intensive therapy program of at least 3 hrs of therapy per day at least 5 days per week? Potentially 6. The potential for patient to make measurable gains while on inpatient rehab is good 7. Anticipated functional outcomes upon discharge from inpatient rehab are modified independent and supervision with PT, modified independent and supervision with OT, n/a with SLP. 8. Estimated rehab length of stay to reach the above functional goals is: 16-19 days. 9. Does the patient have adequate social supports and living environment to accommodate these discharge functional goals? Potentially 10. Anticipated D/C setting: Home 11. Anticipated post D/C treatments: HH therapy and Home excercise program 12. Overall Rehab/Functional Prognosis: good  RECOMMENDATIONS: [name] condition is appropriate for continued rehabilitative care in the following setting: CIR Patient has agreed to participate in recommended program. Yes Note that insurance prior authorization may be required for reimbursement for recommended care.  Comment: Rehab Admissions Coordinator  to follow up.   Delice Lesch, MD 02/04/2015       Revision History     Date/Time User Provider Type Action   02/04/2015 2:26 PM Ankit Lorie Phenix, MD Physician Sign   02/04/2015 12:37 PM Cathlyn Parsons, PA-C Physician Assistant Pend   View Details Report       Routing History     Date/Time From To Method   02/04/2015 2:26 PM Ankit Lorie Phenix, MD Ankit Lorie Phenix, MD In Basket   02/04/2015 2:26 PM Ankit Lorie Phenix, MD Lin Landsman, MD Fax

## 2015-02-04 NOTE — Progress Notes (Signed)
  Date: 02/04/2015  Patient name: [name]  Medical record number: [id number]  Date of birth: 09/16/1975   [name] plan of care was discussed with the house staff. Please see their note for complete details. I concur with their findings.  Pt feeling better, frustrated about d/c planning.   1. Psudomonas sepsis Resolved.  She will complete 14 days of ceftaz at hd (end date 11-12)  2. ESRD, hypotension Planning for d/c soon Possible midodrine at home  Campbell Riches, MD 02/04/2015, 11:27 AM

## 2015-02-05 ENCOUNTER — Inpatient Hospital Stay (HOSPITAL_COMMUNITY): Payer: Medicaid Other

## 2015-02-05 ENCOUNTER — Inpatient Hospital Stay (HOSPITAL_COMMUNITY): Payer: Medicaid Other | Admitting: Occupational Therapy

## 2015-02-05 DIAGNOSIS — G8194 Hemiplegia, unspecified affecting left nondominant side: Secondary | ICD-10-CM

## 2015-02-05 DIAGNOSIS — Z992 Dependence on renal dialysis: Secondary | ICD-10-CM

## 2015-02-05 DIAGNOSIS — A4152 Sepsis due to Pseudomonas: Secondary | ICD-10-CM

## 2015-02-05 DIAGNOSIS — I120 Hypertensive chronic kidney disease with stage 5 chronic kidney disease or end stage renal disease: Secondary | ICD-10-CM

## 2015-02-05 DIAGNOSIS — I69354 Hemiplegia and hemiparesis following cerebral infarction affecting left non-dominant side: Secondary | ICD-10-CM

## 2015-02-05 DIAGNOSIS — N186 End stage renal disease: Secondary | ICD-10-CM

## 2015-02-05 MED ORDER — ALBUMIN HUMAN 25 % IV SOLN
INTRAVENOUS | Status: AC
  Administered 2015-02-05: 12.5 g
  Filled 2015-02-05: qty 50

## 2015-02-05 NOTE — Progress Notes (Signed)
Social Work Social Work Assessment and Plan  Patient Details  Name: [name] MRN: [id number] Date of Birth: 1976-03-20  Today's Date: 02/05/2015  Problem List:  Patient Active Problem List   Diagnosis Date Noted  . History of vascular access device   . End-stage renal disease on hemodialysis ([place])   . Pseudomonas sepsis ([place])   . Fever, unspecified 01/25/2015  . Sepsis ([place]) 01/25/2015  . Adjustment disorder with depressed mood   . Post-operative pain 01/10/2015  . Left hemiparesis ([place])   . 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 Anemia due to bone marrow failure ([place])   . 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 Other pancytopenia ([place])   . Pressure ulcer 12/21/2014  . Steal syndrome as complication of dialysis access ([place])   . ESRD (end stage renal disease) on dialysis ([place])   . UTI (lower urinary tract infection)   . Retroperitoneal hematoma   . Lupus nephritis ([place])   . Hypertension 07/20/2012  . Weight loss, unintentional 07/20/2012   Past Medical History:  Past Medical History  Diagnosis Date  . Hypertension   . Cardiac arrhythmia   . SVT (supraventricular tachycardia) ([place])     "today, last week, 2 wk ago; maybe 1 month ago, etc; started w/in last 3-4 yrs"(07/20/2012)  . Fainting     "~ 1 month ago; probably related to SVT" (07/20/2012)  . Shortness of breath     "related to SVT episodes" (07/20/2012)  . Chest pain at rest     "related to SVT" (07/20/2012)  . Lupus (systemic lupus erythematosus) ([place])   . GERD (gastroesophageal reflux disease)   . Fibromyalgia   . Irritable bowel syndrome (IBS)   . Hypercholesterolemia   . Heart murmur     "small" (12/25/2014)  . TIA (transient ischemic attack) 12/21/2014  . Sleep apnea     "don't wear mask anymore" (12/25/2014)  . Anemia   . History of blood transfusion "several"    "related to low counts"  . Daily headache   . Arthritis     "hips" (12/25/2014)  . Anxiety     "sometimes; don't  take anything for it" (12/25/2014)  . ESRD (end stage renal disease) on dialysis ([place]) since 12/07/2014    Diagnosed Aug 2014 w SLE nephritis DPGN by biopsy, rx cellcept/ steroids.  Repeat biopsy early 2016 membranous w/o activity so meds weaned off. Big flare Aug '16 creat 6, 3rd biopsy DPGN, meds resumed. Ended up starting HD Sept 2016  . Stroke ([place])   . History of vascular access device     2016: Sept 9  R IJ cath per IR.  Sept 20 not candidate for fistula due to disease veins, had LUA hybrid graft placed. Sept 21 - steal syndrome, LUA AVG ligated. Oct 7 - new left femoral Gore-tex loop graft per [facility]. Oct 29 - removal R IJ tunneled HD cath   Past Surgical History:  Past Surgical History  Procedure Laterality Date  . Cesarean section  2001; 2010  . Cervical biopsy  2013  . Supraventricular tachycardia ablation  07/21/12     Dr. Cristopher Peru   . Peripheral vascular catheterization N/A 12/14/2014    Procedure: Upper Extremity Venography;  Surgeon: Elam Dutch, MD;  Location: [facility];  Service: Cardiovascular;  Laterality: N/A;  . Insertion hybrid anteriovenous gortex graft Left 12/18/2014    Procedure: INSERTION GORE HYBRID ARTERIOVENOUS GRAFT LEFT AXILLO-BRACHIAL.;  Surgeon: Serafina Mitchell, MD;  Location: [facility];  Service: Vascular;  Laterality: Left;  . Ligation of arteriovenous  fistula Left 12/19/2014    Procedure: LIGATION OF FISTULA;  Surgeon: Elam Dutch, MD;  Location: [facility];  Service: Vascular;  Laterality: Left;  . Appendectomy  2011  . Tubal ligation  2010  . Tee without cardioversion N/A 12/25/2014    Procedure: TRANSESOPHAGEAL ECHOCARDIOGRAM (TEE);  Surgeon: Sueanne Margarita, MD;  Location: [facility];  Service: Cardiovascular;  Laterality: N/A;  . Av fistula placement Left 01/04/2015    Procedure: INSERTION OF ARTERIOVENOUS (AV) GORE-TEX GRAFT THIGH;  Surgeon: Rosetta Posner, MD;  Location: [facility];  Service: Vascular;  Laterality: Left;  . Tee without cardioversion  N/A 01/30/2015    Procedure: TRANSESOPHAGEAL ECHOCARDIOGRAM (TEE);  Surgeon: Lelon Perla, MD;  Location: [facility];  Service: Cardiovascular;  Laterality: N/A;   Social History:  reports that she has never smoked. She has never used smokeless tobacco. She reports that she does not drink alcohol or use illicit drugs.  Family / Support Systems Marital Status: Single Patient Roles: Parent, Caregiver Children: 66 yo son and 49 yo daughter Other Supports: Langley Gauss Hilton-Mom  [phone]   Nichole-sister  [phone] Anticipated Caregiver: Mom Ability/Limitations of Caregiver: Mo can provide assist was prior to admission Caregiver Availability: 24/7 Family Dynamics: Close with Mom lives with her when her health issues started to get bad. She has two sister's who are supportive and visit her often. She is discouraged she is back in the hospital and wants to be home with her children. She feels it is a regaining her strength issue while here  Social History Preferred language: English Religion: Baptist Cultural Background: No issues Education: [employer] Read: Yes Write: Yes Employment Status: Disabled Date Retired/Disabled/Unemployed: applied for SSD/SSI 12/21/2014 when unable to work due to health issues Name of Employer: [employer] Return to Work Plans: No Freight forwarder Issues: No issues Guardian/Conservator: None-according to MD pt is capable of making her own decisions while here   Abuse/Neglect Physical Abuse: Denies Verbal Abuse: Denies Sexual Abuse: Denies Exploitation of patient/patient's resources: Denies Self-Neglect: Denies  Emotional Status Pt's affect, behavior adn adjustment status: Pt is not as birght and somewhat discouraged by her infection and needing to come back into the hospital. She wants to be at home with her children. She will work hard in therapies so she can return home soon. Mom is supportive and visits daily here. Recent  Psychosocial Issues: Past CVA and still has weakness from this along with her HD and Lupus Pyschiatric History: has been seen by neuro-psych when here in Oct, will refer once again due to pt found it helpful in her coping. Her reactions appear to be slower and takes more time to answer the questions being asked of her. She is on something for her anxiety and feels this helps her.  Will follow closely to provide support during this hospitalization. Substance Abuse History: No issues  Patient / Family Perceptions, Expectations & Goals Pt/Family understanding of illness & functional limitations: Pt and Mom have a good understanding fo her hospitalization.  Although pt feels it is just for her infection, doesn;t notice increased weakness from her stroke. Both talk with the MD and are aware of her treatment plan while here. Premorbid pt/family roles/activities: Mom, daughter, dialysis patient, sister, etc Anticipated changes in roles/activities/participation: resume these roles upon discharge Pt/family expectations/goals: Pt states: " I just want to get well and go home I was just here."  Mom states: " I am hopeful she can get back to walking with her rollator rolling walker, like when she left here before."  [employer]: Other (Comment) (HD-T, TH & Sat-[address]) Premorbid Home Care/DME Agencies: Other (Comment) (Active [facility] pt) Transportation available at discharge: Has applied for [facility] and Mom, but used to take the bus Resource referrals recommended: Neuropsychology, Support group (specify)  Discharge Planning Living Arrangements: Children, Parent Support Systems: Children, Parent, Other relatives, Friends/neighbors Type of Residence: Private residence Insurance Resources: Medicaid (specify county) ([place]) Museum/gallery curator Screen Referred: Previously completed Living Expenses: Education officer, community Management: Patient, Family Does the patient have any problems obtaining your  medications?: No Home Management: Mom pt does try to help with what she can but is limited Patient/Family Preliminary Plans: Return home with Mom and her children.  Mom is there most fo the time, she doesn;t work, but there are times she needs to run errands and pick up her grandchildren. Will await pt's goals and work on a safe discharge plan. Mom has been assisitng with pt's care prior to admission. Social Work Anticipated Follow Up Needs: HH/OP, Support Group  Clinical Impression This patient is well known to me due to was her in Oct 2016.  Pt is somewhat discouraged by her need to come back into the hospital with other health issues-sepsis. She wants to do well and go home as soon as Possible, but get to a level where her Mom doesn't not have to  physically assist her. She wants to be with her children, worries about her 49 yo daughter when she is here. Benefited from neuro-psych intervention last  Time will refer again. Will provide support and work on discharge plan.  [name] 02/05/2015, 9:46 AM

## 2015-02-05 NOTE — Progress Notes (Addendum)
Patient ID: [name], female   DOB: 02/17/1976, 39 y.o.   MRN: [id number]  [facility] Progress Note   Assessment/ Plan:   1. Pseudomonas sepsis: Suspected to be catheter related bacteremia prompting discontinuation of dialysis catheter and initiation of use of her left thigh graft. Remains on Fortaz until 11/12 2. ESRD: Continue hemodialysis on a Tuesday/Thursday/Saturday schedule with dialysis today. Dialysis will be likely done in the later part of the day to facilitate PT/OT at rehabilitation earlier. 3. Anemia of chronic kidney disease: Stable hemoglobin, continue ESA. No overt losses noted. 4. Metabolic bone disease: Phosphorus levels noted-will check predialysis labs to follow-up on need for phosphate binder. Not on vitamin D receptor analogues. 5. Hypotension: Continue midodrine for blood pressure support and ultrafiltration for volume management. 6. Malnutrition/deconditioning: Continue inpatient rehabilitation with dietary nutritional supplementation  Subjective:   Reports to be doing well and had an uneventful night-looking forward to dialysis later today    Objective:   BP 119/66 mmHg  Pulse 92  Temp(Src) 97.6 F (36.4 C) (Oral)  Resp 19  Wt 91.2 kg (201 lb 1 oz)  SpO2 100%  LMP 01/21/2015  Physical Exam: Gen: Resting comfortably in bed and working with physical therapy CVS: Pulse regular in rate and rhythm, S1 and S2 normal Resp: Clear to auscultation, no rales Abd: Soft, obese, nontender Ext: Some edema over left thigh AVG. No pretibial/ankle edema   Labs: BMET  Recent Labs Lab 01/30/15 0308 01/31/15 0357 02/01/15 0353 02/02/15 0459 02/03/15 0302 02/04/15 0415 02/04/15 1930  NA 130* 137 138 135 137 136 136  K 3.6 3.4* 3.5 3.3* 3.9 3.5 3.7  CL 95* 103 101 99* 100* 102 99*  CO2 28 27 27 27 30 28 27   GLUCOSE 67 67 59* 74 79 75 79  BUN 16 20 13 19 9 14 17   CREATININE 4.82* 6.06* 4.94* 6.20* 4.19* 5.74* 6.39*  CALCIUM 7.4* 7.8* 8.0*  8.3* 8.3* 8.3* 8.6*  PHOS 1.9* 2.6 2.7 3.9 3.3 4.4 5.0*   CBC  Recent Labs Lab 01/30/15 0308 01/31/15 0357 02/01/15 0353 02/04/15 1930  WBC 6.4 4.9 7.2 11.7*  HGB 9.8* 9.3* 10.5* 12.1  HCT 29.4* 28.8* 32.5* 38.1  MCV 91.0 91.4 91.3 94.3  PLT 77* 104* 88* 127*   Medications:    . atorvastatin  20 mg Oral q1800  . cefTAZidime (FORTAZ)  IV  2 g Intravenous Q T,Th,Sa-HD  . [START ON 02/07/2015] darbepoetin (ARANESP) injection - DIALYSIS  200 mcg Intravenous Q Thu-HD  . feeding supplement (PRO-STAT SUGAR FREE 64)  30 mL Oral BID  . hydroxychloroquine  400 mg Oral Daily  . metoprolol succinate  25 mg Oral QHS  . midodrine  10 mg Oral BID WC  . multivitamin  1 tablet Oral QHS  . pantoprazole  40 mg Oral Daily   Elmarie Shiley, MD 02/05/2015, 10:26 AM

## 2015-02-05 NOTE — Evaluation (Addendum)
Physical Therapy Assessment and Plan  Patient Details  Name: Lucendia Leard MRN: [id number] Date of Birth: Jun 02, 1975  PT Diagnosis: Abnormal posture, Abnormality of gait, Cognitive deficits, Hemiparesis non-dominant and Pain in joint L knee Rehab Potential: Good ELOS: 12-14   Today's Date: 02/05/2015 PT Individual Time: 1005-1105 PT Individual Time Calculation (min): 60 min    Problem List:  Patient Active Problem List   Diagnosis Date Noted  . History of vascular access device   . End-stage renal disease on hemodialysis ([place])   . Pseudomonas sepsis ([place])   . Fever, unspecified 01/25/2015  . Sepsis ([place]) 01/25/2015  . Adjustment disorder with depressed mood   . Post-operative pain 01/10/2015  . Left hemiparesis ([place])   . 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 Anemia due to bone marrow failure ([place])   . 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 Other pancytopenia ([place])   . Pressure ulcer 12/21/2014  . Steal syndrome as complication of dialysis access ([place])   . ESRD (end stage renal disease) on dialysis ([place])   . UTI (lower urinary tract infection)   . Retroperitoneal hematoma   . Lupus nephritis ([place])   . Hypertension 07/20/2012  . Weight loss, unintentional 07/20/2012    Past Medical History:  Past Medical History  Diagnosis Date  . Hypertension   . Cardiac arrhythmia   . SVT (supraventricular tachycardia) ([place])     "today, last week, 2 wk ago; maybe 1 month ago, etc; started w/in last 3-4 yrs"(07/20/2012)  . Fainting     "~ 1 month ago; probably related to SVT" (07/20/2012)  . Shortness of breath     "related to SVT episodes" (07/20/2012)  . Chest pain at rest     "related to SVT" (07/20/2012)  . Lupus (systemic lupus erythematosus) ([place])   . GERD (gastroesophageal reflux disease)   . Fibromyalgia   . Irritable bowel syndrome (IBS)   . Hypercholesterolemia   . Heart murmur     "small" (12/25/2014)  . TIA (transient ischemic attack) 12/21/2014   . Sleep apnea     "don't wear mask anymore" (12/25/2014)  . Anemia   . History of blood transfusion "several"    "related to low counts"  . Daily headache   . Arthritis     "hips" (12/25/2014)  . Anxiety     "sometimes; don't take anything for it" (12/25/2014)  . ESRD (end stage renal disease) on dialysis ([place]) since 12/07/2014    Diagnosed Aug 2014 w SLE nephritis DPGN by biopsy, rx cellcept/ steroids.  Repeat biopsy early 2016 membranous w/o activity so meds weaned off. Big flare Aug '16 creat 6, 3rd biopsy DPGN, meds resumed. Ended up starting HD Sept 2016  . Stroke ([place])   . History of vascular access device     2016: Sept 9  R IJ cath per IR.  Sept 20 not candidate for fistula due to disease veins, had LUA hybrid graft placed. Sept 21 - steal syndrome, LUA AVG ligated. Oct 7 - new left femoral Gore-tex loop graft per VVS. Oct 29 - removal R IJ tunneled HD cath   Past Surgical History:  Past Surgical History  Procedure Laterality Date  . Cesarean section  2001; 2010  . Cervical biopsy  2013  . Supraventricular tachycardia ablation  07/21/12     Dr. Cristopher Peru   . Peripheral vascular catheterization N/A 12/14/2014    Procedure: Upper Extremity Venography;  Surgeon: Elam Dutch, MD;  Location: [facility]  INVASIVE CV LAB;  Service: Cardiovascular;  Laterality: N/A;  . Insertion hybrid anteriovenous gortex graft Left 12/18/2014    Procedure: INSERTION GORE HYBRID ARTERIOVENOUS GRAFT LEFT AXILLO-BRACHIAL.;  Surgeon: Serafina Mitchell, MD;  Location: [facility] OR;  Service: Vascular;  Laterality: Left;  . Ligation of arteriovenous  fistula Left 12/19/2014    Procedure: LIGATION OF FISTULA;  Surgeon: Elam Dutch, MD;  Location: [place];  Service: Vascular;  Laterality: Left;  . Appendectomy  2011  . Tubal ligation  2010  . Tee without cardioversion N/A 12/25/2014    Procedure: TRANSESOPHAGEAL ECHOCARDIOGRAM (TEE);  Surgeon: Sueanne Margarita, MD;  Location: [place];  Service: Cardiovascular;   Laterality: N/A;  . Av fistula placement Left 01/04/2015    Procedure: INSERTION OF ARTERIOVENOUS (AV) GORE-TEX GRAFT THIGH;  Surgeon: Rosetta Posner, MD;  Location: [place];  Service: Vascular;  Laterality: Left;  . Tee without cardioversion N/A 01/30/2015    Procedure: TRANSESOPHAGEAL ECHOCARDIOGRAM (TEE);  Surgeon: Lelon Perla, MD;  Location: [facility] ENDOSCOPY;  Service: Cardiovascular;  Laterality: N/A;    Assessment & Plan Clinical Impression: Aloria Looper is a 39 y.o. 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 Received inpatient rehabilitation services 01/07/2015 to 01/17/2015 after right MCA infarct. She was discharged to home ambulating modified independent. Patient lives with her mother and 2 children ages 80 and 27 and used a walker prior to admission. She does not have 24/7 support on discharge. One level home. Presented 01/25/2015 with low-grade fevers and placed on broad-spectrum antibiotics. Culture showed pansensitive Pseudomonas catheter line bloodstream infection. Hospital course bouts of hypotension. Neurology consulted 01/25/2015 with left leg weakness. MRI of the brain showed medial expansion of anterior right MCA territory infarct and acute right basal ganglia infarct. Patient had been on Coumadin which was recently stopped. Coumadin currently on hold with recent TEE showing no evidence of vegetations and reassess after antibiotics completed 02/09/2015 with question to repeat TEE.Marland KitchenMarland KitchenPatient transferred to CIR on 02/04/2015 .   Patient currently requires mod with mobility secondary to muscle weakness and muscle joint tightness, decreased cardiorespiratoy endurance, impaired timing and sequencing and unbalanced muscle activation and decreased awareness, decreased problem solving and decreased memory.  Prior to hospitalization, patient was  supervision with mobility and lived with Family in a [place] home.  Home access is  Level entry, Ramped entrance.  Patient will benefit from skilled PT intervention to maximize safe functional mobility, minimize fall risk and decrease caregiver burden for planned discharge home with 24 hour supervision for first few days after d/c.  Her mother does not typically stay with her 24/7 but pt says she will except for short errands at first.  Anticipate patient will benefit from follow up [facility] at discharge.  PT - End of Session Endurance Deficit: Yes Endurance Deficit Description: c/o SOB; tolerated gait x 6' PT Assessment Rehab Potential (ACUTE/IP ONLY): Good Barriers to Discharge: Decreased caregiver support PT Patient demonstrates impairments in the following area(s): Balance;Edema;Endurance;Motor;Pain;Skin Integrity PT Transfers Functional Problem(s): Bed Mobility;Bed to Chair;Car;Furniture PT Locomotion Functional Problem(s): Ambulation;Wheelchair Mobility;Stairs PT Plan PT Intensity: Minimum of 1-2 x/day ,45 to 90 minutes PT frequency: 5 out of 7 days PT Duration Estimated Length of Stay: 12-14 PT Treatment/Interventions: Ambulation/gait training;Balance/vestibular training;Cognitive remediation/compensation;Discharge planning;Community reintegration;DME/adaptive equipment instruction;Functional electrical stimulation;Functional mobility training;Patient/family education;Pain management;Neuromuscular re-education;Psychosocial support;Splinting/orthotics;Therapeutic Exercise;Therapeutic Activities;Stair training;UE/LE Strength taining/ROM;UE/LE Coordination activities;Wheelchair propulsion/positioning PT Transfers Anticipated Outcome(s): supervision  basic; min assist car PT Locomotion Anticipated Outcome(s): supervision gait houshold x 25', controlled environment and community x 75'; stair TBD; supervision w/c x 50' household, 150' in controlled and community environments PT  Recommendation Recommendations for Other Services: Neuropsych consult (pt tearful during eval; very discouraged at her present status) Follow Up Recommendations: Home health PT Patient destination: Home Equipment Recommended: Rolling walker with 5" wheels Equipment Details: w/c TBD  Skilled Therapeutic Intervention Pt asleep but easily aroused.  She c/o SOB; RN called and vitals taken. RN stated pt may feel some pressure on chest due to fluid build up as she is due for HD today.  Pt performed bil LE hip IR with ankle DF for improving hip alignment in bed, and L hip abd/adduction , x 20 each for neuro re-ed.  PT educated pt on edema management and encouraged frequent ankle pumps.  Pt propelled w/c x 30' slowly with supervision.  Gait +2 , with RW; pt fearful but did complete 2 steps each foot. Eval completed and LTGs and ELOS discussed with pt.  Stand pivot transfer without AD; pt with collapse unsafely into w/c stand>sit.  Pt limited by pain, fear.  She became tearful during session; very discouraged about her present status.  PT provided emotional support.  Pt returned to room to rest in w/c, awaiting next therapy.  All needs left within reach.   PT Evaluation Precautions/Restrictions Precautions Precautions: Fall Precaution Comments: Pt's femoral cath is tunneled therefore she is able to get OOB. Restrictions Weight Bearing Restrictions: No General   Vital SignsTherapy Vitals BP: 107/68 mmHg HR 98 Patient Position (if appropriate): Lying Oxygen Therapy SpO2: 100 % O2 Device: Not Delivered Pain Pain Assessment Faces Pain Scale: Hurts a little bit Pain Type: Chronic pain Pain Location: Knee Pain Orientation: Left Pain Intervention(s): Repositioned;Emotional support Multiple Pain Sites: No Home Living/Prior Functioning Home Living Available Help at Discharge: Family;Available 24 hours/day Type of Home: Apartment Home Access: Level entry;Ramped entrance Home Layout: One  level Bathroom Shower/Tub: Tub/shower unit;Curtain Biochemist, clinical: Standard  Lives With: Family Prior Function Level of Independence: Requires assistive device for independence (pt reported that she had to rest in sitting during ambulation from bed> LR in apartment)  Able to Take Stairs?: Yes (a couple, with 2 rails, per pt) Driving: No Vocation: On disability Leisure: Hobbies-yes (Comment) Comments: likes crossword puzzles Vision/Perception  Vision - Assessment Eye Alignment: Within Functional Limits Ocular Range of Motion: Within Functional Limits Alignment/Gaze Preference: Within Defined Limits Tracking/Visual Pursuits: Able to track stimulus in all quads without difficulty Convergence: Within functional limits  Cognition Overall Cognitive Status: Within Functional Limits for tasks assessed Arousal/Alertness: Lethargic Orientation Level: Oriented X4 Attention: Sustained Sustained Attention: Appears intact Memory: Impaired Memory Impairment: Decreased short term memory Decreased Short Term Memory: Functional basic;Functional complex Awareness: Impaired Problem Solving: Impaired Safety/Judgment: Impaired Comments: Pt with decreased recall of instructions for 9 hole peg test.  Pt also reported she felt like she could get up with her walker and without assistance, and be ok. Sensation Sensation Light Touch: Impaired Detail (pt reported tingling L knee area) Stereognosis: Appears Intact Hot/Cold: Appears Intact Proprioception: Appears Intact (accurate 5/5 BLE hallux) Coordination Gross Motor Movements are Fluid and Coordinated: Yes Fine Motor Movements are Fluid and Coordinated: Yes Finger Nose Finger Test: Pt with slower coordination bilaterally with regards to speed with left side being slightly slower than right. Heel Shin Test: decreased excursion bilaterally L>R, due to strength impairments, otherwise WNL Motor  Motor Motor: Hemiplegia;Abnormal postural  alignment and  control Motor - Skilled Clinical Observations: marked sacral sitting  Mobility Bed Mobility Bed Mobility: Rolling Right;Rolling Left Rolling Right: 4: Min assist Rolling Right Details: Manual facilitation for weight shifting Rolling Left: 5: Supervision Rolling Left Details: Manual facilitation for weight shifting Left Sidelying to Sit: 3: Mod assist Left Sidelying to Sit Details: Manual facilitation for weight shifting;Verbal cues for technique;Manual facilitation for weight bearing Supine to Sit: 3: Mod assist Supine to Sit Details: Verbal cues for precautions/safety;Manual facilitation for weight shifting Sit to Supine: 4: Min assist;HOB flat Sit to Supine - Details: Manual facilitation for weight shifting;Manual facilitation for placement Transfers Transfers: Yes Sit to Stand: 4: Min assist Sit to Stand Details: Verbal cues for safe use of DME/AE;Manual facilitation for weight shifting Sit to Stand Details (indicate cue type and reason): wihtout AD Stand to Sit: 3: Mod assist Stand to Sit Details (indicate cue type and reason): Manual facilitation for weight shifting;Verbal cues for precautions/safety (absent eccentric control) Stand Pivot Transfers: 3: Mod assist Locomotion  Ambulation Ambulation: Yes Ambulation/Gait Assistance: 1: +2 Total assist Ambulation Distance (Feet): 6 Feet Assistive device: Rolling walker Ambulation/Gait Assistance Details: Verbal cues for technique;Verbal cues for gait pattern;Verbal cues for sequencing Gait Gait Pattern: Trunk flexed;Wide base of support;Step-to pattern (difficult to assess due to pt's short stature, and gowns covering down far below knees) Gait velocity: decreased Stairs / Additional Locomotion Stairs: No Wheelchair Mobility Wheelchair Mobility: Yes Wheelchair Assistance: 5: Investment banker, operational Details: Verbal cues for Marketing executive: Both upper extremities Wheelchair Parts Management: Needs  assistance Distance: 30  Trunk/Postural Assessment  Cervical Assessment Cervical Assessment: Exceptions to [facility] Cervical Strength Overall Cervical Strength Comments: forward cervical flexion and protraction Thoracic Assessment Thoracic Assessment: Exceptions to [facility] (kyphotic) Lumbar Assessment Lumbar Assessment: Exceptions to [facility] Postural Control Postural Control: Deficits on evaluation Postural Limitations: Pt sits in a marked posterior pelvic tilt with increased posterior lean  Balance Static Sitting Balance Static Sitting - Balance Support: Feet supported Static Sitting - Level of Assistance: 5: Stand by assistance Dynamic Sitting Balance Dynamic Sitting - Balance Support: Feet supported Dynamic Sitting - Level of Assistance: 4: Min assist Static Standing Balance Static Standing - Balance Support: Right upper extremity supported;Left upper extremity supported Static Standing - Level of Assistance: 4: Min assist Dynamic Standing Balance Dynamic Standing - Balance Support: During functional activity;No upper extremity supported Dynamic Standing - Level of Assistance: 3: Mod assist Extremity Assessment  RUE Assessment RUE Assessment: Exceptions to [facility] (AROM shoulder flexion 0-130 degrees, shoulder strength 3/5, elbow flexion/extension 3+/5, grip strength 3+/5) LUE Assessment LUE Assessment: Exceptions to [facility] (AROM shoulder flexion 0-130 degrees, strength 3/5.  AROM WFLS for elbow flexion/extension and gross grasp and release.  Strength in all other joints except the shoulder 3+/5 as well. ) RLE Assessment RLE Assessment: Exceptions to [facility] RLE AROM (degrees) Overall AROM Right Lower Extremity: Due to premorbid status;Deficits RLE PROM (degrees) Right Knee Extension: -10 (grosslty in sitting) RLE Strength RLE Overall Strength: Deficits RLE Overall Strength Comments: grossly in sitting 4-/5 LLE Assessment LLE Assessment: Exceptions to WFL (moderate edema foot> groin) LLE AROM  (degrees) Overall AROM Left Lower Extremity: Deficits LLE PROM (degrees) Left Ankle Dorsiflexion: 3 LLE Strength LLE Overall Strength: Deficits;Due to premorbid status;Due to pain LLE Overall Strength Comments: grossly in sitting- hip flex 2-/5; hip abd/add 4-/5;knee ext /flex and ankle DF 4-/5,    See Function Navigator for Current Functional Status.   Refer to Care Plan for Long Term Goals  Recommendations for other services: Neuropsych  Discharge Criteria: Patient will be discharged from PT if patient refuses treatment 3 consecutive times without medical reason, if treatment goals not met, if there is a change in medical status, if patient makes no progress towards goals or if patient is discharged from hospital.  The above assessment, treatment plan, treatment alternatives and goals were discussed and mutually agreed upon: by patient  Shelby Peltz 02/05/2015, 5:20 PM

## 2015-02-05 NOTE — Evaluation (Signed)
Occupational Therapy Assessment and Plan  Patient Details  Name: [name] MRN: [id number] Date of Birth: [birth year]  OT Diagnosis: abnormal posture, muscular wasting and disuse atrophy and muscle weakness (generalized) Rehab Potential:   ELOS: 12-14 days   Today's Date: 02/05/2015 OT Individual Time: 0800-0905 OT Individual Time Calculation (min): 65 min     Problem List:  Patient Active Problem List   Diagnosis Date Noted  . History of vascular access device   . End-stage renal disease on hemodialysis ([place])   . Pseudomonas sepsis ([place])   . Fever, unspecified 01/25/2015  . Sepsis ([place]) 01/25/2015  . Adjustment disorder with depressed mood   . Post-operative pain 01/10/2015  . Left hemiparesis ([place])   . 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 Anemia due to bone marrow failure ([place])   . 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 Other pancytopenia ([place])   . Pressure ulcer 12/21/2014  . Steal syndrome as complication of dialysis access ([place])   . ESRD (end stage renal disease) on dialysis ([place])   . UTI (lower urinary tract infection)   . Retroperitoneal hematoma   . Lupus nephritis ([place])   . Hypertension 07/20/2012  . Weight loss, unintentional 07/20/2012    Past Medical History:  Past Medical History  Diagnosis Date  . Hypertension   . Cardiac arrhythmia   . SVT (supraventricular tachycardia) ([place])     "today, last week, 2 wk ago; maybe 1 month ago, etc; started w/in last 3-4 yrs"(07/20/2012)  . Fainting     "~ 1 month ago; probably related to SVT" (07/20/2012)  . Shortness of breath     "related to SVT episodes" (07/20/2012)  . Chest pain at rest     "related to SVT" (07/20/2012)  . Lupus (systemic lupus erythematosus) ([place])   . GERD (gastroesophageal reflux disease)   . Fibromyalgia   . Irritable bowel syndrome (IBS)   . Hypercholesterolemia   . Heart murmur     "small" (12/25/2014)  . TIA (transient ischemic attack) 12/21/2014  . Sleep apnea      "don't wear mask anymore" (12/25/2014)  . Anemia   . History of blood transfusion "several"    "related to low counts"  . Daily headache   . Arthritis     "hips" (12/25/2014)  . Anxiety     "sometimes; don't take anything for it" (12/25/2014)  . ESRD (end stage renal disease) on dialysis ([place]) since 12/07/2014    Diagnosed Aug 2014 w SLE nephritis DPGN by biopsy, rx cellcept/ steroids.  Repeat biopsy early 2016 membranous w/o activity so meds weaned off. Big flare Aug '16 creat 6, 3rd biopsy DPGN, meds resumed. Ended up starting HD Sept 2016  . Stroke ([place])   . History of vascular access device     2016: Sept 9  R IJ cath per IR.  Sept 20 not candidate for fistula due to disease veins, had LUA hybrid graft placed. Sept 21 - steal syndrome, LUA AVG ligated. Oct 7 - new left femoral Gore-tex loop graft per [facility]. Oct 29 - removal R IJ tunneled HD cath   Past Surgical History:  Past Surgical History  Procedure Laterality Date  . Cesarean section  2001; 2010  . Cervical biopsy  2013  . Supraventricular tachycardia ablation  07/21/12     Dr. Cristopher Peru   . Peripheral vascular catheterization N/A 12/14/2014    Procedure: Upper Extremity Venography;  Surgeon: Elam Dutch, MD;  Location: [facility]  CV LAB;  Service: Cardiovascular;  Laterality: N/A;  . Insertion hybrid anteriovenous gortex graft Left 12/18/2014    Procedure: INSERTION GORE HYBRID ARTERIOVENOUS GRAFT LEFT AXILLO-BRACHIAL.;  Surgeon: Serafina Mitchell, MD;  [facility] OR;  Service: Vascular;  Laterality: Left;  . Ligation of arteriovenous  fistula Left 12/19/2014    Procedure: LIGATION OF FISTULA;  Surgeon: Elam Dutch, MD;  Location: [facility];  Service: Vascular;  Laterality: Left;  . Appendectomy  2011  . Tubal ligation  2010  . Tee without cardioversion N/A 12/25/2014    Procedure: TRANSESOPHAGEAL ECHOCARDIOGRAM (TEE);  Surgeon: Sueanne Margarita, MD;  Location: [facility];  Service: Cardiovascular;  Laterality: N/A;  . Av  fistula placement Left 01/04/2015    Procedure: INSERTION OF ARTERIOVENOUS (AV) GORE-TEX GRAFT THIGH;  Surgeon: Rosetta Posner, MD;  Location: [facility];  Service: Vascular;  Laterality: Left;  . Tee without cardioversion N/A 01/30/2015    Procedure: TRANSESOPHAGEAL ECHOCARDIOGRAM (TEE);  Surgeon: Lelon Perla, MD;  Location: [facility] ENDOSCOPY;  Service: Cardiovascular;  Laterality: N/A;    Assessment & Plan Clinical Impression: Patient is a 39 y.o. year old female with recent admission to the hospital on10/28/2016 with low-grade fevers and placed on broad-spectrum antibiotics. Culture showed pansensitive Pseudomonas catheter line bloodstream infection. Hospital course bouts of hypotension. Neurology consulted 01/25/2015 with left leg weakness. MRI of the brain showed medial expansion of anterior right MCA territory infarct and acute right basal ganglia infarct. Patient transferred to CIR on 02/04/2015 .    Patient currently requires mod with basic self-care skills secondary to muscle weakness, unbalanced muscle activation and decreased coordination and decreased sitting balance, decreased standing balance, decreased postural control and decreased balance strategies.  Prior to hospitalization, patient could complete ADLs  with modified independent .  Patient will benefit from skilled intervention to decrease level of assist with basic self-care skills and increase independence with basic self-care skills prior to discharge home with family.  Anticipate patient will require 24 hour supervision and follow up home health.  OT - End of Session Activity Tolerance: Decreased this session;Tolerates < 10 min activity, no significant change in vital signs Endurance Deficit: Yes Endurance Deficit Description: frequent rest breaks during ADL session OT Plan OT Intensity: Minimum of 1-2 x/day, 45 to 90 minutes OT Frequency: 5 out of 7 days OT Duration/Estimated Length of Stay: 12-14 days OT Treatment/Interventions:  Training and development officer;Therapeutic Exercise;Self Care/advanced ADL retraining;Neuromuscular re-education;Therapeutic Activities;Patient/family education;Discharge planning;Functional mobility training;Psychosocial support;UE/LE Strength taining/ROM;DME/adaptive equipment instruction;Community reintegration;Cognitive remediation/compensation;UE/LE Coordination activities OT Self Feeding Anticipated Outcome(s): independent OT Basic Self-Care Anticipated Outcome(s): supervision OT Toileting Anticipated Outcome(s): supervision OT Bathroom Transfers Anticipated Outcome(s): supervision OT Recommendation Patient destination: Home Follow Up Recommendations: Home health OT Equipment Recommended: None recommended by OT   Skilled Therapeutic Intervention Pt worked on selfcare retraining sit to stand at EOB.  Min assist needed for initial sit to stand transitions and standing balance.  Regressed to mod assist after two attempts secondary to fatigue.  She was able to transfer stand pivot with mod assist to the bedside recliner with the RW.  Pt fatigues quickly and reports being tired after approximately 20 mins of selfcare tasks sitting EOB.  Decreased ability to reach the LLE for bathing or dressing tasks with increase swelling also noted in the LLE.    OT Evaluation Precautions/Restrictions  Precautions Precautions: Fall Precaution Comments: Pt's femoral cath is tunneled therefore she is able to get OOB. Restrictions Weight Bearing Restrictions: No  Vital Signs Therapy Vitals  Temp: 98.1 F (36.7 C) Temp Source: Oral Pulse Rate: (!) 101 Resp: 20 BP: 128/89 mmHg Patient Position (if appropriate): Lying Oxygen Therapy SpO2: 100 % O2 Device: Not Delivered Pain  Pain in her left knee, no number rating given  Home Living/Prior Functioning Home Living Living Arrangements: Children, Parent Available Help at Discharge: Family, Available 24 hours/day Type of Home: Apartment Home Access:  Level entry, Ramped entrance Home Layout: One level Bathroom Shower/Tub: Tub/shower unit, Architectural technologist: Standard  Lives With: Family IADL History Meal Prep Responsibility: Secondary Laundry Responsibility: Secondary Cleaning Responsibility: Secondary Bill Paying/Finance Responsibility: Secondary Shopping Responsibility: No Current License: No Mode of Transportation: Friends, Cab, Bus Occupation: On disability Prior Function Level of Independence: Requires assistive device for independence (pt reported that she had to rest in sitting during ambulation from bed> LR in apartment)  Able to Take Stairs?: Yes (a couple, with 2 rails, per pt) Driving: No Vocation: On disability Leisure: Hobbies-yes (Comment) Comments: likes crossword puzzles ADL  See Function Section of chart  Vision/Perception  Vision- History Baseline Vision/History: No visual deficits Patient Visual Report: No change from baseline Vision- Assessment Vision Assessment?: Yes Eye Alignment: Within Functional Limits Ocular Range of Motion: Within Functional Limits Alignment/Gaze Preference: Within Defined Limits Tracking/Visual Pursuits: Able to track stimulus in all quads without difficulty Convergence: Within functional limits Visual Fields: No apparent deficits  Cognition Overall Cognitive Status: Within Functional Limits for tasks assessed Arousal/Alertness: Lethargic Orientation Level: Situation;Place;Person Person: Oriented Place: Oriented Situation: Oriented Year: 2016 Month: November Day of Week: Correct Memory: Impaired Memory Impairment: Decreased short term memory Decreased Short Term Memory: Functional basic;Functional complex Immediate Memory Recall: Sock;Blue;Bed Memory Recall: Sock;Blue;Bed Memory Recall Sock: Without Cue Memory Recall Blue: Without Cue Memory Recall Bed: Without Cue Attention: Sustained Sustained Attention: Appears intact Awareness: Impaired Problem Solving:  Impaired Safety/Judgment: Impaired Comments: Pt with decreased recall of instructions for 9 hole peg test.  Pt also reported she felt like she could get up with her walker and without assistance, and be ok. Sensation Sensation Light Touch: Appears Intact Stereognosis: Appears Intact Hot/Cold: Appears Intact Proprioception: Appears Intact Coordination Gross Motor Movements are Fluid and Coordinated: Yes Fine Motor Movements are Fluid and Coordinated: Yes Finger Nose Finger Test: Pt with slower coordination bilaterally with regards to speed with left side being slightly slower than right. Motor  Motor Motor: Abnormal postural alignment and control Mobility  Bed Mobility Bed Mobility: Supine to Sit Supine to Sit: 3: Mod assist Supine to Sit Details: Verbal cues for precautions/safety;Manual facilitation for weight shifting Sit to Supine: 4: Min assist;HOB flat Sit to Supine - Details: Manual facilitation for weight shifting;Manual facilitation for placement Transfers Transfers: Sit to Stand Sit to Stand: 3: Mod assist;Without upper extremity assist;From chair/3-in-1 Sit to Stand Details: Verbal cues for safe use of DME/AE;Manual facilitation for weight shifting Stand to Sit: 3: Mod assist;With upper extremity assist;To chair/3-in-1 Stand to Sit Details (indicate cue type and reason): Manual facilitation for weight shifting;Verbal cues for precautions/safety  Trunk/Postural Assessment  Cervical Assessment Cervical Assessment: Exceptions to [facility] Cervical Strength Overall Cervical Strength Comments: forward cervical flexion and protraction Thoracic Assessment Thoracic Assessment: Exceptions to [facility] (thoracic rounding) Lumbar Assessment Lumbar Assessment: Exceptions to [facility] (Pt sits in posterior pelvic tilt) Postural Control Postural Control: Deficits on evaluation Postural Limitations: Pt sits in a posterior pelvic tilt with increased posterior lean  Balance Static Sitting  Balance Static Sitting - Balance Support: Feet supported Static Sitting - Level of Assistance: 5: Stand by  assistance Dynamic Sitting Balance Dynamic Sitting - Balance Support: Feet supported Dynamic Sitting - Level of Assistance: 4: Min assist Static Standing Balance Static Standing - Balance Support: Right upper extremity supported;Left upper extremity supported Static Standing - Level of Assistance: 4: Min assist Dynamic Standing Balance Dynamic Standing - Balance Support: During functional activity;No upper extremity supported Dynamic Standing - Level of Assistance: 3: Mod assist Extremity/Trunk Assessment RUE Assessment RUE Assessment: Exceptions to [facility] (AROM shoulder flexion 0-130 degrees, shoulder strength 3/5, elbow flexion/extension 3+/5, grip strength 3+/5) LUE Assessment LUE Assessment: Exceptions to [facility] (AROM shoulder flexion 0-130 degrees, strength 3/5.  AROM WFLS for elbow flexion/extension and gross grasp and release.  Strength in all other joints except the shoulder 3+/5 as well. )   See Function Navigator for Current Functional Status.   Refer to Care Plan for Long Term Goals  Recommendations for other services: Neuropsych  Discharge Criteria: Patient will be discharged from OT if patient refuses treatment 3 consecutive times without medical reason, if treatment goals not met, if there is a change in medical status, if patient makes no progress towards goals or if patient is discharged from hospital.  The above assessment, treatment plan, treatment alternatives and goals were discussed and mutually agreed upon: by patient  Magally Vahle OTR/L 02/05/2015, 4:12 PM

## 2015-02-05 NOTE — Progress Notes (Signed)
39 y.o. 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 Received inpatient rehabilitation services 01/07/2015 to 01/17/2015 after right MCA infarct. She was discharged to home ambulating modified independent. Patient lives with her mother and 2 children ages 5 and 68 and used a walker prior to admission. She does not have 24/7 support on discharge. One level home. Presented 01/25/2015 with low-grade fevers and placed on broad-spectrum antibiotics. Culture showed pansensitive Pseudomonas catheter line bloodstream infection. Hospital course bouts of hypotension. Neurology consulted 01/25/2015 with left leg weakness. MRI of the brain showed medial expansion of anterior right MCA territory infarct and acute right basal ganglia infarct. Patient had been on Coumadin which was recently stopped. Coumadin currently on hold with recent TEE showing no evidence of vegetations and reassess after antibiotics completed 02/09/2015 with question to repeat TEE...   Subjective/Complaints:   Objective: Vital Signs: Blood pressure 119/66, pulse 92, temperature 97.6 F (36.4 C), temperature source Oral, resp. rate 19, weight 91.2 kg (201 lb 1 oz), last menstrual period 01/21/2015, SpO2 100 %. No results found. Results for orders placed or performed during the hospital encounter of 02/04/15 (from the past 72 hour(s))  CBC     Status: Abnormal   Collection Time: 02/04/15  7:30 PM  Result Value Ref Range   WBC 11.7 (H) 4.0 - 10.5 K/uL   RBC 4.04 3.87 - 5.11 MIL/uL   Hemoglobin 12.1 12.0 - 15.0 g/dL   HCT 38.1 36.0 - 46.0 %   MCV 94.3 78.0 - 100.0 fL   MCH 30.0 26.0 - 34.0 pg   MCHC 31.8 30.0 - 36.0 g/dL   RDW 19.1 (H) 11.5 - 15.5 %   Platelets 127 (L) 150 - 400 K/uL  Renal function panel     Status: Abnormal   Collection Time: 02/04/15  7:30 PM  Result Value Ref Range    Sodium 136 135 - 145 mmol/L   Potassium 3.7 3.5 - 5.1 mmol/L   Chloride 99 (L) 101 - 111 mmol/L   CO2 27 22 - 32 mmol/L   Glucose, Bld 79 65 - 99 mg/dL   BUN 17 6 - 20 mg/dL   Creatinine, Ser 6.39 (H) 0.44 - 1.00 mg/dL   Calcium 8.6 (L) 8.9 - 10.3 mg/dL   Phosphorus 5.0 (H) 2.5 - 4.6 mg/dL   Albumin 1.8 (L) 3.5 - 5.0 g/dL   GFR calc non Af Amer 7 (L) >60 mL/min   GFR calc Af Amer 9 (L) >60 mL/min    Comment: (NOTE) The eGFR has been calculated using the CKD EPI equation. This calculation has not been validated in all clinical situations. eGFR's persistently <60 mL/min signify possible Chronic Kidney Disease.    Anion gap 10 5 - 15     HEENT: normal Cardio: RRR and no murmur Resp: CTA B/L and unlabored GI: BS positive and NT, ND Extremity:  Pulses positive and No Edema Skin:   Wound Left thigh swollen aroung graft, no drainage from access site, mildly tender Neuro: Alert/Oriented, Normal Sensory and Abnormal Motor 4/5 Left delt, bi, tri, grip, 3- L hip flex, knee xt, ankle DF/PF Musc/Skel:  Swelling Left knee, no jt line tenderness, has L thigh swelling as well Gen NAD   Assessment/Plan: 1. Functional deficits secondary to right basal ganglia infarct with increased LLE weakness and debility from recent bacteremia which require 3+ hours per day of interdisciplinary therapy  in a comprehensive inpatient rehab setting. Physiatrist is providing close team supervision and 24 hour management of active medical problems listed below. Physiatrist and rehab team continue to assess barriers to discharge/monitor patient progress toward functional and medical goals. FIM:                   Function - Comprehension Comprehension: Auditory Comprehension assist level: Follows complex conversation/direction with extra time/assistive device  Function - Expression Expression: Verbal Expression assist level: Expresses complex ideas: With extra time/assistive device  Function -  Social Interaction Social Interaction assist level: Interacts appropriately with others with medication or extra time (anti-anxiety, antidepressant).  Function - Problem Solving Problem solving assist level: Solves basic problems with no assist  Function - Memory Memory assist level: Recognizes or recalls 90% of the time/requires cueing < 10% of the time Patient normally able to recall (first 3 days only): Current season, Location of own room, Staff names and faces, That he or she is in a hospital  Medical Problem List and Plan: 1. Functional deficits secondary to right basal ganglia infarct and debility from recent bacteremia.  Increased weakness noted in LLE ? Related to decondtioning vs new infarct 2.  DVT Prophylaxis/Anticoagulation: Chronic Coumadin on hold until antibiotics completed 02/09/2015 reassess 3. Pain Management: Oxycodone and Flexeril as needed. Monitor with increased mobility, left knee pain, check xray 4. ID/Pseudomonas sepsis. Continue Tressie Ellis 02/09/2015 5. Neuropsych: This patient is capable of making decisions on her own behalf. 6. Skin/Wound Care: Routine skin checks 7. Fluids/Electrolytes/Nutrition: Routine high nose with follow-up chemistries 8. End-stage renal disease/lupus nephritis. Continue hemodialysis as per renal services, uses new Left femoral graft for HD    9. Orthostatic hypotension.Midodrine 10 mg twice a day 10. Chronic anemia. Continue Aranesp 11. Hyperlipidemia. Lipitor   LOS (Days) 1 A FACE TO FACE EVALUATION WAS PERFORMED  Shadeed Colberg E 02/05/2015, 7:24 AM

## 2015-02-05 NOTE — Care Management Note (Signed)
Inpatient [facility] Individual Statement of Services  Patient Name:  [name]  Date:  02/05/2015  Welcome to the [facility].  Our goal is to provide you with an individualized program based on your diagnosis and situation, designed to meet your specific needs.  With this comprehensive rehabilitation program, you will be expected to participate in at least 3 hours of rehabilitation therapies Monday-Friday, with modified therapy programming on the weekends.  Your rehabilitation program will include the following services:  Physical Therapy (PT), Occupational Therapy (OT), Speech Therapy (ST), 24 hour per day rehabilitation nursing, Therapeutic Recreaction (TR), Neuropsychology, Case Management (Social Worker), Rehabilitation Medicine, Nutrition Services and Pharmacy Services  Weekly team conferences will be held on Wednesday to discuss your progress.  Your Social Worker will talk with you frequently to get your input and to update you on team discussions.  Team conferences with you and your family in attendance may also be held.  Expected length of stay: 12-14 days  Overall anticipated outcome: supervision level  Depending on your progress and recovery, your program may change. Your Social Worker will coordinate services and will keep you informed of any changes. Your Social Worker's name and contact numbers are listed  below.  The following services may also be recommended but are not provided by the [facility]:    [place] will be made to provide these services after discharge if needed.  Arrangements include referral to agencies that provide these services.  Your insurance has been verified to be:  medicaid Your primary doctor is:  Lin Landsman  Pertinent information will be shared with your doctor and your insurance company.  Social Worker:  Ovidio Kin, [place] or ([phone]  Information discussed with and copy given to patient by: [name], 02/05/2015, 9:25 AM

## 2015-02-05 NOTE — Progress Notes (Signed)
Occupational Therapy Session Note  Patient Details  Name: [name] MRN: [id number] Date of Birth: January 12, 1976  Today's Date: 02/05/2015 OT Individual Time: 4403-4742 OT Individual Time Calculation (min): 88 min    Skilled Therapeutic Interventions/Progress Updates:  Pt transferred to EOB with min assist and increased time and then completed transfer stand pivot to the wheelchair using the rollator with min assist as well.  Increased trunk flexion noted in standing with each step as well as exaggerated lean to the right to unweight the LLE for advancement.  She was able to practice tub shower transfers using the tub bench like she has at home.  Mod assist needed to complete transfer with max instructional cueing to sequencing scooting over on the bench and bringing LEs over the edge of the tub one by one.  Mod assist needed for lifting the LLE into and out of the tub.  Performed 9 hole peg test as well for further evaluation of coordination once tub bench was completed.  Pt needed multiple attempts to complete, following therapist directions as she continually attempted to remove the pegs with in-hand manipulation instead of one at a time.  Educated pt on cervical exercises and provided handout as well secondary to pt requesting this as she has trouble maintaining neutral cervical extension.  Returned to room via wheelchair at end of session.  Pt transferred back to the bed from the wheelchair with mod assist.  Needed two attempts for sit to stand from the wheelchair.  Min assist for lifting LEs back into the bed.    Therapy Documentation Precautions:  Precautions Precautions: Fall Precaution Comments: Pt's femoral cath is tunneled therefore she is able to get OOB. Restrictions Weight Bearing Restrictions: No  Pain: Pain Assessment Faces Pain Scale: Hurts a little bit Pain Type: Chronic pain Pain Location: Knee Pain Orientation: Left Pain Intervention(s): Repositioned;Emotional  support Multiple Pain Sites: No ADL: See Function Navigator for Current Functional Status.   Therapy/Group: Individual Therapy  Leasia Swann OTR/L 02/05/2015, 4:36 PM

## 2015-02-06 ENCOUNTER — Inpatient Hospital Stay (HOSPITAL_COMMUNITY): Payer: Medicaid Other

## 2015-02-06 ENCOUNTER — Inpatient Hospital Stay (HOSPITAL_COMMUNITY): Payer: Medicaid Other | Admitting: Physical Therapy

## 2015-02-06 ENCOUNTER — Inpatient Hospital Stay (HOSPITAL_COMMUNITY): Payer: Medicaid Other | Admitting: Occupational Therapy

## 2015-02-06 DIAGNOSIS — I6930 Unspecified sequelae of cerebral infarction: Secondary | ICD-10-CM

## 2015-02-06 DIAGNOSIS — M25562 Pain in left knee: Secondary | ICD-10-CM

## 2015-02-06 LAB — URIC ACID: Uric Acid, Serum: 4.9 mg/dL (ref 2.3–6.6)

## 2015-02-06 LAB — CBC WITH DIFFERENTIAL/PLATELET
Basophils Absolute: 0.1 10*3/uL (ref 0.0–0.1)
Basophils Relative: 1 %
Eosinophils Absolute: 0 10*3/uL (ref 0.0–0.7)
Eosinophils Relative: 0 %
HEMATOCRIT: 29.3 % — AB (ref 36.0–46.0)
HEMOGLOBIN: 9.3 g/dL — AB (ref 12.0–15.0)
LYMPHS ABS: 0.5 10*3/uL — AB (ref 0.7–4.0)
Lymphocytes Relative: 6 %
MCH: 30.1 pg (ref 26.0–34.0)
MCHC: 31.7 g/dL (ref 30.0–36.0)
MCV: 94.8 fL (ref 78.0–100.0)
MONOS PCT: 4 %
Monocytes Absolute: 0.3 10*3/uL (ref 0.1–1.0)
NEUTROS ABS: 6.8 10*3/uL (ref 1.7–7.7)
NEUTROS PCT: 89 %
Platelets: 100 10*3/uL — ABNORMAL LOW (ref 150–400)
RBC: 3.09 MIL/uL — ABNORMAL LOW (ref 3.87–5.11)
RDW: 19.3 % — ABNORMAL HIGH (ref 11.5–15.5)
WBC: 7.7 10*3/uL (ref 4.0–10.5)

## 2015-02-06 LAB — SEDIMENTATION RATE: SED RATE: 4 mm/h (ref 0–22)

## 2015-02-06 LAB — C-REACTIVE PROTEIN: CRP: 1.1 mg/dL — ABNORMAL HIGH (ref <1.0)

## 2015-02-06 MED ORDER — SEVELAMER CARBONATE 800 MG PO TABS
1600.0000 mg | ORAL_TABLET | Freq: Three times a day (TID) | ORAL | Status: DC
Start: 2015-02-06 — End: 2015-02-13
  Administered 2015-02-06 – 2015-02-13 (×16): 1600 mg via ORAL
  Filled 2015-02-06 (×21): qty 2

## 2015-02-06 NOTE — Progress Notes (Signed)
Subjective/Complaints: No sig arthritic changes in Left knee, pain kept her awake last noc ROS:  No sweats chills, no cough, no other jt pains Objective: Vital Signs: Blood pressure 119/65, pulse 92, temperature 98.5 F (36.9 C), temperature source Oral, resp. rate 20, weight 97.5 kg (214 lb 15.2 oz), last menstrual period 01/21/2015, SpO2 100 %. Dg Knee 1-2 Views Left  02/06/2015  CLINICAL DATA:  Left knee pain and swelling for weeks. EXAM: LEFT KNEE - 1-2 VIEW COMPARISON:  None. FINDINGS: No fracture or dislocation. The alignment and joint spaces are maintained. Tiny inferior patellar osteophytes. No large joint effusion. Mild subcutaneous edema seen of the knee and lower leg. IMPRESSION: Diffuse soft tissue edema.  Tiny patellar osteophytes. Electronically Signed   By: Jeb Levering M.D.   On: 02/06/2015 00:10   Results for orders placed or performed during the hospital encounter of 02/04/15 (from the past 72 hour(s))  CBC     Status: Abnormal   Collection Time: 02/04/15  7:30 PM  Result Value Ref Range   WBC 11.7 (H) 4.0 - 10.5 K/uL   RBC 4.04 3.87 - 5.11 MIL/uL   Hemoglobin 12.1 12.0 - 15.0 g/dL   HCT 38.1 36.0 - 46.0 %   MCV 94.3 78.0 - 100.0 fL   MCH 30.0 26.0 - 34.0 pg   MCHC 31.8 30.0 - 36.0 g/dL   RDW 19.1 (H) 11.5 - 15.5 %   Platelets 127 (L) 150 - 400 K/uL  Renal function panel     Status: Abnormal   Collection Time: 02/04/15  7:30 PM  Result Value Ref Range   Sodium 136 135 - 145 mmol/L   Potassium 3.7 3.5 - 5.1 mmol/L   Chloride 99 (L) 101 - 111 mmol/L   CO2 27 22 - 32 mmol/L   Glucose, Bld 79 65 - 99 mg/dL   BUN 17 6 - 20 mg/dL   Creatinine, Ser 6.39 (H) 0.44 - 1.00 mg/dL   Calcium 8.6 (L) 8.9 - 10.3 mg/dL   Phosphorus 5.0 (H) 2.5 - 4.6 mg/dL   Albumin 1.8 (L) 3.5 - 5.0 g/dL   GFR calc non Af Amer 7 (L) >60 mL/min   GFR calc Af Amer 9 (L) >60 mL/min    Comment: (NOTE) The eGFR has been calculated using the CKD EPI equation. This calculation has not been  validated in all clinical situations. eGFR's persistently <60 mL/min signify possible Chronic Kidney Disease.    Anion gap 10 5 - 15     HEENT: normal Cardio: RRR and no murmur Resp: CTA B/L and unlabored GI: BS positive and NT, ND Extremity:  Pulses positive and No Edema Skin:   Wound Left thigh swollen aroung graft, no drainage from access site, mildly tender Neuro: Alert/Oriented, Normal Sensory and Abnormal Motor 4/5 Left delt, bi, tri, grip, 3- L hip flex, knee xt, ankle DF/PF Musc/Skel:  Swelling Left knee, no jt line tenderness, has L thigh swelling as well Gen NAD   Assessment/Plan: 1. Functional deficits secondary to right basal ganglia infarct with increased LLE weakness and debility from recent bacteremia which require 3+ hours per day of interdisciplinary therapy in a comprehensive inpatient rehab setting. Physiatrist is providing close team supervision and 24 hour management of active medical problems listed below. Physiatrist and rehab team continue to assess barriers to discharge/monitor patient progress toward functional and medical goals. FIM: Function - Bathing Position: Sitting EOB Body parts bathed by patient: Right arm, Left arm, Chest, Abdomen, Right  upper leg, Left upper leg Body parts bathed by helper: Back, Front perineal area, Buttocks Bathing not applicable: Left lower leg, Right lower leg  Function- Upper Body Dressing/Undressing What is the patient wearing?: Hospital gown Set up : To obtain clothing/put away Function - Lower Body Dressing/Undressing What is the patient wearing?: Underwear, Non-skid slipper socks Underwear - Performed by helper: Thread/unthread right underwear leg, Thread/unthread left underwear leg, Pull underwear up/down Non-skid slipper socks- Performed by helper: Don/doff right sock, Don/doff left sock        Function - Chair/bed transfer Chair/bed transfer method: Ambulatory Chair/bed transfer assist level: Moderate assist  (Pt 50 - 74%/lift or lower) Chair/bed transfer details: Verbal cues for precautions/safety, Verbal cues for safe use of DME/AE  Function - Locomotion: Wheelchair Will patient use wheelchair at discharge?: Yes Type: Manual Max wheelchair distance: 30 Assist Level: Supervision or verbal cues Turns around,maneuvers to table,bed, and toilet,negotiates 3% grade,maneuvers on rugs and over doorsills: No Function - Locomotion: Ambulation Max distance: 6 Assist level: 2 helpers  Function - Comprehension Comprehension: Auditory Comprehension assist level: Follows basic conversation/direction with extra time/assistive device  Function - Expression Expression: Verbal Expression assist level: Expresses complex 90% of the time/cues < 10% of the time  Function - Social Interaction Social Interaction assist level: Interacts appropriately with others with medication or extra time (anti-anxiety, antidepressant).  Function - Problem Solving Problem solving assist level: Solves basic 90% of the time/requires cueing < 10% of the time  Function - Memory Memory assist level: Recognizes or recalls 75 - 89% of the time/requires cueing 10 - 24% of the time Patient normally able to recall (first 3 days only): Current season, Staff names and faces, That he or she is in a hospital  Medical Problem List and Plan: 1. Functional deficits secondary to right basal ganglia infarct and debility from recent bacteremia.  Increased weakness noted in LLE ? Related to decondtioning vs new infarct 2.  DVT Prophylaxis/Anticoagulation: Chronic Coumadin on hold until antibiotics completed 02/09/2015 reassess 3. Pain Management: Oxycodone and Flexeril as needed. Monitor with increased mobility, left knee pain, - xray, some erythema, WBC on 11/7 11.7 up from 7 on 11/2, has pseudomonas sepsis on fortaz ask ortho to eval, likely needs arthrocentesis 4. ID/Pseudomonas sepsis. Continue Tressie Ellis 02/09/2015 5. Neuropsych: This patient  is capable of making decisions on her own behalf. 6. Skin/Wound Care: Routine skin checks 7. Fluids/Electrolytes/Nutrition: Routine I/Os with follow-up chemistries 8. End-stage renal disease/lupus nephritis. Continue hemodialysis as per renal services, uses new Left femoral graft for HD    9. Orthostatic hypotension.Midodrine 10 mg twice a day 10. Chronic anemia. Continue Aranesp 11. Hyperlipidemia. Lipitor   LOS (Days) 2 A FACE TO FACE EVALUATION WAS PERFORMED  [name] E 02/06/2015, 7:06 AM

## 2015-02-06 NOTE — Progress Notes (Signed)
Physical Therapy Session Note  Patient Details  Name: [name] MRN: [id number] Date of Birth: 10-07-1975  Today's Date: 02/06/2015 PT Individual Time: 0900-0930; 1430-1530 PT Individual Time Calculation (min): 30 min , 60 min  Short Term Goals: Week 1:  PT Short Term Goal 1 (Week 1): pt will move supine> sitting with min assist PT Short Term Goal 2 (Week 1): pt will move stand> sit with min assist  PT Short Term Goal 3 (Week 1): pt will propel w/c x 50' with superviison PT Short Term Goal 4 (Week 1): pt will perform gait x 20' with assist of 1 person and LRAD PT Short Term Goal 5 (Week 1): pt will tolerate static stander x 15 minutes  Skilled Therapeutic Interventions/Progress Updates:  Tx 1: pt having heavy menstrual period, with cramps.  She was reluctant to get OOB, but agreed to do bedside tx and assist with having her pad and diaper changed. Pt performed 10 x 1 L heel slides, bil hip internal rotation with isometric hold, bil ankle pumps.  Instructed pt in counting aloud to avoid Valsalva maneuver during exertion.  Pt rolled repeatedly L><R without use of rails for hygiene change and replacement of feminine hygiene pad and diaper.  She assisted with cleansing in supine.  Pt left resting in bed with bed alarm on and all needs within reach.  Tx 2: pt declined getting OOB due to heavy period.  She agreed to bedside tx.  neuromuscular re-education via demo, VCS, manual cues, forced use for activities below. Bed mobility for hygiene change as above.  Pt able to bridge sufficiently for removal of diaper.    Pt did not remember meeting this PT yesterday for evaluation.  She has some insight into cognitive changes with CVA, identifying that she is slow to answer questions.    Pt left supine in bed HOB raised with bed alarm on and all needs within reach.    Therapy Documentation Precautions:  Precautions Precautions: Fall Precaution Comments: Pt's femoral cath is tunneled therefore  she is able to get OOB; L knee joint pain Restrictions Weight Bearing Restrictions: No   Pain: Pain Assessment Pain Assessment: 0-10 Pain Score: 5  Pain Type: Acute pain Pain Location: Knee Pain Orientation: Left Pain Descriptors / Indicators: Aching Pain Frequency: Intermittent Pain Onset: Sudden Patients Stated Pain Goal: 1 Pain Intervention(s): Medication (See eMAR) Multiple Pain Sites: No   Other Treatments: Treatments Therapeutic Activity: supine exs: bil hip internal rotaion, R/L heel slide;, assisted L straight leg raise x 10 rach; R/L ankle pumps x 20 each; R/L ankle "alphabet" A-Z; bil bridging 10 x 2  Neuromuscular Facilitation: Lower Extremity;Left;Activity to increase sustained activation;Activity to increase grading;Activity to increase timing and sequencing;Activity to increase motor control;Activity to increase coordination   See Function Navigator for Current Functional Status.   Therapy/Group: Individual Therapy  Shepard Keltz 02/06/2015, 4:04 PM

## 2015-02-06 NOTE — Patient Care Conference (Signed)
Inpatient RehabilitationTeam Conference and Plan of Care Update Date: 02/06/2015   Time: 10:45 AM    Patient Name: [name]      Medical Record Number: [id number]  Date of Birth: 1976/01/25 Sex: Female         Room/Bed: 4M05C/4M05C-01 Payor Info: Payor: MEDICAID [place] / Plan: MEDICAID [place] ACCESS / Product Type: *No Product type* /    Admitting Diagnosis: Debility  Admit Date/Time:  02/04/2015  5:50 PM Admission Comments: No comment available   Primary Diagnosis:  <principal problem not specified> Principal Problem: <principal problem not specified>  Patient Active Problem List   Diagnosis Date Noted  . Left lateral knee pain 02/07/2015  . History of vascular access device   . End-stage renal disease on hemodialysis ([place])   . Pseudomonas sepsis ([place])   . Fever, unspecified 01/25/2015  . Sepsis ([place]) 01/25/2015  . Adjustment disorder with depressed mood   . Post-operative pain 01/10/2015  . Left hemiparesis ([place])   . 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 Anemia due to bone marrow failure ([place])   . 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 Other pancytopenia ([place])   . Pressure ulcer 12/21/2014  . 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 ESRD (end stage renal disease) on dialysis ([place])   . UTI (lower urinary tract infection)   . Retroperitoneal hematoma   . Lupus nephritis ([place])   . Hypertension 07/20/2012  . Weight loss, unintentional 07/20/2012    Expected Discharge Date: Expected Discharge Date: 02/19/15  Team Members Present: Physician leading conference: Dr. Alysia Chen Social Worker Present: Eileen Kin, LCSW Nurse Present: Eileen Roberts, RN PT Present: [name], PT OT Present: [name], OT SLP Present: [name], SLP PPS Coordinator present : Eileen Nakayama, RN, CRRN     Current Status/Progress Goal Weekly Team Focus  Medical   Left knee pain, left thigh swelling, hemodialysis patient using new left  femoral AV graft  Home with family assistance once medically stable  Orthopedic evaluation of left knee pain   Bowel/Bladder   Continent of bowel; LBM 02/06/15. Anuric, HD TTS  Pt to remain continent of bowel  Continue to monitor for s/s cof constipation   Swallow/Nutrition/ Hydration     na        ADL's   supervision for UB selfcare tasks, mod assist for LB selfcare, mod assist for functional transfers.  Decreased endurance overall, flat affect  supervision for transfers and selfcare tasks, modified for grooming and UB selfcare   selfcare re-training, functional endurance, UB strengthening, balance re-education, transfer training, pt/family education   Mobility   as of 02/05/15 mod assist transfers, gait +2 x 6', w/ propulsion supervision x 30'  supervision overall x 75' gait  strengthening, OOB duration, activity tolerance, pre-gait/gait, pt ed   Communication     na        Safety/Cognition/ Behavioral Observations    no unsafe behavior        Pain   1 percocet q 6hr prn, L thigh and knee pain  <3  assess pain q 4hr and prn   Skin   L thigh graft, R thigh HD catheter removed, covered with gauze.  no additional skin breakdown while on rehab  assess skin q shift and prn      *See Care Plan and progress notes for long and short-term goals.  Barriers to Discharge: Has been deconditioned even prior to most recent episode of sepsis  Possible Resolutions to Barriers:  Medical management, rehabilitation, question home versus SNF post discharge    Discharge Planning/Teaching Needs:  Home with Mom who can assist and was prior to admission this time.      Team Discussion:  New eval-goals supervision over-all level. L-knee pain checking to see if septic-ortho to see today. On IV antibiotics currently. Neuro-psych to see tomorrow. Flat affect  Revisions to Treatment Plan:  New eval   Continued Need for Acute Rehabilitation Level of Care: The patient requires daily medical management by  a physician with specialized training in physical medicine and rehabilitation for the following conditions: Daily direction of a multidisciplinary physical rehabilitation program to ensure safe treatment while eliciting the highest outcome that is of practical value to the patient.: Yes Daily medical management of patient stability for increased activity during participation in an intensive rehabilitation regime.: Yes Daily analysis of laboratory values and/or radiology reports with any subsequent need for medication adjustment of medical intervention for : Other;Post surgical problems;Neurological problems  [name] 02/07/2015, 8:39 AM

## 2015-02-06 NOTE — Progress Notes (Signed)
Physical Therapy Session Note  Patient Details  Name: [name] MRN: [id number] Date of Birth: [birth year]  Today's Date: 02/06/2015 PT Individual Time: 1130-1205 PT Individual Time Calculation (min): 35 min   Short Term Goals: Week 1:  PT Short Term Goal 1 (Week 1): pt will move supine> sitting with min assist PT Short Term Goal 2 (Week 1): pt will move stand> sit with min assist  PT Short Term Goal 3 (Week 1): pt will propel w/c x 50' with superviison PT Short Term Goal 4 (Week 1): pt will perform gait x 20' with assist of 1 person and LRAD PT Short Term Goal 5 (Week 1): pt will tolerate static stander x 15 minutes  Skilled Therapeutic Interventions/Progress Updates:    Pt received supine in bed, c/o pain in L knee as below and agreeable to treatment. Pt reports she needs to change brief before getting up due to heavy menstrual cycle. Performed repetitive rolling R/L in bed with bedrails and minA, requires totalA for perineal hygiene and donning/doffing brief. L sidelying to sit with steady A, bedrails and HOB elevated. Performed sit >stand from EOB with modA and bed slightly elevated for mechanical advantage due to c/o knee pain. Stand pivot transfer with rollator and minA. Required cues for eccentric control for stand >sit. Pt remained seated in w/c with elevating leg rests, and all needs within reach at completion of session.   Therapy Documentation Precautions:  Precautions Precautions: Fall Precaution Comments: Pt's femoral cath is tunneled therefore she is able to get OOB. Restrictions Weight Bearing Restrictions: No Pain: Pain Assessment Pain Assessment: 0-10 Pain Score: 8  Pain Type: Acute pain Pain Location: Knee Pain Orientation: Left Pain Descriptors / Indicators: Aching Pain Onset: On-going Patients Stated Pain Goal: 0 Pain Intervention(s): Repositioned Multiple Pain Sites: No   See Function Navigator for Current Functional Status.   Therapy/Group:  Individual Therapy  Luberta Mutter 02/06/2015, 12:24 PM

## 2015-02-06 NOTE — Progress Notes (Signed)
Patient ID: [name], female   DOB: [birth year], 39 y.o.   MRN: [id number]  [facility] Progress Note   Assessment/ Plan:   1. Pseudomonas sepsis: Suspected to be catheter related bacteremia prompting discontinuation of dialysis catheter and initiation of use of her left thigh graft. Remains on Fortaz until 11/12 2. ESRD: On TTS HD schedule- next HD tomorrow. Likely with left thigh swelling associated with AVG- however, no evidence of infection. 3. Anemia of chronic kidney disease: Stable hemoglobin, continue ESA. No overt losses noted. 4. Metabolic bone disease: Phosphorus levels noted-will start renvela TIDAC. Not on vitamin D receptor analogues. 5. Hypotension: Continue midodrine for blood pressure support and ultrafiltration for volume management. 6. Malnutrition/deconditioning: Continue inpatient rehabilitation with dietary nutritional supplementation  Subjective:   Complains of left thigh swelling and pain- having difficulty with PT/OT    Objective:   BP 119/65 mmHg  Pulse 92  Temp(Src) 98.5 F (36.9 C) (Oral)  Resp 20  Wt 97.5 kg (214 lb 15.2 oz)  SpO2 100%  LMP 01/21/2015  Physical Exam: Gen: Resting uncomfortably in bed CVS: Pulse regular in rate and rhythm, S1 and S2 normal Resp: Clear to auscultation, no rales Abd: Soft, obese, nontender Ext: Some edema over left thigh AVG. No pretibial/ankle edema   Labs: BMET  Recent Labs Lab 01/31/15 0357 02/01/15 0353 02/02/15 0459 02/03/15 0302 02/04/15 0415 02/04/15 1930  NA 137 138 135 137 136 136  K 3.4* 3.5 3.3* 3.9 3.5 3.7  CL 103 101 99* 100* 102 99*  CO2 27 27 27 30 28 27   GLUCOSE 67 59* 74 79 75 79  BUN 20 13 19 9 14 17   CREATININE 6.06* 4.94* 6.20* 4.19* 5.74* 6.39*  CALCIUM 7.8* 8.0* 8.3* 8.3* 8.3* 8.6*  PHOS 2.6 2.7 3.9 3.3 4.4 5.0*   CBC  Recent Labs Lab 01/31/15 0357 02/01/15 0353 02/04/15 1930  WBC 4.9 7.2 11.7*  HGB 9.3* 10.5* 12.1  HCT 28.8* 32.5* 38.1  MCV 91.4 91.3  94.3  PLT 104* 88* 127*   Medications:    . atorvastatin  20 mg Oral q1800  . cefTAZidime (FORTAZ)  IV  2 g Intravenous Q T,Th,Sa-HD  . [START ON 02/07/2015] darbepoetin (ARANESP) injection - DIALYSIS  200 mcg Intravenous Q Thu-HD  . feeding supplement (PRO-STAT SUGAR FREE 64)  30 mL Oral BID  . hydroxychloroquine  400 mg Oral Daily  . metoprolol succinate  25 mg Oral QHS  . midodrine  10 mg Oral BID WC  . multivitamin  1 tablet Oral QHS  . pantoprazole  40 mg Oral Daily   Elmarie Shiley, MD 02/06/2015, 10:11 AM

## 2015-02-06 NOTE — Progress Notes (Signed)
Social Work Patient ID: [name], female   DOB: Aug 15, 1975, 39 y.o.   MRN: [id number] Met with pt to inform team conference goals-supervision level and discharge 11/22.  She report the ortho MD came and tried to draw fluid off her knee but there was not any. They will  Ultra sound it next to see if any infection there. She hopes there is no infection in her joint. Will talk with Mom tomorrow and work on discharge plans. Pt seems brighter today than yesterday.

## 2015-02-06 NOTE — Progress Notes (Signed)
Physical Therapy Session Note  Patient Details  Name: [name] MRN: [id number] Date of Birth: 06-17-1975  Today's Date: 02/06/2015 PT Individual Time: 1359-1411 PT Individual Time Calculation (min): 12 min  and Today's Date: 02/06/2015 PT Missed Time: 18 Minutes Missed Time Reason: Pain  Short Term Goals: Week 1:  PT Short Term Goal 1 (Week 1): pt will move supine> sitting with min assist PT Short Term Goal 2 (Week 1): pt will move stand> sit with min assist  PT Short Term Goal 3 (Week 1): pt will propel w/c x 50' with superviison PT Short Term Goal 4 (Week 1): pt will perform gait x 20' with assist of 1 person and LRAD PT Short Term Goal 5 (Week 1): pt will tolerate static stander x 15 minutes  Skilled Therapeutic Interventions/Progress Updates:   Pt received supine in bed and refusing therapy 2/2 pain in L knee.  Pt reports pain is currently a 5/10 but was only just getting under control following bedside therapy with another PT from 1430-1530.  PT provided education on therapy goals, plan of care, and answered pt's questions about conference.  Pt left supine at end of session with call bell in reach and needs met.    Therapy Documentation Precautions:  Precautions Precautions: Fall Precaution Comments: Pt's femoral cath is tunneled therefore she is able to get OOB; L knee joint pain Restrictions Weight Bearing Restrictions: No General: PT Amount of Missed Time (min): 18 Minutes PT Missed Treatment Reason: Pain Pain: Pt c/o 5/10 pain in L knee following aspiration attempt earlier this afternoon.   See Function Navigator for Current Functional Status.   Therapy/Group: Individual Therapy  [name] 02/06/2015, 4:53 PM

## 2015-02-06 NOTE — Consult Note (Signed)
ORTHOPAEDIC CONSULTATION  REQUESTING PHYSICIAN: Charlett Blake, MD  Chief Complaint: L knee pain  HPI: [name] is a 39 y.o. female who complains of L knee pain for a few months, increasing over the past couple of days.  She denies previous injury or similar pain in the past.  Patient has ESRD and has her dialysis catheter in the L thigh.  Nephrology has noted swelling in the patient's L leg due to the catheter.  No limits in ROM of L knee, just pain full. Patient able to ambulate with only mild discomfort.  Minimal degenerative changes noted on xray.  WBC and sed rate improving.    Past Medical History  Diagnosis Date  . Hypertension   . Cardiac arrhythmia   . SVT (supraventricular tachycardia) ([place])     "today, last week, 2 wk ago; maybe 1 month ago, etc; started w/in last 3-4 yrs"(07/20/2012)  . Fainting     "~ 1 month ago; probably related to SVT" (07/20/2012)  . Shortness of breath     "related to SVT episodes" (07/20/2012)  . Chest pain at rest     "related to SVT" (07/20/2012)  . Lupus (systemic lupus erythematosus) ([place])   . GERD (gastroesophageal reflux disease)   . Fibromyalgia   . Irritable bowel syndrome (IBS)   . Hypercholesterolemia   . Heart murmur     "small" (12/25/2014)  . TIA (transient ischemic attack) 12/21/2014  . Sleep apnea     "don't wear mask anymore" (12/25/2014)  . Anemia   . History of blood transfusion "several"    "related to low counts"  . Daily headache   . Arthritis     "hips" (12/25/2014)  . Anxiety     "sometimes; don't take anything for it" (12/25/2014)  . 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  Repeat biopsy early 2016 membranous w/o activity so meds weaned off. Big flare Aug '16 creat 6, 3rd biopsy DPGN, meds resumed. Ended up starting HD Sept 2016  . Stroke ([place])   . History of vascular access device     2016: Sept 9  R IJ  cath per IR.  Sept 20 not candidate for fistula due to disease veins, had LUA hybrid graft placed. Sept 21 - steal syndrome, LUA AVG ligated. Oct 7 - new left femoral Gore-tex loop graft per [facility]. Oct 29 - removal R IJ tunneled HD cath   Past Surgical History  Procedure Laterality Date  . Cesarean section  2001; 2010  . Cervical biopsy  2013  . Supraventricular tachycardia ablation  07/21/12     Dr. Cristopher Peru   . Peripheral vascular catheterization N/A 12/14/2014    Procedure: Upper Extremity Venography;  Surgeon: Elam Dutch, MD;  Location: [facility];  Service: Cardiovascular;  Laterality: N/A;  . Insertion hybrid anteriovenous gortex graft Left 12/18/2014    Procedure: INSERTION GORE HYBRID ARTERIOVENOUS GRAFT LEFT AXILLO-BRACHIAL.;  Surgeon: Serafina Mitchell, MD;  Location[facility];  Service: Vascular;  Laterality: Left;  . Ligation of arteriovenous  fistula Left 12/19/2014    Procedure: LIGATION OF FISTULA;  Surgeon: Elam Dutch, MD;  Location: [place];  Service: Vascular;  Laterality: Left;  . Appendectomy  2011  . Tubal ligation  2010  . Tee without cardioversion N/A 12/25/2014    Procedure: TRANSESOPHAGEAL ECHOCARDIOGRAM (TEE);  Surgeon: Eber Hong  Radford Pax, MD;  Location: [place];  Service: Cardiovascular;  Laterality: N/A;  . Av fistula placement Left 01/04/2015    Procedure: INSERTION OF ARTERIOVENOUS (AV) GORE-TEX GRAFT THIGH;  Surgeon: Rosetta Posner, MD;  Location: [place];  Service: Vascular;  Laterality: Left;  . Tee without cardioversion N/A 01/30/2015    Procedure: TRANSESOPHAGEAL ECHOCARDIOGRAM (TEE);  Surgeon: Lelon Perla, MD;  Location: [facility] ENDOSCOPY;  Service: Cardiovascular;  Laterality: N/A;   Social History   Social History  . Marital Status: Single    Spouse Name: N/A  . Number of Children: N/A  . Years of Education: N/A   Social History Main Topics  . Smoking status: Never Smoker   . Smokeless tobacco: Never Used  . Alcohol Use: No     Comment:  07/20/2012 "might have a beer once/yr"  . Drug Use: No  . Sexual Activity: Not Currently    Birth Control/ Protection: None   Other Topics Concern  . Not on file   Social History Narrative   Family History  Problem Relation Age of Onset  . Cancer Mother     breast and ovarian  . BRCA 1/2 Sister    Allergies  Allergen Reactions  . Food Swelling    Red peppers   Prior to Admission medications   Medication Sig Start Date End Date Taking? Authorizing Provider  atorvastatin (LIPITOR) 20 MG tablet Take 1 tablet (20 mg total) by mouth daily at 6 PM. 01/17/15   Lavon Paganini Angiulli, PA-C  Calcium Carbonate Antacid (TUMS PO) Take 1-2 tablets by mouth daily as needed (heartburn).    Historical Provider, MD  cefTAZidime 2 g in dextrose 5 % 50 mL Inject 2 g into the vein Every Tuesday,Thursday,and Saturday with dialysis. 02/04/15   Collier Salina, MD  cyclobenzaprine (FLEXERIL) 10 MG tablet Take 1 tablet (10 mg total) by mouth at bedtime as needed for muscle spasms. 01/17/15   Lavon Paganini Angiulli, PA-C  Darbepoetin Alfa (ARANESP) 200 MCG/0.4ML SOSY injection Inject 0.4 mLs (200 mcg total) into the vein every Thursday with hemodialysis. 02/04/15   Collier Salina, MD  hydroxychloroquine (PLAQUENIL) 200 MG tablet Take 2 tablets (400 mg total) by mouth daily. 01/17/15   Lavon Paganini Angiulli, PA-C  metoprolol succinate (TOPROL-XL) 25 MG 24 hr tablet Take 1 tablet (25 mg total) by mouth at bedtime. 01/17/15   Lavon Paganini Angiulli, PA-C  midodrine (PROAMATINE) 10 MG tablet Take 1 tablet (10 mg total) by mouth 2 (two) times daily with a meal. 02/04/15   Collier Salina, MD  multivitamin (RENA-VIT) TABS tablet Take 1 tablet by mouth at bedtime. 01/17/15   Lavon Paganini Angiulli, PA-C  oxyCODONE-acetaminophen (PERCOCET) 5-325 MG tablet Take 1 tablet by mouth every 6 (six) hours as needed for severe pain. 01/17/15   Lavon Paganini Angiulli, PA-C  pantoprazole (PROTONIX) 20 MG tablet Take 1 tablet (20 mg total) by mouth  daily. 01/17/15   Lavon Paganini Angiulli, PA-C  polyethylene glycol (MIRALAX / GLYCOLAX) packet Take 17 g by mouth daily as needed for mild constipation. 02/04/15   Collier Salina, MD  trimethobenzamide (TIGAN) 300 MG capsule Take 1 capsule (300 mg total) by mouth every 8 (eight) hours as needed for nausea/vomiting. 02/04/15   Collier Salina, MD   Dg Knee 1-2 Views Left  02/06/2015  CLINICAL DATA:  Left knee pain and swelling for weeks. EXAM: LEFT KNEE - 1-2 VIEW COMPARISON:  None. FINDINGS: No fracture or dislocation. The alignment  and joint spaces are maintained. Tiny inferior patellar osteophytes. No large joint effusion. Mild subcutaneous edema seen of the knee and lower leg. IMPRESSION: Diffuse soft tissue edema.  Tiny patellar osteophytes. Electronically Signed   By: Jeb Levering M.D.   On: 02/06/2015 00:10    Positive ROS: All other systems have been reviewed and were otherwise negative with the exception of those mentioned in the HPI and as above.  Labs cbc  Recent Labs  02/04/15 1930 02/06/15 1051  WBC 11.7* 7.7  HGB 12.1 9.3*  HCT 38.1 29.3*  PLT 127* 100*    Labs inflam  Recent Labs  02/06/15 1051  CRP 1.1*    Labs coag No results for input(s): INR, PTT in the last 72 hours.  Invalid input(s): PT   Recent Labs  02/04/15 0415 02/04/15 1930  NA 136 136  K 3.5 3.7  CL 102 99*  CO2 28 27  GLUCOSE 75 79  BUN 14 17  CREATININE 5.74* 6.39*  CALCIUM 8.3* 8.6*    Physical Exam: Filed Vitals:   02/06/15 0617  BP: 119/65  Pulse: 92  Temp: 98.5 F (36.9 C)  Resp: 20   General: Alert, no acute distress Cardiovascular: No pedal edema Respiratory: No cyanosis, no use of accessory musculature GI: No organomegaly, abdomen is soft and non-tender Skin: Multiple lesions to the L thigh due to dialysis catheter.  Neurologic: Sensation intact distally Psychiatric: Patient is competent for consent with normal mood and affect Lymphatic: No axillary or cervical  lymphadenopathy  MUSCULOSKELETAL:   L knee is mildly swollen.  No tenderness over the joint lines.  Full ROM of the knee, but mild discomfort.  Sensation intact with 2+ distal pulses.  Other extremities are atraumatic with painless ROM and NVI.  Assessment: L knee swelling and pain  Plan: Attempted aspiration of the L knee.  Dry tap.  Labs improved today.  Septic joint unlikely.  Some swelling likely resulting from dialysis catheter to the L thigh.  If pain continues to be an issue and WBC elevates, would recommend VIR aspiration. Only mild degenerative changes noted on imaging. WBAT in the L knee at this time.  Please call with any questions or concerns.    02/06/2015  1:32 PM  PATIENT:  [name]    PRE-PROCEDURE DIAGNOSIS:  L knee pain  POST-OPERATIVE DIAGNOSIS:  Same  PROCEDURE:  Intra-articular aspiration right knee  PROCEDURE DETAILS:  After informed verbal consent was obtained the superolateral portal was prepped with alcohol and the knee was aspirated, yielding a total of 0 ml . She tolerated this well and a Band-Aid was placed.  Due to lack of aspiration fluid no culture was sent.    Gae Dry, PA-C Cell [phone]   02/06/2015 1:21 PM

## 2015-02-06 NOTE — Progress Notes (Signed)
Occupational Therapy Session Note  Patient Details  Name: [name] MRN: [id number] Date of Birth: Aug 21, 1975  Today's Date: 02/06/2015 OT Individual Time: 1300-1330 OT Individual Time Calculation (min): 30 min  and Today's Date: 02/06/2015 OT Missed Time: 30 Minutes Missed Time Reason: Pain   Short Term Goals: Week 1:  OT Short Term Goal 1 (Week 1): Pt will perform LB bathing sit to stand with min assist and AE.  OT Short Term Goal 2 (Week 1): Pt will perform LB dressing sit to stand with min assist and AE PRN. OT Short Term Goal 3 (Week 1): Pt will perform toilet transfers with min assist 3 consecutive sessions. OT Short Term Goal 4 (Week 1): Pt will perform walk-in shower transfer with min assist using the RW.  OT Short Term Goal 5 (Week 1): Pt will perform BUE light resistance exercise with supervision following written exercise plan.    Skilled Therapeutic Interventions/Progress Updates:  Upon entering the room, pt supine in bed with orthopedic staff member finishing up from attempt to draw fluid from pt's L knee. Pt upset and crying with reports of 10/10 pain this session. RN notified.  Pt refusing to participate in therapy secondary to increased pain from procedure. OT educated pt on OT purpose, POC, goals, as well as discharge recommendations. Pt asking questions regarding expectations for patients in inpatient rehabilitation. All questions answered and pt continues to refuse to participate further. Call bell and all needed items left within reach upon exiting the room.   Therapy Documentation Precautions:  Precautions Precautions: Fall Precaution Comments: Pt's femoral cath is tunneled therefore she is able to get OOB. Restrictions Weight Bearing Restrictions: No General: General OT Amount of Missed Time: 30 Minutes Vital Signs:   Pain: Pain Assessment Pain Assessment: 0-10 Pain Score: 10 Pain Type: Acute pain Pain Location: Knee Pain Orientation: Left Pain  Descriptors / Indicators: Aching Pain Onset: On-going Patients Stated Pain Goal: 0 Pain Intervention(s): Repositioned Multiple Pain Sites: No  See Function Navigator for Current Functional Status.   Therapy/Group: Individual Therapy  Phineas Semen 02/06/2015, 1:38 PM

## 2015-02-07 ENCOUNTER — Inpatient Hospital Stay (HOSPITAL_COMMUNITY): Payer: Medicaid Other | Admitting: Occupational Therapy

## 2015-02-07 ENCOUNTER — Inpatient Hospital Stay (HOSPITAL_COMMUNITY): Payer: Medicaid Other

## 2015-02-07 ENCOUNTER — Encounter (HOSPITAL_COMMUNITY): Payer: Medicaid Other

## 2015-02-07 DIAGNOSIS — M25562 Pain in left knee: Secondary | ICD-10-CM | POA: Diagnosis not present

## 2015-02-07 LAB — RENAL FUNCTION PANEL
ANION GAP: 9 (ref 5–15)
Albumin: 1.9 g/dL — ABNORMAL LOW (ref 3.5–5.0)
BUN: 12 mg/dL (ref 6–20)
CALCIUM: 8.8 mg/dL — AB (ref 8.9–10.3)
CHLORIDE: 100 mmol/L — AB (ref 101–111)
CO2: 28 mmol/L (ref 22–32)
Creatinine, Ser: 6.13 mg/dL — ABNORMAL HIGH (ref 0.44–1.00)
GFR calc non Af Amer: 8 mL/min — ABNORMAL LOW (ref >60)
GFR, EST AFRICAN AMERICAN: 9 mL/min — AB (ref >60)
GLUCOSE: 65 mg/dL (ref 65–99)
Phosphorus: 4.2 mg/dL (ref 2.5–4.6)
Potassium: 3.7 mmol/L (ref 3.5–5.1)
SODIUM: 137 mmol/L (ref 135–145)

## 2015-02-07 LAB — CBC
HCT: 28.5 % — ABNORMAL LOW (ref 36.0–46.0)
HEMOGLOBIN: 8.8 g/dL — AB (ref 12.0–15.0)
MCH: 29.1 pg (ref 26.0–34.0)
MCHC: 30.9 g/dL (ref 30.0–36.0)
MCV: 94.4 fL (ref 78.0–100.0)
PLATELETS: 135 10*3/uL — AB (ref 150–400)
RBC: 3.02 MIL/uL — AB (ref 3.87–5.11)
RDW: 19.1 % — ABNORMAL HIGH (ref 11.5–15.5)
WBC: 8.6 10*3/uL (ref 4.0–10.5)

## 2015-02-07 MED ORDER — DARBEPOETIN ALFA 200 MCG/0.4ML IJ SOSY
PREFILLED_SYRINGE | INTRAMUSCULAR | Status: AC
  Filled 2015-02-07: qty 0.4

## 2015-02-07 MED ORDER — HEPARIN SODIUM (PORCINE) 1000 UNIT/ML DIALYSIS: 40.0000 [IU]/kg | INTRAMUSCULAR | Status: DC | PRN

## 2015-02-07 NOTE — IPOC Note (Signed)
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[facility])   Sepsis ([place])   Left lateral knee pain     Functional Problem List: Nursing Bowel, Edema, Endurance, Medication Management, Nutrition, Pain, Safety, Skin Integrity  PT Balance, Edema, Endurance, Motor, Pain, Skin Integrity  OT    SLP    TR         Basic ADL's: OT       Advanced  ADL's: OT       Transfers: PT Bed Mobility, Bed to Chair, Car, Furniture  OT       Locomotion: PT Ambulation, Emergency planning/management officer, Stairs     Additional Impairments: OT    SLP        TR      Anticipated Outcomes Item Anticipated Outcome  Self Feeding independent  Swallowing      Basic self-care  supervision  Toileting  supervision   Bathroom Transfers supervision  Bowel/Bladder  Manage bowels with min assist  Transfers  supervision basic; min assist car  Locomotion  supervision gait houshold x 25', controlled environment and community x 75'; stair TBD; supervision w/c x 50' household, 150' in controlled and community environments  Communication     Cognition     Pain  <3 on a 0-10 scale  Safety/Judgment  no falls while on rehab with min assist   Therapy Plan: PT Intensity: Minimum of 1-2 x/day ,45 to 90 minutes PT Duration Estimated Length of Stay: 12-14 OT Intensity: Minimum of 1-2 x/day, 45 to 90 minutes OT Frequency: 5 out of 7 days OT Duration/Estimated Length of Stay: 12-14 days         Team Interventions: Nursing Interventions Patient/Family Education, Bowel Management, Disease Management/Prevention, Pain Management, Medication Management, Skin Care/Wound Management, Discharge Planning, Psychosocial Support  PT interventions Ambulation/gait  training, Balance/vestibular training, Cognitive remediation/compensation, Discharge planning, Community reintegration, DME/adaptive equipment instruction, Functional electrical stimulation, Functional mobility training, Patient/family education, Pain management, Neuromuscular re-education, Psychosocial support, Splinting/orthotics, Therapeutic Exercise, Therapeutic Activities, Stair training, UE/LE Strength taining/ROM, UE/LE Coordination activities, Wheelchair propulsion/positioning  OT Interventions Balance/vestibular training, Therapeutic Exercise, Self Care/advanced ADL retraining, Neuromuscular re-education, Therapeutic Activities, Patient/family education, Discharge planning, Functional mobility training, Psychosocial support, UE/LE Strength taining/ROM, DME/adaptive equipment instruction, Community reintegration, Cognitive remediation/compensation, UE/LE Coordination activities  SLP Interventions    TR Interventions    SW/CM Interventions Discharge Planning, Barrister's clerk, Patient/Family Education    Team Discharge Planning: Destination: PT-Home ,OT- Home , SLP-  Projected Follow-up: PT-Home health PT, OT-  Home health OT, SLP-  Projected Equipment Needs: PT-Rolling walker with 5" wheels, OT- None recommended by OT, SLP-  Equipment Details: PT-w/c TBD, OT-  Patient/family involved in discharge planning: PT- Patient,  OT-Patient, SLP-   MD ELOS: 10-14d Medical Rehab Prognosis:  Good Assessment: 39 y.o. 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 Received inpatient rehabilitation services 01/06/38 to 01/16/38 after right MCA infarct. She was discharged to home ambulating modified independent. Patient lives with her mother and 2 children ages 20 and 67 and used a walker prior to admission. She does not have 24/7 support on discharge.  One level home. Presented 01/25/2015 with low-grade fevers  and placed on broad-spectrum antibiotics. Culture showed pansensitive Pseudomonas catheter line bloodstream infection. Hospital course bouts of hypotension. Neurology consulted 01/25/2015 with left leg weakness. MRI of the brain showed medial expansion of anterior right MCA territory infarct and acute right basal ganglia infarct. Patient had been on Coumadin which was recently stopped. Coumadin currently on hold with recent TEE showing no evidence of vegetations and reassess after antibiotics completed 02/09/2015 with question to repeat TEE   Now requiring 24/7 Rehab RN,MD, as well as CIR level PT, OT and SLP.  Treatment team will focus on ADLs and mobility with goals set at sup  See Team Conference Notes for weekly updates to the plan of care

## 2015-02-07 NOTE — Progress Notes (Signed)
Occupational Therapy Session Note  Patient Details  Name: [name] MRN: [id number] Date of Birth: 1976-02-25  Today's Date: 02/07/2015 OT Individual Time: 1030-1150 OT Individual Time Calculation (min): 80 min  and Today's Date: 02/07/2015 OT Missed Time: 10 Minutes Missed Time Reason: Patient fatigue   Short Term Goals: Week 1:  OT Short Term Goal 1 (Week 1): Pt will perform LB bathing sit to stand with min assist and AE.  OT Short Term Goal 2 (Week 1): Pt will perform LB dressing sit to stand with min assist and AE PRN. OT Short Term Goal 3 (Week 1): Pt will perform toilet transfers with min assist 3 consecutive sessions. OT Short Term Goal 4 (Week 1): Pt will perform walk-in shower transfer with min assist using the RW.  OT Short Term Goal 5 (Week 1): Pt will perform BUE light resistance exercise with supervision following written exercise plan.    Skilled Therapeutic Interventions/Progress Updates:  Upon entering the room, pt supine in bed with 3/10 c/o pain in L knee this session. Pt finishing eating breakfast upon entering the room. Pt requiring maximal encouragement for participation in OT services this session. Pt reporting, "I got washed up already this morning." Pt agreeable to dressing while seated on EOB. Supine >sit from flat bed to EOB for dressing task. Pt requiring steady assist to thread LB clothing while seated on EOB. Pt very anxious about standing to pull pants up. Pt laying down and rolling L <> R with supervision and min verbal cues for technique in order to pull pants over B hips. Once dressed, pt performed sit >stand from EOB with min A and ambulated ~ 3' with rollator and min A to wheelchair. Pt propelling wheelchair to sink for grooming tasks with set up at wheelchair level. Pt agreeable to remain in wheelchair at end of session. Call bell and all needed items within reach.   Therapy Documentation Precautions:  Precautions Precautions: Fall Precaution  Comments: Pt's femoral cath is tunneled therefore she is able to get OOB; L knee joint pain Restrictions Weight Bearing Restrictions: No General: General OT Amount of Missed Time: 10 Minutes  See Function Navigator for Current Functional Status.   Therapy/Group: Individual [name] 02/07/2015, 12:23 PM

## 2015-02-07 NOTE — Progress Notes (Signed)
Patient ID: [name], female   DOB: [birth year], 39 y.o.   MRN: [id number]  [facility] Progress Note   Assessment/ Plan:   1. Pseudomonas sepsis: Suspected to be catheter related bacteremia prompting discontinuation of dialysis catheter and initiation of use of her left thigh graft. Remains on Fortaz until 11/12. She is afebrile and with improved WBCcount pointing away from AVG seeding/infection. 2. ESRD: On TTS HD schedule- next HD today. Left thigh swelling/pain better today- no evidence of infection. 3. Anemia of chronic kidney disease: Stable hemoglobin, continue ESA. No overt losses noted. 4. Metabolic bone disease: started renvela [facility] for rising phosphorus. Not on vitamin D receptor analogue. 5. Hypotension: Continue midodrine for blood pressure support and ultrafiltration for volume management. 6. Malnutrition/deconditioning: Continue inpatient rehabilitation with dietary nutritional supplementation  Subjective:   Reports improvement of left thigh swelling and pain- having difficulty with PT/OT due to weakness and apprehension of pain   Objective:   BP 132/70 mmHg  Pulse 89  Temp(Src) 98.4 F (36.9 C) (Oral)  Resp 18  Wt 91.3 kg (201 lb 4.5 oz)  SpO2 100%  LMP 01/21/2015  Physical Exam: Gen: Resting comfortably in bed CVS: Pulse regular in rate and rhythm, S1 and S2 normal Resp: Clear to auscultation, no rales Abd: Soft, obese, nontender Ext: Decreasing edema over left thigh AVG. No pretibial/ankle edema   Labs: BMET  Recent Labs Lab 02/01/15 0353 02/02/15 0459 02/03/15 0302 02/04/15 0415 02/04/15 1930  NA 138 135 137 136 136  K 3.5 3.3* 3.9 3.5 3.7  CL 101 99* 100* 102 99*  CO2 27 27 30 28 27   GLUCOSE 59* 74 79 75 79  BUN 13 19 9 14 17   CREATININE 4.94* 6.20* 4.19* 5.74* 6.39*  CALCIUM 8.0* 8.3* 8.3* 8.3* 8.6*  PHOS 2.7 3.9 3.3 4.4 5.0*   CBC  Recent Labs Lab 02/01/15 0353 02/04/15 1930 02/06/15 1051  WBC 7.2 11.7* 7.7   NEUTROABS  --   --  6.8  HGB 10.5* 12.1 9.3*  HCT 32.5* 38.1 29.3*  MCV 91.3 94.3 94.8  PLT 88* 127* 100*   Medications:    . atorvastatin  20 mg Oral q1800  . cefTAZidime (FORTAZ)  IV  2 g Intravenous Q T,Th,Sa-HD  . darbepoetin (ARANESP) injection - DIALYSIS  200 mcg Intravenous Q Thu-HD  . feeding supplement (PRO-STAT SUGAR FREE 64)  30 mL Oral BID  . hydroxychloroquine  400 mg Oral Daily  . metoprolol succinate  25 mg Oral QHS  . midodrine  10 mg Oral BID WC  . multivitamin  1 tablet Oral QHS  . pantoprazole  40 mg Oral Daily  . sevelamer carbonate  1,600 mg Oral TID WC   Elmarie Shiley, MD 02/07/2015, 9:13 AM

## 2015-02-07 NOTE — Progress Notes (Signed)
Subjective/Complaints: Pt without new issues ROS:  No sweats chills, no cough, no other jt pains Objective: Vital Signs: Blood pressure 132/70, pulse 89, temperature 98.4 F (36.9 C), temperature source Oral, resp. rate 18, weight 91.3 kg (201 lb 4.5 oz), last menstrual period 01/21/2015, SpO2 100 %. Dg Knee 1-2 Views Left  02/06/2015  CLINICAL DATA:  Left knee pain and swelling for weeks. EXAM: LEFT KNEE - 1-2 VIEW COMPARISON:  None. FINDINGS: No fracture or dislocation. The alignment and joint spaces are maintained. Tiny inferior patellar osteophytes. No large joint effusion. Mild subcutaneous edema seen of the knee and lower leg. IMPRESSION: Diffuse soft tissue edema.  Tiny patellar osteophytes. Electronically Signed   By: Jeb Levering M.D.   On: 02/06/2015 00:10   Results for orders placed or performed during the hospital encounter of 02/04/15 (from the past 72 hour(s))  CBC     Status: Abnormal   Collection Time: 02/04/15  7:30 PM  Result Value Ref Range   WBC 11.7 (H) 4.0 - 10.5 K/uL   RBC 4.04 3.87 - 5.11 MIL/uL   Hemoglobin 12.1 12.0 - 15.0 g/dL   HCT 38.1 36.0 - 46.0 %   MCV 94.3 78.0 - 100.0 fL   MCH 30.0 26.0 - 34.0 pg   MCHC 31.8 30.0 - 36.0 g/dL   RDW 19.1 (H) 11.5 - 15.5 %   Platelets 127 (L) 150 - 400 K/uL  Renal function panel     Status: Abnormal   Collection Time: 02/04/15  7:30 PM  Result Value Ref Range   Sodium 136 135 - 145 mmol/L   Potassium 3.7 3.5 - 5.1 mmol/L   Chloride 99 (L) 101 - 111 mmol/L   CO2 27 22 - 32 mmol/L   Glucose, Bld 79 65 - 99 mg/dL   BUN 17 6 - 20 mg/dL   Creatinine, Ser 6.39 (H) 0.44 - 1.00 mg/dL   Calcium 8.6 (L) 8.9 - 10.3 mg/dL   Phosphorus 5.0 (H) 2.5 - 4.6 mg/dL   Albumin 1.8 (L) 3.5 - 5.0 g/dL   GFR calc non Af Amer 7 (L) >60 mL/min   GFR calc Af Amer 9 (L) >60 mL/min    Comment: (NOTE) The eGFR has been calculated using the CKD EPI equation. This calculation has not been validated in all clinical situations. eGFR's  persistently <60 mL/min signify possible Chronic Kidney Disease.    Anion gap 10 5 - 15  CBC with Differential/Platelet     Status: Abnormal   Collection Time: 02/06/15 10:51 AM  Result Value Ref Range   WBC 7.7 4.0 - 10.5 K/uL   RBC 3.09 (L) 3.87 - 5.11 MIL/uL   Hemoglobin 9.3 (L) 12.0 - 15.0 g/dL    Comment: SPECIMEN CHECKED FOR CLOTS REPEATED TO VERIFY    HCT 29.3 (L) 36.0 - 46.0 %   MCV 94.8 78.0 - 100.0 fL   MCH 30.1 26.0 - 34.0 pg   MCHC 31.7 30.0 - 36.0 g/dL   RDW 19.3 (H) 11.5 - 15.5 %   Platelets 100 (L) 150 - 400 K/uL    Comment: SPECIMEN CHECKED FOR CLOTS REPEATED TO VERIFY PLATELET COUNT CONFIRMED BY SMEAR    Neutrophils Relative % 89 %   Neutro Abs 6.8 1.7 - 7.7 K/uL    Comment: CORRECTED ON 11/09 AT 1151: PREVIOUSLY REPORTED AS 6.9   Lymphocytes Relative 6 %   Lymphs Abs 0.5 (L) 0.7 - 4.0 K/uL   Monocytes Relative 4 %  Monocytes Absolute 0.3 0.1 - 1.0 K/uL   Eosinophils Relative 0 %   Eosinophils Absolute 0.0 0.0 - 0.7 K/uL   Basophils Relative 1 %   Basophils Absolute 0.1 0.0 - 0.1 K/uL  Uric acid     Status: None   Collection Time: 02/06/15 10:51 AM  Result Value Ref Range   Uric Acid, Serum 4.9 2.3 - 6.6 mg/dL  Sedimentation rate     Status: None   Collection Time: 02/06/15 10:51 AM  Result Value Ref Range   Sed Rate 4 0 - 22 mm/hr  C-reactive protein     Status: Abnormal   Collection Time: 02/06/15 10:51 AM  Result Value Ref Range   CRP 1.1 (H) <1.0 mg/dL     HEENT: normal Cardio: RRR and no murmur Resp: CTA B/L and unlabored GI: BS positive and NT, ND Extremity:  Pulses positive and No Edema Skin:   Wound Left thigh swollen aroung graft, no drainage from access site, mildly tender Neuro: Alert/Oriented, Normal Sensory and Abnormal Motor 4/5 Left delt, bi, tri, grip, 3- L hip flex, knee xt, ankle DF/PF Musc/Skel:  Swelling Left knee, no jt line tenderness, has L thigh swelling as well Gen NAD   Assessment/Plan: 1. Functional deficits  secondary to right basal ganglia infarct with increased LLE weakness and debility from recent bacteremia which require 3+ hours per day of interdisciplinary therapy in a comprehensive inpatient rehab setting. Physiatrist is providing close team supervision and 24 hour management of active medical problems listed below. Physiatrist and rehab team continue to assess barriers to discharge/monitor patient progress toward functional and medical goals. FIM: Function - Bathing Position: Sitting EOB Body parts bathed by patient: Right arm, Left arm, Chest, Abdomen, Right upper leg, Left upper leg Body parts bathed by helper: Back, Front perineal area, Buttocks Bathing not applicable: Left lower leg, Right lower leg  Function- Upper Body Dressing/Undressing What is the patient wearing?: Hospital gown Set up : To obtain clothing/put away Function - Lower Body Dressing/Undressing What is the patient wearing?: Underwear, Non-skid slipper socks Underwear - Performed by helper: Thread/unthread right underwear leg, Thread/unthread left underwear leg, Pull underwear up/down Non-skid slipper socks- Performed by helper: Don/doff right sock, Don/doff left sock        Function - Chair/bed transfer Chair/bed transfer method: Ambulatory Chair/bed transfer assist level: Moderate assist (Pt 50 - 74%/lift or lower) Chair/bed transfer assistive device: Other (rollator) Chair/bed transfer details: Verbal cues for precautions/safety, Verbal cues for safe use of DME/AE  Function - Locomotion: Wheelchair Will patient use wheelchair at discharge?: Yes Type: Manual Max wheelchair distance: 30 Assist Level: Supervision or verbal cues Turns around,maneuvers to table,bed, and toilet,negotiates 3% grade,maneuvers on rugs and over doorsills: No Function - Locomotion: Ambulation Max distance: 6 Assist level: 2 helpers  Function - Comprehension Comprehension: Auditory Comprehension assist level: Follows basic  conversation/direction with extra time/assistive device  Function - Expression Expression: Verbal Expression assist level: Expresses complex 90% of the time/cues < 10% of the time  Function - Social Interaction Social Interaction assist level: Interacts appropriately with others with medication or extra time (anti-anxiety, antidepressant).  Function - Problem Solving Problem solving assist level: Solves basic 50 - 74% of the time/requires cueing 25 - 49% of the time  Function - Memory Memory assist level: Recognizes or recalls less than 25% of the time/requires cueing greater than 75% of the time Patient normally able to recall (first 3 days only): Current season, Staff names and faces, That he   or she is in a hospital  Medical Problem List and Plan: 1. Functional deficits secondary to right basal ganglia infarct and debility from recent bacteremia.  Increased weakness noted in LLE ? Related to decondtioning vs new infarct 2.  DVT Prophylaxis/Anticoagulation: Chronic Coumadin on hold until antibiotics completed 02/09/2015 reassess 3. Pain Management: Oxycodone and Flexeril as needed. Monitor with increased mobility, left knee pain, - xray, some erythema,appreciate ortho note, doubt septic jt, exam today appears more extra articular, ? ITB syndrome 4. ID/Pseudomonas sepsis. Continue Tressie Ellis 02/09/2015 5. Neuropsych: This patient is capable of making decisions on her own behalf. 6. Skin/Wound Care: Routine skin checks 7. Fluids/Electrolytes/Nutrition: Routine I/Os with follow-up chemistries 8. End-stage renal disease/lupus nephritis. Continue hemodialysis as per renal services, uses new Left femoral graft for HD    9. Orthostatic hypotension.Midodrine 10 mg twice a day 10. Chronic anemia. Continue Aranesp 11. Hyperlipidemia. Lipitor 12.  Mood and affect flat- psych eval today, pt now willing to consider SSRI if depression screen is positive  LOS (Days) 3 A FACE TO FACE EVALUATION WAS  PERFORMED  Dupree Givler E 02/07/2015, 7:35 AM

## 2015-02-07 NOTE — Progress Notes (Signed)
Physical Therapy Session Note  Patient Details  Name: [name] MRN: [id number] Date of Birth: 1975-10-30  Today's Date: 02/07/2015 PT Individual Time: 1430-1530 PT Individual Time Calculation (min): 60 min   Short Term Goals: Week 1:  PT Short Term Goal 1 (Week 1): pt will move supine> sitting with min assist PT Short Term Goal 2 (Week 1): pt will move stand> sit with min assist  PT Short Term Goal 3 (Week 1): pt will propel w/c x 50' with superviison PT Short Term Goal 4 (Week 1): pt will perform gait x 20' with assist of 1 person and LRAD PT Short Term Goal 5 (Week 1): pt will tolerate static stander x 15 minutes  Skilled Therapeutic Interventions/Progress Updates:    Pt seated EOB needing menstrual pad changed - assisted pt to supine with min assist and cues for technique. Performed rolling with min assist with cues for technique for removal and placement of brief, bedpan (pt requesting need for BM), and menstrual pad. Pt declines OOB due to fatigue but agreeable to supine therex. Performed quad sets with 5 second hold, ankle alphabet, assisted heel slides, hip IR/ER with 5 second hold and hip abduction x 10 reps each on LLE for functional strengthening and ROM. Pt with multiple questions about HD - recommended she ask nephrologist or PA here about medical questions and alerted RN of pt wanting to speak with PA.   Therapy Documentation Precautions:  Precautions Precautions: Fall Precaution Comments: Pt's femoral cath is tunneled therefore she is able to get OOB; L knee joint pain Restrictions Weight Bearing Restrictions: No   Pain:  c/o knee pain on L 4/10 - deferring pain medication until after HD.  See Function Navigator for Current Functional Status.   Therapy/Group: Individual Therapy  Canary Brim Ivory Broad, PT, DPT  02/07/2015, 3:38 PM

## 2015-02-07 NOTE — Progress Notes (Signed)
Occupational Therapy Session Note  Patient Details  Name: [name] MRN: [id number] Date of Birth: 1975-08-13  Today's Date: 02/07/2015 OT Individual Time: 1330-1430 OT Individual Time Calculation (min): 60 min    Short Term Goals: Week 1:  OT Short Term Goal 1 (Week 1): Pt will perform LB bathing sit to stand with min assist and AE.  OT Short Term Goal 2 (Week 1): Pt will perform LB dressing sit to stand with min assist and AE PRN. OT Short Term Goal 3 (Week 1): Pt will perform toilet transfers with min assist 3 consecutive sessions. OT Short Term Goal 4 (Week 1): Pt will perform walk-in shower transfer with min assist using the RW.  OT Short Term Goal 5 (Week 1): Pt will perform BUE light resistance exercise with supervision following written exercise plan.    Skilled Therapeutic Interventions/Progress Updates:    Pt seen for OT therapy session focusing on functional transfers and functional activity tolerance. Pt in supine upon arrival, voicing complaints of nausea, however, agreeable to tx session. She transferred to EOB without use of hospital functions for home simulation. Multimodal cues provided for technique and assist to bring trunk upright. She ambulated throughout room using rollator with min A. VCs and reassurance required throughout session as pt with strong fear of falling. Educated regarding safety measures taken and increased risk of falls if over cautious.  In ADL apartment, discussed with pt showering options and DME for home use of tub/shower combination vs, walk-in shower. Pt verbalized wanting to attempted tub transfer bench transfer. Significantly increased time and assist for management of pt's R LE required. Pt unable to fully get into shower due to fatigue.  Pt returned to room at end of session for nursing care to be provided. Pt left sitting EOB with hand off to PT and RN present. Pt educated throughout session regarding fall risk, energy conservation, DME, deep  breathing techniques, need for assist, and d/c planning.   Therapy Documentation Precautions:  Precautions Precautions: Fall Precaution Comments: Pt's femoral cath is tunneled therefore she is able to get OOB; L knee joint pain Restrictions Weight Bearing Restrictions: No  See Function Navigator for Current Functional Status.   Therapy/Group: Individual Therapy  Lewis, Woodson Macha C 02/07/2015, 3:20 PM

## 2015-02-08 ENCOUNTER — Inpatient Hospital Stay (HOSPITAL_COMMUNITY): Payer: Medicaid Other | Admitting: *Deleted

## 2015-02-08 ENCOUNTER — Inpatient Hospital Stay (HOSPITAL_COMMUNITY): Payer: Medicaid Other | Admitting: Occupational Therapy

## 2015-02-08 DIAGNOSIS — F320 Major depressive disorder, single episode, mild: Secondary | ICD-10-CM | POA: Diagnosis present

## 2015-02-08 DIAGNOSIS — N186 End stage renal disease: Secondary | ICD-10-CM

## 2015-02-08 DIAGNOSIS — M3214 Glomerular disease in systemic lupus erythematosus: Secondary | ICD-10-CM

## 2015-02-08 DIAGNOSIS — Z992 Dependence on renal dialysis: Secondary | ICD-10-CM

## 2015-02-08 DIAGNOSIS — I63511 Cerebral infarction due to unspecified occlusion or stenosis of right middle cerebral artery: Principal | ICD-10-CM

## 2015-02-08 DIAGNOSIS — I058 Other rheumatic mitral valve diseases: Secondary | ICD-10-CM

## 2015-02-08 NOTE — Consult Note (Signed)
  INITIAL PSYCHODIAGNOSTIC EXAMINATION - CONFIDENTIAL [facility]   Ms. Doreen Pilarczyk is a 39 year old, right-handed woman, who was previously admitted to the inpatient rehabilitation unit following a right MCA infarct.  She also has history of lupus, end-stage renal disease, and multiple other medical conditions.  During her prior stay, she was seen by neuropsychology; an evaluation that revealed the presence of adjustment disorder with depressed mood.  She was encouraged to pursue individual psychotherapy post-discharge.  Ms. Kuenning was re-admitted on 01/25/15 with bloodstream infection.  MRI of her brain demonstrated expansion of anterior right MCA territory infarct and acute right basal ganglia infarct.    During the clinical interview today, Ms. Kranich acknowledged multiple symptoms of depression, including low energy, increased irritability, feelings of isolation and hopelessness, excessive guilt (e.g. blaming herself for having lupus), anhedonia, etc.  She noted that she feels frustrated with herself because she cannot physically accomplish even seemingly simple tasks.  She also noted frustration about her situation in general.  Although she acknowledged continued support from one of her sisters, she said that she feels as though the staff and other people in her life do not have an appreciation for what she is going through.  She noted that despite all of the aforementioned symptoms, she is "hanging in there" and she denied suicidal ideation.  She mentioned that she often thinks of her children in order to maintain motivation to persevere.  Time was spent exploring the benefits of self-care and making a goal of appreciating herself enough to want to improve physically for herself in addition to being motivated for her children.  She was provided with a "homework" assignment to write a list of good things about herself; characteristics about herself that are worth saving.  The  neuropsychologist will check in with her again next week to continue to monitor her mood and to provide continued support.    IMPRESSIONS AND RECOMMENDATIONS:  Ms. Holtsclaw acknowledged symptoms suggestive of a Major Depressive Disorder, single episode.  As mentioned above, she was encouraged to begin considering positive aspects of herself that could serve as motivation for recovery.  She will continue to be followed by the neuropsychologist and a follow-up session next week should be scheduled for that purpose.  In the meantime, owing to her diagnosis of Major Depressive Disorder, her physician may consider initiation of an anti-depressant medication.  Ms. Krabbenhoft was agreeable to this.    DIAGNOSIS:   Major Depressive Disorder, Single episode  Marlane Hatcher, Psy.D.  Clinical Neuropsychologist

## 2015-02-08 NOTE — Progress Notes (Signed)
Social Work Patient ID: [name], female   DOB: 1975/08/21, 39 y.o.   MRN: [id number] Met with pt and her parents who were here visiting Mom would rather not have a target discharge date due to pt becomes fixated on this. Discussed team will meet again Wed to discuss progress and medical condition. Pt states: " I am pushing myself I want to be better than last time." She wants to be home by Thanksgiving. Mom just doesn't want her to get her hopes up. Aware that ortho will monitor and come back in if called for her knee issue.

## 2015-02-08 NOTE — Progress Notes (Signed)
Occupational Therapy Session Note  Patient Details  Name: [name] MRN: [id number] Date of Birth: Mar 10, 1976  Today's Date: 02/08/2015 OT Individual Time:  -   1530-1600  (30 min)      Short Term Goals: Week 1:  OT Short Term Goal 1 (Week 1): Pt will perform LB bathing sit to stand with min assist and AE.  OT Short Term Goal 2 (Week 1): Pt will perform LB dressing sit to stand with min assist and AE PRN. OT Short Term Goal 3 (Week 1): Pt will perform toilet transfers with min assist 3 consecutive sessions. OT Short Term Goal 4 (Week 1): Pt will perform walk-in shower transfer with min assist using the RW.  OT Short Term Goal 5 (Week 1): Pt will perform BUE light resistance exercise with supervision following written exercise plan.      Skilled Therapeutic Interventions/Progress Updates:    Engaged in LLE stretching and myotherapy to IT band below knee.  Pt performed UE AROM exercises to BUE.  Left pt in bed with all needs in reach.    Therapy Documentation Precautions:  Precautions Precautions: Fall Precaution Comments: Pt's femoral cath is tunneled therefore she is able to get OOB; L knee joint pain Restrictions Weight Bearing Restrictions: No      Pain:  5/10  ;eft lower leg             See Function Navigator for Current Functional Status.   Therapy/Group: Individual Therapy  Lisa Roca 02/08/2015, 3:36 PM

## 2015-02-08 NOTE — Progress Notes (Signed)
Social Work Patient ID: [name], female   DOB: Oct 14, 1975, 39 y.o.   MRN: [id number] Met with pt to inform contacted [facility] to let them know she is in the hospital, reason for not getting back to them. She is feeling better today and pushing Herself in therapies.

## 2015-02-08 NOTE — Progress Notes (Signed)
Physical Therapy Session Note  Patient Details  Name: [name] MRN: [id number] Date of Birth: 27-Jun-1975  Today's Date: 02/08/2015 PT Individual Time: 9900-1035 and 1305-1350 PT Individual Time Calculation (min): 35min and 45 min   Short Term Goals: Week 1:  PT Short Term Goal 1 (Week 1): pt will move supine> sitting with min assist PT Short Term Goal 2 (Week 1): pt will move stand> sit with min assist  PT Short Term Goal 3 (Week 1): pt will propel w/c x 50' with superviison PT Short Term Goal 4 (Week 1): pt will perform gait x 20' with assist of 1 person and LRAD PT Short Term Goal 5 (Week 1): pt will tolerate static stander x 15 minutes  Skilled Therapeutic Interventions/Progress Updates:  Tx focused on functional mobility training, activity tolerance, pt education, gait with RW and NMR. PT up in [facility], requesting to get in bed for hygiene care. Pt was educated on importance of sit<>stands and therapeutic activity to reach her goals. Pt very fearful of falling again (not during this admission) but was encouraged with support.  Knee was ACE wrapped for support, with pain 3/10 throughout tx on L.   Therapeutic activity Performed sit<>stand while NT moved WC and BSC as well as during cleansing and changing pad. Pt required up to Max lifting/lowering assist x6 at rollator throughout tx and was able to stand 3x79min with encouragement and postural cues for upward gaze/posture. Stand-pivot transfers x2 throughout with mod/mx A and cues for technique.  Pt propelled WC x35' straight path with cues for efficiency; increased time required. Basic back added for postural support.   NMR in unsupported sitting durign functional tasks and UE supported standing, focusing on balance training and postural control. Pt needed cues for engaging trunk with less UE reliance, manual facilitation at trunk. Pt able to stand with close S for safety due to fear of falling.   Gait with RW 2x40' with min A around  trunk for safety/support and max encouragement to find her own goal; close WC follow.   Kinetron x8 min in sitting on machine with assist to turn to sit. Frequent rest breaks needed, but pt happy with usign exercise machine.  Pt requesting rest in bed at end of tx, but encouraged for OOB lunch and calling NT for assist with hygiene at [facility] prior to afternoon txs.  Mod A needed into bed and hot pack to knee for comfort.   Pt was fatigued, and not recall having a second session today. Pt agreeable to bedside tx, which included supine and seated therex for strengthening. Pt provided with handout for weekend use and was instructed in each of the following bil x10 with cues for technique and assist on LLE prn: ankle AROM ABC's, heel slides, hip ADD, SAQ, lower trunk rotation, and seated LAQ and marching. Hot pack provided at end of tx for knee comfort, requested K-pad from RN. Pt needed only Min A for sit<>supine with flat HOB, increased time and cues.      Therapy Documentation Precautions:  Precautions Precautions: Fall Precaution Comments: Pt's femoral cath is tunneled therefore she is able to get OOB; L knee joint pain Restrictions Weight Bearing Restrictions: No General:   Vital Signs: Therapy Vitals Temp: 97.5 F (36.4 C) Temp Source: Oral Pulse Rate: 93 Resp: 18 BP: (!) 114/58 mmHg Patient Position (if appropriate): Lying Oxygen Therapy SpO2: 100 % O2 Device: Not Delivered Pain: 3/10 and 3/10 - modified tx and hot pack   See  Function Navigator for Current Functional Status.   Therapy/Group: Individual Therapy  Miray Mancino, Corinna Lines, PT, DPT  02/08/2015, 9:59 AM

## 2015-02-08 NOTE — Progress Notes (Signed)
Subjective/Complaints: Asking whether her HD will be permanenet We discussed that the use  Of AVG means that HD is not temporary Directed further questions related to kidney fxn to Nephro ROS:  No sweats chills, no cough, no other jt pains Objective: Vital Signs: Blood pressure 114/58, pulse 93, temperature 97.5 F (36.4 C), temperature source Oral, resp. rate 18, weight 90 kg (198 lb 6.6 oz), last menstrual period 01/21/2015, SpO2 100 %. No results found. Results for orders placed or performed during the hospital encounter of 02/04/15 (from the past 72 hour(s))  CBC with Differential/Platelet     Status: Abnormal   Collection Time: 02/06/15 10:51 AM  Result Value Ref Range   WBC 7.7 4.0 - 10.5 K/uL   RBC 3.09 (L) 3.87 - 5.11 MIL/uL   Hemoglobin 9.3 (L) 12.0 - 15.0 g/dL    Comment: SPECIMEN CHECKED FOR CLOTS REPEATED TO VERIFY    HCT 29.3 (L) 36.0 - 46.0 %   MCV 94.8 78.0 - 100.0 fL   MCH 30.1 26.0 - 34.0 pg   MCHC 31.7 30.0 - 36.0 g/dL   RDW 19.3 (H) 11.5 - 15.5 %   Platelets 100 (L) 150 - 400 K/uL    Comment: SPECIMEN CHECKED FOR CLOTS REPEATED TO VERIFY PLATELET COUNT CONFIRMED BY SMEAR    Neutrophils Relative % 89 %   Neutro Abs 6.8 1.7 - 7.7 K/uL    Comment: CORRECTED ON 11/09 AT 1151: PREVIOUSLY REPORTED AS 6.9   Lymphocytes Relative 6 %   Lymphs Abs 0.5 (L) 0.7 - 4.0 K/uL   Monocytes Relative 4 %   Monocytes Absolute 0.3 0.1 - 1.0 K/uL   Eosinophils Relative 0 %   Eosinophils Absolute 0.0 0.0 - 0.7 K/uL   Basophils Relative 1 %   Basophils Absolute 0.1 0.0 - 0.1 K/uL  Uric acid     Status: None   Collection Time: 02/06/15 10:51 AM  Result Value Ref Range   Uric Acid, Serum 4.9 2.3 - 6.6 mg/dL  Sedimentation rate     Status: None   Collection Time: 02/06/15 10:51 AM  Result Value Ref Range   Sed Rate 4 0 - 22 mm/hr  C-reactive protein     Status: Abnormal   Collection Time: 02/06/15 10:51 AM  Result Value Ref Range   CRP 1.1 (H) <1.0 mg/dL  Renal  function panel     Status: Abnormal   Collection Time: 02/07/15  5:01 PM  Result Value Ref Range   Sodium 137 135 - 145 mmol/L   Potassium 3.7 3.5 - 5.1 mmol/L   Chloride 100 (L) 101 - 111 mmol/L   CO2 28 22 - 32 mmol/L   Glucose, Bld 65 65 - 99 mg/dL   BUN 12 6 - 20 mg/dL   Creatinine, Ser 6.13 (H) 0.44 - 1.00 mg/dL   Calcium 8.8 (L) 8.9 - 10.3 mg/dL   Phosphorus 4.2 2.5 - 4.6 mg/dL   Albumin 1.9 (L) 3.5 - 5.0 g/dL   GFR calc non Af Amer 8 (L) >60 mL/min   GFR calc Af Amer 9 (L) >60 mL/min    Comment: (NOTE) The eGFR has been calculated using the CKD EPI equation. This calculation has not been validated in all clinical situations. eGFR's persistently <60 mL/min signify possible Chronic Kidney Disease.    Anion gap 9 5 - 15  CBC     Status: Abnormal   Collection Time: 02/07/15  5:01 PM  Result Value Ref Range  WBC 8.6 4.0 - 10.5 K/uL   RBC 3.02 (L) 3.87 - 5.11 MIL/uL   Hemoglobin 8.8 (L) 12.0 - 15.0 g/dL   HCT 28.5 (L) 36.0 - 46.0 %   MCV 94.4 78.0 - 100.0 fL   MCH 29.1 26.0 - 34.0 pg   MCHC 30.9 30.0 - 36.0 g/dL   RDW 19.1 (H) 11.5 - 15.5 %   Platelets 135 (L) 150 - 400 K/uL     HEENT: normal Cardio: RRR and no murmur Resp: CTA B/L and unlabored GI: BS positive and NT, ND Extremity:  Pulses positive and No Edema Skin:   Wound Left thigh swollen aroung graft, no drainage from access site, mildly tender Neuro: Alert/Oriented, Normal Sensory and Abnormal Motor 4/5 Left delt, bi, tri, grip, 3- L hip flex, knee xt, ankle DF/PF Musc/Skel:  Swelling Left knee, no jt line tenderness, has L thigh swelling as well Gen NAD   Assessment/Plan: 1. Functional deficits secondary to right basal ganglia infarct with increased LLE weakness and debility from recent bacteremia which require 3+ hours per day of interdisciplinary therapy in a comprehensive inpatient rehab setting. Physiatrist is providing close team supervision and 24 hour management of active medical problems listed  below. Physiatrist and rehab team continue to assess barriers to discharge/monitor patient progress toward functional and medical goals. FIM: Function - Bathing Bathing activity did not occur: Refused Position: Sitting EOB Body parts bathed by patient: Right arm, Left arm, Chest, Abdomen, Right upper leg, Left upper leg Body parts bathed by helper: Back, Front perineal area, Buttocks Bathing not applicable: Left lower leg, Right lower leg  Function- Upper Body Dressing/Undressing What is the patient wearing?: Pull over shirt/dress Pull over shirt/dress - Perfomed by patient: Thread/unthread right sleeve, Thread/unthread left sleeve, Put head through opening, Pull shirt over trunk Assist Level: Set up Set up : To obtain clothing/put away Function - Lower Body Dressing/Undressing What is the patient wearing?: Pants Position: Bed Underwear - Performed by helper: Thread/unthread right underwear leg, Thread/unthread left underwear leg, Pull underwear up/down Pants- Performed by patient: Thread/unthread right pants leg, Thread/unthread left pants leg, Pull pants up/down, Fasten/unfasten pants Non-skid slipper socks- Performed by helper: Don/doff right sock, Don/doff left sock Assist for footwear: Setup Assist for lower body dressing: Touching or steadying assistance (Pt > 75%)  Function - Toileting Toileting steps completed by helper: Adjust clothing prior to toileting, Performs perineal hygiene, Adjust clothing after toileting  Function - Air cabin crew transfer activity did not occur: N/A  Function - Chair/bed transfer Chair/bed transfer method: Ambulatory Chair/bed transfer assist level: Touching or steadying assistance (Pt > 75%) Chair/bed transfer assistive device: Other (rollator) Chair/bed transfer details: Verbal cues for precautions/safety, Verbal cues for safe use of DME/AE  Function - Locomotion: Wheelchair Will patient use wheelchair at discharge?: Yes Type:  Manual Max wheelchair distance: 30 Assist Level: Supervision or verbal cues Turns around,maneuvers to table,bed, and toilet,negotiates 3% grade,maneuvers on rugs and over doorsills: No Function - Locomotion: Ambulation Max distance: 6 Assist level: 2 helpers  Function - Comprehension Comprehension: Auditory Comprehension assist level: Follows basic conversation/direction with extra time/assistive device  Function - Expression Expression: Verbal Expression assist level: Expresses complex 90% of the time/cues < 10% of the time  Function - Social Interaction Social Interaction assist level: Interacts appropriately with others with medication or extra time (anti-anxiety, antidepressant).  Function - Problem Solving Problem solving assist level: Solves basic 50 - 74% of the time/requires cueing 25 - 49% of the time  Function - Memory Memory assist level: Recognizes or recalls less than 25% of the time/requires cueing greater than 75% of the time Patient normally able to recall (first 3 days only): Current season, Staff names and faces, That he or she is in a hospital  Medical Problem List and Plan: 1. Functional deficits secondary to right basal ganglia infarct and debility from recent bacteremia.  Increased weakness noted in LLE ? Related to decondtioning vs new infarct 2.  DVT Prophylaxis/Anticoagulation: Chronic Coumadin on hold until antibiotics completed 02/09/2015 3. Pain Management: Oxycodone and Flexeril as needed. Monitor with increased mobility, left knee pain, - xray, some erythema,appreciate ortho note, doubt septic jt, exam today appears more extra articular, ? ITB syndrome 4. ID/Pseudomonas sepsis. Continue Tressie Ellis 02/09/2015 5. Neuropsych: This patient is capable of making decisions on her own behalf. 6. Skin/Wound Care: Routine skin checks 7. Fluids/Electrolytes/Nutrition: Routine I/Os with follow-up chemistries 8. End-stage renal disease/lupus nephritis. Continue  hemodialysis as per renal services, uses new Left femoral graft for HD, Nephro to address pt's HD/TRansplant questions        9. Orthostatic hypotension.Midodrine 10 mg twice a day 10. Chronic anemia. Continue Aranesp 11. Hyperlipidemia. Lipitor 12.  Mood and affect flat- psych eval today, pt now willing to consider SSRI if depression screen is positive, await consult  LOS (Days) 4 A FACE TO FACE EVALUATION WAS PERFORMED  Brecklynn Jian E 02/08/2015, 7:49 AM

## 2015-02-08 NOTE — Progress Notes (Signed)
Patient ID: [name], female   DOB: [birth year], 39 y.o.   MRN: [id number]  [facility] Progress Note   Assessment/ Plan:   1. Pseudomonas sepsis: Suspected to be catheter related bacteremia prompting discontinuation of dialysis catheter and initiation of use of her left thigh graft. To continue [place] until tomorrow. She is afebrile and without any evidence of sepsis/seeding of right thigh graft. 2. ESRD: On TTS HD schedule- next HD tomorrow. She underwent hemodialysis without problems yesterday and voices minimal left thigh discomfort. 3. Anemia of chronic kidney disease: hemoglobin trending down- status post high dose Aranesp at dialysis yesterday. 4. Metabolic bone disease: on renvela [facility] for control of phosphorus. Not on vitamin D receptor analogue. 5. Hypotension: Continue midodrine for blood pressure support and ultrafiltration for volume management. 6. Malnutrition/deconditioning: Continue inpatient rehabilitation with dietary nutritional supplementation  Subjective:   She reports minimal left thigh pain over AVG and had uneventful dialysis yesterday. Her affect is more flat today and she wants me to call her sister on a number that she was given that seems to belong to somebody else [phone])-I tried and confirmed that he belongs to somebody else.   Objective:   BP 114/58 mmHg  Pulse 93  Temp(Src) 97.5 F (36.4 C) (Oral)  Resp 18  Wt 90 kg (198 lb 6.6 oz)  SpO2 100%  LMP 01/21/2015  Physical Exam: Gen: Resting sitting up in recliner CVS: Pulse regular in rate and rhythm, S1 and S2 normal Resp: Clear to auscultation, no rales Abd: Soft, obese, nontender Ext: Decreasing edema over left thigh AVG. No pretibial/ankle edema   Labs: BMET  Recent Labs Lab 02/02/15 0459 02/03/15 0302 02/04/15 0415 02/04/15 1930 02/07/15 1701  NA 135 137 136 136 137  K 3.3* 3.9 3.5 3.7 3.7  CL 99* 100* 102 99* 100*  CO2 27 30 28 27 28   GLUCOSE 74 79 75 79 65   BUN 19 9 14 17 12   CREATININE 6.20* 4.19* 5.74* 6.39* 6.13*  CALCIUM 8.3* 8.3* 8.3* 8.6* 8.8*  PHOS 3.9 3.3 4.4 5.0* 4.2   CBC  Recent Labs Lab 02/04/15 1930 02/06/15 1051 02/07/15 1701  WBC 11.7* 7.7 8.6  NEUTROABS  --  6.8  --   HGB 12.1 9.3* 8.8*  HCT 38.1 29.3* 28.5*  MCV 94.3 94.8 94.4  PLT 127* 100* 135*   Medications:    . atorvastatin  20 mg Oral q1800  . cefTAZidime (FORTAZ)  IV  2 g Intravenous Q T,Th,Sa-HD  . darbepoetin (ARANESP) injection - DIALYSIS  200 mcg Intravenous Q Thu-HD  . feeding supplement (PRO-STAT SUGAR FREE 64)  30 mL Oral BID  . hydroxychloroquine  400 mg Oral Daily  . metoprolol succinate  25 mg Oral QHS  . midodrine  10 mg Oral BID WC  . multivitamin  1 tablet Oral QHS  . pantoprazole  40 mg Oral Daily  . sevelamer carbonate  1,600 mg Oral TID WC   Elmarie Shiley, MD 02/08/2015, 9:04 AM

## 2015-02-08 NOTE — Progress Notes (Signed)
Occupational Therapy Session Note  Patient Details  Name: [name] MRN: [id number] Date of Birth: [birth year]  Today's Date: 02/08/2015 OT Individual Time: [id number] Individual Time Calculation (min): 57 min    Short Term Goals: Week 1:  OT Short Term Goal 1 (Week 1): Pt will perform LB bathing sit to stand with min assist and AE.  OT Short Term Goal 2 (Week 1): Pt will perform LB dressing sit to stand with min assist and AE PRN. OT Short Term Goal 3 (Week 1): Pt will perform toilet transfers with min assist 3 consecutive sessions. OT Short Term Goal 4 (Week 1): Pt will perform walk-in shower transfer with min assist using the RW.  OT Short Term Goal 5 (Week 1): Pt will perform BUE light resistance exercise with supervision following written exercise plan.    Skilled Therapeutic Interventions/Progress Updates:  Upon entering the room, pt supine in bed with 3/10 c/o pain in L knee this session. Pt refused bathing and dressing this session. Pt requiring maximal coaxing for participation this session. Pt movements are extremely slow as well. Pt performing supine >sit from flat bed with min verbal cues for proper technique. Once seated on EOB, pt performed sit >stand with min A. Pt ambulated with rollator ~ 5' with min A and  verbal cues for proper use of rollator. Pt sat in wheelchair with uncontrolled sit and requesting rest break secondary to fatigue. Pt propelled wheelchair to sink side for grooming tasks from wheelchair level as therapist unable to coax pt into standing for tasks. MD arrived during session and pt asking questions regarding hemodialysis. Pt tearful after physician leaving and requiring therapeutic use of self in order to encourage pt and educate pt on importance of therapeutic intervention. Breakfast tray placed in front of pt with call bell and all needed items within reach upon exiting the room.   Therapy Documentation Precautions:  Precautions Precautions:  Fall Precaution Comments: Pt's femoral cath is tunneled therefore she is able to get OOB; L knee joint pain Restrictions Weight Bearing Restrictions: No General:   Vital Signs: Therapy Vitals Temp: 97.5 F (36.4 C) Temp Source: Oral Pulse Rate: 93 Resp: 18 BP: (!) 114/58 mmHg Patient Position (if appropriate): Lying Oxygen Therapy SpO2: 100 % O2 Device: Not Delivered  See Function Navigator for Current Functional Status.   Therapy/Group: Individual Therapy  Phineas Semen 02/08/2015, 8:01 AM

## 2015-02-09 ENCOUNTER — Inpatient Hospital Stay (HOSPITAL_COMMUNITY): Payer: Medicaid Other

## 2015-02-09 ENCOUNTER — Inpatient Hospital Stay (HOSPITAL_COMMUNITY): Payer: Medicaid Other | Admitting: Occupational Therapy

## 2015-02-09 DIAGNOSIS — R091 Pleurisy: Secondary | ICD-10-CM

## 2015-02-09 LAB — CBC
HEMATOCRIT: 27.7 % — AB (ref 36.0–46.0)
HEMOGLOBIN: 8.3 g/dL — AB (ref 12.0–15.0)
MCH: 28.9 pg (ref 26.0–34.0)
MCHC: 30 g/dL (ref 30.0–36.0)
MCV: 96.5 fL (ref 78.0–100.0)
Platelets: 149 10*3/uL — ABNORMAL LOW (ref 150–400)
RBC: 2.87 MIL/uL — ABNORMAL LOW (ref 3.87–5.11)
RDW: 18.7 % — ABNORMAL HIGH (ref 11.5–15.5)
WBC: 7.5 10*3/uL (ref 4.0–10.5)

## 2015-02-09 LAB — RENAL FUNCTION PANEL
ALBUMIN: 1.8 g/dL — AB (ref 3.5–5.0)
ANION GAP: 7 (ref 5–15)
BUN: 8 mg/dL (ref 6–20)
CO2: 30 mmol/L (ref 22–32)
Calcium: 9 mg/dL (ref 8.9–10.3)
Chloride: 102 mmol/L (ref 101–111)
Creatinine, Ser: 5.45 mg/dL — ABNORMAL HIGH (ref 0.44–1.00)
GFR calc Af Amer: 10 mL/min — ABNORMAL LOW (ref >60)
GFR calc non Af Amer: 9 mL/min — ABNORMAL LOW (ref >60)
GLUCOSE: 64 mg/dL — AB (ref 65–99)
PHOSPHORUS: 3 mg/dL (ref 2.5–4.6)
POTASSIUM: 3.9 mmol/L (ref 3.5–5.1)
Sodium: 139 mmol/L (ref 135–145)

## 2015-02-09 MED ORDER — ALBUMIN HUMAN 25 % IV SOLN
12.5000 g | Freq: Once | INTRAVENOUS | Status: AC
  Administered 2015-02-09: 12.5 g via INTRAVENOUS

## 2015-02-09 MED ORDER — ALBUMIN HUMAN 25 % IV SOLN
INTRAVENOUS | Status: AC
  Filled 2015-02-09: qty 100

## 2015-02-09 MED ORDER — HEPARIN SODIUM (PORCINE) 1000 UNIT/ML DIALYSIS
40.0000 [IU]/kg | INTRAMUSCULAR | Status: DC | PRN
  Filled 2015-02-09: qty 4

## 2015-02-09 MED ORDER — PREDNISONE 20 MG PO TABS
20.0000 mg | ORAL_TABLET | Freq: Every day | ORAL | Status: AC
  Administered 2015-02-09 – 2015-02-13 (×5): 20 mg via ORAL
  Filled 2015-02-09 (×5): qty 1

## 2015-02-09 MED ORDER — CITALOPRAM HYDROBROMIDE 10 MG PO TABS
10.0000 mg | ORAL_TABLET | Freq: Every day | ORAL | Status: DC
  Administered 2015-02-09 – 2015-02-19 (×11): 10 mg via ORAL
  Filled 2015-02-09 (×11): qty 1

## 2015-02-09 NOTE — Progress Notes (Signed)
Occupational Therapy Session Note  Patient Details  Name: Serenah Mill MRN: [id number] Date of Birth: Mar 21, 1976  Today's Date: 02/09/2015 OT Individual Time: 0800-0900 OT Individual Time Calculation (min): 60 min    Short Term Goals: Week 1:  OT Short Term Goal 1 (Week 1): Pt will perform LB bathing sit to stand with min assist and AE.  OT Short Term Goal 2 (Week 1): Pt will perform LB dressing sit to stand with min assist and AE PRN. OT Short Term Goal 3 (Week 1): Pt will perform toilet transfers with min assist 3 consecutive sessions. OT Short Term Goal 4 (Week 1): Pt will perform walk-in shower transfer with min assist using the RW.  OT Short Term Goal 5 (Week 1): Pt will perform BUE light resistance exercise with supervision following written exercise plan.    Skilled Therapeutic Interventions/Progress Updates:    Pt received in bed stating how she had chest pain with breathing and her L leg was swollen and uncomfortable.  Pt had already gone for xrays, no orders to change therapy plan so proceeded with slow, gradual movement and cues for belly breathing to take pressure of the chest.  Spoke with RN and MD and provided pt with TED hose for LLE.  Donned for pt. Pt sat to EOB and opted to not bathe as she was bathed early this am when she awoke.  Pt was able to don clothing from EOB. A with socks, did not use sock aid today due to increased L foot edema. From EOB, pt worked on sit to stand using R hand to assist with pushing up with only min a to guide hips up/down. Pt concerned about L knee pain and knee buckling, applied ace wrap. Pt able to stand with steadying A 3x for close to a minute.  Pt then used 4WW to ambulate a few feet to recliner. Sat in recliner with feet elevated and set up for breakfast. Pt with all needs met.     Therapy Documentation Precautions:  Precautions Precautions: Fall Precaution Comments: Pt's femoral cath is tunneled therefore she is able to get OOB; L knee  joint pain Restrictions Weight Bearing Restrictions: No    Vital Signs: Therapy Vitals Temp: 97.8 F (36.6 C) Temp Source: Oral Pulse Rate: (!) 102 Resp: 18 BP: 106/62 mmHg Patient Position (if appropriate): Lying Oxygen Therapy SpO2: 98 % O2 Device: Not Delivered Pain: Pain Assessment Pain Score: 2  (L knee) ADL:  See Function Navigator for Current Functional Status.   Therapy/Group: Individual Therapy  Demerius Podolak 02/09/2015, 3:38 PM

## 2015-02-09 NOTE — Progress Notes (Signed)
Patient complaining of center chest pain 2/10 with each breath; denies SOB, denies radiating pain; no change in lung sounds.  O2 99%, HR 97. Called Rapid Response for work up opinion, recommendations given to RN.  Called MD, CXR ordered.  Patient updated on plan of care, patient sleeping/resting. Will continue to monitor.

## 2015-02-09 NOTE — Progress Notes (Addendum)
Physical Therapy Session Note  Patient Details  Name: [name] MRN: [id number] Date of Birth: Aug 31, 1975  Today's Date: 02/09/2015 PT Individual Time:0934-1048; 1300-1400;  PT Individual Time Calculation (min): 74, 60 min   Short Term Goals: Week 1:  PT Short Term Goal 1 (Week 1): pt will move supine> sitting with min assist PT Short Term Goal 2 (Week 1): pt will move stand> sit with min assist  PT Short Term Goal 3 (Week 1): pt will propel w/c x 50' with superviison PT Short Term Goal 4 (Week 1): pt will perform gait x 20' with assist of 1 person and LRAD PT Short Term Goal 5 (Week 1): pt will tolerate static stander x 15 minutes    Skilled Therapeutic Interventions/Progress Updates:   tx 1:  Pt resting in recliner.  PT consulted MD who stated chest pain is due to pleural inflammation, a Lupus exacerbation; Prednisone started.  No change in tx; monitor and adjust tx PRN.  2-3 attempts needed for sit> stand.  Pt became tearful and PT offered encouragement that pt will be able to do this.  Mod assist to stand and sit for maxstand pivot transfer to w/c.  Pt's L knee wrapped with 6" ACE for pain.  Attempted sit> stand at 4WW (22" high)  from w/c (18" high) but pt unable.  She repeatedly tried to use R hand down, L hand up on walker grip to do so, despite PT's recommendation to push up with bil hands.  +2 sit> stand pulling up on parallel bars.  neuromuscular re-education in standing for activities below.  Gait with 4WW x 18' when pt asked to sit down.  After resting a few minutes she asked for emesis basin.  Pt vomited tiny amount, and stated she was very nauseated.  RN informed.  PT returned pt to room and transferred to bed.  All needs left within reach, and bed alarm set.  tx 2:  Pt asleep in bed but easily aroused.  She stated she still felt nauseated, but willing to try.  Bed mobility with min assist for elevation of trunk; max cues for technique.  Stand pivot bed> w/c  with close supervision, cues. Pt benefits from initiating her forward wt shift and initial movement of sit> stand without helper's hands on her; helper's hands at hips during transfer PRN.  Gait x 30' , x 24" with 4WW with min guard assist.  Stand pivot transfer min assist to NuStep.   neuromuscular re-education for activities below.  W/c adjusted to raise seat height to 19", with improved sit> stand.  Up forward/down backward  (3) 3" high steps, 2 rails, min guard. No ACe on L knee this session.  PROM L knee in sitting upright = 95 degrees.  W/c> bed with min assist for LLE only.  Pt left resting in bed, HOB raised, bed alarm on, and all needs within reach.    Therapy Documentation Precautions:  Precautions Precautions: Fall Precaution Comments: Pt's femoral cath is tunneled therefore she is able to get OOB; L knee joint pain Restrictions Weight Bearing Restrictions: No  Pain: Pain Assessment Pain Assessment: 3/10 L knee; wrapped with ACE wrap; pt will use heat on it at end of day         Other Treatments: Treatments Therapeutic Activity: L><R wt shifting in standing for L stance stablity; calf raises in standing; L><R marching and ankle pumps in sitting; NuStep level 3 x 10 minutes for L knee AROM and trunk rotation  Neuromuscular Facilitation: Left;Lower Extremity;Activity to increase grading;Activity to increase sustained activation;Activity to increase timing and sequencing;Activity to increase lateral weight shifting;Activity to increase anterior-posterior weight shifting;Limitations;Forced use Neuromuscular Facilitation Limitations: L knee pain; difficulties learning new techniques See Function Navigator for Current Functional Status.   Therapy/Group: Individual Therapy  Savier Trickett 02/09/2015, 4:54 PM

## 2015-02-09 NOTE — Progress Notes (Signed)
Ran 4 hour hemodialysis Tx. Mid way in Tx she became hypotensive (BP in 70's), tachycardic (HR up to 126) short of breath, and began having some CP. Was given NS boluses, BFR was decreased, put on O2, and put on a 4K bath with minimal changes. Dr. Gay Filler informed, pt remained out of UF for the remaining Tx and 2 5% albumin given. BP, and HR normalized and chest pain stopped after Tx was complete and blood was rinsed back.

## 2015-02-09 NOTE — Progress Notes (Signed)
Subjective/Complaints: Discussed CP only with deep breath, no cough or SOB ROS:  No sweats chills, , no other jt pains Objective: Vital Signs: Blood pressure 118/67, pulse 100, temperature 98.3 F (36.8 C), temperature source Oral, resp. rate 18, weight 92.9 kg (204 lb 12.9 oz), last menstrual period 01/21/2015, SpO2 99 %. Dg Chest 2 View  02/09/2015  CLINICAL DATA:  Sharp chest pain EXAM: CHEST  2 VIEW COMPARISON:  01/17/2015 FINDINGS: Possible small left pleural effusion. No focal consolidation. No pneumothorax. Mild cardiomegaly. Visualized osseous structures are within normal limits. IMPRESSION: Possible small left pleural effusion. Electronically Signed   By: Julian Hy M.D.   On: 02/09/2015 08:46   Results for orders placed or performed during the hospital encounter of 02/04/15 (from the past 72 hour(s))  CBC with Differential/Platelet     Status: Abnormal   Collection Time: 02/06/15 10:51 AM  Result Value Ref Range   WBC 7.7 4.0 - 10.5 K/uL   RBC 3.09 (L) 3.87 - 5.11 MIL/uL   Hemoglobin 9.3 (L) 12.0 - 15.0 g/dL    Comment: SPECIMEN CHECKED FOR CLOTS REPEATED TO VERIFY    HCT 29.3 (L) 36.0 - 46.0 %   MCV 94.8 78.0 - 100.0 fL   MCH 30.1 26.0 - 34.0 pg   MCHC 31.7 30.0 - 36.0 g/dL   RDW 19.3 (H) 11.5 - 15.5 %   Platelets 100 (L) 150 - 400 K/uL    Comment: SPECIMEN CHECKED FOR CLOTS REPEATED TO VERIFY PLATELET COUNT CONFIRMED BY SMEAR    Neutrophils Relative % 89 %   Neutro Abs 6.8 1.7 - 7.7 K/uL    Comment: CORRECTED ON 11/09 AT 1151: PREVIOUSLY REPORTED AS 6.9   Lymphocytes Relative 6 %   Lymphs Abs 0.5 (L) 0.7 - 4.0 K/uL   Monocytes Relative 4 %   Monocytes Absolute 0.3 0.1 - 1.0 K/uL   Eosinophils Relative 0 %   Eosinophils Absolute 0.0 0.0 - 0.7 K/uL   Basophils Relative 1 %   Basophils Absolute 0.1 0.0 - 0.1 K/uL  Uric acid     Status: None   Collection Time: 02/06/15 10:51 AM  Result Value Ref Range   Uric Acid, Serum 4.9 2.3 - 6.6 mg/dL   Sedimentation rate     Status: None   Collection Time: 02/06/15 10:51 AM  Result Value Ref Range   Sed Rate 4 0 - 22 mm/hr  C-reactive protein     Status: Abnormal   Collection Time: 02/06/15 10:51 AM  Result Value Ref Range   CRP 1.1 (H) <1.0 mg/dL  Renal function panel     Status: Abnormal   Collection Time: 02/07/15  5:01 PM  Result Value Ref Range   Sodium 137 135 - 145 mmol/L   Potassium 3.7 3.5 - 5.1 mmol/L   Chloride 100 (L) 101 - 111 mmol/L   CO2 28 22 - 32 mmol/L   Glucose, Bld 65 65 - 99 mg/dL   BUN 12 6 - 20 mg/dL   Creatinine, Ser 6.13 (H) 0.44 - 1.00 mg/dL   Calcium 8.8 (L) 8.9 - 10.3 mg/dL   Phosphorus 4.2 2.5 - 4.6 mg/dL   Albumin 1.9 (L) 3.5 - 5.0 g/dL   GFR calc non Af Amer 8 (L) >60 mL/min   GFR calc Af Amer 9 (L) >60 mL/min    Comment: (NOTE) The eGFR has been calculated using the CKD EPI equation. This calculation has not been validated in all clinical situations. eGFR's persistently <  60 mL/min signify possible Chronic Kidney Disease.    Anion gap 9 5 - 15  CBC     Status: Abnormal   Collection Time: 02/07/15  5:01 PM  Result Value Ref Range   WBC 8.6 4.0 - 10.5 K/uL   RBC 3.02 (L) 3.87 - 5.11 MIL/uL   Hemoglobin 8.8 (L) 12.0 - 15.0 g/dL   HCT 28.5 (L) 36.0 - 46.0 %   MCV 94.4 78.0 - 100.0 fL   MCH 29.1 26.0 - 34.0 pg   MCHC 30.9 30.0 - 36.0 g/dL   RDW 19.1 (H) 11.5 - 15.5 %   Platelets 135 (L) 150 - 400 K/uL     HEENT: normal Cardio: RRR and no murmur Resp: CTA B/L and unlabored GI: BS positive and NT, ND Extremity:  Pulses positive and No Edema Skin:   Wound Left thigh swollen aroung graft, no drainage from access site, mildly tender Neuro: Alert/Oriented, Normal Sensory and Abnormal Motor 4/5 Left delt, bi, tri, grip, 3- L hip flex, knee xt, ankle DF/PF Musc/Skel:  Swelling Left knee, no jt line tenderness, has L thigh swelling as well Gen NAD   Assessment/Plan: 1. Functional deficits secondary to right basal ganglia infarct with  increased LLE weakness and debility from recent bacteremia which require 3+ hours per day of interdisciplinary therapy in a comprehensive inpatient rehab setting. Physiatrist is providing close team supervision and 24 hour management of active medical problems listed below. Physiatrist and rehab team continue to assess barriers to discharge/monitor patient progress toward functional and medical goals. FIM: Function - Bathing Bathing activity did not occur: Refused Position: Sitting EOB Body parts bathed by patient: Right arm, Left arm, Chest, Abdomen, Right upper leg, Left upper leg Body parts bathed by helper: Back, Front perineal area, Buttocks Bathing not applicable: Left lower leg, Right lower leg  Function- Upper Body Dressing/Undressing What is the patient wearing?: Hospital gown Pull over shirt/dress - Perfomed by patient: Thread/unthread right sleeve, Thread/unthread left sleeve, Put head through opening, Pull shirt over trunk Assist Level: Set up Set up : To obtain clothing/put away Function - Lower Body Dressing/Undressing What is the patient wearing?: Pants Position: Bed Underwear - Performed by helper: Thread/unthread right underwear leg, Thread/unthread left underwear leg, Pull underwear up/down Pants- Performed by patient: Thread/unthread right pants leg, Thread/unthread left pants leg, Pull pants up/down, Fasten/unfasten pants Non-skid slipper socks- Performed by helper: Don/doff right sock, Don/doff left sock Assist for footwear: Setup Assist for lower body dressing: Touching or steadying assistance (Pt > 75%)  Function - Toileting Toileting steps completed by helper: Adjust clothing prior to toileting, Performs perineal hygiene, Adjust clothing after toileting Assist level: Two helpers (At [facility])  Function - Air cabin crew transfer activity did not occur: N/A Toilet transfer assistive device: Bedside commode Assist level to bedside commode (at bedside):  Moderate assist (Pt 50 - 74%/lift or lower) Assist level from bedside commode (at bedside): Moderate assist (Pt 50 - 74%/lift or lower)  Function - Chair/bed transfer Chair/bed transfer method: Stand pivot Chair/bed transfer assist level: Maximal assist (Pt 25 - 49%/lift and lower) Chair/bed transfer assistive device: Other (rollator) Chair/bed transfer details: Verbal cues for precautions/safety, Verbal cues for safe use of DME/AE  Function - Locomotion: Wheelchair Will patient use wheelchair at discharge?: Yes Type: Manual Max wheelchair distance: 35 Assist Level: Supervision or verbal cues Wheel 50 feet with 2 turns activity did not occur: Safety/medical concerns Wheel 150 feet activity did not occur: Safety/medical concerns Turns around,maneuvers to  table,bed, and toilet,negotiates 3% grade,maneuvers on rugs and over doorsills: No Function - Locomotion: Ambulation Assistive device: Other (comment) (Rollator) Max distance: 40 Assist level: 2 helpers Assist level: Touching or steadying assistance (Pt > 75%) Walk 50 feet with 2 turns activity did not occur: Safety/medical concerns Walk 150 feet activity did not occur: Safety/medical concerns Walk 10 feet on uneven surfaces activity did not occur: Safety/medical concerns  Function - Comprehension Comprehension: Auditory Comprehension assist level: Follows basic conversation/direction with extra time/assistive device  Function - Expression Expression: Verbal Expression assist level: Expresses complex 90% of the time/cues < 10% of the time  Function - Social Interaction Social Interaction assist level: Interacts appropriately with others with medication or extra time (anti-anxiety, antidepressant).  Function - Problem Solving Problem solving assist level: Solves basic 50 - 74% of the time/requires cueing 25 - 49% of the time  Function - Memory Memory assist level: Recognizes or recalls less than 25% of the time/requires cueing  greater than 75% of the time Patient normally able to recall (first 3 days only): Current season, Staff names and faces, That he or she is in a hospital  Medical Problem List and Plan: 1. Functional deficits secondary to right basal ganglia infarct and debility from recent bacteremia.  Increased weakness noted in LLE ? Related to decondtioning vs new infarct 2.  DVT Prophylaxis/Anticoagulation: Chronic Coumadin on hold until antibiotics completed 02/09/2015 3. Pain Management: Oxycodone and Flexeril as needed. Monitor with increased mobility, left knee pain, - xray, some erythema,appreciate ortho note, doubt septic jt, exam today appears more extra articular, ? ITB syndrome, improving 4. ID/Pseudomonas sepsis. Continue Tressie Ellis 02/09/2015 5. Neuropsych: This patient is capable of making decisions on her own behalf. 6. Skin/Wound Care: Routine skin checks 7. Fluids/Electrolytes/Nutrition: Routine I/Os with follow-up chemistries 8. End-stage renal disease/lupus nephritis. Continue hemodialysis as per renal services, uses new Left femoral graft for HD, Nephro to address pt's HD/TRansplant questions        9. Orthostatic hypotension.Midodrine 10 mg twice a day 10. Chronic anemia. Continue Aranesp 11. Hyperlipidemia. Lipitor 12.  Mood and affect flat- psych eval today, pt now willing to consider SSRI depression screen is positive,start low dose celexa 13.  Lupus pleuritis- d/w with nephro and pt , starting corticosteroid burst LOS (Days) 5 A FACE TO FACE EVALUATION WAS PERFORMED  KIRSTEINS,ANDREW E 02/09/2015, 9:35 AM

## 2015-02-09 NOTE — Progress Notes (Signed)
Patient ID: [name], female   DOB: [birth year], 39 y.o.   MRN: [id number]  [facility] Progress Note   Assessment/ Plan:   1. Pseudomonas sepsis: Suspected CRB leading to discontinuation of TDC and cannulation of her left thigh graft. Last dose of Fortaz at HD today. She is afebrile and without any evidence of sepsis/seeding of right thigh graft. 2. ESRD: On TTS HD schedule- next HD today. Attempting cautious UF. 3. Anemia of chronic kidney disease: hemoglobin trending down- status post high dose Aranesp at dialysis Thursday. 4. Metabolic bone disease: on renvela [facility] for control of phosphorus. Not on vitamin D receptor analogue. 5. Hypotension: Continue midodrine for blood pressure support and ultrafiltration for volume management. 6. Malnutrition/deconditioning: Continue inpatient rehabilitation with dietary nutritional supplementation 7. Chest pain: appears pleuritic and likely associated with lupus- will treat with pulse of PO prednisone.   Subjective:   She reports SSCP that does not radiate or cause diaphoresis but is worsened by taking deep breath and makes her splint during rehab exercises. CXR done earlier shows a small left pleural effusion.    Objective:   BP 118/67 mmHg  Pulse 100  Temp(Src) 98.3 F (36.8 C) (Oral)  Resp 18  Wt 92.9 kg (204 lb 12.9 oz)  SpO2 99%  LMP 01/21/2015  Physical Exam: Gen: Resting sitting up in recliner CVS: Pulse regular tachycardia, S1 and S2 normal Resp: Clear to auscultation, no rales- chest pain not reproduced by palpation Abd: Soft, obese, nontender Ext: Decreasing edema over left thigh AVG. No pretibial/ankle edema   Labs: BMET  Recent Labs Lab 02/03/15 0302 02/04/15 0415 02/04/15 1930 02/07/15 1701  NA 137 136 136 137  K 3.9 3.5 3.7 3.7  CL 100* 102 99* 100*  CO2 30 28 27 28   GLUCOSE 79 75 79 65  BUN 9 14 17 12   CREATININE 4.19* 5.74* 6.39* 6.13*  CALCIUM 8.3* 8.3* 8.6* 8.8*  PHOS 3.3 4.4 5.0* 4.2    CBC  Recent Labs Lab 02/04/15 1930 02/06/15 1051 02/07/15 1701  WBC 11.7* 7.7 8.6  NEUTROABS  --  6.8  --   HGB 12.1 9.3* 8.8*  HCT 38.1 29.3* 28.5*  MCV 94.3 94.8 94.4  PLT 127* 100* 135*   Medications:    . atorvastatin  20 mg Oral q1800  . cefTAZidime (FORTAZ)  IV  2 g Intravenous Q T,Th,Sa-HD  . darbepoetin (ARANESP) injection - DIALYSIS  200 mcg Intravenous Q Thu-HD  . feeding supplement (PRO-STAT SUGAR FREE 64)  30 mL Oral BID  . hydroxychloroquine  400 mg Oral Daily  . metoprolol succinate  25 mg Oral QHS  . midodrine  10 mg Oral BID WC  . multivitamin  1 tablet Oral QHS  . pantoprazole  40 mg Oral Daily  . sevelamer carbonate  1,600 mg Oral TID WC   Elmarie Shiley, MD 02/09/2015, 9:00 AM

## 2015-02-09 NOTE — Progress Notes (Signed)
Pt verbalizes that she feels she is not able to do rehab at the activity level they are expecting of her at this time. Has dialysis following her therapy and feels that she gets to a point that she feels her body is so tired that it starts shutting down. Has had problems midway into her hemodialysis Tx during the last 2 treatments she has had. (hypotension, tachycardia, shortness of breath, chest pain). States that she knows her physical therapy is very important for her to get better but is not sure she is able to do it at the level she feels they are expecting at this time.

## 2015-02-10 ENCOUNTER — Inpatient Hospital Stay (HOSPITAL_COMMUNITY): Payer: Medicaid Other | Admitting: Physical Therapy

## 2015-02-10 DIAGNOSIS — F320 Major depressive disorder, single episode, mild: Secondary | ICD-10-CM

## 2015-02-10 MED ORDER — CALCIUM CARBONATE ANTACID 500 MG PO CHEW
1.0000 | CHEWABLE_TABLET | Freq: Four times a day (QID) | ORAL | Status: DC | PRN
  Administered 2015-02-10 – 2015-02-11 (×3): 400 mg via ORAL
  Administered 2015-02-12: 200 mg via ORAL
  Administered 2015-02-13 – 2015-02-17 (×6): 400 mg via ORAL
  Filled 2015-02-10: qty 2
  Filled 2015-02-10: qty 1
  Filled 2015-02-10: qty 2
  Filled 2015-02-10: qty 1
  Filled 2015-02-10 (×7): qty 2

## 2015-02-10 NOTE — Progress Notes (Signed)
Patient ID: [name], female   DOB: [birth year], 39 y.o.   MRN: [id number]  [facility] Progress Note   Assessment/ Plan:   1. Pseudomonas sepsis: Likely CRB leading to removal of TDC and use of her left thigh AVG. Last dose of Fortaz at HD yesterday. Without any evidence of sepsis/seeding of right thigh graft. 2. ESRD: On TTS HD schedule- she did not tolerate yesterday's dialysis well with intradialytic hypotension/tachycardia and postdialysis nausea/fatigue. Will exercise caution with ultrafiltration. She still makes a little urine. Next hemodialysis Tuesday (no acute needs at this time) 3. Anemia of chronic kidney disease: hemoglobin trending down- status post high dose Aranesp at dialysis Thursday. 4. Metabolic bone disease: on renvela [facility] for control of phosphorus. Not on vitamin D receptor analogue. 5. Hypotension: Continue midodrine for blood pressure support and ultrafiltration for volume management. 6. Malnutrition/deconditioning: Continue inpatient rehabilitation with dietary nutritional supplementation 7. Chest pain: appears pleuritic and likely associated with lupus- begin prednisone yesterday (20 mg 5 days)-she reports some improvement of symptoms.   Subjective:    Reports chest pain comes and goes but overall better on prednisone. Experience low blood pressure during dialysis with nausea/fatigue thereafter.    Objective:   BP 110/52 mmHg  Pulse 88  Temp(Src) 98.6 F (37 C) (Oral)  Resp 18  Wt 86.32 kg (190 lb 4.8 oz)  SpO2 100%  LMP 01/21/2015  Physical Exam: Gen: Working with physical therapy-sitting on the edge of her bed CVS: Pulse regular tachycardia, S1 and S2 normal Resp: Clear to auscultation, no rales/rhonchi Abd: Soft, obese, nontender Ext: Some persistence of edema over left thigh AVG-no erythema/tenderness. No pretibial/ankle edema   Labs: BMET  Recent Labs Lab 02/04/15 0415 02/04/15 1930 02/07/15 1701 02/09/15 1816  NA 136 136  137 139  K 3.5 3.7 3.7 3.9  CL 102 99* 100* 102  CO2 28 27 28 30   GLUCOSE 75 79 65 64*  BUN 14 17 12 8   CREATININE 5.74* 6.39* 6.13* 5.45*  CALCIUM 8.3* 8.6* 8.8* 9.0  PHOS 4.4 5.0* 4.2 3.0   CBC  Recent Labs Lab 02/04/15 1930 02/06/15 1051 02/07/15 1701 02/09/15 1816  WBC 11.7* 7.7 8.6 7.5  NEUTROABS  --  6.8  --   --   HGB 12.1 9.3* 8.8* 8.3*  HCT 38.1 29.3* 28.5* 27.7*  MCV 94.3 94.8 94.4 96.5  PLT 127* 100* 135* 149*   Medications:    . atorvastatin  20 mg Oral q1800  . citalopram  10 mg Oral Daily  . darbepoetin (ARANESP) injection - DIALYSIS  200 mcg Intravenous Q Thu-HD  . feeding supplement (PRO-STAT SUGAR FREE 64)  30 mL Oral BID  . hydroxychloroquine  400 mg Oral Daily  . metoprolol succinate  25 mg Oral QHS  . midodrine  10 mg Oral BID WC  . multivitamin  1 tablet Oral QHS  . pantoprazole  40 mg Oral Daily  . predniSONE  20 mg Oral Q breakfast  . sevelamer carbonate  1,600 mg Oral TID WC   Elmarie Shiley, MD 02/10/2015, 10:25 AM

## 2015-02-10 NOTE — Progress Notes (Signed)
Physical Therapy Session Note  Patient Details  Name: [name] MRN: [id number] Date of Birth: October 23, 1975  Today's Date: 02/10/2015 PT Individual Time: 1000-1100 PT Individual Time Calculation (min): 60 min   Short Term Goals: Week 1:  PT Short Term Goal 1 (Week 1): pt will move supine> sitting with min assist PT Short Term Goal 2 (Week 1): pt will move stand> sit with min assist  PT Short Term Goal 3 (Week 1): pt will propel w/c x 50' with superviison PT Short Term Goal 4 (Week 1): pt will perform gait x 20' with assist of 1 person and LRAD PT Short Term Goal 5 (Week 1): pt will tolerate static stander x 15 minutes  Skilled Therapeutic Interventions/Progress Updates:    Pt continues to be limited by pain and anxiety. Pt demonstrates improvement requiring decreased assist for sit to stand transfers from bed and w/c. Pt demonstrates improvement initiating car transfers, but limited by decreased seat height. Pt would continue to benefit from skilled PT services to increase functional mobility.  Therapy Documentation Precautions:  Precautions Precautions: Fall Precaution Comments: Pt's femoral cath is tunneled therefore she is able to get OOB; L knee joint pain Restrictions Weight Bearing Restrictions: No Pain: Pain Assessment Pain Assessment: 0-10 Pain Score: 2  Mobility:  Min A for bed to w/c transfers Locomotion :   14' Min A with rollator with cues for pacing and posture Other Treatments:   Pt educated on rehab plan, safety in mobility, anticipated disposition, rehab progress, and progressing activity. Pt performs static standing balance 1'x3. Pt performs transfers x6 in session. Pt performs reaching in standing x2 trials. Pt performs diaphragmatic breathing 2x2'. Pt worried about bump in her stomach, RN made aware.   See Function Navigator for Current Functional Status.   Therapy/Group: Individual Therapy  Monia Pouch 02/10/2015, 11:01 AM

## 2015-02-10 NOTE — Progress Notes (Addendum)
Subjective/Complaints: No CP c/o, nausea today, had a "bad HD session" yesterday states BP dropped ROS:  No sweats chills, , no other jt pains Received alb in HD Celexa started yest Objective: Vital Signs: Blood pressure 110/52, pulse 88, temperature 98.6 F (37 C), temperature source Oral, resp. rate 18, weight 86.32 kg (190 lb 4.8 oz), last menstrual period 01/21/2015, SpO2 100 %. Dg Chest 2 View  02/09/2015  CLINICAL DATA:  Sharp chest pain EXAM: CHEST  2 VIEW COMPARISON:  01/17/2015 FINDINGS: Possible small left pleural effusion. No focal consolidation. No pneumothorax. Mild cardiomegaly. Visualized osseous structures are within normal limits. IMPRESSION: Possible small left pleural effusion. Electronically Signed   By: Julian Hy M.D.   On: 02/09/2015 08:46   Results for orders placed or performed during the hospital encounter of 02/04/15 (from the past 72 hour(s))  Renal function panel     Status: Abnormal   Collection Time: 02/07/15  5:01 PM  Result Value Ref Range   Sodium 137 135 - 145 mmol/L   Potassium 3.7 3.5 - 5.1 mmol/L   Chloride 100 (L) 101 - 111 mmol/L   CO2 28 22 - 32 mmol/L   Glucose, Bld 65 65 - 99 mg/dL   BUN 12 6 - 20 mg/dL   Creatinine, Ser 6.13 (H) 0.44 - 1.00 mg/dL   Calcium 8.8 (L) 8.9 - 10.3 mg/dL   Phosphorus 4.2 2.5 - 4.6 mg/dL   Albumin 1.9 (L) 3.5 - 5.0 g/dL   GFR calc non Af Amer 8 (L) >60 mL/min   GFR calc Af Amer 9 (L) >60 mL/min    Comment: (NOTE) The eGFR has been calculated using the CKD EPI equation. This calculation has not been validated in all clinical situations. eGFR's persistently <60 mL/min signify possible Chronic Kidney Disease.    Anion gap 9 5 - 15  CBC     Status: Abnormal   Collection Time: 02/07/15  5:01 PM  Result Value Ref Range   WBC 8.6 4.0 - 10.5 K/uL   RBC 3.02 (L) 3.87 - 5.11 MIL/uL   Hemoglobin 8.8 (L) 12.0 - 15.0 g/dL   HCT 28.5 (L) 36.0 - 46.0 %   MCV 94.4 78.0 - 100.0 fL   MCH 29.1 26.0 - 34.0 pg    MCHC 30.9 30.0 - 36.0 g/dL   RDW 19.1 (H) 11.5 - 15.5 %   Platelets 135 (L) 150 - 400 K/uL  Renal function panel     Status: Abnormal   Collection Time: 02/09/15  6:16 PM  Result Value Ref Range   Sodium 139 135 - 145 mmol/L   Potassium 3.9 3.5 - 5.1 mmol/L   Chloride 102 101 - 111 mmol/L   CO2 30 22 - 32 mmol/L   Glucose, Bld 64 (L) 65 - 99 mg/dL   BUN 8 6 - 20 mg/dL   Creatinine, Ser 5.45 (H) 0.44 - 1.00 mg/dL   Calcium 9.0 8.9 - 10.3 mg/dL   Phosphorus 3.0 2.5 - 4.6 mg/dL   Albumin 1.8 (L) 3.5 - 5.0 g/dL   GFR calc non Af Amer 9 (L) >60 mL/min   GFR calc Af Amer 10 (L) >60 mL/min    Comment: (NOTE) The eGFR has been calculated using the CKD EPI equation. This calculation has not been validated in all clinical situations. eGFR's persistently <60 mL/min signify possible Chronic Kidney Disease.    Anion gap 7 5 - 15  CBC     Status: Abnormal  Collection Time: 02/09/15  6:16 PM  Result Value Ref Range   WBC 7.5 4.0 - 10.5 K/uL   RBC 2.87 (L) 3.87 - 5.11 MIL/uL   Hemoglobin 8.3 (L) 12.0 - 15.0 g/dL   HCT 27.7 (L) 36.0 - 46.0 %   MCV 96.5 78.0 - 100.0 fL   MCH 28.9 26.0 - 34.0 pg   MCHC 30.0 30.0 - 36.0 g/dL   RDW 18.7 (H) 11.5 - 15.5 %   Platelets 149 (L) 150 - 400 K/uL     HEENT: normal Cardio: RRR and no murmur Resp: CTA B/L and unlabored GI: BS positive and NT, ND Extremity:  Pulses positive and No Edema Skin:   Wound Left thigh swollen aroung graft, no drainage from access site, mildly tender Neuro: Alert/Oriented, Normal Sensory and Abnormal Motor 4/5 Left delt, bi, tri, grip, 3- L hip flex, knee xt, ankle DF/PF Musc/Skel:  Swelling Left knee, no jt line tenderness, has L thigh swelling as well Gen NAD   Assessment/Plan: 1. Functional deficits secondary to right basal ganglia infarct with increased LLE weakness and debility from recent bacteremia which require 3+ hours per day of interdisciplinary therapy in a comprehensive inpatient rehab setting. Physiatrist  is providing close team supervision and 24 hour management of active medical problems listed below. Physiatrist and rehab team continue to assess barriers to discharge/monitor patient progress toward functional and medical goals. FIM: Function - Bathing Bathing activity did not occur: Refused Position: Sitting EOB Body parts bathed by patient: Right arm, Left arm, Chest, Abdomen, Right upper leg, Left upper leg Body parts bathed by helper: Back, Front perineal area, Buttocks Bathing not applicable: Left lower leg, Right lower leg  Function- Upper Body Dressing/Undressing What is the patient wearing?: Pull over shirt/dress Pull over shirt/dress - Perfomed by patient: Thread/unthread right sleeve, Thread/unthread left sleeve, Put head through opening, Pull shirt over trunk Assist Level: Set up Set up : To obtain clothing/put away Function - Lower Body Dressing/Undressing What is the patient wearing?: Pants, Non-skid slipper socks, Ted Hose Position: Sitting EOB Underwear - Performed by helper: Thread/unthread right underwear leg, Thread/unthread left underwear leg, Pull underwear up/down Pants- Performed by patient: Thread/unthread right pants leg, Thread/unthread left pants leg, Pull pants up/down Non-skid slipper socks- Performed by helper: Don/doff right sock, Don/doff left sock TED Hose - Performed by helper: Don/doff left TED hose Assist for footwear: Setup Assist for lower body dressing: Touching or steadying assistance (Pt > 75%)  Function - Toileting Toileting activity did not occur: Safety/medical concerns Toileting steps completed by patient: Adjust clothing prior to toileting, Performs perineal hygiene, Adjust clothing after toileting Toileting steps completed by helper: Performs perineal hygiene, Adjust clothing prior to toileting, Adjust clothing after toileting Assist level: Two helpers (At [facility])  Function - Air cabin crew transfer activity did not occur:  N/A Toilet transfer assistive device: Bedside commode Assist level to bedside commode (at bedside): Moderate assist (Pt 50 - 74%/lift or lower) Assist level from bedside commode (at bedside): Moderate assist (Pt 50 - 74%/lift or lower)  Function - Chair/bed transfer Chair/bed transfer method: Stand pivot Chair/bed transfer assist level: Supervision or verbal cues Chair/bed transfer assistive device: Bedrails, Armrests Chair/bed transfer details: Verbal cues for precautions/safety, Verbal cues for safe use of DME/AE  Function - Locomotion: Wheelchair Will patient use wheelchair at discharge?: Yes Type: Manual Max wheelchair distance: 35 Assist Level: Supervision or verbal cues Wheel 50 feet with 2 turns activity did not occur: Safety/medical concerns Wheel 150 feet  activity did not occur: Safety/medical concerns Turns around,maneuvers to table,bed, and toilet,negotiates 3% grade,maneuvers on rugs and over doorsills: No Function - Locomotion: Ambulation Assistive device: Walker-rolling (rollator) Max distance: 35 Assist level: Touching or steadying assistance (Pt > 75%) Assist level: Touching or steadying assistance (Pt > 75%) Walk 50 feet with 2 turns activity did not occur: Safety/medical concerns Walk 150 feet activity did not occur: Safety/medical concerns Walk 10 feet on uneven surfaces activity did not occur: Safety/medical concerns  Function - Comprehension Comprehension: Auditory Comprehension assist level: Follows basic conversation/direction with extra time/assistive device  Function - Expression Expression: Verbal Expression assist level: Expresses complex 90% of the time/cues < 10% of the time  Function - Social Interaction Social Interaction assist level: Interacts appropriately with others with medication or extra time (anti-anxiety, antidepressant).  Function - Problem Solving Problem solving assist level: Solves basic 50 - 74% of the time/requires cueing 25 - 49%  of the time  Function - Memory Memory assist level: Recognizes or recalls less than 25% of the time/requires cueing greater than 75% of the time Patient normally able to recall (first 3 days only): Current season, Staff names and faces, That he or she is in a hospital  Medical Problem List and Plan: 1. Functional deficits secondary to right basal ganglia infarct and debility from recent bacteremia.  Increased weakness noted in LLE ? Related to decondtioning vs new infarct 2.  DVT Prophylaxis/Anticoagulation: Chronic Coumadin on hold until antibiotics completed 02/09/2015, D/C summ states coumadin to be restarted 2 wks after treatment with abx, will clarify with IM, given dropping Hgb  Check stool guaic 3. Pain Management: Oxycodone and Flexeril as needed.  4. ID/Pseudomonas sepsis. Completed Tressie Ellis 02/09/2015, monitor temp, WBC will go up with steroid burst 5. Neuropsych: This patient is capable of making decisions on her own behalf. 6. Skin/Wound Care: Routine skin checks 7. Fluids/Electrolytes/Nutrition: Routine I/Os with follow-up chemistries 8. End-stage renal disease/lupus nephritis. Continue hemodialysis as per renal services, uses new Left femoral graft for HD, Nephro to address pt's HD/TRansplant questions        9. Orthostatic hypotension.Midodrine 10 mg twice a day 10. Chronic anemia. Continue Aranesp 11. Hyperlipidemia. Lipitor 12.  Mood and affect flat- psych eval today, pt now willing to consider SSRI depression screen is positive,start low dose celexa, may cause nausea 13.  Lupus pleuritis- d/w with nephro and pt , starting corticosteroid burst LOS (Days) 6 A FACE TO FACE EVALUATION WAS PERFORMED  KIRSTEINS,ANDREW E 02/10/2015, 7:41 AM

## 2015-02-11 ENCOUNTER — Inpatient Hospital Stay (HOSPITAL_COMMUNITY): Payer: Medicaid Other | Admitting: Occupational Therapy

## 2015-02-11 ENCOUNTER — Inpatient Hospital Stay (HOSPITAL_COMMUNITY): Payer: Medicaid Other

## 2015-02-11 ENCOUNTER — Inpatient Hospital Stay (HOSPITAL_COMMUNITY): Payer: Medicaid Other | Admitting: Physical Therapy

## 2015-02-11 NOTE — Progress Notes (Signed)
Physical Therapy Session Note  Patient Details  Name: [name] MRN: [id number] Date of Birth: September 18, 1975  Today's Date: 02/11/2015 PT Individual Time: 1440-1540 PT Individual Time Calculation (min): 60 min   Short Term Goals: Week 2:  PT Short Term Goal 1 (Week 2): = LTG due to estimated LOS  Skilled Therapeutic Interventions/Progress Updates:  Pt nauseated, and has had diarrhea recently with soiling.  Pt reported MD believes her GI problems may be due to new med she is on for Lupus exacerbation.  Pt declined getting OOB but agreed to bedside tx. PT consulted RN who has ordered Immodium; pt already had Zofran.  Pt stated Zofran did not help nausea at all.  Pt performed HEP with hand out,  of bil hip internal rotation, L/R short arc quad knee ext, with 5 second hold at end range, and R/L heel slides, R/L ankle alphabet, bil bridging, modified abdominal crunches  for neuro re-ed.    Bed mobility training for rolling R/L using momentum. X 5 to R and x 5 to L with min cues first trial. Pt reported that her bed at home is very high; PT recommended that her mother measure bed and car set height.  Pt left supine in bed, with all needs within reach.  Bed alarm set.     Therapy Documentation Precautions:  Precautions Precautions: Fall Precaution Comments: Pt's femoral cath is tunneled therefore she is able to get OOB; L knee joint pain Restrictions Weight Bearing Restrictions: No   Pain: no pain reported; nauseated.  RN aware.        See Function Navigator for Current Functional Status.   Therapy/Group: Individual Therapy  [name] 02/11/2015, 4:07 PM

## 2015-02-11 NOTE — Progress Notes (Signed)
Physical Therapy Note  Patient Details  Name: [name] MRN: [id number] Date of Birth: [birth year] Today's Date: 02/11/2015    Time: [id number] minutes  1:1 No c/o pain.  Pt wanted to work on bed mobility, rolling R and L in bed.  Pt performed multiple attempts with supervision to roll L, min A to roll right. Pt improved with tactile cues for core mm activation, verbal and visual cues for sequencing.  Multiple sit to stands from rollator and standing tolerance up to 80 seconds for bathing tasks.  Pt requires frequent seated rest breaks due to fatigue, HR 109-110bpm throughout bathing tasks.  Gait with rollator 6 x 20' with close supervision, cues for safety with locking rollator brakes.  Seated UE exercises per pt request 2 x 10 rowing, biceps, chest press, horiz abd/add, circles.  At end of session pt requires mod A for sit to stand, multiple attempts to rise due to fatigue.  Pt transferred to bed at end of session with mod A.  Pt motivated but limited by fatigue.   [name] 02/11/2015, 8:56 AM

## 2015-02-11 NOTE — Progress Notes (Signed)
Occupational Therapy Session Note  Patient Details  Name: [name] MRN: [id number] Date of Birth: [birth year]  Today's Date: 02/11/2015 OT Individual Time: 1100-1158 OT Individual Time Calculation (min): 58 min    Short Term Goals: Week 1:  OT Short Term Goal 1 (Week 1): Pt will perform LB bathing sit to stand with min assist and AE.  OT Short Term Goal 2 (Week 1): Pt will perform LB dressing sit to stand with min assist and AE PRN. OT Short Term Goal 3 (Week 1): Pt will perform toilet transfers with min assist 3 consecutive sessions. OT Short Term Goal 4 (Week 1): Pt will perform walk-in shower transfer with min assist using the RW.  OT Short Term Goal 5 (Week 1): Pt will perform BUE light resistance exercise with supervision following written exercise plan.    Skilled Therapeutic Interventions/Progress Updates:  Upon entering the room, pt supine in bed with basin in lap. Pt with c/o nausea and reports, "I threw up a little while ago." Pt declined exiting bed this session but had many questions and concerns regarding discharge. OT educated pt on use of momentum along with bring UE forward first and then LEs for rolling L <>R in bed. Pt able to roll both directions without use of bedrail x 2 reps each with supervision. Pt requiring mod verbal cues for technique in order to be successful. Pt requiring rest break secondary to fatigue with movement. OT gave pt pen and paper to write down questions for staff throughout day as she is anxious about discharge. OT educated and demonstrated use of green,level 3 theraband for B UE strengthening. Pt returning with 2 reps of 10 bicep curls,chest pulls, shoulder elevation, and alternating punches with min verbal cues for technique. Pt remained supine in bed at end of session with call bell and all needed items within reach upon exiting the room.   Therapy Documentation Precautions:  Precautions Precautions: Fall Precaution Comments: Pt's femoral  cath is tunneled therefore she is able to get OOB; L knee joint pain Restrictions Weight Bearing Restrictions: No Pain: Pain Assessment Pain Assessment: No/denies pain Pain Score: 0-No pain  See Function Navigator for Current Functional Status.   Therapy/Group: Individual Therapy  Phineas Semen 02/11/2015, 12:02 PM

## 2015-02-11 NOTE — Progress Notes (Signed)
  [facility] Progress Note   Subjective: "I'm getting stronger"  Filed Vitals:   02/10/15 0554 02/10/15 1408 02/10/15 2111 02/11/15 0600  BP: 110/52 164/77 134/74 128/67  Pulse: 88 85 91 77  Temp: 98.6 F (37 C) 98.8 F (37.1 C)  98.2 F (36.8 C)  TempSrc: Oral Oral  Oral  Resp: 18 18  18   Weight: 86.32 kg (190 lb 4.8 oz)   86.773 kg (191 lb 4.8 oz)  SpO2: 100% 99%  100%   Exam: Alert, no distress, deconditioned No jvd Chest clear bilat to bases RRR no MRG Abd soft obese ntnd no ascites L thigh edem 2-3+, +bruit over AVG RLE 1+ edema Neuro gen weak, nf, ox 3  [facility] TTS  4h  87.5kg  400/800  2/2 Bath  L thigh AVG (TDC removed)   Heparin 2600 Aranesp 200/wk   Assessment: 1 Pseudomonas HD catheter sepsis - cath removed, s/p Fortaz IV 2 ESRD TTS HD 3 Anemia on aranesp 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, poss Libman-Sacks) - rx emp Vanc Sept 16 5 Hx R MCA CVA sept 16 6 Volume at dry wt 7 L thigh edema - post AVG side effect, observe 8 Debility on rehab 9 MBD cont binder 10 SLE on pred 20 11 Hypotension on midodrine 12 Hx SVT on MTP 25/d  Plan - HD tomorrow, UF 2kg   Kelly Splinter MD [facility] pager [phone]    cell [phone] 02/11/2015, 11:36 AM    Recent Labs Lab 02/04/15 1930 02/07/15 1701 02/09/15 1816  NA 136 137 139  K 3.7 3.7 3.9  CL 99* 100* 102  CO2 27 28 30   GLUCOSE 79 65 64*  BUN 17 12 8   CREATININE 6.39* 6.13* 5.45*  CALCIUM 8.6* 8.8* 9.0  PHOS 5.0* 4.2 3.0    Recent Labs Lab 02/04/15 1930 02/07/15 1701 02/09/15 1816  ALBUMIN 1.8* 1.9* 1.8*    Recent Labs Lab 02/06/15 1051 02/07/15 1701 02/09/15 1816  WBC 7.7 8.6 7.5  NEUTROABS 6.8  --   --   HGB 9.3* 8.8* 8.3*  HCT 29.3* 28.5* 27.7*  MCV 94.8 94.4 96.5  PLT 100* 135* 149*   . atorvastatin  20 mg Oral q1800  . citalopram  10 mg Oral Daily  . darbepoetin (ARANESP) injection - DIALYSIS  200 mcg Intravenous Q Thu-HD  . feeding supplement (PRO-STAT  SUGAR FREE 64)  30 mL Oral BID  . hydroxychloroquine  400 mg Oral Daily  . metoprolol succinate  25 mg Oral QHS  . midodrine  10 mg Oral BID WC  . multivitamin  1 tablet Oral QHS  . pantoprazole  40 mg Oral Daily  . predniSONE  20 mg Oral Q breakfast  . sevelamer carbonate  1,600 mg Oral TID WC     acetaminophen **OR** acetaminophen, calcium carbonate, cyclobenzaprine, heparin, loperamide, ondansetron **OR** ondansetron (ZOFRAN) IV, oxyCODONE-acetaminophen, polyethylene glycol, sorbitol

## 2015-02-11 NOTE — Progress Notes (Signed)
Physical Therapy Weekly Progress Note  Patient Details  Name: [name] MRN: [id number] Date of Birth: November 04, 1975  Beginning of progress report period: February 05, 2015 End of progress report period: February 11, 2015   Patient has met 1 of 5 short term goals.  Pt continues to struggle with endurance deficit, requires supervision when rested, mod A when fatigued. Pt is improving standing and gait endurance, continues to be limited by LE and core weakness and decreased mm endurance.  Patient continues to demonstrate the following deficits: decreased strength, balance, mobility and therefore will continue to benefit from skilled PT intervention to enhance overall performance with activity tolerance, balance, postural control and ability to compensate for deficits.  Patient progressing toward long term goals..  Continue plan of care.  PT Short Term Goals Week 1:  PT Short Term Goal 1 (Week 1): pt will move supine> sitting with min assist PT Short Term Goal 1 - Progress (Week 1): Progressing toward goal PT Short Term Goal 2 (Week 1): pt will move stand> sit with min assist  PT Short Term Goal 2 - Progress (Week 1): Progressing toward goal PT Short Term Goal 3 (Week 1): pt will propel w/c x 50' with superviison PT Short Term Goal 3 - Progress (Week 1): Progressing toward goal PT Short Term Goal 4 (Week 1): pt will perform gait x 20' with assist of 1 person and LRAD PT Short Term Goal 4 - Progress (Week 1): Met PT Short Term Goal 5 (Week 1): pt will tolerate static stander x 15 minutes PT Short Term Goal 5 - Progress (Week 1): Progressing toward goal Week 2:  PT Short Term Goal 1 (Week 2): = LTG due to estimated LOS  Skilled Therapeutic Interventions/Progress Updates:  Ambulation/gait training;Balance/vestibular training;Cognitive remediation/compensation;Discharge planning;Community reintegration;DME/adaptive equipment instruction;Functional electrical stimulation;Functional mobility  training;Patient/family education;Pain management;Neuromuscular re-education;Psychosocial support;Splinting/orthotics;Therapeutic Exercise;Therapeutic Activities;Stair training;UE/LE Strength taining/ROM;UE/LE Coordination activities;Wheelchair propulsion/positioning   See Function Navigator for Current Functional Status.    [name] 02/11/2015, 11:21 AM

## 2015-02-11 NOTE — Progress Notes (Addendum)
Subjective/Complaints: Pt feels good today, much brighter, giving me thumbs up, nausea improving better appetite ROS:  No sweats chills, , no other jt pains  Celexa started 11/12 Objective: Vital Signs: Blood pressure 128/67, pulse 77, temperature 98.2 F (36.8 C), temperature source Oral, resp. rate 18, weight 86.773 kg (191 lb 4.8 oz), last menstrual period 01/21/2015, SpO2 100 %. Dg Chest 2 View  02/09/2015  CLINICAL DATA:  Sharp chest pain EXAM: CHEST  2 VIEW COMPARISON:  01/17/2015 FINDINGS: Possible small left pleural effusion. No focal consolidation. No pneumothorax. Mild cardiomegaly. Visualized osseous structures are within normal limits. IMPRESSION: Possible small left pleural effusion. Electronically Signed   By: Julian Hy M.D.   On: 02/09/2015 08:46   Results for orders placed or performed during the hospital encounter of 02/04/15 (from the past 72 hour(s))  Renal function panel     Status: Abnormal   Collection Time: 02/09/15  6:16 PM  Result Value Ref Range   Sodium 139 135 - 145 mmol/L   Potassium 3.9 3.5 - 5.1 mmol/L   Chloride 102 101 - 111 mmol/L   CO2 30 22 - 32 mmol/L   Glucose, Bld 64 (L) 65 - 99 mg/dL   BUN 8 6 - 20 mg/dL   Creatinine, Ser 5.45 (H) 0.44 - 1.00 mg/dL   Calcium 9.0 8.9 - 10.3 mg/dL   Phosphorus 3.0 2.5 - 4.6 mg/dL   Albumin 1.8 (L) 3.5 - 5.0 g/dL   GFR calc non Af Amer 9 (L) >60 mL/min   GFR calc Af Amer 10 (L) >60 mL/min    Comment: (NOTE) The eGFR has been calculated using the CKD EPI equation. This calculation has not been validated in all clinical situations. eGFR's persistently <60 mL/min signify possible Chronic Kidney Disease.    Anion gap 7 5 - 15  CBC     Status: Abnormal   Collection Time: 02/09/15  6:16 PM  Result Value Ref Range   WBC 7.5 4.0 - 10.5 K/uL   RBC 2.87 (L) 3.87 - 5.11 MIL/uL   Hemoglobin 8.3 (L) 12.0 - 15.0 g/dL   HCT 27.7 (L) 36.0 - 46.0 %   MCV 96.5 78.0 - 100.0 fL   MCH 28.9 26.0 - 34.0 pg   MCHC  30.0 30.0 - 36.0 g/dL   RDW 18.7 (H) 11.5 - 15.5 %   Platelets 149 (L) 150 - 400 K/uL     HEENT: normal Cardio: RRR and no murmur Resp: CTA B/L and unlabored GI: BS positive and NT, ND Extremity:  Pulses positive and No Edema Skin:   Wound Left thigh swollen aroung graft, no drainage from access site, mildly tender Neuro: Alert/Oriented, Normal Sensory and Abnormal Motor 4/5 Left delt, bi, tri, grip, 3- L hip flex, knee xt, ankle DF/PF Musc/Skel:  Swelling Left knee, no jt line tenderness, has L thigh swelling as well Gen NAD   Assessment/Plan: 1. Functional deficits secondary to right basal ganglia infarct with increased LLE weakness and debility from recent bacteremia which require 3+ hours per day of interdisciplinary therapy in a comprehensive inpatient rehab setting. Physiatrist is providing close team supervision and 24 hour management of active medical problems listed below. Physiatrist and rehab team continue to assess barriers to discharge/monitor patient progress toward functional and medical goals. FIM: Function - Bathing Bathing activity did not occur: Refused Position: Sitting EOB Body parts bathed by patient: Right arm, Left arm, Chest, Abdomen, Right upper leg, Left upper leg Body parts bathed by  helper: Back, Front perineal area, Buttocks Bathing not applicable: Left lower leg, Right lower leg  Function- Upper Body Dressing/Undressing What is the patient wearing?: Pull over shirt/dress Pull over shirt/dress - Perfomed by patient: Thread/unthread right sleeve, Thread/unthread left sleeve, Put head through opening, Pull shirt over trunk Assist Level: Set up Set up : To obtain clothing/put away Function - Lower Body Dressing/Undressing What is the patient wearing?: Pants, Non-skid slipper socks, Ted Hose Position: Sitting EOB Underwear - Performed by helper: Thread/unthread right underwear leg, Thread/unthread left underwear leg, Pull underwear up/down Pants-  Performed by patient: Thread/unthread right pants leg, Thread/unthread left pants leg, Pull pants up/down Non-skid slipper socks- Performed by helper: Don/doff right sock, Don/doff left sock TED Hose - Performed by helper: Don/doff left TED hose Assist for footwear: Setup Assist for lower body dressing: Touching or steadying assistance (Pt > 75%)  Function - Toileting Toileting activity did not occur: Safety/medical concerns Toileting steps completed by patient: Adjust clothing prior to toileting, Performs perineal hygiene, Adjust clothing after toileting Toileting steps completed by helper: Adjust clothing prior to toileting, Performs perineal hygiene, Adjust clothing after toileting Assist level: Two helpers (At [facility])  Function - Air cabin crew transfer activity did not occur: N/A Toilet transfer assistive device: Bedside commode Assist level to bedside commode (at bedside): Moderate assist (Pt 50 - 74%/lift or lower) Assist level from bedside commode (at bedside): Moderate assist (Pt 50 - 74%/lift or lower)  Function - Chair/bed transfer Chair/bed transfer method: Stand pivot Chair/bed transfer assist level: Touching or steadying assistance (Pt > 75%) Chair/bed transfer assistive device: Bedrails, Armrests Chair/bed transfer details: Verbal cues for precautions/safety, Verbal cues for safe use of DME/AE  Function - Locomotion: Wheelchair Will patient use wheelchair at discharge?: Yes Type: Manual Max wheelchair distance: 50 Assist Level: Supervision or verbal cues Wheel 50 feet with 2 turns activity did not occur: Safety/medical concerns Wheel 150 feet activity did not occur: Safety/medical concerns Turns around,maneuvers to table,bed, and toilet,negotiates 3% grade,maneuvers on rugs and over doorsills: No Function - Locomotion: Ambulation Assistive device: Walker-rolling Max distance: 15' (, 5'x2) Assist level: Touching or steadying assistance (Pt > 75%) Assist level:  Touching or steadying assistance (Pt > 75%) Walk 50 feet with 2 turns activity did not occur: Safety/medical concerns Walk 150 feet activity did not occur: Safety/medical concerns Walk 10 feet on uneven surfaces activity did not occur: Safety/medical concerns  Function - Comprehension Comprehension: Auditory Comprehension assist level: Follows basic conversation/direction with extra time/assistive device  Function - Expression Expression: Verbal Expression assist level: Expresses complex 90% of the time/cues < 10% of the time  Function - Social Interaction Social Interaction assist level: Interacts appropriately with others with medication or extra time (anti-anxiety, antidepressant).  Function - Problem Solving Problem solving assist level: Solves basic 50 - 74% of the time/requires cueing 25 - 49% of the time  Function - Memory Memory assist level: Recognizes or recalls less than 25% of the time/requires cueing greater than 75% of the time Patient normally able to recall (first 3 days only): Current season, Staff names and faces, That he or she is in a hospital, Location of own room  Medical Problem List and Plan: 1. Functional deficits secondary to right basal ganglia infarct and debility from recent bacteremia.  Increased weakness noted in LLE ? Related to decondtioning vs new infarct 2.  DVT Prophylaxis/Anticoagulation: Chronic Coumadin on hold until antibiotics completed 02/09/2015, D/C summ states coumadin to be restarted 2 wks after treatment with abx,  will clarify with IM, given dropping Hgb  Check stool guaic, had OB + stool in Dec 3. Pain Management: Oxycodone and Flexeril as needed.  4. ID/Pseudomonas sepsis. Completed Tressie Ellis 02/09/2015, monitor temp, WBC will go up with steroid burst 5. Neuropsych: This patient is capable of making decisions on her own behalf. 6. Skin/Wound Care: Routine skin checks 7. Fluids/Electrolytes/Nutrition: Routine I/Os with follow-up  chemistries 8. End-stage renal disease/lupus nephritis. Continue hemodialysis as per renal services, uses new Left femoral graft for HD, Nephro to address pt's HD/TRansplant questions        9. Orthostatic hypotension.Midodrine 10 mg twice a day 10. Chronic anemia. Continue Aranesp 11. Hyperlipidemia. Lipitor 12.  Mood and affect depression screen is positive,started low dose celexa, may cause nausea which is improving 13.  Lupus pleuritis- d/w with nephro and pt , starting corticosteroid burst for 5 days prednisone 51m/d LOS (Days) 7 A FACE TO FACE EVALUATION WAS PERFORMED  KIRSTEINS,ANDREW E 02/11/2015, 7:24 AM

## 2015-02-12 ENCOUNTER — Inpatient Hospital Stay (HOSPITAL_COMMUNITY): Payer: Medicaid Other | Admitting: Physical Therapy

## 2015-02-12 ENCOUNTER — Inpatient Hospital Stay (HOSPITAL_COMMUNITY): Payer: Medicaid Other | Admitting: Occupational Therapy

## 2015-02-12 ENCOUNTER — Inpatient Hospital Stay (HOSPITAL_COMMUNITY): Payer: Medicaid Other

## 2015-02-12 LAB — CBC
HCT: 26.6 % — ABNORMAL LOW (ref 36.0–46.0)
HEMOGLOBIN: 8.2 g/dL — AB (ref 12.0–15.0)
MCH: 29.3 pg (ref 26.0–34.0)
MCHC: 30.8 g/dL (ref 30.0–36.0)
MCV: 95 fL (ref 78.0–100.0)
Platelets: 193 10*3/uL (ref 150–400)
RBC: 2.8 MIL/uL — ABNORMAL LOW (ref 3.87–5.11)
RDW: 17.9 % — AB (ref 11.5–15.5)
WBC: 5.6 10*3/uL (ref 4.0–10.5)

## 2015-02-12 LAB — RENAL FUNCTION PANEL
ANION GAP: 8 (ref 5–15)
Albumin: 2.1 g/dL — ABNORMAL LOW (ref 3.5–5.0)
BUN: 17 mg/dL (ref 6–20)
CALCIUM: 8.6 mg/dL — AB (ref 8.9–10.3)
CO2: 29 mmol/L (ref 22–32)
CREATININE: 6.68 mg/dL — AB (ref 0.44–1.00)
Chloride: 99 mmol/L — ABNORMAL LOW (ref 101–111)
GFR calc Af Amer: 8 mL/min — ABNORMAL LOW (ref >60)
GFR calc non Af Amer: 7 mL/min — ABNORMAL LOW (ref >60)
GLUCOSE: 75 mg/dL (ref 65–99)
Phosphorus: 4 mg/dL (ref 2.5–4.6)
Potassium: 3.9 mmol/L (ref 3.5–5.1)
Sodium: 136 mmol/L (ref 135–145)

## 2015-02-12 LAB — C DIFFICILE QUICK SCREEN W PCR REFLEX
C DIFFICILE (CDIFF) INTERP: NEGATIVE
C DIFFICILE (CDIFF) TOXIN: NEGATIVE
C DIFFICLE (CDIFF) ANTIGEN: NEGATIVE

## 2015-02-12 LAB — OCCULT BLOOD X 1 CARD TO LAB, STOOL
FECAL OCCULT BLD: NEGATIVE
FECAL OCCULT BLD: NEGATIVE
FECAL OCCULT BLD: NEGATIVE

## 2015-02-12 MED ORDER — HEPARIN SODIUM (PORCINE) 1000 UNIT/ML DIALYSIS: 1000.0000 [IU] | INTRAMUSCULAR | Status: DC | PRN

## 2015-02-12 MED ORDER — LIDOCAINE HCL (PF) 1 % IJ SOLN: 5.0000 mL | INTRAMUSCULAR | Status: DC | PRN

## 2015-02-12 MED ORDER — SODIUM CHLORIDE 0.9 % IV SOLN: 100.0000 mL | INTRAVENOUS | Status: DC | PRN

## 2015-02-12 MED ORDER — MIDODRINE HCL 5 MG PO TABS
10.0000 mg | ORAL_TABLET | ORAL | Status: DC
  Administered 2015-02-12: 10 mg via ORAL
  Filled 2015-02-12 (×2): qty 2

## 2015-02-12 MED ORDER — ALTEPLASE 2 MG IJ SOLR: 2.0000 mg | Freq: Once | INTRAMUSCULAR | Status: DC | PRN

## 2015-02-12 MED ORDER — MIDODRINE HCL 5 MG PO TABS
10.0000 mg | ORAL_TABLET | ORAL | Status: DC
  Filled 2015-02-12 (×2): qty 2

## 2015-02-12 MED ORDER — PENTAFLUOROPROP-TETRAFLUOROETH EX AERO: 1.0000 "application " | INHALATION_SPRAY | CUTANEOUS | Status: DC | PRN

## 2015-02-12 MED ORDER — HEPARIN SODIUM (PORCINE) 1000 UNIT/ML DIALYSIS: 2600.0000 [IU] | Freq: Once | INTRAMUSCULAR | Status: DC

## 2015-02-12 MED ORDER — LIDOCAINE-PRILOCAINE 2.5-2.5 % EX CREA
1.0000 "application " | TOPICAL_CREAM | CUTANEOUS | Status: DC | PRN
  Filled 2015-02-12: qty 5

## 2015-02-12 NOTE — Progress Notes (Addendum)
[facility] Progress Note  Assessment/Plan: 1. Pseudomonas sepsis: Pseudomonas HD catheter sepsis.  Catheter removed. Last dose of Fortaz 11/12. Afebrile. Using L Thigh AVG for HD.  2. ESRD: On TTS HD schedule-For HD today. Having issues with hypotension. .  3. Anemia of chronic kidney disease: 8.3. Max ESA dosing. Follow CBC. 4. Metabolic bone disease: on renvela [facility] for control of phosphorus. Not on vitamin D receptor analogue. 5. Hypotension: Continue midodrine for blood pressure support and ultrafiltration for volume management.  Discussed with pharmacy giving Midodrine within 30-1 hour prior to HD on hemodialysis days 6. Malnutrition/deconditioning: Continue inpatient rehabilitation with dietary nutritional supplementation. Albumin 1.8 7. Chest pain: appears pleuritic and likely associated with lupus- begin prednisone yesterday (20 mg 5 days)-No C/O chest pain today.  8. SLE: on prednisone 9. Volume: is at dry wt but needs lower weight most likely   Rita H. Brown NP-C 02/12/2015, 11:15 AM  [facility] [phone]  Pt seen, examined and agree w A/P as above. HD today , max UF, get vol down now that BP's have improved. May need to decrease midodrine soon.  Kelly Splinter MD [facility] pager [phone]    cell [phone] 02/12/2015, 12:52 PM    Subjective: "I'm getting better".  Patient up in chair, brushing teeth. No C/Os. Voices concerns about hypotension during HD.     Objective Filed Vitals:   02/10/15 2111 02/11/15 0600 02/11/15 1328 02/12/15 0500  BP: 134/74 128/67 160/99 133/66  Pulse: 91 77 82 83  Temp:  98.2 F (36.8 C) 98.7 F (37.1 C) 98.2 F (36.8 C)  TempSrc:  Oral Oral Oral  Resp:  18 18 17   Weight:  86.773 kg (191 lb 4.8 oz)  86.4 kg (190 lb 7.6 oz)  SpO2:  100% 95% 100%   Physical Exam General: Very pleasant female, NAD Heart: S1, S2. No M/G/R Lungs: Bilateral breath sounds CTA AP Abdomen: Abdomen soft,  non-tender Extremities: LLE edema, particularly L foot.  Dialysis Access: L thigh AVG + thrill + bruit  Dialysis Orders: [facility] TTS 4h 87.5kg 400/800 2/2 Bath L thigh AVG (TDC removed) Heparin 2600 Aranesp 200/wk  Additional Objective Labs: Basic Metabolic Panel:  Recent Labs Lab 02/07/15 1701 02/09/15 1816  NA 137 139  K 3.7 3.9  CL 100* 102  CO2 28 30  GLUCOSE 65 64*  BUN 12 8  CREATININE 6.13* 5.45*  CALCIUM 8.8* 9.0  PHOS 4.2 3.0   Liver Function Tests:  Recent Labs Lab 02/07/15 1701 02/09/15 1816  ALBUMIN 1.9* 1.8*   No results for input(s): LIPASE, AMYLASE in the last 168 hours. CBC:  Recent Labs Lab 02/06/15 1051 02/07/15 1701 02/09/15 1816  WBC 7.7 8.6 7.5  NEUTROABS 6.8  --   --   HGB 9.3* 8.8* 8.3*  HCT 29.3* 28.5* 27.7*  MCV 94.8 94.4 96.5  PLT 100* 135* 149*   Blood Culture    Component Value Date/Time   SDES BLOOD LEFT HAND 01/28/2015 1342   SPECREQUEST IN PEDIATRIC BOTTLE  3CC 01/28/2015 1342   CULT NO GROWTH 5 DAYS 01/28/2015 1342   REPTSTATUS 02/02/2015 FINAL 01/28/2015 1342    Cardiac Enzymes: No results for input(s): CKTOTAL, CKMB, CKMBINDEX, TROPONINI in the last 168 hours. CBG: No results for input(s): GLUCAP in the last 168 hours. Iron Studies: No results for input(s): IRON, TIBC, TRANSFERRIN, FERRITIN in the last 72 hours. @lablastinr3 @ Studies/Results: No results found. Medications:   . atorvastatin  20 mg Oral q1800  .  citalopram  10 mg Oral Daily  . darbepoetin (ARANESP) injection - DIALYSIS  200 mcg Intravenous Q Thu-HD  . feeding supplement (PRO-STAT SUGAR FREE 64)  30 mL Oral BID  . hydroxychloroquine  400 mg Oral Daily  . metoprolol succinate  25 mg Oral QHS  . midodrine  10 mg Oral BID WC  . multivitamin  1 tablet Oral QHS  . pantoprazole  40 mg Oral Daily  . predniSONE  20 mg Oral Q breakfast  . sevelamer carbonate  1,600 mg Oral TID WC

## 2015-02-12 NOTE — Progress Notes (Signed)
Occupational Therapy Session Note  Patient Details  Name: [name] MRN: [id number] Date of Birth: [birth year]  Today's Date: 02/12/2015 OT Individual Time: 1000-1100 and 1400-1443 OT Individual Time Calculation (min): 60 min and 43 min   Short Term Goals: Week 1:  OT Short Term Goal 1 (Week 1): Pt will perform LB bathing sit to stand with min assist and AE.  OT Short Term Goal 2 (Week 1): Pt will perform LB dressing sit to stand with min assist and AE PRN. OT Short Term Goal 3 (Week 1): Pt will perform toilet transfers with min assist 3 consecutive sessions. OT Short Term Goal 4 (Week 1): Pt will perform walk-in shower transfer with min assist using the RW.  OT Short Term Goal 5 (Week 1): Pt will perform BUE light resistance exercise with supervision following written exercise plan.    Skilled Therapeutic Interventions/Progress Updates:    Visit 1:  no c/o pain, but pt stating she is very fatigued.  Pt seen this session to facilitate functional mobility and strength to improve her independence with ADLs. Pt stated she was bathed early this morning by NT.  She did not have clean clothing available, so she remained in hospital gown. She will need AE to don socks.  Unable to don shoes due to L foot edema.  Pt very concerned this morning about being overworked on her dialysis days with therapy. Reassured pt therapy will be modified as pt needs. Pt sat to EOB and session focused on body mechanics of forward lean with squat pivots/ scooting on bed. Pt worked on "rocking forward" to wt shift forward to assist her with lifting her hips. Pt did well needing only guiding A to scoot on bed, but required mod A to squat pivot to w/c.  Pt in chair working on grooming tasks. Pt then set up with all needs met.   Visit 2:  No c/o pain, but pt is fatigued. Pt had just completed physical therapy but she was agreeable to participating in this OT session. Pt taken to gym to facilitate sit><stand skills and  standing balance/endurance to increase I in ADLs.  Pt positioned at side of parallel bars. Reviewed techniques from this morning of forward rocking to move wt forward. Pt was able to fully push up from w/c using BUE and wt shift forward to stand at parallel bars with only Supervision. Pt very happy with her accomplishment.  She could stand at bars with S but only for 1-2 min before feeling fatigued.  Pt rested and worked on AROM UE exercises. Repeated sit to stand with S and standing 1-2 min. Repeated UE exercises.  Educated pt on simple AROM pilates exercises she can do in her room. Pt requesting to get back in bed. Stood to 4WW with S and ambulated 4 feet to stand pivot to EOB with S. Sat down and needed A to bring legs into bed.  Adjusted in bed with all needs met.  Pt excited that she was able to stand three times.  Pt in bed with all needs met.    Therapy Documentation Precautions:  Precautions Precautions: Fall Precaution Comments: Pt's femoral cath is tunneled therefore she is able to get OOB; L knee joint pain Restrictions Weight Bearing Restrictions: No   Pain: Pain Assessment Pain Assessment: No/denies pain ADL:    See Function Navigator for Current Functional Status.   Therapy/Group: Individual Therapy  [place] 02/12/2015, 11:00 AM

## 2015-02-12 NOTE — Progress Notes (Signed)
Occupational Therapy Weekly Progress Note  Patient Details  Name: [name] MRN: [id number] Date of Birth: [birth year]  Beginning of progress report period: February 05, 2015 End of progress report period: February 12, 2015   Patient has met 4 of 4 short term goals.  Pt has made a great deal of progress with her sit to stand skills to allow her to stand with close S so that she is able to actively engage in her self care.   Patient continues to demonstrate the following deficits decreased endurance and standing balance, limited UE strength  and therefore will continue to benefit from skilled OT intervention to enhance overall performance with BADL.  Patient progressing toward long term goals..  Continue plan of care.  OT Short Term Goals Week 1:  OT Short Term Goal 1 (Week 1): Pt will perform LB bathing sit to stand with min assist and AE.  OT Short Term Goal 1 - Progress (Week 1): Met OT Short Term Goal 2 (Week 1): Pt will perform LB dressing sit to stand with min assist and AE PRN. OT Short Term Goal 2 - Progress (Week 1): Met OT Short Term Goal 3 (Week 1): Pt will perform toilet transfers with min assist 3 consecutive sessions. OT Short Term Goal 3 - Progress (Week 1): Met OT Short Term Goal 4 (Week 1): Pt will perform walk-in shower transfer with min assist using the RW.  OT Short Term Goal 4 - Progress (Week 1): Discontinued (comment) (Pt has new dialysis port site and does not want to shower.) OT Short Term Goal 5 (Week 1): Pt will perform BUE light resistance exercise with supervision following written exercise plan.   OT Short Term Goal 5 - Progress (Week 1): Met Week 2:  OT Short Term Goal 1 (Week 2): STG = LTGs due to remaining LOS  Skilled Therapeutic Interventions/Progress Updates:    Refer to daily OT note 02/12/15  Therapy Documentation Precautions:  Precautions Precautions: Fall Precaution Comments: Pt's femoral cath is tunneled therefore she is able to get OOB;  L knee joint pain Restrictions Weight Bearing Restrictions: No     ADL:  See Function Navigator for Current Functional Status.   [place] 02/12/2015, 3:57 PM

## 2015-02-12 NOTE — Progress Notes (Signed)
Physical Therapy Session Note  Patient Details  Name: [name] MRN: [id number] Date of Birth: 07/07/1975  Today's Date: 02/12/2015 PT Individual Time: 1305-1400 PT Individual Time Calculation (min): 55 min   Short Term Goals: Week 2:  PT Short Term Goal 1 (Week 2): = LTG due to estimated LOS  Skilled Therapeutic Interventions/Progress Updates:    Patient in bed transferred to sitting supervision and to w/c supervision.  Propelled w/c 69' S increased time and cues.  Patient ambulated with rollator x 120' minguard assist.  Verbal review of HEP and pt reports performing in room for LE strengthening.  Negotiated 6 (3") steps min A and cues for sequence with bilateral rails.  Noted HR elevated and checked with dinmap 138 bpm.  Returned to 117 with seated rest x 3+ minutes.  Then performed sit <>stand from rollator supervision x 5 reps.  Patient propelled w/c 60' supervision increased time.  Patient reports felt the best during therapy she has felt in a while.  Reports helps getting fluid off.  Left with OT for next session.  Therapy Documentation Precautions:  Precautions Precautions: Fall Precaution Comments: Pt's femoral cath is tunneled therefore she is able to get OOB; L knee joint pain Restrictions Weight Bearing Restrictions: No General:   Vital Signs:  HR max 138 with stair negotiation Pain: Pain Assessment Pain Assessment: No/denies pain   See Function Navigator for Current Functional Status.   Therapy/Group: Individual Therapy  [place], [place] 02/13/2015  02/12/2015, 4:14 PM

## 2015-02-12 NOTE — Progress Notes (Signed)
Physical Therapy Note  Patient Details  Name: [name] MRN: [id number] Date of Birth: September 18, 1975 Today's Date: 02/12/2015    Patient missed 45 min skilled therapy, declining in PM due to going to HD this evening. Will f/u per POC.    Carney Living A 02/12/2015, 4:18 PM

## 2015-02-12 NOTE — Progress Notes (Signed)
Subjective/Complaints: Nausea and diarrhea yesterday.  Now off IV abx ROS:  No sweats chills, , no other jt pains  Celexa started 11/12 Objective: Vital Signs: Blood pressure 133/66, pulse 83, temperature 98.2 F (36.8 C), temperature source Oral, resp. rate 17, weight 86.4 kg (190 lb 7.6 oz), last menstrual period 01/21/2015, SpO2 100 %. No results found. Results for orders placed or performed during the hospital encounter of 02/04/15 (from the past 72 hour(s))  Renal function panel     Status: Abnormal   Collection Time: 02/09/15  6:16 PM  Result Value Ref Range   Sodium 139 135 - 145 mmol/L   Potassium 3.9 3.5 - 5.1 mmol/L   Chloride 102 101 - 111 mmol/L   CO2 30 22 - 32 mmol/L   Glucose, Bld 64 (L) 65 - 99 mg/dL   BUN 8 6 - 20 mg/dL   Creatinine, Ser 5.45 (H) 0.44 - 1.00 mg/dL   Calcium 9.0 8.9 - 10.3 mg/dL   Phosphorus 3.0 2.5 - 4.6 mg/dL   Albumin 1.8 (L) 3.5 - 5.0 g/dL   GFR calc non Af Amer 9 (L) >60 mL/min   GFR calc Af Amer 10 (L) >60 mL/min    Comment: (NOTE) The eGFR has been calculated using the CKD EPI equation. This calculation has not been validated in all clinical situations. eGFR's persistently <60 mL/min signify possible Chronic Kidney Disease.    Anion gap 7 5 - 15  CBC     Status: Abnormal   Collection Time: 02/09/15  6:16 PM  Result Value Ref Range   WBC 7.5 4.0 - 10.5 K/uL   RBC 2.87 (L) 3.87 - 5.11 MIL/uL   Hemoglobin 8.3 (L) 12.0 - 15.0 g/dL   HCT 27.7 (L) 36.0 - 46.0 %   MCV 96.5 78.0 - 100.0 fL   MCH 28.9 26.0 - 34.0 pg   MCHC 30.0 30.0 - 36.0 g/dL   RDW 18.7 (H) 11.5 - 15.5 %   Platelets 149 (L) 150 - 400 K/uL  Occult blood card to lab, stool RN will collect     Status: None   Collection Time: 02/12/15  1:20 AM  Result Value Ref Range   Fecal Occult Bld NEGATIVE NEGATIVE     HEENT: normal Cardio: RRR and no murmur Resp: CTA B/L and unlabored GI: BS positive and NT, ND Extremity:  Pulses positive and No Edema Skin:   Wound Left  thigh swollen aroung graft, no drainage from access site, mildly tender Neuro: Alert/Oriented, Normal Sensory and Abnormal Motor 4/5 Left delt, bi, tri, grip, 3- L hip flex, knee xt, ankle DF/PF Musc/Skel:  Swelling Left knee, no jt line tenderness, has L thigh swelling as well Gen NAD   Assessment/Plan: 1. Functional deficits secondary to right basal ganglia infarct with increased LLE weakness and debility from recent bacteremia which require 3+ hours per day of interdisciplinary therapy in a comprehensive inpatient rehab setting. Physiatrist is providing close team supervision and 24 hour management of active medical problems listed below. Physiatrist and rehab team continue to assess barriers to discharge/monitor patient progress toward functional and medical goals. FIM: Function - Bathing Bathing activity did not occur: Refused Position: Sitting EOB Body parts bathed by patient: Right arm, Left arm, Chest, Abdomen, Right upper leg, Left upper leg Body parts bathed by helper: Back, Front perineal area, Buttocks Bathing not applicable: Left lower leg, Right lower leg Assist Level:  (patient completed 6/8, 75%, FIM level 4, min assist)  Function-  Upper Body Dressing/Undressing What is the patient wearing?: Pull over shirt/dress Pull over shirt/dress - Perfomed by patient: Thread/unthread right sleeve, Thread/unthread left sleeve, Put head through opening, Pull shirt over trunk Assist Level: Set up Set up : To obtain clothing/put away Function - Lower Body Dressing/Undressing What is the patient wearing?: Pants, Non-skid slipper socks, Ted Hose Position: Sitting EOB Underwear - Performed by helper: Thread/unthread right underwear leg, Thread/unthread left underwear leg, Pull underwear up/down Pants- Performed by patient: Thread/unthread right pants leg, Thread/unthread left pants leg, Pull pants up/down Non-skid slipper socks- Performed by helper: Don/doff right sock, Don/doff left  sock TED Hose - Performed by helper: Don/doff left TED hose Assist for footwear: Setup Assist for lower body dressing: Touching or steadying assistance (Pt > 75%)  Function - Toileting Toileting activity did not occur: Safety/medical concerns Toileting steps completed by patient: Adjust clothing prior to toileting, Performs perineal hygiene, Adjust clothing after toileting Toileting steps completed by helper: Adjust clothing prior to toileting, Performs perineal hygiene, Adjust clothing after toileting Assist level: Two helpers (At [facility])  Function - Air cabin crew transfer activity did not occur: N/A Toilet transfer assistive device: Bedside commode Assist level to bedside commode (at bedside): Moderate assist (Pt 50 - 74%/lift or lower) Assist level from bedside commode (at bedside): Moderate assist (Pt 50 - 74%/lift or lower)  Function - Chair/bed transfer Chair/bed transfer method: Stand pivot Chair/bed transfer assist level: Touching or steadying assistance (Pt > 75%) Chair/bed transfer assistive device: Bedrails, Armrests Chair/bed transfer details: Verbal cues for precautions/safety, Verbal cues for safe use of DME/AE  Function - Locomotion: Wheelchair Will patient use wheelchair at discharge?: Yes Type: Manual Max wheelchair distance: 50 Assist Level: Supervision or verbal cues Wheel 50 feet with 2 turns activity did not occur: Safety/medical concerns Wheel 150 feet activity did not occur: Safety/medical concerns Turns around,maneuvers to table,bed, and toilet,negotiates 3% grade,maneuvers on rugs and over doorsills: No Function - Locomotion: Ambulation Assistive device: Walker-rolling Max distance: 15' (, 5'x2) Assist level: Touching or steadying assistance (Pt > 75%) Assist level: Touching or steadying assistance (Pt > 75%) Walk 50 feet with 2 turns activity did not occur: Safety/medical concerns Walk 150 feet activity did not occur: Safety/medical concerns Walk  10 feet on uneven surfaces activity did not occur: Safety/medical concerns  Function - Comprehension Comprehension: Auditory Comprehension assist level: Follows basic conversation/direction with extra time/assistive device  Function - Expression Expression: Verbal Expression assist level: Expresses complex 90% of the time/cues < 10% of the time  Function - Social Interaction Social Interaction assist level: Interacts appropriately with others with medication or extra time (anti-anxiety, antidepressant).  Function - Problem Solving Problem solving assist level: Solves basic 50 - 74% of the time/requires cueing 25 - 49% of the time  Function - Memory Memory assist level: Recognizes or recalls less than 25% of the time/requires cueing greater than 75% of the time Patient normally able to recall (first 3 days only): Current season, Staff names and faces, That he or she is in a hospital, Location of own room  Medical Problem List and Plan: 1. Functional deficits secondary to right basal ganglia infarct and debility from recent bacteremia.  Increased weakness noted in LLE ? Related to decondtioning vs new infarct 2.  DVT Prophylaxis/Anticoagulation: Chronic Coumadin on hold until antibiotics completed 02/09/2015, D/C summ states coumadin to be restarted 2 wks after treatment with abx, will clarify with Cardiologygiven dropping Hgb  Negativestool guaic 3. Pain Management: Oxycodone and Flexeril as needed.  4. ID/Pseudomonas sepsis. Completed Tressie Ellis 02/09/2015, monitor temp, WBC will go up with steroid burst 5. Neuropsych: This patient is capable of making decisions on her own behalf. 6. Skin/Wound Care: Routine skin checks 7. Fluids/Electrolytes/Nutrition: Routine I/Os with follow-up chemistries 8. End-stage renal disease/lupus nephritis. Continue hemodialysis as per renal services, uses new Left femoral graft for HD, Nephro to address pt's HD/TRansplant questions        9. Orthostatic  hypotension.Midodrine 10 mg twice a day 10. Chronic anemia. Continue Aranesp 11. Hyperlipidemia. Lipitor 12.  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  Knee pain , left improved ( may be related to prednisone) 15.  Diarrhea post abx- 3 stool in 24h check Cdiff LOS (Days) 8 A FACE TO FACE EVALUATION WAS PERFORMED  Ekin Pilar E 02/12/2015, 7:39 AM

## 2015-02-13 ENCOUNTER — Inpatient Hospital Stay (HOSPITAL_COMMUNITY): Payer: Medicaid Other

## 2015-02-13 ENCOUNTER — Inpatient Hospital Stay (HOSPITAL_COMMUNITY): Payer: Medicaid Other | Admitting: Occupational Therapy

## 2015-02-13 DIAGNOSIS — D6189 Other specified aplastic anemias and other bone marrow failure syndromes: Secondary | ICD-10-CM

## 2015-02-13 LAB — PROTIME-INR
INR: 1.27 (ref 0.00–1.49)
Prothrombin Time: 16 seconds — ABNORMAL HIGH (ref 11.6–15.2)

## 2015-02-13 LAB — GLUCOSE, CAPILLARY
GLUCOSE-CAPILLARY: 61 mg/dL — AB (ref 65–99)
GLUCOSE-CAPILLARY: 87 mg/dL (ref 65–99)

## 2015-02-13 MED ORDER — WARFARIN - PHARMACIST DOSING INPATIENT
Freq: Every day | Status: DC
  Administered 2015-02-16: 1
  Administered 2015-02-17: 18:00:00

## 2015-02-13 MED ORDER — MIDODRINE HCL 5 MG PO TABS
10.0000 mg | ORAL_TABLET | ORAL | Status: DC
  Administered 2015-02-13 – 2015-02-18 (×8): 10 mg via ORAL
  Filled 2015-02-13 (×9): qty 2

## 2015-02-13 MED ORDER — WARFARIN SODIUM 5 MG PO TABS
5.0000 mg | ORAL_TABLET | Freq: Once | ORAL | Status: AC
  Administered 2015-02-13: 5 mg via ORAL
  Filled 2015-02-13: qty 1

## 2015-02-13 MED ORDER — SEVELAMER CARBONATE 0.8 G PO PACK
1.6000 g | PACK | Freq: Three times a day (TID) | ORAL | Status: DC
  Administered 2015-02-14 – 2015-02-18 (×10): 1.6 g via ORAL
  Administered 2015-02-18: 0.8 g via ORAL
  Filled 2015-02-13 (×20): qty 2

## 2015-02-13 MED ORDER — MIDODRINE HCL 5 MG PO TABS
10.0000 mg | ORAL_TABLET | ORAL | Status: DC
  Administered 2015-02-14 – 2015-02-19 (×4): 10 mg via ORAL
  Filled 2015-02-13 (×6): qty 2

## 2015-02-13 NOTE — Progress Notes (Addendum)
Subjective/Complaints: No abd pain or nausea- "best day ever" yesterday ROS:  No sweats chills, , no other jt pains  Celexa started 11/12 Objective: Vital Signs: Blood pressure 128/64, pulse 84, temperature 98.1 F (36.7 C), temperature source Oral, resp. rate 16, weight 88.1 kg (194 lb 3.6 oz), last menstrual period 01/21/2015, SpO2 100 %. No results found. Results for orders placed or performed during the hospital encounter of 02/04/15 (from the past 72 hour(s))  Occult blood card to lab, stool RN will collect     Status: None   Collection Time: 02/12/15  1:20 AM  Result Value Ref Range   Fecal Occult Bld NEGATIVE NEGATIVE  Occult blood card to lab, stool RN will collect     Status: None   Collection Time: 02/12/15  7:10 AM  Result Value Ref Range   Fecal Occult Bld NEGATIVE NEGATIVE  C difficile quick scan w PCR reflex     Status: None   Collection Time: 02/12/15  4:35 PM  Result Value Ref Range   C Diff antigen NEGATIVE NEGATIVE   C Diff toxin NEGATIVE NEGATIVE   C Diff interpretation Negative for toxigenic C. difficile   Occult blood card to lab, stool RN will collect     Status: None   Collection Time: 02/12/15  4:40 PM  Result Value Ref Range   Fecal Occult Bld NEGATIVE NEGATIVE  Renal function panel     Status: Abnormal   Collection Time: 02/12/15 11:29 PM  Result Value Ref Range   Sodium 136 135 - 145 mmol/L   Potassium 3.9 3.5 - 5.1 mmol/L   Chloride 99 (L) 101 - 111 mmol/L   CO2 29 22 - 32 mmol/L   Glucose, Bld 75 65 - 99 mg/dL   BUN 17 6 - 20 mg/dL   Creatinine, Ser 6.68 (H) 0.44 - 1.00 mg/dL   Calcium 8.6 (L) 8.9 - 10.3 mg/dL   Phosphorus 4.0 2.5 - 4.6 mg/dL   Albumin 2.1 (L) 3.5 - 5.0 g/dL   GFR calc non Af Amer 7 (L) >60 mL/min   GFR calc Af Amer 8 (L) >60 mL/min    Comment: (NOTE) The eGFR has been calculated using the CKD EPI equation. This calculation has not been validated in all clinical situations. eGFR's persistently <60 mL/min signify possible  Chronic Kidney Disease.    Anion gap 8 5 - 15  CBC     Status: Abnormal   Collection Time: 02/12/15 11:29 PM  Result Value Ref Range   WBC 5.6 4.0 - 10.5 K/uL   RBC 2.80 (L) 3.87 - 5.11 MIL/uL   Hemoglobin 8.2 (L) 12.0 - 15.0 g/dL   HCT 26.6 (L) 36.0 - 46.0 %   MCV 95.0 78.0 - 100.0 fL   MCH 29.3 26.0 - 34.0 pg   MCHC 30.8 30.0 - 36.0 g/dL   RDW 17.9 (H) 11.5 - 15.5 %   Platelets 193 150 - 400 K/uL  Glucose, capillary     Status: Abnormal   Collection Time: 02/13/15  4:04 AM  Result Value Ref Range   Glucose-Capillary 61 (L) 65 - 99 mg/dL  Glucose, capillary     Status: None   Collection Time: 02/13/15  5:22 AM  Result Value Ref Range   Glucose-Capillary 87 65 - 99 mg/dL     HEENT: normal Cardio: RRR and no murmur Resp: CTA B/L and unlabored GI: BS positive and NT, ND Extremity:  Pulses positive and No Edema Skin:  Wound Left thigh swollen aroung graft, no drainage from access site, mildly tender Neuro: Alert/Oriented, Normal Sensory and Abnormal Motor 4/5 Left delt, bi, tri, grip, 3- L hip flex, knee xt, ankle DF/PF Musc/Skel:  Swelling Left knee, no jt line tenderness, has L thigh swelling as well Gen NAD   Assessment/Plan: 1. Functional deficits secondary to right basal ganglia infarct with increased LLE weakness and debility from recent bacteremia which require 3+ hours per day of interdisciplinary therapy in a comprehensive inpatient rehab setting. Physiatrist is providing close team supervision and 24 hour management of active medical problems listed below. Physiatrist and rehab team continue to assess barriers to discharge/monitor patient progress toward functional and medical goals. Team conference today please see physician documentation under team conference tab, met with team face-to-face to discuss problems,progress, and goals. Formulized individual treatment plan based on medical history, underlying problem and comorbidities. FIM: Function - Bathing Bathing  activity did not occur: Refused Position: Sitting EOB Body parts bathed by patient: Right arm, Left arm, Chest, Abdomen, Right upper leg, Left upper leg Body parts bathed by helper: Back, Front perineal area, Buttocks Bathing not applicable: Left lower leg, Right lower leg Assist Level:  (patient completed 6/8, 75%, FIM level 4, min assist)  Function- Upper Body Dressing/Undressing What is the patient wearing?: Hospital gown Pull over shirt/dress - Perfomed by patient: Thread/unthread right sleeve, Thread/unthread left sleeve, Put head through opening, Pull shirt over trunk Assist Level: Set up Set up : To obtain clothing/put away Function - Lower Body Dressing/Undressing What is the patient wearing?: Hospital Gown, Non-skid slipper socks Position: Sitting EOB Underwear - Performed by helper: Thread/unthread right underwear leg, Thread/unthread left underwear leg, Pull underwear up/down Pants- Performed by patient: Thread/unthread right pants leg, Thread/unthread left pants leg, Pull pants up/down Non-skid slipper socks- Performed by helper: Don/doff right sock, Don/doff left sock TED Hose - Performed by helper: Don/doff left TED hose Assist for footwear: Setup Assist for lower body dressing: Touching or steadying assistance (Pt > 75%)  Function - Toileting Toileting activity did not occur: Safety/medical concerns Toileting steps completed by patient: Adjust clothing prior to toileting, Performs perineal hygiene, Adjust clothing after toileting Toileting steps completed by helper: Adjust clothing prior to toileting, Performs perineal hygiene, Adjust clothing after toileting Assist level: Two helpers (At [facility])  Function - Air cabin crew transfer activity did not occur: N/A Toilet transfer assistive device: Bedside commode Assist level to bedside commode (at bedside): Moderate assist (Pt 50 - 74%/lift or lower) Assist level from bedside commode (at bedside): Moderate assist (Pt  50 - 74%/lift or lower)  Function - Chair/bed transfer Chair/bed transfer method: Stand pivot Chair/bed transfer assist level: Supervision or verbal cues Chair/bed transfer assistive device: Armrests, Walker Chair/bed transfer details: Verbal cues for safe use of DME/AE, Verbal cues for technique  Function - Locomotion: Wheelchair Will patient use wheelchair at discharge?: Yes Type: Manual Max wheelchair distance: 75 Assist Level: Supervision or verbal cues Wheel 50 feet with 2 turns activity did not occur: Safety/medical concerns Assist Level: Supervision or verbal cues Wheel 150 feet activity did not occur: Safety/medical concerns Assist Level: Touching or steadying assistance (Pt > 75%) Turns around,maneuvers to table,bed, and toilet,negotiates 3% grade,maneuvers on rugs and over doorsills: No Function - Locomotion: Ambulation Assistive device: Walker-rolling Max distance: 120' Assist level: Touching or steadying assistance (Pt > 75%) Assist level: Touching or steadying assistance (Pt > 75%) Walk 50 feet with 2 turns activity did not occur: Safety/medical concerns Assist level: Touching  or steadying assistance (Pt > 75%) Walk 150 feet activity did not occur: Safety/medical concerns Walk 10 feet on uneven surfaces activity did not occur: Safety/medical concerns  Function - Comprehension Comprehension: Auditory Comprehension assist level: Follows basic conversation/direction with no assist  Function - Expression Expression: Verbal Expression assist level: Expresses basic needs/ideas: With no assist  Function - Social Interaction Social Interaction assist level: Interacts appropriately 90% of the time - Needs monitoring or encouragement for participation or interaction.  Function - Problem Solving Problem solving assist level: Solves basic problems with no assist  Function - Memory Memory assist level: Recognizes or recalls 90% of the time/requires cueing < 10% of the  time Patient normally able to recall (first 3 days only): Current season, Staff names and faces, That he or she is in a hospital, Location of own room  Medical Problem List and Plan: 1. Functional deficits secondary to right basal ganglia infarct and debility from recent bacteremia.  Increased weakness noted in LLE ? Related to decondtioning vs new infarct 2.  DVT Prophylaxis/Anticoagulation: Chronic Coumadin on hold until antibiotics completed 02/09/2015, D/C summ states coumadin to be restarted 2 wks after treatment with abx, will clarify with Cardiology given dropping Hgb  Negative stool guaic, Cardiology is conferring with hematology (? Hx of lupus anticoagulant)  I discussed this complex case with Dr. Sharlyne Cai from cardiology as well as Dr. Jim Desanctis from hematology. Consensus is to do anticoagulation.Hematology recommends no loading dose for warfarin. Start at 5 mg and titrate to INR between 2 and 3 Because of end-stage renal disease cannot do Factor X a inhibitors 3. Pain Management: Oxycodone and Flexeril as needed.  4. ID/Pseudomonas sepsis. Completed Tressie Ellis 02/09/2015, monitor temp, WBC will go up with steroid burst 5. Neuropsych: This patient is capable of making decisions on her own behalf. 6. Skin/Wound Care: Routine skin checks 7. Fluids/Electrolytes/Nutrition: Routine I/Os with follow-up chemistries 8. End-stage renal disease/lupus nephritis. Continue hemodialysis as per renal services, uses new Left femoral graft for HD, Nephro to address pt's HD/TRansplant questions        9. Orthostatic hypotension.Midodrine 10 mg twice a day 10. Chronic anemia. Continue Aranesp 11. Hyperlipidemia. Lipitor 12.  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  Knee pain , left improved ( may be  related to prednisone) 15.  Diarrhea post abx- 3 stool in 24h  Cdiff - x 1 neg LOS (Days) 9 A FACE TO FACE EVALUATION WAS PERFORMED  Briah Nary E 02/13/2015, 7:08 AM

## 2015-02-13 NOTE — Consult Note (Signed)
CARDIOLOGY CONSULT NOTE  Patient ID: Lakeysha Slutsky MRN: [id number] DOB/AGE: [birth year] 39 y.o.  Admit date: 02/04/2015 Referring Physician: Physical Medicine and Rehab Primary Physician:  Kristine Garbe, MD Reason for Consultation: Manage anticoagulation  HPI: [name]  is a 39 y.o. female with a history of systemic lupus, history of PSVT s/p AVNRT ablation on 07/21/2012 by Dr. Crissie Sickles, hypertension, hyperlipidemia, and chronic anemia who was last seen in our office on 11/19/2014. At that time, she had reported increased palpitations, dyspnea on exertion, and chest discomfort. She wore event monitor which did not reveal any recurrence of SVT, had echocardiogram which was essentially normal, and had been scheduled for stress test which she was unable to have due to hospitalization.   She presented to the hospital on 12/03/2014 with dysuria, fever, and nausea and was admitted for pyelonephritis and started on abx. 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 On 12/21/2014, she developed AMS with left facial droop and MRI confirmed right MCA infarct. TEE on 12/25/2014 revealed mitral valve vegetation vs endocarditis and she was again placed on abx and started on coumadin.  MRI was repeated on 10/28 d/t worsening deficits and revealed expansion of the anterior right MCA infarct as well as acute ischemic changes in the basal ganglia. Coumadin was stopped on 10/30 due to risk of hemorrhagic transformation of cerebral emboli.  Repeat TTE on 10/28 and TEE on 11/2 did not show evidence of vegetation. Pt has also developed thrombocytopenia during course of admission with persistent anemia which has been chronic.  We were consulted to give opinion regarding anticoagulation.   Past Medical History  Diagnosis Date  . Hypertension   . Cardiac arrhythmia   . SVT (supraventricular tachycardia) ([place])     "today, last week, 2 wk ago; maybe 1 month ago, etc; started w/in last 3-4  yrs"(07/20/2012)  . Fainting     "~ 1 month ago; probably related to SVT" (07/20/2012)  . Shortness of breath     "related to SVT episodes" (07/20/2012)  . Chest pain at rest     "related to SVT" (07/20/2012)  . Lupus (systemic lupus erythematosus) ([place])   . GERD (gastroesophageal reflux disease)   . Fibromyalgia   . Irritable bowel syndrome (IBS)   . Hypercholesterolemia   . Heart murmur     "small" (12/25/2014)  . TIA (transient ischemic attack) 12/21/2014  . Sleep apnea     "don't wear mask anymore" (12/25/2014)  . Anemia   . History of blood transfusion "several"    "related to low counts"  . Daily headache   . Arthritis     "hips" (12/25/2014)  . Anxiety     "sometimes; don't take anything for it" (12/25/2014)  . ESRD (end stage renal disease) on dialysis ([place]) since 12/07/2014    Diagnosed Aug 2014 w SLE nephritis DPGN by biopsy, rx cellcept/ steroids.  Repeat biopsy early 2016 membranous w/o activity so meds weaned off. Big flare Aug '16 creat 6, 3rd biopsy DPGN, meds resumed. Ended up starting HD Sept 2016  . Stroke ([place])   . History of vascular access device     2016: Sept 9  R IJ cath per IR.  Sept 20 not candidate for fistula due to disease veins, had LUA hybrid graft placed. Sept 21 - steal syndrome, LUA AVG ligated. Oct 7 - new left femoral Gore-tex loop graft per VVS. Oct 29 - removal R IJ tunneled HD cath  Past Surgical History  Procedure Laterality Date  . Cesarean section  2001; 2010  . Cervical biopsy  2013  . Supraventricular tachycardia ablation  07/21/12     Dr. Cristopher Peru   . Peripheral vascular catheterization N/A 12/14/2014    Procedure: Upper Extremity Venography;  Surgeon: Elam Dutch, MD;  Location: [place];  Service: Cardiovascular;  Laterality: N/A;  . Insertion hybrid anteriovenous gortex graft Left 12/18/2014    Procedure: INSERTION GORE HYBRID ARTERIOVENOUS GRAFT LEFT AXILLO-BRACHIAL.;  Surgeon: Serafina Mitchell, MD;  Location: [facility];   Service: Vascular;  Laterality: Left;  . Ligation of arteriovenous  fistula Left 12/19/2014    Procedure: LIGATION OF FISTULA;  Surgeon: Elam Dutch, MD;  Location: [place];  Service: Vascular;  Laterality: Left;  . Appendectomy  2011  . Tubal ligation  2010  . Tee without cardioversion N/A 12/25/2014    Procedure: TRANSESOPHAGEAL ECHOCARDIOGRAM (TEE);  Surgeon: Sueanne Margarita, MD;  Location: [facility];  Service: Cardiovascular;  Laterality: N/A;  . Av fistula placement Left 01/04/2015    Procedure: INSERTION OF ARTERIOVENOUS (AV) GORE-TEX GRAFT THIGH;  Surgeon: Rosetta Posner, MD;  Location: [place];  Service: Vascular;  Laterality: Left;  . Tee without cardioversion N/A 01/30/2015    Procedure: TRANSESOPHAGEAL ECHOCARDIOGRAM (TEE);  Surgeon: Lelon Perla, MD;  Location: [facility]OSCOPY;  Service: Cardiovascular;  Laterality: N/A;     Family History  Problem Relation Age of Onset  . Cancer Mother     breast and ovarian  . BRCA 1/2 Sister      Social History: Social History   Social History  . Marital Status: Single    Spouse Name: N/A  . Number of Children: N/A  . Years of Education: N/A   Occupational History  . Not on file.   Social History Main Topics  . Smoking status: Never Smoker   . Smokeless tobacco: Never Used  . Alcohol Use: No     Comment: 07/20/2012 "might have a beer once/yr"  . Drug Use: No  . Sexual Activity: Not Currently    Birth Control/ Protection: None   Other Topics Concern  . Not on file   Social History Narrative     Prescriptions prior to admission  Medication Sig Dispense Refill Last Dose  . atorvastatin (LIPITOR) 20 MG tablet Take 1 tablet (20 mg total) by mouth daily at 6 PM. 30 tablet 2 01/24/2015 at Unknown time  . Calcium Carbonate Antacid (TUMS PO) Take 1-2 tablets by mouth daily as needed (heartburn).   unk  . cefTAZidime 2 g in dextrose 5 % 50 mL Inject 2 g into the vein Every Tuesday,Thursday,and Saturday with dialysis.     Marland Kitchen  cyclobenzaprine (FLEXERIL) 10 MG tablet Take 1 tablet (10 mg total) by mouth at bedtime as needed for muscle spasms. 30 tablet 0 01/23/2015  . Darbepoetin Alfa (ARANESP) 200 MCG/0.4ML SOSY injection Inject 0.4 mLs (200 mcg total) into the vein every Thursday with hemodialysis. 1.68 mL    . hydroxychloroquine (PLAQUENIL) 200 MG tablet Take 2 tablets (400 mg total) by mouth daily. 60 tablet 1 01/24/2015 at Unknown time  . metoprolol succinate (TOPROL-XL) 25 MG 24 hr tablet Take 1 tablet (25 mg total) by mouth at bedtime. 30 tablet 1 01/24/2015 at 2200  . midodrine (PROAMATINE) 10 MG tablet Take 1 tablet (10 mg total) by mouth 2 (two) times daily with a meal. 60 tablet 0   . multivitamin (RENA-VIT) TABS  tablet Take 1 tablet by mouth at bedtime. 30 tablet 0 01/24/2015 at Unknown time  . oxyCODONE-acetaminophen (PERCOCET) 5-325 MG tablet Take 1 tablet by mouth every 6 (six) hours as needed for severe pain. 90 tablet 0 01/17/2015  . pantoprazole (PROTONIX) 20 MG tablet Take 1 tablet (20 mg total) by mouth daily. 30 tablet 1 01/24/2015 at Unknown time  . polyethylene glycol (MIRALAX / GLYCOLAX) packet Take 17 g by mouth daily as needed for mild constipation. 14 each 0   . trimethobenzamide (TIGAN) 300 MG capsule Take 1 capsule (300 mg total) by mouth every 8 (eight) hours as needed for nausea/vomiting. 20 capsule 0     ROS: General: no fevers/chills/night sweats Eyes: no blurry vision, diplopia, or amaurosis ENT: no sore throat or hearing loss Resp: no cough, wheezing, or hemoptysis CV: no edema or palpitations GI: no abdominal pain, nausea, vomiting, diarrhea, or constipation GU: no dysuria, frequency, or hematuria Skin: no rash Neuro: no headache, numbness, tingling, or weakness of extremities Musculoskeletal: no joint pain or swelling Heme: no bleeding, DVT, or easy bruising Endo: no polydipsia or polyuria    Physical Exam: Blood pressure 128/64, pulse 84, temperature 98.1 F (36.7 C),  temperature source Oral, resp. rate 16, weight 88.1 kg (194 lb 3.6 oz), last menstrual period 01/21/2015, SpO2 100 %.   General appearance: alert, cooperative, fatigued and no distress Neck: no adenopathy, no carotid bruit, no JVD, supple, symmetrical, trachea midline and thyroid not enlarged, symmetric, no tenderness/mass/nodules Lungs: clear to auscultation bilaterally Chest wall: no tenderness Heart: regular rate and rhythm, S1, S2 normal, no murmur, click, rub or gallop Extremities: extremities normal, atraumatic, no cyanosis or edema Pulses: 2+ and symmetric  Labs:   Lab Results  Component Value Date   WBC 5.6 02/12/2015   HGB 8.2* 02/12/2015   HCT 26.6* 02/12/2015   MCV 95.0 02/12/2015   PLT 193 02/12/2015    Recent Labs Lab 02/12/15 2329  NA 136  K 3.9  CL 99*  CO2 29  BUN 17  CREATININE 6.68*  CALCIUM 8.6*  GLUCOSE 75    Lipid Panel     Component Value Date/Time   CHOL 113 12/21/2014 1638   TRIG 107 12/21/2014 1638   HDL 53 12/21/2014 1638   CHOLHDL 2.1 12/21/2014 1638   VLDL 21 12/21/2014 1638   LDLCALC 39 12/21/2014 1638    BNP (last 3 results)  Recent Labs  06/29/14 0048  BNP 191.9*    ProBNP (last 3 results) No results for input(s): PROBNP in the last 8760 hours.  HEMOGLOBIN A1C Lab Results  Component Value Date   HGBA1C 6.1* 12/22/2014   MPG 128 12/22/2014    Cardiac Panel (last 3 results)  Recent Labs  01/25/15 1918 01/26/15 0109 01/26/15 0723  TROPONINI 0.21* 0.18* 0.29*    Lab Results  Component Value Date   CKTOTAL 1260* 08/24/2010   CKMB * 08/24/2010    20.7 CRITICAL VALUE NOTED.  VALUE IS CONSISTENT WITH PREVIOUSLY REPORTED AND CALLED VALUE.   TROPONINI 0.29* 01/26/2015     TSH  Recent Labs  01/31/15 1000  TSH 8.798*    TEE 01/30/2015: Normal LV function; no vegetations; trace AI and MR; mild TR.  TEE 12/25/2014: Normal LV function; trivial AI, mild to moderate LAE, mild RAE  Mitral valve: Calcified MV  annulus. There is shaggy mobile density that emenates off the lateral aspect of the posterior mitral valve leaflet near the annulus measuring 0.7 x 0.5 cm in diameter.  This is consistent with vegetation. With patient'shistory of SLE this may represent marantic endocarditis.    Radiology: No results found.  Scheduled Meds: . atorvastatin  20 mg Oral q1800  . citalopram  10 mg Oral Daily  . darbepoetin (ARANESP) injection - DIALYSIS  200 mcg Intravenous Q Thu-HD  . feeding supplement (PRO-STAT SUGAR FREE 64)  30 mL Oral BID  . heparin  2,600 Units Dialysis Once in dialysis  . hydroxychloroquine  400 mg Oral Daily  . metoprolol succinate  25 mg Oral QHS  . midodrine  10 mg Oral 2 times per day on Tue Thu Sat  . midodrine  10 mg Oral 2 times per day on Sun Mon Wed Fri  . multivitamin  1 tablet Oral QHS  . pantoprazole  40 mg Oral Daily  . predniSONE  20 mg Oral Q breakfast  . sevelamer carbonate  1,600 mg Oral TID WC   Continuous Infusions:  PRN Meds:.sodium chloride, sodium chloride, acetaminophen **OR** acetaminophen, alteplase, calcium carbonate, cyclobenzaprine, heparin, heparin, lidocaine (PF), lidocaine-prilocaine, loperamide, ondansetron **OR** ondansetron (ZOFRAN) IV, oxyCODONE-acetaminophen, pentafluoroprop-tetrafluoroeth, polyethylene glycol, sorbitol  ASSESSMENT AND PLAN:  1. Marantic endocarditis-resolved 2. H/O CVA 3. SLE with lupus nephritis 4. ESRD on HD 5. Chronic anemia 6. Thrombocytopenia 7. Hyperlipidemia 8. Benign essential hypertension  Rec: I have reviewed the charts and also discussed with Dr. Murriel Hopper regarding this complex hospitalization and clinical course. Due to recurrent thrombotic events including stroke, AV shunt loss, lupus, I would recommend anticoagulation long-term. Best option is heparin or lovenox, but practically speaking, it will be hard to administer regular heparin twice daily lifelong large volume. Hence recommend Warfarin per  pharmacy. I will be happy to f/u as OP, but she otherwise does not have any significant cardiac issues and she can f/u with her PCP or Nephrology for management of Coumadin with INR goal of 2-3. Thank you.   Adrian Prows, MD 02/14/2015, 6:54 AM [facility]. [place] Pager: [phone] Office: [phone] If no answer: Cell:  [phone]

## 2015-02-13 NOTE — Patient Care Conference (Signed)
Inpatient RehabilitationTeam Conference and Plan of Care Update Date: 02/13/2015   Time: 10:50 AM    Patient Name: [name]      Medical Record Number: [id number]  Date of Birth: 1975-12-11 Sex: Female         Room/Bed: 4M05C/4M05C-01 Payor Info: Payor: MEDICAID [place] / Plan: MEDICAID [place] ACCESS / Product Type: *No Product type* /    Admitting Diagnosis: Debility  Admit Date/Time:  02/04/2015  5:50 PM Admission Comments: No comment available   Primary Diagnosis:  <principal problem not specified> Principal Problem: <principal problem not specified>  Patient Active Problem List   Diagnosis Date Noted  . Major depressive disorder, single episode, mild ([place])   . Left lateral knee pain 02/07/2015  . History of vascular access device   . End-stage renal disease on hemodialysis ([place])   . Pseudomonas sepsis ([place])   . Fever, unspecified 01/25/2015  . Sepsis ([place]) 01/25/2015  . Adjustment disorder with depressed mood   . Post-operative pain 01/10/2015  . Left hemiparesis ([place])   . Right middle cerebral artery stroke ([place]) 01/02/2015  . Anemia due to bone marrow failure ([place])   . 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 Other pancytopenia ([place])   . Pressure ulcer 12/21/2014  . Steal syndrome as complication of dialysis access ([place])   . ESRD (end stage renal disease) on dialysis ([place])   . UTI (lower urinary tract infection)   . Retroperitoneal hematoma   . Lupus nephritis ([place])   . Hypertension 07/20/2012  . Weight loss, unintentional 07/20/2012    Expected Discharge Date: Expected Discharge Date: 02/19/15  Team Members Present: Physician leading conference: Dr. Alysia Penna Social Worker Present: Ovidio Kin, LCSW Nurse Present: Dorien Chihuahua, RN PT Present: Georjean Mode, PT OT Present: Clyda Greener, OT SLP Present: Windell Moulding, SLP PPS Coordinator present : Daiva Nakayama, RN, CRRN     Current Status/Progress Goal Weekly Team Focus  Medical   pleuritis due  to SLE, now on prednisone, L knee pain improved, mood improved celexa  home with family assist  restart anticoag, take off prednisone   Bowel/Bladder   Pt continent of bowel- loose stools. 11-15 LBM. oliguric HD  Pt to remain continent of bowel and manage bowels min assist  encourgae use of toliet HS. monitor stools   Swallow/Nutrition/ Hydration     na        ADL's   set up UB self care, mod A LB self care (pt plans to buy a hip kit), mod A squat pivot or 4WW transfers, decreased endurance  supervision for transfers and selfcare tasks, modified for grooming and UB selfcare   ADL retraining, activity tolerance, strengthening, functional mobility, standing balance, pt/family education with AE    Mobility   min>mod assistance for bed mobility, supervision basic transfers, supervision gait x 40' with 4WW, min assist up/down 5 steps (3") bil rails  supervision basic transfers, min car transfers, supervision gait controllled and community x 50' , home environment x 25'; supervision w/c x 75'  family ed, strengthening, activity tolerance, gait, balance retraining, pt ed   Communication     na        Safety/Cognition/ Behavioral Observations    no unsafe behaviors        Pain   1 percocet q6hrs PRN. left thigh/leg pain  less than or equal 3  assess pain and medicate as needed   Skin   left though graft inplace. old healed areas to sacrum-one pink spot  noted- EPBC applied.  no additional skin breakdown while on rehab min assist  assess skin q shift      *See Care Plan and progress notes for long and short-term goals.  Barriers to Discharge: chronic deconditioning    Possible Resolutions to Barriers:  Cont rehab and current management    Discharge Planning/Teaching Needs:  Home with Mom and Dad, who can assist-Mom doesn;t want her coming home too soon.      Team Discussion:  Doing well in therapies, since taking predisone doing better-done tomorrow. Started celexa seems brighter/affect  better. DC-protonix doesn't feel it works. Endurance poor needs rest breaks. Neuro-psych following. Motor planning problems. C-diff negative. Still working toward discharge 11/22. Have Mom come in for family education  Revisions to Treatment Plan:  None   Continued Need for Acute Rehabilitation Level of Care: The patient requires daily medical management by a physician with specialized training in physical medicine and rehabilitation for the following conditions: Daily direction of a multidisciplinary physical rehabilitation program to ensure safe treatment while eliciting the highest outcome that is of practical value to the patient.: Yes Daily medical management of patient stability for increased activity during participation in an intensive rehabilitation regime.: Yes Daily analysis of laboratory values and/or radiology reports with any subsequent need for medication adjustment of medical intervention for : Neurological problems;Other;Post surgical problems  [name] 02/14/2015, 8:49 AM

## 2015-02-13 NOTE — Progress Notes (Signed)
ANTICOAGULATION CONSULT NOTE - Initial Consult  Pharmacy Consult for coumadin Indication: hx of SLE and hypercoagulable state  Allergies  Allergen Reactions  . Food Swelling    Red peppers    Patient Measurements: Weight: 194 lb 3.6 oz (88.1 kg) Heparin Dosing Weight:   Vital Signs: Temp: 98.1 F (36.7 C) (11/16 0407) Temp Source: Oral (11/16 0407) BP: 128/64 mmHg (11/16 0407) Pulse Rate: 84 (11/16 0407)  Labs:  Recent Labs  02/12/15 2329  HGB 8.2*  HCT 26.6*  PLT 193  CREATININE 6.68*    Estimated Creatinine Clearance: 11.9 mL/min (by C-G formula based on Cr of 6.68).   Medical History: Past Medical History  Diagnosis Date  . Hypertension   . Cardiac arrhythmia   . SVT (supraventricular tachycardia) ([place])     "today, last week, 2 wk ago; maybe 1 month ago, etc; started w/in last 3-4 yrs"(07/20/2012)  . Fainting     "~ 1 month ago; probably related to SVT" (07/20/2012)  . Shortness of breath     "related to SVT episodes" (07/20/2012)  . Chest pain at rest     "related to SVT" (07/20/2012)  . Lupus (systemic lupus erythematosus) ([place])   . GERD (gastroesophageal reflux disease)   . Fibromyalgia   . Irritable bowel syndrome (IBS)   . Hypercholesterolemia   . Heart murmur     "small" (12/25/2014)  . TIA (transient ischemic attack) 12/21/2014  . Sleep apnea     "don't wear mask anymore" (12/25/2014)  . Anemia   . History of blood transfusion "several"    "related to low counts"  . Daily headache   . Arthritis     "hips" (12/25/2014)  . Anxiety     "sometimes; don't take anything for it" (12/25/2014)  . ESRD (end stage renal disease) on dialysis ([place]) since 12/07/2014    Diagnosed Aug 2014 w SLE nephritis DPGN by biopsy, rx cellcept/ steroids.  Repeat biopsy early 2016 membranous w/o activity [name] meds weaned off. Big flare Aug '16 creat 6, 3rd biopsy DPGN, meds resumed. Ended up starting HD Sept 2016  . Stroke ([place])   . History of vascular access device     2016:  Sept 9  R IJ cath per IR.  Sept 20 not candidate for fistula due to disease veins, had LUA hybrid graft placed. Sept 21 - steal syndrome, LUA AVG ligated. Oct 7 - new left femoral Gore-tex loop graft per VVS. Oct 29 - removal R IJ tunneled HD cath    Medications:  Scheduled:  . atorvastatin  20 mg Oral q1800  . citalopram  10 mg Oral Daily  . darbepoetin (ARANESP) injection - DIALYSIS  200 mcg Intravenous Q Thu-HD  . feeding supplement (PRO-STAT SUGAR FREE 64)  30 mL Oral BID  . heparin  2,600 Units Dialysis Once in dialysis  . hydroxychloroquine  400 mg Oral Daily  . metoprolol succinate  25 mg Oral QHS  . [START ON 02/14/2015] midodrine  10 mg Oral 2 times per day on Tue Thu Sat  . midodrine  10 mg Oral 2 times per day on Sun Mon Wed Fri  . multivitamin  1 tablet Oral QHS  . pantoprazole  40 mg Oral Daily  . sevelamer carbonate  1,600 mg Oral TID WC   Infusions:    Assessment: 39 yo female with hx of SLE and hypercoagulable state will be started on coumadin.  Last INR was 2.21 on 01/28/15.  Today's INR is 1.27 Goal  of Therapy:  INR 2-3 Monitor platelets by anticoagulation protocol: Yes   Plan:  - coumadin 5 mg po x1 (requested by MD) - daily INR - watch for bleeding closely  [name], [name] 02/13/2015,1:07 PM

## 2015-02-13 NOTE — Progress Notes (Addendum)
Physical Therapy Session Note  Patient Details  Name: [name] MRN: [id number] Date of Birth: May 26, 1975  Today's Date: 02/13/2015 PT Individual Time: 0900-1005; [id number] PT Individual Time Calculation (min): 65 min, 60 min  Short Term Goals: Week 2:  PT Short Term Goal 1 (Week 2): = LTG due to estimated LOS  Skilled Therapeutic Interventions/Progress Updates:   tx 1:  Bed mobility training on hospital bed with L and R lateral leans due to pt's difficulty elevating chest.  She reports her bed is high; measurements pending. Practice with R lateral leans on regular bed in ADL apt. Pt demonstrated difficulty with motor planning during complex movements.  Gait with 4WW x 44', x 30' with seated rest breaks. Gait over floor mat to simulate uneven terrain.  Pt stated she was anxious; PT instructed again in paced breathing, which helped.  3" high steps forward to ascend and descend, min cues for technique.    Standing from 4WW>w/c with min guard assist.  Pt left sitting up with all needs within reach.  tx 2:  Bed mobility in flat bed without rails, to R, with extra time and eventually mod assist to raise trunk due to recent needle stick in R hand/soreness.   [place] A exs for neuro re-ed: standing 10 x 1hip abduction, heel raises, toe raises; 10 x 2 mini squats with feet offset; all in standing with bil UE support on 4WW.  Pt accepted minimal external perturbations to elicit ankle and hip strategies; pt fearful but agreeable. In standing at sink, 5 x 1 each RUE/LUE reaching up and forward with calf raises.   Gait with 4WW, x 40' x 2 with seated rest break between.  Pt returned to bed; sit>supine x 2, with mod assist 1st trial, min assist 2nd trial for LLE and max cues.   Pt left resting in bed with bed alarm set and all needs within reach. Pt would benefit from hand out for [place] for fall prevention.  Pt's LTGs downgraded for gait distances due to inconsistency of endurance,  possibly related to HD. Therapy Documentation Precautions:  Precautions Precautions: Fall Precaution Comments: Pt's femoral cath is tunneled therefore she is able to get OOB; L knee joint pain Restrictions Weight Bearing Restrictions: No   Pain: Pain Assessment Pain Assessment: No/denies pain Faces Pain Scale: Hurts a little bit (L knee and thight) Pain Intervention(s): Ambulation/increased activity      See Function Navigator for Current Functional Status.   Therapy/Group: Individual Therapy  Zenita Kister 02/13/2015, 12:30 PM

## 2015-02-13 NOTE — Progress Notes (Signed)
Occupational Therapy Session Note  Patient Details  Name: [name] MRN: [id number] Date of Birth: September 22, 1975  Today's Date: 02/13/2015 OT Individual Time: 1030-1200 OT Individual Time Calculation (min): 90 min    Short Term Goals: Week 1:  OT Short Term Goal 1 (Week 1): Pt will perform LB bathing sit to stand with min assist and AE.  OT Short Term Goal 1 - Progress (Week 1): Met OT Short Term Goal 2 (Week 1): Pt will perform LB dressing sit to stand with min assist and AE PRN. OT Short Term Goal 2 - Progress (Week 1): Met OT Short Term Goal 3 (Week 1): Pt will perform toilet transfers with min assist 3 consecutive sessions. OT Short Term Goal 3 - Progress (Week 1): Met OT Short Term Goal 4 (Week 1): Pt will perform walk-in shower transfer with min assist using the RW.  OT Short Term Goal 4 - Progress (Week 1): Discontinued (comment) (Pt has new dialysis port site and does not want to shower.) OT Short Term Goal 5 (Week 1): Pt will perform BUE light resistance exercise with supervision following written exercise plan.   OT Short Term Goal 5 - Progress (Week 1): Met  Skilled Therapeutic Interventions/Progress Updates:    Pt seen for OT session focusing on functional mobility, functional transfers, activity tolerance and UE strengthening. Pt in supine upon arrival, agreeable to tx session. Focused on bed mobility without use of hospital bed functions. Min A and max VCs for technique provided. She ambulated throughout room using rollator with supervision. She self propelled w/c to ADL apartment with increased time and rest breaks. She stood at [employer] to [employer] with emphasis on functional standing balance, and endurance. VCs provided for rollator management. Pt required max encouragement and reassurance throughout session, as she is very anxious. Education provided regarding need for confidence as well as safety awareness when completing functional tasks. She was able to  tolerate ~1 minute of static standing before requesting seated rest break. She completed simple microwave meal prep task, ambulating throughout kitchen with rollator. Educated regarding optimal kitchen set-up for success at d/c.  She practiced simulated shower stall transfer, able to complete transfer into shower with supervision following demonstration. Increased time and min A required to stand from shower chair without UE support. Pt voiced liking shower stall transfer better than tub bench, therapist in agreement as it is safety and most energy efficient transfer method. Pt declined further standing/ ambulation tasks. In therapy gym, pt completed UE strengthening exercises following demonstration and tactile cuing for proper form and technique.  Pt returned to room at end of session, left sitting in w/c set up with meal tray and all needs in reach.   Therapy Documentation Precautions:  Precautions Precautions: Fall Precaution Comments: Pt's femoral cath is tunneled therefore she is able to get OOB; L knee joint pain Restrictions Weight Bearing Restrictions: No Pain:   Voiced some discomfort at dyalysis site, however, no formal pain score given.   See Function Navigator for Current Functional Status.   Therapy/Group: Individual Therapy  Lewis, Leoda Smithhart C 02/13/2015, 7:23 AM

## 2015-02-14 ENCOUNTER — Inpatient Hospital Stay (HOSPITAL_COMMUNITY): Payer: Medicaid Other | Admitting: Physical Therapy

## 2015-02-14 ENCOUNTER — Inpatient Hospital Stay (HOSPITAL_COMMUNITY): Payer: Medicaid Other | Admitting: Occupational Therapy

## 2015-02-14 LAB — CBC
HCT: 28 % — ABNORMAL LOW (ref 36.0–46.0)
Hemoglobin: 8.1 g/dL — ABNORMAL LOW (ref 12.0–15.0)
MCH: 28.1 pg (ref 26.0–34.0)
MCHC: 28.9 g/dL — ABNORMAL LOW (ref 30.0–36.0)
MCV: 97.2 fL (ref 78.0–100.0)
Platelets: 185 10*3/uL (ref 150–400)
RBC: 2.88 MIL/uL — ABNORMAL LOW (ref 3.87–5.11)
RDW: 18 % — ABNORMAL HIGH (ref 11.5–15.5)
WBC: 5.7 10*3/uL (ref 4.0–10.5)

## 2015-02-14 LAB — RENAL FUNCTION PANEL
Albumin: 2.1 g/dL — ABNORMAL LOW (ref 3.5–5.0)
Anion gap: 5 (ref 5–15)
BUN: 12 mg/dL (ref 6–20)
CO2: 31 mmol/L (ref 22–32)
Calcium: 9 mg/dL (ref 8.9–10.3)
Chloride: 102 mmol/L (ref 101–111)
Creatinine, Ser: 5.16 mg/dL — ABNORMAL HIGH (ref 0.44–1.00)
GFR calc Af Amer: 11 mL/min — ABNORMAL LOW (ref >60)
GFR calc non Af Amer: 10 mL/min — ABNORMAL LOW (ref >60)
Glucose, Bld: 82 mg/dL (ref 65–99)
Phosphorus: 2.9 mg/dL (ref 2.5–4.6)
Potassium: 3.9 mmol/L (ref 3.5–5.1)
Sodium: 138 mmol/L (ref 135–145)

## 2015-02-14 LAB — PROTIME-INR
INR: 1.28 (ref 0.00–1.49)
Prothrombin Time: 16.1 seconds — ABNORMAL HIGH (ref 11.6–15.2)

## 2015-02-14 LAB — GLUCOSE, CAPILLARY: GLUCOSE-CAPILLARY: 71 mg/dL (ref 65–99)

## 2015-02-14 MED ORDER — DARBEPOETIN ALFA 200 MCG/0.4ML IJ SOSY
PREFILLED_SYRINGE | INTRAMUSCULAR | Status: AC
  Filled 2015-02-14: qty 0.4

## 2015-02-14 MED ORDER — LIDOCAINE-PRILOCAINE 2.5-2.5 % EX CREA: 1.0000 "application " | TOPICAL_CREAM | CUTANEOUS | Status: DC | PRN

## 2015-02-14 MED ORDER — SODIUM CHLORIDE 0.9 % IV SOLN: 100.0000 mL | INTRAVENOUS | Status: DC | PRN

## 2015-02-14 MED ORDER — WARFARIN SODIUM 5 MG PO TABS
5.0000 mg | ORAL_TABLET | Freq: Once | ORAL | Status: AC
  Administered 2015-02-14: 5 mg via ORAL
  Filled 2015-02-14: qty 1

## 2015-02-14 NOTE — Consult Note (Signed)
  Neuropsychology Psychosocial - Confidential Wise _____________________________________________________________________________   [name] is a 39 year old woman, who was previously seen for a psychodiagnostic evaluation in the setting of Lupus, blood infection, and prior stroke.  She was diagnosed at that time with Major Depressive Disorder, single episode and it was recommended that her physician consider initiating antidepressant medication.  Follow-up with the neuropsychologist was also recommended for continued psychological support while on the unit.    Since her last session with the neuropsychologist, [name] has purportedly been prescribed Celexa.  Today, she noted that her mood has dramatically improved with treatment with Celexa.  She commented that she has noticed that she is more optimistic and more alert than she was previously.  She also mentioned that she is "excited" about her discharge date and mostly about being home with her children.  Time was spent during the current session processing [name]'s self-perception.  She described several aspects of self-growth over the past week, including starting to learn to value herself and beginning the process of trying to prioritize her self-care the same way she prioritizes the care of others.  She was able to say that she understands that she is a "decent person" and she "deserves" happiness.  She also acknowledged that she understands that she is not to blame for her medical situation, as it "could have happened to anyone."  At this time, [name]'s main concerns surrounded therapy on the days she goes to dialysis.  She commented that her body is often too tired after all her therapy that she can tolerate only 2 hours of dialysis.  However, she said that if she were to go to dialysis first, then she would not have energy for therapy.  We discussed how there may not be an ideal solution, given  requirements for how much therapy she needs per day on this unit and she understood that even if the situation did not change, that she only has to tolerate it for a short time now.    Overall, [name]'s mood appears to be vastly improved over the past week.  Time was spend providing psychoeducation regarding likely mood shifts in the future and she seemed to understand.    DIAGNOSIS: Major Depressive Disorder, single episode, in remission  Greater than 50% of this visit was spent educating the patient about the possible diagnosis, prognosis, management plan, and in coordination of care.   Marlane Hatcher, PsyD Clinical Neuropsychologist

## 2015-02-14 NOTE — Progress Notes (Signed)
Social Work Patient ID: [name], female   DOB: Jul 24, 1975, 39 y.o.   MRN: [id number] Met with pt and spoke with Mom via telephone to discuss team conference goals and discharge date 11/22. Mom wanted to know if we set the date or pt did, she does not want her coming home too soon. Scheduled for her to attend therapies on Monday she would not commit to staying for more than an hour, due to having niece at home. She can not be there 24 hr with pt there will be times she is alone. Will address with team and work on the safest discharge plan for home.

## 2015-02-14 NOTE — Progress Notes (Signed)
Subjective/Complaints: Good night Occ loose stool, denies using miralax ROS:  No sweats chills, , no other jt pains  Celexa started 11/12 Objective: Vital Signs: Blood pressure 159/60, pulse 76, temperature 98.5 F (36.9 C), temperature source Oral, resp. rate 16, weight 84.4 kg (186 lb 1.1 oz), last menstrual period 01/21/2015, SpO2 99 %. No results found. Results for orders placed or performed during the hospital encounter of 02/04/15 (from the past 72 hour(s))  Occult blood card to lab, stool RN will collect     Status: None   Collection Time: 02/12/15  1:20 AM  Result Value Ref Range   Fecal Occult Bld NEGATIVE NEGATIVE  Occult blood card to lab, stool RN will collect     Status: None   Collection Time: 02/12/15  7:10 AM  Result Value Ref Range   Fecal Occult Bld NEGATIVE NEGATIVE  C difficile quick scan w PCR reflex     Status: None   Collection Time: 02/12/15  4:35 PM  Result Value Ref Range   C Diff antigen NEGATIVE NEGATIVE   C Diff toxin NEGATIVE NEGATIVE   C Diff interpretation Negative for toxigenic C. difficile   Occult blood card to lab, stool RN will collect     Status: None   Collection Time: 02/12/15  4:40 PM  Result Value Ref Range   Fecal Occult Bld NEGATIVE NEGATIVE  Renal function panel     Status: Abnormal   Collection Time: 02/12/15 11:29 PM  Result Value Ref Range   Sodium 136 135 - 145 mmol/L   Potassium 3.9 3.5 - 5.1 mmol/L   Chloride 99 (L) 101 - 111 mmol/L   CO2 29 22 - 32 mmol/L   Glucose, Bld 75 65 - 99 mg/dL   BUN 17 6 - 20 mg/dL   Creatinine, Ser 6.68 (H) 0.44 - 1.00 mg/dL   Calcium 8.6 (L) 8.9 - 10.3 mg/dL   Phosphorus 4.0 2.5 - 4.6 mg/dL   Albumin 2.1 (L) 3.5 - 5.0 g/dL   GFR calc non Af Amer 7 (L) >60 mL/min   GFR calc Af Amer 8 (L) >60 mL/min    Comment: (NOTE) The eGFR has been calculated using the CKD EPI equation. This calculation has not been validated in all clinical situations. eGFR's persistently <60 mL/min signify possible  Chronic Kidney Disease.    Anion gap 8 5 - 15  CBC     Status: Abnormal   Collection Time: 02/12/15 11:29 PM  Result Value Ref Range   WBC 5.6 4.0 - 10.5 K/uL   RBC 2.80 (L) 3.87 - 5.11 MIL/uL   Hemoglobin 8.2 (L) 12.0 - 15.0 g/dL   HCT 26.6 (L) 36.0 - 46.0 %   MCV 95.0 78.0 - 100.0 fL   MCH 29.3 26.0 - 34.0 pg   MCHC 30.8 30.0 - 36.0 g/dL   RDW 17.9 (H) 11.5 - 15.5 %   Platelets 193 150 - 400 K/uL  Glucose, capillary     Status: Abnormal   Collection Time: 02/13/15  4:04 AM  Result Value Ref Range   Glucose-Capillary 61 (L) 65 - 99 mg/dL  Glucose, capillary     Status: None   Collection Time: 02/13/15  5:22 AM  Result Value Ref Range   Glucose-Capillary 87 65 - 99 mg/dL  Protime-INR     Status: Abnormal   Collection Time: 02/13/15  2:25 PM  Result Value Ref Range   Prothrombin Time 16.0 (H) 11.6 - 15.2 seconds  INR 1.27 0.00 - 1.49     HEENT: normal Cardio: RRR and no murmur Resp: CTA B/L and unlabored GI: BS positive and NT, ND Extremity:  Pulses positive and No Edema Skin:   Wound Left thigh swollen aroung graft, no drainage from access site, mildly tender Neuro: Alert/Oriented, Normal Sensory and Abnormal Motor 4/5 Left delt, bi, tri, grip, 3- L hip flex, knee xt, ankle DF/PF Musc/Skel:  Swelling Left knee, no jt line tenderness, has L thigh swelling as well Gen NAD   Assessment/Plan: 1. Functional deficits secondary to right basal ganglia infarct with increased LLE weakness and debility from recent bacteremia which require 3+ hours per day of interdisciplinary therapy in a comprehensive inpatient rehab setting. Physiatrist is providing close team supervision and 24 hour management of active medical problems listed below. Physiatrist and rehab team continue to assess barriers to discharge/monitor patient progress toward functional and medical goals.  FIM: Function - Bathing Bathing activity did not occur: Refused Position: Sitting EOB Body parts bathed by  patient: Right arm, Left arm, Chest, Abdomen, Right upper leg, Left upper leg Body parts bathed by helper: Back, Front perineal area, Buttocks Bathing not applicable: Left lower leg, Right lower leg Assist Level:  (patient completed 6/8, 75%, FIM level 4, min assist)  Function- Upper Body Dressing/Undressing What is the patient wearing?: Hospital gown Pull over shirt/dress - Perfomed by patient: Thread/unthread right sleeve, Thread/unthread left sleeve, Put head through opening, Pull shirt over trunk Assist Level: Set up Set up : To obtain clothing/put away Function - Lower Body Dressing/Undressing What is the patient wearing?: Hospital Gown, Non-skid slipper socks Position: Sitting EOB Underwear - Performed by helper: Thread/unthread right underwear leg, Thread/unthread left underwear leg, Pull underwear up/down Pants- Performed by patient: Thread/unthread right pants leg, Thread/unthread left pants leg, Pull pants up/down Non-skid slipper socks- Performed by helper: Don/doff right sock, Don/doff left sock TED Hose - Performed by helper: Don/doff left TED hose Assist for footwear: Setup Assist for lower body dressing: Touching or steadying assistance (Pt > 75%)  Function - Toileting Toileting activity did not occur: Safety/medical concerns Toileting steps completed by patient: Adjust clothing prior to toileting, Performs perineal hygiene, Adjust clothing after toileting Toileting steps completed by helper: Adjust clothing prior to toileting, Performs perineal hygiene, Adjust clothing after toileting Assist level: Two helpers (At [facility])  Function - Air cabin crew transfer activity did not occur: N/A Toilet transfer assistive device: Bedside commode Assist level to bedside commode (at bedside): Moderate assist (Pt 50 - 74%/lift or lower) Assist level from bedside commode (at bedside): Moderate assist (Pt 50 - 74%/lift or lower)  Function - Chair/bed transfer Chair/bed transfer  method: Stand pivot Chair/bed transfer assist level: Touching or steadying assistance (Pt > 75%) Chair/bed transfer assistive device: Armrests, Walker Chair/bed transfer details: Verbal cues for safe use of DME/AE, Verbal cues for technique  Function - Locomotion: Wheelchair Will patient use wheelchair at discharge?: Yes Type: Manual Max wheelchair distance: 75 Assist Level: Supervision or verbal cues Wheel 50 feet with 2 turns activity did not occur: Safety/medical concerns Assist Level: Supervision or verbal cues Wheel 150 feet activity did not occur: Safety/medical concerns Assist Level: Touching or steadying assistance (Pt > 75%) Turns around,maneuvers to table,bed, and toilet,negotiates 3% grade,maneuvers on rugs and over doorsills: No Function - Locomotion: Ambulation Assistive device: Walker-rolling Max distance: 44 Assist level: Supervision or verbal cues Assist level: Supervision or verbal cues Walk 50 feet with 2 turns activity did not occur: Safety/medical concerns  Assist level: Touching or steadying assistance (Pt > 75%) Walk 150 feet activity did not occur: Safety/medical concerns Walk 10 feet on uneven surfaces activity did not occur: Safety/medical concerns Assist level: Touching or steadying assistance (Pt > 75%)  Function - Comprehension Comprehension: Auditory Comprehension assist level: Understands basic 90% of the time/cues < 10% of the time  Function - Expression Expression: Verbal Expression assist level: Expresses basic needs/ideas: With extra time/assistive device  Function - Social Interaction Social Interaction assist level: Interacts appropriately 90% of the time - Needs monitoring or encouragement for participation or interaction.  Function - Problem Solving Problem solving assist level: Solves basic 50 - 74% of the time/requires cueing 25 - 49% of the time  Function - Memory Memory assist level: Recognizes or recalls 25 - 49% of the time/requires  cueing 50 - 75% of the time Patient normally able to recall (first 3 days only): Current season, Staff names and faces, That he or she is in a hospital, Location of own room  Medical Problem List and Plan: 1. Functional deficits secondary to right basal ganglia infarct and debility from recent bacteremia.  Increased weakness noted in LLE ? Related to decondtioning vs new infarct 2.  DVT Prophylaxis/Anticoagulation: Chronic Coumadin resumed, per Dr Beryle Beams, start 82m and titrate up slowly, blood draws difficult, will do 3 times a week INR in HD I discussed this complex case with Dr. GEinar Gipfrom cardiology as well as Dr. GBeryle Beamsfrom hematology. Consensus is to do anticoagulation.Hematology recommends no loading dose for warfarin. Start at 5 mg and titrate to INR between 2 and 3 Because of end-stage renal disease cannot do Factor X a inhibitors 3. Pain Management: Oxycodone and Flexeril as needed.  4. ID/Pseudomonas sepsis. Completed Fortaz 02/09/2015,afeb 5. Neuropsych: This patient is capable of making decisions on her own behalf. 6. Skin/Wound Care: Routine skin checks 7. Fluids/Electrolytes/Nutrition: Routine I/Os with follow-up chemistries 8. End-stage renal disease/lupus nephritis. Continue hemodialysis as per renal services, uses new Left femoral graft for HD, Nephro to address pt's HD/TRansplant questions        9. Orthostatic hypotension.Midodrine 10 mg twice a day 10. Chronic anemia. Continue Aranesp 11. Hyperlipidemia. Lipitor 12.  Mood and affect depression screen is positive,started low dose celexa, may cause nausea which is improving 13.  Lupus pleuritis- d/w with nephro and pt ,  corticosteroid burst for 5 days prednisone 223md, occ CP with deep insp, off prednisone 14.  Knee pain , left improved ( may be related to prednisone) 15.  Diarrhea post abx- occ no Cdiff LOS (Days) 10 A FACE TO FACE EVALUATION WAS PERFORMED  KIRSTEINS,ANDREW E 02/14/2015, 7:24 AM

## 2015-02-14 NOTE — Progress Notes (Signed)
Occupational Therapy Session Note  Patient Details  Name: [name] MRN: [id number] Date of Birth: 22-Jun-1975  Today's Date: 02/14/2015 OT Individual Time: 1030-1100 OT Individual Time Calculation (min): 30 min    Short Term Goals: Week 1:  OT Short Term Goal 1 (Week 1): Pt will perform LB bathing sit to stand with min assist and AE.  OT Short Term Goal 1 - Progress (Week 1): Met OT Short Term Goal 2 (Week 1): Pt will perform LB dressing sit to stand with min assist and AE PRN. OT Short Term Goal 2 - Progress (Week 1): Met OT Short Term Goal 3 (Week 1): Pt will perform toilet transfers with min assist 3 consecutive sessions. OT Short Term Goal 3 - Progress (Week 1): Met OT Short Term Goal 4 (Week 1): Pt will perform walk-in shower transfer with min assist using the RW.  OT Short Term Goal 4 - Progress (Week 1): Discontinued (comment) (Pt has new dialysis port site and does not want to shower.) OT Short Term Goal 5 (Week 1): Pt will perform BUE light resistance exercise with supervision following written exercise plan.   OT Short Term Goal 5 - Progress (Week 1): Met Week 2:  OT Short Term Goal 1 (Week 2): STG = LTGs due to remaining LOS  Skilled Therapeutic Interventions/Progress Updates:    Pt seen this session to address functional mobility, activity tolerance, UE strength.  Reviewed her LTGs and plans for discharge. Pt worked on sit><stands from EOB to 4WW with S, ambulating 5 feet to w/c with walker S with only min cues for technique.  Propelled w/c 30 feet and then worked on AROM UE exercises with cues for technique. Used pilates based exercises and Dowel exercises.  Pt's PT arrived for her next session.  Therapy Documentation Precautions:  Precautions Precautions: Fall Precaution Comments: Pt's femoral cath is tunneled therefore she is able to get OOB; L knee joint pain Restrictions Weight Bearing Restrictions: No    Pain: Pain Assessment Pain Assessment: No/denies  pain Pain Score: 0-No pain ADL:  See Function Navigator for Current Functional Status.   Therapy/Group: Individual Therapy  SAGUIER,JULIA 02/14/2015, 1:03 PM

## 2015-02-14 NOTE — Plan of Care (Signed)
Problem: RH Tub/Shower Transfers Goal: LTG Patient will perform tub/shower transfers w/assist (OT) LTG: Patient will perform tub/shower transfers with assist, with/without cues using equipment (OT)  LTG modified to simulation only as pt does not want to shower at this time due to new dialysis port.

## 2015-02-14 NOTE — Progress Notes (Signed)
ANTICOAGULATION CONSULT NOTE - Follow up Pharmacy Consult for coumadin Indication: hx of SLE and hypercoagulable state  Allergies  Allergen Reactions  . Food Swelling    Red peppers    Patient Measurements: Weight: 188 lb 0.8 oz (85.3 kg)    Vital Signs: Temp: 98 F (36.7 C) (11/17 1517) Temp Source: Oral (11/17 1517) BP: 134/87 mmHg (11/17 1630) Pulse Rate: 89 (11/17 1630)  Labs:  Recent Labs  02/12/15 2329 02/13/15 1425 02/14/15 1615  HGB 8.2*  --  8.1*  HCT 26.6*  --  28.0*  PLT 193  --  185  LABPROT  --  16.0* 16.1*  INR  --  1.27 1.28  CREATININE 6.68*  --  5.16*    Estimated Creatinine Clearance: 15.2 mL/min (by C-G formula based on Cr of 5.16).   Medical History: Past Medical History  Diagnosis Date  . Hypertension   . Cardiac arrhythmia   . SVT (supraventricular tachycardia) ([place])     "today, last week, 2 wk ago; maybe 1 month ago, etc; started w/in last 3-4 yrs"(07/20/2012)  . Fainting     "~ 1 month ago; probably related to SVT" (07/20/2012)  . Shortness of breath     "related to SVT episodes" (07/20/2012)  . Chest pain at rest     "related to SVT" (07/20/2012)  . Lupus (systemic lupus erythematosus) ([place])   . GERD (gastroesophageal reflux disease)   . Fibromyalgia   . Irritable bowel syndrome (IBS)   . Hypercholesterolemia   . Heart murmur     "small" (12/25/2014)  . TIA (transient ischemic attack) 12/21/2014  . Sleep apnea     "don't wear mask anymore" (12/25/2014)  . Anemia   . History of blood transfusion "several"    "related to low counts"  . Daily headache   . Arthritis     "hips" (12/25/2014)  . Anxiety     "sometimes; don't take anything for it" (12/25/2014)  . ESRD (end stage renal disease) on dialysis ([place]) since 12/07/2014    Diagnosed Aug 2014 w SLE nephritis DPGN by biopsy, rx cellcept/ steroids.  Repeat biopsy early 2016 membranous w/o activity so meds weaned off. Big flare Aug '16 creat 6, 3rd biopsy DPGN, meds resumed. Ended up  starting HD Sept 2016  . Stroke ([place])   . History of vascular access device     2016: Sept 9  R IJ cath per IR.  Sept 20 not candidate for fistula due to disease veins, had LUA hybrid graft placed. Sept 21 - steal syndrome, LUA AVG ligated. Oct 7 - new left femoral Gore-tex loop graft per VVS. Oct 29 - removal R IJ tunneled HD cath    Medications:  Scheduled:  . atorvastatin  20 mg Oral q1800  . citalopram  10 mg Oral Daily  . darbepoetin (ARANESP) injection - DIALYSIS  200 mcg Intravenous Q Thu-HD  . feeding supplement (PRO-STAT SUGAR FREE 64)  30 mL Oral BID  . heparin  2,600 Units Dialysis Once in dialysis  . hydroxychloroquine  400 mg Oral Daily  . metoprolol succinate  25 mg Oral QHS  . midodrine  10 mg Oral 2 times per day on Tue Thu Sat  . midodrine  10 mg Oral 2 times per day on Sun Mon Wed Fri  . multivitamin  1 tablet Oral QHS  . pantoprazole  40 mg Oral Daily  . sevelamer carbonate  1.6 g Oral TID WC  . Warfarin - Pharmacist Dosing Inpatient  Does not apply q1800   Infusions:    Assessment: 39 yo female with hx of SLE and hypercoagulable state. Resumed coumadin on 02/13/16 per Dr. Letta Pate discussion with cardiologist and hematologist. Consensus was to do anticoagulation with coumadin. Dr. Beryle Beams, hematologist recommends no loading dose for warfarin. Start at 5 mg and titrate up slowly to INR between 2 and 3 because of ESRD cannot do Factor X a inhibitors   INR was 2.21 on 01/28/15.  Today 11/17 the INR is 1.28 after coumadin resumed 02/13/15.  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  No bleeding noted.  Hgb 8.1 low/stable, PLTC 185K stable.  Goal of Therapy:  INR 2-3 Monitor platelets by anticoagulation protocol: Yes   Plan:  - coumadin 5 mg po x1  - daily INR - watch for bleeding closely  Thank you for allowing pharmacy to be part of this patients care team. Nicole Cella, RPh Clinical Pharmacist Pager:  [phone] 02/14/2015,5:11 PM

## 2015-02-14 NOTE — Progress Notes (Signed)
Occupational Therapy Session Note  Patient Details  Name: [name] MRN: [id number] Date of Birth: December 27, 1975  Today's Date: 02/14/2015 OT Individual Time: 0815-0900 OT Individual Time Calculation (min): 45 min    Short Term Goals: Week 2:  OT Short Term Goal 1 (Week 2): STG = LTGs due to remaining LOS  Skilled Therapeutic Interventions/Progress Updates:    Pt seen for OT ADL bathing and dressing session. Pt in supine upon arrival, agreeable to tx session. Extensive time and effort spent on bed mobility. Pt unable to bring trunk upright off bed due to decreased UE strength. Max cuing provided for log rolling technique, however, pt unable to power up. Yoga blocks provided for pt to push from on bed to increased leverage. Pt with increased success in coming up with blocks, however, cont to require assist to come into full upright sitting position. She ambulated throughout room with rollator and supervision. She bathed seated at sink, sitting on rollator stool. Supervision required for standing LB care. Hospital gown donned. She stood at sink to complete oral care with emphasis on functional standing balance and endurance, tolerating ~1-2 minutes of static standing. She returned to bed at end of session, requiring increased assist and cues to return to supine. Pt left in supine at end of session, all needs in reach.   Therapy Documentation Precautions:  Precautions Precautions: Fall Precaution Comments: Pt's femoral cath is tunneled therefore she is able to get OOB; L knee joint pain Restrictions Weight Bearing Restrictions: No Pain: Pain Assessment Pain Assessment: No/denies pain  See Function Navigator for Current Functional Status.   Therapy/Group: Individual Therapy  Lewis, Lanna Labella C 02/14/2015, 7:12 AM

## 2015-02-14 NOTE — Progress Notes (Signed)
Physical Therapy Session Note  Patient Details  Name: [name] MRN: [id number] Date of Birth: 01-24-1976  Today's Date: 02/14/2015 PT Individual Time: 1100-1200 Treatment Session 2: 1400-1500 PT Individual Time Calculation (min): 60 min Treatment Session 2: 60 min  Short Term Goals: Week 2:  PT Short Term Goal 1 (Week 2): = LTG due to estimated LOS  Skilled Therapeutic Interventions/Progress Updates:    Treatment Session 1: Pt received up in w/c immediately after OT session, agreeable to PT, but stating that she was instructed not to do too much on her L leg on dialysis days (today is a dialysis day). Gait Training - Pt agreeable to one bout of ambulation. Pt instructs pt in ambulation with 4WW x 15' req close SBA - uncompensated bilateral [place] gait noted, but no LOB req assist to correct. Near end of session, pt agrees to one more bout of gait and ambulates 51' with one short standing rest break in same manner as described previously. W/C Management - PT instructs pt in technique to find legrest swing away lever, but pt continues to req hand over hand assist to manage legrests, as well as verbal cues to find brakes in order to lock/unlock. PT instructs pt in w/c propulsion with B UEs x 40' + 50' + 45' req intermittent min A, specifically with Hand over hand technique of holding one wheel tight while performing a sharp turn with the other UE. Therapeutic Activity - Pt req multiple attempts for sit to stand from w/c with 4WW, ultimately req CGA for safety. PT instructs pt in car transfer with armrest assistance req CGA into low car (TranSit simulated at lowest floor to seat height), pt demonstrates ability to get own legs in & out of car with verbal encouragement and increased time. Pt req min A boost for sit to stand from low car, then CGA to step transfer back to w/c. Pt ended up in w/c with all needs in reach, talking to SW, awaiting lunch.   Treatment Session 2: Pt received in  bed - reporting very fatigued and had just lain down after lunch. Pt agreeable to bed level exercises for B LE ROM and strengthening. Therapeutic Exercise: ankle pumps x 15, ankle circles each direction x 15, heel slides x 15, hip ER/IR in hook lie x 15, bridges with PT stabilizing feet x 15, SLR x 15 (AAROM), hip IR/ER with knees straight x 15, supine hip abduction/adduction x 15. Therapeutic Activity - PT instructs pt in bed mobility: pt uses momentum to roll L in flat bed without rail req increased time and SBA/verbal encouragement, L side lie to sit req min A to initiate sitting up, but once propped on L elbow pt finishes sitting up fully on eob with increased time from flat bed without rails. Pt scoots towards HOB in sit with supervision - full bottom clearance each scoot. Pt completes sit to supine transfer in bed with encouragement and SBA - getting both legs into bed on her own. Pt ended comfortably in bed, preparing for transfer to dialysis. Continue per PT POC.    Therapy Documentation Precautions:  Precautions Precautions: Fall Precaution Comments: Pt's femoral cath is tunneled therefore she is able to get OOB; L knee joint pain Restrictions Weight Bearing Restrictions: No Pain: Pain Assessment Pain Assessment: No/denies pain Pain Score: 0-No pain Treatment Session 2: Pt denies pain, but c/o queasiness - pt calls RN and asks for some Tums.    See Function Navigator for Current Functional Status.  Therapy/Group: Individual Therapy  Taegan Haider M 02/14/2015, 11:17 AM

## 2015-02-15 ENCOUNTER — Inpatient Hospital Stay (HOSPITAL_COMMUNITY): Payer: Medicaid Other | Admitting: Occupational Therapy

## 2015-02-15 ENCOUNTER — Inpatient Hospital Stay (HOSPITAL_COMMUNITY): Payer: Medicaid Other | Admitting: Physical Therapy

## 2015-02-15 LAB — PROTIME-INR
INR: 1.38 (ref 0.00–1.49)
Prothrombin Time: 17.1 seconds — ABNORMAL HIGH (ref 11.6–15.2)

## 2015-02-15 MED ORDER — WARFARIN SODIUM 5 MG PO TABS: 5.0000 mg | ORAL_TABLET | Freq: Once | ORAL | Status: DC

## 2015-02-15 MED ORDER — WARFARIN SODIUM 5 MG PO TABS
5.0000 mg | ORAL_TABLET | Freq: Once | ORAL | Status: AC
  Administered 2015-02-15: 5 mg via ORAL
  Filled 2015-02-15: qty 1

## 2015-02-15 NOTE — Progress Notes (Signed)
Occupational Therapy Session Note  Patient Details  Name: [name] MRN: [id number] Date of Birth: 11-02-1975  Today's Date: 02/15/2015 OT Individual Time: 1445-1500 OT Individual Time Calculation (min): 15 min  and Today's Date: 02/15/2015 OT Missed Time: 30 Minutes Missed Time Reason: Pain   Short Term Goals: Week 2:  OT Short Term Goal 1 (Week 2): STG = LTGs due to remaining LOS  Skilled Therapeutic Interventions/Progress Updates:    Pt refused scheduled OT session due to feeling pain in RLE from dialysis and just "not feeling well".  Educated on purpose of therapy and upcoming d/c plans.  Discussed use of AE to assist with self-care tasks and encouraged pt to advocate for herself with her pain management, medications, and overall awareness of medical conditions.  Pt appreciative of therapist visit and reports she plans to "do better" tomorrow.  Pt missed 30 mins skilled OT.  Therapy Documentation Precautions:  Precautions Precautions: Fall Precaution Comments: Pt's femoral cath is tunneled therefore she is able to get OOB; L knee joint pain Restrictions Weight Bearing Restrictions: No General: General OT Amount of Missed Time: 30 Minutes Vital Signs: Therapy Vitals Temp: 98.3 F (36.8 C) Temp Source: Oral Pulse Rate: 88 Resp: 18 BP: 125/67 mmHg Patient Position (if appropriate): Lying Oxygen Therapy SpO2: 99 % O2 Device: Not Delivered Pain:  Pt with no pain at rest, but reporting prior pain in leg at HD site.  See Function Navigator for Current Functional Status.   Therapy/Group: Individual Therapy  Simonne Come 02/15/2015, 3:28 PM

## 2015-02-15 NOTE — Plan of Care (Signed)
Problem: RH Bathing Goal: LTG Patient will bathe with assist, cues/equipment (OT) LTG: Patient will bathe specified number of body parts with assist with/without cues using equipment (position) (OT)  LTG of bathing modified to min A versus S.  Problem: RH Dressing Goal: LTG Patient will perform lower body dressing w/assist (OT) LTG: Patient will perform lower body dressing with assist, with/without cues in positioning using equipment (OT)  LTG of LB dressing modified from supervision to min A as she needs A with her socks and his not able to use sock aid at this time due to edema.

## 2015-02-15 NOTE — Progress Notes (Signed)
Physical Therapy Note  Patient Details  Name: [name] MRN: [id number] Date of Birth: 1975-11-20 Today's Date: 02/15/2015    Time: 1100-1110 10 minutes (missed 50 minutes)  1:1 Pt refused skilled PT due to feeling pain in Rt LE from dialysis the previous night. PT and pt discussed d/c plans and importance of family education. Pt verbalizes understanding.  PT encouraged pt to get OOB and sit up for meals, pt states she is concerned about her leg due to episode in dialysis last night.  Pt missed 50 minutes skilled PT.   Tram Wrenn 02/15/2015, 11:28 AM

## 2015-02-15 NOTE — Progress Notes (Signed)
Subjective/Complaints: Had excess bleeding from left thigh during vascular access in HD, resolved with holding pressure Appreciate psych note ROS:  No sweats chills, , no other jt pains  Celexa started 11/12 Objective: Vital Signs: Blood pressure 128/65, pulse 106, temperature 98.3 F (36.8 C), temperature source Oral, resp. rate 17, weight 82.5 kg (181 lb 14.1 oz), last menstrual period 01/21/2015, SpO2 100 %. No results found. Results for orders placed or performed during the hospital encounter of 02/04/15 (from the past 72 hour(s))  Occult blood card to lab, stool RN will collect     Status: None   Collection Time: 02/12/15  7:10 AM  Result Value Ref Range   Fecal Occult Bld NEGATIVE NEGATIVE  C difficile quick scan w PCR reflex     Status: None   Collection Time: 02/12/15  4:35 PM  Result Value Ref Range   C Diff antigen NEGATIVE NEGATIVE   C Diff toxin NEGATIVE NEGATIVE   C Diff interpretation Negative for toxigenic C. difficile   Occult blood card to lab, stool RN will collect     Status: None   Collection Time: 02/12/15  4:40 PM  Result Value Ref Range   Fecal Occult Bld NEGATIVE NEGATIVE  Renal function panel     Status: Abnormal   Collection Time: 02/12/15 11:29 PM  Result Value Ref Range   Sodium 136 135 - 145 mmol/L   Potassium 3.9 3.5 - 5.1 mmol/L   Chloride 99 (L) 101 - 111 mmol/L   CO2 29 22 - 32 mmol/L   Glucose, Bld 75 65 - 99 mg/dL   BUN 17 6 - 20 mg/dL   Creatinine, Ser 6.68 (H) 0.44 - 1.00 mg/dL   Calcium 8.6 (L) 8.9 - 10.3 mg/dL   Phosphorus 4.0 2.5 - 4.6 mg/dL   Albumin 2.1 (L) 3.5 - 5.0 g/dL   GFR calc non Af Amer 7 (L) >60 mL/min   GFR calc Af Amer 8 (L) >60 mL/min    Comment: (NOTE) The eGFR has been calculated using the CKD EPI equation. This calculation has not been validated in all clinical situations. eGFR's persistently <60 mL/min signify possible Chronic Kidney Disease.    Anion gap 8 5 - 15  CBC     Status: Abnormal   Collection  Time: 02/12/15 11:29 PM  Result Value Ref Range   WBC 5.6 4.0 - 10.5 K/uL   RBC 2.80 (L) 3.87 - 5.11 MIL/uL   Hemoglobin 8.2 (L) 12.0 - 15.0 g/dL   HCT 26.6 (L) 36.0 - 46.0 %   MCV 95.0 78.0 - 100.0 fL   MCH 29.3 26.0 - 34.0 pg   MCHC 30.8 30.0 - 36.0 g/dL   RDW 17.9 (H) 11.5 - 15.5 %   Platelets 193 150 - 400 K/uL  Glucose, capillary     Status: Abnormal   Collection Time: 02/13/15  4:04 AM  Result Value Ref Range   Glucose-Capillary 61 (L) 65 - 99 mg/dL  Glucose, capillary     Status: None   Collection Time: 02/13/15  5:22 AM  Result Value Ref Range   Glucose-Capillary 87 65 - 99 mg/dL  Protime-INR     Status: Abnormal   Collection Time: 02/13/15  2:25 PM  Result Value Ref Range   Prothrombin Time 16.0 (H) 11.6 - 15.2 seconds   INR 1.27 0.00 - 1.49  Protime-INR     Status: Abnormal   Collection Time: 02/14/15  4:15 PM  Result Value Ref Range  Prothrombin Time 16.1 (H) 11.6 - 15.2 seconds   INR 1.28 0.00 - 1.49  Renal function panel     Status: Abnormal   Collection Time: 02/14/15  4:15 PM  Result Value Ref Range   Sodium 138 135 - 145 mmol/L   Potassium 3.9 3.5 - 5.1 mmol/L   Chloride 102 101 - 111 mmol/L   CO2 31 22 - 32 mmol/L   Glucose, Bld 82 65 - 99 mg/dL   BUN 12 6 - 20 mg/dL   Creatinine, Ser 5.16 (H) 0.44 - 1.00 mg/dL   Calcium 9.0 8.9 - 10.3 mg/dL   Phosphorus 2.9 2.5 - 4.6 mg/dL   Albumin 2.1 (L) 3.5 - 5.0 g/dL   GFR calc non Af Amer 10 (L) >60 mL/min   GFR calc Af Amer 11 (L) >60 mL/min    Comment: (NOTE) The eGFR has been calculated using the CKD EPI equation. This calculation has not been validated in all clinical situations. eGFR's persistently <60 mL/min signify possible Chronic Kidney Disease.    Anion gap 5 5 - 15  CBC     Status: Abnormal   Collection Time: 02/14/15  4:15 PM  Result Value Ref Range   WBC 5.7 4.0 - 10.5 K/uL   RBC 2.88 (L) 3.87 - 5.11 MIL/uL   Hemoglobin 8.1 (L) 12.0 - 15.0 g/dL   HCT 28.0 (L) 36.0 - 46.0 %   MCV 97.2 78.0  - 100.0 fL   MCH 28.1 26.0 - 34.0 pg   MCHC 28.9 (L) 30.0 - 36.0 g/dL   RDW 18.0 (H) 11.5 - 15.5 %   Platelets 185 150 - 400 K/uL  Glucose, capillary     Status: None   Collection Time: 02/14/15  8:21 PM  Result Value Ref Range   Glucose-Capillary 71 65 - 99 mg/dL     HEENT: normal Cardio: RRR and no murmur Resp: CTA B/L and unlabored GI: BS positive and NT, ND Extremity:  Pulses positive and No Edema Skin:   Wound Left thigh swollen aroung graft, no drainage from access site, mildly tender Neuro: Alert/Oriented, Normal Sensory and Abnormal Motor 4/5 Left delt, bi, tri, grip, 3- L hip flex, knee xt, ankle DF/PF Musc/Skel:  Swelling Left knee, no jt line tenderness, has L thigh swelling as well Gen NAD   Assessment/Plan: 1. Functional deficits secondary to right basal ganglia infarct with increased LLE weakness and debility from recent bacteremia which require 3+ hours per day of interdisciplinary therapy in a comprehensive inpatient rehab setting. Physiatrist is providing close team supervision and 24 hour management of active medical problems listed below. Physiatrist and rehab team continue to assess barriers to discharge/monitor patient progress toward functional and medical goals.  FIM: Function - Bathing Bathing activity did not occur: Refused Position: Wheelchair/chair at sink Body parts bathed by patient: Right arm, Left arm, Chest, Abdomen, Front perineal area, Buttocks Body parts bathed by helper: Back Bathing not applicable: Left lower leg, Right lower leg, Right upper leg, Left upper leg Assist Level:  (patient completed 6/8, 75%, FIM level 4, min assist)  Function- Upper Body Dressing/Undressing What is the patient wearing?: Hospital gown Pull over shirt/dress - Perfomed by patient: Thread/unthread right sleeve, Thread/unthread left sleeve, Put head through opening, Pull shirt over trunk Assist Level: Set up Set up : To obtain clothing/put away Function - Lower  Body Dressing/Undressing What is the patient wearing?: Hospital Gown Position: Sitting EOB Underwear - Performed by helper: Thread/unthread right underwear leg, Thread/unthread  left underwear leg, Pull underwear up/down Pants- Performed by patient: Thread/unthread right pants leg, Thread/unthread left pants leg, Pull pants up/down Non-skid slipper socks- Performed by helper: Don/doff right sock, Don/doff left sock TED Hose - Performed by helper: Don/doff left TED hose Assist for footwear: Setup Assist for lower body dressing: Touching or steadying assistance (Pt > 75%)  Function - Toileting Toileting activity did not occur: N/A Toileting steps completed by patient: Adjust clothing prior to toileting, Performs perineal hygiene, Adjust clothing after toileting Toileting steps completed by helper: Adjust clothing prior to toileting, Performs perineal hygiene, Adjust clothing after toileting Assist level: Two helpers (At [facility])  Function - Air cabin crew transfer activity did not occur: N/A Toilet transfer assistive device: Bedside commode Assist level to bedside commode (at bedside): Moderate assist (Pt 50 - 74%/lift or lower) Assist level from bedside commode (at bedside): Moderate assist (Pt 50 - 74%/lift or lower)  Function - Chair/bed transfer Chair/bed transfer method: Ambulatory Chair/bed transfer assist level: Supervision or verbal cues Chair/bed transfer assistive device: Armrests, Walker Chair/bed transfer details: Verbal cues for technique  Function - Locomotion: Wheelchair Will patient use wheelchair at discharge?: Yes Type: Manual Max wheelchair distance: 50' Assist Level: Touching or steadying assistance (Pt > 75%) Wheel 50 feet with 2 turns activity did not occur: Safety/medical concerns Assist Level: Touching or steadying assistance (Pt > 75%) Wheel 150 feet activity did not occur: Safety/medical concerns Assist Level: Touching or steadying assistance (Pt >  75%) Turns around,maneuvers to table,bed, and toilet,negotiates 3% grade,maneuvers on rugs and over doorsills: No Function - Locomotion: Ambulation Assistive device: Walker-rolling Max distance: 46' Assist level: Supervision or verbal cues Assist level: Supervision or verbal cues Walk 50 feet with 2 turns activity did not occur: Safety/medical concerns Assist level: Supervision or verbal cues Walk 150 feet activity did not occur: Safety/medical concerns Walk 10 feet on uneven surfaces activity did not occur: Safety/medical concerns Assist level: Touching or steadying assistance (Pt > 75%)  Function - Comprehension Comprehension: Auditory Comprehension assist level: Understands basic 90% of the time/cues < 10% of the time  Function - Expression Expression: Verbal Expression assist level: Expresses basic needs/ideas: With extra time/assistive device  Function - Social Interaction Social Interaction assist level: Interacts appropriately 90% of the time - Needs monitoring or encouragement for participation or interaction.  Function - Problem Solving Problem solving assist level: Solves basic 50 - 74% of the time/requires cueing 25 - 49% of the time  Function - Memory Memory assist level: Recognizes or recalls 25 - 49% of the time/requires cueing 50 - 75% of the time Patient normally able to recall (first 3 days only): Current season, Staff names and faces, That he or she is in a hospital, Location of own room  Medical Problem List and Plan: 1. Functional deficits secondary to right basal ganglia infarct and debility from recent bacteremia.  Increased weakness noted in LLE ? Related to decondtioning vs new infarct 2.  DVT Prophylaxis/Anticoagulation: Chronic Coumadin resumed, per Dr Beryle Beams, start 61m and titrate up slowly, blood draws difficult, will do 3 times a week INR in HD I discussed this complex case with Dr. GEinar Gipfrom cardiology as well as Dr. GBeryle Beamsfrom hematology.  Consensus is to do anticoagulation.Hematology recommends no loading dose for warfarin. Start at 5 mg and titrate to INR between 2 and 3 Bleeding in HD not related given INR only 1.27             3. Pain Management: Oxycodone and  Flexeril as needed.  4. ID/Pseudomonas sepsis. Completed Fortaz 02/09/2015,afeb 5. Neuropsych: This patient is capable of making decisions on her own behalf. 6. Skin/Wound Care: Routine skin checks 7. Fluids/Electrolytes/Nutrition: Routine I/Os with follow-up chemistries 8. End-stage renal disease/lupus nephritis. Continue hemodialysis as per renal services, uses new Left femoral graft for HD, Nephro to address pt's HD/TRansplant questions        9. Orthostatic hypotension.Midodrine 10 mg twice a day 10. Chronic anemia. Continue Aranesp 11. Hyperlipidemia. Lipitor 12.  Mood and affect depression screen is positive,started low dose celexa, affect already improving 13.  Lupus pleuritis- resolved, off prednisone 14.  Knee pain , left improved ( may be related to prednisone) 15.  Diarrhea post abx- occ no Cdiff LOS (Days) 11 A FACE TO FACE EVALUATION WAS PERFORMED  [name] 02/15/2015, 7:03 AM

## 2015-02-15 NOTE — Progress Notes (Signed)
Occupational Therapy Note  Patient Details  Name: Lark Torbeck MRN: [id number] Date of Birth: 19-Jul-1975  Today's Date: 02/15/2015 OT Missed Time: 63 Minutes Missed Time Reason: Pain  Pt missed 45 min of skilled OT due to pain. Left pt resting in bed.    Willeen Cass [facility] 02/15/2015, 3:30 PM

## 2015-02-15 NOTE — Progress Notes (Signed)
ANTICOAGULATION CONSULT NOTE - Follow Up Consult  Pharmacy Consult for coumadin Indication: hypercoagulable state, hx of SLE  Allergies  Allergen Reactions  . Food Swelling    Red peppers    Patient Measurements: Weight: 181 lb 14.1 oz (82.5 kg) Heparin Dosing Weight:   Vital Signs: Temp: 98.3 F (36.8 C) (11/18 0529) Temp Source: Oral (11/18 0529) BP: 128/65 mmHg (11/18 0529) Pulse Rate: 106 (11/18 0529)  Labs:  Recent Labs  02/12/15 2329 02/13/15 1425 02/14/15 1615  HGB 8.2*  --  8.1*  HCT 26.6*  --  28.0*  PLT 193  --  185  LABPROT  --  16.0* 16.1*  INR  --  1.27 1.28  CREATININE 6.68*  --  5.16*    Estimated Creatinine Clearance: 14.9 mL/min (by C-G formula based on Cr of 5.16).   Medications:  Scheduled:  . atorvastatin  20 mg Oral q1800  . citalopram  10 mg Oral Daily  . darbepoetin (ARANESP) injection - DIALYSIS  200 mcg Intravenous Q Thu-HD  . feeding supplement (PRO-STAT SUGAR FREE 64)  30 mL Oral BID  . heparin  2,600 Units Dialysis Once in dialysis  . hydroxychloroquine  400 mg Oral Daily  . metoprolol succinate  25 mg Oral QHS  . midodrine  10 mg Oral 2 times per day on Tue Thu Sat  . midodrine  10 mg Oral 2 times per day on Sun Mon Wed Fri  . multivitamin  1 tablet Oral QHS  . pantoprazole  40 mg Oral Daily  . sevelamer carbonate  1.6 g Oral TID WC  . Warfarin - Pharmacist Dosing Inpatient   Does not apply q1800   Infusions:    Assessment: 39 yo female with hypercoagulable state, hx of SLE is currently on subtherapeutic coumadin.  INR today is 1.38. Patient had bleeding from HD access yesterday but now resolved.  Team wants to dose coumadin cautiously  Goal of Therapy:  INR 2-3 Monitor platelets by anticoagulation protocol: Yes   Plan:  - coumadin 5 mg po x1 - INR in am  Halie Gass, Tsz-Yin 02/15/2015,8:17 AM

## 2015-02-15 NOTE — Progress Notes (Signed)
Occupational Therapy Session Note  Patient Details  Name: [name] MRN: [id number] Date of Birth: 06/07/1975  Today's Date: 02/15/2015 OT Individual Time: 0600-4599 OT Individual Time Calculation (min): 10 min missed 35 min due to pain   Short Term Goals: Week 1:  OT Short Term Goal 1 (Week 1): Pt will perform LB bathing sit to stand with min assist and AE.  OT Short Term Goal 1 - Progress (Week 1): Met OT Short Term Goal 2 (Week 1): Pt will perform LB dressing sit to stand with min assist and AE PRN. OT Short Term Goal 2 - Progress (Week 1): Met OT Short Term Goal 3 (Week 1): Pt will perform toilet transfers with min assist 3 consecutive sessions. OT Short Term Goal 3 - Progress (Week 1): Met OT Short Term Goal 4 (Week 1): Pt will perform walk-in shower transfer with min assist using the RW.  OT Short Term Goal 4 - Progress (Week 1): Discontinued (comment) (Pt has new dialysis port site and does not want to shower.) OT Short Term Goal 5 (Week 1): Pt will perform BUE light resistance exercise with supervision following written exercise plan.   OT Short Term Goal 5 - Progress (Week 1): Met Week 2:  OT Short Term Goal 1 (Week 2): STG = LTGs due to remaining LOS      Skilled Therapeutic Interventions/Progress Updates:    Pt resting in bed stating she had a bad experience in dialysis last night. She was crying and talking about how upsetting it was for her and now she has significant L leg pain. Pt stated that she had to use the bed pan as she could not tolerate getting out of bed.  Reviewed the progress she has made and her OT goals to prepare for discharge. Encouraged her to relax so she could be ready for her 10am session. Pt stating she would not be able to participate then either, but strongly encouraged her to try. Pt agreed she would.   Therapy Documentation Precautions:  Precautions Precautions: Fall Precaution Comments: Pt's femoral cath is tunneled therefore she is able to  get OOB; L knee joint pain Restrictions Weight Bearing Restrictions: No  General OT Amount of Missed Time: 35 Minutes Vital Signs: Therapy Vitals Temp: 98.3 F (36.8 C) Temp Source: Oral Pulse Rate: (!) 106 Resp: 17 BP: 128/65 mmHg Patient Position (if appropriate): Lying Oxygen Therapy SpO2: 100 % O2 Device: Not Delivered Pain: Pain Assessment Pain Assessment: 0-10 Pain Score: 8  Pain Type: Acute pain Pain Location: Leg Pain Orientation: Left Pain Descriptors / Indicators: Aching Pain Onset: On-going ADL: See Function Navigator for Current Functional Status.   Therapy/Group: Individual Therapy  Lenora Gomes 02/15/2015, 9:05 AM

## 2015-02-15 NOTE — Progress Notes (Addendum)
[facility] Progress Note  Assessment: 1. Pseudomonas cath sepsis: cath removed/ replaced, last dose of Fortaz 11/12 2. ESRD: TTS HD , using L thigh AVG now. Starting on coumadin for hypercoag state 3. Anemia of chronic kidney disease: 8.3. Max ESA dosing. Follow CBC. 4. Metabolic bone disease: on renvela [facility] for control of phosphorus. Not on vitamin D receptor analogue. 5. Hypotension: midodrine on HD days only (10 bid TTS) 6. Malnutrition/deconditioning: Continue inpatient rehabilitation with dietary nutritional supplementation. Albumin 1.8 7. Chest pain: appears pleuritic and likely associated with lupus- begin prednisone yesterday (20 mg 5 days)-No C/O chest pain today.  8. SLE: on prednisone 9. Volume: 5kg under dry and could go down some more, cont to lower 10. Hx SLE/ hypercoag state - starting on coumadin per Hematology  Plan - HD Sat, UF 2-3 kg, coumadin Rx  Kelly Splinter MD [place] pager [phone]    cell [phone] 02/15/2015, 12:38 PM    Subjective: Had some bleeding last night at HD, d/w patient that this was most likely mild or usual bleeding issues that occur with all HD pts from time to time.  She was reassured. New to using AVG.   Objective Filed Vitals:   02/14/15 1941 02/14/15 2023 02/14/15 2127 02/15/15 0529  BP: 102/71 106/61 95/48 128/65  Pulse: 110 95 103 106  Temp:  98 F (36.7 C)  98.3 F (36.8 C)  TempSrc:  Oral  Oral  Resp: 16 16  17   Weight: 83.1 kg (183 lb 3.2 oz) 82.8 kg (182 lb 8.7 oz)  82.5 kg (181 lb 14.1 oz)  SpO2:  99%  100%   Physical Exam General: Very pleasant female, NAD Heart: S1, S2. No M/G/R Lungs: Bilateral breath sounds CTA AP Abdomen: Abdomen soft, non-tender Extremities: LLE edema, particularly L foot.  Dialysis Access: L thigh AVG + thrill + bruit  Dialysis Orders: [facility] TTS 4h 87.5kg 400/800 2/2 Bath L thigh AVG (TDC removed) Heparin 2600 Aranesp 200/wk  Additional  Objective Labs: Basic Metabolic Panel:  Recent Labs Lab 02/09/15 1816 02/12/15 2329 02/14/15 1615  NA 139 136 138  K 3.9 3.9 3.9  CL 102 99* 102  CO2 30 29 31   GLUCOSE 64* 75 82  BUN 8 17 12   CREATININE 5.45* 6.68* 5.16*  CALCIUM 9.0 8.6* 9.0  PHOS 3.0 4.0 2.9   Liver Function Tests:  Recent Labs Lab 02/09/15 1816 02/12/15 2329 02/14/15 1615  ALBUMIN 1.8* 2.1* 2.1*   No results for input(s): LIPASE, AMYLASE in the last 168 hours. CBC:  Recent Labs Lab 02/09/15 1816 02/12/15 2329 02/14/15 1615  WBC 7.5 5.6 5.7  HGB 8.3* 8.2* 8.1*  HCT 27.7* 26.6* 28.0*  MCV 96.5 95.0 97.2  PLT 149* 193 185   Blood Culture    Component Value Date/Time   SDES BLOOD LEFT HAND 01/28/2015 1342   SPECREQUEST IN PEDIATRIC BOTTLE  3CC 01/28/2015 1342   CULT NO GROWTH 5 DAYS 01/28/2015 1342   REPTSTATUS 02/02/2015 FINAL 01/28/2015 1342    Cardiac Enzymes: No results for input(s): CKTOTAL, CKMB, CKMBINDEX, TROPONINI in the last 168 hours. CBG:  Recent Labs Lab 02/13/15 0404 02/13/15 0522 02/14/15 2021  GLUCAP 61* 87 71   Iron Studies: No results for input(s): IRON, TIBC, TRANSFERRIN, FERRITIN in the last 72 hours. @lablastinr3 @ Studies/Results: No results found. Medications:   . atorvastatin  20 mg Oral q1800  . citalopram  10 mg Oral Daily  . darbepoetin (ARANESP) injection - DIALYSIS  200  mcg Intravenous Q Thu-HD  . feeding supplement (PRO-STAT SUGAR FREE 64)  30 mL Oral BID  . heparin  2,600 Units Dialysis Once in dialysis  . hydroxychloroquine  400 mg Oral Daily  . metoprolol succinate  25 mg Oral QHS  . midodrine  10 mg Oral 2 times per day on Tue Thu Sat  . midodrine  10 mg Oral 2 times per day on Sun Mon Wed Fri  . multivitamin  1 tablet Oral QHS  . pantoprazole  40 mg Oral Daily  . sevelamer carbonate  1.6 g Oral TID WC  . warfarin  5 mg Oral ONCE-1800  . Warfarin - Pharmacist Dosing Inpatient   Does not apply [phone]

## 2015-02-16 ENCOUNTER — Ambulatory Visit (HOSPITAL_COMMUNITY): Payer: Medicaid Other | Admitting: Physical Therapy

## 2015-02-16 LAB — RENAL FUNCTION PANEL
Albumin: 2 g/dL — ABNORMAL LOW (ref 3.5–5.0)
Anion gap: 9 (ref 5–15)
BUN: 9 mg/dL (ref 6–20)
CO2: 28 mmol/L (ref 22–32)
Calcium: 8.9 mg/dL (ref 8.9–10.3)
Chloride: 100 mmol/L — ABNORMAL LOW (ref 101–111)
Creatinine, Ser: 5.24 mg/dL — ABNORMAL HIGH (ref 0.44–1.00)
GFR calc Af Amer: 11 mL/min — ABNORMAL LOW (ref >60)
GFR calc non Af Amer: 9 mL/min — ABNORMAL LOW (ref >60)
Glucose, Bld: 73 mg/dL (ref 65–99)
Phosphorus: 3 mg/dL (ref 2.5–4.6)
Potassium: 3.4 mmol/L — ABNORMAL LOW (ref 3.5–5.1)
Sodium: 137 mmol/L (ref 135–145)

## 2015-02-16 LAB — PROTIME-INR
INR: 1.62 — AB (ref 0.00–1.49)
Prothrombin Time: 19.3 seconds — ABNORMAL HIGH (ref 11.6–15.2)

## 2015-02-16 LAB — CBC
HCT: 32.3 % — ABNORMAL LOW (ref 36.0–46.0)
Hemoglobin: 9.5 g/dL — ABNORMAL LOW (ref 12.0–15.0)
MCH: 28.6 pg (ref 26.0–34.0)
MCHC: 29.4 g/dL — ABNORMAL LOW (ref 30.0–36.0)
MCV: 97.3 fL (ref 78.0–100.0)
Platelets: 192 10*3/uL (ref 150–400)
RBC: 3.32 MIL/uL — ABNORMAL LOW (ref 3.87–5.11)
RDW: 17.7 % — ABNORMAL HIGH (ref 11.5–15.5)
WBC: 5.8 10*3/uL (ref 4.0–10.5)

## 2015-02-16 MED ORDER — PENTAFLUOROPROP-TETRAFLUOROETH EX AERO: 1.0000 "application " | INHALATION_SPRAY | CUTANEOUS | Status: DC | PRN

## 2015-02-16 MED ORDER — SODIUM CHLORIDE 0.9 % IV SOLN: 100.0000 mL | INTRAVENOUS | Status: DC | PRN

## 2015-02-16 MED ORDER — ALTEPLASE 2 MG IJ SOLR
2.0000 mg | Freq: Once | INTRAMUSCULAR | Status: DC | PRN
  Filled 2015-02-16: qty 2

## 2015-02-16 MED ORDER — LIDOCAINE HCL (PF) 1 % IJ SOLN: 5.0000 mL | INTRAMUSCULAR | Status: DC | PRN

## 2015-02-16 MED ORDER — LIDOCAINE-PRILOCAINE 2.5-2.5 % EX CREA
1.0000 "application " | TOPICAL_CREAM | CUTANEOUS | Status: DC | PRN
  Filled 2015-02-16: qty 5

## 2015-02-16 MED ORDER — HEPARIN SODIUM (PORCINE) 1000 UNIT/ML DIALYSIS
1000.0000 [IU] | INTRAMUSCULAR | Status: DC | PRN
  Filled 2015-02-16: qty 1

## 2015-02-16 MED ORDER — WARFARIN SODIUM 5 MG PO TABS
5.0000 mg | ORAL_TABLET | Freq: Once | ORAL | Status: AC
  Administered 2015-02-16: 5 mg via ORAL
  Filled 2015-02-16: qty 1

## 2015-02-16 NOTE — Progress Notes (Signed)
ANTICOAGULATION CONSULT NOTE - Follow Up Consult  Pharmacy Consult for warfarin Indication: hypercoagulable state, hx of SLE  Allergies  Allergen Reactions  . Food Swelling    Red peppers    Patient Measurements: Weight: 182 lb 1.6 oz (82.6 kg)  Vital Signs: Temp: 98.7 F (37.1 C) (11/19 0451) Temp Source: Oral (11/19 0451) BP: 137/71 mmHg (11/19 0451) Pulse Rate: 89 (11/19 0451)  Labs:  Recent Labs  02/14/15 1615 02/15/15 0820 02/16/15 0433  HGB 8.1*  --   --   HCT 28.0*  --   --   PLT 185  --   --   LABPROT 16.1* 17.1* 19.3*  INR 1.28 1.38 1.62*  CREATININE 5.16*  --   --     Estimated Creatinine Clearance: 14.9 mL/min (by C-G formula based on Cr of 5.16).  Assessment:  39 y/o F w/ hypercoagulable state, hx of SLE and is currently subtherapeutic on warfarin. Has received warfarin 5 mg x 3 days. INR 1.27 > 1.28 > 1.38 > 1.62. Pt had recent bleeding from HD access on 10/17, primary team wants to dose warfarin cautiously. No new labs.   Goal of Therapy:  INR 2-3 Monitor platelets by anticoagulation protocol: Yes   Plan:  Warfarin po 5 mg x 1 Daily INR Monitor closely for bleed  Angela Burke, PharmD Pharmacy Resident Pager: [phone] 02/16/2015,7:45 AM

## 2015-02-16 NOTE — Progress Notes (Addendum)
Subjective/Complaints: Patient slept well overnight. She states she is more comfortable this morning.  ROS:  Denies CP, SOB, N/V/D  Objective: Vital Signs: Blood pressure 137/71, pulse 89, temperature 98.7 F (37.1 C), temperature source Oral, resp. rate 17, weight 82.6 kg (182 lb 1.6 oz), last menstrual period 01/21/2015, SpO2 100 %. No results found. Results for orders placed or performed during the hospital encounter of 02/04/15 (from the past 72 hour(s))  Protime-INR     Status: Abnormal   Collection Time: 02/13/15  2:25 PM  Result Value Ref Range   Prothrombin Time 16.0 (H) 11.6 - 15.2 seconds   INR 1.27 0.00 - 1.49  Protime-INR     Status: Abnormal   Collection Time: 02/14/15  4:15 PM  Result Value Ref Range   Prothrombin Time 16.1 (H) 11.6 - 15.2 seconds   INR 1.28 0.00 - 1.49  Renal function panel     Status: Abnormal   Collection Time: 02/14/15  4:15 PM  Result Value Ref Range   Sodium 138 135 - 145 mmol/L   Potassium 3.9 3.5 - 5.1 mmol/L   Chloride 102 101 - 111 mmol/L   CO2 31 22 - 32 mmol/L   Glucose, Bld 82 65 - 99 mg/dL   BUN 12 6 - 20 mg/dL   Creatinine, Ser 5.16 (H) 0.44 - 1.00 mg/dL   Calcium 9.0 8.9 - 10.3 mg/dL   Phosphorus 2.9 2.5 - 4.6 mg/dL   Albumin 2.1 (L) 3.5 - 5.0 g/dL   GFR calc non Af Amer 10 (L) >60 mL/min   GFR calc Af Amer 11 (L) >60 mL/min    Comment: (NOTE) The eGFR has been calculated using the CKD EPI equation. This calculation has not been validated in all clinical situations. eGFR's persistently <60 mL/min signify possible Chronic Kidney Disease.    Anion gap 5 5 - 15  CBC     Status: Abnormal   Collection Time: 02/14/15  4:15 PM  Result Value Ref Range   WBC 5.7 4.0 - 10.5 K/uL   RBC 2.88 (L) 3.87 - 5.11 MIL/uL   Hemoglobin 8.1 (L) 12.0 - 15.0 g/dL   HCT 28.0 (L) 36.0 - 46.0 %   MCV 97.2 78.0 - 100.0 fL   MCH 28.1 26.0 - 34.0 pg   MCHC 28.9 (L) 30.0 - 36.0 g/dL   RDW 18.0 (H) 11.5 - 15.5 %   Platelets 185 150 - 400 K/uL   Glucose, capillary     Status: None   Collection Time: 02/14/15  8:21 PM  Result Value Ref Range   Glucose-Capillary 71 65 - 99 mg/dL  Protime-INR     Status: Abnormal   Collection Time: 02/15/15  8:20 AM  Result Value Ref Range   Prothrombin Time 17.1 (H) 11.6 - 15.2 seconds   INR 1.38 0.00 - 1.49  Protime-INR     Status: Abnormal   Collection Time: 02/16/15  4:33 AM  Result Value Ref Range   Prothrombin Time 19.3 (H) 11.6 - 15.2 seconds   INR 1.62 (H) 0.00 - 1.49    Gen NAD. Vital signs reviewed. HEENT: Normocephalic, atraumatic Cardio: RRR. +Murmur Resp: CTA B/L and unlabored GI: BS positive and NT, ND Skin:   Wound Left thigh swollen aroung graft, dressing clean dry and intact Neuro: Alert/Oriented, Normal Sensory a Motor 4/5 Left delt, bi, tri, grip, 2+ L hip flex, knee xt, 3 ankle DF/PF Musc/Skel:  Swelling Left knee, no jt line tenderness, has L thigh  swelling as well.    Assessment/Plan: 1. Functional deficits secondary to right basal ganglia infarct with increased LLE weakness and debility from recent bacteremia which require 3+ hours per day of interdisciplinary therapy in a comprehensive inpatient rehab setting. Physiatrist is providing close team supervision and 24 hour management of active medical problems listed below. Physiatrist and rehab team continue to assess barriers to discharge/monitor patient progress toward functional and medical goals.  FIM: Function - Bathing Bathing activity did not occur: Refused Position: Wheelchair/chair at sink Body parts bathed by patient: Right arm, Left arm, Chest, Abdomen, Front perineal area, Buttocks Body parts bathed by helper: Back Bathing not applicable: Left lower leg, Right lower leg, Right upper leg, Left upper leg Assist Level:  (patient completed 6/8, 75%, FIM level 4, min assist)  Function- Upper Body Dressing/Undressing What is the patient wearing?: Hospital gown Pull over shirt/dress - Perfomed by patient:  Thread/unthread right sleeve, Thread/unthread left sleeve, Put head through opening, Pull shirt over trunk Assist Level: Set up Set up : To obtain clothing/put away Function - Lower Body Dressing/Undressing What is the patient wearing?: Hospital Gown Position: Sitting EOB Underwear - Performed by helper: Thread/unthread right underwear leg, Thread/unthread left underwear leg, Pull underwear up/down Pants- Performed by patient: Thread/unthread right pants leg, Thread/unthread left pants leg, Pull pants up/down Non-skid slipper socks- Performed by helper: Don/doff right sock, Don/doff left sock TED Hose - Performed by helper: Don/doff left TED hose Assist for footwear: Setup Assist for lower body dressing: Touching or steadying assistance (Pt > 75%)  Function - Toileting Toileting activity did not occur: N/A Toileting steps completed by patient: Adjust clothing prior to toileting, Performs perineal hygiene, Adjust clothing after toileting Toileting steps completed by helper: Adjust clothing prior to toileting, Performs perineal hygiene, Adjust clothing after toileting Assist level: Two helpers ([facility])  Function - Air cabin crew transfer activity did not occur: N/A Toilet transfer assistive device: Bedside commode Assist level to toilet: No Help, no cues, assistive device, takes more than a reasonable amount of time Assist level from toilet: No Help, no cues, assistive device, takes more than a reasonable amount of time Assist level to bedside commode (at bedside): Moderate assist (Pt 50 - 74%/lift or lower) Assist level from bedside commode (at bedside): Moderate assist (Pt 50 - 74%/lift or lower)  Function - Chair/bed transfer Chair/bed transfer method: Ambulatory Chair/bed transfer assist level: Supervision or verbal cues Chair/bed transfer assistive device: Armrests, Walker Chair/bed transfer details: Verbal cues for technique  Function - Locomotion: Wheelchair Will  patient use wheelchair at discharge?: Yes Type: Manual Max wheelchair distance: 50' Assist Level: Touching or steadying assistance (Pt > 75%) Wheel 50 feet with 2 turns activity did not occur: Safety/medical concerns Assist Level: Touching or steadying assistance (Pt > 75%) Wheel 150 feet activity did not occur: Safety/medical concerns Assist Level: Touching or steadying assistance (Pt > 75%) Turns around,maneuvers to table,bed, and toilet,negotiates 3% grade,maneuvers on rugs and over doorsills: No Function - Locomotion: Ambulation Assistive device: Walker-rolling Max distance: 36' Assist level: Supervision or verbal cues Assist level: Supervision or verbal cues Walk 50 feet with 2 turns activity did not occur: Safety/medical concerns Assist level: Supervision or verbal cues Walk 150 feet activity did not occur: Safety/medical concerns Walk 10 feet on uneven surfaces activity did not occur: Safety/medical concerns Assist level: Touching or steadying assistance (Pt > 75%)  Function - Comprehension Comprehension: Auditory Comprehension assist level: Understands complex 90% of the time/cues 10% of the time  Function - Expression Expression: Verbal Expression assist level: Expresses basic needs/ideas: With extra time/assistive device  Function - Social Interaction Social Interaction assist level: Interacts appropriately 90% of the time - Needs monitoring or encouragement for participation or interaction.  Function - Problem Solving Problem solving assist level: Solves basic 50 - 74% of the time/requires cueing 25 - 49% of the time  Function - Memory Memory assist level: Recognizes or recalls 25 - 49% of the time/requires cueing 50 - 75% of the time Patient normally able to recall (first 3 days only): Current season, Staff names and faces, That he or she is in a hospital, Location of own room  Medical Problem List and Plan: 1. Functional deficits secondary to right basal ganglia  infarct and debility from recent bacteremia.  Increased weakness noted in LLE ? Related to decondtioning vs new infarct 2.  DVT Prophylaxis/Anticoagulation: Chronic Coumadin resumed, per Dr Beryle Beams, start 23m and titrate up slowly, blood draws difficult, will do 3 times a week INR in HD I discussed this complex case with Dr. GEinar Gipfrom cardiology as well as Dr. GBeryle Beamsfrom hematology. Consensus is to do anticoagulation.Hematology recommends no loading dose for warfarin. Start at 5 mg and titrate to INR between 2 and 3   - INR 1.62 today  3. Pain Management: Oxycodone and Flexeril as needed.  4. ID/Pseudomonas sepsis. Completed Fortaz 02/09/2015,afeb 5. Neuropsych: This patient is capable of making decisions on her own behalf. 6. Skin/Wound Care: Routine skin checks 7. Fluids/Electrolytes/Nutrition: Routine I/Os with follow-up chemistries 8. End-stage renal disease/lupus nephritis. Continue hemodialysis as per renal services, uses new Left femoral graft for HD, Nephro to address pt's HD/TRansplant questions         9. Orthostatic hypotension. Midodrine 10 mg twice a day on dialysis days. 10. Chronic anemia. Continue Aranesp 11. Hyperlipidemia. Lipitor 12.  Mood and affect depression screen is positive,started low dose celexa, affect already improving 13.  Lupus pleuritis- resolved, off prednisone 14.  Knee pain , left improved (may be related to prednisone) 15.  Diarrhea post abx- occ no Cdiff LOS (Days) 12   A FACE TO FACE EVALUATION WAS PERFORMED  Siah Kannan ALorie Phenix11/19/2016, 8:57 AM

## 2015-02-16 NOTE — Progress Notes (Signed)
Physical Therapy Session Note  Patient Details  Name: [name] MRN: [id number] Date of Birth: [birth year]  Today's Date: 02/16/2015 PT Individual Time: 0900-0959 PT Individual Time Calculation (min): 59 min   Short Term Goals: Week 2:  PT Short Term Goal 1 (Week 2): = LTG due to estimated LOS  Skilled Therapeutic Interventions/Progress Updates:   Pt received in bed; pt reporting improvement from yesterday.  Reports LLE is less edematous and less painful today but stiff.  Pt reporting that her mother will not be coming in for education today since she will be coming Monday.  Began session with pt performing AROM with LLE: 12 reps each hip IR, hip ABD and heel slides to prepare LLE to transition OOB.  Pt performed supine > sit with bed rail and HOB elevated with supervision-min A to L side pushing up with LUE.  Performed sit > stand from bed and ambulated from bed > w/c 5' without UE support on AD with close supervision.  Pt transported to gym in w/c and discussed sequence for entering house from car including negotiating curb and ramp.  Had pt verbalize sequence for negotiating curb with rollator with mod questioning cues and ramp with rollator.  Also discussed that pt's mother will likely not be able to bump her up the curb in the w/c and that pt should wait in the car while her mother places the w/c at the top of the sidewalk for her to sit in once she has negotiated the curb.  Pt required min A to place rollator on top of curb; right before pt began to step pt became very anxious and reported increased HR.  Pt returned to sitting and vitals assessed.  Lead pt through deep breathing exercises to allow HR to return to baseline.  Rehab tech joined as +2 for supervision only.  Pt performed negotiation of one step with rollator with mod A of therapist and max verbal cues due to pt demonstrating decreased safety and sequencing when anxious.  Required min A to ambulate down ramp safely.  During seated  rest break demonstrated to pt how to bump empty w/c up/down curb.  Pt began to feel hot, dizzy and nauseous.  Pt transferred to w/c min A and back to room where she transferred w/c > bed with min A and mod A for sit > supine.  Vitals re-assessed and RN notified of elevated HR and nausea.  Once pt back in bed she became less anxious with a drop in BP and HR overall.  Pt left with all items within reach and RN present.  Will need to continue to practice with mother on Monday.  Therapy Documentation Precautions:  Precautions Precautions: Fall Precaution Comments: Pt's femoral cath is tunneled therefore she is able to get OOB; L knee joint pain Restrictions Weight Bearing Restrictions: No Vital Signs: Therapy Vitals Pulse Rate: 95 BP: 129/85 mmHg Patient Position (if appropriate): Lying Oxygen Therapy SpO2: 100 % O2 Device: Not Delivered Pain: Pain Assessment Pain Assessment: No/denies pain   See Function Navigator for Current Functional Status.   Therapy/Group: Individual Therapy  Raylene Everts [facility] 02/16/2015, 10:19 AM

## 2015-02-17 ENCOUNTER — Inpatient Hospital Stay (HOSPITAL_COMMUNITY): Payer: Medicaid Other | Admitting: Occupational Therapy

## 2015-02-17 LAB — PROTIME-INR
INR: 1.89 — ABNORMAL HIGH (ref 0.00–1.49)
Prothrombin Time: 21.6 seconds — ABNORMAL HIGH (ref 11.6–15.2)

## 2015-02-17 MED ORDER — WARFARIN SODIUM 5 MG PO TABS
5.0000 mg | ORAL_TABLET | Freq: Once | ORAL | Status: AC
  Administered 2015-02-17: 5 mg via ORAL
  Filled 2015-02-17: qty 1

## 2015-02-17 NOTE — Progress Notes (Signed)
ANTICOAGULATION CONSULT NOTE - Follow Up Consult  Pharmacy Consult for warfarin Indication: hypercoagulable state, hx of SLE  Allergies  Allergen Reactions  . Food Swelling    Red peppers    Patient Measurements: Weight: 178 lb 12.7 oz (81.1 kg)  Vital Signs: Temp: 98.4 F (36.9 C) (11/20 0611) Temp Source: Oral (11/20 0611) BP: 124/69 mmHg (11/20 0611) Pulse Rate: 99 (11/20 0611)  Labs:  Recent Labs  02/14/15 1615 02/15/15 0820 02/16/15 0433 02/16/15 1912 02/16/15 1913 02/17/15 0407  HGB 8.1*  --   --  9.5*  --   --   HCT 28.0*  --   --  32.3*  --   --   PLT 185  --   --  192  --   --   LABPROT 16.1* 17.1* 19.3*  --   --  21.6*  INR 1.28 1.38 1.62*  --   --  1.89*  CREATININE 5.16*  --   --   --  5.24*  --     Estimated Creatinine Clearance: 14.5 mL/min (by C-G formula based on Cr of 5.24).  Assessment:  39 y/o F w/ hypercoagulable state, hx of SLE and is currently subtherapeutic on warfarin. Has received warfarin 5 mg x 4 days. INR 1.27 > 1.28 > 1.38 > 1.62 > 1.89. Pt had recent bleeding from HD access on 10/17, primary team wants to dose warfarin cautiously. No new labs.   Goal of Therapy:  INR 2-3 Monitor platelets by anticoagulation protocol: Yes   Plan:  Warfarin po 5 mg x 1 Daily INR Monitor closely for bleed  Angela Burke, PharmD Pharmacy Resident Pager: [phone] 02/17/2015,8:42 AM

## 2015-02-17 NOTE — Progress Notes (Addendum)
Occupational Therapy Session Note  Patient Details  Name: [name] MRN: [id number] Date of Birth: 03-23-1976  Today's Date: 02/17/2015 OT Individual Time:  -   0945-1030  (45 min)      Short Term Goals: Week 1:  OT Short Term Goal 1 (Week 1): Pt will perform LB bathing sit to stand with min assist and AE.  OT Short Term Goal 1 - Progress (Week 1): Met OT Short Term Goal 2 (Week 1): Pt will perform LB dressing sit to stand with min assist and AE PRN. OT Short Term Goal 2 - Progress (Week 1): Met OT Short Term Goal 3 (Week 1): Pt will perform toilet transfers with min assist 3 consecutive sessions. OT Short Term Goal 3 - Progress (Week 1): Met OT Short Term Goal 4 (Week 1): Pt will perform walk-in shower transfer with min assist using the RW.  OT Short Term Goal 4 - Progress (Week 1): Discontinued (comment) (Pt has new dialysis port site and does not want to shower.) OT Short Term Goal 5 (Week 1): Pt will perform BUE light resistance exercise with supervision following written exercise plan.   OT Short Term Goal 5 - Progress (Week 1): Met Week 2:  OT Short Term Goal 1 (Week 2): STG = LTGs due to remaining LOS :     Skilled Therapeutic Interventions/Progress Updates:    Addressed bed mobility, sit to stand, functional mobility during bathing and dressing.  Pt.lying in bed.  Went from supine to sit with SBA and increased time.  Ambulated with Rollator walker to sink.  Bathed with LH sponge for LB and OT assisted with posterior peri for thoroughness.  Performed sit to stand 3 times and maintained with SBA .  Transferred back to bed at end of session and left in bed with all needs in reach.   Check back with pt later to go over shower transfer but she refused.   Therapy Documentation Precautions:  Precautions Precautions: Fall Precaution Comments: Pt's femoral cath is tunneled therefore she is able to get OOB; L knee joint pain Restrictions Weight Bearing Restrictions: No       Pain:  none reported             See Function Navigator for Current Functional Status.   Therapy/Group: Individual Therapy  Lisa Roca 02/17/2015, 9:59 AM

## 2015-02-17 NOTE — Progress Notes (Signed)
Subjective/Complaints: Patient seen and examined this morning lying in bed. She states she is feeling good. She notes she had a situation this morning that would normally cause her to get angry, but because she took her antianxiety medications he feels calm..  ROS:  Denies CP, SOB, N/V/D  Objective: Vital Signs: Blood pressure 124/69, pulse 99, temperature 98.4 F (36.9 C), temperature source Oral, resp. rate 18, weight 81.1 kg (178 lb 12.7 oz), last menstrual period 01/21/2015, SpO2 100 %. No results found. Results for orders placed or performed during the hospital encounter of 02/04/15 (from the past 72 hour(s))  Protime-INR     Status: Abnormal   Collection Time: 02/14/15  4:15 PM  Result Value Ref Range   Prothrombin Time 16.1 (H) 11.6 - 15.2 seconds   INR 1.28 0.00 - 1.49  Renal function panel     Status: Abnormal   Collection Time: 02/14/15  4:15 PM  Result Value Ref Range   Sodium 138 135 - 145 mmol/L   Potassium 3.9 3.5 - 5.1 mmol/L   Chloride 102 101 - 111 mmol/L   CO2 31 22 - 32 mmol/L   Glucose, Bld 82 65 - 99 mg/dL   BUN 12 6 - 20 mg/dL   Creatinine, Ser 5.16 (H) 0.44 - 1.00 mg/dL   Calcium 9.0 8.9 - 10.3 mg/dL   Phosphorus 2.9 2.5 - 4.6 mg/dL   Albumin 2.1 (L) 3.5 - 5.0 g/dL   GFR calc non Af Amer 10 (L) >60 mL/min   GFR calc Af Amer 11 (L) >60 mL/min    Comment: (NOTE) The eGFR has been calculated using the CKD EPI equation. This calculation has not been validated in all clinical situations. eGFR's persistently <60 mL/min signify possible Chronic Kidney Disease.    Anion gap 5 5 - 15  CBC     Status: Abnormal   Collection Time: 02/14/15  4:15 PM  Result Value Ref Range   WBC 5.7 4.0 - 10.5 K/uL   RBC 2.88 (L) 3.87 - 5.11 MIL/uL   Hemoglobin 8.1 (L) 12.0 - 15.0 g/dL   HCT 28.0 (L) 36.0 - 46.0 %   MCV 97.2 78.0 - 100.0 fL   MCH 28.1 26.0 - 34.0 pg   MCHC 28.9 (L) 30.0 - 36.0 g/dL   RDW 18.0 (H) 11.5 - 15.5 %   Platelets 185 150 - 400 K/uL  Glucose,  capillary     Status: None   Collection Time: 02/14/15  8:21 PM  Result Value Ref Range   Glucose-Capillary 71 65 - 99 mg/dL  Protime-INR     Status: Abnormal   Collection Time: 02/15/15  8:20 AM  Result Value Ref Range   Prothrombin Time 17.1 (H) 11.6 - 15.2 seconds   INR 1.38 0.00 - 1.49  Protime-INR     Status: Abnormal   Collection Time: 02/16/15  4:33 AM  Result Value Ref Range   Prothrombin Time 19.3 (H) 11.6 - 15.2 seconds   INR 1.62 (H) 0.00 - 1.49  CBC     Status: Abnormal   Collection Time: 02/16/15  7:12 PM  Result Value Ref Range   WBC 5.8 4.0 - 10.5 K/uL   RBC 3.32 (L) 3.87 - 5.11 MIL/uL   Hemoglobin 9.5 (L) 12.0 - 15.0 g/dL   HCT 32.3 (L) 36.0 - 46.0 %   MCV 97.3 78.0 - 100.0 fL   MCH 28.6 26.0 - 34.0 pg   MCHC 29.4 (L) 30.0 - 36.0 g/dL  RDW 17.7 (H) 11.5 - 15.5 %   Platelets 192 150 - 400 K/uL  Renal function panel     Status: Abnormal   Collection Time: 02/16/15  7:13 PM  Result Value Ref Range   Sodium 137 135 - 145 mmol/L   Potassium 3.4 (L) 3.5 - 5.1 mmol/L   Chloride 100 (L) 101 - 111 mmol/L   CO2 28 22 - 32 mmol/L   Glucose, Bld 73 65 - 99 mg/dL   BUN 9 6 - 20 mg/dL   Creatinine, Ser 5.24 (H) 0.44 - 1.00 mg/dL   Calcium 8.9 8.9 - 10.3 mg/dL   Phosphorus 3.0 2.5 - 4.6 mg/dL   Albumin 2.0 (L) 3.5 - 5.0 g/dL   GFR calc non Af Amer 9 (L) >60 mL/min   GFR calc Af Amer 11 (L) >60 mL/min    Comment: (NOTE) The eGFR has been calculated using the CKD EPI equation. This calculation has not been validated in all clinical situations. eGFR's persistently <60 mL/min signify possible Chronic Kidney Disease.    Anion gap 9 5 - 15  Protime-INR     Status: Abnormal   Collection Time: 02/17/15  4:07 AM  Result Value Ref Range   Prothrombin Time 21.6 (H) 11.6 - 15.2 seconds   INR 1.89 (H) 0.00 - 1.49    Gen NAD. Vital signs reviewed. HEENT: Normocephalic, atraumatic Cardio: RRR. +Murmur Resp: CTA B/L and unlabored GI: BS positive and NT, ND Skin:   Wound  Left thigh swollen aroung graft, dressing clean dry and intact Neuro: Alert/Oriented, Normal Sensory Motor 4/5 Left delt, bi, tri, grip, 2+ L hip flex, 3 ankle DF/PF Musc/Skel:  no tenderness, L thigh swelling   Assessment/Plan: 1. Functional deficits secondary to right basal ganglia infarct with increased LLE weakness and debility from recent bacteremia which require 3+ hours per day of interdisciplinary therapy in a comprehensive inpatient rehab setting. Physiatrist is providing close team supervision and 24 hour management of active medical problems listed below. Physiatrist and rehab team continue to assess barriers to discharge/monitor patient progress toward functional and medical goals.  FIM: Function - Bathing Bathing activity did not occur: Refused Position: Wheelchair/chair at sink Body parts bathed by patient: Right arm, Left arm, Chest, Abdomen, Front perineal area, Buttocks Body parts bathed by helper: Back Bathing not applicable: Left lower leg, Right lower leg, Right upper leg, Left upper leg Assist Level:  (patient completed 6/8, 75%, FIM level 4, min assist)  Function- Upper Body Dressing/Undressing What is the patient wearing?: Hospital gown Pull over shirt/dress - Perfomed by patient: Thread/unthread right sleeve, Thread/unthread left sleeve, Put head through opening, Pull shirt over trunk Assist Level: Set up Set up : To obtain clothing/put away Function - Lower Body Dressing/Undressing What is the patient wearing?: Hospital Gown Position: Sitting EOB Underwear - Performed by helper: Thread/unthread right underwear leg, Thread/unthread left underwear leg, Pull underwear up/down Pants- Performed by patient: Thread/unthread right pants leg, Thread/unthread left pants leg, Pull pants up/down Non-skid slipper socks- Performed by helper: Don/doff right sock, Don/doff left sock TED Hose - Performed by helper: Don/doff left TED hose Assist for footwear: Setup Assist for  lower body dressing: Touching or steadying assistance (Pt > 75%)  Function - Toileting Toileting activity did not occur: N/A Toileting steps completed by patient: Adjust clothing prior to toileting, Performs perineal hygiene, Adjust clothing after toileting Toileting steps completed by helper: Adjust clothing prior to toileting, Performs perineal hygiene, Adjust clothing after toileting Assist level: Two  helpers (At [facility])  Function - Air cabin crew transfer activity did not occur: N/A Toilet transfer assistive device: Bedside commode Assist level to toilet: No Help, no cues, assistive device, takes more than a reasonable amount of time Assist level from toilet: No Help, no cues, assistive device, takes more than a reasonable amount of time Assist level to bedside commode (at bedside): Moderate assist (Pt 50 - 74%/lift or lower) Assist level from bedside commode (at bedside): Moderate assist (Pt 50 - 74%/lift or lower)  Function - Chair/bed transfer Chair/bed transfer method: Ambulatory Chair/bed transfer assist level: Supervision or verbal cues Chair/bed transfer assistive device: Armrests Chair/bed transfer details: Verbal cues for technique  Function - Locomotion: Wheelchair Will patient use wheelchair at discharge?: Yes Type: Manual Max wheelchair distance: 100 Assist Level: Dependent (Pt equals 0%) Wheel 50 feet with 2 turns activity did not occur: Safety/medical concerns Assist Level: Dependent (Pt equals 0%) Wheel 150 feet activity did not occur: Safety/medical concerns Assist Level: Dependent (Pt equals 0%) Turns around,maneuvers to table,bed, and toilet,negotiates 3% grade,maneuvers on rugs and over doorsills: No Function - Locomotion: Ambulation Assistive device: Walker-rolling Max distance: 10 Assist level: Touching or steadying assistance (Pt > 75%) Assist level: Touching or steadying assistance (Pt > 75%) Walk 50 feet with 2 turns activity did not occur:  Safety/medical concerns Assist level: Supervision or verbal cues Walk 150 feet activity did not occur: Safety/medical concerns Walk 10 feet on uneven surfaces activity did not occur: Safety/medical concerns Assist level: Touching or steadying assistance (Pt > 75%)  Function - Comprehension Comprehension: Auditory Comprehension assist level: Understands complex 90% of the time/cues 10% of the time  Function - Expression Expression: Verbal Expression assist level: Expresses basic needs/ideas: With extra time/assistive device  Function - Social Interaction Social Interaction assist level: Interacts appropriately 90% of the time - Needs monitoring or encouragement for participation or interaction.  Function - Problem Solving Problem solving assist level: Solves basic 50 - 74% of the time/requires cueing 25 - 49% of the time  Function - Memory Memory assist level: Recognizes or recalls 25 - 49% of the time/requires cueing 50 - 75% of the time Patient normally able to recall (first 3 days only): Current season, Staff names and faces, That he or she is in a hospital, Location of own room  Medical Problem List and Plan: 1. Functional deficits secondary to right basal ganglia infarct and debility from recent bacteremia.  Increased weakness noted in LLE ? Related to decondtioning vs new infarct 2.  DVT Prophylaxis/Anticoagulation: Chronic Coumadin resumed, per Dr Beryle Beams, start 57m and titrate up slowly, blood draws difficult, will do 3 times a week INR in HD I discussed this complex case with Dr. GEinar Gipfrom cardiology as well as Dr. GBeryle Beamsfrom hematology. Consensus is to do anticoagulation.Hematology recommends no loading dose for warfarin. Start at 5 mg and titrate to INR between 2 and 3   - INR 1. 89 today  3. Pain Management: Oxycodone and Flexeril as needed.  4. ID/Pseudomonas sepsis. Completed Fortaz 02/09/2015,afeb 5. Neuropsych: This patient is capable of making decisions on  her own behalf. 6. Skin/Wound Care: Routine skin checks 7. Fluids/Electrolytes/Nutrition: Routine I/Os with follow-up chemistries 8. End-stage renal disease/lupus nephritis. Continue hemodialysis as per renal services, uses new Left femoral graft for HD, Nephro to address pt's HD/TRansplant questions         9. Orthostatic hypotension. Midodrine 10 mg twice a day on dialysis days. 10. Chronic anemia. Continue Aranesp  - Hb  9.5 on 11/19, improved from previous.  -Will continue to monitor both 11. Hyperlipidemia. Lipitor 12.  Mood and affect depression screen is positive,started low dose celexa, affect already improving 13.  Lupus pleuritis- resolved, off prednisone 14.  Knee pain , left improved (may be related to prednisone) 15.  Diarrhea post abx- occ no Cdiff LOS (Days) 13   A FACE TO FACE EVALUATION WAS PERFORMED  [name] Lorie Phenix 02/17/2015, 8:16 AM

## 2015-02-18 ENCOUNTER — Inpatient Hospital Stay (HOSPITAL_COMMUNITY): Payer: Medicaid Other | Admitting: Occupational Therapy

## 2015-02-18 ENCOUNTER — Inpatient Hospital Stay (HOSPITAL_COMMUNITY): Payer: Medicaid Other | Admitting: *Deleted

## 2015-02-18 ENCOUNTER — Inpatient Hospital Stay (HOSPITAL_COMMUNITY): Payer: Medicaid Other

## 2015-02-18 LAB — RENAL FUNCTION PANEL
Albumin: 1.9 g/dL — ABNORMAL LOW (ref 3.5–5.0)
Anion gap: 9 (ref 5–15)
BUN: 9 mg/dL (ref 6–20)
CALCIUM: 8.5 mg/dL — AB (ref 8.9–10.3)
CHLORIDE: 100 mmol/L — AB (ref 101–111)
CO2: 26 mmol/L (ref 22–32)
CREATININE: 5.06 mg/dL — AB (ref 0.44–1.00)
GFR, EST AFRICAN AMERICAN: 11 mL/min — AB (ref >60)
GFR, EST NON AFRICAN AMERICAN: 10 mL/min — AB (ref >60)
GLUCOSE: 93 mg/dL (ref 65–99)
Phosphorus: 2.9 mg/dL (ref 2.5–4.6)
Potassium: 3.2 mmol/L — ABNORMAL LOW (ref 3.5–5.1)
SODIUM: 135 mmol/L (ref 135–145)

## 2015-02-18 LAB — CBC
HCT: 30.5 % — ABNORMAL LOW (ref 36.0–46.0)
Hemoglobin: 9 g/dL — ABNORMAL LOW (ref 12.0–15.0)
MCH: 28.5 pg (ref 26.0–34.0)
MCHC: 29.5 g/dL — ABNORMAL LOW (ref 30.0–36.0)
MCV: 96.5 fL (ref 78.0–100.0)
PLATELETS: 140 10*3/uL — AB (ref 150–400)
RBC: 3.16 MIL/uL — AB (ref 3.87–5.11)
RDW: 17.3 % — ABNORMAL HIGH (ref 11.5–15.5)
WBC: 5.6 10*3/uL (ref 4.0–10.5)

## 2015-02-18 LAB — PROTIME-INR
INR: 2.28 — AB (ref 0.00–1.49)
PROTHROMBIN TIME: 24.9 s — AB (ref 11.6–15.2)

## 2015-02-18 MED ORDER — WARFARIN SODIUM 5 MG PO TABS
5.0000 mg | ORAL_TABLET | Freq: Every day | ORAL | Status: DC
  Administered 2015-02-18: 5 mg via ORAL
  Filled 2015-02-18: qty 1

## 2015-02-18 MED ORDER — ALTEPLASE 2 MG IJ SOLR: 2.0000 mg | Freq: Once | INTRAMUSCULAR | Status: DC | PRN

## 2015-02-18 MED ORDER — HEPARIN SODIUM (PORCINE) 1000 UNIT/ML DIALYSIS: 2600.0000 [IU] | Freq: Once | INTRAMUSCULAR | Status: DC

## 2015-02-18 MED ORDER — SODIUM CHLORIDE 0.9 % IV SOLN: 100.0000 mL | INTRAVENOUS | Status: DC | PRN

## 2015-02-18 MED ORDER — LIDOCAINE HCL (PF) 1 % IJ SOLN: 5.0000 mL | INTRAMUSCULAR | Status: DC | PRN

## 2015-02-18 MED ORDER — HEPARIN SODIUM (PORCINE) 1000 UNIT/ML DIALYSIS: 1000.0000 [IU] | INTRAMUSCULAR | Status: DC | PRN

## 2015-02-18 MED ORDER — MIDODRINE HCL 5 MG PO TABS
ORAL_TABLET | ORAL | Status: AC
  Filled 2015-02-18: qty 2

## 2015-02-18 MED ORDER — HEPARIN SODIUM (PORCINE) 1000 UNIT/ML DIALYSIS
20.0000 [IU]/kg | INTRAMUSCULAR | Status: DC | PRN
  Filled 2015-02-18: qty 2

## 2015-02-18 MED ORDER — PENTAFLUOROPROP-TETRAFLUOROETH EX AERO: 1.0000 "application " | INHALATION_SPRAY | CUTANEOUS | Status: DC | PRN

## 2015-02-18 MED ORDER — LIDOCAINE-PRILOCAINE 2.5-2.5 % EX CREA: 1.0000 "application " | TOPICAL_CREAM | CUTANEOUS | Status: DC | PRN

## 2015-02-18 NOTE — Plan of Care (Signed)
Problem: RH Tub/Shower Transfers Goal: LTG Patient will perform tub/shower transfers w/assist (OT) LTG: Patient will perform tub/shower transfers with assist, with/without cues using equipment (OT)  Outcome: Not Met (add Reason) Pt requires min A to complete tub bench transfer for assist to lift L LE over tub wall. Pt able to use shower stall at d/c. Practiced simulated shower stall transfer, pt able to complete supervision- steadying assist. Recommend shower stall transfer for home use.

## 2015-02-18 NOTE — Discharge Summary (Signed)
Discharge summary job [phone]

## 2015-02-18 NOTE — Plan of Care (Signed)
Problem: RH Ambulation Goal: LTG Patient will ambulate in community environment (PT) LTG: Patient will ambulate in community environment, # of feet with assistance (PT).  Outcome: Not Met (add Reason) Pt refused to participate in community mobility.  Problem: RH Wheelchair Mobility Goal: LTG Patient will propel w/c in community environment (PT) LTG: Patient will propel wheelchair in community environment, # of feet with assist (PT)  Outcome: Not Met (add Reason) Pt refused to participate in community mobility.

## 2015-02-18 NOTE — Discharge Instructions (Signed)
Inpatient Rehab Discharge Instructions  [name] Discharge date and time: No discharge date for patient encounter.   Activities/Precautions/ Functional Status: Activity: activity as tolerated Diet: renal diet Wound Care: none needed Functional status:  ___ No restrictions     ___ Walk up steps independently _x__ 24/7 supervision/assistance   ___ Walk up steps with assistance ___ Intermittent supervision/assistance  ___ Bathe/dress independently ___ Walk with walker     ___ Bathe/dress with assistance ___ Walk Independently    ___ Shower independently ___ Walk with assistance    ___ Shower with assistance ___ No alcohol     ___ Return to work/school ________  Special Instructions: Continue hemodialysis as directed per renal services   Weekly prothrombin times while on dialysis followed by Dr. Lin Landsman [phone] fax number [phone]   Follow-up with Family Medicine [facility] to resuming Coumadin therapy after recent completion of antibiotics   COMMUNITY REFERRALS UPON DISCHARGE:    Home Health:   PT, OT, RN   [place]   Date of last service:02/19/2015  Medical Equipment/Items Ordered:WHEELCHAIR  Agency/Supplier:[facility]    [phone]   GENERAL COMMUNITY RESOURCES FOR PATIENT/FAMILY: Support Groups:[facility] SUPPORT [facility]  My questions have been answered and I understand these instructions. I will adhere to these goals and the provided educational materials after my discharge from the hospital.  Patient/Caregiver Signature _______________________________ Date __________  Clinician Signature _______________________________________ Date __________  Please bring this form and your medication list with you to all your follow-up doctor's appointments.  STROKE/TIA DISCHARGE INSTRUCTIONS SMOKING Cigarette smoking nearly doubles your risk of having a stroke & is the single most alterable risk factor  If you smoke or  have smoked in the last 12 months, you are advised to quit smoking for your health.  Most of the excess cardiovascular risk related to smoking disappears within a year of stopping.  Ask you doctor about anti-smoking medications  [place] Quit Line: 1-800-QUIT NOW  Free Smoking Cessation Classes [phone]  CHOLESTEROL Know your levels; limit fat & cholesterol in your diet  Lipid Panel     Component Value Date/Time   CHOL 113 12/21/2014 1638   TRIG 107 12/21/2014 1638   HDL 53 12/21/2014 1638   CHOLHDL 2.1 12/21/2014 1638   VLDL 21 12/21/2014 1638   LDLCALC 39 12/21/2014 1638      Many patients benefit from treatment even if their cholesterol is at goal.  Goal: Total Cholesterol (CHOL) less than 160  Goal:  Triglycerides (TRIG) less than 150  Goal:  HDL greater than 40  Goal:  LDL (LDLCALC) less than 100   BLOOD PRESSURE American Stroke Association blood pressure target is less that 120/80 mm/Hg  Your discharge blood pressure is:  BP: 133/66 mmHg  Monitor your blood pressure  Limit your salt and alcohol intake  Many individuals will require more than one medication for high blood pressure  DIABETES (A1c is a blood sugar average for last 3 months) Goal HGBA1c is under 7% (HBGA1c is blood sugar average for last 3 months)  Diabetes: No known diagnosis of diabetes    Lab Results  Component Value Date   HGBA1C 6.1* 12/22/2014     Your HGBA1c can be lowered with medications, healthy diet, and exercise.  Check your blood sugar as directed by your physician  Call your physician if you experience unexplained or low blood sugars.  PHYSICAL ACTIVITY/REHABILITATION Goal is 30 minutes at least 4 days per week  Activity: Increase  activity slowly, Therapies: Physical Therapy: Home Health Return to work:   Activity decreases your risk of heart attack and stroke and makes your heart stronger.  It helps control your weight and blood pressure; helps you relax and can improve your  mood.  Participate in a regular exercise program.  Talk with your doctor about the best form of exercise for you (dancing, walking, swimming, cycling).  DIET/WEIGHT Goal is to maintain a healthy weight  Your discharge diet is: Diet regular Room service appropriate?: Yes; Fluid consistency:: Thin; Fluid restriction:: 1200 mL Fluid  liquids Your height is:    Your current weight is: Weight: 86.4 kg (190 lb 7.6 oz) Your Body Mass Index (BMI) is:     Following the type of diet specifically designed for you will help prevent another stroke.  Your goal weight range is:    Your goal Body Mass Index (BMI) is 19-24.  Healthy food habits can help reduce 3 risk factors for stroke:  High cholesterol, hypertension, and excess weight.  RESOURCES Stroke/Support Group:  Call [phone]   STROKE EDUCATION PROVIDED/REVIEWED AND GIVEN TO PATIENT Stroke warning signs and symptoms How to activate emergency medical system (call 911). Medications prescribed at discharge. Need for follow-up after discharge. Personal risk factors for stroke. Pneumonia vaccine given:  Flu vaccine given:  My questions have been answered, the writing is legible, and I understand these instructions.  I will adhere to these goals & educational materials that have been provided to me after my discharge from the hospital.

## 2015-02-18 NOTE — Progress Notes (Signed)
Social Work Patient ID: [name], female   DOB: [birth year], 39 y.o.   MRN: [id number] Met with pt, son and Mom to see how family education is going. Mom pleased with pt's progress and feels ready for her to come home. Have contacted [facility] to resume services and have ordered wheelchair for pt due to fatigue and inability to ambulate to HD. Pt pleased and feels ready to Go home tomorrow.

## 2015-02-18 NOTE — Plan of Care (Signed)
Problem: RH Bed Mobility Goal: LTG Patient will perform bed mobility with assist (PT) LTG: Patient will perform bed mobility with assistance, with/without cues (PT).  Outcome: Not Met (add Reason) Pt continues to require up to Min A for bed mobility, which her family will be able to provide.

## 2015-02-18 NOTE — Discharge Summary (Signed)
[name], [name] NO.:  [id number]  MEDICAL RECORD NO.:  [id number]  LOCATION:  4M05C                        FACILITY:  [facility]  PHYSICIAN:  Charlett Blake, M.D.DATE OF BIRTH:  11/20/1975  DATE OF ADMISSION:  02/04/2015 DATE OF DISCHARGE:  02/19/2015                              DISCHARGE SUMMARY   DISCHARGE DIAGNOSES: 1. Functional deficits secondary to right basal ganglia infarct from     recent bacteremia. 2. Chronic Coumadin for hypercoagulable state as well as history of     systemic lupus erythematous. 3. Pain management. 4. End-stage renal disease with hemodialysis. 5. Orthostatic hypotension. 6. Chronic anemia. 7. Hyperlipidemia. 8. Depression. 9. Lupus nephritis.  HISTORY OF PRESENT ILLNESS:  This is a 39 year old right-handed female with right MCA infarct had been on chronic Coumadin in the past for findings of valvular vegetation, hypertension, SVT, end-stage renal disease on hemodialysis from lupus, nephritis, and multiple hospitalizations.  Recent inpatient rehab services from January 07, 2015 to January 17, 2015 after right MCA infarct.  She was discharged to home, ambulating modified independent.  Lives with her mother and 2 children, ages 34 and 70, used a walker prior to admission.  Presented on January 25, 2015, with low-grade fever.  Broad-spectrum antibiotics were initiated.  Culture showed pan-sensitive Pseudomonas, catheter line bloodstream infection.  Hospital course bouts of hypotension.  Neurology consulted on January 25, 2015, for left leg weakness.  MRI of the brain showed medial expansion of anterior right MCA territory infarct and acute right basal ganglia infarct.  The patient had been on Coumadin and recently stopped.  Coumadin had been held with recent TEE showing no vegetation.  Question of resuming Coumadin after antibiotic therapy completed.  Acute on chronic anemia monitored.  She was maintained on intravenous  Fortaz through February 09, 2015.  Physical and occupational therapy ongoing.  The patient was admitted for a comprehensive rehab program.  PAST MEDICAL HISTORY:  See discharge diagnoses.  SOCIAL HISTORY:  Lives with mother and children ages 4 and 19, independent with  walker prior to admission.  FUNCTIONAL STATUS UPON ADMISSION TO REHAB SERVICES:  Minimal assist stand pivot transfers, +2 physical assist sit to supine, mod to max assist activities of daily living.  PHYSICAL EXAMINATION:  VITAL SIGNS:  Blood pressure 127/74, pulse 97, temperature 98, respirations 18. GENERAL:  This was an alert female, flat affect.  Oriented x3. LUNGS:  Clear to auscultation without wheeze. CARDIAC:  Regular rate and rhythm without murmur. ABDOMEN:  Soft, nontender.  Good bowel sounds. EXTREMITIES:  Strength is 4/5 throughout.  REHABILITATION HOSPITAL COURSE:  The patient was admitted to Inpatient Rehab Services with therapies initiated on a 3-hour daily basis consisting of physical therapy, occupational therapy, and rehabilitation nursing.  The following issues were addressed during the patient's rehabilitation stay.  Pertaining to Ms. Buccheri's right basal ganglia infarct remained stable, Coumadin therapy as advised, no bleeding episodes, she would follow up Neurology Services.  Chronic Coumadin had been resumed after discussing with Dr. Einar Gip as well as Dr. Beryle Beams for hypercoagulable state as well as history of SLE.  She would follow up with her primary MD and blood draw will be obtained while on  hemodialysis with an INR goal 2.00 to 3.00.  Pain management with the use of oxycodone, Flexeril, and good results.  Acute on chronic anemia remained stable.  She remained on hemodialysis as advised.  Close monitoring of blood pressure while on midodrine 10 mg twice daily as advised.  She had been placed on Celexa to help stabilize mood with emotional support provided.  The patient received weekly  collaborative interdisciplinary team conferences to discuss estimated length of stay, family teaching, any barriers to her discharge.  Supine to sit with bed rails, head of bed elevated with supervision minimal assist.  Performs sit-to-stand from bed, ambulated from bed to wheelchair without upper extremity support and using assistive device in close supervision. Required minimal assist to place rollator on top of curb.  She did need some rest breaks and discuss energy conservation.  Performed negotiation of one-step with assistive device with moderate assist.  Activities of daily living and homemaking went from supine to sit with standby assist, some increased time to complete task, ambulated with a rolling walker to the sink.  She needed some assistance for lower body dressing.  Overall strength and endurance continued to improve.  Full family teaching completed and plan discharge to home 02/19/2015. On the evening of 02/18/2015 after returning from dialysis noting increased confusion and altered mental status with auditory hallucinations as well as tachycardia and the 130s. Rectal temperature 100.4. Patient did receive Xanax 2 due to restlessness. Stat cranial CT scan showed no acute changes. EKG sinus tachycardia. There was no chest pain or shortness of breath oxygen saturation 100%. Due to his altered mental status changes tachycardia low-grade fever suspect possible sepsis. Internal medicine team was consulted plan to discharge to acute care services for ongoing monitoring and workup.  DISCHARGE MEDICATIONS: 1. Lipitor 20 mg p.o. daily. 2. Celexa 10 mg p.o. daily. 3. Flexeril 10 mg at bedtime as needed. 4. Plaquenil 400 mg p.o. daily. 5. Toprol-XL 25 mg p.o. at bedtime. 6. Midodrine 10 mg p.o. b.i.d. Tuesday, Thursday, Saturday. 7. Multivitamin daily. 8. Oxycodone 1 tablet every 6 hours as needed pain, dispense of 60     tablets. 9. Protonix 40 mg p.o. daily. 10.Renvela 1.6 g p.o.  t.i.d. 11.Coumadin 5 mg daily adjusted accordingly for INR of 2.00 to 3.00.  DIET:  Renal diet.  SPECIAL INSTRUCTIONS:  Obtain weekly prothrombin time while on dialysis, could be followed by Dr. Lin Landsman, [phone] fax number [phone].  The patient should follow up Dr. Alysia Penna at the outpatient rehab service office appointment to be made; Dr. Roney Jaffe, call for appointment; Dr. Lin Landsman, medical management; Dr. Adrian Prows, Cardiology Services as needed.     Lauraine Rinne, P.A.   ______________________________ Charlett Blake, M.D.    DA/MEDQ  D:  02/18/2015  T:  02/18/2015  Job:  [id number]  cc:   Annia Belt, M.D., F.A.C.P. Betti D. Ayesha Rumpf, M.D. Laverda Page, MD Sol Blazing, M.D.

## 2015-02-18 NOTE — Progress Notes (Signed)
Physical Therapy Session Note  Patient Details  Name: Devonna Goodemote MRN: [id number] Date of Birth: 04-11-1975  Today's Date: 02/18/2015 PT Individual Time: [id number] PT Individual Time Calculation (min): 39 min   Short Term Goals: Week 2:  PT Short Term Goal 1 (Week 2): = LTG due to estimated LOS  Skilled Therapeutic Interventions/Progress Updates:    Pt very anxious and self limiting during session coming up with various reasons of why she could not participate including needing to eat, getting medication, fatigue, awaiting HD, etc. Attempted to engage patient in [place] activities to complete d/c activities including community mobility and stair negotiation but despite multiple attempts and education, pt declines continuously. Pt performed bed mobility and sitting balance EOB while performing strength assessment (see d/c summary for details). Educated pt on goals and importance of continued mobility upon d/c while seated EOB and during rest breaks to complete bed mobility tasks. [facility] delivered pt's w/c but pt declined getting into w/c for therapist to check fit and make adjustments to legrest height at this time. Ultimately, repositioned patient in supine and set-up with food tray. Notified RN of limited session.    Therapy Documentation Precautions:  Precautions Precautions: Fall Precaution Comments: Pt's femoral cath is tunneled therefore she is able to get OOB; L knee joint pain Restrictions Weight Bearing Restrictions: No  Pain: C/o pain in LLE stating it has been bleeding though per RN this is not the case (PT looked at leg and did not see any blood either).   See Function Navigator for Current Functional Status.   Therapy/Group: Individual Therapy  Canary Brim Ivory Broad, PT, DPT  02/18/2015, 3:45 PM

## 2015-02-18 NOTE — Progress Notes (Signed)
Social Work Patient ID: [name], female   DOB: Mar 24, 1976, 39 y.o.   MRN: [id number] Contacted HD unit and pt placed on first run tomorrow.

## 2015-02-18 NOTE — Progress Notes (Signed)
Occupational Therapy Discharge Summary  Patient Details  Name: [name] MRN: [id number] Date of Birth: 09-14-1975  Patient has met 11 of 12 long term goals due to improved activity tolerance, improved balance, postural control and ability to compensate for deficits.  Patient to discharge at overall Supervision level with mobility and min A with self care.  Patient's care partner is independent to provide the necessary physical assistance at discharge.    Reasons goals not met: Pt requires min A to complete tub bench transfer for assist to lift L LE over tub wall. Pt able to use shower stall at d/c. Practiced simulated shower stall transfer, pt able to complete supervision- steadying assist. Recommend shower stall transfer for home use.   Recommendation:  Patient will benefit from ongoing skilled OT services in home health setting to continue to advance functional skills in the area of BADL, iADL and Reduce care partner burden.  Equipment: No equipment provided  Reasons for discharge: treatment goals met  Patient/family agrees with progress made and goals achieved: Yes  OT Discharge Precautions/Restrictions  Precautions Precautions: Fall Restrictions Weight Bearing Restrictions: No  ADL  Min A with LB self care, set up with UB self care   Vision/Perception  Vision- History Patient Visual Report: No change from baseline Vision- Assessment Eye Alignment: Within Functional Limits Perception Perception: Within Functional Limits Comments: WFL  Cognition Overall Cognitive Status: Within Functional Limits for tasks assessed Arousal/Alertness: Awake/alert Orientation Level: Oriented X4 Safety/Judgment: Impaired Comments: Pt is overly cautious, resulting in self-limiting movement and activity Sensation Sensation Light Touch: Appears Intact Stereognosis: Appears Intact Hot/Cold: Appears Intact Proprioception: Appears Intact Coordination Gross Motor Movements are Fluid and  Coordinated: Yes Fine Motor Movements are Fluid and Coordinated: Yes Heel Shin Test: decreased excursion bilaterally L>R, but functional Motor  Motor Motor: Hemiplegia;Abnormal postural alignment and control Motor - Skilled Clinical Observations: decreased strength in L-side Motor - Discharge Observations: kyphotic posture, rounded shoulders, decreased righting and protective responses Mobility  Bed Mobility Rolling Right: 5: Supervision Rolling Left: 5: Supervision Left Sidelying to Sit: 4: Min guard Supine to Sit: 4: Min assist Sit to Supine: 4: Min assist Sit to Supine - Details (indicate cue type and reason): Pt needing up to Min A for trunk and or LE and cues for efficiency  Transfers Sit to Stand: 5: Supervision Stand to Sit: 5: Supervision  Trunk/Postural Assessment  Cervical Assessment Cervical Assessment: Within Functional Limits Cervical Strength Overall Cervical Strength Comments: forward head posture Thoracic Assessment Thoracic Assessment: Within Functional Limits Lumbar Assessment Lumbar Assessment: Exceptions to [facility] Postural Control Postural Control: Deficits on evaluation Postural Limitations: posterior pelvic tilt  Balance Static Sitting Balance Static Sitting - Balance Support: Feet supported Static Sitting - Level of Assistance: 6: Modified independent (Device/Increase time) Dynamic Sitting Balance Dynamic Sitting - Balance Support: Feet supported Dynamic Sitting - Level of Assistance: 5: Stand by assistance Dynamic Sitting - Balance Activities: Reaching for objects;Reaching across midline Static Standing Balance Static Standing - Balance Support: Bilateral upper extremity supported;During functional activity Static Standing - Level of Assistance: 5: Stand by assistance Dynamic Standing Balance Dynamic Standing - Balance Support: During functional activity;Left upper extremity supported;Right upper extremity supported Dynamic Standing - Level of  Assistance: 5: Stand by assistance Dynamic Standing - Balance Activities: Reaching for objects;Reaching across midline Extremity/Trunk Assessment RUE Assessment RUE Assessment: Within Functional Limits LUE Assessment LUE Assessment: Within Functional Limits   See Function Navigator for Current Functional Status.  [place] 02/18/2015, 1:52 PM

## 2015-02-18 NOTE — Progress Notes (Signed)
ANTICOAGULATION CONSULT NOTE - Follow Up Consult  Pharmacy Consult for warfarin Indication: hypercoagulable state, hx of SLE  Allergies  Allergen Reactions  . Food Swelling    Red peppers    Patient Measurements: Weight: 182 lb 8.7 oz (82.8 kg)  Vital Signs: Temp: 97.5 F (36.4 C) (11/21 0454) Temp Source: Oral (11/21 0454) BP: 133/82 mmHg (11/21 0454) Pulse Rate: 95 (11/21 0454)  Labs:  Recent Labs  02/16/15 0433 02/16/15 1912 02/16/15 1913 02/17/15 0407 02/18/15 0614  HGB  --  9.5*  --   --   --   HCT  --  32.3*  --   --   --   PLT  --  192  --   --   --   LABPROT 19.3*  --   --  21.6* 24.9*  INR 1.62*  --   --  1.89* 2.28*  CREATININE  --   --  5.24*  --   --     Estimated Creatinine Clearance: 14.7 mL/min (by C-G formula based on Cr of 5.24).  Assessment:  39 y/o F w/ hypercoagulable state, hx of SLE on warfarin. INR therapeutic now, on 5mg  daily in the past 5 days. Plan for discharge tomorrow  Goal of Therapy:  INR 2-3 Monitor platelets by anticoagulation protocol: Yes   Plan:  Warfarin 5mg  po daily Daily INR Monitor closely for bleed  Maryanna Shape, PharmD, BCPS  Clinical Pharmacist  Pager: [phone]   02/18/2015,9:37 AM

## 2015-02-18 NOTE — Progress Notes (Signed)
Occupational Therapy Session Note  Patient Details  Name: [name] MRN: [id number] Date of Birth: Jan 13, 1976  Today's Date: 02/18/2015 OT Individual Time: 1330-1400 OT Individual Time Calculation (min): 30 min    Short Term Goals: Week 2:  OT Short Term Goal 1 (Week 2): STG = LTGs due to remaining LOS  Skilled Therapeutic Interventions/Progress Updates:    Pt seen for OT therapy session focusing on functional bathroom transfers. Pt in supine upon arrival, with family members present, agreeable to tx session. Family did not stay for OT session this afternoon.  In ADL apartment, pt completed simulated tub transfer bench transfer. She required min A to get L knee over tub wall. Increased time and cues required due to pt being very anxious with all mobility and tasks. She then completed simulated shower stall transfer to shower chair as she has both options for use at home. Steadying assist required for shower stall transfer as pt beginning to fatigue. She was able to recall technique for transfer taught in previous session. Recommend pt bathe using shower stall as she as able to more independently and safely complete shower stall transfer while using less energy- pt voiced understanding of recommendation. Pt returned to room at end of session, encouraged her to sit up in w/c until next therapy session in 45 minutes. Left with all needs in reach. Educated throughout session regarding energy conservation, deep breathing techniques, DME, bathing recommendations, and d/c planning.   Therapy Documentation Precautions:  Precautions Precautions: Fall Precaution Comments: Pt's femoral cath is tunneled therefore she is able to get OOB; L knee joint pain Restrictions Weight Bearing Restrictions: No Pain: Pain Assessment Pain Assessment: 0-10 Pain Score: 5  Pain Type: Acute pain Pain Location: Leg Pain Orientation: Left Pain Descriptors / Indicators: Aching Pain Onset: On-going Pain  Intervention(s): Rest;Repositioned  See Function Navigator for Current Functional Status.   Therapy/Group: Individual Therapy  Lewis, Wesam Gearhart C 02/18/2015, 7:20 AM

## 2015-02-18 NOTE — Progress Notes (Signed)
Patient ID: [name], female   DOB: [birth year], 39 y.o.   MRN: [id number]  [facility] Progress Note    Subjective:   Feels better today   Objective:   BP 133/82 mmHg  Pulse 95  Temp(Src) 97.5 F (36.4 C) (Oral)  Resp 18  Wt 82.8 kg (182 lb 8.7 oz)  SpO2 100%  LMP 01/21/2015  Intake/Output: I/O last 3 completed shifts: In: 745 [P.O.:600; I.V.:145] Out: 800 [Other:800]   Intake/Output this shift:    Weight change: -0.1 kg (-3.5 oz)  Physical Exam: Gen:WD [name] in NAD CVS:no rub Resp:cta [id number] Ext: L thigh AVG +T/B  Labs: BMET  Recent Labs Lab 02/12/15 2329 02/14/15 1615 02/16/15 1913  NA 136 138 137  K 3.9 3.9 3.4*  CL 99* 102 100*  CO2 29 31 28   GLUCOSE 75 82 73  BUN 17 12 9   CREATININE 6.68* 5.16* 5.24*  ALBUMIN 2.1* 2.1* 2.0*  CALCIUM 8.6* 9.0 8.9  PHOS 4.0 2.9 3.0   CBC  Recent Labs Lab 02/12/15 2329 02/14/15 1615 02/16/15 1912  WBC 5.6 5.7 5.8  HGB 8.2* 8.1* 9.5*  HCT 26.6* 28.0* 32.3*  MCV 95.0 97.2 97.3  PLT 193 185 192    @IMGRELPRIORS @ Medications:    . atorvastatin  20 mg Oral q1800  . citalopram  10 mg Oral Daily  . darbepoetin (ARANESP) injection - DIALYSIS  200 mcg Intravenous Q Thu-HD  . feeding supplement (PRO-STAT SUGAR FREE 64)  30 mL Oral BID  . heparin  2,600 Units Dialysis Once in dialysis  . hydroxychloroquine  400 mg Oral Daily  . metoprolol succinate  25 mg Oral QHS  . midodrine  10 mg Oral 2 times per day on Tue Thu Sat  . midodrine  10 mg Oral 2 times per day on Sun Mon Wed Fri  . multivitamin  1 tablet Oral QHS  . pantoprazole  40 mg Oral Daily  . sevelamer carbonate  1.6 g Oral TID WC  . warfarin  5 mg Oral q1800  . Warfarin - Pharmacist Dosing Inpatient   Does not apply [id number]    Assessment/ Plan:   1. Pseudomonas catheter-related sepsis- completed fortaz 02/09/15, cath removed 2. ESRD cont with TTS  3. 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 Nutrition: per  primary 6. Hypotension: chronic, on midodrine 10mg  tid on HD days 7. Hypercoagulable state due to SLE- now on coumadin per hematology 8. Vascular access- using left thigh AVG 9. SLE- on prednisone 10. Dispo- for discharge to home tomorrow after HD then will f/u at [facility] on Friday for next HD session due to holiday schedule.    [name] 02/18/2015, 10:25 AM

## 2015-02-18 NOTE — Progress Notes (Signed)
Subjective/Complaints: No issues overnite   ROS:  Denies CP, SOB, N/V/D  Objective: Vital Signs: Blood pressure 133/82, pulse 95, temperature 97.5 F (36.4 C), temperature source Oral, resp. rate 18, weight 82.8 kg (182 lb 8.7 oz), last menstrual period 01/21/2015, SpO2 100 %. No results found. Results for orders placed or performed during the hospital encounter of 02/04/15 (from the past 72 hour(s))  Protime-INR     Status: Abnormal   Collection Time: 02/15/15  8:20 AM  Result Value Ref Range   Prothrombin Time 17.1 (H) 11.6 - 15.2 seconds   INR 1.38 0.00 - 1.49  Protime-INR     Status: Abnormal   Collection Time: 02/16/15  4:33 AM  Result Value Ref Range   Prothrombin Time 19.3 (H) 11.6 - 15.2 seconds   INR 1.62 (H) 0.00 - 1.49  CBC     Status: Abnormal   Collection Time: 02/16/15  7:12 PM  Result Value Ref Range   WBC 5.8 4.0 - 10.5 K/uL   RBC 3.32 (L) 3.87 - 5.11 MIL/uL   Hemoglobin 9.5 (L) 12.0 - 15.0 g/dL   HCT 32.3 (L) 36.0 - 46.0 %   MCV 97.3 78.0 - 100.0 fL   MCH 28.6 26.0 - 34.0 pg   MCHC 29.4 (L) 30.0 - 36.0 g/dL   RDW 17.7 (H) 11.5 - 15.5 %   Platelets 192 150 - 400 K/uL  Renal function panel     Status: Abnormal   Collection Time: 02/16/15  7:13 PM  Result Value Ref Range   Sodium 137 135 - 145 mmol/L   Potassium 3.4 (L) 3.5 - 5.1 mmol/L   Chloride 100 (L) 101 - 111 mmol/L   CO2 28 22 - 32 mmol/L   Glucose, Bld 73 65 - 99 mg/dL   BUN 9 6 - 20 mg/dL   Creatinine, Ser 5.24 (H) 0.44 - 1.00 mg/dL   Calcium 8.9 8.9 - 10.3 mg/dL   Phosphorus 3.0 2.5 - 4.6 mg/dL   Albumin 2.0 (L) 3.5 - 5.0 g/dL   GFR calc non Af Amer 9 (L) >60 mL/min   GFR calc Af Amer 11 (L) >60 mL/min    Comment: (NOTE) The eGFR has been calculated using the CKD EPI equation. This calculation has not been validated in all clinical situations. eGFR's persistently <60 mL/min signify possible Chronic Kidney Disease.    Anion gap 9 5 - 15  Protime-INR     Status: Abnormal   Collection  Time: 02/17/15  4:07 AM  Result Value Ref Range   Prothrombin Time 21.6 (H) 11.6 - 15.2 seconds   INR 1.89 (H) 0.00 - 1.49    Gen NAD. Vital signs reviewed. HEENT: Normocephalic, atraumatic Cardio: RRR. +Murmur Resp: CTA B/L and unlabored GI: BS positive and NT, ND Skin:   Wound Left thigh swollen aroung graft, dressing clean dry and intact Neuro: Alert/Oriented, Normal Sensory Motor 4/5 Left delt, bi, tri, grip, 2+ L hip flex, 3 ankle DF/PF Musc/Skel:  no tenderness, L thigh swelling   Assessment/Plan: 1. Functional deficits secondary to right basal ganglia infarct with increased LLE weakness and debility from recent bacteremia which require 3+ hours per day of interdisciplinary therapy in a comprehensive inpatient rehab setting. Physiatrist is providing close team supervision and 24 hour management of active medical problems listed below. Physiatrist and rehab team continue to assess barriers to discharge/monitor patient progress toward functional and medical goals. Anticipate D/C in am FIM: Function - Bathing Bathing activity did not  occur: Refused Position: Wheelchair/chair at sink Body parts bathed by patient: Right arm, Left arm, Chest, Abdomen, Front perineal area, Buttocks, Right upper leg, Left upper leg, Right lower leg, Left lower leg Body parts bathed by helper: Back, Buttocks Bathing not applicable: Left lower leg, Right lower leg, Right upper leg, Left upper leg Assist Level: Set up Set up : To obtain items  Function- Upper Body Dressing/Undressing What is the patient wearing?: [place] over shirt/dress - Perfomed by patient: Thread/unthread right sleeve, Thread/unthread left sleeve, Put head through opening, Pull shirt over trunk Assist Level: Set up Set up : To obtain clothing/put away Function - Lower Body Dressing/Undressing What is the patient wearing?: Hospital Gown Position: Wheelchair/chair at [place] - Performed by helper: Thread/unthread  right underwear leg, Thread/unthread left underwear leg, Pull underwear up/down Pants- Performed by patient: Thread/unthread right pants leg, Thread/unthread left pants leg, Pull pants up/down Non-skid slipper socks- Performed by helper: Don/doff right sock, Don/doff left sock TED Hose - Performed by helper: Don/doff left TED hose Assist for footwear: Setup Assist for lower body dressing: Touching or steadying assistance (Pt > 75%)  Function - Toileting Toileting activity did not occur: N/A Toileting steps completed by patient: Adjust clothing prior to toileting, Performs perineal hygiene, Adjust clothing after toileting Toileting steps completed by helper: Adjust clothing prior to toileting, Performs perineal hygiene, Adjust clothing after toileting Assist level: Two helpers (At [facility])  Function - Air cabin crew transfer activity did not occur: N/A Toilet transfer assistive device: Bedside commode Assist level to toilet: No Help, no cues, assistive device, takes more than a reasonable amount of time Assist level from toilet: No Help, no cues, assistive device, takes more than a reasonable amount of time Assist level to bedside commode (at bedside): Moderate assist (Pt 50 - 74%/lift or lower) Assist level from bedside commode (at bedside): Moderate assist (Pt 50 - 74%/lift or lower)  Function - Chair/bed transfer Chair/bed transfer method: Ambulatory Chair/bed transfer assist level: Supervision or verbal cues Chair/bed transfer assistive device: Armrests Chair/bed transfer details: Verbal cues for technique  Function - Locomotion: Wheelchair Will patient use wheelchair at discharge?: Yes Type: Manual Max wheelchair distance: 100 Assist Level: Dependent (Pt equals 0%) Wheel 50 feet with 2 turns activity did not occur: Safety/medical concerns Assist Level: Dependent (Pt equals 0%) Wheel 150 feet activity did not occur: Safety/medical concerns Assist Level: Dependent (Pt equals  0%) Turns around,maneuvers to table,bed, and toilet,negotiates 3% grade,maneuvers on rugs and over doorsills: No Function - Locomotion: Ambulation Assistive device: Walker-rolling Max distance: 10 Assist level: Touching or steadying assistance (Pt > 75%) Assist level: Touching or steadying assistance (Pt > 75%) Walk 50 feet with 2 turns activity did not occur: Safety/medical concerns Assist level: Supervision or verbal cues Walk 150 feet activity did not occur: Safety/medical concerns Walk 10 feet on uneven surfaces activity did not occur: Safety/medical concerns Assist level: Touching or steadying assistance (Pt > 75%)  Function - Comprehension Comprehension: Auditory Comprehension assist level: Understands complex 90% of the time/cues 10% of the time  Function - Expression Expression: Verbal Expression assist level: Expresses basic needs/ideas: With extra time/assistive device  Function - Social Interaction Social Interaction assist level: Interacts appropriately 90% of the time - Needs monitoring or encouragement for participation or interaction.  Function - Problem Solving Problem solving assist level: Solves basic 50 - 74% of the time/requires cueing 25 - 49% of the time  Function - Memory Memory assist level: Recognizes or recalls 25 -  49% of the time/requires cueing 50 - 75% of the time Patient normally able to recall (first 3 days only): Current season, Staff names and faces, That he or she is in a hospital, Location of own room  Medical Problem List and Plan: 1. Functional deficits secondary to right basal ganglia infarct and debility from recent bacteremia.  Increased weakness noted in LLE ? Related to decondtioning vs new infarct 2.  DVT Prophylaxis/Anticoagulation: Chronic Coumadin resumed, per Dr Beryle Beams, start 101m and titrate up slowly, blood draws difficult, will do 3 times a week INR in HD I discussed this complex case with Dr. GEinar Gipfrom cardiology as well as  Dr. GBeryle Beamsfrom hematology. Consensus is to do anticoagulation.Hematology recommends no loading dose for warfarin. Start at 5 mg and titrate to INR between 2 and 3   - INR 1. 89 yesterday  3. Pain Management: Oxycodone and Flexeril as needed.  4. ID/Pseudomonas sepsis. Completed Fortaz 02/09/2015,afeb 5. Neuropsych: This patient is capable of making decisions on her own behalf. 6. Skin/Wound Care: Routine skin checks 7. Fluids/Electrolytes/Nutrition: Routine I/Os with follow-up chemistries 8. End-stage renal disease/lupus nephritis. Continue hemodialysis as per renal services, uses new Left femoral graft for HD, Nephro to address pt's HD/TRansplant questions         9. Orthostatic hypotension. Midodrine 10 mg twice a day on dialysis days. 10. Chronic anemia. Continue Aranesp  - Hb 9.5 on 11/19, improved from previous.   11. Hyperlipidemia. Lipitor 12.  Mood and affect depression screen is positive,started low dose celexa, affect already improving 13.  Lupus pleuritis- resolved, off prednisone 14.  Knee pain , left improved (may be related to prednisone) 15.  Diarrhea post abx- occ no Cdiff LOS (Days) 14   A FACE TO FACE EVALUATION WAS PERFORMED  KIRSTEINS,ANDREW E 02/18/2015, 6:49 AM

## 2015-02-18 NOTE — Progress Notes (Signed)
Physical Therapy Session Note  Patient Details  Name: [name] MRN: [id number] Date of Birth: 12-08-1975  Today's Date: 02/18/2015 PT Individual Time: 0930-1040 PT Individual Time Calculation (min): 70 min   Short Term Goals: Week 2:  PT Short Term Goal 1 (Week 2): = LTG due to estimated LOS      Skilled Therapeutic Interventions/Progress Updates:  Tx focused on family training and functional mobility. Family was 42min late for 60 min tx, but we were able to cover all needed education items and hands-on training regarding mobility and activity. The primary points included safety and encouraging the pt to do as much as she is able herself before asking her family for assistance in order to continue increasing her strength and independence. Pt demonstrated increased anxiety and difficulty processing once family arrived, but this was mitigated with some encouragement of independence.   Training included the following mobiliy scenarios including hands-on demonstration from son and verbal understanding from her mother:  Supine<>sit from regular bed in apartment x2 with up to [place] A for LLE and max cues/encouragement Stand-pivot transfers with S from [facility] and furniture x4 throughout Gait x50' and 2x12' with 4WW and close S with cues for posture over carpeted and tile surfaces Car transfer with S. Education and training family on how to fold up [place] and parts management. Suggested to family that WC be set up on curb prior to pt sitting in it.  Son propelled WC up/down ramp with cues for slowing descent Pt propelled WC x75' with S and efficiency cues for steering in controlled and apartment settings.   They have no further questions at this time and feel pleased with progress, prepared for DC.      Therapy Documentation Precautions:  Precautions Precautions: Fall Precaution Comments: Pt's femoral cath is tunneled therefore she is able to get OOB; L knee joint pain Restrictions Weight  Bearing Restrictions: No General:   Vital Signs:   Pain: none   See Function Navigator for Current Functional Status.   Therapy/Group: Individual Therapy  Kennieth Rad, PT, DPT   02/18/2015, 10:52 AM

## 2015-02-18 NOTE — Progress Notes (Signed)
Occupational Therapy Session Note  Patient Details  Name: [name] MRN: [id number] Date of Birth: Eileen 28, 1977  Today's Date: 02/18/2015 OT Individual Time: 1100-1200 OT Individual Time Calculation (min): 60 min    Short Term Goals: Week 1:  OT Short Term Goal 1 (Week 1): Pt will perform LB bathing sit to stand with min assist and AE.  OT Short Term Goal 1 - Progress (Week 1): Met OT Short Term Goal 2 (Week 1): Pt will perform LB dressing sit to stand with min assist and AE PRN. OT Short Term Goal 2 - Progress (Week 1): Met OT Short Term Goal 3 (Week 1): Pt will perform toilet transfers with min assist 3 consecutive sessions. OT Short Term Goal 3 - Progress (Week 1): Met OT Short Term Goal 4 (Week 1): Pt will perform walk-in shower transfer with min assist using the RW.  OT Short Term Goal 4 - Progress (Week 1): Discontinued (comment) (Pt has new dialysis port site and does not want to shower.) OT Short Term Goal 5 (Week 1): Pt will perform BUE light resistance exercise with supervision following written exercise plan.   OT Short Term Goal 5 - Progress (Week 1): Met Week 2:  OT Short Term Goal 1 (Week 2): STG = LTGs due to remaining LOS     Skilled Therapeutic Interventions/Progress Updates:    Pt seen by skilled OT for family education with mother and son.  Pt very distracted and anxious at start of session, stating she felt her heart rate was high. Pt allowed to rest while therapist obtained AE. Pt needed max encouragement to practice with AE to demonstrate to her family how to use equipment.  She eventually agreed to don pants and socks with reacher and sock aid with min A for cues and set up and guiding assist with reacher. She suddenly felt that she was having a bowel movement and did not want to get up. Max encouragement to walk to toilet. Pt only had gas. She was able to transfer and toilet with S.  She then stated she was beyond her limits and needed to get into bed.  Pt  adjusted in bed with only slight A.  Reviewed with family how to facilitate her strength by allowing her to do as much for herself and only assisting when needed.  Reviewed with pt her home exercise program and deep breathing strategies to reduce stress.   Pt in bed with all needs met.  Therapy Documentation Precautions:  Precautions Precautions: Fall Precaution Comments: Pt's femoral cath is tunneled therefore she is able to get OOB; L knee joint pain Restrictions Weight Bearing Restrictions: No      Pain: Pain Assessment Pain Assessment: 0-10 Pain Score: 5  Pain Type: Acute pain Pain Location: Leg Pain Orientation: Left Pain Descriptors / Indicators: Aching Pain Onset: On-going Pain Intervention(s): Rest;Repositioned ADL:  See Function Navigator for Current Functional Status.   Therapy/Group: Individual Therapy  SAGUIER,JULIA 02/18/2015, 1:42 PM

## 2015-02-18 NOTE — Progress Notes (Signed)
Physical Therapy Discharge Summary  Patient Details  Name: Yulonda Wheeling MRN: [id number] Date of Birth: December 05, 1975  Patient has met 10 of 13 long term goals due to improved activity tolerance, improved balance, improved postural control, increased strength, increased range of motion, decreased pain and ability to compensate for deficits.  Patient to discharge at a household ambulatory level with rollator at supervision level. W/c used for longer distances. Pt is limited by fatigue, pain, and overall places limitations upon herself. Patient's mom and son is independent to provide the necessary physical and cognitive assistance at discharge.  Reasons goals not met: Pt requires up to min assist for bed mobility which family is able to provide, so she did not meet goal for supervision level. Pt did not meet community w/c or gait goal due to refusing to participate.  Recommendation:  Patient will benefit from ongoing skilled PT services in home health setting to continue to advance safe functional mobility, address ongoing impairments in strength, activity tolerance, motor control, postural control, and minimize fall risk.  Equipment: 20"x18" WC with cushion. Pt has 4WW already.  Reasons for discharge: treatment goals met and discharge from hospital  Patient/family agrees with progress made and goals achieved: Yes  PT Discharge Precautions/Restrictions Precautions Precautions: Fall Restrictions Weight Bearing Restrictions: No Vision/Perception  Vision - Assessment Eye Alignment: Within Functional Limits Perception Perception: Within Functional Limits  Cognition Overall Cognitive Status: Within Functional Limits for tasks assessed Arousal/Alertness: Awake/alert Orientation Level: Oriented X4 Safety/Judgment: Impaired Comments: Pt is overly cautious, resulting in self-limiting movement and activity Sensation Sensation Light Touch: Appears Intact Coordination Gross Motor Movements  are Fluid and Coordinated: Yes Fine Motor Movements are Fluid and Coordinated: Yes Heel Shin Test: decreased excursion bilaterally L>R, but functional Motor  Motor Motor: Hemiplegia;Abnormal postural alignment and control Motor - Skilled Clinical Observations: decreased strength in L-side Motor - Discharge Observations: kyphotic posture, rounded shoulders, decreased righting and protective responses  Mobility Bed Mobility Rolling Right: 5: Supervision Rolling Left: 5: Supervision Left Sidelying to Sit: 4: Min guard Supine to Sit: 4: Min assist Sit to Supine: 4: Min assist Sit to Supine - Details (indicate cue type and reason): Pt needing up to Min A for trunk and or LE and cues for efficiency  Transfers Transfers: Yes Sit to Stand: 5: Supervision Stand to Sit: 5: Supervision Stand Pivot Transfers: 5: Supervision Stand Pivot Transfer Details (indicate cue type and reason): Cues for hand placement and anterior translation Locomotion  Ambulation Ambulation: Yes Ambulation/Gait Assistance: 5: Supervision Ambulation Distance (Feet): 50 Feet Assistive device: Other (Comment) Agricultural consultant) Ambulation/Gait Assistance Details: Postural cues and encouragement to continue Wheelchair Mobility Wheelchair Mobility: Yes Wheelchair Assistance: 5: Personnel officer Assistance Details: Verbal cues for Marketing executive: Both upper extremities Wheelchair Parts Management: Needs assistance Distance: 75  Trunk/Postural Assessment  Cervical Assessment Cervical Assessment: Within Functional Limits Cervical Strength Overall Cervical Strength Comments: forward head posture Thoracic Assessment Thoracic Assessment: Within Functional Limits Lumbar Assessment Lumbar Assessment: Exceptions to [facility] Postural Control Postural Control: Deficits on evaluation Postural Limitations: posterior pelvic tilt  Balance Static Sitting Balance Static Sitting - Balance Support: Feet  supported Static Sitting - Level of Assistance: 6: Modified independent (Device/Increase time) Dynamic Sitting Balance Dynamic Sitting - Balance Support: Feet supported Dynamic Sitting - Level of Assistance: 5: Stand by assistance Dynamic Sitting - Balance Activities: Reaching for objects;Reaching across midline Static Standing Balance Static Standing - Balance Support: Bilateral upper extremity supported;During functional activity Static Standing - Level of Assistance: 5: Stand by  assistance Dynamic Standing Balance Dynamic Standing - Balance Support: During functional activity;Left upper extremity supported;Right upper extremity supported Dynamic Standing - Level of Assistance: 5: Stand by assistance Dynamic Standing - Balance Activities: Reaching for objects;Reaching across midline Extremity Assessment  RUE Assessment RUE Assessment:  (~3+/5 throughout sholder and elbow) LUE Assessment LUE Assessment: Within Functional Limits RLE Assessment RLE Assessment: Exceptions to [facility] RLE Strength RLE Overall Strength: Deficits RLE Overall Strength Comments: grossly 3+/5 to 4-/5 seated LLE Assessment LLE Assessment: Exceptions to [facility] LLE Strength LLE Overall Strength: Deficits LLE Overall Strength Comments: 3+/5 ankle; 3/5 knee and 3-/5 hip   See Function Navigator for Current Functional Status.    Kennieth Rad, PT, DPT Lars Masson, PT, DPT   02/18/2015, 3:58 PM

## 2015-02-19 ENCOUNTER — Inpatient Hospital Stay (HOSPITAL_COMMUNITY)
Admission: AD | Admit: 2015-02-19 | Discharge: 2015-03-22 | DRG: 871 | Disposition: A | Discharge status: Skilled Nursing Facility | Payer: Medicaid Other | Source: Ambulatory Visit | Attending: Student in an Organized Health Care Education/Training Program | Admitting: Student in an Organized Health Care Education/Training Program

## 2015-02-19 ENCOUNTER — Other Ambulatory Visit: Payer: Self-pay

## 2015-02-19 ENCOUNTER — Inpatient Hospital Stay (HOSPITAL_COMMUNITY): Payer: Medicaid Other

## 2015-02-19 DIAGNOSIS — I953 Hypotension of hemodialysis: Secondary | ICD-10-CM | POA: Diagnosis present

## 2015-02-19 DIAGNOSIS — M79662 Pain in left lower leg: Secondary | ICD-10-CM

## 2015-02-19 DIAGNOSIS — Z8673 Personal history of transient ischemic attack (TIA), and cerebral infarction without residual deficits: Secondary | ICD-10-CM

## 2015-02-19 DIAGNOSIS — E46 Unspecified protein-calorie malnutrition: Secondary | ICD-10-CM | POA: Diagnosis present

## 2015-02-19 DIAGNOSIS — R6520 Severe sepsis without septic shock: Secondary | ICD-10-CM | POA: Diagnosis present

## 2015-02-19 DIAGNOSIS — Y95 Nosocomial condition: Secondary | ICD-10-CM | POA: Diagnosis present

## 2015-02-19 DIAGNOSIS — J189 Pneumonia, unspecified organism: Secondary | ICD-10-CM | POA: Diagnosis present

## 2015-02-19 DIAGNOSIS — G4730 Sleep apnea, unspecified: Secondary | ICD-10-CM | POA: Diagnosis present

## 2015-02-19 DIAGNOSIS — J90 Pleural effusion, not elsewhere classified: Secondary | ICD-10-CM | POA: Diagnosis present

## 2015-02-19 DIAGNOSIS — M329 Systemic lupus erythematosus, unspecified: Secondary | ICD-10-CM | POA: Diagnosis present

## 2015-02-19 DIAGNOSIS — G9340 Encephalopathy, unspecified: Secondary | ICD-10-CM | POA: Diagnosis present

## 2015-02-19 DIAGNOSIS — Z79899 Other long term (current) drug therapy: Secondary | ICD-10-CM | POA: Diagnosis not present

## 2015-02-19 DIAGNOSIS — Z7901 Long term (current) use of anticoagulants: Secondary | ICD-10-CM | POA: Diagnosis not present

## 2015-02-19 DIAGNOSIS — R1030 Lower abdominal pain, unspecified: Secondary | ICD-10-CM

## 2015-02-19 DIAGNOSIS — F419 Anxiety disorder, unspecified: Secondary | ICD-10-CM | POA: Diagnosis present

## 2015-02-19 DIAGNOSIS — R7881 Bacteremia: Secondary | ICD-10-CM | POA: Diagnosis not present

## 2015-02-19 DIAGNOSIS — Z1621 Resistance to vancomycin: Secondary | ICD-10-CM | POA: Diagnosis present

## 2015-02-19 DIAGNOSIS — R1310 Dysphagia, unspecified: Secondary | ICD-10-CM | POA: Diagnosis present

## 2015-02-19 DIAGNOSIS — D631 Anemia in chronic kidney disease: Secondary | ICD-10-CM | POA: Diagnosis present

## 2015-02-19 DIAGNOSIS — D6859 Other primary thrombophilia: Secondary | ICD-10-CM | POA: Diagnosis present

## 2015-02-19 DIAGNOSIS — E785 Hyperlipidemia, unspecified: Secondary | ICD-10-CM | POA: Diagnosis present

## 2015-02-19 DIAGNOSIS — F05 Delirium due to known physiological condition: Secondary | ICD-10-CM | POA: Diagnosis present

## 2015-02-19 DIAGNOSIS — F29 Unspecified psychosis not due to a substance or known physiological condition: Secondary | ICD-10-CM | POA: Diagnosis not present

## 2015-02-19 DIAGNOSIS — I120 Hypertensive chronic kidney disease with stage 5 chronic kidney disease or end stage renal disease: Secondary | ICD-10-CM | POA: Diagnosis present

## 2015-02-19 DIAGNOSIS — E43 Unspecified severe protein-calorie malnutrition: Secondary | ICD-10-CM | POA: Diagnosis present

## 2015-02-19 DIAGNOSIS — R222 Localized swelling, mass and lump, trunk: Secondary | ICD-10-CM | POA: Diagnosis not present

## 2015-02-19 DIAGNOSIS — A419 Sepsis, unspecified organism: Secondary | ICD-10-CM

## 2015-02-19 DIAGNOSIS — F22 Delusional disorders: Secondary | ICD-10-CM | POA: Diagnosis not present

## 2015-02-19 DIAGNOSIS — R05 Cough: Secondary | ICD-10-CM

## 2015-02-19 DIAGNOSIS — M160 Bilateral primary osteoarthritis of hip: Secondary | ICD-10-CM | POA: Diagnosis present

## 2015-02-19 DIAGNOSIS — N186 End stage renal disease: Secondary | ICD-10-CM | POA: Diagnosis present

## 2015-02-19 DIAGNOSIS — E873 Alkalosis: Secondary | ICD-10-CM | POA: Diagnosis present

## 2015-02-19 DIAGNOSIS — M7989 Other specified soft tissue disorders: Secondary | ICD-10-CM | POA: Diagnosis not present

## 2015-02-19 DIAGNOSIS — R1311 Dysphagia, oral phase: Secondary | ICD-10-CM | POA: Diagnosis present

## 2015-02-19 DIAGNOSIS — Z992 Dependence on renal dialysis: Secondary | ICD-10-CM

## 2015-02-19 DIAGNOSIS — M3213 Lung involvement in systemic lupus erythematosus: Secondary | ICD-10-CM | POA: Diagnosis not present

## 2015-02-19 DIAGNOSIS — D62 Acute posthemorrhagic anemia: Secondary | ICD-10-CM | POA: Diagnosis present

## 2015-02-19 DIAGNOSIS — L8990 Pressure ulcer of unspecified site, unspecified stage: Secondary | ICD-10-CM | POA: Insufficient documentation

## 2015-02-19 DIAGNOSIS — R410 Disorientation, unspecified: Secondary | ICD-10-CM | POA: Diagnosis not present

## 2015-02-19 DIAGNOSIS — T82838A Hemorrhage of vascular prosthetic devices, implants and grafts, initial encounter: Secondary | ICD-10-CM | POA: Diagnosis not present

## 2015-02-19 DIAGNOSIS — T8579XA Infection and inflammatory reaction due to other internal prosthetic devices, implants and grafts, initial encounter: Secondary | ICD-10-CM

## 2015-02-19 DIAGNOSIS — R509 Fever, unspecified: Secondary | ICD-10-CM

## 2015-02-19 DIAGNOSIS — L89312 Pressure ulcer of right buttock, stage 2: Secondary | ICD-10-CM | POA: Diagnosis not present

## 2015-02-19 DIAGNOSIS — K580 Irritable bowel syndrome with diarrhea: Secondary | ICD-10-CM | POA: Diagnosis present

## 2015-02-19 DIAGNOSIS — I471 Supraventricular tachycardia: Secondary | ICD-10-CM | POA: Diagnosis present

## 2015-02-19 DIAGNOSIS — M3219 Other organ or system involvement in systemic lupus erythematosus: Secondary | ICD-10-CM | POA: Diagnosis not present

## 2015-02-19 DIAGNOSIS — F323 Major depressive disorder, single episode, severe with psychotic features: Secondary | ICD-10-CM | POA: Diagnosis present

## 2015-02-19 DIAGNOSIS — Z91018 Allergy to other foods: Secondary | ICD-10-CM | POA: Diagnosis not present

## 2015-02-19 DIAGNOSIS — A4181 Sepsis due to Enterococcus: Principal | ICD-10-CM | POA: Diagnosis present

## 2015-02-19 DIAGNOSIS — R918 Other nonspecific abnormal finding of lung field: Secondary | ICD-10-CM

## 2015-02-19 DIAGNOSIS — B952 Enterococcus as the cause of diseases classified elsewhere: Secondary | ICD-10-CM | POA: Diagnosis present

## 2015-02-19 DIAGNOSIS — M797 Fibromyalgia: Secondary | ICD-10-CM | POA: Diagnosis present

## 2015-02-19 DIAGNOSIS — B957 Other staphylococcus as the cause of diseases classified elsewhere: Secondary | ICD-10-CM | POA: Diagnosis present

## 2015-02-19 DIAGNOSIS — E876 Hypokalemia: Secondary | ICD-10-CM | POA: Diagnosis present

## 2015-02-19 DIAGNOSIS — D6189 Other specified aplastic anemias and other bone marrow failure syndromes: Secondary | ICD-10-CM | POA: Diagnosis not present

## 2015-02-19 DIAGNOSIS — D61818 Other pancytopenia: Secondary | ICD-10-CM | POA: Diagnosis present

## 2015-02-19 DIAGNOSIS — D696 Thrombocytopenia, unspecified: Secondary | ICD-10-CM

## 2015-02-19 DIAGNOSIS — B9689 Other specified bacterial agents as the cause of diseases classified elsewhere: Secondary | ICD-10-CM | POA: Diagnosis present

## 2015-02-19 DIAGNOSIS — R0902 Hypoxemia: Secondary | ICD-10-CM | POA: Diagnosis not present

## 2015-02-19 DIAGNOSIS — Z6832 Body mass index (BMI) 32.0-32.9, adult: Secondary | ICD-10-CM

## 2015-02-19 DIAGNOSIS — K219 Gastro-esophageal reflux disease without esophagitis: Secondary | ICD-10-CM | POA: Diagnosis present

## 2015-02-19 DIAGNOSIS — N390 Urinary tract infection, site not specified: Secondary | ICD-10-CM | POA: Diagnosis present

## 2015-02-19 DIAGNOSIS — I63511 Cerebral infarction due to unspecified occlusion or stenosis of right middle cerebral artery: Secondary | ICD-10-CM | POA: Diagnosis not present

## 2015-02-19 DIAGNOSIS — Z9889 Other specified postprocedural states: Secondary | ICD-10-CM

## 2015-02-19 DIAGNOSIS — M79605 Pain in left leg: Secondary | ICD-10-CM | POA: Diagnosis present

## 2015-02-19 DIAGNOSIS — J869 Pyothorax without fistula: Secondary | ICD-10-CM | POA: Diagnosis not present

## 2015-02-19 DIAGNOSIS — M3214 Glomerular disease in systemic lupus erythematosus: Secondary | ICD-10-CM | POA: Diagnosis present

## 2015-02-19 DIAGNOSIS — A4152 Sepsis due to Pseudomonas: Secondary | ICD-10-CM | POA: Diagnosis not present

## 2015-02-19 DIAGNOSIS — R059 Cough, unspecified: Secondary | ICD-10-CM

## 2015-02-19 DIAGNOSIS — M25569 Pain in unspecified knee: Secondary | ICD-10-CM

## 2015-02-19 DIAGNOSIS — R109 Unspecified abdominal pain: Secondary | ICD-10-CM

## 2015-02-19 LAB — PROTIME-INR
INR: 2.47 — AB (ref 0.00–1.49)
PROTHROMBIN TIME: 26.5 s — AB (ref 11.6–15.2)

## 2015-02-19 LAB — CBC WITH DIFFERENTIAL/PLATELET
BASOS ABS: 0 10*3/uL (ref 0.0–0.1)
Basophils Relative: 0 %
Eosinophils Absolute: 0 10*3/uL (ref 0.0–0.7)
Eosinophils Relative: 0 %
HEMATOCRIT: 34.7 % — AB (ref 36.0–46.0)
Hemoglobin: 10.5 g/dL — ABNORMAL LOW (ref 12.0–15.0)
LYMPHS PCT: 10 %
Lymphs Abs: 0.7 10*3/uL (ref 0.7–4.0)
MCH: 29.1 pg (ref 26.0–34.0)
MCHC: 30.3 g/dL (ref 30.0–36.0)
MCV: 96.1 fL (ref 78.0–100.0)
Monocytes Absolute: 0.4 10*3/uL (ref 0.1–1.0)
Monocytes Relative: 6 %
NEUTROS ABS: 6.2 10*3/uL (ref 1.7–7.7)
NEUTROS PCT: 85 %
Platelets: 66 10*3/uL — ABNORMAL LOW (ref 150–400)
RBC: 3.61 MIL/uL — AB (ref 3.87–5.11)
RDW: 17.7 % — ABNORMAL HIGH (ref 11.5–15.5)
WBC: 7.4 10*3/uL (ref 4.0–10.5)

## 2015-02-19 LAB — LACTIC ACID, PLASMA: LACTIC ACID, VENOUS: 2.8 mmol/L — AB (ref 0.5–2.0)

## 2015-02-19 LAB — COMPREHENSIVE METABOLIC PANEL
ALBUMIN: 2.3 g/dL — AB (ref 3.5–5.0)
ALT: 18 U/L (ref 14–54)
ANION GAP: 10 (ref 5–15)
AST: 32 U/L (ref 15–41)
Alkaline Phosphatase: 101 U/L (ref 38–126)
BILIRUBIN TOTAL: 0.7 mg/dL (ref 0.3–1.2)
BUN: 6 mg/dL (ref 6–20)
CO2: 29 mmol/L (ref 22–32)
Calcium: 8.6 mg/dL — ABNORMAL LOW (ref 8.9–10.3)
Chloride: 100 mmol/L — ABNORMAL LOW (ref 101–111)
Creatinine, Ser: 3.97 mg/dL — ABNORMAL HIGH (ref 0.44–1.00)
GFR, EST AFRICAN AMERICAN: 15 mL/min — AB (ref >60)
GFR, EST NON AFRICAN AMERICAN: 13 mL/min — AB (ref >60)
GLUCOSE: 107 mg/dL — AB (ref 65–99)
POTASSIUM: 4 mmol/L (ref 3.5–5.1)
Sodium: 139 mmol/L (ref 135–145)
TOTAL PROTEIN: 5.7 g/dL — AB (ref 6.5–8.1)

## 2015-02-19 LAB — CBC
HCT: 35.4 % — ABNORMAL LOW (ref 36.0–46.0)
Hemoglobin: 10.7 g/dL — ABNORMAL LOW (ref 12.0–15.0)
MCH: 29.3 pg (ref 26.0–34.0)
MCHC: 30.2 g/dL (ref 30.0–36.0)
MCV: 97 fL (ref 78.0–100.0)
PLATELETS: 183 10*3/uL (ref 150–400)
RBC: 3.65 MIL/uL — ABNORMAL LOW (ref 3.87–5.11)
RDW: 17.8 % — ABNORMAL HIGH (ref 11.5–15.5)
WBC: 9.1 10*3/uL (ref 4.0–10.5)

## 2015-02-19 LAB — TSH: TSH: 4.157 u[IU]/mL (ref 0.350–4.500)

## 2015-02-19 LAB — GLUCOSE, CAPILLARY: GLUCOSE-CAPILLARY: 72 mg/dL (ref 65–99)

## 2015-02-19 LAB — MAGNESIUM: Magnesium: 1.8 mg/dL (ref 1.7–2.4)

## 2015-02-19 LAB — PHOSPHORUS: PHOSPHORUS: 2.5 mg/dL (ref 2.5–4.6)

## 2015-02-19 MED ORDER — SODIUM CHLORIDE 0.9 % IV SOLN
1750.0000 mg | Freq: Once | INTRAVENOUS | Status: AC
  Administered 2015-02-19: 1750 mg via INTRAVENOUS
  Filled 2015-02-19: qty 1750

## 2015-02-19 MED ORDER — SODIUM CHLORIDE 0.9 % IJ SOLN
3.0000 mL | Freq: Two times a day (BID) | INTRAMUSCULAR | Status: DC
  Administered 2015-02-19 – 2015-03-22 (×46): 3 mL via INTRAVENOUS

## 2015-02-19 MED ORDER — WARFARIN - PHARMACIST DOSING INPATIENT
Freq: Every day | Status: DC
  Administered 2015-02-19: 18:00:00

## 2015-02-19 MED ORDER — ONDANSETRON HCL 4 MG/2ML IJ SOLN
4.0000 mg | Freq: Four times a day (QID) | INTRAMUSCULAR | Status: DC | PRN
  Administered 2015-02-27 – 2015-03-22 (×7): 4 mg via INTRAVENOUS
  Filled 2015-02-19 (×5): qty 2

## 2015-02-19 MED ORDER — RENA-VITE PO TABS
1.0000 | ORAL_TABLET | Freq: Every day | ORAL | Status: DC
  Administered 2015-02-19 – 2015-02-21 (×3): 1 via ORAL
  Filled 2015-02-19 (×5): qty 1

## 2015-02-19 MED ORDER — HYDROXYCHLOROQUINE SULFATE 200 MG PO TABS
400.0000 mg | ORAL_TABLET | Freq: Every day | ORAL | Status: DC
  Administered 2015-02-19: 400 mg via ORAL
  Filled 2015-02-19 (×3): qty 2

## 2015-02-19 MED ORDER — ALPRAZOLAM 0.25 MG PO TABS
0.1250 mg | ORAL_TABLET | Freq: Once | ORAL | Status: AC
Start: 2015-02-19 — End: 2015-02-19
  Administered 2015-02-19: 0.125 mg via ORAL
  Filled 2015-02-19: qty 1

## 2015-02-19 MED ORDER — ONDANSETRON HCL 4 MG PO TABS
4.0000 mg | ORAL_TABLET | Freq: Four times a day (QID) | ORAL | Status: DC | PRN
  Administered 2015-03-04 – 2015-03-17 (×4): 4 mg via ORAL
  Filled 2015-02-19 (×5): qty 1

## 2015-02-19 MED ORDER — WARFARIN SODIUM 5 MG PO TABS
5.0000 mg | ORAL_TABLET | Freq: Every day | ORAL | Status: DC
Start: 2015-02-19 — End: 2015-02-20
  Administered 2015-02-19: 5 mg via ORAL
  Filled 2015-02-19 (×2): qty 1

## 2015-02-19 MED ORDER — POLYETHYLENE GLYCOL 3350 17 G PO PACK
17.0000 g | PACK | Freq: Every day | ORAL | Status: DC | PRN
  Filled 2015-02-19: qty 1

## 2015-02-19 MED ORDER — QUETIAPINE FUMARATE 25 MG PO TABS
25.0000 mg | ORAL_TABLET | Freq: Two times a day (BID) | ORAL | Status: DC
  Administered 2015-02-19 – 2015-02-22 (×6): 25 mg via ORAL
  Filled 2015-02-19 (×9): qty 1

## 2015-02-19 MED ORDER — DEXTROSE 5 % IV SOLN
1.0000 g | INTRAVENOUS | Status: DC
  Administered 2015-02-19 – 2015-02-21 (×3): 1 g via INTRAVENOUS
  Filled 2015-02-19 (×5): qty 1

## 2015-02-19 MED ORDER — LORAZEPAM 2 MG/ML IJ SOLN
0.5000 mg | Freq: Once | INTRAMUSCULAR | Status: AC
  Administered 2015-02-19: 0.5 mg via INTRAVENOUS
  Filled 2015-02-19: qty 1

## 2015-02-19 MED ORDER — METOPROLOL SUCCINATE ER 25 MG PO TB24
25.0000 mg | ORAL_TABLET | Freq: Every day | ORAL | Status: DC
  Administered 2015-02-19: 25 mg via ORAL
  Filled 2015-02-19 (×2): qty 1

## 2015-02-19 MED ORDER — ALPRAZOLAM 0.25 MG PO TABS
0.5000 mg | ORAL_TABLET | Freq: Once | ORAL | Status: AC
  Administered 2015-02-19: 0.5 mg via ORAL
  Filled 2015-02-19: qty 2

## 2015-02-19 MED ORDER — ATORVASTATIN CALCIUM 20 MG PO TABS
20.0000 mg | ORAL_TABLET | Freq: Every day | ORAL | Status: DC
  Administered 2015-02-19 – 2015-02-21 (×3): 20 mg via ORAL
  Filled 2015-02-19 (×5): qty 1

## 2015-02-19 MED ORDER — DARBEPOETIN ALFA 200 MCG/0.4ML IJ SOSY
200.0000 ug | PREFILLED_SYRINGE | INTRAMUSCULAR | Status: DC
  Filled 2015-02-19: qty 0.4

## 2015-02-19 MED ORDER — ALPRAZOLAM 0.25 MG PO TABS
0.1250 mg | ORAL_TABLET | Freq: Once | ORAL | Status: AC
  Administered 2015-02-19: 0.125 mg via ORAL
  Filled 2015-02-19: qty 1

## 2015-02-19 MED ORDER — PANTOPRAZOLE SODIUM 20 MG PO TBEC
20.0000 mg | DELAYED_RELEASE_TABLET | Freq: Every day | ORAL | Status: DC
  Administered 2015-02-19: 20 mg via ORAL
  Filled 2015-02-19 (×2): qty 1

## 2015-02-19 NOTE — Progress Notes (Signed)
Patient transported back to [facility] at 2030 via hospital bed with nurse techs from HD. 1300 ml fluid taken off in HD. Vital signs T 98.8 Bp 127/76 P102  R 18 O2 Sats 96/RA. Patient progressively more confused during the shift. Patient yelling out, pressing call bell over and over with extreme anxiety and hallucinations. [place], PA paged. Orders given for Xanax 0.125 one time dose. Vital signs T98.9 P 150 BP 153/99 O2 Sats 100/RA. Rapid response nurse Jerene Pitch) paged. Rectal temp 100.4 BP 177/121 P 152 O2 Sats 100/RA. Orders given from PA : EKG, Lactic Acid, Stat CT of Head, repeat xanax 0.125 x 1. Patient more calm. Patient transported to CT with rapid response nurse and nurse tech. Nurse will continue to monitor patients status. adm

## 2015-02-19 NOTE — H&P (Signed)
Date: 02/19/2015               Patient Name:  [name] MRN: [id number]  DOB: Sep 14, 1975 Age / Sex: 39 y.o., female   PCP: Lin Landsman, MD         Medical Service: Internal Medicine Teaching Service         Attending Physician: Dr. Axel Filler, MD    First Contact: Dr. Benjamine Mola Pager: [phone]  Second Contact: Dr. Arcelia Jew Pager: [phone]       After Hours (After 5p/  First Contact Pager: [phone]  weekends / holidays): Second Contact Pager: [phone]   Chief Complaint: Auditory Hallucinations  History of Present Illness: [name] is a 39 y.o. 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 Per the rehab physician and nursing staff, the patient started stating that there was someone else in the room telling her what to do. This has been going on for the past 24 hours or so and seems to be getting worse. This is also accompanied by tachycardia and low grade fever. On exam, the patient states that her "ex-boyfriend's wife, [name]" is hiding under the bed speaking to her. She is reportedly telling her the healthcare professionals caring for her are lying to her. Otherwise, the patient does not report any other significant symptoms. She says she feels anxious and depressed, has some mild intermittent chest pain and generalized weakness, but states this is not new. She is able to answer questions appropriately, is alert and oriented x2 (could not state the year), but knows that Thanksgiving is next week. Other than auditory hallucinations, her cognition and speech are intact.  Patient has had a very complex history recently with prolonged hospital admission, HD catheter infection, pseudomonas UTI, recent stroke, and murantic endocarditis. Per CIR physician, patient was supposed to go home today given her significant improvement in rehab, however, now she has  significant change in mental status.   CT head performed in CIR this AM shows no acute abnormality, only chronic, right frontal CVA, reported as evolved since previous CT head. EKG shows sinus tachycardia, lactic acid 2.8. Temp this AM 100.4, Pulse 154, BP elevated to 166/82.   Meds: Current Facility-Administered Medications  Medication Dose Route Frequency Provider Last Rate Last Dose  . atorvastatin (LIPITOR) tablet 20 mg  20 mg Oral q1800 Corky Sox, MD      . Derrill Memo ON 02/21/2015] Darbepoetin Alfa (ARANESP) injection 200 mcg  200 mcg Intravenous Q Thu-HD Corky Sox, MD      . hydroxychloroquine (PLAQUENIL) tablet 400 mg  400 mg Oral Daily Corky Sox, MD      . metoprolol succinate (TOPROL-XL) 24 hr tablet 25 mg  25 mg Oral QHS Corky Sox, MD      . multivitamin (RENA-VIT) tablet 1 tablet  1 tablet Oral QHS Corky Sox, MD      . ondansetron [facility]) tablet 4 mg  4 mg Oral Q6H PRN Corky Sox, MD       Or  . ondansetron [facility]) injection 4 mg  4 mg Intravenous Q6H PRN Corky Sox, MD      . pantoprazole (PROTONIX) EC tablet 20 mg  20 mg Oral Daily Corky Sox, MD      . polyethylene glycol (MIRALAX / GLYCOLAX) packet 17 g  17 g Oral Daily PRN Ernst Bowler  Ronnald Ramp, MD      . sodium chloride 0.9 % injection 3 mL  3 mL Intravenous Q12H Corky Sox, MD        Allergies: Allergies as of 02/19/2015 - Review Complete 02/04/2015  Allergen Reaction Noted  . Food Swelling 08/29/2014   Past Medical History  Diagnosis Date  . Hypertension   . Cardiac arrhythmia   . SVT (supraventricular tachycardia) ([place])     "today, last week, 2 wk ago; maybe 1 month ago, etc; started w/in last 3-4 yrs"(07/20/2012)  . Fainting     "~ 1 month ago; probably related to SVT" (07/20/2012)  . Shortness of breath     "related to SVT episodes" (07/20/2012)  . Chest pain at rest     "related to SVT" (07/20/2012)  . Lupus (systemic lupus erythematosus) ([place])   . GERD (gastroesophageal reflux disease)   .  Fibromyalgia   . Irritable bowel syndrome (IBS)   . Hypercholesterolemia   . Heart murmur     "small" (12/25/2014)  . TIA (transient ischemic attack) 12/21/2014  . Sleep apnea     "don't wear mask anymore" (12/25/2014)  . Anemia   . History of blood transfusion "several"    "related to low counts"  . Daily headache   . Arthritis     "hips" (12/25/2014)  . Anxiety     "sometimes; don't take anything for it" (12/25/2014)  . ESRD (end stage renal disease) on dialysis ([place]) since 12/07/2014    Diagnosed Aug 2014 w SLE nephritis DPGN by biopsy, rx cellcept/ steroids.  Repeat biopsy early 2016 membranous w/o activity so meds weaned off. Big flare Aug '16 creat 6, 3rd biopsy DPGN, meds resumed. Ended up starting HD Sept 2016  . Stroke ([place])   . History of vascular access device     2016: Sept 9  R IJ cath per IR.  Sept 20 not candidate for fistula due to disease veins, had LUA hybrid graft placed. Sept 21 - steal syndrome, LUA AVG ligated. Oct 7 - new left femoral Gore-tex loop graft per [facility]VS. Oct 29 - removal R IJ tunneled HD cath   Past Surgical History  Procedure Laterality Date  . Cesarean section  2001; 2010  . Cervical biopsy  2013  . Supraventricular tachycardia ablation  07/21/12     Dr. Cristopher Peru   . Peripheral vascular catheterization N/A 12/14/2014    Procedure: Upper Extremity Venography;  Surgeon: Elam Dutch, MD;  Location: [facility];  Service: Cardiovascular;  Laterality: N/A;  . Insertion hybrid anteriovenous gortex graft Left 12/18/2014    Procedure: INSERTION GORE HYBRID ARTERIOVENOUS GRAFT LEFT AXILLO-BRACHIAL.;  Surgeon: Serafina Mitchell, MD;  Location: [facility] OR;  Service: Vascular;  Laterality: Left;  . Ligation of arteriovenous  fistula Left 12/19/2014    Procedure: LIGATION OF FISTULA;  Surgeon: Elam Dutch, MD;  Location: [facility];  Service: Vascular;  Laterality: Left;  . Appendectomy  2011  . Tubal ligation  2010  . Tee without cardioversion N/A 12/25/2014     Procedure: TRANSESOPHAGEAL ECHOCARDIOGRAM (TEE);  Surgeon: Sueanne Margarita, MD;  Location: [facility];  Service: Cardiovascular;  Laterality: N/A;  . Av fistula placement Left 01/04/2015    Procedure: INSERTION OF ARTERIOVENOUS (AV) GORE-TEX GRAFT THIGH;  Surgeon: Rosetta Posner, MD;  Location: [facility];  Service: Vascular;  Laterality: Left;  . Tee without cardioversion N/A 01/30/2015    Procedure: TRANSESOPHAGEAL ECHOCARDIOGRAM (TEE);  Surgeon: Lelon Perla,  MD;  Location: [facility];  Service: Cardiovascular;  Laterality: N/A;   Family History  Problem Relation Age of Onset  . Cancer Mother     breast and ovarian  . BRCA 1/2 Sister    Social History   Social History  . Marital Status: Single    Spouse Name: N/A  . Number of Children: N/A  . Years of Education: N/A   Occupational History  . Not on file.   Social History Main Topics  . Smoking status: Never Smoker   . Smokeless tobacco: Never Used  . Alcohol Use: No     Comment: 07/20/2012 "might have a beer once/yr"  . Drug Use: No  . Sexual Activity: Not Currently    Birth Control/ Protection: None   Other Topics Concern  . Not on file   Social History Narrative    Review of Systems:  General: Positive for fever and fatigue. Denies diaphoresis, appetite change.  Respiratory: Denies SOB, cough, and wheezing.  Cardiovascular: Positive for intermittent chest pain and palpitations.  Gastrointestinal: Positive for nausea. Denies vomiting, abdominal pain, and diarrhea Musculoskeletal: Denies myalgias, arthralgias, back pain, and gait problem.  Neurological: Positive for generalized weakness. Denies dizziness, syncope, lightheadedness, and headaches.  Psychiatric/Behavioral: Positive for mood changes. Denies sleep disturbance, and agitation.  Physical Exam: Blood pressure 149/85, pulse 140, temperature 98.9 F (37.2 C), temperature source Oral, resp. rate 18, weight 181 lb (82.1 kg), last menstrual period 01/21/2015,  SpO2 98 %.  General: Morbidly obese AA female, alert, cooperative, NAD. HEENT: PERRL, EOMI. Moist mucus membranes. Neck: Full range of motion without pain, supple, no lymphadenopathy or carotid bruits Lungs: Clear to ascultation bilaterally, normal work of respiration, no wheezes, rales, rhonchi Heart: Quite tachycardic, unable to appreciate a murmur, gallops, or rub based on HR.  Abdomen: Soft, non-tender, non-distended, BS + Extremities: No cyanosis or clubbing. LLE with +1 pitting edema. Left femoral AVG with thrill/bruit.  Neurologic: Alert & oriented x2, cranial nerves II-XII intact, 4/5 strength in the right upper and lower extremities (chronic), sensation intact to light touch. Patient reports auditory hallucinations. Does not report "seeing" anyone else in the room but could not specify. Otherwise, cognition and speech intact. Able to identify several 4-legged animals, state how an apple and an orange are similar.   Lab results: Basic Metabolic Panel:  Recent Labs  02/16/15 1913 02/18/15 1615  NA 137 135  K 3.4* 3.2*  CL 100* 100*  CO2 28 26  GLUCOSE 73 93  BUN 9 9  CREATININE 5.24* 5.06*  CALCIUM 8.9 8.5*  PHOS 3.0 2.9   Liver Function Tests:  Recent Labs  02/16/15 1913 02/18/15 1615  ALBUMIN 2.0* 1.9*   CBC:  Recent Labs  02/16/15 1912 02/18/15 1615  WBC 5.8 5.6  HGB 9.5* 9.0*  HCT 32.3* 30.5*  MCV 97.3 96.5  PLT 192 140*   CBG:  Recent Labs  02/19/15 0233  GLUCAP 72   Coagulation:  Recent Labs  02/18/15 0614 02/19/15 0705  LABPROT 24.9* 26.5*  INR 2.28* 2.47*   Urine Drug Screen: Drugs of Abuse     Component Value Date/Time   LABOPIA NONE DETECTED 07/21/2012 0215   COCAINSCRNUR NONE DETECTED 07/21/2012 0215   LABBENZ NONE DETECTED 07/21/2012 0215   AMPHETMU NONE DETECTED 07/21/2012 0215   THCU NONE DETECTED 07/21/2012 0215   LABBARB NONE DETECTED 07/21/2012 0215     Imaging results:  Ct Head Wo Contrast  02/19/2015  CLINICAL  DATA:  Acute onset  of severe confusion. Auditory and visual hallucinations for more than 12 hours. Initial encounter. EXAM: CT HEAD WITHOUT CONTRAST TECHNIQUE: Contiguous axial images were obtained from the base of the skull through the vertex without intravenous contrast. COMPARISON:  CT of the head, and MRI of the brain performed 01/25/2015 FINDINGS: There is no evidence of acute infarction, mass lesion, or intra- or extra-axial hemorrhage on CT. Prominence of the ventricles suggests mild cortical volume loss. Mild cerebellar atrophy is noted. An evolving relatively chronic right anterior MCA territory infarct is again noted, predominantly at the right frontoparietal region. The brainstem and fourth ventricle are within normal limits. The basal ganglia are unremarkable in appearance. The cerebral hemispheres demonstrate grossly normal gray-white differentiation. No mass effect or midline shift is seen. There is no evidence of fracture; visualized osseous structures are unremarkable in appearance. The visualized portions of the orbits are within normal limits. A mucus retention cyst or polyp is noted at the right side of the sphenoid sinus. The remaining paranasal sinuses and mastoid air cells are well-aerated. No significant soft tissue abnormalities are seen. IMPRESSION: 1. No acute intracranial pathology seen on CT. 2. 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 3. Mild cortical volume loss noted. 4. Mucus retention cyst or polyp at the right side of the sphenoid sinus. Electronically Signed   By: Garald Balding M.D.   On: 02/19/2015 06:57   Dg Chest Port 1 View  02/19/2015  CLINICAL DATA:  Fever. EXAM: PORTABLE CHEST 1 VIEW COMPARISON:  02/09/2015 FINDINGS: Left retrocardiac opacity is similar to the prior study. This may reflect atelectasis or infiltrate. There may be a small associated effusion. Remainder of the lungs is  clear. No right pleural effusion. No pneumothorax. Cardiac silhouette is mildly enlarged. No mediastinal or hilar masses or evidence of adenopathy. Left axillary 2 brachia region stent is stable. Skeletal structures are unremarkable. IMPRESSION: Mild left retrocardiac lung base opacity. This is likely atelectasis possibly with a small effusion. Pneumonia is possible. Electronically Signed   By: Lajean Manes M.D.   On: 02/19/2015 10:16    Other results: EKG: Sinus tachycardia, rate 149. LVH.   Assessment & Plan by Problem: [name] is a 39 y.o. 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  Auditory/Psychic Hallucinations: Suspect this is a manifestation of acute delirium, started over the past 24 hours. Patient states there is a girl named Chastity hiding under her bed telling her things. She otherwise can identify other people in the room. This imaginary person is talking to her during the examination, telling her that people are lying to her. Otherwise the patient has relatively normal cognition, memory, and speech. She is able to identify objects, could state that Thanksgiving was coming, could name several 4-legged animals, exhibited normal word repetition, and memory. She follows commands and her neurological examination is otherwise only significant for some mild left-sided weakness which is expected given herr recent R. MCA CVA. Patient also has a mild fever and tachycardia. Sepsis is the top differential given her recent admission for pseumodonas bacteremia for which she completed a prolonged course of Ceftazidime earlier this month. Other possibilities include stroke, although there are no other significant neuro abnormalities on exam, CT head with no new findings. No sign of acute hemorrhage, obviously a concern given that she is on Coumadin for her  recent embolic  stroke. Epilepsy/seizures can result in auditory hallucinations, although not typically complex such as voices, and unlikely to be accompanied by tachycardia and fever. PE unlikely given INR >2.0. 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 Unlikely to be 2/2 medications as she is no longer on antibiotics and has not been started on any new medications that could cause such findings. Lastly, her BP is elevated for her this AM, as high as 765'Y systolic, may be hypertensive encephalopathy, however, it seems these symptoms started prior to elevated BP, more likely to be related to agitation and anxiety. Do not suspect hepatic encephalopathy given no history of liver disease, not typical presentation.  -Admit to telemetry -Cultures; blood, urine -CXR -CBC, CMP -Hold ABx for now until cultures drawn and possible source of infection identified, unless patient's clinical status worsens.  -Continue sitter at bedside -C3, C4, total complement levels -Hold Midodrine for now given elevated BP -Can consider Haldol prn for agitation if necessary. Would hold off on further benzo use for anxiety/agitation -Could consider EEG if no other etiology identified for AMS -HOLD Percocet -Tylenol prn for fever  SLE: Currently on Plaquenil, which she has been on since recent admission. Possibility that AMS is related to lupus? -Continue Plaquenil -Complement levels as above -HD per nephrology.   ESRD on HD: Stable. Do not suspect renal issues are contributing to AMS.  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 Still with residual left-sided weakness.  -Continue Coumadin -Lipitor  H/o SVT/Tachycardia: Unclear history, has apparently had an ablation in 2014, Follows with Dr. Einar Gip. Suspect this is the reason for her Toprol-XL. Has baseline tachycardia, now in the 150's.  -Continue Toprol-XL   H/o  Hypotension: Currently hypertensive, most likely related to anxiety.  -Hold Midodrine for now, restart if BP decreases  DVT/PE PPx: Coumadin  Dispo: Disposition is deferred at this time, awaiting improvement of current medical problems. Anticipated discharge in approximately 2-3 day(s).   The patient does have a current PCP Lin Landsman, MD) and does need an [facility] hospital follow-up appointment after discharge.  The patient does not have transportation limitations that hinder transportation to clinic appointments.  Signed: Corky Sox, MD 02/19/2015, 11:21 AM

## 2015-02-19 NOTE — Progress Notes (Signed)
Social Work  Discharge Note  The overall goal for the admission was met for:   Discharge location: NO-TRANSFERRING TO [place] Length of Stay: NO-15 DAYS  Discharge activity level: Yes-SUPERVISION/MOD/I LEVEL  Home/community participation: Yes  Services provided included: MD, RD, PT, OT, SLP, RN, CM, TR, Pharmacy, Neuropsych and SW  Financial Services: Medicaid  Follow-up services arranged: Home Health: [facility] CARE-PT,OT,RN, DME: [facility] and Patient/Family request agency HH: ACTIVE PT WITH [facility], DME: USED [facility] BEFORE  Comments (or additional information):MOM AND SON WERE HERE [place]   Patient/Family verbalized understanding of follow-up arrangements: Yes  Individual responsible for coordination of the follow-up plan: SELF & DENISE-MOM  Confirmed correct DME delivered: [name] 02/19/2015    [name]

## 2015-02-19 NOTE — Progress Notes (Signed)
MD notified of sustained heart rate in the 150s. Per order from MD, pt may have metoprolol that is scheduled at 10pm now. Will continue to monitor.

## 2015-02-19 NOTE — Progress Notes (Signed)
Call received per floor RN  At [phone] for Pt with confusion beginning after HD last night. RN also concerned about Pt heart rate 130s. Rehab P.A notified and Xanax po ordered and given. Agitation now improved but RN remains concerned about Pt confusion. RN advised to recheck VS with rectal temp and manual pulse, as well as start EKG while RRT en route. D. Anguilli P.A at bedside upon my arrival. EKG yielding ST. Pt alert, follows simple commands but has very bizarre speech. Pt states "that man over there is telling me your lying", occasionally reaching for objects that are not there. Additional xanax 0.125mg  given and Pt taken to CT scan. Upon arrival back to room Dr. Dianna Limbo at bedside to see Pt. VSS with HR remaining in 150s. RN to monitor Pt closely, day shift RRT made aware of Pt condition.

## 2015-02-19 NOTE — Progress Notes (Signed)
OT Cancellation Note  Patient Details Name: [name] MRN: [id number] DOB: 04-Nov-1975   Cancelled Treatment:    Reason Eval/Treat Not Completed: Medical issues which prohibited therapy. Resting HR in bed currently 153 and BP is elevated,  spoke with nursing and will await eval. Will check back tomorrow.  Almon Register [id number] 02/19/2015, 2:37 PM

## 2015-02-19 NOTE — Progress Notes (Addendum)
ANTIBIOTIC CONSULT NOTE - INITIAL  Pharmacy Consult for fortaz, vancomycin Indication: sepsis  Allergies  Allergen Reactions  . Food Swelling    Red peppers    Patient Measurements: Weight: 181 lb (82.1 kg)   Vital Signs: Temp: 98.9 F (37.2 C) (11/22 1103) Temp Source: Oral (11/22 1103) BP: 149/85 mmHg (11/22 1103) Pulse Rate: 140 (11/22 1103) Intake/Output from previous day:   Intake/Output from this shift:    Labs:  Recent Labs  02/16/15 1912 02/16/15 1913 02/18/15 1615  WBC 5.8  --  5.6  HGB 9.5*  --  9.0*  PLT 192  --  140*  CREATININE  --  5.24* 5.06*   Estimated Creatinine Clearance: 15.2 mL/min (by C-G formula based on Cr of 5.06). No results for input(s): VANCOTROUGH, VANCOPEAK, VANCORANDOM, GENTTROUGH, GENTPEAK, GENTRANDOM, TOBRATROUGH, TOBRAPEAK, TOBRARND, AMIKACINPEAK, AMIKACINTROU, AMIKACIN in the last 72 hours.   Microbiology: Recent Results (from the past 720 hour(s))  Culture, blood (routine x 2)     Status: None   Collection Time: 01/25/15  4:05 AM  Result Value Ref Range Status   Specimen Description BLOOD RIGHT HAND  Final   Special Requests BOTTLES DRAWN AEROBIC AND ANAEROBIC 5CC  Final   Culture NO GROWTH 5 DAYS  Final   Report Status 01/30/2015 FINAL  Final  Culture, blood (routine x 2)     Status: None   Collection Time: 01/25/15  4:34 AM  Result Value Ref Range Status   Specimen Description BLOOD LEFT ARM  Final   Special Requests BOTTLES DRAWN AEROBIC ONLY 10CC  Final   Culture  Setup Time   Final    GRAM NEGATIVE RODS AEROBIC BOTTLE ONLY CRITICAL RESULT CALLED TO, READ BACK BY AND VERIFIED WITH: S HERBERT,RN AT 1351 01/26/15 BY L BENFIELD    Culture   Final    PSEUDOMONAS AERUGINOSA SUSCEPTIBILITIES PERFORMED ON PREVIOUS CULTURE WITHIN THE LAST 5 DAYS.    Report Status 01/28/2015 FINAL  Final  Urine culture     Status: None   Collection Time: 01/25/15  5:13 AM  Result Value Ref Range Status   Specimen Description URINE,  CATHETERIZED  Final   Special Requests NONE  Final   Culture MULTIPLE SPECIES PRESENT, SUGGEST RECOLLECTION  Final   Report Status 01/26/2015 FINAL  Final  Culture, blood (routine x 2)     Status: None   Collection Time: 01/25/15  1:40 PM  Result Value Ref Range Status   Specimen Description BLOOD RIGHT FEMORAL ARTERY  Final   Special Requests BOTTLES DRAWN AEROBIC AND ANAEROBIC 10CC  Final   Culture  Setup Time   Final    GRAM NEGATIVE RODS AEROBIC BOTTLE ONLY CRITICAL RESULT CALLED TO, READ BACK BY AND VERIFIED WITH: Wyn Quaker RN [id number] 01/25/15 A BROWNING    Culture PSEUDOMONAS AERUGINOSA  Final   Report Status 01/27/2015 FINAL  Final   Organism ID, Bacteria PSEUDOMONAS AERUGINOSA  Final      Susceptibility   Pseudomonas aeruginosa - MIC*    CEFTAZIDIME 4 SENSITIVE Sensitive     CIPROFLOXACIN 0.5 SENSITIVE Sensitive     GENTAMICIN <=1 SENSITIVE Sensitive     IMIPENEM 2 SENSITIVE Sensitive     PIP/TAZO 16 SENSITIVE Sensitive     CEFEPIME 8 SENSITIVE Sensitive     * PSEUDOMONAS AERUGINOSA  MRSA PCR Screening     Status: None   Collection Time: 01/25/15  8:35 PM  Result Value Ref Range Status   MRSA by PCR NEGATIVE NEGATIVE  Final    Comment:        The GeneXpert MRSA Assay (FDA approved for NASAL specimens only), is one component of a comprehensive MRSA colonization surveillance program. It is not intended to diagnose MRSA infection nor to guide or monitor treatment for MRSA infections.   Cath Tip Culture     Status: None   Collection Time: 01/26/15  5:23 PM  Result Value Ref Range Status   Specimen Description CATH TIP RIGHT THIGH HEMODIALYSIS CATHETER  Final   Special Requests NONE  Final   Culture LT15 Performed at [facility]   Final   Report Status 01/30/2015 FINAL  Final  C difficile quick scan w PCR reflex     Status: None   Collection Time: 01/27/15  9:46 AM  Result Value Ref Range Status   C Diff antigen NEGATIVE NEGATIVE Final   C Diff toxin  NEGATIVE NEGATIVE Final   C Diff interpretation Negative for toxigenic C. difficile  Final  Culture, blood (routine x 2)     Status: None   Collection Time: 01/28/15  1:42 PM  Result Value Ref Range Status   Specimen Description BLOOD LEFT HAND  Final   Special Requests IN PEDIATRIC BOTTLE  3CC  Final   Culture NO GROWTH 5 DAYS  Final   Report Status 02/02/2015 FINAL  Final  C difficile quick scan w PCR reflex     Status: None   Collection Time: 02/12/15  4:35 PM  Result Value Ref Range Status   C Diff antigen NEGATIVE NEGATIVE Final   C Diff toxin NEGATIVE NEGATIVE Final   C Diff interpretation Negative for toxigenic C. difficile  Final    Medical History: Past Medical History  Diagnosis Date  . Hypertension   . Cardiac arrhythmia   . SVT (supraventricular tachycardia) ([place])     "today, last week, 2 wk ago; maybe 1 month ago, etc; started w/in last 3-4 yrs"(07/20/2012)  . Fainting     "~ 1 month ago; probably related to SVT" (07/20/2012)  . Shortness of breath     "related to SVT episodes" (07/20/2012)  . Chest pain at rest     "related to SVT" (07/20/2012)  . Lupus (systemic lupus erythematosus) ([place])   . GERD (gastroesophageal reflux disease)   . Fibromyalgia   . Irritable bowel syndrome (IBS)   . Hypercholesterolemia   . Heart murmur     "small" (12/25/2014)  . TIA (transient ischemic attack) 12/21/2014  . Sleep apnea     "don't wear mask anymore" (12/25/2014)  . Anemia   . History of blood transfusion "several"    "related to low counts"  . Daily headache   . Arthritis     "hips" (12/25/2014)  . Anxiety     "sometimes; don't take anything for it" (12/25/2014)  . 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  Repeat biopsy early 2016 membranous w/o activity so meds weaned off. Big flare Aug '16 creat 6, 3rd biopsy DPGN, meds resumed. Ended up starting HD Sept 2016  . Stroke  ([place])   . History of vascular access device     2016: Sept 9  R IJ cath per IR.  Sept 20 not candidate for fistula due to disease veins, had LUA hybrid graft placed. Sept 21 - steal syndrome, LUA AVG ligated. Oct 7 - new left femoral Gore-tex loop graft  per [facility]. Oct 29 - removal R IJ tunneled HD cath    Medications:  Scheduled:  . atorvastatin  20 mg Oral q1800  . [START ON 02/21/2015] Darbepoetin Alfa  200 mcg Intravenous Q Thu-HD  . hydroxychloroquine  400 mg Oral Daily  . metoprolol succinate  25 mg Oral QHS  . multivitamin  1 tablet Oral QHS  . pantoprazole  20 mg Oral Daily  . sodium chloride  3 mL Intravenous Q12H  . warfarin  5 mg Oral q1800  . Warfarin - Pharmacist Dosing Inpatient   Does not apply q1800    Assessment: 39 yo female transferred from rehab with confusion. Pharmacy has been consulted to dose fortaz and vancomycin for possible sepsis. She was recently treated for pseudomonas bacteremia. She is noted with ESRD on HD TTS (last HD was 11/21; 400BFR for 4 hrs) -currently on HD holiday schedule  11/22 vanc>> 11/22 fortaz>>  11/22 blood x2 11/22 urine   Antibiotic and culture history Ceftaz 10/31>> 02/09/15  Merrem 10/29>>10/31  Levaquin 10/29>> 10/30  Zosyn 10/28>10/29   10/28 BC x 2>> PSA pan sensitive  10/28 Urine> mult colonies  10/29 R cath tip neg  10/30 C Diff neg  10/31 blood x 2 neg  11/15: c-diff neg   Goal of Therapy:  Vancomycin trough level 15-20 mcg/ml  Plan:  -Fortaz 1gm IV q24hr -Vancomycin 1750mg  IV x1 followed by 1000mg  after HD -Will follow plans for HD daily with holiday schedule -Will follow renal function, cultures and clinical progress  Hildred Laser, Pharm D 02/19/2015 12:43 PM

## 2015-02-19 NOTE — Progress Notes (Signed)
Pt received from Inpatient rehab, Vitals stable but patient is hallucinating., informed MD for the pt's arrival in the unit, waiting for the pharmacy to verify the medicines, pt's mother is in bed side, informed to the charge nurse about the patient's condition, will continue to monitor the patient.

## 2015-02-19 NOTE — Progress Notes (Signed)
ANTICOAGULATION CONSULT NOTE - Initial Consult  Pharmacy Consult for coumadin  Indication: hx of SLE and hypercoagulable state  Allergies  Allergen Reactions  . Food Swelling    Red peppers    Patient Measurements: Weight: 181 lb (82.1 kg)   Vital Signs: Temp: 98.9 F (37.2 C) (11/22 1103) Temp Source: Oral (11/22 1103) BP: 149/85 mmHg (11/22 1103) Pulse Rate: 140 (11/22 1103)  Labs:  Recent Labs  02/16/15 1912 02/16/15 1913 02/17/15 0407 02/18/15 0614 02/18/15 1615 02/19/15 0705  HGB 9.5*  --   --   --  9.0*  --   HCT 32.3*  --   --   --  30.5*  --   PLT 192  --   --   --  140*  --   LABPROT  --   --  21.6* 24.9*  --  26.5*  INR  --   --  1.89* 2.28*  --  2.47*  CREATININE  --  5.24*  --   --  5.06*  --     Estimated Creatinine Clearance: 15.2 mL/min (by C-G formula based on Cr of 5.06).   Medical History: Past Medical History  Diagnosis Date  . Hypertension   . Cardiac arrhythmia   . SVT (supraventricular tachycardia) ([place])     "today, last week, 2 wk ago; maybe 1 month ago, etc; started w/in last 3-4 yrs"(07/20/2012)  . Fainting     "~ 1 month ago; probably related to SVT" (07/20/2012)  . Shortness of breath     "related to SVT episodes" (07/20/2012)  . Chest pain at rest     "related to SVT" (07/20/2012)  . Lupus (systemic lupus erythematosus) ([place])   . GERD (gastroesophageal reflux disease)   . Fibromyalgia   . Irritable bowel syndrome (IBS)   . Hypercholesterolemia   . Heart murmur     "small" (12/25/2014)  . TIA (transient ischemic attack) 12/21/2014  . Sleep apnea     "don't wear mask anymore" (12/25/2014)  . Anemia   . History of blood transfusion "several"    "related to low counts"  . Daily headache   . Arthritis     "hips" (12/25/2014)  . Anxiety     "sometimes; don't take anything for it" (12/25/2014)  . 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  Repeat biopsy early 2016 membranous w/o activity so meds weaned off. Big flare Aug '16 creat 6, 3rd biopsy DPGN, meds resumed. Ended up starting HD Sept 2016  . Stroke ([place])   . History of vascular access device     2016: Sept 9  R IJ cath per IR.  Sept 20 not candidate for fistula due to disease veins, had LUA hybrid graft placed. Sept 21 - steal syndrome, LUA AVG ligated. Oct 7 - new left femoral Gore-tex loop graft per [facility]. Oct 29 - removal R IJ tunneled HD cath    Medications:  Scheduled:  . atorvastatin  20 mg Oral q1800  . [START ON 02/21/2015] Darbepoetin Alfa  200 mcg Intravenous Q Thu-HD  . hydroxychloroquine  400 mg Oral Daily  . metoprolol succinate  25 mg Oral QHS  . multivitamin  1 tablet Oral QHS  . pantoprazole  20 mg Oral Daily  . sodium chloride  3 mL Intravenous Q12H    Assessment: 39 yo female transferred from rehab with confusion. She is on  coumadin for history of SLE, stroke and hypercoagulable state. Pharmacy has been consulted to dose coumadin.  -INR today is 2.47 -She has been on 5mg /day since 02/13/15  Goal of Therapy:  INR 2-3 Monitor platelets by anticoagulation protocol: Yes  Plan:  -Coumadin 5mg  po daily -Daily PT/INR for now  Hildred Laser, Pharm D 02/19/2015 11:52 AM

## 2015-02-19 NOTE — Progress Notes (Signed)
Pt. Going for MRI. MRI requesting pt. Have medication to help keep her  Calm during test. On call for Imts paged.

## 2015-02-19 NOTE — Progress Notes (Signed)
Patient ID: [name], female   DOB: 04-23-1975, 39 y.o.   MRN: [id number]  [facility] Progress Note    Subjective:   Events of last 24 hours noted.  Pt is delirious and agitated with pressured speech.   Objective:   BP 149/85 mmHg  Pulse 140  Temp(Src) 98.9 F (37.2 C) (Oral)  Resp 18  Wt 82.1 kg (181 lb)  SpO2 98%  LMP 01/21/2015  Intake/Output:     Intake/Output this shift:    Weight change:   Physical Exam: Gen:WD obese WF agitated, flushed CVS: tachy Resp:cta [id number] Ext:+ edema around left thigh AVG +T/B, some serous drainage from previous needle site.  Labs: BMET  Recent Labs Lab 02/12/15 2329 02/14/15 1615 02/16/15 1913 02/18/15 1615  NA 136 138 137 135  K 3.9 3.9 3.4* 3.2*  CL 99* 102 100* 100*  CO2 29 31 28 26   GLUCOSE 75 82 73 93  BUN 17 12 9 9   CREATININE 6.68* 5.16* 5.24* 5.06*  ALBUMIN 2.1* 2.1* 2.0* 1.9*  CALCIUM 8.6* 9.0 8.9 8.5*  PHOS 4.0 2.9 3.0 2.9   CBC  Recent Labs Lab 02/12/15 2329 02/14/15 1615 02/16/15 1912 02/18/15 1615  WBC 5.6 5.7 5.8 5.6  HGB 8.2* 8.1* 9.5* 9.0*  HCT 26.6* 28.0* 32.3* 30.5*  MCV 95.0 97.2 97.3 96.5  PLT 193 185 192 140*    @IMGRELPRIORS @ Medications:    . atorvastatin  20 mg Oral q1800  . [START ON 02/21/2015] Darbepoetin Alfa  200 mcg Intravenous Q Thu-HD  . hydroxychloroquine  400 mg Oral Daily  . metoprolol succinate  25 mg Oral QHS  . multivitamin  1 tablet Oral QHS  . pantoprazole  20 mg Oral Daily  . sodium chloride  3 mL Intravenous Q12H  . warfarin  5 mg Oral q1800  . Warfarin - Pharmacist Dosing Inpatient   Does not apply q1800   Dialysis Orders: [facility] TTS 4 hr 400/800 EDW 87.5 2 K 2 Ca no profile right thigh TDC and left thigh graft (placed 10/7) Aranesp 200/week heparin 2600 no labs no Fe or VDRA NO labs - hx prior steal of left arm graft s/p ligation 12/19/14  Assessment/ Plan:   1. Low grade Fever and AMS- possible delirium due to infection.  Agree with  pan cultures and empiric antibiotics 2. Auditory hallucinations- ?psychosis from delirium or drug induced.  Pt mentioned "nerve pill" yesterday when she was lucid but nothing on MAR.  Need to investigate.  Also need to r/o lupus cerebritis and consider LP. 3. Pseudomonas catheter-related sepsis- completed fortaz 02/09/15, cath removed 4. ESRD cont with TTS.  Will hold off on HD today due to inpatient hospitalization (she was supposed to have been discharged today after HD) 5. 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 Nutrition: per primary 8. Hypotension: chronic, on midodrine 10mg  tid on HD days 9. 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 Vascular access- using left thigh AVG 11. SLE- on prednisone 12. Dispo- discharge to home on hold due to AMS/fever/delirium and inpatient hospitalization.    [name], MD [place] Pager [phone] 02/19/2015, 11:58 AM

## 2015-02-19 NOTE — Progress Notes (Signed)
Subjective/Complaints: Pt doesn't remember me, has been talking to "someone" in the corner who tells her what to do.  ROS:  Denies CP, SOB, N/V/D  Objective: Vital Signs: Blood pressure 166/82, pulse 154, temperature 100.4 F (38 C), temperature source Rectal, resp. rate 15, weight 82.6 kg (182 lb 1.6 oz), last menstrual period 01/21/2015, SpO2 100 %. Ct Head Wo Contrast  02/19/2015  CLINICAL DATA:  Acute onset of severe confusion. Auditory and visual hallucinations for more than 12 hours. Initial encounter. EXAM: CT HEAD WITHOUT CONTRAST TECHNIQUE: Contiguous axial images were obtained from the base of the skull through the vertex without intravenous contrast. COMPARISON:  CT of the head, and MRI of the brain performed 01/25/2015 FINDINGS: There is no evidence of acute infarction, mass lesion, or intra- or extra-axial hemorrhage on CT. Prominence of the ventricles suggests mild cortical volume loss. Mild cerebellar atrophy is noted. An evolving relatively chronic right anterior MCA territory infarct is again noted, predominantly at the right frontoparietal region. The brainstem and fourth ventricle are within normal limits. The basal ganglia are unremarkable in appearance. The cerebral hemispheres demonstrate grossly normal gray-white differentiation. No mass effect or midline shift is seen. There is no evidence of fracture; visualized osseous structures are unremarkable in appearance. The visualized portions of the orbits are within normal limits. A mucus retention cyst or polyp is noted at the right side of the sphenoid sinus. The remaining paranasal sinuses and mastoid air cells are well-aerated. No significant soft tissue abnormalities are seen. IMPRESSION: 1. No acute intracranial pathology seen on CT. 2. 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 3. Mild cortical volume loss noted. 4. Mucus retention cyst or  polyp at the right side of the sphenoid sinus. Electronically Signed   By: Garald Balding M.D.   On: 02/19/2015 06:57   Results for orders placed or performed during the hospital encounter of 02/04/15 (from the past 72 hour(s))  CBC     Status: Abnormal   Collection Time: 02/16/15  7:12 PM  Result Value Ref Range   WBC 5.8 4.0 - 10.5 K/uL   RBC 3.32 (L) 3.87 - 5.11 MIL/uL   Hemoglobin 9.5 (L) 12.0 - 15.0 g/dL   HCT 32.3 (L) 36.0 - 46.0 %   MCV 97.3 78.0 - 100.0 fL   MCH 28.6 26.0 - 34.0 pg   MCHC 29.4 (L) 30.0 - 36.0 g/dL   RDW 17.7 (H) 11.5 - 15.5 %   Platelets 192 150 - 400 K/uL  Renal function panel     Status: Abnormal   Collection Time: 02/16/15  7:13 PM  Result Value Ref Range   Sodium 137 135 - 145 mmol/L   Potassium 3.4 (L) 3.5 - 5.1 mmol/L   Chloride 100 (L) 101 - 111 mmol/L   CO2 28 22 - 32 mmol/L   Glucose, Bld 73 65 - 99 mg/dL   BUN 9 6 - 20 mg/dL   Creatinine, Ser 5.24 (H) 0.44 - 1.00 mg/dL   Calcium 8.9 8.9 - 10.3 mg/dL   Phosphorus 3.0 2.5 - 4.6 mg/dL   Albumin 2.0 (L) 3.5 - 5.0 g/dL   GFR calc non Af Amer 9 (L) >60 mL/min   GFR calc Af Amer 11 (L) >60 mL/min    Comment: (NOTE) The eGFR has been calculated using the CKD EPI equation. This calculation has not been validated in all clinical situations. eGFR's persistently <60 mL/min signify possible Chronic  Kidney Disease.    Anion gap 9 5 - 15  Protime-INR     Status: Abnormal   Collection Time: 02/17/15  4:07 AM  Result Value Ref Range   Prothrombin Time 21.6 (H) 11.6 - 15.2 seconds   INR 1.89 (H) 0.00 - 1.49  Protime-INR     Status: Abnormal   Collection Time: 02/18/15  6:14 AM  Result Value Ref Range   Prothrombin Time 24.9 (H) 11.6 - 15.2 seconds   INR 2.28 (H) 0.00 - 1.49  CBC     Status: Abnormal   Collection Time: 02/18/15  4:15 PM  Result Value Ref Range   WBC 5.6 4.0 - 10.5 K/uL   RBC 3.16 (L) 3.87 - 5.11 MIL/uL   Hemoglobin 9.0 (L) 12.0 - 15.0 g/dL   HCT 30.5 (L) 36.0 - 46.0 %   MCV 96.5  78.0 - 100.0 fL   MCH 28.5 26.0 - 34.0 pg   MCHC 29.5 (L) 30.0 - 36.0 g/dL   RDW 17.3 (H) 11.5 - 15.5 %   Platelets 140 (L) 150 - 400 K/uL  Renal function panel     Status: Abnormal   Collection Time: 02/18/15  4:15 PM  Result Value Ref Range   Sodium 135 135 - 145 mmol/L   Potassium 3.2 (L) 3.5 - 5.1 mmol/L   Chloride 100 (L) 101 - 111 mmol/L   CO2 26 22 - 32 mmol/L   Glucose, Bld 93 65 - 99 mg/dL   BUN 9 6 - 20 mg/dL   Creatinine, Ser 5.06 (H) 0.44 - 1.00 mg/dL   Calcium 8.5 (L) 8.9 - 10.3 mg/dL   Phosphorus 2.9 2.5 - 4.6 mg/dL   Albumin 1.9 (L) 3.5 - 5.0 g/dL   GFR calc non Af Amer 10 (L) >60 mL/min   GFR calc Af Amer 11 (L) >60 mL/min    Comment: (NOTE) The eGFR has been calculated using the CKD EPI equation. This calculation has not been validated in all clinical situations. eGFR's persistently <60 mL/min signify possible Chronic Kidney Disease.    Anion gap 9 5 - 15  Glucose, capillary     Status: None   Collection Time: 02/19/15  2:33 AM  Result Value Ref Range   Glucose-Capillary 72 65 - 99 mg/dL    Gen NAD. Vital signs reviewed. HEENT: Normocephalic, atraumatic Cardio: RRR. +Murmur Resp: CTA B/L and unlabored GI: BS positive and NT, ND Skin:   Wound Left thigh swollen aroung graft, dressing clean dry and intact Neuro: Alert/Oriented, Normal Sensory Motor 4/5 Left delt, bi, tri, grip, 2+ L hip flex, 3 ankle DF/PF Musc/Skel:  no tenderness, L thigh swelling   Assessment/Plan: 1. Functional deficits secondary to right basal ganglia infarct with increased LLE weakness and debility from recent bacteremia   Acute onset of delirium, psychosis accompanied by low grade fever and tachycardia DDx is broad but includes recurrent bacteremia/sepsis, recurrent CVA (although CT head shows no new lesion), lupus cerebritis. 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, will ask them to eval for transfer as pt cannot  participate in rehab and require further medical work up and monitoring FIM: Function - Bathing Bathing activity did not occur: Refused Position: Wheelchair/chair at sink Body parts bathed by patient: Right arm, Left arm, Chest, Abdomen, Front perineal area, Buttocks, Right upper leg, Left upper leg, Right lower leg, Left lower leg Body parts bathed by helper: Back, Buttocks Bathing not applicable: Left  lower leg, Right lower leg, Right upper leg, Left upper leg Assist Level: Set up Set up : To obtain items  Function- Upper Body Dressing/Undressing Upper body dressing/undressing activity did not occur: Refused What is the patient wearing?: Paxton over shirt/dress - Perfomed by patient: Thread/unthread right sleeve, Thread/unthread left sleeve, Put head through opening, Pull shirt over trunk Assist Level: Set up Set up : To obtain clothing/put away Function - Lower Body Dressing/Undressing What is the patient wearing?: Pants, Non-skid slipper socks Position: Wheelchair/chair at sink Underwear - Performed by helper: Thread/unthread right underwear leg, Thread/unthread left underwear leg, Pull underwear up/down Pants- Performed by patient: Thread/unthread right pants leg, Thread/unthread left pants leg, Pull pants up/down (used reacher) Non-skid slipper socks- Performed by patient: Don/doff right sock, Don/doff left sock (with sock aid) Non-skid slipper socks- Performed by helper: Don/doff right sock, Don/doff left sock TED Hose - Performed by helper: Don/doff left TED hose Assist for footwear: Setup Assist for lower body dressing: Touching or steadying assistance (Pt > 75%)  Function - Toileting Toileting activity did not occur: N/A Toileting steps completed by patient: Adjust clothing prior to toileting, Performs perineal hygiene, Adjust clothing after toileting Toileting steps completed by helper: Adjust clothing prior to toileting, Performs perineal hygiene, Adjust  clothing after toileting Toileting Assistive Devices: Grab bar or rail Assist level: Supervision or verbal cues  Function - Air cabin crew transfer activity did not occur: N/A Toilet transfer assistive device: Elevated toilet seat/BSC over toilet, Walker Assist level to toilet: Supervision or verbal cues Assist level from toilet: Supervision or verbal cues Assist level to bedside commode (at bedside): Moderate assist (Pt 50 - 74%/lift or lower) Assist level from bedside commode (at bedside): Moderate assist (Pt 50 - 74%/lift or lower)  Function - Chair/bed transfer Chair/bed transfer method: Stand pivot Chair/bed transfer assist level: Supervision or verbal cues Chair/bed transfer assistive device: Armrests Chair/bed transfer details: Verbal cues for technique  Function - Locomotion: Wheelchair Will patient use wheelchair at discharge?: Yes Type: Manual Max wheelchair distance: 75 Assist Level: Supervision or verbal cues Wheel 50 feet with 2 turns activity did not occur: Safety/medical concerns Assist Level: Supervision or verbal cues Wheel 150 feet activity did not occur: Safety/medical concerns Assist Level: Dependent (Pt equals 0%) Turns around,maneuvers to table,bed, and toilet,negotiates 3% grade,maneuvers on rugs and over doorsills: No Function - Locomotion: Ambulation Assistive device: Other (comment) (Rollator) Max distance: 50 Assist level: Supervision or verbal cues Assist level: Supervision or verbal cues Walk 50 feet with 2 turns activity did not occur: Safety/medical concerns Assist level: Supervision or verbal cues Walk 150 feet activity did not occur: Safety/medical concerns Walk 10 feet on uneven surfaces activity did not occur: Refused Assist level: Touching or steadying assistance (Pt > 75%)  Function - Comprehension Comprehension: Auditory Comprehension assist level: Understands complex 90% of the time/cues 10% of the time  Function -  Expression Expression: Verbal Expression assist level: Expresses basic needs/ideas: With extra time/assistive device  Function - Social Interaction Social Interaction assist level: Interacts appropriately 90% of the time - Needs monitoring or encouragement for participation or interaction.  Function - Problem Solving Problem solving assist level: Solves basic 50 - 74% of the time/requires cueing 25 - 49% of the time  Function - Memory Memory assist level: Recognizes or recalls 50 - 74% of the time/requires cueing 25 - 49% of the time Patient normally able to recall (first 3 days only): Current season, Staff names and faces, That he or  she is in a hospital, Location of own room  Medical Problem List and Plan: 1. Functional deficits secondary to right basal ganglia infarct and debility from recent bacteremia.  2.  DVT Prophylaxis/Anticoagulation: Chronic Coumadin resumed, per Dr Beryle Beams, start 30m and titrate up slowly, blood draws difficult, will do 3 times a week INR in HD I discussed this complex case with Dr. GEinar Gipfrom cardiology as well as Dr. GBeryle Beamsfrom hematology. Consensus is to do anticoagulation.Hematology recommends no loading dose for warfarin. Start at 5 mg and titrate to INR between 2 and 3   - INR2.28  today 3. Pain Management: Oxycodone and Flexeril as needed.  4. ID/Pseudomonas sepsis. Completed Fortaz 02/09/2015,low grade temp, ?  5. Neuropsych: This patient is capable of making decisions on her own behalf. 6. Skin/Wound Care: Routine skin checks 7. Fluids/Electrolytes/Nutrition: Routine I/Os with follow-up chemistries 8. End-stage renal disease/lupus nephritis. Continue hemodialysis as per renal services, uses new Left femoral graft for HD, Nephro to address pt's HD/TRansplant questions         9. Orthostatic hypotension. Midodrine 10 mg twice a day on dialysis days. 10. Chronic anemia. Continue Aranesp  - Hb 9.0 on 11/21, improved from previous.   11.  Hyperlipidemia. Lipitor 12.  Mood and affect depression screen is positive,started low dose celexa,  13.  Lupus pleuritis- resolved, off prednisone   LOS (Days) 15   A FACE TO FACE EVALUATION WAS PERFORMED  Oliverio Cho E 02/19/2015, 7:00 AM

## 2015-02-19 NOTE — Progress Notes (Signed)
Report was call to [facility] on 3 E.Pt. Is ready to be transfer.Pt.'s family has been notified about pt's transfer.

## 2015-02-19 NOTE — H&P (Deleted)
Date: 02/19/2015               Patient Name:  [name] MRN: [id number]  DOB: 1975-06-03 Age / Sex: 39 y.o., female   PCP: Lin Landsman, MD         Medical Service: Internal Medicine Teaching Service         Attending Physician: Dr. Axel Filler, MD    First Contact: Dr. Benjamine Mola Pager: [phone]  Second Contact: Dr. Arcelia Jew Pager: [phone]       After Hours (After 5p/  First Contact Pager: [phone]  weekends / holidays): Second Contact Pager: [phone]   Chief Complaint: Auditory Hallucinations  History of Present Illness: [name] is a 39 y.o. 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 Per the rehab physician and nursing staff, the patient started stating that there was someone else in the room telling her what to do. This has been going on for the past 24 hours or so and seems to be getting worse. This is also accompanied by tachycardia and low grade fever. On exam, the patient states that her "ex-boyfriend's wife, [name]" is hiding under the bed speaking to her. She is reportedly telling her the healthcare professionals caring for her are lying to her. Otherwise, the patient does not report any other significant symptoms. She says she feels anxious and depressed, has some mild intermittent chest pain and generalized weakness, but states this is not new. She is able to answer questions appropriately, is alert and oriented x2 (could not state the year), but knows that Thanksgiving is next week. Other than auditory hallucinations, her cognition and speech are intact.  Patient has had a very complex history recently with prolonged hospital admission, HD catheter infection, pseudomonas UTI, recent stroke, and murantic endocarditis. Per CIR physician, patient was supposed to go home today given her significant improvement in rehab, however, now she has significant  change in mental status.    CT head performed in CIR this AM shows no acute abnormality, only chronic, right frontal CVA, reported as evolved since previous CT head. EKG shows sinus tachycardia, lactic acid 2.8. Temp this AM 100.4, Pulse 154, BP elevated to 166/82.    Meds: Current Facility-Administered Medications  Medication Dose Route Frequency Provider Last Rate Last Dose  . 0.9 %  sodium chloride infusion  100 mL Intravenous PRN Roney Jaffe, MD      . 0.9 %  sodium chloride infusion  100 mL Intravenous PRN Roney Jaffe, MD      . 0.9 %  sodium chloride infusion  100 mL Intravenous PRN Roney Jaffe, MD      . acetaminophen (TYLENOL) tablet 650 mg  650 mg Oral Q6H PRN Lavon Paganini Angiulli, PA-C       Or  . acetaminophen (TYLENOL) suppository 650 mg  650 mg Rectal Q6H PRN Lavon Paganini Angiulli, PA-C      . alteplase (CATHFLO ACTIVASE) injection 2 mg  2 mg Intracatheter Once PRN Roney Jaffe, MD      . atorvastatin (LIPITOR) tablet 20 mg  20 mg Oral q1800 Lavon Paganini Angiulli, PA-C   20 mg at 02/17/15 1706  . calcium carbonate (TUMS - dosed in mg elemental calcium) chewable tablet 200-400 mg of elemental calcium  1-2 tablet Oral QID PRN Charlett Blake, MD   400 mg of elemental calcium at 02/17/15 0846  . citalopram (  CELEXA) tablet 10 mg  10 mg Oral Daily Charlett Blake, MD   10 mg at 02/19/15 0806  . cyclobenzaprine (FLEXERIL) tablet 10 mg  10 mg Oral QHS PRN Lavon Paganini Angiulli, PA-C      . Darbepoetin Alfa (ARANESP) injection 200 mcg  200 mcg Intravenous Q Thu-HD Daniel J Angiulli, PA-C   200 mcg at 02/14/15 1833  . feeding supplement (PRO-STAT SUGAR FREE 64) liquid 30 mL  30 mL Oral BID Daniel J Angiulli, PA-C      . heparin injection 1,000 Units  1,000 Units Dialysis PRN Roney Jaffe, MD      . heparin injection 1,700 Units  20 Units/kg Dialysis PRN Donato Heinz, MD      . heparin injection 2,600 Units  2,600 Units Dialysis Once in dialysis Roney Jaffe, MD      . heparin  injection 3,600 Units  40 Units/kg Dialysis PRN Elmarie Shiley, MD      . hydroxychloroquine (PLAQUENIL) tablet 400 mg  400 mg Oral Daily Lavon Paganini Angiulli, PA-C   400 mg at 02/11/15 5643  . lidocaine (PF) (XYLOCAINE) 1 % injection 5 mL  5 mL Intradermal PRN Roney Jaffe, MD      . lidocaine-prilocaine (EMLA) cream 1 application  1 application Topical PRN Roney Jaffe, MD      . loperamide (IMODIUM) capsule 2 mg  2 mg Oral Q2H PRN Lavon Paganini Angiulli, PA-C   2 mg at 02/11/15 0911  . metoprolol succinate (TOPROL-XL) 24 hr tablet 25 mg  25 mg Oral QHS Daniel J Angiulli, PA-C   25 mg at 02/18/15 2100  . midodrine (PROAMATINE) tablet 10 mg  10 mg Oral 2 times per day on Tue Thu Sat Charlett Blake, MD   10 mg at 02/19/15 3295  . midodrine (PROAMATINE) tablet 10 mg  10 mg Oral 2 times per day on Sun Mon Wed Fri Charlett Blake, MD   10 mg at 02/18/15 1657  . multivitamin (RENA-VIT) tablet 1 tablet  1 tablet Oral QHS Lavon Paganini Angiulli, PA-C   1 tablet at 02/18/15 2100  . ondansetron (ZOFRAN) tablet 4 mg  4 mg Oral Q6H PRN Lavon Paganini Angiulli, PA-C   4 mg at 02/17/15 0846   Or  . ondansetron (ZOFRAN) injection 4 mg  4 mg Intravenous Q6H PRN Lavon Paganini Angiulli, PA-C   4 mg at 02/07/15 1213  . oxyCODONE-acetaminophen (PERCOCET/ROXICET) 5-325 MG per tablet 1 tablet  1 tablet Oral Q6H PRN Cathlyn Parsons, PA-C   1 tablet at 02/17/15 2154  . pantoprazole (PROTONIX) EC tablet 40 mg  40 mg Oral Daily Lavon Paganini Angiulli, PA-C   40 mg at 02/19/15 0806  . pentafluoroprop-tetrafluoroeth (GEBAUERS) aerosol 1 application  1 application Topical PRN Roney Jaffe, MD      . sevelamer carbonate (RENVELA) packet 1.6 g  1.6 g Oral TID WC Charlett Blake, MD   0.8 g at 02/18/15 2059  . sorbitol 70 % solution 30 mL  30 mL Oral Daily PRN Lavon Paganini Angiulli, PA-C      . warfarin (COUMADIN) tablet 5 mg  5 mg Oral q1800 Leodis Sias, RPH   5 mg at 02/18/15 2100  . Warfarin - Pharmacist Dosing Inpatient   Does not apply [place], MD        Allergies: Allergies as of 02/04/2015 - Review Complete 02/04/2015  Allergen Reaction Noted  . Food Swelling 08/29/2014   Past  Medical History  Diagnosis Date  . Hypertension   . Cardiac arrhythmia   . SVT (supraventricular tachycardia) ([place])     "today, last week, 2 wk ago; maybe 1 month ago, etc; started w/in last 3-4 yrs"(07/20/2012)  . Fainting     "~ 1 month ago; probably related to SVT" (07/20/2012)  . Shortness of breath     "related to SVT episodes" (07/20/2012)  . Chest pain at rest     "related to SVT" (07/20/2012)  . Lupus (systemic lupus erythematosus) ([place])   . GERD (gastroesophageal reflux disease)   . Fibromyalgia   . Irritable bowel syndrome (IBS)   . Hypercholesterolemia   . Heart murmur     "small" (12/25/2014)  . TIA (transient ischemic attack) 12/21/2014  . Sleep apnea     "don't wear mask anymore" (12/25/2014)  . Anemia   . History of blood transfusion "several"    "related to low counts"  . Daily headache   . Arthritis     "hips" (12/25/2014)  . Anxiety     "sometimes; don't take anything for it" (12/25/2014)  . ESRD (end stage renal disease) on dialysis ([place]) since 12/07/2014    Diagnosed Aug 2014 w SLE nephritis DPGN by biopsy, rx cellcept/ steroids.  Repeat biopsy early 2016 membranous w/o activity so meds weaned off. Big flare Aug '16 creat 6, 3rd biopsy DPGN, meds resumed. Ended up starting HD Sept 2016  . Stroke ([place])   . History of vascular access device     2016: Sept 9  R IJ cath per IR.  Sept 20 not candidate for fistula due to disease veins, had LUA hybrid graft placed. Sept 21 - steal syndrome, LUA AVG ligated. Oct 7 - new left femoral Gore-tex loop graft per [facility]VS. Oct 29 - removal R IJ tunneled HD cath   Past Surgical History  Procedure Laterality Date  . Cesarean section  2001; 2010  . Cervical biopsy  2013  . Supraventricular tachycardia ablation  07/21/12     Dr. Cristopher Peru   . Peripheral vascular  catheterization N/A 12/14/2014    Procedure: Upper Extremity Venography;  Surgeon: Elam Dutch, MD;  Location: [facility];  Service: Cardiovascular;  Laterality: N/A;  . Insertion hybrid anteriovenous gortex graft Left 12/18/2014    Procedure: INSERTION GORE HYBRID ARTERIOVENOUS GRAFT LEFT AXILLO-BRACHIAL.;  Surgeon: Serafina Mitchell, MD;  Location: [facility] OR;  Service: Vascular;  Laterality: Left;  . Ligation of arteriovenous  fistula Left 12/19/2014    Procedure: LIGATION OF FISTULA;  Surgeon: Elam Dutch, MD;  Location: [facility];  Service: Vascular;  Laterality: Left;  . Appendectomy  2011  . Tubal ligation  2010  . Tee without cardioversion N/A 12/25/2014    Procedure: TRANSESOPHAGEAL ECHOCARDIOGRAM (TEE);  Surgeon: Sueanne Margarita, MD;  Location: [facility];  Service: Cardiovascular;  Laterality: N/A;  . Av fistula placement Left 01/04/2015    Procedure: INSERTION OF ARTERIOVENOUS (AV) GORE-TEX GRAFT THIGH;  Surgeon: Rosetta Posner, MD;  Location: [facility];  Service: Vascular;  Laterality: Left;  . Tee without cardioversion N/A 01/30/2015    Procedure: TRANSESOPHAGEAL ECHOCARDIOGRAM (TEE);  Surgeon: Lelon Perla, MD;  Location: [facility] ENDOSCOPY;  Service: Cardiovascular;  Laterality: N/A;   Family History  Problem Relation Age of Onset  . Cancer Mother     breast and ovarian  . BRCA 1/2 Sister    Social History   Social History  . Marital Status: Single  Spouse Name: N/A  . Number of Children: N/A  . Years of Education: N/A   Occupational History  . Not on file.   Social History Main Topics  . Smoking status: Never Smoker   . Smokeless tobacco: Never Used  . Alcohol Use: No     Comment: 07/20/2012 "might have a beer once/yr"  . Drug Use: No  . Sexual Activity: Not Currently    Birth Control/ Protection: None   Other Topics Concern  . Not on file   Social History Narrative    Review of Systems:  General: Positive for fever and fatigue. Denies diaphoresis,  appetite change.  Respiratory: Denies SOB, cough, and wheezing.   Cardiovascular: Positive for intermittent chest pain and palpitations.  Gastrointestinal: Positive for nausea. Denies vomiting, abdominal pain, and diarrhea Musculoskeletal: Denies myalgias, arthralgias, back pain, and gait problem.  Neurological: Positive for generalized weakness. Denies dizziness, syncope, lightheadedness, and headaches.  Psychiatric/Behavioral: Positive for mood changes. Denies sleep disturbance, and agitation.   Physical Exam: Blood pressure 166/82, pulse 154, temperature 100.4 F (38 C), temperature source Rectal, resp. rate 15, weight 177 lb 14.6 oz (80.7 kg), last menstrual period 01/21/2015, SpO2 100 %.  General: Morbidly obese AA female, alert, cooperative, NAD. HEENT: PERRL, EOMI. Moist mucus membranes. Neck: Full range of motion without pain, supple, no lymphadenopathy or carotid bruits Lungs: Clear to ascultation bilaterally, normal work of respiration, no wheezes, rales, rhonchi Heart: Quite tachycardic, unable to appreciate a murmur, gallops, or rub based on HR.  Abdomen: Soft, non-tender, non-distended, BS + Extremities: No cyanosis or clubbing. LLE with +1 pitting edema. Left femoral AVG with thrill/bruit.  Neurologic: Alert & oriented x2, cranial nerves II-XII intact, 4/5 strength in the right upper and lower extremities (chronic), sensation intact to light touch. Patient reports auditory hallucinations. Does not report "seeing" anyone else in the room but could not specify. Otherwise, cognition and speech intact. Able to identify several 4-legged animals, state how an apple and an orange are similar.    Lab results: Basic Metabolic Panel:  Recent Labs  02/16/15 1913 02/18/15 1615  NA 137 135  K 3.4* 3.2*  CL 100* 100*  CO2 28 26  GLUCOSE 73 93  BUN 9 9  CREATININE 5.24* 5.06*  CALCIUM 8.9 8.5*  PHOS 3.0 2.9   Liver Function Tests:  Recent Labs  02/16/15 1913 02/18/15 1615    ALBUMIN 2.0* 1.9*   CBC:  Recent Labs  02/16/15 1912 02/18/15 1615  WBC 5.8 5.6  HGB 9.5* 9.0*  HCT 32.3* 30.5*  MCV 97.3 96.5  PLT 192 140*   CBG:  Recent Labs  02/19/15 0233  GLUCAP 72   Coagulation:  Recent Labs  02/18/15 0614 02/19/15 0705  LABPROT 24.9* 26.5*  INR 2.28* 2.47*   Urine Drug Screen: Drugs of Abuse     Component Value Date/Time   LABOPIA NONE DETECTED 07/21/2012 0215   COCAINSCRNUR NONE DETECTED 07/21/2012 0215   LABBENZ NONE DETECTED 07/21/2012 0215   AMPHETMU NONE DETECTED 07/21/2012 0215   THCU NONE DETECTED 07/21/2012 0215   LABBARB NONE DETECTED 07/21/2012 0215     Imaging results:  Ct Head Wo Contrast  02/19/2015  CLINICAL DATA:  Acute onset of severe confusion. Auditory and visual hallucinations for more than 12 hours. Initial encounter. EXAM: CT HEAD WITHOUT CONTRAST TECHNIQUE: Contiguous axial images were obtained from the base of the skull through the vertex without intravenous contrast. COMPARISON:  CT of the head, and MRI of the brain  performed 01/25/2015 FINDINGS: There is no evidence of acute infarction, mass lesion, or intra- or extra-axial hemorrhage on CT. Prominence of the ventricles suggests mild cortical volume loss. Mild cerebellar atrophy is noted. An evolving relatively chronic right anterior MCA territory infarct is again noted, predominantly at the right frontoparietal region. The brainstem and fourth ventricle are within normal limits. The basal ganglia are unremarkable in appearance. The cerebral hemispheres demonstrate grossly normal gray-white differentiation. No mass effect or midline shift is seen. There is no evidence of fracture; visualized osseous structures are unremarkable in appearance. The visualized portions of the orbits are within normal limits. A mucus retention cyst or polyp is noted at the right side of the sphenoid sinus. The remaining paranasal sinuses and mastoid air cells are well-aerated. No significant  soft tissue abnormalities are seen. IMPRESSION: 1. No acute intracranial pathology seen on CT. 2. 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 3. Mild cortical volume loss noted. 4. Mucus retention cyst or polyp at the right side of the sphenoid sinus. Electronically Signed   By: Garald Balding M.D.   On: 02/19/2015 06:57    Other results: EKG: Sinus tachycardia, rate 149. LVH.   Assessment & Plan by Problem: [name] is a 39 y.o. 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  Auditory/Psychic Hallucinations: Suspect this is a manifestation of acute delirium, started over the past 24 hours. Patient states there is a girl named [name] hiding under her bed telling her things. She otherwise can identify other people in the room. This imaginary person is talking to her during the examination, telling her that people are lying to her. Otherwise the patient has relatively normal cognition, memory, and speech. She is able to identify objects, could state that Thanksgiving was coming, could name several 4-legged animals, exhibited normal word repetition, and memory. She follows commands and her neurological examination is otherwise only significant for some mild left-sided weakness which is expected given herr recent R. MCA CVA. Patient also has a mild fever and tachycardia. Sepsis is the top differential given her recent admission for pseumodonas bacteremia for which she completed a prolonged course of Ceftazidime earlier this month. Other possibilities include stroke, although there are no other significant neuro abnormalities on exam, CT head with no new findings. No sign of acute hemorrhage, obviously a concern given that she is on Coumadin for her recent embolic stroke. Epilepsy/seizures can  result in auditory hallucinations, although not typically complex such as voices, and unlikely to be accompanied by tachycardia and fever. PE unlikely given INR >2.0. 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 Unlikely to be 2/2 medications as she is no longer on antibiotics and has not been started on any new medications that could cause such findings. Lastly, her BP is elevated for her this AM, as high as 384'Y systolic, may be hypertensive encephalopathy, however, it seems these symptoms started prior to elevated BP, more likely to be related to agitation and anxiety. Do not suspect hepatic encephalopathy given no history of liver disease, not typical presentation.  -Admit to telemetry -Cultures; blood, urine -CXR -CBC, CMP -Hold ABx for now until cultures drawn and possible source of infection identified, unless patient's clinical status worsens.  -Continue sitter at bedside -C3, C4, total complement levels -Hold Midodrine for now given elevated BP -Can  consider Haldol prn for agitation if necessary. Would hold off on further benzo use for anxiety/agitation -Could consider EEG if no other etiology identified for AMS -HOLD Percocet -Tylenol prn for fever  SLE: Currently on Plaquenil, which she has been on since recent admission. Possibility that AMS is related to lupus? -Continue Plaquenil -Complement levels as above -HD per nephrology.   ESRD on HD: Stable. Do not suspect renal issues are contributing to AMS.  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 Still with residual left-sided weakness.  -Continue Coumadin -Lipitor  H/o SVT/Tachycardia: Unclear history, has apparently had an ablation in 2014, Follows with Dr. Einar Gip. Suspect this is the reason for her Toprol-XL. Has baseline tachycardia, now in the 150's.  -Continue Toprol-XL   H/o Hypotension: Currently hypertensive,  most likely related to anxiety.  -Hold Midodrine for now, restart if BP decreases  DVT/PE PPx: Coumadin  Dispo: Disposition is deferred at this time, awaiting improvement of current medical problems. Anticipated discharge in approximately 2-3 day(s).   The patient does have a current PCP Lin Landsman, MD) and does need an [facility] hospital follow-up appointment after discharge.  The patient does not have transportation limitations that hinder transportation to clinic appointments.  Signed: Corky Sox, MD 02/19/2015, 9:18 AM

## 2015-02-19 NOTE — Progress Notes (Deleted)
ANTICOAGULATION CONSULT NOTE - Initial Consult  Pharmacy Consult for coumadin  Indication: hx of SLE and hypercoagulable state  Allergies  Allergen Reactions  . Food Swelling    Red peppers    Patient Measurements: Weight: 177 lb 14.6 oz (80.7 kg)   Vital Signs: Temp: 98.9 F (37.2 C) (11/22 1103) Temp Source: Oral (11/22 1103) BP: 149/85 mmHg (11/22 1103) Pulse Rate: 140 (11/22 1103)  Labs:  Recent Labs  02/16/15 1912 02/16/15 1913 02/17/15 0407 02/18/15 0614 02/18/15 1615 02/19/15 0705  HGB 9.5*  --   --   --  9.0*  --   HCT 32.3*  --   --   --  30.5*  --   PLT 192  --   --   --  140*  --   LABPROT  --   --  21.6* 24.9*  --  26.5*  INR  --   --  1.89* 2.28*  --  2.47*  CREATININE  --  5.24*  --   --  5.06*  --     Estimated Creatinine Clearance: 15 mL/min (by C-G formula based on Cr of 5.06).   Medical History: Past Medical History  Diagnosis Date  . Hypertension   . Cardiac arrhythmia   . SVT (supraventricular tachycardia) ([place])     "today, last week, 2 wk ago; maybe 1 month ago, etc; started w/in last 3-4 yrs"(07/20/2012)  . Fainting     "~ 1 month ago; probably related to SVT" (07/20/2012)  . Shortness of breath     "related to SVT episodes" (07/20/2012)  . Chest pain at rest     "related to SVT" (07/20/2012)  . Lupus (systemic lupus erythematosus) ([place])   . GERD (gastroesophageal reflux disease)   . Fibromyalgia   . Irritable bowel syndrome (IBS)   . Hypercholesterolemia   . Heart murmur     "small" (12/25/2014)  . TIA (transient ischemic attack) 12/21/2014  . Sleep apnea     "don't wear mask anymore" (12/25/2014)  . Anemia   . History of blood transfusion "several"    "related to low counts"  . Daily headache   . Arthritis     "hips" (12/25/2014)  . Anxiety     "sometimes; don't take anything for it" (12/25/2014)  . ESRD (end stage renal disease) on dialysis ([place]) since 12/07/2014    Diagnosed Aug 2014 w SLE nephritis DPGN by biopsy, rx cellcept/  steroids.  Repeat biopsy early 2016 membranous w/o activity so meds weaned off. Big flare Aug '16 creat 6, 3rd biopsy DPGN, meds resumed. Ended up starting HD Sept 2016  . Stroke ([place])   . History of vascular access device     2016: Sept 9  R IJ cath per IR.  Sept 20 not candidate for fistula due to disease veins, had LUA hybrid graft placed. Sept 21 - steal syndrome, LUA AVG ligated. Oct 7 - new left femoral Gore-tex loop graft per VVS. Oct 29 - removal R IJ tunneled HD cath     Assessment: 39 yo female transferred from rehab with confusion. She is on coumadin  for history of SLE, stroke and hypercoagulable state. Pharmacy has been consulted to dose coumadin.  -INR today is 2.47 -She has been on 5mg /day since 02/13/15  Goal of Therapy:  INR 2-3 Monitor platelets by anticoagulation protocol: Yes   Plan:  -Coumadin 5mg  po daily -Daily PT/INR for now  Hildred Laser, Pharm D 02/19/2015 11:41 AM

## 2015-02-19 NOTE — Progress Notes (Signed)
CRITICAL VALUE ALERT  Critical value received:  lactic acid  Date of notification:  02/19/15  Time of notification:  7:55  Critical value read back: yes  Nurse who received alert:  Rayetta Humphrey RN  MD notified (1st page):  [place] [facility]  Time of first page:  [phone]  MD notified (2nd page):  Time of second page:  Responding MD:  Mar[place]e Sh[place] [facility]  Time MD responded:  7:58

## 2015-02-19 NOTE — Progress Notes (Signed)
Utilization review completed.  L. J. Carole Deere RN, BSN, CM 

## 2015-02-20 ENCOUNTER — Inpatient Hospital Stay (HOSPITAL_COMMUNITY): Payer: Medicaid Other

## 2015-02-20 DIAGNOSIS — G9340 Encephalopathy, unspecified: Secondary | ICD-10-CM

## 2015-02-20 LAB — CBC WITH DIFFERENTIAL/PLATELET
BASOS ABS: 0 10*3/uL (ref 0.0–0.1)
BASOS PCT: 0 %
EOS ABS: 0 10*3/uL (ref 0.0–0.7)
EOS PCT: 0 %
HCT: 35.4 % — ABNORMAL LOW (ref 36.0–46.0)
Hemoglobin: 10.3 g/dL — ABNORMAL LOW (ref 12.0–15.0)
Lymphocytes Relative: 6 %
Lymphs Abs: 0.5 10*3/uL — ABNORMAL LOW (ref 0.7–4.0)
MCH: 28.6 pg (ref 26.0–34.0)
MCHC: 29.1 g/dL — ABNORMAL LOW (ref 30.0–36.0)
MCV: 98.3 fL (ref 78.0–100.0)
MONO ABS: 0.3 10*3/uL (ref 0.1–1.0)
Monocytes Relative: 4 %
Neutro Abs: 7.6 10*3/uL (ref 1.7–7.7)
Neutrophils Relative %: 90 %
PLATELETS: 156 10*3/uL (ref 150–400)
RBC: 3.6 MIL/uL — ABNORMAL LOW (ref 3.87–5.11)
RDW: 17.9 % — AB (ref 11.5–15.5)
WBC: 8.5 10*3/uL (ref 4.0–10.5)

## 2015-02-20 LAB — BASIC METABOLIC PANEL
Anion gap: 9 (ref 5–15)
BUN: 9 mg/dL (ref 6–20)
CALCIUM: 8.6 mg/dL — AB (ref 8.9–10.3)
CO2: 28 mmol/L (ref 22–32)
CREATININE: 4.68 mg/dL — AB (ref 0.44–1.00)
Chloride: 100 mmol/L — ABNORMAL LOW (ref 101–111)
GFR calc Af Amer: 13 mL/min — ABNORMAL LOW (ref >60)
GFR, EST NON AFRICAN AMERICAN: 11 mL/min — AB (ref >60)
GLUCOSE: 85 mg/dL (ref 65–99)
Potassium: 4.7 mmol/L (ref 3.5–5.1)
SODIUM: 137 mmol/L (ref 135–145)

## 2015-02-20 LAB — URINALYSIS, ROUTINE W REFLEX MICROSCOPIC
Glucose, UA: NEGATIVE mg/dL
KETONES UR: 15 mg/dL — AB
Nitrite: NEGATIVE
Protein, ur: >300 mg/dL — AB
SPECIFIC GRAVITY, URINE: 1.021 (ref 1.005–1.030)
pH: 6 (ref 5.0–8.0)

## 2015-02-20 LAB — URINE MICROSCOPIC-ADD ON

## 2015-02-20 LAB — PROTIME-INR
INR: 3.34 — AB (ref 0.00–1.49)
PROTHROMBIN TIME: 33.2 s — AB (ref 11.6–15.2)

## 2015-02-20 MED ORDER — PHYTONADIONE 5 MG PO TABS
10.0000 mg | ORAL_TABLET | Freq: Once | ORAL | Status: DC
  Filled 2015-02-20: qty 2

## 2015-02-20 MED ORDER — MIDODRINE HCL 5 MG PO TABS
10.0000 mg | ORAL_TABLET | Freq: Two times a day (BID) | ORAL | Status: DC
  Administered 2015-02-20 – 2015-03-11 (×30): 10 mg via ORAL
  Filled 2015-02-20 (×35): qty 2

## 2015-02-20 MED ORDER — QUETIAPINE FUMARATE 25 MG PO TABS
25.0000 mg | ORAL_TABLET | Freq: Once | ORAL | Status: AC
  Administered 2015-02-20: 25 mg via ORAL
  Filled 2015-02-20: qty 1

## 2015-02-20 MED ORDER — HALOPERIDOL LACTATE 5 MG/ML IJ SOLN
1.0000 mg | Freq: Once | INTRAMUSCULAR | Status: AC
  Administered 2015-02-20: 1 mg via INTRAVENOUS
  Filled 2015-02-20: qty 1

## 2015-02-20 MED ORDER — MIDODRINE HCL 5 MG PO TABS
10.0000 mg | ORAL_TABLET | Freq: Two times a day (BID) | ORAL | Status: DC
  Filled 2015-02-20: qty 2

## 2015-02-20 MED ORDER — HYDROXYCHLOROQUINE SULFATE 200 MG PO TABS
400.0000 mg | ORAL_TABLET | Freq: Every day | ORAL | Status: DC
  Administered 2015-02-21 – 2015-02-22 (×2): 400 mg via ORAL
  Filled 2015-02-20 (×2): qty 2

## 2015-02-20 MED ORDER — ACETAMINOPHEN 325 MG PO TABS
650.0000 mg | ORAL_TABLET | ORAL | Status: DC | PRN
  Administered 2015-02-20 – 2015-03-16 (×5): 650 mg via ORAL
  Filled 2015-02-20 (×9): qty 2

## 2015-02-20 MED ORDER — PANTOPRAZOLE SODIUM 20 MG PO TBEC
20.0000 mg | DELAYED_RELEASE_TABLET | Freq: Every day | ORAL | Status: DC
  Administered 2015-02-21 – 2015-02-22 (×2): 20 mg via ORAL
  Filled 2015-02-20 (×4): qty 1

## 2015-02-20 MED ORDER — VANCOMYCIN HCL IN DEXTROSE 1-5 GM/200ML-% IV SOLN
1000.0000 mg | INTRAVENOUS | Status: AC
  Administered 2015-02-20: 1000 mg via INTRAVENOUS
  Filled 2015-02-20: qty 200

## 2015-02-20 MED ORDER — PHYTONADIONE 5 MG PO TABS
10.0000 mg | ORAL_TABLET | Freq: Once | ORAL | Status: AC
  Administered 2015-02-20: 10 mg via ORAL
  Filled 2015-02-20: qty 2

## 2015-02-20 MED ORDER — HALOPERIDOL LACTATE 5 MG/ML IJ SOLN
1.0000 mg | Freq: Once | INTRAMUSCULAR | Status: AC
  Administered 2015-02-20: 1 mg via INTRAVENOUS

## 2015-02-20 NOTE — Progress Notes (Signed)
Pt transferred from [place] around 0330, admitted to Rm/3s09. She is alert and oriented x3 but very paranoid and talking to people not in the room. Redness and edema noted to Left thigh around graft site. Also has old healed would to inner Right buttock. Placed on telemetry, currently ST. Oriented to room, instructed to call for assistance before getting out of bed, bed alarm on. Resting comfortably at this time, will continue to monitor  Pt down to radiology for MRI around 0400, technician called saying pt was not cooperating with the test. MD notified, order received for PRN Haldol. Given at bedside in radiology, pt tolerated well

## 2015-02-20 NOTE — Progress Notes (Signed)
Patient ID: [name], female   DOB: Oct 05, 1975, 39 y.o.   MRN: [id number]  I was paged by Nurse Santiago Glad that [name] was febrile again to 101.5 and still tachycardic to the 140s with stable blood pressures. Unfortunately she was unable to get her brain MRI earlier because she was paranoid and hallucinating. When I evaluated her, she was talking to herself and was could not carry a logical conversation, but denied complaints. Her right upper leg was warm and erythematous over her graft site. She's currently on vancomycin and ceftazidime. I gave her another 25mg  Quetiapine to hopefully help her delirium so that she can get her brain MRI tonight. Nurse Santiago Glad will help coordinate the imaging. I also ordered Tylenol for her fever. I'll transfer her to step-down given her progression of what appears to be sepsis.  Loleta Chance, MD

## 2015-02-20 NOTE — Progress Notes (Signed)
Rehab admissions - Patient known to me from recent CIR admission.  Patient was set for discharge yesterday when she had AMS changes and transferred back to acute hospital.  Per Dr. Letta Pate and social worker, patient does not need to return to inpatient rehab.  Ready for discharge once attending MD feels patient is medically ready for discharge.  Please see social worker note from [place] on 02/19/15.  Call me for questions.  RC:[id number]

## 2015-02-20 NOTE — Care Management Note (Addendum)
Case Management Note  Patient Details  Name: [name] MRN: [id number] Date of Birth: July 06, 1975  Subjective/Objective:                 Admitted with AMS/ acute encephalopathy. Recently d/c to CIR ,hx of lupus nephritis/ ESRD,  left thigh AVG,  right MCA infarction, and endocarditis.    Action/Plan: Discharge to home with hh services when medically stable. CM to f/u with disposition needs.  Expected Discharge Date:                  Expected Discharge Plan:    In-House Referral:     Discharge planning Services  CM Consult  Post Acute Care Choice:    Choice offered to:     DME Arranged:   wheelchair DME Agency:   [place]:   RN,PT,OT [name]  Status of Service:  In process, will continue to follow  Medicare Important Message Given:    Date Medicare IM Given:    Medicare IM give by:    Date Additional Medicare IM Given:    Additional Medicare Important Message give by:     If discussed at [place] of Stay Meetings, dates discussed:    Additional Comments:  CM spoke with pt regarding discharge plan. Pt states lives with mom and children. Pt  active with [place] (RN,PT,OT).  Whitman Hero [place], [place] [phone] 02/20/2015, 12:22 PM
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
## 2015-02-20 NOTE — Progress Notes (Signed)
Pt. Remains confused at this time. HR 140-150s sustaining, unchanged after dose of Metoprolol. Pts. Temp 101.5. On call MD for Imts made aware. MD to come to floor to see pt. RN will continue to monitor pt. For changes in condition. Antwann Preziosi, Katherine Roan

## 2015-02-20 NOTE — Progress Notes (Signed)
Pt. Transferred to 3S09. Pts. Sister, Ala Bent, made aware. Shela Esses, Katherine Roan

## 2015-02-20 NOTE — Progress Notes (Signed)
Patient ID: [name], female   DOB: January 28, 1976, 39 y.o.   MRN: [id number]  [facility] Progress Note    Subjective:   More calm today but still delirious   Objective:   BP 113/85 mmHg  Pulse 144  Temp(Src) 98 F (36.7 C) (Axillary)  Resp 24  Ht 5\' 4"  (1.626 m)  Wt 82.8 kg (182 lb 8.7 oz)  BMI 31.32 kg/m2  SpO2 92%  LMP 01/21/2015  Intake/Output: I/O last 3 completed shifts: In: 1030 [P.O.:480; IV Piggyback:550] Out: 0    Intake/Output this shift:    Weight change:   Physical Exam: Gen:WD [name] resting comfortably [id number] no rub Resp:cta [id number] thigh AVG +T/B, +edema, no fluctuance or purulent drainage  Labs: BMET  Recent Labs Lab 02/14/15 1615 02/16/15 1913 02/18/15 1615 02/19/15 1255 02/20/15 0337  NA 138 137 135 139 137  K 3.9 3.4* 3.2* 4.0 4.7  CL 102 100* 100* 100* 100*  CO2 31 28 26 29 28   GLUCOSE 82 73 93 107* 85  BUN 12 9 9 6 9   CREATININE 5.16* 5.24* 5.06* 3.97* 4.68*  ALBUMIN 2.1* 2.0* 1.9* 2.3*  --   CALCIUM 9.0 8.9 8.5* 8.6* 8.6*  PHOS 2.9 3.0 2.9 2.5  --    CBC  Recent Labs Lab 02/18/15 1615 02/19/15 1255 02/19/15 1655 02/20/15 0337  WBC 5.6 7.4 9.1 8.5  NEUTROABS  --  6.2  --  7.6  HGB 9.0* 10.5* 10.7* 10.3*  HCT 30.5* 34.7* 35.4* 35.4*  MCV 96.5 96.1 97.0 98.3  PLT 140* 66* 183 156    @IMGRELPRIORS @ Medications:    . atorvastatin  20 mg Oral q1800  . cefTAZidime (FORTAZ)  IV  1 g Intravenous Q24H  . [START ON 02/21/2015] Darbepoetin Alfa  200 mcg Intravenous Q Thu-HD  . hydroxychloroquine  400 mg Oral Daily  . metoprolol succinate  25 mg Oral QHS  . multivitamin  1 tablet Oral QHS  . pantoprazole  20 mg Oral Daily  . QUEtiapine  25 mg Oral BID  . sodium chloride  3 mL Intravenous Q12H  . warfarin  5 mg Oral q1800  . Warfarin - Pharmacist Dosing Inpatient   Does not apply q1800   Dialysis Orders: [facility] TTS 4 hr 400/800 EDW 87.5 2 K 2 Ca no profile right thigh TDC and left thigh  graft (placed 10/7) Aranesp 200/week heparin 2600 no labs no Fe or VDRA NO labs - hx prior steal of left arm graft s/p ligation 12/19/14  Assessment/ Plan:   1. Low grade Fever and AMS- possible delirium due to infection. Agree with pan cultures and empiric antibiotics.  Will also check blood cultures from left fem AVG but no overt evidence of infection. 2. Auditory hallucinations- ?psychosis from delirium or drug induced. Pt mentioned "nerve pill" yesterday when she was lucid but nothing on MAR. Need to investigate. Also need to r/o lupus cerebritis and consider LP.  REcommend Neurology evaluation. 3. Pseudomonas catheter-related sepsis- completed fortaz 02/09/15, cath removed 4. ESRD cont with TTS. plan for HD today due to inpatient hospitalization  5. 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 Nutrition: per primary 8. Hypotension: chronic, on midodrine 10mg  tid on HD days 9. 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 Vascular access- using left thigh AVG 11. SLE- on prednisone 12. Dispo- discharge to home on hold due to AMS/fever/delirium and inpatient hospitalization.  [name], MD [place] Pager [phone] 02/20/2015, 10:11 AM

## 2015-02-20 NOTE — Progress Notes (Signed)
MD on floor to see pt. RN informed to contact MRI to attempt testing. PRN order for Tylenol received for pts. Temp. Pt. Also to be transferred to step down unit. Pts. Sister, Synthia Innocent, updated on pts. Status. RN to call mother or daughter when room assigned.
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
## 2015-02-20 NOTE — Progress Notes (Addendum)
[facility] Progress Note   HPI: Asked to evaluate for possible left thigh graft infection. The patient is s/p new left thigh AV graft on 01/04/15 by Dr. Donnetta Hutching. She is well known to our service from several recent hospitalizations. She previously developed a pseudomonal bacteremia from right right femoral HD catheter that was treated with Ceftaz through 02/09/15. She had a prior left arm hybrid graft that was ligated due to steal on 12/19/14.   She was admitted yesterday after developing acute delirium with auditory and visual hallucinations. Her left thigh graft is currently being used for HD. Her last HD was on Monday.   Objective Filed Vitals:   02/20/15 0321 02/20/15 0730  BP: 113/85   Pulse:    Temp: 98.9 F (37.2 C) 98 F (36.7 C)  Resp: 24     Intake/Output Summary (Last 24 hours) at 02/20/15 1011 Last data filed at 02/19/15 2215  Gross per 24 hour  Intake   1030 ml  Output      0 ml  Net   1030 ml   Left thigh graft is patent with palpable pulsatile thrill. Thigh is overall edematous but no fluctuance. No drainage. No erythema.    Assessment/Planning: 39 y.o. female is s/p left thigh AVG 01/04/15 presenting with acute delirium possibly secondary to sepsis.   US revealing large fluid collection next to graft. Clinically, her graft does not appear to be infected.  Hesitant to remove graft as patient's last remaining access site is her right thigh. Will remove if source of sepsis cannot be identified.   Alvia Grove 02/20/2015  10:11 AM --  Laboratory CBC    Component Value Date/Time   WBC 8.5 02/20/2015 0337   HGB 10.3* 02/20/2015 0337   HCT 35.4* 02/20/2015 0337   PLT 156 02/20/2015 0337    BMET    Component Value Date/Time   NA 137 02/20/2015 0337   K 4.7 02/20/2015 0337   CL 100* 02/20/2015 0337   CO2 28 02/20/2015 0337   GLUCOSE 85 02/20/2015 0337   BUN 9 02/20/2015 0337   CREATININE 4.68* 02/20/2015 0337   CALCIUM 8.6*  02/20/2015 0337   GFRNONAA 11* 02/20/2015 0337   GFRAA 13* 02/20/2015 0337    COAG Lab Results  Component Value Date   INR 3.34* 02/20/2015   INR 2.47* 02/19/2015   INR 2.28* 02/18/2015   No results found for: PTT  Antibiotics Anti-infectives    Start     Dose/Rate Route Frequency Ordered Stop   02/19/15 1600  hydroxychloroquine (PLAQUENIL) tablet 400 mg     400 mg Oral Daily 02/19/15 1119     02/19/15 1400  cefTAZidime (FORTAZ) 1 g in dextrose 5 % 50 mL IVPB     1 g 100 mL/hr over 30 Minutes Intravenous Every 24 hours 02/19/15 1248     02/19/15 1400  vancomycin (VANCOCIN) 1,750 mg in sodium chloride 0.9 % 500 mL IVPB     1,750 mg 250 mL/hr over 120 Minutes Intravenous  Once 02/19/15 1248 02/19/15 1856       Virgina Jock, PA-C [facility] Office: [phone] Pager: [phone] 02/20/2015 10:11 AM  Agree with above assessment Clinically on physical exam thigh graft does not appear infected-no inflammation or fluctuance to palpation in graft is functioning well  Would continue treatment with IV antibiotics-Fortaz and vancomycin. Patient has no other sites for access and would be very hesitant to remove thigh graft unless we are fairly certain  that there is infection which is not the case at the present time  Will follow with you

## 2015-02-20 NOTE — Progress Notes (Signed)
RN informed that MRI unable to be completed. Pt. Agitated at time. [name], [name]

## 2015-02-20 NOTE — Progress Notes (Signed)
ANTICOAGULATION CONSULT NOTE - Follow Up Consult  Pharmacy Consult for warfarin Indication: hx of SLE and hypercoagulable state  Allergies  Allergen Reactions  . Food Swelling    Red peppers    Patient Measurements: Height: 5\' 4"  (162.6 cm) Weight: 182 lb 8.7 oz (82.8 kg) IBW/kg (Calculated) : 54.7  Vital Signs: Temp: 98.3 F (36.8 C) (11/23 1113) Temp Source: Oral (11/23 1113) BP: 104/62 mmHg (11/23 0733) Pulse Rate: 112 (11/23 0733)  Labs:  Recent Labs  02/18/15 0614  02/18/15 1615 02/19/15 0705 02/19/15 1255 02/19/15 1655 02/20/15 0337  HGB  --   < > 9.0*  --  10.5* 10.7* 10.3*  HCT  --   < > 30.5*  --  34.7* 35.4* 35.4*  PLT  --   < > 140*  --  66* 183 156  LABPROT 24.9*  --   --  26.5*  --   --  33.2*  INR 2.28*  --   --  2.47*  --   --  3.34*  CREATININE  --   --  5.06*  --  3.97*  --  4.68*  < > = values in this interval not displayed.  Estimated Creatinine Clearance: 16.8 mL/min (by C-G formula based on Cr of 4.68).  Assessment: 39 y/o female with ESRD on warfarin for history of SLE, stroke, and hypercoagulable state. She has been on warfarin 5 mg daily since 02/13/15. INR is supratherapeutic this morning at 3.34 with significant increase from yesterday. No bleeding noted, CBC is stable.  Goal of Therapy:  INR 2-3 Monitor platelets by anticoagulation protocol: Yes   Plan:  - Hold warfarin tonight - INR daily - Monitor for s/sx of bleeding  [facility], Pharm.D., BCPS Clinical Pharmacist Pager: [phone] 02/20/2015 11:16 AM

## 2015-02-20 NOTE — Progress Notes (Signed)
Physician notified: Rice, IMTS At: [id number]  Regarding: FYI BP 86/49, was 104sys this AM.  Awaiting return response.   Returned Response at: 1144  Order(s): will place orders. Plan for HD today.

## 2015-02-20 NOTE — Progress Notes (Signed)
Subjective: Overnight she had continued tachycardia, fever, delirium, and was transferred to SDU. She underwent left thigh ultrasound and head MRI with 2mg  IV haldol sedation. This morning she was somnolent from recent treatment but was spontaneously awake. Limited answering questions with one word responses or none.  Objective: Vital signs in last 24 hours: Filed Vitals:   02/19/15 2009 02/20/15 0017 02/20/15 0321 02/20/15 0730  BP: 105/68 116/66 113/85   Pulse: 149 144    Temp:  101.5 F (38.6 C) 98.9 F (37.2 C) 98 F (36.7 C)  TempSrc: Oral Oral Oral Axillary  Resp: 20 20 24    Height:   5\' 4"  (1.626 m)   Weight:   82.8 kg (182 lb 8.7 oz)   SpO2: 100% 99% 92%    Weight change:   Intake/Output Summary (Last 24 hours) at 02/20/15 1053 Last data filed at 02/19/15 2215  Gross per 24 hour  Intake   1030 ml  Output      0 ml  Net   1030 ml   GENERAL- obese, ill appearing, co-operative, is moderately sedated HEENT- Atraumatic, PERRL, oral mucosa appears moist CARDIAC- tachycardic, regular rhythm, no murmurs, rubs or gallops. RESP- Distant lung sounds, no crackles or wheezes ABDOMEN- Soft, no guarding or rebound, normoactive bowel sounds present NEURO- Test limited by sedation this morning EXTREMITIES- 3+ edema of the left thigh with mild tenderness, 1+ distal LLE edema, right leg appears normal PSYCH- Sedated this morning, answers questions with one word  Lab Results: Basic Metabolic Panel:  Recent Labs Lab 02/18/15 1615 02/19/15 1255 02/20/15 0337  NA 135 139 137  K 3.2* 4.0 4.7  CL 100* 100* 100*  CO2 26 29 28   GLUCOSE 93 107* 85  BUN 9 6 9   CREATININE 5.06* 3.97* 4.68*  CALCIUM 8.5* 8.6* 8.6*  MG  --  1.8  --   PHOS 2.9 2.5  --    Liver Function Tests:  Recent Labs Lab 02/18/15 1615 02/19/15 1255  AST  --  32  ALT  --  18  ALKPHOS  --  101  BILITOT  --  0.7  PROT  --  5.7*  ALBUMIN 1.9* 2.3*  CBC:  Recent Labs Lab 02/19/15 1255 02/19/15 1655  02/20/15 0337  WBC 7.4 9.1 8.5  NEUTROABS 6.2  --  7.6  HGB 10.5* 10.7* 10.3*  HCT 34.7* 35.4* 35.4*  MCV 96.1 97.0 98.3  PLT 66* 183 156   CBG:  Recent Labs Lab 02/14/15 2021 02/19/15 0233  GLUCAP 71 72   Thyroid Function Tests:  Recent Labs Lab 02/19/15 1255  TSH 4.157   Coagulation:  Recent Labs Lab 02/17/15 0407 02/18/15 0614 02/19/15 0705 02/20/15 0337  LABPROT 21.6* 24.9* 26.5* 33.2*  INR 1.89* 2.28* 2.47* 3.34*   Anemia Panel: No results for input(s): VITAMINB12, FOLATE, FERRITIN, TIBC, IRON, RETICCTPCT in the last 168 hours. Urine Drug Screen: Drugs of Abuse     Component Value Date/Time   LABOPIA NONE DETECTED 07/21/2012 0215   COCAINSCRNUR NONE DETECTED 07/21/2012 0215   LABBENZ NONE DETECTED 07/21/2012 0215   AMPHETMU NONE DETECTED 07/21/2012 0215   THCU NONE DETECTED 07/21/2012 0215   LABBARB NONE DETECTED 07/21/2012 0215     Micro Results: Recent Results (from the past 240 hour(s))  C difficile quick scan w PCR reflex     Status: None   Collection Time: 02/12/15  4:35 PM  Result Value Ref Range Status   C Diff antigen NEGATIVE NEGATIVE Final  C Diff toxin NEGATIVE NEGATIVE Final   C Diff interpretation Negative for toxigenic C. difficile  Final   Studies/Results: Ct Head Wo Contrast  02/19/2015  CLINICAL DATA:  Acute onset of severe confusion. Auditory and visual hallucinations for more than 12 hours. Initial encounter. EXAM: CT HEAD WITHOUT CONTRAST TECHNIQUE: Contiguous axial images were obtained from the base of the skull through the vertex without intravenous contrast. COMPARISON:  CT of the head, and MRI of the brain performed 01/25/2015 FINDINGS: There is no evidence of acute infarction, mass lesion, or intra- or extra-axial hemorrhage on CT. Prominence of the ventricles suggests mild cortical volume loss. Mild cerebellar atrophy is noted. An evolving relatively chronic right anterior MCA territory infarct is again noted, predominantly  at the right frontoparietal region. The brainstem and fourth ventricle are within normal limits. The basal ganglia are unremarkable in appearance. The cerebral hemispheres demonstrate grossly normal gray-white differentiation. No mass effect or midline shift is seen. There is no evidence of fracture; visualized osseous structures are unremarkable in appearance. The visualized portions of the orbits are within normal limits. A mucus retention cyst or polyp is noted at the right side of the sphenoid sinus. The remaining paranasal sinuses and mastoid air cells are well-aerated. No significant soft tissue abnormalities are seen. IMPRESSION: 1. No acute intracranial pathology seen on CT. 2. 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 3. Mild cortical volume loss noted. 4. Mucus retention cyst or polyp at the right side of the sphenoid sinus. Electronically Signed   By: Garald Balding M.D.   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 EXAM: MRI HEAD WITHOUT CONTRAST TECHNIQUE: Multiplanar, multiecho pulse sequences of the brain and surrounding structures were obtained without intravenous contrast. COMPARISON:  01/25/2015 FINDINGS: Motion degraded despite sedation and repeated attempts. Study is partial. Calvarium and upper cervical spine: No focal marrow signal abnormality. Orbits: No significant findings. Sinuses and Mastoids: Clear. Brain: Recent right MCA territory infarct with expected cortical volume loss of the anterior insula, frontal operculum and lateral right frontal lobe. There is an unexpected amorphous area of continued restricted diffusion in the deep white matter lateral to the frontal horn of the right lateral ventricle. This suggests there is continued evolution of ischemia at the periphery of the occluded  vessel territory (see MRA 12/21/2014). Additionally, previously seen restricted diffusion in the right caudate and putamen has resolved without volume loss, swelling, or signal abnormality -likely sub infarct ischemia at that time. Given patient's history of bacteremia, a septic embolus is considered, but fortunately there is no focal rounded restricted diffusion typical of abscess. Additionally, would expect more swelling in the setting of cerebritis. There is no evidence of hemorrhage or major vessel occlusion. No current cortical diffusion restriction to suggest seizure. Low cerebral volume for age. IMPRESSION: 1. Incomplete motion degraded study due to patient condition. 2. Recent right MCA territory infarct has the same distribution as 01/25/2015. Persistent white matter diffusion restriction suggests continued evolution of ischemia at the periphery of the infarct core. Further discussion above. Electronically Signed   By: Monte Fantasia M.D.   On: 02/20/2015 09:03   [place] Extrem Low Left Ltd  02/20/2015  CLINICAL DATA:  39 year old female with history of left femoral loop cortex graft placed 01/04/2015. 6 mm standard Gore graft, with arterial anastomosis on the superficial femoral artery and  venous anastomosis on the common femoral vein above the saphenous femoral junction. EXAM: ULTRASOUND LEFT LOWER EXTREMITY LIMITED TECHNIQUE: Ultrasound examination of the lower extremity soft tissues was performed in the area of clinical concern. COMPARISON:  No prior duplex FINDINGS: Grayscale and color duplex ultrasound performed at the proximal left thigh. Arterial venous graft is patent with confirmed flow new. No velocities were sampled Complex fluid collection/soft tissue surrounds the graft, with the majority of fluid on the superficial aspect. Greatest diameter measures 6.6 cm x 3.2 cm x 4.3 cm. Edema within the soft tissues. IMPRESSION: Sonographic survey of left femoral arterial venous loop graft demonstrates  complex fluid collection next to patent graft, potentially hematoma, complex seroma, or abscess. Correlation with patient presentation may be useful. Signed, Dulcy Fanny. Earleen Newport, DO [facility] Electronically Signed   By: Corrie Mckusick D.O.   On: 02/20/2015 08:42   Dg Chest Port 1 View  02/19/2015  CLINICAL DATA:  Fever. EXAM: PORTABLE CHEST 1 VIEW COMPARISON:  02/09/2015 FINDINGS: Left retrocardiac opacity is similar to the prior study. This may reflect atelectasis or infiltrate. There may be a small associated effusion. Remainder of the lungs is clear. No right pleural effusion. No pneumothorax. Cardiac silhouette is mildly enlarged. No mediastinal or hilar masses or evidence of adenopathy. Left axillary 2 brachia region stent is stable. Skeletal structures are unremarkable. IMPRESSION: Mild left retrocardiac lung base opacity. This is likely atelectasis possibly with a small effusion. Pneumonia is possible. Electronically Signed   By: Lajean Manes M.D.   On: 02/19/2015 10:16   Medications: I have reviewed the patient's current medications. Scheduled Meds: . atorvastatin  20 mg Oral q1800  . cefTAZidime (FORTAZ)  IV  1 g Intravenous Q24H  . [START ON 02/21/2015] Darbepoetin Alfa  200 mcg Intravenous Q Thu-HD  . hydroxychloroquine  400 mg Oral Daily  . metoprolol succinate  25 mg Oral QHS  . multivitamin  1 tablet Oral QHS  . pantoprazole  20 mg Oral Daily  . QUEtiapine  25 mg Oral BID  . sodium chloride  3 mL Intravenous Q12H  . warfarin  5 mg Oral q1800  . Warfarin - Pharmacist Dosing Inpatient   Does not apply q1800   Continuous Infusions:  PRN Meds:.acetaminophen, ondansetron **OR** ondansetron (ZOFRAN) IV, polyethylene glycol Assessment/Plan: Acute delirium 2/2 sepsis: Patient's hyperactive delirium and hallucinations are improved this morning with substantial sedation on seroquel and haldol. Her tachycardia and hypertension are  also improved with psych medication on board, suggesting these are largely secondary to agitation at present. Remains febrile. MRI head shows no obvious changes since last stroke in October. Labs are unremarkable and don't show an acute need for HD today, would rather wait for vascular input if site is concerning. At this time her overall picture looks like an infectious etiology and her worsening findings at her HD access site are very concerning as a potential source. Her completed treatment course on Tressie Ellis was not of appropriate length for infection of an implanted surface if this was the case. 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 No it is also mildly TTP. Ultrasound demonstrates a large surrounding fluid collection of indeterminate nature. -Consult to vascular surgery, recs appreciated -Will request intra HD culture collection through graft -Continuing abtx as above  SLE: Currently on Plaquenil, which she has been on since recent admission. On repeat CBC her platelets are not truly low. Unlikely to be a major factor in her symptoms. -Continue Plaquenil -Complement levels as above  ESRD on HD: Renal labs look good today. Her last HD was Monday. We will discuss collecting repeat culture from graft at dialysis. May be low yield though as she is already on empiric broad spectrum abtx. 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   -Coumadin -Lipitor  H/o SVT/Tachycardia: May hold Toprol in setting of active infection  H/o Hypotension: Holding Midodrine due to hypertension, tachycardia likely related to agitation  Diet: Renal DVT ppx: Coumadin FULL CODE  Dispo: Disposition is deferred at this time, awaiting improvement of current medical problems. Anticipated discharge will depend on treatment plan after further evaluation.  The patient does have a current PCP Lin Landsman, MD) and does need an [facility] hospital follow-up appointment after discharge.  The patient does not have transportation limitations that hinder transportation to clinic appointments.   LOS: 1 day   Collier Salina, MD 02/20/2015, 10:53 AM

## 2015-02-21 ENCOUNTER — Inpatient Hospital Stay (HOSPITAL_COMMUNITY): Payer: Medicaid Other

## 2015-02-21 DIAGNOSIS — M3214 Glomerular disease in systemic lupus erythematosus: Secondary | ICD-10-CM

## 2015-02-21 LAB — RENAL FUNCTION PANEL
ALBUMIN: 1.7 g/dL — AB (ref 3.5–5.0)
Anion gap: 6 (ref 5–15)
BUN: 9 mg/dL (ref 6–20)
CHLORIDE: 100 mmol/L — AB (ref 101–111)
CO2: 31 mmol/L (ref 22–32)
Calcium: 8.3 mg/dL — ABNORMAL LOW (ref 8.9–10.3)
Creatinine, Ser: 4.05 mg/dL — ABNORMAL HIGH (ref 0.44–1.00)
GFR calc Af Amer: 15 mL/min — ABNORMAL LOW (ref >60)
GFR calc non Af Amer: 13 mL/min — ABNORMAL LOW (ref >60)
GLUCOSE: 61 mg/dL — AB (ref 65–99)
PHOSPHORUS: 2.7 mg/dL (ref 2.5–4.6)
POTASSIUM: 3.8 mmol/L (ref 3.5–5.1)
Sodium: 137 mmol/L (ref 135–145)

## 2015-02-21 LAB — COMPLEMENT, TOTAL: COMPL TOTAL (CH50): 43 U/mL (ref 42–60)

## 2015-02-21 LAB — CBC
HEMATOCRIT: 29.6 % — AB (ref 36.0–46.0)
Hemoglobin: 8.8 g/dL — ABNORMAL LOW (ref 12.0–15.0)
MCH: 28.9 pg (ref 26.0–34.0)
MCHC: 29.7 g/dL — AB (ref 30.0–36.0)
MCV: 97 fL (ref 78.0–100.0)
Platelets: 115 10*3/uL — ABNORMAL LOW (ref 150–400)
RBC: 3.05 MIL/uL — ABNORMAL LOW (ref 3.87–5.11)
RDW: 17.6 % — AB (ref 11.5–15.5)
WBC: 5.3 10*3/uL (ref 4.0–10.5)

## 2015-02-21 LAB — GLUCOSE, CAPILLARY: Glucose-Capillary: 125 mg/dL — ABNORMAL HIGH (ref 65–99)

## 2015-02-21 LAB — C4 COMPLEMENT: COMPLEMENT C4, BODY FLUID: 21 mg/dL (ref 14–44)

## 2015-02-21 LAB — C3 COMPLEMENT: C3 COMPLEMENT: 47 mg/dL — AB (ref 82–167)

## 2015-02-21 LAB — PROTIME-INR
INR: 2.71 — AB (ref 0.00–1.49)
PROTHROMBIN TIME: 28.3 s — AB (ref 11.6–15.2)

## 2015-02-21 MED ORDER — METOPROLOL TARTRATE 1 MG/ML IV SOLN: 2.5000 mg | INTRAVENOUS | Status: DC | PRN

## 2015-02-21 MED ORDER — METOPROLOL TARTRATE 25 MG PO TABS
25.0000 mg | ORAL_TABLET | Freq: Two times a day (BID) | ORAL | Status: DC
  Administered 2015-02-21 – 2015-02-22 (×2): 25 mg via ORAL
  Filled 2015-02-21 (×5): qty 1

## 2015-02-21 MED ORDER — CITALOPRAM HYDROBROMIDE 10 MG PO TABS: 10.0000 mg | ORAL_TABLET | Freq: Every day | ORAL | Status: DC

## 2015-02-21 MED ORDER — QUETIAPINE FUMARATE 25 MG PO TABS
25.0000 mg | ORAL_TABLET | Freq: Once | ORAL | Status: AC
  Administered 2015-02-21: 25 mg via ORAL
  Filled 2015-02-21: qty 1

## 2015-02-21 NOTE — Progress Notes (Signed)
Pt with increasing confusion and appears to be "off" stating, "people are mimicking her and laughing, and she just wants everyone to be quiet so she can remains safe". Md notified. Medication order received. Will continue to monitor.

## 2015-02-21 NOTE — Progress Notes (Signed)
MD notified of pt's HR sustained 150's-160's. Order given for po metoprolol. Will continue to monitor.

## 2015-02-21 NOTE — Progress Notes (Signed)
Subjective: No acute events overnight. No fever since yesterday. Dialysis yesterday was limited by hypotension but she reports it went well. Reports she has not been hallucinating other individuals in her room since yesterday.  Objective: Vital signs in last 24 hours: Filed Vitals:   02/20/15 2110 02/20/15 2353 02/21/15 0607 02/21/15 0809  BP: 106/59 96/61 103/66 99/56  Pulse: 110 109 109 111  Temp: 98.3 F (36.8 C) 98.8 F (37.1 C) 98.4 F (36.9 C)   TempSrc: Axillary Oral Oral   Resp: 22 17 17 24   Height:   5\' 3"  (1.6 m)   Weight:   83.1 kg (183 lb 3.2 oz)   SpO2: 100% 100% 100% 100%   Weight change: 2.3 kg (5 lb 1.1 oz)  Intake/Output Summary (Last 24 hours) at 02/21/15 0838 Last data filed at 02/20/15 2210  Gross per 24 hour  Intake    420 ml  Output   -268 ml  Net    688 ml   GENERAL- obese, ill appearing, co-operative, NAD HEENT- Atraumatic, PERRL, oral mucosa appears moist CARDIAC- tachycardic, regular rhythm, no murmurs, rubs or gallops. RESP- Distant lung sounds, no crackles or wheezes ABDOMEN- Soft, no guarding or rebound, normoactive bowel sounds present NEURO- 5/5 RUE strength, 4/5 LUE strength EXTREMITIES- 3+ edema of the left thigh with mild tenderness, 1+ distal LLE edema, right leg appears normal PSYCH- Calm, flat affect, appropriate thought and speech  Lab Results: Basic Metabolic Panel:  Recent Labs Lab 02/19/15 1255 02/20/15 0337 02/21/15 0347  NA 139 137 137  K 4.0 4.7 3.8  CL 100* 100* 100*  CO2 29 28 31   GLUCOSE 107* 85 61*  BUN 6 9 9   CREATININE 3.97* 4.68* 4.05*  CALCIUM 8.6* 8.6* 8.3*  MG 1.8  --   --   PHOS 2.5  --  2.7   Liver Function Tests:  Recent Labs Lab 02/19/15 1255 02/21/15 0347  AST 32  --   ALT 18  --   ALKPHOS 101  --   BILITOT 0.7  --   PROT 5.7*  --   ALBUMIN 2.3* 1.7*  CBC:  Recent Labs Lab 02/19/15 1255  02/20/15 0337 02/21/15 0347  WBC 7.4  < > 8.5 5.3  NEUTROABS 6.2  --  7.6  --   HGB 10.5*  <  > 10.3* 8.8*  HCT 34.7*  < > 35.4* 29.6*  MCV 96.1  < > 98.3 97.0  PLT 66*  < > 156 115*  < > = values in this interval not displayed. CBG:  Recent Labs Lab 02/14/15 2021 02/19/15 0233  GLUCAP 71 72   Thyroid Function Tests:  Recent Labs Lab 02/19/15 1255  TSH 4.157   Coagulation:  Recent Labs Lab 02/18/15 0614 02/19/15 0705 02/20/15 0337 02/21/15 0347  LABPROT 24.9* 26.5* 33.2* 28.3*  INR 2.28* 2.47* 3.34* 2.71*   Anemia Panel: No results for input(s): VITAMINB12, FOLATE, FERRITIN, TIBC, IRON, RETICCTPCT in the last 168 hours. Urine Drug Screen: Drugs of Abuse     Component Value Date/Time   LABOPIA NONE DETECTED 07/21/2012 0215   COCAINSCRNUR NONE DETECTED 07/21/2012 0215   LABBENZ NONE DETECTED 07/21/2012 0215   AMPHETMU NONE DETECTED 07/21/2012 0215   THCU NONE DETECTED 07/21/2012 0215   LABBARB NONE DETECTED 07/21/2012 0215     Micro Results: Recent Results (from the past 240 hour(s))  C difficile quick scan w PCR reflex     Status: None   Collection Time: 02/12/15  4:35  PM  Result Value Ref Range Status   C Diff antigen NEGATIVE NEGATIVE Final   C Diff toxin NEGATIVE NEGATIVE Final   C Diff interpretation Negative for toxigenic C. difficile  Final  Culture, blood (routine x 2)     Status: None (Preliminary result)   Collection Time: 02/19/15  9:50 AM  Result Value Ref Range Status   Specimen Description BLOOD RIGHT ANTECUBITAL  Final   Special Requests BOTTLES DRAWN AEROBIC AND ANAEROBIC 5CCS  Final   Culture NO GROWTH 1 DAY  Final   Report Status PENDING  Incomplete  Culture, blood (routine x 2)     Status: None (Preliminary result)   Collection Time: 02/19/15 10:00 AM  Result Value Ref Range Status   Specimen Description BLOOD RIGHT HAND  Final   Special Requests BOTTLES DRAWN AEROBIC AND ANAEROBIC 5CCS  Final   Culture NO GROWTH 1 DAY  Final   Report Status PENDING  Incomplete   Studies/Results: Mr Brain Wo Contrast  02/20/2015  CLINICAL  DATA:  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 EXAM: MRI HEAD WITHOUT CONTRAST TECHNIQUE: Multiplanar, multiecho pulse sequences of the brain and surrounding structures were obtained without intravenous contrast. COMPARISON:  01/25/2015 FINDINGS: Motion degraded despite sedation and repeated attempts. Study is partial. Calvarium and upper cervical spine: No focal marrow signal abnormality. Orbits: No significant findings. Sinuses and Mastoids: Clear. Brain: Recent right MCA territory infarct with expected cortical volume loss of the anterior insula, frontal operculum and lateral right frontal lobe. There is an unexpected amorphous area of continued restricted diffusion in the deep white matter lateral to the frontal horn of the right lateral ventricle. This suggests there is continued evolution of ischemia at the periphery of the occluded vessel territory (see MRA 12/21/2014). Additionally, previously seen restricted diffusion in the right caudate and putamen has resolved without volume loss, swelling, or signal abnormality -likely sub infarct ischemia at that time. Given patient's history of bacteremia, a septic embolus is considered, but fortunately there is no focal rounded restricted diffusion typical of abscess. Additionally, would expect more swelling in the setting of cerebritis. There is no evidence of hemorrhage or major vessel occlusion. No current cortical diffusion restriction to suggest seizure. Low cerebral volume for age. IMPRESSION: 1. Incomplete motion degraded study due to patient condition. 2. Recent right MCA territory infarct has the same distribution as 01/25/2015. Persistent white matter diffusion restriction suggests continued evolution of ischemia at the periphery of the infarct core. Further discussion above. Electronically Signed   By: Monte Fantasia M.D.   On: 02/20/2015 09:03   [place] Extrem Low Left Ltd  02/20/2015  CLINICAL DATA:   39 year old female with history of left femoral loop cortex graft placed 01/04/2015. 6 mm standard Gore graft, with arterial anastomosis on the superficial femoral artery and venous anastomosis on the common femoral vein above the saphenous femoral junction. EXAM: ULTRASOUND LEFT LOWER EXTREMITY LIMITED TECHNIQUE: Ultrasound examination of the lower extremity soft tissues was performed in the area of clinical concern. COMPARISON:  No prior duplex FINDINGS: Grayscale and color duplex ultrasound performed at the proximal left thigh. Arterial venous graft is patent with confirmed flow new. No velocities were sampled Complex fluid collection/soft tissue surrounds the graft, with the majority of fluid on the superficial aspect. Greatest diameter measures 6.6 cm x 3.2 cm x 4.3 cm. Edema within the soft tissues. IMPRESSION: Sonographic survey of left femoral arterial venous loop graft demonstrates complex fluid collection next to patent graft,  potentially hematoma, complex seroma, or abscess. Correlation with patient presentation may be useful. Signed, Dulcy Fanny. Earleen Newport, DO [facility] Electronically Signed   By: Corrie Mckusick D.O.   On: 02/20/2015 08:42   Dg Chest Port 1 View  02/19/2015  CLINICAL DATA:  Fever. EXAM: PORTABLE CHEST 1 VIEW COMPARISON:  02/09/2015 FINDINGS: Left retrocardiac opacity is similar to the prior study. This may reflect atelectasis or infiltrate. There may be a small associated effusion. Remainder of the lungs is clear. No right pleural effusion. No pneumothorax. Cardiac silhouette is mildly enlarged. No mediastinal or hilar masses or evidence of adenopathy. Left axillary 2 brachia region stent is stable. Skeletal structures are unremarkable. IMPRESSION: Mild left retrocardiac lung base opacity. This is likely atelectasis possibly with a small effusion. Pneumonia is possible. Electronically Signed   By: Lajean Manes M.D.   On:  02/19/2015 10:16   Medications: I have reviewed the patient's current medications. Scheduled Meds: . atorvastatin  20 mg Oral q1800  . cefTAZidime (FORTAZ)  IV  1 g Intravenous Q24H  . Darbepoetin Alfa  200 mcg Intravenous Q Thu-HD  . hydroxychloroquine  400 mg Oral Daily  . midodrine  10 mg Oral BID WC  . multivitamin  1 tablet Oral QHS  . pantoprazole  20 mg Oral Daily  . QUEtiapine  25 mg Oral BID  . sodium chloride  3 mL Intravenous Q12H   Continuous Infusions:  PRN Meds:.acetaminophen, ondansetron **OR** ondansetron (ZOFRAN) IV, polyethylene glycol Assessment/Plan: Acute delirium 2/2 sepsis: Delirium appears to be improving with antibiotics and antipsychotics. Of note straight cath yesterday showed high bacteria and purulent appearing contents. UTI in the setting of minimal urine output due to ESRD seems likely. She was previously treated for proteus UTI in early October.  Urine cultures now positive at 24hrs today for enterococcus sp. and GNRs.  -F/U blood cultures -F/U urine cultures -qAM CBC, renal labs -Continue vanc and ceftaz (day 3)  -F/U C3, C4, total complement levels -Tylenol prn for fever -seroquel 25mg  BID for delirium, agitation, may increase PRN if failure to improve -Repeat EKG tomorrow to assess QTc on BID seroquel  Left thigh edema: Ultrasound demonstrated a large surrounding fluid collection of indeterminate nature. On discussion with vascular surgery and nephrology, no current plan to aspirate fluid unless new evidence. -F/U HD graft Cx sample -Continuing abtx as above  SLE: Unlikely to be primary cause of mental status changes, as she is improving with antibiotics. C3 low at 47, total complement and C4 WNL. Will reassess this and consider neuro input or LP regarding cerebritis if her symptoms fail to resolve with abtx. -Continue Plaquenil  ESRD on HD:  MWF schedule currently. Her BP remains a limitation for dialysis but tolerated most of Wednesday  session. 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  -Coumadin -Lipitor  H/o SVT/Tachycardia: Holding Toprol-XL 25mg  in setting of active infection  Diet: Renal DVT ppx: Coumadin (held x1 day) FULL CODE  Dispo: Disposition is deferred at this time, awaiting improvement of current medical problems. Anticipated discharge will depend on treatment plan after further evaluation.  The patient does have a current PCP Lin Landsman, MD) and does need an [facility] hospital follow-up appointment after discharge.  The patient does not have transportation limitations that hinder transportation to clinic appointments.   LOS: 2 days  Collier Salina, MD 02/21/2015, 8:38 AM

## 2015-02-21 NOTE — Progress Notes (Signed)
ANTICOAGULATION CONSULT NOTE - Follow Up Consult  Pharmacy Consult for warfarin and heparin Indication: h/o CVA, SLE and hypercoagulable state  Allergies  Allergen Reactions  . Food Swelling    Red peppers    Patient Measurements: Height: 5\' 3"  (160 cm) Weight: 183 lb 3.2 oz (83.1 kg) IBW/kg (Calculated) : 52.4 kg Heparin dosing wt: 70.8 kg  Vital Signs: Temp: 98.6 F (37 C) (11/24 1232) Temp Source: Axillary (11/24 1232) BP: 144/81 mmHg (11/24 1232) Pulse Rate: 111 (11/24 0809)  Labs:  Recent Labs  02/19/15 0705 02/19/15 1255 02/19/15 1655 02/20/15 0337 02/21/15 0347  HGB  --  10.5* 10.7* 10.3* 8.8*  HCT  --  34.7* 35.4* 35.4* 29.6*  PLT  --  66* 183 156 115*  LABPROT 26.5*  --   --  33.2* 28.3*  INR 2.47*  --   --  3.34* 2.71*  CREATININE  --  3.97*  --  4.68* 4.05*    Estimated Creatinine Clearance: 19 mL/min (by C-G formula based on Cr of 4.05).  Assessment: 39yo female with ESRD on warfarin for history of SLE, stroke, and hypercoagulable state. She was on warfarin 5mg  daily since 02/13/15.  Warfarin currently being held - initially d/t supratherapeutic INR but now continuing to hold d/t possibility of surgery to remove left thigh AV graft if suspected to be cause of sepsis.  Pharmacy consulted to start heparin drip when INR < 2.  Current INR 2.71, last dose warfarin 02/19/15.  Goal of Therapy:  INR 2-3 Heparin level 0.3-0.7 units/ml Monitor platelets by anticoagulation protocol: Yes   Plan:  - Continue to hold warfarin for now - Start heparin drip when INR < 2 - F/u plans for surgery   Drucie Opitz, PharmD, CPP Clinical Pharmacist Pager: [phone] 02/21/2015 1:03 PM

## 2015-02-21 NOTE — Progress Notes (Addendum)
Patient remains tachycardic back from ultrasound with SBP in the 150's-170's. Alert and oriented x2, pleasantly confused. K. FloresMD  was notified of patients vitals signs. Will continue to monitor.

## 2015-02-21 NOTE — Progress Notes (Signed)
Patient ID: [name], female   DOB: April 17, 1975, 39 y.o.   MRN: [id number]   [facility] Progress Note    Subjective:   Calm this am but still delirious   Objective:   BP 99/56 mmHg  Pulse 111  Temp(Src) 98.4 F (36.9 C) (Oral)  Resp 24  Ht 5\' 3"  (1.6 m)  Wt 83.1 kg (183 lb 3.2 oz)  BMI 32.46 kg/m2  SpO2 100%  LMP 01/21/2015  Intake/Output: I/O last 3 completed shifts: In: 660 [P.O.:410; IV Piggyback:250] Out: -268    Intake/Output this shift:    Weight change: 2.3 kg (5 lb 1.1 oz)  Physical Exam: Gen:WD [name] in NAD [id number] Resp:cta [id number] Ext: left thigh AVG +T/B, no purulent drainage  Labs: BMET  Recent Labs Lab 02/14/15 1615 02/16/15 1913 02/18/15 1615 02/19/15 1255 02/20/15 0337 02/21/15 0347  NA 138 137 135 139 137 137  K 3.9 3.4* 3.2* 4.0 4.7 3.8  CL 102 100* 100* 100* 100* 100*  CO2 31 28 26 29 28 31   GLUCOSE 82 73 93 107* 85 61*  BUN 12 9 9 6 9 9   CREATININE 5.16* 5.24* 5.06* 3.97* 4.68* 4.05*  ALBUMIN 2.1* 2.0* 1.9* 2.3*  --  1.7*  CALCIUM 9.0 8.9 8.5* 8.6* 8.6* 8.3*  PHOS 2.9 3.0 2.9 2.5  --  2.7   CBC  Recent Labs Lab 02/19/15 1255 02/19/15 1655 02/20/15 0337 02/21/15 0347  WBC 7.4 9.1 8.5 5.3  NEUTROABS 6.2  --  7.6  --   HGB 10.5* 10.7* 10.3* 8.8*  HCT 34.7* 35.4* 35.4* 29.6*  MCV 96.1 97.0 98.3 97.0  PLT 66* 183 156 115*    @IMGRELPRIORS @ Medications:    . atorvastatin  20 mg Oral q1800  . cefTAZidime (FORTAZ)  IV  1 g Intravenous Q24H  . Darbepoetin Alfa  200 mcg Intravenous Q Thu-HD  . hydroxychloroquine  400 mg Oral Daily  . midodrine  10 mg Oral BID WC  . multivitamin  1 tablet Oral QHS  . pantoprazole  20 mg Oral Daily  . QUEtiapine  25 mg Oral BID  . 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 Low grade Fever and AMS- possible delirium due to infection. Agree with pan cultures and empiric antibiotics. Will also check blood cultures from left fem AVG but no overt evidence of infection. 2. Auditory hallucinations- ?psychosis from delirium or drug induced. Pt mentioned "nerve pill" yesterday when she was lucid but nothing on MAR. Also need to r/o lupus cerebritis and consider LP.  1. Recommend Neurology evaluation to help further evaluate her AMS. 3. Pseudomonas catheter-related sepsis- completed fortaz 02/09/15, cath removed 4. ESRD cont with TTS. plan for HD today due to inpatient hospitalization  5. 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 Nutrition: per primary 8. Hypotension: chronic, on midodrine 10mg  tid on HD days 9. 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 Vascular access- using left thigh AVG agree with VVS not to remove AVG at this time given limited access sites available.   11. Severe protein malnutrition- per primary svc 12. SLE- on prednisone 13. Dispo- discharge to home on hold  due to AMS/fever/delirium and inpatient hospitalization.  [name], MD [place] Pager [phone] 02/21/2015, 8:29 AM

## 2015-02-21 NOTE — Progress Notes (Signed)
Notified of pt's HR ST 130-140's. No orders given. Will continue to monitor patient.

## 2015-02-22 ENCOUNTER — Inpatient Hospital Stay (HOSPITAL_COMMUNITY): Payer: Medicaid Other

## 2015-02-22 DIAGNOSIS — M329 Systemic lupus erythematosus, unspecified: Secondary | ICD-10-CM

## 2015-02-22 DIAGNOSIS — Z992 Dependence on renal dialysis: Secondary | ICD-10-CM

## 2015-02-22 DIAGNOSIS — N186 End stage renal disease: Secondary | ICD-10-CM

## 2015-02-22 DIAGNOSIS — R109 Unspecified abdominal pain: Secondary | ICD-10-CM

## 2015-02-22 DIAGNOSIS — R1030 Lower abdominal pain, unspecified: Secondary | ICD-10-CM

## 2015-02-22 LAB — BLOOD GAS, ARTERIAL
Acid-Base Excess: 3.1 mmol/L — ABNORMAL HIGH (ref 0.0–2.0)
Bicarbonate: 25.9 mEq/L — ABNORMAL HIGH (ref 20.0–24.0)
Drawn by: 103701
FIO2: 0.21
O2 Saturation: 95.8 %
PCO2 ART: 33.3 mmHg — AB (ref 35.0–45.0)
PH ART: 7.507 — AB (ref 7.350–7.450)
Patient temperature: 100.8
TCO2: 26.9 mmol/L (ref 0–100)
pO2, Arterial: 79.1 mmHg — ABNORMAL LOW (ref 80.0–100.0)

## 2015-02-22 LAB — CK: Total CK: 14 U/L — ABNORMAL LOW (ref 38–234)

## 2015-02-22 LAB — LACTIC ACID, PLASMA
LACTIC ACID, VENOUS: 2.3 mmol/L — AB (ref 0.5–2.0)
Lactic Acid, Venous: 2.8 mmol/L (ref 0.5–2.0)

## 2015-02-22 LAB — PROTIME-INR
INR: 2.06 — ABNORMAL HIGH (ref 0.00–1.49)
Prothrombin Time: 23.1 seconds — ABNORMAL HIGH (ref 11.6–15.2)

## 2015-02-22 MED ORDER — SODIUM CHLORIDE 0.9 % IV SOLN
3.0000 g | INTRAVENOUS | Status: DC
  Administered 2015-02-22 – 2015-03-03 (×10): 3 g via INTRAVENOUS
  Filled 2015-02-22 (×12): qty 3

## 2015-02-22 MED ORDER — HYDROMORPHONE HCL 1 MG/ML IJ SOLN
1.0000 mg | Freq: Once | INTRAMUSCULAR | Status: AC
  Administered 2015-02-22: 1 mg via INTRAVENOUS
  Filled 2015-02-22: qty 1

## 2015-02-22 MED ORDER — METOPROLOL TARTRATE 1 MG/ML IV SOLN: 2.5000 mg | Freq: Once | INTRAVENOUS | Status: DC

## 2015-02-22 MED ORDER — SODIUM CHLORIDE 0.9 % IV BOLUS (SEPSIS)
250.0000 mL | Freq: Once | INTRAVENOUS | Status: AC
  Administered 2015-02-22: 250 mL via INTRAVENOUS

## 2015-02-22 MED ORDER — ACETAMINOPHEN 650 MG RE SUPP
650.0000 mg | RECTAL | Status: DC | PRN
  Administered 2015-02-22: 650 mg via RECTAL

## 2015-02-22 MED ORDER — HEPARIN (PORCINE) IN NACL 100-0.45 UNIT/ML-% IJ SOLN
1100.0000 [IU]/h | INTRAMUSCULAR | Status: DC
  Administered 2015-02-22: 1100 [IU]/h via INTRAVENOUS
  Filled 2015-02-22 (×2): qty 250

## 2015-02-22 NOTE — Progress Notes (Addendum)
ANTICOAGULATION CONSULT NOTE - Follow Up Consult  Pharmacy Consult for heparin when INR <2 Indication: hx of SLE and hypercoagulable state  Allergies  Allergen Reactions  . Food Swelling    Red peppers    Patient Measurements: Height: 5\' 3"  (160 cm) Weight: 183 lb 3.2 oz (83.1 kg) IBW/kg (Calculated) : 52.4  Heparin dosing weight: 71 kg  Vital Signs: Temp: 97.6 F (36.4 C) (11/25 1151) Temp Source: Oral (11/25 1151) BP: 144/96 mmHg (11/25 0750) Pulse Rate: 141 (11/25 0750)  Labs:  Recent Labs  02/19/15 1255 02/19/15 1655 02/20/15 0337 02/21/15 0347 02/22/15 0950  HGB 10.5* 10.7* 10.3* 8.8*  --   HCT 34.7* 35.4* 35.4* 29.6*  --   PLT 66* 183 156 115*  --   LABPROT  --   --  33.2* 28.3* 23.1*  INR  --   --  3.34* 2.71* 2.06*  CREATININE 3.97*  --  4.68* 4.05*  --     Estimated Creatinine Clearance: 19 mL/min (by C-G formula based on Cr of 4.05).  Assessment: 39 y/o female with ESRD on warfarin for history of SLE, stroke, and hypercoagulable state. She has been on warfarin 5 mg daily since 02/13/15. Warfarin is on hold for possible lumbar puncture or other procedure. Pharmacy consulted to begin heparin when INR <2.  INR is 2.06 and trending down s/p vitamin K given on 11/23. Will begin IV heparin later today with no bolus. No bleeding noted, Hb is low platelets are also low at 115. She had a previous therapeutic heparin level on 1100 units/hr.  Goal of Therapy:  INR 2-3 Monitor platelets by anticoagulation protocol: Yes   Plan:  - Begin heparin drip at 1100 units/hr with no bolus at 18:00 - 8 hr heparin level - Daily heparin level and CBC - watch closely - Monitor for s/sx of bleeding   [facility], [place].D., BCPS Clinical Pharmacist Pager: [phone] 02/22/2015 11:57 AM

## 2015-02-22 NOTE — Progress Notes (Signed)
CRITICAL VALUE ALERT  Critical value received:  Lactic acid 2.3  Date of notification:  02/22/2015  Time of notification:  [id number]  Critical value read back:Yes.    Nurse who received alert:  Pecolia Ades  MD notified (1st page):  Dr. Asencion Noble  Time of first page:  1039  MD notified (2nd page):   Time of second page:  Responding MD: Dr. Benjamine Mola  Time MD responded:  [phone]

## 2015-02-22 NOTE — Progress Notes (Signed)
Pt's HR 140s-150s ST BP 137/98 RR 42 and SpO2 100% on 2L O2. 250cc bolus, that was started by day shift RN, is complete. Pt will not speak. She did grip my hands and wiggle her toes. Pt is staring off to the left. MD notified. No new orders received. Will continue to monitor closely.

## 2015-02-22 NOTE — Progress Notes (Signed)
MD Posey Pronto notified of pt's ABG results, decreased neuro status, and HR maintaining 140's with RR in 30-40's. No orders given. Will continue to monitor.

## 2015-02-22 NOTE — Progress Notes (Signed)
OT Cancellation Note  Patient Details Name: [name] MRN: [id number] DOB: 11/23/1975   Cancelled Treatment:    Reason Eval/Treat Not Completed: Medical issues which prohibited therapy (pt tachycardic)  [place], OTR/L  [id number] 02/22/2015 02/22/2015, 2:59 PM

## 2015-02-22 NOTE — Progress Notes (Signed)
Subjective: Patient had significant anxiety and agitation when meeting with family last night. She has recurrent fever yesterday with associated tachycardia and tachypnea. Her confusion is much worse today than yesterday morning.  Objective: Vital signs in last 24 hours: Filed Vitals:   02/22/15 0301 02/22/15 0435 02/22/15 0620 02/22/15 0750  BP: 180/104 162/103  144/96  Pulse:  141  141  Temp:    98.2 F (36.8 C)  TempSrc:    Oral  Resp:  38  25  Height:   5\' 3"  (1.6 m)   Weight:   83.1 kg (183 lb 3.2 oz)   SpO2:  98%  99%   Weight change: -1.3 kg (-2 lb 13.9 oz)  Intake/Output Summary (Last 24 hours) at 02/22/15 0929 Last data filed at 02/22/15 0800  Gross per 24 hour  Intake    120 ml  Output      0 ml  Net    120 ml   GENERAL- obese, ill appearing, co-operative, appears very anxious HEENT- Atraumatic, PERRL, oral mucosa appears moist CARDIAC- tachycardic, regular rhythm, no murmurs, rubs or gallops. RESP- Distant lung sounds, no crackles or wheezes ABDOMEN- Lower abdominal TTP, minimal guarding NEURO- Not moving her left leg on command, LUE movement after long delay to commands, no facial assymetry EXTREMITIES- 3+ edema of the left thigh with mild tenderness, 1+ distal LLE edema, right leg appears normal PSYCH- Anxious, exaggerated startle, extremely soft speech, delayed responsiveness  Lab Results: Basic Metabolic Panel:  Recent Labs Lab 02/19/15 1255 02/20/15 0337 02/21/15 0347  NA 139 137 137  K 4.0 4.7 3.8  CL 100* 100* 100*  CO2 29 28 31   GLUCOSE 107* 85 61*  BUN 6 9 9   CREATININE 3.97* 4.68* 4.05*  CALCIUM 8.6* 8.6* 8.3*  MG 1.8  --   --   PHOS 2.5  --  2.7   Liver Function Tests:  Recent Labs Lab 02/19/15 1255 02/21/15 0347  AST 32  --   ALT 18  --   ALKPHOS 101  --   BILITOT 0.7  --   PROT 5.7*  --   ALBUMIN 2.3* 1.7*  CBC:  Recent Labs Lab 02/19/15 1255  02/20/15 0337 02/21/15 0347  WBC 7.4  < > 8.5 5.3  NEUTROABS 6.2  --  7.6   --   HGB 10.5*  < > 10.3* 8.8*  HCT 34.7*  < > 35.4* 29.6*  MCV 96.1  < > 98.3 97.0  PLT 66*  < > 156 115*  < > = values in this interval not displayed. CBG:  Recent Labs Lab 02/19/15 0233 02/21/15 2211  GLUCAP 72 125*   Thyroid Function Tests:  Recent Labs Lab 02/19/15 1255  TSH 4.157   Coagulation:  Recent Labs Lab 02/18/15 0614 02/19/15 0705 02/20/15 0337 02/21/15 0347  LABPROT 24.9* 26.5* 33.2* 28.3*  INR 2.28* 2.47* 3.34* 2.71*   Anemia Panel: No results for input(s): VITAMINB12, FOLATE, FERRITIN, TIBC, IRON, RETICCTPCT in the last 168 hours. Urine Drug Screen: Drugs of Abuse     Component Value Date/Time   LABOPIA NONE DETECTED 07/21/2012 0215   COCAINSCRNUR NONE DETECTED 07/21/2012 0215   LABBENZ NONE DETECTED 07/21/2012 0215   AMPHETMU NONE DETECTED 07/21/2012 0215   THCU NONE DETECTED 07/21/2012 0215   LABBARB NONE DETECTED 07/21/2012 0215     Micro Results: Recent Results (from the past 240 hour(s))  C difficile quick scan w PCR reflex     Status: None  Collection Time: 02/12/15  4:35 PM  Result Value Ref Range Status   C Diff antigen NEGATIVE NEGATIVE Final   C Diff toxin NEGATIVE NEGATIVE Final   C Diff interpretation Negative for toxigenic C. difficile  Final  Culture, blood (routine x 2)     Status: None (Preliminary result)   Collection Time: 02/19/15  9:50 AM  Result Value Ref Range Status   Specimen Description BLOOD RIGHT ANTECUBITAL  Final   Special Requests BOTTLES DRAWN AEROBIC AND ANAEROBIC 5CCS  Final   Culture NO GROWTH 2 DAYS  Final   Report Status PENDING  Incomplete  Culture, blood (routine x 2)     Status: None (Preliminary result)   Collection Time: 02/19/15 10:00 AM  Result Value Ref Range Status   Specimen Description BLOOD RIGHT HAND  Final   Special Requests BOTTLES DRAWN AEROBIC AND ANAEROBIC 5CCS  Final   Culture NO GROWTH 2 DAYS  Final   Report Status PENDING  Incomplete  Urine culture     Status: None  (Preliminary result)   Collection Time: 02/20/15 12:35 PM  Result Value Ref Range Status   Specimen Description URINE, CATHETERIZED  Final   Special Requests NONE  Final   Culture   Final    >=100,000 COLONIES/mL ENTEROCOCCUS SPECIES >=100,000 COLONIES/mL ACINETOBACTER CALCOACETICUS/BAUMANNII COMPLEX    Report Status PENDING  Incomplete   Organism ID, Bacteria ACINETOBACTER CALCOACETICUS/BAUMANNII COMPLEX  Final      Susceptibility   Acinetobacter calcoaceticus/baumannii complex - MIC*    CEFTAZIDIME >=64 RESISTANT Resistant     CEFTRIAXONE >=64 RESISTANT Resistant     CIPROFLOXACIN >=4 RESISTANT Resistant     GENTAMICIN 2 SENSITIVE Sensitive     IMIPENEM 2 SENSITIVE Sensitive     PIP/TAZO 32 INTERMEDIATE Intermediate     TRIMETH/SULFA >=320 RESISTANT Resistant     AMPICILLIN/SULBACTAM 4 SENSITIVE Sensitive     * >=100,000 COLONIES/mL ACINETOBACTER CALCOACETICUS/BAUMANNII COMPLEX  Culture, blood (routine x 2)     Status: None (Preliminary result)   Collection Time: 02/20/15  3:30 PM  Result Value Ref Range Status   Specimen Description BLOOD VENOUS  Final   Special Requests BOTTLES DRAWN AEROBIC AND ANAEROBIC 10ML  Final   Culture NO GROWTH < 24 HOURS  Final   Report Status PENDING  Incomplete  Culture, blood (routine x 2)     Status: None (Preliminary result)   Collection Time: 02/20/15  4:16 PM  Result Value Ref Range Status   Specimen Description BLOOD ARTERIAL LINE  Final   Special Requests BOTTLES DRAWN AEROBIC AND ANAEROBIC 10ML  Final   Culture NO GROWTH < 24 HOURS  Final   Report Status PENDING  Incomplete   Studies/Results: Dg Abd 1 View  02/21/2015  CLINICAL DATA:  Abdominal pain and diarrhea EXAM: ABDOMEN - 1 VIEW COMPARISON:  CT abdomen and pelvis January 14, 2015 FINDINGS: There is moderate stool in the colon. There is no bowel dilatation or air-fluid level suggesting obstruction. No free air is seen on this supine examination. There are surgical clips overlying  the proximal left femur. IMPRESSION: No demonstrable obstruction or free air.  Moderate stool in colon. Electronically Signed   By: Lowella Grip III M.D.   On: 02/21/2015 19:42   US Renal  02/21/2015  CLINICAL DATA:  Flank pain EXAM: RENAL / URINARY TRACT ULTRASOUND COMPLETE COMPARISON:  CT abdomen and pelvis January 14, 2015 FINDINGS: Right Kidney: Length: 9.1 cm. Echogenicity and renal cortical thickness are within normal limits.  No perinephric fluid or hydronephrosis visualized. There is a cyst arising from the upper pole of the right kidney measuring 1.1 x 1.3 x 1.2 cm. There is a second nearby cyst in the upper pole right kidney measuring 1.2 x 0.9 x 1.0 cm. No sonographically demonstrable calculus or ureterectasis. Left Kidney: Length: 9.6 cm. Echogenicity and renal cortical thickness are within normal limits. No mass, perinephric fluid, or hydronephrosis visualized. No sonographically demonstrable calculus or ureterectasis. Bladder: Appears normal for degree of bladder distention. IMPRESSION: Small right renal cysts.  Study otherwise unremarkable. Electronically Signed   By: Lowella Grip III M.D.   On: 02/21/2015 19:22   Medications: I have reviewed the patient's current medications. Scheduled Meds: . ampicillin-sulbactam (UNASYN) IV  3 g Intravenous Q24H  . atorvastatin  20 mg Oral q1800  . Darbepoetin Alfa  200 mcg Intravenous Q Thu-HD  . hydroxychloroquine  400 mg Oral Daily  . metoprolol tartrate  25 mg Oral BID  . midodrine  10 mg Oral BID WC  . multivitamin  1 tablet Oral QHS  . pantoprazole  20 mg Oral Daily  . QUEtiapine  25 mg Oral BID  . sodium chloride  3 mL Intravenous Q12H   Continuous Infusions:  PRN Meds:.acetaminophen, ondansetron **OR** ondansetron (ZOFRAN) IV, polyethylene glycol Assessment/Plan: Acute delirium 2/2 sepsis: Worse today than yesterday. 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 Based on this she has not  been appropriately covered with abtx for the past 3 days. We will change treatment today. Consulted neurology in the setting of her failure to improve altered mental status. Will obtain EEG, will follow up additional recs as appropriate. -F/U blood cultures -Lactic acid -qAM CBC, renal labs -Stop vanc and ceftaz (day 4),start Unasyn -Tylenol prn for fever -seroquel 25mg  BID for delirium, agitation, may increase PRN if failure to improve  Left thigh edema: Ultrasound demonstrated a large surrounding fluid collection of indeterminate nature, without strong exam evidence of inflammation, no current plan to aspirate. -F/U HD graft Cx sample -Continuing abtx as above  SLE: C3 low at 47, total complement and C4 WNL. Neuro consulted. Difficult to rule lupus cerebritis in or out during active infection. -Continue Plaquenil  ESRD on HD:  MWF schedule currently. Her BP remains a limitation for dialysis but tolerated most of Wednesday session. 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 We are now holding her coumadin and gave PP vitamin K in case LP or other procedure is needed. Will cover with heparin as needed when INR is subtherapeutic. -Holding Coumadin -Lipitor  H/o SVT/Tachycardia: Holding Toprol-XL 25mg  in setting of active infection  Diet: Renal DVT ppx: Coumadin (held x2 day), Heparin when needed FULL CODE  Dispo: Disposition is deferred at this time, awaiting improvement of current medical problems. Anticipated discharge will depend on treatment plan after further evaluation.  The patient does have a current PCP Lin Landsman, MD) and does need an [facility] follow-up appointment after discharge.  The patient does not have transportation limitations that hinder transportation to clinic appointments.   LOS: 3 days   Collier Salina, MD 02/22/2015, 9:29 AM

## 2015-02-22 NOTE — Progress Notes (Signed)
Pt unable to eat breakfast, pocketing food in cheek. Asked patient to swallow and sweep her tongue side by side, pt not following commands. Food suctioned out. Md notified. Order for speech evaluation received. Patient NPO until further notice.

## 2015-02-22 NOTE — Progress Notes (Signed)
Upon assessment this am, pt remains ST 140's, pt A&Ox1 with delayed responses. MD notified and at bedside. Will continue to monitor.

## 2015-02-22 NOTE — Progress Notes (Signed)
Patients BP remains high DBP> 100, K. Melburn Hake was notified for BP , no furthers orders made will continue to watch and monitor patient.

## 2015-02-22 NOTE — Progress Notes (Signed)
ANTIBIOTIC CONSULT NOTE - FOLLOW UP  Pharmacy Consult for Unasyn Indication: UTI  Allergies  Allergen Reactions  . Food Swelling    Red peppers    Patient Measurements: Height: 5\' 3"  (160 cm) Weight: 183 lb 3.2 oz (83.1 kg) IBW/kg (Calculated) : 52.4  Vital Signs: Temp: 98.2 F (36.8 C) (11/25 0750) Temp Source: Oral (11/25 0750) BP: 144/96 mmHg (11/25 0750) Pulse Rate: 141 (11/25 0750) Intake/Output from previous day:   Intake/Output from this shift: Total I/O In: 120 [P.O.:120] Out: -   Labs:  Recent Labs  02/19/15 1255 02/19/15 1655 02/20/15 0337 02/21/15 0347  WBC 7.4 9.1 8.5 5.3  HGB 10.5* 10.7* 10.3* 8.8*  PLT 66* 183 156 115*  CREATININE 3.97*  --  4.68* 4.05*   Estimated Creatinine Clearance: 19 mL/min (by C-G formula based on Cr of 4.05). No results for input(s): VANCOTROUGH, VANCOPEAK, VANCORANDOM, GENTTROUGH, GENTPEAK, GENTRANDOM, TOBRATROUGH, TOBRAPEAK, TOBRARND, AMIKACINPEAK, AMIKACINTROU, AMIKACIN in the last 72 hours.   Microbiology: Recent Results (from the past 720 hour(s))  Culture, blood (routine x 2)     Status: None   Collection Time: 01/25/15  4:05 AM  Result Value Ref Range Status   Specimen Description BLOOD RIGHT HAND  Final   Special Requests BOTTLES DRAWN AEROBIC AND ANAEROBIC 5CC  Final   Culture NO GROWTH 5 DAYS  Final   Report Status 01/30/2015 FINAL  Final  Culture, blood (routine x 2)     Status: None   Collection Time: 01/25/15  4:34 AM  Result Value Ref Range Status   Specimen Description BLOOD LEFT ARM  Final   Special Requests BOTTLES DRAWN AEROBIC ONLY 10CC  Final   Culture  Setup Time   Final    GRAM NEGATIVE RODS AEROBIC BOTTLE ONLY CRITICAL RESULT CALLED TO, READ BACK BY AND VERIFIED WITH: S HERBERT,RN AT 1351 01/26/15 BY L BENFIELD    Culture   Final    PSEUDOMONAS AERUGINOSA SUSCEPTIBILITIES PERFORMED ON PREVIOUS CULTURE WITHIN THE LAST 5 DAYS.    Report Status 01/28/2015 FINAL  Final  Urine culture      Status: None   Collection Time: 01/25/15  5:13 AM  Result Value Ref Range Status   Specimen Description URINE, CATHETERIZED  Final   Special Requests NONE  Final   Culture MULTIPLE SPECIES PRESENT, SUGGEST RECOLLECTION  Final   Report Status 01/26/2015 FINAL  Final  Culture, blood (routine x 2)     Status: None   Collection Time: 01/25/15  1:40 PM  Result Value Ref Range Status   Specimen Description BLOOD RIGHT FEMORAL ARTERY  Final   Special Requests BOTTLES DRAWN AEROBIC AND ANAEROBIC 10CC  Final   Culture  Setup Time   Final    GRAM NEGATIVE RODS AEROBIC BOTTLE ONLY CRITICAL RESULT CALLED TO, READ BACK BY AND VERIFIED WITH: Wyn Quaker RN [id number] 01/25/15 A BROWNING    Culture PSEUDOMONAS AERUGINOSA  Final   Report Status 01/27/2015 FINAL  Final   Organism ID, Bacteria PSEUDOMONAS AERUGINOSA  Final      Susceptibility   Pseudomonas aeruginosa - MIC*    CEFTAZIDIME 4 SENSITIVE Sensitive     CIPROFLOXACIN 0.5 SENSITIVE Sensitive     GENTAMICIN <=1 SENSITIVE Sensitive     IMIPENEM 2 SENSITIVE Sensitive     PIP/TAZO 16 SENSITIVE Sensitive     CEFEPIME 8 SENSITIVE Sensitive     * PSEUDOMONAS AERUGINOSA  MRSA PCR Screening     Status: None   Collection Time: 01/25/15  8:35 PM  Result Value Ref Range Status   MRSA by PCR NEGATIVE NEGATIVE Final    Comment:        The GeneXpert MRSA Assay (FDA approved for NASAL specimens only), is one component of a comprehensive MRSA colonization surveillance program. It is not intended to diagnose MRSA infection nor to guide or monitor treatment for MRSA infections.   Cath Tip Culture     Status: None   Collection Time: 01/26/15  5:23 PM  Result Value Ref Range Status   Specimen Description CATH TIP RIGHT THIGH HEMODIALYSIS CATHETER  Final   Special Requests NONE  Final   Culture LT15 Performed at [facility]   Final   Report Status 01/30/2015 FINAL  Final  C difficile quick scan w PCR reflex     Status: None   Collection  Time: 01/27/15  9:46 AM  Result Value Ref Range Status   C Diff antigen NEGATIVE NEGATIVE Final   C Diff toxin NEGATIVE NEGATIVE Final   C Diff interpretation Negative for toxigenic C. difficile  Final  Culture, blood (routine x 2)     Status: None   Collection Time: 01/28/15  1:42 PM  Result Value Ref Range Status   Specimen Description BLOOD LEFT HAND  Final   Special Requests IN PEDIATRIC BOTTLE  3CC  Final   Culture NO GROWTH 5 DAYS  Final   Report Status 02/02/2015 FINAL  Final  C difficile quick scan w PCR reflex     Status: None   Collection Time: 02/12/15  4:35 PM  Result Value Ref Range Status   C Diff antigen NEGATIVE NEGATIVE Final   C Diff toxin NEGATIVE NEGATIVE Final   C Diff interpretation Negative for toxigenic C. difficile  Final  Culture, blood (routine x 2)     Status: None (Preliminary result)   Collection Time: 02/19/15  9:50 AM  Result Value Ref Range Status   Specimen Description BLOOD RIGHT ANTECUBITAL  Final   Special Requests BOTTLES DRAWN AEROBIC AND ANAEROBIC 5CCS  Final   Culture NO GROWTH 2 DAYS  Final   Report Status PENDING  Incomplete  Culture, blood (routine x 2)     Status: None (Preliminary result)   Collection Time: 02/19/15 10:00 AM  Result Value Ref Range Status   Specimen Description BLOOD RIGHT HAND  Final   Special Requests BOTTLES DRAWN AEROBIC AND ANAEROBIC 5CCS  Final   Culture NO GROWTH 2 DAYS  Final   Report Status PENDING  Incomplete  Urine culture     Status: None (Preliminary result)   Collection Time: 02/20/15 12:35 PM  Result Value Ref Range Status   Specimen Description URINE, CATHETERIZED  Final   Special Requests NONE  Final   Culture   Final    >=100,000 COLONIES/mL ENTEROCOCCUS SPECIES >=100,000 COLONIES/mL ACINETOBACTER CALCOACETICUS/BAUMANNII COMPLEX    Report Status PENDING  Incomplete   Organism ID, Bacteria ACINETOBACTER CALCOACETICUS/BAUMANNII COMPLEX  Final      Susceptibility   Acinetobacter  calcoaceticus/baumannii complex - MIC*    CEFTAZIDIME >=64 RESISTANT Resistant     CEFTRIAXONE >=64 RESISTANT Resistant     CIPROFLOXACIN >=4 RESISTANT Resistant     GENTAMICIN 2 SENSITIVE Sensitive     IMIPENEM 2 SENSITIVE Sensitive     PIP/TAZO 32 INTERMEDIATE Intermediate     TRIMETH/SULFA >=320 RESISTANT Resistant     AMPICILLIN/SULBACTAM 4 SENSITIVE Sensitive     * >=100,000 COLONIES/mL ACINETOBACTER CALCOACETICUS/BAUMANNII COMPLEX  Culture, blood (routine x  2)     Status: None (Preliminary result)   Collection Time: 02/20/15  3:30 PM  Result Value Ref Range Status   Specimen Description BLOOD VENOUS  Final   Special Requests BOTTLES DRAWN AEROBIC AND ANAEROBIC 10ML  Final   Culture NO GROWTH < 24 HOURS  Final   Report Status PENDING  Incomplete  Culture, blood (routine x 2)     Status: None (Preliminary result)   Collection Time: 02/20/15  4:16 PM  Result Value Ref Range Status   Specimen Description BLOOD ARTERIAL LINE  Final   Special Requests BOTTLES DRAWN AEROBIC AND ANAEROBIC 10ML  Final   Culture NO GROWTH < 24 HOURS  Final   Report Status PENDING  Incomplete    Anti-infectives    Start     Dose/Rate Route Frequency Ordered Stop   02/22/15 1800  Ampicillin-Sulbactam (UNASYN) 3 g in sodium chloride 0.9 % 100 mL IVPB     3 g 100 mL/hr over 60 Minutes Intravenous Every 24 hours 02/22/15 0909     02/20/15 1500  vancomycin (VANCOCIN) IVPB 1000 mg/200 mL premix     1,000 mg 200 mL/hr over 60 Minutes Intravenous To Hemodialysis 02/20/15 1412 02/20/15 1956   02/20/15 1430  hydroxychloroquine (PLAQUENIL) tablet 400 mg     400 mg Oral Daily 02/20/15 1425     02/19/15 1600  hydroxychloroquine (PLAQUENIL) tablet 400 mg  Status:  Discontinued     400 mg Oral Daily 02/19/15 1119 02/20/15 1425   02/19/15 1400  cefTAZidime (FORTAZ) 1 g in dextrose 5 % 50 mL IVPB  Status:  Discontinued     1 g 100 mL/hr over 30 Minutes Intravenous Every 24 hours 02/19/15 1248 02/22/15 0847    02/19/15 1400  vancomycin (VANCOCIN) 1,750 mg in sodium chloride 0.9 % 500 mL IVPB     1,750 mg 250 mL/hr over 120 Minutes Intravenous  Once 02/19/15 1248 02/19/15 1856      Assessment: 39 y/o female with ESRD on HD TTS transferred from rehab with confusion. She was initially started on vancomycin and ceftazidime for sepsis. Pharmacy now to transition to Unasyn for UTI. Of note, had recent treatment for Pseudomonas bacteremia.   Urine culture growing Acinetobacter calcoaceticus/baumannii complex and Enterococcus. Sensitivities are pending for Enterococcus but Acinetobacter is sensitive to Unasyn for now. Plan is for HD today (off schedule d/t holiday) so will give Unasyn post HD. Tmax is 100.5, WBC are normal.  Goal of Therapy:  Eradication of infection  Plan:  - Unasyn 3 g IV q24h given after HD - Follow-up urine culture sensitivities  [facility], Pharm.D., BCPS Clinical Pharmacist Pager: [phone] 02/22/2015 9:10 AM

## 2015-02-22 NOTE — Progress Notes (Signed)
Patient ID: [name], female   DOB: 11-May-1975, 39 y.o.   MRN: [id number] Vascular Surgery Progress Note  Subjective: l Thigh AVG ---/Infected  Objective:  Filed Vitals:   02/22/15 0750 02/22/15 1151  BP: 144/96   Pulse: 141   Temp: 98.2 F (36.8 C) 97.6 F (36.4 C)  Resp: 25     L thigh AVG working well Does not appear infected   Labs:  Recent Labs Lab 02/19/15 1255 02/20/15 0337 02/21/15 0347  CREATININE 3.97* 4.68* 4.05*    Recent Labs Lab 02/19/15 1255 02/20/15 0337 02/21/15 0347  NA 139 137 137  K 4.0 4.7 3.8  CL 100* 100* 100*  CO2 29 28 31   BUN 6 9 9   CREATININE 3.97* 4.68* 4.05*  GLUCOSE 107* 85 61*  CALCIUM 8.6* 8.6* 8.3*    Recent Labs Lab 02/19/15 1655 02/20/15 0337 02/21/15 0347  WBC 9.1 8.5 5.3  HGB 10.7* 10.3* 8.8*  HCT 35.4* 35.4* 29.6*  PLT 183 156 115*    Recent Labs Lab 02/20/15 0337 02/21/15 0347 02/22/15 0950  INR 3.34* 2.71* 2.06*    I/O last 3 completed shifts: In: 220 [P.O.:170; IV Piggyback:50] Out: -   Imaging: Dg Abd 1 View  02/21/2015  CLINICAL DATA:  Abdominal pain and diarrhea EXAM: ABDOMEN - 1 VIEW COMPARISON:  CT abdomen and pelvis January 14, 2015 FINDINGS: There is moderate stool in the colon. There is no bowel dilatation or air-fluid level suggesting obstruction. No free air is seen on this supine examination. There are surgical clips overlying the proximal left femur. IMPRESSION: No demonstrable obstruction or free air.  Moderate stool in colon. Electronically Signed   By: Lowella Grip III M.D.   On: 02/21/2015 19:42   US Renal  02/21/2015  CLINICAL DATA:  Flank pain EXAM: RENAL / URINARY TRACT ULTRASOUND COMPLETE COMPARISON:  CT abdomen and pelvis January 14, 2015 FINDINGS: Right Kidney: Length: 9.1 cm. Echogenicity and renal cortical thickness are within normal limits. No perinephric fluid or hydronephrosis visualized. There is a cyst arising from the upper pole of the right kidney measuring 1.1  x 1.3 x 1.2 cm. There is a second nearby cyst in the upper pole right kidney measuring 1.2 x 0.9 x 1.0 cm. No sonographically demonstrable calculus or ureterectasis. Left Kidney: Length: 9.6 cm. Echogenicity and renal cortical thickness are within normal limits. No mass, perinephric fluid, or hydronephrosis visualized. No sonographically demonstrable calculus or ureterectasis. Bladder: Appears normal for degree of bladder distention. IMPRESSION: Small right renal cysts.  Study otherwise unremarkable. Electronically Signed   By: Lowella Grip III M.D.   On: 02/21/2015 19:22    Assessment/Plan:    LOS: 3 days  s/p   AVG L thigh does not appear infected Will see again at your request   Tinnie Gens, MD 02/22/2015 12:23 PM

## 2015-02-22 NOTE — Evaluation (Signed)
Clinical/Bedside Swallow Evaluation Patient Details  Name: Aimi Casasanta MRN: [id number] Date of Birth: 23-Mar-1976  Today's Date: 02/22/2015 Time: SLP Start Time (ACUTE ONLY): 1001 SLP Stop Time (ACUTE ONLY): 1016 SLP Time Calculation (min) (ACUTE ONLY): 15 min  Past Medical History:  Past Medical History  Diagnosis Date  . Hypertension   . Cardiac arrhythmia   . SVT (supraventricular tachycardia) ([place])     "today, last week, 2 wk ago; maybe 1 month ago, etc; started w/in last 3-4 yrs"(07/20/2012)  . Fainting     "~ 1 month ago; probably related to SVT" (07/20/2012)  . Shortness of breath     "related to SVT episodes" (07/20/2012)  . Chest pain at rest     "related to SVT" (07/20/2012)  . Lupus (systemic lupus erythematosus) ([place])   . GERD (gastroesophageal reflux disease)   . Fibromyalgia   . Irritable bowel syndrome (IBS)   . Hypercholesterolemia   . Heart murmur     "small" (12/25/2014)  . TIA (transient ischemic attack) 12/21/2014  . Sleep apnea     "don't wear mask anymore" (12/25/2014)  . Anemia   . History of blood transfusion "several"    "related to low counts"  . Daily headache   . Arthritis     "hips" (12/25/2014)  . Anxiety     "sometimes; don't take anything for it" (12/25/2014)  . 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  Repeat biopsy early 2016 membranous w/o activity so meds weaned off. Big flare Aug '16 creat 6, 3rd biopsy DPGN, meds resumed. Ended up starting HD Sept 2016  . Stroke ([place])   . History of vascular access device     2016: Sept 9  R IJ cath per IR.  Sept 20 not candidate for fistula due to disease veins, had LUA hybrid graft placed. Sept 21 - steal syndrome, LUA AVG ligated. Oct 7 - new left femoral Gore-tex loop graft per VVS. Oct 29 - removal R IJ tunneled HD cath   Past Surgical History:  Past Surgical History  Procedure Laterality Date  .  Cesarean section  2001; 2010  . Cervical biopsy  2013  . Supraventricular tachycardia ablation  07/21/12     Dr. Cristopher Peru   . Peripheral vascular catheterization N/A 12/14/2014    Procedure: Upper Extremity Venography;  Surgeon: Elam Dutch, MD;  Location: [facility];  Service: Cardiovascular;  Laterality: N/A;  . Insertion hybrid anteriovenous gortex graft Left 12/18/2014    Procedure: INSERTION GORE HYBRID ARTERIOVENOUS GRAFT LEFT AXILLO-BRACHIAL.;  Surgeon: Serafina Mitchell, MD;  Location: [facility] OR;  Service: Vascular;  Laterality: Left;  . Ligation of arteriovenous  fistula Left 12/19/2014    Procedure: LIGATION OF FISTULA;  Surgeon: Elam Dutch, MD;  Location: [facility];  Service: Vascular;  Laterality: Left;  . Appendectomy  2011  . Tubal ligation  2010  . Tee without cardioversion N/A 12/25/2014    Procedure: TRANSESOPHAGEAL ECHOCARDIOGRAM (TEE);  Surgeon: Sueanne Margarita, MD;  Location: [facility];  Service: Cardiovascular;  Laterality: N/A;  . Av fistula placement Left 01/04/2015    Procedure: INSERTION OF ARTERIOVENOUS (AV) GORE-TEX GRAFT THIGH;  Surgeon: Rosetta Posner, MD;  Location: [facility];  Service: Vascular;  Laterality: Left;  . 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 She then developed pseudomonal bacteremia from a right femoral HD line which has been removed and she was treated with ceftaz through 02/09/15. She was doing well in CIR until she acutely developed severe confusion and hallucinations following an HD session. Found to have UTI. Has not been treated by SLP in history. No  documented history of dysphagia.    Assessment / Plan / Recommendation Clinical Impression  Pt demosntrates a moderate oral dysphagia secondary to cognitive impairment. Pt has intermittent focused to briefly sustained attention and delayed responses. With assist for self fed cup sips and spoonfulls of puree, oral function is normal and swallow is swift and strong without signs of aspiration. With more textured foods requiring mastication, pt has been observed to orally hold boluses, requiring suction. Mentation has waxed and waned. Given pts ability to swallow and self feed when presented with PO, recommend pt consume a puree diet and thin liquids when she is aware enough to self feed. Suspect total assist feeding may result in increased oral holding. Present pt with tray and provide hand over hand assist for self feeding and continue if pt participates. Discontinue meal if pt does not participate or begins holding. SLP will follow for tolerance. Ok to try meds crushed, suspect may need alternative means for meds.     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 She then developed pseudomonal bacteremia from a right femoral HD line which has been removed and she was treated with ceftaz through 02/09/15. She was doing well in CIR until she acutely developed severe confusion and  hallucinations following an HD session. Found to have UTI. Has not been treated by SLP in history. No documented history of dysphagia.  Type of Study: Bedside Swallow Evaluation Previous Swallow Assessment: none Diet Prior to this Study: NPO Temperature Spikes Noted: No Respiratory Status: Room air History of Recent Intubation: No Behavior/Cognition: Alert;Confused;Distractible;Requires cueing;Doesn't follow directions Oral Cavity Assessment: Other (comment) (pt will not open mouth fully, appears healthy) Oral Care Completed by SLP: No Oral Cavity - Dentition: Adequate natural dentition Vision: Functional for self-feeding Self-Feeding Abilities: Able to feed self;Needs assist Patient Positioning: Upright in bed Baseline Vocal Quality: Low vocal intensity Volitional Cough: Cognitively unable to elicit Volitional Swallow: Unable to elicit    Oral/Motor/Sensory Function Overall Oral Motor/Sensory Function: Other (comment) (pt does not follow commands, appears WNL)   Ice Chips Ice chips: Impaired Presentation: Spoon Oral Phase Impairments: Poor awareness of bolus Oral Phase Functional Implications: Oral holding   Thin Liquid Thin Liquid: Impaired Presentation: Cup;Self Fed;Straw Oral Phase Impairments: Poor awareness of  bolus Other Comments: Pt took cup sips automatically, could not initiate straw sip    Nectar Thick Nectar Thick Liquid: Not tested   Honey Thick Honey Thick Liquid: Not tested   Puree Puree: Within functional limits Presentation: Self Fed;Spoon   Solid Solid: Not tested      Herbie Baltimore, MA CCC-SLP [id number]  Gladie Gravette, Katherene Ponto 02/22/2015,10:25 AM

## 2015-02-22 NOTE — Progress Notes (Addendum)
MD at bedside. New orders received to hold seroquel. Made MD aware that nursing staff is holding PO meds because pt is lethargic and had issues swallowing earlier during the day. So seroquel, renal vitamin, and lopressor will be held tonight.

## 2015-02-22 NOTE — Progress Notes (Signed)
MD paged concerning patient's lactic acid results, also notified MD of pt HR still remaining 140's and respiration 30-40's. ABG ordered. Will continue to monitor.

## 2015-02-22 NOTE — Progress Notes (Signed)
Patient ID: [name], female   DOB: [birth year], 39 y.o.   MRN: [id number]  [facility] Progress Note    Subjective:   More lethargic this am.  Urine culture results noted.  Blood cultures still pending   Objective:   BP 144/96 mmHg  Pulse 141  Temp(Src) 98.2 F (36.8 C) (Oral)  Resp 25  Ht 5\' 3"  (1.6 m)  Wt 83.1 kg (183 lb 3.2 oz)  BMI 32.46 kg/m2  SpO2 99%  LMP 01/21/2015  Intake/Output: I/O last 3 completed shifts: In: 220 [P.O.:170; IV Piggyback:50] Out: -    Intake/Output this shift:  Total I/O In: 120 [P.O.:120] Out: -  Weight change: -1.3 kg (-2 lb 13.9 oz)  Physical Exam: Gen:WD [name] in NAD [id number] Resp:cta [id number] Ext:+edema left leg, L thigh AVG +T/B  Labs: BMET  Recent Labs Lab 02/16/15 1913 02/18/15 1615 02/19/15 1255 02/20/15 0337 02/21/15 0347  NA 137 135 139 137 137  K 3.4* 3.2* 4.0 4.7 3.8  CL 100* 100* 100* 100* 100*  CO2 28 26 29 28 31   GLUCOSE 73 93 107* 85 61*  BUN 9 9 6 9 9   CREATININE 5.24* 5.06* 3.97* 4.68* 4.05*  ALBUMIN 2.0* 1.9* 2.3*  --  1.7*  CALCIUM 8.9 8.5* 8.6* 8.6* 8.3*  PHOS 3.0 2.9 2.5  --  2.7   CBC  Recent Labs Lab 02/19/15 1255 02/19/15 1655 02/20/15 0337 02/21/15 0347  WBC 7.4 9.1 8.5 5.3  NEUTROABS 6.2  --  7.6  --   HGB 10.5* 10.7* 10.3* 8.8*  HCT 34.7* 35.4* 35.4* 29.6*  MCV 96.1 97.0 98.3 97.0  PLT 66* 183 156 115*   Specimen Description URINE, CATHETERIZED   Special Requests NONE   Culture >=100,000 COLONIES/mL ENTEROCOCCUS SPECIES  >=100,000 COLONIES/mL ACINETOBACTER CALCOACETICUS/BAUMANNII COMPLEX       Report Status PENDING   Organism ID, Bacteria ACINETOBACTER CALCOACETICUS/BAUMANNII COMPLEX   Resulting Agency [facility]    Culture & Susceptibility      ACINETOBACTER CALCOACETICUS/BAUMANNII COMPLEX     Antibiotic Sensitivity Microscan Status    AMPICILLIN/SULBACTAM Sensitive 4 SENSITIVE Final    Method: MIC    CEFTAZIDIME Resistant >=64  RESISTANT Final    Method: MIC    CEFTRIAXONE Resistant >=64 RESISTANT Final    Method: MIC    CIPROFLOXACIN Resistant >=4 RESISTANT Final    Method: MIC    GENTAMICIN Sensitive 2 SENSITIVE Final    Method: MIC    IMIPENEM Sensitive 2 SENSITIVE Final    Method: MIC    PIP/TAZO Intermediate 32 INTERMEDIATE Final    Method: MIC    TRIMETH/SULFA Resistant >=320 RESISTANT Final    Method: MIC    Comments ACINETOBACTER CALCOACETICUS/BAUMANNII COMPLEX (MIC)    >=100,000 COLONIES/mL ACINETOBACTER CALCOACETICUS/BAUMANNII COMPLEX                @IMGRELPRIORS @ Medications:    . atorvastatin  20 mg Oral q1800  . Darbepoetin Alfa  200 mcg Intravenous Q Thu-HD  . hydroxychloroquine  400 mg Oral Daily  . metoprolol tartrate  25 mg Oral BID  . midodrine  10 mg Oral BID WC  . multivitamin  1 tablet Oral QHS  . pantoprazole  20 mg Oral Daily  . QUEtiapine  25 mg Oral BID  . sodium chloride  3 mL Intravenous Q12H   Dialysis Orders: [facility] TTS 4 hr 400/800 EDW 87.5 2 K 2 Ca no profile right thigh TDC and left thigh graft (placed 10/7) Aranesp 200/week  heparin 2600 no labs no Fe or VDRA NO labs - hx prior steal of left arm graft s/p ligation 12/19/14  Assessment/ Plan:   1. Low grade Fever and AMS- possible delirium due to infection. Agree with pan cultures and empiric antibiotics. Will also check blood cultures from left fem AVG but no overt evidence of infection. 1. Multi-drug resistant UTI on appropriate abx now. 2. Consider ECHO to r/o SBE 2. Auditory hallucinations- ?psychosis from delirium or drug induced. Also need to r/o lupus cerebritis and consider LP.  1. Appreciate Neurology evaluation to help further evaluate her AMS. 2. possalby related to UTI will follow with appropriate abx 3. Enterococcus and acinetobacter UTI- possible urosepsis, now on Unasyn.  Await blood cultures 4. Pseudomonas catheter-related sepsis- completed fortaz 02/09/15, cath  removed 5. ESRD cont with TTS. plan for HD tomorrow and get back on outpt schedule.  6. 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 Nutrition: per primary 9. Hypotension: chronic, on midodrine 10mg  tid on HD days 10. 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 Vascular access- using left thigh AVG agree with VVS not to remove AVG at this time given limited access sites available.  12. Severe protein malnutrition- per primary svc 13. SLE- on prednisone 14. Dispo- discharge to home on hold due to AMS/fever/delirium and inpatient hospitalization. [name], MD [place] Pager [phone] 02/22/2015, 8:52 AM

## 2015-02-22 NOTE — Progress Notes (Signed)
Patient ID: [name], female   DOB: 1975/06/14, 39 y.o.   MRN: [id number]  Nurse [place] called me at 830 notifying me that [name] continues to be tachycardic and minimally responsive despite getting a 250cc bolus and 1mg  IV Dilaudid from Dr. Genene Churn two hours prior.  When I evaluated [name], she would follow commands by squeezing my finger but was completely non-responsive. When she squeezed my finger, I noticed her hands and arms were very rigid and resistant to motion. Her neck and legs were also quite rigid. Nurse [place] told me this was how she looked clinically during the day, and her mental status had not changed since getting the Dilaudid.  Given her autonomic instability, fever, muscular rigidity, and newly altered mental status, in the setting of getting quetiapine for the last several days, I questioned whether she may have neuroleptic malignant syndrome atop what appears to be sepsis from her urinary tract infection. We ordered a CK to further evaluate and held her quetiapine. Also, we could not confidently rule out a new stroke given her inability to perform a neurologic exam, so we obtained a stat head CT. A lumbar puncture should be considered tomorrow if she doesn't improve. Her status is tenuous and I will continue to be available overnight.  Loleta Chance, MD

## 2015-02-22 NOTE — Progress Notes (Signed)
Patient remains tachycardic, RR was in high 30's with on and off shallow breathing and tachypnea. Oxygen saturation maintained in 97-99% room air. With low grade temperature, remains alert and oriented x2 .  Dr. Raliegh Ip. Melburn Hake was notified of patients vital signs especially respiration  Will continue to monitor and if patient in respiratory distress to call and notify MD.

## 2015-02-22 NOTE — Consult Note (Signed)
Admission H&P    Chief Complaint: Altered mental status.  HPI: [name] is an 39 y.o. 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 Patient had improved in rehabilitation to the point of being ambulatory with assistance. Mental status changes were noted when she returned from dialysis on 02/18/2015. MRI of her brain showed no signs of recurrent acute stroke. Urine culture was positive for enterococcus sp. and gram-negative rods. She is currently on antibiotic therapy with Unasyn. Patient appeared to have improved yesterday with being oriented and coherent, but is much more confused today.  Past Medical History  Diagnosis Date  . Hypertension   . Cardiac arrhythmia   . SVT (supraventricular tachycardia) ([place])     "today, last week, 2 wk ago; maybe 1 month ago, etc; started w/in last 3-4 yrs"(07/20/2012)  . Fainting     "~ 1 month ago; probably related to SVT" (07/20/2012)  . Shortness of breath     "related to SVT episodes" (07/20/2012)  . Chest pain at rest     "related to SVT" (07/20/2012)  . Lupus (systemic lupus erythematosus) ([place])   . GERD (gastroesophageal reflux disease)   . Fibromyalgia   . Irritable bowel syndrome (IBS)   . Hypercholesterolemia   . Heart murmur     "small" (12/25/2014)  . TIA (transient ischemic attack) 12/21/2014  . Sleep apnea     "don't wear mask anymore" (12/25/2014)  . Anemia   . History of blood transfusion "several"    "related to low counts"  . Daily headache   . Arthritis     "hips" (12/25/2014)  . Anxiety     "sometimes; don't take anything for it" (12/25/2014)  . 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  Repeat biopsy early 2016 membranous w/o activity so meds weaned off. Big flare Aug '16 creat 6, 3rd biopsy DPGN, meds resumed. Ended up starting HD Sept 2016  . Stroke ([place])   . History of vascular access device     2016: Sept 9  R IJ cath per IR.  Sept 20 not candidate for fistula due to disease veins, had LUA hybrid graft placed. Sept 21 - steal syndrome, LUA AVG ligated. Oct 7 - new left femoral Gore-tex loop graft per [facility]. Oct 29 - removal R IJ tunneled HD cath    Past Surgical History  Procedure Laterality Date  . Cesarean section  2001; 2010  . Cervical biopsy  2013  . Supraventricular tachycardia ablation  07/21/12     Dr. Cristopher Peru   . Peripheral vascular catheterization N/A 12/14/2014    Procedure: Upper Extremity Venography;  Surgeon: Elam Dutch, MD;  Location: [facility];  Service: Cardiovascular;  Laterality: N/A;  . Insertion hybrid anteriovenous gortex graft Left 12/18/2014    Procedure: INSERTION GORE HYBRID ARTERIOVENOUS GRAFT LEFT AXILLO-BRACHIAL.;  Surgeon: Serafina Mitchell, MD;  Location: [facility];  Service: Vascular;  Laterality: Left;  . Ligation of arteriovenous  fistula Left 12/19/2014    Procedure: LIGATION OF FISTULA;  Surgeon: Elam Dutch, MD;  Location: [facility];  Service: Vascular;  Laterality: Left;  . Appendectomy  2011  . Tubal  ligation  2010  . Tee without cardioversion N/A 12/25/2014    Procedure: TRANSESOPHAGEAL ECHOCARDIOGRAM (TEE);  Surgeon: Sueanne Margarita, MD;  Location: [facility];  Service: Cardiovascular;  Laterality: N/A;  . Av fistula placement Left 01/04/2015    Procedure: INSERTION OF ARTERIOVENOUS (AV) GORE-TEX GRAFT THIGH;  Surgeon: Rosetta Posner, MD;  Location: [facility];  Service: Vascular;  Laterality: Left;  . Tee without cardioversion N/A 01/30/2015    Procedure: TRANSESOPHAGEAL ECHOCARDIOGRAM (TEE);  Surgeon: Lelon Perla, MD;  Location: [facility];  Service: Cardiovascular;  Laterality: N/A;    Family  History  Problem Relation Age of Onset  . Cancer Mother     breast and ovarian  . BRCA 1/2 Sister    Social History:  reports that she has never smoked. She has never used smokeless tobacco. She reports that she does not drink alcohol or use illicit drugs.  Allergies:  Allergies  Allergen Reactions  . Food Swelling    Red peppers    Medications Prior to Admission  Medication Sig Dispense Refill  . atorvastatin (LIPITOR) 20 MG tablet Take 1 tablet (20 mg total) by mouth daily at 6 PM. 30 tablet 2  . Calcium Carbonate Antacid (TUMS PO) Take 1-2 tablets by mouth daily as needed (heartburn).    . cefTAZidime 2 g in dextrose 5 % 50 mL Inject 2 g into the vein Every Tuesday,Thursday,and Saturday with dialysis.    Marland Kitchen cyclobenzaprine (FLEXERIL) 10 MG tablet Take 1 tablet (10 mg total) by mouth at bedtime as needed for muscle spasms. 30 tablet 0  . Darbepoetin Alfa (ARANESP) 200 MCG/0.4ML SOSY injection Inject 0.4 mLs (200 mcg total) into the vein every Thursday with hemodialysis. 1.68 mL   . hydroxychloroquine (PLAQUENIL) 200 MG tablet Take 2 tablets (400 mg total) by mouth daily. 60 tablet 1  . metoprolol succinate (TOPROL-XL) 25 MG 24 hr tablet Take 1 tablet (25 mg total) by mouth at bedtime. 30 tablet 1  . midodrine (PROAMATINE) 10 MG tablet Take 1 tablet (10 mg total) by mouth 2 (two) times daily with a meal. 60 tablet 0  . multivitamin (RENA-VIT) TABS tablet Take 1 tablet by mouth at bedtime. 30 tablet 0  . oxyCODONE-acetaminophen (PERCOCET) 5-325 MG tablet Take 1 tablet by mouth every 6 (six) hours as needed for severe pain. 90 tablet 0  . pantoprazole (PROTONIX) 20 MG tablet Take 1 tablet (20 mg total) by mouth daily. 30 tablet 1  . polyethylene glycol (MIRALAX / GLYCOLAX) packet Take 17 g by mouth daily as needed for mild constipation. 14 each 0  . trimethobenzamide (TIGAN) 300 MG capsule Take 1 capsule (300 mg total) by mouth every 8 (eight) hours as needed for nausea/vomiting. 20  capsule 0    ROS: Source: Chart review  General: Positive for fever and fatigue. Denies diaphoresis, appetite change.  Respiratory: Denies SOB, cough, and wheezing.  Cardiovascular: Positive for intermittent chest pain and palpitations.  Gastrointestinal: Positive for nausea. Denies vomiting, abdominal pain, and diarrhea Musculoskeletal: Denies myalgias, arthralgias, back pain, and gait problem.  Neurological: Positive for generalized weakness. Denies dizziness, syncope, lightheadedness, and headaches.  Psychiatric/Behavioral: Positive for mood changes. Denies sleep disturbance, and agitation.  Physical Examination: Blood pressure 144/96, pulse 141, temperature 98.2 F (36.8 C), temperature source Oral, resp. rate 25, height 5' 3"  (1.6 m), weight 83.1 kg (183 lb 3.2 oz), last menstrual period 01/21/2015, SpO2 99 %.  HEENT-  Normocephalic, no lesions, without obvious abnormality.  Normal external eye  and conjunctiva.  Normal TM's bilaterally.  Normal auditory canals and external ears. Normal external nose, mucus membranes and septum.  Normal pharynx. Neck supple with no masses, nodes, nodules or enlargement. Cardiovascular - regular rate and rhythm, S1, S2 normal, no murmur, click, rub or gallop Lungs - chest clear, no wheezing, rales, normal symmetric air entry Extremities - mild edema involving left lower extremity distally; more severe edema proximally  Neurologic Examination: Patient was alert and in no acute distress. Speech was slow and moderately slurred. She had moderate difficulty following simple commands. She was disoriented to time and place. Pupils were equal and reacted normally to light. Extraocular movements were full and conjugate on right left lateral gaze. Patient had gaze preference and more attentiveness to her left side than the right side. Visual fields were intact to confrontation bilaterally. Mild right lower facial weakness was noted. Motor exam showed  increased muscle tone throughout as well as mild diffuse weakness of upper and lower extremities with manual testing. Arms were kept in flexed position and legs extended. Deep tendon reflexes were diffusely hyperactive. Plantar responses were mute.  Results for orders placed or performed during the hospital encounter of 02/19/15 (from the past 48 hour(s))  Urine culture     Status: None (Preliminary result)   Collection Time: 02/20/15 12:35 PM  Result Value Ref Range   Specimen Description URINE, CATHETERIZED    Special Requests NONE    Culture      >=100,000 COLONIES/mL ENTEROCOCCUS SPECIES >=100,000 COLONIES/mL ACINETOBACTER CALCOACETICUS/BAUMANNII COMPLEX    Report Status PENDING    Organism ID, Bacteria ACINETOBACTER CALCOACETICUS/BAUMANNII COMPLEX       Susceptibility   Acinetobacter calcoaceticus/baumannii complex - MIC*    CEFTAZIDIME >=64 RESISTANT Resistant     CEFTRIAXONE >=64 RESISTANT Resistant     CIPROFLOXACIN >=4 RESISTANT Resistant     GENTAMICIN 2 SENSITIVE Sensitive     IMIPENEM 2 SENSITIVE Sensitive     PIP/TAZO 32 INTERMEDIATE Intermediate     TRIMETH/SULFA >=320 RESISTANT Resistant     AMPICILLIN/SULBACTAM 4 SENSITIVE Sensitive     * >=100,000 COLONIES/mL ACINETOBACTER CALCOACETICUS/BAUMANNII COMPLEX  Urinalysis, Routine w reflex microscopic (not at [facility])     Status: Abnormal   Collection Time: 02/20/15 12:35 PM  Result Value Ref Range   Color, Urine AMBER (A) YELLOW    Comment: BIOCHEMICALS MAY BE AFFECTED BY COLOR   APPearance TURBID (A) CLEAR   Specific Gravity, Urine 1.021 1.005 - 1.030   pH 6.0 5.0 - 8.0   Glucose, UA NEGATIVE NEGATIVE mg/dL   Hgb urine dipstick LARGE (A) NEGATIVE   Bilirubin Urine SMALL (A) NEGATIVE   Ketones, ur 15 (A) NEGATIVE mg/dL   Protein, ur >300 (A) NEGATIVE mg/dL   Nitrite NEGATIVE NEGATIVE   Leukocytes, UA LARGE (A) NEGATIVE  Urine microscopic-add on     Status: Abnormal   Collection Time: 02/20/15 12:35 PM  Result  Value Ref Range   Squamous Epithelial / LPF 6-30 (A) NONE SEEN    Comment: Please note change in reference range.   WBC, UA TOO NUMEROUS TO COUNT 0 - 5 WBC/hpf    Comment: Please note change in reference range.   RBC / HPF 6-30 0 - 5 RBC/hpf    Comment: Please note change in reference range.   Bacteria, UA MANY (A) NONE SEEN    Comment: Please note change in reference range.  Culture, blood (routine x 2)     Status: None (Preliminary result)   Collection  Time: 02/20/15  3:30 PM  Result Value Ref Range   Specimen Description BLOOD VENOUS    Special Requests BOTTLES DRAWN AEROBIC AND ANAEROBIC 10ML    Culture NO GROWTH < 24 HOURS    Report Status PENDING   Culture, blood (routine x 2)     Status: None (Preliminary result)   Collection Time: 02/20/15  4:16 PM  Result Value Ref Range   Specimen Description BLOOD ARTERIAL LINE    Special Requests BOTTLES DRAWN AEROBIC AND ANAEROBIC 10ML    Culture NO GROWTH < 24 HOURS    Report Status PENDING   Protime-INR     Status: Abnormal   Collection Time: 02/21/15  3:47 AM  Result Value Ref Range   Prothrombin Time 28.3 (H) 11.6 - 15.2 seconds   INR 2.71 (H) 0.00 - 1.49  CBC     Status: Abnormal   Collection Time: 02/21/15  3:47 AM  Result Value Ref Range   WBC 5.3 4.0 - 10.5 K/uL   RBC 3.05 (L) 3.87 - 5.11 MIL/uL   Hemoglobin 8.8 (L) 12.0 - 15.0 g/dL   HCT 29.6 (L) 36.0 - 46.0 %   MCV 97.0 78.0 - 100.0 fL   MCH 28.9 26.0 - 34.0 pg   MCHC 29.7 (L) 30.0 - 36.0 g/dL   RDW 17.6 (H) 11.5 - 15.5 %   Platelets 115 (L) 150 - 400 K/uL    Comment: PLATELET COUNT CONFIRMED BY SMEAR  Renal function panel     Status: Abnormal   Collection Time: 02/21/15  3:47 AM  Result Value Ref Range   Sodium 137 135 - 145 mmol/L   Potassium 3.8 3.5 - 5.1 mmol/L    Comment: DELTA CHECK NOTED   Chloride 100 (L) 101 - 111 mmol/L   CO2 31 22 - 32 mmol/L   Glucose, Bld 61 (L) 65 - 99 mg/dL   BUN 9 6 - 20 mg/dL   Creatinine, Ser 4.05 (H) 0.44 - 1.00 mg/dL    Calcium 8.3 (L) 8.9 - 10.3 mg/dL   Phosphorus 2.7 2.5 - 4.6 mg/dL   Albumin 1.7 (L) 3.5 - 5.0 g/dL   GFR calc non Af Amer 13 (L) >60 mL/min   GFR calc Af Amer 15 (L) >60 mL/min    Comment: (NOTE) The eGFR has been calculated using the CKD EPI equation. This calculation has not been validated in all clinical situations. eGFR's persistently <60 mL/min signify possible Chronic Kidney Disease.    Anion gap 6 5 - 15  Glucose, capillary     Status: Abnormal   Collection Time: 02/21/15 10:11 PM  Result Value Ref Range   Glucose-Capillary 125 (H) 65 - 99 mg/dL  Lactic acid, plasma     Status: Abnormal   Collection Time: 02/22/15  9:50 AM  Result Value Ref Range   Lactic Acid, Venous 2.3 (HH) 0.5 - 2.0 mmol/L    Comment: CRITICAL RESULT CALLED TO, READ BACK BY AND VERIFIED WITH: CAROL SCHILLER,RN AT 1036 02/22/15 BY ZBEECH.   Protime-INR     Status: Abnormal   Collection Time: 02/22/15  9:50 AM  Result Value Ref Range   Prothrombin Time 23.1 (H) 11.6 - 15.2 seconds   INR 2.06 (H) 0.00 - 1.49   Dg Abd 1 View  02/21/2015  CLINICAL DATA:  Abdominal pain and diarrhea EXAM: ABDOMEN - 1 VIEW COMPARISON:  CT abdomen and pelvis January 14, 2015 FINDINGS: There is moderate stool in the colon. There is no bowel  dilatation or air-fluid level suggesting obstruction. No free air is seen on this supine examination. There are surgical clips overlying the proximal left femur. IMPRESSION: No demonstrable obstruction or free air.  Moderate stool in colon. Electronically Signed   By: Lowella Grip III M.D.   On: 02/21/2015 19:42   US Renal  02/21/2015  CLINICAL DATA:  Flank pain EXAM: RENAL / URINARY TRACT ULTRASOUND COMPLETE COMPARISON:  CT abdomen and pelvis January 14, 2015 FINDINGS: Right Kidney: Length: 9.1 cm. Echogenicity and renal cortical thickness are within normal limits. No perinephric fluid or hydronephrosis visualized. There is a cyst arising from the upper pole of the right kidney measuring  1.1 x 1.3 x 1.2 cm. There is a second nearby cyst in the upper pole right kidney measuring 1.2 x 0.9 x 1.0 cm. No sonographically demonstrable calculus or ureterectasis. Left Kidney: Length: 9.6 cm. Echogenicity and renal cortical thickness are within normal limits. No mass, perinephric fluid, or hydronephrosis visualized. No sonographically demonstrable calculus or ureterectasis. Bladder: Appears normal for degree of bladder distention. IMPRESSION: Small right renal cysts.  Study otherwise unremarkable. Electronically Signed   By: Lowella Grip III M.D.   On: 02/21/2015 19:22    Assessment/Plan 39 year old lady with systemic lupus, end-stage renal disease and previous left MCA territory strokes with altered mental status in association with acute urinary tract infection. Patient has no clinical signs of recurrent acute stroke, nor signs of acute recurrent stroke on MRI study. Metal status changes most likely secondary to encephalopathy associated with acute UTI. Lupus cerebritis is less likely, but cannot be completely ruled out at this point.  Plan: 1. Will defer lumbar puncture, for now, but will reconsider if patient shows no significant improvement in mental status in the next 24-48 hours. 2. EEG, routine study today, to assess severity of encephalopathy, as well as to rule out possible focal seizure activity.  We will continue to follow this patient with you.  C.R. Nicole Kindred, MD [facility] [phone]  02/22/2015, 10:41 AM

## 2015-02-22 NOTE — Progress Notes (Signed)
PT Cancellation Note  Patient Details Name: [name] MRN: [id number] DOB: Sep 24, 1975   Cancelled Treatment:    Reason Eval/Treat Not Completed: Medical issues which prohibited therapy (RHR in 140's).  PT will continue to follow acutely.  Joslyn Hy PT, DPT [phone] Pager: [phone] 02/22/2015, 9:36 AM

## 2015-02-22 NOTE — Procedures (Signed)
ELECTROENCEPHALOGRAM REPORT  Patient: [name]       Room #: [id number] EEG No. ID: [id number] Age: 39 y.o.        Sex: female Referring Physician: Evette Doffing, D Report Date:  02/22/2015        Interpreting Physician: Anthony Sar  History: [name] is an 39 y.o. female with a history of end-stage renal disease on dialysis, systemic lupus and left MCA strokes, with new onset status change with confusion and agitation.  Indications for study:  Assess severity of encephalopathy; rule out focal seizure activity.  Technique: This is an 18 channel routine scalp EEG performed at the bedside with bipolar and monopolar montages arranged in accordance to the international 10/20 system of electrode placement.   Description: This EEG recording was performed during wakefulness. Patient was noted to be confused at the time of this study. Predominant activity consisted of next low to moderate amplitude diffuse irregular delta and theta activity. Photic stimulation was not performed. No epileptiform discharges were recorded.  Interpretation: This EEG is abnormal with moderately severe generalized nonspecific continuous slowing of cerebral activity. This pattern of slowing can be seen with metabolic and toxic encephalopathies, as well as with degenerative central nervous system disorders. No evidence of an epileptic disorder was demonstrated.   Rush Farmer M.D. [facility] [phone]

## 2015-02-22 NOTE — Progress Notes (Signed)
Spoke with Rapid Response RN about pt and updated him on pt's status.

## 2015-02-22 NOTE — Progress Notes (Signed)
Went to evaluate patient as RN was concerned about her Sinus Tach in 140's, her RR in 30-40's, and she appearing to have pain. Also having some worsening of her AMS.   Patient states she has pain "all over" when asked. Could not ilicit any other symptoms. She does follow commands minimally and was able to move her arms,squeeze my hand, and move her leg when asked. No gross neuro deficit but she is less talkative than usual per RN and her mother.  Is in sinus tach 140's. BP in 140/90. I reviewed ABG result, consistent with Acute Respiratory Alkolosis likely from fever/pain causing tachypnea.  She was started on Unasyn today to cover for acinetobacter UTI.   - continue abx - try fluid 250cc bolus to see if that helps her tachycardia with caution as she is ESRD patient. - tylenol rectally as patient's mental status makes PO problematic at this time. - dilaudid 1x for pain.  Will ask night team to re-evaluate her.

## 2015-02-22 NOTE — Progress Notes (Signed)
Bedside EEG completed, results pending. 

## 2015-02-23 DIAGNOSIS — R1030 Lower abdominal pain, unspecified: Secondary | ICD-10-CM | POA: Insufficient documentation

## 2015-02-23 LAB — CBC
HCT: 36.3 % (ref 36.0–46.0)
HEMOGLOBIN: 11.1 g/dL — AB (ref 12.0–15.0)
MCH: 28.7 pg (ref 26.0–34.0)
MCHC: 30.6 g/dL (ref 30.0–36.0)
MCV: 93.8 fL (ref 78.0–100.0)
PLATELETS: 163 10*3/uL (ref 150–400)
RBC: 3.87 MIL/uL (ref 3.87–5.11)
RDW: 17.1 % — AB (ref 11.5–15.5)
WBC: 18 10*3/uL — AB (ref 4.0–10.5)

## 2015-02-23 LAB — RENAL FUNCTION PANEL
ALBUMIN: 1.6 g/dL — AB (ref 3.5–5.0)
ANION GAP: 9 (ref 5–15)
BUN: 16 mg/dL (ref 6–20)
CO2: 26 mmol/L (ref 22–32)
Calcium: 8.3 mg/dL — ABNORMAL LOW (ref 8.9–10.3)
Chloride: 102 mmol/L (ref 101–111)
Creatinine, Ser: 4.55 mg/dL — ABNORMAL HIGH (ref 0.44–1.00)
GFR calc Af Amer: 13 mL/min — ABNORMAL LOW (ref >60)
GFR, EST NON AFRICAN AMERICAN: 11 mL/min — AB (ref >60)
Glucose, Bld: 96 mg/dL (ref 65–99)
PHOSPHORUS: 2.8 mg/dL (ref 2.5–4.6)
POTASSIUM: 3.5 mmol/L (ref 3.5–5.1)
Sodium: 137 mmol/L (ref 135–145)

## 2015-02-23 LAB — PROTIME-INR
INR: 2.32 — ABNORMAL HIGH (ref 0.00–1.49)
INR: 2.14 — ABNORMAL HIGH (ref 0.00–1.49)
PROTHROMBIN TIME: 25.3 s — AB (ref 11.6–15.2)
PROTHROMBIN TIME: 23.8 s — AB (ref 11.6–15.2)

## 2015-02-23 LAB — LACTIC ACID, PLASMA: Lactic Acid, Venous: 3.6 mmol/L (ref 0.5–2.0)

## 2015-02-23 LAB — HEPARIN LEVEL (UNFRACTIONATED): Heparin Unfractionated: 0.56 IU/mL (ref 0.30–0.70)

## 2015-02-23 MED ORDER — HYDROMORPHONE HCL 1 MG/ML IJ SOLN
1.0000 mg | INTRAMUSCULAR | Status: DC | PRN
  Administered 2015-02-23 – 2015-02-27 (×3): 1 mg via INTRAVENOUS
  Filled 2015-02-23 (×3): qty 1

## 2015-02-23 MED ORDER — HYDROMORPHONE HCL 1 MG/ML IJ SOLN
1.0000 mg | Freq: Once | INTRAMUSCULAR | Status: AC
  Administered 2015-02-23: 1 mg via INTRAVENOUS
  Filled 2015-02-23: qty 1

## 2015-02-23 MED ORDER — DARBEPOETIN ALFA 200 MCG/0.4ML IJ SOSY: 200.0000 ug | PREFILLED_SYRINGE | INTRAMUSCULAR | Status: DC

## 2015-02-23 MED ORDER — HALOPERIDOL LACTATE 5 MG/ML IJ SOLN
1.0000 mg | Freq: Four times a day (QID) | INTRAMUSCULAR | Status: DC | PRN
Start: 2015-02-23 — End: 2015-02-24

## 2015-02-23 MED ORDER — HEPARIN SODIUM (PORCINE) 1000 UNIT/ML DIALYSIS
20.0000 [IU]/kg | INTRAMUSCULAR | Status: DC | PRN
  Filled 2015-02-23: qty 2

## 2015-02-23 MED ORDER — PANTOPRAZOLE SODIUM 40 MG IV SOLR
40.0000 mg | INTRAVENOUS | Status: DC
  Administered 2015-02-23 – 2015-02-24 (×2): 40 mg via INTRAVENOUS
  Filled 2015-02-23 (×4): qty 40

## 2015-02-23 MED ORDER — ALBUMIN HUMAN 25 % IV SOLN
INTRAVENOUS | Status: AC
  Administered 2015-02-23: 12.5 g
  Filled 2015-02-23: qty 50

## 2015-02-23 MED ORDER — SODIUM CHLORIDE 0.9 % IV BOLUS (SEPSIS)
250.0000 mL | Freq: Once | INTRAVENOUS | Status: AC
  Administered 2015-02-23: 250 mL via INTRAVENOUS

## 2015-02-23 MED ORDER — METOPROLOL TARTRATE 1 MG/ML IV SOLN
5.0000 mg | Freq: Once | INTRAVENOUS | Status: AC
  Administered 2015-02-23: 5 mg via INTRAVENOUS
  Filled 2015-02-23: qty 5

## 2015-02-23 NOTE — Progress Notes (Signed)
I re-evaluated [name] this afternoon after her dialysis session and she is doing much better. She is alert and oriented to person, place, and time (year/month). She was able to tell me her kid's names. She complains of pain in her lower abdomen and the bottom of her left heel. She reports feeling anxious because she does not understand everything that has been happening. Her mother was present and told me that [name] was paranoid and telling her not to go outside the room because "they will get you."   Physical Exam: General: alert, sitting up, speaking in sentences, anxious but less than this morning CV: tachycardic in 140s, no m/g/r Pulm: distant breath sounds anteriorly Abd: BS+, soft, obese, mild tenderness to suprapubic region Ext: Left thigh near graft is warmer compared to her right leg and lower left leg. She has 2-3+ edema in her left thigh and 1+ in her distal left leg. No erythema or ulcers present on left heel. Mild tenderness to palpation of left thigh and left heel.  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 - Continue Unasyn (11/25>>) - f/u blood cultures - lactic acid elevated at 3.6, got fluid bolus in HD - Control pain with Dilaudid 1 mg Q4H PRN. Hopefully this will help her HR as well - Can give Metoprolol 5 mg IV if HR > 160 - Would only give Haldol 1 mg if severely agitated  [name], MD, MPH Internal Medicine Resident, PGY-II Pager: [phone]

## 2015-02-23 NOTE — Progress Notes (Addendum)
ANTICOAGULATION CONSULT NOTE - Follow Up Consult  Pharmacy Consult for Heparin (warfarin on hold) Indication: Hx SLE/hypercoagulable state  Allergies  Allergen Reactions  . Food Swelling    Red peppers   Patient Measurements: Height: 5\' 3"  (160 cm) Weight: 183 lb 3.2 oz (83.1 kg) IBW/kg (Calculated) : 52.4  Vital Signs: Temp: 98.9 F (37.2 C) (11/25 2317) Temp Source: Oral (11/25 2317) BP: 122/79 mmHg (11/25 2317) Pulse Rate: 143 (11/25 2317)  Labs:  Recent Labs  02/20/15 0337 02/21/15 0347 02/22/15 0950 02/22/15 2244 02/23/15 0239  HGB 10.3* 8.8*  --   --  11.1*  HCT 35.4* 29.6*  --   --  36.3  PLT 156 115*  --   --  163  LABPROT 33.2* 28.3* 23.1*  --   --   INR 3.34* 2.71* 2.06*  --   --   HEPARINUNFRC  --   --   --   --  0.56  CREATININE 4.68* 4.05*  --   --   --   CKTOTAL  --   --   --  14*  --     Estimated Creatinine Clearance: 19 mL/min (by C-G formula based on Cr of 4.05).   Assessment: Order to start heparin when INR was less than 2. Heparin was started on the evening of 11/25 with the assumption that the INR would trend to <2. INR is up to 2.32 this AM.   Goal of Therapy:  Heparin level 0.3-0.5 units/ml Monitor platelets by anticoagulation protocol: Yes   Plan:  -Hold heparin  -Re-check INR ~12 hours from now -Re-start heparin drip when INR is less than 2  Ledford, James 02/23/2015,3:14 AM  Addendum: INR 2.14. Recheck INR in am

## 2015-02-23 NOTE — Progress Notes (Signed)
Subjective: Patient continued to be tachycardic in the 140s, tachypneic, and agitated yesterday afternoon and last night. She was given a 250 ml NS bolus but this did not help her tachycardia. ABG consistent with an acute respiratory alkalosis likely secondary to pain. Night team also evaluated her and found her to be completely non-responsive with muscle rigidity and fever. Given the concern for possible NMS her seroquel was discontinued. CK was checked and was normal. A CT head was obtained that was negative for acute stroke.   This morning patient is able to follow simple commands. She was able to follow my finger with her eyes and she tried to lift both of her upper extremities off the bed but was minimally able to. She is able to mouth or make just barely audible one word responses. She reports pain but is unable to tell me where. Remains tachycardic in the 140s and tachypneic.   Objective: Vital signs in last 24 hours: Filed Vitals:   02/22/15 1939 02/22/15 2126 02/22/15 2317 02/23/15 0352  BP: 137/98 114/66 122/79 131/90  Pulse: 143 145 143 143  Temp: 100.3 F (37.9 C)  98.9 F (37.2 C) 98.6 F (37 C)  TempSrc: Oral  Oral Oral  Resp: 42 30 35 35  Height:      Weight:    182 lb 15.7 oz (83 kg)  SpO2: 100% 99% 98% 100%   Weight change: -3.5 oz (-0.1 kg)  Intake/Output Summary (Last 24 hours) at 02/23/15 [id number] Last data filed at 02/23/15 0358  Gross per 24 hour  Intake 329.63 ml  Output      0 ml  Net 329.63 ml   General: obese, ill appearing woman who appears anxious, only able to answer in one word answers that are either mouthed or barely audible HEENT: [place]/AT, EOMI, mucus membranes moist CV: tachycardic in the 140s, no m/g/r Pulm: distant lung sounds, tachypneic but able to move good volumes of air Abd: BS+, soft, obese, does not seem tender in the lower abd/suprapubic region this morning but difficult to gauge  Ext: 2-3+ edema of the left thigh with mild tenderness to  palpation near the graft site. 1+ distal LLE edema. Right leg appears normal.  Neuro: Able to state her name. Follows simple commands. EOMI, able to follow my finger. Lifts her extremities off the bed only a few centimeters.  Psych- Anxious, exaggerated startle, extremely soft speech, delayed responsiveness  Lab Results: Basic Metabolic Panel:  Recent Labs Lab 02/19/15 1255 02/20/15 0337 02/21/15 0347  NA 139 137 137  K 4.0 4.7 3.8  CL 100* 100* 100*  CO2 29 28 31   GLUCOSE 107* 85 61*  BUN 6 9 9   CREATININE 3.97* 4.68* 4.05*  CALCIUM 8.6* 8.6* 8.3*  MG 1.8  --   --   PHOS 2.5  --  2.7   Liver Function Tests:  Recent Labs Lab 02/19/15 1255 02/21/15 0347  AST 32  --   ALT 18  --   ALKPHOS 101  --   BILITOT 0.7  --   PROT 5.7*  --   ALBUMIN 2.3* 1.7*  CBC:  Recent Labs Lab 02/19/15 1255  02/20/15 0337 02/21/15 0347 02/23/15 0239  WBC 7.4  < > 8.5 5.3 18.0*  NEUTROABS 6.2  --  7.6  --   --   HGB 10.5*  < > 10.3* 8.8* 11.1*  HCT 34.7*  < > 35.4* 29.6* 36.3  MCV 96.1  < > 98.3 97.0 93.8  PLT 66*  < > 156 115* 163  < > = values in this interval not displayed. CBG:  Recent Labs Lab 02/19/15 0233 02/21/15 2211  GLUCAP 72 125*   Thyroid Function Tests:  Recent Labs Lab 02/19/15 1255  TSH 4.157   Coagulation:  Recent Labs Lab 02/20/15 0337 02/21/15 0347 02/22/15 0950 02/23/15 0239  LABPROT 33.2* 28.3* 23.1* 25.3*  INR 3.34* 2.71* 2.06* 2.32*   Urine Drug Screen: Drugs of Abuse     Component Value Date/Time   LABOPIA NONE DETECTED 07/21/2012 0215   COCAINSCRNUR NONE DETECTED 07/21/2012 0215   LABBENZ NONE DETECTED 07/21/2012 0215   AMPHETMU NONE DETECTED 07/21/2012 0215   THCU NONE DETECTED 07/21/2012 0215   LABBARB NONE DETECTED 07/21/2012 0215     Micro Results: Recent Results (from the past 240 hour(s))  Culture, blood (routine x 2)     Status: None (Preliminary result)   Collection Time: 02/19/15  9:50 AM  Result Value Ref Range  Status   Specimen Description BLOOD RIGHT ANTECUBITAL  Final   Special Requests BOTTLES DRAWN AEROBIC AND ANAEROBIC 5CCS  Final   Culture NO GROWTH 3 DAYS  Final   Report Status PENDING  Incomplete  Culture, blood (routine x 2)     Status: None (Preliminary result)   Collection Time: 02/19/15 10:00 AM  Result Value Ref Range Status   Specimen Description BLOOD RIGHT HAND  Final   Special Requests BOTTLES DRAWN AEROBIC AND ANAEROBIC 5CCS  Final   Culture NO GROWTH 3 DAYS  Final   Report Status PENDING  Incomplete  Urine culture     Status: None (Preliminary result)   Collection Time: 02/20/15 12:35 PM  Result Value Ref Range Status   Specimen Description URINE, CATHETERIZED  Final   Special Requests NONE  Final   Culture   Final    >=100,000 COLONIES/mL ENTEROCOCCUS SPECIES >=100,000 COLONIES/mL ACINETOBACTER CALCOACETICUS/BAUMANNII COMPLEX    Report Status PENDING  Incomplete   Organism ID, Bacteria ACINETOBACTER CALCOACETICUS/BAUMANNII COMPLEX  Final      Susceptibility   Acinetobacter calcoaceticus/baumannii complex - MIC*    CEFTAZIDIME >=64 RESISTANT Resistant     CEFTRIAXONE >=64 RESISTANT Resistant     CIPROFLOXACIN >=4 RESISTANT Resistant     GENTAMICIN 2 SENSITIVE Sensitive     IMIPENEM 2 SENSITIVE Sensitive     PIP/TAZO 32 INTERMEDIATE Intermediate     TRIMETH/SULFA >=320 RESISTANT Resistant     AMPICILLIN/SULBACTAM 4 SENSITIVE Sensitive     * >=100,000 COLONIES/mL ACINETOBACTER CALCOACETICUS/BAUMANNII COMPLEX  Culture, blood (routine x 2)     Status: None (Preliminary result)   Collection Time: 02/20/15  3:30 PM  Result Value Ref Range Status   Specimen Description BLOOD VENOUS  Final   Special Requests BOTTLES DRAWN AEROBIC AND ANAEROBIC 10ML  Final   Culture NO GROWTH 2 DAYS  Final   Report Status PENDING  Incomplete  Culture, blood (routine x 2)     Status: None (Preliminary result)   Collection Time: 02/20/15  4:16 PM  Result Value Ref Range Status   Specimen  Description BLOOD ARTERIAL LINE  Final   Special Requests BOTTLES DRAWN AEROBIC AND ANAEROBIC 10ML  Final   Culture NO GROWTH 2 DAYS  Final   Report Status PENDING  Incomplete  Culture, blood (routine x 2)     Status: None (Preliminary result)   Collection Time: 02/21/15  8:36 PM  Result Value Ref Range Status   Specimen Description BLOOD RIGHT HAND  Final  Special Requests IN PEDIATRIC BOTTLE 3CC  Final   Culture NO GROWTH < 24 HOURS  Final   Report Status PENDING  Incomplete  Culture, blood (routine x 2)     Status: None (Preliminary result)   Collection Time: 02/21/15  9:21 PM  Result Value Ref Range Status   Specimen Description BLOOD RIGHT THUMB  Final   Special Requests IN PEDIATRIC BOTTLE 1.5CC  Final   Culture NO GROWTH < 24 HOURS  Final   Report Status PENDING  Incomplete   Studies/Results: Dg Abd 1 View  02/21/2015  CLINICAL DATA:  Abdominal pain and diarrhea EXAM: ABDOMEN - 1 VIEW COMPARISON:  CT abdomen and pelvis January 14, 2015 FINDINGS: There is moderate stool in the colon. There is no bowel dilatation or air-fluid level suggesting obstruction. No free air is seen on this supine examination. There are surgical clips overlying the proximal left femur. IMPRESSION: No demonstrable obstruction or free air.  Moderate stool in colon. Electronically Signed   By: Lowella Grip III M.D.   On: 02/21/2015 19:42   Ct Head Wo Contrast  02/22/2015  CLINICAL DATA:  Diffuse body pain, acute onset.  Initial encounter. EXAM: CT HEAD WITHOUT CONTRAST TECHNIQUE: Contiguous axial images were obtained from the base of the skull through the vertex without intravenous contrast. COMPARISON:  CT of the head performed 02/19/2015, and MRI of the brain performed 02/20/2015 FINDINGS: There is an evolving subacute to chronic right MCA territory infarct, as previously noted. A tiny focus of adjacent calcification is unchanged in appearance. The posterior fossa, including the cerebellum, brainstem and  fourth ventricle, is within normal limits. The third and lateral ventricles, and basal ganglia are unremarkable in appearance. No midline shift is seen. There is no evidence of fracture; visualized osseous structures are unremarkable in appearance. The visualized portions of the orbits are within normal limits. The paranasal sinuses and mastoid air cells are well-aerated. No significant soft tissue abnormalities are seen. IMPRESSION: 1. No acute intracranial abnormality seen on CT. 2. Evolving subacute to chronic right MCA territory infarct again noted, relatively unchanged in appearance. Electronically Signed   By: Garald Balding M.D.   On: 02/22/2015 22:13   US Renal  02/21/2015  CLINICAL DATA:  Flank pain EXAM: RENAL / URINARY TRACT ULTRASOUND COMPLETE COMPARISON:  CT abdomen and pelvis January 14, 2015 FINDINGS: Right Kidney: Length: 9.1 cm. Echogenicity and renal cortical thickness are within normal limits. No perinephric fluid or hydronephrosis visualized. There is a cyst arising from the upper pole of the right kidney measuring 1.1 x 1.3 x 1.2 cm. There is a second nearby cyst in the upper pole right kidney measuring 1.2 x 0.9 x 1.0 cm. No sonographically demonstrable calculus or ureterectasis. Left Kidney: Length: 9.6 cm. Echogenicity and renal cortical thickness are within normal limits. No mass, perinephric fluid, or hydronephrosis visualized. No sonographically demonstrable calculus or ureterectasis. Bladder: Appears normal for degree of bladder distention. IMPRESSION: Small right renal cysts.  Study otherwise unremarkable. Electronically Signed   By: Lowella Grip III M.D.   On: 02/21/2015 19:22   Medications: I have reviewed the patient's current medications. Scheduled Meds: . ampicillin-sulbactam (UNASYN) IV  3 g Intravenous Q24H  . atorvastatin  20 mg Oral q1800  . Darbepoetin Alfa  200 mcg Intravenous Q Thu-HD  . metoprolol tartrate  25 mg Oral BID  . midodrine  10 mg Oral BID WC  .  multivitamin  1 tablet Oral QHS  . pantoprazole  20 mg  Oral Daily  . sodium chloride  3 mL Intravenous Q12H   Continuous Infusions:  PRN Meds:.acetaminophen, acetaminophen, ondansetron **OR** ondansetron (ZOFRAN) IV, polyethylene glycol Assessment/Plan:  Acute delirium 2/2 sepsis: Her AMS is stable and slightly improved from yesterday. She is able to follow simple commands but is still not verbal. Urine cx growing acinetobacter baumanii. Changed to Unasyn 11/25. Hopefully her AMS will improve as her UTI is appropriately treated. If not, we may need to consider LP in the next 24 hours. I do not think she has NMS given her CK is normal (actually on low side) and would expect significantly elevated CK. 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  I have ordered a repeat lactic acid to see if it is trending up. Will give her small NS bolus and Metoprolol 5 mg IV x 1 to see if this helps her tachycardia. Likely that her underlying tachycardia is related to her sepsis.  - Continue Unasyn (11/25>>>) - f/u blood cultures>> NGTD - f/u Lactic acid - NS 250 ml bolus x 1 and Metoprolol 5 mg IV x 1 to help with tachycardia - Dilaudid 1 mg IV x 1 for pain -Tylenol prn for fever - Seroquel discontinued. Not agitated currently. Will need to consider other options for agitation control.  - Neuro consulted, appreciate recommendations - f/u EEG  Left thigh edema: Ultrasound demonstrated a large surrounding fluid collection of indeterminate nature, without strong exam evidence of inflammation, no current plan to aspirate. She does have tenderness in this area. Vascular surgery evaluated her on 11/25 and do not believe the graft is infected.  - F/U HD graft Cx sample - Continuing Unasyn as above  SLE: C3 low at 47, total complement and C4 WNL. Neuro consulted. Difficult to rule lupus cerebritis in or out during active infection. - Continue Plaquenil- I wonder  if this is contributing to her AMS?  ESRD on HD:  MWF schedule currently. Her BP remains a limitation for dialysis but tolerated most of Wednesday session. 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 We are now holding her coumadin and gave PP vitamin K in case LP or other procedure is needed. Will cover with heparin as needed when INR is subtherapeutic. - Holding Coumadin - Heparin per pharmacy  H/o SVT/Tachycardia:  - Patient unable to take PO, holding PO Lopressor - Will give Metoprolol 5 mg IV x 1 and assess her response   Diet: Dysphagia 1 DVT ppx: Coumadin (held x 3 days), Heparin when needed FULL CODE Dispo: Disposition is deferred at this time, awaiting improvement of current medical problems. Anticipated discharge will depend on treatment plan after further evaluation.  The patient does have a current PCP Lin Landsman, MD) and does need an [facility] hospital follow-up appointment after discharge.  The patient does not have transportation limitations that hinder transportation to clinic appointments.   LOS: 4 days   [name], MD, MPH Internal Medicine Resident, PGY-II Pager: [phone] 02/23/2015, 7:13 AM

## 2015-02-23 NOTE — Progress Notes (Signed)
Subjective: Patient has been experiencing intermittent abdominal pain with increases in pulse and respiratory rate. She has responded well to Dilaudid. She spiked temperature. Repeat CT scan of her head last night did not show recurrent stroke.  Objective: Current vital signs: BP 71/58 mmHg  Pulse 98  Temp(Src) 98.1 F (36.7 C) (Oral)  Resp 13  Ht 5\' 3"  (1.6 m)  Wt 83.6 kg (184 lb 4.9 oz)  BMI 32.66 kg/m2  SpO2 100%  LMP 01/21/2015  Neurologic Exam: Patient had received Dilaudid just prior to this evaluation. She was still awake and alert. She was able to follow simple commands. Speech was essentially inaudible and slurred. Muscle tone was increased throughout. She was able to move all extremities voluntarily, however.  Medications: I have reviewed the patient's current medications.  Assessment/Plan: 39 year old lady with history of left MCA strokes, admitted from inpatient rehabilitation with acute encephalopathy initiated with acute urinary tract infection and possible sepsis. Recurrent stroke has been demonstrated. Mental status has improved slightly over the past 24 hours.  Recommend no changes in current management. I agree with discontinuing Seroquel.  We will continue to follow this patient with you.  C.R. Nicole Kindred, MD [facility] [phone]  02/23/2015  2:06 PM

## 2015-02-23 NOTE — Progress Notes (Signed)
Patient ID: [name], female   DOB: October 16, 1975, 39 y.o.   MRN: [id number]  [facility] Progress Note    Subjective:   Flat affect this am and only able to answer in whispers   Objective:   BP 133/98 mmHg  Pulse 144  Temp(Src) 98.2 F (36.8 C) (Oral)  Resp 39  Ht 5\' 3"  (1.6 m)  Wt 83 kg (182 lb 15.7 oz)  BMI 32.42 kg/m2  SpO2 100%  LMP 01/21/2015  Intake/Output: I/O last 3 completed shifts: In: 329.6 [P.O.:120; I.V.:109.6; IV Piggyback:100] Out: -    Intake/Output this shift:    Weight change: -0.1 kg (-3.5 oz)  Physical Exam: Gen:WD [name] flushed flat affect, NAD [id number] no rub Resp:cta [id number] Ext:1+ edema of left leg, left thigh AVG +T/B, no purulent drainage  Labs: BMET  Recent Labs Lab 02/16/15 1913 02/18/15 1615 02/19/15 1255 02/20/15 0337 02/21/15 0347  NA 137 135 139 137 137  K 3.4* 3.2* 4.0 4.7 3.8  CL 100* 100* 100* 100* 100*  CO2 28 26 29 28 31   GLUCOSE 73 93 107* 85 61*  BUN 9 9 6 9 9   CREATININE 5.24* 5.06* 3.97* 4.68* 4.05*  ALBUMIN 2.0* 1.9* 2.3*  --  1.7*  CALCIUM 8.9 8.5* 8.6* 8.6* 8.3*  PHOS 3.0 2.9 2.5  --  2.7   CBC  Recent Labs Lab 02/19/15 1255 02/19/15 1655 02/20/15 0337 02/21/15 0347 02/23/15 0239  WBC 7.4 9.1 8.5 5.3 18.0*  NEUTROABS 6.2  --  7.6  --   --   HGB 10.5* 10.7* 10.3* 8.8* 11.1*  HCT 34.7* 35.4* 35.4* 29.6* 36.3  MCV 96.1 97.0 98.3 97.0 93.8  PLT 66* 183 156 115* 163    @IMGRELPRIORS @ Medications:    . ampicillin-sulbactam (UNASYN) IV  3 g Intravenous Q24H  . Darbepoetin Alfa  200 mcg Intravenous Q Thu-HD  . midodrine  10 mg Oral BID WC  . pantoprazole (PROTONIX) IV  40 mg Intravenous Q24H  . sodium chloride  3 mL Intravenous Q12H   Dialysis Orders: [facility] TTS 4 hr 400/800 EDW 87.5 2 K 2 Ca no profile right thigh TDC and left thigh graft (placed 10/7) Aranesp 200/week heparin 2600 no labs no Fe or VDRA NO labs - hx prior steal of left arm graft s/p ligation  12/19/14  Assessment/ Plan:   1. Low grade Fever and AMS- possible delirium due to infection. Agree with pan cultures and empiric antibiotics. Will also check blood cultures from left fem AVG but no overt evidence of infection. 1. Multi-drug resistant UTI on appropriate abx now. 2. Consider ECHO to r/o SBE 3. Agree with stopping seroquel, CPK's normal so NMS less likely.  Neuro following 2. Auditory hallucinations- ?psychosis from delirium or drug induced. Also need to r/o lupus cerebritis and consider LP.  1. Appreciate Neurology evaluation to help further evaluate her AMS. 2. possalby related to UTI will follow with appropriate abx 3. Enterococcus and acinetobacter UTI- possible urosepsis, now on Unasyn. Await blood cultures 4. Pseudomonas catheter-related sepsis- completed fortaz 02/09/15, cath removed 5. ESRD cont with TTS. plan for HD today to get back on outpt schedule.  6. 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 Nutrition: per primary 9. Hypotension: chronic, on midodrine 10mg  tid on HD days 10. 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 Vascular access- using left thigh AVG agree with VVS not to remove AVG at this time given limited access sites available.  12. Severe protein malnutrition- per primary svc 13. SLE- on prednisone 14. Dispo- discharge to home on hold due to AMS/fever/delirium and inpatient hospitalization.  [name], MD [place] Pager [phone] 02/23/2015, 8:53 AM

## 2015-02-24 ENCOUNTER — Inpatient Hospital Stay (HOSPITAL_COMMUNITY): Payer: Medicaid Other

## 2015-02-24 DIAGNOSIS — A419 Sepsis, unspecified organism: Secondary | ICD-10-CM | POA: Insufficient documentation

## 2015-02-24 DIAGNOSIS — M25562 Pain in left knee: Secondary | ICD-10-CM

## 2015-02-24 DIAGNOSIS — N390 Urinary tract infection, site not specified: Secondary | ICD-10-CM

## 2015-02-24 DIAGNOSIS — M25569 Pain in unspecified knee: Secondary | ICD-10-CM | POA: Insufficient documentation

## 2015-02-24 LAB — RENAL FUNCTION PANEL
ANION GAP: 9 (ref 5–15)
Albumin: 1.8 g/dL — ABNORMAL LOW (ref 3.5–5.0)
BUN: 16 mg/dL (ref 6–20)
CALCIUM: 8.4 mg/dL — AB (ref 8.9–10.3)
CHLORIDE: 99 mmol/L — AB (ref 101–111)
CO2: 29 mmol/L (ref 22–32)
Creatinine, Ser: 4.92 mg/dL — ABNORMAL HIGH (ref 0.44–1.00)
GFR, EST AFRICAN AMERICAN: 12 mL/min — AB (ref >60)
GFR, EST NON AFRICAN AMERICAN: 10 mL/min — AB (ref >60)
Glucose, Bld: 72 mg/dL (ref 65–99)
Phosphorus: 4.2 mg/dL (ref 2.5–4.6)
Potassium: 3.8 mmol/L (ref 3.5–5.1)
Sodium: 137 mmol/L (ref 135–145)

## 2015-02-24 LAB — CBC
HCT: 29.4 % — ABNORMAL LOW (ref 36.0–46.0)
HEMOGLOBIN: 8.9 g/dL — AB (ref 12.0–15.0)
MCH: 28.8 pg (ref 26.0–34.0)
MCHC: 30.3 g/dL (ref 30.0–36.0)
MCV: 95.1 fL (ref 78.0–100.0)
Platelets: 122 10*3/uL — ABNORMAL LOW (ref 150–400)
RBC: 3.09 MIL/uL — AB (ref 3.87–5.11)
RDW: 17.3 % — ABNORMAL HIGH (ref 11.5–15.5)
WBC: 10.1 10*3/uL (ref 4.0–10.5)

## 2015-02-24 LAB — PROTIME-INR
INR: 1.87 — ABNORMAL HIGH (ref 0.00–1.49)
Prothrombin Time: 21.4 seconds — ABNORMAL HIGH (ref 11.6–15.2)

## 2015-02-24 LAB — CULTURE, BLOOD (ROUTINE X 2)
CULTURE: NO GROWTH
Culture: NO GROWTH

## 2015-02-24 LAB — URINE CULTURE

## 2015-02-24 LAB — HEPARIN LEVEL (UNFRACTIONATED)
HEPARIN UNFRACTIONATED: 0.34 [IU]/mL (ref 0.30–0.70)
HEPARIN UNFRACTIONATED: 0.41 [IU]/mL (ref 0.30–0.70)

## 2015-02-24 MED ORDER — INFLUENZA VAC SPLIT QUAD 0.5 ML IM SUSY: 0.5000 mL | PREFILLED_SYRINGE | INTRAMUSCULAR | Status: DC | PRN

## 2015-02-24 MED ORDER — METOPROLOL TARTRATE 25 MG PO TABS
25.0000 mg | ORAL_TABLET | Freq: Two times a day (BID) | ORAL | Status: DC
  Administered 2015-02-24 – 2015-02-25 (×3): 25 mg via ORAL
  Filled 2015-02-24 (×5): qty 1

## 2015-02-24 MED ORDER — PNEUMOCOCCAL VAC POLYVALENT 25 MCG/0.5ML IJ INJ: 0.5000 mL | INJECTION | INTRAMUSCULAR | Status: DC | PRN

## 2015-02-24 MED ORDER — HEPARIN (PORCINE) IN NACL 100-0.45 UNIT/ML-% IJ SOLN
1100.0000 [IU]/h | INTRAMUSCULAR | Status: DC
  Administered 2015-02-24: 1100 [IU]/h via INTRAVENOUS
  Filled 2015-02-24 (×3): qty 250

## 2015-02-24 NOTE — Progress Notes (Signed)
ANTICOAGULATION CONSULT NOTE - Follow Up Consult  Pharmacy Consult for Heparin  Indication: SLE/CVA/marantic endocarditis  Allergies  Allergen Reactions  . Food Swelling    Red peppers   Patient Measurements: Height: 5\' 3"  (160 cm) Weight: 183 lb 3.2 oz (83.1 kg) IBW/kg (Calculated) : 52.4  Vital Signs: Temp: 97.9 F (36.6 C) (11/27 0420) Temp Source: Oral (11/27 0420) BP: 103/64 mmHg (11/27 0420) Pulse Rate: 103 (11/27 0420)  Labs:  Recent Labs  02/22/15 2244 02/23/15 0239 02/23/15 1155 02/23/15 2030 02/24/15 0417  HGB  --  11.1*  --   --  8.9*  HCT  --  36.3  --   --  29.4*  PLT  --  163  --   --  122*  LABPROT  --  25.3*  --  23.8* 21.4*  INR  --  2.32*  --  2.14* 1.87*  HEPARINUNFRC  --  0.56  --   --   --   CREATININE  --   --  4.55*  --  4.92*  CKTOTAL 14*  --   --   --   --     Estimated Creatinine Clearance: 15.7 mL/min (by C-G formula based on Cr of 4.92).   Assessment: 39 y.o. female with h/o SLE/CVA likely due to marantic endocarditis, Coumadin on hold, for heparin  Goal of Therapy:  Heparin level 0.3-0.5 units/ml Monitor platelets by anticoagulation protocol: Yes   Plan:  Restart heparin 1100 units/hr Check heparin level in 8 hours.  Caryl Pina 02/24/2015,5:24 AM  Addendum: INR 2.14. Recheck INR in am

## 2015-02-24 NOTE — Progress Notes (Signed)
Patient ID: [name], female   DOB: 07/01/1975, 39 y.o.   MRN: [id number]  [facility] Progress Note    Subjective:   More awake and alert this morning   Objective:   BP 103/64 mmHg  Pulse 103  Temp(Src) 97.9 F (36.6 C) (Oral)  Resp 20  Ht 5\' 3"  (1.6 m)  Wt 83.1 kg (183 lb 3.2 oz)  BMI 32.46 kg/m2  SpO2 99%  LMP 01/21/2015  Intake/Output: I/O last 3 completed shifts: In: 98.6 [I.V.:98.6] Out: -371    Intake/Output this shift:    Weight change: 0.6 kg (1 lb 5.2 oz)  Physical Exam: Gen:WD [name] in NAD [id number] Resp:cta [id number] Ext: trace edema LLE,L fem AVG +T/B, +surrounding edema, no fluctuance   Labs: BMET  Recent Labs Lab 02/18/15 1615 02/19/15 1255 02/20/15 0337 02/21/15 0347 02/23/15 1155 02/24/15 0417  NA 135 139 137 137 137 137  K 3.2* 4.0 4.7 3.8 3.5 3.8  CL 100* 100* 100* 100* 102 99*  CO2 26 29 28 31 26 29   GLUCOSE 93 107* 85 61* 96 72  BUN 9 6 9 9 16 16   CREATININE 5.06* 3.97* 4.68* 4.05* 4.55* 4.92*  ALBUMIN 1.9* 2.3*  --  1.7* 1.6* 1.8*  CALCIUM 8.5* 8.6* 8.6* 8.3* 8.3* 8.4*  PHOS 2.9 2.5  --  2.7 2.8 4.2   CBC  Recent Labs Lab 02/19/15 1255  02/20/15 0337 02/21/15 0347 02/23/15 0239 02/24/15 0417  WBC 7.4  < > 8.5 5.3 18.0* 10.1  NEUTROABS 6.2  --  7.6  --   --   --   HGB 10.5*  < > 10.3* 8.8* 11.1* 8.9*  HCT 34.7*  < > 35.4* 29.6* 36.3 29.4*  MCV 96.1  < > 98.3 97.0 93.8 95.1  PLT 66*  < > 156 115* 163 122*  < > = values in this interval not displayed.  @IMGRELPRIORS @ Medications:    . ampicillin-sulbactam (UNASYN) IV  3 g Intravenous Q24H  . [START ON 03/02/2015] Darbepoetin Alfa  200 mcg Intravenous Q Sat-HD  . midodrine  10 mg Oral BID WC  . pantoprazole (PROTONIX) IV  40 mg Intravenous Q24H  . sodium chloride  3 mL Intravenous Q12H   Dialysis Orders: [facility] TTS 4 hr 400/800 EDW 87.5 2 K 2 Ca no profile right thigh TDC and left thigh graft (placed 10/7) Aranesp 200/week heparin 2600 no  labs no Fe or VDRA NO labs - hx prior steal of left arm graft s/p ligation 12/19/14  Assessment/ Plan:   1. Low grade Fever and AMS- possible delirium due to infection but also CT evidence of recurrent CVA.   1. Multi-drug resistant UTI on appropriate abx now.  Blood cultures negative to date. 2. Consider ECHO to r/o SBE 3. Stopped seroquel 02/23/15, CPK's normal so NMS less likely. Neuro following 2. Auditory hallucinations- ?psychosis from delirium or drug induced. Also need to r/o lupus cerebritis and consider LP.  1. Appreciate Neurology evaluation to help further evaluate her AMS. 2. possalby related to UTI will follow with appropriate abx 3. Enterococcus and acinetobacter UTI- possible urosepsis, now on Unasyn. Await blood cultures 4. Pseudomonas catheter-related sepsis- completed fortaz 02/09/15, cath removed 5. ESRD cont with TTS. plan for 02/26/15 to get back on outpt schedule.  She tolerated HD well yesterday  6. 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 Nutrition: per primary 9. Hypotension: chronic, on midodrine 10mg  tid on HD days 10. Hypercoagulable state due to SLE-  now on coumadin per hematology 11. Vascular access- using left thigh AVG agree with VVS not to remove AVG at this time given limited access sites available.  12. Severe protein malnutrition- per primary svc 13. SLE- on prednisone 14. Dispo- discharge to home on hold due to AMS/fever/delirium and inpatient hospitalization.  [name], MD [place] Pager [phone] 02/24/2015, 8:16 AM

## 2015-02-24 NOTE — Progress Notes (Signed)
Subjective: No acute events overnight. Patient is much more alert and talkative this morning. Still seems confused about what's going on and slower to respond, but mental status has greatly improved. She complains of left knee pain and pain in her left thigh. She notes some lower abdominal pain as well.  Objective: Vital signs in last 24 hours: Filed Vitals:   02/24/15 0003 02/24/15 0420 02/24/15 0727 02/24/15 0844  BP: 91/51 103/64  130/78  Pulse: 86 103  123  Temp: 98 F (36.7 C) 97.9 F (36.6 C) 97.9 F (36.6 C)   TempSrc: Oral Oral Oral   Resp: 16 20  22   Height:      Weight:  183 lb 3.2 oz (83.1 kg)    SpO2: 98% 99%  100%   Weight change: 1 lb 5.2 oz (0.6 kg)  Intake/Output Summary (Last 24 hours) at 02/24/15 1056 Last data filed at 02/23/15 1428  Gross per 24 hour  Intake      0 ml  Output   -371 ml  Net    371 ml   General: young obese woman sitting up in bed, more alert and talkative, NAD HEENT: [place]/AT, EOMI, mucus membranes moist CV: tachycardic in the 120s, no m/g/r Pulm: distant lung sounds, breaths non-labored  Abd: BS+, soft, obese, mild tenderness in lower abdomen particularly on left side  Ext: 3+ edema of the left thigh with mild tenderness to palpation near the graft site. 1+ distal LLE edema. There is a small effusion palpated over the left knee that is tender to palpation, no erythema.  Right leg appears normal.  Neuro: Alert and oriented x 3. Follows simple commands. Much more engaged this morning. Able to lift extremities off of bed.   Lab Results: Basic Metabolic Panel:  Recent Labs Lab 02/19/15 1255  02/23/15 1155 02/24/15 0417  NA 139  < > 137 137  K 4.0  < > 3.5 3.8  CL 100*  < > 102 99*  CO2 29  < > 26 29  GLUCOSE 107*  < > 96 72  BUN 6  < > 16 16  CREATININE 3.97*  < > 4.55* 4.92*  CALCIUM 8.6*  < > 8.3* 8.4*  MG 1.8  --   --   --   PHOS 2.5  < > 2.8 4.2  < > = values in this interval not displayed. Liver Function Tests:  Recent  Labs Lab 02/19/15 1255  02/23/15 1155 02/24/15 0417  AST 32  --   --   --   ALT 18  --   --   --   ALKPHOS 101  --   --   --   BILITOT 0.7  --   --   --   PROT 5.7*  --   --   --   ALBUMIN 2.3*  < > 1.6* 1.8*  < > = values in this interval not displayed.CBC:  Recent Labs Lab 02/19/15 1255  02/20/15 0337  02/23/15 0239 02/24/15 0417  WBC 7.4  < > 8.5  < > 18.0* 10.1  NEUTROABS 6.2  --  7.6  --   --   --   HGB 10.5*  < > 10.3*  < > 11.1* 8.9*  HCT 34.7*  < > 35.4*  < > 36.3 29.4*  MCV 96.1  < > 98.3  < > 93.8 95.1  PLT 66*  < > 156  < > 163 122*  < > = values in  this interval not displayed. CBG:  Recent Labs Lab 02/19/15 0233 02/21/15 2211  GLUCAP 72 125*   Thyroid Function Tests:  Recent Labs Lab 02/19/15 1255  TSH 4.157   Coagulation:  Recent Labs Lab 02/22/15 0950 02/23/15 0239 02/23/15 2030 02/24/15 0417  LABPROT 23.1* 25.3* 23.8* 21.4*  INR 2.06* 2.32* 2.14* 1.87*   Urine Drug Screen: Drugs of Abuse     Component Value Date/Time   LABOPIA NONE DETECTED 07/21/2012 0215   COCAINSCRNUR NONE DETECTED 07/21/2012 0215   LABBENZ NONE DETECTED 07/21/2012 0215   AMPHETMU NONE DETECTED 07/21/2012 0215   THCU NONE DETECTED 07/21/2012 0215   LABBARB NONE DETECTED 07/21/2012 0215     Micro Results: Recent Results (from the past 240 hour(s))  Culture, blood (routine x 2)     Status: None   Collection Time: 02/19/15  9:50 AM  Result Value Ref Range Status   Specimen Description BLOOD RIGHT ANTECUBITAL  Final   Special Requests BOTTLES DRAWN AEROBIC AND ANAEROBIC 5CCS  Final   Culture NO GROWTH 5 DAYS  Final   Report Status 02/24/2015 FINAL  Final  Culture, blood (routine x 2)     Status: None   Collection Time: 02/19/15 10:00 AM  Result Value Ref Range Status   Specimen Description BLOOD RIGHT HAND  Final   Special Requests BOTTLES DRAWN AEROBIC AND ANAEROBIC 5CCS  Final   Culture NO GROWTH 5 DAYS  Final   Report Status 02/24/2015 FINAL  Final    Urine culture     Status: None   Collection Time: 02/20/15 12:35 PM  Result Value Ref Range Status   Specimen Description URINE, CATHETERIZED  Final   Special Requests NONE  Final   Culture   Final    >=100,000 COLONIES/mL VANCOMYCIN RESISTANT ENTEROCOCCUS >=100,000 COLONIES/mL ACINETOBACTER CALCOACETICUS/BAUMANNII COMPLEX    Report Status 02/24/2015 FINAL  Final   Organism ID, Bacteria ACINETOBACTER CALCOACETICUS/BAUMANNII COMPLEX  Final   Organism ID, Bacteria VANCOMYCIN RESISTANT ENTEROCOCCUS  Final      Susceptibility   Acinetobacter calcoaceticus/baumannii complex - MIC*    CEFTAZIDIME >=64 RESISTANT Resistant     CEFTRIAXONE >=64 RESISTANT Resistant     CIPROFLOXACIN >=4 RESISTANT Resistant     GENTAMICIN 2 SENSITIVE Sensitive     IMIPENEM 2 SENSITIVE Sensitive     PIP/TAZO 32 INTERMEDIATE Intermediate     TRIMETH/SULFA >=320 RESISTANT Resistant     AMPICILLIN/SULBACTAM 4 SENSITIVE Sensitive     * >=100,000 COLONIES/mL ACINETOBACTER CALCOACETICUS/BAUMANNII COMPLEX   Vancomycin resistant enterococcus - MIC*    AMPICILLIN <=2 SENSITIVE Sensitive     LEVOFLOXACIN >=8 RESISTANT Resistant     NITROFURANTOIN <=16 SENSITIVE Sensitive     VANCOMYCIN >=32 RESISTANT Resistant     LINEZOLID 2 SENSITIVE Sensitive     * >=100,000 COLONIES/mL VANCOMYCIN RESISTANT ENTEROCOCCUS  Culture, blood (routine x 2)     Status: None (Preliminary result)   Collection Time: 02/20/15  3:30 PM  Result Value Ref Range Status   Specimen Description BLOOD VENOUS  Final   Special Requests BOTTLES DRAWN AEROBIC AND ANAEROBIC 10ML  Final   Culture NO GROWTH 4 DAYS  Final   Report Status PENDING  Incomplete  Culture, blood (routine x 2)     Status: None (Preliminary result)   Collection Time: 02/20/15  4:16 PM  Result Value Ref Range Status   Specimen Description BLOOD ARTERIAL LINE  Final   Special Requests BOTTLES DRAWN AEROBIC AND ANAEROBIC 10ML  Final   Culture  NO GROWTH 4 DAYS  Final   Report  Status PENDING  Incomplete  Culture, blood (routine x 2)     Status: None (Preliminary result)   Collection Time: 02/21/15  8:36 PM  Result Value Ref Range Status   Specimen Description BLOOD RIGHT HAND  Final   Special Requests IN PEDIATRIC BOTTLE 3CC  Final   Culture NO GROWTH 3 DAYS  Final   Report Status PENDING  Incomplete  Culture, blood (routine x 2)     Status: None (Preliminary result)   Collection Time: 02/21/15  9:21 PM  Result Value Ref Range Status   Specimen Description BLOOD RIGHT THUMB  Final   Special Requests IN PEDIATRIC BOTTLE 1.5CC  Final   Culture NO GROWTH 3 DAYS  Final   Report Status PENDING  Incomplete   Studies/Results: Ct Head Wo Contrast  02/22/2015  CLINICAL DATA:  Diffuse body pain, acute onset.  Initial encounter. EXAM: CT HEAD WITHOUT CONTRAST TECHNIQUE: Contiguous axial images were obtained from the base of the skull through the vertex without intravenous contrast. COMPARISON:  CT of the head performed 02/19/2015, and MRI of the brain performed 02/20/2015 FINDINGS: There is an evolving subacute to chronic right MCA territory infarct, as previously noted. A tiny focus of adjacent calcification is unchanged in appearance. The posterior fossa, including the cerebellum, brainstem and fourth ventricle, is within normal limits. The third and lateral ventricles, and basal ganglia are unremarkable in appearance. No midline shift is seen. There is no evidence of fracture; visualized osseous structures are unremarkable in appearance. The visualized portions of the orbits are within normal limits. The paranasal sinuses and mastoid air cells are well-aerated. No significant soft tissue abnormalities are seen. IMPRESSION: 1. No acute intracranial abnormality seen on CT. 2. Evolving subacute to chronic right MCA territory infarct again noted, relatively unchanged in appearance. Electronically Signed   By: Garald Balding M.D.   On: 02/22/2015 22:13   Medications: I have  reviewed the patient's current medications. Scheduled Meds: . ampicillin-sulbactam (UNASYN) IV  3 g Intravenous Q24H  . [START ON 03/02/2015] Darbepoetin Alfa  200 mcg Intravenous Q Sat-HD  . midodrine  10 mg Oral BID WC  . pantoprazole (PROTONIX) IV  40 mg Intravenous Q24H  . sodium chloride  3 mL Intravenous Q12H   Continuous Infusions: . heparin 1,100 Units/hr (02/24/15 1000)   PRN Meds:.acetaminophen, acetaminophen, haloperidol lactate, heparin, HYDROmorphone (DILAUDID) injection, Influenza vac split quadrivalent PF, ondansetron **OR** ondansetron (ZOFRAN) IV, pneumococcal 23 valent vaccine, polyethylene glycol Assessment/Plan:  Acute delirium 2/2 sepsis: Her AMS has improved significantly. Suspect this is all related to UTI. Her WBC count has trended down to 10.2 from 18 (although may in part be dilutional as all cell counts down), she has been afebrile over last 24 hours, and tachycardia improving. Urine cx w/ acinetobacter and now VRE, sensitive to ampicillin. All blood cx with no growth. Will continue on Unasyn. Expect her to continue to improve clinically. No need for LP as mental status improving.  - Continue Unasyn (11/25>>>) - f/u blood cultures>> NGTD - Dilaudid 1 mg IV Q4H PRN pain -Tylenol prn for fever - Neuro following, appreciate recommendations  Left thigh edema/L knee effusion: Ultrasound demonstrated a large surrounding complex fluid collection. She continues to have tenderness and warmth over this area. No current plan to aspirate, however. Vascular surgery evaluated her on 11/25 and do not believe the graft is infected. This morning she is complaining of L knee pain and there is  a small effusion present on exam. Will get x-ray of knee to see if there is enough fluid to tap.  - X-ray of L knee - F/U HD graft Cx sample>> NGTD - Continuing Unasyn as above  SLE: C3 low at 47, total complement and C4 WNL. Neuro consulted. Difficult to rule lupus cerebritis in or out during  active infection. - Plaquenil on hold since not swallowing/eating well  ESRD on HD:  MWF schedule currently. Her BP remains a limitation for dialysis but tolerated most of last session.  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 We are now holding her coumadin in case a procedure needs to be done. Will cover with heparin as needed when INR is subtherapeutic. Expect that we can restart soon as she is clinically improving.  - Holding Coumadin - Heparin per pharmacy  H/o SVT/Tachycardia:  - Restart Lopressor 25 mg BID  Diet: Dysphagia 1 DVT ppx: Coumadin on hold, Heparin when needed FULL CODE Dispo: Disposition is deferred at this time, awaiting improvement of current medical problems. Anticipated discharge will depend on treatment plan after further evaluation.  The patient does have a current PCP Lin Landsman, MD) and does need an [facility] hospital follow-up appointment after discharge.  The patient does not have transportation limitations that hinder transportation to clinic appointments.   LOS: 5 days   [name], MD, MPH Internal Medicine Resident, PGY-II Pager: [phone] 02/24/2015, 10:56 AM

## 2015-02-24 NOTE — Progress Notes (Signed)
ANTICOAGULATION CONSULT NOTE - Follow Up Consult  Pharmacy Consult for Heparin  Indication: SLE/CVA/endocarditis  Allergies  Allergen Reactions  . Food Swelling    Red peppers    Patient Measurements: Height: 5\' 3"  (160 cm) Weight: 183 lb 3.2 oz (83.1 kg) IBW/kg (Calculated) : 52.4 Vital Signs: Temp: 97.4 F (36.3 C) (11/27 2340) Temp Source: Oral (11/27 2340) BP: 105/48 mmHg (11/27 2340) Pulse Rate: 83 (11/27 2340)  Labs:  Recent Labs  02/22/15 2244 02/23/15 0239 02/23/15 1155 02/23/15 2030 02/24/15 0417 02/24/15 1410 02/24/15 2247  HGB  --  11.1*  --   --  8.9*  --   --   HCT  --  36.3  --   --  29.4*  --   --   PLT  --  163  --   --  122*  --   --   LABPROT  --  25.3*  --  23.8* 21.4*  --   --   INR  --  2.32*  --  2.14* 1.87*  --   --   HEPARINUNFRC  --  0.56  --   --   --  0.34 0.41  CREATININE  --   --  4.55*  --  4.92*  --   --   CKTOTAL 14*  --   --   --   --   --   --     Estimated Creatinine Clearance: 15.7 mL/min (by C-G formula based on Cr of 4.92).   Assessment: Heparin level therapeutic x 2  Goal of Therapy:  Heparin level 0.3-0.5 units/ml Monitor platelets by anticoagulation protocol: Yes   Plan: -Continue heparin at 1100 units/hr -Daily CBC/HL  Narda Bonds 02/24/2015,11:45 PM

## 2015-02-24 NOTE — Progress Notes (Signed)
ANTICOAGULATION CONSULT NOTE - Follow Up Consult  Pharmacy Consult for Heparin  Indication: SLE/CVA/marantic endocarditis  Allergies  Allergen Reactions  . Food Swelling    Red peppers   Patient Measurements: Height: 5\' 3"  (160 cm) Weight: 183 lb 3.2 oz (83.1 kg) IBW/kg (Calculated) : 52.4  Vital Signs: Temp: 98.4 F (36.9 C) (11/27 1155) Temp Source: Oral (11/27 1155) BP: 130/74 mmHg (11/27 1155) Pulse Rate: 108 (11/27 1155)  Labs:  Recent Labs  02/22/15 2244 02/23/15 0239 02/23/15 1155 02/23/15 2030 02/24/15 0417 02/24/15 1410  HGB  --  11.1*  --   --  8.9*  --   HCT  --  36.3  --   --  29.4*  --   PLT  --  163  --   --  122*  --   LABPROT  --  25.3*  --  23.8* 21.4*  --   INR  --  2.32*  --  2.14* 1.87*  --   HEPARINUNFRC  --  0.56  --   --   --  0.34  CREATININE  --   --  4.55*  --  4.92*  --   CKTOTAL 14*  --   --   --   --   --     Estimated Creatinine Clearance: 15.7 mL/min (by C-G formula based on Cr of 4.92).   Assessment: 39 y.o. female with h/o SLE/CVA likely due to marantic endocarditis. Coumadin on hold.  INR 1.87 this AM and heparin restarted. HL therapeutic at 0.34. Will obtain confirmatory 8hr HL. Dialysis yesterday. Hgb 11.1>8.9, Plt 122. No reported bleeding.   Goal of Therapy:  Heparin level 0.3-0.5 units/ml Monitor platelets by anticoagulation protocol: Yes   Plan:  Continue heparin 1100 units/hr  8hr HL Daily HL/CBC F/U warfarin plan Monitor CBC, s/sx bleeding  Judieth Keens 02/24/2015,3:24 PM

## 2015-02-24 NOTE — Progress Notes (Signed)
Subjective: Patient complained of headache but had no other complaints. Mental status has continued to improve over the last 24 hours.  Objective: Current vital signs: BP 103/64 mmHg  Pulse 103  Temp(Src) 97.9 F (36.6 C) (Oral)  Resp 20  Ht 5\' 3"  (1.6 m)  Wt 83.1 kg (183 lb 3.2 oz)  BMI 32.46 kg/m2  SpO2 99%  LMP 01/21/2015  Neurologic Exam: Patient was alert and in no acute distress. She was well-oriented to time as well as place. However she was still not confused with frequent confabulations. Extraocular movements were full and conjugate. Visual fields were intact and normal. Minimal right lower facial weakness noted. Muscle tone in extremities was only slightly increased. She had moderate diffuse weakness, but was able to move extremities well, including lower extremities.  Medications: I have reviewed the patient's current medications.  Assessment/Plan: 39 year old lady with hypertension, end-stage renal disease on dialysis, systemic lupus and history of left cerebral infarctions, with encephalopathic state associated with acute urinary tract infection and possible sepsis. Mental status has noticeably improved. She still has confusion with confabulations, however.  Recommend no changes in current management. With the noticeable improvement in mental status, lumbar puncture is not indicated.  We will continue to follow this patient with you.  C.R. Nicole Kindred, MD [facility] [phone]  02/24/2015  9:14 AM

## 2015-02-25 DIAGNOSIS — M7989 Other specified soft tissue disorders: Secondary | ICD-10-CM

## 2015-02-25 DIAGNOSIS — M79662 Pain in left lower leg: Secondary | ICD-10-CM | POA: Insufficient documentation

## 2015-02-25 DIAGNOSIS — M79605 Pain in left leg: Secondary | ICD-10-CM

## 2015-02-25 LAB — CULTURE, BLOOD (ROUTINE X 2)
CULTURE: NO GROWTH
CULTURE: NO GROWTH

## 2015-02-25 LAB — HEPARIN LEVEL (UNFRACTIONATED): HEPARIN UNFRACTIONATED: 0.43 [IU]/mL (ref 0.30–0.70)

## 2015-02-25 LAB — CBC
HEMATOCRIT: 27.5 % — AB (ref 36.0–46.0)
HEMOGLOBIN: 8.4 g/dL — AB (ref 12.0–15.0)
MCH: 29.1 pg (ref 26.0–34.0)
MCHC: 30.5 g/dL (ref 30.0–36.0)
MCV: 95.2 fL (ref 78.0–100.0)
Platelets: 131 10*3/uL — ABNORMAL LOW (ref 150–400)
RBC: 2.89 MIL/uL — ABNORMAL LOW (ref 3.87–5.11)
RDW: 17.1 % — ABNORMAL HIGH (ref 11.5–15.5)
WBC: 9.1 10*3/uL (ref 4.0–10.5)

## 2015-02-25 LAB — PROTIME-INR
INR: 2.31 — ABNORMAL HIGH (ref 0.00–1.49)
Prothrombin Time: 25.2 seconds — ABNORMAL HIGH (ref 11.6–15.2)

## 2015-02-25 MED ORDER — HEPARIN SODIUM (PORCINE) 1000 UNIT/ML DIALYSIS: 1000.0000 [IU] | INTRAMUSCULAR | Status: DC | PRN

## 2015-02-25 MED ORDER — PENTAFLUOROPROP-TETRAFLUOROETH EX AERO: 1.0000 "application " | INHALATION_SPRAY | CUTANEOUS | Status: DC | PRN

## 2015-02-25 MED ORDER — HEPARIN SODIUM (PORCINE) 1000 UNIT/ML DIALYSIS
2600.0000 [IU] | Freq: Once | INTRAMUSCULAR | Status: AC
  Administered 2015-02-26: 2600 [IU] via INTRAVENOUS_CENTRAL

## 2015-02-25 MED ORDER — LIDOCAINE-PRILOCAINE 2.5-2.5 % EX CREA: 1.0000 "application " | TOPICAL_CREAM | CUTANEOUS | Status: DC | PRN

## 2015-02-25 MED ORDER — HYDROXYCHLOROQUINE SULFATE 200 MG PO TABS
400.0000 mg | ORAL_TABLET | Freq: Every day | ORAL | Status: DC
  Administered 2015-02-25 – 2015-03-22 (×21): 400 mg via ORAL
  Filled 2015-02-25 (×28): qty 2

## 2015-02-25 MED ORDER — LIDOCAINE HCL (PF) 1 % IJ SOLN: 5.0000 mL | INTRAMUSCULAR | Status: DC | PRN

## 2015-02-25 MED ORDER — PANTOPRAZOLE SODIUM 40 MG PO TBEC
40.0000 mg | DELAYED_RELEASE_TABLET | Freq: Every day | ORAL | Status: DC
  Administered 2015-02-25 – 2015-03-22 (×22): 40 mg via ORAL
  Filled 2015-02-25 (×23): qty 1

## 2015-02-25 MED ORDER — METOPROLOL SUCCINATE ER 25 MG PO TB24
25.0000 mg | ORAL_TABLET | Freq: Every day | ORAL | Status: DC
  Administered 2015-02-26 – 2015-03-08 (×10): 25 mg via ORAL
  Filled 2015-02-25 (×10): qty 1

## 2015-02-25 MED ORDER — WARFARIN SODIUM 4 MG PO TABS
4.0000 mg | ORAL_TABLET | Freq: Once | ORAL | Status: AC
  Administered 2015-02-25: 4 mg via ORAL
  Filled 2015-02-25: qty 1

## 2015-02-25 MED ORDER — ALTEPLASE 2 MG IJ SOLR: 2.0000 mg | Freq: Once | INTRAMUSCULAR | Status: DC | PRN

## 2015-02-25 MED ORDER — SODIUM CHLORIDE 0.9 % IV SOLN: 100.0000 mL | INTRAVENOUS | Status: DC | PRN

## 2015-02-25 MED ORDER — WARFARIN - PHARMACIST DOSING INPATIENT
Freq: Every day | Status: DC
  Administered 2015-03-10 – 2015-03-11 (×2)

## 2015-02-25 NOTE — Progress Notes (Signed)
Subjective: No acute events overnight. She was alert and and talkative this morning. She reports having some confusion recalling the events of past few days clearly. She does have pain in her left knee and calf that is her biggest complaint today. Says her abdominal pain is resolved.  Objective: Vital signs in last 24 hours: Filed Vitals:   02/24/15 2340 02/25/15 0500 02/25/15 0515 02/25/15 0753  BP: 105/48  98/51 113/66  Pulse: 83  75 81  Temp: 97.4 F (36.3 C)  97.6 F (36.4 C) 98.1 F (36.7 C)  TempSrc: Oral  Oral Oral  Resp: 18  27 20   Height:      Weight:  84.5 kg (186 lb 4.6 oz)    SpO2: 100%  100% 100%   Weight change: 0.9 kg (1 lb 15.8 oz)  Intake/Output Summary (Last 24 hours) at 02/25/15 1100 Last data filed at 02/25/15 0500  Gross per 24 hour  Intake 567.32 ml  Output    126 ml  Net 441.32 ml   GENERAL- obese, ill appearing, alert, NAD, co-operative HEENT- Atraumatic, PERRL, oral mucosa appears moist CARDIAC- RRR, no murmurs, rubs or gallops. RESP- Distant lung sounds, no crackles or wheezes ABDOMEN- Soft, nontender, normoactive bowel sounds present NEURO- Not moving her left leg on command, LUE movement after long delay to commands, no facial assymetry EXTREMITIES- 3+ edema of the left thigh around graft site, 1+ distal LLE edema, small left knee effusion that is tender but not erythematous PSYCH- Flat affect, some speech slightly delayed, appropriate thought and speech content  Lab Results: Basic Metabolic Panel:  Recent Labs Lab 02/19/15 1255  02/23/15 1155 02/24/15 0417  NA 139  < > 137 137  K 4.0  < > 3.5 3.8  CL 100*  < > 102 99*  CO2 29  < > 26 29  GLUCOSE 107*  < > 96 72  BUN 6  < > 16 16  CREATININE 3.97*  < > 4.55* 4.92*  CALCIUM 8.6*  < > 8.3* 8.4*  MG 1.8  --   --   --   PHOS 2.5  < > 2.8 4.2  < > = values in this interval not displayed. Liver Function Tests:  Recent Labs Lab 02/19/15 1255  02/23/15 1155 02/24/15 0417  AST 32   --   --   --   ALT 18  --   --   --   ALKPHOS 101  --   --   --   BILITOT 0.7  --   --   --   PROT 5.7*  --   --   --   ALBUMIN 2.3*  < > 1.6* 1.8*  < > = values in this interval not displayed.CBC:  Recent Labs Lab 02/19/15 1255  02/20/15 0337  02/24/15 0417 02/25/15 0435  WBC 7.4  < > 8.5  < > 10.1 9.1  NEUTROABS 6.2  --  7.6  --   --   --   HGB 10.5*  < > 10.3*  < > 8.9* 8.4*  HCT 34.7*  < > 35.4*  < > 29.4* 27.5*  MCV 96.1  < > 98.3  < > 95.1 95.2  PLT 66*  < > 156  < > 122* 131*  < > = values in this interval not displayed. CBG:  Recent Labs Lab 02/19/15 0233 02/21/15 2211  GLUCAP 72 125*   Thyroid Function Tests:  Recent Labs Lab 02/19/15 1255  TSH 4.157  Coagulation:  Recent Labs Lab 02/23/15 0239 02/23/15 2030 02/24/15 0417 02/25/15 0435  LABPROT 25.3* 23.8* 21.4* 25.2*  INR 2.32* 2.14* 1.87* 2.31*    Micro Results: Recent Results (from the past 240 hour(s))  Culture, blood (routine x 2)     Status: None   Collection Time: 02/19/15  9:50 AM  Result Value Ref Range Status   Specimen Description BLOOD RIGHT ANTECUBITAL  Final   Special Requests BOTTLES DRAWN AEROBIC AND ANAEROBIC 5CCS  Final   Culture NO GROWTH 5 DAYS  Final   Report Status 02/24/2015 FINAL  Final  Culture, blood (routine x 2)     Status: None   Collection Time: 02/19/15 10:00 AM  Result Value Ref Range Status   Specimen Description BLOOD RIGHT HAND  Final   Special Requests BOTTLES DRAWN AEROBIC AND ANAEROBIC 5CCS  Final   Culture NO GROWTH 5 DAYS  Final   Report Status 02/24/2015 FINAL  Final  Urine culture     Status: None   Collection Time: 02/20/15 12:35 PM  Result Value Ref Range Status   Specimen Description URINE, CATHETERIZED  Final   Special Requests NONE  Final   Culture   Final    >=100,000 COLONIES/mL VANCOMYCIN RESISTANT ENTEROCOCCUS >=100,000 COLONIES/mL ACINETOBACTER CALCOACETICUS/BAUMANNII COMPLEX    Report Status 02/24/2015 FINAL  Final   Organism ID,  Bacteria ACINETOBACTER CALCOACETICUS/BAUMANNII COMPLEX  Final   Organism ID, Bacteria VANCOMYCIN RESISTANT ENTEROCOCCUS  Final      Susceptibility   Acinetobacter calcoaceticus/baumannii complex - MIC*    CEFTAZIDIME >=64 RESISTANT Resistant     CEFTRIAXONE >=64 RESISTANT Resistant     CIPROFLOXACIN >=4 RESISTANT Resistant     GENTAMICIN 2 SENSITIVE Sensitive     IMIPENEM 2 SENSITIVE Sensitive     PIP/TAZO 32 INTERMEDIATE Intermediate     TRIMETH/SULFA >=320 RESISTANT Resistant     AMPICILLIN/SULBACTAM 4 SENSITIVE Sensitive     * >=100,000 COLONIES/mL ACINETOBACTER CALCOACETICUS/BAUMANNII COMPLEX   Vancomycin resistant enterococcus - MIC*    AMPICILLIN <=2 SENSITIVE Sensitive     LEVOFLOXACIN >=8 RESISTANT Resistant     NITROFURANTOIN <=16 SENSITIVE Sensitive     VANCOMYCIN >=32 RESISTANT Resistant     LINEZOLID 2 SENSITIVE Sensitive     * >=100,000 COLONIES/mL VANCOMYCIN RESISTANT ENTEROCOCCUS  Culture, blood (routine x 2)     Status: None (Preliminary result)   Collection Time: 02/20/15  3:30 PM  Result Value Ref Range Status   Specimen Description BLOOD VENOUS  Final   Special Requests BOTTLES DRAWN AEROBIC AND ANAEROBIC 10ML  Final   Culture NO GROWTH 4 DAYS  Final   Report Status PENDING  Incomplete  Culture, blood (routine x 2)     Status: None (Preliminary result)   Collection Time: 02/20/15  4:16 PM  Result Value Ref Range Status   Specimen Description BLOOD ARTERIAL LINE  Final   Special Requests BOTTLES DRAWN AEROBIC AND ANAEROBIC 10ML  Final   Culture NO GROWTH 4 DAYS  Final   Report Status PENDING  Incomplete  Culture, blood (routine x 2)     Status: None (Preliminary result)   Collection Time: 02/21/15  8:36 PM  Result Value Ref Range Status   Specimen Description BLOOD RIGHT HAND  Final   Special Requests IN PEDIATRIC BOTTLE 3CC  Final   Culture NO GROWTH 3 DAYS  Final   Report Status PENDING  Incomplete  Culture, blood (routine x 2)     Status: None  (Preliminary result)  Collection Time: 02/21/15  9:21 PM  Result Value Ref Range Status   Specimen Description BLOOD RIGHT THUMB  Final   Special Requests IN PEDIATRIC BOTTLE 1.5CC  Final   Culture NO GROWTH 3 DAYS  Final   Report Status PENDING  Incomplete   Studies/Results: Dg Knee Left Port  02/24/2015  CLINICAL DATA:  LEFT knee pain and thigh pain EXAM: PORTABLE LEFT KNEE - 1-2 VIEW COMPARISON:  None. FINDINGS: No fracture of the proximal tibia or distal femur. Patella is normal. No joint effusion. IMPRESSION: No fracture or dislocation.  No joint effusion. Electronically Signed   By: Suzy Bouchard M.D.   On: 02/24/2015 11:35   Medications: I have reviewed the patient's current medications. Scheduled Meds: . ampicillin-sulbactam (UNASYN) IV  3 g Intravenous Q24H  . [START ON 03/02/2015] Darbepoetin Alfa  200 mcg Intravenous Q Sat-HD  . metoprolol tartrate  25 mg Oral BID  . midodrine  10 mg Oral BID WC  . pantoprazole  40 mg Oral Daily  . sodium chloride  3 mL Intravenous Q12H   Continuous Infusions: . heparin 1,100 Units/hr (02/24/15 1729)   PRN Meds:.acetaminophen, acetaminophen, heparin, HYDROmorphone (DILAUDID) injection, Influenza vac split quadrivalent PF, ondansetron **OR** ondansetron (ZOFRAN) IV, pneumococcal 23 valent vaccine, polyethylene glycol Assessment/Plan: Acute delirium 2/2 UTI: Improving on Unasyn, initial choice of empiric abtx was not covering for VRE or MDR acinetobacter. Afebrile, heart rate down, no longer delirious, WBC trending down. Blood cultures are negative x4 days now but may be negative for bacteremia. -F/U blood cultures -qAM CBC, renal labs -Continue Unasyn 11/25>> -Tylenol prn for fever -Dilaudid 1 mg IV Q4H PRN pain  Left leg edema: Left calf increasingly tender to palpation, may be related to her edema and being stationary but she is also at highly increased thrombosis risk. Left knee effusion may be related to her SLE, or from the  extensive surrounding edema from her thigh graft site. -Obtain US for LEDVT -Continuing abtx as above  SLE: C3 low at 47, total complement and C4 WNL. -Continue Plaquenil  ESRD on HD:  MWF schedule currently this admission. She was scheduled for [facility] outpatient routine. -Midodrine 10mg  BIDWM -Nephrology following, appreciate recommendations  -HD per nephrology  H/o recent R. MCA Embolic CVA: Can resume coumadin therapy as she will most likely not require any procedural intervention due to improvement with treating her UTI. INR was still 2.31 today. -d/c heparin -Restart Coumadin -Lipitor  H/o SVT/Tachycardia: Toprol-XL 25mg   Diet: Renal DVT ppx: Coumadin FULL CODE  Dispo: Disposition is deferred at this time, awaiting improvement of current medical problems. Anticipated discharge will depend on treatment plan after further evaluation, approximately 2-4 days.  The patient does have a current PCP Lin Landsman, MD) and does need an [facility] hospital follow-up appointment after discharge.  The patient does not have transportation limitations that hinder transportation to clinic appointments.   LOS: 6 days   Collier Salina, MD 02/25/2015, 11:00 AM

## 2015-02-25 NOTE — Progress Notes (Addendum)
MEDICATION RELATED CONSULT NOTE - FOLLOW UP   Pharmacy Consult for Unasyn and Heparin Indication: Acinetobacter + VRE UTI and SLE/CVA/endocarditis  Allergies  Allergen Reactions  . Food Swelling    Red peppers    Patient Measurements: Height: 5\' 3"  (160 cm) Weight: 186 lb 4.6 oz (84.5 kg) IBW/kg (Calculated) : 52.4 Heparin dosing weight: 71 kg  Vital Signs: Temp: 98.1 F (36.7 C) (11/28 0753) Temp Source: Oral (11/28 0753) BP: 98/51 mmHg (11/28 0515) Pulse Rate: 75 (11/28 0515) Intake/Output from previous day: 11/27 0701 - 11/28 0700 In: 567.3 [P.O.:120; I.V.:247.3; IV Piggyback:200] Out: 126 [Urine:125; Stool:1] Intake/Output from this shift:    Labs:  Recent Labs  02/23/15 0239 02/23/15 1155 02/24/15 0417 02/25/15 0435  WBC 18.0*  --  10.1 9.1  HGB 11.1*  --  8.9* 8.4*  HCT 36.3  --  29.4* 27.5*  PLT 163  --  122* 131*  CREATININE  --  4.55* 4.92*  --   PHOS  --  2.8 4.2  --   ALBUMIN  --  1.6* 1.8*  --    Estimated Creatinine Clearance: 15.8 mL/min (by C-G formula based on Cr of 4.92).   Microbiology: Recent Results (from the past 720 hour(s))  Cath Tip Culture     Status: None   Collection Time: 01/26/15  5:23 PM  Result Value Ref Range Status   Specimen Description CATH TIP RIGHT THIGH HEMODIALYSIS CATHETER  Final   Special Requests NONE  Final   Culture LT15 Performed at [facility]   Final   Report Status 01/30/2015 FINAL  Final  C difficile quick scan w PCR reflex     Status: None   Collection Time: 01/27/15  9:46 AM  Result Value Ref Range Status   C Diff antigen NEGATIVE NEGATIVE Final   C Diff toxin NEGATIVE NEGATIVE Final   C Diff interpretation Negative for toxigenic C. difficile  Final  Culture, blood (routine x 2)     Status: None   Collection Time: 01/28/15  1:42 PM  Result Value Ref Range Status   Specimen Description BLOOD LEFT HAND  Final   Special Requests IN PEDIATRIC BOTTLE  3CC  Final   Culture NO GROWTH 5 DAYS   Final   Report Status 02/02/2015 FINAL  Final  C difficile quick scan w PCR reflex     Status: None   Collection Time: 02/12/15  4:35 PM  Result Value Ref Range Status   C Diff antigen NEGATIVE NEGATIVE Final   C Diff toxin NEGATIVE NEGATIVE Final   C Diff interpretation Negative for toxigenic C. difficile  Final  Culture, blood (routine x 2)     Status: None   Collection Time: 02/19/15  9:50 AM  Result Value Ref Range Status   Specimen Description BLOOD RIGHT ANTECUBITAL  Final   Special Requests BOTTLES DRAWN AEROBIC AND ANAEROBIC 5CCS  Final   Culture NO GROWTH 5 DAYS  Final   Report Status 02/24/2015 FINAL  Final  Culture, blood (routine x 2)     Status: None   Collection Time: 02/19/15 10:00 AM  Result Value Ref Range Status   Specimen Description BLOOD RIGHT HAND  Final   Special Requests BOTTLES DRAWN AEROBIC AND ANAEROBIC 5CCS  Final   Culture NO GROWTH 5 DAYS  Final   Report Status 02/24/2015 FINAL  Final  Urine culture     Status: None   Collection Time: 02/20/15 12:35 PM  Result Value Ref Range  Status   Specimen Description URINE, CATHETERIZED  Final   Special Requests NONE  Final   Culture   Final    >=100,000 COLONIES/mL VANCOMYCIN RESISTANT ENTEROCOCCUS >=100,000 COLONIES/mL ACINETOBACTER CALCOACETICUS/BAUMANNII COMPLEX    Report Status 02/24/2015 FINAL  Final   Organism ID, Bacteria ACINETOBACTER CALCOACETICUS/BAUMANNII COMPLEX  Final   Organism ID, Bacteria VANCOMYCIN RESISTANT ENTEROCOCCUS  Final      Susceptibility   Acinetobacter calcoaceticus/baumannii complex - MIC*    CEFTAZIDIME >=64 RESISTANT Resistant     CEFTRIAXONE >=64 RESISTANT Resistant     CIPROFLOXACIN >=4 RESISTANT Resistant     GENTAMICIN 2 SENSITIVE Sensitive     IMIPENEM 2 SENSITIVE Sensitive     PIP/TAZO 32 INTERMEDIATE Intermediate     TRIMETH/SULFA >=320 RESISTANT Resistant     AMPICILLIN/SULBACTAM 4 SENSITIVE Sensitive     * >=100,000 COLONIES/mL ACINETOBACTER CALCOACETICUS/BAUMANNII  COMPLEX   Vancomycin resistant enterococcus - MIC*    AMPICILLIN <=2 SENSITIVE Sensitive     LEVOFLOXACIN >=8 RESISTANT Resistant     NITROFURANTOIN <=16 SENSITIVE Sensitive     VANCOMYCIN >=32 RESISTANT Resistant     LINEZOLID 2 SENSITIVE Sensitive     * >=100,000 COLONIES/mL VANCOMYCIN RESISTANT ENTEROCOCCUS  Culture, blood (routine x 2)     Status: None (Preliminary result)   Collection Time: 02/20/15  3:30 PM  Result Value Ref Range Status   Specimen Description BLOOD VENOUS  Final   Special Requests BOTTLES DRAWN AEROBIC AND ANAEROBIC 10ML  Final   Culture NO GROWTH 4 DAYS  Final   Report Status PENDING  Incomplete  Culture, blood (routine x 2)     Status: None (Preliminary result)   Collection Time: 02/20/15  4:16 PM  Result Value Ref Range Status   Specimen Description BLOOD ARTERIAL LINE  Final   Special Requests BOTTLES DRAWN AEROBIC AND ANAEROBIC 10ML  Final   Culture NO GROWTH 4 DAYS  Final   Report Status PENDING  Incomplete  Culture, blood (routine x 2)     Status: None (Preliminary result)   Collection Time: 02/21/15  8:36 PM  Result Value Ref Range Status   Specimen Description BLOOD RIGHT HAND  Final   Special Requests IN PEDIATRIC BOTTLE 3CC  Final   Culture NO GROWTH 3 DAYS  Final   Report Status PENDING  Incomplete  Culture, blood (routine x 2)     Status: None (Preliminary result)   Collection Time: 02/21/15  9:21 PM  Result Value Ref Range Status   Specimen Description BLOOD RIGHT THUMB  Final   Special Requests IN PEDIATRIC BOTTLE 1.5CC  Final   Culture NO GROWTH 3 DAYS  Final   Report Status PENDING  Incomplete    Assessment: 39 y/o female with ESRD on warfarin for history of SLE, stroke, and hypercoagulable state. She has been on warfarin 5 mg daily since 02/13/15. Warfarin is on hold for possible lumbar puncture or other procedure. Pharmacy consulted to begin heparin when INR <2. Warfarin now on hold initially d/t supratherapeutic INR but now d/t  possible AV graft removal +/- LP.  Started heparin once INR < 2. Now HL is therapeutic at 0.43. INR jumped back up to 2.31 with no coumadin dose. Hgb trending down to 8.4, plts 131, no s/s of bleed.  Also following for Unasyn.Now day #3 of abx for sepsis secondary to UTI. Concerned L thigh AVG infected but hesitant to remove as this is last access site. Recently completed treatment for Pseudomonas bacteremia in early November.  Initial ceftaz and vanc x 3 days did not cover Acinetobacter and VRE UTI. Afebrile, WBC wnl.   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   Plan: Stop heparin gtt Give coumadin 4mg  PO x 1 tonight Monitor daily INR, CBC, s/s of bleed

## 2015-02-25 NOTE — Progress Notes (Signed)
Pt transferred to room 6e20 from [place].  Tele 19 SR 80.  Alert with some confusion.  Pt thinks father of child is coming to hurt her.  Pts friend says she has been this way since AMS.  Pt in bed call bell in reach, door closed per request, bed alarm on.

## 2015-02-25 NOTE — Progress Notes (Addendum)
Occupational Therapy Treatment Patient Details Name: [name] MRN: [id number] DOB: 07-May-1975 Today's Date: 02/25/2015    History of present illness Pt is a 39 y.o. F from CIR who has had several recent hospitalizations.  She had a Rt MCA infarction and developed pseudomonal bacteremia from Rt femoral HD line which was treated.  Was doing well in CIR until she acutely developed severe confusion and hallucinations following HD sessions, likely a result of sepsis from Lt ant thigh abscess. Pt's additional PMH includes lupus, fibromyalgia, anemia.   OT comments  Pt progressing. Continue to recommend CIR for rehab.  Follow Up Recommendations  CIR    Equipment Recommendations   (defer to next venue)    Recommendations for Other Services Rehab consult    Precautions / Restrictions Precautions Precautions: Fall Precaution Comments: pt with high anxiety Restrictions Weight Bearing Restrictions: No       Mobility Bed Mobility Overal bed mobility: Needs Assistance Bed Mobility: Rolling;Sidelying to Sit Rolling: Mod assist Sidelying to sit: Mod assist (+2 assist given but may could have handled with +1)       General bed mobility comments: pt desired to roll to R despite not having any room and required modA to preven fall off bed and to get into sidelying, max directional cues to push up from elbow and hand to push into sitting, modA for trunk elevation  Transfers Overall transfer level: Needs assistance Equipment used: Rolling walker (2 wheeled) Transfers: Sit to/from [facility] Sit to Stand: +2 physical assistance;Min assist Stand pivot transfers: +2 physical assistance;Min assist       General transfer comment: max directional v/c's for walker management, pt desired not to use however noted from previous PT notes pt required RW for safe ambulation    Balance Overall balance assessment: Needs assistance Sitting-balance support: Feet supported;No  upper extremity supported Sitting balance-Leahy Scale: Fair                       ADL Overall ADL's : Needs assistance/impaired Eating/Feeding: Independent;Sitting Eating/Feeding Details (indicate cue type and reason): drinking fluid Grooming: Wash/dry face;Applying deodorant;Sitting;Set up;Supervision/safety   Upper Body Bathing: Set up;Supervision/ safety;Sitting Upper Body Bathing Details (indicate cue type and reason): assisted in moving pt's gown  Lower Body Bathing: Bed level;Maximal assistance Lower Body Bathing Details (indicate cue type and reason): washed bottom/peri area; pt able to wash some of front and dry off front Upper Body Dressing : Moderate assistance Upper Body Dressing Details (indicate cue type and reason): donning gown on back     Toilet Transfer: +2 for physical assistance;Minimal assistance;Stand-pivot;RW (from bed to chair)           Functional mobility during ADLs: +2 for physical assistance;Minimal assistance;Rolling walker (stand pivot) General ADL Comments: Pt became anxious  in session. Tried to get pt to problem solve in session with ADLs.      Vision                     Perception     Praxis      Cognition  Awake/Alert Behavior During Therapy: Anxious Overall Cognitive Status: No family/caregiver present to determine baseline cognitive functioning Area of Impairment: Safety/judgement;Problem solving;Orientation Orientation Level: Disoriented to;Time;Situation (she thought she was done with rehab)      Following Commands: Follows one step commands with increased time Safety/Judgement: Decreased awareness of safety   Problem Solving: Requires verbal cues General Comments: high anxiety with movement/mobility  Extremity/Trunk Assessment               Exercises     Shoulder Instructions       General Comments      Pertinent Vitals/ Pain       Pain Assessment: 0-10 Pain Score: 7  Pain Location: stomach   Pain Descriptors / Indicators: Aching Pain Intervention(s): Monitored during session   HR up to 144 in session and later reading 200 but unsure of accuracy.  Home Living                                          Prior Functioning/Environment              Frequency Min 2X/week     Progress Toward Goals  OT Goals(current goals can now be found in the care plan section)  Progress towards OT goals: Progressing toward goals  Acute Rehab OT Goals Patient Stated Goal: not stated OT Goal Formulation: With patient Time For Goal Achievement: 03/06/15 Potential to Achieve Goals: Good  Plan Discharge plan remains appropriate    Co-evaluation    PT/OT/SLP Co-Evaluation/Treatment: Yes (most of the session) Reason for Co-Treatment: For patient/therapist safety PT goals addressed during session: Mobility/safety with mobility OT goals addressed during session: ADL's and self-care;Other (comment) (mobility)      End of Session Equipment Utilized During Treatment: Gait belt;Rolling walker   Activity Tolerance  (anxious)   Patient Left in chair;Other (comment) (with SLP)   Nurse Communication Other (comment) (HR and sore on bottom (bleeding))        Time: [id number] OT Time Calculation (min): 28 min  Charges: OT General Charges $OT Visit: 1 Procedure OT Treatments $Self Care/Home Management : 8-22 mins  Benito Mccreedy OTR/L [id number] 02/25/2015, 10:32 AM

## 2015-02-25 NOTE — Progress Notes (Signed)
Speech Language Pathology Treatment: Dysphagia  Patient Details Name: [name] MRN: [id number] DOB: [birth year] Today's Date: 02/25/2015 Time: 8416-6063 SLP Time Calculation (min) (ACUTE ONLY): 15 min  Assessment / Plan / Recommendation Clinical Impression  Pts initiation and attention have improved since Friday, though cueing for problem solving and completion of ADL's still needed. Pts ability to eat and drink have returned to baseline; pt is masticating regular solids and swallowing without difficulty. No pocketing or oral holding observed. Recommend pt resume a regular texture diet with thin liquids. No SLP f/u needed in acute setting. Could benefit from cognitive treatment if pt transitions to CIR.    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 She then developed pseudomonal bacteremia from a right femoral HD line which has been removed and she was treated with ceftaz through 02/09/15. She was doing well in CIR until she acutely developed severe confusion and hallucinations following an HD session. Found to have UTI. Has not been treated by SLP in history. No documented history of dysphagia.       SLP Plan  All goals met     Recommendations  Diet recommendations: Regular;Thin liquid Liquids provided via: Straw Medication Administration: Whole meds with liquid Supervision: Patient able to self feed Compensations: Minimize environmental distractions Postural Changes and/or Swallow Maneuvers: Seated upright 90 degrees              Follow up Recommendations: 24 hour supervision/assistance Plan: All goals met  Herbie [place], MA CCC-SLP [phone]  Lynann Beaver 02/25/2015, 10:40 AM

## 2015-02-25 NOTE — Progress Notes (Signed)
Subjective: No complaints other than sore left LE.  More alert and oriented.   Objective: Current vital signs: BP 98/51 mmHg  Pulse 75  Temp(Src) 98.1 F (36.7 C) (Oral)  Resp 27  Ht 5\' 3"  (1.6 m)  Wt 84.5 kg (186 lb 4.6 oz)  BMI 33.01 kg/m2  SpO2 100%  LMP 01/21/2015 Vital signs in last 24 hours: Temp:  [97.4 F (36.3 C)-98.4 F (36.9 C)] 98.1 F (36.7 C) (11/28 0753) Pulse Rate:  [75-108] 75 (11/28 0515) Resp:  [18-28] 27 (11/28 0515) BP: (98-130)/(48-76) 98/51 mmHg (11/28 0515) SpO2:  [100 %] 100 % (11/28 0515) Weight:  [84.5 kg (186 lb 4.6 oz)] 84.5 kg (186 lb 4.6 oz) (11/28 0500)  Intake/Output from previous day: 11/27 0701 - 11/28 0700 In: 567.3 [P.O.:120; I.V.:247.3; IV Piggyback:200] Out: 126 [Urine:125; Stool:1] Intake/Output this shift:   Nutritional status: Diet regular Room service appropriate?: Yes; Fluid consistency:: Thin  Neurologic Exam: General: NAD Mental Status: Alert, oriented, thought content appropriate.  Speech fluent without evidence of aphasia.  Able to follow 3 step commands without difficulty. Cranial Nerves: II: Discs flat bilaterally; Visual fields grossly normal, pupils equal, round, reactive to light and accommodation III,IV, VI: ptosis not present, extra-ocular motions intact bilaterally V,VII: smile asymmetric right lower face, facial light touch sensation normal bilaterally VIII: hearing normal bilaterally IX,X: uvula rises symmetrically XI: bilateral shoulder shrug XII: midline tongue extension without atrophy or fasciculations  Motor: Continues to have diffuse weakness through out with 3+/5 strength.  Sensory: Pinprick and light touch intact throughout, bilaterally Deep Tendon Reflexes:  1+ in UE no LE DTR Plantars: Right: downgoing   Left: downgoing Cerebellar: normal finger-to-nose,    Lab Results: Basic Metabolic Panel:  Recent Labs Lab 02/18/15 1615 02/19/15 1255 02/20/15 0337 02/21/15 0347 02/23/15 1155  02/24/15 0417  NA 135 139 137 137 137 137  K 3.2* 4.0 4.7 3.8 3.5 3.8  CL 100* 100* 100* 100* 102 99*  CO2 26 29 28 31 26 29   GLUCOSE 93 107* 85 61* 96 72  BUN 9 6 9 9 16 16   CREATININE 5.06* 3.97* 4.68* 4.05* 4.55* 4.92*  CALCIUM 8.5* 8.6* 8.6* 8.3* 8.3* 8.4*  MG  --  1.8  --   --   --   --   PHOS 2.9 2.5  --  2.7 2.8 4.2    Liver Function Tests:  Recent Labs Lab 02/18/15 1615 02/19/15 1255 02/21/15 0347 02/23/15 1155 02/24/15 0417  AST  --  32  --   --   --   ALT  --  18  --   --   --   ALKPHOS  --  101  --   --   --   BILITOT  --  0.7  --   --   --   PROT  --  5.7*  --   --   --   ALBUMIN 1.9* 2.3* 1.7* 1.6* 1.8*   No results for input(s): LIPASE, AMYLASE in the last 168 hours. No results for input(s): AMMONIA in the last 168 hours.  CBC:  Recent Labs Lab 02/19/15 1255  02/20/15 0337 02/21/15 0347 02/23/15 0239 02/24/15 0417 02/25/15 0435  WBC 7.4  < > 8.5 5.3 18.0* 10.1 9.1  NEUTROABS 6.2  --  7.6  --   --   --   --   HGB 10.5*  < > 10.3* 8.8* 11.1* 8.9* 8.4*  HCT 34.7*  < > 35.4* 29.6* 36.3 29.4* 27.5*  MCV 96.1  < > 98.3 97.0 93.8 95.1 95.2  PLT 66*  < > 156 115* 163 122* 131*  < > = values in this interval not displayed.  Cardiac Enzymes:  Recent Labs Lab 02/22/15 2244  CKTOTAL 14*    Lipid Panel: No results for input(s): CHOL, TRIG, HDL, CHOLHDL, VLDL, LDLCALC in the last 168 hours.  CBG:  Recent Labs Lab 02/19/15 0233 02/21/15 2211  GLUCAP 72 125*    Microbiology: Results for orders placed or performed during the hospital encounter of 02/19/15  Urine culture     Status: None   Collection Time: 02/20/15 12:35 PM  Result Value Ref Range Status   Specimen Description URINE, CATHETERIZED  Final   Special Requests NONE  Final   Culture   Final    >=100,000 COLONIES/mL VANCOMYCIN RESISTANT ENTEROCOCCUS >=100,000 COLONIES/mL ACINETOBACTER CALCOACETICUS/BAUMANNII COMPLEX    Report Status 02/24/2015 FINAL  Final   Organism ID,  Bacteria ACINETOBACTER CALCOACETICUS/BAUMANNII COMPLEX  Final   Organism ID, Bacteria VANCOMYCIN RESISTANT ENTEROCOCCUS  Final      Susceptibility   Acinetobacter calcoaceticus/baumannii complex - MIC*    CEFTAZIDIME >=64 RESISTANT Resistant     CEFTRIAXONE >=64 RESISTANT Resistant     CIPROFLOXACIN >=4 RESISTANT Resistant     GENTAMICIN 2 SENSITIVE Sensitive     IMIPENEM 2 SENSITIVE Sensitive     PIP/TAZO 32 INTERMEDIATE Intermediate     TRIMETH/SULFA >=320 RESISTANT Resistant     AMPICILLIN/SULBACTAM 4 SENSITIVE Sensitive     * >=100,000 COLONIES/mL ACINETOBACTER CALCOACETICUS/BAUMANNII COMPLEX   Vancomycin resistant enterococcus - MIC*    AMPICILLIN <=2 SENSITIVE Sensitive     LEVOFLOXACIN >=8 RESISTANT Resistant     NITROFURANTOIN <=16 SENSITIVE Sensitive     VANCOMYCIN >=32 RESISTANT Resistant     LINEZOLID 2 SENSITIVE Sensitive     * >=100,000 COLONIES/mL VANCOMYCIN RESISTANT ENTEROCOCCUS  Culture, blood (routine x 2)     Status: None (Preliminary result)   Collection Time: 02/20/15  3:30 PM  Result Value Ref Range Status   Specimen Description BLOOD VENOUS  Final   Special Requests BOTTLES DRAWN AEROBIC AND ANAEROBIC 10ML  Final   Culture NO GROWTH 4 DAYS  Final   Report Status PENDING  Incomplete  Culture, blood (routine x 2)     Status: None (Preliminary result)   Collection Time: 02/20/15  4:16 PM  Result Value Ref Range Status   Specimen Description BLOOD ARTERIAL LINE  Final   Special Requests BOTTLES DRAWN AEROBIC AND ANAEROBIC 10ML  Final   Culture NO GROWTH 4 DAYS  Final   Report Status PENDING  Incomplete  Culture, blood (routine x 2)     Status: None (Preliminary result)   Collection Time: 02/21/15  8:36 PM  Result Value Ref Range Status   Specimen Description BLOOD RIGHT HAND  Final   Special Requests IN PEDIATRIC BOTTLE 3CC  Final   Culture NO GROWTH 3 DAYS  Final   Report Status PENDING  Incomplete  Culture, blood (routine x 2)     Status: None  (Preliminary result)   Collection Time: 02/21/15  9:21 PM  Result Value Ref Range Status   Specimen Description BLOOD RIGHT THUMB  Final   Special Requests IN PEDIATRIC BOTTLE 1.5CC  Final   Culture NO GROWTH 3 DAYS  Final   Report Status PENDING  Incomplete    Coagulation Studies:  Recent Labs  02/22/15 0950 02/23/15 0239 02/23/15 2030 02/24/15 0417 02/25/15 0435  LABPROT 23.1* 25.3*  23.8* 21.4* 25.2*  INR 2.06* 2.32* 2.14* 1.87* 2.31*    Imaging: Dg Knee Left Port  02/24/2015  CLINICAL DATA:  LEFT knee pain and thigh pain EXAM: PORTABLE LEFT KNEE - 1-2 VIEW COMPARISON:  None. FINDINGS: No fracture of the proximal tibia or distal femur. Patella is normal. No joint effusion. IMPRESSION: No fracture or dislocation.  No joint effusion. Electronically Signed   By: Suzy Bouchard M.D.   On: 02/24/2015 11:35    Medications:  Scheduled: . ampicillin-sulbactam (UNASYN) IV  3 g Intravenous Q24H  . [START ON 03/02/2015] Darbepoetin Alfa  200 mcg Intravenous Q Sat-HD  . metoprolol tartrate  25 mg Oral BID  . midodrine  10 mg Oral BID WC  . pantoprazole  40 mg Oral Daily  . sodium chloride  3 mL Intravenous Q12H    Assessment/Plan:  39 year old lady with hypertension, end-stage renal disease on dialysis, systemic lupus and history of left cerebral infarctions, with encephalopathic state associated with acute urinary tract infection and possible sepsis. Mental status has improved, no confabulations noted today. No new neurological symptoms. No further neurological recommendations at this time .  Continue to treat underlying infection. Neurology will S/O   Patient evaluated along with physician assistant, Etta Quill. Helped him formulate the plan. Agree with the documented assessment.

## 2015-02-25 NOTE — Progress Notes (Signed)
Physical Therapy Treatment Patient Details Name: [name] MRN: [id number] DOB: 25-Sep-1975 Today's Date: 02/25/2015    History of Present Illness Pt is a 39 y.o. F from CIR who has had several recent hospitalizations.  She had a Rt MCA infarction and developed pseudomonal bacteremia from Rt femoral HD line which was treated.  Was doing well in CIR until she acutely developed severe confusion and hallucinations following HD sessions, likely a result of sepsis from Lt ant thigh abscess. Pt's additional PMH includes lupus, fibromyalgia, anemia.    PT Comments    Pt awake and alert however with high anxiety regarding OOB mobility. Pt able to transfer to chair with minA and max encouragement however HR went from 93 to 144 at the thought of ambulating to the door instead of sitting in chair. Suspect pt could physically tolerate ambulation with RW if anxiety was under better control.   Follow Up Recommendations  CIR;Supervision/Assistance - 24 hour     Equipment Recommendations       Recommendations for Other Services Rehab consult     Precautions / Restrictions Precautions Precautions: Fall Precaution Comments: pt with high anxiety Restrictions Weight Bearing Restrictions: No    Mobility  Bed Mobility Overal bed mobility: Needs Assistance Bed Mobility: Rolling;Sidelying to Sit Rolling: Mod assist Sidelying to sit: Mod assist       General bed mobility comments: pt desired to roll to R despite not having any room and required modA to preven fall off bed and to get into sidelying, max directional cues to push up from elbow and hand to push into sitting, modA for trunk elevation  Transfers Overall transfer level: Needs assistance Equipment used: Rolling walker (2 wheeled) Transfers: Sit to/from Stand Sit to Stand: +2 physical assistance;Min assist Stand pivot transfers: +2 physical assistance;Min assist       General transfer comment: max directional v/c's for walker  management, pt desired not to use however noted from previous PT notes pt required RW for safe amb  Ambulation/Gait             General Gait Details: unable to ambulate due to pts high anxiety, HR going from 93 to 144. pt repeatedly saying "i need to sit, i need to sit" despite taking steps with minA. Pt assisted to chair. Pt unwilling to attempt ambulation at this time despite max encouragement and v/c's for deep breathing to assist in relaxing pt   Stairs            Wheelchair Mobility    Modified Rankin (Stroke Patients Only) Modified Rankin (Stroke Patients Only) Pre-Morbid Rankin Score: Moderate disability Modified Rankin: Moderately severe disability     Balance Overall balance assessment: Needs assistance Sitting-balance support: Feet supported;No upper extremity supported Sitting balance-Leahy Scale: Fair     Standing balance support: No upper extremity supported Standing balance-Leahy Scale: Fair                      Cognition Arousal/Alertness: Awake/alert Behavior During Therapy: Anxious Overall Cognitive Status: No family/caregiver present to determine baseline cognitive functioning Area of Impairment: Safety/judgement;Problem solving;Orientation Orientation Level: Disoriented to;Time;Situation (she thought she was done with rehab)     Following Commands: Follows one step commands with increased time Safety/Judgement: Decreased awareness of safety   Problem Solving: Requires verbal cues;Requires tactile cues General Comments: high anxiety with all move mvmt/mobility    Exercises      General Comments        Pertinent Vitals/Pain  Pain Assessment: 0-10 Pain Score: 7  Pain Location: stomach Pain Descriptors / Indicators: Aching Pain Intervention(s): Monitored during session    Home Living                      Prior Function            PT Goals (current goals can now be found in the care plan section) Acute Rehab PT  Goals Patient Stated Goal: not stated Progress towards PT goals: Progressing toward goals    Frequency  Min 3X/week    PT Plan Current plan remains appropriate    Co-evaluation PT/OT/SLP Co-Evaluation/Treatment: Yes Reason for Co-Treatment: Complexity of the patient's impairments (multi-system involvement);For patient/therapist safety PT goals addressed during session: Mobility/safety with mobility OT goals addressed during session: ADL's and self-care;Other (comment) (mobility)     End of Session Equipment Utilized During Treatment: Oxygen Activity Tolerance:  (limited by anxiety) Patient left: in chair;with call bell/phone within reach (with OT and SLP)     Time: [id number] PT Time Calculation (min) (ACUTE ONLY): 26 min  Charges:  $Therapeutic Activity: 8-22 mins                    G Codes:      Kingsley Callander 02/25/2015, 10:01 AM   Kittie Plater, PT, DPT Pager #: [phone] Office #: [phone]

## 2015-02-25 NOTE — Progress Notes (Signed)
  [facility] Progress Note   Subjective: L knee hurts, o/w no complaints  Filed Vitals:   02/24/15 2340 02/25/15 0500 02/25/15 0515 02/25/15 0753  BP: 105/48  98/51 113/66  Pulse: 83  75 81  Temp: 97.4 F (36.3 C)  97.6 F (36.4 C) 98.1 F (36.7 C)  TempSrc: Oral  Oral Oral  Resp: 18  27 20   Height:      Weight:  84.5 kg (186 lb 4.6 oz)    SpO2: 100%  100% 100%   . ampicillin-sulbactam (UNASYN) IV  3 g Intravenous Q24H  . [START ON 03/02/2015] Darbepoetin Alfa  200 mcg Intravenous Q Sat-HD  . hydroxychloroquine  400 mg Oral Daily  . [START ON 02/26/2015] metoprolol succinate  25 mg Oral Daily  . midodrine  10 mg Oral BID WC  . pantoprazole  40 mg Oral Daily  . sodium chloride  3 mL Intravenous Q12H     acetaminophen, acetaminophen, HYDROmorphone (DILAUDID) injection, Influenza vac split quadrivalent PF, ondansetron **OR** ondansetron (ZOFRAN) IV, pneumococcal 23 valent vaccine, polyethylene glycol Exam: WD [name] in NAD No jvd Chest clear bilat RRR no mrg Abd obese ntnd Ext 2+ L thigh edema, no fluctuance or drainage AVG L thigh +bruit Neuro gen weak, ox 3  [facility] TTS 4 hr   87.5kg 2/2 bath L thigh AVG Hep 2600 Aranesp 200/week      Assessment: 1 AMS d/t UTI, new CVA, meds-improving 2 UTI MDR on Unasyn 3 SLE no meds currently 4 SP CVA/ endocarditis (marantic) 5 Debil 6 Hx SVT on MTP 7 ESRD on HD TTS using L thigh AVG 8 Hypotens on midodrine 9 Anemia max darbe  Plan - HD Tuesday   Kelly Splinter MD [facility] pager [phone]    cell [phone] 02/25/2015, 11:04 AM    Recent Labs Lab 02/21/15 0347 02/23/15 1155 02/24/15 0417  NA 137 137 137  K 3.8 3.5 3.8  CL 100* 102 99*  CO2 31 26 29   GLUCOSE 61* 96 72  BUN 9 16 16   CREATININE 4.05* 4.55* 4.92*  CALCIUM 8.3* 8.3* 8.4*  PHOS 2.7 2.8 4.2    Recent Labs Lab 02/19/15 1255 02/21/15 0347 02/23/15 1155 02/24/15 0417  AST 32  --   --   --   ALT 18  --   --    --   ALKPHOS 101  --   --   --   BILITOT 0.7  --   --   --   PROT 5.7*  --   --   --   ALBUMIN 2.3* 1.7* 1.6* 1.8*    Recent Labs Lab 02/19/15 1255  02/20/15 0337  02/23/15 0239 02/24/15 0417 02/25/15 0435  WBC 7.4  < > 8.5  < > 18.0* 10.1 9.1  NEUTROABS 6.2  --  7.6  --   --   --   --   HGB 10.5*  < > 10.3*  < > 11.1* 8.9* 8.4*  HCT 34.7*  < > 35.4*  < > 36.3 29.4* 27.5*  MCV 96.1  < > 98.3  < > 93.8 95.1 95.2  PLT 66*  < > 156  < > 163 122* 131*  < > = values in this interval not displayed.

## 2015-02-26 ENCOUNTER — Encounter (HOSPITAL_COMMUNITY): Payer: Self-pay | Admitting: *Deleted

## 2015-02-26 ENCOUNTER — Inpatient Hospital Stay (HOSPITAL_COMMUNITY): Payer: Medicaid Other

## 2015-02-26 DIAGNOSIS — M79605 Pain in left leg: Secondary | ICD-10-CM

## 2015-02-26 DIAGNOSIS — M7989 Other specified soft tissue disorders: Secondary | ICD-10-CM

## 2015-02-26 LAB — RENAL FUNCTION PANEL
ALBUMIN: 1.5 g/dL — AB (ref 3.5–5.0)
Anion gap: 10 (ref 5–15)
BUN: 36 mg/dL — AB (ref 6–20)
CALCIUM: 8.3 mg/dL — AB (ref 8.9–10.3)
CO2: 29 mmol/L (ref 22–32)
Chloride: 99 mmol/L — ABNORMAL LOW (ref 101–111)
Creatinine, Ser: 6.98 mg/dL — ABNORMAL HIGH (ref 0.44–1.00)
GFR calc Af Amer: 8 mL/min — ABNORMAL LOW (ref >60)
GFR calc non Af Amer: 7 mL/min — ABNORMAL LOW (ref >60)
GLUCOSE: 76 mg/dL (ref 65–99)
PHOSPHORUS: 4.1 mg/dL (ref 2.5–4.6)
Potassium: 4 mmol/L (ref 3.5–5.1)
SODIUM: 138 mmol/L (ref 135–145)

## 2015-02-26 LAB — CBC
HCT: 29.6 % — ABNORMAL LOW (ref 36.0–46.0)
Hemoglobin: 8.8 g/dL — ABNORMAL LOW (ref 12.0–15.0)
MCH: 27.8 pg (ref 26.0–34.0)
MCHC: 29.7 g/dL — ABNORMAL LOW (ref 30.0–36.0)
MCV: 93.4 fL (ref 78.0–100.0)
Platelets: 140 K/uL — ABNORMAL LOW (ref 150–400)
RBC: 3.17 MIL/uL — ABNORMAL LOW (ref 3.87–5.11)
RDW: 16.6 % — ABNORMAL HIGH (ref 11.5–15.5)
WBC: 7.7 K/uL (ref 4.0–10.5)

## 2015-02-26 LAB — CULTURE, BLOOD (ROUTINE X 2)
CULTURE: NO GROWTH
Culture: NO GROWTH

## 2015-02-26 LAB — PROTIME-INR
INR: 2.16 — ABNORMAL HIGH (ref 0.00–1.49)
Prothrombin Time: 23.9 s — ABNORMAL HIGH (ref 11.6–15.2)

## 2015-02-26 MED ORDER — ENSURE ENLIVE PO LIQD: 237.0000 mL | Freq: Two times a day (BID) | ORAL | Status: DC

## 2015-02-26 MED ORDER — WARFARIN SODIUM 5 MG PO TABS
5.0000 mg | ORAL_TABLET | Freq: Once | ORAL | Status: AC
  Administered 2015-02-26: 5 mg via ORAL
  Filled 2015-02-26: qty 1

## 2015-02-26 NOTE — Progress Notes (Signed)
  [facility] Progress Note  Subjective  - Doing ok.   Objective Filed Vitals:   02/26/15 0527 02/26/15 0834  BP: 122/77 115/78  Pulse: 88 76  Temp: 98.3 F (36.8 C) 98.1 F (36.7 C)  Resp: 21 19    Intake/Output Summary (Last 24 hours) at 02/26/15 0942 Last data filed at 02/26/15 0857  Gross per 24 hour  Intake    186 ml  Output      0 ml  Net    186 ml    Left thigh with some edema but no cellulitis, drainage or fluctuance.  Palpable left thigh graft thrill   Assessment/Planning: 39 y.o. female with acute delirium secondary to UTI  Clinical status improving.  Edema of left thigh but graft site does not appear to be infected.   Alvia Grove 02/26/2015 9:42 AM --  Laboratory CBC    Component Value Date/Time   WBC 7.7 02/26/2015 0438   HGB 8.8* 02/26/2015 0438   HCT 29.6* 02/26/2015 0438   PLT 140* 02/26/2015 0438    BMET    Component Value Date/Time   NA 138 02/26/2015 0438   K 4.0 02/26/2015 0438   CL 99* 02/26/2015 0438   CO2 29 02/26/2015 0438   GLUCOSE 76 02/26/2015 0438   BUN 36* 02/26/2015 0438   CREATININE 6.98* 02/26/2015 0438   CALCIUM 8.3* 02/26/2015 0438   GFRNONAA 7* 02/26/2015 0438   GFRAA 8* 02/26/2015 0438    COAG Lab Results  Component Value Date   INR 2.16* 02/26/2015   INR 2.31* 02/25/2015   INR 1.87* 02/24/2015   No results found for: PTT  Antibiotics Anti-infectives    Start     Dose/Rate Route Frequency Ordered Stop   02/25/15 1230  hydroxychloroquine (PLAQUENIL) tablet 400 mg     400 mg Oral Daily 02/25/15 1103     02/22/15 1800  Ampicillin-Sulbactam (UNASYN) 3 g in sodium chloride 0.9 % 100 mL IVPB     3 g 100 mL/hr over 60 Minutes Intravenous Every 24 hours 02/22/15 0909     02/20/15 1500  vancomycin (VANCOCIN) IVPB 1000 mg/200 mL premix     1,000 mg 200 mL/hr over 60 Minutes Intravenous To Hemodialysis 02/20/15 1412 02/20/15 1956   02/20/15 1430  hydroxychloroquine (PLAQUENIL) tablet 400  mg  Status:  Discontinued     400 mg Oral Daily 02/20/15 1425 02/22/15 1452   02/19/15 1600  hydroxychloroquine (PLAQUENIL) tablet 400 mg  Status:  Discontinued     400 mg Oral Daily 02/19/15 1119 02/20/15 1425   02/19/15 1400  cefTAZidime (FORTAZ) 1 g in dextrose 5 % 50 mL IVPB  Status:  Discontinued     1 g 100 mL/hr over 30 Minutes Intravenous Every 24 hours 02/19/15 1248 02/22/15 0847   02/19/15 1400  vancomycin (VANCOCIN) 1,750 mg in sodium chloride 0.9 % 500 mL IVPB     1,750 mg 250 mL/hr over 120 Minutes Intravenous  Once 02/19/15 1248 02/19/15 [place], PA-C [facility] Office: [phone] Pager: [phone] 02/26/2015 9:42 AM

## 2015-02-26 NOTE — Progress Notes (Signed)
  [facility] Progress Note   Subjective: having hallucinations, paranoia, confusion.   Filed Vitals:   02/26/15 1237 02/26/15 1307 02/26/15 1321 02/26/15 1432  BP: 113/67 113/56 129/79 104/73  Pulse: 102 95 85 93  Temp:   98.5 F (36.9 C) 97.8 F (36.6 C)  TempSrc:   Oral Oral  Resp:   22 21  Height:      Weight:   83.6 kg (184 lb 4.9 oz)   SpO2:   100%    . ampicillin-sulbactam (UNASYN) IV  3 g Intravenous Q24H  . [START ON 03/02/2015] Darbepoetin Alfa  200 mcg Intravenous Q Sat-HD  . hydroxychloroquine  400 mg Oral Daily  . metoprolol succinate  25 mg Oral Daily  . midodrine  10 mg Oral BID WC  . pantoprazole  40 mg Oral Daily  . sodium chloride  3 mL Intravenous Q12H  . Warfarin - Pharmacist Dosing Inpatient   Does not apply q1800     acetaminophen, acetaminophen, HYDROmorphone (DILAUDID) injection, Influenza vac split quadrivalent PF, ondansetron **OR** ondansetron (ZOFRAN) IV, pneumococcal 23 valent vaccine, polyethylene glycol Exam: WD [name] in NAD No jvd Chest clear bilat RRR no mrg Abd obese ntnd Ext 2+ L thigh edema, no fluctuance or drainage AVG L thigh +bruit Neuro gen weak, ox 3  [facility] TTS 4 hr   87.5kg 2/2 bath L thigh AVG Hep 2600 Aranesp 200/week      Assessment: 1 AMS now w hallucinations; meds reviewed nothing noxious 2 Acinetobacter UTI on Unasyn 2 ESRD TTS hd using thigh graft 3 SLE no meds currently 4 SP CVA/ endocarditis (marantic) 5 Debil 6 Hx SVT on MTP 7 Hypotens on midodrine 8 Anemia max darbe  Plan - per primary.  Hd today. Then next HD Thursday   [facility] pager [phone]    cell [phone] 02/26/2015, 3:07 PM    Recent Labs Lab 02/23/15 1155 02/24/15 0417 02/26/15 0438  NA 137 137 138  K 3.5 3.8 4.0  CL 102 99* 99*  CO2 26 29 29   GLUCOSE 96 72 76  BUN 16 16 36*  CREATININE 4.55* 4.92* 6.98*  CALCIUM 8.3* 8.4* 8.3*  PHOS 2.8 4.2 4.1    Recent Labs Lab  02/23/15 1155 02/24/15 0417 02/26/15 0438  ALBUMIN 1.6* 1.8* 1.5*    Recent Labs Lab 02/20/15 0337  02/24/15 0417 02/25/15 0435 02/26/15 0438  WBC 8.5  < > 10.1 9.1 7.7  NEUTROABS 7.6  --   --   --   --   HGB 10.3*  < > 8.9* 8.4* 8.8*  HCT 35.4*  < > 29.4* 27.5* 29.6*  MCV 98.3  < > 95.1 95.2 93.4  PLT 156  < > 122* 131* 140*  < > = values in this interval not displayed.

## 2015-02-26 NOTE — Progress Notes (Signed)
Subjective: No acute events overnight. She has been more paranoid since yesterday afternoon, with a primary delusion of her child's father coming after her and threatening her. Her left leg is also painful from the knee to the foot in particular. Otherwise she was feeling well without fever or shortness of breath.  Objective: Vital signs in last 24 hours: Filed Vitals:   02/26/15 1237 02/26/15 1307 02/26/15 1321 02/26/15 1432  BP: 113/67 113/56 129/79 104/73  Pulse: 102 95 85 93  Temp:   98.5 F (36.9 C) 97.8 F (36.6 C)  TempSrc:   Oral Oral  Resp:   22 21  Height:      Weight:   83.6 kg (184 lb 4.9 oz)   SpO2:   100%    Weight change:   Intake/Output Summary (Last 24 hours) at 02/26/15 1540 Last data filed at 02/26/15 1321  Gross per 24 hour  Intake    120 ml  Output    975 ml  Net   -855 ml   GENERAL- obese, ill appearing, alert, NAD, co-operative HEENT- oral mucosa appears moist CARDIAC- RRR, no murmurs, rubs or gallops. RESP- CTAB, no crackles or wheezes ABDOMEN- Soft, nontender NEURO- Left toes tender to light manipulation, does not wiggle toes well on command, raises left leg and arm on request EXTREMITIES- 3+ edema of the left thigh around graft site, 1+ distal LLE edema, left calf and foot tender to palpation PSYCH- Flat affect, persistent paranoid delusion of child's father threatening her in the hospital  Lab Results: Basic Metabolic Panel:  Recent Labs Lab 02/24/15 0417 02/26/15 0438  NA 137 138  K 3.8 4.0  CL 99* 99*  CO2 29 29  GLUCOSE 72 76  BUN 16 36*  CREATININE 4.92* 6.98*  CALCIUM 8.4* 8.3*  PHOS 4.2 4.1   Liver Function Tests:  Recent Labs Lab 02/24/15 0417 02/26/15 0438  ALBUMIN 1.8* 1.5*  CBC:  Recent Labs Lab 02/20/15 0337  02/25/15 0435 02/26/15 0438  WBC 8.5  < > 9.1 7.7  NEUTROABS 7.6  --   --   --   HGB 10.3*  < > 8.4* 8.8*  HCT 35.4*  < > 27.5* 29.6*  MCV 98.3  < > 95.2 93.4  PLT 156  < > 131* 140*  < > = values  in this interval not displayed. CBG:  Recent Labs Lab 02/21/15 2211  GLUCAP 125*   Thyroid Function Tests: No results for input(s): TSH, T4TOTAL, FREET4, T3FREE, THYROIDAB in the last 168 hours. Coagulation:  Recent Labs Lab 02/23/15 2030 02/24/15 0417 02/25/15 0435 02/26/15 0438  LABPROT 23.8* 21.4* 25.2* 23.9*  INR 2.14* 1.87* 2.31* 2.16*    Micro Results: Recent Results (from the past 240 hour(s))  Culture, blood (routine x 2)     Status: None   Collection Time: 02/19/15  9:50 AM  Result Value Ref Range Status   Specimen Description BLOOD RIGHT ANTECUBITAL  Final   Special Requests BOTTLES DRAWN AEROBIC AND ANAEROBIC 5CCS  Final   Culture NO GROWTH 5 DAYS  Final   Report Status 02/24/2015 FINAL  Final  Culture, blood (routine x 2)     Status: None   Collection Time: 02/19/15 10:00 AM  Result Value Ref Range Status   Specimen Description BLOOD RIGHT HAND  Final   Special Requests BOTTLES DRAWN AEROBIC AND ANAEROBIC 5CCS  Final   Culture NO GROWTH 5 DAYS  Final   Report Status 02/24/2015 FINAL  Final  Urine culture     Status: None   Collection Time: 02/20/15 12:35 PM  Result Value Ref Range Status   Specimen Description URINE, CATHETERIZED  Final   Special Requests NONE  Final   Culture   Final    >=100,000 COLONIES/mL VANCOMYCIN RESISTANT ENTEROCOCCUS >=100,000 COLONIES/mL ACINETOBACTER CALCOACETICUS/BAUMANNII COMPLEX    Report Status 02/24/2015 FINAL  Final   Organism ID, Bacteria ACINETOBACTER CALCOACETICUS/BAUMANNII COMPLEX  Final   Organism ID, Bacteria VANCOMYCIN RESISTANT ENTEROCOCCUS  Final      Susceptibility   Acinetobacter calcoaceticus/baumannii complex - MIC*    CEFTAZIDIME >=64 RESISTANT Resistant     CEFTRIAXONE >=64 RESISTANT Resistant     CIPROFLOXACIN >=4 RESISTANT Resistant     GENTAMICIN 2 SENSITIVE Sensitive     IMIPENEM 2 SENSITIVE Sensitive     PIP/TAZO 32 INTERMEDIATE Intermediate     TRIMETH/SULFA >=320 RESISTANT Resistant      AMPICILLIN/SULBACTAM 4 SENSITIVE Sensitive     * >=100,000 COLONIES/mL ACINETOBACTER CALCOACETICUS/BAUMANNII COMPLEX   Vancomycin resistant enterococcus - MIC*    AMPICILLIN <=2 SENSITIVE Sensitive     LEVOFLOXACIN >=8 RESISTANT Resistant     NITROFURANTOIN <=16 SENSITIVE Sensitive     VANCOMYCIN >=32 RESISTANT Resistant     LINEZOLID 2 SENSITIVE Sensitive     * >=100,000 COLONIES/mL VANCOMYCIN RESISTANT ENTEROCOCCUS  Culture, blood (routine x 2)     Status: None   Collection Time: 02/20/15  3:30 PM  Result Value Ref Range Status   Specimen Description BLOOD VENOUS  Final   Special Requests BOTTLES DRAWN AEROBIC AND ANAEROBIC 10ML  Final   Culture NO GROWTH 5 DAYS  Final   Report Status 02/25/2015 FINAL  Final  Culture, blood (routine x 2)     Status: None   Collection Time: 02/20/15  4:16 PM  Result Value Ref Range Status   Specimen Description BLOOD ARTERIAL LINE  Final   Special Requests BOTTLES DRAWN AEROBIC AND ANAEROBIC 10ML  Final   Culture NO GROWTH 5 DAYS  Final   Report Status 02/25/2015 FINAL  Final  Culture, blood (routine x 2)     Status: None   Collection Time: 02/21/15  8:36 PM  Result Value Ref Range Status   Specimen Description BLOOD RIGHT HAND  Final   Special Requests IN PEDIATRIC BOTTLE 3CC  Final   Culture NO GROWTH 5 DAYS  Final   Report Status 02/26/2015 FINAL  Final  Culture, blood (routine x 2)     Status: None   Collection Time: 02/21/15  9:21 PM  Result Value Ref Range Status   Specimen Description BLOOD RIGHT THUMB  Final   Special Requests IN PEDIATRIC BOTTLE 1.5CC  Final   Culture NO GROWTH 5 DAYS  Final   Report Status 02/26/2015 FINAL  Final   Studies/Results: No results found. Medications: I have reviewed the patient's current medications. Scheduled Meds: . ampicillin-sulbactam (UNASYN) IV  3 g Intravenous Q24H  . [START ON 03/02/2015] Darbepoetin Alfa  200 mcg Intravenous Q Sat-HD  . hydroxychloroquine  400 mg Oral Daily  . metoprolol  succinate  25 mg Oral Daily  . midodrine  10 mg Oral BID WC  . pantoprazole  40 mg Oral Daily  . sodium chloride  3 mL Intravenous Q12H  . warfarin  5 mg Oral ONCE-1800  . Warfarin - Pharmacist Dosing Inpatient   Does not apply q1800   Continuous Infusions:   PRN Meds:.acetaminophen, acetaminophen, HYDROmorphone (DILAUDID) injection, Influenza vac split quadrivalent PF, ondansetron **OR**  ondansetron (ZOFRAN) IV, pneumococcal 23 valent vaccine, polyethylene glycol Assessment/Plan: Acute delirium 2/2 UTI: Improving on Unasyn, initial choice of empiric abtx was not covering for VRE or MDR acinetobacter. Afebrile, heart rate down, WBC trending down. Delusion worsened somewhat today, consistent with her paranoia since about yesterday morning. Vitals are improved, mental status needs to recover more before she is clinically recovered from this infection. On day 5 of appropriate abtx coverage. -qAM CBC, renal labs -Continue Unasyn 11/25>> -Tylenol prn for fever -Dilaudid 1 mg IV Q4H PRN pain  Left leg edema: Left calf and foot tender to palpation, may be related to her edema and being stationary but she is also at highly increased thrombosis risk. Thigh graft site still very edematous but no new evidence of inflammation -Obtain US for LEDVT -Continuing abtx as above  ESRD on HD:  Back on her outpatient TuThSa schedule. HD volume removal continues to be limited by chest pain or hypotension. -Midodrine 10mg  BIDWM at least on HD days, for hypotension -Nephrology following, appreciate recommendations  -HD per nephrology  H/o recent R. MCA Embolic CVA: Needs ongoing PT/OT work, and is likely a candidate for CIR again due to loss of her previous progress with this medical admission and infection -Daily PT/INR -Coumadin -Still needs daily PT/OT treatment  SLE: Continue Plaquenil H/o SVT/Tachycardia: Continue Toprol-XL 25mg   Diet: Renal DVT ppx: Coumadin FULL CODE  Dispo: Disposition is  deferred at this time, awaiting improvement of current medical problems. Anticipated discharge will depend on treatment plan after further evaluation, approximately 1-3 days.  The patient does have a current PCP Lin Landsman, MD) and does need an [facility] hospital follow-up appointment after discharge.  The patient does not have transportation limitations that hinder transportation to clinic appointments.   LOS: 7 days   Collier Salina, MD 02/26/2015, 3:40 PM

## 2015-02-26 NOTE — Progress Notes (Signed)
PT Cancellation Note  Patient Details Name: [name] MRN: [id number] DOB: 04/01/1975   Cancelled Treatment:    Reason Eval/Treat Not Completed: Patient at procedure or test/unavailable. Pt in HD. PT to return as able.   Kingsley Callander 02/26/2015, 10:55 AM   Kittie Plater, PT, DPT Pager #: [phone] Office #: [phone]

## 2015-02-26 NOTE — Progress Notes (Signed)
VASCULAR LAB PRELIMINARY  PRELIMINARY  PRELIMINARY  PRELIMINARY  Left lower extremity venous duplex completed.    Preliminary report:  Left:  No obvious evidence of DVT, superficial thrombosis, or Baker's cyst.  Unable to image FV except in the distal thight due to HD graft.    There is fluid around the graft and a large area of mixed echoes in the groin.  [name], RVT 02/26/2015, 4:59 PM

## 2015-02-26 NOTE — Progress Notes (Signed)
Prior to going to HD and after returning from HD, patient has been having paranoid hallucinations. She continues to ask about the person walking by her room with a shotgun, the gunshots she keeps hearing, the man who keeps walking into her room threatening to kill her, and also stating that she's very afraid. Dr. Benjamine Mola informed of all of the above; he stated he is aware and this has been ongoing for a few days now. Will continue to reassure and monitor patient.  Joellen Jersey, RN.

## 2015-02-26 NOTE — Progress Notes (Signed)
ANTICOAGULATION CONSULT NOTE - Follow Up Consult  Pharmacy Consult for Coumadin Indication: stroke and hypercoaguable state  Allergies  Allergen Reactions  . Food Swelling    Red peppers    Patient Measurements: Height: 5\' 3"  (160 cm) Weight: 184 lb 4.9 oz (83.6 kg) (bed scale) IBW/kg (Calculated) : 52.4  Vital Signs: Temp: 97.8 F (36.6 C) (11/29 1432) Temp Source: Oral (11/29 1432) BP: 104/73 mmHg (11/29 1432) Pulse Rate: 93 (11/29 1432)  Labs:  Recent Labs  02/24/15 0417 02/24/15 1410 02/24/15 2247 02/25/15 0435 02/26/15 0438  HGB 8.9*  --   --  8.4* 8.8*  HCT 29.4*  --   --  27.5* 29.6*  PLT 122*  --   --  131* 140*  LABPROT 21.4*  --   --  25.2* 23.9*  INR 1.87*  --   --  2.31* 2.16*  HEPARINUNFRC  --  0.34 0.41 0.43  --   CREATININE 4.92*  --   --   --  6.98*    Estimated Creatinine Clearance: 11.1 mL/min (by C-G formula based on Cr of 6.98).  Assessment:  39 yr old female with ESRD on Coumadin for history of SLE, stroke, and hypercoagulable state. She had been on Coumadin 5 mg daily from 11/16-11/22/16, with gradual rise to therapeutic INR after 5 days. Coumadin held/reversed on 11/23, with possible need for lumbar puncture or other procedure.    Started on IV heparin on 11/25 when INR ~2.  INR trended back up to 2.31 on 11/28, without any Coumadin doses.  Heparin stopped and Coumadin resumed with 4 mg on 11/28, as no procedures planned.    INR remains therapeutic (2.16). CBC low stable.  Albumin down to 1.5.  Little PO intake.    Goal of Therapy:  INR 2-3 Monitor platelets by anticoagulation protocol: Yes   Plan:   Coumadin 5 mg x 1 today, to try to keep INR at goal.  Daily PT/INR for now.  Arty Baumgartner, [place] Pager: [phone] 02/26/2015,3:12 PM

## 2015-02-26 NOTE — Progress Notes (Signed)
Pt c/o chest pain while on hemodialysis that was relieved with UF off and normal saline. Pt also stated that she wanted to come off hemodialysis early. Dr. Jonnie Finner made aware. Dr. Jonnie Finner stated pt may come off machine early because she is under her dry weight and that may be the reason she is experiencing her symptoms

## 2015-02-26 NOTE — Progress Notes (Signed)
Physical Therapy Treatment Patient Details Name: Nneka Freese MRN: [id number] DOB: [birth year] Today's Date: 02/26/2015    History of Present Illness Pt is a 39 y.o. F from [facility] who has had several recent hospitalizations.  She had a Rt MCA infarction and developed pseudomonal bacteremia from Rt femoral HD line which was treated.  Was doing well in CIR until she acutely developed severe confusion and hallucinations following HD sessions, likely a result of sepsis from Lt ant thigh abscess. Pt's additional PMH includes lupus, fibromyalgia, anemia.    PT Comments    Pt refused to participate in OOB mobility this date. Pt educated on importance of OOB mobility to improved strength and endurance to inhibit further deconditioning. Pt con't to decline exercises and mobility. Pt positioned on R side to get off buttocks. Barrier cream placed. Acute PT to con't to follow to progress ambulation for transition back to CIR.  Follow Up Recommendations  CIR;Supervision/Assistance - 24 hour     Equipment Recommendations  Wheelchair (measurements PT);Wheelchair cushion (measurements PT)    Recommendations for Other Services Rehab consult     Precautions / Restrictions Precautions Precautions: Fall Precaution Comments: pt with high anxiety Restrictions Weight Bearing Restrictions: No    Mobility  Bed Mobility Overal bed mobility: Needs Assistance Bed Mobility: Rolling           General bed mobility comments: pt modA x1 to roll R to place barrier cream on pt's bottom.  Transfers                    Ambulation/Gait                 Stairs            Wheelchair Mobility    Modified Rankin (Stroke Patients Only) Modified Rankin (Stroke Patients Only) Pre-Morbid Rankin Score: Moderate disability Modified Rankin: Moderately severe disability     Balance                                    Cognition Arousal/Alertness: Awake/alert Behavior During  Therapy: Anxious Overall Cognitive Status: No family/caregiver present to determine baseline cognitive functioning                 General Comments: pt with high anxiety and is very self limiting limiting mobility advancement    Exercises      General Comments        Pertinent Vitals/Pain Pain Assessment: 0-10 Pain Score: 7  Pain Location: stomach and bottom Pain Intervention(s): Monitored during session    Home Living Family/patient expects to be discharged to:: Unsure Living Arrangements: Other relatives;Children                  Prior Function            PT Goals (current goals can now be found in the care plan section) Progress towards PT goals: Not progressing toward goals - comment (pt did not participate today)    Frequency  Min 3X/week    PT Plan Current plan remains appropriate    Co-evaluation             End of Session Equipment Utilized During Treatment: Oxygen Activity Tolerance:  (limited by anxiety) Patient left: in bed;with call bell/phone within reach     Time: 1445-1505 PT Time Calculation (min) (ACUTE ONLY): 20 min  Charges:  $Therapeutic Activity: 8-22 mins  G CodesKingsley Callander 02/26/2015, 4:29 PM   Kittie Plater, PT, DPT Pager #: [phone] Office #: [phone]

## 2015-02-27 LAB — PROTIME-INR
INR: 2.4 — ABNORMAL HIGH (ref 0.00–1.49)
PROTHROMBIN TIME: 25.9 s — AB (ref 11.6–15.2)

## 2015-02-27 LAB — CBC
HCT: 27.9 % — ABNORMAL LOW (ref 36.0–46.0)
Hemoglobin: 8.5 g/dL — ABNORMAL LOW (ref 12.0–15.0)
MCH: 28.4 pg (ref 26.0–34.0)
MCHC: 30.5 g/dL (ref 30.0–36.0)
MCV: 93.3 fL (ref 78.0–100.0)
Platelets: 110 10*3/uL — ABNORMAL LOW (ref 150–400)
RBC: 2.99 MIL/uL — AB (ref 3.87–5.11)
RDW: 16.9 % — AB (ref 11.5–15.5)
WBC: 8.8 10*3/uL (ref 4.0–10.5)

## 2015-02-27 MED ORDER — DULOXETINE HCL 20 MG PO CPEP
20.0000 mg | ORAL_CAPSULE | Freq: Every day | ORAL | Status: DC
  Administered 2015-02-27 – 2015-03-01 (×3): 20 mg via ORAL
  Filled 2015-02-27 (×3): qty 1

## 2015-02-27 MED ORDER — BOOST / RESOURCE BREEZE PO LIQD
1.0000 | Freq: Three times a day (TID) | ORAL | Status: DC
  Administered 2015-03-01 – 2015-03-08 (×6): 1 via ORAL

## 2015-02-27 MED ORDER — WARFARIN SODIUM 2.5 MG PO TABS
2.5000 mg | ORAL_TABLET | Freq: Once | ORAL | Status: AC
  Administered 2015-02-27: 2.5 mg via ORAL
  Filled 2015-02-27: qty 1

## 2015-02-27 MED ORDER — PRO-STAT SUGAR FREE PO LIQD
30.0000 mL | Freq: Two times a day (BID) | ORAL | Status: DC
  Administered 2015-03-01 – 2015-03-11 (×8): 30 mL via ORAL
  Filled 2015-02-27 (×19): qty 30

## 2015-02-27 MED ORDER — QUETIAPINE FUMARATE 25 MG PO TABS: 25.0000 mg | ORAL_TABLET | Freq: Two times a day (BID) | ORAL | Status: DC | PRN

## 2015-02-27 NOTE — Progress Notes (Signed)
Physical Therapy Treatment Patient Details Name: [name] MRN: [id number] DOB: 10-Oct-1975 Today's Date: 02/27/2015    History of Present Illness Pt is a 39 y.o. F from [facility] who has had several recent hospitalizations.  She had a Rt MCA infarction and developed pseudomonal bacteremia from Rt femoral HD line which was treated.  Was doing well in CIR until she acutely developed severe confusion and hallucinations following HD sessions, likely a result of sepsis from Lt ant thigh abscess. Pt's additional PMH includes lupus, fibromyalgia, anemia.    PT Comments    Better participation today. Tolerated bed mobility, transfer training, and therapeutic exercises well. Requires assist with each of these tasks. Took several steps towards chair but was unwilling to ambulate, complaining of fatigue, despite encouragement. Patient will continue to benefit from skilled physical therapy services to further improve independence with functional mobility.   Follow Up Recommendations  CIR;Supervision/Assistance - 24 hour     Equipment Recommendations  Wheelchair (measurements PT);Wheelchair cushion (measurements PT)    Recommendations for Other Services Rehab consult     Precautions / Restrictions Precautions Precautions: Fall Precaution Comments: pt with high anxiety Restrictions Weight Bearing Restrictions: No    Mobility  Bed Mobility Overal bed mobility: Needs Assistance Bed Mobility: Rolling Rolling: Min assist   Supine to sit: Mod assist;+2 for physical assistance     General bed mobility comments: Min assist for rolling to Lt and right using UEs to pull through PTs hand and cues to use rail as able.  Mod assist +2 for truncal support to rise to EOB. VC for sequencing of LEs out of bed with some assist for LLE. Pt able to pull through PT's hands with BIL UEs.  Transfers Overall transfer level: Needs assistance Equipment used: Rolling walker (2 wheeled) Transfers: Sit to/from  Stand Sit to Stand: Min assist;+2 safety/equipment         General transfer comment: Physical assist for boost to stand. Required multiple cues for hand placement and initiation. No buckling once upright but using back of knees on bed for stability with some Rt leaning until cued for correction.  Ambulation/Gait Ambulation/Gait assistance: Min assist Ambulation Distance (Feet): 2 Feet Assistive device: Rolling walker (2 wheeled) Gait Pattern/deviations: Step-to pattern;Decreased stance time - left;Shuffle;Trunk flexed Gait velocity: decreased Gait velocity interpretation: Below normal speed for age/gender General Gait Details: Willing to take only several steps towards chair. VC for upright posture and min assist for walker control. Cues for technique. No buckling noted. Complains of fatigue.   Stairs            Wheelchair Mobility    Modified Rankin (Stroke Patients Only) Modified Rankin (Stroke Patients Only) Pre-Morbid Rankin Score: Moderate disability Modified Rankin: Moderately severe disability     Balance                                    Cognition Arousal/Alertness: Awake/alert Behavior During Therapy: WFL for tasks assessed/performed Overall Cognitive Status: Impaired/Different from baseline Area of Impairment: Safety/judgement;Problem solving         Safety/Judgement: Decreased awareness of safety;Decreased awareness of deficits   Problem Solving: Slow processing;Difficulty sequencing;Requires verbal cues      Exercises General Exercises - Lower Extremity Ankle Circles/Pumps: AROM;Both;20 reps;Supine Quad Sets: Strengthening;Both;10 reps;Supine Long Arc Quad: Strengthening;Both;5 reps;Seated Long Arc Quad Limitations: Lt limited 2/2 weakness Hip Flexion/Marching: Strengthening;Both;10 reps;Supine    General Comments  Pertinent Vitals/Pain Pain Assessment: Faces Faces Pain Scale: Hurts a little bit Pain Location:  LLE Pain Descriptors / Indicators: Aching Pain Intervention(s): Monitored during session;Repositioned    Home Living                      Prior Function            PT Goals (current goals can now be found in the care plan section) Acute Rehab PT Goals Patient Stated Goal: Be able to walk PT Goal Formulation: With patient/family Time For Goal Achievement: 03/13/15 Potential to Achieve Goals: Good Progress towards PT goals: Progressing toward goals    Frequency  Min 3X/week    PT Plan Current plan remains appropriate    Co-evaluation PT/OT/SLP Co-Evaluation/Treatment: Yes Reason for Co-Treatment: Complexity of the patient's impairments (multi-system involvement);For patient/therapist safety PT goals addressed during session: Mobility/safety with mobility;Balance;Proper use of DME;Strengthening/ROM       End of Session   Activity Tolerance: Patient limited by fatigue (Unwilling to ambulate further distance) Patient left: with call bell/phone within reach;in chair;with chair alarm set;with family/visitor present     Time: [id number] PT Time Calculation (min) (ACUTE ONLY): 28 min  Charges:  $Therapeutic Activity: 8-22 mins                    G Codes:      Ellouise Newer 03-14-2015, 12:13 PM Camille Bal Becker, [place]

## 2015-02-27 NOTE — Progress Notes (Signed)
ANTICOAGULATION CONSULT NOTE - Follow Up Consult  Pharmacy Consult for Coumadin Indication: stroke and hypercoaguable state  Allergies  Allergen Reactions  . Food Swelling    Red peppers    Patient Measurements: Height: 5\' 3"  (160 cm) Weight: 184 lb 4.9 oz (83.6 kg) (bed scale) IBW/kg (Calculated) : 52.4  Vital Signs: Temp: 97.4 F (36.3 C) (11/30 1006) Temp Source: Oral (11/30 1006) BP: 141/79 mmHg (11/30 1006) Pulse Rate: 73 (11/30 1006)  Labs:  Recent Labs  02/24/15 2247  02/25/15 0435 02/26/15 0438 02/27/15 0618  HGB  --   < > 8.4* 8.8* 8.5*  HCT  --   --  27.5* 29.6* 27.9*  PLT  --   --  131* 140* 110*  LABPROT  --   --  25.2* 23.9* 25.9*  INR  --   --  2.31* 2.16* 2.40*  HEPARINUNFRC 0.41  --  0.43  --   --   CREATININE  --   --   --  6.98*  --   < > = values in this interval not displayed.  Estimated Creatinine Clearance: 11.1 mL/min (by C-G formula based on Cr of 6.98).  Assessment:  39 yr old female with ESRD on Coumadin for history of SLE, stroke, and hypercoagulable state. She had been on Coumadin 5 mg daily from 11/16-11/22/16, with gradual rise to therapeutic INR after 5 days. Coumadin held/reversed on 11/23, with possible need for lumbar puncture or other procedure.    Started on IV heparin on 11/25 when INR ~2.  INR trended back up to 2.31 on 11/28, without any Coumadin doses.  Heparin stopped and Coumadin resumed with 4 mg on 11/28, as no procedures planned.    INR remains therapeutic (2.40) after 5 mg dose on 11/29.  Previously up to 3.34 while on Coumadin 5 mg daily.  Albumin down to 1.5.  Little PO intake.  Platelet count trended down to 110K, no bleeding reported.    Goal of Therapy:  INR 2-3 Monitor platelets by anticoagulation protocol: Yes   Plan:   Coumadin 2.5 mg x 1 today.  Continue daily PT/INR.  Arty Baumgartner, [place] Pager: [phone] 02/27/2015,4:03 PM

## 2015-02-27 NOTE — Progress Notes (Signed)
Occupational Therapy Treatment Patient Details Name: [name] MRN: [id number] DOB: Jun 09, 1975 Today's Date: 02/27/2015    History of present illness Pt is a 39 y.o. F from [facility] who has had several recent hospitalizations.  She had a Rt MCA infarction and developed pseudomonal bacteremia from Rt femoral HD line which was treated.  Was doing well in CIR until she acutely developed severe confusion and hallucinations following HD sessions, likely a result of sepsis from Lt ant thigh abscess. Pt's additional PMH includes lupus, fibromyalgia, anemia.   OT comments  Pt with bowl inconsistence at beginning of session and break down noted at sacrum. Pt positioned in chair with family present. Pt smiling and thanking therapist for coming to participate this session.  Follow Up Recommendations  CIR    Equipment Recommendations  Tub/shower bench;Wheelchair (measurements OT);Hospital bed    Recommendations for Other Services Rehab consult    Precautions / Restrictions Precautions Precautions: Fall Precaution Comments: pt with high anxiety Restrictions Weight Bearing Restrictions: No       Mobility Bed Mobility Overal bed mobility: Needs Assistance Bed Mobility: Rolling Rolling: Min assist   Supine to sit: Mod assist;+2 for physical assistance     General bed mobility comments: Min assist for rolling to Lt and right using UEs to pull through PTs hand and cues to use rail as able.  Mod assist +2 for truncal support to rise to EOB. VC for sequencing of LEs out of bed with some assist for LLE. Pt able to pull through PT's hands with BIL UEs.  Transfers Overall transfer level: Needs assistance Equipment used: Rolling walker (2 wheeled) Transfers: Sit to/from Stand Sit to Stand: Min assist;+2 safety/equipment         General transfer comment: Physical assist for boost to stand. Required multiple cues for hand placement and initiation. No buckling once upright but using back of  knees on bed for stability with some Rt leaning until cued for correction. pt anxious with change of position and needed constant cues to RW    Balance                                   ADL Overall ADL's : Needs assistance/impaired Eating/Feeding: Set up;Sitting Eating/Feeding Details (indicate cue type and reason): drinking from cup         Lower Body Bathing: Total assistance;+2 for physical assistance;Bed level Lower Body Bathing Details (indicate cue type and reason): incontinent of bowel and unaware. required (A) to log roll R and L to (A)  Upper Body Dressing : Moderate assistance;Bed level         Toilet Transfer Details (indicate cue type and reason): simulated min (A)                  Vision                     Perception     Praxis      Cognition   Behavior During Therapy: WFL for tasks assessed/performed Overall Cognitive Status: Impaired/Different from baseline Area of Impairment: Safety/judgement;Problem solving   Current Attention Level: Sustained    Following Commands: Follows one step commands with increased time Safety/Judgement: Decreased awareness of safety;Decreased awareness of deficits   Problem Solving: Slow processing General Comments: Pt very concerned with having female therapist present during session. pt becomes anxious when therapist starts to leave to gather linens. Pt calmed  with more detailed description for return and very visually relieved with therapist return    Extremity/Trunk Assessment               Exercises General Exercises - Lower Extremity Ankle Circles/Pumps: AROM;Both;20 reps;Supine Quad Sets: Strengthening;Both;10 reps;Supine Long Arc Quad: Strengthening;Both;5 reps;Seated Long Arc Quad Limitations: Lt limited 2/2 weakness Hip Flexion/Marching: Strengthening;Both;10 reps;Supine   Shoulder Instructions       General Comments      Pertinent Vitals/ Pain       Pain Assessment:  Faces Faces Pain Scale: Hurts a little bit Pain Location: LLE Pain Descriptors / Indicators: Aching Pain Intervention(s): Monitored during session;Repositioned  Home Living                                          Prior Functioning/Environment Level of Independence: Needs assistance  Gait / Transfers Assistance Needed: Per CIR notes pt was at supervision level using RW, ambulating 50 ft.   ADL's / Homemaking Assistance Needed: Per CIR notes, pt was performing ADL with min assist and AE and mobility with supervision.       Frequency Min 2X/week     Progress Toward Goals  OT Goals(current goals can now be found in the care plan section)  Progress towards OT goals: Progressing toward goals  Acute Rehab OT Goals Patient Stated Goal: to visit with family OT Goal Formulation: With patient Time For Goal Achievement: 03/06/15 Potential to Achieve Goals: Good ADL Goals Pt Will Perform Eating: Independently;sitting Pt Will Perform Upper Body Bathing: with supervision;sitting Pt Will Perform Lower Body Bathing: with min assist;with adaptive equipment;sit to/from stand Pt Will Perform Upper Body Dressing: with supervision;sitting Pt Will Perform Lower Body Dressing: with min assist;with adaptive equipment;sit to/from stand Pt Will Transfer to Toilet: with supervision;ambulating Pt Will Perform Toileting - Clothing Manipulation and hygiene: with supervision;sit to/from stand Pt/caregiver will Perform Home Exercise Program: Increased strength;Right Upper extremity;Left upper extremity;With written HEP provided Additional ADL Goal #1: Pt will identify items necessary for ADL.  Plan Discharge plan remains appropriate    Co-evaluation    PT/OT/SLP Co-Evaluation/Treatment: Yes Reason for Co-Treatment: Complexity of the patient's impairments (multi-system involvement) PT goals addressed during session: Mobility/safety with mobility;Balance;Proper use of  DME;Strengthening/ROM OT goals addressed during session: ADL's and self-care      End of Session Equipment Utilized During Treatment: Rolling walker   Activity Tolerance Patient tolerated treatment well   Patient Left in chair;with call bell/phone within reach;with chair alarm set;with family/visitor present   Nurse Communication Mobility status;Precautions        Time: 1124-1150 OT Time Calculation (min): 26 min  Charges: OT General Charges $OT Visit: 1 Procedure OT Treatments $Self Care/Home Management : 8-22 mins  Parke Poisson B 02/27/2015, 1:05 PM   Jeri Modena   OTR/L Pager: [phone] Office: [phone] .

## 2015-02-27 NOTE — Progress Notes (Signed)
Occupational Therapy Treatment Patient Details Name: [name] MRN: [id number] DOB: February 02, 1976 Today's Date: 02/27/2015    History of present illness Pt is a 39 y.o. F from CIR who has had several recent hospitalizations.  She had a Rt MCA infarction and developed pseudomonal bacteremia from Rt femoral HD line which was treated.  Was doing well in CIR until she acutely developed severe confusion and hallucinations following HD sessions, likely a result of sepsis from Lt ant thigh abscess. Pt's additional PMH includes lupus, fibromyalgia, anemia.   OT comments  OT returning to room due to RN / tech needing (A) to help patient back to bed from chair. Pt reports "i can't" when offered assistance. Stedy removed from patients space and hand held (A) offered instead and pt completed sit<>Stand and ambulated to EOB. Pt supine with RN/ tech in room.    Follow Up Recommendations  CIR    Equipment Recommendations  Tub/shower bench;Wheelchair (measurements OT);Hospital bed    Recommendations for Other Services Rehab consult    Precautions / Restrictions Precautions Precautions: Fall Precaution Comments: pt with high anxiety Restrictions Weight Bearing Restrictions: No       Mobility Bed Mobility Overal bed mobility: Needs Assistance Bed Mobility: Sit to Supine Rolling: Min assist   Supine to sit: Mod assist;+2 for physical assistance Sit to supine: Mod assist   General bed mobility comments: pt requires (A) with BIL LE to return supine  Transfers Overall transfer level: Needs assistance Equipment used: 2 person hand held assist Transfers: Sit to/from Stand Sit to Stand: +2 physical assistance;Mod assist         General transfer comment: pt needed max encouragement. pt with incr effort with sister present to (A). Pt less anxious with sister present    Balance                                   ADL Overall ADL's : Needs assistance/impaired Eating/Feeding:  Set up;Sitting Eating/Feeding Details (indicate cue type and reason): drinking from cup         Lower Body Bathing: Total assistance;+2 for physical assistance;Bed level Lower Body Bathing Details (indicate cue type and reason): incontinent of bowel and unaware. required (A) to log roll R and L to (A)  Upper Body Dressing : Moderate assistance;Bed level         Toilet Transfer Details (indicate cue type and reason): simulated min (A)                  Vision                     Perception     Praxis      Cognition   Behavior During Therapy: WFL for tasks assessed/performed Overall Cognitive Status: Impaired/Different from baseline Area of Impairment: Safety/judgement;Problem solving   Current Attention Level: Sustained    Following Commands: Follows one step commands with increased time Safety/Judgement: Decreased awareness of safety;Decreased awareness of deficits   Problem Solving: Slow processing General Comments: pt states "who are you?" no recall of therapist previous session. Pt anxious with mobility and stating "i can't" Sister present and states "she helped you before. you can do it"    Extremity/Trunk Assessment               Exercises General Exercises - Lower Extremity Ankle Circles/Pumps: AROM;Both;20 reps;Supine Quad Sets: Strengthening;Both;10 reps;Supine Long Arc Quad: Strengthening;Both;5 reps;Seated  Long Arc Quad Limitations: Lt limited 2/2 weakness Hip Flexion/Marching: Strengthening;Both;10 reps;Supine   Shoulder Instructions       General Comments      Pertinent Vitals/ Pain       Pain Assessment: Faces Faces Pain Scale: Hurts a little bit Pain Location: LLE Pain Descriptors / Indicators: Aching Pain Intervention(s): Monitored during session;Repositioned  Home Living                                          Prior Functioning/Environment Level of Independence: Needs assistance  Gait / Transfers  Assistance Needed: Per CIR notes pt was at supervision level using RW, ambulating 50 ft.   ADL's / Homemaking Assistance Needed: Per CIR notes, pt was performing ADL with min assist and AE and mobility with supervision.       Frequency Min 2X/week     Progress Toward Goals  OT Goals(current goals can now be found in the care plan section)  Progress towards OT goals: Progressing toward goals  Acute Rehab OT Goals Patient Stated Goal: to visit with family OT Goal Formulation: With patient Time For Goal Achievement: 03/06/15 Potential to Achieve Goals: Good ADL Goals Pt Will Perform Eating: Independently;sitting Pt Will Perform Upper Body Bathing: with supervision;sitting Pt Will Perform Lower Body Bathing: with min assist;with adaptive equipment;sit to/from stand Pt Will Perform Upper Body Dressing: with supervision;sitting Pt Will Perform Lower Body Dressing: with min assist;with adaptive equipment;sit to/from stand Pt Will Transfer to Toilet: with supervision;ambulating Pt Will Perform Toileting - Clothing Manipulation and hygiene: with supervision;sit to/from stand Pt/caregiver will Perform Home Exercise Program: Increased strength;Right Upper extremity;Left upper extremity;With written HEP provided Additional ADL Goal #1: Pt will identify items necessary for ADL.  Plan Discharge plan remains appropriate    Co-evaluation    PT/OT/SLP Co-Evaluation/Treatment: Yes Reason for Co-Treatment: Complexity of the patient's impairments (multi-system involvement) PT goals addressed during session: Mobility/safety with mobility;Balance;Proper use of DME;Strengthening/ROM OT goals addressed during session: ADL's and self-care      End of Session Equipment Utilized During Treatment: Gait belt   Activity Tolerance Patient tolerated treatment well   Patient Left in bed;with call bell/phone within reach;with nursing/sitter in room;with family/visitor present   Nurse Communication  Mobility status;Precautions        Time: 1230-1240 OT Time Calculation (min): 10 min  Charges: OT General Charges $OT Visit: 1 Procedure OT Treatments $Self Care/Home Management : 8-22 mins  Parke Poisson B 02/27/2015, 1:11 PM  Jeri Modena   OTR/L Pager: [phone] Office: [phone] .

## 2015-02-27 NOTE — Progress Notes (Signed)
Initial Nutrition Assessment  DOCUMENTATION CODES:   Obesity unspecified  INTERVENTION:  Discontinue Ensure.  Provide Boost Breeze po TID, each supplement provides 250 kcal and 9 grams of protein.  Provide 30 ml Prostat po BID, each supplement provides 100 kcal and 15 grams of protein.   Encourage adequate PO intake.   NUTRITION DIAGNOSIS:   Increased nutrient needs related to chronic illness as evidenced by estimated needs.  GOAL:   Patient will meet greater than or equal to 90% of their needs  MONITOR:   PO intake, Supplement acceptance, Weight trends, Labs, I & O's  REASON FOR ASSESSMENT:   Malnutrition Screening Tool    ASSESSMENT:   39 y.o. 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 Per the rehab physician and nursing staff, the patient started stating that there was someone else in the room telling her what to do. This has been going on for the past 24 hours or so and seems to be getting worse. This is also accompanied by tachycardia and low grade fever.   Pt was unavailable during time of visit. RD unable to obtain most recent nutrition history. Pt is however familiar to this RD due to previous admission. Meal completion currently has been poor at 0-10%. Pt currently has Ensure ordered, however has been refusing them. RD to order Boost Breeze and Prostat instead. Per Epic weigh records, pt with a 8% weight loss in 23 days. RD to monitor closely.   Unable to complete Nutrition-Focused physical exam at this time.   Labs and medications reviewed.   Diet Order:  Diet renal with fluid restriction Fluid restriction:: 1200 mL Fluid; Room service appropriate?: Yes; Fluid consistency:: Thin  Skin:   (Incision on thigh)  Last BM:  11/29  Height:   Ht Readings from Last 1 Encounters:  02/22/15 5\' 3"  (1.6 m)    Weight:   Wt Readings from Last 1  Encounters:  02/26/15 184 lb 4.9 oz (83.6 kg)    Ideal Body Weight:  52.27 kg  BMI:  Body mass index is 32.66 kg/(m^2).  Estimated Nutritional Needs:   Kcal:  [id number]  Protein:  105-120 grams  Fluid:  Per MD  EDUCATION NEEDS:   No education needs identified at this time  Corrin Parker, MS, RD, LDN Pager # [phone] After hours/ weekend pager # [phone]

## 2015-02-27 NOTE — Progress Notes (Signed)
  [facility] Progress Note   Subjective: alert, still having hallucinations/ paranoia  Filed Vitals:   02/26/15 1321 02/26/15 1432 02/26/15 2038 02/27/15 1006  BP: 129/79 104/73 122/69 141/79  Pulse: 85 93 110 73  Temp: 98.5 F (36.9 C) 97.8 F (36.6 C) 97.3 F (36.3 C) 97.4 F (36.3 C)  TempSrc: Oral Oral Oral Oral  Resp: 22 21 19 18   Height:      Weight: 83.6 kg (184 lb 4.9 oz)     SpO2: 100%  98% 98%   . ampicillin-sulbactam (UNASYN) IV  3 g Intravenous Q24H  . [START ON 03/02/2015] Darbepoetin Alfa  200 mcg Intravenous Q Sat-HD  . feeding supplement (ENSURE ENLIVE)  237 mL Oral BID BM  . hydroxychloroquine  400 mg Oral Daily  . metoprolol succinate  25 mg Oral Daily  . midodrine  10 mg Oral BID WC  . pantoprazole  40 mg Oral Daily  . sodium chloride  3 mL Intravenous Q12H  . Warfarin - Pharmacist Dosing Inpatient   Does not apply q1800     acetaminophen, acetaminophen, HYDROmorphone (DILAUDID) injection, Influenza vac split quadrivalent PF, ondansetron **OR** ondansetron (ZOFRAN) IV, pneumococcal 23 valent vaccine, polyethylene glycol, QUEtiapine Exam: WD [name] in NAD No jvd Chest clear bilat RRR no mrg Abd obese ntnd Ext 2+ L thigh edema, no fluctuance or drainage AVG L thigh +bruit Neuro gen weak, ox 3  [facility] TTS 4 hr   87.5kg 2/2 bath L thigh AVG Hep 2600 Aranesp 200/week      Assessment: 1 AMS continues w hallucination 2 Acinetobacter UTI on Unasyn 2 ESRD TTS HD using thigh graft 3 SLE no meds currently 4 SP CVA/ endocarditis (marantic) 5 Debil 6 Hx SVT on MTP 7 Hypotens on midodrine 8 Anemia max darbe 9 Vol under dry wt, no excess  Plan - HD thursday   Kelly Splinter MD [facility] pager [phone]    cell [phone] 02/27/2015, 11:47 AM    Recent Labs Lab 02/23/15 1155 02/24/15 0417 02/26/15 0438  NA 137 137 138  K 3.5 3.8 4.0  CL 102 99* 99*  CO2 26 29 29   GLUCOSE 96 72 76  BUN 16 16 36*  CREATININE  4.55* 4.92* 6.98*  CALCIUM 8.3* 8.4* 8.3*  PHOS 2.8 4.2 4.1    Recent Labs Lab 02/23/15 1155 02/24/15 0417 02/26/15 0438  ALBUMIN 1.6* 1.8* 1.5*    Recent Labs Lab 02/25/15 0435 02/26/15 0438 02/27/15 0618  WBC 9.1 7.7 8.8  HGB 8.4* 8.8* 8.5*  HCT 27.5* 29.6* 27.9*  MCV 95.2 93.4 93.3  PLT 131* 140* 110*

## 2015-02-27 NOTE — Progress Notes (Signed)
Patient ID: [name], female   DOB: December 28, 1975, 39 y.o.   MRN: [id number] Patient was seen today regarding her left femoral loop AV Gore-Tex graft. She has been having successful dialysis via her femoral graft and has had her hemodialysis catheter removed. She complains today of calf tenderness.  She underwent repeat duplex yesterday which showed no DVT. This did show some fluid around the graft which is not unexpected 6 weeks out from surgery.  On physical exam her graft has a good thrill present. Her groin incision is healed. She does have a large pannus with some of which are in her groin. Distal tunneling incisions are also well-healed. There is no fluctuance or erythema or tenderness throughout her graft.  No evidence of graft infection. Will not follow actively. Please call if we can assist.

## 2015-02-27 NOTE — Progress Notes (Signed)
Subjective: No acute events overnight. Very tearful and upset about her prolonged hospitalization and feels that visitors and staff do not believe her or listen to her. She does seem to still have some auditory hallucinations but less prominent compared to yesterday.  Objective: Vital signs in last 24 hours: Filed Vitals:   02/26/15 1321 02/26/15 1432 02/26/15 2038 02/27/15 1006  BP: 129/79 104/73 122/69 141/79  Pulse: 85 93 110 73  Temp: 98.5 F (36.9 C) 97.8 F (36.6 C) 97.3 F (36.3 C) 97.4 F (36.3 C)  TempSrc: Oral Oral Oral Oral  Resp: 22 21 19 18   Height:      Weight: 83.6 kg (184 lb 4.9 oz)     SpO2: 100%  98% 98%   Weight change:   Intake/Output Summary (Last 24 hours) at 02/27/15 1403 Last data filed at 02/27/15 1300  Gross per 24 hour  Intake    100 ml  Output      1 ml  Net     99 ml   GENERAL- obese, ill appearing, alert, co-operative HEENT- oral mucosa appears moist CARDIAC- RRR, no murmurs, rubs or gallops. RESP- CTAB, no crackles or wheezes ABDOMEN- Mild suprapubic tenderness to palpation NEURO- Left toes tender to light manipulation, flexes but does not extend left leg on command EXTREMITIES- 3+ edema of the left thigh around graft site, 1+ distal LLE edema, left knee, calf, and foot tender to palpation PSYCH- Tearful, requiring reassurance, denies active hallucinations but reacts to internal stimuli as auditory  Lab Results: Basic Metabolic Panel:  Recent Labs Lab 02/24/15 0417 02/26/15 0438  NA 137 138  K 3.8 4.0  CL 99* 99*  CO2 29 29  GLUCOSE 72 76  BUN 16 36*  CREATININE 4.92* 6.98*  CALCIUM 8.4* 8.3*  PHOS 4.2 4.1   Liver Function Tests:  Recent Labs Lab 02/24/15 0417 02/26/15 0438  ALBUMIN 1.8* 1.5*  CBC:  Recent Labs Lab 02/26/15 0438 02/27/15 0618  WBC 7.7 8.8  HGB 8.8* 8.5*  HCT 29.6* 27.9*  MCV 93.4 93.3  PLT 140* 110*   CBG:  Recent Labs Lab 02/21/15 2211  GLUCAP 125*   Thyroid Function Tests: No  results for input(s): TSH, T4TOTAL, FREET4, T3FREE, THYROIDAB in the last 168 hours. Coagulation:  Recent Labs Lab 02/24/15 0417 02/25/15 0435 02/26/15 0438 02/27/15 0618  LABPROT 21.4* 25.2* 23.9* 25.9*  INR 1.87* 2.31* 2.16* 2.40*    Micro Results: Recent Results (from the past 240 hour(s))  Culture, blood (routine x 2)     Status: None   Collection Time: 02/19/15  9:50 AM  Result Value Ref Range Status   Specimen Description BLOOD RIGHT ANTECUBITAL  Final   Special Requests BOTTLES DRAWN AEROBIC AND ANAEROBIC 5CCS  Final   Culture NO GROWTH 5 DAYS  Final   Report Status 02/24/2015 FINAL  Final  Culture, blood (routine x 2)     Status: None   Collection Time: 02/19/15 10:00 AM  Result Value Ref Range Status   Specimen Description BLOOD RIGHT HAND  Final   Special Requests BOTTLES DRAWN AEROBIC AND ANAEROBIC 5CCS  Final   Culture NO GROWTH 5 DAYS  Final   Report Status 02/24/2015 FINAL  Final  Urine culture     Status: None   Collection Time: 02/20/15 12:35 PM  Result Value Ref Range Status   Specimen Description URINE, CATHETERIZED  Final   Special Requests NONE  Final   Culture   Final    >=  100,000 COLONIES/mL VANCOMYCIN RESISTANT ENTEROCOCCUS >=100,000 COLONIES/mL ACINETOBACTER CALCOACETICUS/BAUMANNII COMPLEX    Report Status 02/24/2015 FINAL  Final   Organism ID, Bacteria ACINETOBACTER CALCOACETICUS/BAUMANNII COMPLEX  Final   Organism ID, Bacteria VANCOMYCIN RESISTANT ENTEROCOCCUS  Final      Susceptibility   Acinetobacter calcoaceticus/baumannii complex - MIC*    CEFTAZIDIME >=64 RESISTANT Resistant     CEFTRIAXONE >=64 RESISTANT Resistant     CIPROFLOXACIN >=4 RESISTANT Resistant     GENTAMICIN 2 SENSITIVE Sensitive     IMIPENEM 2 SENSITIVE Sensitive     PIP/TAZO 32 INTERMEDIATE Intermediate     TRIMETH/SULFA >=320 RESISTANT Resistant     AMPICILLIN/SULBACTAM 4 SENSITIVE Sensitive     * >=100,000 COLONIES/mL ACINETOBACTER CALCOACETICUS/BAUMANNII COMPLEX    Vancomycin resistant enterococcus - MIC*    AMPICILLIN <=2 SENSITIVE Sensitive     LEVOFLOXACIN >=8 RESISTANT Resistant     NITROFURANTOIN <=16 SENSITIVE Sensitive     VANCOMYCIN >=32 RESISTANT Resistant     LINEZOLID 2 SENSITIVE Sensitive     * >=100,000 COLONIES/mL VANCOMYCIN RESISTANT ENTEROCOCCUS  Culture, blood (routine x 2)     Status: None   Collection Time: 02/20/15  3:30 PM  Result Value Ref Range Status   Specimen Description BLOOD VENOUS  Final   Special Requests BOTTLES DRAWN AEROBIC AND ANAEROBIC 10ML  Final   Culture NO GROWTH 5 DAYS  Final   Report Status 02/25/2015 FINAL  Final  Culture, blood (routine x 2)     Status: None   Collection Time: 02/20/15  4:16 PM  Result Value Ref Range Status   Specimen Description BLOOD ARTERIAL LINE  Final   Special Requests BOTTLES DRAWN AEROBIC AND ANAEROBIC 10ML  Final   Culture NO GROWTH 5 DAYS  Final   Report Status 02/25/2015 FINAL  Final  Culture, blood (routine x 2)     Status: None   Collection Time: 02/21/15  8:36 PM  Result Value Ref Range Status   Specimen Description BLOOD RIGHT HAND  Final   Special Requests IN PEDIATRIC BOTTLE 3CC  Final   Culture NO GROWTH 5 DAYS  Final   Report Status 02/26/2015 FINAL  Final  Culture, blood (routine x 2)     Status: None   Collection Time: 02/21/15  9:21 PM  Result Value Ref Range Status   Specimen Description BLOOD RIGHT THUMB  Final   Special Requests IN PEDIATRIC BOTTLE 1.5CC  Final   Culture NO GROWTH 5 DAYS  Final   Report Status 02/26/2015 FINAL  Final   Studies/Results: No results found. Medications: I have reviewed the patient's current medications. Scheduled Meds: . ampicillin-sulbactam (UNASYN) IV  3 g Intravenous Q24H  . [START ON 03/02/2015] Darbepoetin Alfa  200 mcg Intravenous Q Sat-HD  . feeding supplement (ENSURE ENLIVE)  237 mL Oral BID BM  . hydroxychloroquine  400 mg Oral Daily  . metoprolol succinate  25 mg Oral Daily  . midodrine  10 mg Oral BID WC  .  pantoprazole  40 mg Oral Daily  . sodium chloride  3 mL Intravenous Q12H  . Warfarin - Pharmacist Dosing Inpatient   Does not apply q1800   Continuous Infusions:   PRN Meds:.acetaminophen, acetaminophen, HYDROmorphone (DILAUDID) injection, Influenza vac split quadrivalent PF, ondansetron **OR** ondansetron (ZOFRAN) IV, pneumococcal 23 valent vaccine, polyethylene glycol, QUEtiapine Assessment/Plan: Acute delirium 2/2 UTI: Unclear why symptoms worsened again over past 2 days with more delusion and hallucination. She remains afebrile with stable vital signs. Will await final read on  L thigh [place] to see if any change or progression. 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 She is eager to "prove herself" on PT/OT and make progress towards getting home. -qAM CBC, renal labs -Continue Unasyn 11/25>> -Tylenol prn for fever -Dilaudid 1 mg IV Q4H PRN pain -Quetiapine 25mg  BID PRN for agitation -Repeat I&O cath for Urine Cx -Repeat blood cultures  Left leg edema: Negative for clot in LLE on initial read. Thigh fluid collection redemonstrated. Unclear if her pain is related simply to worsened edema of the leg. It is neurovascularly intact on exam and the strength seems more limited by pain and her residual deficit from stroke -Obtain US for LEDVT -Continuing abtx as above  ESRD on HD:  Back on her outpatient TuThSa schedule. -Midodrine 10mg  for hypotension -Nephrology following, appreciate recs -HD per nephrology  H/o recent R. MCA Embolic CVA: Needs ongoing PT/OT work, and is likely a candidate for CIR again due to loss of her previous progress with this medical admission and infection -Daily PT/INR -Coumadin -Still needs daily PT/OT treatment  Depression: Very tearful, with flat affect and depressed mood. We will confirm EKG and plan to restart SSRI treatment. She was started on Celexa 10mg  at [facility] for about  1 week without adverse effect.  SLE: Continue Plaquenil H/O SVT/Tachycardia: Continue Toprol-XL 25mg   Diet: Renal DVT ppx: Coumadin FULL CODE  Dispo: Disposition is deferred at this time, awaiting improvement of current medical problems. Anticipated discharge will depend on treatment plan after further evaluation, approximately 1-3 days.  The patient does have a current PCP Lin Landsman, MD) and does need an [facility] hospital follow-up appointment after discharge.  The patient does not have transportation limitations that hinder transportation to clinic appointments.   LOS: 8 days   Collier Salina, MD 02/27/2015, 2:03 PM

## 2015-02-28 DIAGNOSIS — L8990 Pressure ulcer of unspecified site, unspecified stage: Secondary | ICD-10-CM | POA: Insufficient documentation

## 2015-02-28 LAB — RENAL FUNCTION PANEL
ALBUMIN: 1.4 g/dL — AB (ref 3.5–5.0)
ANION GAP: 11 (ref 5–15)
BUN: 28 mg/dL — AB (ref 6–20)
CALCIUM: 8.4 mg/dL — AB (ref 8.9–10.3)
CO2: 30 mmol/L (ref 22–32)
CREATININE: 5.69 mg/dL — AB (ref 0.44–1.00)
Chloride: 98 mmol/L — ABNORMAL LOW (ref 101–111)
GFR calc Af Amer: 10 mL/min — ABNORMAL LOW (ref >60)
GFR calc non Af Amer: 9 mL/min — ABNORMAL LOW (ref >60)
GLUCOSE: 77 mg/dL (ref 65–99)
PHOSPHORUS: 4.6 mg/dL (ref 2.5–4.6)
Potassium: 4.4 mmol/L (ref 3.5–5.1)
SODIUM: 139 mmol/L (ref 135–145)

## 2015-02-28 LAB — PROTIME-INR
INR: 2.9 — AB (ref 0.00–1.49)
PROTHROMBIN TIME: 29.8 s — AB (ref 11.6–15.2)

## 2015-02-28 LAB — CBC
HCT: 28.5 % — ABNORMAL LOW (ref 36.0–46.0)
HEMOGLOBIN: 8.4 g/dL — AB (ref 12.0–15.0)
MCH: 28 pg (ref 26.0–34.0)
MCHC: 29.5 g/dL — AB (ref 30.0–36.0)
MCV: 95 fL (ref 78.0–100.0)
PLATELETS: 97 10*3/uL — AB (ref 150–400)
RBC: 3 MIL/uL — ABNORMAL LOW (ref 3.87–5.11)
RDW: 16.9 % — AB (ref 11.5–15.5)
WBC: 8.4 10*3/uL (ref 4.0–10.5)

## 2015-02-28 MED ORDER — LIDOCAINE HCL (PF) 1 % IJ SOLN: 5.0000 mL | INTRAMUSCULAR | Status: DC | PRN

## 2015-02-28 MED ORDER — ONDANSETRON HCL 4 MG/2ML IJ SOLN
INTRAMUSCULAR | Status: AC
  Filled 2015-02-28: qty 2

## 2015-02-28 MED ORDER — HEPARIN SODIUM (PORCINE) 1000 UNIT/ML DIALYSIS: 1000.0000 [IU] | INTRAMUSCULAR | Status: DC | PRN

## 2015-02-28 MED ORDER — DARBEPOETIN ALFA 200 MCG/0.4ML IJ SOSY
PREFILLED_SYRINGE | INTRAMUSCULAR | Status: AC
  Filled 2015-02-28: qty 0.4

## 2015-02-28 MED ORDER — LIDOCAINE-PRILOCAINE 2.5-2.5 % EX CREA: 1.0000 "application " | TOPICAL_CREAM | CUTANEOUS | Status: DC | PRN

## 2015-02-28 MED ORDER — SODIUM CHLORIDE 0.9 % IV SOLN: 100.0000 mL | INTRAVENOUS | Status: DC | PRN

## 2015-02-28 MED ORDER — WARFARIN SODIUM 1 MG PO TABS
1.0000 mg | ORAL_TABLET | Freq: Once | ORAL | Status: AC
Start: 2015-02-28 — End: 2015-02-28
  Administered 2015-02-28: 1 mg via ORAL
  Filled 2015-02-28: qty 1

## 2015-02-28 MED ORDER — DARBEPOETIN ALFA 200 MCG/0.4ML IJ SOSY
200.0000 ug | PREFILLED_SYRINGE | INTRAMUSCULAR | Status: DC
  Administered 2015-02-28 – 2015-03-21 (×4): 200 ug via INTRAVENOUS
  Filled 2015-02-28 (×3): qty 0.4

## 2015-02-28 MED ORDER — ALTEPLASE 2 MG IJ SOLR: 2.0000 mg | Freq: Once | INTRAMUSCULAR | Status: DC | PRN

## 2015-02-28 MED ORDER — MIDODRINE HCL 5 MG PO TABS
ORAL_TABLET | ORAL | Status: AC
  Filled 2015-02-28: qty 2

## 2015-02-28 MED ORDER — QUETIAPINE FUMARATE 25 MG PO TABS
25.0000 mg | ORAL_TABLET | Freq: Every day | ORAL | Status: DC
  Administered 2015-02-28: 25 mg via ORAL
  Filled 2015-02-28: qty 1

## 2015-02-28 MED ORDER — HEPARIN SODIUM (PORCINE) 1000 UNIT/ML DIALYSIS
2600.0000 [IU] | Freq: Once | INTRAMUSCULAR | Status: AC
  Administered 2015-02-28: 2600 [IU] via INTRAVENOUS_CENTRAL

## 2015-02-28 MED ORDER — PENTAFLUOROPROP-TETRAFLUOROETH EX AERO: 1.0000 "application " | INHALATION_SPRAY | CUTANEOUS | Status: DC | PRN

## 2015-02-28 NOTE — Plan of Care (Signed)
Problem: Skin Integrity: Goal: Risk for impaired skin integrity will decrease Outcome: Not Progressing Open areas noted on buttocks/sacrum.

## 2015-02-28 NOTE — Progress Notes (Signed)
ANTICOAGULATION CONSULT NOTE - Follow Up Consult  Pharmacy Consult for Coumadin and Unasyn Indication: stroke and hypercoaguable state;  UTI   Allergies  Allergen Reactions  . Food Swelling    Red peppers    Patient Measurements: Height: 5\' 3"  (160 cm) Weight: 177 lb 14.6 oz (80.7 kg) IBW/kg (Calculated) : 52.4  Vital Signs: Temp: 98.4 F (36.9 C) (12/01 1145) BP: 118/68 mmHg (12/01 1145) Pulse Rate: 96 (12/01 1145)  Labs:  Recent Labs  02/26/15 0438 02/27/15 0618 02/28/15 0619  HGB 8.8* 8.5* 8.4*  HCT 29.6* 27.9* 28.5*  PLT 140* 110* 97*  LABPROT 23.9* 25.9* 29.8*  INR 2.16* 2.40* 2.90*  CREATININE 6.98*  --  5.69*    Estimated Creatinine Clearance: 13.3 mL/min (by C-G formula based on Cr of 5.69).  Assessment:  39 yr old female with ESRD on Coumadin for history of SLE, stroke, and hypercoagulable state. She had been on Coumadin 5 mg daily from 11/16-11/22/16, with gradual rise to therapeutic INR after 5 days. Coumadin held/reversed on 11/23, with possible need for lumbar puncture or other procedure.    Started on IV heparin on 11/25 when INR ~2.  INR trended back up to 2.31 on 11/28, without any Coumadin doses.  Heparin stopped and Coumadin resumed on 11/28, as no procedures planned.     INR remains therapeutic (2.90) but has trended up after 4 mg, 5 mg then 2.5 mg doses over the last 3 days. Previously up to 3.34 while on Coumadin 5 mg daily.  Albumin down to 1.5.  Little PO intake.  Platelet count trended down to 97K, no bleeding reported.  Duplex negative for DVT.      Day # 7 Unasyn for VRE and Acinetobacter in 11/23 urine culture. Blood cultures from 11/23 and 11/24 negative.  Blood cultures sent 11/30 pending. Urine culture also ordered.  Vascular notes no evidence of graft infection.  Goal of Therapy:  INR 2-3 Monitor platelets by anticoagulation protocol: Yes   Plan:   Coumadin 1 mg x 1 today.  Continue daily PT/INR.  Continue Unasyn 3gm IV q24hrs,  after HD on HD days. Scheduled for 6pm daily  Follow up repeat cultures.  Arty Baumgartner, [place] Pager: [phone] 02/28/2015,12:42 PM

## 2015-02-28 NOTE — Progress Notes (Signed)
  [facility] Progress Note   Subjective: a bit more lucid today  Filed Vitals:   02/28/15 0830 02/28/15 0900 02/28/15 0917 02/28/15 0930  BP: 113/62 106/59 111/60 103/48  Pulse: 100 105 110 110  Temp:      TempSrc:      Resp:      Height:      Weight:      SpO2:       . ampicillin-sulbactam (UNASYN) IV  3 g Intravenous Q24H  . [START ON 03/02/2015] Darbepoetin Alfa  200 mcg Intravenous Q Sat-HD  . DULoxetine  20 mg Oral Daily  . feeding supplement  1 Container Oral TID BM  . feeding supplement (PRO-STAT SUGAR FREE 64)  30 mL Oral BID  . hydroxychloroquine  400 mg Oral Daily  . metoprolol succinate  25 mg Oral Daily  . midodrine  10 mg Oral BID WC  . pantoprazole  40 mg Oral Daily  . sodium chloride  3 mL Intravenous Q12H  . Warfarin - Pharmacist Dosing Inpatient   Does not apply q1800     sodium chloride, sodium chloride, acetaminophen, acetaminophen, alteplase, heparin, HYDROmorphone (DILAUDID) injection, Influenza vac split quadrivalent PF, lidocaine (PF), lidocaine-prilocaine, ondansetron **OR** ondansetron (ZOFRAN) IV, pentafluoroprop-tetrafluoroeth, pneumococcal 23 valent vaccine, polyethylene glycol, QUEtiapine Exam: WD [name] in NAD No jvd Chest clear bilat RRR no mrg Abd obese ntnd Ext 2+ L thigh edema, no fluctuance or drainage AVG L thigh +bruit Neuro gen weak, ox 3  [facility] TTS 4 hr   87.5kg 2/2 bath L thigh AVG Hep 2600 Aranesp 200/week      Assessment: 1 AMS improving 2 Acinetobacter UTI on Unasyn 2 ESRD TTS HD L thigh graft 3 SLE plaquenil 4 SP CVA/ endocarditis (marantic) 5 Debil 6 SVT on MTP 7 Hypotens on midodrine 8 Anemia max darbe 9 Vol under dry wt, no excess 10 Malnutrition low alb 1.3  Plan - HD today, min UF   Kelly Splinter MD [facility] pager [phone]    cell [phone] 02/28/2015, 9:47 AM    Recent Labs Lab 02/24/15 0417 02/26/15 0438 02/28/15 0619  NA 137 138 139  K 3.8 4.0 4.4  CL 99* 99*  98*  CO2 29 29 30   GLUCOSE 72 76 77  BUN 16 36* 28*  CREATININE 4.92* 6.98* 5.69*  CALCIUM 8.4* 8.3* 8.4*  PHOS 4.2 4.1 4.6    Recent Labs Lab 02/24/15 0417 02/26/15 0438 02/28/15 0619  ALBUMIN 1.8* 1.5* 1.4*    Recent Labs Lab 02/26/15 0438 02/27/15 0618 02/28/15 0619  WBC 7.7 8.8 8.4  HGB 8.8* 8.5* 8.4*  HCT 29.6* 27.9* 28.5*  MCV 93.4 93.3 95.0  PLT 140* 110* 97*

## 2015-02-28 NOTE — Progress Notes (Signed)
Subjective: No acute events overnight. Fully alert and oriented today. Mental status is improved from previously but still has continued concerns about the father of her child. She also endorses occasional auditory hallucinations but improved from previously. Still having issues sleeping but did not receive seroquel last night.  Objective: Vital signs in last 24 hours: Filed Vitals:   02/28/15 1100 02/28/15 1130 02/28/15 1145 02/28/15 1249  BP: 123/62 128/68 118/68 119/68  Pulse: 104 96 96 101  Temp:   98.4 F (36.9 C) 97.6 F (36.4 C)  TempSrc:    Oral  Resp:   20 20  Height:      Weight:      SpO2:       Weight change: -8 lb 2.5 oz (-3.7 kg)  Intake/Output Summary (Last 24 hours) at 02/28/15 1516 Last data filed at 02/28/15 1240  Gross per 24 hour  Intake    120 ml  Output    630 ml  Net   -510 ml   Gen: Ill-appearing, alert and oriented to person, place, and time HEENT: Oropharynx clear without erythema or exudate.  Neck: No cervical LAD, no thyromegaly or nodules, no JVD noted. CV: Normal rate, regular rhythm, no murmurs, rubs, or gallops Pulmonary: Normal effort, CTA bilaterally, no wheezing, rales, or rhonchi Abdominal: Soft, non-tender, non-distended, without rebound, guarding, or masses Extremities: 3+ edema of the left thigh around graft site, 1+ distal LLE edema, left knee, calf, and foot tender to palpation Skin: No atypical appearing moles. No rashes  Lab Results: Basic Metabolic Panel:  Recent Labs Lab 02/26/15 0438 02/28/15 0619  NA 138 139  K 4.0 4.4  CL 99* 98*  CO2 29 30  GLUCOSE 76 77  BUN 36* 28*  CREATININE 6.98* 5.69*  CALCIUM 8.3* 8.4*  PHOS 4.1 4.6   Liver Function Tests:  Recent Labs Lab 02/26/15 0438 02/28/15 0619  ALBUMIN 1.5* 1.4*  CBC:  Recent Labs Lab 02/27/15 0618 02/28/15 0619  WBC 8.8 8.4  HGB 8.5* 8.4*  HCT 27.9* 28.5*  MCV 93.3 95.0  PLT 110* 97*   CardiaCoagulation:  Recent Labs Lab 02/25/15 0435  02/26/15 0438 02/27/15 0618 02/28/15 0619  LABPROT 25.2* 23.9* 25.9* 29.8*  INR 2.31* 2.16* 2.40* 2.90*  Micro Results: Recent Results (from the past 240 hour(s))  Culture, blood (routine x 2)     Status: None   Collection Time: 02/19/15  9:50 AM  Result Value Ref Range Status   Specimen Description BLOOD RIGHT ANTECUBITAL  Final   Special Requests BOTTLES DRAWN AEROBIC AND ANAEROBIC 5CCS  Final   Culture NO GROWTH 5 DAYS  Final   Report Status 02/24/2015 FINAL  Final  Culture, blood (routine x 2)     Status: None   Collection Time: 02/19/15 10:00 AM  Result Value Ref Range Status   Specimen Description BLOOD RIGHT HAND  Final   Special Requests BOTTLES DRAWN AEROBIC AND ANAEROBIC 5CCS  Final   Culture NO GROWTH 5 DAYS  Final   Report Status 02/24/2015 FINAL  Final  Urine culture     Status: None   Collection Time: 02/20/15 12:35 PM  Result Value Ref Range Status   Specimen Description URINE, CATHETERIZED  Final   Special Requests NONE  Final   Culture   Final    >=100,000 COLONIES/mL VANCOMYCIN RESISTANT ENTEROCOCCUS >=100,000 COLONIES/mL ACINETOBACTER CALCOACETICUS/BAUMANNII COMPLEX    Report Status 02/24/2015 FINAL  Final   Organism ID, Bacteria ACINETOBACTER CALCOACETICUS/BAUMANNII COMPLEX  Final  Organism ID, Bacteria VANCOMYCIN RESISTANT ENTEROCOCCUS  Final      Susceptibility   Acinetobacter calcoaceticus/baumannii complex - MIC*    CEFTAZIDIME >=64 RESISTANT Resistant     CEFTRIAXONE >=64 RESISTANT Resistant     CIPROFLOXACIN >=4 RESISTANT Resistant     GENTAMICIN 2 SENSITIVE Sensitive     IMIPENEM 2 SENSITIVE Sensitive     PIP/TAZO 32 INTERMEDIATE Intermediate     TRIMETH/SULFA >=320 RESISTANT Resistant     AMPICILLIN/SULBACTAM 4 SENSITIVE Sensitive     * >=100,000 COLONIES/mL ACINETOBACTER CALCOACETICUS/BAUMANNII COMPLEX   Vancomycin resistant enterococcus - MIC*    AMPICILLIN <=2 SENSITIVE Sensitive     LEVOFLOXACIN >=8 RESISTANT Resistant      NITROFURANTOIN <=16 SENSITIVE Sensitive     VANCOMYCIN >=32 RESISTANT Resistant     LINEZOLID 2 SENSITIVE Sensitive     * >=100,000 COLONIES/mL VANCOMYCIN RESISTANT ENTEROCOCCUS  Culture, blood (routine x 2)     Status: None   Collection Time: 02/20/15  3:30 PM  Result Value Ref Range Status   Specimen Description BLOOD VENOUS  Final   Special Requests BOTTLES DRAWN AEROBIC AND ANAEROBIC 10ML  Final   Culture NO GROWTH 5 DAYS  Final   Report Status 02/25/2015 FINAL  Final  Culture, blood (routine x 2)     Status: None   Collection Time: 02/20/15  4:16 PM  Result Value Ref Range Status   Specimen Description BLOOD ARTERIAL LINE  Final   Special Requests BOTTLES DRAWN AEROBIC AND ANAEROBIC 10ML  Final   Culture NO GROWTH 5 DAYS  Final   Report Status 02/25/2015 FINAL  Final  Culture, blood (routine x 2)     Status: None   Collection Time: 02/21/15  8:36 PM  Result Value Ref Range Status   Specimen Description BLOOD RIGHT HAND  Final   Special Requests IN PEDIATRIC BOTTLE 3CC  Final   Culture NO GROWTH 5 DAYS  Final   Report Status 02/26/2015 FINAL  Final  Culture, blood (routine x 2)     Status: None   Collection Time: 02/21/15  9:21 PM  Result Value Ref Range Status   Specimen Description BLOOD RIGHT THUMB  Final   Special Requests IN PEDIATRIC BOTTLE 1.5CC  Final   Culture NO GROWTH 5 DAYS  Final   Report Status 02/26/2015 FINAL  Final  Culture, blood (routine x 2)     Status: None (Preliminary result)   Collection Time: 02/27/15  3:41 PM  Result Value Ref Range Status   Specimen Description BLOOD RIGHT HAND  Final   Special Requests IN PEDIATRIC BOTTLE  2CC  Final   Culture NO GROWTH < 24 HOURS  Final   Report Status PENDING  Incomplete  Culture, blood (routine x 2)     Status: None (Preliminary result)   Collection Time: 02/27/15  5:10 PM  Result Value Ref Range Status   Specimen Description BLOOD RIGHT HAND  Final   Special Requests IN PEDIATRIC BOTTLE  3CC  Final    Culture NO GROWTH < 24 HOURS  Final   Report Status PENDING  Incomplete   Assessment/Plan: Principal Problem:   Acute encephalopathy Active Problems:   Lupus nephritis (HCC)   ESRD (end stage renal disease) on dialysis (HCC)   Systemic lupus (HCC)   Lower abdominal pain   Knee pain   Sepsis secondary to UTI (HCC)   Pain and swelling of left lower leg  Acute delirium 2/2 UTI: Unclear why symptoms worsened again over  past 2 days with more delusion and hallucination. She remains afebrile with stable vital signs. Will await final read on L thigh [place] to see if any change or progression. 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 She is eager to "prove herself" on PT/OT and make progress towards getting home. -qAM CBC, renal labs -Continue Unasyn 11/25>> -Tylenol prn for fever -Dilaudid 1 mg IV Q4H PRN pain -Quetiapine 25mg  qhs for agitation -Repeat I&O cath for Urine Cx -Repeat blood cultures  Left leg edema: Negative for clot in LLE on initial read. Thigh fluid collection redemonstrated. Unclear if her pain is related simply to worsened edema of the leg. It is neurovascularly intact on exam and the strength seems more limited by pain and her residual deficit from stroke -Obtain US for LEDVT -Continuing abx as above  ESRD on HD: Back on her outpatient TTS schedule. -Midodrine 10mg  for hypotension -Nephrology following, appreciate recs -HD per nephrology  H/o recent R. MCA Embolic CVA: Needs ongoing PT/OT work, and is likely a candidate for CIR again due to loss of her previous progress with this medical admission and infection -Daily PT/INR -Coumadin per pharm -Still needs daily PT/OT treatment  Depression: Very tearful, with flat affect and depressed mood. She was started on Celexa 10mg  at [facility] for about 1 week without adverse effect. 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 Anticipated discharge will depend on treatment plan after further evaluation, approximately 1-3 days.   LOS: 9 days   Norval Gable, MD 02/28/2015, 3:16 PM

## 2015-03-01 DIAGNOSIS — F22 Delusional disorders: Secondary | ICD-10-CM

## 2015-03-01 DIAGNOSIS — F29 Unspecified psychosis not due to a substance or known physiological condition: Secondary | ICD-10-CM | POA: Clinically undetermined

## 2015-03-01 LAB — CBC
HEMATOCRIT: 29.9 % — AB (ref 36.0–46.0)
Hemoglobin: 8.9 g/dL — ABNORMAL LOW (ref 12.0–15.0)
MCH: 28.4 pg (ref 26.0–34.0)
MCHC: 29.8 g/dL — ABNORMAL LOW (ref 30.0–36.0)
MCV: 95.5 fL (ref 78.0–100.0)
Platelets: 64 10*3/uL — ABNORMAL LOW (ref 150–400)
RBC: 3.13 MIL/uL — AB (ref 3.87–5.11)
RDW: 16.7 % — ABNORMAL HIGH (ref 11.5–15.5)
WBC: 9.3 10*3/uL (ref 4.0–10.5)

## 2015-03-01 LAB — PROTIME-INR
INR: 2.99 — ABNORMAL HIGH (ref 0.00–1.49)
Prothrombin Time: 30.5 seconds — ABNORMAL HIGH (ref 11.6–15.2)

## 2015-03-01 MED ORDER — OLANZAPINE 5 MG PO TBDP
5.0000 mg | ORAL_TABLET | Freq: Two times a day (BID) | ORAL | Status: DC
  Administered 2015-03-01 – 2015-03-04 (×5): 5 mg via ORAL
  Filled 2015-03-01 (×8): qty 1

## 2015-03-01 MED ORDER — WARFARIN SODIUM 2 MG PO TABS
2.0000 mg | ORAL_TABLET | Freq: Once | ORAL | Status: AC
  Administered 2015-03-01: 2 mg via ORAL
  Filled 2015-03-01: qty 1

## 2015-03-01 MED ORDER — RENA-VITE PO TABS
1.0000 | ORAL_TABLET | Freq: Two times a day (BID) | ORAL | Status: DC
  Administered 2015-03-01 – 2015-03-22 (×32): 1 via ORAL
  Filled 2015-03-01 (×41): qty 1

## 2015-03-01 MED ORDER — QUETIAPINE FUMARATE ER 50 MG PO TB24
100.0000 mg | ORAL_TABLET | Freq: Every day | ORAL | Status: DC
  Filled 2015-03-01: qty 2

## 2015-03-01 MED ORDER — DULOXETINE HCL 30 MG PO CPEP
30.0000 mg | ORAL_CAPSULE | Freq: Every day | ORAL | Status: DC
Start: 2015-03-02 — End: 2015-03-04
  Administered 2015-03-02 – 2015-03-03 (×2): 30 mg via ORAL
  Filled 2015-03-01 (×2): qty 1

## 2015-03-01 MED ORDER — QUETIAPINE FUMARATE ER 50 MG PO TB24
50.0000 mg | ORAL_TABLET | Freq: Every day | ORAL | Status: DC
  Filled 2015-03-01: qty 1

## 2015-03-01 MED ORDER — B COMPLEX-C PO TABS
1.0000 | ORAL_TABLET | Freq: Every day | ORAL | Status: DC
  Administered 2015-03-01 – 2015-03-17 (×13): 1 via ORAL
  Filled 2015-03-01 (×18): qty 1

## 2015-03-01 NOTE — Progress Notes (Deleted)
Subjective: No acute events overnight. Fully alert and oriented today. Mental status is improved from previously but still has continued concerns about the father of her child. She also endorses occasional auditory hallucinations but improved from previously. Still having issues sleeping but did not receive seroquel last night.  Objective: Vital signs in last 24 hours: Filed Vitals:   02/28/15 1249 02/28/15 1748 02/28/15 2100 03/01/15 0500  BP: 119/68 119/80 130/93 126/82  Pulse: 101 108 94 115  Temp: 97.6 F (36.4 C) 97.8 F (36.6 C) 99 F (37.2 C) 97.3 F (36.3 C)  TempSrc: Oral Oral Oral Oral  Resp: 20 18 18 18   Height:      Weight:   178 lb 9.6 oz (81.012 kg)   SpO2: 100% 98% 100% 99%   Weight change: 11 oz (0.312 kg)  Intake/Output Summary (Last 24 hours) at 03/01/15 0744 Last data filed at 03/01/15 0646  Gross per 24 hour  Intake    920 ml  Output    630 ml  Net    290 ml   Gen: Ill-appearing, alert and oriented to person, place, and time HEENT: Oropharynx clear without erythema or exudate.  Neck: No cervical LAD, no thyromegaly or nodules, no JVD noted. CV: Normal rate, regular rhythm, no murmurs, rubs, or gallops Pulmonary: Normal effort, CTA bilaterally, no wheezing, rales, or rhonchi Abdominal: Soft, non-tender, non-distended, without rebound, guarding, or masses Extremities: 3+ edema of the left thigh around graft site, 1+ distal LLE edema, left knee, calf, and foot tender to palpation Skin: No atypical appearing moles. No rashes  Lab Results: Basic Metabolic Panel:  Recent Labs Lab 02/26/15 0438 02/28/15 0619  NA 138 139  K 4.0 4.4  CL 99* 98*  CO2 29 30  GLUCOSE 76 77  BUN 36* 28*  CREATININE 6.98* 5.69*  CALCIUM 8.3* 8.4*  PHOS 4.1 4.6   Liver Function Tests:  Recent Labs Lab 02/26/15 0438 02/28/15 0619  ALBUMIN 1.5* 1.4*  CBC:  Recent Labs Lab 02/27/15 0618 02/28/15 0619  WBC 8.8 8.4  HGB 8.5* 8.4*  HCT 27.9* 28.5*  MCV 93.3  95.0  PLT 110* 97*   CardiaCoagulation:  Recent Labs Lab 02/25/15 0435 02/26/15 0438 02/27/15 0618 02/28/15 0619  LABPROT 25.2* 23.9* 25.9* 29.8*  INR 2.31* 2.16* 2.40* 2.90*  Micro Results: Recent Results (from the past 240 hour(s))  Culture, blood (routine x 2)     Status: None   Collection Time: 02/19/15  9:50 AM  Result Value Ref Range Status   Specimen Description BLOOD RIGHT ANTECUBITAL  Final   Special Requests BOTTLES DRAWN AEROBIC AND ANAEROBIC 5CCS  Final   Culture NO GROWTH 5 DAYS  Final   Report Status 02/24/2015 FINAL  Final  Culture, blood (routine x 2)     Status: None   Collection Time: 02/19/15 10:00 AM  Result Value Ref Range Status   Specimen Description BLOOD RIGHT HAND  Final   Special Requests BOTTLES DRAWN AEROBIC AND ANAEROBIC 5CCS  Final   Culture NO GROWTH 5 DAYS  Final   Report Status 02/24/2015 FINAL  Final  Urine culture     Status: None   Collection Time: 02/20/15 12:35 PM  Result Value Ref Range Status   Specimen Description URINE, CATHETERIZED  Final   Special Requests NONE  Final   Culture   Final    >=100,000 COLONIES/mL VANCOMYCIN RESISTANT ENTEROCOCCUS >=100,000 COLONIES/mL ACINETOBACTER CALCOACETICUS/BAUMANNII COMPLEX    Report Status 02/24/2015 FINAL  Final  Organism ID, Bacteria ACINETOBACTER CALCOACETICUS/BAUMANNII COMPLEX  Final   Organism ID, Bacteria VANCOMYCIN RESISTANT ENTEROCOCCUS  Final      Susceptibility   Acinetobacter calcoaceticus/baumannii complex - MIC*    CEFTAZIDIME >=64 RESISTANT Resistant     CEFTRIAXONE >=64 RESISTANT Resistant     CIPROFLOXACIN >=4 RESISTANT Resistant     GENTAMICIN 2 SENSITIVE Sensitive     IMIPENEM 2 SENSITIVE Sensitive     PIP/TAZO 32 INTERMEDIATE Intermediate     TRIMETH/SULFA >=320 RESISTANT Resistant     AMPICILLIN/SULBACTAM 4 SENSITIVE Sensitive     * >=100,000 COLONIES/mL ACINETOBACTER CALCOACETICUS/BAUMANNII COMPLEX   Vancomycin resistant enterococcus - MIC*    AMPICILLIN <=2  SENSITIVE Sensitive     LEVOFLOXACIN >=8 RESISTANT Resistant     NITROFURANTOIN <=16 SENSITIVE Sensitive     VANCOMYCIN >=32 RESISTANT Resistant     LINEZOLID 2 SENSITIVE Sensitive     * >=100,000 COLONIES/mL VANCOMYCIN RESISTANT ENTEROCOCCUS  Culture, blood (routine x 2)     Status: None   Collection Time: 02/20/15  3:30 PM  Result Value Ref Range Status   Specimen Description BLOOD VENOUS  Final   Special Requests BOTTLES DRAWN AEROBIC AND ANAEROBIC 10ML  Final   Culture NO GROWTH 5 DAYS  Final   Report Status 02/25/2015 FINAL  Final  Culture, blood (routine x 2)     Status: None   Collection Time: 02/20/15  4:16 PM  Result Value Ref Range Status   Specimen Description BLOOD ARTERIAL LINE  Final   Special Requests BOTTLES DRAWN AEROBIC AND ANAEROBIC 10ML  Final   Culture NO GROWTH 5 DAYS  Final   Report Status 02/25/2015 FINAL  Final  Culture, blood (routine x 2)     Status: None   Collection Time: 02/21/15  8:36 PM  Result Value Ref Range Status   Specimen Description BLOOD RIGHT HAND  Final   Special Requests IN PEDIATRIC BOTTLE 3CC  Final   Culture NO GROWTH 5 DAYS  Final   Report Status 02/26/2015 FINAL  Final  Culture, blood (routine x 2)     Status: None   Collection Time: 02/21/15  9:21 PM  Result Value Ref Range Status   Specimen Description BLOOD RIGHT THUMB  Final   Special Requests IN PEDIATRIC BOTTLE 1.5CC  Final   Culture NO GROWTH 5 DAYS  Final   Report Status 02/26/2015 FINAL  Final  Culture, blood (routine x 2)     Status: None (Preliminary result)   Collection Time: 02/27/15  3:41 PM  Result Value Ref Range Status   Specimen Description BLOOD RIGHT HAND  Final   Special Requests IN PEDIATRIC BOTTLE  2CC  Final   Culture NO GROWTH < 24 HOURS  Final   Report Status PENDING  Incomplete  Culture, blood (routine x 2)     Status: None (Preliminary result)   Collection Time: 02/27/15  5:10 PM  Result Value Ref Range Status   Specimen Description BLOOD RIGHT  HAND  Final   Special Requests IN PEDIATRIC BOTTLE  3CC  Final   Culture NO GROWTH < 24 HOURS  Final   Report Status PENDING  Incomplete   Assessment/Plan: Principal Problem:   Acute encephalopathy Active Problems:   Lupus nephritis (HCC)   ESRD (end stage renal disease) on dialysis (HCC)   Systemic lupus (HCC)   Lower abdominal pain   Knee pain   Sepsis secondary to UTI (HCC)   Pain and swelling of left lower leg  Pressure ulcer  Acute psychotic symptoms: Symptoms worsened again over past few days with more paranoid delusion and auditory hallucination. Possibly 2/2 UTI, but also occuring in setting of acute depressive symptoms so may be major depression with psychotic symptoms. She remains afebrile with stable vital signs. 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 She is eager to "prove herself" on PT/OT and make progress towards getting home.  -qAM CBC, renal labs -Continue Unasyn 11/25>> -Tylenol prn for fever -Dilaudid 1 mg IV Q4H PRN pain -Changed to quetiapine XR 100mg  qhs - will up-titrate to 150mg  qhs and re-check EKG once full dose reached -F/u blood and urine cultures  Left leg edema: Negative for clot in LLE on initial read. Thigh fluid collection redemonstrated. Unclear if her pain is related simply to worsened edema of the leg. It is neurovascularly intact on exam and the strength seems more limited by pain and her residual deficit from stroke -[place] for LEDVT -Continuing abx as above  ESRD on HD: Back on her outpatient TTS schedule. -Midodrine 10mg  for hypotension -Nephrology following, appreciate recs -HD per nephrology  H/o recent R. MCA Embolic CVA: Needs ongoing PT/OT work, and is likely a candidate for CIR again due to loss of her previous progress with this medical admission and infection -Daily PT/INR -Coumadin per pharm -Still needs daily PT/OT treatment  Depression:  Very tearful, with flat affect and depressed mood. She was started on [facility]lexa 10mg  at [facility] for about 1 week without adverse effect. -On duloxetine 20mg  QD  SLE: Continue Plaquenil H/O SVT/Tachycardia: Continue Toprol-XL 25mg   Diet: Renal DVT ppx: Coumadin FULL CODE  Dispo: Disposition is deferred at this time, awaiting improvement of current medical problems. Anticipated discharge will depend on treatment plan after further evaluation, approximately 1-3 days.   LOS: 10 days   Norval Gable, MD 03/01/2015, 7:44 AM

## 2015-03-01 NOTE — Progress Notes (Signed)
ANTICOAGULATION CONSULT NOTE - Follow Up Consult  Pharmacy Consult for Coumadin and Unasyn Indication: stroke and hypercoaguable state;  UTI   Patient Measurements: Height: 5\' 3"  (160 cm) Weight: 178 lb 9.6 oz (81.012 kg) IBW/kg (Calculated) : 52.4  Vital Signs: Temp: 97.3 F (36.3 C) (12/02 0500) Temp Source: Oral (12/02 0500) BP: 126/82 mmHg (12/02 0500) Pulse Rate: 115 (12/02 0500)  Labs:  Recent Labs  02/27/15 0618 02/28/15 0619 03/01/15 0915  HGB 8.5* 8.4* 8.9*  HCT 27.9* 28.5* 29.9*  PLT 110* 97* 64*  LABPROT 25.9* 29.8* 30.5*  INR 2.40* 2.90* 2.99*  CREATININE  --  5.69*  --     Assessment: 39 yr old female with ESRD on Coumadin for history of SLE, stroke, and hypercoagulable state. She had been on Coumadin 5 mg daily from 11/16-11/22/16, with gradual rise to therapeutic INR after 5 days. Coumadin held/reversed on 11/23, with possible need for lumbar puncture or other procedure. On Unasyn for UTI.   Date 11/28 11/29 11/30  12/1 12/2  INR 2.31 2.16 2.4 2.9 2.99  Coumadin dose 4 mg 5 mg 2.5 mg 1 mg 2 mg      Microbiology:  Cx data: 11/23 urine:  VRE S to amp,            A.Saumanni S to unasyn  11/23 blood: ngF 11/24 blood: ngF 11/30 blood: ngtd    Anti-infective's  Unasyn: 11/25 >>   Goal of Therapy:  INR 2-3 Monitor platelets by anticoagulation protocol: Yes   Plan:  Warfarin/Coumadin  - Coumadin 2 mg x 1 today.  - Continue daily PT/INR.  Antibiotics   - Continue Unasyn 3gm IV q24hrs, after HD on HD days. Scheduled for 6pm daily - Currently day 8 with planned 14 days per primary   Vincenza Hews, PharmD, BCPS 03/01/2015, 10:36 AM Pager: [phone]

## 2015-03-01 NOTE — Progress Notes (Signed)
Subjective: No acute events overnight. Fully alert and oriented today. However, continues to have persistent auditory hallucinations and paranoid delusions about the father of her children harming her and her family, as well as the staff of the hospital. She is eager to leave and wants to go home ASAP. She does not feel she is safe in the hospital and would like to go home.  Objective: Vital signs in last 24 hours: Filed Vitals:   02/28/15 1249 02/28/15 1748 02/28/15 2100 03/01/15 0500  BP: 119/68 119/80 130/93 126/82  Pulse: 101 108 94 115  Temp: 97.6 F (36.4 C) 97.8 F (36.6 C) 99 F (37.2 C) 97.3 F (36.3 C)  TempSrc: Oral Oral Oral Oral  Resp: 20 18 18 18   Height:      Weight:   178 lb 9.6 oz (81.012 kg)   SpO2: 100% 98% 100% 99%   Weight change: 11 oz (0.312 kg)  Intake/Output Summary (Last 24 hours) at 03/01/15 0750 Last data filed at 03/01/15 0646  Gross per 24 hour  Intake    920 ml  Output    630 ml  Net    290 ml   Gen: Ill-appearing, alert and oriented to person, place, and time HEENT: Oropharynx clear without erythema or exudate.  Neck: No cervical LAD, no thyromegaly or nodules, no JVD noted. CV: Normal rate, regular rhythm, no murmurs, rubs, or gallops Pulmonary: Normal effort, CTA bilaterally, no wheezing, rales, or rhonchi Abdominal: Soft, non-tender, non-distended, without rebound, guarding, or masses Extremities: 3+ edema of the left thigh around graft site, 1+ distal LLE edema, left knee, calf, and foot tender to palpation Skin: No atypical appearing moles. No rashes Psych: Paranoid delusions and auditory hallucinations, with ideas of reference ('hears about them coming to get her on the news'). Also has slightly pressured speech which is new as compared to previous exams. Also endorses depressive symptoms without SI or HI. No other manic symptoms noted at this time. Otherwise no other apparent signs of altered consciousness; patient is  non-distractable.  Lab Results: Basic Metabolic Panel:  Recent Labs Lab 02/26/15 0438 02/28/15 0619  NA 138 139  K 4.0 4.4  CL 99* 98*  CO2 29 30  GLUCOSE 76 77  BUN 36* 28*  CREATININE 6.98* 5.69*  CALCIUM 8.3* 8.4*  PHOS 4.1 4.6   Liver Function Tests:  Recent Labs Lab 02/26/15 0438 02/28/15 0619  ALBUMIN 1.5* 1.4*  CBC:  Recent Labs Lab 02/27/15 0618 02/28/15 0619  WBC 8.8 8.4  HGB 8.5* 8.4*  HCT 27.9* 28.5*  MCV 93.3 95.0  PLT 110* 97*   CardiaCoagulation:  Recent Labs Lab 02/25/15 0435 02/26/15 0438 02/27/15 0618 02/28/15 0619  LABPROT 25.2* 23.9* 25.9* 29.8*  INR 2.31* 2.16* 2.40* 2.90*  Micro Results: Recent Results (from the past 240 hour(s))  Culture, blood (routine x 2)     Status: None   Collection Time: 02/19/15  9:50 AM  Result Value Ref Range Status   Specimen Description BLOOD RIGHT ANTECUBITAL  Final   Special Requests BOTTLES DRAWN AEROBIC AND ANAEROBIC 5CCS  Final   Culture NO GROWTH 5 DAYS  Final   Report Status 02/24/2015 FINAL  Final  Culture, blood (routine x 2)     Status: None   Collection Time: 02/19/15 10:00 AM  Result Value Ref Range Status   Specimen Description BLOOD RIGHT HAND  Final   Special Requests BOTTLES DRAWN AEROBIC AND ANAEROBIC 5CCS  Final   Culture NO GROWTH  5 DAYS  Final   Report Status 02/24/2015 FINAL  Final  Urine culture     Status: None   Collection Time: 02/20/15 12:35 PM  Result Value Ref Range Status   Specimen Description URINE, CATHETERIZED  Final   Special Requests NONE  Final   Culture   Final    >=100,000 COLONIES/mL VANCOMYCIN RESISTANT ENTEROCOCCUS >=100,000 COLONIES/mL ACINETOBACTER CALCOACETICUS/BAUMANNII COMPLEX    Report Status 02/24/2015 FINAL  Final   Organism ID, Bacteria ACINETOBACTER CALCOACETICUS/BAUMANNII COMPLEX  Final   Organism ID, Bacteria VANCOMYCIN RESISTANT ENTEROCOCCUS  Final      Susceptibility   Acinetobacter calcoaceticus/baumannii complex - MIC*    CEFTAZIDIME  >=64 RESISTANT Resistant     CEFTRIAXONE >=64 RESISTANT Resistant     CIPROFLOXACIN >=4 RESISTANT Resistant     GENTAMICIN 2 SENSITIVE Sensitive     IMIPENEM 2 SENSITIVE Sensitive     PIP/TAZO 32 INTERMEDIATE Intermediate     TRIMETH/SULFA >=320 RESISTANT Resistant     AMPICILLIN/SULBACTAM 4 SENSITIVE Sensitive     * >=100,000 COLONIES/mL ACINETOBACTER CALCOACETICUS/BAUMANNII COMPLEX   Vancomycin resistant enterococcus - MIC*    AMPICILLIN <=2 SENSITIVE Sensitive     LEVOFLOXACIN >=8 RESISTANT Resistant     NITROFURANTOIN <=16 SENSITIVE Sensitive     VANCOMYCIN >=32 RESISTANT Resistant     LINEZOLID 2 SENSITIVE Sensitive     * >=100,000 COLONIES/mL VANCOMYCIN RESISTANT ENTEROCOCCUS  Culture, blood (routine x 2)     Status: None   Collection Time: 02/20/15  3:30 PM  Result Value Ref Range Status   Specimen Description BLOOD VENOUS  Final   Special Requests BOTTLES DRAWN AEROBIC AND ANAEROBIC 10ML  Final   Culture NO GROWTH 5 DAYS  Final   Report Status 02/25/2015 FINAL  Final  Culture, blood (routine x 2)     Status: None   Collection Time: 02/20/15  4:16 PM  Result Value Ref Range Status   Specimen Description BLOOD ARTERIAL LINE  Final   Special Requests BOTTLES DRAWN AEROBIC AND ANAEROBIC 10ML  Final   Culture NO GROWTH 5 DAYS  Final   Report Status 02/25/2015 FINAL  Final  Culture, blood (routine x 2)     Status: None   Collection Time: 02/21/15  8:36 PM  Result Value Ref Range Status   Specimen Description BLOOD RIGHT HAND  Final   Special Requests IN PEDIATRIC BOTTLE 3CC  Final   Culture NO GROWTH 5 DAYS  Final   Report Status 02/26/2015 FINAL  Final  Culture, blood (routine x 2)     Status: None   Collection Time: 02/21/15  9:21 PM  Result Value Ref Range Status   Specimen Description BLOOD RIGHT THUMB  Final   Special Requests IN PEDIATRIC BOTTLE 1.5CC  Final   Culture NO GROWTH 5 DAYS  Final   Report Status 02/26/2015 FINAL  Final  Culture, blood (routine x 2)      Status: None (Preliminary result)   Collection Time: 02/27/15  3:41 PM  Result Value Ref Range Status   Specimen Description BLOOD RIGHT HAND  Final   Special Requests IN PEDIATRIC BOTTLE  2CC  Final   Culture NO GROWTH < 24 HOURS  Final   Report Status PENDING  Incomplete  Culture, blood (routine x 2)     Status: None (Preliminary result)   Collection Time: 02/27/15  5:10 PM  Result Value Ref Range Status   Specimen Description BLOOD RIGHT HAND  Final   Special Requests IN PEDIATRIC  BOTTLE  3CC  Final   Culture NO GROWTH < 24 HOURS  Final   Report Status PENDING  Incomplete   Assessment/Plan: Principal Problem:   Acute encephalopathy Active Problems:   Lupus nephritis ([place])   ESRD (end stage renal disease) on dialysis (HCC)   Systemic lupus (HCC)   Lower abdominal pain   Knee pain   Sepsis secondary to UTI (HCC)   Pain and swelling of left lower leg   Pressure ulcer  Acute psychotic symptoms: Symptoms worsened again over past few days with more paranoid delusion and auditory hallucination. Possibly 2/2 UTI, but also occuring in setting of acute depressive symptoms so may be major depression with psychotic symptoms. She remains afebrile with stable vital signs. 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 She is eager to "prove herself" on PT/OT and make progress towards getting home.  -qAM CBC, renal labs -Continue Unasyn 11/25>> -Tylenol prn for fever -Dilaudid 1 mg IV Q4H PRN pain -Changed to quetiapine XR 50mg  qhs - will up-titrate to 150mg  qhs and re-check EKG once full dose reached -Consulted psychiatry this morning - will see today - appreciate their recs -F/u blood and urine cultures  Left leg edema: Negative for clot in LLE on initial read. Thigh fluid collection redemonstrated. Unclear if her pain is related simply to worsened edema of the leg. It is neurovascularly intact on exam and  the strength seems more limited by pain and her residual deficit from stroke -[place] for LEDVT -Continuing abx as above  ESRD on HD: Back on her outpatient TTS schedule. -Midodrine 10mg  for hypotension -Nephrology following, appreciate recs -HD per nephrology  H/o recent R. MCA Embolic CVA: Needs ongoing PT/OT work, and is likely a candidate for CIR again due to loss of her previous progress with this medical admission and infection -Daily PT/INR -Coumadin per pharm -Still needs daily PT/OT treatment  Depression: Very tearful, with flat affect and depressed mood. She was started on Celexa 10mg  at CIR for about 1 week without adverse effect. -On duloxetine 20mg  QD  SLE: Continue Plaquenil H/O SVT/Tachycardia: Continue Toprol-XL 25mg   Diet: Renal DVT ppx: Coumadin FULL CODE  Dispo: Disposition is deferred at this time, awaiting improvement of current medical problems. Anticipated discharge will depend on treatment plan after further evaluation, approximately 1-3 days.   LOS: 10 days   Norval Gable, MD 03/01/2015, 7:50 AM

## 2015-03-01 NOTE — Progress Notes (Signed)
  [facility] Progress Note   Subjective: still hallucinating speaking to her daughter's father   Danley Danker Vitals:   02/28/15 1748 02/28/15 2100 03/01/15 0500 03/01/15 1000  BP: 119/80 130/93 126/82 134/77  Pulse: 108 94 115 93  Temp: 97.8 F (36.6 C) 99 F (37.2 C) 97.3 F (36.3 C) 99.4 F (37.4 C)  TempSrc: Oral Oral Oral Oral  Resp: 18 18 18 20   Height:      Weight:  81.012 kg (178 lb 9.6 oz)    SpO2: 98% 100% 99% 100%   . ampicillin-sulbactam (UNASYN) IV  3 g Intravenous Q24H  . B-complex with vitamin C  1 tablet Oral Daily  . Darbepoetin Alfa  200 mcg Intravenous Q Thu-HD  . DULoxetine  20 mg Oral Daily  . feeding supplement  1 Container Oral TID BM  . feeding supplement (PRO-STAT SUGAR FREE 64)  30 mL Oral BID  . hydroxychloroquine  400 mg Oral Daily  . metoprolol succinate  25 mg Oral Daily  . midodrine  10 mg Oral BID WC  . multivitamin  1 tablet Oral BID  . pantoprazole  40 mg Oral Daily  . QUEtiapine  50 mg Oral QHS  . sodium chloride  3 mL Intravenous Q12H  . warfarin  2 mg Oral ONCE-1800  . Warfarin - Pharmacist Dosing Inpatient   Does not apply q1800     acetaminophen, acetaminophen, HYDROmorphone (DILAUDID) injection, Influenza vac split quadrivalent PF, ondansetron **OR** ondansetron (ZOFRAN) IV, pneumococcal 23 valent vaccine, polyethylene glycol Exam: WD [name] in NAD No jvd Chest clear bilat RRR no mrg Abd obese ntnd Ext 2+ L thigh edema, no fluctuance or drainage AVG L thigh +bruit Neuro gen weak, ox 3  [facility] TTS 4 hr   87.5kg 2/2 bath L thigh AVG Hep 2600 Aranesp 200/week      Assessment: 1 AMS / psychosis 2 Acinetobacter UTI on Unasyn 2 ESRD TTS HD L thigh graft 3 SLE plaquenil 4 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4*    Recent Labs Lab 02/27/15 0618 02/28/15 0619 03/01/15 0915  WBC 8.8 8.4 9.3  HGB 8.5* 8.4* 8.9*  HCT 27.9* 28.5* 29.9*  MCV 93.3 95.0 95.5  PLT 110* 97* 64*

## 2015-03-01 NOTE — Consult Note (Signed)
[facility] Consult   Reason for Consult:  Hallucinations and paranoid delusions and medication management Referring Physician:  Dr. Dareen Piano Patient Identification: [name] MRN:  [id number] Principal Diagnosis: Psychosis Diagnosis:   Patient Active Problem List   Diagnosis Date Noted  . Psychosis [F29] 03/01/2015  . Pressure ulcer [L89.90] 02/28/2015  . Pain and swelling of left lower leg [M79.605, M79.89]   . Knee pain [M25.569]   . Sepsis secondary to UTI ([place]) [A41.9, N39.0]   . Lower abdominal pain [R10.30]   . Acute encephalopathy [G93.40] 02/19/2015  . Systemic lupus ([place]) [M32.9]   . Left hemiparesis ([place]) [G81.94]   . 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 Steal syndrome as complication of dialysis access ([place]) [T82.898A]   . ESRD (end stage renal disease) on dialysis ([place]) [N18.6, Z99.2]   . 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 female patient admitted with hallucinations and paranoid delusions.  HPI:  [name] is a 39 y.o. female seen, chart reviewed for psychiatric consultation and evaluation of increased psychosis including auditory/visual hallucinations and paranoid delusions. Patient reportedly hearing people talking with her and also seeing her daughter father who has been date to harm her. Patient reported she cannot trust people in the hospital and she needed to go to the home. Patient understand she underwent surgery and has multiple medical problems and needed inpatient for treatment. Patient reportedly had a multiple family members at home and feels comfortable at home only. Patient reported she could not sleep last night because of hallucinations and delusions. Patient is willing to try different medication which will help her to relax and sleep. Patient denies symptoms of depression, mania and psychosis suicidal/homicidal ideation,  intention or plans. Patient understand that she has infection and inflammation needed medication treatment.   Past Psychiatric History: Patient denied previous history of acute psychiatric hospitalizations or outpatient medication management  Risk to Self: Is patient at risk for suicide?: No Risk to Others:   Prior Inpatient Therapy:   Prior Outpatient Therapy:    Past Medical History:  Past Medical History  Diagnosis Date  . Hypertension   . Cardiac arrhythmia   . SVT (supraventricular tachycardia) ([place])     "today, last week, 2 wk ago; maybe 1 month ago, etc; started w/in last 3-4 yrs"(07/20/2012)  . Fainting     "~ 1 month ago; probably related to SVT" (07/20/2012)  . Shortness of breath     "related to SVT episodes" (07/20/2012)  . Chest pain at rest     "related to SVT" (07/20/2012)  . Lupus (systemic lupus erythematosus) ([place])   . GERD (gastroesophageal reflux disease)   . Fibromyalgia   . Irritable bowel syndrome (IBS)   . Hypercholesterolemia   . Heart murmur     "small" (12/25/2014)  . TIA (transient ischemic attack) 12/21/2014  . Sleep apnea     "don't wear mask anymore" (12/25/2014)  . Anemia   . History of blood transfusion "several"    "related to low counts"  . Daily headache   . Arthritis     "hips" (12/25/2014)  . Anxiety     "sometimes; don't take anything for it" (12/25/2014)  . ESRD (end stage renal disease) on dialysis ([address]) since 12/07/2014    Diagnosed Aug 2014 w SLE nephritis DPGN by biopsy, rx cellcept/ steroids.  Repeat biopsy early 2016 membranous w/o  activity so meds weaned off. Big flare Aug '16 creat 6, 3rd biopsy DPGN, meds resumed. Ended up starting HD Sept 2016  . Stroke ([place])   . History of vascular access device     2016: Sept 9  R IJ cath per IR.  Sept 20 not candidate for fistula due to disease veins, had LUA hybrid graft placed. Sept 21 - steal syndrome, LUA AVG ligated. Oct 7 - new left femoral Gore-tex loop graft per [facility]. Oct 29 - removal R IJ  tunneled HD cath    Past Surgical History  Procedure Laterality Date  . Cesarean section  2001; 2010  . Cervical biopsy  2013  . Supraventricular tachycardia ablation  07/21/12     Dr. Cristopher Peru   . Peripheral vascular catheterization N/A 12/14/2014    Procedure: Upper Extremity Venography;  Surgeon: Elam Dutch, MD;  Location: [facility];  Service: Cardiovascular;  Laterality: N/A;  . Insertion hybrid anteriovenous gortex graft Left 12/18/2014    Procedure: INSERTION GORE HYBRID ARTERIOVENOUS GRAFT LEFT AXILLO-BRACHIAL.;  Surgeon: Serafina Mitchell, MD;  Location[facility];  Service: Vascular;  Laterality: Left;  . Ligation of arteriovenous  fistula Left 12/19/2014    Procedure: LIGATION OF FISTULA;  Surgeon: Elam Dutch, MD;  Location: [facility];  Service: Vascular;  Laterality: Left;  . Appendectomy  2011  . Tubal ligation  2010  . Tee without cardioversion N/A 12/25/2014    Procedure: TRANSESOPHAGEAL ECHOCARDIOGRAM (TEE);  Surgeon: Sueanne Margarita, MD;  Location: [facility];  Service: Cardiovascular;  Laterality: N/A;  . Av fistula placement Left 01/04/2015    Procedure: INSERTION OF ARTERIOVENOUS (AV) GORE-TEX GRAFT THIGH;  Surgeon: Rosetta Posner, MD;  Location: [facility];  Service: Vascular;  Laterality: Left;  . Tee without cardioversion N/A 01/30/2015    Procedure: TRANSESOPHAGEAL ECHOCARDIOGRAM (TEE);  Surgeon: Lelon Perla, MD;  Location: [facility] ENDOSCOPY;  Service: Cardiovascular;  Laterality: N/A;   Family History:  Family History  Problem Relation Age of Onset  . Cancer Mother     breast and ovarian  . BRCA 1/2 Sister    Family Psychiatric  History: Denied Social History:  History  Alcohol Use No    Comment: 07/20/2012 "might have a beer once/yr"     History  Drug Use No    Social History   Social History  . Marital Status: Single    Spouse Name: N/A  . Number of Children: N/A  . Years of Education: N/A   Social History Main Topics  . Smoking status: Never  Smoker   . Smokeless tobacco: Never Used  . Alcohol Use: No     Comment: 07/20/2012 "might have a beer once/yr"  . Drug Use: No  . Sexual Activity: Not Currently    Birth Control/ Protection: None   Other Topics Concern  . None   Social History Narrative   Additional Social History:                          Allergies:   Allergies  Allergen Reactions  . Food Swelling    Red peppers    Labs:  Results for orders placed or performed during the hospital encounter of 02/19/15 (from the past 48 hour(s))  Culture, blood (routine x 2)     Status: None (Preliminary result)   Collection Time: 02/27/15  3:41 PM  Result Value Ref Range   Specimen Description BLOOD RIGHT HAND  Special Requests IN PEDIATRIC BOTTLE  2CC    Culture NO GROWTH < 24 HOURS    Report Status PENDING   Culture, blood (routine x 2)     Status: None (Preliminary result)   Collection Time: 02/27/15  5:10 PM  Result Value Ref Range   Specimen Description BLOOD RIGHT HAND    Special Requests IN PEDIATRIC BOTTLE  3CC    Culture NO GROWTH < 24 HOURS    Report Status PENDING   Protime-INR     Status: Abnormal   Collection Time: 02/28/15  6:19 AM  Result Value Ref Range   Prothrombin Time 29.8 (H) 11.6 - 15.2 seconds   INR 2.90 (H) 0.00 - 1.49  CBC     Status: Abnormal   Collection Time: 02/28/15  6:19 AM  Result Value Ref Range   WBC 8.4 4.0 - 10.5 K/uL   RBC 3.00 (L) 3.87 - 5.11 MIL/uL   Hemoglobin 8.4 (L) 12.0 - 15.0 g/dL   HCT 28.5 (L) 36.0 - 46.0 %   MCV 95.0 78.0 - 100.0 fL   MCH 28.0 26.0 - 34.0 pg   MCHC 29.5 (L) 30.0 - 36.0 g/dL   RDW 16.9 (H) 11.5 - 15.5 %   Platelets 97 (L) 150 - 400 K/uL    Comment: CONSISTENT WITH PREVIOUS RESULT  Renal function panel     Status: Abnormal   Collection Time: 02/28/15  6:19 AM  Result Value Ref Range   Sodium 139 135 - 145 mmol/L   Potassium 4.4 3.5 - 5.1 mmol/L   Chloride 98 (L) 101 - 111 mmol/L   CO2 30 22 - 32 mmol/L   Glucose, Bld 77 65 - 99  mg/dL   BUN 28 (H) 6 - 20 mg/dL   Creatinine, Ser 5.69 (H) 0.44 - 1.00 mg/dL   Calcium 8.4 (L) 8.9 - 10.3 mg/dL   Phosphorus 4.6 2.5 - 4.6 mg/dL   Albumin 1.4 (L) 3.5 - 5.0 g/dL   GFR calc non Af Amer 9 (L) >60 mL/min   GFR calc Af Amer 10 (L) >60 mL/min    Comment: (NOTE) The eGFR has been calculated using the CKD EPI equation. This calculation has not been validated in all clinical situations. eGFR's persistently <60 mL/min signify possible Chronic Kidney Disease.    Anion gap 11 5 - 15  Protime-INR     Status: Abnormal   Collection Time: 03/01/15  9:15 AM  Result Value Ref Range   Prothrombin Time 30.5 (H) 11.6 - 15.2 seconds   INR 2.99 (H) 0.00 - 1.49  CBC     Status: Abnormal   Collection Time: 03/01/15  9:15 AM  Result Value Ref Range   WBC 9.3 4.0 - 10.5 K/uL   RBC 3.13 (L) 3.87 - 5.11 MIL/uL   Hemoglobin 8.9 (L) 12.0 - 15.0 g/dL   HCT 29.9 (L) 36.0 - 46.0 %   MCV 95.5 78.0 - 100.0 fL   MCH 28.4 26.0 - 34.0 pg   MCHC 29.8 (L) 30.0 - 36.0 g/dL   RDW 16.7 (H) 11.5 - 15.5 %   Platelets 64 (L) 150 - 400 K/uL    Comment: REPEATED TO VERIFY SPECIMEN CHECKED FOR CLOTS CONSISTENT WITH PREVIOUS RESULT     Current Facility-Administered Medications  Medication Dose Route Frequency Provider Last Rate Last Dose  . acetaminophen (TYLENOL) suppository 650 mg  650 mg Rectal Q4H PRN Dellia Nims, MD   650 mg at 02/22/15 1858  . acetaminophen (  TYLENOL) tablet 650 mg  650 mg Oral Q4H PRN Loleta Chance, MD   650 mg at 02/22/15 0901  . Ampicillin-Sulbactam (UNASYN) 3 g in sodium chloride 0.9 % 100 mL IVPB  3 g Intravenous Q24H Donalynn Furlong [place], RPH   3 g at 02/28/15 1818  . Darbepoetin Alfa (ARANESP) injection 200 mcg  200 mcg Intravenous Q Thu-HD Ernest Haber, PA-C   200 mcg at 02/28/15 1115  . DULoxetine (CYMBALTA) DR capsule 20 mg  20 mg Oral Daily Collier Salina, MD   20 mg at 03/01/15 1041  . feeding supplement (BOOST / RESOURCE BREEZE) liquid 1 Container  1 Container Oral TID  BM Dale Benkelman, RD   1 Container at 02/27/15 1537  . feeding supplement (PRO-STAT SUGAR FREE 64) liquid 30 mL  30 mL Oral BID Dale Big Wells, RD   30 mL at 02/27/15 1538  . HYDROmorphone (DILAUDID) injection 1 mg  1 mg Intravenous Q4H PRN Juliet Rude, MD   1 mg at 02/27/15 1438  . hydroxychloroquine (PLAQUENIL) tablet 400 mg  400 mg Oral Daily Collier Salina, MD   400 mg at 03/01/15 1041  . Influenza vac split quadrivalent PF (FLUARIX) injection 0.5 mL  0.5 mL Intramuscular Prior to discharge Axel Filler, MD      . metoprolol succinate (TOPROL-XL) 24 hr tablet 25 mg  25 mg Oral Daily Collier Salina, MD   25 mg at 03/01/15 1041  . midodrine (PROAMATINE) tablet 10 mg  10 mg Oral BID WC Axel Filler, MD   10 mg at 03/01/15 0810  . ondansetron (ZOFRAN) tablet 4 mg  4 mg Oral Q6H PRN Corky Sox, MD       Or  . ondansetron [facility]) injection 4 mg  4 mg Intravenous Q6H PRN Corky Sox, MD   4 mg at 02/28/15 1008  . pantoprazole (PROTONIX) EC tablet 40 mg  40 mg Oral Daily Gaynell Face Hiatt, RPH   40 mg at 03/01/15 1041  . pneumococcal 23 valent vaccine (PNU-IMMUNE) injection 0.5 mL  0.5 mL Intramuscular Prior to discharge Axel Filler, MD      . polyethylene glycol (MIRALAX / GLYCOLAX) packet 17 g  17 g Oral Daily PRN Corky Sox, MD      . QUEtiapine (SEROQUEL XR) 24 hr tablet 50 mg  50 mg Oral QHS Norval Gable, MD      . sodium chloride 0.9 % injection 3 mL  3 mL Intravenous Q12H Corky Sox, MD   3 mL at 03/01/15 1043  . warfarin (COUMADIN) tablet 2 mg  2 mg Oral ONCE-1800 Aldine Contes, MD      . Warfarin - Pharmacist Dosing Inpatient   Does not apply q1800 Reginia Naas, RPH   0  at 02/25/15 1800    Musculoskeletal: Strength & Muscle Tone: decreased Gait & Station: unable to stand Patient leans: N/A  Psychiatric Specialty Exam: ROS complaining about stomach pain, generalized weakness, paranoid and delusions but denies shortness of  breath and chest pain No Fever-chills, No Headache, No changes with Vision or hearing, reports vertigo No problems swallowing food or Liquids, No Chest pain, Cough or Shortness of Breath, No Abdominal pain, No Nausea or Vommitting, Bowel movements are regular, No Blood in stool or Urine, No dysuria, No new skin rashes or bruises, No new joints pains-aches,  No new weakness, tingling, numbness in any extremity, No recent weight gain or loss,  No polyuria, polydypsia or polyphagia,  A full 10 point Review of Systems was done, except as stated above, all other Review of Systems were negative.  Blood pressure 134/77, pulse 93, temperature 99.4 F (37.4 C), temperature source Oral, resp. rate 20, height 5' 3"  (1.6 m), weight 81.012 kg (178 lb 9.6 oz), last menstrual period 01/21/2015, SpO2 100 %.Body mass index is 31.65 kg/(m^2).  General Appearance: Guarded  Eye Contact::  Good  Speech:  Clear and Coherent  Volume:  Decreased  Mood:  Anxious and Depressed  Affect:  Constricted and Depressed  Thought Process:  Coherent and Goal Directed  Orientation:  Full (Time, Place, and Person)  Thought Content:  Delusions, Hallucinations: Auditory Visual, Paranoid Ideation and Rumination  Suicidal Thoughts:  No  Homicidal Thoughts:  No  Memory:  Immediate;   Fair Recent;   Fair  Judgement:  Impaired  Insight:  Fair  Psychomotor Activity:  Decreased  Concentration:  Fair  Recall:  Good  Fund of Knowledge:Good  Language: Good  Akathisia:  Negative  Handed:  Right  AIMS (if indicated):     Assets:  Communication Skills Desire for Improvement Financial Resources/Insurance Housing Leisure Time Resilience Social Support Transportation  ADL's:  Impaired  Cognition: WNL  Sleep:      Treatment Plan Summary: Daily contact with patient to assess and evaluate symptoms and progress in treatment and Medication management Discontinue Seroquel which is not helpful and start Zyprexa 5 mg twice  daily for controlling hallucinations and delusions and also held for insomnia Increase Cymbalta 30 mg daily for depression and anxiety Appreciate psychiatric consultation and follow up as clinically required Please contact [phone] or [phone] if needs further assistance  Disposition: No evidence of imminent risk to self or others at present.   Supportive therapy provided about ongoing stressors.  Justinn Welter,JANARDHAHA R. 03/01/2015 11:35 AM

## 2015-03-01 NOTE — Progress Notes (Signed)
Physical Therapy Treatment Patient Details Name: [name] MRN: [id number] DOB: 12-21-1975 Today's Date: 03/01/2015    History of Present Illness Pt is a 39 y.o. F from [place] who has had several recent hospitalizations.  She had a Rt MCA infarction and developed pseudomonal bacteremia from Rt femoral HD line which was treated.  Was doing well in CIR until she acutely developed severe confusion and hallucinations following HD sessions, likely a result of sepsis from Lt ant thigh abscess. Pt's additional PMH includes lupus, fibromyalgia, anemia.    PT Comments    Limited treatment today. Very confused requiring frequent reorientation to task at hand. Paranoid. Refused to stand each time assist was offered, although constantly asking for help stand. Required more physical assist and verbal cues for bed mobility. Patient will continue to benefit from skilled physical therapy services to further improve independence with functional mobility.   Follow Up Recommendations  CIR;Supervision/Assistance - 24 hour     Equipment Recommendations  Wheelchair (measurements PT);Wheelchair cushion (measurements PT)    Recommendations for Other Services Rehab consult     Precautions / Restrictions Precautions Precautions: Fall Precaution Comments: pt with high anxiety Restrictions Weight Bearing Restrictions: No    Mobility  Bed Mobility Overal bed mobility: Needs Assistance Bed Mobility: Rolling Rolling: Mod assist Sidelying to sit: Mod assist Supine to sit: Mod assist     General bed mobility comments: Required more physical assist today with bed mobility. Mod assist with tactile cues to initiate roll and use of rail. Max VC for technique throughout. Several attempts to scoot foward but ultimately required physical assist with bed pad. Pt very confused requiring frequent reorientation to task at hand. Mod assist for LE and trucal support back into bed.  Transfers Overall transfer level:  Needs assistance               General transfer comment: Refused to stand. Attempted by herself, but then refused help. Immediately refused help when attempting to assist.  Ambulation/Gait                 Stairs            Wheelchair Mobility    Modified Rankin (Stroke Patients Only) Modified Rankin (Stroke Patients Only) Pre-Morbid Rankin Score: Moderate disability Modified Rankin: Moderately severe disability     Balance                                    Cognition Arousal/Alertness: Awake/alert Behavior During Therapy: WFL for tasks assessed/performed Overall Cognitive Status: Impaired/Different from baseline Area of Impairment: Safety/judgement;Problem solving;Attention;Awareness;Following commands   Current Attention Level: Focused Memory: Decreased short-term memory Following Commands: Follows one step commands inconsistently Safety/Judgement: Decreased awareness of safety;Decreased awareness of deficits Awareness: Intellectual Problem Solving: Slow processing;Requires verbal cues;Decreased initiation;Difficulty sequencing;Requires tactile cues General Comments: Very confused.    Exercises General Exercises - Lower Extremity Ankle Circles/Pumps: AROM;Both;Supine;10 reps Long Arc Quad: Strengthening;Both;5 reps;Seated Long Arc Quad Limitations: Lt limited 2/2 weakness Hip Flexion/Marching: AROM;Both;10 reps;Seated    General Comments        Pertinent Vitals/Pain Pain Assessment: Faces Faces Pain Scale: Hurts a little bit Pain Location: LLE, ribs Pain Descriptors / Indicators: Sore Pain Intervention(s): Monitored during session;Repositioned    Home Living                      Prior Function  PT Goals (current goals can now be found in the care plan section) Acute Rehab PT Goals Patient Stated Goal: Be able to walk PT Goal Formulation: With patient/family Time For Goal Achievement:  03/13/15 Potential to Achieve Goals: Good Progress towards PT goals: Progressing toward goals    Frequency  Min 3X/week    PT Plan Current plan remains appropriate    Co-evaluation             End of Session   Activity Tolerance: Patient limited by fatigue (Limited by confusion) Patient left: with call bell/phone within reach;in bed;with bed alarm set     Time: [id number] PT Time Calculation (min) (ACUTE ONLY): 31 min  Charges:  $Therapeutic Activity: 23-37 mins                    G CodesEllouise Newer 2015-03-27, 4:46 PM  Camille Bal [place], [place]

## 2015-03-02 LAB — BASIC METABOLIC PANEL
Anion gap: 8 (ref 5–15)
BUN: 17 mg/dL (ref 6–20)
CHLORIDE: 100 mmol/L — AB (ref 101–111)
CO2: 30 mmol/L (ref 22–32)
CREATININE: 4.59 mg/dL — AB (ref 0.44–1.00)
Calcium: 8.2 mg/dL — ABNORMAL LOW (ref 8.9–10.3)
GFR calc Af Amer: 13 mL/min — ABNORMAL LOW (ref >60)
GFR calc non Af Amer: 11 mL/min — ABNORMAL LOW (ref >60)
GLUCOSE: 86 mg/dL (ref 65–99)
Potassium: 3.8 mmol/L (ref 3.5–5.1)
SODIUM: 138 mmol/L (ref 135–145)

## 2015-03-02 LAB — CBC
HCT: 23.9 % — ABNORMAL LOW (ref 36.0–46.0)
HEMOGLOBIN: 7.1 g/dL — AB (ref 12.0–15.0)
MCH: 28 pg (ref 26.0–34.0)
MCHC: 29.7 g/dL — AB (ref 30.0–36.0)
MCV: 94.1 fL (ref 78.0–100.0)
Platelets: 35 10*3/uL — ABNORMAL LOW (ref 150–400)
RBC: 2.54 MIL/uL — ABNORMAL LOW (ref 3.87–5.11)
RDW: 16.7 % — ABNORMAL HIGH (ref 11.5–15.5)
WBC: 11.3 10*3/uL — ABNORMAL HIGH (ref 4.0–10.5)

## 2015-03-02 LAB — PROTIME-INR
INR: 3.54 — ABNORMAL HIGH (ref 0.00–1.49)
PROTHROMBIN TIME: 34.7 s — AB (ref 11.6–15.2)

## 2015-03-02 MED ORDER — LORAZEPAM 2 MG/ML IJ SOLN
INTRAMUSCULAR | Status: AC
  Filled 2015-03-02: qty 1

## 2015-03-02 MED ORDER — ALTEPLASE 2 MG IJ SOLR
2.0000 mg | Freq: Once | INTRAMUSCULAR | Status: DC | PRN
  Filled 2015-03-02: qty 2

## 2015-03-02 MED ORDER — PENTAFLUOROPROP-TETRAFLUOROETH EX AERO: 1.0000 "application " | INHALATION_SPRAY | CUTANEOUS | Status: DC | PRN

## 2015-03-02 MED ORDER — LORAZEPAM 2 MG/ML IJ SOLN
1.0000 mg | Freq: Once | INTRAMUSCULAR | Status: AC
  Administered 2015-03-02: 1 mg via INTRAVENOUS

## 2015-03-02 MED ORDER — LIDOCAINE HCL (PF) 1 % IJ SOLN: 5.0000 mL | INTRAMUSCULAR | Status: DC | PRN

## 2015-03-02 MED ORDER — SODIUM CHLORIDE 0.9 % IV SOLN: 100.0000 mL | INTRAVENOUS | Status: DC | PRN

## 2015-03-02 MED ORDER — LIDOCAINE-PRILOCAINE 2.5-2.5 % EX CREA
1.0000 "application " | TOPICAL_CREAM | CUTANEOUS | Status: DC | PRN
  Filled 2015-03-02: qty 5

## 2015-03-02 MED ORDER — HEPARIN SODIUM (PORCINE) 1000 UNIT/ML DIALYSIS: 2600.0000 [IU] | Freq: Once | INTRAMUSCULAR | Status: DC

## 2015-03-02 MED ORDER — HALOPERIDOL 0.5 MG PO TABS
0.5000 mg | ORAL_TABLET | Freq: Three times a day (TID) | ORAL | Status: DC | PRN
  Filled 2015-03-02: qty 1

## 2015-03-02 MED ORDER — MIDODRINE HCL 5 MG PO TABS
ORAL_TABLET | ORAL | Status: AC
  Filled 2015-03-02: qty 2

## 2015-03-02 MED ORDER — HEPARIN SODIUM (PORCINE) 1000 UNIT/ML DIALYSIS: 1000.0000 [IU] | INTRAMUSCULAR | Status: DC | PRN

## 2015-03-02 NOTE — Progress Notes (Signed)
Subjective: No acute events overnight. Fully alert and oriented today. She slept relatively well last night and has not heard any voices or had any other hallucinations overnight. No other issues at this time.  Objective: Vital signs in last 24 hours: Filed Vitals:   03/02/15 1003 03/02/15 1035 03/02/15 1105 03/02/15 1115  BP: 93/65 92/68 89/54  92/66  Pulse: 101 107 98 95  Temp:      TempSrc:      Resp: 27 31 29 27   Height:      Weight:      SpO2: 100% 100% 100% 100%   Weight change: 10.1 oz (0.287 kg)  Intake/Output Summary (Last 24 hours) at 03/02/15 1127 Last data filed at 03/02/15 1115  Gross per 24 hour  Intake    820 ml  Output    320 ml  Net    500 ml   Gen: Ill-appearing, alert and oriented to person, place, and time HEENT: Oropharynx clear without erythema or exudate.  Neck: No cervical LAD, no thyromegaly or nodules, no JVD noted. CV: Normal rate, regular rhythm, no murmurs, rubs, or gallops Pulmonary: Normal effort, CTA bilaterally, no wheezing, rales, or rhonchi Abdominal: Soft, non-tender, non-distended, without rebound, guarding, or masses Extremities: 3+ edema of the left thigh around graft site, 1+ distal LLE edema, left knee, calf, and foot tender to palpation Skin: No atypical appearing moles. No rashes Psych: Paranoid delusions and auditory hallucinations, with ideas of reference ('hears about them coming to get her on the news'). Also has slightly pressured speech which is new as compared to previous exams. Also endorses depressive symptoms without SI or HI. No other manic symptoms noted at this time. Otherwise no other apparent signs of altered consciousness; patient is non-distractable.  Lab Results: Basic Metabolic Panel:  Recent Labs Lab 02/26/15 0438 02/28/15 0619 03/02/15 0729  NA 138 139 138  K 4.0 4.4 3.8  CL 99* 98* 100*  CO2 29 30 30   GLUCOSE 76 77 86  BUN 36* 28* 17  CREATININE 6.98* 5.69* 4.59*  CALCIUM 8.3* 8.4* 8.2*  PHOS 4.1  4.6  --    Liver Function Tests:  Recent Labs Lab 02/26/15 0438 02/28/15 0619  ALBUMIN 1.5* 1.4*  CBC:  Recent Labs Lab 03/01/15 0915 03/02/15 0755  WBC 9.3 11.3*  HGB 8.9* 7.1*  HCT 29.9* 23.9*  MCV 95.5 94.1  PLT 64* 35*   CardiaCoagulation:  Recent Labs Lab 02/27/15 0618 02/28/15 0619 03/01/15 0915 03/02/15 0755  LABPROT 25.9* 29.8* 30.5* 34.7*  INR 2.40* 2.90* 2.99* 3.54*  Micro Results: Recent Results (from the past 240 hour(s))  Urine culture     Status: None   Collection Time: 02/20/15 12:35 PM  Result Value Ref Range Status   Specimen Description URINE, CATHETERIZED  Final   Special Requests NONE  Final   Culture   Final    >=100,000 COLONIES/mL VANCOMYCIN RESISTANT ENTEROCOCCUS >=100,000 COLONIES/mL ACINETOBACTER CALCOACETICUS/BAUMANNII COMPLEX    Report Status 02/24/2015 FINAL  Final   Organism ID, Bacteria ACINETOBACTER CALCOACETICUS/BAUMANNII COMPLEX  Final   Organism ID, Bacteria VANCOMYCIN RESISTANT ENTEROCOCCUS  Final      Susceptibility   Acinetobacter calcoaceticus/baumannii complex - MIC*    CEFTAZIDIME >=64 RESISTANT Resistant     CEFTRIAXONE >=64 RESISTANT Resistant     CIPROFLOXACIN >=4 RESISTANT Resistant     GENTAMICIN 2 SENSITIVE Sensitive     IMIPENEM 2 SENSITIVE Sensitive     PIP/TAZO 32 INTERMEDIATE Intermediate     TRIMETH/SULFA >=320  RESISTANT Resistant     AMPICILLIN/SULBACTAM 4 SENSITIVE Sensitive     * >=100,000 COLONIES/mL ACINETOBACTER CALCOACETICUS/BAUMANNII COMPLEX   Vancomycin resistant enterococcus - MIC*    AMPICILLIN <=2 SENSITIVE Sensitive     LEVOFLOXACIN >=8 RESISTANT Resistant     NITROFURANTOIN <=16 SENSITIVE Sensitive     VANCOMYCIN >=32 RESISTANT Resistant     LINEZOLID 2 SENSITIVE Sensitive     * >=100,000 COLONIES/mL VANCOMYCIN RESISTANT ENTEROCOCCUS  Culture, blood (routine x 2)     Status: None   Collection Time: 02/20/15  3:30 PM  Result Value Ref Range Status   Specimen Description BLOOD VENOUS   Final   Special Requests BOTTLES DRAWN AEROBIC AND ANAEROBIC 10ML  Final   Culture NO GROWTH 5 DAYS  Final   Report Status 02/25/2015 FINAL  Final  Culture, blood (routine x 2)     Status: None   Collection Time: 02/20/15  4:16 PM  Result Value Ref Range Status   Specimen Description BLOOD ARTERIAL LINE  Final   Special Requests BOTTLES DRAWN AEROBIC AND ANAEROBIC 10ML  Final   Culture NO GROWTH 5 DAYS  Final   Report Status 02/25/2015 FINAL  Final  Culture, blood (routine x 2)     Status: None   Collection Time: 02/21/15  8:36 PM  Result Value Ref Range Status   Specimen Description BLOOD RIGHT HAND  Final   Special Requests IN PEDIATRIC BOTTLE 3CC  Final   Culture NO GROWTH 5 DAYS  Final   Report Status 02/26/2015 FINAL  Final  Culture, blood (routine x 2)     Status: None   Collection Time: 02/21/15  9:21 PM  Result Value Ref Range Status   Specimen Description BLOOD RIGHT THUMB  Final   Special Requests IN PEDIATRIC BOTTLE 1.5CC  Final   Culture NO GROWTH 5 DAYS  Final   Report Status 02/26/2015 FINAL  Final  Culture, blood (routine x 2)     Status: None (Preliminary result)   Collection Time: 02/27/15  3:41 PM  Result Value Ref Range Status   Specimen Description BLOOD RIGHT HAND  Final   Special Requests IN PEDIATRIC BOTTLE  2CC  Final   Culture NO GROWTH 2 DAYS  Final   Report Status PENDING  Incomplete  Culture, blood (routine x 2)     Status: None (Preliminary result)   Collection Time: 02/27/15  5:10 PM  Result Value Ref Range Status   Specimen Description BLOOD RIGHT HAND  Final   Special Requests IN PEDIATRIC BOTTLE  3CC  Final   Culture NO GROWTH 2 DAYS  Final   Report Status PENDING  Incomplete   Assessment/Plan: Principal Problem:   Psychosis Active Problems:   Lupus nephritis ([place])   ESRD (end stage renal disease) on dialysis (HCC)   Systemic lupus (HCC)   Acute encephalopathy   Lower abdominal pain   Knee pain   Sepsis secondary to UTI (HCC)   Pain  and swelling of left lower leg   Pressure ulcer  Acute psychotic symptoms: Symptoms worsened again over past few days with more paranoid delusion and auditory hallucination. Possibly 2/2 UTI, but also occuring in setting of acute depressive symptoms so may be major depression with psychotic symptoms. Lupus psychosis is yet another possible cause, and may consider steroid course if symptoms persist. She remains afebrile with stable vital signs. 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 She  is eager to "prove herself" on PT/OT and make progress towards getting home.  -qAM CBC, renal labs -Continue Unasyn 11/25>> -Tylenol prn for fever -Dilaudid 1 mg IV Q4H PRN pain -Switched to olanzapine 5mg  BID, also haldol 0.5mg  PO PRN for psychosis -Consulted psychiatry, appreciate rec's -F/u blood and urine cultures -Consider dsDNA and complement levels as well as steroid course if continues to have psychotic symptoms  Left leg edema: Negative for clot in LLE on initial read. Thigh fluid collection redemonstrated. Unclear if her pain is related simply to worsened edema of the leg. It is neurovascularly intact on exam and the strength seems more limited by pain and her residual deficit from stroke -[place] for LEDVT -Continuing abx as above  ESRD on HD: Back on her outpatient TTS schedule. -Midodrine 10mg  for hypotension -Nephrology following, appreciate recs -HD per nephrology  H/o recent R. MCA Embolic CVA: Needs ongoing PT/OT work, and is likely a candidate for CIR again due to loss of her previous progress with this medical admission and infection -Daily PT/INR -Coumadin per pharm -Still needs daily PT/OT treatment  Depression: Very tearful, with flat affect and depressed mood. She was started on Celexa 10mg  at CIR for about 1 week without adverse effect. 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 Anticipated discharge will depend on treatment plan after further evaluation, approximately 1-3 days.   LOS: 11 days   Norval Gable, MD 03/02/2015, 11:27 AM

## 2015-03-02 NOTE — Procedures (Addendum)
Still hallucinating.  Seen by pyschiatry and started on olanzapine.  Could use something perhaps for acute anxiety/ psychosis while on dialysis, will d/w primary team. She does have a prolonged QT interval on her last EKG from 3 days ago , so Haldol/ typical antipsychotics might be problematic.  Also would consider trial of po steroids for lupus psychosis , especially if she still has problems on the antipsychotics.  Have d/w primary team.   I was present at this dialysis session, have reviewed the session itself and made  appropriate changes Kelly Splinter MD [place] pager [phone]    cell [phone] 03/02/2015, 9:45 AM

## 2015-03-02 NOTE — Progress Notes (Signed)
ANTICOAGULATION CONSULT NOTE - Follow Up Consult  Pharmacy Consult for Coumadin Indication: stroke and hypercoaguable state  Allergies  Allergen Reactions  . Food Swelling    Red peppers    Patient Measurements: Height: 5\' 3"  (160 cm) Weight: 180 lb 5.4 oz (81.8 kg) IBW/kg (Calculated) : 52.4  Vital Signs: Temp: 98 F (36.7 C) (12/03 1503) Temp Source: Oral (12/03 1503) BP: 102/56 mmHg (12/03 1503) Pulse Rate: 95 (12/03 1503)  Labs:  Recent Labs  02/28/15 0619 03/01/15 0915 03/02/15 0729 03/02/15 0755  HGB 8.4* 8.9*  --  7.1*  HCT 28.5* 29.9*  --  23.9*  PLT 97* 64*  --  35*  LABPROT 29.8* 30.5*  --  34.7*  INR 2.90* 2.99*  --  3.54*  CREATININE 5.69*  --  4.59*  --     Estimated Creatinine Clearance: 16.7 mL/min (by C-G formula based on Cr of 4.59).  Assessment:  39 yr old female with ESRD on Coumadin for history of SLE, stroke, and hypercoagulable state. She had been on Coumadin 5 mg daily from 11/16-11/22/16, with gradual rise to therapeutic INR after 5 days. Coumadin held/reversed on 11/23, with possible need for lumbar puncture or other procedure.  Coumadin resumed on 11/28, as no procedures planned.     INR up to 3.54.  Hgb down to 7.1 and latelet count trended down to 35K. No bleeding reported.   .  Goal of Therapy:  INR 2-3 Monitor platelets by anticoagulation protocol: Yes   Plan:   No Coumadin today.  Continue daily PT/INR.  Watch platelet count.  Follow up for any bleeding.  Arty Baumgartner, [place] Pager: [phone] 03/02/2015,4:34 PM

## 2015-03-03 DIAGNOSIS — G9340 Encephalopathy, unspecified: Secondary | ICD-10-CM | POA: Insufficient documentation

## 2015-03-03 LAB — CBC
HEMATOCRIT: 23.7 % — AB (ref 36.0–46.0)
Hemoglobin: 7 g/dL — ABNORMAL LOW (ref 12.0–15.0)
MCH: 27.8 pg (ref 26.0–34.0)
MCHC: 29.5 g/dL — ABNORMAL LOW (ref 30.0–36.0)
MCV: 94 fL (ref 78.0–100.0)
Platelets: 7 10*3/uL — CL (ref 150–400)
RBC: 2.52 MIL/uL — AB (ref 3.87–5.11)
RDW: 16.6 % — AB (ref 11.5–15.5)
WBC: 8.9 10*3/uL (ref 4.0–10.5)

## 2015-03-03 LAB — PROTIME-INR
INR: 3.24 — AB (ref 0.00–1.49)
Prothrombin Time: 32.5 seconds — ABNORMAL HIGH (ref 11.6–15.2)

## 2015-03-03 MED ORDER — OXYCODONE-ACETAMINOPHEN 5-325 MG PO TABS
1.0000 | ORAL_TABLET | Freq: Four times a day (QID) | ORAL | Status: DC | PRN
  Administered 2015-03-06 – 2015-03-20 (×8): 1 via ORAL
  Filled 2015-03-03 (×8): qty 1

## 2015-03-03 NOTE — Progress Notes (Signed)
Subjective: No acute events overnight. Doing well this morning. Denies hallucinations. Wants to go home but willing to consider CIR. Denies CP or dyspnea.  Objective: Vital signs in last 24 hours: Filed Vitals:   03/02/15 1309 03/02/15 1503 03/02/15 2055 03/03/15 0632  BP: 112/63 102/56 108/72 105/70  Pulse: 101 95 102 101  Temp: 98.6 F (37 C) 98 F (36.7 C) 98.2 F (36.8 C) 98.1 F (36.7 C)  TempSrc: Oral Oral Oral Oral  Resp: 22 22 20 19   Height:      Weight:      SpO2: 100% 100% 98% 100%   Weight change: 1 lb 1.6 oz (0.5 kg)  Intake/Output Summary (Last 24 hours) at 03/03/15 0737 Last data filed at 03/03/15 0600  Gross per 24 hour  Intake    100 ml  Output    319 ml  Net   -219 ml   General Apperance: NAD HEENT: Normocephalic, atraumatic, PERRL, EOMI, anicteric sclera Neck: Supple, trachea midline Lungs: Clear to auscultation bilaterally. No wheezes, rhonchi or rales. Heart: Regular rate and rhythm Abdomen: Soft, nontender, nondistended, no rebound/guarding Extremities: Warm and well perfused, 2+ edema of the left thigh around graft site, 1+ distal LLE edema Pulses: 1+ throughout Skin: No rashes or lesions Neurologic: Alert and oriented x 3. No gross deficits. Psych: Flat affect but appropriate.  Lab Results: Basic Metabolic Panel:  Recent Labs Lab 02/26/15 0438 02/28/15 0619 03/02/15 0729  NA 138 139 138  K 4.0 4.4 3.8  CL 99* 98* 100*  CO2 29 30 30   GLUCOSE 76 77 86  BUN 36* 28* 17  CREATININE 6.98* 5.69* 4.59*  CALCIUM 8.3* 8.4* 8.2*  PHOS 4.1 4.6  --    Liver Function Tests:  Recent Labs Lab 02/26/15 0438 02/28/15 0619  ALBUMIN 1.5* 1.4*   CBC:  Recent Labs Lab 03/01/15 0915 03/02/15 0755  WBC 9.3 11.3*  HGB 8.9* 7.1*  HCT 29.9* 23.9*  MCV 95.5 94.1  PLT 64* 35*   Coagulation:  Recent Labs Lab 02/27/15 0618 02/28/15 0619 03/01/15 0915 03/02/15 0755  LABPROT 25.9* 29.8* 30.5* 34.7*  INR 2.40* 2.90* 2.99* 3.54*     Micro Results: Recent Results (from the past 240 hour(s))  Culture, blood (routine x 2)     Status: None   Collection Time: 02/21/15  8:36 PM  Result Value Ref Range Status   Specimen Description BLOOD RIGHT HAND  Final   Special Requests IN PEDIATRIC BOTTLE 3CC  Final   Culture NO GROWTH 5 DAYS  Final   Report Status 02/26/2015 FINAL  Final  Culture, blood (routine x 2)     Status: None   Collection Time: 02/21/15  9:21 PM  Result Value Ref Range Status   Specimen Description BLOOD RIGHT THUMB  Final   Special Requests IN PEDIATRIC BOTTLE 1.5CC  Final   Culture NO GROWTH 5 DAYS  Final   Report Status 02/26/2015 FINAL  Final  Culture, blood (routine x 2)     Status: None (Preliminary result)   Collection Time: 02/27/15  3:41 PM  Result Value Ref Range Status   Specimen Description BLOOD RIGHT HAND  Final   Special Requests IN PEDIATRIC BOTTLE  2CC  Final   Culture NO GROWTH 3 DAYS  Final   Report Status PENDING  Incomplete  Culture, blood (routine x 2)     Status: None (Preliminary result)   Collection Time: 02/27/15  5:10 PM  Result Value Ref Range Status  Specimen Description BLOOD RIGHT HAND  Final   Special Requests IN PEDIATRIC BOTTLE  3CC  Final   Culture NO GROWTH 3 DAYS  Final   Report Status PENDING  Incomplete   Studies/Results: No results found.   Medications: I have reviewed the patient's current medications. Scheduled Meds: . ampicillin-sulbactam (UNASYN) IV  3 g Intravenous Q24H  . B-complex with vitamin C  1 tablet Oral Daily  . Darbepoetin Alfa  200 mcg Intravenous Q Thu-HD  . DULoxetine  30 mg Oral Daily  . feeding supplement  1 Container Oral TID BM  . feeding supplement (PRO-STAT SUGAR FREE 64)  30 mL Oral BID  . hydroxychloroquine  400 mg Oral Daily  . metoprolol succinate  25 mg Oral Daily  . midodrine  10 mg Oral BID WC  . multivitamin  1 tablet Oral BID  . OLANZapine zydis  5 mg Oral BID PC  . pantoprazole  40 mg Oral Daily  . sodium  chloride  3 mL Intravenous Q12H  . 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   Acute psychosis, Depression: She had acute confusion and hallucinations secondary UTI. Her symptoms improved but worsened 12/9. 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 Low suspicion for Lupus cerebritis. She remains afebrile. Repeat blood cultures on 11/30 NGTD. -Continue Unasyn to complete 14 day course from 11/25 (end date: 12/9) -Continue olanzapine 5mg  BID, haldol 0.5mg  PO PRN for agitation -Monitor QTc -Continue duloxetine 30mg  QD. -Consulted psychiatry, appreciate recs  ESRD on HD, s/p Left thigh AVG:AVG placed 01/04/2015. Back on her outpatient TTS schedule. She has some left leg edema likely 2/2 AVG. Venous duplex with post surgical changes and no DVT. -Midodrine 10mg  BID -Nephrology following, appreciate recs -HD per nephrology -Percocet 5/325mg  q 6 hours prn pain  Acute on chronic anemia: Hgb down to 7.1 on 12/3 from 8.9. -Repeat CBC. Transfuse prn for hgb <7.  Hx recent R. MCA Embolic CVA: PT recommending CIR. -Daily PT/INR -Coumadin per pharm -Continue PT  SLE: Continue Plaquenil Hx SVT/Tachycardia: Continue Toprol-XL 25mg   FEN: Renal VTE ppx: Coumadin  FULL CODE  Dispo: Awaiting evaluation by CIR.  Anticipated discharge in approximately 1-2 day(s).   The patient does have a current PCP Lin Landsman, MD) and does not need an [facility] hospital follow-up appointment after discharge.  The  patient does not have transportation limitations that hinder transportation to clinic appointments.  Services Needed at time of discharge: Y = Yes, Blank = No PT:   OT:   RN:   Equipment:   Other:     LOS: 12 days   Milagros Loll, MD 03/03/2015, 7:37 AM

## 2015-03-03 NOTE — Progress Notes (Signed)
  [name] Progress Note   Subjective: is getting more sleep now and does not have any hallucinations at this time  Filed Vitals:   03/02/15 1503 03/02/15 2055 03/03/15 0632 03/03/15 0757  BP: 102/56 108/72 105/70 108/64  Pulse: 95 102 101 97  Temp: 98 F (36.7 C) 98.2 F (36.8 C) 98.1 F (36.7 C) 98.5 F (36.9 C)  TempSrc: Oral Oral Oral Oral  Resp: 22 20 19 20   Height:      Weight:      SpO2: 100% 98% 100% 97%   . ampicillin-sulbactam (UNASYN) IV  3 g Intravenous Q24H  . B-complex with vitamin C  1 tablet Oral Daily  . Darbepoetin Alfa  200 mcg Intravenous Q Thu-HD  . DULoxetine  30 mg Oral Daily  . feeding supplement  1 Container Oral TID BM  . feeding supplement (PRO-STAT SUGAR FREE 64)  30 mL Oral BID  . hydroxychloroquine  400 mg Oral Daily  . metoprolol succinate  25 mg Oral Daily  . midodrine  10 mg Oral BID WC  . multivitamin  1 tablet Oral BID  . OLANZapine zydis  5 mg Oral BID PC  . pantoprazole  40 mg Oral Daily  . sodium chloride  3 mL Intravenous Q12H  . Warfarin - Pharmacist Dosing Inpatient   Does not apply q1800     acetaminophen, acetaminophen, haloperidol, Influenza vac split quadrivalent PF, ondansetron **OR** ondansetron (ZOFRAN) IV, oxyCODONE-acetaminophen, pneumococcal 23 valent vaccine, polyethylene glycol   Exam: WD [name] in NAD No jvd Chest clear bilat RRR no mrg Abd obese ntnd Ext stable 2+ L thigh edema, no fluctuance or drainage AVG L thigh +bruit Neuro gen weak, ox 3, confused  [facility] TTS 4 hr   87.5kg 2/2 bath L thigh AVG Hep 2600 Aranesp 200/week      Assessment: 1 AMS / acute psychosis - zyprexa appears to be working, much calmer now 2 Acinetobacter UTI on Unasyn 3 ESRD TTS HD L thigh graft 4 Recent cath sepsis (pseudomonas) 5 SLE plaquenil 6 SP CVA/ endocarditis ([place])/ hypercoag- on coumadin 7 Debil 8 SVT on MTP 9 Hypotens on midodrine 10 Anemia max darbe, hb worse 11 Vol 6 kg under dry wt ,  euvolemic on exam 11 Malnutrition low alb 1.3 mvi/ b complex  Plan - cont HD tts, prob needs Fe, please f/u fe/ tibc ordered for Mon am   Kelly Splinter MD [facility] pager [phone]    cell [phone] 03/03/2015, 9:33 AM    Recent Labs Lab 02/26/15 0438 02/28/15 0619 03/02/15 0729  NA 138 139 138  K 4.0 4.4 3.8  CL 99* 98* 100*  CO2 29 30 30   GLUCOSE 76 77 86  BUN 36* 28* 17  CREATININE 6.98* 5.69* 4.59*  CALCIUM 8.3* 8.4* 8.2*  PHOS 4.1 4.6  --     Recent Labs Lab 02/26/15 0438 02/28/15 0619  ALBUMIN 1.5* 1.4*    Recent Labs Lab 02/28/15 0619 03/01/15 0915 03/02/15 0755  WBC 8.4 9.3 11.3*  HGB 8.4* 8.9* 7.1*  HCT 28.5* 29.9* 23.9*  MCV 95.0 95.5 94.1  PLT 97* 64* 35*

## 2015-03-03 NOTE — Progress Notes (Signed)
ANTICOAGULATION CONSULT NOTE - Follow Up Consult  Pharmacy Consult for Coumadin Indication: stroke and hypercoaguable state  Allergies  Allergen Reactions  . Food Swelling    Red peppers    Patient Measurements: Height: 5\' 3"  (160 cm) Weight: 180 lb 5.4 oz (81.8 kg) IBW/kg (Calculated) : 52.4  Vital Signs: Temp: 98.5 F (36.9 C) (12/04 0757) Temp Source: Oral (12/04 0757) BP: 108/64 mmHg (12/04 0757) Pulse Rate: 97 (12/04 0757)  Labs:  Recent Labs  03/01/15 0915 03/02/15 0729 03/02/15 0755  HGB 8.9*  --  7.1*  HCT 29.9*  --  23.9*  PLT 64*  --  35*  LABPROT 30.5*  --  34.7*  INR 2.99*  --  3.54*  CREATININE  --  4.59*  --     Estimated Creatinine Clearance: 16.7 mL/min (by C-G formula based on Cr of 4.59).  Assessment:  39 yr old female with ESRD on Coumadin for history of SLE, stroke, and hypercoagulable state. She had been on Coumadin 5 mg daily from 11/16-11/22/16, with gradual rise to therapeutic INR after 5 days. Coumadin held/reversed on 11/23, with possible need for lumbar puncture or other procedure.  Coumadin resumed on 11/28, as no procedures planned.     INR up to 3.54 on 12/3;  Hgb down to 7.1 and latelet count trended down to 35K. No bleeding reported.   Unable to get labs yet today, quantity insufficient.  Dr. Randell Patient informed, for one more attempt at [facility] and PT/INR today.   .  Goal of Therapy:  INR 2-3 Monitor platelets by anticoagulation protocol: Yes   Plan:   No Coumadin today.  Continue daily PT/INR.  Watch platelet count.  Follow up for any bleeding.  Arty Baumgartner, [place] Pager: [phone] 03/03/2015,3:02 PM  Addendum:   Able to get labs ~6:20 pm.   INR 3.24.  Hgb 7.0, Platelet count down to 7.   Hold Coumadin again today.   Continue daily PT/INR.  Consuello Masse, RPh  7:52 PM

## 2015-03-04 DIAGNOSIS — D696 Thrombocytopenia, unspecified: Secondary | ICD-10-CM | POA: Insufficient documentation

## 2015-03-04 DIAGNOSIS — D62 Acute posthemorrhagic anemia: Secondary | ICD-10-CM

## 2015-03-04 LAB — CULTURE, BLOOD (ROUTINE X 2)
CULTURE: NO GROWTH
Culture: NO GROWTH

## 2015-03-04 LAB — CBC
HCT: 20.3 % — ABNORMAL LOW (ref 36.0–46.0)
HCT: 31 % — ABNORMAL LOW (ref 36.0–46.0)
HEMOGLOBIN: 6.1 g/dL — AB (ref 12.0–15.0)
Hemoglobin: 10.4 g/dL — ABNORMAL LOW (ref 12.0–15.0)
MCH: 28.1 pg (ref 26.0–34.0)
MCH: 29.7 pg (ref 26.0–34.0)
MCHC: 30 g/dL (ref 30.0–36.0)
MCHC: 33.5 g/dL (ref 30.0–36.0)
MCV: 93.5 fL (ref 78.0–100.0)
MCV: 88.6 fL (ref 78.0–100.0)
Platelets: <5 10*3/uL — CL (ref 150–400)
RBC: 2.17 MIL/uL — AB (ref 3.87–5.11)
RBC: 3.5 MIL/uL — ABNORMAL LOW (ref 3.87–5.11)
RDW: 16.4 % — ABNORMAL HIGH (ref 11.5–15.5)
RDW: 16.1 % — AB (ref 11.5–15.5)
WBC: 8.8 10*3/uL (ref 4.0–10.5)
WBC: 9.2 10*3/uL (ref 4.0–10.5)

## 2015-03-04 LAB — BASIC METABOLIC PANEL
ANION GAP: 9 (ref 5–15)
BUN: 8 mg/dL (ref 6–20)
CHLORIDE: 98 mmol/L — AB (ref 101–111)
CO2: 30 mmol/L (ref 22–32)
Calcium: 7.5 mg/dL — ABNORMAL LOW (ref 8.9–10.3)
Creatinine, Ser: 2.49 mg/dL — ABNORMAL HIGH (ref 0.44–1.00)
GFR calc Af Amer: 27 mL/min — ABNORMAL LOW (ref >60)
GFR, EST NON AFRICAN AMERICAN: 23 mL/min — AB (ref >60)
Glucose, Bld: 66 mg/dL (ref 65–99)
POTASSIUM: 3.1 mmol/L — AB (ref 3.5–5.1)
SODIUM: 137 mmol/L (ref 135–145)

## 2015-03-04 LAB — MRSA PCR SCREENING: MRSA BY PCR: NEGATIVE

## 2015-03-04 LAB — TRANSFERRIN

## 2015-03-04 LAB — PROTIME-INR
INR: 3.6 — AB (ref 0.00–1.49)
PROTHROMBIN TIME: 35.1 s — AB (ref 11.6–15.2)

## 2015-03-04 LAB — TSH: TSH: 2.224 u[IU]/mL (ref 0.350–4.500)

## 2015-03-04 LAB — RETICULOCYTES
RBC.: 2.21 MIL/uL — ABNORMAL LOW (ref 3.87–5.11)
Retic Count, Absolute: 30.9 10*3/uL (ref 19.0–186.0)
Retic Ct Pct: 1.4 % (ref 0.4–3.1)

## 2015-03-04 LAB — PREPARE RBC (CROSSMATCH)

## 2015-03-04 LAB — LACTATE DEHYDROGENASE: LDH: 221 U/L — AB (ref 98–192)

## 2015-03-04 LAB — TECHNOLOGIST SMEAR REVIEW

## 2015-03-04 LAB — SAVE SMEAR

## 2015-03-04 MED ORDER — LORAZEPAM 2 MG/ML IJ SOLN
INTRAMUSCULAR | Status: AC
  Administered 2015-03-04: 1 mg via INTRAVENOUS
  Filled 2015-03-04: qty 1

## 2015-03-04 MED ORDER — PREDNISONE 50 MG PO TABS
60.0000 mg | ORAL_TABLET | Freq: Every day | ORAL | Status: DC
  Administered 2015-03-05: 60 mg via ORAL
  Filled 2015-03-04: qty 1

## 2015-03-04 MED ORDER — OLANZAPINE 5 MG PO TBDP
10.0000 mg | ORAL_TABLET | Freq: Two times a day (BID) | ORAL | Status: DC
  Administered 2015-03-05 – 2015-03-22 (×28): 10 mg via ORAL
  Filled 2015-03-04 (×2): qty 2
  Filled 2015-03-04: qty 1
  Filled 2015-03-04 (×3): qty 2
  Filled 2015-03-04: qty 1
  Filled 2015-03-04 (×2): qty 2
  Filled 2015-03-04: qty 1
  Filled 2015-03-04: qty 2
  Filled 2015-03-04: qty 1
  Filled 2015-03-04: qty 2
  Filled 2015-03-04: qty 1
  Filled 2015-03-04 (×2): qty 2
  Filled 2015-03-04 (×2): qty 1
  Filled 2015-03-04 (×2): qty 2
  Filled 2015-03-04 (×4): qty 1
  Filled 2015-03-04 (×8): qty 2
  Filled 2015-03-04 (×2): qty 1
  Filled 2015-03-04 (×9): qty 2
  Filled 2015-03-04: qty 1

## 2015-03-04 MED ORDER — SODIUM CHLORIDE 0.9 % IV SOLN: Freq: Once | INTRAVENOUS | Status: DC

## 2015-03-04 MED ORDER — DULOXETINE HCL 20 MG PO CPEP
40.0000 mg | ORAL_CAPSULE | Freq: Every day | ORAL | Status: DC
  Administered 2015-03-05 – 2015-03-22 (×13): 40 mg via ORAL
  Filled 2015-03-04 (×18): qty 2

## 2015-03-04 MED ORDER — LORAZEPAM 2 MG/ML IJ SOLN
1.0000 mg | Freq: Once | INTRAMUSCULAR | Status: AC
  Administered 2015-03-04: 1 mg via INTRAVENOUS

## 2015-03-04 MED ORDER — SODIUM CHLORIDE 0.9 % IV SOLN
Freq: Once | INTRAVENOUS | Status: DC
Start: 2015-03-04 — End: 2015-03-10

## 2015-03-04 NOTE — Clinical Documentation Improvement (Signed)
Internal Medicine  Can the diagnosis of anemia be further specified? Please update your documentation within the medical record to reflect your response to this query. Do not document in BPA drop down box; enter findings in progress note. Thank you!   Iron deficiency Anemia  Nutritional anemia, including the nutrition or mineral deficits  Chronic Blood Loss Anemia, including the suspected or known cause  Acute on Chronic Blood Loss Anemia  Anemia of chronic disease, including the associated chronic disease state  Other  Clinically Undetermined  Supporting Information:  H&H (13.7 / 48.6) on day of admission to (7.5 / 25) 03/03/15  ESRD; dependence on hemodialysis  Please exercise your independent, professional judgment when responding. A specific answer is not anticipated or expected.  Thank You,  Janay Canan T Taj Nevins RN, BSN, CCDS Health Information Management [employer] [phone] ; Cell: [phone] 

## 2015-03-04 NOTE — Progress Notes (Signed)
Subjective: No acute events overnight. Denies hallucinations. Pt endorses shortness of breath and intermittent palpitations since yesterday afternoon. Denies chest pain, cough, weakness/lightheadedness, abdominal pain, any changes in urination or bowel function. She still endorses L leg pain which is unchanged from previous.  Objective: Vital signs in last 24 hours: Filed Vitals:   03/03/15 0757 03/03/15 1517 03/03/15 2212 03/04/15 0539  BP: 108/64 105/51 114/70 109/73  Pulse: 97 101 106 103  Temp: 98.5 F (36.9 C) 98.6 F (37 C) 99.6 F (37.6 C) 99 F (37.2 C)  TempSrc: Oral Oral Oral Oral  Resp: 20 18 20 16   Height:      Weight:   180 lb 12.4 oz (82 kg)   SpO2: 97% 97% 98% 97%   Weight change: 7.1 oz (0.2 kg)  Intake/Output Summary (Last 24 hours) at 03/04/15 0940 Last data filed at 03/04/15 0600  Gross per 24 hour  Intake    220 ml  Output      0 ml  Net    220 ml   General Apperance: NAD HEENT: Normocephalic, atraumatic, PERRL, EOMI, anicteric sclera Neck: Supple, trachea midline Lungs: Clear to auscultation bilaterally. No wheezes, rhonchi or rales. Heart: Regular rate and rhythm Abdomen: Soft, nontender, nondistended, no rebound/guarding Extremities: Warm and well perfused, 2+ edema of the left thigh around graft site, 1+ distal LLE edema Pulses: 1+ throughout Skin: No rashes or lesions Neurologic: Alert and oriented x 3. No gross deficits. Psych: Flat affect but appropriate.  Lab Results: Basic Metabolic Panel:  Recent Labs Lab 02/26/15 0438 02/28/15 0619 03/02/15 0729  NA 138 139 138  K 4.0 4.4 3.8  CL 99* 98* 100*  CO2 29 30 30   GLUCOSE 76 77 86  BUN 36* 28* 17  CREATININE 6.98* 5.69* 4.59*  CALCIUM 8.3* 8.4* 8.2*  PHOS 4.1 4.6  --    Liver Function Tests:  Recent Labs Lab 02/26/15 0438 02/28/15 0619  ALBUMIN 1.5* 1.4*   CBC:  Recent Labs Lab 03/03/15 1822 03/04/15 0542  WBC 8.9 8.8  HGB 7.0* 6.1*  HCT 23.7* 20.3*  MCV 94.0 93.5   PLT 7* PENDING   Coagulation:  Recent Labs Lab 03/01/15 0915 03/02/15 0755 03/03/15 1822 03/04/15 0542  LABPROT 30.5* 34.7* 32.5* 35.1*  INR 2.99* 3.54* 3.24* 3.60*    Micro Results: Recent Results (from the past 240 hour(s))  Culture, blood (routine x 2)     Status: None (Preliminary result)   Collection Time: 02/27/15  3:41 PM  Result Value Ref Range Status   Specimen Description BLOOD RIGHT HAND  Final   Special Requests IN PEDIATRIC BOTTLE  2CC  Final   Culture NO GROWTH 4 DAYS  Final   Report Status PENDING  Incomplete  Culture, blood (routine x 2)     Status: None (Preliminary result)   Collection Time: 02/27/15  5:10 PM  Result Value Ref Range Status   Specimen Description BLOOD RIGHT HAND  Final   Special Requests IN PEDIATRIC BOTTLE  3CC  Final   Culture NO GROWTH 4 DAYS  Final   Report Status PENDING  Incomplete   Assessment/Plan: 39 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   Acute psychosis, Depression: She had acute confusion and hallucinations secondary UTI. Her symptoms improved but worsened 12/9. 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 Low suspicion for Lupus  cerebritis. She remains afebrile. Repeat blood cultures on 11/30 NGTD. These symptoms continue to resolve with olanzapine. -Will d/c Unasyn today 2/2 thrombocytopenia - received 10 days of abx (initially to complete 14 day course from 11/25 (end date: 12/9)) -Continue olanzapine 5mg  BID, haldol 0.5mg  PO PRN for agitation -Monitor QTc -Continue duloxetine 30mg  QD. -Consulted psychiatry, appreciate recs  Acute on chronic anemia and thrombocytopenia: Hgb down to 7.1 on 12/3 from 8.9. Further decreased to 6.1 on 12/5 with Plt count trending down to 7 as well. No signs or symptoms of acute bleeding. Pt is symptomatic from the  anemia with shortness of breath and palpitations. Most likely 2/2 lupus flare as pt has history of similar episodes but Unasyn is also a potential precipitant. 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 -Transfuse 2U PRBCs and 1U plts now -Check dsDNA, complement levels, retic and LDH now -Post-transfusion CBC -Coumadin held today per pharm  ESRD on HD, s/p Left thigh AVG:AVG placed 01/04/2015. Back on her outpatient TTS schedule. She has some left leg edema likely 2/2 AVG. Venous duplex with post surgical changes and no DVT. -Midodrine 10mg  BID -Nephrology following, appreciate recs -HD per nephrology -Percocet 5/325mg  q 6 hours prn pain  Hx recent R. MCA Embolic CVA: PT recommending CIR. -Daily PT/INR -Coumadin per pharm -Continue PT  SLE: Continue Plaquenil Hx SVT/Tachycardia: Continue Toprol-XL 25mg   FEN: Renal VTE ppx: Coumadin  FULL CODE  Dispo: Awaiting evaluation by CIR.  Anticipated discharge in approximately 1-2 day(s).   The patient does have a current PCP Lin Landsman, MD) and does not need an [facility] hospital follow-up appointment after discharge.  The patient does not have transportation limitations that hinder transportation to clinic appointments.   LOS: 13 days   Norval Gable, MD 03/04/2015, 9:40 AM

## 2015-03-04 NOTE — Procedures (Signed)
I have seen and examined this patient and agree with the plan of care . No issues seen on dialysis  [facility] W 03/04/2015, 4:46 PM

## 2015-03-04 NOTE — Progress Notes (Signed)
CRITICAL VALUE ALERT  Critical value received:  Platelets 7  Date of notification:  03/03/2015  Time of notification:  2114  Critical value read back:Yes.    Nurse who received alert:  Earleen Reaper RN  MD notified (1st page):  Dr Chauncey Cruel  Time of first page:  2114  MD notified (2nd page):  Time of second page:  Responding MD:  Dr. Chauncey Cruel  Time MD responded:  2117  No new orders received at this time.  Earleen Reaper RN-BC, [employer]

## 2015-03-04 NOTE — Progress Notes (Signed)
Noted new order for Inpt rehab consult. As per Admissions Coordinator note 11/23, discussed with Dr. Letta Pate and social worker, Ovidio Kin, that pt not in need of an inpt rehab readmission. Pt was set for d/c on 11/22 with pt and family education completed and arrangements for d/c home when became acutely ill. If pt and family feel further rehab is needed, would consider SNF rehab. [phone]

## 2015-03-04 NOTE — Progress Notes (Signed)
CRITICAL VALUE ALERT  Critical value received:  Hgb = 6.1  Date of notification:  03-04-2015  Time of notification:  07:51  Critical value read back:Yes.    Nurse who received alert:  Genelle Bal, RN  MD notified (1st page):  Dr. Merrilyn Puma  Time of first page:  07:52  MD notified (2nd page):  Time of second page:  Responding MD:  Dr. Merrilyn Puma  Time MD responded:  08:37

## 2015-03-04 NOTE — Progress Notes (Signed)
PT Cancellation Note  Patient Details Name: [name] MRN: [id number] DOB: [birth year]   Cancelled Treatment:    Reason Eval/Treat Not Completed: Patient not medically ready. RN deferred PT as pt's HGB is 6.1. Pt to received 2 units of blood. Will re-attempt as able.   Kingsley Callander 03/04/2015, 9:49 AM   Kittie Plater, PT, DPT Pager #: [phone] Office #: [phone]

## 2015-03-04 NOTE — Progress Notes (Signed)
Spoke w/ PCCM tonight regarding placement of rt femoral line for access. He is aware of pt and original plan of RBC transfusion w/ HD. States plts are too low to place a femoral line due to increased bleeding risk from procedure. Advised to call PCCM back tomorrow.   Julious Oka, MD Internal Medicine Resident, PGY [facility] Pager: [phone]

## 2015-03-04 NOTE — Progress Notes (Signed)
ANTICOAGULATION CONSULT NOTE - Follow Up Consult  Pharmacy Consult for Coumadin Indication: stroke and hypercoaguable state  Patient Measurements: Height: 5\' 3"  (160 cm) Weight: 180 lb 12.4 oz (82 kg) IBW/kg (Calculated) : 52.4  Vital Signs: Temp: 98.4 F (36.9 C) (12/05 1019) Temp Source: Oral (12/05 1019) BP: 107/69 mmHg (12/05 1019) Pulse Rate: 118 (12/05 1019)  Labs:  Recent Labs  03/02/15 0729  03/02/15 0755 03/03/15 1822 03/04/15 0542  HGB  --   < > 7.1* 7.0* 6.1*  HCT  --   --  23.9* 23.7* 20.3*  PLT  --   --  35* 7* <5*  LABPROT  --   --  34.7* 32.5* 35.1*  INR  --   --  3.54* 3.24* 3.60*  CREATININE 4.59*  --   --   --   --   < > = values in this interval not displayed.  Assessment: 39 yr old female with ESRD on Coumadin for history of SLE, stroke, and hypercoagulable state. She had been on Coumadin 5 mg daily from 11/16-11/22/16, with gradual rise to therapeutic INR after 5 days. Subsequently held for potential procedure and restarted on 11/28. Warfarin on since 12/2 due to rise in INR, hgb drop and worsening thrombocytopenia.   Date 11/28 11/29 11/30  12/1 12/2 12/3 12/4 12/5  INR 2.31 2.16 2.4 2.9 2.99 Held  Held Hold   Coumadin dose 4 mg 5 mg 2.5 mg 1 mg 2 mg 3.54 3.24 3.6       Goal of Therapy:  INR 2-3 Monitor platelets by anticoagulation protocol: Yes   Plan:   - Continue to hold warfarin at this time  - IMS with formal w/u for thrombocytopenia  - Daily INR while inpatient   - Unasyn stopped 12/5 due to concern that it might be contributing to thrombocytopenia    Vincenza Hews, PharmD, BCPS 03/04/2015, 11:18 AM Pager: [phone]
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
## 2015-03-04 NOTE — Progress Notes (Signed)
Subjective:  "feel a little weak" mild L foot pain/ note Rehab denied admit / Next HD  tomor.  Objective Vital signs in last 24 hours: Filed Vitals:   03/03/15 0757 03/03/15 1517 03/03/15 2212 03/04/15 0539  BP: 108/64 105/51 114/70 109/73  Pulse: 97 101 106 103  Temp: 98.5 F (36.9 C) 98.6 F (37 C) 99.6 F (37.6 C) 99 F (37.2 C)  TempSrc: Oral Oral Oral Oral  Resp: _0 Height:      Weight:   82 kg (180 lb 12.4 oz)   SpO2: 97% 97% 98% 97%   Weight change: 0.2 kg (7.1 oz)  Physical Exam: General: [name] awoken from sleep , NAD Heart: RRR no rub or gallop Lungs:CTA bilat ,nonlabored breathing Abdomen: obese soft , NT, ND Extremities: L thigh swelling and L pedal edema trace/  No r pedal edema  Dialysis Access: L fem AVGG   OP HD= [facility] TTS 4 hr 87.5kg 2/2 bath L thigh AVG Hep 2600 Aranesp 200/week   Problem/Plan:  1 AMS / acute psychosis - on  zyprexa / admit team rx 2 Acinetobacter UTI= on Unasyn 3 ESRD= TTS HD L thigh graft/ HD today to give  Plts and PRBCS  With no IV access currently  4 Recent cath sepsis (pseudomonas) 5 SLE= plaquenil / ? 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  No binders or vit d  (2.0 ca bath with HD) 12 Malnutrition =low alb 1.4 mvi/ b complex   DErnest Haber PA-C [facility] [phone]/07/2014,8:56 AM  LOS: 13 days   Labs: Basic Metabolic Panel:  Recent Labs Lab 02/26/15 0438 02/28/15 0619 03/02/15 0729  NA 138 139 138  K 4.0 4.4  3.8  CL 99* 98* 100*  CO2 _1 GLUCOSE 76 77 86  BUN 36* 28* 17  CREATININE 6.98* 5.69* 4.59*  CALCIUM 8.3* 8.4* 8.2*  PHOS 4.1 4.6  --    Liver Function Tests:  Recent Labs Lab 02/26/15 0438 02/28/15 0619  ALBUMIN 1.5* 1.4*   CBC:  Recent Labs Lab 02/28/15 0619 03/01/15 0915 03/02/15 0755 03/03/15 1822 03/04/15 0542  WBC 8.4 9.3 11.3* 8.9 8.8  HGB 8.4* 8.9* 7.1* 7.0* 6.1*  HCT 28.5* 29.9* 23.9* 23.7* 20.3*  MCV 95.0 95.5 94.1 94.0 93.5  PLT 97* 64* 35* 7* PENDING   Cardiac Enzymes: No results for input(s): CKTOTAL, CKMB, CKMBINDEX, TROPONINI in the last 168 hours. CBG: No results for input(s): GLUCAP in the last 168 hours.  Studies/Results: No results found. Medications:   . sodium chloride   Intravenous Once  . ampicillin-sulbactam (UNASYN) IV  3 g Intravenous Q24H  . B-complex with vitamin C  1 tablet Oral Daily  . Darbepoetin Alfa  200 mcg Intravenous Q Thu-HD  . DULoxetine  30 mg Oral Daily  . feeding supplement  1 Container Oral TID BM  .  feeding supplement (PRO-STAT SUGAR FREE 64)  30 mL Oral BID  . hydroxychloroquine  400 mg Oral Daily  . metoprolol succinate  25 mg Oral Daily  . midodrine  10 mg Oral BID WC  . multivitamin  1 tablet Oral BID  . OLANZapine zydis  5 mg Oral BID PC  . pantoprazole  40 mg Oral Daily  . sodium chloride  3 mL Intravenous Q12H  . Warfarin - Pharmacist Dosing Inpatient   Does not apply [phone]

## 2015-03-04 NOTE — Progress Notes (Signed)
Called report to [place] [facility] on [place].  Jillyn Ledger, MBA, BS, RN

## 2015-03-04 NOTE — Progress Notes (Signed)
Pt arrived to unit at [place]. CHG bath completed. Will continue to monitor.

## 2015-03-04 NOTE — Progress Notes (Signed)
According to the IV team nurse, she is unable to put in a midline per order due to patient's veins and anatomy.  The MD requested a femoral line be placed if possible.  The IV team nurse said she or her department do not place a femoral lines and so the patient will get a peripheral line right now.

## 2015-03-05 DIAGNOSIS — D696 Thrombocytopenia, unspecified: Secondary | ICD-10-CM

## 2015-03-05 LAB — C3 COMPLEMENT: C3 COMPLEMENT: 50 mg/dL — AB (ref 82–167)

## 2015-03-05 LAB — TYPE AND SCREEN
ABO/RH(D): A POS
Antibody Screen: NEGATIVE
UNIT DIVISION: 0
UNIT DIVISION: 0

## 2015-03-05 LAB — BASIC METABOLIC PANEL
ANION GAP: 13 (ref 5–15)
BUN: 11 mg/dL (ref 6–20)
CALCIUM: 8 mg/dL — AB (ref 8.9–10.3)
CO2: 25 mmol/L (ref 22–32)
Chloride: 98 mmol/L — ABNORMAL LOW (ref 101–111)
Creatinine, Ser: 3.05 mg/dL — ABNORMAL HIGH (ref 0.44–1.00)
GFR calc Af Amer: 21 mL/min — ABNORMAL LOW (ref >60)
GFR calc non Af Amer: 18 mL/min — ABNORMAL LOW (ref >60)
GLUCOSE: 68 mg/dL (ref 65–99)
POTASSIUM: 4.1 mmol/L (ref 3.5–5.1)
Sodium: 136 mmol/L (ref 135–145)

## 2015-03-05 LAB — CBC WITH DIFFERENTIAL/PLATELET
Basophils Absolute: 0.1 10*3/uL (ref 0.0–0.1)
Basophils Relative: 1 %
Eosinophils Absolute: 0 10*3/uL (ref 0.0–0.7)
Eosinophils Relative: 0 %
LYMPHS PCT: 6 %
Lymphs Abs: 0.6 10*3/uL — ABNORMAL LOW (ref 0.7–4.0)
MONO ABS: 0.6 10*3/uL (ref 0.1–1.0)
MONOS PCT: 7 %
NEUTROS ABS: 8.2 10*3/uL — AB (ref 1.7–7.7)
Neutrophils Relative %: 86 %

## 2015-03-05 LAB — CBC
HCT: 32.1 % — ABNORMAL LOW (ref 36.0–46.0)
HEMOGLOBIN: 10.5 g/dL — AB (ref 12.0–15.0)
MCH: 29.5 pg (ref 26.0–34.0)
MCHC: 32.7 g/dL (ref 30.0–36.0)
MCV: 90.2 fL (ref 78.0–100.0)
Platelets: 6 10*3/uL — CL (ref 150–400)
RBC: 3.56 MIL/uL — ABNORMAL LOW (ref 3.87–5.11)
RDW: 16.8 % — AB (ref 11.5–15.5)
WBC: 10.2 10*3/uL (ref 4.0–10.5)

## 2015-03-05 LAB — PREPARE PLATELET PHERESIS
UNIT DIVISION: 0
Unit division: 0

## 2015-03-05 LAB — PROTIME-INR
INR: 2.69 — AB (ref 0.00–1.49)
PROTHROMBIN TIME: 28.2 s — AB (ref 11.6–15.2)

## 2015-03-05 LAB — C4 COMPLEMENT: COMPLEMENT C4, BODY FLUID: 19 mg/dL (ref 14–44)

## 2015-03-05 LAB — ANTI-DNA ANTIBODY, DOUBLE-STRANDED: ds DNA Ab: 10 IU/mL — ABNORMAL HIGH (ref 0–9)

## 2015-03-05 MED ORDER — POTASSIUM CHLORIDE CRYS ER 20 MEQ PO TBCR
40.0000 meq | EXTENDED_RELEASE_TABLET | Freq: Once | ORAL | Status: AC
  Administered 2015-03-05: 40 meq via ORAL
  Filled 2015-03-05: qty 2

## 2015-03-05 MED ORDER — SODIUM CHLORIDE 0.9 % IV SOLN
Freq: Once | INTRAVENOUS | Status: AC
  Administered 2015-03-05: 14:00:00 via INTRAVENOUS

## 2015-03-05 MED ORDER — DEXAMETHASONE 4 MG PO TABS
40.0000 mg | ORAL_TABLET | Freq: Every day | ORAL | Status: AC
  Administered 2015-03-05 – 2015-03-08 (×4): 40 mg via ORAL
  Filled 2015-03-05 (×4): qty 1
  Filled 2015-03-05: qty 10
  Filled 2015-03-05: qty 1

## 2015-03-05 MED ORDER — WARFARIN SODIUM 2 MG PO TABS
2.0000 mg | ORAL_TABLET | Freq: Once | ORAL | Status: AC
  Administered 2015-03-05: 2 mg via ORAL
  Filled 2015-03-05: qty 1

## 2015-03-05 NOTE — Progress Notes (Signed)
Subjective: No acute events overnight. Denies hallucinations. Pt says her shortness of breath and intermittent palpitations yesterday morning has since resolved, but she just feels generally fatigued. Denies chest pain, cough, weakness/lightheadedness, abdominal pain, any changes in urination or bowel function, including hematuria, melena or hematochezia, headache, neck pain, vision changes, or any other new symptoms at this time.. She still endorses L leg pain which is unchanged from previous.  Objective: Vital signs in last 24 hours: Filed Vitals:   03/04/15 2313 03/05/15 0345 03/05/15 0735 03/05/15 0912  BP: 113/73 111/75 115/80 128/86  Pulse: 102 103 107 102  Temp: 98.7 F (37.1 C) 98.1 F (36.7 C) 98.3 F (36.8 C)   TempSrc: Oral Oral Oral   Resp: 26 17 22    Height:      Weight:   183 lb 6.8 oz (83.2 kg)   SpO2: 100% 98% 98%    Weight change: -1 lb 5.2 oz (-0.6 kg)  Intake/Output Summary (Last 24 hours) at 03/05/15 1026 Last data filed at 03/05/15 0900  Gross per 24 hour  Intake   1403 ml  Output   1196 ml  Net    207 ml   General Apperance: NAD HEENT: Normocephalic, atraumatic, PERRL, EOMI, anicteric sclera Neck: Supple, trachea midline Lungs: Clear to auscultation bilaterally. No wheezes, rhonchi or rales. Heart: Regular rate and rhythm Abdomen: Soft, nontender, nondistended, no rebound/guarding Extremities: Warm and well perfused, 2+ edema of the left thigh around graft site, 1+ distal LLE edema Pulses: 1+ throughout Skin: No rashes or lesions Neurologic: Alert and oriented x 3. No gross deficits. Psych: Flat affect but appropriate.  Lab Results: Basic Metabolic Panel:  Recent Labs Lab 02/28/15 0619 03/02/15 0729 03/04/15 2000  NA 139 138 137  K 4.4 3.8 3.1*  CL 98* 100* 98*  CO2 30 30 30   GLUCOSE 77 86 66  BUN 28* 17 8  CREATININE 5.69* 4.59* 2.49*  CALCIUM 8.4* 8.2* 7.5*  PHOS 4.6  --   --    Liver Function Tests:  Recent Labs Lab  02/28/15 0619  ALBUMIN 1.4*   CBC:  Recent Labs Lab 03/04/15 2000 03/05/15 0942  WBC 9.2 10.2  HGB 10.4* 10.5*  HCT 31.0* 32.1*  MCV 88.6 90.2  PLT <5* 6*   Coagulation:  Recent Labs Lab 03/01/15 0915 03/02/15 0755 03/03/15 1822 03/04/15 0542  LABPROT 30.5* 34.7* 32.5* 35.1*  INR 2.99* 3.54* 3.24* 3.60*    Micro Results: Recent Results (from the past 240 hour(s))  Culture, blood (routine x 2)     Status: None   Collection Time: 02/27/15  3:41 PM  Result Value Ref Range Status   Specimen Description BLOOD RIGHT HAND  Final   Special Requests IN PEDIATRIC BOTTLE  2CC  Final   Culture NO GROWTH 5 DAYS  Final   Report Status 03/04/2015 FINAL  Final  Culture, blood (routine x 2)     Status: None   Collection Time: 02/27/15  5:10 PM  Result Value Ref Range Status   Specimen Description BLOOD RIGHT HAND  Final   Special Requests IN PEDIATRIC BOTTLE  3CC  Final   Culture NO GROWTH 5 DAYS  Final   Report Status 03/04/2015 FINAL  Final  MRSA PCR Screening     Status: None   Collection Time: 03/04/15  8:02 PM  Result Value Ref Range Status   MRSA by PCR NEGATIVE NEGATIVE Final    Comment:        The GeneXpert  MRSA Assay (FDA approved for NASAL specimens only), is one component of a comprehensive MRSA colonization surveillance program. It is not intended to diagnose MRSA infection nor to guide or monitor treatment for MRSA infections.    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   Acute psychosis, Depression: She had acute confusion and hallucinations secondary UTI. Her symptoms improved but worsened 12/9. 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 Low suspicion for Lupus cerebritis. She remains afebrile. Repeat blood cultures on 11/30 NGTD. These symptoms continue to  resolve with olanzapine. -D/c'ed Unasyn on 12/5 2/2 thrombocytopenia - received 10 days of abx (initially to complete 14 day course from 11/25 (end date: 12/9)) -Continue olanzapine 5mg  BID, haldol 0.5mg  PO PRN for agitation -Monitor QTc -Continue duloxetine 30mg  QD. -Consulted psychiatry, appreciate recs  Acute on chronic anemia and thrombocytopenia: Hgb down to 7.1 on 12/3 from 8.9. Further decreased to 6.1 on 12/5 with Plt count trending down to 7 as well. No signs or symptoms of acute bleeding. Pt is symptomatic from the anemia with shortness of breath and palpitations. Most likely 2/2 lupus flare as pt has history of similar episodes but Unasyn is also a potential precipitant. 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 LDH only mildly elevated with inappropriately low retic, likely not hemolyzing currently and may be more of a hypoproliferative process. C3 is decreased, C4 normal. Likely 2/2 lupus flare. -Got 1 day of pred 60 but switching to dexamethasone 40mg  daily x 4 days for presumed SLE thrombocytopenia -Transfused 2U PRBCs and 1U plts on 12/6. Will transfuse 1U plts today. -Follow-up dsDNA -Post-transfusion CBC -Coumadin per pharm  ESRD on HD, s/p Left thigh AVG:AVG placed 01/04/2015. Back on her outpatient TTS schedule. She has some left leg edema likely 2/2 AVG. Venous duplex with post surgical changes and no DVT. -Midodrine 10mg  BID -Nephrology following, appreciate recs -HD per nephrology -Percocet 5/325mg  q 6 hours prn pain  Hx recent R. MCA Embolic CVA: PT recommending CIR. -Daily PT/INR -Coumadin per pharm -Continue PT  SLE: Continue Plaquenil Hx SVT/Tachycardia: Continue Toprol-XL 25mg   FEN: Renal VTE ppx: Coumadin  FULL CODE  Dispo: Will likely go to SNF or [facility] upon discharge.  Anticipated discharge in approximately 2-3 day(s).   The patient does have a current PCP Lin Landsman, MD) and does not need an [facility] hospital follow-up appointment after  discharge.  The patient does not have transportation limitations that hinder transportation to clinic appointments.   LOS: 14 days   Norval Gable, MD 03/05/2015, 10:26 AM

## 2015-03-05 NOTE — Progress Notes (Signed)
Occupational Therapy Treatment Patient Details Name: [name] MRN: [id number] DOB: 16-Nov-1975 Today's Date: 03/05/2015    History of present illness Pt is a 39 y.o. F from CIR who has had several recent hospitalizations.  She had a Rt MCA infarction and developed pseudomonal bacteremia from Rt femoral HD line which was treated.  Was doing well in CIR until she acutely developed severe confusion and hallucinations following HD sessions, likely a result of sepsis from Lt ant thigh abscess. Pt's additional PMH includes lupus, fibromyalgia, anemia.   OT comments  This 39 yo female admitted with above presents to acute OT with decreased mobility, decreased balance, generalized weakness, increased WOB today all affecting her ability to care for herself and increasing burden of care on caregivers. She will continue to benefit from acute OT with follow up on CIR.  Follow Up Recommendations  CIR          Precautions / Restrictions Precautions Precautions: Fall Precaution Comments: pt with high anxiety about falling Restrictions Weight Bearing Restrictions: No       Mobility Bed Mobility Overal bed mobility: Needs Assistance;+2 for physical assistance Bed Mobility: Rolling;Sidelying to Sit;Sit to Supine Rolling: Mod assist (with use of rail and HOB down) Sidelying to sit: Max assist (with use of rail and HOB 20 degrees)   Sit to supine: Total assist;+2 for physical assistance   General bed mobility comments: Required more physical assist today with bed mobility. Mod-Max assist with tactile cues to initiate roll and use of rail. Total A to scoot forward to EOB with use of pad.      Balance Overall balance assessment: Needs assistance Sitting-balance support: Bilateral upper extremity supported;Feet supported   Sitting balance - Comments: varied between fair and poor. Pt sat EOB 8 minutes                           ADL       Grooming: Wash/dry hands;Wash/dry face;Set  up;Bed level                                 General ADL Comments: attempted to sit EOB and do UBB; however upon getting up to EOB pt with sats down to high 70's and HR up to 126 with pt c/o difficulty "catching her breath". Returned to supine with help of RN.                Cognition   Behavior During Therapy: Anxious Overall Cognitive Status: Impaired/Different from baseline Area of Impairment: Problem solving              Problem Solving: Slow processing;Requires verbal cues;Difficulty sequencing                   Pertinent Vitals/ Pain       Pain Assessment: Faces Faces Pain Scale: Hurts little more Pain Location: LLE Pain Descriptors / Indicators: Aching;Sore Pain Intervention(s): Monitored during session;Repositioned         Frequency Min 2X/week     Progress Toward Goals  OT Goals(current goals can now be found in the care plan section)  Progress towards OT goals: Not progressing toward goals - comment (increased WOB today with drop in sats and increase in HR with activity)     Plan Discharge plan remains appropriate       End of Session     Activity Tolerance  Patient limited by fatigue (limited by increased WOB)   Patient Left in bed;with call bell/phone within reach   Nurse Communication  (Pt stating she did not want to eat her breakfast even after I offered to call and get something else for her)        Time: 0830-0900 OT Time Calculation (min): 30 min  Charges: OT General Charges $OT Visit: 1 Procedure OT Treatments $Self Care/Home Management : 23-37 mins  Almon Register [id number] 03/05/2015, 9:18 AM

## 2015-03-05 NOTE — Progress Notes (Signed)
PT Cancellation Note  Patient Details Name: [name] MRN: [id number] DOB: June 26, 1975   Cancelled Treatment:    Reason Eval/Treat Not Completed: Medical issues which prohibited therapy.  Pt vomiting per nurse and had episode this am of incr HR and desaturation with OT. Will check back tomorrow.  Thanks.    Irwin Brakeman F 03/05/2015, 11:48 AM  [facility] [phone] (pager)

## 2015-03-05 NOTE — Progress Notes (Signed)
[name] ROUNDING NOTE   Subjective:   Interval History:  No complaints  Events of last night noted no dyspnea  Objective:  Vital signs in last 24 hours:  Temp:  [96.9 F (36.1 C)-98.7 F (37.1 C)] 98.3 F (36.8 C) (12/06 0735) Pulse Rate:  [45-132] 102 (12/06 0912) Resp:  [17-34] 22 (12/06 0735) BP: (81-128)/(60-88) 128/86 mmHg (12/06 0912) SpO2:  [94 %-100 %] 98 % (12/06 0735) Weight:  [81.4 kg (179 lb 7.3 oz)-83.2 kg (183 lb 6.8 oz)] 83.2 kg (183 lb 6.8 oz) (12/06 0735)  Weight change: -0.6 kg (-1 lb 5.2 oz) Filed Weights   03/03/15 2212 03/04/15 1301 03/05/15 0735  Weight: 82 kg (180 lb 12.4 oz) 81.4 kg (179 lb 7.3 oz) 83.2 kg (183 lb 6.8 oz)    Intake/Output: I/O last 3 completed shifts: In: [id number] [P.O.:300; Blood:1063] Out: 1196 [Other:1196]   Intake/Output this shift:     General: [name] awoken from sleep , NAD Heart: RRR no rub or gallop Lungs:CTA bilat ,nonlabored breathing Abdomen: obese soft , NT, ND Extremities: L thigh swelling and L pedal edema trace/ No r pedal edema  Dialysis Access: L fem AVGG   Basic Metabolic Panel:  Recent Labs Lab 02/28/15 0619 03/02/15 0729 03/04/15 2000  NA 139 138 137  K 4.4 3.8 3.1*  CL 98* 100* 98*  CO2 30 30 30   GLUCOSE 77 86 66  BUN 28* 17 8  CREATININE 5.69* 4.59* 2.49*  CALCIUM 8.4* 8.2* 7.5*  PHOS 4.6  --   --     Liver Function Tests:  Recent Labs Lab 02/28/15 0619  ALBUMIN 1.4*   No results for input(s): LIPASE, AMYLASE in the last 168 hours. No results for input(s): AMMONIA in the last 168 hours.  CBC:  Recent Labs Lab 03/01/15 0915 03/02/15 0755 03/03/15 1822 03/04/15 0542 03/04/15 2000  WBC 9.3 11.3* 8.9 8.8 9.2  HGB 8.9* 7.1* 7.0* 6.1* 10.4*  HCT 29.9* 23.9* 23.7* 20.3* 31.0*  MCV 95.5 94.1 94.0 93.5 88.6  PLT 64* 35* 7* <5* <5*    Cardiac Enzymes: No results for input(s): CKTOTAL, CKMB, CKMBINDEX, TROPONINI in the last 168 hours.  BNP: Invalid input(s):  POCBNP  CBG: No results for input(s): GLUCAP in the last 168 hours.  Microbiology: Results for orders placed or performed during the hospital encounter of 02/19/15  Urine culture     Status: None   Collection Time: 02/20/15 12:35 PM  Result Value Ref Range Status   Specimen Description URINE, CATHETERIZED  Final   Special Requests NONE  Final   Culture   Final    >=100,000 COLONIES/mL VANCOMYCIN RESISTANT ENTEROCOCCUS >=100,000 COLONIES/mL ACINETOBACTER CALCOACETICUS/BAUMANNII COMPLEX    Report Status 02/24/2015 FINAL  Final   Organism ID, Bacteria ACINETOBACTER CALCOACETICUS/BAUMANNII COMPLEX  Final   Organism ID, Bacteria VANCOMYCIN RESISTANT ENTEROCOCCUS  Final      Susceptibility   Acinetobacter calcoaceticus/baumannii complex - MIC*    CEFTAZIDIME >=64 RESISTANT Resistant     CEFTRIAXONE >=64 RESISTANT Resistant     CIPROFLOXACIN >=4 RESISTANT Resistant     GENTAMICIN 2 SENSITIVE Sensitive     IMIPENEM 2 SENSITIVE Sensitive     PIP/TAZO 32 INTERMEDIATE Intermediate     TRIMETH/SULFA >=320 RESISTANT Resistant     AMPICILLIN/SULBACTAM 4 SENSITIVE Sensitive     * >=100,000 COLONIES/mL ACINETOBACTER CALCOACETICUS/BAUMANNII COMPLEX   Vancomycin resistant enterococcus - MIC*    AMPICILLIN <=2 SENSITIVE Sensitive     LEVOFLOXACIN >=8 RESISTANT Resistant  NITROFURANTOIN <=16 SENSITIVE Sensitive     VANCOMYCIN >=32 RESISTANT Resistant     LINEZOLID 2 SENSITIVE Sensitive     * >=100,000 COLONIES/mL VANCOMYCIN RESISTANT ENTEROCOCCUS  Culture, blood (routine x 2)     Status: None   Collection Time: 02/20/15  3:30 PM  Result Value Ref Range Status   Specimen Description BLOOD VENOUS  Final   Special Requests BOTTLES DRAWN AEROBIC AND ANAEROBIC 10ML  Final   Culture NO GROWTH 5 DAYS  Final   Report Status 02/25/2015 FINAL  Final  Culture, blood (routine x 2)     Status: None   Collection Time: 02/20/15  4:16 PM  Result Value Ref Range Status   Specimen Description BLOOD  ARTERIAL LINE  Final   Special Requests BOTTLES DRAWN AEROBIC AND ANAEROBIC 10ML  Final   Culture NO GROWTH 5 DAYS  Final   Report Status 02/25/2015 FINAL  Final  Culture, blood (routine x 2)     Status: None   Collection Time: 02/21/15  8:36 PM  Result Value Ref Range Status   Specimen Description BLOOD RIGHT HAND  Final   Special Requests IN PEDIATRIC BOTTLE 3CC  Final   Culture NO GROWTH 5 DAYS  Final   Report Status 02/26/2015 FINAL  Final  Culture, blood (routine x 2)     Status: None   Collection Time: 02/21/15  9:21 PM  Result Value Ref Range Status   Specimen Description BLOOD RIGHT THUMB  Final   Special Requests IN PEDIATRIC BOTTLE 1.5CC  Final   Culture NO GROWTH 5 DAYS  Final   Report Status 02/26/2015 FINAL  Final  Culture, blood (routine x 2)     Status: None   Collection Time: 02/27/15  3:41 PM  Result Value Ref Range Status   Specimen Description BLOOD RIGHT HAND  Final   Special Requests IN PEDIATRIC BOTTLE  2CC  Final   Culture NO GROWTH 5 DAYS  Final   Report Status 03/04/2015 FINAL  Final  Culture, blood (routine x 2)     Status: None   Collection Time: 02/27/15  5:10 PM  Result Value Ref Range Status   Specimen Description BLOOD RIGHT HAND  Final   Special Requests IN PEDIATRIC BOTTLE  3CC  Final   Culture NO GROWTH 5 DAYS  Final   Report Status 03/04/2015 FINAL  Final  MRSA PCR Screening     Status: None   Collection Time: 03/04/15  8:02 PM  Result Value Ref Range Status   MRSA by PCR NEGATIVE NEGATIVE Final    Comment:        The GeneXpert MRSA Assay (FDA approved for NASAL specimens only), is one component of a comprehensive MRSA colonization surveillance program. It is not intended to diagnose MRSA infection nor to guide or monitor treatment for MRSA infections.     Coagulation Studies:  Recent Labs  03/03/15 1822 03/04/15 0542  LABPROT 32.5* 35.1*  INR 3.24* 3.60*    Urinalysis: No results for input(s): COLORURINE, LABSPEC,  PHURINE, GLUCOSEU, HGBUR, BILIRUBINUR, KETONESUR, PROTEINUR, UROBILINOGEN, NITRITE, LEUKOCYTESUR in the last 72 hours.  Invalid input(s): APPERANCEUR    Imaging: No results found.   Medications:     . sodium chloride   Intravenous Once  . sodium chloride   Intravenous Once  . sodium chloride   Intravenous Once  . B-complex with vitamin C  1 tablet Oral Daily  . Darbepoetin Alfa  200 mcg Intravenous Q Thu-HD  . DULoxetine  40 mg Oral Daily  . feeding supplement  1 Container Oral TID BM  . feeding supplement (PRO-STAT SUGAR FREE 64)  30 mL Oral BID  . hydroxychloroquine  400 mg Oral Daily  . metoprolol succinate  25 mg Oral Daily  . midodrine  10 mg Oral BID WC  . multivitamin  1 tablet Oral BID  . OLANZapine zydis  10 mg Oral BID PC  . pantoprazole  40 mg Oral Daily  . predniSONE  60 mg Oral Q breakfast  . sodium chloride  3 mL Intravenous Q12H  . Warfarin - Pharmacist Dosing Inpatient   Does not apply q1800   acetaminophen, acetaminophen, haloperidol, Influenza vac split quadrivalent PF, ondansetron **OR** ondansetron (ZOFRAN) IV, oxyCODONE-acetaminophen, pneumococcal 23 valent vaccine, polyethylene glycol  Assessment/ Plan:  OP HD= [facility] TTS 4 hr 87.5kg 2/2 bath L thigh AVG Hep 2600 Aranesp 200/week  1 AMS / acute psychosis - on zyprexa, Cymbalta / admit team rx  PRN Haldol and lorazepam 2 Acinetobacter UTI= on Unasyn 3 ESRD= TTS HD L thigh graft/  Dialysis  12/5 4 Recent cath sepsis (pseudomonas) 5 SLE= plaquenil / ? 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

## 2015-03-05 NOTE — Progress Notes (Signed)
Pt had hot tea 10 minutes prior to temp reading taken orally at 1720. Pt asymptomatic and no significant change in other vitals. Rechecked temp at 1743 and was afebrile. Will continue to monitor.

## 2015-03-05 NOTE — Progress Notes (Signed)
ANTICOAGULATION CONSULT NOTE - Follow Up Consult  Pharmacy Consult for Coumadin Indication: stroke and hypercoaguable state  Patient Measurements: Height: 5\' 3"  (160 cm) Weight: 183 lb 6.8 oz (83.2 kg) IBW/kg (Calculated) : 52.4  Vital Signs: Temp: 98.3 F (36.8 C) (12/06 0735) Temp Source: Oral (12/06 0735) BP: 115/80 mmHg (12/06 0735) Pulse Rate: 107 (12/06 0735)  Labs:  Recent Labs  03/03/15 1822 03/04/15 0542 03/04/15 2000  HGB 7.0* 6.1* 10.4*  HCT 23.7* 20.3* 31.0*  PLT 7* <5* <5*  LABPROT 32.5* 35.1*  --   INR 3.24* 3.60*  --   CREATININE  --   --  2.49*    Assessment: 39 yr old female with ESRD on Coumadin for history of SLE, stroke, and hypercoagulable state. She had been on Coumadin 5 mg daily from 11/16-11/22/16, with gradual rise to therapeutic INR after 5 days. Subsequently held for potential procedure and restarted on 11/28. Warfarin on hold since 12/2 due to rise in INR, hgb drop and worsening thrombocytopenia. Last [place]man Comp is now down to 2.69. Hgb up to 10.4, plts remain < 5. Transfused on 12/5. No bleeding noted.  Goal of Therapy:  INR 2-3 Monitor platelets by anticoagulation protocol: Yes   Plan:  Start back coumadin at 2mg  PO x 1 tonight Monitor daily INR, CBC, plts, s/s of bleed  Elenor Quinones, PharmD, [facility] Clinical Pharmacist Pager [phone] 03/05/2015 8:57 AM

## 2015-03-05 NOTE — Progress Notes (Signed)
Utilization Review Completed.  

## 2015-03-06 LAB — CBC WITH DIFFERENTIAL/PLATELET
BASOS ABS: 0.7 10*3/uL — AB (ref 0.0–0.1)
BASOS PCT: 4 %
EOS ABS: 0.2 10*3/uL (ref 0.0–0.7)
Eosinophils Relative: 1 %
HEMATOCRIT: 28.4 % — AB (ref 36.0–46.0)
HEMOGLOBIN: 9.5 g/dL — AB (ref 12.0–15.0)
LYMPHS PCT: 5 %
Lymphs Abs: 0.9 10*3/uL (ref 0.7–4.0)
MCH: 29.8 pg (ref 26.0–34.0)
MCHC: 33.5 g/dL (ref 30.0–36.0)
MCV: 89 fL (ref 78.0–100.0)
Monocytes Absolute: 0.9 10*3/uL (ref 0.1–1.0)
Monocytes Relative: 5 %
NEUTROS PCT: 85 %
Neutro Abs: 15.6 10*3/uL — ABNORMAL HIGH (ref 1.7–7.7)
Platelets: 17 10*3/uL — CL (ref 150–400)
RBC: 3.19 MIL/uL — ABNORMAL LOW (ref 3.87–5.11)
RDW: 16.8 % — ABNORMAL HIGH (ref 11.5–15.5)
WBC: 18.3 10*3/uL — ABNORMAL HIGH (ref 4.0–10.5)

## 2015-03-06 LAB — PREPARE PLATELET PHERESIS: Unit division: 0

## 2015-03-06 LAB — HEPATITIS B SURFACE ANTIGEN: HEP B S AG: NEGATIVE

## 2015-03-06 LAB — PROTIME-INR
INR: 2.54 — AB (ref 0.00–1.49)
PROTHROMBIN TIME: 27 s — AB (ref 11.6–15.2)

## 2015-03-06 MED ORDER — SALINE SPRAY 0.65 % NA SOLN
1.0000 | NASAL | Status: DC | PRN
  Filled 2015-03-06: qty 44

## 2015-03-06 NOTE — Progress Notes (Signed)
Subjective: No acute events overnight. Denies hallucinations. Has not had any recurrent SOB or palpitations, says her energy is improved. Only complaint at this time is a sore bottom from sitting up in her chair and is requesting to lay back down in bed at the moment. She is also having scant blood mixed with clear mucus when she blows her nose. Denies chest pain, cough, weakness/lightheadedness, abdominal pain, any changes in urination or bowel function, including hematuria, melena or hematochezia, headache, neck pain, vision changes, or any other new symptoms at this time.. She still endorses L leg pain which is unchanged from previous.  Objective: Vital signs in last 24 hours: Filed Vitals:   03/06/15 0348 03/06/15 0800 03/06/15 0946 03/06/15 1033  BP: 157/86 146/83 135/90 137/97  Pulse: 67 78 96 99  Temp: 97.1 F (36.2 C)  97.7 F (36.5 C)   TempSrc: Axillary  Axillary   Resp: 20 22 26    Height:      Weight:      SpO2: 100% 100% 100%    Weight change: 3 lb 15.5 oz (1.8 kg)  Intake/Output Summary (Last 24 hours) at 03/06/15 1036 Last data filed at 03/05/15 1745  Gross per 24 hour  Intake    858 ml  Output      0 ml  Net    858 ml   General Apperance: NAD HEENT: Normocephalic, atraumatic, PERRL, EOMI, anicteric sclera Neck: Supple, trachea midline Lungs: Clear to auscultation bilaterally. No wheezes, rhonchi or rales. Heart: Regular rate and rhythm Abdomen: Soft, nontender, nondistended, no rebound/guarding Extremities: Warm and well perfused, 2+ edema of the left thigh around graft site, 1+ distal LLE edema. Scattered ecchymoses in the R antecubital fossa. Pulses: 1+ throughout Skin: No rashes or lesions Neurologic: Alert and oriented x 3. No gross deficits. Psych: Improved affect, still flat but less so than previous exams.   Lab Results: Basic Metabolic Panel:  Recent Labs Lab 02/28/15 0619  03/04/15 2000 03/05/15 0942  NA 139  < > 137 136  K 4.4  < > 3.1* 4.1   CL 98*  < > 98* 98*  CO2 30  < > 30 25  GLUCOSE 77  < > 66 68  BUN 28*  < > 8 11  CREATININE 5.69*  < > 2.49* 3.05*  CALCIUM 8.4*  < > 7.5* 8.0*  PHOS 4.6  --   --   --   < > = values in this interval not displayed. Liver Function Tests:  Recent Labs Lab 02/28/15 0619  ALBUMIN 1.4*   CBC:  Recent Labs Lab 03/05/15 1206 [id number]  WBC DUPLICATE REQUEST  SEE [id number] 18.3*  NEUTROABS 8.2* [id number]*  HGB DUPLICATE REQUEST  SEE [id number] 9.5*  HCT DUPLICATE REQUEST  SEE [id number]*  MCV DUPLICATE REQUEST  SEE [id number]  PLT DUPLICATE REQUEST  SEE [id number] 17*   Coagulation:  Recent Labs Lab 03/03/15 1822 03/04/15 0542 03/05/15 0942 03/06/15 0357  LABPROT 32.5* 35.1* 28.2* 27.0*  INR 3.24* 3.60* 2.69* 2.54*    Micro Results: Recent Results (from the past 240 hour(s))  Culture, blood (routine x 2)     Status: None   Collection Time: 02/27/15  3:41 PM  Result Value Ref Range Status   Specimen Description BLOOD RIGHT HAND  Final   Special Requests IN PEDIATRIC BOTTLE  [facility]  Final   Culture NO GROWTH 5 DAYS  Final   Report Status 03/04/2015 FINAL  Final  Culture, blood (  routine x 2)     Status: None   Collection Time: 02/27/15  5:10 PM  Result Value Ref Range Status   Specimen Description BLOOD RIGHT HAND  Final   Special Requests IN PEDIATRIC BOTTLE  [facility]  Final   Culture NO GROWTH 5 DAYS  Final   Report Status 03/04/2015 FINAL  Final  MRSA PCR Screening     Status: None   Collection Time: 03/04/15  8:02 PM  Result Value Ref Range Status   MRSA by PCR NEGATIVE NEGATIVE Final    Comment:        The GeneXpert MRSA Assay (FDA approved for NASAL specimens only), is one component of a comprehensive MRSA colonization surveillance program. It is not intended to diagnose MRSA infection nor to guide or monitor treatment for MRSA infections.    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   Acute psychosis, Depression: She had acute confusion and hallucinations secondary UTI. Her symptoms improved but worsened 12/9. 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 Low suspicion for Lupus cerebritis. She remains afebrile. Repeat blood cultures on 11/30 NGTD. These symptoms continue to resolve with olanzapine. -D/c'ed Unasyn on 12/5 2/2 thrombocytopenia - received 10 days of abx (initially to complete 14 day course from 11/25 (end date: 12/9)) -Continue olanzapine 5mg  BID, haldol 0.5mg  PO PRN for agitation -Monitor QTc -Continue duloxetine 30mg  QD. -Consulted psychiatry, appreciate recs  Acute on chronic anemia and thrombocytopenia: Hgb down to 7.1 on 12/3 from 8.9. Further decreased to 6.1 on 12/5 with Plt count trending down to 7 as well. No signs or symptoms of acute bleeding. Pt is symptomatic from the anemia with shortness of breath and palpitations. Most likely 2/2 lupus flare as pt has history of similar episodes but Unasyn is also a potential precipitant. 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 LDH only mildly elevated with inappropriately low retic, likely not hemolyzing currently and may be more of a hypoproliferative process. C3 is decreased, C4 normal. Likely 2/2 lupus flare. 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 1U plts on 12/7. Transfuse PRN -Follow-up dsDNA -F/u CBCs -Coumadin per pharm  ESRD on HD, s/p Left thigh AVG:AVG placed 01/04/2015. Back on her outpatient TTS schedule. She has some left leg edema likely 2/2 AVG. Venous duplex with post surgical changes and no DVT. -Midodrine 10mg  BID -Nephrology following, appreciate recs -HD per nephrology -Percocet 5/325mg  q 6 hours prn pain  Hx recent R. MCA Embolic  CVA: PT recommending CIR. -Daily PT/INR -Coumadin per pharm -Continue PT  SLE: Continue Plaquenil Hx SVT/Tachycardia: Continue Toprol-XL 25mg   FEN: Renal VTE ppx: Coumadin  FULL CODE  Dispo: Will likely go to SNF or [facility] upon discharge.  Anticipated discharge in approximately 2-3 day(s).   The patient does have a current PCP Lin Landsman, MD) and does not need an [facility] hospital follow-up appointment after discharge.  The patient does not have transportation limitations that hinder transportation to clinic appointments.   LOS: 15 days   Norval Gable, MD 03/06/2015, 10:36 AM

## 2015-03-06 NOTE — Progress Notes (Signed)
[facility] ROUNDING NOTE   Subjective:   Interval History: Sore sacral area today   Objective:  Vital signs in last 24 hours:  Temp:  [97.1 F (36.2 C)-99.5 F (37.5 C)] 97.7 F (36.5 C) (12/07 0946) Pulse Rate:  [67-96] 96 (12/07 0946) Resp:  [19-31] 26 (12/07 0946) BP: (132-174)/(80-95) 135/90 mmHg (12/07 0946) SpO2:  [96 %-100 %] 100 % (12/07 0946)  Weight change: 1.8 kg (3 lb 15.5 oz) Filed Weights   03/03/15 2212 03/04/15 1301 03/05/15 0735  Weight: 82 kg (180 lb 12.4 oz) 81.4 kg (179 lb 7.3 oz) 83.2 kg (183 lb 6.8 oz)    Intake/Output: I/O last 3 completed shifts: In: [id number] [P.O.:940; I.V.:110; Blood:398] Out: -    Intake/Output this shift:     General: [name] awoken from sleep , NAD Heart: RRR no rub or gallop Lungs:CTA bilat ,nonlabored breathing Abdomen: obese soft , NT, ND Extremities: L thigh swelling and L pedal edema trace/ No r pedal edema  Dialysis Access: L fem AVGG   Basic Metabolic Panel:  Recent Labs Lab 02/28/15 0619 03/02/15 0729 03/04/15 2000 03/05/15 0942  NA 139 138 137 136  K 4.4 3.8 3.1* 4.1  CL 98* 100* 98* 98*  CO2 30 30 30 25   GLUCOSE 77 86 66 68  BUN 28* 17 8 11   CREATININE 5.69* 4.59* 2.49* 3.05*  CALCIUM 8.4* 8.2* 7.5* 8.0*  PHOS 4.6  --   --   --     Liver Function Tests:  Recent Labs Lab 02/28/15 0619  ALBUMIN 1.4*   No results for input(s): LIPASE, AMYLASE in the last 168 hours. No results for input(s): AMMONIA in the last 168 hours.  CBC:  Recent Labs Lab 03/04/15 0542 03/04/15 2000 03/05/15 0942 03/05/15 1206 03/06/15 0906  WBC 8.8 9.2 [id number] DUPLICATE REQUEST  SEE [id number] 18.3*  NEUTROABS  --   --   --  8.2* 15.6*  HGB 6.1* [id number]* [id number]* DUPLICATE REQUEST  SEE [id number] 9.5*  HCT 20.3* 0000000* [id number]* DUPLICATE REQUEST  SEE [id number] 28.4*  MCV 99991111 [id number] DUPLICATE REQUEST  SEE 99991111 89.0  PLT <5* <5* 6* DUPLICATE REQUEST  SEE [id number] 17*    Cardiac Enzymes: No results for input(s):  CKTOTAL, CKMB, CKMBINDEX, TROPONINI in the last 168 hours.  BNP: Invalid input(s): POCBNP  CBG: No results for input(s): GLUCAP in the last 168 hours.  Microbiology: Results for orders placed or performed during the hospital encounter of 02/19/15  Urine culture     Status: None   Collection Time: 02/20/15 12:35 PM  Result Value Ref Range Status   Specimen Description URINE, CATHETERIZED  Final   Special Requests NONE  Final   Culture   Final    >=100,000 COLONIES/mL VANCOMYCIN RESISTANT ENTEROCOCCUS >=100,000 COLONIES/mL ACINETOBACTER CALCOACETICUS/BAUMANNII COMPLEX    Report Status 02/24/2015 FINAL  Final   Organism ID, Bacteria ACINETOBACTER CALCOACETICUS/BAUMANNII COMPLEX  Final   Organism ID, Bacteria VANCOMYCIN RESISTANT ENTEROCOCCUS  Final      Susceptibility   Acinetobacter calcoaceticus/baumannii complex - MIC*    CEFTAZIDIME >=64 RESISTANT Resistant     CEFTRIAXONE >=64 RESISTANT Resistant     CIPROFLOXACIN >=4 RESISTANT Resistant     GENTAMICIN 2 SENSITIVE Sensitive     IMIPENEM 2 SENSITIVE Sensitive     PIP/TAZO 32 INTERMEDIATE Intermediate     TRIMETH/SULFA >=320 RESISTANT Resistant     AMPICILLIN/SULBACTAM 4 SENSITIVE Sensitive     * >=100,000 COLONIES/mL ACINETOBACTER CALCOACETICUS/BAUMANNII COMPLEX  Vancomycin resistant enterococcus - MIC*    AMPICILLIN <=2 SENSITIVE Sensitive     LEVOFLOXACIN >=8 RESISTANT Resistant     NITROFURANTOIN <=16 SENSITIVE Sensitive     VANCOMYCIN >=32 RESISTANT Resistant     LINEZOLID 2 SENSITIVE Sensitive     * >=100,000 COLONIES/mL VANCOMYCIN RESISTANT ENTEROCOCCUS  Culture, blood (routine x 2)     Status: None   Collection Time: 02/20/15  3:30 PM  Result Value Ref Range Status   Specimen Description BLOOD VENOUS  Final   Special Requests BOTTLES DRAWN AEROBIC AND ANAEROBIC 10ML  Final   Culture NO GROWTH 5 DAYS  Final   Report Status 02/25/2015 FINAL  Final  Culture, blood (routine x 2)     Status: None   Collection Time:  02/20/15  4:16 PM  Result Value Ref Range Status   Specimen Description BLOOD ARTERIAL LINE  Final   Special Requests BOTTLES DRAWN AEROBIC AND ANAEROBIC 10ML  Final   Culture NO GROWTH 5 DAYS  Final   Report Status 02/25/2015 FINAL  Final  Culture, blood (routine x 2)     Status: None   Collection Time: 02/21/15  8:36 PM  Result Value Ref Range Status   Specimen Description BLOOD RIGHT HAND  Final   Special Requests IN PEDIATRIC BOTTLE 3CC  Final   Culture NO GROWTH 5 DAYS  Final   Report Status 02/26/2015 FINAL  Final  Culture, blood (routine x 2)     Status: None   Collection Time: 02/21/15  9:21 PM  Result Value Ref Range Status   Specimen Description BLOOD RIGHT THUMB  Final   Special Requests IN PEDIATRIC BOTTLE 1.5CC  Final   Culture NO GROWTH 5 DAYS  Final   Report Status 02/26/2015 FINAL  Final  Culture, blood (routine x 2)     Status: None   Collection Time: 02/27/15  3:41 PM  Result Value Ref Range Status   Specimen Description BLOOD RIGHT HAND  Final   Special Requests IN PEDIATRIC BOTTLE  2CC  Final   Culture NO GROWTH 5 DAYS  Final   Report Status 03/04/2015 FINAL  Final  Culture, blood (routine x 2)     Status: None   Collection Time: 02/27/15  5:10 PM  Result Value Ref Range Status   Specimen Description BLOOD RIGHT HAND  Final   Special Requests IN PEDIATRIC BOTTLE  3CC  Final   Culture NO GROWTH 5 DAYS  Final   Report Status 03/04/2015 FINAL  Final  MRSA PCR Screening     Status: None   Collection Time: 03/04/15  8:02 PM  Result Value Ref Range Status   MRSA by PCR NEGATIVE NEGATIVE Final    Comment:        The GeneXpert MRSA Assay (FDA approved for NASAL specimens only), is one component of a comprehensive MRSA colonization surveillance program. It is not intended to diagnose MRSA infection nor to guide or monitor treatment for MRSA infections.     Coagulation Studies:  Recent Labs  03/03/15 1822 03/04/15 0542 03/05/15 0942 03/06/15 0357   LABPROT 32.5* 35.1* 28.2* 27.0*  INR 3.24* 3.60* 2.69* 2.54*    Urinalysis: No results for input(s): COLORURINE, LABSPEC, PHURINE, GLUCOSEU, HGBUR, BILIRUBINUR, KETONESUR, PROTEINUR, UROBILINOGEN, NITRITE, LEUKOCYTESUR in the last 72 hours.  Invalid input(s): APPERANCEUR    Imaging: No results found.   Medications:     . sodium chloride   Intravenous Once  . sodium chloride   Intravenous Once  .  sodium chloride   Intravenous Once  . B-complex with vitamin C  1 tablet Oral Daily  . Darbepoetin Alfa  200 mcg Intravenous Q Thu-HD  . dexamethasone  40 mg Oral Daily  . DULoxetine  40 mg Oral Daily  . feeding supplement  1 Container Oral TID BM  . feeding supplement (PRO-STAT SUGAR FREE 64)  30 mL Oral BID  . hydroxychloroquine  400 mg Oral Daily  . metoprolol succinate  25 mg Oral Daily  . midodrine  10 mg Oral BID WC  . multivitamin  1 tablet Oral BID  . OLANZapine zydis  10 mg Oral BID PC  . pantoprazole  40 mg Oral Daily  . sodium chloride  3 mL Intravenous Q12H  . Warfarin - Pharmacist Dosing Inpatient   Does not apply q1800   acetaminophen, acetaminophen, haloperidol, Influenza vac split quadrivalent PF, ondansetron **OR** ondansetron (ZOFRAN) IV, oxyCODONE-acetaminophen, pneumococcal 23 valent vaccine, polyethylene glycol  Assessment/ Plan:  OP HD= [facility] TTS 4 hr 87.5kg 2/2 bath L thigh AVG Hep 2600 Aranesp 200/week  1 AMS / acute psychosis - on zyprexa, Cymbalta / admit team rx PRN Haldol and lorazepam 2 Acinetobacter UTI   Completed unasyn 3 ESRD= TTS HD L thigh graft/ Dialysis 12/5  Off schedule will dialyze in 12/7 4 Recent cath sepsis (pseudomonas) 5 SLE= plaquenil / ? Flare with trending Low plts and hgb drop / Admit team rx Prednisone 60mg  daily 6 SP CVA/ endocarditis ([place])/ hypercoag= on coumadin 7 Debil= In hosp rerhab Noted recom [facility] 8 SVT= on MTP, coumadin 9.Anemia =max darbq thurs HD , hgb 9.5 today worse/ transfusion / hold heparin with  hD / admit team work up ? 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

## 2015-03-06 NOTE — Progress Notes (Signed)
Nutrition Follow Up  DOCUMENTATION CODES:   Obesity unspecified  INTERVENTION:    Continue Boost Breeze po TID, each supplement provides 250 kcal and 9 grams of protein.   Continue Prostat po BID, each supplement provides 100 kcal and 15 grams of protein.   NUTRITION DIAGNOSIS:   Increased nutrient needs related to chronic illness as evidenced by estimated needs, ongoing  GOAL:   Patient will meet greater than or equal to 90% of their needs  MONITOR:   PO intake, Supplement acceptance, Labs, Weight trends, I & O's  ASSESSMENT:   39 y.o. 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 Per the rehab physician and nursing staff, the patient started stating that there was someone else in the room telling her what to do. This has been going on for the past 24 hours or so and seems to be getting worse. This is also accompanied by tachycardia and low grade fever.   Patient transferred from [facility] 12/5.  Patient continues on a Renal diet with 1200 ml fluid restriction.  PO intake variable at 25-50% per  flowsheet records.  Noted episode of vomiting 12/6.  Psychiatry consulted; note 12/5 reviewed.  Diet Order:  Diet renal with fluid restriction Fluid restriction:: 1200 mL Fluid; Room service appropriate?: Yes; Fluid consistency:: Thin  Skin:   (Incision on thigh)  Last BM:  12/7  Height:   Ht Readings from Last 1 Encounters:  02/22/15 5\' 3"  (1.6 m)    Weight:   Wt Readings from Last 1 Encounters:  03/05/15 183 lb 6.8 oz (83.2 kg)    Ideal Body Weight:  52.27 kg  BMI:  Body mass index is 32.5 kg/(m^2).  Estimated Nutritional Needs:   Kcal:  [id number]  Protein:  105-120 grams  Fluid:  Per MD  EDUCATION NEEDS:   No education needs identified at this time  Arthur Holms, RD, LDN Pager #: [phone] After-Hours Pager #: [phone]

## 2015-03-06 NOTE — Progress Notes (Signed)
ANTICOAGULATION CONSULT NOTE - Follow Up Consult  Pharmacy Consult for Warfarin Indication: Hx CVA and hypercoagulable state  Allergies  Allergen Reactions  . Food Swelling    Red peppers    Patient Measurements: Height: 5\' 3"  (160 cm) Weight: 183 lb 6.8 oz (83.2 kg) IBW/kg (Calculated) : 52.4  Vital Signs: Temp: 97.7 F (36.5 C) (12/07 0946) Temp Source: Axillary (12/07 0946) BP: 137/97 mmHg (12/07 1033) Pulse Rate: 99 (12/07 1033)  Labs:  Recent Labs  03/04/15 0542 03/04/15 2000 03/05/15 0942 03/05/15 1206 03/06/15 0357 03/06/15 0906  HGB 6.1* [id number]* [id number]* DUPLICATE REQUEST  SEE [id number]  --  9.5*  HCT 20.3* 0000000* [id number]* DUPLICATE REQUEST  SEE [id number]  --  28.4*  PLT <5* <5* 6* DUPLICATE REQUEST  SEE [id number]  --  17*  LABPROT 35.1*  --  28.2*  --  27.0*  --   INR 3.60*  --  2.69*  --  2.54*  --   CREATININE  --  2.49* 3.05*  --   --   --     Estimated Creatinine Clearance: 25.3 mL/min (by C-G formula based on Cr of 3.05).   Assessment: 15 YOF with ESRD on Coumadin for history of SLE, stroke, and hypercoagulable state. She had been on Coumadin 5 mg daily from 11/16-11/22/16, with gradual rise to therapeutic INR after 5 days. Subsequently held for potential procedure and restarted on 11/28. Warfarin on hold since 12/2 due to rise in INR, hgb drop and worsening thrombocytopenia. Warfarin x 1 dose was given on 12/6.    This patient was discussed with the IMTS today - there is some literature to state holding warfarin until platelets recover to >50 due to increased risk of bleeding and spontaneous hemorrhage. The team was okay with holding the warfarin for now. Pharmacy will monitor trends in platelets and plan to restart warfarin dosing when platelets recover to >50. INR therapeutic at 2.54 today.   Goal of Therapy:  Heparin level 0.3-0.7 units/ml Monitor platelets by anticoagulation protocol: Yes   Plan:  1. Hold warfarin for now 2. Will trend platelet recovery and  plan to resume when platelets >50 3. Will continue to monitor for signs and symptoms of bleeding  Alycia Rossetti, PharmD, BCPS Clinical Pharmacist Pager: [phone] 03/06/2015 10:47 AM

## 2015-03-06 NOTE — Progress Notes (Signed)
Physical Therapy Treatment Patient Details Name: [name] MRN: [id number] DOB: 01-08-1976 Today's Date: 03/06/2015    History of Present Illness Pt is a 39 y.o. F from CIR who has had several recent hospitalizations.  She had a Rt MCA infarction and developed pseudomonal bacteremia from Rt femoral HD line which was treated.  Was doing well in CIR until she acutely developed severe confusion and hallucinations following HD sessions, likely a result of sepsis from Lt ant thigh abscess. Pt's additional PMH includes lupus, fibromyalgia, anemia.    PT Comments    Pt admitted with above diagnosis. Pt currently with functional limitations due to balance and endurance deficits. Pt was able to get into chair today but needed +2 max assist.  Pt limited by left LE pain and weakness.  Pt also anxious.  Continue as pt able.  Pt will benefit from skilled PT to increase their independence and safety with mobility to allow discharge to the venue listed below.    Follow Up Recommendations  CIR;Supervision/Assistance - 24 hour     Equipment Recommendations  Wheelchair (measurements PT);Wheelchair cushion (measurements PT)    Recommendations for Other Services Rehab consult     Precautions / Restrictions Precautions Precautions: Fall Precaution Comments: pt with high anxiety about falling Restrictions Weight Bearing Restrictions: No    Mobility  Bed Mobility Overal bed mobility: Needs Assistance;+2 for physical assistance Bed Mobility: Rolling;Sidelying to Sit;Sit to Supine Rolling: Mod assist (with use of rail and HOB down) Sidelying to sit: Max assist (with use of rail and HOB 20 degrees)       General bed mobility comments: Required more physical assist today with bed mobility. Mod-Max assist with tactile cues to initiate roll and use of rail. Total A to scoot forward to EOB with use of pad.   Transfers Overall transfer level: Needs assistance Equipment used: 2 person hand held  assist Transfers: Sit to/from [employer] Sit to Stand: Max assist;+2 physical assistance;From elevated surface   Squat pivot transfers: Mod assist;Max assist;+2 physical assistance;From elevated surface     General transfer comment: Used pad and gait belt to assist pt to squat pivot to chair. Pt limited by her left LE pain and weakness.   Ambulation/Gait                 Stairs            Wheelchair Mobility    Modified Rankin (Stroke Patients Only)       Balance Overall balance assessment: Needs assistance Sitting-balance support: Bilateral upper extremity supported;Feet supported Sitting balance-Leahy Scale: Poor Sitting balance - Comments: varied between fair and poor. Pt sat EOB 8 minutes Postural control: Posterior lean Standing balance support: Bilateral upper extremity supported;During functional activity Standing balance-Leahy Scale: Zero Standing balance comment: Needs mod to max assist and cannot get fully upright                     Cognition Arousal/Alertness: Awake/alert Behavior During Therapy: Anxious Overall Cognitive Status: Impaired/Different from baseline Area of Impairment: Problem solving Orientation Level: Disoriented to;Time;Situation (she thought she was done with rehab) Current Attention Level: Focused Memory: Decreased short-term memory Following Commands: Follows one step commands inconsistently Safety/Judgement: Decreased awareness of safety;Decreased awareness of deficits Awareness: Intellectual Problem Solving: Slow processing;Requires verbal cues;Difficulty sequencing General Comments: Very confused.    Exercises General Exercises - Lower Extremity Ankle Circles/Pumps: AROM;Both;Supine;10 reps Long Arc Quad: Strengthening;Both;5 reps;Seated    General Comments General comments (skin  integrity, edema, etc.): HR 80-124 bpm, 146/83 pre BP, 135/56 once in chair.  96-100% O2 on RA with activity.        Pertinent Vitals/Pain Pain Assessment: Faces Faces Pain Scale: Hurts little more Pain Location: buttocks and left LE Pain Descriptors / Indicators: Aching;Sore;Grimacing Pain Intervention(s): Limited activity within patient's tolerance;Monitored during session;Repositioned  VSS above    Home Living                      Prior Function            PT Goals (current goals can now be found in the care plan section) Progress towards PT goals: Progressing toward goals    Frequency  Min 3X/week    PT Plan Current plan remains appropriate    Co-evaluation             End of Session Equipment Utilized During Treatment: Gait belt Activity Tolerance: Patient limited by fatigue Patient left: in chair;with call bell/phone within reach;with chair alarm set     Time: [id number] PT Time Calculation (min) (ACUTE ONLY): 15 min  Charges:  $Therapeutic Activity: 8-22 mins                    G CodesIrwin Brakeman F 2015/03/31, 1:11 PM [employer] Acute [facility] [phone] (pager)

## 2015-03-07 LAB — RENAL FUNCTION PANEL
ALBUMIN: 1.6 g/dL — AB (ref 3.5–5.0)
ANION GAP: 13 (ref 5–15)
BUN: 27 mg/dL — ABNORMAL HIGH (ref 6–20)
CALCIUM: 8.3 mg/dL — AB (ref 8.9–10.3)
CO2: 25 mmol/L (ref 22–32)
Chloride: 96 mmol/L — ABNORMAL LOW (ref 101–111)
Creatinine, Ser: 4.82 mg/dL — ABNORMAL HIGH (ref 0.44–1.00)
GFR calc non Af Amer: 10 mL/min — ABNORMAL LOW (ref >60)
GFR, EST AFRICAN AMERICAN: 12 mL/min — AB (ref >60)
Glucose, Bld: 91 mg/dL (ref 65–99)
PHOSPHORUS: 5.3 mg/dL — AB (ref 2.5–4.6)
Potassium: 4.5 mmol/L (ref 3.5–5.1)
SODIUM: 134 mmol/L — AB (ref 135–145)

## 2015-03-07 LAB — CBC WITH DIFFERENTIAL/PLATELET
BASOS ABS: 0 10*3/uL (ref 0.0–0.1)
Basophils Relative: 0 %
EOS ABS: 0 10*3/uL (ref 0.0–0.7)
Eosinophils Relative: 0 %
HCT: 27.6 % — ABNORMAL LOW (ref 36.0–46.0)
Hemoglobin: 9 g/dL — ABNORMAL LOW (ref 12.0–15.0)
LYMPHS PCT: 5 %
Lymphs Abs: 0.8 10*3/uL (ref 0.7–4.0)
MCH: 29.3 pg (ref 26.0–34.0)
MCHC: 32.6 g/dL (ref 30.0–36.0)
MCV: 89.9 fL (ref 78.0–100.0)
Monocytes Absolute: 0.8 10*3/uL (ref 0.1–1.0)
Monocytes Relative: 5 %
NEUTROS PCT: 90 %
Neutro Abs: 13.8 10*3/uL — ABNORMAL HIGH (ref 1.7–7.7)
PLATELETS: 5 10*3/uL — AB (ref 150–400)
RBC: 3.07 MIL/uL — AB (ref 3.87–5.11)
RDW: 16.5 % — AB (ref 11.5–15.5)
WBC: 15.4 10*3/uL — AB (ref 4.0–10.5)

## 2015-03-07 LAB — PROTIME-INR
INR: 3.11 — ABNORMAL HIGH (ref 0.00–1.49)
Prothrombin Time: 31.5 seconds — ABNORMAL HIGH (ref 11.6–15.2)

## 2015-03-07 MED ORDER — LIDOCAINE HCL (PF) 1 % IJ SOLN
5.0000 mL | INTRAMUSCULAR | Status: DC | PRN
  Filled 2015-03-07: qty 5

## 2015-03-07 MED ORDER — DARBEPOETIN ALFA 200 MCG/0.4ML IJ SOSY
PREFILLED_SYRINGE | INTRAMUSCULAR | Status: AC
Start: 2015-03-07 — End: 2015-03-07
  Administered 2015-03-07: 200 ug via INTRAVENOUS
  Filled 2015-03-07: qty 0.4

## 2015-03-07 MED ORDER — MIDODRINE HCL 5 MG PO TABS
ORAL_TABLET | ORAL | Status: AC
Start: 2015-03-07 — End: 2015-03-07
  Administered 2015-03-07: 10 mg via ORAL
  Filled 2015-03-07: qty 2

## 2015-03-07 MED ORDER — ALBUMIN HUMAN 25 % IV SOLN
INTRAVENOUS | Status: AC
Start: 2015-03-07 — End: 2015-03-07
  Administered 2015-03-07: 25 g via INTRAVENOUS
  Filled 2015-03-07: qty 100

## 2015-03-07 MED ORDER — ALBUMIN HUMAN 25 % IV SOLN
25.0000 g | Freq: Once | INTRAVENOUS | Status: AC
  Administered 2015-03-07: 25 g via INTRAVENOUS

## 2015-03-07 MED ORDER — SODIUM CHLORIDE 0.9 % IV SOLN
Freq: Once | INTRAVENOUS | Status: DC
Start: 2015-03-07 — End: 2015-03-10

## 2015-03-07 MED ORDER — SODIUM CHLORIDE 0.9 % IV SOLN: 100.0000 mL | INTRAVENOUS | Status: DC | PRN

## 2015-03-07 MED ORDER — HEPARIN SODIUM (PORCINE) 1000 UNIT/ML DIALYSIS
1000.0000 [IU] | INTRAMUSCULAR | Status: DC | PRN
  Filled 2015-03-07: qty 1

## 2015-03-07 MED ORDER — PENTAFLUOROPROP-TETRAFLUOROETH EX AERO: 1.0000 "application " | INHALATION_SPRAY | CUTANEOUS | Status: DC | PRN

## 2015-03-07 MED ORDER — ALTEPLASE 2 MG IJ SOLR
2.0000 mg | Freq: Once | INTRAMUSCULAR | Status: DC | PRN
  Filled 2015-03-07: qty 2

## 2015-03-07 MED ORDER — LIDOCAINE-PRILOCAINE 2.5-2.5 % EX CREA: 1.0000 "application " | TOPICAL_CREAM | CUTANEOUS | Status: DC | PRN

## 2015-03-07 NOTE — Progress Notes (Signed)
Noted patient had difficulty swallowing pills and requested for meds to be crushed. Pt states she has been having difficulty swallowing for the last week. Informed Dr Merrilyn Puma.

## 2015-03-07 NOTE — Progress Notes (Addendum)
Bleeding noted from AVF to L thigh. Pressure applied. Pt is awake and stable but went ahead and informed rapid response RN of bleeding. Spoke with Dr. Charlynn Grimes and notified of situation.

## 2015-03-07 NOTE — Progress Notes (Signed)
I was paged by the RN for patient's continual bleeding from the AVG.  Patient examined at bedside, and she is stable with normal vital signs. She has 2 sites on her left thigh where there is some oozing of the blood. Per RN this has been going on for > 1 hour.  Patient has just received 1 units of platelets   Plan I have ordered post-tranfusion CBC I have asked the nurse to administer the second unit of platelets that was already ordered Also asked the RN to continue applying pressure on the site  They will page me with any change in situation.  RN wanted [place] to transfer the patient to a step-down unit as they believe they do not think patient is a candidate on that floor due to this issue.- We will place the transfer order   Signed Vernie Murders PGY1

## 2015-03-07 NOTE — Procedures (Signed)
I have seen and examined this patient and agree with the plan of care. Patient seen on dialysis this morning with no complaints  [name] W 03/07/2015, 10:09 AM

## 2015-03-07 NOTE — Progress Notes (Signed)
Hemodialysis- BP dropping during HD. Received midodrine. Will try albumin per Dr. Justin Mend. Continue to monitor patient.

## 2015-03-07 NOTE — Progress Notes (Signed)
Subjective: No acute events overnight. Denies hallucinations. Has not had any recurrent SOB or palpitations, says her energy is improved. Denies recurrent blood streaked rhinorrhea. Denies chest pain, cough, weakness/lightheadedness, abdominal pain, any changes in urination or bowel function, including hematuria, melena or hematochezia, headache, neck pain, vision changes, or any other new symptoms at this time.. She still endorses L leg pain which is unchanged from previous.  Objective: Vital signs in last 24 hours: Filed Vitals:   03/07/15 1100 03/07/15 1125 03/07/15 1155 03/07/15 1219  BP: 104/58 105/61 98/52 118/70  Pulse: 81 81 77 71  Temp:    97.8 F (36.6 C)  TempSrc:      Resp:    16  Height:      Weight:      SpO2:    100%   Weight change: -1 lb 5.2 oz (-0.6 kg)  Intake/Output Summary (Last 24 hours) at 03/07/15 1335 Last data filed at 03/07/15 1219  Gross per 24 hour  Intake      0 ml  Output   1777 ml  Net  -1777 ml   General Apperance: NAD HEENT: Normocephalic, atraumatic, PERRL, EOMI, anicteric sclera Neck: Supple, trachea midline Lungs: Clear to auscultation bilaterally. No wheezes, rhonchi or rales. Heart: Regular rate and rhythm Abdomen: Soft, nontender, nondistended, no rebound/guarding Extremities: Warm and well perfused, 2+ edema of the left thigh around graft site, 1+ distal LLE edema. Scattered ecchymoses in the R antecubital fossa. Pulses: 1+ throughout Skin: No rashes or lesions Neurologic: Alert and oriented x 3. No gross deficits. Psych: Improved affect, still flat but less so than previous exams.   Lab Results: Basic Metabolic Panel:  Recent Labs Lab 03/05/15 0942 03/07/15 0811  NA 136 134*  K 4.1 4.5  CL 98* 96*  CO2 25 25  GLUCOSE 68 91  BUN 11 27*  CREATININE 3.05* 4.82*  CALCIUM 8.0* 8.3*  PHOS  --  5.3*   Liver Function Tests:  Recent Labs Lab 03/07/15 0811  ALBUMIN 1.6*   CBC:  Recent Labs Lab 03/06/15 0906  03/07/15 0811  WBC 18.3* 15.4*  NEUTROABS 15.6* 13.8*  HGB 9.5* 9.0*  HCT 28.4* 27.6*  MCV 89.0 89.9  PLT 17* 5*   Coagulation:  Recent Labs Lab 03/04/15 0542 03/05/15 0942 03/06/15 0357 03/07/15 0811  LABPROT 35.1* 28.2* 27.0* 31.5*  INR 3.60* 2.69* 2.54* 3.11*    Micro Results: Recent Results (from the past 240 hour(s))  Culture, blood (routine x 2)     Status: None   Collection Time: 02/27/15  3:41 PM  Result Value Ref Range Status   Specimen Description BLOOD RIGHT HAND  Final   Special Requests IN PEDIATRIC BOTTLE  2CC  Final   Culture NO GROWTH 5 DAYS  Final   Report Status 03/04/2015 FINAL  Final  Culture, blood (routine x 2)     Status: None   Collection Time: 02/27/15  5:10 PM  Result Value Ref Range Status   Specimen Description BLOOD RIGHT HAND  Final   Special Requests IN PEDIATRIC BOTTLE  3CC  Final   Culture NO GROWTH 5 DAYS  Final   Report Status 03/04/2015 FINAL  Final  MRSA PCR Screening     Status: None   Collection Time: 03/04/15  8:02 PM  Result Value Ref Range Status   MRSA by PCR NEGATIVE NEGATIVE Final    Comment:        The GeneXpert MRSA Assay (FDA approved for NASAL specimens  only), is one component of a comprehensive MRSA colonization surveillance program. It is not intended to diagnose MRSA infection nor to guide or monitor treatment for MRSA infections.    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   Acute psychosis, Depression: She had acute confusion and hallucinations secondary UTI. Her symptoms improved but worsened 12/9. 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 Low suspicion for Lupus cerebritis. She remains afebrile. Repeat blood cultures on 11/30 NGTD. These symptoms continue to resolve with olanzapine. -D/c'ed Unasyn on 12/5  2/2 thrombocytopenia - received 10 days of abx (initially to complete 14 day course from 11/25 (end date: 12/9)) -Continue olanzapine 5mg  BID, haldol 0.5mg  PO PRN for agitation -Monitor QTc -Continue duloxetine 30mg  QD. -Consulted psychiatry, appreciate recs  Acute on chronic anemia and thrombocytopenia: Hgb down to 7.1 on 12/3 from 8.9. Further decreased to 6.1 on 12/5 with Plt count trending down to 7 as well. No signs or symptoms of acute bleeding. Pt is symptomatic from the anemia with shortness of breath and palpitations. Most likely 2/2 lupus flare as pt has history of similar episodes but Unasyn is also a potential precipitant. 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 LDH only mildly elevated with inappropriately low retic, likely not hemolyzing currently and may be more of a hypoproliferative process. C3 is decreased, C4 normal. dsDNA 10. Likely 2/2 lupus flare. Currently not responding well to steroid treatment but without over s/s bleeding. 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 -Transfused 2U PRBCs and 1U plts on 12/6. 1U plts on 12/7. Transfuse PRN -Discussed case with Dr. Norva Riffle in Rheumatology abt this case - agrees with current course - consulted hematology; appreciate recs -F/u CBCs -Coumadin per pharm  ESRD on HD, s/p Left thigh AVG:AVG placed 01/04/2015. Back on her outpatient TTS schedule. She has some left leg edema likely 2/2 AVG. Venous duplex with post surgical changes and no DVT. -Midodrine 10mg  BID -Nephrology following, appreciate recs -HD per nephrology -Percocet 5/325mg  q 6 hours prn pain  Hx recent R. MCA Embolic CVA: PT recommending CIR. -Daily PT/INR -Coumadin per pharm -Continue PT  SLE: Continue Plaquenil Hx SVT/Tachycardia: Continue Toprol-XL 25mg   FEN: Renal VTE ppx: Coumadin  FULL CODE  Dispo: Will likely go to SNF or [facility] upon discharge.  Anticipated  discharge in approximately 2-3 day(s).   The patient does have a current PCP Lin Landsman, MD) and does not need an [facility] hospital follow-up appointment after discharge.  The patient does not have transportation limitations that hinder transportation to clinic appointments.   LOS: 16 days   Norval Gable, MD 03/07/2015, 1:35 PM

## 2015-03-07 NOTE — Progress Notes (Signed)
CSW spoke with pt concerning SNF as alternate to CIR- pt is not agreeable and wants to go home with home health- reports that [facility].  CSW informed RNCM  CSW signing off  Domenica Reamer, [employer] Worker [phone]

## 2015-03-07 NOTE — Progress Notes (Signed)
[facility] ROUNDING NOTE   Subjective:   Interval History: Patient was seen on dialysis today and has no complaints  Objective:  Vital signs in last 24 hours:  Temp:  [97.4 F (36.3 C)-97.9 F (36.6 C)] 97.5 F (36.4 C) (12/08 0816) Pulse Rate:  [72-99] 74 (12/08 1009) Resp:  [15-21] 15 (12/08 0816) BP: (82-151)/(53-97) 85/53 mmHg (12/08 1009) SpO2:  [89 %-100 %] 100 % (12/08 0816) Weight:  [80.3 kg (177 lb 0.5 oz)-82.6 kg (182 lb 1.6 oz)] 80.3 kg (177 lb 0.5 oz) (12/08 0816)  Weight change: -0.6 kg (-1 lb 5.2 oz) Filed Weights   03/05/15 0735 03/07/15 0500 03/07/15 0816  Weight: 83.2 kg (183 lb 6.8 oz) 82.6 kg (182 lb 1.6 oz) 80.3 kg (177 lb 0.5 oz)    Intake/Output: I/O last 3 completed shifts: In: 323 [P.O.:320; I.V.:3] Out: -    Intake/Output this shift:     General: [name] awoken from sleep , NAD Heart: RRR no rub or gallop Lungs:CTA bilat ,nonlabored breathing Abdomen: obese soft , NT, ND Extremities: L thigh swelling and L pedal edema trace/ No r pedal edema  Dialysis Access: L fem AVGG   Basic Metabolic Panel:  Recent Labs Lab 03/02/15 0729 03/04/15 2000 03/05/15 0942 03/07/15 0811  NA 138 137 136 134*  K 3.8 3.1* 4.1 4.5  CL 100* 98* 98* 96*  CO2 30 30 25 25   GLUCOSE 86 66 68 91  BUN 17 8 11  27*  CREATININE 4.59* 2.49* 3.05* 4.82*  CALCIUM 8.2* 7.5* 8.0* 8.3*  PHOS  --   --   --  5.3*    Liver Function Tests:  Recent Labs Lab 03/07/15 0811  ALBUMIN 1.6*   No results for input(s): LIPASE, AMYLASE in the last 168 hours. No results for input(s): AMMONIA in the last 168 hours.  CBC:  Recent Labs Lab 03/04/15 2000 03/05/15 0942 03/05/15 1206 03/06/15 0906 03/07/15 0811  WBC 9.2 [id number] DUPLICATE REQUEST  SEE [id number] 18.3* PENDING  NEUTROABS  --   --  8.2* 15.6* PENDING  HGB [id number]* [id number]* DUPLICATE REQUEST  SEE [id number] 9.5* 9.0*  HCT 0000000* [id number]* DUPLICATE REQUEST  SEE [id number] 28.4* 27.6*  MCV [id number] DUPLICATE  REQUEST  SEE [id number] 89.0 [id number]  PLT <5* 6* DUPLICATE REQUEST  SEE [id number] 17* PENDING    Cardiac Enzymes: No results for input(s): CKTOTAL, CKMB, CKMBINDEX, TROPONINI in the last 168 hours.  BNP: Invalid input(s): POCBNP  CBG: No results for input(s): GLUCAP in the last 168 hours.  Microbiology: Results for orders placed or performed during the hospital encounter of 02/19/15  Urine culture     Status: None   Collection Time: 02/20/15 12:35 PM  Result Value Ref Range Status   Specimen Description URINE, CATHETERIZED  Final   Special Requests NONE  Final   Culture   Final    >=100,000 COLONIES/mL VANCOMYCIN RESISTANT ENTEROCOCCUS >=100,000 COLONIES/mL ACINETOBACTER CALCOACETICUS/BAUMANNII COMPLEX    Report Status 02/24/2015 FINAL  Final   Organism ID, Bacteria ACINETOBACTER CALCOACETICUS/BAUMANNII COMPLEX  Final   Organism ID, Bacteria VANCOMYCIN RESISTANT ENTEROCOCCUS  Final      Susceptibility   Acinetobacter calcoaceticus/baumannii complex - MIC*    CEFTAZIDIME >=64 RESISTANT Resistant     CEFTRIAXONE >=64 RESISTANT Resistant     CIPROFLOXACIN >=4 RESISTANT Resistant     GENTAMICIN 2 SENSITIVE Sensitive     IMIPENEM 2 SENSITIVE Sensitive     PIP/TAZO 32 INTERMEDIATE Intermediate     TRIMETH/SULFA >=  320 RESISTANT Resistant     AMPICILLIN/SULBACTAM 4 SENSITIVE Sensitive     * >=100,000 COLONIES/mL ACINETOBACTER CALCOACETICUS/BAUMANNII COMPLEX   Vancomycin resistant enterococcus - MIC*    AMPICILLIN <=2 SENSITIVE Sensitive     LEVOFLOXACIN >=8 RESISTANT Resistant     NITROFURANTOIN <=16 SENSITIVE Sensitive     VANCOMYCIN >=32 RESISTANT Resistant     LINEZOLID 2 SENSITIVE Sensitive     * >=100,000 COLONIES/mL VANCOMYCIN RESISTANT ENTEROCOCCUS  Culture, blood (routine x 2)     Status: None   Collection Time: 02/20/15  3:30 PM  Result Value Ref Range Status   Specimen Description BLOOD VENOUS  Final   Special Requests BOTTLES DRAWN AEROBIC AND ANAEROBIC 10ML  Final    Culture NO GROWTH 5 DAYS  Final   Report Status 02/25/2015 FINAL  Final  Culture, blood (routine x 2)     Status: None   Collection Time: 02/20/15  4:16 PM  Result Value Ref Range Status   Specimen Description BLOOD ARTERIAL LINE  Final   Special Requests BOTTLES DRAWN AEROBIC AND ANAEROBIC 10ML  Final   Culture NO GROWTH 5 DAYS  Final   Report Status 02/25/2015 FINAL  Final  Culture, blood (routine x 2)     Status: None   Collection Time: 02/21/15  8:36 PM  Result Value Ref Range Status   Specimen Description BLOOD RIGHT HAND  Final   Special Requests IN PEDIATRIC BOTTLE 3CC  Final   Culture NO GROWTH 5 DAYS  Final   Report Status 02/26/2015 FINAL  Final  Culture, blood (routine x 2)     Status: None   Collection Time: 02/21/15  9:21 PM  Result Value Ref Range Status   Specimen Description BLOOD RIGHT THUMB  Final   Special Requests IN PEDIATRIC BOTTLE 1.5CC  Final   Culture NO GROWTH 5 DAYS  Final   Report Status 02/26/2015 FINAL  Final  Culture, blood (routine x 2)     Status: None   Collection Time: 02/27/15  3:41 PM  Result Value Ref Range Status   Specimen Description BLOOD RIGHT HAND  Final   Special Requests IN PEDIATRIC BOTTLE  2CC  Final   Culture NO GROWTH 5 DAYS  Final   Report Status 03/04/2015 FINAL  Final  Culture, blood (routine x 2)     Status: None   Collection Time: 02/27/15  5:10 PM  Result Value Ref Range Status   Specimen Description BLOOD RIGHT HAND  Final   Special Requests IN PEDIATRIC BOTTLE  3CC  Final   Culture NO GROWTH 5 DAYS  Final   Report Status 03/04/2015 FINAL  Final  MRSA PCR Screening     Status: None   Collection Time: 03/04/15  8:02 PM  Result Value Ref Range Status   MRSA by PCR NEGATIVE NEGATIVE Final    Comment:        The GeneXpert MRSA Assay (FDA approved for NASAL specimens only), is one component of a comprehensive MRSA colonization surveillance program. It is not intended to diagnose MRSA infection nor to guide or monitor  treatment for MRSA infections.     Coagulation Studies:  Recent Labs  03/05/15 0942 03/06/15 0357 03/07/15 0811  LABPROT 28.2* 27.0* 31.5*  INR 2.69* 2.54* 3.11*    Urinalysis: No results for input(s): COLORURINE, LABSPEC, PHURINE, GLUCOSEU, HGBUR, BILIRUBINUR, KETONESUR, PROTEINUR, UROBILINOGEN, NITRITE, LEUKOCYTESUR in the last 72 hours.  Invalid input(s): APPERANCEUR    Imaging: No results found.   Medications:     .  sodium chloride   Intravenous Once  . sodium chloride   Intravenous Once  . sodium chloride   Intravenous Once  . B-complex with vitamin C  1 tablet Oral Daily  . Darbepoetin Alfa      . Darbepoetin Alfa  200 mcg Intravenous Q Thu-HD  . dexamethasone  40 mg Oral Daily  . DULoxetine  40 mg Oral Daily  . feeding supplement  1 Container Oral TID BM  . feeding supplement (PRO-STAT SUGAR FREE 64)  30 mL Oral BID  . hydroxychloroquine  400 mg Oral Daily  . metoprolol succinate  25 mg Oral Daily  . midodrine  10 mg Oral BID WC  . multivitamin  1 tablet Oral BID  . OLANZapine zydis  10 mg Oral BID PC  . pantoprazole  40 mg Oral Daily  . sodium chloride  3 mL Intravenous Q12H  . Warfarin - Pharmacist Dosing Inpatient   Does not apply q1800   sodium chloride, sodium chloride, acetaminophen, acetaminophen, alteplase, haloperidol, heparin, Influenza vac split quadrivalent PF, lidocaine (PF), lidocaine-prilocaine, ondansetron **OR** ondansetron (ZOFRAN) IV, oxyCODONE-acetaminophen, pentafluoroprop-tetrafluoroeth, pneumococcal 23 valent vaccine, polyethylene glycol, sodium chloride  Assessment/ Plan:  OP HD= [facility] TTS 4 hr 87.5kg 2/2 bath L thigh AVG Hep 2600 Aranesp 200/week  1 AMS / acute psychosis - on zyprexa, Cymbalta / admit team rx PRN Haldol and lorazepam 2 Acinetobacter UTI Completed unasyn 3 ESRD= TTS HD L thigh graft/Dialysis 12/8 4 Recent cath sepsis (pseudomonas) 5 SLE= plaquenil / ? Flare with trending Low plts and hgb drop / Admit team  rx Prednisone 60mg  daily 6 SP CVA/ endocarditis ([place])/ hypercoag= on coumadin 7 Debil= In hosp rerhab Noted recom [place] 8 SVT= on MTP, coumadin 9.Anemia =max darbq thurs HD , hgb 9.0 today worse/ transfusion / hold heparin with hD / admit team work up ? 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

## 2015-03-07 NOTE — Progress Notes (Signed)
Rapid response called for pt uncontrolled bleeding from pt left thigh graft site. Spoke with April, Rapid Response RN. April to come to bedside to assess pt.

## 2015-03-07 NOTE — Progress Notes (Signed)
Pt having continuous bleeding form left thigh graft sites. Valetta Fuller, primary RN and Sonia Baller, Charge RN holding pressure x1 hour plus. Rapid called. Dr. Tiburcio Pea paged. Dr. Tiburcio Pea to come to bedside to assess pt.

## 2015-03-07 NOTE — Progress Notes (Signed)
ANTICOAGULATION CONSULT NOTE - Follow Up Consult  Pharmacy Consult for Warfarin Indication: Hx CVA and hypercoagulable state  Allergies  Allergen Reactions  . Food Swelling    Red peppers    Patient Measurements: Height: 5\' 3"  (160 cm) Weight: 177 lb 0.5 oz (80.3 kg) (1 blanket, 1 pillow) IBW/kg (Calculated) : 52.4  Vital Signs: Temp: 97.5 F (36.4 C) (12/08 0816) Temp Source: Oral (12/08 0816) BP: 98/52 mmHg (12/08 1155) Pulse Rate: 77 (12/08 1155)  Labs:  Recent Labs  03/04/15 2000 03/05/15 0942 03/05/15 1206 03/06/15 0357 03/06/15 0906 03/07/15 0811  HGB [id number]* [id number]* DUPLICATE REQUEST  SEE [id number]  --  9.5* 9.0*  HCT 0000000* [id number]* DUPLICATE REQUEST  SEE [id number]  --  28.4* 27.6*  PLT <5* 6* DUPLICATE REQUEST  SEE [id number]  --  17* 5*  LABPROT  --  28.2*  --  27.0*  --  31.5*  INR  --  2.69*  --  2.54*  --  3.11*  CREATININE 2.49* 3.05*  --   --   --  4.82*    Estimated Creatinine Clearance: 15.7 mL/min (by C-G formula based on Cr of 4.82).   Assessment: 59 YOF with ESRD on Coumadin for history of SLE, stroke, and hypercoagulable state. She had been on Coumadin 5 mg daily from 11/16-11/22/16, with gradual rise to therapeutic INR after 5 days. Subsequently held for potential procedure and restarted on 11/28. Warfarin on hold since 12/2 due to rise in INR, hgb drop and worsening thrombocytopenia. Warfarin x 1 dose was given on 12/6.    This patient was discussed with the IMTS today - there is some literature to state holding warfarin until platelets recover to >50 due to increased risk of bleeding and spontaneous hemorrhage. The team was okay with holding the warfarin for now. Pharmacy will monitor trends in platelets and plan to restart warfarin dosing when platelets recover to >50. INR therapeutic at 3.11 today.   Goal of Therapy:  Heparin level 0.3-0.7 units/ml Monitor platelets by anticoagulation protocol: Yes   Plan:  1. Hold warfarin for now 2. Will trend platelet  recovery and plan to resume when platelets >50 3. Will continue to monitor for signs and symptoms of bleeding  Thank you Anette Guarneri, PharmD [phone] 03/07/2015 12:20 PM

## 2015-03-07 NOTE — Care Management Note (Signed)
Case Management Note  Patient Details  Name: [name] MRN: [id number] Date of Birth: 10-19-1975  Subjective/Objective:     Date:03/07/15 Spoke with patient at the bedside.  Introduced self as Tourist information centre manager and explained role in discharge planning and how to be reached.  Verified patient lives in town, alone with mom and her kids. Has DME rolling walker and a wheelchair.  Expressed potential need for no other DME.  Verified patient anticipates to go home with family, refuses SNF at time of discharge and will have full-time supervision by family at this time to best of their knowledge.  Patient  denied needing help with their medication.  Patient  is driven by family members to MD appointments.  Verified patient has PCP Lin Landsman.  Patient refusing SNF at this time and would like to go home with [facility] services, she has had [facility] in the past and would like to continue with them.  On last admit patient had a stroke and was able to get physical therapy, but on this admit she has not qualifying dx for hhpt, she would be able to get a [facility],  Referral made to [facility] for [facility] and aide to [facility].  Soc will begin 24-48 hrs post dc.    Plan: CM will continue to follow for discharge planning and [facility] resources.                Action/Plan:   Expected Discharge Date:                  Expected Discharge Plan:  [place]  In-House Referral:     Discharge planning Services  CM Consult  Post Acute Care Choice:  Home Health Choice offered to:  Patient  DME Arranged:    DME Agency:     HH Arranged:  RN, Nurse's Aide [place] Agency:  [place]  Status of Service:  Completed, signed off  Medicare Important Message Given:    Date Medicare IM Given:    Medicare IM give by:    Date Additional Medicare IM Given:    Additional Medicare Important Message give by:     If discussed at [place] of Stay Meetings, dates discussed:    Additional Comments:  Zenon Mayo, RN 03/07/2015, 2:16 PM

## 2015-03-07 NOTE — Progress Notes (Signed)
Hemodialysis=  Increased bleeding post HD. Pressure held to each needle site post tx for >20 minutes. Bandages applied and reinforced with tape. Dry and intact. Discussed with pt importance of keeping an eye on her leg for the next few hours. Report given to primary RN.

## 2015-03-08 ENCOUNTER — Inpatient Hospital Stay (HOSPITAL_COMMUNITY): Payer: Medicaid Other

## 2015-03-08 DIAGNOSIS — M3219 Other organ or system involvement in systemic lupus erythematosus: Secondary | ICD-10-CM

## 2015-03-08 LAB — CBC
HCT: 22.5 % — ABNORMAL LOW (ref 36.0–46.0)
Hemoglobin: 7.4 g/dL — ABNORMAL LOW (ref 12.0–15.0)
MCH: 29.8 pg (ref 26.0–34.0)
MCHC: 32.9 g/dL (ref 30.0–36.0)
MCV: 90.7 fL (ref 78.0–100.0)
PLATELETS: 11 10*3/uL — AB (ref 150–400)
RBC: 2.48 MIL/uL — AB (ref 3.87–5.11)
RDW: 16.5 % — AB (ref 11.5–15.5)
WBC: 14.8 10*3/uL — AB (ref 4.0–10.5)

## 2015-03-08 LAB — DIC (DISSEMINATED INTRAVASCULAR COAGULATION)PANEL
INR: 2.64 — ABNORMAL HIGH (ref 0.00–1.49)
Platelets: 7 10*3/uL — CL (ref 150–400)
Prothrombin Time: 27.8 seconds — ABNORMAL HIGH (ref 11.6–15.2)

## 2015-03-08 LAB — DIC (DISSEMINATED INTRAVASCULAR COAGULATION) PANEL
APTT: 41 s — AB (ref 24–37)
D DIMER QUANT: 8.38 ug{FEU}/mL — AB (ref 0.00–0.50)
FIBRINOGEN: 205 mg/dL (ref 204–475)
SMEAR REVIEW: NONE SEEN

## 2015-03-08 LAB — PROTIME-INR
INR: 2.28 — ABNORMAL HIGH (ref 0.00–1.49)
PROTHROMBIN TIME: 24.9 s — AB (ref 11.6–15.2)

## 2015-03-08 MED ORDER — NEPRO/CARBSTEADY PO LIQD
237.0000 mL | Freq: Two times a day (BID) | ORAL | Status: DC
  Administered 2015-03-10: 237 mL via ORAL
  Filled 2015-03-08 (×3): qty 237

## 2015-03-08 MED ORDER — ROMIPLOSTIM 250 MCG ~~LOC~~ SOLR
2.0000 ug/kg | Freq: Once | SUBCUTANEOUS | Status: AC
  Administered 2015-03-08: 155 ug via SUBCUTANEOUS
  Filled 2015-03-08: qty 0.31

## 2015-03-08 MED ORDER — METOPROLOL SUCCINATE ER 25 MG PO TB24
12.5000 mg | ORAL_TABLET | Freq: Every day | ORAL | Status: DC
  Administered 2015-03-10 – 2015-03-20 (×10): 12.5 mg via ORAL
  Filled 2015-03-08 (×12): qty 1

## 2015-03-08 MED ORDER — IMMUNE GLOBULIN (HUMAN) 10 GM/100ML IV SOLN
400.0000 mg/kg | INTRAVENOUS | Status: AC
  Administered 2015-03-08 – 2015-03-12 (×5): 30 g via INTRAVENOUS
  Filled 2015-03-08 (×5): qty 300

## 2015-03-08 MED ORDER — DIPHENHYDRAMINE HCL 50 MG/ML IJ SOLN
25.0000 mg | Freq: Once | INTRAMUSCULAR | Status: AC
  Administered 2015-03-08: 25 mg via INTRAVENOUS
  Filled 2015-03-08: qty 1

## 2015-03-08 MED ORDER — METOPROLOL SUCCINATE ER 25 MG PO TB24: 12.5000 mg | ORAL_TABLET | Freq: Every day | ORAL | Status: DC

## 2015-03-08 MED ORDER — NYSTATIN 100000 UNIT/ML MT SUSP
5.0000 mL | Freq: Four times a day (QID) | OROMUCOSAL | Status: AC
  Administered 2015-03-08 – 2015-03-09 (×3): 500000 [IU] via ORAL
  Filled 2015-03-08 (×3): qty 5

## 2015-03-08 NOTE — Progress Notes (Signed)
NURSING PROGRESS NOTE  [name] [id number] Transfer Data: 03/08/2015 7:53 PM Attending Provider: Aldine Contes, MD [id number], MD Code Status: Full   [name] is a 39 y.o. female patient transferred from 3S  -No acute distress noted.  -No complaints of shortness of breath.  -No complaints of chest pain.    Last Documented Vital Signs: Blood pressure 144/104, pulse 79, temperature 98.5 F (36.9 C), temperature source Oral, resp. rate 23, height 5\' 3"  (1.6 m), weight 78.6 kg (173 lb 4.5 oz), last menstrual period 01/21/2015, SpO2 94 %.  IV Fluids:  IV in place, occlusive dsg intact without redness, IV cath upper arm right, condition patent and no redness none.   Allergies:  Food  Past Medical History:   has a past medical history of Hypertension; Cardiac arrhythmia; SVT (supraventricular tachycardia) ([place]); Fainting; Shortness of breath; Chest pain at rest; Lupus (systemic lupus erythematosus) ([place]); GERD (gastroesophageal reflux disease); Fibromyalgia; Irritable bowel syndrome (IBS); Hypercholesterolemia; Heart murmur; TIA (transient ischemic attack) (12/21/2014); Sleep apnea; Anemia; History of blood transfusion ("several"); Daily headache; Arthritis; Anxiety; ESRD (end stage renal disease) on dialysis ([place]) (since 12/07/2014); Stroke [facility]); and History of vascular access device.  Past Surgical History:   has past surgical history that includes Cesarean section (2001; 2010); Cervical biopsy (2013); Supraventricular tachycardia ablation (07/21/12 ); Cardiac catheterization (N/A, 12/14/2014); Insertion hybrid anteriovenous gortex graft (Left, 12/18/2014); Ligation of arteriovenous  fistula (Left, 12/19/2014); Appendectomy (2011); Tubal ligation (2010); TEE without cardioversion (N/A, 12/25/2014); AV fistula placement (Left, 01/04/2015); and TEE without cardioversion (N/A, 01/30/2015).  Social History:   reports that she has never smoked. She has never used smokeless tobacco. She  reports that she does not drink alcohol or use illicit drugs.  Skin: Stage 2 sacrum  Patient/Family orientated to room. Information packet given to patient/family. Admission inpatient armband information verified with patient/family to include name and date of birth and placed on patient arm. Side rails up x 2, fall assessment and education completed with patient/family. Patient/family able to verbalize understanding of risk associated with falls and verbalized understanding to call for assistance before getting out of bed. Call light within reach. Patient/family able to voice and demonstrate understanding of unit orientation instructions.

## 2015-03-08 NOTE — Progress Notes (Signed)
Patient was transferred to 3S room 15. Dustin RN, was aware that patient needs to have dressing on left leg taken off in the morning to check for complications.

## 2015-03-08 NOTE — Consult Note (Signed)
[place]  [phone] [phone]     ID: [name] DOB: Apr 01, 1975  MR#: [id number]  PNT#:[id number]  Patient Care Team: Lin Landsman, MD as PCP - General (Family Medicine) PCP: Kristine Garbe, MD GYN: SU:  OTHER MD: Edrick Oh MD, Ward Chatters MD, Cristopher Peru MD  CHIEF COMPLAINT: severe thrombocytopenia, SLE  CURRENT TREATMENT:    HISTORY OF PRESENT ILLNESS: [name]'s history of lupus dates back to 11/10/2012 when she was admitted with acute non-oliguric renal failure, rash, anemia, and leukopenia. Nephrology noted microhematuria and proteinuria. 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 She has been followed closely by renal, with subsequent biopsies June and August 2016 and was treated with CellCept and steroids but with progressive disease she has been on hemodialysis since September 2016. She underwent left upper arm hybrid dialysis graft placement 95/11/3265 complicated by steal, then the left thigh (femoral loop AV Gore-Tex) graft 01/04/2015.  INTERVAL HISTORY: Her case was also presented in the multidisciplinary breast cancer conference that same morning. At that time a preliminary plan was proposed:  REVIEW OF SYSTEMS:  PAST MEDICAL HISTORY: Past Medical History  Diagnosis Date  . Hypertension   . Cardiac arrhythmia   . SVT (supraventricular tachycardia) ([place])     "today, last week, 2 wk ago; maybe 1 month ago, etc; started w/in last 3-4 yrs"(07/20/2012)  . Fainting     "~ 1 month ago; probably related to SVT" (07/20/2012)  . Shortness of breath     "related to SVT episodes" (07/20/2012)  . Chest pain at rest     "related to SVT" (07/20/2012)  . Lupus (systemic lupus erythematosus) ([place])   . GERD  (gastroesophageal reflux disease)   . Fibromyalgia   . Irritable bowel syndrome (IBS)   . Hypercholesterolemia   . Heart murmur     "small" (12/25/2014)  . TIA (transient ischemic attack) 12/21/2014  . Sleep apnea     "don't wear mask anymore" (12/25/2014)  . Anemia   . History of blood transfusion "several"    "related to low counts"  . Daily headache   . Arthritis     "hips" (12/25/2014)  . Anxiety     "sometimes; don't take anything for it" (12/25/2014)  . ESRD (end stage renal disease) on dialysis ([place]) since 12/07/2014    Diagnosed Aug 2014 w SLE nephritis DPGN by biopsy, rx cellcept/ steroids.  Repeat biopsy early 2016 membranous w/o activity so meds weaned off. Big flare Aug '16 creat 6, 3rd biopsy DPGN, meds resumed. Ended up starting HD Sept 2016  . Stroke ([place])   . History of vascular access device     2016: Sept 9  R IJ cath per IR.  Sept 20 not candidate for fistula due to disease veins, had LUA hybrid graft placed. Sept 21 - steal syndrome, LUA AVG ligated. Oct 7 - new left femoral Gore-tex loop graft per [facility]. Oct 29 - removal R IJ tunneled HD cath    PAST SURGICAL HISTORY: Past Surgical History  Procedure Laterality Date  . Cesarean section  2001; 2010  . Cervical biopsy  2013  . Supraventricular tachycardia ablation  07/21/12     Dr. Cristopher Peru   . Peripheral vascular catheterization N/A 12/14/2014    Procedure: Upper Extremity Venography;  Surgeon:  Elam Dutch, MD;  Location: [facility];  Service: Cardiovascular;  Laterality: N/A;  . Insertion hybrid anteriovenous gortex graft Left 12/18/2014    Procedure: INSERTION GORE HYBRID ARTERIOVENOUS GRAFT LEFT AXILLO-BRACHIAL.;  Surgeon: Serafina Mitchell, MD;  Location: [facility] OR;  Service: Vascular;  Laterality: Left;  . Ligation of arteriovenous  fistula Left 12/19/2014    Procedure: LIGATION OF FISTULA;  Surgeon: Elam Dutch, MD;  Location: [place];  Service: Vascular;  Laterality: Left;  . Appendectomy  2011  .  Tubal ligation  2010  . Tee without cardioversion N/A 12/25/2014    Procedure: TRANSESOPHAGEAL ECHOCARDIOGRAM (TEE);  Surgeon: Sueanne Margarita, MD;  Location: [place];  Service: Cardiovascular;  Laterality: N/A;  . Av fistula placement Left 01/04/2015    Procedure: INSERTION OF ARTERIOVENOUS (AV) GORE-TEX GRAFT THIGH;  Surgeon: Rosetta Posner, MD;  Location: [place];  Service: Vascular;  Laterality: Left;  . Tee without cardioversion N/A 01/30/2015    Procedure: TRANSESOPHAGEAL ECHOCARDIOGRAM (TEE);  Surgeon: Lelon Perla, MD;  Location: [facility]COPY;  Service: Cardiovascular;  Laterality: N/A;    FAMILY HISTORY Family History  Problem Relation Age of Onset  . Cancer Mother     breast and ovarian  . BRCA 1/2 Sister     GYNECOLOGIC HISTORY:  Patient's last menstrual period was 01/21/2015. gx p2, S/P c-SECTION X 2 SOCIAL HISTORY:      ADVANCED DIRECTIVES:    HEALTH MAINTENANCE: Social History  Substance Use Topics  . Smoking status: Never Smoker   . Smokeless tobacco: Never Used  . Alcohol Use: No     Comment: 07/20/2012 "might have a beer once/yr"     Colonoscopy:  PAP:  Bone density:  Lipid panel:  Allergies  Allergen Reactions  . Food Swelling    Red peppers    Current Facility-Administered Medications  Medication Dose Route Frequency Provider Last Rate Last Dose  . 0.9 %  sodium chloride infusion   Intravenous Once Corky Sox, MD      . 0.9 %  sodium chloride infusion   Intravenous Once Norval Gable, MD      . 0.9 %  sodium chloride infusion   Intravenous Once Corky Sox, MD      . 0.9 %  sodium chloride infusion   Intravenous Once Maryellen Pile, MD      . acetaminophen (TYLENOL) suppository 650 mg  650 mg Rectal Q4H PRN Dellia Nims, MD   650 mg at 02/22/15 1858  . acetaminophen (TYLENOL) tablet 650 mg  650 mg Oral Q4H PRN Loleta Chance, MD   650 mg at 02/22/15 0901  . B-complex with vitamin C tablet 1 tablet  1 tablet Oral Daily Roney Jaffe, MD   1  tablet at 03/07/15 1431  . Darbepoetin Alfa (ARANESP) injection 200 mcg  200 mcg Intravenous Q Thu-HD Ernest Haber, PA-C   200 mcg at 03/07/15 1105  . dexamethasone (DECADRON) tablet 40 mg  40 mg Oral Daily Norval Gable, MD   40 mg at 03/07/15 1432  . DULoxetine (CYMBALTA) DR capsule 40 mg  40 mg Oral Daily Ambrose Finland, MD   40 mg at 03/07/15 1432  . feeding supplement (BOOST / RESOURCE BREEZE) liquid 1 Container  1 Container Oral TID BM Dale [place], RD   1 Container at 03/06/15 1032  . feeding supplement (PRO-STAT SUGAR FREE 64) liquid 30 mL  30 mL Oral BID Dale [place], RD  30 mL at 03/05/15 2004  . haloperidol (HALDOL) tablet 0.5 mg  0.5 mg Oral Q8H PRN Norval Gable, MD      . hydroxychloroquine (PLAQUENIL) tablet 400 mg  400 mg Oral Daily Collier Salina, MD   400 mg at 03/07/15 1439  . Immune Globulin 10% (OCTAGAM) IV infusion 30 g  400 mg/kg Intravenous Q24H Chauncey Cruel, MD      . Influenza vac split quadrivalent PF (FLUARIX) injection 0.5 mL  0.5 mL Intramuscular Prior to discharge Axel Filler, MD      . metoprolol succinate (TOPROL-XL) 24 hr tablet 25 mg  25 mg Oral Daily Collier Salina, MD   25 mg at 03/07/15 1439  . midodrine (PROAMATINE) tablet 10 mg  10 mg Oral BID WC Axel Filler, MD   10 mg at 03/08/15 8756  . multivitamin (RENA-VIT) tablet 1 tablet  1 tablet Oral BID Roney Jaffe, MD   1 tablet at 03/08/15 [phone]  . OLANZapine zydis (ZYPREXA) disintegrating tablet 10 mg  10 mg Oral BID PC Ambrose Finland, MD   10 mg at 03/06/15 1715  . ondansetron (ZOFRAN) tablet 4 mg  4 mg Oral Q6H PRN Corky Sox, MD   4 mg at 03/04/15 1208   Or  . ondansetron [facility]) injection 4 mg  4 mg Intravenous Q6H PRN Corky Sox, MD   4 mg at 03/05/15 1025  . oxyCODONE-acetaminophen (PERCOCET/ROXICET) 5-325 MG per tablet 1 tablet  1 tablet Oral Q6H PRN Milagros Loll, MD   1 tablet at 03/06/15 1034  . pantoprazole (PROTONIX) EC  tablet 40 mg  40 mg Oral Daily Gaynell Face Hiatt, RPH   40 mg at 03/07/15 1431  . pneumococcal 23 valent vaccine (PNU-IMMUNE) injection 0.5 mL  0.5 mL Intramuscular Prior to discharge Axel Filler, MD      . polyethylene glycol (MIRALAX / Floria Raveling) packet 17 g  17 g Oral Daily PRN Corky Sox, MD      . romiPLOStim (NPLATE) injection 155 mcg  2 mcg/kg Subcutaneous Once Chauncey Cruel, MD      . sodium chloride (OCEAN) 0.65 % nasal spray 1 spray  1 spray Each Nare PRN Milagros Loll, MD      . sodium chloride 0.9 % injection 3 mL  3 mL Intravenous Q12H Corky Sox, MD   3 mL at 03/07/15 1442  . Warfarin - Pharmacist Dosing Inpatient   Does not apply q1800 Reginia Naas, [facility]   Stopped at 03/02/15 1800    OBJECTIVE: Filed Vitals:   03/08/15 0309 03/08/15 0700  BP: 157/89 150/96  Pulse: 76 80  Temp: 97.3 F (36.3 C) 97.5 F (36.4 C)  Resp: 16 18     Body mass index is 30.7 kg/(m^2).    ECOG FS:3 - Symptomatic, >50% confined to bed  Ocular: Sclerae unicteric, pupils equal, round and reactive to light Ear-nose-throat: Oropharynx clear and moist Lymphatic: No cervical or supraclavicular adenopathy Lungs no rales or rhonchi, good excursion bilaterally Heart regular rate and rhythm, no murmur appreciated Abd soft, nontender, positive bowel sounds MSK no focal spinal tenderness, no joint edema Neuro: non-focal, well-oriented, appropriate affect Breasts:    LAB RESULTS:  CMP     Component Value Date/Time   NA 134* 03/07/2015 0811   K 4.5 03/07/2015 0811   CL 96* 03/07/2015 0811   CO2 25 03/07/2015 0811   GLUCOSE 91 03/07/2015 0811   BUN  27* 03/07/2015 0811   CREATININE 4.82* 03/07/2015 0811   CALCIUM 8.3* 03/07/2015 0811   PROT 5.7* 02/19/2015 1255   ALBUMIN 1.6* 03/07/2015 0811   AST 32 02/19/2015 1255   ALT 18 02/19/2015 1255   ALKPHOS 101 02/19/2015 1255   BILITOT 0.7 02/19/2015 1255   GFRNONAA 10* 03/07/2015 0811   GFRAA 12* 03/07/2015 0811    INo  results found for: SPEP, UPEP  Lab Results  Component Value Date   WBC 14.8* 03/08/2015   NEUTROABS 13.8* 03/07/2015   HGB 7.4* 03/08/2015   HCT 22.5* 03/08/2015   MCV 90.7 03/08/2015   PLT 11* 03/08/2015    @LASTCHEMISTRY @  No results found for: LABCA2  No components found for: LABCA125   Recent Labs Lab 03/08/15 0530  INR 2.28*    Urinalysis    Component Value Date/Time   COLORURINE AMBER* 02/20/2015 1235   APPEARANCEUR TURBID* 02/20/2015 1235   LABSPEC 1.021 02/20/2015 1235   PHURINE 6.0 02/20/2015 1235   GLUCOSEU NEGATIVE 02/20/2015 1235   HGBUR LARGE* 02/20/2015 1235   BILIRUBINUR SMALL* 02/20/2015 1235   KETONESUR 15* 02/20/2015 1235   PROTEINUR >300* 02/20/2015 1235   UROBILINOGEN 0.2 01/25/2015 0513   NITRITE NEGATIVE 02/20/2015 1235   LEUKOCYTESUR LARGE* 02/20/2015 1235    STUDIES: Dg Chest 2 View  02/09/2015  CLINICAL DATA:  Sharp chest pain EXAM: CHEST  2 VIEW COMPARISON:  01/17/2015 FINDINGS: Possible small left pleural effusion. No focal consolidation. No pneumothorax. Mild cardiomegaly. Visualized osseous structures are within normal limits. IMPRESSION: Possible small left pleural effusion. Electronically Signed   By: Julian Hy M.D.   On: 02/09/2015 08:46   Dg Abd 1 View  02/21/2015  CLINICAL DATA:  Abdominal pain and diarrhea EXAM: ABDOMEN - 1 VIEW COMPARISON:  CT abdomen and pelvis January 14, 2015 FINDINGS: There is moderate stool in the colon. There is no bowel dilatation or air-fluid level suggesting obstruction. No free air is seen on this supine examination. There are surgical clips overlying the proximal left femur. IMPRESSION: No demonstrable obstruction or free air.  Moderate stool in colon. Electronically Signed   By: Lowella Grip III M.D.   On: 02/21/2015 19:42   Ct Head Wo Contrast  02/22/2015  CLINICAL DATA:  Diffuse body pain, acute onset.  Initial encounter. EXAM: CT HEAD WITHOUT CONTRAST TECHNIQUE: Contiguous axial  images were obtained from the base of the skull through the vertex without intravenous contrast. COMPARISON:  CT of the head performed 02/19/2015, and MRI of the brain performed 02/20/2015 FINDINGS: There is an evolving subacute to chronic right MCA territory infarct, as previously noted. A tiny focus of adjacent calcification is unchanged in appearance. The posterior fossa, including the cerebellum, brainstem and fourth ventricle, is within normal limits. The third and lateral ventricles, and basal ganglia are unremarkable in appearance. No midline shift is seen. There is no evidence of fracture; visualized osseous structures are unremarkable in appearance. The visualized portions of the orbits are within normal limits. The paranasal sinuses and mastoid air cells are well-aerated. No significant soft tissue abnormalities are seen. IMPRESSION: 1. No acute intracranial abnormality seen on CT. 2. Evolving subacute to chronic right MCA territory infarct again noted, relatively unchanged in appearance. Electronically Signed   By: Garald Balding M.D.   On: 02/22/2015 22:13   Ct Head Wo Contrast  02/19/2015  CLINICAL DATA:  Acute onset of severe confusion. Auditory and visual hallucinations for more than 12 hours. Initial encounter. EXAM: CT HEAD WITHOUT  CONTRAST TECHNIQUE: Contiguous axial images were obtained from the base of the skull through the vertex without intravenous contrast. COMPARISON:  CT of the head, and MRI of the brain performed 01/25/2015 FINDINGS: There is no evidence of acute infarction, mass lesion, or intra- or extra-axial hemorrhage on CT. Prominence of the ventricles suggests mild cortical volume loss. Mild cerebellar atrophy is noted. An evolving relatively chronic right anterior MCA territory infarct is again noted, predominantly at the right frontoparietal region. The brainstem and fourth ventricle are within normal limits. The basal ganglia are unremarkable in appearance. The cerebral  hemispheres demonstrate grossly normal gray-white differentiation. No mass effect or midline shift is seen. There is no evidence of fracture; visualized osseous structures are unremarkable in appearance. The visualized portions of the orbits are within normal limits. A mucus retention cyst or polyp is noted at the right side of the sphenoid sinus. The remaining paranasal sinuses and mastoid air cells are well-aerated. No significant soft tissue abnormalities are seen. IMPRESSION: 1. No acute intracranial pathology seen on CT. 2. 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 3. Mild cortical volume loss noted. 4. Mucus retention cyst or polyp at the right side of the sphenoid sinus. Electronically Signed   By: Garald Balding M.D.   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 EXAM: MRI HEAD WITHOUT CONTRAST TECHNIQUE: Multiplanar, multiecho pulse sequences of the brain and surrounding structures were obtained without intravenous contrast. COMPARISON:  01/25/2015 FINDINGS: Motion degraded despite sedation and repeated attempts. Study is partial. Calvarium and upper cervical spine: No focal marrow signal abnormality. Orbits: No significant findings. Sinuses and Mastoids: Clear. Brain: Recent right MCA territory infarct with expected cortical volume loss of the anterior insula, frontal operculum and lateral right frontal lobe. There is an unexpected amorphous area of continued restricted diffusion in the deep white matter lateral to the frontal horn of the right lateral ventricle. This suggests there is continued evolution of ischemia at the periphery of the occluded vessel territory (see MRA 12/21/2014). Additionally, previously seen restricted diffusion in the right caudate and putamen has resolved without volume loss,  swelling, or signal abnormality -likely sub infarct ischemia at that time. Given patient's history of bacteremia, a septic embolus is considered, but fortunately there is no focal rounded restricted diffusion typical of abscess. Additionally, would expect more swelling in the setting of cerebritis. There is no evidence of hemorrhage or major vessel occlusion. No current cortical diffusion restriction to suggest seizure. Low cerebral volume for age. IMPRESSION: 1. Incomplete motion degraded study due to patient condition. 2. Recent right MCA territory infarct has the same distribution as 01/25/2015. Persistent white matter diffusion restriction suggests continued evolution of ischemia at the periphery of the infarct core. Further discussion above. Electronically Signed   By: Monte Fantasia M.D.   On: 02/20/2015 09:03   US Renal  02/21/2015  CLINICAL DATA:  Flank pain EXAM: RENAL / URINARY TRACT ULTRASOUND COMPLETE COMPARISON:  CT abdomen and pelvis January 14, 2015 FINDINGS: Right Kidney: Length: 9.1 cm. Echogenicity and renal cortical thickness are within normal limits. No perinephric fluid or hydronephrosis visualized. There is a cyst arising from the upper pole of the right kidney measuring 1.1 x 1.3 x 1.2 cm. There is a second nearby cyst in the upper pole right kidney measuring 1.2 x 0.9 x 1.0 cm. No sonographically  demonstrable calculus or ureterectasis. Left Kidney: Length: 9.6 cm. Echogenicity and renal cortical thickness are within normal limits. No mass, perinephric fluid, or hydronephrosis visualized. No sonographically demonstrable calculus or ureterectasis. Bladder: Appears normal for degree of bladder distention. IMPRESSION: Small right renal cysts.  Study otherwise unremarkable. Electronically Signed   By: Lowella Grip III M.D.   On: 02/21/2015 19:22   [place] Extrem Low Left Ltd  02/20/2015  CLINICAL DATA:  39 year old female with history of left femoral loop cortex graft placed 01/04/2015. 6  mm standard Gore graft, with arterial anastomosis on the superficial femoral artery and venous anastomosis on the common femoral vein above the saphenous femoral junction. EXAM: ULTRASOUND LEFT LOWER EXTREMITY LIMITED TECHNIQUE: Ultrasound examination of the lower extremity soft tissues was performed in the area of clinical concern. COMPARISON:  No prior duplex FINDINGS: Grayscale and color duplex ultrasound performed at the proximal left thigh. Arterial venous graft is patent with confirmed flow new. No velocities were sampled Complex fluid collection/soft tissue surrounds the graft, with the majority of fluid on the superficial aspect. Greatest diameter measures 6.6 cm x 3.2 cm x 4.3 cm. Edema within the soft tissues. IMPRESSION: Sonographic survey of left femoral arterial venous loop graft demonstrates complex fluid collection next to patent graft, potentially hematoma, complex seroma, or abscess. Correlation with patient presentation may be useful. Signed, Dulcy Fanny. Earleen Newport, DO [facility] Electronically Signed   By: Corrie Mckusick D.O.   On: 02/20/2015 08:42   Dg Chest Port 1 View  02/19/2015  CLINICAL DATA:  Fever. EXAM: PORTABLE CHEST 1 VIEW COMPARISON:  02/09/2015 FINDINGS: Left retrocardiac opacity is similar to the prior study. This may reflect atelectasis or infiltrate. There may be a small associated effusion. Remainder of the lungs is clear. No right pleural effusion. No pneumothorax. Cardiac silhouette is mildly enlarged. No mediastinal or hilar masses or evidence of adenopathy. Left axillary 2 brachia region stent is stable. Skeletal structures are unremarkable. IMPRESSION: Mild left retrocardiac lung base opacity. This is likely atelectasis possibly with a small effusion. Pneumonia is possible. Electronically Signed   By: Lajean Manes M.D.   On: 02/19/2015 10:16   Dg Knee Left Port  02/24/2015  CLINICAL DATA:  LEFT knee pain and thigh  pain EXAM: PORTABLE LEFT KNEE - 1-2 VIEW COMPARISON:  None. FINDINGS: No fracture of the proximal tibia or distal femur. Patella is normal. No joint effusion. IMPRESSION: No fracture or dislocation.  No joint effusion. Electronically Signed   By: Suzy Bouchard M.D.   On: 02/24/2015 11:35    ASSESSMENT: 39 y.o.   PLAN:  The patient has a good understanding of the overall plan. She agrees with it. She knows the goal of treatment in her case is cure. She will call with any problems that may develop before her next visit here.  Chauncey Cruel, MD   03/08/2015 10:36 AM Medical Oncology and Hematology [facility] [address] [place], [place] [address] Tel. [phone]    Fax. [phone]

## 2015-03-08 NOTE — Progress Notes (Addendum)
During shift change @ 1900, patient's fistula graft on left leg was actively bleeing. Shinita, day shift charge, and Sonia Baller, night shift charge nurse, were holding pressure for almost an hour, site would not stop bleeding, blood saturated entire 1 box of 4x4 gauze, pressure dressing was placed, then bleeding stopped. This RN was aware during handoff report, and been checking site for bleeding every 30 minutes. One unit of platelet was given @ 2124. At the start of platelet transfusion, one site of fistula graft had blood slightly saturated the gauze there. At the end of platelet transfusion @ 2318, this RN noted site was bleeding profusely again. This RN applied pressure at the site. Sonia Baller, charge nurse also came, assisted to hold pressure. Site kept bleeding, Internal Medicine MD and April, rapid response nurse, were called. MD immediately came and assessed patient at bedside. After probably 30 minutes, site stop bleeding, dialysis RN (was sent by April) placed surgical foam and dressing at the site to continue hold pressure. MD placed order to transfer patient to 3S room 15. Will call and give report to receiving RN.

## 2015-03-08 NOTE — Progress Notes (Signed)
Patient Information    Patient Name Sex DOB SSN   [name], [name] Female [birth year] [id number]    Consult Note by Chauncey Cruel, MD at 03/08/2015 10:35 AM    Author: Chauncey Cruel, MD Service: Hematology Author Type: Physician   Filed: 03/08/2015 11:18 AM Note Time: 03/08/2015 10:35 AM Status: Incomplete   Editor: Chauncey Cruel, MD (Physician)     [place]  Telephone:(336) [phone] Fax:(336) [phone]     ID: Bettye Boeck DOB: [birth year] MR#: [id number]  Patient Care Team: Lin Landsman, MD as PCP - General (Family Medicine) PCP: Kristine Garbe, MD GYN: SU:  OTHER MD: Edrick Oh MD, Erling Cruz, Ward Chatters MD, Cristopher Peru MD, Hurley Cisco MD  CHIEF COMPLAINT: severe thrombocytopenia, SLE  CURRENT TREATMENT: Nplate, IVIG   HISTORY OF PRESENT ILLNESS: Ms. Ingle's history of lupus dates back to 11/10/2012 when she was admitted with acute non-oliguric renal failure, rash, anemia, and leukopenia. Nephrology noted microhematuria and proteinuria. 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 She has been followed closely by renal, with subsequent biopsies June and August 2016 and was treated with CellCept and steroids under Dr Charlestine Night but with progressive disease she has been on hemodialysis since September 2016. [She underwent left upper arm hybrid dialysis graft placement 37/85/8850 complicated by steal, then the left thigh (femoral loop AV Gore-Tex) graft 01/04/2015 removed because of infection.]   More recently she has been significantly thrombocytopenic, as her platelet count dropping rapidly from 140,000 02/26/2015 to less than 5000 03/04/2015. A HIT screen 12/09/2014 was  negative. The patient received platelets x3 between 03/04/2015 and 03/08/2015 with no "bump".  We were consulted for further evaluation and treatment   INTERVAL HISTORY: I met with the patient and her mother in the patient's room 03/08/2015  REVIEW OF SYSTEMS: She is having some pain in her left thigh, but not elsewhere. She denies unusual headaches, nausea, vomiting, has a mild cough which is currently nonproductive, denies diarrhea.    PAST MEDICAL HISTORY: Past Medical History  Diagnosis Date  . Hypertension   . Cardiac arrhythmia   . SVT (supraventricular tachycardia) ([place])     "today, last week, 2 wk ago; maybe 1 month ago, etc; started w/in last 3-4 yrs"(07/20/2012)  . Fainting     "~ 1 month ago; probably related to SVT" (07/20/2012)  . Shortness of breath     "related to SVT episodes" (07/20/2012)  . Chest pain at rest     "related to SVT" (07/20/2012)  . Lupus (systemic lupus erythematosus) ([place])   . GERD (gastroesophageal reflux disease)   . Fibromyalgia   . Irritable bowel syndrome (IBS)   . Hypercholesterolemia   . Heart murmur     "small" (12/25/2014)  . TIA (transient ischemic attack) 12/21/2014  . Sleep apnea     "don't wear mask anymore" (12/25/2014)  . Anemia   . History of blood transfusion "several"    "related to low counts"  . Daily headache   . Arthritis     "hips" (12/25/2014)  . Anxiety     "sometimes; don't take anything for it" (12/25/2014)  . ESRD (end stage renal disease) on dialysis [facility]) since 12/07/2014    Diagnosed Aug 2014 w SLE nephritis DPGN  by biopsy, rx cellcept/ steroids. Repeat biopsy early 2016 membranous w/o activity so meds weaned off. Big flare Aug '16 creat 6, 3rd biopsy DPGN, meds resumed. Ended up starting HD Sept 2016  . Stroke ([place])   . History of vascular access device     2016: Sept 9 R IJ cath per IR. Sept 20 not candidate for  fistula due to disease veins, had LUA hybrid graft placed. Sept 21 - steal syndrome, LUA AVG ligated. Oct 7 - new left femoral Gore-tex loop graft per [facility]VS. Oct 29 - removal R IJ tunneled HD cath    PAST SURGICAL HISTORY: Past Surgical History  Procedure Laterality Date  . Cesarean section  2001; 2010  . Cervical biopsy  2013  . Supraventricular tachycardia ablation  07/21/12     Dr. Cristopher Peru   . Peripheral vascular catheterization N/A 12/14/2014    Procedure: Upper Extremity Venography; Surgeon: Elam Dutch, MD; Location: [place] CV LAB; Service: Cardiovascular; Laterality: N/A;  . Insertion hybrid anteriovenous gortex graft Left 12/18/2014    Procedure: INSERTION GORE HYBRID ARTERIOVENOUS GRAFT LEFT AXILLO-BRACHIAL.; Surgeon: Serafina Mitchell, MD; Location: [facility] OR; Service: Vascular; Laterality: Left;  . Ligation of arteriovenous fistula Left 12/19/2014    Procedure: LIGATION OF FISTULA; Surgeon: Elam Dutch, MD; Location: [place]; Service: Vascular; Laterality: Left;  . Appendectomy  2011  . Tubal ligation  2010  . Tee without cardioversion N/A 12/25/2014    Procedure: TRANSESOPHAGEAL ECHOCARDIOGRAM (TEE); Surgeon: Sueanne Margarita, MD; Location: [place]; Service: Cardiovascular; Laterality: N/A;  . Av fistula placement Left 01/04/2015    Procedure: INSERTION OF ARTERIOVENOUS (AV) GORE-TEX GRAFT THIGH; Surgeon: Rosetta Posner, MD; Location: [place]; Service: Vascular; Laterality: Left;  . Tee without cardioversion N/A 01/30/2015    Procedure: TRANSESOPHAGEAL ECHOCARDIOGRAM (TEE); Surgeon: Lelon Perla, MD; Location: [facility] ENDOSCOPY; Service: Cardiovascular; Laterality: N/A;    FAMILY HISTORY Family History  Problem Relation Age of Onset  . Cancer Mother     breast and ovarian  . BRCA 1/2 Sister   The patient's mother Langley Gauss is currently 62. The patient's father, Lennette Bihari,  may be living in [place], but the patient is out of touch with him. He would be in his early 53s. The patient has 2 sisters, living in [place]:  Patient's last menstrual period was 01/21/2015. GX P2. The patient is still having regular periods and has some menorrhagia as well secondary to fibroids.  SOCIAL HISTORY:  Jaleigha currently lives with her mother Langley Gauss and stepfather Barth Kirks [place]), as well as the patient's 2 children, [place], 60, and Danielle 6. In addition Langley Gauss has custody of 2 nieces, one of them severely disabled by schizophrenia, waiting on placement, the other with severe ADD, currently in a group home.  ADVANCED DIRECTIVES:  not in place    HEALTH MAINTENANCE: Social History  Substance Use Topics  . Smoking status: Never Smoker   . Smokeless tobacco: Never Used  . Alcohol Use: No     Comment: 07/20/2012 "might have a beer once/yr"     Allergies  Allergen Reactions  . Food Swelling    Red peppers    Current Facility-Administered Medications  Medication Dose Route Frequency Provider Last Rate Last Dose  . 0.9 % sodium chloride infusion  Intravenous Once Corky Sox, MD    . 0.9 % sodium chloride infusion  Intravenous Once Norval Gable, MD    . 0.9 % sodium chloride infusion  Intravenous Once [place]  Bettey Costa, MD    . 0.9 % sodium chloride infusion  Intravenous Once Maryellen Pile, MD    . acetaminophen (TYLENOL) suppository 650 mg 650 mg Rectal Q4H PRN Dellia Nims, MD  650 mg at 02/22/15 1858  . acetaminophen (TYLENOL) tablet 650 mg 650 mg Oral Q4H PRN Loleta Chance, MD  650 mg at 02/22/15 0901  . B-complex with vitamin C tablet 1 tablet 1 tablet Oral Daily Roney Jaffe, MD  1 tablet at 03/07/15 1431  . Darbepoetin Alfa (ARANESP) injection 200 mcg 200 mcg Intravenous Q Thu-HD Ernest Haber,  PA-C  200 mcg at 03/07/15 1105  . dexamethasone (DECADRON) tablet 40 mg 40 mg Oral Daily Norval Gable, MD  40 mg at 03/07/15 1432  . DULoxetine (CYMBALTA) DR capsule 40 mg 40 mg Oral Daily Ambrose Finland, MD  40 mg at 03/07/15 1432  . feeding supplement (BOOST / RESOURCE BREEZE) liquid 1 Container 1 Container Oral TID BM Dale Emmett, RD  1 Container at 03/06/15 1032  . feeding supplement (PRO-STAT SUGAR FREE 64) liquid 30 mL 30 mL Oral BID Dale [place], RD  30 mL at 03/05/15 2004  . haloperidol (HALDOL) tablet 0.5 mg 0.5 mg Oral Q8H PRN Norval Gable, MD    . hydroxychloroquine (PLAQUENIL) tablet 400 mg 400 mg Oral Daily Collier Salina, MD  400 mg at 03/07/15 1439  . Immune Globulin 10% (OCTAGAM) IV infusion 30 g 400 mg/kg Intravenous Q24H Chauncey Cruel, MD    . Influenza vac split quadrivalent PF (FLUARIX) injection 0.5 mL 0.5 mL Intramuscular Prior to discharge Axel Filler, MD    . metoprolol succinate (TOPROL-XL) 24 hr tablet 25 mg 25 mg Oral Daily Collier Salina, MD  25 mg at 03/07/15 1439  . midodrine (PROAMATINE) tablet 10 mg 10 mg Oral BID WC Axel Filler, MD  10 mg at 03/08/15 4562  . multivitamin (RENA-VIT) tablet 1 tablet 1 tablet Oral BID Roney Jaffe, MD  1 tablet at 03/08/15 [phone]  . OLANZapine zydis (ZYPREXA) disintegrating tablet 10 mg 10 mg Oral BID PC Ambrose Finland, MD  10 mg at 03/06/15 1715  . ondansetron (ZOFRAN) tablet 4 mg 4 mg Oral Q6H PRN Corky Sox, MD  4 mg at 03/04/15 1208   Or  . ondansetron [facility]) injection 4 mg 4 mg Intravenous Q6H PRN Corky Sox, MD  4 mg at 03/05/15 1025  . oxyCODONE-acetaminophen (PERCOCET/ROXICET) 5-325 MG per tablet 1 tablet 1 tablet Oral Q6H PRN Milagros Loll, MD  1 tablet at 03/06/15 1034  . pantoprazole  (PROTONIX) EC tablet 40 mg 40 mg Oral Daily Gaynell Face Hiatt, RPH  40 mg at 03/07/15 1431  . pneumococcal 23 valent vaccine (PNU-IMMUNE) injection 0.5 mL 0.5 mL Intramuscular Prior to discharge Axel Filler, MD    . polyethylene glycol (MIRALAX / Floria Raveling) packet 17 g 17 g Oral Daily PRN Corky Sox, MD    . romiPLOStim (NPLATE) injection 155 mcg 2 mcg/kg Subcutaneous Once Chauncey Cruel, MD    . sodium chloride (OCEAN) 0.65 % nasal spray 1 spray 1 spray Each Nare PRN Milagros Loll, MD    . sodium chloride 0.9 % injection 3 mL 3 mL Intravenous Q12H Corky Sox, MD  3 mL at 03/07/15 1442  . Warfarin - Pharmacist Dosing Inpatient  Does not apply [place], [facility]  Stopped at 03/02/15 1800    OBJECTIVE: Young African-American woman examined  in bed  Filed Vitals:   03/08/15 0309 03/08/15 0700  BP: 157/89 150/96  Pulse: 76 80  Temp: 97.3 F (36.3 C) 97.5 F (36.4 C)  Resp: 16 18   Body mass index is 30.7 kg/(m^2).    sclera anicteric, EOMs intact no cervical adenopathy, no supraclavicular adenopathy Bilateral breath sounds, auscultated anterolaterally Regular rate and rhythm Abdomen soft, nontender Left thigh wound bandaged over  LAB RESULTS:  CMP  Labs (Brief)       Component Value Date/Time   NA 134* 03/07/2015 0811   K 4.5 03/07/2015 0811   CL 96* 03/07/2015 0811   CO2 25 03/07/2015 0811   GLUCOSE 91 03/07/2015 0811   BUN 27* 03/07/2015 0811   CREATININE 4.82* 03/07/2015 0811   CALCIUM 8.3* 03/07/2015 0811   PROT 5.7* 02/19/2015 1255   ALBUMIN 1.6* 03/07/2015 0811   AST 32 02/19/2015 1255   ALT 18 02/19/2015 1255   ALKPHOS 101 02/19/2015 1255   BILITOT 0.7 02/19/2015 1255   GFRNONAA 10* 03/07/2015 0811   GFRAA 12* 03/07/2015 0811      I   Recent Labs    No results found for:  SPEP, UPEP     Recent Labs    Lab Results  Component Value Date   WBC 14.8* 03/08/2015   NEUTROABS 13.8* 03/07/2015   HGB 7.4* 03/08/2015   HCT 22.5* 03/08/2015   MCV 90.7 03/08/2015   PLT 11* 03/08/2015      @LASTCHEMISTRY @   Recent Labs    No results found for: LABCA2     Recent Labs    No components found for: LABCA125     Last Labs      Recent Labs Lab 03/08/15 0530  INR 2.28*      Urinalysis  Labs (Brief)       Component Value Date/Time   COLORURINE AMBER* 02/20/2015 1235   APPEARANCEUR TURBID* 02/20/2015 1235   LABSPEC 1.021 02/20/2015 1235   PHURINE 6.0 02/20/2015 1235   GLUCOSEU NEGATIVE 02/20/2015 1235   HGBUR LARGE* 02/20/2015 1235   BILIRUBINUR SMALL* 02/20/2015 1235   KETONESUR 15* 02/20/2015 1235   PROTEINUR >300* 02/20/2015 1235   UROBILINOGEN 0.2 01/25/2015 0513   NITRITE NEGATIVE 02/20/2015 1235   LEUKOCYTESUR LARGE* 02/20/2015 1235      STUDIES:  Imaging Results    Dg Chest 2 View  02/09/2015 CLINICAL DATA: Sharp chest pain EXAM: CHEST 2 VIEW COMPARISON: 01/17/2015 FINDINGS: Possible small left pleural effusion. No focal consolidation. No pneumothorax. Mild cardiomegaly. Visualized osseous structures are within normal limits. IMPRESSION: Possible small left pleural effusion. Electronically Signed By: Julian Hy M.D. On: 02/09/2015 08:46   Dg Abd 1 View  02/21/2015 CLINICAL DATA: Abdominal pain and diarrhea EXAM: ABDOMEN - 1 VIEW COMPARISON: CT abdomen and pelvis January 14, 2015 FINDINGS: There is moderate stool in the colon. There is no bowel dilatation or air-fluid level suggesting obstruction. No free air is seen on this supine examination. There are surgical clips overlying the proximal left femur. IMPRESSION: No demonstrable obstruction or free air. Moderate stool in colon. Electronically Signed By: Lowella Grip III M.D.  On: 02/21/2015 19:42   Ct Head Wo Contrast  02/22/2015 CLINICAL DATA: Diffuse body pain, acute onset. Initial encounter. EXAM: CT HEAD WITHOUT CONTRAST TECHNIQUE: Contiguous axial images were obtained from the base of the skull through the vertex without intravenous contrast. COMPARISON: CT of the head performed 02/19/2015, and MRI of the brain performed 02/20/2015 FINDINGS: There is an evolving subacute to  chronic right MCA territory infarct, as previously noted. A tiny focus of adjacent calcification is unchanged in appearance. The posterior fossa, including the cerebellum, brainstem and fourth ventricle, is within normal limits. The third and lateral ventricles, and basal ganglia are unremarkable in appearance. No midline shift is seen. There is no evidence of fracture; visualized osseous structures are unremarkable in appearance. The visualized portions of the orbits are within normal limits. The paranasal sinuses and mastoid air cells are well-aerated. No significant soft tissue abnormalities are seen. IMPRESSION: 1. No acute intracranial abnormality seen on CT. 2. Evolving subacute to chronic right MCA territory infarct again noted, relatively unchanged in appearance. Electronically Signed By: Garald Balding M.D. On: 02/22/2015 22:13   Ct Head Wo Contrast  02/19/2015 CLINICAL DATA: Acute onset of severe confusion. Auditory and visual hallucinations for more than 12 hours. Initial encounter. EXAM: CT HEAD WITHOUT CONTRAST TECHNIQUE: Contiguous axial images were obtained from the base of the skull through the vertex without intravenous contrast. COMPARISON: CT of the head, and MRI of the brain performed 01/25/2015 FINDINGS: There is no evidence of acute infarction, mass lesion, or intra- or extra-axial hemorrhage on CT. Prominence of the ventricles suggests mild cortical volume loss. Mild cerebellar atrophy is noted. An evolving relatively chronic right anterior MCA territory infarct is again  noted, predominantly at the right frontoparietal region. The brainstem and fourth ventricle are within normal limits. The basal ganglia are unremarkable in appearance. The cerebral hemispheres demonstrate grossly normal gray-white differentiation. No mass effect or midline shift is seen. There is no evidence of fracture; visualized osseous structures are unremarkable in appearance. The visualized portions of the orbits are within normal limits. A mucus retention cyst or polyp is noted at the right side of the sphenoid sinus. The remaining paranasal sinuses and mastoid air cells are well-aerated. No significant soft tissue abnormalities are seen. IMPRESSION: 1. No acute intracranial pathology seen on CT. 2. 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 3. Mild cortical volume loss noted. 4. Mucus retention cyst or polyp at the right side of the sphenoid sinus. Electronically Signed By: Garald Balding M.D. On: 02/19/2015 06:57   Mr Brain Wo Contrast  02/20/2015 CLINICAL DATA: 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 EXAM: MRI HEAD WITHOUT CONTRAST TECHNIQUE: Multiplanar, multiecho pulse sequences of the brain and surrounding structures were obtained without intravenous contrast. COMPARISON: 01/25/2015 FINDINGS: Motion degraded despite sedation and repeated attempts. Study is partial. Calvarium and upper cervical spine: No focal marrow signal abnormality. Orbits: No significant findings. Sinuses and Mastoids: Clear. Brain: Recent right MCA territory infarct with expected cortical volume loss of the anterior insula, frontal operculum and lateral right frontal lobe. There is an unexpected amorphous area of continued restricted diffusion in the deep white matter lateral to the frontal horn of the right lateral ventricle. This suggests there is continued evolution of ischemia at the  periphery of the occluded vessel territory (see MRA 12/21/2014). Additionally, previously seen restricted diffusion in the right caudate and putamen has resolved without volume loss, swelling, or signal abnormality -likely sub infarct ischemia at that time. Given patient's history of bacteremia, a septic embolus is considered, but fortunately there is no focal rounded restricted diffusion typical of abscess. Additionally, would expect more swelling in the setting of cerebritis. There is no evidence of hemorrhage or major vessel occlusion. No current cortical diffusion restriction to suggest seizure. Low cerebral volume for age. IMPRESSION: 1. Incomplete motion  degraded study due to patient condition. 2. Recent right MCA territory infarct has the same distribution as 01/25/2015. Persistent white matter diffusion restriction suggests continued evolution of ischemia at the periphery of the infarct core. Further discussion above. Electronically Signed By: Monte Fantasia M.D. On: 02/20/2015 09:03   US Renal  02/21/2015 CLINICAL DATA: Flank pain EXAM: RENAL / URINARY TRACT ULTRASOUND COMPLETE COMPARISON: CT abdomen and pelvis January 14, 2015 FINDINGS: Right Kidney: Length: 9.1 cm. Echogenicity and renal cortical thickness are within normal limits. No perinephric fluid or hydronephrosis visualized. There is a cyst arising from the upper pole of the right kidney measuring 1.1 x 1.3 x 1.2 cm. There is a second nearby cyst in the upper pole right kidney measuring 1.2 x 0.9 x 1.0 cm. No sonographically demonstrable calculus or ureterectasis. Left Kidney: Length: 9.6 cm. Echogenicity and renal cortical thickness are within normal limits. No mass, perinephric fluid, or hydronephrosis visualized. No sonographically demonstrable calculus or ureterectasis. Bladder: Appears normal for degree of bladder distention. IMPRESSION: Small right renal cysts. Study otherwise unremarkable. Electronically Signed By: Lowella Grip III M.D. On: 02/21/2015 19:22   [place] Extrem Low Left Ltd  02/20/2015 CLINICAL DATA: 39 year old female with history of left femoral loop cortex graft placed 01/04/2015. 6 mm standard Gore graft, with arterial anastomosis on the superficial femoral artery and venous anastomosis on the common femoral vein above the saphenous femoral junction. EXAM: ULTRASOUND LEFT LOWER EXTREMITY LIMITED TECHNIQUE: Ultrasound examination of the lower extremity soft tissues was performed in the area of clinical concern. COMPARISON: No prior duplex FINDINGS: Grayscale and color duplex ultrasound performed at the proximal left thigh. Arterial venous graft is patent with confirmed flow new. No velocities were sampled Complex fluid collection/soft tissue surrounds the graft, with the majority of fluid on the superficial aspect. Greatest diameter measures 6.6 cm x 3.2 cm x 4.3 cm. Edema within the soft tissues. IMPRESSION: Sonographic survey of left femoral arterial venous loop graft demonstrates complex fluid collection next to patent graft, potentially hematoma, complex seroma, or abscess. Correlation with patient presentation may be useful. Signed, Dulcy Fanny. Earleen Newport, DO [facility] Electronically Signed By: Corrie Mckusick D.O. On: 02/20/2015 08:42   Dg Chest Port 1 View  02/19/2015 CLINICAL DATA: Fever. EXAM: PORTABLE CHEST 1 VIEW COMPARISON: 02/09/2015 FINDINGS: Left retrocardiac opacity is similar to the prior study. This may reflect atelectasis or infiltrate. There may be a small associated effusion. Remainder of the lungs is clear. No right pleural effusion. No pneumothorax. Cardiac silhouette is mildly enlarged. No mediastinal or hilar masses or evidence of adenopathy. Left axillary 2 brachia region stent is stable. Skeletal structures are unremarkable. IMPRESSION: Mild left retrocardiac lung base opacity. This is likely atelectasis possibly with a  small effusion. Pneumonia is possible. Electronically Signed By: Lajean Manes M.D. On: 02/19/2015 10:16   Dg Knee Left Port  02/24/2015 CLINICAL DATA: LEFT knee pain and thigh pain EXAM: PORTABLE LEFT KNEE - 1-2 VIEW COMPARISON: None. FINDINGS: No fracture of the proximal tibia or distal femur. Patella is normal. No joint effusion. IMPRESSION: No fracture or dislocation. No joint effusion. Electronically Signed By: Suzy Bouchard M.D. On: 02/24/2015 11:35     ASSESSMENT: 39 y.o. [place] woman with SLE complicated by nepritis/ ESRD, on HD,  and severe thrombocytopenia  PLAN: I spent approximately 45 minutes with the patient and her mother going over the general pathophysiology of SLE and specifically how platelets and other blood cells can be affected. She understands in lupus  the immune system attacks the body, so the general aim of treatments is to suppress immunity. However this places the patient at risk of infection, and the goal of therapy is to suppress immunity just enough so the patient can have a normal life but not so much that infection becomes even more life threatening.  What I will be doing is more limited, namely trying to bring her platelet count up to a level where she is not at significant risk of bleeding (ideally above 50K). We discussed the fact that transfusions of platelets are not effective in her situation--she is destroying the platelets as soon as given, so there is not "bump" in her counts post transfusion.  Accordingly I have written for Nplate, to increase her native platelet production, and IVIG to steer the immune system away from platelet destruction. She understands these are not in themselves lupus treatments but are more platelet (and blood cell) focussed. We discussed the possible toxicities, side effects and complications of these agents but i expect her to tolerate them well.   More specificallty the Nplate is given weekly and the IVIG will be  given daily x 5. The total fluid load will be low (about 300 cc daily) so I do not expect a volume overload issue (discussed w renal) I would hope to see a bump in her counts in the next few days.  I have written for a heparin-induced Erma cytopenia screen, and a DIC panel. Note that the patient's ferritin is over thousand. Finally I have discussed the situation with the patient's rheumatologist Dr. Debbora Lacrosse [place]. If the current measures do not significantly improve the platelet count we will have to consider other options, including possibly plasma exchange or rituximab  The patient has a good understanding of the overall plan. She agrees with it. She knows the goal of treatment in her case is control. I will also make sure her rheumatologist Dr Charlestine Night is aware of her admission.  Also discussed advanced directives and have consulted SW to help the patient complete and notarize the relevant documents.  Appreciate consulting on this patient. Will follow with you  Chauncey Cruel, MD 03/08/2015 10:36 AM Medical Oncology and Hematology [facility] [address] [place], [place] [address] Tel. [phone] Fax. [phone]

## 2015-03-08 NOTE — Progress Notes (Addendum)
Pt. Transferred to 3s15 from 5W after report received from RN.  Pt is A&Ox4, oriented to unit with call bell within reach.  1 unit of platelets due.  Dressing discussed and HD RN pressure dressing in place on L graft site and must come off in AM.  Will continue to monitor.

## 2015-03-08 NOTE — Progress Notes (Signed)
Subjective: [name] did have an episode of bleeding from her L femoral graft yesterday evening, which eventually stopped with pressure dressings and 2U plt transfusion. Hemodynamically stable throughout. She is generally fatigued and notes a dry cough with chest congestion that started in the last 1-2 days, with clear rhinorrhea and a sore throat. She denies fevers, chills, malaise, myalgias, chest pain, shortness of breath, palpitations, abdominal pain, focal weakness, or increased leg swelling. She does continue to have intermittent nausea and also notes some difficulty initiating swallowing that has been constant for the past 1-2 weeks. She denies foods or liquids getting 'stuck', any swollen nodes, lumps, any reflux symptoms, or pain with swallowing.  Objective: Vital signs in last 24 hours: Filed Vitals:   03/08/15 0145 03/08/15 0210 03/08/15 0309 03/08/15 0700  BP: 160/84 163/86 157/89 150/96  Pulse: 73 68 76 80  Temp: 97.2 F (36.2 C) 97.5 F (36.4 C) 97.3 F (36.3 C) 97.5 F (36.4 C)  TempSrc: Oral Oral Axillary Axillary  Resp: 15 18 16 18   Height:      Weight:      SpO2: 100% 100% 100% 100%   Weight change: -5 lb 1.1 oz (-2.3 kg)  Intake/Output Summary (Last 24 hours) at 03/08/15 1144 Last data filed at 03/08/15 0145  Gross per 24 hour  Intake    806 ml  Output   1777 ml  Net   -971 ml   General Apperance: NAD HEENT: Normocephalic, atraumatic, PERRL, EOMI, anicteric sclera Neck: Supple, trachea midline Lungs: Clear to auscultation bilaterally. No wheezes, rhonchi or rales. Heart: Regular rate and rhythm Abdomen: Soft, nontender, nondistended, no rebound/guarding Extremities: Warm and well perfused, 2+ edema of the left thigh around graft site, 1+ distal LLE edema. Scattered ecchymoses in the R antecubital fossa. Pulses: 1+ throughout Skin: No rashes or lesions Neurologic: Alert and oriented x 3. No gross deficits. Psych: Improved affect, still flat but less so  than previous exams.   Lab Results: Basic Metabolic Panel:  Recent Labs Lab 03/05/15 0942 03/07/15 0811  NA 136 134*  K 4.1 4.5  CL 98* 96*  CO2 25 25  GLUCOSE 68 91  BUN 11 27*  CREATININE 3.05* 4.82*  CALCIUM 8.0* 8.3*  PHOS  --  5.3*   Liver Function Tests:  Recent Labs Lab 03/07/15 0811  ALBUMIN 1.6*   CBC:  Recent Labs Lab 03/06/15 0906 03/07/15 0811 03/08/15 0319  WBC 18.3* 15.4* 14.8*  NEUTROABS 15.6* 13.8*  --   HGB 9.5* 9.0* 7.4*  HCT 28.4* 27.6* 22.5*  MCV 89.0 89.9 90.7  PLT 17* 5* 11*   Coagulation:  Recent Labs Lab 03/05/15 0942 03/06/15 0357 03/07/15 0811 03/08/15 0530  LABPROT 28.2* 27.0* 31.5* 24.9*  INR 2.69* 2.54* 3.11* 2.28*    Micro Results: Recent Results (from the past 240 hour(s))  Culture, blood (routine x 2)     Status: None   Collection Time: 02/27/15  3:41 PM  Result Value Ref Range Status   Specimen Description BLOOD RIGHT HAND  Final   Special Requests IN PEDIATRIC BOTTLE  2CC  Final   Culture NO GROWTH 5 DAYS  Final   Report Status 03/04/2015 FINAL  Final  Culture, blood (routine x 2)     Status: None   Collection Time: 02/27/15  5:10 PM  Result Value Ref Range Status   Specimen Description BLOOD RIGHT HAND  Final   Special Requests IN PEDIATRIC BOTTLE  3CC  Final   Culture  NO GROWTH 5 DAYS  Final   Report Status 03/04/2015 FINAL  Final  MRSA PCR Screening     Status: None   Collection Time: 03/04/15  8:02 PM  Result Value Ref Range Status   MRSA by PCR NEGATIVE NEGATIVE Final    Comment:        The GeneXpert MRSA Assay (FDA approved for NASAL specimens only), is one component of a comprehensive MRSA colonization surveillance program. It is not intended to diagnose MRSA infection nor to guide or monitor treatment for MRSA infections.    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   Acute on chronic anemia and thrombocytopenia: Hgb down to 7.1 on 12/3 from 8.9. Further decreased to 6.1 on 12/5 with Plt count trending down to 7 as well. No signs or symptoms of acute bleeding. Pt is symptomatic from the anemia with shortness of breath and palpitations. Most likely 2/2 lupus flare as pt has history of similar episodes but Unasyn is also a potential precipitant. 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 LDH only mildly elevated with inappropriately low retic, likely not hemolyzing currently and may be more of a hypoproliferative process. C3 is decreased, C4 normal. dsDNA 10. Likely 2/2 lupus flare. Currently not responding well to steroid treatment with moderate bleeding. 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 -Transfused 2U PRBCs and 1U plts on 12/6. 1U plts on 12/7. 2U on 12/8. Transfuse PRN -Discussed case with Dr. Norva Riffle in Rheumatology abt this case - agrees with current course -Consulted hematology; appreciate recs - will tx w IVIG and romiplastim since refractory to steroids -F/u CBCs -Hold coumadin  Acute psychosis, Depression: She had acute confusion and hallucinations secondary UTI. Her symptoms improved but worsened 12/9. 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 Low suspicion for Lupus cerebritis. She remains afebrile. Repeat blood cultures on 11/30 NGTD. These symptoms continue to resolve with olanzapine. -D/c'ed Unasyn on 12/5 2/2 thrombocytopenia - received 10 days of abx (initially to complete 14 day course from 11/25 (end date: 12/9)) -Continue olanzapine 5mg  BID, haldol 0.5mg  PO PRN for agitation -Monitor QTc -Continue duloxetine 30mg  QD. -Consulted psychiatry, appreciate recs  ESRD on HD, s/p Left thigh AVG:AVG placed 01/04/2015. Back on her outpatient TTS schedule. She has some left leg edema  likely 2/2 AVG. Venous duplex with post surgical changes and no DVT. -Midodrine 10mg  BID -Nephrology following, appreciate recs -HD per nephrology -Percocet 5/325mg  q 6 hours prn pain -Decreased coreg for soft pressures -CXR on 12/9 shows fluid congestion - HD per nephrology  Hx recent R. MCA Embolic CVA: PT recommending CIR. -Daily PT/INR -Coumadin per pharm -Continue PT  SLE: Continue Plaquenil Hx SVT/Tachycardia: Continue Toprol-XL 25mg   FEN: Renal VTE ppx: Coumadin  FULL CODE  Dispo: Will likely go to SNF or [facility] upon discharge.  Anticipated discharge in approximately 2-3 day(s).   The patient does have a current PCP Lin Landsman, MD) and does not need an [facility] hospital follow-up appointment after discharge.  The patient does not have transportation limitations that hinder transportation to clinic appointments.   LOS: 17 days   Norval Gable, MD 03/08/2015, 11:44 AM

## 2015-03-08 NOTE — Progress Notes (Signed)
Called and gave report to receiving nurse Rachel Bo, RN in 3S.

## 2015-03-08 NOTE — Progress Notes (Signed)
PRELIMINARY NOTE: 39 y/o with SLE flare, severe thrombocytopenia refractory to platelet transfusion-- we are starting the patient on romiiplostim and IVIG at this time. I have discussed fluid load w renal and they feel pt would be able to tolerate the fluid load (300 cc/day x 5 days) without risk of volume overload. On HD days it is best to give IVIG after HD. Would hope for platelet "bump" in next few days.  Full consult to follow

## 2015-03-08 NOTE — Progress Notes (Signed)
Physical Therapy Treatment Patient Details Name: Nahomi Zasada MRN: [id number] DOB: 10-04-1975 Today's Date: 03/08/2015    History of Present Illness Pt is a 39 y.o. F from CIR who has had several recent hospitalizations.  She had a Rt MCA infarction and developed pseudomonal bacteremia from Rt femoral HD line which was treated.  Was doing well in CIR until she acutely developed severe confusion and hallucinations following HD sessions, likely a result of sepsis from Lt ant thigh abscess. Pt's additional PMH includes lupus, fibromyalgia, anemia.    PT Comments    Slow improvement. Pt fatigues easily.  Used  STEDY to maximize pt's time up in standing.   Follow Up Recommendations  CIR     Equipment Recommendations  Wheelchair (measurements PT);Wheelchair cushion (measurements PT)    Recommendations for Other Services Rehab consult     Precautions / Restrictions Precautions Precautions: Fall Precaution Comments: pt with high anxiety about falling    Mobility  Bed Mobility Overal bed mobility: Needs Assistance;+2 for physical assistance Bed Mobility: Sidelying to Sit;Rolling;Supine to Sit Rolling: Min assist Sidelying to sit: Mod assist;+2 for physical assistance   Sit to supine: Total assist;+2 for physical assistance   General bed mobility comments: cues for sequencing and truncal assist throughout movement.  Transfers Overall transfer level: Needs assistance Equipment used: 2 person hand held assist Transfers: Sit to/from Stand Sit to Stand: Mod assist;+2 physical assistance (into STEDY x 4)         General transfer comment: pt only able to maintain standing upright with pelvic and upper trunk assist for 10 to 20 secs.  Ambulation/Gait             General Gait Details: not able today   Stairs            Wheelchair Mobility    Modified Rankin (Stroke Patients Only) Modified Rankin (Stroke Patients Only) Pre-Morbid Rankin Score: Moderate  disability Modified Rankin: Severe disability     Balance Overall balance assessment: Needs assistance Sitting-balance support: Bilateral upper extremity supported Sitting balance-Leahy Scale: Fair Sitting balance - Comments: can balance without UE's, but prefers their stability assist     Standing balance-Leahy Scale: Poor Standing balance comment: 4 standing trials in the STEDY with significant assist                    Cognition Arousal/Alertness: Awake/alert Behavior During Therapy: Anxious Overall Cognitive Status: Impaired/Different from baseline     Current Attention Level: Sustained;Selective   Following Commands: Follows one step commands with increased time Safety/Judgement: Decreased awareness of deficits;Decreased awareness of safety Awareness: Intellectual Problem Solving: Slow processing;Requires verbal cues;Requires tactile cues      Exercises      General Comments General comments (skin integrity, edema, etc.): SpO2 adequate throughout, but EHR ranged between the 100's and 130's      Pertinent Vitals/Pain Pain Assessment: Faces Faces Pain Scale: Hurts little more Pain Location: legs/knees Pain Descriptors / Indicators: Aching;Grimacing Pain Intervention(s): Monitored during session;Repositioned    Home Living                      Prior Function            PT Goals (current goals can now be found in the care plan section) Acute Rehab PT Goals Patient Stated Goal: Be able to walk PT Goal Formulation: With patient/family Time For Goal Achievement: 03/13/15 Potential to Achieve Goals: Good Progress towards PT goals: Progressing toward goals  Frequency  Min 3X/week    PT Plan Current plan remains appropriate    Co-evaluation             End of Session   Activity Tolerance: Patient limited by fatigue Patient left: in bed;with call bell/phone within reach;with family/visitor present     Time: 1040-1105 PT Time  Calculation (min) (ACUTE ONLY): 25 min  Charges:  $Therapeutic Activity: 23-37 mins                    G Codes:      Florentina Marquart, Tessie Fass 03/08/2015, 11:32 AM  03/08/2015  Donnella Sham, [place] [phone]  (pager)

## 2015-03-08 NOTE — Progress Notes (Signed)
Patient's sister, Elmyra Ricks, was called and informed about patient's current condition and being transferred to 3S.

## 2015-03-08 NOTE — Progress Notes (Signed)
ANTICOAGULATION CONSULT NOTE - Follow Up Consult  Pharmacy Consult for Warfarin Indication: Hx CVA and hypercoagulable state  Allergies  Allergen Reactions  . Food Swelling    Red peppers    Patient Measurements: Height: 5\' 3"  (160 cm) Weight: 173 lb 4.5 oz (78.6 kg) IBW/kg (Calculated) : 52.4  Vital Signs: Temp: 97.3 F (36.3 C) (12/09 0309) Temp Source: Axillary (12/09 0309) BP: 157/89 mmHg (12/09 0309) Pulse Rate: 76 (12/09 0309)  Labs:  Recent Labs  03/05/15 0942  03/06/15 0357 03/06/15 0906 03/07/15 0811 03/08/15 0319 03/08/15 0530  HGB 10.5*  < >  --  9.5* 9.0* 7.4*  --   HCT 32.1*  < >  --  28.4* 27.6* 22.5*  --   PLT 6*  < >  --  17* 5* 11*  --   LABPROT 28.2*  --  27.0*  --  31.5*  --  24.9*  INR 2.69*  --  2.54*  --  3.11*  --  2.28*  CREATININE 3.05*  --   --   --  4.82*  --   --   < > = values in this interval not displayed.  Estimated Creatinine Clearance: 15.6 mL/min (by C-G formula based on Cr of 4.82).   Assessment: 54 YOF with ESRD on Coumadin for history of SLE, stroke, and hypercoagulable state. She had been on Coumadin 5 mg daily from 11/16-11/22/16, with gradual rise to therapeutic INR after 5 days. Subsequently held for potential procedure and restarted on 11/28. Warfarin on hold since 12/2 due to rise in INR, hgb drop and worsening thrombocytopenia. Warfarin x 1 dose was given on 12/6.   This patient was discussed with the IMTS on 12/8 - there is some literature to state holding warfarin until platelets recover to >50 due to increased risk of bleeding and spontaneous hemorrhage. The team was okay with holding the warfarin for now. Pharmacy will monitor trends in platelets and plan to restart warfarin dosing when platelets recover to >50. INR therapeutic at 2.28 today, plt 5>>11. Some oozing of blood from L thigh from 2 sites per MD note last night after receiving 2 units of plts.  Goal of Therapy:  INR 2-3 Monitor platelets by anticoagulation  protocol: Yes   Plan:  1. Hold warfarin for now 2. Will trend platelet recovery and plan to resume when platelets >50 3. Will continue to monitor for signs and symptoms of bleeding  Elicia Lamp, PharmD, [facility] Clinical Pharmacist Pager [phone] 03/08/2015 8:28 AM

## 2015-03-08 NOTE — Progress Notes (Signed)
[facility] Progress Note  Assessment/Plan: 1. AMS / acute psychosis - on zyprexa, Cymbalta / admit team rx PRN Haldol and lorazepam.  2 Acinetobacter UTI Completed unasyn 3 ESRD= TTS HD L thigh graft/Dialysis 12/8. Next tx 12/10 4 Recent cath sepsis (pseudomonas) 5 SLE= plaquenil / ? Flare with trending Low plts and hgb drop / per primary. On Decadron.   6 SP CVA/ endocarditis (marantic)/ hypercoag= on coumadin 7 Debil= In hosp rerhab Noted recom [place] 8 SVT= on MTP, coumadin 9.Anemia =max darbq thurs HD , / hold heparin with hD / admit team work up ? Lupus flare with PLT <5  Transfused 2 units 12/5 with dialysis. Bleeding issues last night-L Thigh AVG would not stop bleeding post HD. Was transferred to SDU. HGB 7.4. Received platelets last night. Last Plt -110 Follow HGB, may need transfusion with HD tomorrow.  10 Vol/HTN= on Midodrine For Hypotension Am bp 114/70/ 5 kg under dry wt Metoprolol 25mg  q day , change to hs avoid decr bp on HD. HD 12/08 NET UF 1777. Required albumin. Consider lowering metoprolol dose.  11. 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 Use 2.0 Ca bath.  12 Malnutrition =low alb 1.4 mvi/ b complex. Add prostat.    Keylen Uzelac H. Amyrie Illingworth NP-C 03/08/2015, 12:27 PM  [facility] [phone]  Subjective: "I've got a cough".  New onset of dry cough. CXR pending.  Patient seems slightly withdrawn, denies SOB, fever, chills. On contact isolation for MRSA. Mother at bedside.  Objective Filed Vitals:   03/08/15 0145 03/08/15 0210 03/08/15 0309 03/08/15 0700  BP: 160/84 163/86 157/89 150/96  Pulse: 73 68 76 80  Temp: 97.2 F (36.2 C) 97.5 F (36.4 C) 97.3 F (36.3 C) 97.5 F (36.4 C)  TempSrc: Oral Oral Axillary Axillary  Resp: 15 18 16 18   Height:      Weight:      SpO2: 100% 100% 100% 100%   Physical Exam General: chronically ill appearing obese female in NAD Heart: S1, S2, II/VI systolic M.  Lungs: bilateral breath sounds CTA Abdomen: soft,  active BS Extremities: No LE edema Dialysis Access: L thigh AVG, bruised, + thrill/+bruit  Dialysis Orders: OP HD= [facility] TTS 4 hr 87.5kg 2/2 bath L thigh AVG Hep 2600 Aranesp 200/week  Additional Objective Labs: Basic Metabolic Panel:  Recent Labs Lab 03/04/15 2000 03/05/15 0942 03/07/15 0811  NA 137 136 134*  K 3.1* 4.1 4.5  CL 98* 98* 96*  CO2 30 25 25   GLUCOSE 66 68 91  BUN 8 11 27*  CREATININE 2.49* 3.05* 4.82*  CALCIUM 7.5* 8.0* 8.3*  PHOS  --   --  5.3*   Liver Function Tests:  Recent Labs Lab 03/07/15 0811  ALBUMIN 1.6*   No results for input(s): LIPASE, AMYLASE in the last 168 hours. CBC:  Recent Labs Lab 03/05/15 0942 03/05/15 1206 03/06/15 0906 03/07/15 0811 03/08/15 [id number]  WBC [id number] DUPLICATE REQUEST  SEE [id number] 18.3* 15.4* 14.8*  NEUTROABS  --  8.2* 15.6* 13.8*  --   HGB [id number]* DUPLICATE REQUEST  SEE [id number] 9.5* 9.0* 7.4*  HCT [id number]* DUPLICATE REQUEST  SEE [id number] 28.4* 27.6* [id number]*  MCV [id number] DUPLICATE REQUEST  SEE [id number] 89.0 89.9 [id number]  PLT 6* DUPLICATE REQUEST  SEE [id number] 17* 5* 11*   Blood Culture    Component Value Date/Time   SDES BLOOD RIGHT HAND 02/27/2015 1710   SPECREQUEST IN PEDIATRIC BOTTLE  3CC 02/27/2015 1710   CULT NO GROWTH 5 DAYS 02/27/2015 1710  REPTSTATUS 03/04/2015 FINAL 02/27/2015 1710    Cardiac Enzymes: No results for input(s): CKTOTAL, CKMB, CKMBINDEX, TROPONINI in the last 168 hours. CBG: No results for input(s): GLUCAP in the last 168 hours. Iron Studies: No results for input(s): IRON, TIBC, TRANSFERRIN, FERRITIN in the last 72 hours. @lablastinr3 @ Studies/Results: Dg Chest Port 1 View  03/08/2015  CLINICAL DATA:  Intermittent shortness of breath.  Lupus. EXAM: PORTABLE CHEST 1 VIEW COMPARISON:  02/19/2015 FINDINGS: Perihilar airspace opacity and layering pleural effusions which are new. Chronic cardiopericardial enlargement. Stable upper mediastinal contours. Left-sided venous stenting. IMPRESSION: Perihilar airspace  disease and layering pleural effusions, new from 02/19/2015 and likely overload/CHF. Electronically Signed   By: Monte Fantasia M.D.   On: 03/08/2015 12:01   Medications:   . sodium chloride   Intravenous Once  . sodium chloride   Intravenous Once  . sodium chloride   Intravenous Once  . sodium chloride   Intravenous Once  . B-complex with vitamin C  1 tablet Oral Daily  . Darbepoetin Alfa  200 mcg Intravenous Q Thu-HD  . dexamethasone  40 mg Oral Daily  . DULoxetine  40 mg Oral Daily  . feeding supplement  1 Container Oral TID BM  . feeding supplement (PRO-STAT SUGAR FREE 64)  30 mL Oral BID  . hydroxychloroquine  400 mg Oral Daily  . IMMUNE GLOBULIN 10% (HUMAN) IV - For Fluid Restriction Only  400 mg/kg Intravenous Q24H  . metoprolol succinate  25 mg Oral Daily  . midodrine  10 mg Oral BID WC  . multivitamin  1 tablet Oral BID  . OLANZapine zydis  10 mg Oral BID PC  . pantoprazole  40 mg Oral Daily  . romiPLOStim  2 mcg/kg Subcutaneous Once  . sodium chloride  3 mL Intravenous Q12H  . Warfarin - Pharmacist Dosing Inpatient   Does not apply [phone]

## 2015-03-09 LAB — PROTIME-INR
INR: 3.12 — AB (ref 0.00–1.49)
PROTHROMBIN TIME: 31.6 s — AB (ref 11.6–15.2)

## 2015-03-09 LAB — RENAL FUNCTION PANEL
ALBUMIN: 1.9 g/dL — AB (ref 3.5–5.0)
Albumin: 2.8 g/dL — ABNORMAL LOW (ref 3.5–5.0)
Anion gap: 10 (ref 5–15)
Anion gap: 10 (ref 5–15)
BUN: 26 mg/dL — AB (ref 6–20)
BUN: 8 mg/dL (ref 6–20)
CALCIUM: 8.5 mg/dL — AB (ref 8.9–10.3)
CO2: 26 mmol/L (ref 22–32)
CO2: 28 mmol/L (ref 22–32)
CREATININE: 3.8 mg/dL — AB (ref 0.44–1.00)
Calcium: 8 mg/dL — ABNORMAL LOW (ref 8.9–10.3)
Chloride: 99 mmol/L — ABNORMAL LOW (ref 101–111)
Chloride: 98 mmol/L — ABNORMAL LOW (ref 101–111)
Creatinine, Ser: 1.96 mg/dL — ABNORMAL HIGH (ref 0.44–1.00)
GFR calc Af Amer: 16 mL/min — ABNORMAL LOW (ref >60)
GFR calc Af Amer: 36 mL/min — ABNORMAL LOW (ref >60)
GFR calc non Af Amer: 14 mL/min — ABNORMAL LOW (ref >60)
GFR calc non Af Amer: 31 mL/min — ABNORMAL LOW (ref >60)
GLUCOSE: 98 mg/dL (ref 65–99)
Glucose, Bld: 80 mg/dL (ref 65–99)
PHOSPHORUS: 3.8 mg/dL (ref 2.5–4.6)
POTASSIUM: 4.6 mmol/L (ref 3.5–5.1)
Phosphorus: 1.8 mg/dL — ABNORMAL LOW (ref 2.5–4.6)
Potassium: 3.2 mmol/L — ABNORMAL LOW (ref 3.5–5.1)
SODIUM: 135 mmol/L (ref 135–145)
Sodium: 136 mmol/L (ref 135–145)

## 2015-03-09 LAB — PREPARE PLATELET PHERESIS
UNIT DIVISION: 0
UNIT DIVISION: 0
Unit division: 0

## 2015-03-09 LAB — CBC
HCT: 24.6 % — ABNORMAL LOW (ref 36.0–46.0)
HCT: 25.7 % — ABNORMAL LOW (ref 36.0–46.0)
Hemoglobin: 7.8 g/dL — ABNORMAL LOW (ref 12.0–15.0)
Hemoglobin: 8.1 g/dL — ABNORMAL LOW (ref 12.0–15.0)
MCH: 29.4 pg (ref 26.0–34.0)
MCH: 29.5 pg (ref 26.0–34.0)
MCHC: 31.7 g/dL (ref 30.0–36.0)
MCHC: 31.5 g/dL (ref 30.0–36.0)
MCV: 92.8 fL (ref 78.0–100.0)
MCV: 93.5 fL (ref 78.0–100.0)
PLATELETS: 23 10*3/uL — AB (ref 150–400)
Platelets: 21 10*3/uL — CL (ref 150–400)
RBC: 2.65 MIL/uL — ABNORMAL LOW (ref 3.87–5.11)
RBC: 2.75 MIL/uL — ABNORMAL LOW (ref 3.87–5.11)
RDW: 16.7 % — AB (ref 11.5–15.5)
RDW: 16.6 % — ABNORMAL HIGH (ref 11.5–15.5)
WBC: 13.7 10*3/uL — AB (ref 4.0–10.5)
WBC: 9.9 10*3/uL (ref 4.0–10.5)

## 2015-03-09 LAB — HEPARIN INDUCED PLATELET AB (HIT ANTIBODY): HEPARIN INDUCED PLT AB: 0.488 {OD_unit} — AB (ref 0.000–0.400)

## 2015-03-09 LAB — PROCALCITONIN: Procalcitonin: 6.14 ng/mL

## 2015-03-09 LAB — MRSA PCR SCREENING: MRSA by PCR: NEGATIVE

## 2015-03-09 MED ORDER — SODIUM CHLORIDE 0.9 % IV SOLN: 100.0000 mL | INTRAVENOUS | Status: DC | PRN

## 2015-03-09 MED ORDER — PHYTONADIONE 5 MG PO TABS
2.5000 mg | ORAL_TABLET | Freq: Once | ORAL | Status: DC
  Filled 2015-03-09: qty 1

## 2015-03-09 MED ORDER — VANCOMYCIN HCL 10 G IV SOLR
1750.0000 mg | Freq: Once | INTRAVENOUS | Status: AC
  Administered 2015-03-09: 1750 mg via INTRAVENOUS
  Filled 2015-03-09 (×2): qty 1750

## 2015-03-09 MED ORDER — DEXTROSE 5 % IV SOLN
2.0000 g | Freq: Once | INTRAVENOUS | Status: DC
  Filled 2015-03-09: qty 2

## 2015-03-09 MED ORDER — ALTEPLASE 2 MG IJ SOLR: 2.0000 mg | Freq: Once | INTRAMUSCULAR | Status: DC | PRN

## 2015-03-09 MED ORDER — ALBUMIN HUMAN 25 % IV SOLN
INTRAVENOUS | Status: AC
  Administered 2015-03-09: 25 g
  Filled 2015-03-09: qty 100

## 2015-03-09 MED ORDER — HEPARIN SODIUM (PORCINE) 1000 UNIT/ML DIALYSIS: 1000.0000 [IU] | INTRAMUSCULAR | Status: DC | PRN

## 2015-03-09 MED ORDER — LIDOCAINE-PRILOCAINE 2.5-2.5 % EX CREA: 1.0000 "application " | TOPICAL_CREAM | CUTANEOUS | Status: DC | PRN

## 2015-03-09 MED ORDER — NYSTATIN 100000 UNIT/ML MT SUSP
5.0000 mL | Freq: Four times a day (QID) | OROMUCOSAL | Status: AC
  Administered 2015-03-09 – 2015-03-15 (×21): 500000 [IU] via ORAL
  Filled 2015-03-09 (×20): qty 5

## 2015-03-09 MED ORDER — HYDROCODONE-HOMATROPINE 5-1.5 MG/5ML PO SYRP
5.0000 mL | ORAL_SOLUTION | Freq: Four times a day (QID) | ORAL | Status: DC | PRN
  Administered 2015-03-09 – 2015-03-12 (×5): 5 mL via ORAL
  Filled 2015-03-09 (×5): qty 5

## 2015-03-09 MED ORDER — SODIUM CHLORIDE 0.9 % IV SOLN
1750.0000 mg | Freq: Once | INTRAVENOUS | Status: DC
Start: 2015-03-09 — End: 2015-03-09
  Filled 2015-03-09: qty 1750

## 2015-03-09 MED ORDER — LIDOCAINE HCL (PF) 1 % IJ SOLN: 5.0000 mL | INTRAMUSCULAR | Status: DC | PRN

## 2015-03-09 MED ORDER — PENTAFLUOROPROP-TETRAFLUOROETH EX AERO: 1.0000 "application " | INHALATION_SPRAY | CUTANEOUS | Status: DC | PRN

## 2015-03-09 MED ORDER — DEXTROSE 5 % IV SOLN
2.0000 g | Freq: Once | INTRAVENOUS | Status: AC
  Administered 2015-03-09: 2 g via INTRAVENOUS
  Filled 2015-03-09: qty 2

## 2015-03-09 NOTE — Progress Notes (Signed)
Subjective: [name] did have an episode of bleeding from her L femoral graft yesterday evening, which eventually stopped with pressure dressings and 2U plt transfusion. Hemodynamically stable throughout. She still has a dry cough with chest congestion x2 days, with clear rhinorrhea. Sore throat is improving. She denies fevers, chills, malaise, myalgias, chest pain, shortness of breath, palpitations, abdominal pain, focal weakness, or increased leg swelling. She continues to have intermittent swallowing difficulties without pain, 'stuck' feeling, dry mouth or reflux symptoms.  Objective: Vital signs in last 24 hours: Filed Vitals:   03/08/15 1732 03/08/15 2046 03/09/15 0616 03/09/15 0616  BP: 144/104 156/87  151/86  Pulse: 79 84  88  Temp:  97.6 F (36.4 C)  97.6 F (36.4 C)  TempSrc:  Oral  Oral  Resp: 23 18  20   Height:      Weight:   174 lb 13.2 oz (79.3 kg)   SpO2: 94% 100%  97%   Weight change: -2 lb 3.3 oz (-1 kg)  Intake/Output Summary (Last 24 hours) at 03/09/15 [id number] Last data filed at 03/09/15 0600  Gross per 24 hour  Intake    240 ml  Output      0 ml  Net    240 ml   General Apperance: NAD HEENT: Normocephalic, atraumatic, PERRL, EOMI, anicteric sclera Neck: Supple, trachea midline Lungs: Normal effort, rhonchi bilaterally. No wheezes or other breath sounds auscultated. Heart: Regular rate and rhythm Abdomen: Soft, nontender, nondistended, no rebound/guarding Extremities: Warm and well perfused, 2+ edema of the left thigh around graft site, 1+ distal LLE edema. Scattered ecchymoses in the R antecubital fossa. Pulses: 1+ throughout Skin: No rashes or lesions Neurologic: Alert and oriented x 3. No gross deficits. Psych: Improved affect, still flat but less so than previous exams.   Lab Results: Basic Metabolic Panel:  Recent Labs Lab 03/07/15 0811 03/09/15 0428  NA 134* 135  K 4.5 4.6  CL 96* 99*  CO2 25 26  GLUCOSE 91 98  BUN 27* 26*  CREATININE  4.82* 3.80*  CALCIUM 8.3* 8.5*  PHOS 5.3* 3.8   Liver Function Tests:  Recent Labs Lab 03/07/15 0811 03/09/15 0428  ALBUMIN 1.6* 1.9*   CBC:  Recent Labs Lab 03/06/15 0906 03/07/15 0811 03/08/15 0319 03/08/15 1536 03/09/15 0428  WBC 18.3* 15.4* 14.8*  --  13.7*  NEUTROABS 15.6* 13.8*  --   --   --   HGB 9.5* 9.0* 7.4*  --  7.8*  HCT 28.4* 27.6* 22.5*  --  24.6*  MCV 89.0 89.9 90.7  --  92.8  PLT 17* 5* 11* 7* 23*   Coagulation:  Recent Labs Lab 03/07/15 0811 03/08/15 0530 03/08/15 1536 03/09/15 0428  LABPROT 31.5* 24.9* 27.8* 31.6*  INR 3.11* 2.28* 2.64* 3.12*    Micro Results: Recent Results (from the past 240 hour(s))  Culture, blood (routine x 2)     Status: None   Collection Time: 02/27/15  3:41 PM  Result Value Ref Range Status   Specimen Description BLOOD RIGHT HAND  Final   Special Requests IN PEDIATRIC BOTTLE  2CC  Final   Culture NO GROWTH 5 DAYS  Final   Report Status 03/04/2015 FINAL  Final  Culture, blood (routine x 2)     Status: None   Collection Time: 02/27/15  5:10 PM  Result Value Ref Range Status   Specimen Description BLOOD RIGHT HAND  Final   Special Requests IN PEDIATRIC BOTTLE  3CC  Final  Culture NO GROWTH 5 DAYS  Final   Report Status 03/04/2015 FINAL  Final  MRSA PCR Screening     Status: None   Collection Time: 03/04/15  8:02 PM  Result Value Ref Range Status   MRSA by PCR NEGATIVE NEGATIVE Final    Comment:        The GeneXpert MRSA Assay (FDA approved for NASAL specimens only), is one component of a comprehensive MRSA colonization surveillance program. It is not intended to diagnose MRSA infection nor to guide or monitor treatment for MRSA infections.    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   Acute on chronic anemia and  thrombocytopenia: Most likely 2/2 lupus flare as pt has history of similar episodes but Unasyn is also a potential precipitant. Platelet nadir of <5. 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 LDH only mildly elevated with inappropriately low retic, likely not hemolyzing currently and may be more of a hypoproliferative process. C3 is decreased, C4 normal. dsDNA 10. Likely 2/2 lupus flare. Currently not responding well to steroid treatment with moderate bleeding. -Day 2/5 of IVIG with romiplastim support for steroid-refractory SLE-induced thrombocytopenia, per hematology recs -s/p dexamethasone 40mg  PO x 4 days (ending 12/9) -Transfused 2U PRBCs and 1U plts on 12/6. 1U plts on 12/7. 2U on 12/8. Transfuse PRN -Consulted case with Dr. Norva Riffle in Rheumatology and Dr. Jana Hakim in Hematology - concordance with current plan -F/u CBCs -Hold coumadin  Dry cough w/ rhinorrhea- nonproductive cough with clear rhinorrhea and sore throat. CXR showed no consolidation with likely fluid overload pattern possible explaining the cough. Afebrile, rhonchi on exam. S/s most likely 2/2 URI, however will require close monitoring given her SLE and immunocompromised status. -Continue HD -Hycodin PRN  Swallowing difficulties - difficult to exam oropharynx currently but history suggestive of candidiasis vs functional swallowing difficulties. No other reflux symptoms or esophagitis symptoms, issue is difficulty initiating swallow x 2 weeks. -Treat empirically with nystatin suspension  Acute psychosis, Depression: She had acute confusion and hallucinations secondary UTI. Her symptoms improved but worsened 12/9. 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 Low suspicion for Lupus cerebritis. She remains afebrile. Repeat blood cultures on 11/30 NGTD. These symptoms continue to resolve with olanzapine. -D/c'ed Unasyn on 12/5 2/2 thrombocytopenia - received 10 days of abx (initially to complete 14 day course from  11/25 (end date: 12/9)) -Continue olanzapine 5mg  BID -Monitor QTc -Continue duloxetine 30mg  QD. -Consulted psychiatry, appreciate recs  ESRD on HD, s/p Left thigh AVG:AVG placed 01/04/2015. Back on her outpatient TTS schedule. She has some left leg edema likely 2/2 AVG. Venous duplex with post surgical changes and no DVT. -Midodrine 10mg  BID -Nephrology following, appreciate recs -HD per nephrology -Percocet 5/325mg  q 6 hours prn pain -Decreased coreg for soft pressures -CXR on 12/9 shows fluid congestion - HD per nephrology  Hx recent R. MCA Embolic CVA -Daily PT/INR -Coumadin per pharm -Continue PT. CIR recs SNF but pt declines currently and wants home health  SLE: Continue Plaquenil  Hx SVT/Tachycardia: Decreased toprol XL to 12.5mg  2/2 pressures  FEN: Renal VTE ppx: Coumadin  FULL CODE  Dispo: Will likely go to SNF or [facility] upon discharge.  Anticipated discharge in approximately 4-5 day(s).   The patient does have a current PCP Lin Landsman, MD) and does not need an [facility] hospital follow-up appointment after discharge.  The patient does not have transportation limitations that hinder transportation to clinic appointments.   LOS: 18 days   Norval Gable, MD 03/09/2015, 8:33 AM

## 2015-03-09 NOTE — Progress Notes (Signed)
[facility] Progress Note   Subjective: coughing, hard to sleep from coughing  Filed Vitals:   03/08/15 1732 03/08/15 2046 03/09/15 0616 03/09/15 0616  BP: 144/104 156/87  151/86  Pulse: 79 84  88  Temp:  97.6 F (36.4 C)  97.6 F (36.4 C)  TempSrc:  Oral  Oral  Resp: 23 18  20   Height:      Weight:   79.3 kg (174 lb 13.2 oz)   SpO2: 94% 100%  97%    Inpatient medications: . sodium chloride   Intravenous Once  . sodium chloride   Intravenous Once  . sodium chloride   Intravenous Once  . sodium chloride   Intravenous Once  . B-complex with vitamin C  1 tablet Oral Daily  . Darbepoetin Alfa  200 mcg Intravenous Q Thu-HD  . DULoxetine  40 mg Oral Daily  . feeding supplement (NEPRO CARB STEADY)  237 mL Oral BID BM  . feeding supplement (PRO-STAT SUGAR FREE 64)  30 mL Oral BID  . hydroxychloroquine  400 mg Oral Daily  . IMMUNE GLOBULIN 10% (HUMAN) IV - For Fluid Restriction Only  400 mg/kg Intravenous Q24H  . metoprolol succinate  12.5 mg Oral q1800  . midodrine  10 mg Oral BID WC  . multivitamin  1 tablet Oral BID  . nystatin  5 mL Oral QID  . OLANZapine zydis  10 mg Oral BID PC  . pantoprazole  40 mg Oral Daily  . sodium chloride  3 mL Intravenous Q12H  . Warfarin - Pharmacist Dosing Inpatient   Does not apply q1800     acetaminophen, acetaminophen, haloperidol, HYDROcodone-homatropine, Influenza vac split quadrivalent PF, ondansetron **OR** ondansetron (ZOFRAN) IV, oxyCODONE-acetaminophen, pneumococcal 23 valent vaccine, polyethylene glycol, sodium chloride  Exam: Chron ill appearing young AAF No jvd Chest clear bilat, occ rhonchi RRR no mrg Abd obese soft ntnd  Ext ^bruising UE's , trace hip edema L thigh 2+ edema L thigh AVG +bruit Neuro bilat LE weak 2/5, nonfocal, Ox 3    [facility] TTS 4 hr 87.5kg 2/2 bath L thigh AVG Hep 2600 Aranesp 200/week  Assessment: 1 Thrombocytopenia severe, for IVIG/ thromboplastin per Hematology , failed decadron  trial 2 Cough/ pulm infiltrates - by xray 12/9, bilat and severe. Doubt this is from edema. 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    Kelly Splinter MD [facility] pager [phone]    cell [phone] 03/09/2015, 9:27 AM    Recent Labs Lab 03/05/15 0942 03/07/15 0811 03/09/15 0428  NA 136 134* 135  K 4.1 4.5 4.6  CL 98* 96* 99*  CO2 25 25 26   GLUCOSE 68 91 98  BUN 11 27* 26*  CREATININE 3.05* 4.82* 3.80*  CALCIUM 8.0* 8.3* 8.5*  PHOS  --  5.3* 3.8    Recent Labs Lab 03/07/15 0811 03/09/15 0428  ALBUMIN 1.6* 1.9*    Recent Labs Lab 03/05/15 1206 03/06/15 0906 03/07/15 0811 03/08/15 0319 03/08/15 1536 [id number]  WBC DUPLICATE REQUEST  SEE [id number] 18.3* 15.4*  14.8*  --  13.7*  NEUTROABS 8.2* 15.6* 13.8*  --   --   --   HGB DUPLICATE REQUEST  SEE [id number] 9.5* 9.0* 7.4*  --  7.8*  HCT DUPLICATE REQUEST  SEE [id number] 28.4* 27.6* 22.5*  --  [id number]*  MCV DUPLICATE REQUEST  SEE [id number] 89.0 89.9 90.7  --  [id number]  PLT DUPLICATE REQUEST  SEE [id number] 17* 5* 11* 7* 23*

## 2015-03-09 NOTE — Progress Notes (Signed)
ANTICOAGULATION CONSULT NOTE - Follow Up Consult  Pharmacy Consult for Warfarin Indication: Hx CVA and hypercoagulable state  Allergies  Allergen Reactions  . Food Swelling    Red peppers    Patient Measurements: Height: 5\' 3"  (160 cm) Weight: 174 lb 13.2 oz (79.3 kg) IBW/kg (Calculated) : 52.4  Vital Signs: Temp: 97.6 F (36.4 C) (12/10 0616) Temp Source: Oral (12/10 0616) BP: 151/86 mmHg (12/10 0616) Pulse Rate: 88 (12/10 0616)  Labs:  Recent Labs  03/07/15 0811 03/08/15 0319 03/08/15 0530 03/08/15 1536 03/09/15 0428  HGB 9.0* 7.4*  --   --  7.8*  HCT 27.6* 22.5*  --   --  24.6*  PLT 5* 11*  --  7* 23*  APTT  --   --   --  41*  --   LABPROT 31.5*  --  24.9* 27.8* 31.6*  INR 3.11*  --  2.28* 2.64* 3.12*  CREATININE 4.82*  --   --   --  3.80*    Estimated Creatinine Clearance: 19.8 mL/min (by C-G formula based on Cr of 3.8).   Assessment: 8 YOF with ESRD on Coumadin for history of SLE, stroke, and hypercoagulable state. Warfarin on hold since 12/2 due to rise in INR, hgb drop and worsening thrombocytopenia. Warfarin x 1 dose was given on 12/6.    *This patient was discussed with the IMTS on 12/8 - there is some literature to state holding warfarin until platelets recover to >50 due to increased risk of bleeding and spontaneous hemorrhage. The team was okay with holding the warfarin for now*  Pharmacy will plan to restart warfarin when platelets recover to >50. INR remains therapeutic at 3.12 today, plt 5>>11>>23. Hgb 7.8.   Goal of Therapy:  INR 2-3 Monitor platelets by anticoagulation protocol: Yes   Plan:  1. Continue to hold warfarin 2. Will trend platelet recovery and plan to resume when platelets >50 3. Will continue to monitor for signs and symptoms of bleeding  Angela Burke, PharmD Pharmacy Resident Pager: [phone]  03/09/2015 1:46 PM

## 2015-03-09 NOTE — Progress Notes (Addendum)
ANTIBIOTIC CONSULT NOTE - INITIAL  Pharmacy Consult for Vanc/Ceftaz Indication: pneumonia  Allergies  Allergen Reactions  . Food Swelling    Red peppers    Patient Measurements: Height: 5\' 3"  (160 cm) Weight: 174 lb 13.2 oz (79.3 kg) IBW/kg (Calculated) : 52.4  Vital Signs: Temp: 97.9 F (36.6 C) (12/10 1346) Temp Source: Oral (12/10 1346) BP: 144/90 mmHg (12/10 1346) Pulse Rate: 92 (12/10 1346) Intake/Output from previous day: 12/09 0701 - 12/10 0700 In: 240 [P.O.:240] Out: 0  Intake/Output from this shift: Total I/O In: 40 [P.O.:40] Out: -   Labs:  Recent Labs  03/07/15 0811 03/08/15 0319 03/08/15 1536 03/09/15 0428  WBC 15.4* 14.8*  --  13.7*  HGB 9.0* 7.4*  --  7.8*  PLT 5* 11* 7* 23*  CREATININE 4.82*  --   --  3.80*   Estimated Creatinine Clearance: 19.8 mL/min (by C-G formula based on Cr of 3.8). No results for input(s): VANCOTROUGH, VANCOPEAK, VANCORANDOM, GENTTROUGH, GENTPEAK, GENTRANDOM, TOBRATROUGH, TOBRAPEAK, TOBRARND, AMIKACINPEAK, AMIKACINTROU, AMIKACIN in the last 72 hours.   Microbiology: Recent Results (from the past 720 hour(s))  C difficile quick scan w PCR reflex     Status: None   Collection Time: 02/12/15  4:35 PM  Result Value Ref Range Status   C Diff antigen NEGATIVE NEGATIVE Final   C Diff toxin NEGATIVE NEGATIVE Final   C Diff interpretation Negative for toxigenic C. difficile  Final  Culture, blood (routine x 2)     Status: None   Collection Time: 02/19/15  9:50 AM  Result Value Ref Range Status   Specimen Description BLOOD RIGHT ANTECUBITAL  Final   Special Requests BOTTLES DRAWN AEROBIC AND ANAEROBIC 5CCS  Final   Culture NO GROWTH 5 DAYS  Final   Report Status 02/24/2015 FINAL  Final  Culture, blood (routine x 2)     Status: None   Collection Time: 02/19/15 10:00 AM  Result Value Ref Range Status   Specimen Description BLOOD RIGHT HAND  Final   Special Requests BOTTLES DRAWN AEROBIC AND ANAEROBIC 5CCS  Final   Culture  NO GROWTH 5 DAYS  Final   Report Status 02/24/2015 FINAL  Final  Urine culture     Status: None   Collection Time: 02/20/15 12:35 PM  Result Value Ref Range Status   Specimen Description URINE, CATHETERIZED  Final   Special Requests NONE  Final   Culture   Final    >=100,000 COLONIES/mL VANCOMYCIN RESISTANT ENTEROCOCCUS >=100,000 COLONIES/mL ACINETOBACTER CALCOACETICUS/BAUMANNII COMPLEX    Report Status 02/24/2015 FINAL  Final   Organism ID, Bacteria ACINETOBACTER CALCOACETICUS/BAUMANNII COMPLEX  Final   Organism ID, Bacteria VANCOMYCIN RESISTANT ENTEROCOCCUS  Final      Susceptibility   Acinetobacter calcoaceticus/baumannii complex - MIC*    CEFTAZIDIME >=64 RESISTANT Resistant     CEFTRIAXONE >=64 RESISTANT Resistant     CIPROFLOXACIN >=4 RESISTANT Resistant     GENTAMICIN 2 SENSITIVE Sensitive     IMIPENEM 2 SENSITIVE Sensitive     PIP/TAZO 32 INTERMEDIATE Intermediate     TRIMETH/SULFA >=320 RESISTANT Resistant     AMPICILLIN/SULBACTAM 4 SENSITIVE Sensitive     * >=100,000 COLONIES/mL ACINETOBACTER CALCOACETICUS/BAUMANNII COMPLEX   Vancomycin resistant enterococcus - MIC*    AMPICILLIN <=2 SENSITIVE Sensitive     LEVOFLOXACIN >=8 RESISTANT Resistant     NITROFURANTOIN <=16 SENSITIVE Sensitive     VANCOMYCIN >=32 RESISTANT Resistant     LINEZOLID 2 SENSITIVE Sensitive     * >=100,000 COLONIES/mL VANCOMYCIN  RESISTANT ENTEROCOCCUS  Culture, blood (routine x 2)     Status: None   Collection Time: 02/20/15  3:30 PM  Result Value Ref Range Status   Specimen Description BLOOD VENOUS  Final   Special Requests BOTTLES DRAWN AEROBIC AND ANAEROBIC 10ML  Final   Culture NO GROWTH 5 DAYS  Final   Report Status 02/25/2015 FINAL  Final  Culture, blood (routine x 2)     Status: None   Collection Time: 02/20/15  4:16 PM  Result Value Ref Range Status   Specimen Description BLOOD ARTERIAL LINE  Final   Special Requests BOTTLES DRAWN AEROBIC AND ANAEROBIC 10ML  Final   Culture NO GROWTH  5 DAYS  Final   Report Status 02/25/2015 FINAL  Final  Culture, blood (routine x 2)     Status: None   Collection Time: 02/21/15  8:36 PM  Result Value Ref Range Status   Specimen Description BLOOD RIGHT HAND  Final   Special Requests IN PEDIATRIC BOTTLE 3CC  Final   Culture NO GROWTH 5 DAYS  Final   Report Status 02/26/2015 FINAL  Final  Culture, blood (routine x 2)     Status: None   Collection Time: 02/21/15  9:21 PM  Result Value Ref Range Status   Specimen Description BLOOD RIGHT THUMB  Final   Special Requests IN PEDIATRIC BOTTLE 1.5CC  Final   Culture NO GROWTH 5 DAYS  Final   Report Status 02/26/2015 FINAL  Final  Culture, blood (routine x 2)     Status: None   Collection Time: 02/27/15  3:41 PM  Result Value Ref Range Status   Specimen Description BLOOD RIGHT HAND  Final   Special Requests IN PEDIATRIC BOTTLE  2CC  Final   Culture NO GROWTH 5 DAYS  Final   Report Status 03/04/2015 FINAL  Final  Culture, blood (routine x 2)     Status: None   Collection Time: 02/27/15  5:10 PM  Result Value Ref Range Status   Specimen Description BLOOD RIGHT HAND  Final   Special Requests IN PEDIATRIC BOTTLE  3CC  Final   Culture NO GROWTH 5 DAYS  Final   Report Status 03/04/2015 FINAL  Final  MRSA PCR Screening     Status: None   Collection Time: 03/04/15  8:02 PM  Result Value Ref Range Status   MRSA by PCR NEGATIVE NEGATIVE Final    Comment:        The GeneXpert MRSA Assay (FDA approved for NASAL specimens only), is one component of a comprehensive MRSA colonization surveillance program. It is not intended to diagnose MRSA infection nor to guide or monitor treatment for MRSA infections.     Medical History: Past Medical History  Diagnosis Date  . Hypertension   . Cardiac arrhythmia   . SVT (supraventricular tachycardia) ([place])     "today, last week, 2 wk ago; maybe 1 month ago, etc; started w/in last 3-4 yrs"(07/20/2012)  . Fainting     "~ 1 month ago; probably related  to SVT" (07/20/2012)  . Shortness of breath     "related to SVT episodes" (07/20/2012)  . Chest pain at rest     "related to SVT" (07/20/2012)  . Lupus (systemic lupus erythematosus) ([place])   . GERD (gastroesophageal reflux disease)   . Fibromyalgia   . Irritable bowel syndrome (IBS)   . Hypercholesterolemia   . Heart murmur     "small" (12/25/2014)  . TIA (transient ischemic attack) 12/21/2014  .  Sleep apnea     "don't wear mask anymore" (12/25/2014)  . Anemia   . History of blood transfusion "several"    "related to low counts"  . Daily headache   . Arthritis     "hips" (12/25/2014)  . Anxiety     "sometimes; don't take anything for it" (12/25/2014)  . ESRD (end stage renal disease) on dialysis ([place]) since 12/07/2014    Diagnosed Aug 2014 w SLE nephritis DPGN by biopsy, rx cellcept/ steroids.  Repeat biopsy early 2016 membranous w/o activity so meds weaned off. Big flare Aug '16 creat 6, 3rd biopsy DPGN, meds resumed. Ended up starting HD Sept 2016  . Stroke ([place])   . History of vascular access device     2016: Sept 9  R IJ cath per IR.  Sept 20 not candidate for fistula due to disease veins, had LUA hybrid graft placed. Sept 21 - steal syndrome, LUA AVG ligated. Oct 7 - new left femoral Gore-tex loop graft per VVS. Oct 29 - removal R IJ tunneled HD cath    Assessment:  39 y/o F w/ ESRD on HD initially presented with confusion and UTI. Pharmacy consulted to start vanc/ceftaz. Patient is currently in HD.  Goal of Therapy:  Pre-HD Vanc level 15-25  Plan:  Vancomycin IV 1750 mg x 1 after today's HD session Ceftazidime 2g qHD F/u renal function, LOT, C&S, clinical course PreHD vanc level at [place] will dose future vanc doses after assessing patient's HD schedule  Angela Burke, PharmD Pharmacy Resident Pager: [phone] 03/09/2015,2:47 PM

## 2015-03-09 NOTE — Progress Notes (Addendum)
Patient states she has a dry cough and requesting cough medicine. Lung sound rhonchus bilat. Teaching service paged. [facility] [employer]

## 2015-03-10 ENCOUNTER — Inpatient Hospital Stay (HOSPITAL_COMMUNITY): Payer: Medicaid Other

## 2015-03-10 DIAGNOSIS — J189 Pneumonia, unspecified organism: Secondary | ICD-10-CM | POA: Insufficient documentation

## 2015-03-10 DIAGNOSIS — Y95 Nosocomial condition: Secondary | ICD-10-CM

## 2015-03-10 LAB — PROTIME-INR
INR: 2.79 — AB (ref 0.00–1.49)
PROTHROMBIN TIME: 29 s — AB (ref 11.6–15.2)

## 2015-03-10 MED ORDER — DEXTROSE 5 % IV SOLN
2.0000 g | INTRAVENOUS | Status: DC
  Administered 2015-03-12 – 2015-03-14 (×2): 2 g via INTRAVENOUS
  Filled 2015-03-10 (×3): qty 2

## 2015-03-10 NOTE — Progress Notes (Signed)
ANTICOAGULATION/ANTIBIOTIC CONSULT NOTE - Follow Up Consult  Pharmacy Consult for Warfarin/Ceftaz Indication: Hx CVA and hypercoagulable state/HCAP  Allergies  Allergen Reactions  . Food Swelling    Red peppers    Patient Measurements: Height: 5\' 3"  (160 cm) Weight: 171 lb 15.3 oz (78 kg) IBW/kg (Calculated) : 52.4  Vital Signs: Temp: 98.1 F (36.7 C) (12/11 0651) Temp Source: Oral (12/11 0651) BP: 120/71 mmHg (12/11 0651) Pulse Rate: 96 (12/11 0651)  Labs:  Recent Labs  03/08/15 0319  03/08/15 1536 03/09/15 0428 03/09/15 1919 03/10/15 0544  HGB 7.4*  --   --  7.8* 8.1*  --   HCT 22.5*  --   --  24.6* 25.7*  --   PLT 11*  --  7* 23* 21*  --   APTT  --   --  41*  --   --   --   LABPROT  --   < > 27.8* 31.6*  --  29.0*  INR  --   < > 2.64* 3.12*  --  2.79*  CREATININE  --   --   --  3.80* 1.96*  --   < > = values in this interval not displayed.  Estimated Creatinine Clearance: 38.1 mL/min (by C-G formula based on Cr of 1.96).   Assessment: 42 YOF with ESRD on Coumadin for history of SLE, stroke, and hypercoagulable state. Warfarin on hold since 12/2 due to rise in INR, hgb drop and worsening thrombocytopenia. Warfarin x 1 dose was given on 12/6. Patient also on ceftazidime for potential pneumonia. Vancomycin has been discontinued    *This patient was discussed with the IMTS on 12/8 - there is some literature to state holding warfarin until platelets recover to >50 due to increased risk of bleeding and spontaneous hemorrhage. The team was okay with holding the warfarin for now*  Pharmacy will plan to restart warfarin when platelets recover to >50. INR remains therapeutic at 2.79 today, plt 5>>11>>23>>21. Hgb 8.1.   Goal of Therapy:  INR 2-3 Monitor platelets by anticoagulation protocol: Yes   Plan:  -Continue to hold warfarin -Will trend platelet recovery and plan to resume when platelets >50 -Will continue to monitor for signs and symptoms of  bleeding  -Ceftazidime 2g has been scheduled for TThSat HD schedule -Pharmacy will follow HD schedule for abx dosing  Angela Burke, PharmD Pharmacy Resident Pager: [phone]  03/10/2015 8:14 AM

## 2015-03-10 NOTE — Progress Notes (Signed)
Subjective: Mrs [name] has had no episodes of recurrent bleeding since 12/8. Has remained afebrile, with hypotensive episode during HD yesterday, but otherwise hemodynamically stable. She still has a dry cough with chest congestion x 3 days, with clear rhinorrhea, but it is improving since yesterday. She denies fevers, chills, malaise, myalgias, chest pain, shortness of breath, palpitations, abdominal pain, focal weakness, or increased leg swelling. She continues to have intermittent swallowing difficulties without pain, 'stuck' feeling, dry mouth or reflux symptoms, relatively unchanged despite nystatin.  Objective: Vital signs in last 24 hours: Filed Vitals:   03/09/15 1923 03/09/15 2047 03/10/15 0649 03/10/15 0651  BP: 124/71 136/76  120/71  Pulse: 96 97  96  Temp: 97.8 F (36.6 C) 97.7 F (36.5 C)  98.1 F (36.7 C)  TempSrc: Oral Oral  Oral  Resp: 16 20    Height:      Weight:   171 lb 15.3 oz (78 kg)   SpO2: 90% 99%  98%   Weight change: 0 lb (0 kg)  Intake/Output Summary (Last 24 hours) at 03/10/15 1054 Last data filed at 03/10/15 0000  Gross per 24 hour  Intake   1010 ml  Output   1166 ml  Net   -156 ml   General Apperance: NAD HEENT: Normocephalic, atraumatic, PERRL, EOMI, anicteric sclera Neck: Supple, trachea midline Lungs: Normal effort, rhonchi bilaterally. No wheezes or other breath sounds auscultated. Heart: Regular rate and rhythm Abdomen: Soft, nontender, nondistended, no rebound/guarding Extremities: Warm and well perfused, 2+ edema of the left thigh around graft site, 1+ distal LLE edema. Scattered ecchymoses in the R antecubital fossa. Pulses: 1+ throughout Skin: No rashes or lesions Neurologic: Alert and oriented x 3. No gross deficits. Psych: Improved affect, still flat but less so than previous exams.   Lab Results: Basic Metabolic Panel:  Recent Labs Lab 03/09/15 0428 03/09/15 1919  NA 135 136  K 4.6 3.2*  CL 99* 98*  CO2 26 28  GLUCOSE 98  80  BUN 26* 8  CREATININE 3.80* 1.96*  CALCIUM 8.5* 8.0*  PHOS 3.8 1.8*   Liver Function Tests:  Recent Labs Lab 03/09/15 0428 03/09/15 1919  ALBUMIN 1.9* 2.8*   CBC:  Recent Labs Lab 03/06/15 0906 03/07/15 0811  03/09/15 0428 03/09/15 1919  WBC 18.3* 15.4*  < > 13.7* 9.9  NEUTROABS 15.6* 13.8*  --   --   --   HGB 9.5* 9.0*  < > 7.8* 8.1*  HCT 28.4* 27.6*  < > 24.6* 25.7*  MCV 89.0 89.9  < > 92.8 93.5  PLT 17* 5*  < > 23* 21*  < > = values in this interval not displayed. Coagulation:  Recent Labs Lab 03/08/15 0530 03/08/15 1536 03/09/15 0428 03/10/15 0544  LABPROT 24.9* 27.8* 31.6* 29.0*  INR 2.28* 2.64* 3.12* 2.79*    Micro Results: Recent Results (from the past 240 hour(s))  MRSA PCR Screening     Status: None   Collection Time: 03/04/15  8:02 PM  Result Value Ref Range Status   MRSA by PCR NEGATIVE NEGATIVE Final    Comment:        The GeneXpert MRSA Assay (FDA approved for NASAL specimens only), is one component of a comprehensive MRSA colonization surveillance program. It is not intended to diagnose MRSA infection nor to guide or monitor treatment for MRSA infections.   MRSA PCR Screening     Status: None   Collection Time: 03/09/15  7:48 PM  Result Value Ref  Range Status   MRSA by PCR NEGATIVE NEGATIVE Final    Comment:        The GeneXpert MRSA Assay (FDA approved for NASAL specimens only), is one component of a comprehensive MRSA colonization surveillance program. It is not intended to diagnose MRSA infection nor to guide or monitor treatment for MRSA infections.    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   Acute on chronic anemia and thrombocytopenia: Most likely 2/2 lupus flare as pt has history of similar episodes but Unasyn is also a  potential precipitant. Platelet nadir of <5. 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 LDH only mildly elevated with inappropriately low retic, likely not hemolyzing currently and may be more of a hypoproliferative process. C3 is decreased, C4 normal. dsDNA 10. Likely 2/2 lupus flare. Currently not responding well to steroid treatment with moderate bleeding. -Day 35 of IVIG with romiplastim support for steroid-refractory SLE-induced thrombocytopenia, per hematology recs -s/p dexamethasone 40mg  PO x 4 days (ending 12/9) -Transfused 2U PRBCs and 1U plts on 12/6. 1U plts on 12/7. 2U on 12/8. Transfuse PRN -Consulted case with Dr. Norva Riffle in Rheumatology and Dr. Jana Hakim in Hematology - concordance with current plan -F/u CBCs -Hold coumadin - was given vitamin K on 12/10 for supratherapeutic INR. Continue INR checks and vitamin K PRN, especially given her continued thrombocytopenia.  Hospital-acquired pneumonia - nonproductive cough with clear rhinorrhea and sore throat. CXR showed no consolidation with likely fluid overload pattern possible explaining the cough, much worse from previous imaging. Afebrile, rhonchi on exam. Discussed with Dr. Jonnie Finner on 12/10. Procalcitonin on 12/10 was 6.14, very high and suggestive of a bacterial infection despite more viral s/s. Note that despite her ESRD and lupus, procalcitonin is still well-studied to differentiate SLE flares/viral illness vs bacterial illness with a suggested elevated cutoff of 0.75 (vs 0.5 in non SLE/ESRD). MRSA PCR negative. Symptoms are improving slowly but she is still quite congested on exam. -Continue ceftazidime, day 2/7 for HAP. Also got vanc x 1 on day 1 but d/c with MRSA PCR negative. -Will obtain CT chest today -Continue HD -Hycodin PRN  Swallowing difficulties - difficult to exam oropharynx currently but history suggestive of candidiasis vs functional swallowing difficulties. No other reflux symptoms or esophagitis symptoms,  issue is difficulty initiating swallow x 2 weeks. -Treat empirically with nystatin suspension -If continued no improvement, will need SLP evaluation  Acute psychosis, Depression: She had acute confusion and hallucinations secondary UTI. Her symptoms improved but worsened 12/9. 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 Low suspicion for Lupus cerebritis. She remains afebrile. Repeat blood cultures on 11/30 NGTD. These symptoms continue to resolve with olanzapine. -D/c'ed Unasyn on 12/5 2/2 thrombocytopenia - received 10 days of abx (initially to complete 14 day course from 11/25 (end date: 12/9)) -Continue olanzapine 5mg  BID -Monitor QTc -Continue duloxetine 30mg  QD. -Consulted psychiatry, appreciate recs  ESRD on HD, s/p Left thigh AVG:AVG placed 01/04/2015. Back on her outpatient TTS schedule. She has some left leg edema likely 2/2 AVG. Venous duplex with post surgical changes and no DVT. -Midodrine 10mg  BID -Nephrology following, appreciate recs -HD per nephrology -Percocet 5/325mg  q 6 hours prn pain -Decreased coreg for soft pressures -CXR on 12/9 shows fluid congestion - HD per nephrology  Hx recent R. MCA Embolic CVA -Daily PT/INR -Coumadin per pharm -Continue PT.  CIR recs SNF but pt declines currently and wants home health  SLE: Continue Plaquenil  Hx SVT/Tachycardia: Decreased toprol XL to 12.5mg  2/2 pressures  FEN: Renal VTE ppx: Coumadin  FULL CODE  Dispo: Will likely go to SNF or [facility] upon discharge.  Anticipated discharge in approximately 4-5 day(s).   The patient does have a current PCP Lin Landsman, MD) and does not need an [facility] hospital follow-up appointment after discharge.  The patient does not have transportation limitations that hinder transportation to clinic appointments.   LOS: 19 days   Norval Gable, MD 03/10/2015, 10:54 AM

## 2015-03-10 NOTE — Progress Notes (Signed)
[facility] Progress Note   Subjective: coughing is better today.  UF 1.1 liter on HD yesterday w BP drops into 70's. No new complaints.   Filed Vitals:   03/09/15 1923 03/09/15 2047 03/10/15 0649 03/10/15 0651  BP: 124/71 136/76  120/71  Pulse: 96 97  96  Temp: 97.8 F (36.6 C) 97.7 F (36.5 C)  98.1 F (36.7 C)  TempSrc: Oral Oral  Oral  Resp: 16 20    Height:      Weight:   78 kg (171 lb 15.3 oz)   SpO2: 90% 99%  98%    Inpatient medications: . sodium chloride   Intravenous Once  . sodium chloride   Intravenous Once  . sodium chloride   Intravenous Once  . sodium chloride   Intravenous Once  . B-complex with vitamin C  1 tablet Oral Daily  . [START ON 03/12/2015] cefTAZidime (FORTAZ)  IV  2 g Intravenous Q T,Th,Sa-HD  . Darbepoetin Alfa  200 mcg Intravenous Q Thu-HD  . DULoxetine  40 mg Oral Daily  . feeding supplement (NEPRO CARB STEADY)  237 mL Oral BID BM  . feeding supplement (PRO-STAT SUGAR FREE 64)  30 mL Oral BID  . hydroxychloroquine  400 mg Oral Daily  . IMMUNE GLOBULIN 10% (HUMAN) IV - For Fluid Restriction Only  400 mg/kg Intravenous Q24H  . metoprolol succinate  12.5 mg Oral q1800  . midodrine  10 mg Oral BID WC  . multivitamin  1 tablet Oral BID  . nystatin  5 mL Oral QID  . OLANZapine zydis  10 mg Oral BID PC  . pantoprazole  40 mg Oral Daily  . phytonadione  2.5 mg Oral Once  . sodium chloride  3 mL Intravenous Q12H  . Warfarin - Pharmacist Dosing Inpatient   Does not apply q1800     sodium chloride, sodium chloride, acetaminophen, acetaminophen, alteplase, haloperidol, heparin, HYDROcodone-homatropine, Influenza vac split quadrivalent PF, lidocaine (PF), lidocaine-prilocaine, ondansetron **OR** ondansetron (ZOFRAN) IV, oxyCODONE-acetaminophen, pentafluoroprop-tetrafluoroeth, pneumococcal 23 valent vaccine, polyethylene glycol, sodium chloride  Exam: Chron ill appearing young AAF No jvd Chest L clear, R dec'd BS 1/2 up RRR no  mrg Abd obese soft ntnd  Ext ^bruising UE's L thigh 1-2+ edema, improved L thigh AVG +bruit Neuro bilat LE weak 2/5, nonfocal, Ox 3    [facility] TTS 4 hr 87.5kg 2/2 bath L thigh AVG Hep 2600 Aranesp 200/week  Assessment: 1 Thrombocytopenia severe, for IVIG/ thromboplastin per Hematology , failed decadron trial 2 Cough/ pulm infiltrates - cough better, R base exam abnormal. Couldn't get much fluid off yesterday. Will get chest CT.  Per prim team is on abx now.  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 12 [place] mvi/ Bcomplex 13 SP Acinetobacter UTI  Plan - cont HD TTS   Kelly Splinter MD [facility] pager [phone]    cell [phone] 03/10/2015, 9:44 AM    Recent Labs Lab 03/07/15 0811 03/09/15 0428 03/09/15 1919  NA 134* 135 136  K 4.5 4.6 3.2*  CL 96* 99* 98*  CO2 25 26 28   GLUCOSE 91 98 80  BUN 27* 26* 8  CREATININE 4.82* 3.80* 1.96*  CALCIUM 8.3*  8.5* 8.0*  PHOS 5.3* 3.8 1.8*    Recent Labs Lab 03/07/15 0811 03/09/15 0428 03/09/15 1919  ALBUMIN 1.6* 1.9* 2.8*    Recent Labs Lab 03/05/15 1206 03/06/15 0906 03/07/15 0811 03/08/15 0319 03/08/15 1536 03/09/15 0428 [id number]  WBC DUPLICATE REQUEST  SEE [id number] 18.3* 15.4* 14.8*  --  13.7* 9.9  NEUTROABS 8.2* 15.6* 13.8*  --   --   --   --   HGB DUPLICATE REQUEST  SEE [id number] 9.5* 9.0* 7.4*  --  7.8* 8.1*  HCT DUPLICATE REQUEST  SEE [id number] 28.4* 27.6* 22.5*  --  [id number]* [id number]*  MCV DUPLICATE REQUEST  SEE [id number] 89.0 89.9 90.7  --  [id number]  PLT DUPLICATE REQUEST  SEE [id number] 17* 5* 11* 7* 23* 21*

## 2015-03-11 ENCOUNTER — Inpatient Hospital Stay (HOSPITAL_COMMUNITY): Payer: Medicaid Other

## 2015-03-11 LAB — RENAL FUNCTION PANEL
Albumin: 1.9 g/dL — ABNORMAL LOW (ref 3.5–5.0)
Anion gap: 8 (ref 5–15)
BUN: 22 mg/dL — AB (ref 6–20)
CHLORIDE: 96 mmol/L — AB (ref 101–111)
CO2: 27 mmol/L (ref 22–32)
CREATININE: 3.58 mg/dL — AB (ref 0.44–1.00)
Calcium: 8.6 mg/dL — ABNORMAL LOW (ref 8.9–10.3)
GFR, EST AFRICAN AMERICAN: 17 mL/min — AB (ref >60)
GFR, EST NON AFRICAN AMERICAN: 15 mL/min — AB (ref >60)
Glucose, Bld: 73 mg/dL (ref 65–99)
POTASSIUM: 3 mmol/L — AB (ref 3.5–5.1)
Phosphorus: 3.1 mg/dL (ref 2.5–4.6)
Sodium: 131 mmol/L — ABNORMAL LOW (ref 135–145)

## 2015-03-11 LAB — BODY FLUID CELL COUNT WITH DIFFERENTIAL
Lymphs, Fluid: 27 %
Monocyte-Macrophage-Serous Fluid: 16 % — ABNORMAL LOW (ref 50–90)
Neutrophil Count, Fluid: 57 % — ABNORMAL HIGH (ref 0–25)
WBC FLUID: 1461 uL — AB (ref 0–1000)

## 2015-03-11 LAB — PROCALCITONIN: Procalcitonin: 3.67 ng/mL

## 2015-03-11 LAB — PROTIME-INR
INR: 2.19 — ABNORMAL HIGH (ref 0.00–1.49)
PROTHROMBIN TIME: 24.2 s — AB (ref 11.6–15.2)

## 2015-03-11 LAB — GLUCOSE, SEROUS FLUID: GLUCOSE FL: 50 mg/dL

## 2015-03-11 LAB — PROTEIN, BODY FLUID: Total protein, fluid: 3.5 g/dL

## 2015-03-11 LAB — CBC
HEMATOCRIT: 26.7 % — AB (ref 36.0–46.0)
Hemoglobin: 8.2 g/dL — ABNORMAL LOW (ref 12.0–15.0)
MCH: 29.9 pg (ref 26.0–34.0)
MCHC: 30.7 g/dL (ref 30.0–36.0)
MCV: 97.4 fL (ref 78.0–100.0)
PLATELETS: 122 10*3/uL — AB (ref 150–400)
RBC: 2.74 MIL/uL — ABNORMAL LOW (ref 3.87–5.11)
RDW: 18.6 % — AB (ref 11.5–15.5)
WBC: 8.9 10*3/uL (ref 4.0–10.5)

## 2015-03-11 LAB — LACTATE DEHYDROGENASE: LDH: 250 U/L — ABNORMAL HIGH (ref 98–192)

## 2015-03-11 LAB — LACTATE DEHYDROGENASE, PLEURAL OR PERITONEAL FLUID: LD FL: 244 U/L — AB (ref 3–23)

## 2015-03-11 LAB — GRAM STAIN

## 2015-03-11 LAB — PROTEIN, TOTAL: Total Protein: 6.9 g/dL (ref 6.5–8.1)

## 2015-03-11 MED ORDER — PHYTONADIONE 5 MG PO TABS
10.0000 mg | ORAL_TABLET | Freq: Once | ORAL | Status: AC
  Administered 2015-03-11: 10 mg via ORAL
  Filled 2015-03-11: qty 2

## 2015-03-11 MED ORDER — LIDOCAINE HCL (PF) 1 % IJ SOLN
INTRAMUSCULAR | Status: AC
  Filled 2015-03-11: qty 10

## 2015-03-11 MED ORDER — MIDODRINE HCL 5 MG PO TABS
5.0000 mg | ORAL_TABLET | Freq: Two times a day (BID) | ORAL | Status: DC
  Administered 2015-03-11 – 2015-03-22 (×22): 5 mg via ORAL
  Filled 2015-03-11 (×20): qty 1

## 2015-03-11 NOTE — Progress Notes (Signed)
ANTICOAGULATION CONSULT NOTE - Follow Up Consult  Pharmacy Consult for coumadin Indication: hypercoagulable state 2nd SLE  Allergies  Allergen Reactions  . Food Swelling    Red peppers    Patient Measurements: Height: 5\' 3"  (160 cm) Weight: 171 lb 15.3 oz (78 kg) IBW/kg (Calculated) : 52.4   Vital Signs: Temp: 98 F (36.7 C) (12/12 0520) Temp Source: Oral (12/12 0520) BP: 152/83 mmHg (12/12 0520) Pulse Rate: 98 (12/12 0520)  Labs:  Recent Labs  03/08/15 1536  03/09/15 0428 03/09/15 1919 03/10/15 0544 03/11/15 0913  HGB  --   < > 7.8* 8.1*  --  8.2*  HCT  --   --  24.6* 25.7*  --  26.7*  PLT 7*  --  23* 21*  --  122*  APTT 41*  --   --   --   --   --   LABPROT 27.8*  --  31.6*  --  29.0* 24.2*  INR 2.64*  --  3.12*  --  2.79* 2.19*  CREATININE  --   --  3.80* 1.96*  --  3.58*  < > = values in this interval not displayed.  Estimated Creatinine Clearance: 20.8 mL/min (by C-G formula based on Cr of 3.58).  Assessment: Coumadin PTA for hypercoagulable state, hx of SLE, and new CVA in October. 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 Cards and Hematology had seen outpatient and agreed on started [facility]. Coumadin on hold except Warf x 1 dose was given on 12/6.  *Holding additional warfarin doses until the plts recover to >50 due to bleeding risk. Vitamin K 2.5 po ordered 12/0 - not given - charted that pt refused *INR 2.28>3.12>2.79, plts 5>>11>>23>>21>>122; Hg stable 8.2 PLTC now > 50 but chest CT w/ large B effusions and read rec thoracentesis IM ordered vitamin K 10 mg po,  to have procedure in IR w/ direct imaging 12/9:  NPLATE - given 075-GRM.  Plan is weekly NPlate on Fridays, failed 4 day decadron trial  12/9 HIT ab +,  12/12 HIT SRA ordered  Goal of Therapy:  INR 2-3 Monitor platelets by anticoagulation protocol: Yes   Plan:  - to get vitamin k 10 mg po to reverse coumadin for thoracentesis - continue to hold coumadin for procedure - f/u pltc  Eudelia Bunch, Pharm.D. [id number] 03/11/2015 12:44 PM

## 2015-03-11 NOTE — Progress Notes (Signed)
[facility] Progress Note   Subjective: still coughing, nothing coming up. No SOB, 98% on RA.  CT chest reviewed w radiologist, there are bilat large effusions, pleural-based nodules (which i couldn't make out) and dense bilat perihilar consolidation w air bronchograms and discrete borders. Radiology suggested thoracentesis  Filed Vitals:   03/10/15 1700 03/10/15 1914 03/10/15 2136 03/11/15 0520  BP: 139/78 139/83 142/79 152/83  Pulse: 100 90 97 98  Temp: 97.7 F (36.5 C) 98.4 F (36.9 C) 98.3 F (36.8 C) 98 F (36.7 C)  TempSrc: Oral Oral Oral Oral  Resp: 14 16 18 18   Height:      Weight:      SpO2: 100% 100% 98% 98%    Inpatient medications: . B-complex with vitamin C  1 tablet Oral Daily  . [START ON 03/12/2015] cefTAZidime (FORTAZ)  IV  2 g Intravenous Q T,Th,Sa-HD  . Darbepoetin Alfa  200 mcg Intravenous Q Thu-HD  . DULoxetine  40 mg Oral Daily  . feeding supplement (NEPRO CARB STEADY)  237 mL Oral BID BM  . feeding supplement (PRO-STAT SUGAR FREE 64)  30 mL Oral BID  . hydroxychloroquine  400 mg Oral Daily  . IMMUNE GLOBULIN 10% (HUMAN) IV - For Fluid Restriction Only  400 mg/kg Intravenous Q24H  . metoprolol succinate  12.5 mg Oral q1800  . midodrine  10 mg Oral BID WC  . multivitamin  1 tablet Oral BID  . nystatin  5 mL Oral QID  . OLANZapine zydis  10 mg Oral BID PC  . pantoprazole  40 mg Oral Daily  . phytonadione  2.5 mg Oral Once  . sodium chloride  3 mL Intravenous Q12H  . Warfarin - Pharmacist Dosing Inpatient   Does not apply q1800     acetaminophen, acetaminophen, haloperidol, HYDROcodone-homatropine, Influenza vac split quadrivalent PF, ondansetron **OR** ondansetron (ZOFRAN) IV, oxyCODONE-acetaminophen, pneumococcal 23 valent vaccine, polyethylene glycol, sodium chloride  Exam: Chron ill appearing young AAF No jvd Chest L base dec'd BS,  R dec'd BS 1/2 up RRR no mrg Abd obese soft ntnd  Ext ^bruising UE's L thigh 1-2+ edema,  improved L thigh AVG +bruit Neuro bilat LE weak 2/5, nonfocal, Ox 3  [facility] TTS 4 hr 87.5kg 2/2 bath L thigh AVG Hep 2600 Aranesp 200/week  Assessment: 1 Thrombocytopenia- improving , plt 122 today , sp IVIG/ thromboplastin. Failed decadron trial 2 Cough/ dense bilat perihilar consolidation/ pleural effusions - infiltrates are not vol overload, would pursue other diagnoses (blood/ infection/ inflammation), have d/w primary. Is on IV fortaz now D #3.  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 12 [place] mvi/ Bcomplex 13 SP Acinetobacter UTI  Plan - cont HD TTS   Kelly Splinter MD [facility] pager [phone]    cell [phone] 03/11/2015, 9:49 AM    Recent Labs Lab 03/07/15 0811 03/09/15 0428 03/09/15 1919  NA 134* 135 136  K 4.5 4.6 3.2*  CL 96* 99* 98*  CO2 25 26 28   GLUCOSE 91 98 80  BUN 27* 26* 8  CREATININE 4.82* 3.80* 1.96*  CALCIUM 8.3* 8.5* 8.0*  PHOS 5.3* 3.8 1.8*    Recent Labs Lab 03/07/15 0811 03/09/15 0428 03/09/15 1919  ALBUMIN 1.6* 1.9* 2.8*    Recent Labs Lab 03/05/15 1206 03/06/15 0906 03/07/15 0811  03/09/15 0428 03/09/15 1919 [id number]  WBC DUPLICATE REQUEST  SEE [id number] 18.3* 15.4*  < > 13.7* 9.9 8.9  NEUTROABS 8.2* 15.6* 13.8*  --   --   --   --   HGB DUPLICATE REQUEST  SEE [id number] 9.5* 9.0*  < > 7.8* 8.1* 8.2*  HCT DUPLICATE REQUEST  SEE [id number] 28.4* 27.6*  < > 24.6* [id number]* [id number]*  MCV DUPLICATE REQUEST  SEE [id number] 89.0 89.9  < > 92.8 [id number]  PLT DUPLICATE REQUEST  SEE [id number] 17* 5*  < > 23* 21* 122*  < > = values in this interval not displayed.

## 2015-03-11 NOTE — Procedures (Signed)
   US guided L thoracentesis  300 cc bloody fluid Sent for labs per MD  tolerated well  cxr pending

## 2015-03-11 NOTE — Progress Notes (Signed)
CSW received consult to help pt complete Advanced Directives. Provided pt w/ paperwork and directed pt to contact pastoral care to notarize. Pt's sister was present at bedside.  CSW signing off.  Percell Locus Kimesha Claxton LCSWA [phone]

## 2015-03-11 NOTE — Progress Notes (Addendum)
Physical Therapy Treatment Patient Details Name: Starlyn Gabourel MRN: [id number] DOB: 16-May-1975 Today's Date: 03/11/2015    History of Present Illness Pt is a 39 y.o. F from [place] who has had several recent hospitalizations.  She had a Rt MCA infarction and developed pseudomonal bacteremia from Rt femoral HD line which was treated.  Was doing well in CIR until she acutely developed severe confusion and hallucinations following HD sessions, likely a result of sepsis from Lt ant thigh abscess. Pt's additional PMH includes lupus, fibromyalgia, anemia.    PT Comments    Pt is refusing SNF but is requiring 2 person assist and use of lift equipment(Stedy)  to get OOB. At home she will have to get up into w/c, get to HD, get into recliner at HD, sit in recliner for 4 hours during HD, get into w/c, get back home. Doubt at this point that pt will be able to successfully manage this.   Follow Up Recommendations  Other (comment);SNF (Pt refusing SNF and not eligible for HHPT)     Equipment Recommendations  Other (comment) (Has received w/c and cushion)    Recommendations for Other Services       Precautions / Restrictions Precautions Precautions: Fall Restrictions Weight Bearing Restrictions: No    Mobility  Bed Mobility Overal bed mobility: Needs Assistance;+2 for physical assistance Bed Mobility: Rolling;Supine to Sit Rolling: Min assist;+2 for physical assistance   Supine to sit: Mod assist;+2 for physical assistance     General bed mobility comments: cues for sequencing and assisting with trunk for sitting EOB  Transfers Overall transfer level: Needs assistance Equipment used: Ambulation equipment used Transfers: Sit to/from [employer] Sit to Stand: Mod assist;+2 physical assistance         General transfer comment: Cues for technique, sequencing, hand placement, and enouragement to participate. Assist to bring hips up and to bring trunk up. Pt comes up with  trunk bent forward at ~70 degree angle and needs assist to bring upright.  Ambulation/Gait                 Stairs            Wheelchair Mobility    Modified Rankin (Stroke Patients Only)       Balance Overall balance assessment: Needs assistance Sitting-balance support: No upper extremity supported;Feet supported Sitting balance-Leahy Scale: Fair     Standing balance support: Bilateral upper extremity supported Standing balance-Leahy Scale: Poor Standing balance comment: Stedy and +2 mod A for static standing.                    Cognition Arousal/Alertness: Awake/alert Behavior During Therapy: WFL for tasks assessed/performed Overall Cognitive Status: Impaired/Different from baseline Area of Impairment: Problem solving;Following commands;Awareness;Memory     Memory: Decreased short-term memory Following Commands: Follows one step commands with increased time Safety/Judgement: Decreased awareness of deficits;Decreased awareness of safety Awareness: Intellectual Problem Solving: Slow processing;Requires verbal cues;Requires tactile cues General Comments: Unsure if pt is understanding the difficulty in returning home at DC and having to go to HD 3x/wk.    Exercises      General Comments        Pertinent Vitals/Pain Pain Assessment: Faces Faces Pain Scale: Hurts little more Pain Location: BLE's during sit to stand Pain Descriptors / Indicators: Grimacing Pain Intervention(s): Limited activity within patient's tolerance;Monitored during session;Repositioned    Home Living  Prior Function            PT Goals (current goals can now be found in the care plan section) Progress towards PT goals: Not progressing toward goals - comment (continues to have very low functional activity tolerance)    Frequency  Min 3X/week    PT Plan Discharge plan needs to be updated    Co-evaluation PT/OT/SLP  Co-Evaluation/Treatment: Yes Reason for Co-Treatment: Complexity of the patient's impairments (multi-system involvement);For patient/therapist safety PT goals addressed during session: Mobility/safety with mobility;Balance;Strengthening/ROM OT goals addressed during session: ADL's and self-care;Strengthening/ROM     End of Session Equipment Utilized During Treatment: Gait belt Activity Tolerance: Patient limited by fatigue Patient left: in chair;with call bell/phone within reach;with chair alarm set;with family/visitor present     Time: 1020-1045 PT Time Calculation (min) (ACUTE ONLY): 25 min  Charges:  $Therapeutic Activity: 8-22 mins                    G Codes:      Kadience Macchi 2015-03-19, 3:17 PM [employer] PT [phone]

## 2015-03-11 NOTE — Progress Notes (Signed)
Occupational Therapy Treatment Patient Details Name: Carlos Hilt MRN: [id number] DOB: [birth year] Today's Date: 03/11/2015    History of present illness Pt is a 39 y.o. F from CIR who has had several recent hospitalizations.  She had a Rt MCA infarction and developed pseudomonal bacteremia from Rt femoral HD line which was treated.  Was doing well in CIR until she acutely developed severe confusion and hallucinations following HD sessions, likely a result of sepsis from Lt ant thigh abscess. Pt's additional PMH includes lupus, fibromyalgia, anemia.   OT comments  Pt making slow progress towards OT goals, will continue plan of care for now. Pt would benefit from ST-SNF prior to discharge home. Pt weak and deconditioned, she was unable to stand with RW during this OT/PT co-treat. Used stedy for sit to/from stand and for transfer into recliner. Pt in stedy for a few minutes to sit (in stedy) at sink for grooming task of brushing teeth.    Follow Up Recommendations  Supervision/Assistance - 24 hour;SNF (as of now, pt is refusing SNF )    Equipment Recommendations  Tub/shower bench;Wheelchair (measurements OT);Hospital bed    Recommendations for Other Services  None at this time   Precautions / Restrictions Precautions Precautions: Fall Restrictions Weight Bearing Restrictions: No    Mobility Bed Mobility Overal bed mobility: Needs Assistance;+2 for physical assistance Bed Mobility: Rolling;Supine to Sit Rolling: Min assist;+2 for physical assistance   Supine to sit: Mod assist;+2 for physical assistance     General bed mobility comments: cues for sequencing and assisting with trunk and BLEs for sitting EOB  Transfers Overall transfer level: Needs assistance Equipment used: Ambulation equipment used Transfers: Sit to/from Stand Sit to Stand: Mod assist;+2 physical assistance General transfer comment: Cues for technique, sequencing, hand placement, and enouragement to  participate.     Balance Overall balance assessment: Needs assistance Sitting-balance support: No upper extremity supported Sitting balance-Leahy Scale: Fair     Standing balance support: Bilateral upper extremity supported Standing balance-Leahy Scale: Poor Standing balance comment: significant assist in stedy, pt unable to stand with RW   ADL Overall ADL's : Needs assistance/impaired Eating/Feeding: Set up;Sitting   Grooming: Oral care;Sitting;Minimal assistance Grooming Details (indicate cue type and reason): in stedy at sink General ADL Comments: Pt required stedy for sit to/from stands. Attempted using RW, but pt unable secondary to decreased strength throughout BLEs. Pt limited by fatigue this session, and unable to fully participate. Pt required mod encouragement to perform grooming tasks at sink in stedy lift.      Cognition   Behavior During Therapy: WFL for tasks assessed/performed Overall Cognitive Status: Impaired/Different from baseline Area of Impairment: Problem solving;Following commands;Awareness;Memory     Memory: Decreased short-term memory  Following Commands: Follows one step commands with increased time Safety/Judgement: Decreased awareness of deficits;Decreased awareness of safety Awareness: Intellectual Problem Solving: Slow processing;Requires verbal cues;Requires tactile cues General Comments: Pt not as confused, but pt is refusing SNF. She has to use stedy for all OOB mobility, pt unable to stand with RW.                  Pertinent Vitals/ Pain       Pain Assessment: Faces Faces Pain Scale: Hurts little more Pain Location: BLEs during sit to/from stands Pain Descriptors / Indicators: Aching;Grimacing;Guarding Pain Intervention(s): Limited activity within patient's tolerance;Monitored during session;Repositioned   Frequency Min 2X/week     Progress Toward Goals  OT Goals(current goals can now befound in the care plan section)  Progress  towards OT goals: Progressing toward goals     Plan Discharge plan needs to be updated    Co-evaluation    PT/OT/SLP Co-Evaluation/Treatment: Yes Reason for Co-Treatment: Complexity of the patient's impairments (multi-system involvement);For patient/therapist safety   OT goals addressed during session: ADL's and self-care;Strengthening/ROM      End of Session Equipment Utilized During Treatment: Gait belt   Activity Tolerance Patient limited by fatigue   Patient Left with call bell/phone within reach;in chair;with chair alarm set;with family/visitor present     Time: [id number] OT Time Calculation (min): 36 min  Charges: OT General Charges $OT Visit: 1 Procedure OT Treatments $Self Care/Home Management : 8-22 mins  Legacy Lacivita , MS, OTR/L, CLT Pager: [phone]  03/11/2015, 11:42 AM

## 2015-03-11 NOTE — Progress Notes (Signed)
--   Patient seen and examined at bedside. She had no concerns.  Her vitals stable and were: BP 134/87 mmHg  Pulse 106  Temp(Src) 98.1 F (36.7 C) (Oral)  Resp 18  Ht 5\' 3"  (1.6 m)  Wt 171 lb 15.3 oz (78 kg)  BMI 30.47 kg/m2  SpO2 100%  LMP 01/21/2015  Cardiopulmonary exam unremarkable   She said she was not bleeding- I did not see any bleeding anywhere.  Advised the pt to page [place] for any concerns.

## 2015-03-11 NOTE — Progress Notes (Signed)
Physical Therapy Treatment Patient Details Name: [name] MRN: [id number] DOB: [birth year] Today's Date: 03/11/2015    History of Present Illness Pt is a 39 y.o. F from [place] who has had several recent hospitalizations.  She had a Rt MCA infarction and developed pseudomonal bacteremia from Rt femoral HD line which was treated.  Was doing well in CIR until she acutely developed severe confusion and hallucinations following HD sessions, likely a result of sepsis from Lt ant thigh abscess. Pt's additional PMH includes lupus, fibromyalgia, anemia.    PT Comments    Pt only tolerated 45 minutes in recliner prior to returning back to bed. Very heavy assist and use of Stedy to get pt back to bed. Do not think pt is realistic about being able to return home.  Follow Up Recommendations  Other (comment);SNF (Pt refusing SNF and not eligible for HHPT)     Equipment Recommendations  Other (comment) (Has received w/c and cushion)    Recommendations for Other Services       Precautions / Restrictions Precautions Precautions: Fall Restrictions Weight Bearing Restrictions: No    Mobility  Bed Mobility Overal bed mobility: Needs Assistance Bed Mobility: Sit to Supine Rolling: Min assist;+2 for physical assistance   Supine to sit: Mod assist;+2 for physical assistance Sit to supine: +2 for physical assistance;Max assist   General bed mobility comments: Assist to lower trunk and to bring legs up onto bed.  Transfers Overall transfer level: Needs assistance Equipment used: Ambulation equipment used Transfers: Sit to/from Stand Sit to Stand: +2 physical assistance;Total assist         General transfer comment: Very heavy assist to get hips up from low recliner and to get trunk upright. Used bed pad under pt to bring her hips up.  Ambulation/Gait                 Stairs            Wheelchair Mobility    Modified Rankin (Stroke Patients Only)       Balance  Overall balance assessment: Needs assistance Sitting-balance support: No upper extremity supported;Feet supported Sitting balance-Leahy Scale: Fair     Standing balance support: Bilateral upper extremity supported Standing balance-Leahy Scale: Zero Standing balance comment: 2 person total assist and Stedy                     Cognition Arousal/Alertness: Awake/alert Behavior During Therapy: WFL for tasks assessed/performed Overall Cognitive Status: Impaired/Different from baseline Area of Impairment: Problem solving;Following commands;Awareness;Memory     Memory: Decreased short-term memory Following Commands: Follows one step commands with increased time Safety/Judgement: Decreased awareness of deficits;Decreased awareness of safety Awareness: Intellectual Problem Solving: Slow processing;Requires verbal cues;Requires tactile cues General Comments: Unsure if pt is understanding the difficulty in returning home at DC and having to go to HD 3x/wk.    Exercises      General Comments        Pertinent Vitals/Pain Pain Assessment: Faces Faces Pain Scale: Hurts little more Pain Location: BLE's during sit to stand Pain Descriptors / Indicators: Grimacing Pain Intervention(s): Limited activity within patient's tolerance;Repositioned    Home Living                      Prior Function            PT Goals (current goals can now be found in the care plan section) Progress towards PT goals: Not progressing toward goals -  comment    Frequency  Min 3X/week    PT Plan Discharge plan needs to be updated    Co-evaluation PT/OT/SLP Co-Evaluation/Treatment: Yes Reason for Co-Treatment: Complexity of the patient's impairments (multi-system involvement);For patient/therapist safety PT goals addressed during session: Mobility/safety with mobility;Balance;Strengthening/ROM OT goals addressed during session: ADL's and self-care;Strengthening/ROM     End of Session  Equipment Utilized During Treatment: Gait belt Activity Tolerance: Patient limited by fatigue Patient left: with call bell/phone within reach;with family/visitor present;in bed;with bed alarm set     Time: 1129-1140 PT Time Calculation (min) (ACUTE ONLY): 11 min  Charges:  $Therapeutic Activity: 8-22 mins                    G Codes:      Erasto Sleight 2015-04-02, 3:23 PM [facility] PT [phone]

## 2015-03-11 NOTE — Progress Notes (Signed)
Subjective: [name] has had no episodes of recurrent bleeding since 12/8. Has remained afebrile, and otherwise hemodynamically stable. She still has a dry cough with chest congestion x 3 days, with clear rhinorrhea, but is much improved, only coughing 2 times overnight and feeling much less congested.. She denies fevers, chills, malaise, myalgias, chest pain, shortness of breath, palpitations, abdominal pain, urinary or bowel symptoms, focal weakness, or increased leg swelling. She did lose IV access overnight again so labs were unable to be drawn this morning.  Objective: Vital signs in last 24 hours: Filed Vitals:   03/10/15 1700 03/10/15 1914 03/10/15 2136 03/11/15 0520  BP: 139/78 139/83 142/79 152/83  Pulse: 100 90 97 98  Temp: 97.7 F (36.5 C) 98.4 F (36.9 C) 98.3 F (36.8 C) 98 F (36.7 C)  TempSrc: Oral Oral Oral Oral  Resp: 14 16 18 18   Height:      Weight:      SpO2: 100% 100% 98% 98%   Weight change:   Intake/Output Summary (Last 24 hours) at 03/11/15 1106 Last data filed at 03/10/15 1914  Gross per 24 hour  Intake     90 ml  Output      0 ml  Net     90 ml   General Apperance: NAD HEENT: Normocephalic, atraumatic, PERRL, EOMI, anicteric sclera Neck: Supple, trachea midline Lungs: Normal effort, rhonchi bilaterally. No wheezes or other breath sounds auscultated. Heart: Regular rate and rhythm Abdomen: Soft, nontender, nondistended, no rebound/guarding Extremities: Warm and well perfused, 2+ edema of the left thigh around graft site, 1+ distal LLE edema. Bandages in L femoral are c/d/i. Scattered ecchymoses in the R antecubital fossa. Bandages over R hand at previous sticks. Pulses: 1+ throughout Skin: No rashes or lesions Neurologic: Alert and oriented x 3. No gross deficits. Psych: Improved affect, still flat but less so than previous exams.   Lab Results: Basic Metabolic Panel:  Recent Labs Lab 03/09/15 1919 03/11/15 0913  NA 136 131*  K 3.2* 3.0*    CL 98* 96*  CO2 28 27  GLUCOSE 80 73  BUN 8 22*  CREATININE 1.96* 3.58*  CALCIUM 8.0* 8.6*  PHOS 1.8* 3.1   Liver Function Tests:  Recent Labs Lab 03/09/15 1919 03/11/15 0913  ALBUMIN 2.8* 1.9*   CBC:  Recent Labs Lab 03/06/15 0906 03/07/15 0811  03/09/15 1919 03/11/15 0913  WBC 18.3* 15.4*  < > 9.9 8.9  NEUTROABS 15.6* 13.8*  --   --   --   HGB 9.5* 9.0*  < > 8.1* 8.2*  HCT 28.4* 27.6*  < > 25.7* 26.7*  MCV 89.0 89.9  < > 93.5 97.4  PLT 17* 5*  < > 21* 122*  < > = values in this interval not displayed. Coagulation:  Recent Labs Lab 03/08/15 1536 03/09/15 0428 03/10/15 0544 03/11/15 0913  LABPROT 27.8* 31.6* 29.0* 24.2*  INR 2.64* 3.12* 2.79* 2.19*    Micro Results: Recent Results (from the past 240 hour(s))  MRSA PCR Screening     Status: None   Collection Time: 03/04/15  8:02 PM  Result Value Ref Range Status   MRSA by PCR NEGATIVE NEGATIVE Final    Comment:        The GeneXpert MRSA Assay (FDA approved for NASAL specimens only), is one component of a comprehensive MRSA colonization surveillance program. It is not intended to diagnose MRSA infection nor to guide or monitor treatment for MRSA infections.   MRSA PCR Screening  Status: None   Collection Time: 03/09/15  7:48 PM  Result Value Ref Range Status   MRSA by PCR NEGATIVE NEGATIVE Final    Comment:        The GeneXpert MRSA Assay (FDA approved for NASAL specimens only), is one component of a comprehensive MRSA colonization surveillance program. It is not intended to diagnose MRSA infection nor to guide or monitor treatment for MRSA infections.    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   Acute on chronic anemia and thrombocytopenia: Most likely 2/2 lupus flare as pt has history of similar  episodes but Unasyn is also a potential precipitant. Platelet nadir of <5. 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 LDH only mildly elevated with inappropriately low retic, likely not hemolyzing currently and may be more of a hypoproliferative process. C3 is decreased, C4 normal. dsDNA 10. Likely 2/2 lupus flare. Did not respond well to steroid treatment with moderate bleeding. -Day 4/5 of IVIG with romiplastim support for steroid-refractory SLE-induced thrombocytopenia, per hematology recs -s/p dexamethasone 40mg  PO x 4 days (ending 12/9) -Transfused 2U PRBCs and 1U plts on 12/6. 1U plts on 12/7. 2U on 12/8. Transfuse PRN -Consulted case with Dr. Norva Riffle in Rheumatology and Dr. Jana Hakim in Hematology - concordance with current plan -F/u CBCs -Hold coumadin - was given vitamin K on 12/10 for supratherapeutic INR. Continue INR checks and vitamin K PRN, especially given her continued thrombocytopenia.  Hospital-acquired pneumonia - nonproductive cough with clear rhinorrhea and sore throat. CXR showed no consolidation with likely fluid overload pattern possible explaining the cough, much worse from previous imaging. Afebrile, rhonchi on exam. Discussed with Dr. Jonnie Finner on 12/10. Procalcitonin on 12/10 was 6.14, very high and suggestive of a bacterial infection despite more viral s/s. Note that despite her ESRD and lupus, procalcitonin is still well-studied to differentiate SLE flares/viral illness vs bacterial illness with a suggested elevated cutoff of 0.75 (vs 0.5 in non SLE/ESRD). MRSA PCR negative. CT showed bilateral pleural effusions, pleural nodular thickening and adenopathy vs nodule in the median L lung. Symptoms are improving significant and is less congested on exam. Likely 2/2 SLE given effusion with nodular pleural involvement, but parapneumonic effusion is also a consideration. -Continue ceftazidime, day 3/7 for HAP. Also got vanc x 1 on day 1 but d/c with MRSA PCR negative. -Will hold  off on pleural fluid analysis for the time being given her improving clinical picture and coagulation issues but will need to monitor closely. -Continue HD -Hycodin PRN  Swallowing difficulties - difficult to exam oropharynx currently but history suggestive of candidiasis vs functional swallowing difficulties. No other reflux symptoms or esophagitis symptoms, issue is difficulty initiating swallow x 2 weeks. -Treat empirically with nystatin suspension -If continued no improvement, will need SLP evaluation  Acute psychosis, Depression: She had acute confusion and hallucinations secondary UTI. Her symptoms improved but worsened 12/9. 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 Low suspicion for Lupus cerebritis. She remains afebrile. Repeat blood cultures on 11/30 NGTD. These symptoms continue to resolve with olanzapine. -D/c'ed Unasyn on 12/5 2/2 thrombocytopenia - received 10 days of abx (initially to complete 14 day course from 11/25 (end date: 12/9)) -Continue olanzapine 5mg  BID -Monitor QTc -Continue duloxetine 30mg  QD. -Consulted psychiatry, appreciate recs  ESRD on HD, s/p Left thigh AVG:AVG placed 01/04/2015. Back on her outpatient TTS  schedule. She has some left leg edema likely 2/2 AVG. Venous duplex with post surgical changes and no DVT. -Midodrine 10mg  BID -Nephrology following, appreciate recs -HD per nephrology -Percocet 5/325mg  q 6 hours prn pain -Decreased coreg for soft pressures -CXR on 12/9 shows fluid congestion - HD per nephrology  Hx recent R. MCA Embolic CVA -Daily PT/INR -Coumadin per pharm -Continue PT. CIR recs SNF but pt declines currently and wants home health  SLE: Continue Plaquenil  Hx SVT/Tachycardia: Decreased toprol XL to 12.5mg  2/2 pressures  FEN: Renal VTE ppx: Coumadin  FULL CODE  Dispo: Will likely go to SNF or [facility] upon discharge.  Anticipated discharge in approximately 2-3 day(s).   The patient does have a current PCP Eileen Landsman, MD) and does not need an [facility] hospital follow-up appointment after discharge.  The patient does not have transportation limitations that hinder transportation to clinic appointments.   LOS: 20 days   Norval Gable, MD 03/11/2015, 11:06 AM

## 2015-03-12 DIAGNOSIS — R0902 Hypoxemia: Secondary | ICD-10-CM

## 2015-03-12 DIAGNOSIS — R222 Localized swelling, mass and lump, trunk: Secondary | ICD-10-CM

## 2015-03-12 DIAGNOSIS — J869 Pyothorax without fistula: Secondary | ICD-10-CM

## 2015-03-12 LAB — PH, BODY FLUID: pH, Body Fluid: 7.6

## 2015-03-12 LAB — RENAL FUNCTION PANEL
ALBUMIN: 1.8 g/dL — AB (ref 3.5–5.0)
ANION GAP: 9 (ref 5–15)
Albumin: 1.6 g/dL — ABNORMAL LOW (ref 3.5–5.0)
Anion gap: 7 (ref 5–15)
BUN: 29 mg/dL — AB (ref 6–20)
BUN: 29 mg/dL — ABNORMAL HIGH (ref 6–20)
CALCIUM: 8.8 mg/dL — AB (ref 8.9–10.3)
CALCIUM: 8.5 mg/dL — AB (ref 8.9–10.3)
CO2: 26 mmol/L (ref 22–32)
CO2: 26 mmol/L (ref 22–32)
CREATININE: 4.45 mg/dL — AB (ref 0.44–1.00)
CREATININE: 4.55 mg/dL — AB (ref 0.44–1.00)
Chloride: 97 mmol/L — ABNORMAL LOW (ref 101–111)
Chloride: 97 mmol/L — ABNORMAL LOW (ref 101–111)
GFR calc Af Amer: 13 mL/min — ABNORMAL LOW (ref >60)
GFR calc non Af Amer: 12 mL/min — ABNORMAL LOW (ref >60)
GFR, EST AFRICAN AMERICAN: 13 mL/min — AB (ref >60)
GFR, EST NON AFRICAN AMERICAN: 11 mL/min — AB (ref >60)
GLUCOSE: 69 mg/dL (ref 65–99)
Glucose, Bld: 70 mg/dL (ref 65–99)
PHOSPHORUS: 3.1 mg/dL (ref 2.5–4.6)
POTASSIUM: 2.9 mmol/L — AB (ref 3.5–5.1)
Phosphorus: 3.2 mg/dL (ref 2.5–4.6)
Potassium: 2.9 mmol/L — ABNORMAL LOW (ref 3.5–5.1)
SODIUM: 132 mmol/L — AB (ref 135–145)
SODIUM: 130 mmol/L — AB (ref 135–145)

## 2015-03-12 LAB — CBC
HCT: 27.4 % — ABNORMAL LOW (ref 36.0–46.0)
HCT: 23.9 % — ABNORMAL LOW (ref 36.0–46.0)
Hemoglobin: 8.1 g/dL — ABNORMAL LOW (ref 12.0–15.0)
Hemoglobin: 7.1 g/dL — ABNORMAL LOW (ref 12.0–15.0)
MCH: 28.7 pg (ref 26.0–34.0)
MCH: 28.9 pg (ref 26.0–34.0)
MCHC: 29.6 g/dL — AB (ref 30.0–36.0)
MCHC: 29.7 g/dL — ABNORMAL LOW (ref 30.0–36.0)
MCV: 97.2 fL (ref 78.0–100.0)
MCV: 97.2 fL (ref 78.0–100.0)
PLATELETS: 150 10*3/uL (ref 150–400)
Platelets: 140 10*3/uL — ABNORMAL LOW (ref 150–400)
RBC: 2.82 MIL/uL — ABNORMAL LOW (ref 3.87–5.11)
RBC: 2.46 MIL/uL — AB (ref 3.87–5.11)
RDW: 18.8 % — AB (ref 11.5–15.5)
RDW: 18.3 % — ABNORMAL HIGH (ref 11.5–15.5)
WBC: 8.5 10*3/uL (ref 4.0–10.5)
WBC: 8.2 10*3/uL (ref 4.0–10.5)

## 2015-03-12 LAB — PROTIME-INR
INR: 1.3 (ref 0.00–1.49)
PROTHROMBIN TIME: 16.3 s — AB (ref 11.6–15.2)

## 2015-03-12 MED ORDER — HEPARIN SODIUM (PORCINE) 1000 UNIT/ML DIALYSIS: 1000.0000 [IU] | INTRAMUSCULAR | Status: DC | PRN

## 2015-03-12 MED ORDER — SODIUM CHLORIDE 0.9 % IV SOLN: 100.0000 mL | INTRAVENOUS | Status: DC | PRN

## 2015-03-12 MED ORDER — ALTEPLASE 2 MG IJ SOLR
2.0000 mg | Freq: Once | INTRAMUSCULAR | Status: DC | PRN
  Filled 2015-03-12: qty 2

## 2015-03-12 MED ORDER — PENTAFLUOROPROP-TETRAFLUOROETH EX AERO: 1.0000 "application " | INHALATION_SPRAY | CUTANEOUS | Status: DC | PRN

## 2015-03-12 MED ORDER — LIDOCAINE-PRILOCAINE 2.5-2.5 % EX CREA
1.0000 "application " | TOPICAL_CREAM | CUTANEOUS | Status: DC | PRN
  Filled 2015-03-12: qty 5

## 2015-03-12 MED ORDER — LIDOCAINE HCL (PF) 1 % IJ SOLN
5.0000 mL | INTRAMUSCULAR | Status: DC | PRN
  Filled 2015-03-12: qty 5

## 2015-03-12 NOTE — Progress Notes (Signed)
ANTICOAGULATION CONSULT NOTE - Follow Up Consult  Pharmacy Consult for coumadin Indication: hypercoagulable state 2nd SLE  Allergies  Allergen Reactions  . Food Swelling    Red peppers    Patient Measurements: Height: 5\' 3"  (160 cm) Weight: 177 lb 4 oz (80.4 kg) IBW/kg (Calculated) : 52.4   Vital Signs: Temp: 98.6 F (37 C) (12/13 0526) Temp Source: Oral (12/13 0526) BP: 127/82 mmHg (12/13 0526) Pulse Rate: 114 (12/13 0526)  Labs:  Recent Labs  03/09/15 1919 03/10/15 0544 03/11/15 0913 03/12/15 0655  HGB 8.1*  --  8.2* 8.1*  HCT 25.7*  --  26.7* 27.4*  PLT 21*  --  122* 140*  LABPROT  --  29.0* 24.2* 16.3*  INR  --  2.79* 2.19* 1.30  CREATININE 1.96*  --  3.58* 4.45*    Estimated Creatinine Clearance: 17 mL/min (by C-G formula based on Cr of 4.45).  Assessment: Coumadin PTA for hypercoagulable state, hx of SLE, and new CVA in October. 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 Cards and Hematology had seen outpatient and agreed on started [facility]. Coumadin on hold except Warf x 1 dose was given on 12/6.  *Holding additional warfarin doses until the plts recover to >50 due to bleeding risk. Vitamin K 2.5 po ordered 12/0 - not given - charted that pt refused *INR 2.28>3.12>2.79>2.19>vitamin K 10 mg po>1.3 plts 5>>11>>23>>21>>122>140  Hg stable 8.1 - next Arnanesp due 12/15 12/9:  NPLATE - given 624THL HIT ab +,  12/12 HIT SRA ordered 12/12 s/p thoracentesis 12/13 hold coumadin for possible chest tube  Goal of Therapy:  INR 2-3 Monitor platelets by anticoagulation protocol: Yes   Plan:  -d/w MD, hold coumadin today for possible chest tube - daily INR - f/u pltc  Eudelia Bunch, Pharm.D. BP:[phone] 03/12/2015 8:45 AM

## 2015-03-12 NOTE — Progress Notes (Signed)
[facility] Progress Note   Subjective: SOB this am, still coughing  Filed Vitals:   03/12/15 0915 03/12/15 0925 03/12/15 0947 03/12/15 1000  BP: 129/85 148/87 117/70 132/63  Pulse: 120 124 130 126  Temp:      TempSrc:      Resp:      Height:      Weight:      SpO2:        Inpatient medications: . B-complex with vitamin C  1 tablet Oral Daily  . cefTAZidime (FORTAZ)  IV  2 g Intravenous Q T,Th,Sa-HD  . Darbepoetin Alfa  200 mcg Intravenous Q Thu-HD  . DULoxetine  40 mg Oral Daily  . feeding supplement (NEPRO CARB STEADY)  237 mL Oral BID BM  . feeding supplement (PRO-STAT SUGAR FREE 64)  30 mL Oral BID  . hydroxychloroquine  400 mg Oral Daily  . IMMUNE GLOBULIN 10% (HUMAN) IV - For Fluid Restriction Only  400 mg/kg Intravenous Q24H  . metoprolol succinate  12.5 mg Oral q1800  . midodrine  5 mg Oral BID WC  . multivitamin  1 tablet Oral BID  . nystatin  5 mL Oral QID  . OLANZapine zydis  10 mg Oral BID PC  . pantoprazole  40 mg Oral Daily  . sodium chloride  3 mL Intravenous Q12H  . Warfarin - Pharmacist Dosing Inpatient   Does not apply q1800     sodium chloride, sodium chloride, acetaminophen, acetaminophen, alteplase, haloperidol, HYDROcodone-homatropine, Influenza vac split quadrivalent PF, lidocaine (PF), lidocaine-prilocaine, ondansetron **OR** ondansetron (ZOFRAN) IV, oxyCODONE-acetaminophen, pentafluoroprop-tetrafluoroeth, pneumococcal 23 valent vaccine, polyethylene glycol, sodium chloride  Exam: Chron ill appearing young AAF No jvd Chest L base dec'd BS,  R dec'd BS 1/2 up RRR no mrg Abd obese soft ntnd  Ext ^bruising UE's L thigh 1-2+ edema, improved L thigh AVG +bruit Neuro bilat LE weak 2/5, nonfocal, Ox 3  [facility] TTS 4 hr 87.5kg 2/2 bath L thigh AVG Hep 2600 Aranesp 200/week  Assessment: 1 Cough/ dense bilat perihilar consolidation/ pleural effusions - w/u per primary, on IV abx 2 Thrombocytopenia- improving , plts much better , sp  IVIG/ thromboplastin 3 Jeannie Done from prior coum Rx 3 ESRD L thigh graft NO HEP d/t #1 4 Recent cath sepsis 5 SLE plaquenil 6 AMS/ acute psychosis resolved 7 SP CVA/ [place] endocarditis/ hypercoag - off coumadin 8 Debil  9 SVT on MTP 10 Anemia max esa, prbc prn, Hb 8's 11 MBD low Ca bath 12 [place] mvi/ Bcomplex 13 SP Acinetobacter UTI 14 Volume 8 kg under prior dry wt  Plan - HD today, min UF, no HEP   Kelly Splinter MD [facility] pager [phone]    cell [phone] 03/12/2015, 10:33 AM    Recent Labs Lab 03/11/15 0913 03/12/15 0655 03/12/15 0845  NA 131* 132* 130*  K 3.0* 2.9* 2.9*  CL 96* 97* 97*  CO2 27 26 26   GLUCOSE 73 69 70  BUN 22* 29* 29*  CREATININE 3.58* 4.45* 4.55*  CALCIUM 8.6* 8.8* 8.5*  PHOS 3.1 3.2 3.1    Recent Labs Lab 03/11/15 0913 03/11/15 2016 03/12/15 0655 03/12/15 0845  PROT  --  6.9  --   --   ALBUMIN 1.9*  --  1.8* 1.6*    Recent Labs Lab 03/05/15 1206 03/06/15 0906 03/07/15 0811  03/11/15 0913 03/12/15 0655 [id number]  WBC DUPLICATE REQUEST  SEE [id number] 18.3* 15.4*  < > 8.9 8.5 8.2  NEUTROABS 8.2* 15.6*  13.8*  --   --   --   --   HGB DUPLICATE REQUEST  SEE [id number] 9.5* 9.0*  < > 8.2* 8.1* 7.1*  HCT DUPLICATE REQUEST  SEE [id number] 28.4* 27.6*  < > 26.7* [id number]* [id number]*  MCV DUPLICATE REQUEST  SEE [id number] 89.0 89.9  < > 97.4 [id number]  PLT DUPLICATE REQUEST  SEE [id number] 17* 5*  < > 122* 140* 150  < > = values in this interval not displayed.

## 2015-03-12 NOTE — Progress Notes (Signed)
MD informed by RN about patient's BP and HR. No new orders at this time. Will continue to monitor.

## 2015-03-12 NOTE — Progress Notes (Signed)
MD notified by RN about abnormal Hgb and K level. No new orders at this time.Will continue to monitor.

## 2015-03-12 NOTE — Consult Note (Signed)
Name: [name] MRN: [id number] DOB: December 14, 1975    ADMISSION DATE:  02/19/2015 CONSULTATION DATE:  03/21/2015  REFERRING MD :  Graciella Freer   CHIEF COMPLAINT:  Bilateral effusions  BRIEF PATIENT DESCRIPTION: 39 year old female with complete medical history admitted to [facility] 11/22 from CIR with altered mental status. This was thought to be secondary to sepsis from VRE, acinetobacter UTI. She has now developed abnormal chest x-ray with bilateral pleural effusions. PCCM consulted for further evaluation of effusions.  SIGNIFICANT EVENTS  12/2014 > MCA infarct, non-infective endocarditis 01/2015 > pseudomonas bacteremia from femoral HD cath 11/22 > readmit for urosepsis 12/12 > b/l effusions on CT, thoracentesis yielded bloody fluid, exudative in nature 12/13 > pulmonary consult  STUDIES:  12/11 > Bilateral moderate to large size pleural effusion. Bilateral lower lobe significant atelectasis. There is nodular pleural thickening in right lower lobe laterally see axial image 24. Pleural disease or metastatic disease cannot be excluded. There is nodular mass or adenopathy in right upper lobe medially measures at least 3.7 cm. Further correlation with enhanced study is recommended. Pleural fluid analysis also recommended. There is central ground-glass attenuation bilateral upper lobes. Findings suspicious for pulmonary edema or less likely infiltrates.   HISTORY OF PRESENT ILLNESS:  39 year old female with past medical history as below, which is significant for SLE, recent MCA stroke, mitral valve vegetation, bacteremia, end-stage renal disease on dialysis, and sleep apnea. Had a recent right MCA infarct thought to be secondary to non-infectious endocarditis as TEE showed vegetation, but blood cultures were negative. She was treated with long duration antibiotics and anticoagulation. After discharge from that admission she again presented with low-grade fever and blood cultures were  positive for pansensitive pseudomonas which was thought to be secondary to femoral HD access catheter. She was treated with a month of [place]. She developed further left-sided weakness and MRI of the brain revealed expansion of her anterior right MCA territory infarct. She was discharged to [facility] where she was doing well until 11/21 when she developed acute alteration in mental status and hallucinations following HD session. This was thought to be secondary to sepsis with unknown source. She was admitted to [facility]. Workup revealed UTI with acinetobacter and VRE. She has been treated with Unasyn. Hospital course complicated by psychotic symptoms of hallucinations. L leg edema (negative for clot), and depression. She is also on IVIG with romiplastim support for steroid-refractory SLE-induced thrombocytopenia. She developed cough and PNA was made a concern. CT was ordered which showed consolidative process and bilateral effusion. She underwent thoracentesis for this 12/12 which appears to be an exudate. PCCM consulted for further evaluation of effusions.  PAST MEDICAL HISTORY :   has a past medical history of Hypertension; Cardiac arrhythmia; SVT (supraventricular tachycardia) ([place]); Fainting; Shortness of breath; Chest pain at rest; Lupus (systemic lupus erythematosus) ([place]); GERD (gastroesophageal reflux disease); Fibromyalgia; Irritable bowel syndrome (IBS); Hypercholesterolemia; Heart murmur; TIA (transient ischemic attack) (12/21/2014); Sleep apnea; Anemia; History of blood transfusion ("several"); Daily headache; Arthritis; Anxiety; ESRD (end stage renal disease) on dialysis ([place]) (since 12/07/2014); Stroke [facility]); and History of vascular access device.  has past surgical history that includes Cesarean section (2001; 2010); Cervical biopsy (2013); Supraventricular tachycardia ablation (07/21/12 ); Cardiac catheterization (N/A, 12/14/2014); Insertion hybrid anteriovenous gortex graft (Left, 12/18/2014); Ligation of  arteriovenous  fistula (Left, 12/19/2014); Appendectomy (2011); Tubal ligation (2010); TEE without cardioversion (N/A, 12/25/2014); AV fistula placement (Left, 01/04/2015); and TEE without cardioversion (N/A, 01/30/2015). Prior to Admission medications   Medication Sig Start  Date End Date Taking? Authorizing Provider  atorvastatin (LIPITOR) 20 MG tablet Take 1 tablet (20 mg total) by mouth daily at 6 PM. 01/17/15   Lavon Paganini Angiulli, PA-C  Calcium Carbonate Antacid (TUMS PO) Take 1-2 tablets by mouth daily as needed (heartburn).    Historical Provider, MD  cefTAZidime 2 g in dextrose 5 % 50 mL Inject 2 g into the vein Every Tuesday,Thursday,and Saturday with dialysis. 02/04/15   Collier Salina, MD  cyclobenzaprine (FLEXERIL) 10 MG tablet Take 1 tablet (10 mg total) by mouth at bedtime as needed for muscle spasms. 01/17/15   Lavon Paganini Angiulli, PA-C  Darbepoetin Alfa (ARANESP) 200 MCG/0.4ML SOSY injection Inject 0.4 mLs (200 mcg total) into the vein every Thursday with hemodialysis. 02/04/15   Collier Salina, MD  hydroxychloroquine (PLAQUENIL) 200 MG tablet Take 2 tablets (400 mg total) by mouth daily. 01/17/15   Lavon Paganini Angiulli, PA-C  metoprolol succinate (TOPROL-XL) 25 MG 24 hr tablet Take 1 tablet (25 mg total) by mouth at bedtime. 01/17/15   Lavon Paganini Angiulli, PA-C  midodrine (PROAMATINE) 10 MG tablet Take 1 tablet (10 mg total) by mouth 2 (two) times daily with a meal. 02/04/15   Collier Salina, MD  multivitamin (RENA-VIT) TABS tablet Take 1 tablet by mouth at bedtime. 01/17/15   Lavon Paganini Angiulli, PA-C  oxyCODONE-acetaminophen (PERCOCET) 5-325 MG tablet Take 1 tablet by mouth every 6 (six) hours as needed for severe pain. 01/17/15   Lavon Paganini Angiulli, PA-C  pantoprazole (PROTONIX) 20 MG tablet Take 1 tablet (20 mg total) by mouth daily. 01/17/15   Lavon Paganini Angiulli, PA-C  polyethylene glycol (MIRALAX / GLYCOLAX) packet Take 17 g by mouth daily as needed for mild constipation. 02/04/15    Collier Salina, MD  trimethobenzamide (TIGAN) 300 MG capsule Take 1 capsule (300 mg total) by mouth every 8 (eight) hours as needed for nausea/vomiting. 02/04/15   Collier Salina, MD   Allergies  Allergen Reactions  . Food Swelling    Red peppers    FAMILY HISTORY:  family history includes BRCA 1/2 in her sister; Cancer in her mother. SOCIAL HISTORY:  reports that she has never smoked. She has never used smokeless tobacco. She reports that she does not drink alcohol or use illicit drugs.  REVIEW OF SYSTEMS:   Bolds are positive  Constitutional: weight loss, gain, night sweats, Fevers, chills, fatigue .  HEENT: headaches, Sore throat, sneezing, nasal congestion, post nasal drip, Difficulty swallowing, Tooth/dental problems, visual complaints visual changes, ear ache CV:  chest pain, radiates: ,Orthopnea, PND, swelling in lower extremities, dizziness, palpitations, syncope.  GI  heartburn, indigestion, abdominal pain, nausea, vomiting, diarrhea, change in bowel habits, loss of appetite, bloody stools.  Resp: cough, productive: , hemoptysis, dyspnea, chest pain, pleuritic.  Skin: rash or itching or icterus GU: dysuria, change in color of urine, urgency or frequency. flank pain, hematuria  MS: l knee pain or swelling. decreased range of motion  Psych: change in mood or affect. depression or anxiety.  Neuro: difficulty with speech, weakness, numbness, ataxia    SUBJECTIVE:   VITAL SIGNS: Temp:  [97.3 F (36.3 C)-98.6 F (37 C)] 97.3 F (36.3 C) (12/13 0837) Pulse Rate:  [106-130] 109 (12/13 1238) Resp:  [16-20] 19 (12/13 1238) BP: (91-151)/(53-97) 123/59 mmHg (12/13 1238) SpO2:  [99 %-100 %] 100 % (12/13 0837) Weight:  [79.1 kg (174 lb 6.1 oz)-80.4 kg (177 lb 4 oz)] 79.1 kg (174 lb 6.1 oz) (12/13 0837)  PHYSICAL EXAMINATION: General: Obese female in NAD Neuro: Alert, oriented, weak on R HEENT: [place]/AT, no JVD noted Cardiovascular:  RRR, no MRG Lungs: Clear bilateral  breath sounds Abdomen: Soft nontender  Musculoskeletal:  No acute deformity or ROM limitation Skin:  Grossly intact   Recent Labs Lab 03/11/15 0913 03/12/15 0655 03/12/15 0845  NA 131* 132* 130*  K 3.0* 2.9* 2.9*  CL 96* 97* 97*  CO2 27 26 26   BUN 22* 29* 29*  CREATININE 3.58* 4.45* 4.55*  GLUCOSE 73 69 70    Recent Labs Lab 03/11/15 0913 03/12/15 0655 03/12/15 0845  HGB 8.2* 8.1* 7.1*  HCT 26.7* 27.4* 23.9*  WBC 8.9 8.5 8.2  PLT 122* 140* 150   Dg Chest 1 View  03/11/2015  CLINICAL DATA:  39 year old female status post left-sided thoracentesis yielding bloody fluid. EXAM: CHEST 1 VIEW COMPARISON:  Chest x-ray 03/08/2015. FINDINGS: Moderate bilateral pleural effusions (right greater than left) with associated bibasilar opacities which may reflect areas of atelectasis and/or airspace consolidation no pneumothorax. Perihilar consolidation noted on the prior study appears improved on today's examination. No associated cephalization of the pulmonary vasculature. Heart size is upper limits of normal. Upper mediastinal contours are within normal limits. Atherosclerotic calcifications in the arch of the thoracic aorta. IMPRESSION: 1. No postprocedural pneumothorax. 2. Moderate bilateral pleural effusions (right greater than left) with atelectasis and/or consolidation throughout the lung bases bilaterally. 3. Decreasing perihilar airspace consolidation compared to prior examination. 4. Atherosclerosis. Electronically Signed   By: Vinnie Langton M.D.   On: 03/11/2015 15:27   US Thoracentesis Asp Pleural Space W/img Guide  03/11/2015  INDICATION: Symptomatic left sided pleural effusion EXAM: US THORACENTESIS ASP PLEURAL SPACE W/IMG GUIDE COMPARISON:  None. MEDICATIONS: 10 cc 1% lidocaine COMPLICATIONS: None immediate TECHNIQUE: Informed written consent was obtained from the patient after a discussion of the risks, benefits and alternatives to treatment. A timeout was performed prior to  the initiation of the procedure. Initial ultrasound scanning demonstrates a left pleural effusion. The lower chest was prepped and draped in the usual sterile fashion. 1% lidocaine was used for local anesthesia. Under direct ultrasound guidance, a 19 gauge, 7-cm, Yueh catheter was introduced. An ultrasound image was saved for documentation purposes. The thoracentesis was performed. The catheter was removed and a dressing was applied. The patient tolerated the procedure well without immediate post procedural complication. The patient was escorted to have an upright chest radiograph. FINDINGS: A total of approximately 300 cc of bloody fluid was removed. Requested samples were sent to the laboratory. IMPRESSION: Successful ultrasound-guided left sided thoracentesis yielding 300 cc of pleural fluid. Read by:  Lavonia Drafts [facility] Electronically Signed   By: Lucrezia Europe M.D.   On: 03/11/2015 14:59    ASSESSMENT / PLAN:  Bilateral pleural effusions - Suspect parapneumonic. Appears complicated in nature with debris evident in effusion on CT, also mass vs. adenopathy is identified.  - Would most completely be evaluated a drained with VATs procedure - Recommend CVTS consultation  HCAP - Would consider escalating antibiotics due to several recent complex hospitalizations and MDRO infections. Procalcitonin remains elevated. Patient is immunosuppressed on plaquenil.  - Ultimately would recommend Infectious Disease consultation.   Georgann Housekeeper, AGACNP-BC [place] [facility] Pager [phone] or [phone]  03/12/2015 1:31 PM  Attending Note:  39 year old female with a very complex PMH who presents to the hospital with AMS, SOB and fever.  Found to have a parapneumonic effusion.  PCCM consulted for ? Of  placement of chest tube.  Patient is on unasyn.  Discussed with medicine resident.  I reviewed chest CT myself, pleural thickening and debris noted.    Pleural effusion: likely related to  infection with significant debris.  - Recommend CVTS consultation ?VATS.  - Chest tube alone with this amount of debris is less than helpful.  - Treat infection.   Hypoxemia:  - Titrate O2 for sat of 88-92%.  - Will need ambulatory desaturation study prior to discharge.  HCAP:  - On ceftaz, recommend adding vanc until cultures are negative but will defer to primary.  - Recommend ID consult.  ?Pleural mass:  - VATS.  PCCM will sign off, please call back if needed.  Patient seen and examined, agree with above note.  I dictated the care and orders written for this patient under my direction.  Rush Farmer, MD [phone]

## 2015-03-12 NOTE — Progress Notes (Signed)
Subjective: [name] has had no episodes of recurrent bleeding since 12/8. She tolerated the thoracentesis well yesterday. Has remained afebrile, and otherwise hemodynamically stable. She still has a dry cough with chest congestion x 4 days, with clear rhinorrhea, but is improved. She denies fevers, chills, malaise, myalgias, chest pain, shortness of breath, palpitations, abdominal pain, urinary or bowel symptoms, focal weakness, or increased leg swelling. She did lose IV access overnight again so labs were unable to be drawn this morning.  Objective: Vital signs in last 24 hours: Filed Vitals:   03/12/15 1000 03/12/15 1030 03/12/15 1100 03/12/15 1126  BP: 132/63 91/53 91/56  104/56  Pulse: 126 118 124 121  Temp:      TempSrc:      Resp:      Height:      Weight:      SpO2:       Weight change:   Intake/Output Summary (Last 24 hours) at 03/12/15 1149 Last data filed at 03/12/15 0818  Gross per 24 hour  Intake    480 ml  Output      0 ml  Net    480 ml   General Apperance: NAD HEENT: Normocephalic, atraumatic, PERRL, EOMI, anicteric sclera Neck: Supple, trachea midline Lungs: Normal effort, rhonchi bilaterally. No wheezes or other breath sounds auscultated. Heart: Regular rate and rhythm Abdomen: Soft, nontender, nondistended, no rebound/guarding Extremities: Warm and well perfused, 2+ edema of the left thigh around graft site, 1+ distal LLE edema. Bandages in L femoral are c/d/i. Scattered ecchymoses in the R antecubital fossa. Bandages over R hand at previous sticks. Pulses: 1+ throughout Skin: No rashes or lesions Neurologic: Alert and oriented x 3. No gross deficits. Psych: Improved affect, still flat but less so than previous exams.   Lab Results: Basic Metabolic Panel:  Recent Labs Lab 03/12/15 0655 03/12/15 0845  NA 132* 130*  K 2.9* 2.9*  CL 97* 97*  CO2 26 26  GLUCOSE 69 70  BUN 29* 29*  CREATININE 4.45* 4.55*  CALCIUM 8.8* 8.5*  PHOS 3.2 3.1   Liver  Function Tests:  Recent Labs Lab 03/11/15 2016 03/12/15 0655 03/12/15 0845  PROT 6.9  --   --   ALBUMIN  --  1.8* 1.6*   CBC:  Recent Labs Lab 03/06/15 0906 03/07/15 0811  03/12/15 0655 03/12/15 0845  WBC 18.3* 15.4*  < > 8.5 8.2  NEUTROABS 15.6* 13.8*  --   --   --   HGB 9.5* 9.0*  < > 8.1* 7.1*  HCT 28.4* 27.6*  < > 27.4* 23.9*  MCV 89.0 89.9  < > 97.2 97.2  PLT 17* 5*  < > 140* 150  < > = values in this interval not displayed. Coagulation:  Recent Labs Lab 03/09/15 0428 03/10/15 0544 03/11/15 0913 03/12/15 0655  LABPROT 31.6* 29.0* 24.2* 16.3*  INR 3.12* 2.79* 2.19* 1.30    Micro Results: Recent Results (from the past 240 hour(s))  MRSA PCR Screening     Status: None   Collection Time: 03/04/15  8:02 PM  Result Value Ref Range Status   MRSA by PCR NEGATIVE NEGATIVE Final    Comment:        The GeneXpert MRSA Assay (FDA approved for NASAL specimens only), is one component of a comprehensive MRSA colonization surveillance program. It is not intended to diagnose MRSA infection nor to guide or monitor treatment for MRSA infections.   MRSA PCR Screening     Status: None  Collection Time: 03/09/15  7:48 PM  Result Value Ref Range Status   MRSA by PCR NEGATIVE NEGATIVE Final    Comment:        The GeneXpert MRSA Assay (FDA approved for NASAL specimens only), is one component of a comprehensive MRSA colonization surveillance program. It is not intended to diagnose MRSA infection nor to guide or monitor treatment for MRSA infections.   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   Acute on chronic anemia and thrombocytopenia: Most likely 2/2 lupus flare as pt has history of similar episodes but Unasyn is also a potential precipitant. Platelet nadir of <5. 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 LDH only mildly elevated with inappropriately low retic, likely not hemolyzing currently and may be more of a hypoproliferative process. C3 is decreased, C4 normal. dsDNA 10. Likely 2/2 lupus flare. Did not respond well to steroid treatment with moderate bleeding. Improving with IVIG and romiplastim -Day 5/5 of IVIG with romiplastim support for steroid-refractory SLE-induced thrombocytopenia, per hematology recs -s/p dexamethasone 40mg  PO x 4 days (ending 12/9) -Transfused 2U PRBCs and 1U plts on 12/6. 1U plts on 12/7. 2U on 12/8. Transfuse PRN -Consulted case with Dr. Norva Riffle in Rheumatology and Dr. Jana Hakim in Hematology - concordance with current plan -F/u CBCs -Hold coumadin - was given vitamin K on 12/10 for supratherapeutic INR. Continue INR checks and vitamin K PRN, especially given her continued thrombocytopenia.  Hospital-acquired pneumonia - nonproductive cough with clear rhinorrhea and sore throat. CXR showed no consolidation with likely fluid overload pattern possible explaining the cough, much worse from previous imaging. Afebrile, rhonchi on exam. Discussed with Dr. Jonnie Finner on 12/10. Procalcitonin on 12/10 was 6.14, very high and suggestive of a bacterial infection despite more viral s/s. Note that despite her ESRD and lupus, procalcitonin is still well-studied to differentiate SLE flares/viral illness vs bacterial illness with a suggested elevated cutoff of 0.75 (vs 0.5 in non SLE/ESRD). MRSA PCR negative. CT showed bilateral pleural effusions, pleural nodular thickening and adenopathy vs nodule in the median L lung. Symptoms are improving  significant and is less congested on exam. L-sided diagnostic US-guided thoracentesis on 12/12 showed exudative effusion, bloody/turbid, with 1400 WBC, 57% PMNs, low glucose of 50. These findings are possibly explained by SLE alone but in the setting of HAP treatment with diffuse parenchymal involvement, we are concerned about possibility of parapneumonic effusion. -Consulted pulmonology for further assistance with pleural fluid interpretation and possible need for bilateral chest tubes vs antibiotics and HD alone -Continue ceftazidime, day 4/7 for HAP. Also got vanc x 1 on day 1 but d/c with MRSA PCR negative. -Continue HD -Hycodin PRN  Swallowing difficulties - difficult to exam oropharynx currently but history suggestive of candidiasis vs functional swallowing difficulties. No other reflux symptoms or esophagitis symptoms, issue is difficulty initiating swallow x 2 weeks. -Treat empirically with nystatin suspension -If continued no improvement, will need SLP evaluation  Acute psychosis, Depression:  She had acute confusion and hallucinations secondary UTI. Her symptoms improved but worsened 12/9. 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 Low suspicion for Lupus cerebritis. She remains afebrile. Repeat blood cultures on 11/30 NGTD. These symptoms continue to resolve with olanzapine. -D/c'ed Unasyn on 12/5 2/2 thrombocytopenia - received 10 days of abx (initially to complete 14 day course from 11/25 (end date: 12/9)) -Continue olanzapine 5mg  BID -Monitor QTc -Continue duloxetine 30mg  QD. -Consulted psychiatry, appreciate recs  ESRD on HD, s/p Left thigh AVG:AVG placed 01/04/2015. Back on her outpatient TTS schedule. She has some left leg edema likely 2/2 AVG. Venous duplex with post surgical changes and no DVT. -Midodrine 10mg  BID -Nephrology following, appreciate recs -HD per nephrology -Percocet 5/325mg  q 6 hours prn pain -Decreased coreg for soft pressures -CXR on 12/9 shows  fluid congestion - HD per nephrology  Hx recent R. MCA Embolic CVA -Daily PT/INR -Coumadin per pharm -Continue PT. CIR recs SNF but pt declines currently and wants home health  SLE: Continue Plaquenil  Hx SVT/Tachycardia: Decreased toprol XL to 12.5mg  2/2 pressures  FEN: Renal VTE ppx: Coumadin  FULL CODE  Dispo: Will likely go to SNF or [facility] upon discharge.  Anticipated discharge in approximately 2-3 day(s).   The patient does have a current PCP Lin Landsman, MD) and does not need an [facility] hospital follow-up appointment after discharge.  The patient does not have transportation limitations that hinder transportation to clinic appointments.   LOS: 21 days   Norval Gable, MD 03/12/2015, 11:49 AM

## 2015-03-12 NOTE — Progress Notes (Signed)
Hemodialysis= Unable to UF d/t low bp. Post HD bp back to baseline after rinseback. Pt has no complaints. Dressings to AVG clean dry and intact and reinforced with surgifoam.

## 2015-03-13 DIAGNOSIS — J90 Pleural effusion, not elsewhere classified: Secondary | ICD-10-CM

## 2015-03-13 LAB — PREPARE RBC (CROSSMATCH)

## 2015-03-13 LAB — BASIC METABOLIC PANEL
ANION GAP: 5 (ref 5–15)
BUN: 13 mg/dL (ref 6–20)
CALCIUM: 8.2 mg/dL — AB (ref 8.9–10.3)
CO2: 28 mmol/L (ref 22–32)
Chloride: 103 mmol/L (ref 101–111)
Creatinine, Ser: 2.67 mg/dL — ABNORMAL HIGH (ref 0.44–1.00)
GFR, EST AFRICAN AMERICAN: 25 mL/min — AB (ref >60)
GFR, EST NON AFRICAN AMERICAN: 21 mL/min — AB (ref >60)
Glucose, Bld: 75 mg/dL (ref 65–99)
POTASSIUM: 3.3 mmol/L — AB (ref 3.5–5.1)
Sodium: 136 mmol/L (ref 135–145)

## 2015-03-13 LAB — CBC
HCT: 21.8 % — ABNORMAL LOW (ref 36.0–46.0)
Hemoglobin: 6.5 g/dL — CL (ref 12.0–15.0)
MCH: 29.7 pg (ref 26.0–34.0)
MCHC: 29.8 g/dL — ABNORMAL LOW (ref 30.0–36.0)
MCV: 99.5 fL (ref 78.0–100.0)
PLATELETS: 152 10*3/uL (ref 150–400)
RBC: 2.19 MIL/uL — AB (ref 3.87–5.11)
RDW: 19.4 % — AB (ref 11.5–15.5)
WBC: 6.3 10*3/uL (ref 4.0–10.5)

## 2015-03-13 LAB — PROTIME-INR
INR: 1.22 (ref 0.00–1.49)
PROTHROMBIN TIME: 15.6 s — AB (ref 11.6–15.2)

## 2015-03-13 LAB — SEROTONIN RELEASE ASSAY (SRA)
SRA 100IU/mL UFH Ser-aCnc: 1 % (ref 0–20)
SRA, LOW DOSE HEPARIN: 3 % (ref 0–20)

## 2015-03-13 LAB — PROCALCITONIN: PROCALCITONIN: 4.99 ng/mL

## 2015-03-13 MED ORDER — WARFARIN SODIUM 5 MG PO TABS
5.0000 mg | ORAL_TABLET | Freq: Once | ORAL | Status: AC
  Administered 2015-03-13: 5 mg via ORAL
  Filled 2015-03-13: qty 1

## 2015-03-13 MED ORDER — POTASSIUM CHLORIDE 20 MEQ/15ML (10%) PO SOLN
30.0000 meq | Freq: Two times a day (BID) | ORAL | Status: DC
  Administered 2015-03-13 – 2015-03-14 (×2): 30 meq via ORAL
  Filled 2015-03-13 (×2): qty 30

## 2015-03-13 MED ORDER — PRO-STAT SUGAR FREE PO LIQD
30.0000 mL | Freq: Every day | ORAL | Status: DC
  Filled 2015-03-13 (×2): qty 30

## 2015-03-13 MED ORDER — SODIUM CHLORIDE 0.9 % IV SOLN: Freq: Once | INTRAVENOUS | Status: DC

## 2015-03-13 MED ORDER — POTASSIUM CHLORIDE CRYS ER 20 MEQ PO TBCR
30.0000 meq | EXTENDED_RELEASE_TABLET | Freq: Two times a day (BID) | ORAL | Status: DC
  Filled 2015-03-13: qty 1

## 2015-03-13 MED ORDER — BOOST / RESOURCE BREEZE PO LIQD: 1.0000 | Freq: Two times a day (BID) | ORAL | Status: DC

## 2015-03-13 NOTE — Progress Notes (Signed)
Physical Therapy Treatment Patient Details Name: [name] MRN: [id number] DOB: 12-22-1975 Today's Date: 03/13/2015    History of Present Illness Pt is a 39 y.o. F from [place] who has had several recent hospitalizations.  She had a Rt MCA infarction and developed pseudomonal bacteremia from Rt femoral HD line which was treated.  Was doing well in CIR until she acutely developed severe confusion and hallucinations following HD sessions, likely a result of sepsis from Lt ant thigh abscess. Pt's additional PMH includes lupus, fibromyalgia, anemia.    PT Comments    Very weak today, unable to stand but did show great effort and eager to participate with therapeutic exercises and tolerated sitting EOB x 10 minutes showing some balance difficulty as she fatigued. Noted Hgb low (6.5) likely contributing to weakness today. Patient will continue to benefit from skilled physical therapy services to further improve independence with functional mobility.   Follow Up Recommendations  Other (comment);SNF (Pt refusing SNF and not eligible for HHPT)     Equipment Recommendations  Other (comment) (Has received w/c and cushion)    Recommendations for Other Services       Precautions / Restrictions Precautions Precautions: Fall Restrictions Weight Bearing Restrictions: No    Mobility  Bed Mobility Overal bed mobility: Needs Assistance Bed Mobility: Rolling;Sidelying to Sit;Sit to Sidelying Rolling: Min assist Sidelying to sit: Mod assist     Sit to sidelying: Min assist General bed mobility comments: Min assist for boost with rolling to left and right, education for pressure relief. Mod assist for truncal support to rise to EOB with use of rail as able. Min assist for LE support back into bed.   Transfers Overall transfer level: Needs assistance Equipment used: Rolling walker (2 wheeled) Transfers: Sit to/from Stand Sit to Stand: Total assist         General transfer comment:  Several attempt to stand from bed. Unable to clear buttocks today, with significant weakness. Focused on LE placement, anterior weight-shift, and hand placement for successful transition but able to safely perform today secondary to weakness.  Ambulation/Gait                 Stairs            Wheelchair Mobility    Modified Rankin (Stroke Patients Only)       Balance                                    Cognition Arousal/Alertness: Awake/alert Behavior During Therapy: WFL for tasks assessed/performed Overall Cognitive Status: Within Functional Limits for tasks assessed                 General Comments: States she needs to get used use sitting in recliner to tolerate dialysis.    Exercises General Exercises - Lower Extremity Ankle Circles/Pumps: AROM;Both;Supine;10 reps Short Arc Quad: Strengthening;Both;10 reps;Supine Long Arc Quad: Strengthening;Both;10 reps;Seated;AAROM Heel Slides: AAROM;Strengthening;Both;10 reps;Supine Hip ABduction/ADduction: AAROM;Strengthening;Both;Supine;20 reps;Seated (10 seated, 10 supine) Hip Flexion/Marching: Strengthening;Both;10 reps;Seated Other Exercises Other Exercises: Bridges for strength x10     General Comments        Pertinent Vitals/Pain Pain Assessment: Faces Faces Pain Scale: Hurts little more Pain Location: Lt knee Pain Descriptors / Indicators: Aching Pain Intervention(s): Monitored during session;Repositioned    Home Living                      Prior  Function            PT Goals (current goals can now be found in the care plan section) Acute Rehab PT Goals Patient Stated Goal: Be able to walk PT Goal Formulation: With patient/family Time For Goal Achievement: 16-Mar-2015 Potential to Achieve Goals: Fair Progress towards PT goals: Goals downgraded-see care plan    Frequency  Min 3X/week    PT Plan Current plan remains appropriate    Co-evaluation             End  of Session Equipment Utilized During Treatment: Gait belt Activity Tolerance: Patient limited by fatigue Patient left: with call bell/phone within reach;in bed;with bed alarm set     Time: [id number] PT Time Calculation (min) (ACUTE ONLY): 27 min  Charges:  $Therapeutic Exercise: 8-22 mins $Therapeutic Activity: 8-22 mins                    G Codes:      Ellouise Newer 03-16-15, 4:08 PM Camille Bal [place], [place]

## 2015-03-13 NOTE — Progress Notes (Signed)
Received Critical Hemoglobin 6.5. Dr Evette Doffing notified. No new orders at this time. Will continue to monitor pt.

## 2015-03-13 NOTE — Progress Notes (Signed)
Subjective: [name] has had no episodes of recurrent bleeding since 12/8. Has remained afebrile, and otherwise hemodynamically stable. She denies cough, shortness of breath, rhinorrhea, sore throat at this time. She denies fevers, chills, malaise, myalgias, chest pain, palpitations, abdominal pain, urinary or bowel symptoms, focal weakness, or increased leg swelling.   Objective: Vital signs in last 24 hours: Filed Vitals:   03/12/15 1745 03/12/15 2053 03/13/15 0314 03/13/15 0605  BP: 106/66 112/71  107/61  Pulse: 110 116  110  Temp: 98.5 F (36.9 C) 98.7 F (37.1 C)  98 F (36.7 C)  TempSrc: Oral Oral  Oral  Resp: 18 18  20   Height:      Weight:   179 lb 14.3 oz (81.6 kg)   SpO2: 100% 98%  100%   Weight change: -2 lb 13.9 oz (-1.3 kg)  Intake/Output Summary (Last 24 hours) at 03/13/15 1126 Last data filed at 03/13/15 0600  Gross per 24 hour  Intake    160 ml  Output      0 ml  Net    160 ml   General Apperance: NAD HEENT: Normocephalic, atraumatic, PERRL, EOMI, anicteric sclera Neck: Supple, trachea midline Lungs: Normal effort, slightly decreased breath sounds bilaterally. No wheezes, rales, or rhonchi auscultated. Heart: Regular rate and rhythm Abdomen: Soft, nontender, nondistended, no rebound/guarding Extremities: Warm and well perfused, 2+ edema of the left thigh around graft site, 1+ distal LLE edema. Bandages in L femoral are c/d/i. Scattered ecchymoses in the R antecubital fossa. Bandages over R hand at previous sticks. Pulses: 1+ throughout Skin: No rashes or lesions Neurologic: Alert and oriented x 3. No gross deficits. Psych: Improved affect, still flat but less so than previous exams.   Lab Results: Basic Metabolic Panel:  Recent Labs Lab 03/12/15 0655 03/12/15 0845 03/13/15 0840  NA 132* 130* 136  K 2.9* 2.9* 3.3*  CL 97* 97* 103  CO2 26 26 28   GLUCOSE 69 70 75  BUN 29* 29* 13  CREATININE 4.45* 4.55* 2.67*  CALCIUM 8.8* 8.5* 8.2*  PHOS 3.2  3.1  --    Liver Function Tests:  Recent Labs Lab 03/11/15 2016 03/12/15 0655 03/12/15 0845  PROT 6.9  --   --   ALBUMIN  --  1.8* 1.6*   CBC:  Recent Labs Lab 03/07/15 0811  03/12/15 0845 03/13/15 0840  WBC 15.4*  < > 8.2 6.3  NEUTROABS 13.8*  --   --   --   HGB 9.0*  < > 7.1* 6.5*  HCT 27.6*  < > 23.9* 21.8*  MCV 89.9  < > 97.2 99.5  PLT 5*  < > 150 152  < > = values in this interval not displayed. Coagulation:  Recent Labs Lab 03/10/15 0544 03/11/15 0913 03/12/15 0655 03/13/15 0603  LABPROT 29.0* 24.2* 16.3* 15.6*  INR 2.79* 2.19* 1.30 1.22    Micro Results: Recent Results (from the past 240 hour(s))  MRSA PCR Screening     Status: None   Collection Time: 03/04/15  8:02 PM  Result Value Ref Range Status   MRSA by PCR NEGATIVE NEGATIVE Final    Comment:        The GeneXpert MRSA Assay (FDA approved for NASAL specimens only), is one component of a comprehensive MRSA colonization surveillance program. It is not intended to diagnose MRSA infection nor to guide or monitor treatment for MRSA infections.   MRSA PCR Screening     Status: None   Collection Time:  03/09/15  7:48 PM  Result Value Ref Range Status   MRSA by PCR NEGATIVE NEGATIVE Final    Comment:        The GeneXpert MRSA Assay (FDA approved for NASAL specimens only), is one component of a comprehensive MRSA colonization surveillance program. It is not intended to diagnose MRSA infection nor to guide or monitor treatment for MRSA infections.   Culture, body fluid-bottle     Status: None (Preliminary result)   Collection Time: 03/11/15  2:40 PM  Result Value Ref Range Status   Specimen Description FLUID LEFT PLEURAL  Final   Special Requests NONE  Final   Culture NO GROWTH 2 DAYS  Final   Report Status PENDING  Incomplete  Gram stain     Status: None   Collection Time: 03/11/15  2:40 PM  Result Value Ref Range Status   Specimen Description FLUID LEFT PLEURAL  Final   Special  Requests NONE  Final   Gram Stain   Final    MODERATE WBC PRESENT,BOTH PMN AND MONONUCLEAR NO ORGANISMS SEEN    Report Status 03/11/2015 FINAL  Final   Assessment/Plan: 39 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   Acute on chronic anemia and thrombocytopenia: Most likely 2/2 lupus flare as pt has history of similar episodes but Unasyn is also a potential precipitant. Platelet nadir of <5. 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 LDH only mildly elevated with inappropriately low retic, likely not hemolyzing currently and may be more of a hypoproliferative process. C3 is decreased, C4 normal. dsDNA 10. Likely 2/2 lupus flare. Did not respond well to steroid treatment with moderate bleeding. Improving with IVIG and romiplastim -s/p IVIG x 5 days (ending 12/13) with romiplastim support for steroid-refractory SLE-induced thrombocytopenia (failed DMS x 4d ending 12/9), appreciate hematology recs -Transfused 2U PRBCs and 1U plts on 12/6. 1U plts on 12/7. 2U on 12/8. Transfuse PRN. Will transfuse 1U PRBCs today.  -Consulted case with Dr. Norva Riffle in Rheumatology and Dr. Jana Hakim in Hematology - concordance with current plan -F/u CBCs -Hold coumadin - was given vitamin K on 12/10 for supratherapeutic INR in the setting of requiring pleural fluid procedures  Hospital-acquired pneumonia - nonproductive cough with clear rhinorrhea and sore throat. CXR showed no consolidation with likely fluid overload pattern possible explaining the cough, much worse from previous imaging. Afebrile, rhonchi on exam. Discussed with Dr. Jonnie Finner on 12/10. Procalcitonin on 12/10 was 6.14, very high and suggestive of a bacterial infection despite more viral s/s. Note that despite her ESRD and lupus, procalcitonin is still well-studied to differentiate SLE  flares/viral illness vs bacterial illness with a suggested elevated cutoff of 0.75 (vs 0.5 in non SLE/ESRD). MRSA PCR negative. CT showed bilateral pleural effusions, pleural nodular thickening and adenopathy vs nodule in the median L lung. Symptoms are improving significant and is less congested on exam. L-sided diagnostic US-guided thoracentesis on 12/12 showed exudative effusion, bloody/turbid, with 1400 WBC, 57% PMNs, low glucose of 50. These findings are possibly explained by SLE alone but in the setting of HAP treatment with diffuse parenchymal involvement, we are concerned about possibility of parapneumonic effusion. -Consulted pulmonology and CVTS for further assistance with pleural fluid interpretation and further management - appreciate recs -Continue ceftazidime, day 5/7 for HAP. Also got vanc x 1 on day 1 but d/c with MRSA PCR negative. -  Continue HD -Hycodin PRN  Swallowing difficulties - difficult to exam oropharynx currently but history suggestive of candidiasis vs functional swallowing difficulties. No other reflux symptoms or esophagitis symptoms, issue is difficulty initiating swallow x 2 weeks. -Treat empirically with nystatin suspension -If continued no improvement, will need SLP evaluation  Acute psychosis, Depression: She had acute confusion and hallucinations secondary UTI. Her symptoms improved but worsened 12/9. 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 Low suspicion for Lupus cerebritis. She remains afebrile. Repeat blood cultures on 11/30 NGTD. These symptoms continue to resolve with olanzapine. -D/c'ed Unasyn on 12/5 2/2 thrombocytopenia - received 10 days of abx (initially to complete 14 day course from 11/25 (end date: 12/9)) -Continue olanzapine 5mg  BID -Monitor QTc -Continue duloxetine 30mg  QD. -Consulted psychiatry, appreciate recs  ESRD on HD, s/p Left thigh AVG:AVG placed 01/04/2015. Back on her outpatient TTS schedule. She has some left leg edema likely  2/2 AVG. Venous duplex with post surgical changes and no DVT. -Midodrine 10mg  BID -Nephrology following, appreciate recs -HD per nephrology -Percocet 5/325mg  q 6 hours prn pain -Decreased coreg for soft pressures -CXR on 12/9 shows fluid congestion - HD per nephrology  Hx recent R. MCA Embolic CVA -Daily PT/INR -Coumadin per pharm -Continue PT. CIR recs SNF but pt declines currently and wants home health  SLE: Continue Plaquenil  Hx SVT/Tachycardia: Decreased toprol XL to 12.5mg  2/2 pressures  FEN: Renal VTE ppx: Coumadin  FULL CODE  Dispo: Will likely go to SNF or [facility] upon discharge.  Anticipated discharge in approximately 2-3 day(s).   The patient does have a current PCP Eileen Landsman, MD) and does not need an [facility] hospital follow-up appointment after discharge.  The patient does not have transportation limitations that hinder transportation to clinic appointments.   LOS: 22 days   Norval Gable, MD 03/13/2015, 11:26 AM

## 2015-03-13 NOTE — Progress Notes (Signed)
[facility] Progress Note   Subjective: no new complaints.   Filed Vitals:   03/12/15 1745 03/12/15 2053 03/13/15 0314 03/13/15 0605  BP: 106/66 112/71  107/61  Pulse: 110 116  110  Temp: 98.5 F (36.9 C) 98.7 F (37.1 C)  98 F (36.7 C)  TempSrc: Oral Oral  Oral  Resp: 18 18  20   Height:      Weight:   81.6 kg (179 lb 14.3 oz)   SpO2: 100% 98%  100%    Inpatient medications: . sodium chloride   Intravenous Once  . B-complex with vitamin C  1 tablet Oral Daily  . cefTAZidime (FORTAZ)  IV  2 g Intravenous Q T,Th,Sa-HD  . Darbepoetin Alfa  200 mcg Intravenous Q Thu-HD  . DULoxetine  40 mg Oral Daily  . feeding supplement (NEPRO CARB STEADY)  237 mL Oral BID BM  . feeding supplement (PRO-STAT SUGAR FREE 64)  30 mL Oral BID  . hydroxychloroquine  400 mg Oral Daily  . metoprolol succinate  12.5 mg Oral q1800  . midodrine  5 mg Oral BID WC  . multivitamin  1 tablet Oral BID  . nystatin  5 mL Oral QID  . OLANZapine zydis  10 mg Oral BID PC  . pantoprazole  40 mg Oral Daily  . sodium chloride  3 mL Intravenous Q12H  . Warfarin - Pharmacist Dosing Inpatient   Does not apply q1800     sodium chloride, sodium chloride, acetaminophen, acetaminophen, alteplase, haloperidol, HYDROcodone-homatropine, Influenza vac split quadrivalent PF, lidocaine (PF), lidocaine-prilocaine, ondansetron **OR** ondansetron (ZOFRAN) IV, oxyCODONE-acetaminophen, pentafluoroprop-tetrafluoroeth, pneumococcal 23 valent vaccine, polyethylene glycol, sodium chloride  Exam: Chron ill appearing young AAF No jvd Chest L base dec'd BS,  R dec'd BS 1/2 up RRR no mrg Abd obese soft ntnd  Ext ^bruising UE's L thigh 1-2+ edema, improved No other edema L thigh AVG +bruit Neuro bilat LE weak 2/5, nonfocal, Ox 3  [facility] TTS 4 hr 87.5kg 2/2 bath L thigh AVG Hep 2600 Aranesp 200/week  Assessment: 1 Cough/ dense perihilar consolidation/ pleural effusions - on IV abx, sp L thoracentesis; per  primary 2 Thrombocytopenia- resolved , sp IVIG/ thromboplastin 3 Jeannie Done from prior coum Rx 3 ESRD L thigh graft 4 Recent cath sepsis 5 SLE plaquenil 6 AMS/ acute psychosis resolved 7 SP CVA/ marantic endocarditis/ hypercoag - off coumadin 8 Debil  9 SVT on MTP 10 Anemia max esa; Hb down 6's. Low Fe. No IV Fe w active infection, transfuse prn 11 MBD low Ca bath 12 [place] mvi/ Bcomplex 13 SP Acinetobacter UTI 14 Volume 6 kg under prior dry wt 15 Hypokalemia  Plan - HD Thursday, abx, KCl, plan transfuse prbc's w HD tomorrow   Kelly Splinter MD [facility] pager [phone]    cell [phone] 03/13/2015, 10:59 AM    Recent Labs Lab 03/11/15 0913 03/12/15 0655 03/12/15 0845 03/13/15 0840  NA 131* 132* 130* 136  K 3.0* 2.9* 2.9* 3.3*  CL 96* 97* 97* 103  CO2 27 26 26 28   GLUCOSE 73 69 70 75  BUN 22* 29* 29* 13  CREATININE 3.58* 4.45* 4.55* 2.67*  CALCIUM 8.6* 8.8* 8.5* 8.2*  PHOS 3.1 3.2 3.1  --     Recent Labs Lab 03/11/15 0913 03/11/15 2016 03/12/15 0655 03/12/15 0845  PROT  --  6.9  --   --   ALBUMIN 1.9*  --  1.8* 1.6*    Recent Labs Lab 03/07/15 0811  03/12/15  [id number] 03/12/15 0845 03/13/15 0840  WBC 15.4*  < > 8.5 8.2 6.3  NEUTROABS 13.8*  --   --   --   --   HGB 9.0*  < > 8.1* 7.1* 6.5*  HCT 27.6*  < > 27.4* 23.9* 21.8*  MCV 89.9  < > 97.2 97.2 99.5  PLT 5*  < > 140* 150 152  < > = values in this interval not displayed.

## 2015-03-13 NOTE — Consult Note (Signed)
[address]       [place] [address]             [phone]        [name] Medical Record #[id number] Date of Birth: 27-Jan-1976  Referring: Dr. Nelda Marseille Primary Care: Kristine Garbe, MD  Chief Complaint:   Bilateral pleural effusions, nodular mass vs adenopathy RUL   History of Present Illness:     This is a 39 year old African American female with a complex past medical history of SLE, ESRD,CVA, hyperlipidemia, hypertension, chronic anemia who was admitted to [facility] from [place] with altered mental status on 02/19/2015. Patient was found to have an [place] (VRE) UTI. She recently developed a dry cough, congestion, clear rhinorrhea, and bilateral pleural effusions. A CT of the chest done on 03/10/2015 showed a nodular mass or adenopathy in right upper lobe medially, central ground-glass attenuation bilateral upper lobe suspicious for pulmonary edema or less likely alveolar infiltrates, cardiomegaly, and bilateral moderate to large pleural effusions with atelectasis. IR performed a left thoracentesis on 12/12 and obtained 300 cc of bloody fluid. Pleural fluid culture showed no growth to date. Dr. Servando Snare has been consulted for further evaluation of pleural effusions.   Current Activity/ Functional Status: Patient is not independent with mobility/ambulation, transfers, ADL's, IADL's.   Zubrod Score: At the time of surgery this patient's most appropriate activity status/level should be described as: []     0    Normal activity, no symptoms []     1    Restricted in physical strenuous activity but ambulatory, able to do out light work []     2    Ambulatory and capable of self care, unable to do work activities, up and about more than 50%  Of the time                            [x]     3    Only limited self care, in bed greater than 50% of waking hours []     4    Completely disabled, no self care, confined  to bed or chair []     5    Moribund  Past Medical History  Diagnosis Date  . Hypertension   . Cardiac arrhythmia   . SVT (supraventricular tachycardia) ([place])     "today, last week, 2 wk ago; maybe 1 month ago, etc; started w/in last 3-4 yrs"(07/20/2012)  . Fainting     "~ 1 month ago; probably related to SVT" (07/20/2012)  . Shortness of breath     "related to SVT episodes" (07/20/2012)  . Chest pain at rest     "related to SVT" (07/20/2012)  . Lupus (systemic lupus erythematosus) ([place])   . GERD (gastroesophageal reflux disease)   . Fibromyalgia   . Irritable bowel syndrome (IBS)   . Hypercholesterolemia   . Heart murmur     "small" (12/25/2014)  . TIA (transient ischemic attack) 12/21/2014  . Sleep apnea     "don't wear mask anymore" (12/25/2014)  . Anemia   . History of blood transfusion "several"    "related to low counts"  . Daily headache   . Arthritis     "hips" (12/25/2014)  . Anxiety     "sometimes; don't take anything for it" (12/25/2014)  . 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  Repeat biopsy early 2016 membranous w/o activity so meds weaned off. Big flare Aug '16 creat 6, 3rd biopsy DPGN, meds resumed. Ended up starting HD Sept 2016  . Stroke ([place])   . History of vascular access device     2016: Sept 9  R IJ cath per IR.  Sept 20 not candidate for fistula due to disease veins, had LUA hybrid graft placed. Sept 21 - steal syndrome, LUA AVG ligated. Oct 7 - new left femoral Gore-tex loop graft per VVS. Oct 29 - removal R IJ tunneled HD cath    Past Surgical History  Procedure Laterality Date  . Cesarean section  2001; 2010  . Cervical biopsy  2013  . Supraventricular tachycardia ablation  07/21/12     Dr. Cristopher Peru   . Peripheral vascular catheterization N/A 12/14/2014    Procedure: Upper Extremity Venography;  Surgeon: Elam Dutch, MD;  Location: [place] [facility];  Service: Cardiovascular;  Laterality: N/A;  . Insertion hybrid anteriovenous gortex graft Left 12/18/2014    Procedure: INSERTION GORE HYBRID ARTERIOVENOUS GRAFT LEFT AXILLO-BRACHIAL.;  Surgeon: Serafina Mitchell, MD;  Location: [facility] OR;  Service: Vascular;  Laterality: Left;  . Ligation of arteriovenous  fistula Left 12/19/2014    Procedure: LIGATION OF FISTULA;  Surgeon: Elam Dutch, MD;  Location: [facility];  Service: Vascular;  Laterality: Left;  . Appendectomy  2011  . Tubal ligation  2010  . Tee without cardioversion N/A 12/25/2014    Procedure: TRANSESOPHAGEAL ECHOCARDIOGRAM (TEE);  Surgeon: Sueanne Margarita, MD;  Location: [facility];  Service: Cardiovascular;  Laterality: N/A;  . Av fistula placement Left 01/04/2015    Procedure: INSERTION OF ARTERIOVENOUS (AV) GORE-TEX GRAFT THIGH;  Surgeon: Rosetta Posner, MD;  Location: [facility];  Service: Vascular;  Laterality: Left;  . Tee without cardioversion N/A 01/30/2015    Procedure: TRANSESOPHAGEAL ECHOCARDIOGRAM (TEE);  Surgeon: Lelon Perla, MD;  Location: [facility] ENDOSCOPY;  Service: Cardiovascular;  Laterality: N/A;    Social History   Social History  . Marital Status: Single    Spouse Name: N/A  . Number of Children: N/A  . Years of Education: N/A    Social History Main Topics  . Smoking status: Never Smoker   . Smokeless tobacco: Never Used  . Alcohol Use: No     Comment: 07/20/2012 "might have a beer once/yr"  . Drug Use: No  . Sexual Activity: Not Currently    Birth Control/ Protection: None    Allergies  Allergen Reactions  . Food Swelling    Red peppers    Current Facility-Administered Medications  Medication Dose Route Frequency Provider Last Rate Last Dose  . 0.9 %  sodium chloride infusion  100 mL Intravenous PRN Roney Jaffe, MD      . 0.9 %  sodium chloride infusion  100 mL Intravenous PRN Roney Jaffe, MD      . acetaminophen (TYLENOL) suppository 650 mg  650 mg Rectal Q4H PRN Dellia Nims, MD   650 mg at  02/22/15 1858  . acetaminophen (TYLENOL) tablet 650 mg  650 mg Oral Q4H PRN Loleta Chance, MD   650 mg at 02/22/15 0901  . alteplase (CATHFLO ACTIVASE) injection 2 mg  2 mg Intracatheter Once PRN Roney Jaffe, MD      . B-complex with vitamin C tablet 1 tablet  1 tablet Oral Daily Roney Jaffe, MD   1 tablet at 03/13/15 0930  .  cefTAZidime (FORTAZ) 2 g in dextrose 5 % 50 mL IVPB  2 g Intravenous Q T,Th,Sa-HD Myrene Galas, RPH   2 g at 03/12/15 1340  . Darbepoetin Alfa (ARANESP) injection 200 mcg  200 mcg Intravenous Q Thu-HD Ernest Haber, PA-C   200 mcg at 03/07/15 1105  . DULoxetine (CYMBALTA) DR capsule 40 mg  40 mg Oral Daily Ambrose Finland, MD   40 mg at 03/13/15 0930  . feeding supplement (NEPRO CARB STEADY) liquid 237 mL  237 mL Oral BID BM Valentina Gu, NP   237 mL at 03/10/15 1045  . feeding supplement (PRO-STAT SUGAR FREE 64) liquid 30 mL  30 mL Oral BID Dale Rafael Capo, RD   30 mL at 03/11/15 2212  . haloperidol (HALDOL) tablet 0.5 mg  0.5 mg Oral Q8H PRN Norval Gable, MD      . HYDROcodone-homatropine [facility]) 5-1.5 MG/5ML syrup 5 mL  5 mL Oral Q6H PRN Norval Gable, MD   5 mL at 03/12/15 1358  . hydroxychloroquine (PLAQUENIL) tablet 400 mg  400 mg Oral Daily Collier Salina, MD   400 mg at 03/13/15 0929  . Influenza vac split quadrivalent PF (FLUARIX) injection 0.5 mL  0.5 mL Intramuscular Prior to discharge Axel Filler, MD      . lidocaine (PF) (XYLOCAINE) 1 % injection 5 mL  5 mL Intradermal PRN Roney Jaffe, MD      . lidocaine-prilocaine (EMLA) cream 1 application  1 application Topical PRN Roney Jaffe, MD      . metoprolol succinate (TOPROL-XL) 24 hr tablet 12.5 mg  12.5 mg Oral q1800 Milagros Loll, MD   12.5 mg at 03/12/15 1714  . midodrine (PROAMATINE) tablet 5 mg  5 mg Oral BID WC Roney Jaffe, MD   5 mg at 03/13/15 0930  . multivitamin (RENA-VIT) tablet 1 tablet  1 tablet Oral BID Roney Jaffe, MD   1 tablet at 03/13/15  [phone]  . nystatin (MYCOSTATIN) 100000 UNIT/ML suspension 500,000 Units  5 mL Oral QID Milagros Loll, MD   500,000 Units at 03/13/15 0930  . OLANZapine zydis (ZYPREXA) disintegrating tablet 10 mg  10 mg Oral BID PC Ambrose Finland, MD   10 mg at 03/13/15 0930  . ondansetron (ZOFRAN) tablet 4 mg  4 mg Oral Q6H PRN Corky Sox, MD   4 mg at 03/04/15 1208   Or  . ondansetron [facility]) injection 4 mg  4 mg Intravenous Q6H PRN Corky Sox, MD   4 mg at 03/11/15 0940  . oxyCODONE-acetaminophen (PERCOCET/ROXICET) 5-325 MG per tablet 1 tablet  1 tablet Oral Q6H PRN Milagros Loll, MD   1 tablet at 03/13/15 [phone]  . pantoprazole (PROTONIX) EC tablet 40 mg  40 mg Oral Daily Gaynell Face Hiatt, RPH   40 mg at 03/13/15 0930  . pentafluoroprop-tetrafluoroeth (GEBAUERS) aerosol 1 application  1 application Topical PRN Roney Jaffe, MD      . pneumococcal 23 valent vaccine (PNU-IMMUNE) injection 0.5 mL  0.5 mL Intramuscular Prior to discharge Axel Filler, MD      . polyethylene glycol (MIRALAX / Floria Raveling) packet 17 g  17 g Oral Daily PRN Corky Sox, MD      . sodium chloride (OCEAN) 0.65 % nasal spray 1 spray  1 spray Each Nare PRN Milagros Loll, MD      . sodium chloride 0.9 % injection 3 mL  3 mL Intravenous Q12H Corky Sox,  MD   3 mL at 03/13/15 1000  . Warfarin - Pharmacist Dosing Inpatient   Does not apply [place], [facility]   Stopped at 03/12/15 1800    Prescriptions prior to admission  Medication Sig Dispense Refill Last Dose  . atorvastatin (LIPITOR) 20 MG tablet Take 1 tablet (20 mg total) by mouth daily at 6 PM. 30 tablet 2 unknown  . Calcium Carbonate Antacid (TUMS PO) Take 1-2 tablets by mouth daily as needed (heartburn).   unknown  . cefTAZidime 2 g in dextrose 5 % 50 mL Inject 2 g into the vein Every Tuesday,Thursday,and Saturday with dialysis.   unknown  . cyclobenzaprine (FLEXERIL) 10 MG tablet Take 1 tablet (10 mg total) by mouth at bedtime as needed for  muscle spasms. 30 tablet 0 unknown  . Darbepoetin Alfa (ARANESP) 200 MCG/0.4ML SOSY injection Inject 0.4 mLs (200 mcg total) into the vein every Thursday with hemodialysis. 1.68 mL  unknown  . hydroxychloroquine (PLAQUENIL) 200 MG tablet Take 2 tablets (400 mg total) by mouth daily. 60 tablet 1 unknown  . metoprolol succinate (TOPROL-XL) 25 MG 24 hr tablet Take 1 tablet (25 mg total) by mouth at bedtime. 30 tablet 1 unknown  . midodrine (PROAMATINE) 10 MG tablet Take 1 tablet (10 mg total) by mouth 2 (two) times daily with a meal. 60 tablet 0 unknown  . multivitamin (RENA-VIT) TABS tablet Take 1 tablet by mouth at bedtime. 30 tablet 0 unknown  . oxyCODONE-acetaminophen (PERCOCET) 5-325 MG tablet Take 1 tablet by mouth every 6 (six) hours as needed for severe pain. 90 tablet 0 unknown  . pantoprazole (PROTONIX) 20 MG tablet Take 1 tablet (20 mg total) by mouth daily. 30 tablet 1 unknown  . polyethylene glycol (MIRALAX / GLYCOLAX) packet Take 17 g by mouth daily as needed for mild constipation. 14 each 0 unknown  . trimethobenzamide (TIGAN) 300 MG capsule Take 1 capsule (300 mg total) by mouth every 8 (eight) hours as needed for nausea/vomiting. 20 capsule 0 unknown    Family History  Problem Relation Age of Onset  . Cancer Mother     breast and ovarian  . BRCA 1/2 Sister    Review of Systems:     Cardiac Review of Systems: Y or N  Chest Pain [  N  ]  Resting SOB [ N  ] Exertional SOB  [Y  ] Pedal Edema [ N  ]   Palpitations [ N ] Syncope  [ N ]   Presyncope [ N ]  General Review of Systems: [Y] = yes [ N ]=no Constitional:  fatigue [ Y ]; nausea Aqua.Slicker  ]; night sweats [N  ]or chills [ N ]   GI:   vomiting[N  ];  Heartburn[Y  ];                Heme/Lymph: anemia[ Y ];  Neuro: TIA[Y  ];  stroke[Y  ];               Endocrine: Pre diabetes[ Y ];     Physical Exam: BP 107/61 mmHg  Pulse 110  Temp(Src) 98 F (36.7 C) (Oral)  Resp 20  Ht 5' 3"  (1.6 m)  Wt 179 lb 14.3 oz (81.6 kg)  BMI 31.88  kg/m2  SpO2 100%  LMP 01/21/2015   General appearance: alert, cooperative and appears older than stated age Head: Normocephalic, without obvious abnormality, atraumatic Resp: Diminished at bases bilaterally Cardio: Slightly tachcyardic, no murmur GI:  soft, non-tender; bowel sounds normal; no masses,  no organomegaly Extremities: No lower extremity edema Neurologic: Weakness on right, some speech difficulties  Diagnostic Studies & Laboratory data:     Recent Radiology Findings:   Dg Chest 1 View  03/11/2015  CLINICAL DATA:  39 year old female status post left-sided thoracentesis yielding bloody fluid. EXAM: CHEST 1 VIEW COMPARISON:  Chest x-ray 03/08/2015. FINDINGS: Moderate bilateral pleural effusions (right greater than left) with associated bibasilar opacities which may reflect areas of atelectasis and/or airspace consolidation no pneumothorax. Perihilar consolidation noted on the prior study appears improved on today's examination. No associated cephalization of the pulmonary vasculature. Heart size is upper limits of normal. Upper mediastinal contours are within normal limits. Atherosclerotic calcifications in the arch of the thoracic aorta. IMPRESSION: 1. No postprocedural pneumothorax. 2. Moderate bilateral pleural effusions (right greater than left) with atelectasis and/or consolidation throughout the lung bases bilaterally. 3. Decreasing perihilar airspace consolidation compared to prior examination. 4. Atherosclerosis. Electronically Signed   By: Vinnie Langton M.D.   On: 03/11/2015 15:27   US Thoracentesis Asp Pleural Space W/img Guide  03/11/2015  INDICATION: Symptomatic left sided pleural effusion EXAM: US THORACENTESIS ASP PLEURAL SPACE W/IMG GUIDE COMPARISON:  None. MEDICATIONS: 10 cc 1% lidocaine COMPLICATIONS: None immediate TECHNIQUE: Informed written consent was obtained from the patient after a discussion of the risks, benefits and alternatives to treatment. A timeout was  performed prior to the initiation of the procedure. Initial ultrasound scanning demonstrates a left pleural effusion. The lower chest was prepped and draped in the usual sterile fashion. 1% lidocaine was used for local anesthesia. Under direct ultrasound guidance, a 19 gauge, 7-cm, Yueh catheter was introduced. An ultrasound image was saved for documentation purposes. The thoracentesis was performed. The catheter was removed and a dressing was applied. The patient tolerated the procedure well without immediate post procedural complication. The patient was escorted to have an upright chest radiograph. FINDINGS: A total of approximately 300 cc of bloody fluid was removed. Requested samples were sent to the laboratory. IMPRESSION: Successful ultrasound-guided left sided thoracentesis yielding 300 cc of pleural fluid. Read by:  Lavonia Drafts [facility] Electronically Signed   By: Lucrezia Europe M.D.   On: 03/11/2015 14:59     I have independently reviewed the above radiologic studies.  Recent Lab Findings: Lab Results  Component Value Date   WBC 6.3 03/13/2015   HGB 6.5* 03/13/2015   HCT 21.8* 03/13/2015   PLT 152 03/13/2015   GLUCOSE 75 03/13/2015   CHOL 113 12/21/2014   TRIG 107 12/21/2014   HDL 53 12/21/2014   LDLCALC 39 12/21/2014   ALT 18 02/19/2015   AST 32 02/19/2015   NA 136 03/13/2015   K 3.3* 03/13/2015   CL 103 03/13/2015   CREATININE 2.67* 03/13/2015   BUN 13 03/13/2015   CO2 28 03/13/2015   TSH 2.224 03/04/2015   INR 1.22 03/13/2015   HGBA1C 6.1* 12/22/2014   Assessment / Plan:      1. Bilateral pleural effusions-s/p left thoracentesis. Per Dr. Servando Snare, recommends proceeding with right thoracentesis as high risk surgically for a VATS 2. HCAP-currently on Ceftazidine. 3. 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 She was treated with IV Vancomycin and Coumadin 4. Systemic lupus  erythematous-on Plaquenil 5. ESRD-HD days are Tuesday, Thursday, Saturday. Nephrology following 6. Chronic anemia-H and H decreased to 6.5 and 21.8. Per medicine 7. Thrombocytopenia resolved-has received IVIG and  Nplate  Lars Pinks PA-C  03/13/2015 9:45 AM   Patient seen, history taken, past history reviewed and xrays reviewed. Patient is very poor candidate to consider VATS, and not likely to help. Recommended follow up on culture of left thoracentesis, consider right thoracentesis. I have seen and examined [name] and agree with the above assessment  and plan.  Grace Isaac MD Beeper [phone] Office [phone] 03/13/2015 2:44 PM

## 2015-03-13 NOTE — Progress Notes (Signed)
Nutrition Follow-up  DOCUMENTATION CODES:   Obesity unspecified  INTERVENTION:   -Decrease 30 ml Prostat BID to daily -D/c Nepro BID, due to poor acceptance -Boost Breeze po BID, each supplement provides 250 kcal and 9 grams of protein -Provide nourishments between meals -If pt continues to be unable to meet needs via PO intake, consider nutrition support or appetite stimulant  NUTRITION DIAGNOSIS:   Increased nutrient needs related to chronic illness as evidenced by estimated needs.  Ongoing  GOAL:   Patient will meet greater than or equal to 90% of their needs  Unmet  MONITOR:   PO intake, Supplement acceptance, Labs, Weight trends, I & O's  REASON FOR ASSESSMENT:   Malnutrition Screening Tool    ASSESSMENT:   39 y.o. 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 Per the rehab physician and nursing staff, the patient started stating that there was someone else in the room telling her what to do. This has been going on for the past 24 hours or so and seems to be getting worse. This is also accompanied by tachycardia and low grade fever.   Rapid response was called on 03/07/15 due to uncontrolled bleeding from lt thigh fistula.   Pt currently receiving IVIG treatments for SLE flare. Pt s/p thoracentesis on 03/11/15 due to pleural effusion. Noted 500 ml output; cultures pending. CTS consulted to VATS.   Meal completion is declining; PO: 0-25%. She is refusing Nepro and Prostat supplements. RD will d/c due to poor acceptance.   Labs reviewed: K: 3.3.   Diet Order:  Diet renal with fluid restriction Fluid restriction:: 1200 mL Fluid; Room service appropriate?: Yes; Fluid consistency:: Thin  Skin:  Wound (see comment) (MASD sacrum)  Last BM:  03/08/15  Height:   Ht Readings from Last 1 Encounters:  02/22/15 5\' 3"  (1.6 m)    Weight:   Wt Readings  from Last 1 Encounters:  03/13/15 179 lb 14.3 oz (81.6 kg)    Ideal Body Weight:  52.27 kg  BMI:  Body mass index is 31.88 kg/(m^2).  Estimated Nutritional Needs:   Kcal:  [id number]  Protein:  105-120 grams  Fluid:  Per MD  EDUCATION NEEDS:   No education needs identified at this time  Nilton Lave A. Jimmye Norman, RD, LDN, CDE Pager: [phone] After hours Pager: [phone]

## 2015-03-13 NOTE — Progress Notes (Addendum)
ANTICOAGULATION and ANIBIOTIC CONSULT NOTE - Follow Up Consult  Pharmacy Consult for coumadin and CEFTAZ Indication: hypercoagulable state 2nd SLE  Allergies  Allergen Reactions  . Food Swelling    Red peppers    Patient Measurements: Height: 5\' 3"  (160 cm) Weight: 179 lb 14.3 oz (81.6 kg) IBW/kg (Calculated) : 52.4   Vital Signs: Temp: 98 F (36.7 C) (12/14 0605) Temp Source: Oral (12/14 0605) BP: 107/61 mmHg (12/14 0605) Pulse Rate: 110 (12/14 0605)  Labs:  Recent Labs  03/11/15 0913 03/12/15 0655 03/12/15 0845 03/13/15 0603 03/13/15 0840  HGB 8.2* 8.1* 7.1*  --  6.5*  HCT 26.7* 27.4* 23.9*  --  21.8*  PLT 122* 140* 150  --  152  LABPROT 24.2* 16.3*  --  15.6*  --   INR 2.19* 1.30  --  1.22  --   CREATININE 3.58* 4.45* 4.55*  --  2.67*    Estimated Creatinine Clearance: 28.6 mL/min (by C-G formula based on Cr of 2.67).  Assessment: Coumadin PTA for hypercoagulable state, hx of SLE, and new CVA in October. 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 Cards and Hematology had seen outpatient and agreed on started [facility]. Coumadin on hold except Warf x 1 dose was given on 12/6.  INR 2.28>3.12>2.79>2.19> vit K 10 po>1.3>1.22 plts 5>>11>>23>>21>>122>140>151>152; Hg  8.1>7.1>6.5 2 U PRBCs ordered may be given Thurs w/ HD chest CT w/ large B effusions and thoracentesis done 12/12 TCTS consulted: recs R thoracentesis as high risk for VATS 12/12 vit K 10 po given at 1319 12/9:  NPLATE - given 075-GRM.  Heme signed off 12/14, no more treatment of platelets at this time 12/9 HIT ab +,  12/12 HIT SRA neg 12/13 d/w MD no coum today. Possible chest tube 12/14 coumadin remains on hold for possible repeat thoracentesis Infectious Disease: ceftaz #5/7 for PNA.  S/p abx for UTI with VRE and Acinetobacter. Concern for L thigh AVG infection but hesitant to remove as this is last access site. VVS states does not appear infected. Recently completed treatment for Pseudomonas bacteremia in  early November. Afeb, WBC down to 6.3  (decadron given 4 days 12/6 - 12/9) -12/10: Pharmacy consulted to restart vanc/ceftaz for pneumonia; vanc discontinued 12/11 CT chest: mod to large B pleural effusion; atelectasis B LL; nodular mass or adenopathy RUL , ground glass  B UO susp for pulm edema; or less likely alveolar infiltrates 12/12 s/p thoracentesis 300 ml bloody fluid  11/24 blood x 2 - negF 11/23 blood x 2 - negF 11/23 urine: Acinetobacter calcoaceticus/baumannii complex (S- Unasyn, Imi, Gent but MIC = 2, R-ceftaz, septra) and VRE (S-Unasyn, Amp, linezolid) 11/30 blood x 2 - No growth final 12/10 MRSA PCR neg  Vanc 11/22 >> 11/25; 12/10 x 1 dose Ceftaz 11/22 >> 11/25; 12/10--> Unasyn 11/25 >> 12/5  Goal of Therapy:  INR 2-3 Monitor platelets by anticoagulation protocol: Yes   Plan:  - coumadin remains on hold for possible repeat thoracentesis - daily INR - Ceftaz 2 gm IV after HD on TTS day # 5/7  Eudelia Bunch, Pharm.D. [phone] 03/13/2015 12:50 PM Addendum: Called by MD: no more invasive procedures planned. OK to resume coumadin.   INR 1.22 ( baseline) after reversed with vitamin K 10 mg po on 12/12. Hg down to 6.5 - to get blood transfusion w/ HD tomorrow. PLCT has fully recovered after NPLATE given [id number]  Plan:  Coumadin 5 mg po x 1 dose Daily INR  Eudelia Bunch,  Pharm.D. BP:[phone] 03/13/2015 3:34 PM

## 2015-03-13 NOTE — Progress Notes (Addendum)
Pts IV leaking before blood was able to be started. IV team consulted. 1730 IV team member unsuccessful. 2nd IV team member coming to attempt IV.  1800 Second IV Team member unsuccessful with IV attempt. Made MD aware. Dr said to hold blood for now. Will transfuse in HD tomorrow.

## 2015-03-13 NOTE — Progress Notes (Signed)
Patient is day 6 s/p IVIG; also received Nplate 03/08/2015. Pletelets have responded nicely  Results for PRANISHA, CHANNEL (MRN [id number]) as of 03/13/2015 07:32  Ref. Range 03/09/2015 04:28 03/09/2015 19:19 03/11/2015 09:13 03/12/2015 06:55 03/12/2015 08:45  Platelets Latest Ref Range: 150-400 K/uL 23 (LL) 21 (LL) 122 (L) 140 (L) 150   At this point no further treatment of platelets is needed. Suspect is patient's SLE flare is controlled the platelet count will remain >50K. She may need further Nplate or other treatment at that time  Will sign off at this time. Please reconsult as needed

## 2015-03-14 LAB — RENAL FUNCTION PANEL
ALBUMIN: 1.5 g/dL — AB (ref 3.5–5.0)
ANION GAP: 6 (ref 5–15)
BUN: 18 mg/dL (ref 6–20)
CALCIUM: 8.6 mg/dL — AB (ref 8.9–10.3)
CO2: 27 mmol/L (ref 22–32)
Chloride: 104 mmol/L (ref 101–111)
Creatinine, Ser: 3.6 mg/dL — ABNORMAL HIGH (ref 0.44–1.00)
GFR calc non Af Amer: 15 mL/min — ABNORMAL LOW (ref >60)
GFR, EST AFRICAN AMERICAN: 17 mL/min — AB (ref >60)
GLUCOSE: 69 mg/dL (ref 65–99)
PHOSPHORUS: 1.4 mg/dL — AB (ref 2.5–4.6)
POTASSIUM: 4.1 mmol/L (ref 3.5–5.1)
SODIUM: 137 mmol/L (ref 135–145)

## 2015-03-14 LAB — CBC
HEMATOCRIT: 21.8 % — AB (ref 36.0–46.0)
HEMOGLOBIN: 6.5 g/dL — AB (ref 12.0–15.0)
MCH: 29.5 pg (ref 26.0–34.0)
MCHC: 29.8 g/dL — AB (ref 30.0–36.0)
MCV: 99.1 fL (ref 78.0–100.0)
Platelets: 141 10*3/uL — ABNORMAL LOW (ref 150–400)
RBC: 2.2 MIL/uL — AB (ref 3.87–5.11)
RDW: 19.4 % — ABNORMAL HIGH (ref 11.5–15.5)
WBC: 7.2 10*3/uL (ref 4.0–10.5)

## 2015-03-14 LAB — PROTIME-INR
INR: 1.24 (ref 0.00–1.49)
Prothrombin Time: 15.8 seconds — ABNORMAL HIGH (ref 11.6–15.2)

## 2015-03-14 MED ORDER — DARBEPOETIN ALFA 200 MCG/0.4ML IJ SOSY
PREFILLED_SYRINGE | INTRAMUSCULAR | Status: AC
  Filled 2015-03-14: qty 0.4

## 2015-03-14 MED ORDER — ONDANSETRON HCL 4 MG/2ML IJ SOLN
INTRAMUSCULAR | Status: AC
  Filled 2015-03-14: qty 2

## 2015-03-14 MED ORDER — WARFARIN SODIUM 5 MG PO TABS
5.0000 mg | ORAL_TABLET | Freq: Once | ORAL | Status: AC
  Administered 2015-03-14: 5 mg via ORAL
  Filled 2015-03-14: qty 1

## 2015-03-14 NOTE — Progress Notes (Signed)
[facility] Progress Note   Subjective: no new complaints.   Filed Vitals:   03/14/15 1008 03/14/15 1030 03/14/15 1045 03/14/15 1107  BP: 97/73 97/73 82/57  99/67  Pulse: 123 123 71 115  Temp:  100 F (37.8 C) 98.4 F (36.9 C) 98.9 F (37.2 C)  TempSrc:  Oral Oral Oral  Resp:  18 18 18   Height:      Weight:      SpO2:  100% 100% 100%    Inpatient medications: . sodium chloride   Intravenous Once  . sodium chloride   Intravenous Once  . B-complex with vitamin C  1 tablet Oral Daily  . cefTAZidime (FORTAZ)  IV  2 g Intravenous Q T,Th,Sa-HD  . Darbepoetin Alfa      . Darbepoetin Alfa  200 mcg Intravenous Q Thu-HD  . DULoxetine  40 mg Oral Daily  . feeding supplement  1 Container Oral BID BM  . feeding supplement (PRO-STAT SUGAR FREE 64)  30 mL Oral Daily  . hydroxychloroquine  400 mg Oral Daily  . metoprolol succinate  12.5 mg Oral q1800  . midodrine  5 mg Oral BID WC  . multivitamin  1 tablet Oral BID  . nystatin  5 mL Oral QID  . OLANZapine zydis  10 mg Oral BID PC  . ondansetron      . pantoprazole  40 mg Oral Daily  . potassium chloride  30 mEq Oral BID  . sodium chloride  3 mL Intravenous Q12H  . warfarin  5 mg Oral ONCE-1800  . Warfarin - Pharmacist Dosing Inpatient   Does not apply q1800     acetaminophen, acetaminophen, haloperidol, HYDROcodone-homatropine, Influenza vac split quadrivalent PF, ondansetron **OR** ondansetron (ZOFRAN) IV, oxyCODONE-acetaminophen, pneumococcal 23 valent vaccine, polyethylene glycol, sodium chloride  Exam: Chron ill appearing young AAF No jvd Chest dec'd BS bilat bases L > R RRR no mrg Abd obese soft ntnd  Ext ^bruising UE's L thigh 1-2+ edema, improved 1+ pretib edema L thigh AVG +bruit Neuro bilat LE weak 2/5, nonfocal, Ox 3  [facility] TTS 4 hr 87.5kg 2/2 bath L thigh AVG Hep 2600 Aranesp 200/week  Assessment: 1 Cough/ dense perihilar consolidation/ pleural effusions - on IV abx, sp L thoracentesis; per  primary 2 Thrombocytopenia- resolved , sp IVIG/ thromboplastin 3 ESRD L thigh graft 4 Recent pseudomonas cath sepsis 5 SLE plaquenil 6 AMS/ acute psychosis resolved 7 SP CVA/ marantic endocarditis/ hypercoag - off coumadin now 8 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 Low Fe. 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    Kelly Splinter MD [facility] pager [phone]    cell [phone] 03/14/2015, 11:13 AM    Recent Labs Lab 03/12/15 0655 03/12/15 0845 03/13/15 0840 03/14/15 0615  NA 132* 130* 136 137  K 2.9* 2.9* 3.3* 4.1  CL 97* 97* 103 104  CO2 26 26 28 27   GLUCOSE 69 70 75 69  BUN 29* 29* 13 18  CREATININE 4.45* 4.55* 2.67* 3.60*  CALCIUM 8.8* 8.5* 8.2* 8.6*  PHOS 3.2 3.1  --  1.4*    Recent Labs  Lab 03/11/15 2016 03/12/15 0655 03/12/15 0845 03/14/15 0615  PROT 6.9  --   --   --   ALBUMIN  --  1.8* 1.6* 1.5*    Recent Labs Lab 03/12/15 0845 03/13/15 0840 03/14/15 0615  WBC 8.2 6.3 7.2  HGB 7.1* 6.5* 6.5*  HCT 23.9* 21.8* 21.8*  MCV 97.2 99.5 99.1  PLT 150 152 141*

## 2015-03-14 NOTE — Progress Notes (Signed)
Subjective: [name] has had no episodes of recurrent bleeding since 12/8. Afebrile, HDS. Lost IV access yesterday, told nursing and IV team that she could receive blood products via HD today. She denies cough, shortness of breath, rhinorrhea, sore throat at this time .She denies fevers, chills, malaise, myalgias, chest pain, palpitations, abdominal pain, urinary or bowel symptoms, focal weakness, or increased leg swelling.   Objective: Vital signs in last 24 hours: Filed Vitals:   03/14/15 1208 03/14/15 1230 03/14/15 1238 03/14/15 1308  BP: 108/86 120/88 100/76 95/76  Pulse: 110 107 111 111  Temp:  97.9 F (36.6 C)    TempSrc:  Oral    Resp:  18    Height:      Weight:      SpO2:       Weight change: -4 lb 10.1 oz (-2.1 kg)  Intake/Output Summary (Last 24 hours) at 03/14/15 1317 Last data filed at 03/14/15 1230  Gross per 24 hour  Intake    990 ml  Output      0 ml  Net    990 ml   General Apperance: NAD HEENT: Normocephalic, atraumatic, PERRL, EOMI, anicteric sclera Neck: Supple, trachea midline Lungs: Normal effort, slightly decreased breath sounds bilaterally. No wheezes, rales, or rhonchi auscultated. Heart: Regular rate and rhythm Abdomen: Soft, nontender, nondistended, no rebound/guarding Extremities: Warm and well perfused, 2+ edema of the left thigh around graft site, 1+ distal LLE edema. Bandages in L femoral are c/d/i. Scattered ecchymoses in the R antecubital fossa. Bandages over R hand at previous sticks. Pulses: 1+ throughout Skin: No rashes or lesions Neurologic: Alert and oriented x 3. No gross deficits. Psych: Improved affect, still flat but less so than previous exams.   Lab Results: Basic Metabolic Panel:  Recent Labs Lab 03/12/15 0845 03/13/15 0840 03/14/15 0615  NA 130* 136 137  K 2.9* 3.3* 4.1  CL 97* 103 104  CO2 26 28 27   GLUCOSE 70 75 69  BUN 29* 13 18  CREATININE 4.55* 2.67* 3.60*  CALCIUM 8.5* 8.2* 8.6*  PHOS 3.1  --  1.4*    Liver Function Tests:  Recent Labs Lab 03/11/15 2016  03/12/15 0845 03/14/15 0615  PROT 6.9  --   --   --   ALBUMIN  --   < > 1.6* 1.5*  < > = values in this interval not displayed. CBC:  Recent Labs Lab 03/13/15 0840 03/14/15 0615  WBC 6.3 7.2  HGB 6.5* 6.5*  HCT 21.8* 21.8*  MCV 99.5 99.1  PLT 152 141*   Coagulation:  Recent Labs Lab 03/11/15 0913 03/12/15 0655 03/13/15 0603 03/14/15 0615  LABPROT 24.2* 16.3* 15.6* 15.8*  INR 2.19* 1.30 1.22 1.24    Micro Results: Recent Results (from the past 240 hour(s))  MRSA PCR Screening     Status: None   Collection Time: 03/04/15  8:02 PM  Result Value Ref Range Status   MRSA by PCR NEGATIVE NEGATIVE Final    Comment:        The GeneXpert MRSA Assay (FDA approved for NASAL specimens only), is one component of a comprehensive MRSA colonization surveillance program. It is not intended to diagnose MRSA infection nor to guide or monitor treatment for MRSA infections.   MRSA PCR Screening     Status: None   Collection Time: 03/09/15  7:48 PM  Result Value Ref Range Status   MRSA by PCR NEGATIVE NEGATIVE Final    Comment:  The GeneXpert MRSA Assay (FDA approved for NASAL specimens only), is one component of a comprehensive MRSA colonization surveillance program. It is not intended to diagnose MRSA infection nor to guide or monitor treatment for MRSA infections.   Culture, body fluid-bottle     Status: None (Preliminary result)   Collection Time: 03/11/15  2:40 PM  Result Value Ref Range Status   Specimen Description FLUID LEFT PLEURAL  Final   Special Requests NONE  Final   Culture NO GROWTH 2 DAYS  Final   Report Status PENDING  Incomplete  Gram stain     Status: None   Collection Time: 03/11/15  2:40 PM  Result Value Ref Range Status   Specimen Description FLUID LEFT PLEURAL  Final   Special Requests NONE  Final   Gram Stain   Final    MODERATE WBC PRESENT,BOTH PMN AND MONONUCLEAR NO  ORGANISMS SEEN    Report Status 03/11/2015 FINAL  Final   Assessment/Plan: 39 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   Acute on chronic anemia and thrombocytopenia: Most likely 2/2 lupus flare as pt has history of similar episodes but Unasyn is also a potential precipitant. Platelet nadir of <5. 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 LDH only mildly elevated with inappropriately low retic, likely not hemolyzing currently and may be more of a hypoproliferative process. C3 is decreased, C4 normal. dsDNA 10. Likely 2/2 lupus flare. Did not respond well to steroid treatment with moderate bleeding. Improving with IVIG and romiplastim -s/p IVIG x 5 days (ending 12/13) with romiplastim support for steroid-refractory SLE-induced thrombocytopenia (failed DMS x 4d ending 12/9), appreciate hematology recs -Transfused 2U PRBCs and 1U plts on 12/6. 1U plts on 12/7. 2U on 12/8. Transfuse PRN. Transfused 2U PRBCs today during HD. -Consulted case with Dr. Norva Riffle in Rheumatology and Dr. Jana Hakim in Hematology - concordance with current plan -F/u CBCs -Hold coumadin - was given vitamin K on 12/10 for supratherapeutic INR in the setting of requiring pleural fluid procedures  Hospital-acquired pneumonia - nonproductive cough with clear rhinorrhea and sore throat. CXR showed no consolidation with likely fluid overload pattern possible explaining the cough, much worse from previous imaging. Afebrile, rhonchi on exam. Discussed with Dr. Jonnie Finner on 12/10. Procalcitonin on 12/10 was 6.14, very high and suggestive of a bacterial infection despite more viral s/s. Note that despite her ESRD and lupus, procalcitonin is still well-studied to differentiate SLE flares/viral illness vs bacterial illness with a suggested elevated cutoff of 0.75 (vs 0.5 in  non SLE/ESRD). MRSA PCR negative. CT showed bilateral pleural effusions, pleural nodular thickening and adenopathy vs nodule in the median L lung. Symptoms are improving significant and is less congested on exam. L-sided diagnostic US-guided thoracentesis on 12/12 showed exudative effusion, bloody/turbid, with 1400 WBC, 57% PMNs, low glucose of 50. These findings are possibly explained by SLE alone but in the setting of HAP treatment with diffuse parenchymal involvement, we are concerned about possibility of parapneumonic effusion. -Consulted pulmonology and CVTS for further assistance with pleural fluid interpretation and further management - appreciate recs -Continue ceftazidime, day 6/7 for HAP. Also got vanc x 1 on day 1 but d/c with MRSA PCR negative. -Continue HD -Hycodin PRN  Swallowing difficulties - difficult to exam oropharynx currently but history suggestive of candidiasis vs functional swallowing difficulties. No other reflux symptoms or esophagitis symptoms,  issue is difficulty initiating swallow x 2 weeks. -Treat empirically with nystatin suspension -If continued no improvement, will need SLP evaluation  Acute psychosis, Depression: She had acute confusion and hallucinations secondary UTI. Her symptoms improved but worsened 12/9. 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 Low suspicion for Lupus cerebritis. She remains afebrile. Repeat blood cultures on 11/30 NGTD. These symptoms continue to resolve with olanzapine. -D/c'ed Unasyn on 12/5 2/2 thrombocytopenia - received 10 days of abx (initially to complete 14 day course from 11/25 (end date: 12/9)) -Continue olanzapine 5mg  BID -Monitor QTc -Continue duloxetine 30mg  QD. -Consulted psychiatry, appreciate recs  ESRD on HD, s/p Left thigh AVG:AVG placed 01/04/2015. Back on her outpatient TTS schedule. She has some left leg edema likely 2/2 AVG. Venous duplex with post surgical changes and no DVT. -Midodrine 10mg   BID -Nephrology following, appreciate recs -HD per nephrology -Percocet 5/325mg  q 6 hours prn pain -Decreased coreg for soft pressures -CXR on 12/9 shows fluid congestion - HD per nephrology  Hx recent R. MCA Embolic CVA -Daily PT/INR -Coumadin per pharm -Continue PT. CIR recs SNF but pt declines currently and wants home health. Needs to be able to sit in a dialysis chair for ~3 hours. Encouraging daily PT, nursing  SLE: Continue Plaquenil  Hx SVT/Tachycardia: Decreased toprol XL to 12.5mg  2/2 pressures  FEN: Renal VTE ppx: Coumadin  FULL CODE  Dispo: Will likely go to SNF or [facility] upon discharge.  Anticipated discharge in approximately 2-3 day(s).   The patient does have a current PCP Lin Landsman, MD) and does not need an [facility] hospital follow-up appointment after discharge.  The patient does not have transportation limitations that hinder transportation to clinic appointments.   LOS: 23 days   Norval Gable, MD 03/14/2015, 1:17 PM

## 2015-03-14 NOTE — Progress Notes (Signed)
ANTICOAGULATION CONSULT NOTE - Follow Up Consult  Pharmacy Consult for Coumadin Indication: stroke and hypercoaguable state  Patient Measurements: Height: 5\' 3"  (160 cm) Weight: 169 lb 12.1 oz (77 kg) IBW/kg (Calculated) : 52.4  Vital Signs: Temp: 99 F (37.2 C) (12/15 0428) Temp Source: Oral (12/15 0428) BP: 104/63 mmHg (12/15 0428) Pulse Rate: 116 (12/15 0428)  Labs:  Recent Labs  03/12/15 0655 03/12/15 0845 03/13/15 0603 03/13/15 0840 03/14/15 0615  HGB 8.1* 7.1*  --  6.5* 6.5*  HCT 27.4* 23.9*  --  21.8* 21.8*  PLT 140* 150  --  152 141*  LABPROT 16.3*  --  15.6*  --  15.8*  INR 1.30  --  1.22  --  1.24  CREATININE 4.45* 4.55*  --  2.67* 3.60*    Assessment: 39 yr old female with ESRD on Coumadin for history of SLE, stroke, and hypercoagulable state. She had been on Coumadin 5 mg daily from 11/16-11/22/16, with gradual rise to therapeutic INR after 5 days. Coumadin has been held since 12/6 due to thrombocytopenia (since resolved) and precautionary in the case that any procedures were needed. Received Vitamin K 10 mg PO on 12/12 and warfarin resumed on 12/14. H/H remains low but stable with plan to receive blood with IHD on 12/15 due to IV access issues.   Date 12/14 12/15        Warfarin dose 5mg  5 mg         INR 1.22 1.24             Goal of Therapy:  INR 2-3 Monitor platelets by anticoagulation protocol: Yes   Plan:  -Warfarin 5 mg PO x 1 tonight -Noted to receive blood in IHD today; monitor CBC and for any sxs of bleeding -Daily INR   Vincenza Hews, PharmD, BCPS 03/14/2015, 8:10 AM Pager: [phone]

## 2015-03-14 NOTE — Consult Note (Addendum)
WOC wound consult note Reason for Consult: Consult requested to order air cushion for chair to use when pt is OOB for dialysis. Wound type: Assessed buttocks; patchy areas of partial thickness skin loss related to moisture associated skin damage.   Pressure Ulcer POA: No pressure injury at this time; affected areas were present on admission. Measurement: Affected area to bilat buttocks, approx 5X5cm Wound bed: pink and moist Drainage (amount, consistency, odor) no odor or drainage Dressing procedure/placement/frequency: Barrier cream to protect from further injury and repel moisture,  Foam dressing is in place to reduce shear when repositioning. Ordered air cushion as requested and discussed use with the patient for pressure reduction when sitting up in the chair.  She verbalizes understanding. Please re-consult if further assistance is needed.  Thank-you,  Julien Girt MSN, [place], [place], [place], [place]

## 2015-03-15 LAB — TYPE AND SCREEN
ABO/RH(D): A POS
Antibody Screen: NEGATIVE
UNIT DIVISION: 0
Unit division: 0

## 2015-03-15 LAB — CBC
HEMATOCRIT: 33.2 % — AB (ref 36.0–46.0)
HEMOGLOBIN: 10.9 g/dL — AB (ref 12.0–15.0)
MCH: 30.9 pg (ref 26.0–34.0)
MCHC: 32.8 g/dL (ref 30.0–36.0)
MCV: 94.1 fL (ref 78.0–100.0)
Platelets: 47 10*3/uL — ABNORMAL LOW (ref 150–400)
RBC: 3.53 MIL/uL — ABNORMAL LOW (ref 3.87–5.11)
RDW: 18.4 % — AB (ref 11.5–15.5)
WBC: 7 10*3/uL (ref 4.0–10.5)

## 2015-03-15 LAB — PROTIME-INR
INR: 1.32 (ref 0.00–1.49)
Prothrombin Time: 16.5 seconds — ABNORMAL HIGH (ref 11.6–15.2)

## 2015-03-15 MED ORDER — WARFARIN SODIUM 5 MG PO TABS
5.0000 mg | ORAL_TABLET | Freq: Once | ORAL | Status: AC
  Administered 2015-03-15: 5 mg via ORAL
  Filled 2015-03-15: qty 1

## 2015-03-15 NOTE — Progress Notes (Signed)
Physical Therapy Treatment Patient Details Name: [name] MRN: [id number] DOB: Dec 16, 1975 Today's Date: 03/15/2015    History of Present Illness Pt is a 39 y.o. F from [place] who has had several recent hospitalizations.  She had a Rt MCA infarction and developed pseudomonal bacteremia from Rt femoral HD line which was treated.  Was doing well in CIR until she acutely developed severe confusion and hallucinations following HD sessions, likely a result of sepsis from Lt ant thigh abscess. Pt's additional PMH includes lupus, fibromyalgia, anemia.    PT Comments    Pt requesting nurse put her back to bed <10 minutes after getting into chair due to buttock pain. Asked MD to come speak to pt which he did. Pt already sitting on her w/c cushion. Repositioned pt in chair with +3 total assist to scoot back in chair. Used maxisky to lift pt up from chair and put additional hospital issued air cushion under pt. Nurse gave pt pain meds. Pt reports improved comfort with second cushion and to try to stay up longer.  Follow Up Recommendations  SNF (Pt now agreeable)     Equipment Recommendations  Other (comment) (Has received w/c and cushion)    Recommendations for Other Services OT consult     Precautions / Restrictions Precautions Precautions: Fall Restrictions Weight Bearing Restrictions: No    Mobility  Bed Mobility Overal bed mobility: Needs Assistance Bed Mobility: Rolling;Sidelying to Sit;Sit to Sidelying Rolling: Mod assist Sidelying to sit: Max assist     Sit to sidelying: Min assist General bed mobility comments: Assist to elevate trunk and bring hips to EOB. Assist to bring legs back into bed.  Transfers Overall transfer level: Needs assistance Equipment used: Ambulation equipment used             General transfer comment: Attempted to stand pt x 2 with Stedy but pt unable to bring hips off bed with +2 total assist. Used Maxisky to transfer to chair.    Ambulation/Gait                 Stairs            Wheelchair Mobility    Modified Rankin (Stroke Patients Only)       Balance Overall balance assessment: Needs assistance Sitting-balance support: Bilateral upper extremity supported;Feet supported Sitting balance-Leahy Scale: Poor Sitting balance - Comments: On edge of chair pt needed min A to maintain f Postural control: Posterior lean                          Cognition Arousal/Alertness: Awake/alert Behavior During Therapy: Anxious Overall Cognitive Status: Within Functional Limits for tasks assessed                 General Comments: MD's explained importance of sitting in recliner for extended time for OP HD. Pt acknowledged this but as soon as up in chair pt asking me not to leave because she didn't want to stay in chair.    Exercises General Exercises - Lower Extremity Short Arc Quad: Strengthening;Both;10 reps;Supine;AAROM Long [employer]: Strengthening;Both;10 reps;Seated;AAROM Heel Slides: AAROM;Strengthening;Both;10 reps;Supine Hip ABduction/ADduction:  (10 seated, 10 supine) Hip Flexion/Marching: Strengthening;Both;10 reps;Seated    General Comments        Pertinent Vitals/Pain Pain Assessment: 0-10 Pain Score: 9  Pain Location: buttocks Pain Descriptors / Indicators: Aching Pain Intervention(s): Limited activity within patient's tolerance;Repositioned;RN gave pain meds during session    Home Living  Prior Function            PT Goals (current goals can now be found in the care plan section) Acute Rehab PT Goals PT Goal Formulation: With patient/family Time For Goal Achievement: 03/29/15 Potential to Achieve Goals: Fair Progress towards PT goals: Not progressing toward goals - comment    Frequency  Min 3X/week    PT Plan Current plan remains appropriate    Co-evaluation             End of Session Equipment Utilized During  Treatment: Other (comment) (maxisky) Activity Tolerance: Patient limited by pain Patient left: with call bell/phone within reach;in chair     Time: 1020-1030 PT Time Calculation (min) (ACUTE ONLY): 10 min  Charges:  $Therapeutic Exercise: 8-22 mins $Therapeutic Activity: 8-22 mins                    G Codes:      Lenox Bink March 22, 2015, 11:28 AM Suanne Marker PT [phone]

## 2015-03-15 NOTE — Progress Notes (Signed)
ANTICOAGULATION CONSULT NOTE - Follow Up Consult  Pharmacy Consult for Coumadin Indication: stroke and hypercoaguable state  Patient Measurements: Height: 5\' 3"  (160 cm) Weight: 170 lb 13.7 oz (77.5 kg) IBW/kg (Calculated) : 52.4  Vital Signs: Temp: 99.3 F (37.4 C) (12/16 0540) Temp Source: Oral (12/16 0540) BP: 116/85 mmHg (12/16 0540) Pulse Rate: 111 (12/16 0540)  Labs:  Recent Labs  03/13/15 0603 03/13/15 0840 03/14/15 0615 03/15/15 0453  HGB  --  6.5* 6.5*  --   HCT  --  21.8* 21.8*  --   PLT  --  152 141*  --   LABPROT 15.6*  --  15.8* 16.5*  INR 1.22  --  1.24 1.32  CREATININE  --  2.67* 3.60*  --     Assessment: 39 yr old female with ESRD on Coumadin for history of SLE, stroke, and hypercoagulable state. She had been on Coumadin 5 mg daily from 11/16-11/22/16, with gradual rise to therapeutic INR after 5 days. Coumadin has been held since 12/6 due to thrombocytopenia (since resolved) and precautionary in the case that any procedures were needed. Received Vitamin K 10 mg PO on 12/12 and warfarin resumed on 12/14. H/H remains low but stable with plan to receive blood with IHD on 12/15 due to IV access issues.   Date 12/14 12/15 12/16        Warfarin dose 5mg  5 mg  5 mg       INR 1.22 1.24 1.32            Goal of Therapy:  INR 2-3 Monitor platelets by anticoagulation protocol: Yes   Plan:  - Warfarin 5 mg PO x 1 tonight - Received blood on 12/15 in IHD; no CBC today - Daily INR while inpatient  Vincenza Hews, PharmD, BCPS 03/15/2015, 9:31 AM Pager: [phone]

## 2015-03-15 NOTE — Consult Note (Addendum)
WOC follow-up: Consult requested for buttocks and moisture associated skin damage; this was performed yesterday.  Please refer to consult note on 12/15 and  please re-consult if further assistance is needed.   Thank-you,  Julien Girt MSN, [place], [place], [place], [place]

## 2015-03-15 NOTE — Plan of Care (Signed)
Problem: Activity: Goal: Ability to tolerate increased activity will improve Outcome: Progressing Pt able to sit in chair for 3 hours today, pain medications administered d/t complaint of pain.   Problem: Nutritional: Goal: Ability to make healthy dietary choices will improve Outcome: Progressing Patient able to eat a few bites of breakfast tray, refused to eat lunch tray.

## 2015-03-15 NOTE — Progress Notes (Signed)
Subjective: [name] continues to improve symptomatically, denying recurrent cough or shortness of breath. She still endorses pain on her buttocks 2/2 moisture-associated wounds which make sitting up in her chair more difficult, on top of her decondition secondary to chronic critical illness. She denies fevers, chills, malaise, myalgias, chest pain, palpitations, abdominal pain, urinary or bowel symptoms, focal weakness, or increased leg swelling.   Objective: Vital signs in last 24 hours: Filed Vitals:   03/14/15 1340 03/14/15 1848 03/14/15 2054 03/15/15 0540  BP: 145/93 107/61 110/65 116/85  Pulse: 92 119 113 111  Temp: 97.9 F (36.6 C)  99.2 F (37.3 C) 99.3 F (37.4 C)  TempSrc: Oral  Oral Oral  Resp: 18  18 16   Height:      Weight: 166 lb 0.1 oz (75.3 kg)   170 lb 13.7 oz (77.5 kg)  SpO2: 100%  98% 100%   Weight change: 3.5 oz (0.1 kg)  Intake/Output Summary (Last 24 hours) at 03/15/15 1138 Last data filed at 03/14/15 2218  Gross per 24 hour  Intake    450 ml  Output   1645 ml  Net  -1195 ml   General Apperance: NAD HEENT: Normocephalic, atraumatic, PERRL, EOMI, anicteric sclera Neck: Supple, trachea midline Lungs: Normal effort, slightly decreased breath sounds bilaterally. No wheezes, rales, or rhonchi auscultated. Heart: Regular rate and rhythm Abdomen: Soft, nontender, nondistended, no rebound/guarding Extremities: Warm and well perfused, 2+ edema of the left thigh around graft site, 1+ distal LLE edema. Bandages in L femoral are c/d/i. Scattered ecchymoses in the R antecubital fossa. Bandages over R hand at previous sticks. Pulses: 1+ throughout Skin: No rashes or lesions Neurologic: Alert and oriented x 3. No gross deficits. Psych: Improved affect, still flat but less so than previous exams.   Lab Results: Basic Metabolic Panel:  Recent Labs Lab 03/12/15 0845 03/13/15 0840 03/14/15 0615  NA 130* 136 137  K 2.9* 3.3* 4.1  CL 97* 103 104  CO2 26 28 27     GLUCOSE 70 75 69  BUN 29* 13 18  CREATININE 4.55* 2.67* 3.60*  CALCIUM 8.5* 8.2* 8.6*  PHOS 3.1  --  1.4*   Liver Function Tests:  Recent Labs Lab 03/11/15 2016  03/12/15 0845 03/14/15 0615  PROT 6.9  --   --   --   ALBUMIN  --   < > 1.6* 1.5*  < > = values in this interval not displayed. CBC:  Recent Labs Lab 03/14/15 0615 03/15/15 0921  WBC 7.2 7.0  HGB 6.5* 10.9*  HCT 21.8* 33.2*  MCV 99.1 94.1  PLT 141* 47*   Coagulation:  Recent Labs Lab 03/12/15 0655 03/13/15 0603 03/14/15 0615 03/15/15 0453  LABPROT 16.3* 15.6* 15.8* 16.5*  INR 1.30 1.22 1.24 1.32    Micro Results: Recent Results (from the past 240 hour(s))  MRSA PCR Screening     Status: None   Collection Time: 03/09/15  7:48 PM  Result Value Ref Range Status   MRSA by PCR NEGATIVE NEGATIVE Final    Comment:        The GeneXpert MRSA Assay (FDA approved for NASAL specimens only), is one component of a comprehensive MRSA colonization surveillance program. It is not intended to diagnose MRSA infection nor to guide or monitor treatment for MRSA infections.   Culture, body fluid-bottle     Status: None (Preliminary result)   Collection Time: 03/11/15  2:40 PM  Result Value Ref Range Status   Specimen Description FLUID  LEFT PLEURAL  Final   Special Requests NONE  Final   Culture NO GROWTH 4 DAYS  Final   Report Status PENDING  Incomplete  Gram stain     Status: None   Collection Time: 03/11/15  2:40 PM  Result Value Ref Range Status   Specimen Description FLUID LEFT PLEURAL  Final   Special Requests NONE  Final   Gram Stain   Final    MODERATE WBC PRESENT,BOTH PMN AND MONONUCLEAR NO ORGANISMS SEEN    Report Status 03/11/2015 FINAL  Final   Assessment/Plan: 39 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   Acute on chronic  anemia and thrombocytopenia: Most likely 2/2 lupus flare as pt has history of similar episodes but Unasyn is also a potential precipitant. Platelet nadir of <5. 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 LDH only mildly elevated with inappropriately low retic, likely not hemolyzing currently and may be more of a hypoproliferative process. C3 is decreased, C4 normal. dsDNA 10. Likely 2/2 lupus flare. Did not respond well to steroid treatment with moderate bleeding. Improved with IVIG and romiplastim, but platelets decreased from 140s to 40s on 12/16 without bleeding currently. -s/p IVIG x 5 days (ending 12/13) with romiplastim support for steroid-refractory SLE-induced thrombocytopenia (failed DMS x 4d ending 12/9) -Will re-consult heme, also consider re-starting DMS (12/16 day 11 of overall treatment) -Transfused 2U PRBCs and 1U plts on 12/6. 1U plts on 12/7. 2U on 12/8. Transfuse PRN. Transfused 2U PRBCs on 12/16 during HD. -Consulted case with Dr. Norva Riffle in Rheumatology and Dr. Jana Hakim in Hematology - concordance with current plan -F/u CBCs -Hold coumadin - was given vitamin K on 12/10 for supratherapeutic INR in the setting of requiring pleural fluid procedures  Hospital-acquired pneumonia - nonproductive cough with clear rhinorrhea and sore throat. CXR showed no consolidation with likely fluid overload pattern possible explaining the cough, much worse from previous imaging. Afebrile, rhonchi on exam. Discussed with Dr. Jonnie Finner on 12/10. Procalcitonin on 12/10 was 6.14, very high and suggestive of a bacterial infection despite more viral s/s. Note that despite her ESRD and lupus, procalcitonin is still well-studied to differentiate SLE flares/viral illness vs bacterial illness with a suggested elevated cutoff of 0.75 (vs 0.5 in non SLE/ESRD). MRSA PCR negative. CT showed bilateral pleural effusions, pleural nodular thickening and adenopathy vs nodule in the median L lung. Symptoms are improving  significant and is less congested on exam. L-sided diagnostic US-guided thoracentesis on 12/12 showed exudative effusion, bloody/turbid, with 1400 WBC, 57% PMNs, low glucose of 50. These findings are possibly explained by SLE alone but in the setting of HAP treatment with diffuse parenchymal involvement, we are concerned about possibility of parapneumonic effusion. -Consulted pulmonology and CVTS for further assistance with pleural fluid interpretation and further management - appreciate recs -Continue ceftazidime, day 7/7 for HAP. Also got vanc x 1 on day 1 but d/c with MRSA PCR negative. -Continue HD -Hycodin PRN  Swallowing difficulties - difficult to exam oropharynx currently but history suggestive of candidiasis vs functional swallowing difficulties. No other reflux symptoms or esophagitis symptoms, issue is difficulty initiating swallow x 2 weeks. -SLP evaluation as not responding  Acute psychosis, Depression: She had acute confusion and hallucinations secondary UTI. Her symptoms improved but worsened 12/9. 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 Low suspicion for Lupus cerebritis. She remains afebrile.  Repeat blood cultures on 11/30 NGTD. These symptoms continue to resolve with olanzapine. -D/c'ed Unasyn on 12/5 2/2 thrombocytopenia - received 10 days of abx (initially to complete 14 day course from 11/25 (end date: 12/9)) -Continue olanzapine 5mg  BID -Monitor QTc -Continue duloxetine 30mg  QD. -Consulted psychiatry, appreciate recs  ESRD on HD, s/p Left thigh AVG:AVG placed 01/04/2015. Back on her outpatient TTS schedule. She has some left leg edema likely 2/2 AVG. Venous duplex with post surgical changes and no DVT. -Midodrine 10mg  BID -Nephrology following, appreciate recs -HD per nephrology -Percocet 5/325mg  q 6 hours prn pain -Decreased coreg for soft pressures -CXR on 12/9 shows fluid congestion - HD per nephrology  Hx recent R. MCA Embolic CVA -Daily  PT/INR -Coumadin per pharm -Continue PT. CIR recs SNF but pt declines currently and wants home health. Needs to be able to sit in a dialysis chair for ~3 hours. Encouraging daily PT, nursing  SLE: Continue Plaquenil  Hx SVT/Tachycardia: Decreased toprol XL to 12.5mg  2/2 pressures  FEN: Renal VTE ppx: Coumadin  FULL CODE  Dispo: Will likely go to SNF or [facility] upon discharge.  Anticipated discharge in approximately 2-3 day(s).   The patient does have a current PCP Lin Landsman, MD) and does not need an [facility] hospital follow-up appointment after discharge.  The patient does not have transportation limitations that hinder transportation to clinic appointments.   LOS: 24 days   Norval Gable, MD 03/15/2015, 11:38 AM

## 2015-03-15 NOTE — Progress Notes (Signed)
Physical Therapy Treatment Patient Details Name: [name] MRN: [id number] DOB: 1975/06/23 Today's Date: 03/15/2015    History of Present Illness Pt is a 39 y.o. F from [place] who has had several recent hospitalizations.  She had a Rt MCA infarction and developed pseudomonal bacteremia from Rt femoral HD line which was treated.  Was doing well in CIR until she acutely developed severe confusion and hallucinations following HD sessions, likely a result of sepsis from Lt ant thigh abscess. Pt's additional PMH includes lupus, fibromyalgia, anemia.    PT Comments    Pt continues to be extremely weak and continues to have declining mobility. Attempting to sit in recliner for 3 hours.  Follow Up Recommendations  SNF (Pt now agreeable). Pt will need to be able to tolerate HD in recliner.      Equipment Recommendations  Other (comment) (Has received w/c and cushion)    Recommendations for Other Services OT consult     Precautions / Restrictions Precautions Precautions: Fall Restrictions Weight Bearing Restrictions: No    Mobility  Bed Mobility Overal bed mobility: Needs Assistance Bed Mobility: Rolling;Sidelying to Sit;Sit to Sidelying Rolling: Mod assist Sidelying to sit: Max assist     Sit to sidelying: Min assist General bed mobility comments: Assist to elevate trunk and bring hips to EOB. Assist to bring legs back into bed.  Transfers Overall transfer level: Needs assistance Equipment used: Ambulation equipment used             General transfer comment: Attempted to stand pt x 2 with Stedy but pt unable to bring hips off bed with +2 total assist. Used Maxisky to transfer to chair.   Ambulation/Gait                 Stairs            Wheelchair Mobility    Modified Rankin (Stroke Patients Only)       Balance Overall balance assessment: Needs assistance Sitting-balance support: No upper extremity supported;Feet supported Sitting balance-Leahy  Scale: Fair Sitting balance - Comments: Sat EOB x 8 minutes with supervision.                            Cognition Arousal/Alertness: Awake/alert Behavior During Therapy: Anxious Overall Cognitive Status: Within Functional Limits for tasks assessed                 General Comments: MD's explained importance of sitting in recliner for extended time for OP HD. Pt acknowledged this but as soon as up in chair pt asking me not to leave because she didn't want to stay in chair.    Exercises General Exercises - Lower Extremity Short Arc Quad: Strengthening;Both;10 reps;Supine;AAROM Long [employer]: Strengthening;Both;10 reps;Seated;AAROM Heel Slides: AAROM;Strengthening;Both;10 reps;Supine Hip ABduction/ADduction: AAROM;Strengthening;Both;Supine;20 reps (10 seated, 10 supine) Hip Flexion/Marching: Strengthening;Both;10 reps;Seated    General Comments        Pertinent Vitals/Pain Pain Assessment: 0-10 Pain Score: 9  Pain Location: buttocks Pain Descriptors / Indicators: Aching Pain Intervention(s): Limited activity within patient's tolerance;Monitored during session;Repositioned    Home Living                      Prior Function            PT Goals (current goals can now be found in the care plan section) Acute Rehab PT Goals PT Goal Formulation: With patient/family Time For Goal Achievement: 03/29/15 Potential to  Achieve Goals: Fair Progress towards PT goals: Goals downgraded-see care plan    Frequency  Min 3X/week    PT Plan Discharge plan needs to be updated    Co-evaluation             End of Session   Activity Tolerance: Patient limited by fatigue Patient left: with call bell/phone within reach;in chair     Time: 0910-1005 PT Time Calculation (min) (ACUTE ONLY): 55 min  Charges:  $Therapeutic Exercise: 8-22 mins $Therapeutic Activity: 38-52 mins                    G Codes:      Sabryn Preslar Mar 18, 2015, 11:17 AM [employer] PT [phone]

## 2015-03-15 NOTE — Progress Notes (Signed)
[facility] Progress Note   Subjective: no new complaints  Filed Vitals:   03/14/15 1340 03/14/15 1848 03/14/15 2054 03/15/15 0540  BP: 145/93 107/61 110/65 116/85  Pulse: 92 119 113 111  Temp: 97.9 F (36.6 C)  99.2 F (37.3 C) 99.3 F (37.4 C)  TempSrc: Oral  Oral Oral  Resp: 18  18 16   Height:      Weight: 75.3 kg (166 lb 0.1 oz)   77.5 kg (170 lb 13.7 oz)  SpO2: 100%  98% 100%    Inpatient medications: . sodium chloride   Intravenous Once  . B-complex with vitamin C  1 tablet Oral Daily  . Darbepoetin Alfa  200 mcg Intravenous Q Thu-HD  . DULoxetine  40 mg Oral Daily  . feeding supplement  1 Container Oral BID BM  . feeding supplement (PRO-STAT SUGAR FREE 64)  30 mL Oral Daily  . hydroxychloroquine  400 mg Oral Daily  . metoprolol succinate  12.5 mg Oral q1800  . midodrine  5 mg Oral BID WC  . multivitamin  1 tablet Oral BID  . nystatin  5 mL Oral QID  . OLANZapine zydis  10 mg Oral BID PC  . pantoprazole  40 mg Oral Daily  . sodium chloride  3 mL Intravenous Q12H  . warfarin  5 mg Oral ONCE-1800  . Warfarin - Pharmacist Dosing Inpatient   Does not apply q1800     acetaminophen, acetaminophen, Influenza vac split quadrivalent PF, ondansetron **OR** ondansetron (ZOFRAN) IV, oxyCODONE-acetaminophen, pneumococcal 23 valent vaccine, polyethylene glycol, sodium chloride  Exam: Chron ill appearing young AAF No jvd Chest dec'd BS bilat bases L > R RRR no mrg Abd obese soft ntnd  Ext ^bruising UE's L thigh 1-2+ edema, improved 1+ pretib edema L thigh AVG +bruit Neuro bilat LE weak 2/5, nonfocal, Ox 3  [facility] TTS 4 hr 87.5kg 2/2 bath L thigh AVG Hep 2600 Aranesp 200/week  Assessment: 1 PNA / pleural effusion - parapneumonic effusions, better, on IV abx, sp L thoracentesis 2 Thrombocytopenia- resolved , sp IVIG/ thromboplastin 3 ESRD L thigh graft 4 Recent pseudomonas cath sepsis 5 SLE plaquenil 6 AMS/ acute psychosis resolved 7 SP CVA/  marantic endocarditis/ hypercoag - off coumadin now 8 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 Low Fe. No IV Fe w active infection, transfuse prn 11 MBD low Ca bath 12 [place] mvi/ Bcomplex 13 SP Acinetobacter UTI 14 Volume euvolemic, down 10kg from prior edw 15 Hypokalemia better 16 Debil must be able to sit in chair for HD before can be dc'd to SNF   Plan - HD Sat, try to dialyze in a chair    Kelly Splinter MD [place] pager [phone]    cell [phone] 03/15/2015, 11:39 AM    Recent Labs Lab 03/12/15 0655 03/12/15 0845 03/13/15 0840 03/14/15 0615  NA 132* 130* 136 137  K 2.9* 2.9* 3.3* 4.1  CL 97* 97* 103 104  CO2 26 26 28 27   GLUCOSE 69 70 75 69  BUN 29* 29* 13 18  CREATININE 4.45* 4.55* 2.67* 3.60*  CALCIUM 8.8* 8.5* 8.2* 8.6*  PHOS 3.2 3.1  --  1.4*    Recent Labs Lab 03/11/15 2016 03/12/15 0655 03/12/15 0845 03/14/15 0615  PROT 6.9  --   --   --   ALBUMIN  --  1.8* 1.6* 1.5*    Recent Labs Lab 03/13/15 0840 03/14/15 0615 03/15/15 [id number]  WBC 6.3 7.2 7.0  HGB 6.5* 6.5* 10.9*  HCT 21.8* 21.8* 33.2*  MCV 99.5 99.1 94.1  PLT 152 141* 47*

## 2015-03-16 ENCOUNTER — Inpatient Hospital Stay (HOSPITAL_COMMUNITY): Payer: Medicaid Other

## 2015-03-16 DIAGNOSIS — J90 Pleural effusion, not elsewhere classified: Secondary | ICD-10-CM | POA: Insufficient documentation

## 2015-03-16 LAB — CBC
HEMATOCRIT: 33.1 % — AB (ref 36.0–46.0)
HEMOGLOBIN: 10.8 g/dL — AB (ref 12.0–15.0)
MCH: 30.3 pg (ref 26.0–34.0)
MCHC: 32.6 g/dL (ref 30.0–36.0)
MCV: 93 fL (ref 78.0–100.0)
Platelets: 30 10*3/uL — ABNORMAL LOW (ref 150–400)
RBC: 3.56 MIL/uL — AB (ref 3.87–5.11)
RDW: 18 % — ABNORMAL HIGH (ref 11.5–15.5)
WBC: 11.8 10*3/uL — ABNORMAL HIGH (ref 4.0–10.5)

## 2015-03-16 LAB — BASIC METABOLIC PANEL
Anion gap: 7 (ref 5–15)
BUN: 21 mg/dL — AB (ref 6–20)
CHLORIDE: 101 mmol/L (ref 101–111)
CO2: 27 mmol/L (ref 22–32)
CREATININE: 3.87 mg/dL — AB (ref 0.44–1.00)
Calcium: 8.7 mg/dL — ABNORMAL LOW (ref 8.9–10.3)
GFR calc non Af Amer: 14 mL/min — ABNORMAL LOW (ref >60)
GFR, EST AFRICAN AMERICAN: 16 mL/min — AB (ref >60)
GLUCOSE: 71 mg/dL (ref 65–99)
Potassium: 3.4 mmol/L — ABNORMAL LOW (ref 3.5–5.1)
Sodium: 135 mmol/L (ref 135–145)

## 2015-03-16 LAB — CULTURE, BODY FLUID-BOTTLE: CULTURE: NO GROWTH

## 2015-03-16 LAB — PROTIME-INR
INR: 1.41 (ref 0.00–1.49)
Prothrombin Time: 17.4 seconds — ABNORMAL HIGH (ref 11.6–15.2)

## 2015-03-16 LAB — CULTURE, BODY FLUID W GRAM STAIN -BOTTLE

## 2015-03-16 MED ORDER — ROMIPLOSTIM 250 MCG ~~LOC~~ SOLR
1.0000 ug/kg | SUBCUTANEOUS | Status: DC
  Administered 2015-03-16: 75 ug via SUBCUTANEOUS
  Filled 2015-03-16: qty 0.15

## 2015-03-16 MED ORDER — GUAIFENESIN-DM 100-10 MG/5ML PO SYRP
5.0000 mL | ORAL_SOLUTION | ORAL | Status: DC | PRN
  Administered 2015-03-16 – 2015-03-17 (×4): 5 mL via ORAL
  Filled 2015-03-16 (×6): qty 5

## 2015-03-16 MED ORDER — DEXTROSE 5 % IV SOLN
2.0000 g | INTRAVENOUS | Status: DC
  Filled 2015-03-16: qty 2

## 2015-03-16 MED ORDER — SENNOSIDES-DOCUSATE SODIUM 8.6-50 MG PO TABS
2.0000 | ORAL_TABLET | Freq: Once | ORAL | Status: DC | PRN
  Filled 2015-03-16 (×2): qty 2

## 2015-03-16 MED ORDER — POLYETHYLENE GLYCOL 3350 17 G PO PACK
17.0000 g | PACK | Freq: Every day | ORAL | Status: DC
  Administered 2015-03-16 – 2015-03-22 (×4): 17 g via ORAL
  Filled 2015-03-16 (×5): qty 1

## 2015-03-16 MED ORDER — SODIUM PHOSPHATE 3 MMOLE/ML IV SOLN
20.0000 mmol | Freq: Once | INTRAVENOUS | Status: AC
  Administered 2015-03-16: 20 mmol via INTRAVENOUS
  Filled 2015-03-16: qty 6.67

## 2015-03-16 MED ORDER — POTASSIUM & SODIUM PHOSPHATES 280-160-250 MG PO PACK
1.0000 | PACK | Freq: Two times a day (BID) | ORAL | Status: AC
  Administered 2015-03-17: 1 via ORAL
  Filled 2015-03-16 (×6): qty 1

## 2015-03-16 MED ORDER — WARFARIN SODIUM 5 MG PO TABS: 5.0000 mg | ORAL_TABLET | Freq: Once | ORAL | Status: DC

## 2015-03-16 MED ORDER — SENNOSIDES-DOCUSATE SODIUM 8.6-50 MG PO TABS
2.0000 | ORAL_TABLET | Freq: Every day | ORAL | Status: DC
  Administered 2015-03-16 – 2015-03-21 (×5): 2 via ORAL
  Filled 2015-03-16 (×5): qty 2

## 2015-03-16 MED ORDER — DEXAMETHASONE 6 MG PO TABS
20.0000 mg | ORAL_TABLET | Freq: Two times a day (BID) | ORAL | Status: DC
  Administered 2015-03-16 – 2015-03-19 (×7): 20 mg via ORAL
  Filled 2015-03-16 (×9): qty 1

## 2015-03-16 MED ORDER — ALBUMIN HUMAN 25 % IV SOLN
25.0000 g | Freq: Once | INTRAVENOUS | Status: AC
  Administered 2015-03-16: 25 g via INTRAVENOUS

## 2015-03-16 MED ORDER — ALBUMIN HUMAN 25 % IV SOLN
INTRAVENOUS | Status: AC
  Filled 2015-03-16: qty 50

## 2015-03-16 NOTE — Progress Notes (Signed)
Patient is febrile this AM, oral temperature 100.4 F and also BP 88/52 manually. PRN Tylenol was given. Internal Medicine MD paged and notified.

## 2015-03-16 NOTE — Progress Notes (Signed)
Subjective: No acute events overnight. Doing well this morning. Denies chest pain. She right sided pleuritic chest pain. Cough last night but none this morning.   Objective: Vital signs in last 24 hours: Filed Vitals:   03/15/15 2341 03/16/15 0441 03/16/15 0539 03/16/15 0603  BP: 96/62  84/41 88/52  Pulse:   119   Temp:   100.4 F (38 C)   TempSrc:   Oral   Resp:   20   Height:      Weight:  167 lb 12.8 oz (76.114 kg)    SpO2:   96%    Weight change: -2 lb 2.8 oz (-0.986 kg)  Intake/Output Summary (Last 24 hours) at 03/16/15 1229 Last data filed at 03/16/15 0147  Gross per 24 hour  Intake    170 ml  Output      0 ml  Net    170 ml   General Apperance: NAD HEENT: Normocephalic, atraumatic, PERRL, EOMI, anicteric sclera Neck: Supple, trachea midline Lungs: Decreased breath sounds at right lower base. Clear to auscultation otherwise. Heart: Regular rate and rhythm Abdomen: Soft, nontender, nondistended, no rebound/guarding Extremities: Warm and well perfused, 2+ edema of the left thigh around graft site, 1+ distal LLE edema Pulses: 1+ throughout Skin: No rashes or lesions Neurologic: Alert and oriented x 3. No gross deficits. Psych: Flat affect but appropriate.  Lab Results: Basic Metabolic Panel:  Recent Labs Lab 03/12/15 0845 03/13/15 0840 03/14/15 0615  NA 130* 136 137  K 2.9* 3.3* 4.1  CL 97* 103 104  CO2 26 28 27   GLUCOSE 70 75 69  BUN 29* 13 18  CREATININE 4.55* 2.67* 3.60*  CALCIUM 8.5* 8.2* 8.6*  PHOS 3.1  --  1.4*   Liver Function Tests:  Recent Labs Lab 03/11/15 2016  03/12/15 0845 03/14/15 0615  PROT 6.9  --   --   --   ALBUMIN  --   < > 1.6* 1.5*  < > = values in this interval not displayed. CBC:  Recent Labs Lab 03/15/15 0921 03/16/15 0510  WBC 7.0 11.8*  HGB 10.9* 10.8*  HCT 33.2* 33.1*  MCV 94.1 93.0  PLT 47* 30*   Coagulation:  Recent Labs Lab 03/13/15 0603 03/14/15 0615 03/15/15 0453 03/16/15 0510  LABPROT 15.6*  15.8* 16.5* 17.4*  INR 1.22 1.24 1.32 1.41    Micro Results: Recent Results (from the past 240 hour(s))  MRSA PCR Screening     Status: None   Collection Time: 03/09/15  7:48 PM  Result Value Ref Range Status   MRSA by PCR NEGATIVE NEGATIVE Final    Comment:        The GeneXpert MRSA Assay (FDA approved for NASAL specimens only), is one component of a comprehensive MRSA colonization surveillance program. It is not intended to diagnose MRSA infection nor to guide or monitor treatment for MRSA infections.   Culture, body fluid-bottle     Status: None   Collection Time: 03/11/15  2:40 PM  Result Value Ref Range Status   Specimen Description FLUID LEFT PLEURAL  Final   Special Requests NONE  Final   Culture NO GROWTH 5 DAYS  Final   Report Status 03/16/2015 FINAL  Final  Gram stain     Status: None   Collection Time: 03/11/15  2:40 PM  Result Value Ref Range Status   Specimen Description FLUID LEFT PLEURAL  Final   Special Requests NONE  Final   Gram Stain   Final  MODERATE WBC PRESENT,BOTH PMN AND MONONUCLEAR NO ORGANISMS SEEN    Report Status 03/11/2015 FINAL  Final  Culture, blood (routine x 2)     Status: None (Preliminary result)   Collection Time: 03/16/15 10:45 AM  Result Value Ref Range Status   Specimen Description BLOOD RIGHT HAND  Final   Special Requests IN PEDIATRIC BOTTLE 0.5CC  Final   Culture PENDING  Incomplete   Report Status PENDING  Incomplete   Studies/Results: Dg Chest Port 1 View  03/16/2015  CLINICAL DATA:  Fever, lupus, stroke, end-stage renal disease, TIA, hypertension, arrhythmias EXAM: PORTABLE CHEST 1 VIEW COMPARISON:  Portable exam 1108 hours compared to 03/11/2015 FINDINGS: Rotation to the LEFT. Enlargement of cardiac silhouette with pulmonary vascular congestion. Mediastinal contours normal. Bibasilar effusions. Atelectasis versus consolidation LEFT lower lobe with minimal atelectasis at RIGHT base. Upper lungs clear. No pneumothorax.  Bones unremarkable. IMPRESSION: Enlargement of cardiac silhouette with pulmonary vascular congestion. Bibasilar effusions with minimal RIGHT basilar atelectasis and more pronounced atelectasis versus consolidation in LEFT lower lobe. Electronically Signed   By: Lavonia Dana M.D.   On: 03/16/2015 11:29     Medications: I have reviewed the patient's current medications. Scheduled Meds: . sodium chloride   Intravenous Once  . B-complex with vitamin C  1 tablet Oral Daily  . Darbepoetin Alfa  200 mcg Intravenous Q Thu-HD  . DULoxetine  40 mg Oral Daily  . feeding supplement  1 Container Oral BID BM  . feeding supplement (PRO-STAT SUGAR FREE 64)  30 mL Oral Daily  . hydroxychloroquine  400 mg Oral Daily  . metoprolol succinate  12.5 mg Oral q1800  . midodrine  5 mg Oral BID WC  . multivitamin  1 tablet Oral BID  . nystatin  5 mL Oral QID  . OLANZapine zydis  10 mg Oral BID PC  . pantoprazole  40 mg Oral Daily  . romiPLOStim  1 mcg/kg Subcutaneous Weekly  . 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   Hospital acquired pneumonia: Completed 7 days of ceftazidime on 12/16. She has bilateral pleural effusions. Previously discussed with pulmonology and CT surgery - poor candidate for surgical intervention. -Temp to 100.4 this morning. Repeat blood cultures sent. Will try to obtain UCx via cath - she makes very little urine. IR consulted for diagnostic thoracentesis for culture, consider  therapeutic thoracentesis. -Restart ceftazidime. If she worsens clinically, may consider adding vancomcyin.  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 Platelets increased from <5 to 140k. Down to 47k yesterday and 30k today. -Heme following, appreciate recommendations -NPlate weekly -Dexamethasone 20mg  BID -Continue Plaquenil 400mg  daily -Will need to discuss with rheumatology possible escalation of therapy for lupus.  Psychosis, Depression: She had acute confusion and hallucinations secondary UTI. Her symptoms improved but worsened 12/9. 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 Low suspicion for Lupus cerebritis. Psychosis resolved. -Continue olanzapine 5mg  BID -Continue duloxetine 40mg  QD  ESRD on HD, s/p Left thigh AVG:AVG placed 01/04/2015. Back on her outpatient TTS schedule. She has some left leg edema  likely 2/2 AVG. Venous duplex with post surgical changes and no DVT. -Midodrine 5mg  BID -Nephrology following, appreciate recs -HD per nephrology  Acute on chronic anemia: Hgb stable at 10.8 from 10.9 yesterday. -Continue to monitor. Transfuse prn for hgb <7.  Hx recent R. MCA Embolic CVA:  -Daily PT/INR -Coumadin held while plt <50.  Deconditioning: She reports she was able to sit in a chair for 4 hours yesterday. -Continue PT  Hx SVT/Tachycardia: Continue Toprol-XL 12.5mg  daily  FEN: Renal VTE ppx: SCDs  FULL CODE  Dispo: Disposition is deferred at this time, awaiting improvement of current medical problems. Anticipated discharge in approximately 1-2 day(s).   The patient does have a current PCP Lin Landsman, MD) and does not need an [facility] hospital follow-up appointment after discharge.  The patient does not have transportation limitations that hinder transportation to clinic appointments.  Services Needed at time of discharge: Y = Yes, Blank = No PT:   OT:   RN:     Equipment:   Other:     LOS: 25 days   Milagros Loll, MD 03/16/2015, 12:29 PM

## 2015-03-16 NOTE — Progress Notes (Signed)
[facility] Progress Note  Assessment/Plan: 1 PNA / pleural effusion - parapneumonic effusions- last Tressie Ellis was 12/15 - but resumed today due to fever sp previous L thoracentesis- for repeat; CXR today showed LLL atx vs consolidation 2 Thrombocytopenia- had resolved  sp IVIG/ thromboplastin now abrupt drop 12/16 47 and 12/17 30 - dexamethasone 20 bid resumed 3 ESRD TTS L thigh graft- no heparin due to low platelets 4 Recent pseudomonas cath sepsis 5 SLE plaquenil 6 AMS/ acute psychosis resolved 7 SP CVA/ marantic endocarditis/ hypercoag - back on coumadin but platelets dropped - last coumadin 12/16 8 Hypotension on midodrine/ BP in the 90s dropped into 80s - will give alb 25 gm IV x 1 for support 9 SVT on MTP 12.5 q day 10 Anemia max esa Q Thursday ; Hgb 10.8 stable.  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 New fever -Tmax 100.4- on fortaz only for now- last Vanc was 12/10, WBC up  Blood cultures drawn this am; IR to do a diagnoistic/possible therapeutic thoracentesis;   Myriam Jacobson, PA-C [place] [phone] 03/16/2015,1:00 PM  LOS: 25 days   Pt seen, examined and agree w A/P as above.  Kelly Splinter MD [facility] pager [phone]    cell [phone] 03/16/2015, 2:48 PM     Subjective:   Please with liberalization of diet to regular. Still with dry cough. SOB at rest off O2    Objective Filed Vitals:   03/15/15 2341 03/16/15 0441 03/16/15 0539 03/16/15 0603  BP: 96/62  84/41 88/52  Pulse:   119   Temp:   100.4 F (38 C)   TempSrc:   Oral   Resp:   20   Height:      Weight:  76.114 kg (167 lb 12.8 oz)    SpO2:   96%    Physical Exam 96% sat on room air goal 2 L General: ill appearing Heart: tachy reg Lungs: dim bases Abdomen: soft nt Extremities: muscle wasting/no edema - feet elevated in recliner Dialysis Access: left thigh AVGG   Dialysis Orders: TTS 4 hr 87.5kg 2/2 bath L thigh AVG Hep 2600 Aranesp 200/week  Additional Objective Labs: Lab Results  Component Value Date   INR 1.41 03/16/2015   INR 1.32 03/15/2015   INR 1.24 [id number]    Basic Metabolic Panel:  Recent Labs Lab 03/12/15 0655 03/12/15 0845 03/13/15 0840 03/14/15 0615  NA 132* 130* 136 137  K 2.9* 2.9* 3.3* 4.1  CL 97* 97* 103 104  CO2 26 26 28 27   GLUCOSE 69 70 75 69  BUN 29* 29* 13 18  CREATININE 4.45* 4.55* 2.67* 3.60*  CALCIUM 8.8* 8.5* 8.2* 8.6*  PHOS 3.2 3.1  --  1.4*   Liver Function Tests:  Recent Labs Lab 03/11/15 2016 03/12/15 0655 03/12/15 0845 03/14/15 0615  PROT 6.9  --   --   --  ALBUMIN  --  1.8* 1.6* 1.5*   CBC:  Recent Labs Lab 03/12/15 0845 03/13/15 0840 03/14/15 0615 03/15/15 0921 03/16/15 0510  WBC 8.2 6.3 7.2 7.0 11.8*  HGB 7.1* 6.5* 6.5* 10.9* 10.8*  HCT 23.9* 21.8* 21.8* 33.2* 33.1*  MCV 97.2 99.5 99.1 94.1 93.0  PLT 150 152 141* 47* 30*   Blood Culture    Component Value Date/Time   SDES BLOOD RIGHT HAND 03/16/2015 1045   SPECREQUEST IN PEDIATRIC BOTTLE 0.5CC 03/16/2015 1045   CULT PENDING 03/16/2015 1045   REPTSTATUS PENDING 03/16/2015 1045    Studies/Results: Dg Chest Port 1 View  03/16/2015  CLINICAL DATA:  Fever, lupus, stroke, end-stage renal disease, TIA, hypertension, arrhythmias EXAM: PORTABLE CHEST 1 VIEW COMPARISON:  Portable exam 1108 hours compared to 03/11/2015 FINDINGS: Rotation to the LEFT.  Enlargement of cardiac silhouette with pulmonary vascular congestion. Mediastinal contours normal. Bibasilar effusions. Atelectasis versus consolidation LEFT lower lobe with minimal atelectasis at RIGHT base. Upper lungs clear. No pneumothorax. Bones unremarkable. IMPRESSION: Enlargement of cardiac silhouette with pulmonary vascular congestion. Bibasilar effusions with minimal RIGHT basilar atelectasis and more pronounced atelectasis versus consolidation in LEFT lower lobe. Electronically Signed   By: Lavonia Dana M.D.   On: 03/16/2015 11:29   Medications:   . sodium chloride   Intravenous Once  . B-complex with vitamin C  1 tablet Oral Daily  . cefTAZidime (FORTAZ)  IV  2 g Intravenous Q T,Th,Sat-1800  . Darbepoetin Alfa  200 mcg Intravenous Q Thu-HD  . dexamethasone  20 mg Oral Q12H  . DULoxetine  40 mg Oral Daily  . feeding supplement  1 Container Oral BID BM  . feeding supplement (PRO-STAT SUGAR FREE 64)  30 mL Oral Daily  . hydroxychloroquine  400 mg Oral Daily  . metoprolol succinate  12.5 mg Oral q1800  . midodrine  5 mg Oral BID WC  . multivitamin  1 tablet Oral BID  . nystatin  5 mL Oral QID  . OLANZapine zydis  10 mg Oral BID PC  . pantoprazole  40 mg Oral Daily  . romiPLOStim  1 mcg/kg Subcutaneous Weekly  . sodium chloride  3 mL Intravenous Q12H

## 2015-03-16 NOTE — Progress Notes (Addendum)
ANTICOAGULATION CONSULT NOTE - Follow Up Consult  Pharmacy Consult for Coumadin Indication: stroke and hypercoaguable state  Patient Measurements: Height: 5\' 3"  (160 cm) Weight: 167 lb 12.8 oz (76.114 kg) IBW/kg (Calculated) : 52.4  Vital Signs: Temp: 100.4 F (38 C) (12/17 0539) Temp Source: Oral (12/17 0539) BP: 88/52 mmHg (12/17 0603) Pulse Rate: 119 (12/17 0539)  Labs:  Recent Labs  03/14/15 0615 03/15/15 0453 03/15/15 0921 03/16/15 0510  HGB 6.5*  --  10.9* 10.8*  HCT 21.8*  --  33.2* 33.1*  PLT 141*  --  47* 30*  LABPROT 15.8* 16.5*  --  17.4*  INR 1.24 1.32  --  1.41  CREATININE 3.60*  --   --   --     Assessment: 39 yr old female with ESRD on Coumadin for history of SLE, stroke, and hypercoagulable state. She had been on Coumadin 5 mg daily from 11/16-11/22/16, with gradual rise to therapeutic INR after 5 days. Coumadin has been held since 12/6 due to thrombocytopenia (since resolved) and precautionary in the case that any procedures were needed. Received Vitamin K 10 mg PO on 12/12 and warfarin resumed on 12/14. H/H remains low. plt 30  Date 12/14 12/15 12/16  12/17      Warfarin dose 5mg  5 mg  5 mg ------      INR 1.22 1.24 1.32 1.41        Goal of Therapy:  INR 2-3 Monitor platelets by anticoagulation protocol: Yes   Plan:  - Per MD, holding Warfarin while plt<50  Darl Pikes, PharmD Clinical Pharmacist- Resident Pager: [phone]  03/16/2015, 11:05 AM

## 2015-03-16 NOTE — Progress Notes (Signed)
[name]'s platelets have dropped below 50 K x 2 days and continue to trend down. I will start weekly Nplate today, to be repeated weekly.  While we can focus on the platelet count alone (and can repeat steroids, IVIG etc as needed) the larger question seems to me management of her lupus flare--does rheumatology feel plaquenil alone is adequate? Should she be on steroids as well for the SLE? Would rituximab be helpful in this setting?  Will follow with you.

## 2015-03-16 NOTE — Progress Notes (Addendum)
ANTIBIOTIC CONSULT NOTE - Follow Up Consult  Pharmacy Consult for Ceftazidime Indication: HCAP  Allergies  Allergen Reactions  . Food Swelling    Red peppers    Patient Measurements: Height: 5\' 3"  (160 cm) Weight: 167 lb 12.8 oz (76.114 kg) IBW/kg (Calculated) : 52.4  Vital Signs: Temp: 100.4 F (38 C) (12/17 0539) Temp Source: Oral (12/17 0539) BP: 88/52 mmHg (12/17 0603) Pulse Rate: 119 (12/17 0539)  Labs:  Recent Labs  03/14/15 0615 03/15/15 0453 03/15/15 0921 03/16/15 0510  HGB 6.5*  --  10.9* 10.8*  HCT 21.8*  --  33.2* 33.1*  PLT 141*  --  47* 30*  LABPROT 15.8* 16.5*  --  17.4*  INR 1.24 1.32  --  1.41  CREATININE 3.60*  --   --   --     Estimated Creatinine Clearance: 20.5 mL/min (by C-G formula based on Cr of 3.6).   Assessment: 17 YOF with ESRD TTSat. Patient was on ceftazidime for potential HCAP. Medication was dc'ed 12/16. Pharmacy consulted the restart medication, WBC now elevated again 11.8, Tmax 100.4    Goal of Therapy:  Eradication of infection   Plan:  -Ceftazidime 2g has been scheduled for TThSat following HD schedule -Pharmacy will follow HD schedule for abx dosing  Darl Pikes, PharmD Clinical Pharmacist- Resident Pager: [phone]   03/16/2015 12:44 PM

## 2015-03-16 NOTE — Progress Notes (Signed)
Patient complains of dry cough and requests for cough syrup. Internal Medicine MD paged and notified.

## 2015-03-17 ENCOUNTER — Inpatient Hospital Stay (HOSPITAL_COMMUNITY): Payer: Medicaid Other

## 2015-03-17 LAB — LACTATE DEHYDROGENASE, PLEURAL OR PERITONEAL FLUID: LD, Fluid: 1634 U/L — ABNORMAL HIGH (ref 3–23)

## 2015-03-17 LAB — CBC
HCT: 27.8 % — ABNORMAL LOW (ref 36.0–46.0)
Hemoglobin: 9 g/dL — ABNORMAL LOW (ref 12.0–15.0)
MCH: 30.7 pg (ref 26.0–34.0)
MCHC: 32.4 g/dL (ref 30.0–36.0)
MCV: 94.9 fL (ref 78.0–100.0)
Platelets: 30 10*3/uL — ABNORMAL LOW (ref 150–400)
RBC: 2.93 MIL/uL — ABNORMAL LOW (ref 3.87–5.11)
RDW: 18.2 % — AB (ref 11.5–15.5)
WBC: 11.4 10*3/uL — ABNORMAL HIGH (ref 4.0–10.5)

## 2015-03-17 LAB — GLUCOSE, SEROUS FLUID: Glucose, Fluid: 61 mg/dL

## 2015-03-17 LAB — RENAL FUNCTION PANEL
ALBUMIN: 2 g/dL — AB (ref 3.5–5.0)
Anion gap: 7 (ref 5–15)
BUN: 6 mg/dL (ref 6–20)
CALCIUM: 8.3 mg/dL — AB (ref 8.9–10.3)
CO2: 29 mmol/L (ref 22–32)
Chloride: 95 mmol/L — ABNORMAL LOW (ref 101–111)
Creatinine, Ser: 2.25 mg/dL — ABNORMAL HIGH (ref 0.44–1.00)
GFR calc Af Amer: 30 mL/min — ABNORMAL LOW (ref >60)
GFR calc non Af Amer: 26 mL/min — ABNORMAL LOW (ref >60)
GLUCOSE: 167 mg/dL — AB (ref 65–99)
PHOSPHORUS: 1.5 mg/dL — AB (ref 2.5–4.6)
Potassium: 4.1 mmol/L (ref 3.5–5.1)
SODIUM: 131 mmol/L — AB (ref 135–145)

## 2015-03-17 LAB — URINE MICROSCOPIC-ADD ON: BACTERIA UA: NONE SEEN

## 2015-03-17 LAB — LACTATE DEHYDROGENASE: LDH: 349 U/L — ABNORMAL HIGH (ref 98–192)

## 2015-03-17 LAB — PROTEIN, TOTAL: Total Protein: 6.7 g/dL (ref 6.5–8.1)

## 2015-03-17 LAB — URINALYSIS, ROUTINE W REFLEX MICROSCOPIC
Glucose, UA: NEGATIVE mg/dL
KETONES UR: 15 mg/dL — AB
NITRITE: NEGATIVE
Specific Gravity, Urine: 1.029 (ref 1.005–1.030)
pH: 5 (ref 5.0–8.0)

## 2015-03-17 LAB — PROTEIN, BODY FLUID: TOTAL PROTEIN, FLUID: 3.2 g/dL

## 2015-03-17 LAB — GRAM STAIN

## 2015-03-17 LAB — PROTIME-INR
INR: 1.67 — AB (ref 0.00–1.49)
PROTHROMBIN TIME: 19.7 s — AB (ref 11.6–15.2)

## 2015-03-17 MED ORDER — DEXTROSE 5 % IV SOLN
2.0000 g | INTRAVENOUS | Status: DC
  Administered 2015-03-19 – 2015-03-21 (×2): 2 g via INTRAVENOUS
  Filled 2015-03-17 (×4): qty 2

## 2015-03-17 MED ORDER — LEVOFLOXACIN 500 MG PO TABS
500.0000 mg | ORAL_TABLET | Freq: Once | ORAL | Status: AC
  Administered 2015-03-17: 500 mg via ORAL
  Filled 2015-03-17: qty 1

## 2015-03-17 MED ORDER — AMOXICILLIN-POT CLAVULANATE 500-125 MG PO TABS
1.0000 | ORAL_TABLET | Freq: Once | ORAL | Status: AC
  Administered 2015-03-17: 500 mg via ORAL
  Filled 2015-03-17: qty 1

## 2015-03-17 MED ORDER — VANCOMYCIN HCL 10 G IV SOLR
1750.0000 mg | Freq: Once | INTRAVENOUS | Status: DC
  Filled 2015-03-17: qty 1750

## 2015-03-17 MED ORDER — LIDOCAINE HCL (PF) 1 % IJ SOLN
INTRAMUSCULAR | Status: AC
  Filled 2015-03-17: qty 10

## 2015-03-17 MED ORDER — VANCOMYCIN HCL 10 G IV SOLR
1750.0000 mg | Freq: Once | INTRAVENOUS | Status: AC
  Administered 2015-03-17: 1750 mg via INTRAVENOUS
  Filled 2015-03-17: qty 1750

## 2015-03-17 MED ORDER — VANCOMYCIN HCL IN DEXTROSE 750-5 MG/150ML-% IV SOLN
750.0000 mg | INTRAVENOUS | Status: DC
  Administered 2015-03-19 – 2015-03-21 (×2): 750 mg via INTRAVENOUS
  Filled 2015-03-17 (×4): qty 150

## 2015-03-17 NOTE — Progress Notes (Signed)
Subjective: [name] had no acute events overnight. She says she feels well. She denies fevers. She did have some shortness of breath during dialysis yesterday but otherwise has not had any SOB or cough. Has not had any episodes of bleeding. Denies any other symptoms at this time.  Objective: Vital signs in last 24 hours: Filed Vitals:   03/16/15 1847 03/16/15 2132 03/17/15 0255 03/17/15 0558  BP: 96/52 78/43 106/66 131/79  Pulse: 108 112 92 86  Temp: 97.7 F (36.5 C) 98.7 F (37.1 C)  98.3 F (36.8 C)  TempSrc: Oral Oral  Oral  Resp: 19 18  18   Height:      Weight:    173 lb 8 oz (78.7 kg)  SpO2: 90% 99%  98%   Weight change: 5 lb 11.2 oz (2.586 kg)  Intake/Output Summary (Last 24 hours) at 03/17/15 0837 Last data filed at 03/17/15 0425  Gross per 24 hour  Intake    600 ml  Output    251 ml  Net    349 ml   General Apperance: NAD HEENT: Normocephalic, atraumatic, PERRL, EOMI, anicteric sclera Neck: Supple, trachea midline Lungs: Decreased breath sounds at right lower base. Clear to auscultation otherwise. Heart: Regular rate and rhythm Abdomen: Soft, nontender, nondistended, no rebound/guarding Extremities: Warm and well perfused, 2+ edema of the left thigh around graft site, 1+ distal LLE edema. Bandages around graft do not have any evidence of bleeding. Pulses: 1+ throughout Skin: No rashes or lesions Neurologic: Alert and oriented x 3. No gross deficits. Psych: Flat affect but appropriate.  Lab Results: Basic Metabolic Panel:  Recent Labs Lab 03/14/15 0615 03/16/15 1337 03/17/15 0540  NA 137 135 131*  K 4.1 3.4* 4.1  CL 104 101 95*  CO2 27 27 29   GLUCOSE 69 71 167*  BUN 18 21* 6  CREATININE 3.60* 3.87* 2.25*  CALCIUM 8.6* 8.7* 8.3*  PHOS 1.4*  --  1.5*   Liver Function Tests:  Recent Labs Lab 03/11/15 2016  03/14/15 0615 03/17/15 0540  PROT 6.9  --   --   --   ALBUMIN  --   < > 1.5* 2.0*  < > = values in this interval not  displayed. CBC:  Recent Labs Lab 03/16/15 0510 03/17/15 0540  WBC 11.8* 11.4*  HGB 10.8* 9.0*  HCT 33.1* 27.8*  MCV 93.0 94.9  PLT 30* 30*   Coagulation:  Recent Labs Lab 03/14/15 0615 03/15/15 0453 03/16/15 0510 03/17/15 0540  LABPROT 15.8* 16.5* 17.4* 19.7*  INR 1.24 1.32 1.41 1.67*    Micro Results: Recent Results (from the past 240 hour(s))  MRSA PCR Screening     Status: None   Collection Time: 03/09/15  7:48 PM  Result Value Ref Range Status   MRSA by PCR NEGATIVE NEGATIVE Final    Comment:        The GeneXpert MRSA Assay (FDA approved for NASAL specimens only), is one component of a comprehensive MRSA colonization surveillance program. It is not intended to diagnose MRSA infection nor to guide or monitor treatment for MRSA infections.   Culture, body fluid-bottle     Status: None   Collection Time: 03/11/15  2:40 PM  Result Value Ref Range Status   Specimen Description FLUID LEFT PLEURAL  Final   Special Requests NONE  Final   Culture NO GROWTH 5 DAYS  Final   Report Status 03/16/2015 FINAL  Final  Gram stain     Status: None  Collection Time: 03/11/15  2:40 PM  Result Value Ref Range Status   Specimen Description FLUID LEFT PLEURAL  Final   Special Requests NONE  Final   Gram Stain   Final    MODERATE WBC PRESENT,BOTH PMN AND MONONUCLEAR NO ORGANISMS SEEN    Report Status 03/11/2015 FINAL  Final  Culture, blood (routine x 2)     Status: None (Preliminary result)   Collection Time: 03/16/15 10:45 AM  Result Value Ref Range Status   Specimen Description BLOOD RIGHT HAND  Final   Special Requests IN PEDIATRIC BOTTLE 0.5CC  Final   Culture  Setup Time   Final    GRAM POSITIVE COCCI IN CLUSTERS AEROBIC BOTTLE ONLY CRITICAL RESULT CALLED TO, READ BACK BY AND VERIFIED WITH: J ZHU 03/17/15 @ 0730 M VESTAL    Culture GRAM POSITIVE COCCI  Final   Report Status PENDING  Incomplete   Studies/Results: Dg Chest Port 1 View  03/16/2015  CLINICAL  DATA:  Fever, lupus, stroke, end-stage renal disease, TIA, hypertension, arrhythmias EXAM: PORTABLE CHEST 1 VIEW COMPARISON:  Portable exam 1108 hours compared to 03/11/2015 FINDINGS: Rotation to the LEFT. Enlargement of cardiac silhouette with pulmonary vascular congestion. Mediastinal contours normal. Bibasilar effusions. Atelectasis versus consolidation LEFT lower lobe with minimal atelectasis at RIGHT base. Upper lungs clear. No pneumothorax. Bones unremarkable. IMPRESSION: Enlargement of cardiac silhouette with pulmonary vascular congestion. Bibasilar effusions with minimal RIGHT basilar atelectasis and more pronounced atelectasis versus consolidation in LEFT lower lobe. Electronically Signed   By: Lavonia Dana M.D.   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  Hospital acquired pneumonia: Completed 7 days of ceftazidime on 12/16. She has bilateral pleural effusions. Previously discussed with pulmonology and CT surgery - poor candidate for surgical intervention. -Temp to 100.4 yesterday morning - afebrile since -BCx from 12/17 growing GPC in clusters in 1/2 bottles, likely contaminant but will follow-up speciation -Will try to obtain UCx via cath - she makes very little urine.  -IR consulted for diagnostic thoracentesis for culture, consider therapeutic thoracentesis. -Restart ceftazidime during dialysis on Tuesday. Unable to start earlier 2/2 lack of IV access so was started on oral levofloxacin and augmentin until then. If she worsens clinically, may consider adding vancomcyin.   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 Platelets increased from <5 to 140k. Down to 30k yesterday and 30k today. No clinically significant bleeding currently. -Heme following, appreciate recommendations -NPlate weekly -Dexamethasone 20mg  BID, day 2/4 of 2nd course -Continue Plaquenil 400mg  daily -Will need to discuss with rheumatology possible escalation of therapy for lupus.  Psychosis, Depression: She had acute confusion and hallucinations secondary UTI. Her symptoms improved but worsened 12/9. 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 Low suspicion for Lupus cerebritis. Psychosis resolved. -Continue olanzapine 5mg  BID -Continue duloxetine 40mg  QD  ESRD on HD, s/p Left thigh AVG:AVG placed 01/04/2015. Back on her outpatient TTS schedule. She has some left leg edema likely 2/2 AVG. Venous duplex with post surgical changes and no DVT. -Midodrine 5mg  BID -Nephrology following, appreciate recs -HD per nephrology  Acute on chronic anemia: Hgb stable at 10.8 from 10.9 yesterday. -Continue to monitor.  Transfuse prn for hgb <7.  Hx recent R. MCA Embolic CVA:  -Daily PT/INR -Coumadin held while plt <50.  Deconditioning: She reports she was able to sit in a chair for 4 hours yesterday. -Continue PT  Hx SVT/Tachycardia: Continue Toprol-XL 12.5mg  daily  FEN: Renal VTE ppx: SCDs  FULL CODE  Dispo: Disposition is deferred at this time, awaiting improvement of current medical problems. Anticipated discharge in approximately 1-2 day(s).   The patient does have a current PCP Lin Landsman, MD) and does not need an [facility] hospital follow-up appointment after discharge.  The patient does not have transportation limitations that hinder transportation to clinic appointments   LOS: 26 days   Norval Gable, MD 03/17/2015, 8:37 AM

## 2015-03-17 NOTE — Progress Notes (Signed)
CRITICAL VALUE ALERT  Critical value received:  BC shows Gram positive cocci in clusters  Date of notification:  03/17/15  Time of notification:  [id number]  Critical value read back:Yes.    Nurse who received alert:  Zenon Mayo, RN  MD notified (1st page):  On-call MD [phone]  Time of first page:  [phone]  Responding MD:  Dr. Merrilyn Puma  Time MD responded:  [phone]

## 2015-03-17 NOTE — Procedures (Signed)
Successful US guided right thoracentesis, effusion is loculated. Yielded 60 ml of dark bloody fluid. Pt tolerated procedure well. No immediate complications.  Specimen was sent for labs. CXR ordered.  Tsosie Billing D PA-C 03/17/2015 9:45 AM

## 2015-03-17 NOTE — Progress Notes (Signed)
[facility] Progress Note  Assessment:  1 PNA / pleural effusion - parapneumonic effusions- s/p Fortaz 7 days 2 New fever/ bacteremia - gram+ in 2/2 blood cx's from 12/17 (yesterday); needs IV vanc per ID but no IV access 3 Thrombocytopenia- had resolved  sp IVIG/ thromboplastin now recurring, plts ~30, dexamethasone 20 bid resumed 4 ESRD TTS L thigh graft- no heparin due to low platelets 5 Recent pseudomonas cath sepsis 6 SLE plaquenil 7 AMS/ acute psychosis resolved- on zyprexa/cymbalta 8 SP CVA/ marantic endocarditis/ hypercoag - off coumadin d/t low plts 8 Hypotension on midodrine 5 bid 10 Anemia max esa Q Thursday ; Hgb 10.8 stable.  - has had 4 units total the last two were 12/15 for Hgb of 6.5 - last transferrin < 70 12/5 prior to transfusions; avoiding IV Fe due to infection 11 MBD P 1.4 12/15 - liberalizing diet should help 12 Severe protein calorie malnutrition/ Bcomplex/prostat- reg diet 13 SP Acinetobacter/VRE UTI 11/23  14 Volume ok, down 10kg from prior edw 15 Hypokalemia - resolved 16 Debillity must be able to sit in chair for HD before can be dc'd to SNF  Plan - needs central line for Vanc, will d/w primary further. HD Tuesday.   Kelly Splinter MD [facility] pager [phone]    cell [phone] 03/17/2015, 2:32 PM     Subjective:   No complaints. Blood cx's yest turned + today   Objective Filed Vitals:   03/17/15 0558 03/17/15 0920 03/17/15 0945 03/17/15 1347  BP: 131/79 137/76 124/73 153/84  Pulse: 86   84  Temp: 98.3 F (36.8 C)   98.1 F (36.7 C)  TempSrc: Oral   Oral  Resp: 18   18  Height:      Weight: 78.7 kg (173 lb 8 oz)     SpO2: 98%   100%   Physical Exam 96% sat on room air goal 2 L General: ill appearing Heart: tachy reg Lungs: dim bases Abdomen: soft nt Extremities: muscle wasting/no edema - feet elevated in recliner Dialysis Access: left thigh AVGG   Dialysis Orders: TTS 4 hr 87.5kg 2/2 bath L thigh AVG  Hep 2600 Aranesp 200/week  Additional Objective Labs: Lab Results  Component Value Date   INR 1.67* 03/17/2015   INR 1.41 03/16/2015   INR 1.32 0000000    Basic Metabolic Panel:  Recent Labs Lab 03/12/15 0845  03/14/15 0615 03/16/15 1337 03/17/15 0540  NA 130*  < > 137 135 131*  K 2.9*  < > 4.1 3.4* 4.1  CL 97*  < > 104 101 95*  CO2 26  < > 27 27 29   GLUCOSE 70  < > 69 71 167*  BUN 29*  < > 18 21* 6  CREATININE 4.55*  < > 3.60* 3.87* 2.25*  CALCIUM 8.5*  < > 8.6* 8.7* 8.3*  PHOS 3.1  --  1.4*  --  1.5*  < > = values in this interval not displayed. Liver Function Tests:  Recent Labs Lab 03/11/15 2016  03/12/15 0845 03/14/15 0615 03/17/15 0540  PROT 6.9  --   --   --   --   ALBUMIN  --   < > 1.6* 1.5* 2.0*  < > = values in this interval not displayed. CBC:  Recent Labs Lab 03/13/15 0840 03/14/15 0615 03/15/15 0921 03/16/15 0510 03/17/15 0540  WBC 6.3 7.2 7.0 11.8* 11.4*  HGB 6.5* 6.5* 10.9* 10.8* 9.0*  HCT 21.8* 21.8* 33.2* 33.1* 27.8*  MCV 99.5 99.1 94.1 93.0 94.9  PLT 152 141* 47* 30* 30*   Blood Culture    Component Value Date/Time   SDES FLUID PLEURAL RIGHT 03/17/2015 1027   SPECREQUEST NONE 03/17/2015 1027   CULT GRAM POSITIVE COCCI 03/16/2015 1125   REPTSTATUS 03/17/2015 FINAL 03/17/2015 1027    Studies/Results: Dg Chest 1 View  03/17/2015  CLINICAL DATA:  Status post right-sided thoracentesis EXAM: CHEST  1 VIEW COMPARISON:  03/16/2015 FINDINGS: Cardiac shadow is enlarged. A right-sided thoracentesis has been performed with reduction in right-sided pleural effusion. No pneumothorax is seen. A stable left axillary stent is noted. No bony abnormality is seen. IMPRESSION: No pneumothorax following right thoracentesis. Electronically Signed   By: Inez Catalina M.D.   On: 03/17/2015 10:54   Dg Chest Port 1 View  03/16/2015  CLINICAL DATA:  Fever, lupus, stroke, end-stage renal disease, TIA, hypertension, arrhythmias EXAM: PORTABLE CHEST 1 VIEW  COMPARISON:  Portable exam 1108 hours compared to 03/11/2015 FINDINGS: Rotation to the LEFT. Enlargement of cardiac silhouette with pulmonary vascular congestion. Mediastinal contours normal. Bibasilar effusions. Atelectasis versus consolidation LEFT lower lobe with minimal atelectasis at RIGHT base. Upper lungs clear. No pneumothorax. Bones unremarkable. IMPRESSION: Enlargement of cardiac silhouette with pulmonary vascular congestion. Bibasilar effusions with minimal RIGHT basilar atelectasis and more pronounced atelectasis versus consolidation in LEFT lower lobe. Electronically Signed   By: Lavonia Dana M.D.   On: 03/16/2015 11:29   US Thoracentesis Asp Pleural Space W/img Guide  03/17/2015  INDICATION: Symptomatic right sided pleural effusion EXAM: US THORACENTESIS ASP PLEURAL SPACE W/IMG GUIDE COMPARISON:  Thoracentesis 03/11/2015. MEDICATIONS: None COMPLICATIONS: None immediate TECHNIQUE: Informed written consent was obtained from the patient after a discussion of the risks, benefits and alternatives to treatment. A timeout was performed prior to the initiation of the procedure. Initial ultrasound scanning demonstrates a right loculated pleural effusion. The lower chest was prepped and draped in the usual sterile fashion. 1% lidocaine was used for local anesthesia. Under direct ultrasound guidance, a 19 gauge, 7-cm, Yueh catheter was introduced. An ultrasound image was saved for documentation purposes. The thoracentesis was performed. The catheter was removed and a dressing was applied. The patient tolerated the procedure well without immediate post procedural complication. The patient was escorted to have an upright chest radiograph. FINDINGS: A total of approximately 60 ml of dark bloody fluid was removed. Requested samples were sent to the laboratory. IMPRESSION: Successful ultrasound-guided right sided thoracentesis yielding 60 ml of pleural fluid. Read By:  Tsosie Billing PA-C Electronically Signed   By:  Marybelle Killings M.D.   On: 03/17/2015 10:32   Medications:   . sodium chloride   Intravenous Once  . B-complex with vitamin C  1 tablet Oral Daily  . [START ON 03/19/2015] cefTAZidime (FORTAZ)  IV  2 g Intravenous Q T,Th,Sa-HD  . Darbepoetin Alfa  200 mcg Intravenous Q Thu-HD  . dexamethasone  20 mg Oral Q12H  . DULoxetine  40 mg Oral Daily  . feeding supplement  1 Container Oral BID BM  . feeding supplement (PRO-STAT SUGAR FREE 64)  30 mL Oral Daily  . hydroxychloroquine  400 mg Oral Daily  . lidocaine (PF)      . metoprolol succinate  12.5 mg Oral q1800  . midodrine  5 mg Oral BID WC  . multivitamin  1 tablet Oral BID  . OLANZapine zydis  10 mg Oral BID PC  . pantoprazole  40 mg Oral Daily  . polyethylene glycol  17 g  Oral Daily  . potassium & sodium phosphates  1 packet Oral BID AC  . romiPLOStim  1 mcg/kg Subcutaneous Weekly  . senna-docusate  2 tablet Oral QHS  . sodium chloride  3 mL Intravenous Q12H

## 2015-03-17 NOTE — Progress Notes (Addendum)
ANTIBIOTIC CONSULT NOTE - INITIAL  Pharmacy Consult for Vancomycin Indication: bacteremia  Allergies  Allergen Reactions  . Food Swelling    Red peppers    Patient Measurements: Height: 5\' 3"  (160 cm) Weight: 173 lb 8 oz (78.7 kg) IBW/kg (Calculated) : 52.4  Vital Signs: Temp: 98.1 F (36.7 C) (12/18 1347) Temp Source: Oral (12/18 1347) BP: 153/84 mmHg (12/18 1347) Pulse Rate: 84 (12/18 1347) Intake/Output from previous day: 12/17 0701 - 12/18 0700 In: 600 [P.O.:600] Out: 251  Intake/Output from this shift: Total I/O In: 240 [P.O.:240] Out: 200 [Urine:200]  Labs:  Recent Labs  03/15/15 0921 03/16/15 0510 03/16/15 1337 03/17/15 0540  WBC 7.0 11.8*  --  11.4*  HGB 10.9* 10.8*  --  9.0*  PLT 47* 30*  --  30*  CREATININE  --   --  3.87* 2.25*   Estimated Creatinine Clearance: 33.3 mL/min (by C-G formula based on Cr of 2.25). No results for input(s): VANCOTROUGH, VANCOPEAK, VANCORANDOM, GENTTROUGH, GENTPEAK, GENTRANDOM, TOBRATROUGH, TOBRAPEAK, TOBRARND, AMIKACINPEAK, AMIKACINTROU, AMIKACIN in the last 72 hours.   Microbiology: Recent Results (from the past 720 hour(s))  Culture, blood (routine x 2)     Status: None   Collection Time: 02/19/15  9:50 AM  Result Value Ref Range Status   Specimen Description BLOOD RIGHT ANTECUBITAL  Final   Special Requests BOTTLES DRAWN AEROBIC AND ANAEROBIC 5CCS  Final   Culture NO GROWTH 5 DAYS  Final   Report Status 02/24/2015 FINAL  Final  Culture, blood (routine x 2)     Status: None   Collection Time: 02/19/15 10:00 AM  Result Value Ref Range Status   Specimen Description BLOOD RIGHT HAND  Final   Special Requests BOTTLES DRAWN AEROBIC AND ANAEROBIC 5CCS  Final   Culture NO GROWTH 5 DAYS  Final   Report Status 02/24/2015 FINAL  Final  Urine culture     Status: None   Collection Time: 02/20/15 12:35 PM  Result Value Ref Range Status   Specimen Description URINE, CATHETERIZED  Final   Special Requests NONE  Final    Culture   Final    >=100,000 COLONIES/mL VANCOMYCIN RESISTANT ENTEROCOCCUS >=100,000 COLONIES/mL ACINETOBACTER CALCOACETICUS/BAUMANNII COMPLEX    Report Status 02/24/2015 FINAL  Final   Organism ID, Bacteria ACINETOBACTER CALCOACETICUS/BAUMANNII COMPLEX  Final   Organism ID, Bacteria VANCOMYCIN RESISTANT ENTEROCOCCUS  Final      Susceptibility   Acinetobacter calcoaceticus/baumannii complex - MIC*    CEFTAZIDIME >=64 RESISTANT Resistant     CEFTRIAXONE >=64 RESISTANT Resistant     CIPROFLOXACIN >=4 RESISTANT Resistant     GENTAMICIN 2 SENSITIVE Sensitive     IMIPENEM 2 SENSITIVE Sensitive     PIP/TAZO 32 INTERMEDIATE Intermediate     TRIMETH/SULFA >=320 RESISTANT Resistant     AMPICILLIN/SULBACTAM 4 SENSITIVE Sensitive     * >=100,000 COLONIES/mL ACINETOBACTER CALCOACETICUS/BAUMANNII COMPLEX   Vancomycin resistant enterococcus - MIC*    AMPICILLIN <=2 SENSITIVE Sensitive     LEVOFLOXACIN >=8 RESISTANT Resistant     NITROFURANTOIN <=16 SENSITIVE Sensitive     VANCOMYCIN >=32 RESISTANT Resistant     LINEZOLID 2 SENSITIVE Sensitive     * >=100,000 COLONIES/mL VANCOMYCIN RESISTANT ENTEROCOCCUS  Culture, blood (routine x 2)     Status: None   Collection Time: 02/20/15  3:30 PM  Result Value Ref Range Status   Specimen Description BLOOD VENOUS  Final   Special Requests BOTTLES DRAWN AEROBIC AND ANAEROBIC 10ML  Final   Culture NO GROWTH 5  DAYS  Final   Report Status 02/25/2015 FINAL  Final  Culture, blood (routine x 2)     Status: None   Collection Time: 02/20/15  4:16 PM  Result Value Ref Range Status   Specimen Description BLOOD ARTERIAL LINE  Final   Special Requests BOTTLES DRAWN AEROBIC AND ANAEROBIC 10ML  Final   Culture NO GROWTH 5 DAYS  Final   Report Status 02/25/2015 FINAL  Final  Culture, blood (routine x 2)     Status: None   Collection Time: 02/21/15  8:36 PM  Result Value Ref Range Status   Specimen Description BLOOD RIGHT HAND  Final   Special Requests IN PEDIATRIC  BOTTLE 3CC  Final   Culture NO GROWTH 5 DAYS  Final   Report Status 02/26/2015 FINAL  Final  Culture, blood (routine x 2)     Status: None   Collection Time: 02/21/15  9:21 PM  Result Value Ref Range Status   Specimen Description BLOOD RIGHT THUMB  Final   Special Requests IN PEDIATRIC BOTTLE 1.5CC  Final   Culture NO GROWTH 5 DAYS  Final   Report Status 02/26/2015 FINAL  Final  Culture, blood (routine x 2)     Status: None   Collection Time: 02/27/15  3:41 PM  Result Value Ref Range Status   Specimen Description BLOOD RIGHT HAND  Final   Special Requests IN PEDIATRIC BOTTLE  2CC  Final   Culture NO GROWTH 5 DAYS  Final   Report Status 03/04/2015 FINAL  Final  Culture, blood (routine x 2)     Status: None   Collection Time: 02/27/15  5:10 PM  Result Value Ref Range Status   Specimen Description BLOOD RIGHT HAND  Final   Special Requests IN PEDIATRIC BOTTLE  3CC  Final   Culture NO GROWTH 5 DAYS  Final   Report Status 03/04/2015 FINAL  Final  MRSA PCR Screening     Status: None   Collection Time: 03/04/15  8:02 PM  Result Value Ref Range Status   MRSA by PCR NEGATIVE NEGATIVE Final    Comment:        The GeneXpert MRSA Assay (FDA approved for NASAL specimens only), is one component of a comprehensive MRSA colonization surveillance program. It is not intended to diagnose MRSA infection nor to guide or monitor treatment for MRSA infections.   MRSA PCR Screening     Status: None   Collection Time: 03/09/15  7:48 PM  Result Value Ref Range Status   MRSA by PCR NEGATIVE NEGATIVE Final    Comment:        The GeneXpert MRSA Assay (FDA approved for NASAL specimens only), is one component of a comprehensive MRSA colonization surveillance program. It is not intended to diagnose MRSA infection nor to guide or monitor treatment for MRSA infections.   Culture, body fluid-bottle     Status: None   Collection Time: 03/11/15  2:40 PM  Result Value Ref Range Status   Specimen  Description FLUID LEFT PLEURAL  Final   Special Requests NONE  Final   Culture NO GROWTH 5 DAYS  Final   Report Status 03/16/2015 FINAL  Final  Gram stain     Status: None   Collection Time: 03/11/15  2:40 PM  Result Value Ref Range Status   Specimen Description FLUID LEFT PLEURAL  Final   Special Requests NONE  Final   Gram Stain   Final    MODERATE WBC PRESENT,BOTH PMN AND  MONONUCLEAR NO ORGANISMS SEEN    Report Status 03/11/2015 FINAL  Final  Culture, blood (routine x 2)     Status: None (Preliminary result)   Collection Time: 03/16/15 10:45 AM  Result Value Ref Range Status   Specimen Description BLOOD RIGHT HAND  Final   Special Requests IN PEDIATRIC BOTTLE 0.5CC  Final   Culture  Setup Time   Final    GRAM POSITIVE COCCI IN CLUSTERS AEROBIC BOTTLE ONLY CRITICAL RESULT CALLED TO, READ BACK BY AND VERIFIED WITH: J ZHU 03/17/15 @ 0730 M VESTAL    Culture GRAM POSITIVE COCCI  Final   Report Status PENDING  Incomplete  Culture, blood (routine x 2)     Status: None (Preliminary result)   Collection Time: 03/16/15 11:25 AM  Result Value Ref Range Status   Specimen Description BLOOD RIGHT FOREARM  Final   Special Requests BOTTLES DRAWN AEROBIC AND ANAEROBIC 5CC  Final   Culture  Setup Time   Final    GRAM POSITIVE COCCI IN CLUSTERS ANAEROBIC BOTTLE ONLY CRITICAL RESULT CALLED TO, READ BACK BY AND VERIFIED WITH: S LEE 03/17/15 @ 1053 M VESTAL    Culture GRAM POSITIVE COCCI  Final   Report Status PENDING  Incomplete  Gram stain     Status: None   Collection Time: 03/17/15 10:27 AM  Result Value Ref Range Status   Specimen Description FLUID PLEURAL RIGHT  Final   Special Requests NONE  Final   Gram Stain   Final    RARE WBC PRESENT, PREDOMINANTLY MONONUCLEAR NO ORGANISMS SEEN    Report Status 03/17/2015 FINAL  Final    Medical History: Past Medical History  Diagnosis Date  . Hypertension   . Cardiac arrhythmia   . SVT (supraventricular tachycardia) ([place])     "today,  last week, 2 wk ago; maybe 1 month ago, etc; started w/in last 3-4 yrs"(07/20/2012)  . Fainting     "~ 1 month ago; probably related to SVT" (07/20/2012)  . Shortness of breath     "related to SVT episodes" (07/20/2012)  . Chest pain at rest     "related to SVT" (07/20/2012)  . Lupus (systemic lupus erythematosus) ([place])   . GERD (gastroesophageal reflux disease)   . Fibromyalgia   . Irritable bowel syndrome (IBS)   . Hypercholesterolemia   . Heart murmur     "small" (12/25/2014)  . TIA (transient ischemic attack) 12/21/2014  . Sleep apnea     "don't wear mask anymore" (12/25/2014)  . Anemia   . History of blood transfusion "several"    "related to low counts"  . Daily headache   . Arthritis     "hips" (12/25/2014)  . Anxiety     "sometimes; don't take anything for it" (12/25/2014)  . 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  Repeat biopsy early 2016 membranous w/o activity so meds weaned off. Big flare Aug '16 creat 6, 3rd biopsy DPGN, meds resumed. Ended up starting HD Sept 2016  . Stroke ([place])   . History of vascular access device     2016: Sept 9  R IJ cath per IR.  Sept 20 not candidate for fistula due to disease veins, had LUA hybrid graft placed. Sept 21 - steal syndrome, LUA AVG ligated. Oct 7 - new left femoral Gore-tex loop graft per VVS. Oct 29 - removal R IJ tunneled HD cath  Assessment: 47 YOF with ESRD TTSat. Patient on ceftazidime for potential HCAP. Now with BCx pos 2/2 GPC clusters. Pharmacy consulted to dose vancomycin. Note pt has vascular assess issues WBC 11.4, afeb  Goal of Therapy:  Vancomycin trough level 15-20 mcg/ml  Plan: -Vancomycin LD 1.75G, followed by Vancomycin 750mg  TTSat with HD  -Continue Ceftazidime 2g TThSat with HD -Pharmacy will follow HD schedule for abx dosing   Darl Pikes, PharmD Clinical Pharmacist- Resident Pager: [phone]  Darl Pikes 03/17/2015,3:13 PM

## 2015-03-18 ENCOUNTER — Inpatient Hospital Stay (HOSPITAL_COMMUNITY): Payer: Medicaid Other

## 2015-03-18 DIAGNOSIS — J90 Pleural effusion, not elsewhere classified: Secondary | ICD-10-CM | POA: Insufficient documentation

## 2015-03-18 LAB — PH, BODY FLUID: pH, Body Fluid: 7.5

## 2015-03-18 LAB — CBC
HCT: 32.4 % — ABNORMAL LOW (ref 36.0–46.0)
HEMOGLOBIN: 10.2 g/dL — AB (ref 12.0–15.0)
MCH: 29.7 pg (ref 26.0–34.0)
MCHC: 31.5 g/dL (ref 30.0–36.0)
MCV: 94.5 fL (ref 78.0–100.0)
PLATELETS: 27 10*3/uL — AB (ref 150–400)
RBC: 3.43 MIL/uL — ABNORMAL LOW (ref 3.87–5.11)
RDW: 18 % — AB (ref 11.5–15.5)
WBC: 14.2 10*3/uL — ABNORMAL HIGH (ref 4.0–10.5)

## 2015-03-18 LAB — BODY FLUID CELL COUNT WITH DIFFERENTIAL
Lymphs, Fluid: 10 %
MONOCYTE-MACROPHAGE-SEROUS FLUID: 40 % — AB (ref 50–90)
Neutrophil Count, Fluid: 52 % — ABNORMAL HIGH (ref 0–25)
WBC FLUID: 1900 uL — AB (ref 0–1000)

## 2015-03-18 LAB — PROTIME-INR
INR: 1.74 — ABNORMAL HIGH (ref 0.00–1.49)
PROTHROMBIN TIME: 20.3 s — AB (ref 11.6–15.2)

## 2015-03-18 LAB — PROCALCITONIN: Procalcitonin: 33.97 ng/mL

## 2015-03-18 MED ORDER — SODIUM CHLORIDE 0.9 % IV SOLN
125.0000 mg | INTRAVENOUS | Status: DC
  Administered 2015-03-19 – 2015-03-21 (×2): 125 mg via INTRAVENOUS
  Filled 2015-03-18 (×3): qty 10

## 2015-03-18 NOTE — Progress Notes (Signed)
Subjective: [name] had no acute events overnight. She says she feels well. She denies fevers. She denies shortness of breath during dialysis yesterday but otherwise has not had any SOB or cough. Has not had any episodes of bleeding. Denies any other symptoms at this time. Peripheral IV was placed temporarily in the L arm for a loading dose of vancomycin.  Objective: Vital signs in last 24 hours: Filed Vitals:   03/17/15 1347 03/17/15 2139 03/18/15 0504 03/18/15 0507  BP: 153/84 155/92  148/82  Pulse: 84 84  73  Temp: 98.1 F (36.7 C) 97.7 F (36.5 C)  97.5 F (36.4 C)  TempSrc: Oral Oral  Oral  Resp: 18 16  16   Height:      Weight:   173 lb 15.1 oz (78.9 kg)   SpO2: 100% 100%  100%   Weight change: 7.1 oz (0.2 kg)  Intake/Output Summary (Last 24 hours) at 03/18/15 1028 Last data filed at 03/17/15 1850  Gross per 24 hour  Intake    240 ml  Output    200 ml  Net     40 ml   General Apperance: NAD HEENT: Normocephalic, atraumatic, PERRL, EOMI, anicteric sclera Neck: Supple, trachea midline Lungs: Decreased breath sounds at right lower base. Clear to auscultation otherwise. Heart: Regular rate and rhythm Abdomen: Soft, nontender, nondistended, no rebound/guarding Extremities: Warm and well perfused, 2+ edema of the left thigh around graft site, 1+ distal LLE edema. Bandages around graft do not have any evidence of bleeding. Pulses: 1+ throughout Skin: No rashes or lesions Neurologic: Alert and oriented x 3. No gross deficits. Psych: Flat affect but appropriate.  Lab Results: Basic Metabolic Panel:  Recent Labs Lab 03/14/15 0615 03/16/15 1337 03/17/15 0540  NA 137 135 131*  K 4.1 3.4* 4.1  CL 104 101 95*  CO2 27 27 29   GLUCOSE 69 71 167*  BUN 18 21* 6  CREATININE 3.60* 3.87* 2.25*  CALCIUM 8.6* 8.7* 8.3*  PHOS 1.4*  --  1.5*   Liver Function Tests:  Recent Labs Lab 03/11/15 2016  03/14/15 0615 03/17/15 0540 03/17/15 1413  PROT 6.9  --   --   --   6.7  ALBUMIN  --   < > 1.5* 2.0*  --   < > = values in this interval not displayed. CBC:  Recent Labs Lab 03/17/15 0540 03/18/15 0610  WBC 11.4* 14.2*  HGB 9.0* 10.2*  HCT 27.8* 32.4*  MCV 94.9 94.5  PLT 30* 27*   Coagulation:  Recent Labs Lab 03/15/15 0453 03/16/15 0510 03/17/15 0540 03/18/15 0610  LABPROT 16.5* 17.4* 19.7* 20.3*  INR 1.32 1.41 1.67* 1.74*    Micro Results: Recent Results (from the past 240 hour(s))  MRSA PCR Screening     Status: None   Collection Time: 03/09/15  7:48 PM  Result Value Ref Range Status   MRSA by PCR NEGATIVE NEGATIVE Final    Comment:        The GeneXpert MRSA Assay (FDA approved for NASAL specimens only), is one component of a comprehensive MRSA colonization surveillance program. It is not intended to diagnose MRSA infection nor to guide or monitor treatment for MRSA infections.   Culture, body fluid-bottle     Status: None   Collection Time: 03/11/15  2:40 PM  Result Value Ref Range Status   Specimen Description FLUID LEFT PLEURAL  Final   Special Requests NONE  Final   Culture NO GROWTH 5 DAYS  Final  Report Status 03/16/2015 FINAL  Final  Gram stain     Status: None   Collection Time: 03/11/15  2:40 PM  Result Value Ref Range Status   Specimen Description FLUID LEFT PLEURAL  Final   Special Requests NONE  Final   Gram Stain   Final    MODERATE WBC PRESENT,BOTH PMN AND MONONUCLEAR NO ORGANISMS SEEN    Report Status 03/11/2015 FINAL  Final  Culture, blood (routine x 2)     Status: None (Preliminary result)   Collection Time: 03/16/15 10:45 AM  Result Value Ref Range Status   Specimen Description BLOOD RIGHT HAND  Final   Special Requests IN PEDIATRIC BOTTLE 0.5CC  Final   Culture  Setup Time   Final    GRAM POSITIVE COCCI IN CLUSTERS AEROBIC BOTTLE ONLY CRITICAL RESULT CALLED TO, READ BACK BY AND VERIFIED WITH: J ZHU 03/17/15 @ 0730 M VESTAL    Culture GRAM POSITIVE COCCI  Final   Report Status PENDING   Incomplete  Culture, blood (routine x 2)     Status: None (Preliminary result)   Collection Time: 03/16/15 11:25 AM  Result Value Ref Range Status   Specimen Description BLOOD RIGHT FOREARM  Final   Special Requests BOTTLES DRAWN AEROBIC AND ANAEROBIC 5CC  Final   Culture  Setup Time   Final    GRAM POSITIVE COCCI IN CLUSTERS ANAEROBIC BOTTLE ONLY CRITICAL RESULT CALLED TO, READ BACK BY AND VERIFIED WITH: S LEE 03/17/15 @ 1053 M VESTAL    Culture GRAM POSITIVE COCCI  Final   Report Status PENDING  Incomplete  Gram stain     Status: None   Collection Time: 03/17/15 10:27 AM  Result Value Ref Range Status   Specimen Description FLUID PLEURAL RIGHT  Final   Special Requests NONE  Final   Gram Stain   Final    RARE WBC PRESENT, PREDOMINANTLY MONONUCLEAR NO ORGANISMS SEEN    Report Status 03/17/2015 FINAL  Final   Studies/Results: Dg Chest 1 View  03/17/2015  CLINICAL DATA:  Status post right-sided thoracentesis EXAM: CHEST  1 VIEW COMPARISON:  03/16/2015 FINDINGS: Cardiac shadow is enlarged. A right-sided thoracentesis has been performed with reduction in right-sided pleural effusion. No pneumothorax is seen. A stable left axillary stent is noted. No bony abnormality is seen. IMPRESSION: No pneumothorax following right thoracentesis. Electronically Signed   By: Inez [place] M.D.   On: 03/17/2015 10:54   Dg Chest Port 1 View  03/16/2015  CLINICAL DATA:  Fever, lupus, stroke, end-stage renal disease, TIA, hypertension, arrhythmias EXAM: PORTABLE CHEST 1 VIEW COMPARISON:  Portable exam 1108 hours compared to 03/11/2015 FINDINGS: Rotation to the LEFT. Enlargement of cardiac silhouette with pulmonary vascular congestion. Mediastinal contours normal. Bibasilar effusions. Atelectasis versus consolidation LEFT lower lobe with minimal atelectasis at RIGHT base. Upper lungs clear. No pneumothorax. Bones unremarkable. IMPRESSION: Enlargement of cardiac silhouette with pulmonary vascular congestion.  Bibasilar effusions with minimal RIGHT basilar atelectasis and more pronounced atelectasis versus consolidation in LEFT lower lobe. Electronically Signed   By: Lavonia Dana M.D.   On: 03/16/2015 11:29   US Thoracentesis Asp Pleural Space W/img Guide  03/17/2015  INDICATION: Symptomatic right sided pleural effusion EXAM: US THORACENTESIS ASP PLEURAL SPACE W/IMG GUIDE COMPARISON:  Thoracentesis 03/11/2015. MEDICATIONS: None COMPLICATIONS: None immediate TECHNIQUE: Informed written consent was obtained from the patient after a discussion of the risks, benefits and alternatives to treatment. A timeout was performed prior to the initiation of the procedure. Initial ultrasound scanning  demonstrates a right loculated pleural effusion. The lower chest was prepped and draped in the usual sterile fashion. 1% lidocaine was used for local anesthesia. Under direct ultrasound guidance, a 19 gauge, 7-cm, Yueh catheter was introduced. An ultrasound image was saved for documentation purposes. The thoracentesis was performed. The catheter was removed and a dressing was applied. The patient tolerated the procedure well without immediate post procedural complication. The patient was escorted to have an upright chest radiograph. FINDINGS: A total of approximately 60 ml of dark bloody fluid was removed. Requested samples were sent to the laboratory. IMPRESSION: Successful ultrasound-guided right sided thoracentesis yielding 60 ml of pleural fluid. Read By:  Tsosie Billing PA-C Electronically Signed   By: Marybelle Killings M.D.   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  Hospital acquired pneumonia with likely  parapneumonic effusions: Completed 7 days of ceftazidime on 12/16. She has bilateral pleural effusions. Previously discussed with pulmonology and CT surgery - poor candidate for surgical intervention. -BCx from 12/17 growing GPC in clusters in 2/2 bottles, initially 1/2. Possible sources are HAP/parapneumonic effusion vs urinary tract -F/u UCx -Loading dose of vancomycin given through temporary IV in L arm 2/2 access issues. Will continue vancomycin and ceftazidime with HD on T-T-Sa thereafter -Obtain repeat BCx to document clearance -Re-consult pulmonology +/- CVTS regarding pleural effusions as R-sided is loculated with LDH 1600. Will f/u cell count and other studies. Likely parapneumonic and is a possible source of her bacteremia.  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 Platelets increased from <5 to 140k. Stable at ~30K x 3 days. No clinically significant bleeding currently. -Heme following, appreciate recommendations -NPlate weekly -Dexamethasone 20mg  BID, day 3/4 of 2nd course -Continue Plaquenil 400mg  daily -Will need to discuss with rheumatology possible escalation of therapy for lupus.  Psychosis, Depression: She had acute confusion and hallucinations secondary UTI. Her symptoms improved but worsened 12/9. 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 Low suspicion for Lupus cerebritis. Psychosis resolved. -Continue olanzapine 5mg  BID -Continue duloxetine 40mg  QD  ESRD on HD, s/p Left thigh AVG:AVG placed 01/04/2015. Back on her outpatient TTS schedule. She has some left leg edema likely 2/2 AVG. Venous duplex with post surgical changes and no DVT. -Midodrine 5mg  BID -Nephrology following, appreciate recs -HD per nephrology  Acute on chronic anemia: Hgb stable at 10.8 from 10.9 yesterday. -Continue to monitor. Transfuse prn for hgb <7.  Hx recent R. MCA Embolic CVA:  -Daily  PT/INR -Coumadin held while plt <50.  Deconditioning: She reports she was able to sit in a chair for 4 hours yesterday. -Continue PT  Hx SVT/Tachycardia: Continue Toprol-XL 12.5mg  daily  FEN: Renal VTE ppx: SCDs  FULL CODE  Dispo: Disposition is deferred at this time, awaiting improvement of current medical problems.   The patient does have a current PCP Lin Landsman, MD) and does not need an [facility] hospital follow-up appointment after discharge.  The patient does not have transportation limitations that hinder transportation to clinic appointments   LOS: 27 days   Norval Gable, MD 03/18/2015, 10:28 AM

## 2015-03-18 NOTE — Progress Notes (Signed)
Lab called RN informed RN of critical platelet count of 27. MD made aware of critical lab value and RN asked for clarification for IR whether pt needs a IJ or a PICC line placed for IV ABX. MD stated that he will call RN back with Clarification once he speaks with his attending. Will continue to monitor pt at this time.

## 2015-03-18 NOTE — Progress Notes (Signed)
Subjective: Interval History: has complaints fluid in lungs.  Objective: Vital signs in last 24 hours: Temp:  [97.5 F (36.4 C)-98.1 F (36.7 C)] 97.5 F (36.4 C) (12/19 0507) Pulse Rate:  [73-84] 73 (12/19 0507) Resp:  [16-18] 16 (12/19 0507) BP: (124-155)/(73-92) 148/82 mmHg (12/19 0507) SpO2:  [100 %] 100 % (12/19 0507) Weight:  [78.9 kg (173 lb 15.1 oz)] 78.9 kg (173 lb 15.1 oz) (12/19 0504) Weight change: 0.2 kg (7.1 oz)  Intake/Output from previous day: 12/18 0701 - 12/19 0700 In: 360 [P.O.:360] Out: 200 [Urine:200] Intake/Output this shift:    General appearance: cooperative, moderately obese and slowed mentation Resp: diminished breath sounds bilaterally and rales bibasilar Cardio: S1, S2 normal and systolic murmur: systolic ejection 2/6, decrescendo at 2nd left intercostal space GI: obese, pos bs, liver down 6 cm Extremities: edema 2+ and AVG L groin  Lab Results:  Recent Labs  03/17/15 0540 03/18/15 0610  WBC 11.4* 14.2*  HGB 9.0* 10.2*  HCT 27.8* 32.4*  PLT 30* 27*   BMET:  Recent Labs  03/16/15 1337 03/17/15 0540  NA 135 131*  K 3.4* 4.1  CL 101 95*  CO2 27 29  GLUCOSE 71 167*  BUN 21* 6  CREATININE 3.87* 2.25*  CALCIUM 8.7* 8.3*   No results for input(s): PTH in the last 72 hours. Iron Studies: No results for input(s): IRON, TIBC, TRANSFERRIN, FERRITIN in the last 72 hours.  Studies/Results: Dg Chest 1 View  03/17/2015  CLINICAL DATA:  Status post right-sided thoracentesis EXAM: CHEST  1 VIEW COMPARISON:  03/16/2015 FINDINGS: Cardiac shadow is enlarged. A right-sided thoracentesis has been performed with reduction in right-sided pleural effusion. No pneumothorax is seen. A stable left axillary stent is noted. No bony abnormality is seen. IMPRESSION: No pneumothorax following right thoracentesis. Electronically Signed   By: Inez Catalina M.D.   On: 03/17/2015 10:54   Dg Chest Port 1 View  03/16/2015  CLINICAL DATA:  Fever, lupus, stroke,  end-stage renal disease, TIA, hypertension, arrhythmias EXAM: PORTABLE CHEST 1 VIEW COMPARISON:  Portable exam 1108 hours compared to 03/11/2015 FINDINGS: Rotation to the LEFT. Enlargement of cardiac silhouette with pulmonary vascular congestion. Mediastinal contours normal. Bibasilar effusions. Atelectasis versus consolidation LEFT lower lobe with minimal atelectasis at RIGHT base. Upper lungs clear. No pneumothorax. Bones unremarkable. IMPRESSION: Enlargement of cardiac silhouette with pulmonary vascular congestion. Bibasilar effusions with minimal RIGHT basilar atelectasis and more pronounced atelectasis versus consolidation in LEFT lower lobe. Electronically Signed   By: Lavonia Dana M.D.   On: 03/16/2015 11:29   US Thoracentesis Asp Pleural Space W/img Guide  03/17/2015  INDICATION: Symptomatic right sided pleural effusion EXAM: US THORACENTESIS ASP PLEURAL SPACE W/IMG GUIDE COMPARISON:  Thoracentesis 03/11/2015. MEDICATIONS: None COMPLICATIONS: None immediate TECHNIQUE: Informed written consent was obtained from the patient after a discussion of the risks, benefits and alternatives to treatment. A timeout was performed prior to the initiation of the procedure. Initial ultrasound scanning demonstrates a right loculated pleural effusion. The lower chest was prepped and draped in the usual sterile fashion. 1% lidocaine was used for local anesthesia. Under direct ultrasound guidance, a 19 gauge, 7-cm, Yueh catheter was introduced. An ultrasound image was saved for documentation purposes. The thoracentesis was performed. The catheter was removed and a dressing was applied. The patient tolerated the procedure well without immediate post procedural complication. The patient was escorted to have an upright chest radiograph. FINDINGS: A total of approximately 60 ml of dark bloody fluid was removed. Requested samples  were sent to the laboratory. IMPRESSION: Successful ultrasound-guided right sided thoracentesis  yielding 60 ml of pleural fluid. Read By:  Tsosie Billing PA-C Electronically Signed   By: Marybelle Killings M.D.   On: 03/17/2015 10:32    I have reviewed the patient's current medications.  Assessment/Plan: 1 ESRD vol xs. Lower at HD 2 Anemia add Fe to ESA 3 G Pos sepsis, ? Decub 4 effusions vol xs 5 UTI resolved 6 obesity 7 debill severe 8 SLE 9 [place] endocarditis 10 HPTH 11 psychosis stable on meds P HD, lower vol , Fe, AB,  rehab   LOS: 27 days   Jeralynn Vaquera L 03/18/2015,9:21 AM

## 2015-03-18 NOTE — Progress Notes (Signed)
PT Cancellation Note  Patient Details Name: [name] MRN: [id number] DOB: April 30, 1975   Cancelled Treatment:    Reason Eval/Treat Not Completed: Patient at procedure or test/unavailable. Pt having lab draws. Will check back later.   Luisana Lutzke 03/18/2015, 12:46 PM

## 2015-03-18 NOTE — Progress Notes (Signed)
MD Merrilyn Puma called back stated that there is no need for Central line at this time. IV ABX will be administered with dialysis and that pt has a working Peripheral IV at this time. RN will continue to monitor pt at this time. Pt is in bed. Bed low and locked. Call bell within reach. Bed alarm on.

## 2015-03-18 NOTE — Progress Notes (Signed)
Physical Therapy Treatment Patient Details Name: [name] MRN: [id number] DOB: 18-Mar-1976 Today's Date: 03/18/2015    History of Present Illness Pt is a 39 y.o. F from [place] who has had several recent hospitalizations.  She had a Rt MCA infarction and developed pseudomonal bacteremia from Rt femoral HD line which was treated.  Was doing well in CIR until she acutely developed severe confusion and hallucinations following HD sessions, likely a result of sepsis from Lt ant thigh abscess. Pt's additional PMH includes lupus, fibromyalgia, anemia.    PT Comments    Pt with improved strength and activity tolerance today.  Follow Up Recommendations  SNF     Equipment Recommendations  Other (comment) (Has received w/c and cushion)    Recommendations for Other Services OT consult     Precautions / Restrictions Precautions Precautions: Fall Restrictions Weight Bearing Restrictions: No    Mobility  Bed Mobility Overal bed mobility: Needs Assistance Bed Mobility: Rolling;Sidelying to Sit;Sit to Sidelying Rolling: Min guard;Min assist Sidelying to sit: Mod assist     Sit to sidelying: Min assist General bed mobility comments: Able to roll to rt with min guard and lt min A. Assist to bring legs over and elevate trunk into sitting. assist to bring legs back up into bed.  Transfers                    Ambulation/Gait                 Stairs            Wheelchair Mobility    Modified Rankin (Stroke Patients Only)       Balance Overall balance assessment: Needs assistance Sitting-balance support: No upper extremity supported;Feet supported Sitting balance-Leahy Scale: Fair Sitting balance - Comments: Pt sat EOB x 15 minutes with supervision while performing exercises including trunk stability and those listed below.                            Cognition Arousal/Alertness: Awake/alert Behavior During Therapy: Anxious Overall Cognitive  Status: Within Functional Limits for tasks assessed                      Exercises General Exercises - Lower Extremity Short Arc Quad: AROM;AAROM;Both;10 reps;Supine Heel Slides: AROM;AAROM;Both;10 reps;Supine Hip ABduction/ADduction: AROM;AAROM;Both;10 reps;Supine Straight Leg Raises: AAROM;Right;10 reps;Supine Hip Flexion/Marching: AAROM;Both;10 reps;Seated Other Exercises Other Exercises: Shoulder flex x 10 bil with AAROM    General Comments        Pertinent Vitals/Pain Pain Assessment: Faces Faces Pain Scale: Hurts a little bit Pain Location: buttock Pain Intervention(s): Limited activity within patient's tolerance;Repositioned    Home Living                      Prior Function            PT Goals (current goals can now be found in the care plan section) Progress towards PT goals: Progressing toward goals    Frequency  Min 3X/week    PT Plan Current plan remains appropriate    Co-evaluation             End of Session Equipment Utilized During Treatment:  (maxisky) Activity Tolerance: Patient tolerated treatment well Patient left: with call bell/phone within reach;in bed;with bed alarm set     Time: 1530-1556 PT Time Calculation (min) (ACUTE ONLY): 26 min  Charges:  $Therapeutic Exercise: 8-22  mins $Therapeutic Activity: 8-22 mins                    G Codes:      [name] 04-09-15, 4:12 PM [place]

## 2015-03-18 NOTE — Consult Note (Signed)
Name: [name] MRN: [id number] DOB: 1975-06-16    ADMISSION DATE:  02/19/2015 CONSULTATION DATE:  03/21/2015  REFERRING MD :  Graciella Freer   CHIEF COMPLAINT:  Bilateral effusions  BRIEF PATIENT DESCRIPTION: 39 year old female admitted to [facility] 11/22 from CIR with altered mental status. This was thought to be secondary to sepsis from VRE, acinetobacter UTI. She has now developed abnormal chest x-ray with bilateral pleural effusions. PCCM consulted for further evaluation of effusions. Had left thora 12/13 by IR (culture neg), right thora 12/18 by IR (luculated with 900cc dark bloody fluid; exudative with LDH 1634).  --------------------------------------------------------------------------------------------------------------- Called back 12/19 for further recs.  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  B/l atx. Nodular pleural thickening in right lower lobe - Pleural disease or metastatic disease cannot be excluded. There is nodular mass or adenopathy in right upper lobe medially measures at least 3.7 cm. CXR 12/17 > enlarged cardiac silhouette. Bibasilar effusions --------------------------------------------------------------------------------------------------------------- CT chest 12/19 >   HISTORY OF PRESENT ILLNESS:  39 year old female with past medical history as below, which is significant for SLE, recent MCA stroke, mitral valve vegetation, bacteremia, end-stage renal disease on dialysis, and sleep apnea. Had a recent right MCA infarct thought to be secondary to non-infectious endocarditis  as TEE showed vegetation, but blood cultures were negative. She was treated with long duration antibiotics and anticoagulation. After discharge from that admission she again presented with low-grade fever and blood cultures were positive for pansensitive pseudomonas which was thought to be secondary to femoral HD access catheter. She was treated with a month of [place]. She developed further left-sided weakness and MRI of the brain revealed expansion of her anterior right MCA territory infarct. She was discharged to [facility] where she was doing well until 11/21 when she developed acute alteration in mental status and hallucinations following HD session. This was thought to be secondary to sepsis with unknown source. She was admitted to [facility]. Workup revealed UTI with acinetobacter and VRE. She has been treated with Unasyn. Hospital course complicated by psychotic symptoms of hallucinations. L leg edema (negative for clot), and depression. She is also on IVIG with romiplastim support for steroid-refractory SLE-induced thrombocytopenia. She developed cough and PNA was made a concern. CT was ordered which showed consolidative process and bilateral effusion. She underwent thoracentesis for this 12/11 which appears to be an exudate, cultures negative. PCCM consulted for further evaluation of effusions. ------------------------------------------------------------------------------------------------------------------------------------------ She had right sided thora 12/18 which yielded 900cc dark bloody fluid.  LDH returned high at 1634.  Additional studies including cultures still pending (no cytology sent). PCCM was re-consulted 12/19 for further recs.  Blood cultures 12/17 positive for GPC's in 1/2 bottles.   PAST MEDICAL HISTORY :   has a past medical history of Hypertension; Cardiac arrhythmia; SVT (supraventricular tachycardia) ([place]); Fainting; Shortness of breath; Chest pain at rest; Lupus (systemic lupus  erythematosus) ([place]); GERD (gastroesophageal reflux disease); Fibromyalgia; Irritable bowel syndrome (IBS); Hypercholesterolemia; Heart murmur; TIA (transient ischemic attack) (12/21/2014); Sleep apnea; Anemia; History of blood transfusion ("several"); Daily headache; Arthritis; Anxiety; ESRD (end stage renal disease) on dialysis ([place]) (since 12/07/2014); Stroke [facility]); and History of vascular  access device.  has past surgical history that includes Cesarean section (2001; 2010); Cervical biopsy (2013); Supraventricular tachycardia ablation (07/21/12 ); Cardiac catheterization (N/A, 12/14/2014); Insertion hybrid anteriovenous gortex graft (Left, 12/18/2014); Ligation of arteriovenous  fistula (Left, 12/19/2014); Appendectomy (2011); Tubal ligation (2010); TEE without cardioversion (N/A, 12/25/2014); AV fistula placement (Left, 01/04/2015); and TEE without cardioversion (N/A, 01/30/2015). Prior to Admission medications   Medication Sig Start Date End Date Taking? Authorizing Provider  atorvastatin (LIPITOR) 20 MG tablet Take 1 tablet (20 mg total) by mouth daily at 6 PM. 01/17/15   Lavon Paganini Angiulli, PA-C  Calcium Carbonate Antacid (TUMS PO) Take 1-2 tablets by mouth daily as needed (heartburn).    Historical Provider, MD  cefTAZidime 2 g in dextrose 5 % 50 mL Inject 2 g into the vein Every Tuesday,Thursday,and Saturday with dialysis. 02/04/15   Collier Salina, MD  cyclobenzaprine (FLEXERIL) 10 MG tablet Take 1 tablet (10 mg total) by mouth at bedtime as needed for muscle spasms. 01/17/15   Lavon Paganini Angiulli, PA-C  Darbepoetin Alfa (ARANESP) 200 MCG/0.4ML SOSY injection Inject 0.4 mLs (200 mcg total) into the vein every Thursday with hemodialysis. 02/04/15   Collier Salina, MD  hydroxychloroquine (PLAQUENIL) 200 MG tablet Take 2 tablets (400 mg total) by mouth daily. 01/17/15   Lavon Paganini Angiulli, PA-C  metoprolol succinate (TOPROL-XL) 25 MG 24 hr tablet Take 1 tablet (25 mg total) by mouth at bedtime. 01/17/15    Lavon Paganini Angiulli, PA-C  midodrine (PROAMATINE) 10 MG tablet Take 1 tablet (10 mg total) by mouth 2 (two) times daily with a meal. 02/04/15   Collier Salina, MD  multivitamin (RENA-VIT) TABS tablet Take 1 tablet by mouth at bedtime. 01/17/15   Lavon Paganini Angiulli, PA-C  oxyCODONE-acetaminophen (PERCOCET) 5-325 MG tablet Take 1 tablet by mouth every 6 (six) hours as needed for severe pain. 01/17/15   Lavon Paganini Angiulli, PA-C  pantoprazole (PROTONIX) 20 MG tablet Take 1 tablet (20 mg total) by mouth daily. 01/17/15   Lavon Paganini Angiulli, PA-C  polyethylene glycol (MIRALAX / GLYCOLAX) packet Take 17 g by mouth daily as needed for mild constipation. 02/04/15   Collier Salina, MD  trimethobenzamide (TIGAN) 300 MG capsule Take 1 capsule (300 mg total) by mouth every 8 (eight) hours as needed for nausea/vomiting. 02/04/15   Collier Salina, MD   Allergies  Allergen Reactions  . Food Swelling    Red peppers    FAMILY HISTORY:  family history includes BRCA 1/2 in her sister; Cancer in her mother. SOCIAL HISTORY:  reports that she has never smoked. She has never used smokeless tobacco. She reports that she does not drink alcohol or use illicit drugs.  REVIEW OF SYSTEMS:   All negative; except for those that are bolded, which indicate positives.  Constitutional: weight loss, weight gain, night sweats, fevers, chills, fatigue, weakness.  HEENT: headaches, sore throat, sneezing, nasal congestion, post nasal drip, difficulty swallowing, tooth/dental problems, visual complaints, visual changes, ear aches. Neuro: difficulty with speech, weakness, numbness, ataxia. CV:  chest pain, orthopnea, PND, swelling in lower extremities, dizziness, palpitations, syncope.  Resp: cough, hemoptysis, dyspnea, wheezing. GI  heartburn, indigestion, abdominal pain, nausea, vomiting, diarrhea, constipation, change in bowel habits, loss of appetite, hematemesis, melena, hematochezia.  GU: dysuria, change in color of  urine, urgency or frequency, flank pain, hematuria. MSK: joint pain or swelling, decreased range of motion. Psych: change in mood or affect, depression, anxiety, suicidal ideations, homicidal ideations. Skin: rash, itching, bruising.  SUBJECTIVE: Denies chest pain, SOB, fevers/chills/sweats.  Feels fatigued.  VITAL SIGNS: Temp:  [97.5 F (36.4 C)-98.1 F (36.7 C)] 97.5 F (36.4 C) (12/19 0507) Pulse Rate:  [73-84] 73 (12/19 0507) Resp:  [16-18] 16 (12/19 0507) BP: (148-155)/(82-92) 148/82 mmHg (12/19 0507) SpO2:  [100 %] 100 % (12/19 0507) Weight:  [78.9 kg (173 lb 15.1 oz)] 78.9 kg (173 lb 15.1 oz) (12/19 0504)  PHYSICAL EXAMINATION: General: Pleasant adult AA female, in NAD. Neuro: A&O x 3, non-focal.  HEENT: [place]/AT. PERRL, sclerae anicteric. Cardiovascular: RRR, no M/R/G.  Lungs: Respirations even and unlabored.  Diminished bilateral bases; otherwise clear. Abdomen: BS x 4, soft, NT/ND.  Musculoskeletal: No gross deformities, no edema.  Skin: Intact, warm, no rashes.     Recent Labs Lab 03/14/15 0615 03/16/15 1337 03/17/15 0540  NA 137 135 131*  K 4.1 3.4* 4.1  CL 104 101 95*  CO2 _0 BUN 18 21* 6  CREATININE 3.60* 3.87* 2.25*  GLUCOSE 69 71 167*    Recent Labs Lab 03/16/15 0510 03/17/15 0540 03/18/15 0610  HGB 10.8* 9.0* 10.2*  HCT 33.1* 27.8* 32.4*  WBC 11.8* 11.4* 14.2*  PLT 30* 30* 27*   Dg Chest 1 View  03/17/2015  CLINICAL DATA:  Status post right-sided thoracentesis EXAM: CHEST  1 VIEW COMPARISON:  03/16/2015 FINDINGS: Cardiac shadow is enlarged. A right-sided thoracentesis has been performed with reduction in right-sided pleural effusion. No pneumothorax is seen. A stable left axillary stent is noted. No bony abnormality is seen. IMPRESSION: No pneumothorax following right thoracentesis. Electronically Signed   By: Inez Catalina M.D.   On: 03/17/2015 10:54   Dg Chest Port 1 View  03/16/2015  CLINICAL DATA:  Fever, lupus, stroke,  end-stage renal disease, TIA, hypertension, arrhythmias EXAM: PORTABLE CHEST 1 VIEW COMPARISON:  Portable exam 1108 hours compared to 03/11/2015 FINDINGS: Rotation to the LEFT. Enlargement of cardiac silhouette with pulmonary vascular congestion. Mediastinal contours normal. Bibasilar effusions. Atelectasis versus consolidation LEFT lower lobe with minimal atelectasis at RIGHT base. Upper lungs clear. No pneumothorax. Bones unremarkable. IMPRESSION: Enlargement of cardiac silhouette with pulmonary vascular congestion. Bibasilar effusions with minimal RIGHT basilar atelectasis and more pronounced atelectasis versus consolidation in LEFT lower lobe. Electronically Signed   By: Lavonia Dana M.D.   On: 03/16/2015 11:29   US Thoracentesis Asp Pleural Space W/img Guide  03/17/2015  INDICATION: Symptomatic right sided pleural effusion EXAM: US THORACENTESIS ASP PLEURAL SPACE W/IMG GUIDE COMPARISON:  Thoracentesis 03/11/2015. MEDICATIONS: None COMPLICATIONS: None immediate TECHNIQUE: Informed written consent was obtained from the patient after a discussion of the risks, benefits and alternatives to treatment. A timeout was performed prior to the initiation of the procedure. Initial ultrasound scanning demonstrates a right loculated pleural effusion. The lower chest was prepped and draped in the usual sterile fashion. 1% lidocaine was used for local anesthesia. Under direct ultrasound guidance, a 19 gauge, 7-cm, Yueh catheter was introduced. An ultrasound image was saved for documentation purposes. The thoracentesis was performed. The catheter was removed and a dressing was applied. The patient tolerated the procedure well without immediate post procedural complication. The patient was escorted to have an upright chest radiograph. FINDINGS: A total of approximately 60 ml of dark bloody fluid was removed. Requested samples were sent to the laboratory. IMPRESSION: Successful ultrasound-guided right sided thoracentesis  yielding 60 ml of pleural fluid. Read By:  Tsosie Billing PA-C Electronically Signed   By: Marybelle Killings M.D.   On: 03/17/2015 10:32  ASSESSMENT / PLAN:  Bilateral pleural effusions - s/p left thora 12/11 with 300cc bloody fluid, right thora 12/18 with 900cc bloody fluid, exudative and loculated per IR report.   Suspect parapneumonic. Suspected loculated right effusion - as above. Plan: Repeat CT chest today to quantify effusion.  If small, would recommend continuing / prolonged abx therapy.  If mod - large, would require chest tube drainage (would recommend guided placement via IR given that she is at increased risk of bleeding with thrombocytopenia, etc). Seen by CVTS previously and deemed high risk surgical candidate.  She may however ultimately require VATS if she has significant debris / complicated effusion.  HCAP with probable parapneumonic effusion - 1/2 BC's with GPC's.  Of note, she is immunosuppresed (on plaquenil). Plan: Continue empiric abx (vanc / ceftaz). Follow PCT.  ? Pulmonary mass - question whether these could represent loculations.  No cytology sent with pleural fluid studies from right sided thora. Plan: Add on cytology analysis of right sided pleural fluid. Compare new CT (ordered for today) to prior CT (which was done prior to right sided thora) to evaluate for changes. Pending above, may require biopsy - ? CT guided vs VATS.   Rest per primary team.   Montey Hora, PA - [facility] Pager: [phone]  or [phone] 03/18/2015, 11:29 AM

## 2015-03-18 NOTE — Progress Notes (Signed)
SLP Cancellation Note  Patient Details Name: [name] MRN: [id number] DOB: 05/20/1975   Cancelled treatment:       Reason Eval/Treat Not Completed: Other (comment) Orders received for swallow evaluation - discussed with MD. Since pt has had a recent swallow evaluation and there are not concerns for a change in swallowing function, SLP to hold reassessment at this time. Will sign off - please reorder [place] as needed.   Germain Osgood, M.A. CCC-SLP [phone]  Germain Osgood 03/18/2015, 2:25 PM

## 2015-03-18 NOTE — NC FL2 (Signed)
[place] LEVEL OF CARE SCREENING TOOL     IDENTIFICATION  Patient Name: [name] Birthdate: [birth year] Sex: female Admission Date (Current Location): 02/19/2015  [facility] and [place] Number:  Herbalist and Address:  [facility] [place], [place] [address], [place], [place] [address]      Provider Number: [id number]  Attending Physician Name and Address:  Axel Filler, MD  Relative Name and Phone Number:  Tonette Bihari, [phone]    Current Level of Care: Hospital Recommended Level of Care: [place] Prior Approval Number:    Date Approved/Denied:   PASRR Number: [id number]  Discharge Plan: SNF    Current Diagnoses: Patient Active Problem List   Diagnosis Date Noted  . Pleural effusion, bilateral   . HAP (hospital-acquired pneumonia)   . Thrombocytopenia ([place])   . Acute blood loss anemia   . Encephalopathy   . Psychosis 03/01/2015  . Pressure ulcer 02/28/2015  . Pain and swelling of left lower leg   . Knee pain   . Sepsis secondary to UTI ([place])   . Lower abdominal pain   . Acute encephalopathy 02/19/2015  . 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 Left hemiparesis ([place])   . 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 ESRD (end stage renal disease) on dialysis ([place])   . Lupus nephritis ([place])     Orientation RESPIRATION BLADDER Height & Weight    Self, Time, Situation, Place  Normal Continent ([place])   173 lbs.  BEHAVIORAL SYMPTOMS/MOOD NEUROLOGICAL BOWEL NUTRITION STATUS      Continent    AMBULATORY STATUS COMMUNICATION OF NEEDS Skin   Extensive Assist Verbally PU Stage and Appropriate Care   PU Stage 2 Dressing:  (On Buttocks; Dressing every 5 days)                   Personal Care Assistance Level of Assistance  Bathing, Feeding, Dressing Bathing Assistance: Maximum assistance Feeding assistance: Limited  assistance Dressing Assistance: Maximum assistance     Functional Limitations Info             SPECIAL CARE FACTORS FREQUENCY                       Contractures      Additional Factors Info  Code Status, Allergies, Isolation Precautions Code Status Info: Full Allergies Info: Food     Isolation Precautions Info: VRE; MDRO; Contact precautions     Current Medications (03/18/2015):  This is the current hospital active medication list Current Facility-Administered Medications  Medication Dose Route Frequency Provider Last Rate Last Dose  . 0.9 %  sodium chloride infusion   Intravenous Once Roney Jaffe, MD      . acetaminophen (TYLENOL) suppository 650 mg  650 mg Rectal Q4H PRN Dellia Nims, MD   650 mg at 02/22/15 1858  . acetaminophen (TYLENOL) tablet 650 mg  650 mg Oral Q4H PRN Loleta Chance, MD   650 mg at 03/16/15 1133  . [START ON 03/19/2015] cefTAZidime (FORTAZ) 2 g in dextrose 5 % 50 mL IVPB  2 g Intravenous Q T,Th,Sa-HD Darl Pikes, RPH      . Darbepoetin Alfa (ARANESP) injection 200 mcg  200 mcg Intravenous Q Thu-HD Ernest Haber, PA-C   200 mcg at 03/14/15 1102  . dexamethasone (DECADRON) tablet 20 mg  20 mg Oral Q12H Milagros Loll,  MD   20 mg at 03/18/15 0930  . DULoxetine (CYMBALTA) DR capsule 40 mg  40 mg Oral Daily Ambrose Finland, MD   40 mg at 03/17/15 1221  . feeding supplement (BOOST / RESOURCE BREEZE) liquid 1 Container  1 Container Oral BID BM Domenick Bookbinder, RD   Stopped at 03/14/15 1000  . feeding supplement (PRO-STAT SUGAR FREE 64) liquid 30 mL  30 mL Oral Daily Domenick Bookbinder, RD   Stopped at 03/14/15 1000  . [START ON 03/19/2015] ferric gluconate (NULECIT) 125 mg in sodium chloride 0.9 % 100 mL IVPB  125 mg Intravenous Q T,Th,Sa-HD Mauricia Area, MD      . guaiFENesin-dextromethorphan (ROBITUSSIN DM) 100-10 MG/5ML syrup 5 mL  5 mL Oral Q4H PRN Burgess Estelle, MD   5 mL at 03/17/15 0852  . hydroxychloroquine (PLAQUENIL)  tablet 400 mg  400 mg Oral Daily Collier Salina, MD   400 mg at 03/18/15 0925  . Influenza vac split quadrivalent PF (FLUARIX) injection 0.5 mL  0.5 mL Intramuscular Prior to discharge Axel Filler, MD      . metoprolol succinate (TOPROL-XL) 24 hr tablet 12.5 mg  12.5 mg Oral q1800 Milagros Loll, MD   12.5 mg at 03/17/15 1728  . midodrine (PROAMATINE) tablet 5 mg  5 mg Oral BID WC Roney Jaffe, MD   5 mg at 03/18/15 0926  . multivitamin (RENA-VIT) tablet 1 tablet  1 tablet Oral BID Roney Jaffe, MD   1 tablet at 03/18/15 [phone]  . OLANZapine zydis (ZYPREXA) disintegrating tablet 10 mg  10 mg Oral BID PC Ambrose Finland, MD   10 mg at 03/18/15 0926  . ondansetron (ZOFRAN) tablet 4 mg  4 mg Oral Q6H PRN Corky Sox, MD   4 mg at 03/17/15 1105   Or  . ondansetron [facility]) injection 4 mg  4 mg Intravenous Q6H PRN Corky Sox, MD   4 mg at 03/14/15 1057  . oxyCODONE-acetaminophen (PERCOCET/ROXICET) 5-325 MG per tablet 1 tablet  1 tablet Oral Q6H PRN Milagros Loll, MD   1 tablet at 03/16/15 2220  . pantoprazole (PROTONIX) EC tablet 40 mg  40 mg Oral Daily Jaquita Folds, RPH   40 mg at 03/17/15 1218  . pneumococcal 23 valent vaccine (PNU-IMMUNE) injection 0.5 mL  0.5 mL Intramuscular Prior to discharge Axel Filler, MD      . polyethylene glycol (MIRALAX / GLYCOLAX) packet 17 g  17 g Oral Daily Ejiroghene E Denton Brick, MD   17 g at 03/17/15 1220  . potassium & sodium phosphates (PHOS-NAK) 280-160-250 MG packet 1 packet  1 packet Oral BID AC Roney Jaffe, MD   1 packet at 03/17/15 1219  . romiPLOStim (NPLATE) injection 75 mcg  1 mcg/kg Subcutaneous Weekly Chauncey Cruel, MD   75 mcg at 03/16/15 1840  . senna-docusate (Senokot-S) tablet 2 tablet  2 tablet Oral QHS Bethena Roys, MD   2 tablet at 03/17/15 2104  . sodium chloride (OCEAN) 0.65 % nasal spray 1 spray  1 spray Each Nare PRN Milagros Loll, MD      . sodium chloride 0.9 % injection 3 mL  3 mL  Intravenous Q12H Corky Sox, MD   3 mL at 03/18/15 0928  . [START ON 03/19/2015] vancomycin (VANCOCIN) IVPB 750 mg/150 ml premix  750 mg Intravenous Q T,Th,Sa-HD Axel Filler, MD         Discharge Medications: Please see discharge  summary for a list of discharge medications.  Relevant Imaging Results:  Relevant Lab Results:   Additional Information SSN: [id number]  Benard Halsted, LCSWA

## 2015-03-19 DIAGNOSIS — J90 Pleural effusion, not elsewhere classified: Secondary | ICD-10-CM

## 2015-03-19 DIAGNOSIS — R7881 Bacteremia: Secondary | ICD-10-CM | POA: Insufficient documentation

## 2015-03-19 LAB — RENAL FUNCTION PANEL
ALBUMIN: 1.9 g/dL — AB (ref 3.5–5.0)
ANION GAP: 10 (ref 5–15)
BUN: 26 mg/dL — ABNORMAL HIGH (ref 6–20)
CHLORIDE: 95 mmol/L — AB (ref 101–111)
CO2: 24 mmol/L (ref 22–32)
Calcium: 9.1 mg/dL (ref 8.9–10.3)
Creatinine, Ser: 3.81 mg/dL — ABNORMAL HIGH (ref 0.44–1.00)
GFR calc Af Amer: 16 mL/min — ABNORMAL LOW (ref >60)
GFR calc non Af Amer: 14 mL/min — ABNORMAL LOW (ref >60)
GLUCOSE: 100 mg/dL — AB (ref 65–99)
PHOSPHORUS: 2.8 mg/dL (ref 2.5–4.6)
POTASSIUM: 4.9 mmol/L (ref 3.5–5.1)
Sodium: 129 mmol/L — ABNORMAL LOW (ref 135–145)

## 2015-03-19 LAB — CULTURE, BLOOD (ROUTINE X 2)

## 2015-03-19 LAB — CBC
HEMATOCRIT: 29.6 % — AB (ref 36.0–46.0)
HEMOGLOBIN: 9.8 g/dL — AB (ref 12.0–15.0)
MCH: 31.1 pg (ref 26.0–34.0)
MCHC: 33.1 g/dL (ref 30.0–36.0)
MCV: 94 fL (ref 78.0–100.0)
Platelets: 43 10*3/uL — ABNORMAL LOW (ref 150–400)
RBC: 3.15 MIL/uL — ABNORMAL LOW (ref 3.87–5.11)
RDW: 18 % — AB (ref 11.5–15.5)
WBC: 15.1 10*3/uL — ABNORMAL HIGH (ref 4.0–10.5)

## 2015-03-19 LAB — PATHOLOGIST SMEAR REVIEW

## 2015-03-19 LAB — PROTIME-INR
INR: 1.97 — AB (ref 0.00–1.49)
Prothrombin Time: 22.3 seconds — ABNORMAL HIGH (ref 11.6–15.2)

## 2015-03-19 MED ORDER — LIDOCAINE-PRILOCAINE 2.5-2.5 % EX CREA: 1.0000 "application " | TOPICAL_CREAM | CUTANEOUS | Status: DC | PRN

## 2015-03-19 MED ORDER — MIDODRINE HCL 5 MG PO TABS
ORAL_TABLET | ORAL | Status: AC
Start: 2015-03-19 — End: 2015-03-19
  Administered 2015-03-19: 5 mg via ORAL
  Filled 2015-03-19: qty 1

## 2015-03-19 MED ORDER — HEPARIN SODIUM (PORCINE) 1000 UNIT/ML DIALYSIS
100.0000 [IU]/kg | INTRAMUSCULAR | Status: DC | PRN
  Filled 2015-03-19: qty 8

## 2015-03-19 MED ORDER — LIDOCAINE HCL (PF) 1 % IJ SOLN: 5.0000 mL | INTRAMUSCULAR | Status: DC | PRN

## 2015-03-19 MED ORDER — ALTEPLASE 2 MG IJ SOLR
2.0000 mg | Freq: Once | INTRAMUSCULAR | Status: DC | PRN
  Filled 2015-03-19: qty 2

## 2015-03-19 MED ORDER — HEPARIN SODIUM (PORCINE) 1000 UNIT/ML DIALYSIS: 40.0000 [IU]/kg | Freq: Once | INTRAMUSCULAR | Status: DC

## 2015-03-19 MED ORDER — PENTAFLUOROPROP-TETRAFLUOROETH EX AERO: 1.0000 "application " | INHALATION_SPRAY | CUTANEOUS | Status: DC | PRN

## 2015-03-19 MED ORDER — SODIUM CHLORIDE 0.9 % IV SOLN: 100.0000 mL | INTRAVENOUS | Status: DC | PRN

## 2015-03-19 MED ORDER — HEPARIN SODIUM (PORCINE) 1000 UNIT/ML DIALYSIS: 1000.0000 [IU] | INTRAMUSCULAR | Status: DC | PRN

## 2015-03-19 NOTE — Procedures (Signed)
I was present at this session.  I have reviewed the session itself and made appropriate changes.  BP drop quickly.  Will decrease TMP, access press ok.  LLE AVG ok  [name] L 12/20/20168:41 AM

## 2015-03-19 NOTE — Progress Notes (Signed)
Subjective: Interval History: has complaints tired.  Objective: Vital signs in last 24 hours: Temp:  [97.4 F (36.3 C)-98.2 F (36.8 C)] 97.5 F (36.4 C) (12/20 0703) Pulse Rate:  [69-100] 100 (12/20 0830) Resp:  [11-17] 17 (12/20 0830) BP: (84-164)/(58-99) 84/58 mmHg (12/20 0830) SpO2:  [97 %-99 %] 97 % (12/20 0703) Weight:  [77.5 kg (170 lb 13.7 oz)-79.1 kg (174 lb 6.1 oz)] 77.5 kg (170 lb 13.7 oz) (12/20 0703) Weight change: 0.2 kg (7.1 oz)  Intake/Output from previous day: 12/19 0701 - 12/20 0700 In: 3 [I.V.:3] Out: -  Intake/Output this shift:    General appearance: cooperative, moderately obese and slowed mentation Resp: diminished breath sounds bilaterally and rales bibasilar Cardio: S1, S2 normal and systolic murmur: holosystolic 2/6, blowing at apex GI: obese, pos bs, soft, liver down 5 cm Extremities: AVG LLE  Lab Results:  Recent Labs  03/18/15 0610 03/19/15 0749  WBC 14.2* 15.1*  HGB 10.2* 9.8*  HCT 32.4* 29.6*  PLT 27* 43*   BMET:  Recent Labs  03/17/15 0540 03/19/15 0748  NA 131* 129*  K 4.1 4.9  CL 95* 95*  CO2 29 24  GLUCOSE 167* 100*  BUN 6 26*  CREATININE 2.25* 3.81*  CALCIUM 8.3* 9.1   No results for input(s): PTH in the last 72 hours. Iron Studies: No results for input(s): IRON, TIBC, TRANSFERRIN, FERRITIN in the last 72 hours.  Studies/Results: Dg Chest 1 View  03/17/2015  CLINICAL DATA:  Status post right-sided thoracentesis EXAM: CHEST  1 VIEW COMPARISON:  03/16/2015 FINDINGS: Cardiac shadow is enlarged. A right-sided thoracentesis has been performed with reduction in right-sided pleural effusion. No pneumothorax is seen. A stable left axillary stent is noted. No bony abnormality is seen. IMPRESSION: No pneumothorax following right thoracentesis. Electronically Signed   By: Inez Catalina M.D.   On: 03/17/2015 10:54   Ct Chest Wo Contrast  03/18/2015  CLINICAL DATA:  Shortness of Breath EXAM: CT CHEST WITHOUT CONTRAST TECHNIQUE:  Multidetector CT imaging of the chest was performed following the standard protocol without IV contrast. COMPARISON:  Chest CT March 10, 2015; chest radiograph March 17, 2015 FINDINGS: Mediastinum/Lymph Nodes: Visualized thyroid appears normal. There are multiple small mediastinal lymph nodes without frank adenopathy. There is a fairly small pericardial effusion. There are scattered foci of coronary artery calcification. There is no thoracic aortic aneurysm. Visualized great vessels appear unremarkable on this noncontrast enhanced study. Heart is mildly enlarged. Lungs/Pleura: There are persistent moderate pleural effusions bilaterally with bibasilar consolidation. The ground-glass opacity noted in the upper lobes on recent prior study has resolved. There is no new opacity in the lung parenchyma. On axial slice 25 series 3, there is a 4 mm nodular opacity in the lateral segment right middle lobe. Upper abdomen: No focal lesions are identified in the visualized upper abdomen region. Musculoskeletal: No blastic or lytic bone lesions. IMPRESSION: Moderate pleural effusions bilaterally with stable bibasilar consolidation. Some of this consolidation is new atelectatic in etiology, although there is concern for bibasilar pneumonia as well. The previously noted upper lobe ground-glass opacity has resolved bilaterally. 4 mm nodular opacity lateral segment right middle lobe. Followup of this nodular opacity should be based on Fleischner Society guidelines. If the patient is at high risk for bronchogenic carcinoma, follow-up chest CT at 1 year is recommended. If the patient is at low risk, no follow-up is needed. This recommendation follows the consensus statement: Guidelines for Management of Small Pulmonary Nodules Detected on CT Scans: A  Statement from the [place] as published in Radiology 2005; 237:395-400. Cardiomegaly with small pericardial effusion. Scattered foci of coronary artery calcification.  Small mediastinal lymph nodes without adenopathy by size criteria. Electronically Signed   By: Lowella Grip III M.D.   On: 03/18/2015 18:48   US Thoracentesis Asp Pleural Space W/img Guide  03/17/2015  INDICATION: Symptomatic right sided pleural effusion EXAM: US THORACENTESIS ASP PLEURAL SPACE W/IMG GUIDE COMPARISON:  Thoracentesis 03/11/2015. MEDICATIONS: None COMPLICATIONS: None immediate TECHNIQUE: Informed written consent was obtained from the patient after a discussion of the risks, benefits and alternatives to treatment. A timeout was performed prior to the initiation of the procedure. Initial ultrasound scanning demonstrates a right loculated pleural effusion. The lower chest was prepped and draped in the usual sterile fashion. 1% lidocaine was used for local anesthesia. Under direct ultrasound guidance, a 19 gauge, 7-cm, Yueh catheter was introduced. An ultrasound image was saved for documentation purposes. The thoracentesis was performed. The catheter was removed and a dressing was applied. The patient tolerated the procedure well without immediate post procedural complication. The patient was escorted to have an upright chest radiograph. FINDINGS: A total of approximately 60 ml of dark bloody fluid was removed. Requested samples were sent to the laboratory. IMPRESSION: Successful ultrasound-guided right sided thoracentesis yielding 60 ml of pleural fluid. Read By:  Tsosie Billing PA-C Electronically Signed   By: Marybelle Killings M.D.   On: 03/17/2015 10:32    I have reviewed the patient's current medications.  Assessment/Plan: 1 ESRD for Hd, slowly lower vol as tol, BP limits 2 Anemia esa/Fe 3 HPTh vitD 4 low ptlt SLE on Deca, s/p ivig , stable 5 psychosis on meds.  SLE vs other 6 SLE on meds 7 Infx,  Pseudo originally from groin cath, then acinetobacter urine vs decub. Now staph  Decub vs effusions vs urine   On AB 8 Exudative pleural effusions ? Drain, w/u under way 9 [place] 10  Decub P HD, esa, AB, CT of chest,     LOS: 28 days   Camya Haydon L 03/19/2015,8:41 AM

## 2015-03-19 NOTE — Progress Notes (Signed)
Name: [name] MRN: [id number] DOB: [birth year]    ADMISSION DATE:  02/19/2015 CONSULTATION DATE:  03/21/2015  REFERRING MD :  Graciella Freer   CHIEF COMPLAINT:  Bilateral effusions  BRIEF PATIENT DESCRIPTION: 39 year old female admitted to [facility] 11/22 from CIR with altered mental status. This was thought to be secondary to sepsis from VRE, acinetobacter UTI. She has now developed abnormal chest x-ray with bilateral pleural effusions. PCCM consulted for further evaluation of effusions. Had left thora 12/13 by IR (culture neg), right thora 12/18 by IR (loculated with 900cc dark bloody fluid; exudative with LDH 1634).  --------------------------------------------------------------------------------------------------------------- Called back 12/19 for further recs.  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  B/l atx. Nodular pleural thickening in right lower lobe - Pleural disease or metastatic disease cannot be excluded. There is nodular mass or adenopathy in right upper lobe medially measures at least 3.7 cm. CXR 12/17 > enlarged cardiac silhouette. Bibasilar effusions --------------------------------------------------------------------------------------------------------------- CT chest 12/19 > mod bilateral effusions with stable bibasilar consolidation / atx. 42mm nodular opacity in RML.    SUBJECTIVE: No acute events.  CT chest repeated yesterday and showed persistent moderate bilateral effusions, R > L, along with bilateral basilar atx.  There was also a 79mm nodular opacity in lateral segment of  RML.  VITAL SIGNS: Temp:  [97.4 F (36.3 C)-97.6 F (36.4 C)] 97.5 F (36.4 C) (12/20 0703) Pulse Rate:  [69-100] 79 (12/20 1110) Resp:  [11-19] 19 (12/20 1110) BP: (65-163)/(42-99) 87/55 mmHg (12/20 1110) SpO2:  [97 %-99 %] 97 % (12/20 0703) Weight:  [75.6 kg (166 lb 10.7 oz)-79.1 kg (174 lb 6.1 oz)] 75.6 kg (166 lb 10.7 oz) (12/20 1110)  PHYSICAL EXAMINATION: General: Pleasant adult AA female, resting comfortably, in NAD. Neuro: A&O x 3, non-focal.  HEENT: [place]/AT. PERRL, sclerae anicteric. Cardiovascular: RRR, no M/R/G.  Lungs: Respirations even and unlabored.  Diminished bilateral bases; otherwise clear. Abdomen: BS x 4, soft, NT/ND.  Musculoskeletal: No gross deformities, no edema.  Skin: Intact, warm, no rashes.     Recent Labs Lab 03/16/15 1337 03/17/15 0540 03/19/15 0748  NA 135 131* 129*  K 3.4* 4.1 4.9  CL 101 95* 95*  CO2 27 29 24   BUN 21* 6 26*  CREATININE 3.87* 2.25* 3.81*  GLUCOSE 71 167* 100*    Recent Labs Lab 03/17/15 0540 03/18/15 0610 03/19/15 0749  HGB 9.0* 10.2* 9.8*  HCT 27.8* 32.4* 29.6*  WBC 11.4* 14.2* 15.1*  PLT 30* 27* 43*   Ct Chest Wo Contrast  03/18/2015  CLINICAL DATA:  Shortness of Breath EXAM: CT CHEST WITHOUT CONTRAST TECHNIQUE: Multidetector CT imaging of the chest was performed following the standard protocol without IV contrast. COMPARISON:  Chest CT March 10, 2015; chest radiograph March 17, 2015 FINDINGS: Mediastinum/Lymph Nodes: Visualized thyroid appears normal. There are multiple small mediastinal lymph nodes without frank adenopathy. There is a fairly small pericardial effusion. There are scattered foci of coronary artery calcification. There is no thoracic aortic aneurysm. Visualized great vessels appear unremarkable on this noncontrast enhanced study. Heart is mildly enlarged. Lungs/Pleura: There are persistent moderate pleural effusions bilaterally with bibasilar consolidation. The ground-glass  opacity noted in the  upper lobes on recent prior study has resolved. There is no new opacity in the lung parenchyma. On axial slice 25 series 3, there is a 4 mm nodular opacity in the lateral segment right middle lobe. Upper abdomen: No focal lesions are identified in the visualized upper abdomen region. Musculoskeletal: No blastic or lytic bone lesions. IMPRESSION: Moderate pleural effusions bilaterally with stable bibasilar consolidation. Some of this consolidation is new atelectatic in etiology, although there is concern for bibasilar pneumonia as well. The previously noted upper lobe ground-glass opacity has resolved bilaterally. 4 mm nodular opacity lateral segment right middle lobe. Followup of this nodular opacity should be based on Fleischner Society guidelines. If the patient is at high risk for bronchogenic carcinoma, follow-up chest CT at 1 year is recommended. If the patient is at low risk, no follow-up is needed. This recommendation follows the consensus statement: Guidelines for Management of Small Pulmonary Nodules Detected on CT Scans: A Statement from the [place] as published in Radiology 2005; 237:395-400. Cardiomegaly with small pericardial effusion. Scattered foci of coronary artery calcification. Small mediastinal lymph nodes without adenopathy by size criteria. Electronically Signed   By: Lowella Grip III M.D.   On: 03/18/2015 18:48    ASSESSMENT / PLAN:  Bilateral likely parapneumonic pleural effusions - s/p left thora 12/11 with 300cc bloody fluid, right thora 12/18 with 900cc bloody fluid, exudative and loculated per IR report.  Suspected loculated right effusion - as above. HCAP likely with parapneumonic effusion - 1/2 BC's with GPC's.  Of note, she is immunosuppresed (on plaquenil). Plan: Recommend guided placement of chest tube via IR (Dr. Chase Caller has discussed this with resident MD). Seen by CVTS previously and deemed high risk surgical candidate.  She may however ultimately  require VATS if she has significant debris / complicated effusion. Continue empiric abx (vanc / ceftaz). Follow PCT. CXR in AM.   ? Pulmonary mass - question whether these could represent loculations.  No cytology sent with pleural fluid studies from right sided thora. 28mm RML nodule. Plan: Add on cytology analysis of right sided pleural fluid. F/u CT in 1 year.   Rest per primary team.   Montey Hora, PA - [facility] Pager: [phone]  or [phone] 03/19/2015, 2:11 PM

## 2015-03-19 NOTE — Progress Notes (Signed)
Subjective: [name] had no acute events overnight. She says she feels a bit fatigued during dialysis currently but otherwise has no complaints. She denies fevers, shortness of breath, cough, chest pain, or any other symptoms at this time.  Objective: Vital signs in last 24 hours: Filed Vitals:   03/19/15 1000 03/19/15 1030 03/19/15 1100 03/19/15 1110  BP: 82/45 87/50 103/78 87/55  Pulse: 88 80 89 79  Temp:      TempSrc:      Resp: 18 13 18 19   Height:      Weight:    166 lb 10.7 oz (75.6 kg)  SpO2:       Weight change: 7.1 oz (0.2 kg)  Intake/Output Summary (Last 24 hours) at 03/19/15 1442 Last data filed at 03/19/15 1110  Gross per 24 hour  Intake      3 ml  Output   1759 ml  Net  -1756 ml   General Apperance: NAD HEENT: Normocephalic, atraumatic, PERRL, EOMI, anicteric sclera Neck: Supple, trachea midline Lungs: Decreased breath sounds at right lower base. Clear to auscultation otherwise. Heart: Regular rate and rhythm Abdomen: Soft, nontender, nondistended, no rebound/guarding Extremities: Warm and well perfused, 2+ edema of the left thigh around graft site, 1+ distal LLE edema. Bandages around graft do not have any evidence of bleeding. Pulses: 1+ throughout Skin: No rashes or lesions Neurologic: Alert and oriented x 3. No gross deficits. Psych: Flat affect but appropriate.  Lab Results: Basic Metabolic Panel:  Recent Labs Lab 03/17/15 0540 03/19/15 0748  NA 131* 129*  K 4.1 4.9  CL 95* 95*  CO2 29 24  GLUCOSE 167* 100*  BUN 6 26*  CREATININE 2.25* 3.81*  CALCIUM 8.3* 9.1  PHOS 1.5* 2.8   Liver Function Tests:  Recent Labs Lab 03/17/15 0540 03/17/15 1413 03/19/15 0748  PROT  --  6.7  --   ALBUMIN 2.0*  --  1.9*   CBC:  Recent Labs Lab 03/18/15 0610 03/19/15 0749  WBC 14.2* 15.1*  HGB 10.2* 9.8*  HCT 32.4* 29.6*  MCV 94.5 94.0  PLT 27* 43*   Coagulation:  Recent Labs Lab 03/16/15 0510 03/17/15 0540 03/18/15 0610  03/19/15 0600  LABPROT 17.4* 19.7* 20.3* 22.3*  INR 1.41 1.67* 1.74* 1.97*    Micro Results: Recent Results (from the past 240 hour(s))  MRSA PCR Screening     Status: None   Collection Time: 03/09/15  7:48 PM  Result Value Ref Range Status   MRSA by PCR NEGATIVE NEGATIVE Final    Comment:        The GeneXpert MRSA Assay (FDA approved for NASAL specimens only), is one component of a comprehensive MRSA colonization surveillance program. It is not intended to diagnose MRSA infection nor to guide or monitor treatment for MRSA infections.   Culture, body fluid-bottle     Status: None   Collection Time: 03/11/15  2:40 PM  Result Value Ref Range Status   Specimen Description FLUID LEFT PLEURAL  Final   Special Requests NONE  Final   Culture NO GROWTH 5 DAYS  Final   Report Status 03/16/2015 FINAL  Final  Gram stain     Status: None   Collection Time: 03/11/15  2:40 PM  Result Value Ref Range Status   Specimen Description FLUID LEFT PLEURAL  Final   Special Requests NONE  Final   Gram Stain   Final    MODERATE WBC PRESENT,BOTH PMN AND MONONUCLEAR NO ORGANISMS SEEN  Report Status 03/11/2015 FINAL  Final  Culture, blood (routine x 2)     Status: None   Collection Time: 03/16/15 10:45 AM  Result Value Ref Range Status   Specimen Description BLOOD RIGHT HAND  Final   Special Requests IN PEDIATRIC BOTTLE 0.5CC  Final   Culture  Setup Time   Final    GRAM POSITIVE COCCI IN CLUSTERS AEROBIC BOTTLE ONLY CRITICAL RESULT CALLED TO, READ BACK BY AND VERIFIED WITH: J ZHU 03/17/15 @ 0730 M VESTAL    Culture STAPHYLOCOCCUS SPECIES (COAGULASE NEGATIVE)  Final   Report Status 03/19/2015 FINAL  Final   Organism ID, Bacteria STAPHYLOCOCCUS SPECIES (COAGULASE NEGATIVE)  Final      Susceptibility   Staphylococcus species (coagulase negative) - MIC*    CIPROFLOXACIN >=8 RESISTANT Resistant     ERYTHROMYCIN >=8 RESISTANT Resistant     GENTAMICIN >=16 RESISTANT Resistant     OXACILLIN  >=4 RESISTANT Resistant     TETRACYCLINE >=16 RESISTANT Resistant     VANCOMYCIN 1 SENSITIVE Sensitive     TRIMETH/SULFA 160 RESISTANT Resistant     CLINDAMYCIN >=8 RESISTANT Resistant     RIFAMPIN <=0.5 SENSITIVE Sensitive     Inducible Clindamycin NEGATIVE Sensitive     * STAPHYLOCOCCUS SPECIES (COAGULASE NEGATIVE)  Culture, blood (routine x 2)     Status: None   Collection Time: 03/16/15 11:25 AM  Result Value Ref Range Status   Specimen Description BLOOD RIGHT FOREARM  Final   Special Requests BOTTLES DRAWN AEROBIC AND ANAEROBIC 5CC  Final   Culture  Setup Time   Final    GRAM POSITIVE COCCI IN CLUSTERS ANAEROBIC BOTTLE ONLY CRITICAL RESULT CALLED TO, READ BACK BY AND VERIFIED WITH: S LEE 03/17/15 @ 3 M VESTAL    Culture   Final    STAPHYLOCOCCUS SPECIES (COAGULASE NEGATIVE) SUSCEPTIBILITIES PERFORMED ON PREVIOUS CULTURE WITHIN THE LAST 5 DAYS.    Report Status 03/19/2015 FINAL  Final  Gram stain     Status: None   Collection Time: 03/17/15 10:27 AM  Result Value Ref Range Status   Specimen Description FLUID PLEURAL RIGHT  Final   Special Requests NONE  Final   Gram Stain   Final    RARE WBC PRESENT, PREDOMINANTLY MONONUCLEAR NO ORGANISMS SEEN    Report Status 03/17/2015 FINAL  Final  AFB culture with smear     Status: None (Preliminary result)   Collection Time: 03/17/15 10:27 AM  Result Value Ref Range Status   Specimen Description FLUID PLEURAL RIGHT  Final   Special Requests NONE  Final   Acid Fast Smear   Final    NO ACID FAST BACILLI SEEN Performed at [facility]    Culture   Final    CULTURE WILL BE EXAMINED FOR 6 WEEKS BEFORE ISSUING A FINAL REPORT Performed at [facility]    Report Status PENDING  Incomplete  Culture, body fluid-bottle     Status: None (Preliminary result)   Collection Time: 03/17/15 10:27 AM  Result Value Ref Range Status   Specimen Description FLUID PLEURAL RIGHT  Final   Special Requests NONE  Final   Culture NO  GROWTH 2 DAYS  Final   Report Status PENDING  Incomplete  Culture, Urine     Status: None   Collection Time: 03/17/15 11:21 AM  Result Value Ref Range Status   Specimen Description URINE, CATHETERIZED  Final   Special Requests NONE  Final   Culture MULTIPLE SPECIES PRESENT, SUGGEST  RECOLLECTION  Final   Report Status 03/18/2015 FINAL  Final  Blood culture (routine x 2)     Status: None (Preliminary result)   Collection Time: 03/18/15 12:39 PM  Result Value Ref Range Status   Specimen Description BLOOD RIGHT HAND  Final   Special Requests IN PEDIATRIC BOTTLE 1MLS  Final   Culture NO GROWTH 1 DAY  Final   Report Status PENDING  Incomplete  Blood culture (routine x 2)     Status: None (Preliminary result)   Collection Time: 03/18/15  1:02 PM  Result Value Ref Range Status   Specimen Description BLOOD RIGHT HAND  Final   Special Requests IN PEDIATRIC BOTTLE 3CCS  Final   Culture NO GROWTH 1 DAY  Final   Report Status PENDING  Incomplete  Culture, Urine     Status: None (Preliminary result)   Collection Time: 03/18/15  3:23 PM  Result Value Ref Range Status   Specimen Description URINE, RANDOM  Final   Special Requests vancomycin, ceftazidime Immunocompromised  Final   Culture CULTURE REINCUBATED FOR BETTER GROWTH  Final   Report Status PENDING  Incomplete   Studies/Results: Ct Chest Wo Contrast  03/18/2015  CLINICAL DATA:  Shortness of Breath EXAM: CT CHEST WITHOUT CONTRAST TECHNIQUE: Multidetector CT imaging of the chest was performed following the standard protocol without IV contrast. COMPARISON:  Chest CT March 10, 2015; chest radiograph March 17, 2015 FINDINGS: Mediastinum/Lymph Nodes: Visualized thyroid appears normal. There are multiple small mediastinal lymph nodes without frank adenopathy. There is a fairly small pericardial effusion. There are scattered foci of coronary artery calcification. There is no thoracic aortic aneurysm. Visualized great vessels appear  unremarkable on this noncontrast enhanced study. Heart is mildly enlarged. Lungs/Pleura: There are persistent moderate pleural effusions bilaterally with bibasilar consolidation. The ground-glass opacity noted in the upper lobes on recent prior study has resolved. There is no new opacity in the lung parenchyma. On axial slice 25 series 3, there is a 4 mm nodular opacity in the lateral segment right middle lobe. Upper abdomen: No focal lesions are identified in the visualized upper abdomen region. Musculoskeletal: No blastic or lytic bone lesions. IMPRESSION: Moderate pleural effusions bilaterally with stable bibasilar consolidation. Some of this consolidation is new atelectatic in etiology, although there is concern for bibasilar pneumonia as well. The previously noted upper lobe ground-glass opacity has resolved bilaterally. 4 mm nodular opacity lateral segment right middle lobe. Followup of this nodular opacity should be based on Fleischner Society guidelines. If the patient is at high risk for bronchogenic carcinoma, follow-up chest CT at 1 year is recommended. If the patient is at low risk, no follow-up is needed. This recommendation follows the consensus statement: Guidelines for Management of Small Pulmonary Nodules Detected on CT Scans: A Statement from the [place] as published in Radiology 2005; 237:395-400. Cardiomegaly with small pericardial effusion. Scattered foci of coronary artery calcification. Small mediastinal lymph nodes without adenopathy by size criteria. Electronically Signed   By: Lowella Grip III M.D.   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  Hospital acquired pneumonia with likely parapneumonic effusions: Completed 7 days of ceftazidime on 12/16. She has bilateral pleural effusions. Previously discussed with pulmonology and CT surgery - poor  candidate for surgical intervention. -BCx from 12/17 growing coag-negative Staph in 2/2. Possible sources are HAP/parapneumonic effusion vs urinary tract -F/u UCx - showed multiple species present - talked with micro today who will speciate and contact us tomorrow -On vancomycin and ceftazidime. Vanc adequately covers coag-neg staph on BCx but does have recent h/o VRE URI - will follow UCx -Follow-up BCx to document clearance -Pulmonology reconsulted - appreciate recs. Talked with IR today who feel drainage would not be feasible at this time. Will continue with prolonged abx course currently. May require chest tube drainage in the future if clinical course changes.  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 Platelets increased from <5 to 140k. Currently stable but low counts (30s-40s). No clinically significant bleeding currently. -Heme following, appreciate recommendations -NPlate weekly -Dexamethasone 20mg  BID, day 4/4 of 2nd course -Continue Plaquenil 400mg  daily -Will need to discuss with rheumatology possible escalation of therapy for lupus.  Psychosis, Depression: She had acute confusion and hallucinations possibly 2/2 UTI. Her symptoms improved but worsened 12/9. 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 Low suspicion for Lupus cerebritis. Psychosis resolved. -Continue olanzapine 5mg  BID -Continue duloxetine 40mg  QD  ESRD on HD, s/p Left thigh AVG:AVG placed 01/04/2015. Back on her outpatient TTS schedule. She has some left leg edema likely 2/2 AVG. Venous duplex with post surgical changes and no DVT. -Midodrine 5mg  BID -Nephrology following, appreciate recs -HD per  nephrology  Acute on chronic anemia: Hgb stable -Continue to monitor. Transfuse prn for hgb <7.  Hx recent R. MCA Embolic CVA:  -Daily PT/INR -Coumadin held while plt <50.  Deconditioning -Continue PT -Currently not a CIR candidate; will need SNF placement  Hx SVT/Tachycardia: Continue Toprol-XL 12.5mg  daily  FEN: Renal VTE ppx: SCDs  FULL CODE  Dispo: Disposition is deferred at this time, awaiting improvement of current medical problems.   The patient does have a current PCP Lin Landsman, MD) and does not need an [facility] hospital follow-up appointment after discharge.  The patient does not have transportation limitations that hinder transportation to clinic appointments   LOS: 28 days   Norval Gable, MD 03/19/2015, 2:42 PM

## 2015-03-19 NOTE — Progress Notes (Signed)
Physical Therapy Treatment Patient Details Name: [name] MRN: [id number] DOB: 10/17/1975 Today's Date: 07-Apr-2015    History of Present Illness Pt is a 38 y.o. F from CIR who has had several recent hospitalizations.  She had a Rt MCA infarction and developed pseudomonal bacteremia from Rt femoral HD line which was treated.  Was doing well in CIR until she acutely developed severe confusion and hallucinations following HD sessions, likely a result of sepsis from Lt ant thigh abscess. Pt's additional PMH includes lupus, fibromyalgia, anemia.    PT Comments    Pt continues to be very weak and unable to stand. Nursing using maxisky to get pt up to recliner.   Follow Up Recommendations  SNF     Equipment Recommendations  Other (comment) (Has received w/c and cushion)    Recommendations for Other Services OT consult     Precautions / Restrictions Precautions Precautions: Fall Restrictions Weight Bearing Restrictions: No    Mobility  Bed Mobility                  Transfers                 General transfer comment: Attempted to stand from chair with +2 assist but was unable to achieve standing. On second attempt was able to bring hips off chair but unable to extend hips, knees, and trunk to achieve true upright posture.  Ambulation/Gait                 Stairs            Wheelchair Mobility    Modified Rankin (Stroke Patients Only)       Balance                                    Cognition Arousal/Alertness: Awake/alert Behavior During Therapy: Anxious;Flat affect Overall Cognitive Status: Within Functional Limits for tasks assessed                      Exercises General Exercises - Lower Extremity Ankle Circles/Pumps: Both;Other (comment);Seated (3) Quad Sets: Both;Other (comment);Seated (3)    General Comments        Pertinent Vitals/Pain Pain Assessment: Faces Faces Pain Scale: Hurts even  more Pain Location: buttocks Pain Descriptors / Indicators: Aching Pain Intervention(s): Limited activity within patient's tolerance;Repositioned    Home Living                      Prior Function            PT Goals (current goals can now be found in the care plan section) Progress towards PT goals: Not progressing toward goals - comment    Frequency  Min 3X/week    PT Plan Current plan remains appropriate    Co-evaluation             End of Session Equipment Utilized During Treatment:  Charlaine Dalton) Activity Tolerance: Patient tolerated treatment well Patient left: with call bell/phone within reach;in chair;with chair alarm set     Time: [id number] PT Time Calculation (min) (ACUTE ONLY): 17 min  Charges:  $Therapeutic Activity: 8-22 mins                    G Codes:      Dakwon Wenberg 04/22/2015, 4:36 PM [facility] PT [phone]

## 2015-03-19 NOTE — Progress Notes (Signed)
IP Rehab was requested to take a look at pt. again during [place] meeting this am for appropriateness for return to CIR.  Chart reviewed including her progress in therapies.  Therapies and nursing are working to progress pt's sitting tolerance for OP HD, however so far she has not been able to tolerate.  Additionally, pt. At low functional level with acute therapies and it is unlikely that she could tolerate rigors of IP Rehab. Discussed with rehab team.  Pt. Not currently CIR candidate.  Please call if questions.  [place] Admissions Coordinator Cell [phone] Office [phone]

## 2015-03-20 LAB — CBC WITH DIFFERENTIAL/PLATELET
BASOS PCT: 0 %
Basophils Absolute: 0 10*3/uL (ref 0.0–0.1)
Eosinophils Absolute: 0 10*3/uL (ref 0.0–0.7)
Eosinophils Relative: 0 %
HEMATOCRIT: 31.1 % — AB (ref 36.0–46.0)
Hemoglobin: 10.1 g/dL — ABNORMAL LOW (ref 12.0–15.0)
LYMPHS ABS: 0.5 10*3/uL — AB (ref 0.7–4.0)
LYMPHS PCT: 4 %
MCH: 30.8 pg (ref 26.0–34.0)
MCHC: 32.5 g/dL (ref 30.0–36.0)
MCV: 94.8 fL (ref 78.0–100.0)
MONO ABS: 0.4 10*3/uL (ref 0.1–1.0)
MONOS PCT: 3 %
NEUTROS ABS: 10.5 10*3/uL — AB (ref 1.7–7.7)
Neutrophils Relative %: 93 %
Platelets: 58 10*3/uL — ABNORMAL LOW (ref 150–400)
RBC: 3.28 MIL/uL — ABNORMAL LOW (ref 3.87–5.11)
RDW: 18.2 % — AB (ref 11.5–15.5)
WBC: 11.4 10*3/uL — ABNORMAL HIGH (ref 4.0–10.5)

## 2015-03-20 LAB — PROTIME-INR
INR: 1.84 — AB (ref 0.00–1.49)
Prothrombin Time: 21.2 seconds — ABNORMAL HIGH (ref 11.6–15.2)

## 2015-03-20 LAB — PROCALCITONIN: Procalcitonin: 16.94 ng/mL

## 2015-03-20 MED ORDER — WARFARIN - PHARMACIST DOSING INPATIENT
Freq: Every day | Status: DC
  Administered 2015-03-21: 18:00:00

## 2015-03-20 MED ORDER — BOOST / RESOURCE BREEZE PO LIQD: 1.0000 | ORAL | Status: DC

## 2015-03-20 MED ORDER — WARFARIN SODIUM 2.5 MG PO TABS
2.5000 mg | ORAL_TABLET | Freq: Once | ORAL | Status: AC
  Administered 2015-03-20: 2.5 mg via ORAL
  Filled 2015-03-20: qty 1

## 2015-03-20 NOTE — Progress Notes (Signed)
Subjective: [name] had no acute events overnight. Currently, she has no complaints. She denies fevers, shortness of breath, cough, chest pain, or any other symptoms at this time.  Objective: Vital signs in last 24 hours: Filed Vitals:   03/19/15 1718 03/19/15 2151 03/20/15 0623 03/20/15 0629  BP: 124/71 114/76  114/69  Pulse: 75 77  67  Temp:  98.7 F (37.1 C)  97.6 F (36.4 C)  TempSrc:    Oral  Resp:  20  18  Height:      Weight:   166 lb 12.8 oz (75.66 kg)   SpO2:  100%  100%   Weight change: -3 lb 8.4 oz (-1.6 kg)  Intake/Output Summary (Last 24 hours) at 03/20/15 1035 Last data filed at 03/20/15 0950  Gross per 24 hour  Intake    433 ml  Output   1759 ml  Net  -1326 ml   General Apperance: NAD HEENT: Normocephalic, atraumatic, PERRL, EOMI, anicteric sclera Neck: Supple, trachea midline Lungs: Decreased breath sounds at right lower base. Clear to auscultation otherwise. Heart: Regular rate and rhythm Abdomen: Soft, nontender, nondistended, no rebound/guarding Extremities: Warm and well perfused, 2+ edema of the left thigh around graft site, 1+ distal LLE edema. Bandages around graft do not have any evidence of bleeding. Pulses: 1+ throughout Skin: No rashes or lesions Neurologic: Alert and oriented x 3. No gross deficits. Psych: Flat affect but appropriate.  Lab Results: Basic Metabolic Panel:  Recent Labs Lab 03/17/15 0540 03/19/15 0748  NA 131* 129*  K 4.1 4.9  CL 95* 95*  CO2 29 24  GLUCOSE 167* 100*  BUN 6 26*  CREATININE 2.25* 3.81*  CALCIUM 8.3* 9.1  PHOS 1.5* 2.8   Liver Function Tests:  Recent Labs Lab 03/17/15 0540 03/17/15 1413 03/19/15 0748  PROT  --  6.7  --   ALBUMIN 2.0*  --  1.9*   CBC:  Recent Labs Lab 03/19/15 0749 03/20/15 0628  WBC 15.1* 11.4*  NEUTROABS  --  10.5*  HGB 9.8* 10.1*  HCT 29.6* 31.1*  MCV 94.0 94.8  PLT 43* 58*   Coagulation:  Recent Labs Lab 03/17/15 0540 03/18/15 0610 03/19/15 0600  03/20/15 0628  LABPROT 19.7* 20.3* 22.3* 21.2*  INR 1.67* 1.74* 1.97* 1.84*    Micro Results: Recent Results (from the past 240 hour(s))  Culture, body fluid-bottle     Status: None   Collection Time: 03/11/15  2:40 PM  Result Value Ref Range Status   Specimen Description FLUID LEFT PLEURAL  Final   Special Requests NONE  Final   Culture NO GROWTH 5 DAYS  Final   Report Status 03/16/2015 FINAL  Final  Gram stain     Status: None   Collection Time: 03/11/15  2:40 PM  Result Value Ref Range Status   Specimen Description FLUID LEFT PLEURAL  Final   Special Requests NONE  Final   Gram Stain   Final    MODERATE WBC PRESENT,BOTH PMN AND MONONUCLEAR NO ORGANISMS SEEN    Report Status 03/11/2015 FINAL  Final  Culture, blood (routine x 2)     Status: None   Collection Time: 03/16/15 10:45 AM  Result Value Ref Range Status   Specimen Description BLOOD RIGHT HAND  Final   Special Requests IN PEDIATRIC BOTTLE 0.5CC  Final   Culture  Setup Time   Final    GRAM POSITIVE COCCI IN CLUSTERS AEROBIC BOTTLE ONLY CRITICAL RESULT CALLED TO, READ BACK BY AND  VERIFIED WITH: J ZHU 03/17/15 @ 0730 M VESTAL    Culture STAPHYLOCOCCUS SPECIES (COAGULASE NEGATIVE)  Final   Report Status 03/19/2015 FINAL  Final   Organism ID, Bacteria STAPHYLOCOCCUS SPECIES (COAGULASE NEGATIVE)  Final      Susceptibility   Staphylococcus species (coagulase negative) - MIC*    CIPROFLOXACIN >=8 RESISTANT Resistant     ERYTHROMYCIN >=8 RESISTANT Resistant     GENTAMICIN >=16 RESISTANT Resistant     OXACILLIN >=4 RESISTANT Resistant     TETRACYCLINE >=16 RESISTANT Resistant     VANCOMYCIN 1 SENSITIVE Sensitive     TRIMETH/SULFA 160 RESISTANT Resistant     CLINDAMYCIN >=8 RESISTANT Resistant     RIFAMPIN <=0.5 SENSITIVE Sensitive     Inducible Clindamycin NEGATIVE Sensitive     * STAPHYLOCOCCUS SPECIES (COAGULASE NEGATIVE)  Culture, blood (routine x 2)     Status: None   Collection Time: 03/16/15 11:25 AM  Result  Value Ref Range Status   Specimen Description BLOOD RIGHT FOREARM  Final   Special Requests BOTTLES DRAWN AEROBIC AND ANAEROBIC 5CC  Final   Culture  Setup Time   Final    GRAM POSITIVE COCCI IN CLUSTERS ANAEROBIC BOTTLE ONLY CRITICAL RESULT CALLED TO, READ BACK BY AND VERIFIED WITH: S LEE 03/17/15 @ 4 M VESTAL    Culture   Final    STAPHYLOCOCCUS SPECIES (COAGULASE NEGATIVE) SUSCEPTIBILITIES PERFORMED ON PREVIOUS CULTURE WITHIN THE LAST 5 DAYS.    Report Status 03/19/2015 FINAL  Final  Gram stain     Status: None   Collection Time: 03/17/15 10:27 AM  Result Value Ref Range Status   Specimen Description FLUID PLEURAL RIGHT  Final   Special Requests NONE  Final   Gram Stain   Final    RARE WBC PRESENT, PREDOMINANTLY MONONUCLEAR NO ORGANISMS SEEN    Report Status 03/17/2015 FINAL  Final  AFB culture with smear     Status: None (Preliminary result)   Collection Time: 03/17/15 10:27 AM  Result Value Ref Range Status   Specimen Description FLUID PLEURAL RIGHT  Final   Special Requests NONE  Final   Acid Fast Smear   Final    NO ACID FAST BACILLI SEEN Performed at [facility]    Culture   Final    CULTURE WILL BE EXAMINED FOR 6 WEEKS BEFORE ISSUING A FINAL REPORT Performed at [facility]    Report Status PENDING  Incomplete  Culture, body fluid-bottle     Status: None (Preliminary result)   Collection Time: 03/17/15 10:27 AM  Result Value Ref Range Status   Specimen Description FLUID PLEURAL RIGHT  Final   Special Requests NONE  Final   Culture NO GROWTH 2 DAYS  Final   Report Status PENDING  Incomplete  Culture, Urine     Status: None (Preliminary result)   Collection Time: 03/17/15 11:21 AM  Result Value Ref Range Status   Specimen Description URINE, CATHETERIZED  Final   Special Requests NONE  Final   Culture   Final    >=100,000 COLONIES/mL YEAST 50,000 COLONIES/mL GRAM POSITIVE COCCI CULTURE REINCUBATED FOR BETTER GROWTH    Report Status  PENDING  Incomplete  Blood culture (routine x 2)     Status: None (Preliminary result)   Collection Time: 03/18/15 12:39 PM  Result Value Ref Range Status   Specimen Description BLOOD RIGHT HAND  Final   Special Requests IN PEDIATRIC BOTTLE 1MLS  Final   Culture NO GROWTH 1 DAY  Final   Report Status PENDING  Incomplete  Blood culture (routine x 2)     Status: None (Preliminary result)   Collection Time: 03/18/15  1:02 PM  Result Value Ref Range Status   Specimen Description BLOOD RIGHT HAND  Final   Special Requests IN PEDIATRIC BOTTLE 3CCS  Final   Culture NO GROWTH 1 DAY  Final   Report Status PENDING  Incomplete  Culture, Urine     Status: None (Preliminary result)   Collection Time: 03/18/15  3:23 PM  Result Value Ref Range Status   Specimen Description URINE, RANDOM  Final   Special Requests vancomycin, ceftazidime Immunocompromised  Final   Culture   Final    >=100,000 COLONIES/mL YEAST CULTURE REINCUBATED FOR BETTER GROWTH    Report Status PENDING  Incomplete   Studies/Results: Ct Chest Wo Contrast  03/18/2015  CLINICAL DATA:  Shortness of Breath EXAM: CT CHEST WITHOUT CONTRAST TECHNIQUE: Multidetector CT imaging of the chest was performed following the standard protocol without IV contrast. COMPARISON:  Chest CT March 10, 2015; chest radiograph March 17, 2015 FINDINGS: Mediastinum/Lymph Nodes: Visualized thyroid appears normal. There are multiple small mediastinal lymph nodes without frank adenopathy. There is a fairly small pericardial effusion. There are scattered foci of coronary artery calcification. There is no thoracic aortic aneurysm. Visualized great vessels appear unremarkable on this noncontrast enhanced study. Heart is mildly enlarged. Lungs/Pleura: There are persistent moderate pleural effusions bilaterally with bibasilar consolidation. The ground-glass opacity noted in the upper lobes on recent prior study has resolved. There is no new opacity in the lung  parenchyma. On axial slice 25 series 3, there is a 4 mm nodular opacity in the lateral segment right middle lobe. Upper abdomen: No focal lesions are identified in the visualized upper abdomen region. Musculoskeletal: No blastic or lytic bone lesions. IMPRESSION: Moderate pleural effusions bilaterally with stable bibasilar consolidation. Some of this consolidation is new atelectatic in etiology, although there is concern for bibasilar pneumonia as well. The previously noted upper lobe ground-glass opacity has resolved bilaterally. 4 mm nodular opacity lateral segment right middle lobe. Followup of this nodular opacity should be based on Fleischner Society guidelines. If the patient is at high risk for bronchogenic carcinoma, follow-up chest CT at 1 year is recommended. If the patient is at low risk, no follow-up is needed. This recommendation follows the consensus statement: Guidelines for Management of Small Pulmonary Nodules Detected on CT Scans: A Statement from the [place] as published in Radiology 2005; 237:395-400. Cardiomegaly with small pericardial effusion. Scattered foci of coronary artery calcification. Small mediastinal lymph nodes without adenopathy by size criteria. Electronically Signed   By: Lowella Grip III M.D.   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  Hospital acquired pneumonia with likely parapneumonic effusions: Completed 7 days of ceftazidime on 12/16. She has bilateral pleural effusions. Previously discussed with pulmonology and CT surgery - poor candidate for surgical intervention or chest tubes at this time. -BCx from 12/17 growing  coag-negative Staph in 2/2. Possible sources are HAP/parapneumonic effusion vs urinary tract -F/u UCx - showed multiple species present, both GPC and yeast - will f/u on speciation -On vancomycin and ceftazidime. Vanc adequately covers coag-neg staph on BCx but does have recent  h/o VRE UTI - will follow UCx -Follow-up BCx to document clearance -Pulmonology reconsulted - appreciate recs. Talked with IR 12/20 who feel drainage would not be feasible at this time. Will continue with prolonged abx course currently. May require chest tube drainage in the future if clinical course changes.  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 Platelets increased from <5 to 140k. Currently stable but low counts (30s-50s). No clinically significant bleeding currently. -Heme following, appreciate recommendations -NPlate weekly -s/p 2 courses of Dexamethasone 20mg  BID x 4d, finished 12/20 -Continue Plaquenil 400mg  daily -Will need to discuss with rheumatology possible escalation of therapy for lupus.  Psychosis, Depression: She had acute confusion and hallucinations possibly 2/2 UTI. Her symptoms improved but worsened 12/9. 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 Low suspicion for Lupus cerebritis. Psychosis resolved. -Continue olanzapine 5mg  BID -Continue duloxetine 40mg  QD  ESRD on HD, s/p Left thigh AVG:AVG placed 01/04/2015. Back on her outpatient TTS schedule. She has some left leg edema likely 2/2 AVG. Venous duplex with post surgical changes and no DVT. -Midodrine 5mg  BID -Nephrology following, appreciate recs -HD per nephrology  Acute on chronic anemia: Hgb stable -Continue to monitor. Transfuse prn for hgb <7.  Hx recent R. MCA Embolic CVA:  -Daily PT/INR -Coumadin held while plt <50.  Deconditioning -Continue PT -Currently not a CIR candidate; will need SNF placement  Hx SVT/Tachycardia: Continue  Toprol-XL 12.5mg  daily  FEN: Renal VTE ppx: SCDs  FULL CODE  Dispo: Disposition is deferred at this time, awaiting improvement of current medical problems.   The patient does have a current PCP Lin Landsman, MD) and does not need an [facility] hospital follow-up appointment after discharge.  The patient does not have transportation limitations that hinder transportation to clinic appointments   LOS: 29 days   Norval Gable, MD 03/20/2015, 10:35 AM

## 2015-03-20 NOTE — Progress Notes (Signed)
Occupational Therapy Treatment Patient Details Name: [name] MRN: [id number] DOB: 01/20/1976 Today's Date: 03/20/2015    History of present illness Pt is a 39 y.o. F from CIR who has had several recent hospitalizations.  She had a Rt MCA infarction and developed pseudomonal bacteremia from Rt femoral HD line which was treated.  Was doing well in CIR until she acutely developed severe confusion and hallucinations following HD sessions, likely a result of sepsis from Lt ant thigh abscess. Pt's additional PMH includes lupus, fibromyalgia, anemia.   OT comments  Pt making progress toward goals (goals updated). Sat EOB @ 20 for ADL with S. Discussed need for pt to get OOB with nsg today. Pt in agreement. Pt stated the reason she has a hard time sitting for 3 hours is because it "hurts her sore bottom".  Pt needs post acute rehab. Will follow acutely.  Follow Up Recommendations  Supervision/Assistance - 24 hour;SNF    Equipment Recommendations  Tub/shower bench;Wheelchair (measurements OT);Hospital bed    Recommendations for Other Services      Precautions / Restrictions Precautions Precautions: Fall;Other (comment) (skin breakdown) Restrictions Weight Bearing Restrictions: No       Mobility Bed Mobility Overal bed mobility: Needs Assistance     Sidelying to sit: Mod assist     Sit to sidelying: Min assist (to lift ) General bed mobility comments: to lift RLE back onto bed  Transfers                 General transfer comment: Practiced sit - stand from bed. Able to rock hips off bed, but unable to achieve upright standing. Will assess use of stedy next visit with ADL    Balance     Sitting balance-Leahy Scale: Good                             ADL Overall ADL's : Needs assistance/impaired     Grooming: Set up;Sitting   Upper Body Bathing: Set up;Supervision/ safety;Sitting   Lower Body Bathing: Minimal assistance;Sitting/lateral leans;Bed  level Lower Body Bathing Details (indicate cue type and reason): Coleted majority of bath EOB, then returned to bed level to compelte pericare Upper Body Dressing : Supervision/safety;Set up;Sitting                     General ADL Comments: Completed majority of bath sitting EOB. Encouraged pt to complete tasks independently.      Vision                     Perception     Praxis      Cognition   Behavior During Therapy: Flat affect Overall Cognitive Status: Within Functional Limits for tasks assessed                  General Comments: Pt with delay in processing; Did not remember D/C plan    Extremity/Trunk Assessment   general BUE weakness. RUE stronger than L            Exercises Other Exercises Other Exercises: BUE AROM shoulder FF;ER;AB;AD x 10 reps Other Exercises: Bridging activities at bed level with trunk toation to work on core strengthening   Shoulder Instructions       General Comments      Pertinent Vitals/ Pain       Pain Assessment: Faces Faces Pain Scale: Hurts little more Pain Location: buttocks Pain Descriptors /  Indicators: Grimacing Pain Intervention(s): Limited activity within patient's tolerance  Home Living                                          Prior Functioning/Environment              Frequency Min 2X/week     Progress Toward Goals  OT Goals(current goals can now be found in the care plan section)  Progress towards OT goals: Progressing toward goals (goals updated)  Acute Rehab OT Goals Patient Stated Goal: Be able to walk OT Goal Formulation: With patient Time For Goal Achievement: 04/03/15 Potential to Achieve Goals: Good ADL Goals Pt Will Transfer to Toilet: with min assist;stand pivot transfer;bedside commode;with +2 assist Pt Will Perform Toileting - Clothing Manipulation and hygiene: with min assist;sitting/lateral leans Pt/caregiver will Perform Home Exercise Program:  Both right and left upper extremity;Increased strength;With theraband;With written HEP provided (level 1)  Plan Discharge plan needs to be updated  - REc Rehab at SNF   Co-evaluation    PT/OT/SLP Co-Evaluation/Treatment:  (for mobility OOB)            End of Session     Activity Tolerance Patient tolerated treatment well   Patient Left in bed;with call bell/phone within reach;with family/visitor present   Nurse Communication Mobility status        Time: [id number] OT Time Calculation (min): 32 min  Charges:    Fonnie Crookshanks,HILLARY 03/20/2015, 10:56 AM   Maurie Boettcher, OTR/L  [id number] 03/20/2015 [place], OTR/L  [id number] 03/20/2015

## 2015-03-20 NOTE — Progress Notes (Signed)
Pharmacy Antibiotic Follow-up Note [name] is a 39 y.o. year-old female admitted on 02/19/2015.  The patient is currently on day 3 (restart) of Ceftazidime and vancomycin for HAP and coag (-) staph bacteremia in setting of ESRD, present inpatient schedule is [facility]  Assessment/Plan: 1. Continue Ceftazidime 2 grams IV and vancomycin 750 mg IV both given during HD on [facility] (as only access as present) 2. Obtain vancomycin level as feasible based on access issues and adjust doses as needed 3. Following with you daily; notes prn   Temp (24hrs), Avg:97.9 F (36.6 C), Min:97.5 F (36.4 C), Max:98.7 F (37.1 C)   Recent Labs Lab 03/16/15 0510 03/17/15 0540 03/18/15 0610 03/19/15 0749 03/20/15 0628  WBC 11.8* 11.4* 14.2* 15.1* 11.4*    Recent Labs Lab 03/14/15 0615 03/16/15 1337 03/17/15 0540 03/19/15 0748  CREATININE 3.60* 3.87* 2.25* 3.81*    Antimicrobials this admission: Vanc 11/22 - 11/25; 12/10 x 1 dose; 12/18>>  Ceftaz 11/22 - 11/25; 12/10 - 12/16 12/17>>  Levo PO 12/17 x1  Augmentin PO 12/17 x1  Unasyn 11/25 - 12/5  Levels/dose changes this admission: None relevant to present dosing  Microbiology results: 12/19 BCx: ngtd 12/19 UCx: yeast  12/18 Pleural fluid: 12/18 UCx: yeast and GPC 12/17 blood: coag neg staph R to oxacillin 12/12 Pleural fluid: ngtd 12/10 MRSA PCR: neg 11/30 BCx: ngF 11/30 UCx: Acinetobacter calcoaceticus/baumannii complex (S- Unasyn, Imi, Gent but MIC = 2, R-ceftaz, septra) and VRE (S-Unasyn, Amp, linezolid) 11/24 BCx: ngF 11/23 BCx: ngF   Thank you for allowing pharmacy to be a part of this patient's care.  Vincenza Hews, PharmD, BCPS 03/20/2015, 9:11 AM Pager: [phone]

## 2015-03-20 NOTE — Progress Notes (Signed)
ANTICOAGULATION CONSULT NOTE - Follow Up Consult  Pharmacy Consult for warfarin Indication: stroke and hypercoaguable state   Labs:  Recent Labs  03/18/15 0610 03/19/15 0600 03/19/15 0748 03/19/15 0749 03/20/15 0628  HGB 10.2*  --   --  9.8* 10.1*  HCT 32.4*  --   --  29.6* 31.1*  PLT 27*  --   --  43* 58*  LABPROT 20.3* 22.3*  --   --  21.2*  INR 1.74* 1.97*  --   --  1.84*  CREATININE  --   --  3.81*  --   --    Assessment: 39 yr old female with ESRD on Coumadin for history of SLE, stroke, and hypercoagulable state. She had been on Coumadin 5 mg daily from 11/16-11/22/16, with gradual rise to therapeutic INR after 5 days. Warfarin has been started and stopped numerous times over admission due to thrombocytopenia. Pt restarted on Decadron and PLTs 50K; primary team would like to resume therapy with coumadin 12/21; INR 1.84 with last dose was on 12/15. H/H stable.  Goal of Therapy:  INR 2-3 Monitor platelets by anticoagulation protocol: Yes   Plan:  1. Warfarin 2.5 mg tonight x 1 2. Daily INR, CBC Q72H  [name], PharmD, BCPS 03/20/2015, 11:39 AM Pager: [phone]

## 2015-03-20 NOTE — Progress Notes (Signed)
Nutrition Follow-up  DOCUMENTATION CODES:   Obesity unspecified  INTERVENTION:   -Continue 30 ml Prostat BID daily -Continue liberalized diet (regular) -Decrease Boost Breeze po to daily, each supplement provides 250 kcal and 9 grams of protein -Provide nourishments between meals -Strongly consider nutrition support or appetite stimulant, due to prolonged poor po intake -Continue renal MVI  NUTRITION DIAGNOSIS:   Increased nutrient needs related to chronic illness as evidenced by estimated needs.  Ongoing  GOAL:   Patient will meet greater than or equal to 90% of their needs  Unmet  MONITOR:   PO intake, Supplement acceptance, Labs, Weight trends, I & O's  REASON FOR ASSESSMENT:   Malnutrition Screening Tool    ASSESSMENT:   39 y.o. 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 Per the rehab physician and nursing staff, the patient started stating that there was someone else in the room telling her what to do. This has been going on for the past 24 hours or so and seems to be getting worse. This is also accompanied by tachycardia and low grade fever.   Pt sleeping soundly at time of visit. Unable to arouse pt.   Appetite remains very poor. Meal completion 25%. Noted that pt's diet has been liberalized to regular, as of 03/16/15, however pt continues to eat very poorly. She is refusing both Prostat and Boost Breeze supplements.   Reviewed [facility] note from 03/14/15; pt has no pressure injuries.   Pt s/p rt thoracentesis by IR on 03/17/15; per IR PA note, effuson has been loculated. Reviewed CCM note from 03/19/15; recommending CT guided drainage or chest tube due to rt pleural effusion.  Nursing and PT working with pt, due to deconditioning. PT recommending SNF placement (pt is now agreeable). Pt must be able to sit in chair for outpatient HD.   Labs  reviewed.   Diet Order:  Diet regular Room service appropriate?: Yes; Fluid consistency:: Thin; Fluid restriction:: 1200 mL Fluid  Skin:  Reviewed, no issues  Last BM:  03/09/15  Height:   Ht Readings from Last 1 Encounters:  02/22/15 5\' 3"  (1.6 m)    Weight:   Wt Readings from Last 1 Encounters:  03/20/15 166 lb 12.8 oz (75.66 kg)    Ideal Body Weight:  52.27 kg  BMI:  Body mass index is 29.55 kg/(m^2).  Estimated Nutritional Needs:   Kcal:  [id number]  Protein:  105-120 grams  Fluid:  Per MD  EDUCATION NEEDS:   No education needs identified at this time  [name], RD, LDN, CDE Pager: [phone] After hours Pager: [phone]

## 2015-03-20 NOTE — Progress Notes (Signed)
Subjective: Interval History: has no complaint , feeling some better.  Objective: Vital signs in last 24 hours: Temp:  [97.5 F (36.4 C)-98.7 F (37.1 C)] 97.6 F (36.4 C) (12/21 0629) Pulse Rate:  [67-100] 67 (12/21 0629) Resp:  [13-20] 18 (12/21 0629) BP: (65-132)/(42-78) 114/69 mmHg (12/21 0629) SpO2:  [96 %-100 %] 100 % (12/21 0629) Weight:  [75.6 kg (166 lb 10.7 oz)-75.66 kg (166 lb 12.8 oz)] 75.66 kg (166 lb 12.8 oz) (12/21 [id number]) Weight change: -1.6 kg (-3 lb 8.4 oz)  Intake/Output from previous day: 12/20 0701 - 12/21 0700 In: 430 [P.O.:120; IV Piggyback:310] Out: 1759  Intake/Output this shift:    General appearance: cooperative, moderately obese and soft spoken Resp: diminished breath sounds bilaterally and rales bibasilar Cardio: S1, S2 normal and systolic murmur: holosystolic 2/6, blowing at apex GI: obese, pos bs, liver down 5 cm Extremities: AVG L groin Skin: suprefic decub sacral  Lab Results:  Recent Labs  03/19/15 0749 03/20/15 0628  WBC 15.1* 11.4*  HGB 9.8* 10.1*  HCT 29.6* 31.1*  PLT 43* PENDING   BMET:  Recent Labs  03/19/15 0748  NA 129*  K 4.9  CL 95*  CO2 24  GLUCOSE 100*  BUN 26*  CREATININE 3.81*  CALCIUM 9.1   No results for input(s): PTH in the last 72 hours. Iron Studies: No results for input(s): IRON, TIBC, TRANSFERRIN, FERRITIN in the last 72 hours.  Studies/Results: Ct Chest Wo Contrast  03/18/2015  CLINICAL DATA:  Shortness of Breath EXAM: CT CHEST WITHOUT CONTRAST TECHNIQUE: Multidetector CT imaging of the chest was performed following the standard protocol without IV contrast. COMPARISON:  Chest CT March 10, 2015; chest radiograph March 17, 2015 FINDINGS: Mediastinum/Lymph Nodes: Visualized thyroid appears normal. There are multiple small mediastinal lymph nodes without frank adenopathy. There is a fairly small pericardial effusion. There are scattered foci of coronary artery calcification. There is no thoracic aortic  aneurysm. Visualized great vessels appear unremarkable on this noncontrast enhanced study. Heart is mildly enlarged. Lungs/Pleura: There are persistent moderate pleural effusions bilaterally with bibasilar consolidation. The ground-glass opacity noted in the upper lobes on recent prior study has resolved. There is no new opacity in the lung parenchyma. On axial slice 25 series 3, there is a 4 mm nodular opacity in the lateral segment right middle lobe. Upper abdomen: No focal lesions are identified in the visualized upper abdomen region. Musculoskeletal: No blastic or lytic bone lesions. IMPRESSION: Moderate pleural effusions bilaterally with stable bibasilar consolidation. Some of this consolidation is new atelectatic in etiology, although there is concern for bibasilar pneumonia as well. The previously noted upper lobe ground-glass opacity has resolved bilaterally. 4 mm nodular opacity lateral segment right middle lobe. Followup of this nodular opacity should be based on Fleischner Society guidelines. If the patient is at high risk for bronchogenic carcinoma, follow-up chest CT at 1 year is recommended. If the patient is at low risk, no follow-up is needed. This recommendation follows the consensus statement: Guidelines for Management of Small Pulmonary Nodules Detected on CT Scans: A Statement from the [employer] as published in Radiology 2005; 237:395-400. Cardiomegaly with small pericardial effusion. Scattered foci of coronary artery calcification. Small mediastinal lymph nodes without adenopathy by size criteria. Electronically Signed   By: Lowella Grip III M.D.   On: 03/18/2015 18:48    I have reviewed the patient's current medications.  Assessment/Plan: 1 ESRD Hd TTS did well yest and vol lowered to assist resp status 2 SLE on  immunosuppress 3 Anemia stable on esa/fe 4 low ptlt on Deca 5 HPTH 6 obesity  7 Debill 8 CVA 9 endocarditis 10 Infx   Had Pseudo cath sepis, then  Acineto/enterococcus prob urine, now staph in blood ?lung, and yeast in urine 11  Pleural effusions ? Drain P HD, esa, Fe, deca, AB, mobilize to HD    LOS: 29 days   Frederika Hukill L 03/20/2015,8:15 AM

## 2015-03-21 LAB — CBC
HCT: 33.7 % — ABNORMAL LOW (ref 36.0–46.0)
HEMOGLOBIN: 10.8 g/dL — AB (ref 12.0–15.0)
MCH: 30.2 pg (ref 26.0–34.0)
MCHC: 32 g/dL (ref 30.0–36.0)
MCV: 94.1 fL (ref 78.0–100.0)
Platelets: 109 10*3/uL — ABNORMAL LOW (ref 150–400)
RBC: 3.58 MIL/uL — AB (ref 3.87–5.11)
RDW: 18.4 % — ABNORMAL HIGH (ref 11.5–15.5)
WBC: 7.4 10*3/uL (ref 4.0–10.5)

## 2015-03-21 LAB — RENAL FUNCTION PANEL
ALBUMIN: 2 g/dL — AB (ref 3.5–5.0)
ANION GAP: 7 (ref 5–15)
BUN: 22 mg/dL — ABNORMAL HIGH (ref 6–20)
CALCIUM: 9 mg/dL (ref 8.9–10.3)
CO2: 29 mmol/L (ref 22–32)
Chloride: 96 mmol/L — ABNORMAL LOW (ref 101–111)
Creatinine, Ser: 3.01 mg/dL — ABNORMAL HIGH (ref 0.44–1.00)
GFR calc non Af Amer: 18 mL/min — ABNORMAL LOW (ref >60)
GFR, EST AFRICAN AMERICAN: 21 mL/min — AB (ref >60)
Glucose, Bld: 81 mg/dL (ref 65–99)
PHOSPHORUS: 2.8 mg/dL (ref 2.5–4.6)
POTASSIUM: 3.5 mmol/L (ref 3.5–5.1)
SODIUM: 132 mmol/L — AB (ref 135–145)

## 2015-03-21 LAB — CBC WITH DIFFERENTIAL/PLATELET
Basophils Absolute: 0 10*3/uL (ref 0.0–0.1)
Basophils Relative: 0 %
EOS ABS: 0 10*3/uL (ref 0.0–0.7)
Eosinophils Relative: 0 %
HEMATOCRIT: 34 % — AB (ref 36.0–46.0)
HEMOGLOBIN: 10.8 g/dL — AB (ref 12.0–15.0)
LYMPHS ABS: 0.8 10*3/uL (ref 0.7–4.0)
Lymphocytes Relative: 13 %
MCH: 30.3 pg (ref 26.0–34.0)
MCHC: 31.8 g/dL (ref 30.0–36.0)
MCV: 95.5 fL (ref 78.0–100.0)
Monocytes Absolute: 0.4 10*3/uL (ref 0.1–1.0)
Monocytes Relative: 6 %
NEUTROS ABS: 5 10*3/uL (ref 1.7–7.7)
NEUTROS PCT: 80 %
Platelets: 84 10*3/uL — ABNORMAL LOW (ref 150–400)
RBC: 3.56 MIL/uL — AB (ref 3.87–5.11)
RDW: 18.5 % — ABNORMAL HIGH (ref 11.5–15.5)
WBC: 6.2 10*3/uL (ref 4.0–10.5)

## 2015-03-21 LAB — PROTIME-INR
INR: 1.88 — ABNORMAL HIGH (ref 0.00–1.49)
Prothrombin Time: 21.6 seconds — ABNORMAL HIGH (ref 11.6–15.2)

## 2015-03-21 LAB — PHOSPHORUS: PHOSPHORUS: 2.9 mg/dL (ref 2.5–4.6)

## 2015-03-21 MED ORDER — MIDODRINE HCL 5 MG PO TABS
ORAL_TABLET | ORAL | Status: AC
  Filled 2015-03-21: qty 1

## 2015-03-21 MED ORDER — DARBEPOETIN ALFA 200 MCG/0.4ML IJ SOSY
PREFILLED_SYRINGE | INTRAMUSCULAR | Status: AC
  Filled 2015-03-21: qty 0.4

## 2015-03-21 MED ORDER — HEPARIN SODIUM (PORCINE) 1000 UNIT/ML DIALYSIS
1000.0000 [IU] | INTRAMUSCULAR | Status: DC | PRN
  Filled 2015-03-21: qty 1

## 2015-03-21 MED ORDER — LIDOCAINE-PRILOCAINE 2.5-2.5 % EX CREA
1.0000 "application " | TOPICAL_CREAM | CUTANEOUS | Status: DC | PRN
  Filled 2015-03-21: qty 5

## 2015-03-21 MED ORDER — SODIUM CHLORIDE 0.9 % IV SOLN: 100.0000 mL | INTRAVENOUS | Status: DC | PRN

## 2015-03-21 MED ORDER — PENTAFLUOROPROP-TETRAFLUOROETH EX AERO: 1.0000 "application " | INHALATION_SPRAY | CUTANEOUS | Status: DC | PRN

## 2015-03-21 MED ORDER — LIDOCAINE HCL (PF) 1 % IJ SOLN
5.0000 mL | INTRAMUSCULAR | Status: DC | PRN
  Filled 2015-03-21: qty 5

## 2015-03-21 MED ORDER — ALTEPLASE 2 MG IJ SOLR
2.0000 mg | Freq: Once | INTRAMUSCULAR | Status: DC | PRN
  Filled 2015-03-21: qty 2

## 2015-03-21 NOTE — Procedures (Signed)
I have seen and examined this patient and agree with the plan of care . Patient was seen on dialysis.  K 3.5   Hb [id number]  Complicated by  HCAP   --- Pleural effusion    Coag neg Staph  BC      12/17      Vancomycin and Fortaz   Recurrent  Polymicrobial UTI   --- Yeast    Enterococcus and GNR    Appears to be doing well on dialysis   [facility] W 03/21/2015, 9:40 AM

## 2015-03-21 NOTE — Progress Notes (Signed)
Subjective: [name] had no acute events overnight. Currently, she has no complaints other than fatigue. She denies fevers, shortness of breath, cough, chest pain, or any other symptoms at this time.  Objective: Vital signs in last 24 hours: Filed Vitals:   03/21/15 0554 03/21/15 0744 03/21/15 0758 03/21/15 0830  BP: 150/73 132/79 118/74 97/77  Pulse: 71 85 84 104  Temp: 97.3 F (36.3 C)     TempSrc: Oral     Resp: 18 17 15 21   Height:      Weight:      SpO2: 90%      Weight change:   Intake/Output Summary (Last 24 hours) at 03/21/15 0836 Last data filed at 03/20/15 1918  Gross per 24 hour  Intake    363 ml  Output      0 ml  Net    363 ml   General Apperance: NAD HEENT: Normocephalic, atraumatic, PERRL, EOMI, anicteric sclera Neck: Supple, trachea midline Lungs: Decreased breath sounds at right lower base. Clear to auscultation otherwise. Heart: Regular rate and rhythm Abdomen: Soft, nontender, nondistended, no rebound/guarding Extremities: Warm and well perfused, 2+ edema of the left thigh around graft site, 1+ distal LLE edema. Bandages around graft do not have any evidence of bleeding. Pulses: 1+ throughout Skin: No rashes or lesions Neurologic: Alert and oriented x 3. No gross deficits. Psych: Flat affect but appropriate.  Lab Results: Basic Metabolic Panel:  Recent Labs Lab 03/17/15 0540 03/19/15 0748  NA 131* 129*  K 4.1 4.9  CL 95* 95*  CO2 29 24  GLUCOSE 167* 100*  BUN 6 26*  CREATININE 2.25* 3.81*  CALCIUM 8.3* 9.1  PHOS 1.5* 2.8   Liver Function Tests:  Recent Labs Lab 03/17/15 0540 03/17/15 1413 03/19/15 0748  PROT  --  6.7  --   ALBUMIN 2.0*  --  1.9*   CBC:  Recent Labs Lab 03/20/15 0628 03/21/15 0645  WBC 11.4* 6.2  NEUTROABS 10.5* 5.0  HGB 10.1* 10.8*  HCT 31.1* 34.0*  MCV 94.8 95.5  PLT 58* 84*   Coagulation:  Recent Labs Lab 03/18/15 0610 03/19/15 0600 03/20/15 0628 03/21/15 0645  LABPROT 20.3* 22.3* 21.2*  21.6*  INR 1.74* 1.97* 1.84* 1.88*    Micro Results: Recent Results (from the past 240 hour(s))  Culture, body fluid-bottle     Status: None   Collection Time: 03/11/15  2:40 PM  Result Value Ref Range Status   Specimen Description FLUID LEFT PLEURAL  Final   Special Requests NONE  Final   Culture NO GROWTH 5 DAYS  Final   Report Status 03/16/2015 FINAL  Final  Gram stain     Status: None   Collection Time: 03/11/15  2:40 PM  Result Value Ref Range Status   Specimen Description FLUID LEFT PLEURAL  Final   Special Requests NONE  Final   Gram Stain   Final    MODERATE WBC PRESENT,BOTH PMN AND MONONUCLEAR NO ORGANISMS SEEN    Report Status 03/11/2015 FINAL  Final  Culture, blood (routine x 2)     Status: None   Collection Time: 03/16/15 10:45 AM  Result Value Ref Range Status   Specimen Description BLOOD RIGHT HAND  Final   Special Requests IN PEDIATRIC BOTTLE 0.5CC  Final   Culture  Setup Time   Final    GRAM POSITIVE COCCI IN CLUSTERS AEROBIC BOTTLE ONLY CRITICAL RESULT CALLED TO, READ BACK BY AND VERIFIED WITH: J ZHU 03/17/15 @ 0730  M VESTAL    Culture STAPHYLOCOCCUS SPECIES (COAGULASE NEGATIVE)  Final   Report Status 03/19/2015 FINAL  Final   Organism ID, Bacteria STAPHYLOCOCCUS SPECIES (COAGULASE NEGATIVE)  Final      Susceptibility   Staphylococcus species (coagulase negative) - MIC*    CIPROFLOXACIN >=8 RESISTANT Resistant     ERYTHROMYCIN >=8 RESISTANT Resistant     GENTAMICIN >=16 RESISTANT Resistant     OXACILLIN >=4 RESISTANT Resistant     TETRACYCLINE >=16 RESISTANT Resistant     VANCOMYCIN 1 SENSITIVE Sensitive     TRIMETH/SULFA 160 RESISTANT Resistant     CLINDAMYCIN >=8 RESISTANT Resistant     RIFAMPIN <=0.5 SENSITIVE Sensitive     Inducible Clindamycin NEGATIVE Sensitive     * STAPHYLOCOCCUS SPECIES (COAGULASE NEGATIVE)  Culture, blood (routine x 2)     Status: None   Collection Time: 03/16/15 11:25 AM  Result Value Ref Range Status   Specimen  Description BLOOD RIGHT FOREARM  Final   Special Requests BOTTLES DRAWN AEROBIC AND ANAEROBIC 5CC  Final   Culture  Setup Time   Final    GRAM POSITIVE COCCI IN CLUSTERS ANAEROBIC BOTTLE ONLY CRITICAL RESULT CALLED TO, READ BACK BY AND VERIFIED WITH: S LEE 03/17/15 @ 68 M VESTAL    Culture   Final    STAPHYLOCOCCUS SPECIES (COAGULASE NEGATIVE) SUSCEPTIBILITIES PERFORMED ON PREVIOUS CULTURE WITHIN THE LAST 5 DAYS.    Report Status 03/19/2015 FINAL  Final  Gram stain     Status: None   Collection Time: 03/17/15 10:27 AM  Result Value Ref Range Status   Specimen Description FLUID PLEURAL RIGHT  Final   Special Requests NONE  Final   Gram Stain   Final    RARE WBC PRESENT, PREDOMINANTLY MONONUCLEAR NO ORGANISMS SEEN    Report Status 03/17/2015 FINAL  Final  AFB culture with smear     Status: None (Preliminary result)   Collection Time: 03/17/15 10:27 AM  Result Value Ref Range Status   Specimen Description FLUID PLEURAL RIGHT  Final   Special Requests NONE  Final   Acid Fast Smear   Final    NO ACID FAST BACILLI SEEN Performed at [facility]    Culture   Final    CULTURE WILL BE EXAMINED FOR 6 WEEKS BEFORE ISSUING A FINAL REPORT Performed at [facility]    Report Status PENDING  Incomplete  Culture, body fluid-bottle     Status: None (Preliminary result)   Collection Time: 03/17/15 10:27 AM  Result Value Ref Range Status   Specimen Description FLUID PLEURAL RIGHT  Final   Special Requests NONE  Final   Culture NO GROWTH 3 DAYS  Final   Report Status PENDING  Incomplete  Culture, Urine     Status: None (Preliminary result)   Collection Time: 03/17/15 11:21 AM  Result Value Ref Range Status   Specimen Description URINE, CATHETERIZED  Final   Special Requests NONE  Final   Culture   Final    >=100,000 COLONIES/mL YEAST 50,000 COLONIES/mL ENTEROCOCCUS SPECIES CULTURE REINCUBATED FOR BETTER GROWTH    Report Status PENDING  Incomplete  Blood culture  (routine x 2)     Status: None (Preliminary result)   Collection Time: 03/18/15 12:39 PM  Result Value Ref Range Status   Specimen Description BLOOD RIGHT HAND  Final   Special Requests IN PEDIATRIC BOTTLE 1MLS  Final   Culture NO GROWTH 2 DAYS  Final   Report Status PENDING  Incomplete  Blood culture (routine x 2)     Status: None (Preliminary result)   Collection Time: 03/18/15  1:02 PM  Result Value Ref Range Status   Specimen Description BLOOD RIGHT HAND  Final   Special Requests IN PEDIATRIC BOTTLE 3CCS  Final   Culture NO GROWTH 2 DAYS  Final   Report Status PENDING  Incomplete  Culture, Urine     Status: None (Preliminary result)   Collection Time: 03/18/15  3:23 PM  Result Value Ref Range Status   Specimen Description URINE, RANDOM  Final   Special Requests vancomycin, ceftazidime Immunocompromised  Final   Culture   Final    >=100,000 COLONIES/mL YEAST 20,000 COLONIES/mL ENTEROCOCCUS SPECIES    Report Status PENDING  Incomplete   Studies/Results: No results found.  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  Hospital acquired pneumonia with likely parapneumonic effusions: Completed 7 days of ceftazidime on 12/16. She has bilateral pleural effusions. Previously discussed with pulmonology and CT surgery - poor candidate for surgical intervention or chest tubes at this time. -BCx from 12/17 growing coag-negative Staph in 2/2. Possible sources are HAP/parapneumonic effusion vs urinary tract -On vancomycin and ceftazidime. 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 Talked with IR 12/20 who feel drainage would not be feasible at this time. Will continue with prolonged abx course currently. May require chest tube drainage in the future if clinical course changes.  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 UCx from 12/18 via I&O cath showed >100K yeast (speciation pending), Enterococcus, few GNR's. 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 Platelets increased from <5 to 140k. Currently stable and improving. No clinically significant bleeding currently. -Heme following, appreciate recommendations -NPlate weekly -s/p 2 courses of Dexamethasone 20mg  BID x 4d, finished 12/20 -Continue Plaquenil 400mg  daily -Will need to discuss with rheumatology possible escalation of therapy for lupus.  Psychosis, Depression: She had acute confusion and hallucinations possibly 2/2 UTI. Her symptoms improved but worsened 12/9. 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 Low suspicion for Lupus cerebritis. Psychosis resolved. -Continue olanzapine 5mg  BID -Continue duloxetine 40mg  QD  ESRD on HD, s/p Left thigh AVG:AVG placed 01/04/2015. Back on her outpatient TTS schedule. She has some left leg edema likely 2/2 AVG. Venous duplex with post surgical changes and no DVT. -Midodrine 5mg  BID -Nephrology following, appreciate recs -HD per nephrology  Acute on chronic anemia: Hgb stable -Continue to monitor. Transfuse prn for hgb <7.  Hx recent R. MCA Embolic CVA:  -Daily PT/INR -Coumadin held while plt <50.  Deconditioning -Continue PT -Currently not a CIR candidate; will need SNF  placement  Hx SVT/Tachycardia: Continue Toprol-XL 12.5mg  daily  FEN: Renal VTE ppx: SCDs  FULL CODE  Dispo: Disposition is deferred at this time, awaiting improvement of current medical problems.   The patient does have a  current PCP Lin Landsman, MD) and does not need an [facility] hospital follow-up appointment after discharge.  The patient does not have transportation limitations that hinder transportation to clinic appointments   LOS: 30 days   Norval Gable, MD 03/21/2015, 8:36 AM

## 2015-03-21 NOTE — Discharge Summary (Signed)
Name: [name] MRN: [id number] DOB: 1975-09-12 39 y.o. PCP: Lin Landsman, MD  Date of Admission: 02/19/2015 11:02 AM Date of Discharge: 03/22/2015 Attending Physician: Axel Filler, MD  Discharge Diagnosis: 1. Recurrent polymicrobial UTIs 2. Major depression with psychotic features 3. Lupus-induced thrombocytopenia 4. Hospital-acquired pneumonia with bilateral exudative pleural effusions 5. Coagulase-negative staph bacteremia of unclear origin 6. 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 Deconditioning due to chronic illness and long-term hospitalization  Principal Problem:   Psychosis Active Problems:   Lupus nephritis ([place])   ESRD (end stage renal disease) on dialysis (HCC)   Systemic lupus (HCC)   Acute encephalopathy   Lower abdominal pain   Knee pain   Sepsis secondary to UTI (HCC)   Pain and swelling of left lower leg   Pressure ulcer   Encephalopathy   Thrombocytopenia (HCC)   Acute blood loss anemia   HAP (hospital-acquired pneumonia)   Pleural effusion, bilateral   Pleural effusion exudative   Bacteremia  Discharge Medications:   Medication List    TAKE these medications        atorvastatin 20 MG tablet  Commonly known as:  LIPITOR  Take 1 tablet (20 mg total) by mouth daily at 6 PM.     cefTAZidime 2 g in dextrose 5 % 50 mL  Inject 2 g into the vein Every Tuesday,Thursday,and Saturday with dialysis.     cyclobenzaprine 10 MG tablet  Commonly known as:  FLEXERIL  Take 1 tablet (10 mg total) by mouth at bedtime as needed for muscle spasms.     Darbepoetin Alfa 200 MCG/0.4ML Sosy injection  Commonly known as:  ARANESP  Inject 0.4 mLs (200 mcg total) into the vein every Thursday with hemodialysis.     DULoxetine HCl 40 MG Cpep  Take 40 mg by mouth daily.     feeding supplement (PRO-STAT SUGAR FREE 64) Liqd  Take 30 mLs by mouth daily.     feeding supplement Liqd  Take 1 Container by mouth daily.     ferric  gluconate 125 mg in sodium chloride 0.9 % 100 mL  Inject 125 mg into the vein Every Tuesday,Thursday,and Saturday with dialysis.     hydroxychloroquine 200 MG tablet  Commonly known as:  PLAQUENIL  Take 2 tablets (400 mg total) by mouth daily.     metoprolol succinate 25 MG 24 hr tablet  Commonly known as:  TOPROL-XL  Take 0.5 tablets (12.5 mg total) by mouth daily at 6 PM.     midodrine 5 MG tablet  Commonly known as:  PROAMATINE  Take 1 tablet (5 mg total) by mouth 2 (two) times daily with a meal.     multivitamin Tabs tablet  Take 1 tablet by mouth at bedtime.     OLANZapine zydis 10 MG disintegrating tablet  Commonly known as:  ZYPREXA  Take 1 tablet (10 mg total) by mouth 2 (two) times daily after a meal.     oxyCODONE-acetaminophen 5-325 MG tablet  Commonly known as:  PERCOCET/ROXICET  Take 1 tablet by mouth every 6 (six) hours as needed for severe pain.     pantoprazole 20 MG tablet  Commonly known as:  PROTONIX  Take 1 tablet (20 mg total) by mouth daily.     polyethylene glycol packet  Commonly known as:  MIRALAX / GLYCOLAX  Take 17 g by mouth daily as needed for mild constipation.     trimethobenzamide 300 MG capsule  Commonly known  as:  TIGAN  Take 1 capsule (300 mg total) by mouth every 8 (eight) hours as needed for nausea/vomiting.     TUMS PO  Take 1-2 tablets by mouth daily as needed (heartburn).     Vancomycin 750 MG/150ML Soln  Commonly known as:  VANCOCIN  Inject 150 mLs (750 mg total) into the vein Every Tuesday,Thursday,and Saturday with dialysis.     warfarin 2.5 MG tablet  Commonly known as:  COUMADIN  Take 1 tablet (2.5 mg total) by mouth one time only at 6 PM.        Disposition and follow-up:   Ms.Daritza Sinagra was discharged from [facility] in Stable condition.  At the hospital follow up visit please address:  1.  Improvement of deconditioning, monitoring of platelets, follow-up with rheumatology (Dr. Charlestine Night).   Patient will also need follow up appointment with [facility] resident clinic.  A message was sent to front desk staff to contact patient for appointment.    2.  Labs / imaging needed at time of follow-up: Please check INR on Monday 12/26 and adjust Coumadin dosing as needed.  Weekly CBCs initially for platelet monitoring. Consider bladder ultrasound/imaging if concern for pyocystis. Consider repeat TEE for re-evaluation of valves. Consider repeat CT chest for monitoring of pleural effusions.  3.  Pending labs/ test needing follow-up: None  Follow-up Appointments: Follow-up Information    Follow up with [place].   Why:  [facility]   Contact information:   [address] [place] [address] [phone]       Follow up with [place].   Why:  They will call to schedule an appointment.  If you do not hear anything for 1 week, contact them to schedule follow up.   Contact information:   [address] [place] [id number]     Discharge Instructions    Diet - low sodium heart healthy    Complete by:  As directed      Increase activity slowly    Complete by:  As directed           Consultations: Treatment Team:  Donato Heinz, MD Ambrose Finland, MD Vianne Bulls, MD  Procedures Performed:  Dg Chest 1 View  03/17/2015  CLINICAL DATA:  Status post right-sided thoracentesis EXAM: CHEST  1 VIEW COMPARISON:  03/16/2015 FINDINGS: Cardiac shadow is enlarged. A right-sided thoracentesis has been performed with reduction in right-sided pleural effusion. No pneumothorax is seen. A stable left axillary stent is noted. No bony abnormality is seen. IMPRESSION: No pneumothorax following right thoracentesis. Electronically Signed   By: Inez Catalina M.D.   On: 03/17/2015 10:54   Dg Chest 1 View  03/11/2015  CLINICAL DATA:  39 year old female status post left-sided thoracentesis yielding bloody fluid. EXAM:  CHEST 1 VIEW COMPARISON:  Chest x-ray 03/08/2015. FINDINGS: Moderate bilateral pleural effusions (right greater than left) with associated bibasilar opacities which may reflect areas of atelectasis and/or airspace consolidation no pneumothorax. Perihilar consolidation noted on the prior study appears improved on today's examination. No associated cephalization of the pulmonary vasculature. Heart size is upper limits of normal. Upper mediastinal contours are within normal limits. Atherosclerotic calcifications in the arch of the thoracic aorta. IMPRESSION: 1. No postprocedural pneumothorax. 2. Moderate bilateral pleural effusions (right greater than left) with atelectasis and/or consolidation throughout the lung bases bilaterally. 3. Decreasing perihilar airspace consolidation compared to prior examination. 4. Atherosclerosis. Electronically Signed  By: Vinnie Langton M.D.   On: 03/11/2015 15:27   Dg Abd 1 View  02/21/2015  CLINICAL DATA:  Abdominal pain and diarrhea EXAM: ABDOMEN - 1 VIEW COMPARISON:  CT abdomen and pelvis January 14, 2015 FINDINGS: There is moderate stool in the colon. There is no bowel dilatation or air-fluid level suggesting obstruction. No free air is seen on this supine examination. There are surgical clips overlying the proximal left femur. IMPRESSION: No demonstrable obstruction or free air.  Moderate stool in colon. Electronically Signed   By: Lowella Grip III M.D.   On: 02/21/2015 19:42   Ct Head Wo Contrast  02/22/2015  CLINICAL DATA:  Diffuse body pain, acute onset.  Initial encounter. EXAM: CT HEAD WITHOUT CONTRAST TECHNIQUE: Contiguous axial images were obtained from the base of the skull through the vertex without intravenous contrast. COMPARISON:  CT of the head performed 02/19/2015, and MRI of the brain performed 02/20/2015 FINDINGS: There is an evolving subacute to chronic right MCA territory infarct, as previously noted. A tiny focus of adjacent calcification is  unchanged in appearance. The posterior fossa, including the cerebellum, brainstem and fourth ventricle, is within normal limits. The third and lateral ventricles, and basal ganglia are unremarkable in appearance. No midline shift is seen. There is no evidence of fracture; visualized osseous structures are unremarkable in appearance. The visualized portions of the orbits are within normal limits. The paranasal sinuses and mastoid air cells are well-aerated. No significant soft tissue abnormalities are seen. IMPRESSION: 1. No acute intracranial abnormality seen on CT. 2. Evolving subacute to chronic right MCA territory infarct again noted, relatively unchanged in appearance. Electronically Signed   By: Garald Balding M.D.   On: 02/22/2015 22:13   Ct Chest Wo Contrast  03/18/2015  CLINICAL DATA:  Shortness of Breath EXAM: CT CHEST WITHOUT CONTRAST TECHNIQUE: Multidetector CT imaging of the chest was performed following the standard protocol without IV contrast. COMPARISON:  Chest CT March 10, 2015; chest radiograph March 17, 2015 FINDINGS: Mediastinum/Lymph Nodes: Visualized thyroid appears normal. There are multiple small mediastinal lymph nodes without frank adenopathy. There is a fairly small pericardial effusion. There are scattered foci of coronary artery calcification. There is no thoracic aortic aneurysm. Visualized great vessels appear unremarkable on this noncontrast enhanced study. Heart is mildly enlarged. Lungs/Pleura: There are persistent moderate pleural effusions bilaterally with bibasilar consolidation. The ground-glass opacity noted in the upper lobes on recent prior study has resolved. There is no new opacity in the lung parenchyma. On axial slice 25 series 3, there is a 4 mm nodular opacity in the lateral segment right middle lobe. Upper abdomen: No focal lesions are identified in the visualized upper abdomen region. Musculoskeletal: No blastic or lytic bone lesions. IMPRESSION: Moderate  pleural effusions bilaterally with stable bibasilar consolidation. Some of this consolidation is new atelectatic in etiology, although there is concern for bibasilar pneumonia as well. The previously noted upper lobe ground-glass opacity has resolved bilaterally. 4 mm nodular opacity lateral segment right middle lobe. Followup of this nodular opacity should be based on Fleischner Society guidelines. If the patient is at high risk for bronchogenic carcinoma, follow-up chest CT at 1 year is recommended. If the patient is at low risk, no follow-up is needed. This recommendation follows the consensus statement: Guidelines for Management of Small Pulmonary Nodules Detected on CT Scans: A Statement from the [place] as published in Radiology 2005; 237:395-400. Cardiomegaly with small pericardial effusion. Scattered foci of coronary artery calcification. Small mediastinal lymph  nodes without adenopathy by size criteria. Electronically Signed   By: Lowella Grip III M.D.   On: 03/18/2015 18:48   Ct Chest Wo Contrast  03/10/2015  CLINICAL DATA:  Cough, pulmonary infiltrate, end-stage renal disease EXAM: CT CHEST WITHOUT CONTRAST TECHNIQUE: Multidetector CT imaging of the chest was performed following the standard protocol without IV contrast. COMPARISON:  Images of the lung bases CT scan 01/14/2015 FINDINGS: Study is markedly limited by streak artifacts from patient's large body habitus. Limited study without IV contrast. Images of the thoracic inlet are unremarkable. Central airways are patent. Cardiomegaly is noted. No pericardial effusion. The visualized upper abdomen is unremarkable. No significant hilar adenopathy. There is moderate to large bilateral pleural effusion. There is significant atelectasis bilateral lower lobe posteriorly. Elevation of the right hemidiaphragm is noted. There is nodular mass or adenopathy in right upper lobe medially. Best seen in axial image 16 measures about 3.7 cm. There  is nodular pleural thickening in right lower lobe. Pleural disease cannot be excluded. Further correlation with enhanced study and analysis of pleural fluid is recommended. There is central ground-glass attenuation bilateral upper lobe suspicious for pulmonary edema or less likely alveolar infiltrates. No destructive bony lesions are noted. Sagittal images of the spine shows degenerative changes thoracic spine. Fatty infiltration of the liver is noted. IMPRESSION: Markedly limited study without IV contrast. The study is limited by patient's large body habitus. 1. Bilateral moderate to large size pleural effusion. Bilateral lower lobe significant atelectasis. There is nodular pleural thickening in right lower lobe laterally see axial image 24. Pleural disease or metastatic disease cannot be excluded. 2. There is nodular mass or adenopathy in right upper lobe medially measures at least 3.7 cm. Further correlation with enhanced study is recommended. Pleural fluid analysis also recommended 3. Cardiomegaly is noted. 4. Degenerative changes thoracic spine. 5. There is central ground-glass attenuation bilateral upper lobes. Findings suspicious for pulmonary edema or less likely infiltrates. Electronically Signed   By: Lahoma Crocker M.D.   On: 03/10/2015 14:32   US Renal  02/21/2015  CLINICAL DATA:  Flank pain EXAM: RENAL / URINARY TRACT ULTRASOUND COMPLETE COMPARISON:  CT abdomen and pelvis January 14, 2015 FINDINGS: Right Kidney: Length: 9.1 cm. Echogenicity and renal cortical thickness are within normal limits. No perinephric fluid or hydronephrosis visualized. There is a cyst arising from the upper pole of the right kidney measuring 1.1 x 1.3 x 1.2 cm. There is a second nearby cyst in the upper pole right kidney measuring 1.2 x 0.9 x 1.0 cm. No sonographically demonstrable calculus or ureterectasis. Left Kidney: Length: 9.6 cm. Echogenicity and renal cortical thickness are within normal limits. No mass, perinephric  fluid, or hydronephrosis visualized. No sonographically demonstrable calculus or ureterectasis. Bladder: Appears normal for degree of bladder distention. IMPRESSION: Small right renal cysts.  Study otherwise unremarkable. Electronically Signed   By: Lowella Grip III M.D.   On: 02/21/2015 19:22   Dg Chest Port 1 View  03/16/2015  CLINICAL DATA:  Fever, lupus, stroke, end-stage renal disease, TIA, hypertension, arrhythmias EXAM: PORTABLE CHEST 1 VIEW COMPARISON:  Portable exam 1108 hours compared to 03/11/2015 FINDINGS: Rotation to the LEFT. Enlargement of cardiac silhouette with pulmonary vascular congestion. Mediastinal contours normal. Bibasilar effusions. Atelectasis versus consolidation LEFT lower lobe with minimal atelectasis at RIGHT base. Upper lungs clear. No pneumothorax. Bones unremarkable. IMPRESSION: Enlargement of cardiac silhouette with pulmonary vascular congestion. Bibasilar effusions with minimal RIGHT basilar atelectasis and more pronounced atelectasis versus consolidation in LEFT lower lobe.  Electronically Signed   By: Lavonia Dana M.D.   On: 03/16/2015 11:29   Dg Chest Port 1 View  03/08/2015  CLINICAL DATA:  Intermittent shortness of breath.  Lupus. EXAM: PORTABLE CHEST 1 VIEW COMPARISON:  02/19/2015 FINDINGS: Perihilar airspace opacity and layering pleural effusions which are new. Chronic cardiopericardial enlargement. Stable upper mediastinal contours. Left-sided venous stenting. IMPRESSION: Perihilar airspace disease and layering pleural effusions, new from 02/19/2015 and likely overload/CHF. Electronically Signed   By: Monte Fantasia M.D.   On: 03/08/2015 12:01   Dg Knee Left Port  02/24/2015  CLINICAL DATA:  LEFT knee pain and thigh pain EXAM: PORTABLE LEFT KNEE - 1-2 VIEW COMPARISON:  None. FINDINGS: No fracture of the proximal tibia or distal femur. Patella is normal. No joint effusion. IMPRESSION: No fracture or dislocation.  No joint effusion. Electronically Signed   By:  Suzy Bouchard M.D.   On: 02/24/2015 11:35   US Thoracentesis Asp Pleural Space W/img Guide  03/17/2015  INDICATION: Symptomatic right sided pleural effusion EXAM: US THORACENTESIS ASP PLEURAL SPACE W/IMG GUIDE COMPARISON:  Thoracentesis 03/11/2015. MEDICATIONS: None COMPLICATIONS: None immediate TECHNIQUE: Informed written consent was obtained from the patient after a discussion of the risks, benefits and alternatives to treatment. A timeout was performed prior to the initiation of the procedure. Initial ultrasound scanning demonstrates a right loculated pleural effusion. The lower chest was prepped and draped in the usual sterile fashion. 1% lidocaine was used for local anesthesia. Under direct ultrasound guidance, a 19 gauge, 7-cm, Yueh catheter was introduced. An ultrasound image was saved for documentation purposes. The thoracentesis was performed. The catheter was removed and a dressing was applied. The patient tolerated the procedure well without immediate post procedural complication. The patient was escorted to have an upright chest radiograph. FINDINGS: A total of approximately 60 ml of dark bloody fluid was removed. Requested samples were sent to the laboratory. IMPRESSION: Successful ultrasound-guided right sided thoracentesis yielding 60 ml of pleural fluid. Read By:  Tsosie Billing PA-C Electronically Signed   By: Marybelle Killings M.D.   On: 03/17/2015 10:32   US Thoracentesis Asp Pleural Space W/img Guide  03/11/2015  INDICATION: Symptomatic left sided pleural effusion EXAM: US THORACENTESIS ASP PLEURAL SPACE W/IMG GUIDE COMPARISON:  None. MEDICATIONS: 10 cc 1% lidocaine COMPLICATIONS: None immediate TECHNIQUE: Informed written consent was obtained from the patient after a discussion of the risks, benefits and alternatives to treatment. A timeout was performed prior to the initiation of the procedure. Initial ultrasound scanning demonstrates a left pleural effusion. The lower chest was prepped  and draped in the usual sterile fashion. 1% lidocaine was used for local anesthesia. Under direct ultrasound guidance, a 19 gauge, 7-cm, Yueh catheter was introduced. An ultrasound image was saved for documentation purposes. The thoracentesis was performed. The catheter was removed and a dressing was applied. The patient tolerated the procedure well without immediate post procedural complication. The patient was escorted to have an upright chest radiograph. FINDINGS: A total of approximately 300 cc of bloody fluid was removed. Requested samples were sent to the laboratory. IMPRESSION: Successful ultrasound-guided left sided thoracentesis yielding 300 cc of pleural fluid. Read by:  Lavonia Drafts [facility] Electronically Signed   By: Lucrezia Europe M.D.   On: 03/11/2015 14:59   Admission HPI: Ms. Cleone Thurgood is a 39 y.o. 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 Per the rehab physician  and nursing staff, the patient started stating that there was someone else in the room telling her what to do. This has been going on for the past 24 hours or so and seems to be getting worse. This is also accompanied by tachycardia and low grade fever. On exam, the patient states that her "ex-boyfriend's wife, [name]" is hiding under the bed speaking to her. She is reportedly telling her the healthcare professionals caring for her are lying to her. Otherwise, the patient does not report any other significant symptoms. She says she feels anxious and depressed, has some mild intermittent chest pain and generalized weakness, but states this is not new. She is able to answer questions appropriately, is alert and oriented x2 (could not state the year), but knows that Thanksgiving is next week. Other than auditory hallucinations, her cognition and speech are intact.  Patient has had a very complex history recently with  prolonged hospital admission, HD catheter infection, pseudomonas UTI, recent stroke, and murantic endocarditis. Per CIR physician, patient was supposed to go home today given her significant improvement in rehab, however, now she has significant change in mental status.   CT head performed in CIR this AM shows no acute abnormality, only chronic, right frontal CVA, reported as evolved since previous CT head. EKG shows sinus tachycardia, lactic acid 2.8. Temp this AM 100.4, Pulse 154, BP elevated to 166/82.   Hospital Course by problem list: Principal Problem:   Psychosis Active Problems:   Lupus nephritis ([place])   ESRD (end stage renal disease) on dialysis ([place])   Systemic lupus (HCC)   Acute encephalopathy   Lower abdominal pain   Knee pain   Sepsis secondary to UTI (HCC)   Pain and swelling of left lower leg   Pressure ulcer   Encephalopathy   Thrombocytopenia (HCC)   Acute blood loss anemia   HAP (hospital-acquired pneumonia)   Pleural effusion, bilateral   Pleural effusion exudative   Bacteremia   1. Recurrent polymicrobial UTIs - Patient initially admitted as a transfer from CIR with fever and auditory hallucinations, thought to be due to acute delirium from a UTI, empirically treated with ceftazidime initially x 3 days, but switched to Unasyn as urine cultures done on admission grew >100K VRE as well as >100K Acinetobacter baumanii. She defervesced after 1 day on Unasyn. Blood cultures were negative at that time. She completed 10 days of Unasyn out of her originally-intended 14 days, stopped early due to worsening thrombocytopenia. She did have one more single documented fever of 100.4 two weeks after finishing Unasyn, for which blood cultures grew coagulase-negative staph and urine cultures grew >100K yeast, 20K Enterococcus, and she was continued on ceftazidime and vancomycin (which she was already on at the time for HAP). Given that she is anuric and has a history of recurrent  polymicrobial UTIs, evaluation for pyocystis should be considered if she has another episode of sepsis due to UTI. This would consist of potential bladder irrigation with antimicrobials, typically during dialysis. - Continue Vanc and Ceftazidime with HD for 14 days of treatment with Dec 17 as day 1 of treatment.  Last day of treatment will be December 30.  2. Major depression with psychotic features - Initially admitted as a transfer from inpatient rehab with auditory hallucinations and paranoid delusions such as that the father of her children was hiding in the hospital to harm her, thought to be a result of UTI. She also exhibited a flattened affect as compared to previous encounters,  as well as severe depressive symptoms due to her multiple severe medical issues. She was continued on an antidepressant started in CIR and quetiapine was started. However, these symptoms persisted several days beyond treatment for the UTI. 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 Psychiatry was consulted due to concern for MDD with psychotic features, her duloxetine was increased and she was switched to olanzapine from quetiapine. Her symptoms subsided within the next few days and she had no recurrence for the remaining 3 weeks of the hospitalization. Our plan was to continue the antidepressant and antipsychotic for at least the duration of her acute illness.  3. Lupus-induced thrombocytopenia - As her psychosis started improving, her platelets began to plummet from 120s-140s down to <5 within the span of a few days. She did have clinically significant bleeding from her left thigh AVG without other apparent bleeding. (she dropping hemoglobin at this time as well). Unasyn was stopped after day 10 as it was thought to play a potential role at first. A recent HIT panel had also been negative. She was initially treated with a 4-day course of high-dose dexamethasone for lupus-induced  thrombocytopenia. Hematology was consulted as she did not first respond to [place], and she was then started on IVIG x 5 days and romiplastim. Her platelets did increase temporarily, but dropped back down to 20 without bleeding. She was then treated with a 2nd course of 4-day DMS, and he platelets stabilized and continued to rise. She did not have any recurrent bleeds after the initial AVG bleed. She required a total of 4 U pRPBCs and 4 U platelets, most during dialysis given her ongoing IV access issues.  We will monitor her platelet count off the N-Plate. It would be reasonable to resume this if platelet count falls again now that she has had 2 courses of high-dose dexamethasone.  If platelet count remains stable, continue low-dose warfarin anticoagulation in view of recent embolic stroke.  4. Hospital-acquired pneumonia with bilateral exudative pleural effusions - She developed a productive cough with congestion and shortness of breath but without fever, 3 weeks into this admission. CXR showed bilateral pleural effusions and CT chest confirmed bilateral moderate to large size pleural effusion with bilateral lower lobe significant atelectasis with nodular pleural thickening in right lower lobe and R medial adenopathy. She was started on ceftazidime monotherapy initially x 7 days. Given her immunocompromised status and bilateral HAP, we were concerned for parapneumonic effusions. Pleural fluid analysis yielded 6.7 g of protein. High LDH 1634. Gram stain and AFB stains negative. Cell analysis with rare mononuclear cell. Cytology with a rare benign inflammatory cell. Pleural fluid cultures were negative. Pulmonology, CVTS, and IR were all consulted, but the patient was deemed too high risk at that time for procedural intervention by these services. As she was asymptomatic and there was equivocal evidence that these were in fact parapneumonic vs SLE-related, our plan was to treat her with a prolonged course of  vancomycin and ceftazidime on TTS through HD x 2 weeks. - Continue Vanc and Ceftazidime with HD for 14 days of treatment with Dec 17 as day 1 of treatment.  Last day of treatment will be December 30.  5. Coagulase-negative staph bacteremia of unclear origin, history of endocarditis - After three weeks without fever (after her initial febrile UTI on presentation), and after finishing initial ceftazidime monotherapy for HAP, she did spike an isolated temperature of 100.4 without other symptoms. She was started on ceftazidime and vancomycin. Blood  cultures grew coagulase-negative staph in 2/2 bottles. Urine cultures grew yeast and enterococcus but no coag negative staph.  Pleural fluid cultures yielded no growth. She had no recurrent fevers or symptoms for the remaining several days of her hospitalization. 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 TEE then showed a vegetation on her mitral valve initially felt to be consistent with Rudene Christians nonbacterial endocarditis. Warfarin anticoagulation started. Her pancytopenia improved off CellCept. She was initially treated with vancomycin pending mature blood cultures to rule out bacterial endocarditis. Cultures were negative at 5 days and vancomycin was discontinued. On readmission October 28th for femoral catheter-associated sepsis, MRI of the brain showed new areas of diffusion restriction in the right caudate and lentiform nuclei. Follow-up TEE no longer showed any vegetations. Once again, she was stabilized and transferred to rehabilitation. Given her history of recurrent strokes and endocarditis, a source that must be considered in the future is her mitral valve, if she does present with febrile illness again. She will continue on low-dose warfarin for her previous CVAs in the setting of endocarditis.  6. 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 She continues to develop hypotension during HD, often requiring midodrine. She follows with Dr. Charlestine Night, who was consulted here for the possibility of additional SLE-directed therapies as many of her issues arise either directly from her SLE or from opportunistic infections 2/2 her immunocompromised state. At this time, it was decided that she would continue on Plaquenil given limited benefit of other therapies with her organ involvement, with close follow-up required with her rheumatologist.  7. Deconditioning due to chronic illness and long-term hospitalization - Given she has been either hospitalized or in rehab for over 3 months continuously now, she has become deconditioned and malnourished over this trajectory. SLP, PT/OT were all involved extensively in getting her to be able to at least sit up in a HD chair, which was the main issue prohibiting discharge at the end of this current hospitalization. She was initially adamant about discharge to home, but with CIR not accepting the patient, she was felt to be best suited for further rehabilitation, physically and nutritionally, at a SNF.  Discharge Vitals:   BP 107/63 mmHg  Pulse 90  Temp(Src) 98.4 F (36.9 C) (Oral)  Resp 17  Ht 5\' 3"  (1.6 m)  Wt 167 lb 8.8 oz (76 kg)  BMI 29.69 kg/m2  SpO2 100%  LMP 01/21/2015  Discharge Labs:  Results for orders placed or performed during the hospital encounter of 02/19/15 (from the past 24 hour(s))  Protime-INR     Status: Abnormal   Collection Time: 03/22/15  6:11 AM  Result Value Ref Range   Prothrombin Time 21.9 (H) 11.6 - 15.2 seconds   INR 1.92 (H) 0.00 - 1.49  Procalcitonin     Status: None   Collection Time: 03/22/15  6:11 AM  Result Value Ref Range   Procalcitonin 12.21 ng/mL  CBC with Differential     Status: Abnormal   Collection Time: 03/22/15  6:11 AM  Result Value Ref Range   WBC 7.0 4.0 - 10.5 K/uL     RBC 3.47 (L) 3.87 - 5.11 MIL/uL   Hemoglobin 10.6 (L) 12.0 - 15.0 g/dL   HCT 33.5 (L) 36.0 - 46.0 %   MCV 96.5 78.0 -  100.0 fL   MCH 30.5 26.0 - 34.0 pg   MCHC 31.6 30.0 - 36.0 g/dL   RDW 19.2 (H) 11.5 - 15.5 %   Platelets 110 (L) 150 - 400 K/uL   Neutrophils Relative % 83 %   Neutro Abs 5.8 1.7 - 7.7 K/uL   Lymphocytes Relative 11 %   Lymphs Abs 0.7 0.7 - 4.0 K/uL   Monocytes Relative 6 %   Monocytes Absolute 0.4 0.1 - 1.0 K/uL   Eosinophils Relative 1 %   Eosinophils Absolute 0.1 0.0 - 0.7 K/uL   Basophils Relative 0 %   Basophils Absolute 0.0 0.0 - 0.1 K/uL    Signed: Jule Ser, DO 03/22/2015, 11:05 AM

## 2015-03-21 NOTE — [place] Notes (Signed)
ANTICOAGULATION CONSULT NOTE - Follow Up Consult  Pharmacy Consult for coumadin Indication: hypercoagulable state  Allergies  Allergen Reactions  . Food Swelling    Red peppers    Patient Measurements: Height: 5\' 3"  (160 cm) Weight: 166 lb 12.8 oz (75.66 kg) IBW/kg (Calculated) : 52.4 Heparin Dosing Weight:   Vital Signs: Temp: 97.3 F (36.3 C) (12/22 0554) Temp Source: Oral (12/22 0554) BP: 118/74 mmHg (12/22 0758) Pulse Rate: 84 (12/22 0758)  Labs:  Recent Labs  03/19/15 0600 03/19/15 0748  03/19/15 0749 03/20/15 0628 03/21/15 0645  HGB  --   --   < > 9.8* 10.1* 10.8*  HCT  --   --   --  29.6* 31.1* 34.0*  PLT  --   --   --  43* 58* 84*  LABPROT 22.3*  --   --   --  21.2* 21.6*  INR 1.97*  --   --   --  1.84* 1.88*  CREATININE  --  3.81*  --   --   --   --   < > = values in this interval not displayed.  Estimated Creatinine Clearance: 19.3 mL/min (by C-G formula based on Cr of 3.81).   Medications:  Scheduled:  . sodium chloride   Intravenous Once  . cefTAZidime (FORTAZ)  IV  2 g Intravenous Q T,Th,Sa-HD  . Darbepoetin Alfa  200 mcg Intravenous Q Thu-HD  . DULoxetine  40 mg Oral Daily  . feeding supplement  1 Container Oral Q24H  . feeding supplement (PRO-STAT SUGAR FREE 64)  30 mL Oral Daily  . ferric gluconate (FERRLECIT/NULECIT) IV  125 mg Intravenous Q T,Th,Sa-HD  . hydroxychloroquine  400 mg Oral Daily  . metoprolol succinate  12.5 mg Oral q1800  . midodrine  5 mg Oral BID WC  . multivitamin  1 tablet Oral BID  . OLANZapine zydis  10 mg Oral BID PC  . pantoprazole  40 mg Oral Daily  . polyethylene glycol  17 g Oral Daily  . romiPLOStim  1 mcg/kg Subcutaneous Weekly  . senna-docusate  2 tablet Oral QHS  . sodium chloride  3 mL Intravenous Q12H  . vancomycin  750 mg Intravenous Q T,Th,Sa-HD  . Warfarin - Pharmacist Dosing Inpatient   Does not apply q1800   Infusions:    Assessment: 39 yo female with hypercoagulable state is currently on  subtherapeutic coumadin.  INR today is 1.88.  It was just resumed on 03/20/15.  Goal of Therapy:  INR 2-3 Monitor platelets by anticoagulation protocol: Yes   Plan:  - coumadin 2.5 mg po x1 - INR in am  Carmella Kees, Tsz-Yin 03/21/2015,8:19 AM

## 2015-03-22 LAB — CBC WITH DIFFERENTIAL/PLATELET
BASOS ABS: 0 10*3/uL (ref 0.0–0.1)
BASOS PCT: 0 %
EOS PCT: 1 %
Eosinophils Absolute: 0.1 10*3/uL (ref 0.0–0.7)
HCT: 33.5 % — ABNORMAL LOW (ref 36.0–46.0)
Hemoglobin: 10.6 g/dL — ABNORMAL LOW (ref 12.0–15.0)
LYMPHS PCT: 11 %
Lymphs Abs: 0.7 10*3/uL (ref 0.7–4.0)
MCH: 30.5 pg (ref 26.0–34.0)
MCHC: 31.6 g/dL (ref 30.0–36.0)
MCV: 96.5 fL (ref 78.0–100.0)
MONO ABS: 0.4 10*3/uL (ref 0.1–1.0)
Monocytes Relative: 6 %
NEUTROS ABS: 5.8 10*3/uL (ref 1.7–7.7)
Neutrophils Relative %: 83 %
PLATELETS: 110 10*3/uL — AB (ref 150–400)
RBC: 3.47 MIL/uL — AB (ref 3.87–5.11)
RDW: 19.2 % — AB (ref 11.5–15.5)
WBC: 7 10*3/uL (ref 4.0–10.5)

## 2015-03-22 LAB — URINE CULTURE

## 2015-03-22 LAB — CULTURE, BODY FLUID-BOTTLE: CULTURE: NO GROWTH

## 2015-03-22 LAB — PROTIME-INR
INR: 1.92 — AB (ref 0.00–1.49)
Prothrombin Time: 21.9 seconds — ABNORMAL HIGH (ref 11.6–15.2)

## 2015-03-22 LAB — PROCALCITONIN: PROCALCITONIN: 12.21 ng/mL

## 2015-03-22 MED ORDER — OLANZAPINE 10 MG PO TBDP: 10.0000 mg | ORAL_TABLET | Freq: Two times a day (BID) | ORAL | Status: AC

## 2015-03-22 MED ORDER — VANCOMYCIN HCL IN DEXTROSE 750-5 MG/150ML-% IV SOLN: 750.0000 mg | INTRAVENOUS | Status: DC

## 2015-03-22 MED ORDER — PRO-STAT SUGAR FREE PO LIQD: 30.0000 mL | Freq: Every day | ORAL | Status: AC

## 2015-03-22 MED ORDER — DEXTROSE 5 % IV SOLN: 2.0000 g | INTRAVENOUS | Status: DC

## 2015-03-22 MED ORDER — MIDODRINE HCL 5 MG PO TABS: 5.0000 mg | ORAL_TABLET | Freq: Two times a day (BID) | ORAL | Status: AC

## 2015-03-22 MED ORDER — WARFARIN SODIUM 2.5 MG PO TABS: 2.5000 mg | ORAL_TABLET | Freq: Once | ORAL | Status: DC

## 2015-03-22 MED ORDER — OXYCODONE-ACETAMINOPHEN 5-325 MG PO TABS: 1.0000 | ORAL_TABLET | Freq: Four times a day (QID) | ORAL | Status: AC | PRN

## 2015-03-22 MED ORDER — SODIUM CHLORIDE 0.9 % IV SOLN: 125.0000 mg | INTRAVENOUS | Status: AC

## 2015-03-22 MED ORDER — METOPROLOL SUCCINATE ER 25 MG PO TB24: 12.5000 mg | ORAL_TABLET | Freq: Every day | ORAL | Status: DC

## 2015-03-22 MED ORDER — DULOXETINE HCL 40 MG PO CPEP: 40.0000 mg | ORAL_CAPSULE | Freq: Every day | ORAL | Status: AC

## 2015-03-22 MED ORDER — BOOST / RESOURCE BREEZE PO LIQD: 1.0000 | ORAL | Status: DC

## 2015-03-22 NOTE — [place] Notes (Signed)
ANTICOAGULATION CONSULT NOTE - Follow Up Consult  Pharmacy Consult for coumadin Indication: hypercoagulable state  Allergies  Allergen Reactions  . Food Swelling    Red peppers    Patient Measurements: Height: 5\' 3"  (160 cm) Weight: 167 lb 8.8 oz (76 kg) IBW/kg (Calculated) : 52.4 Heparin Dosing Weight:   Vital Signs: Temp: 98.4 F (36.9 C) (12/23 0523) Temp Source: Oral (12/23 0523) BP: 107/63 mmHg (12/23 0523) Pulse Rate: 90 (12/23 0523)  Labs:  Recent Labs  03/20/15 0628 03/21/15 0645 03/21/15 0812 03/22/15 0611  HGB 10.1* 10.8* 10.8* 10.6*  HCT 31.1* 34.0* 33.7* 33.5*  PLT 58* 84* 109* 110*  LABPROT 21.2* 21.6*  --  21.9*  INR 1.84* 1.88*  --  1.92*  CREATININE  --   --  3.01*  --     Estimated Creatinine Clearance: 24.5 mL/min (by C-G formula based on Cr of 3.01).   Medications:  Scheduled:  . sodium chloride   Intravenous Once  . cefTAZidime (FORTAZ)  IV  2 g Intravenous Q T,Th,Sa-HD  . Darbepoetin Alfa  200 mcg Intravenous Q Thu-HD  . DULoxetine  40 mg Oral Daily  . feeding supplement  1 Container Oral Q24H  . feeding supplement (PRO-STAT SUGAR FREE 64)  30 mL Oral Daily  . ferric gluconate (FERRLECIT/NULECIT) IV  125 mg Intravenous Q T,Th,Sa-HD  . hydroxychloroquine  400 mg Oral Daily  . metoprolol succinate  12.5 mg Oral q1800  . midodrine  5 mg Oral BID WC  . multivitamin  1 tablet Oral BID  . OLANZapine zydis  10 mg Oral BID PC  . pantoprazole  40 mg Oral Daily  . polyethylene glycol  17 g Oral Daily  . romiPLOStim  1 mcg/kg Subcutaneous Weekly  . senna-docusate  2 tablet Oral QHS  . sodium chloride  3 mL Intravenous Q12H  . vancomycin  750 mg Intravenous Q T,Th,Sa-HD  . warfarin  2.5 mg Oral ONCE-1800  . Warfarin - Pharmacist Dosing Inpatient   Does not apply q1800   Infusions:    Assessment: 39 yo female with hypercoagulable state is currently on subtherapeutic coumadin.  INR is up to 1.92 from 1.88 (of note, no dose was given  yesterday) Goal of Therapy:  INR 2-3 Monitor platelets by anticoagulation protocol: Yes   Plan:  - coumadin 2.5 mg po x1 - INR in am  Ervin Rothbauer, Tsz-Yin 03/22/2015,8:10 AM

## 2015-03-22 NOTE — Clinical Social Work Placement (Signed)
   CLINICAL SOCIAL WORK PLACEMENT  NOTE  Date:  03/22/2015  Patient Details  Name: [name] MRN: [id number] Date of Birth: [birth year]  Clinical Social Work is seeking post-discharge placement for this patient at the [place] level of care (*CSW will initial, date and re-position this form in  chart as items are completed):  Yes   Patient/family provided with [place] Work Department's list of facilities offering this level of care within the geographic area requested by the patient (or if unable, by the patient's family).  Yes   Patient/family informed of their freedom to choose among providers that offer the needed level of care, that participate in Medicare, Medicaid or managed care program needed by the patient, have an available bed and are willing to accept the patient.  Yes   Patient/family informed of [place]'s ownership interest in [facility] and [facility], as well as of the fact that they are under no obligation to receive care at these facilities.  PASRR submitted to EDS on 03/18/15     PASRR number received on 03/18/15     Existing PASRR number confirmed on       FL2 transmitted to all facilities in geographic area requested by pt/family on 03/18/15     FL2 transmitted to all facilities within larger geographic area on       Patient informed that his/her managed care company has contracts with or will negotiate with certain facilities, including the following:        Yes   Patient/family informed of bed offers received.  Patient chooses bed at [facility]     Physician recommends and patient chooses bed at      Patient to be transferred to [facility] on 03/22/15.  Patient to be transferred to facility by [facility]     Patient family notified on 03/22/15 of transfer.  Name of family member notified:  Mother, Langley Gauss [phone]     PHYSICIAN Please prepare  prescriptions     Additional Comment:    _______________________________________________ Benard Halsted, [place] 03/22/2015, 11:19 AM

## 2015-03-22 NOTE — [place] Notes (Signed)
Pt prepared for d/c to SNF. IV d/c'd. Skin intact except as charted in most recent assessments. Vitals are stable. Report called to receiving facility. Pt to be transported by ambulance service. 

## 2015-03-22 NOTE — Care Management Note (Signed)
Case Management Note  Patient Details  Name: [name] MRN: [id number] Date of Birth: [birth year]  Subjective/Objective:     Patient is for dc to SNF today, CSW following.               Action/Plan:   Expected Discharge Date:                  Expected Discharge Plan:  Skilled Nursing Facility  In-House Referral:  Clinical Social Work  Discharge planning Services  CM Consult  Post Acute Care Choice:    Choice offered to:     DME Arranged:    DME Agency:     HH Arranged:    [place] Agency:     Status of Service:  Completed, signed off  Medicare Important Message Given:    Date Medicare IM Given:    Medicare IM give by:    Date Additional Medicare IM Given:    Additional Medicare Important Message give by:     If discussed at [place] of Stay Meetings, dates discussed:    Additional Comments:  Zenon Mayo, RN 03/22/2015, 11:14 AM

## 2015-03-22 NOTE — [place] Notes (Signed)
COURTESY NOTE:  Plans for discharge noted.  If possible would have CBC checked weekly and dose Nplate weekly per standard pharmacy protocol.  Please let me know if I can be of further help. Will sign off at this time

## 2015-03-22 NOTE — [place] Notes (Signed)
Subjective: Patient seen and examined this morning on rounds.  No acute events overnight.  No complaints of fever, shortness of breath, cough, chest pain, or other symptoms at this time.  Objective: Vital signs in last 24 hours: Filed Vitals:   03/21/15 1343 03/21/15 1829 03/21/15 2100 03/22/15 0523  BP: 83/50 96/56 104/62 107/63  Pulse: 106 104 102 90  Temp: 97.8 F (36.6 C)  97.9 F (36.6 C) 98.4 F (36.9 C)  TempSrc: Oral  Oral Oral  Resp: 16   17  Height:      Weight:    167 lb 8.8 oz (76 kg)  SpO2: 100%  100% 100%   Weight change:   Intake/Output Summary (Last 24 hours) at 03/22/15 1006 Last data filed at 03/21/15 2100  Gross per 24 hour  Intake    530 ml  Output   1733 ml  Net  -1203 ml   General Apperance: Resting in bed, no distress HEENT: Normocephalic, atraumatic, PERRL, EOMI, anicteric sclera Lungs: Breath sounds are decreased at the right base, otherwise clear Heart: Regular rate and rhythm Abdomen: Soft, nontender, nondistended, no rebound/guarding Extremities: Warm and well perfused, trace pedal edema of bilateral lower extremities Skin: No rashes or lesions Neurologic: Alert and oriented x 3. No gross deficits. Psych: Flat affect but appropriate.  Lab Results: Basic Metabolic Panel:  Recent Labs Lab 03/19/15 0748 03/21/15 0800 03/21/15 0812  NA 129*  --  132*  K 4.9  --  3.5  CL 95*  --  96*  CO2 24  --  29  GLUCOSE 100*  --  81  BUN 26*  --  22*  CREATININE 3.81*  --  3.01*  CALCIUM 9.1  --  9.0  PHOS 2.8 2.9 2.8   Liver Function Tests:  Recent Labs Lab 03/17/15 1413 03/19/15 0748 03/21/15 0812  PROT 6.7  --   --   ALBUMIN  --  1.9* 2.0*   CBC:  Recent Labs Lab 03/21/15 0645 03/21/15 0812 03/22/15 0611  WBC 6.2 7.4 7.0  NEUTROABS 5.0  --  5.8  HGB 10.8* 10.8* 10.6*  HCT 34.0* 33.7* 33.5*  MCV 95.5 94.1 96.5  PLT 84* 109* 110*   Coagulation:  Recent Labs Lab 03/19/15 0600 03/20/15 0628 03/21/15 0645  03/22/15 0611  LABPROT 22.3* 21.2* 21.6* 21.9*  INR 1.97* 1.84* 1.88* 1.92*    Micro Results: Recent Results (from the past 240 hour(s))  Culture, blood (routine x 2)     Status: None   Collection Time: 03/16/15 10:45 AM  Result Value Ref Range Status   Specimen Description BLOOD RIGHT HAND  Final   Special Requests IN PEDIATRIC BOTTLE 0.5CC  Final   Culture  Setup Time   Final    GRAM POSITIVE COCCI IN CLUSTERS AEROBIC BOTTLE ONLY CRITICAL RESULT CALLED TO, READ BACK BY AND VERIFIED WITH: J ZHU 03/17/15 @ 0730 M VESTAL    Culture STAPHYLOCOCCUS SPECIES (COAGULASE NEGATIVE)  Final   Report Status 03/19/2015 FINAL  Final   Organism ID, Bacteria STAPHYLOCOCCUS SPECIES (COAGULASE NEGATIVE)  Final      Susceptibility   Staphylococcus species (coagulase negative) - MIC*    CIPROFLOXACIN >=8 RESISTANT Resistant     ERYTHROMYCIN >=8 RESISTANT Resistant     GENTAMICIN >=16 RESISTANT Resistant     OXACILLIN >=4 RESISTANT Resistant     TETRACYCLINE >=16 RESISTANT Resistant     VANCOMYCIN 1 SENSITIVE Sensitive     TRIMETH/SULFA 160 RESISTANT Resistant  CLINDAMYCIN >=8 RESISTANT Resistant     RIFAMPIN <=0.5 SENSITIVE Sensitive     Inducible Clindamycin NEGATIVE Sensitive     * STAPHYLOCOCCUS SPECIES (COAGULASE NEGATIVE)  Culture, blood (routine x 2)     Status: None   Collection Time: 03/16/15 11:25 AM  Result Value Ref Range Status   Specimen Description BLOOD RIGHT FOREARM  Final   Special Requests BOTTLES DRAWN AEROBIC AND ANAEROBIC 5CC  Final   Culture  Setup Time   Final    GRAM POSITIVE COCCI IN CLUSTERS ANAEROBIC BOTTLE ONLY CRITICAL RESULT CALLED TO, READ BACK BY AND VERIFIED WITH: S LEE 03/17/15 @ 57 M VESTAL    Culture   Final    STAPHYLOCOCCUS SPECIES (COAGULASE NEGATIVE) SUSCEPTIBILITIES PERFORMED ON PREVIOUS CULTURE WITHIN THE LAST 5 DAYS.    Report Status 03/19/2015 FINAL  Final  Gram stain     Status: None   Collection Time: 03/17/15 10:27 AM  Result Value  Ref Range Status   Specimen Description FLUID PLEURAL RIGHT  Final   Special Requests NONE  Final   Gram Stain   Final    RARE WBC PRESENT, PREDOMINANTLY MONONUCLEAR NO ORGANISMS SEEN    Report Status 03/17/2015 FINAL  Final  AFB culture with smear     Status: None (Preliminary result)   Collection Time: 03/17/15 10:27 AM  Result Value Ref Range Status   Specimen Description FLUID PLEURAL RIGHT  Final   Special Requests NONE  Final   Acid Fast Smear   Final    NO ACID FAST BACILLI SEEN Performed at [facility]    Culture   Final    CULTURE WILL BE EXAMINED FOR 6 WEEKS BEFORE ISSUING A FINAL REPORT Performed at [facility]    Report Status PENDING  Incomplete  Culture, body fluid-bottle     Status: None (Preliminary result)   Collection Time: 03/17/15 10:27 AM  Result Value Ref Range Status   Specimen Description FLUID PLEURAL RIGHT  Final   Special Requests NONE  Final   Culture NO GROWTH 4 DAYS  Final   Report Status PENDING  Incomplete  Culture, Urine     Status: None (Preliminary result)   Collection Time: 03/17/15 11:21 AM  Result Value Ref Range Status   Specimen Description URINE, CATHETERIZED  Final   Special Requests NONE  Final   Culture   Final    >=100,000 COLONIES/mL YEAST 50,000 COLONIES/mL ENTEROCOCCUS SPECIES 2,000 COLONIES/mL GRAM NEGATIVE RODS IDENTIFICATION TO FOLLOW SUSCEPTIBILITIES TO FOLLOW    Report Status PENDING  Incomplete  Blood culture (routine x 2)     Status: None (Preliminary result)   Collection Time: 03/18/15 12:39 PM  Result Value Ref Range Status   Specimen Description BLOOD RIGHT HAND  Final   Special Requests IN PEDIATRIC BOTTLE 1MLS  Final   Culture NO GROWTH 3 DAYS  Final   Report Status PENDING  Incomplete  Blood culture (routine x 2)     Status: None (Preliminary result)   Collection Time: 03/18/15  1:02 PM  Result Value Ref Range Status   Specimen Description BLOOD RIGHT HAND  Final   Special Requests IN  PEDIATRIC BOTTLE 3CCS  Final   Culture NO GROWTH 3 DAYS  Final   Report Status PENDING  Incomplete  Culture, Urine     Status: None (Preliminary result)   Collection Time: 03/18/15  3:23 PM  Result Value Ref Range Status   Specimen Description URINE, RANDOM  Final  Special Requests vancomycin, ceftazidime Immunocompromised  Final   Culture   Final    >=100,000 COLONIES/mL YEAST 20,000 COLONIES/mL ENTEROCOCCUS SPECIES SUSCEPTIBILITIES TO FOLLOW    Report Status PENDING  Incomplete   Studies/Results: No results found.  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  Hospital acquired pneumonia with likely parapneumonic effusions: Completed 7 days of ceftazidime on 12/16. Patient with bilateral pleural effusions. Previously discussed with pulmonology and CT surgery - poor candidate for surgical intervention or chest tubes at this time. -BCx from 12/17 growing coag-negative Staph in 2/2. Possible sources are HAP/parapneumonic effusion vs urinary tract.  Repeat BCx 12/19 no growth. -On vancomycin and ceftazidime. Vanc adequately covers coag-neg staph on BCx but does have recent history of VRE UTI  -Pulmonology reconsulted - appreciate recs. Talked with IR 12/20 who feel drainage would not be feasible at this time. Will continue with prolonged abx course currently. May require chest tube drainage in the future if clinical course changes.  Will treat with 14 days of antibiotics with HD and first day of treatment being 12/17.  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 UCx from 12/18 via I&O cath showed >100K yeast  (speciation pending), Enterococcus, few GNR's. -Continue vanc and ceftazidime as above and hold antifungals at this time. 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 Platelets increased from <5 to 140,000. Currently stable and improving. No clinically significant bleeding currently. -Heme following, appreciate recommendations -NPlate weekly -status post 2 courses of Dexamethasone 20mg  BID x 4d, finished 12/20 -Continue Plaquenil 400mg  daily -Will need to discuss with rheumatology possible escalation of therapy for lupus.  She will need follow up with Dr. Norva Riffle at discharge.  Psychosis, Depression: She had acute confusion and hallucinations possibly 2/2 UTI. Her symptoms improved but worsened 12/9. 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 Low suspicion for Lupus cerebritis. Psychosis resolved. -Continue olanzapine 5mg  BID -Continue duloxetine 40mg  QD  ESRD on HD, s/p Left thigh AVG:AVG placed 01/04/2015. Back on her outpatient TTS schedule. She has some left leg edema likely 2/2 AVG. Venous duplex with post surgical changes and no DVT. -Midodrine 5mg  BID -Nephrology following, appreciate recs -HD per nephrology  Acute on chronic anemia: Hgb stable -Continue to monitor. Transfuse prn for hgb <7.  History of recent R. MCA Embolic CVA:  -Daily PT/INR -Coumadin held while plt <50.  Deconditioning -Continue PT -Currently not a CIR candidate; will need SNF placement with hopeful dispo today  History of SVT/Tachycardia: Continue Toprol-XL 12.5mg  daily  FEN: Renal VTE ppx: SCDs  FULL CODE  Dispo: Disposition is deferred at this time, awaiting improvement of current medical problems.   The patient does have a current PCP Lin Landsman, MD) and does not  need an [facility] hospital follow-up appointment after discharge.  The patient does not have transportation limitations that hinder transportation to clinic appointments   LOS: 31 days   Jule Ser, DO 03/22/2015, 10:06 AM

## 2015-03-22 NOTE — [place] Notes (Signed)
Patient will DC to: [facility] Anticipated DC date: 03/22/15 Family notified: Mother, Carolan Clines by: [facility]AR  CSW signing off.  Cedric Fishman, [place] Social Worker [phone]

## 2015-03-23 LAB — URINE CULTURE: Culture: >=100000

## 2015-03-23 LAB — CULTURE, BLOOD (ROUTINE X 2)
CULTURE: NO GROWTH
CULTURE: NO GROWTH

## 2015-03-26 ENCOUNTER — Encounter: Payer: Self-pay | Admitting: Internal Medicine

## 2015-03-29 ENCOUNTER — Inpatient Hospital Stay (HOSPITAL_COMMUNITY)
Admission: EM | Admit: 2015-03-29 | Discharge: 2015-04-03 | DRG: 545 | Disposition: A | Discharge status: Skilled Nursing Facility | Payer: Medicaid Other | Attending: Internal Medicine | Admitting: Internal Medicine

## 2015-03-29 ENCOUNTER — Encounter (HOSPITAL_COMMUNITY): Payer: Self-pay | Admitting: Emergency Medicine

## 2015-03-29 ENCOUNTER — Emergency Department (HOSPITAL_COMMUNITY): Payer: Medicaid Other

## 2015-03-29 DIAGNOSIS — R011 Cardiac murmur, unspecified: Secondary | ICD-10-CM | POA: Diagnosis present

## 2015-03-29 DIAGNOSIS — R791 Abnormal coagulation profile: Secondary | ICD-10-CM | POA: Diagnosis present

## 2015-03-29 DIAGNOSIS — I959 Hypotension, unspecified: Secondary | ICD-10-CM | POA: Diagnosis present

## 2015-03-29 DIAGNOSIS — Z87440 Personal history of urinary (tract) infections: Secondary | ICD-10-CM

## 2015-03-29 DIAGNOSIS — J9811 Atelectasis: Secondary | ICD-10-CM | POA: Diagnosis present

## 2015-03-29 DIAGNOSIS — D696 Thrombocytopenia, unspecified: Secondary | ICD-10-CM | POA: Diagnosis present

## 2015-03-29 DIAGNOSIS — M3214 Glomerular disease in systemic lupus erythematosus: Principal | ICD-10-CM

## 2015-03-29 DIAGNOSIS — L89153 Pressure ulcer of sacral region, stage 3: Secondary | ICD-10-CM | POA: Diagnosis present

## 2015-03-29 DIAGNOSIS — M797 Fibromyalgia: Secondary | ICD-10-CM | POA: Diagnosis present

## 2015-03-29 DIAGNOSIS — Z8041 Family history of malignant neoplasm of ovary: Secondary | ICD-10-CM

## 2015-03-29 DIAGNOSIS — E876 Hypokalemia: Secondary | ICD-10-CM | POA: Diagnosis present

## 2015-03-29 DIAGNOSIS — G9340 Encephalopathy, unspecified: Secondary | ICD-10-CM | POA: Diagnosis present

## 2015-03-29 DIAGNOSIS — D649 Anemia, unspecified: Secondary | ICD-10-CM | POA: Diagnosis present

## 2015-03-29 DIAGNOSIS — N186 End stage renal disease: Secondary | ICD-10-CM | POA: Diagnosis present

## 2015-03-29 DIAGNOSIS — Z8673 Personal history of transient ischemic attack (TIA), and cerebral infarction without residual deficits: Secondary | ICD-10-CM

## 2015-03-29 DIAGNOSIS — L259 Unspecified contact dermatitis, unspecified cause: Secondary | ICD-10-CM | POA: Diagnosis present

## 2015-03-29 DIAGNOSIS — T604X1A Toxic effect of rodenticides, accidental (unintentional), initial encounter: Secondary | ICD-10-CM

## 2015-03-29 DIAGNOSIS — T45511A Poisoning by anticoagulants, accidental (unintentional), initial encounter: Secondary | ICD-10-CM | POA: Diagnosis present

## 2015-03-29 DIAGNOSIS — D62 Acute posthemorrhagic anemia: Secondary | ICD-10-CM

## 2015-03-29 DIAGNOSIS — Z992 Dependence on renal dialysis: Secondary | ICD-10-CM | POA: Insufficient documentation

## 2015-03-29 DIAGNOSIS — Z7901 Long term (current) use of anticoagulants: Secondary | ICD-10-CM | POA: Diagnosis not present

## 2015-03-29 DIAGNOSIS — L89322 Pressure ulcer of left buttock, stage 2: Secondary | ICD-10-CM | POA: Diagnosis present

## 2015-03-29 DIAGNOSIS — G4730 Sleep apnea, unspecified: Secondary | ICD-10-CM | POA: Diagnosis present

## 2015-03-29 DIAGNOSIS — R627 Adult failure to thrive: Secondary | ICD-10-CM | POA: Diagnosis present

## 2015-03-29 DIAGNOSIS — M329 Systemic lupus erythematosus, unspecified: Secondary | ICD-10-CM | POA: Diagnosis present

## 2015-03-29 DIAGNOSIS — Y92129 Unspecified place in nursing home as the place of occurrence of the external cause: Secondary | ICD-10-CM

## 2015-03-29 DIAGNOSIS — R159 Full incontinence of feces: Secondary | ICD-10-CM | POA: Diagnosis present

## 2015-03-29 DIAGNOSIS — T45515A Adverse effect of anticoagulants, initial encounter: Secondary | ICD-10-CM | POA: Diagnosis present

## 2015-03-29 DIAGNOSIS — F323 Major depressive disorder, single episode, severe with psychotic features: Secondary | ICD-10-CM | POA: Diagnosis present

## 2015-03-29 DIAGNOSIS — K921 Melena: Secondary | ICD-10-CM | POA: Diagnosis present

## 2015-03-29 DIAGNOSIS — L89312 Pressure ulcer of right buttock, stage 2: Secondary | ICD-10-CM | POA: Diagnosis present

## 2015-03-29 DIAGNOSIS — M1990 Unspecified osteoarthritis, unspecified site: Secondary | ICD-10-CM | POA: Diagnosis present

## 2015-03-29 DIAGNOSIS — N390 Urinary tract infection, site not specified: Secondary | ICD-10-CM | POA: Diagnosis present

## 2015-03-29 DIAGNOSIS — I120 Hypertensive chronic kidney disease with stage 5 chronic kidney disease or end stage renal disease: Secondary | ICD-10-CM | POA: Diagnosis present

## 2015-03-29 DIAGNOSIS — K219 Gastro-esophageal reflux disease without esophagitis: Secondary | ICD-10-CM | POA: Diagnosis present

## 2015-03-29 DIAGNOSIS — E7800 Pure hypercholesterolemia, unspecified: Secondary | ICD-10-CM | POA: Diagnosis present

## 2015-03-29 DIAGNOSIS — J9601 Acute respiratory failure with hypoxia: Secondary | ICD-10-CM | POA: Diagnosis not present

## 2015-03-29 DIAGNOSIS — R233 Spontaneous ecchymoses: Secondary | ICD-10-CM | POA: Diagnosis present

## 2015-03-29 DIAGNOSIS — Z803 Family history of malignant neoplasm of breast: Secondary | ICD-10-CM

## 2015-03-29 LAB — CBC WITH DIFFERENTIAL/PLATELET
BASOS ABS: 0.2 10*3/uL — AB (ref 0.0–0.1)
Basophils Relative: 2 %
EOS ABS: 0.2 10*3/uL (ref 0.0–0.7)
Eosinophils Relative: 2 %
HEMATOCRIT: 23.2 % — AB (ref 36.0–46.0)
HEMOGLOBIN: 7.7 g/dL — AB (ref 12.0–15.0)
LYMPHS ABS: 0.3 10*3/uL — AB (ref 0.7–4.0)
LYMPHS PCT: 3 %
MCH: 31.8 pg (ref 26.0–34.0)
MCHC: 33.2 g/dL (ref 30.0–36.0)
MCV: 95.9 fL (ref 78.0–100.0)
MONOS PCT: 6 %
Monocytes Absolute: 0.5 10*3/uL (ref 0.1–1.0)
Neutro Abs: 7.4 10*3/uL (ref 1.7–7.7)
Neutrophils Relative %: 87 %
Platelets: 112 10*3/uL — ABNORMAL LOW (ref 150–400)
RBC: 2.42 MIL/uL — AB (ref 3.87–5.11)
RDW: 21.2 % — AB (ref 11.5–15.5)
Smear Review: DECREASED
WBC: 8.6 10*3/uL (ref 4.0–10.5)

## 2015-03-29 LAB — COMPREHENSIVE METABOLIC PANEL
ALBUMIN: 1.6 g/dL — AB (ref 3.5–5.0)
ALK PHOS: 97 U/L (ref 38–126)
ALT: 21 U/L (ref 14–54)
AST: 49 U/L — ABNORMAL HIGH (ref 15–41)
Anion gap: 11 (ref 5–15)
CALCIUM: 8.7 mg/dL — AB (ref 8.9–10.3)
CO2: 28 mmol/L (ref 22–32)
CREATININE: 2.53 mg/dL — AB (ref 0.44–1.00)
Chloride: 95 mmol/L — ABNORMAL LOW (ref 101–111)
GFR calc Af Amer: 26 mL/min — ABNORMAL LOW (ref >60)
GFR calc non Af Amer: 23 mL/min — ABNORMAL LOW (ref >60)
GLUCOSE: 72 mg/dL (ref 65–99)
Potassium: 3.3 mmol/L — ABNORMAL LOW (ref 3.5–5.1)
SODIUM: 134 mmol/L — AB (ref 135–145)
Total Bilirubin: 0.8 mg/dL (ref 0.3–1.2)
Total Protein: 5.6 g/dL — ABNORMAL LOW (ref 6.5–8.1)

## 2015-03-29 LAB — I-STAT CG4 LACTIC ACID, ED
LACTIC ACID, VENOUS: 2.37 mmol/L — AB (ref 0.5–2.0)
Lactic Acid, Venous: 1.99 mmol/L (ref 0.5–2.0)

## 2015-03-29 LAB — PROTIME-INR
INR: 5.67 (ref 0.00–1.49)
Prothrombin Time: 49.3 seconds — ABNORMAL HIGH (ref 11.6–15.2)

## 2015-03-29 LAB — APTT: APTT: 61 s — AB (ref 24–37)

## 2015-03-29 LAB — POC OCCULT BLOOD, ED: Fecal Occult Bld: POSITIVE — AB

## 2015-03-29 MED ORDER — PIPERACILLIN-TAZOBACTAM IN DEX 2-0.25 GM/50ML IV SOLN
2.2500 g | Freq: Three times a day (TID) | INTRAVENOUS | Status: DC
  Filled 2015-03-29 (×2): qty 50

## 2015-03-29 MED ORDER — PIPERACILLIN-TAZOBACTAM 3.375 G IVPB 30 MIN: 3.3750 g | Freq: Once | INTRAVENOUS | Status: DC

## 2015-03-29 MED ORDER — SODIUM CHLORIDE 0.9 % IV BOLUS (SEPSIS)
1000.0000 mL | Freq: Once | INTRAVENOUS | Status: AC
  Administered 2015-03-29: 1000 mL via INTRAVENOUS

## 2015-03-29 MED ORDER — VANCOMYCIN HCL IN DEXTROSE 1-5 GM/200ML-% IV SOLN: 1000.0000 mg | Freq: Once | INTRAVENOUS | Status: DC

## 2015-03-29 MED ORDER — SODIUM CHLORIDE 0.9 % IV BOLUS (SEPSIS): 500.0000 mL | INTRAVENOUS | Status: AC

## 2015-03-29 MED ORDER — SODIUM CHLORIDE 0.9 % IV BOLUS (SEPSIS): 1000.0000 mL | INTRAVENOUS | Status: AC

## 2015-03-29 MED ORDER — VANCOMYCIN HCL IN DEXTROSE 750-5 MG/150ML-% IV SOLN
750.0000 mg | INTRAVENOUS | Status: DC
  Filled 2015-03-29: qty 150

## 2015-03-29 MED ORDER — SODIUM CHLORIDE 0.9 % IV SOLN: Freq: Once | INTRAVENOUS | Status: DC

## 2015-03-29 NOTE — ED Notes (Signed)
Per EMS:  Pt presents to ED from [place] with a hx of Lupus, incapacity from previous strokes, end stage renal disease and encephalopathy.  Pt states she has been having issues with "skin flaking off and painful" due to her Lupus.  Pt sts the worst area is the skin of her bottom.  Vitals stable, pt axo

## 2015-03-29 NOTE — H&P (Signed)
Date: 03/30/2015               Patient Name:  [name] MRN: [id number]  DOB: 1975/09/11 Age / Sex: 39 y.o., female   PCP: Lin Landsman, MD         Medical Service: Internal Medicine Teaching Service         Attending Physician: Dr. Axel Filler, MD    First Contact: Dr. Burgess Estelle Pager: [phone]  Second Contact: Dr. Jacques Earthly Pager: [phone]       After Hours (After 5p/  First Contact Pager: [phone]  weekends / holidays): Second Contact Pager: [phone]   Chief Complaint: Diffuse rash and increased fatigue  History of Present Illness: 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 She continues to have significant pain in her buttocks region, especially when sitting up for HD sessions although she is now able to sit up for all 4 hours. Her mom has tried putting moisturizing lotions on her legs and stomach but it only helps intermittently. Since yesterday, she has become increasingly fatigued as well. She says that occasionally, she gets short of breath but this is improving since her previous admission. She denies fevers or chills, headache, vision changes, chest pain, palpitations, cough, nausea, vomiting, abdominal pain, any bowel symptoms, including melena or hematochezia, focal weakness or numbness, or other symptoms at this time.  Her mom called her rheumatologist, Dr. Charlestine Night, who had concern for a lupus flare with cutaneous manifestations. Her INR was checked at the SNF yesterday and was over 4. In the ED, she was also found  to have blood-tinged stool although she denies noticing these previously, INR 5.7, Hgb down to 7.7 from 10.7 one week ago, so she was given FFP. CT head was also done given her h/o stroke, INR, and increased somnolence, which was negative for any changes from previous imaging.  Meds: Current Facility-Administered Medications  Medication Dose Route Frequency Provider Last Rate Last Dose  . 0.9 %  sodium chloride infusion   Intravenous Once Orlie Dakin, MD      . antiseptic oral rinse (CPC / CETYLPYRIDINIUM CHLORIDE 0.05%) solution 7 mL  7 mL Mouth Rinse BID Axel Filler, MD   7 mL at 03/30/15 0100  . DULoxetine (CYMBALTA) DR capsule 40 mg  40 mg Oral Daily Rushil Patel V, MD      . hydroxychloroquine (PLAQUENIL) tablet 400 mg  400 mg Oral Daily Rushil Patel V, MD      . OLANZapine zydis (ZYPREXA) disintegrating tablet 10 mg  10 mg Oral BID PC Rushil Sherrye Payor, MD        Allergies: Allergies as of 03/29/2015 - Review Complete 03/29/2015  Allergen Reaction Noted  . Food Swelling 08/29/2014   Past Medical History  Diagnosis Date  . Hypertension   . Cardiac arrhythmia   . SVT (supraventricular tachycardia) ([place])     "today, last week, 2 wk ago; maybe 1 month ago, etc; started w/in last 3-4 yrs"(07/20/2012)  . Fainting     "~ 1 month  ago; probably related to SVT" (07/20/2012)  . Shortness of breath     "related to SVT episodes" (07/20/2012)  . Chest pain at rest     "related to SVT" (07/20/2012)  . Lupus (systemic lupus erythematosus) ([place])   . GERD (gastroesophageal reflux disease)   . Fibromyalgia   . Irritable bowel syndrome (IBS)   . Hypercholesterolemia   . Heart murmur     "small" (12/25/2014)  . TIA (transient ischemic attack) 12/21/2014  . Sleep apnea     "don't wear mask anymore" (12/25/2014)  . Anemia   . History of blood transfusion "several"    "related to low counts"  . Daily headache   . Arthritis     "hips" (12/25/2014)  . Anxiety     "sometimes; don't take  anything for it" (12/25/2014)  . ESRD (end stage renal disease) on dialysis ([place]) since 12/07/2014    Diagnosed Aug 2014 w SLE nephritis DPGN by biopsy, rx cellcept/ steroids.  Repeat biopsy early 2016 membranous w/o activity so meds weaned off. Big flare Aug '16 creat 6, 3rd biopsy DPGN, meds resumed. Ended up starting HD Sept 2016  . Stroke ([place])   . History of vascular access device     2016: Sept 9  R IJ cath per IR.  Sept 20 not candidate for fistula due to disease veins, had LUA hybrid graft placed. Sept 21 - steal syndrome, LUA AVG ligated. Oct 7 - new left femoral Gore-tex loop graft per [facility]. Oct 29 - removal R IJ tunneled HD cath   Past Surgical History  Procedure Laterality Date  . Cesarean section  2001; 2010  . Cervical biopsy  2013  . Supraventricular tachycardia ablation  07/21/12     Dr. Cristopher Peru   . Peripheral vascular catheterization N/A 12/14/2014    Procedure: Upper Extremity Venography;  Surgeon: Elam Dutch, MD;  Location: [facility];  Service: Cardiovascular;  Laterality: N/A;  . Insertion hybrid anteriovenous gortex graft Left 12/18/2014    Procedure: INSERTION GORE HYBRID ARTERIOVENOUS GRAFT LEFT AXILLO-BRACHIAL.;  Surgeon: Serafina Mitchell, MD;  Location: [facility] OR;  Service: Vascular;  Laterality: Left;  . Ligation of arteriovenous  fistula Left 12/19/2014    Procedure: LIGATION OF FISTULA;  Surgeon: Elam Dutch, MD;  Location: [facility];  Service: Vascular;  Laterality: Left;  . Appendectomy  2011  . Tubal ligation  2010  . Tee without cardioversion N/A 12/25/2014    Procedure: TRANSESOPHAGEAL ECHOCARDIOGRAM (TEE);  Surgeon: Sueanne Margarita, MD;  Location: [facility];  Service: Cardiovascular;  Laterality: N/A;  . Av fistula placement Left 01/04/2015    Procedure: INSERTION OF ARTERIOVENOUS (AV) GORE-TEX GRAFT THIGH;  Surgeon: Rosetta Posner, MD;  Location: [place];  Service: Vascular;  Laterality: Left;  . Tee without cardioversion N/A 01/30/2015    Procedure:  TRANSESOPHAGEAL ECHOCARDIOGRAM (TEE);  Surgeon: Lelon Perla, MD;  Location: [facility];  Service: Cardiovascular;  Laterality: N/A;   Family History  Problem Relation Age of Onset  . Cancer Mother     breast and ovarian  . BRCA 1/2 Sister    Social History   Social History  . Marital Status: Single    Spouse Name: N/A  . Number of Children: N/A  . Years of Education: N/A   Occupational History  . Not on file.   Social History Main Topics  . Smoking status: Never Smoker   . Smokeless tobacco: Never Used  . Alcohol Use: No  Comment: 07/20/2012 "might have a beer once/yr"  . Drug Use: No  . Sexual Activity: Not Currently    Birth Control/ Protection: None   Other Topics Concern  . Not on file   Social History Narrative   Review of Systems: Pertinent items noted in HPI and remainder of comprehensive ROS otherwise negative.  Physical Exam: Blood pressure 93/48, pulse 101, temperature 97.1 F (36.2 C), temperature source Oral, resp. rate 15, height _0  (1.6 m), weight 172 lb 2.9 oz (78.1 kg), last menstrual period 03/18/2015, SpO2 100 %.   Gen: Intermittently somnolent, oriented to person, place, and time, no acute distress HEENT: Oropharynx clear without erythema or exudate.  Neck: No cervical LAD, no thyromegaly or nodules, no JVD noted. CV: Tachycardic rate, regular rhythm. There is a 3/6 systolic murmur heard best at LSB that is non-radiating. Pulmonary: Decreased effort, CTA bilaterally, no wheezing, rales, or rhonchi Abdominal: Soft, non-tender, non-distended, without rebound, guarding, or masses Extremities: Warm and well perfused, 2+ edema of the left thigh around graft site, 1+ distal LLE edema. Bandages around L femoral graft  Neuro: CN II-XII intact grossly, no focal neurologic deficits of strength or sensation noted. Skin: No atypical appearing moles.  Diffuse painful scaling rash on bilateral legs and stomach with breaks in skin/scabbing, Nikoski's  negative. There are extensive ecchymoses on her back and upper arms as well as a confluent erythematous patch on her left arm as well. Please see photos for representative lesions.   Right leg  Right foot  Right thigh  Lab results: Basic Metabolic Panel:  Recent Labs  03/29/15 1920  NA 134*  K 3.3*  CL 95*  CO2 28  GLUCOSE 72  BUN <5*  CREATININE 2.53*  CALCIUM 8.7*   Liver Function Tests:  Recent Labs  03/29/15 1920  AST 49*  ALT 21  ALKPHOS 97  BILITOT 0.8  PROT 5.6*  ALBUMIN 1.6*   CBC:  Recent Labs  03/29/15 1920  WBC 8.6  NEUTROABS 7.4  HGB 7.7*  HCT 23.2*  MCV 95.9  PLT 112*   Coagulation:  Recent Labs  03/29/15 1920  LABPROT 49.3*  INR 5.67*   Urine Drug Screen: Drugs of Abuse     Component Value Date/Time   LABOPIA NONE DETECTED 07/21/2012 0215   COCAINSCRNUR NONE DETECTED 07/21/2012 0215   LABBENZ NONE DETECTED 07/21/2012 0215   AMPHETMU NONE DETECTED 07/21/2012 0215   THCU NONE DETECTED 07/21/2012 0215   LABBARB NONE DETECTED 07/21/2012 0215    Imaging results:  Ct Head Wo Contrast  03/29/2015  CLINICAL DATA:  Initial encounter for episodes of somnolence EXAM: CT HEAD WITHOUT CONTRAST TECHNIQUE: Contiguous axial images were obtained from the base of the skull through the vertex without intravenous contrast. COMPARISON:  02/22/2015. FINDINGS: Area of hypo attenuation in the posterior right frontal lobe is stable since the prior study and was characterized on previous MRI as infarct. No evidence for new acute infarct on today's study. No evidence for acute hemorrhage, hydrocephalus, or mass lesion. No midline shift. No abnormal extra-axial fluid collection. The visualized paranasal sinuses and mastoid air cells are clear. IMPRESSION: Stable appearance of posterior right frontal encephalomalacia, in an area of known infarct. No new or progressive findings on today's exam. Electronically Signed   By: Misty Stanley M.D.   On: 03/29/2015 21:33     Dg Chest Portable 1 View  03/29/2015  CLINICAL DATA:  Subsequent encounter for lupus with concern for sepsis of unknown source. EXAM:  PORTABLE CHEST 1 VIEW COMPARISON:  03/17/2015. FINDINGS: 1838 hours. The cardio pericardial silhouette is enlarged. Bibasilar atelectasis/ pneumonia persists with some improvement in aeration at the left base. No evidence for overt airspace pulmonary edema. Imaged bony structures of the thorax are intact. Telemetry leads overlie the chest. IMPRESSION: Cardiomegaly with left greater than right basilar atelectasis or pneumonia. Aeration at the left base is improved in the interval. Electronically Signed   By: Misty Stanley M.D.   On: 03/29/2015 18:47   Other results: EKG: unchanged from previous tracings, sinus tachycardia, prolonged QT interval.  Assessment & Plan by Problem: Active Problems:   Coumadin toxicity 1. Hematochezia in the setting of supratherapeutic INR - progressive fatigue, INR 5.7, Hgb dropped 3 points in 1 week, blood tinged stool found in ED although not noticed by patient. Plts 112, stable from previous admission. CT head negative for any acute changes.  Likely lower GI source in the setting of supratherapeutic INR. -Receiving FFP infusion now, started in ED -Type and screen; Transfuse PRN -Trend CBC, CMP, PT/INR -Hold coumadin -Consider GI workup if Hgb continues to trend down despite correction of INR  2. Diffuse scaling/ecchymotic rash - started only 3 days ago, not previously present. While some ecchymotic areas can be most likely explained by supratherapeutic INR and h/o SLE thrombocytopenia, the ichthyotic, scaling lesions throughout her stomach, legs, and feet are less clearly related to the above issue. These may be cutaneous manifestations of her SLE, although these are very irregular for SLE. She has not received any new medications recently so drug-related reaction is less likely. -May consider skin biopsy if does not improve with  correction of INR -Consider consulting [facility] for further recommendations  3. H/o recurrent polymicrobial UTIs, recent HAP w bilat pleural effusions, and coag-negative staph bacteremia - no overt s/s of infection currently, afebrile. However, patient is increasingly fatigued, BPs on the softer side despite getting dialysis yesterday. Respiratory status continues to improve from previous admission. CXR in ED was stable, if not improved although a CT would better assess her effusions. I suspect that this is most likely due to volume loss, but given that she is immunocompromised and has a history of many various serious infections, possible infection is always a concern. She completed her 14-day course of vancomycin and ceftazidime during HD yesterday, 12/30. -Hold abx for now - received last doses  of 14 day vanc/ceftaz course yesterday -F/u BCx -F/u UA and UCx. If has not been done by tomorrow morning, may need to reiterate that she requires I&O for specimen as she is anuric -Consider repeat CT chest if complains of worsening cough, shortness of breath to re-evaluate pleural effusions  4. Lupus nephritis, ESRD on TTSa HD through L thigh AVG -Consult nephrology in AM -Midodrine during HD -Continue plaquenil  5. Major depression with psychotic features - no recurrent hallucinations or delusions since previous admission -Continue olanzapine, cymbalta  5. Deconditioning due to chronic illness and long-term hospitalization - ongoing challenges given her deconditioning. However, the patient has been making progress, able to fully tolerate HD sessions in a chair now. Per her mother, however, the SNF she came from has only seen her for 20 minutes at a time on 2 separate occasions over the course of 1 week ("because of the holidays"), which seems inadequate given her apparent needs -Consult PT/OT -Consider re-consulting CIR for possible placement  PPX - DVT: SCDs; coumadin held  Diet: renal  Dispo:  Disposition is deferred at this time, awaiting improvement  of current medical problems.   The patient does have a current PCP Lin Landsman, MD) and does need an [facility] hospital follow-up appointment after discharge.  The patient does have transportation limitations that hinder transportation to clinic appointments.  Signed: Norval Gable, MD 03/30/2015, 2:11 AM

## 2015-03-29 NOTE — ED Provider Notes (Signed)
Patient with diffuse rash since yesterday. No known fever on exam chronically ill-appearing. HEENT exam moon facies. No mucosal lesion. Conjunctiva pale. Lungs clear to auscultation abdomen obese, nontender. Extremities without edema. Rectum blood-tinged stool. Skin there is superficial decubitus ulcer the presacral area and also at right scapula. Ecchymotic rash diffusely  2005 p.m. patient appears somewhat somnolent. Head CT ordered to check for intracranial bleed. 20 1:45 PM patient is alert Glasgow Coma Score 15. Appears in no distress. Patient's blood pressure steadily increased with intravenous volume consisting of normal saline and fresh frozen plasma. Lactate slightly increased after treatment. However clinically she looks improved. I discussed case with Dr.Deterding him a intensivist who feels that she can go to stepdown unit.Posey Pronto was consulted, will see patient in the emergency department and arrange for admission to stepdown bed Rashes felt to be secondary to coagulopathy which is iatrogenically induced secondary to Coumadin toxicity. Sepsis less likely Results for orders placed or performed during the hospital encounter of 03/29/15  CBC with Differential/Platelet  Result Value Ref Range   WBC 8.6 4.0 - 10.5 K/uL   RBC 2.42 (L) 3.87 - 5.11 MIL/uL   Hemoglobin 7.7 (L) 12.0 - 15.0 g/dL   HCT 23.2 (L) 36.0 - 46.0 %   MCV 95.9 78.0 - 100.0 fL   MCH 31.8 26.0 - 34.0 pg   MCHC 33.2 30.0 - 36.0 g/dL   RDW 21.2 (H) 11.5 - 15.5 %   Platelets 112 (L) 150 - 400 K/uL   Neutrophils Relative % 87 %   Lymphocytes Relative 3 %   Monocytes Relative 6 %   Eosinophils Relative 2 %   Basophils Relative 2 %   Neutro Abs 7.4 1.7 - 7.7 K/uL   Lymphs Abs 0.3 (L) 0.7 - 4.0 K/uL   Monocytes Absolute 0.5 0.1 - 1.0 K/uL   Eosinophils Absolute 0.2 0.0 - 0.7 K/uL   Basophils Absolute 0.2 (H) 0.0 - 0.1 K/uL   Smear Review PLATELETS APPEAR DECREASED   Comprehensive metabolic panel  Result Value Ref Range    Sodium 134 (L) 135 - 145 mmol/L   Potassium 3.3 (L) 3.5 - 5.1 mmol/L   Chloride 95 (L) 101 - 111 mmol/L   CO2 28 22 - 32 mmol/L   Glucose, Bld 72 65 - 99 mg/dL   BUN <5 (L) 6 - 20 mg/dL   Creatinine, Ser 2.53 (H) 0.44 - 1.00 mg/dL   Calcium 8.7 (L) 8.9 - 10.3 mg/dL   Total Protein 5.6 (L) 6.5 - 8.1 g/dL   Albumin 1.6 (L) 3.5 - 5.0 g/dL   AST 49 (H) 15 - 41 U/L   ALT 21 14 - 54 U/L   Alkaline Phosphatase 97 38 - 126 U/L   Total Bilirubin 0.8 0.3 - 1.2 mg/dL   GFR calc non Af Amer 23 (L) >60 mL/min   GFR calc Af Amer 26 (L) >60 mL/min   Anion gap 11 5 - 15  Protime-INR  Result Value Ref Range   Prothrombin Time 49.3 (H) 11.6 - 15.2 seconds   INR 5.67 (HH) 0.00 - 1.49  APTT  Result Value Ref Range   aPTT 61 (H) 24 - 37 seconds  I-Stat CG4 Lactic Acid, ED  (not at  [facility])  Result Value Ref Range   Lactic Acid, Venous 1.99 0.5 - 2.0 mmol/L  POC occult blood, ED Provider will collect  Result Value Ref Range   Fecal Occult Bld POSITIVE (A) NEGATIVE  I-Stat CG4 Lactic Acid,  ED  (not at  [facility])  Result Value Ref Range   Lactic Acid, Venous 2.37 (HH) 0.5 - 2.0 mmol/L   Comment NOTIFIED PHYSICIAN   Prepare fresh frozen plasma  Result Value Ref Range   Unit Number [id number]    Blood Component Type THAWED PLASMA    Unit division 00    Status of Unit ISSUED    Transfusion Status OK TO TRANSFUSE    Unit Number [id number]    Blood Component Type THAWED PLASMA    Unit division 00    Status of Unit ALLOCATED    Transfusion Status OK TO TRANSFUSE    Unit Number [id number]    Blood Component Type THAWED PLASMA    Unit division 00    Status of Unit ALLOCATED    Transfusion Status OK TO TRANSFUSE    Unit Number [id number]    Blood Component Type THAWED PLASMA    Unit division 00    Status of Unit ALLOCATED    Transfusion Status OK TO TRANSFUSE    Dg Chest 1 View  03/17/2015  CLINICAL DATA:  Status post right-sided thoracentesis EXAM: CHEST  1 VIEW COMPARISON:   03/16/2015 FINDINGS: Cardiac shadow is enlarged. A right-sided thoracentesis has been performed with reduction in right-sided pleural effusion. No pneumothorax is seen. A stable left axillary stent is noted. No bony abnormality is seen. IMPRESSION: No pneumothorax following right thoracentesis. Electronically Signed   By: Inez Catalina M.D.   On: 03/17/2015 10:54   Dg Chest 1 View  03/11/2015  CLINICAL DATA:  39 year old female status post left-sided thoracentesis yielding bloody fluid. EXAM: CHEST 1 VIEW COMPARISON:  Chest x-ray 03/08/2015. FINDINGS: Moderate bilateral pleural effusions (right greater than left) with associated bibasilar opacities which may reflect areas of atelectasis and/or airspace consolidation no pneumothorax. Perihilar consolidation noted on the prior study appears improved on today's examination. No associated cephalization of the pulmonary vasculature. Heart size is upper limits of normal. Upper mediastinal contours are within normal limits. Atherosclerotic calcifications in the arch of the thoracic aorta. IMPRESSION: 1. No postprocedural pneumothorax. 2. Moderate bilateral pleural effusions (right greater than left) with atelectasis and/or consolidation throughout the lung bases bilaterally. 3. Decreasing perihilar airspace consolidation compared to prior examination. 4. Atherosclerosis. Electronically Signed   By: Vinnie Langton M.D.   On: 03/11/2015 15:27   Ct Head Wo Contrast  03/29/2015  CLINICAL DATA:  Initial encounter for episodes of somnolence EXAM: CT HEAD WITHOUT CONTRAST TECHNIQUE: Contiguous axial images were obtained from the base of the skull through the vertex without intravenous contrast. COMPARISON:  02/22/2015. FINDINGS: Area of hypo attenuation in the posterior right frontal lobe is stable since the prior study and was characterized on previous MRI as infarct. No evidence for new acute infarct on today's study. No evidence for acute hemorrhage, hydrocephalus, or  mass lesion. No midline shift. No abnormal extra-axial fluid collection. The visualized paranasal sinuses and mastoid air cells are clear. IMPRESSION: Stable appearance of posterior right frontal encephalomalacia, in an area of known infarct. No new or progressive findings on today's exam. Electronically Signed   By: Misty Stanley M.D.   On: 03/29/2015 21:33   Ct Chest Wo Contrast  03/18/2015  CLINICAL DATA:  Shortness of Breath EXAM: CT CHEST WITHOUT CONTRAST TECHNIQUE: Multidetector CT imaging of the chest was performed following the standard protocol without IV contrast. COMPARISON:  Chest CT March 10, 2015; chest radiograph March 17, 2015 FINDINGS: Mediastinum/Lymph Nodes: Visualized thyroid appears normal. There are multiple  small mediastinal lymph nodes without frank adenopathy. There is a fairly small pericardial effusion. There are scattered foci of coronary artery calcification. There is no thoracic aortic aneurysm. Visualized great vessels appear unremarkable on this noncontrast enhanced study. Heart is mildly enlarged. Lungs/Pleura: There are persistent moderate pleural effusions bilaterally with bibasilar consolidation. The ground-glass opacity noted in the upper lobes on recent prior study has resolved. There is no new opacity in the lung parenchyma. On axial slice 25 series 3, there is a 4 mm nodular opacity in the lateral segment right middle lobe. Upper abdomen: No focal lesions are identified in the visualized upper abdomen region. Musculoskeletal: No blastic or lytic bone lesions. IMPRESSION: Moderate pleural effusions bilaterally with stable bibasilar consolidation. Some of this consolidation is new atelectatic in etiology, although there is concern for bibasilar pneumonia as well. The previously noted upper lobe ground-glass opacity has resolved bilaterally. 4 mm nodular opacity lateral segment right middle lobe. Followup of this nodular opacity should be based on Fleischner Society  guidelines. If the patient is at high risk for bronchogenic carcinoma, follow-up chest CT at 1 year is recommended. If the patient is at low risk, no follow-up is needed. This recommendation follows the consensus statement: Guidelines for Management of Small Pulmonary Nodules Detected on CT Scans: A Statement from the [place] as published in Radiology 2005; 237:395-400. Cardiomegaly with small pericardial effusion. Scattered foci of coronary artery calcification. Small mediastinal lymph nodes without adenopathy by size criteria. Electronically Signed   By: Lowella Grip III M.D.   On: 03/18/2015 18:48   Ct Chest Wo Contrast  03/10/2015  CLINICAL DATA:  Cough, pulmonary infiltrate, end-stage renal disease EXAM: CT CHEST WITHOUT CONTRAST TECHNIQUE: Multidetector CT imaging of the chest was performed following the standard protocol without IV contrast. COMPARISON:  Images of the lung bases CT scan 01/14/2015 FINDINGS: Study is markedly limited by streak artifacts from patient's large body habitus. Limited study without IV contrast. Images of the thoracic inlet are unremarkable. Central airways are patent. Cardiomegaly is noted. No pericardial effusion. The visualized upper abdomen is unremarkable. No significant hilar adenopathy. There is moderate to large bilateral pleural effusion. There is significant atelectasis bilateral lower lobe posteriorly. Elevation of the right hemidiaphragm is noted. There is nodular mass or adenopathy in right upper lobe medially. Best seen in axial image 16 measures about 3.7 cm. There is nodular pleural thickening in right lower lobe. Pleural disease cannot be excluded. Further correlation with enhanced study and analysis of pleural fluid is recommended. There is central ground-glass attenuation bilateral upper lobe suspicious for pulmonary edema or less likely alveolar infiltrates. No destructive bony lesions are noted. Sagittal images of the spine shows degenerative  changes thoracic spine. Fatty infiltration of the liver is noted. IMPRESSION: Markedly limited study without IV contrast. The study is limited by patient's large body habitus. 1. Bilateral moderate to large size pleural effusion. Bilateral lower lobe significant atelectasis. There is nodular pleural thickening in right lower lobe laterally see axial image 24. Pleural disease or metastatic disease cannot be excluded. 2. There is nodular mass or adenopathy in right upper lobe medially measures at least 3.7 cm. Further correlation with enhanced study is recommended. Pleural fluid analysis also recommended 3. Cardiomegaly is noted. 4. Degenerative changes thoracic spine. 5. There is central ground-glass attenuation bilateral upper lobes. Findings suspicious for pulmonary edema or less likely infiltrates. Electronically Signed   By: Lahoma Crocker M.D.   On: 03/10/2015 14:32   Dg Chest Portable 1  View  03/29/2015  CLINICAL DATA:  Subsequent encounter for lupus with concern for sepsis of unknown source. EXAM: PORTABLE CHEST 1 VIEW COMPARISON:  03/17/2015. FINDINGS: 1838 hours. The cardio pericardial silhouette is enlarged. Bibasilar atelectasis/ pneumonia persists with some improvement in aeration at the left base. No evidence for overt airspace pulmonary edema. Imaged bony structures of the thorax are intact. Telemetry leads overlie the chest. IMPRESSION: Cardiomegaly with left greater than right basilar atelectasis or pneumonia. Aeration at the left base is improved in the interval. Electronically Signed   By: Misty Stanley M.D.   On: 03/29/2015 18:47   Dg Chest Port 1 View  03/16/2015  CLINICAL DATA:  Fever, lupus, stroke, end-stage renal disease, TIA, hypertension, arrhythmias EXAM: PORTABLE CHEST 1 VIEW COMPARISON:  Portable exam 1108 hours compared to 03/11/2015 FINDINGS: Rotation to the LEFT. Enlargement of cardiac silhouette with pulmonary vascular congestion. Mediastinal contours normal. Bibasilar effusions.  Atelectasis versus consolidation LEFT lower lobe with minimal atelectasis at RIGHT base. Upper lungs clear. No pneumothorax. Bones unremarkable. IMPRESSION: Enlargement of cardiac silhouette with pulmonary vascular congestion. Bibasilar effusions with minimal RIGHT basilar atelectasis and more pronounced atelectasis versus consolidation in LEFT lower lobe. Electronically Signed   By: Lavonia Dana M.D.   On: 03/16/2015 11:29   Dg Chest Port 1 View  03/08/2015  CLINICAL DATA:  Intermittent shortness of breath.  Lupus. EXAM: PORTABLE CHEST 1 VIEW COMPARISON:  02/19/2015 FINDINGS: Perihilar airspace opacity and layering pleural effusions which are new. Chronic cardiopericardial enlargement. Stable upper mediastinal contours. Left-sided venous stenting. IMPRESSION: Perihilar airspace disease and layering pleural effusions, new from 02/19/2015 and likely overload/CHF. Electronically Signed   By: Monte Fantasia M.D.   On: 03/08/2015 12:01   US Thoracentesis Asp Pleural Space W/img Guide  03/17/2015  INDICATION: Symptomatic right sided pleural effusion EXAM: US THORACENTESIS ASP PLEURAL SPACE W/IMG GUIDE COMPARISON:  Thoracentesis 03/11/2015. MEDICATIONS: None COMPLICATIONS: None immediate TECHNIQUE: Informed written consent was obtained from the patient after a discussion of the risks, benefits and alternatives to treatment. A timeout was performed prior to the initiation of the procedure. Initial ultrasound scanning demonstrates a right loculated pleural effusion. The lower chest was prepped and draped in the usual sterile fashion. 1% lidocaine was used for local anesthesia. Under direct ultrasound guidance, a 19 gauge, 7-cm, Yueh catheter was introduced. An ultrasound image was saved for documentation purposes. The thoracentesis was performed. The catheter was removed and a dressing was applied. The patient tolerated the procedure well without immediate post procedural complication. The patient was escorted to  have an upright chest radiograph. FINDINGS: A total of approximately 60 ml of dark bloody fluid was removed. Requested samples were sent to the laboratory. IMPRESSION: Successful ultrasound-guided right sided thoracentesis yielding 60 ml of pleural fluid. Read By:  Tsosie Billing PA-C Electronically Signed   By: Marybelle Killings M.D.   On: 03/17/2015 10:32   US Thoracentesis Asp Pleural Space W/img Guide  03/11/2015  INDICATION: Symptomatic left sided pleural effusion EXAM: US THORACENTESIS ASP PLEURAL SPACE W/IMG GUIDE COMPARISON:  None. MEDICATIONS: 10 cc 1% lidocaine COMPLICATIONS: None immediate TECHNIQUE: Informed written consent was obtained from the patient after a discussion of the risks, benefits and alternatives to treatment. A timeout was performed prior to the initiation of the procedure. Initial ultrasound scanning demonstrates a left pleural effusion. The lower chest was prepped and draped in the usual sterile fashion. 1% lidocaine was used for local anesthesia. Under direct ultrasound guidance, a 19 gauge, 7-cm, Yueh catheter was  introduced. An ultrasound image was saved for documentation purposes. The thoracentesis was performed. The catheter was removed and a dressing was applied. The patient tolerated the procedure well without immediate post procedural complication. The patient was escorted to have an upright chest radiograph. FINDINGS: A total of approximately 300 cc of bloody fluid was removed. Requested samples were sent to the laboratory. IMPRESSION: Successful ultrasound-guided left sided thoracentesis yielding 300 cc of pleural fluid. Read by:  Lavonia Drafts [facility] Electronically Signed   By: Lucrezia Europe M.D.   On: 03/11/2015 14:59   Dx #1 coumadin tyoxicity #2hypoension 33 anemia #4 rectal bleeding #5 decubitus ulcers CRITICAL CARE Performed by: Orlie Dakin Total critical care time: 50 minutes Critical care time was exclusive of separately billable procedures and treating other  patients. Critical care was necessary to treat or prevent imminent or life-threatening deterioration. Critical care was time spent personally by me on the following activities: development of treatment plan with patient and/or surrogate as well as nursing, discussions with consultants, evaluation of patient's response to treatment, examination of patient, obtaining history from patient or surrogate, ordering and performing treatments and interventions, ordering and review of laboratory studies, ordering and review of radiographic studies, pulse oximetry and re-evaluation of patient's condition.  Orlie Dakin, MD 03/29/15 2233

## 2015-03-29 NOTE — Consult Note (Signed)
ANTIBIOTIC CONSULT NOTE - INITIAL  Pharmacy Consult for Vancomycin and Zosyn Indication: sepsis  Allergies  Allergen Reactions  . Food Swelling    Red peppers    Patient Measurements: Weight = 76kg Height = 5'3''  Vital Signs: Temp: 97.9 F (36.6 C) (12/30 1916) Temp Source: Rectal (12/30 1916) BP: 86/66 mmHg (12/30 2121) Pulse Rate: 106 (12/30 1800) Intake/Output from previous day:   Intake/Output from this shift:    Labs:  Recent Labs  03/29/15 1920  WBC 8.6  HGB 7.7*  PLT 112*  CREATININE 2.53*   Estimated Creatinine Clearance: 29.1 mL/min (by C-G formula based on Cr of 2.53).   Microbiology: Recent Results (from the past 720 hour(s))  MRSA PCR Screening     Status: None   Collection Time: 03/04/15  8:02 PM  Result Value Ref Range Status   MRSA by PCR NEGATIVE NEGATIVE Final    Comment:        The GeneXpert MRSA Assay (FDA approved for NASAL specimens only), is one component of a comprehensive MRSA colonization surveillance program. It is not intended to diagnose MRSA infection nor to guide or monitor treatment for MRSA infections.   MRSA PCR Screening     Status: None   Collection Time: 03/09/15  7:48 PM  Result Value Ref Range Status   MRSA by PCR NEGATIVE NEGATIVE Final    Comment:        The GeneXpert MRSA Assay (FDA approved for NASAL specimens only), is one component of a comprehensive MRSA colonization surveillance program. It is not intended to diagnose MRSA infection nor to guide or monitor treatment for MRSA infections.   Culture, body fluid-bottle     Status: None   Collection Time: 03/11/15  2:40 PM  Result Value Ref Range Status   Specimen Description FLUID LEFT PLEURAL  Final   Special Requests NONE  Final   Culture NO GROWTH 5 DAYS  Final   Report Status 03/16/2015 FINAL  Final  Gram stain     Status: None   Collection Time: 03/11/15  2:40 PM  Result Value Ref Range Status   Specimen Description FLUID LEFT PLEURAL   Final   Special Requests NONE  Final   Gram Stain   Final    MODERATE WBC PRESENT,BOTH PMN AND MONONUCLEAR NO ORGANISMS SEEN    Report Status 03/11/2015 FINAL  Final  Culture, blood (routine x 2)     Status: None   Collection Time: 03/16/15 10:45 AM  Result Value Ref Range Status   Specimen Description BLOOD RIGHT HAND  Final   Special Requests IN PEDIATRIC BOTTLE 0.5CC  Final   Culture  Setup Time   Final    GRAM POSITIVE COCCI IN CLUSTERS AEROBIC BOTTLE ONLY CRITICAL RESULT CALLED TO, READ BACK BY AND VERIFIED WITH: J ZHU 03/17/15 @ 0730 M VESTAL    Culture STAPHYLOCOCCUS SPECIES (COAGULASE NEGATIVE)  Final   Report Status 03/19/2015 FINAL  Final   Organism ID, Bacteria STAPHYLOCOCCUS SPECIES (COAGULASE NEGATIVE)  Final      Susceptibility   Staphylococcus species (coagulase negative) - MIC*    CIPROFLOXACIN >=8 RESISTANT Resistant     ERYTHROMYCIN >=8 RESISTANT Resistant     GENTAMICIN >=16 RESISTANT Resistant     OXACILLIN >=4 RESISTANT Resistant     TETRACYCLINE >=16 RESISTANT Resistant     VANCOMYCIN 1 SENSITIVE Sensitive     TRIMETH/SULFA 160 RESISTANT Resistant     CLINDAMYCIN >=8 RESISTANT Resistant     RIFAMPIN <=  0.5 SENSITIVE Sensitive     Inducible Clindamycin NEGATIVE Sensitive     * STAPHYLOCOCCUS SPECIES (COAGULASE NEGATIVE)  Culture, blood (routine x 2)     Status: None   Collection Time: 03/16/15 11:25 AM  Result Value Ref Range Status   Specimen Description BLOOD RIGHT FOREARM  Final   Special Requests BOTTLES DRAWN AEROBIC AND ANAEROBIC 5CC  Final   Culture  Setup Time   Final    GRAM POSITIVE COCCI IN CLUSTERS ANAEROBIC BOTTLE ONLY CRITICAL RESULT CALLED TO, READ BACK BY AND VERIFIED WITH: S LEE 03/17/15 @ 31 M VESTAL    Culture   Final    STAPHYLOCOCCUS SPECIES (COAGULASE NEGATIVE) SUSCEPTIBILITIES PERFORMED ON PREVIOUS CULTURE WITHIN THE LAST 5 DAYS.    Report Status 03/19/2015 FINAL  Final  Gram stain     Status: None   Collection Time:  03/17/15 10:27 AM  Result Value Ref Range Status   Specimen Description FLUID PLEURAL RIGHT  Final   Special Requests NONE  Final   Gram Stain   Final    RARE WBC PRESENT, PREDOMINANTLY MONONUCLEAR NO ORGANISMS SEEN    Report Status 03/17/2015 FINAL  Final  AFB culture with smear     Status: None (Preliminary result)   Collection Time: 03/17/15 10:27 AM  Result Value Ref Range Status   Specimen Description FLUID PLEURAL RIGHT  Final   Special Requests NONE  Final   Acid Fast Smear   Final    NO ACID FAST BACILLI SEEN Performed at [facility]    Culture   Final    CULTURE WILL BE EXAMINED FOR 6 WEEKS BEFORE ISSUING A FINAL REPORT Performed at [facility]    Report Status PENDING  Incomplete  Culture, body fluid-bottle     Status: None   Collection Time: 03/17/15 10:27 AM  Result Value Ref Range Status   Specimen Description FLUID PLEURAL RIGHT  Final   Special Requests NONE  Final   Culture NO GROWTH 5 DAYS  Final   Report Status 03/22/2015 FINAL  Final  Culture, Urine     Status: None   Collection Time: 03/17/15 11:21 AM  Result Value Ref Range Status   Specimen Description URINE, CATHETERIZED  Final   Special Requests NONE  Final   Culture   Final    >=100,000 COLONIES/mL CANDIDA ALBICANS 50,000 COLONIES/mL VANCOMYCIN RESISTANT ENTEROCOCCUS ISOLATED 2,000 COLONIES/mL KLEBSIELLA OXYTOCA Confirmed Extended Spectrum Beta-Lactamase Producer (ESBL)    Report Status 03/22/2015 FINAL  Final   Organism ID, Bacteria VANCOMYCIN RESISTANT ENTEROCOCCUS ISOLATED  Final   Organism ID, Bacteria KLEBSIELLA OXYTOCA  Final      Susceptibility   Klebsiella oxytoca - MIC*    AMPICILLIN >=32 RESISTANT Resistant     CEFAZOLIN >=64 RESISTANT Resistant     CEFTRIAXONE 8 RESISTANT Resistant     CIPROFLOXACIN 2 INTERMEDIATE Intermediate     GENTAMICIN <=1 SENSITIVE Sensitive     IMIPENEM <=0.25 SENSITIVE Sensitive     NITROFURANTOIN 32 SENSITIVE Sensitive      TRIMETH/SULFA <=20 SENSITIVE Sensitive     AMPICILLIN/SULBACTAM >=32 RESISTANT Resistant     PIP/TAZO >=128 RESISTANT Resistant     * 2,000 COLONIES/mL KLEBSIELLA OXYTOCA   Vancomycin resistant enterococcus isolated - MIC*    AMPICILLIN <=2 SENSITIVE Sensitive     LEVOFLOXACIN >=8 RESISTANT Resistant     NITROFURANTOIN <=16 SENSITIVE Sensitive     VANCOMYCIN >=32 RESISTANT Resistant     LINEZOLID 2 SENSITIVE Sensitive     *  50,000 COLONIES/mL VANCOMYCIN RESISTANT ENTEROCOCCUS ISOLATED  Blood culture (routine x 2)     Status: None   Collection Time: 03/18/15 12:39 PM  Result Value Ref Range Status   Specimen Description BLOOD RIGHT HAND  Final   Special Requests IN PEDIATRIC BOTTLE 1MLS  Final   Culture NO GROWTH 5 DAYS  Final   Report Status 03/23/2015 FINAL  Final  Blood culture (routine x 2)     Status: None   Collection Time: 03/18/15  1:02 PM  Result Value Ref Range Status   Specimen Description BLOOD RIGHT HAND  Final   Special Requests IN PEDIATRIC BOTTLE 3CCS  Final   Culture NO GROWTH 5 DAYS  Final   Report Status 03/23/2015 FINAL  Final  Culture, Urine     Status: None   Collection Time: 03/18/15  3:23 PM  Result Value Ref Range Status   Specimen Description URINE, RANDOM  Final   Special Requests vancomycin, ceftazidime Immunocompromised  Final   Culture   Final    >=100,000 COLONIES/mL CANDIDA ALBICANS 20,000 COLONIES/mL VANCOMYCIN RESISTANT ENTEROCOCCUS SUSCEPTIBILITIES PERFORMED ON PREVIOUS CULTURE WITHIN THE LAST 5 DAYS.    Report Status 03/23/2015 FINAL  Final   Organism ID, Bacteria VANCOMYCIN RESISTANT ENTEROCOCCUS  Final      Susceptibility   Vancomycin resistant enterococcus - MIC*    AMPICILLIN <=2 RESISTANT Resistant     LEVOFLOXACIN >=8 RESISTANT Resistant     NITROFURANTOIN <=16 SENSITIVE Sensitive     VANCOMYCIN >=32 RESISTANT Resistant     LINEZOLID 2 SENSITIVE Sensitive     * 20,000 COLONIES/mL VANCOMYCIN RESISTANT ENTEROCOCCUS    Medical  History: Past Medical History  Diagnosis Date  . Hypertension   . Cardiac arrhythmia   . SVT (supraventricular tachycardia) ([place])     "today, last week, 2 wk ago; maybe 1 month ago, etc; started w/in last 3-4 yrs"(07/20/2012)  . Fainting     "~ 1 month ago; probably related to SVT" (07/20/2012)  . Shortness of breath     "related to SVT episodes" (07/20/2012)  . Chest pain at rest     "related to SVT" (07/20/2012)  . Lupus (systemic lupus erythematosus) ([place])   . GERD (gastroesophageal reflux disease)   . Fibromyalgia   . Irritable bowel syndrome (IBS)   . Hypercholesterolemia   . Heart murmur     "small" (12/25/2014)  . TIA (transient ischemic attack) 12/21/2014  . Sleep apnea     "don't wear mask anymore" (12/25/2014)  . Anemia   . History of blood transfusion "several"    "related to low counts"  . Daily headache   . Arthritis     "hips" (12/25/2014)  . Anxiety     "sometimes; don't take anything for it" (12/25/2014)  . 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  Repeat biopsy early 2016 membranous w/o activity so meds weaned off. Big flare Aug '16 creat 6, 3rd biopsy DPGN, meds resumed. Ended up starting HD Sept 2016  . Stroke ([place])   . History of vascular access device     2016: Sept 9  R IJ cath per IR.  Sept 20 not candidate for fistula due to disease veins, had LUA hybrid graft placed. Sept 21 - steal syndrome, LUA AVG ligated. Oct 7 - new left femoral Gore-tex loop graft per VVS. Oct 29 - removal R IJ tunneled HD cath  Assessment: 39yof with ESRD on HD TTS, last dialyzed 12/29, who was just discharged on 12/22 after admission for UTI/pneumonia/bacteremia presents to the ED with encephalopathy, hypotension, and elevated lactic acid. CXR shows left > right basilar atelectasis or pneumonia. Of note, she was discharged on vancomycin and ceftazidime and was to continue both until  today. Last doses given 12/29 with dialysis.  She will now resume vancomycin and begin zosyn for suspected sepsis.  Goal of Therapy:  Pre-HD level 15-25  Plan:  1) Continue vancomycin 750mg  IV qHD 2) Zosyn 3.375g IV x 1 as ordered then 2.25g IV q8 3) Follow HD schedule  Deboraha Sprang 03/29/2015,9:34 PM

## 2015-03-29 NOTE — ED Notes (Signed)
This RN attempted to start an IV with blood draw and was unsuccessful; IV team attempted to draw blood with IV start and were unable. IV access established, phlebotomy notified about the need for lab draws.

## 2015-03-29 NOTE — ED Notes (Signed)
Patient transported to CT 

## 2015-03-29 NOTE — ED Provider Notes (Signed)
CSN: [id number]     Arrival date & time 03/29/15  1700 History   First MD Initiated Contact with Patient 03/29/15 1734     Chief Complaint  Patient presents with  . Rash     (Consider location/radiation/quality/duration/timing/severity/associated sxs/prior Treatment) HPI   Patient to the ER by EMS from where she stays at [facility], she is chronically ill due to stroke, lupus, dialysis patient and encephalopathy. He is recently discharged from the hospital for sepsis and hospital-acquired pneumonia, UTI and she grew gram-negative cocci in clusters on her blood culture. She was released from the hospital one week ago from yesterday. She states that she has been doing okay since then his recently developed severe rash to her entire body that is red and splotchy, dark bruise like rash significant pain to her sacral region with tears. She denies having any fevers at home but has felt cold. This does not feel like her usual exacerbation of pain. Per EMS her vital signs are normal but I was informed by nurse on reevaluation her blood pressure is low at 88/57. Per mother her blood pressure typically runs low after dialysis or during a lupus flare consistent with her blood pressure right now. The mom called the patient's rheumatologist whose concern for lupus flares and she has new onset rash. He is also tachycardic at 108 is currently afebrile with oxygen saturation at 99% on room air. Patient is a poor historian, she is unable to provide much information on her medical history, medications and why she feels poorly.  Dialysis Tues, Thurs, Sat- last dialyzed yesterday.  Past Medical History  Diagnosis Date  . Hypertension   . Cardiac arrhythmia   . SVT (supraventricular tachycardia) ([place])     "today, last week, 2 wk ago; maybe 1 month ago, etc; started w/in last 3-4 yrs"(07/20/2012)  . Fainting     "~ 1 month ago; probably related to SVT" (07/20/2012)  . Shortness of breath     "related to SVT  episodes" (07/20/2012)  . Chest pain at rest     "related to SVT" (07/20/2012)  . Lupus (systemic lupus erythematosus) ([place])   . GERD (gastroesophageal reflux disease)   . Fibromyalgia   . Irritable bowel syndrome (IBS)   . Hypercholesterolemia   . Heart murmur     "small" (12/25/2014)  . TIA (transient ischemic attack) 12/21/2014  . Sleep apnea     "don't wear mask anymore" (12/25/2014)  . Anemia   . History of blood transfusion "several"    "related to low counts"  . Daily headache   . Arthritis     "hips" (12/25/2014)  . Anxiety     "sometimes; don't take anything for it" (12/25/2014)  . 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  Repeat biopsy early 2016 membranous w/o activity so meds weaned off. Big flare Aug '16 creat 6, 3rd biopsy DPGN, meds resumed. Ended up starting HD Sept 2016  . Stroke ([place])   . History of vascular access device     2016: Sept 9  R IJ cath per IR.  Sept 20 not candidate for fistula due to disease veins, had LUA hybrid graft placed. Sept 21 - steal syndrome, LUA AVG ligated. Oct 7 - new left femoral Gore-tex loop graft per [facility]. Oct 29 - removal R IJ tunneled HD cath   Past Surgical History  Procedure Laterality Date  .  Cesarean section  2001; 2010  . Cervical biopsy  2013  . Supraventricular tachycardia ablation  07/21/12     Dr. Cristopher Peru   . Peripheral vascular catheterization N/A 12/14/2014    Procedure: Upper Extremity Venography;  Surgeon: Elam Dutch, MD;  Location: [place] [facility];  Service: Cardiovascular;  Laterality: N/A;  . Insertion hybrid anteriovenous gortex graft Left 12/18/2014    Procedure: INSERTION GORE HYBRID ARTERIOVENOUS GRAFT LEFT AXILLO-BRACHIAL.;  Surgeon: Serafina Mitchell, MD;  Location: [facility] OR;  Service: Vascular;  Laterality: Left;  . Ligation of arteriovenous  fistula Left 12/19/2014    Procedure: LIGATION OF FISTULA;  Surgeon:  Elam Dutch, MD;  Location: [facility];  Service: Vascular;  Laterality: Left;  . Appendectomy  2011  . Tubal ligation  2010  . Tee without cardioversion N/A 12/25/2014    Procedure: TRANSESOPHAGEAL ECHOCARDIOGRAM (TEE);  Surgeon: Sueanne Margarita, MD;  Location: [facility];  Service: Cardiovascular;  Laterality: N/A;  . Av fistula placement Left 01/04/2015    Procedure: INSERTION OF ARTERIOVENOUS (AV) GORE-TEX GRAFT THIGH;  Surgeon: Rosetta Posner, MD;  Location: [facility];  Service: Vascular;  Laterality: Left;  . Tee without cardioversion N/A 01/30/2015    Procedure: TRANSESOPHAGEAL ECHOCARDIOGRAM (TEE);  Surgeon: Lelon Perla, MD;  Location: [facility];  Service: Cardiovascular;  Laterality: N/A;   Family History  Problem Relation Age of Onset  . Cancer Mother     breast and ovarian  . BRCA 1/2 Sister    Social History  Substance Use Topics  . Smoking status: Never Smoker   . Smokeless tobacco: Never Used  . Alcohol Use: No     Comment: 07/20/2012 "might have a beer once/yr"   OB History    Gravida Para Term Preterm AB TAB SAB Ectopic Multiple Living   _0 0 0 0 0 0 0 2     Review of Systems  Review of Systems All other systems negative except as documented in the HPI. All pertinent positives and negatives as reviewed in the HPI.   Allergies  Food  Home Medications   Prior to Admission medications   Medication Sig Start Date End Date Taking? Authorizing Provider  Amino Acids-Protein Hydrolys (FEEDING SUPPLEMENT, PRO-STAT SUGAR FREE 64,) LIQD Take 30 mLs by mouth daily. 03/22/15  Yes Jule Ser, DO  Calcium Carbonate Antacid (TUMS PO) Take 1-2 tablets by mouth daily as needed (heartburn).   Yes Historical Provider, MD  cefTAZidime 2 g in dextrose 5 % 50 mL Inject 2 g into the vein Every Tuesday,Thursday,and Saturday with dialysis. 03/22/15  Yes Jule Ser, DO  cyclobenzaprine (FLEXERIL) 10 MG tablet Take 1 tablet (10 mg total) by mouth at bedtime as needed for  muscle spasms. 01/17/15  Yes Daniel J Angiulli, PA-C  Darbepoetin Alfa (ARANESP) 200 MCG/0.4ML SOSY injection Inject 0.4 mLs (200 mcg total) into the vein every Thursday with hemodialysis. 02/04/15  Yes Collier Salina, MD  DULoxetine 40 MG CPEP Take 40 mg by mouth daily. 03/22/15  Yes Jule Ser, DO  feeding supplement (BOOST / RESOURCE BREEZE) LIQD Take 1 Container by mouth daily. 03/22/15  Yes Jule Ser, DO  ferric gluconate 125 mg in sodium chloride 0.9 % 100 mL Inject 125 mg into the vein Every Tuesday,Thursday,and Saturday with dialysis. 03/22/15 04/04/15 Yes Jule Ser, DO  hydroxychloroquine (PLAQUENIL) 200 MG tablet Take 2 tablets (400 mg total) by mouth daily. 01/17/15  Yes Daniel J Angiulli, PA-C  METOPROLOL TARTRATE  PO Take 25 mg by mouth daily. Take 0.5 tablet daily for hypertension   Yes Historical Provider, MD  midodrine (PROAMATINE) 5 MG tablet Take 1 tablet (5 mg total) by mouth 2 (two) times daily with a meal. 03/22/15  Yes Jule Ser, DO  multivitamin (RENA-VIT) TABS tablet Take 1 tablet by mouth at bedtime. 01/17/15  Yes Daniel J Angiulli, PA-C  OLANZapine zydis (ZYPREXA) 10 MG disintegrating tablet Take 1 tablet (10 mg total) by mouth 2 (two) times daily after a meal. 03/22/15  Yes Jule Ser, DO  pantoprazole (PROTONIX) 20 MG tablet Take 1 tablet (20 mg total) by mouth daily. 01/17/15  Yes Daniel J Angiulli, PA-C  trimethobenzamide (TIGAN) 300 MG capsule Take 1 capsule (300 mg total) by mouth every 8 (eight) hours as needed for nausea/vomiting. 02/04/15  Yes Collier Salina, MD  Vancomycin (VANCOCIN) 750 MG/150ML SOLN Inject 150 mLs (750 mg total) into the vein Every Tuesday,Thursday,and Saturday with dialysis. 03/22/15  Yes Jule Ser, DO  warfarin (COUMADIN) 2.5 MG tablet Take 1 tablet (2.5 mg total) by mouth one time only at 6 PM. 03/22/15  Yes Jule Ser, DO  atorvastatin (LIPITOR) 20 MG tablet Take 1 tablet (20 mg total) by mouth daily at 6 PM.  01/17/15   Lavon Paganini Angiulli, PA-C  metoprolol succinate (TOPROL-XL) 25 MG 24 hr tablet Take 0.5 tablets (12.5 mg total) by mouth daily at 6 PM. 03/22/15   Jule Ser, DO  oxyCODONE-acetaminophen (PERCOCET/ROXICET) 5-325 MG tablet Take 1 tablet by mouth every 6 (six) hours as needed for severe pain. 03/22/15   Jule Ser, DO  polyethylene glycol (MIRALAX / GLYCOLAX) packet Take 17 g by mouth daily as needed for mild constipation. 02/04/15   Collier Salina, MD   BP 73/63 mmHg  Pulse 106  Temp(Src) 97.9 F (36.6 C) (Rectal)  Resp 19  SpO2 100%  LMP 03/18/2015 Physical Exam  Constitutional: She appears well-developed and well-nourished. She has a sickly appearance.  HENT:  Head: Atraumatic.  Right Ear: External ear normal.  Left Ear: External ear normal.  Nose: Nose normal.  Mouth/Throat: Uvula is midline and oropharynx is clear and moist.  Eyes: Conjunctivae and lids are normal. Pupils are equal, round, and reactive to light.  Neck: No spinous process tenderness and no muscular tenderness present.  Cardiovascular: Tachycardia present.   Pulmonary/Chest: She has decreased breath sounds.  Abdominal: Bowel sounds are normal. There is no tenderness.  Genitourinary: Guaiac positive stool.  Skin breakdown to back and sacral region, tender to the touch without ulceration. Large amount of thick, flaky skin with areas or erythema and loss of epidermis.   Neurological: She is alert.  Pt mentation is at baseline per her chronic neurologic deficiencies but mother reports she is responding slower than normal.  Skin:  Significant rash to entire body it is red and diffuse, coalsces in some placed. She also has multiple large areas of dark rash to upper arms and back. The rashes do not blanch.    ED Course  Procedures (including critical care time) Labs Review Labs Reviewed  CBC WITH DIFFERENTIAL/PLATELET - Abnormal; Notable for the following:    RBC 2.42 (*)    Hemoglobin 7.7 (*)     HCT 23.2 (*)    RDW 21.2 (*)    Platelets 112 (*)    Lymphs Abs 0.3 (*)    Basophils Absolute 0.2 (*)    All other components within normal limits  COMPREHENSIVE METABOLIC PANEL - Abnormal; Notable  for the following:    Sodium 134 (*)    Potassium 3.3 (*)    Chloride 95 (*)    BUN <5 (*)    Creatinine, Ser 2.53 (*)    Calcium 8.7 (*)    Total Protein 5.6 (*)    Albumin 1.6 (*)    AST 49 (*)    GFR calc non Af Amer 23 (*)    GFR calc Af Amer 26 (*)    All other components within normal limits  PROTIME-INR - Abnormal; Notable for the following:    Prothrombin Time 49.3 (*)    INR 5.67 (*)    All other components within normal limits  APTT - Abnormal; Notable for the following:    aPTT 61 (*)    All other components within normal limits  POC OCCULT BLOOD, ED - Abnormal; Notable for the following:    Fecal Occult Bld POSITIVE (*)    All other components within normal limits  I-STAT CG4 LACTIC ACID, ED - Abnormal; Notable for the following:    Lactic Acid, Venous 2.37 (*)    All other components within normal limits  CULTURE, BLOOD (ROUTINE X 2)  CULTURE, BLOOD (ROUTINE X 2)  URINE CULTURE  URINALYSIS, ROUTINE W REFLEX MICROSCOPIC (NOT AT [facility])  I-STAT CG4 LACTIC ACID, ED  PREPARE FRESH FROZEN PLASMA    Imaging Review Dg Chest Portable 1 View  03/29/2015  CLINICAL DATA:  Subsequent encounter for lupus with concern for sepsis of unknown source. EXAM: PORTABLE CHEST 1 VIEW COMPARISON:  03/17/2015. FINDINGS: 1838 hours. The cardio pericardial silhouette is enlarged. Bibasilar atelectasis/ pneumonia persists with some improvement in aeration at the left base. No evidence for overt airspace pulmonary edema. Imaged bony structures of the thorax are intact. Telemetry leads overlie the chest. IMPRESSION: Cardiomegaly with left greater than right basilar atelectasis or pneumonia. Aeration at the left base is improved in the interval. Electronically Signed   By: Misty Stanley M.D.   On:  03/29/2015 18:47   I have personally reviewed and evaluated these images and lab results as part of my medical decision-making.   EKG Interpretation   Date/Time:  Friday March 29 2015 18:32:46 EST Ventricular Rate:  105 PR Interval:  157 QRS Duration: 90 QT Interval:  393 QTC Calculation: 519 R Axis:   11 Text Interpretation:  Sinus tachycardia Borderline T abnormalities,  diffuse leads Prolonged QT interval No significant change since last  tracing Confirmed by Winfred Leeds  MD, SAM [phone]) on 03/29/2015 6:59:35 PM      MDM   Final diagnoses:  None   We had difficulty obtaining IV on patient initially and IV team was consulted. Sepsis labs due to low blood pressure but the patient is afebrile and has hx of elevated INR, concern for bleeding.   Patient care transferred to Dr. Winfred Leeds, he will now assume care of labs/imaging results, medications and admission.     Delos Haring, PA-C 03/29/15 2048  Delos Haring, PA-C 03/29/15 2121  Orlie Dakin, MD 03/30/15 [phone]

## 2015-03-30 DIAGNOSIS — Z992 Dependence on renal dialysis: Secondary | ICD-10-CM

## 2015-03-30 DIAGNOSIS — N186 End stage renal disease: Secondary | ICD-10-CM | POA: Insufficient documentation

## 2015-03-30 LAB — CBC
HCT: 21.3 % — ABNORMAL LOW (ref 36.0–46.0)
HCT: 25.4 % — ABNORMAL LOW (ref 36.0–46.0)
HEMOGLOBIN: 7.5 g/dL — AB (ref 12.0–15.0)
Hemoglobin: 6.7 g/dL — CL (ref 12.0–15.0)
MCH: 30.3 pg (ref 26.0–34.0)
MCH: 28.2 pg (ref 26.0–34.0)
MCHC: 31.5 g/dL (ref 30.0–36.0)
MCHC: 29.5 g/dL — AB (ref 30.0–36.0)
MCV: 96.4 fL (ref 78.0–100.0)
MCV: 95.5 fL (ref 78.0–100.0)
PLATELETS: 36 10*3/uL — AB (ref 150–400)
Platelets: 45 10*3/uL — ABNORMAL LOW (ref 150–400)
RBC: 2.21 MIL/uL — ABNORMAL LOW (ref 3.87–5.11)
RBC: 2.66 MIL/uL — AB (ref 3.87–5.11)
RDW: 19.4 % — AB (ref 11.5–15.5)
RDW: 19.6 % — ABNORMAL HIGH (ref 11.5–15.5)
WBC: 3.4 10*3/uL — ABNORMAL LOW (ref 4.0–10.5)
WBC: 4.2 10*3/uL (ref 4.0–10.5)

## 2015-03-30 LAB — URINALYSIS, ROUTINE W REFLEX MICROSCOPIC
GLUCOSE, UA: NEGATIVE mg/dL
Ketones, ur: 15 mg/dL — AB
NITRITE: POSITIVE — AB
PH: 6.5 (ref 5.0–8.0)
Protein, ur: >300 mg/dL — AB
SPECIFIC GRAVITY, URINE: 1.017 (ref 1.005–1.030)

## 2015-03-30 LAB — PREPARE RBC (CROSSMATCH)

## 2015-03-30 LAB — PROTIME-INR
INR: 2.25 — AB (ref 0.00–1.49)
Prothrombin Time: 24.7 seconds — ABNORMAL HIGH (ref 11.6–15.2)

## 2015-03-30 LAB — COMPREHENSIVE METABOLIC PANEL
ALT: 20 U/L (ref 14–54)
AST: 27 U/L (ref 15–41)
Albumin: 1.9 g/dL — ABNORMAL LOW (ref 3.5–5.0)
Alkaline Phosphatase: 89 U/L (ref 38–126)
Anion gap: 12 (ref 5–15)
BUN: 6 mg/dL (ref 6–20)
CALCIUM: 9.2 mg/dL (ref 8.9–10.3)
CHLORIDE: 96 mmol/L — AB (ref 101–111)
CO2: 29 mmol/L (ref 22–32)
CREATININE: 2.71 mg/dL — AB (ref 0.44–1.00)
GFR, EST AFRICAN AMERICAN: 24 mL/min — AB (ref >60)
GFR, EST NON AFRICAN AMERICAN: 21 mL/min — AB (ref >60)
Glucose, Bld: 64 mg/dL — ABNORMAL LOW (ref 65–99)
POTASSIUM: 2.4 mmol/L — AB (ref 3.5–5.1)
Sodium: 137 mmol/L (ref 135–145)
Total Bilirubin: 0.9 mg/dL (ref 0.3–1.2)
Total Protein: 5.7 g/dL — ABNORMAL LOW (ref 6.5–8.1)

## 2015-03-30 LAB — URINE MICROSCOPIC-ADD ON

## 2015-03-30 LAB — LACTIC ACID, PLASMA: Lactic Acid, Venous: 1 mmol/L (ref 0.5–2.0)

## 2015-03-30 LAB — MRSA PCR SCREENING: MRSA BY PCR: NEGATIVE

## 2015-03-30 MED ORDER — RENA-VITE PO TABS
1.0000 | ORAL_TABLET | Freq: Every day | ORAL | Status: DC
  Administered 2015-03-30 – 2015-04-02 (×4): 1 via ORAL
  Filled 2015-03-30 (×4): qty 1

## 2015-03-30 MED ORDER — OLANZAPINE 10 MG PO TBDP
10.0000 mg | ORAL_TABLET | Freq: Two times a day (BID) | ORAL | Status: DC
  Administered 2015-03-30: 10 mg via ORAL
  Filled 2015-03-30 (×3): qty 1

## 2015-03-30 MED ORDER — POTASSIUM CHLORIDE 20 MEQ/15ML (10%) PO SOLN
40.0000 meq | Freq: Once | ORAL | Status: AC
  Administered 2015-03-30: 40 meq via ORAL
  Filled 2015-03-30: qty 30

## 2015-03-30 MED ORDER — ALTEPLASE 2 MG IJ SOLR
2.0000 mg | Freq: Once | INTRAMUSCULAR | Status: DC | PRN
  Filled 2015-03-30: qty 2

## 2015-03-30 MED ORDER — DULOXETINE HCL 20 MG PO CPEP
40.0000 mg | ORAL_CAPSULE | Freq: Every day | ORAL | Status: DC
  Administered 2015-03-30 – 2015-04-03 (×5): 40 mg via ORAL
  Filled 2015-03-30 (×6): qty 2

## 2015-03-30 MED ORDER — LIDOCAINE-PRILOCAINE 2.5-2.5 % EX CREA
1.0000 "application " | TOPICAL_CREAM | CUTANEOUS | Status: DC | PRN
  Filled 2015-03-30: qty 5

## 2015-03-30 MED ORDER — AQUAPHOR EX OINT
TOPICAL_OINTMENT | Freq: Two times a day (BID) | CUTANEOUS | Status: DC
  Administered 2015-03-30 – 2015-04-03 (×9): via TOPICAL
  Filled 2015-03-30: qty 50

## 2015-03-30 MED ORDER — SODIUM CHLORIDE 0.9 % IV SOLN: 100.0000 mL | INTRAVENOUS | Status: DC | PRN

## 2015-03-30 MED ORDER — SODIUM CHLORIDE 0.9 % IV SOLN: Freq: Once | INTRAVENOUS | Status: DC

## 2015-03-30 MED ORDER — CETYLPYRIDINIUM CHLORIDE 0.05 % MT LIQD
7.0000 mL | Freq: Two times a day (BID) | OROMUCOSAL | Status: DC
  Administered 2015-03-30 – 2015-04-02 (×9): 7 mL via OROMUCOSAL

## 2015-03-30 MED ORDER — POLYETHYLENE GLYCOL 3350 17 G PO PACK: 17.0000 g | PACK | Freq: Every day | ORAL | Status: DC | PRN

## 2015-03-30 MED ORDER — MIDODRINE HCL 5 MG PO TABS
5.0000 mg | ORAL_TABLET | Freq: Two times a day (BID) | ORAL | Status: DC
  Administered 2015-03-31 – 2015-04-03 (×7): 5 mg via ORAL
  Filled 2015-03-30 (×7): qty 1

## 2015-03-30 MED ORDER — LIDOCAINE HCL (PF) 1 % IJ SOLN: 5.0000 mL | INTRAMUSCULAR | Status: DC | PRN

## 2015-03-30 MED ORDER — PANTOPRAZOLE SODIUM 20 MG PO TBEC
20.0000 mg | DELAYED_RELEASE_TABLET | Freq: Every day | ORAL | Status: DC
  Administered 2015-03-31 – 2015-04-03 (×4): 20 mg via ORAL
  Filled 2015-03-30 (×4): qty 1

## 2015-03-30 MED ORDER — POTASSIUM CHLORIDE CRYS ER 20 MEQ PO TBCR
40.0000 meq | EXTENDED_RELEASE_TABLET | Freq: Once | ORAL | Status: DC
Start: 2015-03-30 — End: 2015-03-30

## 2015-03-30 MED ORDER — HEPARIN SODIUM (PORCINE) 1000 UNIT/ML DIALYSIS: 1000.0000 [IU] | INTRAMUSCULAR | Status: DC | PRN

## 2015-03-30 MED ORDER — HYDROXYCHLOROQUINE SULFATE 200 MG PO TABS
400.0000 mg | ORAL_TABLET | Freq: Every day | ORAL | Status: DC
  Administered 2015-03-30 – 2015-04-03 (×5): 400 mg via ORAL
  Filled 2015-03-30 (×5): qty 2

## 2015-03-30 MED ORDER — PENTAFLUOROPROP-TETRAFLUOROETH EX AERO: 1.0000 "application " | INHALATION_SPRAY | CUTANEOUS | Status: DC | PRN

## 2015-03-30 MED ORDER — ATORVASTATIN CALCIUM 20 MG PO TABS
20.0000 mg | ORAL_TABLET | Freq: Every day | ORAL | Status: DC
  Administered 2015-03-31 – 2015-04-02 (×3): 20 mg via ORAL
  Filled 2015-03-30 (×3): qty 1

## 2015-03-30 NOTE — Progress Notes (Signed)
CRITICAL VALUE ALERT  Critical value received:  Potassium 2.4  Date of notification:  03/30/15  Time of notification:  0624  Critical value read back:Yes.    Nurse who received alert:  Alben Deeds, RN  MD notified (1st page):  Merrilyn Puma, MD  Time of first page:  [phone]  Responding MD:  Merrilyn Puma  Time MD responded:  [phone]  MD aware. No orders received for replacement due to patient's scheduled dialysis today. Will continue to monitor.

## 2015-03-30 NOTE — Progress Notes (Signed)
CRITICAL VALUE ALERT  Critical value received:  Hemoglobin 6.7  Date of notification:  03/30/15  Time of notification:  0725  Critical value read back:Yes.    Nurse who received alert:  Martinique Perkins  MD notified (1st page):  Evette Doffing, MD  Responding MD:  Evette Doffing  Time MD responded:  0730   Informed that MD will be here shortly.

## 2015-03-30 NOTE — Consult Note (Addendum)
Renal Service Consult Note [facility]  [name] 03/30/2015 [name] D Requesting Physician:  Dr Evette Doffing   Reason for Consult:  ESRD patient w  HPI: The patient is a 39 y.o. year-old with new diffuse rash. She has ESRD recently started dialysis this past fall, SLE w multiple complications and was in the hospital here from Nov 22nd thru Dec 23rd. She present now w diffuse skin rash.  Has been getting HD using [facility] lift transporting from a SNF.  INR was high on admission. Asked to see for dialysis.    ROS  denies SOB/ CP, n/v/d, abd pain  no joint pain  gets to HD by full lift w [employer] apparatus  lives at [facility]  no HA or blurred vision  hx CVA w left HP  Past Medical History  Past Medical History  Diagnosis Date  . Hypertension   . Cardiac arrhythmia   . SVT (supraventricular tachycardia) ([place])     "today, last week, 2 wk ago; maybe 1 month ago, etc; started w/in last 3-4 yrs"(07/20/2012)  . Fainting     "~ 1 month ago; probably related to SVT" (07/20/2012)  . Shortness of breath     "related to SVT episodes" (07/20/2012)  . Chest pain at rest     "related to SVT" (07/20/2012)  . Lupus (systemic lupus erythematosus) ([place])   . GERD (gastroesophageal reflux disease)   . Fibromyalgia   . Irritable bowel syndrome (IBS)   . Hypercholesterolemia   . Heart murmur     "small" (12/25/2014)  . TIA (transient ischemic attack) 12/21/2014  . Sleep apnea     "don't wear mask anymore" (12/25/2014)  . Anemia   . History of blood transfusion "several"    "related to low counts"  . Daily headache   . Arthritis     "hips" (12/25/2014)  . Anxiety     "sometimes; don't take anything for it" (12/25/2014)  . 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  Repeat biopsy early 2016 membranous w/o activity so meds weaned off. Big flare Aug '16 creat 6, 3rd biopsy DPGN, meds  resumed. Ended up starting HD Sept 2016  . Stroke ([place])   . History of vascular access device     2016: Sept 9  R IJ cath per IR.  Sept 20 not candidate for fistula due to disease veins, had LUA hybrid graft placed. Sept 21 - steal syndrome, LUA AVG ligated. Oct 7 - new left femoral Gore-tex loop graft per VVS. Oct 29 - removal R IJ tunneled HD cath   Past Surgical History  Past Surgical History  Procedure Laterality Date  . Cesarean section  2001; 2010  . Cervical biopsy  2013  . Supraventricular tachycardia ablation  07/21/12     Dr. Cristopher Peru   . Peripheral vascular catheterization N/A 12/14/2014    Procedure: Upper Extremity Venography;  Surgeon: Elam Dutch, MD;  Location: [place] [facility];  Service: Cardiovascular;  Laterality: N/A;  . Insertion hybrid anteriovenous gortex graft Left 12/18/2014    Procedure: INSERTION GORE HYBRID ARTERIOVENOUS GRAFT LEFT AXILLO-BRACHIAL.;  Surgeon: Serafina Mitchell, MD;  Location: [facility] OR;  Service: Vascular;  Laterality: Left;  . Ligation of arteriovenous  fistula Left 12/19/2014    Procedure: LIGATION OF FISTULA;  Surgeon: Elam Dutch, MD;  Location: [place];  Service: Vascular;  Laterality: Left;  .  Appendectomy  2011  . Tubal ligation  2010  . Tee without cardioversion N/A 12/25/2014    Procedure: TRANSESOPHAGEAL ECHOCARDIOGRAM (TEE);  Surgeon: Sueanne Margarita, MD;  Location: [place];  Service: Cardiovascular;  Laterality: N/A;  . Av fistula placement Left 01/04/2015    Procedure: INSERTION OF ARTERIOVENOUS (AV) GORE-TEX GRAFT THIGH;  Surgeon: Rosetta Posner, MD;  Location: [place];  Service: Vascular;  Laterality: Left;  . Tee without cardioversion N/A 01/30/2015    Procedure: TRANSESOPHAGEAL ECHOCARDIOGRAM (TEE);  Surgeon: Lelon Perla, MD;  Location: [facility];  Service: Cardiovascular;  Laterality: N/A;   Family History  Family History  Problem Relation Age of Onset  . Cancer Mother     breast and ovarian  . BRCA 1/2 Sister     Social History  reports that she has never smoked. She has never used smokeless tobacco. She reports that she does not drink alcohol or use illicit drugs. Allergies  Allergies  Allergen Reactions  . Food Swelling    Red peppers   Home medications Prior to Admission medications   Medication Sig Start Date End Date Taking? Authorizing Provider  Amino Acids-Protein Hydrolys (FEEDING SUPPLEMENT, PRO-STAT SUGAR FREE 64,) LIQD Take 30 mLs by mouth daily. 03/22/15  Yes Jule Ser, DO  Calcium Carbonate Antacid (TUMS PO) Take 1-2 tablets by mouth daily as needed (heartburn).   Yes Historical Provider, MD  cefTAZidime 2 g in dextrose 5 % 50 mL Inject 2 g into the vein Every Tuesday,Thursday,and Saturday with dialysis. 03/22/15  Yes Jule Ser, DO  cyclobenzaprine (FLEXERIL) 10 MG tablet Take 1 tablet (10 mg total) by mouth at bedtime as needed for muscle spasms. 01/17/15  Yes Daniel J Angiulli, PA-C  Darbepoetin Alfa (ARANESP) 200 MCG/0.4ML SOSY injection Inject 0.4 mLs (200 mcg total) into the vein every Thursday with hemodialysis. 02/04/15  Yes Collier Salina, MD  DULoxetine 40 MG CPEP Take 40 mg by mouth daily. 03/22/15  Yes Jule Ser, DO  feeding supplement (BOOST / RESOURCE BREEZE) LIQD Take 1 Container by mouth daily. 03/22/15  Yes Jule Ser, DO  hydroxychloroquine (PLAQUENIL) 200 MG tablet Take 2 tablets (400 mg total) by mouth daily. 01/17/15  Yes Daniel J Angiulli, PA-C  METOPROLOL TARTRATE PO Take 25 mg by mouth daily. Take 0.5 tablet daily for hypertension   Yes Historical Provider, MD  midodrine (PROAMATINE) 5 MG tablet Take 1 tablet (5 mg total) by mouth 2 (two) times daily with a meal. 03/22/15  Yes Jule Ser, DO  multivitamin (RENA-VIT) TABS tablet Take 1 tablet by mouth at bedtime. 01/17/15  Yes Daniel J Angiulli, PA-C  OLANZapine zydis (ZYPREXA) 10 MG disintegrating tablet Take 1 tablet (10 mg total) by mouth 2 (two) times daily after a meal. 03/22/15  Yes  Jule Ser, DO  pantoprazole (PROTONIX) 20 MG tablet Take 1 tablet (20 mg total) by mouth daily. 01/17/15  Yes Daniel J Angiulli, PA-C  trimethobenzamide (TIGAN) 300 MG capsule Take 1 capsule (300 mg total) by mouth every 8 (eight) hours as needed for nausea/vomiting. 02/04/15  Yes Collier Salina, MD  Vancomycin (VANCOCIN) 750 MG/150ML SOLN Inject 150 mLs (750 mg total) into the vein Every Tuesday,Thursday,and Saturday with dialysis. 03/22/15  Yes Jule Ser, DO  warfarin (COUMADIN) 2.5 MG tablet Take 1 tablet (2.5 mg total) by mouth one time only at 6 PM. 03/22/15  Yes Jule Ser, DO  atorvastatin (LIPITOR) 20 MG tablet Take 1 tablet (20 mg total) by mouth daily at  6 PM. 01/17/15   Lavon Paganini Angiulli, PA-C  ferric gluconate 125 mg in sodium chloride 0.9 % 100 mL Inject 125 mg into the vein Every Tuesday,Thursday,and Saturday with dialysis. Patient not taking: Reported on 03/29/2015 03/22/15 04/04/15  Jule Ser, DO  metoprolol succinate (TOPROL-XL) 25 MG 24 hr tablet Take 0.5 tablets (12.5 mg total) by mouth daily at 6 PM. 03/22/15   Jule Ser, DO  oxyCODONE-acetaminophen (PERCOCET/ROXICET) 5-325 MG tablet Take 1 tablet by mouth every 6 (six) hours as needed for severe pain. 03/22/15   Jule Ser, DO  polyethylene glycol (MIRALAX / GLYCOLAX) packet Take 17 g by mouth daily as needed for mild constipation. 02/04/15   Collier Salina, MD   Liver Function Tests  Recent Labs Lab 03/29/15 1920 03/30/15 0524  AST 49* 27  ALT 21 20  ALKPHOS 97 89  BILITOT 0.8 0.9  PROT 5.6* 5.7*  ALBUMIN 1.6* 1.9*   No results for input(s): LIPASE, AMYLASE in the last 168 hours. CBC  Recent Labs Lab 03/29/15 1920 03/30/15 0524  WBC 8.6 3.4*  NEUTROABS 7.4  --   HGB 7.7* 6.7*  HCT 23.2* 21.3*  MCV 95.9 96.4  PLT 112* 36*   Basic Metabolic Panel  Recent Labs Lab 03/29/15 1920 03/30/15 0524  NA 134* 137  K 3.3* 2.4*  CL 95* 96*  CO2 28 29  GLUCOSE 72 64*  BUN <5* 6   CREATININE 2.53* 2.71*  CALCIUM 8.7* 9.2    Filed Vitals:   03/30/15 0643 03/30/15 0749 03/30/15 0752 03/30/15 1146  BP: 91/58 107/64  98/61  Pulse:  108  119  Temp: 97.5 F (36.4 C)  98.3 F (36.8 C) 98.9 F (37.2 C)  TempSrc: Oral  Oral Oral  Resp:  22  34  Height:      Weight:      SpO2:  100%  100%   Exam Drowsy, withdrawn, tired, weak voice, no distress No rash, cyanosis or gangrene Sclera anicteric, throat clear No jvd Chest slight rales R base o/w clear RRR 2/6 sem no RG Abd obese nontender/ nondist +bs no mass or hsm LE's 1-2+ thigh edema, L thigh AVG +Bruit No sig RLE edema, no UE edema Neuro marked gen weakness, Ox 3  CXR no edema or infiltrates Na 137 K 2.4 Creat 2.71  CO2 29  Alb 1.9  lft's ok WBC 3.4  Hb 6.7   plt 36k  TTS NGKC   4h  77kg  2/2 bath  L thigh AVG  Heparin none Darbe 150 ug/ wk Venofer 100 mg x 8 thru 1/7 Hb 8.6/ tfs 34% / ferr 1830 Ca 9.3 / P 3.6 / iPTH 143  Assessment: 1 Diffuse rash - per primary 2 Thrombocytopenia recurrent, treated w IVIG/ thromboplastin last admission 3 ESRD L thigh AVG 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

## 2015-03-30 NOTE — Progress Notes (Signed)
I/O cath done with return of brown and then cream colored urine with odor. Sent specimens to lab.

## 2015-03-30 NOTE — Progress Notes (Signed)
Subjective:  Patient seen and examined at bedside. No acute events overnight. She was slow to respond this morning, but had no complaints. She pointed to the rash which we examined.    Objective: Vital signs in last 24 hours: Filed Vitals:   03/30/15 0600 03/30/15 0643 03/30/15 0749 03/30/15 0752  BP: 92/57 91/58 107/64   Pulse: 106  108   Temp:  97.5 F (36.4 C)  98.3 F (36.8 C)  TempSrc:  Oral  Oral  Resp: 20  22   Height:      Weight:      SpO2: 100%  100%    Weight change:   Intake/Output Summary (Last 24 hours) at 03/30/15 1124 Last data filed at 03/30/15 0800  Gross per 24 hour  Intake 1311.67 ml  Output      0 ml  Net 1311.67 ml   Gen: Intermittently somnolent, oriented to person, place, and time, no acute distress Neck: No cervical LAD, no thyromegaly or nodules, no JVD noted. CV: RRR There is a 3/6 systolic murmur heard best at LSB that is non-radiating. Pulmonary: Decreased effort, CTA bilaterally, no wheezing, rales, or rhonchi Abdominal: obese, non-tender, non-distended, without rebound, guarding, or masses Extremities: Warm and well perfused, 2+ edema of the left thigh around graft site, 1+ distal LLE edema. Bandages around L femoral graft n  Neuro: CN II-XII intact grossly, no focal neurologic deficits Skin: maculopapular rash, ecchymoses, and scaling present, also non-blanching lesions.  No atypical appearing moles.  Lab Results: reviewed  Micro Results: Recent Results (from the past 240 hour(s))  Blood Culture (routine x 2)     Status: None (Preliminary result)   Collection Time: 03/29/15  8:34 PM  Result Value Ref Range Status   Specimen Description BLOOD RIGHT HAND  Final   Special Requests BOTTLES DRAWN AEROBIC ONLY 5CC  Final   Culture NO GROWTH < 12 HOURS  Final   Report Status PENDING  Incomplete  MRSA PCR Screening     Status: None   Collection Time: 03/29/15 11:51 PM  Result Value Ref Range Status   MRSA by PCR NEGATIVE NEGATIVE  Final    Comment:        The GeneXpert MRSA Assay (FDA approved for NASAL specimens only), is one component of a comprehensive MRSA colonization surveillance program. It is not intended to diagnose MRSA infection nor to guide or monitor treatment for MRSA infections.    Studies/Results: Ct Head Wo Contrast  03/29/2015  CLINICAL DATA:  Initial encounter for episodes of somnolence EXAM: CT HEAD WITHOUT CONTRAST TECHNIQUE: Contiguous axial images were obtained from the base of the skull through the vertex without intravenous contrast. COMPARISON:  02/22/2015. FINDINGS: Area of hypo attenuation in the posterior right frontal lobe is stable since the prior study and was characterized on previous MRI as infarct. No evidence for new acute infarct on today's study. No evidence for acute hemorrhage, hydrocephalus, or mass lesion. No midline shift. No abnormal extra-axial fluid collection. The visualized paranasal sinuses and mastoid air cells are clear. IMPRESSION: Stable appearance of posterior right frontal encephalomalacia, in an area of known infarct. No new or progressive findings on today's exam. Electronically Signed   By: Misty Stanley M.D.   On: 03/29/2015 21:33   Dg Chest Portable 1 View  03/29/2015  CLINICAL DATA:  Subsequent encounter for lupus with concern for sepsis of unknown source. EXAM: PORTABLE CHEST 1 VIEW COMPARISON:  03/17/2015. FINDINGS: 1838 hours. The cardio pericardial silhouette is  enlarged. Bibasilar atelectasis/ pneumonia persists with some improvement in aeration at the left base. No evidence for overt airspace pulmonary edema. Imaged bony structures of the thorax are intact. Telemetry leads overlie the chest. IMPRESSION: Cardiomegaly with left greater than right basilar atelectasis or pneumonia. Aeration at the left base is improved in the interval. Electronically Signed   By: Misty Stanley M.D.   On: 03/29/2015 18:47   Medications: I have reviewed the patient's current  medications. Scheduled Meds: . sodium chloride   Intravenous Once  . antiseptic oral rinse  7 mL Mouth Rinse BID  . atorvastatin  20 mg Oral q1800  . DULoxetine  40 mg Oral Daily  . hydroxychloroquine  400 mg Oral Daily  . midodrine  5 mg Oral BID WC  . mineral oil-hydrophilic petrolatum   Topical BID  . multivitamin  1 tablet Oral QHS  . 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  Acute Encephalopathy:possibly medication induced- we will hold the olanazapine and see how she does. Also the hemoglobin drop may be contributing to her weakness for which we will transfuse today.  -ordered blood cultures and urine cultures -hold olanazapine -UA with micro- I&O cath as the pt makes minimal urine]  Maculopapular Rash: Her rash has been present for the last 3 days, and it is not painful, but is diffusely located on the abdomen, thighs, legs, upper arm and feet. The rash is maculopapular.  She seems to have coumadin induced confluent ecchymoses and also contact dermatitis like features on the upper part, and also the plantar feet.  She had just finished her certaz and finished vanc . Holding antibiotics right now.  Do not see any s/s of infections. Do not think this may be a lupus flare. We do not have dermatology to do skin biopsy but she can have that done when she is discharged where it is available.  -aquaphor  cream  -holding antibiotics  Lupus nephritis, ESRD on TTSa HD through L thigh AVG  -dialysis today  -Midodrine during HD -Continue plaquenil  HgB drop: 10.8-->--> 6.7 this AM. She has had progressive fatigue. INR was 5.7 FOBT +. Plts are stable. Possibly lower GI source   -transfuse 1 unit PRBC with the dialysis today -trend CBCs, PTINR  -holding coumadin  -transfuse if < 7  Hypokalemia: K of 2.4 -repleted accordingly   Major depression with psychotic features - no recurrent hallucinations or delusions since previous admission -holding olanzapine, c/w cymbalta  Deconditioning: -pt eval and treat   PPX - DVT: SCDs; coumadin held   Dispo: Disposition is deferred at this time, awaiting improvement of current medical problems.  Anticipated discharge in approximately 2 day(s).   The patient does have a current PCP Lin Landsman, MD) and does need an [facility] hospital follow-up appointment after discharge.  The patient does not have transportation limitations that hinder transportation to clinic appointments.  .Services Needed at time  of discharge: Y = Yes, Blank = No PT:   OT:   RN:   Equipment:   Other:     LOS: 1 day   Burgess Estelle, MD 03/30/2015, 11:24 AM

## 2015-03-31 LAB — RENAL FUNCTION PANEL
ANION GAP: 12 (ref 5–15)
Albumin: 2 g/dL — ABNORMAL LOW (ref 3.5–5.0)
BUN: 6 mg/dL (ref 6–20)
CALCIUM: 9.1 mg/dL (ref 8.9–10.3)
CHLORIDE: 98 mmol/L — AB (ref 101–111)
CO2: 27 mmol/L (ref 22–32)
Creatinine, Ser: 1.81 mg/dL — ABNORMAL HIGH (ref 0.44–1.00)
GFR calc non Af Amer: 34 mL/min — ABNORMAL LOW (ref >60)
GFR, EST AFRICAN AMERICAN: 40 mL/min — AB (ref >60)
GLUCOSE: 80 mg/dL (ref 65–99)
POTASSIUM: 3.9 mmol/L (ref 3.5–5.1)
Phosphorus: 1.2 mg/dL — ABNORMAL LOW (ref 2.5–4.6)
SODIUM: 137 mmol/L (ref 135–145)

## 2015-03-31 LAB — PREPARE FRESH FROZEN PLASMA
UNIT DIVISION: 0
UNIT DIVISION: 0
Unit division: 0
Unit division: 0

## 2015-03-31 LAB — CBC
HEMATOCRIT: 34.7 % — AB (ref 36.0–46.0)
HEMOGLOBIN: 11.2 g/dL — AB (ref 12.0–15.0)
MCH: 29.8 pg (ref 26.0–34.0)
MCHC: 32.3 g/dL (ref 30.0–36.0)
MCV: 92.3 fL (ref 78.0–100.0)
Platelets: 41 10*3/uL — ABNORMAL LOW (ref 150–400)
RBC: 3.76 MIL/uL — AB (ref 3.87–5.11)
RDW: 19.1 % — ABNORMAL HIGH (ref 11.5–15.5)
WBC: 6 10*3/uL (ref 4.0–10.5)

## 2015-03-31 LAB — TYPE AND SCREEN
ABO/RH(D): A POS
ANTIBODY SCREEN: NEGATIVE
Unit division: 0
Unit division: 0

## 2015-03-31 LAB — PROTIME-INR
INR: 2.45 — AB (ref 0.00–1.49)
Prothrombin Time: 26.3 seconds — ABNORMAL HIGH (ref 11.6–15.2)

## 2015-03-31 LAB — URINE CULTURE

## 2015-03-31 MED ORDER — WARFARIN - PHARMACIST DOSING INPATIENT: Freq: Every day | Status: DC

## 2015-03-31 MED ORDER — TERBINAFINE HCL 1 % EX CREA
TOPICAL_CREAM | Freq: Two times a day (BID) | CUTANEOUS | Status: DC
  Administered 2015-04-01 (×2): via TOPICAL
  Administered 2015-04-01: 1 via TOPICAL
  Administered 2015-04-02 – 2015-04-03 (×3): via TOPICAL
  Filled 2015-03-31 (×2): qty 12

## 2015-03-31 MED ORDER — WARFARIN SODIUM 1 MG PO TABS
1.0000 mg | ORAL_TABLET | Freq: Once | ORAL | Status: AC
  Administered 2015-03-31: 1 mg via ORAL
  Filled 2015-03-31: qty 1

## 2015-03-31 MED ORDER — OLANZAPINE 10 MG PO TBDP
10.0000 mg | ORAL_TABLET | Freq: Two times a day (BID) | ORAL | Status: DC
  Administered 2015-03-31 – 2015-04-03 (×7): 10 mg via ORAL
  Filled 2015-03-31 (×10): qty 1

## 2015-03-31 MED ORDER — WARFARIN SODIUM 2 MG PO TABS: 2.0000 mg | ORAL_TABLET | Freq: Every day | ORAL | Status: DC

## 2015-03-31 MED ORDER — DARBEPOETIN ALFA 150 MCG/0.3ML IJ SOSY: 150.0000 ug | PREFILLED_SYRINGE | INTRAMUSCULAR | Status: DC

## 2015-03-31 MED ORDER — DICLOFENAC SODIUM 1 % TD GEL
2.0000 g | Freq: Once | TRANSDERMAL | Status: DC | PRN
  Filled 2015-03-31: qty 100

## 2015-03-31 MED ORDER — ACETAMINOPHEN 325 MG PO TABS
650.0000 mg | ORAL_TABLET | Freq: Four times a day (QID) | ORAL | Status: AC | PRN
  Administered 2015-03-31 – 2015-04-01 (×3): 650 mg via ORAL
  Filled 2015-03-31 (×4): qty 2

## 2015-03-31 NOTE — Progress Notes (Signed)
[facility] Progress Note   Subjective: HD last night, stable today  Filed Vitals:   03/31/15 0800 03/31/15 0810 03/31/15 0900 03/31/15 1000  BP:  91/63 105/60 100/65  Pulse: 121 119 116 121  Temp:  98.4 F (36.9 C)    TempSrc:  Oral    Resp: 32 27 17 30   Height:      Weight:      SpO2: 100% 100% 100% 100%    Inpatient medications: . sodium chloride   Intravenous Once  . sodium chloride   Intravenous Once  . antiseptic oral rinse  7 mL Mouth Rinse BID  . atorvastatin  20 mg Oral q1800  . DULoxetine  40 mg Oral Daily  . hydroxychloroquine  400 mg Oral Daily  . midodrine  5 mg Oral BID WC  . mineral oil-hydrophilic petrolatum   Topical BID  . multivitamin  1 tablet Oral QHS  . OLANZapine zydis  10 mg Oral BID PC  . pantoprazole  20 mg Oral Daily  . terbinafine   Topical BID  . warfarin  1 mg Oral ONCE-1800  . Warfarin - Pharmacist Dosing Inpatient   Does not apply q1800     acetaminophen, diclofenac sodium, polyethylene glycol  Exam: Drowsy, tired and frail, responsive No jvd Chest slight basilar crackles RRR 2/6 sem no RG Abd obese nontender/ nondist +bs no mass or hsm LE's chron 1+ L thigh edema, L thigh AVG +Bruit No other edema Neuro gen weakness, Ox 3  CXR no edema or infiltrates  TTS [facility] 4h 77kg 2/2 bath L thigh AVG Heparin none Darbe 150 ug/ wk Venofer 100 mg x 8 thru 1/7 Hb 8.6/ tfs 34% / ferr 1830 Ca 9.3 / P 3.6 / iPTH 143  Assessment: 1 Rash - per primary, aymptomatic 2 Thrombocytopenia recurr 3 ESRD L thigh AVG 4 Vol 1 kg over dry  5 Recurr infect- has had Pseudo cath sepsis then acinetobacter urine, then +staph blood, then klebs/VRE urine 6 PNA last admit just completed IV abx for this 7 Marantic endocarditis/ CVA on coumadin 8 Hx CVA L hemi  9 Hypotens on midodrine 10 SVT on MTP 11 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, no vit D 12Anemia cont esa , sp prbc x 2 13 SLE 14 psychosis stable on meds 15 debil multifact  Plan - no new  suggestions, HD tues if still here   Kelly Splinter MD [facility] pager [phone]    cell [phone] 03/31/2015, 12:12 PM    Recent Labs Lab 03/29/15 1920 03/30/15 0524 03/31/15 1040  NA 134* 137 137  K 3.3* 2.4* 3.9  CL 95* 96* 98*  CO2 28 29 27   GLUCOSE 72 64* 80  BUN <5* 6 6  CREATININE 2.53* 2.71* 1.81*  CALCIUM 8.7* 9.2 9.1  PHOS  --   --  1.2*    Recent Labs Lab 03/29/15 1920 03/30/15 0524 03/31/15 1040  AST 49* 27  --   ALT 21 20  --   ALKPHOS 97 89  --   BILITOT 0.8 0.9  --   PROT 5.6* 5.7*  --   ALBUMIN 1.6* 1.9* 2.0*    Recent Labs Lab 03/29/15 1920 03/30/15 0524 03/30/15 1710 03/31/15 1040  WBC 8.6 3.4* 4.2 6.0  NEUTROABS 7.4  --   --   --   HGB 7.7* 6.7* 7.5* 11.2*  HCT 23.2* 21.3* 25.4* 34.7*  MCV 95.9 96.4 95.5 92.3  PLT 112* 36* 45* 41*

## 2015-03-31 NOTE — Progress Notes (Signed)
ANTICOAGULATION CONSULT NOTE - Initial Consult  Pharmacy Consult for warfarin Indication: hx CVA/endocarditis  Allergies  Allergen Reactions  . Food Swelling    Red peppers    Patient Measurements: Height: 5\' 3"  (160 cm) Weight: 172 lb 6.4 oz (78.2 kg) IBW/kg (Calculated) : 52.4   Vital Signs: Temp: 98.4 F (36.9 C) (01/01 0810) Temp Source: Oral (01/01 0810) BP: 91/63 mmHg (01/01 0810) Pulse Rate: 119 (01/01 0810)  Labs:  Recent Labs  03/29/15 1920 03/30/15 0524 03/30/15 1710  HGB 7.7* 6.7* 7.5*  HCT 23.2* 21.3* 25.4*  PLT 112* 36* 45*  APTT 61*  --   --   LABPROT 49.3* 24.7*  --   INR 5.67* 2.25*  --   CREATININE 2.53* 2.71*  --     Estimated Creatinine Clearance: 27.6 mL/min (by C-G formula based on Cr of 2.71).   Medical History: Past Medical History  Diagnosis Date  . Hypertension   . Cardiac arrhythmia   . SVT (supraventricular tachycardia) ([place])     "today, last week, 2 wk ago; maybe 1 month ago, etc; started w/in last 3-4 yrs"(07/20/2012)  . Fainting     "~ 1 month ago; probably related to SVT" (07/20/2012)  . Shortness of breath     "related to SVT episodes" (07/20/2012)  . Chest pain at rest     "related to SVT" (07/20/2012)  . Lupus (systemic lupus erythematosus) ([place])   . GERD (gastroesophageal reflux disease)   . Fibromyalgia   . Irritable bowel syndrome (IBS)   . Hypercholesterolemia   . Heart murmur     "small" (12/25/2014)  . TIA (transient ischemic attack) 12/21/2014  . Sleep apnea     "don't wear mask anymore" (12/25/2014)  . Anemia   . History of blood transfusion "several"    "related to low counts"  . Daily headache   . Arthritis     "hips" (12/25/2014)  . Anxiety     "sometimes; don't take anything for it" (12/25/2014)  . 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  Repeat biopsy early 2016 membranous w/o activity so meds weaned off.  Big flare Aug '16 creat 6, 3rd biopsy DPGN, meds resumed. Ended up starting HD Sept 2016  . Stroke ([place])   . History of vascular access device     2016: Sept 9  R IJ cath per IR.  Sept 20 not candidate for fistula due to disease veins, had LUA hybrid graft placed. Sept 21 - steal syndrome, LUA AVG ligated. Oct 7 - new left femoral Gore-tex loop graft per VVS. 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   INR was elevated at 5.6 on admit, dropped to 2 after FFP and now is trending back up to 2.4 this am with last dose of warfarin on 12/29.   Will order low dose of 1mg  of coumadin tonight given rise in INR and no dose in the past few days.  PTA warfarin dose was 2.5mg  daily  Goal of Therapy:  INR 2-3 Monitor platelets by anticoagulation protocol: Yes   Plan:  Warfarin 1mg  tonight Daily INR for now  Erin Hearing PharmD., BCPS Clinical Pharmacist Pager [phone] 03/31/2015 10:05 AM

## 2015-03-31 NOTE — Evaluation (Signed)
Physical Therapy Evaluation Patient Details Name: [name] MRN: [id number] DOB: 1976/01/16 Today's Date: 03/31/2015   History of Present Illness  pt presents with Coumadin Toxicity and has had multiple recent admits.  pt has PMH significant for Lupus, CVA, ESRD, Fibromyalgia, and Anemia.    Clinical Impression  Pt globally weak and debilitated.  Pt expresses interest in D/C to home, but when I ask about if her family is able to provide the A she needs, she says "probably not".  Feel pt would benefit from continued SNF level of care and therapies.  Feel pt would benefit from Air Overlay and Prevalon Boots due to poor skin integrity.  RN aware.  Will continue to follow.      Follow Up Recommendations SNF    Equipment Recommendations  None recommended by PT    Recommendations for Other Services       Precautions / Restrictions Precautions Precautions: Fall;Other (comment) (Poor Skin Integrity) Restrictions Weight Bearing Restrictions: No      Mobility  Bed Mobility Overal bed mobility: Needs Assistance;+2 for physical assistance Bed Mobility: Rolling Rolling: Max assist;+2 for physical assistance         General bed mobility comments: pt does attempt to use bed rails to A with rolling, however requires 2 person A and use of pad under hips to complete.    Transfers                    Ambulation/Gait                Stairs            Wheelchair Mobility    Modified Rankin (Stroke Patients Only)       Balance                                             Pertinent Vitals/Pain Pain Assessment: Faces Faces Pain Scale: Hurts even more Pain Location: Sacral area during rolling. Pain Descriptors / Indicators: Grimacing Pain Intervention(s): Monitored during session;Repositioned;Premedicated before session    Home Living Family/patient expects to be discharged to:: Skilled nursing facility                       Prior Function Level of Independence: Needs assistance   Gait / Transfers Assistance Needed: pt currently need Lift for OOB to chair and only able to tolerate sitting short periods of time due to sacral pressure sores.  Per chart had been ambulatory when D/C from CIR in November.    ADL's / Homemaking Assistance Needed: pt currently requires A for all ADLs, but after CIR in November pt was performing ADLs with minA.          Hand Dominance        Extremity/Trunk Assessment   Upper Extremity Assessment: Defer to OT evaluation           Lower Extremity Assessment: RLE deficits/detail;LLE deficits/detail RLE Deficits / Details: Globally weak with limited ROM.   LLE Deficits / Details: Globally weak with limited ROM.       Communication   Communication: No difficulties  Cognition Arousal/Alertness: Awake/alert Behavior During Therapy: Flat affect Overall Cognitive Status: Within Functional Limits for tasks assessed                      General Comments  Exercises        Assessment/Plan    PT Assessment Patient needs continued PT services  PT Diagnosis Difficulty walking;Generalized weakness   PT Problem List Decreased strength;Decreased range of motion;Decreased activity tolerance;Decreased balance;Decreased mobility;Decreased coordination;Decreased knowledge of use of DME;Decreased skin integrity  PT Treatment Interventions DME instruction;Gait training;Functional mobility training;Therapeutic activities;Therapeutic exercise;Balance training;Neuromuscular re-education;Patient/family education   PT Goals (Current goals can be found in the Care Plan section) Acute Rehab PT Goals Patient Stated Goal: Be able to walk PT Goal Formulation: With patient Time For Goal Achievement: 04/14/15 Potential to Achieve Goals: Fair    Frequency Min 2X/week   Barriers to discharge        Co-evaluation               End of Session Equipment Utilized During  Treatment: Oxygen Activity Tolerance: Patient limited by fatigue;Patient limited by pain Patient left: in bed;with call bell/phone within reach Nurse Communication: Mobility status;Need for lift equipment         Time: [phone] PT Time Calculation (min) (ACUTE ONLY): 26 min   Charges:   PT Evaluation $Initial PT Evaluation Tier I: 1 Procedure PT Treatments $Therapeutic Activity: 8-22 mins   PT G CodesCatarina Hartshorn, [place] [id number] 03/31/2015, 12:20 PM

## 2015-03-31 NOTE — Progress Notes (Signed)
Subjective:  Patient seen and examined at bedside. Her mental status was much improved- she was much more alert this morning.  She denies any f/c/n/v/d/c.  She says she makes very little urine, about once per week. She denies any dysuria or other urinary complaints. She says the rash is not painful, but has pain in her legs and this happens after she gets the dialysis tx.      Objective: Vital signs in last 24 hours: Filed Vitals:   03/30/15 2359 03/31/15 0400 03/31/15 0433 03/31/15 0810  BP: 99/51 85/52  91/63  Pulse: 122 121  119  Temp: 97.9 F (36.6 C)  97.5 F (36.4 C) 98.4 F (36.9 C)  TempSrc: Oral  Oral Oral  Resp:  22  27  Height:      Weight:      SpO2: 100% 100%  100%   Weight change: 3.5 oz (0.1 kg)  Intake/Output Summary (Last 24 hours) at 03/31/15 [id number] Last data filed at 03/31/15 0000  Gross per 24 hour  Intake    760 ml  Output   2150 ml  Net  -1390 ml   Gen: alert and oriented to person, place, and time, no acute distress, more alert today as compared to yesterday CV: RRR There is a 3/6 systolic murmur heard best at LSB that is non-radiating. Pulmonary: Decreased effort, CTA bilaterally, no wheezing, rales, or rhonchi Abdominal: obese, non-tender, non-distended, without rebound, guarding, or masses Extremities: Warm and well perfused, 2+ edema of the left thigh around graft site, 1+ distal LLE edema. Bandages around L femoral graft n  Neuro: CN II-XII intact grossly, no focal neurologic deficits Skin: maculopapular rash, ecchymoses, and scaling present, also non-blanching lesions.     Lab Results: reviewed  Micro Results: Recent Results (from the past 240 hour(s))  Blood Culture (routine x 2)     Status: None (Preliminary result)   Collection Time: 03/29/15  8:34 PM  Result Value Ref Range Status   Specimen Description BLOOD RIGHT HAND  Final   Special Requests BOTTLES DRAWN AEROBIC ONLY 5CC  Final   Culture NO GROWTH < 12 HOURS  Final   Report Status PENDING  Incomplete  MRSA PCR Screening     Status: None   Collection Time: 03/29/15 11:51 PM  Result Value Ref Range Status   MRSA by PCR NEGATIVE NEGATIVE Final    Comment:        The GeneXpert MRSA Assay (FDA approved for NASAL specimens only), is one component of a comprehensive MRSA colonization surveillance program. It is not intended to diagnose MRSA infection nor to guide or monitor treatment for MRSA infections.    Studies/Results: Ct Head Wo Contrast  03/29/2015  CLINICAL DATA:  Initial encounter for episodes of somnolence EXAM: CT HEAD WITHOUT CONTRAST TECHNIQUE: Contiguous axial images were obtained from the base of the skull through the vertex without intravenous contrast. COMPARISON:  02/22/2015. FINDINGS: Area of hypo attenuation in the posterior right frontal lobe is stable since the prior study and was characterized on previous MRI as infarct. No evidence for new acute infarct on today's study. No evidence for acute hemorrhage, hydrocephalus, or mass lesion. No midline shift. No abnormal extra-axial fluid collection. The visualized paranasal sinuses and mastoid air cells are clear. IMPRESSION: Stable appearance of posterior right frontal encephalomalacia, in an area of known infarct. No new or progressive findings on today's exam. Electronically Signed   By: Misty Stanley M.D.   On: 03/29/2015 21:33  Dg Chest Portable 1 View  03/29/2015  CLINICAL DATA:  Subsequent encounter for lupus with concern for sepsis of unknown source. EXAM: PORTABLE CHEST 1 VIEW COMPARISON:  03/17/2015. FINDINGS: 1838 hours. The cardio pericardial silhouette is enlarged. Bibasilar atelectasis/ pneumonia persists with some improvement in aeration at the left base. No evidence for overt airspace pulmonary edema. Imaged bony structures of the thorax are intact. Telemetry leads overlie the chest. IMPRESSION: Cardiomegaly with left greater than right basilar atelectasis or pneumonia.  Aeration at the left base is improved in the interval. Electronically Signed   By: Misty Stanley M.D.   On: 03/29/2015 18:47   Medications: I have reviewed the patient's current medications. Scheduled Meds: . sodium chloride   Intravenous Once  . sodium chloride   Intravenous Once  . antiseptic oral rinse  7 mL Mouth Rinse BID  . atorvastatin  20 mg Oral q1800  . DULoxetine  40 mg Oral Daily  . hydroxychloroquine  400 mg Oral Daily  . midodrine  5 mg Oral BID WC  . mineral oil-hydrophilic petrolatum   Topical BID  . multivitamin  1 tablet Oral QHS  . pantoprazole  20 mg Oral Daily  . 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  Acute Encephalopathy:possibly medication induced- we will hold the olanazapine and see how she does. Also the hemoglobin drop may be contributing to her weakness for which we transfused 12/31 Her mental status has improved considerably.  -blood cultures and urine cultures pending -resumed olanazapine -holding linezolid for recurrent UTI due to thrombocytopenia   Maculopapular Rash: Her rash has been present for the last 3 days, and it is not painful, but is diffusely located on the abdomen, thighs, legs, upper arm and feet. The rash is maculopapular.  She seems to have coumadin induced confluent ecchymoses and also contact dermatitis like features on  the upper part, and also the plantar feet.  She had just finished her certaz and finished vanc . Holding antibiotics right now.  Do not see any s/s of infections. Do not think this may be a lupus flare. We do not have dermatology to do skin biopsy but she can have that done when she is discharged where it is available.  -aquaphor cream  -lamisil cream -holding antibiotics -we will resume coumadin at 2 mg daily, instead of 2.5 mg daily    Lupus nephritis, ESRD on TTSa HD through L thigh AVG  -dialysis TTS -Midodrine during HD -Continue plaquenil  H/o recurrent polymicrobial UTIs:  She completed her 14-day course of vancomycin and ceftazidime during HD  12/30. She has had history of recurrent UTIs and the cultures in the past have been resistant to a lot of antibiotics- including vancomycin. It had grown VRE, and also had candida and klebsiella. She had a repeat UA done yesterday which showed large leuks and nitrites and was turbid. The urine culture is pending. The  cultures in the past was sensitive to linezolid. However, given her current thrombocytopenia And given that she is afebrile and not systemically sick, we are holding any antibiotics.   -Hold abx for now  -F/u urine culture  HgB drop: 10.8-->--> 6.7 yesterday AM. She has had progressive fatigue. INR was 5.7 FOBT +. Plts are stable at 45. Possibly lower GI source   -transfused 1 unit PRBC with the dialysis -post transfusion CBC was 7.5 -coumadin 2 mg daily  -transfuse if < 7 -CBC repeat tomorrow    Major depression with psychotic features - no recurrent hallucinations or delusions since previous admission -resume olanzapine, c/w cymbalta _She seems more alert today   Deconditioning: -pt eval and treat   PPX - DVT: SCDs; coumadin held   Dispo: Disposition is deferred at this time, awaiting improvement of current medical problems.  Anticipated discharge in approximately 2 day(s).   The patient does have a current PCP  Lin Landsman, MD) and does need an [facility] hospital follow-up appointment after discharge.  The patient does not have transportation limitations that hinder transportation to clinic appointments.  .Services Needed at time of discharge: Y = Yes, Blank = No PT:   OT:   RN:   Equipment:   Other:     LOS: 2 days   Burgess Estelle, MD 03/31/2015, 9:09 AM

## 2015-04-01 LAB — CBC
HEMATOCRIT: 31.4 % — AB (ref 36.0–46.0)
HEMOGLOBIN: 10.8 g/dL — AB (ref 12.0–15.0)
MCH: 31 pg (ref 26.0–34.0)
MCHC: 34.4 g/dL (ref 30.0–36.0)
MCV: 90.2 fL (ref 78.0–100.0)
Platelets: 63 10*3/uL — ABNORMAL LOW (ref 150–400)
RBC: 3.48 MIL/uL — AB (ref 3.87–5.11)
RDW: 19.2 % — ABNORMAL HIGH (ref 11.5–15.5)
WBC: 11.2 10*3/uL — ABNORMAL HIGH (ref 4.0–10.5)

## 2015-04-01 MED ORDER — WARFARIN SODIUM 1 MG PO TABS
1.0000 mg | ORAL_TABLET | Freq: Once | ORAL | Status: AC
  Administered 2015-04-01: 1 mg via ORAL
  Filled 2015-04-01: qty 1

## 2015-04-01 MED ORDER — FLUCONAZOLE 100 MG PO TABS
150.0000 mg | ORAL_TABLET | Freq: Every day | ORAL | Status: DC
  Administered 2015-04-01 – 2015-04-03 (×3): 150 mg via ORAL
  Filled 2015-04-01 (×3): qty 2

## 2015-04-01 NOTE — Evaluation (Signed)
Clinical/Bedside Swallow Evaluation Patient Details  Name: [name] MRN: [id number] Date of Birth: April 12, 1975  Today's Date: 04/01/2015 Time: SLP Start Time (ACUTE ONLY): 1515 SLP Stop Time (ACUTE ONLY): 1537 SLP Time Calculation (min) (ACUTE ONLY): 22 min  Past Medical History:  Past Medical History  Diagnosis Date  . Hypertension   . Cardiac arrhythmia   . SVT (supraventricular tachycardia) ([place])     "today, last week, 2 wk ago; maybe 1 month ago, etc; started w/in last 3-4 yrs"(07/20/2012)  . Fainting     "~ 1 month ago; probably related to SVT" (07/20/2012)  . Shortness of breath     "related to SVT episodes" (07/20/2012)  . Chest pain at rest     "related to SVT" (07/20/2012)  . Lupus (systemic lupus erythematosus) ([place])   . GERD (gastroesophageal reflux disease)   . Fibromyalgia   . Irritable bowel syndrome (IBS)   . Hypercholesterolemia   . Heart murmur     "small" (12/25/2014)  . TIA (transient ischemic attack) 12/21/2014  . Sleep apnea     "don't wear mask anymore" (12/25/2014)  . Anemia   . History of blood transfusion "several"    "related to low counts"  . Daily headache   . Arthritis     "hips" (12/25/2014)  . Anxiety     "sometimes; don't take anything for it" (12/25/2014)  . ESRD (end stage renal disease) on dialysis ([place]) since 12/07/2014    Diagnosed Aug 2014 w SLE nephritis DPGN by biopsy, rx cellcept/ steroids.  Repeat biopsy early 2016 membranous w/o activity so meds weaned off. Big flare Aug '16 creat 6, 3rd biopsy DPGN, meds resumed. Ended up starting HD Sept 2016  . Stroke ([place])   . History of vascular access device     2016: Sept 9  R IJ cath per IR.  Sept 20 not candidate for fistula due to disease veins, had LUA hybrid graft placed. Sept 21 - steal syndrome, LUA AVG ligated. Oct 7 - new left femoral Gore-tex loop graft per VVS. Oct 29 - removal R IJ tunneled HD cath   Past Surgical History:  Past Surgical History  Procedure Laterality Date  .  Cesarean section  2001; 2010  . Cervical biopsy  2013  . Supraventricular tachycardia ablation  07/21/12     Dr. Cristopher Peru   . Peripheral vascular catheterization N/A 12/14/2014    Procedure: Upper Extremity Venography;  Surgeon: Elam Dutch, MD;  Location: [place] [facility];  Service: Cardiovascular;  Laterality: N/A;  . Insertion hybrid anteriovenous gortex graft Left 12/18/2014    Procedure: INSERTION GORE HYBRID ARTERIOVENOUS GRAFT LEFT AXILLO-BRACHIAL.;  Surgeon: Serafina Mitchell, MD;  Location: [facility] OR;  Service: Vascular;  Laterality: Left;  . Ligation of arteriovenous  fistula Left 12/19/2014    Procedure: LIGATION OF FISTULA;  Surgeon: Elam Dutch, MD;  Location: [place];  Service: Vascular;  Laterality: Left;  . Appendectomy  2011  . Tubal ligation  2010  . Tee without cardioversion N/A 12/25/2014    Procedure: TRANSESOPHAGEAL ECHOCARDIOGRAM (TEE);  Surgeon: Sueanne Margarita, MD;  Location: [facility];  Service: Cardiovascular;  Laterality: N/A;  . Av fistula placement Left 01/04/2015    Procedure: INSERTION OF ARTERIOVENOUS (AV) GORE-TEX GRAFT THIGH;  Surgeon: Rosetta Posner, MD;  Location: [facility];  Service: Vascular;  Laterality: Left;  . Tee without cardioversion N/A 01/30/2015    Procedure: TRANSESOPHAGEAL ECHOCARDIOGRAM (TEE);  Surgeon: Lelon Perla, MD;  Location:  [facility];  Service: Cardiovascular;  Laterality: N/A;   HPI:  pt presents with Coumadin Toxicity and has had multiple recent admits. pt has PMH significant for Lupus, CVA, ESRD, Fibromyalgia, and Anemia. pt presents with Coumadin Toxicity and has had multiple recent admits. pt has PMH significant for Lupus, CVA, ESRD, Fibromyalgia, and Anemia.    Assessment / Plan / Recommendation Clinical Impression  Pt's oropharyngeal swallow appears grossly similar to prior presentation during recent admission, at which time a primarily oral dysphagia was noted. She continues to have difficulty accessing liquid via  straw, but consumes thin liquids by cup without difficulty. She does have oral holding with pills that requires Mod cues for transit, and mastication of solids is labored and prolonged. Pt has c/o food "sticking" to her teeth, but upon examination there is trace to mild residuals. Recommend adjusting diet to Dys 2 textures, continuing thin liquids, to facilitate oral phase. Will f/u for tolerance.    Aspiration Risk  Mild aspiration risk;Moderate aspiration risk    Diet Recommendation  Dys 2 diet, thin liquids Full supervision   Medication Administration: Crushed with puree    Other  Recommendations Oral Care Recommendations: Oral care BID   Follow up Recommendations  Skilled Nursing facility    Frequency and Duration min 2x/week  2 weeks       Prognosis Prognosis for Safe Diet Advancement: Good Barriers to Reach Goals: Cognitive deficits      Swallow Study   General HPI: pt presents with Coumadin Toxicity and has had multiple recent admits. pt has PMH significant for Lupus, CVA, ESRD, Fibromyalgia, and Anemia. pt presents with Coumadin Toxicity and has had multiple recent admits. pt has PMH significant for Lupus, CVA, ESRD, Fibromyalgia, and Anemia.  Type of Study: Bedside Swallow Evaluation Previous Swallow Assessment: BSE 01/2015 recommending Dys 1 and thin liquids, was further advanced to regular diet prior to d/c Diet Prior to this Study: Regular;Thin liquids Temperature Spikes Noted: No Respiratory Status: Room air History of Recent Intubation: No Behavior/Cognition: Alert;Cooperative;Requires cueing Oral Cavity Assessment: Within Functional Limits Oral Care Completed by SLP: No Oral Cavity - Dentition: Adequate natural dentition Vision: Functional for self-feeding Self-Feeding Abilities: Able to feed self;Needs assist Patient Positioning: Upright in bed Baseline Vocal Quality: Low vocal intensity    Oral/Motor/Sensory Function Overall Oral Motor/Sensory Function:   (appears WFL)   Ice Chips Ice chips: Not tested   Thin Liquid Thin Liquid: Impaired Presentation: Cup;Self Fed (unable to obtain liquid via straw) Oral Phase Impairments: Poor awareness of bolus Other Comments: pt took cup sips automatically, could not initiate straw sip    Nectar Thick Nectar Thick Liquid: Not tested   Honey Thick Honey Thick Liquid: Not tested   Puree Puree: Within functional limits Presentation: Self Fed;Spoon   Solid   GO    Solid: Impaired Presentation: Self Fed Oral Phase Impairments: Impaired mastication;Reduced lingual movement/coordination Oral Phase Functional Implications: Oral holding;Impaired mastication;Prolonged oral transit      Germain Osgood, M.A. CCC-SLP [phone]  Germain Osgood 04/01/2015,3:54 PM

## 2015-04-01 NOTE — Care Management Note (Signed)
Case Management Note  Patient Details  Name: [name] MRN: [id number] Date of Birth: Aug 15, 1975  Subjective/Objective:       CM following for progression and d/c planning.             Action/Plan: 04/01/2015 Received call from resident, Dr Bearl Mulberry, who stated that this pt needs LTAC care due to ongoing skin care issues. This CM noted that pt has Medicaid as payor source, and informed Dr . Tiburcio Pea that LTACs do not accept Medicaid, and that this pt is currently a SNF resident at a facility with a wound specialist and [place] nurses. Additionally the pt's Medicaid status limits the number of facilities that will accept this pt. Her current facility is one of the few that can provide the wound care that she requires at this time. This was discussed with CSW Kendell Bane who also agreed that pt current SNF has a wound program and that the pt options for care are limited by her Medicaid,she will contact pt currently SNF representative and discuss pt needs and their ability to manage these needs. Dr. Tiburcio Pea  also stated that this pt has been a pt at [facility] in the past, however, when this CM contacted [facility] and they confirmed that they do not accept pt with Medicaid coverage and have no record of this pt being adm to their facility. Perhaps this pt has confused her time at [facility], with a LTAC experience.  As a case Freight forwarder, I explained that if this pt is ready for d/c from the acute hospital that she has a safe d/c plan in returning to her current SNF and that the pt or her family may continue a search for another  facility after her d/c. The pt will not meet inpt critieria if she is ready for d/c ,but awaiting another facility.  All of this was discussed with the CSW Jerrye Bushy ,as locating SNF is the role of the CSW and not within the scope for the current CM role.  The justification of and the need for ongoing inpt acute care is the role of the case manager and is of concern at this time.   Expected  Discharge Date:                  Expected Discharge Plan:  Skilled Nursing Facility  In-House Referral:  Clinical Social Work  Discharge planning Services  CM Consult  Post Acute Care Choice:  NA Choice offered to:  NA  DME Arranged:    DME Agency:     HH Arranged:    [place] Agency:     Status of Service:     Medicare Important Message Given:    Date Medicare IM Given:    Medicare IM give by:    Date Additional Medicare IM Given:    Additional Medicare Important Message give by:     If discussed at [place] of Stay Meetings, dates discussed:    Additional Comments:  Adron Bene, RN 04/01/2015, 11:28 AM

## 2015-04-01 NOTE — Progress Notes (Signed)
Subjective:  Patient seen and examined at bedside. Her mental status was improved- she was  alert this morning.  Yesterday, upon bedside exam, she was found to have a stage 2-3 sacral ulcer with significant skin breakdown. This has worsened from before. Wound care has been consulted and she is  She denies any f/c/n/v/d/c.   I spoke with the case manager who is unwilling to work on finding a different SNF facility because of the patient's insurance status. I have spoken to [place] yesterday also, and till today no different facility has been found. Patient is getting very poor care at the SNF facility she came from so we can not send her back there. Given her complicated medical history and extensive needs, we think she may benefit from a long term care facility like SNF. With her current insurance, she has been to [facility] before. If that is not possible, then a different snf facility would be beneficial.   Objective: Vital signs in last 24 hours: Filed Vitals:   03/31/15 2240 03/31/15 2242 04/01/15 0100 04/01/15 0557  BP: 78/36 78/36 72/38  86/39  Pulse:   125 128  Temp:   98.6 F (37 C) 98.4 F (36.9 C)  TempSrc:   Oral Oral  Resp:   16 18  Height:      Weight:      SpO2:   100% 100%   Weight change:   Intake/Output Summary (Last 24 hours) at 04/01/15 0947 Last data filed at 04/01/15 0600  Gross per 24 hour  Intake    720 ml  Output      0 ml  Net    720 ml   Gen: alert and oriented to person, place, and time, no acute distress, more alert today as compared to yesterday CV: unlabored respirations  Abdominal: obese, non-tender, non-distended, without rebound, guarding, or masses Extremities: Warm and well perfused, 2+ edema of the left thigh around graft site, 1+ distal LLE edema. Bandages around L femoral graft  Neuro: CN II-XII intact grossly, no focal neurologic deficits Skin: maculopapular rash, ecchymoses, and scaling present, also non-blanching lesions.     Lab Results:  reviewed  Micro Results: Recent Results (from the past 240 hour(s))  Blood Culture (routine x 2)     Status: None (Preliminary result)   Collection Time: 03/29/15  8:34 PM  Result Value Ref Range Status   Specimen Description BLOOD RIGHT HAND  Final   Special Requests BOTTLES DRAWN AEROBIC ONLY 5CC  Final   Culture NO GROWTH 2 DAYS  Final   Report Status PENDING  Incomplete  Blood Culture (routine x 2)     Status: None (Preliminary result)   Collection Time: 03/29/15  8:54 PM  Result Value Ref Range Status   Specimen Description BLOOD RIGHT HAND  Final   Special Requests BOTTLES DRAWN AEROBIC ONLY 5CC  Final   Culture NO GROWTH 1 DAY  Final   Report Status PENDING  Incomplete  MRSA PCR Screening     Status: None   Collection Time: 03/29/15 11:51 PM  Result Value Ref Range Status   MRSA by PCR NEGATIVE NEGATIVE Final    Comment:        The GeneXpert MRSA Assay (FDA approved for NASAL specimens only), is one component of a comprehensive MRSA colonization surveillance program. It is not intended to diagnose MRSA infection nor to guide or monitor treatment for MRSA infections.   Urine culture     Status: None  Collection Time: 03/30/15 11:40 AM  Result Value Ref Range Status   Specimen Description URINE, RANDOM  Final   Special Requests NONE  Final   Culture MULTIPLE SPECIES PRESENT, SUGGEST RECOLLECTION  Final   Report Status 03/31/2015 FINAL  Final   Studies/Results: No results found. Medications: I have reviewed the patient's current medications. Scheduled Meds: . sodium chloride   Intravenous Once  . sodium chloride   Intravenous Once  . antiseptic oral rinse  7 mL Mouth Rinse BID  . atorvastatin  20 mg Oral q1800  . [START ON 04/04/2015] darbepoetin (ARANESP) injection - DIALYSIS  150 mcg Intravenous Q Thu-HD  . DULoxetine  40 mg Oral Daily  . hydroxychloroquine  400 mg Oral Daily  . midodrine  5 mg Oral BID WC  . mineral oil-hydrophilic petrolatum   Topical BID    . multivitamin  1 tablet Oral QHS  . OLANZapine zydis  10 mg Oral BID PC  . pantoprazole  20 mg Oral Daily  . terbinafine   Topical BID  . 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  Maculopapular Rash: Her rash has been present for the last 3 days, and it is not painful, but is diffusely located on the abdomen, thighs, legs, upper arm and feet. The rash is maculopapular.  She seems to have coumadin induced confluent ecchymoses and also contact dermatitis like features on the upper part, and also the plantar feet.  She had just finished her certaz and finished vanc . Holding antibiotics right now.  Do not see any s/s of infections. Do not think this may be a lupus flare. We do not have dermatology to do skin biopsy but she can have that done when she is discharged where it is available.  -aquaphor cream  -lamisil cream -holding antibiotics -we will resume coumadin at 2 mg daily, instead of 2.5 mg daily    Sacral wound: ulcer which seems to have worsened and now showing full thickness skin loss -consulted wound care who is following -air bed -out of bed for few hours   Lupus nephritis, ESRD on TTSa HD through L thigh AVG  -dialysis TTS -Midodrine during HD -Continue plaquenil  H/o recurrent polymicrobial UTIs:  She completed her 14-day  course of vancomycin and ceftazidime during HD  12/30. She has had history of recurrent UTIs and the cultures in the past have been resistant to a lot of antibiotics- including vancomycin. It had grown VRE, and also had candida and klebsiella. She had a repeat UA done yesterday which showed large leuks and nitrites and was turbid. The urine culture is pending. The cultures in the past was sensitive to linezolid. However, given her current thrombocytopenia And given that she is afebrile and not systemically sick, we are holding any antibiotics.   -Hold abx for now  -urine culture suggest recollection but she has  no urinary symptoms.  HgB drop: 10.8-->--> 6.7 yesterday AM. She has had progressive fatigue. INR was 5.7 FOBT +. Plts are stable at 45. Possibly lower GI source  At the dialysis session on 12/31, transfused  PRBC with the dialysis. Post transfusion CBC was 11.2  -coumadin per pharm -transfuse if < 7 -CBC repeat tomorrow    Major depression with psychotic features - no recurrent hallucinations or delusions since previous admission -resume olanzapine, c/w cymbalta _She seems more alert today   Deconditioning: -pt eval and treat   PPX - DVT: SCDs; coumadin per pharm   Disposition:  I spoke with the case manager who is unwilling to work on finding a different SNF facility because of the patient's insurance status. I have spoken to [place] yesterday also, and till today no different facility has been found. Patient is getting very poor care at the SNF facility she came from so we can not send her back there. Given her complicated medical history and extensive needs, we think she may benefit from a long term care facility like SNF. With her current insurance, [facility]. If that is not possible, then a different snf facility would be beneficial.    Dispo: Disposition is deferred at this time, awaiting improvement of current medical problems.  Anticipated discharge in  approximately 2 day(s).   The patient does have a current PCP Lin Landsman, MD) and does need an [facility] hospital follow-up appointment after discharge.  The patient does not have transportation limitations that hinder transportation to clinic appointments.  .Services Needed at time of discharge: Y = Yes, Blank = No PT:   OT:   RN:   Equipment:   Other:     LOS: 3 days   Burgess Estelle, MD 04/01/2015, 9:47 AM

## 2015-04-01 NOTE — Progress Notes (Signed)
ANTICOAGULATION CONSULT NOTE - Initial Consult  Pharmacy Consult for warfarin Indication: hx CVA from endocarditis  Allergies  Allergen Reactions  . Food Swelling    Red peppers    Patient Measurements: Height: 5\' 3"  (160 cm) Weight: 172 lb 6.4 oz (78.2 kg) IBW/kg (Calculated) : 52.4   Vital Signs: Temp: 98.2 F (36.8 C) (01/02 1021) Temp Source: Oral (01/02 1021) BP: 112/59 mmHg (01/02 1021) Pulse Rate: 129 (01/02 1021)  Labs:  Recent Labs  03/29/15 1920 03/30/15 0524 03/30/15 1710 03/31/15 1040 04/01/15 1130  HGB 7.7* 6.7* 7.5* 11.2* 10.8*  HCT 23.2* 21.3* 25.4* 34.7* 31.4*  PLT 112* 36* 45* 41* 63*  APTT 61*  --   --   --   --   LABPROT 49.3* 24.7*  --  26.3*  --   INR 5.67* 2.25*  --  2.45*  --   CREATININE 2.53* 2.71*  --  1.81*  --     Estimated Creatinine Clearance: 41.3 mL/min (by C-G formula based on Cr of 1.81).   Medical History: Past Medical History  Diagnosis Date  . Hypertension   . Cardiac arrhythmia   . SVT (supraventricular tachycardia) ([place])     "today, last week, 2 wk ago; maybe 1 month ago, etc; started w/in last 3-4 yrs"(07/20/2012)  . Fainting     "~ 1 month ago; probably related to SVT" (07/20/2012)  . Shortness of breath     "related to SVT episodes" (07/20/2012)  . Chest pain at rest     "related to SVT" (07/20/2012)  . Lupus (systemic lupus erythematosus) ([place])   . GERD (gastroesophageal reflux disease)   . Fibromyalgia   . Irritable bowel syndrome (IBS)   . Hypercholesterolemia   . Heart murmur     "small" (12/25/2014)  . TIA (transient ischemic attack) 12/21/2014  . Sleep apnea     "don't wear mask anymore" (12/25/2014)  . Anemia   . History of blood transfusion "several"    "related to low counts"  . Daily headache   . Arthritis     "hips" (12/25/2014)  . Anxiety     "sometimes; don't take anything for it" (12/25/2014)  . ESRD (end stage renal disease) on dialysis ([place]) since 12/07/2014    Diagnosed Aug 2014 w SLE  nephritis DPGN by biopsy, rx cellcept/ steroids.  Repeat biopsy early 2016 membranous w/o activity so meds weaned off. Big flare Aug '16 creat 6, 3rd biopsy DPGN, meds resumed. Ended up starting HD Sept 2016  . Stroke ([place])   . History of vascular access device     2016: Sept 9  R IJ cath per IR.  Sept 20 not candidate for fistula due to disease veins, had LUA hybrid graft placed. Sept 21 - steal syndrome, LUA AVG ligated. Oct 7 - new left femoral Gore-tex loop graft per VVS. Oct 29 - removal R IJ tunneled HD cath   Assessment:  Anticoagulation: Warfarin PTA for CVA from endocarditis, INR 5.6 on admit on 2.5mg  daily. Received FFP and PRBC, Hgb 7.5>11, FOBT+ on admission,  no further mention. Plts only 63 from SLE. Protein C 179 slightly elevated 9/26. No INR done today 1/2. Watch for INR elevation from Fluconazole also.  - MD note: coumadin induced confluent ecchymoses and also contact dermatitis like features on the upper part, and also the plantar feet  Goal of Therapy:  INR 2-3 Monitor platelets by anticoagulation protocol: Yes   Plan:  Repeat Coumadin 1mg  po x 1 today  Order daily INRs   Usman Millett S. Alford Highland, PharmD, [facility] Clinical Staff Pharmacist Pager [phone]  04/01/2015 12:54 PM

## 2015-04-01 NOTE — Progress Notes (Signed)
CSW met with patient and her mother Mrs. Helton this afternoon to discuss concerns about SNF and patient's aftercare.  As noted in RNCM's note- mother mentioned possible return home with home health vs continued SNF placement.  Patient and mother agree that patient would benefit for further SNF placement for rehab.  Discussed possible referral to [facility] as this facility is willing to accept patient. Patient's mother spoke with Gayla Medicus, RN Liaison from [facility] and an open discussion of her mother and patient's concerns were addressed.  Mother indicated that this discussion helped her to understand better some of the issues from the SNF. Patient still prefers not to return to [facility] but is agreeable to [facility]. CSW attempted to discuss with attending Resident but was unable to reach him . CSW will follow up tomorrow with MD to determine if patient is medically stable for d/c.  Lorie Phenix. Pauline Good, [place]

## 2015-04-01 NOTE — Evaluation (Signed)
Occupational Therapy Evaluation Patient Details Name: [name] MRN: [id number] DOB: 08/30/1975 Today's Date: 04/01/2015    History of Present Illness pt presents with Coumadin Toxicity and has had multiple recent admits.  pt has PMH significant for Lupus, CVA, ESRD, Fibromyalgia, and Anemia.     Clinical Impression   PT admitted with see above statment. Pt currently with functional limitiations due to the deficits listed below (see OT problem list). PTA at SNF with (A) for all adls and hoyer lift currently.  Pt will benefit from skilled OT to increase their independence and safety with adls and balance to allow discharge SNF. Pt is not appropriate at this time for CIR and could greatly benefit from higher level care ( SNF). Pt will required air mattress overlay and frequent turns due to skin break down.      Follow Up Recommendations  SNF;Supervision/Assistance - 24 hour    Equipment Recommendations  Wheelchair cushion (measurements OT);Wheelchair (measurements OT);Hospital bed (mattress overlay)    Recommendations for Other Services       Precautions / Restrictions Precautions Precautions: Fall Precaution Comments: high anxiety, risk for skin tear      Mobility Bed Mobility Overal bed mobility: Needs Assistance Bed Mobility: Rolling Rolling: Max assist         General bed mobility comments: pt required pad used to help facilitate a full roll onto side. Pt request to be in side lying so pillows place behind patient  Transfers                 General transfer comment: not attempted due to pain and patient will required total +2 (A) with equipment for safety based on bed level assessment    Balance                                            ADL Overall ADL's : Needs assistance/impaired Eating/Feeding: Set up;Bed level Eating/Feeding Details (indicate cue type and reason): pt able to get cup from side table and drink from cup                                    General ADL Comments: pt will require max (A) for all adls. Pt noted to have skin break down throughout body and risk for skin tears from tactile input. Pt with extreme pain with log rolling in the bed.      Vision Additional Comments: not formally tested. Pt tracking therapist around the room   Perception     Praxis      Pertinent Vitals/Pain Pain Assessment: Faces Faces Pain Scale: Hurts whole lot Pain Location: buttock Pain Descriptors / Indicators: Constant Pain Intervention(s): Repositioned     Hand Dominance Right   Extremity/Trunk Assessment Upper Extremity Assessment Upper Extremity Assessment: Generalized weakness;RUE deficits/detail;LUE deficits/detail RUE Deficits / Details: noted skin break down LUE Deficits / Details: noted skin break down with open wound on L forearm   Lower Extremity Assessment Lower Extremity Assessment: Defer to PT evaluation RLE Deficits / Details: pt asking OT to ROM - pt with tone noted and resistance to knee flexion/ hip flexion       Communication Communication Communication: No difficulties   Cognition Arousal/Alertness: Awake/alert Behavior During Therapy: Flat affect Overall Cognitive Status: Impaired/Different from baseline Area of Impairment: Memory;Awareness  Memory: Decreased short-term memory     Awareness: Intellectual   General Comments: pt repeating herself and asking therapist to repeat questions due to poor recall. Pt reports severe pain limiting mobility but declined RN offer for pain medications   General Comments       Exercises       Shoulder Instructions      Home Living Family/patient expects to be discharged to:: Skilled nursing facility                                        Prior Functioning/Environment Level of Independence: Needs assistance    ADL's / Homemaking Assistance Needed: requires max (A) for adls at snf level          OT Diagnosis: Generalized weakness;Cognitive deficits   OT Problem List: Decreased strength;Decreased activity tolerance;Impaired balance (sitting and/or standing);Decreased safety awareness;Decreased knowledge of use of DME or AE;Decreased knowledge of precautions;Pain;Obesity;Decreased cognition   OT Treatment/Interventions: Self-care/ADL training;Therapeutic exercise;Neuromuscular education;DME and/or AE instruction;Therapeutic activities;Cognitive remediation/compensation;Patient/family education;Balance training    OT Goals(Current goals can be found in the care plan section) Acute Rehab OT Goals Patient Stated Goal: to go to rehab OT Goal Formulation: Patient unable to participate in goal setting Time For Goal Achievement: 04/15/15 Potential to Achieve Goals: Fair  OT Frequency: Min 2X/week   Barriers to D/C:    from SNF and needs to return SNF       Co-evaluation              End of Session Nurse Communication: Mobility status;Precautions  Activity Tolerance: Patient limited by pain Patient left: in bed;with call bell/phone within reach;with bed alarm set   Time: 1115-1131 OT Time Calculation (min): 16 min high tier III Charges:  OT General Charges $OT Visit: 1 Procedure OT Evaluation $Initial OT Evaluation Tier I: 1 Procedure (high) G-Codes:    Peri Maris 2015-04-10, 12:12 PM   Jeri Modena   OTR/L Pager: [phone] Office: [phone] .

## 2015-04-01 NOTE — Progress Notes (Signed)
During the night, the patient's manual SBP have been in the 70's.  Patient is asymptomatic.  MD made aware and will continue to monitor patient.  [employer] RN-BC, WTA.

## 2015-04-01 NOTE — Consult Note (Addendum)
WOC wound consult note Reason for Consult: Consult requested for buttocks and skin folds.  Pt was admitted with rash of unknown etiology and has red patchy areas all over skin.  This may be vasculitis and if skin loss becomes progressively worse, then recommend dermatology consult. Wound type: Bilat buttocks with 80% skin loss, 20% intact loose peeling skin which has not evolved yet at this time.  Exposed areas of full thickness skin loss are dark red, moist, and very painful.  It is difficult to keep areas from becoming soiled since pt is frequently incontinent of loose stools.  This occurred twice during the consult, and with each cleaning session more skin loss occurs. Stool is too thick to be contained by a flexiseal and a fecal incontinence bag would not adhere related to skin loss surrounding rectal area.   Pressure Ulcer POA: This is NOT a pressure ulcer; it is moisture associated skin damage. Inner groin and abd skin folds with partial thickness skin loss with the same appearance; red and moist related to intertriginous dermatitis. Left and right groin each approx 5X7X.1cm, inner abd with partial thickness fissures which are red and moist. Dressing procedure/placement/frequency: Foam dressing to inner groin skin folds to reduce rubbing together and promote healing.  Barrier cream and antifungal powder to protect skin, repel moisture, and promote healing to buttocks. Pt is on an air mattress to reduce pressure and increase airflow to the affected area. Please re-consult if further assistance is needed.  Thank-you,  Julien Girt MSN, [place], [place], [place], [place]

## 2015-04-01 NOTE — Care Management Note (Addendum)
Case Management Note  Patient Details  Name: [name] MRN: [id number] Date of Birth: 19-Oct-1975  Subjective/Objective:      CM following for progression and d/c planning.              Action/Plan: Please see note from earlier today. This afternoon this CM spoke with pt mother as she visited with pt. Pt mother, [name], states that she does not wish for her daughter to return to [facility] and will take her home if she is unable to locate another facility that can meet her needs. She is however interested in the possibility of this pt being transferred to [place], which is the other location of the same facility. This MC notifed the CSW , Kendell Bane and she will work with the facility rep and the pt mother to determine if a move to the other location may be appropriate. If the pt mother determines that she needs to take her home, CM will request orders for bed, speciality mattress, hoyer lift and [place] services. This CM also spoke to [name] re possible PCS services, application of these services must be completed by the pt PCP after she is d/c from the hospital. We will provide [name] with an application.   Expected Discharge Date:                  Expected Discharge Plan:  Skilled Nursing Facility  In-House Referral:  Clinical Social Work  Discharge planning Services  CM Consult  Post Acute Care Choice:  NA Choice offered to:  NA  DME Arranged:    DME Agency:     HH Arranged:    [place] Agency:     Status of Service:     Medicare Important Message Given:    Date Medicare IM Given:    Medicare IM give by:    Date Additional Medicare IM Given:    Additional Medicare Important Message give by:     If discussed at [place] of Stay Meetings, dates discussed:    Additional Comments:  Adron Bene, RN 04/01/2015, 4:06 PM

## 2015-04-01 NOTE — NC FL2 (Signed)
[place] LEVEL OF CARE SCREENING TOOL     IDENTIFICATION  Patient Name: [name] Birthdate: Sep 30, 1975 Sex: female Admission Date (Current Location): 03/29/2015  [facility] and [place] Number:  Herbalist and Address:  The . [facility], [place] [address] [place], [place] [address]      Provider Number: [id number]  Attending Physician Name and Address:  Axel Filler, MD  Relative Name and Phone Number:       Current Level of Care: Hospital Recommended Level of Care: [place] Prior Approval Number:    Date Approved/Denied:   PASRR Number: [id number]  Discharge Plan: SNF    Current Diagnoses: Patient Active Problem List   Diagnosis Date Noted  . ESRD on dialysis ([place])   . Coumadin toxicity 03/29/2015  . Bacteremia   . Pleural effusion exudative   . Pleural effusion, bilateral   . HAP (hospital-acquired pneumonia)   . Thrombocytopenia ([employer])   . Acute blood loss anemia   . Encephalopathy   . Psychosis 03/01/2015  . Pressure ulcer 02/28/2015  . Pain and swelling of left lower leg   . Knee pain   . Sepsis secondary to UTI ([place])   . Lower abdominal pain   . Acute encephalopathy 02/19/2015  . 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 Left hemiparesis ([place])   . 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 ESRD (end stage renal disease) on dialysis ([place])   . Lupus nephritis ([place])     Orientation RESPIRATION BLADDER Height & Weight    Self, Time, Situation, Place  Normal Continent 5\' 3"  (160 cm) 172 lbs.  BEHAVIORAL SYMPTOMS/MOOD NEUROLOGICAL BOWEL NUTRITION STATUS  Other (Comment) (n/a)  (n/a) Incontinent Diet (Please see discharge summary.)  AMBULATORY STATUS COMMUNICATION OF NEEDS Skin   Extensive Assist Verbally PU Stage and Appropriate Care   PU Stage 2 Dressing:  (PRN)                   Personal Care Assistance Level of  Assistance  Bathing, Feeding, Dressing Bathing Assistance: Maximum assistance Feeding assistance: Independent Dressing Assistance: Maximum assistance     Functional Limitations Info   (n/a)          [place]  PT (By licensed PT), OT (By licensed OT)     PT Frequency: 5 OT Frequency: 5            Contractures      Additional Factors Info  Code Status, Allergies, Isolation Precautions Code Status Info: FULL Allergies Info: Food ([place] Peppers)     Isolation Precautions Info: VRE. MDRO     Current Medications (04/01/2015):  This is the current hospital active medication list Current Facility-Administered Medications  Medication Dose Route Frequency Provider Last Rate Last Dose  . 0.9 %  sodium chloride infusion   Intravenous Once Orlie Dakin, MD      . 0.9 %  sodium chloride infusion   Intravenous Once Roney Jaffe, MD      . acetaminophen (TYLENOL) tablet 650 mg  650 mg Oral Q6H PRN Norval Gable, MD   650 mg at 03/31/15 0951  . antiseptic oral rinse (CPC / CETYLPYRIDINIUM CHLORIDE 0.05%) solution 7 mL  7 mL Mouth Rinse BID Axel Filler, MD   7 mL at 04/01/15 1000  . atorvastatin (LIPITOR) tablet 20 mg  20 mg Oral q1800 Anderson Malta T  Randell Patient, MD   20 mg at 03/31/15 1856  . [START ON 04/04/2015] Darbepoetin Alfa (ARANESP) injection 150 mcg  150 mcg Intravenous Q Thu-HD Roney Jaffe, MD      . diclofenac sodium (VOLTAREN) 1 % transdermal gel 2 g  2 g Topical Once PRN Norval Gable, MD      . DULoxetine (CYMBALTA) DR capsule 40 mg  40 mg Oral Daily Rushil Sherrye Payor, MD   40 mg at 04/01/15 1000  . fluconazole (DIFLUCAN) tablet 150 mg  150 mg Oral Daily Axel Filler, MD      . hydroxychloroquine (PLAQUENIL) tablet 400 mg  400 mg Oral Daily Rushil Sherrye Payor, MD   400 mg at 04/01/15 1044  . midodrine (PROAMATINE) tablet 5 mg  5 mg Oral BID WC Milagros Loll, MD   5 mg at 04/01/15 [id number]  . mineral oil-hydrophilic petrolatum (AQUAPHOR)  ointment   Topical BID Milagros Loll, MD      . multivitamin (RENA-VIT) tablet 1 tablet  1 tablet Oral QHS Milagros Loll, MD   1 tablet at 03/31/15 2143  . OLANZapine zydis (ZYPREXA) disintegrating tablet 10 mg  10 mg Oral BID PC Burgess Estelle, MD   10 mg at 04/01/15 1046  . pantoprazole (PROTONIX) EC tablet 20 mg  20 mg Oral Daily Milagros Loll, MD   20 mg at 04/01/15 1051  . polyethylene glycol (MIRALAX / GLYCOLAX) packet 17 g  17 g Oral Daily PRN Milagros Loll, MD      . terbinafine (LAMISIL) 1 % cream   Topical BID Milagros Loll, MD      . warfarin (COUMADIN) tablet 1 mg  1 mg Oral ONCE-1800 Karren Cobble, [facility]      . Warfarin - Pharmacist Dosing Inpatient   Does not apply q1800 Lyndee Leo, [facility]   0  at 03/31/15 1800     Discharge Medications: Please see discharge summary for a list of discharge medications.  Relevant Imaging Results:  Relevant Lab Results:   Additional Information SSN: [id number]  Luna Kitchens [phone]

## 2015-04-01 NOTE — Progress Notes (Signed)
  [facility] Progress Note   Assessment/ Plan:   1. Skin Rash: Maculopapular rash suspected to be drug associated and likely exacerbated by Coumadin-induced confluent ecchymosis/contact dermatitis. Dermatology evaluation as an outpatient. Features not consistent with SLE. 2. ESRD: Continue Tuesday/Thursday/Saturday dialysis schedule-no acute needs noted at this time 3. Anemia: Hemoglobin levels improved, continue to monitor trend for further PRBC needs. 4. CKD-MBD: Poor nutritional intake/low phosphorus -not on binders at this time  5. Nutrition: With low albumin likely secondary to decubitus ulcer/inflammatory complex. 6. Hypertension: With fair blood pressure control on midodrine 7. Deconditioning/sacral decubitus: Ongoing wound care and plans in place to discharge her to a new skill nursing facility as questions/concerns are raised regarding her care at her previous facility.  Subjective:   Reports to be feeling tired and in discomfort from sacral decubitus-denies any chest pain or shortness of breath    Objective:   BP 112/59 mmHg  Pulse 129  Temp(Src) 98.2 F (36.8 C) (Oral)  Resp 17  Ht 5\' 3"  (1.6 m)  Wt 78.2 kg (172 lb 6.4 oz)  BMI 30.55 kg/m2  SpO2 100%  LMP 03/18/2015  Physical Exam: Gen: Appears miserable resting in bed-currently on her side as she gets care for her sacral decubitus CVS: Pulse regular tachycardia, S1 and S2 with ESM Resp: Poor inspiratory effort-clear to auscultation Abd: Soft, obese, nontender Ext: No lower extremity edema Skin: Maculopapular rash with confluent areas of ecchymosis/erythema over her trunk/back as well as upper and lower extremities  Labs: BMET  Recent Labs Lab 03/29/15 1920 03/30/15 0524 03/31/15 1040  NA 134* 137 137  K 3.3* 2.4* 3.9  CL 95* 96* 98*  CO2 28 29 27   GLUCOSE 72 64* 80  BUN <5* 6 6  CREATININE 2.53* 2.71* 1.81*  CALCIUM 8.7* 9.2 9.1  PHOS  --   --  1.2*   CBC  Recent Labs Lab  03/29/15 1920 03/30/15 0524 03/30/15 1710 03/31/15 1040  WBC 8.6 3.4* 4.2 6.0  NEUTROABS 7.4  --   --   --   HGB 7.7* 6.7* 7.5* 11.2*  HCT 23.2* 21.3* 25.4* 34.7*  MCV 95.9 96.4 95.5 92.3  PLT 112* 36* 45* 41*   Medications:    . sodium chloride   Intravenous Once  . sodium chloride   Intravenous Once  . antiseptic oral rinse  7 mL Mouth Rinse BID  . atorvastatin  20 mg Oral q1800  . [START ON 04/04/2015] darbepoetin (ARANESP) injection - DIALYSIS  150 mcg Intravenous Q Thu-HD  . DULoxetine  40 mg Oral Daily  . hydroxychloroquine  400 mg Oral Daily  . midodrine  5 mg Oral BID WC  . mineral oil-hydrophilic petrolatum   Topical BID  . multivitamin  1 tablet Oral QHS  . OLANZapine zydis  10 mg Oral BID PC  . pantoprazole  20 mg Oral Daily  . terbinafine   Topical BID  . Warfarin - Pharmacist Dosing Inpatient   Does not apply [id number]   Elmarie Shiley, MD 04/01/2015, 10:57 AM

## 2015-04-02 LAB — PROTIME-INR
INR: 5.46 (ref 0.00–1.49)
Prothrombin Time: 48 seconds — ABNORMAL HIGH (ref 11.6–15.2)

## 2015-04-02 LAB — CBC WITH DIFFERENTIAL/PLATELET
Basophils Absolute: 0 10*3/uL (ref 0.0–0.1)
Basophils Relative: 0 %
Eosinophils Absolute: 0 10*3/uL (ref 0.0–0.7)
Eosinophils Relative: 0 %
HEMATOCRIT: 29 % — AB (ref 36.0–46.0)
HEMOGLOBIN: 9.4 g/dL — AB (ref 12.0–15.0)
LYMPHS ABS: 0.5 10*3/uL — AB (ref 0.7–4.0)
Lymphocytes Relative: 7 %
MCH: 30.4 pg (ref 26.0–34.0)
MCHC: 32.4 g/dL (ref 30.0–36.0)
MCV: 93.9 fL (ref 78.0–100.0)
MONOS PCT: 3 %
Monocytes Absolute: 0.2 10*3/uL (ref 0.1–1.0)
NEUTROS ABS: 6.3 10*3/uL (ref 1.7–7.7)
NEUTROS PCT: 90 %
Platelets: 37 10*3/uL — ABNORMAL LOW (ref 150–400)
RBC: 3.09 MIL/uL — ABNORMAL LOW (ref 3.87–5.11)
RDW: 18.8 % — ABNORMAL HIGH (ref 11.5–15.5)
WBC: 7 10*3/uL (ref 4.0–10.5)

## 2015-04-02 LAB — RENAL FUNCTION PANEL
ANION GAP: 16 — AB (ref 5–15)
Albumin: 1.6 g/dL — ABNORMAL LOW (ref 3.5–5.0)
BUN: 18 mg/dL (ref 6–20)
CO2: 23 mmol/L (ref 22–32)
Calcium: 10.4 mg/dL — ABNORMAL HIGH (ref 8.9–10.3)
Chloride: 99 mmol/L — ABNORMAL LOW (ref 101–111)
Creatinine, Ser: 3.99 mg/dL — ABNORMAL HIGH (ref 0.44–1.00)
GFR calc Af Amer: 15 mL/min — ABNORMAL LOW (ref >60)
GFR calc non Af Amer: 13 mL/min — ABNORMAL LOW (ref >60)
GLUCOSE: 80 mg/dL (ref 65–99)
PHOSPHORUS: 1.3 mg/dL — AB (ref 2.5–4.6)
POTASSIUM: 3.7 mmol/L (ref 3.5–5.1)
Sodium: 138 mmol/L (ref 135–145)

## 2015-04-02 MED ORDER — AQUAPHOR EX OINT
TOPICAL_OINTMENT | Freq: Two times a day (BID) | CUTANEOUS | Status: DC
Start: 2015-04-02 — End: 2015-04-07

## 2015-04-02 MED ORDER — TERBINAFINE HCL 1 % EX CREA: TOPICAL_CREAM | Freq: Two times a day (BID) | CUTANEOUS | Status: DC

## 2015-04-02 MED ORDER — PRO-STAT SUGAR FREE PO LIQD
30.0000 mL | Freq: Two times a day (BID) | ORAL | Status: DC
  Administered 2015-04-02 – 2015-04-03 (×2): 30 mL via ORAL
  Filled 2015-04-02 (×4): qty 30

## 2015-04-02 MED ORDER — BOOST / RESOURCE BREEZE PO LIQD
1.0000 | Freq: Three times a day (TID) | ORAL | Status: DC
  Administered 2015-04-02 – 2015-04-03 (×2): 1 via ORAL
  Filled 2015-04-02 (×2): qty 1

## 2015-04-02 MED ORDER — FLUCONAZOLE 150 MG PO TABS: 150.0000 mg | ORAL_TABLET | Freq: Every day | ORAL | Status: DC

## 2015-04-02 NOTE — Progress Notes (Signed)
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 Food Swelling    Red peppers    Patient Measurements: Height: 5\' 3"  (160 cm) Weight: 172 lb 13.5 oz (78.4 kg) IBW/kg (Calculated) : 52.4  Vital Signs: Temp: 97.9 F (36.6 C) (01/03 0613) Temp Source: Oral (01/03 0613) BP: 91/52 mmHg (01/03 0613) Pulse Rate: 115 (01/03 0613)  Labs:  Recent Labs  03/31/15 1040 04/01/15 1130 04/02/15 1020 04/02/15 1219  HGB 11.2* 10.8*  --  9.4*  HCT 34.7* 31.4*  --  29.0*  PLT 41* 63*  --  37*  LABPROT 26.3*  --  48.0*  --   INR 2.45*  --  5.46*  --   CREATININE 1.81*  --   --   --     Estimated Creatinine Clearance: 41.4 mL/min (by C-G formula based on Cr of 1.81).  Assessment:   INR back up to 5.46, supratherapeutic.   No INR on 1/2, but was 2.45, at goal on 03/31/15.   Coumadin 1 mg given on 1/1 and 04/01/15.   Platelet count down to 37. No bleeding noted.   Goal of Therapy:  INR 2-3 Monitor platelets by anticoagulation protocol: Yes   Plan:   No Coumadin today.  Continue daily PT/INR.  Follow for any bleeding.  Arty Baumgartner, [place] Pager: [phone] 04/02/2015,12:29 PM

## 2015-04-02 NOTE — Progress Notes (Signed)
Patient heart rate sustained in the 130-140  Asymptomatic. Denies chest pain denies s.o.b. Dr. Posey Pronto notified. Patient rinsed back received 3.5 hours of 4 hour treatment. Patient alert & oriented x4 but weak and speaking in low tone.

## 2015-04-02 NOTE — Clinical Social Work Note (Signed)
[name] will discharge to [facility] skilled nursing facility today.  Discharge information transmitted to facility and patient will be transported by ambulance.  Patient mother, [name] was advised that [facility] can take patient and will be contacted once transport called.   [name], MSW, LCSW Licensed Clinical Social Worker [place] [phone]

## 2015-04-02 NOTE — Discharge Summary (Signed)
Name: [name] MRN: [id number] DOB: [birth year] 40 y.o. PCP: Lin Landsman, MD  Date of Admission: 03/29/2015  5:00 PM Date of Discharge: 04/02/2015 Attending Physician: Axel Filler, MD  Discharge Diagnosis: 1. Lupus nephritis admitted for acute encephalopathy Active Problems:   Coumadin toxicity   ESRD on dialysis [facility])  Discharge Medications:   Medication List    STOP taking these medications        cefTAZidime 2 g in dextrose 5 % 50 mL     metoprolol succinate 25 MG 24 hr tablet  Commonly known as:  TOPROL-XL     Vancomycin 750 MG/150ML Soln  Commonly known as:  VANCOCIN     warfarin 2.5 MG tablet  Commonly known as:  COUMADIN      TAKE these medications        atorvastatin 20 MG tablet  Commonly known as:  LIPITOR  Take 1 tablet (20 mg total) by mouth daily at 6 PM.     cyclobenzaprine 10 MG tablet  Commonly known as:  FLEXERIL  Take 1 tablet (10 mg total) by mouth at bedtime as needed for muscle spasms.     Darbepoetin Alfa 200 MCG/0.4ML Sosy injection  Commonly known as:  ARANESP  Inject 0.4 mLs (200 mcg total) into the vein every Thursday with hemodialysis.     DULoxetine HCl 40 MG Cpep  Take 40 mg by mouth daily.     feeding supplement (PRO-STAT SUGAR FREE 64) Liqd  Take 30 mLs by mouth daily.     feeding supplement Liqd  Take 1 Container by mouth daily.     ferric gluconate 125 mg in sodium chloride 0.9 % 100 mL  Inject 125 mg into the vein Every Tuesday,Thursday,and Saturday with dialysis.     fluconazole 150 MG tablet  Commonly known as:  DIFLUCAN  Take 1 tablet (150 mg total) by mouth daily.     hydroxychloroquine 200 MG tablet  Commonly known as:  PLAQUENIL  Take 2 tablets (400 mg total) by mouth daily.     METOPROLOL TARTRATE PO  Take 25 mg by mouth daily. Take 0.5 tablet daily for hypertension     midodrine 5 MG tablet  Commonly known as:  PROAMATINE  Take 1 tablet (5 mg total) by mouth 2 (two) times daily with a  meal.     mineral oil-hydrophilic petrolatum ointment  Apply topically 2 (two) times daily.     multivitamin Tabs tablet  Take 1 tablet by mouth at bedtime.     OLANZapine zydis 10 MG disintegrating tablet  Commonly known as:  ZYPREXA  Take 1 tablet (10 mg total) by mouth 2 (two) times daily after a meal.     oxyCODONE-acetaminophen 5-325 MG tablet  Commonly known as:  PERCOCET/ROXICET  Take 1 tablet by mouth every 6 (six) hours as needed for severe pain.     pantoprazole 20 MG tablet  Commonly known as:  PROTONIX  Take 1 tablet (20 mg total) by mouth daily.     polyethylene glycol packet  Commonly known as:  MIRALAX / GLYCOLAX  Take 17 g by mouth daily as needed for mild constipation.     terbinafine 1 % cream  Commonly known as:  LAMISIL  Apply topically 2 (two) times daily.     trimethobenzamide 300 MG capsule  Commonly known as:  TIGAN  Take 1 capsule (300 mg total) by mouth every 8 (eight) hours as needed for nausea/vomiting.     TUMS  PO  Take 1-2 tablets by mouth daily as needed (heartburn).        Disposition and follow-up:   [name] was discharged from [facility] in Stable condition.  At the hospital follow up visit please address:  1.  Supratherapeutic INR- her INR was 5.5 on discharge. We held her coumadin for 2 days. She will need to resume this at the appropriate dosing per pharmacy.  Sacral wounds- she needs thorough wound care as there is significant skin breakdown. At the SNF, she needs to be on an airbed with frequent re-positioning of the patient, and also thorough ambulation, and out of the bed.   Rehab: At [facility], she needs intensive rehabilitation. At the previous SNF she was only getting 20 minutes of rehab twice a week, which is unacceptable.   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 Plaquenil needs to be continued  Maculopapular rash: she has mainly dry skin induced  rash with some features of contact dermatitis and possibly coumadin induced ecchymoses. We prescribed her the creams which she needs to be applied.  Depression: continue with Cymbalta and olanzapine    2.  Labs / imaging needed at time of follow-up:  Daily INR/PT, CBC BMET   3.  Pending labs/ test needing follow-up:   Follow-up Appointments:     Follow-up Information    Follow up with REESE,BETTI D, MD. Schedule an appointment as soon as possible for a visit in 3 weeks.   Specialty:  Family Medicine   Contact information:   [id number] [place] [id number] [phone]       Discharge Instructions: Discharge Instructions    Diet - low sodium heart healthy    Complete by:  As directed      Increase activity slowly    Complete by:  As directed            Consultations: Treatment Team:  Roney Jaffe, MD  Procedures Performed:  Dg Chest 1 View  03/17/2015  CLINICAL DATA:  Status post right-sided thoracentesis EXAM: CHEST  1 VIEW COMPARISON:  03/16/2015 FINDINGS: Cardiac shadow is enlarged. A right-sided thoracentesis has been performed with reduction in right-sided pleural effusion. No pneumothorax is seen. A stable left axillary stent is noted. No bony abnormality is seen. IMPRESSION: No pneumothorax following right thoracentesis. Electronically Signed   By: Inez Catalina M.D.   On: 03/17/2015 10:54   Dg Chest 1 View  03/11/2015  CLINICAL DATA:  40 year old female status post left-sided thoracentesis yielding bloody fluid. EXAM: CHEST 1 VIEW COMPARISON:  Chest x-ray 03/08/2015. FINDINGS: Moderate bilateral pleural effusions (right greater than left) with associated bibasilar opacities which may reflect areas of atelectasis and/or airspace consolidation no pneumothorax. Perihilar consolidation noted on the prior study appears improved on today's examination. No associated cephalization of the pulmonary vasculature. Heart size is upper limits of normal. Upper  mediastinal contours are within normal limits. Atherosclerotic calcifications in the arch of the thoracic aorta. IMPRESSION: 1. No postprocedural pneumothorax. 2. Moderate bilateral pleural effusions (right greater than left) with atelectasis and/or consolidation throughout the lung bases bilaterally. 3. Decreasing perihilar airspace consolidation compared to prior examination. 4. Atherosclerosis. Electronically Signed   By: Vinnie Langton M.D.   On: 03/11/2015 15:27   Ct Head Wo Contrast  03/29/2015  CLINICAL DATA:  Initial encounter for episodes of somnolence EXAM: CT HEAD WITHOUT CONTRAST TECHNIQUE: Contiguous axial images were obtained from the base  of the skull through the vertex without intravenous contrast. COMPARISON:  02/22/2015. FINDINGS: Area of hypo attenuation in the posterior right frontal lobe is stable since the prior study and was characterized on previous MRI as infarct. No evidence for new acute infarct on today's study. No evidence for acute hemorrhage, hydrocephalus, or mass lesion. No midline shift. No abnormal extra-axial fluid collection. The visualized paranasal sinuses and mastoid air cells are clear. IMPRESSION: Stable appearance of posterior right frontal encephalomalacia, in an area of known infarct. No new or progressive findings on today's exam. Electronically Signed   By: Misty Stanley M.D.   On: 03/29/2015 21:33   Ct Chest Wo Contrast  03/18/2015  CLINICAL DATA:  Shortness of Breath EXAM: CT CHEST WITHOUT CONTRAST TECHNIQUE: Multidetector CT imaging of the chest was performed following the standard protocol without IV contrast. COMPARISON:  Chest CT March 10, 2015; chest radiograph March 17, 2015 FINDINGS: Mediastinum/Lymph Nodes: Visualized thyroid appears normal. There are multiple small mediastinal lymph nodes without frank adenopathy. There is a fairly small pericardial effusion. There are scattered foci of coronary artery calcification. There is no thoracic  aortic aneurysm. Visualized great vessels appear unremarkable on this noncontrast enhanced study. Heart is mildly enlarged. Lungs/Pleura: There are persistent moderate pleural effusions bilaterally with bibasilar consolidation. The ground-glass opacity noted in the upper lobes on recent prior study has resolved. There is no new opacity in the lung parenchyma. On axial slice 25 series 3, there is a 4 mm nodular opacity in the lateral segment right middle lobe. Upper abdomen: No focal lesions are identified in the visualized upper abdomen region. Musculoskeletal: No blastic or lytic bone lesions. IMPRESSION: Moderate pleural effusions bilaterally with stable bibasilar consolidation. Some of this consolidation is new atelectatic in etiology, although there is concern for bibasilar pneumonia as well. The previously noted upper lobe ground-glass opacity has resolved bilaterally. 4 mm nodular opacity lateral segment right middle lobe. Followup of this nodular opacity should be based on Fleischner Society guidelines. If the patient is at high risk for bronchogenic carcinoma, follow-up chest CT at 1 year is recommended. If the patient is at low risk, no follow-up is needed. This recommendation follows the consensus statement: Guidelines for Management of Small Pulmonary Nodules Detected on CT Scans: A Statement from the [employer] as published in Radiology 2005; 237:395-400. Cardiomegaly with small pericardial effusion. Scattered foci of coronary artery calcification. Small mediastinal lymph nodes without adenopathy by size criteria. Electronically Signed   By: Lowella Grip III M.D.   On: 03/18/2015 18:48   Ct Chest Wo Contrast  03/10/2015  CLINICAL DATA:  Cough, pulmonary infiltrate, end-stage renal disease EXAM: CT CHEST WITHOUT CONTRAST TECHNIQUE: Multidetector CT imaging of the chest was performed following the standard protocol without IV contrast. COMPARISON:  Images of the lung bases CT scan  01/14/2015 FINDINGS: Study is markedly limited by streak artifacts from patient's large body habitus. Limited study without IV contrast. Images of the thoracic inlet are unremarkable. Central airways are patent. Cardiomegaly is noted. No pericardial effusion. The visualized upper abdomen is unremarkable. No significant hilar adenopathy. There is moderate to large bilateral pleural effusion. There is significant atelectasis bilateral lower lobe posteriorly. Elevation of the right hemidiaphragm is noted. There is nodular mass or adenopathy in right upper lobe medially. Best seen in axial image 16 measures about 3.7 cm. There is nodular pleural thickening in right lower lobe. Pleural disease cannot be excluded. Further correlation with enhanced study and analysis of pleural fluid is recommended.  There is central ground-glass attenuation bilateral upper lobe suspicious for pulmonary edema or less likely alveolar infiltrates. No destructive bony lesions are noted. Sagittal images of the spine shows degenerative changes thoracic spine. Fatty infiltration of the liver is noted. IMPRESSION: Markedly limited study without IV contrast. The study is limited by patient's large body habitus. 1. Bilateral moderate to large size pleural effusion. Bilateral lower lobe significant atelectasis. There is nodular pleural thickening in right lower lobe laterally see axial image 24. Pleural disease or metastatic disease cannot be excluded. 2. There is nodular mass or adenopathy in right upper lobe medially measures at least 3.7 cm. Further correlation with enhanced study is recommended. Pleural fluid analysis also recommended 3. Cardiomegaly is noted. 4. Degenerative changes thoracic spine. 5. There is central ground-glass attenuation bilateral upper lobes. Findings suspicious for pulmonary edema or less likely infiltrates. Electronically Signed   By: Lahoma Crocker M.D.   On: 03/10/2015 14:32   Dg Chest Portable 1 View  03/29/2015   CLINICAL DATA:  Subsequent encounter for lupus with concern for sepsis of unknown source. EXAM: PORTABLE CHEST 1 VIEW COMPARISON:  03/17/2015. FINDINGS: 1838 hours. The cardio pericardial silhouette is enlarged. Bibasilar atelectasis/ pneumonia persists with some improvement in aeration at the left base. No evidence for overt airspace pulmonary edema. Imaged bony structures of the thorax are intact. Telemetry leads overlie the chest. IMPRESSION: Cardiomegaly with left greater than right basilar atelectasis or pneumonia. Aeration at the left base is improved in the interval. Electronically Signed   By: Misty Stanley M.D.   On: 03/29/2015 18:47   Dg Chest Port 1 View  03/16/2015  CLINICAL DATA:  Fever, lupus, stroke, end-stage renal disease, TIA, hypertension, arrhythmias EXAM: PORTABLE CHEST 1 VIEW COMPARISON:  Portable exam 1108 hours compared to 03/11/2015 FINDINGS: Rotation to the LEFT. Enlargement of cardiac silhouette with pulmonary vascular congestion. Mediastinal contours normal. Bibasilar effusions. Atelectasis versus consolidation LEFT lower lobe with minimal atelectasis at RIGHT base. Upper lungs clear. No pneumothorax. Bones unremarkable. IMPRESSION: Enlargement of cardiac silhouette with pulmonary vascular congestion. Bibasilar effusions with minimal RIGHT basilar atelectasis and more pronounced atelectasis versus consolidation in LEFT lower lobe. Electronically Signed   By: Lavonia Dana M.D.   On: 03/16/2015 11:29   Dg Chest Port 1 View  03/08/2015  CLINICAL DATA:  Intermittent shortness of breath.  Lupus. EXAM: PORTABLE CHEST 1 VIEW COMPARISON:  02/19/2015 FINDINGS: Perihilar airspace opacity and layering pleural effusions which are new. Chronic cardiopericardial enlargement. Stable upper mediastinal contours. Left-sided venous stenting. IMPRESSION: Perihilar airspace disease and layering pleural effusions, new from 02/19/2015 and likely overload/CHF. Electronically Signed   By: Monte Fantasia  M.D.   On: 03/08/2015 12:01   US Thoracentesis Asp Pleural Space W/img Guide  03/17/2015  INDICATION: Symptomatic right sided pleural effusion EXAM: US THORACENTESIS ASP PLEURAL SPACE W/IMG GUIDE COMPARISON:  Thoracentesis 03/11/2015. MEDICATIONS: None COMPLICATIONS: None immediate TECHNIQUE: Informed written consent was obtained from the patient after a discussion of the risks, benefits and alternatives to treatment. A timeout was performed prior to the initiation of the procedure. Initial ultrasound scanning demonstrates a right loculated pleural effusion. The lower chest was prepped and draped in the usual sterile fashion. 1% lidocaine was used for local anesthesia. Under direct ultrasound guidance, a 19 gauge, 7-cm, Yueh catheter was introduced. An ultrasound image was saved for documentation purposes. The thoracentesis was performed. The catheter was removed and a dressing was applied. The patient tolerated the procedure well without immediate post procedural complication. The  patient was escorted to have an upright chest radiograph. FINDINGS: A total of approximately 60 ml of dark bloody fluid was removed. Requested samples were sent to the laboratory. IMPRESSION: Successful ultrasound-guided right sided thoracentesis yielding 60 ml of pleural fluid. Read By:  Tsosie Billing PA-C Electronically Signed   By: Marybelle Killings M.D.   On: 03/17/2015 10:32   US Thoracentesis Asp Pleural Space W/img Guide  03/11/2015  INDICATION: Symptomatic left sided pleural effusion EXAM: US THORACENTESIS ASP PLEURAL SPACE W/IMG GUIDE COMPARISON:  None. MEDICATIONS: 10 cc 1% lidocaine COMPLICATIONS: None immediate TECHNIQUE: Informed written consent was obtained from the patient after a discussion of the risks, benefits and alternatives to treatment. A timeout was performed prior to the initiation of the procedure. Initial ultrasound scanning demonstrates a left pleural effusion. The lower chest was prepped and draped in the  usual sterile fashion. 1% lidocaine was used for local anesthesia. Under direct ultrasound guidance, a 19 gauge, 7-cm, Yueh catheter was introduced. An ultrasound image was saved for documentation purposes. The thoracentesis was performed. The catheter was removed and a dressing was applied. The patient tolerated the procedure well without immediate post procedural complication. The patient was escorted to have an upright chest radiograph. FINDINGS: A total of approximately 300 cc of bloody fluid was removed. Requested samples were sent to the laboratory. IMPRESSION: Successful ultrasound-guided left sided thoracentesis yielding 300 cc of pleural fluid. Read by:  Lavonia Drafts [facility] Electronically Signed   By: Lucrezia Europe M.D.   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 She continues to have significant pain in her buttocks region, especially when sitting up for HD sessions although she is now able to sit up for all 4 hours. Her mom has tried putting moisturizing lotions on her legs and stomach but it only helps intermittently. Since yesterday, she has become increasingly fatigued as well. She says that occasionally, she gets short of breath but this is improving since her previous admission. She denies fevers or chills, headache, vision changes, chest pain, palpitations, cough, nausea, vomiting, abdominal pain, any bowel symptoms, including melena or hematochezia, focal weakness or numbness, or  other symptoms at this time.  Her mom called her rheumatologist, Dr. Charlestine Night, who had concern for a lupus flare with cutaneous manifestations. Her INR was checked at the SNF yesterday and was over 4. In the ED, she was also found to have blood-tinged stool although she denies noticing these previously, INR 5.7, Hgb down to 7.7 from 10.7 one week ago, so she was given FFP. CT head was also done given her h/o stroke, INR, and increased somnolence, which was negative for any changes from previous imaging.    Hospital Course by problem list: Active Problems:   Coumadin toxicity   ESRD on dialysis ([place])  Supratherapeutic INR: On discharge the INR was around 5.3. Pt is asked to hold the coumadin for 2 days and the coumadin dosing will be managed  at the SNF per pharmacy.   Sacral wounds- 2 wounds were identified with significant skin breakdown. She needs thorough wound care   Acute Encephalopathy:possibly medication induced- we held the olanazapine but likely it was multifactorial as her hgb had also dropped. Her mental status improved considerably on her own. She was transfused 2 units of PRBC.   Maculopapular Rash: Her rash has been present for the last 6 days, and it is not painful, but is diffusely located on the abdomen, thighs, legs, upper arm and feet. The rash is maculopapular. She seems to have coumadin induced confluent ecchymoses and also contact dermatitis like features on the upper part, and also the plantar feet. She had just finished her certaz and finished vanc . Holding antibiotics right now. Do not see any s/s of infections. Do not think this may be a lupus flare. We do not have dermatology to do skin biopsy but she can have that done when she is discharged where it is available on an outpatient basis. We gave her aquaphor , lamisil, cream. Coumadin was resumed per pharmacy but then held on discharge due to supra therapeutic INR.   Lupus nephritis, ESRD on TTSa HD through L thigh  AVG. Dialysis was continued during her hospitalization, Plaquenil was resumed.   H/o recurrent polymicrobial UTIs: She completed her 14-day course of vancomycin and ceftazidime during HD 12/30. She has had history of recurrent UTIs and the cultures in the past have been resistant to a lot of antibiotics- including vancomycin. It had grown VRE, and also had candida and klebsiella. She had a repeat UA done yesterday which showed large leuks and nitrites and was turbid. The urine culture suggested recollection, however as she is an ESRD patient, she makes very little urine so the urine cultures are not going to be of any help. The cultures in the past was sensitive to linezolid. However, given her current thrombocytopenia And given that she is afebrile and not systemically sick, we are holding any antibiotics.    HgB drop: 10.8-->--> 6.7 on 12/31 AM. She has had progressive fatigue. INR was 5.7. FOBT +. Plts are stable Possibly lower GI source. She was given 2 units of PRBC. Her hemoglobin had improved at discharge.   Major depression with psychotic features - no recurrent hallucinations or delusions since previous admission. We resumed her olanzapine, and continued cymbalta  Deconditioning: She was seen by PT.   PPX - DVT: SCDs; coumadin held   Discharge Vitals:   BP 90/64 mmHg  Pulse 125  Temp(Src) 98.2 F (36.8 C) (Oral)  Resp 22  Ht 5\' 3"  (1.6 m)  Wt 172 lb 13.5 oz (78.4 kg)  BMI 30.63 kg/m2  SpO2 100%  LMP 03/18/2015  Discharge Labs:  Results for orders placed or performed during the hospital encounter of 03/29/15 (from the past 24 hour(s))  Protime-INR     Status: Abnormal   Collection Time: 04/02/15 10:20 AM  Result Value Ref Range   Prothrombin Time 48.0 (H) 11.6 - 15.2 seconds   INR 5.46 (HH) 0.00 - 1.49  CBC with Differential/Platelet     Status: Abnormal   Collection Time: 04/02/15 12:19 PM  Result Value Ref Range   WBC 7.0 4.0 - 10.5 K/uL   RBC 3.09 (L) 3.87 - 5.11  MIL/uL   Hemoglobin 9.4 (L) 12.0 - 15.0 g/dL   HCT 29.0 (L) 36.0 - 46.0 %   MCV 93.9 78.0 - 100.0 fL   MCH 30.4 26.0 - 34.0 pg  MCHC 32.4 30.0 - 36.0 g/dL   RDW 18.8 (H) 11.5 - 15.5 %   Platelets 37 (L) 150 - 400 K/uL   Neutrophils Relative % 90 %   Neutro Abs 6.3 1.7 - 7.7 K/uL   Lymphocytes Relative 7 %   Lymphs Abs 0.5 (L) 0.7 - 4.0 K/uL   Monocytes Relative 3 %   Monocytes Absolute 0.2 0.1 - 1.0 K/uL   Eosinophils Relative 0 %   Eosinophils Absolute 0.0 0.0 - 0.7 K/uL   Basophils Relative 0 %   Basophils Absolute 0.0 0.0 - 0.1 K/uL  Renal function panel     Status: Abnormal   Collection Time: 04/02/15 12:19 PM  Result Value Ref Range   Sodium 138 135 - 145 mmol/L   Potassium 3.7 3.5 - 5.1 mmol/L   Chloride 99 (L) 101 - 111 mmol/L   CO2 23 22 - 32 mmol/L   Glucose, Bld 80 65 - 99 mg/dL   BUN 18 6 - 20 mg/dL   Creatinine, Ser 3.99 (H) 0.44 - 1.00 mg/dL   Calcium 10.4 (H) 8.9 - 10.3 mg/dL   Phosphorus 1.3 (L) 2.5 - 4.6 mg/dL   Albumin 1.6 (L) 3.5 - 5.0 g/dL   GFR calc non Af Amer 13 (L) >60 mL/min   GFR calc Af Amer 15 (L) >60 mL/min   Anion gap 16 (H) 5 - 15    Signed: Burgess Estelle, MD 04/02/2015, 1:42 PM    Services Ordered on Discharge:  Equipment Ordered on Discharge:

## 2015-04-02 NOTE — Progress Notes (Addendum)
CRITICAL VALUE ALERT  Critical value received:  PT-48 & INR 5.46  Date of notification:  04/02/15  Time of notification:  11:40 am  Critical value read back:Yes.    Nurse who received alert:  Alden Server, RN  MD notified (1st page):  Dr. Benay Pike  Time of first page:  11:43 am  MD notified (2nd page):  Time of second page:  Responding MD: Dr. Benay Pike  Time MD responded:  11:44 am

## 2015-04-02 NOTE — Progress Notes (Signed)
Patient came back from dialysis, went in recliner today.  While changing her noted bright red blood from the bottom, blotted but seemed it was still coming slowly, made Dr. Burgess Estelle aware of the situation.  He came and looked at her bottom and agreed to keep her one more night to monitor her PT/INR which was critically high.  Patient also wanted to stay one more night as she was feeling tired.  Called the facility and spoke with Daria Pastures, LPN and made her aware of the situation.

## 2015-04-02 NOTE — Progress Notes (Signed)
Physical Therapy Treatment Patient Details Name: [name] MRN: [id number] DOB: Jan 17, 1976 Today's Date: 04/02/2015    History of Present Illness pt presents with Coumadin Toxicity and has had multiple recent admits.  pt has PMH significant for Lupus, CVA, ESRD, Fibromyalgia, and Anemia.      PT Comments    Pt with improved activity tolerance this date however overall remains severely debilitated with extremely poor skin integrity. Pt con't to require maxAx2 for all transfers. Acute PT to con't to follow to progress mobility as able.  Follow Up Recommendations  SNF     Equipment Recommendations  None recommended by PT    Recommendations for Other Services       Precautions / Restrictions Precautions Precautions: Fall Precaution Comments: high anxiety, risk for skin tear Restrictions Weight Bearing Restrictions: No    Mobility  Bed Mobility Overal bed mobility: Needs Assistance Bed Mobility: Supine to Sit;Sit to Supine     Supine to sit: Max assist;+2 for physical assistance Sit to supine: Max assist;+2 for physical assistance   General bed mobility comments: pt initiated transfer but requires maxA to bring LE/hips to EOB and for trunk Elevation, maxA to bring LEs back into bed as well  Transfers Overall transfer level: Needs assistance Equipment used:  (2 person lift with bed pad) Transfers: Sit to/from Stand Sit to Stand: Max assist;+2 physical assistance         General transfer comment: pt completed 2 attempts, unable to achieve full upright standing despite blocking of knee. pt unable to tolerate > 10 sec  Ambulation/Gait             General Gait Details: unable/unsafe to attempt   Stairs            Wheelchair Mobility    Modified Rankin (Stroke Patients Only)       Balance Overall balance assessment: Needs assistance Sitting-balance support: Feet supported;Bilateral upper extremity supported Sitting balance-Leahy Scale:  Fair Sitting balance - Comments: pt sat EOB x 10 min and completed LAQ with supervision                            Cognition Arousal/Alertness: Awake/alert Behavior During Therapy: Flat affect Overall Cognitive Status: Impaired/Different from baseline               Problem Solving: Slow processing;Difficulty sequencing;Requires verbal cues;Requires tactile cues      Exercises General Exercises - Lower Extremity Ankle Circles/Pumps: Both;Other (comment);Seated Long Arc Quad: Strengthening;Both;10 reps;Seated;AAROM    General Comments        Pertinent Vitals/Pain Pain Assessment: Faces Faces Pain Scale: Hurts whole lot Pain Location: back Pain Descriptors / Indicators: Constant Pain Intervention(s): Repositioned    Home Living                      Prior Function            PT Goals (current goals can now be found in the care plan section) Progress towards PT goals: Progressing toward goals    Frequency  Min 2X/week    PT Plan Current plan remains appropriate    Co-evaluation             End of Session Equipment Utilized During Treatment: Oxygen Activity Tolerance: Patient limited by fatigue;Patient limited by pain Patient left: in bed;with call bell/phone within reach     Time: [id number] PT Time Calculation (min) (ACUTE ONLY): 18 min  Charges:  $Therapeutic Activity: 8-22 mins                    G Codes:      Kingsley Callander 04/02/2015, 11:39 AM   Kittie Plater, PT, DPT Pager #: [phone] Office #: [phone]

## 2015-04-02 NOTE — Procedures (Signed)
Patient seen on Hemodialysis. QB 400, UF goal 1.5L Treatment adjusted as needed.  Elmarie Shiley MD [facility]. Office # [phone] Pager # [phone] 1:37 PM

## 2015-04-02 NOTE — Progress Notes (Signed)
Subjective:  Patient seen and examined at bedside. Yesterday, there were some concerns with the swallowing- I had re-examined her at bedside, she was able to swallow water with a cup. Also, SLP re-evaluated her and they recommend modified diet.  Wound care following.   As patient is getting poor care at [facility], CSW is in the process of finding a a different SNF and they told me that they may have one at [facility].  We discussed with both the patient and mother present at bedside and pt agrees about [facility].   She denies any f/c/n/v/d/c.    Objective: Vital signs in last 24 hours: Filed Vitals:   04/01/15 1021 04/01/15 1705 04/01/15 2043 04/02/15 0613  BP: 112/59 91/47 99/52  91/52  Pulse: 129 115 122 115  Temp: 98.2 F (36.8 C) 99.7 F (37.6 C) 97.8 F (36.6 C) 97.9 F (36.6 C)  TempSrc: Oral Oral Oral Oral  Resp: 17 18  16   Height:      Weight:   172 lb 13.5 oz (78.4 kg)   SpO2: 100% 98% 100% 100%   Weight change:   Intake/Output Summary (Last 24 hours) at 04/02/15 0941 Last data filed at 04/02/15 0500  Gross per 24 hour  Intake    300 ml  Output      1 ml  Net    299 ml   Gen: alert and oriented to person, place, and time, no acute distress, more alert today as compared to yesterday CV: unlabored respirations  Abdominal: obese, non-tender, non-distended, without rebound, guarding, or masses Extremities: Warm and well perfused, 2+ edema of the left thigh around graft site, 1+ distal LLE edema. Bandages around L femoral graft  Neuro: CN II-XII intact grossly, no focal neurologic deficits Skin: maculopapular rash, ecchymoses, and scaling present, also non-blanching lesions.     Lab Results: reviewed  Micro Results: Recent Results (from the past 240 hour(s))  Blood Culture (routine x 2)     Status: None (Preliminary result)   Collection Time: 03/29/15  8:34 PM  Result Value Ref Range Status   Specimen Description BLOOD RIGHT HAND  Final   Special Requests BOTTLES DRAWN AEROBIC ONLY 5CC  Final   Culture NO GROWTH 3 DAYS  Final   Report Status PENDING  Incomplete  Blood Culture (routine x 2)     Status: None (Preliminary result)   Collection Time: 03/29/15  8:54 PM  Result Value Ref Range Status   Specimen Description BLOOD RIGHT HAND  Final   Special Requests BOTTLES DRAWN AEROBIC ONLY 5CC  Final   Culture NO GROWTH 2 DAYS  Final   Report Status PENDING  Incomplete  MRSA PCR Screening     Status: None   Collection Time: 03/29/15 11:51 PM  Result Value Ref Range Status   MRSA by PCR NEGATIVE NEGATIVE Final    Comment:        The GeneXpert MRSA Assay (FDA approved for NASAL specimens only), is one component of a comprehensive MRSA colonization surveillance program. It is not intended to diagnose MRSA infection nor to guide or monitor treatment for MRSA infections.   Urine culture     Status: None   Collection Time: 03/30/15 11:40 AM  Result Value Ref Range Status   Specimen Description URINE, RANDOM  Final   Special Requests NONE  Final   Culture MULTIPLE SPECIES PRESENT, SUGGEST RECOLLECTION  Final   Report Status 03/31/2015 FINAL  Final   Studies/Results: No results found.  Medications: I have reviewed the patient's current medications. Scheduled Meds: . sodium chloride   Intravenous Once  . sodium chloride   Intravenous Once  . antiseptic oral rinse  7 mL Mouth Rinse BID  . atorvastatin  20 mg Oral q1800  . [START ON 04/04/2015] darbepoetin (ARANESP) injection - DIALYSIS  150 mcg Intravenous Q Thu-HD  . DULoxetine  40 mg Oral Daily  . fluconazole  150 mg Oral Daily  . hydroxychloroquine  400 mg Oral Daily  . midodrine  5 mg Oral BID WC  . mineral oil-hydrophilic petrolatum   Topical BID  . multivitamin  1 tablet Oral QHS  . OLANZapine zydis  10 mg Oral BID PC  . pantoprazole  20 mg Oral Daily  . terbinafine   Topical BID  . 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  Maculopapular Rash: Her rash has been present for the last 3 days, and it is not painful, but is diffusely located on the abdomen, thighs, legs, upper arm and feet. The rash is maculopapular.  She seems to have coumadin induced confluent ecchymoses and also contact dermatitis like features on the upper part, and also the plantar feet.  She had just finished her certaz and finished vanc . Holding antibiotics right now.  Do not see any s/s of infections. Do not think this may be a lupus flare. We do not have dermatology to do skin biopsy but she can have that done when she is discharged where it is available.  -aquaphor cream  -lamisil cream -holding antibiotics -we will resume coumadin- INR is 5.4- HOLD the coumadin today   Sacral wound: ulcer which seems to have worsened and now showing full thickness skin loss -consulted wound care who is following -air bed -out of bed for few hours   Lupus nephritis, ESRD on TTSa HD through L thigh AVG  -dialysis today ([facility]) -Midodrine during HD -Continue plaquenil  H/o recurrent polymicrobial UTIs:  She completed her 14-day course of vancomycin and ceftazidime during HD  12/30. She has had history of recurrent UTIs and the cultures in the past have been resistant to a lot of antibiotics- including vancomycin. It had grown VRE, and also had candida and  klebsiella. She had a repeat UA done yesterday which showed large leuks and nitrites and was turbid. The urine culture is pending. The cultures in the past was sensitive to linezolid. However, given her current thrombocytopenia And given that she is afebrile and not systemically sick, we are holding any antibiotics.   -Hold abx for now  -urine culture suggest recollection but she has no urinary symptoms.    Major depression with psychotic features - no recurrent hallucinations or delusions since previous admission -resume olanzapine, c/w cymbalta _She seems more alert today   Deconditioning: -pt eval and treat   PPX - DVT: SCDs; coumadin per pharm  Disposition: SHe will be going to [facility]     The patient does have a current PCP Lin Landsman, MD) and does need an [facility] hospital follow-up appointment after discharge.  The patient does not have transportation limitations that hinder transportation to clinic appointments.  .Services Needed at time of discharge: Y = Yes, Blank = No PT:   OT:   RN:   Equipment:   Other:     LOS: 4 days   Burgess Estelle, MD 04/02/2015, 9:41 AM

## 2015-04-02 NOTE — Progress Notes (Signed)
SLP Cancellation Note  Patient Details Name: Jarah Ross MRN: [id number] DOB: Oct 21, 1975   Cancelled treatment:       Reason Eval/Treat Not Completed: Patient at procedure or test/unavailable.  Will follow up as able.   Gunnar Fusi, M.A., CCC-SLP [phone]  [place] 04/02/2015, 2:06 PM

## 2015-04-02 NOTE — Discharge Instructions (Signed)
Please see the discharge summary for full instructions.  Please hold the coumadin for the next 2 days, and resume per pharmacy dosing.

## 2015-04-02 NOTE — Progress Notes (Signed)
Initial Nutrition Assessment  DOCUMENTATION CODES:   Obesity unspecified  INTERVENTION:  Provide Boost Breeze po TID, each supplement provides 250 kcal and 9 grams of protein.  Provide 30 ml Prostat po BID, each supplement provides 100 kcal and 15 grams of protein.   Encourage adequate PO intake.   Plans for discharge today.   NUTRITION DIAGNOSIS:   Increased nutrient needs related to chronic illness as evidenced by estimated needs.  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  Meal completion has been 10-30%. Family at bedside reports po intake has been poor PTA. Pt reports trying to consume 3 meals a day, however portions consumed have been small. Usual body weight reported to be 174 lbs. Per Epic weight records, pt with a 27% weight loss over the past 4 months, which may be associated with fluid status. Pt is agreeable to nutritional supplements to aid in caloric and protein needs. RD to order. Pt was encouraged to eat her food at meals.   Nutrition-Focused physical exam completed. Findings are no fat depletion, no muscle depletion, and mild edema.   Labs and medications reviewed.   Diet Order:  DIET DYS 2 Room service appropriate?: Yes; Fluid consistency:: Thin  Skin:  Wound (see comment) (Stg II pressure ulcer on buttocks, wound on breast and  flank)  Last BM:  1/2  Height:   Ht Readings from Last 1 Encounters:  03/29/15 5\' 3"  (1.6 m)    Weight:   Wt Readings from Last 1 Encounters:  04/01/15 172 lb 13.5 oz (78.4 kg)    Ideal Body Weight:  52.27 kg  BMI:  Body mass index is 30.63 kg/(m^2).  Estimated Nutritional Needs:   Kcal:  1900-2100  Protein:  100-115 grams  Fluid:  Per MD  EDUCATION NEEDS:   No education needs identified at this time  Corrin Parker, MS, RD, LDN Pager # [phone] After hours/ weekend pager # [phone]

## 2015-04-02 NOTE — Progress Notes (Signed)
  [facility] Progress Note   Assessment/ Plan:   1. Skin Rash: Appears to be improving-felt to be consistent with drug-induced rash versus contact allergen. Features not consistent with SLE. 2. ESRD: Continue Tuesday/Thursday/Saturday dialysis schedule-plans in place for hemodialysis today. 3. Anemia: Hemoglobin levels relatively stable, continue to monitor trend for further PRBC needs. Continue ESA 4. CKD-MBD: Poor nutritional intake/low phosphorus -not on binders at this time  5. Nutrition: With low albumin likely secondary to decubitus ulcer/inflammatory complex. 6. Hypertension: With fair blood pressure control on midodrine 7. Deconditioning/sacral decubitus: Ongoing wound care for her sacral decubitus and with plans to discharge to skilled nursing facility later today for ongoing management of wounds/debilitation.  Subjective:   She reports that her rash feels better and she is looking forward to getting dialysis later today and leaving the hospital.    Objective:   BP 91/52 mmHg  Pulse 115  Temp(Src) 97.9 F (36.6 C) (Oral)  Resp 16  Ht 5\' 3"  (1.6 m)  Wt 78.4 kg (172 lb 13.5 oz)  BMI 30.63 kg/m2  SpO2 100%  LMP 03/18/2015  Physical Exam: Gen: Appears miserable resting in bed-currently on her side as she gets care for her sacral decubitus CVS: Pulse regular tachycardia, S1 and S2 with ESM Resp: Poor inspiratory effort-clear to auscultation Abd: Soft, obese, nontender Ext: No lower extremity edema. Left thigh graft Skin: Maculopapular rash with confluent areas of ecchymosis/erythema over her trunk/back as well as upper and lower extremities-appears relatively better compared to yesterday  Labs: BMET  Recent Labs Lab 03/29/15 1920 03/30/15 0524 03/31/15 1040  NA 134* 137 137  K 3.3* 2.4* 3.9  CL 95* 96* 98*  CO2 28 29 27   GLUCOSE 72 64* 80  BUN <5* 6 6  CREATININE 2.53* 2.71* 1.81*  CALCIUM 8.7* 9.2 9.1  PHOS  --   --  1.2*   CBC  Recent  Labs Lab 03/29/15 1920 03/30/15 0524 03/30/15 1710 03/31/15 1040 04/01/15 1130  WBC 8.6 3.4* 4.2 6.0 11.2*  NEUTROABS 7.4  --   --   --   --   HGB 7.7* 6.7* 7.5* 11.2* 10.8*  HCT 23.2* 21.3* 25.4* 34.7* 31.4*  MCV 95.9 96.4 95.5 92.3 90.2  PLT 112* 36* 45* 41* 63*   Medications:    . sodium chloride   Intravenous Once  . sodium chloride   Intravenous Once  . antiseptic oral rinse  7 mL Mouth Rinse BID  . atorvastatin  20 mg Oral q1800  . [START ON 04/04/2015] darbepoetin (ARANESP) injection - DIALYSIS  150 mcg Intravenous Q Thu-HD  . DULoxetine  40 mg Oral Daily  . fluconazole  150 mg Oral Daily  . hydroxychloroquine  400 mg Oral Daily  . midodrine  5 mg Oral BID WC  . mineral oil-hydrophilic petrolatum   Topical BID  . multivitamin  1 tablet Oral QHS  . OLANZapine zydis  10 mg Oral BID PC  . pantoprazole  20 mg Oral Daily  . terbinafine   Topical BID  . Warfarin - Pharmacist Dosing Inpatient   Does not apply [id number]   Elmarie Shiley, MD 04/02/2015, 10:30 AM

## 2015-04-03 LAB — CBC
HEMATOCRIT: 30.9 % — AB (ref 36.0–46.0)
HEMOGLOBIN: 10.1 g/dL — AB (ref 12.0–15.0)
MCH: 30.4 pg (ref 26.0–34.0)
MCHC: 32.7 g/dL (ref 30.0–36.0)
MCV: 93.1 fL (ref 78.0–100.0)
Platelets: 31 10*3/uL — ABNORMAL LOW (ref 150–400)
RBC: 3.32 MIL/uL — ABNORMAL LOW (ref 3.87–5.11)
RDW: 18.8 % — AB (ref 11.5–15.5)
WBC: 6 10*3/uL (ref 4.0–10.5)

## 2015-04-03 LAB — CULTURE, BLOOD (ROUTINE X 2): Culture: NO GROWTH

## 2015-04-03 LAB — PROTIME-INR
INR: 6.1 — AB (ref 0.00–1.49)
PROTHROMBIN TIME: 52.2 s — AB (ref 11.6–15.2)

## 2015-04-03 MED ORDER — SODIUM CHLORIDE 0.9 % IV SOLN: 125.0000 mg | INTRAVENOUS | Status: DC

## 2015-04-03 MED ORDER — K PHOS MONO-SOD PHOS DI & MONO 155-852-130 MG PO TABS
500.0000 mg | ORAL_TABLET | Freq: Two times a day (BID) | ORAL | Status: DC
  Administered 2015-04-03: 500 mg via ORAL
  Filled 2015-04-03 (×2): qty 2

## 2015-04-03 MED ORDER — VITAMIN K1 10 MG/ML IJ SOLN
2.5000 mg | Freq: Once | INTRAVENOUS | Status: AC
  Administered 2015-04-03: 2.5 mg via INTRAVENOUS
  Filled 2015-04-03: qty 0.25

## 2015-04-03 NOTE — Progress Notes (Addendum)
Report called [name] at [facility]. [name] aware of 6.1 INR today.  All questions answered.

## 2015-04-03 NOTE — Clinical Social Work Note (Signed)
CSW continuing to monitor patient's progress, as she is not medically stable for discharge today. CSW contacted [facility] facility liaison, Gayla Medicus and also informed patient's mother, Ms. Hilton at the hospital.   Tylor Gambrill Givens, MSW, [place] Licensed Clinical Social Worker [place] [phone]

## 2015-04-03 NOTE — Progress Notes (Signed)
Subjective:  Cos sore buttocks / Hd yest on schedule  Objective Vital signs in last 24 hours: Filed Vitals:   04/02/15 1531 04/02/15 1750 04/02/15 2033 04/03/15 0714  BP: 100/62 97/54 87/52    Pulse: 128 129 125   Temp: 98.6 F (37 C) 98.8 F (37.1 C) 97.8 F (36.6 C)   TempSrc: Oral Axillary Axillary   Resp: 28 26 24    Height:      Weight:      SpO2: 100% 100% 100% 92%   Weight change:   Physical Exam: General: soft spoken AAF, NAD Heart: Tachy reg no mur or rub Lungs: Poor inspiratory effort CTA Abdomen: obese, soft , nt, nd bs = Extremities: No pedal edema Dialysis Access: pos bruit L fem avgg   Problem/Plan: 1.  Supra therapeutic INR- per admit team rx for dc today 2. ESRD - on schedule TTS  [facility] 3. Skin rash- improving =drug reaction vs allergen  4. Anemia - hgb stable  esa as op  5. Secondary hyperparathyroidism - no binders with poor nutrion and phos 6. HTN/volume -  On midodrine / eval edw  7    Deconditioning/sacral decubitus: Ongoing wound care for her sacral decubitus and with plans to discharge to skilled nursing facility later today for ongoing management of wounds/debilitation  Ernest Haber, PA-C [place] [phone] 04/03/2015,9:35 AM  LOS: 5 days   Labs: Basic Metabolic Panel:  Recent Labs Lab 03/30/15 0524 03/31/15 1040 04/02/15 1219  NA 137 137 138  K 2.4* 3.9 3.7  CL 96* 98* 99*  CO2 29 27 23   GLUCOSE 64* 80 80  BUN 6 6 18   CREATININE 2.71* 1.81* 3.99*  CALCIUM 9.2 9.1 10.4*  PHOS  --  1.2* 1.3*   Liver Function Tests:  Recent Labs Lab 03/29/15 1920 03/30/15 0524 03/31/15 1040 04/02/15 1219  AST 49* 27  --   --   ALT 21 20  --   --   ALKPHOS 97 89  --   --   BILITOT 0.8 0.9  --   --   PROT 5.6* 5.7*  --   --   ALBUMIN 1.6* 1.9* 2.0* 1.6*   No results for input(s): LIPASE, AMYLASE in the last 168 hours. No results for input(s): AMMONIA in the last 168 hours. CBC:  Recent Labs Lab 03/29/15 1920   03/30/15 1710 03/31/15 1040 04/01/15 1130 04/02/15 1219 04/03/15 0810  WBC 8.6  < > 4.2 6.0 11.2* 7.0 6.0  NEUTROABS 7.4  --   --   --   --  6.3  --   HGB 7.7*  < > 7.5* 11.2* 10.8* 9.4* 10.1*  HCT 23.2*  < > 25.4* 34.7* 31.4* 29.0* 30.9*  MCV 95.9  < > 95.5 92.3 90.2 93.9 93.1  PLT 112*  < > 45* 41* 63* 37* 31*  < > = values in this interval not displayed. Cardiac Enzymes: No results for input(s): CKTOTAL, CKMB, CKMBINDEX, TROPONINI in the last 168 hours. CBG: No results for input(s): GLUCAP in the last 168 hours.  Studies/Results: No results found. Medications:   . sodium chloride   Intravenous Once  . sodium chloride   Intravenous Once  . antiseptic oral rinse  7 mL Mouth Rinse BID  . atorvastatin  20 mg Oral q1800  . [START ON 04/04/2015] darbepoetin (ARANESP) injection - DIALYSIS  150 mcg Intravenous Q Thu-HD  . DULoxetine  40 mg Oral Daily  . feeding supplement  1 Container Oral  TID BM  . feeding supplement (PRO-STAT SUGAR FREE 64)  30 mL Oral BID  . fluconazole  150 mg Oral Daily  . hydroxychloroquine  400 mg Oral Daily  . midodrine  5 mg Oral BID WC  . mineral oil-hydrophilic petrolatum   Topical BID  . multivitamin  1 tablet Oral QHS  . OLANZapine zydis  10 mg Oral BID PC  . pantoprazole  20 mg Oral Daily  . terbinafine   Topical BID  . Warfarin - Pharmacist Dosing Inpatient   Does not apply [phone]

## 2015-04-03 NOTE — Progress Notes (Signed)
CRITICAL VALUE ALERT  Critical value received:  PT 52.2 & INR 6.1  Date of notification:  04/03/15  Time of notification:  09:33  Critical value read back: yes  Nurse who received alert:  Cassell Smiles  MD notified (1st page):    Time of first page:  09:54  MD notified (2nd page):  Time of second page:10:04  Responding MD:  Dr. Marijean Bravo  Time MD responded:  10:05

## 2015-04-03 NOTE — Progress Notes (Signed)
Subjective:  Patient seen and examined at bedside. Patient is feeling well overall She is alert and oriented to person , and upon re-examination in the afternoon, she was alert and oriented to person, time and place.  Her INR was 6.1 so we gave her vitamin K.  She denies any f/c/n/v/d/c. No signs of bleeding. Her hemoglobin is stable.   Pt will be d/c today to [facility]   Objective: Vital signs in last 24 hours: Filed Vitals:   04/02/15 1531 04/02/15 1750 04/02/15 2033 04/03/15 0714  BP: 100/62 97/54 87/52    Pulse: 128 129 125   Temp: 98.6 F (37 C) 98.8 F (37.1 C) 97.8 F (36.6 C)   TempSrc: Oral Axillary Axillary   Resp: 28 26 24    Height:      Weight:      SpO2: 100% 100% 100% 92%   Weight change:   Intake/Output Summary (Last 24 hours) at 04/03/15 0841 Last data filed at 04/03/15 0616  Gross per 24 hour  Intake     60 ml  Output    517 ml  Net   -457 ml   Gen: alert and oriented to person, place, and time, no acute distress, more alert today as compared to yesterday CV: unlabored respirations  Abdominal: obese, non-tender, non-distended, without rebound, guarding, or masses Extremities: Warm and well perfused, 2+ edema of the left thigh around graft site, 1+ distal LLE edema. Bandages around L femoral graft  Neuro: CN II-XII intact grossly, no focal neurologic deficits Skin: maculopapular rash, ecchymoses, and scaling present, also non-blanching lesions.     Lab Results: reviewed  Micro Results: Recent Results (from the past 240 hour(s))  Blood Culture (routine x 2)     Status: None (Preliminary result)   Collection Time: 03/29/15  8:34 PM  Result Value Ref Range Status   Specimen Description BLOOD RIGHT HAND  Final   Special Requests BOTTLES DRAWN AEROBIC ONLY 5CC  Final   Culture NO GROWTH 4 DAYS  Final   Report Status PENDING  Incomplete  Blood Culture (routine x 2)     Status: None (Preliminary result)   Collection Time: 03/29/15  8:54 PM    Result Value Ref Range Status   Specimen Description BLOOD RIGHT HAND  Final   Special Requests BOTTLES DRAWN AEROBIC ONLY 5CC  Final   Culture NO GROWTH 3 DAYS  Final   Report Status PENDING  Incomplete  MRSA PCR Screening     Status: None   Collection Time: 03/29/15 11:51 PM  Result Value Ref Range Status   MRSA by PCR NEGATIVE NEGATIVE Final    Comment:        The GeneXpert MRSA Assay (FDA approved for NASAL specimens only), is one component of a comprehensive MRSA colonization surveillance program. It is not intended to diagnose MRSA infection nor to guide or monitor treatment for MRSA infections.   Urine culture     Status: None   Collection Time: 03/30/15 11:40 AM  Result Value Ref Range Status   Specimen Description URINE, RANDOM  Final   Special Requests NONE  Final   Culture MULTIPLE SPECIES PRESENT, SUGGEST RECOLLECTION  Final   Report Status 03/31/2015 FINAL  Final   Studies/Results: No results found. Medications: I have reviewed the patient's current medications. Scheduled Meds: . sodium chloride   Intravenous Once  . sodium chloride   Intravenous Once  . antiseptic oral rinse  7 mL Mouth Rinse BID  . atorvastatin  20 mg Oral q1800  . [START ON 04/04/2015] darbepoetin (ARANESP) injection - DIALYSIS  150 mcg Intravenous Q Thu-HD  . DULoxetine  40 mg Oral Daily  . feeding supplement  1 Container Oral TID BM  . feeding supplement (PRO-STAT SUGAR FREE 64)  30 mL Oral BID  . fluconazole  150 mg Oral Daily  . hydroxychloroquine  400 mg Oral Daily  . midodrine  5 mg Oral BID WC  . mineral oil-hydrophilic petrolatum   Topical BID  . multivitamin  1 tablet Oral QHS  . OLANZapine zydis  10 mg Oral BID PC  . pantoprazole  20 mg Oral Daily  . terbinafine   Topical BID  . 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 with a 3 day history of a painful scaling rash that started on her legs and progressed to her stomach and arms, as well as developing ecchymoses on her back and arms.  Supratherapeutic INR: INR of 6.1, despite holding the coumadin for 2 days.  -no signs of bleeding. Her hemoglobin is stable. Given 2.5 mg vitamin K.  -patient stable for discharge to [facility]   Maculopapular Rash: Her rash has been present for the last 3 days, and it is not painful, but is diffusely located on the abdomen, thighs, legs, upper arm and feet. The rash is maculopapular.  She seems to have coumadin induced confluent ecchymoses and also contact dermatitis like features on the upper part, and also the plantar feet.  She had just finished her certaz and finished vanc . Holding antibiotics right now.  Do not see any s/s of infections. Do not think this may be a lupus flare. We do not have dermatology to do skin biopsy but she can have that done when she is discharged where it is available.  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  The patient does not have transportation limitations that hinder transportation to clinic appointments.  .Services Needed at time of discharge: Y = Yes, Blank = No PT:   OT:   RN:   Equipment:   Other:     LOS: 5 days   Burgess Estelle, MD 04/03/2015, 8:41 AM

## 2015-04-03 NOTE — Progress Notes (Signed)
OT Cancellation Note  Patient Details Name: Fantasy Compitello MRN: [id number] DOB: Feb 27, 1976   Cancelled Treatment:    Reason Eval/Treat Not Completed: Medical issues which prohibited therapy (INR 6) Pt with INR 6 on coumadin, per therapy guidelines >4 INR reason to hold therapy at this time due to risk for bleeding.   Vonita Moss   OTR/L Pager: [phone] Office: [phone] .  04/03/2015, 10:54 AM

## 2015-04-03 NOTE — Discharge Summary (Signed)
Name: [name] MRN: [id number] DOB: [birth year] 40 y.o. PCP: Lin Landsman, MD  Date of Admission: 03/29/2015 5:00 PM Date of Discharge: 04/03/2015 Attending Physician: Dr. Dareen Piano   Discharge Diagnosis: 1. Lupus nephritis admitted for acute encephalopathy Active Problems:  Coumadin toxicity  ESRD on dialysis [facility])  Discharge Medications:   Medication List    STOP taking these medications       cefTAZidime 2 g in dextrose 5 % 50 mL     metoprolol succinate 25 MG 24 hr tablet  Commonly known as: TOPROL-XL     Vancomycin 750 MG/150ML Soln  Commonly known as: VANCOCIN     warfarin 2.5 MG tablet  Commonly known as: COUMADIN      TAKE these medications       atorvastatin 20 MG tablet  Commonly known as: LIPITOR  Take 1 tablet (20 mg total) by mouth daily at 6 PM.     cyclobenzaprine 10 MG tablet  Commonly known as: FLEXERIL  Take 1 tablet (10 mg total) by mouth at bedtime as needed for muscle spasms.     Darbepoetin Alfa 200 MCG/0.4ML Sosy injection  Commonly known as: ARANESP  Inject 0.4 mLs (200 mcg total) into the vein every Thursday with hemodialysis.     DULoxetine HCl 40 MG Cpep  Take 40 mg by mouth daily.     feeding supplement (PRO-STAT SUGAR FREE 64) Liqd  Take 30 mLs by mouth daily.     feeding supplement Liqd  Take 1 Container by mouth daily.     ferric gluconate 125 mg in sodium chloride 0.9 % 100 mL  Inject 125 mg into the vein Every Tuesday,Thursday,and Saturday with dialysis.     fluconazole 150 MG tablet  Commonly known as: DIFLUCAN  Take 1 tablet (150 mg total) by mouth daily.     hydroxychloroquine 200 MG tablet  Commonly known as: PLAQUENIL  Take 2 tablets (400 mg total) by mouth daily.     METOPROLOL TARTRATE PO  Take 25 mg by mouth daily. Take 0.5 tablet daily for hypertension     midodrine 5 MG tablet  Commonly known as: PROAMATINE  Take  1 tablet (5 mg total) by mouth 2 (two) times daily with a meal.     mineral oil-hydrophilic petrolatum ointment  Apply topically 2 (two) times daily.     multivitamin Tabs tablet  Take 1 tablet by mouth at bedtime.     OLANZapine zydis 10 MG disintegrating tablet  Commonly known as: ZYPREXA  Take 1 tablet (10 mg total) by mouth 2 (two) times daily after a meal.     oxyCODONE-acetaminophen 5-325 MG tablet  Commonly known as: PERCOCET/ROXICET  Take 1 tablet by mouth every 6 (six) hours as needed for severe pain.     pantoprazole 20 MG tablet  Commonly known as: PROTONIX  Take 1 tablet (20 mg total) by mouth daily.     polyethylene glycol packet  Commonly known as: MIRALAX / GLYCOLAX  Take 17 g by mouth daily as needed for mild constipation.     terbinafine 1 % cream  Commonly known as: LAMISIL  Apply topically 2 (two) times daily.     trimethobenzamide 300 MG capsule  Commonly known as: TIGAN  Take 1 capsule (300 mg total) by mouth every 8 (eight) hours as needed for nausea/vomiting.     TUMS PO  Take 1-2 tablets by mouth daily as needed (heartburn).        Disposition and follow-up:  [name] was discharged from [facility] in Stable condition. At the hospital follow up visit please address:  1. Supratherapeutic INR- her INR was 6.1 on discharge. We held her coumadin. Prior to discharge, patient also received 2.5 mg of vitamin K.  She will need to resume the coumadin at the appropriate dosing per pharmacy. She needs repeat INR check at the SNF  Sacral wounds- she needs thorough wound care as there is significant skin breakdown. At the SNF, she needs to be on an airbed with frequent re-positioning of the patient, and also thorough ambulation, and out of the bed.   Rehab: At [facility], she needs intensive rehabilitation. At the previous SNF she was only getting 20 minutes of rehab twice a  week, which is unacceptable.   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 Plaquenil needs to be continued  Maculopapular rash: she has mainly dry skin induced rash with some features of contact dermatitis and possibly coumadin induced ecchymoses. We prescribed her the creams which she needs to be applied.  Depression: continue with Cymbalta and olanzapine    2. Labs / imaging needed at time of follow-up: Daily INR/PT, CBC BMET   3. Pending labs/ test needing follow-up:   Follow-up Appointments:     Follow-up Information    Follow up with REESE,BETTI D, MD. Schedule an appointment as soon as possible for a visit in 3 weeks.   Specialty: Family Medicine   Contact information:   [id number] [place] [id number] [phone]       Discharge Instructions: Discharge Instructions    Diet - low sodium heart healthy  Complete by: As directed      Increase activity slowly  Complete by: As directed            Consultations: Treatment Team: Roney Jaffe, MD  Procedures Performed:   Imaging Results    Dg Chest 1 View  03/17/2015 CLINICAL DATA: Status post right-sided thoracentesis EXAM: CHEST 1 VIEW COMPARISON: 03/16/2015 FINDINGS: Cardiac shadow is enlarged. A right-sided thoracentesis has been performed with reduction in right-sided pleural effusion. No pneumothorax is seen. A stable left axillary stent is noted. No bony abnormality is seen. IMPRESSION: No pneumothorax following right thoracentesis. Electronically Signed By: Inez Catalina M.D. On: 03/17/2015 10:54   Dg Chest 1 View  03/11/2015 CLINICAL DATA: 40 year old female status post left-sided thoracentesis yielding bloody fluid. EXAM: CHEST 1 VIEW COMPARISON: Chest x-ray 03/08/2015. FINDINGS: Moderate bilateral pleural effusions (right greater than left) with associated bibasilar opacities which may reflect areas  of atelectasis and/or airspace consolidation no pneumothorax. Perihilar consolidation noted on the prior study appears improved on today's examination. No associated cephalization of the pulmonary vasculature. Heart size is upper limits of normal. Upper mediastinal contours are within normal limits. Atherosclerotic calcifications in the arch of the thoracic aorta. IMPRESSION: 1. No postprocedural pneumothorax. 2. Moderate bilateral pleural effusions (right greater than left) with atelectasis and/or consolidation throughout the lung bases bilaterally. 3. Decreasing perihilar airspace consolidation compared to prior examination. 4. Atherosclerosis. Electronically Signed By: Vinnie Langton M.D. On: 03/11/2015 15:27   Ct Head Wo Contrast  03/29/2015 CLINICAL DATA: Initial encounter for episodes of somnolence EXAM: CT HEAD WITHOUT CONTRAST TECHNIQUE: Contiguous axial images were obtained from the base of the skull through the vertex without intravenous contrast. COMPARISON: 02/22/2015. FINDINGS: Area of hypo attenuation in the posterior right frontal lobe is stable since the prior study and was characterized  on previous MRI as infarct. No evidence for new acute infarct on today's study. No evidence for acute hemorrhage, hydrocephalus, or mass lesion. No midline shift. No abnormal extra-axial fluid collection. The visualized paranasal sinuses and mastoid air cells are clear. IMPRESSION: Stable appearance of posterior right frontal encephalomalacia, in an area of known infarct. No new or progressive findings on today's exam. Electronically Signed By: Misty Stanley M.D. On: 03/29/2015 21:33   Ct Chest Wo Contrast  03/18/2015 CLINICAL DATA: Shortness of Breath EXAM: CT CHEST WITHOUT CONTRAST TECHNIQUE: Multidetector CT imaging of the chest was performed following the standard protocol without IV contrast. COMPARISON: Chest CT March 10, 2015; chest radiograph March 17, 2015 FINDINGS:  Mediastinum/Lymph Nodes: Visualized thyroid appears normal. There are multiple small mediastinal lymph nodes without frank adenopathy. There is a fairly small pericardial effusion. There are scattered foci of coronary artery calcification. There is no thoracic aortic aneurysm. Visualized great vessels appear unremarkable on this noncontrast enhanced study. Heart is mildly enlarged. Lungs/Pleura: There are persistent moderate pleural effusions bilaterally with bibasilar consolidation. The ground-glass opacity noted in the upper lobes on recent prior study has resolved. There is no new opacity in the lung parenchyma. On axial slice 25 series 3, there is a 4 mm nodular opacity in the lateral segment right middle lobe. Upper abdomen: No focal lesions are identified in the visualized upper abdomen region. Musculoskeletal: No blastic or lytic bone lesions. IMPRESSION: Moderate pleural effusions bilaterally with stable bibasilar consolidation. Some of this consolidation is new atelectatic in etiology, although there is concern for bibasilar pneumonia as well. The previously noted upper lobe ground-glass opacity has resolved bilaterally. 4 mm nodular opacity lateral segment right middle lobe. Followup of this nodular opacity should be based on Fleischner Society guidelines. If the patient is at high risk for bronchogenic carcinoma, follow-up chest CT at 1 year is recommended. If the patient is at low risk, no follow-up is needed. This recommendation follows the consensus statement: Guidelines for Management of Small Pulmonary Nodules Detected on CT Scans: A Statement from the [facility] as published in Radiology 2005; 237:395-400. Cardiomegaly with small pericardial effusion. Scattered foci of coronary artery calcification. Small mediastinal lymph nodes without adenopathy by size criteria. Electronically Signed By: Lowella Grip III M.D. On: 03/18/2015 18:48   Ct Chest Wo Contrast  03/10/2015 CLINICAL  DATA: Cough, pulmonary infiltrate, end-stage renal disease EXAM: CT CHEST WITHOUT CONTRAST TECHNIQUE: Multidetector CT imaging of the chest was performed following the standard protocol without IV contrast. COMPARISON: Images of the lung bases CT scan 01/14/2015 FINDINGS: Study is markedly limited by streak artifacts from patient's large body habitus. Limited study without IV contrast. Images of the thoracic inlet are unremarkable. Central airways are patent. Cardiomegaly is noted. No pericardial effusion. The visualized upper abdomen is unremarkable. No significant hilar adenopathy. There is moderate to large bilateral pleural effusion. There is significant atelectasis bilateral lower lobe posteriorly. Elevation of the right hemidiaphragm is noted. There is nodular mass or adenopathy in right upper lobe medially. Best seen in axial image 16 measures about 3.7 cm. There is nodular pleural thickening in right lower lobe. Pleural disease cannot be excluded. Further correlation with enhanced study and analysis of pleural fluid is recommended. There is central ground-glass attenuation bilateral upper lobe suspicious for pulmonary edema or less likely alveolar infiltrates. No destructive bony lesions are noted. Sagittal images of the spine shows degenerative changes thoracic spine. Fatty infiltration of the liver is noted. IMPRESSION: Markedly limited study without IV contrast. The  study is limited by patient's large body habitus. 1. Bilateral moderate to large size pleural effusion. Bilateral lower lobe significant atelectasis. There is nodular pleural thickening in right lower lobe laterally see axial image 24. Pleural disease or metastatic disease cannot be excluded. 2. There is nodular mass or adenopathy in right upper lobe medially measures at least 3.7 cm. Further correlation with enhanced study is recommended. Pleural fluid analysis also recommended 3. Cardiomegaly is noted. 4. Degenerative changes thoracic  spine. 5. There is central ground-glass attenuation bilateral upper lobes. Findings suspicious for pulmonary edema or less likely infiltrates. Electronically Signed By: Lahoma Crocker M.D. On: 03/10/2015 14:32   Dg Chest Portable 1 View  03/29/2015 CLINICAL DATA: Subsequent encounter for lupus with concern for sepsis of unknown source. EXAM: PORTABLE CHEST 1 VIEW COMPARISON: 03/17/2015. FINDINGS: 1838 hours. The cardio pericardial silhouette is enlarged. Bibasilar atelectasis/ pneumonia persists with some improvement in aeration at the left base. No evidence for overt airspace pulmonary edema. Imaged bony structures of the thorax are intact. Telemetry leads overlie the chest. IMPRESSION: Cardiomegaly with left greater than right basilar atelectasis or pneumonia. Aeration at the left base is improved in the interval. Electronically Signed By: Misty Stanley M.D. On: 03/29/2015 18:47   Dg Chest Port 1 View  03/16/2015 CLINICAL DATA: Fever, lupus, stroke, end-stage renal disease, TIA, hypertension, arrhythmias EXAM: PORTABLE CHEST 1 VIEW COMPARISON: Portable exam 1108 hours compared to 03/11/2015 FINDINGS: Rotation to the LEFT. Enlargement of cardiac silhouette with pulmonary vascular congestion. Mediastinal contours normal. Bibasilar effusions. Atelectasis versus consolidation LEFT lower lobe with minimal atelectasis at RIGHT base. Upper lungs clear. No pneumothorax. Bones unremarkable. IMPRESSION: Enlargement of cardiac silhouette with pulmonary vascular congestion. Bibasilar effusions with minimal RIGHT basilar atelectasis and more pronounced atelectasis versus consolidation in LEFT lower lobe. Electronically Signed By: Lavonia Dana M.D. On: 03/16/2015 11:29   Dg Chest Port 1 View  03/08/2015 CLINICAL DATA: Intermittent shortness of breath. Lupus. EXAM: PORTABLE CHEST 1 VIEW COMPARISON: 02/19/2015 FINDINGS: Perihilar airspace opacity and layering pleural effusions which are new. Chronic  cardiopericardial enlargement. Stable upper mediastinal contours. Left-sided venous stenting. IMPRESSION: Perihilar airspace disease and layering pleural effusions, new from 02/19/2015 and likely overload/CHF. Electronically Signed By: Monte Fantasia M.D. On: 03/08/2015 12:01   US Thoracentesis Asp Pleural Space W/img Guide  03/17/2015 INDICATION: Symptomatic right sided pleural effusion EXAM: US THORACENTESIS ASP PLEURAL SPACE W/IMG GUIDE COMPARISON: Thoracentesis 03/11/2015. MEDICATIONS: None COMPLICATIONS: None immediate TECHNIQUE: Informed written consent was obtained from the patient after a discussion of the risks, benefits and alternatives to treatment. A timeout was performed prior to the initiation of the procedure. Initial ultrasound scanning demonstrates a right loculated pleural effusion. The lower chest was prepped and draped in the usual sterile fashion. 1% lidocaine was used for local anesthesia. Under direct ultrasound guidance, a 19 gauge, 7-cm, Yueh catheter was introduced. An ultrasound image was saved for documentation purposes. The thoracentesis was performed. The catheter was removed and a dressing was applied. The patient tolerated the procedure well without immediate post procedural complication. The patient was escorted to have an upright chest radiograph. FINDINGS: A total of approximately 60 ml of dark bloody fluid was removed. Requested samples were sent to the laboratory. IMPRESSION: Successful ultrasound-guided right sided thoracentesis yielding 60 ml of pleural fluid. Read By: Tsosie Billing PA-C Electronically Signed By: Marybelle Killings M.D. On: 03/17/2015 10:32   US Thoracentesis Asp Pleural Space W/img Guide  03/11/2015 INDICATION: Symptomatic left sided pleural effusion EXAM: US THORACENTESIS ASP PLEURAL SPACE W/IMG GUIDE  COMPARISON: None. MEDICATIONS: 10 cc 1% lidocaine COMPLICATIONS: None immediate TECHNIQUE: Informed written consent was obtained from the  patient after a discussion of the risks, benefits and alternatives to treatment. A timeout was performed prior to the initiation of the procedure. Initial ultrasound scanning demonstrates a left pleural effusion. The lower chest was prepped and draped in the usual sterile fashion. 1% lidocaine was used for local anesthesia. Under direct ultrasound guidance, a 19 gauge, 7-cm, Yueh catheter was introduced. An ultrasound image was saved for documentation purposes. The thoracentesis was performed. The catheter was removed and a dressing was applied. The patient tolerated the procedure well without immediate post procedural complication. The patient was escorted to have an upright chest radiograph. FINDINGS: A total of approximately 300 cc of bloody fluid was removed. Requested samples were sent to the laboratory. IMPRESSION: Successful ultrasound-guided left sided thoracentesis yielding 300 cc of pleural fluid. Read by: Lavonia Drafts [facility] Electronically Signed By: Lucrezia Europe M.D. On: 03/11/2015 14:59     2D Echo:   Cardiac Cath:   Admission HPI:   Mrs. 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 She continues to have significant pain in her buttocks region, especially when sitting up for HD sessions although she is now able to sit up for all 4 hours. Her mom has tried putting moisturizing lotions on her legs and stomach but it only helps intermittently. Since yesterday, she has become increasingly fatigued as well. She says that occasionally, she gets short of breath  but this is improving since her previous admission. She denies fevers or chills, headache, vision changes, chest pain, palpitations, cough, nausea, vomiting, abdominal pain, any bowel symptoms, including melena or hematochezia, focal weakness or numbness, or other symptoms at this time.  Her mom called her rheumatologist, Dr. Charlestine Night, who had concern for a lupus flare with cutaneous manifestations. Her INR was checked at the SNF yesterday and was over 4. In the ED, she was also found to have blood-tinged stool although she denies noticing these previously, INR 5.7, Hgb down to 7.7 from 10.7 one week ago, so she was given FFP. CT head was also done given her h/o stroke, INR, and increased somnolence, which was negative for any changes from previous imaging.    Hospital Course by problem list: Active Problems:  Coumadin toxicity  ESRD on dialysis ([place])  Supratherapeutic INR: On discharge the INR was around 6.1.  We held her coumadin and gave her vitamin K 2.5 mg. Prior to discharge, her mental status was much improved, she exhibited no signs of bleeding.   Sacral wounds- 2 wounds were identified with significant skin breakdown. She needs thorough wound care  Acute Encephalopathy:possibly medication induced- we held the olanazapine but likely it was multifactorial as her hgb had also dropped. Her mental status improved considerably on her own. She was transfused 2 units of PRBC.  On discharge day, her mental status was much improved- she was alert and oriented to person, time and place.   Maculopapular Rash: Her rash has been present for the last 6  days, and it is not painful, but is diffusely located on the abdomen, thighs, legs, upper arm and feet. The rash is maculopapular. She seems to have coumadin induced confluent ecchymoses and also contact dermatitis like features on the upper part, and also the plantar feet. She had just finished her certaz and finished vanc . Holding antibiotics right  now. Do not see any s/s of infections. Do not think this may be a lupus flare. We do not have dermatology to do skin biopsy but she can have that done when she is discharged where it is available on an outpatient basis. We gave her aquaphor , lamisil, cream. Coumadin was resumed per pharmacy but then held on discharge due to supra therapeutic INR.   Lupus nephritis, ESRD on TTSa HD through L thigh AVG. Dialysis was continued during her hospitalization, Plaquenil was resumed.   H/o recurrent polymicrobial UTIs: She completed her 14-day course of vancomycin and ceftazidime during HD 12/30. She has had history of recurrent UTIs and the cultures in the past have been resistant to a lot of antibiotics- including vancomycin. It had grown VRE, and also had candida and klebsiella. She had a repeat UA done yesterday which showed large leuks and nitrites and was turbid. The urine culture suggested recollection, however as she is an ESRD patient, she makes very little urine so the urine cultures are not going to be of any help. The cultures in the past was sensitive to linezolid. However, given her current thrombocytopenia And given that she is afebrile and not systemically sick, we are holding any antibiotics.    HgB drop: 10.8-->--> 6.7 on 12/31 AM. She has had progressive fatigue. INR was 5.7. FOBT +. Plts are stable Possibly lower GI source. She was given 2 units of PRBC. Her hemoglobin had improved at discharge.   Major depression with psychotic features - no recurrent hallucinations or delusions since previous admission. We resumed her olanzapine, and continued cymbalta  Deconditioning: She was seen by PT.   PPX - DVT: SCDs; coumadin held   Discharge Vitals:  BP 90/64 mmHg  Pulse 125  Temp(Src) 98.2 F (36.8 C) (Oral)  Resp 22  Ht 5\' 3"  (1.6 m)  Wt 172 lb 13.5 oz (78.4 kg)  BMI 30.63 kg/m2  SpO2 100%  LMP 03/18/2015  Discharge Labs:   Lab Results Last 24 Hours    Results for orders  placed or performed during the hospital encounter of 03/29/15 (from the past 24 hour(s))  Protime-INR Status: Abnormal   Collection Time: 04/02/15 10:20 AM  Result Value Ref Range   Prothrombin Time 48.0 (H) 11.6 - 15.2 seconds   INR 5.46 (HH) 0.00 - 1.49  CBC with Differential/Platelet Status: Abnormal   Collection Time: 04/02/15 12:19 PM  Result Value Ref Range   WBC 7.0 4.0 - 10.5 K/uL   RBC 3.09 (L) 3.87 - 5.11 MIL/uL   Hemoglobin 9.4 (L) 12.0 - 15.0 g/dL   HCT 29.0 (L) 36.0 - 46.0 %   MCV 93.9 78.0 - 100.0 fL   MCH 30.4 26.0 - 34.0 pg   MCHC 32.4 30.0 - 36.0 g/dL   RDW 18.8 (H) 11.5 - 15.5 %   Platelets 37 (L) 150 - 400 K/uL   Neutrophils Relative % 90 %   Neutro Abs 6.3 1.7 - 7.7 K/uL   Lymphocytes Relative 7 %   Lymphs Abs 0.5 (L) 0.7 - 4.0 K/uL   Monocytes Relative 3 %   Monocytes Absolute 0.2 0.1 -  1.0 K/uL   Eosinophils Relative 0 %   Eosinophils Absolute 0.0 0.0 - 0.7 K/uL   Basophils Relative 0 %   Basophils Absolute 0.0 0.0 - 0.1 K/uL  Renal function panel Status: Abnormal   Collection Time: 04/02/15 12:19 PM  Result Value Ref Range   Sodium 138 135 - 145 mmol/L   Potassium 3.7 3.5 - 5.1 mmol/L   Chloride 99 (L) 101 - 111 mmol/L   CO2 23 22 - 32 mmol/L   Glucose, Bld 80 65 - 99 mg/dL   BUN 18 6 - 20 mg/dL   Creatinine, Ser 3.99 (H) 0.44 - 1.00 mg/dL   Calcium 10.4 (H) 8.9 - 10.3 mg/dL   Phosphorus 1.3 (L) 2.5 - 4.6 mg/dL   Albumin 1.6 (L) 3.5 - 5.0 g/dL   GFR calc non Af Amer 13 (L) >60 mL/min   GFR calc Af Amer 15 (L) >60 mL/min   Anion gap 16 (H) 5 - 15      Signed: Burgess Estelle, MD 04/02/2015, 1:42 PM    Services Ordered on Discharge:  Equipment Ordered on Discharge:

## 2015-04-04 ENCOUNTER — Non-Acute Institutional Stay (SKILLED_NURSING_FACILITY): Payer: Medicare Other | Admitting: Internal Medicine

## 2015-04-04 ENCOUNTER — Telehealth: Payer: Self-pay | Admitting: Internal Medicine

## 2015-04-04 ENCOUNTER — Encounter: Payer: Self-pay | Admitting: Internal Medicine

## 2015-04-04 DIAGNOSIS — F29 Unspecified psychosis not due to a substance or known physiological condition: Secondary | ICD-10-CM | POA: Diagnosis not present

## 2015-04-04 DIAGNOSIS — R234 Changes in skin texture: Secondary | ICD-10-CM

## 2015-04-04 DIAGNOSIS — N186 End stage renal disease: Secondary | ICD-10-CM

## 2015-04-04 DIAGNOSIS — T45511D Poisoning by anticoagulants, accidental (unintentional), subsequent encounter: Secondary | ICD-10-CM

## 2015-04-04 DIAGNOSIS — Z992 Dependence on renal dialysis: Secondary | ICD-10-CM

## 2015-04-04 DIAGNOSIS — I63511 Cerebral infarction due to unspecified occlusion or stenosis of right middle cerebral artery: Secondary | ICD-10-CM

## 2015-04-04 DIAGNOSIS — T604X1D Toxic effect of rodenticides, accidental (unintentional), subsequent encounter: Secondary | ICD-10-CM | POA: Diagnosis not present

## 2015-04-04 DIAGNOSIS — M3214 Glomerular disease in systemic lupus erythematosus: Secondary | ICD-10-CM

## 2015-04-04 DIAGNOSIS — G8194 Hemiplegia, unspecified affecting left nondominant side: Secondary | ICD-10-CM | POA: Diagnosis not present

## 2015-04-04 DIAGNOSIS — F329 Major depressive disorder, single episode, unspecified: Secondary | ICD-10-CM

## 2015-04-04 DIAGNOSIS — F32A Depression, unspecified: Secondary | ICD-10-CM

## 2015-04-04 DIAGNOSIS — K219 Gastro-esophageal reflux disease without esophagitis: Secondary | ICD-10-CM

## 2015-04-04 LAB — CULTURE, BLOOD (ROUTINE X 2): CULTURE: NO GROWTH

## 2015-04-04 NOTE — Telephone Encounter (Signed)
I spoke with Armandina Gemma Living [place]:  Ms Cathie Beams RN for Ms Hodson,  I explained her to stop the diflucan.

## 2015-04-04 NOTE — Progress Notes (Signed)
MRN: [id number] Name: [name]  Sex: female Age: 40 y.o. DOB: 28-Aug-1975  [place] #: Karren Burly Facility/Room: Level Of Care: SNF Provider: Inocencio Homes D Emergency Contacts: Extended Emergency Contact Information Primary Emergency Contact: Hilton,Denise Address: [place], [place] [address] [place] [employer] [place] Phone: [phone] Relation: Mother Secondary Emergency Contact: Glenetta Hew, [place] [employer] Phone: [phone] Relation: Sister  Code Status:   Allergies: Food  Chief Complaint  Patient presents with  . New Admit To SNF    HPI: Patient is 40 y.o. 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 Pt was admitted to [facility] from 12/30-04/03/2015 where she was found to have a supratheraputic INR, corrected and a maculopapular that was reported as mainly dry skin and ecchymosis and which was felt to be improving. Pt is admitted to SNF for profound generalized weakness. On admission it was seen that approx 80% of her skin area was ecchymotic and she had large areas of desquamation of skin from B buttocks, L leg, R thigh, R arm B,groin, under B breasts with large blebs forming before skin loss. This did not appear to be improvement as advertised and a call was made to the MD who had taken care of her.See below. While pt is at SNF she will be followed for depression, tx with cymbalta, psychosi, tx with zyprexa and GERD, tx with protonix.  Past Medical History  Diagnosis Date  .  Hypertension   . Cardiac arrhythmia   . SVT (supraventricular tachycardia) ([place])     "today, last week, 2 wk ago; maybe 1 month ago, etc; started w/in last 3-4 yrs"(07/20/2012)  . Fainting     "~ 1 month ago; probably related to SVT" (07/20/2012)  . Shortness of breath     "related to SVT episodes" (07/20/2012)  . Chest pain at rest     "related to SVT" (07/20/2012)  . Lupus (systemic lupus erythematosus) ([place])   . GERD (gastroesophageal reflux disease)   . Fibromyalgia   . Irritable bowel syndrome (IBS)   . Hypercholesterolemia   . Heart murmur     "small" (12/25/2014)  . TIA (transient ischemic attack) 12/21/2014  . Sleep apnea     "don't wear mask anymore" (12/25/2014)  . Anemia   . History of blood transfusion "several"    "related to low counts"  . Daily headache   . Arthritis     "hips" (12/25/2014)  . Anxiety     "sometimes; don't take anything for it" (12/25/2014)  . ESRD (end stage renal disease) on dialysis ([place]) since 12/07/2014    Diagnosed Aug 2014 w SLE nephritis DPGN by biopsy, rx cellcept/ steroids.  Repeat biopsy early 2016 membranous w/o activity so meds weaned off. Big flare Aug '16 creat 6, 3rd biopsy DPGN, meds resumed. Ended up starting HD Sept 2016  . Stroke ([place])   . History of vascular access device     2016: Sept 9  R IJ cath per IR.  Sept 20 not candidate for fistula due to disease veins, had LUA hybrid graft placed. Sept 21 - steal syndrome, LUA AVG ligated. Oct 7 - new left femoral Gore-tex loop graft per VVS. Oct 29 - removal R IJ tunneled HD cath    Past Surgical History  Procedure Laterality Date  . Cesarean section  2001; 2010  . Cervical biopsy  2013  . Supraventricular tachycardia ablation  07/21/12     Dr. Cristopher Peru   . Peripheral vascular catheterization N/A 12/14/2014    Procedure: Upper Extremity Venography;  Surgeon: Elam Dutch, MD;  Location: [facility];  Service: Cardiovascular;  Laterality: N/A;  . Insertion hybrid  anteriovenous gortex graft Left 12/18/2014    Procedure: INSERTION GORE HYBRID ARTERIOVENOUS GRAFT LEFT AXILLO-BRACHIAL.;  Surgeon: Serafina Mitchell, MD;  Location: [facility] OR;  Service: Vascular;  Laterality: Left;  . Ligation of arteriovenous  fistula Left 12/19/2014    Procedure: LIGATION OF FISTULA;  Surgeon: Elam Dutch, MD;  Location: [place];  Service: Vascular;  Laterality: Left;  . Appendectomy  2011  . Tubal ligation  2010  . Tee without cardioversion N/A 12/25/2014    Procedure: TRANSESOPHAGEAL ECHOCARDIOGRAM (TEE);  Surgeon: Sueanne Margarita, MD;  Location: [place];  Service: Cardiovascular;  Laterality: N/A;  . Av fistula placement Left 01/04/2015    Procedure: INSERTION OF ARTERIOVENOUS (AV) GORE-TEX GRAFT THIGH;  Surgeon: Rosetta Posner, MD;  Location: [place];  Service: Vascular;  Laterality: Left;  . Tee without cardioversion N/A 01/30/2015    Procedure: TRANSESOPHAGEAL ECHOCARDIOGRAM (TEE);  Surgeon: Lelon Perla, MD;  Location[facility] ENDOSCOPY;  Service: Cardiovascular;  Laterality: N/A;      Medication List       This list is accurate as of: 04/04/15 11:59 PM.  Always use your most recent med list.               atorvastatin 20 MG tablet  Commonly known as:  LIPITOR  Take 1 tablet (20 mg total) by mouth daily at 6 PM.     cyclobenzaprine 10 MG tablet  Commonly known as:  FLEXERIL  Take 1 tablet (10 mg total) by mouth at bedtime as needed for muscle spasms.     Darbepoetin Alfa 200 MCG/0.4ML Sosy injection  Commonly known as:  ARANESP  Inject 0.4 mLs (200 mcg total) into the vein every Thursday with hemodialysis.     DULoxetine HCl 40 MG Cpep  Take 40 mg by mouth daily.     feeding supplement (PRO-STAT SUGAR FREE 64) Liqd  Take 30 mLs by mouth daily.     feeding supplement Liqd  Take 1 Container by mouth daily.     ferric gluconate 125 mg in sodium chloride 0.9 % 100 mL  Inject 125 mg into the vein Every Tuesday,Thursday,and Saturday with dialysis.      fluconazole 150 MG tablet  Commonly known as:  DIFLUCAN  Take 1 tablet (150 mg total) by mouth daily.     hydroxychloroquine 200 MG tablet  Commonly known as:  PLAQUENIL  Take 2 tablets (400 mg total) by mouth daily.     METOPROLOL TARTRATE PO  Take 25 mg by mouth daily. Take 0.5 tablet daily for hypertension     midodrine 5 MG tablet  Commonly known as:  PROAMATINE  Take 1 tablet (5 mg total) by mouth 2 (two) times daily with a meal.     mineral oil-hydrophilic  petrolatum ointment  Apply topically 2 (two) times daily.     multivitamin Tabs tablet  Take 1 tablet by mouth at bedtime.     OLANZapine zydis 10 MG disintegrating tablet  Commonly known as:  ZYPREXA  Take 1 tablet (10 mg total) by mouth 2 (two) times daily after a meal.     oxyCODONE-acetaminophen 5-325 MG tablet  Commonly known as:  PERCOCET/ROXICET  Take 1 tablet by mouth every 6 (six) hours as needed for severe pain.     pantoprazole 20 MG tablet  Commonly known as:  PROTONIX  Take 1 tablet (20 mg total) by mouth daily.     polyethylene glycol packet  Commonly known as:  MIRALAX / GLYCOLAX  Take 17 g by mouth daily as needed for mild constipation.     terbinafine 1 % cream  Commonly known as:  LAMISIL  Apply topically 2 (two) times daily.     trimethobenzamide 300 MG capsule  Commonly known as:  TIGAN  Take 1 capsule (300 mg total) by mouth every 8 (eight) hours as needed for nausea/vomiting.     TUMS PO  Take 1-2 tablets by mouth daily as needed (heartburn).        No orders of the defined types were placed in this encounter.    Immunization History  Administered Date(s) Administered  . Tdap 06/08/2014    Social History  Substance Use Topics  . Smoking status: Never Smoker   . Smokeless tobacco: Never Used  . Alcohol Use: No     Comment: 07/20/2012 "might have a beer once/yr"    Family history is + breast CA   Review of Systems  DATA OBTAINED: from patient, nurse GENERAL:  no  fevers, + fatigue, poor appetite  SKIN: desquamating skin and ecchymosis EYES: No eye pain, redness, discharge EARS: No earache, tinnitus, change in hearing NOSE: No congestion, drainage or bleeding  MOUTH/THROAT: No mouth or tooth pain, No sore throat RESPIRATORY: No cough, wheezing, SOB CARDIAC: No chest pain, palpitations, lower extremity edema  GI: No abdominal pain, No N/V/D or constipation, No heartburn or reflux  GU: No dysuria, frequency or urgency, or incontinence  MUSCULOSKELETAL: No unrelieved bone/joint pain NEUROLOGIC: No headache, dizziness or focal weakness PSYCHIATRIC: No c/o anxiety or sadness   Filed Vitals:   04/04/15 2200  BP: 94/41  Pulse: 136  Temp: 97.2 F (36.2 C)  Resp: 20    SpO2 Readings from Last 1 Encounters:  04-15-2015 98%        Physical Exam  GENERAL APPEARANCE: Alert, non conversant, appears very uncomfortable.  SKIN: desquamated skin B buttocks approx 9 cm by 20 cm, large bleb L leg, large bleb R thigh that sheared when legs were separated, R arm ,B breastrs B groin involved with desquamation or blebs; shearing is thin thickness; approx 80% skin appears ecchymotic macules with confluence there is scaling to B soles and B palms are red HEAD: Normocephalic, atraumatic  EYES: Conjunctiva/lids clear. Pupils round, reactive. EOMs intact.  EARS: External exam WNL, canals clear. Hearing grossly normal.  NOSE: No deformity or discharge.  MOUTH/THROAT: Lips w/o lesions  RESPIRATORY: Breathing is even, unlabored. Lung sounds are clear   CARDIOVASCULAR: Heart RRR no murmurs, rubs or gallops. No peripheral edema.   GASTROINTESTINAL: Abdomen is soft, non-tender, not distended w/ normal bowel sounds. GENITOURINARY: Bladder non tender, not distended  MUSCULOSKELETAL: No abnormal joints or musculature NEUROLOGIC:  Cranial nerves 2-12 grossly intact. Moves all extremities  PSYCHIATRIC: pt appears  miserable but tolerating wound care  Patient Active  Problem List   Diagnosis Date Noted  . Desquamated skin 08-May-2015  . Depression 08-May-2015  . GERD (gastroesophageal reflux disease) 08-May-2015  . Acute respiratory failure ([place])   . HCAP (healthcare-associated pneumonia)   . Cardiac arrest ([place])   . Respiratory failure requiring intubation ([place]) 04/06/2015  . ESRD on dialysis ([place])   . Coumadin toxicity 03/29/2015  . Bacteremia   . Pleural effusion exudative   . Pleural effusion, bilateral   . HAP (hospital-acquired pneumonia)   . Thrombocytopenia ([place])   . Acute blood loss anemia   . Encephalopathy   . Psychosis 03/01/2015  . Pressure ulcer 02/28/2015  . Pain and swelling of left lower leg   . Knee pain   . Sepsis secondary to UTI ([place])   . Lower abdominal pain   . Acute encephalopathy 02/19/2015  . Systemic lupus ([place])   . Left hemiparesis ([place])   . Right middle cerebral artery stroke ([place]) 01/02/2015  . Steal syndrome as complication of dialysis access ([place])   . ESRD (end stage renal disease) on dialysis ([place])   . Lupus nephritis ([facility])     CBC    Component Value Date/Time   WBC 10.2 2015/05/08 0735   RBC 2.64* 05-08-15 0735   RBC 2.21* 03/04/2015 1005   HGB 8.1* May 08, 2015 0735   HCT 26.8* 08-May-2015 0735   PLT 50* May 08, 2015 0735   MCV 101.5* 05-08-2015 0735   LYMPHSABS 0.9 04/30/2015 2015   MONOABS 0.4 04/11/2015 2015   EOSABS 0.0 04/22/2015 2015   BASOSABS 0.1 04/29/2015 2015    CMP     Component Value Date/Time   NA 146* 05-08-2015 0735   K 5.6* 2015/05/08 0735   CL 105 05-08-15 0735   CO2 14* 2015/05/08 0735   GLUCOSE 45* 2015/05/08 0735   BUN 23* 08-May-2015 0735   CREATININE 4.15* 05/08/2015 0735   CALCIUM 8.3* May 08, 2015 0735   PROT 4.2* May 08, 2015 0735   ALBUMIN <1.0* May 08, 2015 0735   AST 258* 2015-05-08 0735   ALT 52 05/08/15 0735   ALKPHOS 170* 05-08-2015 0735   BILITOT 0.9 05/08/15 0735   GFRNONAA 13* May 08, 2015 0735   GFRAA 15* May 08, 2015 0735    Lab Results  Component  Value Date   HGBA1C 6.1* 12/22/2014     Ct Head Wo Contrast  03/29/2015  CLINICAL DATA:  Initial encounter for episodes of somnolence EXAM: CT HEAD WITHOUT CONTRAST TECHNIQUE: Contiguous axial images were obtained from the base of the skull through the vertex without intravenous contrast. COMPARISON:  02/22/2015. FINDINGS: Area of hypo attenuation in the posterior right frontal lobe is stable since the prior study and was characterized on previous MRI as infarct. No evidence for new acute infarct on today's study. No evidence for acute hemorrhage, hydrocephalus, or mass lesion. No midline shift. No abnormal extra-axial fluid collection. The visualized paranasal sinuses and mastoid air cells are clear. IMPRESSION: Stable appearance of posterior right frontal encephalomalacia, in an area of known infarct. No new or progressive findings on today's exam. Electronically Signed   By: Misty Stanley M.D.   On: 03/29/2015 21:33   Dg Chest Portable 1 View  03/29/2015  CLINICAL DATA:  Subsequent encounter for lupus with concern for sepsis of unknown source. EXAM: PORTABLE CHEST 1 VIEW COMPARISON:  03/17/2015. FINDINGS: 1838 hours. The cardio pericardial silhouette is enlarged. Bibasilar atelectasis/ pneumonia persists with some improvement in aeration at the left base. No evidence for overt  airspace pulmonary edema. Imaged bony structures of the thorax are intact. Telemetry leads overlie the chest. IMPRESSION: Cardiomegaly with left greater than right basilar atelectasis or pneumonia. Aeration at the left base is improved in the interval. Electronically Signed   By: Misty Stanley M.D.   On: 03/29/2015 18:47    Not all labs, radiology exams or other studies done during hospitalization come through on my EPIC note; however they are reviewed by me.    Assessment and Plan  Desquamated skin "she has mainly dry skin induced rash with some features of contact dermatitis and possibly coumadin induced ecchymoses. We  prescribed her the creams which she needs to be applied." SNF- this was description of skin on d/c to SNF yesterday, not what I'm seeing today. Whatever is going on with skin isn't improving and I is worsening. At SNF multiple transfers will be necessary for care and dialysis 3 times a week, all painful. I spoke with dialysis, the MD was there and made her aware, then spoke with Dr Dareen Piano who had taken care of pt this last hospitalization and made him aware of the situation today.Marland Kitchen His concern was pt picking up a worse infection in the hospital. He told me they had not been able to get a Dermatologist to see her and she had no insurance for LTAC although they were working on it. I understand his position, he understood mine. We will try to care for her at SNF. Realistically, it won't be long before she is in the hospital again.  Coumadin toxicity her INR was 6.1 on discharge. We held her coumadin. Prior to discharge, patient also received 2.5 mg of vitamin K.SNF - repeat INR's will be done and coumadin actively titrated   ESRD (end stage renal disease) on dialysis [facility]) SNF - cont TTHSat dialysis;today trying to get all wounds dressed in order to get her to dialysis  Lupus nephritis ([place]) SNF - onset 2 years ago, treated aggressively but ESRD onset summer 2016 with downhill trajectory since then  Left hemiparesis ([facility]) SNF 2/2 CVA, 2/2 nephritis ; SNF - actively titrate coumadin as prophylaxis  Right middle cerebral artery stroke ([facility]) SNF = 2/2 lupus nephritis; will be actively titrating coumadin  Psychosis SNF - appears stable;will cont zyprexa  Depression SNF - chronic , ongoing;cont cymbalta  GERD (gastroesophageal reflux disease) SNF - Not reported as unstable, cont protonix   Time spent > 60 min;> 50% of time with patient was spent reviewing records, labs, tests and studies, counseling and developing plan of care  Hennie Duos, MD

## 2015-04-07 ENCOUNTER — Emergency Department (HOSPITAL_COMMUNITY): Payer: Medicare Other

## 2015-04-07 ENCOUNTER — Inpatient Hospital Stay (HOSPITAL_COMMUNITY)
Admission: EM | Admit: 2015-04-07 | Discharge: 2015-05-01 | DRG: 871 | Disposition: E | Payer: Medicare Other | Attending: Emergency Medicine | Admitting: Emergency Medicine

## 2015-04-07 ENCOUNTER — Encounter (HOSPITAL_COMMUNITY): Payer: Self-pay

## 2015-04-07 DIAGNOSIS — E875 Hyperkalemia: Secondary | ICD-10-CM | POA: Diagnosis not present

## 2015-04-07 DIAGNOSIS — D696 Thrombocytopenia, unspecified: Secondary | ICD-10-CM | POA: Diagnosis present

## 2015-04-07 DIAGNOSIS — I38 Endocarditis, valve unspecified: Secondary | ICD-10-CM | POA: Diagnosis present

## 2015-04-07 DIAGNOSIS — J9601 Acute respiratory failure with hypoxia: Secondary | ICD-10-CM

## 2015-04-07 DIAGNOSIS — J9690 Respiratory failure, unspecified, unspecified whether with hypoxia or hypercapnia: Secondary | ICD-10-CM | POA: Diagnosis present

## 2015-04-07 DIAGNOSIS — I120 Hypertensive chronic kidney disease with stage 5 chronic kidney disease or end stage renal disease: Secondary | ICD-10-CM | POA: Diagnosis present

## 2015-04-07 DIAGNOSIS — I462 Cardiac arrest due to underlying cardiac condition: Secondary | ICD-10-CM | POA: Diagnosis not present

## 2015-04-07 DIAGNOSIS — N186 End stage renal disease: Secondary | ICD-10-CM | POA: Diagnosis present

## 2015-04-07 DIAGNOSIS — R6521 Severe sepsis with septic shock: Secondary | ICD-10-CM | POA: Diagnosis present

## 2015-04-07 DIAGNOSIS — Z515 Encounter for palliative care: Secondary | ICD-10-CM | POA: Diagnosis present

## 2015-04-07 DIAGNOSIS — G4730 Sleep apnea, unspecified: Secondary | ICD-10-CM | POA: Diagnosis present

## 2015-04-07 DIAGNOSIS — M797 Fibromyalgia: Secondary | ICD-10-CM | POA: Diagnosis present

## 2015-04-07 DIAGNOSIS — E872 Acidosis, unspecified: Secondary | ICD-10-CM

## 2015-04-07 DIAGNOSIS — I469 Cardiac arrest, cause unspecified: Secondary | ICD-10-CM | POA: Insufficient documentation

## 2015-04-07 DIAGNOSIS — R4182 Altered mental status, unspecified: Secondary | ICD-10-CM

## 2015-04-07 DIAGNOSIS — Z992 Dependence on renal dialysis: Secondary | ICD-10-CM

## 2015-04-07 DIAGNOSIS — D539 Nutritional anemia, unspecified: Secondary | ICD-10-CM | POA: Diagnosis present

## 2015-04-07 DIAGNOSIS — I4901 Ventricular fibrillation: Secondary | ICD-10-CM | POA: Diagnosis not present

## 2015-04-07 DIAGNOSIS — F323 Major depressive disorder, single episode, severe with psychotic features: Secondary | ICD-10-CM | POA: Diagnosis present

## 2015-04-07 DIAGNOSIS — E8770 Fluid overload, unspecified: Secondary | ICD-10-CM | POA: Diagnosis present

## 2015-04-07 DIAGNOSIS — N390 Urinary tract infection, site not specified: Secondary | ICD-10-CM | POA: Diagnosis present

## 2015-04-07 DIAGNOSIS — G9340 Encephalopathy, unspecified: Secondary | ICD-10-CM | POA: Diagnosis present

## 2015-04-07 DIAGNOSIS — A419 Sepsis, unspecified organism: Principal | ICD-10-CM | POA: Diagnosis present

## 2015-04-07 DIAGNOSIS — J189 Pneumonia, unspecified organism: Secondary | ICD-10-CM | POA: Diagnosis present

## 2015-04-07 DIAGNOSIS — Z7901 Long term (current) use of anticoagulants: Secondary | ICD-10-CM | POA: Diagnosis not present

## 2015-04-07 DIAGNOSIS — Z8673 Personal history of transient ischemic attack (TIA), and cerebral infarction without residual deficits: Secondary | ICD-10-CM

## 2015-04-07 DIAGNOSIS — F419 Anxiety disorder, unspecified: Secondary | ICD-10-CM | POA: Diagnosis present

## 2015-04-07 DIAGNOSIS — A499 Bacterial infection, unspecified: Secondary | ICD-10-CM

## 2015-04-07 DIAGNOSIS — J9600 Acute respiratory failure, unspecified whether with hypoxia or hypercapnia: Secondary | ICD-10-CM | POA: Diagnosis present

## 2015-04-07 DIAGNOSIS — M3214 Glomerular disease in systemic lupus erythematosus: Secondary | ICD-10-CM | POA: Diagnosis present

## 2015-04-07 DIAGNOSIS — J90 Pleural effusion, not elsewhere classified: Secondary | ICD-10-CM | POA: Diagnosis present

## 2015-04-07 DIAGNOSIS — E162 Hypoglycemia, unspecified: Secondary | ICD-10-CM | POA: Diagnosis present

## 2015-04-07 DIAGNOSIS — Y95 Nosocomial condition: Secondary | ICD-10-CM | POA: Diagnosis present

## 2015-04-07 DIAGNOSIS — L89159 Pressure ulcer of sacral region, unspecified stage: Secondary | ICD-10-CM | POA: Diagnosis present

## 2015-04-07 DIAGNOSIS — K219 Gastro-esophageal reflux disease without esophagitis: Secondary | ICD-10-CM | POA: Diagnosis present

## 2015-04-07 LAB — PROTIME-INR
INR: 2.16 — ABNORMAL HIGH (ref 0.00–1.49)
Prothrombin Time: 23.9 seconds — ABNORMAL HIGH (ref 11.6–15.2)

## 2015-04-07 LAB — URINALYSIS, ROUTINE W REFLEX MICROSCOPIC
GLUCOSE, UA: NEGATIVE mg/dL
KETONES UR: 15 mg/dL — AB
NITRITE: POSITIVE — AB
PH: 6.5 (ref 5.0–8.0)
Protein, ur: >300 mg/dL — AB
SPECIFIC GRAVITY, URINE: 1.015 (ref 1.005–1.030)

## 2015-04-07 LAB — I-STAT CHEM 8, ED
BUN: 23 mg/dL — AB (ref 6–20)
CHLORIDE: 103 mmol/L (ref 101–111)
CREATININE: 4.3 mg/dL — AB (ref 0.44–1.00)
Calcium, Ion: 1.02 mmol/L — ABNORMAL LOW (ref 1.12–1.23)
Glucose, Bld: 83 mg/dL (ref 65–99)
HEMATOCRIT: 29 % — AB (ref 36.0–46.0)
Hemoglobin: 9.9 g/dL — ABNORMAL LOW (ref 12.0–15.0)
POTASSIUM: 3.8 mmol/L (ref 3.5–5.1)
SODIUM: 141 mmol/L (ref 135–145)
TCO2: 16 mmol/L (ref 0–100)

## 2015-04-07 LAB — COMPREHENSIVE METABOLIC PANEL
ALBUMIN: 1.4 g/dL — AB (ref 3.5–5.0)
ALK PHOS: 178 U/L — AB (ref 38–126)
ALT: 36 U/L (ref 14–54)
AST: 60 U/L — AB (ref 15–41)
Anion gap: 26 — ABNORMAL HIGH (ref 5–15)
BILIRUBIN TOTAL: 1.3 mg/dL — AB (ref 0.3–1.2)
BUN: 23 mg/dL — AB (ref 6–20)
CALCIUM: 8.9 mg/dL (ref 8.9–10.3)
CO2: 16 mmol/L — AB (ref 22–32)
Chloride: 100 mmol/L — ABNORMAL LOW (ref 101–111)
Creatinine, Ser: 4.68 mg/dL — ABNORMAL HIGH (ref 0.44–1.00)
GFR calc Af Amer: 13 mL/min — ABNORMAL LOW (ref >60)
GFR calc non Af Amer: 11 mL/min — ABNORMAL LOW (ref >60)
GLUCOSE: 89 mg/dL (ref 65–99)
POTASSIUM: 4.1 mmol/L (ref 3.5–5.1)
SODIUM: 142 mmol/L (ref 135–145)
TOTAL PROTEIN: 5.5 g/dL — AB (ref 6.5–8.1)

## 2015-04-07 LAB — CBC WITH DIFFERENTIAL/PLATELET
BASOS ABS: 0.1 10*3/uL (ref 0.0–0.1)
BASOS PCT: 1 %
Eosinophils Absolute: 0 10*3/uL (ref 0.0–0.7)
Eosinophils Relative: 0 %
HEMATOCRIT: 28.9 % — AB (ref 36.0–46.0)
HEMOGLOBIN: 8.8 g/dL — AB (ref 12.0–15.0)
LYMPHS ABS: 0.9 10*3/uL (ref 0.7–4.0)
LYMPHS PCT: 12 %
MCH: 30 pg (ref 26.0–34.0)
MCHC: 30.4 g/dL (ref 30.0–36.0)
MCV: 98.6 fL (ref 78.0–100.0)
MONOS PCT: 6 %
Monocytes Absolute: 0.4 10*3/uL (ref 0.1–1.0)
NEUTROS ABS: 5.9 10*3/uL (ref 1.7–7.7)
Neutrophils Relative %: 81 %
Platelets: 41 10*3/uL — ABNORMAL LOW (ref 150–400)
RBC: 2.93 MIL/uL — ABNORMAL LOW (ref 3.87–5.11)
RDW: 19.2 % — AB (ref 11.5–15.5)
WBC: 7.3 10*3/uL (ref 4.0–10.5)

## 2015-04-07 LAB — I-STAT TROPONIN, ED: Troponin i, poc: 0.05 ng/mL (ref 0.00–0.08)

## 2015-04-07 LAB — CBG MONITORING, ED: GLUCOSE-CAPILLARY: 136 mg/dL — AB (ref 65–99)

## 2015-04-07 LAB — I-STAT CG4 LACTIC ACID, ED: Lactic Acid, Venous: 12.02 mmol/L (ref 0.5–2.0)

## 2015-04-07 LAB — URINE MICROSCOPIC-ADD ON

## 2015-04-07 LAB — CG4 I-STAT (LACTIC ACID): Lactic Acid, Venous: 9.81 mmol/L (ref 0.5–2.0)

## 2015-04-07 MED ORDER — SODIUM CHLORIDE 0.9 % IV SOLN: 250.0000 mL | INTRAVENOUS | Status: DC | PRN

## 2015-04-07 MED ORDER — VANCOMYCIN HCL IN DEXTROSE 750-5 MG/150ML-% IV SOLN: 750.0000 mg | INTRAVENOUS | Status: DC

## 2015-04-07 MED ORDER — MIDAZOLAM HCL 5 MG/5ML IJ SOLN
INTRAMUSCULAR | Status: AC | PRN
  Administered 2015-04-07: 3 mg via INTRAVENOUS

## 2015-04-07 MED ORDER — SODIUM CHLORIDE 0.9 % IV SOLN
INTRAVENOUS | Status: AC | PRN
  Administered 2015-04-07: 1000 mL via INTRAVENOUS

## 2015-04-07 MED ORDER — MIDAZOLAM HCL 2 MG/2ML IJ SOLN
2.0000 mg | INTRAMUSCULAR | Status: DC | PRN
  Administered 2015-04-07 – 2015-04-08 (×3): 2 mg via INTRAVENOUS
  Filled 2015-04-07 (×3): qty 2

## 2015-04-07 MED ORDER — PIPERACILLIN-TAZOBACTAM 3.375 G IVPB 30 MIN
3.3750 g | Freq: Once | INTRAVENOUS | Status: AC
  Administered 2015-04-07: 3.375 g via INTRAVENOUS
  Filled 2015-04-07: qty 50

## 2015-04-07 MED ORDER — MIDAZOLAM HCL 2 MG/2ML IJ SOLN
INTRAMUSCULAR | Status: AC
  Filled 2015-04-07: qty 4

## 2015-04-07 MED ORDER — VANCOMYCIN HCL 500 MG IV SOLR
500.0000 mg | Freq: Once | INTRAVENOUS | Status: AC
  Administered 2015-04-08: 500 mg via INTRAVENOUS
  Filled 2015-04-07: qty 500

## 2015-04-07 MED ORDER — VANCOMYCIN HCL IN DEXTROSE 1-5 GM/200ML-% IV SOLN
1000.0000 mg | Freq: Once | INTRAVENOUS | Status: AC
  Administered 2015-04-07: 1000 mg via INTRAVENOUS
  Filled 2015-04-07: qty 200

## 2015-04-07 MED ORDER — SODIUM CHLORIDE 0.9 % IV BOLUS (SEPSIS)
1000.0000 mL | Freq: Once | INTRAVENOUS | Status: AC
  Administered 2015-04-07: 1000 mL via INTRAVENOUS

## 2015-04-07 MED ORDER — NOREPINEPHRINE BITARTRATE 1 MG/ML IV SOLN
2.0000 ug/min | INTRAVENOUS | Status: DC
  Administered 2015-04-07: 5 ug/min via INTRAVENOUS
  Administered 2015-04-08: 7 ug/min via INTRAVENOUS
  Filled 2015-04-07: qty 4

## 2015-04-07 MED ORDER — FENTANYL CITRATE (PF) 100 MCG/2ML IJ SOLN
50.0000 ug | INTRAMUSCULAR | Status: DC | PRN
  Administered 2015-04-07 – 2015-04-08 (×3): 50 ug via INTRAVENOUS
  Filled 2015-04-07 (×3): qty 2

## 2015-04-07 MED ORDER — PIPERACILLIN-TAZOBACTAM IN DEX 2-0.25 GM/50ML IV SOLN
2.2500 g | Freq: Three times a day (TID) | INTRAVENOUS | Status: DC
Start: 2015-04-08 — End: 2015-04-08
  Administered 2015-04-08: 2.25 g via INTRAVENOUS
  Filled 2015-04-07 (×3): qty 50

## 2015-04-07 MED ORDER — PANTOPRAZOLE SODIUM 40 MG PO PACK
40.0000 mg | PACK | Freq: Every day | ORAL | Status: DC
  Filled 2015-04-07: qty 20

## 2015-04-07 MED ORDER — HYDROCORTISONE NA SUCCINATE PF 100 MG IJ SOLR
100.0000 mg | Freq: Three times a day (TID) | INTRAMUSCULAR | Status: DC
  Administered 2015-04-08 (×2): 100 mg via INTRAVENOUS
  Filled 2015-04-07 (×5): qty 2

## 2015-04-07 NOTE — Procedures (Signed)
Intubation Procedure Note [name] [id number] Jul 29, 1975  Procedure: Intubation Indications: Airway protection and maintenance  Procedure Details Consent: Risks of procedure as well as the alternatives and risks of each were explained to the (patient/caregiver).  Consent for procedure obtained. Time Out: Verified patient identification, verified procedure, site/side was marked, verified correct patient position, special equipment/implants available, medications/allergies/relevent history reviewed, required imaging and test results available.  Performed  Maximum sterile technique was used including antiseptics.  MAC    Evaluation Hemodynamic Status: BP stable throughout; O2 sats: stable throughout Patient's Current Condition: stable Complications: No apparent complications Patient did tolerate procedure well. Chest X-ray ordered to verify placement.  CXR: pending.   Chriss Driver [facility] 04/07/2015

## 2015-04-07 NOTE — ED Notes (Signed)
CC Stephens MD at bedside.

## 2015-04-07 NOTE — ED Notes (Signed)
Setting up for central line placement, per dr Jearld Pies.

## 2015-04-07 NOTE — ED Provider Notes (Signed)
CSN: [id number]     Arrival date & time 04/12/2015  1921 History   First MD Initiated Contact with Patient 04/20/2015 1944     No chief complaint on file.    (Consider location/radiation/quality/duration/timing/severity/associated sxs/prior Treatment) HPI Comments: Level of 5 exception of care due to acuity of condition  Patient is a 40 y.o. female presenting with altered mental status. The history is provided by the EMS personnel.  Altered Mental Status Presenting symptoms: unresponsiveness   Severity:  Severe Most recent episode:  Today Episode history:  Single Timing:  Constant Progression:  Worsening Chronicity:  New Context: nursing home resident and recent illness   Context: not head injury and not a recent change in medication   Associated symptoms: no fever, no seizures and no vomiting     Past Medical History  Diagnosis Date  . Hypertension   . Cardiac arrhythmia   . SVT (supraventricular tachycardia) ([place])     "today, last week, 2 wk ago; maybe 1 month ago, etc; started w/in last 3-4 yrs"(07/20/2012)  . Fainting     "~ 1 month ago; probably related to SVT" (07/20/2012)  . Shortness of breath     "related to SVT episodes" (07/20/2012)  . Chest pain at rest     "related to SVT" (07/20/2012)  . Lupus (systemic lupus erythematosus) ([place])   . GERD (gastroesophageal reflux disease)   . Fibromyalgia   . Irritable bowel syndrome (IBS)   . Hypercholesterolemia   . Heart murmur     "small" (12/25/2014)  . TIA (transient ischemic attack) 12/21/2014  . Sleep apnea     "don't wear mask anymore" (12/25/2014)  . Anemia   . History of blood transfusion "several"    "related to low counts"  . Daily headache   . Arthritis     "hips" (12/25/2014)  . Anxiety     "sometimes; don't take anything for it" (12/25/2014)  . 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  Repeat biopsy early 2016  membranous w/o activity so meds weaned off. Big flare Aug '16 creat 6, 3rd biopsy DPGN, meds resumed. Ended up starting HD Sept 2016  . Stroke ([place])   . History of vascular access device     2016: Sept 9  R IJ cath per IR.  Sept 20 not candidate for fistula due to disease veins, had LUA hybrid graft placed. Sept 21 - steal syndrome, LUA AVG ligated. Oct 7 - new left femoral Gore-tex loop graft per [facility]. Oct 29 - removal R IJ tunneled HD cath   Past Surgical History  Procedure Laterality Date  . Cesarean section  2001; 2010  . Cervical biopsy  2013  . Supraventricular tachycardia ablation  07/21/12     Dr. Cristopher Peru   . Peripheral vascular catheterization N/A 12/14/2014    Procedure: Upper Extremity Venography;  Surgeon: Elam Dutch, MD;  Location: [facility];  Service: Cardiovascular;  Laterality: N/A;  . Insertion hybrid anteriovenous gortex graft Left 12/18/2014    Procedure: INSERTION GORE HYBRID ARTERIOVENOUS GRAFT LEFT AXILLO-BRACHIAL.;  Surgeon: Serafina Mitchell, MD;  Location: [facility] OR;  Service: Vascular;  Laterality: Left;  . Ligation of arteriovenous  fistula Left 12/19/2014    Procedure: LIGATION OF FISTULA;  Surgeon: Elam Dutch, MD;  Location: [place];  Service: Vascular;  Laterality: Left;  . Appendectomy  2011  . Tubal  ligation  2010  . Tee without cardioversion N/A 12/25/2014    Procedure: TRANSESOPHAGEAL ECHOCARDIOGRAM (TEE);  Surgeon: Sueanne Margarita, MD;  Location: [place];  Service: Cardiovascular;  Laterality: N/A;  . Av fistula placement Left 01/04/2015    Procedure: INSERTION OF ARTERIOVENOUS (AV) GORE-TEX GRAFT THIGH;  Surgeon: Rosetta Posner, MD;  Location: [facility];  Service: Vascular;  Laterality: Left;  . Tee without cardioversion N/A 01/30/2015    Procedure: TRANSESOPHAGEAL ECHOCARDIOGRAM (TEE);  Surgeon: Lelon Perla, MD;  Location: [facility];  Service: Cardiovascular;  Laterality: N/A;   Family History  Problem Relation Age of Onset  . Cancer  Mother     breast and ovarian  . BRCA 1/2 Sister    Social History  Substance Use Topics  . Smoking status: Never Smoker   . Smokeless tobacco: Never Used  . Alcohol Use: No     Comment: 07/20/2012 "might have a beer once/yr"   OB History    Gravida Para Term Preterm AB TAB SAB Ectopic Multiple Living   2 2 2  0 0 0 0 0 0 2     Review of Systems  Unable to perform ROS: Patient unresponsive  Constitutional: Negative for fever.  Gastrointestinal: Negative for vomiting.  Neurological: Negative for seizures.      Allergies  Food  Home Medications   Prior to Admission medications   Medication Sig Start Date End Date Taking? Authorizing Provider  Amino Acids-Protein Hydrolys (FEEDING SUPPLEMENT, PRO-STAT SUGAR FREE 64,) LIQD Take 30 mLs by mouth daily. 03/22/15  Yes Jule Ser, DO  atorvastatin (LIPITOR) 20 MG tablet Take 1 tablet (20 mg total) by mouth daily at 6 PM. 01/17/15  Yes Lavon Paganini Angiulli, PA-C  Calcium Carbonate Antacid (TUMS PO) Take 1-2 tablets by mouth daily as needed (heartburn).   Yes Historical Provider, MD  cyclobenzaprine (FLEXERIL) 10 MG tablet Take 1 tablet (10 mg total) by mouth at bedtime as needed for muscle spasms. 01/17/15  Yes Daniel J Angiulli, PA-C  DULoxetine 40 MG CPEP Take 40 mg by mouth daily. 03/22/15  Yes Jule Ser, DO  hydroxychloroquine (PLAQUENIL) 200 MG tablet Take 2 tablets (400 mg total) by mouth daily. 01/17/15  Yes Daniel J Angiulli, PA-C  metoprolol tartrate (LOPRESSOR) 25 MG tablet Take 12.5 mg by mouth every morning.   Yes Historical Provider, MD  midodrine (PROAMATINE) 5 MG tablet Take 1 tablet (5 mg total) by mouth 2 (two) times daily with a meal. 03/22/15  Yes Jule Ser, DO  morphine (ROXANOL) 20 MG/ML concentrated solution Take 5 mg by mouth every 4 (four) hours as needed for severe pain.   Yes Historical Provider, MD  Multiple Vitamin (MULTIVITAMIN WITH MINERALS) TABS tablet Take 1 tablet by mouth daily.   Yes  Historical Provider, MD  OLANZapine zydis (ZYPREXA) 10 MG disintegrating tablet Take 1 tablet (10 mg total) by mouth 2 (two) times daily after a meal. 03/22/15  Yes Jule Ser, DO  oxyCODONE-acetaminophen (PERCOCET/ROXICET) 5-325 MG tablet Take 1 tablet by mouth every 6 (six) hours as needed for severe pain. 03/22/15  Yes Jule Ser, DO  pantoprazole (PROTONIX) 20 MG tablet Take 1 tablet (20 mg total) by mouth daily. 01/17/15  Yes Daniel J Angiulli, PA-C  polyethylene glycol (MIRALAX / GLYCOLAX) packet Take 17 g by mouth daily as needed for mild constipation. 02/04/15  Yes Collier Salina, MD  trimethobenzamide Sherre Poot) 300 MG capsule Take 1 capsule (300 mg total) by mouth every 8 (eight) hours as  needed for nausea/vomiting. 02/04/15  Yes Collier Salina, MD   BP 115/74 mmHg  Pulse 116  Temp(Src) 96.1 F (35.6 C)  Resp 26  Ht 5' 3"  (1.6 m)  Wt 77.111 kg  BMI 30.12 kg/m2  SpO2 100%  LMP 03/18/2015 Physical Exam  Constitutional: She appears well-developed and well-nourished. She has a sickly appearance. She appears ill.  HENT:  Head: Head is without abrasion, without contusion and without laceration.  Mouth/Throat: Mucous membranes are dry.  Eyes: Conjunctivae are normal. Pupils are equal, round, and reactive to light.  Neck: Normal range of motion.  Cardiovascular: Tachycardia present.  Exam reveals no decreased pulses.   No murmur heard. Pulmonary/Chest: Tachypnea noted. She is in respiratory distress. She has rhonchi in the right lower field and the left lower field.  Abdominal: Normal appearance. She exhibits no distension and no mass. There is no tenderness. There is no rigidity, no rebound, no guarding and no CVA tenderness.  Neurological: She is unresponsive. GCS eye subscore is 1. GCS verbal subscore is 1. GCS motor subscore is 1.  Skin: Rash (Pt has extensive blistering rash over entire body) noted.    ED Course  .Intubation Date/Time: 04/16/2015 9:04 PM Performed  by: Esaw Grandchild Authorized by: Esaw Grandchild Consent: The procedure was performed in an emergent situation. Patient identity confirmed: arm band Time out: Immediately prior to procedure a "time out" was called to verify the correct patient, procedure, equipment, support staff and site/side marked as required. Indications: respiratory distress and  airway protection Intubation method: video-assisted Patient status: unconscious Preoxygenation: BVM Laryngoscope size: Mac 3 Tube size: 7.5 mm Tube type: cuffed Number of attempts: 1 Cords visualized: yes Post-procedure assessment: chest rise and CO2 detector Breath sounds: equal Cuff inflated: yes ETT to lip: 21 cm Tube secured with: ETT holder Chest x-ray interpreted by me. Chest x-ray findings: endotracheal tube too high Tube repositioned: tube repositioned successfully Patient tolerance: Patient tolerated the procedure well with no immediate complications  .Critical Care Performed by: Esaw Grandchild Authorized by: Esaw Grandchild Total critical care time: 50 minutes Critical care was necessary to treat or prevent imminent or life-threatening deterioration of the following conditions: respiratory failure and CNS failure or compromise. Critical care was time spent personally by me on the following activities: blood draw for specimens, development of treatment plan with patient or surrogate, discussions with consultants, interpretation of cardiac output measurements, evaluation of patient's response to treatment, examination of patient, obtaining history from patient or surrogate, ordering and performing treatments and interventions, ordering and review of laboratory studies, ordering and review of radiographic studies, pulse oximetry, re-evaluation of patient's condition, review of old charts and ventilator management.  Linus Salmons Line Date/Time: 04/13/2015 9:06 PM Performed by: Esaw Grandchild Authorized by: Esaw Grandchild Consent:  The procedure was performed in an emergent situation. Patient identity confirmed: arm band Time out: Immediately prior to procedure a "time out" was called to verify the correct patient, procedure, equipment, support staff and site/side marked as required. Indications: vascular access Patient sedated: no Preparation: skin prepped with ChloraPrep Skin prep agent dried: skin prep agent completely dried prior to procedure Sterile barriers: all five maximum sterile barriers used - cap, mask, sterile gown, sterile gloves, and large sterile sheet Hand hygiene: hand hygiene performed prior to central venous catheter insertion Location details: right femoral Site selection rationale: emergent and patient is on dialysis Patient position: flat Catheter type: triple lumen Pre-procedure: landmarks identified Ultrasound guidance: yes Sterile ultrasound techniques: sterile gel and sterile probe  covers were used Number of attempts: 1 Successful placement: yes Post-procedure: line sutured and dressing applied Assessment: blood return through all ports Patient tolerance: Patient tolerated the procedure well with no immediate complications   (including critical care time) Labs Review Labs Reviewed  COMPREHENSIVE METABOLIC PANEL - Abnormal; Notable for the following:    Chloride 100 (*)    CO2 16 (*)    BUN 23 (*)    Creatinine, Ser 4.68 (*)    Total Protein 5.5 (*)    Albumin 1.4 (*)    AST 60 (*)    Alkaline Phosphatase 178 (*)    Total Bilirubin 1.3 (*)    GFR calc non Af Amer 11 (*)    GFR calc Af Amer 13 (*)    Anion gap 26 (*)    All other components within normal limits  CBC WITH DIFFERENTIAL/PLATELET - Abnormal; Notable for the following:    RBC 2.93 (*)    Hemoglobin 8.8 (*)    HCT 28.9 (*)    RDW 19.2 (*)    Platelets 41 (*)    All other components within normal limits  URINALYSIS, ROUTINE W REFLEX MICROSCOPIC (NOT AT [facility]) - Abnormal; Notable for the following:    Color, Urine  RED (*)    APPearance TURBID (*)    Hgb urine dipstick LARGE (*)    Bilirubin Urine SMALL (*)    Ketones, ur 15 (*)    Protein, ur >300 (*)    Nitrite POSITIVE (*)    Leukocytes, UA LARGE (*)    All other components within normal limits  PROTIME-INR - Abnormal; Notable for the following:    Prothrombin Time 23.9 (*)    INR 2.16 (*)    All other components within normal limits  URINE MICROSCOPIC-ADD ON - Abnormal; Notable for the following:    Squamous Epithelial / LPF 0-5 (*)    Bacteria, UA MANY (*)    All other components within normal limits  I-STAT CG4 LACTIC ACID, ED - Abnormal; Notable for the following:    Lactic Acid, Venous 12.02 (*)    All other components within normal limits  I-STAT CHEM 8, ED - Abnormal; Notable for the following:    BUN 23 (*)    Creatinine, Ser 4.30 (*)    Calcium, Ion 1.02 (*)    Hemoglobin 9.9 (*)    HCT 29.0 (*)    All other components within normal limits  CBG MONITORING, ED - Abnormal; Notable for the following:    Glucose-Capillary 136 (*)    All other components within normal limits  CULTURE, BLOOD (ROUTINE X 2)  CULTURE, BLOOD (ROUTINE X 2)  URINE CULTURE  I-STAT TROPOININ, ED  I-STAT CG4 LACTIC ACID, ED    Imaging Review Ct Head Wo Contrast  04/20/2015  CLINICAL DATA:  Unresponsive since 6 a.m., missed dialysis EXAM: CT HEAD WITHOUT CONTRAST TECHNIQUE: Contiguous axial images were obtained from the base of the skull through the vertex without intravenous contrast. COMPARISON:  03/29/2015 FINDINGS: Diffuse atrophy and low attenuation in the deep white matter. No hydrocephalus. More focal low attenuation posterior right frontal lobe consistent with encephalomalacia from known prior infarct. There is no hemorrhage or extra-axial fluid. Calvarium intact. Diffuse osteosclerosis of the skull consistent with renal failure. IMPRESSION: Chronic involutional change and prior infarct with no acute findings Electronically Signed   By: Skipper Cliche  M.D.   On: 04/17/2015 21:18   Dg Chest Port 1 View  04/22/2015  CLINICAL DATA:  Sepsis.  Assess central line placement. EXAM: PORTABLE CHEST 1 VIEW COMPARISON:  Chest radiograph March 29, 2015 FINDINGS: Endotracheal tube tip projects 3.9 cm above the carina. Nasogastric tube tip projects in proximal stomach with side port at GE junction region. The cardiac silhouette is mild to moderately enlarged, similar. Mediastinal silhouette is nonsuspicious. Pulmonary vascular congestion with alveolar airspace opacities RIGHT midlung zone, LEFT lung base. Small pleural effusions. No pneumothorax. LEFT subclavian stent. Osseous structures are normal. IMPRESSION: Endotracheal tube tip projects 3.9 cm above the carina. Nasogastric tube tip projects in proximal stomach with side port at GE junction region. Stable cardiomegaly and pulmonary vascular congestion. RIGHT midlung zone and LEFT lung base consolidation could represent confluent edema or pneumonia. Electronically Signed   By: Elon Alas M.D.   On: 04/22/2015 21:07   I have personally reviewed and evaluated these images and lab results as part of my medical decision-making.   EKG Interpretation None      MDM   Final diagnoses:  Altered mental status, unspecified altered mental status type  UTI (urinary tract infection), bacterial  CAP (community acquired pneumonia)  Lactic acid acidosis  Acute respiratory failure, unspecified whether with hypoxia or hypercapnia ([place])    40 year old black female with complicated medical history presents the setting of unresponsiveness. Per EMS report patient found at nursing facility altered and nonresponsive. On arrival EMS patient had hypoglycemia and given glucagon with significant improvement in glucose but no change in mental status. Patient actually breathing and noted to have diffuse rash over entire body. Patient does have a history of end-stage renal disease and lupus nephritis but did miss her last  dialysis session. Patient on Coumadin and reportedly received dialysis to the left thigh fistula.  On arrival patient was minimally responsive and actually breathing. At that time it was determined she needs to be intubated for airway protection. Patient intubated without competition. Please see note above. Patient was difficult to access due to end-stage renal disease and became hypotensive. At that time patient had a femoral triple lumen placed without competitions. Patient found to have lactic acidosis and with hypotension and lactic acidosis patient was thought to be septic at this time. Patient started on broad-spectrum antibiotics and given 30 mL per KG of IV fluids. Urinalysis concerning for urinary tract infection and chest x-ray concerning for pneumonia. Patient's INR in therapeutic range. CT head obtained without significant abnormality. This is thought to be likely altered mental status secondary to sepsis from either urinary tract infection or HCAP. Due to continued hypertension patient started on levophed.  Critical care team was consult did and patient will be admitted to critical care for further management. Patient stable at time of admission.  Attending has seen and evaluated patient and Dr. Lita Mains is in agreement with plan.    Esaw Grandchild, MD 2015/04/19 0002  Julianne Rice, MD 04/09/15 1622

## 2015-04-07 NOTE — ED Notes (Addendum)
[facility] ? Unresponsive since 6pm. Ems had cbg of 38 and after glucagon and d50 cbg 136 on arrival no change in mental status. Pt has i/o placed to left tib fib with ems, resp with ems 32 and assisted on arrival with bvm. ls noted rales all over with ems. On arrival to ed pt remains unresponsive, intubated on arrival. PT NOTED TO have sloughing skin disorder. Patient did miss dialysis yesterda

## 2015-04-07 NOTE — Progress Notes (Signed)
ANTIBIOTIC CONSULT NOTE - INITIAL  Pharmacy Consult for Vancomycin/Zosyn  Indication: rule out sepsis   Assessment: 40 y/o F with ESRD on HD TTS, multiple recent admissions, back today from nursing facility with altered mental status, CXR with right mid and left base consolidation. WBC WNL. Lactic acid is markedly elevated.   Goal of Therapy:  Pre-HD vancomycin level 15-25 mg/L  Plan:  -Vancomycin 1500 mg IV load, then 750 mg IV qHD TTS -Zosyn 2.25g IV q8h -Trend WBC, temp, HD schedule -Drug levels as indicated   Allergies  Allergen Reactions  . Food Swelling    Red peppers    Patient Measurements: Height: 5\' 3"  (160 cm) Weight: 170 lb (77.111 kg) IBW/kg (Calculated) : 52.4  Vital Signs: Temp: 96.4 F (35.8 C) (01/08 2330) BP: 109/76 mmHg (01/08 2330) Pulse Rate: 116 (01/08 2245) Intake/Output from previous day:   Intake/Output from this shift: Total I/O In: 2000 [I.V.:2000] Out: -   Labs:  Recent Labs  04/15/2015 2015 04/12/2015 2023  WBC 7.3  --   HGB 8.8* 9.9*  PLT 41*  --   CREATININE 4.68* 4.30*     Microbiology: Recent Results (from the past 720 hour(s))  MRSA PCR Screening     Status: None   Collection Time: 03/09/15  7:48 PM  Result Value Ref Range Status   MRSA by PCR NEGATIVE NEGATIVE Final    Comment:        The GeneXpert MRSA Assay (FDA approved for NASAL specimens only), is one component of a comprehensive MRSA colonization surveillance program. It is not intended to diagnose MRSA infection nor to guide or monitor treatment for MRSA infections.   Culture, body fluid-bottle     Status: None   Collection Time: 03/11/15  2:40 PM  Result Value Ref Range Status   Specimen Description FLUID LEFT PLEURAL  Final   Special Requests NONE  Final   Culture NO GROWTH 5 DAYS  Final   Report Status 03/16/2015 FINAL  Final  Gram stain     Status: None   Collection Time: 03/11/15  2:40 PM  Result Value Ref Range Status   Specimen Description  FLUID LEFT PLEURAL  Final   Special Requests NONE  Final   Gram Stain   Final    MODERATE WBC PRESENT,BOTH PMN AND MONONUCLEAR NO ORGANISMS SEEN    Report Status 03/11/2015 FINAL  Final  Culture, blood (routine x 2)     Status: None   Collection Time: 03/16/15 10:45 AM  Result Value Ref Range Status   Specimen Description BLOOD RIGHT HAND  Final   Special Requests IN PEDIATRIC BOTTLE 0.5CC  Final   Culture  Setup Time   Final    GRAM POSITIVE COCCI IN CLUSTERS AEROBIC BOTTLE ONLY CRITICAL RESULT CALLED TO, READ BACK BY AND VERIFIED WITH: J ZHU 03/17/15 @ 0730 M VESTAL    Culture STAPHYLOCOCCUS SPECIES (COAGULASE NEGATIVE)  Final   Report Status 03/19/2015 FINAL  Final   Organism ID, Bacteria STAPHYLOCOCCUS SPECIES (COAGULASE NEGATIVE)  Final      Susceptibility   Staphylococcus species (coagulase negative) - MIC*    CIPROFLOXACIN >=8 RESISTANT Resistant     ERYTHROMYCIN >=8 RESISTANT Resistant     GENTAMICIN >=16 RESISTANT Resistant     OXACILLIN >=4 RESISTANT Resistant     TETRACYCLINE >=16 RESISTANT Resistant     VANCOMYCIN 1 SENSITIVE Sensitive     TRIMETH/SULFA 160 RESISTANT Resistant     CLINDAMYCIN >=8 RESISTANT Resistant  RIFAMPIN <=0.5 SENSITIVE Sensitive     Inducible Clindamycin NEGATIVE Sensitive     * STAPHYLOCOCCUS SPECIES (COAGULASE NEGATIVE)  Culture, blood (routine x 2)     Status: None   Collection Time: 03/16/15 11:25 AM  Result Value Ref Range Status   Specimen Description BLOOD RIGHT FOREARM  Final   Special Requests BOTTLES DRAWN AEROBIC AND ANAEROBIC 5CC  Final   Culture  Setup Time   Final    GRAM POSITIVE COCCI IN CLUSTERS ANAEROBIC BOTTLE ONLY CRITICAL RESULT CALLED TO, READ BACK BY AND VERIFIED WITH: S LEE 03/17/15 @ 60 M VESTAL    Culture   Final    STAPHYLOCOCCUS SPECIES (COAGULASE NEGATIVE) SUSCEPTIBILITIES PERFORMED ON PREVIOUS CULTURE WITHIN THE LAST 5 DAYS.    Report Status 03/19/2015 FINAL  Final  Gram stain     Status: None    Collection Time: 03/17/15 10:27 AM  Result Value Ref Range Status   Specimen Description FLUID PLEURAL RIGHT  Final   Special Requests NONE  Final   Gram Stain   Final    RARE WBC PRESENT, PREDOMINANTLY MONONUCLEAR NO ORGANISMS SEEN    Report Status 03/17/2015 FINAL  Final  AFB culture with smear     Status: None (Preliminary result)   Collection Time: 03/17/15 10:27 AM  Result Value Ref Range Status   Specimen Description FLUID PLEURAL RIGHT  Final   Special Requests NONE  Final   Acid Fast Smear   Final    NO ACID FAST BACILLI SEEN Performed at [facility]    Culture   Final    CULTURE WILL BE EXAMINED FOR 6 WEEKS BEFORE ISSUING A FINAL REPORT Performed at [facility]    Report Status PENDING  Incomplete  Culture, body fluid-bottle     Status: None   Collection Time: 03/17/15 10:27 AM  Result Value Ref Range Status   Specimen Description FLUID PLEURAL RIGHT  Final   Special Requests NONE  Final   Culture NO GROWTH 5 DAYS  Final   Report Status 03/22/2015 FINAL  Final  Culture, Urine     Status: None   Collection Time: 03/17/15 11:21 AM  Result Value Ref Range Status   Specimen Description URINE, CATHETERIZED  Final   Special Requests NONE  Final   Culture   Final    >=100,000 COLONIES/mL CANDIDA ALBICANS 50,000 COLONIES/mL VANCOMYCIN RESISTANT ENTEROCOCCUS ISOLATED 2,000 COLONIES/mL KLEBSIELLA OXYTOCA Confirmed Extended Spectrum Beta-Lactamase Producer (ESBL)    Report Status 03/22/2015 FINAL  Final   Organism ID, Bacteria VANCOMYCIN RESISTANT ENTEROCOCCUS ISOLATED  Final   Organism ID, Bacteria KLEBSIELLA OXYTOCA  Final      Susceptibility   Klebsiella oxytoca - MIC*    AMPICILLIN >=32 RESISTANT Resistant     CEFAZOLIN >=64 RESISTANT Resistant     CEFTRIAXONE 8 RESISTANT Resistant     CIPROFLOXACIN 2 INTERMEDIATE Intermediate     GENTAMICIN <=1 SENSITIVE Sensitive     IMIPENEM <=0.25 SENSITIVE Sensitive     NITROFURANTOIN 32 SENSITIVE Sensitive      TRIMETH/SULFA <=20 SENSITIVE Sensitive     AMPICILLIN/SULBACTAM >=32 RESISTANT Resistant     PIP/TAZO >=128 RESISTANT Resistant     * 2,000 COLONIES/mL KLEBSIELLA OXYTOCA   Vancomycin resistant enterococcus isolated - MIC*    AMPICILLIN <=2 SENSITIVE Sensitive     LEVOFLOXACIN >=8 RESISTANT Resistant     NITROFURANTOIN <=16 SENSITIVE Sensitive     VANCOMYCIN >=32 RESISTANT Resistant     LINEZOLID 2 SENSITIVE Sensitive     *  50,000 COLONIES/mL VANCOMYCIN RESISTANT ENTEROCOCCUS ISOLATED  Blood culture (routine x 2)     Status: None   Collection Time: 03/18/15 12:39 PM  Result Value Ref Range Status   Specimen Description BLOOD RIGHT HAND  Final   Special Requests IN PEDIATRIC BOTTLE 1MLS  Final   Culture NO GROWTH 5 DAYS  Final   Report Status 03/23/2015 FINAL  Final  Blood culture (routine x 2)     Status: None   Collection Time: 03/18/15  1:02 PM  Result Value Ref Range Status   Specimen Description BLOOD RIGHT HAND  Final   Special Requests IN PEDIATRIC BOTTLE 3CCS  Final   Culture NO GROWTH 5 DAYS  Final   Report Status 03/23/2015 FINAL  Final  Culture, Urine     Status: None   Collection Time: 03/18/15  3:23 PM  Result Value Ref Range Status   Specimen Description URINE, RANDOM  Final   Special Requests vancomycin, ceftazidime Immunocompromised  Final   Culture   Final    >=100,000 COLONIES/mL CANDIDA ALBICANS 20,000 COLONIES/mL VANCOMYCIN RESISTANT ENTEROCOCCUS SUSCEPTIBILITIES PERFORMED ON PREVIOUS CULTURE WITHIN THE LAST 5 DAYS.    Report Status 03/23/2015 FINAL  Final   Organism ID, Bacteria VANCOMYCIN RESISTANT ENTEROCOCCUS  Final      Susceptibility   Vancomycin resistant enterococcus - MIC*    AMPICILLIN <=2 RESISTANT Resistant     LEVOFLOXACIN >=8 RESISTANT Resistant     NITROFURANTOIN <=16 SENSITIVE Sensitive     VANCOMYCIN >=32 RESISTANT Resistant     LINEZOLID 2 SENSITIVE Sensitive     * 20,000 COLONIES/mL VANCOMYCIN RESISTANT ENTEROCOCCUS  Blood  Culture (routine x 2)     Status: None   Collection Time: 03/29/15  8:34 PM  Result Value Ref Range Status   Specimen Description BLOOD RIGHT HAND  Final   Special Requests BOTTLES DRAWN AEROBIC ONLY 5CC  Final   Culture NO GROWTH 5 DAYS  Final   Report Status 04/03/2015 FINAL  Final  Blood Culture (routine x 2)     Status: None   Collection Time: 03/29/15  8:54 PM  Result Value Ref Range Status   Specimen Description BLOOD RIGHT HAND  Final   Special Requests BOTTLES DRAWN AEROBIC ONLY 5CC  Final   Culture NO GROWTH 5 DAYS  Final   Report Status 04/04/2015 FINAL  Final  MRSA PCR Screening     Status: None   Collection Time: 03/29/15 11:51 PM  Result Value Ref Range Status   MRSA by PCR NEGATIVE NEGATIVE Final    Comment:        The GeneXpert MRSA Assay (FDA approved for NASAL specimens only), is one component of a comprehensive MRSA colonization surveillance program. It is not intended to diagnose MRSA infection nor to guide or monitor treatment for MRSA infections.   Urine culture     Status: None   Collection Time: 03/30/15 11:40 AM  Result Value Ref Range Status   Specimen Description URINE, RANDOM  Final   Special Requests NONE  Final   Culture MULTIPLE SPECIES PRESENT, SUGGEST RECOLLECTION  Final   Report Status 03/31/2015 FINAL  Final    Medical History: Past Medical History  Diagnosis Date  . Hypertension   . Cardiac arrhythmia   . SVT (supraventricular tachycardia) ([place])     "today, last week, 2 wk ago; maybe 1 month ago, etc; started w/in last 3-4 yrs"(07/20/2012)  . Fainting     "~ 1 month ago; probably related to  SVT" (07/20/2012)  . Shortness of breath     "related to SVT episodes" (07/20/2012)  . Chest pain at rest     "related to SVT" (07/20/2012)  . Lupus (systemic lupus erythematosus) ([place])   . GERD (gastroesophageal reflux disease)   . Fibromyalgia   . Irritable bowel syndrome (IBS)   . Hypercholesterolemia   . Heart murmur     "small"  (12/25/2014)  . TIA (transient ischemic attack) 12/21/2014  . Sleep apnea     "don't wear mask anymore" (12/25/2014)  . Anemia   . History of blood transfusion "several"    "related to low counts"  . Daily headache   . Arthritis     "hips" (12/25/2014)  . Anxiety     "sometimes; don't take anything for it" (12/25/2014)  . 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  Repeat biopsy early 2016 membranous w/o activity so meds weaned off. Big flare Aug '16 creat 6, 3rd biopsy DPGN, meds resumed. Ended up starting HD Sept 2016  . Stroke ([place])   . History of vascular access device     2016: Sept 9  R IJ cath per IR.  Sept 20 not candidate for fistula due to disease veins, had LUA hybrid graft placed. Sept 21 - steal syndrome, LUA AVG ligated. Oct 7 - new left femoral Gore-tex loop graft per [facility]. Oct 29 - removal R IJ tunneled HD cath    Narda Bonds 04/30/2015,11:44 PM

## 2015-04-07 NOTE — H&P (Signed)
PULMONARY / CRITICAL CARE MEDICINE   Name: [name] MRN: [id number] DOB: 03/25/1976    ADMISSION DATE:  04/14/2015  REFERRING MD:  n/a  CHIEF COMPLAINT:  Altered mental status  HISTORY OF PRESENT ILLNESS:   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 She resides at a nursing facility. She was admitted 03/29/15 for multiple problems including a painful rash, hematochezia with supratherapeutic INR and sacral wounds. She was discharged on 04/03/15 to SNF.   She presents today from the nursing home with increased lethargy to the point of being non-responsive and was intubated immediately upon arrival. Per report she was hypoglycemic when EMS arrived. An I/O was placed for access. She missed HD yesterday. A femoral central line was placed in the ED.   Review of labs shows stable thrombocytopenia and anemia, metabolic acidosis.   Her mother is at bedside and says that she hadn't complained of anything new. Her skin has been getting worse, not better. No reports of fever / chills / nausea / vomiting / diarrhea / chest pain / shortness of breath.   PAST MEDICAL HISTORY :  She  has a past medical history of Hypertension; Cardiac arrhythmia; SVT (supraventricular tachycardia) ([place]); Fainting; Shortness of breath; Chest pain at rest; Lupus (systemic lupus erythematosus) ([place]); GERD (gastroesophageal reflux disease); Fibromyalgia; Irritable bowel syndrome (IBS); Hypercholesterolemia; Heart murmur; TIA (transient ischemic attack) (12/21/2014); Sleep apnea; Anemia; History of blood transfusion ("several"); Daily headache; Arthritis; Anxiety; ESRD (end stage renal disease) on dialysis ([place]) (since 12/07/2014); Stroke [facility]); and History of vascular access device.  PAST SURGICAL HISTORY: She  has past surgical history that includes Cesarean  section (2001; 2010); Cervical biopsy (2013); Supraventricular tachycardia ablation (07/21/12 ); Cardiac catheterization (N/A, 12/14/2014); Insertion hybrid anteriovenous gortex graft (Left, 12/18/2014); Ligation of arteriovenous  fistula (Left, 12/19/2014); Appendectomy (2011); Tubal ligation (2010); TEE without cardioversion (N/A, 12/25/2014); AV fistula placement (Left, 01/04/2015); and TEE without cardioversion (N/A, 01/30/2015).  Allergies  Allergen Reactions  . Food Swelling    Red peppers    No current facility-administered medications on file prior to encounter.   Current Outpatient Prescriptions on File Prior to Encounter  Medication Sig  . Amino Acids-Protein Hydrolys (FEEDING SUPPLEMENT, PRO-STAT SUGAR FREE 64,) LIQD Take 30 mLs by mouth daily.  Marland Kitchen atorvastatin (LIPITOR) 20 MG tablet Take 1 tablet (20 mg total) by mouth daily at 6 PM.  . Calcium Carbonate Antacid (TUMS PO) Take 1-2 tablets by mouth daily as needed (heartburn).  . cyclobenzaprine (FLEXERIL) 10 MG tablet Take 1 tablet (10 mg total) by mouth at bedtime as needed for muscle spasms.  . Darbepoetin Alfa (ARANESP) 200 MCG/0.4ML SOSY injection Inject 0.4 mLs (200 mcg total) into the vein every Thursday with hemodialysis.  . DULoxetine 40 MG CPEP Take 40 mg by mouth daily.  . feeding supplement (BOOST / RESOURCE BREEZE) LIQD Take 1 Container by mouth daily.  . fluconazole (DIFLUCAN) 150 MG tablet Take 1 tablet (150 mg total) by mouth daily.  . hydroxychloroquine (PLAQUENIL) 200 MG tablet Take 2 tablets (400 mg total) by mouth daily.  Marland Kitchen METOPROLOL TARTRATE PO Take 25 mg by mouth daily. Take 0.5 tablet daily for hypertension  . midodrine (PROAMATINE) 5 MG tablet Take 1 tablet (5 mg total) by mouth 2 (two) times daily with a meal.  . mineral oil-hydrophilic petrolatum (AQUAPHOR) ointment Apply topically 2 (two) times  daily.  . multivitamin (RENA-VIT) TABS tablet Take 1 tablet by mouth at bedtime.  Marland Kitchen OLANZapine zydis (ZYPREXA) 10 MG  disintegrating tablet Take 1 tablet (10 mg total) by mouth 2 (two) times daily after a meal.  . oxyCODONE-acetaminophen (PERCOCET/ROXICET) 5-325 MG tablet Take 1 tablet by mouth every 6 (six) hours as needed for severe pain.  . pantoprazole (PROTONIX) 20 MG tablet Take 1 tablet (20 mg total) by mouth daily.  . polyethylene glycol (MIRALAX / GLYCOLAX) packet Take 17 g by mouth daily as needed for mild constipation.  . terbinafine (LAMISIL) 1 % cream Apply topically 2 (two) times daily.  Marland Kitchen trimethobenzamide (TIGAN) 300 MG capsule Take 1 capsule (300 mg total) by mouth every 8 (eight) hours as needed for nausea/vomiting.    FAMILY HISTORY:  Her has no family status information on file.   SOCIAL HISTORY: She  reports that she has never smoked. She has never used smokeless tobacco. She reports that she does not drink alcohol or use illicit drugs.  REVIEW OF SYSTEMS:   Unable to obtain 2/2 mental status / intubation  SUBJECTIVE:  Patient minimally responsive to noxious stimuli.   VITAL SIGNS: BP 89/69 mmHg  Pulse 114  Temp(Src) 96.4 F (35.8 C)  Resp 24  Ht 5\' 3"  (1.6 m)  Wt 77.111 kg (170 lb)  BMI 30.12 kg/m2  SpO2 99%  LMP 03/18/2015  HEMODYNAMICS:    VENTILATOR SETTINGS: Vent Mode:  [-] PRVC FiO2 (%):  [100 %] 100 % Set Rate:  [14 bmp] 14 bmp Vt Set:  [500 mL] 500 mL PEEP:  [5 cmH20] 5 cmH20 Plateau Pressure:  [16 cmH20] 16 cmH20  INTAKE / OUTPUT:    PHYSICAL EXAMINATION: General:  Well-developed chronically ill appearing AAF, intubated Neuro:  Minimally responsive to noxious stimuli.  HEENT:  No gross abnormalities. ETT/OGT in place.  Cardiovascular:  S1, s2, no m/r/g, mildly tachy. Distal pulses palpable Lungs:  Coarse bilaterally on anterior exam. No wheezes, rales or ronchi. Vent effort. Symmetrical expansion.  Abdomen:  Obese, soft, no overt tenderness. +bs, no mass. Musculoskeletal:  No obvious bony abnormalities. AV graft L thigh w/ palpable thrill Skin:   Diffuse ecchymotic and scaling rash on all extremities, torso, sparing of face. Sacral ulcers.   LABS:  BMET  Recent Labs Lab 04/02/15 1219 04/23/2015 2015 04/30/2015 2023  NA 138 142 141  K 3.7 4.1 3.8  CL 99* 100* 103  CO2 23 16*  --   BUN 18 23* 23*  CREATININE 3.99* 4.68* 4.30*  GLUCOSE 80 89 83    Electrolytes  Recent Labs Lab 04/02/15 1219 04/10/2015 2015  CALCIUM 10.4* 8.9  PHOS 1.3*  --     CBC  Recent Labs Lab 04/02/15 1219 04/03/15 0810 04/24/2015 2015 04/23/2015 2023  WBC 7.0 6.0 7.3  --   HGB 9.4* 10.1* 8.8* 9.9*  HCT 29.0* 30.9* 28.9* 29.0*  PLT 37* 31* PENDING  --     Coag's  Recent Labs Lab 04/02/15 1020 04/03/15 0810 04/15/2015 2015  INR 5.46* 6.10* 2.16*    Sepsis Markers  Recent Labs Lab 04/21/2015 2023  LATICACIDVEN 12.02*    ABG No results for input(s): PHART, PCO2ART, PO2ART in the last 168 hours.  Liver Enzymes  Recent Labs Lab 04/02/15 1219 04/13/2015 2015  AST  --  60*  ALT  --  36  ALKPHOS  --  178*  BILITOT  --  1.3*  ALBUMIN 1.6* 1.4*    Cardiac Enzymes No results  for input(s): TROPONINI, PROBNP in the last 168 hours.  Glucose No results for input(s): GLUCAP in the last 168 hours.  Imaging Ct Head Wo Contrast  04/24/2015  CLINICAL DATA:  Unresponsive since 6 a.m., missed dialysis EXAM: CT HEAD WITHOUT CONTRAST TECHNIQUE: Contiguous axial images were obtained from the base of the skull through the vertex without intravenous contrast. COMPARISON:  03/29/2015 FINDINGS: Diffuse atrophy and low attenuation in the deep white matter. No hydrocephalus. More focal low attenuation posterior right frontal lobe consistent with encephalomalacia from known prior infarct. There is no hemorrhage or extra-axial fluid. Calvarium intact. Diffuse osteosclerosis of the skull consistent with renal failure. IMPRESSION: Chronic involutional change and prior infarct with no acute findings Electronically Signed   By: Skipper Cliche M.D.   On:  04/20/2015 21:18   Dg Chest Port 1 View  04/30/2015  CLINICAL DATA:  Sepsis.  Assess central line placement. EXAM: PORTABLE CHEST 1 VIEW COMPARISON:  Chest radiograph March 29, 2015 FINDINGS: Endotracheal tube tip projects 3.9 cm above the carina. Nasogastric tube tip projects in proximal stomach with side port at GE junction region. The cardiac silhouette is mild to moderately enlarged, similar. Mediastinal silhouette is nonsuspicious. Pulmonary vascular congestion with alveolar airspace opacities RIGHT midlung zone, LEFT lung base. Small pleural effusions. No pneumothorax. LEFT subclavian stent. Osseous structures are normal. IMPRESSION: Endotracheal tube tip projects 3.9 cm above the carina. Nasogastric tube tip projects in proximal stomach with side port at GE junction region. Stable cardiomegaly and pulmonary vascular congestion. RIGHT midlung zone and LEFT lung base consolidation could represent confluent edema or pneumonia. Electronically Signed   By: Elon Alas M.D.   On: 03/31/2015 21:07    STUDIES:  TTE 01/30/15:  - Left ventricle: Systolic function was normal. The estimated ejection fraction was in the range of 55% to 60%. Wall motion was normal; there were no regional wall motion abnormalities. - Aortic valve: No evidence of vegetation. There was trivial regurgitation. - Mitral valve: No evidence of vegetation. - Right atrium: No evidence of thrombus in the atrial cavity or appendage. - Atrial septum: No defect or patent foramen ovale was identified. - Tricuspid valve: No evidence of vegetation. - Pulmonic valve: No evidence of vegetation.  CULTURES: Blood 04/25/2015 Urine 04/17/2015  Prior urine cx (02/2015) have grown VRE, Kleb  ANTIBIOTICS: Received vanc and zosyn in the ED  SIGNIFICANT EVENTS: none  LINES/TUBES: R fem CVC 04/16/2015 ETT 04/23/2015 Foley 04/09/2015  DISCUSSION: 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 She presents tonight with altered mental status requiring intubation for airway protection and ventilatory support and shock of unclear etiology. CT head was negative. INR is appropriate. Cultures are pending.   ASSESSMENT / PLAN:  PULMONARY A: Acute respiratory failure 2/2 altered mental status Degree of hypoxia is unclear Bilateral pleural effusions likely related to volume overload / missed HD  P:   Ventilatory support. Wean FiO2 as tolerated Minimize sedation SBT when able  CARDIOVASCULAR A:  Shock - presumed septic Sinus tachycardia  P:  Pressor support to maintain MAP >65 Stress dose steroids given underlying SLE and intermittent steroid use   RENAL A:   ESRD on HD via L thigh AV graft TThSa - missed last session Metabolic (lactic) acidosis  P:   Renal consult; discussed  with Dr. Justin Mend, will hold off on dialysis for now; continue fluid resusucitation Trend lactate May require CRRT given shock  GASTROINTESTINAL A:   Hx GERD  P:   Continue protonix  HEMATOLOGIC A:   Anemia stable, thrombocytopenia stable, WBC stable Therapeutically anticoagulated w/ coumadin for history of (marantic?) endocarditis  P:  Hold warfarin No evidence of vegetations on recent TTE Trend CBC  INFECTIOUS A:   Concern for septic shock; source unclear CXR w/ possible pneumonia Urine dirty Extensive rash with skin breakdown  P:   Broad spectrum coverage with Vanc / Zosyn (should be adequate based on prior culture data) Follow cultures  ENDOCRINE A:   Hypoglycemia    P:   Maintain blood glucose >120  RHEUMATOLOGIC A: SLE most recently on HCQ; previously on steroids, Cellcept  P: Stress dose steroids as above Discuss with her primary  rheumatologist  NEUROLOGIC A:   Altered mental status  Prior L MCA CVA Hx depression with psychotic features  P:   RASS goal: 0 Consider MRI if mental status fails to improve   FAMILY  - Updates: mother updated at bedside  - Inter-disciplinary family meet or Palliative Care meeting due by:  day 7  CRITICAL CARE Performed by: Dannielle Burn  Total critical care time: 65 minutes  Critical care time was exclusive of separately billable procedures and treating other patients.  Critical care was necessary to treat or prevent imminent or life-threatening deterioration.  Critical care was time spent personally by me on the following activities: development of treatment plan with patient and/or surrogate as well as nursing, discussions with consultants, evaluation of patient's response to treatment, examination of patient, obtaining history from patient or surrogate, ordering and performing treatments and interventions, ordering and review of laboratory studies, ordering and review of radiographic studies, pulse oximetry and re-evaluation of patient's condition.   Yisroel Ramming, MD Pulmonary and Critical Care Medicine [facility] Pager: [phone]  04/01/2015, 9:16 PM

## 2015-04-08 ENCOUNTER — Inpatient Hospital Stay (HOSPITAL_COMMUNITY): Payer: Medicare Other

## 2015-04-08 ENCOUNTER — Encounter: Payer: Self-pay | Admitting: Internal Medicine

## 2015-04-08 DIAGNOSIS — R234 Changes in skin texture: Secondary | ICD-10-CM | POA: Insufficient documentation

## 2015-04-08 DIAGNOSIS — F329 Major depressive disorder, single episode, unspecified: Secondary | ICD-10-CM | POA: Insufficient documentation

## 2015-04-08 DIAGNOSIS — J9600 Acute respiratory failure, unspecified whether with hypoxia or hypercapnia: Secondary | ICD-10-CM

## 2015-04-08 DIAGNOSIS — F32A Depression, unspecified: Secondary | ICD-10-CM | POA: Insufficient documentation

## 2015-04-08 DIAGNOSIS — J189 Pneumonia, unspecified organism: Secondary | ICD-10-CM

## 2015-04-08 DIAGNOSIS — I469 Cardiac arrest, cause unspecified: Secondary | ICD-10-CM

## 2015-04-08 DIAGNOSIS — K219 Gastro-esophageal reflux disease without esophagitis: Secondary | ICD-10-CM | POA: Insufficient documentation

## 2015-04-08 LAB — COMPREHENSIVE METABOLIC PANEL
ALT: 52 U/L (ref 14–54)
AST: 258 U/L — AB (ref 15–41)
Albumin: <1 g/dL — ABNORMAL LOW (ref 3.5–5.0)
Alkaline Phosphatase: 170 U/L — ABNORMAL HIGH (ref 38–126)
Anion gap: 27 — ABNORMAL HIGH (ref 5–15)
BUN: 23 mg/dL — AB (ref 6–20)
CHLORIDE: 105 mmol/L (ref 101–111)
CO2: 14 mmol/L — AB (ref 22–32)
CREATININE: 4.15 mg/dL — AB (ref 0.44–1.00)
Calcium: 8.3 mg/dL — ABNORMAL LOW (ref 8.9–10.3)
GFR, EST AFRICAN AMERICAN: 15 mL/min — AB (ref >60)
GFR, EST NON AFRICAN AMERICAN: 13 mL/min — AB (ref >60)
Glucose, Bld: 45 mg/dL — ABNORMAL LOW (ref 65–99)
POTASSIUM: 5.6 mmol/L — AB (ref 3.5–5.1)
SODIUM: 146 mmol/L — AB (ref 135–145)
Total Bilirubin: 0.9 mg/dL (ref 0.3–1.2)
Total Protein: 4.2 g/dL — ABNORMAL LOW (ref 6.5–8.1)

## 2015-04-08 LAB — BASIC METABOLIC PANEL
ANION GAP: 21 — AB (ref 5–15)
BUN: 23 mg/dL — ABNORMAL HIGH (ref 6–20)
CALCIUM: 8.4 mg/dL — AB (ref 8.9–10.3)
CO2: 14 mmol/L — ABNORMAL LOW (ref 22–32)
Chloride: 106 mmol/L (ref 101–111)
Creatinine, Ser: 4.36 mg/dL — ABNORMAL HIGH (ref 0.44–1.00)
GFR, EST AFRICAN AMERICAN: 14 mL/min — AB (ref >60)
GFR, EST NON AFRICAN AMERICAN: 12 mL/min — AB (ref >60)
Glucose, Bld: 83 mg/dL (ref 65–99)
POTASSIUM: 3.6 mmol/L (ref 3.5–5.1)
SODIUM: 141 mmol/L (ref 135–145)

## 2015-04-08 LAB — CBC
HCT: 30.7 % — ABNORMAL LOW (ref 36.0–46.0)
HEMATOCRIT: 26.8 % — AB (ref 36.0–46.0)
HEMOGLOBIN: 9.7 g/dL — AB (ref 12.0–15.0)
HEMOGLOBIN: 8.1 g/dL — AB (ref 12.0–15.0)
MCH: 31.1 pg (ref 26.0–34.0)
MCH: 30.7 pg (ref 26.0–34.0)
MCHC: 31.6 g/dL (ref 30.0–36.0)
MCHC: 30.2 g/dL (ref 30.0–36.0)
MCV: 98.4 fL (ref 78.0–100.0)
MCV: 101.5 fL — ABNORMAL HIGH (ref 78.0–100.0)
PLATELETS: 38 10*3/uL — AB (ref 150–400)
Platelets: 50 10*3/uL — ABNORMAL LOW (ref 150–400)
RBC: 3.12 MIL/uL — AB (ref 3.87–5.11)
RBC: 2.64 MIL/uL — AB (ref 3.87–5.11)
RDW: 19.3 % — AB (ref 11.5–15.5)
RDW: 19.5 % — ABNORMAL HIGH (ref 11.5–15.5)
WBC: 4.4 10*3/uL (ref 4.0–10.5)
WBC: 10.2 10*3/uL (ref 4.0–10.5)

## 2015-04-08 LAB — BLOOD GAS, ARTERIAL
Acid-base deficit: 9.1 mmol/L — ABNORMAL HIGH (ref 0.0–2.0)
BICARBONATE: 15.5 meq/L — AB (ref 20.0–24.0)
DRAWN BY: 40415
FIO2: 1
O2 Saturation: 99.4 %
PEEP: 5 cmH2O
PH ART: 7.345 — AB (ref 7.350–7.450)
Patient temperature: 98.6
RATE: 14 resp/min
TCO2: 16.4 mmol/L (ref 0–100)
VT: 500 mL
pCO2 arterial: 29.2 mmHg — ABNORMAL LOW (ref 35.0–45.0)
pO2, Arterial: 276 mmHg — ABNORMAL HIGH (ref 80.0–100.0)

## 2015-04-08 LAB — URINE CULTURE

## 2015-04-08 LAB — PHOSPHORUS
PHOSPHORUS: 3.2 mg/dL (ref 2.5–4.6)
PHOSPHORUS: 6.4 mg/dL — AB (ref 2.5–4.6)

## 2015-04-08 LAB — MAGNESIUM
MAGNESIUM: 1.7 mg/dL (ref 1.7–2.4)
MAGNESIUM: 2 mg/dL (ref 1.7–2.4)

## 2015-04-08 LAB — GLUCOSE, CAPILLARY: GLUCOSE-CAPILLARY: 84 mg/dL (ref 65–99)

## 2015-04-08 LAB — LACTIC ACID, PLASMA: Lactic Acid, Venous: 18.5 mmol/L (ref 0.5–2.0)

## 2015-04-08 MED ORDER — DEXTROSE 5 % IV SOLN
0.0000 ug/min | INTRAVENOUS | Status: DC
  Administered 2015-04-08: 40 ug/min via INTRAVENOUS
  Filled 2015-04-08 (×2): qty 16

## 2015-04-08 MED ORDER — CHLORHEXIDINE GLUCONATE 0.12 % MT SOLN: 15.0000 mL | Freq: Two times a day (BID) | OROMUCOSAL | Status: DC

## 2015-04-08 MED ORDER — ANTISEPTIC ORAL RINSE SOLUTION (CORINZ): 7.0000 mL | Freq: Four times a day (QID) | OROMUCOSAL | Status: DC

## 2015-04-08 MED ORDER — SODIUM BICARBONATE 8.4 % IV SOLN
50.0000 meq | Freq: Once | INTRAVENOUS | Status: AC
  Administered 2015-04-08: 50 meq via INTRAVENOUS

## 2015-04-08 MED ORDER — EPINEPHRINE HCL 0.1 MG/ML IJ SOSY
1.0000 mg | PREFILLED_SYRINGE | Freq: Once | INTRAMUSCULAR | Status: AC
  Administered 2015-04-08: 1 mg via INTRAVENOUS

## 2015-04-08 MED ORDER — SODIUM CHLORIDE 0.9 % IV BOLUS (SEPSIS)
500.0000 mL | Freq: Once | INTRAVENOUS | Status: AC
  Administered 2015-04-08: 500 mL via INTRAVENOUS

## 2015-04-08 MED ORDER — CHLORHEXIDINE GLUCONATE 0.12% ORAL RINSE (MEDLINE KIT)
15.0000 mL | Freq: Two times a day (BID) | OROMUCOSAL | Status: DC
  Administered 2015-04-08: 15 mL via OROMUCOSAL

## 2015-04-08 MED ORDER — SODIUM CHLORIDE 0.9 % IV SOLN
100.0000 ug/h | INTRAVENOUS | Status: DC
  Administered 2015-04-08: 25 ug/h via INTRAVENOUS
  Filled 2015-04-08: qty 50

## 2015-04-08 MED ORDER — SODIUM CHLORIDE 0.9 % IV SOLN
0.0300 [IU]/min | INTRAVENOUS | Status: DC
  Administered 2015-04-08: 0.03 [IU]/min via INTRAVENOUS
  Filled 2015-04-08: qty 2

## 2015-04-08 MED ORDER — EPINEPHRINE HCL 1 MG/ML IJ SOLN: 1.0000 mg | Freq: Once | INTRAMUSCULAR | Status: DC

## 2015-04-08 MED ORDER — SODIUM BICARBONATE 8.4 % IV SOLN
50.0000 meq | Freq: Once | INTRAVENOUS | Status: AC
  Administered 2015-04-08: 50 meq via INTRAVENOUS
  Filled 2015-04-08: qty 50

## 2015-04-08 MED ORDER — DEXTROSE 50 % IV SOLN
50.0000 mL | Freq: Once | INTRAVENOUS | Status: AC
  Administered 2015-04-08: 50 mL via INTRAVENOUS

## 2015-04-08 MED ORDER — CETYLPYRIDINIUM CHLORIDE 0.05 % MT LIQD: 7.0000 mL | Freq: Two times a day (BID) | OROMUCOSAL | Status: DC

## 2015-04-09 LAB — GLUCOSE, CAPILLARY
Glucose-Capillary: 132 mg/dL — ABNORMAL HIGH (ref 65–99)
Glucose-Capillary: 46 mg/dL — ABNORMAL LOW (ref 65–99)

## 2015-04-10 LAB — CULTURE, BLOOD (ROUTINE X 2)

## 2015-04-12 LAB — CULTURE, BLOOD (ROUTINE X 2): Culture: NO GROWTH

## 2015-04-15 ENCOUNTER — Telehealth: Payer: Self-pay

## 2015-04-15 NOTE — Telephone Encounter (Signed)
On 2015/04/15 I received a death certificate from [facility] (original). The death certificate is for cremation. The patient is a patient of Doctor McQuaid. The death certificate will be taken to [facility]) today for signature. On 2015-04-15 I received the death certificate back from Doctor McQuaid. I got the death certificate ready and called the funeral home to let them know the death certificate is ready for pickup.

## 2015-04-17 MED FILL — Medication: Qty: 1 | Status: AC

## 2015-04-29 LAB — AFB CULTURE WITH SMEAR (NOT AT ARMC): Acid Fast Smear: NONE SEEN

## 2015-05-01 NOTE — Care Management Note (Signed)
Case Management Note  Patient Details  Name: Neketa Lace MRN: [id number] Date of Birth: 05/09/1975  Subjective/Objective:                    Action/Plan:   Expected Discharge Date:                  Expected Discharge Plan:  Skilled Nursing Facility  In-House Referral:  Clinical Social Work  Discharge planning Services     Post Acute Care Choice:    Choice offered to:     DME Arranged:    DME Agency:     HH Arranged:    [place] Agency:     Status of Service:  Completed, signed off  Medicare Important Message Given:    Date Medicare IM Given:    Medicare IM give by:    Date Additional Medicare IM Given:    Additional Medicare Important Message give by:     If discussed at [place] of Stay Meetings, dates discussed:    Additional Comments:  Vergie Living, RN 05/08/2015, 3:56 PM

## 2015-05-01 NOTE — Assessment & Plan Note (Signed)
"  she has mainly dry skin induced rash with some features of contact dermatitis and possibly coumadin induced ecchymoses. We prescribed her the creams which she needs to be applied." SNF- this was description of skin on d/c to SNF yesterday, not what I'm seeing today. Whatever is going on with skin isn't improving and I is worsening. At SNF multiple transfers will be necessary for care and dialysis 3 times a week, all painful. I spoke with dialysis, the MD was there and made her aware, then spoke with Dr Dareen Piano who had taken care of pt this last hospitalization and made him aware of the situation today.Marland Kitchen His concern was pt picking up a worse infection in the hospital. He told me they had not been able to get a Dermatologist to see her and she had no insurance for LTAC although they were working on it. I understand his position, he understood mine. We will try to care for her at SNF. Realistically, it won't be long before she is in the hospital again.

## 2015-05-01 NOTE — Progress Notes (Signed)
Wasted 291mL fentanyl with Lenor Coffin RN

## 2015-05-01 NOTE — Assessment & Plan Note (Signed)
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

## 2015-05-01 NOTE — Assessment & Plan Note (Signed)
SNF - onset 2 years ago, treated aggressively but ESRD onset summer 2016 with downhill trajectory since then

## 2015-05-01 NOTE — Assessment & Plan Note (Signed)
SNF - Not reported as unstable, cont protonix

## 2015-05-01 NOTE — Progress Notes (Signed)
  Patient Name: [name]   MRN: [id number]   Date of Birth/ Sex: 11-15-1975 , female      Admission Date: 04/05/2015  Attending Provider: Collene Gobble, MD  Primary Diagnosis: <principal problem not specified>   Indication: Pt was in her usual state of health until this AM, when she was noted to be Ventricular Fibrillation. Code blue was subsequently called. At the time of arrival on scene, ACLS protocol was underway. CBG 46, given Amp D50.    Technical Description:  - CPR performance duration:  5  minutes  - Was defibrillation or cardioversion used? Yes   - Was external pacer placed? No  - Was patient intubated pre/post CPR? Yes   Medications Administered: Y = Yes; Blank = No Amiodarone    Atropine    Calcium    Epinephrine  2  Lidocaine    Magnesium    Norepinephrine    Phenylephrine    Sodium bicarbonate  1  Vasopressin     Post CPR evaluation:  - Final Status - Was patient successfully resuscitated ? Yes - What is current rhythm? Sinus Tachycardia - What is current hemodynamic status? BP stable  Miscellaneous Information:  - Labs sent, including: CMP, CBC, Mag, Phos, Lactic Acid, ABG  - Primary team notified?  Yes  - Family Notified? Yes  - Additional notes/ transfer status: Will continue current management, family to discuss goals of care.      Corky Sox, MD  04-19-2015, 7:47 AM

## 2015-05-01 NOTE — Progress Notes (Signed)
Nutrition Brief Note  Chart reviewed. Plans for comfort care.  No nutrition interventions warranted at this time.  Please consult as needed.   Molli Barrows, RD, LDN, [place] Pager [phone] After Hours Pager [phone]

## 2015-05-01 NOTE — Assessment & Plan Note (Signed)
SNF - chronic , ongoing;cont cymbalta

## 2015-05-01 NOTE — Assessment & Plan Note (Signed)
her INR was 6.1 on discharge. We held her coumadin. Prior to discharge, patient also received 2.5 mg of vitamin K.SNF - repeat INR's will be done and coumadin actively titrated

## 2015-05-01 NOTE — Discharge Summary (Signed)
  Name: [name] MRN: [id number] DOB: [birth year] 41 y.o.  Date of Admission: 04/22/2015  7:21 PM Date of Discharge: 2015/04/16 Attending Physician: Collene Gobble, MD  Discharge Diagnosis: Active Problems:   Respiratory failure requiring intubation ([place])   Acute respiratory failure ([place])   HCAP (healthcare-associated pneumonia)   Cardiac arrest ([place])   Cause of death: Sepsis, HCAP Time of death: 14:39  Disposition and follow-up:   [name] was discharged from [facility] in expired condition.    Hospital Course: [name] is a 40 y.o. female w/ a very complicated PMHx including SLE, with Lupus Nephritis and ESRD on HD (TTS), prior CVA, h/o endocarditis, and depression with psychotic features, presented to the ED from SNF w/ increased lethargy to the point of being unresponsive and was intubated immediately upon arrival. Per chart review, patient was hypoglycemic when EMS arrived at SNF, intraosseus access was placed at that time. Patient missed HD on 04/09/2015. Femoral line was also placed in the ED. Patient had recently been admitted on 03/28/16 for multiple issues including a painful rash, hematochezia, supratherapeutic INR, and sacral wounds. Ms. Maheras was admitted to the ICU, started on broad spectrum antibiotics for suspected HCAP vs UTI. Also noted to have an extensive desquamating rash involving almost her entire body, not including the face and pretibial regions bilaterally. In the AM of 16-Apr-2015, patient had a V-fib arrest requiring ~6 minutes of chest compressions, 2 amps Epi, 1 amp HCO3 and had ROSC. Also noted to have CBG in the 40's, given 1 amp D50 with adequate improvement of CBG to the 150's. Patient also noted to have significant Lactic acidosis, as high as 18.5. Nephrology and PCCM service discussed extensively with family, who wished to move for comfort care. Patient was extubated and expired at 14:39 PM.   Signed: Corky Sox,  MD 04-16-15, 3:21 PM

## 2015-05-01 NOTE — Progress Notes (Signed)
Pt time of death 1439 confirmed by Regino Schultze RN and Soyla Dryer RN. No heart and lung sounds auscultated. Pupils fixed and dilated. Family at bedside and aware. MD notified.

## 2015-05-01 NOTE — Progress Notes (Signed)
PULMONARY / CRITICAL CARE MEDICINE   Name: Cathe Kollmann MRN: [id number] DOB: 08-11-1975    ADMISSION DATE:  04/04/2015  REFERRING MD:  ED  CHIEF COMPLAINT:  AMS  HISTORY OF PRESENT ILLNESS:   Ms. Otisha Davignon is a 40 y.o. female w/ a very complicated PMHx including SLE, with Lupus Nephritis and ESRD on HD (TTS), prior CVA, h/o endocarditis, and depression with psychotic features, presented to the ED from SNF w/ increased lethargy to the point of being unresponsive and was intubated immediately upon arrival. Per chart review, patient was hypoglycemic when EMS arrived at SNF, intraosseus access was placed at that time. Patient missed HD yesterday. Femoral line was also placed in the ED.   Patient had recently been admitted on 03/28/16 for multiple issues including a painful rash, hematochezia, supratherapeutic INR, and sacral wounds.  SUBJECTIVE:  Patient went into V-fib arrest this AM, required 6 minutes of CPR, 2 amps Epi, 1 amp Bicarb. CBG found to be 46, given 1 amp D50, most recent CBG 130's. Family updated.   VITAL SIGNS: BP 78/49 mmHg  Pulse 144  Temp(Src) 99.7 F (37.6 C)  Resp 16  Ht 5\' 3"  (1.6 m)  Wt 171 lb 15.3 oz (78 kg)  BMI 30.47 kg/m2  SpO2 98%  LMP 03/18/2015  HEMODYNAMICS:    VENTILATOR SETTINGS: Vent Mode:  [-] PRVC FiO2 (%):  [40 %-100 %] 40 % Set Rate:  [14 bmp] 14 bmp Vt Set:  [500 mL] 500 mL PEEP:  [5 cmH20] 5 cmH20 Plateau Pressure:  [16 cmH20-23 cmH20] 23 cmH20  INTAKE / OUTPUT: I/O last 3 completed shifts: In: 2398.7 [I.V.:2248.7; IV Piggyback:150] Out: -   PHYSICAL EXAMINATION: General:Chronically ill-appearing AA female, Intubated, sedated, on vent.  Neuro:Unresposive, not moving extremities currently. PERRL.  HEENT: No gross abnormalities. ETT/OGT in place.  Cardiovascular:Tachycardic, regular, no murmurs, gallops, or rubs. Distal pulses palpable. Lungs:Coarse bilaterally on anterior exam, scattered rhonchi. Vent effort.  Symmetrical expansion.  Abdomen: Obese, soft, no overt tenderness. +bs, no mass. Musculoskeletal: No obvious bony abnormalities. AV graft L thigh w/ palpable thrill Skin: Diffuse ecchymotic and scaling rash on all extremities, torso, sparing of face and pretibial areas bilaterally. Sacral ulcers.   LABS:  BMET  Recent Labs Lab 04/06/2015 2015 04/06/2015 2023 04-29-15 0437 April 29, 2015 0735  NA 142 141 141 146*  K 4.1 3.8 3.6 5.6*  CL 100* 103 106 105  CO2 16*  --  14* 14*  BUN 23* 23* 23* 23*  CREATININE 4.68* 4.30* 4.36* 4.15*  GLUCOSE 89 83 83 45*    Electrolytes  Recent Labs Lab 04/02/15 1219 04/25/2015 2015 04-29-2015 0437 April 29, 2015 0735  CALCIUM 10.4* 8.9 8.4* 8.3*  MG  --   --  1.7 2.0  PHOS 1.3*  --  3.2 6.4*    CBC  Recent Labs Lab 04/20/2015 2015 04/22/2015 2023 04-29-2015 0437 2015/04/29 0735  WBC 7.3  --  4.4 10.2  HGB 8.8* 9.9* 9.7* 8.1*  HCT 28.9* 29.0* 30.7* 26.8*  PLT 41*  --  38* 50*    Coag's  Recent Labs Lab 04/02/15 1020 04/03/15 0810 04/22/2015 2015  INR 5.46* 6.10* 2.16*    Sepsis Markers  Recent Labs Lab 04/26/2015 2023 04/09/2015 2354  LATICACIDVEN 12.02* 9.81*    ABG  Recent Labs Lab 2015-04-29 0045  PHART 7.345*  PCO2ART 29.2*  PO2ART 276*    Liver Enzymes  Recent Labs Lab 04/02/15 1219 04/11/2015 2015 April 29, 2015 0735  AST  --  60* 258*  ALT  --  36 52  ALKPHOS  --  178* 170*  BILITOT  --  1.3* 0.9  ALBUMIN 1.6* 1.4* <1.0*    Cardiac Enzymes No results for input(s): TROPONINI, PROBNP in the last 168 hours.  Glucose  Recent Labs Lab 04/20/2015 1929 May 08, 2015 0038  GLUCAP 136* 84    Imaging Ct Head Wo Contrast  04/10/2015  CLINICAL DATA:  Unresponsive since 6 a.m., missed dialysis EXAM: CT HEAD WITHOUT CONTRAST TECHNIQUE: Contiguous axial images were obtained from the base of the skull through the vertex without intravenous contrast. COMPARISON:  03/29/2015 FINDINGS: Diffuse atrophy and low attenuation in the deep white  matter. No hydrocephalus. More focal low attenuation posterior right frontal lobe consistent with encephalomalacia from known prior infarct. There is no hemorrhage or extra-axial fluid. Calvarium intact. Diffuse osteosclerosis of the skull consistent with renal failure. IMPRESSION: Chronic involutional change and prior infarct with no acute findings Electronically Signed   By: Skipper Cliche M.D.   On: 04/05/2015 21:18   Dg Chest Port 1 View  04/18/2015  CLINICAL DATA:  Sepsis.  Assess central line placement. EXAM: PORTABLE CHEST 1 VIEW COMPARISON:  Chest radiograph March 29, 2015 FINDINGS: Endotracheal tube tip projects 3.9 cm above the carina. Nasogastric tube tip projects in proximal stomach with side port at GE junction region. The cardiac silhouette is mild to moderately enlarged, similar. Mediastinal silhouette is nonsuspicious. Pulmonary vascular congestion with alveolar airspace opacities RIGHT midlung zone, LEFT lung base. Small pleural effusions. No pneumothorax. LEFT subclavian stent. Osseous structures are normal. IMPRESSION: Endotracheal tube tip projects 3.9 cm above the carina. Nasogastric tube tip projects in proximal stomach with side port at GE junction region. Stable cardiomegaly and pulmonary vascular congestion. RIGHT midlung zone and LEFT lung base consolidation could represent confluent edema or pneumonia. Electronically Signed   By: Elon Alas M.D.   On: 04/23/2015 21:07    STUDIES:  CT head 1/8 >> Chronic involutional change and prior infarct with no acute findings CXR 1/8 >> Stable cardiomegaly and pulmonary vascular congestion. RIGHT midlung zone and LEFT lung base consolidation could represent confluent edema or pneumonia. UA 1/8 >> Turbid, many bacteria, large Hb, positive nitrites, large leukocytes.   CULTURES: Blood 1/8 >> Urine 1/8 >>  ANTIBIOTICS: Vanc 1/8 >> Zosyn 1/8 >>  SIGNIFICANT EVENTS: 1/9 V-fib arrest; required 2 amps Epi, 1 amp HCO3, 1 amp  D50  LINES/TUBES: R Femoral CVL 1/9 >>  DISCUSSION: Ms. 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 She presented with AMS requiring intubation for airway protection and ventilatory support and shock of unclear etiology. CT head was negative.  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); missed last HD Anion Gap Metabolic Acidosis Severe Lactic Acidosis Hyperkalemia Hypoglycemia P:   CRRT per renal q2h CBG's Trend lactic acid Repeat BMP in PM  GASTROINTESTINAL A:   H/o  GERD SUP Nutrition P:   Protonix NPO for now  HEMATOLOGIC A:   Relative Leukocytosis Thrombocytopenia Macrocytic Anemia On Coumadin, therapeutic INR; presumably for murantic endocarditis P:  Hold Coumadin for now Repeat CBC in AM  INFECTIOUS A:   Septic Shock Possible HCAP vs UTI Extensive skin breakdown P:   Broad Spectrum ABx; Vanc/Zosyn Follow cultures  ENDOCRINE A:   Hypoglycemia   P:   CBG's q2h  RHEUMATOLOGIC:  A:  Lupus; most recently on Plaquenil P: Steroids as above  NEUROLOGIC A:   Acute Encephalopathy Prior L. MCA CVA H/o Depression w/ Psychotic Features P:   RASS goal: -2 Consider MRI if mental status fails to improve   FAMILY  - Updates: Updated mother and sisters after V-fib arrest. Explained  that she is extremely unstable currently and has a poor prognosis given the complexity of her situation. Family would like to continue with current level of care, but encouraged them to discuss goals of care moving forward given unlikely recovery. Will discuss further with mother this afternoon.     Natasha Bence, MD PGY-3, Internal Medicine Pager: [phone]   2015-04-16, 8:57 AM

## 2015-05-01 NOTE — Progress Notes (Signed)
   17-Apr-2015 1400  Clinical Encounter Type  Visited With Patient;Family;Patient and family together;Health care provider  Visit Type Initial;Follow-up;Psychological support;Spiritual support;Social support;Critical Care  Referral From Nurse;Physician;Care management  Consult/Referral To Faith community  Spiritual Encounters  Spiritual Needs Prayer;Ritual;Emotional  Stress Factors  Patient Stress Factors Not reviewed  Family Stress Factors Exhausted;Family relationships;Health changes;Loss;Loss of control;Major life changes  Advance Directives (For Healthcare)  Does patient have an advance directive? No    Chaplain reviewed information with medical team and provided emotional and spiritual  Support via prayer and biblical literature. Chaplain also encouraged pt's family to focus on the present. Chaplain did some teaching on the beneficial practices of expressing love towards the Pt.Chaplian listened empathically and normalized family's experience; Chaplain helped Family with exploring anger and anxiety. Chaplain also explored feelings of hope, fears, loneliness, and issue of faith with the Pt's loved ones. Lastly, chaplain provided religious ritual of "anointing of the sick".

## 2015-05-01 NOTE — Procedures (Signed)
Extubation Procedure Note  Patient Details:   Name: [name] DOB: 14-Sep-1975 MRN: [id number]   Airway Documentation:     Evaluation  O2 sats: stable throughout Complications: No apparent complications Patient did tolerate procedure well. Bilateral Breath Sounds: Clear Suctioning: Airway Yes  Patient extubated with the withdrawal of life protocol. Family and RN at bedside. Patient comfortable at this time.  Myrtie Neither April 19, 2015, 2:48 PM

## 2015-05-01 NOTE — Progress Notes (Signed)
CRITICAL VALUE ALERT  Critical value received:  Lactic acid 18.5  Date of notification:  04/16/15  Time of notification:  0800  Critical value read back: yes  Nurse who received alert:  Anneita Minor RN  MD notified (1st page):  MD at bedside   Time of first page:  MD at bedside, notified immediately  MD notified (2nd page):  Time of second page:  Responding MD:  MD at bedside, notified immediately  Time MD responded:  Notified immediately

## 2015-05-01 NOTE — Assessment & Plan Note (Signed)
SNF - cont TTHSat dialysis;today trying to get all wounds dressed in order to get her to dialysis

## 2015-05-01 NOTE — Progress Notes (Signed)
ABG unsuccessful by 2 different RT's. MD aware.

## 2015-05-01 NOTE — Consult Note (Signed)
Renal Service Consult Note [facility]  Terie Lear 04/27/15 [place] Requesting Physician:  Dr Lamonte Sakai  Reason for Consult:  ESRD patient w septic shock, resp failure and vfib arrest HPI: The patient is a 40 y.o. year-old with hx of HTN, SVT, FM/ IBS and developed SLE about 2 yrs ago which was a mild disease according to family until this summer when she developed renal failure.  Had biopsy proven SLE nephritis , treated w aggressive medical Rx but ended up on HD in Sept 2016. 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  Unclear source, broad spec abx were stopped/ completed but now is admitted w worsening diffuse ecchymotic rash w desquamation in progress, septic shock , lactic acidosis and resp failure admitted overnight last night.  This am had vfib arrest requiring 5 min CPR. Post -arrest is unresponsive w agonal type underlying breathing pattern.     ROS  n/a  Past Medical History  Past Medical History  Diagnosis Date  . Hypertension   . Cardiac arrhythmia   . SVT (supraventricular tachycardia) ([place])     "today, last week, 2 wk ago; maybe 1 month ago, etc; started w/in last 3-4 yrs"(07/20/2012)  . Fainting     "~ 1 month ago; probably related to SVT" (07/20/2012)  . Shortness of breath     "related to SVT episodes" (07/20/2012)  . Chest pain at rest     "related to SVT" (07/20/2012)  . Lupus (systemic lupus erythematosus) ([place])   . GERD (gastroesophageal reflux disease)   . Fibromyalgia   . Irritable bowel syndrome (IBS)   . Hypercholesterolemia   . Heart murmur      "small" (12/25/2014)  . TIA (transient ischemic attack) 12/21/2014  . Sleep apnea     "don't wear mask anymore" (12/25/2014)  . Anemia   . History of blood transfusion "several"    "related to low counts"  . Daily headache   . Arthritis     "hips" (12/25/2014)  . Anxiety     "sometimes; don't take anything for it" (12/25/2014)  . ESRD (end stage renal disease) on dialysis ([place]) since 12/07/2014    Diagnosed Aug 2014 w SLE nephritis DPGN by biopsy, rx cellcept/ steroids.  Repeat biopsy early 2016 membranous w/o activity so meds weaned off. Big flare Aug '16 creat 6, 3rd biopsy DPGN, meds resumed. Ended up starting HD Sept 2016  . Stroke ([place])   . History of vascular access device     2016: Sept 9  R IJ cath per IR.  Sept 20 not candidate for fistula due to disease veins, had LUA hybrid graft placed. Sept 21 - steal syndrome, LUA AVG ligated. Oct 7 - new left femoral Gore-tex loop graft per [facility]. Oct 29 - removal R IJ tunneled HD cath   Past Surgical History  Past Surgical History  Procedure Laterality Date  . Cesarean section  2001; 2010  . Cervical biopsy  2013  . Supraventricular tachycardia ablation  07/21/12     Dr. Cristopher Peru   . Peripheral vascular catheterization N/A 12/14/2014    Procedure: Upper Extremity Venography;  Surgeon: Elam Dutch, MD;  Location: [place] [facility];  Service: Cardiovascular;  Laterality: N/A;  . Insertion hybrid anteriovenous gortex graft Left 12/18/2014    Procedure: INSERTION GORE HYBRID ARTERIOVENOUS GRAFT LEFT AXILLO-BRACHIAL.;  Surgeon: Serafina Mitchell, MD;  Location: [facility];  Service: Vascular;  Laterality: Left;  . Ligation of arteriovenous  fistula Left 12/19/2014    Procedure: LIGATION OF FISTULA;  Surgeon: Elam Dutch, MD;  Location: [place];  Service: Vascular;  Laterality: Left;  . Appendectomy  2011  . Tubal ligation  2010  . Tee without cardioversion N/A 12/25/2014    Procedure: TRANSESOPHAGEAL ECHOCARDIOGRAM (TEE);  Surgeon: Sueanne Margarita, MD;  Location: [place];  Service: Cardiovascular;  Laterality: N/A;  . Av fistula placement Left 01/04/2015    Procedure: INSERTION OF ARTERIOVENOUS (AV) GORE-TEX GRAFT THIGH;  Surgeon: Rosetta Posner, MD;  Location: [place];  Service: Vascular;  Laterality: Left;  . Tee without cardioversion N/A 01/30/2015    Procedure: TRANSESOPHAGEAL ECHOCARDIOGRAM (TEE);  Surgeon: Lelon Perla, MD;  Location: [facility];  Service: Cardiovascular;  Laterality: N/A;   Family History  Family History  Problem Relation Age of Onset  . Cancer Mother     breast and ovarian  . BRCA 1/2 Sister    Social History  reports that she has never smoked. She has never used smokeless tobacco. She reports that she does not drink alcohol or use illicit drugs. Allergies  Allergies  Allergen Reactions  . Food Swelling    Red peppers   Home medications Prior to Admission medications   Medication Sig Start Date End Date Taking? Authorizing Provider  Amino Acids-Protein Hydrolys (FEEDING SUPPLEMENT, PRO-STAT SUGAR FREE 64,) LIQD Take 30 mLs by mouth daily. 03/22/15  Yes Jule Ser, DO  atorvastatin (LIPITOR) 20 MG tablet Take 1 tablet (20 mg total) by mouth daily at 6 PM. 01/17/15  Yes Lavon Paganini Angiulli, PA-C  Calcium Carbonate Antacid (TUMS PO) Take 1-2 tablets by mouth daily as needed (heartburn).   Yes Historical Provider, MD  cyclobenzaprine (FLEXERIL) 10 MG tablet Take 1 tablet (10 mg total) by mouth at bedtime as needed for muscle spasms. 01/17/15  Yes Daniel J Angiulli, PA-C  DULoxetine 40 MG CPEP Take 40 mg by mouth daily. 03/22/15  Yes Jule Ser, DO  hydroxychloroquine (PLAQUENIL) 200 MG tablet Take 2 tablets (400 mg total) by mouth daily. 01/17/15  Yes Daniel J Angiulli, PA-C  metoprolol tartrate (LOPRESSOR) 25 MG tablet Take 12.5 mg by mouth every morning.   Yes Historical Provider, MD  midodrine (PROAMATINE) 5 MG tablet Take 1 tablet (5 mg total) by mouth 2 (two) times daily with a meal.  03/22/15  Yes Jule Ser, DO  morphine (ROXANOL) 20 MG/ML concentrated solution Take 5 mg by mouth every 4 (four) hours as needed for severe pain.   Yes Historical Provider, MD  Multiple Vitamin (MULTIVITAMIN WITH MINERALS) TABS tablet Take 1 tablet by mouth daily.   Yes Historical Provider, MD  OLANZapine zydis (ZYPREXA) 10 MG disintegrating tablet Take 1 tablet (10 mg total) by mouth 2 (two) times daily after a meal. 03/22/15  Yes Jule Ser, DO  oxyCODONE-acetaminophen (PERCOCET/ROXICET) 5-325 MG tablet Take 1 tablet by mouth every 6 (six) hours as needed for severe pain. 03/22/15  Yes Jule Ser, DO  pantoprazole (PROTONIX) 20 MG tablet Take 1 tablet (20 mg total) by mouth daily. 01/17/15  Yes Daniel J Angiulli, PA-C  polyethylene glycol (MIRALAX / GLYCOLAX) packet Take 17 g by mouth daily as  needed for mild constipation. 02/04/15  Yes Collier Salina, MD  trimethobenzamide (TIGAN) 300 MG capsule Take 1 capsule (300 mg total) by mouth every 8 (eight) hours as needed for nausea/vomiting. 02/04/15  Yes Collier Salina, MD   Liver Function Tests  Recent Labs Lab 04/02/15 1219 04/28/2015 2015 2015-04-18 0735  AST  --  60* 258*  ALT  --  36 52  ALKPHOS  --  178* 170*  BILITOT  --  1.3* 0.9  PROT  --  5.5* 4.2*  ALBUMIN 1.6* 1.4* <1.0*   No results for input(s): LIPASE, AMYLASE in the last 168 hours. CBC  Recent Labs Lab 04/02/15 1219  04/04/2015 2015 04/19/2015 2023 04/18/2015 0437 Apr 18, 2015 0735  WBC 7.0  < > 7.3  --  4.4 10.2  NEUTROABS 6.3  --  5.9  --   --   --   HGB 9.4*  < > 8.8* 9.9* 9.7* 8.1*  HCT 29.0*  < > 28.9* 29.0* 30.7* 26.8*  MCV 93.9  < > 98.6  --  98.4 101.5*  PLT 37*  < > 41*  --  38* 50*  < > = values in this interval not displayed. Basic Metabolic Panel  Recent Labs Lab 04/02/15 1219 04/02/2015 2015 04/04/2015 2023 04-18-15 0437 18-Apr-2015 0735  NA 138 142 141 141 146*  K 3.7 4.1 3.8 3.6 5.6*  CL 99* 100* 103 106 105  CO2 23 16*  --  14* 14*   GLUCOSE 80 89 83 83 45*  BUN 18 23* 23* 23* 23*  CREATININE 3.99* 4.68* 4.30* 4.36* 4.15*  CALCIUM 10.4* 8.9  --  8.4* 8.3*  PHOS 1.3*  --   --  3.2 6.4*    Filed Vitals:   2015/04/18 0730 Apr 18, 2015 0800 04/18/15 0900 April 18, 2015 1000  BP: 144/115 78/49 77/56 98/70   Pulse: 144     Temp:  99.7 F (37.6 C) 99.7 F (37.6 C) 99.1 F (37.3 C)  TempSrc:      Resp: 15 16 16 14   Height:      Weight:      SpO2: 98%      Exam On vent unresponsive, shallow agonal resp pattern  Sclera anicteric, throat clear No jvd Chest coarse bilat RRR no rub or gallop Abd obese soft nondistended, no bs Skin diffuse ecchymotic rash w desquamation back,  bilat LE's , UE"s, torso MS no joint effusions Ext no sig LE edema Mild UE edema bilat Neuro is unresponsive, on vent  TTS [facility]  Assessment: 1 Shock r/o sepsis 2 Desquamating skin rash, severe 3 ESRD on HD 4 Vfib arrest 5 SLE on plaquenil 6 Hx psychosis on zyprexa 7 Chron hypotens on midodrine 8 Recurrent UTI's 9 Hx pseudo cath bacteremia 10 hx marantic endocarditis w CVA   Plan -  Family/ mother have decided to transition to comfort care later today after family/ children/ nieces/ nephews have had a chace to spend time in the room.   Have d/w primary team.     Kelly Splinter MD [place] pager [phone]    cell [phone] 2015-04-18, 10:24 AM

## 2015-05-01 NOTE — Assessment & Plan Note (Signed)
SNF 2/2 CVA, 2/2 nephritis ; SNF - actively titrate coumadin as prophylaxis

## 2015-05-01 NOTE — Consult Note (Signed)
WOC consult cancelled per CCM team request; pt now with comfort care goals. Please re-consult if further assistance is needed.  Thank-you,  Julien Girt MSN, [place], [place], [place], [place]

## 2015-05-01 NOTE — Assessment & Plan Note (Signed)
SNF - appears stable;will cont zyprexa

## 2015-05-01 NOTE — Consult Note (Deleted)
Renal Service Consult Note [facility]  [name] Apr 26, 2015 [name] Requesting Physician:  Dr [name]  Reason for Consult:  ESRD patient w septic shock, resp failure and vfib arrest HPI: The patient is a 40 y.o. year-old with hx of HTN, SVT, FM/ IBS and developed SLE about 2 yrs ago which was a mild disease according to family until this summer when she developed renal failure.  Had biopsy proven SLE nephritis , treated w aggressive medical Rx but ended up on HD in Sept 2016. 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  Unclear source, broad spec abx were stopped/ completed but now is admitted w worsening diffuse ecchymotic rash w desquamation in progress, septic shock , lactic acidosis and resp failure admitted overnight last night.  This am had vfib arrest requiring 5 min CPR. Post -arrest is unresponsive w agonal type underlying breathing pattern.     ROS  n/a  Past Medical History  Past Medical History  Diagnosis Date  . Hypertension   . Cardiac arrhythmia   . SVT (supraventricular tachycardia) ([place])     "today, last week, 2 wk ago; maybe 1 month ago, etc; started w/in last 3-4 yrs"(07/20/2012)  . Fainting     "~ 1 month ago; probably related to SVT" (07/20/2012)  . Shortness of breath     "related to SVT episodes" (07/20/2012)  . Chest pain at rest     "related to SVT" (07/20/2012)  . Lupus (systemic lupus erythematosus) ([place])   . GERD (gastroesophageal reflux disease)   . Fibromyalgia   . Irritable bowel syndrome (IBS)   . Hypercholesterolemia   . Heart murmur      "small" (12/25/2014)  . TIA (transient ischemic attack) 12/21/2014  . Sleep apnea     "don't wear mask anymore" (12/25/2014)  . Anemia   . History of blood transfusion "several"    "related to low counts"  . Daily headache   . Arthritis     "hips" (12/25/2014)  . Anxiety     "sometimes; don't take anything for it" (12/25/2014)  . ESRD (end stage renal disease) on dialysis ([place]) since 12/07/2014    Diagnosed Aug 2014 w SLE nephritis DPGN by biopsy, rx cellcept/ steroids.  Repeat biopsy early 2016 membranous w/o activity so meds weaned off. Big flare Aug '16 creat 6, 3rd biopsy DPGN, meds resumed. Ended up starting HD Sept 2016  . Stroke ([place])   . History of vascular access device     2016: Sept 9  R IJ cath per IR.  Sept 20 not candidate for fistula due to disease veins, had LUA hybrid graft placed. Sept 21 - steal syndrome, LUA AVG ligated. Oct 7 - new left femoral Gore-tex loop graft per [facility]. Oct 29 - removal R IJ tunneled HD cath   Past Surgical History  Past Surgical History  Procedure Laterality Date  . Cesarean section  2001; 2010  . Cervical biopsy  2013  . Supraventricular tachycardia ablation  07/21/12     Dr. Cristopher Chen   . Peripheral vascular catheterization N/A 12/14/2014    Procedure: Upper Extremity Venography;  Surgeon: Eileen Dutch, MD;  Location: [place] [facility];  Service: Cardiovascular;  Laterality: N/A;  . Insertion hybrid anteriovenous gortex graft Left 12/18/2014    Procedure: INSERTION GORE HYBRID ARTERIOVENOUS GRAFT LEFT AXILLO-BRACHIAL.;  Surgeon: Eileen Mitchell, MD;  [facility];  Service: Vascular;  Laterality: Left;  . Ligation of arteriovenous  fistula Left 12/19/2014    Procedure: LIGATION OF FISTULA;  Surgeon: Eileen Dutch, MD;  Location: [place];  Service: Vascular;  Laterality: Left;  . Appendectomy  2011  . Tubal ligation  2010  . Tee without cardioversion N/A 12/25/2014    Procedure: TRANSESOPHAGEAL ECHOCARDIOGRAM (TEE);  Surgeon: Eileen Margarita, MD;  Location: [place];  Service: Cardiovascular;  Laterality: N/A;  . Av fistula placement Left 01/04/2015    Procedure: INSERTION OF ARTERIOVENOUS (AV) GORE-TEX GRAFT THIGH;  Surgeon: Rosetta Posner, MD;  Location: [facility];  Service: Vascular;  Laterality: Left;  . Tee without cardioversion N/A 01/30/2015    Procedure: TRANSESOPHAGEAL ECHOCARDIOGRAM (TEE);  Surgeon: Eileen Perla, MD;  Location: [facility];  Service: Cardiovascular;  Laterality: N/A;   Family History  Family History  Problem Relation Age of Onset  . Cancer Mother     breast and ovarian  . BRCA 1/2 Sister    Social History  reports that she has never smoked. She has never used smokeless tobacco. She reports that she does not drink alcohol or use illicit drugs. Allergies  Allergies  Allergen Reactions  . Food Swelling    Red peppers   Home medications Prior to Admission medications   Medication Sig Start Date End Date Taking? Authorizing Provider  Amino Acids-Protein Hydrolys (FEEDING SUPPLEMENT, PRO-STAT SUGAR FREE 64,) LIQD Take 30 mLs by mouth daily. 03/22/15  Yes Jule Ser, DO  atorvastatin (LIPITOR) 20 MG tablet Take 1 tablet (20 mg total) by mouth daily at 6 PM. 01/17/15  Yes Lavon Paganini Angiulli, PA-C  Calcium Carbonate Antacid (TUMS PO) Take 1-2 tablets by mouth daily as needed (heartburn).   Yes Historical Provider, MD  cyclobenzaprine (FLEXERIL) 10 MG tablet Take 1 tablet (10 mg total) by mouth at bedtime as needed for muscle spasms. 01/17/15  Yes Daniel J Angiulli, PA-C  DULoxetine 40 MG CPEP Take 40 mg by mouth daily. 03/22/15  Yes Jule Ser, DO  hydroxychloroquine (PLAQUENIL) 200 MG tablet Take 2 tablets (400 mg total) by mouth daily. 01/17/15  Yes Daniel J Angiulli, PA-C  metoprolol tartrate (LOPRESSOR) 25 MG tablet Take 12.5 mg by mouth every morning.   Yes Historical Provider, MD  midodrine (PROAMATINE) 5 MG tablet Take 1 tablet (5 mg total) by mouth 2 (two) times daily with a meal.  03/22/15  Yes Jule Ser, DO  morphine (ROXANOL) 20 MG/ML concentrated solution Take 5 mg by mouth every 4 (four) hours as needed for severe pain.   Yes Historical Provider, MD  Multiple Vitamin (MULTIVITAMIN WITH MINERALS) TABS tablet Take 1 tablet by mouth daily.   Yes Historical Provider, MD  OLANZapine zydis (ZYPREXA) 10 MG disintegrating tablet Take 1 tablet (10 mg total) by mouth 2 (two) times daily after a meal. 03/22/15  Yes Jule Ser, DO  oxyCODONE-acetaminophen (PERCOCET/ROXICET) 5-325 MG tablet Take 1 tablet by mouth every 6 (six) hours as needed for severe pain. 03/22/15  Yes Jule Ser, DO  pantoprazole (PROTONIX) 20 MG tablet Take 1 tablet (20 mg total) by mouth daily. 01/17/15  Yes Daniel J Angiulli, PA-C  polyethylene glycol (MIRALAX / GLYCOLAX) packet Take 17 g by mouth daily as  needed for mild constipation. 02/04/15  Yes Collier Salina, MD  trimethobenzamide (TIGAN) 300 MG capsule Take 1 capsule (300 mg total) by mouth every 8 (eight) hours as needed for nausea/vomiting. 02/04/15  Yes Collier Salina, MD   Liver Function Tests  Recent Labs Lab 04/02/15 1219 04/06/2015 2015 2015-04-10 0735  AST  --  60* 258*  ALT  --  36 52  ALKPHOS  --  178* 170*  BILITOT  --  1.3* 0.9  PROT  --  5.5* 4.2*  ALBUMIN 1.6* 1.4* <1.0*   No results for input(s): LIPASE, AMYLASE in the last 168 hours. CBC  Recent Labs Lab 04/02/15 1219  04/26/2015 2015 04/28/2015 2023 04/10/15 0437 04-10-15 0735  WBC 7.0  < > 7.3  --  4.4 10.2  NEUTROABS 6.3  --  5.9  --   --   --   HGB 9.4*  < > 8.8* 9.9* 9.7* 8.1*  HCT 29.0*  < > 28.9* 29.0* 30.7* 26.8*  MCV 93.9  < > 98.6  --  98.4 101.5*  PLT 37*  < > 41*  --  38* 50*  < > = values in this interval not displayed. Basic Metabolic Panel  Recent Labs Lab 04/02/15 1219 04/25/2015 2015 04/03/2015 2023 2015-04-10 0437 04/10/15 0735  NA 138 142 141 141 146*  K 3.7 4.1 3.8 3.6 5.6*  CL 99* 100* 103 106 105  CO2 23 16*  --  14* 14*   GLUCOSE 80 89 83 83 45*  BUN 18 23* 23* 23* 23*  CREATININE 3.99* 4.68* 4.30* 4.36* 4.15*  CALCIUM 10.4* 8.9  --  8.4* 8.3*  PHOS 1.3*  --   --  3.2 6.4*    Filed Vitals:   04/10/15 0645 04-10-15 0700 04-10-2015 0730 04-10-15 0800  BP: 119/35  144/115 78/49  Pulse:   144   Temp: 99.9 F (37.7 C) 99.9 F (37.7 C)  99.7 F (37.6 C)  TempSrc:      Resp: 28 28 15 16   Height:      Weight:      SpO2:   98%    Exam On vent unresponsive, shallow agonal resp pattern  Sclera anicteric, throat clear No jvd Chest coarse bilat RRR no rub or gallop Abd obese soft nondistended, no bs Skin diffuse ecchymotic rash w desquamation back,  bilat LE's , UE"s, torso MS no joint effusions Ext no sig LE edema Mild UE edema bilat Neuro is unresponsive, on vent  TTS [facility]  Assessment: 1 Shock r/o sepsis 2 Desquamating skin rash, severe 3 ESRD on HD 4 Vfib arrest 5 SLE on plaquenil 6 Hx psychosis on zyprexa 7 Chron hypotens on midodrine 8 Recurrent UTI's 9 Hx pseudo cath bacteremia 10 hx marantic endocarditis w CVA   Plan -  Family/ mother have decided to transition to comfort care later today after family/ children/ nieces/ nephews have had a chace to spend time in the room.   Have d/w primary team.     Kelly Splinter MD [place] pager [phone]    cell [phone] 04-10-15, 9:28 AM

## 2015-05-01 DEATH — deceased

## 2016-07-13 IMAGING — CT CT ABD-PELV W/O CM
2 of 4 series · 7 of 46 positions shown, 8 images · non-contrast
Comparison: CT the abdomen and pelvis 11/26/2014.

CLINICAL DATA: 38-year-old female with left-sided flank pain
following renal biopsy. Pain with urination. Hematuria. Left renal
biopsy performed on 09/03/2014, and right renal biopsy performed on
11/22/2014.

EXAM:
CT ABDOMEN AND PELVIS WITHOUT CONTRAST
TECHNIQUE: Multidetector CT imaging of the abdomen and pelvis was performed
following the standard protocol without IV contrast.

[Series 201: routine, idose (2) · axial · 0.98mm/px · z∈[+56,+411]mm · 4 of 95 slices shown, 5 images]
[im 12/95  soft-tissue]
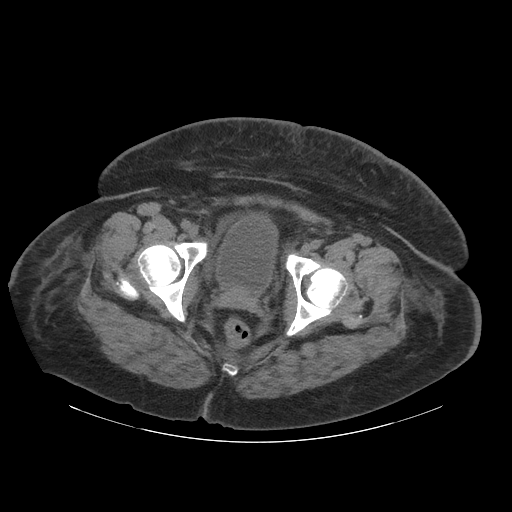
[im 12/95  bone]
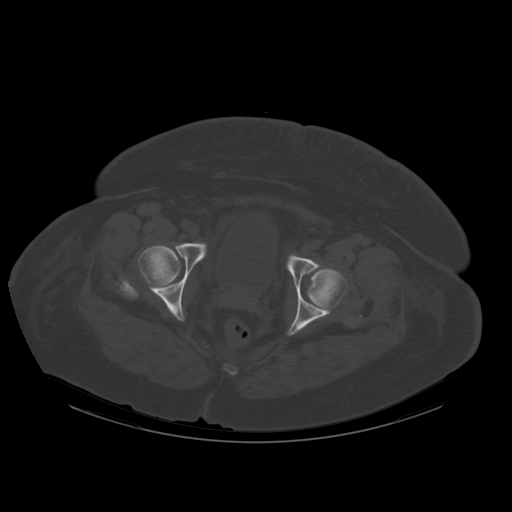
[im 36/95  soft-tissue]
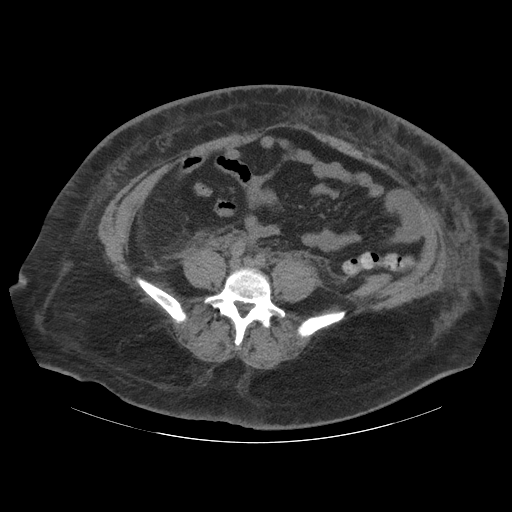
[im 59/95  soft-tissue]
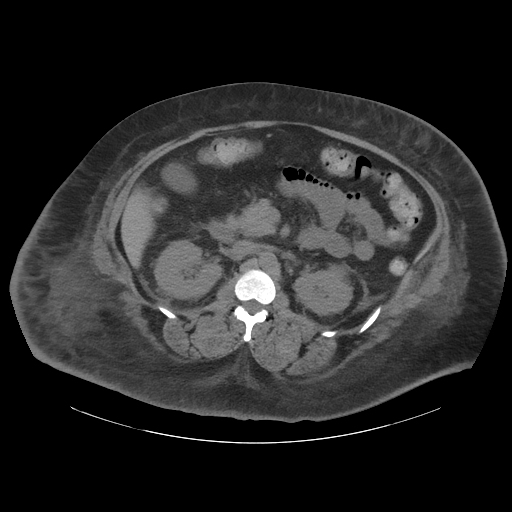
[im 83/95  soft-tissue]
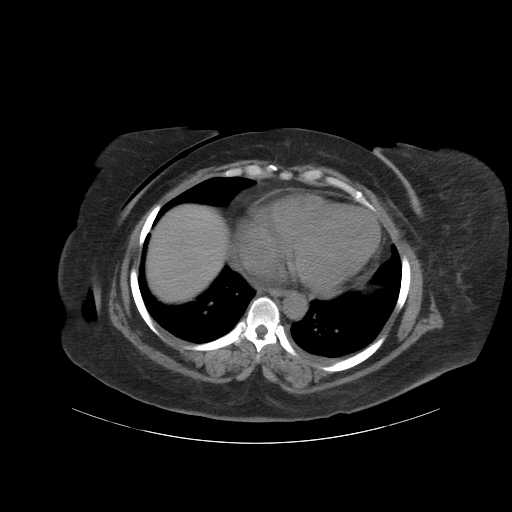

[Series 203: coronals, idose (2) · coronal · 0.45mm/px · 3 of 149 slices shown]
[im 50/149  soft-tissue]
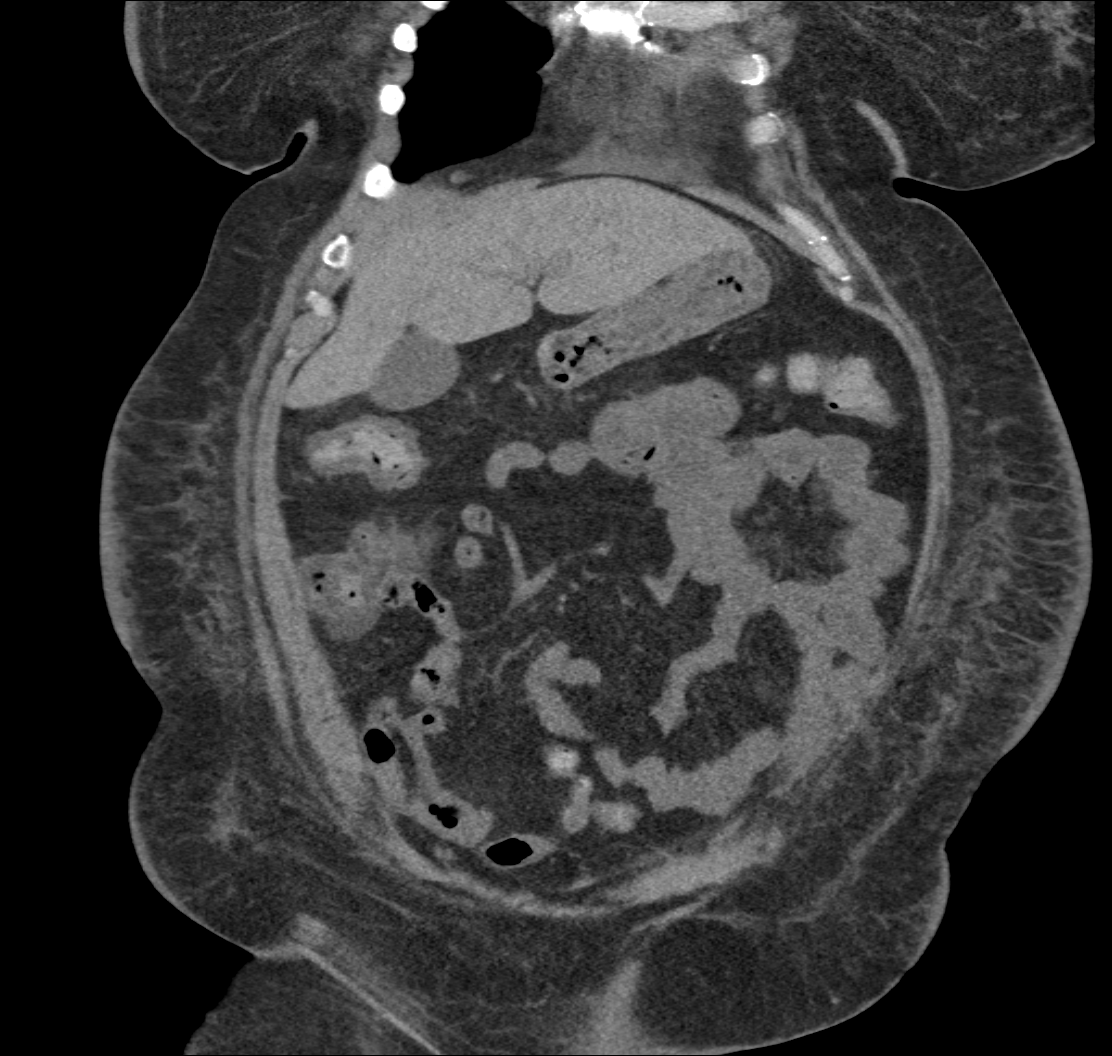
[im 66/149  soft-tissue]
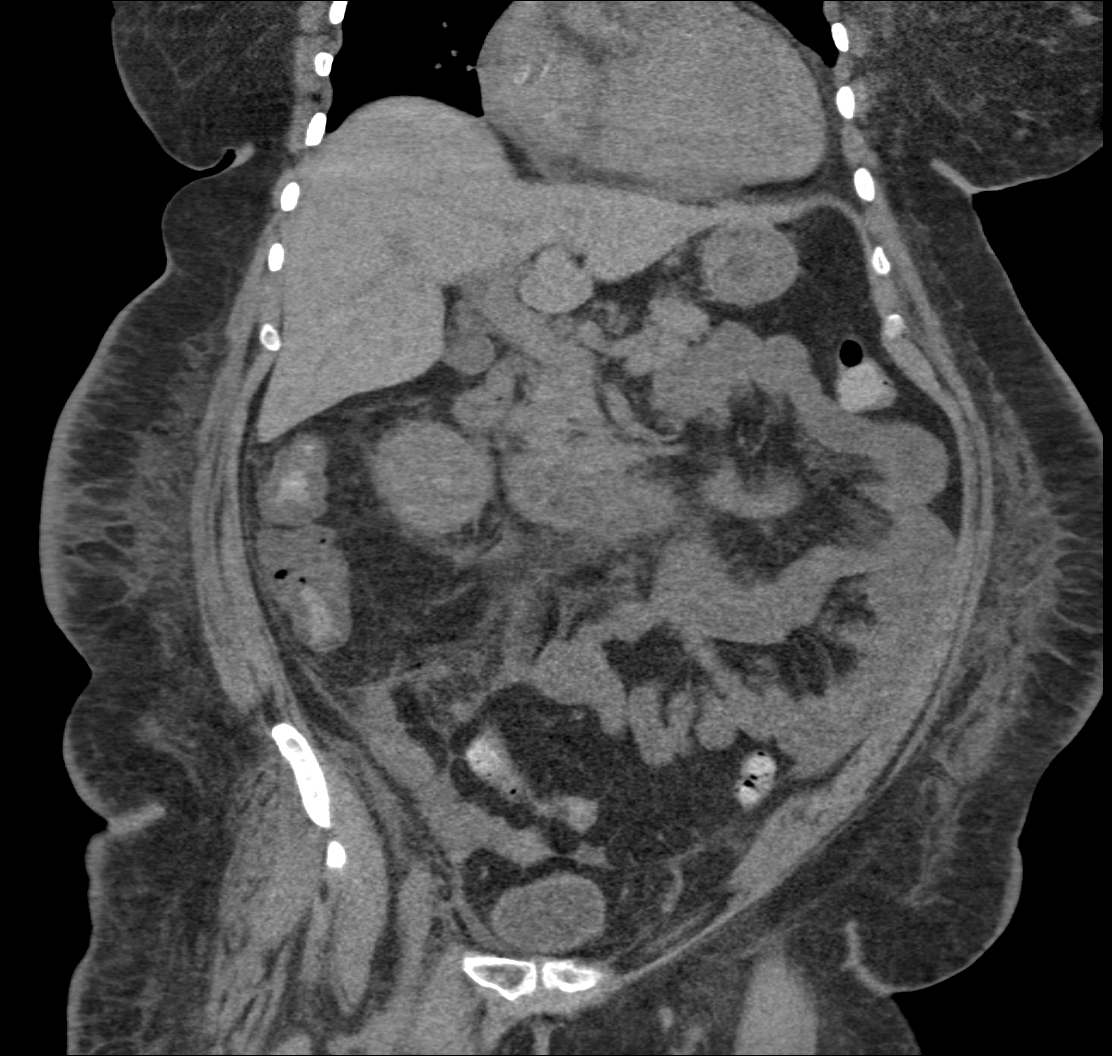
[im 83/149  soft-tissue]
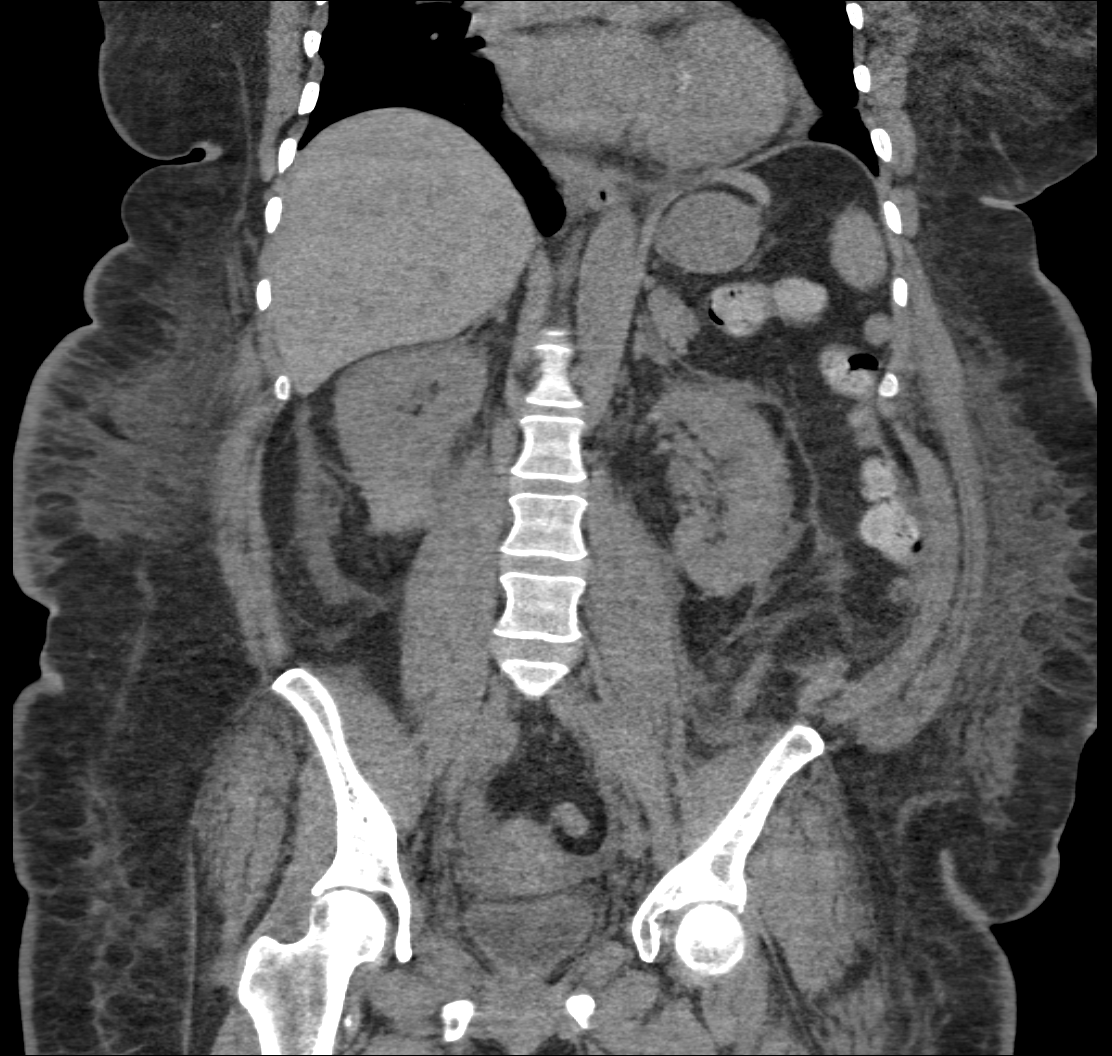

[7 of 46 positions shown; findings below may reference images not displayed]

FINDINGS: Lower chest: Central venous catheter terminating in the right
atrium. Minimal bibasilar subsegmental atelectasis. Mild
cardiomegaly.

Hepatobiliary: No discrete cystic or solid hepatic lesion identified
on today's noncontrast CT examination. The unenhanced appearance of
the gallbladder is normal.

Pancreas: No definite pancreatic mass or peripancreatic inflammatory
changes on today's noncontrast CT examination.

Spleen: Unremarkable.

Adrenals/Urinary Tract: Small amount of subcapsular high attenuation
fluid and gas in the lower pole of the right kidney posteriorly,
similar to the prior examination from 11/26/2014, measuring up to
3.9 x 1.7 cm on today's examination. The unenhanced appearance of
the left kidney is unremarkable. No calcifications identified within
the collecting system of either kidney, along the course of either
ureter or within the lumen of the urinary bladder. Bilateral
perinephric stranding and periureteric stranding is similar to prior
examination. Bilateral adrenal glands are normal in appearance.
Urinary bladder is unremarkable in appearance.

Stomach/Bowel: The unenhanced appearance of the stomach is normal.
No pathologic dilatation of small bowel or colon. Mild colonic wall
thickening involving the cecum, ascending colon and proximal
transverse colon, which may suggest a mild colitis. Status post
appendectomy.

Vascular/Lymphatic: No atherosclerotic calcifications or definite
aneurysm identified in the abdominal or pelvic vasculature. Numerous
prominent but nonenlarged retroperitoneal lymph nodes are similar to
the prior study, presumably reactive. No lymphadenopathy noted
elsewhere in the abdomen or pelvis.

Reproductive: Uterus and bilateral ovaries are unremarkable in
appearance.

Other: Small amount of high attenuation fluid and gas again noted in
the left retroperitoneum posterior to the left kidney, tracking down
toward the anatomic pelvis. This is very similar to the prior study,
measuring up to 4.8 x 1.6 cm on today's examination. Trace volume of
ascites. Small volume of retroperitoneal fluid, similar to the prior
examination. No pneumoperitoneum. Diffuse body wall edema.

Musculoskeletal: There are no aggressive appearing lytic or blastic
lesions noted in the visualized portions of the skeleton.
IMPRESSION: 1. Small chronic left retroperitoneal hematoma and chronic
subcapsular hematoma associated with the inferior pole of the right
kidney both appear very similar to the prior study. These both
contain small locules of gas, also similar to the prior examination.
No findings to suggest recurrent hemorrhage or overt findings to
suggest infection of either of these collections at this time.
2. Thickening of the cecum, ascending colon and proximal transverse
colon, suspicious for potential colitis. Clinical correlation is
recommended.
3. Chronic bilateral perinephric and periureteric stranding, similar
to the prior examination, potentially related to lupus nephritis.
4. Trace volume of ascites and diffuse body wall edema.
5. Mild cardiomegaly.
6. Additional incidental findings, as above.

## 2016-09-04 IMAGING — DX DG CHEST 1V PORT
1 series · 1 of 1 positions shown · non-contrast
Comparison: Chest radiograph performed 01/11/2015

CLINICAL DATA: Acute onset of fever.  Initial encounter.

EXAM:
PORTABLE CHEST 1 VIEW

[chest ap]
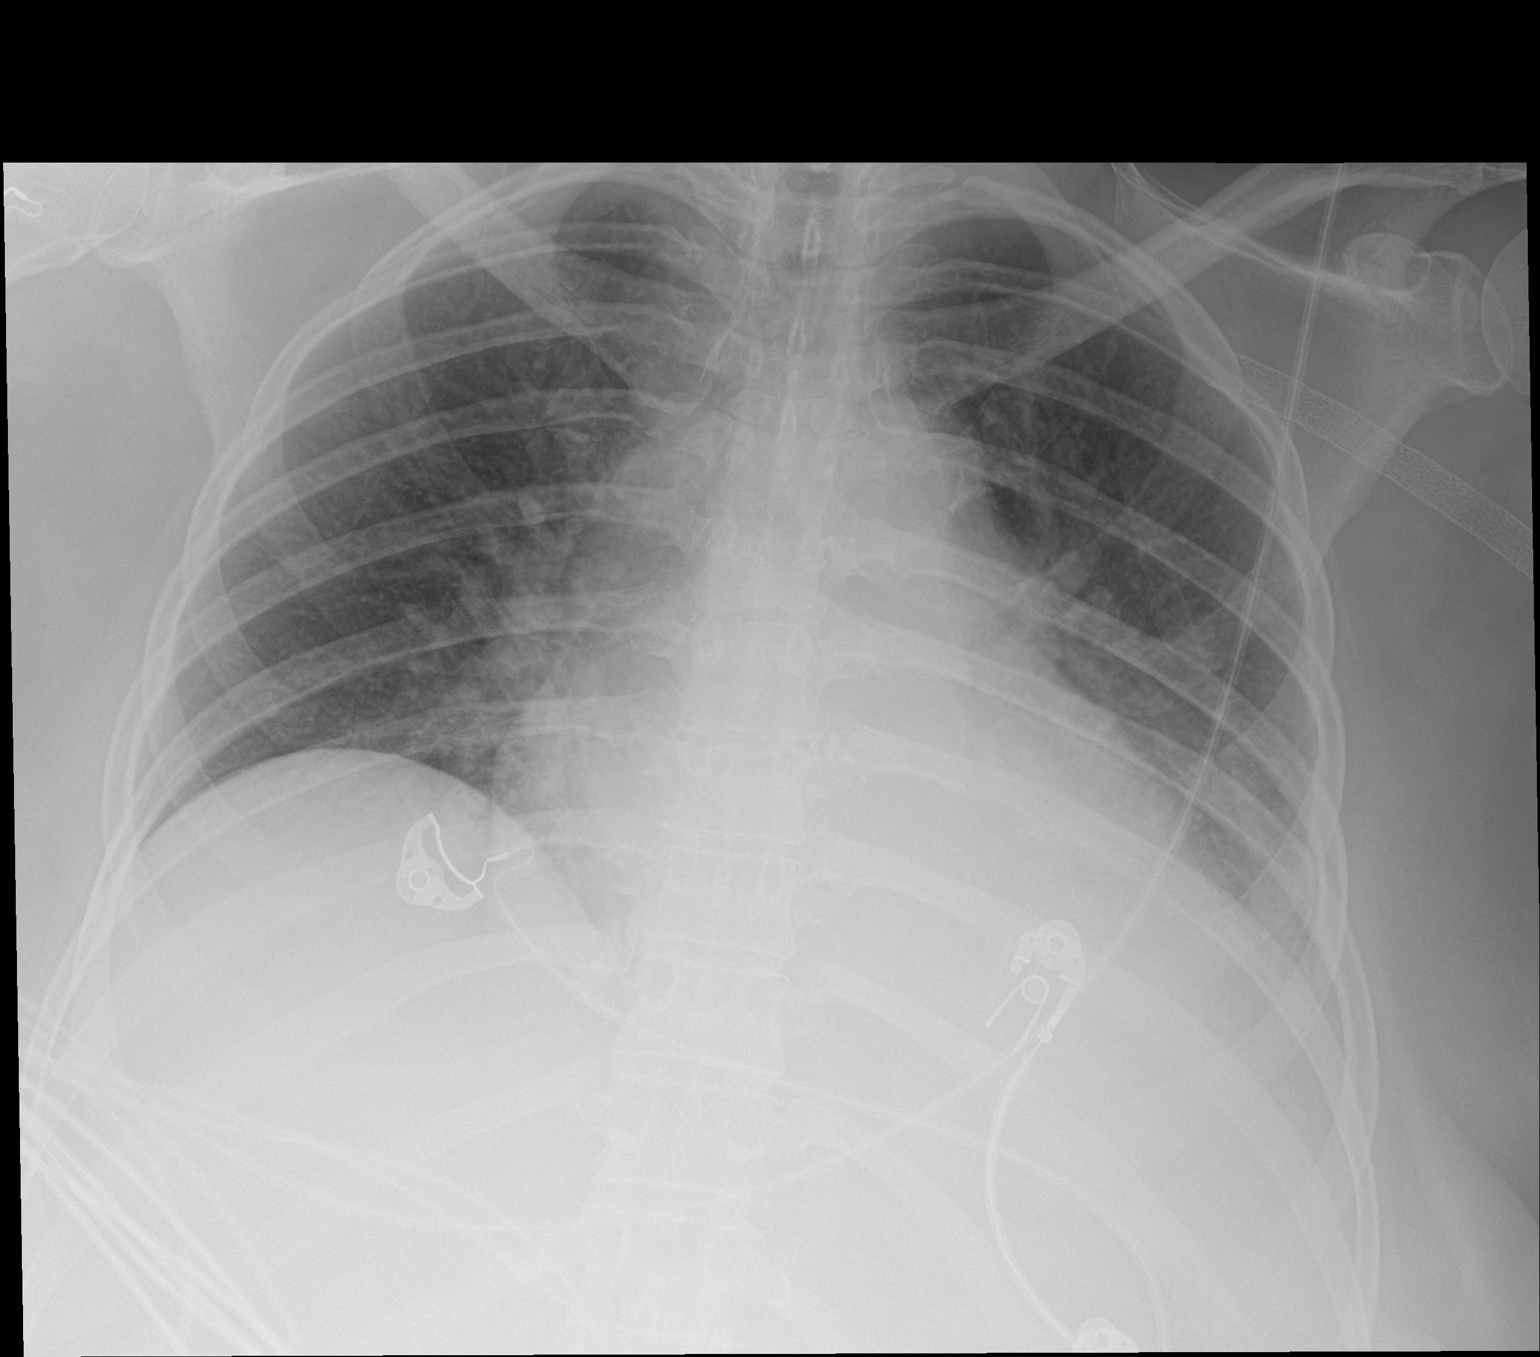

[1 of 1 positions shown; findings below may reference images not displayed]

FINDINGS: The lungs are hypoexpanded. A small left pleural effusion is noted,
with left basilar airspace opacity, which may reflect mild
pneumonia. There is no evidence of pneumothorax.

The cardiomediastinal silhouette is borderline enlarged. No acute
osseous abnormalities are seen.
IMPRESSION: Lungs hypoexpanded. Small left pleural effusion, with left basilar
airspace opacity, which may reflect mild pneumonia. Borderline
cardiomegaly.

## 2016-10-02 ENCOUNTER — Encounter (HOSPITAL_COMMUNITY): Payer: Self-pay | Admitting: Internal Medicine
